# Patient Record
Sex: Female | Born: 1975
Health system: Southern US, Community
[De-identification: ages and names within clinical notes are randomized; demographics above are authoritative.]

## PROBLEM LIST (undated history)

## (undated) DIAGNOSIS — F32A Depression, unspecified: Secondary | ICD-10-CM

## (undated) DIAGNOSIS — G809 Cerebral palsy, unspecified: Secondary | ICD-10-CM

## (undated) DIAGNOSIS — J189 Pneumonia, unspecified organism: Secondary | ICD-10-CM

## (undated) DIAGNOSIS — R159 Full incontinence of feces: Secondary | ICD-10-CM

## (undated) DIAGNOSIS — F99 Mental disorder, not otherwise specified: Secondary | ICD-10-CM

## (undated) DIAGNOSIS — F329 Major depressive disorder, single episode, unspecified: Secondary | ICD-10-CM

## (undated) DIAGNOSIS — D649 Anemia, unspecified: Secondary | ICD-10-CM

## (undated) DIAGNOSIS — Z8742 Personal history of other diseases of the female genital tract: Secondary | ICD-10-CM

## (undated) DIAGNOSIS — R471 Dysarthria and anarthria: Secondary | ICD-10-CM

## (undated) DIAGNOSIS — J918 Pleural effusion in other conditions classified elsewhere: Secondary | ICD-10-CM

## (undated) DIAGNOSIS — N369 Urethral disorder, unspecified: Secondary | ICD-10-CM

## (undated) DIAGNOSIS — J9601 Acute respiratory failure with hypoxia: Secondary | ICD-10-CM

## (undated) DIAGNOSIS — Z975 Presence of (intrauterine) contraceptive device: Secondary | ICD-10-CM

## (undated) DIAGNOSIS — L0390 Cellulitis, unspecified: Secondary | ICD-10-CM

## (undated) DIAGNOSIS — F429 Obsessive-compulsive disorder, unspecified: Secondary | ICD-10-CM

## (undated) DIAGNOSIS — M1990 Unspecified osteoarthritis, unspecified site: Secondary | ICD-10-CM

## (undated) DIAGNOSIS — I10 Essential (primary) hypertension: Secondary | ICD-10-CM

## (undated) DIAGNOSIS — R310 Gross hematuria: Secondary | ICD-10-CM

## (undated) DIAGNOSIS — Z87440 Personal history of urinary (tract) infections: Secondary | ICD-10-CM

## (undated) DIAGNOSIS — R569 Unspecified convulsions: Secondary | ICD-10-CM

## (undated) DIAGNOSIS — R252 Cramp and spasm: Secondary | ICD-10-CM

## (undated) DIAGNOSIS — K219 Gastro-esophageal reflux disease without esophagitis: Secondary | ICD-10-CM

## (undated) DIAGNOSIS — Z978 Presence of other specified devices: Secondary | ICD-10-CM

## (undated) DIAGNOSIS — R404 Transient alteration of awareness: Secondary | ICD-10-CM

## (undated) DIAGNOSIS — M2450 Contracture, unspecified joint: Secondary | ICD-10-CM

## (undated) DIAGNOSIS — I2699 Other pulmonary embolism without acute cor pulmonale: Secondary | ICD-10-CM

## (undated) DIAGNOSIS — A4189 Other specified sepsis: Secondary | ICD-10-CM

## (undated) HISTORY — DX: Personal history of other diseases of the female genital tract: Z87.42

## (undated) HISTORY — DX: Major depressive disorder, single episode, unspecified: F32.9

## (undated) HISTORY — DX: Pneumonia, unspecified organism: J18.9

## (undated) HISTORY — DX: Full incontinence of feces: R15.9

## (undated) HISTORY — PX: NEUROMA SURGERY: SHX722

## (undated) HISTORY — DX: Depression, unspecified: F32.A

## (undated) HISTORY — DX: Gastro-esophageal reflux disease without esophagitis: K21.9

## (undated) HISTORY — DX: Anemia, unspecified: D64.9

## (undated) HISTORY — DX: Essential (primary) hypertension: I10

## (undated) HISTORY — DX: Cellulitis, unspecified: L03.90

## (undated) HISTORY — PX: URETHRA SURGERY: SHX824

## (undated) HISTORY — DX: Urethral disorder, unspecified: N36.9

## (undated) HISTORY — DX: Dysarthria and anarthria: R47.1

## (undated) HISTORY — DX: Contracture, unspecified joint: M24.50

## (undated) HISTORY — DX: Personal history of urinary (tract) infections: Z87.440

## (undated) HISTORY — DX: Pleural effusion in other conditions classified elsewhere: J91.8

## (undated) HISTORY — DX: Other pulmonary embolism without acute cor pulmonale: I26.99

## (undated) HISTORY — DX: Presence of (intrauterine) contraceptive device: Z97.5

## (undated) HISTORY — PX: BACLOFEN PUMP REFILL: SHX1212

## (undated) HISTORY — DX: Unspecified osteoarthritis, unspecified site: M19.90

## (undated) HISTORY — PX: ABDOMINAL HYSTERECTOMY: SHX81

## (undated) HISTORY — PX: APPENDECTOMY: SHX54

## (undated) HISTORY — DX: Unspecified convulsions: R56.9

## (undated) HISTORY — DX: Transient alteration of awareness: R40.4

## (undated) HISTORY — DX: Mental disorder, not otherwise specified: F99

## (undated) HISTORY — DX: Obsessive-compulsive disorder, unspecified: F42.9

## (undated) HISTORY — DX: Gross hematuria: R31.0

## (undated) HISTORY — DX: Acute respiratory failure with hypoxia: J96.01

## (undated) HISTORY — DX: Cramp and spasm: R25.2

## (undated) HISTORY — DX: Presence of other specified devices: Z97.8

---

## 1998-03-02 ENCOUNTER — Encounter: Admission: RE | Admit: 1998-03-02 | Discharge: 1998-05-31 | Payer: Self-pay | Admitting: *Deleted

## 1998-03-20 ENCOUNTER — Encounter: Admission: RE | Admit: 1998-03-20 | Discharge: 1998-03-20 | Payer: Self-pay | Admitting: Family Medicine

## 1998-03-30 ENCOUNTER — Encounter: Admission: RE | Admit: 1998-03-30 | Discharge: 1998-03-30 | Payer: Self-pay | Admitting: Family Medicine

## 1998-04-25 ENCOUNTER — Encounter: Admission: RE | Admit: 1998-04-25 | Discharge: 1998-04-25 | Payer: Self-pay | Admitting: Sports Medicine

## 1998-05-25 ENCOUNTER — Encounter: Admission: RE | Admit: 1998-05-25 | Discharge: 1998-05-25 | Payer: Self-pay | Admitting: Family Medicine

## 1998-06-01 ENCOUNTER — Encounter: Admission: RE | Admit: 1998-06-01 | Discharge: 1998-06-01 | Payer: Self-pay | Admitting: Family Medicine

## 1998-06-27 ENCOUNTER — Encounter: Admission: RE | Admit: 1998-06-27 | Discharge: 1998-06-27 | Payer: Self-pay | Admitting: Sports Medicine

## 1998-07-19 ENCOUNTER — Encounter: Admission: RE | Admit: 1998-07-19 | Discharge: 1998-07-19 | Payer: Self-pay | Admitting: Family Medicine

## 1998-07-26 ENCOUNTER — Encounter: Admission: RE | Admit: 1998-07-26 | Discharge: 1998-07-26 | Payer: Self-pay | Admitting: Family Medicine

## 1998-07-27 ENCOUNTER — Encounter: Admission: RE | Admit: 1998-07-27 | Discharge: 1998-07-27 | Payer: Self-pay | Admitting: Family Medicine

## 1998-09-22 ENCOUNTER — Encounter: Admission: RE | Admit: 1998-09-22 | Discharge: 1998-09-22 | Payer: Self-pay | Admitting: Family Medicine

## 1998-10-02 ENCOUNTER — Encounter: Admission: RE | Admit: 1998-10-02 | Discharge: 1998-10-02 | Payer: Self-pay | Admitting: Family Medicine

## 1998-10-05 ENCOUNTER — Encounter: Admission: RE | Admit: 1998-10-05 | Discharge: 1998-10-05 | Payer: Self-pay | Admitting: Family Medicine

## 1998-10-24 ENCOUNTER — Encounter: Admission: RE | Admit: 1998-10-24 | Discharge: 1998-10-24 | Payer: Self-pay | Admitting: Sports Medicine

## 1998-11-13 ENCOUNTER — Encounter: Admission: RE | Admit: 1998-11-13 | Discharge: 1998-11-13 | Payer: Self-pay | Admitting: Family Medicine

## 1998-11-29 ENCOUNTER — Encounter: Admission: RE | Admit: 1998-11-29 | Discharge: 1998-11-29 | Payer: Self-pay | Admitting: Sports Medicine

## 1999-01-12 ENCOUNTER — Encounter: Admission: RE | Admit: 1999-01-12 | Discharge: 1999-01-12 | Payer: Self-pay | Admitting: Family Medicine

## 1999-02-05 ENCOUNTER — Encounter: Admission: RE | Admit: 1999-02-05 | Discharge: 1999-02-05 | Payer: Self-pay | Admitting: Family Medicine

## 1999-02-06 ENCOUNTER — Encounter: Admission: RE | Admit: 1999-02-06 | Discharge: 1999-02-06 | Payer: Self-pay | Admitting: Sports Medicine

## 1999-02-09 ENCOUNTER — Encounter: Admission: RE | Admit: 1999-02-09 | Discharge: 1999-02-09 | Payer: Self-pay | Admitting: Family Medicine

## 1999-02-27 ENCOUNTER — Encounter: Admission: RE | Admit: 1999-02-27 | Discharge: 1999-02-27 | Payer: Self-pay | Admitting: Family Medicine

## 1999-02-28 ENCOUNTER — Encounter: Admission: RE | Admit: 1999-02-28 | Discharge: 1999-02-28 | Payer: Self-pay | Admitting: Family Medicine

## 1999-03-16 ENCOUNTER — Encounter: Admission: RE | Admit: 1999-03-16 | Discharge: 1999-03-16 | Payer: Self-pay | Admitting: Sports Medicine

## 1999-04-19 ENCOUNTER — Encounter: Admission: RE | Admit: 1999-04-19 | Discharge: 1999-04-19 | Payer: Self-pay | Admitting: Family Medicine

## 1999-05-18 ENCOUNTER — Observation Stay (HOSPITAL_COMMUNITY): Admission: RE | Admit: 1999-05-18 | Discharge: 1999-05-19 | Payer: Self-pay | Admitting: Pediatrics

## 1999-05-31 ENCOUNTER — Encounter: Admission: RE | Admit: 1999-05-31 | Discharge: 1999-05-31 | Payer: Self-pay | Admitting: Sports Medicine

## 1999-06-01 ENCOUNTER — Encounter: Admission: RE | Admit: 1999-06-01 | Discharge: 1999-06-01 | Payer: Self-pay | Admitting: Family Medicine

## 1999-06-08 ENCOUNTER — Encounter: Admission: RE | Admit: 1999-06-08 | Discharge: 1999-06-08 | Payer: Self-pay | Admitting: Family Medicine

## 1999-06-11 ENCOUNTER — Encounter: Admission: RE | Admit: 1999-06-11 | Discharge: 1999-06-11 | Payer: Self-pay | Admitting: Family Medicine

## 1999-06-18 ENCOUNTER — Encounter: Admission: RE | Admit: 1999-06-18 | Discharge: 1999-06-18 | Payer: Self-pay | Admitting: Family Medicine

## 1999-07-06 ENCOUNTER — Encounter: Admission: RE | Admit: 1999-07-06 | Discharge: 1999-07-06 | Payer: Self-pay | Admitting: Family Medicine

## 1999-07-09 ENCOUNTER — Inpatient Hospital Stay (HOSPITAL_COMMUNITY): Admission: RE | Admit: 1999-07-09 | Discharge: 1999-07-14 | Payer: Self-pay | Admitting: Neurosurgery

## 1999-07-18 ENCOUNTER — Encounter: Payer: Self-pay | Admitting: Neurology

## 1999-07-18 ENCOUNTER — Ambulatory Visit (HOSPITAL_COMMUNITY): Admission: RE | Admit: 1999-07-18 | Discharge: 1999-07-18 | Payer: Self-pay | Admitting: Neurology

## 1999-07-24 ENCOUNTER — Encounter: Admission: RE | Admit: 1999-07-24 | Discharge: 1999-07-24 | Payer: Self-pay | Admitting: Family Medicine

## 1999-08-07 ENCOUNTER — Encounter: Admission: RE | Admit: 1999-08-07 | Discharge: 1999-08-07 | Payer: Self-pay | Admitting: Sports Medicine

## 1999-08-25 ENCOUNTER — Encounter: Payer: Self-pay | Admitting: *Deleted

## 1999-08-25 ENCOUNTER — Emergency Department (HOSPITAL_COMMUNITY): Admission: EM | Admit: 1999-08-25 | Discharge: 1999-08-25 | Payer: Self-pay | Admitting: Emergency Medicine

## 1999-09-04 ENCOUNTER — Encounter: Admission: RE | Admit: 1999-09-04 | Discharge: 1999-12-03 | Payer: Self-pay | Admitting: Pediatrics

## 1999-09-06 ENCOUNTER — Encounter: Admission: RE | Admit: 1999-09-06 | Discharge: 1999-09-06 | Payer: Self-pay | Admitting: Family Medicine

## 1999-09-12 ENCOUNTER — Other Ambulatory Visit: Admission: RE | Admit: 1999-09-12 | Discharge: 1999-09-12 | Payer: Self-pay | Admitting: Family Medicine

## 1999-09-12 ENCOUNTER — Encounter: Admission: RE | Admit: 1999-09-12 | Discharge: 1999-09-12 | Payer: Self-pay | Admitting: Family Medicine

## 1999-09-21 ENCOUNTER — Encounter: Admission: RE | Admit: 1999-09-21 | Discharge: 1999-09-21 | Payer: Self-pay | Admitting: Family Medicine

## 1999-09-28 ENCOUNTER — Encounter: Admission: RE | Admit: 1999-09-28 | Discharge: 1999-09-28 | Payer: Self-pay | Admitting: Sports Medicine

## 1999-10-16 ENCOUNTER — Encounter: Admission: RE | Admit: 1999-10-16 | Discharge: 1999-10-16 | Payer: Self-pay | Admitting: Family Medicine

## 1999-11-06 ENCOUNTER — Encounter: Admission: RE | Admit: 1999-11-06 | Discharge: 1999-11-06 | Payer: Self-pay | Admitting: Family Medicine

## 1999-11-29 ENCOUNTER — Ambulatory Visit (HOSPITAL_COMMUNITY): Admission: RE | Admit: 1999-11-29 | Discharge: 1999-11-29 | Payer: Self-pay | Admitting: Family Medicine

## 1999-11-29 ENCOUNTER — Encounter: Payer: Self-pay | Admitting: Family Medicine

## 1999-11-29 ENCOUNTER — Encounter: Admission: RE | Admit: 1999-11-29 | Discharge: 1999-11-29 | Payer: Self-pay | Admitting: Family Medicine

## 1999-11-29 ENCOUNTER — Inpatient Hospital Stay (HOSPITAL_COMMUNITY): Admission: EM | Admit: 1999-11-29 | Discharge: 1999-12-01 | Payer: Self-pay | Admitting: Emergency Medicine

## 1999-12-06 ENCOUNTER — Encounter: Admission: RE | Admit: 1999-12-06 | Discharge: 1999-12-06 | Payer: Self-pay | Admitting: Family Medicine

## 1999-12-14 ENCOUNTER — Encounter: Admission: RE | Admit: 1999-12-14 | Discharge: 1999-12-14 | Payer: Self-pay | Admitting: Family Medicine

## 1999-12-19 ENCOUNTER — Encounter: Admission: RE | Admit: 1999-12-19 | Discharge: 1999-12-19 | Payer: Self-pay | Admitting: Family Medicine

## 1999-12-19 ENCOUNTER — Encounter: Admission: RE | Admit: 1999-12-19 | Discharge: 1999-12-19 | Payer: Self-pay | Admitting: Sports Medicine

## 1999-12-19 ENCOUNTER — Encounter: Payer: Self-pay | Admitting: Sports Medicine

## 1999-12-26 ENCOUNTER — Encounter: Admission: RE | Admit: 1999-12-26 | Discharge: 1999-12-26 | Payer: Self-pay | Admitting: Family Medicine

## 2000-01-09 ENCOUNTER — Encounter: Admission: RE | Admit: 2000-01-09 | Discharge: 2000-01-09 | Payer: Self-pay | Admitting: Family Medicine

## 2000-01-16 ENCOUNTER — Encounter: Admission: RE | Admit: 2000-01-16 | Discharge: 2000-01-16 | Payer: Self-pay | Admitting: Family Medicine

## 2000-03-18 ENCOUNTER — Encounter: Admission: RE | Admit: 2000-03-18 | Discharge: 2000-03-18 | Payer: Self-pay | Admitting: Family Medicine

## 2000-03-25 ENCOUNTER — Ambulatory Visit (HOSPITAL_COMMUNITY): Admission: RE | Admit: 2000-03-25 | Discharge: 2000-03-25 | Payer: Self-pay | Admitting: Pediatrics

## 2000-03-25 ENCOUNTER — Encounter: Payer: Self-pay | Admitting: Pediatrics

## 2000-03-27 ENCOUNTER — Encounter: Admission: RE | Admit: 2000-03-27 | Discharge: 2000-03-27 | Payer: Self-pay | Admitting: Family Medicine

## 2000-03-28 ENCOUNTER — Encounter: Admission: RE | Admit: 2000-03-28 | Discharge: 2000-06-26 | Payer: Self-pay

## 2000-04-17 ENCOUNTER — Encounter: Admission: RE | Admit: 2000-04-17 | Discharge: 2000-04-17 | Payer: Self-pay | Admitting: Family Medicine

## 2000-05-01 ENCOUNTER — Encounter: Admission: RE | Admit: 2000-05-01 | Discharge: 2000-05-01 | Payer: Self-pay | Admitting: Family Medicine

## 2000-05-19 ENCOUNTER — Other Ambulatory Visit: Admission: RE | Admit: 2000-05-19 | Discharge: 2000-05-19 | Payer: Self-pay | Admitting: Sports Medicine

## 2000-05-19 ENCOUNTER — Encounter: Admission: RE | Admit: 2000-05-19 | Discharge: 2000-05-19 | Payer: Self-pay | Admitting: Family Medicine

## 2000-06-01 HISTORY — PX: COLPOSCOPY: SHX161

## 2000-06-26 ENCOUNTER — Other Ambulatory Visit: Admission: RE | Admit: 2000-06-26 | Discharge: 2000-06-26 | Payer: Self-pay | Admitting: Family Medicine

## 2000-06-26 ENCOUNTER — Encounter: Admission: RE | Admit: 2000-06-26 | Discharge: 2000-06-26 | Payer: Self-pay | Admitting: Family Medicine

## 2000-07-10 ENCOUNTER — Encounter: Admission: RE | Admit: 2000-07-10 | Discharge: 2000-07-10 | Payer: Self-pay | Admitting: Family Medicine

## 2000-07-15 ENCOUNTER — Encounter: Payer: Self-pay | Admitting: Family Medicine

## 2000-07-15 ENCOUNTER — Encounter: Admission: RE | Admit: 2000-07-15 | Discharge: 2000-07-15 | Payer: Self-pay | Admitting: Family Medicine

## 2000-08-06 ENCOUNTER — Encounter: Admission: RE | Admit: 2000-08-06 | Discharge: 2000-11-04 | Payer: Self-pay

## 2000-08-28 ENCOUNTER — Encounter: Admission: RE | Admit: 2000-08-28 | Discharge: 2000-08-28 | Payer: Self-pay | Admitting: Family Medicine

## 2000-09-23 ENCOUNTER — Encounter: Admission: RE | Admit: 2000-09-23 | Discharge: 2000-09-23 | Payer: Self-pay | Admitting: Family Medicine

## 2000-10-06 ENCOUNTER — Encounter: Admission: RE | Admit: 2000-10-06 | Discharge: 2000-10-06 | Payer: Self-pay | Admitting: Family Medicine

## 2000-10-08 ENCOUNTER — Encounter: Admission: RE | Admit: 2000-10-08 | Discharge: 2000-10-08 | Payer: Self-pay | Admitting: Family Medicine

## 2000-11-11 ENCOUNTER — Encounter: Admission: RE | Admit: 2000-11-11 | Discharge: 2000-11-11 | Payer: Self-pay | Admitting: Obstetrics & Gynecology

## 2000-12-03 ENCOUNTER — Encounter: Admission: RE | Admit: 2000-12-03 | Discharge: 2000-12-03 | Payer: Self-pay | Admitting: Family Medicine

## 2000-12-11 ENCOUNTER — Encounter: Payer: Self-pay | Admitting: Pediatrics

## 2000-12-11 ENCOUNTER — Ambulatory Visit (HOSPITAL_COMMUNITY): Admission: RE | Admit: 2000-12-11 | Discharge: 2000-12-11 | Payer: Self-pay | Admitting: Pediatrics

## 2000-12-15 ENCOUNTER — Ambulatory Visit (HOSPITAL_COMMUNITY): Admission: RE | Admit: 2000-12-15 | Discharge: 2000-12-15 | Payer: Self-pay | Admitting: Obstetrics

## 2000-12-22 ENCOUNTER — Encounter: Admission: RE | Admit: 2000-12-22 | Discharge: 2000-12-22 | Payer: Self-pay | Admitting: Family Medicine

## 2001-01-20 ENCOUNTER — Encounter: Admission: RE | Admit: 2001-01-20 | Discharge: 2001-01-20 | Payer: Self-pay | Admitting: Sports Medicine

## 2001-02-12 ENCOUNTER — Encounter: Admission: RE | Admit: 2001-02-12 | Discharge: 2001-02-12 | Payer: Self-pay | Admitting: Sports Medicine

## 2001-03-05 ENCOUNTER — Encounter: Admission: RE | Admit: 2001-03-05 | Discharge: 2001-03-05 | Payer: Self-pay | Admitting: Family Medicine

## 2001-03-12 ENCOUNTER — Encounter: Admission: RE | Admit: 2001-03-12 | Discharge: 2001-03-12 | Payer: Self-pay | Admitting: Family Medicine

## 2001-03-24 ENCOUNTER — Encounter: Admission: RE | Admit: 2001-03-24 | Discharge: 2001-03-24 | Payer: Self-pay | Admitting: Obstetrics & Gynecology

## 2001-03-27 ENCOUNTER — Encounter: Admission: RE | Admit: 2001-03-27 | Discharge: 2001-03-27 | Payer: Self-pay | Admitting: Obstetrics & Gynecology

## 2001-05-22 ENCOUNTER — Encounter: Admission: RE | Admit: 2001-05-22 | Discharge: 2001-05-22 | Payer: Self-pay | Admitting: Obstetrics & Gynecology

## 2001-06-19 ENCOUNTER — Encounter: Admission: RE | Admit: 2001-06-19 | Discharge: 2001-06-19 | Payer: Self-pay | Admitting: Obstetrics & Gynecology

## 2001-06-23 ENCOUNTER — Encounter: Admission: RE | Admit: 2001-06-23 | Discharge: 2001-06-23 | Payer: Self-pay | Admitting: Sports Medicine

## 2001-06-29 ENCOUNTER — Encounter: Admission: RE | Admit: 2001-06-29 | Discharge: 2001-06-29 | Payer: Self-pay | Admitting: Family Medicine

## 2001-07-03 ENCOUNTER — Encounter: Admission: RE | Admit: 2001-07-03 | Discharge: 2001-10-01 | Payer: Self-pay

## 2001-09-29 ENCOUNTER — Encounter: Admission: RE | Admit: 2001-09-29 | Discharge: 2001-09-29 | Payer: Self-pay | Admitting: Sports Medicine

## 2001-10-02 ENCOUNTER — Encounter: Admission: RE | Admit: 2001-10-02 | Discharge: 2001-10-02 | Payer: Self-pay | Admitting: Obstetrics & Gynecology

## 2001-10-22 ENCOUNTER — Encounter: Payer: Self-pay | Admitting: Family Medicine

## 2001-10-22 ENCOUNTER — Inpatient Hospital Stay (HOSPITAL_COMMUNITY): Admission: EM | Admit: 2001-10-22 | Discharge: 2001-10-24 | Payer: Self-pay | Admitting: Emergency Medicine

## 2001-10-27 ENCOUNTER — Encounter: Admission: RE | Admit: 2001-10-27 | Discharge: 2001-10-27 | Payer: Self-pay | Admitting: Family Medicine

## 2001-11-03 ENCOUNTER — Encounter: Admission: RE | Admit: 2001-11-03 | Discharge: 2001-11-03 | Payer: Self-pay | Admitting: Family Medicine

## 2001-11-20 ENCOUNTER — Encounter: Admission: RE | Admit: 2001-11-20 | Discharge: 2001-11-20 | Payer: Self-pay | Admitting: Family Medicine

## 2001-11-23 ENCOUNTER — Encounter: Admission: RE | Admit: 2001-11-23 | Discharge: 2001-11-23 | Payer: Self-pay | Admitting: Sports Medicine

## 2001-11-23 ENCOUNTER — Encounter: Admission: RE | Admit: 2001-11-23 | Discharge: 2001-11-23 | Payer: Self-pay | Admitting: Pediatrics

## 2001-11-23 ENCOUNTER — Encounter: Payer: Self-pay | Admitting: Pediatrics

## 2001-11-23 ENCOUNTER — Encounter: Payer: Self-pay | Admitting: Sports Medicine

## 2001-12-02 HISTORY — PX: TUBAL LIGATION: SHX77

## 2001-12-04 ENCOUNTER — Encounter: Admission: RE | Admit: 2001-12-04 | Discharge: 2001-12-04 | Payer: Self-pay | Admitting: Family Medicine

## 2001-12-07 ENCOUNTER — Encounter: Admission: RE | Admit: 2001-12-07 | Discharge: 2001-12-07 | Payer: Self-pay | Admitting: Sports Medicine

## 2001-12-31 ENCOUNTER — Encounter: Admission: RE | Admit: 2001-12-31 | Discharge: 2001-12-31 | Payer: Self-pay | Admitting: Sports Medicine

## 2002-01-26 ENCOUNTER — Encounter (INDEPENDENT_AMBULATORY_CARE_PROVIDER_SITE_OTHER): Payer: Self-pay | Admitting: *Deleted

## 2002-01-26 ENCOUNTER — Encounter: Admission: RE | Admit: 2002-01-26 | Discharge: 2002-01-26 | Payer: Self-pay | Admitting: Sports Medicine

## 2002-01-27 ENCOUNTER — Other Ambulatory Visit: Admission: RE | Admit: 2002-01-27 | Discharge: 2002-01-27 | Payer: Self-pay | Admitting: Obstetrics

## 2002-01-29 ENCOUNTER — Encounter: Admission: RE | Admit: 2002-01-29 | Discharge: 2002-01-29 | Payer: Self-pay | Admitting: Family Medicine

## 2002-02-10 ENCOUNTER — Encounter: Admission: RE | Admit: 2002-02-10 | Discharge: 2002-02-10 | Payer: Self-pay | Admitting: Family Medicine

## 2002-02-12 ENCOUNTER — Encounter: Admission: RE | Admit: 2002-02-12 | Discharge: 2002-02-12 | Payer: Self-pay | Admitting: Family Medicine

## 2002-02-19 ENCOUNTER — Ambulatory Visit (HOSPITAL_COMMUNITY): Admission: RE | Admit: 2002-02-19 | Discharge: 2002-02-19 | Payer: Self-pay | Admitting: Family Medicine

## 2002-03-04 ENCOUNTER — Encounter: Admission: RE | Admit: 2002-03-04 | Discharge: 2002-03-04 | Payer: Self-pay | Admitting: *Deleted

## 2002-03-24 ENCOUNTER — Encounter: Admission: RE | Admit: 2002-03-24 | Discharge: 2002-03-24 | Payer: Self-pay | Admitting: *Deleted

## 2002-04-07 ENCOUNTER — Encounter: Admission: RE | Admit: 2002-04-07 | Discharge: 2002-04-07 | Payer: Self-pay | Admitting: *Deleted

## 2002-04-21 ENCOUNTER — Encounter: Admission: RE | Admit: 2002-04-21 | Discharge: 2002-04-21 | Payer: Self-pay | Admitting: *Deleted

## 2002-04-27 ENCOUNTER — Inpatient Hospital Stay (HOSPITAL_COMMUNITY): Admission: AD | Admit: 2002-04-27 | Discharge: 2002-04-27 | Payer: Self-pay | Admitting: *Deleted

## 2002-05-05 ENCOUNTER — Encounter: Admission: RE | Admit: 2002-05-05 | Discharge: 2002-05-05 | Payer: Self-pay | Admitting: *Deleted

## 2002-05-11 ENCOUNTER — Ambulatory Visit (HOSPITAL_COMMUNITY): Admission: RE | Admit: 2002-05-11 | Discharge: 2002-05-11 | Payer: Self-pay | Admitting: *Deleted

## 2002-05-19 ENCOUNTER — Encounter: Admission: RE | Admit: 2002-05-19 | Discharge: 2002-05-19 | Payer: Self-pay | Admitting: *Deleted

## 2002-06-10 ENCOUNTER — Encounter: Admission: RE | Admit: 2002-06-10 | Discharge: 2002-06-10 | Payer: Self-pay | Admitting: *Deleted

## 2002-07-01 ENCOUNTER — Encounter: Admission: RE | Admit: 2002-07-01 | Discharge: 2002-07-01 | Payer: Self-pay | Admitting: *Deleted

## 2002-07-15 ENCOUNTER — Encounter (HOSPITAL_COMMUNITY): Admission: RE | Admit: 2002-07-15 | Discharge: 2002-08-14 | Payer: Self-pay | Admitting: *Deleted

## 2002-07-15 ENCOUNTER — Encounter: Admission: RE | Admit: 2002-07-15 | Discharge: 2002-07-15 | Payer: Self-pay | Admitting: *Deleted

## 2002-07-20 ENCOUNTER — Ambulatory Visit (HOSPITAL_COMMUNITY): Admission: RE | Admit: 2002-07-20 | Discharge: 2002-07-20 | Payer: Self-pay | Admitting: *Deleted

## 2002-07-22 ENCOUNTER — Encounter: Admission: RE | Admit: 2002-07-22 | Discharge: 2002-07-22 | Payer: Self-pay | Admitting: *Deleted

## 2002-07-28 ENCOUNTER — Encounter: Admission: RE | Admit: 2002-07-28 | Discharge: 2002-08-09 | Payer: Self-pay | Admitting: *Deleted

## 2002-07-29 ENCOUNTER — Encounter: Admission: RE | Admit: 2002-07-29 | Discharge: 2002-07-29 | Payer: Self-pay | Admitting: *Deleted

## 2002-08-04 ENCOUNTER — Ambulatory Visit (HOSPITAL_COMMUNITY): Admission: RE | Admit: 2002-08-04 | Discharge: 2002-08-04 | Payer: Self-pay | Admitting: *Deleted

## 2002-08-05 ENCOUNTER — Encounter: Admission: RE | Admit: 2002-08-05 | Discharge: 2002-08-05 | Payer: Self-pay | Admitting: *Deleted

## 2002-08-12 ENCOUNTER — Encounter: Admission: RE | Admit: 2002-08-12 | Discharge: 2002-08-12 | Payer: Self-pay | Admitting: *Deleted

## 2002-08-18 ENCOUNTER — Ambulatory Visit (HOSPITAL_COMMUNITY): Admission: RE | Admit: 2002-08-18 | Discharge: 2002-08-18 | Payer: Self-pay | Admitting: *Deleted

## 2002-08-19 ENCOUNTER — Encounter: Admission: RE | Admit: 2002-08-19 | Discharge: 2002-08-19 | Payer: Self-pay | Admitting: *Deleted

## 2002-08-20 ENCOUNTER — Encounter (HOSPITAL_COMMUNITY): Admission: AD | Admit: 2002-08-20 | Discharge: 2002-09-19 | Payer: Self-pay | Admitting: *Deleted

## 2002-08-25 ENCOUNTER — Encounter: Admission: RE | Admit: 2002-08-25 | Discharge: 2002-08-25 | Payer: Self-pay | Admitting: *Deleted

## 2002-09-01 ENCOUNTER — Encounter: Admission: RE | Admit: 2002-09-01 | Discharge: 2002-09-01 | Payer: Self-pay | Admitting: *Deleted

## 2002-09-03 ENCOUNTER — Encounter: Payer: Self-pay | Admitting: *Deleted

## 2002-09-15 ENCOUNTER — Encounter: Admission: RE | Admit: 2002-09-15 | Discharge: 2002-09-15 | Payer: Self-pay | Admitting: *Deleted

## 2002-09-21 ENCOUNTER — Inpatient Hospital Stay (HOSPITAL_COMMUNITY): Admission: RE | Admit: 2002-09-21 | Discharge: 2002-09-24 | Payer: Self-pay | Admitting: *Deleted

## 2002-09-21 ENCOUNTER — Encounter (INDEPENDENT_AMBULATORY_CARE_PROVIDER_SITE_OTHER): Payer: Self-pay | Admitting: Specialist

## 2002-09-21 ENCOUNTER — Encounter (INDEPENDENT_AMBULATORY_CARE_PROVIDER_SITE_OTHER): Payer: Self-pay

## 2002-11-03 ENCOUNTER — Encounter: Admission: RE | Admit: 2002-11-03 | Discharge: 2002-11-03 | Payer: Self-pay | Admitting: Family Medicine

## 2003-05-02 ENCOUNTER — Encounter: Admission: RE | Admit: 2003-05-02 | Discharge: 2003-05-02 | Payer: Self-pay | Admitting: Sports Medicine

## 2003-07-29 ENCOUNTER — Encounter: Admission: RE | Admit: 2003-07-29 | Discharge: 2003-07-29 | Payer: Self-pay | Admitting: Family Medicine

## 2003-08-05 ENCOUNTER — Encounter: Admission: RE | Admit: 2003-08-05 | Discharge: 2003-08-05 | Payer: Self-pay | Admitting: Family Medicine

## 2003-12-08 ENCOUNTER — Encounter: Admission: RE | Admit: 2003-12-08 | Discharge: 2003-12-08 | Payer: Self-pay | Admitting: Family Medicine

## 2003-12-29 ENCOUNTER — Emergency Department (HOSPITAL_COMMUNITY): Admission: AD | Admit: 2003-12-29 | Discharge: 2003-12-29 | Payer: Self-pay | Admitting: Family Medicine

## 2004-03-28 ENCOUNTER — Encounter: Admission: RE | Admit: 2004-03-28 | Discharge: 2004-03-28 | Payer: Self-pay | Admitting: Family Medicine

## 2004-04-20 ENCOUNTER — Encounter: Admission: RE | Admit: 2004-04-20 | Discharge: 2004-04-20 | Payer: Self-pay | Admitting: Sports Medicine

## 2004-05-03 ENCOUNTER — Emergency Department (HOSPITAL_COMMUNITY): Admission: EM | Admit: 2004-05-03 | Discharge: 2004-05-03 | Payer: Self-pay | Admitting: Family Medicine

## 2004-05-10 ENCOUNTER — Encounter: Admission: RE | Admit: 2004-05-10 | Discharge: 2004-05-10 | Payer: Self-pay | Admitting: Family Medicine

## 2004-07-23 ENCOUNTER — Encounter: Admission: RE | Admit: 2004-07-23 | Discharge: 2004-07-23 | Payer: Self-pay | Admitting: Family Medicine

## 2004-08-24 ENCOUNTER — Ambulatory Visit: Payer: Self-pay | Admitting: Family Medicine

## 2004-08-24 ENCOUNTER — Other Ambulatory Visit: Admission: RE | Admit: 2004-08-24 | Discharge: 2004-08-24 | Payer: Self-pay | Admitting: Family Medicine

## 2004-09-24 ENCOUNTER — Ambulatory Visit: Payer: Self-pay | Admitting: Family Medicine

## 2004-10-02 ENCOUNTER — Inpatient Hospital Stay (HOSPITAL_COMMUNITY): Admission: AD | Admit: 2004-10-02 | Discharge: 2004-10-10 | Payer: Self-pay | Admitting: Family Medicine

## 2004-10-02 ENCOUNTER — Ambulatory Visit: Payer: Self-pay | Admitting: Physical Medicine & Rehabilitation

## 2004-10-02 ENCOUNTER — Ambulatory Visit: Payer: Self-pay | Admitting: Family Medicine

## 2004-10-10 ENCOUNTER — Inpatient Hospital Stay (HOSPITAL_COMMUNITY)
Admission: RE | Admit: 2004-10-10 | Discharge: 2004-10-26 | Payer: Self-pay | Admitting: Physical Medicine & Rehabilitation

## 2004-11-06 ENCOUNTER — Ambulatory Visit: Payer: Self-pay | Admitting: Sports Medicine

## 2004-11-12 ENCOUNTER — Encounter
Admission: RE | Admit: 2004-11-12 | Discharge: 2005-02-10 | Payer: Self-pay | Admitting: Physical Medicine & Rehabilitation

## 2004-12-06 ENCOUNTER — Ambulatory Visit: Payer: Self-pay | Admitting: Family Medicine

## 2005-02-12 ENCOUNTER — Ambulatory Visit: Payer: Self-pay | Admitting: Sports Medicine

## 2005-02-12 ENCOUNTER — Ambulatory Visit: Payer: Self-pay | Admitting: Family Medicine

## 2005-02-12 ENCOUNTER — Inpatient Hospital Stay (HOSPITAL_COMMUNITY): Admission: AD | Admit: 2005-02-12 | Discharge: 2005-02-21 | Payer: Self-pay | Admitting: Sports Medicine

## 2005-02-27 ENCOUNTER — Ambulatory Visit: Payer: Self-pay | Admitting: Family Medicine

## 2005-03-05 ENCOUNTER — Ambulatory Visit: Payer: Self-pay | Admitting: Family Medicine

## 2005-03-18 ENCOUNTER — Encounter: Admission: RE | Admit: 2005-03-18 | Discharge: 2005-06-16 | Payer: Self-pay | Admitting: Family Medicine

## 2005-03-26 ENCOUNTER — Ambulatory Visit: Payer: Self-pay | Admitting: Sports Medicine

## 2005-05-27 ENCOUNTER — Ambulatory Visit: Payer: Self-pay | Admitting: Sports Medicine

## 2005-06-03 ENCOUNTER — Emergency Department (HOSPITAL_COMMUNITY): Admission: EM | Admit: 2005-06-03 | Discharge: 2005-06-03 | Payer: Self-pay | Admitting: Emergency Medicine

## 2005-06-06 ENCOUNTER — Ambulatory Visit: Payer: Self-pay | Admitting: Family Medicine

## 2005-06-10 ENCOUNTER — Ambulatory Visit: Payer: Self-pay | Admitting: Family Medicine

## 2005-06-10 ENCOUNTER — Inpatient Hospital Stay (HOSPITAL_COMMUNITY): Admission: AD | Admit: 2005-06-10 | Discharge: 2005-06-13 | Payer: Self-pay | Admitting: Family Medicine

## 2005-06-18 ENCOUNTER — Ambulatory Visit: Payer: Self-pay | Admitting: Family Medicine

## 2005-07-10 ENCOUNTER — Ambulatory Visit: Payer: Self-pay | Admitting: Family Medicine

## 2005-08-01 ENCOUNTER — Ambulatory Visit: Payer: Self-pay | Admitting: Family Medicine

## 2005-09-18 ENCOUNTER — Ambulatory Visit: Payer: Self-pay | Admitting: Family Medicine

## 2006-06-10 ENCOUNTER — Ambulatory Visit: Payer: Self-pay | Admitting: Sports Medicine

## 2006-07-23 ENCOUNTER — Ambulatory Visit: Payer: Self-pay | Admitting: Sports Medicine

## 2006-08-23 ENCOUNTER — Emergency Department (HOSPITAL_COMMUNITY): Admission: EM | Admit: 2006-08-23 | Discharge: 2006-08-24 | Payer: Self-pay | Admitting: Emergency Medicine

## 2006-08-27 ENCOUNTER — Ambulatory Visit (HOSPITAL_COMMUNITY): Admission: RE | Admit: 2006-08-27 | Discharge: 2006-08-27 | Payer: Self-pay | Admitting: Pediatrics

## 2006-09-08 ENCOUNTER — Inpatient Hospital Stay (HOSPITAL_COMMUNITY): Admission: AD | Admit: 2006-09-08 | Discharge: 2006-09-12 | Payer: Self-pay | Admitting: *Deleted

## 2006-09-08 ENCOUNTER — Ambulatory Visit: Payer: Self-pay | Admitting: Internal Medicine

## 2006-09-08 DIAGNOSIS — R404 Transient alteration of awareness: Secondary | ICD-10-CM

## 2006-09-08 HISTORY — DX: Transient alteration of awareness: R40.4

## 2006-09-11 ENCOUNTER — Encounter: Payer: Self-pay | Admitting: Cardiology

## 2006-09-29 ENCOUNTER — Ambulatory Visit: Payer: Self-pay | Admitting: Internal Medicine

## 2006-10-02 ENCOUNTER — Encounter (INDEPENDENT_AMBULATORY_CARE_PROVIDER_SITE_OTHER): Payer: Self-pay | Admitting: *Deleted

## 2006-10-31 ENCOUNTER — Ambulatory Visit: Payer: Self-pay | Admitting: Family Medicine

## 2006-10-31 ENCOUNTER — Other Ambulatory Visit: Admission: RE | Admit: 2006-10-31 | Discharge: 2006-10-31 | Payer: Self-pay | Admitting: Family Medicine

## 2006-11-04 ENCOUNTER — Emergency Department (HOSPITAL_COMMUNITY): Admission: EM | Admit: 2006-11-04 | Discharge: 2006-11-04 | Payer: Self-pay | Admitting: Emergency Medicine

## 2006-11-05 ENCOUNTER — Ambulatory Visit: Payer: Self-pay | Admitting: Family Medicine

## 2006-11-07 ENCOUNTER — Emergency Department (HOSPITAL_COMMUNITY): Admission: EM | Admit: 2006-11-07 | Discharge: 2006-11-07 | Payer: Self-pay | Admitting: Emergency Medicine

## 2006-11-11 ENCOUNTER — Ambulatory Visit: Payer: Self-pay | Admitting: Internal Medicine

## 2006-11-12 ENCOUNTER — Ambulatory Visit: Payer: Self-pay | Admitting: Psychiatry

## 2006-11-12 ENCOUNTER — Inpatient Hospital Stay (HOSPITAL_COMMUNITY): Admission: RE | Admit: 2006-11-12 | Discharge: 2006-11-21 | Payer: Self-pay | Admitting: Psychiatry

## 2006-12-29 ENCOUNTER — Ambulatory Visit: Payer: Self-pay | Admitting: Internal Medicine

## 2007-01-15 ENCOUNTER — Ambulatory Visit: Payer: Self-pay | Admitting: Internal Medicine

## 2007-01-21 ENCOUNTER — Ambulatory Visit: Payer: Self-pay | Admitting: Family Medicine

## 2007-01-29 ENCOUNTER — Ambulatory Visit: Payer: Self-pay | Admitting: Family Medicine

## 2007-01-29 ENCOUNTER — Encounter (INDEPENDENT_AMBULATORY_CARE_PROVIDER_SITE_OTHER): Payer: Self-pay | Admitting: Family Medicine

## 2007-01-29 DIAGNOSIS — F339 Major depressive disorder, recurrent, unspecified: Secondary | ICD-10-CM | POA: Insufficient documentation

## 2007-01-29 DIAGNOSIS — G808 Other cerebral palsy: Secondary | ICD-10-CM

## 2007-01-29 DIAGNOSIS — F428 Other obsessive-compulsive disorder: Secondary | ICD-10-CM

## 2007-01-29 DIAGNOSIS — F429 Obsessive-compulsive disorder, unspecified: Secondary | ICD-10-CM

## 2007-01-29 DIAGNOSIS — G809 Cerebral palsy, unspecified: Secondary | ICD-10-CM | POA: Insufficient documentation

## 2007-01-29 LAB — CONVERTED CEMR LAB
Cholesterol: 150 mg/dL (ref 0–200)
HDL: 41 mg/dL (ref >39)
Triglycerides: 83 mg/dL (ref <150)

## 2007-01-30 ENCOUNTER — Encounter (INDEPENDENT_AMBULATORY_CARE_PROVIDER_SITE_OTHER): Payer: Self-pay | Admitting: *Deleted

## 2007-02-04 ENCOUNTER — Encounter (INDEPENDENT_AMBULATORY_CARE_PROVIDER_SITE_OTHER): Payer: Self-pay | Admitting: Family Medicine

## 2007-04-28 ENCOUNTER — Telehealth (INDEPENDENT_AMBULATORY_CARE_PROVIDER_SITE_OTHER): Payer: Self-pay | Admitting: Family Medicine

## 2007-05-06 ENCOUNTER — Encounter: Payer: Self-pay | Admitting: *Deleted

## 2007-05-20 ENCOUNTER — Telehealth: Payer: Self-pay | Admitting: *Deleted

## 2007-05-21 ENCOUNTER — Ambulatory Visit: Payer: Self-pay | Admitting: Sports Medicine

## 2007-06-09 ENCOUNTER — Telehealth: Payer: Self-pay | Admitting: *Deleted

## 2007-06-10 ENCOUNTER — Ambulatory Visit: Payer: Self-pay | Admitting: Family Medicine

## 2007-06-15 ENCOUNTER — Inpatient Hospital Stay (HOSPITAL_COMMUNITY): Admission: RE | Admit: 2007-06-15 | Discharge: 2007-06-16 | Payer: Self-pay | Admitting: Neurosurgery

## 2007-08-27 ENCOUNTER — Telehealth (INDEPENDENT_AMBULATORY_CARE_PROVIDER_SITE_OTHER): Payer: Self-pay | Admitting: *Deleted

## 2007-08-28 ENCOUNTER — Ambulatory Visit: Payer: Self-pay | Admitting: Family Medicine

## 2007-08-28 ENCOUNTER — Encounter (INDEPENDENT_AMBULATORY_CARE_PROVIDER_SITE_OTHER): Payer: Self-pay | Admitting: Family Medicine

## 2007-08-28 LAB — CONVERTED CEMR LAB
Basophils Absolute: 0 10*3/uL (ref 0.0–0.1)
Basophils Relative: 0 % (ref 0–1)
Bilirubin Urine: NEGATIVE
Eosinophils Absolute: 0.2 10*3/uL (ref 0.0–0.7)
Glucose, Urine, Semiquant: NEGATIVE
Lymphs Abs: 2.3 10*3/uL (ref 0.7–3.3)
MCHC: 31.4 g/dL (ref 30.0–36.0)
MCV: 90 fL (ref 78.0–100.0)
Neutrophils Relative %: 49 % (ref 43–77)
Platelets: 253 10*3/uL (ref 150–400)
Protein, U semiquant: NEGATIVE
TSH: 0.695 microintl units/mL (ref 0.350–5.50)
WBC: 6 10*3/uL (ref 4.0–10.5)
pH: 6

## 2007-09-02 ENCOUNTER — Ambulatory Visit: Payer: Self-pay | Admitting: Family Medicine

## 2007-09-02 ENCOUNTER — Encounter (INDEPENDENT_AMBULATORY_CARE_PROVIDER_SITE_OTHER): Payer: Self-pay | Admitting: Family Medicine

## 2007-09-09 ENCOUNTER — Ambulatory Visit: Payer: Self-pay | Admitting: Family Medicine

## 2007-09-09 ENCOUNTER — Telehealth: Payer: Self-pay | Admitting: *Deleted

## 2007-10-13 ENCOUNTER — Emergency Department (HOSPITAL_COMMUNITY): Admission: EM | Admit: 2007-10-13 | Discharge: 2007-10-14 | Payer: Self-pay | Admitting: Emergency Medicine

## 2007-11-11 ENCOUNTER — Telehealth (INDEPENDENT_AMBULATORY_CARE_PROVIDER_SITE_OTHER): Payer: Self-pay | Admitting: Family Medicine

## 2007-12-02 ENCOUNTER — Ambulatory Visit (HOSPITAL_COMMUNITY): Admission: RE | Admit: 2007-12-02 | Discharge: 2007-12-02 | Payer: Self-pay | Admitting: Pediatrics

## 2007-12-07 ENCOUNTER — Inpatient Hospital Stay (HOSPITAL_COMMUNITY): Admission: RE | Admit: 2007-12-07 | Discharge: 2007-12-08 | Payer: Self-pay | Admitting: Neurosurgery

## 2007-12-15 ENCOUNTER — Telehealth: Payer: Self-pay | Admitting: *Deleted

## 2008-01-15 ENCOUNTER — Ambulatory Visit: Payer: Self-pay | Admitting: Family Medicine

## 2008-02-08 ENCOUNTER — Telehealth (INDEPENDENT_AMBULATORY_CARE_PROVIDER_SITE_OTHER): Payer: Self-pay | Admitting: Family Medicine

## 2008-02-08 ENCOUNTER — Ambulatory Visit: Payer: Self-pay | Admitting: Family Medicine

## 2008-02-08 LAB — CONVERTED CEMR LAB
Glucose, Urine, Semiquant: NEGATIVE
Specific Gravity, Urine: 1.01
WBC Urine, dipstick: NEGATIVE
pH: 7

## 2008-02-09 ENCOUNTER — Encounter (INDEPENDENT_AMBULATORY_CARE_PROVIDER_SITE_OTHER): Payer: Self-pay | Admitting: Family Medicine

## 2008-03-18 ENCOUNTER — Ambulatory Visit: Payer: Self-pay | Admitting: Family Medicine

## 2008-05-02 ENCOUNTER — Telehealth: Payer: Self-pay | Admitting: *Deleted

## 2008-05-02 ENCOUNTER — Ambulatory Visit: Payer: Self-pay | Admitting: Family Medicine

## 2008-05-17 ENCOUNTER — Ambulatory Visit: Payer: Self-pay | Admitting: Family Medicine

## 2008-05-18 ENCOUNTER — Telehealth (INDEPENDENT_AMBULATORY_CARE_PROVIDER_SITE_OTHER): Payer: Self-pay | Admitting: *Deleted

## 2008-05-19 ENCOUNTER — Encounter (INDEPENDENT_AMBULATORY_CARE_PROVIDER_SITE_OTHER): Payer: Self-pay | Admitting: Family Medicine

## 2008-06-01 ENCOUNTER — Ambulatory Visit: Payer: Self-pay | Admitting: Internal Medicine

## 2008-06-14 ENCOUNTER — Ambulatory Visit: Payer: Self-pay | Admitting: Family Medicine

## 2008-06-15 ENCOUNTER — Telehealth: Payer: Self-pay | Admitting: *Deleted

## 2008-06-16 ENCOUNTER — Ambulatory Visit: Payer: Self-pay | Admitting: Family Medicine

## 2008-06-16 ENCOUNTER — Telehealth (INDEPENDENT_AMBULATORY_CARE_PROVIDER_SITE_OTHER): Payer: Self-pay | Admitting: *Deleted

## 2008-06-21 ENCOUNTER — Encounter: Payer: Self-pay | Admitting: Family Medicine

## 2008-06-21 ENCOUNTER — Ambulatory Visit: Payer: Self-pay | Admitting: Family Medicine

## 2008-06-24 ENCOUNTER — Encounter: Payer: Self-pay | Admitting: Family Medicine

## 2008-06-24 ENCOUNTER — Ambulatory Visit (HOSPITAL_COMMUNITY): Admission: RE | Admit: 2008-06-24 | Discharge: 2008-06-24 | Payer: Self-pay | Admitting: Family Medicine

## 2008-07-06 ENCOUNTER — Ambulatory Visit: Payer: Self-pay | Admitting: Family Medicine

## 2008-07-20 ENCOUNTER — Encounter (INDEPENDENT_AMBULATORY_CARE_PROVIDER_SITE_OTHER): Payer: Self-pay | Admitting: Family Medicine

## 2008-07-20 ENCOUNTER — Ambulatory Visit: Payer: Self-pay | Admitting: Family Medicine

## 2008-07-20 ENCOUNTER — Telehealth: Payer: Self-pay | Admitting: *Deleted

## 2008-07-20 LAB — CONVERTED CEMR LAB
Nitrite: NEGATIVE
Specific Gravity, Urine: 1.015
WBC Urine, dipstick: NEGATIVE

## 2008-07-22 ENCOUNTER — Ambulatory Visit: Payer: Self-pay | Admitting: Family Medicine

## 2008-07-22 ENCOUNTER — Telehealth: Payer: Self-pay | Admitting: *Deleted

## 2008-07-25 ENCOUNTER — Ambulatory Visit: Payer: Self-pay | Admitting: Sports Medicine

## 2008-07-27 ENCOUNTER — Telehealth: Payer: Self-pay | Admitting: *Deleted

## 2008-08-02 HISTORY — PX: CARPAL TUNNEL RELEASE: SHX101

## 2008-08-05 ENCOUNTER — Ambulatory Visit: Payer: Self-pay | Admitting: Family Medicine

## 2008-08-05 ENCOUNTER — Encounter: Payer: Self-pay | Admitting: Family Medicine

## 2008-08-10 ENCOUNTER — Telehealth: Payer: Self-pay | Admitting: *Deleted

## 2008-08-11 ENCOUNTER — Ambulatory Visit: Payer: Self-pay | Admitting: Obstetrics & Gynecology

## 2008-08-11 ENCOUNTER — Telehealth: Payer: Self-pay | Admitting: *Deleted

## 2008-08-12 ENCOUNTER — Ambulatory Visit: Payer: Self-pay | Admitting: Family Medicine

## 2008-08-17 ENCOUNTER — Encounter: Payer: Self-pay | Admitting: Family Medicine

## 2008-09-01 ENCOUNTER — Ambulatory Visit (HOSPITAL_BASED_OUTPATIENT_CLINIC_OR_DEPARTMENT_OTHER): Admission: RE | Admit: 2008-09-01 | Discharge: 2008-09-01 | Payer: Self-pay | Admitting: Orthopedic Surgery

## 2008-09-14 ENCOUNTER — Telehealth: Payer: Self-pay | Admitting: Family Medicine

## 2008-09-19 ENCOUNTER — Telehealth: Payer: Self-pay | Admitting: Family Medicine

## 2008-09-20 ENCOUNTER — Encounter: Payer: Self-pay | Admitting: Family Medicine

## 2008-09-29 ENCOUNTER — Inpatient Hospital Stay (HOSPITAL_COMMUNITY): Admission: RE | Admit: 2008-09-29 | Discharge: 2008-10-03 | Payer: Self-pay | Admitting: Psychiatry

## 2008-09-29 ENCOUNTER — Ambulatory Visit: Payer: Self-pay | Admitting: Psychiatry

## 2008-09-29 ENCOUNTER — Telehealth: Payer: Self-pay | Admitting: Family Medicine

## 2008-10-05 ENCOUNTER — Encounter: Payer: Self-pay | Admitting: Family Medicine

## 2008-10-07 ENCOUNTER — Telehealth: Payer: Self-pay | Admitting: Family Medicine

## 2008-10-10 ENCOUNTER — Encounter: Payer: Self-pay | Admitting: *Deleted

## 2008-10-18 ENCOUNTER — Telehealth (INDEPENDENT_AMBULATORY_CARE_PROVIDER_SITE_OTHER): Payer: Self-pay | Admitting: *Deleted

## 2008-10-21 ENCOUNTER — Telehealth: Payer: Self-pay | Admitting: *Deleted

## 2008-10-24 ENCOUNTER — Ambulatory Visit: Payer: Self-pay | Admitting: Family Medicine

## 2008-11-04 ENCOUNTER — Ambulatory Visit: Payer: Self-pay | Admitting: Family Medicine

## 2008-12-26 ENCOUNTER — Telehealth: Payer: Self-pay | Admitting: Family Medicine

## 2008-12-27 ENCOUNTER — Ambulatory Visit: Payer: Self-pay | Admitting: Family Medicine

## 2008-12-27 DIAGNOSIS — M2450 Contracture, unspecified joint: Secondary | ICD-10-CM

## 2008-12-27 DIAGNOSIS — G801 Spastic diplegic cerebral palsy: Secondary | ICD-10-CM | POA: Insufficient documentation

## 2008-12-30 ENCOUNTER — Telehealth: Payer: Self-pay | Admitting: Family Medicine

## 2008-12-30 ENCOUNTER — Encounter: Payer: Self-pay | Admitting: Family Medicine

## 2008-12-30 ENCOUNTER — Ambulatory Visit: Payer: Self-pay | Admitting: Family Medicine

## 2008-12-30 LAB — CONVERTED CEMR LAB
Nitrite: NEGATIVE
Protein, U semiquant: NEGATIVE
Urobilinogen, UA: 0.2

## 2009-01-23 ENCOUNTER — Encounter: Admission: RE | Admit: 2009-01-23 | Discharge: 2009-03-15 | Payer: Self-pay | Admitting: Family Medicine

## 2009-01-25 ENCOUNTER — Ambulatory Visit: Payer: Self-pay | Admitting: Family Medicine

## 2009-02-22 ENCOUNTER — Encounter: Payer: Self-pay | Admitting: Family Medicine

## 2009-02-22 ENCOUNTER — Ambulatory Visit: Payer: Self-pay | Admitting: Family Medicine

## 2009-02-22 ENCOUNTER — Other Ambulatory Visit: Admission: RE | Admit: 2009-02-22 | Discharge: 2009-02-22 | Payer: Self-pay | Admitting: Family Medicine

## 2009-02-22 LAB — CONVERTED CEMR LAB
Bilirubin Urine: NEGATIVE
GC Probe Amp, Genital: NEGATIVE
Glucose, Urine, Semiquant: NEGATIVE
Protein, U semiquant: NEGATIVE
Specific Gravity, Urine: 1.01
pH: 5.5

## 2009-03-10 ENCOUNTER — Telehealth: Payer: Self-pay | Admitting: Family Medicine

## 2009-03-10 ENCOUNTER — Emergency Department (HOSPITAL_COMMUNITY): Admission: EM | Admit: 2009-03-10 | Discharge: 2009-03-10 | Payer: Self-pay | Admitting: Family Medicine

## 2009-03-14 ENCOUNTER — Telehealth: Payer: Self-pay | Admitting: Family Medicine

## 2009-03-15 ENCOUNTER — Ambulatory Visit: Payer: Self-pay | Admitting: Family Medicine

## 2009-03-15 ENCOUNTER — Encounter: Payer: Self-pay | Admitting: Family Medicine

## 2009-03-16 ENCOUNTER — Telehealth: Payer: Self-pay | Admitting: *Deleted

## 2009-03-27 ENCOUNTER — Ambulatory Visit: Payer: Self-pay | Admitting: Family Medicine

## 2009-04-13 ENCOUNTER — Telehealth: Payer: Self-pay | Admitting: *Deleted

## 2009-05-11 ENCOUNTER — Telehealth: Payer: Self-pay | Admitting: Family Medicine

## 2009-05-12 ENCOUNTER — Ambulatory Visit: Payer: Self-pay | Admitting: Family Medicine

## 2009-06-09 ENCOUNTER — Telehealth: Payer: Self-pay | Admitting: Family Medicine

## 2009-06-09 ENCOUNTER — Emergency Department (HOSPITAL_COMMUNITY): Admission: EM | Admit: 2009-06-09 | Discharge: 2009-06-09 | Payer: Self-pay | Admitting: Emergency Medicine

## 2009-06-14 ENCOUNTER — Ambulatory Visit: Payer: Self-pay | Admitting: Family Medicine

## 2009-06-15 ENCOUNTER — Encounter: Admission: RE | Admit: 2009-06-15 | Discharge: 2009-06-15 | Payer: Self-pay | Admitting: Sports Medicine

## 2009-06-22 ENCOUNTER — Telehealth: Payer: Self-pay | Admitting: Family Medicine

## 2009-06-26 ENCOUNTER — Telehealth: Payer: Self-pay | Admitting: Internal Medicine

## 2009-07-03 ENCOUNTER — Telehealth: Payer: Self-pay | Admitting: Family Medicine

## 2009-07-03 ENCOUNTER — Ambulatory Visit: Payer: Self-pay | Admitting: Family Medicine

## 2009-07-11 ENCOUNTER — Encounter: Payer: Self-pay | Admitting: Family Medicine

## 2009-07-11 LAB — CONVERTED CEMR LAB
Calcium: 9.3 mg/dL
HCT: 36.6 %
Hemoglobin: 12.4 g/dL
Platelets: 291 10*3/uL
Potassium: 3.9 meq/L
Sodium: 141 meq/L
WBC: 7.9 10*3/uL

## 2009-07-18 ENCOUNTER — Ambulatory Visit (HOSPITAL_COMMUNITY): Admission: RE | Admit: 2009-07-18 | Discharge: 2009-07-19 | Payer: Self-pay | Admitting: Oral Surgery

## 2009-07-25 ENCOUNTER — Ambulatory Visit: Payer: Self-pay | Admitting: Family Medicine

## 2009-07-25 ENCOUNTER — Encounter: Payer: Self-pay | Admitting: Family Medicine

## 2009-07-25 LAB — CONVERTED CEMR LAB
Blood in Urine, dipstick: NEGATIVE
CO2: 19 meq/L (ref 19–32)
Calcium: 9.3 mg/dL (ref 8.4–10.5)
Chloride: 107 meq/L (ref 96–112)
Glucose, Bld: 106 mg/dL — ABNORMAL HIGH (ref 70–99)
Ketones, urine, test strip: NEGATIVE
Nitrite: NEGATIVE
Potassium: 4 meq/L (ref 3.5–5.3)
Protein, U semiquant: NEGATIVE
Sodium: 140 meq/L (ref 135–145)
Specific Gravity, Urine: 1.025
Urobilinogen, UA: 2
WBC Urine, dipstick: NEGATIVE

## 2009-07-28 ENCOUNTER — Encounter: Payer: Self-pay | Admitting: Family Medicine

## 2009-07-31 ENCOUNTER — Telehealth: Payer: Self-pay | Admitting: Internal Medicine

## 2009-07-31 ENCOUNTER — Emergency Department (HOSPITAL_COMMUNITY): Admission: EM | Admit: 2009-07-31 | Discharge: 2009-07-31 | Payer: Self-pay | Admitting: Emergency Medicine

## 2009-08-01 ENCOUNTER — Telehealth: Payer: Self-pay | Admitting: Family Medicine

## 2009-08-02 ENCOUNTER — Encounter: Payer: Self-pay | Admitting: Family Medicine

## 2009-08-02 ENCOUNTER — Ambulatory Visit: Payer: Self-pay | Admitting: Family Medicine

## 2009-08-02 ENCOUNTER — Inpatient Hospital Stay (HOSPITAL_COMMUNITY): Admission: AD | Admit: 2009-08-02 | Discharge: 2009-08-05 | Payer: Self-pay | Admitting: Family Medicine

## 2009-08-03 ENCOUNTER — Ambulatory Visit: Payer: Self-pay | Admitting: Surgery

## 2009-08-07 ENCOUNTER — Telehealth: Payer: Self-pay | Admitting: Family Medicine

## 2009-08-08 ENCOUNTER — Telehealth: Payer: Self-pay | Admitting: Family Medicine

## 2009-08-09 ENCOUNTER — Ambulatory Visit: Payer: Self-pay | Admitting: Family Medicine

## 2009-08-10 ENCOUNTER — Telehealth: Payer: Self-pay | Admitting: *Deleted

## 2009-08-10 ENCOUNTER — Telehealth: Payer: Self-pay | Admitting: Family Medicine

## 2009-08-11 ENCOUNTER — Emergency Department (HOSPITAL_COMMUNITY): Admission: EM | Admit: 2009-08-11 | Discharge: 2009-08-11 | Payer: Self-pay | Admitting: Emergency Medicine

## 2009-08-11 ENCOUNTER — Encounter: Payer: Self-pay | Admitting: Family Medicine

## 2009-08-14 ENCOUNTER — Ambulatory Visit: Payer: Self-pay | Admitting: Family Medicine

## 2009-08-21 ENCOUNTER — Ambulatory Visit: Payer: Self-pay | Admitting: Family Medicine

## 2009-08-21 ENCOUNTER — Encounter: Payer: Self-pay | Admitting: Family Medicine

## 2009-08-24 ENCOUNTER — Ambulatory Visit: Payer: Self-pay | Admitting: Family Medicine

## 2009-08-24 ENCOUNTER — Encounter: Payer: Self-pay | Admitting: Family Medicine

## 2009-08-25 ENCOUNTER — Encounter: Payer: Self-pay | Admitting: Family Medicine

## 2009-08-25 ENCOUNTER — Observation Stay (HOSPITAL_COMMUNITY): Admission: EM | Admit: 2009-08-25 | Discharge: 2009-08-26 | Payer: Self-pay | Admitting: Emergency Medicine

## 2009-08-28 ENCOUNTER — Encounter: Payer: Self-pay | Admitting: *Deleted

## 2009-08-28 ENCOUNTER — Ambulatory Visit: Payer: Self-pay | Admitting: Family Medicine

## 2009-09-01 ENCOUNTER — Encounter: Payer: Self-pay | Admitting: Family Medicine

## 2009-09-08 ENCOUNTER — Telehealth: Payer: Self-pay | Admitting: Family Medicine

## 2009-09-08 ENCOUNTER — Ambulatory Visit: Payer: Self-pay | Admitting: Family Medicine

## 2009-09-11 ENCOUNTER — Ambulatory Visit: Payer: Self-pay | Admitting: Family Medicine

## 2009-09-11 LAB — CONVERTED CEMR LAB: INR: 2.3

## 2009-09-20 ENCOUNTER — Ambulatory Visit: Payer: Self-pay | Admitting: Family Medicine

## 2009-09-20 LAB — CONVERTED CEMR LAB: INR: 1.8

## 2009-09-22 ENCOUNTER — Telehealth: Payer: Self-pay | Admitting: *Deleted

## 2009-09-22 ENCOUNTER — Telehealth: Payer: Self-pay | Admitting: Family Medicine

## 2009-10-05 ENCOUNTER — Ambulatory Visit: Payer: Self-pay | Admitting: Family Medicine

## 2009-10-05 LAB — CONVERTED CEMR LAB: INR: 2.5

## 2009-10-13 ENCOUNTER — Encounter: Admission: RE | Admit: 2009-10-13 | Discharge: 2009-11-30 | Payer: Self-pay | Admitting: Family Medicine

## 2009-10-19 ENCOUNTER — Ambulatory Visit: Payer: Self-pay | Admitting: Family Medicine

## 2009-10-19 ENCOUNTER — Encounter: Payer: Self-pay | Admitting: Family Medicine

## 2009-10-23 ENCOUNTER — Encounter: Admission: RE | Admit: 2009-10-23 | Discharge: 2009-11-30 | Payer: Self-pay | Admitting: Family Medicine

## 2009-11-02 ENCOUNTER — Encounter: Payer: Self-pay | Admitting: Family Medicine

## 2009-11-09 ENCOUNTER — Telehealth: Payer: Self-pay | Admitting: Family Medicine

## 2009-11-09 ENCOUNTER — Ambulatory Visit: Payer: Self-pay | Admitting: Family Medicine

## 2009-11-09 LAB — CONVERTED CEMR LAB: INR: 1.6

## 2009-11-22 ENCOUNTER — Ambulatory Visit: Payer: Self-pay | Admitting: Family Medicine

## 2009-11-22 LAB — CONVERTED CEMR LAB: INR: 2.7

## 2009-12-06 ENCOUNTER — Ambulatory Visit: Payer: Self-pay | Admitting: Family Medicine

## 2009-12-06 ENCOUNTER — Telehealth: Payer: Self-pay | Admitting: Family Medicine

## 2009-12-12 ENCOUNTER — Encounter: Admission: RE | Admit: 2009-12-12 | Discharge: 2009-12-22 | Payer: Self-pay | Admitting: Family Medicine

## 2009-12-19 ENCOUNTER — Encounter: Payer: Self-pay | Admitting: Family Medicine

## 2009-12-20 ENCOUNTER — Ambulatory Visit: Payer: Self-pay | Admitting: Family Medicine

## 2009-12-20 ENCOUNTER — Encounter: Payer: Self-pay | Admitting: Family Medicine

## 2009-12-21 ENCOUNTER — Encounter: Payer: Self-pay | Admitting: Family Medicine

## 2010-01-02 ENCOUNTER — Telehealth: Payer: Self-pay | Admitting: *Deleted

## 2010-01-03 ENCOUNTER — Encounter: Payer: Self-pay | Admitting: Family Medicine

## 2010-01-03 ENCOUNTER — Ambulatory Visit: Payer: Self-pay | Admitting: Family Medicine

## 2010-01-03 LAB — CONVERTED CEMR LAB: INR: 2

## 2010-01-19 ENCOUNTER — Ambulatory Visit: Payer: Self-pay | Admitting: Family Medicine

## 2010-01-19 ENCOUNTER — Ambulatory Visit: Payer: Self-pay | Admitting: Sports Medicine

## 2010-01-22 ENCOUNTER — Emergency Department (HOSPITAL_COMMUNITY): Admission: EM | Admit: 2010-01-22 | Discharge: 2010-01-22 | Payer: Self-pay | Admitting: Emergency Medicine

## 2010-01-30 ENCOUNTER — Telehealth: Payer: Self-pay | Admitting: Internal Medicine

## 2010-01-31 ENCOUNTER — Ambulatory Visit: Payer: Self-pay | Admitting: Family Medicine

## 2010-01-31 ENCOUNTER — Ambulatory Visit: Payer: Self-pay | Admitting: Internal Medicine

## 2010-01-31 DIAGNOSIS — I10 Essential (primary) hypertension: Secondary | ICD-10-CM

## 2010-02-02 ENCOUNTER — Telehealth: Payer: Self-pay | Admitting: Internal Medicine

## 2010-02-06 ENCOUNTER — Telehealth (INDEPENDENT_AMBULATORY_CARE_PROVIDER_SITE_OTHER): Payer: Self-pay | Admitting: *Deleted

## 2010-02-07 ENCOUNTER — Ambulatory Visit: Payer: Self-pay | Admitting: Internal Medicine

## 2010-02-07 ENCOUNTER — Encounter (INDEPENDENT_AMBULATORY_CARE_PROVIDER_SITE_OTHER): Payer: Self-pay

## 2010-02-07 ENCOUNTER — Ambulatory Visit: Payer: Self-pay

## 2010-02-07 ENCOUNTER — Encounter (HOSPITAL_COMMUNITY): Admission: RE | Admit: 2010-02-07 | Discharge: 2010-04-03 | Payer: Self-pay | Admitting: Internal Medicine

## 2010-02-14 ENCOUNTER — Ambulatory Visit: Payer: Self-pay | Admitting: Family Medicine

## 2010-02-14 LAB — CONVERTED CEMR LAB: INR: 2.9

## 2010-02-15 ENCOUNTER — Telehealth: Payer: Self-pay | Admitting: Internal Medicine

## 2010-02-15 ENCOUNTER — Encounter: Payer: Self-pay | Admitting: Family Medicine

## 2010-02-17 ENCOUNTER — Encounter: Payer: Self-pay | Admitting: Family Medicine

## 2010-02-19 ENCOUNTER — Encounter: Payer: Self-pay | Admitting: Internal Medicine

## 2010-03-02 ENCOUNTER — Encounter: Payer: Self-pay | Admitting: Family Medicine

## 2010-03-08 ENCOUNTER — Ambulatory Visit: Payer: Self-pay | Admitting: Family Medicine

## 2010-03-08 LAB — CONVERTED CEMR LAB: INR: 1.4

## 2010-03-20 ENCOUNTER — Ambulatory Visit: Payer: Self-pay | Admitting: Sports Medicine

## 2010-03-20 DIAGNOSIS — M25539 Pain in unspecified wrist: Secondary | ICD-10-CM

## 2010-03-21 ENCOUNTER — Encounter: Admission: RE | Admit: 2010-03-21 | Discharge: 2010-03-21 | Payer: Self-pay | Admitting: Sports Medicine

## 2010-03-22 ENCOUNTER — Encounter: Payer: Self-pay | Admitting: *Deleted

## 2010-03-22 ENCOUNTER — Ambulatory Visit: Payer: Self-pay | Admitting: Family Medicine

## 2010-03-22 LAB — CONVERTED CEMR LAB: INR: 2.4

## 2010-03-28 ENCOUNTER — Telehealth: Payer: Self-pay | Admitting: *Deleted

## 2010-03-30 ENCOUNTER — Ambulatory Visit: Payer: Self-pay | Admitting: Family Medicine

## 2010-04-04 ENCOUNTER — Encounter: Payer: Self-pay | Admitting: Family Medicine

## 2010-04-06 ENCOUNTER — Telehealth: Payer: Self-pay | Admitting: Family Medicine

## 2010-04-11 ENCOUNTER — Encounter: Payer: Self-pay | Admitting: Family Medicine

## 2010-04-11 ENCOUNTER — Ambulatory Visit: Payer: Self-pay | Admitting: Family Medicine

## 2010-04-11 LAB — CONVERTED CEMR LAB
AST: 19 units/L (ref 0–37)
Albumin: 4.5 g/dL (ref 3.5–5.2)
Alkaline Phosphatase: 83 units/L (ref 39–117)
Calcium: 9.7 mg/dL (ref 8.4–10.5)
Chloride: 99 meq/L (ref 96–112)
Glucose, Bld: 89 mg/dL (ref 70–99)
Hemoglobin: 12 g/dL (ref 12.0–15.0)
MCHC: 31.8 g/dL (ref 30.0–36.0)
Potassium: 4.1 meq/L (ref 3.5–5.3)
RBC: 4.46 M/uL (ref 3.87–5.11)
RDW: 15 % (ref 11.5–15.5)
Sodium: 139 meq/L (ref 135–145)
Total Protein: 7 g/dL (ref 6.0–8.3)

## 2010-04-12 ENCOUNTER — Encounter: Payer: Self-pay | Admitting: *Deleted

## 2010-04-13 ENCOUNTER — Telehealth: Payer: Self-pay | Admitting: Family Medicine

## 2010-04-20 ENCOUNTER — Telehealth: Payer: Self-pay | Admitting: Family Medicine

## 2010-04-20 ENCOUNTER — Emergency Department (HOSPITAL_COMMUNITY): Admission: EM | Admit: 2010-04-20 | Discharge: 2010-04-20 | Payer: Self-pay | Admitting: Emergency Medicine

## 2010-04-23 ENCOUNTER — Encounter: Payer: Self-pay | Admitting: Family Medicine

## 2010-04-24 ENCOUNTER — Telehealth: Payer: Self-pay | Admitting: Family Medicine

## 2010-04-25 ENCOUNTER — Ambulatory Visit: Payer: Self-pay | Admitting: Family Medicine

## 2010-04-26 ENCOUNTER — Ambulatory Visit: Payer: Self-pay | Admitting: Family Medicine

## 2010-04-26 ENCOUNTER — Telehealth: Payer: Self-pay | Admitting: Family Medicine

## 2010-04-26 ENCOUNTER — Emergency Department (HOSPITAL_COMMUNITY): Admission: EM | Admit: 2010-04-26 | Discharge: 2010-04-26 | Payer: Self-pay | Admitting: Emergency Medicine

## 2010-04-27 ENCOUNTER — Ambulatory Visit: Payer: Self-pay | Admitting: Family Medicine

## 2010-05-01 ENCOUNTER — Ambulatory Visit: Payer: Self-pay | Admitting: Family Medicine

## 2010-05-01 ENCOUNTER — Ambulatory Visit (HOSPITAL_COMMUNITY): Admission: RE | Admit: 2010-05-01 | Discharge: 2010-05-01 | Payer: Self-pay | Admitting: Family Medicine

## 2010-05-02 ENCOUNTER — Encounter: Payer: Self-pay | Admitting: Family Medicine

## 2010-05-03 ENCOUNTER — Ambulatory Visit (HOSPITAL_COMMUNITY): Admission: RE | Admit: 2010-05-03 | Discharge: 2010-05-03 | Payer: Self-pay | Admitting: Pediatrics

## 2010-05-07 ENCOUNTER — Ambulatory Visit (HOSPITAL_COMMUNITY): Admission: RE | Admit: 2010-05-07 | Discharge: 2010-05-07 | Payer: Self-pay | Admitting: Pediatrics

## 2010-05-11 ENCOUNTER — Telehealth: Payer: Self-pay | Admitting: Family Medicine

## 2010-05-14 ENCOUNTER — Ambulatory Visit: Payer: Self-pay | Admitting: Family Medicine

## 2010-05-14 ENCOUNTER — Telehealth: Payer: Self-pay | Admitting: Family Medicine

## 2010-05-14 ENCOUNTER — Ambulatory Visit: Admission: RE | Admit: 2010-05-14 | Discharge: 2010-05-14 | Payer: Self-pay | Admitting: Family Medicine

## 2010-05-14 ENCOUNTER — Ambulatory Visit: Payer: Self-pay | Admitting: Surgery

## 2010-05-14 ENCOUNTER — Encounter: Payer: Self-pay | Admitting: Family Medicine

## 2010-05-16 ENCOUNTER — Ambulatory Visit (HOSPITAL_COMMUNITY): Admission: RE | Admit: 2010-05-16 | Discharge: 2010-05-16 | Payer: Self-pay | Admitting: Pediatrics

## 2010-05-16 ENCOUNTER — Encounter: Payer: Self-pay | Admitting: Family Medicine

## 2010-05-17 ENCOUNTER — Ambulatory Visit (HOSPITAL_COMMUNITY): Admission: RE | Admit: 2010-05-17 | Discharge: 2010-05-17 | Payer: Self-pay | Admitting: Family Medicine

## 2010-05-21 ENCOUNTER — Inpatient Hospital Stay (HOSPITAL_COMMUNITY): Admission: RE | Admit: 2010-05-21 | Discharge: 2010-05-22 | Payer: Self-pay | Admitting: Neurosurgery

## 2010-05-22 ENCOUNTER — Telehealth: Payer: Self-pay | Admitting: Family Medicine

## 2010-05-25 ENCOUNTER — Telehealth: Payer: Self-pay | Admitting: *Deleted

## 2010-06-01 HISTORY — PX: WRIST SURGERY: SHX841

## 2010-06-07 ENCOUNTER — Telehealth: Payer: Self-pay | Admitting: Family Medicine

## 2010-06-07 ENCOUNTER — Ambulatory Visit: Payer: Self-pay | Admitting: Family Medicine

## 2010-06-07 LAB — CONVERTED CEMR LAB
Nitrite: NEGATIVE
Specific Gravity, Urine: 1.015
Urobilinogen, UA: 0.2
WBC Urine, dipstick: NEGATIVE

## 2010-06-18 ENCOUNTER — Encounter: Payer: Self-pay | Admitting: Family Medicine

## 2010-06-20 ENCOUNTER — Encounter: Payer: Self-pay | Admitting: Family Medicine

## 2010-06-21 ENCOUNTER — Ambulatory Visit: Payer: Self-pay | Admitting: Family Medicine

## 2010-07-11 ENCOUNTER — Telehealth: Payer: Self-pay | Admitting: Family Medicine

## 2010-07-19 ENCOUNTER — Telehealth: Payer: Self-pay | Admitting: Family Medicine

## 2010-07-25 ENCOUNTER — Ambulatory Visit: Payer: Self-pay | Admitting: Family Medicine

## 2010-07-25 ENCOUNTER — Encounter: Payer: Self-pay | Admitting: Family Medicine

## 2010-07-26 ENCOUNTER — Encounter: Payer: Self-pay | Admitting: Family Medicine

## 2010-08-08 ENCOUNTER — Emergency Department (HOSPITAL_COMMUNITY): Admission: EM | Admit: 2010-08-08 | Discharge: 2010-08-08 | Payer: Self-pay | Admitting: Family Medicine

## 2010-08-08 ENCOUNTER — Encounter: Payer: Self-pay | Admitting: Family Medicine

## 2010-08-09 ENCOUNTER — Ambulatory Visit: Payer: Self-pay | Admitting: Family Medicine

## 2010-08-09 ENCOUNTER — Telehealth: Payer: Self-pay | Admitting: Family Medicine

## 2010-08-13 ENCOUNTER — Emergency Department (HOSPITAL_COMMUNITY): Admission: EM | Admit: 2010-08-13 | Discharge: 2010-08-13 | Payer: Self-pay | Admitting: Emergency Medicine

## 2010-08-24 ENCOUNTER — Inpatient Hospital Stay (HOSPITAL_COMMUNITY): Admission: AD | Admit: 2010-08-24 | Discharge: 2010-08-27 | Payer: Self-pay | Admitting: Pediatrics

## 2010-09-06 ENCOUNTER — Telehealth: Payer: Self-pay | Admitting: Family Medicine

## 2010-09-07 ENCOUNTER — Ambulatory Visit: Payer: Self-pay | Admitting: Family Medicine

## 2010-09-10 ENCOUNTER — Telehealth: Payer: Self-pay | Admitting: Family Medicine

## 2010-09-10 ENCOUNTER — Emergency Department (HOSPITAL_COMMUNITY): Admission: EM | Admit: 2010-09-10 | Discharge: 2010-09-10 | Payer: Self-pay | Admitting: Family Medicine

## 2010-09-17 ENCOUNTER — Ambulatory Visit (HOSPITAL_COMMUNITY): Admission: RE | Admit: 2010-09-17 | Discharge: 2010-09-17 | Payer: Self-pay | Admitting: Family Medicine

## 2010-09-17 ENCOUNTER — Encounter: Payer: Self-pay | Admitting: Family Medicine

## 2010-09-17 ENCOUNTER — Ambulatory Visit: Payer: Self-pay | Admitting: Family Medicine

## 2010-09-17 LAB — CONVERTED CEMR LAB
ALT: 9 units/L (ref 0–35)
BUN: 11 mg/dL (ref 6–23)
Blood in Urine, dipstick: NEGATIVE
CO2: 25 meq/L (ref 19–32)
Calcium: 9.1 mg/dL (ref 8.4–10.5)
Chloride: 104 meq/L (ref 96–112)
Creatinine, Ser: 0.76 mg/dL (ref 0.40–1.20)
Eosinophils Relative: 1 % (ref 0–5)
Glucose, Bld: 95 mg/dL (ref 70–99)
HCT: 40.3 % (ref 36.0–46.0)
Lymphocytes Relative: 29 % (ref 12–46)
Lymphs Abs: 2.2 10*3/uL (ref 0.7–4.0)
Monocytes Relative: 9 % (ref 3–12)
Nitrite: NEGATIVE
Platelets: 276 10*3/uL (ref 150–400)
Protein, U semiquant: NEGATIVE
RBC: 4.89 M/uL (ref 3.87–5.11)
Urobilinogen, UA: 0.2
WBC Urine, dipstick: NEGATIVE
WBC: 7.9 10*3/uL (ref 4.0–10.5)
pH: 6

## 2010-09-18 ENCOUNTER — Encounter: Payer: Self-pay | Admitting: Family Medicine

## 2010-09-18 ENCOUNTER — Telehealth: Payer: Self-pay | Admitting: Family Medicine

## 2010-09-21 ENCOUNTER — Ambulatory Visit: Payer: Self-pay | Admitting: Family Medicine

## 2010-10-02 ENCOUNTER — Telehealth: Payer: Self-pay | Admitting: Family Medicine

## 2010-10-03 ENCOUNTER — Ambulatory Visit: Payer: Self-pay | Admitting: Family Medicine

## 2010-10-03 LAB — CONVERTED CEMR LAB
Beta hcg, urine, semiquantitative: NEGATIVE
Glucose, Urine, Semiquant: NEGATIVE
Ketones, urine, test strip: NEGATIVE
Nitrite: NEGATIVE
Specific Gravity, Urine: 1.015
pH: 6

## 2010-10-10 ENCOUNTER — Encounter: Payer: Self-pay | Admitting: Family Medicine

## 2010-10-18 ENCOUNTER — Telehealth: Payer: Self-pay | Admitting: Family Medicine

## 2010-10-19 ENCOUNTER — Emergency Department (HOSPITAL_COMMUNITY): Admission: EM | Admit: 2010-10-19 | Discharge: 2010-10-19 | Payer: Self-pay | Admitting: Family Medicine

## 2010-10-19 ENCOUNTER — Telehealth: Payer: Self-pay | Admitting: Family Medicine

## 2010-10-24 ENCOUNTER — Ambulatory Visit: Payer: Self-pay | Admitting: Family Medicine

## 2010-10-24 DIAGNOSIS — N949 Unspecified condition associated with female genital organs and menstrual cycle: Secondary | ICD-10-CM | POA: Insufficient documentation

## 2010-11-01 ENCOUNTER — Telehealth: Payer: Self-pay | Admitting: Family Medicine

## 2010-11-01 ENCOUNTER — Telehealth: Payer: Self-pay | Admitting: *Deleted

## 2010-11-02 ENCOUNTER — Ambulatory Visit (HOSPITAL_COMMUNITY)
Admission: RE | Admit: 2010-11-02 | Discharge: 2010-11-02 | Payer: Self-pay | Source: Home / Self Care | Admitting: Family Medicine

## 2010-11-02 ENCOUNTER — Telehealth (INDEPENDENT_AMBULATORY_CARE_PROVIDER_SITE_OTHER): Payer: Self-pay | Admitting: *Deleted

## 2010-11-03 ENCOUNTER — Emergency Department (HOSPITAL_COMMUNITY)
Admission: EM | Admit: 2010-11-03 | Discharge: 2010-11-03 | Payer: Self-pay | Source: Home / Self Care | Admitting: Emergency Medicine

## 2010-11-05 ENCOUNTER — Telehealth: Payer: Self-pay | Admitting: Family Medicine

## 2010-11-06 ENCOUNTER — Encounter: Payer: Self-pay | Admitting: *Deleted

## 2010-11-06 ENCOUNTER — Ambulatory Visit: Payer: Self-pay | Admitting: Family Medicine

## 2010-11-08 ENCOUNTER — Ambulatory Visit: Payer: Self-pay | Admitting: Family Medicine

## 2010-11-12 ENCOUNTER — Other Ambulatory Visit
Admission: RE | Admit: 2010-11-12 | Discharge: 2010-11-12 | Payer: Self-pay | Source: Home / Self Care | Admitting: Family Medicine

## 2010-11-12 ENCOUNTER — Ambulatory Visit: Payer: Self-pay | Admitting: Family Medicine

## 2010-11-15 ENCOUNTER — Encounter: Payer: Self-pay | Admitting: Family Medicine

## 2010-11-19 ENCOUNTER — Ambulatory Visit: Payer: Self-pay

## 2010-11-19 ENCOUNTER — Emergency Department (HOSPITAL_COMMUNITY)
Admission: EM | Admit: 2010-11-19 | Discharge: 2010-11-19 | Payer: Self-pay | Source: Home / Self Care | Admitting: Family Medicine

## 2010-11-27 ENCOUNTER — Telehealth: Payer: Self-pay | Admitting: Family Medicine

## 2010-12-07 ENCOUNTER — Emergency Department (HOSPITAL_COMMUNITY)
Admission: EM | Admit: 2010-12-07 | Discharge: 2010-12-07 | Payer: Self-pay | Source: Home / Self Care | Admitting: Family Medicine

## 2010-12-13 ENCOUNTER — Ambulatory Visit
Admission: RE | Admit: 2010-12-13 | Discharge: 2010-12-13 | Payer: Self-pay | Source: Home / Self Care | Attending: Obstetrics and Gynecology | Admitting: Obstetrics and Gynecology

## 2010-12-18 ENCOUNTER — Encounter: Payer: Self-pay | Admitting: Family Medicine

## 2010-12-18 ENCOUNTER — Ambulatory Visit
Admission: RE | Admit: 2010-12-18 | Discharge: 2010-12-18 | Payer: Self-pay | Source: Home / Self Care | Attending: Family Medicine | Admitting: Family Medicine

## 2010-12-18 DIAGNOSIS — J209 Acute bronchitis, unspecified: Secondary | ICD-10-CM | POA: Insufficient documentation

## 2010-12-19 ENCOUNTER — Encounter: Payer: Self-pay | Admitting: Family Medicine

## 2010-12-20 ENCOUNTER — Ambulatory Visit: Admit: 2010-12-20 | Payer: Self-pay

## 2010-12-20 ENCOUNTER — Telehealth: Payer: Self-pay | Admitting: *Deleted

## 2010-12-21 ENCOUNTER — Ambulatory Visit: Admission: RE | Admit: 2010-12-21 | Discharge: 2010-12-21 | Payer: Self-pay | Source: Home / Self Care

## 2010-12-21 ENCOUNTER — Ambulatory Visit (HOSPITAL_COMMUNITY)
Admission: RE | Admit: 2010-12-21 | Discharge: 2010-12-21 | Payer: Self-pay | Source: Home / Self Care | Admitting: Family Medicine

## 2010-12-21 ENCOUNTER — Encounter: Payer: Self-pay | Admitting: Family Medicine

## 2010-12-21 ENCOUNTER — Other Ambulatory Visit: Payer: Self-pay

## 2010-12-21 DIAGNOSIS — R079 Chest pain, unspecified: Secondary | ICD-10-CM | POA: Insufficient documentation

## 2010-12-26 ENCOUNTER — Telehealth: Payer: Self-pay | Admitting: Family Medicine

## 2010-12-30 ENCOUNTER — Emergency Department (HOSPITAL_COMMUNITY)
Admission: EM | Admit: 2010-12-30 | Discharge: 2010-12-30 | Payer: Self-pay | Source: Home / Self Care | Admitting: Family Medicine

## 2010-12-31 ENCOUNTER — Inpatient Hospital Stay (HOSPITAL_COMMUNITY)
Admission: EM | Admit: 2010-12-31 | Discharge: 2011-01-03 | DRG: 176 | Disposition: A | Discharge status: Home / Self Care | Payer: Medicaid Other | Source: Other Acute Inpatient Hospital | Attending: Family Medicine | Admitting: Family Medicine

## 2010-12-31 ENCOUNTER — Encounter: Payer: Self-pay | Admitting: Family Medicine

## 2010-12-31 ENCOUNTER — Emergency Department (HOSPITAL_COMMUNITY)
Admission: RE | Admit: 2010-12-31 | Discharge: 2010-12-31 | Disposition: A | Discharge status: Other Acute Inpatient Hospital | Payer: Self-pay | Source: Home / Self Care | Attending: Emergency Medicine | Admitting: Emergency Medicine

## 2010-12-31 ENCOUNTER — Telehealth: Payer: Self-pay | Admitting: Family Medicine

## 2010-12-31 ENCOUNTER — Encounter: Payer: Self-pay | Admitting: Sports Medicine

## 2010-12-31 ENCOUNTER — Ambulatory Visit
Admission: RE | Admit: 2010-12-31 | Discharge: 2010-12-31 | Payer: Self-pay | Source: Home / Self Care | Attending: Family Medicine | Admitting: Family Medicine

## 2010-12-31 DIAGNOSIS — Z7901 Long term (current) use of anticoagulants: Secondary | ICD-10-CM

## 2010-12-31 DIAGNOSIS — N319 Neuromuscular dysfunction of bladder, unspecified: Secondary | ICD-10-CM | POA: Diagnosis present

## 2010-12-31 DIAGNOSIS — R0602 Shortness of breath: Secondary | ICD-10-CM | POA: Insufficient documentation

## 2010-12-31 DIAGNOSIS — I2699 Other pulmonary embolism without acute cor pulmonale: Principal | ICD-10-CM | POA: Diagnosis present

## 2010-12-31 DIAGNOSIS — G809 Cerebral palsy, unspecified: Secondary | ICD-10-CM | POA: Diagnosis present

## 2010-12-31 DIAGNOSIS — I10 Essential (primary) hypertension: Secondary | ICD-10-CM | POA: Diagnosis present

## 2010-12-31 LAB — CBC
Hemoglobin: 13.1 g/dL (ref 12.0–15.0)
MCHC: 31.5 g/dL (ref 30.0–36.0)
MCHC: 34 g/dL (ref 30.0–36.0)
MCV: 85.9 fL (ref 78.0–100.0)
Platelets: 263 10*3/uL (ref 150–400)
RDW: 14.4 % (ref 11.5–15.5)
WBC: 10.3 10*3/uL (ref 4.0–10.5)
WBC: 10.6 10*3/uL — ABNORMAL HIGH (ref 4.0–10.5)

## 2010-12-31 LAB — DIFFERENTIAL
Basophils Absolute: 0 10*3/uL (ref 0.0–0.1)
Basophils Relative: 0 % (ref 0–1)
Monocytes Absolute: 1.2 10*3/uL — ABNORMAL HIGH (ref 0.1–1.0)
Neutro Abs: 5.5 10*3/uL (ref 1.7–7.7)
Neutrophils Relative %: 52 % (ref 43–77)

## 2010-12-31 LAB — CONVERTED CEMR LAB
Hemoglobin: 12.5 g/dL (ref 12.0–15.0)
MCHC: 31.5 g/dL (ref 30.0–36.0)
MCHC: 31.5 g/dL (ref 30.0–36.0)
MCV: 85.9 fL (ref 78.0–100.0)
MCV: 85.9 fL (ref 78.0–100.0)
Platelets: 263 10*3/uL (ref 150–400)
RBC: 4.62 M/uL (ref 3.87–5.11)
RBC: 4.62 M/uL (ref 3.87–5.11)

## 2010-12-31 LAB — PROTIME-INR
INR: 1.02 (ref 0.00–1.49)
Prothrombin Time: 13.6 seconds (ref 11.6–15.2)

## 2010-12-31 LAB — BASIC METABOLIC PANEL
BUN: 6 mg/dL (ref 6–23)
Chloride: 106 mEq/L (ref 96–112)
Creatinine, Ser: 0.78 mg/dL (ref 0.4–1.2)

## 2011-01-01 ENCOUNTER — Telehealth: Payer: Self-pay | Admitting: *Deleted

## 2011-01-01 DIAGNOSIS — I10 Essential (primary) hypertension: Secondary | ICD-10-CM

## 2011-01-01 DIAGNOSIS — G894 Chronic pain syndrome: Secondary | ICD-10-CM

## 2011-01-01 DIAGNOSIS — R0602 Shortness of breath: Secondary | ICD-10-CM

## 2011-01-01 DIAGNOSIS — G808 Other cerebral palsy: Secondary | ICD-10-CM

## 2011-01-01 DIAGNOSIS — I2699 Other pulmonary embolism without acute cor pulmonale: Secondary | ICD-10-CM

## 2011-01-01 LAB — BASIC METABOLIC PANEL
BUN: 5 mg/dL — ABNORMAL LOW (ref 6–23)
Calcium: 8.3 mg/dL — ABNORMAL LOW (ref 8.4–10.5)
GFR calc non Af Amer: >60 mL/min (ref >60)
Glucose, Bld: 122 mg/dL — ABNORMAL HIGH (ref 70–99)
Sodium: 137 mEq/L (ref 135–145)

## 2011-01-01 LAB — MRSA PCR SCREENING: MRSA by PCR: NEGATIVE

## 2011-01-01 LAB — CBC
HCT: 33.3 % — ABNORMAL LOW (ref 36.0–46.0)
MCHC: 31.8 g/dL (ref 30.0–36.0)
MCV: 86.7 fL (ref 78.0–100.0)
RDW: 14.7 % (ref 11.5–15.5)

## 2011-01-02 ENCOUNTER — Telehealth: Payer: Self-pay | Admitting: Family Medicine

## 2011-01-02 LAB — CBC
HCT: 35.3 % — ABNORMAL LOW (ref 36.0–46.0)
Hemoglobin: 11.2 g/dL — ABNORMAL LOW (ref 12.0–15.0)
MCH: 28.1 pg (ref 26.0–34.0)
MCHC: 31.7 g/dL (ref 30.0–36.0)
MCV: 88.5 fL (ref 78.0–100.0)

## 2011-01-03 LAB — CBC
Hemoglobin: 10.6 g/dL — ABNORMAL LOW (ref 12.0–15.0)
MCH: 27.9 pg (ref 26.0–34.0)
MCV: 86.1 fL (ref 78.0–100.0)
RBC: 3.8 MIL/uL — ABNORMAL LOW (ref 3.87–5.11)

## 2011-01-03 NOTE — Miscellaneous (Signed)
  Medications Added VICODIN HP 10-660 MG TABS (HYDROCODONE-ACETAMINOPHEN) generic please sig 1 tab by mouth q 4-6 hrs as needed pain       Clinical Lists Changes Recent hand surgery--medication mix up at the pharmacy--cannot get the percocette the ortho MD wrote her (evidently his number is invalid??? Kennon Rounds talked to [place] at [facility] [employer]) She canot get her to pick up written percocette rx--we will call in some vicodin and she has f/u tomorrow here. Medications: Added new medication of VICODIN HP 10-660 MG TABS (HYDROCODONE-ACETAMINOPHEN) generic please sig 1 tab by mouth q 4-6 hrs as needed pain - Signed Rx of VICODIN HP 10-660 MG TABS (HYDROCODONE-ACETAMINOPHEN) generic please sig 1 tab by mouth q 4-6 hrs as needed pain;  #30 x 1;  Signed;  Entered by: Denny Levy MD;  Authorized by: Denny Levy MD;  Method used: Telephoned to [facility] [address], [place], [place]  [id number], Ph: [phone], Fax: [phone]    Prescriptions: VICODIN HP 10-660 MG TABS (HYDROCODONE-ACETAMINOPHEN) generic please sig 1 tab by mouth q 4-6 hrs as needed pain  #30 x 1   Entered and Authorized by:   Denny Levy MD   Signed by:   Denny Levy MD on 06/20/2010   Method used:   Telephoned to ...       [facility] (retail)       [address]       [place], [place]  [id number]       Ph: [phone]       Fax: [phone]   RxID:   [id number]  called it in. the pharmacist will see that it is picked up by her dad or they will deliver it. called pt & told her meds will be there soon.Golden Circle RN  June 20, 2010 2:35 PM

## 2011-01-03 NOTE — Assessment & Plan Note (Signed)
Summary: f/u,df   Vital Signs:  Patient profile:   35 year old female Weight:      161.4 pounds BMI:     32.72 Temp:     98.1 degrees F Pulse rate:   83 / minute BP sitting:   127 / 91  (right arm)  Vitals Entered By: Starleen Blue RN 01/03/2010 CC: f/u Is Patient Diabetic? No   Primary Care Provider:  Harland Aguiniga MD  CC:  f/u.  History of Present Illness: cc: left wrist pain.    35 y/o F with spastic CP and chronic left wrist pain.  Was seen by pt/ot and is doing exercises, but pain from elbow to wrist.  She is using heating pads, which helps, but is expensive.  Had CTR procedure by Dr Teressa Senter 9/09 and has been released by him.  Pt has tried splint in past, which caused muscle spasm.    Allergies: No Known Drug Allergies  Physical Exam  General:  Well-developed,well-nourished,in no acute distress; alert,appropriate and cooperative throughout examination. vitals reviewed.     Wrist/Hand Exam  Skin:    Intact with no erythema; no scarring.    Inspection:    deformity: left wrist in permanent contracture position.  Pt able to use index, middle, ring finger to hold/pick up things.    Palpation:    tenderness L-hand: and tenderness L-wrist:.    Vascular:    Radial pulse intact  Wrist Exam:    Left:    Inspection/Palpation:  pain with extension of left wrist.  pt's left wrist in permanent flexion position.     Impression & Recommendations:  Problem # 1:  WRIST PAIN, LEFT (ICD-719.43) Assessment Unchanged Pt's left wrist pain has been present for years.  Left wrist in flexion position.  Pain may be 2/2 contracture.  She has CTR by Dr Teressa Senter in 08/2008.  There has not been any improvement in pain after procedure.  We tried to refer her to see Dr Teressa Senter in Nov 29, but she dnka that appt.  Will refer to [facility].  Our hope is that over time she can straighten her wrist.  I have asked her to bring her splint to [facility]. In the meantime will continue ibuprofen. Orders: Sports Medicine  (Sports Med) [facility]- Est Level  3 [phone])  Complete Medication List: 1)  Baclofen 10 Mg Tabs (Baclofen) .... Take 1 tablet by mouth four times a day 2)  Toprol Xl 100 Mg Tb24 (Metoprolol succinate) .... Take 1 tablet by mouth once a day 3)  Seroquel 200 Mg Tabs (quetiapine Fumarate)  .... One tab by mouth qhs 4)  Effexor Xr 150 Mg Cp24 (Venlafaxine hcl) .... One tab by mouth qday per psych 5)  Depo-provera 150 Mg/ml Im Susp (Medroxyprogesterone acetate) .... Q30m 6)  Hydrocodone-acetaminophen 5-325 Mg Tabs (Hydrocodone-acetaminophen) .... One tab by mouth q 4 hrs as needed pan 7)  Albuterol Sulfate (2.5 Mg/29ml) 0.083% Nebu (Albuterol sulfate) .... Breathing treatment qid as needed , use for a least one month, qs for 1 months 8)  Tylenol With Codeine #3 300-30 Mg Tabs (Acetaminophen-codeine) .... Oen qid as needed cough suprression 9)  Lidocaine Viscous 2 % Soln (Lidocaine hcl) .... Paint small amount on tongue with cotton swab up to qid as needed for pain.  give 1 small bottle 10)  Lamictal 150 Mg Tabs (Lamotrigine) .... Per dr Sharene Skeans 11)  Klonopin 1 Mg Tabs (Clonazepam) .... Per dr Sharene Skeans 12)  Topiramate 25 Mg Tabs (Topiramate) .... Per  dr Sharene Skeans 13)  Zanaflex 2 Mg Caps (Tizanidine hcl) .... Per dr Sharene Skeans 14)  Coumadin 5 Mg Tabs (Warfarin sodium) .... Take as directed 15)  Oxybutynin Chloride 5 Mg Tabs (Oxybutynin chloride) .... One tab by mouth three times a day for overactive bladder 16)  Ventolin Hfa 108 (90 Base) Mcg/act Aers (Albuterol sulfate) .... 2 puff every 4 hours as needed for shortness of breath 17)  Aerochamber Mv Misc (Spacer/aero-holding chambers) .... To use with mdi 18)  Tessalon Perles 100 Mg Caps (Benzonatate) .... Take 1-2 tablets by mouth every 8 hours as needed for cough. 19)  Ibuprofen 600 Mg Tabs (Ibuprofen) .[name] Kitchen.. 1 tab by mouth two times a day as needed pain Prescriptions: IBUPROFEN 600 MG TABS (IBUPROFEN) 1 tab by mouth two times a day as needed pain  #60 x 3    Entered and Authorized by:   Angeline Slim MD   Signed by:   Angeline Slim MD on 01/03/2010   Method used:   Electronically to        [facility] (retail)       [address]       [place], [place]  [id number]       Ph: [phone]       Fax: [phone]   RxID:   [id number]

## 2011-01-03 NOTE — Assessment & Plan Note (Signed)
Summary: 1 YR F/U STILL HAVING DIZZINESS/BLACKOUTS  Medications Added FLUTICASONE PROPIONATE 0.05 % CREA (FLUTICASONE PROPIONATE) as needed RESTORIL 22.5 MG CAPS (TEMAZEPAM) at bedtime MIRALAX  POWD (POLYETHYLENE GLYCOL 3350) as needed IMITREX 50 MG TABS (SUMATRIPTAN SUCCINATE) as needed AMLODIPINE BESYLATE 5 MG TABS (AMLODIPINE BESYLATE) Take one tablet by mouth daily      Allergies Added: NKDA  Visit Type:  Follow-up Referring Provider:  [place] Primary Provider:  CAT TA MD  CC:  blacking out / HTN.  History of Present Illness: Mr. [name] [name] is a 35 year old woman with a history of spastic cerebral palsy as well as sinus tachycardia and blackout spells. She has had video monitoring for seizures and this was negative. She has also had an extensive workup with Holter monitor and echocardiogram and these have all been negative. She is on Toprol for somewhat of an inappropriate sinus tachycardia, but this has been much better.  H/o PE in 10/10 now on coumadin/  Referred back from neurology for poorly controlled HTN and persistent blackout spells. Says 1-2x a day develps blank stares for about 5-10 secs and then gets better. BP checked right after witnessed spell and it was high. No documented low BPs. Says her heart races and also has CP about every day. Dr. Sharene Skeans added another BP med 2-3 months ago but BP still high.   Brings in BP cuff and BPs 140-150/100-110 range. Wants to know if she can be admitted for the work-up.   Current Medications (verified): 1)  Baclofen 10 Mg Tabs (Baclofen) .... Take 1 Tablet By Mouth Four Times A Day 2)  Toprol Xl 100 Mg Tb24 (Metoprolol Succinate) .... Take 1 Tablet By Mouth Once A Day 3)  Seroquel 200 Mg  Tabs (Quetiapine Fumarate) .... One Tab By Mouth Qhs 4)  Effexor Xr 150 Mg Cp24 (Venlafaxine Hcl) .... One Tab By Mouth Qday Per Psych 5)  Depo-Provera 150 Mg/ml Im Susp (Medroxyprogesterone Acetate) .... Q73m 6)  Hydrocodone-Acetaminophen 5-325 Mg  Tabs (Hydrocodone-Acetaminophen) .... One Tab By Mouth Q 4 Hrs As Needed Pan 7)  Lamictal 150 Mg Tabs (Lamotrigine) .... Per Dr Sharene Skeans 8)  Klonopin 1 Mg Tabs (Clonazepam) .... Per Dr Sharene Skeans 9)  Zanaflex 2 Mg Caps (Tizanidine Hcl) .... Per Dr Sharene Skeans 10)  Coumadin 5 Mg Tabs (Warfarin Sodium) .... Take As Directed 11)  Oxybutynin Chloride 5 Mg Tabs (Oxybutynin Chloride) .... One Tab By Mouth Three Times A Day For Overactive Bladder 12)  Aerochamber Mv  Misc (Spacer/aero-Holding Chambers) .... To Use With Mdi 13)  Ibuprofen 600 Mg Tabs (Ibuprofen) .[name] Kitchen.. 1 Tab By Mouth Two Times A Day As Needed Pain 14)  Fluticasone Propionate 0.05 % Crea (Fluticasone Propionate) .... As Needed 15)  Restoril 22.5 Mg Caps (Temazepam) .... At Bedtime 16)  Miralax  Powd (Polyethylene Glycol 3350) .... As Needed 17)  Imitrex 50 Mg Tabs (Sumatriptan Succinate) .... As Needed 18)  Amlodipine Besylate 5 Mg Tabs (Amlodipine Besylate) .... Take One Tablet By Mouth Daily  Allergies (verified): No Known Drug Allergies  Review of Systems       As per HPI and past medical history; otherwise all systems negative.   Vital Signs:  Patient profile:   35 year old female Height:      59 inches Weight:      160 pounds BMI:     32.43 Pulse rate:   75 / minute BP sitting:   142 / 100  (left arm) Cuff size:   regular  Vitals  Entered By: Hardin Negus, RMA (January 31, 2010 12:30 PM)  Physical Exam  General:  Young women with cerebal palsy sitting in W-C. Dysarthric.  HEENT: normal Neck: no JVD. Carotids 2+ bilat; no bruits. Cor: PMI nondisplaced. Regular rate & rhythm. No rubs, gallops, murmur. Lungs: clear Abdomen: soft, nontender, nondistended. Good bowel sounds. Extremities: no edema. limbs weak. Neuro: alert & orientedx3, cranial nerves grossly intact. affect appropriate    New Orders:     1)  EKG w/ Interpretation (93[address])     2)  Nuclear Stress Test (Nuc Stress Test)     3)  Holter  (Holter)   Impression & Recommendations:  Problem # 1:  CHEST PAIN-UNSPECIFIED (ICD-786.50) I do not think this is cardiac in nature but  given its persistent nature will proceed with Lexiscan Myoview to evalaute. Could have recurrent PE but unlikely on coumadin. Can consider VQ in future for chronic PE.   Problem # 2:  SYNCOPE AND COLLAPSE (ICD-780.2) Once again, doubt these are cardiac given quality of symptoms and previous normal work-up. To be complete, wil re-evalaute with 48-hour holter. I suspect there may be some secondary gain involved.   Problem # 3:  HYPERTENSION, BENIGN (ICD-401.1) Add amlodipine 5. F/u with PCP.   Other Orders: EKG w/ Interpretation (93000) Nuclear Stress Test (Nuc Stress Test) Holter (Holter)  Patient Instructions: 1)  Start Amlodipine 5mg  daily 2)  Your physician has recommended that you wear a holter monitor.  Holter monitors are medical devices that record the heart's electrical activity. Doctors most often use these monitors to diagnose arrhythmias. Arrhythmias are problems with the speed or rhythm of the heartbeat. The monitor is a small, portable device. You can wear one while you do your normal daily activities. This is usually used to diagnose what is causing palpitations/syncope (passing out). 3)  Your physician has requested that you have a Lexiscan myoview.  For further information please visit [email or web address]  Please follow instruction sheet, as given. Prescriptions: AMLODIPINE BESYLATE 5 MG TABS (AMLODIPINE BESYLATE) Take one tablet by mouth daily  #30 x 6   Entered by:   Meredith Staggers, RN   Authorized by:   Dolores Patty, MD, [facility]   Signed by:   Meredith Staggers, RN on 01/31/2010   Method used:   Electronically to        [facility] (retail)       [address]       [place], [place]  [address]       Ph: [phone]       Fax: [phone]   RxID:   [id number]

## 2011-01-03 NOTE — Progress Notes (Signed)
Summary: triage   Phone Note Call from Patient Call back at Home Phone [phone]   Caller: Patient Summary of Call: cut callous off her foot and thinks she needs an antibotic. Initial call taken by: Clydell Hakim,  December 06, 2009 9:26 AM  Follow-up for Phone Call        she will be here at 11:15 to see pcp. I discussed the problems cutting calluses off can bring. has done this before. Follow-up by: Golden Circle RN,  December 06, 2009 9:49 AM

## 2011-01-03 NOTE — Consult Note (Signed)
[facility]: [facility]   Imported By: Clydell Hakim 03/22/2010 12:23:32  _____________________________________________________________________  External Attachment:    Type:   Image     Comment:   External Document  Appended Document: [facility]    Past History:  Past Medical History: 1. ?migraines?--tx`d successfully w/ Imitrex (3/06) and prophylaxic by neuro 2. 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 DJD 4. Dysarthria with mild dysphagia 5. recurren UTI's  6. Endometriosis 7. GERD 8. H/o gestational DM w/ G2 9. H/o neurogenic bladder 10. 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 Right shin MRSA abcess requiring I&D (3/06) 12. PE-09/2009-anticoagulated 13. sinus tachycardia  Past Surgical History: -Baclofen pump insertion  8/00 -Colposcopy--7/01--LGSIL - 07/08/2000 -C-section x 2:  1997 & 2003 -BTL 2003 -Gallbladder U/S--normal  1/98 -,  -I&D of right LE MRSA abscess - 02/27/2005,  -Laproscopic excision of endometriosis and laproscopic uterosacral nerve ablation (LUNA) procedure 10/2001 -Left Carpal Tunnel Ligament Release 08/2008 by Dr Teressa Senter -Oral surgery for 10 teeth extraction 07/18/09 -EEG 01/2010 by Dr Sharene Skeans: normal, no seizure activities, 6 events that occurred were preceded by hypoventitation

## 2011-01-03 NOTE — Miscellaneous (Signed)
Summary: BP check   Clinical Lists Changes   Patient in office for lab and ask to have BP checked because it was elevated at [facility] on 03/20/2010. BP checked manually using regular cuff. BP left arm 170/100. pulse 68. she states she had not taken the metoprolol for 3 days . yesterday she did take it at 11:30 AM because she got scared after she was told BP was elevated at [facility] on 03/20/2010. her cardiologist had ordered amlopidine on 01/31/2010 .  RN called [facility] and they state it was delivered to her home on 02/27/2010 and delivered to a [employer]. this is patient's daughter  and patient does not  know anything about  receiving this medication.  Consulted with Dr. Donalee Citrin and she advises for patient to get back  on both meds for BP and recheck BP again in one week. advised patient to go home and check in her med box and make sure she doesn't have Amlopidine ( Norvasc) If not to call [facility] and ask them to send again to her . she states her dad will pay for it since her medicaid may not allow payment within 30 days.  Theresia Lo RN  March 22, 2010 12:19 PM

## 2011-01-03 NOTE — Miscellaneous (Signed)
Summary: c/o severe HA x 3 days   Clinical Lists Changes states ibu does not help. denies vision issues with the HA. vomited saturday. no nausea or vomiting since. has been taking the hydrocodone.  unable to come in now as we are full. in a lot of pain. suggested UC after dad gets off work & can bring her to UC. she agreed.Golden Circle RN  August 08, 2010 3:39 PM

## 2011-01-03 NOTE — Letter (Signed)
Summary: [facility]   [facility]   Imported By: Roderic Ovens 04/04/2010 15:25:43  _____________________________________________________________________  External Attachment:    Type:   Image     Comment:   External Document

## 2011-01-03 NOTE — Consult Note (Signed)
Summary: [facility]: Adm  Staring Spells, D/C Dx: Hyperventilation  [facility]   Imported By: Clydell Hakim 02/20/2010 15:30:44  _____________________________________________________________________  External Attachment:    Type:   Image     Comment:   External Document

## 2011-01-03 NOTE — Assessment & Plan Note (Signed)
Summary: cut callus/[place]   Vital Signs:  Patient profile:   35 year old female Weight:      164.0 pounds Temp:     98.1 degrees F BP sitting:   124 / 88  (right arm)  Vitals Entered By: Starleen Blue RN 12/06/2009 CC: callous on foot Is Patient Diabetic? No Pain Assessment Patient in pain? no        Primary Care Provider:  Olaoluwa Grieder MD  CC:  callous on foot.  History of Present Illness: cc: pt cut callous on foot  35 y/o F with CP presented after cutting callous on left lateral maleolus.  Pt states that the callous was bothering her so she cut it with scissors.  It bled for a little bit but has since stopped bleeding.  She denies any pain.  States that she has done this in the past.   Habits & Providers  Alcohol-Tobacco-Diet     Tobacco Status: never  Allergies: No Known Drug Allergies  Physical Exam  General:  Well-developed,well-nourished,in no acute distress; alert,appropriate and cooperative throughout examination. vitals reviewed.   Foot/Ankle Exam  Gait:    pt is wheelchair bound from cp  Foot Exam:    Left:    Inspection/Palpation:  Callous the size of 2 cm in diameter.  there is a linear (2cm) opening of dermis showing from where callous was removed.  No bleeding.  No redness.  No warmth.  No pustule.  No swelling.  No tenderness.  Sensation intact. Limited mobility is intact.   Impression & Recommendations:  Problem # 1:  CALLUS, LEFT FOOT (ICD-700) Assessment New No bleeding.  No signs of infection.  Will place bacitracin ointment on it and wrap with clean gauze.  Pt states that she has neosporin at home and I told her that she can use this.  Advised to keep it clean with bandaging to prevent infection.  Advised pt to return if infection, swelling, bleeding, pustule.  If infection may need antibiotic. Orders: [facility]- Est Level  3 ([address])  Complete Medication List: 1)  Baclofen 10 Mg Tabs (Baclofen) .... Take 1 tablet by mouth four times a day 2)  Toprol  Xl 100 Mg Tb24 (Metoprolol succinate) .... Take 1 tablet by mouth once a day 3)  Seroquel 200 Mg Tabs (quetiapine Fumarate)  .... One tab by mouth qhs 4)  Effexor Xr 150 Mg Cp24 (Venlafaxine hcl) .... One tab by mouth qday per psych 5)  Depo-provera 150 Mg/ml Im Susp (Medroxyprogesterone acetate) .... Q78m 6)  Darvocet A500 100-500 Mg Tabs (Propoxyphene n-apap) .[name] Kitchen.. 1 tablet by mouth once daily 7)  Albuterol Sulfate (2.5 Mg/70ml) 0.083% Nebu (Albuterol sulfate) .... Breathing treatment qid as needed , use for a least one month, qs for 1 months 8)  Tylenol With Codeine #3 300-30 Mg Tabs (Acetaminophen-codeine) .... Oen qid as needed cough suprression 9)  Lidocaine Viscous 2 % Soln (Lidocaine hcl) .... Paint small amount on tongue with cotton swab up to qid as needed for pain.  give 1 small bottle 10)  Lamictal 150 Mg Tabs (Lamotrigine) .... Per dr Sharene Skeans 11)  Klonopin 1 Mg Tabs (Clonazepam) .... Per dr Sharene Skeans 12)  Topiramate 25 Mg Tabs (Topiramate) .... Per dr Sharene Skeans 13)  Zanaflex 2 Mg Caps (Tizanidine hcl) .... Per dr Sharene Skeans 14)  Coumadin 5 Mg Tabs (Warfarin sodium) .... Take as directed 15)  Oxybutynin Chloride 5 Mg Tabs (Oxybutynin chloride) .... One tab by mouth three times a day for overactive  bladder 16)  Darvocet A500 100-500 Mg Tabs (Propoxyphene n-apap) .... One tab qid as needed 17)  Ventolin Hfa 108 (90 Base) Mcg/act Aers (Albuterol sulfate) .... 2 puff every 4 hours as needed for shortness of breath 18)  Aerochamber Mv Misc (Spacer/aero-holding chambers) .... To use with mdi 19)  Tessalon Perles 100 Mg Caps (Benzonatate) .... Take 1-2 tablets by mouth every 8 hours as needed for cough.  Other Orders: INR/[facility] (04540)   ANTICOAGULATION RECORD PREVIOUS REGIMEN & LAB RESULTS Anticoagulation Diagnosis:  PE  415.19 on  08/14/2009 Previous INR Goal Range:  2 - 3 on  08/14/2009 Previous INR:  2.7 on  11/22/2009 Previous Coumadin Dose(mg):  5 mg tablets on  08/14/2009 Previous  Regimen:  10 mg - Wed;  7.5 mg - other days on  11/22/2009  NEW REGIMEN & LAB RESULTS Current INR: 1.8 Regimen: continue 10mg  Weds; 7.5mg  other days  Provider: Dr.Nini Cavan Repeat testing in: 2 weeks Other Comments: ...........test performed by...........[name] KitchenTerese Door, CMA   Dose has been reviewed with patient or caretaker during this visit. Reviewed by: Dr. Janalyn Harder

## 2011-01-03 NOTE — Letter (Signed)
Summary: Lab result  [facility]  [address]   [place], [place] [address]   Phone: [phone]  Fax: [phone]    07/26/2010  MANAR SMALLING [address] [place], [place]  [address]  Dear Ms. Steinhaus,  I tried calling you at home to discuss your lab results, but the line was continously busy.  Your tests in our clinic were normal.  TSH (thyroid): Normal A1C (diabetes): Normal Glucose: Normal    Sincerely,   Cat Ta MD  Appended Document: Lab result mailed

## 2011-01-03 NOTE — Progress Notes (Signed)
Summary: Triage   Phone Note Call from Patient Call back at Home Phone [phone]   Reason for Call: Talk to Nurse Summary of Call: pt wants to speak with Kennon Rounds about something on her neck Initial call taken by: Knox Royalty,  May 14, 2010 4:12 PM  Follow-up for Phone Call        LM Follow-up by: Golden Circle RN,  May 14, 2010 4:30 PM  Additional Follow-up for Phone Call Additional follow up Details #1::        she has no problems with her neck.  states they founf no blood clots in her legs. pt wants to know what to do next. told her I will send this to her md & then will call her with response Additional Follow-up by: Golden Circle RN,  May 15, 2010 9:15 AM    Additional Follow-up for Phone Call Additional follow up Details #2::    Tried calling pt yesterday.  LM on voicemail.  Tried calling today, line busy.   DVT ruled out, good news.  If still having foot pain, then can continue with her usual pain med.  I will order for her to get xray of R foot.   Follow-up by: Angeline Slim MD,  May 15, 2010 10:25 AM  Additional Follow-up for Phone Call Additional follow up Details #3:: Details for Additional Follow-up Action Taken: told her above. she will go to [facility] tomorrow to get the xray done. faxed it to radiology Additional Follow-up by: Golden Circle RN,  May 15, 2010 10:38 AM

## 2011-01-03 NOTE — Miscellaneous (Signed)
Summary: call from patient   Clinical Lists Changes patient calls to report she is taking the cough med  and she is not coughing but  her chest is hurting and feels tight. she  feels she has something in her chest and is unable to get it up. she stated that the cough medication was  "just  a little bit ". she is taking antiobitic as directed. she did a breathing treatment this morning and it did help some. will forward to MD to ask for further instructions. Theresia Lo RN  December 19, 2010 1:54 PM    She can come in to be rechecked again this afternoon. If we have no appt and she feels SOB with the chest pain she should go to the ER.  Milinda Antis MD  December 19, 2010 2:25 PM   patient notified of message  from MD . advised patient that she  should go to ER if she feels shortness of breath with the chest pain.  Theresia Lo RN  December 19, 2010 4:04 PM

## 2011-01-03 NOTE — Progress Notes (Signed)
Summary: pls call   Phone Note Call from Patient Call back at Home Phone [phone]   Caller: Patient Summary of Call: needs to talk to nurse Initial call taken by: De Nurse,  October 18, 2010 11:07 AM  Follow-up for Phone Call        spoke with patient and she states she had not had a period for 6 months then 2 weeks ago she started  with vaginal bleeding. she stopped last tuesday she states 11/08. she started bleeding again today .  she has just noticed this when she went to bathroom  there was blood in her panties. states bleeding is not heavy at this time. she is starting to have cramping and has taken ibuprofen but has not helped . Dr. Denyse Amass has given her hydrocodone for the cramping . [name] Kitchen appointment scheduled with Dr. Janalyn Harder for 10/24/2010 to discuss vaginal bleeding. advised her to call back if cramping not relieved with hydrocodone or if bleeding becomes very heavy . she is agreeable to this plan. Follow-up by: Theresia Lo RN,  October 18, 2010 11:56 AM

## 2011-01-03 NOTE — Progress Notes (Signed)
   Phone Note Call from Patient   Caller: Brecklynn Jian Call For: [phone] Summary of Call: Ms. Geiselman has an oxygen compressor and 3 tanks that she is not in need of.  Want to return but need an rx to do so.   Call her father and he will pick up and return himself. Initial call taken by: Abundio Miu,  November 27, 2010 3:57 PM  Follow-up for Phone Call        Verbal order to pick up and return unused/un-needed oxygen compressors and tanks.   Follow-up by: Angeline Slim MD,  November 27, 2010 4:36 PM  Additional Follow-up for Phone Call Additional follow up Details #1::        Left message that verbal order was given for return of equipment Additional Follow-up by: Abundio Miu,  November 28, 2010 9:02 AM

## 2011-01-03 NOTE — Letter (Signed)
Summary: Generic Letter  [facility]  [address]   [place], [place] [address]   Phone: [phone]  Fax: [phone]    04/12/2010  [name] [name] [address] [place], [place]  [address]  Dear Ms. Dolman,        I have scheduled you an appointment with Dr. Gerlene Burdock with [facility] [place] for May 02, 2010 at 9:00 AM. The address is [address] [place] . It is in the [employer] building on the first floor. Their phone number  is [phone]. This office is not directly on the campus of [place] but is at the address listed. If you need directions please call their office.  As we discussed on the phone you will need to take the xrays with you.        Sincerely,   Theresia Lo RN
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
## 2011-01-03 NOTE — Progress Notes (Signed)
   Phone Note Call from Patient   Caller: Patient Call For: [phone] Summary of Call: Pt need to talk with you regarding about nausea.  Was given meds but still having symptoms.   Initial call taken by: Abundio Miu,  September 06, 2010 4:08 PM  Follow-up for Phone Call        states she was in the hosptal 2 wks ago for 4 days. states she still has nausea. HA is not as bad as it was. has medicine for the nausea but it is not working. offered UC today. she refused. appt tomorrow at 1:30 work in. aware of wait & that she will not see her pcp.  told her if she feels worse, call 911 & go to ED. she agreed Follow-up by: Golden Circle RN,  September 06, 2010 4:13 PM

## 2011-01-03 NOTE — Progress Notes (Signed)
Summary: vaginal bleeding/ts   pt called back. does understand, NOT to take her BCP. she complains of  increased/worse vaginal bleeding and does not know what to do. will ask Dr.Ta what to do. use warm compress on abdomen. take pain meds. changing pads 4 x daily is ok. doing [place] tomorrow and the biopsy next week to find out reason for her bleeding. Pt wants [place] to give her something to relax her for the appt.  Discussed that she already has klonopin and can take this before appt. ---per Ta She wanted something more for pain.  Reinterated that she has tramadol three times a day for pain --per Ta Arlyss Repress CMA,  November 01, 2010 4:42 PM

## 2011-01-03 NOTE — Assessment & Plan Note (Signed)
Summary: feet swollen/[place]/Jordyn Doane   Vital Signs:  Patient profile:   35 year old female Weight:      157 pounds Temp:     98.6 degrees F oral Pulse rate:   119 / minute BP sitting:   146 / 89  (left arm) Cuff size:   regular  Vitals Entered By: Tessie Fass CMA (May 14, 2010 11:24 AM) CC: feet swollen   Primary Care Provider:  Nainika Newlun MD  CC:  feet swollen.  History of Present Illness: 35 y/o F with CP who presented to clinic with RLE edema x 1 week.  Pt has history of spontaneous PE in 09/2009 and was anticoagulated with coumadin until 04/2010.  She endorses some discomfort in R foot, but otherwise doing fine.  Pt denies shortness of breath, chest pain, palpitations, nausea, diaphoresis, vomiting.    Current Medications (verified): 1)  Baclofen 10 Mg Tabs (Baclofen) .... Take 1 Tablet By Mouth Four Times A Day 2)  Toprol Xl 100 Mg Tb24 (Metoprolol Succinate) .... Take 1 Tablet By Mouth Once A Day 3)  Seroquel 200 Mg  Tabs (Quetiapine Fumarate) .... One Tab By Mouth Qhs 4)  Effexor Xr 150 Mg Cp24 (Venlafaxine Hcl) .... One Tab By Mouth Qday Per Psych 5)  Depo-Provera 150 Mg/ml Im Susp (Medroxyprogesterone Acetate) .... Q2m 6)  Hydrocodone-Acetaminophen 5-325 Mg Tabs (Hydrocodone-Acetaminophen) .... One Tab By Mouth Q 3-4 Hrs As Needed Pain 7)  Lamictal 150 Mg Tabs (Lamotrigine) .... Per Dr Sharene Skeans 8)  Klonopin 1 Mg Tabs (Clonazepam) .... Per Dr Sharene Skeans 9)  Zanaflex 2 Mg Caps (Tizanidine Hcl) .... Per Dr Sharene Skeans 10)  Oxybutynin Chloride 5 Mg Tabs (Oxybutynin Chloride) .... One Tab By Mouth Three Times A Day For Overactive Bladder 11)  Aerochamber Mv  Misc (Spacer/aero-Holding Chambers) .... To Use With Mdi 12)  Ibuprofen 600 Mg Tabs (Ibuprofen) .[name] Kitchen.. 1 Tab By Mouth Two Times A Day As Needed Pain 13)  Fluticasone Propionate 0.05 % Crea (Fluticasone Propionate) .... As Needed 14)  Restoril 22.5 Mg Caps (Temazepam) .... At Bedtime 15)  Miralax  Powd (Polyethylene Glycol 3350) .... As  Needed 16)  Imitrex 50 Mg Tabs (Sumatriptan Succinate) .... As Needed 17)  Amlodipine Besylate 5 Mg Tabs (Amlodipine Besylate) .... Take One Tablet By Mouth Daily 18)  Albuterol Sulfate (2.5 Mg/59ml) 0.083% Nebu (Albuterol Sulfate) .... 2.5mg -5mg  Inhaled By Nebulizer Every 4 Hours As Needed Shortness of Breath.  Dispense 1. 19)  Nebulizer/adult Mask  Kit (Respiratory Therapy Supplies) .... Use As Directed With [employer].  Dispense 1 20)  Oxycodone-Acetaminophen 5-325 Mg Tabs (Oxycodone-Acetaminophen) .... 1/2 Tab By Mouth Every 3-4 Hours As Needed Pain.  Do Not Take With Hydrocodone or Tylenol 21)  Bacitracin 500 Unit/gm Oint (Bacitracin) .... Apply To Wounds Two Times A Day As Needed To Prevent Infection.  Large Op  Allergies (verified): No Known Drug Allergies  Past History:  Past Medical History: Last updated: 04/26/2010 1. ?migraines?--tx`d successfully w/ Imitrex (3/06) and prophylaxic by neuro 2. 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 DJD 4. Dysarthria with mild dysphagia 5. recurren UTI's  6. Endometriosis 7. GERD 8. H/o gestational DM w/ G2 9. H/o neurogenic bladder 10. 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 Right shin MRSA abcess requiring I&D (3/06) 12. PE-09/2009-anticoagulated for 7 months with Coumadin (d/c 04/2010) 13. sinus tachycardia 14. Altered mental status 2/2 hypoventilation from anxiety (01/2010, [facility])  Past Surgical History: Last updated: 03/22/2010 -Baclofen pump insertion  8/00 -Colposcopy--7/01--LGSIL - 07/08/2000 -C-section  x 2:  1997 & 2003 -BTL 2003 -Gallbladder U/S--normal  1/98 -,  -I&D of right LE MRSA abscess - 02/27/2005,  -Laproscopic excision of endometriosis and laproscopic uterosacral nerve ablation (LUNA) procedure 10/2001 -Left Carpal Tunnel Ligament Release 08/2008 by Dr Teressa Senter -Oral surgery for 10 teeth extraction 07/18/09 -EEG 01/2010 by Dr Sharene Skeans: normal, no seizure activities, 6 events that occurred were preceded by  hypoventitation   Family History: Last updated: 01/29/2007 No DM, CAD, HTN.  One GM d/o colon CA, the other of breast CA.  Social History: Last updated: 05/14/2010 Ailene Ards, who has schizophrenia, MR, is father of daughter.  Homero Fellers and pt are no longer together.  Homero Fellers does not see [name]. Daughter is 65 yo, Stage manager, lives with pt.  67, 40 yo son, lives with pt's parents. Needs assistance with ADL's and IADL's--has personal care services 20 hrs/wk; -Because of [name]'s worsening condition in fall 2005, her son, [name], born in 1997, now lives w/ [name]'s father  Medical sales representative wheelchair.; -No tobacco, EtOH, drugs.; -Patient very concerned about her kids being taken from her; -in aug 06 Gusta began taking care of 37 year old [name] again  Risk Factors: Smoking Status: never (05/01/2010)  Social History: Ailene Ards, who has schizophrenia, MR, is father of daughter.  Homero Fellers and pt are no longer together.  Homero Fellers does not see [name]. Daughter is 62 yo, Stage manager, lives with pt.  43, 62 yo son, lives with pt's parents. Needs assistance with ADL's and IADL's--has personal care services 20 hrs/wk; -Because of [name]'s worsening condition in fall 2005, her son, [name], born in 1997, now lives w/ [name]'s father  Medical sales representative wheelchair.; -No tobacco, EtOH, drugs.; -Patient very concerned about her kids being taken from her; -in aug 06 Keari began taking care of 37 year old [name] again  Review of Systems       The patient complains of peripheral edema.  The patient denies fever, weight loss, vision loss, hoarseness, chest pain, syncope, dyspnea on exertion, prolonged cough, headaches, hemoptysis, abdominal pain, and hematochezia.    Physical Exam  General:  Well-developed,well-nourished,in no acute distress; alert,appropriate and cooperative throughout examination. vitals reviewed Head:  normocephalic.   Left supraorbital with scab from fall 2 wks ago Mouth:  Oral mucosa and oropharynx  without lesions or exudates.   Neck:  supple and full ROM.   Lungs:  Normal respiratory effort, chest expands symmetrically. Lungs are clear to auscultation, no crackles or wheezes. Heart:  Normal rate and regular rhythm. S1 and S2 normal without gallop, murmur, click, rub or other extra sounds. Abdomen:  Bowel sounds positive,abdomen soft and non-tender without masses, organomegaly or hernias noted. Pulses:  R and L radial,dorsalis pedis  are full and equal bilaterally Extremities:  R calf 37 1/2 cm L calf 34 1/2 cm +1 edema bilaterally +warmth Bilateral leg in dorsiflexion position, but this is pt's norm  Neurologic:  alert & oriented X3.   Skin:  Intact without suspicious lesions or rashes Cervical Nodes:  No lymphadenopathy noted Psych:  Cognition and judgment appear intact. Alert and cooperative with normal attention span and concentration. No apparent delusions, illusions, hallucinations   Impression & Recommendations:  Problem # 1:  LEG EDEMA, BILATERAL (ICD-782.3) Assessment New Bilateral LE edema, but R > L.  I am concerned for DVT given pt's history of PE in 09/2009 (s/p coumadin therapy that was discontinued in May 2011).  [facility] to [facility] for LE Dopler for DVT.  If positive  will need lovenox with coumadin bridging.  Another concern is that pt has scheduled Left wrist surgery June 28 at [facility] for Left lunate collaspe with fragmentation.  Pt has been dealing with wrist pain x months, but of course DVT is more urgent.  Will need to discuss our options with the patient when we have Dopler result.  Another option is daily Lovenox therapy.   Orders: [facility]- Est Level  3 ([address]) LE Venous Duplex (DVT) (DVT)  Problem # 2:  WRIST PAIN, LEFT (ZOX-096.04) Assessment: Comment Only Left lunate collapse with fragmentation.  Followed by Dr Sarita Bottom, at [facility] for surgery to repair this.  Currently on Percocet for pain.    Problem # 3:  HYPERTENSION, BENIGN  (ICD-401.1)  Her updated medication list for this problem includes:    Toprol Xl 100 Mg Tb24 (Metoprolol succinate) .[name] Kitchen... Take 1 tablet by mouth once a day    Amlodipine Besylate 5 Mg Tabs (Amlodipine besylate) .[name] Kitchen... Take one tablet by mouth daily  Problem # 4:  CEREBRAL PALSY (ICD-343.9) Assessment: Unchanged Motorized wheel chair, mentation intact.  Fall risk.    Complete Medication List: 1)  Baclofen 10 Mg Tabs (Baclofen) .... Take 1 tablet by mouth four times a day 2)  Toprol Xl 100 Mg Tb24 (Metoprolol succinate) .... Take 1 tablet by mouth once a day 3)  Seroquel 200 Mg Tabs (quetiapine Fumarate)  .... One tab by mouth qhs 4)  Effexor Xr 150 Mg Cp24 (Venlafaxine hcl) .... One tab by mouth qday per psych 5)  Depo-provera 150 Mg/ml Im Susp (Medroxyprogesterone acetate) .... Q66m 6)  Hydrocodone-acetaminophen 5-325 Mg Tabs (Hydrocodone-acetaminophen) .... One tab by mouth q 3-4 hrs as needed pain 7)  Lamictal 150 Mg Tabs (Lamotrigine) .... Per dr Sharene Skeans 8)  Klonopin 1 Mg Tabs (Clonazepam) .... Per dr Sharene Skeans 9)  Zanaflex 2 Mg Caps (Tizanidine hcl) .... Per dr Sharene Skeans 10)  Oxybutynin Chloride 5 Mg Tabs (Oxybutynin chloride) .... One tab by mouth three times a day for overactive bladder 11)  Aerochamber Mv Misc (Spacer/aero-holding chambers) .... To use with mdi 12)  Ibuprofen 600 Mg Tabs (Ibuprofen) .[name] Kitchen.. 1 tab by mouth two times a day as needed pain 13)  Fluticasone Propionate 0.05 % Crea (Fluticasone propionate) .... As needed 14)  Restoril 22.5 Mg Caps (Temazepam) .... At bedtime 15)  Miralax Powd (Polyethylene glycol 3350) .... As needed 16)  Imitrex 50 Mg Tabs (Sumatriptan succinate) .... As needed 17)  Amlodipine Besylate 5 Mg Tabs (Amlodipine besylate) .... Take one tablet by mouth daily 18)  Albuterol Sulfate (2.5 Mg/36ml) 0.083% Nebu (Albuterol sulfate) .... 2.5mg -5mg  inhaled by nebulizer every 4 hours as needed shortness of breath.  dispense 1. 19)  Nebulizer/adult Mask Kit  (Respiratory therapy supplies) .... Use as directed with neb machine.  dispense 1 20)  Oxycodone-acetaminophen 5-325 Mg Tabs (Oxycodone-acetaminophen) .... 1/2 tab by mouth every 3-4 hours as needed pain.  do not take with hydrocodone or tylenol 21)  Bacitracin 500 Unit/gm Oint (Bacitracin) .... Apply to wounds two times a day as needed to prevent infection.  large op

## 2011-01-03 NOTE — Progress Notes (Signed)
Summary: phn msg   Phone Note Call from Patient Call back at Home Phone [phone]   Caller: Patient Summary of Call: pt is not happy about her care and wants to talk to nurse - about pain- getting worse - needs to be referred to GYN Initial call taken by: De Nurse,  November 05, 2010 8:48 AM  Follow-up for Phone Call        Spoke with pt- she states she is having her 3rd period in 1.5 months and cramping is severe.  Went to UC sat d/t pain- got an inj and this helped for approx 8 hrs.  Pt states now the vag bleeding has almost stopped, but her cramping is 8 or 9 out of 10 today.  She has taken 1 tramadol today- which she states mostly makes her sleepy.  Advised she can take 1 every 6-8 hours.   Advised pt there are no work in appts left today, if pain is intolerable suggested that she go back to UC to be seen today.  Offered her a [place] appt tomorrow if she feels she can wait.  Pt wanted to be seen as [place] tomorrow.  Per her req sched her @ 1:30 pm.  Pt states she would like a GYN appt to address her pain and vag bleeding.  Advised pt to keep appt thurs for colpo clinic. Follow-up by: Rochele Pages RN,  November 05, 2010 2:12 PM

## 2011-01-03 NOTE — Letter (Signed)
Summary: *Referral Letter  [facility]  [address]   [place], [place] [address]   Phone: [phone]  Fax: [phone]    01/03/2010  Thank you in advance for agreeing to see my patient:  [name] [name] [address] Shaune Pollack [place], [place]  [address]  Phone: [phone]  Reason for Referral:  35 y/o Female with spastic CP with chronic left wrist pain.  Patient is s/p CTR procedure by Dr. Teressa Senter in 08/2008.  She has been released by his office.  She has tried PT/OT without relief.  She has tried to wear a splint, but this causes muscle spasms.  Left wrist pain may be due to contracture due to CP.  I have asked her to bring her splint to the appointment.     Current Medications: 1)  BACLOFEN 10 MG TABS (BACLOFEN) Take 1 tablet by mouth four times a day 2)  TOPROL XL 100 MG TB24 (METOPROLOL SUCCINATE) Take 1 tablet by mouth once a day 3)  * SEROQUEL 200 MG  TABS (QUETIAPINE FUMARATE) one tab by mouth qhs 4)  EFFEXOR XR 150 MG CP24 (VENLAFAXINE HCL) one tab by mouth qday per psych 5)  DEPO-PROVERA 150 MG/ML IM SUSP (MEDROXYPROGESTERONE ACETATE) q44m 6)  HYDROCODONE-ACETAMINOPHEN 5-325 MG TABS (HYDROCODONE-ACETAMINOPHEN) one tab by mouth q 4 hrs as needed pan 7)  ALBUTEROL SULFATE (2.5 MG/3ML) 0.083% NEBU (ALBUTEROL SULFATE) breathing treatment qid as needed , use for a least one month, QS for 1 months 8)  TYLENOL WITH CODEINE #3 300-30 MG TABS (ACETAMINOPHEN-CODEINE) oen qid as needed cough suprression 9)  LIDOCAINE VISCOUS 2 % SOLN (LIDOCAINE HCL) paint small amount on tongue with cotton swab up to qid as needed for pain.  Give 1 small bottle 10)  LAMICTAL 150 MG TABS (LAMOTRIGINE) per Dr Sharene Skeans 11)  KLONOPIN 1 MG TABS (CLONAZEPAM) per Dr Sharene Skeans 12)  TOPIRAMATE 25 MG TABS (TOPIRAMATE) Per Dr Sharene Skeans 13)  ZANAFLEX 2 MG CAPS (TIZANIDINE HCL) Per Dr Sharene Skeans 14)  COUMADIN 5 MG TABS (WARFARIN SODIUM) Take as directed 15)  OXYBUTYNIN CHLORIDE 5 MG TABS  (OXYBUTYNIN CHLORIDE) one tab by mouth three times a day for overactive bladder 16)  VENTOLIN HFA 108 (90 BASE) MCG/ACT AERS (ALBUTEROL SULFATE) 2 puff every 4 hours as needed for shortness of breath 17)  AEROCHAMBER MV  MISC (SPACER/AERO-HOLDING CHAMBERS) to use with MDI 18)  TESSALON PERLES 100 MG CAPS (BENZONATATE) take 1-2 tablets by mouth every 8 hours as needed for cough. 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  Sincerely,  Danyell Awbrey MD

## 2011-01-03 NOTE — Assessment & Plan Note (Signed)
Summary: K[facility]   Vital Signs:  Patient profile:   35 year old female Height:      59 inches Weight:      163 pounds BMI:     33.04 Temp:     98.0 degrees F oral Pulse rate:   80 / minute BP sitting:   112 / 74  (right arm) Cuff size:   regular  Vitals Entered By: Tessie Fass CMA (Apr 11, 2010 2:26 PM) CC: F/U Is Patient Diabetic? No Pain Assessment Patient in pain? yes     Location: left arm Intensity: 10   Primary Care Provider:  November Sypher MD  CC:  F/U.  History of Present Illness: 35 y/o F with spastic cerebral palsy (full cognition) who has contracture of bilateral hands.  She is only able to use her left hand to operate her motorized vehicle, use phone, etc.  She is s/p carpal tunnel release by Dr Teressa Senter in 2009.  She did have a few months of pain relief after the procedure.  She has since complained of left hand/wrist pain.  She has been on chronic darvocet (now on vicodin).  She has also had steroid injections by [facility].  She recently saw Dr Roanna Epley, who ordered L hand films, which showed: 1.  Widened scapholunate distance suggests ligamentous injury. 2.  Lucency within the triquetrum, and bony density on the lateral view dorsally may represent prior triquetral trauma and small fracture fragment versus erosion.    I have asked Dr Teressa Senter to see Ms Mould again.  Dr Teressa Senter has since seen her states that this is "very complex problem with avascular necrosis and stress fracture due to athetoid movements.  Athetoid movements would need to be controled (by Botox paralysis) prior to surg.  Needs tertiary care referral - [facility]  Suggests Dr. Boris Sharper neuro at [facility].  Two hand surg Dr. Gerlene Burdock or Dr. Jefm Petty.  First refer to hand surg."    We tried to call [facility] and the hand surgeons that Dr Teressa Senter recommended.  They do not accept Medicaid.  They recommended that pt be seen in the [facility] at [facility].  Discussed this with the patient.  We discussed  that this process may take a few weeks to months.  In the meantime we will try to control her pain with hydrocodone.  Pt states that Dr Teressa Senter Rx Percocet and she is going to try that.    Current Medications (verified): 1)  Baclofen 10 Mg Tabs (Baclofen) .... Take 1 Tablet By Mouth Four Times A Day 2)  Toprol Xl 100 Mg Tb24 (Metoprolol Succinate) .... Take 1 Tablet By Mouth Once A Day 3)  Seroquel 200 Mg  Tabs (Quetiapine Fumarate) .... One Tab By Mouth Qhs 4)  Effexor Xr 150 Mg Cp24 (Venlafaxine Hcl) .... One Tab By Mouth Qday Per Psych 5)  Depo-Provera 150 Mg/ml Im Susp (Medroxyprogesterone Acetate) .... Q40m 6)  Hydrocodone-Acetaminophen 5-325 Mg Tabs (Hydrocodone-Acetaminophen) .... One Tab By Mouth Q 4 Hrs As Needed Pan 7)  Lamictal 150 Mg Tabs (Lamotrigine) .... Per Dr Sharene Skeans 8)  Klonopin 1 Mg Tabs (Clonazepam) .... Per Dr Sharene Skeans 9)  Zanaflex 2 Mg Caps (Tizanidine Hcl) .... Per Dr Sharene Skeans 10)  Oxybutynin Chloride 5 Mg Tabs (Oxybutynin Chloride) .... One Tab By Mouth Three Times A Day For Overactive Bladder 11)  Aerochamber Mv  Misc (Spacer/aero-Holding Chambers) .... To Use With Mdi 12)  Ibuprofen 600 Mg Tabs (Ibuprofen) .[name] KitchenMarland KitchenMarland Kitchen 1  Tab By Mouth Two Times A Day As Needed Pain 13)  Fluticasone Propionate 0.05 % Crea (Fluticasone Propionate) .... As Needed 14)  Restoril 22.5 Mg Caps (Temazepam) .... At Bedtime 15)  Miralax  Powd (Polyethylene Glycol 3350) .... As Needed 16)  Imitrex 50 Mg Tabs (Sumatriptan Succinate) .... As Needed 17)  Amlodipine Besylate 5 Mg Tabs (Amlodipine Besylate) .... Take One Tablet By Mouth Daily  Allergies (verified): No Known Drug Allergies  Past History:  Past Medical History: Last updated: 03/22/2010 1. ?migraines?--tx`d successfully w/ Imitrex (3/06) and prophylaxic by neuro 2. 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 DJD 4. Dysarthria with mild dysphagia 5. recurren UTI's  6. Endometriosis 7. GERD 8. H/o gestational DM w/  G2 9. H/o neurogenic bladder 10. 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 Right shin MRSA abcess requiring I&D (3/06) 12. PE-09/2009-anticoagulated 13. sinus tachycardia  Past Surgical History: Last updated: 03/22/2010 -Baclofen pump insertion  8/00 -Colposcopy--7/01--LGSIL - 07/08/2000 -C-section x 2:  1997 & 2003 -BTL 2003 -Gallbladder U/S--normal  1/98 -,  -I&D of right LE MRSA abscess - 02/27/2005,  -Laproscopic excision of endometriosis and laproscopic uterosacral nerve ablation (LUNA) procedure 10/2001 -Left Carpal Tunnel Ligament Release 08/2008 by Dr Teressa Senter -Oral surgery for 10 teeth extraction 07/18/09 -EEG 01/2010 by Dr Sharene Skeans: normal, no seizure activities, 6 events that occurred were preceded by hypoventitation   Family History: Last updated: 01/29/2007 No DM, CAD, HTN.  One GM d/o colon CA, the other of breast CA.  Social History: Last updated: 02/22/2009 Ailene Ards, who has schizophrenia, MR, is father of daughter.  Homero Fellers and pt are no longer together.  Homero Fellers does not see [name]. Daughter is 56 yo, Stage manager, lives with pt.  16, 45 yo son, lives with pt's parents. Needs assistance with ADL's and IADL's--has personal care services 20 hrs/wk; -Because of [name]'s worsening condition in fall 2005, her son, [name], born in 1997, now lives w/ [name]'s father  Medical sales representative wheelchair.; -No tobacco, EtOH, drugs.; -Patient very concerned about her kids being taken from her; -in aug 06 [place] began taking care of 37 year old [name] again; -going to [employer] for criminal justice, 4 classes left as of fall'06  Risk Factors: Smoking Status: never (12/06/2009)  Physical Exam  General:  Well-developed,well-nourished,in no acute distress; alert,appropriate and cooperative throughout examination. vitals reviewed.  Lungs:  Normal respiratory effort, chest expands symmetrically. Lungs are clear to auscultation, no crackles or wheezes. Heart:  Normal rate and regular rhythm. S1 and S2 normal without  gallop, murmur, click, rub or other extra sounds.   Wrist/Hand Exam  General:    Left hand/wrist with permanent 30 degree flexion at wrist.   Skin:    Intact with no erythema; no scarring.    Inspection:    +swellling  Palpation:    + tender to palpation   Vascular:    Radial 2+  Sensory:    Gross sensation intact in the upper extremities.     Impression & Recommendations:  Problem # 1:  WRIST PAIN, LEFT (NWG-956.21) Assessment Unchanged see hpi  Orders: Orthopedic Surgeon Referral (Ortho Surgeon) [facility]- Est Level  3 [phone])  Problem # 2:  PULMONARY EMBOLISM (ICD-415.19) Assessment: Unchanged Dx in Oct 2010.  Pt has been on coumadin since then (7 months).  We discussed the risks/benefits of longterm coumadin and stopping it.  This is also important as pt is looking at another surgery for her hand/wrist.  We agreed to stop coumadin today.  Red flags for  symptoms to return to ER (shortness of breath, chest pain, etc).    The following medications were removed from the medication list:    Coumadin 5 Mg Tabs (Warfarin sodium) .[name] Kitchen... Take as directed  Orders: [facility]- Est Level  3 (99213)Future Orders: [facility] (16109) ... 04/12/2011  Her updated medication list for this problem includes:    Coumadin 5 Mg Tabs (Warfarin sodium) .[name] Kitchen... Take as directed  Complete Medication List: 1)  Baclofen 10 Mg Tabs (Baclofen) .... Take 1 tablet by mouth four times a day 2)  Toprol Xl 100 Mg Tb24 (Metoprolol succinate) .... Take 1 tablet by mouth once a day 3)  Seroquel 200 Mg Tabs (quetiapine Fumarate)  .... One tab by mouth qhs 4)  Effexor Xr 150 Mg Cp24 (Venlafaxine hcl) .... One tab by mouth qday per psych 5)  Depo-provera 150 Mg/ml Im Susp (Medroxyprogesterone acetate) .... Q12m 6)  Hydrocodone-acetaminophen 5-325 Mg Tabs (Hydrocodone-acetaminophen) .... One tab by mouth q 4 hrs as needed pan 7)  Lamictal 150 Mg Tabs (Lamotrigine) .... Per dr Sharene Skeans 8)  Klonopin 1 Mg Tabs  (Clonazepam) .... Per dr Sharene Skeans 9)  Zanaflex 2 Mg Caps (Tizanidine hcl) .... Per dr Sharene Skeans 10)  Oxybutynin Chloride 5 Mg Tabs (Oxybutynin chloride) .... One tab by mouth three times a day for overactive bladder 11)  Aerochamber Mv Misc (Spacer/aero-holding chambers) .... To use with mdi 12)  Ibuprofen 600 Mg Tabs (Ibuprofen) .[name] Kitchen.. 1 tab by mouth two times a day as needed pain 13)  Fluticasone Propionate 0.05 % Crea (Fluticasone propionate) .... As needed 14)  Restoril 22.5 Mg Caps (Temazepam) .... At bedtime 15)  Miralax Powd (Polyethylene glycol 3350) .... As needed 16)  Imitrex 50 Mg Tabs (Sumatriptan succinate) .... As needed 17)  Amlodipine Besylate 5 Mg Tabs (Amlodipine besylate) .... Take one tablet by mouth daily  Other Orders: Future Orders: Comp [facility] ([id number]) ... 04/13/2011   Prevention & Chronic Care Immunizations   Influenza vaccine: Fluvax 3+  (10/05/2009)   Influenza vaccine due: 11/09/2009    Tetanus booster: 03/18/2008: Tdap [facility])   Tetanus booster due: 03/18/2018    Pneumococcal vaccine: Not documented  Other Screening   Pap smear: NEGATIVE FOR INTRAEPITHELIAL LESIONS OR MALIGNANCY.  (02/22/2009)   Pap smear action/deferral: Deferred-3 yr interval  (04/11/2010)   Pap smear due: 10/03/2007   Smoking status: never  (12/06/2009)  Lipids   Total Cholesterol: 150  (01/29/2007)   LDL: 92  (01/29/2007)   LDL Direct: Not documented   HDL: 41  (01/29/2007)   Triglycerides: 83  (01/29/2007)  Hypertension   Last Blood Pressure: 112 / 74  (04/11/2010)   Serum creatinine: 0.77  (07/25/2009)   Serum potassium 4.0  (07/25/2009) CMP ordered   Self-Management Support :    Hypertension self-management support: Not documented

## 2011-01-03 NOTE — Miscellaneous (Signed)
Summary: order for Hand Surgery referral.   Clinical Lists Changes  Orders: Added new Referral order of Orthopedic Surgeon Referral (Ortho Surgeon) - Signed

## 2011-01-03 NOTE — Letter (Signed)
Summary: [facility]   [facility]   Imported By: Roderic Ovens 02/28/2010 16:29:53  _____________________________________________________________________  External Attachment:    Type:   Image     Comment:   External Document

## 2011-01-03 NOTE — Progress Notes (Signed)
Summary: phn msg   Phone Note Call from Patient Call back at Home Phone [phone]   Caller: Patient Summary of Call: pt is needing to talk to nurse Initial call taken by: De Nurse,  Apr 13, 2010 9:10 AM  Follow-up for Phone Call        wanted to know if we could get her in to [facility] sooner than 05/02/10. told her unlikely as we pushed to get that one. c/o pain "unlike any I have ever had" taking the narcotic which do help. does not need anything at this time Follow-up by: Golden Circle RN,  Apr 13, 2010 9:17 AM

## 2011-01-03 NOTE — Assessment & Plan Note (Signed)
Summary: nausea & HA/[place]/Ta   Vital Signs:  Patient profile:   35 year old female Weight:      148.2 pounds Temp:     98.3 degrees F Pulse rate:   108 / minute BP sitting:   128 / 82  (left arm)  Vitals Entered By: Theresia Lo RN (September 07, 2010 2:12 PM) CC: headache and nausea, Headache Is Patient Diabetic? No Pain Assessment Patient in pain? yes     Location: head Intensity: 3 Type: aching   Primary Provider:  CAT TA MD  CC:  headache and nausea and Headache.  History of Present Illness: Pt comes in with continues headache and nausea after recent hospitalization (d/c'ed Mon 9/26 from Dr. Darl Householder service for migraine treatment)  1. Nausea: Pt is concerned because she has lost 3# in the past 2 weeks. States she has not been able to eat or drink since last Friday (7 days) but states that she was able to eat a sandiwch and was drinking a soda while in the room.  No vomiting since before hospitalization.  Decreased appetite.  Does not think that her Phenergan is helping.  Last BM was a week ago. Has been passing gas (pt says it has been a lot lately). No stomach pain, just nausea.  Has not been taking her Miralax because of the nausea. Has not taken it since before hospitalization.   2. Migraine headache: Now 2/10. Is not primarily concerned with this, however. Has been taking Imitrex BID, on d/c had no headache but came back last Friday. Still some phonophobia but very little photophobia. (pain was 8/10 at hospitalization)  3. Nasal congestion:Stuffy nose and "stuff" in the back of her throat since d/c.  No nose bleeds. No cough or SOB. No wheezing.    Current Problems (verified): 1)  Nausea  (ICD-787.02) 2)  Skin Rash  (ICD-782.1) 3)  Headache  (ICD-784.0) 4)  Fatigue  (ICD-780.79) 5)  Wrist Pain, Left  (ICD-719.43) 6)  Hypertension, Benign  (ICD-401.1) 7)  Syncope and Collapse  (ICD-780.2) 8)  Chest Pain-unspecified  (ICD-786.50) 9)  Tachycardia  (ICD-785) 10)   Cerebral Palsy  (ICD-343.9) 11)  Contracture of Joint of Multiple Sites  (ICD-718.49) 12)  Pulmonary Embolism, Hx of  (ICD-V12.51) 13)  Endometriosis  (ICD-617.9) 14)  Migraine Headache  (ICD-346.90) 15)  Cystitis, Recurrent  (ICD-595.9) 16)  Obsessive Compul. Disorder  (ICD-300.3) 17)  Depression, Major, Recurrent  (ICD-296.30) 18)  Pressure Ulcer Stage II  (ICD-707.22) 19)  Screening For Malignant Neoplasm of The Cervix  (ICD-V76.2)  Current Medications (verified): 1)  Baclofen 10 Mg Tabs (Baclofen) .... Take 1 Tablet By Mouth Four Times A Day 2)  Toprol Xl 100 Mg Tb24 (Metoprolol Succinate) .... Take 1 Tablet By Mouth Once A Day 3)  Seroquel 200 Mg  Tabs (Quetiapine Fumarate) .... One Tab By Mouth Qhs 4)  Effexor Xr 150 Mg Cp24 (Venlafaxine Hcl) .... One Tab By Mouth Qday Per Psych 5)  Depo-Provera 150 Mg/ml Im Susp (Medroxyprogesterone Acetate) .... Q34m 6)  Lamictal 150 Mg Tabs (Lamotrigine) .... Per Dr Sharene Skeans 7)  Klonopin 1 Mg Tabs (Clonazepam) .... Per Dr Sharene Skeans 8)  Zanaflex 2 Mg Caps (Tizanidine Hcl) .... Per Dr Sharene Skeans 9)  Oxybutynin Chloride 5 Mg Tabs (Oxybutynin Chloride) .... One Tab By Mouth Three Times A Day For Overactive Bladder 10)  Aerochamber Mv  Misc (Spacer/aero-Holding Chambers) .... To Use With Mdi 11)  Fluticasone Propionate 0.05 % Crea (Fluticasone Propionate) .... As  Needed 12)  Restoril 22.5 Mg Caps (Temazepam) .... At Bedtime 13)  Miralax  Powd (Polyethylene Glycol 3350) .... As Needed 14)  Imitrex 50 Mg Tabs (Sumatriptan Succinate) .[name] Kitchen.. 1 Tab By Mouth At Onset of Headache. Can Repeat in 2 Hours If Headache Persists.  Max 200mg  (4 Tabs Per Day) 15)  Amlodipine Besylate 5 Mg Tabs (Amlodipine Besylate) .... Take One Tablet By Mouth Daily 16)  Albuterol Sulfate (2.5 Mg/82ml) 0.083% Nebu (Albuterol Sulfate) .... 2.5mg -5mg  Inhaled By Nebulizer Every 4 Hours As Needed Shortness of Breath.  Dispense 1. 17)  Nebulizer/adult Mask  Kit (Respiratory Therapy Supplies)  .... Use As Directed With [employer].  Dispense 1 18)  Bacitracin 500 Unit/gm Oint (Bacitracin) .... Apply To Wounds Two Times A Day As Needed To Prevent Infection.  Large Op 19)  Vicodin Hp 10-660 Mg Tabs (Hydrocodone-Acetaminophen) .... Take 1 Tab Daily As Needed Severe Pain 20)  Ibuprofen 600 Mg Tabs (Ibuprofen) .[name] Kitchen.. 1 Tab By Mouth Three Times A Day As Needed Pain 21)  Salicylic Acid 6 % Crea (Salicylic Acid) .... Apply To Affected Areas At Bedtime. Rinse Off in Morning.  Dispense 30 Grams. 22)  Zyrtec Allergy 10 Mg Tabs (Cetirizine Hcl) .[name] Kitchen.. 1 Tab By Mouth Daily For Itchiness 23)  Zofran 4 Mg Tabs (Ondansetron Hcl) .[name] Kitchen.. 1-2 Tabs Every 4-6 Hrs As Needed Nausea 24)  Docusate Sodium 100 Mg Caps (Docusate Sodium) .[name] Kitchen.. 1 Cap Two Times A Day As Needed Constipation 25)  Fleet Bisacodyl 10 Mg/13ml Enem (Bisacodyl) .[name] Kitchen.. 1 Suppository Rectally Once Per Day Until Large Bowel Movement 26)  Magnesium Citrate 1.745 Gm/73ml Soln (Magnesium Citrate) .[name] KitchenMarland KitchenMarland Kitchen 150-347ml By Mouth Daily As Needed Constipation  Allergies (verified): No Known Drug Allergies  Physical Exam  General:  alert and well-hydrated.   Eyes:  vision grossly intact, pupils equal, pupils round, and pupils reactive to light.   Nose:  no external erythema and nasal discharge, mucosal pallor.  no external erythema, nasal dischargemucosal pallor, and mucosal edema.   Mouth:  pharyngeal erythema, poor dentition, and teeth missing.   Lungs:  normal respiratory effort, normal breath sounds, no crackles, and no wheezes.   Heart:  normal rate, regular rhythm, no murmur, and no gallop.   Abdomen:  soft, non-tender, normal bowel sounds, no distention, no guarding, no rigidity, and no rebound tenderness.   Extremities:  contractures, baseline.   Impression & Recommendations:  Problem # 1:  NAUSEA (ICD-787.02) Assessment Deteriorated Likely combination of constipation and migraine side effect.  Given Zofran 4mg  by mouth in office, subjectively  improved nausea.  Given Rx for zofran.  Larger focus on constipation as cause for nausea. Pt advised to have fleet's enema inserted. If not able to have someone insert enema (pt is unable to do so herself due to CP), Mag citrate by mouth can be used as well as docusate.  No concern for obstruction 2/2 + flatus and benign abdominal exam.  Advised pt not to take miralax since it can cause increased abdominal gas pains and will likely not be useful until pt has a large BM.   Her updated medication list for this problem includes:    Zofran 4 Mg Tabs (Ondansetron hcl) .[name] Kitchen... 1-2 tabs every 4-6 hrs as needed nausea  Orders: [facility]- Est Level  3 (82956)  Problem # 2:  MIGRAINE HEADACHE (ICD-346.90) Assessment: Unchanged Improved compared to last visit. Taking imitrex. No need for medication changes. Will f/u with Dr. Sharene Skeans.  Her updated medication list for this problem  includes:    Toprol Xl 100 Mg Tb24 (Metoprolol succinate) .[name] Kitchen... Take 1 tablet by mouth once a day    Imitrex 50 Mg Tabs (Sumatriptan succinate) .[name] Kitchen... 1 tab by mouth at onset of headache. can repeat in 2 hours if headache persists.  max 200mg  (4 tabs per day)    Vicodin Hp 10-660 Mg Tabs (Hydrocodone-acetaminophen) .[name] Kitchen... Take 1 tab daily as needed severe pain    Ibuprofen 600 Mg Tabs (Ibuprofen) .[name] Kitchen... 1 tab by mouth three times a day as needed pain  Orders: Zofran 4mg  Tab (EMRORAL) [facility]- Est Level  3 (81191)  Problem # 3:  VIRAL URI (ICD-465.9) Assessment: New Congestion likely due to viral URI. <7 days, unlikely to be bacteria, no fevers or sinus tenderness.  CT scan while in house with no evidence of sinus infection.  Advised nasal saline spray as needed for drainage.  Explained that I did not want to start new by mouth decongestant b/c of nausea.   Her updated medication list for this problem includes:    Ibuprofen 600 Mg Tabs (Ibuprofen) .[name] Kitchen... 1 tab by mouth three times a day as needed pain    Zyrtec Allergy 10 Mg Tabs (Cetirizine  hcl) .[name] Kitchen... 1 tab by mouth daily for itchiness  Problem # 4:  HYPERTENSION, BENIGN (ICD-401.1) Assessment: Unchanged Stable. No med changes.  Her updated medication list for this problem includes:    Toprol Xl 100 Mg Tb24 (Metoprolol succinate) .[name] Kitchen... Take 1 tablet by mouth once a day    Amlodipine Besylate 5 Mg Tabs (Amlodipine besylate) .[name] Kitchen... Take one tablet by mouth daily  Complete Medication List: 1)  Baclofen 10 Mg Tabs (Baclofen) .... Take 1 tablet by mouth four times a day 2)  Toprol Xl 100 Mg Tb24 (Metoprolol succinate) .... Take 1 tablet by mouth once a day 3)  Seroquel 200 Mg Tabs (quetiapine Fumarate)  .... One tab by mouth qhs 4)  Effexor Xr 150 Mg Cp24 (Venlafaxine hcl) .... One tab by mouth qday per psych 5)  Depo-provera 150 Mg/ml Im Susp (Medroxyprogesterone acetate) .... Q98m 6)  Lamictal 150 Mg Tabs (Lamotrigine) .... Per dr Sharene Skeans 7)  Klonopin 1 Mg Tabs (Clonazepam) .... Per dr hickling 8)  Zanaflex 2 Mg Caps (Tizanidine hcl) .... Per dr Sharene Skeans 9)  Oxybutynin Chloride 5 Mg Tabs (Oxybutynin chloride) .... One tab by mouth three times a day for overactive bladder 10)  Aerochamber Mv Misc (Spacer/aero-holding chambers) .... To use with mdi 11)  Fluticasone Propionate 0.05 % Crea (Fluticasone propionate) .... As needed 12)  Restoril 22.5 Mg Caps (Temazepam) .... At bedtime 13)  Miralax Powd (Polyethylene glycol 3350) .... As needed 14)  Imitrex 50 Mg Tabs (Sumatriptan succinate) .[name] Kitchen.. 1 tab by mouth at onset of headache. can repeat in 2 hours if headache persists.  max 200mg  (4 tabs per day) 15)  Amlodipine Besylate 5 Mg Tabs (Amlodipine besylate) .... Take one tablet by mouth daily 16)  Albuterol Sulfate (2.5 Mg/74ml) 0.083% Nebu (Albuterol sulfate) .... 2.5mg -5mg  inhaled by nebulizer every 4 hours as needed shortness of breath.  dispense 1. 17)  Nebulizer/adult Mask Kit (Respiratory therapy supplies) .... Use as directed with neb machine.  dispense 1 18)  Bacitracin 500  Unit/gm Oint (Bacitracin) .... Apply to wounds two times a day as needed to prevent infection.  large op 19)  Vicodin Hp 10-660 Mg Tabs (Hydrocodone-acetaminophen) .... Take 1 tab daily as needed severe pain 20)  Ibuprofen 600 Mg Tabs (Ibuprofen) .[name] Kitchen.. 1 tab by  mouth three times a day as needed pain 21)  Salicylic Acid 6 % Crea (Salicylic acid) .... Apply to affected areas at bedtime. rinse off in morning.  dispense 30 grams. 22)  Zyrtec Allergy 10 Mg Tabs (Cetirizine hcl) .[name] Kitchen.. 1 tab by mouth daily for itchiness 23)  Zofran 4 Mg Tabs (Ondansetron hcl) .[name] Kitchen.. 1-2 tabs every 4-6 hrs as needed nausea 24)  Docusate Sodium 100 Mg Caps (Docusate sodium) .[name] Kitchen.. 1 cap two times a day as needed constipation 25)  Fleet Bisacodyl 10 Mg/60ml Enem (Bisacodyl) .[name] Kitchen.. 1 suppository rectally once per day until large bowel movement 26)  Magnesium Citrate 1.745 Gm/44ml Soln (Magnesium citrate) .[name] KitchenMarland KitchenMarland Kitchen 150-335ml by mouth daily as needed constipation  Patient Instructions: 1)  I'm sorry to hear that you are so nauseated. We are going to do a couple of things for this: 2)        -- I am giving you a prescription for a different medicine to help with your nausea called Zofran. Hopefully this one helps more than the Phenergan.  3)        --Have someone give you 1-3 fleets soap subs enemas to help you have a big bowel movement if you can. 4)        -- If no one can give you an enema, or if the enema isn't working, you can try Magnesium Citrate (this is very cheap and over the counter at [place], I'm also giving you a prescription) 5)        --Also start taking the stool softener every day while you are doing this 6)        -- If you want, you can do BOTH the enema AND the Magnesium citrate 7)  STOP taking Miralax, do not start your Miralax until you come back and see [place]. 8)  You can use some saline (salt water) nasal spray for your stuffy nose. 9)  Stop at the front and make an appt with Dr. Janalyn Harder for NEXT WEEK to follow up on your  nausea and constipation. Prescriptions: MAGNESIUM CITRATE 1.745 GM/30ML SOLN (MAGNESIUM CITRATE) 150-33mL by mouth daily as needed constipation  #1 QS x 7 day x 1   Entered and Authorized by:   Demetria Pore MD   Signed by:   Demetria Pore MD on 09/07/2010   Method used:   Electronically to        [facility] (retail)       [address]       [place], [place]  [address]       Ph: [phone]       Fax: [phone]   RxID:   [id number] DOCUSATE SODIUM 100 MG CAPS (DOCUSATE SODIUM) 1 cap two times a day as needed constipation  #30 x 0   Entered and Authorized by:   Demetria Pore MD   Signed by:   Demetria Pore MD on 09/07/2010   Method used:   Electronically to        [facility] (retail)       [address]       [place], [place]  [address]       Ph: [phone]       Fax: [phone]   RxID:   [id number] FLEET BISACODYL 10 MG/30ML ENEM (BISACODYL) 1 suppository rectally once per day until large bowel movement  #4 x 0   Entered and Authorized by:   Demetria Pore MD   Signed by:  Demetria Pore MD on 09/07/2010   Method used:   Electronically to        [facility]* (retail)       [address]       [place], [place]  [address]       Ph: [phone]       Fax: [phone]   RxID:   [id number] ZOFRAN 4 MG TABS (ONDANSETRON HCL) 1-2 tabs every 4-6 hrs as needed nausea  #30 x 0   Entered and Authorized by:   Demetria Pore MD   Signed by:   Demetria Pore MD on 09/07/2010   Method used:   Electronically to        [facility] (retail)       [address]       [place], [place]  [address]       Ph: [phone]       Fax: [phone]   RxID:   [id number]    Medication Administration  Medication # 1:    Medication: Zofran 4mg  Tab    Diagnosis: MIGRAINE HEADACHE (ICD-346.90)    Dose: 4mg     Route: po    Exp Date:  11/01/2011    Lot #: [id number]    Mfr: udl    Patient tolerated medication without complications    Given by: Loralee Pacas CMA (September 07, 2010 2:55 PM)  Orders Added: 1)  Zofran 4mg  Tab [EMRORAL] 2)  [facility]- Est Level  3 [99213]  Appended Document: nausea & HA/[place]/Ta Medications Added FLEET BISACODYL 10 MG/30ML ENEM (BISACODYL) 1 enema rectally once per day until large bowel movement.          Clinical Lists Changes  Medications: Changed medication from FLEET BISACODYL 10 MG/30ML ENEM (BISACODYL) 1 suppository rectally once per day until large bowel movement to FLEET BISACODYL 10 MG/30ML ENEM (BISACODYL) 1 enema rectally once per day until large bowel movement. - Signed Rx of FLEET BISACODYL 10 MG/30ML ENEM (BISACODYL) 1 enema rectally once per day until large bowel movement.;  #4 x 0;  Signed;  Entered by: Sarah Swaziland MD;  Authorized by: Sarah Swaziland MD;  Method used: Electronically to [facility] [address], [place], [place]  [address], Ph: [phone], Fax: [phone]    Prescriptions: FLEET BISACODYL 10 MG/30ML ENEM (BISACODYL) 1 enema rectally once per day until large bowel movement.  #4 x 0   Entered and Authorized by:   Sarah Swaziland MD   Signed by:   Sarah Swaziland MD on 09/11/2010   Method used:   Electronically to        [facility] (retail)       [address]       [place], [place]  [address]       Ph: [phone]       Fax: [phone]   RxID:   [id number]

## 2011-01-03 NOTE — Letter (Signed)
Summary: [facility]  [facility]   Imported By: Knox Royalty 06/23/2010 12:58:53  _____________________________________________________________________  External Attachment:    Type:   Image     Comment:   External Document

## 2011-01-03 NOTE — Progress Notes (Signed)
Summary: Nuclear Pre-Procedure  Phone Note Outgoing Call Call back at Home Phone [phone]   Call placed by: Stanton Kidney, EMT-P,  February 06, 2010 3:24 PM Call placed to: Patient Action Taken: Phone Call Completed Summary of Call: Reviewed information on [employer] Information Sheet (see scanned document for further details).  Spoke with Patient.    Nuclear Med Background Indications for Stress Test: Evaluation for Ischemia   History: Echo  History Comments: 10/07 Echo: EF= 65%  Symptoms: Chest Pain, Dizziness, Syncope    Nuclear Pre-Procedure Cardiac Risk Factors: Hypertension Height (in): 59

## 2011-01-03 NOTE — Assessment & Plan Note (Signed)
Summary: f/up,tcb   Vital Signs:  Patient profile:   35 year old female Height:      59 inches Weight:      151 pounds BMI:     30.61 Pulse rate:   101 / minute BP sitting:   133 / 87  (left arm) Cuff size:   regular  Vitals Entered By: Tessie Fass CMA (September 21, 2010 11:34 AM) CC: F/U nausea   Primary Care Provider:  CAT TA MD  CC:  F/U nausea.  History of Present Illness: 35 y/o F with CP and other medical issues is here for   F/u nausea.  Dr Earnest Bailey saw her for this and ordered axr that showed stool burden.  She recommended that pt take miralax three times a day and enema daily until stool clears.  She has not done this.  She is taking miralax once daily and used enema for only one day.  Not having daily BM, having BMs every other day or every 3 days States that nausea is a little better.   Current Medications (verified): 1)  Baclofen 10 Mg Tabs (Baclofen) .... Take 1 Tablet By Mouth Four Times A Day 2)  Toprol Xl 100 Mg Tb24 (Metoprolol Succinate) .... Take 1 Tablet By Mouth Once A Day 3)  Seroquel 200 Mg  Tabs (Quetiapine Fumarate) .... One Tab By Mouth Qhs 4)  Depo-Provera 150 Mg/ml Im Susp (Medroxyprogesterone Acetate) .... Q82m 5)  Lamictal 150 Mg Tabs (Lamotrigine) .... Per Dr Sharene Skeans 6)  Klonopin 1 Mg Tabs (Clonazepam) .... Per Dr Sharene Skeans 7)  Zanaflex 2 Mg Caps (Tizanidine Hcl) .... Per Dr Sharene Skeans 8)  Oxybutynin Chloride 5 Mg Tabs (Oxybutynin Chloride) .... One Tab By Mouth Three Times A Day For Overactive Bladder 9)  Aerochamber Mv  Misc (Spacer/aero-Holding Chambers) .... To Use With Mdi 10)  Miralax  Powd (Polyethylene Glycol 3350) .... As Needed 11)  Imitrex 50 Mg Tabs (Sumatriptan Succinate) .[name] Kitchen.. 1 Tab By Mouth At Onset of Headache. Can Repeat in 2 Hours If Headache Persists.  Max 200mg  (4 Tabs Per Day) 12)  Amlodipine Besylate 5 Mg Tabs (Amlodipine Besylate) .... Take One Tablet By Mouth Daily 13)  Albuterol Sulfate (2.5 Mg/3ml) 0.083% Nebu (Albuterol  Sulfate) .... 2.5mg -5mg  Inhaled By Nebulizer Every 4 Hours As Needed Shortness of Breath.  Dispense 1. 14)  Nebulizer/adult Mask  Kit (Respiratory Therapy Supplies) .... Use As Directed With [employer].  Dispense 1 15)  Ibuprofen 600 Mg Tabs (Ibuprofen) .[name] Kitchen.. 1 Tab By Mouth Three Times A Day As Needed Pain 16)  Salicylic Acid 6 % Crea (Salicylic Acid) .... Apply To Affected Areas At Bedtime. Rinse Off in Morning.  Dispense 30 Grams. 17)  Zofran 4 Mg Tabs (Ondansetron Hcl) .[name] Kitchen.. 1-2 Tabs Every 4-6 Hrs As Needed Nausea 18)  Docusate Sodium 100 Mg Caps (Docusate Sodium) .[name] Kitchen.. 1 Cap Two Times A Day As Needed Constipation 19)  Fleet Bisacodyl 10 Mg/55ml Enem (Bisacodyl) .[name] Kitchen.. 1 Enema Rectally Once Per Day Until Large Bowel Movement. 20)  Magnesium Citrate 1.745 Gm/100ml Soln (Magnesium Citrate) .[name] KitchenMarland KitchenMarland Kitchen 150-359ml By Mouth Daily As Needed Constipation  Allergies (verified): No Known Drug Allergies  Review of Systems General:  Denies fatigue and fever. GI:  Complains of constipation and nausea; denies abdominal pain, bloody stools, and vomiting.  Physical Exam  General:  Well-developed,well-nourished,in no acute distress; alert,appropriate and cooperative throughout examination Abdomen:  Bowel sounds positive,abdomen soft and non-tender without masses, organomegaly or hernias noted.   Impression & Recommendations:  Problem # 1:  CONSTIPATION (ICD-564.00) Assessment Improved Nausea likley from constipation.  Pt has not followed instructions recommended by Dr Earnest Bailey: miralax three times a day and enema daily.  Advised she does this unclear she has normal soft daily stools, then go back to miralax once daily.  She agreed.   Her updated medication list for this problem includes:    Miralax Powd (Polyethylene glycol 3350) .[name] Kitchen... As needed    Docusate Sodium 100 Mg Caps (Docusate sodium) .[name] Kitchen... 1 cap two times a day as needed constipation    Fleet Bisacodyl 10 Mg/50ml Enem (Bisacodyl) .[name] Kitchen... 1 enema rectally  once per day until large bowel movement.    Magnesium Citrate 1.745 Gm/31ml Soln (Magnesium citrate) .[name] KitchenMarland KitchenMarland KitchenMarland Kitchen 150-367ml by mouth daily as needed constipation  Orders: [facility]- Est Level  3 (16109)  Complete Medication List: 1)  Baclofen 10 Mg Tabs (Baclofen) .... Take 1 tablet by mouth four times a day 2)  Toprol Xl 100 Mg Tb24 (Metoprolol succinate) .... Take 1 tablet by mouth once a day 3)  Seroquel 200 Mg Tabs (quetiapine Fumarate)  .... One tab by mouth qhs 4)  Depo-provera 150 Mg/ml Im Susp (Medroxyprogesterone acetate) .... Q31m 5)  Lamictal 150 Mg Tabs (Lamotrigine) .... Per dr Sharene Skeans 6)  Klonopin 1 Mg Tabs (Clonazepam) .... Per dr hickling 7)  Zanaflex 2 Mg Caps (Tizanidine hcl) .... Per dr Sharene Skeans 8)  Oxybutynin Chloride 5 Mg Tabs (Oxybutynin chloride) .... One tab by mouth three times a day for overactive bladder 9)  Aerochamber Mv Misc (Spacer/aero-holding chambers) .... To use with mdi 10)  Miralax Powd (Polyethylene glycol 3350) .... As needed 11)  Imitrex 50 Mg Tabs (Sumatriptan succinate) .[name] Kitchen.. 1 tab by mouth at onset of headache. can repeat in 2 hours if headache persists.  max 200mg  (4 tabs per day) 12)  Amlodipine Besylate 5 Mg Tabs (Amlodipine besylate) .... Take one tablet by mouth daily 13)  Albuterol Sulfate (2.5 Mg/62ml) 0.083% Nebu (Albuterol sulfate) .... 2.5mg -5mg  inhaled by nebulizer every 4 hours as needed shortness of breath.  dispense 1. 14)  Nebulizer/adult Mask Kit (Respiratory therapy supplies) .... Use as directed with neb machine.  dispense 1 15)  Ibuprofen 600 Mg Tabs (Ibuprofen) .[name] Kitchen.. 1 tab by mouth three times a day as needed pain 16)  Salicylic Acid 6 % Crea (Salicylic acid) .... Apply to affected areas at bedtime. rinse off in morning.  dispense 30 grams. 17)  Zofran 4 Mg Tabs (Ondansetron hcl) .[name] Kitchen.. 1-2 tabs every 4-6 hrs as needed nausea 18)  Docusate Sodium 100 Mg Caps (Docusate sodium) .[name] Kitchen.. 1 cap two times a day as needed constipation 19)  Fleet Bisacodyl  10 Mg/34ml Enem (Bisacodyl) .[name] Kitchen.. 1 enema rectally once per day until large bowel movement. 20)  Magnesium Citrate 1.745 Gm/38ml Soln (Magnesium citrate) .[name] KitchenMarland KitchenMarland Kitchen 150-367ml by mouth daily as needed constipation  Patient Instructions: 1)  Please schedule a follow-up appointment in 1 week with Dr Janalyn Harder. 2)  For your nausea, it is likely due to constipation. 3)  Use enema once a day and Miralax three times a day until you have soft stools on daily basis. 4)  Drinks lots of fluids.    Orders Added: 1)  [facility]- Est Level  3 [60454]

## 2011-01-03 NOTE — Progress Notes (Signed)
  her brother called to ask if we knew the name of the md who will be seeing her at [facility]. I gave him the name. she has a preop appt in 10 minutes. BUT, she just was discharged from [facility] 20 minutes ago. unable to make it to the appt. he will call them & reschedule. pt had been in [facility] for surgery on her "pump".Golden Circle RN  May 22, 2010 12:13 PM

## 2011-01-03 NOTE — Assessment & Plan Note (Signed)
Summary: still feels bad,df   Vital Signs:  Patient profile:   35 year old female Weight:      148 pounds Temp:     98 degrees F oral Pulse rate:   102 / minute Pulse rhythm:   regular BP sitting:   140 / 88  (left arm) Cuff size:   regular  Vitals Entered By: Loralee Pacas CMA (September 17, 2010 2:50 PM) Comments still not feeling any better, nausea meds not helping   Primary Care Provider:  CAT TA MD   History of Present Illness: 60 yo with CP here for continued nausea x 2 weeks  - Had nausea at the end of septemeber when she was terated as an inpatient for status migrainosus - was seen in office last week for nausea, given bowel regimen and zofran, had several good BM's, continued nausea - zofran only helps for 1-2 hous - no abd pain, fever, emesis -  no UTI symptoms- neurogenic bladder - hx of decubitus per ED note -  takin liquids ok but poor appetite due to nausea - no new medicines  Habits & Providers  Alcohol-Tobacco-Diet     Tobacco Status: never  Current Medications (verified): 1)  Baclofen 10 Mg Tabs (Baclofen) .... Take 1 Tablet By Mouth Four Times A Day 2)  Toprol Xl 100 Mg Tb24 (Metoprolol Succinate) .... Take 1 Tablet By Mouth Once A Day 3)  Seroquel 200 Mg  Tabs (Quetiapine Fumarate) .... One Tab By Mouth Qhs 4)  Effexor Xr 150 Mg Cp24 (Venlafaxine Hcl) .... One Tab By Mouth Qday Per Psych 5)  Depo-Provera 150 Mg/ml Im Susp (Medroxyprogesterone Acetate) .... Q91m 6)  Lamictal 150 Mg Tabs (Lamotrigine) .... Per Dr Sharene Skeans 7)  Klonopin 1 Mg Tabs (Clonazepam) .... Per Dr Sharene Skeans 8)  Zanaflex 2 Mg Caps (Tizanidine Hcl) .... Per Dr Sharene Skeans 9)  Oxybutynin Chloride 5 Mg Tabs (Oxybutynin Chloride) .... One Tab By Mouth Three Times A Day For Overactive Bladder 10)  Aerochamber Mv  Misc (Spacer/aero-Holding Chambers) .... To Use With Mdi 11)  Fluticasone Propionate 0.05 % Crea (Fluticasone Propionate) .... As Needed 12)  Restoril 22.5 Mg Caps (Temazepam) ....  At Bedtime 13)  Miralax  Powd (Polyethylene Glycol 3350) .... As Needed 14)  Imitrex 50 Mg Tabs (Sumatriptan Succinate) .[name] Kitchen.. 1 Tab By Mouth At Onset of Headache. Can Repeat in 2 Hours If Headache Persists.  Max 200mg  (4 Tabs Per Day) 15)  Amlodipine Besylate 5 Mg Tabs (Amlodipine Besylate) .... Take One Tablet By Mouth Daily 16)  Albuterol Sulfate (2.5 Mg/35ml) 0.083% Nebu (Albuterol Sulfate) .... 2.5mg -5mg  Inhaled By Nebulizer Every 4 Hours As Needed Shortness of Breath.  Dispense 1. 17)  Nebulizer/adult Mask  Kit (Respiratory Therapy Supplies) .... Use As Directed With [employer].  Dispense 1 18)  Bacitracin 500 Unit/gm Oint (Bacitracin) .... Apply To Wounds Two Times A Day As Needed To Prevent Infection.  Large Op 19)  Vicodin Hp 10-660 Mg Tabs (Hydrocodone-Acetaminophen) .... Take 1 Tab Daily As Needed Severe Pain 20)  Ibuprofen 600 Mg Tabs (Ibuprofen) .[name] Kitchen.. 1 Tab By Mouth Three Times A Day As Needed Pain 21)  Salicylic Acid 6 % Crea (Salicylic Acid) .... Apply To Affected Areas At Bedtime. Rinse Off in Morning.  Dispense 30 Grams. 22)  Zyrtec Allergy 10 Mg Tabs (Cetirizine Hcl) .[name] Kitchen.. 1 Tab By Mouth Daily For Itchiness 23)  Zofran 4 Mg Tabs (Ondansetron Hcl) .[name] Kitchen.. 1-2 Tabs Every 4-6 Hrs As Needed Nausea 24)  Docusate  Sodium 100 Mg Caps (Docusate Sodium) .[name] Kitchen.. 1 Cap Two Times A Day As Needed Constipation 25)  Fleet Bisacodyl 10 Mg/63ml Enem (Bisacodyl) .[name] Kitchen.. 1 Enema Rectally Once Per Day Until Large Bowel Movement. 26)  Magnesium Citrate 1.745 Gm/13ml Soln (Magnesium Citrate) .[name] KitchenMarland KitchenMarland Kitchen 150-39ml By Mouth Daily As Needed Constipation  Allergies: No Known Drug Allergies PMH-FH-SH reviewed for relevance  Review of Systems      See HPI  Physical Exam  General:  in wheelchair.  alert and well-hydrated.  tired appearing. vitals noted. slighlty tachycardic Lungs:  Normal respiratory effort, chest expands symmetrically. Lungs are clear to auscultation, no crackles or wheezes. Heart:  normal rate, regular  rhythm, no murmur, and no gallop.   Abdomen:  Bowel sounds positive,abdomen soft and non-tender without masses, organomegaly or hernias noted. Skin:  checked area of right back of thigh, no ulcer- small healed scab. no surrounding cellulitis   Impression & Recommendations:  Problem # 1:  NAUSEA (ICD-787.02) unclear etiology.  Will check urinalysis and obtain culture, check XR for stool burden, and CBC and CMET for signs of infections of hepatolbiliary disease.  Will refill zofran.   if workup negative, willa ttempt trial of PPI.   Her updated medication list for this problem includes:    Zyrtec Allergy 10 Mg Tabs (Cetirizine hcl) .[name] Kitchen... 1 tab by mouth daily for itchiness    Zofran 4 Mg Tabs (Ondansetron hcl) .[name] Kitchen... 1-2 tabs every 4-6 hrs as needed nausea  Orders: Comp Met-[facility] (81191-47829) CBC w/Diff-[facility] (56213) Urinalysis-[facility] (00000) Urine Culture-[facility] (08657-84696) Diagnostic X-Ray/Fluoroscopy (Diagnostic X-Ray/Flu) [facility]- Est Level  3 (29528)  Complete Medication List: 1)  Baclofen 10 Mg Tabs (Baclofen) .... Take 1 tablet by mouth four times a day 2)  Toprol Xl 100 Mg Tb24 (Metoprolol succinate) .... Take 1 tablet by mouth once a day 3)  Seroquel 200 Mg Tabs (quetiapine Fumarate)  .... One tab by mouth qhs 4)  Effexor Xr 150 Mg Cp24 (Venlafaxine hcl) .... One tab by mouth qday per psych 5)  Depo-provera 150 Mg/ml Im Susp (Medroxyprogesterone acetate) .... Q3m 6)  Lamictal 150 Mg Tabs (Lamotrigine) .... Per dr Sharene Skeans 7)  Klonopin 1 Mg Tabs (Clonazepam) .... Per dr hickling 8)  Zanaflex 2 Mg Caps (Tizanidine hcl) .... Per dr Sharene Skeans 9)  Oxybutynin Chloride 5 Mg Tabs (Oxybutynin chloride) .... One tab by mouth three times a day for overactive bladder 10)  Aerochamber Mv Misc (Spacer/aero-holding chambers) .... To use with mdi 11)  Fluticasone Propionate 0.05 % Crea (Fluticasone propionate) .... As needed 12)  Restoril 22.5 Mg Caps (Temazepam) .... At bedtime 13)  Miralax Powd  (Polyethylene glycol 3350) .... As needed 14)  Imitrex 50 Mg Tabs (Sumatriptan succinate) .[name] Kitchen.. 1 tab by mouth at onset of headache. can repeat in 2 hours if headache persists.  max 200mg  (4 tabs per day) 15)  Amlodipine Besylate 5 Mg Tabs (Amlodipine besylate) .... Take one tablet by mouth daily 16)  Albuterol Sulfate (2.5 Mg/31ml) 0.083% Nebu (Albuterol sulfate) .... 2.5mg -5mg  inhaled by nebulizer every 4 hours as needed shortness of breath.  dispense 1. 17)  Nebulizer/adult Mask Kit (Respiratory therapy supplies) .... Use as directed with neb machine.  dispense 1 18)  Bacitracin 500 Unit/gm Oint (Bacitracin) .... Apply to wounds two times a day as needed to prevent infection.  large op 19)  Vicodin Hp 10-660 Mg Tabs (Hydrocodone-acetaminophen) .... Take 1 tab daily as needed severe pain 20)  Ibuprofen 600 Mg Tabs (Ibuprofen) .[name] Kitchen.. 1 tab by mouth  three times a day as needed pain 21)  Salicylic Acid 6 % Crea (Salicylic acid) .... Apply to affected areas at bedtime. rinse off in morning.  dispense 30 grams. 22)  Zyrtec Allergy 10 Mg Tabs (Cetirizine hcl) .[name] Kitchen.. 1 tab by mouth daily for itchiness 23)  Zofran 4 Mg Tabs (Ondansetron hcl) .[name] Kitchen.. 1-2 tabs every 4-6 hrs as needed nausea 24)  Docusate Sodium 100 Mg Caps (Docusate sodium) .[name] Kitchen.. 1 cap two times a day as needed constipation 25)  Fleet Bisacodyl 10 Mg/5ml Enem (Bisacodyl) .[name] Kitchen.. 1 enema rectally once per day until large bowel movement. 26)  Magnesium Citrate 1.745 Gm/52ml Soln (Magnesium citrate) .[name] KitchenMarland KitchenMarland Kitchen 150-382ml by mouth daily as needed constipation  Patient Instructions: 1)  Will get labwork and and xray of you abdomen to look for blockages or signs of infection 2)  I will call you with results    Orders Added: 1)  Comp Met-[facility] [[id number]] 2)  CBC w/Diff-[facility] [85025] 3)  Urinalysis-[facility] [00000] 4)  Urine Culture-[facility] [[id number]] 5)  Diagnostic X-Ray/Fluoroscopy [Diagnostic X-Ray/Flu] 6)  [facility]- Est Level  3 [99213]    Laboratory Results     Urine Tests  Date/Time Received: September 17, 2010 3:37 PM  Date/Time Reported: September 17, 2010 3:54 PM   Routine Urinalysis   Color: light yellow Appearance: Clear Glucose: negative   (Normal Range: Negative) Bilirubin: negative   (Normal Range: Negative) Ketone: negative   (Normal Range: Negative) Spec. Gravity: 1.010   (Normal Range: 1.003-1.035) Blood: negative   (Normal Range: Negative) pH: 6.0   (Normal Range: 5.0-8.0) Protein: negative   (Normal Range: Negative) Urobilinogen: 0.2   (Normal Range: 0-1) Nitrite: negative   (Normal Range: Negative) Leukocyte Esterace: negative   (Normal Range: Negative)    Comments: ..urine sent for culture ...............test performed by......[name] KitchenBonnie A. [place], MLS (ASCP)cm

## 2011-01-03 NOTE — Consult Note (Signed)
Summary: [facility]: Discharge from OT  [facility]   Imported By: De Nurse 01/05/2010 17:20:50  _____________________________________________________________________  External Attachment:    Type:   Image     Comment:   External Document

## 2011-01-03 NOTE — Miscellaneous (Signed)
   Clinical Lists Changes  Medications: Removed medication of DARVOCET A500 100-500 MG TABS (PROPOXYPHENE N-APAP) one tab qid as needed

## 2011-01-03 NOTE — Miscellaneous (Signed)
Summary: wrist pain   Clinical Lists Changes c/o wrist pain. states she has been to md for this. offered appt here with pcp. she saw Dr. Jennette Kettle in sports med & wants to see her again.  I called SM. she had an appt at 11:15 today. called her back & had to leave a message. left # to [facility] & asked her to call & reschedule appt. [name] KitchenGolden Circle RN  March 02, 2010 11:48 AM

## 2011-01-03 NOTE — Miscellaneous (Signed)
Summary: RE: CHANGING OF PCP/TS  pt complained about Dr.Ta. Does not want to see her any more and is requesting to be assigned to a new PCP. I told the pt, that I was unable to do that for her, but will ask the office manager to assist her with this request. Pt agreed. fwd. to Dr.Ta and D.Juanetta Gosling, and Dr.Chambliss for review .Arlyss Repress CMA,  November 06, 2010 4:45 PM   Appended Document: RE: CHANGING OF PCP/TS  Clinical Lists Changes  Called patient at home she can't talk now but would like me to call back in a few hours Caled again - needs a few more minutes Difficult to understand over the phone she will be here tomorrow for colpo clinic.  I will talk to her then Pearlean Brownie MD  November 07, 2010 3:53 PM   Spoke with [name] [name] in person.  She was unhappy that she percieved Dr Janalyn Harder as saying that she will have to put up with her very bad pain from her cramps.  She does not want more pain medicine but felt hurt by what she feels is Dr Mack Hook not caring.    [name] [name] does not currently want to change providers.  I discussed with Dr Janalyn Harder who has been making a long term effort to decrease [name] [name] chronic narcotics which I think is appropriate.    I explained [name] [name] feelings.  In conclusion.  [name] [name] will continue to see Dr Janalyn Harder who will discuss this issue they wll hopefully develop a mutually agreeable plan  Pearlean Brownie MD  November 08, 2010 10:46 AM

## 2011-01-03 NOTE — Consult Note (Signed)
Summary: [facility]  [facility]   Imported By: Bradly Bienenstock 02/20/2010 16:38:17  _____________________________________________________________________  External Attachment:    Type:   Image     Comment:   External Document

## 2011-01-03 NOTE — Progress Notes (Signed)
Summary: triage   Phone Note Call from Patient Call back at Home Phone [phone]   Caller: Patient Summary of Call: Pt asking that Kennon Rounds call her. Said to keep trying till she answers the phone. Initial call taken by: Clydell Hakim,  March 28, 2010 11:24 AM  Follow-up for Phone Call        she wanted to change her bp check to friday. I cancelled the Thursday appt Follow-up by: Golden Circle RN,  March 28, 2010 11:41 AM

## 2011-01-03 NOTE — Miscellaneous (Signed)
Summary: CP   Clinical Lists Changes she is here for labs. c/o sudden sharp pain in epigastric & chest that started at 9:30 today. states it is like when she had pulmonary emboli. put in work in schedule.[name] KitchenMarland KitchenGolden Circle RN  December 20, 2009 11:24 AM

## 2011-01-03 NOTE — Assessment & Plan Note (Signed)
Summary: ck for dm,df   Vital Signs:  Patient profile:   35 year old female Weight:      154 pounds BMI:     31.22 Temp:     98.6 degrees F Pulse rate:   72 / minute BP sitting:   118 / 78  (left arm) Cuff size:   regular  Vitals Entered By: Dennison Nancy RN CC: To check for diabetes Is Patient Diabetic? No Pain Assessment Patient in pain? yes     Location: left wrist Intensity: 3   Primary Care Provider:  Delylah Stanczyk MD  CC:  To check for diabetes.  History of Present Illness: 35 y/o F with CP here because she wants to be tested for DM bc  Always feeling thirsty Shakes when she does not eat for 21/2 - 3 hrs. No lightheadedness.  No syncope.  No confusion.  No dyspnea.  No chest pain.    She has been dieting and so is eating less food.    Typical day of food intake Breakfast: 1 bowl of miniwheat cereal + milk. Lunch: sandwich or salad, tea or water. Salad made of lettuce cucumber tomato crutons peperoni.  Ham or balogne sandwich.  Sometimes has two cheese sticks. Dinner: starch, meat, vegetables. No snacks between meals.   Seems like that she is getting "shakes" before the next meals.    Last meal today was 12pm, 3 hrs ago  Habits & Providers  Alcohol-Tobacco-Diet     Tobacco Status: never  Current Medications (verified): 1)  Baclofen 10 Mg Tabs (Baclofen) .... Take 1 Tablet By Mouth Four Times A Day 2)  Toprol Xl 100 Mg Tb24 (Metoprolol Succinate) .... Take 1 Tablet By Mouth Once A Day 3)  Seroquel 200 Mg  Tabs (Quetiapine Fumarate) .... One Tab By Mouth Qhs 4)  Effexor Xr 150 Mg Cp24 (Venlafaxine Hcl) .... One Tab By Mouth Qday Per Psych 5)  Depo-Provera 150 Mg/ml Im Susp (Medroxyprogesterone Acetate) .... Q66m 6)  Lamictal 150 Mg Tabs (Lamotrigine) .... Per Dr Sharene Skeans 7)  Klonopin 1 Mg Tabs (Clonazepam) .... Per Dr Sharene Skeans 8)  Zanaflex 2 Mg Caps (Tizanidine Hcl) .... Per Dr Sharene Skeans 9)  Oxybutynin Chloride 5 Mg Tabs (Oxybutynin Chloride) .... One Tab By Mouth  Three Times A Day For Overactive Bladder 10)  Aerochamber Mv  Misc (Spacer/aero-Holding Chambers) .... To Use With Mdi 11)  Fluticasone Propionate 0.05 % Crea (Fluticasone Propionate) .... As Needed 12)  Restoril 22.5 Mg Caps (Temazepam) .... At Bedtime 13)  Miralax  Powd (Polyethylene Glycol 3350) .... As Needed 14)  Imitrex 50 Mg Tabs (Sumatriptan Succinate) .... As Needed 15)  Amlodipine Besylate 5 Mg Tabs (Amlodipine Besylate) .... Take One Tablet By Mouth Daily 16)  Albuterol Sulfate (2.5 Mg/34ml) 0.083% Nebu (Albuterol Sulfate) .... 2.5mg -5mg  Inhaled By Nebulizer Every 4 Hours As Needed Shortness of Breath.  Dispense 1. 17)  Nebulizer/adult Mask  Kit (Respiratory Therapy Supplies) .... Use As Directed With [employer].  Dispense 1 18)  Bacitracin 500 Unit/gm Oint (Bacitracin) .... Apply To Wounds Two Times A Day As Needed To Prevent Infection.  Large Op 19)  Vicodin Hp 10-660 Mg Tabs (Hydrocodone-Acetaminophen) .... Take 1 Tab Daily As Needed Severe Pain 20)  Ibuprofen 600 Mg Tabs (Ibuprofen) .[name] Kitchen.. 1 Tab By Mouth Three Times A Day As Needed Pain  Allergies (verified): No Known Drug Allergies  Review of Systems       per hpi   Physical Exam  General:  Well-developed,well-nourished,in  no acute distress; alert,appropriate and cooperative throughout examination. vitals reviewed.   Neurologic:  alert & oriented X3.     Impression & Recommendations:  Problem # 1:  FATIGUE (ICD-780.79) Assessment New Pt feeling shakiness and always thirty, fatigue "all the time".  Will check current CBG, A1C, TSH.  Hb was normal in 04/2010.  I do not believe symptoms are 2/2 diabetes.  Her random glucose has been elevated one time, 143 in 2008.  Since then they have been normal.  A1C was 6.0 in 2010.  May be hypoglycemia 2/2 to dieting and decreased calories?  Discussed adding healthy snack in between her main meals.  Pt to rtc in 1 mo.   Orders: A1C-[facility] (54098) TSH-[facility] (11914-78295) Glucose-[facility]  ([id number]) [facility]- Est Level  3 (46962)  Complete Medication List: 1)  Baclofen 10 Mg Tabs (Baclofen) .... Take 1 tablet by mouth four times a day 2)  Toprol Xl 100 Mg Tb24 (Metoprolol succinate) .... Take 1 tablet by mouth once a day 3)  Seroquel 200 Mg Tabs (quetiapine Fumarate)  .... One tab by mouth qhs 4)  Effexor Xr 150 Mg Cp24 (Venlafaxine hcl) .... One tab by mouth qday per psych 5)  Depo-provera 150 Mg/ml Im Susp (Medroxyprogesterone acetate) .... Q47m 6)  Lamictal 150 Mg Tabs (Lamotrigine) .... Per dr Sharene Skeans 7)  Klonopin 1 Mg Tabs (Clonazepam) .... Per dr hickling 8)  Zanaflex 2 Mg Caps (Tizanidine hcl) .... Per dr Sharene Skeans 9)  Oxybutynin Chloride 5 Mg Tabs (Oxybutynin chloride) .... One tab by mouth three times a day for overactive bladder 10)  Aerochamber Mv Misc (Spacer/aero-holding chambers) .... To use with mdi 11)  Fluticasone Propionate 0.05 % Crea (Fluticasone propionate) .... As needed 12)  Restoril 22.5 Mg Caps (Temazepam) .... At bedtime 13)  Miralax Powd (Polyethylene glycol 3350) .... As needed 14)  Imitrex 50 Mg Tabs (Sumatriptan succinate) .... As needed 15)  Amlodipine Besylate 5 Mg Tabs (Amlodipine besylate) .... Take one tablet by mouth daily 16)  Albuterol Sulfate (2.5 Mg/72ml) 0.083% Nebu (Albuterol sulfate) .... 2.5mg -5mg  inhaled by nebulizer every 4 hours as needed shortness of breath.  dispense 1. 17)  Nebulizer/adult Mask Kit (Respiratory therapy supplies) .... Use as directed with neb machine.  dispense 1 18)  Bacitracin 500 Unit/gm Oint (Bacitracin) .... Apply to wounds two times a day as needed to prevent infection.  large op 19)  Vicodin Hp 10-660 Mg Tabs (Hydrocodone-acetaminophen) .... Take 1 tab daily as needed severe pain 20)  Ibuprofen 600 Mg Tabs (Ibuprofen) .[name] Kitchen.. 1 tab by mouth three times a day as needed pain  Patient Instructions: 1)  Please schedule a follow-up appointment in 1 month for shakiness.  2)  Try to eat the way we discussed in the  clinic today.   Prescriptions: VICODIN HP 10-660 MG TABS (HYDROCODONE-ACETAMINOPHEN) take 1 tab daily as needed severe pain  #30 x 3   Entered and Authorized by:   Angeline Slim MD   Signed by:   Angeline Slim MD on 07/25/2010   Method used:   Telephoned to ...       [facility]* (retail)       [address]       [place], [place]  [id number]       Ph: [phone]       Fax: [phone]   RxID:   [id number] IBUPROFEN 600 MG TABS (IBUPROFEN) 1 tab by mouth three times a day as needed pain  #90 x 3  Entered and Authorized by:   Angeline Slim MD   Signed by:   Angeline Slim MD on 07/25/2010   Method used:   Electronically to        [facility]* (retail)       [address]       [place], [place]  [id number]       Ph: [phone]       Fax: [phone]   RxID:   [id number]   Appended Document: A1c  5.7 %     Lab Visit  Laboratory Results   Blood Tests   Date/Time Received: July 25, 2010 2:59 PM  Date/Time Reported: July 25, 2010 4:40 PM   HGBA1C: 5.7%   (Normal Range: Non-Diabetic - 3-6%   Control Diabetic - 6-8%)  Comments: ...............test performed by......[name] KitchenBonnie A. [place], MLS (ASCP)cm    Orders Today:

## 2011-01-03 NOTE — Progress Notes (Signed)
Summary: call from ED physician about pt and possible hypoxia.   Phone Note Other Incoming   Caller: ED physician Summary of Call: Ed physician called due to pt coming in due to fall from wheel chair that caused laceration of the head.  Pt seemed to be fine but was found to be hypoxic at 92%, no increase work of breathing no signs of infection.  Pt does have hx of PE and recently been taken off warfarin.  Pt was just scanned last week and it was normal.  Pt has been seen twice in the clinic for SOB and dyspnea, first visit pt was at 90% and improved to 94% during the course of the visit.  No other visits in centricity stated a pulse ox to compare.  Pt is supposed to have home 02 she uses as needed for SOB but pt is not using it.  Pt labs look good per MD physician.  Told ED physician to use clinical judgmement told him of the 90% in clinic that improved. Ed physician now states she is 94-97% on RA.  Told ED physician if concern will admit otherwise pt may follow up as working appointment tomorrw at our clinic, we open at 830am.  ED physician will talk to pt.  Initial call taken by: Antoine Primas DO,  Apr 26, 2010 8:11 PM     Appended Document: call from ED physician about pt and possible hypoxia. wanted a f/u appt. taking paion meds for the head pain. unable to get a ride until the afternoon. will see Dr. Claudius Sis

## 2011-01-03 NOTE — Progress Notes (Signed)
Summary: Triage   Phone Note Call from Patient Call back at Home Phone [phone]   Reason for Call: Talk to Nurse Summary of Call: pt having pain, wants to talk with RN Initial call taken by: Knox Royalty,  November 02, 2010 4:42 PM  Follow-up for Phone Call        spoke with patient and she states she is having bad cramps. advised her that if the pain med  she has on hand is not hepling she needs to go to urgent care.  Follow-up by: Theresia Lo RN,  November 02, 2010 5:07 PM

## 2011-01-03 NOTE — Assessment & Plan Note (Signed)
Summary: F/U L WRIST,[facility]   Vital Signs:  Patient profile:   35 year old female BP sitting:   160 / 102  Vitals Entered By: Lillia Pauls CMA (March 20, 2010 10:04 AM)  Referring Provider:  Sharene Skeans Primary Provider:  CAT TA MD   History of Present Illness: Pt presents for follow-up of left wrist pain that she had injected by Dr. Jennette Kettle at her last visit back in February. The injection did help her with her pain and she wants another one today. She has a history of cerebral palsy and has significant spasticity. She can only use her left hand for most of her tasks and has had fairly significant constant pain along the dorsal surface of her left wrist. She denies any numbness or tingling. She takes multiple medications including hydrocodone as needed for severe pain symptoms. She has not had previous x-rays done of her left hand but there is a concern that she might have significant degenerative disease of her wrist because of her constant use of it.   Allergies: No Known Drug Allergies  Physical Exam  General:  alert.  In a motorized wheelchair. Spasticity of her bilateral upper extremities. Eyes:  Wears glasses Neck:  supple.   Lungs:  normal respiratory effort.   Msk:  Left Wrist: Mild edema of dorsal aspect of her distal radius near the ulnar-radius joint Decreased ROM due to spasticity. Wrist is held in some extension but she can flex, deviate radially and towards her ulnar side and extend slightly further + TTP over the aread of swelling as previously described 4/5 grip strength Able to wiggle all of her fingers  Right Wrist: Flexion contracture Difficulty with ROM due to contracture Can wiggle all fingers but difficulty extending her wrist 4/5 grip strength No signs of swelling or bruising  Additional Exam:  U/S of her distal left wrist shows some edema at the distal radius and ulnar. No obvious inflammed tendons or tears. No obvious fractures. Images saved for  documentation.   Impression & Recommendations:  Problem # 1:  WRIST PAIN, LEFT (ICD-719.43) 1. Given another steroid injection today but told that she will have to do these every 8-12 weeks unless her x-rays shows a concerning finding  Consent obtained and verified. Sterile betadine prep. Furthur cleansed with alcohol. Topical analgesic spray: Ethyl chloride. Joint: Left radiual-ulnar joing Approached in typical fashion with: cleansed with betadine Completed without difficulty Meds:10mg  kenalog, 1 cc of 1% lidocaine Needle: TB needle Aftercare instructions and Red flags advised.  2. Send for x-rays of her left wrist to assess for degenerative disease 3. Return as needed 8-12 weeks   Orders: Joint Aspirate / Injection, Small (04540) Kenalog 10 mg inj (J8119) Radiology other (Radiology Other)  Complete Medication List: 1)  Baclofen 10 Mg Tabs (Baclofen) .... Take 1 tablet by mouth four times a day 2)  Toprol Xl 100 Mg Tb24 (Metoprolol succinate) .... Take 1 tablet by mouth once a day 3)  Seroquel 200 Mg Tabs (quetiapine Fumarate)  .... One tab by mouth qhs 4)  Effexor Xr 150 Mg Cp24 (Venlafaxine hcl) .... One tab by mouth qday per psych 5)  Depo-provera 150 Mg/ml Im Susp (Medroxyprogesterone acetate) .... Q48m 6)  Hydrocodone-acetaminophen 5-325 Mg Tabs (Hydrocodone-acetaminophen) .... One tab by mouth q 4 hrs as needed pan 7)  Lamictal 150 Mg Tabs (Lamotrigine) .... Per dr Sharene Skeans 8)  Klonopin 1 Mg Tabs (Clonazepam) .... Per dr Sharene Skeans 9)  Zanaflex 2 Mg Caps (Tizanidine hcl) .[name] KitchenMarland KitchenMarland Kitchen  Per dr Sharene Skeans 10)  Coumadin 5 Mg Tabs (Warfarin sodium) .... Take as directed 11)  Oxybutynin Chloride 5 Mg Tabs (Oxybutynin chloride) .... One tab by mouth three times a day for overactive bladder 12)  Aerochamber Mv Misc (Spacer/aero-holding chambers) .... To use with mdi 13)  Ibuprofen 600 Mg Tabs (Ibuprofen) .[name] Kitchen.. 1 tab by mouth two times a day as needed pain 14)  Fluticasone Propionate 0.05 %  Crea (Fluticasone propionate) .... As needed 15)  Restoril 22.5 Mg Caps (Temazepam) .... At bedtime 16)  Miralax Powd (Polyethylene glycol 3350) .... As needed 17)  Imitrex 50 Mg Tabs (Sumatriptan succinate) .... As needed 18)  Amlodipine Besylate 5 Mg Tabs (Amlodipine besylate) .... Take one tablet by mouth daily

## 2011-01-03 NOTE — Progress Notes (Signed)
   Phone Note Outgoing Call   Summary of Call: spoke with patient about xray showing stool burden.  Patient states last ngith she had a large bowel movement but still feels nauseous.  She is only using miralax once daily.  Advised an enema x 1 and miralax three times a day until stools clear.  Patient to follow-up with Dr. Janalyn Harder in 3 days.  I have flagged admind to make appt for 11:15 as Dr. Janalyn Harder has an openign then- Of note- patient will be here with her daughter who has an appointment at 10:15 and would prefer to be seen at 11 if possible. Initial call taken by: Delbert Harness MD,  September 18, 2010 3:05 PM

## 2011-01-03 NOTE — Miscellaneous (Signed)
Summary: went to ED last week   Clinical Lists Changes states the ED said she does not have a clot. sent home with pain meds. cannot come before Thursday. appt made in am w/Dr. Sharen Hones. pcp not available when pt can come in.Told her to call back if the pain & SOB returns. if we are closed go to ED. she agreed.Golden Circle RN  Apr 23, 2010 9:24 AM

## 2011-01-03 NOTE — Assessment & Plan Note (Signed)
Summary: Issues with cycle/ta/kf   Vital Signs:  Patient profile:   35 year old female Weight:      151 pounds Temp:     98.0 degrees F BP sitting:   129 / 91  (left arm)  Vitals Entered By: Starleen Blue RN (October 03, 2010 4:50 PM) CC: abd pain   Primary Care Provider:  CAT TA MD  CC:  abd pain.  History of Present Illness: Ms [name] [name] presents to the clinic with abdominal pain and vaginal bleeding x a few days. Has been amenoheic for 10 months, however she had been on Depo provea during this time period. Her vaginal bleeding is normal for her descrbing a small amount of blood when she goes to the bathroom.  She denies any fever, chills. She is having normal bowel movements for her (formed stool every other day).  The symptom that most bothers her is her crampy abdominal pain that is not relieved with OTC meds. She requests Rx pain medication. Tramadol makes her feel groggy or funny. Darvocet or vicodin seem to work better with less side effects.  Habits & Providers  Alcohol-Tobacco-Diet     Tobacco Status: never  Current Problems (verified): 1)  Menorrhagia  (ICD-626.2) 2)  Constipation  (ICD-564.00) 3)  Viral Uri  (ICD-465.9) 4)  Nausea  (ICD-787.02) 5)  Skin Rash  (ICD-782.1) 6)  Headache  (ICD-784.0) 7)  Fatigue  (ICD-780.79) 8)  Wrist Pain, Left  (ICD-719.43) 9)  Hypertension, Benign  (ICD-401.1) 10)  Syncope and Collapse  (ICD-780.2) 11)  Chest Pain-unspecified  (ICD-786.50) 12)  Tachycardia  (ICD-785) 13)  Cerebral Palsy  (ICD-343.9) 14)  Contracture of Joint of Multiple Sites  (ICD-718.49) 15)  Pulmonary Embolism, Hx of  (ICD-V12.51) 16)  Endometriosis  (ICD-617.9) 17)  Migraine Headache  (ICD-346.90) 18)  Cystitis, Recurrent  (ICD-595.9) 19)  Obsessive Compul. Disorder  (ICD-300.3) 20)  Depression, Major, Recurrent  (ICD-296.30) 21)  Pressure Ulcer Stage II  (ICD-707.22) 22)  Screening For Malignant Neoplasm of The Cervix  (ICD-V76.2)  Medications  Prior to Update: 1)  Baclofen 10 Mg Tabs (Baclofen) .... Take 1 Tablet By Mouth Four Times A Day 2)  Toprol Xl 100 Mg Tb24 (Metoprolol Succinate) .... Take 1 Tablet By Mouth Once A Day 3)  Seroquel 200 Mg  Tabs (Quetiapine Fumarate) .... One Tab By Mouth Qhs 4)  Depo-Provera 150 Mg/ml Im Susp (Medroxyprogesterone Acetate) .... Q77m 5)  Lamictal 150 Mg Tabs (Lamotrigine) .... Per Dr Sharene Skeans 6)  Klonopin 1 Mg Tabs (Clonazepam) .... Per Dr Sharene Skeans 7)  Zanaflex 2 Mg Caps (Tizanidine Hcl) .... Per Dr Sharene Skeans 8)  Oxybutynin Chloride 5 Mg Tabs (Oxybutynin Chloride) .... One Tab By Mouth Three Times A Day For Overactive Bladder 9)  Aerochamber Mv  Misc (Spacer/aero-Holding Chambers) .... To Use With Mdi 10)  Miralax  Powd (Polyethylene Glycol 3350) .... As Needed 11)  Imitrex 50 Mg Tabs (Sumatriptan Succinate) .[name] Kitchen.. 1 Tab By Mouth At Onset of Headache. Can Repeat in 2 Hours If Headache Persists.  Max 200mg  (4 Tabs Per Day) 12)  Amlodipine Besylate 5 Mg Tabs (Amlodipine Besylate) .... Take One Tablet By Mouth Daily 13)  Albuterol Sulfate (2.5 Mg/79ml) 0.083% Nebu (Albuterol Sulfate) .... 2.5mg -5mg  Inhaled By Nebulizer Every 4 Hours As Needed Shortness of Breath.  Dispense 1. 14)  Nebulizer/adult Mask  Kit (Respiratory Therapy Supplies) .... Use As Directed With [employer].  Dispense 1 15)  Ibuprofen 600 Mg Tabs (Ibuprofen) .[name] Kitchen.. 1 Tab By  Mouth Three Times A Day As Needed Pain 16)  Salicylic Acid 6 % Crea (Salicylic Acid) .... Apply To Affected Areas At Bedtime. Rinse Off in Morning.  Dispense 30 Grams. 17)  Zofran 4 Mg Tabs (Ondansetron Hcl) .[name] Kitchen.. 1-2 Tabs Every 4-6 Hrs As Needed Nausea 18)  Docusate Sodium 100 Mg Caps (Docusate Sodium) .[name] Kitchen.. 1 Cap Two Times A Day As Needed Constipation 19)  Fleet Bisacodyl 10 Mg/42ml Enem (Bisacodyl) .[name] Kitchen.. 1 Enema Rectally Once Per Day Until Large Bowel Movement. 20)  Magnesium Citrate 1.745 Gm/27ml Soln (Magnesium Citrate) .[name] KitchenMarland KitchenMarland Kitchen 150-337ml By Mouth Daily As Needed  Constipation  Current Medications (verified): 1)  Baclofen 10 Mg Tabs (Baclofen) .... Take 1 Tablet By Mouth Four Times A Day 2)  Toprol Xl 100 Mg Tb24 (Metoprolol Succinate) .... Take 1 Tablet By Mouth Once A Day 3)  Seroquel 200 Mg  Tabs (Quetiapine Fumarate) .... One Tab By Mouth Qhs 4)  Depo-Provera 150 Mg/ml Im Susp (Medroxyprogesterone Acetate) .... Q63m 5)  Lamictal 150 Mg Tabs (Lamotrigine) .... Per Dr Sharene Skeans 6)  Klonopin 1 Mg Tabs (Clonazepam) .... Per Dr Sharene Skeans 7)  Zanaflex 2 Mg Caps (Tizanidine Hcl) .... Per Dr Sharene Skeans 8)  Oxybutynin Chloride 5 Mg Tabs (Oxybutynin Chloride) .... One Tab By Mouth Three Times A Day For Overactive Bladder 9)  Aerochamber Mv  Misc (Spacer/aero-Holding Chambers) .... To Use With Mdi 10)  Miralax  Powd (Polyethylene Glycol 3350) .... As Needed 11)  Imitrex 50 Mg Tabs (Sumatriptan Succinate) .[name] Kitchen.. 1 Tab By Mouth At Onset of Headache. Can Repeat in 2 Hours If Headache Persists.  Max 200mg  (4 Tabs Per Day) 12)  Amlodipine Besylate 5 Mg Tabs (Amlodipine Besylate) .... Take One Tablet By Mouth Daily 13)  Albuterol Sulfate (2.5 Mg/17ml) 0.083% Nebu (Albuterol Sulfate) .... 2.5mg -5mg  Inhaled By Nebulizer Every 4 Hours As Needed Shortness of Breath.  Dispense 1. 14)  Nebulizer/adult Mask  Kit (Respiratory Therapy Supplies) .... Use As Directed With [employer].  Dispense 1 15)  Ibuprofen 600 Mg Tabs (Ibuprofen) .[name] Kitchen.. 1 Tab By Mouth Three Times A Day As Needed Pain 16)  Salicylic Acid 6 % Crea (Salicylic Acid) .... Apply To Affected Areas At Bedtime. Rinse Off in Morning.  Dispense 30 Grams. 17)  Zofran 4 Mg Tabs (Ondansetron Hcl) .[name] Kitchen.. 1-2 Tabs Every 4-6 Hrs As Needed Nausea 18)  Docusate Sodium 100 Mg Caps (Docusate Sodium) .[name] Kitchen.. 1 Cap Two Times A Day As Needed Constipation 19)  Fleet Bisacodyl 10 Mg/3ml Enem (Bisacodyl) .[name] Kitchen.. 1 Enema Rectally Once Per Day Until Large Bowel Movement. 20)  Magnesium Citrate 1.745 Gm/49ml Soln (Magnesium Citrate) .[name] KitchenMarland KitchenMarland Kitchen 150-34ml By  Mouth Daily As Needed Constipation 21)  Hydrocodone-Acetaminophen 5-500 Mg Tabs (Hydrocodone-Acetaminophen) .[name] Kitchen.. 1 By Mouth Q6 Hrs As Needed Pain  Allergies (verified): No Known Drug Allergies  Past History:  Past Medical History: Last updated: 06/21/2010 1. migraines?--tx`d successfully w/ Imitrex (3/06) and prophylaxic by neuro 2. 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 DJD 4. Dysarthria with mild dysphagia 5. recurren UTI's  6. Endometriosis 7. GERD 8. H/o gestational DM w/ G2 9. H/o neurogenic bladder 10. 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 Right shin MRSA abcess requiring I&D (3/06) 12. PE-09/2009-anticoagulated for 7 months with Coumadin (d/c 04/2010) 13. sinus tachycardia 14. Altered mental status 2/2 hypoventilation from anxiety (01/2010, [facility])  Past Surgical History: Last updated: 06/21/2010 -Baclofen pump insertion  8/00 -Colposcopy--7/01--LGSIL - 07/08/2000 -C-section x 2:  1997 & 2003 -BTL 2003 -Gallbladder U/S--normal  1/98 -,  -  I&D of right LE MRSA abscess - 02/27/2005,  -Laproscopic excision of endometriosis and laproscopic uterosacral nerve ablation (LUNA) procedure 10/2001 -Left Carpal Tunnel Ligament Release 08/2008 by Dr Teressa Senter -Oral surgery for 10 teeth extraction 07/18/09 -EEG 01/2010 by Dr Sharene Skeans: normal, no seizure activities, 6 events that occurred were preceded by hypoventitation  -L wrist surgery by Dr Ileene Rubens, 06/14/10  Family History: Last updated: 01/29/2007 No DM, CAD, HTN.  One GM d/o colon CA, the other of breast CA.  Social History: Last updated: 05/14/2010 [name] [name], who has schizophrenia, MR, is father of daughter.  [name] [name] and pt are no longer together.  [name] [name] does not see [name] [name]. Daughter is 16 yo, Stage manager, lives with pt.  24, 2 yo son, lives with pt's parents. Needs assistance with ADL's and IADL's--has personal care services 20 hrs/wk; -Because of [name] [name]'s worsening condition in fall 2005, her son, [name] [name], born  in 1997, now lives w/ [name] [name]'s father  Medical sales representative wheelchair.; -No tobacco, EtOH, drugs.; -Patient very concerned about her kids being taken from her; -in aug 06 Janet began taking care of 35 year old [name] [name] again  Risk Factors: Smoking Status: never (10/03/2010)  Review of Systems       The patient complains of abdominal pain.  The patient denies anorexia, fever, weight loss, chest pain, syncope, melena, and severe indigestion/heartburn.    Physical Exam  General:  Vs noted.  Wheel chair bound woman in NAD Lungs:  Normal respiratory effort, chest expands symmetrically. Lungs are clear to auscultation, no crackles or wheezes. Heart:  normal rate, regular rhythm, no murmur, and no gallop.   Abdomen:  NABS,  Full with firm stool noted throughout. However not tender, no guarding and no rebound noted.   Impression & Recommendations:  Problem # 1:  MENORRHAGIA (ICD-626.2) Assessment New This this is normal period with withdrawl of depo provera.  Pain is main complaint. Not controlled with ibuprofen.  No signs or severe abdominal process on exam. No red flags.  Plan to provide vicodin #30 and have patient follow up with Dr. Janalyn Harder in 1 week. If still bleeding and having pain then will consider by mouth progesterones to cause bleeding to stop. Additonally may also consider ultrasound or xray.  Discussed red flags pt expresses understanding.   Her updated medication list for this problem includes:    Depo-provera 150 Mg/ml Im Susp (Medroxyprogesterone acetate) ..... Q28m  Orders: Urinalysis-[facility] (00000) U Preg-[facility] (81025) [facility]- Est Level  3 (99213)  Problem # 2:  CONSTIPATION (ICD-564.00) Assessment: Unchanged Not changed. Think constipation is at baseline and not contributory to this pain. Encouraged frequent use of the below medications with the onset of vicodin.   Her updated medication list for this problem includes:    Miralax Powd (Polyethylene glycol 3350) .[name] Kitchen... As needed     Docusate Sodium 100 Mg Caps (Docusate sodium) .[name] Kitchen... 1 cap two times a day as needed constipation    Fleet Bisacodyl 10 Mg/32ml Enem (Bisacodyl) .[name] Kitchen... 1 enema rectally once per day until large bowel movement.    Magnesium Citrate 1.745 Gm/32ml Soln (Magnesium citrate) .[name] KitchenMarland KitchenMarland KitchenMarland Kitchen 150-313ml by mouth daily as needed constipation  Complete Medication List: 1)  Baclofen 10 Mg Tabs (Baclofen) .... Take 1 tablet by mouth four times a day 2)  Toprol Xl 100 Mg Tb24 (Metoprolol succinate) .... Take 1 tablet by mouth once a day 3)  Seroquel 200 Mg Tabs (quetiapine Fumarate)  .... One tab by mouth qhs 4)  Depo-provera 150 Mg/ml Im  Susp (Medroxyprogesterone acetate) .... Q28m 5)  Lamictal 150 Mg Tabs (Lamotrigine) .... Per dr Sharene Skeans 6)  Klonopin 1 Mg Tabs (Clonazepam) .... Per dr hickling 7)  Zanaflex 2 Mg Caps (Tizanidine hcl) .... Per dr Sharene Skeans 8)  Oxybutynin Chloride 5 Mg Tabs (Oxybutynin chloride) .... One tab by mouth three times a day for overactive bladder 9)  Aerochamber Mv Misc (Spacer/aero-holding chambers) .... To use with mdi 10)  Miralax Powd (Polyethylene glycol 3350) .... As needed 11)  Imitrex 50 Mg Tabs (Sumatriptan succinate) .[name] Kitchen.. 1 tab by mouth at onset of headache. can repeat in 2 hours if headache persists.  max 200mg  (4 tabs per day) 12)  Amlodipine Besylate 5 Mg Tabs (Amlodipine besylate) .... Take one tablet by mouth daily 13)  Albuterol Sulfate (2.5 Mg/67ml) 0.083% Nebu (Albuterol sulfate) .... 2.5mg -5mg  inhaled by nebulizer every 4 hours as needed shortness of breath.  dispense 1. 14)  Nebulizer/adult Mask Kit (Respiratory therapy supplies) .... Use as directed with neb machine.  dispense 1 15)  Ibuprofen 600 Mg Tabs (Ibuprofen) .[name] Kitchen.. 1 tab by mouth three times a day as needed pain 16)  Salicylic Acid 6 % Crea (Salicylic acid) .... Apply to affected areas at bedtime. rinse off in morning.  dispense 30 grams. 17)  Zofran 4 Mg Tabs (Ondansetron hcl) .[name] Kitchen.. 1-2 tabs every 4-6 hrs as needed  nausea 18)  Docusate Sodium 100 Mg Caps (Docusate sodium) .[name] Kitchen.. 1 cap two times a day as needed constipation 19)  Fleet Bisacodyl 10 Mg/69ml Enem (Bisacodyl) .[name] Kitchen.. 1 enema rectally once per day until large bowel movement. 20)  Magnesium Citrate 1.745 Gm/20ml Soln (Magnesium citrate) .[name] KitchenMarland KitchenMarland Kitchen 150-359ml by mouth daily as needed constipation 21)  Hydrocodone-acetaminophen 5-500 Mg Tabs (Hydrocodone-acetaminophen) .[name] Kitchen.. 1 by mouth q6 hrs as needed pain  Patient Instructions: 1)  Thank you for seeing me today. 2)  Please schedule an appointment with Dr. Janalyn Harder in 1 week. 3)  If you have chest pain, difficulty breathing, fevers over 102 that does not get better with tylenol please call us or see a doctor.  4)  let us know if the bleeding gets worse.  5)  Take the pain medicine.  6)  Keep up with the constipation medication.  Prescriptions: HYDROCODONE-ACETAMINOPHEN 5-500 MG TABS (HYDROCODONE-ACETAMINOPHEN) 1 by mouth q6 hrs as needed pain  #30 x 0   Entered and Authorized by:   Clementeen Graham MD   Signed by:   Clementeen Graham MD on 10/03/2010   Method used:   Print then Give to Patient   RxID:   [id number]    Orders Added: 1)  Urinalysis-[facility] [00000] 2)  U Preg-[facility] [81025] 3)  [facility]- Est Level  3 [56433]    Laboratory Results   Urine Tests  Date/Time Received: October 03, 2010 4:53 PM  Date/Time Reported: October 03, 2010 5:36 PM   Routine Urinalysis   Color: yellowish/reddish Appearance: slightly Cloudy Glucose: negative   (Normal Range: Negative) Bilirubin: negative   (Normal Range: Negative) Ketone: negative   (Normal Range: Negative) Spec. Gravity: 1.015   (Normal Range: 1.003-1.035) Blood: large   (Normal Range: Negative) pH: 6.0   (Normal Range: 5.0-8.0) Protein: negative   (Normal Range: Negative) Urobilinogen: 0.2   (Normal Range: 0-1) Nitrite: negative   (Normal Range: Negative) Leukocyte Esterace: negative   (Normal Range: Negative)  Urine Microscopic WBC/HPF: 1-3 RBC/HPF:  loaded Bacteria/HPF: 2+ cocci Epithelial/HPF: 1-5    Urine HCG: negative Comments: ...............test performed by......[name] KitchenBonnie A. [place], MLS (ASCP)cm

## 2011-01-03 NOTE — Miscellaneous (Signed)
Summary: Call from Hand Surg   Clinical Lists Changes Dr. 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  Athetoid movements would need to be controled (by Botox paralysis) prior to surg.  Needs tertiary care referral - [facility].  Suggests Dr. Boris Sharper neuro at [facility].  Two hand surg Dr. Gerlene Burdock or Dr. Jefm Petty.  First refer to hand surg.

## 2011-01-03 NOTE — Miscellaneous (Signed)
Summary: Changing Prob List    Clinical Lists Changes  Problems: Removed problem of VIRAL URI (ICD-465.9) Removed problem of NAUSEA (ICD-787.02) Removed problem of SKIN RASH (ICD-782.1) Removed problem of HEADACHE (ICD-784.0) Removed problem of FATIGUE (ICD-780.79) Removed problem of SYNCOPE AND COLLAPSE (ICD-780.2) Removed problem of CHEST PAIN-UNSPECIFIED (ICD-786.50) Removed problem of SCREENING FOR MALIGNANT NEOPLASM OF THE CERVIX (ICD-V76.2) Removed problem of CONSTIPATION (ICD-564.00) Removed problem of TACHYCARDIA (ICD-785) Removed problem of MENORRHAGIA (ICD-626.2) Removed problem of ENDOMETRIOSIS (ICD-617.9) Removed problem of MIGRAINE HEADACHE (ICD-346.90) Removed problem of PRESSURE ULCER STAGE II (ICD-707.22) Removed problem of CYSTITIS, RECURRENT (ICD-595.9) Observations: Added new observation of PRIMARY MD: CAT TA MD (10/10/2010 20:00)

## 2011-01-03 NOTE — Assessment & Plan Note (Signed)
 Summary: f/u fall/[place]   Primary Care Provider:  CAT TA MD   History of Present Illness: 35F here after fall 5 days ago.   Fell off porch and has had neck and upper back pain since.  No LOC/HA.  No numbness/tingling, no radiation of pain.  No bowel/bladder incontinence, able to move all 4's, however still with pain on movement of neck.  Recently went to [facility] but was DC'ed without any imaging per pt.  Darvocet not helping for pain.  Allergies: No Known Drug Allergies  Past History:  Past Surgical History: Last updated: 11/04/2008 Baclofen  pump insertion  8/00 Colposcopy--7/01--LGSIL - 07/08/2000 C-section x 2:  1997 & 2003 BTL 2003 Gallbladder U/S--normal  1/98 -,  I&D of right LE MRSA abscess - 02/27/2005,  Laproscopic excision of endometriosis and laproscopic uterosacral nerve ablation (LUNA) procedure 10/2001 Left Carpal Tunnel Ligament Release 08/2008 by Dr Leonor  Family History: Last updated: 01/29/2007 No DM, CAD, HTN.  One GM d/o colon CA, the other of breast CA.  Social History: Last updated: 02/22/2009 Dempsey Boos, who has schizophrenia, MR, is father of daughter.  Dempsey and pt are no longer together.  Dempsey does not see Gerardine. Daughter is 74 yo, Stage Manager, lives with pt.  80, 47 yo son, lives with pt's parents. 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 No apparent alopecia or balding. Eyes:  No corneal or conjunctival inflammation noted. EOMI. Perrla. Ears:  External ear exam shows no significant lesions or deformities.   Nose:  External nasal examination shows no deformity or inflammation.  Mouth:  Poor dentition Neck:  Pain with rotation (L&R), extension, flexion, and palpation, particularly over C3-5.  No stepoffs palpable. Lungs:  Normal respiratory effort, chest expands symmetrically. Lungs are clear to auscultation, no crackles or wheezes. Heart:  Normal rate and regular rhythm. S1 and S2 normal without gallop, murmur, click, rub or other extra sounds. Msk:  Also with pain in upper thoracic spine, no stepoffs. Neurologic:  Choreoathetoid Quad CP wheelchair bound, unable to assess extremity strength however pt endorses no changes in her ability to move or feel extremities.   Impression & Recommendations:  Problem # 1:  NECK PAIN, ACUTE (ICD-723.1) Assessment New s/p fall off porch 5 days ago.  Will image neck to r/o cervical or upper thoracic fracture.  Toradol  30mg  IM x1 in office, will give #30 percocet as Darvocet not helping pain.  Will f/u imaging.  If no acute Fx, then this is likely cervical strain/sprain and should improve with time/analgesics.  No warning signs, has neurogenic bladder at baseline.  No stool  incontinence per pt.    Her updated medication list for this problem includes:    Baclofen  10 Mg Tabs (Baclofen ) .SABRA... Take 1 tablet by mouth four times a day    Tramadol  Hcl 50 Mg Tabs (Tramadol  hcl) .SABRA... 1;2 two times a day - qid prn     Diclofenac  Sodium 50 Mg Tbec (Diclofenac  sodium) .SABRA... 1 tablet by mouth two times a day for pain    Darvocet A500 100-500 Mg Tabs (Propoxyphene n-apap) .SABRA... 1 tablet by mouth twice daily as needed for pain    Tylenol  With Codeine #3 300-30 Mg Tabs (Acetaminophen -codeine) ..... Oen qid as needed cough suprression    Percocet 7.5-500 Mg Tabs (Oxycodone -acetaminophen ) ..... One tab by mouth q4h as needed for pain  Orders: Diagnostic X-Ray/Fluoroscopy (Diagnostic X-Ray/Flu) [facility]- Est Level  3 (00786) Ketorolac -Toradol  15mg  (G8114)  Complete Medication List: 1)  Baclofen  10 Mg Tabs (Baclofen ) .... Take 1 tablet by mouth four times a day 2)  Toprol  Xl 100 Mg Tb24 (Metoprolol  succinate) .... Take 1 tablet by mouth once a day 3)  Tramadol  Hcl 50 Mg Tabs (Tramadol  hcl) .... 1;2 two times a day - qid prn 4)  Xanax 0.5 Mg Tabs (Alprazolam) .... One by mouth three times a day per psych 5)  Zoloft  100 Mg Tabs (Sertraline  hcl) .... 2 tabs by mouth qday 6)  Prilosec 40 Mg Cpdr (Omeprazole ) .... One tab by mouth qday 7)  Seroquel  300 Mg Tabs (Quetiapine  fumarate) .... One tab by mouth qhs 8)  Effexor  Xr 150 Mg Cp24 (Venlafaxine  hcl) .... One tab by mouth qday per psych 9)  Imitrex  100 Mg Tabs (Sumatriptan  succinate) .... One tab by mouth at start of headache.  okay to take another tab 2 hours later, but no more than 200 mg in 24 hours. 10)  Depo-provera 150 Mg/ml Im Susp (Medroxyprogesterone acetate) .... Q1m 11)  Diclofenac  Sodium 50 Mg Tbec (Diclofenac  sodium) .SABRA.. 1 tablet by mouth two times a day for pain 12)  Darvocet A500 100-500 Mg Tabs (Propoxyphene n-apap) .SABRA.. 1 tablet by mouth twice daily as needed for pain 13)  Detrol 1 Mg Tabs (Tolterodine tartrate) .SABRA.. 1 tab by mouth two times a day 14)  Gfn 1200/dm 60 1200-60 Mg Xr12h-tab (Dextromethorphan-guaifenesin ) .... One tablet by mouth two times a day as needed cough 15)  Albuterol Sulfate (2.5 Mg/3ml) 0.083% Nebu (Albuterol sulfate) ....  Breathing treatment qid as needed , use for a least one month, qs for 1 months 16)  Tylenol  With Codeine #3 300-30 Mg Tabs (Acetaminophen -codeine) .... Oen qid as needed cough suprression 17)  Lidocaine  Viscous 2 % Soln (Lidocaine  hcl) .... Paint small amount on tongue with cotton swab up to qid as needed for pain.  give 1 small bottle 18)  Percocet 7.5-500 Mg Tabs (Oxycodone -acetaminophen ) .... One tab by mouth q4h as needed for pain  Patient Instructions: 1)  Great to meet you today, 2)  Hopefully the shot of toradol  should help your pain. 3)  I will give you a script for percocet, and send you for Xrays of your neck and upper back. 4)  Come back to see me if the pain doesn't get any better or gets worse.  If you start to have bowel incontinence then come back to see me. 5)  -Dr. ONEIDA. Prescriptions: PERCOCET 7.5-500 MG TABS (OXYCODONE -ACETAMINOPHEN ) One tab by mouth q4h as needed for pain  #30 x 0   Entered and Authorized by:   Debby Petties MD  Signed by:   Debby Petties MD on 06/14/2009   Method used:   Print then Give to Patient   RxID:   [id number]    Medication Administration  Injection # 1:    Medication: Ketorolac -Toradol  15mg     Diagnosis: NECK PAIN, ACUTE (ICD-723.1)    Route: IM    Site: LUOQ gluteus    Exp Date: 11/01/2010    Lot #: [id number]    Mfr: [email or web address]    Comments: 30 mg IM per order    Patient tolerated injection without complications    Given by: Donzell Ip RN (June 14, 2009 5:08 PM)  Orders Added: 1)  Diagnostic X-Ray/Fluoroscopy [Diagnostic X-Ray/Flu] 2)  [facility]- Est Level  3 [00786] 3)  Ketorolac -Toradol  15mg  [J1885]

## 2011-01-03 NOTE — Progress Notes (Signed)
Summary: triage   Phone Note Other Incoming Call back at [phone] ext [phone]   Caller: [facility] Summary of Call: Pt called them and asked them to pick up her oxygen.  If that is to be the case they need an order for this.  But they think the pt should be called first and asked why she wants it picked up. Initial call taken by: Clydell Hakim,  July 19, 2010 9:27 AM  Follow-up for Phone Call        LM for Shanda Bumps when she calls back, pt had told me 2 wks ago that she does not use it & wants it out of her home Follow-up by: Golden Circle RN,  July 19, 2010 9:31 AM

## 2011-01-03 NOTE — Assessment & Plan Note (Signed)
Summary: TO SEE NEAL AT 10:30AM/KH   Vital Signs:  Patient profile:   35 year old female Weight:      146 pounds Temp:     97.6 degrees F oral Pulse rate:   73 / minute Pulse rhythm:   regular BP sitting:   182 / 85  (left arm) Cuff size:   regular  Vitals Entered By: Loralee Pacas CMA (November 12, 2010 10:35 AM)  Primary Care Provider:  CAT TA MD   History of Present Illness: [name] [name] is here for endometrial biopsy. She took one of the valiumpre-prcedure as directed.  Current Medications (verified): 1)  Baclofen 10 Mg Tabs (Baclofen) .... Take 1 Tablet By Mouth Four Times A Day 2)  Toprol Xl 100 Mg Tb24 (Metoprolol Succinate) .... Take 1 Tablet By Mouth Once A Day 3)  Seroquel 200 Mg  Tabs (Quetiapine Fumarate) .... One Tab By Mouth Qhs 4)  Lamictal 150 Mg Tabs (Lamotrigine) .... Per Dr Sharene Skeans 5)  Klonopin 1 Mg Tabs (Clonazepam) .... Per Dr Sharene Skeans 6)  Zanaflex 2 Mg Caps (Tizanidine Hcl) .... Per Dr Sharene Skeans 7)  Oxybutynin Chloride 5 Mg Tabs (Oxybutynin Chloride) .... One Tab By Mouth Three Times A Day For Overactive Bladder 8)  Aerochamber Mv  Misc (Spacer/aero-Holding Chambers) .... To Use With Mdi 9)  Miralax  Powd (Polyethylene Glycol 3350) .... As Needed 10)  Imitrex 50 Mg Tabs (Sumatriptan Succinate) .[name] Kitchen.. 1 Tab By Mouth At Onset of Headache. Can Repeat in 2 Hours If Headache Persists.  Max 200mg  (4 Tabs Per Day) 11)  Amlodipine Besylate 5 Mg Tabs (Amlodipine Besylate) .... Take One Tablet By Mouth Daily 12)  Albuterol Sulfate (2.5 Mg/58ml) 0.083% Nebu (Albuterol Sulfate) .... 2.5mg -5mg  Inhaled By Nebulizer Every 4 Hours As Needed Shortness of Breath.  Dispense 1. 13)  Nebulizer/adult Mask  Kit (Respiratory Therapy Supplies) .... Use As Directed With [employer].  Dispense 1 14)  Ibuprofen 600 Mg Tabs (Ibuprofen) .[name] Kitchen.. 1 Tab By Mouth Three Times A Day As Needed Pain 15)  Tramadol Hcl 50 Mg Tabs (Tramadol Hcl) .[name] Kitchen.. 1 Tab By Mouth Three Times A Day For Pain 16)  Valium 5 Mg  Tabs (Diazepam) .... Sig One By Mouth 1 Hour Prior To Procedure and May Repeat One Tab At Time of Procedure  Allergies: No Known Drug Allergies  Physical Exam  Genitalia:  scant amount of thin mucous.normal introitus, no external lesions, mucosa pink and moist, and no vaginal or cervical lesions.   Additional Exam:  Patient given informed consent, signed copy in the chart. Placed in lithotomy position.This was accomplished using two other people to support her legs; we did nto use stirrups.  Bimanual exam performed. Cervix viewed with sterile speculum.  A pap smear was pbtained.The portio of the cervix was cleansed with individual betadine swabs. The cervix was secured with a tenaculum placed in the anterior lip. The uterus was measured using sound. Endometrial sample was taken using GynoSampler pipelle. All equipment was removed. The patient tolerated the procedure well and was given post procedure instructions. we will contact her with any pathology result    Impression & Recommendations:  Problem # 1:  SCREENING FOR MALIGNANT NEOPLASM OF THE CERVIX (ICD-V76.2)  Orders: Pap Smear-[facility] (21308-65784) [facility]- Est Level  3 (69629)  Problem # 2:  DYSFUNCTIONAL UTERINE BLEEDING (ICD-626.8)  Orders: Ketorolac-Toradol 15mg  (B2841) [facility]- Est Level  3 (32440) Endometrial/Endocerv BX- [facility] (10272) Gynecologic Referral (Gyn) [name] [name] was determined to have endometrial biopsy as her mother  evidently had to have hysterectomy for some type of cancer( ?).  I had discussed this with her at previous visit but to no avail. Given her level of anxiety, I was unable to convince her that the PUS results were reassurance enough regaridng her abnromal bleeding, We performed pap, bimanual and endometrial biopsy. I gave her osme toradol forpost procedure cramping. I will notify her of results. She wasnt to pursue hysterectomy which I think is not reasonable. Perhaps she would be a candidate for an ablation. After much  discussion I finally agreed to send her for further consultation to [facility] GYN clinic.  Complete Medication List: 1)  Baclofen 10 Mg Tabs (Baclofen) .... Take 1 tablet by mouth four times a day 2)  Toprol Xl 100 Mg Tb24 (Metoprolol succinate) .... Take 1 tablet by mouth once a day 3)  Seroquel 200 Mg Tabs (quetiapine Fumarate)  .... One tab by mouth qhs 4)  Lamictal 150 Mg Tabs (Lamotrigine) .... Per dr Sharene Skeans 5)  Klonopin 1 Mg Tabs (Clonazepam) .... Per dr hickling 6)  Zanaflex 2 Mg Caps (Tizanidine hcl) .... Per dr Sharene Skeans 7)  Oxybutynin Chloride 5 Mg Tabs (Oxybutynin chloride) .... One tab by mouth three times a day for overactive bladder 8)  Aerochamber Mv Misc (Spacer/aero-holding chambers) .... To use with mdi 9)  Miralax Powd (Polyethylene glycol 3350) .... As needed 10)  Imitrex 50 Mg Tabs (Sumatriptan succinate) .[name] Kitchen.. 1 tab by mouth at onset of headache. can repeat in 2 hours if headache persists.  max 200mg  (4 tabs per day) 11)  Amlodipine Besylate 5 Mg Tabs (Amlodipine besylate) .... Take one tablet by mouth daily 12)  Albuterol Sulfate (2.5 Mg/65ml) 0.083% Nebu (Albuterol sulfate) .... 2.5mg -5mg  inhaled by nebulizer every 4 hours as needed shortness of breath.  dispense 1. 13)  Nebulizer/adult Mask Kit (Respiratory therapy supplies) .... Use as directed with neb machine.  dispense 1 14)  Ibuprofen 600 Mg Tabs (Ibuprofen) .[name] Kitchen.. 1 tab by mouth three times a day as needed pain 15)  Tramadol Hcl 50 Mg Tabs (Tramadol hcl) .[name] Kitchen.. 1 tab by mouth three times a day for pain 16)  Valium 5 Mg Tabs (Diazepam) .... Sig one by mouth 1 hour prior to procedure and may repeat one tab at time of procedure   Medication Administration  Injection # 1:    Medication: Ketorolac-Toradol 15mg     Diagnosis: DYSFUNCTIONAL UTERINE BLEEDING (ICD-626.8)    Route: IM    Site: RUOQ gluteus    Exp Date: 05/02/2012    Lot #: [id number]    Mfr: hospira    Patient tolerated injection without complications    Given  by: Loralee Pacas CMA (November 12, 2010 11:22 AM)  Orders Added: 1)  Ketorolac-Toradol 15mg  [J1885] 2)  Pap Smear-[facility] [[id number]] 3)  [facility]- Est Level  3 [99213] 4)  Endometrial/Endocerv BX- [facility] [58100] 5)  Gynecologic Referral [Gyn]

## 2011-01-03 NOTE — Miscellaneous (Signed)
Summary: BP reading  Clinical Lists Changes     The patient brought her wrist automatic cuff BP=148/86(R), manual BP 108/70.  Discussed with Dr. Algis Downs. Bensimhon's nurse Herbert Seta) that the patient is concerned that her BP has been up when has a headache.New script Amlodipine added last week. Herbert Seta wants the patient to continue monitoring BP, and to call if remains up. Adanna Zuckerman,RN. (The patient had a myoview today, see report).Manual BP after patient assisted to bathroom 140/88, automatic cuff BP=148/93. P.Pasco Marchitto,RN.

## 2011-01-03 NOTE — Progress Notes (Signed)
   Phone Note Outgoing Call   Call placed by: Milinda Antis MD,  December 26, 2010 12:23 PM Details for Reason: f/u Cough Summary of Call: Pt states she is doing a little better, still has cough but mucous is breaking up some. Has not used neb but forgets to do so sometimes.Completed antibiotics. Will schedule a visit in 1 week.

## 2011-01-03 NOTE — Assessment & Plan Note (Signed)
Summary: uti per pt/[place]/Ta   Vital Signs:  Patient profile:   35 year old female Weight:      159 pounds BMI:     32.23 Temp:     97.5 degrees F oral Pulse rate:   89 / minute BP sitting:   140 / 91  (left arm)  Vitals Entered By: Jimmy Footman, CMA (June 07, 2010 4:03 PM) CC: ?uti Pain Assessment Patient in pain? yes        Primary Provider:  CAT TA MD  CC:  ?uti.  History of Present Illness: 1.UTI?- Pt. complaining of burning with urination x3-4 days.  Also having urinary frequency.  She does have a history of recurrent cystitis and states that this feels like the UTI's she has had in the past.  She denies any blood in the urine, fever, chills, vaginal discharge, flank pain, back pain, nausea, vomiting.  Current Medications (verified): 1)  Baclofen 10 Mg Tabs (Baclofen) .... Take 1 Tablet By Mouth Four Times A Day 2)  Toprol Xl 100 Mg Tb24 (Metoprolol Succinate) .... Take 1 Tablet By Mouth Once A Day 3)  Seroquel 200 Mg  Tabs (Quetiapine Fumarate) .... One Tab By Mouth Qhs 4)  Effexor Xr 150 Mg Cp24 (Venlafaxine Hcl) .... One Tab By Mouth Qday Per Psych 5)  Depo-Provera 150 Mg/ml Im Susp (Medroxyprogesterone Acetate) .... Q51m 6)  Hydrocodone-Acetaminophen 5-325 Mg Tabs (Hydrocodone-Acetaminophen) .... One Tab By Mouth Q 3-4 Hrs As Needed Pain 7)  Lamictal 150 Mg Tabs (Lamotrigine) .... Per Dr Sharene Skeans 8)  Klonopin 1 Mg Tabs (Clonazepam) .... Per Dr Sharene Skeans 9)  Zanaflex 2 Mg Caps (Tizanidine Hcl) .... Per Dr Sharene Skeans 10)  Oxybutynin Chloride 5 Mg Tabs (Oxybutynin Chloride) .... One Tab By Mouth Three Times A Day For Overactive Bladder 11)  Aerochamber Mv  Misc (Spacer/aero-Holding Chambers) .... To Use With Mdi 12)  Ibuprofen 600 Mg Tabs (Ibuprofen) .[name] Kitchen.. 1 Tab By Mouth Two Times A Day As Needed Pain 13)  Fluticasone Propionate 0.05 % Crea (Fluticasone Propionate) .... As Needed 14)  Restoril 22.5 Mg Caps (Temazepam) .... At Bedtime 15)  Miralax  Powd (Polyethylene Glycol  3350) .... As Needed 16)  Imitrex 50 Mg Tabs (Sumatriptan Succinate) .... As Needed 17)  Amlodipine Besylate 5 Mg Tabs (Amlodipine Besylate) .... Take One Tablet By Mouth Daily 18)  Albuterol Sulfate (2.5 Mg/83ml) 0.083% Nebu (Albuterol Sulfate) .... 2.5mg -5mg  Inhaled By Nebulizer Every 4 Hours As Needed Shortness of Breath.  Dispense 1. 19)  Nebulizer/adult Mask  Kit (Respiratory Therapy Supplies) .... Use As Directed With [employer].  Dispense 1 20)  Oxycodone-Acetaminophen 5-325 Mg Tabs (Oxycodone-Acetaminophen) .... 1/2 Tab By Mouth Every 3-4 Hours As Needed Pain.  Do Not Take With Hydrocodone or Tylenol 21)  Bacitracin 500 Unit/gm Oint (Bacitracin) .... Apply To Wounds Two Times A Day As Needed To Prevent Infection.  Large Op 22)  Cipro 250 Mg Tabs (Ciprofloxacin Hcl) .[name] Kitchen.. 1 Tab By Mouth Two Times A Day For 3 Days  Allergies (verified): No Known Drug Allergies  Physical Exam  General:  alert, well-developed, and well-nourished white female in nad.  She is in motorized chair Lungs:  normal respiratory effort and normal breath sounds.   Heart:  normal rate, regular rhythm, no murmur, no gallop, and no rub.   Abdomen:  Some suprapubic tenderness.  No rebound or guarding.  Normoactive BS   Impression & Recommendations:  Problem # 1:  CYSTITIS, RECURRENT (ICD-595.9)  UA essentially normal.  Since this problem is recurrent she may be in the early stages, and this feels like bladder infections she has had in the past I am going to go ahead and give her Cipro.  I instructed her to take all of her antibiotic.  She is to call back if having fever, chills, flank or back pain. Her updated medication list for this problem includes:    Oxybutynin Chloride 5 Mg Tabs (Oxybutynin chloride) ..... One tab by mouth three times a day for overactive bladder    Cipro 250 Mg Tabs (Ciprofloxacin hcl) .[name] Kitchen... 1 tab by mouth two times a day for 3 days  Orders: [facility]- Est Level  3 (16109)  Complete Medication  List: 1)  Baclofen 10 Mg Tabs (Baclofen) .... Take 1 tablet by mouth four times a day 2)  Toprol Xl 100 Mg Tb24 (Metoprolol succinate) .... Take 1 tablet by mouth once a day 3)  Seroquel 200 Mg Tabs (quetiapine Fumarate)  .... One tab by mouth qhs 4)  Effexor Xr 150 Mg Cp24 (Venlafaxine hcl) .... One tab by mouth qday per psych 5)  Depo-provera 150 Mg/ml Im Susp (Medroxyprogesterone acetate) .... Q68m 6)  Hydrocodone-acetaminophen 5-325 Mg Tabs (Hydrocodone-acetaminophen) .... One tab by mouth q 3-4 hrs as needed pain 7)  Lamictal 150 Mg Tabs (Lamotrigine) .... Per dr Sharene Skeans 8)  Klonopin 1 Mg Tabs (Clonazepam) .... Per dr Sharene Skeans 9)  Zanaflex 2 Mg Caps (Tizanidine hcl) .... Per dr Sharene Skeans 10)  Oxybutynin Chloride 5 Mg Tabs (Oxybutynin chloride) .... One tab by mouth three times a day for overactive bladder 11)  Aerochamber Mv Misc (Spacer/aero-holding chambers) .... To use with mdi 12)  Ibuprofen 600 Mg Tabs (Ibuprofen) .[name] Kitchen.. 1 tab by mouth two times a day as needed pain 13)  Fluticasone Propionate 0.05 % Crea (Fluticasone propionate) .... As needed 14)  Restoril 22.5 Mg Caps (Temazepam) .... At bedtime 15)  Miralax Powd (Polyethylene glycol 3350) .... As needed 16)  Imitrex 50 Mg Tabs (Sumatriptan succinate) .... As needed 17)  Amlodipine Besylate 5 Mg Tabs (Amlodipine besylate) .... Take one tablet by mouth daily 18)  Albuterol Sulfate (2.5 Mg/18ml) 0.083% Nebu (Albuterol sulfate) .... 2.5mg -5mg  inhaled by nebulizer every 4 hours as needed shortness of breath.  dispense 1. 19)  Nebulizer/adult Mask Kit (Respiratory therapy supplies) .... Use as directed with neb machine.  dispense 1 20)  Oxycodone-acetaminophen 5-325 Mg Tabs (Oxycodone-acetaminophen) .... 1/2 tab by mouth every 3-4 hours as needed pain.  do not take with hydrocodone or tylenol 21)  Bacitracin 500 Unit/gm Oint (Bacitracin) .... Apply to wounds two times a day as needed to prevent infection.  large op 22)  Cipro 250 Mg Tabs  (Ciprofloxacin hcl) .[name] Kitchen.. 1 tab by mouth two times a day for 3 days  Other Orders: Urinalysis-[facility] (00000)  Patient Instructions: 1)  We are giving you an antibiotic (ciprofloxacin) for a bladder infection 2)  Please take all of your antibiotic 3)  If you start having fever, chills, pain in your back or flank please call us sooner. Prescriptions: CIPRO 250 MG TABS (CIPROFLOXACIN HCL) 1 tab by mouth two times a day for 3 days  #6 x 0   Entered and Authorized by:   Everrett Coombe DO   Signed by:   Everrett Coombe DO on 06/07/2010   Method used:   Electronically to        [facility] (retail)       [address]  [place], [place]  [id number]       Ph: [phone]       Fax: [phone]   RxID:   [id number]   Laboratory Results   Urine Tests  Date/Time Received: June 07, 2010 4:04 PM  Date/Time Reported: June 07, 2010 4:15 PM   Routine Urinalysis   Color: yellow Appearance: Clear Glucose: negative   (Normal Range: Negative) Bilirubin: negative   (Normal Range: Negative) Ketone: negative   (Normal Range: Negative) Spec. Gravity: 1.015   (Normal Range: 1.003-1.035) Blood: negative   (Normal Range: Negative) pH: 5.5   (Normal Range: 5.0-8.0) Protein: negative   (Normal Range: Negative) Urobilinogen: 0.2   (Normal Range: 0-1) Nitrite: negative   (Normal Range: Negative) Leukocyte Esterace: negative   (Normal Range: Negative)    Comments: ...............test performed by......[name] KitchenBonnie A. [place], MLS (ASCP)cm

## 2011-01-03 NOTE — Assessment & Plan Note (Signed)
Summary: continued HA/[place]/Ta   Vital Signs:  Patient profile:   35 year old female Height:      59 inches Weight:      161.2 pounds BMI:     32.68 Temp:     97.8 degrees F oral Pulse rate:   78 / minute BP sitting:   127 / 85  (left arm) Cuff size:   regular  Vitals Entered By: Gladstone Pih (May 01, 2010 10:00 AM) CC: C/O cont HA Is Patient Diabetic? No Pain Assessment Patient in pain? yes     Location: head Intensity: 8 Type: aching Onset of pain  since fall   Primary Care Provider:  CAT TA MD  CC:  C/O cont HA.  History of Present Illness: CC: f/u HA  1 wk ago suffered fall out of wheelchair, went to ER 5/26, received forehead stitches and hydrocodone which helps HA.  Now with continued headaches.    No LOC, vomiting.  + occasional nausea.  No head CT obtained.  to have sutures removed today.  Does note that using reading glasses because prescription glasses broke in fall.  wonders if this is aggravating headache.  Taking oxycodone (1/2 pill because o/w too sleepy) and hydrocodone for headaches.  Headaches slowly getting worse.  B periorbital ecchymosis (R side bigger and new).  headache generalized, not like previous migraines.  No facial pain other than expected.  no rhinorrhea.  Habits & Providers  Alcohol-Tobacco-Diet     Tobacco Status: never  Allergies (verified): No Known Drug Allergies PMH-FH-SH reviewed for relevance  Physical Exam  General:  Well-developed,well-nourished,in no acute distress; alert,appropriate and cooperative throughout examination, slurred speech at basline Head:  L forehead lac, wound C/D/I, abrasion and bruising of L cheek/lips/jaw - erythematous reaction around abrasions (using neosporin).  No facial bone tenderness.  + racoon eyes bilaterally R>L Eyes:  PERRLA, EOMI Nose:  External nasal examination shows no deformity or inflammation.  No nasal bridge tenderness Mouth:  missing multiple teeth with caries and fillings in others.   MMM, oropharynx clear Neck:  supple.   Neurologic:  Choreoathetoid Quad CP wheelchair bound, difficult to assess neurological status given CP but seems to be at baseline.   Impression & Recommendations:  Problem # 1:  INJURY OF FACE AND NECK OTHER AND UNSPECIFIED (ICD-959.09)  sutures removed today.  ? neosporin causing irritant dermatitis so changed to bacitracin or just vaseline gauze.  Precepted with Dr. [place] - given racoon eyes and unalbe to get good neuro exam in this CP patient, will obtain head CT to r/o basilar fracture at 4:30pm today.  Call [phone] with results.  Orders: [facility]- Est  Level 4 (62952) CT without Contrast (CT w/o contrast)  Problem # 2:  HEADACHE (ICD-784.0)  toradol shot.  Cr 0.7 04/2010.  Given recent trauma, expect to see HA.  No facial bone point tenderness to suggest fracture.  No red flags today.  not on blood thinners.  advised to start ibuprofen that she has at home (currently already taking narcotic and acetaminophen.  If headaches worsen or other red flags, return sooner.  O/w f/u at end of week or early next week.   Her updated medication list for this problem includes:    Toprol Xl 100 Mg Tb24 (Metoprolol succinate) .[name] Kitchen... Take 1 tablet by mouth once a day    Hydrocodone-acetaminophen 5-325 Mg Tabs (Hydrocodone-acetaminophen) ..... One tab by mouth q 3-4 hrs as needed pain    Ibuprofen 600 Mg Tabs (Ibuprofen) .[name] KitchenMarland KitchenMarland KitchenMarland Kitchen  1 tab by mouth two times a day as needed pain    Imitrex 50 Mg Tabs (Sumatriptan succinate) .[name] Kitchen... As needed    Oxycodone-acetaminophen 5-325 Mg Tabs (Oxycodone-acetaminophen) .[name] Kitchen... 1/2 tab by mouth every 3-4 hours as needed pain.  do not take with hydrocodone or tylenol  Orders: Ketorolac-Toradol 15mg  (Z6109) [facility]- Est  Level 4 (60454)  Complete Medication List: 1)  Baclofen 10 Mg Tabs (Baclofen) .... Take 1 tablet by mouth four times a day 2)  Toprol Xl 100 Mg Tb24 (Metoprolol succinate) .... Take 1 tablet by mouth once a day 3)   Seroquel 200 Mg Tabs (quetiapine Fumarate)  .... One tab by mouth qhs 4)  Effexor Xr 150 Mg Cp24 (Venlafaxine hcl) .... One tab by mouth qday per psych 5)  Depo-provera 150 Mg/ml Im Susp (Medroxyprogesterone acetate) .... Q50m 6)  Hydrocodone-acetaminophen 5-325 Mg Tabs (Hydrocodone-acetaminophen) .... One tab by mouth q 3-4 hrs as needed pain 7)  Lamictal 150 Mg Tabs (Lamotrigine) .... Per dr Sharene Skeans 8)  Klonopin 1 Mg Tabs (Clonazepam) .... Per dr Sharene Skeans 9)  Zanaflex 2 Mg Caps (Tizanidine hcl) .... Per dr Sharene Skeans 10)  Oxybutynin Chloride 5 Mg Tabs (Oxybutynin chloride) .... One tab by mouth three times a day for overactive bladder 11)  Aerochamber Mv Misc (Spacer/aero-holding chambers) .... To use with mdi 12)  Ibuprofen 600 Mg Tabs (Ibuprofen) .[name] Kitchen.. 1 tab by mouth two times a day as needed pain 13)  Fluticasone Propionate 0.05 % Crea (Fluticasone propionate) .... As needed 14)  Restoril 22.5 Mg Caps (Temazepam) .... At bedtime 15)  Miralax Powd (Polyethylene glycol 3350) .... As needed 16)  Imitrex 50 Mg Tabs (Sumatriptan succinate) .... As needed 17)  Amlodipine Besylate 5 Mg Tabs (Amlodipine besylate) .... Take one tablet by mouth daily 18)  Albuterol Sulfate (2.5 Mg/43ml) 0.083% Nebu (Albuterol sulfate) .... 2.5mg -5mg  inhaled by nebulizer every 4 hours as needed shortness of breath.  dispense 1. 19)  Nebulizer/adult Mask Kit (Respiratory therapy supplies) .... Use as directed with neb machine.  dispense 1 20)  Oxycodone-acetaminophen 5-325 Mg Tabs (Oxycodone-acetaminophen) .... 1/2 tab by mouth every 3-4 hours as needed pain.  do not take with hydrocodone or tylenol 21)  Bacitracin 500 Unit/gm Oint (Bacitracin) .... Apply to wounds two times a day as needed to prevent infection.  large op  Patient Instructions: 1)  Return this week to see [place] for follow up. 2)  Head CT today to r/o fracture. 3)  Stop neosporin ointment. 4)  Use Bacitracin ointment or just plain vaseline ointment with  gauze to keep moist. 5)  Toradol shot today.  Sutures removed today.  Starting tomorrow, add ibuprofen to your pain regimen.  If headache worsening or if vomiting, or dizzy or other concerning symptom, please return sooner or seek urgent medical care. 6)  Good to see you today.   Prescriptions: BACITRACIN 500 UNIT/GM OINT (BACITRACIN) apply to wounds two times a day as needed to prevent infection.  large OP  #1 x 1   Entered and Authorized by:   Eustaquio Boyden  MD   Signed by:   Eustaquio Boyden  MD on 05/01/2010   Method used:   Print then Give to Patient   RxID:   [id number] BACITRACIN 500 UNIT/GM OINT (BACITRACIN) apply to wounds two times a day as needed to prevent infection.  large OP  #1 x 1   Entered and Authorized by:   Eustaquio Boyden  MD   Signed by:   Wynona Canes  Sharen Hones  MD on 05/01/2010   Method used:   Electronically to        [facility]* (retail)       [address]       [place], [place]  [address]       Ph: [phone]       Fax: [phone]   RxID:   [id number]      Medication Administration  Injection # 1:    Medication: Ketorolac-Toradol 15mg     Diagnosis: HEADACHE (ICD-784.0)    Route: IM    Site: LUOQ gluteus    Exp Date: 06/02/2011    Lot #: [id number]    Mfr: [email or web address]    Patient tolerated injection without complications    Given by: Loralee Pacas CMA (May 01, 2010 11:18 AM)  Orders Added: 1)  Ketorolac-Toradol 15mg  [J1885] 2)  [facility]- Est  Level 4 [33295] 3)  CT without Contrast [CT w/o contrast]

## 2011-01-03 NOTE — Progress Notes (Signed)
Summary: phn msg   Phone Note Call from Patient Call back at Home Phone [phone]   Caller: Patient Summary of Call: pt states she is having cramps and making her tired or worn out - bleed heavy in toilet but not on her pad - not sure if she should wait til Wed Initial call taken by: De Nurse,  October 19, 2010 11:42 AM  Follow-up for Phone Call        message left for patient to call back. Follow-up by: Theresia Lo RN,  October 19, 2010 12:13 PM  Additional Follow-up for Phone Call Additional follow up Details #1::        spoke with  patient and she states she is having much pain and bleeding is heavy when she sits on commode she states. she knows blood is coming from her period.  [name] Kitchen advised  that she will need to be evaluated  and we do not have an available appointment this afternoon and advised her to go to urgent care.  Additional Follow-up by: Theresia Lo RN,  October 19, 2010 12:29 PM

## 2011-01-03 NOTE — Consult Note (Signed)
Summary: [facility]   Imported By: Bradly Bienenstock 03/05/2010 11:47:25  _____________________________________________________________________  External Attachment:    Type:   Image     Comment:   External Document

## 2011-01-03 NOTE — Progress Notes (Signed)
   states she needs to be seen. tried different days & times until we found one she thinks she can make. has pain. also wants to change home care companies. told her to call the one she wants & ask them to send papers to md. very difficult to understand her voice. she also said something about O2. she had been taken off it. to discuss with md..Sally [name] RN  Apr 24, 2010 12:09 PM  spoke with Kennon Rounds.  i will be happy to see patient anytime, even if I have to be double booked.  Unfortunately I only have afternoon clinics at this time and she usually prefers morning clinics.  Gianlucca Szymborski MD  Apr 24, 2010 2:09 PM

## 2011-01-03 NOTE — Progress Notes (Signed)
Summary: triage   Phone Note Call from Patient Call back at Home Phone [phone]   Caller: Patient Summary of Call: pt is still having chest pain and wants to come  in here today  see previous note Initial call taken by: De Nurse,  December 20, 2010 1:49 PM  Follow-up for Phone Call        Still congested and short of breath.  Told her I could put her in the [place] schedule.  Said she is trying to find someone to bring her and she will do her best to get here. Follow-up by: Dennison Nancy RN,  December 20, 2010 1:57 PM

## 2011-01-03 NOTE — Assessment & Plan Note (Signed)
Summary: bp check/[place]   In for BP check today. BP checked manually using regular adult cuff. patient is taking all meds as directed now.   BP LA 124/80 pulse 80 . weight  160.5 #.  Theresia Lo RN  March 30, 2010 11:00 AM  Nurse Visit   Allergies: No Known Drug Allergies  Orders Added: 1)  No Charge Patient Arrived (NCPA0) [NCPA0]

## 2011-01-03 NOTE — Progress Notes (Signed)
Summary: triage   Phone Note Call from Patient Call back at Home Phone [phone]   Caller: Patient Summary of Call: pt is wanting to come in today Initial call taken by: De Nurse,  May 11, 2010 9:24 AM  Follow-up for Phone Call        feet are swelling. unable to come in now. states she does not eat much salt. advised laying down with feet up. wanted appt next week. she will be here at 11am monday to see her pcp Follow-up by: Golden Circle RN,  May 11, 2010 9:26 AM  Additional Follow-up for Phone Call Additional follow up Details #1::        Called pt back.  Left message that I was just checking up on her.   If not short of breath or distress, ok to wait to see me on Mon. If distressed, then call [facility] back to be seen today.  Additional Follow-up by: Sayge Brienza MD,  May 11, 2010 10:03 AM    Additional Follow-up for Phone Call Additional follow up Details #2::    attempted to call pt , no answer, left message to return call Follow-up by: Gladstone Pih,  May 11, 2010 2:24 PM

## 2011-01-03 NOTE — Assessment & Plan Note (Signed)
Summary: abdominal cramping/ACM   Vital Signs:  Patient profile:   35 year old female Weight:      147.5 pounds Temp:     98.6 degrees F oral Pulse rate:   74 / minute BP sitting:   122 / 83  (right arm)  Vitals Entered By: Arlyss Repress CMA, (November 06, 2010 2:21 PM) CC: still cramping. see previous notes. pt has upcoming appt in [place] on thursday   Primary Care Dudley Mages:  CAT TA MD  CC:  still cramping. see previous notes. pt has upcoming appt in [place] on thursday.  History of Present Illness: 35 yo female here with cramping in the abdomen that haas continued with her  35 y/o F with spastic CP, urinary incontinency, OCD, history of PE who  is here for abnormal uterine bleeding.    She has had 3 abnormal periods in 6 weeks: Pt was on Depo for birth control, last injection on  01/2009.  She has not had a period for about 7 months.  Then in October she   Menses x15 days.  Saw Dr Denyse Amass on 10/03/10.  Exam was normal.  Pelvic U/S no abnormal findings. She was seen in UC where she was Rx Ortho-Novum three times a day x 7 days and percocet for pain.  Pt has stopped the oral contraceptions she was given and was given a shot this weekend atr urgent care whihc has stopped the bleeding.  Pt though is still complaining of the pain whihc is cramping in nature.  Pt states it is not associated with food no dysuria or vaginal discharge at all.   Last pap 01/2009: negative  Pt earlierstated she  is sexually active with 1 new partner but at this office visit states she has not been sexualy active for some time meaning months.     Pt has an appointment with [facility] this thursday for a pap and endometrial biopsy that is unable to be obtained due to cerebral palsy on normal exam table. Please see Cat Ta note.   Current Medications (verified): 1)  Baclofen 10 Mg Tabs (Baclofen) .... Take 1 Tablet By Mouth Four Times A Day 2)  Toprol Xl 100 Mg Tb24 (Metoprolol Succinate) .... Take 1 Tablet By Mouth  Once A Day 3)  Seroquel 200 Mg  Tabs (Quetiapine Fumarate) .... One Tab By Mouth Qhs 4)  Lamictal 150 Mg Tabs (Lamotrigine) .... Per Dr Sharene Skeans 5)  Klonopin 1 Mg Tabs (Clonazepam) .... Per Dr Sharene Skeans 6)  Zanaflex 2 Mg Caps (Tizanidine Hcl) .... Per Dr Sharene Skeans 7)  Oxybutynin Chloride 5 Mg Tabs (Oxybutynin Chloride) .... One Tab By Mouth Three Times A Day For Overactive Bladder 8)  Aerochamber Mv  Misc (Spacer/aero-Holding Chambers) .... To Use With Mdi 9)  Miralax  Powd (Polyethylene Glycol 3350) .... As Needed 10)  Imitrex 50 Mg Tabs (Sumatriptan Succinate) .[name] Kitchen.. 1 Tab By Mouth At Onset of Headache. Can Repeat in 2 Hours If Headache Persists.  Max 200mg  (4 Tabs Per Day) 11)  Amlodipine Besylate 5 Mg Tabs (Amlodipine Besylate) .... Take One Tablet By Mouth Daily 12)  Albuterol Sulfate (2.5 Mg/88ml) 0.083% Nebu (Albuterol Sulfate) .... 2.5mg -5mg  Inhaled By Nebulizer Every 4 Hours As Needed Shortness of Breath.  Dispense 1. 13)  Nebulizer/adult Mask  Kit (Respiratory Therapy Supplies) .... Use As Directed With [employer].  Dispense 1 14)  Ibuprofen 600 Mg Tabs (Ibuprofen) .[name] Kitchen.. 1 Tab By Mouth Three Times A Day As Needed Pain 15)  Tramadol Hcl 50 Mg Tabs (Tramadol Hcl) .[name] Kitchen.. 1 Tab By Mouth Three Times A Day For Pain 16)  Diclofenac Sodium 50 Mg Tbec (Diclofenac Sodium) .[name] Kitchen.. 1 Tab By Mouth Two Times A Day For The Next 5 Days  Allergies (verified): No Known Drug Allergies  Past History:  Past medical, surgical, family and social histories (including risk factors) reviewed, and no changes noted (except as noted below).  Past Medical History: Reviewed history from 06/21/2010 and no changes required. 1. migraines?--tx`d successfully w/ Imitrex (3/06) and prophylaxic by neuro 2. 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 DJD 4. Dysarthria with mild dysphagia 5. recurren UTI's  6. Endometriosis 7. GERD 8. H/o gestational DM w/ G2 9. H/o neurogenic bladder 10. 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 Right shin MRSA abcess requiring I&D (3/06) 12. PE-09/2009-anticoagulated for 7 months with Coumadin (d/c 04/2010) 13. sinus tachycardia 14. Altered mental status 2/2 hypoventilation from anxiety (01/2010, [facility])  Past Surgical History: Reviewed history from 06/21/2010 and no changes required. -Baclofen pump insertion  8/00 -Colposcopy--7/01--LGSIL - 07/08/2000 -C-section x 2:  1997 & 2003 -BTL 2003 -Gallbladder U/S--normal  1/98 -,  -I&D of right LE MRSA abscess - 02/27/2005,  -Laproscopic excision of endometriosis and laproscopic uterosacral nerve ablation (LUNA) procedure 10/2001 -Left Carpal Tunnel Ligament Release 08/2008 by Dr Teressa Senter -Oral surgery for 10 teeth extraction 07/18/09 -EEG 01/2010 by Dr Sharene Skeans: normal, no seizure activities, 6 events that occurred were preceded by hypoventitation  -L wrist surgery by Dr Ileene Rubens, 06/14/10  Family History: Reviewed history from 01/29/2007 and no changes required. No DM, CAD, HTN.  One GM d/o colon CA, the other of breast CA.  Social History: Reviewed history from 05/14/2010 and no changes required. Ailene Ards, who has schizophrenia, MR, is father of daughter.  Homero Fellers and pt are no longer together.  Homero Fellers does not see [name]. Daughter is 3 yo, Stage manager, lives with pt.  65, 35 yo son, lives with pt's parents. Needs assistance with ADL's and IADL's--has personal care services 20 hrs/wk; -Because of [name]'s worsening condition in fall 2005, her son, [name], born in 1997, now lives w/ [name]'s father  Medical sales representative wheelchair.; -No tobacco, EtOH, drugs.; -Patient very concerned about her kids being taken from her; -in aug 06 Ailanie began taking care of 37 year old [name] again  Review of Systems       denies fever, chills, nausea, vomiting, diarrhea or constipation dysuria, CP or shortness of breath   Physical Exam  General:  Well-developed,well-nourished,in no acute distress; alert,appropriate and cooperative throughout  examination. vitals reviewed.  In wheel chair Eyes:  vision grossly intact, pupils equal, pupils round, and pupils reactive to light.   Mouth:  pharyngeal erythema, poor dentition, and teeth missing.   Neck:  supple and full ROM.   Lungs:  Normal respiratory effort, chest expands symmetrically. Lungs are clear to auscultation, no crackles or wheezes. Heart:  normal rate, regular rhythm, no murmur, and no gallop.   Abdomen:  NABS,  Full with firm stool noted throughout. However not tender, no guarding and no rebound noted.  baclofen pump in place. on RLQ Msk:  in contractures some in all extremities still able to function at fairly high level.  Pulses:  R and L radial,dorsalis pedis  are full and equal bilaterally Extremities:  contractures, baseline. Neurologic:  No cranial nerve deficits noted.    Impression & Recommendations:  Problem # 1:  DYSFUNCTIONAL UTERINE BLEEDING (ICD-626.8) Pt has had workup and  the DUB has stopped as of this point and only has some discmfort with cramping at this time.  Pt declined having a pregnancy test even though we felt this is important.  Would stress it again at next visit on thursday.   Last Upreg was 11/2 of this year.  Pt given a shot of tordol at this time for pain and given rx for voltaren to help and take scheduled for the next 5 days.   Pt will have pap and ? of endometrial biopsy but pelvic u/s did not show irregularity.  t scheduled with Dr. Jennette Kettle on thursday.  No red flags at this time and likelihood of pre test probability of cancer extremely low at this time.  Pt given red flags to look out for and side effect of medication given.  Orders: [facility]- Est Level  3 (16109) Ketorolac-Toradol 15mg  (U0454)  Complete Medication List: 1)  Baclofen 10 Mg Tabs (Baclofen) .... Take 1 tablet by mouth four times a day 2)  Toprol Xl 100 Mg Tb24 (Metoprolol succinate) .... Take 1 tablet by mouth once a day 3)  Seroquel 200 Mg Tabs (quetiapine Fumarate)  .... One  tab by mouth qhs 4)  Lamictal 150 Mg Tabs (Lamotrigine) .... Per dr Sharene Skeans 5)  Klonopin 1 Mg Tabs (Clonazepam) .... Per dr hickling 6)  Zanaflex 2 Mg Caps (Tizanidine hcl) .... Per dr Sharene Skeans 7)  Oxybutynin Chloride 5 Mg Tabs (Oxybutynin chloride) .... One tab by mouth three times a day for overactive bladder 8)  Aerochamber Mv Misc (Spacer/aero-holding chambers) .... To use with mdi 9)  Miralax Powd (Polyethylene glycol 3350) .... As needed 10)  Imitrex 50 Mg Tabs (Sumatriptan succinate) .[name] Kitchen.. 1 tab by mouth at onset of headache. can repeat in 2 hours if headache persists.  max 200mg  (4 tabs per day) 11)  Amlodipine Besylate 5 Mg Tabs (Amlodipine besylate) .... Take one tablet by mouth daily 12)  Albuterol Sulfate (2.5 Mg/55ml) 0.083% Nebu (Albuterol sulfate) .... 2.5mg -5mg  inhaled by nebulizer every 4 hours as needed shortness of breath.  dispense 1. 13)  Nebulizer/adult Mask Kit (Respiratory therapy supplies) .... Use as directed with neb machine.  dispense 1 14)  Ibuprofen 600 Mg Tabs (Ibuprofen) .[name] Kitchen.. 1 tab by mouth three times a day as needed pain 15)  Tramadol Hcl 50 Mg Tabs (Tramadol hcl) .[name] Kitchen.. 1 tab by mouth three times a day for pain 16)  Diclofenac Sodium 50 Mg Tbec (Diclofenac sodium) .[name] Kitchen.. 1 tab by mouth two times a day for the next 5 days  Patient Instructions: 1)  Nice to meet you 2)  I am giving you a shot for your cramps.  I think it will help 3)  I will give you a meidince to try take for your cramps.  Starting tomorrow I want you to take 1 pill twice daily for the next 5 days.  If you start having stomach cramps or dark stools I want you to stop this meidince 4)  Come to your appointment on Thursday to be seen.   Prescriptions: DICLOFENAC SODIUM 50 MG TBEC (DICLOFENAC SODIUM) 1 tab by mouth two times a day for the next 5 days  #10 x 0   Entered and Authorized by:   Antoine Primas DO   Signed by:   Antoine Primas DO on 11/06/2010   Method used:   Electronically to         [facility]* (retail)       [address]  [address]       [place], [place]  [address]       Ph: [phone]       Fax: [phone]   RxID:   [id number]    Medication Administration  Injection # 1:    Medication: Ketorolac-Toradol 15mg     Diagnosis: DYSFUNCTIONAL UTERINE BLEEDING (ICD-626.8)    Route: IM    Site: LUOQ gluteus    Exp Date: 05/02/2012    Lot #: [id number]    Mfr: HOSPRIA    Patient tolerated injection without complications    Given by: Jimmy Footman, CMA (November 06, 2010 4:24 PM)  Orders Added: 1)  [facility]- Est Level  3 [99213] 2)  Ketorolac-Toradol 15mg  [V2536]

## 2011-01-03 NOTE — Consult Note (Signed)
Summary: [facility]: Left Wrist  [facility]   Imported By: Clydell Hakim 05/04/2010 10:27:35  _____________________________________________________________________  External Attachment:    Type:   Image     Comment:   External Document

## 2011-01-03 NOTE — Progress Notes (Signed)
Summary: triage   Phone Note Call from Patient Call back at Home Phone [phone]   Caller: Patient Summary of Call: Pt asking to speak to [facility]. Initial call taken by: Clydell Hakim,  July 11, 2010 9:41 AM  Follow-up for Phone Call        LM Follow-up by: Golden Circle RN,  July 11, 2010 9:49 AM  Additional Follow-up for Phone Call Additional follow up Details #1::        she wants a refill on the ibu the surgeon gave her. told her she should call him. states "they take forever" told her she can buy OTC. says she will call the other md who ordered it Additional Follow-up by: Golden Circle RN,  July 11, 2010 11:04 AM

## 2011-01-03 NOTE — Progress Notes (Signed)
Summary: monitor and stress test results   Phone Note Outgoing Call   Call placed by: Meredith Staggers, RN,  February 15, 2010 3:24 PM Call placed to: Patient Summary of Call: left message on pts id vm that stress test was normal and the monitor that she wore showed SR w/occ PVCs and PACs, mean HR 90, episodes of sinus tach w/HR 140, no change in meds

## 2011-01-03 NOTE — Assessment & Plan Note (Signed)
Summary: return of vaginal bleeding/ls   Vital Signs:  Patient profile:   35 year old female Weight:      148 pounds Pulse rate:   79 / minute BP sitting:   113 / 79  (left arm) Cuff size:   regular  Vitals Entered By: Tessie Fass CMA (October 24, 2010 2:02 PM) CC: abnormal bleeding   Primary Care Provider:  CAT TA MD  CC:  abnormal bleeding.  History of Present Illness: 35 y/o F with spastic CP, urinary incontinency, OCD, history of PE who  is here for abnormal uterine bleeding.    She has had 2 abnormal periods in one month: Pt was on Depo for birth control, last injection on  01/2009.  She has not had a period for about 7 months.  Then in October she   Menses x 2 wks.  Saw Dr Denyse Amass on 10/03/10.  Exam was normal.   She was seen in UC where she was Rx Ortho-Novum three times a day x 7 days and percocet for pain.  Pt has been taking it two times a day because she has been forgetting to take it.  She started taking the OCP on Sat and bleeding stopped on Monday.  She continued to take the ocp.  She is concerned about gyn cancer because her mother had cancer requiring hysterectomy when she was pt's age.  Pt also endorses history of endometriosis and having abd cramping.    Last pap 01/2009: negative  Pt is sexually active with 1 new partner recently.     Allergies (verified): No Known Drug Allergies  Past History:  Past Medical History: Last updated: 06/21/2010 1. migraines?--tx`d successfully w/ Imitrex (3/06) and prophylaxic by neuro 2. 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 DJD 4. Dysarthria with mild dysphagia 5. recurren UTI's  6. Endometriosis 7. GERD 8. H/o gestational DM w/ G2 9. H/o neurogenic bladder 10. 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 Right shin MRSA abcess requiring I&D (3/06) 12. PE-09/2009-anticoagulated for 7 months with Coumadin (d/c 04/2010) 13. sinus tachycardia 14. Altered mental status 2/2 hypoventilation from anxiety (01/2010, [facility])  Past Surgical History: Last updated: 06/21/2010 -Baclofen pump insertion  8/00 -Colposcopy--7/01--LGSIL - 07/08/2000 -C-section x 2:  1997 & 2003 -BTL 2003 -Gallbladder U/S--normal  1/98 -,  -I&D of right LE MRSA abscess - 02/27/2005,  -Laproscopic excision of endometriosis and laproscopic uterosacral nerve ablation (LUNA) procedure 10/2001 -Left Carpal Tunnel Ligament Release 08/2008 by Dr Teressa Senter -Oral surgery for 10 teeth extraction 07/18/09 -EEG 01/2010 by Dr Sharene Skeans: normal, no seizure activities, 6 events that occurred were preceded by hypoventitation  -L wrist surgery by Dr Ileene Rubens, 06/14/10  Family History: Last updated: 01/29/2007 No DM, CAD, HTN.  One GM d/o colon CA, the other of breast CA.  Social History: Last updated: 05/14/2010 Ailene Ards, who has schizophrenia, MR, is father of daughter.  Homero Fellers and pt are no longer together.  Homero Fellers does not see [name]. Daughter is 75 yo, Stage manager, lives with pt.  59, 36 yo son, lives with pt's parents. Needs assistance with ADL's and IADL's--has personal care services 20 hrs/wk; -Because of [name]'s worsening condition in fall 2005, her son, [name], born in 1997, now lives w/ [name]'s father  Medical sales representative wheelchair.; -No tobacco, EtOH, drugs.; -Patient very concerned about her kids being taken from her; -in aug 06 Garry began taking care of 37 year old [name] again  Risk Factors: Smoking Status: never (10/03/2010)  Review of Systems  General:  Denies chills, fever, weakness, and weight loss. GU:  Complains of abnormal vaginal bleeding; denies discharge, dysuria, genital sores, and hematuria.  Physical Exam  General:  Well-developed,well-nourished,in no acute distress; alert,appropriate and cooperative throughout examination. vitals reviewed.    Impression & Recommendations:  Problem # 1:  DYSFUNCTIONAL UTERINE BLEEDING (ICD-626.8) Assessment New Pt with history of Depo use (last notation in [facility] was 01/2009),  amenorrhea x 7 months, then bleeding x 2 wks in October.  She was seen in UC on 10/19/10, where she was prescribed OCP (ortho-novum) to stop bleeding.  Bleeding stopped after 2-3 days of ocp.  Pt is concerned for cancerous process.  I intended to do pap today (last one was 01/2009: negative), but we could not use the regular exam table because pt has CP and it was difficult to get her onto regular table.  We wanted to use the Geri exam table, but it was been used and pt could not wait for the room to be available because [facility] transportation was coming to pick her up.  I've scheduled for her to get TVUS, but I do anticipate that the endometrial stripe will be large, given her age.  I have rescheduled pt to be seen on 12/8 in [facility], at which time we can do pap and endometrial bx.  We have blocked this slot for 45 minutes, given that pt has special physical needs and will require more time. For pain, I have given her ultram, which may help with endometriosis.    ***Note: UPT was not done today.  She had negative UPT on 10/19/10 from [place] when she was seen at [facility].    Orders: Ultrasound (Ultrasound) [facility]- Est Level  3 ([address]109)  Complete Medication List: 1)  Baclofen 10 Mg Tabs (Baclofen) .... Take 1 tablet by mouth four times a day 2)  Toprol Xl 100 Mg Tb24 (Metoprolol succinate) .... Take 1 tablet by mouth once a day 3)  Seroquel 200 Mg Tabs (quetiapine Fumarate)  .... One tab by mouth qhs 4)  Lamictal 150 Mg Tabs (Lamotrigine) .... Per dr Sharene Skeans 5)  Klonopin 1 Mg Tabs (Clonazepam) .... Per dr hickling 6)  Zanaflex 2 Mg Caps (Tizanidine hcl) .... Per dr Sharene Skeans 7)  Oxybutynin Chloride 5 Mg Tabs (Oxybutynin chloride) .... One tab by mouth three times a day for overactive bladder 8)  Aerochamber Mv Misc (Spacer/aero-holding chambers) .... To use with mdi 9)  Miralax Powd (Polyethylene glycol 3350) .... As needed 10)  Imitrex 50 Mg Tabs (Sumatriptan succinate) .[name] Kitchen.. 1 tab by mouth at onset of  headache. can repeat in 2 hours if headache persists.  max 200mg  (4 tabs per day) 11)  Amlodipine Besylate 5 Mg Tabs (Amlodipine besylate) .... Take one tablet by mouth daily 12)  Albuterol Sulfate (2.5 Mg/100ml) 0.083% Nebu (Albuterol sulfate) .... 2.5mg -5mg  inhaled by nebulizer every 4 hours as needed shortness of breath.  dispense 1. 13)  Nebulizer/adult Mask Kit (Respiratory therapy supplies) .... Use as directed with neb machine.  dispense 1 14)  Ibuprofen 600 Mg Tabs (Ibuprofen) .[name] Kitchen.. 1 tab by mouth three times a day as needed pain 15)  Tramadol Hcl 50 Mg Tabs (Tramadol hcl) .[name] Kitchen.. 1 tab by mouth three times a day for pain  Patient Instructions: 1)  Please schedule a follow-up appointment on December 8 at 10:00. 2)  We will do pap smear and endometrial biopsy at this appt. 3)  Stop taking birth control pill.   Prescriptions: TRAMADOL HCL 50  MG TABS (TRAMADOL HCL) 1 tab by mouth three times a day for pain  #30 x 1   Entered and Authorized by:   Angeline Slim MD   Signed by:   Angeline Slim MD on 10/24/2010   Method used:   Electronically to        [facility] (retail)       [address]       [place], [place]  [id number]       Ph: [phone]       Fax: [phone]   RxID:   [id number]    Orders Added: 1)  Ultrasound [Ultrasound] 2)  [facility]- Est Level  3 [[address]253]     Prevention & Chronic Care Immunizations   Influenza vaccine: Fluvax 3+  (10/05/2009)   Influenza vaccine due: 11/09/2009    Tetanus booster: 03/18/2008: Tdap [facility])   Tetanus booster due: 03/18/2018    Pneumococcal vaccine: Not documented  Other Screening   Pap smear: NEGATIVE FOR INTRAEPITHELIAL LESIONS OR MALIGNANCY.  (02/22/2009)   Pap smear action/deferral: Deferred-3 yr interval  (04/11/2010)   Pap smear due: 10/03/2007   Smoking status: never  (10/03/2010)  Lipids   Total Cholesterol: 150  (01/29/2007)   LDL: 92  (01/29/2007)   LDL Direct: Not documented   HDL: 41   (01/29/2007)   Triglycerides: 83  (01/29/2007)  Hypertension   Last Blood Pressure: 113 / 79  (10/24/2010)   Serum creatinine: 0.76  (09/17/2010)   Serum potassium 4.4  (09/17/2010)  Self-Management Support :    Hypertension self-management support: Not documented

## 2011-01-03 NOTE — Miscellaneous (Signed)
Summary: L arm pain   Clinical Lists Changes she called to ask for an xray of L arm. stated she thinks she tore something. told her she will need an appt. md will not just order an xray. she said " never mind, I am having surgery on that arm on the 28th". instructed her to call her orthopedic surgeon about this new pain. states she will.[name] KitchenMarland KitchenGolden Circle RN  May 16, 2010 11:01 AM  states she did not get the foot xray. they told her they never got the order. told her I faxed it yesterday am. assured her i will fax it again. says she cannot get back to [facility] until Friday. told her ok. wanted to know when she will get the results. told her most likely monday. she was ok with this.Golden Circle RN  May 16, 2010 3:04 PM      Thank you.  Sanye Ledesma MD  May 16, 2010 5:18 PM

## 2011-01-03 NOTE — Progress Notes (Signed)
Summary: pls call asap   Phone Note Call from Patient Call back at Home Phone [phone]   Caller: Patient Summary of Call: pt is asking to speak Initial call taken by: De Nurse,  September 10, 2010 10:46 AM  Follow-up for Phone Call        spoke with Aram Beecham, her caregiver. has a sore on L leg. not open yet. spoke with pt. she will go to UC. she is afraid that it will open & thinks it needs immediate attention Follow-up by: Golden Circle RN,  September 10, 2010 11:03 AM

## 2011-01-03 NOTE — Assessment & Plan Note (Signed)
Summary: surgery L wrist   Vital Signs:  Patient profile:   35 year old female Height:      59 inches Weight:      158 pounds BMI:     32.03 O2 Sat:      93 % Temp:     98.4 degrees F oral Pulse rate:   90 / minute BP sitting:   117 / 74  (left arm) Cuff size:   regular  Vitals Entered By: Jimmy Footman, CMA (June 21, 2010 2:58 PM) CC: s/p L wrist surgery   Primary Care Provider:  Merlon Alcorta MD  CC:  s/p L wrist surgery.  History of Present Illness: 35 y/o F with spastic cp and multiple medical issues who presents  3 days s/p L wrist surgery performed by Dr Dierdre Searles at [facility].  Pt is taking vicodin for pain.  She has not been seen for f/u by Dr Dierdre Searles.  She would like for me to remove the wrapping around her incision site.  Reading her discharge instructions from [facility]:  Appt with Dr Dierdre Searles on 8/3, 8/26.  Pt was to have wrap removed 5 days after discharge, at which time she can shower.  Pt is to call [facility] to schedule f/u appt 10-14 days after discharge for suture removal.    Pt is very insistant that I remove her dressing.  She denies pain or irritation.  She states that part of the dressing is falling off and she would like all of it off.  I called [facility] but was not able to speak to Dr Dierdre Searles or his PAs.  I was told that pt has cancelled f/u appts with them.  I told pt that I am not comfortable removing the dressing, esp since it is not 5 days from discharge, per their instructions.  I also think that it is important that pt is seen by [facility] for f/u.    Current Medications (verified): 1)  Baclofen 10 Mg Tabs (Baclofen) .... Take 1 Tablet By Mouth Four Times A Day 2)  Toprol Xl 100 Mg Tb24 (Metoprolol Succinate) .... Take 1 Tablet By Mouth Once A Day 3)  Seroquel 200 Mg  Tabs (Quetiapine Fumarate) .... One Tab By Mouth Qhs 4)  Effexor Xr 150 Mg Cp24 (Venlafaxine Hcl) .... One Tab By Mouth Qday Per Psych 5)  Depo-Provera 150 Mg/ml Im Susp (Medroxyprogesterone Acetate) .... Q63m 6)  Lamictal 150 Mg  Tabs (Lamotrigine) .... Per Dr Sharene Skeans 7)  Klonopin 1 Mg Tabs (Clonazepam) .... Per Dr Sharene Skeans 8)  Zanaflex 2 Mg Caps (Tizanidine Hcl) .... Per Dr Sharene Skeans 9)  Oxybutynin Chloride 5 Mg Tabs (Oxybutynin Chloride) .... One Tab By Mouth Three Times A Day For Overactive Bladder 10)  Aerochamber Mv  Misc (Spacer/aero-Holding Chambers) .... To Use With Mdi 11)  Fluticasone Propionate 0.05 % Crea (Fluticasone Propionate) .... As Needed 12)  Restoril 22.5 Mg Caps (Temazepam) .... At Bedtime 13)  Miralax  Powd (Polyethylene Glycol 3350) .... As Needed 14)  Imitrex 50 Mg Tabs (Sumatriptan Succinate) .... As Needed 15)  Amlodipine Besylate 5 Mg Tabs (Amlodipine Besylate) .... Take One Tablet By Mouth Daily 16)  Albuterol Sulfate (2.5 Mg/97ml) 0.083% Nebu (Albuterol Sulfate) .... 2.5mg -5mg  Inhaled By Nebulizer Every 4 Hours As Needed Shortness of Breath.  Dispense 1. 17)  Nebulizer/adult Mask  Kit (Respiratory Therapy Supplies) .... Use As Directed With [employer].  Dispense 1 18)  Bacitracin 500 Unit/gm Oint (Bacitracin) .... Apply To Wounds Two Times A  Day As Needed To Prevent Infection.  Large Op 19)  Vicodin Hp 10-660 Mg Tabs (Hydrocodone-Acetaminophen) .... Generic Please Sig 1 Tab By Mouth Q 4-6 Hrs As Needed Pain  Allergies (verified): No Known Drug Allergies  Past History:  Past Medical History: 1. migraines?--tx`d successfully w/ Imitrex (3/06) and prophylaxic by neuro 2. 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 DJD 4. Dysarthria with mild dysphagia 5. recurren UTI's  6. Endometriosis 7. GERD 8. H/o gestational DM w/ G2 9. H/o neurogenic bladder 10. 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 Right shin MRSA abcess requiring I&D (3/06) 12. PE-09/2009-anticoagulated for 7 months with Coumadin (d/c 04/2010) 13. sinus tachycardia 14. Altered mental status 2/2 hypoventilation from anxiety (01/2010, [facility])  Past Surgical History: -Baclofen pump insertion   8/00 -Colposcopy--7/01--LGSIL - 07/08/2000 -C-section x 2:  1997 & 2003 -BTL 2003 -Gallbladder U/S--normal  1/98 -,  -I&D of right LE MRSA abscess - 02/27/2005,  -Laproscopic excision of endometriosis and laproscopic uterosacral nerve ablation (LUNA) procedure 10/2001 -Left Carpal Tunnel Ligament Release 08/2008 by Dr Teressa Senter -Oral surgery for 10 teeth extraction 07/18/09 -EEG 01/2010 by Dr Sharene Skeans: normal, no seizure activities, 6 events that occurred were preceded by hypoventitation  -L wrist surgery by Dr Ileene Rubens, 06/14/10  Physical Exam  General:  Well-developed,well-nourished,in no acute distress; alert,appropriate and cooperative throughout examination Extremities:  Left wrist wrapped in clean dressing.  Dressing not removed, therefore sutures and incision site not examined.     Impression & Recommendations:  Problem # 1:  WRIST PAIN, LEFT (EAV-409.81) Assessment Unchanged S/p surgery by Dr Dierdre Searles at [facility].  I did not remove dressing.  Advised pt to make appt with [facility] for f/u.  Orders: [facility]- Est Level  3 (19147)  Complete Medication List: 1)  Baclofen 10 Mg Tabs (Baclofen) .... Take 1 tablet by mouth four times a day 2)  Toprol Xl 100 Mg Tb24 (Metoprolol succinate) .... Take 1 tablet by mouth once a day 3)  Seroquel 200 Mg Tabs (quetiapine Fumarate)  .... One tab by mouth qhs 4)  Effexor Xr 150 Mg Cp24 (Venlafaxine hcl) .... One tab by mouth qday per psych 5)  Depo-provera 150 Mg/ml Im Susp (Medroxyprogesterone acetate) .... Q38m 6)  Lamictal 150 Mg Tabs (Lamotrigine) .... Per dr Sharene Skeans 7)  Klonopin 1 Mg Tabs (Clonazepam) .... Per dr hickling 8)  Zanaflex 2 Mg Caps (Tizanidine hcl) .... Per dr Sharene Skeans 9)  Oxybutynin Chloride 5 Mg Tabs (Oxybutynin chloride) .... One tab by mouth three times a day for overactive bladder 10)  Aerochamber Mv Misc (Spacer/aero-holding chambers) .... To use with mdi 11)  Fluticasone Propionate 0.05 % Crea (Fluticasone propionate) .... As  needed 12)  Restoril 22.5 Mg Caps (Temazepam) .... At bedtime 13)  Miralax Powd (Polyethylene glycol 3350) .... As needed 14)  Imitrex 50 Mg Tabs (Sumatriptan succinate) .... As needed 15)  Amlodipine Besylate 5 Mg Tabs (Amlodipine besylate) .... Take one tablet by mouth daily 16)  Albuterol Sulfate (2.5 Mg/40ml) 0.083% Nebu (Albuterol sulfate) .... 2.5mg -5mg  inhaled by nebulizer every 4 hours as needed shortness of breath.  dispense 1. 17)  Nebulizer/adult Mask Kit (Respiratory therapy supplies) .... Use as directed with neb machine.  dispense 1 18)  Bacitracin 500 Unit/gm Oint (Bacitracin) .... Apply to wounds two times a day as needed to prevent infection.  large op 19)  Vicodin Hp 10-660 Mg Tabs (Hydrocodone-acetaminophen) .... Generic please sig 1 tab by mouth q 4-6 hrs as needed pain

## 2011-01-03 NOTE — Progress Notes (Signed)
Summary: triage   Phone Note Call from Patient Call back at Home Phone [phone]   Caller: Patient Summary of Call: Asking Kennon Rounds to call her. Initial call taken by: Clydell Hakim,  August 09, 2010 8:34 AM  Follow-up for Phone Call        states she went to [facility] & they gave her a shot which helped her HA. the HA is back now & she is in pain. work in at 1:30. she is aware of wait  LM at [facility] medical records asking that their record of yesterday's visit be faxed here before 1:30 tday Follow-up by: Golden Circle RN,  August 09, 2010 9:36 AM

## 2011-01-03 NOTE — Progress Notes (Signed)
   Phone Note Call from Patient   Caller: Patient Call For: [phone] Summary of Call: Ms. Gelles calling to let Dr. Janalyn Harder know she has started her period after ten months and is having a lot of pain.  Ibuprophen is not working.  Want to have something else prescribed for pain.  Please call with info Initial call taken by: Abundio Miu,  October 02, 2010 11:26 AM  Follow-up for Phone Call        Pt would like you to call her back Follow-up by: Abundio Miu,  October 02, 2010 3:19 PM  Additional Follow-up for Phone Call Additional follow up Details #1::        Called pt back.  Having menses after 10 months.  She is having abd cramping that is very uncomfortable.  BMs normal.  No chest pain, or dyspnea.  Told her that it is not appropriate to prescribe narcotic for menstrual pain.  She can alternate ibuprofen/tylenol or trying aleve for cramping.  Pt agreed.   Additional Follow-up by: Cat Ta MD,  October 02, 2010 3:51 PM    She needs to been seeen if she wants more meds than ibuprofen or tylenol.  I will not Rx pain meds without seeing pt.  Pain meds are not appropriate for menstrual pain.  Cat Ta MD  October 02, 2010 12:06 PM

## 2011-01-03 NOTE — Assessment & Plan Note (Signed)
Summary: Cardiology Nuclear Study  Nuclear Med Background Indications for Stress Test: Evaluation for Ischemia   History: Echo  History Comments: 10/07 Echo: EF= 65%  Symptoms: Chest Pain, Dizziness, Syncope    Nuclear Pre-Procedure Cardiac Risk Factors: Hypertension Caffeine/Decaff Intake: 5:45 am sip of tea NPO After: 6:00 AM Lungs: clear IV 0.9% NS with Angio Cath: 22g     IV Site: (L) forearm IV Started by: Stanton Kidney EMT-P Chest Size (in) 36     Cup Size B     Height (in): 59 Weight (lb): 161 BMI: 32.64 Tech Comments: The patient brought her wrist automatic cuff BP=148/86(R), manual BP 108/70. Patsy Edwards,RN.   Nuclear Med Study 1 or 2 day study:  1 day     Stress Test Type:  Eugenie Birks Reading MD:  Arvilla Meres, MD     Referring MD:  D.Bensimhon Resting Radionuclide:  Technetium 24m Tetrofosmin     Resting Radionuclide Dose:  11.0 mCi  Stress Radionuclide:  Technetium 18m Tetrofosmin     Stress Radionuclide Dose:  32.0 mCi   Stress Protocol   Lexiscan: 0.4 mg   Stress Test Technologist:  Milana Na EMT-P     Nuclear Technologist:  Burna Mortimer Deal RT-N  Rest Procedure  Myocardial perfusion imaging was performed at rest 45 minutes following the intravenous administration of Myoview Technetium 69m Tetrofosmin.  Stress Procedure  The patient received IV Lexiscan 0.4 mg over 15-seconds.  Myoview injected at 30-seconds.  There were no significant changes with infusion.  Quantitative spect images were obtained after a 45 minute delay.  QPS Raw Data Images:  Normal; no motion artifact; normal heart/lung ratio. Stress Images:  There is normal uptake in all areas. Rest Images:  Normal homogeneous uptake in all areas of the myocardium. Subtraction (SDS):  Normal Transient Ischemic Dilatation:  .93  (Normal <1.22)  Lung/Heart Ratio:  .29  (Normal <0.45)  Quantitative Gated Spect Images QGS EDV:  66 ml QGS ESV:  12 ml QGS EF:  82 % QGS cine images:   Normal  Findings Normal nuclear study      Overall Impression  Exercise Capacity: Lexiscan study with no exercise. ECG Impression: Baseline: NSR; No significant ST segment change with Lexiscan. Overall Impression: Normal stress nuclear study.  Appended Document: Cardiology Nuclear Study normal  Appended Document: Cardiology Nuclear Study left message on ID voice mail

## 2011-01-03 NOTE — Assessment & Plan Note (Signed)
Summary: HA/York/ta   Vital Signs:  Patient profile:   35 year old female Weight:      153.2 pounds Temp:     98.1 degrees F oral Pulse rate:   103 / minute Pulse rhythm:   regular BP sitting:   127 / 84  (left arm) Cuff size:   regular  Vitals Entered By: Loralee Pacas CMA (August 09, 2010 3:04 PM) CC: headache x 4 days Comments headache x 4 days nausea and vomiting, however she did vomit saturday for no reason.  has not had anything to eat since this morning  red bumps on face and back since monday.   Primary Care Lataya Varnell:  CAT TA MD  CC:  headache x 4 days.  History of Present Illness: 35 y/o F here for   HEADACHE  Onset: Sunday (4 days ago).  Woke up on Sunday and was fine.  Pain came throughout the day.  She went to UC yesterday and was gien 0.5mg  IM Dilaudid.  That relieved her HA, but she woke up with HA again.  UC recommended that she f/u with Dr Sharene Skeans today but he is not in the clinic.  Pt states that she cannot see him unitl 1-2 wks from now.  She also states that Dr Sharene Skeans will call her next Monday.  Location: all over head  Description :throbbing Hydrocodone helped HA for only 3-4 hours.   Duration: "it won't really go away"  Symptoms Nausea/vomiting: +nausea, no vomiting. But she had one episode of vomiting on Sat, she denied feeling sick when she vomiting.   Photophobia: yes Phonophobia: yes Tearing of eyes: no Sinus pain/pressure: no Relation to menstrual cycle: she has been on Depo for a while, but has not had any injections lately.  She has been amenorrheic for 7 months.  She feels that a period is coming on though.   She has a history of migraine but stopped taking a while ato  Red Flags Fever: no Neck pain/stiffness: no Vision/speech difficulty: no Focal weakness or numbness: no Altered mental status: no Trauma: no Worse in am: woke up with HA today  Anticoagulant use: no   Family history of Migraine: mother  RASH: Pt c/o rash that her  CNA first noticed on her back on Monday (3 days ago).  Rash has spreadk to shoulders, arms, chest.  Pt denies new soaps/detergent/lotion/makeup or sharing towels.  Rash is mildly pruritic.  No rash on abd or legs.  Denies fever, chills.       Habits & Providers  Alcohol-Tobacco-Diet     Tobacco Status: never  Current Medications (verified): 1)  Baclofen 10 Mg Tabs (Baclofen) .... Take 1 Tablet By Mouth Four Times A Day 2)  Toprol Xl 100 Mg Tb24 (Metoprolol Succinate) .... Take 1 Tablet By Mouth Once A Day 3)  Seroquel 200 Mg  Tabs (Quetiapine Fumarate) .... One Tab By Mouth Qhs 4)  Effexor Xr 150 Mg Cp24 (Venlafaxine Hcl) .... One Tab By Mouth Qday Per Psych 5)  Depo-Provera 150 Mg/ml Im Susp (Medroxyprogesterone Acetate) .... Q32m 6)  Lamictal 150 Mg Tabs (Lamotrigine) .... Per Dr Sharene Skeans 7)  Klonopin 1 Mg Tabs (Clonazepam) .... Per Dr Sharene Skeans 8)  Zanaflex 2 Mg Caps (Tizanidine Hcl) .... Per Dr Sharene Skeans 9)  Oxybutynin Chloride 5 Mg Tabs (Oxybutynin Chloride) .... One Tab By Mouth Three Times A Day For Overactive Bladder 10)  Aerochamber Mv  Misc (Spacer/aero-Holding Chambers) .... To Use With Mdi 11)  Fluticasone Propionate  0.05 % Crea (Fluticasone Propionate) .... As Needed 12)  Restoril 22.5 Mg Caps (Temazepam) .... At Bedtime 13)  Miralax  Powd (Polyethylene Glycol 3350) .... As Needed 14)  Imitrex 50 Mg Tabs (Sumatriptan Succinate) .[name] Kitchen.. 1 Tab By Mouth At Onset of Headache. Can Repeat in 2 Hours If Headache Persists.  Max 200mg  (4 Tabs Per Day) 15)  Amlodipine Besylate 5 Mg Tabs (Amlodipine Besylate) .... Take One Tablet By Mouth Daily 16)  Albuterol Sulfate (2.5 Mg/68ml) 0.083% Nebu (Albuterol Sulfate) .... 2.5mg -5mg  Inhaled By Nebulizer Every 4 Hours As Needed Shortness of Breath.  Dispense 1. 17)  Nebulizer/adult Mask  Kit (Respiratory Therapy Supplies) .... Use As Directed With [employer].  Dispense 1 18)  Bacitracin 500 Unit/gm Oint (Bacitracin) .... Apply To Wounds Two Times A  Day As Needed To Prevent Infection.  Large Op 19)  Vicodin Hp 10-660 Mg Tabs (Hydrocodone-Acetaminophen) .... Take 1 Tab Daily As Needed Severe Pain 20)  Ibuprofen 600 Mg Tabs (Ibuprofen) .[name] Kitchen.. 1 Tab By Mouth Three Times A Day As Needed Pain 21)  Salicylic Acid 6 % Crea (Salicylic Acid) .... Apply To Affected Areas At Bedtime. Rinse Off in Morning.  Dispense 30 Grams. 22)  Zyrtec Allergy 10 Mg Tabs (Cetirizine Hcl) .[name] Kitchen.. 1 Tab By Mouth Daily For Itchiness  Allergies (verified): No Known Drug Allergies  Past History:  Past Medical History: Last updated: 06/21/2010 1. migraines?--tx`d successfully w/ Imitrex (3/06) and prophylaxic by neuro 2. 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 DJD 4. Dysarthria with mild dysphagia 5. recurren UTI's  6. Endometriosis 7. GERD 8. H/o gestational DM w/ G2 9. H/o neurogenic bladder 10. 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 Right shin MRSA abcess requiring I&D (3/06) 12. PE-09/2009-anticoagulated for 7 months with Coumadin (d/c 04/2010) 13. sinus tachycardia 14. Altered mental status 2/2 hypoventilation from anxiety (01/2010, [facility])  Past Surgical History: Last updated: 06/21/2010 -Baclofen pump insertion  8/00 -Colposcopy--7/01--LGSIL - 07/08/2000 -C-section x 2:  1997 & 2003 -BTL 2003 -Gallbladder U/S--normal  1/98 -,  -I&D of right LE MRSA abscess - 02/27/2005,  -Laproscopic excision of endometriosis and laproscopic uterosacral nerve ablation (LUNA) procedure 10/2001 -Left Carpal Tunnel Ligament Release 08/2008 by Dr Teressa Senter -Oral surgery for 10 teeth extraction 07/18/09 -EEG 01/2010 by Dr Sharene Skeans: normal, no seizure activities, 6 events that occurred were preceded by hypoventitation  -L wrist surgery by Dr Ileene Rubens, 06/14/10  Family History: Last updated: 01/29/2007 No DM, CAD, HTN.  One GM d/o colon CA, the other of breast CA.  Social History: Last updated: 05/14/2010 Ailene Ards, who has schizophrenia, MR, is father of  daughter.  Homero Fellers and pt are no longer together.  Homero Fellers does not see [name]. Daughter is 37 yo, Stage manager, lives with pt.  66, 78 yo son, lives with pt's parents. Needs assistance with ADL's and IADL's--has personal care services 20 hrs/wk; -Because of [name]'s worsening condition in fall 2005, her son, [name], born in 1997, now lives w/ [name]'s father  Medical sales representative wheelchair.; -No tobacco, EtOH, drugs.; -Patient very concerned about her kids being taken from her; -in aug 06 Maite began taking care of 37 year old [name] again  Risk Factors: Smoking Status: never (08/09/2010)  Review of Systems General:  Denies chills and fever. Derm:  Complains of itching and rash; denies changes in color of skin, changes in nail beds, dryness, excessive perspiration, flushing, hair loss, insect bite(s), lesion(s), and poor wound healing. Neuro:  Complains of headaches; denies brief paralysis, difficulty with  concentration, disturbances in coordination, falling down, inability to speak, memory loss, numbness, poor balance, seizures, sensation of room spinning, tingling, tremors, visual disturbances, and weakness.  Physical Exam  General:  Well-developed,well-nourished,in no acute distress; alert,appropriate and cooperative throughout examination. vitals reviewed.  Head:  normocephalic and atraumatic.   Eyes:  No corneal or conjunctival inflammation noted. EOMI. Perrla. Funduscopic exam benign, without hemorrhages, exudates or papilledema. Vision grossly normal. Neurologic:  No cranial nerve deficits noted. Station and gait are normal. Plantar reflexes are down-going bilaterally. DTRs are symmetrical throughout. Sensory, motor and coordinative functions appear intact. Skin:  Back, shoudler, chest : mulitple 1-53mm red and fleshy papules spread in irregular pattern on back.  Fleshy papules with center ulceration.  Some look vesicular.  no purpura and no edema.   Cervical Nodes:  No lymphadenopathy noted Axillary  Nodes:  No palpable lymphadenopathy   Impression & Recommendations:  Problem # 1:  HEADACHE (ICD-784.0) Assessment New Features of migraine.  Pt has history of migraine that resolved with Imitrex, but pt has not taken in long time.  Will refill Imitrex.  Pt is to f/u with Dr Sharene Skeans.  Her updated medication list for this problem includes:    Toprol Xl 100 Mg Tb24 (Metoprolol succinate) .[name] Kitchen... Take 1 tablet by mouth once a day    Imitrex 50 Mg Tabs (Sumatriptan succinate) .[name] Kitchen... 1 tab by mouth at onset of headache. can repeat in 2 hours if headache persists.  max 200mg  (4 tabs per day)    Vicodin Hp 10-660 Mg Tabs (Hydrocodone-acetaminophen) .[name] Kitchen... Take 1 tab daily as needed severe pain    Ibuprofen 600 Mg Tabs (Ibuprofen) .[name] Kitchen... 1 tab by mouth three times a day as needed pain  Orders: Ketorolac-Toradol 15mg  (J1885) [facility]- Est  Level 4 (47829)  Problem # 2:  SKIN RASH (ICD-782.1) Assessment: New Rash likely impetigo.  Will treat with salicylic acid and antihistamine for itchiness.   Her updated medication list for this problem includes:    Fluticasone Propionate 0.05 % Crea (Fluticasone propionate) .[name] Kitchen... As needed  Orders: [facility]- Est  Level 4 (56213)  Complete Medication List: 1)  Baclofen 10 Mg Tabs (Baclofen) .... Take 1 tablet by mouth four times a day 2)  Toprol Xl 100 Mg Tb24 (Metoprolol succinate) .... Take 1 tablet by mouth once a day 3)  Seroquel 200 Mg Tabs (quetiapine Fumarate)  .... One tab by mouth qhs 4)  Effexor Xr 150 Mg Cp24 (Venlafaxine hcl) .... One tab by mouth qday per psych 5)  Depo-provera 150 Mg/ml Im Susp (Medroxyprogesterone acetate) .... Q57m 6)  Lamictal 150 Mg Tabs (Lamotrigine) .... Per dr Sharene Skeans 7)  Klonopin 1 Mg Tabs (Clonazepam) .... Per dr hickling 8)  Zanaflex 2 Mg Caps (Tizanidine hcl) .... Per dr Sharene Skeans 9)  Oxybutynin Chloride 5 Mg Tabs (Oxybutynin chloride) .... One tab by mouth three times a day for overactive bladder 10)  Aerochamber Mv Misc  (Spacer/aero-holding chambers) .... To use with mdi 11)  Fluticasone Propionate 0.05 % Crea (Fluticasone propionate) .... As needed 12)  Restoril 22.5 Mg Caps (Temazepam) .... At bedtime 13)  Miralax Powd (Polyethylene glycol 3350) .... As needed 14)  Imitrex 50 Mg Tabs (Sumatriptan succinate) .[name] Kitchen.. 1 tab by mouth at onset of headache. can repeat in 2 hours if headache persists.  max 200mg  (4 tabs per day) 15)  Amlodipine Besylate 5 Mg Tabs (Amlodipine besylate) .... Take one tablet by mouth daily 16)  Albuterol Sulfate (2.5 Mg/52ml) 0.083% Nebu (Albuterol sulfate) .[name] KitchenMarland KitchenMarland Kitchen  2.5mg -5mg  inhaled by nebulizer every 4 hours as needed shortness of breath.  dispense 1. 17)  Nebulizer/adult Mask Kit (Respiratory therapy supplies) .... Use as directed with neb machine.  dispense 1 18)  Bacitracin 500 Unit/gm Oint (Bacitracin) .... Apply to wounds two times a day as needed to prevent infection.  large op 19)  Vicodin Hp 10-660 Mg Tabs (Hydrocodone-acetaminophen) .... Take 1 tab daily as needed severe pain 20)  Ibuprofen 600 Mg Tabs (Ibuprofen) .[name] Kitchen.. 1 tab by mouth three times a day as needed pain 21)  Salicylic Acid 6 % Crea (Salicylic acid) .... Apply to affected areas at bedtime. rinse off in morning.  dispense 30 grams. 22)  Zyrtec Allergy 10 Mg Tabs (Cetirizine hcl) .[name] Kitchen.. 1 tab by mouth daily for itchiness  Patient Instructions: 1)  Please schedule an appointment with your primary doctor as needed  2)  Call for appointment with Dr Sharene Skeans for headache. 3)  For rash: apply salicylic acid at bedtime 4)  Take zyrtec daily for itchiness.   Prescriptions: ZYRTEC ALLERGY 10 MG TABS (CETIRIZINE HCL) 1 tab by mouth daily for itchiness  #30 x 1   Entered and Authorized by:   Angeline Slim MD   Signed by:   Angeline Slim MD on 08/09/2010   Method used:   Electronically to        [facility] (retail)       [address]       [place], [place]  [address]       Ph: [phone]       Fax: [phone]    RxID:   [id number] SALICYLIC ACID 6 % CREA (SALICYLIC ACID) Apply to affected areas at bedtime. Rinse off in morning.  Dispense 30 grams.  #1 x 1   Entered and Authorized by:   Angeline Slim MD   Signed by:   Angeline Slim MD on 08/09/2010   Method used:   Electronically to        [facility]* (retail)       [address]       [place], [place]  [id number]       Ph: [phone]       Fax: [phone]   RxID:   [id number] IMITREX 50 MG TABS (SUMATRIPTAN SUCCINATE) 1 tab by mouth at onset of headache. Can repeat in 2 hours if headache persists.  Max 200mg  (4 tabs per day)  #20 x 3   Entered and Authorized by:   Angeline Slim MD   Signed by:   Angeline Slim MD on 08/09/2010   Method used:   Electronically to        [facility] (retail)       [address]       [place], [place]  [address]       Ph: [phone]       Fax: [phone]   RxID:   [id number]    Medication Administration  Injection # 1:    Medication: Ketorolac-Toradol 15mg     Diagnosis: HEADACHE (ICD-784.0)    Route: IM    Site: RUOQ gluteus    Exp Date: 10/03/2011    Lot #: [id number]    Mfr: Hospira    Comments: toradol 30 mg given IM    Patient tolerated injection without complications    Given by: Theresia Lo RN (August 09, 2010 5:09 PM)  Orders Added: 1)  Ketorolac-Toradol 15mg  [J1885] 2)  [facility]- Est  Level 4 [81191]

## 2011-01-03 NOTE — Letter (Signed)
Summary: Out of School  [facility]  [address]   [place], [place] [address]   Phone: [phone]  Fax: [phone]    December 18, 2010   Student:  [employer]    To Whom It May Concern:   For Medical reasons, please excuse the above named student from school for the following dates:  Start:   December 18, 2010  End:    December 20, 2010  If you need additional information, please feel free to contact our office.   Sincerely,    Milinda Antis MD    ****This is a legal document and cannot be tampered with.  Schools are authorized to verify all information and to do so accordingly.

## 2011-01-03 NOTE — Letter (Signed)
Summary: *Referral Letter: Dr Vida Roller [facility]  [address]   [place], [place] [address]   Phone: [phone]  Fax: [phone]    04/04/2010  Thank you in advance for agreeing to see my patient:  Tiann Saha [address] [place], [place]  [address]  Phone: [phone]  Dear Dr. Teressa Senter:  I am writing to you in regards to a mutual patient, Ms. Lizzett Nobile.  Ms. Hasler is 35 years old and has spastic cerebral palsy.  In the fall of 2009 you performed a Left carpal tunnel release on Ms. Tooker.  This procedure gave her some relief of pain.  As you may remember, Ms. Bagent has only the use of her left hand to operate her motorized chair.  Since then she has had intermitten pain that has failed medical management, including steroid injections, vicodin, and physical therapy.  Most recently Left Wrist xrays were done which showed the reading below.     There is widening of the scapholunate distance which may   indicate scapholunate ligamentous injury.  There is some      loss of radiocarpal joint space suggesting early degenerative      change.  There also a lucency involving the triquetrum which    could represent erosion or possibly be due to trauma.  On the      lateral view there is suggestion of a small bony density        posteriorly which could represent a triquetral fracture fragment.     There is overlying soft tissue swelling present.  These images are available at [facility].  I am requesting your expertise in helping to relieve Ms. Wycoff's pain.    Current Medications: 1)  BACLOFEN 10 MG TABS (BACLOFEN) Take 1 tablet by mouth four times a day 2)  TOPROL XL 100 MG TB24 (METOPROLOL SUCCINATE) Take 1 tablet by mouth once a day 3)  * SEROQUEL 200 MG  TABS (QUETIAPINE FUMARATE) one tab by mouth qhs 4)  EFFEXOR XR 150 MG CP24 (VENLAFAXINE HCL) one tab by mouth qday per psych 5)  DEPO-PROVERA 150 MG/ML IM SUSP (MEDROXYPROGESTERONE  ACETATE) q11m 6)  HYDROCODONE-ACETAMINOPHEN 5-325 MG TABS (HYDROCODONE-ACETAMINOPHEN) one tab by mouth q 4 hrs as needed pan 7)  LAMICTAL 150 MG TABS (LAMOTRIGINE) per Dr Sharene Skeans 8)  KLONOPIN 1 MG TABS (CLONAZEPAM) per Dr Sharene Skeans 9)  ZANAFLEX 2 MG CAPS (TIZANIDINE HCL) Per Dr Sharene Skeans 10)  COUMADIN 5 MG TABS (WARFARIN SODIUM) Take as directed 11)  OXYBUTYNIN CHLORIDE 5 MG TABS (OXYBUTYNIN CHLORIDE) one tab by mouth three times a day for overactive bladder 12)  AEROCHAMBER MV  MISC (SPACER/AERO-HOLDING CHAMBERS) to use with MDI 13)  IBUPROFEN 600 MG TABS (IBUPROFEN) 1 tab by mouth two times a day as needed pain 14)  FLUTICASONE PROPIONATE 0.05 % CREA (FLUTICASONE PROPIONATE) as needed 15)  RESTORIL 22.5 MG CAPS (TEMAZEPAM) at bedtime 16)  MIRALAX  POWD (POLYETHYLENE GLYCOL 3350) as needed 17)  IMITREX 50 MG TABS (SUMATRIPTAN SUCCINATE) as needed 18)  AMLODIPINE BESYLATE 5 MG TABS (AMLODIPINE BESYLATE) Take one tablet by mouth daily   Past Medical History: 1)  1. ?migraines?--tx`d successfully w/ Imitrex (3/06) and prophylaxic by neuro 2)  2. 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 DJD 4)  4. Dysarthria with mild dysphagia 5)  5. recurren UTI's  6)  6. Endometriosis 7)  7. GERD 8)  8. H/o gestational DM w/ G2 9)  9. H/o neurogenic bladder 10)  10. LGSIL 10/00, 6/01, 1/02 11)  11. Right shin MRSA abcess requiring I&D (3/06) 12)  12. PE-09/2009-anticoagulated 13)  13. sinus tachycardia    Pertinent Labs:    Thank you again for agreeing to see our patient; please contact us if you have any further questions or need additional information.  Sincerely,  Derita Michelsen MD

## 2011-01-03 NOTE — Letter (Signed)
Summary: COLPO / ENDO BX Letter  [facility]  [address]   [place], [place] [address]   Phone: [phone]  Fax: [phone]    11/15/2010  [name] [name] [address] [place], [place]  [address]  Dear Ms. Bocanegra,  Your pap smear was normal. Your endometrial biopsy was ALSO NORMAL--totally. No wory for any cancer.  I have set you up to see to see the gynecologist as you requested. have a Happy Holiday!         Sincerely,   Denny Levy MD  Appended Document: COLPO / ENDO BX Letter mailed

## 2011-01-03 NOTE — Progress Notes (Signed)
Summary: phn msg   Phone Note Call from Patient Call back at Home Phone [phone]   Caller: Patient Summary of Call: needs to talk Initial call taken by: De Nurse,  May 25, 2010 9:33 AM  Follow-up for Phone Call        LM Follow-up by: Golden Circle RN,  May 25, 2010 9:41 AM  Additional Follow-up for Phone Call Additional follow up Details #1::        LM Additional Follow-up by: Golden Circle RN,  May 25, 2010 2:48 PM

## 2011-01-03 NOTE — Progress Notes (Signed)
Summary: elevated BP   Phone Note Call from Patient   Summary of Call: pt called b/c her BP is elevated, she states it has been running high and neuro started her on a new med but she doesn't know what it is, she states she is still taking met. 100, she is sch to see Dr [name] 3/2.  Pt states her BP yest was 178/117 today it was 169/118 at 4:30 and later it was 160/110, she reports she takes her meds at night, advised pt to take metoprolol now and check BP later this afternoon, keep appt tom w/DB Meredith Staggers, RN  January 30, 2010 12:22 PM

## 2011-01-03 NOTE — Assessment & Plan Note (Signed)
Summary: cp "like when I had PE"/Industry/Ta   Vital Signs:  Patient profile:   35 year old female Height:      59 inches Temp:     97.3 degrees F oral Pulse rate:   83 / minute BP sitting:   130 / 93  Vitals Entered By: Tessie Fass CMA (December 20, 2009 11:37 AM) CC: sharp epigatric and chest pain   Primary Care Provider:  CAT TA MD  CC:  sharp epigatric and chest pain.  History of Present Illness: 1) Chest pain: Patient was here for INR check this morning. Complaint of sharp epigastric pain that feels similar to her pain when she had bilateral pulmonary emboli in September 2010. Pain started at 9:30 AM and is constant, worse with palpation, not relieved by rest. Denies pleuritic pain, exertional pain, tachycardia, dyspnea different from baseline, trauma, leg pain or swelling ,fever, chills, cough. Of note, patient is here to have INR check - INR was 1.8, last INR on 12/06/09 was 1.8- patient states that she became very anxious after seeing that her INR was 1.8 and is worried that her PEs have gotten larger because of this. Also of note, patient called her pharmacy earlier in the day to get her Darvocet and was told that it was taken off the market;is asking for pain medication prescription today. Is supposed to be on O2 at home but uses it intermittently. Patient was hospitalized in late September for similar atypical chest pain which was felt to be secondary to her cerebral palsy as uses her upper extremities to move around with tremendous weightbearing on the anterior chest and pain was reproducbile and in same location as today's pain. CT at that time showed resolving PEs. She has been seen for this same complaint since that time - specifically when she needed a prescription for pain medication.     Medications Prior to Update: 1)  Baclofen 10 Mg Tabs (Baclofen) .... Take 1 Tablet By Mouth Four Times A Day 2)  Toprol Xl 100 Mg Tb24 (Metoprolol Succinate) .... Take 1 Tablet By Mouth Once A  Day 3)  Seroquel 200 Mg  Tabs (Quetiapine Fumarate) .... One Tab By Mouth Qhs 4)  Effexor Xr 150 Mg Cp24 (Venlafaxine Hcl) .... One Tab By Mouth Qday Per Psych 5)  Depo-Provera 150 Mg/ml Im Susp (Medroxyprogesterone Acetate) .... Q105m 6)  Darvocet A500 100-500 Mg Tabs (Propoxyphene N-Apap) .[name] Kitchen.. 1 Tablet By Mouth Once Daily 7)  Albuterol Sulfate (2.5 Mg/95ml) 0.083% Nebu (Albuterol Sulfate) .... Breathing Treatment Qid As Needed , Use For A Least One Month, Qs For 1 Months 8)  Tylenol With Codeine #3 300-30 Mg Tabs (Acetaminophen-Codeine) .... Oen Qid As Needed Cough Suprression 9)  Lidocaine Viscous 2 % Soln (Lidocaine Hcl) .... Paint Small Amount On Tongue With Cotton Swab Up To Qid As Needed For Pain.  Give 1 Small Bottle 10)  Lamictal 150 Mg Tabs (Lamotrigine) .... Per Dr Sharene Skeans 11)  Klonopin 1 Mg Tabs (Clonazepam) .... Per Dr Sharene Skeans 12)  Topiramate 25 Mg Tabs (Topiramate) .... Per Dr Sharene Skeans 13)  Zanaflex 2 Mg Caps (Tizanidine Hcl) .... Per Dr Sharene Skeans 14)  Coumadin 5 Mg Tabs (Warfarin Sodium) .... Take As Directed 15)  Oxybutynin Chloride 5 Mg Tabs (Oxybutynin Chloride) .... One Tab By Mouth Three Times A Day For Overactive Bladder 16)  Darvocet A500 100-500 Mg Tabs (Propoxyphene N-Apap) .... One Tab Qid As Needed 17)  Ventolin Hfa 108 (90 Base) Mcg/act Aers (Albuterol Sulfate) .[name] KitchenMarland KitchenMarland Kitchen  2 Puff Every 4 Hours As Needed For Shortness of Breath 18)  Aerochamber Mv  Misc (Spacer/aero-Holding Chambers) .... To Use With Mdi 19)  Tessalon Perles 100 Mg Caps (Benzonatate) .... Take 1-2 Tablets By Mouth Every 8 Hours As Needed For Cough.  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  DJD Dysarthria with mild dysphagia recurren UTI's  Endometriosis GERD H/o gestational DM w/ G2 H/o neurogenic  bladder LGSIL 10/00, 6/01, 1/02 Right shin MRSA abcess requiring I&D (3/06) PE-10/10-anticoagulated  Past Surgical History: Last updated: 07/25/2009 Baclofen pump insertion  8/00 Colposcopy--7/01--LGSIL - 07/08/2000 C-section x 2:  1997 & 2003 BTL 2003 Gallbladder U/S--normal  1/98 -,  I&D of right LE MRSA abscess - 02/27/2005,  Laproscopic excision of endometriosis and laproscopic uterosacral nerve ablation (LUNA) procedure 10/2001 Left Carpal Tunnel Ligament Release 08/2008 by Dr Teressa Senter Oral surgery for 10 teeth extraction 07/18/09  Family History: Last updated: 01/29/2007 No DM, CAD, HTN.  One GM d/o colon CA, the other of breast CA.  Physical Exam  General:  VS noted. Pt afebrile Not ill appearing woman in power wheel chair in NAD CP makes it difficult to understand patient at times.  Neck:  no JVD  Chest Wall:  tender to palpation over lower sternum / epigastrium  Lungs:  CTAB w/ normal work of breathing   Heart:  RRR, no murmurs, no JVD  Abdomen:  soft, mild tender to palpation epigastrium (moreso over sternum), nondistended, positive bowel sounds. Pulses:  2+ radials, regular  Neurologic:  Choreoathetoid Quad CP wheelchair bound, unable to assess extremity strength.   Impression & Recommendations:  Problem # 1:  CHEST WALL PAIN, ANTERIOR (ICD-786.52) Assessment Unchanged  Likely MSK given history, exam, stable vitals, O2 sats at baseline, patient near anticoagulation goal. Pain non pleuritic, reproducible with palpation, non exertional, not relieved by rest. MSK pain likely secondary to patient's use of upper extremities for mobilization with cerebral palsy. Changed Darvocet to Vicodin for pain control. Will have patient follow up with Dr. Janalyn Harder in 1-2 weeks, sooner if not improving. If concern for PE recurrence, would likely proceed to CT angio given high pre-test probability (with risk factor of prior PE).Discussed with Dr. [place] (also assessed patient) and Dr. McDiarmid,  who agree with plan of action.   Orders: [facility]- Est Level  3 (01093)  Complete Medication List: 1)  Baclofen 10 Mg Tabs (Baclofen) .... Take 1 tablet by mouth four times a day 2)  Toprol Xl 100 Mg Tb24 (Metoprolol succinate) .... Take 1 tablet by mouth once a day 3)  Seroquel 200 Mg Tabs (quetiapine Fumarate)  .... One tab by mouth qhs 4)  Effexor Xr 150 Mg Cp24 (Venlafaxine hcl) .... One tab by mouth qday per psych 5)  Depo-provera 150 Mg/ml Im Susp (Medroxyprogesterone acetate) .... Q60m 6)  Hydrocodone-acetaminophen 5-325 Mg Tabs (Hydrocodone-acetaminophen) .... One tab by mouth q 4 hrs as needed pan 7)  Albuterol Sulfate (2.5 Mg/11ml) 0.083% Nebu (Albuterol sulfate) .... Breathing treatment qid as needed , use for a least one month, qs for 1 months 8)  Tylenol With Codeine #3 300-30 Mg Tabs (Acetaminophen-codeine) .... Oen qid as needed cough suprression 9)  Lidocaine Viscous 2 % Soln (Lidocaine hcl) .... Paint small amount on tongue with cotton swab up to qid as needed for pain.  give 1 small bottle 10)  Lamictal 150 Mg Tabs (Lamotrigine) .... Per dr Sharene Skeans 11)  Klonopin 1 Mg Tabs (Clonazepam) .... Per dr Sharene Skeans 12)  Topiramate 25 Mg Tabs (Topiramate) .... Per dr Sharene Skeans 13)  Zanaflex 2 Mg Caps (Tizanidine hcl) .... Per dr Sharene Skeans 14)  Coumadin 5 Mg Tabs (Warfarin sodium) .... Take as directed 15)  Oxybutynin Chloride 5 Mg Tabs (Oxybutynin chloride) .... One tab by mouth three times a day for overactive bladder 16)  Darvocet A500 100-500 Mg Tabs (Propoxyphene n-apap) .... One tab qid as needed 17)  Ventolin Hfa 108 (90 Base) Mcg/act Aers (Albuterol sulfate) .... 2 puff every 4 hours as needed for shortness of breath 18)  Aerochamber Mv Misc (Spacer/aero-holding chambers) .... To use with mdi 19)  Tessalon Perles 100 Mg Caps (Benzonatate) .... Take 1-2 tablets by mouth every 8 hours as needed for cough.  Patient Instructions: 1)  Take Vicodin as needed for pain. 2)  If you start  having worse chest pain or shortness of breath, please come back in.  3)  Follow up with Dr. Janalyn Harder in 2 weeks  Prescriptions: HYDROCODONE-ACETAMINOPHEN 5-325 MG TABS (HYDROCODONE-ACETAMINOPHEN) one tab by mouth q 4 hrs as needed pan  #60 x 0   Entered and Authorized by:   Bobby Rumpf  MD   Signed by:   Bobby Rumpf  MD on 12/20/2009   Method used:   Printed then faxed to ...       [facility] (retail)       [address]       [place], [place]  [address]       Ph: [phone]       Fax: [phone]   RxID:   [id number]

## 2011-01-03 NOTE — Progress Notes (Signed)
Summary: phn msg   Phone Note Call from Patient Call back at Home Phone [phone]   Caller: Patient Summary of Call: pt needs to talk to nurse Initial call taken by: De Nurse,  June 07, 2010 11:03 AM  Follow-up for Phone Call        states she thinks she has a UTI. she will be here at 3pm for a work in. if she has difficulty finding a ride, she will call to change it to tomorrow. advised to drink plenty of water Follow-up by: Golden Circle RN,  June 07, 2010 11:55 AM

## 2011-01-03 NOTE — Assessment & Plan Note (Signed)
Summary: LEFT WRIST PAIN,MC   Vital Signs:  Patient profile:   35 year old female Height:      59 inches Weight:      161.4 pounds Pulse rate:   68 / minute BP sitting:   158 / 102  (left arm)  Vitals Entered By: Terese Door (January 19, 2010 11:36 AM) CC: left wrist pain   Primary Care Provider:  CAT TA MD  CC:  left wrist pain.  History of Present Illness: Reports to f/u severe DJD of the left wrist with chronic diffuse left wrist pain. complicated by spastic quadriplegia; her left hand is her only functional hand Pain greatly improved by corticosteroid injection 3-4 m ago. Pain gradully recurred in the interim. Now very severe; worst on movement and partly relieved by rest. Ibuprofen somewhat helps to relieve pain. No numbness or tingling. 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  DJD Dysarthria with mild dysphagia recurren UTI's  Endometriosis GERD H/o gestational DM w/ G2 H/o neurogenic bladder LGSIL 10/00, 6/01, 1/02 Right shin MRSA abcess requiring I&D (3/06) PE-10/10-anticoagulated  Past Surgical History: Last updated: 07/25/2009 Baclofen pump insertion  8/00 Colposcopy--7/01--LGSIL - 07/08/2000 C-section x 2:  1997 & 2003 BTL 2003 Gallbladder U/S--normal  1/98 -,  I&D of right LE MRSA abscess - 02/27/2005,  Laproscopic excision of endometriosis and laproscopic uterosacral nerve ablation (LUNA) procedure 10/2001 Left Carpal Tunnel Ligament Release 08/2008 by Dr Teressa Senter Oral surgery for 10 teeth extraction 07/18/09  Review of Systems  The patient denies anorexia, fever, weight loss, weight gain, and prolonged cough.    Physical Exam  General:  wheelchair bound, spastic quariparesis all extremities, some UE contractures. Msk:   left wrist contracture with some motion still possible , wrist essentailly extended with fingers mobile.  Some movement at wrist is still possible. No erythema or swelling of wrist. No skin lesions or changes. diffuse wrist tenderness with ARON, some w PROM.[name] Kitchen Does not have much ulnar or radial deviation actively.. Pulses:  2+ rad pulses.   Impression & Recommendations:  Problem # 1:  WRIST PAIN, LEFT (ICD-719.43)  Severechronic  DJD 2/2 chronic excessive compensatory wrist motion/loading due to severe cerebral palsy. Discussion re options which are very few. The only thing that would probably alleviate her pain is fusion which would take away her only functioning hand. She had some relief from steroid inj  3-4 m ago so we will repeat. I would like to do as few of these as possible, but understand her need for pain relief. Consider wrist x ray in near future to see exactly where we are with her degenerative change.  Complete Medication List: 1)  Baclofen 10 Mg Tabs (Baclofen) .... Take 1 tablet by mouth four times a day 2)  Toprol Xl 100 Mg Tb24 (Metoprolol succinate) .... Take 1 tablet by mouth once a day 3)  Seroquel 200 Mg Tabs (quetiapine Fumarate)  .... One tab by mouth qhs 4)  Effexor Xr 150 Mg Cp24 (Venlafaxine hcl) .... One tab by mouth qday per psych 5)  Depo-provera 150 Mg/ml Im Susp (Medroxyprogesterone acetate) .... Q49m 6)  Hydrocodone-acetaminophen 5-325 Mg Tabs (Hydrocodone-acetaminophen) .... One tab by mouth q 4 hrs as needed pan 7)  Albuterol Sulfate (2.5 Mg/30ml) 0.083% Nebu (Albuterol sulfate) .... Breathing treatment qid as needed ,  use for a least one month, qs for 1 months 8)  Tylenol With Codeine #3 300-30 Mg Tabs (Acetaminophen-codeine) .... Oen qid as needed cough suprression 9)  Lidocaine Viscous 2 % Soln (Lidocaine hcl) .... Paint small amount on tongue with cotton swab up to qid as needed for pain.  give 1 small bottle 10)  Lamictal 150 Mg Tabs (Lamotrigine) .... Per dr  Sharene Skeans 11)  Klonopin 1 Mg Tabs (Clonazepam) .... Per dr Sharene Skeans 12)  Topiramate 25 Mg Tabs (Topiramate) .... Per dr Sharene Skeans 13)  Zanaflex 2 Mg Caps (Tizanidine hcl) .... Per dr Sharene Skeans 14)  Coumadin 5 Mg Tabs (Warfarin sodium) .... Take as directed 15)  Oxybutynin Chloride 5 Mg Tabs (Oxybutynin chloride) .... One tab by mouth three times a day for overactive bladder 16)  Ventolin Hfa 108 (90 Base) Mcg/act Aers (Albuterol sulfate) .... 2 puff every 4 hours as needed for shortness of breath 17)  Aerochamber Mv Misc (Spacer/aero-holding chambers) .... To use with mdi 18)  Tessalon Perles 100 Mg Caps (Benzonatate) .... Take 1-2 tablets by mouth every 8 hours as needed for cough. 19)  Ibuprofen 600 Mg Tabs (Ibuprofen) .[name] Kitchen.. 1 tab by mouth two times a day as needed pain  Other Orders: Kenalog 10 mg inj (U0454) Joint Aspirate / Injection, Small (09811)

## 2011-01-03 NOTE — Progress Notes (Signed)
Summary: phn msg   Phone Note Call from Patient Call back at Home Phone [phone]   Caller: Patient Summary of Call: pt is requesting to speak with nurse Initial call taken by: De Nurse,  Apr 06, 2010 9:03 AM  Follow-up for Phone Call        she wanted to know why she was being referred back to her hand surgeon. told her it was because of continuing pain in L hand. she wanted to know results of x ray. told her I could not interpret all of it, but Dr. Janalyn Harder felt a specialist needed to see her & decide what could be done to help her pain. she was ok with answer.Golden Circle RN  Apr 06, 2010 9:17 AM  Follow-up by: Golden Circle RN,  Apr 06, 2010 9:13 AM

## 2011-01-03 NOTE — Assessment & Plan Note (Signed)
Summary: cold symptoms,congestd/bmc   Vital Signs:  Patient profile:   35 year old female Height:      59 inches Weight:      142.6 pounds BMI:     28.91 O2 Sat:      96 % on Room air Temp:     98.7 degrees F oral Pulse rate:   74 / minute BP sitting:   105 / 67  (left arm) Cuff size:   regular  Vitals Entered By: Arlyss Repress CMA, (December 18, 2010 11:08 AM)  O2 Flow:  Room air CC: cold symptoms x 3 weeks. Is Patient Diabetic? No Pain Assessment Patient in pain? no        Primary Care Provider:  CAT TA MD  CC:  cold symptoms x 3 weeks.[name] Kitchen  History of Present Illness:  Cold x 3 weeks, taking Advil congestion, mucinex congestion, these have not worked  Concerned about blood clots in lungs but knows she doesnt have it right now, no hemoptysis Cough with mild sputum production, no fever, no rhinorrhea, occ sore throat and hoarsness, feels a little sob when talking, +pleuritc chest pain , no change in stools, no body aches +flu shot   Habits & Providers  Alcohol-Tobacco-Diet     Tobacco Status: never  Current Medications (verified): 1)  Baclofen 10 Mg Tabs (Baclofen) .... Take 1 Tablet By Mouth Four Times A Day 2)  Toprol Xl 100 Mg Tb24 (Metoprolol Succinate) .... Take 1 Tablet By Mouth Once A Day 3)  Seroquel 200 Mg  Tabs (Quetiapine Fumarate) .... One Tab By Mouth Qhs 4)  Lamictal 150 Mg Tabs (Lamotrigine) .... Per Dr Sharene Skeans 5)  Klonopin 1 Mg Tabs (Clonazepam) .... Per Dr Sharene Skeans 6)  Zanaflex 2 Mg Caps (Tizanidine Hcl) .... Per Dr Sharene Skeans 7)  Oxybutynin Chloride 5 Mg Tabs (Oxybutynin Chloride) .... One Tab By Mouth Three Times A Day For Overactive Bladder 8)  Aerochamber Mv  Misc (Spacer/aero-Holding Chambers) .... To Use With Mdi 9)  Miralax  Powd (Polyethylene Glycol 3350) .... As Needed 10)  Imitrex 50 Mg Tabs (Sumatriptan Succinate) .[name] Kitchen.. 1 Tab By Mouth At Onset of Headache. Can Repeat in 2 Hours If Headache Persists.  Max 200mg  (4 Tabs Per Day) 11)   Amlodipine Besylate 5 Mg Tabs (Amlodipine Besylate) .... Take One Tablet By Mouth Daily 12)  Albuterol Sulfate (2.5 Mg/59ml) 0.083% Nebu (Albuterol Sulfate) .... 2.5mg -5mg  Inhaled By Nebulizer Every 4 Hours As Needed Shortness of Breath.  Dispense 1. 13)  Nebulizer/adult Mask  Kit (Respiratory Therapy Supplies) .... Use As Directed With [employer].  Dispense 1 14)  Ibuprofen 600 Mg Tabs (Ibuprofen) .[name] Kitchen.. 1 Tab By Mouth Three Times A Day As Needed Pain 15)  Tramadol Hcl 50 Mg Tabs (Tramadol Hcl) .[name] Kitchen.. 1 Tab By Mouth Three Times A Day For Pain 16)  Valium 5 Mg Tabs (Diazepam) .... Sig One By Mouth 1 Hour Prior To Procedure and May Repeat One Tab At Time of Procedure  Allergies (verified): No Known Drug Allergies  Past History:  Past Medical History: Last updated: 06/21/2010 1. migraines?--tx`d successfully w/ Imitrex (3/06) and prophylaxic by neuro 2. 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 DJD 4. Dysarthria with mild dysphagia 5. recurren UTI's  6. Endometriosis 7. GERD 8. H/o gestational DM w/ G2 9. H/o neurogenic bladder 10. 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 Right shin MRSA abcess requiring I&D (3/06) 12. PE-09/2009-anticoagulated for 7 months with Coumadin (d/c 04/2010) 13. sinus tachycardia 14. Altered mental  status 2/2 hypoventilation from anxiety (01/2010, [facility])  Review of Systems       Per HPI  Physical Exam  General:  alert.  cerbral pasly in motirized wheelchair with single hand control Vital signs noted  Eyes:  no discharge noted PERRL, EOMI Nose:  nares clear Mouth:  pharyngeal erythema, poor dentition, and teeth missing.  MMM Neck:  Supple, no LAD Lungs:  Decreased BS at bases, no rales or crackles heard, pt shows some effort with sentences, no retractions Harsh cough Heart:  normal rate, regular rhythm, no murmur, and no gallop.     Impression & Recommendations:  Problem # 1:  ACUTE BRONCHITIS (ICD-466.0) Assessment New  With prolonged  symptoms sputum production and habitus that does not allow pt to clear secretions well or cough with force, concern for any subclinical bacterial infection. Treat with Azithromycin, recheck in 2 days. Would obtain CXR if symptoms deteriorates or pt develops fever Her updated medication list for this problem includes:    Albuterol Sulfate (2.5 Mg/40ml) 0.083% Nebu (Albuterol sulfate) .[name] Kitchen... 2.5mg -5mg  inhaled by nebulizer every 4 hours as needed shortness of breath.  dispense 1.    Azithromycin 250 Mg Tabs (Azithromycin) .[name] Kitchen... Take 2 tablets by mouth x 1 day, then 1 tablet for 4 days    Guiatuss Ac 100-10 Mg/68ml Syrp (Guaifenesin-codeine) .[name] Kitchen... Take 1 teaspoon by mouth q 6 hours as needed for cough as needed  qs 30 day supply  Orders: [facility]- Est Level  3 ([address]109)  Complete Medication List: 1)  Baclofen 10 Mg Tabs (Baclofen) .... Take 1 tablet by mouth four times a day 2)  Toprol Xl 100 Mg Tb24 (Metoprolol succinate) .... Take 1 tablet by mouth once a day 3)  Seroquel 200 Mg Tabs (quetiapine Fumarate)  .... One tab by mouth qhs 4)  Lamictal 150 Mg Tabs (Lamotrigine) .... Per dr Sharene Skeans 5)  Klonopin 1 Mg Tabs (Clonazepam) .... Per dr hickling 6)  Zanaflex 2 Mg Caps (Tizanidine hcl) .... Per dr Sharene Skeans 7)  Oxybutynin Chloride 5 Mg Tabs (Oxybutynin chloride) .... One tab by mouth three times a day for overactive bladder 8)  Aerochamber Mv Misc (Spacer/aero-holding chambers) .... To use with mdi 9)  Miralax Powd (Polyethylene glycol 3350) .... As needed 10)  Imitrex 50 Mg Tabs (Sumatriptan succinate) .[name] Kitchen.. 1 tab by mouth at onset of headache. can repeat in 2 hours if headache persists.  max 200mg  (4 tabs per day) 11)  Amlodipine Besylate 5 Mg Tabs (Amlodipine besylate) .... Take one tablet by mouth daily 12)  Albuterol Sulfate (2.5 Mg/67ml) 0.083% Nebu (Albuterol sulfate) .... 2.5mg -5mg  inhaled by nebulizer every 4 hours as needed shortness of breath.  dispense 1. 13)  Nebulizer/adult Mask Kit  (Respiratory therapy supplies) .... Use as directed with neb machine.  dispense 1 14)  Ibuprofen 600 Mg Tabs (Ibuprofen) .[name] Kitchen.. 1 tab by mouth three times a day as needed pain 15)  Tramadol Hcl 50 Mg Tabs (Tramadol hcl) .[name] Kitchen.. 1 tab by mouth three times a day for pain 16)  Valium 5 Mg Tabs (Diazepam) .... Sig one by mouth 1 hour prior to procedure and may repeat one tab at time of procedure 17)  Azithromycin 250 Mg Tabs (Azithromycin) .... Take 2 tablets by mouth x 1 day, then 1 tablet for 4 days 18)  Guiatuss Ac 100-10 Mg/43ml Syrp (Guaifenesin-codeine) .... Take 1 teaspoon by mouth q 6 hours as needed for cough as needed  qs 30 day supply  Patient Instructions: 1)  Come back for a recheck on Friday  2)  I am treating for bronchitis 3)  Take the Azithromycin and the cough medication  4)  If you get worse, high fever > 101F, difficulty breathing then go to the ER  Prescriptions: GUIATUSS AC 100-10 MG/5ML SYRP (GUAIFENESIN-CODEINE) Take 1 teaspoon by mouth q 6 hours as needed for cough as needed  QS 30 day supply  #1 x 0   Entered and Authorized by:   Milinda Antis MD   Signed by:   Milinda Antis MD on 12/18/2010   Method used:   Telephoned to ...       [facility] (retail)       [address]       [place], [place]  [address]       Ph: [phone]       Fax: [phone]   RxID:   [id number] AZITHROMYCIN 250 MG TABS (AZITHROMYCIN) Take 2 tablets by mouth x 1 day, then 1 tablet for 4 days  #6 x 0   Entered and Authorized by:   Milinda Antis MD   Signed by:   Milinda Antis MD on 12/18/2010   Method used:   Electronically to        [facility] (retail)       [address]       [place], [place]  [id number]       Ph: [phone]       Fax: [phone]   RxID:   [id number]  Note Giatuss changed to 2 weeks supply, given  Orders Added: 1)  [facility]- Est Level  3 [[address]]

## 2011-01-03 NOTE — Assessment & Plan Note (Signed)
Summary: f/u per everhart/eo   Vital Signs:  Patient profile:   35 year old female Weight:      161.9 pounds O2 Sat:      97 % on Room air Temp:     97.9 degrees F BP sitting:   110 / 70  (right arm)  Vitals Entered By: Arlyss Repress CMA, (Apr 26, 2010 1:37 PM)  O2 Flow:  Room air CC: f/up ov yesterday. Is Patient Diabetic? No Pain Assessment Patient in pain? yes     Location: hands Intensity: 5   Primary Care Provider:  Caree Wolpert MD  CC:  f/up ov yesterday.[name] Kitchen  History of Present Illness: 35 y/o F with   Wrist pain: R wrist pain due to severe scapholunate ligamentous injury.  She has seen hand specialist in [facility] and has another appt on 05/02/10 with another hand specialist.  Pt states, "The doctor states, from one to ten, with ten being the worse, my wrist is a twelve."  States that she is in constant pain.  She has been taking hydrocodone 5/325 every 4 hours with only some relief.  She does not want to take oxycodone because it causes sedation.  She was here yestreday and received Toradol 30mg  IM, which gave her relief all day yesterday.  Today pain is 5 out of 10.     Dypsnea:  Feels short of breath sometimes.  She is concerned about this because she was recently taken off coumadin after 7 months for treatment of PE.  She was seen in ED last week for dypsnea and CTA was negative for PE.  She has home O2 but does not want to use this.    Habits & Providers  Alcohol-Tobacco-Diet     Tobacco Status: never  Current Medications (verified): 1)  Baclofen 10 Mg Tabs (Baclofen) .... Take 1 Tablet By Mouth Four Times A Day 2)  Toprol Xl 100 Mg Tb24 (Metoprolol Succinate) .... Take 1 Tablet By Mouth Once A Day 3)  Seroquel 200 Mg  Tabs (Quetiapine Fumarate) .... One Tab By Mouth Qhs 4)  Effexor Xr 150 Mg Cp24 (Venlafaxine Hcl) .... One Tab By Mouth Qday Per Psych 5)  Depo-Provera 150 Mg/ml Im Susp (Medroxyprogesterone Acetate) .... Q28m 6)  Hydrocodone-Acetaminophen 5-325 Mg  Tabs (Hydrocodone-Acetaminophen) .... One Tab By Mouth Q 3-4 Hrs As Needed Pain 7)  Lamictal 150 Mg Tabs (Lamotrigine) .... Per Dr Sharene Skeans 8)  Klonopin 1 Mg Tabs (Clonazepam) .... Per Dr Sharene Skeans 9)  Zanaflex 2 Mg Caps (Tizanidine Hcl) .... Per Dr Sharene Skeans 10)  Oxybutynin Chloride 5 Mg Tabs (Oxybutynin Chloride) .... One Tab By Mouth Three Times A Day For Overactive Bladder 11)  Aerochamber Mv  Misc (Spacer/aero-Holding Chambers) .... To Use With Mdi 12)  Ibuprofen 600 Mg Tabs (Ibuprofen) .[name] Kitchen.. 1 Tab By Mouth Two Times A Day As Needed Pain 13)  Fluticasone Propionate 0.05 % Crea (Fluticasone Propionate) .... As Needed 14)  Restoril 22.5 Mg Caps (Temazepam) .... At Bedtime 15)  Miralax  Powd (Polyethylene Glycol 3350) .... As Needed 16)  Imitrex 50 Mg Tabs (Sumatriptan Succinate) .... As Needed 17)  Amlodipine Besylate 5 Mg Tabs (Amlodipine Besylate) .... Take One Tablet By Mouth Daily 18)  Albuterol Sulfate (2.5 Mg/92ml) 0.083% Nebu (Albuterol Sulfate) .... 2.5mg -5mg  Inhaled By Nebulizer Every 4 Hours As Needed Shortness of Breath.  Dispense 1. 19)  Nebulizer/adult Mask  Kit (Respiratory Therapy Supplies) .... Use As Directed With [employer].  Dispense 1 20)  Oxycodone-Acetaminophen  5-325 Mg Tabs (Oxycodone-Acetaminophen) .... 1/2 Tab By Mouth Every 3-4 Hours As Needed Pain.  Do Not Take With Hydrocodone or Tylenol  Allergies (verified): No Known Drug Allergies  Past History:  Past Surgical History: Last updated: 03/22/2010 -Baclofen pump insertion  8/00 -Colposcopy--7/01--LGSIL - 07/08/2000 -C-section x 2:  1997 & 2003 -BTL 2003 -Gallbladder U/S--normal  1/98 -,  -I&D of right LE MRSA abscess - 02/27/2005,  -Laproscopic excision of endometriosis and laproscopic uterosacral nerve ablation (LUNA) procedure 10/2001 -Left Carpal Tunnel Ligament Release 08/2008 by Dr Teressa Senter -Oral surgery for 10 teeth extraction 07/18/09 -EEG 01/2010 by Dr Sharene Skeans: normal, no seizure activities, 6 events that  occurred were preceded by hypoventitation   Family History: Last updated: 01/29/2007 No DM, CAD, HTN.  One GM d/o colon CA, the other of breast CA.  Social History: Last updated: 02/22/2009 Ailene Ards, who has schizophrenia, MR, is father of daughter.  Homero Fellers and pt are no longer together.  Homero Fellers does not see [name]. Daughter is 53 yo, Stage manager, lives with pt.  71, 80 yo son, lives with pt's parents. Needs assistance with ADL's and IADL's--has personal care services 20 hrs/wk; -Because of [name]'s worsening condition in fall 2005, her son, [name], born in 1997, now lives w/ [name]'s father  Medical sales representative wheelchair.; -No tobacco, EtOH, drugs.; -Patient very concerned about her kids being taken from her; -in aug 06 Bitania began taking care of 37 year old [name] again; -going to [employer] for criminal justice, 4 classes left as of fall'06  Risk Factors: Smoking Status: never (04/26/2010)  Past Medical History: 1. ?migraines?--tx`d successfully w/ Imitrex (3/06) and prophylaxic by neuro 2. 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 DJD 4. Dysarthria with mild dysphagia 5. recurren UTI's  6. Endometriosis 7. GERD 8. H/o gestational DM w/ G2 9. H/o neurogenic bladder 10. 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 Right shin MRSA abcess requiring I&D (3/06) 12. PE-09/2009-anticoagulated for 7 months with Coumadin (d/c 04/2010) 13. sinus tachycardia 14. Altered mental status 2/2 hypoventilation from anxiety (01/2010, [facility])  Review of Systems       per hpi  Physical Exam  General:  Well-developed,well-nourished,in no acute distress; alert,appropriate and cooperative throughout examination. vitals reviewed.   Lungs:  Normal respiratory effort, chest expands symmetrically. Lungs are clear to auscultation, no crackles or wheezes. Heart:  Normal rate and regular rhythm. S1 and S2 normal without gallop, murmur, click, rub or other extra sounds.   Wrist/Hand Exam  Wrist Exam:     Right:    Inspection:  Abnormal    Palpation:  Normal    Stability:  stable    Tenderness:  right radial head    Swelling:  right radial head    Erythema:  no    contracture, no passive extension, strength 4 out of 5.     Impression & Recommendations:  Problem # 1:  WRIST PAIN, LEFT (EAV-409.81) Awaiting visit with hand surgery specialist.  Will try to treat pain until possible longterm solution.  Will refill hydrocodone.  Will also give a trial of oxycodone 5mg .  Since pt is concerned about sedation, have asked pharmacy to cut tabs in half and pt is to take 1/2 tab every 3 hours.  Will also give another toradol injection since pt is insisting on this today.  Renal function intact.   Orders: Ketorolac-Toradol 15mg  (X9147) [facility]- Est  Level 4 (82956)  Problem # 2:  DYSPNEA (ICD-786.05) May be due to deconditioning.  Has had PT.  Dyspnea most likely not PE.  Pt was in ER last week and PE was ruled out by CTA.  When she got to [facility] today she asked to have her O2 sat checked because she felt out of breath, and it was 97% room air.  May also be from anxiety. Pt has also stated that she has OCD and that when these ideas get into her head, she cannot forget about them.  Pt has home O2, but does not want to use it.  She states, "it is a pain."  Will try albuterol.  Pt has neb machine at home.  She needs abluterol, mask, and tubing.  Will prescribe these and see if it may help.   Pt was seen at [facility] in 01/2010 and Dx with Altered mental status  from hyperventilation due to anxiety.   Orders: Ketorolac-Toradol 15mg  ([id number]) [facility]- Est  Level 4 (81191)  Complete Medication List: 1)  Baclofen 10 Mg Tabs (Baclofen) .... Take 1 tablet by mouth four times a day 2)  Toprol Xl 100 Mg Tb24 (Metoprolol succinate) .... Take 1 tablet by mouth once a day 3)  Seroquel 200 Mg Tabs (quetiapine Fumarate)  .... One tab by mouth qhs 4)  Effexor Xr 150 Mg Cp24 (Venlafaxine hcl) .... One tab by mouth qday per psych 5)   Depo-provera 150 Mg/ml Im Susp (Medroxyprogesterone acetate) .... Q81m 6)  Hydrocodone-acetaminophen 5-325 Mg Tabs (Hydrocodone-acetaminophen) .... One tab by mouth q 3-4 hrs as needed pain 7)  Lamictal 150 Mg Tabs (Lamotrigine) .... Per dr Sharene Skeans 8)  Klonopin 1 Mg Tabs (Clonazepam) .... Per dr Sharene Skeans 9)  Zanaflex 2 Mg Caps (Tizanidine hcl) .... Per dr Sharene Skeans 10)  Oxybutynin Chloride 5 Mg Tabs (Oxybutynin chloride) .... One tab by mouth three times a day for overactive bladder 11)  Aerochamber Mv Misc (Spacer/aero-holding chambers) .... To use with mdi 12)  Ibuprofen 600 Mg Tabs (Ibuprofen) .[name] Kitchen.. 1 tab by mouth two times a day as needed pain 13)  Fluticasone Propionate 0.05 % Crea (Fluticasone propionate) .... As needed 14)  Restoril 22.5 Mg Caps (Temazepam) .... At bedtime 15)  Miralax Powd (Polyethylene glycol 3350) .... As needed 16)  Imitrex 50 Mg Tabs (Sumatriptan succinate) .... As needed 17)  Amlodipine Besylate 5 Mg Tabs (Amlodipine besylate) .... Take one tablet by mouth daily 18)  Albuterol Sulfate (2.5 Mg/28ml) 0.083% Nebu (Albuterol sulfate) .... 2.5mg -5mg  inhaled by nebulizer every 4 hours as needed shortness of breath.  dispense 1. 19)  Nebulizer/adult Mask Kit (Respiratory therapy supplies) .... Use as directed with neb machine.  dispense 1 20)  Oxycodone-acetaminophen 5-325 Mg Tabs (Oxycodone-acetaminophen) .... 1/2 tab by mouth every 3-4 hours as needed pain.  do not take with hydrocodone or tylenol Prescriptions: OXYCODONE-ACETAMINOPHEN 5-325 MG TABS (OXYCODONE-ACETAMINOPHEN) 1/2 tab by mouth every 3-4 hours as needed pain.  Do not take with Hydrocodone or Tylenol  #30 x 0   Entered and Authorized by:   Angeline Slim MD   Signed by:   Angeline Slim MD on 04/26/2010   Method used:   Telephoned to ...       [facility]* (retail)       [address]       [place], [place]  [id number]       Ph: [phone]       Fax: [phone]   RxID:    [id number] HYDROCODONE-ACETAMINOPHEN 5-325 MG TABS (HYDROCODONE-ACETAMINOPHEN) one tab by mouth q 3-4 hrs as needed pain  #60  x 3   Entered and Authorized by:   Angeline Slim MD   Signed by:   Angeline Slim MD on 04/26/2010   Method used:   Telephoned to ...       [facility] (retail)       [address]       [place], [place]  [address]       Ph: [phone]       Fax: [phone]   RxID:   [id number] NEBULIZER/ADULT MASK  KIT (RESPIRATORY THERAPY SUPPLIES) Use as directed with Neb machine.  Dispense 1  #1 x 0   Entered and Authorized by:   Angeline Slim MD   Signed by:   Angeline Slim MD on 04/26/2010   Method used:   Electronically to        [facility]* (retail)       [address]       [place], [place]  [address]       Ph: [phone]       Fax: [phone]   RxID:   [id number] ALBUTEROL SULFATE (2.5 MG/3ML) 0.083% NEBU (ALBUTEROL SULFATE) 2.5mg -5mg  inhaled by nebulizer every 4 hours as needed shortness of breath.  Dispense 1.  #1 x 3   Entered and Authorized by:   Angeline Slim MD   Signed by:   Angeline Slim MD on 04/26/2010   Method used:   Electronically to        [facility]* (retail)       [address]       [place], [place]  [address]       Ph: [phone]       Fax: [phone]   RxID:   [id number]    Medication Administration  Injection # 1:    Medication: Ketorolac-Toradol 15mg     Diagnosis: WRIST PAIN, LEFT (GYI-948.54)    Route: IM    Site: RUOQ gluteus    Exp Date: 06/02/2011    Lot #: [id number]    Mfr: [email or web address]    Comments: 30 mg given    Patient tolerated injection without complications    Given by: Arlyss Repress CMA, (Apr 26, 2010 2:30 PM)  Orders Added: 1)  Ketorolac-Toradol 15mg  [[id number]] 2)  [facility]- Est  Level 4 [09381]

## 2011-01-03 NOTE — Progress Notes (Signed)
Summary: re: vag bleeding/TS   Phone Note Call from Patient Call back at Home Phone [phone]   Caller: Patient Summary of Call: starting her period again -2 x in one month- wants to talk to nurse Initial call taken by: De Nurse,  November 01, 2010 9:05 AM  Follow-up for Phone Call        called pt and advised per last OV to d/c her BCP. Also informed of her appt tomorrow for the Pelvic US and also appt in [facility] 11-08-10. Pt verbalized understanding about the appointments.  I talked with pt on the phone for about 10 minutes explained. Pt is unclear about the instruction of her BCP. I told the pt, that Dr.Ta has to clarify this with her and fwd. the message to Dr.Ta for review and to call the pt. Follow-up by: Arlyss Repress CMA,,  November 01, 2010 11:56 AM  Additional Follow-up for Phone Call Additional follow up Details #1::        Pt had Pulmonary Emboli in past.  Should not be on estrogen birth control pills as it increases risk of blood clot.  I explained to patient at last visit.  Additional Follow-up by: Cat Ta MD,  November 01, 2010 12:09 PM    Additional Follow-up for Phone Call Additional follow up Details #2::    called pt. lmvm to call back. Follow-up by: Arlyss Repress CMA,,  November 01, 2010 3:08 PM

## 2011-01-03 NOTE — Consult Note (Signed)
Summary: [facility] - Discharge Summary  [facility] - Discharge Summary   Imported By: Clydell Hakim 01/15/2010 16:57:05  _____________________________________________________________________  External Attachment:    Type:   Image     Comment:   External Document

## 2011-01-03 NOTE — Assessment & Plan Note (Signed)
Summary: PAP/ENDO BIOPSY/KH   Vital Signs:  Patient profile:   35 year old female Weight:      146 pounds Temp:     98.7 degrees F oral Pulse rate:   81 / minute Pulse rhythm:   regular BP sitting:   119 / 88  (left arm) Cuff size:   regular  Vitals Entered By: Loralee Pacas CMA (November 08, 2010 10:24 AM)   Primary Care Provider:  CAT TA MD   History of Present Illness: here for endometrial bioopsy, discussion of IUD and menorrhagia  has had some intermittent vaginal bleeding--is worried she has endometrial cancer. Evidently her Mom had some kind of vaginal bleeding that turned out to be something "bad" and this frightens her. Intermittently sexually active---has had BTL.   Her periods have become longer over last few mionths and heavier. She has also had "extra" periods. No unusual abdominal cramping or pain.  Current Medications (verified): 1)  Baclofen 10 Mg Tabs (Baclofen) .... Take 1 Tablet By Mouth Four Times A Day 2)  Toprol Xl 100 Mg Tb24 (Metoprolol Succinate) .... Take 1 Tablet By Mouth Once A Day 3)  Seroquel 200 Mg  Tabs (Quetiapine Fumarate) .... One Tab By Mouth Qhs 4)  Lamictal 150 Mg Tabs (Lamotrigine) .... Per Dr Sharene Skeans 5)  Klonopin 1 Mg Tabs (Clonazepam) .... Per Dr Sharene Skeans 6)  Zanaflex 2 Mg Caps (Tizanidine Hcl) .... Per Dr Sharene Skeans 7)  Oxybutynin Chloride 5 Mg Tabs (Oxybutynin Chloride) .... One Tab By Mouth Three Times A Day For Overactive Bladder 8)  Aerochamber Mv  Misc (Spacer/aero-Holding Chambers) .... To Use With Mdi 9)  Miralax  Powd (Polyethylene Glycol 3350) .... As Needed 10)  Imitrex 50 Mg Tabs (Sumatriptan Succinate) .[name] Kitchen.. 1 Tab By Mouth At Onset of Headache. Can Repeat in 2 Hours If Headache Persists.  Max 200mg  (4 Tabs Per Day) 11)  Amlodipine Besylate 5 Mg Tabs (Amlodipine Besylate) .... Take One Tablet By Mouth Daily 12)  Albuterol Sulfate (2.5 Mg/35ml) 0.083% Nebu (Albuterol Sulfate) .... 2.5mg -5mg  Inhaled By Nebulizer Every 4 Hours As  Needed Shortness of Breath.  Dispense 1. 13)  Nebulizer/adult Mask  Kit (Respiratory Therapy Supplies) .... Use As Directed With [employer].  Dispense 1 14)  Ibuprofen 600 Mg Tabs (Ibuprofen) .[name] Kitchen.. 1 Tab By Mouth Three Times A Day As Needed Pain 15)  Tramadol Hcl 50 Mg Tabs (Tramadol Hcl) .[name] Kitchen.. 1 Tab By Mouth Three Times A Day For Pain 16)  Diclofenac Sodium 50 Mg Tbec (Diclofenac Sodium) .[name] Kitchen.. 1 Tab By Mouth Two Times A Day For The Next 5 Days 17)  Valium 5 Mg Tabs (Diazepam) .... Sig One By Mouth 1 Hour Prior To Procedure and May Repeat One Tab At Time of Procedure  Allergies: No Known Drug Allergies  Family History: Reviewed history from 01/29/2007 and no changes required. No DM, CAD, HTN.  One GM d/o colon CA, the other of breast CA.  Social History: Reviewed history from 05/14/2010 and no changes required. Ailene Ards, who has schizophrenia, MR, is father of daughter.  Homero Fellers and pt are no longer together.  Homero Fellers does not see [name]. Daughter is 11 yo, Stage manager, lives with pt.  25, 34 yo son, lives with pt's parents. Needs assistance with ADL's and IADL's--has personal care services 20 hrs/wk; -Because of [name]'s worsening condition in fall 2005, her son, [name], born in 1997, now lives w/ [name]'s father  Medical sales representative wheelchair.; -No tobacco, EtOH, drugs.; -Patient very concerned  about her kids being taken from her; -in aug 06 Lenaya began taking care of 37 year old [name] again  Physical Exam  General:  alert.  cerbral pasly in motirized wheelchair with single hand control dsarthria    Impression & Recommendations:  Problem # 1:  DYSFUNCTIONAL UTERINE BLEEDING (ICD-626.8)  I really do not think she needs endo biopsy and tried at length to explain thios to her, but she is so frightened that she has somethuing "bad" I cannot seem to get this reconciled. I agreed to do an endo bx, but with her contractures and the amount of time we have already used today, I do not have time to give  her valium and do the procedure. She says valium or a "muscle relaxer" makes it much easier for her legs to relax a little out of tehir contracture which is severe. I will agree to call in valium--she is already on klionipin and muscle relaxers for spasm so low dose valium--and we will do an endo bx next week. I  have arranged it off schedule on Monday. She is to think about the mirena IUD as it might decrease her vaginal bleeding--she does not need it for birth control as she has BTL Orders: [facility]- Est Level  3 ([address])  Complete Medication List: 1)  Baclofen 10 Mg Tabs (Baclofen) .... Take 1 tablet by mouth four times a day 2)  Toprol Xl 100 Mg Tb24 (Metoprolol succinate) .... Take 1 tablet by mouth once a day 3)  Seroquel 200 Mg Tabs (quetiapine Fumarate)  .... One tab by mouth qhs 4)  Lamictal 150 Mg Tabs (Lamotrigine) .... Per dr Sharene Skeans 5)  Klonopin 1 Mg Tabs (Clonazepam) .... Per dr hickling 6)  Zanaflex 2 Mg Caps (Tizanidine hcl) .... Per dr Sharene Skeans 7)  Oxybutynin Chloride 5 Mg Tabs (Oxybutynin chloride) .... One tab by mouth three times a day for overactive bladder 8)  Aerochamber Mv Misc (Spacer/aero-holding chambers) .... To use with mdi 9)  Miralax Powd (Polyethylene glycol 3350) .... As needed 10)  Imitrex 50 Mg Tabs (Sumatriptan succinate) .[name] Kitchen.. 1 tab by mouth at onset of headache. can repeat in 2 hours if headache persists.  max 200mg  (4 tabs per day) 11)  Amlodipine Besylate 5 Mg Tabs (Amlodipine besylate) .... Take one tablet by mouth daily 12)  Albuterol Sulfate (2.5 Mg/12ml) 0.083% Nebu (Albuterol sulfate) .... 2.5mg -5mg  inhaled by nebulizer every 4 hours as needed shortness of breath.  dispense 1. 13)  Nebulizer/adult Mask Kit (Respiratory therapy supplies) .... Use as directed with neb machine.  dispense 1 14)  Ibuprofen 600 Mg Tabs (Ibuprofen) .[name] Kitchen.. 1 tab by mouth three times a day as needed pain 15)  Tramadol Hcl 50 Mg Tabs (Tramadol hcl) .[name] Kitchen.. 1 tab by mouth three times a day  for pain 16)  Diclofenac Sodium 50 Mg Tbec (Diclofenac sodium) .[name] Kitchen.. 1 tab by mouth two times a day for the next 5 days 17)  Valium 5 Mg Tabs (Diazepam) .... Sig one by mouth 1 hour prior to procedure and may repeat one tab at time of procedure [facility]- Est Level  3 (60454) Prescriptions: VALIUM 5 MG TABS (DIAZEPAM) sig one by mouth 1 hour prior to procedure and may repeat one tab at time of procedure  #2 x 0   Entered and Authorized by:   Denny Levy MD   Signed by:   Denny Levy MD on 11/09/2010   Method used:   Telephoned to .[name] KitchenMarland Kitchen  [facility] (retail)       [address]       [place], [place]  [id number]       Ph: [phone]       Fax: [phone]   RxID:   [id number]    Orders Added: 1)  [facility]- Est Level  3 [24401]     Impression & Recommendations:   Orders: [facility]- Est Level  3 (02725)    Complete Medication List: 1)  Baclofen 10 Mg Tabs (Baclofen) .... Take 1 tablet by mouth four times a day 2)  Toprol Xl 100 Mg Tb24 (Metoprolol succinate) .... Take 1 tablet by mouth once a day 3)  Seroquel 200 Mg Tabs (quetiapine Fumarate)  .... One tab by mouth qhs 4)  Lamictal 150 Mg Tabs (Lamotrigine) .... Per dr Sharene Skeans 5)  Klonopin 1 Mg Tabs (Clonazepam) .... Per dr hickling 6)  Zanaflex 2 Mg Caps (Tizanidine hcl) .... Per dr Sharene Skeans 7)  Oxybutynin Chloride 5 Mg Tabs (Oxybutynin chloride) .... One tab by mouth three times a day for overactive bladder 8)  Aerochamber Mv Misc (Spacer/aero-holding chambers) .... To use with mdi 9)  Miralax Powd (Polyethylene glycol 3350) .... As needed 10)  Imitrex 50 Mg Tabs (Sumatriptan succinate) .[name] Kitchen.. 1 tab by mouth at onset of headache. can repeat in 2 hours if headache persists.  max 200mg  (4 tabs per day) 11)  Amlodipine Besylate 5 Mg Tabs (Amlodipine besylate) .... Take one tablet by mouth daily 12)  Albuterol Sulfate (2.5 Mg/75ml) 0.083% Nebu (Albuterol sulfate) .... 2.5mg -5mg  inhaled by nebulizer every 4 hours as  needed shortness of breath.  dispense 1. 13)  Nebulizer/adult Mask Kit (Respiratory therapy supplies) .... Use as directed with neb machine.  dispense 1 14)  Ibuprofen 600 Mg Tabs (Ibuprofen) .[name] Kitchen.. 1 tab by mouth three times a day as needed pain 15)  Tramadol Hcl 50 Mg Tabs (Tramadol hcl) .[name] Kitchen.. 1 tab by mouth three times a day for pain 16)  Diclofenac Sodium 50 Mg Tbec (Diclofenac sodium) .[name] Kitchen.. 1 tab by mouth two times a day for the next 5 days 17)  Valium 5 Mg Tabs (Diazepam) .... Sig one by mouth 1 hour prior to procedure and may repeat one tab at time of procedure

## 2011-01-03 NOTE — Assessment & Plan Note (Signed)
Summary: f/u tue visit/eo   Vital Signs:  Patient profile:   35 year old female O2 Sat:      98 % on Room air Temp:     98.5 degrees F oral Pulse rate:   81 / minute BP sitting:   104 / 68  (right arm)  Vitals Entered By: Arlyss Repress CMA, (December 21, 2010 11:43 AM)  O2 Flow:  Room air CC: cough Is Patient Diabetic? No Pain Assessment Patient in pain? no        Primary Care Provider:  CAT TA MD  CC:  cough.  History of Present Illness:  Cold x 3 weeks, Treated for acute bronchitis as pt does not have ability to produce a forceful cough to remove sputum. Cough improved with codiene, but feels she can break up the mucous and now has a dull aching in chest non radiating assocaited with SOB and worse with inspiration.  No fever, no emesis Taking anitbiotics as prescribed Would like cough medication dose increased   Habits & Providers  Alcohol-Tobacco-Diet     Tobacco Status: never  Current Medications (verified): 1)  Baclofen 10 Mg Tabs (Baclofen) .... Take 1 Tablet By Mouth Four Times A Day 2)  Toprol Xl 100 Mg Tb24 (Metoprolol Succinate) .... Take 1 Tablet By Mouth Once A Day 3)  Seroquel 200 Mg  Tabs (Quetiapine Fumarate) .... One Tab By Mouth Qhs 4)  Lamictal 150 Mg Tabs (Lamotrigine) .... Per Dr Sharene Skeans 5)  Klonopin 1 Mg Tabs (Clonazepam) .... Per Dr Sharene Skeans 6)  Zanaflex 2 Mg Caps (Tizanidine Hcl) .... Per Dr Sharene Skeans 7)  Oxybutynin Chloride 5 Mg Tabs (Oxybutynin Chloride) .... One Tab By Mouth Three Times A Day For Overactive Bladder 8)  Aerochamber Mv  Misc (Spacer/aero-Holding Chambers) .... To Use With Mdi 9)  Miralax  Powd (Polyethylene Glycol 3350) .... As Needed 10)  Imitrex 50 Mg Tabs (Sumatriptan Succinate) .[name] Kitchen.. 1 Tab By Mouth At Onset of Headache. Can Repeat in 2 Hours If Headache Persists.  Max 200mg  (4 Tabs Per Day) 11)  Amlodipine Besylate 5 Mg Tabs (Amlodipine Besylate) .... Take One Tablet By Mouth Daily 12)  Albuterol Sulfate (2.5 Mg/57ml) 0.083%  Nebu (Albuterol Sulfate) .... 2.5mg -5mg  Inhaled By Nebulizer Every 4 Hours As Needed Shortness of Breath.  Dispense 1. 13)  Nebulizer/adult Mask  Kit (Respiratory Therapy Supplies) .... Use As Directed With [employer].  Dispense 1 14)  Ibuprofen 600 Mg Tabs (Ibuprofen) .[name] Kitchen.. 1 Tab By Mouth Three Times A Day As Needed Pain 15)  Tramadol Hcl 50 Mg Tabs (Tramadol Hcl) .[name] Kitchen.. 1 Tab By Mouth Three Times A Day For Pain 16)  Valium 5 Mg Tabs (Diazepam) .... Sig One By Mouth 1 Hour Prior To Procedure and May Repeat One Tab At Time of Procedure 17)  Azithromycin 250 Mg Tabs (Azithromycin) .... Take 2 Tablets By Mouth X 1 Day, Then 1 Tablet For 4 Days 18)  Guiatuss Ac 100-10 Mg/37ml Syrp (Guaifenesin-Codeine) .... Take 2  Teaspoon By Mouth Q 4 Hours As Needed For Cough As Needed  Qs \\par  19)  Prednisone 20 Mg Tabs (Prednisone) .... 2 By Mouth Daily 20)  Mucinex 600 Mg Xr12h-Tab (Guaifenesin) .... 2 Tabs By Mouth Two Times A Day X 1 Week, Then As Needed  Allergies (verified): No Known Drug Allergies  Review of Systems       Per HPI  Physical Exam  General:  alert.  cerbral pasly in motirized wheelchair with single hand control  Vital signs noted  Lungs:  Decreased BS at bases, no rales or crackles heard, pt shows some effort with sentences, no retractions Harsh cough, upper airway congestion not able to take deep breaths, exam difficult Heart:  normal rate, regular rhythm, no murmur,   EKG- NSR, no ST changes   Impression & Recommendations:  Problem # 1:  ACUTE BRONCHITIS (ICD-466.0) Assessment Unchanged  Will need more help with pulmonary toilet, unable to use a flutter valve, will add Mucinex at high dose, also give 5 day course of steroids to act as anti-inflammatory , pt has nebulizer at home which she is also using as needed. Continue antibiotics Discussed with preceptor Her updated medication list for this problem includes:    Albuterol Sulfate (2.5 Mg/2ml) 0.083% Nebu (Albuterol sulfate)  .[name] Kitchen... 2.5mg -5mg  inhaled by nebulizer every 4 hours as needed shortness of breath.  dispense 1.    Azithromycin 250 Mg Tabs (Azithromycin) .[name] Kitchen... Take 2 tablets by mouth x 1 day, then 1 tablet for 4 days    Guiatuss Ac 100-10 Mg/64ml Syrp (Guaifenesin-codeine) .[name] Kitchen... Take 2  teaspoon by mouth q 4 hours as needed for cough as needed  qs \    Mucinex 600 Mg Xr12h-tab (Guaifenesin) .[name] Kitchen... 2 tabs by mouth two times a day x 1 week, then as needed  Orders: [facility]- Est  Level 4 ([address])  Problem # 2:  CHEST PAIN (ICD-786.50) Assessment: New Secondary to acute bronchitis, neg EKG  Orders: EKG- [facility] (EKG) [facility]- Est  Level 4 (60454)  Complete Medication List: 1)  Baclofen 10 Mg Tabs (Baclofen) .... Take 1 tablet by mouth four times a day 2)  Toprol Xl 100 Mg Tb24 (Metoprolol succinate) .... Take 1 tablet by mouth once a day 3)  Seroquel 200 Mg Tabs (quetiapine Fumarate)  .... One tab by mouth qhs 4)  Lamictal 150 Mg Tabs (Lamotrigine) .... Per dr Sharene Skeans 5)  Klonopin 1 Mg Tabs (Clonazepam) .... Per dr hickling 6)  Zanaflex 2 Mg Caps (Tizanidine hcl) .... Per dr Sharene Skeans 7)  Oxybutynin Chloride 5 Mg Tabs (Oxybutynin chloride) .... One tab by mouth three times a day for overactive bladder 8)  Aerochamber Mv Misc (Spacer/aero-holding chambers) .... To use with mdi 9)  Miralax Powd (Polyethylene glycol 3350) .... As needed 10)  Imitrex 50 Mg Tabs (Sumatriptan succinate) .[name] Kitchen.. 1 tab by mouth at onset of headache. can repeat in 2 hours if headache persists.  max 200mg  (4 tabs per day) 11)  Amlodipine Besylate 5 Mg Tabs (Amlodipine besylate) .... Take one tablet by mouth daily 12)  Albuterol Sulfate (2.5 Mg/12ml) 0.083% Nebu (Albuterol sulfate) .... 2.5mg -5mg  inhaled by nebulizer every 4 hours as needed shortness of breath.  dispense 1. 13)  Nebulizer/adult Mask Kit (Respiratory therapy supplies) .... Use as directed with neb machine.  dispense 1 14)  Ibuprofen 600 Mg Tabs (Ibuprofen) .[name] Kitchen.. 1 tab by mouth three times  a day as needed pain 15)  Tramadol Hcl 50 Mg Tabs (Tramadol hcl) .[name] Kitchen.. 1 tab by mouth three times a day for pain 16)  Valium 5 Mg Tabs (Diazepam) .... Sig one by mouth 1 hour prior to procedure and may repeat one tab at time of procedure 17)  Azithromycin 250 Mg Tabs (Azithromycin) .... Take 2 tablets by mouth x 1 day, then 1 tablet for 4 days 18)  Guiatuss Ac 100-10 Mg/34ml Syrp (Guaifenesin-codeine) .... Take 2  teaspoon by mouth q 4 hours as needed for cough as needed  qs \\par  19)  Prednisone 20 Mg Tabs (Prednisone) .... 2 by mouth daily 20)  Mucinex 600 Mg Xr12h-tab (Guaifenesin) .... 2 tabs by mouth two times a day x 1 week, then as needed  Patient Instructions: 1)  Increase the cough medication to 2 teaspoons every 4 hours as needed for cough 2)  Start the prednisone 40mg  daily x 5 days 3)  Start the mucinex 4)  Follow up next week if you are not improved  Prescriptions: GUIATUSS AC 100-10 MG/5ML SYRP (GUAIFENESIN-CODEINE) Take 2  teaspoon by mouth q 4 hours as needed for cough as needed  QS  #240 x 0   Entered and Authorized by:   Milinda Antis MD   Signed by:   Milinda Antis MD on 12/21/2010   Method used:   Telephoned to ...       [facility]* (retail)       [address]       [place], [place]  [address]       Ph: [phone]       Fax: [phone]   RxID:   [id number] MUCINEX 600 MG XR12H-TAB (GUAIFENESIN) 2 tabs by mouth two times a day x 1 week, then as needed  #30 x 1   Entered and Authorized by:   Milinda Antis MD   Signed by:   Milinda Antis MD on 12/21/2010   Method used:   Electronically to        [facility] (retail)       [address]       [place], [place]  [id number]       Ph: [phone]       Fax: [phone]   RxID:   [id number] PREDNISONE 20 MG TABS (PREDNISONE) 2 by mouth daily  #10 x 0   Entered and Authorized by:   Milinda Antis MD   Signed by:   Milinda Antis MD on 12/21/2010    Method used:   Electronically to        [facility] (retail)       [address]       [place], [place]  [address]       Ph: [phone]       Fax: [phone]   RxID:   [id number]    Orders Added: 1)  EKG- [facility] [EKG] 2)  [facility]- Est  Level 4 [[address]94]

## 2011-01-03 NOTE — Progress Notes (Signed)
Summary: triage   Phone Note Call from Patient Call back at Home Phone [phone]   Caller: Patient Summary of Call: pt asking to speak to [facility]. Initial call taken by: Clydell Hakim,  Apr 20, 2010 11:31 AM  Follow-up for Phone Call        LM Follow-up by: Golden Circle RN,  Apr 20, 2010 11:56 AM  Additional Follow-up for Phone Call Additional follow up Details #1::        c/o SOB & CP. no longer on coumadin per pt. started yesterday, getting worse.   told her to call 911 & go to ED now. she agreed Additional Follow-up by: Golden Circle RN,  Apr 20, 2010 12:01 PM

## 2011-01-03 NOTE — Progress Notes (Signed)
Summary: re: Dr.Sypher appt/TS  ---- Converted from flag ---- ---- 01/01/2010 9:48 PM, Cat Ta MD wrote: Please look into two orthop referrals: 11/18: appt made with Dr Teressa Senter on 11/29, did she keep it?  thanks. CT ------------------------------ called pt. reports that her hands has gotten worse. she thinks that she did. she has appt with Dr.Ta tomorrow. We will call Dr.Sypher's office and find out.

## 2011-01-03 NOTE — Assessment & Plan Note (Signed)
Summary: hand pain continues   Vital Signs:  Patient Profile:   35 Years Old Female Height:     59 inches Weight:      152.2 pounds BMI:     30.85 Temp:     97.7 degrees F oral Pulse rate:   80 / minute BP sitting:   105 / 73  (left arm)  Pt. in pain?   yes    Location:   hand    Intensity:   8    Type:       aching  Vitals Entered By: Modesta Messing LPN (December 27, 2008 2:28 PM)                  PCP:  CAT TA MD  Chief Complaint:  Hand pain.[name] Kitchen  History of Present Illness: [name] [name] is a 35 year old female with spastic CP presenting for continued left wrist/hand pain.  1. Left wrist pain: points to wrist when describing pain, radiates into palm, all fingers; worse with flexion and extension. She denies radiation into elbow, swelling, numbness, or tingling. Had CTL surgery 9/09. She admits that she lost her brace, so is not wearing it. Uses left hand for most ADLs, pain is interfering with her abiltiy to take care of her children.  She was recently seen by Dr. Yetta Barre who started a regimen of Diclofenac with Darvocet for breakthrough pain. She states that she is only able to take the Diclofenac once daily due to it "making her sleepy." She says that the Darvocet has helped.        Current Allergies: No known allergies      Review of Systems  MS      Complains of joint pain.      Denies joint redness, joint swelling, and loss of strength.   Physical Exam  General:     Wheel-chairbound, well-developed, well-nourished, in no acute distress; alert, appropriate and cooperative throughout examination. Msk:     Contractures of arms and legs at baseline. Tender to palpation along wrist.  Limited flexion and extension secondary to pain. No joint swelling, erythema, or redness.  Sensation intact to soft touch.      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 Likely etiology related to contractures developed secondary to CP and frequent holding of left wrist in flexed position. Discussed felxion contractures with patient. Demonstrated wrist extension exercises. Will send to PT. Patient voiced understanding and is happy about going to PT. Advised patient to buy OTC wrist splint as Medicaid does not cover them and to wear it at night. I will refill Darvocet and Dicolfenac today with no refills. Follow up with PCP, Dr. Janalyn Harder, in 1-3 months to re-evaluate. Orders: [facility]- Est Level  3 (04540)   Complete Medication List: 1)  Baclofen 10 Mg Tabs (Baclofen) .... Take 1 tablet by mouth four times a day 2)  Toprol Xl 100 Mg Tb24 (Metoprolol succinate) .... Take 1 tablet by mouth once a day 3)  Tramadol Hcl 50 Mg Tabs (Tramadol hcl) .... 1;2 two times a day - qid prn 4)  Xanax 0.5 Mg Tabs (Alprazolam) .... One by mouth three times a day per psych 5)  Zoloft 100 Mg Tabs (Sertraline hcl) .... 2 tabs by mouth qday 6)  Prilosec 40 Mg Cpdr (Omeprazole) .... One tab by mouth qday 7)  Seroquel 300 Mg Tabs (Quetiapine  fumarate) .... One tab by mouth qhs 8)  Effexor Xr 150 Mg Cp24 (Venlafaxine hcl) .... One tab by mouth qday per psych 9)  Imitrex 100 Mg Tabs (Sumatriptan succinate) .... One tab by mouth at start of headache.  okay to take another tab 2 hours later, but no more than 200 mg in 24 hours. 10)  Depo-provera 150 Mg/ml Im Susp (Medroxyprogesterone acetate) .... Q23m 11)  Diclofenac Sodium 50 Mg Tbec (Diclofenac sodium) .[name] Kitchen.. 1 tablet by mouth two times a day for pain 12)  Darvocet A500 100-500 Mg Tabs (Propoxyphene n-apap) .[name] Kitchen.. 1 tablet by mouth twice daily as needed for pain  Other Orders: Physical Therapy Referral (PT)   Patient Instructions: 1)  Please follow up with Dr. Janalyn Harder in 1-3 months. Please call if you have any problems before then. 2)  We will refill your prescriptions of Diclofenac and Darvocet today. 3)  We will call you  regarding Physical Therapy. 4)  Buy a wrist splint at your local drug store and wear it at night.   Prescriptions: DICLOFENAC SODIUM 50 MG TBEC (DICLOFENAC SODIUM) 1 tablet by mouth two times a day for pain  #60 x 0   Entered and Authorized by:   Helane Rima MD   Signed by:   Helane Rima MD on 12/27/2008   Method used:   Electronically to        [facility] (retail)       [address]       [place], [place]  [address]       Ph: [phone]       Fax: [phone]   RxID:   [id number] DARVOCET A500 100-500 MG TABS (PROPOXYPHENE N-APAP) 1 tablet by mouth twice daily as needed for pain  #60 x 0   Entered by:   [name] [name] RN   Authorized by:   Helane Rima MD   Signed by:   Jacki Cones RN on 12/27/2008   Method used:   Printed then faxed to ...       [facility] (retail)       [address]       [place], [place]  [id number]       Ph: [phone]       Fax: [phone]   RxID:   [id number]

## 2011-01-03 NOTE — Assessment & Plan Note (Signed)
Summary: pain/[place]/ta   Vital Signs:  Patient profile:   35 year old female Height:      59 inches Weight:      160 pounds BMI:     32.43 O2 Sat:      90 % on Room air Temp:     98.0 degrees F oral Pulse rate:   82 / minute BP sitting:   107 / 74  (left arm) Cuff size:   large  Vitals Entered By: Garen Grams LPN (Apr 25, 2010 11:19 AM)  O2 Flow:  Room air CC: pain in hand Is Patient Diabetic? No Pain Assessment Patient in pain? yes     Location: left hand   Primary Care Provider:  CAT TA MD  CC:  pain in hand.  History of Present Illness: 1. pain Reports persistent L wrist pain. Radiates to the elbow. Is chronic and related to flexion contracture form her cerebral palsy. To see surgeon at [facility] 05/02/10. States that vicodin doesn't last 4 hours but percocet makes her sleepy. Would like better control of her pain. On baclofen, zanaflex and ibuprofen as well.  2. shortness of breath O2 sat initially 90%, improved to 94% during visit. Has history of PE diagnosed 10/10; s/p coumadin -- came off 04/11/10. Seen in ED 5/20 for chest pain and SOB. CTA of the chest done at that time and negative for PE. States the chest pain has resolved but SOB persists.   ROS: no chest pain; no wheezing. Has chronic contracture of the right hand and can't do much of anything with it, making her L wrist pain more problematic.  PMHx: CP  Habits & Providers  Alcohol-Tobacco-Diet     Tobacco Status: never  Current Medications (verified): 1)  Baclofen 10 Mg Tabs (Baclofen) .... Take 1 Tablet By Mouth Four Times A Day 2)  Toprol Xl 100 Mg Tb24 (Metoprolol Succinate) .... Take 1 Tablet By Mouth Once A Day 3)  Seroquel 200 Mg  Tabs (Quetiapine Fumarate) .... One Tab By Mouth Qhs 4)  Effexor Xr 150 Mg Cp24 (Venlafaxine Hcl) .... One Tab By Mouth Qday Per Psych 5)  Depo-Provera 150 Mg/ml Im Susp (Medroxyprogesterone Acetate) .... Q40m 6)  Hydrocodone-Acetaminophen 5-325 Mg Tabs (Hydrocodone-Acetaminophen)  .... One Tab By Mouth Q 4 Hrs As Needed Pan 7)  Lamictal 150 Mg Tabs (Lamotrigine) .... Per Dr Sharene Skeans 8)  Klonopin 1 Mg Tabs (Clonazepam) .... Per Dr Sharene Skeans 9)  Zanaflex 2 Mg Caps (Tizanidine Hcl) .... Per Dr Sharene Skeans 10)  Oxybutynin Chloride 5 Mg Tabs (Oxybutynin Chloride) .... One Tab By Mouth Three Times A Day For Overactive Bladder 11)  Aerochamber Mv  Misc (Spacer/aero-Holding Chambers) .... To Use With Mdi 12)  Ibuprofen 600 Mg Tabs (Ibuprofen) .[name] Kitchen.. 1 Tab By Mouth Two Times A Day As Needed Pain 13)  Fluticasone Propionate 0.05 % Crea (Fluticasone Propionate) .... As Needed 14)  Restoril 22.5 Mg Caps (Temazepam) .... At Bedtime 15)  Miralax  Powd (Polyethylene Glycol 3350) .... As Needed 16)  Imitrex 50 Mg Tabs (Sumatriptan Succinate) .... As Needed 17)  Amlodipine Besylate 5 Mg Tabs (Amlodipine Besylate) .... Take One Tablet By Mouth Daily  Allergies (verified): No Known Drug Allergies  Review of Systems       review of systems is negative except as noted in HPI.    Physical Exam  General:  vital signs reviewed and normal Alert, in wheelchair  Lungs:  work of breathing unlabored, clear to auscultation bilaterally; no wheezes, rales,  or ronchi; good air movement throughout; normal insp/exp ratio Heart:  regular rate and rhythm, no murmurs; normal s1/s2  Msk:  Left Wrist: Mild edema of dorsal aspect of her distal radius near the ulnar-radius joint Decreased ROM due to spasticity. Wrist is held in some flexion but she can extend 4/5 grip strength Able to wiggle all of her fingers 2+ radial pulse sensation intact   Impression & Recommendations:  Problem # 1:  WRIST PAIN, LEFT (ICD-719.43) Assessment Deteriorated increase hydrocodone to every 2 hours as needed. follow-up tomorrow with Dr. Janalyn Harder who knows pt better. Do not want to increase to oxycodone due to increased sedation and c/o SOB. Return parameters and warning signs of worsening illness discussed.  Patient agreeable.  See instructions   Problem # 2:  DYSPNEA (ICD-786.05) Assessment: New  O2 sat at 94% (initially 90%). Recently taken off coumadin after PE treatment but CTA 5/20 negative for PE. Large w/u would not be helpful at this point. Consider albuterol. Has some home O2 and I told the patient she could use that to see if it helped. Pt to call or be seen for worseningr SOB. To see Dr. Janalyn Harder tomrrow at 1:30 pm.   Orders: [facility]- Est  Level 4 ([address])  Complete Medication List: 1)  Baclofen 10 Mg Tabs (Baclofen) .... Take 1 tablet by mouth four times a day 2)  Toprol Xl 100 Mg Tb24 (Metoprolol succinate) .... Take 1 tablet by mouth once a day 3)  Seroquel 200 Mg Tabs (quetiapine Fumarate)  .... One tab by mouth qhs 4)  Effexor Xr 150 Mg Cp24 (Venlafaxine hcl) .... One tab by mouth qday per psych 5)  Depo-provera 150 Mg/ml Im Susp (Medroxyprogesterone acetate) .... Q2m 6)  Hydrocodone-acetaminophen 5-325 Mg Tabs (Hydrocodone-acetaminophen) .... One tab by mouth q 4 hrs as needed pan 7)  Lamictal 150 Mg Tabs (Lamotrigine) .... Per dr Sharene Skeans 8)  Klonopin 1 Mg Tabs (Clonazepam) .... Per dr Sharene Skeans 9)  Zanaflex 2 Mg Caps (Tizanidine hcl) .... Per dr Sharene Skeans 10)  Oxybutynin Chloride 5 Mg Tabs (Oxybutynin chloride) .... One tab by mouth three times a day for overactive bladder 11)  Aerochamber Mv Misc (Spacer/aero-holding chambers) .... To use with mdi 12)  Ibuprofen 600 Mg Tabs (Ibuprofen) .[name] Kitchen.. 1 tab by mouth two times a day as needed pain 13)  Fluticasone Propionate 0.05 % Crea (Fluticasone propionate) .... As needed 14)  Restoril 22.5 Mg Caps (Temazepam) .... At bedtime 15)  Miralax Powd (Polyethylene glycol 3350) .... As needed 16)  Imitrex 50 Mg Tabs (Sumatriptan succinate) .... As needed 17)  Amlodipine Besylate 5 Mg Tabs (Amlodipine besylate) .... Take one tablet by mouth daily  Patient Instructions: 1)  increase the hydrocodone to every 2 hours as needed...don't take it if you feel more short of  breath or sleepy 2)  follow-up for worsening shortness of breath or uncontrolled pain 3)  follow-up tomorrow at 1:30 with Dr. Janalyn Harder -- ok to double book.   Appended Document: pain/[place]/ta   Medication Administration  Injection # 1:    Medication: Ketorolac-Toradol 15mg     Diagnosis: WRIST PAIN, LEFT (JWJ-191.47)    Route: IM    Site: LUOQ gluteus    Exp Date: 11/02/2011    Lot #: [id number]    Mfr: Hospira    Comments: Patient recieved 30mg  of Toradol    Patient tolerated injection without complications    Given by: Garen Grams LPN (Apr 25, 2010 4:57 PM)  Orders Added: 1)  Ketorolac-Toradol 15mg  [M8413]

## 2011-01-03 NOTE — Progress Notes (Signed)
Summary: need to speak with heather   Phone Note Call from Patient Call back at Home Phone [phone]   Caller: Patient Reason for Call: Talk to Nurse Details for Reason: per pt calling. would like for pt to call her back Initial call taken by: Lorne Skeens,  February 02, 2010 8:50 AM  Follow-up for Phone Call        Left message to call back Meredith Staggers, RN  February 02, 2010 1:40 PM   spoke w/pt she took monitor off this am around 9, she will get it back to [place] as soon as she can Meredith Staggers, RN  February 02, 2010 2:00 PM

## 2011-01-04 ENCOUNTER — Encounter: Payer: Self-pay | Admitting: Family Medicine

## 2011-01-05 ENCOUNTER — Telehealth: Payer: Self-pay | Admitting: Family Medicine

## 2011-01-07 NOTE — Discharge Summary (Signed)
[name], [name] NO.:  [id number]  MEDICAL RECORD NO.:  [id number]           PATIENT TYPE:  I  LOCATION:  2603                         FACILITY:  [facility]  PHYSICIAN:  Paula Compton, MD        DATE OF BIRTH:  [birth year]  DATE OF ADMISSION:  12/31/2010 DATE OF DISCHARGE:  01/03/2011                              DISCHARGE SUMMARY   PRIMARY CARE PROVIDER:  Angeline Slim, MD at [facility]  DISCHARGE DIAGNOSES: 1. Bilateral lower lobe pulmonary emboli. 2. Cerebral palsy. 3. History of hypertension without need for medicines in the hospital.  DISCHARGE MEDICATIONS: 1. Lovenox 100 mg subcu daily until INR is above 2. 2. Mucinex 600 mg p.o. t.i.d. p.r.n. cough. 3. Oxycodone 5 mg p.o. q.6 p.r.n. pain. 4. Coumadin 7.5 mg p.o. daily. 5. Albuterol nebulizer 2.5 mg/3 mL 1 nebulization inhaled q.4 p.r.n.     shortness of breath. 6. Baclofen subcutaneous pump. 7. Baclofen 10 mg 1 tab p.o. b.i.d. 8. Effexor XR 150 mg 2 tabs p.o. every morning. 9. Ibuprofen 600 mg p.o. t.i.d. p.r.n. pain. 10.Clonazepam 1 mg 1-2 tabs p.o. b.i.d. 11.Lamictal 150 mg p.o. daily. 12.Oxybutynin 5 mg 1 tab in the morning, 2 tabs at bedtime p.o. 13.MiraLax 17 g p.o. daily p.r.n. constipation. 14.Promethazine 25 mg p.o. q6 p.r.n. for nausea. 15.Seroquel 200 mg p.o. at bedtime. 16.Topiramate 25 mg 2 tabs p.o. daily. 17.Tramadol 50 mg 1-2 tabs p.o. q.6 p.r.n. for pain. 18.Zanaflex 2 mg 2 tabs p.o. every evening.  DISCONTINUED MEDICATIONS:  Amlodipine and metoprolol.  PERTINENT LAB VALUES:  On December 31, 2010, CBC with differential and basic metabolic panel were unremarkable.  ProTime was 13.6, INR was 1.02.  On January 03, 2011, CBC, white blood cell count 5.9, hemoglobin 10.6, hematocrit 32.7, platelets 226.  ProTime was 14.5, INR 1.11.  RADIOLOGY:  On December 31, 2010, a CT angiogram of the chest showed positive for bilateral pulmonary emboli in bilateral posterior lower lobe  pulmonary artery branches.  On January 01, 2011, lower extremity Dopplers were negative for deep vein or superficial thrombosis in right or left lower extremity.  On January 01, 2011, echocardiogram showed a normal systolic function with ejection fraction of 55%, grade 2 diastolic dysfunction, and no evidence of right heart strain.  BRIEF HOSPITAL COURSE:  [name] is a 35 year old female with past medical history of cerebral palsy and pulmonary embolus in October 2010, who presented to clinic, complaining of shortness of breath.  A D- dimer was felt to be elevated in the office and the patient was sent to the emergency room for CT angiogram which showed pulmonary embolus.  The patient was admitted to the [facility] Medicine Team.  1. PE.  The patient was started on a heparin drip and Coumadin on     admission.  She was started on IV fluids.  The next day, the     patient was transitioned to Lovenox injections and continued on     Coumadin.  [facility] was set up, so the patient could go     home with home Lovenox injections and  INR checks.  The patient will     have daily visits from the home health nurse and the INR results     will be called to the patient's primary care provider, Dr. Angeline Slim     for Coumadin adjustments. 2. Pain.  The patient had pain with her shortness of breath that was     well controlled with oxycodone during this hospitalization.  She     was discharged home with some oxycodone. 3. Cerebral palsy.  The patient was placed on her home medications and     was stable neurologically during this hospitalization. 4. History of hypertension.  The patient had previously been on     hypertensive medications, however, on admission, she had some low     blood pressures and her blood pressure medicines were held.  The     patient was normotensive throughout hospitalization and so     hypertensive medications were discontinued at discharge.  FOLLOWUP ISSUES  AND RECOMMENDATIONS:  The patient will be seen by [facility] and will have daily Lovenox injections and INR checks.  The INR levels should be called to Dr. Angeline Slim, her pager [phone] for INR results and Coumadin adjustments.  The patient is also to follow up with Dr. Tawanna Cooler at [facility] on January 22, 2011, at 2:30 p.m.  The patient was discharged home in stable medical condition.    ______________________________ Ardyth Gal, MD   ______________________________ Paula Compton, MD    CR/MEDQ  D:  01/03/2011  T:  01/04/2011  Job:  [id number]  Electronically Signed by Ardyth Gal MD on 01/06/2011 12:24:05 PM Electronically Signed by Paula Compton MD on 01/07/2011 11:53:08 AM

## 2011-01-08 ENCOUNTER — Telehealth: Payer: Self-pay | Admitting: Family Medicine

## 2011-01-08 ENCOUNTER — Encounter: Payer: Self-pay | Admitting: Family Medicine

## 2011-01-08 ENCOUNTER — Telehealth: Payer: Self-pay | Admitting: *Deleted

## 2011-01-09 ENCOUNTER — Telehealth: Payer: Self-pay | Admitting: Family Medicine

## 2011-01-09 NOTE — Progress Notes (Signed)
Summary: Rx for guiatuss [facility]   Phone Note Call from Patient   Caller: Patient Call For: [phone] Summary of Call: [name] [name] called requesting a rx for antibiotics, but she was given an rx at the Urgent Care that she didn't have filled.  Want to speak with the nurse regarding this.   Initial call taken by: Abundio Miu,  December 31, 2010 2:10 PM  Follow-up for Phone Call        very hard to understand. she kept referring to her visit at UC last night. she put her caregiver on the phone who said pt wants a refill on her codiene cough syrup. uses [facility]  to pcp& Dr. Swaziland who saw her today Follow-up by: Golden Circle RN,  December 31, 2010 2:27 PM  Additional Follow-up for Phone Call Additional follow up Details #1::        We could do a refill of the cough syrup.  Could you call it in to [facility]?  Thanks Additional Follow-up by: Sarah Swaziland MD,  December 31, 2010 3:01 PM    Prescriptions: GUIATUSS AC 100-10 MG/5ML SYRP (GUAIFENESIN-CODEINE) Take 2  teaspoon by mouth q 4 hours as needed for cough as needed  QS  #240 x 0   Entered and Authorized by:   Sarah Swaziland MD   Signed by:   Sarah Swaziland MD on 12/31/2010   Method used:   Telephoned to ...       [facility] (retail)       [address]       [place], [place]  [id number]       Ph: [phone]       Fax: [phone]   RxID:   [id number]

## 2011-01-09 NOTE — Telephone Encounter (Signed)
Pt states the oxycodone makes her too sleepy. Wants hydrocodone instead. Uses [facility]. Also needs a letter faxed to [facility] saying that she is unable to continue going to school due to her physical problems. Fax to Passenger transport manager. To pcp

## 2011-01-09 NOTE — Assessment & Plan Note (Signed)
Summary: trouble breathing,df   Vital Signs:  Patient profile:   35 year old female Weight:      140 pounds O2 Sat:      94 % on Room air Temp:     98.3 degrees F oral Pulse rate:   100 / minute Pulse rhythm:   regular BP sitting:   140 / 86  (left arm) Cuff size:   regular  O2 Flow:  Room air  Primary Care Provider:  CAT TA MD   History of Present Illness: 35 yo female seen several times recently for bronchitis.  Treated with a course of axithromycin amd steroids.  Pt reports that she was feeling very short of breath Saturday night, but did not want to come to ED because she feels she knew she would stay in the ER for awhile and because she recently moved in with family.  She reports nasal and chest congestion.  + chest pain when she coughs.  Does not feel like her PE in the past.   Completed her course of abx and steroids.    Seen at urgent care yesterday and they gave another round of antibiotics (Bactrim)and a chest x-ray, which showed low lung volumes and vascular crowding and atelectasis, but no infiltrate or effusion.   Presents today asking for admission for IV antibiotics and O2.    C/o HA, worried that her BP is 140/86.  Feels nausea, HA for a few days, + photophobia.  No phonophobia.    Allergies: No Known Drug Allergies  Social History: Ailene Ards, who has schizophrenia, MR, is father of daughter.  Homero Fellers and pt are no longer together.  Homero Fellers does not see [name]. Daughter is 58 yo, Stage manager, lives with pt.  17, 43 yo son, lives with pt's parents. Needs assistance with ADL's and IADL's--has personal care services 20 hrs/wk; -Because of [name]'s worsening condition in fall 2005, her son, [name], born in 1997, now lives w/ [name]'s father  Medical sales representative wheelchair.; -No tobacco, EtOH, drugs.; -Patient very concerned about her kids being taken from her; -in aug 06 Orvetta began taking care of 37 year old [name] again 12/31/10 - Pt reports she and [name] now live with her  family.  Review of Systems       see HPI  Physical Exam  General:  Wheelchair bound.  Iin no acute distress; alert,appropriate and cooperative throughout examination Head:  Normocephalic and atraumatic without obvious abnormalities. No apparent alopecia or balding. Eyes:  No conjunctival injection or discharge Ears:  External ear exam shows no significant lesions or deformities.  Otoscopic examination reveals clear canals, tympanic membranes are intact bilaterally without bulging, retraction, inflammation or discharge. Hearing is grossly normal bilaterally. Nose:  External nasal examination shows no deformity or inflammation. Nasal mucosa are pink and moist without lesions or exudates. +Crusting at nares Mouth:  Oral mucosa and oropharynx without lesions or exudates.  Mildly dry mucous membranes Neck:  No deformities, masses, or tenderness noted. Lungs:  Normal respiratory effort, chest expands symmetrically. Lungs are clear to auscultation, no crackles or wheezes.  Decreased volumes appreciated on exam with poor movement in bases B Heart:  Normal rate and regular rhythm. S1 and S2 normal without gallop, murmur, click, rub or other extra sounds. Extremities:  No erythema, TTP  or swelling of BLE Neurologic:  Contractures of hands.  Speech dysarthric.  Wheelchair bound   Impression & Recommendations:  Problem # 1:  ACUTE BRONCHITIS (ICD-466.0) Pt has been treated with abx  and steroids and is still symptomatic.  Likely due to low lung volumes and inability to expand well given her CP and posture.  Discussed with [facility] - they will be able to get a physicial therapist to the home today or tomorrow for pulmonary toilet.  Pt agrees with this plan. In addition, pt has h/o PE with O2 sats at 94%.  Will check D-dimer today, and if elevated, will need CT angiogram to r/o.  She stopped her warfarin in March.   Her updated medication list for this problem includes:    Albuterol Sulfate  (2.5 Mg/37ml) 0.083% Nebu (Albuterol sulfate) .[name] Kitchen... 2.5mg -5mg  inhaled by nebulizer every 4 hours as needed shortness of breath.  dispense 1.    Azithromycin 250 Mg Tabs (Azithromycin) .[name] Kitchen... Take 2 tablets by mouth x 1 day, then 1 tablet for 4 days    Guiatuss Ac 100-10 Mg/51ml Syrp (Guaifenesin-codeine) .[name] Kitchen... Take 2  teaspoon by mouth q 4 hours as needed for cough as needed  qs \    Mucinex 600 Mg Xr12h-tab (Guaifenesin) .[name] Kitchen... 2 tabs by mouth two times a day x 1 week, then as needed  Orders: [facility] (04540) Home Health Referral (Home Health)  Complete Medication List: 1)  Baclofen 10 Mg Tabs (Baclofen) .... Take 1 tablet by mouth four times a day 2)  Toprol Xl 100 Mg Tb24 (Metoprolol succinate) .... Take 1 tablet by mouth once a day 3)  Seroquel 200 Mg Tabs (quetiapine Fumarate)  .... One tab by mouth qhs 4)  Lamictal 150 Mg Tabs (Lamotrigine) .... Per dr Sharene Skeans 5)  Klonopin 1 Mg Tabs (Clonazepam) .... Per dr hickling 6)  Zanaflex 2 Mg Caps (Tizanidine hcl) .... Per dr Sharene Skeans 7)  Oxybutynin Chloride 5 Mg Tabs (Oxybutynin chloride) .... One tab by mouth three times a day for overactive bladder 8)  Aerochamber Mv Misc (Spacer/aero-holding chambers) .... To use with mdi 9)  Miralax Powd (Polyethylene glycol 3350) .... As needed 10)  Imitrex 50 Mg Tabs (Sumatriptan succinate) .[name] Kitchen.. 1 tab by mouth at onset of headache. can repeat in 2 hours if headache persists.  max 200mg  (4 tabs per day) 11)  Amlodipine Besylate 5 Mg Tabs (Amlodipine besylate) .... Take one tablet by mouth daily 12)  Albuterol Sulfate (2.5 Mg/39ml) 0.083% Nebu (Albuterol sulfate) .... 2.5mg -5mg  inhaled by nebulizer every 4 hours as needed shortness of breath.  dispense 1. 13)  Nebulizer/adult Mask Kit (Respiratory therapy supplies) .... Use as directed with neb machine.  dispense 1 14)  Ibuprofen 600 Mg Tabs (Ibuprofen) .[name] Kitchen.. 1 tab by mouth three times a day as needed pain 15)  Tramadol Hcl 50 Mg Tabs (Tramadol hcl) .[name] Kitchen.. 1 tab by  mouth three times a day for pain 16)  Valium 5 Mg Tabs (Diazepam) .... Sig one by mouth 1 hour prior to procedure and may repeat one tab at time of procedure 17)  Azithromycin 250 Mg Tabs (Azithromycin) .... Take 2 tablets by mouth x 1 day, then 1 tablet for 4 days 18)  Guiatuss Ac 100-10 Mg/16ml Syrp (Guaifenesin-codeine) .... Take 2  teaspoon by mouth q 4 hours as needed for cough as needed  qs \\par  19)  Prednisone 20 Mg Tabs (Prednisone) .... 2 by mouth daily 20)  Mucinex 600 Mg Xr12h-tab (Guaifenesin) .... 2 tabs by mouth two times a day x 1 week, then as needed  Other Orders: [facility]-Dimer- [facility] ([id number])   Orders Added: 1)  [facility] [85027] 2)  D-Dimer- [facility] [[id number]] 3)  Home Health Referral [Home Health]  Appended Document: trouble breathing,df     Allergies: No Known Drug Allergies   Complete Medication List: 1)  Baclofen 10 Mg Tabs (Baclofen) .... Take 1 tablet by mouth four times a day 2)  Toprol Xl 100 Mg Tb24 (Metoprolol succinate) .... Take 1 tablet by mouth once a day 3)  Seroquel 200 Mg Tabs (quetiapine Fumarate)  .... One tab by mouth qhs 4)  Lamictal 150 Mg Tabs (Lamotrigine) .... Per dr Sharene Skeans 5)  Klonopin 1 Mg Tabs (Clonazepam) .... Per dr hickling 6)  Zanaflex 2 Mg Caps (Tizanidine hcl) .... Per dr Sharene Skeans 7)  Oxybutynin Chloride 5 Mg Tabs (Oxybutynin chloride) .... One tab by mouth three times a day for overactive bladder 8)  Aerochamber Mv Misc (Spacer/aero-holding chambers) .... To use with mdi 9)  Miralax Powd (Polyethylene glycol 3350) .... As needed 10)  Imitrex 50 Mg Tabs (Sumatriptan succinate) .[name] Kitchen.. 1 tab by mouth at onset of headache. can repeat in 2 hours if headache persists.  max 200mg  (4 tabs per day) 11)  Amlodipine Besylate 5 Mg Tabs (Amlodipine besylate) .... Take one tablet by mouth daily 12)  Albuterol Sulfate (2.5 Mg/65ml) 0.083% Nebu (Albuterol sulfate) .... 2.5mg -5mg  inhaled by nebulizer every 4 hours as needed shortness of breath.   dispense 1. 13)  Nebulizer/adult Mask Kit (Respiratory therapy supplies) .... Use as directed with neb machine.  dispense 1 14)  Ibuprofen 600 Mg Tabs (Ibuprofen) .[name] Kitchen.. 1 tab by mouth three times a day as needed pain 15)  Tramadol Hcl 50 Mg Tabs (Tramadol hcl) .[name] Kitchen.. 1 tab by mouth three times a day for pain 16)  Valium 5 Mg Tabs (Diazepam) .... Sig one by mouth 1 hour prior to procedure and may repeat one tab at time of procedure 17)  Azithromycin 250 Mg Tabs (Azithromycin) .... Take 2 tablets by mouth x 1 day, then 1 tablet for 4 days 18)  Guiatuss Ac 100-10 Mg/16ml Syrp (Guaifenesin-codeine) .... Take 2  teaspoon by mouth q 4 hours as needed for cough as needed  qs \\par  19)  Prednisone 20 Mg Tabs (Prednisone) .... 2 by mouth daily 20)  Mucinex 600 Mg Xr12h-tab (Guaifenesin) .... 2 tabs by mouth two times a day x 1 week, then as needed  Other Orders: Ketorolac-Toradol 15mg  ([id number])   Medication Administration  Injection # 1:    Medication: Ketorolac-Toradol 15mg     Diagnosis: CHEST PAIN (ICD-786.50)    Route: IM    Site: RUOQ gluteus    Exp Date: 05/02/2012    Lot #: [id number]    Mfr: nova plus    Patient tolerated injection without complications    Given by: Loralee Pacas CMA (January 01, 2011 7:55 PM)  Orders Added: 1)  Ketorolac-Toradol 15mg  [[id number]]

## 2011-01-09 NOTE — Miscellaneous (Signed)
Summary: Radiology   Radiology calling re: order for CTA.  They don't have an order, I gave a verbal order as they are at [facility] and I cannot go over there now. Rodney Langton MD  December 31, 2010 5:30 PM

## 2011-01-09 NOTE — Progress Notes (Signed)
Summary: [facility] home health order   ---- Converted from flag ---- ---- 12/31/2010 9:20 PM, Sarah Swaziland MD wrote: Could you call [facility] and let them know that this pt got admitted, so probably won't be home on Tuesday for PT?  Thanks! Sarah ------------------------------  Phone Note Outgoing Call Call back at [phone]   Call placed by: Loralee Pacas CMA,  January 01, 2011 9:26 AM Call placed to: Specialist Summary of Call: called [facility] and they did not have any information regarding this pt's therapy for 01.31.2012.  spoke with betsy and she stated that she was unsure of what is needed for the pt. i told her that i would contact dr. Swaziland to get clarification concerning this order for her forwarded to dr. Swaziland

## 2011-01-09 NOTE — Progress Notes (Addendum)
  Medications Added LOVENOX 100 MG/ML SOLN (ENOXAPARIN SODIUM) Inject 100mg  sub q daily x 7 days       Phone Note Other Incoming   Caller: Patient Summary of Call: Pt called  recently discharged from hospital with PE now on coumadin , states she did not get her Lovenox yesterday or today, because she was told she needed a prior authorization. I confirmed this with [facility]. Without insurnace Lovenox is $1000. I called [employer] prior authorization [id number] they state because the generic was sent this needs a prior authorization but the brand name does not. [facility] only has the generic as well as other pharmacies, I did call [facility] they did not have it, [facility] [address] [facility] and [facility], pt informed  Note pt called yesterday- though no note in chart and told to call back today,   Call placed by: Milinda Antis MD,  January 05, 2011 1:03 PM    New/Updated Medications: LOVENOX 100 MG/ML SOLN (ENOXAPARIN SODIUM) Inject 100mg  sub q daily x 7 days Prescriptions: LOVENOX 100 MG/ML SOLN (ENOXAPARIN SODIUM) Inject 100mg  sub q daily x 7 days  #7 x 0   Entered and Authorized by:   Milinda Antis MD   Signed by:   Milinda Antis MD on 01/05/2011   Method used:   Handwritten   RxID:   [id number]  [facility]ed [facility] , with DAW, brand medically necessary writtin on script

## 2011-01-09 NOTE — Progress Notes (Signed)
   Phone Note Call from Patient Call back at Home Phone [phone]   Caller: Patient Summary of Call: Please call Ms. Guise back when you get a chance.  She is still waiting to find out what is her problem. Initial call taken by: Abundio Miu,  January 02, 2011 1:55 PM  Follow-up for Phone Call        pt is calling again  Follow-up by: De Nurse,  January 02, 2011 3:38 PM  Additional Follow-up for Phone Call Additional follow up Details #1::        I spoke to pt already.  She wanted to have her oxygen up to 3L, and I already spoke to her nurse reading this.  She also wanted oxycodone for 4-6 hours vs 6 hours.  She did not want to chang room but I told her that she is doing well and does not need step down room anymore. She agreed.  Additional Follow-up by: Nitin Mckowen MD,  January 02, 2011 4:06 PM

## 2011-01-10 ENCOUNTER — Encounter (HOSPITAL_COMMUNITY): Payer: Self-pay | Admitting: Radiology

## 2011-01-10 ENCOUNTER — Other Ambulatory Visit: Payer: Self-pay | Admitting: Family Medicine

## 2011-01-10 ENCOUNTER — Emergency Department (HOSPITAL_COMMUNITY): Payer: Medicaid Other

## 2011-01-10 ENCOUNTER — Emergency Department (HOSPITAL_COMMUNITY)
Admission: EM | Admit: 2011-01-10 | Discharge: 2011-01-10 | Disposition: A | Discharge status: Home / Self Care | Payer: Medicaid Other | Attending: Emergency Medicine | Admitting: Emergency Medicine

## 2011-01-10 ENCOUNTER — Encounter: Payer: Self-pay | Admitting: Family Medicine

## 2011-01-10 DIAGNOSIS — Z86718 Personal history of other venous thrombosis and embolism: Secondary | ICD-10-CM | POA: Insufficient documentation

## 2011-01-10 DIAGNOSIS — R0609 Other forms of dyspnea: Secondary | ICD-10-CM | POA: Insufficient documentation

## 2011-01-10 DIAGNOSIS — I2699 Other pulmonary embolism without acute cor pulmonale: Secondary | ICD-10-CM | POA: Insufficient documentation

## 2011-01-10 DIAGNOSIS — G809 Cerebral palsy, unspecified: Secondary | ICD-10-CM | POA: Insufficient documentation

## 2011-01-10 DIAGNOSIS — F3289 Other specified depressive episodes: Secondary | ICD-10-CM | POA: Insufficient documentation

## 2011-01-10 DIAGNOSIS — R0602 Shortness of breath: Secondary | ICD-10-CM | POA: Insufficient documentation

## 2011-01-10 DIAGNOSIS — F329 Major depressive disorder, single episode, unspecified: Secondary | ICD-10-CM | POA: Insufficient documentation

## 2011-01-10 DIAGNOSIS — K219 Gastro-esophageal reflux disease without esophagitis: Secondary | ICD-10-CM | POA: Insufficient documentation

## 2011-01-10 DIAGNOSIS — R079 Chest pain, unspecified: Secondary | ICD-10-CM | POA: Insufficient documentation

## 2011-01-10 DIAGNOSIS — Z7901 Long term (current) use of anticoagulants: Secondary | ICD-10-CM | POA: Insufficient documentation

## 2011-01-10 DIAGNOSIS — R0989 Other specified symptoms and signs involving the circulatory and respiratory systems: Secondary | ICD-10-CM | POA: Insufficient documentation

## 2011-01-10 HISTORY — DX: Cerebral palsy, unspecified: G80.9

## 2011-01-10 LAB — CBC
HCT: 37.7 % (ref 36.0–46.0)
Hemoglobin: 12 g/dL (ref 12.0–15.0)
MCH: 27 pg (ref 26.0–34.0)
MCHC: 31.8 g/dL (ref 30.0–36.0)
MCV: 84.9 fL (ref 78.0–100.0)
RDW: 14.2 % (ref 11.5–15.5)

## 2011-01-10 LAB — DIFFERENTIAL
Eosinophils Relative: 3 % (ref 0–5)
Lymphocytes Relative: 41 % (ref 12–46)
Lymphs Abs: 1.8 10*3/uL (ref 0.7–4.0)
Monocytes Absolute: 0.4 10*3/uL (ref 0.1–1.0)
Monocytes Relative: 8 % (ref 3–12)

## 2011-01-10 LAB — POCT I-STAT, CHEM 8
Calcium, Ion: 1.16 mmol/L (ref 1.12–1.32)
Glucose, Bld: 95 mg/dL (ref 70–99)
HCT: 39 % (ref 36.0–46.0)
Hemoglobin: 13.3 g/dL (ref 12.0–15.0)
Potassium: 3.9 mEq/L (ref 3.5–5.1)
TCO2: 24 mmol/L (ref 0–100)

## 2011-01-10 LAB — POCT CARDIAC MARKERS: CKMB, poc: 1.1 ng/mL (ref 1.0–8.0)

## 2011-01-10 MED ORDER — HYDROCODONE-ACETAMINOPHEN 5-325 MG PO TABS
1.0000 | ORAL_TABLET | ORAL | Status: DC | PRN
Start: 2011-01-10 — End: 2011-03-27

## 2011-01-10 MED ORDER — IOHEXOL 300 MG/ML  SOLN
100.0000 mL | Freq: Once | INTRAMUSCULAR | Status: AC | PRN
  Administered 2011-01-10: 100 mL via INTRAVENOUS

## 2011-01-11 ENCOUNTER — Encounter: Payer: Self-pay | Admitting: Family Medicine

## 2011-01-11 NOTE — H&P (Signed)
[name] [name], STANKIEWICZ NO.:  [id number]  MEDICAL RECORD NO.:  [id number]          PATIENT TYPE:  INP  LOCATION:  2603                         FACILITY:  [facility]  PHYSICIAN:  Nestor Ramp, MD        DATE OF BIRTH:  Feb 12, 1976  DATE OF ADMISSION:  12/31/2010 DATE OF DISCHARGE:  12/13/2010                             HISTORY & PHYSICAL   PRIMARY CARE PROVIDER:  Angeline Slim, MD at [facility].  NEUROLOGIST:  Deanna Artis. Hickling, MD  CHIEF COMPLAINT:  Shortness of breath.  HISTORY OF PRESENT ILLNESS:  The patient states that she has been feeling short of breath on and off for a while and she has been treated for bronchitis but did not get all the way better.  She says that last Saturday night, she felt extremely short of breath.  She says she went to the Urgent Care on Sunday and they said it was just bronchitis again. The patient did not feel better and she was seen in clinic today, Monday and Dr. [place] ordered a D-dimer which was elevated and send the patient for a CTA of the chest which was positive for PE.  The patient had a PE back in September 2010.  She stopped taking her Coumadin after about 6 months of treatment after discussing the pros and cons of continuing Coumadin treatment with Dr. Janalyn Harder.  REVIEW OF SYSTEMS:  Negative except as stated in the HPI.  MEDICATIONS: 1. Baclofen 10 mg p.o. q.i.d. 2. Toprol-XL 100 mg p.o. daily. 3. Seroquel 200 mg p.o. at bedtime. 4. Lamictal 150 mg p.o. daily. 5. Klonopin 1 mg p.o. in the a.m., 2 mg p.o. in the p.m. 6. Zanaflex 2 mg p.o. in the p.m. 7. Oxybutynin 5 mg p.o. t.i.d. 8. MiraLax 17 grams p.o. p.r.n. constipation. 9. Amlodipine 5 mg p.o. daily. 10.Ibuprofen 600 mg p.o. t.i.d. for pain. 11.Tramadol 50 mg p.o. t.i.d. p.r.n. for pain.  PAST MEDICAL HISTORY: 1. Migraines. 2. 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 3. Degenerative joint disease. 4. Dysarthria with mild  dysphasia. 5. Recurrent UTIs. 6. Endometriosis. 7. GERD. 8. History of gestational diabetes. 9. History of neurogenic bladder. 10.Pulmonary embolus in October 2010, anticoagulated for 7 months. 11.Sinus tachycardia.  PAST SURGICAL HISTORY:  Baclofen pump in 2000, colposcopy in 2001, C section x2 in 1997 and 2003, bilateral tubal ligation in 2003.  FAMILY HISTORY:  Noncontributory.  SOCIAL HISTORY:  The patient lives with friend and her daughter.  The patient needs assistance with her ADLs.  She has personal care services about 20 hours a week.  She uses an Passenger transport manager.  The patient did not does not use tobacco, alcohol, or drugs.  PHYSICAL EXAMINATION:  VITAL SIGNS:  Temperature 98.1, pulse 112, respiratory rate 16, blood pressure 138/72, pulse ox 99% on room air. GENERAL:  The patient is well-nourished in no acute distress, alert and cooperative. EYES:  Pupils are equal, round, reactive to light.  Extraocular movements intact. MOUTH:  Oral mucosa and oropharynx without lesions or exudates.  Poor dentition. LUNGS:  Normal respiratory effort. CHEST:  Expands symmetrically.  Lungs are clear to auscultation.  No crackles or wheezes. HEART:  Tachycardic, regular rhythm.  No murmurs, rubs, or gallops. ABDOMEN:  Bowel sounds positive.  Abdomen is soft, nontender without masses, organomegaly, or hernias noted. PULSES:  2+ pulses in all 4 extremities. EXTREMITIES:  Positive calf pain with squeezing in the left calf. Negative pain on the right side. NEUROLOGIC:  The patient is alert and oriented x3.  The patient's speech is difficult to understand secondary to her cerebral palsy.  LABS AND STUDIES:  Basic metabolic panel:  Sodium 139, potassium 3.7, chloride 106, CO2 25, glucose 89, BUN 6, creatinine 0.78, calcium 9.5, protime 13.6, INR 1.02.  CBC:  White blood cell count 10.6, hemoglobin 13.2, hematocrit 38.5, platelets 287,000, normal differential.  RADIOLOGY:  CTA of the  chest is positive for pulmonary emboli and bilateral posterior lower lobe pulmonary artery branches.  ASSESSMENT/PLAN:  This is a 35 year old female who presents with shortness of breath secondary to pulmonary embolus. 1. Pulmonary embolus.  We will start heparin drip tonight.  We will     plan to begin Coumadin per pharmacy protocol..  The patient will likely     need Lovenox bridging after heparin drip until her INR is     therapeutic.  We will give oxygen as needed for shortness of     breath. 2. Cerebral palsy.  We will continue the patient's home neurology     medications, baclofen, Zanaflex, Lamictal, Klonopin. 3. Hypertension.  We will continue the patient's home metoprolol and     Norvasc. 4. Neurogenic bladder.  We will continue the patient's home     oxybutynin. 5. Pain.  We will give oxycodone 5 mg p.o. q.6 p.r.n. for pain. 6. Fluids, electrolytes, and nutrition/gastrointestinal.  We will give     a heart healthy diet and MiraLax as needed. 7. Disposition is pending clinical improvement.    ______________________________ Ardyth Gal, MD   ______________________________ Nestor Ramp, MD    CR/MEDQ  D:  01/01/2011  T:  01/01/2011  Job:  [id number]  Electronically Signed by Ardyth Gal MD on 01/06/2011 12:23:55 PM Electronically Signed by Denny Levy MD on 01/11/2011 09:53:02 AM

## 2011-01-12 ENCOUNTER — Encounter: Payer: Self-pay | Admitting: Family Medicine

## 2011-01-12 DIAGNOSIS — Z86718 Personal history of other venous thrombosis and embolism: Secondary | ICD-10-CM

## 2011-01-12 DIAGNOSIS — Z7901 Long term (current) use of anticoagulants: Secondary | ICD-10-CM | POA: Insufficient documentation

## 2011-01-14 ENCOUNTER — Other Ambulatory Visit: Payer: Self-pay | Admitting: Family Medicine

## 2011-01-14 DIAGNOSIS — I2699 Other pulmonary embolism without acute cor pulmonale: Secondary | ICD-10-CM | POA: Insufficient documentation

## 2011-01-15 NOTE — Telephone Encounter (Signed)
Will fwd. To Dr.Ta

## 2011-01-15 NOTE — Telephone Encounter (Signed)
Can someone check to see if this has been taken care of.  Trying to close out encounters

## 2011-01-15 NOTE — Telephone Encounter (Signed)
Can someone check to see if this has been taken care of?

## 2011-01-16 ENCOUNTER — Other Ambulatory Visit: Payer: Self-pay | Admitting: Family Medicine

## 2011-01-16 NOTE — Telephone Encounter (Signed)
Amlodipine was stopped in hospital until f/u appt with pcp.

## 2011-01-16 NOTE — Telephone Encounter (Signed)
Letter written and routed to have it sent to pt (last week). Rx for hydrocodone sent to pharmacy (last week).

## 2011-01-16 NOTE — Telephone Encounter (Signed)
Please review and refill

## 2011-01-17 ENCOUNTER — Ambulatory Visit (INDEPENDENT_AMBULATORY_CARE_PROVIDER_SITE_OTHER): Payer: Medicaid Other | Admitting: Family Medicine

## 2011-01-17 ENCOUNTER — Encounter: Payer: Self-pay | Admitting: Family Medicine

## 2011-01-17 ENCOUNTER — Ambulatory Visit (INDEPENDENT_AMBULATORY_CARE_PROVIDER_SITE_OTHER): Payer: Medicaid Other | Admitting: *Deleted

## 2011-01-17 VITALS — BP 110/70 | HR 104 | Temp 98.0°F

## 2011-01-17 DIAGNOSIS — Z7901 Long term (current) use of anticoagulants: Secondary | ICD-10-CM

## 2011-01-17 DIAGNOSIS — I2699 Other pulmonary embolism without acute cor pulmonale: Secondary | ICD-10-CM

## 2011-01-17 DIAGNOSIS — L821 Other seborrheic keratosis: Secondary | ICD-10-CM

## 2011-01-17 DIAGNOSIS — Z86718 Personal history of other venous thrombosis and embolism: Secondary | ICD-10-CM

## 2011-01-17 NOTE — Progress Notes (Signed)
Summary: Lovenox   Phone Note Outgoing Call   Call placed by: Markelle Najarian MD,  January 08, 2011 9:19 AM Call placed to: Patient Summary of Call: Was paged by [facility] RN regarding pt's INR.  Nurse states that he did not page me yesterday because he did not get the INR until 6:30pm and he thought that the number he was given was a number for the clinic and not my pager.  He did not call my pager until this morning.  Yesterday INR was 2.6.  He has not taken [name] [name]'s INR yet today.  Instructed him to get INR so that I can dose her Coumadin.  He will call me back today with INR. [facility] RN also stated that pt did not have Lovenox at the home yesterday.  Again he did not page me with this information.  Called [facility] regarding Lovenox.  Spoke with pharmacist.  Lovenox was delivered betweemn 6:40 -6:45pm yesterday.  [name] [name] did not know that Lovenox needed to be there earlier so that RN can administer the dose.  Pharmacist states that today's dose can be delivered by 1pm today.  Called pt.  She states that she received the Lovenox yesterday evening.  [name] [name], caregiver of group home, administered the shot last night.  Pt also requesting that I write a letter to her school stating that she cannot go back to school currently because she is using oxygen and the tank is too big to travel with.  She does have a portable tanks but it only lasts 4 hours and her school is 5-6 hours long.  Told pt that I will write letter for her to be out of school indefinitely.  Initial call taken by: Dj Senteno MD,  January 08, 2011 9:20 AM

## 2011-01-17 NOTE — Patient Instructions (Signed)
Please make appointment for lab check of INR in 2 weeks.  Please continue using oxygen as needed. Make appointment with me as needed.  Please follow the coumadin diet.

## 2011-01-17 NOTE — Miscellaneous (Signed)
Summary: PT is 17.0   Clinical Lists Changes texted md to see if coumadin dose is to be changed. w had rec'd a fax from [place] showing pt of 17.0..Sally [name] RN  January 04, 2011 4:46 PM same dose for today,tomorrow & sunday. will need a reckeck here Monday.[facility] RN  January 04, 2011 4:48 PM spoke with Judeth Cornfield at [facility]. her nurse drew the PT/INR today & will draw it again Monday. told her to continue the same dose of coumadin. called pt & LM to continue same dose of coumadin & [facility] RN will be there monday to draw it again.[facility] RN  January 04, 2011 4:56 PM

## 2011-01-17 NOTE — Progress Notes (Signed)
Summary: waiting for call back/ts   Phone Note Call from Patient Call back at Home Phone [phone]   Reason for Call: Talk to Nurse Summary of Call: pt asking to speak with RN, cannot make out what pt is needing.  Initial call taken by: Knox Royalty,  January 08, 2011 2:16 PM  Follow-up for Phone Call        called pt. lmvm to call back. Follow-up by: Arlyss Repress CMA,,  January 08, 2011 3:50 PM  Additional Follow-up for Phone Call Additional follow up Details #1::        called pt back. documented questions in new system & sent to pcp Additional Follow-up by: [facility] RN,  January 09, 2011 9:50 AM

## 2011-01-17 NOTE — Progress Notes (Signed)
  Subjective:    Patient ID: [name] [name], female    DOB: 25-Aug-1976, 35 y.o.   MRN: [id number]  HPI PULMONARY EMBOLI [name] [name] is here for hospital follow-up of Pulm Emboli.  She has been home for almost 2 weeks where she was bridging Lovenox and coumadin.  She is taking coumadin 5mg  daily and last INR was > 2 last week.  She still complains of dyspnea.  She is using 3L of oxygen by [place].  Dyspnea is worse when she has to take the oxygen off to shower.  She feels very short of breath after showering.  She does not complain of chest pain.   MOLE on BACK She has been there for years.  Since she has been back from the hospital she has noticed that it has become more itchy and sometimes it is tender.  Denies fever/chills, changes in size, bleeding   Review of Systems Denies palpitations, syncope, LE edema, orthopnea, nausea/vomiting, headache, vision change, numbness, weakness, abd pain, diarrhea.  +Cough.  No fever/chills, no hemoptysis.       Objective:   Physical Exam GEN: NAD, alert, appropriate, cooperative throughout exam. Neck: no mases, nodes Resp: CTA b/l, some rhonchi, no wheezing CVS: RRR, no murmur/gallops Abd: +BS, NT/ND, no masses Ext: no edema, cyanosis Neuro: CN ii-xii intact.    Physical Exam    Assessment & Plan:

## 2011-01-17 NOTE — Miscellaneous (Signed)
Summary: Order coumadin  Medications Added WARFARIN SODIUM 5 MG TABS (WARFARIN SODIUM) 1 tab by mouth daily or as directed       Clinical Lists Changes  Medications: Added new medication of WARFARIN SODIUM 5 MG TABS (WARFARIN SODIUM) 1 tab by mouth daily or as directed - Signed Rx of WARFARIN SODIUM 5 MG TABS (WARFARIN SODIUM) 1 tab by mouth daily or as directed;  #30 x 6;  Signed;  Entered by: Angeline Slim MD;  Authorized by: Angeline Slim MD;  Method used: Electronically to [facility] [address], [place], [place]  [id number], Ph: [phone], Fax: [phone]    Prescriptions: WARFARIN SODIUM 5 MG TABS (WARFARIN SODIUM) 1 tab by mouth daily or as directed  #30 x 6   Entered and Authorized by:   Angeline Slim MD   Signed by:   Angeline Slim MD on 01/08/2011   Method used:   Electronically to        [facility] (retail)       [address]       [place], [place]  [id number]       Ph: [phone]       Fax: [phone]   RxID:   [id number]   Appended Document: Order coumadin Spoke with Roseanne Reno.  He will make sure it is delivered today along with her Lovenox.

## 2011-01-18 ENCOUNTER — Encounter: Payer: Self-pay | Admitting: Family Medicine

## 2011-01-18 DIAGNOSIS — L821 Other seborrheic keratosis: Secondary | ICD-10-CM | POA: Insufficient documentation

## 2011-01-18 NOTE — Assessment & Plan Note (Signed)
Pt has history of PE 2010 that was treated with coumadin for 7 months.  She again (01/2011) presented with dyspnea and CTA showed b/l PE.  She will need to be on coumadin for lifetime.  She has been bridging at home with daily [facility] visit for INR check, coumadin administration, and Lovenox administration.  She has been therapeutic on Coumadin 5mg  daily x 1 week.   She still complains of dypsnea and is using about 3L of O2 at home.  Her sats are >96%.  This is a similar presentation to previous episode of PE where pt had normal sats without oxygen but still complained of dyspnea, necessitating oxygen supplementation.  Pt will continue to use O2 as needed.  Will check INR today.

## 2011-01-18 NOTE — Assessment & Plan Note (Signed)
Pt complains of pain from moles on back.  These moles have been there for a while but lately pt has been scratching it and it causes pain.  On exam they look like 2 seborrheic keratosis that seemed to be merging.  Given that pt is being treated for PE with coumadin, I did not want to risk bleeding with shave procedure.  We decided on liquid nitrogen.  Discussed s/e: blister, pain, etc.  Pt agreed to treatment.

## 2011-01-21 ENCOUNTER — Encounter: Payer: Self-pay | Admitting: Family Medicine

## 2011-01-21 ENCOUNTER — Telehealth: Payer: Self-pay | Admitting: Family Medicine

## 2011-01-21 ENCOUNTER — Encounter: Payer: Self-pay | Admitting: *Deleted

## 2011-01-21 ENCOUNTER — Telehealth: Payer: Self-pay | Admitting: *Deleted

## 2011-01-21 NOTE — Telephone Encounter (Signed)
[name] [name] is trying to be released from school and needs a letter stating reasons why from doctor.  In order for [name] [name] to get [name] [name] money back, she needs documentation from doctor

## 2011-01-21 NOTE — Telephone Encounter (Signed)
Called pt and informed, that we will fax her letter to [facility]. Jayme Cloud at [phone] and request fax number.

## 2011-01-21 NOTE — Telephone Encounter (Signed)
Fwd. To Dr. Janalyn Harder for letter.

## 2011-01-21 NOTE — Telephone Encounter (Signed)
Pt calling back, would like her medical condition listed in the letter, wants RN to call her to let her know exactly what MD is writing in the letter.

## 2011-01-21 NOTE — Telephone Encounter (Signed)
I have written letter to her school but did not state her medical condition due to HIPPA.  If pt wants me to indicate the medical condition in the letter then I will write another letter.  When you call pt back, please see letter that was written today 01/21/11. Thank you.

## 2011-01-22 ENCOUNTER — Ambulatory Visit: Payer: Medicaid Other | Admitting: Family Medicine

## 2011-01-23 NOTE — Telephone Encounter (Signed)
Kennon Holter calling, fax # is [phone], please put her attn on fax & have letter on our letterhead.

## 2011-01-23 NOTE — Telephone Encounter (Signed)
Letter faxed.[name] [name], [name] [name]

## 2011-01-28 ENCOUNTER — Telehealth: Payer: Self-pay | Admitting: Family Medicine

## 2011-01-28 NOTE — Telephone Encounter (Signed)
LM for pt that amlodipine was to be d/c unless md sees a need for it again & the metoprolol will be filled on the 3rd of march. Called [facility] [phone] to tell them the same.[name] [name]

## 2011-01-28 NOTE — Telephone Encounter (Signed)
Uses [facility] . States she called them for a refill on her metoprolol & amlodipine & they told her they had been d/c. Told her I will call [facility] & call her back

## 2011-01-28 NOTE — Telephone Encounter (Signed)
Wants to know why her BP meds were discontinued - she is running out and needs to know what to do.

## 2011-01-28 NOTE — Telephone Encounter (Signed)
Norvasc was d/c while in hosp because BP was normal without it.  Pt not to cont this med unless pcp states to.

## 2011-01-28 NOTE — Telephone Encounter (Signed)
[facility] states she got a refill on 01/04/11. The amlodipine was then d/c by Dr. Jeanice Lim when she was recently in the hospital. It is too early for her to get the metoprolol. I will send this to md & will call pt back with this info.

## 2011-01-29 ENCOUNTER — Telehealth: Payer: Self-pay | Admitting: Family Medicine

## 2011-01-29 NOTE — Telephone Encounter (Signed)
Pt states that she needs another note stating when she was in the hospital and why she cannot return to school Needs to be faxed to Colin Rhein - f [phone]

## 2011-01-29 NOTE — Telephone Encounter (Signed)
Patient needs another note for school.

## 2011-01-30 ENCOUNTER — Ambulatory Visit (INDEPENDENT_AMBULATORY_CARE_PROVIDER_SITE_OTHER): Payer: Medicaid Other | Admitting: *Deleted

## 2011-01-30 DIAGNOSIS — I2699 Other pulmonary embolism without acute cor pulmonale: Secondary | ICD-10-CM

## 2011-01-30 DIAGNOSIS — Z7901 Long term (current) use of anticoagulants: Secondary | ICD-10-CM

## 2011-01-30 DIAGNOSIS — Z86718 Personal history of other venous thrombosis and embolism: Secondary | ICD-10-CM

## 2011-01-31 ENCOUNTER — Encounter: Payer: Self-pay | Admitting: Family Medicine

## 2011-02-01 ENCOUNTER — Telehealth: Payer: Self-pay | Admitting: Internal Medicine

## 2011-02-02 ENCOUNTER — Telehealth: Payer: Self-pay | Admitting: Family Medicine

## 2011-02-02 NOTE — Telephone Encounter (Signed)
Pt called emergency line asking for refill on pain mediation.  Told her that we can not refill meds over the emergency line.  Pt states that she is having some chest pain.  Told pt that if she is have chest pain and worsening symptoms than she should consider being evaluated at the ER.  Pt states understanding.

## 2011-02-04 ENCOUNTER — Ambulatory Visit (INDEPENDENT_AMBULATORY_CARE_PROVIDER_SITE_OTHER): Payer: Medicaid Other | Admitting: *Deleted

## 2011-02-04 ENCOUNTER — Ambulatory Visit (INDEPENDENT_AMBULATORY_CARE_PROVIDER_SITE_OTHER): Payer: Medicaid Other | Admitting: Family Medicine

## 2011-02-04 ENCOUNTER — Other Ambulatory Visit: Payer: Self-pay | Admitting: Family Medicine

## 2011-02-04 ENCOUNTER — Emergency Department (HOSPITAL_COMMUNITY): Payer: Medicaid Other

## 2011-02-04 ENCOUNTER — Telehealth: Payer: Self-pay | Admitting: Family Medicine

## 2011-02-04 ENCOUNTER — Encounter: Payer: Self-pay | Admitting: Family Medicine

## 2011-02-04 ENCOUNTER — Inpatient Hospital Stay (HOSPITAL_COMMUNITY)
Admission: EM | Admit: 2011-02-04 | Discharge: 2011-02-06 | DRG: 176 | Disposition: A | Discharge status: Home Health | Payer: Medicaid Other | Attending: Family Medicine | Admitting: Family Medicine

## 2011-02-04 VITALS — BP 110/70 | HR 72 | Temp 97.5°F

## 2011-02-04 DIAGNOSIS — R471 Dysarthria and anarthria: Secondary | ICD-10-CM | POA: Diagnosis present

## 2011-02-04 DIAGNOSIS — R0789 Other chest pain: Secondary | ICD-10-CM

## 2011-02-04 DIAGNOSIS — R0600 Dyspnea, unspecified: Secondary | ICD-10-CM | POA: Insufficient documentation

## 2011-02-04 DIAGNOSIS — I10 Essential (primary) hypertension: Secondary | ICD-10-CM | POA: Diagnosis present

## 2011-02-04 DIAGNOSIS — K219 Gastro-esophageal reflux disease without esophagitis: Secondary | ICD-10-CM | POA: Diagnosis present

## 2011-02-04 DIAGNOSIS — I2699 Other pulmonary embolism without acute cor pulmonale: Principal | ICD-10-CM | POA: Diagnosis present

## 2011-02-04 DIAGNOSIS — F341 Dysthymic disorder: Secondary | ICD-10-CM | POA: Diagnosis present

## 2011-02-04 DIAGNOSIS — Z86718 Personal history of other venous thrombosis and embolism: Secondary | ICD-10-CM

## 2011-02-04 DIAGNOSIS — Z7901 Long term (current) use of anticoagulants: Secondary | ICD-10-CM

## 2011-02-04 DIAGNOSIS — G808 Other cerebral palsy: Secondary | ICD-10-CM

## 2011-02-04 DIAGNOSIS — R0602 Shortness of breath: Secondary | ICD-10-CM

## 2011-02-04 DIAGNOSIS — G809 Cerebral palsy, unspecified: Secondary | ICD-10-CM

## 2011-02-04 DIAGNOSIS — F429 Obsessive-compulsive disorder, unspecified: Secondary | ICD-10-CM | POA: Diagnosis present

## 2011-02-04 DIAGNOSIS — R791 Abnormal coagulation profile: Secondary | ICD-10-CM | POA: Diagnosis present

## 2011-02-04 DIAGNOSIS — M1990 Unspecified osteoarthritis, unspecified site: Secondary | ICD-10-CM | POA: Diagnosis present

## 2011-02-04 DIAGNOSIS — G43909 Migraine, unspecified, not intractable, without status migrainosus: Secondary | ICD-10-CM | POA: Diagnosis present

## 2011-02-04 LAB — BASIC METABOLIC PANEL
BUN: 11 mg/dL (ref 6–23)
Chloride: 105 mEq/L (ref 96–112)
Glucose, Bld: 111 mg/dL — ABNORMAL HIGH (ref 70–99)
Potassium: 4 mEq/L (ref 3.5–5.1)

## 2011-02-04 LAB — CBC
MCH: 27.9 pg (ref 26.0–34.0)
MCV: 84.3 fL (ref 78.0–100.0)
Platelets: 209 10*3/uL (ref 150–400)
RDW: 14.6 % (ref 11.5–15.5)

## 2011-02-04 LAB — DIFFERENTIAL
Eosinophils Absolute: 0.2 10*3/uL (ref 0.0–0.7)
Eosinophils Relative: 3 % (ref 0–5)
Lymphs Abs: 2.7 10*3/uL (ref 0.7–4.0)
Monocytes Relative: 10 % (ref 3–12)

## 2011-02-04 MED ORDER — POLYETHYLENE GLYCOL 3350 17 GM/SCOOP PO POWD: 17.0000 g | ORAL | Status: DC | PRN

## 2011-02-04 MED ORDER — IOHEXOL 300 MG/ML  SOLN
100.0000 mL | Freq: Once | INTRAMUSCULAR | Status: AC | PRN
  Administered 2011-02-04: 100 mL via INTRAVENOUS

## 2011-02-04 MED ORDER — METOPROLOL SUCCINATE ER 100 MG PO TB24: 100.0000 mg | ORAL_TABLET | Freq: Every day | ORAL | Status: DC

## 2011-02-04 NOTE — Progress Notes (Signed)
  Subjective:    Patient ID: [name] [name], female    DOB: [birth year], 35 y.o.   MRN: [id number]  HPI Dyspnea: Pt feels out of breath this morning.  She is using oxygen 2L with sats 99%.  She states that she cannot move from the shower to the toilet without oxygen because she starts to feel dyspneic. She was without oxygen for about 45 minutes.  It takes 15-20 minutes.  She has CNA to help her get dressed, which takes an additional 20 minutes.   Coumadin 7.5mg  Mon, Wed, Fri; 5mg  Tues, Thurs, Sat, Sun.  Last INR check was 1.4 on 01/30/11.    Feeling some chest pain, which feels like knife stabbing, which does not occur everyday.  Today she was having some chest pain, which has been present all day.    Review of Systems  no nausea, vomiting, abd pain, syncope, palpitations, weakness, numbness  Objective:   Physical Exam INR 1.6 today in clinic  General:  Well-developed,well-nourished,in no acute distress; alert,appropriate and cooperative throughout examination Eyes:  No corneal or conjunctival inflammation noted. EOMI. Perrla. Mouth:  Oral mucosa and oropharynx without lesions or exudates.  poor dentition.  Lungs:  Normal respiratory effort, chest expands symmetrically. Lungs are clear to auscultation, no crackles or wheezes. Heart:  Tachycardic, regular rhythm, no m/r/g.  Abdomen:  Bowel sounds positive,abdomen soft and non-tender without masses, organomegaly or hernias noted. Pulses:  2+ pulses in all four extremities.  Extremities:  R calf vs L calf: 1.5 cm difference  Neurologic:  alert & oriented X3.  Pt. speech sometimes difficult to understand secondary to CP.     Assessment & Plan:

## 2011-02-04 NOTE — H&P (Signed)
Family Medicine Teaching [facility] Admission History and Physical  Patient name: [name] [name] Medical record number: [id number] Date of birth: 1976/02/12 Age: 35 y.o. Gender: female  Primary Care Provider: Angeline Slim, MD  Chief Complaint: Chest pain History of Present Illness: [name] [name] is a 35 y.o. year old female with CP who was recently discharged after being treated for PE.  Pt. Returns to [facility] after being sent from [facility] with increased chest pain, shortness of breath with a subtherapeutic INR.  Her sob and chest pain have gradually been worsening over the past week.  She describes the pain as sharp and stabbing and worse with deep inspiration.  Her pain is worsened when she is off of her oxygen such as when she is taking a shower.  She denies palpitations, radiation of pain, headache, hemoptysis.  Pain not made worse or better with activity change.    She has had occasional nausea and fatigue which she states is normal for her.   Her INR has been subtherapeutic and her coumadin was increased last week however she has remained sub therapeutic.  She states she takes her medicine as directed.  While in the ed she does have an increased O2 requirement of 4L up from her baseline of 2L.    Patient Active Problem List  Diagnoses  . DEPRESSION, MAJOR, RECURRENT  . OBSESSIVE COMPUL. DISORDER  . CEREBRAL PALSY  . HYPERTENSION, BENIGN  . DYSFUNCTIONAL UTERINE BLEEDING  . CONTRACTURE OF JOINT OF MULTIPLE SITES  . WRIST PAIN, LEFT  . Pulmonary embolism  . Cerebral palsy  . Encounter for long-term (current) use of anticoagulants  . Seborrheic keratosis  . Dyspnea   Past Medical History: Past Medical History  Diagnosis Date  . Cerebral palsy   . Pulmonary embolism     Lifetime Coumadin  . Cerebral palsy     Spastic Cerebral palsy, mentally intact  . Contracture, joint, multiple sites     Electric wheelchair, uses left hand to operate chair.   [name] Kitchen Hypertension   . OCD (obsessive  compulsive disorder)   . Depression   . Migraine   . [name]   . Dysarthria   . Endometriosis   . History of recurrent UTIs   . GERD (gastroesophageal reflux disease)   . Pulmonary embolism 2011, 01/2011    Will be on lifetime coumadin    Past Surgical History: Past Surgical History  Procedure Date  . Colposcopy 06/2000  . Cesarean section   . Tubal ligation 2003  . Carpal tunnel release 08/2008    Dr Teressa Senter  . Wrist surgery 06/2010    Dr Dierdre Searles, hand surgeon, [name]    Social History: History   Social History  . Marital Status: Divorced    Spouse Name: N/A    Number of Children: N/A  . Years of Education: N/A   Social History Main Topics  . Smoking status: Never Smoker   . Smokeless tobacco: Never Used  . Alcohol Use: No  . Drug Use: No  . Sexually Active: No   Other Topics Concern  . Not on file   Social History Narrative   Pt lives in the home of a caregiver.  Lives there with her daughter and 7 other people.  Caregiver accompanies [name] [name] to appt.  [name] [name] uses [employer]. Ailene Ards, who has schizophrenia, MR, is father of daughter.  Homero Fellers and pt are no longer together.  Homero Fellers does not see [name] [name]. Daughter is 29 yo, Stage manager,  lives with pt.  [name] [phone]), lives with pt's parents.Needs assistance with ADL's and IADL's--has personal care services 20 hrs/wk; No tobacco, EtOH, drugs. Patient very concerned about her kids being taken from her.    Family History: No family history on file.  Allergies: No Known Allergies  No current facility-administered medications for this visit.   Current Outpatient Prescriptions  Medication Sig Dispense Refill  . albuterol (PROVENTIL) (2.5 MG/3ML) 0.083% nebulizer solution 2.5mg -5mg  inhaled by nebulizer every 4 hours as needed for shortness of breath. Dispense 1.       . baclofen (LIORESAL) 10 MG tablet Take 10 mg by mouth 4 (four) times daily.        . clonazePAM (KLONOPIN) 1 MG tablet Per Dr  Sharene Skeans       . diazepam (VALIUM) 5 MG tablet Take 5 mg by mouth. One by mouth 1 hour prior to procedure and may repeat one tab at time of procedure.       [name] Kitchen HYDROcodone-acetaminophen (NORCO) 5-325 MG per tablet Take 1 tablet by mouth every 4 (four) hours as needed for Pain.  20 tablet  0  . ibuprofen (ADVIL,MOTRIN) 600 MG tablet Take 600 mg by mouth. 1 tab by mouth three times a day as needed for pain.       [name] Kitchen lamoTRIgine (LAMICTAL) 150 MG tablet 150 mg. Per Dr Sharene Skeans.       . metoprolol (TOPROL-XL) 100 MG 24 hr tablet Take 1 tablet (100 mg total) by mouth daily.  30 tablet  6  . oxybutynin (DITROPAN) 5 MG tablet Take 5 mg by mouth 3 (three) times daily. For overactive bladder.       . polyethylene glycol (MIRALAX) powder Take 17 g by mouth as needed.  255 g  6  . QUEtiapine (SEROQUEL) 200 MG tablet Take 200 mg by mouth at bedtime.        [name] Kitchen Respiratory Therapy Supplies (NEBULIZER/ADULT MASK) KIT Use as directed with Neb machine. Dispense 1       . Spacer/Aero-Holding Rudean Curt To use with MDI       . SUMAtriptan (IMITREX) 50 MG tablet 50 mg. 1 tab by mouth at onset of headache. Can repeat in 2 hours if headache perssits. Max 200mg  (4 tabs per day)       . tizanidine (ZANAFLEX) 2 MG capsule Per Dr Sharene Skeans.       . warfarin (COUMADIN) 5 MG tablet Take 5 mg by mouth as directed.        . warfarin (COUMADIN) 5 MG tablet Take 1 tablet (5 mg total) by mouth daily.  30 tablet  11  . DISCONTD: amLODipine (NORVASC) 5 MG tablet Take 5 mg by mouth daily.        [name] Kitchen DISCONTD: metoprolol (TOPROL-XL) 100 MG 24 hr tablet Take 100 mg by mouth daily.        [name] Kitchen DISCONTD: polyethylene glycol (MIRALAX) powder Take 17 g by mouth as needed.         Facility-Administered Medications Ordered in Other Visits  Medication Dose Route Frequency Provider Last Rate Last Dose  . iohexol (OMNIPAQUE) 300 MG/ML injection 100 mL  100 mL Intravenous Once PRN Medication Radiologist   100 mL at 02/04/11 2011   Review Of  Systems: Per HPI  Otherwise 12 point review of systems was performed and was unremarkable.  Physical Exam: Pulse: 70  Blood Pressure: 104/68 RR: 18   O2: 99% on 4L Temp: 98.0  General: alert and no  distress HEENT: PERRLA, extra ocular movement intact, sclera clear, anicteric and neck supple with midline trachea Heart: S1, S2 normal, no murmur, rub or gallop, regular rate and rhythm Lungs: clear to auscultation, no wheezes or rales and unlabored breathing Abdomen: abdomen is soft without significant tenderness, masses, organomegaly or guarding Extremities: Right calf with mild swelling, denies TTP.  Left calf normal.   Skin:no rashes, no ecchymoses Neurology: Decreased motor and coordination in upper and lower extremities 2/2 to CP, Speech difficult to understand but believed to be at baseline.   Labs and Imaging: Lab Results  Component Value Date/Time   NA 136 02/04/2011  7:08 PM   K 4.0 02/04/2011  7:08 PM   CL 105 02/04/2011  7:08 PM   CO2 28 02/04/2011  7:08 PM   BUN 11 02/04/2011  7:08 PM   CREATININE 0.74 02/04/2011  7:08 PM   GLUCOSE 111* 02/04/2011  7:08 PM   Lab Results  Component Value Date   WBC 6.3 02/04/2011   HGB 12.6 02/04/2011   HCT 38.1 02/04/2011   MCV 84.3 02/04/2011   PLT 209 02/04/2011   Lab Results  Component Value Date   INR 1.46 02/04/2011   CTA of Chest  1.  Stable appearance of bilateral pulmonary emboli since   01/10/2011.   2.  No new abnormalities noted.   Assessment and Plan: Acire Tang is a 35 y.o. year old female with history of cp recently discharged after tx for PE 1. Chest pain/SOB:  Likely 2/2 to PE.  Repeat CTA shows no increased clot burden, however patient is non-therapeutic on her coumadin. Will admit and restart lovenox to bridge until INR is therapeutic again.  Will follow INR.  Does not have anyone to administer lovenox at home so will likely need home health when time comes for d/c planning.  Chest pain does not sound cardiac in origin and EKG with no  change from previous, so will elect to not get cardiac enzymes at this time. 2.  Cerebral palsy:  Will continue home medications for spasticity 3.  Depression:  Will continue home meds 4.  Chronic pain:  Continue home medication regmien. 2. FEN/GI: Regular diet, SLIV 3. Prophylaxis: On lovenox & coumadin 4. Disposition: Pending clinical improvement 5. Code:  Full

## 2011-02-04 NOTE — Assessment & Plan Note (Signed)
Pt has bilateral PT 01/2011. She was subtherapeutic with INR last Thurs at 1.4 and 1.6 today.

## 2011-02-04 NOTE — Assessment & Plan Note (Signed)
DDx for dyspnea include expansion of PE or ACS.  Pt is subtherapeutic on INR today and there is a difference in her calf measurement of 1.5 cm.  We will send pt to ER to be evaluated.  Likely she will need a CTA.  Pt will be traveling by [place].

## 2011-02-04 NOTE — Assessment & Plan Note (Signed)
Spastic cerebral palsy.  Mentally intact.

## 2011-02-04 NOTE — Telephone Encounter (Signed)
Pt could not make appt today, wants to know if MD could refill pain med, at end of conversation pt said she would still try to make it today but wasn't sure if she would get here in time.

## 2011-02-05 LAB — CBC
HCT: 37.3 % (ref 36.0–46.0)
Hemoglobin: 12 g/dL (ref 12.0–15.0)
RDW: 14.6 % (ref 11.5–15.5)
WBC: 6.5 10*3/uL (ref 4.0–10.5)

## 2011-02-05 LAB — BASIC METABOLIC PANEL
Calcium: 8.9 mg/dL (ref 8.4–10.5)
Chloride: 108 mEq/L (ref 96–112)
Creatinine, Ser: 0.75 mg/dL (ref 0.4–1.2)
GFR calc Af Amer: >60 mL/min (ref >60)
GFR calc non Af Amer: >60 mL/min (ref >60)

## 2011-02-05 LAB — POCT CARDIAC MARKERS
CKMB, poc: 1.5 ng/mL (ref 1.0–8.0)
Troponin i, poc: <0.05 ng/mL (ref 0.00–0.09)

## 2011-02-05 LAB — PROTIME-INR
INR: 1.59 — ABNORMAL HIGH (ref 0.00–1.49)
Prothrombin Time: 19.1 seconds — ABNORMAL HIGH (ref 11.6–15.2)

## 2011-02-06 LAB — PROTIME-INR
INR: 1.77 — ABNORMAL HIGH (ref 0.00–1.49)
Prothrombin Time: 20.8 seconds — ABNORMAL HIGH (ref 11.6–15.2)

## 2011-02-07 ENCOUNTER — Ambulatory Visit (INDEPENDENT_AMBULATORY_CARE_PROVIDER_SITE_OTHER): Payer: Medicaid Other | Admitting: *Deleted

## 2011-02-07 DIAGNOSIS — Z7901 Long term (current) use of anticoagulants: Secondary | ICD-10-CM

## 2011-02-07 DIAGNOSIS — I2699 Other pulmonary embolism without acute cor pulmonale: Secondary | ICD-10-CM

## 2011-02-07 DIAGNOSIS — Z86718 Personal history of other venous thrombosis and embolism: Secondary | ICD-10-CM

## 2011-02-07 NOTE — Progress Notes (Signed)
Summary: pharmcist has question on meds   Phone Note From Pharmacy Call back at [phone]   Caller: [facility] Request: Update Allergy List Summary of Call: parmcist has question re pt blood pressure meds. Initial call taken by: Roe Coombs,  February 01, 2011 2:56 PM  Follow-up for Phone Call        pharmacist states they received mess to hold all BP meds but pt called requesting refill on toprol, advised we have not seen pt in a year they need to call pcp she is agreeable Meredith Staggers, RN  February 01, 2011 3:33 PM

## 2011-02-08 ENCOUNTER — Ambulatory Visit: Payer: Medicaid Other

## 2011-02-11 ENCOUNTER — Telehealth: Payer: Self-pay | Admitting: Family Medicine

## 2011-02-11 ENCOUNTER — Telehealth: Payer: Self-pay | Admitting: *Deleted

## 2011-02-11 NOTE — Telephone Encounter (Signed)
Nurse Nena Jordan will do Protime/INR at home visit today. Ok'd verbal order. She will call back with results. Arlyss Repress

## 2011-02-11 NOTE — Telephone Encounter (Signed)
Called HHRN back.  Cont current dose discussed with Kendal Hymen last week.  Will recheck at [facility] Fri.  RN to recheck next Mon 02/18/11.  Stop Lovenox.

## 2011-02-11 NOTE — Telephone Encounter (Signed)
PT - 24.8 INR - 2.1  Takes 7.5mg  coumadin everyday except Thurs, takes 10mg  She took her last does of Lovenox this morning.

## 2011-02-11 NOTE — Telephone Encounter (Signed)
See previous message. [name] [name]

## 2011-02-12 LAB — POCT I-STAT, CHEM 8
Calcium, Ion: 1.16 mmol/L (ref 1.12–1.32)
Creatinine, Ser: 0.8 mg/dL (ref 0.4–1.2)
Glucose, Bld: 102 mg/dL — ABNORMAL HIGH (ref 70–99)
HCT: 41 % (ref 36.0–46.0)
Hemoglobin: 13.9 g/dL (ref 12.0–15.0)
Potassium: 3.7 mEq/L (ref 3.5–5.1)
TCO2: 24 mmol/L (ref 0–100)

## 2011-02-12 NOTE — H&P (Signed)
NAMESCHERRIE, SENECA NO.:  [id number]  MEDICAL RECORD NO.:  [id number]           PATIENT TYPE:  E  LOCATION:  [facility]                         FACILITY:  [facility]  PHYSICIAN:  Nestor Ramp, MD        DATE OF BIRTH:  08/20/1976  DATE OF ADMISSION:  02/04/2011 DATE OF DISCHARGE:                             HISTORY & PHYSICAL   PRIMARY CARE PROVIDER:  Angeline Slim, MD from [facility].  CHIEF COMPLAINTS:  Chest pain.  HISTORY OF PRESENT ILLNESS:  The patient is a 35 year old female with cerebral palsy who was recently discharged after being treated for PE. The patient returns to [facility] ED after being sent from [facility] with increased chest pain, shortness of breath with a subtherapeutic INR.  Her shortness of breath and chest pain have gradually been worsening over the past week.  She describes the pain is sharp and stabbing and worse with deep inspiration.  Her pain is worsened when she is off of her oxygen such as when she is taking a shower.  Denies palpitations, radiation of pain, headache, hemoptysis. Pain is not made worse or better with activity change.  She has had occasional nausea and fatigue, which she states is normal for her.  Her INR has been subtherapeutic and her Coumadin was increased last week; however, she has remained subtherapeutic.  She states she takes her medicine as directed.  While in the emergency department, she does have an increased oxygen requirement of 4 L from her baseline of 2 liters.  PAST MEDICAL HISTORY: 1. Cerebral palsy. 2. Pulmonary embolus x2. 3. Contractures. 4. Hypertension. 5. Obsessive-compulsive disorder. 6. Depression. 7. Migraine. 8. Degenerative joint disease. 9. Dysarthria. 10.Endometriosis. 11.History of recurrent UTIs. 12.Gastroesophageal reflux disease.  PAST SURGICAL HISTORY: 1. Colposcopy in 2001. 2. C-section. 3. Tubal ligation in 2003. 4. Carpal tunnel release in  2009. 5. Wrist surgery in 2011.  SOCIAL HISTORY:  She is divorced.  She has never smoked or use smokeless tobacco.  She lives at home with a caregiver.  She does use an electric wheelchair to get around.  She does require assistance for her ADLs and IADLs and as personal care, serves 20 hours a week.  She does not use tobacco, alcohol, or drugs.  FAMILY HISTORY:  Noncontributory.  ALLERGIES:  No known drug allergies.  MEDICATIONS: 1. Albuterol 2.5-5 mg inhaled by nebulizer q.4 h p.r.n. 2. Baclofen 10 mg p.o. b.i.d. 3. Clonazepam 1 tab p.o. b.i.d. 4. Oxycodone 5 mg q.6 h p.r.n. 5. Ibuprofen 600 mg 1 tablet p.o. t.i.d. for pain. 6. Lamictal 150 mg. 7. Oxybutynin 5 mg 1 q.a.m., 2 q.p.m. 8. MiraLax 17 g p.o. p.r.n. 9. Seroquel 20 mg p.o. nightly. 10.Imitrex 50 mg 1 tablet p.o. at onset of headache. 11.Zanaflex 2 mg. 12.Coumadin. 13.Tramadol 50 mg 1-2 tablets p.o. q.6 h p.r.n.  PHYSICAL EXAMINATION:  VITAL SIGNS:  Pulse 70, blood pressure 104/68, respirations 18, O2 is 99% on 4 L, temperature is 98.0. GENERAL:  She is alert and in no acute distress. HEENT:  Pupils equal, round, and reactive to light.  Extraocular movements intact.  Sclerae are clear and anicteric. NECK:  Supple with midline trachea. HEART:  S1 and S2, which were normal.  No murmur, rub, or gallop. Regular rate and rhythm. LUNGS:  Clear to auscultation bilaterally.  No wheezes or rales. ABDOMEN:  Soft without tenderness, masses, organomegaly. EXTREMITIES:  Right calf with mild swelling.  Denies tenderness to palpation.  Left calf is normal. SKIN:  With no rashes or ecchymosis. NEUROLOGIC:  Decreased motor and coordination in upper and lower extremity secondary to CP.  Speech is difficult to understand, but believe to be at baseline.  LABORATORIES AND IMAGING:  Basic metabolic panel:  Sodium 136, potassium 4.0, chloride 105, bicarb 28, creatinine 0.74, BUN of 11, glucose 111. CBC: WBC 6.3, hemoglobin 12.6,  hematocrit 38.1, platelets of 209.  INR was 1.46.  CT angiogram of the chest showed, impression: 1. Stable appearance of bilateral pulmonary emboli since January 10, 2011. 2. No new abnormalities noted.  ASSESSMENT AND PLAN:  This is a 35 year old female with history of cerebral palsy, recently discharged after treatment for pulmonary embolism. 1. Chest pain/shortness of breath, likely secondary to pulmonary     embolism.  Repeat CTA shows no increased clot burden, however, the     patient is nontherapeutic, on her Coumadin.  We will admit and     restart Lovenox which reach until INR is therapeutic again.  We     will follow INR.  Does not have anyone to administer Lovenox at     home, so we will likely need home health when time comes for     discharge planning.  Chest pain does not sound cardiac in origin     and EKG with no change from previous, so we would like not to get     cardiac enzymes at this time. 2. Cerebral palsy.  We will continue home medications for her     spasticity. 3. Depression.  We will continue home meds. 4. Chronic pain.  Continue home medication regimen. 5. Fluids, electrolytes, nutrition/gastrointestinal.  Regular diet and     saline lock IV. 6. Prophylaxis, on Lovenox and Coumadin. 7. Disposition pending clinical improvement. 8. Code.  Full code.    ______________________________ Everrett Coombe, MD   ______________________________ Nestor Ramp, MD    CM/MEDQ  D:  02/05/2011  T:  02/05/2011  Job:  [id number]  Electronically Signed by Everrett Coombe MD on 02/07/2011 11:59:54 PM Electronically Signed by Denny Levy MD on 02/12/2011 09:00:55 AM

## 2011-02-13 LAB — CBC
HCT: 38.4 % (ref 36.0–46.0)
Hemoglobin: 12.7 g/dL (ref 12.0–15.0)
MCH: 27.9 pg (ref 26.0–34.0)
MCV: 84.2 fL (ref 78.0–100.0)
RBC: 4.56 MIL/uL (ref 3.87–5.11)

## 2011-02-13 LAB — DIFFERENTIAL
Eosinophils Absolute: 0.2 10*3/uL (ref 0.0–0.7)
Eosinophils Relative: 3 % (ref 0–5)
Lymphs Abs: 2.6 10*3/uL (ref 0.7–4.0)
Monocytes Relative: 9 % (ref 3–12)
Neutrophils Relative %: 52 % (ref 43–77)

## 2011-02-13 LAB — POCT URINALYSIS DIPSTICK
Nitrite: NEGATIVE
Protein, ur: 30 mg/dL — AB
Urobilinogen, UA: 0.2 mg/dL (ref 0.0–1.0)
pH: 6 (ref 5.0–8.0)

## 2011-02-14 ENCOUNTER — Ambulatory Visit: Payer: Medicaid Other

## 2011-02-14 LAB — POCT URINALYSIS DIPSTICK
Bilirubin Urine: NEGATIVE
Glucose, UA: NEGATIVE mg/dL
Nitrite: NEGATIVE
Specific Gravity, Urine: <=1.005 (ref 1.005–1.030)

## 2011-02-14 LAB — CBC
HCT: 37.6 % (ref 36.0–46.0)
Hemoglobin: 12.5 g/dL (ref 12.0–15.0)
MCH: 27.1 pg (ref 26.0–34.0)
MCV: 83.7 fL (ref 78.0–100.0)
MCV: 82.9 fL (ref 78.0–100.0)
RBC: 4.62 MIL/uL (ref 3.87–5.11)
RDW: 15.3 % (ref 11.5–15.5)
WBC: 7.2 10*3/uL (ref 4.0–10.5)
WBC: 8.3 10*3/uL (ref 4.0–10.5)

## 2011-02-14 LAB — POCT I-STAT, CHEM 8
BUN: 11 mg/dL (ref 6–23)
Creatinine, Ser: 0.6 mg/dL (ref 0.4–1.2)
Glucose, Bld: 112 mg/dL — ABNORMAL HIGH (ref 70–99)
Hemoglobin: 14.6 g/dL (ref 12.0–15.0)
Potassium: 3.7 mEq/L (ref 3.5–5.1)

## 2011-02-14 LAB — URINALYSIS, ROUTINE W REFLEX MICROSCOPIC
Protein, ur: NEGATIVE mg/dL
Urobilinogen, UA: 1 mg/dL (ref 0.0–1.0)

## 2011-02-14 LAB — DIFFERENTIAL
Basophils Absolute: 0 10*3/uL (ref 0.0–0.1)
Basophils Relative: 0 % (ref 0–1)
Eosinophils Absolute: 0.2 10*3/uL (ref 0.0–0.7)
Lymphocytes Relative: 35 % (ref 12–46)
Lymphs Abs: 2.9 10*3/uL (ref 0.7–4.0)
Monocytes Relative: 8 % (ref 3–12)
Monocytes Relative: 9 % (ref 3–12)
Neutro Abs: 4.3 10*3/uL (ref 1.7–7.7)
Neutrophils Relative %: 61 % (ref 43–77)
Neutrophils Relative %: 54 % (ref 43–77)

## 2011-02-14 LAB — COMPREHENSIVE METABOLIC PANEL
Alkaline Phosphatase: 77 U/L (ref 39–117)
BUN: 8 mg/dL (ref 6–23)
Glucose, Bld: 166 mg/dL — ABNORMAL HIGH (ref 70–99)
Potassium: 3.2 mEq/L — ABNORMAL LOW (ref 3.5–5.1)
Total Bilirubin: 0.4 mg/dL (ref 0.3–1.2)
Total Protein: 6.9 g/dL (ref 6.0–8.3)

## 2011-02-14 LAB — URINE CULTURE

## 2011-02-14 LAB — SEDIMENTATION RATE: Sed Rate: 2 mm/hr (ref 0–22)

## 2011-02-15 ENCOUNTER — Ambulatory Visit (INDEPENDENT_AMBULATORY_CARE_PROVIDER_SITE_OTHER): Payer: Medicaid Other | Admitting: Family Medicine

## 2011-02-15 ENCOUNTER — Ambulatory Visit (INDEPENDENT_AMBULATORY_CARE_PROVIDER_SITE_OTHER): Payer: Medicaid Other | Admitting: *Deleted

## 2011-02-15 ENCOUNTER — Encounter: Payer: Self-pay | Admitting: Family Medicine

## 2011-02-15 DIAGNOSIS — R0600 Dyspnea, unspecified: Secondary | ICD-10-CM

## 2011-02-15 DIAGNOSIS — I10 Essential (primary) hypertension: Secondary | ICD-10-CM

## 2011-02-15 DIAGNOSIS — I2699 Other pulmonary embolism without acute cor pulmonale: Secondary | ICD-10-CM

## 2011-02-15 DIAGNOSIS — N949 Unspecified condition associated with female genital organs and menstrual cycle: Secondary | ICD-10-CM

## 2011-02-15 DIAGNOSIS — Z86718 Personal history of other venous thrombosis and embolism: Secondary | ICD-10-CM

## 2011-02-15 DIAGNOSIS — M2450 Contracture, unspecified joint: Secondary | ICD-10-CM

## 2011-02-15 DIAGNOSIS — R0609 Other forms of dyspnea: Secondary | ICD-10-CM

## 2011-02-15 DIAGNOSIS — G809 Cerebral palsy, unspecified: Secondary | ICD-10-CM

## 2011-02-15 DIAGNOSIS — Z7901 Long term (current) use of anticoagulants: Secondary | ICD-10-CM

## 2011-02-15 LAB — POCT INR: INR: 1.3

## 2011-02-15 LAB — GLUCOSE, CAPILLARY: Glucose-Capillary: 99 mg/dL (ref 70–99)

## 2011-02-15 NOTE — Assessment & Plan Note (Signed)
Multiple pain sites. Pt has had pain for many years.  She was on chronic Darvocet until it was taken off market.  We have agreed to continue Hydrocodone 5-325 mg for #15 tablets per month.  We will not increase from this.  We may try to decrease to #10 tablets after TAH because pt is also having menstrual pain.

## 2011-02-15 NOTE — Assessment & Plan Note (Signed)
Pt still requiring oxygen supplementation.  Although her sat is 100% on 3L, and likely more than 92% on RA, pt feels very uncomfortable without oxygen.  She had similar experience the last time she had a PE, when she was very dyspneic without oxygen.  She has been trying to wean herself off of oxygen.  We will continue to offer oxygen support until pt feels comfortable.  In the meantime, I have written a Rx for bedside spirometry for pt to use at home.  I have also showed pt how to exercise her lungs by taking a deep breath and counting to 5 and work up to 10, to mimic the use of spirometry.

## 2011-02-15 NOTE — Assessment & Plan Note (Addendum)
Will Rx a Grab Bar for the patient to assist with getting up from the toilet.

## 2011-02-15 NOTE — Progress Notes (Signed)
  Subjective:    Patient ID: [name] [name], female    DOB: 1976/11/12, 35 y.o.   MRN: [id number]  HPI Pulmonary Emboli: Pt feels better than when she was hospitalized.  She states that she is trying to get off of oxygen supplementation.  She feels dyspneic with exertion, for example taking bath/shower.  She does use oxygen to sleep sometimes. She keeps the oxygen close by in case she needs it.   On Monday (5 days ago) INR was 2.1, which was checked by [facility].    Chronic Pain: she would like have hydrocodone for pain in left hand and abd cramping with menses.  She plans to have a TAH next month, by [facility]  Until then, she needs Hydrocodone 5mg .    CP: She needs a Rx for grab bar that will be installed into the bathroom wall to help pt get up from the toilet.    Past Medical History:  1. migraines?--tx`d successfully w/ Imitrex (3/06) and prophylaxic by neuro 2. 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 DJD 4. Dysarthria with mild dysphagia 5. recurren UTI's  6. Endometriosis 7. GERD 8. H/o neurogenic bladder 9. PE-09/2009-anticoagulated for 7 months with Coumadin (d/c 04/2010). 10. PE 12/2010 bilateral PE.  Will be on chronic coumadin 11. Altered mental status 2/2 hypoventilation from anxiety (01/2010, [facility])  Past Surgical History:  -Baclofen pump insertion  8/00 -C-section x 2:  1997 & 2003 -BTL 2003 -I&D of right LE MRSA abscess - 02/27/2005,  -Laproscopic excision of endometriosis and laproscopic uterosacral nerve ablation (LUNA) procedure 10/2001 -Left Carpal Tunnel Ligament Release 08/2008 by Dr Teressa Senter -Oral surgery for 10 teeth extraction 07/18/09 -EEG 01/2010 by Dr Sharene Skeans: normal, no seizure activities, 6 events that occurred were preceded by hypoventitation  -L wrist surgery by Dr Ileene Rubens, 06/14/10  Family History:  No DM, CAD, HTN.  One GM d/o colon CA, the other of breast CA.  Social History: Lives in a home owned by [place].  There are  two other people in the home who uses wheelchair.  [name] [name] accompanies Carrah to appts sometimes.  Daughter, [name] [name] (8 y/o) lives with pt.  Pt has another son, [name] [name] (1997) who lives with pt's parents since 2005.   Needs assistance with ADL's and IADL's--has personal care services 20 hrs/wk;  Uses electric wheelchair. No tobacco, EtOH, drugs. Patient very concerned about her kids being taken from her  Review of Systems Per hpi     Objective:   Physical Exam  General:  Well-developed,well-nourished,in no acute distress; alert,appropriate and cooperative throughout examination.  Sitting in wheelchair.  Vitals reviewed. Eyes:  No corneal or conjunctival inflammation noted. EOMI. Perrla. Mouth:  Oral mucosa and oropharynx without lesions or exudates.  poor dentition.  Lungs:  Normal respiratory effort, chest expands symmetrically. Lungs are clear to auscultation, no crackles or wheezes. Heart:  Regular rate, regular rhythm, no m/r/g.  Abdomen:  Bowel sounds positive,abdomen soft and non-tender without masses, organomegaly or hernias noted. EXT: no edema, +2 pulses        Assessment & Plan:

## 2011-02-15 NOTE — Assessment & Plan Note (Signed)
INR was 2.1 on 01/13/11.  Today INR is 1.3.  She is taking Coumadin 10mg  twice a week and 7.5mg  on other days.  Will change to 10mg  daily for today, Sat, and Sun.  HHRN will come to pt's home on Mon to check INR.  I will dose her coumadin at that time.

## 2011-02-15 NOTE — Patient Instructions (Signed)
Please follow up in one month with Dr Janalyn Harder. Please make appt with lab for next Fri for INR.

## 2011-02-15 NOTE — Assessment & Plan Note (Addendum)
Pt was on Norvasc 5mg  along with metoprolol 50mg  XL.  While hospitalized in Jan-Feb her BPs were in the 100s-110s and Norvasc was stopped.  Will will continue to monitor BP off of Norvasc.  BP today 100/78.  We may be able to decrease dose of metoprolol also. Will f/u in 1 month.

## 2011-02-15 NOTE — Assessment & Plan Note (Signed)
Pt has severe menstrual pain.  We are treating this with hydrocodone 5-325 (#15).  Pt plans to have TAH in April, will be done at [facility].  Pt has BTL, so this is not for birth control.  We have tried ocp and depo in the past, but with history of PE, we will avoid estsrogen.

## 2011-02-16 ENCOUNTER — Telehealth: Payer: Self-pay | Admitting: Sports Medicine

## 2011-02-16 NOTE — Telephone Encounter (Signed)
Pt very difficult to understand but sounds like she missed last night's dose of coumadin.  Asking what to do. Noted that INR was 1.3, should be ok 10mg  daily. Advised take her 10mg  dose now, then go back to 10mg  PO in the evenings. INR sched to be rechecked Monday.

## 2011-02-18 ENCOUNTER — Observation Stay (HOSPITAL_COMMUNITY)
Admission: EM | Admit: 2011-02-18 | Discharge: 2011-02-21 | Disposition: A | Discharge status: Nursing Home (Includes ICF) | Payer: Medicaid Other | Attending: Family Medicine | Admitting: Family Medicine

## 2011-02-18 ENCOUNTER — Emergency Department (HOSPITAL_COMMUNITY): Payer: Medicaid Other

## 2011-02-18 ENCOUNTER — Inpatient Hospital Stay (INDEPENDENT_AMBULATORY_CARE_PROVIDER_SITE_OTHER)
Admission: RE | Admit: 2011-02-18 | Discharge: 2011-02-18 | Disposition: A | Discharge status: Home / Self Care | Payer: Medicaid Other | Source: Ambulatory Visit | Attending: Family Medicine | Admitting: Family Medicine

## 2011-02-18 ENCOUNTER — Encounter: Payer: Self-pay | Admitting: Family Medicine

## 2011-02-18 DIAGNOSIS — G43909 Migraine, unspecified, not intractable, without status migrainosus: Secondary | ICD-10-CM | POA: Insufficient documentation

## 2011-02-18 DIAGNOSIS — I2699 Other pulmonary embolism without acute cor pulmonale: Secondary | ICD-10-CM

## 2011-02-18 DIAGNOSIS — Z993 Dependence on wheelchair: Secondary | ICD-10-CM | POA: Insufficient documentation

## 2011-02-18 DIAGNOSIS — R0989 Other specified symptoms and signs involving the circulatory and respiratory systems: Secondary | ICD-10-CM

## 2011-02-18 DIAGNOSIS — M1990 Unspecified osteoarthritis, unspecified site: Secondary | ICD-10-CM | POA: Insufficient documentation

## 2011-02-18 DIAGNOSIS — R0602 Shortness of breath: Principal | ICD-10-CM | POA: Insufficient documentation

## 2011-02-18 DIAGNOSIS — F341 Dysthymic disorder: Secondary | ICD-10-CM | POA: Insufficient documentation

## 2011-02-18 DIAGNOSIS — I10 Essential (primary) hypertension: Secondary | ICD-10-CM | POA: Insufficient documentation

## 2011-02-18 DIAGNOSIS — G809 Cerebral palsy, unspecified: Secondary | ICD-10-CM | POA: Insufficient documentation

## 2011-02-18 DIAGNOSIS — R471 Dysarthria and anarthria: Secondary | ICD-10-CM | POA: Insufficient documentation

## 2011-02-18 DIAGNOSIS — I2782 Chronic pulmonary embolism: Secondary | ICD-10-CM | POA: Insufficient documentation

## 2011-02-18 DIAGNOSIS — Z7901 Long term (current) use of anticoagulants: Secondary | ICD-10-CM | POA: Insufficient documentation

## 2011-02-18 DIAGNOSIS — F429 Obsessive-compulsive disorder, unspecified: Secondary | ICD-10-CM | POA: Insufficient documentation

## 2011-02-18 DIAGNOSIS — R079 Chest pain, unspecified: Secondary | ICD-10-CM | POA: Insufficient documentation

## 2011-02-18 LAB — SURGICAL PCR SCREEN: MRSA, PCR: NEGATIVE

## 2011-02-18 LAB — BASIC METABOLIC PANEL
BUN: 6 mg/dL (ref 6–23)
CO2: 29 mEq/L (ref 19–32)
Calcium: 8.7 mg/dL (ref 8.4–10.5)
Chloride: 107 mEq/L (ref 96–112)
Creatinine, Ser: 0.59 mg/dL (ref 0.4–1.2)
GFR calc non Af Amer: >60 mL/min (ref >60)
Glucose, Bld: 103 mg/dL — ABNORMAL HIGH (ref 70–99)
Glucose, Bld: 151 mg/dL — ABNORMAL HIGH (ref 70–99)
Potassium: 4.5 mEq/L (ref 3.5–5.1)
Sodium: 138 mEq/L (ref 135–145)

## 2011-02-18 LAB — DIFFERENTIAL
Basophils Absolute: 0 10*3/uL (ref 0.0–0.1)
Basophils Relative: 0 % (ref 0–1)
Eosinophils Absolute: 0.2 10*3/uL (ref 0.0–0.7)
Monocytes Relative: 9 % (ref 3–12)
Neutrophils Relative %: 59 % (ref 43–77)

## 2011-02-18 LAB — POCT I-STAT 3, ART BLOOD GAS (G3+)
Acid-Base Excess: 5 mmol/L — ABNORMAL HIGH (ref 0.0–2.0)
Bicarbonate: 29.7 mEq/L — ABNORMAL HIGH (ref 20.0–24.0)
O2 Saturation: 92 %
Patient temperature: 98.7
TCO2: 31 mmol/L (ref 0–100)
pCO2 arterial: 44 mmHg (ref 35.0–45.0)
pH, Arterial: 7.438 — ABNORMAL HIGH (ref 7.350–7.400)
pO2, Arterial: 62 mmHg — ABNORMAL LOW (ref 80.0–100.0)

## 2011-02-18 LAB — CBC
HCT: 37.2 % (ref 36.0–46.0)
Hemoglobin: 12.6 g/dL (ref 12.0–15.0)
MCHC: 34.3 g/dL (ref 30.0–36.0)
MCV: 84.4 fL (ref 78.0–100.0)
Platelets: 258 10*3/uL (ref 150–400)
RDW: 15.5 % (ref 11.5–15.5)
WBC: 6.3 10*3/uL (ref 4.0–10.5)

## 2011-02-18 LAB — POCT I-STAT, CHEM 8
BUN: 7 mg/dL (ref 6–23)
Calcium, Ion: 1.09 mmol/L — ABNORMAL LOW (ref 1.12–1.32)
Chloride: 105 mEq/L (ref 96–112)
Chloride: 102 mEq/L (ref 96–112)
Creatinine, Ser: 0.6 mg/dL (ref 0.4–1.2)
Glucose, Bld: 109 mg/dL — ABNORMAL HIGH (ref 70–99)
HCT: 39 % (ref 36.0–46.0)
HCT: 37 % (ref 36.0–46.0)
Hemoglobin: 13.3 g/dL (ref 12.0–15.0)
Hemoglobin: 12.6 g/dL (ref 12.0–15.0)
Potassium: 4 mEq/L (ref 3.5–5.1)
Potassium: 3.5 mEq/L (ref 3.5–5.1)
Sodium: 141 mEq/L (ref 135–145)
Sodium: 140 meq/L (ref 135–145)
TCO2: 29 mmol/L (ref 0–100)

## 2011-02-18 LAB — PROTIME-INR
INR: 1.59 — ABNORMAL HIGH (ref 0.00–1.49)
INR: 1.03 (ref 0.00–1.49)
Prothrombin Time: 13.4 seconds (ref 11.6–15.2)

## 2011-02-18 LAB — WOUND CULTURE: Culture: NO GROWTH

## 2011-02-18 LAB — ANAEROBIC CULTURE

## 2011-02-18 LAB — POCT CARDIAC MARKERS: CKMB, poc: <1 ng/mL — ABNORMAL LOW (ref 1.0–8.0)

## 2011-02-18 LAB — CK TOTAL AND CKMB (NOT AT ARMC)
Relative Index: INVALID (ref 0.0–2.5)
Total CK: 47 U/L (ref 7–177)

## 2011-02-18 NOTE — H&P (Signed)
Family Medicine Teaching [facility] Admission History and Physical  Patient name: [name] [name] Medical record number: [id number] Date of birth: [birth year] Age: 35 y.o. Gender: female  Primary Care Provider: Angeline Slim, MD  Chief Complaint: chest pain, hx of PE History of Present Illness: [name] [name] is a 35 y.o. year old female presenting with chest pain.  Pt was recently admitted for PE found to have increase clot burden on CT at that time while being non compliant on her coumadin.  Pt was seen by her PCP at urgent care where she had an INR of 1.49 but with shortness of breath pt was told to come to the ED for evaluation INR stated 1.59.  Pt states chest pain is with breathing the same as her last clot feeling and was worried because it had been getting better.  Pt states she is very concern about her care at home at this moment but is not open to placement.  Pt states the chest pain is 6/10 at present moment and is at its worse, pt though states the shortness of breath has improved somewhat.  Denies fever, chills, nausea vomiting abdominal pain, dysuria,  Patient Active Problem List  Diagnoses  . DEPRESSION, MAJOR, RECURRENT  . OBSESSIVE COMPUL. DISORDER  . CEREBRAL PALSY  . HYPERTENSION, BENIGN  . DYSFUNCTIONAL UTERINE BLEEDING  . CONTRACTURE OF JOINT OF MULTIPLE SITES  . WRIST PAIN, LEFT  . Pulmonary embolism  . Encounter for long-term (current) use of anticoagulants  . Seborrheic keratosis  . Dyspnea   Past Medical History: Past Medical History  Diagnosis Date  . Cerebral palsy   . Pulmonary embolism     Lifetime Coumadin  . Cerebral palsy     Spastic Cerebral palsy, mentally intact  . Contracture, joint, multiple sites     Electric wheelchair, uses left hand to operate chair.   [name] Kitchen Hypertension   . OCD (obsessive compulsive disorder)   . Depression   . Migraine   . [name]   . Dysarthria   . Endometriosis   . History of recurrent UTIs   .  GERD (gastroesophageal reflux disease)   . Pulmonary embolism 2011, 01/2011    Will be on lifetime coumadin    Past Surgical History: Past Surgical History  Procedure Date  . Colposcopy 06/2000  . Cesarean section   . Tubal ligation 2003  . Carpal tunnel release 08/2008    Dr Teressa Senter  . Wrist surgery 06/2010    Dr Dierdre Searles, hand surgeon, [name]    Social History: .  Family History: No family history on file.  Allergies: No Known Allergies  No current outpatient prescriptions on file.   Review Of Systems: Per HPI with the following additions:  Otherwise 12 point review of systems was performed and was unremarkable.  Physical Exam: Pulse: 80  Blood Pressure: 118/62 RR: 26   O2: 100 on 3L Temp: AF  General: alert, cooperative and mild distress HEENT: extra ocular movement intact and sclera clear, anicteric Heart: S1, S2 normal, no murmur, rub or gallop, regular rate and rhythm Lungs: unlabored breathing and rhonchi Abdomen: abdomen is soft without significant tenderness, masses, organomegaly or guarding Extremities: some contractures from CP no new swelling no new redness, right leg minorly bigger than left which was same as last admission.  Skin:no rashes Neurology: Pt has cerebral palsy some   Labs and Imaging: Lab Results  Component Value Date/Time   NA 141 02/18/2011  3:04 PM   K 4.0  02/18/2011  3:04 PM   CL 105 02/18/2011  3:04 PM   CO2 29 02/05/2011  6:00 AM   BUN 12 02/18/2011  3:04 PM   CREATININE 0.8 02/18/2011  3:04 PM   GLUCOSE 99 02/18/2011  3:04 PM   Lab Results  Component Value Date   WBC 6.5 02/05/2011   HGB 13.3 02/18/2011   HCT 39.0 02/18/2011   MCV 84.8 02/05/2011   PLT 198 02/05/2011   Lab Results  Component Value Date   INR 1.59* 02/18/2011   INR 1.3 02/15/2011   INR 1.8 02/07/2011       Assessment and Plan: [name] [name] is a 35 y.o. year old female presenting with chest pain and anxiety 1. Chest pain-  Still feel likely related to pulmonary embolism no  change on EKG CXR looks normal, pt INR is near goal and still on o2 at same level.  Will keep pt to cycle enzymes but likely able to follow up out pt.  Pt INR is sub-therapuetic but improving form 1.3 last week.  Pt appears to have proper teaching knowing that she had too much leafy vegetables even though she knows she should not.  Pt told that this will be a process and the chest pain as long as it doesn't worsen then needs to cope with it and take her medication. Will cycle enzymes but anticipate short admission.  Did give lovenox and increased one dose of coumadin to 12.5 mg and then likely will go to 10mg  daily and follow up in 2 days with [facility] checking INR. Will consider one more day of lovenox on way out the door if still not therapuetic. No new imaging due to no change in management.  2.  Anxiety-  Continue her current regimen.  Pt has trouble taking care of self and has a lot of care takers but still seems to be coming to hospital frequentlyu, wondering if pt may need higher acuity of care in near future.  3.  Htn-  Continue current regimen.  4. Cerebral palsy-  Continue current regimen such as baclofen.  5. FEN/GI: HHD and SLIV 3. Prophylaxis: lovenox, coumadin and ppi 4. Disposition: anticipate short stay with hopefully home with continued [facility] tomorrow.

## 2011-02-19 ENCOUNTER — Observation Stay (HOSPITAL_COMMUNITY): Payer: Medicaid Other

## 2011-02-19 DIAGNOSIS — R0789 Other chest pain: Secondary | ICD-10-CM

## 2011-02-19 DIAGNOSIS — I2699 Other pulmonary embolism without acute cor pulmonale: Secondary | ICD-10-CM

## 2011-02-19 DIAGNOSIS — G808 Other cerebral palsy: Secondary | ICD-10-CM

## 2011-02-19 LAB — CARDIAC PANEL(CRET KIN+CKTOT+MB+TROPI)
CK, MB: 1.4 ng/mL (ref 0.3–4.0)
CK, MB: 1.3 ng/mL (ref 0.3–4.0)
Total CK: 47 U/L (ref 7–177)

## 2011-02-19 LAB — CBC
HCT: 35.4 % — ABNORMAL LOW (ref 36.0–46.0)
MCV: 83.3 fL (ref 78.0–100.0)
RBC: 4.25 MIL/uL (ref 3.87–5.11)
WBC: 6.5 10*3/uL (ref 4.0–10.5)

## 2011-02-19 LAB — COMPREHENSIVE METABOLIC PANEL
ALT: 13 U/L (ref 0–35)
AST: 13 U/L (ref 0–37)
CO2: 26 mEq/L (ref 19–32)
Chloride: 107 mEq/L (ref 96–112)
GFR calc Af Amer: >60 mL/min (ref >60)
GFR calc non Af Amer: >60 mL/min (ref >60)
Sodium: 136 mEq/L (ref 135–145)
Total Bilirubin: 0.3 mg/dL (ref 0.3–1.2)

## 2011-02-19 LAB — DIFFERENTIAL
Lymphocytes Relative: 47 % — ABNORMAL HIGH (ref 12–46)
Lymphs Abs: 3 10*3/uL (ref 0.7–4.0)
Neutrophils Relative %: 41 % — ABNORMAL LOW (ref 43–77)

## 2011-02-20 LAB — BASIC METABOLIC PANEL
CO2: 28 mEq/L (ref 19–32)
Calcium: 8.9 mg/dL (ref 8.4–10.5)
GFR calc Af Amer: >60 mL/min (ref >60)
GFR calc non Af Amer: >60 mL/min (ref >60)
Sodium: 139 mEq/L (ref 135–145)

## 2011-02-20 LAB — CBC
Hemoglobin: 12 g/dL (ref 12.0–15.0)
MCHC: 32.2 g/dL (ref 30.0–36.0)
Platelets: 261 10*3/uL (ref 150–400)
RBC: 4.44 MIL/uL (ref 3.87–5.11)

## 2011-02-20 LAB — PROTIME-INR
INR: 1.87 — ABNORMAL HIGH (ref 0.00–1.49)
Prothrombin Time: 21.7 seconds — ABNORMAL HIGH (ref 11.6–15.2)

## 2011-02-21 ENCOUNTER — Telehealth: Payer: Self-pay | Admitting: Family Medicine

## 2011-02-21 LAB — BASIC METABOLIC PANEL
BUN: 12 mg/dL (ref 6–23)
CO2: 30 mEq/L (ref 19–32)
Chloride: 106 mEq/L (ref 96–112)
Creatinine, Ser: 0.69 mg/dL (ref 0.4–1.2)
Glucose, Bld: 106 mg/dL — ABNORMAL HIGH (ref 70–99)

## 2011-02-21 LAB — CBC
HCT: 35.6 % — ABNORMAL LOW (ref 36.0–46.0)
Hemoglobin: 11.5 g/dL — ABNORMAL LOW (ref 12.0–15.0)
MCH: 27.6 pg (ref 26.0–34.0)
MCV: 85.4 fL (ref 78.0–100.0)
RBC: 4.17 MIL/uL (ref 3.87–5.11)

## 2011-02-21 NOTE — Telephone Encounter (Signed)
Pt asking to speak with RN about possibly having uti, pt suppose to be going to rehab facility & cant come in for an appt.

## 2011-02-21 NOTE — Telephone Encounter (Signed)
Spoke with patient and she states she is having urinary symptoms. She is having discomfort with urination.. Scheduled appointment for tomorrow AM then patient tells RN that she is a patient in the hospital currently. Advised her to talk to her nurse in the hospital and have them contact her doctor to see what they need to do for her current symptoms.

## 2011-02-22 ENCOUNTER — Ambulatory Visit: Payer: Medicaid Other | Admitting: Sports Medicine

## 2011-02-22 NOTE — H&P (Signed)
[name] [name]              ACCOUNT NO.:  [id number]  MEDICAL RECORD NO.:  [id number]           PATIENT TYPE:  E  LOCATION:  [facility]                         FACILITY:  [facility]  PHYSICIAN:  Santiago Bumpers. Hensel, M.D.DATE OF BIRTH:  Jul 06, 1976  DATE OF ADMISSION:  02/18/2011 DATE OF DISCHARGE:                             HISTORY & PHYSICAL   PRIMARY CARE PROVIDER:  Angeline Slim, MD, at [facility].  CHIEF COMPLAINT:  Chest pain and history of PE.  HISTORY OF PRESENT ILLNESS:  [name] [name] is a 35 year old female with past medical history of significant cerebral palsy and recent admission for the patient having pulmonary embolism and found to have increased clot burden on repeat CT scan while the patient was being noncompliant on her Coumadin.  The patient [facility], found to have an INR at 1.49 with shortness of breath and chest pain, came to the ED to be evaluated, INR was 1.59, was called down to the ED, so the patient would be seen and evaluated.  The patient states her chest pain is worse with breathing, pleuritic, feels just like when she had her initial pulmonary embolism, worried because it was getting better at first and now seems that the pain is getting worse.  The patient feels the pain is 6/10, still kind of all over, cannot really pinpoint where the pain is.  The patient states that she might have been increasing somewhat short of breath, but still using the same amount of oxygen of 3 liters.  The patient was sent home at that time of discharge.  On last admission which was on February 06, 2011.  The patient denies any type of fevers, chills, nausea, vomiting, abdominal pain, or dysuria.  PERTINENT PAST MEDICAL HISTORY: 1. Cerebral palsy. 2. Pulmonary embolism, now is going to be a lifetime Coumadin     candidate. 3. 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  She is in     an electric wheelchair, uses  left hand to operate chair. 4. Hypertension. 5. Obsessive-compulsive disorder. 6. Depression. 7. Migraine 8. Degenerative joint disease. 9. Dysarthria 10.Endometriosis. 11.History of recurrent UTIs. 12.GERD.  PAST SURGICAL HISTORY: 1. Cesarean section. 2. Tubal ligation. 3. Carpal tunnel release by Dr. Teressa Senter back in 2009. 4. Foot surgery by Dr. Nedra Hai in July 2011.  SOCIAL HISTORY:  She is divorced, never smoked, lives at home with a caregiver, using an electrical wheelchair to get around, requires assistance for ADLs and IADLs.  She does not use tobacco, alcohol, or drugs.  FAMILY HISTORY:  Noncontributory.  ALLERGIES:  No known drug allergies.  MEDICATIONS: 1. Hydrocodone/acetaminophen 5/325 mg 1 tablet by mouth every 4 hours     as needed for pain. 2. Phenergan 25 mg p.o. q.6 h. p.r.n. 3. Topamax 25 mg 2 tablets p.o. daily. 4. Effexor 300 mg p.o. daily. 5. Coumadin 10 mg p.o. daily. 6. Albuterol nebs 1-2 nebs inhaled every 4 hours p.r.n. for shortness     of breath. 7. Norvasc 5 mg tablet daily. 8. Baclofen 10 mg 1 tablet p.o. q.i.d. 9. Motrin 600  mg 1 tablet p.o. t.i.d. p.r.n. for pain. 10.Imitrex 1 tablet p.o. p.r.n. for migraines. 11.Klonopin 1 mg p.o. b.i.d. 12.Lamictal 150 mg 1 p.o. daily. 13.MiraLax 1 tablespoon p.o. daily as needed for constipation. 14.Oxybutynin 5 mg tablet p.o. t.i.d. 15.Seroquel 200 mg 1 tablet p.o. nightly. 16.Tramadol 50 mg 1 tablet p.o. t.i.d. p.r.n. for pain. 17.Zanaflex 2 mg 1 tablet p.o. daily.  PHYSICAL EXAMINATION:  VITAL SIGNS:  Pulse 80, blood pressure 118/62, respirations 26, and O2 is 100% on 3 liters.  The patient is afebrile. GENERAL APPEARANCE:  Alert, cooperative, in mild distress. EYES:  Extraocular movements are intact.  Sclerae are clear, anicteric. HEART:  S1 and S2 normal.  No murmur, rub, or gallop.  Regular rate and rhythm. LUNGS:  Unlabored breathing and rhonchi. ABDOMEN:  Soft without significant tenderness,  masses, organomegaly, or guarding. EXTREMITIES:  Some contractures from cerebral palsy.  No swelling.  No new redness.  Right leg moderately bigger the left leg, which is the same as last admission. SKIN:  No rashes. NEUROLOGICAL:  The patient does have some cerebral palsy, it makes hard for neurologic exam to be full.  PERTINENT LABORATORY DATA:  The patient did have BMET done that shows a sodium of 141, potassium of 4.0, chloride of 105, bicarb of 29, BUN of 12, creatinine of 0.8, and glucose of 99.  The patient's CBC showed a white blood cell count of 6.5, a hemoglobin of 13.3, a hematocrit of 39.0, MCV of 84.8, and platelets of 198.  The patient's INR was 1.59 while here, last during clinic back on February 15, 2011, was 1.3 and back on February 07, 2011, was 1.8.  At the time of discharge, the patient's INR was on February 06, 2011, of 1.77.  ASSESSMENT/PLAN: 1. [name] [name] is a 35 year old female presenting with chest pain,     anxiety, and chest pain, feels mostly related to the pulmonary     embolism.  No changes in EKG.  Chest x-ray looks normal.  No signs     of pneumonia or anything that have caused it.  The patient has had     no trauma.  The patient's INR is near goal and still is on the same     O2 settings at this level.  No other imaging I feel is necessary at     this time.  We will cycle her enzymes, but likely the patient will     be fine and can do most other followup outpatient.  The patient INR     is subtherapeutic, but is improving from 1.3 last week.  Has had     proper teaching because the patient did state that she has had too     many leafy vegetables and notes that they are good for her.  The     patient to me at this point seems to be neglecting herself and not     taking good care of herself pretty much on purpose at this time and     seems to want to be here.  The patient was given the option even     actually possibly going home as she was feeling better and  stated     that she would rather stay in the hospital.  I decided that this     was okay just to cycle enzymes, but the patient will likely be here     relatively short.  We did give the patient Lovenox again due to  her     not being subtherapeutic.  We increased her Coumadin to 12.5 x1,     but likely can go back to the 10 mg daily and because the patient's     INR is improving slowly.  We will follow up I think 2 days     thereafter with a home health RN checking an INR.  We will consider     one more day of Lovenox if the patient is here after lunch tomorrow     and we will see how that goes. 2. Anxiety/depression.  We are going to continue her current regimen.     This seems to be a root of a lot of her problems at this point and     seems that this is getting her some attention and it is why she is     frequently in the hospital more often.  Wondering if the patient     may need higher acute care in the near future. 3. Hypertension.  Continue current regimen. 4. Cerebral palsy.  Continue current regimen to help with     contractures. 5. Fluids, electrolytes, nutrition and gastrointestinal.  We will do a     heart-healthy diet and saline lock IV. 6. Prophylaxis.  She is getting Lovenox and Coumadin as well.  We will     start a PPI. 7. Disposition.  Anticipate short stay with continued home health RN     tomorrow.     Antoine Primas, DO   ______________________________ Santiago Bumpers Leveda Anna, M.D.    ZS/MEDQ  D:  02/18/2011  T:  02/19/2011  Job:  [id number]  Electronically Signed by Antoine Primas  on 02/20/2011 09:43:18 AM Electronically Signed by Doralee Albino M.D. on 02/22/2011 02:32:22 PM

## 2011-02-26 ENCOUNTER — Telehealth: Payer: Self-pay | Admitting: Family Medicine

## 2011-02-26 NOTE — Telephone Encounter (Signed)
She is [facility] in [place].  The doctor there wants to give her Ibuprofen for her cramps and she says that Dr Janalyn Harder didn't want to give it to her.  Couldn't understand all of her comments, but she is needing to talk to nurse.

## 2011-02-26 NOTE — Telephone Encounter (Signed)
Called pt. It was very hard to understand pt. Pt c/o pain and does not want Ibuprofen. I told the pt that Dr.Ta is not in the office and that I would like to speak with someone in the facility. The pt said, that it's ok. I told the pt, that she should please have the facility fax a request or message to [place] and one of our doctors would address it. Pt agreed. Arlyss Repress

## 2011-02-26 NOTE — Discharge Summary (Signed)
[name] [name], HAFNER NO.:  [id number]  MEDICAL RECORD NO.:  [id number]           PATIENT TYPE:  I  LOCATION:  5526                         FACILITY:  [facility]  PHYSICIAN:  Nestor Ramp, MD        DATE OF BIRTH:  [birth year]  DATE OF ADMISSION:  02/04/2011 DATE OF DISCHARGE:  02/06/2011                              DISCHARGE SUMMARY   PRIMARY CARE PROVIDER:  Dr. Angeline Slim, MD, at the [facility].  DISCHARGE DIAGNOSES: 1. Pulmonary embolus. 2. Cerebral palsy. 3. Hypertension. 4. Obsessive-compulsive disorder. 5. Depression/anxiety. 6. Migraines. 7. Degenerative joint disease. 8. Dysarthria. 9. History of recurrent UTIs.  DISCHARGE MEDICATIONS: 1. Lovenox 100 mg subcu daily to complete a 5-day course. 2. Hydrocodone/acetaminophen 5/325 mg tab 1 tablet p.o. q.4 h. p.r.n.     for pain. 3. Phenergan 25 mg p.o. q.6 h. P.r.n. 4. Topamax 25 mg 2 tablets p.o. daily. 5. Effexor 300 mg p.o. daily.6. Coumadin 10 mg p.o. daily. 7. Albuterol neb 1-2 nebs inhale q.4 h. p.r.n. for shortness of     breath. 8. Norvasc 5 mg 1 tablet p.o. daily. 9. Baclofen 10 mg 1 tablet p.o. q.i.d. 10.Ibuprofen 600 mg 1 tablet p.o. t.i.d. p.r.n. for pain. 11.Imitrex 1 tablet p.o. p.r.n. for migraines. 12.Clonazepam 1 mg 1 tablet p.o. b.i.d. 13.Lamictal 150s mg 1 tablet p.o. daily. 14.MiraLax 1 tablespoon p.o. daily p.r.n. for constipation. 15.Oxybutynin 5 mg 1 tablet p.o. t.i.d. 16.Seroquel 200 mg 1 tablet p.o. nightly. 17.Tramadol 50 mg 1 tablet p.o. t.i.d. p.r.n. for pain. 18.Zanaflex 2 mg 1 tablet p.o. daily.  STUDIES AND IMAGING: 1. She had a chest x-ray on February 04, 2011, with impression low lung     volumes.  No acute findings. 2. She had a CT angiogram of the chest on February 04, 2011, with     impression     a.     Stable appearance of bilateral pulmonary emboli since      January 10, 2011.     b.     No new abnormalities noted.  LABORATORY FINDINGS:  At  the time of admission:  CBC; WBC 6.3, hemoglobin 12.6, hematocrit of 38.1 and platelets of 209.  Chemistry with sodium 136, potassium 4.0, chloride 105, bicarb 28, BUN of 11, creatinine 0.74 and glucose of 111.  INR was 1.46.  At the time of discharge CBC showed white blood cells 6.5, hemoglobin 12.0, hematocrit 37.3 and platelets of 198.  Chemistry with sodium 140, potassium 4.2, chloride 108, bicarb 29, BUN of 11, creatinine 0.75 and glucose of 82, INR was 1.77.  BRIEF HOSPITAL COURSE:  This is a 35 year old female with recent history of pulmonary embolus found to have increased chest pain in the setting of nontherapeutic INR. 1. Pulmonary embolism.  At the time of admission the patient had     increased chest pain and increased oxygen requirement.  Her INR was     not therapeutic at time of admission.  CT angiogram showed no     increase in clot burden since January 10, 2011.  She was admitted  and restarted on her bridge of Lovenox with continued Coumadin.  On     second day of her admission, her INR was trending up and she had no     increased O2 requirement as she was back to her baseline O2 of 2 L.     Social work was consulted to help set up home health for her so     that she can get her Lovenox shots.  She was dosed Lovenox 1.5     mg/kg per day and an appointment was made for her to follow up at     the [facility] on March 8 at 1:30 p.m. to have her INR     rechecked. 2. Cerebral palsy.  The patient was continued on her home medication     regimen for her spasticity.  No other medications or therapies were     added during this hospitalization. 3. Obsessive compulsive disorder.  The patient was continued on her     home medicines throughout the hospitalization.  No exacerbation of     symptoms were seen during the hospitalization. 4. Chronic pain.  The patient was continued on her home regimen of     pain medication.  Initially thought to be oxycodone 5 mg  q.6     p.r.n., however, the patient informed us that this is not her     current pain medication regimen and the oxycodone made her too     sleepy.  She was then switched to hydrocodone which she tolerated     much better.  She was discharged with prescription for hydrocodone     as she was out of this medication at home. 5. Migraine.  The patient did have 1 migraine during the     hospitalization.  She was given Imitrex which helped improve her     symptoms at the time.  After the initial migraine, the patient had     no further migraines.  DISCHARGE INSTRUCTIONS:  The patient was discharged with no restrictions and activity.  No restrictions in diet.  She is reminded that if she was once again to have chest pain or shortness of breath that she should return to the emergency department or call the [facility].  FOLLOWUP:  The patient was given followup appointments with Dr. Janalyn Harder on March 16 at 1:45 p.m. as well as to have her INR checked at [facility] on March 8 at 1:30 p.m.  She is given the phone number in case she needed to change or cancel this appointment.  FOLLOWUP ISSUES/RECOMMENDATIONS:  Continue to follow INR to ensure the patient is therapeutic  DISCHARGE CONDITION:  The patient was discharged home in stable medical condition.    ______________________________ Everrett Coombe, MD   ______________________________ Nestor Ramp, MD    CM/MEDQ  D:  02/10/2011  T:  02/11/2011  Job:  [id number]  Electronically Signed by Everrett Coombe MD on 02/19/2011 02:32:50 PM Electronically Signed by Denny Levy MD on 02/26/2011 04:37:15 PM

## 2011-02-28 NOTE — Telephone Encounter (Signed)
Reviewed. To PCP

## 2011-03-08 LAB — BASIC METABOLIC PANEL
BUN: 10 mg/dL (ref 6–23)
BUN: 10 mg/dL (ref 6–23)
BUN: 9 mg/dL (ref 6–23)
CO2: 26 mEq/L (ref 19–32)
Calcium: 8.6 mg/dL (ref 8.4–10.5)
Calcium: 9.1 mg/dL (ref 8.4–10.5)
Calcium: 9.8 mg/dL (ref 8.4–10.5)
Chloride: 108 mEq/L (ref 96–112)
Creatinine, Ser: 0.69 mg/dL (ref 0.4–1.2)
GFR calc Af Amer: >60 mL/min (ref >60)
GFR calc non Af Amer: >60 mL/min (ref >60)
GFR calc non Af Amer: >60 mL/min (ref >60)
Glucose, Bld: 102 mg/dL — ABNORMAL HIGH (ref 70–99)
Glucose, Bld: 94 mg/dL (ref 70–99)
Potassium: 3.7 mEq/L (ref 3.5–5.1)
Sodium: 138 mEq/L (ref 135–145)

## 2011-03-08 LAB — CBC
HCT: 36.7 % (ref 36.0–46.0)
Hemoglobin: 11.8 g/dL — ABNORMAL LOW (ref 12.0–15.0)
MCHC: 33.5 g/dL (ref 30.0–36.0)
MCHC: 33.9 g/dL (ref 30.0–36.0)
MCHC: 33.7 g/dL (ref 30.0–36.0)
MCV: 86.9 fL (ref 78.0–100.0)
MCV: 88.2 fL (ref 78.0–100.0)
Platelets: 259 10*3/uL (ref 150–400)
Platelets: 294 10*3/uL (ref 150–400)
Platelets: 337 10*3/uL (ref 150–400)
RDW: 14.1 % (ref 11.5–15.5)
RDW: 14.1 % (ref 11.5–15.5)
RDW: 14.3 % (ref 11.5–15.5)
WBC: 8.7 10*3/uL (ref 4.0–10.5)

## 2011-03-08 LAB — DIFFERENTIAL
Basophils Absolute: 0 10*3/uL (ref 0.0–0.1)
Basophils Absolute: 0 10*3/uL (ref 0.0–0.1)
Basophils Relative: 1 % (ref 0–1)
Eosinophils Absolute: 0.3 10*3/uL (ref 0.0–0.7)
Eosinophils Relative: 3 % (ref 0–5)
Lymphocytes Relative: 34 % (ref 12–46)
Monocytes Absolute: 0.7 10*3/uL (ref 0.1–1.0)
Monocytes Relative: 8 % (ref 3–12)
Neutro Abs: 3.5 10*3/uL (ref 1.7–7.7)

## 2011-03-08 LAB — PROTIME-INR
INR: 2 — ABNORMAL HIGH (ref 0.00–1.49)
INR: 1.1 (ref 0.00–1.49)
INR: 1.1 (ref 0.00–1.49)
Prothrombin Time: 13 seconds (ref 11.6–15.2)
Prothrombin Time: 14 seconds (ref 11.6–15.2)
Prothrombin Time: 14 seconds (ref 11.6–15.2)
Prothrombin Time: 21.2 seconds — ABNORMAL HIGH (ref 11.6–15.2)

## 2011-03-08 LAB — CARDIAC PANEL(CRET KIN+CKTOT+MB+TROPI)
CK, MB: 1.5 ng/mL (ref 0.3–4.0)
CK, MB: 3.3 ng/mL (ref 0.3–4.0)
Relative Index: INVALID (ref 0.0–2.5)
Relative Index: INVALID (ref 0.0–2.5)
Relative Index: INVALID (ref 0.0–2.5)
Relative Index: INVALID (ref 0.0–2.5)
Total CK: 69 U/L (ref 7–177)
Troponin I: 0.01 ng/mL (ref 0.00–0.06)
Troponin I: 0.01 ng/mL (ref 0.00–0.06)
Troponin I: 0.03 ng/mL (ref 0.00–0.06)

## 2011-03-08 LAB — URINALYSIS, ROUTINE W REFLEX MICROSCOPIC
Glucose, UA: NEGATIVE mg/dL
Hgb urine dipstick: NEGATIVE
Ketones, ur: NEGATIVE mg/dL
Protein, ur: NEGATIVE mg/dL

## 2011-03-08 LAB — TROPONIN I: Troponin I: 0.01 ng/mL (ref 0.00–0.06)

## 2011-03-08 LAB — CK TOTAL AND CKMB (NOT AT ARMC)
CK, MB: 1 ng/mL (ref 0.3–4.0)
Relative Index: INVALID (ref 0.0–2.5)
Total CK: 52 U/L (ref 7–177)

## 2011-03-08 LAB — URINE MICROSCOPIC-ADD ON

## 2011-03-08 LAB — BRAIN NATRIURETIC PEPTIDE: Pro B Natriuretic peptide (BNP): 89 pg/mL (ref 0.0–100.0)

## 2011-03-08 LAB — POCT CARDIAC MARKERS
Myoglobin, poc: 54.4 ng/mL (ref 12–200)
Troponin i, poc: <0.05 ng/mL (ref 0.00–0.09)

## 2011-03-09 LAB — COMPREHENSIVE METABOLIC PANEL
BUN: 11 mg/dL (ref 6–23)
CO2: 20 mEq/L (ref 19–32)
Calcium: 9.2 mg/dL (ref 8.4–10.5)
Creatinine, Ser: 0.75 mg/dL (ref 0.4–1.2)
GFR calc Af Amer: >60 mL/min (ref >60)
GFR calc non Af Amer: >60 mL/min (ref >60)
Glucose, Bld: 89 mg/dL (ref 70–99)

## 2011-03-09 LAB — CBC
HCT: 39.6 % (ref 36.0–46.0)
Hemoglobin: 13.2 g/dL (ref 12.0–15.0)
MCHC: 33.3 g/dL (ref 30.0–36.0)
MCV: 88.3 fL (ref 78.0–100.0)
RBC: 4.48 MIL/uL (ref 3.87–5.11)

## 2011-03-09 LAB — DIFFERENTIAL
Basophils Absolute: 0 10*3/uL (ref 0.0–0.1)
Lymphocytes Relative: 34 % (ref 12–46)
Lymphs Abs: 3.2 10*3/uL (ref 0.7–4.0)
Neutro Abs: 5.3 10*3/uL (ref 1.7–7.7)
Neutrophils Relative %: 57 % (ref 43–77)

## 2011-03-09 LAB — POCT CARDIAC MARKERS
Myoglobin, poc: 39.6 ng/mL (ref 12–200)
Troponin i, poc: <0.05 ng/mL (ref 0.00–0.09)

## 2011-03-10 LAB — BASIC METABOLIC PANEL
BUN: 7 mg/dL (ref 6–23)
Calcium: 9.3 mg/dL (ref 8.4–10.5)
Creatinine, Ser: 0.64 mg/dL (ref 0.4–1.2)
GFR calc Af Amer: >60 mL/min (ref >60)

## 2011-03-10 LAB — CBC
MCHC: 33.8 g/dL (ref 30.0–36.0)
Platelets: 291 10*3/uL (ref 150–400)
RBC: 4.17 MIL/uL (ref 3.87–5.11)
WBC: 7.3 10*3/uL (ref 4.0–10.5)

## 2011-03-15 ENCOUNTER — Ambulatory Visit (INDEPENDENT_AMBULATORY_CARE_PROVIDER_SITE_OTHER): Payer: Medicaid Other | Admitting: Family Medicine

## 2011-03-15 ENCOUNTER — Encounter: Payer: Self-pay | Admitting: Family Medicine

## 2011-03-15 VITALS — BP 124/81 | HR 104 | Temp 97.9°F

## 2011-03-15 DIAGNOSIS — R339 Retention of urine, unspecified: Secondary | ICD-10-CM

## 2011-03-15 DIAGNOSIS — Z936 Other artificial openings of urinary tract status: Secondary | ICD-10-CM | POA: Insufficient documentation

## 2011-03-15 DIAGNOSIS — N949 Unspecified condition associated with female genital organs and menstrual cycle: Secondary | ICD-10-CM

## 2011-03-15 DIAGNOSIS — R300 Dysuria: Secondary | ICD-10-CM

## 2011-03-15 DIAGNOSIS — Z9359 Other cystostomy status: Secondary | ICD-10-CM | POA: Insufficient documentation

## 2011-03-15 LAB — POCT URINALYSIS DIPSTICK
Bilirubin, UA: NEGATIVE
Ketones, UA: NEGATIVE
Leukocytes, UA: NEGATIVE
Nitrite, UA: NEGATIVE
Protein, UA: NEGATIVE
pH, UA: 6

## 2011-03-15 NOTE — Progress Notes (Signed)
  Subjective:    Patient ID: [name] [name], female    DOB: 1976/07/10, 35 y.o.   MRN: [id number]  HPI Currently living in skilled nursing facility for rehab. Seen for work-in appt today  chronic pelvic pain due to endometriosis: currently being managed by gynecology at [facility], planning on IUD placement.  States pain is worse.  Not currently on menses.  No fever, chills, emesis.  She is upset that the nursing home has placed her on ibuprofen as she does not feel this is appropriate with her coumadin tx.  States she has been taking vicodin and tramadol, but this is  Not helping.  Dysuria:  Has chronic dysuria, complicated by incontinence.  States she haas been unable to urinate today.      Review of Systemssee hpi     Objective:   Physical Exam  Constitutional: She appears well-developed and well-nourished.  Cardiovascular: Normal rate and regular rhythm.   No murmur heard. Pulmonary/Chest: Effort normal and breath sounds normal. She has no wheezes.  Abdominal: Soft. Bowel sounds are normal. She exhibits no distension. There is no tenderness. There is no rebound and no guarding.          Assessment & Plan:

## 2011-03-15 NOTE — Assessment & Plan Note (Signed)
States specialists at [facility] will not do TAH due to chronic coumadin therapy and she is not a good candidate for combined OCP due to hx of PE.  They are planning on inserting mirena.  Advised patient to continue tramadol and hydrocodone per her PCP's plan and to schedule follow-up to discuss chronic pain management with her PCP.  Patient was given 1 mg of  Morphine in the office today, I suspect worsening of her pain may be due to urinary retention today.  Also asked NH if they would allow her warm compresses for additional pain relief.

## 2011-03-15 NOTE — Patient Instructions (Signed)
Please schedule visit with Dr. Janalyn Harder to discuss if you feel you need to change your pain medications. Try warm compresses for your pain

## 2011-03-19 ENCOUNTER — Telehealth: Payer: Self-pay | Admitting: *Deleted

## 2011-03-19 NOTE — Telephone Encounter (Signed)
Patient calls and made appointment for 03/27/2011. States she cannot come in this week.  She states her pain is rated at a 9 now and the pain medication is not working. Advised patient that she needs to tell her nurses  at the nursing facility about her pain,  for them to help her and decide if she needs to go to ER today . She voices understanding.

## 2011-03-21 ENCOUNTER — Telehealth: Payer: Self-pay | Admitting: Family Medicine

## 2011-03-21 NOTE — Telephone Encounter (Signed)
Forward to Ta 

## 2011-03-21 NOTE — Telephone Encounter (Signed)
Patient called back, cannot come in tomorrow, scheduled for Monday at 1:30.

## 2011-03-21 NOTE — Telephone Encounter (Signed)
Pt can come in to clinic tomorrow for INR check.

## 2011-03-21 NOTE — Telephone Encounter (Signed)
Pt had PT check Mon 4/16 & it was 1.49. Pt scheduled to see MD next Wed, wants to know if she needs to have PT checked before then here at [facility]?

## 2011-03-22 ENCOUNTER — Ambulatory Visit: Payer: Medicaid Other

## 2011-03-22 NOTE — Discharge Summary (Signed)
NAMESUNNIE, ODDEN              ACCOUNT NO.:  [id number]  MEDICAL RECORD NO.:  [id number]           PATIENT TYPE:  E  LOCATION:  [facility]                         FACILITY:  [facility]  PHYSICIAN:  Santiago Bumpers. Hensel, M.D.DATE OF BIRTH:  21-Nov-1976  DATE OF ADMISSION:  02/18/2011 DATE OF DISCHARGE:  02/21/2011                              DISCHARGE SUMMARY   PRIMARY CARE PROVIDER:  Angeline Slim, MD, [facility]  DISCHARGE DIAGNOSES: 1. Recent pulmonary embolus. 2. Chest pain. 3. Cerebral palsy. 4. Hypertension. 5. Obsessive-compulsive disorder. 6. Depression/anxiety. 7. Migraines. 8. Degenerative joint disease. 9. Dysarthria. 10.History of recurrent urinary tract infection.  DISCHARGE MEDICATIONS: 1. Lovenox 100 mg/mL injection 100 mg subcu q.24 h. take for 1     additional day to complete bridge. 2. Coumadin 10 mg 1 tab p.o. daily to be adjusted based on INR. 3. Abilify 2 mg 1 tab p.o. daily. 4. Albuterol neb 2.5 mg for 3 mL 1-2 nebs p.o. inhaled q.4 h. p.r.n.     shortness of breath. 5. Norvasc 5 mg 1 tab p.o. daily. 6. Baclofen 10 mg 1 tablet p.o. q.i.d. 7. Effexor 150 mg 2 tabs p.o. daily. 8. Hydrocodone/acetaminophen 5/325 one tab p.o. q.4 h. for pain. 9. Ibuprofen 600 mg 1 tab p.o. t.i.d. for pain. 10.Imitrex 20 mg 1 tab p.o. daily p.r.n. 11.Klonopin 1 mg p.o. b.i.d. 12.Lamictal 150 mg 1 tab p.o. daily. 13.MiraLax 17 g p.o. daily p.r.n. for constipation. 14.Oxybutynin 5 mg 1 tab p.o. t.i.d. 15.Promethazine 25 mg 1 tab p.o. q.6 h. p.r.n. for nausea. 16.Seroquel 200 mg 1 tab p.o. at bedtime. 17.Topamax 25 mg 2 tabs p.o. daily. 18.Tramadol 50 mg 1 tab p.o. t.i.d. p.r.n. for pain. 19.Zanaflex 2 mg 1 tab p.o. daily.  CONSULTATIONS:  Social work and PT/OT.  IMAGING/PROCEDURES:  She had a chest x-ray on February 18, 2011, impression:  Low lung volumes with possible atelectasis at the right lung base.  She had a chest x-ray on February 19, 2011, impression:   Low lung volumes, no acute findings.  LABORATORY DATA:  At the time of admission, sodium is 141, potassium 4.0, chloride 105, bicarb 22, creatinine is 0.8, BUN of 12, glucose of 99.  INR was 1.59.  Cardiac enzymes were negative x3.  CBC; WBC 6.5, hemoglobin 11.6, hematocrit 35.4, and platelets 258.  At the time of discharge, INR was 2.53.  WBC is 5.5, hemoglobin 11.5, hematocrit 35.6, and platelets 256.  Sodium 140, potassium 3.7, chloride 106, bicarb 30, BUN of 12, creatinine 0.6, and glucose of 106.  BRIEF HOSPITAL COURSE:  This is a 35 year old female with recent history of pulmonary embolus and cerebral palsy found to have increased chest pain in the setting of nontherapeutic INR.  1. Chest pain.  Chest pain is thought to be likely secondary to her     recent pulmonary embolus.  She was ruled out with cardiac enzymes     negative x3.  However, her INR was subtherapeutic in the setting of     recent pulmonary embolus.  She was admitted and bridged with     Lovenox to Coumadin.  She did have one additional day of Lovenox to     complete her bridge.  At the time of discharge, her INR was 2.53.     During hospitalization, she did not have increased O2 requirement     from her baseline of 1-2 L.  PT/OT was consulted for her increased     chest pain and dyspnea on exertion to determine her capacity for     ADLs as well as possible deconditioning component from her PE.     PT/OT recommended short-term SNF rehab for deconditioning.  The     patient did elect to go to short-term SNF rehab for rehabilitative     services to help with deconditioning. 2. Trouble falling.  The patient was continued on her home medication     regimen for spasticity.  No other medication therapies were added     during hospitalization. 3. Obsessive-compulsive disorder.  The patient was continued on her     home meds since her hospitalization.  No lacerations symptoms were     seen during the  hospitalization. 4. Chronic pain.  The patient is to continue with her home regimen of     pain medication with Hydrocodone as well as tramadol. 5. Hypertension.  The patient was continued on her home medications of     Norvasc.  Her blood pressure remained under good control during the     hospitalization.  DISCHARGE INSTRUCTIONS:  The patient will be discharged to short-term skilled nurse facility for rehab.  She was reminded that if she has increased chest pain or increased shortness of breath, she should return to the Emergency Department.  FOLLOW-UP:  The patient will have INRs followed up at short-term SNF. It may be called to Dr. Angeline Slim as well with her primary care provider at [phone].  She is to follow up with Dr. Janalyn Harder at [facility] after discharged from short-term rehab.  FOLLOWUP ISSUES/RECOMMENDATION:  Continue to follow INR to ensure the patient remain therapeutic.  DISCHARGE CONDITION:  The patient was discharged to skilled nurse facility in stable medical condition.    ______________________________ Everrett Coombe, MD   ______________________________ Santiago Bumpers. Leveda Anna, M.D.    CM/MEDQ  D:  02/21/2011  T:  02/21/2011  Job:  [id number]  Electronically Signed by Everrett Coombe MD on 03/05/2011 09:26:04 PM Electronically Signed by Doralee Albino M.D. on 03/22/2011 04:40:37 PM

## 2011-03-25 ENCOUNTER — Ambulatory Visit (INDEPENDENT_AMBULATORY_CARE_PROVIDER_SITE_OTHER): Payer: Medicaid Other | Admitting: *Deleted

## 2011-03-25 DIAGNOSIS — Z86718 Personal history of other venous thrombosis and embolism: Secondary | ICD-10-CM

## 2011-03-25 DIAGNOSIS — Z7901 Long term (current) use of anticoagulants: Secondary | ICD-10-CM

## 2011-03-25 DIAGNOSIS — I2699 Other pulmonary embolism without acute cor pulmonale: Secondary | ICD-10-CM

## 2011-03-25 LAB — POCT INR: INR: 2.9

## 2011-03-27 ENCOUNTER — Ambulatory Visit (INDEPENDENT_AMBULATORY_CARE_PROVIDER_SITE_OTHER): Payer: Medicaid Other | Admitting: Family Medicine

## 2011-03-27 ENCOUNTER — Encounter: Payer: Self-pay | Admitting: Family Medicine

## 2011-03-27 DIAGNOSIS — R0989 Other specified symptoms and signs involving the circulatory and respiratory systems: Secondary | ICD-10-CM

## 2011-03-27 DIAGNOSIS — N949 Unspecified condition associated with female genital organs and menstrual cycle: Secondary | ICD-10-CM

## 2011-03-27 DIAGNOSIS — R0600 Dyspnea, unspecified: Secondary | ICD-10-CM

## 2011-03-27 MED ORDER — HYDROCODONE-ACETAMINOPHEN 5-325 MG PO TABS: 1.0000 | ORAL_TABLET | ORAL | Status: DC | PRN

## 2011-03-27 NOTE — Assessment & Plan Note (Signed)
Pt has endometriosis and dealing with severe abd cramping.  She has been seen by [facility] for hysterectomy.  They have determined that she is too high risk for surgery, given that she has recurrent PE and I am reluctant to hold her coumadin for surgery.  At this time they are considering IUD to help with pain.  Pt has BTL for contraception.   I have had a long discussion with patient regarding pain control.  We have agreed to hydrocodone-apap 5-325 #30 tablets per month. She will not ask for anymore meds besides this.

## 2011-03-27 NOTE — Patient Instructions (Signed)
Please come back in 1 week to check INR. Please make appointment to see Dr Janalyn Harder in 4-6 weeks.   Vegetables to avoid while taking coumadin. Kale  Spinach  Brussels sprouts  Parsley  Collard greens  Mustard greens  Chard  Green tea   Cranberry juice  Alcohol

## 2011-03-27 NOTE — Assessment & Plan Note (Signed)
History of recurrent PE x 2 and is on chronic coumadin.  Sometimes she uses oxygen, esp after she has to exert from bathing/dressing.  She has had worsening dyspnea and has been difficult to wean off of oxygen.  She has been seen in ER multiple times for dyspnea, with negative serial cardiac enzymes.  Today she is not using oxygen, but she is carrying it with her motorized wheel chair, just in case.  This may be due to deconditioning.  I have referred her to PT in hope that it would help, but she still complains of dyspnea.  I will refer her Pulm for further work up.

## 2011-03-27 NOTE — Progress Notes (Signed)
  Subjective:    Patient ID: [name] [name], female    DOB: 09/13/1976, 35 y.o.   MRN: [id number]  HPI  Abd cramping: Pt has endometriosis and dealing with severe abd cramping.  She has been seen by [facility] for hysterectomy.  They have determined that she is too high risk for surgery, given that she has recurrent PE and I am reluctant to hold her coumadin for surgery.  At this time they are considering IUD to help with pain.  Pt has BTL for contraception.    Dypsnea on exertion: History of recurrent PE x 2 and is on chronic coumadin.  Sometimes she uses oxygen, esp after she has to exert from bathing/dressing.  She has had worsening dyspnea and has been difficult to wean off of oxygen.  She has been seen in ER multiple times for dyspnea, with negative serial cardiac enzymes.  Today she is not using oxygen, but she is carrying it with her motorized wheel chair, just in case.   Review of Systems  Constitutional: Positive for fatigue. Negative for fever and chills.  Respiratory: Positive for shortness of breath. Negative for cough, chest tightness and wheezing.   Cardiovascular: Negative for chest pain and palpitations.  Gastrointestinal: Positive for vomiting. Negative for nausea, abdominal pain, diarrhea and constipation.  Musculoskeletal: Negative for joint swelling.       Objective:   Physical Exam  Constitutional: She appears well-developed and well-nourished. No distress.  Cardiovascular: Normal rate, regular rhythm, normal heart sounds and intact distal pulses.   No murmur heard. Pulmonary/Chest: Effort normal and breath sounds normal. No respiratory distress. She has no wheezes.  Abdominal: Soft. Bowel sounds are normal. She exhibits no distension.          Assessment & Plan:

## 2011-04-02 ENCOUNTER — Ambulatory Visit: Payer: Medicaid Other | Attending: Neurology | Admitting: Physical Therapy

## 2011-04-02 DIAGNOSIS — R269 Unspecified abnormalities of gait and mobility: Secondary | ICD-10-CM | POA: Insufficient documentation

## 2011-04-02 DIAGNOSIS — M2560 Stiffness of unspecified joint, not elsewhere classified: Secondary | ICD-10-CM | POA: Insufficient documentation

## 2011-04-02 DIAGNOSIS — M6281 Muscle weakness (generalized): Secondary | ICD-10-CM | POA: Insufficient documentation

## 2011-04-02 DIAGNOSIS — IMO0001 Reserved for inherently not codable concepts without codable children: Secondary | ICD-10-CM | POA: Insufficient documentation

## 2011-04-02 DIAGNOSIS — R5381 Other malaise: Secondary | ICD-10-CM | POA: Insufficient documentation

## 2011-04-03 ENCOUNTER — Encounter: Payer: Self-pay | Admitting: Family Medicine

## 2011-04-03 ENCOUNTER — Ambulatory Visit (INDEPENDENT_AMBULATORY_CARE_PROVIDER_SITE_OTHER): Payer: Medicaid Other | Admitting: Family Medicine

## 2011-04-03 ENCOUNTER — Ambulatory Visit (INDEPENDENT_AMBULATORY_CARE_PROVIDER_SITE_OTHER): Payer: Medicaid Other | Admitting: *Deleted

## 2011-04-03 DIAGNOSIS — I2699 Other pulmonary embolism without acute cor pulmonale: Secondary | ICD-10-CM

## 2011-04-03 DIAGNOSIS — Z7901 Long term (current) use of anticoagulants: Secondary | ICD-10-CM

## 2011-04-03 DIAGNOSIS — Z86718 Personal history of other venous thrombosis and embolism: Secondary | ICD-10-CM

## 2011-04-03 DIAGNOSIS — R0600 Dyspnea, unspecified: Secondary | ICD-10-CM

## 2011-04-03 DIAGNOSIS — R0989 Other specified symptoms and signs involving the circulatory and respiratory systems: Secondary | ICD-10-CM

## 2011-04-03 NOTE — Progress Notes (Signed)
  Subjective:    Patient ID: [name] [name], female    DOB: May 27, 1976, 35 y.o.   MRN: [id number]  HPI Pt with CP and recurrent PE complains of feeling of dyspnea.  She describes feeling like "she was drowning".  She was coming out of the bath when started feeling SOB.  She lives with French Ana, who is helping with her care.  French Ana helped her out of the shower this morning when [name] started feeling dyspneic.  She was not not wearing her oxygen during the shower.  She is on her usual oxygen 2L of oxygen.  On 03/25/11 her INR was 2.9 on Coumadin 10mg  daily.  INR 2.2 today.    Review of Systems  Constitutional: Negative for fever, chills and activity change.  HENT: Negative for congestion, facial swelling, rhinorrhea and sneezing.   Respiratory: Positive for chest tightness and shortness of breath. Negative for apnea, cough, choking, wheezing and stridor.   Cardiovascular: Negative for chest pain, palpitations and leg swelling.      Objective:   Physical Exam  Constitutional: She is oriented to person, place, and time. She appears well-developed and well-nourished. No distress.  Neck: Normal range of motion. Neck supple.  Cardiovascular: Normal rate, regular rhythm, normal heart sounds and intact distal pulses.   No murmur heard. Pulmonary/Chest: Effort normal and breath sounds normal. No respiratory distress. She has no wheezes. She has no rales. She exhibits no tenderness.  Musculoskeletal: She exhibits no edema.  Neurological: She is alert and oriented to person, place, and time.  Skin: She is not diaphoretic.          Assessment & Plan:

## 2011-04-03 NOTE — Assessment & Plan Note (Signed)
Pt is here for INR check and felt dyspneic.  Her pulse ox is 99% on 2L oxygen.  She has needed intermittent use of oxygen since Dx with bilateral lower lobe PE 01/01/11.  She has had dypsnea with exertion (coming out of shower).  She has had serial cardiac enzyme that have been negative.  Pt may likely be deconditioned from spastic CP.  Her spinal contracture has worsened in the past year.  I have tried to have pt work with PT and recently (March) she was in Rehab, but left there after a couple of weeks.  I have referred her to Pulmonology and she has an appointment with Pulm in 2 days 04/05/11 at 4pm.

## 2011-04-05 ENCOUNTER — Telehealth: Payer: Self-pay | Admitting: Family Medicine

## 2011-04-05 MED ORDER — TRAMADOL HCL 50 MG PO TABS
50.0000 mg | ORAL_TABLET | Freq: Four times a day (QID) | ORAL | Status: AC | PRN
Start: 2011-04-05 — End: 2011-04-15

## 2011-04-05 NOTE — Telephone Encounter (Signed)
Pt needed to know what she can take for menstrual cramps.  Cannot take ibuprofen due to hx of blood clots and don't want to take more than prescribed for her hydrocodone because she is only prescribed 30 tabs monthly.  Want call back from nurse.

## 2011-04-05 NOTE — Telephone Encounter (Signed)
Please let pt know that I've sent a Rx for Tramadol 50mg  q6 hr prn pain.

## 2011-04-05 NOTE — Telephone Encounter (Signed)
Patient notified

## 2011-04-05 NOTE — Telephone Encounter (Signed)
Patient states she stopped period on April 21 and then last night it started back. She is having bad cramps. Hydrocodone helped just a little . See previous message  about ibuprofen. Will forward message to Dr. Janalyn Harder.

## 2011-04-11 ENCOUNTER — Encounter: Payer: Self-pay | Admitting: Internal Medicine

## 2011-04-11 ENCOUNTER — Ambulatory Visit (INDEPENDENT_AMBULATORY_CARE_PROVIDER_SITE_OTHER): Payer: Medicaid Other | Admitting: Internal Medicine

## 2011-04-11 VITALS — BP 106/74 | HR 95 | Temp 97.9°F | Ht <= 58 in | Wt 134.8 lb

## 2011-04-11 DIAGNOSIS — I2699 Other pulmonary embolism without acute cor pulmonale: Secondary | ICD-10-CM

## 2011-04-11 NOTE — Patient Instructions (Signed)
Return in 6 months You need coumadin for life When you return, we can decide on ordering the VQ scan We will test your oxygen at night while at sleep My nurse will check your oxygen level at rest on room air

## 2011-04-11 NOTE — Progress Notes (Signed)
  Subjective:    Patient ID: [name] [name], female    DOB: 1976/02/20, 35 y.o.   MRN: [id number]  HPI 35 year old cerebral palsy female, mostly wheel chair bound but can do transfers slowly and walk short distances with walker and support very slowly. REferred by [facility]PT[facility] Dr Cat Ta for opinion on recurrent PE and class 3-4 dyspnea. She presents with a  Caretaker/friend. She is a poor historian due to her disability. Review of charts shows that CT angio chest in 10/22/2001 showed no PE but 9/1/201/2010 had biilateral R > L PE needing o2. Followup CT 08/11/09 showed decreased in size with followup CT 04/20/2010 showed complete resolution of PE and by now no longer needing o2. [name] Kitchen However, 12/31/2010 developed 2nd PE needing o2 again. Fllowup CTs 01/10/2011 and 02/04/2011 show only mild resolution but still persistence of clot. Now, still needing o2. Reports fluctuating INR. Also, reporting dyspnea during transfers and doing some walking. Dr Janalyn Harder is also worried about deconditioning.   Note: I personally reviewed the key images   Review of Systems  Constitutional: Negative for fever and unexpected weight change.  HENT: Positive for congestion. Negative for ear pain, nosebleeds, sore throat, rhinorrhea, sneezing, trouble swallowing, dental problem, postnasal drip and sinus pressure.   Eyes: Negative for redness and itching.  Respiratory: Positive for shortness of breath. Negative for cough, chest tightness and wheezing.   Cardiovascular: Positive for chest pain. Negative for palpitations and leg swelling.  Gastrointestinal: Positive for abdominal pain. Negative for nausea and vomiting.  Genitourinary: Negative for dysuria.  Musculoskeletal: Negative for joint swelling.  Skin: Negative for rash.  Neurological: Positive for headaches.  Hematological: Does not bruise/bleed easily.  Psychiatric/Behavioral: Positive for dysphoric mood. The patient is nervous/anxious.        Objective:   Physical Exam   General:  Wheelchair bound.  Iin no acute distress; alert,appropriate and cooperative throughout examination   Head:  Normocephalic and atraumatic without obvious abnormalities. No apparent alopecia or balding.   Eyes:  No conjunctival injection or discharge   Ears:  External ear exam shows no significant lesions or deformities.  Otoscopic examination reveals clear canals, tympanic  membranes are intact bilaterally without bulging, retraction, inflammation or discharge. Hearing is grossly normal bilaterally.   Nose:  External nasal examination shows no deformity or inflammation. Nasal mucosa are pink and moist without lesions or  exudates. +Crusting at nares   Mouth:  Oral mucosa and oropharynx without lesions or exudates.  Mildly dry mucous membranes   Neck:  No deformities, masses, or tenderness noted.   Lungs:  Normal respiratory effort, chest expands symmetrically. Lungs are clear to auscultation, no crackles or wheezes.  Decreased volumes appreciated on exam with poor movement in bases B   Heart:  Normal rate and regular rhythm. S1 and S2 normal without gallop, murmur, click, rub or other extra sounds.   Extremities:  No erythema, TTP  or swelling of BLE   Neurologic:  Contractures of hands.  Speech dysarthric.  Wheelchair bound      Assessment & Plan:

## 2011-04-16 NOTE — Group Therapy Note (Signed)
NAME:  [name] [name], [name] [name] NO.:  [id number]   MEDICAL RECORD NO.:  [id number]          PATIENT TYPE:  WOC   LOCATION:  [facility]                   FACILITY:  [facility]   PHYSICIAN:  Allie Bossier, MD        DATE OF BIRTH:  1976-01-08   DATE OF SERVICE:                                  CLINIC NOTE   Ms. [name] is a 35 year old white, gravida 2, para 2 who was seen here  as a consult for her dysmenorrhea.  In treatment for her dysmenorrhea  she was started on Depo-Provera, and, in fact, she has not had a period  for the last 3 months.  She has also not had any pelvic pain for the  last 3 months. She gives a history of saying she was diagnosed with  endometriosis at the time of her deliveries.  I was unable to find any  documentation of this.  However, the Depo has relieved her pain and  bleeding.  She wants a hysterectomy.  However, I have explained that  there are potential surgical complications associated with any procedure  and that my recommendation would be to begin with less invasive  treatment first. I have counseled her that since Depo has relieved her  pain and bleeding, my suggestion would be to insert a [name] IUD. I have  also given her the option of an endometrial ablation.  I have explained  that with the endometrial ablation 90% of women have very light periods  and 40%-50% become amenorrheic.  I have given her written and DVD  information on these treatment options, and she will take home and make  a decision per her insurance. If she needs the [name] placed she is to  go to her family doctor, and if she wishes the endometrial ablation I  have told her to call back here and make an appointment.      Allie Bossier, MD     MCD/MEDQ  D:  08/11/2008  T:  08/11/2008  Job:  [id number]

## 2011-04-16 NOTE — Op Note (Signed)
[name] [name], [name] [name]              ACCOUNT NO.:  [id number]   MEDICAL RECORD NO.:  [id number]          PATIENT TYPE:  OIB   LOCATION:  2550                         FACILITY:  [facility]   PHYSICIAN:  Georgia Lopes, M.D.  DATE OF BIRTH:  October 14, 1976   DATE OF PROCEDURE:  07/18/2009  DATE OF DISCHARGE:                               OPERATIVE REPORT   PREOPERATIVE DIAGNOSIS:  Non restorable and impacted teeth numbers 5, 6,  7, 8, 9, 10, 17, 18, 31, and 32.   POSTOPERATIVE DIAGNOSIS:  Non restorable and impacted teeth numbers 5,  6, 7, 8, 9, 10, 17, 18, 31, and 32.   PROCEDURE:  Removal of teeth numbers 5, 6, 7, 8, 9, 10, 17, 18, 31, and  32.   SURGEON:  Georgia Lopes, DMD   ANESTHESIA:  General oral.   PROCEDURE IN DETAIL:  The patient was taken to the operating room,  placed on the table in supine position.  General anesthesia was  administered via the intravenous medications and oral endotracheal tube  was placed and marked.  The patient was draped for the procedure.  Posterior pharynx was suctioned and a throat pack was placed.  Lidocaine  2%  and 1:100,000 epinephrine was infiltrated in an inferior alveolar  block on the right and left sides and in buccal and palatal infiltration  in the anterior maxilla.  A bite block was positioned on the right side  and attention was turned to the left mandible.  Using a #15 blade, a  full-thickness incision was made overlying tooth #17 and carried  proximally to tooth #18 and the distal aspect of tooth #19.  The  periosteum was reflected with a periosteal elevator and bone was removed  with a [employer]ryker handpiece 701 bur under copious irrigation.  Bone was  also removed around tooth #18.  Tooth #18 was removed first and it was  sectioned with the dental drill and removed in pieces.  Then, tooth #17  was removed in similar fashion.  Then, the socket was curetted,  irrigated, and then closed with 3-0 chromic.  The bite block was  repositioned  and then teeth numbers 31 and 32 were approached using a  #15 blade, full-thickness incision proceeding posteriorly overlying  tooth #32 and tearing anteriorly to tooth #30.  The periosteum was  reflected with a periosteal elevator and then bone was removed around  tooth numbers 31 and 32.  Tooth #31 was sectioned and removed and then  tooth #32 was sectioned and removed as well.  Socket was curetted,  irrigated, and closed with 3-0 chromic.  In the anterior maxilla, a full-  thickness incision was used with a #15 blade from tooth #10  to tooth #5  on the buccal and palatal aspect.  Then, the periosteum was reflected.  Teeth #'s 5, 7, 8, 9, and 10 were elevated with the 301 elevator, and  removed with the dental forceps.  Tooth #6 however was impacted and it  was removed using the drill to remove bone around the tooth, and then it  was grasped with forceps and  removed.  Then, these sockets were  curetted, irrigated, and closed with 3-0 chromic.  The oral cavity was  irrigated and suctioned.  The throat pack was removed and the posterior  pharynx was suctioned.  The patient had an oral airway placed and  sponges were placed in the mouth.  Then, the patient was extubated,  taken to recovery room breathing spontaneously in good condition.   ESTIMATED BLOOD LOSS:  Minimum.   COMPLICATIONS:  None.   FINDINGS:  None.      Georgia Lopes, M.D.  Electronically Signed     SMJ/MEDQ  D:  07/18/2009  T:  07/18/2009  Job:  [id number]

## 2011-04-16 NOTE — H&P (Signed)
NAMEKLEA, NALL NO.:  [id number]   MEDICAL RECORD NO.:  [id number]          PATIENT TYPE:  INP   LOCATION:  4736                         FACILITY:  [facility]   PHYSICIAN:  Paula Compton, MD        DATE OF BIRTH:  12-Jul-1976   DATE OF ADMISSION:  08/02/2009  DATE OF DISCHARGE:                              HISTORY & PHYSICAL   PRIMARY CARE PHYSICIAN:  Dr. Angeline Slim.   CHIEF COMPLAINT:  Chest pain and shortness of breath.   HISTORY OF PRESENT ILLNESS:  This is a 35 year old with severe cerebral  palsy, athetoid, seen for work-in today who presents with a 5-day  history of chest pain and shortness of breath.  Chest pain is  characterized as pressure substernally, like five bricks laying on  chest, worse with exertion and improved with rest.  She states her  shortness of breath is more concerning because it has remained  throughout the entire day.  She called her cardiologist 2 days ago and  was advised to go to the ED for the same symptoms where she received an  aspirin and electrocardiogram, which was read normal, as well as point  of care enzymes times 1 which were normal.  They also checked a CMP and  a CBC, which were within normal limits, and a chest x-ray which was  without acute process.  She was sent home with reassurance and follow-up  with PCP.  It does not sound like she received any sublingual  nitroglycerin.  The patient denies cough, leg swelling, urinary changes,  nausea or abdominal pain or pain radiating down the left arm.  She is  not a smoker but does have passive smoke exposure occasionally.  She has  no history of asthma, but she states that when she goes into the  hospital she does receive albuterol nebs which seem to help.  Of note,  she has had a recent dental surgery about 2 weeks ago with extraction of  10 teeth.  Also, as far as cardiac history she is followed by a  cardiologist for a question of palpitations in the remote past and a  history of tachycardia which resolved with metoprolol.   ALLERGIES:  No known drug allergies.   MEDICATIONS:  1. Baclofen 10 mg q.i.d.  2. Toprol XL 100 mg daily.  3. Seroquel 200 mg 1 p.o. nightly.  4. Effexor XR 150 mg 1 p.o. daily per psych.  5. Depo shot every 3 months.  6. Diclofenac 50 mg p.r.n. pain b.i.d.  7. Detrol 1 mg 1 p.o. b.i.d.  8. Albuterol p.r.n.  9. Lamictal 150 mg per neurology.  10.Klonopin 1 mg per neurology.  11.Topamax 25 mg per neurology.  12.Zanaflex 2 mg per neurology.  The last 4 medications unsure sig.   PAST MEDICAL HISTORY:  1. History of migraines.  2. History of spastic quadriparesis.  3. Cerebral palsy, athetoid, due to hypoxic injury in the remote past.  4. Degenerative joint disease.  5. Dysarthria with mild dysphasia.  6. Recurrent UTIs.  7. Endometriosis.  8. GERD.  9. History  of gestational diabetes.   PAST SURGICAL HISTORY:  1. Baclofen pump insertion in 2000.  2. C-section times 2.  3. BTL 2003.  4. Gallbladder ultrasound normal in 1998.  5. Laparoscopic excision of endometriosis and LUNA procedure in 2002.  6. Left carpal tunnel release in 2009.  7. Most recently, oral surgery for tooth extraction in August of this      year.   FAMILY HISTORY:  Nonsignificant for diabetes, coronary artery disease or  hypertension.  She does have 1 grandmother who has colon cancer and  another grandmother with breast cancer.   SOCIAL HISTORY:  The patient is currently single and has 2 children.  She takes care of her 75-year-old daughter, [name], her 65 year old son  lives with her parents.  She needs assistance with ADLs and IADLs.  She  has a Production designer, theatre/television/film.  Denies alcohol, recreational drugs  or smoking and she is currently going to [facility] for criminal justice.   PHYSICAL EXAMINATION:  VITALS:  Temperature 97.2, pulse 92, blood  pressure 110/74, oxygen saturation 92% on room air, weight thorough 156  pounds.  GENERAL:   Wheelchair-bound, breathing heavier than normal.  Takes break  between complete sentences.  HEENT:  Normocephalic, atraumatic.  Pupils equally round, reactive to  light.  Extraocular movements intact.  Poor dentition in the mouth but  moist mucous membranes.  LUNGS:  Clear to auscultation bilaterally.  No crackles or wheezing.  HEART:  Normal S1-S2.  No murmurs, rubs or gallops noted.  ABDOMEN:  Bowel sounds positive, nontender, nondistended.  Pulses 2+  radial pulses.  EXTREMITIES:  No edema.  There is some mild tenderness upon palpation of  right calf.  NEUROLOGICAL:  athetoid quadriparesis, wheelchair-bound.  The patient  does get around by crawling on her arms.  SKIN:  Calluses bilateral ankles.   Labs and imaging done on Monday included an electrocardiogram which was  read within normal limits, normal sinus rhythm.  I do not have  record  of that one done, but one done in the clinic today showing questionable  lead reversal and absent R-wave progression, nonspecific ST changes and  T-wave abnormality in lateral leads, right axis deviation.  Labs  obtained in the ER revealed creatinine of 0.75, hemoglobin of 13.2 and  point of care enzymes negative times 1.  Chest x-ray done in the ER also  showed no acute process but a nodular left hilar density 8.5-mm in  diameter and they recommended follow-up with a CT scan.   ASSESSMENT AND PLAN:  A 35 year old with cerebral palsy who presents  with a 5-day history of shortness of breath and chest pain.  1. Shortness of breath.  Differential includes pulmonary embolism      versus cardiac versus other.  Difficult to get complete accurate      story from the patient.  She is also somewhat difficult to      understand.  I recommended patient to be admitted for rule out      pulmonary embolism.  She does have right calf pain, so will obtain      Dopplers.  Will check a spiral CT of the lungs to rule out      pulmonary embolism as well as to  follow up on the nodule found 2      days ago in the emergency room.  Will check D-dimer once she is      admitted to the hospital and if elevated start heparin.  Cardiac      enzymes times 3, rule out acute coronary syndrome with a repeat      electrocardiogram in the morning as well.  Repeat electrocardiogram      upon admission as there was a question as to whether the one done      in clinic was an acceptable one.  2. Chest pain.  See number 1.  Continue aspirin, give nitroglycerin      sublingual and see if it helps.  She did receive one in clinic and      it did not seem to relieve her chest pain.  She does have low risk      for cardiac etiology given she does not have many risk factors.      She is a nonsmoker and has normal cholesterol which was last      checked in 2008 and does not have a history of diabetes.  She also      a well controlled hypertensive on metoprolol.  Regardless, we will      admit to telemetry and continue to monitor and rule out with      cardiac enzymes x3.  3. Contractures.  Continue to monitor baclofen.  4. Pulmonary nodule. Follow-up with CT scan.  5. Cerebral palsy, stable.  Continue medications per psychiatry and      neurology.  6. Depression, anxiety.  Continue medications per psychiatry and      neurology.  7. Fluids, electrolytes, nutrition/gastrointestinal.  Regular diet.      Hep-Lock intravenously.  8. Prophylaxis.  Heparin prophylaxis dosing for now.  Check D-dimer      and then consider starting therapeutic.  9. Disposition.  Hopeful for overnight stay pending workup.      Eustaquio Boyden, MD  Electronically Signed      Paula Compton, MD  Electronically Signed    JG/MEDQ  D:  08/03/2009  T:  08/03/2009  Job:  [id number]

## 2011-04-16 NOTE — Discharge Summary (Signed)
[name] [name], [name] [name] NO.:  [id number]   MEDICAL RECORD NO.:  [id number]          PATIENT TYPE:  IPS   LOCATION:  0500                          FACILITY:  [facility]   PHYSICIAN:  Geoffery Lyons, M.D.      DATE OF BIRTH:  [birth year]   DATE OF ADMISSION:  09/29/2008  DATE OF DISCHARGE:  10/03/2008                               DISCHARGE SUMMARY   CHIEF COMPLAINT/PRESENT ILLNESS:  This was the 2nd admission to [facility] for this 35 year old female with cerebral palsy  and spastic quadriplegia.  Feeling suicidal, guilty with a lot of shame  after she got into a relationship with a man who was a registered stress  offender.  She did not know this.  Her ex-husband found out, and now he  is blaming her as this  person has being around her children.  She  almost moved him into her home with her 33-year-old.  Feeling guilty,  depressed.   PAST PSYCHIATRIC HISTORY:  She was admitted in 2007.   MEDICAL HISTORY:  1. Cerebral palsy.  2. Sinus tachycardia.  3. Migraine headaches.   MEDICATIONS:  1. She is on Lamictal 150 mg at bedtime.  2. Effexor XR 300 mg in the morning.  3. Oxybutynin CR 5 mg at night.  4. Neurontin 300 mg at night.  5. Tizanidine 2 mg 2 at night.  6. Macrobid 5 mg 1 at night.  7. Clonazepam 1 mg 4 times a day as needed.  8. Topiramate 25 mg 2 at bedtime.  9. Metoprolol 200 mg at bedtime.  10.Hydrocodone 5/500 one every 4-6 hours as needed for pain.  11.Seroquel XR 200 mg at night.  12.__________ daily.  13.Baclofen 10 mg twice a day as needed.   PHYSICAL EXAMINATION:  Failed to show any acute findings.   LABORATORY WORKUP:  White blood cells 8.8, hemoglobin 12.8.  Sodium 139,  potassium 4.1, glucose 100.  SGOT 17, SGPT 15.  Total bilirubin 0.8.   MENTAL STATUS EXAM:  Revealed an alert and cooperative female in a  wheelchair.  Mood is depressed.  Affect depressed.  Speech hesitant,  dysarthric at times.  Thought processes logical,  coherent, and relevant,  some passive suicidal thoughts.  No plan. Feeling very guilty for having  allowed this man in her life and near her child, although nothing  happened as she always was there.  No homicidal ideas, no delusions, no  hallucinations.  Cognition well-preserved.   ADMITTING DIAGNOSES:  Axis I:  Major depressive disorder.  Axis II:  No diagnosis.  Axis III:  Cerebral palsy, migraine headaches.  Axis IV:  Moderate.  Axis V:  Upon admission 40, highest global assessment of functioning in  the last year 60.   COURSE IN THE HOSPITAL:  She was admitted.  Started in individual and  group psychotherapy.  As already stated a 35 year old female with  cerebral palsy who endorsed being obsessing.  She found out that a female  she was very close to and was even thinking of allowing to move in with  her and her  59-year-old is actually a registered sex offender in another  state.  She has another daughter who stays with her father.  This 6-year-  old stays with her.  This female is a 37 year old, and he was currently  incarcerated for probation violation.  He was going to be getting out  next Sunday.  The guy is living in the  same apartment complex.  She  does not want to pursue a relationship, but is concerned she really  cares about him to a point that she was going to allow him to stay in  the same house.  Had been seen at the [facility].  Had been on  Seroquel, Effexor, Lamictal, and Klonopin.  The next 40 hours she  continued to get better.  She was able to work a plan where she was not  going to allow this gentleman to come back in; that she was going to  notify him that she was not going to pursue the relationship anymore.  She felt supported.  She felt better.  She wanted to be home for her  daughter.  We went ahead and discharged to outpatient follow-up.   DISCHARGE DIAGNOSES:  Axis I:  Major depressive disorder.  Axis II:  No diagnosis.  Axis III:  Cerebral palsy,  migraine headaches.  Axis IV:  Moderate.  Axis V:  Upon discharge 50-55.   Discharged on:   1. Lamictal 100 mg 1-1/2 daily.  2. Effexor XR 150 mg 2 tabs at bedtime.  3. Ditropan XL 5 mg at bedtime.  4. Topiramate 25 mg 2 tabs at night.  5. Seroquel XR 200 mg 1 daily.  6. Klonopin 1 mg 1 four times a day as needed.  7. Neurontin 200 mg 1 at bedtime.  8. Zanaflex 4 mg at bedtime.  9. Macrodantin 50 mg 1 tab at bedtime.  10.Toprol XL 100 mg 1 tab at night.  11.Baclofen 10 mg 1 twice a day.  12.MiraLax 17 g daily.  13.Ibuprofen 800 mg 1 three times a day as needed.  14.Vicodin 5/325 1 tablet every 4-6 hours as needed.   FOLLOWUP:  Follow-up by [facility].      Geoffery Lyons, M.D.  Electronically Signed    IL/MEDQ  D:  11/02/2008  T:  11/03/2008  Job:  [id number]

## 2011-04-16 NOTE — Op Note (Signed)
NAMEMEHREEN, AZIZI NO.:  [id number]   MEDICAL RECORD NO.:  [id number]          PATIENT TYPE:  INP   LOCATION:  3301                         FACILITY:  [facility]   PHYSICIAN:  Deanna Artis. Hickling, M.D.DATE OF BIRTH:  [birth year]   DATE OF PROCEDURE:  12/08/2007  DATE OF DISCHARGE:                               OPERATIVE REPORT   PROCEDURE:  Interrogation and reprogramming of intrathecal baclofen  pump.   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  In  August she had reimplantation of a 40 mL Synchromed II intrathecal  baclofen pump.  The patient did well with it until about a month ago  when she began to have increasing problems with spasticity.  During a  routine pump refill, rather than 2.5 mL within the pump, 20.5 mL was in  the pump, indicating that the patient had some problem either with the  pump of obstruction with the tubing.   The patient was brought to the hospital for a pump study.  The rotor was  moving well.  The tubing however, was severely kinked.  Turned out that  the device was flipping back and forth and with it the tubing became  twisted.   PROCEDURE IN DETAIL:  The patient was taken to surgery yesterday and the  pump was repositioned and the tubing was changed.  She tolerated the  procedure well.  Previously she had received four 80 mcg boluses at 6-  hour intervals and a total dose per day of 1392 mcg.  Yesterday after  the pump was implanted in order prevent overdose, the patient had  boluses of 12.5 mcg every 6 hours and a basal rate of 8 mcg per hour for  total dose of 233.9 mcg per day.   This morning the patient was quite rigid in her extremities.  She was  not excessively sleepy although she was tired because she had not slept  well.  I reprogrammed the pump to basal rate of 12 mcg per hour and  increased the bolus dose to 20 mcg every 6 hours.   This is a total daily  dose of 356.1 mcg.  Reservoir line currently is 35.6 mL.  The patient  tolerated the procedure well.  I suspect that the actual fluid within  the reservoir is greater than the telemetry estimates because of the  kinking of tubing.  The patient's current reservoir alarm date is June 13, 2008 which is about 6 months after installation of fluid.  We will  monitor the patient's progress and have her back for further  reprogramming depending upon her clinical response.      Deanna Artis. Sharene Skeans, M.D.  Electronically Signed     [facility]/MEDQ  D:  12/08/2007  T:  12/08/2007  Job:  [id number]

## 2011-04-16 NOTE — Op Note (Signed)
NAMEBETHANNE, MULE NO.:  [id number]   MEDICAL RECORD NO.:  [id number]          PATIENT TYPE:  INP   LOCATION:  3301                         FACILITY:  [facility]   PHYSICIAN:  Cristi Loron, M.D.DATE OF BIRTH:  Jul 09, 1976   DATE OF PROCEDURE:  12/07/2007  DATE OF DISCHARGE:                               OPERATIVE REPORT   BRIEF HISTORY:  The patient is a 35 year old white female who has  cerebral palsy and is spastic.  She has undergone placement of a  baclofen pump with good results and about 6 months ago underwent a  replacement of her baclofen pump because the battery was running low.  She has had several refills of her baclofen pump since then but most  recently there was a discrepancy in the amount of medication that had  been given.  Dr. Sharene Skeans, her neurologist, then performed a tap of her  pump under fluoroscopy and noted that the pump head had flipped over,  and there appeared to be a kink at the catheter.  He discussed with me,  and we recommended she have this revised.  I discussed the risks,  benefits, and alternatives of surgery to the patient and her father this  morning, described baclofen pump revision as well as the risk.  She  decided to proceed with the surgery.  I answered all their questions.   PREOPERATIVE DIAGNOSIS:  Baclofen pump malfunction.   POSTOPERATIVE DIAGNOSIS:  Baclofen pump malfunction.   PROCEDURE:  Revision of Baclofen pump.   SURGEON:  Cristi Loron, MD   ASSISTANT:  None.   ANESTHESIA:  General endotracheal.   ESTIMATED BLOOD LOSS:  Minimal.   SPECIMENS:  None.   DRAINS:  None.   COMPLICATIONS:  None.   DESCRIPTION OF PROCEDURE:  The patient was brought to the operating room  by the anesthesia team.  General endotracheal anesthesia was induced.  The patient was turned to the lateral position with the right side up.  Her right abdomen, flank, and lumbar region were then prepared with  Betadine scrub and  Betadine solution.  Sterile drapes were applied.  I  then used a scalpel to incise through the patient's right abdominal  incision.  I used electrocautery to expose the underlying baclofen pump.  Of note, the pump was in a cavity with a little bit of fluid around it.  The walls of the cavity were quite smooth, and I could see the pump was  able to move around in this cavity.  I then inspected the catheter.  I  could see that it had been twisted many times, and there were multiple  kinks in it, as the pump head had flipped on multiple occasions.  I then  removed the pump from the pocket and at this point, it was noted that  the catheter had fractured, perhaps 20-30 cm from the pump.  I then  disconnected the old catheter and connector from the pump.  We soaked  the pump in bacitracin solution and then made an incision at the  patient's old lumbar wound and removed the fastener as  well as the  thecal catheter and the remainder of the subcutaneous catheter.  I  removed the remainder of the catheter.  I placed a figure-of-eight  suture in the exit site from the thecal catheter to prevent spinal fluid  leakage.  At this point, we needed to replace the thecal catheter.  Under fluoroscopic guidance, I performed a lumbar puncture with a Tuohy  needle and threaded the new catheter through the Tuohy needle to  approximately 30 cm.  I then removed the Tuohy needle and the stylet.  There was a good free flow of CSF to the distal end of the catheter.  We  then used a shunt passer down to pass the catheter from the lumbar  incision to abdominal incision.  I shortened the catheter by 20.5 cm and  then placed a connector and connected it, and again noted there was free  flow of CSF through the catheter at this point through the abdominal  incision.  I then placed a mesh around the baclofen pump and then we  connected the new thecal catheter to the pump with the sutureless  connectors.  I then secured the  mesh surrounding the pump to the lining  of this cavity using multiple 2-0 silk sutures.  The pump at this point  was quite secure.  I then reapproximated the patient's subcutaneous  tissue and abdominal tissue with interrupted 2-0 Vicryl suture and the  skin with Steri-Strips and Benzoin.  I did the same in the lumbar  incision.  The wounds were then coated with bacitracin ointment.  A  sterile dressing was applied.  Drapes were removed.  The patient was  subsequently extubated by the anesthesia team and transported to the  post-anesthesia care unit in a stable condition.  All sponge,  instrument, and needle counts were correct at the end of this case.      Cristi Loron, M.D.  Electronically Signed     JDJ/MEDQ  D:  12/07/2007  T:  12/08/2007  Job:  [id number]

## 2011-04-16 NOTE — Op Note (Signed)
NAMEDOYLE, TEGETHOFF NO.:  [id number]   MEDICAL RECORD NO.:  [id number]          PATIENT TYPE:  INP   LOCATION:  3041                         FACILITY:  [facility]   PHYSICIAN:  Cristi Loron, M.D.DATE OF BIRTH:  02/01/1976   DATE OF PROCEDURE:  06/15/2007  DATE OF DISCHARGE:                               OPERATIVE REPORT   PREOPERATIVE DIAGNOSIS:  Baclofen pump malfunction (low battery alarm).   POSTOPERATIVE DIAGNOSIS:  Baclofen pump malfunction (low battery alarm).   PROCEDURE:  Replacement of baclofen pump.   SURGEON:  Cristi Loron, M.D.   ASSISTANT:  None.   ANESTHESIA:  General endotracheal.   ESTIMATED BLOOD LOSS:  Minimal.   SPECIMENS:  None.   DRAINS:  None.   COMPLICATIONS:  None.   BRIEF HISTORY:  The patient is a 35 year old white female, without was  born with cerebral palsy and spastic diplegia.  She had a baclofen pump  placed by me about 8 years ago and did quite well with this.  More  recently, during a routine filling of the baclofen pump, it was noted  that the patient's low battery alarm was sounding. The patient was sent  to me to replace her baclofen pump.  I discussed the treatment options,  as well as the option of replacement of the patient's baclofen pump and  possibly intrathecal catheter.  The patient weighed the risks, benefits  and alternatives of surgery and decided to proceed with this operation.   DESCRIPTION OF PROCEDURE:  The patient was brought to the operating room  by the anesthesia team.  General endotracheal anesthesia was induced.  The patient was turned to the lateral position with the right side up.  Her right abdomen, flank and lumbar region was then prepared with  Betadine scrub and Betadine solution.  Sterile drapes were applied.  I  injected the area to be incised with a Marcaine with epinephrine  solution.  I then incised the patient's previous right upper quadrant  abdominal scar and used  electrocautery to expose the underlying baclofen  pump.  I used electrocautery to incise through the patient's mesh  surrounding the baclofen pump.  I then removed the pump, and then  subsequently removed the mesh using the Metzenbaum scissors and  electrocautery.  I then disconnected the intrathecal catheter from the  baclofen pump by ligating the silk suture ties.  There was spontaneous  free flow of cerebrospinal fluid from the proximal intrathecal catheter.  Then, to be doubly sure that the catheter was patent, I used a syringe  and aspirated 5 mL of cerebrospinal fluid, indicating the catheter's  patency and removing the concentrated baclofen from the intrathecal  catheter.  I then noted that there was a mild kink in the catheter just  proximal to the place for the connector, so I decided to put a new  connector.  I cut off about 3 cm of the intrathecal catheter to remove  this kink, and then connected it to a new plastic connector and secured  this in place with a silk tie.  We then prepared the new  [employer]  baclofen pump by removing the saline and placing 20 mL of 2000 mcg per  mL baclofen solution.  I then connected the catheter connector to the  pump and secured it with a silk tie.  I then placed the pump back into  the subcutaneous cavity where the old pump was, being careful not to  kink the catheter.  We then reapproximated the patient's subcutaneous  tissue with interrupted 2-0 Vicryl suture and the skin with Steri-Strips  and benzoin.  The wound was then coated with Bacitracin ointment and  sterile dressings were applied.  The drapes were removed, and we then  used the telemetry to prime the pump and the catheter, and the baclofen  pump was then returned to the same settings as the old pump.  This  completed the operation.  A sterile dressing was applied.  The drapes  were removed, and the patient was subsequently extubated by the  anesthesia team and transported to the  postanesthesia care unit in  stable condition.  All sponge, instrument and needle counts were correct  at the end of this case.      Cristi Loron, M.D.  Electronically Signed     JDJ/MEDQ  D:  06/15/2007  T:  06/15/2007  Job:  [id number]

## 2011-04-16 NOTE — Op Note (Signed)
[name] [name], [name] [name]              ACCOUNT NO.:  [id number]   MEDICAL RECORD NO.:  [id number]          PATIENT TYPE:  AMB   LOCATION:  DSC                          FACILITY:  [facility]   PHYSICIAN:  Katy Fitch. Sypher, M.D. DATE OF BIRTH:  04-29-1976   DATE OF PROCEDURE:  09/01/2008  DATE OF DISCHARGE:                               OPERATIVE REPORT   PREOPERATIVE DIAGNOSES:  1. 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  2. History of seizure disorder.  3. Depression.  4. Migraine headaches.   POSTOPERATIVE DIAGNOSES:  1. 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  2. History of seizure disorder.  3. Depression.  4. Migraine headaches.   OPERATION:  Release of left transverse carpal ligament.   SURGEON:  Katy Fitch. Sypher, MD   ASSISTANT:  Marveen Reeks Dasnoit, PA-C   ANESTHESIA:  General by LMA.   SUPERVISING ANESTHESIOLOGIST:  Janetta Hora. Gelene Mink, MD   INDICATIONS:  [name] [name] is a 35 year old woman with multiple  medical problems.  She is referred for evaluation and management of  chronic pain and numbness of the left hand.  Clinical examination  suggested probable severe carpal tunnel syndrome.  Electrodiagnostic  studies completed by Dr. Johna Roles confirmed very severe left carpal  tunnel syndrome.   Due to a failure to respond to nonoperative measures, she is now brought  to the operating room at this time for release of her left transverse  carpal ligament.   PROCEDURE:  [name] [name] was brought to the operating room and placed  in a supine position upon the operating table.  Following an anesthesia  consult by Dr. Gelene Mink, general anesthesia was recommended and  accepted by [name] [name] and her father who accompanies her to surgery.   She was brought to room 3, placed in supine position upon the operating  table and under Dr.  Thornton Dales direct supervision, general anesthesia  by LMA technique induced.   The left arm was prepped with Betadine soap solution and sterilely  draped.  A pneumatic tourniquet was applied to the proximal left  brachium.  Upon exsanguination of the left arm with Esmarch bandage,  arterial tourniquet was inflated to 220 mmHg.  The procedure commenced  with a short incision in the line of the ring finger and the palm.  Subcutaneous tissues were carefully divided taking care to identify the  palmar fascia.  This was split in the line of its fibers to reveal the  common sensory branch of the median nerve and superficial palmar arch.  Carpal canal was sounded with Penfield 4 elevator followed by separation  of the tenosynovium from the deep surface of the transverse carpal  ligament.  The ligament was released with scissors along its ulnar  border extending into the distal forearm.  The volar forearm fascia was  likewise released 3 cm above the distal wrist flexion crease.  This  widely opened the carpal canal.  The ulnar bursa was noted to be  fibrotic.  A segment of the median nerve was noted to be hyperemic.   Bleeding points along the margin of the released ligament and subdermal  region were electrocauterized with bipolar current followed by repair of  the skin with intradermal 3-0 Prolene and a Steri-Strip.  A compressive  dressing was applied with a Tegaderm followed by sterile gauze, sterile  Webril and an oversized volar splint of plaster to protect the incision.   For aftercare [name] [name] was transferred to the recovery room with  stable signs.  She will be discharged to the care of her family with  prescriptions for Dilaudid 2 mg one p.o. q. 4-6 hours p.r.n. pain 20  tablets without refill.  We will see her back for followup in 1 week for  dressing change, suture removal, and advancement into a compression  bandage.      Katy Fitch Sypher, M.D.  Electronically  Signed     RVS/MEDQ  D:  09/01/2008  T:  09/02/2008  Job:  [id number]   cc:   Deanna Artis. Sharene Skeans, M.D.  William A. Leveda Anna, M.D.

## 2011-04-16 NOTE — Assessment & Plan Note (Signed)
[facility]                            CARDIOLOGY OFFICE NOTE   ELON, LOMELI                     MRN:          [id number]  DATE:06/01/2008                            DOB:          May 04, 1976    NEUROLOGIST:  Deanna Artis. Sharene Skeans, MD   PRIMARY CARE PHYSICIAN:  Pearlean Brownie, MD   HISTORY OF PRESENT ILLNESS:  Ranessa is a 35 year old woman with history  of cerebral ,as well as sinus tachycardia and migraine headaches, who  presents today for a routine followup.  She denies any palpitations or  shortness of breath.  She did have a migraine the other day and when she  went to her doctor, her blood pressure was reportedly 150/87.  She does  not have problems with lower extremity edema.  She is taking her Toprol.   CURRENT MEDICATIONS:  1. Effexor 300 at bedtime.  2. Seroquel 200 at bedtime.  3. Baclofen 10 mg q.i.d.  4. Lamictal 100 a day.  5. Toprol XL 100 a day.  6. Klonopin.  7. Zanaflex.  8. Mepergan.   PHYSICAL EXAMINATION:  GENERAL:  She is in a wheelchair and she is  dysarthric and has some contractions, consistent with her cerebral  palsy.  VITAL SIGNS:  Respirations are unlabored.  Blood pressure is 120/80 and  heart rate is 80.  HEENT:  Normal except for poor dentition.  NECK:  Supple.  No JVD,  2+ bilateral bruits.  There is no  lymphadenopathy or thyromegaly.  CARDIAC:  PMI is nondisplaced.  Regular rate and rhythm.  No murmurs,  rubs, or gallops.  LUNGS:  Clear.  ABDOMEN:  Soft, nontender, and nondistended.  No hepatosplenomegaly.  No  bruits.  No masses.  EXTREMITIES:  Warm with no cyanosis, clubbing, or edema.  Change is  consistent with cerebral palsy.   EKG shows normal sinus rhythm at a rate of 80.  No ST-T wave  abnormalities.   ASSESSMENT:  History of sinus tachycardia.  Her heart rate is much  improved on Toprol.  No active cardiac issues.  I will see her back on a  p.r.n. basis.     Bevelyn Buckles.  Bensimhon, MD  Electronically Signed    DRB/MedQ  DD: 06/01/2008  DT: 06/02/2008  Job #: [id number]   cc:   Deanna Artis. Sharene Skeans, M.D.  Pearlean Brownie, M.D.

## 2011-04-17 ENCOUNTER — Ambulatory Visit (INDEPENDENT_AMBULATORY_CARE_PROVIDER_SITE_OTHER): Payer: Medicaid Other | Admitting: *Deleted

## 2011-04-17 ENCOUNTER — Ambulatory Visit: Payer: Medicaid Other | Admitting: Physical Therapy

## 2011-04-17 ENCOUNTER — Ambulatory Visit (INDEPENDENT_AMBULATORY_CARE_PROVIDER_SITE_OTHER): Payer: Medicaid Other | Admitting: Family Medicine

## 2011-04-17 VITALS — BP 129/85 | HR 109 | Temp 98.4°F

## 2011-04-17 DIAGNOSIS — R0600 Dyspnea, unspecified: Secondary | ICD-10-CM

## 2011-04-17 DIAGNOSIS — Z7901 Long term (current) use of anticoagulants: Secondary | ICD-10-CM

## 2011-04-17 DIAGNOSIS — Z86718 Personal history of other venous thrombosis and embolism: Secondary | ICD-10-CM

## 2011-04-17 DIAGNOSIS — I2699 Other pulmonary embolism without acute cor pulmonale: Secondary | ICD-10-CM

## 2011-04-17 DIAGNOSIS — IMO0002 Reserved for concepts with insufficient information to code with codable children: Secondary | ICD-10-CM | POA: Insufficient documentation

## 2011-04-17 DIAGNOSIS — R0989 Other specified symptoms and signs involving the circulatory and respiratory systems: Secondary | ICD-10-CM

## 2011-04-17 DIAGNOSIS — R0609 Other forms of dyspnea: Secondary | ICD-10-CM

## 2011-04-17 DIAGNOSIS — N949 Unspecified condition associated with female genital organs and menstrual cycle: Secondary | ICD-10-CM

## 2011-04-17 LAB — POCT INR: INR: 2.7

## 2011-04-17 NOTE — Patient Instructions (Signed)
Please make an appt to see me in June for follow up.  I will try to call the GYN doctors at [facility] about your IUD appt.

## 2011-04-17 NOTE — Assessment & Plan Note (Signed)
On bilateral knees.  No signs of infection.  Will continue to apply antibiotic ointment.  No systemic antibiotic needed at this time.

## 2011-04-17 NOTE — Assessment & Plan Note (Addendum)
Pt has menstrual pain and multiple periods (2/month) from endometriosis. She has an appt with [facility] GYN for IUD placement on 04/30/11.  Since has has difficulty spreading her legs because cerebral palsy causes much pan. She is asking for an epidural for anesthesia for the procedure.   I will try to call the GYN providers to discuss [name]'s concerns.  Explained to [name] today that I do not have any say because [facility] will be providing care, but I will try to speak with them on her behalf.  She has vicodin #30 per month and #30 ultram per month for pain.
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
## 2011-04-18 ENCOUNTER — Ambulatory Visit: Payer: Medicaid Other | Admitting: Physical Therapy

## 2011-04-19 NOTE — H&P (Signed)
NAMESHATASIA, CUTSHAW NO.:  [id number]   MEDICAL RECORD NO.:  [id number]          PATIENT TYPE:  INP   LOCATION:                               FACILITY:  [facility]   PHYSICIAN:  Anastasio Auerbach, MD       DATE OF BIRTH:  [birth year]   DATE OF ADMISSION:  02/12/2005  DATE OF DISCHARGE:                                HISTORY & PHYSICAL   CHIEF COMPLAINT:  Cellulitis.   HISTORY OF PRESENT ILLNESS:  This is a 35 year old white female with a  history of cerebral palsy with quadriparesis who was asked to be seen today  by Dr. Sharene Skeans for worrisome cellulitis that was noted in his office. The  patient reports that she has notices erythema for about five days on her  right lower extremity and draining fluid for the past two days. It is  painful to touch. She is unable to crawl at home which is her primary mode  of transportation besides her wheelchair.   REVIEW OF SYSTEMS:  Positive for no fever that she is aware. No shortness of  breath. Very nauseated. Headache x3 days on and off. Otherwise negative.   PAST MEDICAL HISTORY:  1.  Significant for depression and obsessive compulsive disorder.  2.  Cerebral palsy.  3.  Chronic constipation.  4.  Amenorrhea.   MEDICATIONS:  1.  Baclofen 10 mg p.o. q.6h. and baclofen pump.  2.  Effexor XR 150 mg p.o. q.d.  3.  MiraLax 17 g p.o. q.d.  4.  Protonix 40 mg p.o. q.d.  5.  __________________ mg p.o. q.d.  6.  Seroquel 300 mg p.o. q.h.s.  7.  Tylenol with codeine q.4h. p.r.n.  8.  Xanax 0.5 mg p.o. t.i.d.  9.  Zanaflex 2 mg p.o. q.a.m., 4 mg at noon, 2 mg q.p.m., 4 mg q.h.s.   ALLERGIES:  None known.   FAMILY HISTORY:  No history of diabetes, coronary artery disease, or  hypertension. She has one grandmother diagnosed with colon cancer.   SOCIAL HISTORY:  Patient with lives with common law husband who has  schizophrenia and mental retardation. The patient needs assistance with her  ADLs and has personal care services  approximately 20 hours per week but  should be soon to include the weekend. The patient does not smoke, drink, or  do any drugs, and she uses an electric wheelchair for transportation outside  of the home.   PHYSICAL EXAMINATION:  VITAL SIGNS:  Temperature 102.0, blood pressure  108/66, pulse 126, respiratory rate of 20.  GENERAL:  She is alert, in no apparent distress, in electric wheelchair.  HEENT:  Pupils are equal, round, and reactive to light. Oropharynx clear.  Poor dentition. No thyromegaly. No adenopathy.  CARDIOVASCULAR:  Tachycardia. No murmurs, rubs, or gallops. Two plus  peripheral pulses. No peripheral edema aside from cellulitis.  LUNGS:  Clear to auscultation bilaterally with good effort.  ABDOMEN:  Positive bowel sounds, soft, no noted organomegaly or masses.  SKIN:  12 x 7 cm area on the right lower extremity just below the knee on  the  medial aspect of the leg, erythematous, warm, indurated, no fluctuance.  Has a 3 cm central area of necrosis with purulent drainage.  NEUROLOGICAL:  Quadriparesis at baseline.   LABORATORY DATA:  Pending CMET, CBC with differential and blood cultures.   ASSESSMENT/PLAN:  A 35 year old white female with cellulitis and abscess.   1.  Abscess/cellulitis. Will admit the patient for IV antibiotics      considering social situation and systemic symptoms. Will begin with      Ancef and watch closely for worsening symptoms as patient may need      vancomycin.  2.  Psychiatric. The patient has a history of OCD and depression and will      continue home medications. The patient is currently stable.  3.  Quadriparesis. Will continue her baclofen and Tylenol #3 p.r.n.       ___________________________________________  Anastasio Auerbach, MD    AD/MEDQ  D:  02/12/2005  T:  02/12/2005  Job:  [id number]

## 2011-04-19 NOTE — Consult Note (Signed)
[name], [name] NO.:  [id number]   MEDICAL RECORD NO.:  [id number]          PATIENT TYPE:  INP   LOCATION:  3016                         FACILITY:  [facility]   PHYSICIAN:  Bevelyn Buckles. Bensimhon, MDDATE OF BIRTH:  11-02-1976   DATE OF CONSULTATION:  09/11/2006  DATE OF DISCHARGE:  09/12/2006                                   CONSULTATION   REASON FOR CONSULTATION:  Tachycardia.   HISTORY OF PRESENT ILLNESS:  [name] is a 35 year old woman with  cerebral palsy due to prematurity.  Unfortunately, she has been wheelchair  bound and suffering from severe dysarthria.  She was admitted for video EEG  monitoring due to periods of unawareness followed by confusion with a  question of underlying seizures.  While in the hospital, it has been noted  that she has had intermittent tachycardia with heart rates up to 150.  She  is relatively asymptomatic.  She denies any chest pain or palpitations,  though she does have some shortness of breath which she says is chronic.  She denies any history of known heart disease.  She has not been aware of a  previous tachycardia.  Currently, she is hooked up to a video ECG and has  not had any complaints.   PAST MEDICAL HISTORY:  1. Cerebral palsy wheelchair bound was significant the dysarthria.  2. Endometriosis.   REVIEW OF SYSTEMS:  Review of systems is a bit difficult to obtain.  She  obviously has severe problems with movement due to her cerebral palsy.  She  does have some movement of her upper extremities and is able to do some  activities.  She does have chronic shortness of breath.  She denies chest  pain or palpitations.  No lower extremity edema.  Also notable for altered  mental status and questionable blackouts.  She has not had fevers or chills.  No nausea or vomiting.  The remainder of review of systems is essentially  negative except as in HPI and problem list.   CURRENT MEDICATIONS:  Include Effexor 300 mg a day,  Seroquel 200 mg at  night, baclofen, Macrodantin, Zanaflex, Clonazepam, enoxaparin.   ALLERGIES:  No known drug allergies.   SOCIAL HISTORY:  She is estranged from her parents.  She has two children by  different fathers, one lives with her, one lives with her parents.  She  recently graduated from [employer].  She denies  tobacco or alcohol use.   FAMILY HISTORY:  There is no history of diabetes, coronary artery disease,  or hypertension.  She has one grandmother diagnosed with colon cancer.  There is no family history of known sudden cardiac death.   PHYSICAL EXAMINATION:  GENERAL:  She is a young woman lying in bed.  She has severe dysarthria and  severe spasticity and limited movement of her extremities.  Respiratory  effort is not strained.  VITAL SIGNS:  Blood pressure is 95/65, heart rate is 125.  She is afebrile.  HEENT:  Sclerae anicteric.  EOMI.  There is no xanthelasma.  Mucous  membranes are moist.  NECK:  Supple.  There is no JVD.  There does seem to be some thyroid  fullness but no palpable masses.  There is no lymphadenopathy.  CARDIAC:  She is tachycardiac and regular.  No murmurs or rubs or gallops.  LUNGS:  Clear.  ABDOMEN:  Soft, nontender, nondistended.  No hepatosplenomegaly, no bruits,  no masses appreciated.  Good bowel sounds.  EXTREMITIES:  Warm with no cyanosis, clubbing or edema.  Distal pulses are  strong.  There are no cords.  SKIN:  There are no rashes.  NEUROLOGICAL:  She is alert and oriented x3 with significant dysarthria, as  above. She has a very limited movement due to her cerebral palsy.   Review of telemetry shows sinus rhythm with frequent bursts of sinus tach up  to 150 beats per minute.  There is typically a gradual increase in heart  failure with no evidence of ectopy.  There is no 12-lead EKG.  White count  6.4, hemoglobin is 13.  Sodium is 138, potassium 3.8, chloride 104, bicarb  29, BUN 7, creatinine 0.6.    ASSESSMENT:  1. Intermittent sinus tachycardia.  2. Cervical palsy.   PLAN/DISCUSSION:  This is a very interesting case.  It is the second young  woman with cerebral palsy we have seen recently with apparent inappropriate  sinus tachycardia.  Dr. Graciela Husbands has been very involved with our first case and  I discussed this case with him.  Our initial patient has been challenging to  manage given her tachycardia and actually wound up having a diffuse  atriopathy with multiple foci of tachycardia in her right atrium.  She  underwent ablation which was modestly successful.  I suspect Ms. Weier  also has a component of autonomic dysfunction and possibly POTS syndrome  (postural orthostatic tachycardia syndrome) may be a consideration.   For now, we will start with the basics and rule out any reversible  underlying cause of her tachycardia.  Would check a 12-lead EKG, 2-D  echocardiogram, thyroid profile, magnesium, D-dimer.  I would also consider  a 24-hour urine to check for VMA and catecholamines which can be done as an  outpatient.  For heart rate control, we will attempt low dose beta blocker  with Toprol XL 50 mg a day.  We appreciate the consult and will follow.      Bevelyn Buckles. Bensimhon, MD  Electronically Signed     DRB/MEDQ  D:  09/11/2006  T:  09/12/2006  Job:  [id number]

## 2011-04-19 NOTE — Consult Note (Signed)
[name] [name], [name] [name]              ACCOUNT NO.:  [id number]   MEDICAL RECORD NO.:  [id number]          PATIENT TYPE:  INP   LOCATION:  5008                         FACILITY:  [facility]   PHYSICIAN:  Doralee Albino. Carola Frost, M.D. DATE OF BIRTH:  1976/09/03   DATE OF CONSULTATION:  06/10/2005  DATE OF DISCHARGE:                                   CONSULTATION   CONSULTING PHYSICIAN:  [facility].   REASON FOR CONSULTATION:  Possible left septic arthritic knee.   CHIEF COMPLAINT:  A 35 year old female with chief complaint of left knee  pain at this time.   HISTORY OF PRESENT ILLNESS:  This is a 35 year old who presented two weeks  ago with a pressure ulcer on her anterolateral aspect of her left knee  associated with erythema and swelling.  It was initially treated with p.o.  outpatient antibiotics and has subsequently failed that treatment with  Bactrim.  She was then admitted June 10, 2005, for possible left septic knee  arthritis.  We were then consulted to tap the left knee and to rule out any  septic joint effusion and arthritis.   PAST MEDICAL HISTORY:  1.  Degenerative joint disease.  2.  Endometriosis.  3.  Gastroesophageal reflux disease.  4.  Urinary tract infection's.  5.  Neurogenic bladder.  6.  Major depression, recurrent.  7.  Obsessive compulsive cerebral palsy.  8.  Amenorrhea.   MEDICATIONS:  1.  Baclofen 10 mg p.o. q.6h.  2.  Effexor XR 100 mg p.o. q.day.  3.  MiraLax 17 g p.o. daily.  4.  Remeron 22.5 mg p.o. q.day.  5.  Seroquel 300 mg p.o. q.h.s.  6.  Tylenol with codeine q.4-6h. p.r.n. pain.  7.  Xanax 0.5 mg p.o. t.i.d.  8.  Zanaflex 2 mg p.o. q.a.m., 4 mg q.noon.   ALLERGIES:  No known drug allergies at this time.   REVIEW OF SYSTEMS:  The patient denies any fevers, chills, nausea, vomiting,  diarrhea or night sweats at this time.  The patient denies any coughing.  Other review of systems are negative.   FAMILY HISTORY:  Unobtainable at this  time.   PHYSICAL EXAMINATION:  GENERAL APPEARANCE:  This is a wheelchair ridden 15-  year-old white female with history of cerebral palsy.  She is in no acute  distress at this time, in minimal discomfort in regards to her left knee.  Mental status:  She is alert and oriented x3.  HEENT:  Pupils are equal and reactive to light and accommodation.  Extraocular movements intact.  Atraumatic, normocephalic.  CHEST:  Clear to auscultation bilaterally.  HEART:  Regular rate and rhythm.  No murmur noted at this time.  ABDOMEN:  Positive bowel sounds, soft, nontender.  SKIN:  There is some erythema and edema in the left knee, left lower  extremity.  There is an obvious ulcer on the left anterior lateral distal  aspect of he knee.  EXTREMITIES:  Positive dorsal pedal pulses of bilateral lower extremities.  Positive radial pulses in bilateral upper extremities.   ASSESSMENT:  I believe that this is  a superficial cellulitis with an ulcer  that has failed outpatient p.o. antibiotics.   PLAN:  We will tap the knee and send off for stat Gram stain and Gram smear  and cultures.  Will recommend vancomycin 1 g q.12h. for antibiotic coverage.  Will defer appropriate dosing to [facility] and/or pharmacy.  Will continue to follow along with this patient to assure that this  superficial cellulitis and ulcer resolve.  We will check Gram stain and  cultures from the knee tap as soon as they are available to reassure the  patient and the medical personnel that this is not a septic arthritic knee.       RWC/MEDQ  D:  06/10/2005  T:  06/11/2005  Job:  [id number]

## 2011-04-19 NOTE — Discharge Summary (Signed)
NAMEFRANCISCA, [name] [name] NO.:  [id number]   MEDICAL RECORD NO.:  [id number]          PATIENT TYPE:  INP   LOCATION:  5009                         FACILITY:  [facility]   PHYSICIAN:  Pearlean Brownie, M.D.DATE OF BIRTH:  08-Jun-1976   DATE OF ADMISSION:  02/12/2005  DATE OF DISCHARGE:  02/20/2005                                 DISCHARGE SUMMARY   DISCHARGE DIAGNOSES:  1.  Right lower extremity abscess cellulitis.  2.  Spastic cerebral palsy, with quadriparesis.  3.  Anemia.  4.  Urinary retention.  5.  Obsessive-compulsive disorder.  6.  Depression.   This patient is a 35 year old female with history of cerebral palsy with  quadriparesis who was brought into the hospital with worrisome cellulitis  and abscess in her right lower extremity.  She had had erythema for about  five days with draining for about two prior to admission.  This was painful  to touch.  The patient does not walk at home.  She crawls, and that is her  primary mode of transportation.   VITAL SIGNS AT ADMISSION:  TEMPERATURE:  102.  HEART RATE:  126.   IMPORTANT PHYSICAL EXAM FINDINGS:  EXTREMITIES:  A 12 x 7 cm area of  induration, erythema, and warmth, without fluctuance, with a 3 cm central  area of necrosis, with purulent drainage on right lower extremity anterior.   LABORATORIES AT ADMISSION:  White blood cell count 18.7, hemoglobin and  hematocrit 12.2 and 34.8, absolute neutrophil count 14.9, with 80%  neutrophils.  Sodium 130.   So, the patient was admitted for antibiotics and was started on doxycycline  p.o. and ceftriaxone IM.  She would have been started on IV Ancef, except  with her spasticity it was too difficult to place an IV, so p.o. antibiotics  were started.  Otherwise, home medications were continued.   PROCEDURES:  On February 13, 2005, a right tibia-fibula x-ray showed:  1)  findings compatible with abscess anterior to the proximal tibia, without  underlying osteomyelitis;  2)  edema versus cellulitis.  Chest x-ray on February 13, 2005 showed PICC line with tip in the right atrium, and no acute  disease.  Chest x-ray on February 19, 2005 showed no active disease.   CONSULTS:  None.   HOSPITAL COURSE:  1.  Right lower extremity abscess/cellulitis.  The patient was admitted and      placed on doxycycline p.o. and Rocephin IM.  However, the erythema      continued to spread, and the patient had culture positive for MRSA, so      she was switched to vancomycin IV.  Sensitivities came back, and the      bacteria were sensitive to Bactrim, so after two days of vancomycin the      patient was switched to Bactrim.  She currently has had 7 out of 14 days      of Bactrim.  She is also getting b.i.d. dressing changes.  The erythema,      edema, calor have all decreased significantly to the point that now the  patient simply has a wound.  She will go home on seven days more of p.o.      Bactrim, with followup with Dr. Dahlia Byes.  She has been afebrile since      shortly after admission, and white blood cell count remains normal at      7.7 on the day of discharge.  2.  Cerebral palsy.  Continued the patient's home medications, the Baclofen      and Zanaflex.  This is stable.  3.  Urinary retention.  On February 15, 2005, the patient developed decreased      urinary output.  She was taking Tylenol No. 3 p.r.n. pain, which is her      normal pain regimen at home.  However, the patient was having increased      pain due to her right lower extremity abscess and cellulitis, so was      taking more medication than at home.  She does have a remote history of      neurogenic bladder, however not recently.  We placed a Foley and gave      the patient bethanechol on February 15, 2005.  On February 16, 2005, we weaned      the Foley.  The patient was able to urinate on her own, and on February 17, 2005 we discontinued the bethanechol.  The patient continued to urinate      without  problems.  4.  Psychiatric.  The patient has a history of OCD and depression.  She      seems to be stable on her home medications and will go home on her home      medications.  5.  Anemia.  The patient's hemoglobin today is 11.8.  During her hospital      course, her hemoglobin dropped from 12.2 to 9.8.  This was likely due to      infection.  The patient was hemoccult negative and had no areas of      bruising or likely bleeding.  In addition, the anemia has been resolving      as the infection has cleared.  Currently at discharge, the hemoglobin is      11.8.  However, this should be followed up outpatient to ensure that it      continues to normalize.  6.  Thrombocytosis.  The patient's platelet count today is 578.  It has been      elevated during her admission since February 16, 2005, when it was 344.      Again, this is likely a response to infection.  However, it should be      followed up outpatient.   DISCHARGE MEDICATIONS:  1.  Xanax 0.5 mg p.o. t.i.d.  2.  Baclofen 10 mg p.o. q.6 h.  3.  Remeron 22.5 mg p.o. at bedtime.  4.  Protonix 40 mg p.o. q.24 h.  5.  MiraLax 17 g p.o. q.24 h.  6.  Seroquel 200 mg p.o. q.24 h.  7.  Zanaflex 4 mg p.o. q.12:00 p.m. and at bedtime.  8.  Zanaflex 2 mg p.o. q.a.m. and q.p.m.  9.  Bactrim 2 tablets p.o. b.i.d. x 7 days.  10. Effexor 150 mg p.o. q.24 h.  11. Tylenol 650 mg p.o. q.4 h. p.r.n. pain.  12. Imitrex 50 mg 1-2 tablets p.o. q.1 h. p.r.n. headache x 2.   DISPOSITION:  The patient will be going to temporary assisted living.  The  exact placement is dependent on social work.   FOLLOWUP:  The patient will follow up in one to two weeks with Dr.  Dahlia Byes.      MJ/MEDQ  D:  02/20/2005  T:  02/20/2005  Job:  [id number]   cc:   Deanna Artis. Sharene Skeans, M.D.  [address]  [address]  [place]  [place] [address]  Fax: [phone]   Georgina Peer, M.D.  [address] [place], [place] [address]  Fax: [phone]

## 2011-04-19 NOTE — Discharge Summary (Signed)
NAMEOLIVIANNA, [name] [name]              ACCOUNT NO.:  [id number]   MEDICAL RECORD NO.:  [id number]          PATIENT TYPE:  IPS   LOCATION:  4037                         FACILITY:  [facility]   PHYSICIAN:  Broadus John T. Pickard II, MDDATE OF BIRTH:  [birth year]   DATE OF ADMISSION:  10/10/2004  DATE OF DISCHARGE:                                 DISCHARGE SUMMARY   ADDENDUM:  Please see the previous stat discharge summary on the chart.  There are no further changes in the plan of care.  The patient is to be  discharged to [facility] for acute rehab, including physical  therapy as well as nutritional monitoring and weight gain.       WTP/MEDQ  D:  10/10/2004  T:  10/10/2004  Job:  [id number]

## 2011-04-19 NOTE — Discharge Summary (Signed)
NAMEARANDA, BIHM              ACCOUNT NO.:  [id number]   MEDICAL RECORD NO.:  [id number]          PATIENT TYPE:  INP   LOCATION:  5734                         FACILITY:  [facility]   PHYSICIAN:  Broadus John T. Pickard II, MDDATE OF BIRTH:  [birth year]   DATE OF ADMISSION:  10/02/2004  DATE OF DISCHARGE:  10/09/2004                                 DISCHARGE SUMMARY   CONSULTING PHYSICIAN:  Dr. Billie Ruddy and also Dr. Jeanie Sewer with  psychiatry.   DISCHARGE DIAGNOSES:  1.  Failure to thrive secondary to declining mental status and major      depressive disorder.  2.  Major depressive disorder with anorexia.  3.  Mental retardation secondary to cerebral palsy.   PROCEDURE:  None.   LABORATORY DATA:  Admission CBC:  White count 6.6,  hemoglobin 13.5,  hematocrit 40, platelet count 329, sodium 135, potassium 4, chloride 102,  bicarb 27, glucose 115, BUN 10, creatinine 0.7, total bili 0.5, alk phos 66,  AST 17, ALT 15, total protein 7.5, albumin 4.5, calcium 9.2, TSH 0.381.   HISTORY AND PHYSICAL:  The patient is a 35 year old white female with MRCP  and major depressive disorder who presents with a 30-pound weight loss over  the preceding 9 months.  This has been attempted and failed to be managed as  an outpatient.  Weight loss is thought to be secondary to her declining  mental status.  She was admitted for inpatient monitoring, nutrition  consult, OT and PT consult, some basic labs to rule out organic causes of  weight loss, and to arrange for placement.   HOSPITAL COURSE:  1.  Weight loss.  The patient was admitted.  Intake labs showed no evidence      of an infection or organic cause of weight loss.  TSH was on the low      limit of normal, but was still within normal limits and would not      explain 30-pound weight loss over the preceding months.  However, would      appreciate if her accepting physician would monitor this in the future.      The patient was admitted and  nutrition consult was obtained and      recommended daily Ensure supplementation with one can, and also increase      supervision with meals.  While the patient was in the hospital, she was      consuming greater than 60% of her meals.  The majority of the time      consuming 100% of her meals.  She met her calory count goals.  A weight      in our clinic prior to admission had been 98.9 pounds.  A weight here in      the hospital had shown to be a 109.2 pounds.  The patient was  eating      without difficulty, was gaining weight appropriately, and was meeting      calory count goals by the day of discharge.  Also, while in patient her      neurologist Dr. Sharene Skeans and psychiatrist  Dr. Jeanie Sewer were consulted      to titrate her medicines as appropriate to try to increase her appetite.      Please see the following note on problem #2.  2.  Depression.  The patient's Zoloft was tapered off and Effexor was      initiated.  This was titrated up to a goal Effexor dose on the day of      discharge of 150 mg p.o. daily.  She was also initiated on Remeron and      this was titrated up to a goal dose of 22.5 mg p.o. q.h.s. to try to      increase appetite.  The patient's blood pressure was monitored and was      stable during this titration and her Zoloft was discontinued.  3.  MRCP.  This was managed with her chronic outpatient medicines.   DISCHARGE MEDICATIONS:  1.  Xanax 0.5 mg p.o. t.i.d.  2.  Baclofen 10 mg p.o. q.i.d.  She also has a Baclofen pump.  3.  Ensure one can p.o. daily.  4.  Lamictal 100 mg p.o. q.h.s.  5.  Remeron 22.5 mg p.o. daily.  6.  Protonix 40 mg p.o. daily.  7.  MiraLax 17 g p.o. b.i.d.  8.  Seroquel 200 mg p.o. q.h.s.  9.  Effexor 150 mg p.o. daily.  10. Baclofen pump.  It was last recharged September 21, 2004 and followed by      Dr. Sharene Skeans.  11. Zanaflex 2 mg p.o. q.a.m., 4 mg p.o. at noon, 2 mg p.o. q.p.m. and 4 mg      p.o. q.h.s.   FOLLOWUP INSTRUCTIONS:   The patient was instructed to follow up as  previously scheduled with Dr. Sharene Skeans of neurology.  She is also seen by  Dr. Gwyndolyn Kaufman at [facility] and her case worker is Hardie Lora, cell phone [phone] and office number is [phone].  Dr. Joanell Rising  office number is [phone].       WTP/MEDQ  D:  10/08/2004  T:  10/08/2004  Job:  [id number]   cc:   Deanna Artis. Sharene Skeans, M.D.  [address]  [address]  [place]  [place] [address]  Fax: [phone]   Dr. Cicero Duck, M.D.   Extended care facilty that is accepting

## 2011-04-19 NOTE — Consult Note (Signed)
[name] [name], [name] [name]              ACCOUNT NO.:  [id number]   MEDICAL RECORD NO.:  [id number]          PATIENT TYPE:  INP   LOCATION:  5735                         FACILITY:  [facility]   PHYSICIAN:  Deanna Artis. Hickling, M.D.DATE OF BIRTH:  18-Oct-1976   DATE OF CONSULTATION:  10/03/2004  DATE OF DISCHARGE:                                   CONSULTATION   CHIEF COMPLAINT:  Depression, weight loss, and poor self-care.   I was asked by Dr. Deirdre Priest to see the patient, a woman I have known for  over 10 years.  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  The patient has had  bipolar affective disorder and depression.   The patient has had a longstanding history of difficulty with spasticity,  although, when I first knew her, she was able to do more for herself in  terms of self-help skills and ambulation.   In an attempt to try to make her more flexible, which we hoped would improve  her ability to care for herself, an intrathecal Baclofen pump was placed  about three years ago.  Unfortunately, though the patient's spasticity was  decreased with Baclofen, as is true in many patients with mixed spastic and  athetoid quadriparesis, the Baclofen was unable to address all of the needs  that a person has with regards to spasticity.   Over time, we have added back oral Zanaflex and Baclofen and pushed them  toward the maximum amount that I think she would tolerate without becoming  sleepy.  The patient has had significant problems with decline in performing  activities of daily living and independent activities of daily living.  She  has not been eating.  She had gained 30 pounds which she has now lost and  this has created a great deal of concern among her caregivers, both Dr. Mila Homer  and Dr. Dahlia Byes.   Concerns have been raised about whether or not she is able to care for  herself and a group home has been suggested.  I  think that a group home  should be looked at as a means of last resort to be used when and if the  patient shows herself to be totally incompetent to care for herself with her  boyfriend, Frank's help. He said that he will do anything to try to help  her.  There are times that he is around and she is by herself with her 37-year-old baby.  She receives about 20 hours a week of personal care services  which cannot be increased, but would allow Homero Fellers sometime to be away to  either work or to take care of other affairs.   In addition to having her daughter stay with Frank's sister, her older son,  Orvilla Fus, is now living with his father.  Her parents have not been supportive  of the patient becoming independent.  They were unhappy with the patient's  decision to become a mother, not once but twice, and were particularly  unhappy with the mixed race child, [name], who  is her 66-year-old daughter.  They have refused to take her back home and have not been supportive at all  of any attempts that she has had for independence.   In this very difficult social setting, the patient has spiraled downward,  and her recent medical problems include weight loss, amenorrhea, urinary  tract infection, and depression.   PAST MEDICAL HISTORY:  Remarkable also for obsessive compulsive behaviors,  constipation, gastroesophageal reflux disease, gestational diabetes,  abnormal Pap smear (low grade SIL) which was normal in August of 2005,  endometriosis, and neurogenic bladder.   PAST SURGICAL HISTORY:  Cesarean section in 1997, bilateral tubal ligation,  Baclofen pump in August of 2000.  Laparoscopic endometriosis excision in  August of 2002.   CURRENT MEDICATIONS:  1.  Baclofen pump last evaluated on September 21, 2004.  Baclofen 10 mg p.o.      q.i.d. (there is some controversy about this).  2.  Lamictal 100 mg p.o. daily.  3.  MiraLax one daily.  4.  Prevacid 30 mg daily.  5.  Seroquel 200 mg daily and  h.s.  6.  Xanax XR 2 mg p.o. q.a.m.  7.  Zoloft 200 mg daily.  8.  Zanaflex 2 mg one in the morning, two at noon, and two at nighttime.  9.  Tylenol with Codeine or Darvocet as needed for pain.   ALLERGIES:  No known drug allergies.   REVIEW OF SYSTEMS:  Mild nausea, no abdominal pain, diarrhea, vomiting.  No  hematemesis or melena.  No other obvious sign of an organic cause for her  weight loss.   SOCIAL HISTORY:  Described above.   FAMILY HISTORY:  Noncontributory.   PHYSICAL EXAMINATION:  VITAL SIGNS:  Temperature 97.4, blood pressure 93/61,  resting pulse 77, respirations 16, O2 saturation 96%.  HEENT:  No infections.  LUNGS:  Clear.  HEART:  No murmurs.  Pulses normal.  ABDOMEN:  Scaphoid with Baclofen pump in the right lower quadrant.  EXTREMITIES:  Flexion of her arms, extension of her legs, tonic left  extensor of the large toe.  Most of her contractures are dynamic and can be  reduced.  NEUROLOGY:  Awake, alert, subdued, dysarthric.  Cranial nerves; round,  reactive pupils, visual fields full.  Extraocular movements full.  Midline  tongue, but she has decreased movements and mild lower facial weakness.  Dysarthria which is intelligible.  Motor examination; 5/5 biceps, 4+  triceps, 4 deltoid, 4 grip. Very clumsy fine motor movements, 2/5 legs.  Sensory; withdrawal x4, no level. Cerebellar examination; gait could not be  tested.  Reflexes are absent (surprisingly).  This is largely related to  cocontractures.  Right flexor, left extensor plantar response.   IMPRESSION:  1.  Spastic athetoid quadriparesis.  343.2, 343.7 with sparing of the left      upper extremity.  2.  Dysarthria with mild dysphagia.  784.5, 787.5  3.  Organic gait disorder.  781.2  Secondary to #1.  4.  Headache disorder.  784.0  5.  Depression.  6.  Weight loss which I think is a secondary rather than a primary problem.  My impressions are that this woman does not need to have a large weight   gain.  It is difficult enough for her to get around.  Neither does she need  any further weight loss.  This needs to be carefully considered as Megace 80  mg q.i.d. is started and considerations are being made to switch Remeron for  Zoloft.  The patient needs to decide for herself to eat as a way to  demonstrate her competency and that she, with her boyfriend Chalmers Guest help, can  make it at home.  She is very depressed over losing her son, Orvilla Fus, and now  her daughter to relatives on both sides.  She is depressed at the lack of  support that she has had from her family.  She also has an endogenous  depression, but much of this is exogenous.   Her spasticity is stable at this time.  She is taking two oral  antispasticity agents as well as an intrathecal and there is only so much  that can be done to deal with a cocontraction of her athetosis.  Physical  therapy will help and we have tried to prescribe it in the past.  There is a  great deal of difficulty with transportation and I am not certain that she  is looked at as being truly homebound.  I do not recommend changes right now  on oral or intrathecal Baclofen or in Zanaflex.  I will increase the  Baclofen up to 10 mg four times a day because the patient tells me that is  how much she has been taking.  If the psychiatric medicines need to be  changed, I recommend consultation with Dr. Jeanie Sewer, but Dr. Mila Homer has  followed her for a long time.  I believe that much of her difficulties right  now are exogenous.  I do not think that Group Home is the best location for  this woman, but it may prove to be necessary if she proves incapable of  taking care of herself.  It will plunge her further into depression.  I  appreciate the opportunity to participate in her care.  If you have any  questions about this or I can be of assistance, do not hesitate to contact  me.       WHH/MEDQ  D:  10/03/2004  T:  10/03/2004  Job:  [id number]   cc:   Pearlean Brownie, M.D.  Fax: [phone]   Georgina Peer, M.D.  [address] [place], [place] [address]  Fax: [phone]   Ezzard Flax  [address] [place]  [place] [address]  Fax: [phone]

## 2011-04-19 NOTE — Discharge Summary (Signed)
NAMEHAN, LYSNE NO.:  [id number]   MEDICAL RECORD NO.:  [id number]          PATIENT TYPE:  INP   LOCATION:  3016                         FACILITY:  [facility]   PHYSICIAN:  Bevelyn Buckles. Champey, M.D.DATE OF BIRTH:  Oct 11, 1976   DATE OF ADMISSION:  09/08/2006  DATE OF DISCHARGE:  09/12/2006                                 DISCHARGE SUMMARY   REASON FOR VISIT:  Blackout spells.  In for video EEG monitoring.   PRIMARY DIAGNOSIS:  Sinus tachycardia episodes.   SECONDARY DIAGNOSES:  1. Cerebral palsy.  2. Spastic quadriparesis.  3. Dystonia.  4. Severe dysarthria.  5. Cesarean section.   HOSPITAL COURSE:  This is a 35 year old Asian female with spastic  quadriparesis who has been followed by Dr. Sharene Skeans for dystonia,  quadriparesis, and severe dysarthria secondary to those above.  The patient  was having episodes of confusion and blackout spells and was admitted for  video EEG monitoring to further delineate these spells.  The patient was  placed on video EEG monitoring on September 08, 2006 and video EEG monitoring  was reviewed daily.  Please see full dictated note from the video EEG  monitoring.  Patient had a few small spells of unusual sensations.  However,  no full-blown blackout events during this tracing.  Patient showed no  evidence of epileptiform activity at discharge or throughout this  hospitalization.  She was found to have numerous events of bursts of sinus  tachycardia or intermittent tachycardia that could be contributing to her  events.  Cardiology was consulted and recommended placing the patient on a  beta-blocker and also recommended doing 2-D echocardiogram, EKG, and  laboratory work.  Patient's EKG shows sinus tachycardia, 2-D echocardiogram  showed an EF of 60%.  Patient was stable, had no further events, and was  cleared by cardiology for discharge on September 12, 2006.  It was recommended  to consider doing a 24-hour urine for VMA and  catecholamines, which can be  done as an outpatient.  She was started on a beta-blocker, Toprol XL 50 mg  per day.  Her heart rate, upon discharge, was in the 70s and 80s.  Patient  is discharged home in stable condition on her previous medications and new  medication of Toprol XL.   DISCHARGE MEDICATIONS:  1. Toprol XL 50 mg 1 tablet p.o. daily.  2. Effexor XR 300 mg q.h.s.  3. Seroquel 200 mg q.h.s.  4. Baclofen 10 mg q.i.d.  5. Mepergan.  6. Zanaflex.  7. Clonazepam.   Patient was told to follow up with [facility] on September 29, 2006,  at 11:00 a.m. and with Dr. Sharene Skeans within the next month.  The patient was  told to continue her home care services as well.  We will discharge the  patient in stable condition.  She is told to call the office for any  questions or concerns.  New prescriptions include Toprol XL.   CC: Dr Crista Curb R. Nash Shearer, M.D.  Electronically Signed     DRC/MEDQ  D:  09/12/2006  T:  09/13/2006  Job:  [id number]

## 2011-04-19 NOTE — Discharge Summary (Signed)
[name], [name] NO.:  [id number]   MEDICAL RECORD NO.:  [id number]          PATIENT TYPE:  IPS   LOCATION:  4037                         FACILITY:  [facility]   PHYSICIAN:  Ellwood Dense, M.D.   DATE OF BIRTH:  [birth year]   DATE OF ADMISSION:  10/10/2004  DATE OF DISCHARGE:  10/26/2004                                 DISCHARGE SUMMARY   ADDENDUM:  Patient initially scheduled for discharge on October 24, 2004, although  with placement of PPD tuberculosis testing needed to stay for the next 48  hours, which extended her discharge to October 26, 2004.  PPD was negative.  She was discharged to group home in stable condition.  The remainder of her  hospital course remained uneventful.  Please see full details of discharge  summary for discharge medications       DA/MEDQ  D:  10/26/2004  T:  10/26/2004  Job:  [id number]

## 2011-04-19 NOTE — Discharge Summary (Signed)
NAMEDOCIE, ABRAMOVICH NO.:  [id number]   MEDICAL RECORD NO.:  [id number]          PATIENT TYPE:  IPS   LOCATION:  0500                          FACILITY:  [facility]   PHYSICIAN:  Anselm Jungling, MD  DATE OF BIRTH:  01-May-1976   DATE OF ADMISSION:  11/12/2006  DATE OF DISCHARGE:  11/21/2006                               DISCHARGE SUMMARY   IDENTIFYING DATA AND REASON FOR ADMISSION:  This was an inpatient  psychiatric admission for [name] [name], a 35 year old Caucasian female and  single mother, with severe cerebral palsy.  She was admitted with  increasing depression and suicidal ideation.  Her 42-year-old daughter  had recently been taken from her by the Department of Social Services.  She came to [place] as a patient of Dr. Mila [name] at [facility].  Please refer to the admission note for further details  pertaining to the symptoms, circumstances and history that led to  hospitalization.  She was given an initial Axis I diagnosis of  depressive disorder NOS, history of obsessive-compulsive disorder,  adjustment disorder NOS, and cerebral palsy.   MEDICAL AND LABORATORY:  The patient was medically and physically  assessed by the psychiatric nurse practitioner.  She was wheelchair  bound, and had a motorized wheelchair that she functioned quite well in.  She was given her usual Zanaflex 2 mg in the morning and 4 mg at  bedtime, baclofen 10 mg q.i.d., and Toprol XL 100 mg daily for  hypertension.  There were no acute medical issues.   HOSPITAL COURSE:  The patient was admitted to the adult inpatient  psychiatric service.  She presented as a wheelchair bound female with  typical spasticity and choreoathetoid movements with severely dysarthric  speech.  However, she appeared to be of above-average intelligence and  communicated quite well.  She showed surprisingly good level of social  skills, and got along with peers and staff alike.  She is generally a  very  likable individual.  There were no signs or symptoms of psychosis  or thought disorder.  Her mood was depressed with sad affect.  She  denied any suicidal ideation, and verbalized a strong desire for help  here.   She was treated with a psychotropic regimen involving Seroquel 200 mg at  bedtime, Effexor XR 300 mg daily, and Klonopin 0.5 mg q.a.m. and 1 mg  q.h.s..  She was a good participant in treatment program and attended  various therapeutic groups and activities in good fashion.  Nonetheless  her depression continued, and she was sad about having her daughter  taken from her.  She did good job of focusing on the pertinent issues.  There were contributing to her depression.   There was a family session involving the patient's parents, they pledged  their support to her, and indicated that they would be open to her  living with them on a temporary basis.   By the 10th hospital day, the patient appeared to be appropriate for  discharge, given that she appeared to be having reasonable response to  medication and was tolerating them well,  and she agreed to the following  aftercare plan.   AFTERCARE:  The patient was to follow up at counseling center of  [name] with an appointment on 11/27/2006.  She was to follow up with  Dr. Mila [name] at the [name] on 12/04/2006.  She was to contact [name] and given [name] [name] [name]'s phone number.  She was to follow  up with [name] [name], neurologist, on the date of discharge, December  21, in the afternoon.  She was instructed to get a PPD at her doctor's  office, in anticipation of perhaps later being involved in a group care  setting.   DISCHARGE MEDICATIONS:  Seroquel 200 mg q.h.s., Effexor XR 300 mg daily,  Klonopin 0.5 mg q.a.m. and 1 mg q.h.s., Zanaflex 2 mg q.a.m. and 4 mg  q.h.s., Toprol XL 100 mg daily, baclofen 10 mg q.i.d..   DISCHARGE DIAGNOSES:  AXIS I: Depressive disorder not otherwise  specified, adjustment  disorder with depressed mood, and cerebral palsy.  AXIS II: Deferred.  AXIS III: Motor limitation secondary to cerebral palsy.  AXIS IV: Stressors severe.  AXIS V: Global Assessment Function on discharge 70.      Anselm Jungling, MD  Electronically Signed     SPB/MEDQ  D:  12/11/2006  T:  12/13/2006  Job:  [id number]

## 2011-04-19 NOTE — H&P (Signed)
[name] [name], [name] [name]              ACCOUNT NO.:  [id number]   MEDICAL RECORD NO.:  [id number]          PATIENT TYPE:  INP   LOCATION:  5008                         FACILITY:  [facility]   PHYSICIAN:  Pearlean Brownie, M.D.DATE OF BIRTH:  03-30-1976   DATE OF ADMISSION:  06/10/2005  DATE OF DISCHARGE:                                HISTORY & PHYSICAL   HISTORY OF PRESENT ILLNESS:  This is a 35 year old female with cerebral  palsy who presents with a two-week history of pressure ulcer at the lateral  aspect of the left leg at the fibular area associated with edema and  swelling, which progressed and involves the lower thigh and upper half of  the left leg.  Purulent discharge was noted from the pressure ulcer about a  week ago.  Patient consulted at urgent care and was given Bactrim DS 2  tablets t.i.d.  The patient was seen at the family practice center four days  ago and advised to continue medication; however, because of increased  swelling around the left knee with pain on flexion and extension of the left  knee, the patient was seen at the clinic today.   PAST MEDICAL HISTORY:  1.  Depression.  2.  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  3.  Constipation.  4.  History of MRSA abscess of the right shin requiring I&D in March, 2006.   PAST SURGICAL HISTORY:  1.  Cesarean section in 1997 and in 2003.  2.  Baclofen pump insertion in 2000.  3.  Laparoscopic excision of endometriosis.  4.  Incision and drainage of the right lower extremity abscess.   MEDICATIONS:  1.  Baclofen 10 mg p.o. every 6 hours and pump.  2.  Effexor XR 150 mg p.o. daily.  3.  MiraLax 17 gm p.o. daily.  4.  Remeron 22.5 mg p.o. daily.  5.  Seroquel 300 mg p.o. q.h.s.  6.  Tylenol w/Codeine every 4-6 hours p.r.n.  7.  Xanax 0.5 mg p.o. b.i.d.  8.  Zanaflex 2 mg in the morning, 4 mg at noon, and 2 mg q.p.m.   ALLERGIES:  No known drug allergies.   FAMILY HISTORY:  No history of diabetes, CAD, hypertension.  There is a  history of colonic cancer in the grandmother.   PERSONAL/SOCIAL HISTORY:  Patient previously lived in a group home.  In  November, 2005, the patient was also in a group home and lived with her  common law husband, Ailene Ards, who has schizophrenia and MR.  Patient  needs assistance with ADLs and IADLs and has personal care services about 20  hours a week.  She has two children.  Her son, born in 20, now lives with  patient's father, and her daughter now lives with her sister-in-law.  The  patient is wheelchair-bound and uses an Mining engineer wheelchair.  She does not  smoke or drink alcohol.   REVIEW OF SYSTEMS:  No history of fevers or chills, dysuria, frequency,  cough.  The patient has occasional nausea but no vomiting, diarrhea.  PHYSICAL EXAMINATION:  VITAL SIGNS:  Blood pressure 131/94, heart rate 108,  R 20, temperature 97.8.  GENERAL:  This is a disheveled white female sitting in her wheelchair,  accompanied by a female friend.  Not in distress.  She is alert,  communicative, with some dysarthria.  HEENT:  Pink conjunctivae.  Anicteric sclerae.  Tympanic membranes show  normal landmarks.  No tonsillar pharyngeal congestion.  NECK:  No cervical lymphadenopathy.  No carotid bruits.  No thyromegaly.  RESPIRATORY:  Clear to auscultation bilaterally.  No crackles or wheezes.  CARDIOVASCULAR:  89 at precordium.  Regular rate and rhythm.  Normal S1 and  S2.  No murmurs.  ABDOMEN:  Normal bowel sounds.  Soft.  No tenderness.  EXTREMITIES:  There is no paucity in all four extremities.  There is note of  erythema and swelling at the left knee, lateral aspect, extending to the  lower one-third of the left thigh and middle aspect of the left lower leg.  There is an ulcer of the tibial area of the left leg approximately 2 cm with  raw edges draining yellowish, straw-colored fluid.  There is no note of any  pus or  necrosis at the ulcer site.  There is slight tenderness to palpation  around the knee joint on the left.  No definite fluctuant area appreciated.  There is imitation of motion on knee flexion and extension on the left.  Likewise, the left knee area is noted to be warm to touch.   ASSESSMENT/PLAN:  A 35 year old white female with cerebral palsy admitted  for left lower extremity cellulitis, not responding to oral antibiotic  therapy.  Problem 1:  Cellulitis of the left lower extremity with pressure ulcer:  The  patient failed one week of oral antibiotic treatment in the form of Bactrim  with noted progression of the swelling involving the left knee, lower one-  third of the thigh, lateral aspect, and the upper half of the left lower  leg.  Although there is no definite fluctuant area on palpation.  The  possibility of an underlying abscess is a concern.  Furthermore, there is  concern for involvement of the left knee joint.  The patient has a history  of MRSA abscess on the right shin, left margin, which required incision and  drainage.  This presentation is typical of MRSA cellulitis.  We will do  blood cultures, wound cultures.  We will start vancomycin.  We will consult  orthopedics to assess possible joint involvement.  The patient may need knee  aspiration to rule out the possibility of septic arthritis.  Will give  Tylenol for pain.  Problem 2:  Depression:  Continue Effexor XR 150 mg p.o. daily.  Problem 3:  Cerebral palsy with spastic quadriparesis:  Continue Baclofen at  home dose.  Problem 4:  Constipation:  The patient maintained on MiraLax 17 gm p.o.  daily.  Problem 5:  Rehab:  Patient will need PT/OT consultation prior to discharge  to determine any PT needs.  Problem 6:  __________ and IV at [facility].  Regular diet.  Problem 7:  Social:  Discussed with the patient and boyfriend the need for  admission for failed outpatient therapy:  The patient initially hesitant because she  does not want to be sent back to a group home.  Addressed their  questions and concerns.  After the __________, the patient agreed to  admission for further treatment.       MC/MEDQ  D:  06/10/2005  T:  06/10/2005  Job:  [id number]   cc:   Altamese Cabal, M.D.  Fax: [phone]

## 2011-04-19 NOTE — Op Note (Signed)
   NAME:  [name] [name], [name] [name]                        ACCOUNT NO.:  [id number]   MEDICAL RECORD NO.:  [id number]                   PATIENT TYPE:  INP   LOCATION:  9181                                 FACILITY:  [facility]   PHYSICIAN:  Conni Elliot, M.D.             DATE OF BIRTH:  Jun 23, 1976   DATE OF PROCEDURE:  DATE OF DISCHARGE:  09/24/2002                                 OPERATIVE REPORT   PREOPERATIVE DIAGNOSES:  1. Intrauterine pregnancy at term with prior cesarean delivery with request     for repeat.  2. Desire for surgical sterilization.   POSTOPERATIVE DIAGNOSES:  1. Intrauterine pregnancy at term with prior cesarean delivery with request     for repeat.  2. Desire for surgical sterilization.   OPERATION:  1. Low transverse cesarean.  2. Modified Pomeroy bilateral tubal ligation.   OPERATOR:  Conni Elliot, M.D.   ANESTHESIA:  Continuous lumbar epidural.   OPERATIVE FINDINGS:  Female infant with Apgars of 7 and 8 at one and five  minutes, respectively.  Cord pH was sent.  Placenta was sent.   OPERATIVE PROCEDURE:  After placement of continuous lumbar epidural  anesthetic, patient supine in left lateral tilt position receiving oxygen,  ________________ Betadine solution.  Entered through a low transverse  Pfannenstiel incision which was made in the skin and subcutaneous fascia.  Rectus muscles separated in midline.  Peritoneal cavity entered.  Bladder  flap created and a low transverse uterine incision was made.  Baby was  delivered from vertex presentation.  Cord doubly clamped and cut and baby  handed to neonatologist in attendance.  Placenta was delivered  spontaneously.  Uterus and bladder flap closed in routine fashion.  The  right fallopian tube was identified, grasped with Babcock clamp, followed  out to fimbriated end.  Segment of tube was brought to the operative field,  double suture ligated and approximately 1.5-2 cm segment excised.  Hemostasis adequate.   __________ done on the left side and again hemostasis  adequate.  Anterior peritoneum, fascia closed in routine fashion.  Estimated  blood loss less than 800 cc.  Instrument count correct.                                               Conni Elliot, M.D.    ASG/MEDQ  D:  10/21/2002  T:  10/21/2002  Job:  [id number]

## 2011-04-19 NOTE — Discharge Summary (Signed)
   NAME:  [name] [name], [name] [name]                        ACCOUNT NO.:  [id number]   MEDICAL RECORD NO.:  [id number]                   PATIENT TYPE:  INP   LOCATION:  9181                                 FACILITY:  [facility]   PHYSICIAN:  Nani Gasser, M.D.            DATE OF BIRTH:  [birth year]   DATE OF ADMISSION:  09/21/2002  DATE OF DISCHARGE:  09/24/2002                                 DISCHARGE SUMMARY   DISCHARGE DIAGNOSES:  Status post low transverse cesarean section, repeat  secondary to previous cesarean section and cerebral palsy.   DISCHARGE MEDICATIONS:  1. Ibuprofen 600 mg 1 p.o. q.6h. p.r.n. pain.  2. Darvocet-N 100 1-2 tablets p.o. q.4h. as needed for severe pain.  3. Prenatal vitamins 1 p.o. q.d. x6 weeks.  4. Iron 375 mg t.i.d. with meals x6 weeks  5. Stool softener.  6. The patient is also to continue all of her other home medications.   DISCHARGE INSTRUCTIONS:  She is to do no heavy lifting for 6 weeks.  Nothing  per vagina for 6 weeks.   FOLLOW UP:  Return to [facility] in 6 weeks for a followup Gyn  appointment.   HOSPITAL COURSE:  The patient is a 35 year old G-2, now P-2-0-0-2, who  presented at term for a scheduled repeat c-section secondary to a prior c-  section and cerebral palsy.  She also had a bilateral tubal ligation.  The  patient did well during her admission.  There were initially some concerns  with her anesthesia but these were resolved and the patient's pain was well  controlled at discharge on p.o. medications.  The patient was also known to  be a gestational diabetic that was diet controlled.  Her fasting blood  sugars postoperatively were normal and the patient was allowed to eat a  regular diet at discharge.                                               Nani Gasser, M.D.    CM/MEDQ  D:  09/24/2002  T:  09/25/2002  Job:  [id number]

## 2011-04-19 NOTE — Assessment & Plan Note (Signed)
[facility]                            CARDIOLOGY OFFICE NOTE   [name], [name]                     MRN:          [id number]  DATE:12/29/2006                            DOB:          1976-02-04    CARDIOLOGIST:  Dr. Arvilla Meres.   NEUROLOGIST:  Dr. Ellison Carwin.   PRIMARY CARE PHYSICIAN:  Dr. Pearlean Brownie.   HISTORY OF PRESENT ILLNESS:  [name] [name] is a 35 year old female patient  with a history of cerebral palsy as well as sinus tachycardia who  presents to the office today for followup on blackout spells.  She has  had this before and has been evaluated extensively.  Apparently she has  had an overnight stay at the hospital to evaluate her for seizures.  The  testing has been negative for seizures in the past.  Dr. [name] has  seen her because she had noted tachycardia while she was seen in the  hospital to rule out seizures.  She did have Holter monitor that showed  occasional sinus tachycardia and atrial tachycardia.  She is maintained  on Toprol.  Over the last 3 weeks she has noted a couple of episodes of  blackout spells.  They sound more like absence seizures than anything.  She denies any palpitations.  She denies any syncope.  These episodes  are witnessed and people usually tell her that she is awake.  She does  not remember these episodes.  She denies chest pain, shortness of  breath.  She denies fevers, chills, cough.  She denies any pleuritic  chest pain.   MEDICATIONS:  1. Effexor 300 mg nightly.  2. Seroquel 10 mg nightly.  3. Baclofen 10 mg four times a day.  4. Zanaflex.  5. Clonazepam.  6. Mepergan  7. Toprol XL 100 mg daily.   PHYSICAL EXAMINATION:  She is in no acute distress.  She arrives in a  wheelchair and is markedly dysarthric.  HEENT:  Unremarkable.  CARDIAC:  No S1, S2, regular rate and rhythm.  LUNGS:  Clear to auscultation.  EXTREMITIES:  Without edema.  ABDOMEN:  Soft, nontender.  Echocardiogram reveals sinus rhythm with a heart rate of 85, normal  axis, no acute changes.   IMPRESSION:  1. Spells.  2. Sinus tachycardia.  3. Cerebral palsy.   PLAN:  The patient describes spells that do not seem to be consistent  with a cardiac source.  I had the chance to talk about this over the  telephone with Dr. [name] who agrees.  He does not recommend any  further cardiac workup at this time.  She should get back and followup  with her primary care physician, as well as her neurologist.  At this  point in time we will continue on the same dose of Toprol and she can be  seen back by Dr. [name] as needed.      Tereso Newcomer, PA-C  Electronically Signed      Pricilla Riffle, MD, [facility]  Electronically Signed   SW/MedQ  DD: 12/29/2006  DT: 12/29/2006  Job #: [id number]   cc:  [name] Artis. Sharene Skeans, M.D.  [name] Brownie, M.D.

## 2011-04-19 NOTE — Discharge Summary (Signed)
[name] [name], QUINONEZ NO.:  [id number]   MEDICAL RECORD NO.:  [id number]          PATIENT TYPE:  INP   LOCATION:  5008                         FACILITY:  [facility]   PHYSICIAN:  Pearlean Brownie, M.D.DATE OF BIRTH:  [birth year]   DATE OF ADMISSION:  06/10/2005  DATE OF DISCHARGE:  06/13/2005                                 DISCHARGE SUMMARY   DISCHARGE DIAGNOSES:  1.  Methicillin-sensitive Staphylococcus aureus cellulitis involving the      left knee.  2.  Stage III left knee ulcer.  3.  Depression.  4.  Cerebral palsy.  5.  Constipation.   PROCEDURES:  1.  Left knee joint aspiration.  2.  X-ray of the left knee.   DISCHARGE MEDICATIONS:  1.  Dicloxacillin 500 mg p.o. q.6h. x1 week.  2.  Baclofen 10 mg every six hours and pump.  3.  Miralax 17 g daily p.o.  4.  Remeron 22.5 mg p.o. daily.  5.  Seroquel 300 mg p.o. q.h.s.  6.  Tylenol No. 3 every four to six hours p.o. as needed for pain.  7.  Xanax 0.5 mg p.o. b.i.d.  8.  Zanaflex 2 mg in the morning p.o., 4 mg afternoon, 2 mg at night.   HISTORY OF PRESENT ILLNESS:  This is a 35 year old female with cerebral  palsy who presented with a two-week history of a pressure ulcer of the  lateral aspect of the left leg at the fibular head that is associated with  erythema and edema involving the left lower leg and up to the level of the  midthigh.  She had been treated for the past week with Bactrim Double  Strength three times daily; however, the ulcer continued to drain.  The  patient was afebrile and had a normal white count on admission.  The patient  also was noted to have pain with flexion and extension of the left knee.   HOSPITAL COURSE:  Problem 1.  CELLULITIS:  The patient's cellulitis involved the entire left  lower extremity from the foot up to the midthigh area.  She had been treated  for the past week with double strength Bactrim, however had not improved.  This was likely due to the fact that the  patient needs to crawl around her  apartment that is not wheelchair-accessible, so we admitted the patient and  started her on vancomycin IV; however, when the wound culture results came  back, they demonstrated methicillin-sensitive Staphylococcus aureus so we  switched her antibiotics to IV Ancef.  She was on IV antibiotics for a total  of two days and on the day of discharge, we switched her to p.o.  dicloxacillin.  Of note, the wound culture demonstrated resistance to  Bactrim.  The patient also had blood cultures drawn, which were negative.  The joint fluid aspiration was done for concern of a possible septic joint;  however, this did not grow any organisms.  The patient remained afebrile and  did not show any signs of systemic illness throughout the hospitalization.  Her cellulitis improved progressively each day and on the day of  discharge,  she had erythema only immediately surrounding the ulcer.  Swelling had  decreased in her leg almost completely, and she denied having any pain in  the extremity.  We arranged for the patient to have a home health aide come  and do damp to dry dressing changes twice daily.  The patient will be  wrapping her knee with two Ace bandages so that she will have extra padding  and this will not slip.  She was advised to if at all possible stay off her  knee and to use a portable toilet rather than crawling in the bathroom.  The  patient again refused placement this hospitalization.  She is competent and  her boyfriend feels comfortable with aiding her in the dressing changes and  giving her the medication appropriately.  The patient was discharged in good  condition, and the follow-up was arranged with Dr. Iven Finn in the family  practice center for early next week.  The patient's pain was managed with  Tylenol No. 3, and she was to take this for any pain that occurred when she  goes home.  A knee x-ray was done, and this did not show evidence of   osteomyelitis.   Problem 2.  CEREBRAL PALSY:  The patient is wheelchair-bound.  This was  stable throughout the hospitalization, and we just continued her home  regimen of Baclofen.  She will have a home health aide and PT/OT when she  returns to her apartment.   Problem 3.  DEPRESSION:  This remained stable throughout the  hospitalization.  We continued her on her regimen of Remeron and Seroquel.   DISCHARGE LABORATORY DATA:  White blood count 8.1, hemoglobin 11.1,  creatinine 0.7.  Joint fluid without organisms.  Blood cultures negative.  Wound culture has methicillin-sensitive Staphylococcus aureus.   FOLLOW-UP INSTRUCTIONS:  1.  The patient is to stay off her knees as much as possible.  She is to pad      her knees with double Ace bandages.  2.  She is to take all medications as prescribed.  3.  She was asked to return to the hospital if she develops fever, chills or      increased swelling, redness or drainage in her left leg.  4.  The patient will follow up with Dr. Iven Finn in clinic at the family      practice center on Tuesday, July 18, at 3:30 p.m.       KS/MEDQ  D:  06/13/2005  T:  06/13/2005  Job:  [id number]   cc:   Altamese Cabal, M.D.  Fax: [phone]   [facility]

## 2011-04-19 NOTE — H&P (Signed)
[name], [name] [name] NO.:  [id number]   MEDICAL RECORD NO.:  [id number]          PATIENT TYPE:  INP   LOCATION:  3016                         FACILITY:  [facility]   PHYSICIAN:  Deanna Artis. Hickling, M.D.DATE OF BIRTH:  [birth year]   DATE OF ADMISSION:  09/08/2006  DATE OF DISCHARGE:                                HISTORY & PHYSICAL   CHIEF COMPLAINT:  Blackouts.   HISTORY OF PRESENT ILLNESS:  A 35 year old with spastic quadriparesis and  dystonia, severe dysarthria, wheelchair bound with ability to transfer  (usually with assistance), who received intrathecal baclofen for the past  seven years.  The patient has had episodes over the past month that  initially began 1-4 times a day during which time she would have periods of  unawareness followed by confusion.  She was seen in the emergency room  August 24, 2006, and was given Lorazepam 0.5 mg.  She took doses from  September 23-October 1, when I told her not to take any more.  She has taken  two doses since that time.  Interestingly, the episodes significantly  decreased.  She has had a total of two episodes since that time, one  Wednesday of last week and one Friday of last week (October 3 and 5).  These  lasted 2-5 minutes.  The last one when she was in a book store at school.  She was not certain what she was doing.  Her eyes have apparently become  bloodshot and red, and she is unresponsive.  She has no memory for the  event, and has 15-30 minutes of postictal confusion.  She does not have  convulsions, tongue biting, loss of urinary continence.   The patient had a CT scan of the brain done during the September 23  hospitalization which was normal and CBC which was normal.   The patient is admitted for prolonged video EEG to be monitored by Dr.  Deneen Harts.   PAST MEDICAL HISTORY:  1. Very premature infant with cerebral palsy preservation of cognition and      right greater than left.  2.  Hospitalization for failure to thrive November 1-23, 2005  The patient      was not eating.  She lost 30 pounds.  Workup was negative.  3. [facility] Ezzard Flax.  She is now on multiple      medications for depression/anxiety and obsessive compulsive disorder.  4. In her teen years, she had a six month, one year history of seizures.      We do not know if she was placed on any epileptic drugs.  5. She has also had neurogenic bladder, gastroesophageal reflux disease.      She had a lesion on her shin that became secondarily infected from a      fall about a year ago.  6. She has endometriosis and required surgery.  She had an abnormal Pap      smear with low grade in situ lesion.   PAST SURGICAL HISTORY:  1. Cesarean section 1997.  2. Bilateral tubal ligation recently.  3. Implantation of Baclofen pump August 2000.  4. Excisional examination for endometriosis in 2002.   FAMILY HISTORY:  Noncontributory.   SOCIAL HISTORY:  The parents are of limited support, although, father is  here today.  She is estranged from them.  She has two children by different  fathers.  One of who lives with her, one who lives with her parents, I  think.  She recently graduated from [employer]  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  Does not use alcohol, tobacco or drugs.   MEDICATIONS:  1. Effexor 150 XR two at bedtime.  2. Seroquel 200 mg at bedtime.  3. Clonazepam 1 mg one half in the morning and one at nighttime.  4. Baclofen 10 mg q.i.d.  5. Xanaflex 2 mg one at 8 a.m. and 5 p.m., two at 12 noon and bedtime.  6. Nitrofuran 150 mg daily.  7. She has been on Lorazepam as noted above.   ALLERGIES:  None known.   PHYSICAL EXAMINATION:  VITAL SIGNS:  We do not have vital signs at the time  of this dictation.  HEENT:  Wax is in her external auditory canal.  No infection.  No bruits.  No meningismus.  LUNGS:  Clear.  HEART:  No murmurs.  ABDOMEN:  Soft.  Bowel sounds  normal.  EXTREMITIES:  Spastic rigidity, clonus deformity of the right foot,  contractures both heel cords.  NEUROLOGICAL:  The patient was awake and alert and without dysphagia but  severe dysarthria that is nearly unintelligible.  Cranial nerves round and  reactive.  Pupils:  Visual fields full.  Extraocular movements full.  Mild  facial weakness.  She protrudes her tongue and the tongue is furrowed.  She  has decreased movement of her uvula.  Motor examination:  Spastic, Rigid,  bilateral hemiparesis bearing the left more than the right arm.  Dystonia  right more than left.  Sensory examination intact to primary sensation.  Left greater than right on sterile diagnosis.  Cerebellar cannot be tested  adequately.  Gait:  She cannot walk independently due to reflexes diminished  to absent.  She does not have ankle clonus.  She has bilateral extensor  plantar responses.   IMPRESSION:  1. Transient alteration of awareness (780.02).  2. Spastic Athetoid quadriparesis (343.2, 333.7).  3. Dysarthria (784.5).  4. Major depression with obsessive compulsive disorder.   PLAN:  Prolonged video EEG.  We are not going to give her lorazepam this  hospitalization.  She will be under my care for the next 24 hours and then  under the care of Dr. Imagene Gurney in my absence.      Deanna Artis. Sharene Skeans, M.D.  Electronically Signed     [facility]/MEDQ  D:  09/08/2006  T:  09/09/2006  Job:  [id number]   cc:   Pearlean Brownie, M.D.

## 2011-04-19 NOTE — Consult Note (Signed)
NAMEHELLEN, SHANLEY              ACCOUNT NO.:  [id number]   MEDICAL RECORD NO.:  [id number]          PATIENT TYPE:  INP   LOCATION:  5735                         FACILITY:  [facility]   PHYSICIAN:  Deanna Artis. Hickling, M.D.DATE OF BIRTH:  11/27/1976   DATE OF CONSULTATION:  10/03/2004  DATE OF DISCHARGE:                                   CONSULTATION   REFERRING PHYSICIAN:  Dr. Pearlean Brownie.   CHIEF COMPLAINT:  Spasticity and weight loss.   DICTATION ENDED AT THIS POINT.       [facility]/MEDQ  D:  10/03/2004  T:  10/03/2004  Job:  [id number]   cc:   Pearlean Brownie, M.D.  Fax: [phone]

## 2011-04-19 NOTE — Assessment & Plan Note (Signed)
[facility]                            CARDIOLOGY OFFICE NOTE   [name] [name], [name] [name]                     MRN:          [id number]  DATE:01/15/2007                            DOB:          26-Aug-1976    NEUROLOGIST:  Deanna Artis. Sharene Skeans, M.D.   PRIMARY CARE PHYSICIAN:  Pearlean Brownie, M.D.   INTERVAL HISTORY:  [name] [name] is a 35 year old woman with a history of  cerebral palsy as well as sinus tachycardia and blackout spells. She  presents today saying that she has had recurrent spells. She said this  morning she woke up and just did not know where she was. She is not sure  if she passed out or not, but does not think so. She denies any chest  pain, shortness of breath or palpitations with this. She has been having  these spells recently. She was seen by Dr. Sharene Skeans in Neurology, who is  also unsure what the etiology is. She has had video monitoring for  seizures and this was negative. She has also had an extensive workup  with Holter monitor and echocardiogram and these have all been negative.  She is on Toprol for somewhat of an inappropriate sinus tachycardia, but  this has been much better since she has been off of Toprol.   MEDICATIONS:  1. Effexor 300 mg a day.  2. Seroquel 200 mg at night.  3. Baclofen.  4. Mepergan.  5. Zanaflex.  6. Clonazepam.  7. Toprol XL 100.   PHYSICAL EXAMINATION:  She is in no acute distress. She is sitting in a  wheelchair and is dysarthric.  HEENT: Sclerae anicteric. EOMI. There is no xanthelasma. Mucus membranes  are moist. Dentition is terrible.  CARDIAC: Is a regular rate and a rhythm. No murmurs, rubs or gallops.  LUNGS:  Are clear.  ABDOMEN: Soft, nontender, nondistended. No hepatosplenomegaly. No  bruits. No masses.  EXTREMITIES: Are warm with no cyanosis, clubbing or edema.   ASSESSMENT/PLAN:  Syncope/spells. She has had a thorough cardiac workup,  although she does have some periods of  apparent inappropriate sinus  tachycardia. I do not think her spells are related to a cardiac nature.  Her tachycardia has been much improved with the beta-blocker.  At this point, I do not think she needs further cardiac workup and will  turn her work up back over to Dr. Sharene Skeans and Dr. Deirdre Priest.     Bevelyn Buckles. Bensimhon, MD  Electronically Signed    DRB/MedQ  DD: 01/15/2007  DT: 01/15/2007  Job #: [id number]

## 2011-04-19 NOTE — Assessment & Plan Note (Signed)
[facility]                            CARDIOLOGY OFFICE NOTE   [name] [name], [name] [name]                     MRN:          [id number]  DATE:11/11/2006                            DOB:          [birth year]    PATIENT IDENTIFICATION:  [name] is a 35 year old woman with  cerebral palsy, who returns today for routine followup of her sinus  tachycardia.   PAST MEDICAL HISTORY:  1. Cerebral palsy, wheelchair-bound, also significant dysarthria.  2. Sinus tachycardia with heart rates up to 150 recently while in the      hospital.      a.     Echocardiogram October 2007 with EF of 65%, no valvular       abnormalities.      b.     Normal thyroid panel.   CURRENT MEDICATIONS:  1. Toprol 100 a day.  2. Effexor 300 nightly.  3. Seroquel 200 a day.  4. Baclofen 10 q.i.d.  5. Mepergan.  6. Zanaflex.  7. Klonopin.   INTERVAL HISTORY:  [name] returns today for routine followup.  From  a cardiac point of view she is doing quite well.  She denies any  palpitations.  She did have 2 episodes of apparent loss of consciousness  which was due to low blood sugar, which was treated in the emergency  room.  She seems to be tolerating her Toprol well.  There has not been  any documented hypertension.   PHYSICAL EXAMINATION:  She is sitting in a wheelchair, she is markedly  dysarthric and somewhat contracted, but no respiratory distress.  Blood pressure is 121/89, pulse is 84.  HEENT:  Sclerae are anicteric, EOMI.  There is no xanthelasma.  Mucous  membranes are moist.  There is bad dentition.  NECK:  Supple, no JVD.  Carotids are 2+ bilaterally without any bruits.  There is no lymphadenopathy or thyromegaly.  CARDIAC:  Regular rate and rhythm, no murmurs, rubs or gallops.  LUNGS:  Clear anteriorly.  ABDOMEN:  Soft, nontender, nondistended.  There is no  hepatosplenomegaly, no bruits, no masses.  EXTREMITIES:  She has limited mobility in all 4 extremities.   There is  no cyanosis, clubbing or edema.  NEUROLOGIC:  She is alert and oriented.  Significant dysarthria.   EKG shows a sinus rhythm at a rate of 84 with no ST-T wave changes.   ASSESSMENT AND PLAN:  Tachycardia.  This is much improved on her beta  blocker.  I would continue her current dose for now.  I am unsure of the  underlying etiology for her tachycardia.  As we noted before, we have  seen previous patients with cerebral palsy with significant right  atrialopathies.  For now, I do not have any further recommendations.  I  will see her back in 6 months for routine followup.     Bevelyn Buckles. Bensimhon, MD  Electronically Signed    DRB/MedQ  DD: 11/11/2006  DT: 11/12/2006  Job #: [id number]   cc:   Deanna Artis. Sharene Skeans, M.D.  Pearlean Brownie, M.D.

## 2011-04-19 NOTE — H&P (Signed)
NAMEIXEL, BOEHNING              ACCOUNT NO.:  [id number]   MEDICAL RECORD NO.:  [id number]          PATIENT TYPE:  INP   LOCATION:  5735                         FACILITY:  [facility]   PHYSICIAN:  Santiago Bumpers. Hensel, M.D.DATE OF BIRTH:  1976-06-26   DATE OF ADMISSION:  10/02/2004  DATE OF DISCHARGE:                                HISTORY & PHYSICAL   CHIEF COMPLAINT:  Depression and weight loss.   HISTORY OF PRESENT ILLNESS:  Evelette is a 35 year old white female with  cerebral palsy, a history of major depression, and OCD, who has been  followed over the past several months as an outpatient at [facility] for severe depression and weight loss.  She returned to the family  practice clinic today with continued severe depression and minimal p.o.  intake.  She reports no improvement in her mood, despite changes in her  medications by Dr. Mila Homer, her psychiatrist.  She reports feeling full after  only 2-3 bites of food at lunch and dinner, which are her only 2 meals a  day.  She denies snacking any other time during the day.  She does feel that  she has been drinking plenty of fluids and is voiding well.  She denies any  problems with constipation or diarrhea.   I spoke with her psychiatrist and case manager, Hardie Lora, both on  September 25, 2004, and they both expressed concern about Legend's depression  and weight loss, and recommended hospitalization.  They themselves have  tried to obtain hospitalization at a psychiatric facility, but have been  unable to secondary to her multiple medical issues.  I expressed the  opinions of Dr. Mila Homer, Ms. Walker, and myself regarding the need for  hospitalization to Alaynah on that day, and asked that she follow up with me  in the family practice clinic today.  Olanna agrees that things are not  improving, but her main concerns are losing her children to foster care and  ending up in a group home.  She is agreeable to admission to  the hospital to  help her depression and her nutrition status.  At this time, she currently  denies suicidal ideation.   PAST MEDICAL HISTORY:  1.  Major depression.  2.  Obsessive-compulsive disorder.  3.  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  4.  Dysarthria with mild dysphagia.  5.  History of constipation.  6.  Abnormal weight loss.  7.  Amenorrhea with last menses in June 2005, and negative pregnancy test      x2.  8.  Recent urinary tract infections with pansensitive E. coli in August and      September 2005.  Last diagnosed and treated September 24, 2004.  9.  History of neurogenic bladder.  10. Degenerative joint disease.  11. History of endometriosis.  12. Gastroesophageal reflux disease.  13. History of gestational diabetes with last pregnancy.  14. History of low-grade SIL on Pap smears from 2000 to 2002, with normal      Pap smear in August 2005.  PAST SURGICAL HISTORY:  1.  Cesarean section in 1997.  2.  Bilateral tubal ligation.  3.  Baclofen pump insertion in August of 2000.  4.  Laparoscopic excision of endometriosis in November of 2002.   MEDICATIONS:  1.  Baclofen pump, last recharged September 21, 2004.  2.  Baclofen 10 mg p.o. daily.  3.  Lamictal 100 mg p.o. daily.  4.  MiraLax once daily.  5.  Prevacid 30 mg daily.  6.  Seroquel 200 mg p.o. q.h.s.  7.  Xanax XR 2 mg p.o. q.a.m.  8.  Zoloft 200 mg p.o. daily.  9.  Zanaflex 2 mg tabs, 1 p.o. q.a.m., 2 p.o. q noon, 1 p.o. q.p.m., and 2      p.o. q.h.s.  10. Tylenol with codeine or Darvocet as needed.   ALLERGIES:  No known drug allergies.   OTHER DOCTORS:  1.  Dr. Sharene Skeans of neurology.  2.  Dr. Mila Homer at [facility] (phone number [phone]).  3.  Case manager - Hardie Lora (cell phone number [phone]; office      number [phone]).   SOCIAL HISTORY:  The patient has 2 children, a son, [name], born in 70,  and a daughter,  Montel Clock, born in 2003.  She has a common law husband names  Ailene Ards (phone number [phone]) from whom she is currently separated,  although he is still involved in her daily life.  She is confined to an  electric wheelchair, and receives 20 hours a week of personal care services.  She denies any tobacco, alcohol, or other drugs.  Currently, her son is  living with her father, and her daughter is living with her sister-in-law.  [facility] has been called in the past by her case manager, and the patient is very  concerned about losing her children to foster care and ending up in a group  home.   FAMILY HISTORY:  No history of diabetes, CAD, or hypertension.  She has a  grandmother who died of colon cancer.   OTHER CONTACT INFORMATION:  1.  Father - Marti Mclane (cell phone number [phone]; office [phone],      extension [phone]).  2.  Brother - Honestie Kulik (work number [phone]; cell phone [phone]).   REVIEW OF SYSTEMS:  Positive for significant weight loss of about 30 pounds  since January of 2005, as the patient weighed 126 at that time, and fell to  121 pounds in June of 2005, and then down to 101 pounds in September of  2005.  The patient denies nausea, vomiting, or diarrhea.  She has had some  increased spasticity recently, and per her case manager, it appears her CP  is worsening in general.  The patient has no history of suicidal attempts,  but does have recent suicide ideation, having called the emergency line at  [facility] at least twice in the month of October.   OBJECTIVE:  VITAL SIGNS:  As noted, with a weight of 98.6 pounds.  Blood  pressure 105/73, temperature 98.7.  GENERAL:  She is a disheveled, thin, white female with a flat affect and  depressed mood, sitting in a contractured posture in an electric wheelchair.  Speech is dysarthric.  She is alert and oriented to person, place, and time. HEENT:  Pupils equal, round and reactive to light.  Extraocular  movements  intact.  Sclerae white and noninjected.  Moist mucous membranes.  OP clear  without erythema or exudate.  Poor dentition with multiple caries and  missing teeth.  NECK:  Supple with no lymphadenopathy or thyromegaly.  HEART:  Regular rate and rhythm.  No murmurs, rubs, or gallops.  LUNGS:  Clear to auscultation bilaterally with fair effort.  ABDOMEN:  Soft, nontender, nondistended.  Normal bowel sounds.  EXTREMITIES:  All four limbs are in contracted postures, as are her fingers.  No edema.   LABORATORY DATA:  Pending.   ASSESSMENT AND PLAN:  A 35 year old white female with severe cerebral palsy,  depression, and abnormal weight loss which has failed outpatient management.  1.  Severe cerebral palsy.  Will obtain records from Dr. Darl Householder office      to make sure she is on her correct medications, and ask that Dr.      Sharene Skeans come to see the patient to see if he believes her cerebral      palsy is worsening, and, if so, what more we can do to assist her.  2.  Severe depression.  For now, will continue her current medications as      prescribed by Dr. Mila Homer, which include Xanax, Zoloft, and Seroquel.  Will      consult Dr. Jeanie Sewer for assistance with management of her severe      depression.  3.  Abnormal weight loss.  The patient has lost about 30 pounds since      January 2005.  The likely etiology is her severe depression, although      her severe cerebral palsy may also be playing a role.  Will check a CBC      with differential, CMP, TSH, and ask for a calorie count and nutrition      consult.  Will encourage p.o. intake and add ensure supplements.      Consider Remeron as a medicine to treat both her depression, and to help      with appetite stimulation.  The patient may also benefit from Megace.      However, it is my hope that by improving the management of her      depression, we will also reverse her weight loss.   DISPOSITION:  Social work case management  consult for any home health needs,  although the concern for both Therasa and her professional care givers is  that she may need placement for her declining status, which is not what she  wants, but which may be in her best interest.       JM/MEDQ  D:  10/03/2004  T:  10/03/2004  Job:  [id number]   cc:   Dr. Evorn Gong [facility]   Deanna Artis. Sharene Skeans, M.D.  [address]  [address]  [place]  [place] [address]  Fax: [phone]   Georgina Peer, M.D.  [name] [place], [place] [address]  Fax: [phone]

## 2011-04-19 NOTE — Discharge Summary (Signed)
NAMEDASHIA, CALDEIRA NO.:  [id number]   MEDICAL RECORD NO.:  [id number]          PATIENT TYPE:  INP   LOCATION:  5009                         FACILITY:  [facility]   PHYSICIAN:  Pearlean Brownie, M.D.DATE OF BIRTH:  03-28-1976   DATE OF ADMISSION:  02/12/2005  DATE OF DISCHARGE:  02/21/2005                                 DISCHARGE SUMMARY   ADDENDUM:  Seeing that there were no overnight events from last dictation  summary, the patient is actually being discharged on February 21, 2005, to home  with home health.  The patient has refused assisted living facility.  She  will continue to take double-strength Bactrim b.i.d. for at least a full 14  day course.  We have checked with ortho ___________,and try to contact wound  care for any additional recommendations of how to protect the wound at home,  but no suggestions were made, therefore, the patient will have  _________wrapping over her dressing in an attempt to decrease morbidity.      AD/MEDQ  D:  02/21/2005  T:  02/21/2005  Job:  [id number]

## 2011-04-19 NOTE — Discharge Summary (Signed)
[name] [name], [name] [name] NO.:  [id number]   MEDICAL RECORD NO.:  [id number]          PATIENT TYPE:  IPS   LOCATION:  4037                         FACILITY:  [facility]   PHYSICIAN:  Ellwood Dense, M.D.   DATE OF BIRTH:  08/20/1976   DATE OF ADMISSION:  10/10/2004  DATE OF DISCHARGE:  10/24/2004                                 DISCHARGE SUMMARY   DISCHARGE DIAGNOSES:  1.  Spastic quadriparesis secondary to cerebral palsy.  2.  Pain management.  3.  Major depression with obsessive-compulsive disorder.  4.  Gastroesophageal reflux disease.  5.  Constipation.   HISTORY OF PRESENT ILLNESS:  A 35 year old white female advanced cerebral  palsy, major depression admitted October 02, 2004 with failure to thrive,  decreased appetite.  Neurology followed by Dr. Sharene Skeans.  Her Baclofen by  mouth was adjusted.  She did have an implanted Baclofen pump recently  recharged.  Zoloft discontinued October 07, 2005.  Effexor increased to 150  mg daily per psychiatry services.  She was admitted for comprehensive  rehabilitation program.   PAST MEDICAL HISTORY:  Cerebral palsy, neurogenic bladder, gastroesophageal  reflux disease, endometriosis.  She has had a C-section.  History of major  depression with OCD, also implantation of a Baclofen pump August of 2000.   SOCIAL HISTORY:  Lives alone in [place].  Boyfriend lives in the same  apartment complex with limited assistance.  Receives 20 hours a week of  personal care services.  She has an 51-year-old son currently with her  father, a 3-year-old daughter with her sister-in-law.  Her discharge plan  was to group home.  The patient used a powered wheelchair prior to  admission.  She was independent bed-to-chair transfers prior to admission.   MEDICATIONS PRIOR TO ADMISSION:  1.  Xanax 0.5 mg three times daily.  2.  Baclofen 10 mg four times daily.  3.  Lamictal 100 mg bedtime.  4.  Remeron 22.5 mg daily.  5.  Protonix 40 mg  daily.  6.  MiraLax twice daily.  7.  Seroquel 200 mg at bedtime.  8.  Effexor 150 mg daily.  9.  Zanaflex 2 mg in the a.m. and p.m., 4 mg at 12 noon and bedtime.   HOSPITAL COURSE:  The patient with progressive gains while in rehabilitation  services with therapies initiated on a b.i.d. basis.  The following issues  were followed during the patient's rehabilitation course:  Pertaining to Ms.  Magaw's spastic quadriparesis secondary to cerebral palsy, her flexor tone  remained controlled with combination of Zanaflex and Baclofen.  She also had  a Baclofen pump implanted in August of 2000 which had just been recharged on  October 22, 2004 prior to discharge to group home.  She had thigh high  stockings on for deep venous thrombosis prophylaxis.  She was followed by  psychiatry services for major depression with obsessive-compulsive disorder.  She remained on her Sequel, Effexor, Remeron and Xanax.  Her Lamictal had  been decreased to 50 mg at bedtime per advisement of psychiatry services.  She had no gastrointestinal complaints, maintained on Protonix,  chronic  constipation with the use of MiraLax which was decreased to daily at the  patient's request.  Her blood pressures remained controlled without the use  of antihypertensive medications.  The discharge plan was to group home which  she had visited on a day pass while in rehabilitation services.   Latest laboratories showed a sodium of 138, potassium 3.9, BUN 12,  creatinine 0.8, hemoglobin 11.2, hematocrit 32.8.   DISCHARGE MEDICATIONS:  1.  Effexor 150 mg daily.  2.  Protonix 40 mg daily.  3.  Seroquel 200 mg at bedtime.  4.  Zanaflex 2 mg at 8 a.m. and 6 p.m., 4 mg at 12 noon and 10 p.m.  5.  Xanax 0.5 mg three times daily.  6.  MiraLax 17 grams daily with 8 ounces of water.  7.  Baclofen 10 mg four times daily.  8.  Remeron 22.5 mg at bedtime.  9.  Lamictal 50 mg at bedtime.   ACTIVITY:  As tolerated.   DIET:   Regular.   FOLLOWUP PLAN:  To be to Dr. Sharene Skeans of neurology services, Dr. Deirdre Priest,  outpatient clinic.       DA/MEDQ  D:  10/23/2004  T:  10/23/2004  Job:  [id number]   cc:   Deanna Artis. Sharene Skeans, M.D.  [address]  [address]  [place]  [place] [address]  Fax: [phone]   Antonietta Breach, M.D.   Pearlean Brownie, M.D.  Fax: [phone]

## 2011-04-19 NOTE — Procedures (Signed)
CLINICAL HISTORY:  The patient is a 35 year old with back to quadriparesis  and dystonia.  She has had recent onset of black out spells.  Study is being  done to look for presence of seizures (780.2).   PROCEDURE:  The tracing is carried out on a 37 digital Cadwell recorder  reformatted into 16 channel montages with one devoted to EKG.  The patient  was awake during the recording.  The [employer] 10/20 system lead  placement used.  She is on multiple psychotropic medications.  List was not  available at the time of this dictation.   DESCRIPTION OF FINDINGS:  Dominant frequency is an 9 Hz 30-45 microvolt  activity that is well regulated.   Background activity shows mixed frequency upper theta and lower alpha range  activity and frontally predominant beta range components.   The patient becomes drowsy with mixed frequency theta and delta range  components, but does not drift into natural sleep.   Activating procedures with photic stimulation induced a driving response  from 4-03 Hz.  Hyperventilation.  Hyperventilation caused arousal but no  other change.  There was no interictal epileptiform activity in the form of  spikes or sharp waves.   EKG showed regular sinus rhythm with ventricular response of 96 beats per  minute.   IMPRESSION:  Normal record with the patient awake and drowsy.      Deanna Artis. Sharene Skeans, M.D.  Electronically Signed     [id number]:[id number]  D:  08/27/2006 15:02:08  T:  08/29/2006 12:12:37  Job #:  [id number]

## 2011-04-19 NOTE — Assessment & Plan Note (Signed)
[facility]                              CARDIOLOGY OFFICE NOTE   CHYLA, SCHLENDER                     MRN:          [id number]  DATE:09/29/2006                            DOB:          Feb 06, 1976    PATIENT IDENTIFICATION:  [name] is a 35 year old woman with cerebral  palsy due to prematurity who presents today for routine followup of her  sinus tachycardia.   PAST MEDICAL HISTORY:  1. Cerebral palsy, wheelchair bound.  History of also significant      dysarthria.  2. Sinus tachycardia with heart rates up into the 150s recently while in      the hospital.      a.     Echocardiogram October 2007, EF of 65%. No significant valvular       abnormalities.      b.     Normal thyroid panel.   CURRENT MEDICATIONS:  1. Toprol 50 a day.  2. Effexor 300 a day.  3. Seroquel 200 a day.  4. Baclofen 10 q.i.d.  5. Zanaflex.  6. Klonopin.  7. Mepergan.   INTERVAL HISTORY:  [name] returns today for routine followup. She says  that her palpitations have been much improved since starting the Toprol. She  does say that occasionally she gets brief spells of shortness of breath, but  these are very transient. She does not associated these necessarily with  palpitations. She does not have chest pain with this. She denies any  significant lower extremity edema. She has not had orthopnea or PND.   PHYSICAL EXAMINATION:  She is wheelchair bound. She is significantly  dysarthric. Blood pressure was 120/100, although the bottom number was quite  hard to hear. Heart rate on EKG was 103. However, when I auscultated her she  was at 24.  HEENT: Sclera anicteric. EOMI.  There is no xanthelasma. Mucus membranes are  moist. Neck is supple. There is no JVD. Carotids are 2+ bilaterally without  bruits. There is no lymphadenopathy or thyromegaly.  CARDIAC: Regular rate and a rhythm. No murmurs, rubs or gallops.  LUNGS:  Are clear anteriorly.  ABDOMEN:  Soft, nontender, nondistended. There is no hepatosplenomegaly. No  bruits. No masses.  EXTREMITIES:  She has limited mobility in all four extremities. There is no  cyanosis, clubbing or edema.  NEURO: She is alert and oriented.  Cranial nerves are grossly intact.   EKG shows a sinus tachycardia rate of 103 with no ST-T wave changes.  Intervals and axes are normal.   ASSESSMENT/PLAN:  Apparent inappropriate sinus tachycardia. This is much  improved with Toprol. I am unclear of the etiology of this, whether this  represents autonomic dysfunction or whether she may have multiple foci of  atrial activity as we have seen in previous patients with cerebral palsy. At  this point we will proceed with putting a 48-hour Holter monitor on her to  quantify her heart rate range. We also will increase her Toprol to 100 mg  once a day. I will see her back in six weeks for routine follow up.  Bevelyn Buckles. Bensimhon, MD    DRB/MedQ  DD: 09/29/2006  DT: 09/29/2006  Job #: [id number]   cc:   Bevelyn Buckles. Nash Shearer, M.D.  Deanna Artis. Sharene Skeans, M.D.

## 2011-04-19 NOTE — Discharge Summary (Signed)
[place]  Patient:    [name], [name] Visit Number: [id number] MRN: [id number]          Service Type: MED Location: [phone] Attending Physician:  Doneta Public Dictated by:   Emelda Fear, M.D. Admit Date:  10/22/2001 Discharge Date: 10/24/2001                             Discharge Summary  CONSULTS:  None.  PROCEDURES:  Pulmonary function tests.  DISCHARGE DIAGNOSES: 1. Hypotension secondary to dehydration. 2. Dehydration. 3. Dyspnea. 4. Severe restrictive lung disease. 5. Endometriosis status post ablation and cryotherapy. 6. Major depression. 7. Obsessive-compulsive disorder. 8. Cerebral palsy. 9. Gastroesophageal reflux disease.  DISCHARGE MEDICATIONS:  1. Celebrex 100 mg p.o. q.d.  2. Klonopin 0.5 mg 1 tablet b.i.d.  3. Levoxyl 100 mg 1 tablet p.o. q.d.  4. Prevacid 30 mg 1 tablet p.o. q.d.  5. Seroquel 100 mg 2 tablets q.d.  6. Wellbutrin 150 mg 1 tablet twice a day.  7. MiraLax 1 tsp in 8 ounces of water every day.  8. Baclofen pump.  9. Zanaflex 2 mg 2 tablets q.d. 10. Xanax 1 mg 3 times a day p.r.n. anxiety 11. Tylenol with Codeine 1 tablet q.4h. p.r.n. pain. 12. Recommend the patient complete antibiotic course prescribed by     obstetrics/gynecology [facility]. 13. Albuterol nebulizer q.4-6h. p.r.n. shortness of breath.  HISTORY OF PRESENT ILLNESS:  Mr. Rondeau is a 35 year old Caucasian female who is recently status post endometrial ablation and cryotherapy, who presents to [facility] secondary to a two-month history of dyspnea and found to be hypotensive on presentation.  The patient was admitted to the hospital for resuscitation of hypotension and to rule out any particular causes.  The patient was initiate don IV fluids upon presentation.  The patient was also initiated on Zosyn therapy for any potential cause of infection.  On presentation, white count was 8.0 and the  patient was afebrile. Urinalysis was not evident of infection.  Blood cultures remained negative throughout admission.  Urine culture was negative.  Chest x-ray was within normal limits, other than a questionable early right lower lobe infiltrate appreciated.  After aggressive IV hydration and normalization of the patients blood pressure, after rule out for any infection, the patient was discharged home on hospital day #3 in stable condition.  The patient will have close followup at the [facility].  Also of note, Dr. Sharene Skeans was contacted in regards to the patients complex medication list for her cerebral palsy.  At this time, Dr. Sharene Skeans does not feel that any medications are contributing to the hypotension of dyspnea.  HOSPITAL COURSE: #1 - HYPOTENSION:  Unclear exactly the etiology of hypotension.  The patient was admitted to rule out sepsis secondary to infection.  Chest x-ray did reveal possible early pneumonia.  However, the patient remained afebrile with a normal white count without cough of sputum production throughout admission. We wanted to rule out acute blood loss secondary to recent surgery.  The patients hemoglobin was 11.9 on presentation and remained stable throughout admission.  Severe dehydration was definitely in the differential.  The patient is status post surgery in which the patient developed pulmonary edema postoperatively.  The patient received aggressive diuresis while at [facility].  With aggressive IV hydration, the patients blood pressure returned to normal limits.  Also of note, the patients BUN and  creatinine decreased significantly throughout admission, consistent with prerenal etiologies.  At this time, it does not seem that the patient was overdiuresed postoperatively at [facility] and hypotension was secondary to dehydration.  Empiric antibiotics were initiated upon presentation.  The patient received three days of Zosyn  therapy.  #2 - DEHYDRATION:  As noted above, the patients BUN and creatinine upon presentation was 25 and 1.1.  On the day of discharge, BUN was 9 and creatinine was 0.7.  Specific gravity on presentation was 1.030 consistent with dehydration.  The patients blood pressure on presentation was 78/62. Blood pressure on discharge was 132/73.  The patient remained hemodynamically stable throughout her admission.  We encouraged the patient to drink plenty of fluids as an outpatient.  #3 - DYSPNEA:  The patient reports a two-month history of persistent dyspnea. Differential diagnoses includes possible right lower lobe pneumonia versus pulmonary etiology.  The patient received PFTs during her admission which revealed the following:  Severe restrictive lung disease with an SVC of 0.64, FEV1 of 0.59, FES max of 0.76, FEV 1 to FEC ratio of 93, PIFR of 1.54.  The patients O2 saturation on room air ranged from 92 to 97%.  Therefore, the patient did not qualify for home O2.  We will follow the patient closely as an outpatient to see if dyspnea improves in the upcoming few weeks.  Further investigations of restrictive lung disease can be done as an outpatient.  Most likely, it is secondary to cerebral palsy.  #4 - SEVERE RESTRICTIVE LUNG DISEASE:  As mentioned above, most likely secondary to cerebral palsy.  The patient has normal O2 saturations on room air without difficulty.  #5 - CEREBRAL PALSY:  Continue current medical regimen per Dr. Sharene Skeans.  As mentioned above, Dr. Sharene Skeans does not feel progression of dyspnea or hypotension is secondary to current drug regimen.  We will have the patient follow up in the upcoming few weeks with Dr. Sharene Skeans.  The patient functions quite independently despite cerebral palsy.  #6 - ENDOMETRIOSIS:  The patient is status post ablation and cryotherapy at [facility] obstetrician/gynecology.  The patient was discharged on antibiotic therapy and we have  recommended that she continue that course of antibiotic therapy upon discharge to home.  She will follow up this week on  Wednesday for postop and suture removal at [facility].  #7 - DEPRESSION:  We continued the patients psychiatric medications while in the hospital.  DISCHARGE LABORATORIES:  Sodium 139, potassium 5.8, chloride 108, CO2 25, BUN 9, creatinine 0.7, blood glucose 160, calcium 8.4.  White blood cells 6.4, hemoglobin 11.7, hematocrit 34.0.  Urine hCG negative.  Urine culture negative for 24 hours, blood culture for 48 hours x2.  Orthostatic blood pressures, while supine, 84/34 with a heart rate of 93, upon sitting 111/57 with a heart rate of 110.  Spiral CT was negative for pulmonary embolism.  Urinalysis revealed yellow-clear 1.030 and small bilirubin.  Of note, this was admit urinalysis.  Albumin 3.3, total protein 6.3.  Chest x-ray revealed questionable early right lower lobe infiltrate.  AST was 17, ALT was 16.  ABG on presentation was 7.370 pH, PCO2 was 45.9, PO2 was 73, bicarb was 26, and O2 saturation was 94%.  DISCHARGE INSTRUCTIONS:  The patient is recommended to call the [facility] this week for followup appointment.  Due to having difficulty getting appointment with Dr. Levora Dredge, recommend the patient attempt to make an appointment with Dr. Levora Dredge, Dr. Ilsa Iha, or Dr. Katrinka Blazing.  SPECIAL INSTRUCTIONS:  Recommend the patient call physician for development of severe weakness or dyspnea.  DIET:  No restrictions.  WOUND CARE:  Not applicable.  ACTIVITY:  No restrictions. Dictated by:   Emelda Fear, M.D. Attending Physician:  Doneta Public DD:  10/24/01 TD:  10/26/01 Job: [id number]

## 2011-04-19 NOTE — Procedures (Signed)
PROCEDURE:  This is a continuous video EEG monitoring.   HISTORY:  This is a 35 year old with blackout spells who is having  continuous video EEG monitoring done to evaluate for seizure activity.   PROCEDURE:  Continuous video EEG monitoring.   TECHNICAL DESCRIPTION:  This continuous EEG monitoring was performed from  October 8-12, 2007.  Each day of recording was reviewed.   Day one of recording was from October 8-9, 2007.  There are no push button  events witnessed.  The patient did state she to events, however did not push  the button.  These events were both given a time frame.  However no push  button events and the exact movements were not recorded.  First range was  between 5:30 and 6:30 p.m.  The patient described the event as a strange  feeling in her body.  She was lying comfortably in bed.  Throughout this  time frame there was no definitive epileptiform activity noticed.  The  second event, the patient stated, occurred during sleep where she woke up  she felt strange and confused throughout the whole entire night's tracing.  There is no evidence of electrographic seizures or epileptiform activity.  In the background of the first day's tracing is fairly symmetric, mostly  comprised of alpha range activity at 20-40 microvolts.  Throughout the  entire day when tracing there is no definitive epileptiform activity  noticed.  Day two recording occurred from October 9-10, 2007, throughout day  #2 of recording, there is one push button event identified.  This push  button event, the patient was lying in bed comfortably, is talking to the  nurse and the patient tells the nurse that she has a strange sensation and  feeling and the nurse pushes button.  Throughout this time frame there is no  EEG change or epileptic epileptiform discharges noted.  The background of  this tracing is fairly symmetric, mostly comprised of alpha range activity  at 25-40 microvolts.  Occasionally there is  EMG movement artifact/electrode  artifact that occasionally obscured the background.  Throughout the day #2  of tracing, there is no definitive epileptiform activity noted.  Day #3 of  recording was from October 10-11, 2007.  Throughout day #3 of recording,  there are no push button events identified.  The background of this tracing  is fairly symmetric and mostly comprised of alpha occasional theta range  activity at 20-40 microvolts.  There is occasional EMG movement and  electrode artifact that has occasionally obscured the background.  Throughout this day #3 recording, there is no definitive epileptiform  activity identified.  Day #4 of recording was from 10, 110-12, 2007.  There  was one push button identified that occurred while the patient was lying in  bed getting her sheets changed.  The nurse pushed a button and there was  question whether it was inadvertently.  The patient also seems to be  disconnected from the machine as they are changing the patient in bed.  Molesting the background is the electrode artifact from being disconnected  and afterwards there is movement artifact.  There is no definitive  epileptiform activity noticed.  The background of this tracing is symmetric  and mostly comprised of alpha range activity at 10-40 microvolts.  Throughout this day #4 of tracing, there is no definitive epileptiform  activity identified.  There is occasional EMG movement artifact that  occasionally obscures the background.   One should noticed throughout the day's recording, EKG tracing  showed  episodes of sinus tachycardia with ranges up to 150 beats per minute.   IMPRESSION:  This continuous video EEG monitoring performed from October 8-  12, 2007.  Had no typical events identified.  The patient had a few push  button events as described above that did not show any evidence of  epileptiform activity.  Throughout this entire tracing, there is no  definitive epileptiform activity  or interictal discharges identified.  The  patient did have occasional bursts of sinus tachycardia noticed on the EKG  tracing with beats up to 120 to 150 beats per minute.  Clinical correlation  is advised.   CC: Dr Crista Curb R. Nash Shearer, M.D.  Electronically Signed     OZD:GUYQ  D:  09/12/2006 12:13:11  T:  09/14/2006 16:59:40  Job #:  [id number]

## 2011-04-19 NOTE — Op Note (Signed)
NAMEARCOLA, FRESHOUR NO.:  [id number]   MEDICAL RECORD NO.:  [id number]          PATIENT TYPE:  IPS   LOCATION:  4037                         FACILITY:  [facility]   PHYSICIAN:  Deanna Artis. 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  The patient receives intrathecal  Baclofen to decrease her spasticity.   PROCEDURE:  The patient was sterilely prepped and draped.  A 1 1/2 inch non-  coring Heubner needle was placed in the central port.  3.2 mL Baclofen  concentration 2000 mcg per mL was withdrawn placing the pump under partial  vacuum.  A 20 mL syringe of Baclofen concentration 2000 mcg per mL was  instilled through a Millipore filter back into the pump filling it  completely.  The reservoir volume was reprogrammed to 18 mL.  The dose per  day was changed to 1050 mcg from 1000 or 43.7 mcg per hour.  The patient  tolerated the procedure well.  Her reservoir alarm date is November 21, 2004.       WHH/MEDQ  D:  10/22/2004  T:  10/22/2004  Job:  [id number]   cc:   Ranelle Oyster, M.D.  [address] [place]  [place] [address]  Fax: [phone]

## 2011-04-20 ENCOUNTER — Encounter: Payer: Self-pay | Admitting: Internal Medicine

## 2011-04-20 ENCOUNTER — Emergency Department (HOSPITAL_COMMUNITY): Payer: Medicaid Other

## 2011-04-20 ENCOUNTER — Emergency Department (HOSPITAL_COMMUNITY)
Admission: EM | Admit: 2011-04-20 | Discharge: 2011-04-20 | Disposition: A | Discharge status: Home / Self Care | Payer: Medicaid Other | Attending: Emergency Medicine | Admitting: Emergency Medicine

## 2011-04-20 DIAGNOSIS — Z79899 Other long term (current) drug therapy: Secondary | ICD-10-CM | POA: Insufficient documentation

## 2011-04-20 DIAGNOSIS — Z7901 Long term (current) use of anticoagulants: Secondary | ICD-10-CM | POA: Insufficient documentation

## 2011-04-20 DIAGNOSIS — M1990 Unspecified osteoarthritis, unspecified site: Secondary | ICD-10-CM | POA: Insufficient documentation

## 2011-04-20 DIAGNOSIS — K219 Gastro-esophageal reflux disease without esophagitis: Secondary | ICD-10-CM | POA: Insufficient documentation

## 2011-04-20 DIAGNOSIS — R079 Chest pain, unspecified: Secondary | ICD-10-CM | POA: Insufficient documentation

## 2011-04-20 DIAGNOSIS — F29 Unspecified psychosis not due to a substance or known physiological condition: Secondary | ICD-10-CM | POA: Insufficient documentation

## 2011-04-20 DIAGNOSIS — F329 Major depressive disorder, single episode, unspecified: Secondary | ICD-10-CM | POA: Insufficient documentation

## 2011-04-20 DIAGNOSIS — F411 Generalized anxiety disorder: Secondary | ICD-10-CM | POA: Insufficient documentation

## 2011-04-20 DIAGNOSIS — R0609 Other forms of dyspnea: Secondary | ICD-10-CM | POA: Insufficient documentation

## 2011-04-20 DIAGNOSIS — G809 Cerebral palsy, unspecified: Secondary | ICD-10-CM | POA: Insufficient documentation

## 2011-04-20 DIAGNOSIS — Z86711 Personal history of pulmonary embolism: Secondary | ICD-10-CM | POA: Insufficient documentation

## 2011-04-20 DIAGNOSIS — R0989 Other specified symptoms and signs involving the circulatory and respiratory systems: Secondary | ICD-10-CM | POA: Insufficient documentation

## 2011-04-20 DIAGNOSIS — F3289 Other specified depressive episodes: Secondary | ICD-10-CM | POA: Insufficient documentation

## 2011-04-20 DIAGNOSIS — R0602 Shortness of breath: Secondary | ICD-10-CM | POA: Insufficient documentation

## 2011-04-20 DIAGNOSIS — R000 Tachycardia, unspecified: Secondary | ICD-10-CM | POA: Insufficient documentation

## 2011-04-20 LAB — CBC
MCV: 86 fL (ref 78.0–100.0)
Platelets: 230 10*3/uL (ref 150–400)
RBC: 4.28 MIL/uL (ref 3.87–5.11)
WBC: 6 10*3/uL (ref 4.0–10.5)

## 2011-04-20 LAB — BASIC METABOLIC PANEL
BUN: 9 mg/dL (ref 6–23)
CO2: 26 mEq/L (ref 19–32)
Chloride: 106 mEq/L (ref 96–112)
Creatinine, Ser: 0.59 mg/dL (ref 0.4–1.2)
Glucose, Bld: 92 mg/dL (ref 70–99)
Potassium: 3.7 mEq/L (ref 3.5–5.1)

## 2011-04-20 LAB — PROTIME-INR: INR: 2.52 — ABNORMAL HIGH (ref 0.00–1.49)

## 2011-04-20 MED ORDER — IOHEXOL 300 MG/ML  SOLN
100.0000 mL | Freq: Once | INTRAMUSCULAR | Status: AC | PRN
  Administered 2011-04-20: 100 mL via INTRAVENOUS

## 2011-04-20 NOTE — Assessment & Plan Note (Signed)
She has had 2 PEs now. Most recent Jan 2012 and it appears that clearance is not great as of followup in March 2012 which is c/w the natural resolution course for PE. She needs coumadin for life. Unclear if current dyspnea and presumed hypoxemia is due to residual PE or deconditioning. We will check overnight oxygen. Dr Janalyn Harder is arranging rehab which I agree with. Will see her in 6 months and at that time consider VQ scan . IF she has persistent PE at fu CTEPH has to be entertained but she would not be a good surgical candidate.  PLAN  Return in 6 months You need coumadin for life When you return, we can decide on ordering the VQ scan We will test your oxygen at night while at sleep My nurse will check your oxygen level at rest on room air

## 2011-04-21 ENCOUNTER — Telehealth: Payer: Self-pay | Admitting: Sports Medicine

## 2011-04-21 NOTE — Telephone Encounter (Signed)
Pt calling but extremely difficult to understand, it sounds like she wanted the results of her recent CT angio which was negative for PE.  She has a hx DVTs and is c/o SOB/CP.  She was wondering why.  I advised I could not tell her why over the phone but if her symptoms were significantly different from baseline, she should go to the ED.  She said she has noone to take her.  I advised she could call 911 and an ambulance would take her.  She thanked me for my time.  I spent over 10 minutes trying to understand the story.

## 2011-04-22 ENCOUNTER — Other Ambulatory Visit: Payer: Self-pay | Admitting: Family Medicine

## 2011-04-22 ENCOUNTER — Ambulatory Visit (INDEPENDENT_AMBULATORY_CARE_PROVIDER_SITE_OTHER): Payer: Medicaid Other | Admitting: Family Medicine

## 2011-04-22 ENCOUNTER — Encounter: Payer: Self-pay | Admitting: Family Medicine

## 2011-04-22 VITALS — BP 137/90 | HR 114 | Temp 98.1°F

## 2011-04-22 DIAGNOSIS — R0609 Other forms of dyspnea: Secondary | ICD-10-CM

## 2011-04-22 DIAGNOSIS — R0600 Dyspnea, unspecified: Secondary | ICD-10-CM

## 2011-04-22 NOTE — Assessment & Plan Note (Addendum)
Pt presents with concerns for chest tightness and dyspnea.  She was seen in ED yesterday and discharged home.  While there she had a CT chest 04/20/11 showed 1.  Multifocal degradation as detailed above.  No evidence of central or large lobar embolism.  2.  No other explanation for chest pain.  3.  Gallstones and/or sludge.  Her INR was 2.52.  We spent over 30 minutes discussing that her dyspnea is likely not another blood clot esp since her INR has been therapeutic and she has had repeated CT scans that do not show new clots.  She also has had multiple cardiac work up with EKGs and serial CE that have not been alarming.  Explained that she has been seen by Pulmonologist, [name], who felt that this may be from deconditioning.  Pt should feel reassured by the work ups she has had.  Pt seems to be pacified by this.  We will continue her PT as long as possible for deconditioning.

## 2011-04-22 NOTE — Progress Notes (Signed)
Addended byJanalyn Harder, Kirra Verga on: 04/22/2011 02:54 PM   Modules accepted: Orders

## 2011-04-22 NOTE — Telephone Encounter (Signed)
[facility] [phone].  Pt has appt with Dr Uvaldo Bristle next week for IUD [name] placement.  Spoke with one of the nurses there.  Left message that I would like to speak to Dr Uvaldo Bristle or her colleagues regarding pt's concerns that she will not be able to get into the stirrups for the procedure.  Pt desires epidural.  I am not in a position to make this decision, but discussed with her that I will bring up her concerns to Dr Uvaldo Bristle.

## 2011-04-23 ENCOUNTER — Ambulatory Visit: Payer: Medicaid Other | Admitting: Physical Therapy

## 2011-04-23 NOTE — Progress Notes (Signed)
  Subjective:    Patient ID: [name] [name], female    DOB: 20-Jun-1976, 35 y.o.   MRN: [id number]  HPI Pt with history of CP, recurrent PE, migraine HA, Psych issues presents for dyspnea and chest tightness that has been present for a few months.  She states that she scared because she developed chest tightness.  She was seen in the ED 1 day ago.  They did another CT chest (her 5th or 6th in the last 12 months) which was negative for PE.  She was found to be therapeutic with INR of 2.52.  Her EKG did not show change from previous.  She was discharged home from ED and they stated that they did not know the cause of her chest pain.   Pt was seen by me one wk ago.  Prior to this she was seen by [name] of [facility] (5/10) for consultation on dyspnea.  Like me, he thought that it could be 2/2 deconditioning and that PT was a good idea.  He wanted to monitor her pulse ox during sleep and see her again in 6 months.  Pt has been seen by PT, but states that Medicaid will only pay for 4 visits per year.  She has another appt next week, which will be her last appt with PT.    Pt is feeling short of breath right now but states that she is not having chest tightness.  She feels as normal otherwise.  Review of Systems No chest pain, nausea, vomiting, abd pain, fever, chills, diarrhea.    Objective:   Physical Exam Gen: NAD, alert PULM: CTA b/l, no wheezing CVS: tachy, otherwise RR, no murmurs EXT: in wheelchair, no edema       Assessment & Plan:

## 2011-04-23 NOTE — Progress Notes (Signed)
Addended by: Shishir Krantz on: 04/23/2011 11:07 AM   Modules accepted: Orders

## 2011-04-24 ENCOUNTER — Ambulatory Visit: Payer: Medicaid Other | Admitting: Family Medicine

## 2011-04-25 ENCOUNTER — Telehealth: Payer: Self-pay | Admitting: Family Medicine

## 2011-04-25 ENCOUNTER — Ambulatory Visit: Payer: Medicaid Other | Admitting: Family Medicine

## 2011-04-25 NOTE — Telephone Encounter (Signed)
Spoke with Dr [name] [name], OBGYN at [facility] regarding [name] [name]'s appt 5/29 for IUD placement for endometriosis.  Discussed [name] [name]'s concerns about the appt and that she would like an epidural.  Dr [name] [name] states that an epidural is not possible in the outpatient setting, which is similar to what I discussed with [name] [name].  We also discussed giving [name] [name] a dose of Valium to take prior to appt.  Dr [name] [name] will call [name] [name] to speak to her about this plan.  Also at her request, I have printed a copy of Pelvic U/S and Endometrial Bx result.  I will have these faxed to Dr [name] [name] today.   Also gave Dr [name] [name] the name and phone number of [facility] so she can call the rx for Valium.

## 2011-04-30 HISTORY — PX: INTRAUTERINE DEVICE INSERTION: SHX323

## 2011-05-01 ENCOUNTER — Ambulatory Visit (INDEPENDENT_AMBULATORY_CARE_PROVIDER_SITE_OTHER): Payer: Medicaid Other | Admitting: *Deleted

## 2011-05-01 DIAGNOSIS — Z7901 Long term (current) use of anticoagulants: Secondary | ICD-10-CM

## 2011-05-01 DIAGNOSIS — Z86718 Personal history of other venous thrombosis and embolism: Secondary | ICD-10-CM

## 2011-05-01 DIAGNOSIS — I2699 Other pulmonary embolism without acute cor pulmonale: Secondary | ICD-10-CM

## 2011-05-03 ENCOUNTER — Encounter: Payer: Self-pay | Admitting: Family Medicine

## 2011-05-03 ENCOUNTER — Encounter: Payer: Self-pay | Admitting: *Deleted

## 2011-05-03 ENCOUNTER — Ambulatory Visit (INDEPENDENT_AMBULATORY_CARE_PROVIDER_SITE_OTHER): Payer: Medicaid Other | Admitting: Family Medicine

## 2011-05-03 ENCOUNTER — Ambulatory Visit (INDEPENDENT_AMBULATORY_CARE_PROVIDER_SITE_OTHER): Payer: Medicaid Other | Admitting: *Deleted

## 2011-05-03 VITALS — BP 110/70 | HR 92 | Temp 98.7°F

## 2011-05-03 DIAGNOSIS — I2699 Other pulmonary embolism without acute cor pulmonale: Secondary | ICD-10-CM

## 2011-05-03 DIAGNOSIS — Z86718 Personal history of other venous thrombosis and embolism: Secondary | ICD-10-CM

## 2011-05-03 DIAGNOSIS — R791 Abnormal coagulation profile: Secondary | ICD-10-CM

## 2011-05-03 DIAGNOSIS — Z7901 Long term (current) use of anticoagulants: Secondary | ICD-10-CM

## 2011-05-03 HISTORY — DX: Other pulmonary embolism without acute cor pulmonale: I26.99

## 2011-05-03 LAB — PROTIME-INR
INR: 6.66 (ref <1.50)
Prothrombin Time: 57.6 seconds — ABNORMAL HIGH (ref 11.6–15.2)

## 2011-05-03 LAB — POCT INR: INR: >8

## 2011-05-03 LAB — POCT HEMOGLOBIN: Hemoglobin: 12.6

## 2011-05-03 NOTE — Progress Notes (Signed)
  Subjective:    Patient ID: [name] [name], female    DOB: 11/30/1976, 35 y.o.   MRN: [id number]  HPI Here for INR check today.  2 days ago POC was 6.5 with venous INR 5.14.  Was told to stop coumadin.    Did not stop coumadin- pills were inadvertently left in her pill box.  Today POC INR >8.  Venous INR 6.6.  No bleeding from gums, no hematuria, no bruising.    Had IUD placed 3 days ago, some light spotting.    Review of Systems see HPI     Objective:   Physical Exam  Constitutional: She appears well-developed and well-nourished.       Wheelchair bound, CP  HENT:  Mouth/Throat: Oropharynx is clear and moist.  Cardiovascular: Normal rate.   Pulmonary/Chest: Effort normal and breath sounds normal.  Abdominal: Soft.  Skin:       No bruising          Assessment & Plan:

## 2011-05-03 NOTE — Assessment & Plan Note (Signed)
Will hold coumadin for 2 days over the weekend. Will come in on Monday for recheck.  Hgb > 12 today, no signs of bleeding.  Instructed a family friend today to help assist with making sure coumadin is not administered.  Discussed plan with her PCP and Dr. Deirdre Priest

## 2011-05-03 NOTE — Patient Instructions (Signed)
Stop taking coumadin Come back for recheck on Monday

## 2011-05-06 ENCOUNTER — Ambulatory Visit (INDEPENDENT_AMBULATORY_CARE_PROVIDER_SITE_OTHER): Payer: Medicaid Other | Admitting: *Deleted

## 2011-05-06 DIAGNOSIS — Z86718 Personal history of other venous thrombosis and embolism: Secondary | ICD-10-CM

## 2011-05-06 DIAGNOSIS — Z7901 Long term (current) use of anticoagulants: Secondary | ICD-10-CM

## 2011-05-06 DIAGNOSIS — I2699 Other pulmonary embolism without acute cor pulmonale: Secondary | ICD-10-CM

## 2011-05-06 LAB — POCT INR: INR: 1.7

## 2011-05-07 ENCOUNTER — Encounter: Payer: Self-pay | Admitting: Family Medicine

## 2011-05-07 DIAGNOSIS — Z975 Presence of (intrauterine) contraceptive device: Secondary | ICD-10-CM | POA: Insufficient documentation

## 2011-05-07 HISTORY — DX: Presence of (intrauterine) contraceptive device: Z97.5

## 2011-05-10 ENCOUNTER — Ambulatory Visit (INDEPENDENT_AMBULATORY_CARE_PROVIDER_SITE_OTHER): Payer: Medicaid Other | Admitting: *Deleted

## 2011-05-10 ENCOUNTER — Ambulatory Visit (INDEPENDENT_AMBULATORY_CARE_PROVIDER_SITE_OTHER): Payer: Medicaid Other | Admitting: Sports Medicine

## 2011-05-10 ENCOUNTER — Encounter: Payer: Self-pay | Admitting: Sports Medicine

## 2011-05-10 VITALS — BP 126/89 | HR 119 | Temp 97.8°F | Wt 127.6 lb

## 2011-05-10 DIAGNOSIS — Z86718 Personal history of other venous thrombosis and embolism: Secondary | ICD-10-CM

## 2011-05-10 DIAGNOSIS — R110 Nausea: Secondary | ICD-10-CM | POA: Insufficient documentation

## 2011-05-10 DIAGNOSIS — Z7901 Long term (current) use of anticoagulants: Secondary | ICD-10-CM

## 2011-05-10 DIAGNOSIS — I2699 Other pulmonary embolism without acute cor pulmonale: Secondary | ICD-10-CM

## 2011-05-10 LAB — COMPREHENSIVE METABOLIC PANEL
ALT: 8 U/L (ref 0–35)
AST: 12 U/L (ref 0–37)
Alkaline Phosphatase: 65 U/L (ref 39–117)
CO2: 24 mEq/L (ref 19–32)
Creat: 0.75 mg/dL (ref 0.50–1.10)
Sodium: 142 mEq/L (ref 135–145)
Total Bilirubin: 0.3 mg/dL (ref 0.3–1.2)

## 2011-05-10 LAB — POCT INR: INR: 1.3

## 2011-05-10 MED ORDER — PROMETHAZINE HCL 25 MG/ML IJ SOLN
25.0000 mg | Freq: Once | INTRAMUSCULAR | Status: AC
  Administered 2011-05-10: 25 mg via INTRAMUSCULAR

## 2011-05-10 MED ORDER — ONDANSETRON 8 MG PO TBDP: 8.0000 mg | ORAL_TABLET | Freq: Three times a day (TID) | ORAL | Status: AC | PRN

## 2011-05-10 MED ORDER — LORATADINE 10 MG PO CAPS: 1.0000 | ORAL_CAPSULE | Freq: Every day | ORAL | Status: DC

## 2011-05-10 NOTE — Patient Instructions (Signed)
Great to see you, Shot of phenergan in office. Zofran ODT for nausea at home. Called in claritin for allergies. Checking some bloodwork. Come back to see me if no better in a week.  Ihor Austin. Benjamin Stain, M.D. [facility] [address] [place], [place] [address] [phone] Nausea Nausea is the uncomfortable feeling you get in your stomach before you vomit. There are many causes of nausea and vomiting. Common examples include:    Virus infections.   Food poisoning.   Medications.   Pregnancy.   Motion sickness.   Migraine headaches.   Emotional distress.   Severe pain from any source.   Alcohol intoxication.  Treatment is aimed at the relief of symptoms. If able, prevention of vomiting and dehydration works best.  Most virus infections and food poisoning symptoms will improve within 12-24 hours of onset. Some medications that cause nausea may be taken at bedtime. Avoid alcohol since this can irritate the stomach and make your symptoms worse. Get plenty of rest. Eat or drink small amounts of food and liquids more often. Medications for nausea, vomiting, and motion sickness are given orally, by suppository, or by injection. Call your caregiver if your condition is not improved within 2 days or your condition gets worse in a few hours. SEEK IMMEDIATE MEDICAL CARE IF:  You develop repeated vomiting, blood in the vomit, or dark or bloody stools   You develop severe chest or abdominal pain or headache   You develop fainting, extreme weakness, dehydration, or a high fever  Document Released: 12/26/2004 Document Re-Released: 02/14/2009 [facility] Patient Information 2011 [place], [place].

## 2011-05-10 NOTE — Assessment & Plan Note (Addendum)
Suspect viral illness. Pt with multiple medications, will check CMET. Phenergan in office. Zofran ODT PO at home (this is medicaid preferred). Also c/o some runny nose, will call in claritin per pt's request.

## 2011-05-10 NOTE — Assessment & Plan Note (Signed)
INR 1.3 today, 1.6 last week. COumadin was help 2/2 INR >8 in the recent past. Will cont current coumadin dosing 10mg  2d a week, 5 mg the rest. She will RTC next week and we can get a better idea of the trend at that point and adjust dose accordingly.

## 2011-05-10 NOTE — Progress Notes (Signed)
Addended by: Farrell Ours on: 05/10/2011 03:02 PM   Modules accepted: Orders

## 2011-05-10 NOTE — Progress Notes (Signed)
  Subjective:    Patient ID: [name] [name], female    DOB: Apr 19, 1976, 35 y.o.   MRN: [id number]  HPI Nausea for several days:  No abd pain, some runny nose, tickle in throat.  No vomiting.  Able to keep down orals, last ate this morning and kept down soup last night.  No SOB, CP, vag D/C, dysuria, fevers/chills, cough.  No rashes.  No changes in medications.  No new foods.  No diarrhea.  No sick contacts.  Review of Systems    See HPI Objective:   Physical Exam  Constitutional: She appears well-developed and well-nourished. No distress.  HENT:  Head: Normocephalic and atraumatic.  Mouth/Throat: Oropharynx is clear and moist. No oropharyngeal exudate.  Eyes: Pupils are equal, round, and reactive to light. Right eye exhibits no discharge. Left eye exhibits no discharge.  Neck: Neck supple. No JVD present. No tracheal deviation present. No thyromegaly present.  Cardiovascular: Normal rate, regular rhythm and normal heart sounds.  Exam reveals no gallop and no friction rub.   No murmur heard. Pulmonary/Chest: Effort normal. No respiratory distress. She has no wheezes. She has no rales. She exhibits no tenderness.  Abdominal: Soft. Bowel sounds are normal. She exhibits no distension and no mass. There is no tenderness. There is no rebound and no guarding.  Lymphadenopathy:    She has no cervical adenopathy.  Skin: Skin is warm and dry.          Assessment & Plan:

## 2011-05-11 ENCOUNTER — Encounter: Payer: Self-pay | Admitting: Sports Medicine

## 2011-05-15 ENCOUNTER — Encounter: Payer: Self-pay | Admitting: Family Medicine

## 2011-05-15 ENCOUNTER — Ambulatory Visit (INDEPENDENT_AMBULATORY_CARE_PROVIDER_SITE_OTHER): Payer: Medicaid Other | Admitting: *Deleted

## 2011-05-15 ENCOUNTER — Ambulatory Visit (INDEPENDENT_AMBULATORY_CARE_PROVIDER_SITE_OTHER): Payer: Medicaid Other | Admitting: Family Medicine

## 2011-05-15 DIAGNOSIS — I2699 Other pulmonary embolism without acute cor pulmonale: Secondary | ICD-10-CM

## 2011-05-15 DIAGNOSIS — G809 Cerebral palsy, unspecified: Secondary | ICD-10-CM

## 2011-05-15 DIAGNOSIS — R110 Nausea: Secondary | ICD-10-CM

## 2011-05-15 DIAGNOSIS — Z7901 Long term (current) use of anticoagulants: Secondary | ICD-10-CM

## 2011-05-15 DIAGNOSIS — Z975 Presence of (intrauterine) contraceptive device: Secondary | ICD-10-CM

## 2011-05-15 DIAGNOSIS — Z86718 Personal history of other venous thrombosis and embolism: Secondary | ICD-10-CM

## 2011-05-15 NOTE — Patient Instructions (Signed)
Please make an appointment for follow in one month to meet new doctor. Come back next week for INR check.

## 2011-05-15 NOTE — Progress Notes (Signed)
  Subjective:    Patient ID: [name] [name], female    DOB: [birth year], 35 y.o.   MRN: [id number]  HPI Pt is here for follow-up.  She was seen last week for nausea and rhinorrhea that was thought to be Viral symptoms/Allergic symptoms.  Pt was prescribed phenergan/zofran and claritin. She feels better today, but still having rhinorrhea, esp in AM.  She also has sore throat.  She denies fever/chills, cough, vomiting.    Recurrent PE: INR 1.5 today.  Will change coumadin dosing to 10mg  Tues, Thurs, Sat and 5mg  other days.  Pt does have intermittent dyspnea and chest tightness.  She has tried to stop using the supplement oxygen for about 1 month now. She has had 6 CTAs in the last 6 months to check for recurrent PE, which have been negative.  Her INR was supratherapeutic on 6/1 and coumadin was held for a couple of days.  We have since been readjusting her dose.    Cerebral palsy:  She has been complaining of dyspnea for several months.  We thought that this may be from deconditioning.  She was referred to Pulmonology and saw Dr [name] [name], who reviewed her CTs and thinks that deconditioning may be source.  She will follow up with Dr [name] [name] in a few months.  Since then she has had sessions with physical therapy and she has been given exercises to do at home.  She is walking around the house with a walker.  She is trying to use the wheelchair less.  She knows that it would be better for her to be able to get around by herself so that she when she lives alone she can be more independent.   Endometriosis/GYN:  Pt has been experiencing menstrual cramping for years.  At one time she was treated with OCPs.  After she developed PE, they were discontinued.  She was referred to GYN at [facility] who determined that she was a poor surgery candidate.  She was then referred to [facility] GYN.  At [facility] she had a [name] IUD inserted on 04/30/11.  Her abd cramping is improved.  Since the IUD insertion, she has been having  spotting.  She uses 1-2 pads per day.  She has a follow up appt on 05/25/11.   Home situation: She currently lives in a care-home with [name].  She does not like living with [name] because [name] [name] is bossy.  [name] [name] would like to live in her own apt with her daughter, [name] [name].  She hopes that they will be in their own apt by Dec 2012.  First she would like to get counseling for [name].  [name] [name] has been acting out and talking back to [name].  [name]'s case manager has gotten in touch with [name], who will provide counseling for [name].  Counseling will start in a couple of weeks.     Review of Systems Negative except in hpi    Objective:   Physical Exam GEN: nad, aox3, sitting in motorized wheelchair.   Mouth: no pharyngeal exudates or erythema Neck: no cervical nodes RESP: cta b/l        Assessment & Plan:

## 2011-05-16 NOTE — Assessment & Plan Note (Signed)
Improved.  She is doing better on Claritin, although still has rhinorrhea.  Will monitor.

## 2011-05-22 ENCOUNTER — Ambulatory Visit (INDEPENDENT_AMBULATORY_CARE_PROVIDER_SITE_OTHER): Payer: Medicaid Other | Admitting: *Deleted

## 2011-05-22 DIAGNOSIS — I2699 Other pulmonary embolism without acute cor pulmonale: Secondary | ICD-10-CM

## 2011-05-22 DIAGNOSIS — Z86718 Personal history of other venous thrombosis and embolism: Secondary | ICD-10-CM

## 2011-05-22 DIAGNOSIS — Z7901 Long term (current) use of anticoagulants: Secondary | ICD-10-CM

## 2011-05-23 ENCOUNTER — Ambulatory Visit: Payer: Medicaid Other | Admitting: Physical Therapy

## 2011-05-23 ENCOUNTER — Ambulatory Visit: Payer: Medicaid Other | Attending: Neurology | Admitting: Physical Therapy

## 2011-05-23 DIAGNOSIS — M2560 Stiffness of unspecified joint, not elsewhere classified: Secondary | ICD-10-CM | POA: Insufficient documentation

## 2011-05-23 DIAGNOSIS — R269 Unspecified abnormalities of gait and mobility: Secondary | ICD-10-CM | POA: Insufficient documentation

## 2011-05-23 DIAGNOSIS — M6281 Muscle weakness (generalized): Secondary | ICD-10-CM | POA: Insufficient documentation

## 2011-05-23 DIAGNOSIS — R5381 Other malaise: Secondary | ICD-10-CM | POA: Insufficient documentation

## 2011-05-23 DIAGNOSIS — IMO0001 Reserved for inherently not codable concepts without codable children: Secondary | ICD-10-CM | POA: Insufficient documentation

## 2011-05-29 ENCOUNTER — Ambulatory Visit (INDEPENDENT_AMBULATORY_CARE_PROVIDER_SITE_OTHER): Payer: Medicaid Other | Admitting: *Deleted

## 2011-05-29 DIAGNOSIS — Z7901 Long term (current) use of anticoagulants: Secondary | ICD-10-CM

## 2011-05-29 DIAGNOSIS — Z86718 Personal history of other venous thrombosis and embolism: Secondary | ICD-10-CM

## 2011-05-29 DIAGNOSIS — I2699 Other pulmonary embolism without acute cor pulmonale: Secondary | ICD-10-CM

## 2011-06-11 ENCOUNTER — Ambulatory Visit: Payer: Medicaid Other

## 2011-06-12 ENCOUNTER — Ambulatory Visit (INDEPENDENT_AMBULATORY_CARE_PROVIDER_SITE_OTHER): Payer: Medicaid Other | Admitting: *Deleted

## 2011-06-12 DIAGNOSIS — Z7901 Long term (current) use of anticoagulants: Secondary | ICD-10-CM

## 2011-06-12 DIAGNOSIS — Z86718 Personal history of other venous thrombosis and embolism: Secondary | ICD-10-CM

## 2011-06-12 DIAGNOSIS — I2699 Other pulmonary embolism without acute cor pulmonale: Secondary | ICD-10-CM

## 2011-06-12 LAB — POCT INR: INR: 2.2

## 2011-06-17 ENCOUNTER — Ambulatory Visit (INDEPENDENT_AMBULATORY_CARE_PROVIDER_SITE_OTHER): Payer: Medicaid Other | Admitting: Family Medicine

## 2011-06-17 ENCOUNTER — Encounter: Payer: Self-pay | Admitting: Family Medicine

## 2011-06-17 DIAGNOSIS — N949 Unspecified condition associated with female genital organs and menstrual cycle: Secondary | ICD-10-CM

## 2011-06-17 DIAGNOSIS — I10 Essential (primary) hypertension: Secondary | ICD-10-CM

## 2011-06-17 DIAGNOSIS — I2699 Other pulmonary embolism without acute cor pulmonale: Secondary | ICD-10-CM

## 2011-06-17 DIAGNOSIS — F339 Major depressive disorder, recurrent, unspecified: Secondary | ICD-10-CM

## 2011-06-17 DIAGNOSIS — G809 Cerebral palsy, unspecified: Secondary | ICD-10-CM

## 2011-06-17 NOTE — Progress Notes (Signed)
S: Pt comes in today to meet the new MD. No true complaints.  H/O MULTIPLE PE Has been keeping coumadin clinic appts.  Last INR therapeutic. No SOB, no edema or tenderness in legs.   HYPERTENSION BP: 106/73 Meds:norvasc Taking meds: yes     # of doses missed/week: 0 Symptoms: Headache: No Dizziness: No Vision changes: No SOB:  No Chest pain: No LE swelling: No Tobacco use: No   CP Stable, no new problems, contractures unchanged.  Has gotten some PT but would like more; feels like it helps.  Trying to be out of wheelchair more.  DYSFUNCTIONAL UTERINE BLEEDING Resolved s/p IUD insertion.  No vaginal bleeding or discharge.  No cramping.   ROS: Per HPI  History  Smoking status  . Never Smoker   Smokeless tobacco  . Never Used    O:  Filed Vitals:   06/17/11 1421  BP: 106/73  Pulse: 78    Gen: NAD CV: RRR, no murmur Pulm: CTA bilat, no wheezes or crackles Abd: soft, NT Ext: Warm, no chronic skin changes, no edema   A/P: 35 y.o. female presents for follow up -See problem list -f/u in 3 months

## 2011-06-18 ENCOUNTER — Encounter: Payer: Self-pay | Admitting: Family Medicine

## 2011-06-18 NOTE — Assessment & Plan Note (Signed)
Significantly improved s/p IUD placement.  Refilled pain meds; pt informed me of her agreement with Dr. Janalyn Harder on the # of pills she is given per month.

## 2011-06-18 NOTE — Assessment & Plan Note (Signed)
Spastic CP stable.  Pt trying to walk more and use wheelchair less.  Can only get 3 PT sessions in a year, per pt.  Will see if there is a way to increase the frequency and allotted amount of PT for pt as this will likely improve her quality of life.

## 2011-06-18 NOTE — Assessment & Plan Note (Signed)
Last INR therapeutic; next INR check 7/25.  Continue current Coumadin dosing. No recent bleeds, no SOB or s/s of recurrent PE.

## 2011-06-18 NOTE — Assessment & Plan Note (Addendum)
BP stable, 106 systolic, no s/s of hyper or hypotensive episodes.  Continue current txt.  F/u in 3 months for BP check.

## 2011-06-18 NOTE — Assessment & Plan Note (Signed)
Mood is ok, no SI/HI. Continue current txt.

## 2011-06-26 ENCOUNTER — Ambulatory Visit (INDEPENDENT_AMBULATORY_CARE_PROVIDER_SITE_OTHER): Payer: Medicaid Other | Admitting: *Deleted

## 2011-06-26 DIAGNOSIS — Z7901 Long term (current) use of anticoagulants: Secondary | ICD-10-CM

## 2011-06-26 DIAGNOSIS — Z86718 Personal history of other venous thrombosis and embolism: Secondary | ICD-10-CM

## 2011-06-26 DIAGNOSIS — I2699 Other pulmonary embolism without acute cor pulmonale: Secondary | ICD-10-CM

## 2011-06-26 LAB — POCT INR: INR: 3

## 2011-07-08 ENCOUNTER — Other Ambulatory Visit: Payer: Self-pay | Admitting: Family Medicine

## 2011-07-08 ENCOUNTER — Ambulatory Visit (INDEPENDENT_AMBULATORY_CARE_PROVIDER_SITE_OTHER): Payer: Medicaid Other | Admitting: *Deleted

## 2011-07-08 DIAGNOSIS — Z86718 Personal history of other venous thrombosis and embolism: Secondary | ICD-10-CM

## 2011-07-08 DIAGNOSIS — I2699 Other pulmonary embolism without acute cor pulmonale: Secondary | ICD-10-CM

## 2011-07-08 DIAGNOSIS — Z7901 Long term (current) use of anticoagulants: Secondary | ICD-10-CM

## 2011-07-08 LAB — POCT INR: INR: 1.2

## 2011-07-08 MED ORDER — WARFARIN SODIUM 7.5 MG PO TABS: 7.5000 mg | ORAL_TABLET | Freq: Every day | ORAL | Status: DC

## 2011-07-08 NOTE — Telephone Encounter (Signed)
Spoke with patient and it unclear how she is taking coumadin  exactly from her report. Unable to understand over the phone  how she has been  getting coumadin either 5 mg or 10 mg tabs  Was scheduled to come in tomorrow for PT/ INR .   Will come in today  instead.

## 2011-07-08 NOTE — Telephone Encounter (Signed)
Not sure which coumadin patient needs 5 mg or 10 mg. Pharmacy not sure which she needs either. Looks like she may have been getting both. Called and left message  for patient to call back

## 2011-07-08 NOTE — Telephone Encounter (Signed)
Pt is out of her coumadin and needs refill today- called to [facility].

## 2011-07-09 ENCOUNTER — Ambulatory Visit: Payer: Medicaid Other

## 2011-07-09 NOTE — Telephone Encounter (Signed)
Patient in for PT/ INR . It is unclear how she has recently been taking coumadin. Spoke with French Ana the person patient lives with. French Ana prepares her pill box each week. States she has been taking Coumadin 10 mg one day alternating with 5 mg the next. She states [place] has never given her a calender  with current instructions.  However patient states she always gives her the calender. Checked with pharmacy and was told patient received #45  Coumadin 10 mg on 03/26/2011 and she has not  gotten any since.   Consulted with Dr. [place] and it is decided to take 7.5 mg daily.  recheck in one week. Patient will also see Dr. Fara Boros at that visit. Dr. [place] sent in RX.

## 2011-07-10 ENCOUNTER — Ambulatory Visit: Payer: Medicaid Other

## 2011-07-17 ENCOUNTER — Inpatient Hospital Stay (HOSPITAL_COMMUNITY)
Admission: EM | Admit: 2011-07-17 | Discharge: 2011-07-24 | DRG: 176 | Disposition: A | Discharge status: Home / Self Care | Payer: Medicaid Other | Attending: Family Medicine | Admitting: Family Medicine

## 2011-07-17 ENCOUNTER — Encounter: Payer: Self-pay | Admitting: Family Medicine

## 2011-07-17 ENCOUNTER — Emergency Department (HOSPITAL_COMMUNITY): Payer: Medicaid Other

## 2011-07-17 ENCOUNTER — Encounter (HOSPITAL_COMMUNITY): Payer: Self-pay

## 2011-07-17 DIAGNOSIS — F341 Dysthymic disorder: Secondary | ICD-10-CM | POA: Diagnosis present

## 2011-07-17 DIAGNOSIS — Z7901 Long term (current) use of anticoagulants: Secondary | ICD-10-CM

## 2011-07-17 DIAGNOSIS — R55 Syncope and collapse: Secondary | ICD-10-CM | POA: Diagnosis present

## 2011-07-17 DIAGNOSIS — M2450 Contracture, unspecified joint: Secondary | ICD-10-CM | POA: Diagnosis present

## 2011-07-17 DIAGNOSIS — I10 Essential (primary) hypertension: Secondary | ICD-10-CM | POA: Diagnosis present

## 2011-07-17 DIAGNOSIS — N760 Acute vaginitis: Secondary | ICD-10-CM

## 2011-07-17 DIAGNOSIS — N898 Other specified noninflammatory disorders of vagina: Secondary | ICD-10-CM | POA: Diagnosis present

## 2011-07-17 DIAGNOSIS — G43909 Migraine, unspecified, not intractable, without status migrainosus: Secondary | ICD-10-CM | POA: Diagnosis present

## 2011-07-17 DIAGNOSIS — K219 Gastro-esophageal reflux disease without esophagitis: Secondary | ICD-10-CM | POA: Diagnosis present

## 2011-07-17 DIAGNOSIS — I2699 Other pulmonary embolism without acute cor pulmonale: Principal | ICD-10-CM | POA: Diagnosis present

## 2011-07-17 DIAGNOSIS — F429 Obsessive-compulsive disorder, unspecified: Secondary | ICD-10-CM | POA: Diagnosis present

## 2011-07-17 DIAGNOSIS — M1990 Unspecified osteoarthritis, unspecified site: Secondary | ICD-10-CM | POA: Diagnosis present

## 2011-07-17 DIAGNOSIS — G808 Other cerebral palsy: Secondary | ICD-10-CM

## 2011-07-17 DIAGNOSIS — G809 Cerebral palsy, unspecified: Secondary | ICD-10-CM | POA: Diagnosis present

## 2011-07-17 LAB — URINALYSIS, ROUTINE W REFLEX MICROSCOPIC
Glucose, UA: NEGATIVE mg/dL
Hgb urine dipstick: NEGATIVE
Ketones, ur: >80 mg/dL — AB
Leukocytes, UA: NEGATIVE
Nitrite: NEGATIVE
Protein, ur: NEGATIVE mg/dL
Specific Gravity, Urine: 1.026 (ref 1.005–1.030)
Urobilinogen, UA: 1 mg/dL (ref 0.0–1.0)
pH: 6 (ref 5.0–8.0)

## 2011-07-17 LAB — CBC
HCT: 40.5 % (ref 36.0–46.0)
MCHC: 33.3 g/dL (ref 30.0–36.0)
MCV: 84 fL (ref 78.0–100.0)
RDW: 14.8 % (ref 11.5–15.5)

## 2011-07-17 LAB — POCT I-STAT TROPONIN I
Troponin i, poc: 0 ng/mL (ref 0.00–0.08)
Troponin i, poc: 0 ng/mL (ref 0.00–0.08)

## 2011-07-17 LAB — CK TOTAL AND CKMB (NOT AT ARMC)
CK, MB: 3.1 ng/mL (ref 0.3–4.0)
Relative Index: INVALID (ref 0.0–2.5)

## 2011-07-17 LAB — DIFFERENTIAL
Basophils Absolute: 0 10*3/uL (ref 0.0–0.1)
Eosinophils Absolute: 0.1 10*3/uL (ref 0.0–0.7)
Eosinophils Relative: 1 % (ref 0–5)
Lymphocytes Relative: 34 % (ref 12–46)
Lymphs Abs: 2.2 10*3/uL (ref 0.7–4.0)
Monocytes Absolute: 0.6 10*3/uL (ref 0.1–1.0)

## 2011-07-17 LAB — TROPONIN I: Troponin I: <0.3 ng/mL (ref <0.30)

## 2011-07-17 LAB — POCT I-STAT, CHEM 8
BUN: 14 mg/dL (ref 6–23)
Calcium, Ion: 1.17 mmol/L (ref 1.12–1.32)
Chloride: 110 mEq/L (ref 96–112)
Glucose, Bld: 85 mg/dL (ref 70–99)

## 2011-07-17 LAB — GLUCOSE, CAPILLARY

## 2011-07-17 MED ORDER — IOHEXOL 300 MG/ML  SOLN
100.0000 mL | Freq: Once | INTRAMUSCULAR | Status: AC | PRN
  Administered 2011-07-17: 100 mL via INTRAVENOUS

## 2011-07-17 MED ORDER — IOHEXOL 300 MG/ML  SOLN: 100.0000 mL | Freq: Once | INTRAMUSCULAR | Status: DC | PRN

## 2011-07-17 NOTE — H&P (Signed)
Family Medicine Teaching [facility] Admission History and Physical  Patient name: [name] [name] Medical record number: [id number] Date of birth: 04-30-1976 Age: 35 y.o. Gender: female  Primary Care Provider: Demetria Pore, MD  Chief Complaint: Syncope History of Present Illness: [name] [name] is a 35 y.o. year old female presenting with syncope.  [name] [name] was at a the bus stop today in her wheelchair when she fell to the side and lost conciseness. She notes that she has been fatigued and weak for the last few weeks. Prior to passing out [name] [name] noted palpitations but denied chest pains. She also notes reduced po intake. She also denies any fevers or chills.   Patient Active Problem List  Diagnoses  . DEPRESSION, MAJOR, RECURRENT  . OBSESSIVE COMPUL. DISORDER  . CEREBRAL PALSY  . HYPERTENSION, BENIGN  . DYSFUNCTIONAL UTERINE BLEEDING  . Contracture of joint of multiple sites  . WRIST PAIN, LEFT  . Encounter for long-term (current) use of anticoagulants  . Seborrheic keratosis  . Dyspnea  . Urinary retention  . Recurrent pulmonary embolism  . IUD (intrauterine device) in place  . Nausea   Past Medical History: Past Medical History  Diagnosis Date  . Cerebral palsy   . Pulmonary embolism     Lifetime Coumadin  . Cerebral palsy     Spastic Cerebral palsy, mentally intact  . Contracture, joint, multiple sites     Electric wheelchair, uses left hand to operate chair.   [name] Kitchen Hypertension   . OCD (obsessive compulsive disorder)   . Depression   . Migraine   . [name]   . Dysarthria   . Endometriosis   . History of recurrent UTIs   . GERD (gastroesophageal reflux disease)   . Pulmonary embolism 2011, 01/2011    Will be on lifetime coumadin    Past Surgical History: Past Surgical History  Procedure Date  . Colposcopy 06/2000  . Cesarean section   . Tubal ligation 2003  . Carpal tunnel release 08/2008    Dr Teressa Senter  . Wrist surgery 06/2010      Dr Dierdre Searles, hand surgeon, [name]  . Baclofen pump refill     x 3 times  . Appendectomy   . Intrauterine device insertion 04/30/11    Inserted by [facility] for endometriosis    Social History: History   Social History  . Marital Status: Divorced    Spouse Name: N/A    Number of Children: 2  . Years of Education: N/A   Occupational History  .     Social History Main Topics  . Smoking status: Never Smoker   . Smokeless tobacco: Never Used  . Alcohol Use: No  . Drug Use: No  . Sexually Active: No   Other Topics Concern  . Not on file   Social History Narrative   Pt lives in the home of a caregiver.  Lives there with her daughter and 7 other people.  Caregiver accompanies [name] to appt.  [name] uses [employer] transport [employer]. Ailene Ards, who has schizophrenia, MR, is father of daughter.  Homero Fellers and pt are no longer together.  Homero Fellers does not see [name]. Daughter is 49 yo, Stage manager, lives with pt.  [name] [phone]), lives with pt's parents.Needs assistance with ADL's and IADL's--has personal care services 20 hrs/wk; No tobacco, EtOH, drugs.**Patient very concerned about her kids being taken from her.**Case Manager: Jack Quarto [phone]    Family History: Family History  Problem Relation Age of Onset  .  Asthma Father   . Cancer Maternal Grandmother   . Cancer Paternal Grandmother     Allergies: No Known Allergies  No current facility-administered medications for this visit.   Current Outpatient Prescriptions  Medication Sig Dispense Refill  . albuterol (PROVENTIL) (2.5 MG/3ML) 0.083% nebulizer solution 2.5mg -5mg  inhaled by nebulizer every 4 hours as needed for shortness of breath. Dispense 1.       . amLODipine (NORVASC) 5 MG tablet Take 5 mg by mouth daily.        . ARIPiprazole (ABILIFY) 2 MG tablet Take 2 mg by mouth daily. Per Dr Gwyndolyn Kaufman       . baclofen (LIORESAL) 10 MG tablet Take 10 mg by mouth 4 (four) times daily.       . clonazePAM (KLONOPIN) 1 MG tablet 1 mg  at bedtime as needed. On tablet in Am, 2 tablets qhs      . HYDROcodone-acetaminophen (NORCO) 5-325 MG per tablet Take 1 tablet by mouth every 4 (four) hours as needed for pain.  30 tablet  5  . lamoTRIgine (LAMICTAL) 150 MG tablet 150 mg daily. Per Dr Gwyndolyn Kaufman      . levonorgestrel ([name]) 20 MCG/24HR IUD 1 each by Intrauterine route once.        . Loratadine 10 MG CAPS Take 1 capsule (10 mg total) by mouth daily.  30 each  6  . oxybutynin (DITROPAN) 5 MG tablet Take 5 mg by mouth 3 (three) times daily. For overactive bladder.       . polyethylene glycol (MIRALAX) powder Take 17 g by mouth as needed.  255 g  6  . QUEtiapine (SEROQUEL) 200 MG tablet Take 200 mg by mouth at bedtime.        [name] Kitchen Respiratory Therapy Supplies (NEBULIZER/ADULT MASK) KIT Use as directed with Neb machine. Dispense 1       . Spacer/Aero-Holding Rudean Curt To use with MDI       . SUMAtriptan (IMITREX) 20 MG/ACT nasal spray 1 spray by Nasal route every 2 (two) hours as needed.        . tizanidine (ZANAFLEX) 2 MG capsule 2 (two) times daily. 2 mg in am, 4 mg in pm      . topiramate (TOPAMAX) 100 MG tablet Take 100 mg by mouth 2 (two) times daily.        [name] Kitchen venlafaxine (EFFEXOR-XR) 150 MG 24 hr capsule Take 300 mg by mouth.        . warfarin (COUMADIN) 7.5 MG tablet Take 1 tablet (7.5 mg total) by mouth daily.  30 tablet  0   Facility-Administered Medications Ordered in Other Visits  Medication Dose Route Frequency Provider Last Rate Last Dose  . iohexol (OMNIPAQUE) 300 MG/ML injection 100 mL  100 mL Intravenous Once PRN Medication Radiologist   100 mL at 07/17/11 1956  . DISCONTD: iohexol (OMNIPAQUE) 300 MG/ML injection 100 mL  100 mL Intravenous Once PRN Medication Radiologist       Review Of Systems: Per HPI  Otherwise 12 point review of systems was performed and was unremarkable.  Physical Exam: Pulse: 86-124  Blood Pressure: 117/73 RR: 16   O2: 97 on ra Temp: 96.9  General: alert, cooperative and no distress HEENT:  sclera clear, anicteric and neck supple with midline trachea Heart: S1, S2 normal, no murmur, rub or gallop, regular rate and rhythm Lungs: clear to auscultation, no wheezes or rales and unlabored breathing Abdomen: abdomen is soft without significant tenderness, masses, organomegaly or guarding  Extremities: extremities normal, atraumatic, no cyanosis or edema and Flexion contractures Skin:no rashes, no ecchymoses Neurology: Unchanged. Slow and deliberate speech. Flexion contractures.   Labs and Imaging: Lab Results  Component Value Date/Time   NA 143 07/17/2011  6:00 PM   K 3.4* 07/17/2011  6:00 PM   CL 110 07/17/2011  6:00 PM   CO2 24 05/10/2011 12:18 PM   BUN 14 07/17/2011  6:00 PM   CREATININE 0.80 07/17/2011  6:00 PM   CREATININE 0.75 05/10/2011 12:18 PM   GLUCOSE 85 07/17/2011  6:00 PM   Lab Results  Component Value Date   WBC 6.7 07/17/2011   HGB 14.6 07/17/2011   HCT 43.0 07/17/2011   MCV 84.0 07/17/2011   PLT 260 07/17/2011   Urine Analysis showing Ketones, otherwise bland.   CT Angiogram chest: IMPRESSION:  1. Positive for pulmonary emboli. Two emboli are seen, one in a  subsegmental branch of the left lower lobe and one in a  subsegmental branch of the the right lower lobe. Critical test  results were discussed with Dr. Patrica Duel by telephone 07/17/2011 at  8:30 p.m.  2. Scattered areas of atelectasis.  Head CT: Findings: There is no evidence for acute infarction, intracranial  hemorrhage, mass lesion, hydrocephalus, or extra-axial fluid.  There is no significant atrophy or white matter disease. Calvarium  is intact. No sinus or mastoid fluid.  Troponin: Negative  Lab Results  Component Value Date   INR 2.44* 07/17/2011   INR 1.2 07/08/2011   INR 3.0 06/26/2011      Assessment and Plan: [name] [name] is a 35 y.o. year old female presenting with Syncope 1. Syncope: Associated with palpitations and PE on CT scan. I certainly expect that her syncope may be cardiogenic. I am  not sure if her PE is new or is several weeks old. PE discussed below. Seizure is a possibility however [name] [name] has been evaluated for seizure disorder by Dr. Sharene Skeans and has been found to not have true seizures. Additionally I think metabolic or infectious causes are less likely as labs values were relatively normal on admission.  Plan: Watch on telemetry overnight while cycling cardiac enzymes. May get ECHO in the morning to get an assessment of right heart strain to support the presumptive diagnosis of new PE causing reduced cardiac output and syncope. Will follow.  2. PE: Diagnosed on CT scan today. This is either a new PE or evidence of old PE. She is currently therapeutic with her INR but 1 week ago was sub-therapeutic Additionally she has not been feeling well for weeks. The timing of PE will make a difference for the treatment plan. If PE is new and happened while therapeutic on warfarin she may need life long anticoagulation with lovenox. If not then we can safely continue warfarin.  Will continue warfarin tonight and discuss in the morning.  3.Ceberal Palsy: Stable. Plan to continue home medications. 4. HTN: On home medications and is currently controlled. Plan to continue and monitor.  5. Psych: At baseline. Will continue home medications and follow.  6. FEN/GI: IVF at 160ml/hr and normal diet.  7. Prophylaxis: warfarin 8. Disposition: When work up complete.   [id number]

## 2011-07-18 ENCOUNTER — Ambulatory Visit: Payer: Medicaid Other | Admitting: Family Medicine

## 2011-07-18 ENCOUNTER — Ambulatory Visit: Payer: Medicaid Other

## 2011-07-18 DIAGNOSIS — I2699 Other pulmonary embolism without acute cor pulmonale: Secondary | ICD-10-CM

## 2011-07-18 DIAGNOSIS — R55 Syncope and collapse: Secondary | ICD-10-CM

## 2011-07-18 DIAGNOSIS — R0902 Hypoxemia: Secondary | ICD-10-CM

## 2011-07-18 LAB — CARDIAC PANEL(CRET KIN+CKTOT+MB+TROPI)
Relative Index: INVALID (ref 0.0–2.5)
Total CK: 50 U/L (ref 7–177)
Troponin I: <0.3 ng/mL (ref <0.30)

## 2011-07-18 LAB — CBC
Hemoglobin: 11.6 g/dL — ABNORMAL LOW (ref 12.0–15.0)
MCH: 28.4 pg (ref 26.0–34.0)
MCV: 84.1 fL (ref 78.0–100.0)
Platelets: 240 10*3/uL (ref 150–400)
RBC: 4.09 MIL/uL (ref 3.87–5.11)
WBC: 5.9 10*3/uL (ref 4.0–10.5)

## 2011-07-18 LAB — BASIC METABOLIC PANEL
Calcium: 8.5 mg/dL (ref 8.4–10.5)
Creatinine, Ser: 0.53 mg/dL (ref 0.50–1.10)
GFR calc Af Amer: >60 mL/min (ref >60)
Sodium: 143 mEq/L (ref 135–145)

## 2011-07-19 ENCOUNTER — Inpatient Hospital Stay (HOSPITAL_COMMUNITY): Payer: Medicaid Other

## 2011-07-19 LAB — CARDIAC PANEL(CRET KIN+CKTOT+MB+TROPI)
CK, MB: 6.8 ng/mL (ref 0.3–4.0)
Relative Index: 1.9 (ref 0.0–2.5)
Total CK: 314 U/L — ABNORMAL HIGH (ref 7–177)
Total CK: 277 U/L — ABNORMAL HIGH (ref 7–177)

## 2011-07-19 LAB — BASIC METABOLIC PANEL
Calcium: 8.4 mg/dL (ref 8.4–10.5)
Glucose, Bld: 80 mg/dL (ref 70–99)
Potassium: 3.6 mEq/L (ref 3.5–5.1)
Sodium: 142 mEq/L (ref 135–145)

## 2011-07-19 LAB — PROTIME-INR
INR: 2.78 — ABNORMAL HIGH (ref 0.00–1.49)
Prothrombin Time: 29.8 seconds — ABNORMAL HIGH (ref 11.6–15.2)

## 2011-07-19 LAB — CBC
Platelets: 225 10*3/uL (ref 150–400)
RBC: 4.24 MIL/uL (ref 3.87–5.11)
RDW: 15.4 % (ref 11.5–15.5)
WBC: 6.1 10*3/uL (ref 4.0–10.5)

## 2011-07-20 ENCOUNTER — Other Ambulatory Visit (HOSPITAL_COMMUNITY): Payer: Medicaid Other

## 2011-07-20 ENCOUNTER — Inpatient Hospital Stay (HOSPITAL_COMMUNITY): Payer: Medicaid Other

## 2011-07-20 DIAGNOSIS — G809 Cerebral palsy, unspecified: Secondary | ICD-10-CM

## 2011-07-20 LAB — PROTIME-INR
INR: 3.03 — ABNORMAL HIGH (ref 0.00–1.49)
Prothrombin Time: 31.9 seconds — ABNORMAL HIGH (ref 11.6–15.2)

## 2011-07-20 LAB — WET PREP, GENITAL: Trich, Wet Prep: NONE SEEN

## 2011-07-20 LAB — CARDIAC PANEL(CRET KIN+CKTOT+MB+TROPI): CK, MB: 4.1 ng/mL — ABNORMAL HIGH (ref 0.3–4.0)

## 2011-07-20 MED ORDER — IOHEXOL 350 MG/ML SOLN
50.0000 mL | Freq: Once | INTRAVENOUS | Status: AC | PRN
  Administered 2011-07-20: 50 mL via INTRAVENOUS

## 2011-07-21 LAB — PROTIME-INR
INR: 2.5 — ABNORMAL HIGH (ref 0.00–1.49)
Prothrombin Time: 27.4 seconds — ABNORMAL HIGH (ref 11.6–15.2)

## 2011-07-22 LAB — CBC
MCH: 27.6 pg (ref 26.0–34.0)
Platelets: 257 10*3/uL (ref 150–400)
RBC: 4.16 MIL/uL (ref 3.87–5.11)
RDW: 14.8 % (ref 11.5–15.5)

## 2011-07-22 LAB — GC/CHLAMYDIA PROBE AMP, GENITAL: GC Probe Amp, Genital: NEGATIVE

## 2011-07-22 LAB — PROTIME-INR: Prothrombin Time: 24.9 seconds — ABNORMAL HIGH (ref 11.6–15.2)

## 2011-07-23 ENCOUNTER — Inpatient Hospital Stay (HOSPITAL_COMMUNITY): Payer: Medicaid Other

## 2011-07-23 DIAGNOSIS — G809 Cerebral palsy, unspecified: Secondary | ICD-10-CM

## 2011-07-23 DIAGNOSIS — I2699 Other pulmonary embolism without acute cor pulmonale: Secondary | ICD-10-CM

## 2011-07-23 DIAGNOSIS — R0902 Hypoxemia: Secondary | ICD-10-CM

## 2011-07-23 LAB — BASIC METABOLIC PANEL
BUN: 12 mg/dL (ref 6–23)
Calcium: 9 mg/dL (ref 8.4–10.5)
Creatinine, Ser: 0.51 mg/dL (ref 0.50–1.10)
GFR calc Af Amer: >60 mL/min (ref >60)
GFR calc non Af Amer: >60 mL/min (ref >60)

## 2011-07-23 LAB — CBC
MCH: 27.4 pg (ref 26.0–34.0)
MCHC: 31.8 g/dL (ref 30.0–36.0)
Platelets: 264 10*3/uL (ref 150–400)
RBC: 4.13 MIL/uL (ref 3.87–5.11)
RDW: 14.7 % (ref 11.5–15.5)

## 2011-07-23 LAB — PROTIME-INR: Prothrombin Time: 23.3 seconds — ABNORMAL HIGH (ref 11.6–15.2)

## 2011-07-24 LAB — BASIC METABOLIC PANEL
BUN: 13 mg/dL (ref 6–23)
CO2: 28 mEq/L (ref 19–32)
Calcium: 9.1 mg/dL (ref 8.4–10.5)
GFR calc non Af Amer: >60 mL/min (ref >60)
Glucose, Bld: 93 mg/dL (ref 70–99)

## 2011-07-24 NOTE — Procedures (Signed)
EEG NUMBER:  REFERRING PHYSICIAN:  [facility]  HISTORY:  A 35 year old female with syncope, evaluated to rule out seizures.  MEDICATIONS:  Coumadin, Lioresal, Ditropan, Norvasc, Claritin, Zoloft, Lamictal, Topamax, Effexor, and Abilify.  CONDITIONS OF RECORDING:  This is a 16-channel EEG, carried out with the patient in the awake, drowsy, and asleep states.  DESCRIPTION:  The waking background activity consists of a low-voltage symmetrical, fairly well-organized 8 Hz alpha activity seen from the parieto-occipital and posterotemporal regions.  Low-voltage fast activity poorly organized is seen anteriorly at times, superimposed on more posterior rhythms.  A mixture of theta and alpha rhythm was seen from the central and temporal regions.  The patient drowses with slowing to irregular, low-voltage theta and beta activity.  The patient goes into a light sleep with symmetrical sleep spindles, vertex with a sharp activity and irregular slow activity.  Hypoventilation was not performed.  Intermittent photic stimulation elicits a symmetrical driving response, but failed to elicit any abnormalities.  IMPRESSION:  This is a normal EEG.          ______________________________ Thana Farr, MD    YN:WGNF D:  07/23/2011 17:56:47  T:  07/23/2011 22:13:26  Job #:  [id number]

## 2011-07-25 ENCOUNTER — Encounter: Payer: Self-pay | Admitting: Family Medicine

## 2011-07-25 ENCOUNTER — Inpatient Hospital Stay (HOSPITAL_COMMUNITY)
Admission: EM | Admit: 2011-07-25 | Discharge: 2011-07-29 | DRG: 690 | Disposition: A | Discharge status: Skilled Nursing Facility | Payer: Medicaid Other | Source: Ambulatory Visit | Attending: Family Medicine | Admitting: Family Medicine

## 2011-07-25 ENCOUNTER — Ambulatory Visit: Payer: Medicaid Other

## 2011-07-25 ENCOUNTER — Telehealth: Payer: Self-pay | Admitting: Family Medicine

## 2011-07-25 DIAGNOSIS — F3289 Other specified depressive episodes: Secondary | ICD-10-CM | POA: Diagnosis present

## 2011-07-25 DIAGNOSIS — F329 Major depressive disorder, single episode, unspecified: Secondary | ICD-10-CM | POA: Diagnosis present

## 2011-07-25 DIAGNOSIS — G809 Cerebral palsy, unspecified: Secondary | ICD-10-CM | POA: Diagnosis present

## 2011-07-25 DIAGNOSIS — Z7901 Long term (current) use of anticoagulants: Secondary | ICD-10-CM

## 2011-07-25 DIAGNOSIS — R109 Unspecified abdominal pain: Secondary | ICD-10-CM | POA: Diagnosis present

## 2011-07-25 DIAGNOSIS — K219 Gastro-esophageal reflux disease without esophagitis: Secondary | ICD-10-CM | POA: Diagnosis present

## 2011-07-25 DIAGNOSIS — G43909 Migraine, unspecified, not intractable, without status migrainosus: Secondary | ICD-10-CM | POA: Diagnosis present

## 2011-07-25 DIAGNOSIS — N390 Urinary tract infection, site not specified: Principal | ICD-10-CM | POA: Diagnosis present

## 2011-07-25 DIAGNOSIS — M1990 Unspecified osteoarthritis, unspecified site: Secondary | ICD-10-CM | POA: Diagnosis present

## 2011-07-25 DIAGNOSIS — I2782 Chronic pulmonary embolism: Secondary | ICD-10-CM | POA: Diagnosis present

## 2011-07-25 DIAGNOSIS — N319 Neuromuscular dysfunction of bladder, unspecified: Secondary | ICD-10-CM | POA: Diagnosis present

## 2011-07-25 DIAGNOSIS — K5900 Constipation, unspecified: Secondary | ICD-10-CM | POA: Diagnosis present

## 2011-07-25 NOTE — Telephone Encounter (Signed)
Called and left a message for the patient to call back to schedule an appt

## 2011-07-25 NOTE — Telephone Encounter (Signed)
[name] [name] is calling because she thinks she has a UTI.  She is not able to come in today, but she does have an appt with [facility] tomorrow.  She was hoping that something could be called in for her.  Please give her a call.

## 2011-07-25 NOTE — H&P (Signed)
Family Medicine Teaching [facility] Admission History and Physical  Patient name: [name] [name] Medical record number: [id number] Date of birth: [birth year] Age: 35 y.o. Gender: female  Primary Care Provider: Demetria Pore, MD  Chief Complaint: abdominal pain History of Present Illness: [name] [name] is a 35 y.o. year old female with cerebral palsy who was discharged from hospital yesterday for syncope who presented today with abdominal pian and dysuria for one day. She denies any fevers or chills or any lower back pain. She has had lower appetite, but denies any nausea or vomiting. She came to the ED where she was diagnosed with a UTI. She was going to be discharged home but she lives alone at home and no family member could care for her. She reports feeling weak and having trouble taking care of herself.   Patient Active Problem List  Diagnoses  . DEPRESSION, MAJOR, RECURRENT  . OBSESSIVE COMPUL. DISORDER  . CEREBRAL PALSY  . HYPERTENSION, BENIGN  . DYSFUNCTIONAL UTERINE BLEEDING  . Contracture of joint of multiple sites  . WRIST PAIN, LEFT  . Encounter for long-term (current) use of anticoagulants  . Seborrheic keratosis  . Dyspnea  . Urinary retention  . Recurrent pulmonary embolism  . IUD (intrauterine device) in place  . Nausea  - cerebral palsy  Past Medical History: Past Medical History  Diagnosis Date  . Cerebral palsy   . Pulmonary embolism     Lifetime Coumadin  . Cerebral palsy     Spastic Cerebral palsy, mentally intact  . Contracture, joint, multiple sites     Electric wheelchair, uses left hand to operate chair.   [name] Kitchen Hypertension   . OCD (obsessive compulsive disorder)   . Depression   . Migraine   . [name]   . Dysarthria   . Endometriosis   . History of recurrent UTIs   . GERD (gastroesophageal reflux disease)   . Pulmonary embolism 2011, 01/2011    Will be on lifetime coumadin    Past Surgical History: Past Surgical  History  Procedure Date  . Colposcopy 06/2000  . Cesarean section   . Tubal ligation 2003  . Carpal tunnel release 08/2008    Dr Teressa Senter  . Wrist surgery 06/2010    Dr Dierdre Searles, hand surgeon, [name]  . Baclofen pump refill     x 3 times  . Appendectomy   . Intrauterine device insertion 04/30/11    Inserted by [facility] for endometriosis    Social History: History   Social History  . Marital Status: Divorced    Spouse Name: N/A    Number of Children: 2  . Years of Education: N/A   Occupational History  .     Social History Main Topics  . Smoking status: Never Smoker   . Smokeless tobacco: Never Used  . Alcohol Use: No  . Drug Use: No  . Sexually Active: No   Other Topics Concern  . Not on file   Social History Narrative   Pt lives in the home of a caregiver.  Lives there with her daughter and 7 other people.  Caregiver accompanies [name] to appt.  [name] uses [employer] transport [employer]. Ailene Ards, who has schizophrenia, MR, is father of daughter.  Homero Fellers and pt are no longer together.  Homero Fellers does not see [name]. Daughter is 46 yo, Stage manager, lives with pt.  [name] [phone]), lives with pt's parents.Needs assistance with ADL's and IADL's--has personal care services 20 hrs/wk; No tobacco, EtOH, drugs.**Patient  very concerned about her kids being taken from her.**Case Manager: Jack Quarto [phone]    Family History: Family History  Problem Relation Age of Onset  . Asthma Father   . Cancer Maternal Grandmother   . Cancer Paternal Grandmother     Allergies: No Known Allergies  Meds on discharge from hospital:  Albuterol 2.5mg 3ml Amlodipine 5mg  po qd Abilify 5mg  op qd oxybutinin 5mg  q8 topiramate 25mg  po qhs Baclofen 10mg  qid Benzonatate 100mg  q8/prn Hydrocodone/APAP q6/prn imitrex 100mg  daily/prn Klonopin 1mg  four times daily as needed lamictal 200mg  qd Loratadine 10mg  daily miralax Ondansetron 8mg  q8/prn Sertraline 100mg  2tabs qd Tizanidine 2mg  2tabs  qd Tramadol 50mg  q6 Warfarin 7.5mg  qd Review Of Systems: Per HPI with the following additions: none Otherwise 12 point review of systems was performed and was unremarkable.  Physical Exam: Pulse: 91  Blood Pressure: 112/83 RR: 16   O2: 96% on RA Temp: 97.5  General: alert and tearful  HEENT: PERRLA and extra ocular movement intact Heart: S1, S2 normal, no murmur, rub or gallop, regular rate and rhythm Lungs: clear to auscultation, no wheezes or rales and unlabored breathing Abdomen: tenderness to palpation suprapubic area. No guarding or rebound. Negative CVA tenderness Extremities: no edema, contracted Skin:no rashes Neurology: PERRLA, contractures  Labs and Imaging: Lab Results  Component Value Date/Time   NA 138 07/25/2011  4:30 PM   K 3.3* 07/25/2011  4:30 PM   CL 104 07/25/2011  4:30 PM   CO2 24 07/25/2011  4:30 PM   BUN 20 07/25/2011  4:30 PM   CREATININE 0.48* 07/25/2011  4:30 PM   CREATININE 0.75 05/10/2011 12:18 PM   GLUCOSE 90 07/25/2011  4:30 PM   Lab Results  Component Value Date   WBC 8.9 07/25/2011   HGB 12.1 07/25/2011   HCT 36.5 07/25/2011   MCV 83.7 07/25/2011   PLT 261 07/25/2011   UA: spec grav: 1/025, glucose: neg, hemoglobin: small, bilirubin: neg, ketones: 15, total protein: 100, urobilinogen: 1.0, nitrite: positive, leuk esterase: small   Assessment and Plan: [name] is a 35 y.o. year old female presenting with urinary tract infection 1. UTI: start keflex 500mg  tid and give percocet for abdominal pain control.  2. Care level currently exceeds family's ability to meet her medical needs. Pt would be unsafe to discharge home at this time. Will consult social work for placement for skilled nursing facility.  3. Hypertension: continue home amlodipine 4. Recurrent PE: on coumadin chronically. Stable. Continue coumadin and dose per pharmacy 5. CP: continue home meds 6. Psych: continue sertraline, lamictal, klonopin, abilify, topamax 7. FEN/GI: NA at  100cc/hr  8. Disposition: admit for observation

## 2011-07-25 NOTE — Telephone Encounter (Signed)
Please inform pt that we cannot Rx antibiotics w/o seeing her.  See can be seen as a work in tomorrow morning when she comes for her INR check.  She will need to provide a urine sample at that time as well.

## 2011-07-25 NOTE — Telephone Encounter (Signed)
Will fwd. To PCP for advise. .[name] [name]  

## 2011-07-26 ENCOUNTER — Ambulatory Visit: Payer: Medicaid Other

## 2011-07-26 DIAGNOSIS — R55 Syncope and collapse: Secondary | ICD-10-CM

## 2011-07-26 LAB — CBC
HCT: 34.2 % — ABNORMAL LOW (ref 36.0–46.0)
Hemoglobin: 12.1 g/dL (ref 12.0–15.0)
Hemoglobin: 11.3 g/dL — ABNORMAL LOW (ref 12.0–15.0)
MCH: 27.8 pg (ref 26.0–34.0)
MCHC: 33 g/dL (ref 30.0–36.0)
Platelets: 261 10*3/uL (ref 150–400)
RBC: 4.36 MIL/uL (ref 3.87–5.11)
RBC: 4.06 MIL/uL (ref 3.87–5.11)
WBC: 8.9 10*3/uL (ref 4.0–10.5)
WBC: 7 10*3/uL (ref 4.0–10.5)

## 2011-07-26 LAB — BASIC METABOLIC PANEL
CO2: 24 mEq/L (ref 19–32)
CO2: 23 mEq/L (ref 19–32)
Calcium: 9.2 mg/dL (ref 8.4–10.5)
Chloride: 104 mEq/L (ref 96–112)
Chloride: 111 mEq/L (ref 96–112)
Creatinine, Ser: 0.56 mg/dL (ref 0.50–1.10)
GFR calc Af Amer: >60 mL/min (ref >60)
Glucose, Bld: 90 mg/dL (ref 70–99)
Potassium: 3.1 mEq/L — ABNORMAL LOW (ref 3.5–5.1)
Sodium: 138 mEq/L (ref 135–145)
Sodium: 143 mEq/L (ref 135–145)

## 2011-07-26 LAB — PROTIME-INR
INR: 2.29 — ABNORMAL HIGH (ref 0.00–1.49)
INR: 2.81 — ABNORMAL HIGH (ref 0.00–1.49)
Prothrombin Time: 25.6 seconds — ABNORMAL HIGH (ref 11.6–15.2)
Prothrombin Time: 30 seconds — ABNORMAL HIGH (ref 11.6–15.2)

## 2011-07-26 LAB — URINALYSIS, ROUTINE W REFLEX MICROSCOPIC
Bilirubin Urine: NEGATIVE
Glucose, UA: NEGATIVE mg/dL
Protein, ur: 100 mg/dL — AB
Urobilinogen, UA: 1 mg/dL (ref 0.0–1.0)

## 2011-07-26 LAB — DIFFERENTIAL
Basophils Absolute: 0 10*3/uL (ref 0.0–0.1)
Basophils Relative: 0 % (ref 0–1)
Lymphs Abs: 1.9 10*3/uL (ref 0.7–4.0)
Monocytes Relative: 7 % (ref 3–12)
Neutro Abs: 6.2 10*3/uL (ref 1.7–7.7)
Neutrophils Relative %: 70 % (ref 43–77)

## 2011-07-26 LAB — URINE MICROSCOPIC-ADD ON

## 2011-07-27 LAB — PROTIME-INR: Prothrombin Time: 29.9 seconds — ABNORMAL HIGH (ref 11.6–15.2)

## 2011-07-27 LAB — URINE CULTURE: Colony Count: >=100000

## 2011-07-28 LAB — CBC
HCT: 33.8 % — ABNORMAL LOW (ref 36.0–46.0)
Hemoglobin: 11 g/dL — ABNORMAL LOW (ref 12.0–15.0)
MCH: 27.6 pg (ref 26.0–34.0)
MCHC: 32.5 g/dL (ref 30.0–36.0)
RBC: 3.99 MIL/uL (ref 3.87–5.11)

## 2011-07-28 LAB — PROTIME-INR
INR: 2.24 — ABNORMAL HIGH (ref 0.00–1.49)
Prothrombin Time: 25.2 seconds — ABNORMAL HIGH (ref 11.6–15.2)

## 2011-07-29 ENCOUNTER — Inpatient Hospital Stay: Payer: Medicaid Other | Admitting: Family Medicine

## 2011-07-29 LAB — BASIC METABOLIC PANEL
CO2: 25 mEq/L (ref 19–32)
Calcium: 9.2 mg/dL (ref 8.4–10.5)
GFR calc non Af Amer: >60 mL/min (ref >60)
Potassium: 3.9 mEq/L (ref 3.5–5.1)
Sodium: 140 mEq/L (ref 135–145)

## 2011-07-29 LAB — PROTIME-INR: INR: 2.1 — ABNORMAL HIGH (ref 0.00–1.49)

## 2011-07-30 NOTE — Discharge Summary (Signed)
[name] [name], [name] [name] NO.:  [id number]  MEDICAL RECORD NO.:  [id number]  LOCATION:  4742                         FACILITY:  [facility]  PHYSICIAN:  Nestor Ramp, MD        DATE OF BIRTH:  1976/09/23  DATE OF ADMISSION:  07/17/2011 DATE OF DISCHARGE:  07/24/2011                              DISCHARGE SUMMARY   PRIMARY CARE PROVIDER:  Dem[facility] Pore, [facility]  DISCHARGE DIAGNOSES: 1. Syncope. 2. Recurrent pulmonary embolism. 3. Cerebral palsy. 4. Migraines. 5. Depression. 6. Hypertension.  DISCHARGE MEDICATIONS: 1. Albuterol 2.5 mg/3 mL nebulizer inhalation solution every 4 hours     as needed. 2. Amlodipine 5 mg p.o. daily. 3. Aripiprazole 5 mg by mouth daily. 4. Oxybutynin 5 mg by mouth every 8 hours. 5. Topiramate 25 mg by mouth daily. 6. Baclofen 10 mg 4 times daily. 7. Benzonatate 1-2 capsules every 8 hours as needed. 8. Hydrocodone/APAP 5/325 one tablet by mouth every 6 hours as needed. 9. Imitrex 100 mg 1 tablet by mouth daily as needed for migraines. 10.Klonopin 1 mg 1 tablet by mouth 4 times daily as needed. 11.Lamictal 200 mg 1 tablet by mouth every morning. 12.Glargine 10 mg 1 tablet by mouth daily. 13.MiraLax 1 capsule by mouth daily at bedtime as needed. 14.Ondansetron 1 tablet under the tongue every hour as needed. 15.Sertraline 100 mg 2 tablets by mouth every morning. 16.Tizanidine 2 mg 2 tablets by mouth daily as needed. 17.Tramadol 50 mg 1 tablet by mouth every 6 hours as needed. 18.Warfarin 7.5 mg 1 tablet by mouth daily.  CONSULTATIONS: 1. Pulmonology, Dr. Koren Bound on July 18, 2011. 2. Neurology, Dr. Beryle Beams on July 19, 2011.  PROCEDURES: 1. July 17, 2011, chest CT angio positive for two pulmonary emboli     seen, one in the subsegmental branch of the left lower lobe and one     in the subsegmental branch of the right lower lobe.   2. Head CT without contrast on July 17, 2011 was negative and  showed     no change from prior exam. 3. CT angio of the head on July 21, 2011, unremarkable.  CT     angiography intracranial circulation, no acute intracranial     findings. 4. EEG on July 23, 2011, which was normal. 5. Wet prep induced colonial probe.  Wet prep shows clue cells.  GC     and Chlamydia probe was negative GC and Chlamydia. 6. On July 17, 2011, a 2-D cardiac echo was within normal limits.     Dopplers upper extremity and lower extremities were both negative.     EKG was within normal limits.  LABORATORY DATA:  On admission, sodium 143, potassium 3.1, chloride 111, bicarb 24, BUN 11, creatinine 0.53, and glucose 76.  White blood cell count 6.7, hemoglobin 13.5, hematocrit 40.5, and platelet 260.  Cardiac enzymes:  Troponins were less than 0.3 x3.  Creatinine kinase-MB:  6.8, 5.3, and 4.1.  INR 2.4.  At discharge, sodium 144, potassium 3.9, chloride 109, bicarb 27, BUN 12, creatinine 0.51, glucose 71, and calcium 9.0.  Hemoglobin 11.3, hematocrit 35.5, white blood cell count 6.9, and  platelets 264.  INR 1.76 on May 24, 2011 and INR was 2.03 on May 23, 2011.  HOSPITAL COURSE:  This is a 35 year old female with history of cerebral palsy, recurrent pulmonary embolism, depression, migraines, hypertension who was admitted for an episode of loss of consciousness. 1. Syncope.  The patient lost consciousness at a bus stop and was     brought by EMS to the hospital.  The patient describes having had     multiple episodes of blackouts since May.  The patient had a head     CT in the ED which was within normal limits.  An echo was done     which was also within normal limits and EKG was unremarkable.     Cardiac enzymes were negative x3.  On third day of admission, the     patient was witnessed by one of residents having a syncopal event     where she lost consciousness for 10 seconds with associated hypoxia     to 83% O2 sat.  A similar episode was witnessed the  following day     with 5-10 seconds loss of consciousness.  No repetitive movements     were ever observed during these events.  Neurology was consulted     and ruled out an epileptic seizure.  The differential included     cardiac dysrhythmia, vasovagal events due to syncope, breath     holding, and nonconvulsive seizures.  MRI and MRA were ordered but     unable to obtain due to the baclofen pump.  A head CT angio was     within normal limits and EEG was normal as well.  Neurology     recommended a followup for an outpatient sleep study and a followup     with the patient's neurologist, Dr. Sharene Skeans.  In an effort to     decrease the patient's sedation, her Abilify was decreased from 10     mg to 5 mg and her Klonopin dose was decreased as well.  She was     started on Effexor during her hospitalization but the Effexor was     weaned off since she was not taking that at home. 2. Recurrent pulmonary embolism with a history of pulmonary embolism on chronic anticoagulation therapy.  A chest CT angio was done on admission which showed bilateral pulmonary emboli. INR before admission     was subtherapeutic at 1.2 on July 08, 2011.  INR on admission was     2.44.  Pulmonology was consulted and concluded that it was     difficult to ascertain if the PEs were new or old.  Given the     minimal pulmonary emboli, clot burden on CT, and minimal oxygen     demands, Pulmonology did not find an IVC filter to be warranted.     Pulmonology recommended hypercoagulable workup as outpatient.  The     patient's INR on discharge was 1.76 and discharged on Coumadin 7.5     mg with a close followup to check an INR 2 days after discharge on     Friday, July 26, 2011. 3. Brown vaginal discharge.  The patient complained of vaginal     discharge during hospitalization which was worked up with a wet     prep and a GC and Chlamydia probe.  Wet prep showed clue cells and     GC and Chlamydia was negative.  The  patient was started on  clindamycin for the bacterial vaginosis but this was subsequently     stopped due to the complexity of the medicines she was already on. 4. Hypertension.  The patient's high blood pressure was stable     throughout the hospitalization staying around 110 systolic and the     patient was kept on her amlodipine 5 mg during the hospitalization     and discharge on Norvasc as well. 5. Cerebral palsy.  The patient was kept on her medicine for cerebral     palsy and was discharged in stable condition on that same point. 6. The patient did have a couple of episodes of migraine headaches for     which she was given sumatriptan in the hospital with resolution of     the migraine headache. 7. Depression.  The patient was put on her home psychiatric medicines,     started in the hospital on Effexor which was subsequently weaned     and discontinued.  The patient was discharged on sertraline 100 mg     2 tablets per day.  DISCHARGE INSTRUCTIONS:  The patient to follow a regular diet and to follow up with his INR appointment at the [facility] 2 days after discharge from the hospital to get her Coumadin, to get her INR checked, and her Coumadin level adjusted.  The patient was advised to return if any increase in shortness of breath, difficulty breathing, fevers, increased loss of consciousness or altered mental status.  FOLLOWUP APPOINTMENTS:  The patient is to follow up with primary care provider at [facility] with Dr. Everrett Coombe on the following Thursday, July 29, 2011 at 10:00 a.m.  The patient also is to get a sleep study set up as outpatient.  The patient to also get a hypercoagulability workup as outpatient.  DISCHARGE CONDITION:  The patient was discharged home in stable medical condition.    ______________________________ [name], MD   ______________________________ Nestor Ramp, MD    SL/MEDQ  D:  07/25/2011  T:  07/26/2011   Job:  [id number]  Electronically Signed by [name] MD on 07/26/2011 10:33:08 PM Electronically Signed by Denny Levy MD on 07/30/2011 11:25:19 AM

## 2011-08-01 ENCOUNTER — Inpatient Hospital Stay: Payer: Medicaid Other | Admitting: Family Medicine

## 2011-08-05 NOTE — Consult Note (Signed)
NAMECAITLAN, CHAUCA NO.:  [id number]  MEDICAL RECORD NO.:  [id number]  LOCATION:  4742                         FACILITY:  [facility]  PHYSICIAN:  Beryle Beams, MD   DATE OF BIRTH:  1976/04/10  DATE OF CONSULTATION:  07/19/2011 DATE OF DISCHARGE:                                CONSULTATION   CONSULTING PHYSICIAN:  Huntley Dec L. Jennette Kettle, MD  HISTORY:  Ms. Trevathan is a 35 year old white female with a known prior history of cerebral palsy as well as recurrent pulmonary emboli and migraine, undergoing evaluation for syncope.  History is according to review of the hospital chart and conversation with the patient's nurse, and for the patient to provide little history at this time.  Ms. Christenson was readmitted to [facility] on July 17, 2011, after having a syncopal episode while at a bus stop.  She apparently fell to her side, loss of consciousness for unclear period of time, and then was arousable up to the point that EMS was able to take her to the emergency room.  There was a note made of her apparently feeling fatigued and weak for the prior few weeks.  Probably just prior to passing out, she had had some palpitations.  Shortly after admission she underwent further testing including a head CT without contrast on July 17, 2011, that was unremarkable and no change from prior exam September 2011.  She also underwent a CT angio of the chest on July 17, 2011, that was positive for pulmonary emboli.  She subsequently was placed on Coumadin and currently there are eventual plans for possible [place] IVC filter. She otherwise apparently had remained stable and then today had a second episode where she is apparently had a second syncopal episode. Apparently, a provider was talking to her when it was noticed she became unresponsive for 15 seconds with apnea associated with hypoxia down to 82% on pulse ox.  She had spontaneous resolution and apparently was able to voice  that she "blacked out" and "did not intentionally have a breath holding spell."  Vital signs at that time was notable for 97% sats on 2 liters of oxygen and with stable blood pressure.  She was alert and then interactive and Neurology consult obtained for further evaluation.  She has had no true episodes since that second episode to my knowledge.  Apparently, Ms. Hargrove has had some prior syncopal episodes and has been evaluated by Dr. Sharene Skeans and reportedly these episodes were deemed not to be seizures.  There is note made that she has had a thorough workup of seizures in the past with no signs of seizure activity and continues to be video EEG.  It is unclear that some of her spells were caught during that time.  PAST MEDICAL HISTORY:  Significant for history of chronic pulmonary emboli, cerebral palsy, joint contractures, hypertension, OCD, depression, migraine, degenerative joint disease, dysarthria, endometriosis, history of recurring urinary tract infections or reflux disease.  PAST SURGICAL HISTORY:  Includes colposcopy, cesarean section, tubal ligation, carpal tunnel release for surgery, baclofen pump placement, appendectomy, IUD.  Medications at this time include, Norvasc, Abilify, baclofen, Klonopin, Lamictal, Claritin, Ditropan, MiraLax, Seroquel, Zoloft, Zanaflex, Topamax, Effexor, Coumadin,  Ventolin inhaler, hydrocodone, Zofran.  ALLERGIES:  No known drug allergies.  SOCIAL HISTORY:  She resides in the [place] area.  She is divorced, has 2 children, never smoked.  Lives at home with a caregiver.  FAMILY HISTORY:  Significant for asthma, cancer in primary relatives.  REVIEW OF SYSTEMS:  A 12-point review of systems is documented by Dr. Molli Knock on July 18, 2011, was reviewed.  I agreed to this as noted.  PHYSICAL EXAMINATION:  GENERAL:  A pleasant, cooperative young white female who is in no acute distress.  She is currently afebrile. VITAL SIGNS:  Stable.  Her  blood pressures is 115/76.  She is alert and is fluent, but spastic dysarthria.  She has intermittent facial grimacing noted in cast.  She is oriented, is able to name, repeat follow commands.  Extraocular movements were intact.  Pupils equal and reactive to light.  Face is symmetric.  Palate elevates symmetrically. However, she appears to have a narrow palate.  Tongue protrudes midline, otherwise cranial nerves II-XII appear to be intact. MOTOR EXAMINATION:  She has notable spasticity, lower extremities, especially over the right side and to a lesser degree in the left. There is notable plantar inversion of her right foot with intermittent almost clear form type movements arms and legs at times.  She has difficulties performing full motor examination due to coordination issues, but at base, able to move her extremities against gravity. Lower extremities were probably grade 2 to 3 over 5 strength.  Sensory exam, she responded to pinprick evaluation in all four extremities. Cerebellar exam once again she appears to have spasticity which limits her ability to perform finger-to-nose testing in both upper extremities. Deep tendon reflexes were trace to 1+ and throughout with toes upgoing bilaterally. CARDIOVASCULAR:  Regular rate and rhythm without murmur, rub, or gallop. No carotid bruits.  No cyanosis, clubbing, or edema. ABDOMEN:  Soft, nontender, nondistended with normoactive bowel sounds. Baclofen pump is palpated. LUNGS:  Clear to auscultation bilaterally. SKIN:  Without obvious bruises or other abnormalities noted.  LABORATORY VALUES:  Include white count is 6.1, INR is 2.78 today.  Her sodium level was 142, and a CK slightly high at 314, other labs with an elevated MB of 6.8.  Other labs were mostly unremarkable.  ASSESSMENT: 1. Syncope, recurrent syncope of unclear etiology, ( but doubt     seizure). 2. History of cerebral palsy. 3. History of migraine.  DISCUSSION:  At this  time, Ms. Recht presents with a history of cerebral palsy and recurrent pulmonary emboli and at least 2 syncopal episodes with one occurring prior to admission, the other occurring earlier today.  Neither of these episodes have a description that sounds particularly consistent with an epileptic event.  With the episode today, she apparently had very quick recovery and was able to voice how she felt during the spell.  Neurologic examination is essentially nonfocal.  Overall, the difference is probably brought at this point include cardiac dysrhythmia vasovagal event "pseudosyncope" breath holding, nonconvulsive seizure or even something potentially vascular (VBI is doubted, if she truly had lost consciousness).  PLAN:  At this time, we will order an EEG, I would consider epilepsy monitoring and evaluation, if she has recurrence spells suspicious for nonepileptic seizures.  Head MRI and MRA we ordered however, we were unable to perform this with the baclofen pump, then I would suggest head CTs with CT angiogram to evaluate her basilar artery.  We will order continuous pulse ox and she may  eventually need a sleep study as she appears to be at high risk for sleep apnea.  Telemetry monitoring should also be considered. I will continue ongoing medical cardiac evaluations.  Thank you very much for allowing me to participate in the care of this interesting and pleasant patient.          ______________________________ Beryle Beams, MD     RY/MEDQ  D:  07/19/2011  T:  07/20/2011  Job:  [id number]  Electronically Signed by Beryle Beams MD on 08/05/2011 08:38:35 AM

## 2011-08-07 NOTE — Discharge Summary (Signed)
[name] [name], DOETSCH NO.:  [id number]  MEDICAL RECORD NO.:  [id number]  LOCATION:  5003                         FACILITY:  [facility]  PHYSICIAN:  Leighton Roach [name], M.D.DATE OF BIRTH:  1976-06-19  DATE OF ADMISSION:  07/25/2011 DATE OF DISCHARGE:  07/29/2011                              DISCHARGE SUMMARY   PRIMARY CARE PROVIDER:  Demetria Pore, MD at [facility].  DISCHARGE DIAGNOSES: 1. Urinary tract infection. 2. Cerebral palsy. 3. Recurrent pulmonary embolism, on chronic Coumadin. 4. Hypertension. 5. Depression. 6. Migraine. 7. Obsessive-compulsive disorder.  DISCHARGE MEDICATIONS:  New medicine on discharge: 1. Keflex 500 mg p.o. t.i.d. to take for 7 days. 2. Percocet 5/325 one tablet by mouth every 6 hours as needed for     pain. 3. Phenazopyridine 200 mg by mouth every 8 hours to take for 2 days. 4. MiraLAX 17 g by mouth twice daily.  Home medicine to continue on discharge: 1. Warfarin 5 mg tablet 7.5 mg daily. 2. Baclofen 10 mg by mouth 4 times daily. 3. Clonazepam 1 mg one tablet by mouth 4 times daily. 4. Sumatriptan 100 mg by mouth 1 tablet daily as needed for migraine. 5. Lamictal 200 mg one tablet by mouth every morning. 6. Loratadine 10 mg 1 tablet by mouth daily. 7. Metoprolol XL succinate 100 mg one tablet by mouth daily. 8. Quetiapine 200 mg one tablet by mouth daily. 9. Sertraline 100 mg 2 tablets by mouth every morning. 10.Tizanidine 2 mg 2 tablets by mouth daily at bedtime. 11.Topiramate 100 mg one tablet by mouth daily at bedtime.  CONSULTS:  None.  PROCEDURE:  None.  LABORATORY DATA: 1. Urinalysis, specific gravity 1.025, 15 ketones, small blood, 100     protein, 1 urobilinogen, positive nitrites, small leukocytes.     Urine micro, 11-20 wbc's, 0-2 rbc's, many bacteria. 2. Admission CBC, WBC 8.9, hemoglobin 12.1, hematocrit 36.5, platelet     261.  Normal diff. 3. BMET, sodium 138, potassium 3.3, chloride  104, bicarb 24, glucose     90, BUN 20, creatinine 0.48, calcium 9.2. 4. Urine culture, pan sensitive E. coli more than 100,000 colonies. 5. Discharge CBC, WBC 6, hemoglobin of 11, hematocrit 33.8, platelet     count 246.  Discharge BMET, sodium 140, potassium 3.9, chloride     108, bicarb 25, glucose 92, BUN 13, creatinine 0.61, calcium 9.2. 6. INR on admission 2.29, INR on discharge 2.10.  BRIEF HOSPITAL COURSE:  A 35 year old female with history of cerebral palsy, recurrent pulmonary embolism, on chronic Coumadin, depression, migraine, hypertension, and obsessive-compulsive disorder, who presented with urinary tract infection and need for placement after recent hospitalization. 1. Urinary tract infection.  The patient had been discharged from     hospital for workup of syncope and recurrent PEs.  Return to     hospital one day after discharge with the abdominal pain and     dysuria.  Urinalysis showed urinary tract infection.  The patient     was started on Keflex 500 mg t.i.d. and Macrobid for treatment of enterococcus     given recent history of Foley catheter. Urine culture     returned as pansensitive  E. coli and Macrobid was discontinued.     The patient still complained of pain with urination and was     discharged on phenazopyridine and Percocet for pain.  The patient     was to finish 10-day course of Keflex. 2. Recurrent pulmonary embolisms, stable during hospitalization.  The     patient is on Coumadin 7.5 mg per day.  The patient was therapeutic     with an INR of 2.10 on discharge. 3. Hypertension, blood pressure was controlled on metoprolol with     blood pressure 103/69 on discharge. 4. Cerebral palsy, stable, continue baclofen, tizanidine, quetiapine,     Lamictal, and clonazepam. 5. Migraine, continue topiramate prophylaxis and Imitrex as needed,     stable during admission. 6. Depression, continue sertraline 100 mg p.o.  DISCHARGE INSTRUCTIONS:  The patient is to  get INR level checked.  INR to be between 2 and 3 for therapeutic Coumadin dose.  The patient was discharged to SNF in stable medical condition.    ______________________________ [name] Chancy, MD   ______________________________ Leighton Roach [name], M.D.    SL/MEDQ  D:  07/29/2011  T:  07/29/2011  Job:  [id number]  cc:   [name]  Electronically Signed by [name] Chancy MD on 08/03/2011 04:45:34 PM Electronically Signed by Acquanetta Belling M.D. on 08/07/2011 08:25:20 AM

## 2011-08-07 NOTE — H&P (Signed)
[name] [name], [name] [name] NO.:  [id number]  MEDICAL RECORD NO.:  [id number]  LOCATION:  [facility]                         FACILITY:  [facility]  PHYSICIAN:  [name] Ramp, MD        DATE OF BIRTH:  1976/02/02  DATE OF ADMISSION:  07/17/2011 DATE OF DISCHARGE:                             HISTORY & PHYSICAL   PRIMARY CARE PROVIDER:  Demetria Pore, MD at [facility].  CHIEF COMPLAINT:  Syncope.  HISTORY OF PRESENT ILLNESS:  A 35 year old female with a past medical history significant for cerebral palsy who was sitting at a bus stop today in her wheelchair when she fell to the side and lost consciousness.  She was arousable and by-passers called 911 and the EMS took her to the emergency room.  She notes that she had been feeling fatigued and weak for the last few weeks.  Just prior to passing out, [name] [name] noted palpitations, but denied chest pains.  She also notes reduced p.o. intake in the last few while and denies any fevers or chills, otherwise she is doing well at her baseline.  PAST MEDICAL HISTORY:  Significant for: 1. History of recurrent pulmonary embolism needing lifelong     anticoagulation. 2. Cerebral palsy. 3. Multiple joint contractures using a wheelchair to get around. 4. Hypertension. 5. OCD. 6. Depression. 7. Migraine. 8. DJD. 9. Dysarthria. 10.Endometriosis. 11.History of recurrent UTIs. 12.GERD.  SURGICAL HISTORY: 1. Colposcopy 2. Cesarean section. 3. Tubal ligation. 4. Carpal tunnel release. 5. Wrist surgery. 6. Baclofen pump refill. 7. Appendectomy. 8. IUD.  SOCIAL HISTORY:  Divorced, two children, never smoker, no oral tobacco, no drugs.  Lives in the home with her caregiver, lives with the daughter and several other people, uses a wheelchair to get around.  The patient needs assistant with activities, ADLs, and IADLs, has personal care services 20 hours a week.  FAMILY HISTORY:  Asthma, cancer in primary  relatives.  ALLERGIES:  No drug allergies.  MEDICATIONS:  Extensive, this was completed in Epic and with medication reconciliation and E-chart.  REVIEW OF SYSTEMS:  Please see HPI, otherwise normal.  PHYSICAL EXAMINATION:  VITAL SIGNS:  Heart rate 86-124, blood pressure 117/73, respiratory rate 16, satting 97% on room air, temperature 96.9. GENERAL:  Alert and cooperative in no acute distress. HEENT:  Shows supple neck.  Clear sclerae. HEART:  Regular rate and rhythm.  No murmurs, rubs, or gallops. ABDOMEN:  Soft, nontender, and nondistended. EXTREMITIES:  Flexion contractures, but nonedematous. SKIN:  No rashes or ecchymosis. NEUROLOGIC:  Seems unchanged based on prior studies and my prior experienced with this patient.  She is slow, but difficulty understand her speech.  She is alert and oriented x3 and she has flexion contractures, but no changes.  LABORATORY STUDIES:  Basic metabolic panel significant for potassium 3.4 and creatinine of 0.8.  CBC is normal with a hemoglobin of 14.6. Urinalysis shows ketones, but is otherwise bland.  CT angiogram of the chest shows pulmonary embolism in both right lower lobe and the left lower lobe.  CT of head shows no acute disease and her cardiac enzymes are normal.  Her INR today is 2.44, at  was 1.2 on July 08, 2011 and 3.0 on June 26, 2011.  ASSESSMENT AND PLAN:  [name] [name] is a 35 year old female with syncope and cerebral palsy. 1. Syncope associated with palpitations and a pulmonary embolism on     her CT scan.  I certainly expect that her syncope may be     cardiogenic.  I am not sure if her pulmonary embolism is new and     several weeks old and we will discuss that further below.     Pulmonary embolism may certainly be related to this syncopal     episode.  Seizure is also possibility; however. [name] [name] was     evaluated for seizure disorder by [name] [name] that found do not     have true seizures.  Additionally, I think  metabolic or infectious     causes are less likely, lab values were relatively normal on     admission.  Plan to watch her on telemetry overnight and cycle     cardiac enzymes.  We will may get an echocardiogram in the morning     for an assessment of her right heart strain to support a     presumptive diagnosis of a new pulmonary embolism causing reduced     cardiac output and syncope, although her hemodynamics are currently     stable.  We will follow her. 2. Pulmonary embolism, diagnosed on CT scan today, this is either a     new pulmonary embolism or evidence of old pulmonary embolism.  She     is certainly therapeutic with an INR today, but one week ago, it     was subtherapeutic.  She may have had a pulmonary embolism     evaluation.  She has not been feeling well for weeks and she may     have had a pulmonary embolism weeks ago.  The time is that     pulmonary embolism will make a difference for our continue     treatment plan.  If pulmonary embolism is new and happened while     therapeutic on warfarin, she may need lifelong anticoagulation with     Lovenox and alternative agent.  If not, then we can likely safely     continue her warfarin.  The plan to continue the warfarin tonight     and discuss this with the team in the morning. 3. Cerebral palsy, which is stable, continuing home medications     significantly baclofen. 4. Hypertension.  Also continue on home medications.  Blood pressure     is okay today.  We will follow.  If she is low, we may discontinue     the blood pressure medications or reduce the dose. 5. Psychiatric.  She seems to be at baseline.  Continue on her home     medications. 6. Fluids, electrolytes, nutrition/gastrointestinal.  IV fluids at 100     mL an hour and a normal diet. 7. Prophylaxis with therapeutic, on warfarin. 8. Disposition, when workup complete.     [name] Graham, MD   ______________________________ [name] Ramp, MD    EC/MEDQ  D:   07/17/2011  T:  07/18/2011  Job:  [id number]  Electronically Signed by [name] [name]  on 08/02/2011 09:07:14 AM Electronically Signed by [name] Levy MD on 08/07/2011 03:07:47 PM

## 2011-08-07 NOTE — H&P (Signed)
NAMEARRAYAH, CONNORS NO.:  [id number]  MEDICAL RECORD NO.:  [id number]  LOCATION:  5003                         FACILITY:  [facility]  PHYSICIAN:  Leighton Roach McDiarmid, M.D.DATE OF BIRTH:  1976/01/09  DATE OF ADMISSION:  07/25/2011 DATE OF DISCHARGE:                             HISTORY & PHYSICAL   PRIMARY CARE PROVIDER:  Demetria Pore, MD  CHIEF COMPLAINT:  Abdominal pain.  HISTORY OF PRESENT ILLNESS:  [name] is a 35 year old female with cerebral palsy and recurrent PEs who was discharged from the hospital yesterday for syncope.  She presented today with abdominal pain and dysuria for 1 day.  She denies any fevers or chills or any lower back pain.  She has had lower appetite but denies any nausea or vomiting. She came to the ED where she was diagnosed with a UTI.  She is going to be discharged home but she lives alone at home and no family member could care for her.  She reports feeling weak and having trouble taking care of herself.  PAST MEDICAL HISTORY: 1. Cerebral palsy. 2. Pulmonary embolism on chronic Coumadin. 3. Contracture joint in multiple sites. 4. Hypertension. 5. Obsessive-compulsive disorder. 6. Depression. 7. Migraine. 8. Degenerative joint disease. 9. Dysarthria. 10.Endometriosis. 11.History of recurring UTIs. 12.GERD.  PAST SURGICAL HISTORY: 1. Colposcopy in July 2001. 2. Cesarean section. 3. Tubal ligation. 4. Carpal tunnel release in 2009. 5. Wrist surgery in 2011. 6. Baclofen pump refill. 7. Appendectomy. 8. Intrauterine device insertion for endometriosis in 2012.  SOCIAL HISTORY:  Marital status divorced.  Number of children 2.  Has never smoked and does not use alcohol and does not use any illicit drugs.  SYMPTOMS:  Asthma in her father, cancer in her maternal grandmother and paternal grandmother.  ALLERGIES:  No known allergies.  MEDICATIONS ON DISCHARGE FROM HOSPITAL: 1. Albuterol 2.5 mg. 2. Amlodipine 5 mg  p.o. daily. 3. Abilify 5 mg p.o. daily. 4. Oxybutynin 5 mg q. 8. 5. Topiramate 25 mg p.o. at night time. 6. Baclofen 10 mg four times a day. 7. Benzonatate 100 mg q. 8 p.r.n. 8. Hydrocodone/APAP every 6 hours p.r.n. 9. Imitrex 100 mg daily p.r.n. for migraines. 10.Klonopin 1 mg four times daily as needed. 11.Lamictal 200 mg per day. 12.Loratadine 10 mg daily. 13.MiraLax. 14.Ondansetron 8 mg every 8 hours p.r.n. nausea. 15.Sertraline 100 mg 2 tablets per day. 16.Tizanidine 2 mg 2 tablets per day. 17.Tramadol 50 mg q. 6. 18.Warfarin 7.5 daily.  REVIEW OF SYSTEMS:  Negative except as per HPI.  PHYSICAL EXAMINATION:  VITAL SIGNS:  Pulse 91, blood pressure 112/83, respiratory rate 16, O2 96% on room air, temperature 97.5. GENERAL:  Alert and tearful. HEENT:  Pupils are equal, round, reactive to light and accommodation. Extraocular movements were intact. HEART:  S1-S2 normal.  No murmur, rubs or gallops.  Regular rate and rhythm. LUNGS:  Clear to auscultation.  No wheezes or rales.  Nonlabored breathing. ABDOMEN:  Tenderness to palpation in the suprapubic area.  No guarding or rebound with negative CVA tenderness. EXTREMITIES:  No edema lower extremities and upper extremities with contractures. SKIN:  No rashes. NEUROLOGY:  No focal deficits.  LABORATORY DATA AND IMAGING:  Sodium 138, potassium 3.3, chloride 104, bicarb 24, BUN 20, creatinine 0.48, glucose 90.  WBC 8.9, hemoglobin 12.1, hematocrit 36.5, MCV 83.7, platelet 261.  Urinalysis specific gravity 1.025, glucose negative, hemoglobin small, bilirubin negative, ketones, 15, total protein 100, urobilinogen 1, nitrite positive, leukocyte esterase small.  ASSESSMENT AND PLAN:  [name] [name] is a 35 year old female presenting with urinary tract infection. 1. Urinary tract infection.  Start Keflex 500 mg three times a day and     we will give Percocet for abdominal pain control. 2. Care level currently exceeding the  family's ability to meet her     medical needs.  The patient would be unsafe to be discharged home     at this time.  We will consult Social Work for placement in skilled     nursing facility. 3. Hypertension.  Continue home amlodipine. 4. Recurrent pulmonary embolisms on Coumadin chronically, stable.     Continue Coumadin and dose per Pharmacy. 5. Cerebral palsy, continue with her home medications. 6. Psych.  Continue sertraline, Lamictal, Klonopin, Abilify and     Topamax. 7. FENGI normal saline at 100 mL per hour. 8. Disposition:  Admit for observation and placement to skilled     nursing facility per Social Work recommendations.    ______________________________ [name], MD   ______________________________ Leighton Roach McDiarmid, M.D.    SL/MEDQ  D:  07/25/2011  T:  07/26/2011  Job:  [id number]  Electronically Signed by [name] MD on 07/26/2011 10:37:25 PM Electronically Signed by Acquanetta Belling M.D. on 08/07/2011 08:25:13 AM

## 2011-08-22 LAB — BASIC METABOLIC PANEL
Calcium: 8.7
GFR calc Af Amer: >60
GFR calc non Af Amer: >60
Potassium: 3.5
Sodium: 138

## 2011-08-22 LAB — URINALYSIS, ROUTINE W REFLEX MICROSCOPIC
Hgb urine dipstick: NEGATIVE
Protein, ur: NEGATIVE
Urobilinogen, UA: 1

## 2011-08-22 LAB — CBC
HCT: 36.3
Hemoglobin: 12.2
Platelets: 295
RBC: 4.18
WBC: 5.7

## 2011-08-22 LAB — URINE CULTURE
Colony Count: NO GROWTH
Culture: NO GROWTH

## 2011-08-22 NOTE — Consult Note (Signed)
NAMELEVANA, [name] [name] NO.:  [id number]  MEDICAL RECORD NO.:  [id number]  LOCATION:  [facility]                         FACILITY:  [facility]  PHYSICIAN:  Felipa Evener, MD  DATE OF BIRTH:  [birth year]  DATE OF CONSULTATION:  07/18/2011 DATE OF DISCHARGE:                                CONSULTATION   IDENTIFICATION:  The patient is a 35 year old female with past medical history significant for cerebral palsy presenting to the hospital after she lost consciousness and fell on her wheelchair at the bus stop.  She was arousable by bystanders who called 911 and the patient was brought to the emergency department.  A CT angiogram of the chest was performed at that time that showed bibasilar small pulmonary emboli.  The patient has had a history of pulmonary embolus which she is being treated on a recurrent basis and on presentation her INR was 2.4.  Now, the repeat CT showed basilar PE, whether these are old or new is very difficult to ascertain at this time.  The patient does complain of some mild chest pain but does not complain of any shortness of breath at this time. Denied any fever, chills, nausea, vomiting, or abdominal pain but did complain of chest pain as mentioned in prior H and P.  PAST MEDICAL HISTORY:  Significant for recurrent pulmonary embolus requiring lifelong anticoagulation, cerebral palsy, multiple joint contractures using wheelchair to get around, systemic hypertension, OCD, depression, migraine, degenerative joint disease, dysarthria, endometriosis, recurrent UTIs, and GERD.  PAST SURGICAL HISTORY:  Significant for her colonoscopy, previous cesarean section, previous tubal ligation, carpal tunnel release, wrist surgery, baclofen pump, appendectomy, and IUD.  SOCIAL HISTORY:  She is divorced and has 2 children.  Never smoked, no tobacco, and no drugs.  She lives at home with her caregiver and lives with her daughter and several other people.  Uses  the wheelchair to get around.  The patient needs assistance for activities of daily living.  FAMILY HISTORY:  Significant for asthma and cancer in a primary relative.  MEDICATIONS AT HOME:  Reviewed.  ALLERGIES:  No known drug allergies.  REVIEW OF SYSTEMS:  Twelve-point review of systems is negative other than mentioned above.  PHYSICAL EXAMINATION:  GENERAL:  This is a elderly stated age-appearing female resting comfortably in the bed, in no acute distress. VITAL SIGNS:  Temperature 97.1, heart rate 74, respiratory rate 16, blood pressure 106/68, and oxygen saturation 100% on 2 L nasal cannula. HEENT:  Normocephalic and atraumatic.  Pupils are equal, round, and reactive to light.  Extraocular movements are intact.  Oronasal mucosa within normal limits. NECK:  No thyromegaly, lymphadenopathy, or jugular venous reflux appreciated. HEART:  Regular rate and rhythm.  S1-S2.  No murmurs, rubs, or gallops appreciated. LUNGS:  Decreased breath sounds at the bases, otherwise normal. ABDOMEN:  Soft, nontender, and nondistended.  Positive bowel sounds. EXTREMITIES:  In contracture but no edema is appreciated.  LABORATORY STUDIES:  Reviewed and significant for a chest CT.  I reviewed it myself.  It showed bibasilar small central emboli of indeterminate age.  Her BMP is sodium 143, potassium 3.1, chloride of 111, CO2 is 24, glucose is 76, BUN  is 11, creatinine 0.53, and calcium of 8.5.  Set of cardiac enzymes with a CK a total of 50, CK-MB 2.7, and troponin less than 0.3.  White blood cell count of 5.9, hemoglobin 11.6, hematocrit 34.4, and a platelet count of 240.  Urinalysis from yesterday showed increased ketones but otherwise negative.  ASSESSMENT AND PLAN:  The patient is a 35 year old female patient with a past medical history of pulmonary embolism who has returned to the emergency department after being found unconscious.  A CT angiogram at that time revealed bibasilar pulmonary  embolus.  She does have history of recurrent pulmonary embolism and was on lifelong anticoagulation and Pulmonary Critical Care was consulted who felt IVC filter will be helpful if there is any additional recommendation made for the recurrent pulmonary embolus.  The patient is debilitated and immobile which makes her chances of getting recurrent pulmonary embolus.  All are very high at this time.  The pulmonary emboli clot burden is minimal on CT angiogram and oxygen demands are minimal as well.  Therefore, an IVC filter will not be commended at this time, however, there are lower extremity Dopplers that were ordered and unless there is a very high clot burden in the lower extremity Doppler, we would not recommend for an IVC filter placement.  In the meantime, continue anticoagulation for an INR of 2-3 and titrate oxygen saturation 92-95%.  Hypercoagulable workup will be frequently used in the acute setting and will be recommended as outpatient.  Will followup with the hematologist if any further recommendations will be made with regards to her anticoagulation such as adding subcu heparin on top of the Coumadin if pulmonary embolism continues to be an issue in a patient of concern.  However, that is only used in extreme cases.     Felipa Evener, MD     WJY/MEDQ  D:  07/18/2011  T:  07/19/2011  Job:  [id number]  Electronically Signed by Koren Bound MD on 08/22/2011 02:53:34 PM

## 2011-09-02 LAB — CBC
HCT: 38.3
Hemoglobin: 12.8
WBC: 8.8

## 2011-09-02 LAB — URINALYSIS, ROUTINE W REFLEX MICROSCOPIC
Glucose, UA: NEGATIVE
Ketones, ur: NEGATIVE
Protein, ur: NEGATIVE

## 2011-09-02 LAB — COMPREHENSIVE METABOLIC PANEL
Alkaline Phosphatase: 91
BUN: 12
CO2: 27
Chloride: 104
Glucose, Bld: 100 — ABNORMAL HIGH
Potassium: 4.1
Total Bilirubin: 0.8

## 2011-09-02 LAB — URINE MICROSCOPIC-ADD ON

## 2011-09-06 ENCOUNTER — Emergency Department (HOSPITAL_COMMUNITY)
Admission: EM | Admit: 2011-09-06 | Discharge: 2011-09-07 | Disposition: A | Discharge status: Home / Self Care | Payer: Medicaid Other | Attending: Emergency Medicine | Admitting: Emergency Medicine

## 2011-09-06 ENCOUNTER — Emergency Department (HOSPITAL_COMMUNITY): Payer: Medicaid Other

## 2011-09-06 ENCOUNTER — Encounter (HOSPITAL_COMMUNITY): Payer: Self-pay | Admitting: Radiology

## 2011-09-06 DIAGNOSIS — I10 Essential (primary) hypertension: Secondary | ICD-10-CM | POA: Insufficient documentation

## 2011-09-06 DIAGNOSIS — R109 Unspecified abdominal pain: Secondary | ICD-10-CM | POA: Insufficient documentation

## 2011-09-06 DIAGNOSIS — Z86711 Personal history of pulmonary embolism: Secondary | ICD-10-CM | POA: Insufficient documentation

## 2011-09-06 DIAGNOSIS — Z79899 Other long term (current) drug therapy: Secondary | ICD-10-CM | POA: Insufficient documentation

## 2011-09-06 DIAGNOSIS — G809 Cerebral palsy, unspecified: Secondary | ICD-10-CM | POA: Insufficient documentation

## 2011-09-06 DIAGNOSIS — K8020 Calculus of gallbladder without cholecystitis without obstruction: Secondary | ICD-10-CM | POA: Insufficient documentation

## 2011-09-06 DIAGNOSIS — F0390 Unspecified dementia without behavioral disturbance: Secondary | ICD-10-CM | POA: Insufficient documentation

## 2011-09-06 DIAGNOSIS — N390 Urinary tract infection, site not specified: Secondary | ICD-10-CM | POA: Insufficient documentation

## 2011-09-06 LAB — URINALYSIS, ROUTINE W REFLEX MICROSCOPIC
Glucose, UA: NEGATIVE mg/dL
Nitrite: NEGATIVE
Protein, ur: 30 mg/dL — AB
pH: 5.5 (ref 5.0–8.0)

## 2011-09-06 LAB — CBC
HCT: 36.8 % (ref 36.0–46.0)
Hemoglobin: 12.1 g/dL (ref 12.0–15.0)
MCH: 27.6 pg (ref 26.0–34.0)
MCHC: 32.9 g/dL (ref 30.0–36.0)
MCV: 83.8 fL (ref 78.0–100.0)
Platelets: 233 10*3/uL (ref 150–400)
RBC: 4.39 MIL/uL (ref 3.87–5.11)
RDW: 15.4 % (ref 11.5–15.5)
WBC: 7.4 10*3/uL (ref 4.0–10.5)

## 2011-09-06 LAB — COMPREHENSIVE METABOLIC PANEL
AST: 10 U/L (ref 0–37)
BUN: 15 mg/dL (ref 6–23)
CO2: 30 mEq/L (ref 19–32)
Calcium: 10.1 mg/dL (ref 8.4–10.5)
Creatinine, Ser: 0.58 mg/dL (ref 0.50–1.10)
GFR calc Af Amer: >90 mL/min (ref >90)
GFR calc non Af Amer: >90 mL/min (ref >90)
Total Bilirubin: 0.3 mg/dL (ref 0.3–1.2)

## 2011-09-06 LAB — DIFFERENTIAL
Basophils Absolute: 0 10*3/uL (ref 0.0–0.1)
Basophils Relative: 0 % (ref 0–1)
Neutro Abs: 4.1 10*3/uL (ref 1.7–7.7)
Neutrophils Relative %: 55 % (ref 43–77)

## 2011-09-06 LAB — URINE MICROSCOPIC-ADD ON

## 2011-09-06 MED ORDER — IOHEXOL 300 MG/ML  SOLN
80.0000 mL | Freq: Once | INTRAMUSCULAR | Status: AC | PRN
  Administered 2011-09-06: 80 mL via INTRAVENOUS

## 2011-09-09 ENCOUNTER — Telehealth: Payer: Self-pay | Admitting: Family Medicine

## 2011-09-09 NOTE — Telephone Encounter (Signed)
Do I need to do anything about it, or is it an FYI?

## 2011-09-09 NOTE — Telephone Encounter (Signed)
Ms. Pelland is no longer receiving home health services.  Now in a rehab facility.  She's been there for more than 30 days, hence reason for discharge from services.

## 2011-09-16 NOTE — Telephone Encounter (Signed)
Just FYI.

## 2011-09-17 LAB — CBC
HCT: 38.6
Hemoglobin: 12.9
RBC: 4.51
WBC: 6.3

## 2011-09-17 LAB — BASIC METABOLIC PANEL
Calcium: 9.4
GFR calc Af Amer: >60
GFR calc non Af Amer: >60
Potassium: 4.6
Sodium: 137

## 2011-09-23 ENCOUNTER — Encounter: Payer: Self-pay | Admitting: Family Medicine

## 2011-09-23 ENCOUNTER — Ambulatory Visit (INDEPENDENT_AMBULATORY_CARE_PROVIDER_SITE_OTHER): Payer: Medicaid Other | Admitting: Family Medicine

## 2011-09-23 DIAGNOSIS — N938 Other specified abnormal uterine and vaginal bleeding: Secondary | ICD-10-CM

## 2011-09-23 DIAGNOSIS — R109 Unspecified abdominal pain: Secondary | ICD-10-CM

## 2011-09-23 DIAGNOSIS — N949 Unspecified condition associated with female genital organs and menstrual cycle: Secondary | ICD-10-CM

## 2011-09-23 MED ORDER — KETOROLAC TROMETHAMINE 60 MG/2ML IM SOLN
60.0000 mg | Freq: Once | INTRAMUSCULAR | Status: AC
  Administered 2011-09-23: 60 mg via INTRAMUSCULAR

## 2011-09-23 NOTE — Progress Notes (Signed)
Addended byArlyss Repress on: 09/23/2011 04:48 PM   Modules accepted: Orders

## 2011-09-23 NOTE — Progress Notes (Deleted)
  Subjective:    Patient ID: [name] [name], female    DOB: 1976/09/07, 35 y.o.   MRN: [id number]  HPI    Review of Systems     Objective:   Physical Exam        Assessment & Plan:

## 2011-09-23 NOTE — Assessment & Plan Note (Addendum)
Per Previous pcp note- Dr. Ta--Pt has history of endometriosis and had DUB in 2011.  She She had u/s that showed normal endometrial stripe.  She had endometrial bx that was wnl.  She has a lot of abd cramping every month.  This cramping was sometimes not relieved with hydrocodone.  She was referred to [facility] for IUD placement to help endometriosis and abd cramping.  [name] was placed 04/30/11 by [facility].  Pt still complains of abd cramping, which I am treating with hydrocodone and ultram. Pt reports some initial relief of pain from IUD, but states it has now returned back to similar to previous cramping. Gave patient a shot of Toradol 60 mg IM today-since positive exacerbation of abdominal cramping during past 2 weeks. Physical exam reassuring. Patient may need to return [facility] for further workup since she is plugged in with the GYN providers there for further treatment options regarding endometriosis and abdominal cramping. Unsure patient will be a good candidate for GnRH-agonist. The dysfunctional uterine bleeding now well controlled with IUD. In the meanwhile recommend pain control with hydrocodone and Ultram. Patient PCP now at [facility]. We'll send this consult note to provider--Dr. Leanord Hawking.  We'll also forward this note to Dr. Joesph Fillers was patient's primary doctor prior to patient going to [place].

## 2011-09-23 NOTE — Progress Notes (Addendum)
  Subjective:    Patient ID: [name] [name], female    DOB: Jul 16, 1976, 35 y.o.   MRN: [id number]  HPI Abdominal cramping: Patient states that she has had a flare of abdominal cramping x2 weeks. Has been receiving Percocet by primary care Dr. at rehabilitation facility. This has given some mild relief from pain. Cramping all over worse in lower abdominal area. Patient describes as "nonstop." Nothing seems to make it worse. No vaginal bleeding x2 weeks. IUD in place since May 2012. No nausea vomiting diarrhea. No fever. Has had similar abdominal pain exacerbations x2-3 years. IUD helped for a while but now abdominal cramping is back. Patient sent for evaluation/consultation by primary doctor that is caring for her at [facility]. Pt not currently sexually active.   Review of Systems As per above.    Objective:   Physical Exam  Constitutional: She is oriented to person, place, and time. She appears well-developed and well-nourished.  HENT:  Head: Normocephalic and atraumatic.  Eyes: Pupils are equal, round, and reactive to light.  Cardiovascular: Normal rate, regular rhythm and normal heart sounds.   No murmur heard. Pulmonary/Chest: Effort normal. No respiratory distress.  Abdominal: Soft. She exhibits mass (baclofen pump in right lower quadrant-no other mass). She exhibits no distension. There is tenderness (diffuse tenderness throughout abdomen). There is no rebound and no guarding.  Musculoskeletal: She exhibits no edema.       Positive contractures in all 4 extremities.  Neurological: She is alert and oriented to person, place, and time.  Skin: No rash noted.  Psychiatric: She has a normal mood and affect. Her behavior is normal.          Assessment & Plan:

## 2011-10-25 DIAGNOSIS — G809 Cerebral palsy, unspecified: Secondary | ICD-10-CM | POA: Insufficient documentation

## 2011-11-01 ENCOUNTER — Encounter (HOSPITAL_COMMUNITY): Payer: Self-pay | Admitting: *Deleted

## 2011-11-01 ENCOUNTER — Emergency Department (HOSPITAL_COMMUNITY)
Admission: EM | Admit: 2011-11-01 | Discharge: 2011-11-01 | Disposition: A | Discharge status: Home Health | Payer: Medicaid Other | Attending: Emergency Medicine | Admitting: Emergency Medicine

## 2011-11-01 DIAGNOSIS — Z79899 Other long term (current) drug therapy: Secondary | ICD-10-CM | POA: Insufficient documentation

## 2011-11-01 DIAGNOSIS — F3289 Other specified depressive episodes: Secondary | ICD-10-CM | POA: Insufficient documentation

## 2011-11-01 DIAGNOSIS — G809 Cerebral palsy, unspecified: Secondary | ICD-10-CM | POA: Insufficient documentation

## 2011-11-01 DIAGNOSIS — I10 Essential (primary) hypertension: Secondary | ICD-10-CM | POA: Insufficient documentation

## 2011-11-01 DIAGNOSIS — R209 Unspecified disturbances of skin sensation: Secondary | ICD-10-CM | POA: Insufficient documentation

## 2011-11-01 DIAGNOSIS — K219 Gastro-esophageal reflux disease without esophagitis: Secondary | ICD-10-CM | POA: Insufficient documentation

## 2011-11-01 DIAGNOSIS — Z9889 Other specified postprocedural states: Secondary | ICD-10-CM | POA: Insufficient documentation

## 2011-11-01 DIAGNOSIS — R471 Dysarthria and anarthria: Secondary | ICD-10-CM | POA: Insufficient documentation

## 2011-11-01 DIAGNOSIS — R5381 Other malaise: Secondary | ICD-10-CM | POA: Insufficient documentation

## 2011-11-01 DIAGNOSIS — F329 Major depressive disorder, single episode, unspecified: Secondary | ICD-10-CM | POA: Insufficient documentation

## 2011-11-01 DIAGNOSIS — Z86711 Personal history of pulmonary embolism: Secondary | ICD-10-CM | POA: Insufficient documentation

## 2011-11-01 DIAGNOSIS — Z7901 Long term (current) use of anticoagulants: Secondary | ICD-10-CM | POA: Insufficient documentation

## 2011-11-01 DIAGNOSIS — R202 Paresthesia of skin: Secondary | ICD-10-CM

## 2011-11-01 DIAGNOSIS — M1990 Unspecified osteoarthritis, unspecified site: Secondary | ICD-10-CM | POA: Insufficient documentation

## 2011-11-01 LAB — URINE MICROSCOPIC-ADD ON

## 2011-11-01 LAB — URINALYSIS, ROUTINE W REFLEX MICROSCOPIC
Bilirubin Urine: NEGATIVE
Glucose, UA: NEGATIVE mg/dL
Hgb urine dipstick: NEGATIVE
Ketones, ur: NEGATIVE mg/dL
Nitrite: NEGATIVE
Protein, ur: NEGATIVE mg/dL
Specific Gravity, Urine: 1.017 (ref 1.005–1.030)
Urobilinogen, UA: 0.2 mg/dL (ref 0.0–1.0)
pH: 7 (ref 5.0–8.0)

## 2011-11-01 LAB — CBC
HCT: 37.5 % (ref 36.0–46.0)
HCT: 37.5 % (ref 36.0–46.0)
Hemoglobin: 12.3 g/dL (ref 12.0–15.0)
Hemoglobin: 12.4 g/dL (ref 12.0–15.0)
MCH: 28.5 pg (ref 26.0–34.0)
MCHC: 32.8 g/dL (ref 30.0–36.0)
MCV: 87 fL (ref 78.0–100.0)
MCV: 87.6 fL (ref 78.0–100.0)
Platelets: 226 K/uL (ref 150–400)
RBC: 4.31 MIL/uL (ref 3.87–5.11)
RBC: 4.28 MIL/uL (ref 3.87–5.11)
RDW: 15.6 % — ABNORMAL HIGH (ref 11.5–15.5)
RDW: 15.6 % — ABNORMAL HIGH (ref 11.5–15.5)
WBC: 8.1 K/uL (ref 4.0–10.5)
WBC: 8.4 10*3/uL (ref 4.0–10.5)

## 2011-11-01 LAB — BASIC METABOLIC PANEL WITH GFR
BUN: 9 mg/dL (ref 6–23)
Chloride: 104 meq/L (ref 96–112)
Creatinine, Ser: 0.64 mg/dL (ref 0.50–1.10)
GFR calc Af Amer: >90 mL/min (ref >90)
GFR calc non Af Amer: >90 mL/min (ref >90)
Glucose, Bld: 103 mg/dL — ABNORMAL HIGH (ref 70–99)
Potassium: 4.1 meq/L (ref 3.5–5.1)

## 2011-11-01 LAB — DIFFERENTIAL
Basophils Absolute: 0 10*3/uL (ref 0.0–0.1)
Basophils Relative: 0 % (ref 0–1)
Eosinophils Absolute: 0.2 K/uL (ref 0.0–0.7)
Eosinophils Relative: 3 % (ref 0–5)
Lymphocytes Relative: 34 % (ref 12–46)
Lymphs Abs: 2.8 10*3/uL (ref 0.7–4.0)
Monocytes Absolute: 0.7 K/uL (ref 0.1–1.0)
Monocytes Relative: 9 % (ref 3–12)
Neutro Abs: 4.4 K/uL (ref 1.7–7.7)
Neutrophils Relative %: 54 % (ref 43–77)

## 2011-11-01 LAB — BASIC METABOLIC PANEL
CO2: 24 mEq/L (ref 19–32)
Calcium: 8.6 mg/dL (ref 8.4–10.5)
Sodium: 138 mEq/L (ref 135–145)

## 2011-11-01 NOTE — ED Provider Notes (Addendum)
History     CSN: [id number] Arrival date & time: 11/01/2011  4:08 PM   First MD Initiated Contact with Patient 11/01/11 1652      Chief Complaint  Patient presents with  . Weakness    (Consider location/radiation/quality/duration/timing/severity/associated sxs/prior treatment) Patient is a 35 y.o. female presenting with weakness. The history is provided by the patient.  Weakness Primary symptoms do not include headaches, fever, nausea or vomiting.  Additional symptoms include weakness.   The patient is a 35 year old, female with cerebral palsy.  She has dysarthria from her cerebral palsy making understanding of her speech somewhat difficult.  She says she has no numbness and tingling in her lower extremities.  She has had this in the past, but the etiology for was not determined.  She denies cough, shortness of breath, nausea, vomiting, diarrhea, urinary tract symptoms.  She denies focal weakness.  Her symptoms began today, and have been persistent. Past Medical History  Diagnosis Date  . Cerebral palsy   . Pulmonary embolism     Lifetime Coumadin  . Cerebral palsy     Spastic Cerebral palsy, mentally intact  . Contracture, joint, multiple sites     Electric wheelchair, uses left hand to operate chair.   [name] Kitchen Hypertension   . OCD (obsessive compulsive disorder)   . Depression   . Migraine   . [name]   . Dysarthria   . Endometriosis   . History of recurrent UTIs   . GERD (gastroesophageal reflux disease)   . Pulmonary embolism 2011, 01/2011    Will be on lifetime coumadin    Past Surgical History  Procedure Date  . Colposcopy 06/2000  . Cesarean section   . Tubal ligation 2003  . Carpal tunnel release 08/2008    Dr Teressa Senter  . Wrist surgery 06/2010    Dr Dierdre Searles, hand surgeon, [name]  . Baclofen pump refill     x 3 times  . Appendectomy   . Intrauterine device insertion 04/30/11    Inserted by [facility] GYN for endometriosis    Family History    Problem Relation Age of Onset  . Asthma Father   . Cancer Maternal Grandmother   . Cancer Paternal Grandmother     History  Substance Use Topics  . Smoking status: Never Smoker   . Smokeless tobacco: Never Used  . Alcohol Use: No    OB History    Grav Para Term Preterm Abortions TAB SAB Ect Mult Living                  Review of Systems  Constitutional: Negative for fever and chills.  HENT: Negative for congestion.   Eyes: Negative for visual disturbance.  Respiratory: Negative for cough and shortness of breath.   Cardiovascular: Negative for chest pain.  Gastrointestinal: Negative for nausea, vomiting, abdominal pain and diarrhea.  Genitourinary: Negative for dysuria.  Skin: Negative for rash.  Neurological: Positive for weakness. Negative for headaches.  Psychiatric/Behavioral: Negative for confusion.    Allergies  Review of patient's allergies indicates no known allergies.  Home Medications   Current Outpatient Rx  Name Route Sig Dispense Refill  . ARIPIPRAZOLE 10 MG PO TABS Oral Take 10 mg by mouth at bedtime.      [name] Kitchen BACLOFEN 10 MG PO TABS Oral Take 10 mg by mouth 4 (four) times daily. Take at 0600, 1200, 1800 & at bedtime     . CLONAZEPAM 1 MG PO TABS Oral Take 1  mg by mouth 4 (four) times daily.     . IBUPROFEN 400 MG PO TABS Oral Take 400 mg by mouth every 8 (eight) hours as needed. For pain     . LAMOTRIGINE 200 MG PO TABS Oral Take 400 mg by mouth daily.      [name] Kitchen LORATADINE 10 MG PO TABS Oral Take 10 mg by mouth at bedtime.      [name] Kitchen METOPROLOL SUCCINATE ER 100 MG PO TB24 Oral Take 100 mg by mouth every evening.      [name] Kitchen QUETIAPINE FUMARATE 100 MG PO TABS Oral Take 100 mg by mouth at bedtime.      . SERTRALINE HCL 100 MG PO TABS Oral Take 200 mg by mouth at bedtime.      . SUMATRIPTAN SUCCINATE 100 MG PO TABS Oral Take 100 mg by mouth every 2 (two) hours as needed. Take every 2 hours x2 doses. For migraines.     [name] Kitchen TIZANIDINE HCL 4 MG PO TABS Oral Take 4 mg by  mouth at bedtime.      . TOPIRAMATE 100 MG PO TABS Oral Take 100 mg by mouth at bedtime.      . TRAMADOL HCL 50 MG PO TABS Oral Take 50 mg by mouth every 6 (six) hours as needed. Maximum dose= 8 tablets per day. For pain/abdominal cramping.     . WARFARIN SODIUM 1 MG PO TABS Oral Take 1 mg by mouth daily. Take with Coumadin 6mg  to equal 7mg      . WARFARIN SODIUM 6 MG PO TABS Oral Take 6 mg by mouth daily. Take with Coumadin 1mg  to equal 7mg        BP 115/82  Pulse 67  Temp(Src) 99.5 F (37.5 C) (Oral)  Resp 16  SpO2 99%  Physical Exam  Constitutional: She is oriented to person, place, and time. No distress.  HENT:  Head: Normocephalic and atraumatic.  Eyes: Pupils are equal, round, and reactive to light.  Neck: Normal range of motion.  Cardiovascular: Normal rate, regular rhythm and normal heart sounds.   No murmur heard. Pulmonary/Chest: Effort normal and breath sounds normal. No respiratory distress. She has no wheezes. She has no rales.  Abdominal: Soft. She exhibits no distension and no mass. There is no tenderness. There is no rebound and no guarding.  Musculoskeletal:       Chronic flexure, contractures, and aplastic development of her extremities  Neurological: She is alert and oriented to person, place, and time. No cranial nerve deficit.  Skin: Skin is warm and dry. No rash noted. No erythema.  Psychiatric: She has a normal mood and affect. Her behavior is normal.    ED Course  Procedures (including critical care time)  Numbness and tingling in the lower extremities.  Over patient with cerebral palsy.  No pain anywhere.  No systemic symptoms.  We will do laboratory testing, including blood work and urinalysis.  There is no indication for a CAT scan of her head or chest x-ray.  At this time.  Labs Reviewed  CBC - Abnormal; Notable for the following:    RDW 15.6 (*)    All other components within normal limits  BASIC METABOLIC PANEL - Abnormal; Notable for the following:     Glucose, Bld 103 (*)    All other components within normal limits  URINALYSIS, ROUTINE W REFLEX MICROSCOPIC - Abnormal; Notable for the following:    APPearance TURBID (*)    Leukocytes, UA TRACE (*)  All other components within normal limits  CBC - Abnormal; Notable for the following:    RDW 15.6 (*)    All other components within normal limits  URINE MICROSCOPIC-ADD ON - Abnormal; Notable for the following:    Squamous Epithelial / LPF FEW (*)    All other components within normal limits  DIFFERENTIAL  CK   No results found.   8:55 PM Patient states that the paresthesias in her legs have resolved.  She asked whether this could be just positional.  From sitting in her wheelchair.  I told her that that would be a good explanation, because if you have pressure on the nerve or blood vessel, you can get paresthesias, and now that she is lying in the bed and her paresthesias have resolved.  That explanation as plausible   MDM  paresthesias        Nicholes Stairs, MD 11/01/11 2053  Nicholes Stairs, MD 11/01/11 2055

## 2011-11-01 NOTE — ED Notes (Signed)
From [facility] - was picked up at [facility]. C/o generalized weakness all over x 1 week.

## 2011-11-01 NOTE — ED Notes (Signed)
Pt tolerating po fluids well. Denies needs or c/o.  Assisted her with her phone to call her Dad and her nursing home.

## 2011-12-10 ENCOUNTER — Encounter (HOSPITAL_COMMUNITY): Payer: Self-pay | Admitting: Emergency Medicine

## 2011-12-10 ENCOUNTER — Emergency Department (HOSPITAL_COMMUNITY)
Admission: EM | Admit: 2011-12-10 | Discharge: 2011-12-10 | Disposition: A | Discharge status: Skilled Nursing Facility | Payer: Medicaid Other | Attending: Emergency Medicine | Admitting: Emergency Medicine

## 2011-12-10 DIAGNOSIS — K219 Gastro-esophageal reflux disease without esophagitis: Secondary | ICD-10-CM | POA: Insufficient documentation

## 2011-12-10 DIAGNOSIS — Z79899 Other long term (current) drug therapy: Secondary | ICD-10-CM | POA: Insufficient documentation

## 2011-12-10 DIAGNOSIS — R5381 Other malaise: Secondary | ICD-10-CM | POA: Insufficient documentation

## 2011-12-10 DIAGNOSIS — I10 Essential (primary) hypertension: Secondary | ICD-10-CM | POA: Insufficient documentation

## 2011-12-10 DIAGNOSIS — R531 Weakness: Secondary | ICD-10-CM

## 2011-12-10 DIAGNOSIS — R5383 Other fatigue: Secondary | ICD-10-CM | POA: Insufficient documentation

## 2011-12-10 DIAGNOSIS — G809 Cerebral palsy, unspecified: Secondary | ICD-10-CM

## 2011-12-10 DIAGNOSIS — N390 Urinary tract infection, site not specified: Secondary | ICD-10-CM | POA: Insufficient documentation

## 2011-12-10 DIAGNOSIS — Z86718 Personal history of other venous thrombosis and embolism: Secondary | ICD-10-CM | POA: Insufficient documentation

## 2011-12-10 DIAGNOSIS — Z7901 Long term (current) use of anticoagulants: Secondary | ICD-10-CM | POA: Insufficient documentation

## 2011-12-10 LAB — DIFFERENTIAL
Basophils Absolute: 0 10*3/uL (ref 0.0–0.1)
Basophils Relative: 0 % (ref 0–1)
Lymphocytes Relative: 43 % (ref 12–46)
Monocytes Relative: 9 % (ref 3–12)
Neutro Abs: 3.1 10*3/uL (ref 1.7–7.7)
Neutrophils Relative %: 45 % (ref 43–77)

## 2011-12-10 LAB — COMPREHENSIVE METABOLIC PANEL
AST: 10 U/L (ref 0–37)
Albumin: 4 g/dL (ref 3.5–5.2)
Alkaline Phosphatase: 57 U/L (ref 39–117)
CO2: 26 mEq/L (ref 19–32)
Chloride: 107 mEq/L (ref 96–112)
GFR calc non Af Amer: >90 mL/min (ref >90)
Potassium: 4.4 mEq/L (ref 3.5–5.1)
Total Bilirubin: 0.3 mg/dL (ref 0.3–1.2)

## 2011-12-10 LAB — CBC
Hemoglobin: 12.8 g/dL (ref 12.0–15.0)
MCHC: 32.9 g/dL (ref 30.0–36.0)
WBC: 6.8 10*3/uL (ref 4.0–10.5)

## 2011-12-10 LAB — URINALYSIS, ROUTINE W REFLEX MICROSCOPIC
Bilirubin Urine: NEGATIVE
Glucose, UA: NEGATIVE mg/dL
Ketones, ur: NEGATIVE mg/dL
Protein, ur: 100 mg/dL — AB

## 2011-12-10 LAB — URINE MICROSCOPIC-ADD ON

## 2011-12-10 MED ORDER — CEPHALEXIN 500 MG PO CAPS: 500.0000 mg | ORAL_CAPSULE | Freq: Four times a day (QID) | ORAL | Status: AC

## 2011-12-10 MED ORDER — CEPHALEXIN 250 MG PO CAPS
500.0000 mg | ORAL_CAPSULE | Freq: Once | ORAL | Status: AC
  Administered 2011-12-10: 500 mg via ORAL
  Filled 2011-12-10: qty 2

## 2011-12-10 MED ORDER — MORPHINE SULFATE 2 MG/ML IJ SOLN
2.0000 mg | INTRAMUSCULAR | Status: AC
  Administered 2011-12-10: 2 mg via INTRAMUSCULAR
  Filled 2011-12-10: qty 1

## 2011-12-10 MED ORDER — MORPHINE SULFATE 2 MG/ML IJ SOLN: 2.0000 mg | INTRAMUSCULAR | Status: DC
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
## 2011-12-10 NOTE — ED Notes (Signed)
Pt stated that she has been weak for a couple of months. She stated that she is unable to sit up by herself. Pt stated that she has not had any SOB, CP, N/V/D, or loss of appetite. Will continue to monitor.
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
## 2011-12-10 NOTE — ED Notes (Signed)
Lacinda Axon was called and given report. [facility] called to come take pt back home. Discharge instructions read to Ramatou, nurse.

## 2011-12-10 NOTE — ED Provider Notes (Signed)
Medical screening examination/treatment/procedure(s) were performed by non-physician practitioner and as supervising physician I was immediately available for consultation/collaboration.   Dayton Bailiff, MD 12/10/11 [phone]

## 2012-01-02 DIAGNOSIS — R109 Unspecified abdominal pain: Secondary | ICD-10-CM | POA: Insufficient documentation

## 2012-01-12 ENCOUNTER — Encounter: Payer: Self-pay | Admitting: Family Medicine

## 2012-06-17 DIAGNOSIS — G894 Chronic pain syndrome: Secondary | ICD-10-CM | POA: Insufficient documentation

## 2012-07-09 ENCOUNTER — Encounter: Payer: Self-pay | Admitting: Family Medicine

## 2012-09-17 ENCOUNTER — Encounter: Payer: Self-pay | Admitting: Family Medicine

## 2012-10-02 DIAGNOSIS — Z8742 Personal history of other diseases of the female genital tract: Secondary | ICD-10-CM

## 2012-10-02 HISTORY — DX: Personal history of other diseases of the female genital tract: Z87.42

## 2012-10-21 DIAGNOSIS — F411 Generalized anxiety disorder: Secondary | ICD-10-CM | POA: Insufficient documentation

## 2012-10-21 DIAGNOSIS — Z86711 Personal history of pulmonary embolism: Secondary | ICD-10-CM | POA: Insufficient documentation

## 2012-10-21 DIAGNOSIS — Z7901 Long term (current) use of anticoagulants: Secondary | ICD-10-CM | POA: Insufficient documentation

## 2012-10-21 DIAGNOSIS — Z9229 Personal history of other drug therapy: Secondary | ICD-10-CM | POA: Insufficient documentation

## 2012-10-21 DIAGNOSIS — F329 Major depressive disorder, single episode, unspecified: Secondary | ICD-10-CM | POA: Insufficient documentation

## 2012-10-21 DIAGNOSIS — F32A Depression, unspecified: Secondary | ICD-10-CM | POA: Insufficient documentation

## 2012-10-21 DIAGNOSIS — Z8669 Personal history of other diseases of the nervous system and sense organs: Secondary | ICD-10-CM | POA: Insufficient documentation

## 2012-10-27 HISTORY — PX: LAPAROSCOPIC ASSISTED VAGINAL HYSTERECTOMY: SHX5398

## 2013-02-05 DIAGNOSIS — N319 Neuromuscular dysfunction of bladder, unspecified: Secondary | ICD-10-CM | POA: Insufficient documentation

## 2013-02-16 ENCOUNTER — Other Ambulatory Visit: Payer: Self-pay | Admitting: *Deleted

## 2013-02-16 MED ORDER — OXYCODONE-ACETAMINOPHEN 5-325 MG PO TABS: ORAL_TABLET | ORAL | Status: DC

## 2013-03-02 ENCOUNTER — Encounter: Payer: Self-pay | Admitting: Family Medicine

## 2013-03-02 ENCOUNTER — Non-Acute Institutional Stay (INDEPENDENT_AMBULATORY_CARE_PROVIDER_SITE_OTHER): Payer: Medicaid Other | Admitting: Family Medicine

## 2013-03-02 DIAGNOSIS — F3289 Other specified depressive episodes: Secondary | ICD-10-CM

## 2013-03-02 DIAGNOSIS — F329 Major depressive disorder, single episode, unspecified: Secondary | ICD-10-CM

## 2013-03-02 DIAGNOSIS — I10 Essential (primary) hypertension: Secondary | ICD-10-CM

## 2013-03-02 MED ORDER — POLYETHYLENE GLYCOL 3350 17 GM/SCOOP PO POWD: 17.0000 g | Freq: Two times a day (BID) | ORAL | Status: DC

## 2013-03-02 MED ORDER — BUPROPION HCL ER (XL) 150 MG PO TB24: 150.0000 mg | ORAL_TABLET | Freq: Every day | ORAL | Status: DC

## 2013-03-02 MED ORDER — ACETIC ACID 2 % OT SOLN: 4.0000 [drp] | OTIC | Status: DC

## 2013-03-02 MED ORDER — PSEUDOEPHEDRINE HCL 30 MG PO TABS: 30.0000 mg | ORAL_TABLET | Freq: Four times a day (QID) | ORAL | Status: DC | PRN

## 2013-03-02 MED ORDER — PHENAZOPYRIDINE HCL 200 MG PO TABS: 200.0000 mg | ORAL_TABLET | Freq: Three times a day (TID) | ORAL | Status: DC | PRN

## 2013-03-02 MED ORDER — METOPROLOL SUCCINATE ER 25 MG PO TB24: 25.0000 mg | ORAL_TABLET | Freq: Every day | ORAL | Status: DC

## 2013-03-02 NOTE — Progress Notes (Signed)
Patient ID: [name] [name], female   DOB: 05-28-1976, 37 y.o.   MRN: [id number]  Family Medicine Teaching [facility] Admission History and Physical Service Pager: [phone]  Patient name: [name] [name] Medical record number: [id number] Date of birth: 11/07/1976 Age: 37 y.o. Gender: female  Primary Care Provider: Demetria Pore, MD  Assessment and Plan: [name] [name] is a 37 y.o. year old female presents to [facility] as a transfer from outside nursing home facility.  She has a hx of cerebral palsy, major depression, OCD, hx recurrent pulmonary embolism, urinary retention.  1. Cerebral Palsy with contractures of multiple joints: Diagnosed when she was 37 years old.  Patient denies any pain at this time. 1. Continue Baclofen PO daily and Zanaflex daily; we do not manage baclofen pumps here 2. Continue Percocet as needed for pain and Colace for constipation 3. Continue Motrin as needed for pain 2. Urinary retention: Suprapubic catheter in place, but we are unsure if it is functioning.  There is urine in foley, but RN said she was unable to flush it.  She has a urology appointment tomorrow 4/2 at  10:45 AM and she plans on taking the [facility] bus to her appointment.   1. If she complains of urinary retention, plan to place regular foley cath temporarily 2. Holding Ditropan for now, but will likely restart if okay with Urology 3. Will follow up Urology recommendations 3. Long term anticoagulation for recurrent pulmonary embolism:  1. Continue Xarelto daily 4. Migraine: She denies any HA at this time 1. Imitrex ordered as needed 5. Hypertension: BP low 91/63.   1. HOLD Toprol 25 daily. 2. Recheck in AM. 6. Psychiatric disorders: Patient has a history of major depression and anxiety and likely bipolar disorder 1. Continue Wellbutrin, Klonopin, Seroquel, Zoloft, and Lamictal per home regimen 2. She does not appear to be drowsy and is alert and cooperate during exam, but will monitor for any  signs of sedation 3. Unclear if patient has a psychiatrist, but we could consider consulting one if she does not 7. OCD: She is on multiple psych medications and OCD appears to be well controlled 1. Continue Wellbutrin, Zoloft, and Klonopin per home regimen 2. Will monitor mental status closely, monitoring for any potential drug interactions 8. FEN/GI: PO ad lib 9. Prophylaxis: Long term Xarelto for PE 10. Disposition: Long term nursing home care - will contact family this week with updates 11. Code Status:  Full  History of Present Illness: [name] [name] is a 37 y.o. year old female with a history of cerebral palsy with urinary retention and suprapubic catheter and psychiatric disorders.   She is a transfer from [facility] and is former patient at [facility] last seen in 2012.  Patient was not satisfied with her care at last Nursing Home and decided to move here for long-term care.  Patient has no complaints of joint pain, HA, CP, or shortness of breath at this time.  She does endorse "stomach ache" and is concerned the suprapubic catheter that was placed in the hospital a few weeks ago is not working.  She has an appointment with Urology tomorrow morning.  Otherwise, patient is doing well.  She has a brother who will be coming to visit her today.  Her mother, father, and 2 children also live in [place].                 Patient Active Problem List  Diagnosis  . DEPRESSION, MAJOR, RECURRENT  . OBSESSIVE COMPUL. DISORDER  .  CEREBRAL PALSY  . HYPERTENSION, BENIGN  . DYSFUNCTIONAL UTERINE BLEEDING  . Contracture of joint of multiple sites  . WRIST PAIN, LEFT  . Long term (current) use of anticoagulants  . Seborrheic keratosis  . Dyspnea  . Urinary retention  . Recurrent pulmonary embolism  . IUD (intrauterine device) in place  . Nausea  . Abdominal cramping   Past Medical History: Past Medical History  Diagnosis Date  . Cerebral palsy   . Pulmonary embolism     Lifetime  Coumadin  . Cerebral palsy     Spastic Cerebral palsy, mentally intact  . Contracture, joint, multiple sites     Electric wheelchair, uses left hand to operate chair.   [name] Kitchen Hypertension   . OCD (obsessive compulsive disorder)   . Depression   . Migraine   . [name]   . Dysarthria   . Endometriosis   . History of recurrent UTIs   . GERD (gastroesophageal reflux disease)   . Pulmonary embolism 2011, 01/2011    Will be on lifetime coumadin  . Anemia    Past Surgical History: Past Surgical History  Procedure Laterality Date  . Colposcopy  06/2000  . Cesarean section    . Tubal ligation  2003  . Carpal tunnel release  08/2008    Dr Teressa Senter  . Wrist surgery  06/2010    Dr Dierdre Searles, hand surgeon, [name]  . Baclofen pump refill      x 3 times  . Appendectomy    . Intrauterine device insertion  04/30/11    Inserted by [facility] GYN for endometriosis   Social History: History  Substance Use Topics  . Smoking status: Never Smoker   . Smokeless tobacco: Never Used  . Alcohol Use: No   For any additional social history documentation, please refer to relevant sections of EMR.  Family History: Family History  Problem Relation Age of Onset  . Asthma Father   . Cancer Maternal Grandmother   . Cancer Paternal Grandmother    Allergies: No Known Allergies Current Outpatient Prescriptions on File Prior to Visit  Medication Sig Dispense Refill  . ARIPiprazole (ABILIFY) 10 MG tablet Take 10 mg by mouth at bedtime.        . baclofen (LIORESAL) 10 MG tablet Take 10 mg by mouth 4 (four) times daily. Take at 0600, 1200, 1800 & at bedtime       . clonazePAM (KLONOPIN) 1 MG tablet Take 1 mg by mouth 4 (four) times daily.       [name] Kitchen ibuprofen (ADVIL,MOTRIN) 400 MG tablet Take 400 mg by mouth every 8 (eight) hours as needed. For pain       . lamoTRIgine (LAMICTAL) 200 MG tablet Take 400 mg by mouth daily.        [name] Kitchen loratadine (CLARITIN) 10 MG tablet Take 10 mg by mouth at  bedtime.        . metoprolol (TOPROL-XL) 100 MG 24 hr tablet Take 100 mg by mouth every evening.        [name] Kitchen oxyCODONE-acetaminophen (ROXICET) 5-325 MG per tablet Take one tablet every 4 hours as needed for pain  120 tablet  0  . QUEtiapine (SEROQUEL) 100 MG tablet Take 100 mg by mouth at bedtime.        . sertraline (ZOLOFT) 100 MG tablet Take 200 mg by mouth at bedtime.        . SUMAtriptan (IMITREX) 100 MG tablet Take 100 mg by mouth every 2 (  two) hours as needed. Take every 2 hours x2 doses. For migraines.       [name] Kitchen tiZANidine (ZANAFLEX) 4 MG tablet Take 4 mg by mouth at bedtime.        . topiramate (TOPAMAX) 100 MG tablet Take 100 mg by mouth at bedtime.        . traMADol (ULTRAM) 50 MG tablet Take 50 mg by mouth every 6 (six) hours as needed. Maximum dose= 8 tablets per day. For pain/abdominal cramping.       . warfarin (COUMADIN) 1 MG tablet Take 1 mg by mouth daily. Take with Coumadin 6mg  to equal 7mg        . warfarin (COUMADIN) 6 MG tablet Take 6 mg by mouth daily. Take with Coumadin 1mg  to equal 7mg         No current facility-administered medications on file prior to visit.   Review Of Systems: Per HPI   Physical Exam: Exam: General: pleasant, laying in bed comfortably, in no acute distress HEENT: EOMI.  PERRLA.  Oropharynx moist and clear.  Neck supple. Cardiovascular: RRR, no M/R/G Respiratory: CTAB, no wheezes, rales, or rhonchi Abdomen: soft, NT, ND, + BS; baclofen pump palpable RLQ; suprapubic catheter in place Extremities: contractures of upper extremities, muscle atrophy present bilateral lower extremities Skin: old incision site where suprapubic catheter sits appears mildly erythematous, but no open wounds or pus or drainage Neuro: AAO, unable to stand patient up, contractures of upper extremities but 4/5 motor strength, she does want to move her legs bilaterally, normal sensation  DE LA CRUZ,Ollie Esty, DO 03/02/2013, 4:45 PM

## 2013-03-02 NOTE — Progress Notes (Deleted)
  Subjective:    Patient ID: [name] [name], female    DOB: 1976/07/26, 37 y.o.   MRN: [id number]  HPI    Review of Systems Per HPI    Objective:   Physical Exam        Assessment & Plan:

## 2013-03-03 ENCOUNTER — Other Ambulatory Visit: Payer: Self-pay | Admitting: Family Medicine

## 2013-03-03 MED ORDER — CLONAZEPAM 1 MG PO TABS: 1.0000 mg | ORAL_TABLET | Freq: Four times a day (QID) | ORAL | Status: DC

## 2013-03-05 ENCOUNTER — Non-Acute Institutional Stay: Payer: Medicaid Other | Admitting: Family Medicine

## 2013-03-05 DIAGNOSIS — G809 Cerebral palsy, unspecified: Secondary | ICD-10-CM

## 2013-03-05 DIAGNOSIS — R319 Hematuria, unspecified: Secondary | ICD-10-CM

## 2013-03-05 DIAGNOSIS — Z9359 Other cystostomy status: Secondary | ICD-10-CM

## 2013-03-05 DIAGNOSIS — Z9350 Unspecified cystostomy status: Secondary | ICD-10-CM

## 2013-03-08 ENCOUNTER — Ambulatory Visit (INDEPENDENT_AMBULATORY_CARE_PROVIDER_SITE_OTHER): Payer: Medicaid Other | Admitting: Pediatrics

## 2013-03-08 DIAGNOSIS — G808 Other cerebral palsy: Secondary | ICD-10-CM

## 2013-03-08 NOTE — Patient Instructions (Addendum)
Find out the name of your Dr. at [facility] so that I can send today's note.

## 2013-03-08 NOTE — Progress Notes (Signed)
Patient: [name] [name] MRN: [id number] Sex: female DOB: Sep 08, 1976  Provider: Deetta Perla, MD Location of Care: [facility]  Note type: Routine return visit, empty, refill, and reprogram intrathecal baclofen pump  History of Present Illness: Referral Source: Demetria Pore, M.D. 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 female referred for evaluation of Imaging refilling and reprogramming baclofen pump and routine followup..  The patient wants to know if there's anything that can be done to improve the strength in her trunk and her head and neck.  I told her that she would need to have daily physical therapy at her new skilled nursing facility.  Not certain that she would improve, but I believe that she is deconditioned.  Today she is showing more mobility in her legs and better tone but I have seen in quite some time.  Procedure: emptying, refilling, and reprogramming her intrathecal baclofen pump.  The device was interrogated and showed a complex continuous infusion of baclofen that has a basal rate of 13.5  mcg per hour. The patient receives bolus doses of 25 mcg delivered at midnight, 6 AM, 12 noon, and 6 PM, for total of 428.0 mcg per day.  The patient was sterilely prepped and draped.  A 1-1/2 inch 22-gauge noncoring Huebner needle was inserted on the 1st pass.  4.5 mL was withdrawn from the pump placing it under partial vacuum.  40 mL of baclofen (concentration 2000 mcg/mL) was instilled into the pump through a Millipore filter.  The patient tolerated the procedure well.  Review of Systems: 12 system review was remarkable for generalized weakness,  neurogenic bladder, dysarthria, significant weight loss, depression/ anxiety, deep vein thrombosis, nonepileptic seizures  Past Medical History  Diagnosis Date  . Cerebral palsy   . Pulmonary embolism     Lifetime Coumadin   . Cerebral palsy     Spastic Cerebral palsy, mentally intact  . Contracture, joint, multiple sites     Electric wheelchair, uses left hand to operate chair.   [name] Kitchen Hypertension   . OCD (obsessive compulsive disorder)   . Depression   . Migraine   . [name]   . Dysarthria   . Endometriosis   . History of recurrent UTIs   . GERD (gastroesophageal reflux disease)   . Pulmonary embolism 2011, 01/2011    Will be on lifetime coumadin  . Anemia    Hospitalizations: yes, Head Injury: yes, Nervous System Infections: no, Immunizations up to date: yes Past Medical History Comments: See history of present illness.  Birth History Extremely premature infant. Behavior History depression, anxiety,  obsessive-compulsive disorder  Surgical History Past Surgical History  Procedure Laterality Date  . Colposcopy  06/2000  . Cesarean section    . Tubal ligation  2003  . Carpal tunnel release  08/2008    Dr Teressa Senter  . Wrist surgery  06/2010    Dr Dierdre Searles, hand surgeon, [name]  . Baclofen pump refill      x 3 times  . Appendectomy    . Intrauterine device insertion  04/30/11    Inserted by [facility] for endometriosis   Family History family history includes Asthma in her father and Cancer in her maternal grandmother and paternal grandmother. Family History is negative migraines, seizures, cognitive impairment, blindness, deafness, birth defects, chromosomal disorder, autism.  Social History History   Social History  . Marital Status: Divorced    Spouse  Name: N/A    Number of Children: 2  . Years of Education: N/A   Occupational History  .     Social History Main Topics  . Smoking status: Never Smoker   . Smokeless tobacco: Never Used  . Alcohol Use: No  . Drug Use: No  . Sexually Active: No   Other Topics Concern  . Not on file   Social History Narrative   Ailene Ards, who has schizophrenia, MR, is father of daughter.  Homero Fellers and pt are no longer  together.  Homero Fellers does not see [name]. Daughter is 78 yo, Stage manager, lives with pt.        [name] [phone]), lives with pt's parents.      Needs assistance with ADL's and IADL's--has personal care services 20 hrs/wk;       No tobacco, EtOH, drugs.      **Patient very concerned about her kids being taken from her.      **Case Manager: Jack Quarto [phone]   Educational level graduate from high school.Courses in community college  Living in skilled nursing facility  Hobbies/Interest: none    Current Outpatient Prescriptions on File Prior to Visit  Medication Sig Dispense Refill  . acetic acid (VOSOL) 2 % otic solution Place 4 drops into both ears once a week.  15 mL  0  . ARIPiprazole (ABILIFY) 10 MG tablet Take 10 mg by mouth at bedtime.        . baclofen (LIORESAL) 10 MG tablet Take 10 mg by mouth 4 (four) times daily. Take at 0600, 1200, 1800 & at bedtime       . buPROPion (WELLBUTRIN XL) 150 MG 24 hr tablet Take 1 tablet (150 mg total) by mouth daily.      . clonazePAM (KLONOPIN) 1 MG tablet Take 1 tablet (1 mg total) by mouth 4 (four) times daily.  30 tablet  2  . ibuprofen (ADVIL,MOTRIN) 400 MG tablet Take 400 mg by mouth every 8 (eight) hours as needed. For pain       . lamoTRIgine (LAMICTAL) 200 MG tablet Take 400 mg by mouth daily.        [name] Kitchen loratadine (CLARITIN) 10 MG tablet Take 10 mg by mouth at bedtime.        . metoprolol (TOPROL-XL) 100 MG 24 hr tablet Take 100 mg by mouth every evening.        . metoprolol succinate (TOPROL-XL) 25 MG 24 hr tablet Take 1 tablet (25 mg total) by mouth daily.  90 tablet  3  . oxyCODONE-acetaminophen (ROXICET) 5-325 MG per tablet Take one tablet every 4 hours as needed for pain  120 tablet  0  . phenazopyridine (PYRIDIUM) 200 MG tablet Take 1 tablet (200 mg total) by mouth 3 (three) times daily as needed for pain.  10 tablet  0  . polyethylene glycol powder (GLYCOLAX/MIRALAX) powder Take 17 g by mouth 2 (two) times daily.  3350 g  1  .  pseudoephedrine (SUDAFED) 30 MG tablet Take 1 tablet (30 mg total) by mouth every 6 (six) hours as needed for congestion.  30 tablet  0  . QUEtiapine (SEROQUEL) 100 MG tablet Take 100 mg by mouth at bedtime.        . sertraline (ZOLOFT) 100 MG tablet Take 200 mg by mouth at bedtime.        . SUMAtriptan (IMITREX) 100 MG tablet Take 100 mg by mouth every 2 (two) hours as needed. Take every 2 hours  x2 doses. For migraines.       [name] Kitchen tiZANidine (ZANAFLEX) 4 MG tablet Take 4 mg by mouth at bedtime.        . topiramate (TOPAMAX) 100 MG tablet Take 100 mg by mouth at bedtime.        . traMADol (ULTRAM) 50 MG tablet Take 50 mg by mouth every 6 (six) hours as needed. Maximum dose= 8 tablets per day. For pain/abdominal cramping.       . warfarin (COUMADIN) 1 MG tablet Take 1 mg by mouth daily. Take with Coumadin 6mg  to equal 7mg        . warfarin (COUMADIN) 6 MG tablet Take 6 mg by mouth daily. Take with Coumadin 1mg  to equal 7mg         No current facility-administered medications on file prior to visit.   The medication list was reviewed and reconciled. All changes or newly prescribed medications were explained.  A complete medication list was provided to the patient/caregiver.  No Known Allergies  Physical Exam There were no vitals taken for this visit.  General:  awake alert woman in manual wheelchair. She is unable to maintain her head upright. She has a strap across her chest, and a band around her head keeping her up right.,  Brown hair, brown eyes, right-handed Head:  normocephalic, no dysmorphic features Ears, Nose and Throat:  tympanic membranes and pharynx show no signs of infection, poor dentition Neck:  supple no cranial or cervical bruits. Unable to hold her head midline Respiratory: lungs clear to auscultation Cardiovascular: regular rate rhythm, no murmurs Musculoskeletal:  spastic quadriparesis and dystonia Skin:  no rashes Trunk:  abdomen bowel sounds normal, baclofen pump in the  right lower quadrant  Neurologic Exam  Mental Status: alert, seated in wheelchair Cranial Nerves: round minimally reactive pupils,  conjugate eye movement, dystonic but symmetric facial strength, difficulty protruding her tongue, severe dysarthria to the point of being in unintelligible at times Motor: right arm in shoulder anterior rotation, pronation, elbow flexion, wrist and finger flexion, when extended, she has uncontrollable right shoulder posterior extension.  Left arm able to have antigravity movement, stayed in left elbow pronation. The left hand was fisted but she was able to open and close her left hand extending and flexing her fingers slowly and with considerable effort. She could not perform any intentional grasping or pushing movements with the hand or fingers. She could perform minimal pulling movements and was weaker distally than proximally in the left extremity. There was dystonic movments in her hand when she attempted to perform activities.Her legs showed less tone in I have seen in years.  I was able move them fairly easily.  She had some pain in her right hip with figure four.She is able to extend her legs on her own and push on my hands and both feet.  She has significant weakness in foot dorsiflexion.  She is able to flex her knees with mild weakness. Sensory: Withdrawal x4 Coordination:  poor coordination, no tremor Gait and Station: spastic diplegia, in wheelchair Reflexes: diminished but due to cocontraction, no clonus; she has bilateral extensor plantar responses  Assessment and Plan  1.  Spastic quadriparesis from extreme prematurity (343.2)  Plan:  I emptied, refilled, and reprogrammed her intrathecal pump.  She will be scheduled for refill in 177 days or September 01, 2013.  I'll be happy to see her sooner depending upon clinical need.  She needs daily physical therapy if she is ever going to become  more mobile and more independent in her activities of daily living as  well as ambulate.  She is quite deconditioned.  She has been very depressed and this is significantly affected her ability to motivate herself.  I hope in new surroundings, that she can become more active.  Meds ordered this encounter  Medications  . acetic acid 0.25 % irrigation    Sig: Irrigate once weekly with 1/2 water and 1/2 acetic acid  . bisacodyl (DULCOLAX) 10 MG suppository    Sig: 10 mg. Place 10 mg rectally as needed.  . Multiple Vitamin (MULTIVITAMIN) capsule    Sig: 1 capsule. Take 1 capsule by mouth daily.  [name] Kitchen oxybutynin (DITROPAN) 5 MG tablet    Sig: 5 mg. Take 5 mg by mouth 3 times daily.  . Rivaroxaban (XARELTO) 20 MG TABS    Sig: 20 mg. Take 20 mg by mouth daily.  [name] Kitchen acetic acid (VOSOL) 2 % otic solution    Sig: Place in ear(s). Place 4 drops into both ears once a week.  . ARIPiprazole (ABILIFY) 10 MG tablet    Sig: 10 mg. Take 10 mg by mouth daily.  . baclofen (LIORESAL) 10 MG tablet    Sig: 10 mg. Take 10 mg by mouth 4 times daily.  [name] Kitchen buPROPion (WELLBUTRIN XL) 150 MG 24 hr tablet    Sig: Take by mouth. Take 1 tablet (150 mg total) by mouth daily.  . clonazePAM (KLONOPIN) 1 MG tablet    Sig: Take by mouth. Take 1 tablet (1 mg total) by mouth 4 (four) times daily.  [name] Kitchen ibuprofen (ADVIL,MOTRIN) 400 MG tablet    Sig: 400 mg. Take 400 mg by mouth every 6 (six) hours as needed.  . lamoTRIgine (LAMICTAL) 200 MG tablet    Sig: 200 mg. Take 200 mg by mouth daily.  [name] Kitchen loratadine (CLARITIN) 10 MG tablet    Sig: 10 mg. Take 10 mg by mouth daily.  . metoprolol succinate (TOPROL-XL) 25 MG 24 hr tablet    Sig: Take by mouth. Take 1 tablet (25 mg total) by mouth daily.  . phenazopyridine (PYRIDIUM) 200 MG tablet    Sig: Take by mouth. Take 1 tablet (200 mg total) by mouth 3 (three) times daily as needed for pain.  . polyethylene glycol powder (GLYCOLAX/MIRALAX) powder    Sig: Take by mouth. Take 17 g by mouth 2 (two) times daily.  . polyethylene glycol (MIRALAX / GLYCOLAX)  packet    Sig: Take 17 g by mouth 2 times daily.  . pseudoephedrine (SUDAFED) 30 MG tablet    Sig: Take by mouth. Take 1 tablet (30 mg total) by mouth every 6 (six) hours as needed for congestion.  . QUEtiapine (SEROQUEL) 100 MG tablet    Sig: 100 mg. Take 100 mg by mouth nightly.  . sertraline (ZOLOFT) 100 MG tablet    Sig: 200 mg. Take 200 mg by mouth nightly.  . SUMAtriptan (IMITREX) 100 MG tablet    Sig: 100 mg. Take 100 mg by mouth as needed for Migraine.  [name] Kitchen tiZANidine (ZANAFLEX) 4 MG tablet    Sig: 4 mg. Take 4 mg by mouth daily.  [name] Kitchen topiramate (TOPAMAX) 100 MG tablet    Sig: 100 mg. Take 100 mg by mouth nightly.  . traMADol (ULTRAM) 50 MG tablet    Sig: 50 mg. Take 1 tablet (50 mg total) by mouth every 6 (six) hours as needed for 10 days for Pain.    Deetta Perla MD

## 2013-03-09 ENCOUNTER — Encounter: Payer: Self-pay | Admitting: Pediatrics

## 2013-03-10 ENCOUNTER — Observation Stay (HOSPITAL_COMMUNITY)
Admission: AD | Admit: 2013-03-10 | Discharge: 2013-03-12 | Disposition: A | Discharge status: Skilled Nursing Facility | Payer: Medicaid Other | Source: Ambulatory Visit | Attending: Family Medicine | Admitting: Family Medicine

## 2013-03-10 ENCOUNTER — Encounter (HOSPITAL_COMMUNITY): Payer: Self-pay | Admitting: *Deleted

## 2013-03-10 ENCOUNTER — Non-Acute Institutional Stay (INDEPENDENT_AMBULATORY_CARE_PROVIDER_SITE_OTHER): Payer: Self-pay | Admitting: Family Medicine

## 2013-03-10 DIAGNOSIS — Z7901 Long term (current) use of anticoagulants: Secondary | ICD-10-CM | POA: Insufficient documentation

## 2013-03-10 DIAGNOSIS — B3731 Acute candidiasis of vulva and vagina: Secondary | ICD-10-CM | POA: Insufficient documentation

## 2013-03-10 DIAGNOSIS — N898 Other specified noninflammatory disorders of vagina: Secondary | ICD-10-CM

## 2013-03-10 DIAGNOSIS — R109 Unspecified abdominal pain: Secondary | ICD-10-CM

## 2013-03-10 DIAGNOSIS — I10 Essential (primary) hypertension: Secondary | ICD-10-CM

## 2013-03-10 DIAGNOSIS — G8929 Other chronic pain: Secondary | ICD-10-CM | POA: Insufficient documentation

## 2013-03-10 DIAGNOSIS — Z86711 Personal history of pulmonary embolism: Secondary | ICD-10-CM | POA: Insufficient documentation

## 2013-03-10 DIAGNOSIS — R310 Gross hematuria: Secondary | ICD-10-CM

## 2013-03-10 DIAGNOSIS — F339 Major depressive disorder, recurrent, unspecified: Secondary | ICD-10-CM | POA: Insufficient documentation

## 2013-03-10 DIAGNOSIS — G808 Other cerebral palsy: Secondary | ICD-10-CM | POA: Insufficient documentation

## 2013-03-10 DIAGNOSIS — F429 Obsessive-compulsive disorder, unspecified: Secondary | ICD-10-CM | POA: Insufficient documentation

## 2013-03-10 DIAGNOSIS — N949 Unspecified condition associated with female genital organs and menstrual cycle: Secondary | ICD-10-CM | POA: Insufficient documentation

## 2013-03-10 DIAGNOSIS — Z9359 Other cystostomy status: Secondary | ICD-10-CM

## 2013-03-10 DIAGNOSIS — M2450 Contracture, unspecified joint: Secondary | ICD-10-CM

## 2013-03-10 DIAGNOSIS — B373 Candidiasis of vulva and vagina: Secondary | ICD-10-CM | POA: Insufficient documentation

## 2013-03-10 DIAGNOSIS — N319 Neuromuscular dysfunction of bladder, unspecified: Secondary | ICD-10-CM | POA: Insufficient documentation

## 2013-03-10 DIAGNOSIS — Z9350 Unspecified cystostomy status: Secondary | ICD-10-CM | POA: Insufficient documentation

## 2013-03-10 HISTORY — DX: Gross hematuria: R31.0

## 2013-03-10 LAB — CBC
HCT: 28.5 % — ABNORMAL LOW (ref 36.0–46.0)
Hemoglobin: 9.5 g/dL — ABNORMAL LOW (ref 12.0–15.0)
MCHC: 33.3 g/dL (ref 30.0–36.0)
MCV: 89.9 fL (ref 78.0–100.0)
RDW: 13.6 % (ref 11.5–15.5)

## 2013-03-10 LAB — MRSA PCR SCREENING: MRSA by PCR: POSITIVE — AB

## 2013-03-10 LAB — BASIC METABOLIC PANEL
BUN: 12 mg/dL (ref 6–23)
CO2: 22 mEq/L (ref 19–32)
Chloride: 105 mEq/L (ref 96–112)
Creatinine, Ser: 0.51 mg/dL (ref 0.50–1.10)
GFR calc Af Amer: >90 mL/min (ref >90)
Glucose, Bld: 211 mg/dL — ABNORMAL HIGH (ref 70–99)
Potassium: 3.5 mEq/L (ref 3.5–5.1)

## 2013-03-10 MED ORDER — CLONAZEPAM 1 MG PO TABS
1.0000 mg | ORAL_TABLET | Freq: Four times a day (QID) | ORAL | Status: DC
  Administered 2013-03-10 – 2013-03-12 (×8): 1 mg via ORAL
  Filled 2013-03-10 (×8): qty 1

## 2013-03-10 MED ORDER — PHENAZOPYRIDINE HCL 200 MG PO TABS
200.0000 mg | ORAL_TABLET | Freq: Three times a day (TID) | ORAL | Status: DC | PRN
  Filled 2013-03-10 (×2): qty 1

## 2013-03-10 MED ORDER — METOPROLOL SUCCINATE ER 25 MG PO TB24
25.0000 mg | ORAL_TABLET | Freq: Every day | ORAL | Status: DC
  Filled 2013-03-10: qty 1

## 2013-03-10 MED ORDER — BACLOFEN 10 MG PO TABS
10.0000 mg | ORAL_TABLET | Freq: Four times a day (QID) | ORAL | Status: DC
  Administered 2013-03-10 – 2013-03-12 (×8): 10 mg via ORAL
  Filled 2013-03-10 (×10): qty 1

## 2013-03-10 MED ORDER — IBUPROFEN 400 MG PO TABS
400.0000 mg | ORAL_TABLET | Freq: Three times a day (TID) | ORAL | Status: DC | PRN
  Administered 2013-03-12: 400 mg via ORAL
  Filled 2013-03-10 (×2): qty 1

## 2013-03-10 MED ORDER — LAMOTRIGINE 200 MG PO TABS
400.0000 mg | ORAL_TABLET | Freq: Every day | ORAL | Status: DC
  Administered 2013-03-10 – 2013-03-12 (×3): 400 mg via ORAL
  Filled 2013-03-10 (×3): qty 2

## 2013-03-10 MED ORDER — POLYETHYLENE GLYCOL 3350 17 GM/SCOOP PO POWD
17.0000 g | Freq: Two times a day (BID) | ORAL | Status: DC
  Filled 2013-03-10: qty 255

## 2013-03-10 MED ORDER — BUPROPION HCL ER (XL) 150 MG PO TB24
150.0000 mg | ORAL_TABLET | Freq: Every day | ORAL | Status: DC
  Administered 2013-03-10 – 2013-03-12 (×3): 150 mg via ORAL
  Filled 2013-03-10 (×3): qty 1

## 2013-03-10 MED ORDER — MUPIROCIN 2 % EX OINT
1.0000 "application " | TOPICAL_OINTMENT | Freq: Two times a day (BID) | CUTANEOUS | Status: DC
  Administered 2013-03-10 – 2013-03-12 (×4): 1 via NASAL
  Filled 2013-03-10: qty 22

## 2013-03-10 MED ORDER — TIZANIDINE HCL 4 MG PO TABS
4.0000 mg | ORAL_TABLET | Freq: Every day | ORAL | Status: DC
  Administered 2013-03-10 – 2013-03-11 (×2): 4 mg via ORAL
  Filled 2013-03-10 (×3): qty 1

## 2013-03-10 MED ORDER — POLYETHYLENE GLYCOL 3350 17 G PO PACK
17.0000 g | PACK | Freq: Two times a day (BID) | ORAL | Status: DC
  Administered 2013-03-11 – 2013-03-12 (×3): 17 g via ORAL
  Filled 2013-03-10 (×5): qty 1

## 2013-03-10 MED ORDER — OXYCODONE-ACETAMINOPHEN 5-325 MG PO TABS
1.0000 | ORAL_TABLET | ORAL | Status: DC | PRN
  Administered 2013-03-10 – 2013-03-12 (×8): 1 via ORAL
  Filled 2013-03-10 (×8): qty 1

## 2013-03-10 MED ORDER — TOPIRAMATE 100 MG PO TABS
100.0000 mg | ORAL_TABLET | Freq: Every day | ORAL | Status: DC
  Administered 2013-03-10 – 2013-03-11 (×2): 100 mg via ORAL
  Filled 2013-03-10 (×3): qty 1

## 2013-03-10 MED ORDER — TRAMADOL HCL 50 MG PO TABS
50.0000 mg | ORAL_TABLET | Freq: Four times a day (QID) | ORAL | Status: DC | PRN
  Filled 2013-03-10 (×2): qty 1

## 2013-03-10 MED ORDER — SERTRALINE HCL 100 MG PO TABS
200.0000 mg | ORAL_TABLET | Freq: Every day | ORAL | Status: DC
  Administered 2013-03-10 – 2013-03-11 (×2): 200 mg via ORAL
  Filled 2013-03-10 (×3): qty 2

## 2013-03-10 MED ORDER — CHLORHEXIDINE GLUCONATE CLOTH 2 % EX PADS
6.0000 | MEDICATED_PAD | Freq: Every day | CUTANEOUS | Status: DC
  Administered 2013-03-11 – 2013-03-12 (×2): 6 via TOPICAL

## 2013-03-10 MED ORDER — ARIPIPRAZOLE 10 MG PO TABS
10.0000 mg | ORAL_TABLET | Freq: Every day | ORAL | Status: DC
  Administered 2013-03-10 – 2013-03-11 (×2): 10 mg via ORAL
  Filled 2013-03-10 (×3): qty 1

## 2013-03-10 MED ORDER — SODIUM CHLORIDE 0.9 % IV SOLN
INTRAVENOUS | Status: DC
  Administered 2013-03-10: 18:00:00 via INTRAVENOUS

## 2013-03-10 MED ORDER — QUETIAPINE FUMARATE 100 MG PO TABS
100.0000 mg | ORAL_TABLET | Freq: Every day | ORAL | Status: DC
  Administered 2013-03-10 – 2013-03-11 (×2): 100 mg via ORAL
  Filled 2013-03-10 (×3): qty 1

## 2013-03-10 NOTE — H&P (Signed)
I examined this patient and discussed the care plan with Dr Clinton Sawyer and the [facility] team and agree with assessment and plan as documented in the progress note above. Additionally, she has a lower midline cystostomy catheter site without signs of external infection. Despite her severe quadriparesis, she speaks clearly and appropriately answers questions. If she has prolonged bleeding in her urine, we will seek urology consultation for possible bladder irrigation.

## 2013-03-10 NOTE — Progress Notes (Signed)
  Subjective:    Patient ID: [name] [name], female    DOB: 06-06-1976, 37 y.o.   MRN: [id number]  HPI Ms Bayon had her suprapubic cystostomy tube replaced and a bladder wall botox injection in [facility] last week by Dr Logan Bores and returned to [facility] on Xarelto. Yesterday her urine was normal yellow color, but this morning she began draining dark red urine in a normal volume and complaining of lower abdominal pain. Her systolic blood pressure was 116 at that time and we sent urine for UA and culture and ordered Zosyn and a CBC. The Xarelto had been given, but was ordered to hold further doses. This afternoon she began having blood clots in her diaper despite having had a hysterectomy. She continues lower abdominal pain and her systolic blood pressure is 87/64 and pulse 94.    Review of Systems     Objective:   Physical Exam  Cardiovascular: Normal rate and regular rhythm.   Pulmonary/Chest: Effort normal and breath sounds normal. She has no rales.  Lower back tenderness to percussion  Abdominal:  Soft non-tender upper quadrants Tender with guarding lower abdomen with suprapubic tube in place without signs of cellulitis  Neurological: She is alert.  Spastic quadriplegia from birth Intelligible cogent responses to questions.           Assessment & Plan:

## 2013-03-10 NOTE — Progress Notes (Signed)
[name] [name] [id number] Code Status: full Admission Data: 03/10/2013 5:37 PM Attending Provider:  hale FAO:ZHYQ, STEPHANIE, MD Consults/ Treatment Team:    [name] [name] is a 37 y.o. female patient admitted from ED awake, alert - oriented  X 3 - no acute distress noted.  VSS - Blood pressure 94/59, pulse 98, temperature 98.1 F (36.7 C), temperature source Oral, resp. rate 22, SpO2 97.00%.  no c/o shortness of breath, no c/o chest pain.  IV Fluids:  IV in place, occlusive dsg intact without redness, IV cath hand left, condition patent and no redness normal saline.  Allergies:   Allergies  Allergen Reactions  . Morphine Dermatitis    Skin turned red     Past Medical History  Diagnosis Date  . Cerebral palsy   . Pulmonary embolism     Lifetime Coumadin  . Cerebral palsy     Spastic Cerebral palsy, mentally intact  . Contracture, joint, multiple sites     Electric wheelchair, uses left hand to operate chair.   [name] Kitchen Hypertension   . OCD (obsessive compulsive disorder)   . Depression   . Migraine   . [name]   . Dysarthria   . Endometriosis   . History of recurrent UTIs   . GERD (gastroesophageal reflux disease)   . Pulmonary embolism 2011, 01/2011    Will be on lifetime coumadin  . Anemia    Medications Prior to Admission  Medication Sig Dispense Refill  . ARIPiprazole (ABILIFY) 10 MG tablet Take 10 mg by mouth at bedtime.        . baclofen (LIORESAL) 10 MG tablet Take 10 mg by mouth 4 (four) times daily. Take at 0600, 1200, 1800 & at bedtime       . BACLOFEN IT 2,000 mcg/mL by Intrathecal route continuous. basal rate of 13.5  mcg per hour. The patient receives bolus doses of 25 mcg delivered at midnight, 6 AM, 12 noon, and 6 PM, for total of 428.0 mcg per day      . buPROPion (WELLBUTRIN XL) 150 MG 24 hr tablet Take 1 tablet (150 mg total) by mouth daily.      . clonazePAM (KLONOPIN) 1 MG tablet Take 1 tablet (1 mg total) by mouth 4 (four) times  daily.  30 tablet  2  . lamoTRIgine (LAMICTAL) 200 MG tablet Take 200 mg by mouth daily.      [name] Kitchen loratadine (CLARITIN) 10 MG tablet Take 10 mg by mouth at bedtime.        . Multiple Vitamin (MULTIVITAMIN WITH MINERALS) TABS Take 1 tablet by mouth daily.      [name] Kitchen oxyCODONE-acetaminophen (ROXICET) 5-325 MG per tablet Take one tablet every 4 hours as needed for pain  120 tablet  0  . phenazopyridine (PYRIDIUM) 200 MG tablet Take 1 tablet (200 mg total) by mouth 3 (three) times daily as needed for pain.  10 tablet  0  . polyethylene glycol powder (GLYCOLAX/MIRALAX) powder Take 17 g by mouth 2 (two) times daily.  3350 g  1  . QUEtiapine (SEROQUEL) 100 MG tablet Take 100 mg by mouth at bedtime.        . Rivaroxaban (XARELTO) 20 MG TABS Take 20 mg by mouth daily with supper.      . sertraline (ZOLOFT) 100 MG tablet Take 200 mg by mouth at bedtime.        . SUMAtriptan (IMITREX) 100 MG tablet Take 100 mg by mouth every 2 (two) hours as  needed. Take every 2 hours x2 doses. For migraines.       . topiramate (TOPAMAX) 100 MG tablet Take 100 mg by mouth at bedtime.        [name] Kitchen acetic acid (VOSOL) 2 % otic solution Place 4 drops into both ears once a week.  15 mL  0  . acetic acid (VOSOL) 2 % otic solution Place in ear(s). Place 4 drops into both ears once a week.      [name] Kitchen ibuprofen (ADVIL,MOTRIN) 400 MG tablet Take 400 mg by mouth every 8 (eight) hours as needed. For pain       . metoprolol (TOPROL-XL) 100 MG 24 hr tablet Take 100 mg by mouth every evening.        . metoprolol succinate (TOPROL-XL) 25 MG 24 hr tablet Take 1 tablet (25 mg total) by mouth daily.  90 tablet  3  . pseudoephedrine (SUDAFED) 30 MG tablet Take 1 tablet (30 mg total) by mouth every 6 (six) hours as needed for congestion.  30 tablet  0  . tiZANidine (ZANAFLEX) 4 MG tablet Take 4 mg by mouth at bedtime.        . traMADol (ULTRAM) 50 MG tablet Take 50 mg by mouth every 6 (six) hours as needed. Maximum dose= 8 tablets per day. For pain/abdominal  cramping.        History:  obtained from the patient. Tobacco/alcohol: denied none  Orientation to room, and floor completed with information packet given to patient/family.  Patient declined safety video at this time.  Admission INP armband ID verified with patient/family, and in place.   SR up x 2, fall assessment complete, with patient and family able to verbalize understanding of risk associated with falls, and verbalized understanding to call nsg before up out of bed.  Call light within reach, patient able to voice, and demonstrate understanding.  Skin, clean-dry- intact without evidence of bruising, or skin tears.   No evidence of skin break down noted on exam.     Will cont to eval and treat per MD orders.  Orvan Seen, RN 03/10/2013 5:37 PM

## 2013-03-10 NOTE — Care Management Note (Signed)
    Page 1 of 1   03/12/2013     3:04:55 PM   CARE MANAGEMENT NOTE 03/12/2013  Patient:  DANIYA, ARAMBURO   Account Number:  [id number]  Date Initiated:  03/10/2013  Documentation initiated by:  Letha Cape  Subjective/Objective Assessment:   dx Hematuria  admit- from [facility]     Action/Plan:   Anticipated DC Date:  03/12/2013   Anticipated DC Plan:  SKILLED NURSING FACILITY  In-house referral  Clinical Social Worker      DC Planning Services  CM consult      Choice offered to / List presented to:             Status of service:  Completed, signed off Medicare Important Message given?   (If response is "NO", the following Medicare IM given date fields will be blank) Date Medicare IM given:   Date Additional Medicare IM given:    Discharge Disposition:  SKILLED NURSING FACILITY  Per UR Regulation:  Reviewed for med. necessity/level of care/duration of stay  If discussed at Long Length of Stay Meetings, dates discussed:    Comments:  03/12/13 14:52 Letha Cape RN, BSN [phone] patient is for dc back to SNF today per MD, informed CSW , she is aware.  03/10/13 16:37 Letha Cape RN, BSN [phone] patient is from [place], CSW referral.

## 2013-03-10 NOTE — Assessment & Plan Note (Addendum)
May be due to irritation from the catheter, rule out infection May need urology consultation here or in [facility] if bleeding persistent.  Addendum: due to low blood pressure, patient was transferred to hospital for observation.  Hemoglobin stable ~10.  Will start empiric antibiotics and IVF.  Will continue to follow hospital course and prepare for discharge back to [facility].

## 2013-03-10 NOTE — H&P (Signed)
Family Medicine Teaching [facility] Admission History and Physical  Patient name: [name] [name] Medical record number: [id number] Date of birth: 12-May-1976 Age: 37 y.o. Gender: female  Primary Care Provider: [name], MD  Chief Complaint: Heamturia, abdominal pain History of Present Illness: [name] [name] is a 37 y.o. 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 The patient says that the bleeding started last night. It is coming from her suprapubic catheter which she had replaced on Friday 03/05/13 at [facility]. She is also passing clots from her urine into her diaper. The patient is taking rivaroxaban for a history of two pulmonary emboli. This was started within the last month, because she as taking warfarin in March 2014. [name] [name] notes lower pelvic pain. She denies fever, chills, nausea, and vomiting. She is a resident a Information systems manager.     Patient Active Problem List  Diagnosis  . DEPRESSION, MAJOR, RECURRENT  . OBSESSIVE COMPUL. DISORDER  . CEREBRAL PALSY  . HYPERTENSION, BENIGN  . DYSFUNCTIONAL UTERINE BLEEDING  . Contracture of joint of multiple sites  . WRIST PAIN, LEFT  . Long term (current) use of anticoagulants  . Seborrheic keratosis  . Dyspnea  . Urinary retention  . Recurrent pulmonary embolism  . IUD (intrauterine device) in place  . Nausea  . Abdominal cramping  . Macroscopic hematuria   Past Medical History: Past Medical History  Diagnosis Date  . Cerebral palsy   . Pulmonary embolism     Lifetime Coumadin  . Cerebral palsy     Spastic Cerebral palsy, mentally intact  . Contracture, joint, multiple sites     Electric wheelchair, uses left hand to operate chair.   [name] Kitchen Hypertension   . OCD (obsessive compulsive disorder)   . Depression   . Migraine   . [name]   . Dysarthria   . Endometriosis   .  History of recurrent UTIs   . GERD (gastroesophageal reflux disease)   . Pulmonary embolism 2011, 01/2011    Will be on lifetime coumadin  . Anemia     Past Surgical History: Past Surgical History  Procedure Laterality Date  . Colposcopy  06/2000  . Cesarean section    . Tubal ligation  2003  . Carpal tunnel release  08/2008    Dr Teressa Senter  . Wrist surgery  06/2010    Dr Dierdre Searles, hand surgeon, [name]  . Baclofen pump refill      x 3 times  . Appendectomy    . Intrauterine device insertion  04/30/11    Inserted by [facility] GYN for endometriosis    Social History: History   Social History  . Marital Status: Divorced    Spouse Name: N/A    Number of Children: 2  . Years of Education: N/A   Occupational History  .     Social History Main Topics  . Smoking status: Never Smoker   . Smokeless tobacco: Never Used  . Alcohol Use: No  . Drug Use: No  . Sexually Active: No   Other Topics Concern  . Not on file   Social History Narrative   Ailene Ards, who has schizophrenia, MR, is father of daughter.  Homero Fellers and pt are no longer together.  Homero Fellers does not see [name]. Daughter is 90 yo, Stage manager, lives with pt.        [name] [phone]), lives with pt's parents.  Needs assistance with ADL's and IADL's--has personal care services 20 hrs/wk;       No tobacco, EtOH, drugs.      **Patient very concerned about her kids being taken from her.      **Case Manager: Jack Quarto [phone]    Family History: Family History  Problem Relation Age of Onset  . Asthma Father   . Cancer Maternal Grandmother   . Cancer Paternal Grandmother     Allergies: Allergies  Allergen Reactions  . Morphine Dermatitis    Skin turned red    No current facility-administered medications for this encounter.   Review Of Systems: Per HPI with the following additions: none Otherwise 12 point review of systems was performed and was unremarkable.  Physical Exam: Temp:  [98.1 F (36.7 C)]  98.1 F (36.7 C) (04/09 1547) Pulse Rate:  [98] 98 (04/09 1547) Resp:  [22] 22 (04/09 1547) BP: (94)/(59) 94/59 mmHg (04/09 1547) SpO2:  [97 %] 97 % (04/09 1547)   General: awake, alert, interactive, very small stature, contractures in arms HEENT: PERRLA, extra ocular movement intact and neck supple with midline trachea Heart: S1, S2 normal, no murmur, rub or gallop, regular rate and rhythm;  Lungs: clear to auscultation, no wheezes or rales and unlabored breathing Abdomen: pump under cutaneous tissue in RLQ, non distended, non tender, NABS Extremities: flexion contractures of elbows, wrists, and ankles; no swelling of extremities; 2+ peripheral pulses, < 2 sec cap refill  Skin: no rashes or ulcers Neurology: mental status, speech normal, alert and oriented x3, PERLA and quadriparesis noted  Labs and Imaging:  No results found for this or any previous visit (from the past 24 hour(s)).  No results found.    Assessment and Plan: Amye Grego is a 37 y.o. year old female with quadriparesis, neurogenic bladder with recent suprapubic catheter, and taking oral anticoagulants for prior pulmonary embolism who presents with gross hematuria and low-normal blood pressure.    # Hematuria - Likely bleeding from combination of trauma to bladder during recent catheter change and anticoagulation with rivaroxaban; other causes include infection vs vascular malformation vs malignancy, but less likely - Stop anticoagulation for now - Obtain CBC, urine gram stain and culture - Gentle hydration  # Quadriparesis - Cerebral Palsy - Continue previous regimen of baclofen pump, baclofen PO, and zanaflex PO  # Neuro/Psych - OCD, Depression - Cont abilify, bupropion, clonazapem, lamotrigine, quetiapine, sertraline, and topamax  # HTN - Hold metoprolol given low-normal BP  # Chronic Pelvic Pain - s/p partial hysterectomy - Cont Percocet  FENGI - NS @ 75 ml/hr, regular diet,  PPX - SCD's given  active bleeding DISPO - Admit to family medicine inpatient service   Si Raider. Clinton Sawyer, MD, MBA 03/10/2013, 4:09 PM Family Medicine Resident, PGY-2 [phone] pager

## 2013-03-11 ENCOUNTER — Encounter: Payer: Self-pay | Admitting: Family Medicine

## 2013-03-11 ENCOUNTER — Inpatient Hospital Stay (HOSPITAL_COMMUNITY): Payer: Medicaid Other

## 2013-03-11 DIAGNOSIS — N898 Other specified noninflammatory disorders of vagina: Secondary | ICD-10-CM

## 2013-03-11 LAB — WET PREP, GENITAL
Clue Cells Wet Prep HPF POC: NONE SEEN
Trich, Wet Prep: NONE SEEN

## 2013-03-11 LAB — URINE MICROSCOPIC-ADD ON

## 2013-03-11 LAB — GRAM STAIN

## 2013-03-11 LAB — URINALYSIS, ROUTINE W REFLEX MICROSCOPIC
Bilirubin Urine: NEGATIVE
Glucose, UA: NEGATIVE mg/dL
Protein, ur: 30 mg/dL — AB

## 2013-03-11 LAB — CBC
MCH: 29.3 pg (ref 26.0–34.0)
MCHC: 32.2 g/dL (ref 30.0–36.0)
Platelets: 224 10*3/uL (ref 150–400)

## 2013-03-11 MED ORDER — FLUCONAZOLE 150 MG PO TABS
150.0000 mg | ORAL_TABLET | Freq: Once | ORAL | Status: AC
  Administered 2013-03-11: 150 mg via ORAL
  Filled 2013-03-11: qty 1

## 2013-03-11 MED ORDER — NYSTATIN 100000 UNIT/GM EX CREA
TOPICAL_CREAM | Freq: Two times a day (BID) | CUTANEOUS | Status: DC
  Administered 2013-03-11 – 2013-03-12 (×2): via TOPICAL
  Filled 2013-03-11: qty 15

## 2013-03-11 NOTE — Assessment & Plan Note (Signed)
OT/PT for improvement of function.

## 2013-03-11 NOTE — Progress Notes (Signed)
Subjective: Patient is concerned this morning because she feels her bladder is constantly full.   Objective: Vital signs in last 24 hours: Temp:  [97.4 F (36.3 C)-98.5 F (36.9 C)] 98.5 F (36.9 C) (04/10 1031) Pulse Rate:  [78-98] 87 (04/10 1031) Resp:  [20-22] 20 (04/10 1031) BP: (79-123)/(54-72) 123/72 mmHg (04/10 1031) SpO2:  [92 %-98 %] 98 % (04/10 1031) Weight:  [121 lb (54.885 kg)] 121 lb (54.885 kg) (04/09 2231) Weight change:  Last BM Date: 03/10/13  Intake/Output from previous day: 04/09 0701 - 04/10 0700 In: -  Out: 700 [Urine:700] Intake/Output this shift: Total I/O In: 200 [P.O.:200] Out: -  PE: General: awake, alert, interactive, very small stature, contractures in arms and feet HEENT: PERRLA, extra ocular movement intact and neck supple with midline trachea  Heart: S1, S2 normal, no murmur, rub or gallop, regular rate and rhythm;  Lungs: clear to auscultation, no wheezes or rales and unlabored breathing  Abdomen: pump under cutaneous tissue in RLQ, non distended, suprapubic tenderness, NABS, suprapubic catheter with clean dry and intact dressing. Copious foul smelling  vaginal discharge noted when investigating abdomen and catheters.  Extremities: flexion contractures of elbows, wrists, feet and ankles; no swelling of extremities; 2+ peripheral pulses, < 2 sec cap refill  Skin: no rashes or ulcers  Neurology: normal mental status, speech normal, alert and oriented x3, PERLA and quadriparesis noted  Lab Results:  Recent Labs  03/10/13 1700 03/11/13 0320  WBC 7.0 8.4  HGB 9.5* 10.1*  HCT 28.5* 31.4*  PLT 224 224   BMET  Recent Labs  03/10/13 1700  NA 135  K 3.5  CL 105  CO2 22  GLUCOSE 211*  BUN 12  CREATININE 0.51  CALCIUM 8.3*    Studies/Results: No results found.  Medications: I have reviewed the patient's current medications.  Assessment/Plan: Danay Mckellar is a 37 y.o. year old female with quadriparesis, neurogenic bladder with  recent suprapubic catheter, and taking oral anticoagulants for prior pulmonary embolism who presents with gross hematuria and low-normal blood pressure.    Hematuria - Likely bleeding from combination of trauma to bladder during recent catheter change and anticoagulation with rivaroxaban; other causes include infection vs vascular malformation vs malignancy, but less likely. Will consult urology if bleeding continues.  - Stop anticoagulation for now  - Obtain CBC, urine gram stain and culture  - Gentle hydration  Quadriparesis - Cerebral Palsy  - Continue previous regimen of baclofen pump, baclofen PO, and zanaflex PO  Neuro/Psych - OCD, Depression  - Cont abilify, bupropion, clonazapem, lamotrigine, quetiapine, sertraline, and topamax  HTN - Hold metoprolol given low-normal BP  Chronic Pelvic Pain - s/p partial hysterectomy  - Cont Percocet  - Culture and wep prep vagina - Bladder scan FENGI - NS @ 75 ml/hr, regular diet,  PPX - SCD's given active bleeding  DISPO - Admit to family medicine inpatient service, will discharge back to [facility] once stable    LOS: 1 day   Kuneff, Renee 03/11/2013, 1:47 PM

## 2013-03-11 NOTE — Progress Notes (Signed)
interim note:  [name] [name] is 37 y.o. quadriplegic, on Xarelto for PE since December 2013,  with hematuria after suprapubic cath placement last Friday. Patient has complained of bladder pressure since her admission.  She also complains of vaginal irritation. Patient denies sexual activity or abuse.  I have provided the lab with two sterile vaginal swabs of copious vaginal fluid to run a wet prep and they stated they could not perform the test. After calling and discussing the matter, it was decided we could complete a wet prep off the sterile specimen provided. I thank them kindly for their help, as providing another specimen would have not been  ideal for this patient.   GYN:  External genitalia with erythema and thick, white, foul smelling vaginal discharge.  Medial thighs also with erythema.   Bladder scan: 20-25 cc urine residual. Foley with 75cc clear yellow urine.  Microscopic wet-mount exam shows TNTC white blood cells, and few yeast. No clue cells or trich noted.   A/P: - Will treat yeast infection with Diflucan x1 today, pericare daily and nystatin cream for areas of erythema on inner thighs.  - Constipation is a possible cause for bladder pressure, patient has been on bowel regimen since admission, but will obtain a KUB.

## 2013-03-11 NOTE — Progress Notes (Signed)
Seen and examined.  Hematuria has cleared.  She has chronic, but now worse pelvic pain and the feeling of a full bladder.  It is hard to determine whether she has a symptomatic UTI or is just colonized as expected with a chronic indwelling catheter.  Will discuss with team when to restart anticoagulant

## 2013-03-11 NOTE — Progress Notes (Signed)
Patient ID: [name] [name], female   DOB: 1975-12-13, 37 y.o.   MRN: [id number] I  discussed the treatment plan with Dr Tye Savoy interviewed and examined this patient on 03/08/13.[name] Kitchen

## 2013-03-12 ENCOUNTER — Encounter: Payer: Self-pay | Admitting: Pediatrics

## 2013-03-12 DIAGNOSIS — R310 Gross hematuria: Secondary | ICD-10-CM | POA: Insufficient documentation

## 2013-03-12 DIAGNOSIS — R109 Unspecified abdominal pain: Secondary | ICD-10-CM | POA: Diagnosis present

## 2013-03-12 LAB — CBC
Platelets: 241 10*3/uL (ref 150–400)
RBC: 3.22 MIL/uL — ABNORMAL LOW (ref 3.87–5.11)
WBC: 7.8 10*3/uL (ref 4.0–10.5)

## 2013-03-12 MED ORDER — DEXTROSE 5 % IV SOLN
1.0000 g | INTRAVENOUS | Status: DC
  Administered 2013-03-12: 1 g via INTRAVENOUS
  Filled 2013-03-12: qty 10

## 2013-03-12 MED ORDER — DEXTROSE 5 % IV SOLN: 1.0000 g | INTRAVENOUS | Status: AC

## 2013-03-12 MED ORDER — LIP MEDEX EX OINT: TOPICAL_OINTMENT | CUTANEOUS | Status: DC | PRN

## 2013-03-12 MED ORDER — RIVAROXABAN 20 MG PO TABS: 20.0000 mg | ORAL_TABLET | Freq: Every day | ORAL | Status: DC

## 2013-03-12 MED ORDER — FLUCONAZOLE 100 MG PO TABS: 100.0000 mg | ORAL_TABLET | ORAL | Status: DC

## 2013-03-12 MED ORDER — NYSTATIN 100000 UNIT/GM EX CREA: TOPICAL_CREAM | Freq: Two times a day (BID) | CUTANEOUS | Status: DC

## 2013-03-12 MED ORDER — MUPIROCIN 2 % EX OINT: 1.0000 "application " | TOPICAL_OINTMENT | Freq: Two times a day (BID) | CUTANEOUS | Status: DC

## 2013-03-12 MED ORDER — LIP MEDEX EX OINT
TOPICAL_OINTMENT | CUTANEOUS | Status: DC | PRN
  Filled 2013-03-12: qty 7

## 2013-03-12 MED ORDER — FLUCONAZOLE 150 MG PO TABS
150.0000 mg | ORAL_TABLET | Freq: Once | ORAL | Status: AC
  Administered 2013-03-12: 150 mg via ORAL
  Filled 2013-03-12: qty 1

## 2013-03-12 NOTE — Progress Notes (Signed)
Pt tx back to [facility] via [facility]tar.  Pt agreeable to transport.

## 2013-03-12 NOTE — Progress Notes (Signed)
Seen and examined.  I agree with Dr. Claiborne Billings.  DC to [facility] today if arrangements can be made.

## 2013-03-12 NOTE — Progress Notes (Signed)
Patient ID: [name] [name], female   DOB: Apr 10, 1976, 37 y.o.   MRN: [id number] Subjective: Patient is wanting to spend another night in hospital. Otherwise she is doing better today,still feeling bladder pressure. UTI present.  Objective: BP 108/53  Pulse 66  Temp(Src) 98.1 F (36.7 C) (Oral)  Resp 20  Ht 4\' 9"  (1.448 m)  Wt 121 lb (54.885 kg)  BMI 26.18 kg/m2  SpO2 96%  Intake/Output Summary (Last 24 hours) at 03/12/13 0943 Last data filed at 03/12/13 0500  Gross per 24 hour  Intake    320 ml  Output   1325 ml  Net  -1005 ml    General: awake, alert, interactive, very small stature, contractures in arms and feet HEENT: PERRLA, extra ocular movement intact and neck supple with midline trachea  Heart: S1, S2 normal, no murmur, rub or gallop, regular rate and rhythm;  Lungs: clear to auscultation, no wheezes or rales and unlabored breathing  Abdomen: pump under cutaneous tissue in RLQ, non distended, suprapubic tenderness, NABS, suprapubic catheter with clean dry and intact dressing.  Extremities: flexion contractures of elbows, wrists, feet and ankles; no swelling of extremities; 2+ peripheral pulses, < 2 sec cap refill  Skin: erythema bilateral medial thighs, near genitalia d/t yeast Neurology: normal mental status, speech understable, alert and oriented x3, PERLA and quadriparesis noted  Lab Results: CBC    Component Value Date/Time   WBC 7.8 03/12/2013 0708   RBC 3.22* 03/12/2013 0708   HGB 9.5* 03/12/2013 0708   HGB 12.6 05/03/2011 1615   HCT 29.4* 03/12/2013 0708   PLT 241 03/12/2013 0708   MCV 91.3 03/12/2013 0708   MCH 29.5 03/12/2013 0708   MCHC 32.3 03/12/2013 0708   RDW 13.8 03/12/2013 0708   LYMPHSABS 2.9 12/10/2011 1534   MONOABS 0.6 12/10/2011 1534   EOSABS 0.2 12/10/2011 1534   BASOSABS 0.0 12/10/2011 1534   Studies/Results: No results found.  Medications: I have reviewed the patient's current medications.  Assessment/Plan: [name] [name] is a 37 y.o. year old female  with quadriparesis, neurogenic bladder with recent suprapubic catheter, and taking oral anticoagulants for prior pulmonary embolism who presented with gross hematuria and low-normal blood pressure. Hemoglobin is stable and hematuria has resolved. Her urine specimen from [facility] is growing Proteus Mirabilis and patient has been started on ceftriaxone today. She has also receiving her 2nd dose of diflucan today for her yeast infection.    Hematuria other causes include infection vs vascular malformation vs malignancy, but less likely. --> resolved.  - Stopped anticoagulation --> Xarelto will be restarted in 5d - WBC improving and Hgb stable - KUB negative for stones. Quadriparesis - Cerebral Palsy  - Continue previous regimen of baclofen pump, baclofen PO, and zanaflex PO  Neuro/Psych - OCD, Depression  - Cont abilify, bupropion, clonazapem, lamotrigine, quetiapine, sertraline, and topamax  HTN - Hold metoprolol given low-normal BP  Chronic Pelvic Pain - s/p partial hysterectomy  - Cont Percocet  - Culture and wep prep vagina - Bladder scan FENGI - Regular diet PPX - SCD's given active bleeding  DISPO - Admit to family medicine inpatient service, will discharge back to [facility] hopefully today   LOS: 1 day   [name] [name] 03/11/2013, 1:47 PM

## 2013-03-12 NOTE — Progress Notes (Signed)
Pt asked for something for pain. RN went in to give pt something- pt was asleep. RN called pt's name twice just to verify state of sleep- pt did not wake up.

## 2013-03-12 NOTE — Progress Notes (Signed)
Pt d/c back to [facility].  Pt under no distress.  Skin intact.  Suprapubic Foley in place.  Report called to RN at [facility].

## 2013-03-12 NOTE — Discharge Summary (Signed)
Physician Discharge Summary  Patient ID: [name] [name] MRN: [id number] DOB/AGE: October 26, 1976 37 y.o.  Admit date: 03/10/2013 Discharge date: 03/12/2013  Admission Diagnoses: Abdominal Pain, hematuria  Discharge Diagnoses:  Principal Problem:   Hematuria, gross Active Problems:   DEPRESSION, MAJOR, RECURRENT   HYPERTENSION, BENIGN   Long term (current) use of anticoagulants   Chronic suprapubic catheter   Abdominal  pain, other specified site   Discharged Condition: good  Hospital Course: [name] [name] is a 37 y.o. year old female with quadriparesis, neurogenic bladder with recent suprapubic catheter, and taking oral anticoagulants for prior pulmonary embolism who presented with gross hematuria and low-normal blood pressure.   # Hematuria - Likely bleeding from combination of trauma to bladder during recent catheter change and anticoagulation with rivaroxaban. Stopped Xarelto on admission. Urine cultures resulted in Proteus infection of the urine. Will Continue Xarelto after ~4d after discharge, giving wound a chance to heal and antibiotics a chance to decrease her bacteria load in her bladder.   # Quadriparesis - Cerebral Palsy; Continued previous regimen of baclofen pump, baclofen PO, and zanaflex PO  # Neuro/Psych - OCD, Depression; Continued abilify, bupropion, clonazapem, lamotrigine, quetiapine, sertraline, and topamax  # HTN - Held metoprolol given low-normal BP; did not continue on discharge, BP has remained stable off of medication.  # Chronic Pelvic Pain - s/p partial hysterectomy; Percocet for pain   Consults: None  Significant Diagnostic Studies: microbiology: urine culture: positive for proteus mirabilis and KUB negative for renal stones or acute process.   Treatments: Diflucan for yeast, Nystatin cream for yeast, Ceftriaxone for Proteus Mirabilis UTI.   Discharge Exam: Blood pressure 97/69, pulse 104, temperature 98.7 F (37.1 C), temperature source Oral, resp. rate  18, height 4\' 9"  (1.448 m), weight 121 lb (54.885 kg), SpO2 96.00%. General: awake, alert, interactive, very small stature, contractures in arms and feet  HEENT: PERRLA, extra ocular movement intact and neck supple with midline trachea  Heart: S1, S2 normal, no murmur, rub or gallop, regular rate and rhythm;  Lungs: clear to auscultation, no wheezes or rales and unlabored breathing  Abdomen: pump under cutaneous tissue in RLQ, non distended, suprapubic tenderness, NABS, suprapubic catheter with clean dry and intact dressing.  Extremities: flexion contractures of elbows, wrists, feet and ankles; no swelling of extremities; 2+ peripheral pulses, < 2 sec cap refill  Skin: erythema bilateral medial thighs, near genitalia d/t yeast  Neurology: normal mental status, speech understable, alert and oriented x3, PERLA and quadriparesis noted   Disposition: 03-Skilled Nursing Facility       Future Appointments Provider Department Dept Phone   03/25/2013 1:00 PM Levert Feinstein, MD [facility] [phone]       Medication List    ASK your doctor about these medications       acetic acid 2 % otic solution  Commonly known as:  VOSOL  Place 4 drops into both ears once a week.     ARIPiprazole 10 MG tablet  Commonly known as:  ABILIFY  Take 10 mg by mouth at bedtime.     baclofen 10 MG tablet  Commonly known as:  LIORESAL  Take 10 mg by mouth 4 (four) times daily. Take at 0600, 1200, 1800 & at bedtime     BACLOFEN IT  2,000 mcg/mL by Intrathecal route continuous. basal rate of 13.5  mcg per hour. The patient receives bolus doses of 25 mcg delivered at midnight, 6 AM, 12 noon, and 6  PM, for total of 428.0  mcg per day     buPROPion 150 MG 24 hr tablet  Commonly known as:  WELLBUTRIN XL  Take 1 tablet (150 mg total) by mouth daily.     clonazePAM 1 MG tablet  Commonly known as:  KLONOPIN  Take 1 tablet (1 mg total) by mouth 4 (four) times daily.     ibuprofen 400 MG tablet   Commonly known as:  ADVIL,MOTRIN  Take 400 mg by mouth every 8 (eight) hours as needed. For pain     lamoTRIgine 200 MG tablet  Commonly known as:  LAMICTAL  Take 200 mg by mouth daily.     loratadine 10 MG tablet  Commonly known as:  CLARITIN  Take 10 mg by mouth at bedtime.     metoprolol succinate 100 MG 24 hr tablet  Commonly known as:  TOPROL-XL  Take 100 mg by mouth every evening.     metoprolol succinate 25 MG 24 hr tablet  Commonly known as:  TOPROL-XL  Take 1 tablet (25 mg total) by mouth daily.     multivitamin with minerals Tabs  Take 1 tablet by mouth daily.     oxyCODONE-acetaminophen 5-325 MG per tablet  Commonly known as:  ROXICET  Take one tablet every 4 hours as needed for pain     phenazopyridine 200 MG tablet  Commonly known as:  PYRIDIUM  Take 1 tablet (200 mg total) by mouth 3 (three) times daily as needed for pain.     polyethylene glycol powder powder  Commonly known as:  GLYCOLAX/MIRALAX  Take 17 g by mouth 2 (two) times daily.     pseudoephedrine 30 MG tablet  Commonly known as:  SUDAFED  Take 1 tablet (30 mg total) by mouth every 6 (six) hours as needed for congestion.     QUEtiapine 100 MG tablet  Commonly known as:  SEROQUEL  Take 100 mg by mouth at bedtime.     sertraline 100 MG tablet  Commonly known as:  ZOLOFT  Take 200 mg by mouth at bedtime.     SUMAtriptan 100 MG tablet  Commonly known as:  IMITREX  Take 100 mg by mouth every 2 (two) hours as needed. Take every 2 hours x2 doses. For migraines.     tiZANidine 4 MG tablet  Commonly known as:  ZANAFLEX  Take 4 mg by mouth at bedtime.     topiramate 100 MG tablet  Commonly known as:  TOPAMAX  Take 100 mg by mouth at bedtime.     traMADol 50 MG tablet  Commonly known as:  ULTRAM  Take 50 mg by mouth every 6 (six) hours as needed. Maximum dose= 8 tablets per day. For pain/abdominal cramping.     XARELTO 20 MG Tabs  Generic drug:  Rivaroxaban  Take 20 mg by mouth daily with  supper.       Outpatient Recommendations: - There seemed to be a question about the dose of  Lamictal, that pharmacy attempted to verify from [facility], and was unsuccessful during her hospital stay.  - Pericare for yeast infection, which has caused irritation to her inner thighs as well.  - Restart Xarelto April 15th. - Diflucan 100 mg every other day for 10 days.   - Discontinued Metoprolol; BP has been stable off medications.    SignedFelix Pacini 03/12/2013, 11:59 AM

## 2013-03-13 LAB — URINE CULTURE: Colony Count: >=100000

## 2013-03-13 NOTE — Discharge Summary (Signed)
Seen and examined on date of DC.  Agree with dc as outlined by Dr. Claiborne Billings.

## 2013-03-15 ENCOUNTER — Other Ambulatory Visit: Payer: Self-pay | Admitting: Family Medicine

## 2013-03-15 MED ORDER — OXYCODONE-ACETAMINOPHEN 5-325 MG PO TABS: ORAL_TABLET | ORAL | Status: DC

## 2013-03-17 MED ORDER — OXYCODONE HCL 5 MG PO CAPS: 5.0000 mg | ORAL_CAPSULE | ORAL | Status: DC | PRN

## 2013-03-17 NOTE — Addendum Note (Signed)
Addended by: Tye Savoy, IVY on: 03/17/2013 05:07 PM   Modules accepted: Orders

## 2013-03-18 ENCOUNTER — Encounter: Payer: Self-pay | Admitting: Pharmacist

## 2013-03-24 ENCOUNTER — Other Ambulatory Visit: Payer: Self-pay | Admitting: Family Medicine

## 2013-03-24 MED ORDER — CLONAZEPAM 1 MG PO TABS: 1.0000 mg | ORAL_TABLET | Freq: Four times a day (QID) | ORAL | Status: DC

## 2013-03-25 ENCOUNTER — Ambulatory Visit (INDEPENDENT_AMBULATORY_CARE_PROVIDER_SITE_OTHER): Payer: Medicaid Other | Admitting: Neurology

## 2013-03-25 ENCOUNTER — Encounter: Payer: Self-pay | Admitting: Neurology

## 2013-03-25 DIAGNOSIS — G8250 Quadriplegia, unspecified: Secondary | ICD-10-CM

## 2013-03-25 DIAGNOSIS — G808 Other cerebral palsy: Secondary | ICD-10-CM

## 2013-03-25 DIAGNOSIS — F32A Depression, unspecified: Secondary | ICD-10-CM

## 2013-03-25 DIAGNOSIS — I2699 Other pulmonary embolism without acute cor pulmonale: Secondary | ICD-10-CM

## 2013-03-25 DIAGNOSIS — K219 Gastro-esophageal reflux disease without esophagitis: Secondary | ICD-10-CM | POA: Insufficient documentation

## 2013-03-25 DIAGNOSIS — M1990 Unspecified osteoarthritis, unspecified site: Secondary | ICD-10-CM

## 2013-03-25 DIAGNOSIS — F329 Major depressive disorder, single episode, unspecified: Secondary | ICD-10-CM

## 2013-03-25 DIAGNOSIS — D649 Anemia, unspecified: Secondary | ICD-10-CM

## 2013-03-25 DIAGNOSIS — R471 Dysarthria and anarthria: Secondary | ICD-10-CM | POA: Insufficient documentation

## 2013-03-25 MED ORDER — ONABOTULINUMTOXINA 100 UNITS IJ SOLR
400.0000 [IU] | Freq: Once | INTRAMUSCULAR | Status: AC
  Administered 2013-03-25: 400 [IU] via INTRAMUSCULAR

## 2013-03-25 NOTE — Progress Notes (Signed)
HPI: [name] [name] is a 37 year old Female with spastic and dystonic quadriparesis, depression/ anxiety, episodes where she loses contact with her world and that have been studied thoroughly and appear nonepileptic in nature, migraine headaches.  She is currently taking coumadin for her previous DVT, recurrent pulmonary emboli.  She came in for EMG guided BOTOX A injection, previously was done by Dr. Kelli Hope since Feb, 2006, received injection at [facility] prior to that.   BOTOX has always been to her right arm, where she often complains of spastic pain, her left arm even though has significant spasticity, is her functional arm.  She used her left arm to manipulate wheelchair, operating cellphone and feed herself.  She also has intrathecal baclofen pump. She used to be able to transfer herself in/out of wheelchair, living in NH now she has gradually worsening functional capacity over the past few month, especially since her recent hospital admission in August 2012. She complained of syncopy, there was associated hypoxia to 83%, CT angiogram of bran and neck were normal. EEG ws normal. abilify and klonopin dosage were decreased  She was discharged to nursing home now,could no longer transfer herself in and out of wheelchair, less functional left arm.  UPDATE April 24th 2014: Last injection was in Jan 24th 2014, She received 200 untis to her right arm, She now complains of right wrist flexion spasm, right shoulder discomfort , could barely maintain her body and neck straight, she also complains of continued worsening of her left arm function, her left hand brought up, she wants some Botox injection to her left arm, and relieve her left fingers   Mental Status: seated in wheelchair, slumped slightly, with head band and chest vest to hold her body posture.  Cranial Nerves: round minimally reactive pupils,  conjugate eye movement, dystonic but symmetric facial strength, difficulty protruding her tongue,  severe dysarthria to the point of being in unintelligible at times Motor: right arm in shoulder anterior rotation, pronation, elbow flexion, wrist and finger flexion, when extend, she has uncontrollable right shoulder posterior extension.  Left arm able to have antigravity movement, stayed in left elbow pronation. The left hand was fisted but she was able to open and close her left hand extending and flexing her fingers slowly and with considerable effort.  She was able to move her legs against gravity.  Sensory: Withdrawal x4 Coordination:  poor coordination Gait and Station: spastic diplegia, in wheelchair    Assessment and Plan:  Spastic quadriplegia, under EMG guidance, 400 units of BOTOX A were injected,  (Lot No, [id number], exp Oct 2016), 100 units were dissolved into 2 cc of NS.  Right arm 250 units, left arm 150 unit  Right biceps 25 Right Brachialis 50 Right pronator teres 25 Right flex Carpi ulnaris 25 Right palmaris longus 25 Right flexor digitorum superficialis 25 Right flexor digitorum profoundi  25 Right rhomboid muscle 25 units  Left biceps 25x2=50 Left brachialis 25x2=50 Left pronator teres 25 Left flexor digitorum profundus 25  Left flexor carpi ulnaris 25  She tolerate the injection well, will return in 3 months for repeat injections.

## 2013-03-29 ENCOUNTER — Other Ambulatory Visit: Payer: Self-pay | Admitting: Family Medicine

## 2013-03-29 MED ORDER — CLONAZEPAM 1 MG PO TABS: 1.0000 mg | ORAL_TABLET | Freq: Four times a day (QID) | ORAL | Status: DC

## 2013-03-30 NOTE — Telephone Encounter (Signed)
Written and attached to fax to send to [facility]  

## 2013-04-03 ENCOUNTER — Telehealth: Payer: Self-pay | Admitting: Family Medicine

## 2013-04-03 NOTE — Telephone Encounter (Signed)
After hours call from [place].  Patient's suprapubic catheter is not draining urine well, there is some brown fluid and blood drainaing, the catheter will not flush. Patient has no thad a fever or abdominal pain.   I gave verbal orders to change suprapubic catheter, and asked them to be sure to notify me if patient has a fever.

## 2013-04-04 ENCOUNTER — Encounter (HOSPITAL_COMMUNITY): Payer: Self-pay | Admitting: Emergency Medicine

## 2013-04-04 ENCOUNTER — Emergency Department (HOSPITAL_COMMUNITY)
Admission: EM | Admit: 2013-04-04 | Discharge: 2013-04-04 | Disposition: A | Discharge status: Home / Self Care | Payer: Medicaid Other | Attending: Emergency Medicine | Admitting: Emergency Medicine

## 2013-04-04 DIAGNOSIS — T83091A Other mechanical complication of indwelling urethral catheter, initial encounter: Secondary | ICD-10-CM

## 2013-04-04 DIAGNOSIS — Z8742 Personal history of other diseases of the female genital tract: Secondary | ICD-10-CM | POA: Insufficient documentation

## 2013-04-04 DIAGNOSIS — Z86711 Personal history of pulmonary embolism: Secondary | ICD-10-CM | POA: Insufficient documentation

## 2013-04-04 DIAGNOSIS — Y838 Other surgical procedures as the cause of abnormal reaction of the patient, or of later complication, without mention of misadventure at the time of the procedure: Secondary | ICD-10-CM | POA: Insufficient documentation

## 2013-04-04 DIAGNOSIS — Z79899 Other long term (current) drug therapy: Secondary | ICD-10-CM | POA: Insufficient documentation

## 2013-04-04 DIAGNOSIS — Z862 Personal history of diseases of the blood and blood-forming organs and certain disorders involving the immune mechanism: Secondary | ICD-10-CM | POA: Insufficient documentation

## 2013-04-04 DIAGNOSIS — Z8669 Personal history of other diseases of the nervous system and sense organs: Secondary | ICD-10-CM | POA: Insufficient documentation

## 2013-04-04 DIAGNOSIS — G43909 Migraine, unspecified, not intractable, without status migrainosus: Secondary | ICD-10-CM | POA: Insufficient documentation

## 2013-04-04 DIAGNOSIS — F329 Major depressive disorder, single episode, unspecified: Secondary | ICD-10-CM | POA: Insufficient documentation

## 2013-04-04 DIAGNOSIS — K219 Gastro-esophageal reflux disease without esophagitis: Secondary | ICD-10-CM | POA: Insufficient documentation

## 2013-04-04 DIAGNOSIS — I10 Essential (primary) hypertension: Secondary | ICD-10-CM | POA: Insufficient documentation

## 2013-04-04 DIAGNOSIS — Z87442 Personal history of urinary calculi: Secondary | ICD-10-CM | POA: Insufficient documentation

## 2013-04-04 DIAGNOSIS — F429 Obsessive-compulsive disorder, unspecified: Secondary | ICD-10-CM | POA: Insufficient documentation

## 2013-04-04 DIAGNOSIS — F3289 Other specified depressive episodes: Secondary | ICD-10-CM | POA: Insufficient documentation

## 2013-04-04 DIAGNOSIS — N390 Urinary tract infection, site not specified: Secondary | ICD-10-CM | POA: Insufficient documentation

## 2013-04-04 DIAGNOSIS — R319 Hematuria, unspecified: Secondary | ICD-10-CM

## 2013-04-04 DIAGNOSIS — Z8739 Personal history of other diseases of the musculoskeletal system and connective tissue: Secondary | ICD-10-CM | POA: Insufficient documentation

## 2013-04-04 DIAGNOSIS — R079 Chest pain, unspecified: Secondary | ICD-10-CM | POA: Insufficient documentation

## 2013-04-04 DIAGNOSIS — R300 Dysuria: Secondary | ICD-10-CM | POA: Insufficient documentation

## 2013-04-04 LAB — BASIC METABOLIC PANEL
BUN: 11 mg/dL (ref 6–23)
GFR calc Af Amer: >90 mL/min (ref >90)
GFR calc non Af Amer: >90 mL/min (ref >90)
Potassium: 3.7 mEq/L (ref 3.5–5.1)
Sodium: 138 mEq/L (ref 135–145)

## 2013-04-04 LAB — URINALYSIS, ROUTINE W REFLEX MICROSCOPIC
Nitrite: NEGATIVE
Protein, ur: >300 mg/dL — AB
Specific Gravity, Urine: 1.031 — ABNORMAL HIGH (ref 1.005–1.030)
Urobilinogen, UA: 1 mg/dL (ref 0.0–1.0)

## 2013-04-04 LAB — CBC WITH DIFFERENTIAL/PLATELET
Basophils Absolute: 0 10*3/uL (ref 0.0–0.1)
Basophils Relative: 0 % (ref 0–1)
MCHC: 32.3 g/dL (ref 30.0–36.0)
Neutro Abs: 6.5 10*3/uL (ref 1.7–7.7)
Neutrophils Relative %: 59 % (ref 43–77)
RDW: 13 % (ref 11.5–15.5)

## 2013-04-04 LAB — URINE MICROSCOPIC-ADD ON

## 2013-04-04 MED ORDER — FENTANYL CITRATE 0.05 MG/ML IJ SOLN
50.0000 ug | Freq: Once | INTRAMUSCULAR | Status: AC
  Administered 2013-04-04: 50 ug via INTRAVENOUS
  Filled 2013-04-04: qty 2

## 2013-04-04 MED ORDER — SODIUM CHLORIDE 0.9 % IV BOLUS (SEPSIS)
500.0000 mL | Freq: Once | INTRAVENOUS | Status: AC
  Administered 2013-04-04: 04:00:00 via INTRAVENOUS

## 2013-04-04 MED ORDER — SODIUM CHLORIDE 0.9 % IV BOLUS (SEPSIS)
500.0000 mL | Freq: Once | INTRAVENOUS | Status: AC
  Administered 2013-04-04: 500 mL via INTRAVENOUS

## 2013-04-04 MED ORDER — FENTANYL CITRATE 0.05 MG/ML IJ SOLN
100.0000 ug | Freq: Once | INTRAMUSCULAR | Status: AC
  Administered 2013-04-04: 100 ug via INTRAVENOUS
  Filled 2013-04-04: qty 2

## 2013-04-04 NOTE — ED Notes (Addendum)
Per [facility] , [facility] who was reported to have blockage on her supra pubic catheter started yesterday. Also reported of hematuria and UTI . Pt. Has cerebral palsy , alert and oriented x 3.

## 2013-04-04 NOTE — ED Notes (Signed)
Per lab, Patient urine sample spilled into collection bag, must be recollected.

## 2013-04-04 NOTE — ED Notes (Signed)
Urine sent down for analysis.

## 2013-04-04 NOTE — ED Notes (Signed)
Bed:WA06<BR> Expected date:<BR> Expected time:<BR> Means of arrival:<BR> Comments:<BR> EMS

## 2013-04-04 NOTE — ED Notes (Addendum)
Suprapubic cath irrigated with 80ml of  NS, no resistance but no return noted. MD notified.

## 2013-04-04 NOTE — ED Provider Notes (Signed)
[name] [name] is a 37 y.o. female who is here for evaluation of dysuria and blocked suprapubic catheter. The records are somewhat unclear, but it appears that she has a chronic indwelling suprapubic catheter. It appears that the catheter was changed on 03/05/13. The patient states that she has been having dysuria, with spontaneous voiding, since the catheter has become less functional in the last several days. Her PCP, advised last night that the catheter be changed. Therefore, the patient was sent to the ED for that procedure. The patient states that since her catheter was changed, by Dr. Norlene Campbell, earlier today, she is not having dysuria, with voiding. She states that she has a followup appointment with her urologist, in 3 days. She complains of abdominal pain, on evaluation, at 09:45. She has chronic pelvic pain.  Assessment: Dysfunctional suprapubic catheter has been replaced and is now draining. The urine is bloody, post drainage, possibly indicating traumatic injury. I doubt that she has a perforated ladder, and/or Intraperitoneal catheterization (chronic suprapubic catheter). A urine culture has been sent. The urinalysis today is abnormal, but in the face of chronic catheterization, does not necessarily indicate UTI. She does not have a fever, tachycardia, or significant white cell count elevation. She is stable for discharge with outpatient followup and management.  Nursing Notes Reviewed/ Care Coordinated Applicable Imaging Reviewed Interpretation of Laboratory Data incorporated into ED treatment    Note is made of recent D/C Summary, copied Below    Physician Discharge Summary    Patient ID:  [name] [name]  MRN: [id number]  DOB/AGE: Apr 12, 1976 37 y.o.  Admit date: 03/10/2013  Discharge date: 03/12/2013  Admission Diagnoses: Abdominal Pain, hematuria  Discharge Diagnoses:  Principal Problem:  Hematuria, gross  Active Problems:  DEPRESSION, MAJOR, RECURRENT  HYPERTENSION, BENIGN  Long  term (current) use of anticoagulants  Chronic suprapubic catheter  Abdominal pain, other specified site  Discharged Condition: good  Hospital Course:  [name] [name] is a 37 y.o. year old female with quadriparesis, neurogenic bladder with recent suprapubic catheter, and taking oral anticoagulants for prior pulmonary embolism who presented with gross hematuria and low-normal blood pressure.  # Hematuria - Likely bleeding from combination of trauma to bladder during recent catheter change and anticoagulation with rivaroxaban. Stopped Xarelto on admission. Urine cultures resulted in Proteus infection of the urine. Will Continue Xarelto after ~4d after discharge, giving wound a chance to heal and antibiotics a chance to decrease her bacteria load in her bladder.  # Quadriparesis - Cerebral Palsy; Continued previous regimen of baclofen pump, baclofen PO, and zanaflex PO  # Neuro/Psych - OCD, Depression; Continued abilify, bupropion, clonazapem, lamotrigine, quetiapine, sertraline, and topamax  # HTN - Held metoprolol given low-normal BP; did not continue on discharge, BP has remained stable off of medication.  # Chronic Pelvic Pain - s/p partial hysterectomy; Percocet for pain  Consults: None  Significant Diagnostic Studies: microbiology: urine culture: positive for proteus mirabilis and KUB negative for renal stones or acute process.  Treatments: Diflucan for yeast, Nystatin cream for yeast, Ceftriaxone for Proteus Mirabilis UTI.  Discharge Exam:  Blood pressure 97/69, pulse 104, temperature 98.7 F (37.1 C), temperature source Oral, resp. rate 18, height 4\' 9"  (1.448 m), weight 121 lb (54.885 kg), SpO2 96.00%.  General: awake, alert, interactive, very small stature, contractures in arms and feet  HEENT: PERRLA, extra ocular movement intact and neck supple with midline trachea  Heart: S1, S2 normal, no murmur, rub or gallop, regular rate and rhythm;  Lungs: clear to auscultation,  no wheezes or rales  and unlabored breathing  Abdomen: pump under cutaneous tissue in RLQ, non distended, suprapubic tenderness, NABS, suprapubic catheter with clean dry and intact dressing.  Extremities: flexion contractures of elbows, wrists, feet and ankles; no swelling of extremities; 2+ peripheral pulses, < 2 sec cap refill  Skin: erythema bilateral medial thighs, near genitalia d/t yeast  Neurology: normal mental status, speech understable, alert and oriented x3, PERLA and quadriparesis noted  Disposition: 03-Skilled Nursing Facility      Flint Melter, MD 04/04/13 1011

## 2013-04-04 NOTE — ED Provider Notes (Signed)
History     CSN: [id number]  Arrival date & time 04/04/13  0103   First MD Initiated Contact with Patient 04/04/13 0129      Chief Complaint  Patient presents with  . blockage on suprapubic cath   . Urinary Tract Infection    (Consider location/radiation/quality/duration/timing/severity/associated sxs/prior treatment) HPI 37 yo female presents from nursing facility with complaint of pain with urination.  Pt has h/o CP, has suprapubic catheter.  Sxs reported started yesterday, concern for blockage of her catheter.  No reported fevers, no n/v.  Pt denies pain aside from when she feels she needs to urinate.    Past Medical History  Diagnosis Date  . Cerebral palsy   . Pulmonary embolism     Lifetime Coumadin  . Cerebral palsy     Spastic Cerebral palsy, mentally intact  . Contracture, joint, multiple sites     Electric wheelchair, uses left hand to operate chair.   [name] Kitchen Hypertension   . OCD (obsessive compulsive disorder)   . Depression   . Migraine   . [name]   . Dysarthria   . Endometriosis   . History of recurrent UTIs   . GERD (gastroesophageal reflux disease)   . Pulmonary embolism 2011, 01/2011    Will be on lifetime coumadin  . Anemia     Past Surgical History  Procedure Laterality Date  . Colposcopy  06/2000  . Cesarean section      x 2  . Tubal ligation  2003  . Carpal tunnel release  08/2008    Dr Teressa Senter  . Wrist surgery  06/2010    Dr Dierdre Searles, hand surgeon, [name]  . Baclofen pump refill      x 3 times  . Appendectomy    . Intrauterine device insertion  04/30/11    Inserted by [facility] for endometriosis  . Laparoscopically assisted vag hysterectomy  10/27/2012    Family History  Problem Relation Age of Onset  . Asthma Father   . Cancer Maternal Grandmother   . Cancer Paternal Grandmother     History  Substance Use Topics  . Smoking status: Never Smoker   . Smokeless tobacco: Never Used  . Alcohol Use: No    OB  History   Grav Para Term Preterm Abortions TAB SAB Ect Mult Living                  Review of Systems  All other systems reviewed and are negative.    Allergies  Morphine  Home Medications   Current Outpatient Rx  Name  Route  Sig  Dispense  Refill  . acetic acid (VOSOL) 2 % otic solution   Both Ears   Place 4 drops into both ears once a week. On Wednesdays         . ARIPiprazole (ABILIFY) 10 MG tablet   Oral   Take 10 mg by mouth at bedtime.           . baclofen (LIORESAL) 10 MG tablet   Oral   Take 10 mg by mouth 4 (four) times daily. Take at 0600, 1200, 1800 & at bedtime          . buPROPion (WELLBUTRIN XL) 150 MG 24 hr tablet   Oral   Take 150 mg by mouth every evening.         . clonazePAM (KLONOPIN) 1 MG tablet   Oral   Take 1 tablet (1 mg total) by  mouth 4 (four) times daily.   120 tablet   2   . lamoTRIgine (LAMICTAL) 200 MG tablet   Oral   Take 200 mg by mouth every evening.          . loratadine (CLARITIN) 10 MG tablet   Oral   Take 10 mg by mouth every evening.          . Multiple Vitamin (MULTIVITAMIN WITH MINERALS) TABS   Oral   Take 1 tablet by mouth every evening.          [name] Kitchen oxyCODONE-acetaminophen (PERCOCET/ROXICET) 5-325 MG per tablet   Oral   Take 1 tablet by mouth every 4 (four) hours as needed for pain. Take one tablet every 4 hours as needed for pain         . polyethylene glycol powder (GLYCOLAX/MIRALAX) powder   Oral   Take 17 g by mouth 2 (two) times daily.   3350 g   1   . QUEtiapine (SEROQUEL) 100 MG tablet   Oral   Take 100 mg by mouth at bedtime.          . Rivaroxaban (XARELTO) 20 MG TABS   Oral   Take 1 tablet (20 mg total) by mouth daily with supper.   30 tablet      . sertraline (ZOLOFT) 100 MG tablet   Oral   Take 200 mg by mouth at bedtime.           [name] Kitchen tiZANidine (ZANAFLEX) 4 MG tablet   Oral   Take 4 mg by mouth at bedtime.           . topiramate (TOPAMAX) 100 MG tablet   Oral    Take 100 mg by mouth at bedtime.           . traMADol (ULTRAM) 50 MG tablet   Oral   Take 50 mg by mouth every 6 (six) hours as needed for pain. Maximum dose= 8 tablets per day. For pain/abdominal cramping.           BP 92/60  Pulse 91  Temp(Src) 98.3 F (36.8 C) (Oral)  Resp 16  SpO2 98%  Physical Exam  Nursing note and vitals reviewed. Constitutional: She is oriented to person, place, and time. She appears well-developed and well-nourished. She appears distressed.  Pt with contractures of extremities.  She is awake and alert.  She appears uncomfortable  HENT:  Head: Normocephalic and atraumatic.  Nose: Nose normal.  Dry mucous membranes  Eyes: Conjunctivae and EOM are normal. Pupils are equal, round, and reactive to light.  Neck: Normal range of motion. Neck supple. No JVD present. No tracheal deviation present. No thyromegaly present.  Cardiovascular: Normal rate, regular rhythm, normal heart sounds and intact distal pulses.  Exam reveals no gallop and no friction rub.   No murmur heard. Pulmonary/Chest: Effort normal and breath sounds normal. No stridor. No respiratory distress. She has no wheezes. She has no rales. She exhibits no tenderness.  Abdominal: Soft. Bowel sounds are normal. She exhibits no distension and no mass. There is no tenderness. There is no rebound and no guarding.  Musculoskeletal: She exhibits no edema and no tenderness.  Lymphadenopathy:    She has no cervical adenopathy.  Neurological: She is alert and oriented to person, place, and time. She exhibits normal muscle tone. Coordination normal.  Skin: Skin is warm and dry. No rash noted. No erythema. No pallor.  Psychiatric: She has a normal mood and affect. Her  behavior is normal. Judgment and thought content normal.    ED Course  Procedures (including critical care time)  Labs Reviewed  CBC WITH DIFFERENTIAL - Abnormal; Notable for the following:    WBC 10.9 (*)    Hemoglobin 11.4 (*)    HCT  35.3 (*)    All other components within normal limits  URINE CULTURE  LACTIC ACID, PLASMA  BASIC METABOLIC PANEL  URINALYSIS, ROUTINE W REFLEX MICROSCOPIC   No results found.   1. Obstructed Foley catheter, initial encounter   2. Hematuria       MDM  37 yo female with CP presents with dysuria, suprapubic cath.  No signs of serious illness, no fever or tachycardia.  Will do bladder scan, irrigate catheter, send down ua.        Olivia Mackie, MD 04/04/13 [phone]

## 2013-04-04 NOTE — ED Notes (Signed)
Received pt. With 100cc of bloody urine in drainage bag.

## 2013-04-05 ENCOUNTER — Non-Acute Institutional Stay (INDEPENDENT_AMBULATORY_CARE_PROVIDER_SITE_OTHER): Payer: Medicaid Other | Admitting: Family Medicine

## 2013-04-05 ENCOUNTER — Non-Acute Institutional Stay: Payer: Medicaid Other | Admitting: Family Medicine

## 2013-04-05 DIAGNOSIS — Z9359 Other cystostomy status: Secondary | ICD-10-CM

## 2013-04-05 DIAGNOSIS — R310 Gross hematuria: Secondary | ICD-10-CM

## 2013-04-05 DIAGNOSIS — I10 Essential (primary) hypertension: Secondary | ICD-10-CM

## 2013-04-05 DIAGNOSIS — Z9350 Unspecified cystostomy status: Secondary | ICD-10-CM

## 2013-04-05 DIAGNOSIS — G808 Other cerebral palsy: Secondary | ICD-10-CM

## 2013-04-05 DIAGNOSIS — M2450 Contracture, unspecified joint: Secondary | ICD-10-CM

## 2013-04-05 DIAGNOSIS — F329 Major depressive disorder, single episode, unspecified: Secondary | ICD-10-CM

## 2013-04-05 LAB — URINE CULTURE

## 2013-04-05 NOTE — Assessment & Plan Note (Addendum)
Recent urine culture collected in ED on 5/4 did not result in significant growth. Moreover, patient has been asymptomatic with normal WBC, no fever and no true symptoms making symptomatic UTI less likely.  Will continue to monitor for any sign of infection.  Patient's xarelto had been held during her recent hospitalization due to hematuria. This has since been re-started. Monitor bleeding.

## 2013-04-05 NOTE — Assessment & Plan Note (Signed)
Patient on ultram and percocet 5/325. She was also recently seen for botox injections which seem to have helped with her pain, as patient denies any arm or joint pain.  PAtient to return for injections in 3 months.

## 2013-04-05 NOTE — Progress Notes (Signed)
Patient ID: [name] [name]    DOB: Nov 22, 1976, 37 y.o.   MRN: [id number] --- Subjective:  [name] [name] is a 37 y.o.female with spastic quadriparesis from cerebral palsy, h/o recurrent PE, h/o of neurogenic bladder and suprapubic catheter who is a patient at [facility] and was visited at the nursing home on 04/01/13.   When visiting patient, there was a concern for intermittent hematuria that had started 1 day prior to the visit. She denied any hematuria, any worsening abdominal pain (patient has a history of chronic abdominal pain). Denies any fevers, chills or malaise. She denies any light headedness or dizziness.  The nurse at the nursing home states that Urine culture was collected on 4/30 and has grown >100,000 gram negative rods.  She was in the hospital on 03/10/13 for hematuria and low BP. Her suprapubic catheter had been replaced on 03/05/13 and patient had gross hematuria, thought to be from combination of trauma to the area during replacement as well as xarelto use. At the time she was also found to have a UTI with proteus and was treated with a combination of ceftriaxone and keflex which she finished on 03/21/13.   ROS: see HPI Past Medical History: reviewed and updated medications and allergies. Social History: Tobacco: none  Objective: Weight: 104 lbs (on 4/28), BP: 112/0 (4/27), HR: 74, T: 98.5 (4/30) Physical Examination:   General appearance - alert, in no distress, arms with contractures Mouth - mucous membranes moist Chest - clear to auscultation, no wheezes, rales or rhonchi, symmetric air entry Heart - normal rate, regular rhythm, normal S1, S2, no murmurs Abdomen - soft, non tender, suprapubic catheter in place, no erythema surrounding it   Labs: URINE, CATHETERIZED    Special Requests none    Culture Setup Time 03/11/2013 13:01    Colony Count >=100,000 COLONIES/ML    Culture PROTEUS MIRABILIS    Report Status 03/13/2013 FINAL    Organism ID, Bacteria PROTEUS MIRABILIS     Resulting Agency [facility]   Culture & Susceptibility    Antibiotic  Organism Organism Organism     PROTEUS MIRABILIS     AMPICILLIN  <=2 SENSITIVE S Final       CEFAZOLIN  <=4 SENSITIVE S Final       CEFTRIAXONE  <=1 SENSITIVE S Final       CIPROFLOXACIN  <=0.25 SENSITIVE S Final       GENTAMICIN  <=1 SENSITIVE S Final       LEVOFLOXACIN  <=0.12 SENSITIVE S Final       NITROFURANTOIN  256 RESISTANT R Final       PIP/TAZO  <=4 SENSITIVE S Final       TOBRAMYCIN  <=1 SENSITIVE S Final       TRIMETH/SULFA  <=20 SENSITIVE S         Value Specimen Description  URINE, SUPRAPUBIC   Special Requests  NONE   Culture Setup Time  04/04/2013 15:51   Colony Count  50,000 COLONIES/ML   Culture  Multiple bacterial morphotypes present, none predominant. Suggest appropriate recollection if clinically indicated.   Report Status  04/05/2013 FINAL

## 2013-04-05 NOTE — Assessment & Plan Note (Signed)
Reviewed ED note from 5/4 where patient was seen for blocked suprapubic catheter. It was changed in the ED. Patient to be seen by urology this week.

## 2013-04-05 NOTE — Assessment & Plan Note (Signed)
Since back in nursing home, BP in the low 100's systolic and HR in 70's. Toprol XL 25mg  daily had been held in the hospital. Will continue to hold.

## 2013-04-07 DIAGNOSIS — D649 Anemia, unspecified: Secondary | ICD-10-CM | POA: Insufficient documentation

## 2013-04-07 DIAGNOSIS — K219 Gastro-esophageal reflux disease without esophagitis: Secondary | ICD-10-CM | POA: Insufficient documentation

## 2013-04-07 DIAGNOSIS — M1990 Unspecified osteoarthritis, unspecified site: Secondary | ICD-10-CM | POA: Insufficient documentation

## 2013-04-27 NOTE — Assessment & Plan Note (Signed)
She is currently wheelchair bound

## 2013-04-27 NOTE — Assessment & Plan Note (Signed)
Urine dark brown in bag but clear in tube, but cloudy.  Colonization with proteus causing sediment and tube obstruction, so will treat with Cipro in hopes of changing colonization to another organism. I explained this to her and her father and the staff, making it clear that she would always grow bacteria in her urine as long as she has the suprapubic tube.

## 2013-04-27 NOTE — Progress Notes (Signed)
  Subjective:    Patient ID: [name] [name], female    DOB: 09-02-1976, 37 y.o.   MRN: [id number]  HPI Urinary colonization - she had to be sent to the ER for replacement of her suprapubic catheter. The nurses noted that she has had much sediment in the bag. She grew proteus on culture one month ago and again on 03/31/13. No fever or back pain. She questions why she hasn't been treated for UTI. The ER physician noted that she didn't have elevated WBC or specific UTI symptoms and she doesn't have symptoms now.    Review of Systems     Objective:   Physical Exam  Constitutional:  Sitting in the wheelchair not wearing the head support designed by OT.   Pulmonary/Chest: Effort normal.  No CVA tenderness  Abdominal: Soft. There is no tenderness.  Neurological: She is alert.  Severe spastic quadriplegia          Assessment & Plan:

## 2013-04-27 NOTE — Assessment & Plan Note (Signed)
well controlled  

## 2013-05-17 ENCOUNTER — Telehealth: Payer: Self-pay | Admitting: Sports Medicine

## 2013-05-17 NOTE — Telephone Encounter (Signed)
[facility] 24 Hour After-hours Emergency Line  Patient name: [name] [name] Medical record number: [id number] Date of birth: 09-07-1976 Age: 37 y.o. Gender: female  Primary Care Provider: [name], MD     RN Olu called from [facility] to report there had been a medication error and that the patient has been receiving 15mg  of Abilify for the past 6 days since her dose was changed form 10 mg to 5mg .  She has been getting both the 10mg  and the 5mg  daily since that time.  They have corrected the error.  No side effects noted.  I informed I will forward to GERI team to address further.    Disposition  Pt to only receive 5mg  dosing.   Andrena Mews, DO [facility] Resident - PGY-2  05/17/2013 6:03 PM

## 2013-05-18 ENCOUNTER — Emergency Department (HOSPITAL_COMMUNITY)
Admission: EM | Admit: 2013-05-18 | Discharge: 2013-05-18 | Disposition: A | Discharge status: Home / Self Care | Payer: No Typology Code available for payment source | Attending: Emergency Medicine | Admitting: Emergency Medicine

## 2013-05-18 ENCOUNTER — Emergency Department (HOSPITAL_COMMUNITY): Payer: No Typology Code available for payment source

## 2013-05-18 ENCOUNTER — Encounter (HOSPITAL_COMMUNITY): Payer: Self-pay | Admitting: Emergency Medicine

## 2013-05-18 DIAGNOSIS — G43909 Migraine, unspecified, not intractable, without status migrainosus: Secondary | ICD-10-CM | POA: Insufficient documentation

## 2013-05-18 DIAGNOSIS — Y9389 Activity, other specified: Secondary | ICD-10-CM | POA: Insufficient documentation

## 2013-05-18 DIAGNOSIS — IMO0002 Reserved for concepts with insufficient information to code with codable children: Secondary | ICD-10-CM | POA: Insufficient documentation

## 2013-05-18 DIAGNOSIS — I10 Essential (primary) hypertension: Secondary | ICD-10-CM | POA: Insufficient documentation

## 2013-05-18 DIAGNOSIS — Z87440 Personal history of urinary (tract) infections: Secondary | ICD-10-CM | POA: Insufficient documentation

## 2013-05-18 DIAGNOSIS — Z8742 Personal history of other diseases of the female genital tract: Secondary | ICD-10-CM | POA: Insufficient documentation

## 2013-05-18 DIAGNOSIS — Z79899 Other long term (current) drug therapy: Secondary | ICD-10-CM | POA: Insufficient documentation

## 2013-05-18 DIAGNOSIS — F3289 Other specified depressive episodes: Secondary | ICD-10-CM | POA: Insufficient documentation

## 2013-05-18 DIAGNOSIS — Z8739 Personal history of other diseases of the musculoskeletal system and connective tissue: Secondary | ICD-10-CM | POA: Insufficient documentation

## 2013-05-18 DIAGNOSIS — Z8669 Personal history of other diseases of the nervous system and sense organs: Secondary | ICD-10-CM | POA: Insufficient documentation

## 2013-05-18 DIAGNOSIS — Z8679 Personal history of other diseases of the circulatory system: Secondary | ICD-10-CM | POA: Insufficient documentation

## 2013-05-18 DIAGNOSIS — F329 Major depressive disorder, single episode, unspecified: Secondary | ICD-10-CM | POA: Insufficient documentation

## 2013-05-18 DIAGNOSIS — E041 Nontoxic single thyroid nodule: Secondary | ICD-10-CM

## 2013-05-18 DIAGNOSIS — F429 Obsessive-compulsive disorder, unspecified: Secondary | ICD-10-CM | POA: Insufficient documentation

## 2013-05-18 DIAGNOSIS — Y92410 Unspecified street and highway as the place of occurrence of the external cause: Secondary | ICD-10-CM | POA: Insufficient documentation

## 2013-05-18 DIAGNOSIS — Z86711 Personal history of pulmonary embolism: Secondary | ICD-10-CM | POA: Insufficient documentation

## 2013-05-18 MED ORDER — HYDROCODONE-ACETAMINOPHEN 5-325 MG PO TABS: 1.0000 | ORAL_TABLET | Freq: Four times a day (QID) | ORAL | Status: DC | PRN

## 2013-05-18 MED ORDER — HYDROMORPHONE HCL PF 1 MG/ML IJ SOLN
1.0000 mg | Freq: Once | INTRAMUSCULAR | Status: AC
  Administered 2013-05-18: 1 mg via INTRAMUSCULAR
  Filled 2013-05-18: qty 1
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
## 2013-05-18 NOTE — ED Provider Notes (Signed)
  Medical screening examination/treatment/procedure(s) were performed by non-physician practitioner and as supervising physician I was immediately available for consultation/collaboration.    Gerhard Munch, MD 05/18/13 [phone]
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
## 2013-05-19 ENCOUNTER — Other Ambulatory Visit: Payer: Self-pay | Admitting: Family Medicine

## 2013-05-19 MED ORDER — HYDROCODONE-ACETAMINOPHEN 5-325 MG PO TABS: 1.0000 | ORAL_TABLET | Freq: Four times a day (QID) | ORAL | Status: DC | PRN
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
## 2013-05-20 ENCOUNTER — Non-Acute Institutional Stay (INDEPENDENT_AMBULATORY_CARE_PROVIDER_SITE_OTHER): Payer: Medicaid Other | Admitting: Family Medicine

## 2013-05-20 DIAGNOSIS — E042 Nontoxic multinodular goiter: Secondary | ICD-10-CM
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
## 2013-05-20 NOTE — Assessment & Plan Note (Signed)
Thyroid nodules:  Largest nodule 2mm.  Does not met threshold for bx.  Check TFT's

## 2013-05-26 ENCOUNTER — Other Ambulatory Visit: Payer: Self-pay | Admitting: *Deleted

## 2013-06-01 ENCOUNTER — Non-Acute Institutional Stay (INDEPENDENT_AMBULATORY_CARE_PROVIDER_SITE_OTHER): Payer: Medicaid Other | Admitting: Family Medicine

## 2013-06-01 DIAGNOSIS — E042 Nontoxic multinodular goiter: Secondary | ICD-10-CM

## 2013-06-01 DIAGNOSIS — F339 Major depressive disorder, recurrent, unspecified: Secondary | ICD-10-CM

## 2013-06-01 DIAGNOSIS — M545 Low back pain: Secondary | ICD-10-CM
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
## 2013-06-02 DIAGNOSIS — M545 Low back pain: Secondary | ICD-10-CM | POA: Insufficient documentation
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
## 2013-06-02 NOTE — Assessment & Plan Note (Addendum)
Cervical CT showing heterogeneous left thyroid tissue and focal thyroid nodules on the left side as well as 2mm nodule on right apex. Normal TSH.  Will obtain periodic TSH to monitor thyroid function in setting of multinodular goiter

## 2013-06-02 NOTE — Assessment & Plan Note (Addendum)
Patient seen by psychiatry York Spaniel with [facility]) who recommended very gradual taper off of abilify. Will treat acute issue of low back pain first and then taper abilify dose.

## 2013-06-05 ENCOUNTER — Telehealth: Payer: Self-pay | Admitting: Family Medicine
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
## 2013-06-11 ENCOUNTER — Other Ambulatory Visit: Payer: Self-pay | Admitting: Family Medicine

## 2013-06-15 ENCOUNTER — Other Ambulatory Visit: Payer: Self-pay | Admitting: Family Medicine

## 2013-06-16 ENCOUNTER — Non-Acute Institutional Stay: Payer: Medicaid Other | Admitting: Family Medicine

## 2013-06-16 ENCOUNTER — Non-Acute Institutional Stay (INDEPENDENT_AMBULATORY_CARE_PROVIDER_SITE_OTHER): Payer: Medicaid Other | Admitting: Family Medicine

## 2013-06-16 ENCOUNTER — Other Ambulatory Visit: Payer: Self-pay | Admitting: Family Medicine

## 2013-06-16 DIAGNOSIS — I10 Essential (primary) hypertension: Secondary | ICD-10-CM

## 2013-06-16 DIAGNOSIS — Z9350 Unspecified cystostomy status: Secondary | ICD-10-CM

## 2013-06-16 DIAGNOSIS — F339 Major depressive disorder, recurrent, unspecified: Secondary | ICD-10-CM

## 2013-06-16 DIAGNOSIS — G808 Other cerebral palsy: Secondary | ICD-10-CM

## 2013-06-16 DIAGNOSIS — M545 Low back pain: Secondary | ICD-10-CM

## 2013-06-16 DIAGNOSIS — Z9359 Other cystostomy status: Secondary | ICD-10-CM

## 2013-06-16 MED ORDER — HYDROCODONE-ACETAMINOPHEN 5-325 MG PO TABS: 1.0000 | ORAL_TABLET | Freq: Four times a day (QID) | ORAL | Status: DC | PRN

## 2013-06-16 MED ORDER — BUPROPION HCL ER (XL) 150 MG PO TB24: 150.0000 mg | ORAL_TABLET | Freq: Every evening | ORAL | Status: DC

## 2013-06-16 NOTE — Telephone Encounter (Signed)
Written and attached to fax to send to [facility]  
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
## 2013-06-24 NOTE — Assessment & Plan Note (Signed)
Patient's blood pressure is well-controlled her current medication regimen. Will continue current regimen.

## 2013-06-24 NOTE — Progress Notes (Signed)
Patient ID: [name] [name], female   DOB: [birth year], 37 y.o.   MRN: [id number] I interviewed and examined this patient and discussed the treatment plan with Dr Armen Pickup.

## 2013-06-24 NOTE — Assessment & Plan Note (Signed)
A: Increase specificity. P: Patient will have a refill of her baclofen pump and repeat Botox injections next month.

## 2013-06-24 NOTE — Assessment & Plan Note (Signed)
Patient closely followed by psychiatry. Psychiatry recommended to no medication changes. Will continue current management.
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
## 2013-06-24 NOTE — Assessment & Plan Note (Signed)
A: Minimal mucoid drainage and absence of urine leakage. P: Recommend cotton gauze twice daily throughout Insertion site to protect skin. Flush catheter twice daily.

## 2013-06-25 ENCOUNTER — Encounter: Payer: Self-pay | Admitting: Neurology

## 2013-06-25 ENCOUNTER — Ambulatory Visit (INDEPENDENT_AMBULATORY_CARE_PROVIDER_SITE_OTHER): Payer: Medicaid Other | Admitting: Neurology

## 2013-06-25 VITALS — BP 91/59 | HR 91 | Ht 59.0 in | Wt 106.0 lb

## 2013-06-25 DIAGNOSIS — M2450 Contracture, unspecified joint: Secondary | ICD-10-CM

## 2013-06-25 DIAGNOSIS — F339 Major depressive disorder, recurrent, unspecified: Secondary | ICD-10-CM

## 2013-06-25 DIAGNOSIS — F429 Obsessive-compulsive disorder, unspecified: Secondary | ICD-10-CM

## 2013-06-25 DIAGNOSIS — I10 Essential (primary) hypertension: Secondary | ICD-10-CM

## 2013-06-25 DIAGNOSIS — G808 Other cerebral palsy: Secondary | ICD-10-CM

## 2013-06-25 MED ORDER — ONABOTULINUMTOXINA 100 UNITS IJ SOLR
200.0000 [IU] | Freq: Once | INTRAMUSCULAR | Status: AC
  Administered 2013-06-25: 200 [IU] via INTRAMUSCULAR

## 2013-06-25 NOTE — Progress Notes (Signed)
HPI:   [name] [name] is a 37 year old Female with spastic and dystonic quadriparesis, depression/ anxiety, episodes where she loses contact with her world and that have been studied thoroughly and appear nonepileptic in nature, migraine headaches.  She is currently taking coumadin for her previous DVT, recurrent pulmonary emboli.  She came in for EMG guided BOTOX A injection, previously was done by Dr. Kelli Hope since Feb, 2006, received injection at [facility] prior to that.   BOTOX has always been to her right arm, where she often complains of spastic pain, her left arm even though has significant spasticity, is her functional arm.  She used her left arm to manipulate wheelchair, operating cellphone and feed herself.  She also has intrathecal baclofen pump. She used to be able to transfer herself in/out of wheelchair, living in NH now she has gradually worsening functional capacity over the past few month, especially since her recent hospital admission in August 2012. She complained of syncopy, there was associated hypoxia to 83%, CT angiogram of bran and neck were normal. EEG ws normal. abilify and klonopin dosage were decreased  She was discharged to nursing home now,could no longer transfer herself in and out of wheelchair, less functional left arm.  UPDATE July 25th 2014: Last injection was in April 2014, she received injection to her right arm, and also her left arm, which has been very helpful.  She now complains of right wrist flexion spasm, right shoulder discomfort , could barely maintain her body and neck straight, she also complains of continued worsening of her left arm function, her left hand balled up, she wants some Botox injection to her left arm, and relieve her left fingers   Physical Examinations:  Mental Status: seated in wheelchair, slumped slightly, with head band and chest vest to hold her body posture.  Cranial Nerves: round minimally reactive pupils,  conjugate eye  movement, dystonic but symmetric facial strength, difficulty protruding her tongue, severe dysarthria to the point of being in unintelligible at times Motor: right arm in shoulder anterior rotation, pronation, elbow flexion, wrist and finger flexion, when extend, she has uncontrollable right shoulder posterior extension.  Left arm able to have antigravity movement, stayed in left elbow pronation. The left hand was fisted but she was able to open and close her left hand extending and flexing her fingers slowly and with considerable effort.  She was able to move her legs against gravity.  Sensory: Withdrawal x4 Coordination:  poor coordination Gait and Station: spastic diplegia, in wheelchair   Assessment and Plan:  37 yo Caucasian female with spastic quadriplegia, under EMG guidance, 200 units of BOTOX A were injected,  (Lot No, [id number], exp Feb 2017), 100 units were dissolved into 2 cc of NS.  Right arm 175 units, left arm  25 unit  Right biceps 25 Right brachialis 25 Right pronator teres 25 Right flexor carpi ulnaris 25 Right flexor carpi radialis 25   Right teres major  25 Right latissmus dorsi 25 units    Left flexor digitorum profundus 25     She tolerate the injection well, will return in 3 months for repeat injections.

## 2013-06-26 ENCOUNTER — Telehealth: Payer: Self-pay | Admitting: Family Medicine
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
## 2013-07-02 ENCOUNTER — Encounter (HOSPITAL_COMMUNITY): Payer: Self-pay | Admitting: Pharmacy Technician

## 2013-07-05 ENCOUNTER — Other Ambulatory Visit: Payer: Self-pay | Admitting: Family Medicine

## 2013-07-05 MED ORDER — CLONAZEPAM 1 MG PO TABS: 1.0000 mg | ORAL_TABLET | Freq: Four times a day (QID) | ORAL | Status: DC

## 2013-07-05 NOTE — Telephone Encounter (Signed)
Faxed in [facility]ep[facility]ill to [facility].

## 2013-07-16 NOTE — Progress Notes (Signed)
Give arrival time of 09:30

## 2013-07-17 NOTE — Progress Notes (Signed)
Spoke with Sam, RN at [facility] and given instructions as noted. RN at facility did not have time to review history by phone. Requested that up to date [facility] with last dose off all medications given be sent along with H&P, lab work and any recent EKG, CXR. Patient on Xeralto and facility stated that they did not have instructions to stop same. Spoke to Dr. Ivin Booty on call Anesthesiologist who advised to contact Dr. Jeanice Lim regarding same. Contact Beth with Dr. Deirdre Peer office they advised that patient could stay on same and that they would contact facility and verify that with them.

## 2013-07-18 ENCOUNTER — Encounter (HOSPITAL_COMMUNITY): Payer: Self-pay | Admitting: Oral and Maxillofacial Surgery

## 2013-07-18 ENCOUNTER — Non-Acute Institutional Stay (INDEPENDENT_AMBULATORY_CARE_PROVIDER_SITE_OTHER): Payer: Medicaid Other | Admitting: Family Medicine

## 2013-07-18 DIAGNOSIS — M545 Low back pain: Secondary | ICD-10-CM

## 2013-07-18 DIAGNOSIS — M2450 Contracture, unspecified joint: Secondary | ICD-10-CM

## 2013-07-18 DIAGNOSIS — R635 Abnormal weight gain: Secondary | ICD-10-CM | POA: Insufficient documentation

## 2013-07-18 DIAGNOSIS — D649 Anemia, unspecified: Secondary | ICD-10-CM

## 2013-07-18 DIAGNOSIS — K029 Dental caries, unspecified: Secondary | ICD-10-CM | POA: Diagnosis present

## 2013-07-18 NOTE — Assessment & Plan Note (Signed)
Plan for exctraction under general anesthesia. Patient to be started on amoxicillin 500mg  tid on the day of surgery. Order written at nursing home.

## 2013-07-18 NOTE — Assessment & Plan Note (Addendum)
No signs or symptoms of fluid overload explaining this. No new medications added, although meds like abilify or seroquel could contribute to it.  If continues to increase in weight, consider TSH.

## 2013-07-18 NOTE — Progress Notes (Signed)
[name] [name] ID: [name] [name]    DOB: 07-15-1976, 37 y.o.   MRN: [id number] --- Subjective:  [name] [name] is a 37 y.o.female with h/o spastic cerebral palsy, depression, PE on xarelto, chronic suprapubic catheter, who was seen at [facility] for nursing home visit on 07/16/13. Concerns include: - upcoming surgery for tooth extraction: [name] [name] is apprehensive about it and needing to eat pureed diet. She will be requiring general anesthesia for extractions of multiple teeth with carries. This is planned for 07/19/13.   - spasticity from CP: a month ago, had more trouble using right hand to text and to drive her chair. Since then she has had PT/OT and botox injections by her neurologist. This seems to have helped in that it has allowed to regain some function of the right hand. She is able to manouver her chair and can text a little.  - recent weight gain: has gained 9lbs in the last month. [name] [name] denies any increased oral intake.  No recent medication change. She denies any lower extremity edema. No shortness of breath, orthopnea or PND. - rash on back: resolved with triamcinolone cream and removal of icy hot patches.   ROS: see HPI Past Medical History: reviewed and updated medications and allergies. Social History: Tobacco: none  Objective: BP: 119/72, HR:60, T: 97.6, RR: 18, weight: 115 lbs from 106 on 06/07/13  Physical Examination:   General appearance - alert, well appearing, and in no distress, sitting in motorized chair.  MSK - contracture of right hand with some mobility of shoulder but inability to control movement, left hand able to squeeze and press buttons, able to lift left arm slightly higher than shoulder.  Chest - clear to auscultation, no wheezes, or crackles Heart - normal rate, regular rhythm, normal S1, S2, no murmurs Abdomen - soft, nontender, nondistended, no masses or organomegaly, suprapubic catheter in place with clear mucous discharge surrounding opening. No erythema or warmth.    Extremities - no pitting edema.  Skin - back: no rash seen
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
## 2013-07-18 NOTE — Assessment & Plan Note (Signed)
Patient with cerebral palsy symptoms more prominent on right side than left.  Function in left hand and arm is maintained due to botox injections. Was seen by Dr. Terrace Arabia at [facility] in July and receives them every 3 months.

## 2013-07-18 NOTE — Assessment & Plan Note (Signed)
Consider repeat CBC.

## 2013-07-18 NOTE — H&P (Signed)
[name] [name] is an 37 y.o. female.   Chief Complaint: "One or two of these teeth have been hurting" HPI: [name] [name] is a 37 year old female with Cerebral Palsy that lives at [facility].  She was referred to our office by her general dentist for the extraction of carious teeth.  My exam was limited on the day of the consultation; however, it appeared that teeth #'s 5, 11, 16, and 19 are carious and non-restorable. [name] [name] will be taken to the [facility] OR for an exam under anesthesia and extraction of #5, 11, 16, and 19 and any necessary teeth.     PMHx:  Past Medical History  Diagnosis Date  . Cerebral palsy   . Pulmonary embolism     Lifetime Coumadin  . Cerebral palsy     Spastic Cerebral palsy, mentally intact  . Contracture, joint, multiple sites     Electric wheelchair, uses left hand to operate chair.   [name] Kitchen Hypertension   . OCD (obsessive compulsive disorder)   . Depression   . Migraine   . [name]   . Dysarthria   . Endometriosis   . History of recurrent UTIs   . GERD (gastroesophageal reflux disease)   . Pulmonary embolism 2011, 01/2011    Will be on lifetime coumadin  . Anemia     PSx:  Past Surgical History  Procedure Laterality Date  . Colposcopy  06/2000  . Cesarean section      x 2  . Tubal ligation  2003  . Carpal tunnel release  08/2008    Dr Teressa Senter  . Wrist surgery  06/2010    Dr Dierdre Searles, hand surgeon, [name]  . Baclofen pump refill      x 3 times  . Appendectomy    . Intrauterine device insertion  04/30/11    Inserted by [facility] GYN for endometriosis  . Laparoscopically assisted vag hysterectomy  10/27/2012    Family Hx:  Family History  Problem Relation Age of Onset  . Asthma Father   . Cancer Maternal Grandmother   . Cancer Paternal Grandmother     Social History:  reports that she has never smoked. She has never used smokeless tobacco. She reports that she drinks about 0.6 ounces of alcohol per  week. She reports that she does not use illicit drugs.  Allergies:  Allergies  Allergen Reactions  . Morphine Dermatitis    Skin turned red    Meds:   No current facility-administered medications for this encounter. Current outpatient prescriptions:acetic acid (VOSOL) 2 % otic solution, Place 4 drops into both ears once a week. On Wednesdays, Disp: , Rfl: ;  acetic acid-hydrocortisone (VOSOL-HC) otic solution, Place 4 drops into both ears every 7 (seven) days. Wednesdays, Disp: , Rfl: ;  ARIPiprazole (ABILIFY) 5 MG tablet, Take 5 mg by mouth every morning., Disp: , Rfl:  baclofen (LIORESAL) 10 MG tablet, Take 20 mg by mouth 3 (three) times daily. , Disp: , Rfl: ;  buPROPion (WELLBUTRIN SR) 150 MG 12 hr tablet, Take 150 mg by mouth at bedtime., Disp: , Rfl: ;  Hydrocodone-Acetaminophen 5-300 MG TABS, Take 1 tablet by mouth 4 (four) times daily as needed (pain). Not to exceed 3 gm apap/24hrs, Disp: , Rfl:  ibuprofen (ADVIL,MOTRIN) 200 MG tablet, Take 400 mg by mouth every 8 (eight) hours as needed for pain., Disp: , Rfl: ;  lamoTRIgine (LAMICTAL) 200 MG tablet, Take 200 mg by mouth at bedtime. ,  Disp: , Rfl: ;  loratadine (CLARITIN) 10 MG tablet, Take 10 mg by mouth every evening. , Disp: , Rfl: ;  Multiple Vitamin (MULTIVITAMIN WITH MINERALS) TABS, Take 1 tablet by mouth every evening. , Disp: , Rfl:  polyethylene glycol powder (GLYCOLAX/MIRALAX) powder, Take 17 g by mouth 2 (two) times daily., Disp: 3350 g, Rfl: 1;  pseudoephedrine (SUDAFED) 30 MG tablet, Take 30 mg by mouth every 6 (six) hours as needed for congestion., Disp: , Rfl: ;  QUEtiapine (SEROQUEL) 50 MG tablet, Take 50 mg by mouth at bedtime., Disp: , Rfl: ;  Rivaroxaban (XARELTO) 20 MG TABS, Take 1 tablet (20 mg total) by mouth daily with supper., Disp: 30 tablet, Rfl:  sertraline (ZOLOFT) 100 MG tablet, Take 200 mg by mouth at bedtime.  , Disp: , Rfl: ;  SUMAtriptan (IMITREX) 100 MG tablet, Take 100 mg by mouth every 2 (two) hours as  needed for migraine. Up to two doses, Disp: , Rfl: ;  tiZANidine (ZANAFLEX) 4 MG tablet, Take 4 mg by mouth at bedtime. , Disp: , Rfl: ;  topiramate (TOPAMAX) 100 MG tablet, Take 100 mg by mouth at bedtime. , Disp: , Rfl:  traMADol (ULTRAM) 50 MG tablet, Take 50 mg by mouth every 6 (six) hours as needed for pain. , Disp: , Rfl: ;  clonazePAM (KLONOPIN) 1 MG tablet, Take 1 tablet (1 mg total) by mouth 4 (four) times daily., Disp: 120 tablet, Rfl: 2  Labs: No results found for this or any previous visit (from the past 48 hour(s)).   Radiology: No results found.  ROS: Pertinent items are noted in HPI.  Vitals: LMP 04/05/2011 BP-11/76 Pulse - 86 SpO2% - 92 on room air RR: 16 Temp: 97.1 taken oral (not core temp).  Physical Exam: General appearance: alert, cooperative and mild distress from contracture due to Cerebral Palsy Head: Normocephalic, without obvious abnormality, atraumatic Eyes: conjunctivae/corneas clear. PERRL, EOM's intact. Fundi benign. Ears: normal TM's and external ear canals both ears Nose: Nares normal. Septum midline. Mucosa normal. No drainage or sinus tenderness. Throat: lips, mucosa, and tongue normal; teeth and gums normal Resp: clear to auscultation bilaterally Cardio: regular rate and rhythm, S1, S2 normal, no murmur, click, rub or gallop Extremities: Contracture of all for extremitys (arms worse than legs). Skin: Skin color, texture, turgor normal. No rashes or lesions Lymph nodes: Cervical, supraclavicular, and axillary nodes normal. Intraoral exam limited due to difficulty in opening the patient's mouth. Teeth #'s 5, 11, 16, and 19 appear to be carious.  There is a possibility that other teeth are affected with carious.     Assessment/Plan [name] [name] is a delightful 37 year old female with Cerebral Palsy with severe contracture.  She takes Xarelto for a previous Pulmonary Embolism.  She has carious and non-restorable teeth #'s 5, 11, 16, and 19.   1. The patient  will go to the OR for Exam under anesthesia,  Extraction of #5, 11, 16, 19, and Extraction of any necessary teeth.  2. She does not have to stop her Xarelto prior to surgery; I will used surgicel to pack the extraction sites. 3. The patient explains that she takes a long time to recover from anesthesia.  I will inquire with the Anesthesiologist if TIVA is possible as this will be a short procedure.     Campobello,Chinelo Benn L  07/18/2013, 9:35 PM

## 2013-07-19 ENCOUNTER — Encounter (HOSPITAL_COMMUNITY)
Admission: RE | Disposition: A | Discharge status: Home / Self Care | Payer: Self-pay | Source: Ambulatory Visit | Attending: Oral and Maxillofacial Surgery

## 2013-07-19 ENCOUNTER — Ambulatory Visit (HOSPITAL_COMMUNITY): Payer: Medicaid Other

## 2013-07-19 ENCOUNTER — Ambulatory Visit (HOSPITAL_COMMUNITY)
Admission: RE | Admit: 2013-07-19 | Discharge: 2013-07-19 | Disposition: A | Discharge status: Home / Self Care | Payer: Medicaid Other | Source: Ambulatory Visit | Attending: Oral and Maxillofacial Surgery | Admitting: Oral and Maxillofacial Surgery

## 2013-07-19 ENCOUNTER — Encounter (HOSPITAL_COMMUNITY): Payer: Self-pay | Admitting: Anesthesiology

## 2013-07-19 ENCOUNTER — Ambulatory Visit (HOSPITAL_COMMUNITY): Payer: Medicaid Other | Admitting: Anesthesiology

## 2013-07-19 ENCOUNTER — Encounter (HOSPITAL_COMMUNITY): Payer: Self-pay | Admitting: Certified Registered Nurse Anesthetist

## 2013-07-19 DIAGNOSIS — Z79899 Other long term (current) drug therapy: Secondary | ICD-10-CM | POA: Insufficient documentation

## 2013-07-19 DIAGNOSIS — F3289 Other specified depressive episodes: Secondary | ICD-10-CM | POA: Insufficient documentation

## 2013-07-19 DIAGNOSIS — Z7901 Long term (current) use of anticoagulants: Secondary | ICD-10-CM | POA: Insufficient documentation

## 2013-07-19 DIAGNOSIS — I10 Essential (primary) hypertension: Secondary | ICD-10-CM | POA: Insufficient documentation

## 2013-07-19 DIAGNOSIS — D649 Anemia, unspecified: Secondary | ICD-10-CM | POA: Insufficient documentation

## 2013-07-19 DIAGNOSIS — Z885 Allergy status to narcotic agent status: Secondary | ICD-10-CM | POA: Insufficient documentation

## 2013-07-19 DIAGNOSIS — M1990 Unspecified osteoarthritis, unspecified site: Secondary | ICD-10-CM | POA: Insufficient documentation

## 2013-07-19 DIAGNOSIS — G809 Cerebral palsy, unspecified: Secondary | ICD-10-CM | POA: Insufficient documentation

## 2013-07-19 DIAGNOSIS — F329 Major depressive disorder, single episode, unspecified: Secondary | ICD-10-CM | POA: Insufficient documentation

## 2013-07-19 DIAGNOSIS — K219 Gastro-esophageal reflux disease without esophagitis: Secondary | ICD-10-CM | POA: Insufficient documentation

## 2013-07-19 DIAGNOSIS — Z86711 Personal history of pulmonary embolism: Secondary | ICD-10-CM | POA: Insufficient documentation

## 2013-07-19 DIAGNOSIS — F429 Obsessive-compulsive disorder, unspecified: Secondary | ICD-10-CM | POA: Insufficient documentation

## 2013-07-19 DIAGNOSIS — G43909 Migraine, unspecified, not intractable, without status migrainosus: Secondary | ICD-10-CM | POA: Insufficient documentation

## 2013-07-19 DIAGNOSIS — K029 Dental caries, unspecified: Secondary | ICD-10-CM | POA: Insufficient documentation

## 2013-07-19 HISTORY — PX: MULTIPLE EXTRACTIONS WITH ALVEOLOPLASTY: SHX5342

## 2013-07-19 LAB — BASIC METABOLIC PANEL
CO2: 21 mEq/L (ref 19–32)
Calcium: 9.1 mg/dL (ref 8.4–10.5)
Chloride: 106 mEq/L (ref 96–112)
GFR calc Af Amer: >90 mL/min (ref >90)
Sodium: 139 mEq/L (ref 135–145)

## 2013-07-19 LAB — CBC
Platelets: 307 10*3/uL (ref 150–400)
RBC: 4.57 MIL/uL (ref 3.87–5.11)
RDW: 16 % — ABNORMAL HIGH (ref 11.5–15.5)
WBC: 10.9 10*3/uL — ABNORMAL HIGH (ref 4.0–10.5)

## 2013-07-19 LAB — SURGICAL PCR SCREEN
MRSA, PCR: NEGATIVE
Staphylococcus aureus: NEGATIVE

## 2013-07-19 SURGERY — MULTIPLE EXTRACTION WITH ALVEOLOPLASTY
Anesthesia: General | Site: Mouth | Laterality: Bilateral | Wound class: Clean Contaminated

## 2013-07-19 MED ORDER — PROMETHAZINE HCL 25 MG/ML IJ SOLN: 6.2500 mg | INTRAMUSCULAR | Status: DC | PRN

## 2013-07-19 MED ORDER — ROCURONIUM BROMIDE 100 MG/10ML IV SOLN
INTRAVENOUS | Status: DC | PRN
  Administered 2013-07-19: 40 mg via INTRAVENOUS

## 2013-07-19 MED ORDER — FENTANYL CITRATE 0.05 MG/ML IJ SOLN
25.0000 ug | INTRAMUSCULAR | Status: DC | PRN
  Administered 2013-07-19: 25 ug via INTRAVENOUS

## 2013-07-19 MED ORDER — MIDAZOLAM HCL 5 MG/5ML IJ SOLN
INTRAMUSCULAR | Status: DC | PRN
  Administered 2013-07-19: 1 mg via INTRAVENOUS

## 2013-07-19 MED ORDER — FENTANYL CITRATE 0.05 MG/ML IJ SOLN
INTRAMUSCULAR | Status: AC
  Administered 2013-07-19: 25 ug via INTRAVENOUS
  Filled 2013-07-19: qty 2

## 2013-07-19 MED ORDER — MIDAZOLAM HCL 2 MG/2ML IJ SOLN: 1.0000 mg | INTRAMUSCULAR | Status: DC | PRN

## 2013-07-19 MED ORDER — OXYMETAZOLINE HCL 0.05 % NA SOLN
NASAL | Status: AC
  Filled 2013-07-19: qty 15

## 2013-07-19 MED ORDER — BUPIVACAINE-EPINEPHRINE 0.5% -1:200000 IJ SOLN
INTRAMUSCULAR | Status: DC | PRN
  Administered 2013-07-19 (×4): 1.8 mL

## 2013-07-19 MED ORDER — LIDOCAINE-EPINEPHRINE 2 %-1:100000 IJ SOLN
INTRAMUSCULAR | Status: AC
  Filled 2013-07-19: qty 3.4

## 2013-07-19 MED ORDER — FENTANYL CITRATE 0.05 MG/ML IJ SOLN: 50.0000 ug | Freq: Once | INTRAMUSCULAR | Status: DC

## 2013-07-19 MED ORDER — LIDOCAINE-EPINEPHRINE 2 %-1:100000 IJ SOLN
INTRAMUSCULAR | Status: DC | PRN
  Administered 2013-07-19 (×9): 1.7 mL

## 2013-07-19 MED ORDER — ONDANSETRON HCL 4 MG/2ML IJ SOLN
INTRAMUSCULAR | Status: DC | PRN
  Administered 2013-07-19: 4 mg via INTRAVENOUS

## 2013-07-19 MED ORDER — CEFAZOLIN SODIUM-DEXTROSE 2-3 GM-% IV SOLR
2.0000 g | INTRAVENOUS | Status: AC
  Administered 2013-07-19: 2 g via INTRAVENOUS
  Filled 2013-07-19: qty 50

## 2013-07-19 MED ORDER — LACTATED RINGERS IV SOLN
INTRAVENOUS | Status: DC
  Administered 2013-07-19: 11:00:00 via INTRAVENOUS

## 2013-07-19 MED ORDER — PROPOFOL 10 MG/ML IV BOLUS
INTRAVENOUS | Status: DC | PRN
  Administered 2013-07-19: 140 mg via INTRAVENOUS

## 2013-07-19 MED ORDER — LIDOCAINE HCL (CARDIAC) 20 MG/ML IV SOLN
INTRAVENOUS | Status: DC | PRN
  Administered 2013-07-19: 80 mg via INTRAVENOUS

## 2013-07-19 MED ORDER — 0.9 % SODIUM CHLORIDE (POUR BTL) OPTIME
TOPICAL | Status: DC | PRN
  Administered 2013-07-19: 1000 mL

## 2013-07-19 MED ORDER — BUPIVACAINE-EPINEPHRINE (PF) 0.5% -1:200000 IJ SOLN
INTRAMUSCULAR | Status: AC
  Filled 2013-07-19: qty 7.2

## 2013-07-19 MED ORDER — MUPIROCIN 2 % EX OINT
TOPICAL_OINTMENT | CUTANEOUS | Status: AC
  Administered 2013-07-19: 11:00:00
  Filled 2013-07-19: qty 22

## 2013-07-19 MED ORDER — FENTANYL CITRATE 0.05 MG/ML IJ SOLN
INTRAMUSCULAR | Status: DC | PRN
  Administered 2013-07-19: 25 ug via INTRAVENOUS
  Administered 2013-07-19: 50 ug via INTRAVENOUS
  Administered 2013-07-19: 25 ug via INTRAVENOUS

## 2013-07-19 MED ORDER — GLYCOPYRROLATE 0.2 MG/ML IJ SOLN
INTRAMUSCULAR | Status: DC | PRN
  Administered 2013-07-19: 0.4 mg via INTRAVENOUS

## 2013-07-19 MED ORDER — ARTIFICIAL TEARS OP OINT
TOPICAL_OINTMENT | OPHTHALMIC | Status: DC | PRN
  Administered 2013-07-19: 1 via OPHTHALMIC

## 2013-07-19 MED ORDER — LACTATED RINGERS IV SOLN
INTRAVENOUS | Status: DC | PRN
  Administered 2013-07-19: 12:00:00 via INTRAVENOUS

## 2013-07-19 MED ORDER — NEOSTIGMINE METHYLSULFATE 1 MG/ML IJ SOLN
INTRAMUSCULAR | Status: DC | PRN
  Administered 2013-07-19: 3 mg via INTRAVENOUS

## 2013-07-19 SURGICAL SUPPLY — 39 items
ATTRACTOMAT 16X20 MAGNETIC DRP (DRAPES) ×2 IMPLANT
BLADE SURG 15 STRL LF DISP TIS (BLADE) ×2 IMPLANT
BLADE SURG 15 STRL SS (BLADE) ×2
BUR CROSS CUT FISSURE 1.6 (BURR) ×1 IMPLANT
BUR RND FLUTED 2.5 (BURR) ×1 IMPLANT
CANISTER SUCTION 2500CC (MISCELLANEOUS) ×2 IMPLANT
CLEANER TIP ELECTROSURG 2X2 (MISCELLANEOUS) ×1 IMPLANT
CLOTH BEACON ORANGE TIMEOUT ST (SAFETY) ×2 IMPLANT
COVER SURGICAL LIGHT HANDLE (MISCELLANEOUS) ×2 IMPLANT
ELECT COATED BLADE 2.86 ST (ELECTRODE) ×1 IMPLANT
ELECT REM PT RETURN 9FT ADLT (ELECTROSURGICAL) ×2
ELECTRODE REM PT RTRN 9FT ADLT (ELECTROSURGICAL) IMPLANT
GAUZE PACKING FOLDED 2  STR (GAUZE/BANDAGES/DRESSINGS) ×1
GAUZE PACKING FOLDED 2 STR (GAUZE/BANDAGES/DRESSINGS) ×1 IMPLANT
GAUZE SPONGE 4X4 16PLY XRAY LF (GAUZE/BANDAGES/DRESSINGS) ×2 IMPLANT
GLOVE BIO SURGEON STRL SZ 6.5 (GLOVE) ×1 IMPLANT
GLOVE BIO SURGEON STRL SZ7.5 (GLOVE) ×1 IMPLANT
GLOVE BIOGEL PI IND STRL 7.5 (GLOVE) ×1 IMPLANT
GLOVE BIOGEL PI INDICATOR 7.5 (GLOVE) ×3
GLOVE ORTHO TXT STRL SZ7.5 (GLOVE) ×2 IMPLANT
KIT BASIN OR (CUSTOM PROCEDURE TRAY) ×2 IMPLANT
KIT ROOM TURNOVER OR (KITS) ×2 IMPLANT
NDL BLUNT 16X1.5 OR ONLY (NEEDLE) ×2 IMPLANT
NDL DENTAL 27 LONG (NEEDLE) ×2 IMPLANT
NEEDLE BLUNT 16X1.5 OR ONLY (NEEDLE) ×4 IMPLANT
NEEDLE DENTAL 27 LONG (NEEDLE) ×4 IMPLANT
NS IRRIG 1000ML POUR BTL (IV SOLUTION) ×2 IMPLANT
PACK EENT II TURBAN DRAPE (CUSTOM PROCEDURE TRAY) ×2 IMPLANT
PAD ARMBOARD 7.5X6 YLW CONV (MISCELLANEOUS) ×4 IMPLANT
PENCIL BUTTON HOLSTER BLD 10FT (ELECTRODE) ×1 IMPLANT
SOLUTION BETADINE 4OZ (MISCELLANEOUS) ×1 IMPLANT
SPONGE SURGIFOAM ABS GEL SZ50 (HEMOSTASIS) ×1 IMPLANT
SUT CHROMIC 3 0 PS 2 (SUTURE) ×4 IMPLANT
SYR 50ML SLIP (SYRINGE) ×4 IMPLANT
SYR BULB IRRIGATION 50ML (SYRINGE) ×2 IMPLANT
TOOTHBRUSH ADULT (PERSONAL CARE ITEMS) ×1 IMPLANT
TOWEL OR 17X24 6PK STRL BLUE (TOWEL DISPOSABLE) ×2 IMPLANT
TOWEL OR 17X26 10 PK STRL BLUE (TOWEL DISPOSABLE) ×2 IMPLANT
TUBE CONNECTING 12X1/4 (SUCTIONS) ×2 IMPLANT

## 2013-07-19 NOTE — Anesthesia Postprocedure Evaluation (Signed)
  Anesthesia Post-op Note  Patient: [name] [name]  Procedure(s) Performed: Procedure(s): EXTRACTIONS #4, [phone] (Bilateral)  Patient Location: PACU  Anesthesia Type:General  Level of Consciousness: awake  Airway and Oxygen Therapy: Patient Spontanous Breathing  Post-op Pain: mild  Post-op Assessment: Post-op Vital signs reviewed  Post-op Vital Signs: Reviewed  Complications: No apparent anesthesia complications

## 2013-07-19 NOTE — Op Note (Signed)
07/19/2013  1:18 PM  PATIENT:  [name] [name]  37 y.o. female  PRE-OPERATIVE DIAGNOSIS:  Root tip # 5, Decay # [id number]    POST-OPERATIVE DIAGNOSIS:  Root tip #5, Decay # 4, 5,11,16,19  INDICATIONS FOR PROCEDURE: [name] is a delightful 37 year old female with a history significant for cerebral palsy with severe contracture of her limbs.  She has carious and non-restorable teeth present based on a limited intraoral exam and limited radiographic ability due to her severe contracture and inability to to participate in moving into the x-ray machine.  It was determined that we would go to the OR for an examination under anesthesia and extraction of any necessary teeth.  The plan was to remove #5, 11, 16, 19; however, other teeth would be removed if necessary if the exam revealed other non-restorable teeth.  PROCEDURE:  Procedure(s): EXTRACTIONS #4, [id number] (Bilateral)  SURGEON:  Surgeon(s) and Role:    * Francene Finders, DDS - Primary  PHYSICIAN ASSISTANT: None  ASSISTANTS: Harrie Foreman, Surgical Assistant   PROCEDURE IN DETAIL:  The patient was seen in the preoperative area. All questions were answered the history and physical was updated and verified.  The consent was reviewed and signed .  The patient was taken to the operating room by the anesthesia service.   Patient was placed on the table in the supine position. The patient was prepped and draped in the usual sterile fashion for all maxillofacial surgery procedures.  A moisten raytec was placed in the patient oropharynx. My examination under anesthesia revealed that teeth #'s 5, 11, 16, and 19 were non-restorable, and that #4 was also non-restorable.  Tooth #4 had decay extending into the dentin and pulp and the tooth was severely fractured. This was not originally noted on the radiograph because the patient could not tolerate the complete radiograph nor could she tolerate the exam.    Next, 2% Lidocaine with 1:100,000  epinephrine and 0.5% marcaine with 1:200,000 epinephrine was infiltrated around #4, 5, 11, 16, and 19.   Next a 15 blade was used to make a full thickness  mucoperiosteal flap along the sulcus of teeth #'s 4, 5, 11, 16, and 19.  Next, a periosteal elevator was used to raise the flap and hand instruments were used to remove crestal bone.  The teeth #'s 4, 5, 11, 16 were then elevated and removed. Note that #5 was a root tip and tooth #19 was sectioned and then removed.  Copious irrigation with normal saline was performed and then 3.0 chromic was used to close the wound.  All counts were correct times two.  Patient was extubated and taken to the PACU were she recovered well.  ANESTHESIA:   general  EBL:   10 mL   BLOOD ADMINISTERED:none  DRAINS: none   LOCAL MEDICATIONS USED:  0.5% MARCAINE with 1:200,000 epinephrine and 2% LIDOCAINE with 1:100,000 epinephrine   SPECIMEN:  No Specimen  DISPOSITION OF SPECIMEN:  N/A  COUNTS:  YES  TOURNIQUET:  * No tourniquets in log *  DICTATION: .Note written in EPIC  PLAN OF CARE: Discharge to home after PACU  PATIENT DISPOSITION:  PACU - hemodynamically stable.   Delay start of Pharmacological VTE agent (>24hrs) due to surgical blood loss or risk of bleeding: not applicable

## 2013-07-19 NOTE — Preoperative (Signed)
Beta Blockers   Reason not to administer Beta Blockers:Not Applicable 

## 2013-07-19 NOTE — Transfer of Care (Signed)
Immediate Anesthesia Transfer of Care Note  Patient: [name] [name]  Procedure(s) Performed: Procedure(s): EXTRACTIONS #4, [id number] (Bilateral)  Patient Location: PACU  Anesthesia Type:General  Level of Consciousness: awake, alert  and oriented  Airway & Oxygen Therapy: Patient Spontanous Breathing and Patient connected to nasal cannula oxygen  Post-op Assessment: Report given to PACU RN and Post -op Vital signs reviewed and stable  Post vital signs: Reviewed and stable  Complications: No apparent anesthesia complications

## 2013-07-19 NOTE — Anesthesia Preprocedure Evaluation (Signed)
Anesthesia Evaluation  Patient identified by MRN, date of birth, ID band Patient awake    Reviewed: Allergy & Precautions, H&P , NPO status , Patient's Chart, lab work & pertinent test results  Airway Mallampati: II TM Distance: <3 FB Neck ROM: Limited    Dental   Pulmonary shortness of breath,  breath sounds clear to auscultation        Cardiovascular hypertension, Rhythm:Regular Rate:Normal     Neuro/Psych  Headaches, Depression O/c disorder Severe depressionCerebral palsy    GI/Hepatic GERD-  ,  Endo/Other    Renal/GU      Musculoskeletal   Abdominal   Peds  Hematology   Anesthesia Other Findings   Reproductive/Obstetrics                           Anesthesia Physical Anesthesia Plan  ASA: III  Anesthesia Plan: General   Post-op Pain Management:    Induction: Intravenous  Airway Management Planned: Nasal ETT and Video Laryngoscope Planned  Additional Equipment:   Intra-op Plan:   Post-operative Plan: Extubation in OR  Informed Consent: I have reviewed the patients History and Physical, chart, labs and discussed the procedure including the risks, benefits and alternatives for the proposed anesthesia with the patient or authorized representative who has indicated his/her understanding and acceptance.     Plan Discussed with: CRNA and Surgeon  Anesthesia Plan Comments:         Anesthesia Quick Evaluation

## 2013-07-19 NOTE — Interval H&P Note (Signed)
History and Physical Interval Note:  07/19/2013 9:25 AM  [name] [name]  has presented today for surgery, with the diagnosis of root tip # 5, decay # [phone]    The various methods of treatment have been discussed with the patient and family. After consideration of risks, benefits and other options for treatment, the patient has consented to  Procedure(s): EXTRACTIONS # [phone] (Bilateral) and any necessary teeth as well as exam under anesthesia as a surgical intervention .  The patient's history has been reviewed, patient examined, no change in status, stable for surgery.  I have reviewed the patient's chart and labs.  Questions were answered to the patient's satisfaction.     Conrad,Kassidie Hendriks L

## 2013-07-19 NOTE — Anesthesia Procedure Notes (Signed)
Procedure Name: Intubation Date/Time: 07/19/2013 11:56 AM Performed by: Margaree Mackintosh Pre-anesthesia Checklist: Patient identified, Timeout performed, Emergency Drugs available, Suction available and Patient being monitored Patient Re-evaluated:Patient Re-evaluated prior to inductionOxygen Delivery Method: Circle system utilized Preoxygenation: Pre-oxygenation with 100% oxygen Intubation Type: IV induction Ventilation: Mask ventilation without difficulty Laryngoscope size: Glidescope. Nasal Tubes: Left and Nasal Rae Tube size: 6.5 mm Number of attempts: 1 Airway Equipment and Method: Video-laryngoscopy Placement Confirmation: ETT inserted through vocal cords under direct vision,  positive ETCO2 and breath sounds checked- equal and bilateral Tube secured with: Tape Dental Injury: Teeth and Oropharynx as per pre-operative assessment  Comments: Red rubber catheter utilized to place nasal tube into pharynx, then removed from tip of nasal rae. DL with glidescope nasal rae passed with ease through vocal cords

## 2013-07-19 NOTE — Brief Op Note (Signed)
07/19/2013  1:15 PM  PATIENT:  [name] [name]  37 y.o. female  PRE-OPERATIVE DIAGNOSIS:  Root tip # 5, Decay # [id number]    POST-OPERATIVE DIAGNOSIS:  Root tip #5, Decay # 4, 5,11,16,19  PROCEDURE:  Procedure(s): EXTRACTIONS #4, 1,61,09,60 (Bilateral)  SURGEON:  Surgeon(s) and Role:    * Francene Finders, DDS - Primary  PHYSICIAN ASSISTANT: None  ASSISTANTS: Harrie Foreman, Surgical Assistant   ANESTHESIA:   general  EBL:   10 mL   BLOOD ADMINISTERED:none  DRAINS: none   LOCAL MEDICATIONS USED:  0.5% MARCAINE with 1:200,000 epinephrine x 3 carpules and 2% LIDOCAINE with 1:100,000 epinephrine   SPECIMEN:  No Specimen  DISPOSITION OF SPECIMEN:  N/A  COUNTS:  YES  TOURNIQUET:  * No tourniquets in log *  DICTATION: .Note written in EPIC  PLAN OF CARE: Discharge to home after PACU  PATIENT DISPOSITION:  PACU - hemodynamically stable.   Delay start of Pharmacological VTE agent (>24hrs) due to surgical blood loss or risk of bleeding: not applicable

## 2013-07-20 ENCOUNTER — Encounter (HOSPITAL_COMMUNITY): Payer: Self-pay | Admitting: Oral and Maxillofacial Surgery

## 2013-07-20 ENCOUNTER — Encounter: Payer: Self-pay | Admitting: Pharmacist

## 2013-07-20 NOTE — Progress Notes (Signed)
Patient ID: [name] [name], female   DOB: Nov 22, 1976, 36 y.o.   MRN: [id number] Updated Nursing Home Med List

## 2013-07-23 NOTE — Assessment & Plan Note (Signed)
well controlled  

## 2013-07-23 NOTE — Assessment & Plan Note (Signed)
Continue current treatment for spasticity

## 2013-07-26 ENCOUNTER — Encounter: Payer: Self-pay | Admitting: Family Medicine

## 2013-07-27 ENCOUNTER — Non-Acute Institutional Stay (INDEPENDENT_AMBULATORY_CARE_PROVIDER_SITE_OTHER): Payer: Medicaid Other | Admitting: Family Medicine

## 2013-07-27 DIAGNOSIS — Z9350 Unspecified cystostomy status: Secondary | ICD-10-CM

## 2013-07-27 DIAGNOSIS — Z9359 Other cystostomy status: Secondary | ICD-10-CM

## 2013-07-27 NOTE — Progress Notes (Signed)
Nursing Home Progress Note [facility] Pager [phone]  Pt and staff complaining of persistent leakeage around the suprapubic catheter site since last urology appt. Of note catheter changed at that time and had a 22 guage urostomy catheter inserted. Pt of late typically w/ 24 gauge.   Pt w/o pain complaints. Skin around suprapubic cath site w/o irritation or breakdown from persistent moisture. Dry in diaper at time of exam.   Unable to obtain a 24 gauge catheter. Will have pt go to urology for switching out of catheters.   - order placed for urology appt  Shelly Flatten, MD Family Medicine PGY-3 07/27/2013, 3:52 PM

## 2013-07-27 NOTE — Assessment & Plan Note (Signed)
Persistent leakage since last urology appt when 22 gauge catheter placed Orders placed for urology appt to have 24 put back in.

## 2013-08-03 ENCOUNTER — Emergency Department (HOSPITAL_COMMUNITY)
Admission: EM | Admit: 2013-08-03 | Discharge: 2013-08-03 | Disposition: A | Discharge status: Home / Self Care | Payer: Medicaid Other | Attending: Emergency Medicine | Admitting: Emergency Medicine

## 2013-08-03 ENCOUNTER — Encounter (HOSPITAL_COMMUNITY): Payer: Self-pay | Admitting: Emergency Medicine

## 2013-08-03 DIAGNOSIS — Z8659 Personal history of other mental and behavioral disorders: Secondary | ICD-10-CM | POA: Insufficient documentation

## 2013-08-03 DIAGNOSIS — Z8669 Personal history of other diseases of the nervous system and sense organs: Secondary | ICD-10-CM | POA: Insufficient documentation

## 2013-08-03 DIAGNOSIS — Z7901 Long term (current) use of anticoagulants: Secondary | ICD-10-CM | POA: Insufficient documentation

## 2013-08-03 DIAGNOSIS — F329 Major depressive disorder, single episode, unspecified: Secondary | ICD-10-CM | POA: Insufficient documentation

## 2013-08-03 DIAGNOSIS — Z862 Personal history of diseases of the blood and blood-forming organs and certain disorders involving the immune mechanism: Secondary | ICD-10-CM | POA: Insufficient documentation

## 2013-08-03 DIAGNOSIS — Z9851 Tubal ligation status: Secondary | ICD-10-CM | POA: Insufficient documentation

## 2013-08-03 DIAGNOSIS — Z466 Encounter for fitting and adjustment of urinary device: Secondary | ICD-10-CM

## 2013-08-03 DIAGNOSIS — Z8742 Personal history of other diseases of the female genital tract: Secondary | ICD-10-CM | POA: Insufficient documentation

## 2013-08-03 DIAGNOSIS — Z86711 Personal history of pulmonary embolism: Secondary | ICD-10-CM | POA: Insufficient documentation

## 2013-08-03 DIAGNOSIS — T83091A Other mechanical complication of indwelling urethral catheter, initial encounter: Secondary | ICD-10-CM | POA: Insufficient documentation

## 2013-08-03 DIAGNOSIS — Y846 Urinary catheterization as the cause of abnormal reaction of the patient, or of later complication, without mention of misadventure at the time of the procedure: Secondary | ICD-10-CM | POA: Insufficient documentation

## 2013-08-03 DIAGNOSIS — Z8719 Personal history of other diseases of the digestive system: Secondary | ICD-10-CM | POA: Insufficient documentation

## 2013-08-03 DIAGNOSIS — R3915 Urgency of urination: Secondary | ICD-10-CM | POA: Insufficient documentation

## 2013-08-03 DIAGNOSIS — I10 Essential (primary) hypertension: Secondary | ICD-10-CM | POA: Insufficient documentation

## 2013-08-03 DIAGNOSIS — G43909 Migraine, unspecified, not intractable, without status migrainosus: Secondary | ICD-10-CM | POA: Insufficient documentation

## 2013-08-03 DIAGNOSIS — F3289 Other specified depressive episodes: Secondary | ICD-10-CM | POA: Insufficient documentation

## 2013-08-03 DIAGNOSIS — Z79899 Other long term (current) drug therapy: Secondary | ICD-10-CM | POA: Insufficient documentation

## 2013-08-03 DIAGNOSIS — Z87440 Personal history of urinary (tract) infections: Secondary | ICD-10-CM | POA: Insufficient documentation

## 2013-08-03 DIAGNOSIS — M1990 Unspecified osteoarthritis, unspecified site: Secondary | ICD-10-CM | POA: Insufficient documentation

## 2013-08-03 LAB — URINALYSIS, ROUTINE W REFLEX MICROSCOPIC
Bilirubin Urine: NEGATIVE
Ketones, ur: NEGATIVE mg/dL
Protein, ur: 100 mg/dL — AB
Urobilinogen, UA: 0.2 mg/dL (ref 0.0–1.0)

## 2013-08-03 NOTE — ED Notes (Signed)
Bed: ZO10 Expected date:  Expected time:  Means of arrival:  Comments: 37 y/o F gtube problem

## 2013-08-03 NOTE — ED Notes (Signed)
[facility] called  

## 2013-08-03 NOTE — ED Provider Notes (Signed)
CSN: [id number]     Arrival date & time 08/03/13  1319 History   First MD Initiated Contact with Patient 08/03/13 1347     Chief Complaint  Patient presents with  . Vascular Access Problem   (Consider location/radiation/quality/duration/timing/severity/associated sxs/prior Treatment) HPI Comments: 37 year old female with an extensive past medical history including cerebral palsy, PE, hypertension, OCD, endometriosis and dysarthria presents to the emergency department via EMS with her father complaining of leaking from her suprapubic catheter since 8/20. Patient and father state on 8/20 she was due for a catheter change, however it was changed from a size 24 to a 22. Since then the catheter has been leaking and she has been urinating in the diaper since she has an increased urge to urinate. Normally has difficulty urinating which is why she has a catheter. Urologist Dr. Logan Bores at [facility] facility wanted the catheter evaluated. Patient states the area around catheter insertion hurts. Has an appointment with Dr. Logan Bores on 9/18, cannot be seen any earlier. Denies fever, chills, nausea, vomiting, abdominal pain.  The history is provided by the patient and a parent.    Past Medical History  Diagnosis Date  . Cerebral palsy   . Pulmonary embolism     Lifetime Coumadin  . Cerebral palsy     Spastic Cerebral palsy, mentally intact  . Contracture, joint, multiple sites     Electric wheelchair, uses left hand to operate chair.   [name] Kitchen Hypertension   . OCD (obsessive compulsive disorder)   . Depression   . Migraine   . [name]   . Dysarthria   . Endometriosis   . History of recurrent UTIs   . GERD (gastroesophageal reflux disease)   . Pulmonary embolism 2011, 01/2011    Will be on lifetime coumadin  . Anemia    Past Surgical History  Procedure Laterality Date  . Colposcopy  06/2000  . Cesarean section      x 2  . Tubal ligation  2003  . Carpal  tunnel release  08/2008    Dr Teressa Senter  . Wrist surgery  06/2010    Dr Dierdre Searles, hand surgeon, [name]  . Baclofen pump refill      x 3 times  . Appendectomy    . Intrauterine device insertion  04/30/11    Inserted by [facility] for endometriosis  . Laparoscopically assisted vag hysterectomy  10/27/2012  . Multiple extractions with alveoloplasty Bilateral 07/19/2013    Procedure: EXTRACTIONS #4, 9,14,78,29;  Surgeon: Francene Finders, DDS;  Location: [facility] OR;  Service: Oral Surgery;  Laterality: Bilateral;   Family History  Problem Relation Age of Onset  . Asthma Father   . Cancer Maternal Grandmother   . Cancer Paternal Grandmother    History  Substance Use Topics  . Smoking status: Never Smoker   . Smokeless tobacco: Never Used  . Alcohol Use: 0.6 oz/week    1 Glasses of wine per week   OB History   Grav Para Term Preterm Abortions TAB SAB Ect Mult Living                 Review of Systems  Constitutional: Negative for fever and chills.  Gastrointestinal: Negative for nausea and abdominal pain.  Genitourinary: Positive for urgency.  All other systems reviewed and are negative.    Allergies  Morphine  Home Medications   Current Outpatient Rx  Name  Route  Sig  Dispense  Refill  . acetic  acid-hydrocortisone (VOSOL-HC) otic solution   Both Ears   Place 4 drops into both ears every 7 (seven) days. Wednesdays         . ARIPiprazole (ABILIFY) 5 MG tablet   Oral   Take 5 mg by mouth every morning.         . baclofen (LIORESAL) 10 MG tablet   Oral   Take 20 mg by mouth 3 (three) times daily.          [name] Kitchen buPROPion (WELLBUTRIN SR) 150 MG 12 hr tablet   Oral   Take 150 mg by mouth at bedtime.         . clonazePAM (KLONOPIN) 1 MG tablet   Oral   Take 1 tablet (1 mg total) by mouth 4 (four) times daily.   120 tablet   2   . Hydrocodone-Acetaminophen 5-300 MG TABS   Oral   Take 1 tablet by mouth 4 (four) times daily as needed (pain).          [name] Kitchen ibuprofen  (ADVIL,MOTRIN) 200 MG tablet   Oral   Take 400 mg by mouth every 8 (eight) hours as needed for pain.         [name] Kitchen lamoTRIgine (LAMICTAL) 200 MG tablet   Oral   Take 200 mg by mouth at bedtime.          [name] Kitchen loratadine (CLARITIN) 10 MG tablet   Oral   Take 10 mg by mouth every evening.          . Multiple Vitamin (MULTIVITAMIN WITH MINERALS) TABS   Oral   Take 1 tablet by mouth every evening.          . polyethylene glycol (MIRALAX / GLYCOLAX) packet   Oral   Take 17 g by mouth 2 (two) times daily.         . pseudoephedrine (SUDAFED) 30 MG tablet   Oral   Take 30 mg by mouth every 6 (six) hours as needed for congestion.         . QUEtiapine (SEROQUEL) 50 MG tablet   Oral   Take 50 mg by mouth at bedtime.         . Rivaroxaban (XARELTO) 20 MG TABS   Oral   Take 1 tablet (20 mg total) by mouth daily with supper.   30 tablet      . sertraline (ZOLOFT) 100 MG tablet   Oral   Take 200 mg by mouth at bedtime.           . SUMAtriptan (IMITREX) 100 MG tablet   Oral   Take 100 mg by mouth every 2 (two) hours as needed for migraine.          [name] Kitchen tiZANidine (ZANAFLEX) 4 MG tablet   Oral   Take 4 mg by mouth at bedtime.          . topiramate (TOPAMAX) 100 MG tablet   Oral   Take 100 mg by mouth at bedtime.          . traMADol (ULTRAM) 50 MG tablet   Oral   Take 50 mg by mouth every 6 (six) hours as needed for pain.          [name] Kitchen triamcinolone (KENALOG) 0.025 % cream   Topical   Apply 1 application topically 2 (two) times daily. For contact dermatitis.          BP 95/74  Pulse 79  Temp(Src) 98.2 F (36.8 C) (Oral)  Resp 16  SpO2 100%  LMP 04/05/2011 Physical Exam  Nursing note and vitals reviewed. Constitutional: She is oriented to person, place, and time. She appears well-developed and well-nourished. No distress.  HENT:  Head: Normocephalic and atraumatic.  Mouth/Throat: Oropharynx is clear and moist.  Eyes: Conjunctivae are normal.  Neck:  Normal range of motion.  Cardiovascular: Normal rate, regular rhythm and intact distal pulses.   Pulmonary/Chest: Effort normal and breath sounds normal.  Abdominal: Soft. Normal appearance and bowel sounds are normal. She exhibits no distension and no mass. There is no tenderness. There is no rigidity, no rebound, no guarding and no CVA tenderness.    Musculoskeletal: Normal range of motion. She exhibits no edema.  Neurological: She is alert and oriented to person, place, and time.  Skin: Skin is warm and dry. She is not diaphoretic.  Psychiatric: She has a normal mood and affect. Her behavior is normal.    ED Course  Procedures (including critical care time) Suprapubic catheter replaced with 24 F. Labs Review Labs Reviewed  URINALYSIS, ROUTINE W REFLEX MICROSCOPIC - Abnormal; Notable for the following:    APPearance TURBID (*)    Hgb urine dipstick MODERATE (*)    Protein, ur 100 (*)    Leukocytes, UA LARGE (*)    All other components within normal limits  URINE MICROSCOPIC-ADD ON - Abnormal; Notable for the following:    Bacteria, UA MANY (*)    All other components within normal limits   Imaging Review No results found.  MDM   1. Encounter for replacement of urinary catheter    Patient presenting with leaking suprapubic catheter since it was replaced with a 22 rather than 24 on 8/20 by urologist Dr. Logan Bores at [facility]. I spoke with Dr. Logan Bores who states to put a regular size 24 foley catheter in at bedside. Also advised not to treat the bacteria in urine as she has chronic bacteruria. He is not concerned of leakage around the entrance of catheter. No signs of infection around catheter site. Afebrile and in NAD. Catheter was replaced in a sterile fashion, accompanied by attending Dr. Micheline Maze. Normal urine flow through catheter. Patient will f/u with Dr. Logan Bores. Return precautions discussed with patient and dad who state their understanding of plan and are  agreeable.    Trevor Mace, PA-C 08/04/13 2337

## 2013-08-03 NOTE — ED Notes (Signed)
MD and PA at bedside for catheter re-insertion.

## 2013-08-03 NOTE — ED Notes (Signed)
[facility] called

## 2013-08-03 NOTE — ED Notes (Signed)
Per EMS-pt c/o of suprapubic catheter leakage since 8/20. Catheter on 8/20 not resolved. Sent here for evaluation. Urologist appt on 9/18.

## 2013-08-04 ENCOUNTER — Encounter: Payer: Self-pay | Admitting: Family Medicine

## 2013-08-04 ENCOUNTER — Non-Acute Institutional Stay: Payer: Medicaid Other | Admitting: Family Medicine

## 2013-08-04 DIAGNOSIS — R310 Gross hematuria: Secondary | ICD-10-CM

## 2013-08-04 DIAGNOSIS — Z9359 Other cystostomy status: Secondary | ICD-10-CM

## 2013-08-04 DIAGNOSIS — Z9350 Unspecified cystostomy status: Secondary | ICD-10-CM
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
## 2013-08-05 NOTE — ED Provider Notes (Signed)
Medical screening examination/treatment/procedure(s) were performed by non-physician practitioner and as supervising physician I was immediately available for consultation/collaboration.   Shanna Cisco, MD 08/05/13 2232

## 2013-08-18 ENCOUNTER — Non-Acute Institutional Stay: Payer: Medicaid Other | Admitting: Family Medicine

## 2013-08-18 DIAGNOSIS — R300 Dysuria: Secondary | ICD-10-CM | POA: Insufficient documentation

## 2013-08-18 NOTE — Progress Notes (Addendum)
Nursing Home Progress Note Family Medicine Geriatric Team Pager [phone]  S: Ms. Bisch has had persistent urinary incontinence which started after her suprapubic cath was replaced at her last urology appointment. She has been complaining of persistent dysuria. Recent cath urine and urine cx showed UTI w/ SM and sensitivities were used to guide therapy of Cipro for 7 days. No improvement in pain. Lower abdominal pain improved w/ treatemt  PE:  Gen: no distress, contracted Res: nml effort GU: suprapubic cath in place w/o evidence of leak. No skin breakdown surounding cath. Mild white discharge/buildup between labial folds and w/in vaginal meatus.  Skin: warm well perfused and intact.   A/P:  37 yo F wheelchair bound w/ CP and w/ suprapubic cath w/ persistent dysuria despite treatment w/ Cipro. Urine cultures reviewed adn will treat w/ Bactrim DS x 5 days followed by Diflucan 150mg  once then repeat in 3 days. Of note suprapubic catheter has flushed/functioned w/o difficulty since replacement in ED on 9/2.  Pt to benefit from planned Urology appt tomorrow where cath can be properly evaluated.   Shelly Flatten, MD Family Medicine PGY-3 08/18/2013, 12:45 PM  Attending Addendum  I examined the patient and discussed the assessment and plan with Dr. Konrad Dolores. I have reviewed the note and agree.    Dessa Phi, MD FAMILY MEDICINE TEACHING SERVICE

## 2013-08-18 NOTE — Assessment & Plan Note (Signed)
Recent treatment for confirmed symptomatic UTI w/ Cipro. Continues to be symptomatic w/ dysuria. UCX reviewed and sensitive to Bactrim. Also w/ persistent incontinence despite functioning suprapubic cath. - Bactrim DS x5 days - urology appt on 9/18 - Diflucan for possible secondary yeast vaginitis (start after completion of Bactrim)

## 2013-08-19 MED ORDER — SULFAMETHOXAZOLE-TMP DS 800-160 MG PO TABS: 1.0000 | ORAL_TABLET | Freq: Two times a day (BID) | ORAL | Status: AC

## 2013-08-19 NOTE — Progress Notes (Signed)
Patient ID: [name] [name], female   DOB: 20-Oct-1976, 37 y.o.   MRN: [id number]  Attending Addendum  I examined the patient and discussed the assessment and plan with Dr. Konrad Dolores. I have reviewed the note and agree.  Dessa Phi, MD  FAMILY MEDICINE TEACHING SERVICE

## 2013-08-19 NOTE — Addendum Note (Signed)
Addended by: Dessa Phi on: 08/19/2013 02:49 PM   Modules accepted: Orders, Level of Service

## 2013-08-20 ENCOUNTER — Encounter: Payer: Self-pay | Admitting: Pharmacist

## 2013-08-22 DIAGNOSIS — N3941 Urge incontinence: Secondary | ICD-10-CM | POA: Insufficient documentation

## 2013-08-27 ENCOUNTER — Encounter: Payer: Medicaid Other | Admitting: Pediatrics

## 2013-08-31 ENCOUNTER — Telehealth: Payer: Self-pay | Admitting: *Deleted

## 2013-08-31 NOTE — Telephone Encounter (Signed)
Pt drove to clinic and request to be seen by our resident please. She is at [facility]. I told the pt that I was send a message to Dr.Hairford and her PCP.  Thank you. Lorenda Hatchet, Renato Battles

## 2013-09-01 ENCOUNTER — Ambulatory Visit (INDEPENDENT_AMBULATORY_CARE_PROVIDER_SITE_OTHER): Payer: Medicaid Other | Admitting: Pediatrics

## 2013-09-01 ENCOUNTER — Encounter: Payer: Self-pay | Admitting: Pediatrics

## 2013-09-01 ENCOUNTER — Encounter: Payer: Medicaid Other | Admitting: Pediatrics

## 2013-09-01 DIAGNOSIS — G808 Other cerebral palsy: Secondary | ICD-10-CM

## 2013-09-01 DIAGNOSIS — M545 Low back pain, unspecified: Secondary | ICD-10-CM
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
## 2013-09-01 NOTE — Telephone Encounter (Signed)
I will round on patient at nursing home at my earliest convenience. Dr. Armen Pickup touched base with patient and nurses concerning her pain. Will address when I see her.  Thanks, [employer]. Sherrick Araki, M.D.

## 2013-09-03 ENCOUNTER — Other Ambulatory Visit: Payer: Self-pay | Admitting: Family Medicine

## 2013-09-03 MED ORDER — HYDROCODONE-ACETAMINOPHEN 5-325 MG PO TABS: 1.0000 | ORAL_TABLET | Freq: Two times a day (BID) | ORAL | Status: DC | PRN

## 2013-09-03 NOTE — Telephone Encounter (Signed)
Refilled med in response to faxed request from [facility]   vicodin 5/325 BID prn 14 tabs

## 2013-09-09 ENCOUNTER — Other Ambulatory Visit: Payer: Self-pay | Admitting: Family Medicine

## 2013-09-09 MED ORDER — ACETAMINOPHEN 325 MG PO TABS: 650.0000 mg | ORAL_TABLET | Freq: Three times a day (TID) | ORAL | Status: DC

## 2013-09-09 MED ORDER — HYDROCODONE-ACETAMINOPHEN 5-325 MG PO TABS: 1.0000 | ORAL_TABLET | Freq: Every day | ORAL | Status: DC | PRN

## 2013-09-09 NOTE — Telephone Encounter (Signed)
Refilled med in response to faxed request from [facility]   Refilled vicodin 1 tab daily prn severe pain, 15 tabs, 0 refills.

## 2013-09-15 ENCOUNTER — Telehealth: Payer: Self-pay | Admitting: Sports Medicine

## 2013-09-15 NOTE — Telephone Encounter (Signed)
[facility] 24 Hour After-hours Emergency Line  Patient name: [name] [name]  MRN: [id number]  AGE: 37 y.o.  Gender: female DOB: Jun 10, 1976     Primary Care Provider:   [name], MD     Called from [name] [name] nursing home regarding persistent hematuria.  Question wether Xarelto should be restarted tonight; has been held for 4 days for persistent Hematuria.  Told to continue holding tonight and will route to Geri-resident and PCP to address in the AM.   --- DISPOSITION: Hold Xarelto until acute worsening of persistent Hematuria addressed by Primary GERI team tomorrow.  Likely okay to restart but given I am unable to examine her, I feel the risk of significant bleed outweighs likelihood of a VTE episode given she already been off Xarelto.     Andrena Mews, DO [facility] Resident - PGY-3 09/15/2013 7:42 PM

## 2013-09-16 ENCOUNTER — Non-Acute Institutional Stay: Payer: No Typology Code available for payment source | Admitting: Family Medicine

## 2013-09-16 DIAGNOSIS — Z593 Problems related to living in residential institution: Secondary | ICD-10-CM

## 2013-09-16 NOTE — Progress Notes (Signed)
  Subjective:    Patient ID: [name] [name], female    DOB: 1976-08-16, 37 y.o.   MRN: [id number]  HPI Note opened in error.    Review of Systems       Objective:   Physical Exam       Assessment & Plan:

## 2013-09-18 ENCOUNTER — Telehealth: Payer: Self-pay | Admitting: Family Medicine

## 2013-09-18 NOTE — Telephone Encounter (Signed)
After hours line:  Received another call about Ms. Bradhams hematuria. She is concerned that it is not resolved. Vital signs normal. Most likely due to her cystoscopy and being on Xarelto, as previously discussed. Awaiting CBC. No indication for ED transfer at this time. Will eval on Monday and talk to urology at that time.  Laura Caldas M. Toure Edmonds, M.D.

## 2013-09-18 NOTE — Telephone Encounter (Signed)
After Hours Call:  Yolanda from [facility] called to state [name] [name] con't to have bright red blood in her catheter bag. She had cystoscopy on 09/13/13. Restarted on Xarelto due to risk/benefit of known multiple PE. She is afebrile and feels well. Will get CBC to evaluate for acute blood loss. Will call back if she has temp, etc.  I will evaluate Ms. Hegner first thing on Monday morning and call her urologist for recommendations at that time. For now, con't to monitor.  Thanks, [employer]. Ticia Virgo, M.D.

## 2013-09-19 ENCOUNTER — Telehealth: Payer: Self-pay | Admitting: Family Medicine

## 2013-09-19 NOTE — Telephone Encounter (Signed)
After hours line:   Nurse is called to inform labs results. Hb is 8.8 with Hto 27.4. Pt has had cytoscopy and is on Xarelto, she has had episodes of macroscopic hematuria since the procedure that could be the cause of her acute anemia. Per nurse report pt is not currently bleeding, she is stable, no dizziness, no chest pain, but she needed to inform this abnormal results. No indication for transfusion at this time. We agree with evaluation tomorrow as has been discussed in previous phone calls during this weekend.     D. Piloto Rolene Arbour, MD Family Medicine  PGY-3

## 2013-09-20 ENCOUNTER — Other Ambulatory Visit: Payer: Self-pay | Admitting: Family Medicine

## 2013-09-21 MED ORDER — HYDROCODONE-ACETAMINOPHEN 5-325 MG PO TABS: 1.0000 | ORAL_TABLET | Freq: Every day | ORAL | Status: DC | PRN

## 2013-09-21 NOTE — Telephone Encounter (Signed)
Refilled med in response to faxed request from [facility]   vicodin 5/32 sent in. Sent in 15 tabs.

## 2013-09-29 ENCOUNTER — Encounter: Payer: Self-pay | Admitting: Pharmacist

## 2013-09-30 ENCOUNTER — Encounter: Payer: Self-pay | Admitting: Neurology

## 2013-09-30 ENCOUNTER — Ambulatory Visit (INDEPENDENT_AMBULATORY_CARE_PROVIDER_SITE_OTHER): Payer: Medicaid Other | Admitting: Neurology

## 2013-09-30 DIAGNOSIS — R252 Cramp and spasm: Secondary | ICD-10-CM

## 2013-09-30 DIAGNOSIS — G808 Other cerebral palsy: Secondary | ICD-10-CM

## 2013-09-30 MED ORDER — ONABOTULINUMTOXINA 100 UNITS IJ SOLR
200.0000 [IU] | Freq: Once | INTRAMUSCULAR | Status: AC
  Administered 2013-09-30: 200 [IU] via INTRAMUSCULAR

## 2013-09-30 NOTE — Progress Notes (Signed)
HPI:   [name] [name] is a 37 year old Female with spastic and dystonic quadriparesis, depression/ anxiety, episodes where she loses contact with her world and that have been studied thoroughly and appear nonepileptic in nature, migraine headaches.  She is currently taking coumadin for her previous DVT, recurrent pulmonary emboli.  She came in for EMG guided BOTOX A injection, previously was done by Dr. Kelli Hope since Feb, 2006, received injection at [facility] prior to that.   BOTOX has always been to her right arm, where she often complains of spastic pain, her left arm even though has significant spasticity, is her functional arm.  She used her left arm to manipulate wheelchair, operating cellphone and feed herself.  She also has intrathecal baclofen pump. She used to be able to transfer herself in/out of wheelchair, living in NH now she has gradually worsening functional capacity over the past few month, especially since her recent hospital admission in August 2012. She complained of syncopy, there was associated hypoxia to 83%, CT angiogram of bran and neck were normal. EEG ws normal. abilify and klonopin dosage were decreased  She was discharged to nursing home now, she could no longer transfer herself in and out of wheelchair, she has less functional left arm.  UPDATE Oct 30th 2014: Last injection was in July 25th 2014, she received injection to her right arm, and also 25 units to her left flexor digitorum profundus to help relieve her left fingers, which has been very helpful.    She now complains of right wrist flexion spasm, right shoulder discomfort , could barely maintain her body and neck straight, came in with a headbrace to keep her head straight.   She also complains of continued worsening of her left arm function, her left hand balled up, she wants some Botox injection to her left arm to relieve her left fingers, she uses her left hand to manipulate her wheelchair.  Physical  Examinations:  Mental Status: seated in wheelchair, slumped slightly, with head band and chest vest to hold her body posture.  Cranial Nerves: round minimally reactive pupils,  conjugate eye movement, dystonic but symmetric facial strength, difficulty protruding her tongue, severe dysarthria to the point of being in unintelligible at times Motor: right arm in shoulder anterior rotation, pronation, elbow flexion, wrist and finger flexion, when extend, she has uncontrollable right shoulder posterior extension.  Left arm able to have antigravity movement, stayed in left elbow pronation. The left hand was fisted but she was able to open and close her left hand extending and flexing her fingers slowly and with considerable effort.  She was able to move her legs against gravity.  Sensory: Withdrawal x4 Coordination:  poor coordination Gait and Station: spastic diplegia, in wheelchair   Assessment and Plan:  37 yo Caucasian female with spastic quadriplegia, under EMG guidance, 200 units of BOTOX A were injected,  (Lot No, [id number], Exp March 2017), 100 units were dissolved into 2 cc of NS.  Right arm 175 units, left arm  25 unit  Right biceps 25 Right brachialis 25 Right flexor carpi ulnaris 25 Right flexor carpi radialis 25   Right teres major  25x2=50 Right Rhomboid 25 units  Left flexor digitorum profundus 25 units   She tolerates the injection well, will return in 3 months for repeat injections.

## 2013-10-15 ENCOUNTER — Other Ambulatory Visit: Payer: Self-pay | Admitting: Family Medicine

## 2013-10-15 MED ORDER — HYDROCODONE-ACETAMINOPHEN 5-325 MG PO TABS: 1.0000 | ORAL_TABLET | Freq: Every day | ORAL | Status: DC | PRN

## 2013-10-16 ENCOUNTER — Non-Acute Institutional Stay (INDEPENDENT_AMBULATORY_CARE_PROVIDER_SITE_OTHER): Payer: Medicaid Other | Admitting: Family Medicine

## 2013-10-16 DIAGNOSIS — M2450 Contracture, unspecified joint: Secondary | ICD-10-CM

## 2013-10-16 DIAGNOSIS — R404 Transient alteration of awareness: Secondary | ICD-10-CM

## 2013-10-16 DIAGNOSIS — R400 Somnolence: Secondary | ICD-10-CM | POA: Insufficient documentation

## 2013-10-16 DIAGNOSIS — G894 Chronic pain syndrome: Secondary | ICD-10-CM

## 2013-10-16 NOTE — Assessment & Plan Note (Signed)
S/p botox injections in October which benefit patient. Follow up in 3 months for repeat injections. Patient managed by Dr. Sharene Skeans for CP and contractures.

## 2013-10-16 NOTE — Assessment & Plan Note (Addendum)
lidoderm patch added by geriatric team for low back pain which appears to have helped. Continue baclofen, tramadol prn and vicodin prn. With family's concern for drowsiness, will monitor sedating effects of medications.

## 2013-10-16 NOTE — Assessment & Plan Note (Signed)
On multiple potentially sedating medications, but none that have been newly added except for lidoderm patch. Will continue to monitor. differential includes polypharmacy vs uncontrolled depression vs sleep apnea. If continues to be an issue, will repeat phq9 and will consider continuous pulse ox at bedtime to assess for any O2 drops to rule out sleep apnea.

## 2013-10-16 NOTE — Progress Notes (Signed)
Patient ID: [name] [name]    DOB: 1976/05/24, 37 y.o.   MRN: [id number] --- Subjective:  [name] [name] is a 37 y.o.female with h/o spastic cerebral palsy, depression, PE on xarelto, chronic suprapubic catheter,  who was seen at [facility] on 10/01/13.  - contractures in left hand: received botox a couple of days prior which seemed to help. Able to use her phone with right hand.  - Concern by friend about drowsiness: present in the afternoons. Ongoing for the last 3 weeks. No new medications other than the lidoderm patch started 1 day before visit.   ROS: see HPI Past Medical History: reviewed and updated medications and allergies. Social History: Tobacco: none  Objective: BP: 102/71, HR: 82, T: 98.2, weight: 114.6lbs Physical Examination:   General appearance - alert, well appearing, and in no distress, sitting in motorized chair Chest - clear to auscultation, no wheezes, rales or rhonchi, symmetric air entry Heart - normal rate, regular rhythm, normal S1, S2, no murmurs, rubs, clicks or gallops Abdomen - soft, nontender, nondistended, suprapubic catheter in place with mucous discharge. No erythema or warmth surrounding it.  Neuro - left hand: able to grasp fingers and move left arm at shoulder level. Right arm contracted backwards without purposeful movement.

## 2013-10-21 ENCOUNTER — Non-Acute Institutional Stay (INDEPENDENT_AMBULATORY_CARE_PROVIDER_SITE_OTHER): Payer: Medicaid Other | Admitting: Emergency Medicine

## 2013-10-21 DIAGNOSIS — T83091A Other mechanical complication of indwelling urethral catheter, initial encounter: Secondary | ICD-10-CM

## 2013-10-21 DIAGNOSIS — T83010A Breakdown (mechanical) of cystostomy catheter, initial encounter: Secondary | ICD-10-CM

## 2013-10-21 NOTE — Progress Notes (Signed)
Patient ID: [name] [name], female   DOB: Nov 29, 1976, 37 y.o.   MRN: [id number] Nursing Home Resident Note ID: [name] is a 37 yo woman with CP, wheelchair bound, depression, recurrent PE on xarelto.  S: Notified by RN that patient was complaining of some difficulty with her suprapubic catheter.  When I spoke with the patient she reports that sometime today, the catheter stopped working.  She can tell because she is leaking urine in her diaper.  There is some burning associated with this leakage.  She states it was working well 2 days ago.  The urine bag was last emptied last night.  Denies any fevers or abdominal discomfort.  O:  Lying comfortably in bed, NAD Abd: soft, non tender; catheter appears to be in place, no leakage around the catheter appreciated, urine bag with about 800cc of urine, moderate amount of sediment present  A/P: 37 yo woman with CP with possible suprapubic catheter obstruction.  No signs of acute urinary retention. - will have staff empty urine bag to better assess urine output via catheter - RN to attempt to flush the catheter - if flushes, then likely fine; if does not flush, will need referral back to urology for further management.  Tonjia Parillo 10/21/2013, 3:58 PM

## 2013-10-22 ENCOUNTER — Other Ambulatory Visit: Payer: Self-pay | Admitting: Family Medicine

## 2013-10-22 ENCOUNTER — Telehealth: Payer: Self-pay | Admitting: Family Medicine

## 2013-10-22 ENCOUNTER — Telehealth: Payer: Self-pay | Admitting: *Deleted

## 2013-10-22 MED ORDER — CLONAZEPAM 1 MG PO TABS: 1.0000 mg | ORAL_TABLET | Freq: Four times a day (QID) | ORAL | Status: DC

## 2013-10-22 NOTE — Telephone Encounter (Signed)
Refilled med in response to faxed request from [facility]   Refilled clonazepam, sent in 120.

## 2013-10-22 NOTE — Telephone Encounter (Signed)
Received a call from [name] [name] wanting to change the order to change [name] [name]'s suprapubic cath from to today to tomorrow. I spoke with Dr Gwenlyn Saran and she said it is ok to change the cath tomorrow. I gave this order verbally to [name] [name] at [place], Rodena Medin

## 2013-10-25 ENCOUNTER — Encounter: Payer: Self-pay | Admitting: Pharmacist

## 2013-11-10 ENCOUNTER — Telehealth: Payer: Self-pay | Admitting: Family Medicine

## 2013-11-10 NOTE — Telephone Encounter (Signed)
[facility] called for Clorine and she would like a increase on her Vicodin medication. jw

## 2013-11-10 NOTE — Telephone Encounter (Signed)
Will fwd. To PCP to address. Thanks. Lorenda Hatchet, Renato Battles

## 2013-11-12 NOTE — Telephone Encounter (Signed)
Will discuss with geriatric team.   [name], PGY-3 Family Medicine Resident

## 2013-11-17 ENCOUNTER — Non-Acute Institutional Stay: Payer: Medicaid Other | Admitting: Family Medicine

## 2013-11-17 DIAGNOSIS — I10 Essential (primary) hypertension: Secondary | ICD-10-CM

## 2013-11-17 DIAGNOSIS — H60399 Other infective otitis externa, unspecified ear: Secondary | ICD-10-CM

## 2013-11-17 DIAGNOSIS — I2699 Other pulmonary embolism without acute cor pulmonale: Secondary | ICD-10-CM

## 2013-11-17 DIAGNOSIS — H6001 Abscess of right external ear: Secondary | ICD-10-CM

## 2013-11-18 ENCOUNTER — Encounter: Payer: Self-pay | Admitting: Family Medicine

## 2013-11-18 DIAGNOSIS — H6001 Abscess of right external ear: Secondary | ICD-10-CM | POA: Insufficient documentation

## 2013-11-18 MED ORDER — FLUCONAZOLE 150 MG PO TABS: 150.0000 mg | ORAL_TABLET | Freq: Once | ORAL | Status: DC

## 2013-11-18 NOTE — Progress Notes (Signed)
   [facility] Attending [facility] note   Subjective:    Patient ID: [name] [name], female    DOB: 09/12/1976, 37 y.o.   MRN: [id number]  HPI 37 yo F NH patient with cerebal palsy resulting in chronic contracture and neurogenic bladder is seen for routine f/u.  Patient complaints/concerns: none. She does announce that she became engaged to her girlfriend of 2 years. 2 weeks ago. She is happy. Her children are happy. Her father is not happy.   Regarding her chronic back pain, she is taking prn vicodin. She is pleased that she can receive it BID if needed. She reports rarely needing it this often.   Acute events:  R ear abscess on pinna: on doxycycline 100 mg BID started on 11/12/13. Reports improvement in pain. No fevers.    Review of Systems As per HPI      Objective:   Physical Exam BP 103/71  Pulse 84  Temp(Src) 97.2 F (36.2 C)  Resp 20  LMP 04/05/2011 General appearance: alert, cooperative and no distress Ears: R pinna with erythema and edema, central swollen area with mild scaling. no discharge. mild TTP.  nontender or erythematous along mastoid or tragus. Lungs: clear to auscultation bilaterally Heart: regular rate and rhythm, S1, S2 normal, no murmur, click, rub or gallop Extremities: no edema      Assessment & Plan:

## 2013-11-18 NOTE — Assessment & Plan Note (Signed)
A: R ear abscess. Improving with doxycycline day 6/10.  P: Continue doxy.  Gave one dose of diflucan 150 mg PO x one today.  Give another dose of diflucan 150 mg PO x one on 11/22/13.

## 2013-11-18 NOTE — Assessment & Plan Note (Signed)
No longer hypertensive.  Not on meds Will resolve problem

## 2013-11-18 NOTE — Assessment & Plan Note (Signed)
A: on chronic xarelto. No bleeding on SOB. P: continue xarelto

## 2013-12-17 ENCOUNTER — Encounter: Payer: Self-pay | Admitting: Pharmacist

## 2013-12-24 ENCOUNTER — Non-Acute Institutional Stay (INDEPENDENT_AMBULATORY_CARE_PROVIDER_SITE_OTHER): Payer: Medicaid Other | Admitting: Family Medicine

## 2013-12-24 ENCOUNTER — Telehealth: Payer: Self-pay | Admitting: Family Medicine

## 2013-12-24 DIAGNOSIS — G801 Spastic diplegic cerebral palsy: Secondary | ICD-10-CM

## 2013-12-24 DIAGNOSIS — F339 Major depressive disorder, recurrent, unspecified: Secondary | ICD-10-CM

## 2013-12-24 DIAGNOSIS — G809 Cerebral palsy, unspecified: Secondary | ICD-10-CM

## 2013-12-24 MED ORDER — TIZANIDINE HCL 4 MG PO CAPS: 8.0000 mg | ORAL_CAPSULE | Freq: Every day | ORAL | Status: DC

## 2013-12-24 NOTE — Assessment & Plan Note (Signed)
Increased spasticity of her left dominant hand which she uses for operation of her wheelchair. She has lost ability to use her phone.  Spoke with Dr. Sharene SkeansHickling, patient's neurologist, who told me that this is unfortunately likely part of the neurological aging process of a patient with spastic CP. He recommended to continue baclofen at 20 qid which is the max dose and to continue tizanidine. Recent changes in med will take a few days to start taking effect. He doesn't recommend any further imaging at this point, as this is the natural progression of her disease state.   - continue botox injections as scheduled. Cannot do it more frequently than every 3-4 months for concern of antibody formation and loss of effect of botox.  - continue baclofen. Consider increasing tizanidine to max dose - continue OT 5 times a week with ultrasound therapy and weight bearing exercises

## 2013-12-24 NOTE — Assessment & Plan Note (Signed)
Current state of loss of function of her left hand is likely going to impact her depression. Already on max of zoloft. On abilify 5mg . Could consider increasing dose. Continue to monitor.

## 2013-12-24 NOTE — Telephone Encounter (Signed)
At request of the patient's friend, I called the patient's father to explain the worsening of the contracture of her right hand and the input from Dr. Sharene SkeansHickling. He voiced understanding.

## 2013-12-24 NOTE — Progress Notes (Signed)
Patient ID: [name] SlickerJudith Scibilia    DOB: 1976-04-04, 38 y.o.   MRN: [id number] --- Subjective:  [name] ClosJudith is a 38 y.o.female, [facility] with h/o spastic CP who was evaluated in the nursing home. Patient reports increased spasticity in her left arm and arm, with difficulty moving the joystick of her motorized chair. She has not been able to use her hand to use her phone.  She is concerned about this as she thought that her spasticity would not worsen. She also reports increased drooling, but no difficulty swallowing, no difficulty breathing. She has some pain in her left hand and arm as well. She also reports some soreness in her shoulders.   Her baclofen was increased on Wednesday from 20 mg tid to 20mg  qid. She also had tizanidine increased from 4mg  to 8mg  daily.    ROS: see HPI Past Medical History: reviewed and updated medications and allergies. Social History: Tobacco: none  Objective: BP: 132/79, HR: 80  Physical Examination:   General appearance - alert, oriented x4, in wheelchair Chest - clear to auscultation, no wheezes, rales or rhonchi, symmetric air entry Heart - normal rate, regular rhythm, normal S1, S2, no murmurs Abdomen - soft, mildly uncomfortable in suprapubic region but no rebound or guarding, suprapubic catheter insertion is clean with scant clear mucous discharge Extremities - no edema in lower extremities Left hand with very limited and slow purposeful movement. Is able to operate joystick after several seconds of adjustment.  Limited flexibility of fingers and increased tone in hand and arm.

## 2013-12-30 ENCOUNTER — Other Ambulatory Visit: Payer: Self-pay | Admitting: Family Medicine

## 2013-12-30 MED ORDER — TIZANIDINE HCL 4 MG PO CAPS: 4.0000 mg | ORAL_CAPSULE | Freq: Three times a day (TID) | ORAL | Status: DC

## 2013-12-30 MED ORDER — BACLOFEN 10 MG PO TABS: 20.0000 mg | ORAL_TABLET | Freq: Four times a day (QID) | ORAL | Status: DC

## 2014-01-05 ENCOUNTER — Ambulatory Visit (INDEPENDENT_AMBULATORY_CARE_PROVIDER_SITE_OTHER): Payer: Medicaid Other | Admitting: Neurology

## 2014-01-05 ENCOUNTER — Encounter: Payer: Self-pay | Admitting: Neurology

## 2014-01-05 DIAGNOSIS — R252 Cramp and spasm: Secondary | ICD-10-CM

## 2014-01-05 DIAGNOSIS — G808 Other cerebral palsy: Secondary | ICD-10-CM

## 2014-01-05 HISTORY — DX: Cramp and spasm: R25.2

## 2014-01-05 MED ORDER — ONABOTULINUMTOXINA 100 UNITS IJ SOLR
300.0000 [IU] | Freq: Once | INTRAMUSCULAR | Status: AC
  Administered 2014-01-05: 300 [IU] via INTRAMUSCULAR

## 2014-01-05 NOTE — Progress Notes (Signed)
HPI:   [name] is a 38 year old Female with spastic and dystonic quadriparesis, depression/ anxiety, episodes where she loses contact with her world and that have been studied thoroughly and appear nonepileptic in nature, migraine headaches.  She is currently taking coumadin for her previous DVT, recurrent pulmonary emboli.  She came in for EMG guided BOTOX A injection, previously was done by Dr. Kelli HopeMichael Reynolds since Feb, 2006, [facility].   BOTOX has always been to her right arm, where she often complains of spastic pain, her left arm even though has significant spasticity, is her functional arm.  She used her left arm to manipulate wheelchair, operating cellphone and feed herself.  She also has intrathecal baclofen pump. She used to be able to transfer herself in/out of wheelchair, living in NH now she has gradually worsening functional capacity over the past few month, especially since her recent hospital admission in August 2012. She complained of syncopy, there was associated hypoxia to 83%, CT angiogram of bran and neck were normal. EEG ws normal. abilify and klonopin dosage were decreased  She was discharged to nursing home now, she could no longer transfer herself in and out of wheelchair, she has less functional left arm.  UPDATE February 4th 2015: Last injection was in October 2014, she received injection to her right arm, she received 200 units injection total, 17 5 units   to her right arm, 25 units to her left flexor digitorum profundus   Today she complains of worsening left arm function, difficulty straight out fingers, once more injection to her left hand, which is her functional hand   Physical Examinations:  Mental Status: seated in wheelchair, slumped slightly, with head band and chest vest to hold her body posture.  Cranial Nerves: round minimally reactive pupils,  conjugate eye movement, dystonic but symmetric facial strength, difficulty  protruding her tongue, severe dysarthria to the point of being in unintelligible at times Motor: right arm in shoulder external rotation, pronation, elbow flexion, wrist and finger flexion, when extend, she has uncontrollable right shoulder posterior extension.  Left arm able to have antigravity movement, but slow movement under voluntary control, stayed in left elbow pronation, flexion. The left hand was fisted. She was able to move her legs against gravity.  Sensory: Withdrawal to pain Coordination:  poor coordination Gait and Station: spastic diplegia, in wheelchair   Assessment and Plan:  38 yo Caucasian female with spastic quadriplegia, under EMG guidance,  300 units of BOTOX A were injected,  (Lot No, [id number], Exp August 2017, 100 units/2cc)   Right biceps 25  Right flexor carpi ulnaris 25 Right flexor carpi radialis 25 Right flexor digitorum profundus 25    Right Rhomboid 25 units x2= 50  Right supraspinatus 25  right infraspinatus 25   Left flexor digitorum profundus 25 units  Left flexor carpi ulnaris 25  Let flexor digitorum superficialis 25 Left hand lumbrical divided into 4 injection 25 units  She tolerates the injection well, will return in 3 months for repeat injections.

## 2014-01-07 ENCOUNTER — Non-Acute Institutional Stay (INDEPENDENT_AMBULATORY_CARE_PROVIDER_SITE_OTHER): Payer: Medicaid Other | Admitting: Family Medicine

## 2014-01-07 DIAGNOSIS — R259 Unspecified abnormal involuntary movements: Secondary | ICD-10-CM

## 2014-01-07 DIAGNOSIS — R252 Cramp and spasm: Secondary | ICD-10-CM

## 2014-01-07 NOTE — Assessment & Plan Note (Signed)
Seen by Dr. Terrace ArabiaYan for botox injections on 01/05/14.  Seemed to have had some relief with increase of xanaflex to 4mg  tid but had some drowsiness with it. Trial of 4mg  qam and 8mg  qhs has not been as successful due to wearing off in the middle of the day.  Patient would like to try again xanaflex 4mg  tid. Wrote order in chart Continue baclofen

## 2014-01-07 NOTE — Progress Notes (Signed)
Patient ID: Mindi SlickerJudith Spellman    DOB: September 30, 1976, 38 y.o.   MRN: [id number] --- Subjective:  [name] is a 38 y.o.female, Nursing Home patient at [facility] with h/o spastic CP who was evaluated in the nursing home. She has recently been struggling with increased spasticity in her left hand, which is the hand she uses to operate her motorized chair.  She had recently been started on tizanidine 4mg  tid which helped with the spasticity but made her drowsy in the middle of the day. It was then switched to 4mg  in am and 8mg  at night, but patient noticed that it would wear off in the middle of the day and have increased spasticity making it difficult to function. She has been fitted for a night time brace which has helped. She also continues to work with occupational therapy.  She was seen on 01/05/14 for botox injections.  ROS: see HPI Past Medical History: reviewed and updated medications and allergies. Social History: Tobacco: none  Objective: Weight: 120.8lbs  Physical Examination:   General appearance - alert, oriented x4, in wheelchair Chest - clear to auscultation, no wheezes, rales or rhonchi, symmetric air entry Heart - normal rate, regular rhythm, normal S1, S2, no murmurs Abdomen - soft, non tender, non distended Extremities - no edema in lower extremities Left hand with spasticity but able to open hand slowly, mobile elbow and shoulder

## 2014-01-19 ENCOUNTER — Encounter (HOSPITAL_COMMUNITY): Payer: Self-pay | Admitting: Pharmacist

## 2014-01-21 ENCOUNTER — Non-Acute Institutional Stay: Payer: Medicaid Other | Admitting: Family Medicine

## 2014-01-21 ENCOUNTER — Encounter: Payer: Self-pay | Admitting: Family Medicine

## 2014-01-21 DIAGNOSIS — R471 Dysarthria and anarthria: Secondary | ICD-10-CM

## 2014-01-21 DIAGNOSIS — G809 Cerebral palsy, unspecified: Secondary | ICD-10-CM

## 2014-01-21 DIAGNOSIS — G801 Spastic diplegic cerebral palsy: Secondary | ICD-10-CM

## 2014-01-21 DIAGNOSIS — R109 Unspecified abdominal pain: Secondary | ICD-10-CM

## 2014-01-21 DIAGNOSIS — N319 Neuromuscular dysfunction of bladder, unspecified: Secondary | ICD-10-CM

## 2014-01-21 NOTE — Progress Notes (Signed)
   Subjective:    Patient ID: [name] SlickerJudith Okeefe, female    DOB: 1976/11/08, 38 y.o.   MRN: [id number]  HPI 38 year old female with past medical history of cerebral palsy, severe spasticity, neurogenic bladder with suprapubic catheter, history of recurrent pulmonary emboli, and chronic pain syndrome who is seen and evaluated at [facility] on 01/21/2014.  Abdominal pain-patient reports 3 to 4 day history of generalized abdominal pain, patient was evaluated by the resident physician on 01/20/2014, patient underwent a abdominal x-ray which identified evidence of ileus, patient was treated with saline enema which resulted in minimal relief of stool, patient reports that her abdominal pain persists today, no associated nausea or vomiting, pain is primarily in bilateral lower quadrants, patient denies vaginal discharge, no significant drainage from suprapubic catheter  Cerebral palsy with spasticity - patient underwent Botox injection on 01/15/2014 of right arm and back as well as the left wrist and hand, patient reports improvement of her symptoms, she is currently on baclofen and Zanaflex daily as well  Neurogenic bladder with indwelling suprapubic catheter-catheter was changed earlier in the month, minimal drainage from the catheter site  History of pulmonary emboli-patient currently on anticoagulation with Xarelto, patient denies recent shortness of breath  Social-nonsmoker  Surgical history-patient underwent laparoscopic hysterectomy on 10/27/2012   Review of Systems  Constitutional: Negative for fever, chills and fatigue.  Respiratory: Negative for cough, choking and shortness of breath.   Gastrointestinal: Positive for abdominal pain and constipation. Negative for nausea, vomiting, diarrhea, blood in stool and abdominal distention.  Genitourinary: Negative for dysuria.  Musculoskeletal: Positive for arthralgias.       Objective:   Physical Exam Vitals: Reviewed, full set of  vitals pending General: Pleasant Caucasian female, sitting in a motorized wheelchair HEENT: Normocephalic, pupils equal round and reactive to light, extraocular motion intact, moist mucous membranes Cardiac: Regular rate and rhythm, S1 and S2 present, no murmurs, no heaves or thrills Respiratory: Clear to auscultation bilaterally, normal effort Abdomen: Soft, mild tenderness in bilateral lower quadrants, suprapubic catheter in place with small amount of drainage, no surrounding erythema or evidence of cellulitis, bowel sounds present MSK: Patient in wheelchair, significant spasticity and contracturing of bilateral upper Extremities: No edema  Abdominal x-ray performed on 01/21/2014 showed evidence of colonic ileus    Assessment & Plan:  Please see problem specific assessment and plan.

## 2014-01-21 NOTE — Assessment & Plan Note (Signed)
Spasticity is improved status post Botox injections on 01/15/2014. -Continue treatment with baclofen and tizanidine

## 2014-01-21 NOTE — Assessment & Plan Note (Signed)
She was stable with indwelling suprapubic catheter

## 2014-01-21 NOTE — Assessment & Plan Note (Signed)
Patient reports 3-4 day history of crampy abdominal pain. Reviewed significant past medical history as outlined in outline section for this problem. Patient is status post hysterectomy in 2013 which suggests against uterine etiology of abdominal pain. Abdominal films identified colonic ileus which is the likely etiology of her current abdominal pain. No acute sides symptoms to suggest urinary infection. -Repeat saline enema -Treat with senna and Colace -Reevaluate abdominal pain in 1-2 days -May consider urinalysis if symptoms persist

## 2014-01-21 NOTE — Assessment & Plan Note (Signed)
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

## 2014-01-25 ENCOUNTER — Telehealth: Payer: Self-pay | Admitting: Sports Medicine

## 2014-01-25 MED ORDER — OXYBUTYNIN CHLORIDE 5 MG PO TABS: 5.0000 mg | ORAL_TABLET | Freq: Two times a day (BID) | ORAL | Status: DC

## 2014-01-25 MED ORDER — SENNA 8.6 MG PO TABS: 1.0000 | ORAL_TABLET | ORAL | Status: DC

## 2014-01-25 NOTE — Telephone Encounter (Signed)
[facility] Patient [name] SlickerJudith [name] 38 y.o. female  MRN: [id number]  DOB: 04/12/1976 PCP: [name] SkinnerStephanie E Losq, MD      Situation:  Called to see pt due to continued lower abdominal cramping pain     Subjective:  Patient reports normal bowel movements now after changing her bowel regimen. Cramping worse at night.     History:  Suprapubic catheter changed at the beginning of the month. Due for recurrent intravesicular Botox injections on March 9. Not on any anti-spasmodics for bladder. Prior UTI with Escherichia coli and Proteus that are highly sensitive     Vitals:  Afebrile     Physical Exam: Continued suprapubic tenderness. Urine significantly more cloudy than last week with increased sediment    Assessment & Plan: Change suprapubic catheter.  Given pain and change in urine character Will check urinalysis and culture.   If urine is positive for infection will start by mouth antibiotics - Start oxybutynin for symptomatic relief. > Consider reevaluation by urology for consideration of more frequent intravesicular Botox.

## 2014-01-26 ENCOUNTER — Telehealth: Payer: Self-pay | Admitting: Family Medicine

## 2014-01-26 MED ORDER — HYDROCODONE-ACETAMINOPHEN 5-325 MG PO TABS: 1.0000 | ORAL_TABLET | Freq: Four times a day (QID) | ORAL | Status: DC | PRN

## 2014-01-26 NOTE — Telephone Encounter (Signed)
Dr Berline Choughigby called [name] [name] again. Ramatoulaye Pack, Virgel BouquetGiovanna S

## 2014-01-26 NOTE — Telephone Encounter (Signed)
Update: Patient continuing to have pain.  Urinalysis is indicative of a urinary tract infection and she will be started on 1 g of Rocephin tonight.  Pain continues to be persistent and she is requesting a higher pain medication.  Increase Vicodin to 1-2 tablets by mouth every 6 hours when necessary pain.  Patient may request transfer to the emergency department later if that is what she wishes but at this time believe that treating her likely infection and controlling her pain is appropriate.    I will followup with her tomorrow during the day.  Andrena MewsMichael D Milagros Middendorf, DO [facility] Resident - PGY-3 01/26/2014 5:15 PM

## 2014-01-26 NOTE — Telephone Encounter (Signed)
Would like to speak with Dr Berline Choughrigby again [phone]

## 2014-01-26 NOTE — Telephone Encounter (Signed)
Refilled med [facility]   Refilled vicodin changed sig to 1-2 q 6 prn while patient has UTI.

## 2014-02-01 ENCOUNTER — Encounter: Payer: Self-pay | Admitting: Sports Medicine

## 2014-02-01 LAB — URINALYSIS, ROUTINE W REFLEX MICROSCOPIC
Colony Count: NO GROWTH
Nitrite: NEGATIVE

## 2014-02-01 LAB — URINE CULTURE: PROTEIN: 100

## 2014-02-03 ENCOUNTER — Other Ambulatory Visit: Payer: Self-pay | Admitting: Family Medicine

## 2014-02-03 ENCOUNTER — Other Ambulatory Visit: Payer: Self-pay | Admitting: Sports Medicine

## 2014-02-03 MED ORDER — CLONAZEPAM 1 MG PO TABS: 1.0000 mg | ORAL_TABLET | Freq: Four times a day (QID) | ORAL | Status: DC

## 2014-02-03 NOTE — Telephone Encounter (Signed)
Refilled med in response to [facility]   Refilled clonazepam #120 with 2 refills.

## 2014-02-15 ENCOUNTER — Encounter: Payer: Self-pay | Admitting: Sports Medicine

## 2014-02-24 ENCOUNTER — Ambulatory Visit (INDEPENDENT_AMBULATORY_CARE_PROVIDER_SITE_OTHER): Payer: Medicaid Other | Admitting: Pediatrics

## 2014-02-24 ENCOUNTER — Encounter: Payer: Self-pay | Admitting: Pediatrics

## 2014-02-24 DIAGNOSIS — G808 Other cerebral palsy: Secondary | ICD-10-CM

## 2014-02-24 NOTE — Progress Notes (Signed)
Patient: [name] [name] MRN: [id number] Sex: female DOB: 05-13-1976  Provider: Deetta Perla, MD Location of Care: [facility]  Note type: Routine return visit  History of Present Illness: Referral Source: Dr. Harvel Quale McGill History from: patient and [facility] chart Chief Complaint: Baclofen Pump Refill  [name] [name] is a 38 y.o. female who returns to empty, reprogram, and refill her intrathecal baclofen pump.  Procedure: emptying, refilling, and reprogramming her intrathecal baclofen pump.   The device was interrogated and showed a complex continuous infusion of baclofen that has a basal rate of 13.5 mcg per hour. The patient receives bolus doses of 25 mcg delivered at midnight, 6 AM, 12 noon, and 6 PM, for total of 428.0 mcg per day.   The patient was sterilely prepped and draped. A 1-1/2 inch 22-gauge noncoring Huebner needle was inserted on the first pass. 5.0 mL was withdrawn from the pump placing it under partial vacuum. 40 mL of baclofen (concentration 2000 mcg/mL) was instilled into the pump through a Millipore filter. The patient tolerated the procedure well.   She has reached the end of the battery life of her pump.  It will need to be replaced soon.  Review of Systems: 12 system review was remarkable for back pain  Past Medical History  Diagnosis Date  . Cerebral palsy   . Pulmonary embolism     Lifetime Coumadin  . Cerebral palsy     Spastic Cerebral palsy, mentally intact  . Contracture, joint, multiple sites     Electric wheelchair, uses left hand to operate chair.   [name] Kitchen Hypertension   . OCD (obsessive compulsive disorder)   . Depression   . Migraine   . [name]   . Dysarthria   . Endometriosis   . History of recurrent UTIs   . GERD (gastroesophageal reflux disease)   . Pulmonary embolism 2011, 01/2011    Will be on lifetime coumadin  . Anemia   . Abdominal pain 01/02/2012    Overview:  Overview:  Per Previous pcp  note- Dr. Ta--Pt has history of endometriosis and had DUB in 2011.  She She had u/s that showed normal endometrial stripe.  She had endometrial bx that was wnl.  She has a lot of abd cramping every month.  This cramping was sometimes not relieved with hydrocodone.  She was referred to [facility] for IUD placement to help endometriosis and abd cramping.  [name]  . Macroscopic hematuria 03/10/2013    status post replacement of suprapubic tube 03/04/2013   . HYPERTENSION, BENIGN 01/31/2010    Qualifier: Diagnosis of  By: [name], MD, [name]    Hospitalizations: no, Head Injury: no, Nervous System Infections: no, Immunizations up to date: yes Past Medical History Comments: Patient has a history of significant depression anxiety, and nonepileptic seizures, chronic low back pain, asthma, insomnia, migraine headaches, and deep vein thrombosis with pulmonary embolism..  Birth History Extremely premature infant with periventricular leukomalacia.  Behavior History none  Surgical History Past Surgical History  Procedure Laterality Date  . Colposcopy  06/2000  . Cesarean section      x 2  . Tubal ligation  2003  . Carpal tunnel release  08/2008    Dr Teressa Senter  . Wrist surgery  06/2010    Dr Dierdre Searles, hand surgeon, [name]  . Baclofen pump refill      x 3 times  . Appendectomy    . Intrauterine device insertion  04/30/11    [facility]  by [facility] for [facility]osis  . Laparoscopically assisted vag hysterectomy  10/27/2012  . Multiple extractions with alveoloplasty Bilateral 07/19/2013    Procedure: EXTRACTIONS #4, 1,61,09,605,11,16,19;  Surgeon: Francene Findershristopher L Avilla, DDS;  [facility];  Service: Oral Surgery;  Laterality: Bilateral;   Family History family history includes Asthma in her father; Cancer in her maternal grandmother and paternal grandmother. Family History is negative migraines, seizures, cognitive impairment, blindness, deafness, birth defects, chromosomal disorder, autism.  Social  History History   Social History  . Marital Status: Divorced    Spouse Name: N/A    Number of Children: 2  . Years of Education: college   Occupational History  .    [name] Kitchen. UNEMPLOYED    Social History Main Topics  . Smoking status: Never Smoker   . Smokeless tobacco: Never Used  . Alcohol Use: 0.6 oz/week    1 Glasses of wine per week     Comment: Drinks alcohol once a month.  . Drug Use: No  . Sexual Activity: No   Other Topics Concern  . None   Social History Narrative   Ailene ArdsFrank Haith, who has schizophrenia, MR, is father of daughter.  Homero FellersFrank and pt are no longer together.  Homero FellersFrank does not see Norberta KeensKiarra. Daughter is 378 yo, Stage managerKiarra Haith, lives with pt.        Maisie Fushomas [phone]), lives with pt's parents.      Needs assistance with ADL's and IADL's--has personal care services 20 hrs/wk;       No tobacco, EtOH, drugs.      **Patient very concerned about her kids being taken from her.      **Case Manager: Jack Quartosabelle Culler [phone]      [facility]   Educational level junior college Living with [facility]  Hobbies/Interest: Enjoys reading and being on face book when she's able. School comments [name] graduated from [employer] in 2004 with a degree in Surveyor, mineralsCriminal Justice.   Current Outpatient Prescriptions on File Prior to Visit  Medication Sig Dispense Refill  . acetaminophen (TYLENOL) 325 MG tablet Take 2 tablets (650 mg total) by mouth every 8 (eight) hours.  90 tablet  0  . ARIPiprazole (ABILIFY) 5 MG tablet Take 5 mg by mouth every morning.       . bacitracin-polymyxin b (POLYSPORIN) ointment Apply topically 2 (two) times daily as needed (at cath site for redness, discharge).      . baclofen (LIORESAL) 10 MG tablet Take 2 tablets (20 mg total) by mouth 4 (four) times daily.  30 each  0  . clonazePAM (KLONOPIN) 1 MG tablet Take 1 tablet (1 mg total) by mouth 4 (four) times daily.  120 tablet  2  . HYDROcodone-acetaminophen (NORCO/VICODIN)  5-325 MG per tablet Take 1-2 tablets by mouth every 6 (six) hours as needed for severe pain.  30 tablet  0  . ibuprofen (ADVIL,MOTRIN) 200 MG tablet Take 400 mg by mouth every 8 (eight) hours as needed for pain.      [name] Kitchen. lamoTRIgine (LAMICTAL) 200 MG tablet Take 200 mg by mouth daily.       [name] Kitchen. lidocaine (LIDODERM) 5 % Place 1 patch onto the skin daily. Remove & Discard patch within 12 hours or as directed by MD      . loratadine (CLARITIN) 10 MG tablet Take 10 mg by mouth daily as needed for allergies.      . Multiple Vitamin (MULTIVITAMIN WITH MINERALS) TABS Take 1  tablet by mouth every evening.       [name] Kitchen QUEtiapine (SEROQUEL) 100 MG tablet Take 100 mg by mouth at bedtime.      . Rivaroxaban (XARELTO) 20 MG TABS Take 1 tablet (20 mg total) by mouth daily with supper.  30 tablet    . senna (SENOKOT) 8.6 MG TABS tablet Take 1 tablet (8.6 mg total) by mouth 3 (three) times a week. Monday Wednesday Friday  120 each  0  . sertraline (ZOLOFT) 100 MG tablet Take 200 mg by mouth at bedtime.        . SUMAtriptan (IMITREX) 100 MG tablet Take 100 mg by mouth every 2 (two) hours as needed for migraine.       [name] Kitchen tiZANidine (ZANAFLEX) 4 MG capsule Take 1 capsule (4 mg total) by mouth 3 (three) times daily.  30 capsule  0  . topiramate (TOPAMAX) 100 MG tablet Take 100 mg by mouth at bedtime.        No current facility-administered medications on file prior to visit.   The medication list was reviewed and reconciled. All changes or newly prescribed medications were explained.  A complete medication list was provided to the patient/caregiver.  Allergies  Allergen Reactions  . Morphine Dermatitis    Skin turned red    Physical Exam LMP 04/05/2011  The patient has quadriparesis with spasticity and rigidity.  She has preservation of her left arm in a triplegia.  The right arm is in a brace with the hand dorsiflexed.  Cannot be brought to neutral position there is limited movement of the fingers.  Left hand can  open and close and fine motor movements are clumsy.  Repetitive flexion-extension of her arms and legs lessens her tone.  She has significant dysarthria but is intelligible.  She makes good eye contact.  She has symmetric facial strength full visual fields.  She is awake and alert today.  Assessment  Spastic and dystonic quadriparesis, from extreme prematurity, 343.2, 333.7.  Plan The patient had a successful procedure to empty refill and reprogram her pump.  We need to contact her to determine where the pump is going to be replaced.  I prefer to do it in [place].  She is going to need to be off Xarelto for a few days before the pump is changed.  I will contact the patient on Monday and also Dr. Odette Fraction to find out about his availability.  Deetta Perla MD

## 2014-02-26 ENCOUNTER — Encounter: Payer: Self-pay | Admitting: Pediatrics

## 2014-02-28 ENCOUNTER — Encounter: Payer: Self-pay | Admitting: Pharmacist

## 2014-03-01 ENCOUNTER — Telehealth: Payer: Self-pay

## 2014-03-01 NOTE — Telephone Encounter (Signed)
I called Dad and explained that she is not having surgery tomorrow. TG

## 2014-03-01 NOTE — Telephone Encounter (Signed)
[name], dad, lvm stating that [name] told him that she was going to get her pump replaced tomorrow. He is concerned bc she is scheduled to have [facility]. [name] is requesting a call back today at [phone]

## 2014-03-02 ENCOUNTER — Other Ambulatory Visit: Payer: Self-pay | Admitting: Neurosurgery

## 2014-03-03 ENCOUNTER — Encounter (HOSPITAL_COMMUNITY): Payer: Self-pay | Admitting: Pharmacy Technician

## 2014-03-04 ENCOUNTER — Other Ambulatory Visit (HOSPITAL_COMMUNITY): Payer: Self-pay | Admitting: *Deleted

## 2014-03-04 ENCOUNTER — Inpatient Hospital Stay (HOSPITAL_COMMUNITY)
Admission: RE | Admit: 2014-03-04 | Discharge: 2014-03-04 | Disposition: A | Discharge status: Home / Self Care | Payer: Medicaid Other | Source: Ambulatory Visit

## 2014-03-04 NOTE — Progress Notes (Signed)
Pt was a no-show for PAT. Called [facility] where she lives and they stated that she was at another appt. I then spoke with Deloris Pingye, her nurse at [facility] and verified allergies, medications and medical hx. Could not verify surgical hx. Tye states pt's father will be coming with pt on Monday. I also spoke with him Rudell Cobb([name] Leanos) and gave him time of arrival for pt on Monday. He asked about lab work to be done and I explained to him that we would draw the blood day of surgery. He states that she might have labs done thru [facility] and asked if I could check. I did thru [facility] and she has not had any recent labs done there that I could see.  Faxing [facility] instructions to [facility] per request of Tye, nurse for pt. Also spoke with Gavin Poundeborah, Diplomatic Services operational officersecretary at [facility] and gave her time of arrival so she could arrange transportation.

## 2014-03-04 NOTE — Progress Notes (Signed)
Pt is on Xarelto, nursing facility does not have any instructions on when to stop this medication prior to surgery. Called Dr. Lovell SheehanJenkins' office (they are closed for the holiday), answering service will page physician on call.

## 2014-03-04 NOTE — Pre-Procedure Instructions (Signed)
[name] SlickerJudith [name]  03/04/2014   Your procedure is scheduled on:  Monday, March 07, 2014 at 9:57 AM.   Report to [facility] "A" Admitting Office at 7:00 AM.   Call this number if you have problems the morning of surgery: [phone]   Remember:   Do not eat food or drink liquids after midnight Sunday, 03/06/14.   Take these medicines the morning of surgery with A SIP OF WATER: acetaminophen (TYLENOL), ARIPiprazole (ABILIFY), baclofen (LIORESAL),  clonazePAM (KLONOPIN), lamoTRIgine (LAMICTAL), tiZANidine (ZANAFLEX), Claritin - if needed.  Stop Ibuprofen and Vitamins as of today. Follow physician's instructions about Xarelto.    Do not wear jewelry, make-up or nail polish.  Do not wear lotions, powders, or perfumes. You may wear deodorant.  Do not shave 48 hours prior to surgery.   Do not bring valuables to the hospital.  [facility] is not responsible                  for any belongings or valuables.               Contacts, dentures or bridgework may not be worn into surgery.  Leave suitcase in the car. After surgery it may be brought to your room.  For patients admitted to the hospital, discharge time is determined by your                treatment team.          Special Instructions: [facility] - Preparing for Surgery  Before surgery, you can play an important role.  Because skin is not sterile, your skin needs to be as free of germs as possible.  You can reduce the number of germs on you skin by washing with CHG (chlorahexidine gluconate) soap before surgery.  CHG is an antiseptic cleaner which kills germs and bonds with the skin to continue killing germs even after washing.  Please DO NOT use if you have an allergy to CHG or antibacterial soaps.  If your skin becomes reddened/irritated stop using the CHG and inform your nurse when you arrive at Short Stay.  Do not shave (including legs and underarms) for at least 48 hours prior to the first CHG shower.  You may shave  your face.  Please follow these instructions carefully:   1.  Shower with CHG Soap the night before surgery and the                                morning of Surgery.  2.  If you choose to wash your hair, wash your hair first as usual with your       normal shampoo.  3.  After you shampoo, rinse your hair and body thoroughly to remove the                      Shampoo.  4.  Use CHG as you would any other liquid soap.  You can apply chg directly       to the skin and wash gently with scrungie or a clean washcloth.  5.  Apply the CHG Soap to your body ONLY FROM THE NECK DOWN.        Do not use on open wounds or open sores.  Avoid contact with your eyes, ears, mouth and genitals (private parts).  Wash genitals (private parts) with your normal soap.  6.  Wash thoroughly,  paying special attention to the area where your surgery        will be performed.  7.  Thoroughly rinse your body with warm water from the neck down.  8.  DO NOT shower/wash with your normal soap after using and rinsing off       the CHG Soap.  9.  Pat yourself dry with a clean towel.            10.  Wear clean pajamas.            11.  Place clean sheets on your bed the night of your first shower and do not        sleep with pets.  Day of Surgery  Do not apply any lotions the morning of surgery.  Please wear clean clothes to the hospital/surgery center.     Please read over the following fact sheets that you were given: Pain Booklet, Coughing and Deep Breathing, MRSA Information and Surgical Site Infection Prevention

## 2014-03-04 NOTE — Progress Notes (Signed)
Dr. Mikal Planeabell returned page and states pt needs to stop Xarelto today. States it might not be long enough, but Dr. Lovell SheehanJenkins can make that determination day of surgery. I have notified [facility] (Tye, nurse) about this.

## 2014-03-05 ENCOUNTER — Telehealth: Payer: Self-pay | Admitting: Family Medicine

## 2014-03-05 NOTE — Telephone Encounter (Signed)
Received call from nurse stating her "CNS" had three bacteria growing in it and recently started on [facility] at [facility] three days ago.  Upon further questioning, nurse really meant UA-CNS? Or urinalysis growing 20,000 of different bacteria and pt is not having Sx.  Most likely colonized and going to have procedure on her bladder at [facility] in two days, so will not add any more ABx coverage at this time.  Twana FirstBryan R. Rori Goar, DO of [facility] 03/05/2014, 11:06 AM

## 2014-03-06 MED ORDER — CEFAZOLIN SODIUM-DEXTROSE 2-3 GM-% IV SOLR
2.0000 g | INTRAVENOUS | Status: AC
  Administered 2014-03-07: 2 g via INTRAVENOUS
  Filled 2014-03-06: qty 50

## 2014-03-07 ENCOUNTER — Other Ambulatory Visit: Payer: Self-pay | Admitting: Family Medicine

## 2014-03-07 ENCOUNTER — Encounter (HOSPITAL_COMMUNITY): Payer: Medicaid Other | Admitting: Anesthesiology

## 2014-03-07 ENCOUNTER — Encounter (HOSPITAL_COMMUNITY): Payer: Self-pay | Admitting: *Deleted

## 2014-03-07 ENCOUNTER — Encounter (HOSPITAL_COMMUNITY)
Admission: RE | Disposition: A | Discharge status: Skilled Nursing Facility | Payer: Self-pay | Source: Ambulatory Visit | Attending: Neurosurgery

## 2014-03-07 ENCOUNTER — Observation Stay (HOSPITAL_COMMUNITY)
Admission: RE | Admit: 2014-03-07 | Discharge: 2014-03-08 | Disposition: A | Discharge status: Skilled Nursing Facility | Payer: Medicaid Other | Source: Ambulatory Visit | Attending: Neurosurgery | Admitting: Neurosurgery

## 2014-03-07 ENCOUNTER — Ambulatory Visit (HOSPITAL_COMMUNITY): Payer: Medicaid Other | Admitting: Anesthesiology

## 2014-03-07 DIAGNOSIS — I10 Essential (primary) hypertension: Secondary | ICD-10-CM | POA: Insufficient documentation

## 2014-03-07 DIAGNOSIS — Y831 Surgical operation with implant of artificial internal device as the cause of abnormal reaction of the patient, or of later complication, without mention of misadventure at the time of the procedure: Secondary | ICD-10-CM | POA: Insufficient documentation

## 2014-03-07 DIAGNOSIS — K219 Gastro-esophageal reflux disease without esophagitis: Secondary | ICD-10-CM | POA: Insufficient documentation

## 2014-03-07 DIAGNOSIS — M1990 Unspecified osteoarthritis, unspecified site: Secondary | ICD-10-CM | POA: Insufficient documentation

## 2014-03-07 DIAGNOSIS — Z7901 Long term (current) use of anticoagulants: Secondary | ICD-10-CM | POA: Insufficient documentation

## 2014-03-07 DIAGNOSIS — Z978 Presence of other specified devices: Secondary | ICD-10-CM

## 2014-03-07 DIAGNOSIS — F3289 Other specified depressive episodes: Secondary | ICD-10-CM | POA: Insufficient documentation

## 2014-03-07 DIAGNOSIS — N809 Endometriosis, unspecified: Secondary | ICD-10-CM | POA: Insufficient documentation

## 2014-03-07 DIAGNOSIS — G809 Cerebral palsy, unspecified: Secondary | ICD-10-CM | POA: Insufficient documentation

## 2014-03-07 DIAGNOSIS — G43909 Migraine, unspecified, not intractable, without status migrainosus: Secondary | ICD-10-CM | POA: Insufficient documentation

## 2014-03-07 DIAGNOSIS — D649 Anemia, unspecified: Secondary | ICD-10-CM | POA: Insufficient documentation

## 2014-03-07 DIAGNOSIS — Z86711 Personal history of pulmonary embolism: Secondary | ICD-10-CM | POA: Insufficient documentation

## 2014-03-07 DIAGNOSIS — T85695A Other mechanical complication of other nervous system device, implant or graft, initial encounter: Principal | ICD-10-CM | POA: Insufficient documentation

## 2014-03-07 DIAGNOSIS — F429 Obsessive-compulsive disorder, unspecified: Secondary | ICD-10-CM | POA: Insufficient documentation

## 2014-03-07 DIAGNOSIS — F329 Major depressive disorder, single episode, unspecified: Secondary | ICD-10-CM | POA: Insufficient documentation

## 2014-03-07 HISTORY — DX: Presence of other specified devices: Z97.8

## 2014-03-07 HISTORY — PX: PAIN PUMP IMPLANTATION: SHX330

## 2014-03-07 LAB — CBC
HEMATOCRIT: 32.8 % — AB (ref 36.0–46.0)
Hemoglobin: 10.4 g/dL — ABNORMAL LOW (ref 12.0–15.0)
MCH: 25.5 pg — ABNORMAL LOW (ref 26.0–34.0)
MCHC: 31.7 g/dL (ref 30.0–36.0)
MCV: 80.4 fL (ref 78.0–100.0)
Platelets: 335 10*3/uL (ref 150–400)
RBC: 4.08 MIL/uL (ref 3.87–5.11)
RDW: 17.3 % — AB (ref 11.5–15.5)
WBC: 7.5 10*3/uL (ref 4.0–10.5)

## 2014-03-07 LAB — APTT: APTT: 27 s (ref 24–37)

## 2014-03-07 LAB — BASIC METABOLIC PANEL
BUN: 12 mg/dL (ref 6–23)
CO2: 22 mEq/L (ref 19–32)
CREATININE: 0.49 mg/dL — AB (ref 0.50–1.10)
Calcium: 8.6 mg/dL (ref 8.4–10.5)
Chloride: 105 mEq/L (ref 96–112)
GFR calc Af Amer: >90 mL/min (ref >90)
Glucose, Bld: 96 mg/dL (ref 70–99)
Potassium: 4 mEq/L (ref 3.7–5.3)
Sodium: 142 mEq/L (ref 137–147)

## 2014-03-07 LAB — MRSA PCR SCREENING: MRSA by PCR: NEGATIVE

## 2014-03-07 LAB — SURGICAL PCR SCREEN
MRSA, PCR: NEGATIVE
Staphylococcus aureus: NEGATIVE

## 2014-03-07 SURGERY — PAIN PUMP INSERTION
Anesthesia: General

## 2014-03-07 MED ORDER — MIDAZOLAM HCL 5 MG/5ML IJ SOLN
INTRAMUSCULAR | Status: DC | PRN
  Administered 2014-03-07 (×2): 1 mg via INTRAVENOUS

## 2014-03-07 MED ORDER — LACTATED RINGERS IV SOLN
INTRAVENOUS | Status: DC
  Administered 2014-03-07: 17:00:00 via INTRAVENOUS

## 2014-03-07 MED ORDER — HYDROCODONE-ACETAMINOPHEN 5-325 MG PO TABS
1.0000 | ORAL_TABLET | ORAL | Status: DC | PRN
Start: 2014-03-07 — End: 2014-03-08
  Administered 2014-03-07: 2 via ORAL
  Filled 2014-03-07: qty 2

## 2014-03-07 MED ORDER — ACETAMINOPHEN 325 MG PO TABS
650.0000 mg | ORAL_TABLET | Freq: Three times a day (TID) | ORAL | Status: DC
  Administered 2014-03-07 – 2014-03-08 (×2): 650 mg via ORAL
  Filled 2014-03-07 (×2): qty 2

## 2014-03-07 MED ORDER — PROPOFOL 10 MG/ML IV BOLUS
INTRAVENOUS | Status: DC | PRN
  Administered 2014-03-07: 160 mg via INTRAVENOUS

## 2014-03-07 MED ORDER — MENTHOL 3 MG MT LOZG: 1.0000 | LOZENGE | OROMUCOSAL | Status: DC | PRN

## 2014-03-07 MED ORDER — ADULT MULTIVITAMIN W/MINERALS CH
1.0000 | ORAL_TABLET | Freq: Every evening | ORAL | Status: DC
  Administered 2014-03-07: 1 via ORAL
  Filled 2014-03-07 (×2): qty 1

## 2014-03-07 MED ORDER — ROCURONIUM BROMIDE 100 MG/10ML IV SOLN
INTRAVENOUS | Status: DC | PRN
  Administered 2014-03-07: 20 mg via INTRAVENOUS

## 2014-03-07 MED ORDER — 0.9 % SODIUM CHLORIDE (POUR BTL) OPTIME
TOPICAL | Status: DC | PRN
  Administered 2014-03-07: 1000 mL

## 2014-03-07 MED ORDER — SODIUM CHLORIDE 0.9 % IR SOLN
Status: DC | PRN
  Administered 2014-03-07: 13:00:00

## 2014-03-07 MED ORDER — FENTANYL CITRATE 0.05 MG/ML IJ SOLN
INTRAMUSCULAR | Status: DC | PRN
  Administered 2014-03-07 (×4): 50 ug via INTRAVENOUS

## 2014-03-07 MED ORDER — ACETAMINOPHEN 325 MG PO TABS: 650.0000 mg | ORAL_TABLET | ORAL | Status: DC | PRN

## 2014-03-07 MED ORDER — CEFAZOLIN SODIUM-DEXTROSE 2-3 GM-% IV SOLR
2.0000 g | Freq: Three times a day (TID) | INTRAVENOUS | Status: AC
  Administered 2014-03-07 – 2014-03-08 (×2): 2 g via INTRAVENOUS
  Filled 2014-03-07 (×2): qty 50

## 2014-03-07 MED ORDER — EPHEDRINE SULFATE 50 MG/ML IJ SOLN
INTRAMUSCULAR | Status: DC | PRN
  Administered 2014-03-07 (×2): 5 mg via INTRAVENOUS

## 2014-03-07 MED ORDER — GLYCOPYRROLATE 0.2 MG/ML IJ SOLN
INTRAMUSCULAR | Status: AC
  Filled 2014-03-07: qty 2

## 2014-03-07 MED ORDER — DOCUSATE SODIUM 100 MG PO CAPS
100.0000 mg | ORAL_CAPSULE | Freq: Two times a day (BID) | ORAL | Status: DC
  Administered 2014-03-07 – 2014-03-08 (×2): 100 mg via ORAL
  Filled 2014-03-07 (×2): qty 1

## 2014-03-07 MED ORDER — IBUPROFEN 400 MG PO TABS
400.0000 mg | ORAL_TABLET | Freq: Three times a day (TID) | ORAL | Status: DC | PRN
  Filled 2014-03-07: qty 1

## 2014-03-07 MED ORDER — BACLOFEN 40 MG/20ML IT SOLN
INTRATHECAL | Status: DC | PRN
Start: 2014-03-07 — End: 2014-03-07
  Administered 2014-03-07 (×2): 40 mg via INTRATHECAL

## 2014-03-07 MED ORDER — LIDOCAINE HCL (CARDIAC) 20 MG/ML IV SOLN
INTRAVENOUS | Status: AC
  Filled 2014-03-07: qty 5

## 2014-03-07 MED ORDER — FENTANYL CITRATE 0.05 MG/ML IJ SOLN
25.0000 ug | INTRAMUSCULAR | Status: DC | PRN
  Administered 2014-03-07 (×4): 25 ug via INTRAVENOUS

## 2014-03-07 MED ORDER — LORATADINE 10 MG PO TABS
10.0000 mg | ORAL_TABLET | Freq: Every day | ORAL | Status: DC | PRN
  Filled 2014-03-07: qty 1

## 2014-03-07 MED ORDER — CLONAZEPAM 1 MG PO TABS
1.0000 mg | ORAL_TABLET | Freq: Four times a day (QID) | ORAL | Status: DC
  Administered 2014-03-07 – 2014-03-08 (×3): 1 mg via ORAL
  Filled 2014-03-07 (×3): qty 1

## 2014-03-07 MED ORDER — ROCURONIUM BROMIDE 50 MG/5ML IV SOLN
INTRAVENOUS | Status: AC
  Filled 2014-03-07: qty 1

## 2014-03-07 MED ORDER — RIVAROXABAN 20 MG PO TABS: 20.0000 mg | ORAL_TABLET | Freq: Every day | ORAL | Status: DC

## 2014-03-07 MED ORDER — ALUM & MAG HYDROXIDE-SIMETH 200-200-20 MG/5ML PO SUSP: 30.0000 mL | Freq: Four times a day (QID) | ORAL | Status: DC | PRN

## 2014-03-07 MED ORDER — HYDROCODONE-ACETAMINOPHEN 5-325 MG PO TABS: 1.0000 | ORAL_TABLET | Freq: Four times a day (QID) | ORAL | Status: DC | PRN

## 2014-03-07 MED ORDER — GLYCOPYRROLATE 0.2 MG/ML IJ SOLN
INTRAMUSCULAR | Status: DC | PRN
  Administered 2014-03-07: 0.4 mg via INTRAVENOUS

## 2014-03-07 MED ORDER — LACTATED RINGERS IV SOLN
INTRAVENOUS | Status: DC | PRN
  Administered 2014-03-07: 12:00:00 via INTRAVENOUS

## 2014-03-07 MED ORDER — TIZANIDINE HCL 4 MG PO TABS
4.0000 mg | ORAL_TABLET | Freq: Three times a day (TID) | ORAL | Status: DC
  Administered 2014-03-07 – 2014-03-08 (×4): 4 mg via ORAL
  Filled 2014-03-07 (×5): qty 1

## 2014-03-07 MED ORDER — HYDROMORPHONE HCL PF 1 MG/ML IJ SOLN: 0.5000 mg | INTRAMUSCULAR | Status: DC | PRN

## 2014-03-07 MED ORDER — LAMOTRIGINE 200 MG PO TABS
200.0000 mg | ORAL_TABLET | Freq: Every day | ORAL | Status: DC
  Administered 2014-03-07 – 2014-03-08 (×2): 200 mg via ORAL
  Filled 2014-03-07 (×2): qty 1

## 2014-03-07 MED ORDER — ARIPIPRAZOLE 5 MG PO TABS
5.0000 mg | ORAL_TABLET | Freq: Every morning | ORAL | Status: DC
  Administered 2014-03-08: 5 mg via ORAL
  Filled 2014-03-07: qty 1

## 2014-03-07 MED ORDER — LIDOCAINE 5 % EX PTCH
1.0000 | MEDICATED_PATCH | CUTANEOUS | Status: DC
  Administered 2014-03-07: 1 via TRANSDERMAL
  Filled 2014-03-07 (×2): qty 1

## 2014-03-07 MED ORDER — BUPIVACAINE-EPINEPHRINE PF 0.5-1:200000 % IJ SOLN
INTRAMUSCULAR | Status: DC | PRN
  Administered 2014-03-07: 10 mL

## 2014-03-07 MED ORDER — TOPIRAMATE 100 MG PO TABS
100.0000 mg | ORAL_TABLET | Freq: Every day | ORAL | Status: DC
  Administered 2014-03-07: 100 mg via ORAL
  Filled 2014-03-07 (×2): qty 1

## 2014-03-07 MED ORDER — SUCCINYLCHOLINE CHLORIDE 20 MG/ML IJ SOLN
INTRAMUSCULAR | Status: AC
  Filled 2014-03-07: qty 1

## 2014-03-07 MED ORDER — SUMATRIPTAN SUCCINATE 100 MG PO TABS: 100.0000 mg | ORAL_TABLET | ORAL | Status: DC | PRN

## 2014-03-07 MED ORDER — QUETIAPINE FUMARATE 100 MG PO TABS
100.0000 mg | ORAL_TABLET | Freq: Every day | ORAL | Status: DC
  Administered 2014-03-07: 100 mg via ORAL
  Filled 2014-03-07 (×2): qty 1

## 2014-03-07 MED ORDER — ONDANSETRON HCL 4 MG/2ML IJ SOLN: 4.0000 mg | INTRAMUSCULAR | Status: DC | PRN

## 2014-03-07 MED ORDER — ACETIC ACID 2 % OT SOLN: 4.0000 [drp] | OTIC | Status: DC

## 2014-03-07 MED ORDER — NEOSTIGMINE METHYLSULFATE 1 MG/ML IJ SOLN
INTRAMUSCULAR | Status: AC
  Filled 2014-03-07: qty 10

## 2014-03-07 MED ORDER — SERTRALINE HCL 100 MG PO TABS
200.0000 mg | ORAL_TABLET | Freq: Every day | ORAL | Status: DC
  Administered 2014-03-07: 200 mg via ORAL
  Filled 2014-03-07 (×2): qty 2

## 2014-03-07 MED ORDER — ACETAMINOPHEN 650 MG RE SUPP: 650.0000 mg | RECTAL | Status: DC | PRN

## 2014-03-07 MED ORDER — DOCUSATE SODIUM 100 MG PO CAPS: 100.0000 mg | ORAL_CAPSULE | Freq: Two times a day (BID) | ORAL | Status: DC

## 2014-03-07 MED ORDER — ONDANSETRON HCL 4 MG/2ML IJ SOLN
INTRAMUSCULAR | Status: AC
  Filled 2014-03-07: qty 2

## 2014-03-07 MED ORDER — MUPIROCIN 2 % EX OINT
TOPICAL_OINTMENT | CUTANEOUS | Status: AC
  Filled 2014-03-07: qty 22

## 2014-03-07 MED ORDER — NEOSTIGMINE METHYLSULFATE 1 MG/ML IJ SOLN
INTRAMUSCULAR | Status: DC | PRN
Start: 2014-03-07 — End: 2014-03-07
  Administered 2014-03-07: 3 mg via INTRAVENOUS

## 2014-03-07 MED ORDER — LIDOCAINE HCL (CARDIAC) 20 MG/ML IV SOLN
INTRAVENOUS | Status: DC | PRN
  Administered 2014-03-07: 40 mg via INTRAVENOUS

## 2014-03-07 MED ORDER — POLYETHYLENE GLYCOL 3350 17 G PO PACK
17.0000 g | PACK | Freq: Every day | ORAL | Status: DC
  Administered 2014-03-08: 17 g via ORAL
  Filled 2014-03-07 (×2): qty 1

## 2014-03-07 MED ORDER — FENTANYL CITRATE 0.05 MG/ML IJ SOLN
INTRAMUSCULAR | Status: AC
  Administered 2014-03-07: 25 ug via INTRAVENOUS
  Filled 2014-03-07: qty 2

## 2014-03-07 MED ORDER — OXYCODONE-ACETAMINOPHEN 5-325 MG PO TABS
1.0000 | ORAL_TABLET | ORAL | Status: DC | PRN
  Administered 2014-03-07 – 2014-03-08 (×2): 2 via ORAL
  Administered 2014-03-08: 1 via ORAL
  Administered 2014-03-08: 2 via ORAL
  Filled 2014-03-07: qty 1
  Filled 2014-03-07 (×3): qty 2

## 2014-03-07 MED ORDER — DIAZEPAM 5 MG PO TABS
5.0000 mg | ORAL_TABLET | Freq: Four times a day (QID) | ORAL | Status: DC | PRN
  Administered 2014-03-07 – 2014-03-08 (×2): 5 mg via ORAL
  Filled 2014-03-07 (×2): qty 1

## 2014-03-07 MED ORDER — PHENOL 1.4 % MT LIQD: 1.0000 | OROMUCOSAL | Status: DC | PRN

## 2014-03-07 MED ORDER — PROPOFOL 10 MG/ML IV BOLUS
INTRAVENOUS | Status: AC
  Filled 2014-03-07: qty 20

## 2014-03-07 MED ORDER — METOCLOPRAMIDE HCL 5 MG/ML IJ SOLN: 10.0000 mg | Freq: Once | INTRAMUSCULAR | Status: DC | PRN

## 2014-03-07 MED ORDER — MUPIROCIN 2 % EX OINT
TOPICAL_OINTMENT | Freq: Two times a day (BID) | CUTANEOUS | Status: DC
  Administered 2014-03-07 – 2014-03-08 (×3): via NASAL
  Filled 2014-03-07: qty 22

## 2014-03-07 MED ORDER — BACLOFEN 20 MG PO TABS
20.0000 mg | ORAL_TABLET | Freq: Four times a day (QID) | ORAL | Status: DC
  Administered 2014-03-07 (×2): 20 mg via ORAL
  Filled 2014-03-07 (×6): qty 1

## 2014-03-07 MED ORDER — ONDANSETRON HCL 4 MG/2ML IJ SOLN
INTRAMUSCULAR | Status: DC | PRN
  Administered 2014-03-07: 4 mg via INTRAVENOUS

## 2014-03-07 MED ORDER — FENTANYL CITRATE 0.05 MG/ML IJ SOLN
INTRAMUSCULAR | Status: AC
  Filled 2014-03-07: qty 5

## 2014-03-07 SURGICAL SUPPLY — 83 items
APL SKNCLS STERI-STRIP NONHPOA (GAUZE/BANDAGES/DRESSINGS) ×1
BAG DECANTER FOR FLEXI CONT (MISCELLANEOUS) ×3 IMPLANT
BENZOIN TINCTURE PRP APPL 2/3 (GAUZE/BANDAGES/DRESSINGS) ×3 IMPLANT
BLADE CLIPPER SURG NEURO (BLADE) ×6 IMPLANT
BLADE SURG 10 STRL SS (BLADE) ×6 IMPLANT
BLADE SURG 15 STRL LF DISP TIS (BLADE) ×1 IMPLANT
BLADE SURG 15 STRL SS (BLADE) ×3
BOOT SUTURE AID YELLOW STND (SUTURE) IMPLANT
BRUSH SCRUB EZ 1% IODOPHOR (MISCELLANEOUS) ×1 IMPLANT
CANISTER SUCT 3000ML (MISCELLANEOUS) ×3 IMPLANT
CLOSURE WOUND 1/2 X4 (GAUZE/BANDAGES/DRESSINGS) ×1
CONT SPEC 4OZ CLIKSEAL STRL BL (MISCELLANEOUS) ×3 IMPLANT
CORDS BIPOLAR (ELECTRODE) ×3 IMPLANT
COVER MAYO STAND STRL (DRAPES) ×3 IMPLANT
DRAPE C-ARM 42X72 X-RAY (DRAPES) ×6 IMPLANT
DRAPE INCISE IOBAN 85X60 (DRAPES) ×3 IMPLANT
DRAPE ORTHO SPLIT 77X108 STRL (DRAPES) ×6
DRAPE POUCH INSTRU U-SHP 10X18 (DRAPES) ×3 IMPLANT
DRAPE PROXIMA HALF (DRAPES) ×3 IMPLANT
DRAPE SURG 17X23 STRL (DRAPES) ×18 IMPLANT
DRAPE SURG ORHT 6 SPLT 77X108 (DRAPES) ×2 IMPLANT
ELECT REM PT RETURN 9FT ADLT (ELECTROSURGICAL) ×3
ELECTRODE REM PT RTRN 9FT ADLT (ELECTROSURGICAL) ×1 IMPLANT
GAUZE SPONGE 4X4 16PLY XRAY LF (GAUZE/BANDAGES/DRESSINGS) ×6 IMPLANT
GLOVE BIO SURGEON STRL SZ8.5 (GLOVE) ×3 IMPLANT
GLOVE BIOGEL PI IND STRL 7.5 (GLOVE) IMPLANT
GLOVE BIOGEL PI INDICATOR 7.5 (GLOVE) ×4
GLOVE ECLIPSE 7.5 STRL STRAW (GLOVE) ×4 IMPLANT
GLOVE EXAM NITRILE LRG STRL (GLOVE) IMPLANT
GLOVE EXAM NITRILE MD LF STRL (GLOVE) ×4 IMPLANT
GLOVE EXAM NITRILE XL STR (GLOVE) IMPLANT
GLOVE EXAM NITRILE XS STR PU (GLOVE) IMPLANT
GLOVE SS BIOGEL STRL SZ 8 (GLOVE) ×1 IMPLANT
GLOVE SUPERSENSE BIOGEL SZ 8 (GLOVE) ×2
GLOVE SURG SS PI 7.0 STRL IVOR (GLOVE) ×4 IMPLANT
GOWN BRE IMP SLV AUR LG STRL (GOWN DISPOSABLE) ×1 IMPLANT
GOWN BRE IMP SLV AUR XL STRL (GOWN DISPOSABLE) IMPLANT
GOWN STRL REUS W/ TWL LRG LVL3 (GOWN DISPOSABLE) IMPLANT
GOWN STRL REUS W/ TWL XL LVL3 (GOWN DISPOSABLE) IMPLANT
GOWN STRL REUS W/TWL LRG LVL3 (GOWN DISPOSABLE) ×3
GOWN STRL REUS W/TWL XL LVL3 (GOWN DISPOSABLE) ×3
KIT BASIN OR (CUSTOM PROCEDURE TRAY) ×3 IMPLANT
KIT REVIS PUMP CNNECTR SUTLESS IMPLANT
KIT ROOM TURNOVER OR (KITS) ×3 IMPLANT
MARKER SKIN DUAL TIP RULER LAB (MISCELLANEOUS) ×3 IMPLANT
NDL HYPO 18GX1.5 BLUNT FILL (NEEDLE) ×1 IMPLANT
NDL HYPO 25X1 1.5 SAFETY (NEEDLE) IMPLANT
NEEDLE HYPO 18GX1.5 BLUNT FILL (NEEDLE) ×3 IMPLANT
NEEDLE HYPO 22GX1.5 SAFETY (NEEDLE) ×3 IMPLANT
NEEDLE HYPO 25X1 1.5 SAFETY (NEEDLE) ×3 IMPLANT
NS IRRIG 1000ML POUR BTL (IV SOLUTION) ×3 IMPLANT
PACK EENT II TURBAN DRAPE (CUSTOM PROCEDURE TRAY) ×3 IMPLANT
PAD ARMBOARD 7.5X6 YLW CONV (MISCELLANEOUS) ×9 IMPLANT
PATTIES SURGICAL .5 X.5 (GAUZE/BANDAGES/DRESSINGS) IMPLANT
PATTIES SURGICAL 1X1 (DISPOSABLE) IMPLANT
PENCIL BUTTON HOLSTER BLD 10FT (ELECTRODE) ×3 IMPLANT
PUMP CONNECTOR REV KIT SUTLESS ×3 IMPLANT
SHEATH PERITONEAL INTRO 46 (MISCELLANEOUS) IMPLANT
SHEATH PERITONEAL INTRO 61 (MISCELLANEOUS) IMPLANT
SPONGE GAUZE 4X4 12PLY (GAUZE/BANDAGES/DRESSINGS) ×2 IMPLANT
SPONGE LAP 4X18 X RAY DECT (DISPOSABLE) ×3 IMPLANT
STAPLER SKIN PROX WIDE 3.9 (STAPLE) ×3 IMPLANT
STRIP CLOSURE SKIN 1/2X4 (GAUZE/BANDAGES/DRESSINGS) ×1 IMPLANT
SUT BONE WAX W31G (SUTURE) IMPLANT
SUT ETHILON 3 0 PS 1 (SUTURE) IMPLANT
SUT SILK 0 TIES 10X30 (SUTURE) IMPLANT
SUT SILK 2 0 FS (SUTURE) ×3 IMPLANT
SUT SILK 3 0 SH 30 (SUTURE) IMPLANT
SUT VIC AB 0 CT1 18XCR BRD8 (SUTURE) ×1 IMPLANT
SUT VIC AB 0 CT1 8-18 (SUTURE) ×3
SUT VIC AB 2-0 CP2 18 (SUTURE) ×3 IMPLANT
SYR 20CC LL (SYRINGE) ×8 IMPLANT
SYR 3ML LL SCALE MARK (SYRINGE) ×3 IMPLANT
SYR 5ML LL (SYRINGE) ×3 IMPLANT
SYR CONTROL 10ML LL (SYRINGE) ×3 IMPLANT
Synchromed II pump (Neuro Prosthesis/Implant) ×2 IMPLANT
TAPE CLOTH SURG 4X10 WHT LF (GAUZE/BANDAGES/DRESSINGS) ×2 IMPLANT
TOWEL OR 17X24 6PK STRL BLUE (TOWEL DISPOSABLE) ×3 IMPLANT
TOWEL OR 17X26 10 PK STRL BLUE (TOWEL DISPOSABLE) ×3 IMPLANT
TUBE CONNECTING 12'X1/4 (SUCTIONS) ×1
TUBE CONNECTING 12X1/4 (SUCTIONS) ×2 IMPLANT
UNDERPAD 30X30 INCONTINENT (UNDERPADS AND DIAPERS) ×3 IMPLANT
WATER STERILE IRR 1000ML POUR (IV SOLUTION) ×3 IMPLANT

## 2014-03-07 NOTE — Anesthesia Postprocedure Evaluation (Signed)
Anesthesia Post Note  Patient: Mindi SlickerJudith Donate  Procedure(s) Performed: Procedure(s) (LRB): baclofen pump revision/replacement and Catheter connection replacement (N/A)  Anesthesia type: General  Patient location: PACU  Post pain: Pain level controlled  Post assessment: Patient's Cardiovascular Status Stable  Last Vitals:  Filed Vitals:   03/07/14 1446  BP: 138/62  Pulse: 87  Temp:   Resp: 10    Post vital signs: Reviewed and stable  Level of consciousness: alert  Complications: No apparent anesthesia complications

## 2014-03-07 NOTE — Transfer of Care (Signed)
Immediate Anesthesia Transfer of Care Note  Patient: [name] SlickerJudith Mcgroarty  Procedure(s) Performed: Procedure(s) with comments: baclofen pump revision/replacement and Catheter connection replacement (N/A) - baclofen pump revision/replacement and Catheter connection replacement  Patient Location: PACU  Anesthesia Type:General  Level of Consciousness: awake, alert , oriented and patient cooperative  Airway & Oxygen Therapy: Patient Spontanous Breathing and Patient connected to nasal cannula oxygen  Post-op Assessment: Report given to PACU RN, Post -op Vital signs reviewed and stable and Patient moving all extremities  Post vital signs: Reviewed and stable  Complications: No apparent anesthesia complications

## 2014-03-07 NOTE — Op Note (Signed)
Brief history: The patient is a 38 year old white female with a history of cerebral palsy and spasticity. She has had a baclofen pump placed years ago and had several revisions. Her present baclofen pumps battery is low. I discussed situation with the patient. We discussed the various treatment options. She has decided proceed with a placement of her baclofen pump.  Preop diagnosis: Baclofen pump malfunction/low battery  Postop diagnosis: The same  Procedure: Baclofen pump revision (replacement of baclofen pump and connector)  Surgeon: Dr. Delma OfficerJeff Earmon Sherrow  Assistant: None  Anesthesia: Gen. endotracheal  Estimated blood loss: Minimal  Specimens: None  Drains: None  Complications: None  Description of procedure: The patient was brought to the operating room by the anesthesia team. General endotracheal anesthesia was induced. The patient was placed in the lateral position with a left side down. Her abdomen, right flank, and right lower back were prepared with Betadine scrub and Betadine solution. Sterile drapes were applied. I then injected the area to be incised with Marcaine with epinephrine solution. I made an incision in the patient's right upper quadrant of her abdomen just above her old surgical scar.( I did not want to go through the old surgical scar as she has had multiple revisions) I used the Metzenbaum scissors and electrocautery to expose the old baclofen pump. I removed the pump. I then disconnected the thecal catheter from the pump. I used a 3 cc syringe to aspirate approximately 3/4 of a cc of as a/spinal fluid from the thecal catheter. After doing this there was good spontaneous flow of cerebrospinal fluid through the catheter. I inspected the connector. There appeared to be a bit of a kink in the connector so I replaced the connector by removing 1 cm of the thecal catheter and I then laced a new connector. The new baclofen pump was then filled with 40 cc of baclofen. I connected the  new pump to the thecal catheter appear we step is in place. I then placed a new baclofen pump in pocket from the old baclofen pump. I inspected the thecal catheter to make sure that there were no kinks. I secured the pump in place with interrupted 0 Vicryl suture. I then reapproximated the patient's subcutaneous tissue with interrupted 2-0 Vicryl suture. I then reapproximated the skin with Steri-Strips and benzoin. I then applied a sterile dressing. The drapes were removed. This ended the procedure. By report all sponge instrument and needle counts were correct at the end of this case.

## 2014-03-07 NOTE — Anesthesia Preprocedure Evaluation (Signed)
Anesthesia Evaluation  Patient identified by MRN, date of birth, ID band Patient awake    Reviewed: Allergy & Precautions, H&P , NPO status , Patient's Chart, lab work & pertinent test results, reviewed documented beta blocker date and time   Airway Mallampati: II TM Distance: >3 FB Neck ROM: full    Dental   Pulmonary neg pulmonary ROS,  breath sounds clear to auscultation        Cardiovascular hypertension, On Medications Rhythm:regular     Neuro/Psych  Headaches, PSYCHIATRIC DISORDERS Anxiety Depression  Neuromuscular disease CVA, Residual Symptoms    GI/Hepatic Neg liver ROS, GERD-  Medicated and Controlled,  Endo/Other  negative endocrine ROS  Renal/GU negative Renal ROS  negative genitourinary   Musculoskeletal   Abdominal   Peds  (+) Neurological problem Hematology  (+) anemia ,   Anesthesia Other Findings See surgeon's H&P   Reproductive/Obstetrics negative OB ROS                           Anesthesia Physical Anesthesia Plan  ASA: III  Anesthesia Plan: General   Post-op Pain Management:    Induction: Intravenous  Airway Management Planned: Oral ETT  Additional Equipment:   Intra-op Plan:   Post-operative Plan: Extubation in OR  Informed Consent: I have reviewed the patients History and Physical, chart, labs and discussed the procedure including the risks, benefits and alternatives for the proposed anesthesia with the patient or authorized representative who has indicated his/her understanding and acceptance.   Dental Advisory Given  Plan Discussed with: CRNA and Surgeon  Anesthesia Plan Comments:         Anesthesia Quick Evaluation

## 2014-03-07 NOTE — Progress Notes (Signed)
Patient ID: [name] SlickerJudith [name], female   DOB: [birth year], 38 y.o.   MRN: [id number] Subjective:   The patient is somnolent but easily arousable. She is in no apparent distress.  Objective: Vital signs in last 24 hours: Temp:  [98.8 F (37.1 C)] 98.8 F (37.1 C) (04/06 0657) Pulse Rate:  [70] 70 (04/06 0657) Resp:  [18] 18 (04/06 0657) BP: (98)/(52) 98/52 mmHg (04/06 0657) SpO2:  [100 %] 100 % (04/06 0657)  Intake/Output from previous day:   Intake/Output this shift: Total I/O In: 950 [I.V.:950] Out: -   Physical exam patient is, but easily arousable.  Lab Results:  Recent Labs  03/07/14 0743  WBC 7.5  HGB 10.4*  HCT 32.8*  PLT 335   BMET  Recent Labs  03/07/14 0743  NA 142  K 4.0  CL 105  CO2 22  GLUCOSE 96  BUN 12  CREATININE 0.49*  CALCIUM 8.6    Studies/Results: No results found.  Assessment/Plan: Patient appears to be doing well. We will plan to observe her overnight and likely send her home tomorrow. Dr. Newell CoralNudelman is going to see her in my absence.  LOS: 0 days     [name] [name] D 03/07/2014, 1:38 PM

## 2014-03-07 NOTE — Progress Notes (Signed)
Med consult  Going home tomorrow. Pt had baclofen pump exchange today. D/w Dr. Lovell SheehanJenkins, hold xarelto until she is dc tomorrow. Will hold ear drops also  Ulyses SouthwardMinh Jamyia Fortune, PharmD Pager: [phone] 03/07/2014 3:41 PM

## 2014-03-07 NOTE — H&P (Signed)
Subjective: The patient is a 38 year old white female with cerebral palsy and spasticity. Her spasticity has been managed with a baclofen pump via Dr. Sharene SkeansHickling. The pumps at her he has alarmed. Dr. Sharene SkeansHickling has asked me to replace the pump. I discussed situation with the patient. We have discussed the various treatment options including a revision of the pump. Patient has decided proceed with surgery.   Past Medical History  Diagnosis Date  . Cerebral palsy   . Pulmonary embolism     Lifetime Coumadin  . Cerebral palsy     Spastic Cerebral palsy, mentally intact  . Contracture, joint, multiple sites     Electric wheelchair, uses left hand to operate chair.   [name] Kitchen. Hypertension   . OCD (obsessive compulsive disorder)   . Depression   . Migraine   . [name]   . Dysarthria   . Endometriosis   . History of recurrent UTIs   . GERD (gastroesophageal reflux disease)   . Pulmonary embolism 2011, 01/2011    Will be on lifetime coumadin  . Anemia   . Abdominal pain 01/02/2012    Overview:  Overview:  Per Previous pcp note- Dr. Ta--Pt has history of endometriosis and had DUB in 2011.  She She had u/s that showed normal endometrial stripe.  She had endometrial bx that was wnl.  She has a lot of abd cramping every month.  This cramping was sometimes not relieved with hydrocodone.  She was [facility] to [facility] for IUD placement to help endometriosis and abd cramping.  [name]  . Macroscopic hematuria 03/10/2013    status post replacement of suprapubic tube 03/04/2013   . HYPERTENSION, BENIGN 01/31/2010    Qualifier: Diagnosis of  By: [name] RomneyBensimhon, MD, [name] DredgeFACC, [name] [name]     Past Surgical History  Procedure Laterality Date  . Colposcopy  06/2000  . Cesarean section      x 2  . Tubal ligation  2003  . Carpal tunnel release  08/2008    Dr [name] [name]  . Wrist surgery  06/2010    Dr [name] [name], hand surgeon, [name] [name]  . Baclofen pump refill      x 3 times  . Appendectomy    . Intrauterine device  insertion  04/30/11    Inserted by [facility] for endometriosis  . Laparoscopically assisted vag hysterectomy  10/27/2012  . Multiple extractions with alveoloplasty Bilateral 07/19/2013    Procedure: EXTRACTIONS #4, 1,61,09,605,11,16,19;  Surgeon: [name] [name], DDS;  [facility];  Service: Oral Surgery;  Laterality: Bilateral;    Allergies  Allergen Reactions  . Morphine Dermatitis    Skin turned red    History  Substance Use Topics  . Smoking status: Never Smoker   . Smokeless tobacco: Never Used  . Alcohol Use: 0.6 oz/week    1 Glasses of wine per week     Comment: Drinks alcohol once a month.    Family History  Problem Relation Age of Onset  . Asthma Father   . Colon cancer Maternal Grandmother     Died in her 760's  . Cancer Paternal Grandmother   . Heart attack Maternal Grandfather     Died in his 2470's  . Breast cancer Paternal Grandmother     Died in her 7470's  . Alzheimer's disease Paternal Grandfather     Died in his 4480's   Prior to Admission medications   Medication Sig Start Date End Date Taking? Authorizing Provider  acetaminophen (TYLENOL) 325  MG tablet Take 2 tablets (650 mg total) by mouth every 8 (eight) hours. 09/09/13  Yes [name] Nydia Bouton, MD  acetic acid (VOSOL) 2 % otic solution Place 4 drops into both ears once a week.   Yes Historical Provider, MD  ARIPiprazole (ABILIFY) 5 MG tablet Take 5 mg by mouth every morning.    Yes Historical Provider, MD  bacitracin-polymyxin b (POLYSPORIN) ointment Apply topically 2 (two) times daily as needed (at cath site for redness, discharge).   Yes Historical Provider, MD  baclofen (LIORESAL) 10 MG tablet Take 2 tablets (20 mg total) by mouth 4 (four) times daily. 12/30/13  Yes Garnetta Buddy, MD  clonazePAM (KLONOPIN) 1 MG tablet Take 1 tablet (1 mg total) by mouth 4 (four) times daily. 02/03/14  Yes [name] C Funches, MD  docusate sodium (COLACE) 100 MG capsule Take 100 mg by mouth 2 (two) times daily.   Yes  Historical Provider, MD  ibuprofen (ADVIL,MOTRIN) 200 MG tablet Take 400 mg by mouth every 8 (eight) hours as needed for pain.   Yes Historical Provider, MD  lamoTRIgine (LAMICTAL) 200 MG tablet Take 200 mg by mouth daily.    Yes Historical Provider, MD  lidocaine (LIDODERM) 5 % Place 1 patch onto the skin daily. Remove & Discard patch within 12 hours or as directed by MD   Yes Historical Provider, MD  loratadine (CLARITIN) 10 MG tablet Take 10 mg by mouth daily as needed for allergies.   Yes Historical Provider, MD  Multiple Vitamin (MULTIVITAMIN WITH MINERALS) TABS Take 1 tablet by mouth every evening.    Yes Historical Provider, MD  polyethylene glycol (MIRALAX / GLYCOLAX) packet Take 17 g by mouth daily.   Yes Historical Provider, MD  QUEtiapine (SEROQUEL) 100 MG tablet Take 100 mg by mouth at bedtime.   Yes Historical Provider, MD  Rivaroxaban (XARELTO) 20 MG TABS Take 1 tablet (20 mg total) by mouth daily with supper. 03/16/13  Yes [name] A Kuneff, DO  sertraline (ZOLOFT) 100 MG tablet Take 200 mg by mouth at bedtime.    Yes Historical Provider, MD  SUMAtriptan (IMITREX) 100 MG tablet Take 100 mg by mouth every 2 (two) hours as needed for migraine.    Yes Historical Provider, MD  tiZANidine (ZANAFLEX) 4 MG capsule Take 1 capsule (4 mg total) by mouth 3 (three) times daily. 12/30/13  Yes Garnetta Buddy, MD  topiramate (TOPAMAX) 100 MG tablet Take 100 mg by mouth at bedtime.    Yes Historical Provider, MD  HYDROcodone-acetaminophen (NORCO/VICODIN) 5-325 MG per tablet Take 1-2 tablets by mouth every 6 (six) hours as needed for severe pain. 03/07/14   [name] Rising, MD     Review of Systems  Positive ROS: As above  All other systems have been reviewed and were otherwise negative with the exception of those mentioned in the HPI and as above.  Objective: Vital signs in last 24 hours: Temp:  [98.8 F (37.1 C)] 98.8 F (37.1 C) (04/06 0657) Pulse Rate:  [70] 70 (04/06 0657) Resp:  [18] 18  (04/06 0657) BP: (98)/(52) 98/52 mmHg (04/06 0657) SpO2:  [100 %] 100 % (04/06 0657)  General: A small, spastic, 38 year old white female in a wheelchair in no apparent distress.  HEENT: Normocephalic  Thorax: Symmetric  Lungs: Clear  Heart: Regular rhythm  Abdomen: Soft, her baclofen pump insertion incision is well-healed.  Back: Her lumbar incision is well-healed  Neurologic exam: The patient is quadriparetic, spastic, she speaks very slowly,  she is oriented x3.   Data Review Lab Results  Component Value Date   WBC 7.5 03/07/2014   HGB 10.4* 03/07/2014   HCT 32.8* 03/07/2014   MCV 80.4 03/07/2014   PLT 335 03/07/2014   Lab Results  Component Value Date   NA 142 03/07/2014   K 4.0 03/07/2014   CL 105 03/07/2014   CO2 22 03/07/2014   BUN 12 03/07/2014   CREATININE 0.49* 03/07/2014   GLUCOSE 96 03/07/2014   Lab Results  Component Value Date   INR 2.10* 07/29/2011    Assessment/Plan: Baclofen pump function/low battery alarm: I discussed situation with the patient. We have reviewed the various treatment options including a placement of her baclofen pump, and possibly replacement of her thecal catheter if it is not patent. I described the surgery to her. We have discussed the risks, benefits, alternatives, and likelihood of achieving our goals with surgery. I've answered all the patient's, and her father's, questions. She has decided to proceed with surgery.   Maryalice Pasley D 03/07/2014 11:06 AM

## 2014-03-07 NOTE — Progress Notes (Signed)
Patient with poor vascular access. Attempted IV x 1. Notified Heather in Neuro OR for anesthesia to attempt IV access

## 2014-03-07 NOTE — Progress Notes (Signed)
Verified meds and NPO status with Robet LeuSama, RN

## 2014-03-08 MED ORDER — OXYCODONE-ACETAMINOPHEN 5-325 MG PO TABS: 1.0000 | ORAL_TABLET | ORAL | Status: DC | PRN

## 2014-03-08 MED ORDER — SODIUM CHLORIDE 0.9 % IV BOLUS (SEPSIS)
500.0000 mL | Freq: Once | INTRAVENOUS | Status: AC
  Administered 2014-03-08: 500 mL via INTRAVENOUS

## 2014-03-08 MED ORDER — DIAZEPAM 5 MG PO TABS: 5.0000 mg | ORAL_TABLET | Freq: Four times a day (QID) | ORAL | Status: DC | PRN

## 2014-03-08 NOTE — Discharge Summary (Signed)
Physician Discharge Summary  Patient ID: [name] [name] MRN: [id number] DOB/AGE: 40-Jan-1977 40 y.o.  Admit date: 03/07/2014 Discharge date: 03/08/2014  Admission Diagnoses:  Baclofen pump malfunction/low battery   Discharge Diagnoses:  Baclofen pump malfunction/low battery   Active Problems:   Baclofen pump failure  Discharged Condition: stable  Hospital Course: Patient was admitted by Dr. Tressie Stalker, who performed a for placement of a baclofen pump and connector, because of a baclofen pump malfunction/low battery. Postoperatively she had moderate incisional pain, which is entry with Percocet and Valium. Wound is clean and dry, the bandage was left intact. The bandage be changed on Wednesday, April 8, and removed on Thursday, April 9, and the wound left open to air. The Steri-Strips should be removed from the wound on Monday, April 13. The patient is being discharged back to her residence at [facility] skilled nursing facility, and she is to return for followup with Dr. Lovell Sheehan in about 3 weeks at his office.  Discharge Exam: Blood pressure 101/58, pulse 79, temperature 98.4 F (36.9 C), temperature source Oral, resp. rate 18, height 4' 11.06" (1.5 m), weight 52.2 kg (115 lb 1.3 oz), last menstrual period 04/05/2011, SpO2 99.00%.  Disposition: [facility] skilled nursing facility     Medication List         acetaminophen 325 MG tablet  Commonly known as:  TYLENOL  Take 2 tablets (650 mg total) by mouth every 8 (eight) hours.     acetic acid 2 % otic solution  Commonly known as:  VOSOL  Place 4 drops into both ears once a week.     ARIPiprazole 5 MG tablet  Commonly known as:  ABILIFY  Take 5 mg by mouth every morning.     bacitracin-polymyxin b ointment  Commonly known as:  POLYSPORIN  Apply topically 2 (two) times daily as needed (at cath site for redness, discharge).     baclofen 10 MG tablet  Commonly known as:  LIORESAL  Take 2 tablets (20 mg total) by mouth 4 (four)  times daily.     clonazePAM 1 MG tablet  Commonly known as:  KLONOPIN  Take 1 tablet (1 mg total) by mouth 4 (four) times daily.     diazepam 5 MG tablet  Commonly known as:  VALIUM  Take 1 tablet (5 mg total) by mouth every 6 (six) hours as needed for muscle spasms.     docusate sodium 100 MG capsule  Commonly known as:  COLACE  Take 100 mg by mouth 2 (two) times daily.     HYDROcodone-acetaminophen 5-325 MG per tablet  Commonly known as:  NORCO/VICODIN  Take 1-2 tablets by mouth every 6 (six) hours as needed for severe pain.     ibuprofen 200 MG tablet  Commonly known as:  ADVIL,MOTRIN  Take 400 mg by mouth every 8 (eight) hours as needed for pain.     lamoTRIgine 200 MG tablet  Commonly known as:  LAMICTAL  Take 200 mg by mouth daily.     lidocaine 5 %  Commonly known as:  LIDODERM  Place 1 patch onto the skin daily. Remove & Discard patch within 12 hours or as directed by MD     loratadine 10 MG tablet  Commonly known as:  CLARITIN  Take 10 mg by mouth daily as needed for allergies.     multivitamin with minerals Tabs tablet  Take 1 tablet by mouth every evening.     oxyCODONE-acetaminophen 5-325 MG per tablet  Commonly known  as:  PERCOCET/ROXICET  Take 1-2 tablets by mouth every 4 (four) hours as needed for moderate pain or severe pain.     polyethylene glycol packet  Commonly known as:  MIRALAX / GLYCOLAX  Take 17 g by mouth daily.     QUEtiapine 100 MG tablet  Commonly known as:  SEROQUEL  Take 100 mg by mouth at bedtime.     Rivaroxaban 20 MG Tabs tablet  Commonly known as:  XARELTO  Take 1 tablet (20 mg total) by mouth daily with supper.     sertraline 100 MG tablet  Commonly known as:  ZOLOFT  Take 200 mg by mouth at bedtime.     SUMAtriptan 100 MG tablet  Commonly known as:  IMITREX  Take 100 mg by mouth every 2 (two) hours as needed for migraine.     tiZANidine 4 MG capsule  Commonly known as:  ZANAFLEX  Take 1 capsule (4 mg total) by mouth 3  (three) times daily.     topiramate 100 MG tablet  Commonly known as:  TOPAMAX  Take 100 mg by mouth at bedtime.         SignedHewitt Shorts: NUDELMAN,ROBERT W, MD 03/08/2014, 8:09 AM

## 2014-03-08 NOTE — Progress Notes (Signed)
Called MD about low BP, asymptomatic patient, ordered 500cc bolus, still can be discharged

## 2014-03-08 NOTE — Clinical Social Work Note (Signed)
CSW has faxed discharge summary to [facility]. CSW confirmed with admissions liaison at [facility]'s admission to their facility on 03/08/2014. Discharge packet is complete and has been placed on pt's shadow chart. CSW has notified pt's father, [name], regarding [facility]'s return [facility] on [facility]/2014. Ambulance transportation has been arranged via [facility]. Per RN, RN has called report to facility.  [facility] [phone]  Darlyn ChamberEmily [place], LCSWA Clinical Social Worker [phone]

## 2014-03-08 NOTE — Progress Notes (Signed)
Neurosurgery office fax number: [phone]  Spoke to [facility] office, letting them know we will be faxing over FL-2 for Dr Jule SerNudleman to sign and fax back to our Child psychotherapistsocial worker.

## 2014-03-08 NOTE — Progress Notes (Signed)
Called report to [place], Charity fundraiserN at [facility]. Read them the wound care instructions verbatim from Dr Antony ContrasNudleman's note:    " The bandage (should) be changed on Wednesday, April 8, and removed on Thursday, April 9, and the wound left open to air. The Steri-Strips should be removed from the wound on Monday, April 13."  Facility had no further questions as they said they know patient well, will cont to monitor until discharge.  [place], [name] [name]

## 2014-03-08 NOTE — Clinical Social Work Psychosocial (Signed)
Clinical Social Work Department BRIEF PSYCHOSOCIAL ASSESSMENT 03/08/2014  Patient:  ANGENI, CHAUDHURI     Account Number:  [id number]     Admit date:  03/07/2014  Clinical Social Worker:  Donna Christen  Date/Time:  03/08/2014 12:56 PM  Referred by:  Physician  Date Referred:  03/08/2014 Referred for  SNF Placement   Other Referral:   none   Interview type:  Patient Other interview type:   CSW also spoke with pt's long-term facility and chart review.    PSYCHOSOCIAL DATA Living Status:  FACILITY Admitted from facility:  [facility] Level of care:  [place] Primary support name:  Sharion Grieves Primary support relationship to patient:  PARENT Degree of support available:   Strong support system.    CURRENT CONCERNS Current Concerns  Post-Acute Placement   Other Concerns:   none.    SOCIAL WORK ASSESSMENT / PLAN CSW received consult for pt to return to SNF. Per chart review, pt is from [facility]. CSW met with pt at bedside to inform pt she would be returning to [place] on 03/08/2014. Pt has limited communication. CSW contacted [facility] regarding pt's admission to their facility on 03/08/2014. Per [place], pt able to return to their facility.   Assessment/plan status:  Psychosocial Support/Ongoing Assessment of Needs Other assessment/ plan:   none.   Information/referral to community resources:   Pt returning to [facility].    PATIENTS/FAMILYS RESPONSE TO PLAN OF CARE: Pt was understanding and agreeable to CSW plan of care. CSW has left pt's father a message regarding pt's discharge.       Pati Gallo, [place] Social Worker [phone]

## 2014-03-09 ENCOUNTER — Other Ambulatory Visit: Payer: Self-pay | Admitting: Family Medicine

## 2014-03-09 MED ORDER — CLONAZEPAM 1 MG PO TABS: 1.0000 mg | ORAL_TABLET | Freq: Four times a day (QID) | ORAL | Status: DC

## 2014-03-09 NOTE — Telephone Encounter (Signed)
[facility]. [facility]  Refilled clonazepam 120 with 2 RF. D/c oxy as patient has prn vicodin.

## 2014-03-10 ENCOUNTER — Non-Acute Institutional Stay (INDEPENDENT_AMBULATORY_CARE_PROVIDER_SITE_OTHER): Payer: Medicaid Other | Admitting: Family Medicine

## 2014-03-10 ENCOUNTER — Inpatient Hospital Stay (HOSPITAL_COMMUNITY)
Admission: EM | Admit: 2014-03-10 | Discharge: 2014-03-15 | DRG: 689 | Disposition: A | Discharge status: Home / Self Care | Payer: Medicaid Other | Attending: Family Medicine | Admitting: Family Medicine

## 2014-03-10 ENCOUNTER — Other Ambulatory Visit: Payer: Self-pay

## 2014-03-10 ENCOUNTER — Emergency Department (HOSPITAL_COMMUNITY): Payer: Medicaid Other

## 2014-03-10 ENCOUNTER — Encounter (HOSPITAL_COMMUNITY): Payer: Self-pay | Admitting: Emergency Medicine

## 2014-03-10 DIAGNOSIS — M2450 Contracture, unspecified joint: Secondary | ICD-10-CM | POA: Diagnosis present

## 2014-03-10 DIAGNOSIS — R109 Unspecified abdominal pain: Secondary | ICD-10-CM

## 2014-03-10 DIAGNOSIS — Z86711 Personal history of pulmonary embolism: Secondary | ICD-10-CM

## 2014-03-10 DIAGNOSIS — B9689 Other specified bacterial agents as the cause of diseases classified elsewhere: Secondary | ICD-10-CM | POA: Diagnosis present

## 2014-03-10 DIAGNOSIS — R532 Functional quadriplegia: Secondary | ICD-10-CM | POA: Diagnosis present

## 2014-03-10 DIAGNOSIS — Z9359 Other cystostomy status: Secondary | ICD-10-CM

## 2014-03-10 DIAGNOSIS — A419 Sepsis, unspecified organism: Secondary | ICD-10-CM | POA: Diagnosis present

## 2014-03-10 DIAGNOSIS — F339 Major depressive disorder, recurrent, unspecified: Secondary | ICD-10-CM

## 2014-03-10 DIAGNOSIS — Z79899 Other long term (current) drug therapy: Secondary | ICD-10-CM

## 2014-03-10 DIAGNOSIS — F411 Generalized anxiety disorder: Secondary | ICD-10-CM

## 2014-03-10 DIAGNOSIS — G894 Chronic pain syndrome: Secondary | ICD-10-CM

## 2014-03-10 DIAGNOSIS — Z7901 Long term (current) use of anticoagulants: Secondary | ICD-10-CM

## 2014-03-10 DIAGNOSIS — G801 Spastic diplegic cerebral palsy: Secondary | ICD-10-CM

## 2014-03-10 DIAGNOSIS — Z9350 Unspecified cystostomy status: Secondary | ICD-10-CM

## 2014-03-10 DIAGNOSIS — R471 Dysarthria and anarthria: Secondary | ICD-10-CM

## 2014-03-10 DIAGNOSIS — Z9889 Other specified postprocedural states: Secondary | ICD-10-CM

## 2014-03-10 DIAGNOSIS — I10 Essential (primary) hypertension: Secondary | ICD-10-CM

## 2014-03-10 DIAGNOSIS — D649 Anemia, unspecified: Secondary | ICD-10-CM | POA: Diagnosis present

## 2014-03-10 DIAGNOSIS — F3289 Other specified depressive episodes: Secondary | ICD-10-CM | POA: Diagnosis present

## 2014-03-10 DIAGNOSIS — G809 Cerebral palsy, unspecified: Secondary | ICD-10-CM | POA: Diagnosis present

## 2014-03-10 DIAGNOSIS — Z593 Problems related to living in residential institution: Secondary | ICD-10-CM

## 2014-03-10 DIAGNOSIS — F429 Obsessive-compulsive disorder, unspecified: Secondary | ICD-10-CM | POA: Diagnosis present

## 2014-03-10 DIAGNOSIS — R259 Unspecified abnormal involuntary movements: Secondary | ICD-10-CM | POA: Diagnosis present

## 2014-03-10 DIAGNOSIS — K5900 Constipation, unspecified: Secondary | ICD-10-CM | POA: Diagnosis present

## 2014-03-10 DIAGNOSIS — F329 Major depressive disorder, single episode, unspecified: Secondary | ICD-10-CM | POA: Diagnosis present

## 2014-03-10 DIAGNOSIS — N319 Neuromuscular dysfunction of bladder, unspecified: Secondary | ICD-10-CM | POA: Diagnosis present

## 2014-03-10 DIAGNOSIS — T85610A Breakdown (mechanical) of epidural and subdural infusion catheter, initial encounter: Secondary | ICD-10-CM

## 2014-03-10 DIAGNOSIS — N390 Urinary tract infection, site not specified: Principal | ICD-10-CM | POA: Diagnosis present

## 2014-03-10 LAB — COMPREHENSIVE METABOLIC PANEL
ALT: 11 U/L (ref 0–35)
AST: 15 U/L (ref 0–37)
Albumin: 3.5 g/dL (ref 3.5–5.2)
Alkaline Phosphatase: 99 U/L (ref 39–117)
BILIRUBIN TOTAL: 0.2 mg/dL — AB (ref 0.3–1.2)
BUN: 9 mg/dL (ref 6–23)
CO2: 22 mEq/L (ref 19–32)
CREATININE: 0.45 mg/dL — AB (ref 0.50–1.10)
Calcium: 8.8 mg/dL (ref 8.4–10.5)
Chloride: 103 mEq/L (ref 96–112)
GFR calc Af Amer: >90 mL/min (ref >90)
GFR calc non Af Amer: >90 mL/min (ref >90)
Glucose, Bld: 121 mg/dL — ABNORMAL HIGH (ref 70–99)
Potassium: 3.8 mEq/L (ref 3.7–5.3)
Sodium: 140 mEq/L (ref 137–147)
TOTAL PROTEIN: 6.9 g/dL (ref 6.0–8.3)

## 2014-03-10 LAB — URINALYSIS, ROUTINE W REFLEX MICROSCOPIC
Bilirubin Urine: NEGATIVE
Glucose, UA: NEGATIVE mg/dL
KETONES UR: NEGATIVE mg/dL
NITRITE: NEGATIVE
PH: 7 (ref 5.0–8.0)
Protein, ur: 100 mg/dL — AB
Specific Gravity, Urine: 1.009 (ref 1.005–1.030)
Urobilinogen, UA: 0.2 mg/dL (ref 0.0–1.0)

## 2014-03-10 LAB — I-STAT CG4 LACTIC ACID, ED: Lactic Acid, Venous: 1.57 mmol/L (ref 0.5–2.2)

## 2014-03-10 LAB — CBC WITH DIFFERENTIAL/PLATELET
BASOS PCT: 1 % (ref 0–1)
Basophils Absolute: 0 10*3/uL (ref 0.0–0.1)
EOS ABS: 0.5 10*3/uL (ref 0.0–0.7)
EOS PCT: 7 % — AB (ref 0–5)
HEMATOCRIT: 33.9 % — AB (ref 36.0–46.0)
Hemoglobin: 10.5 g/dL — ABNORMAL LOW (ref 12.0–15.0)
Lymphocytes Relative: 41 % (ref 12–46)
Lymphs Abs: 3.1 10*3/uL (ref 0.7–4.0)
MCH: 25.1 pg — AB (ref 26.0–34.0)
MCHC: 31 g/dL (ref 30.0–36.0)
MCV: 80.9 fL (ref 78.0–100.0)
MONO ABS: 0.6 10*3/uL (ref 0.1–1.0)
MONOS PCT: 8 % (ref 3–12)
Neutro Abs: 3.2 10*3/uL (ref 1.7–7.7)
Neutrophils Relative %: 43 % (ref 43–77)
Platelets: 310 10*3/uL (ref 150–400)
RBC: 4.19 MIL/uL (ref 3.87–5.11)
RDW: 16.6 % — ABNORMAL HIGH (ref 11.5–15.5)
WBC: 7.4 10*3/uL (ref 4.0–10.5)

## 2014-03-10 LAB — PROTIME-INR
INR: 1.02 (ref 0.00–1.49)
PROTHROMBIN TIME: 13.2 s (ref 11.6–15.2)

## 2014-03-10 LAB — URINE MICROSCOPIC-ADD ON

## 2014-03-10 LAB — PROCALCITONIN

## 2014-03-10 MED ORDER — SODIUM CHLORIDE 0.9 % IV SOLN: INTRAVENOUS | Status: DC

## 2014-03-10 MED ORDER — RIVAROXABAN 20 MG PO TABS
20.0000 mg | ORAL_TABLET | Freq: Every day | ORAL | Status: DC
  Administered 2014-03-11 – 2014-03-14 (×4): 20 mg via ORAL
  Filled 2014-03-10 (×5): qty 1

## 2014-03-10 MED ORDER — ACETAMINOPHEN 325 MG PO TABS: 650.0000 mg | ORAL_TABLET | Freq: Three times a day (TID) | ORAL | Status: DC | PRN

## 2014-03-10 MED ORDER — FENTANYL CITRATE 0.05 MG/ML IJ SOLN
12.5000 ug | INTRAMUSCULAR | Status: DC | PRN
  Administered 2014-03-10 – 2014-03-11 (×2): 12.5 ug via INTRAVENOUS
  Filled 2014-03-10 (×2): qty 2

## 2014-03-10 MED ORDER — BACLOFEN 20 MG PO TABS
20.0000 mg | ORAL_TABLET | Freq: Four times a day (QID) | ORAL | Status: DC
  Administered 2014-03-11 – 2014-03-15 (×18): 20 mg via ORAL
  Filled 2014-03-10 (×21): qty 1

## 2014-03-10 MED ORDER — LIDOCAINE 5 % EX PTCH
1.0000 | MEDICATED_PATCH | CUTANEOUS | Status: DC
  Administered 2014-03-11 – 2014-03-15 (×5): 1 via TRANSDERMAL
  Filled 2014-03-10 (×7): qty 1

## 2014-03-10 MED ORDER — SODIUM CHLORIDE 0.9 % IV SOLN
INTRAVENOUS | Status: DC
  Administered 2014-03-10: 22:00:00 via INTRAVENOUS

## 2014-03-10 MED ORDER — TIZANIDINE HCL 4 MG PO TABS
4.0000 mg | ORAL_TABLET | Freq: Three times a day (TID) | ORAL | Status: DC
  Administered 2014-03-11 – 2014-03-15 (×14): 4 mg via ORAL
  Filled 2014-03-10 (×16): qty 1

## 2014-03-10 MED ORDER — SODIUM CHLORIDE 0.9 % IV SOLN
1000.0000 mL | Freq: Once | INTRAVENOUS | Status: AC
Start: 2014-03-10 — End: 2014-03-11
  Administered 2014-03-10: 1000 mL via INTRAVENOUS

## 2014-03-10 MED ORDER — CLONAZEPAM 0.5 MG PO TABS
1.0000 mg | ORAL_TABLET | Freq: Four times a day (QID) | ORAL | Status: DC
  Administered 2014-03-11 – 2014-03-15 (×17): 1 mg via ORAL
  Filled 2014-03-10 (×17): qty 2

## 2014-03-10 MED ORDER — PIPERACILLIN-TAZOBACTAM 3.375 G IVPB
3.3750 g | Freq: Three times a day (TID) | INTRAVENOUS | Status: DC
  Administered 2014-03-11 – 2014-03-13 (×8): 3.375 g via INTRAVENOUS
  Filled 2014-03-10 (×10): qty 50

## 2014-03-10 MED ORDER — ARIPIPRAZOLE 5 MG PO TABS
5.0000 mg | ORAL_TABLET | Freq: Every morning | ORAL | Status: DC
  Administered 2014-03-11 – 2014-03-15 (×5): 5 mg via ORAL
  Filled 2014-03-10 (×5): qty 1

## 2014-03-10 MED ORDER — SODIUM CHLORIDE 0.9 % IV SOLN
1000.0000 mL | Freq: Once | INTRAVENOUS | Status: AC
  Administered 2014-03-10: 1000 mL via INTRAVENOUS

## 2014-03-10 MED ORDER — SODIUM CHLORIDE 0.9 % IV SOLN
1000.0000 mL | INTRAVENOUS | Status: DC
  Administered 2014-03-11: 1000 mL via INTRAVENOUS

## 2014-03-10 MED ORDER — DIAZEPAM 5 MG PO TABS: 5.0000 mg | ORAL_TABLET | Freq: Four times a day (QID) | ORAL | Status: DC | PRN

## 2014-03-10 MED ORDER — LORATADINE 10 MG PO TABS
10.0000 mg | ORAL_TABLET | Freq: Every day | ORAL | Status: DC | PRN
  Administered 2014-03-12: 10 mg via ORAL
  Filled 2014-03-10: qty 1

## 2014-03-10 MED ORDER — SODIUM CHLORIDE 0.9 % IJ SOLN
3.0000 mL | Freq: Two times a day (BID) | INTRAMUSCULAR | Status: DC
  Administered 2014-03-11 – 2014-03-15 (×6): 3 mL via INTRAVENOUS

## 2014-03-10 MED ORDER — TIZANIDINE HCL 4 MG PO CAPS: 4.0000 mg | ORAL_CAPSULE | Freq: Three times a day (TID) | ORAL | Status: DC

## 2014-03-10 MED ORDER — PIPERACILLIN-TAZOBACTAM 3.375 G IVPB 30 MIN
3.3750 g | Freq: Once | INTRAVENOUS | Status: AC
  Administered 2014-03-10: 3.375 g via INTRAVENOUS
  Filled 2014-03-10: qty 50

## 2014-03-10 MED ORDER — VANCOMYCIN HCL IN DEXTROSE 750-5 MG/150ML-% IV SOLN
750.0000 mg | Freq: Two times a day (BID) | INTRAVENOUS | Status: DC
  Administered 2014-03-11 – 2014-03-13 (×5): 750 mg via INTRAVENOUS
  Filled 2014-03-10 (×8): qty 150

## 2014-03-10 MED ORDER — DOCUSATE SODIUM 100 MG PO CAPS
100.0000 mg | ORAL_CAPSULE | Freq: Two times a day (BID) | ORAL | Status: DC
  Administered 2014-03-11 – 2014-03-15 (×5): 100 mg via ORAL
  Filled 2014-03-10 (×14): qty 1

## 2014-03-10 MED ORDER — TOPIRAMATE 25 MG PO TABS
100.0000 mg | ORAL_TABLET | Freq: Every day | ORAL | Status: DC
  Administered 2014-03-11 – 2014-03-14 (×5): 100 mg via ORAL
  Filled 2014-03-10 (×6): qty 4

## 2014-03-10 MED ORDER — SERTRALINE HCL 50 MG PO TABS
200.0000 mg | ORAL_TABLET | Freq: Every day | ORAL | Status: DC
  Administered 2014-03-11 – 2014-03-14 (×5): 200 mg via ORAL
  Filled 2014-03-10 (×6): qty 4

## 2014-03-10 MED ORDER — VANCOMYCIN HCL IN DEXTROSE 1-5 GM/200ML-% IV SOLN
1000.0000 mg | Freq: Once | INTRAVENOUS | Status: AC
  Administered 2014-03-10: 1000 mg via INTRAVENOUS
  Filled 2014-03-10: qty 200

## 2014-03-10 MED ORDER — LAMOTRIGINE 200 MG PO TABS
200.0000 mg | ORAL_TABLET | Freq: Every day | ORAL | Status: DC
  Administered 2014-03-11 – 2014-03-15 (×5): 200 mg via ORAL
  Filled 2014-03-10 (×5): qty 1

## 2014-03-10 MED ORDER — DIPHENHYDRAMINE HCL 25 MG PO CAPS: 25.0000 mg | ORAL_CAPSULE | Freq: Four times a day (QID) | ORAL | Status: DC | PRN

## 2014-03-10 MED ORDER — POLYETHYLENE GLYCOL 3350 17 G PO PACK
17.0000 g | PACK | Freq: Every day | ORAL | Status: DC
  Administered 2014-03-11: 17 g via ORAL
  Filled 2014-03-10 (×6): qty 1

## 2014-03-10 MED ORDER — HYDROCODONE-ACETAMINOPHEN 5-325 MG PO TABS
1.0000 | ORAL_TABLET | Freq: Four times a day (QID) | ORAL | Status: DC | PRN
  Administered 2014-03-11 – 2014-03-15 (×8): 2 via ORAL
  Administered 2014-03-15: 1 via ORAL
  Filled 2014-03-10 (×5): qty 2
  Filled 2014-03-10: qty 1
  Filled 2014-03-10 (×3): qty 2

## 2014-03-10 MED ORDER — HYDROMORPHONE HCL PF 1 MG/ML IJ SOLN
0.5000 mg | Freq: Once | INTRAMUSCULAR | Status: AC
  Administered 2014-03-10: 0.5 mg via INTRAVENOUS
  Filled 2014-03-10: qty 1

## 2014-03-10 MED ORDER — ONDANSETRON HCL 4 MG/2ML IJ SOLN
4.0000 mg | Freq: Once | INTRAMUSCULAR | Status: AC
  Administered 2014-03-10: 4 mg via INTRAVENOUS
  Filled 2014-03-10: qty 2

## 2014-03-10 MED ORDER — QUETIAPINE FUMARATE 25 MG PO TABS
100.0000 mg | ORAL_TABLET | Freq: Every day | ORAL | Status: DC
  Administered 2014-03-11 – 2014-03-14 (×5): 100 mg via ORAL
  Filled 2014-03-10 (×6): qty 4

## 2014-03-10 NOTE — Progress Notes (Signed)
Patient ID: [name] SlickerJudith [name]    DOB: December 03, 1975, 38 y.o.   MRN: [id number] --- Subjective:  [name] ClosJudith is a 38 y.o.female with h/o spastic CP, neurogenic bladder with indwelling suprapubic catheter, recurrent PE on xarelto who is seen in the nursing home at patient's request due to uncontrolled abdominal pain.  - abdominal pain: patient had baclofen pump changed out on 03/07/14. Since yesterday, she has had increased abdominal pain both at the surgical site as well as around the suprapubic area. She rates the pain 9/10, sharp. She has been getting percocet every 4 hrs which is not relieving pain. She denies any nausea, vomiting. She has been eating and drinking.    She had a urine culture drawn on 02/28/14 for unknown reason, which showed 20,000 colonies pf serratia, pseudomonas and enterococus. Serratia resistant to nitrofurantoin, pseudomonas sensitive only to ceftadizine, cefepime and enteroccus sensitive to amp, nitrofurantoin and vanc. She was asymptomatic at the time and therefore was not treated.   ROS: see HPI Past Medical History: reviewed and updated medications and allergies. Social History: Tobacco: none  Objective: BP: 96/71, HR: 83, TT: 101.3, O2: 98% Physical Examination:   General appearance - alert, laying in bed Chest - anterior exam: coarse bilaterally, but likely positional Heart - normal rate, regular rhythm, normal S1, S2, no murmurs Abdomen - tender around surgical site and tender in suprapubic area. No rebound. Baclofen pump palpable in right upper quadrant Skin - vesicular erythematous rash around incision, steri strips in place, mild warmth to touch Extremities - no edema

## 2014-03-10 NOTE — ED Notes (Signed)
38 yo [facility] with complaints of subrapubic pain and Fever of 101. [facility] and Bacofen pump placed on Monday. The site for the bacofen pump is red and warm to the touch. The Suprpubic cath is draining serous sang fluid. Foley is intact draining clear yellow. Received Tylenol at 3pm.   Vitals 100/80 HR 60

## 2014-03-10 NOTE — ED Notes (Signed)
Dr. Mammie LorenzoMacIntrye from family practice at bedside

## 2014-03-10 NOTE — Progress Notes (Signed)
ANTIBIOTIC CONSULT NOTE - INITIAL  Pharmacy Consult for Vancomycin and Zosyn Indication: sepsis  Allergies  Allergen Reactions  . Morphine Dermatitis    Skin turned red    Patient Measurements: Height: 4\' 11"  (149.9 cm) Weight: 115 lb (52.164 kg) IBW/kg (Calculated) : 43.2  Vital Signs: Temp: 98.8 F (37.1 C) (04/09 1822) Temp src: Oral (04/09 1822) BP: 117/80 mmHg (04/09 1822) Pulse Rate: 86 (04/09 1822) Intake/Output from previous day:   Intake/Output from this shift:    Labs: No results found for this basename: WBC, HGB, PLT, LABCREA, CREATININE,  in the last 72 hours Estimated Creatinine Clearance: 69.1 ml/min (by C-G formula based on Cr of 0.49). No results found for this basename: VANCOTROUGH, Leodis Binet, VANCORANDOM, GENTTROUGH, GENTPEAK, GENTRANDOM, TOBRATROUGH, TOBRAPEAK, TOBRARND, AMIKACINPEAK, AMIKACINTROU, AMIKACIN,  in the last 72 hours   Microbiology: Recent Results (from the past 720 hour(s))  SURGICAL PCR SCREEN     Status: None   Collection Time    03/07/14  8:20 AM      Result Value Ref Range Status   MRSA, PCR NEGATIVE  NEGATIVE Final   Staphylococcus aureus NEGATIVE  NEGATIVE Final   Comment:            The Xpert SA Assay (FDA     approved for NASAL specimens     in patients over 90 years of age),     is one component of     a comprehensive surveillance     program.  Test performance has     been validated by [employer] for patients greater     than or equal to 62 year old.     It is not intended     to diagnose infection nor to     guide or monitor treatment.  MRSA PCR SCREENING     Status: None   Collection Time    03/07/14  3:29 PM      Result Value Ref Range Status   MRSA by PCR NEGATIVE  NEGATIVE Final   Comment:            The GeneXpert MRSA Assay (FDA     approved for NASAL specimens     only), is one component of a     comprehensive MRSA colonization     surveillance program. It is not     intended to diagnose MRSA   infection nor to guide or     monitor treatment for     MRSA infections.    Medical History: Past Medical History  Diagnosis Date  . Cerebral palsy   . Pulmonary embolism     Lifetime Coumadin  . Cerebral palsy     Spastic Cerebral palsy, mentally intact  . Contracture, joint, multiple sites     Electric wheelchair, uses left hand to operate chair.   [name] Hypertension   . OCD (obsessive compulsive disorder)   . Depression   . Migraine   . [name]   . Dysarthria   . Endometriosis   . History of recurrent UTIs   . GERD (gastroesophageal reflux disease)   . Pulmonary embolism 2011, 01/2011    Will be on lifetime coumadin  . Anemia   . Abdominal pain 01/02/2012    Overview:  Overview:  Per Previous pcp note- Dr. Ta--Pt has history of endometriosis and had DUB in 2011.  She She had u/s that showed normal endometrial stripe.  She had endometrial bx that  was wnl.  She has a lot of abd cramping every month.  This cramping was sometimes not relieved with hydrocodone.  [facility] to help endometriosis and abd cramping.  [name]  . Macroscopic hematuria 03/10/2013    status post replacement of suprapubic tube 03/04/2013   . HYPERTENSION, BENIGN 01/31/2010    Qualifier: Diagnosis of  By: Gala RomneyBensimhon, MD, Trixie DredgeFACC, Daniel R     Medications:  See electronic med rec  Assessment: 38 y.o. female presents from NH with c/o abd pain, fever up to 102. Noted h/o spastic CP, neurogenic bladder with indwelling suprapubic catheter, recurrent PE (on Xarelto). Baclofen pump changed out 03/07/14. Recent urine cx 02/28/14 shows 20kcol serratia, pseudomonas, enterococcus (Serratia resistant to nitrofurantoin, pseudomonas sensitive only to ceftadizine, cefepime and enteroccus sensitive to amp, nitrofurantoin and vanc). No treatment begun as pt asymptomatic. To begin broad spectrum antibiotics for sepsis.  SCr may not be reflective of renal function in pt with  cerebral palsy due to muscle wasting.  Goal of Therapy:  Vancomycin trough level 15-20 mcg/ml  Plan:  1. Zosyn 3.375gm IV q8h. First dose over 30 minutes and subsequent doses over 4 hours. 2. Vancomycin 1gm now then 750mg  IV q12h 3. Will f/u micro data, pt's clinical condition, UOP, renal function 4. Will check vanc trough at [facility]  Christoper Fabianaron Kenlea Woodell, PharmD, BCPS Clinical pharmacist, pager [phone] 03/10/2014,6:30 PM

## 2014-03-10 NOTE — H&P (Signed)
Family Medicine Teaching [facility] Admission History and Physical Service Pager: [phone]  Patient name: [name] [name] Medical record number: [id number] Date of birth: October 29, 1976 Age: 38 y.o. Gender: female  Primary Care Provider: [name], MD Consultants: neurosurgery Code Status: full code (discussed with patient upon admission)  Chief Complaint: abdominal pain and fever  Assessment and Plan: [name] is a 38 y.o. female presenting with abdominal pain and fever, several days post-op for baclofen pump replacement. Ddx includes infected hardware, surgical site infection, UTI. PMH is significant for spastic CP (mentally intact), neurogenic bladder with indwelling suprapubic catheter, recurrent PE on Xarelto.  # Abdominal pain: etiology unclear but most likely related to UTI vs. Routine post-operative pain. Surgical site does not appear infected, instead appears to be reaction to steri strips. Pt seen by neurosurgery in the ER who reportedly did not feel this was a surgical site infection. May need to formally consult general surgery vs. Neurosurgery since no note written in chart. -if abdominal pain worsening, consider imaging of abdomen -IV fentanyl prn pain (has morphine allergy), benadryl prn itching -consider removal of steri strips and/or wound care consult -f/u urine culture & blood culture, cover broadly with vanc/zosyn for now. Broaden if clinically worsens.  # Psych: hx of depression & OCD:  -continue home abilify, klonopin, lamictal, seroquel, zoloft, topamax  # CP/spasticity/chronic pain:  -continue home baclofen, hydrocodone prn (with prn fentanyl as well), tizanidine  # Recurrent PE: continue xarelto  FEN/GI: soft diet, NS @ 125 cc/hr,  Prophylaxis: on xarelto  Disposition: admit to telemetry  History of Present Illness: [name] [name] is a 38 y.o. female presenting with abdominal pain and fever after having a baclofen pump replacement three days ago.   History is limited by patient's speech being very difficult to understand.   Patient states that since Monday she has been having abdominal pain. According to nursing home notes, patient has not had any vomiting or nausea and has been eating and drinking well. She has had pain in her mid-abdomen, medial to the pump insertion site. Has also had pain near her suprapubic catheter. She had a fever to 101.3 earlier today at [facility] nursing home, prompting transport to the ER for further management and admission to the hospital. Of note patient endorses significant pruritis overlying her pump insertion site.  Review Of Systems: Per HPI with the following additions: n/a. Otherwise unremarkable.   Patient Active Problem List   Diagnosis Date Noted  . Sepsis 03/10/2014  . Baclofen pump failure 03/07/2014  . Spasticity 01/05/2014  . Nursing home resident 09/16/2013  . Dental caries 07/18/2013  . Multiple thyroid nodules 05/20/2013  . Osteoarthrosis 04/07/2013  . Anemia 04/07/2013  . [name]   . Dysarthria   . GERD (gastroesophageal reflux disease)   . Gross hematuria 03/12/2013  . Neurogenic bladder 02/05/2013  . Anxiety state 10/21/2012  . History of brain disorder 10/21/2012  . Healed or old pulmonary embolism 10/21/2012  . Chronic pain syndrome 06/17/2012  . Abdominal pain 01/02/2012  . Cerebral palsied 10/25/2011  . Recurrent pulmonary embolism 05/03/2011  . PE (pulmonary embolism) 05/03/2011  . Chronic suprapubic catheter 03/15/2011  . Status of artificial opening of urinary tract 03/15/2011  . Retention of urine 03/15/2011  . Seborrheic keratosis 01/18/2011  . Long term (current) use of anticoagulants 01/12/2011  . Disease of female genital organs 10/24/2010  . WRIST PAIN, LEFT 03/20/2010  . HYPERTENSION, BENIGN 01/31/2010  . CP (cerebral palsy), spastic 12/27/2008  .  Contracted, joint, multiple sites 12/27/2008  . DEPRESSION, MAJOR, RECURRENT  01/29/2007  . OBSESSIVE COMPUL. DISORDER 01/29/2007  . Infantile cerebral palsy 01/29/2007  . Anancastic neurosis 01/29/2007   Past Medical History: Past Medical History  Diagnosis Date  . Cerebral palsy   . Pulmonary embolism     Lifetime Coumadin  . Cerebral palsy     Spastic Cerebral palsy, mentally intact  . Contracture, joint, multiple sites     Electric wheelchair, uses left hand to operate chair.   [name] Kitchen Hypertension   . OCD (obsessive compulsive disorder)   . Depression   . Migraine   . [name]   . Dysarthria   . Endometriosis   . History of recurrent UTIs   . GERD (gastroesophageal reflux disease)   . Pulmonary embolism 2011, 01/2011    Will be on lifetime coumadin  . Anemia   . Abdominal pain 01/02/2012    Overview:  Overview:  Per Previous pcp note- Dr. Ta--Pt has history of endometriosis and had DUB in 2011.  She She had u/s that showed normal endometrial stripe.  She had endometrial bx that was wnl.  She has a lot of abd cramping every month.  This cramping was sometimes not relieved with hydrocodone.  She was referred to [facility] for IUD placement to help endometriosis and abd cramping.  [name]  . Macroscopic hematuria 03/10/2013    status post replacement of suprapubic tube 03/04/2013   . HYPERTENSION, BENIGN 01/31/2010    Qualifier: Diagnosis of  By: [name], MD, [name]    Past Surgical History: Past Surgical History  Procedure Laterality Date  . Colposcopy  06/2000  . Cesarean section      x 2  . Tubal ligation  2003  . Carpal tunnel release  08/2008    Dr Teressa Senter  . Wrist surgery  06/2010    Dr Dierdre Searles, hand surgeon, [name]  . Baclofen pump refill      x 3 times  . Appendectomy    . Intrauterine device insertion  04/30/11    Inserted by [facility] for endometriosis  . Laparoscopically assisted vag hysterectomy  10/27/2012  . Multiple extractions with alveoloplasty Bilateral 07/19/2013    Procedure: EXTRACTIONS #4, 1,61,09,60;   Surgeon: Francene Finders, DDS;  Location: [facility] OR;  Service: Oral Surgery;  Laterality: Bilateral;   Social History: History  Substance Use Topics  . Smoking status: Never Smoker   . Smokeless tobacco: Never Used  . Alcohol Use: 0.6 oz/week    1 Glasses of wine per week     Comment: Drinks alcohol once a month.   Additional social history: has two children, oldest child is going into the [employer]	[place] this year  Please also refer to relevant sections of EMR.  Family History: Family History  Problem Relation Age of Onset  . Asthma Father   . Colon cancer Maternal Grandmother     Died in her 29's  . Cancer Paternal Grandmother   . Heart attack Maternal Grandfather     Died in his 34's  . Breast cancer Paternal Grandmother     Died in her 66's  . Alzheimer's disease Paternal Grandfather     Died in his 25's   Allergies and Medications: Allergies  Allergen Reactions  . Morphine Dermatitis    Skin turned red   No current facility-administered medications on file prior to encounter.   Current Outpatient Prescriptions on File Prior to Encounter  Medication  Sig Dispense Refill  . acetaminophen (TYLENOL) 325 MG tablet Take 2 tablets (650 mg total) by mouth every 8 (eight) hours.  90 tablet  0  . acetic acid (VOSOL) 2 % otic solution Place 4 drops into both ears once a week.      . ARIPiprazole (ABILIFY) 5 MG tablet Take 5 mg by mouth every morning.       . bacitracin-polymyxin b (POLYSPORIN) ointment Apply topically 2 (two) times daily as needed (at cath site for redness, discharge).      . baclofen (LIORESAL) 10 MG tablet Take 2 tablets (20 mg total) by mouth 4 (four) times daily.  30 each  0  . clonazePAM (KLONOPIN) 1 MG tablet Take 1 tablet (1 mg total) by mouth 4 (four) times daily.  120 tablet  2  . diazepam (VALIUM) 5 MG tablet Take 1 tablet (5 mg total) by mouth every 6 (six) hours as needed for muscle spasms.  1 tablet  0  . docusate sodium (COLACE) 100 MG capsule Take  100 mg by mouth 2 (two) times daily.      [name] Kitchen. HYDROcodone-acetaminophen (NORCO/VICODIN) 5-325 MG per tablet Take 1-2 tablets by mouth every 6 (six) hours as needed for severe pain.  30 tablet  0  . ibuprofen (ADVIL,MOTRIN) 200 MG tablet Take 400 mg by mouth every 8 (eight) hours as needed for pain.      [name] Kitchen. lamoTRIgine (LAMICTAL) 200 MG tablet Take 200 mg by mouth daily.       [name] Kitchen. lidocaine (LIDODERM) 5 % Place 1 patch onto the skin daily. Remove & Discard patch within 12 hours or as directed by MD      . loratadine (CLARITIN) 10 MG tablet Take 10 mg by mouth daily as needed for allergies.      . Multiple Vitamin (MULTIVITAMIN WITH MINERALS) TABS Take 1 tablet by mouth every evening.       . polyethylene glycol (MIRALAX / GLYCOLAX) packet Take 17 g by mouth daily.      . SUMAtriptan (IMITREX) 100 MG tablet Take 100 mg by mouth every 2 (two) hours as needed for migraine.       [name] Kitchen. tiZANidine (ZANAFLEX) 4 MG capsule Take 1 capsule (4 mg total) by mouth 3 (three) times daily.  30 capsule  0  . topiramate (TOPAMAX) 100 MG tablet Take 100 mg by mouth at bedtime.       [name] Kitchen. QUEtiapine (SEROQUEL) 100 MG tablet Take 100 mg by mouth at bedtime.      . Rivaroxaban (XARELTO) 20 MG TABS Take 1 tablet (20 mg total) by mouth daily with supper.  30 tablet    . sertraline (ZOLOFT) 100 MG tablet Take 200 mg by mouth at bedtime.         Objective: BP 135/70  Pulse 94  Temp(Src) 98.8 F (37.1 C) (Oral)  Resp 17  Ht 4\' 11"  (1.499 m)  Wt 115 lb (52.164 kg)  BMI 23.21 kg/m2  SpO2 96%  LMP 04/05/2011 Exam: General: NAD, contracted lying in bed appears in pain HEENT: NCAT, MMM, drooling Cardiovascular: RRR, no murmurs Respiratory: bilateral breath sounds present, transmitted upper airway sounds auscultated Abdomen: approximately 10cm surgical incision present over right abdomen. Erythema and weeping/vesicles present surrounding steristrips which cover the incision. No would dehiscence or drainage. Suprapubic catheter  site with some mild yellowish discharge but no surrounding erythema. Abdomen TTP, especially in mid abdomen. Extremities: warm and well perfused Skin: see above Neuro: contracted. Speech difficult  to understand but fluent. Mentation in tact.  Labs and Imaging: CBC BMET   Recent Labs Lab 03/10/14 1807  WBC 7.4  HGB 10.5*  HCT 33.9*  PLT 310    Recent Labs Lab 03/10/14 1807  NA 140  K 3.8  CL 103  CO2 22  BUN 9  CREATININE 0.45*  GLUCOSE 121*  CALCIUM 8.8     Lactate 1.57 Urinalysis    Component Value Date/Time   COLORURINE YELLOW 03/10/2014 1854   APPEARANCEUR CLOUDY* 03/10/2014 1854   LABSPEC 1.009 03/10/2014 1854   PHURINE 7.0 03/10/2014 1854   GLUCOSEU NEGATIVE 03/10/2014 1854   HGBUR LARGE* 03/10/2014 1854   HGBUR large 10/03/2010 1530   BILIRUBINUR NEGATIVE 03/10/2014 1854   BILIRUBINUR neg 03/15/2011 1719   KETONESUR NEGATIVE 03/10/2014 1854   PROTEINUR 100* 03/10/2014 1854   PROTEINUR neg 03/15/2011 1719   UROBILINOGEN 0.2 03/10/2014 1854   UROBILINOGEN 0.2 03/15/2011 1719   NITRITE NEGATIVE 03/10/2014 1854   NITRITE neg 03/15/2011 1719   LEUKOCYTESUR MODERATE* 03/10/2014 1854  Urine micro: many squams, 3-6 WBC, 0-2 WBC, rare bacteria, amorphous urates/phosphates CXR clear    Latrelle Dodrill, MD 03/10/2014, 9:29 PM PGY-2, [place] [facility] FPTS Intern pager: [phone], text pages welcome

## 2014-03-10 NOTE — ED Notes (Signed)
Suprapubic Cath draining serous sang fluid.

## 2014-03-10 NOTE — Assessment & Plan Note (Addendum)
Possible UTI.  Patient had recent Urine culture with the following results:  20,000 colonies pf serratia, pseudomonas and enterococus. Serratia resistant to nitrofurantoin, pseudomonas sensitive only to ceftadizine, cefepime and resistant to zosyn, imipenem, gent, cipro, and levaquin and enteroccus sensitive to amp, nitrofurantoin and vanc. She was asymptomatic at the time and therefore was not treated.  Will likely need empiric treatment Suprapubic catheter changed in nursing home prior to transfer to ED.

## 2014-03-10 NOTE — ED Notes (Signed)
Report attempted x2

## 2014-03-10 NOTE — ED Notes (Signed)
Report attempted x 1

## 2014-03-10 NOTE — Assessment & Plan Note (Signed)
Worsening abdominal pain located at surgical site as well as suprapubic area, associated with fever.  Differential includes UTI vs infected hardware with newly inserted pump.  - will transfer patient to the ED for evaluation and likely admission to teaching service. Likely will need urine culture, blood culture, IV antibiotics and pain med and surgical consult.

## 2014-03-10 NOTE — ED Provider Notes (Signed)
CSN: [id number]     Arrival date & time 03/10/14  1757 History   First MD Initiated Contact with Patient 03/10/14 1801     Chief Complaint  Patient presents with  . Code Sepsis     (Consider location/radiation/quality/duration/timing/severity/associated sxs/prior Treatment) HPI Comments: Patient from nursing home with pain in her baclofen pump site. This was replaced 3 days ago by Dr. Lovell Sheehan of neurosurgery. Today she developed pain in rash over the incision. She's had fever up to 102 and pain along her suprapubic catheter site as well. She is currently not on antibiotics. Her pain is not controlled with Percocet. She has not had any vomiting. She denies any chest pain or shortness of breath.  The history is provided by the patient.    Past Medical History  Diagnosis Date  . Cerebral palsy   . Pulmonary embolism     Lifetime Coumadin  . Cerebral palsy     Spastic Cerebral palsy, mentally intact  . Contracture, joint, multiple sites     Electric wheelchair, uses left hand to operate chair.   [name] Kitchen Hypertension   . OCD (obsessive compulsive disorder)   . Depression   . Migraine   . [name]   . Dysarthria   . Endometriosis   . History of recurrent UTIs   . GERD (gastroesophageal reflux disease)   . Pulmonary embolism 2011, 01/2011    Will be on lifetime coumadin  . Anemia   . Abdominal pain 01/02/2012    Overview:  Overview:  Per Previous pcp note- Dr. Ta--Pt has history of endometriosis and had DUB in 2011.  She She had u/s that showed normal endometrial stripe.  She had endometrial bx that was wnl.  She has a lot of abd cramping every month.  This cramping was sometimes not relieved with hydrocodone.  She was referred to [facility] for IUD placement to help endometriosis and abd cramping.  [name]  . Macroscopic hematuria 03/10/2013    status post replacement of suprapubic tube 03/04/2013   . HYPERTENSION, BENIGN 01/31/2010    Qualifier: Diagnosis of  By: [name],  MD, [name]    Past Surgical History  Procedure Laterality Date  . Colposcopy  06/2000  . Cesarean section      x 2  . Tubal ligation  2003  . Carpal tunnel release  08/2008    Dr Teressa Senter  . Wrist surgery  06/2010    Dr Dierdre Searles, hand surgeon, [name]  . Baclofen pump refill      x 3 times  . Appendectomy    . Intrauterine device insertion  04/30/11    Inserted by [facility] GYN for endometriosis  . Laparoscopically assisted vag hysterectomy  10/27/2012  . Multiple extractions with alveoloplasty Bilateral 07/19/2013    Procedure: EXTRACTIONS #4, 6,96,29,52;  Surgeon: Francene Finders, DDS;  Location: [facility] OR;  Service: Oral Surgery;  Laterality: Bilateral;   Family History  Problem Relation Age of Onset  . Asthma Father   . Colon cancer Maternal Grandmother     Died in her 53's  . Cancer Paternal Grandmother   . Heart attack Maternal Grandfather     Died in his 42's  . Breast cancer Paternal Grandmother     Died in her 66's  . Alzheimer's disease Paternal Grandfather     Died in his 40's   History  Substance Use Topics  . Smoking status: Never Smoker   . Smokeless tobacco: Never Used  .  Alcohol Use: 0.6 oz/week    1 Glasses of wine per week     Comment: Drinks alcohol once a month.   OB History   Grav Para Term Preterm Abortions TAB SAB Ect Mult Living                 Review of Systems  Constitutional: Positive for fever, activity change and appetite change. Negative for fatigue.  HENT: Negative for congestion and rhinorrhea.   Respiratory: Negative for cough, chest tightness and shortness of breath.   Cardiovascular: Positive for chest pain.  Gastrointestinal: Positive for nausea and abdominal pain. Negative for vomiting.  Genitourinary: Positive for dysuria. Negative for vaginal bleeding and vaginal discharge.  Skin: Negative for rash.  Neurological: Negative for dizziness, weakness and headaches.  A complete 10 system review of systems was obtained and all  systems are negative except as noted in the HPI and PMH.      Allergies  Morphine  Home Medications   No current outpatient prescriptions on file. BP 119/46  Pulse 88  Temp(Src) 98.6 F (37 C) (Oral)  Resp 18  Ht 4\' 11"  (1.499 m)  Wt 123 lb 7.3 oz (56 kg)  BMI 24.92 kg/m2  SpO2 95%  LMP 04/05/2011 Physical Exam  Constitutional: She is oriented to person, place, and time. She appears well-developed and well-nourished. No distress.  HENT:  Head: Normocephalic and atraumatic.  Mouth/Throat: Oropharynx is clear and moist. No oropharyngeal exudate.  Eyes: Conjunctivae and EOM are normal. Pupils are equal, round, and reactive to light.  Neck: Normal range of motion. Neck supple.  Cardiovascular: Normal rate, regular rhythm and normal heart sounds.   No murmur heard. Pulmonary/Chest: Effort normal and breath sounds normal. No respiratory distress.  Abdominal: Soft. There is tenderness.  Tenderness to palpation right upper quadrant over baclofen pump. Surrounding erythematous vesicular rash on her Steri-Strips. No fluctuance.  Suprapubic catheter in place with clear drainage  Musculoskeletal: Normal range of motion. She exhibits no edema and no tenderness.  Neurological: She is alert and oriented to person, place, and time. No cranial nerve deficit. She exhibits normal muscle tone. Coordination normal.  Skin: Skin is warm.    ED Course  Procedures (including critical care time) Labs Review Labs Reviewed  CBC WITH DIFFERENTIAL - Abnormal; Notable for the following:    Hemoglobin 10.5 (*)    HCT 33.9 (*)    MCH 25.1 (*)    RDW 16.6 (*)    Eosinophils Relative 7 (*)    All other components within normal limits  COMPREHENSIVE METABOLIC PANEL - Abnormal; Notable for the following:    Glucose, Bld 121 (*)    Creatinine, Ser 0.45 (*)    Total Bilirubin 0.2 (*)    All other components within normal limits  URINALYSIS, ROUTINE W REFLEX MICROSCOPIC - Abnormal; Notable for the  following:    APPearance CLOUDY (*)    Hgb urine dipstick LARGE (*)    Protein, ur 100 (*)    Leukocytes, UA MODERATE (*)    All other components within normal limits  URINE MICROSCOPIC-ADD ON - Abnormal; Notable for the following:    Squamous Epithelial / LPF MANY (*)    All other components within normal limits  CULTURE, BLOOD (ROUTINE X 2)  CULTURE, BLOOD (ROUTINE X 2)  URINE CULTURE  PROTIME-INR  PROCALCITONIN  BASIC METABOLIC PANEL  CBC  I-STAT CG4 LACTIC ACID, ED   Imaging Review Dg Chest Port 1 View  03/10/2014  CLINICAL DATA:  Chest pain.  EXAM: PORTABLE CHEST - 1 VIEW  COMPARISON:  07/19/2013  FINDINGS: The cardiac silhouette, mediastinal and hilar contours are within normal limits and stable. The lungs are clear. The bony thorax is intact. Thoracolumbar scoliosis appears stable.  IMPRESSION: No acute cardiopulmonary findings.   Electronically Signed   By: Loralie ChampagneMark  Gallerani M.D.   On: 03/10/2014 18:29     EKG Interpretation None      MDM   Final diagnoses:  Sepsis  Urinary tract infection   Patient with fever, worsening abdominal pain after baclofen place 2 days ago. She is febrile.  CODE sepsis was called on arrival. Blood cultures obtained, broad spectrum antibiotic started.  Discussed with Dr. Gerlene FeeKritzer of neurosurgery who feels patient's source is not her wound it is too soon since her surgery.  Patient's urine is not overly impressive for infection. She has leukocytes and white blood cells. She had a culture ago that grew Pseudomonas and Serratia. But she did not have any urinary symptoms.  However her urine is most likely the source of her fever and pain. Her baclofen pump site appears to be healing well. She'll be admitted for IV antibiotics and IV fluids. Will hold imaging of the abdomen at this point.   Glynn OctaveStephen Deval Mroczka, MD 03/11/14 [phone]

## 2014-03-11 DIAGNOSIS — N390 Urinary tract infection, site not specified: Principal | ICD-10-CM

## 2014-03-11 DIAGNOSIS — I10 Essential (primary) hypertension: Secondary | ICD-10-CM

## 2014-03-11 LAB — BASIC METABOLIC PANEL
BUN: 7 mg/dL (ref 6–23)
CO2: 22 meq/L (ref 19–32)
Calcium: 8.3 mg/dL — ABNORMAL LOW (ref 8.4–10.5)
Chloride: 107 mEq/L (ref 96–112)
Creatinine, Ser: 0.48 mg/dL — ABNORMAL LOW (ref 0.50–1.10)
GFR calc Af Amer: >90 mL/min (ref >90)
Glucose, Bld: 84 mg/dL (ref 70–99)
Potassium: 4.1 mEq/L (ref 3.7–5.3)
SODIUM: 143 meq/L (ref 137–147)

## 2014-03-11 LAB — CBC
HCT: 32 % — ABNORMAL LOW (ref 36.0–46.0)
Hemoglobin: 10.1 g/dL — ABNORMAL LOW (ref 12.0–15.0)
MCH: 25.6 pg — ABNORMAL LOW (ref 26.0–34.0)
MCHC: 31.6 g/dL (ref 30.0–36.0)
MCV: 81.2 fL (ref 78.0–100.0)
PLATELETS: 312 10*3/uL (ref 150–400)
RBC: 3.94 MIL/uL (ref 3.87–5.11)
RDW: 16.5 % — ABNORMAL HIGH (ref 11.5–15.5)
WBC: 7.4 10*3/uL (ref 4.0–10.5)

## 2014-03-11 MED ORDER — SODIUM CHLORIDE 0.9 % IV SOLN
1000.0000 mL | INTRAVENOUS | Status: DC
  Administered 2014-03-11 – 2014-03-12 (×2): 1000 mL via INTRAVENOUS

## 2014-03-11 MED ORDER — FENTANYL CITRATE 0.05 MG/ML IJ SOLN
50.0000 ug | INTRAMUSCULAR | Status: DC | PRN
  Administered 2014-03-11 – 2014-03-12 (×7): 50 ug via INTRAVENOUS
  Administered 2014-03-12: 17:00:00 via INTRAVENOUS
  Administered 2014-03-12 – 2014-03-14 (×11): 50 ug via INTRAVENOUS
  Filled 2014-03-11 (×18): qty 2

## 2014-03-11 MED ORDER — TRIAMCINOLONE ACETONIDE 0.5 % EX CREA
TOPICAL_CREAM | Freq: Three times a day (TID) | CUTANEOUS | Status: DC
  Administered 2014-03-11 – 2014-03-15 (×12): via TOPICAL
  Filled 2014-03-11: qty 15

## 2014-03-11 NOTE — Progress Notes (Signed)
Interim Progress Note:   S: She continues to endorse suprapubic pain, but is unable to characterize the pain. Reports this began after receiving bladder botox injection this past Friday   O:  BP 119/46  Pulse 88  Temp(Src) 98.6 F (37 C) (Oral)  Resp 18  Ht 4\' 11"  (1.499 m)  Wt 123 lb 7.3 oz (56 kg)  BMI 24.92 kg/m2  SpO2 95%  LMP 04/05/2011 General: alert, cooperative and mild distress Abdomen: moderate tenderness in the suprapubic area; voluntary guarding; no rebounding.  A/P  Suprapubic pain - Bladder scan: no signs of retention - Etiology uncertain: possible due to botox: muscle spasm ?  - Lactic acid & Procalcitonin wnl, UA doesn't appear to be UTI, WBC wnl - Inc Fentanyl dose to 50mcg q 1 - Will discuss imaging with team in AM  Wenda LowJames Abigayl Hor,  [facility] PGY-1 Resident

## 2014-03-11 NOTE — Progress Notes (Signed)
Family Medicine Teaching Service Daily Progress Note Intern Pager: [phone]  Patient name: [name] [name] Medical record number: [id number] Date of birth: 20-Sep-1976 Age: 38 y.o. Gender: female  Primary Care Provider: Marena ChancyLOSQ, STEPHANIE, MD Consultants: neurosurgery Code Status: full  Pt Overview and Major Events to Date:  4/8: Abdominal pain; Rec  Assessment and Plan: [name] SlickerJudith [name] is a 38 y.o. female presenting with abdominal pain and fever, several days post-op for baclofen pump replacement. Ddx includes infected hardware, surgical site infection, UTI. PMH is significant for spastic CP (mentally intact), neurogenic bladder with indwelling suprapubic catheter, recurrent PE on Xarelto.   # Abdominal pain: etiology unclear Possible UTI vs. Routine post-operative pain vs due to recent Botox. She had a urine culture drawn on 02/28/14 for unknown reason, which showed 20,000 colonies pf serratia, pseudomonas and enterococus. Serratia resistant to nitrofurantoin, pseudomonas sensitive only to ceftadizine, cefepime and enteroccus sensitive to amp, nitrofurantoin and vanc. She was asymptomatic at the time and therefore was not treated. Surgical site does not appear infected, instead appears to be reaction to steri strips. Pt seen by neurosurgery in the ER who reportedly did not feel this was a surgical site infection. May need to formally consult general surgery vs. Neurosurgery.  - considering imaging of abdomen  -IV fentanyl prn pain (has morphine allergy), benadryl prn itching  -Wound consulted: consider removal of steri strips - Urine & blood culture: pending  covering broadly with vanc/zosyn (4/9>>)   # Psych: hx of depression & OCD:  -continue home abilify, klonopin, lamictal, seroquel, zoloft, topamax   # CP/spasticity/chronic pain:  -continue home baclofen, Vicodin prn (with prn fentanyl as well), tizanidine   # Recurrent PE: continue xarelto   FEN/GI: soft diet, SLIV  Prophylaxis: on  xarelto   Disposition: admit to telemetry  Subjective:  Continues to endorse abdominal pain this morning, but now saying entire abdomen is painful- not just suprapubic.   Objective: Temp:  [98 F (36.7 C)-98.8 F (37.1 C)] 98 F (36.7 C) (04/10 0508) Pulse Rate:  [86-108] 91 (04/10 0508) Resp:  [11-19] 18 (04/10 0508) BP: (115-135)/(46-82) 125/50 mmHg (04/10 0508) SpO2:  [92 %-100 %] 96 % (04/10 0508) Weight:  [115 lb (52.164 kg)-123 lb 7.3 oz (56 kg)] 123 lb 7.3 oz (56 kg) (04/09 2149) Physical Exam: General: NAD, contracted lying in bed appears in pain  HEENT: MMM Cardiovascular: RRR, no murmurs  Respiratory: bilateral breath sounds present, transmitted upper airway sounds auscultated  Abdomen: approximately 10cm surgical incision present over right abdomen. Erythema and weeping/vesicles present surrounding steristrips which cover the incision. No would dehiscence or drainage. Suprapubic catheter site with some mild yellowish discharge but no surrounding erythema. Abdomen TTP; voluntary guarding throughout  Extremities: warm and well perfused  Neuro: contracted. Speech difficult to understand but fluent. Mentation in tact.  Laboratory:  Recent Labs Lab 03/07/14 0743 03/10/14 1807 03/11/14 0450  WBC 7.5 7.4 7.4  HGB 10.4* 10.5* 10.1*  HCT 32.8* 33.9* 32.0*  PLT 335 310 312    Recent Labs Lab 03/07/14 0743 03/10/14 1807 03/11/14 0450  NA 142 140 143  K 4.0 3.8 4.1  CL 105 103 107  CO2 22 22 22   BUN 12 9 7   CREATININE 0.49* 0.45* 0.48*  CALCIUM 8.6 8.8 8.3*  PROT  --  6.9  --   BILITOT  --  0.2*  --   ALKPHOS  --  99  --   ALT  --  11  --   AST  --  15  --   GLUCOSE 96 121* 84   Procalcitonin: wnl Lactic acid: 1.57  Urinalysis    Component Value Date/Time   COLORURINE YELLOW 03/10/2014 1854   APPEARANCEUR CLOUDY* 03/10/2014 1854   LABSPEC 1.009 03/10/2014 1854   PHURINE 7.0 03/10/2014 1854   GLUCOSEU NEGATIVE 03/10/2014 1854   HGBUR LARGE* 03/10/2014 1854    HGBUR large 10/03/2010 1530   BILIRUBINUR NEGATIVE 03/10/2014 1854   BILIRUBINUR neg 03/15/2011 1719   KETONESUR NEGATIVE 03/10/2014 1854   PROTEINUR 100* 03/10/2014 1854   PROTEINUR neg 03/15/2011 1719   UROBILINOGEN 0.2 03/10/2014 1854   UROBILINOGEN 0.2 03/15/2011 1719   NITRITE NEGATIVE 03/10/2014 1854   NITRITE neg 03/15/2011 1719   LEUKOCYTESUR MODERATE* 03/10/2014 1854    Imaging/Diagnostic Tests: CXR 4/9 FINDINGS:  The cardiac silhouette, mediastinal and hilar contours are within  normal limits and stable. The lungs are clear. The bony thorax is  intact. Thoracolumbar scoliosis appears stable.  IMPRESSION:  No acute cardiopulmonary findings.  Wenda Low, MD 03/11/2014, 7:44 AM PGY-1, [facility] Family Medicine FPTS Intern pager: [phone], text pages welcome

## 2014-03-11 NOTE — Discharge Summary (Signed)
Family Medicine Teaching [facility] Discharge Summary  Patient name: [name] [name] Medical record number: [id number] Date of birth: 05-12-1976 Age: 38 y.o. Gender: female Date of Admission: 03/10/2014  Date of Discharge: 03/15/14 Admitting Physician: Tobey Grim, MD  Primary Care Provider: [name], MD Consultants: None  Indication for Hospitalization: Abdominal Pain  Discharge Diagnoses/Problem List:   Constipation  UTI  CP w/ spasticity and chronic pain  Recurrent PE on Xarelto  Depression  OCD  Disposition: [facility]  Discharge Condition: stable  Brief Hospital Course:  [name] [name] is a 38 y.o. female presenting with abdominal pain and reported fever, several days after botox bladder injection and post-op for baclofen pump replacement. PMH is significant for spastic CP (mentally intact), neurogenic bladder with indwelling suprapubic catheter, recurrent PE on Xarelto. Blood and Urine cultures were obtained and vanc/zosyn started. She was seen by neurosurgery in the ER who reportedly did not feel this was a surgical site infection. UCx showed 45,000 colonies of SERRATIA MARCESCENS and antibiotics were narrowed to Cipro than discontinued on discharge as her pain felt to be due to constipation evident on CT abdomen. She was given enema with a good response and discharged back [facility] to continue ongoing management of chronic pain and chronic constipation.   # Abdominal pain: Eetiology likely related to constipation +/- UTI . Neurosurgery consulted in ED and did not think surgical site appeared infected. Treated for UTI based on UCx results below, however Abx stopped on discharge after 5 days of treatment. CT showed large stool burden, and she was treated with colace, miralax and enemas. Pain medications deescalated as likely contributing to constipation.    # Psych: hx of depression & OCD: Continued home abilify, klonopin, lamictal, seroquel, zoloft, topamax    # CP/spasticity/chronic pain: Continued home baclofen, hydrocodone, tizanidine. Fentanyl IV for pain while inpatient- stopped on discharge.    # Recurrent PE: continued xarelto  Issues for Follow Up:   Monitor for constipation, overflow diarrhea and adjust GI medications as needed  Continue to assess chronic pain and medication needs  Significant Procedures: none  Significant Labs and Imaging:   Recent Labs Lab 03/12/14 0444 03/13/14 0530 03/14/14 0545  WBC 5.8 6.5 6.7  HGB 9.5* 9.9* 11.2*  HCT 30.4* 31.6* 36.1  PLT 271 291 339    Recent Labs Lab 03/10/14 1807 03/11/14 0450 03/12/14 0444 03/13/14 0530 03/14/14 0545  NA 140 143 143 143 144  K 3.8 4.1 4.5 3.5* 3.9  CL 103 107 110 108 105  CO2 22 22 20 24 23   GLUCOSE 121* 84 95 86 89  BUN 9 7 6 6 7   CREATININE 0.45* 0.48* 0.46* 0.54 0.47*  CALCIUM 8.8 8.3* 8.7 8.8 9.5  ALKPHOS 99  --   --   --   --   AST 15  --   --   --   --   ALT 11  --   --   --   --   ALBUMIN 3.5  --   --   --   --    Results for orders placed during the hospital encounter of 03/10/14  CULTURE, BLOOD (ROUTINE X 2)     Status: None   Collection Time    03/10/14  6:49 PM      Result Value Ref Range Status   Specimen Description BLOOD LEFT ARM   Final   Special Requests BOTTLES DRAWN AEROBIC AND ANAEROBIC 5 CC   Final   Culture  Setup Time     Final   Value: 03/10/2014 23:10     Performed at [facility]   Culture     Final   Value:        BLOOD CULTURE RECEIVED NO GROWTH TO DATE CULTURE WILL BE HELD FOR 5 DAYS BEFORE ISSUING A FINAL NEGATIVE REPORT     Performed at [facility]   Report Status PENDING   Incomplete  URINE CULTURE     Status: None   Collection Time    03/10/14  6:54 PM      Result Value Ref Range Status   Specimen Description URINE, CATHETERIZED   Final   Special Requests NONE   Final   Culture  Setup Time     Final   Value: 03/10/2014 19:44     Performed at [facility] Count      Final   Value: 45,000 COLONIES/ML     Performed at [facility]   Culture     Final   Value: SERRATIA MARCESCENS     PSEUDOMONAS AERUGINOSA     Performed at [facility]   Report Status 03/15/2014 FINAL   Final   Organism ID, Bacteria SERRATIA MARCESCENS   Final   Organism ID, Bacteria PSEUDOMONAS AERUGINOSA   Final  CULTURE, BLOOD (ROUTINE X 2)     Status: None   Collection Time    03/10/14  6:55 PM      Result Value Ref Range Status   Specimen Description BLOOD LEFT ARM   Final   Special Requests BOTTLES DRAWN AEROBIC AND ANAEROBIC 10 CC   Final   Culture  Setup Time     Final   Value: 03/10/2014 23:09     Performed at [facility]   Culture     Final   Value:        BLOOD CULTURE RECEIVED NO GROWTH TO DATE CULTURE WILL BE HELD FOR 5 DAYS BEFORE ISSUING A FINAL NEGATIVE REPORT     Performed at [facility]   Report Status PENDING   Incomplete   Procalcitonin: wnl  Lactic acid: 1.57  Results/Tests Pending at Time of Discharge: none  Discharge Medications:    Medication List         acetaminophen 325 MG tablet  Commonly known as:  TYLENOL  Take 2 tablets (650 mg total) by mouth every 8 (eight) hours.     acetic acid 2 % otic solution  Commonly known as:  VOSOL  Place 4 drops into both ears once a week.     ARIPiprazole 5 MG tablet  Commonly known as:  ABILIFY  Take 5 mg by mouth every morning.     bacitracin-polymyxin b ointment  Commonly known as:  POLYSPORIN  Apply topically 2 (two) times daily as needed (at cath site for redness, discharge).     baclofen 10 MG tablet  Commonly known as:  LIORESAL  Take 2 tablets (20 mg total) by mouth 4 (four) times daily.     clonazePAM 1 MG tablet  Commonly known as:  KLONOPIN  Take 1 tablet (1 mg total) by mouth 4 (four) times daily.     diazepam 5 MG tablet  Commonly known as:  VALIUM  Take 1 tablet (5 mg total) by mouth every 6 (six) hours as needed for muscle spasms.     docusate  sodium 100 MG capsule  Commonly known as:  COLACE  Take 100 mg by mouth 2 (two) times daily.     HYDROcodone-acetaminophen 5-325 MG per tablet  Commonly known as:  NORCO/VICODIN  Take 1-2 tablets by mouth every 6 (six) hours as needed for severe pain.     ibuprofen 200 MG tablet  Commonly known as:  ADVIL,MOTRIN  Take 400 mg by mouth every 8 (eight) hours as needed for pain.     lamoTRIgine 200 MG tablet  Commonly known as:  LAMICTAL  Take 200 mg by mouth daily.     lidocaine 5 %  Commonly known as:  LIDODERM  Place 1 patch onto the skin daily. Remove & Discard patch within 12 hours or as directed by MD     loratadine 10 MG tablet  Commonly known as:  CLARITIN  Take 10 mg by mouth daily as needed for allergies.     multivitamin with minerals Tabs tablet  Take 1 tablet by mouth every evening.     polyethylene glycol packet  Commonly known as:  MIRALAX / GLYCOLAX  Take 17 g by mouth 2 (two) times daily.     QUEtiapine 100 MG tablet  Commonly known as:  SEROQUEL  Take 100 mg by mouth at bedtime.     rivaroxaban 20 MG Tabs tablet  Commonly known as:  XARELTO  Take 1 tablet (20 mg total) by mouth daily with supper.     sertraline 100 MG tablet  Commonly known as:  ZOLOFT  Take 200 mg by mouth at bedtime.     sodium phosphate 7-19 GM/118ML Enem  Place 133 mLs (1 enema total) rectally daily as needed for severe constipation.     SUMAtriptan 100 MG tablet  Commonly known as:  IMITREX  Take 100 mg by mouth every 2 (two) hours as needed for migraine.     tiZANidine 4 MG capsule  Commonly known as:  ZANAFLEX  Take 1 capsule (4 mg total) by mouth 3 (three) times daily.     topiramate 100 MG tablet  Commonly known as:  TOPAMAX  Take 100 mg by mouth at bedtime.       CXR 4/9 FINDINGS:  The cardiac silhouette, mediastinal and hilar contours are within  normal limits and stable. The lungs are clear. The bony thorax is  intact. Thoracolumbar scoliosis appears stable.   IMPRESSION:  No acute cardiopulmonary findings.  CT Ab FINDINGS:  There is a trace right pleural effusion. There is right basilar  dependent atelectasis.  The liver demonstrates no focal abnormality. There is no  intrahepatic or extrahepatic biliary ductal dilatation. The  gallbladder is normal. The spleen demonstrates no focal  abnormality.There is a 1.7 x 1.6 cm hyperdense, fluid attenuating  left renal interpolar mass most consistent with a cyst. The right  kidney, adrenal glands and pancreas are normal. There is a  suprapubic catheter within the bladder. The bladder wall is  thickened consistent with a neurogenic bladder. There is an  air-fluid level within the bladder likely iatrogenic.  The stomach, duodenum, small intestine, and large intestine  demonstrate no contrast extravasation or dilatation. There is a  large amount of stool in the rectosigmoid colon. There is no  pneumoperitoneum, pneumatosis, or portal venous gas. There is no  abdominal or pelvic free fluid. There is no lymphadenopathy.  The abdominal aorta is normal in caliber with atherosclerosis.  There are no lytic or sclerotic osseous lesions. There is a baclofen  pump in the right anterior lower abdominal wall with the tubing  extending into the spinal canal terminating at the level of T10.  IMPRESSION:  1. No acute abdominal or pelvic pathology.  2. Large amount of stool in the rectosigmoid colon.  3. Trace right pleural effusion with atelectasis.  4. Suprapubic catheter in satisfactory position.  Discharge Instructions: Please refer to Patient Instructions section of EMR for full details.  Patient was counseled important signs and symptoms that should prompt return to medical care, changes in medications, dietary instructions, activity restrictions, and follow up appointments.   Follow-Up Appointments: Follow-up Information   Call [name], MD. (As needed)    Specialty:  Family Medicine   Contact  information:   [address] [place] [address] [phone]       Wenda Low, MD 03/15/2014, 1:00 PM PGY-1, [facility] Family Medicine

## 2014-03-11 NOTE — Progress Notes (Signed)
I have seen and examined this patient. I have discussed with Dr Joyner.  I agree with their findings and plans as documented in their progress note.    

## 2014-03-11 NOTE — Progress Notes (Signed)
Nutrition Brief Note  Patient identified on the Malnutrition Screening Tool (MST) Report  Wt Readings from Last 15 Encounters:  03/10/14 123 lb 7.3 oz (56 kg)  03/07/14 115 lb 1.3 oz (52.2 kg)  03/07/14 115 lb 1.3 oz (52.2 kg)  07/15/13 115 lb (52.164 kg)  06/25/13 106 lb (48.081 kg)  06/07/13 106 lb (48.081 kg)  03/10/13 121 lb (54.885 kg)  09/23/11 117 lb (53.071 kg)  06/17/11 125 lb 6.4 oz (56.881 kg)  05/15/11 128 lb (58.06 kg)  05/10/11 127 lb 9.6 oz (57.879 kg)  04/11/11 134 lb 12.8 oz (61.145 kg)  04/03/11 132 lb 9.6 oz (60.147 kg)  03/27/11 134 lb 8 oz (61.009 kg)  02/15/11 140 lb (63.504 kg)    Body mass index is 24.92 kg/(m^2). Patient meets criteria for Normal Weight based on current BMI. Per weight history, pt has not had any recent weight loss, her weight has been trending up the past several months. Pt reports having a good appetite and denies use of nutritional supplements. Pt requesting french fries and sweet tea.  Per chart, nursing home reported pt has been eating and drinking well.   Current diet order is Soft Diet, patient is consuming approximately 75% of meals at this time. Per nurse tech pt ate well at breakfast today. Labs and medications reviewed.   No nutrition interventions warranted at this time. If nutrition issues arise, please consult RD.   Ian Malkineanne Barnett RD, LDN Inpatient Clinical Dietitian Pager: [phone] After Hours Pager: [phone]

## 2014-03-11 NOTE — H&P (Signed)
FMTS Attending Admission Note: Renold DonJeff Aritza Brunet MD Personal pager:  [phone] FPTS Service Pager:  [phone]  I  have seen and examined this patient, reviewed their chart. I have discussed this patient with the resident. I agree with the resident's findings, assessment and care plan.  Additionally:  38 yo with known CP, neurogenic bladder and indwelling catheter, recurrent PE who [facility] with several days increasing abdominal pain and fever.  Pain started on Saturday and progressing.  Had Baclofen pump placed on Monday.  Redness at incision site in abdomen triggered concern for infection, [facility]  Exam:  Gen:  Spastic appearance BL UE's.  Pleasant and alert Abdomen:  Inflammatory reaction (erythematous and slightly edematous area directly under steri-striops) around healing and non-infected surgical site RUQ of abdomen.  Distended bladder noted.  Suprapubic site clean and no signs of infection  Imp/Plan: 1.  Abdomen pain/fever: - likely UTI.  - no infection of surgical site.  - agree with broad spectrum antibiotics until 24-48 hours afebrile or return of cultures.    2.  Inflammatory response of surgical site: - likely secondary to steri-strips - I removed these.  Should begin to improve.   - Desonide to treat.  Keep clear of wound.    3.  Dispo:   - [facility] - Stop IVF due to shortage and fact she is eating and drinkign well.   Tobey GrimJeffrey H Manning Luna, MD 03/11/2014 7:22 AM

## 2014-03-11 NOTE — Care Management Note (Signed)
    Page 1 of 1   03/15/2014     5:06:02 PM   CARE MANAGEMENT NOTE 03/15/2014  Patient:  Mindi SlickerBRADHAM,Tracie   Account Number:  [id number]  Date Initiated:  03/11/2014  Documentation initiated by:  Sarahy Creedon  Subjective/Objective Assessment:   PT ADM ON 03/10/14 WITH ABD PAIN, UTI.  [facility]     Action/Plan:   WILL CONSULT CSW TO FACILITATE DC TO SNF WHEN MEDICALLY STABLE FOR DC.   Anticipated DC Date:  03/14/2014   Anticipated DC Plan:  SKILLED NURSING FACILITY  In-house referral  Clinical Social Worker      DC Planning Services  CM consult      Choice offered to / List presented to:             Status of service:  Completed, signed off Medicare Important Message given?   (If response is "NO", the following Medicare IM given date fields will be blank) Date Medicare IM given:   Date Additional Medicare IM given:    Discharge Disposition:  SKILLED NURSING FACILITY  Per UR Regulation:  Reviewed for med. necessity/level of care/duration of stay  If discussed at Long Length of Stay Meetings, dates discussed:   03/15/2014    Comments:  03/15/14 Imogean Ciampa,RN,BSN [phone] PT DC BACK TO [facility] TODAY, PER CSW ARRANGEMENTS.

## 2014-03-11 NOTE — Consult Note (Addendum)
WOC wound consult note Reason for Consult: Consult requested for peri-wound skin.  Pt has a surgical site to right abd and further wound care should be deferred to neurosurgical team; [facility]. Neuro surgical consult pending, according to EMR. Wound type: Full thickness post-op incision. Measurement: Area surrounding wound is red and macerated; appearance consistent with irritant dermatitis from previous dressing or tape: .5.5X13cm in a square shape. Wound bed: Wound incision closed to 90%, 10% open with depth of .3cm Drainage (amount, consistency, odor) Small amt yellow drainage Dressing procedure/placement/frequency: Applied soft silicone dressing which has a low incidence of skin reactions according to the literature.  This will wick moisture away from wound and protect skin. Please re-consult if further assistance is needed.  Thank-you,  Cammie Mcgeeawn Aliyah Abeyta MSN, RN, CWOCN, [place] [facility]-AP, CNS [phone]

## 2014-03-12 DIAGNOSIS — A419 Sepsis, unspecified organism: Secondary | ICD-10-CM

## 2014-03-12 LAB — CBC
HEMATOCRIT: 30.4 % — AB (ref 36.0–46.0)
Hemoglobin: 9.5 g/dL — ABNORMAL LOW (ref 12.0–15.0)
MCH: 25.5 pg — ABNORMAL LOW (ref 26.0–34.0)
MCHC: 31.3 g/dL (ref 30.0–36.0)
MCV: 81.5 fL (ref 78.0–100.0)
PLATELETS: 271 10*3/uL (ref 150–400)
RBC: 3.73 MIL/uL — ABNORMAL LOW (ref 3.87–5.11)
RDW: 17 % — ABNORMAL HIGH (ref 11.5–15.5)
WBC: 5.8 10*3/uL (ref 4.0–10.5)

## 2014-03-12 LAB — BASIC METABOLIC PANEL
BUN: 6 mg/dL (ref 6–23)
CHLORIDE: 110 meq/L (ref 96–112)
CO2: 20 mEq/L (ref 19–32)
Calcium: 8.7 mg/dL (ref 8.4–10.5)
Creatinine, Ser: 0.46 mg/dL — ABNORMAL LOW (ref 0.50–1.10)
Glucose, Bld: 95 mg/dL (ref 70–99)
POTASSIUM: 4.5 meq/L (ref 3.7–5.3)
SODIUM: 143 meq/L (ref 137–147)

## 2014-03-12 LAB — INFLUENZA PANEL BY PCR (TYPE A & B)
H1N1 flu by pcr: NOT DETECTED
INFLBPCR: NEGATIVE
Influenza A By PCR: NEGATIVE

## 2014-03-12 MED ORDER — SUMATRIPTAN SUCCINATE 100 MG PO TABS
100.0000 mg | ORAL_TABLET | ORAL | Status: DC | PRN
  Administered 2014-03-12 – 2014-03-13 (×3): 100 mg via ORAL
  Filled 2014-03-12 (×4): qty 1

## 2014-03-12 NOTE — Progress Notes (Signed)
I discussed with  Dr Joyner.  I agree with their plans documented in their progress note for today.  

## 2014-03-12 NOTE — Progress Notes (Addendum)
Family Medicine Teaching Service Daily Progress Note Intern Pager: [phone]  Patient name: [name] SlickerJudith Forero Medical record number: [id number] Date of birth: 1976-06-23 Age: 38 y.o. Gender: female  Primary Care Provider: Marena ChancyLOSQ, STEPHANIE, MD Consultants: neurosurgery Code Status: full  Pt Overview and Major Events to Date:  4/9: Abdominal pain; UCx & BCx; Vanc/Zosyn 4/11: UCx = re incubated for better growth; abdominal pain improved    Assessment and Plan: [name] [name] is a 38 y.o. female presenting with abdominal pain and fever, several days post-op for baclofen pump replacement. Ddx includes infected hardware, surgical site infection, UTI. PMH is significant for spastic CP (mentally intact), neurogenic bladder with indwelling suprapubic catheter, recurrent PE on Xarelto.   # Abdominal pain: etiology unclear Possible UTI vs. Routine post-operative pain vs due to recent Botox. She had a urine culture drawn on 02/28/14 for unknown reason, which showed 20,000 colonies pf serratia, pseudomonas and enterococus. Serratia resistant to nitrofurantoin, pseudomonas sensitive only to ceftadizine, cefepime and enteroccus sensitive to amp, nitrofurantoin and vanc. She was asymptomatic at the time and therefore was not treated. Surgical site does not appear infected, instead appears to be reaction to steri strips. Pt seen by neurosurgery in the ER who reportedly did not feel this was a surgical site infection. May need to formally consult general surgery vs. Neurosurgery.  - considering imaging of abdomen  -IV fentanyl prn pain (has morphine allergy), benadryl prn itching  - Afeb x 36 hrs; WBC remains wnl - Cultures  Urine: re-incubated for better growth  blood culture: NGTD: Drawn 4/9 11pm - Abx  covering broadly with vanc/zosyn (4/9>>); Continue pending UCx  # Psych: hx of depression & OCD:  -continue home abilify, klonopin, lamictal, seroquel, zoloft, topamax   # CP/spasticity/chronic pain:   -continue home baclofen, Vicodin prn (with prn fentanyl as well), tizanidine   # Recurrent PE: continue xarelto   FEN/GI: soft diet, SLIV  Prophylaxis: on xarelto   Disposition: admit to telemetry  Subjective:  Continues to endorse abdominal pain this morning that is improved.  Objective: Temp:  [97.7 F (36.5 C)-98.8 F (37.1 C)] 98.2 F (36.8 C) (04/11 0300) Pulse Rate:  [75-82] 75 (04/11 0300) Resp:  [16-18] 16 (04/11 0300) BP: (90-110)/(54-57) 93/57 mmHg (04/11 0300) SpO2:  [95 %-97 %] 95 % (04/11 0300) Physical Exam: General: NAD, contracted lying in bed appears in pain  HEENT: MMM Cardiovascular: RRR, no murmurs  Respiratory: bilateral breath sounds present, transmitted upper airway sounds auscultated  Abdomen: approximately 10cm surgical incision present over right abdomen. Erythema and weeping/vesicles present. No would dehiscence or drainage. Suprapubic catheter site with some mild yellowish discharge but no surrounding erythema. Abdomen TTP; voluntary guarding throughout  Extremities: warm and well perfused  Neuro: contracted. Speech difficult to understand but fluent. Mentation in tact.  Laboratory:  Recent Labs Lab 03/10/14 1807 03/11/14 0450 03/12/14 0444  WBC 7.4 7.4 5.8  HGB 10.5* 10.1* 9.5*  HCT 33.9* 32.0* 30.4*  PLT 310 312 271    Recent Labs Lab 03/10/14 1807 03/11/14 0450 03/12/14 0444  NA 140 143 143  K 3.8 4.1 4.5  CL 103 107 110  CO2 22 22 20   BUN 9 7 6   CREATININE 0.45* 0.48* 0.46*  CALCIUM 8.8 8.3* 8.7  PROT 6.9  --   --   BILITOT 0.2*  --   --   ALKPHOS 99  --   --   ALT 11  --   --   AST 15  --   --  GLUCOSE 121* 84 95   Procalcitonin: wnl Lactic acid: 1.57  Urinalysis    Component Value Date/Time   COLORURINE YELLOW 03/10/2014 1854   APPEARANCEUR CLOUDY* 03/10/2014 1854   LABSPEC 1.009 03/10/2014 1854   PHURINE 7.0 03/10/2014 1854   GLUCOSEU NEGATIVE 03/10/2014 1854   HGBUR LARGE* 03/10/2014 1854   HGBUR large 10/03/2010 1530    BILIRUBINUR NEGATIVE 03/10/2014 1854   BILIRUBINUR neg 03/15/2011 1719   KETONESUR NEGATIVE 03/10/2014 1854   PROTEINUR 100* 03/10/2014 1854   PROTEINUR neg 03/15/2011 1719   UROBILINOGEN 0.2 03/10/2014 1854   UROBILINOGEN 0.2 03/15/2011 1719   NITRITE NEGATIVE 03/10/2014 1854   NITRITE neg 03/15/2011 1719   LEUKOCYTESUR MODERATE* 03/10/2014 1854    Imaging/Diagnostic Tests: CXR 4/9 FINDINGS:  The cardiac silhouette, mediastinal and hilar contours are within  normal limits and stable. The lungs are clear. The bony thorax is  intact. Thoracolumbar scoliosis appears stable.  IMPRESSION:  No acute cardiopulmonary findings.  Wenda Low, MD 03/12/2014, 6:36 AM PGY-1, [facility] FPTS Intern pager: [phone], text pages welcome

## 2014-03-12 NOTE — Progress Notes (Signed)
PGY-2 Interim Note  Received call from RN; family wishes pt to be tested for flu (several family members report being ill and they would like to rule it out). Will place order, though symptoms have been present more then 48-72 hours, so even if positive doubt Tamiflu would be of much benefit. Will f/u as needed.  Bobbye Mortonhristopher M Street, MD PGY-2, [facility] 03/12/2014, 2:13 PM FPTS Service pager: [phone] (text pages welcome through [employer])

## 2014-03-13 ENCOUNTER — Inpatient Hospital Stay (HOSPITAL_COMMUNITY): Payer: Medicaid Other

## 2014-03-13 DIAGNOSIS — M2450 Contracture, unspecified joint: Secondary | ICD-10-CM

## 2014-03-13 DIAGNOSIS — T85698A Other mechanical complication of other specified internal prosthetic devices, implants and grafts, initial encounter: Secondary | ICD-10-CM

## 2014-03-13 DIAGNOSIS — D649 Anemia, unspecified: Secondary | ICD-10-CM

## 2014-03-13 LAB — SEDIMENTATION RATE: Sed Rate: 20 mm/hr (ref 0–22)

## 2014-03-13 LAB — BASIC METABOLIC PANEL
BUN: 6 mg/dL (ref 6–23)
CO2: 24 mEq/L (ref 19–32)
Calcium: 8.8 mg/dL (ref 8.4–10.5)
Chloride: 108 mEq/L (ref 96–112)
Creatinine, Ser: 0.54 mg/dL (ref 0.50–1.10)
GFR calc Af Amer: >90 mL/min (ref >90)
Glucose, Bld: 86 mg/dL (ref 70–99)
Potassium: 3.5 mEq/L — ABNORMAL LOW (ref 3.7–5.3)
Sodium: 143 mEq/L (ref 137–147)

## 2014-03-13 LAB — CBC
HEMATOCRIT: 31.6 % — AB (ref 36.0–46.0)
Hemoglobin: 9.9 g/dL — ABNORMAL LOW (ref 12.0–15.0)
MCH: 25.4 pg — ABNORMAL LOW (ref 26.0–34.0)
MCHC: 31.3 g/dL (ref 30.0–36.0)
MCV: 81.2 fL (ref 78.0–100.0)
PLATELETS: 291 10*3/uL (ref 150–400)
RBC: 3.89 MIL/uL (ref 3.87–5.11)
RDW: 16.7 % — AB (ref 11.5–15.5)
WBC: 6.5 10*3/uL (ref 4.0–10.5)

## 2014-03-13 MED ORDER — IOHEXOL 300 MG/ML  SOLN
100.0000 mL | Freq: Once | INTRAMUSCULAR | Status: AC | PRN
  Administered 2014-03-13: 100 mL via INTRAVENOUS

## 2014-03-13 MED ORDER — POTASSIUM CHLORIDE CRYS ER 20 MEQ PO TBCR
40.0000 meq | EXTENDED_RELEASE_TABLET | Freq: Once | ORAL | Status: AC
  Administered 2014-03-13: 40 meq via ORAL
  Filled 2014-03-13: qty 2

## 2014-03-13 MED ORDER — DEXTROSE 5 % IV SOLN
1.0000 g | INTRAVENOUS | Status: DC
  Administered 2014-03-13: 1 g via INTRAVENOUS
  Filled 2014-03-13: qty 10

## 2014-03-13 MED ORDER — IOHEXOL 300 MG/ML  SOLN
25.0000 mL | INTRAMUSCULAR | Status: AC
  Administered 2014-03-13 (×2): 25 mL via ORAL

## 2014-03-13 NOTE — Progress Notes (Signed)
ANTIBIOTIC CONSULT NOTE - FOLLOW UP  Pharmacy Consult for Vancomycin, Zosyn Indication: UTI  Allergies  Allergen Reactions  . Morphine Dermatitis    Skin turned red    Patient Measurements: Height: 4\' 11"  (149.9 cm) Weight: 123 lb 7.3 oz (56 kg) IBW/kg (Calculated) : 43.2  Vital Signs: Temp: 97.6 F (36.4 C) (04/12 0521) Temp src: Axillary (04/12 0521) BP: 101/62 mmHg (04/12 0521) Pulse Rate: 73 (04/12 0521) Intake/Output from previous day: 04/11 0701 - 04/12 0700 In: 3085.7 [P.O.:630; I.V.:605.7; IV Piggyback:50] Out: -  Intake/Output from this shift: Total I/O In: 240 [P.O.:240] Out: -   Labs:  Recent Labs  03/11/14 0450 03/12/14 0444 03/13/14 0530  WBC 7.4 5.8 6.5  HGB 10.1* 9.5* 9.9*  PLT 312 271 291  CREATININE 0.48* 0.46* 0.54   Estimated Creatinine Clearance: 71.3 ml/min (by C-G formula based on Cr of 0.54). No results found for this basename: VANCOTROUGH, Leodis Binet, VANCORANDOM, GENTTROUGH, GENTPEAK, GENTRANDOM, TOBRATROUGH, TOBRAPEAK, TOBRARND, AMIKACINPEAK, AMIKACINTROU, AMIKACIN,  in the last 72 hours   Microbiology: Recent Results (from the past 720 hour(s))  SURGICAL PCR SCREEN     Status: None   Collection Time    03/07/14  8:20 AM      Result Value Ref Range Status   MRSA, PCR NEGATIVE  NEGATIVE Final   Staphylococcus aureus NEGATIVE  NEGATIVE Final   Comment:            The Xpert SA Assay (FDA     approved for NASAL specimens     in patients over 28 years of age),     is one component of     a comprehensive surveillance     program.  Test performance has     been validated by [employer] for patients greater     than or equal to 107 year old.     It is not intended     to diagnose infection nor to     guide or monitor treatment.  MRSA PCR SCREENING     Status: None   Collection Time    03/07/14  3:29 PM      Result Value Ref Range Status   MRSA by PCR NEGATIVE  NEGATIVE Final   Comment:            The GeneXpert MRSA Assay  (FDA     approved for NASAL specimens     only), is one component of a     comprehensive MRSA colonization     surveillance program. It is not     intended to diagnose MRSA     infection nor to guide or     monitor treatment for     MRSA infections.  CULTURE, BLOOD (ROUTINE X 2)     Status: None   Collection Time    03/10/14  6:49 PM      Result Value Ref Range Status   Specimen Description BLOOD LEFT ARM   Final   Special Requests BOTTLES DRAWN AEROBIC AND ANAEROBIC 5 CC   Final   Culture  Setup Time     Final   Value: 03/10/2014 23:10     Performed at [facility]   Culture     Final   Value:        BLOOD CULTURE RECEIVED NO GROWTH TO DATE CULTURE WILL BE HELD FOR 5 DAYS BEFORE ISSUING A FINAL NEGATIVE REPORT     Performed at [facility]  Report Status PENDING   Incomplete  URINE CULTURE     Status: None   Collection Time    03/10/14  6:54 PM      Result Value Ref Range Status   Specimen Description URINE, CATHETERIZED   Final   Special Requests NONE   Final   Culture  Setup Time     Final   Value: 03/10/2014 19:44     Performed at [facility]   Colony Count     Final   Value: 45,000 COLONIES/ML     Performed at [facility]   Culture     Final   Value: GRAM NEGATIVE RODS     Performed at [facility]   Report Status PENDING   Incomplete  CULTURE, BLOOD (ROUTINE X 2)     Status: None   Collection Time    03/10/14  6:55 PM      Result Value Ref Range Status   Specimen Description BLOOD LEFT ARM   Final   Special Requests BOTTLES DRAWN AEROBIC AND ANAEROBIC 10 CC   Final   Culture  Setup Time     Final   Value: 03/10/2014 23:09     Performed at [facility]   Culture     Final   Value:        BLOOD CULTURE RECEIVED NO GROWTH TO DATE CULTURE WILL BE HELD FOR 5 DAYS BEFORE ISSUING A FINAL NEGATIVE REPORT     Performed at [facility]   Report Status PENDING   Incomplete    Anti-infectives   Start      Dose/Rate Route Frequency Ordered Stop   03/11/14 0700  vancomycin (VANCOCIN) IVPB 750 mg/150 ml premix     750 mg 150 mL/hr over 60 Minutes Intravenous Every 12 hours 03/10/14 1841     03/11/14 0400  piperacillin-tazobactam (ZOSYN) IVPB 3.375 g     3.375 g 12.5 mL/hr over 240 Minutes Intravenous 3 times per day 03/10/14 1841     03/10/14 1815  piperacillin-tazobactam (ZOSYN) IVPB 3.375 g     3.375 g 100 mL/hr over 30 Minutes Intravenous  Once 03/10/14 1807 03/10/14 1924   03/10/14 1815  vancomycin (VANCOCIN) IVPB 1000 mg/200 mL premix     1,000 mg 200 mL/hr over 60 Minutes Intravenous  Once 03/10/14 1807 03/10/14 2035      Admit Complaint: 38 y.o. female presents from NH with c/o abd pain, fever up to 102. Baclofen pump changed out 03/07/14. Recent urine cx 02/28/14 shows 20kcol serratia, pseudomonas, enterococcus (Serratia resistant to nitrofurantoin, pseudomonas sensitive only to ceftadizine, cefepime and enteroccus sensitive to amp, nitrofurantoin and vanc). Pharmacy consulted to dose vanc and zosyn 4/9  4/12 Events: no events, to narrow abx 4/13 PMH: h/o spastic CP, neurogenic bladder with indwelling suprapubic catheter, recurrent PE (on Xarelto), HTN, OCD, depression, migraine, DJD, h/o recurrent UTIs, GERD, anemia  Anticoagulation: Xarelto PTA for recurrent PE.   Infectious Disease: UTI Afeb, WBC wnl  4/9 Vanc>> 4/9 Zosyn>>  4/9 urine 4/9 blood  Neurology: hx depression and OCD on abilify, klon, lamictal, seroquel, zoloft, and topamax  Nephrology: SCr may not be reflective of renal function in pt with cerebral palsy due to muscle wasting. Pulmonary: RA  Plan:  1. Zosyn 3.375gm IV q8h.  2. Vancomycin 750mg  IV q12h> given CP will check trough this afternoon due to risk for accumulation 3. Will f/u micro data, pt's clinical condition, UOP, renal function  Thank you for  allowing pharmacy to be a part of this patients care team.  Lovenia Kim Pharm.D., BCPS,  AQ-Cardiology Clinical Pharmacist 03/13/2014 12:48 PM Pager: [phone] Phone: [phone]

## 2014-03-13 NOTE — Progress Notes (Signed)
Family Medicine Teaching Service Daily Progress Note Intern Pager: [phone]  Patient name: [name] [name] [place] Medical record number: [id number] Date of birth: 30-Jan-1976 Age: 38 y.o. Gender: female  Primary Care Provider: Marena ChancyLOSQ, Makayela Secrest, MD Consultants: neurosurgery Code Status: Full  Pt Overview and Major Events to Date:  4/9: Abdominal pain; UCx & BCx; Vanc/Zosyn  4/11: UCx = re incubated for better growth; abdominal pain improved   Assessment and Plan: [name] [name] Veenstra is a [age] y.o. female presenting with abdominal pain and fever, several days post-op for baclofen pump replacement.  PMH is significant for spastic CP (mentally intact), neurogenic bladder with indwelling suprapubic catheter, recurrent PE on Xarelto.   # Abdominal pain and fever:  UTI related vs post op pain from baclofen pump vs spastic bladder. Pt seen by neurosurgery in the ER who reportedly did not feel this was a surgical site infection. Afebrile since admission.  - given continued abdominal pain, will obtain CT abdomen and pelvis.  - if normal CT and continued abdominal pain tomorrow, consider urology consult tomorrow - IV fentanyl prn pain (has morphine allergy): still requiring IV fentanyl. Consider switching to oral pain meds tomorrow.  - afebrile since admission. Likely UTI related - flu negative - blood culture from 03/10/14 at 11pm no growth - urine culture showing 45,000 gram negative rods - continue vanc/zosyn (03/10/14 start) until sensitivities return and narrow antibiotic coverage tomorrow  # Psych: hx of depression & OCD:  -continue home abilify, klonopin, lamictal, seroquel, zoloft, topamax  # CP/spasticity/chronic pain:  -continue home baclofen, Vicodin prn (with prn fentanyl as well), tizanidine  # Recurrent PE: continue xarelto - anemia present with Hg: 9.9 stable from previous. No evidence of active bleeding - monitor and consider iron studies.   FEN/GI: soft diet, SLIV  Prophylaxis: on xarelto    [facility] days   Subjective:  Suprapubic abdominal pain woke her up from sleep. Better than when initially admitted. Continues to use fentanyl throughout the day.  Loose BM. No fevers. Eating and drinking  Objective: Temp:  [97.6 F (36.4 C)-97.9 F (36.6 C)] 97.6 F (36.4 C) (04/12 0521) Pulse Rate:  [73-78] 73 (04/12 0521) Resp:  [18-20] 20 (04/12 0521) BP: (101-113)/(62-72) 101/62 mmHg (04/12 0521) SpO2:  [96 %-99 %] 96 % (04/12 0521) Physical Exam: General: no acute distress, laying in bed, spastic contractures bilaterally Cardiovascular: S1S2, RRR, no murmur appreciated Respiratory: normal work of breathing, clear on anterior exam Abdomen: soft, tender in suprapubic region, no tenderness above incision and baclofen pump. Normal BS.  Extremities: no significant edema Skin: right abdominal incision intact and healing, surrounding superficial erythema  Laboratory:  Recent Labs Lab 03/11/14 0450 03/12/14 0444 03/13/14 0530  WBC 7.4 5.8 6.5  HGB 10.1* 9.5* 9.9*  HCT 32.0* 30.4* 31.6*  PLT 312 271 291    Recent Labs Lab 03/10/14 1807 03/11/14 0450 03/12/14 0444 03/13/14 0530  NA 140 143 143 143  K 3.8 4.1 4.5 3.5*  CL 103 107 110 108  CO2 22 22 20 24   BUN 9 7 6 6   CREATININE 0.45* 0.48* 0.46* 0.54  CALCIUM 8.8 8.3* 8.7 8.8  PROT 6.9  --   --   --   BILITOT 0.2*  --   --   --   ALKPHOS 99  --   --   --   ALT 11  --   --   --   AST 15  --   --   --  GLUCOSE 121* 84 95 86    Imaging/Diagnostic Tests: EXAM:  PORTABLE CHEST - 1 VIEW 03/10/14 COMPARISON: 07/19/2013  FINDINGS:  The cardiac silhouette, mediastinal and hilar contours are within  normal limits and stable. The lungs are clear. The bony thorax is  intact. Thoracolumbar scoliosis appears stable.  IMPRESSION:  No acute cardiopulmonary findings.   Lonia Skinner, MD 03/13/2014, 8:22 AM PGY-3, [facility] FPTS Intern pager: [phone], text pages  welcome

## 2014-03-13 NOTE — Progress Notes (Signed)
I discussed with  Dr Gwenlyn SaranLosq.  I agree with their plans documented in their progress note for today. GNR UTI working diagnosis on Zosyn. Await ur cx ID.  If suprapubic pain persists, then obtain CT abdomin/pelvis with contrast to look for complications of  Lower GU infection.  If CT unremarkable and suprapubic pain persists into tomorrow, consult urology.

## 2014-03-14 ENCOUNTER — Encounter (HOSPITAL_COMMUNITY): Payer: Self-pay | Admitting: Neurosurgery

## 2014-03-14 LAB — FERRITIN: FERRITIN: 7 ng/mL — AB (ref 10–291)

## 2014-03-14 LAB — IRON AND TIBC
Iron: 18 ug/dL — ABNORMAL LOW (ref 42–135)
Saturation Ratios: 5 % — ABNORMAL LOW (ref 20–55)
TIBC: 347 ug/dL (ref 250–470)
UIBC: 329 ug/dL (ref 125–400)

## 2014-03-14 LAB — CBC
HCT: 36.1 % (ref 36.0–46.0)
HEMOGLOBIN: 11.2 g/dL — AB (ref 12.0–15.0)
MCH: 25.2 pg — ABNORMAL LOW (ref 26.0–34.0)
MCHC: 31 g/dL (ref 30.0–36.0)
MCV: 81.1 fL (ref 78.0–100.0)
PLATELETS: 339 10*3/uL (ref 150–400)
RBC: 4.45 MIL/uL (ref 3.87–5.11)
RDW: 16.6 % — ABNORMAL HIGH (ref 11.5–15.5)
WBC: 6.7 10*3/uL (ref 4.0–10.5)

## 2014-03-14 LAB — BASIC METABOLIC PANEL
BUN: 7 mg/dL (ref 6–23)
CALCIUM: 9.5 mg/dL (ref 8.4–10.5)
CO2: 23 mEq/L (ref 19–32)
Chloride: 105 mEq/L (ref 96–112)
Creatinine, Ser: 0.47 mg/dL — ABNORMAL LOW (ref 0.50–1.10)
Glucose, Bld: 89 mg/dL (ref 70–99)
POTASSIUM: 3.9 meq/L (ref 3.7–5.3)
Sodium: 144 mEq/L (ref 137–147)

## 2014-03-14 MED ORDER — FLEET ENEMA 7-19 GM/118ML RE ENEM
1.0000 | ENEMA | Freq: Once | RECTAL | Status: AC
  Administered 2014-03-14: 13:00:00 via RECTAL
  Filled 2014-03-14: qty 1

## 2014-03-14 MED ORDER — CIPROFLOXACIN HCL 250 MG PO TABS
250.0000 mg | ORAL_TABLET | Freq: Two times a day (BID) | ORAL | Status: DC
  Administered 2014-03-14 – 2014-03-15 (×3): 250 mg via ORAL
  Filled 2014-03-14 (×5): qty 1

## 2014-03-14 MED ORDER — FENTANYL CITRATE 0.05 MG/ML IJ SOLN: 50.0000 ug | INTRAMUSCULAR | Status: DC | PRN

## 2014-03-14 NOTE — Clinical Documentation Improvement (Signed)
  Per Notes patient with multiple joint contractures and spasticity from CP. Operates electric wheelchair with left hand. Please indicate in Notes/DC summary if any of the following diagnoses describe this patient's condition. Thank you.  Possible Clinical Conditions? - Functional quadriplegia - Functional quadriparesis  - Other Condition  Thank You, Beverley FiedlerLaurie E Kaydn Kumpf ,RN Clinical Documentation Specialist:  [phone]  [facility]- Health Information Management

## 2014-03-14 NOTE — Progress Notes (Signed)
Clinical Social Work Department BRIEF PSYCHOSOCIAL ASSESSMENT 03/14/2014  Patient:  [name] [name], [name] [name]     Account Number:  [id number]     Admit date:  03/10/2014  Clinical Social Worker:  Megan Salon  Date/Time:  03/14/2014 11:25 AM  Referred by:  Care Management  Date Referred:  03/14/2014 Referred for  SNF Placement   Other Referral:   Interview type:  Patient Other interview type:    PSYCHOSOCIAL DATA Living Status:  FACILITY Admitted from facility:  [place] Level of care:  [place] Primary support name:  Maddisyn Hegwood Primary support relationship to patient:  PARENT Degree of support available:   Good    CURRENT CONCERNS Current Concerns  Post-Acute Placement   Other Concerns:    SOCIAL WORK ASSESSMENT / PLAN CSW received consult for RN that patient is from a SNF. Per chart review, pt is from [facility]. CSW met with pt at bedside to make sure pt would be returning to [place] at dc. Patient states she likes [place] and plans on returning back when ready.. CSW contacted [facility] regarding pt being in the hospital. [facility] confirmed patient can come back when ready.   Assessment/plan status:  Psychosocial Support/Ongoing Assessment of Needs Other assessment/ plan:   Information/referral to community resources:   Pt returning to [facility].    PATIENT'S/FAMILY'S RESPONSE TO PLAN OF CARE: Pt was understanding and agreeable to CSW plan of care.       Jeanette Caprice, MSW, [place]

## 2014-03-14 NOTE — Progress Notes (Signed)
Family Medicine Teaching Service Daily Progress Note Intern Pager: [phone]  Patient name: [name] SlickerJudith Bohr Medical record number: [id number] Date of birth: 1976/11/14 Age: 38 y.o. Gender: female  Primary Care Provider: Marena ChancyLOSQ, STEPHANIE, MD Consultants: neurosurgery Code Status: Full  Pt Overview and Major Events to Date:  4/9: Abdominal pain; UCx & BCx; Vanc/Zosyn  4/11: UCx = re incubated for better growth; abdominal pain improved   Assessment and Plan: [name] [name] is a 38 y.o. female presenting with abdominal pain and fever, several days post-op for baclofen pump replacement.  PMH is significant for spastic CP (mentally intact), neurogenic bladder with indwelling suprapubic catheter, recurrent PE on Xarelto.   # Abdominal pain and fever:  UTI related vs post op pain from baclofen pump vs spastic bladder. Pt seen by neurosurgery in the ER who reportedly did not feel this was a surgical site infection. Afebrile since admission. flu negative - CT abdomen and pelvis: No acute abdominal or pelvic pathology. Large amount of stool in the rectosigmoid colon.  -  consider urology consult - IV fentanyl prn pain (has morphine allergy): still requiring IV fentanyl. Consider switching to oral pain meds tomorrow.  - afebrile since admission.  - blood culture from 03/10/14 at 11pm no growth - urine culture showing 45,000 colonies: SERRATIA MARCESCENS sensitive to cipro - ABx  Cipro (4/13>>)  CTX (4/12>>4/12)  Stopped vanc/zosyn (03/10/14 >> 4/12) - Enema today due to stool burden on CT  # Psych: hx of depression & OCD:  -continue home abilify, klonopin, lamictal, seroquel, zoloft, topamax  # CP/spasticity/chronic pain:  - Functional quadriparesis  -continue home baclofen, Vicodin prn (with prn fentanyl as well), tizanidine  # Recurrent PE: continue xarelto - anemia present with Hg: 9.9 stable from previous. No evidence of active bleeding - monitor and consider iron studies.   FEN/GI: soft  diet, SLIV  Prophylaxis: on xarelto   [facility] if tolerates transition to PO abx  Subjective:  Continues to endorse suprapubic pain, otherwise asymptomatic.   Objective: Temp:  [97.8 F (36.6 C)-98.7 F (37.1 C)] 97.9 F (36.6 C) (04/13 0536) Pulse Rate:  [72-85] 72 (04/13 0536) Resp:  [18] 18 (04/13 0536) BP: (99-123)/(50-69) 115/69 mmHg (04/13 0536) SpO2:  [96 %-97 %] 97 % (04/13 0536) Physical Exam: General: no acute distress, laying in bed, spastic contractures bilaterally Cardiovascular: S1S2, RRR, no murmur appreciated Respiratory: normal work of breathing, clear on anterior exam Abdomen: soft, tender in suprapubic region, no tenderness above incision and baclofen pump. Normal BS.  Extremities: no significant edema Skin: right abdominal incision intact and healing, surrounding superficial erythema  Laboratory:  Recent Labs Lab 03/12/14 0444 03/13/14 0530 03/14/14 0545  WBC 5.8 6.5 6.7  HGB 9.5* 9.9* 11.2*  HCT 30.4* 31.6* 36.1  PLT 271 291 339    Recent Labs Lab 03/10/14 1807  03/12/14 0444 03/13/14 0530 03/14/14 0545  NA 140  < > 143 143 144  K 3.8  < > 4.5 3.5* 3.9  CL 103  < > 110 108 105  CO2 22  < > 20 24 23   BUN 9  < > 6 6 7   CREATININE 0.45*  < > 0.46* 0.54 0.47*  CALCIUM 8.8  < > 8.7 8.8 9.5  PROT 6.9  --   --   --   --   BILITOT 0.2*  --   --   --   --   ALKPHOS 99  --   --   --   --  ALT 11  --   --   --   --   AST 15  --   --   --   --   GLUCOSE 121*  < > 95 86 89  < > = values in this interval not displayed.  Imaging/Diagnostic Tests: EXAM:  PORTABLE CHEST - 1 VIEW 03/10/14 COMPARISON: 07/19/2013  FINDINGS:  The cardiac silhouette, mediastinal and hilar contours are within  normal limits and stable. The lungs are clear. The bony thorax is  intact. Thoracolumbar scoliosis appears stable.  IMPRESSION:  No acute cardiopulmonary findings.   Wenda LowJames Tymothy Cass, MD 03/14/2014, 7:39 AM PGY-1 [facility]  [facility] FPTS Intern pager: [phone], text pages welcome

## 2014-03-14 NOTE — Progress Notes (Signed)
FMTS Attending Daily Note:  Renold DonJeff Elener Custodio MD  [phone] pager  Family Practice pager:  [phone] I have discussed this patient with the resident Dr. Gayla DossJoyner.  I agree with their findings, assessment, and care plan.  Still with significant amount of pain s/p bladder botox injections x 1 week and negative CT.  Plan to consult urology for any further rec's.

## 2014-03-15 ENCOUNTER — Non-Acute Institutional Stay (INDEPENDENT_AMBULATORY_CARE_PROVIDER_SITE_OTHER): Payer: Medicaid Other | Admitting: Family Medicine

## 2014-03-15 ENCOUNTER — Encounter: Payer: Self-pay | Admitting: Family Medicine

## 2014-03-15 DIAGNOSIS — R259 Unspecified abnormal involuntary movements: Secondary | ICD-10-CM

## 2014-03-15 DIAGNOSIS — R252 Cramp and spasm: Secondary | ICD-10-CM

## 2014-03-15 DIAGNOSIS — G809 Cerebral palsy, unspecified: Secondary | ICD-10-CM

## 2014-03-15 DIAGNOSIS — Z593 Problems related to living in residential institution: Secondary | ICD-10-CM

## 2014-03-15 DIAGNOSIS — Z9359 Other cystostomy status: Secondary | ICD-10-CM

## 2014-03-15 DIAGNOSIS — Z789 Other specified health status: Secondary | ICD-10-CM

## 2014-03-15 DIAGNOSIS — F429 Obsessive-compulsive disorder, unspecified: Secondary | ICD-10-CM

## 2014-03-15 DIAGNOSIS — Z9350 Unspecified cystostomy status: Secondary | ICD-10-CM

## 2014-03-15 DIAGNOSIS — N319 Neuromuscular dysfunction of bladder, unspecified: Secondary | ICD-10-CM

## 2014-03-15 DIAGNOSIS — G801 Spastic diplegic cerebral palsy: Secondary | ICD-10-CM

## 2014-03-15 DIAGNOSIS — I2699 Other pulmonary embolism without acute cor pulmonale: Secondary | ICD-10-CM

## 2014-03-15 LAB — URINE CULTURE: Colony Count: 45000

## 2014-03-15 MED ORDER — POLYETHYLENE GLYCOL 3350 17 G PO PACK: 17.0000 g | PACK | Freq: Two times a day (BID) | ORAL | Status: DC

## 2014-03-15 MED ORDER — FLEET ENEMA 7-19 GM/118ML RE ENEM
1.0000 | ENEMA | Freq: Once | RECTAL | Status: DC
  Filled 2014-03-15: qty 1

## 2014-03-15 MED ORDER — FLEET ENEMA 7-19 GM/118ML RE ENEM: 1.0000 | ENEMA | Freq: Every day | RECTAL | Status: DC | PRN

## 2014-03-15 MED ORDER — POLYETHYLENE GLYCOL 3350 17 G PO PACK
17.0000 g | PACK | Freq: Two times a day (BID) | ORAL | Status: DC
  Administered 2014-03-15: 17 g via ORAL
  Filled 2014-03-15 (×2): qty 1

## 2014-03-15 NOTE — Progress Notes (Signed)
Report called to [place] fund[facility]. Social work to [employer] [facility] for transportation. Pt's father updated per PT request.

## 2014-03-15 NOTE — Progress Notes (Signed)
FMTS Attending Daily Note:  Jeff Burdette Forehand MD  [phone] pager  Family Practice pager:  [phone] I have discussed this patient with the resident Dr. Joyner.  I agree with their findings, assessment, and care plan  

## 2014-03-15 NOTE — Progress Notes (Signed)
[facility] Patient Readmission History and Physical Service Pager: [phone]  Patient name: [name] [name] Medical record number: [id number] Date of birth: 1976-03-10 Age: 38 y.o. Gender: female  Primary Care Provider: Marena ChancyLOSQ, STEPHANIE, MD  Chief Complaint: Abdominal Pain leading to hospitalization  Assessment and Plan: [name] [name] is a 38 y.o. female presenting for readmission to [facility] after [facility] day admission [facility] for uncontrolled abdominal pain and fever (in patient with recent urine culture with pseudomonas, enterococcus so concern for pyelonephritis. PMH is significant for spastic CP (mentally intact), neurogenic bladder with indwelling suprapubic catheter, recurrent PE on Xarelto, several days post-op for baclofen pump replacement and approximately a week after intervesicular botox injections.   Abdominal pain: thought to be due to constipation given large stool burden on CT and no improvement with antibiotics for possible UTI/pyelonephritis. Surgical site for baclofen pump was evaluated by neurosurgery and surgical site did not appear infected. Patient was treated with 5 days of antibiotics for Serratia UTI with vanc/zosyn then converted to cipro x 1 day before being stopped at discharge. Patient was afebrile and without leukocytosis during admission so thought not likely pyelonephritis. Later, uncovered that also had pseudomonas in urine which was resistant to cipro and intermediate to zosyn (only sensitive to ceftazidime and cefepime as noted on prior urine culture) . Could also be related to endometriosis for which pain has been difficult to control in past with norco and tramadol.  -given patient afebrile will monitor q shift vitals. If febrile, would place on minimum 10 day course of cefepime or ceftazadime. Asked nursing staff to call if this occurs.  -possibly related to neurogenic bladder and painful spasms (difficult to  tease at urinary symptoms due to UTI vs. Spasms). Monitor for now.  -regarding pain due to constipation, patient on miralax and colace BID. Desire daily BM and if none, will use enema as per orders. Discused with nursing staff to not hold for diarrhea as may be overflow diarrhea.  -will deescalate narcotics to what she was on after procedure with Norco q 4 hours (do not want to increase as may worsen constipation.    Psych: hx of depression & OCD: Continued home abilify, klonopin, lamictal, seroquel, zoloft, topamax   CP/spasticity/chronic pain: Continued home baclofen (per pump), hydrocodone, tizanidine. Will not continue IV fentanyl given as inpatient.    Recurrent PE: continued xarelto  ________________________________________________________________________________________________________________________________________       History of Present Illness: [name] [name] is a 38 y.o. female [facility] after a 5 day hospitalization for abdominal pain. Patient was initially sent to ED given febrile to 101.3, uncontrolled abdominal pain, recent botox injections intervesicular, and recent baclofen pump replacement in patient with Urine culture a week before with enterococcus, pseudomonas which was resistant to everything but ceftazidime and cefepime and serratia with concern for pyelonephritis vs. Infection at site of baclofen pump replacement.   Upon arrival to ED, intial labwork was unrevealing for infection site. Urine cultures (off suprapubic cath which was replaced before sending to ED) and Blood cultures were sent. Vanc and zosyn was started given old urine culture was over a week old and from unknown source so unclear if would have same resistance patterns. She was seen by neurosurgery in the ER who reportedly did not feel this was a surgical site infection. Patient had continued abdominal pain during her stay which was thought likely related to constipation +/-  UTI after CT abd/pelvis showed large stool burden.  She was given enema with a good response and [facility] to continue ongoing management of chronic pain and chronic constipation. Urine culture initially showed 45,000 colonies of SERRATIA MARCESCENS and antibiotics were narrowed to Ciprofloxacin than discontinued on discharge as her pain felt to be due to constipation evident on CT abdomen. After discharge, I spoke with primary inpatient team about Pseudomonas in urine which was not mentioned on discharge paperwork. This result apparently just became available the morning before discharge. Pseudomonas was resistant to everything but ceftazadime and cefepime and intermediate to zosyn.   All other chronic illnesses were manapged in hospital as per in nursing home 9see assessment and plan above.   Upon return to the nursing home, patient was tired/sedated from recent dose of narcotic pain medication. She complain of minimal abdominal pain and denied fevers/chills/CVA tenderness. Abdominal pain was diffuse.   Review Of Systems: Per HPI, otherwise negative with the following additions: no nausea/vomiting. Some bladder spasms similar to before botox injection. Otherwise 12 point review of systems was performed and was unremarkable.  Patient Active Problem List   Diagnosis Date Noted  . Sepsis 03/10/2014  . Baclofen pump failure 03/07/2014  . Spasticity 01/05/2014  . Nursing home resident 09/16/2013  . Dental caries 07/18/2013  . Multiple thyroid nodules 05/20/2013  . Osteoarthrosis 04/07/2013  . Anemia 04/07/2013  . [name]   . Dysarthria   . GERD (gastroesophageal reflux disease)   . Gross hematuria 03/12/2013  . Neurogenic bladder 02/05/2013  . Anxiety state 10/21/2012  . History of brain disorder 10/21/2012  . Healed or old pulmonary embolism 10/21/2012  . Chronic pain syndrome 06/17/2012  . Abdominal pain 01/02/2012  . Cerebral palsied 10/25/2011   . Recurrent pulmonary embolism 05/03/2011  . PE (pulmonary embolism) 05/03/2011  . Chronic suprapubic catheter 03/15/2011  . Status of artificial opening of urinary tract 03/15/2011  . Retention of urine 03/15/2011  . Seborrheic keratosis 01/18/2011  . Long term (current) use of anticoagulants 01/12/2011  . Disease of female genital organs 10/24/2010  . WRIST PAIN, LEFT 03/20/2010  . HYPERTENSION, BENIGN 01/31/2010  . CP (cerebral palsy), spastic 12/27/2008  . Contracted, joint, multiple sites 12/27/2008  . DEPRESSION, MAJOR, RECURRENT 01/29/2007  . OBSESSIVE COMPUL. DISORDER 01/29/2007  . Infantile cerebral palsy 01/29/2007  . Anancastic neurosis 01/29/2007   Past Medical History: Past Medical History  Diagnosis Date  . Cerebral palsy   . Pulmonary embolism     Lifetime Coumadin  . Cerebral palsy     Spastic Cerebral palsy, mentally intact  . Contracture, joint, multiple sites     Electric wheelchair, uses left hand to operate chair.   [name] Kitchen. Hypertension   . OCD (obsessive compulsive disorder)   . Depression   . Migraine   . [name]   . Dysarthria   . Endometriosis   . History of recurrent UTIs   . GERD (gastroesophageal reflux disease)   . Pulmonary embolism 2011, 01/2011    Will be on lifetime coumadin  . Anemia   . Abdominal pain 01/02/2012    Overview:  Overview:  Per Previous pcp note- Dr. Ta--Pt has history of endometriosis and had DUB in 2011.  She She had u/s that showed normal endometrial stripe.  She had endometrial bx that was wnl.  She has a lot of abd cramping every month.  This cramping was sometimes not relieved with hydrocodone.  She [facility] for IUD placement to  help endometriosis and abd cramping.  [name]  . Macroscopic hematuria 03/10/2013    status post replacement of suprapubic tube 03/04/2013   . HYPERTENSION, BENIGN 01/31/2010    Qualifier: Diagnosis of  By: [name], MD, [name]    Past Surgical  History: Past Surgical History  Procedure Laterality Date  . Colposcopy  06/2000  . Cesarean section      x 2  . Tubal ligation  2003  . Carpal tunnel release  08/2008    Dr Teressa Senter  . Wrist surgery  06/2010    Dr Dierdre Searles, hand surgeon, [name]  . Baclofen pump refill      x 3 times  . Appendectomy    . Intrauterine device insertion  04/30/11    Inserted by [facility] for endometriosis  . Laparoscopically assisted vag hysterectomy  10/27/2012  . Multiple extractions with alveoloplasty Bilateral 07/19/2013    Procedure: EXTRACTIONS #4, 1,61,09,60;  Surgeon: Francene Finders, DDS;  Location[facility] OR;  Service: Oral Surgery;  Laterality: Bilateral;  . Pain pump implantation N/A 03/07/2014    Procedure: baclofen pump revision/replacement and Catheter connection replacement;  Surgeon: Cristi Loron, MD;  Location: [facility] OR[facility];  Service: Neurosurgery;  Laterality: N/A;  baclofen pump revision/replacement and Catheter connection replacement   Social History: History  Substance Use Topics  . Smoking status: Never Smoker   . Smokeless tobacco: Never Used  . Alcohol Use: 0.6 oz/week    1 Glasses of wine per week     Comment: Drinks alcohol once a month.   Additional social history: lives in [place] nursing home  Please also refer to relevant sections of EMR.  Family History: Family History  Problem Relation Age of Onset  . Asthma Father   . Colon cancer Maternal Grandmother     Died in her 57's  . Cancer Paternal Grandmother   . Heart attack Maternal Grandfather     Died in his 60's  . Breast cancer Paternal Grandmother     Died in her 36's  . Alzheimer's disease Paternal Grandfather     Died in his 3's   Allergies and Medications: Allergies  Allergen Reactions  . Morphine Dermatitis    Skin turned red   Current Outpatient Prescriptions on File Prior to Visit  Medication Sig Dispense Refill  . acetaminophen (TYLENOL) 325 MG tablet Take 2 tablets (650 mg total) by  mouth every 8 (eight) hours.  90 tablet  0  . acetic acid (VOSOL) 2 % otic solution Place 4 drops into both ears once a week.      . ARIPiprazole (ABILIFY) 5 MG tablet Take 5 mg by mouth every morning.       . bacitracin-polymyxin b (POLYSPORIN) ointment Apply topically 2 (two) times daily as needed (at cath site for redness, discharge).      . baclofen (LIORESAL) 10 MG tablet Take 2 tablets (20 mg total) by mouth 4 (four) times daily.  30 each  0  . clonazePAM (KLONOPIN) 1 MG tablet Take 1 tablet (1 mg total) by mouth 4 (four) times daily.  120 tablet  2  . diazepam (VALIUM) 5 MG tablet Take 1 tablet (5 mg total) by mouth every 6 (six) hours as needed for muscle spasms.  1 tablet  0  . docusate sodium (COLACE) 100 MG capsule Take 100 mg by mouth 2 (two) times daily.      [name] Kitchen HYDROcodone-acetaminophen (NORCO/VICODIN) 5-325 MG per tablet Take 1-2 tablets by mouth  every 6 (six) hours as needed for severe pain.  30 tablet  0  . ibuprofen (ADVIL,MOTRIN) 200 MG tablet Take 400 mg by mouth every 8 (eight) hours as needed for pain.      [name] Kitchen lamoTRIgine (LAMICTAL) 200 MG tablet Take 200 mg by mouth daily.       [name] Kitchen lidocaine (LIDODERM) 5 % Place 1 patch onto the skin daily. Remove & Discard patch within 12 hours or as directed by MD      . loratadine (CLARITIN) 10 MG tablet Take 10 mg by mouth daily as needed for allergies.      . Multiple Vitamin (MULTIVITAMIN WITH MINERALS) TABS Take 1 tablet by mouth every evening.       . polyethylene glycol (MIRALAX / GLYCOLAX) packet Take 17 g by mouth 2 (two) times daily.  14 each  0  . QUEtiapine (SEROQUEL) 100 MG tablet Take 100 mg by mouth at bedtime.      . Rivaroxaban (XARELTO) 20 MG TABS Take 1 tablet (20 mg total) by mouth daily with supper.  30 tablet    . sertraline (ZOLOFT) 100 MG tablet Take 200 mg by mouth at bedtime.       . sodium phosphate (FLEET) 7-19 GM/118ML ENEM Place 133 mLs (1 enema total) rectally daily as needed for severe constipation.  30 enema  0   . SUMAtriptan (IMITREX) 100 MG tablet Take 100 mg by mouth every 2 (two) hours as needed for migraine.       [name] Kitchen tiZANidine (ZANAFLEX) 4 MG capsule Take 1 capsule (4 mg total) by mouth 3 (three) times daily.  30 capsule  0  . topiramate (TOPAMAX) 100 MG tablet Take 100 mg by mouth at bedtime.        Current Facility-Administered Medications on File Prior to Visit  Medication Dose Route Frequency Provider Last Rate Last Dose  . 0.9 %  sodium chloride infusion  1,000 mL Intravenous Continuous Tobey Grim, MD 20 mL/hr at 03/12/14 1734 1,000 mL at 03/12/14 1734  . acetaminophen (TYLENOL) tablet 650 mg  650 mg Oral Q8H PRN Latrelle Dodrill, MD      . ARIPiprazole (ABILIFY) tablet 5 mg  5 mg Oral q morning - 10a Latrelle Dodrill, MD   5 mg at 03/15/14 1115  . baclofen (LIORESAL) tablet 20 mg  20 mg Oral QID Latrelle Dodrill, MD   20 mg at 03/15/14 1332  . ciprofloxacin (CIPRO) tablet 250 mg  250 mg Oral BID Wenda Low, MD   250 mg at 03/15/14 0454  . clonazePAM (KLONOPIN) tablet 1 mg  1 mg Oral QID Latrelle Dodrill, MD   1 mg at 03/15/14 1332  . diazepam (VALIUM) tablet 5 mg  5 mg Oral Q6H PRN Latrelle Dodrill, MD      . diphenhydrAMINE (BENADRYL) capsule 25 mg  25 mg Oral Q6H PRN Latrelle Dodrill, MD      . docusate sodium (COLACE) capsule 100 mg  100 mg Oral BID Wenda Low, MD   100 mg at 03/15/14 1115  . HYDROcodone-acetaminophen (NORCO/VICODIN) 5-325 MG per tablet 1-2 tablet  1-2 tablet Oral Q6H PRN Latrelle Dodrill, MD   1 tablet at 03/15/14 1115  . lamoTRIgine (LAMICTAL) tablet 200 mg  200 mg Oral Daily Latrelle Dodrill, MD   200 mg at 03/15/14 1114  . lidocaine (LIDODERM) 5 % 1 patch  1 patch Transdermal Q24H Latrelle Dodrill, MD   1  patch at 03/15/14 1116  . loratadine (CLARITIN) tablet 10 mg  10 mg Oral Daily PRN Latrelle Dodrill, MD   10 mg at 03/12/14 1006  . polyethylene glycol (MIRALAX / GLYCOLAX) packet 17 g  17 g Oral BID Wenda Low, MD   17 g at  03/15/14 1000  . QUEtiapine (SEROQUEL) tablet 100 mg  100 mg Oral QHS Latrelle Dodrill, MD   100 mg at 03/14/14 2202  . Rivaroxaban (XARELTO) tablet 20 mg  20 mg Oral Q supper Latrelle Dodrill, MD   20 mg at 03/14/14 1628  . sertraline (ZOLOFT) tablet 200 mg  200 mg Oral QHS Latrelle Dodrill, MD   200 mg at 03/14/14 2202  . sodium chloride 0.9 % injection 3 mL  3 mL Intravenous Q12H Latrelle Dodrill, MD   3 mL at 03/15/14 1117  . sodium phosphate (FLEET) 7-19 GM/118ML enema 1 enema  1 enema Rectal Once Anselm Lis, MD      . SUMAtriptan (IMITREX) tablet 100 mg  100 mg Oral Q2H PRN Stephanie [address], MD   100 mg at 03/13/14 2248  . tiZANidine (ZANAFLEX) tablet 4 mg  4 mg Oral TID Tobey Grim, MD   4 mg at 03/15/14 1115  . topiramate (TOPAMAX) tablet 100 mg  100 mg Oral QHS Latrelle Dodrill, MD   100 mg at 03/14/14 2202  . triamcinolone cream (KENALOG) 0.5 %   Topical TID Tobey Grim, MD        Objective: LMP 04/05/2011 VS had not been obtained from nursing staff at time of my H+P. RR was 16 on my exam and HR was 80.  Exam: General: will awaken to voice, but easily falls back to sleep HEENT: Mucous membranes are moist. Cardiovascular: regular rate and rhythm no murmurs rubs or gallops Respiratory: clear to auscultation bilaterally, no murmurs rubs or gallops Abdomen: mild diffuse tenderness without reboudn or guarding. Baclofen pump palpable in RUQ and covered with adhesive dressing Extremities: no edema, contractures noted Skin: warm, dry Neuro: oriented to location and who I am   Labs and Imaging: CBC BMET   Recent Labs Lab 03/14/14 0545  WBC 6.7  HGB 11.2*  HCT 36.1  PLT 339    Recent Labs Lab 03/14/14 0545  NA 144  K 3.9  CL 105  CO2 23  BUN 7  CREATININE 0.47*  GLUCOSE 89  CALCIUM 9.5     Imaging/Diagnostic Tests (full reports available in epic):   PORTABLE CHEST - 1 VIEW 03/10/14  COMPARISON: 07/19/2013  FINDINGS:  The cardiac  silhouette, mediastinal and hilar contours are within  normal limits and stable. The lungs are clear. The bony thorax is  intact. Thoracolumbar scoliosis appears stable.  IMPRESSION:  No acute cardiopulmonary findings.    CT ab 4/12  IMPRESSION:  1. No acute abdominal or pelvic pathology.  2. Large amount of stool in the rectosigmoid colon.  3. Trace right pleural effusion with atelectasis.  4. Suprapubic catheter in satisfactory position.   Shelva Majestic, MD 03/15/2014, 4:38 PM PGY-3, [facility] FPTS Intern pager: [phone], text pages welcome

## 2014-03-15 NOTE — Discharge Instructions (Signed)
Ms [name] [name] you were hospitalized for abdominal pain. It is felt that your pain is being caused by constipation which is a common side-effect of several of your mediations. You need to continue Miralax and Colace, and may need occasional enema to help with this problem. You were also given antibiotics in case a UTI was causing your problems, but since your pain did not improve with antibiotic and you had no fevers and no other signs of infection this is less likely.

## 2014-03-15 NOTE — Progress Notes (Signed)
Family Medicine Teaching Service Daily Progress Note Intern Pager: [phone]  Patient name: [name] SlickerJudith Walls Medical record number: [id number] Date of birth: 1976/09/17 Age: 38 y.o. Gender: female  Primary Care Provider: Marena ChancyLOSQ, STEPHANIE, MD Consultants: neurosurgery Code Status: Full  Pt Overview and Major Events to Date:  4/9: Abdominal pain; UCx & BCx; Vanc/Zosyn  4/11: UCx = re incubated for better growth; abdominal pain improved 4/13: Switched to Cipro, remains afebrile   Assessment and Plan: [name] SlickerJudith [name] is a 38 y.o. female presenting with abdominal pain and fever, several days post-op for baclofen pump replacement.  PMH is significant for spastic CP (mentally intact), neurogenic bladder with indwelling suprapubic catheter, recurrent PE on Xarelto.   # Abdominal pain; Likely constipation vs possible UTI. Pt seen by neurosurgery in the ER who reportedly did not feel this was a surgical site infection. Afebrile since admission. flu negative - CT abdomen and pelvis: No acute abdominal or pelvic pathology. Large amount of stool in the rectosigmoid colon. - Switching back to all oral pain meds  - blood culture from 03/10/14 at 11pm no growth - urine culture showing 45,000 colonies: SERRATIA MARCESCENS sensitive to cipro - ABx  Cipro (4/13>>)  CTX (4/12>>4/12)  Stopped vanc/zosyn (03/10/14 >> 4/12) - Had good BM after enema; will increase Miralax to BID  # Psych: hx of depression & OCD:  -continue home abilify, klonopin, lamictal, seroquel, zoloft, topamax  # CP/spasticity/chronic pain:  - Functional quadriparesis  -continue home baclofen, Vicodin prn (with prn fentanyl as well), tizanidine  # Recurrent PE: continue xarelto - anemia present with Hg: 9.9 stable from previous. No evidence of active bleeding - monitor and consider iron studies.   FEN/GI: soft diet, SLIV  Prophylaxis: on xarelto   Disposition: [facility] to [facility] to continue with bowel regimen  Subjective:   Continues to endorse abdominal pain, and complains that she is not receiving enough pain medications. Per nursing, she request to be given her pain medications on a scheduled basis - even over night when sleeping.   Objective: Temp:  [97.4 F (36.3 C)-98.3 F (36.8 C)] 97.4 F (36.3 C) (04/14 0415) Pulse Rate:  [66-73] 73 (04/14 0407) Resp:  [18-20] 20 (04/14 0407) BP: (86-102)/(43-51) 102/46 mmHg (04/14 0407) SpO2:  [90 %-97 %] 95 % (04/14 0407) Physical Exam: General: no acute distress, laying in bed, spastic contractures bilaterally Cardiovascular: S1S2, RRR, no murmur appreciated Respiratory: normal work of breathing, clear on anterior exam Abdomen: soft, tender in suprapubic region, no tenderness above incision and baclofen pump. Normal BS.  Extremities: no significant edema Skin: right abdominal incision intact and healin  Laboratory:  Recent Labs Lab 03/12/14 0444 03/13/14 0530 03/14/14 0545  WBC 5.8 6.5 6.7  HGB 9.5* 9.9* 11.2*  HCT 30.4* 31.6* 36.1  PLT 271 291 339    Recent Labs Lab 03/10/14 1807  03/12/14 0444 03/13/14 0530 03/14/14 0545  NA 140  < > 143 143 144  K 3.8  < > 4.5 3.5* 3.9  CL 103  < > 110 108 105  CO2 22  < > 20 24 23   BUN 9  < > 6 6 7   CREATININE 0.45*  < > 0.46* 0.54 0.47*  CALCIUM 8.8  < > 8.7 8.8 9.5  PROT 6.9  --   --   --   --   BILITOT 0.2*  --   --   --   --   ALKPHOS 99  --   --   --   --  ALT 11  --   --   --   --   AST 15  --   --   --   --   GLUCOSE 121*  < > 95 86 89  < > = values in this interval not displayed.  Imaging/Diagnostic Tests: EXAM:  PORTABLE CHEST - 1 VIEW 03/10/14 COMPARISON: 07/19/2013  FINDINGS:  The cardiac silhouette, mediastinal and hilar contours are within  normal limits and stable. The lungs are clear. The bony thorax is  intact. Thoracolumbar scoliosis appears stable.  IMPRESSION:  No acute cardiopulmonary findings.  CT ab 4/12 IMPRESSION:  1. No acute abdominal or pelvic pathology.  2.  Large amount of stool in the rectosigmoid colon.  3. Trace right pleural effusion with atelectasis.  4. Suprapubic catheter in satisfactory position.    Wenda LowJames Hobie Kohles, MD 03/15/2014, 8:24 AM PGY-1 [facility] FPTS Intern pager: [phone], text pages welcome

## 2014-03-15 NOTE — Progress Notes (Signed)
MD paged and aware of pt's temp. Orders to still discharge patient. Will continue to monitor.

## 2014-03-15 NOTE — Progress Notes (Signed)
Clinical Social Worker facilitated patient discharge by contacting the patient and facility, [facility]. Patient agreeable to this plan and arranging transport via EMS . CSW will sign off, as social work intervention is no longer needed.  Jeilyn Reznik, MSW, LCSWA [phone]  

## 2014-03-16 ENCOUNTER — Encounter: Payer: Self-pay | Admitting: Family Medicine

## 2014-03-16 ENCOUNTER — Non-Acute Institutional Stay: Payer: Medicaid Other | Admitting: Family Medicine

## 2014-03-16 DIAGNOSIS — A419 Sepsis, unspecified organism: Secondary | ICD-10-CM

## 2014-03-16 DIAGNOSIS — N390 Urinary tract infection, site not specified: Secondary | ICD-10-CM | POA: Insufficient documentation

## 2014-03-16 DIAGNOSIS — Z978 Presence of other specified devices: Secondary | ICD-10-CM

## 2014-03-16 DIAGNOSIS — K5900 Constipation, unspecified: Secondary | ICD-10-CM | POA: Insufficient documentation

## 2014-03-16 DIAGNOSIS — Z9889 Other specified postprocedural states: Secondary | ICD-10-CM

## 2014-03-16 LAB — CULTURE, BLOOD (ROUTINE X 2)
Culture: NO GROWTH
Culture: NO GROWTH

## 2014-03-16 MED ORDER — OXYCODONE-ACETAMINOPHEN 7.5-325 MG PO TABS: 1.0000 | ORAL_TABLET | Freq: Four times a day (QID) | ORAL | Status: DC | PRN

## 2014-03-16 MED ORDER — RA SALINE ENEMA 19-7 GM/118ML RE ENEM: 1.0000 "application " | ENEMA | Freq: Every day | RECTAL | Status: AC

## 2014-03-16 NOTE — Discharge Summary (Signed)
Family Medicine Teaching Service  Discharge Note : Attending Jeff Jelene Albano MD Pager [phone] Inpatient Team Pager:  [phone]  I have reviewed this patient and the patient's chart and have discussed discharge planning with the resident at the time of discharge. I agree with the discharge plan as above.    

## 2014-03-16 NOTE — Assessment & Plan Note (Signed)
A: UTI due to serratia and pseudomonas. Serratia treated. Pseudomonas was not treated. P: Monitor for fever, N/V, s/s of acute infection If present will evaluate with repeat UA and UCX while covering for pseudomans was cefepime.

## 2014-03-16 NOTE — Assessment & Plan Note (Signed)
A: constipation with abdominal pain.  P: Saline laxative enema x 3 days Percocet 7.5/325 q 6 hr prn pain  F/u abdominal exam and stool output on 03/18/14.

## 2014-03-16 NOTE — Progress Notes (Signed)
  Attending H&P Subjective:    Patient ID: [name] SlickerJudith [name], female    DOB: 1975-12-21, 38 y.o.   MRN: [id number]  HPI 38 yo F patient with CP, neurogenic bladder, chronic suprapubic foley seen in f/u of return from hospitalization. She was hospitalized from 03/10/14 to 03/15/14 for abdominal pain.   She was treated for serratia UTI. [name] [name] culture grew out serratia and pseudomonas (sensitive to Cefepime and Ceftazidime only).   She was also treated for large stool burden with enema x one on 03/14/14.   Today she reports persistent abdominal pain. Small amount of bowel movement. She denies nausea, emesis, fever. She is requesting percocet for pain control. She is amenable to enemas. She does not want suppositories.   She also endorses passing some urine from her urethra into her diaper.   Review of Systems As per HPI     Objective:   Physical Exam BP 109/59  Pulse 70  Temp(Src) 98.4 F (36.9 C)  Resp 18  SpO2 93%  LMP 04/05/2011 General appearance: alert, cooperative and no distress, lying in bed. B/l hand contractures.  Lungs: clear to auscultation bilaterally Heart: regular rate and rhythm, S1, S2 normal, no murmur, click, rub or gallop Abdomen: round, soft, TTP b/l LQ w/o rebound or guarding. Hard mass R periumbical area conssiten with  baclofen pump. Bruising overlying this area.  Extremities: no rash or edema     Assessment & Plan:

## 2014-03-16 NOTE — Assessment & Plan Note (Signed)
Resolved. Treated for serratia UTI.

## 2014-03-17 ENCOUNTER — Non-Acute Institutional Stay (INDEPENDENT_AMBULATORY_CARE_PROVIDER_SITE_OTHER): Payer: Medicaid Other | Admitting: Family Medicine

## 2014-03-17 ENCOUNTER — Encounter: Payer: Self-pay | Admitting: Family Medicine

## 2014-03-17 DIAGNOSIS — R509 Fever, unspecified: Secondary | ICD-10-CM

## 2014-03-17 HISTORY — PX: PROGRAMABLE BACLOFEN PUMP REVISION: SHX2268

## 2014-03-17 NOTE — Progress Notes (Signed)
[name] Patient [name] SlickerJudith [name] 38 y.o. female  MRN: [id number]  DOB: 1975/12/19 PCP: [name] SkinnerStephanie E Losq, MD      Situation:  Wanted to see doctor given temperature of 99.4 earlier today     Subjective:  Patient states still having spasms of bladder which are not atypical. No pain over site of baclofen replacement.  ROS-no chest pain, shortness of breath or cough, no nausea/vomiting/diarrhea.      History:  Recently hospitalized for uncontrolled abdominal pain after baclofen pump replacement. Thought to have constipation as cause based off CT. Did have 1 fever in NH but was afebrile during 5 day hospital stay for which she was treated for serratia UTI but not for pseudomonas which showed on urine culture on day of discharge.      Vitals:  BP 109/59  Pulse 76  Temp(Src) 99.4 F (37.4 C)  Resp 16  LMP 04/05/2011     Physical Exam: General: NAD, resting in bed, spastic quadriplegic  HEENT: Mucous membranes are moist.  Cardiovascular: regular rate and rhythm no murmurs rubs or gallops  Respiratory: clear to auscultation bilaterally, no murmurs rubs or gallops  Abdomen: very mild tenderness in suprapubic area without rebound or guarding. Baclofen pump palpable in RUQ and covered, incision healing well  Extremities: no edema, contractures noted  Neuro: oriented to location and who I am, speech difficult to understand as per baseline    Assessment & Plan:  Elevated temperature Asked nursing staff to continue to monitor, would need to consider IV line and treating with cefepime or ceftazadime and treating if febrile given recent Urine culture for pseudomonas (off newly replaced suprapubic catheter at the time). Would use 100.0 as cut off as patient may not mount a normal immune response.

## 2014-03-18 ENCOUNTER — Telehealth: Payer: Self-pay | Admitting: Family Medicine

## 2014-03-18 ENCOUNTER — Other Ambulatory Visit: Payer: Self-pay | Admitting: Family Medicine

## 2014-03-18 ENCOUNTER — Non-Acute Institutional Stay (INDEPENDENT_AMBULATORY_CARE_PROVIDER_SITE_OTHER): Payer: Medicaid Other | Admitting: Family Medicine

## 2014-03-18 DIAGNOSIS — N390 Urinary tract infection, site not specified: Secondary | ICD-10-CM

## 2014-03-18 MED ORDER — OXYCODONE-ACETAMINOPHEN 7.5-325 MG PO TABS: 1.0000 | ORAL_TABLET | Freq: Four times a day (QID) | ORAL | Status: DC | PRN

## 2014-03-18 NOTE — Progress Notes (Signed)
[facility] Patient [name] SlickerJudith [name] 38 y.o. female  MRN: [id number]  DOB: 1976/09/24 PCP: Lonia SkinnerStephanie E Losq, MD   Situation:  Elevated temperature yesterday in patient with recent untreated pseudomonas on urine culture during hospitalization  Subjective:  Patient now complaining of mild nausea. States temperature up to 100.3 again.  ROS-no chest pain, shortness of breath or cough, no nausea/vomiting/diarrhea .  History:  Recently hospitalized for uncontrolled abdominal pain after baclofen pump replacement. Thought to have constipation as cause based off CT. Did have 1 fever in NH but was afebrile during 5 day hospital stay for which she was treated for serratia UTI but not for pseudomonas which showed on urine culture on day of discharge.  Vitals:  Tm 100.2 (on chart review) VSS   Physical Exam:  General: NAD, resting in bed, spastic quadriplegic  HEENT: Mucous membranes are moist.  Cardiovascular: regular rate and rhythm no murmurs rubs or gallops  Respiratory: clear to auscultation bilaterally, no murmurs rubs or gallops  Abdomen: very mild tenderness in suprapubic area without rebound or guarding. Baclofen pump palpable in RUQ and covered, incision healing well  Extremities: no edema, contractures noted  Neuro: oriented to location and who I am, where she is, current situation, president speech difficult to understand as per baseline   Assessment & Plan:  UTI Recent Urine culture positive for pseudomonas (off newly replaced suprapubic catheter at the time). Given febrile to 100.2 overnight, will start treatment for 10 days with Cefepime 1g IM q 12 hours. Monitor vital signs q shift over weekend. Per Dr. Armen PickupFunches request, will take out suprapubic catheter, replace and obtain UA and Urine Culture. Cefepime order called in as wanted to discuss dosing with pharmacy team first who approved. Also zofran prn for nausea.

## 2014-03-18 NOTE — Telephone Encounter (Signed)
Received call from nursing [facility]. When call was returned the nurse stated she found what she needed and did not have anything to discuss with me.   [name] AlarEric Sonnenberg, MD

## 2014-03-18 NOTE — Telephone Encounter (Signed)
Sent in percocet # 20. Refilled med [facility]

## 2014-03-21 ENCOUNTER — Non-Acute Institutional Stay (INDEPENDENT_AMBULATORY_CARE_PROVIDER_SITE_OTHER): Payer: Medicaid Other | Admitting: Family Medicine

## 2014-03-21 ENCOUNTER — Encounter: Payer: Self-pay | Admitting: Family Medicine

## 2014-03-21 DIAGNOSIS — N390 Urinary tract infection, site not specified: Secondary | ICD-10-CM

## 2014-03-21 NOTE — Progress Notes (Signed)
[facility] Patient [name] SlickerJudith Gotcher 38 y.o. female  MRN: [id number]  DOB: 13-Nov-1976 PCP: Lonia SkinnerStephanie E Losq, MD   Situation:  Started for treatment of pseudomonal UTI on Friday.   Subjective:  Nausea has resolved. Highest temperature was 99.3 over weekend. Patient has felt much better and was out of facility yesterday and going to [employer] today. Patient does report a decrease in urine output though she nor nursing staff can quantify. Patient does not remember q shift flushes but staff states these have been completed.  States she is eating and drinking well. Has had bowel movement each day over the weekend. Very minimal suprapubic pain.  ROS-no chest pain, shortness of breath or cough, no nausea/vomiting/diarrhea.    Vitals:  BP 101/54  Pulse 84  Temp(Src) 97.2 F (36.2 C)  Resp 18  LMP 04/05/2011   Physical Exam:  General: NAD, driving around in her chair, spastic quadriplegic  HEENT: Mucous membranes are moist.  Cardiovascular: regular rate and rhythm no murmurs rubs or gallops  Respiratory: clear to auscultation bilaterally, no murmurs rubs or gallops  Abdomen: very mild tenderness in suprapubic area without rebound or guarding. Baclofen pump palpable in RUQ and covered, incision healing well  Extremities: no edema, contractures noted  Neuro: alert and oriented x 4  UA from 03/19/14 with amber color, spec grav >1.03, small bilirubin, large blood, >300 protein, small LE. Micro showed no bacteria but >50 WBC, >50 RBC.   Urine culture pending.   Assessment & Plan:  UTI Day 3/10 of treatment with cefepime IM. Patient appears to feel much better and is more active as a results.  No culture results today. Order does not show pending culture either but instead says "Urinalysis w micro rflx cult". Will monitor for now to see if culture comes in. Given Hematuria, repeat UA after treatment would also be reasonable but could be simply due to mild  trauma during suprapubic cath placement.  May need some IV fluids but patient hesitant to allow IV stick at [facility]. Will forward to night team in case Cr comes back very elevated and patient resists IV attempt.

## 2014-03-23 ENCOUNTER — Encounter: Payer: Self-pay | Admitting: Family Medicine

## 2014-03-23 NOTE — Progress Notes (Signed)
[facility] Patient [name] SlickerJudith [name] 38 y.o. female  MRN: [id number]  DOB: 15-Nov-1976 PCP: Lonia SkinnerStephanie E Losq, MD   Situation:  Started for treatment of pseudomonal UTI on Friday 4/17. Planned f/u.   Subjective:  Has been much more active and even went to [employer] on Monday. Urination has improved with regular flushes. Still has mild abdominal pain. Has had daily bowel movements. No further fevers.  ROS-no chest pain, shortness of breath or cough, no nausea/vomiting/diarrhea.    Vitals:  BP 106/72  Pulse 90  Temp(Src) 97.9 F (36.6 C)  Resp 16  LMP 04/05/2011   Physical Exam:  General: NAD, sleeping in bed and easily awakens. c  HEENT: Mucous membranes are moist.  Abdomen: very mild tenderness in suprapubic area without rebound or guarding. Baclofen pump palpable in RUQ and covered, incision healing well  Extremities: no edema, contractures noted  Neuro: alert and oriented x 4  Urine culture with pseudomonas similar to prior (only sensitive to cefepime and ceftazadime)  Assessment & Plan:  Pseudomonal UTI Day 5/10 of treatment with cefepime IM. No fevers and stable vitals.  Still with some abdominal pain-still cleaning out from constipation.  BMET showed creatinine of 0.5 (same as 1 week ago so not entered). Not sure what cause of decreased urination was but with flushes, has normalized.

## 2014-03-24 ENCOUNTER — Encounter: Payer: Medicaid Other | Admitting: Pediatrics

## 2014-03-24 ENCOUNTER — Encounter: Payer: Self-pay | Admitting: Family Medicine

## 2014-03-24 ENCOUNTER — Non-Acute Institutional Stay (INDEPENDENT_AMBULATORY_CARE_PROVIDER_SITE_OTHER): Payer: Medicaid Other | Admitting: Family Medicine

## 2014-03-24 DIAGNOSIS — N390 Urinary tract infection, site not specified: Secondary | ICD-10-CM

## 2014-03-24 NOTE — Progress Notes (Signed)
[facility] Patient [name] SlickerJudith [name] 38 y.o. female  MRN: [id number]  DOB: November 23, 1976 PCP: Lonia SkinnerStephanie E Losq, MD   Situation:  Started for treatment of pseudomonal UTI on Friday. Called by nursing for temperature of 100.5 and patient wanting to talk to me  Subjective:  Temperature checked about 10 minutes later at 100.8 per patient request. She is covered in blankets and states she had been chilled earlier and covered herself. Also has extra sweater on. Urine output normal. Abdominal pain stable. No diarrhea, daily bowel movements. No cough or shortness of breath   Vitals:  BP 139/79  Pulse 96  Temp(Src) 100.8 F (38.2 C)  Resp 16  LMP 04/05/2011   Physical Exam:  General: NAD, resting in bed with many covers on  HEENT: Mucous membranes are moist.  Cardiovascular: regular rate and rhythm no murmurs rubs or gallops  Respiratory: clear to auscultation bilaterally, no murmurs rubs or gallops  Abdomen: very mild tenderness in suprapubic area without rebound or guarding. No CVA tenderness. Baclofen pump palpable in RUQ and covered, incision healing well  Extremities: no edema, contractures noted  Neuro: alert and oriented x 4  Urine culture with pseudomonas similar to prior (only sensitive to cefepime and ceftazadime)  Assessment & Plan:  Pseudomonal UTI  Day 6/10 of treatment with cefepime IM. Stable vitals but febrile after febrile x 4 days. Doubt drug fever. Abdominail pain stable. Had CT abdomen in hospital with only stool burden.   Will continue to monitor and if febrile tomorrow or if vitals unstable overnight would order blood culture, cbc with diff, cmet, and CXR.

## 2014-03-25 NOTE — Progress Notes (Signed)
Follow up visit. Afebrile x 24 hours.   [name] ContesStephen O. Marti SleighHunter III, MD, PGY3 03/25/2014 4:26 PM

## 2014-03-30 ENCOUNTER — Encounter: Payer: Self-pay | Admitting: Pediatrics

## 2014-03-30 ENCOUNTER — Ambulatory Visit (INDEPENDENT_AMBULATORY_CARE_PROVIDER_SITE_OTHER): Payer: Medicaid Other | Admitting: Pediatrics

## 2014-03-30 DIAGNOSIS — G809 Cerebral palsy, unspecified: Secondary | ICD-10-CM

## 2014-03-30 NOTE — Progress Notes (Signed)
Patient: [name] SlickerJudith [name] MRN: [id number] Sex: female DOB: 12/26/1975  Provider: Deetta PerlaHICKLING,Raven Harmes H, MD [facility]  Note type: Routine return visit  History of Present Illness: Referral Source: Dr. Harvel QualeJacquelyn McGill History from: patient and hospital chart Chief Complaint: Interrogating Baclofen Pump After 03/17/14 Battery Replacement Surgery  [name] [name] is a 38 y.o. female who returns for evaluation and management of spasticity following replacement of her baclofen pump.  The procedure went without significant complication.  She has healed very nicely.  She was receiving Botox in her upper extremities and also her bladder.  She complained of urinary tract infection, but has a chronic indwelling Foley catheter.  Procedure: emptying, refilling, and reprogramming her intrathecal baclofen pump.   The device was interrogated and showed a complex continuous infusion of baclofen that has a basal rate of 13.5 mcg per hour. The patient receives bolus doses of 25 mcg delivered at midnight, 6 AM, 12 noon, and 6 PM, for total of 427.2 mcg per day.  The patient tolerated the procedure well.  Refill date is August 30, 2014.  Review of Systems: 12 system review was unremarkable  Past Medical History  Diagnosis Date  . Cerebral palsy   . Pulmonary embolism     Lifetime Coumadin  . Cerebral palsy     Spastic Cerebral palsy, mentally intact  . Contracture, joint, multiple sites     Electric wheelchair, uses left hand to operate chair.   [name] Kitchen. Hypertension   . OCD (obsessive compulsive disorder)   . Depression   . Migraine   . [name]   . Dysarthria   . Endometriosis   . History of recurrent UTIs   . GERD (gastroesophageal reflux disease)   . Pulmonary embolism 2011, 01/2011    Will be on lifetime coumadin  . Anemia   . Abdominal pain 01/02/2012    Overview:  Overview:  Per Previous pcp note- Dr. Ta--Pt has history of endometriosis  and had DUB in 2011.  She She had u/s that showed normal endometrial stripe.  She had endometrial bx that was wnl.  She has a lot of abd cramping every month.  This cramping was sometimes not relieved with hydrocodone.  [facility] for IUD placement to help endometriosis and abd cramping.  [name]  . Macroscopic hematuria 03/10/2013    status post replacement of suprapubic tube 03/04/2013   . HYPERTENSION, BENIGN 01/31/2010    Qualifier: Diagnosis of  By: Gala RomneyBensimhon, MD, Trixie DredgeFACC, Daniel R    Hospitalizations: yes, Head Injury: no, Nervous System Infections: no, Immunizations up to date: yes Past Medical History Comments: See surgical Hx for hospitalizations. Patient has a history of significant depression, anxiety, and nonepileptic seizures, chronic low back pain, asthma, insomnia, migraine headaches, and deep vein thrombosis with pulmonary embolism.  Birth History Extremely premature infant with periventricular leukomalacia.  Behavior History depression, anxiety  Surgical History Past Surgical History  Procedure Laterality Date  . Colposcopy  06/2000  . Cesarean section      x 2  . Tubal ligation  2003  . Carpal tunnel release  08/2008    Dr Teressa SenterSypher  . Wrist surgery  06/2010    Dr Dierdre SearlesLi[facility]  . Baclofen pump refill      x 3 times  . Appendectomy    . Intrauterine device insertion  04/30/11    [facility]  . Laparoscopically assisted vag hysterectomy  10/27/2012  . Multiple extractions with alveoloplasty Bilateral 07/19/2013    Procedure: EXTRACTIONS #4, 6,44,03,47;  Surgeon: Francene Finders, DDS;  Location: [facility] OR;  Service: Oral Surgery;  Laterality: Bilateral;  . Pain pump implantation N/A 03/07/2014    Procedure: baclofen pump revision/replacement and Catheter connection replacement;  Surgeon: Cristi Loron, MD;  Location: [facility];  Service: Neurosurgery;  Laterality: N/A;  baclofen pump revision/replacement and  Catheter connection replacement    Family History family history includes Alzheimer's disease in her paternal grandfather; Asthma in her father; Breast cancer in her paternal grandmother; Cancer in her paternal grandmother; Colon cancer in her maternal grandmother; Heart attack in her maternal grandfather. Family History is negative migraines, seizures, cognitive impairment, blindness, deafness, birth defects, chromosomal disorder, autism.  Social History History   Social History  . Marital Status: Divorced    Spouse Name: N/A    Number of Children: 2  . Years of Education: college   Occupational History  .    [name] Kitchen UNEMPLOYED    Social History Main Topics  . Smoking status: Never Smoker   . Smokeless tobacco: Never Used  . Alcohol Use: 0.6 oz/week    1 Glasses of wine per week     Comment: Drinks alcohol once a month.  . Drug Use: No  . Sexual Activity: No   Other Topics Concern  . None   Social History Narrative   Ailene Ards, who has schizophrenia, MR, is father of daughter.  Homero Fellers and pt are no longer together.  Homero Fellers does not see [name]. Daughter is 30 yo, Stage manager, lives with pt.        [name] [phone]), lives with pt's parents.      Needs assistance with ADL's and IADL's--has personal care services 20 hrs/wk;       No tobacco, EtOH, drugs.      **Patient very concerned about her kids being taken from her.      **Case Manager: Jack Quarto [phone]      Patient lives [employer] [facility] Living with other residents of [facility]  Hobbies/Interest: Enjoys reading School comments [name] graduated from [employer] in 2004 with a degree in Surveyor, minerals.   Current Outpatient Prescriptions on File Prior to Visit  Medication Sig Dispense Refill  . acetaminophen (TYLENOL) 325 MG tablet Take 2 tablets (650 mg total) by mouth every 8 (eight) hours.  90 tablet  0  . acetic acid (VOSOL) 2 % otic solution Place 4 drops into both  ears once a week.      . ARIPiprazole (ABILIFY) 5 MG tablet Take 5 mg by mouth every morning.       . bacitracin-polymyxin b (POLYSPORIN) ointment Apply topically 2 (two) times daily as needed (at cath site for redness, discharge).      . baclofen (LIORESAL) 10 MG tablet Take 2 tablets (20 mg total) by mouth 4 (four) times daily.  30 each  0  . clonazePAM (KLONOPIN) 1 MG tablet Take 1 tablet (1 mg total) by mouth 4 (four) times daily.  120 tablet  2  . diazepam (VALIUM) 5 MG tablet Take 1 tablet (5 mg total) by mouth every 6 (six) hours as needed for muscle spasms.  1 tablet  0  . docusate sodium (COLACE) 100 MG capsule Take 100 mg by mouth 2 (two) times daily.      [name] Kitchen ibuprofen (ADVIL,MOTRIN) 200 MG tablet Take 400 mg by mouth every  8 (eight) hours as needed for pain.      [name] Kitchen. lamoTRIgine (LAMICTAL) 200 MG tablet Take 200 mg by mouth daily.       [name] Kitchen. lidocaine (LIDODERM) 5 % Place 1 patch onto the skin daily. Remove & Discard patch within 12 hours or as directed by MD      . loratadine (CLARITIN) 10 MG tablet Take 10 mg by mouth daily as needed for allergies.      . Multiple Vitamin (MULTIVITAMIN WITH MINERALS) TABS Take 1 tablet by mouth every evening.       [name] Kitchen. oxyCODONE-acetaminophen (PERCOCET) 7.5-325 MG per tablet Take 1 tablet by mouth every 6 (six) hours as needed for pain.  20 tablet  0  . polyethylene glycol (MIRALAX / GLYCOLAX) packet Take 17 g by mouth 2 (two) times daily.  14 each  0  . QUEtiapine (SEROQUEL) 100 MG tablet Take 100 mg by mouth at bedtime.      . Rivaroxaban (XARELTO) 20 MG TABS Take 1 tablet (20 mg total) by mouth daily with supper.  30 tablet    . sertraline (ZOLOFT) 100 MG tablet Take 200 mg by mouth at bedtime.       . SUMAtriptan (IMITREX) 100 MG tablet Take 100 mg by mouth every 2 (two) hours as needed for migraine.       [name] Kitchen. tiZANidine (ZANAFLEX) 4 MG capsule Take 1 capsule (4 mg total) by mouth 3 (three) times daily.  30 capsule  0  . topiramate (TOPAMAX) 100 MG tablet  Take 100 mg by mouth at bedtime.        No current facility-administered medications on file prior to visit.   The medication list was reviewed and reconciled. All changes or newly prescribed medications were explained.  A complete medication list was provided to the patient/caregiver.  Allergies  Allergen Reactions  . 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  She is severely dysarthric and at times is difficult to understand her.  She has an indwelling Foley catheter.  She has significant kyphosis of the neck and is unable to keep her head upright.  Her baclofen pump operation site is well-healed.  Assessment 1.  Spastic and dystonic quadriparesis, from extreme prematurity, 343.2, 333.7.  Plan [name] had a successful emptying refilling and reprogramming her baclofen pump.  At present it appears that she has 80 months before it needs to be replaced.  We discussed a urinary tract infection that she had the she felt was related to Botox treatment of her bladder.  I suspect it has more to do with her suprapubic catheter with bacterial colonization  She has sought primary care treatment for this.  She will return in late September to empty,refill, and reprogram her intrathecal baclofen pump.  Deetta PerlaWilliam H Makaio Mach MD

## 2014-04-08 ENCOUNTER — Other Ambulatory Visit: Payer: Self-pay | Admitting: Family Medicine

## 2014-04-08 ENCOUNTER — Telehealth: Payer: Self-pay | Admitting: Family

## 2014-04-08 MED ORDER — HYDROCODONE-ACETAMINOPHEN 5-325 MG PO TABS: 1.0000 | ORAL_TABLET | Freq: Four times a day (QID) | ORAL | Status: DC | PRN

## 2014-04-08 NOTE — Telephone Encounter (Signed)
I spoke with Gavin [facility]eborah at [facility] to arrange transportation for [name] [name]'s appointment on Tues. May 12 at 9:45 am with her arriving at 9:20 am, I also called [name] to inform her of the appointment and also to remind her that she was seen on 03/30/14 to have her pump re programmed, she had forgotten that she was seen, however she will be seen to have her pump re checked and her hand evaluated. She agreed and confirmed understanding of our conversation. MB

## 2014-04-08 NOTE — Telephone Encounter (Signed)
[name] [name] called, crying, saying that she was having more spasms, and more difficulty using her left hand. She wanted appointment to be seen to have pump checked. I told her that Dr Sharene SkeansHickling was out of the office today but that we could likely see her next week. She asked for [name] [name] to let her know when appointment is scheduled as well as scheduling it with transportation service. [name] [name], would you see if you can schedule [name] [name] for appointment next week for Baclofen pump check and let her know when the appointment is scheduled? Thanks, Inetta Fermoina

## 2014-04-08 NOTE — Telephone Encounter (Signed)
Refilled med in response [facility]  vicodin 5/325 # 30

## 2014-04-12 ENCOUNTER — Ambulatory Visit (INDEPENDENT_AMBULATORY_CARE_PROVIDER_SITE_OTHER): Payer: Medicaid Other | Admitting: Pediatrics

## 2014-04-12 ENCOUNTER — Encounter: Payer: Medicaid Other | Admitting: Pediatrics

## 2014-04-12 ENCOUNTER — Encounter: Payer: Self-pay | Admitting: Pediatrics

## 2014-04-12 VITALS — BP 110/76 | HR 84

## 2014-04-12 DIAGNOSIS — G249 Dystonia, unspecified: Secondary | ICD-10-CM | POA: Insufficient documentation

## 2014-04-12 DIAGNOSIS — G809 Cerebral palsy, unspecified: Secondary | ICD-10-CM

## 2014-04-12 DIAGNOSIS — G808 Other cerebral palsy: Secondary | ICD-10-CM

## 2014-04-12 DIAGNOSIS — G803 Athetoid cerebral palsy: Secondary | ICD-10-CM

## 2014-04-12 NOTE — Progress Notes (Signed)
Patient: [name] [name] Willetts MRN: [id number] Sex: female DOB: Jun 07, 1976  Provider: Deetta PerlaHICKLING,WILLIAM H, MD [facility]  Note type: Urgent return visit  History of Present Illness: Referral Source: Dr. Harvel QualeJacquelyn McGill [facility] Chief Complaint: Baclofen Pump re-check, spasms and trouble using her left hand  [name] [name] Parlow is a 38 y.o. female who returns for evaluation and management of her intrathecal baclofen pump and progressive spasticity of her left hand.  [name] was seen Apr 12, 2014, for the first time since March 30, 2014, when I interrogated and reprogrammed her intrathecal baclofen pump.  She returns today with complaints of having spasms and difficulty using her left hand.  She came in today to reassess her pump.  Her last Botox injection was on January 05, 2014.  She is scheduled for repeat injections in early June approximately four months after her last injection.  It is not surprising that spasticity has returned.  In all likelihood this is because Botox is beginning to wear off.  [name] physically had increased flexion of her left hand since she was last seen.  I examined her carefully today, and believe that there is general increase in her tone.  For that reason made a modest increase in her baclofen of about 5%.    Procedure: emptying, refilling, and reprogramming her intrathecal baclofen pump.   The device was interrogated and showed a complex continuous infusion of baclofen that has a basal rate of 13.5 mcg per hour. The patient receives bolus doses of 25 mcg delivered at midnight, 6 AM, 12 noon, and 6 PM, for total of 427.2 mcg per day.   Her daily dose increased from 427.2 mcg to 449.7 mcg.  I increased both her basal rate to 14.8 mcg/hour and boluses of 27 mcg given four times a day beginning at midnight at six-hour intervals over 15 minutes.  Her reservoir alarm date has now moved up to August 23, 2014, or 133 days  from now.  Review of Systems: 12 system review was remarkable for spasms  Past Medical History  Diagnosis Date  . Cerebral palsy   . Pulmonary embolism     Lifetime Coumadin  . Cerebral palsy     Spastic Cerebral palsy, mentally intact  . Contracture, joint, multiple sites     Electric wheelchair, uses left hand to operate chair.   [name] Kitchen. Hypertension   . OCD (obsessive compulsive disorder)   . Depression   . Migraine   . [name]   . Dysarthria   . Endometriosis   . History of recurrent UTIs   . GERD (gastroesophageal reflux disease)   . Pulmonary embolism 2011, 01/2011    Will be on lifetime coumadin  . Anemia   . Abdominal pain 01/02/2012    Overview:  Overview:  Per Previous pcp note- Dr. Ta--Pt has history of endometriosis and had DUB in 2011.  She She had u/s that showed normal endometrial stripe.  She had endometrial bx that was wnl.  She has a lot of abd cramping every month.  This cramping was sometimes not relieved with hydrocodone.  [facility] IUD placement to help endometriosis and abd cramping.  [name]  . Macroscopic hematuria 03/10/2013    status post replacement of suprapubic tube 03/04/2013   . HYPERTENSION, BENIGN 01/31/2010    Qualifier: Diagnosis of  By: Gala RomneyBensimhon, MD, Trixie DredgeFACC, Daniel R    Hospitalizations: yes, Head Injury: no, Nervous  System Infections: no, Immunizations up to date: yes Past Medical History Comments: See surgical Hx for hospitalizations. Patient has a history of significant depression, anxiety, and nonepileptic seizures, chronic low back pain, asthma, insomnia, migraine headaches, and deep vein thrombosis with pulmonary embolism.  Birth History Extremely premature infant with periventricular leukomalacia.   Behavior History depression and anxiety  Surgical History Past Surgical History  Procedure Laterality Date  . Colposcopy  06/2000  . Cesarean section      x 2  . Tubal ligation  2003  . Carpal  tunnel release  08/2008    Dr Teressa Senter  . Wrist surgery  06/2010    Dr Dierdre Searles, hand surgeon, [name]  . Baclofen pump refill      x 3 times  . Appendectomy    . Intrauterine device insertion  04/30/11    Inserted by [facility] for endometriosis  . Laparoscopically assisted vag hysterectomy  10/27/2012  . Multiple extractions with alveoloplasty Bilateral 07/19/2013    Procedure: EXTRACTIONS #4, 1,61,09,60;  Surgeon: Francene Finders, DDS;  Location: [facility] OR;  Service: Oral Surgery;  Laterality: Bilateral;  . Pain pump implantation N/A 03/07/2014    Procedure: baclofen pump revision/replacement and Catheter connection replacement;  Surgeon: Cristi Loron, MD;  Location: [facility];  Service: Neurosurgery;  Laterality: N/A;  baclofen pump revision/replacement and Catheter connection replacement  . Programable baclofen pump revision  03/17/14    Battery Replacement     Family History family history includes Alzheimer's disease in her paternal grandfather; Asthma in her father; Breast cancer in her paternal grandmother; Cancer in her paternal grandmother; Colon cancer in her maternal grandmother; Heart attack in her maternal grandfather. Family History is negative for migraines, seizures, cognitive impairment, blindness, deafness, birth defects, chromosomal disorder, or autism.  Social History History   Social History  . Marital Status: Divorced    Spouse Name: N/A    Number of Children: 2  . Years of Education: college   Occupational History  .    [name] UNEMPLOYED    Social History Main Topics  . Smoking status: Never Smoker   . Smokeless tobacco: Never Used  . Alcohol Use: 0.6 oz/week    1 Glasses of wine per week     Comment: Drinks alcohol once a month.  . Drug Use: No  . Sexual Activity: No   Other Topics Concern  . None   Social History Narrative   Ailene Ards, who has schizophrenia, MR, is father of daughter.  Homero Fellers and pt are no longer together.  Homero Fellers does not see  [name]. Daughter is 51 yo, Stage manager, lives with pt.        [name] [phone]), lives with pt's parents.      Needs assistance with ADL's and IADL's--has personal care services 20 hrs/wk;       No tobacco, EtOH, drugs.      **Patient very concerned about her kids being taken from her.      **Case Manager: Jack Quarto [phone]      Patient lives [place]   Educational level junior college Living with other residents at [employer]: Enjoys reading  School comments [name] graduated from [employer] in 2004 with a degree in Surveyor, minerals.   Current Outpatient Prescriptions on File Prior to Visit  Medication Sig Dispense Refill  . acetaminophen (TYLENOL) 325 MG tablet Take 2 tablets (650 mg total) by mouth every 8 (eight) hours.  90 tablet  0  . acetic acid (VOSOL) 2 % otic solution Place 4 drops into both ears once a week.      . ARIPiprazole (ABILIFY) 5 MG tablet Take 5 mg by mouth every morning.       . bacitracin-polymyxin b (POLYSPORIN) ointment Apply topically 2 (two) times daily as needed (at cath site for redness, discharge).      . baclofen (LIORESAL) 10 MG tablet Take 2 tablets (20 mg total) by mouth 4 (four) times daily.  30 each  0  . clonazePAM (KLONOPIN) 1 MG tablet Take 1 tablet (1 mg total) by mouth 4 (four) times daily.  120 tablet  2  . diazepam (VALIUM) 5 MG tablet Take 1 tablet (5 mg total) by mouth every 6 (six) hours as needed for muscle spasms.  1 tablet  0  . docusate sodium (COLACE) 100 MG capsule Take 100 mg by mouth 2 (two) times daily.      [name] Kitchen. HYDROcodone-acetaminophen (NORCO/VICODIN) 5-325 MG per tablet Take 1 tablet by mouth every 6 (six) hours as needed for moderate pain.  30 tablet  0  . ibuprofen (ADVIL,MOTRIN) 200 MG tablet Take 400 mg by mouth every 8 (eight) hours as needed for pain.      [name] Kitchen. lamoTRIgine (LAMICTAL) 200 MG tablet Take 200 mg by mouth daily.       [name] Kitchen. lidocaine (LIDODERM) 5 % Place 1 patch onto the skin daily.  Remove & Discard patch within 12 hours or as directed by MD      . loratadine (CLARITIN) 10 MG tablet Take 10 mg by mouth daily as needed for allergies.      . Multiple Vitamin (MULTIVITAMIN WITH MINERALS) TABS Take 1 tablet by mouth every evening.       . polyethylene glycol (MIRALAX / GLYCOLAX) packet Take 17 g by mouth 2 (two) times daily.  14 each  0  . QUEtiapine (SEROQUEL) 100 MG tablet Take 100 mg by mouth at bedtime.      . Rivaroxaban (XARELTO) 20 MG TABS Take 1 tablet (20 mg total) by mouth daily with supper.  30 tablet    . sertraline (ZOLOFT) 100 MG tablet Take 200 mg by mouth at bedtime.       . SUMAtriptan (IMITREX) 100 MG tablet Take 100 mg by mouth every 2 (two) hours as needed for migraine.       [name] Kitchen. tiZANidine (ZANAFLEX) 4 MG capsule Take 1 capsule (4 mg total) by mouth 3 (three) times daily.  30 capsule  0  . topiramate (TOPAMAX) 100 MG tablet Take 100 mg by mouth at bedtime.        No current facility-administered medications on file prior to visit.   The medication list was reviewed and reconciled. All changes or newly prescribed medications were explained.  A complete medication list was provided to the patient/caregiver.  Allergies  Allergen Reactions  . Morphine Dermatitis    Skin turned red    Physical Exam BP 110/76  Pulse 84  LMP 04/05/2011  The patient has spasticity in her 4 limbs, Ashworth 2 in her legs, Ashworth 3 in her arms with clawhand deformities in both hands right greater than left. She is severely dysarthric and at times is difficult to understand her. She has an indwelling Foley catheter. She has significant kyphosis of the neck and is unable to keep her head upright. Her baclofen pump operation site is well-healed.  With repeated extension and flexion of her arms, her tone  diminished.  When I asked her to try to reach and extend her arms, with the left hand, and she began to extend her fingers as well.  She could not do this independently.  She was  able to extend both legs and flex them back she can't reach more than 1 foot away from her body.  Assessment 1. 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 2. Dystonia, 333.7.  Plan I told [name] that she needed to have range of motion and exercises carried out for 15 minutes three times daily.  When I performed this activity with her, she definitely loosened up.  She was able to nearly straighten her fingers on the left side when she attempted to reach with her left hand to touch my hand.  I think that she will benefit from Botox.  It should not be given any more often than every four months and therefore I will not attempt to move it up.  I do not know if increasing the baclofen will provide benefit or not.  Usually because of the placement of the catheter, arms do not respond nearly as much as legs to increased doses.  In addition to programming the patient I spent 15 minutes of face-to-face time with her more than half of it in consultation.  Deetta Perla MD

## 2014-04-13 DIAGNOSIS — G803 Athetoid cerebral palsy: Secondary | ICD-10-CM | POA: Insufficient documentation

## 2014-04-19 ENCOUNTER — Other Ambulatory Visit: Payer: Self-pay | Admitting: Family Medicine

## 2014-04-19 MED ORDER — CLONAZEPAM 1 MG PO TABS: 1.0000 mg | ORAL_TABLET | Freq: Four times a day (QID) | ORAL | Status: DC

## 2014-04-22 ENCOUNTER — Encounter: Payer: Self-pay | Admitting: Pharmacist

## 2014-05-04 ENCOUNTER — Ambulatory Visit (INDEPENDENT_AMBULATORY_CARE_PROVIDER_SITE_OTHER): Payer: Medicaid Other | Admitting: Neurology

## 2014-05-04 ENCOUNTER — Other Ambulatory Visit: Payer: Self-pay | Admitting: *Deleted

## 2014-05-04 ENCOUNTER — Encounter: Payer: Self-pay | Admitting: Neurology

## 2014-05-04 ENCOUNTER — Encounter (INDEPENDENT_AMBULATORY_CARE_PROVIDER_SITE_OTHER): Payer: Self-pay

## 2014-05-04 DIAGNOSIS — G808 Other cerebral palsy: Secondary | ICD-10-CM

## 2014-05-04 DIAGNOSIS — G894 Chronic pain syndrome: Secondary | ICD-10-CM

## 2014-05-04 DIAGNOSIS — G809 Cerebral palsy, unspecified: Secondary | ICD-10-CM

## 2014-05-04 MED ORDER — ONABOTULINUMTOXINA 100 UNITS IJ SOLR
300.0000 [IU] | Freq: Once | INTRAMUSCULAR | Status: AC
  Administered 2014-05-04: 300 [IU] via INTRAMUSCULAR

## 2014-05-04 NOTE — Progress Notes (Addendum)
HPI:   [name] [name] is a 38 year old Female with spastic and dystonic quadriparesis, depression/ anxiety, episodes where she loses contact with her world and that have been studied thoroughly and appear nonepileptic in nature, migraine headaches.  She is currently taking coumadin for her previous DVT, recurrent pulmonary emboli.  She came in for EMG guided BOTOX A injection, previously was done by Dr. Kelli Hope since Feb, 2006, received injection at [facility] prior to that.   BOTOX has always been to her right arm, where she often complains of spastic pain, her left arm even though has significant spasticity, is her functional arm.  She used her left arm to manipulate wheelchair, operating cellphone and feed herself.  She also has intrathecal baclofen pump. She used to be able to transfer herself in/out of wheelchair, living in NH now she has gradually worsening functional capacity over the past few month, especially since her recent hospital admission in August 2012. She complained of syncopy, there was associated hypoxia to 83%, CT angiogram of bran and neck were normal. EEG ws normal. abilify and klonopin dosage were decreased  She was discharged to nursing home now, she could no longer transfer herself in and out of wheelchair, she has less functional left arm.  UPDATE June 3rd 2015: Last injection was in Feb 2015, she received injection to her right arm, and left hand which has helped her a lot, there was no significant side effect noticed.  Today she complains of worsening left arm function, difficulty straight out fingers, wants more injection to her left hand, which is her functional hand   She was admitted to hospital in April the fifteenth for abdominal pain, likely related to constipations, CT showed large stool burden, she was treated with Colace, MiraLax, and enema, symptoms has resolved, she is to continue taking Xalreto for her pulmonary emboli  Physical Examinations:  Mental  Status: seated in wheelchair, slumped slightly, with head band and chest vest to hold her body posture.  Cranial Nerves: round minimally reactive pupils,  conjugate eye movement, dystonic but symmetric facial strength, difficulty protruding her tongue, severe dysarthria to the point of being in unintelligible at times Motor: right arm in shoulder external rotation, pronation, elbow flexion, wrist and finger flexion, when extend, she has uncontrollable right shoulder posterior extension.  Left arm able to have antigravity movement, but slow movement under voluntary control, stayed in left elbow pronation, flexion. The left hand was fisted. She was able to move her legs against gravity.  Sensory: Withdrawal to pain Coordination:  poor coordination Gait and Station: spastic diplegia, in wheelchair   Assessment and Plan:  38 yo Caucasian female with spastic quadriplegia, under EMG guidance,  300 units of BOTOX A were injected,     Right Rhomboid 25 units   Right teres major 25   Right infraspinatus 25  Right posterior deltoid 25  Left flexor digitorum profundus 25 units  Left flexor carpi ulnaris 25  Let flexor digitorum superficialis 25 Left hand lumbrical divided into 4 injection 25 units  Left biceps 25x2=50 Left brachialis 25x2=50 units  She tolerates the injection well, will return in 3 months for repeat injections.

## 2014-05-06 ENCOUNTER — Telehealth: Payer: Self-pay | Admitting: Neurology

## 2014-05-06 NOTE — Telephone Encounter (Signed)
Pt states her Botox inj did not work this time. Wants Annabelle Harman to call her.

## 2014-05-09 ENCOUNTER — Telehealth: Payer: Self-pay

## 2014-05-09 NOTE — Telephone Encounter (Signed)
Patient states her left hand is in a fist since she got the Botox injection and her right arm is really stiff. [phone] please call.  Darreld Mclean was out of the office Friday 05-06-2014.

## 2014-05-09 NOTE — Telephone Encounter (Signed)
Called and left patient a message to give injection two more weeks.  And see what the response is  And that Dr.Yan may change injection pattern around next time. If patient has any questions please call the office back.

## 2014-05-09 NOTE — Telephone Encounter (Signed)
Patient has significant dysarthria, [name] [name], please let her wait 2 more weeks to see her response to BOTOX injection, let her know, we may change injection pattern next time.

## 2014-05-09 NOTE — Telephone Encounter (Signed)
Patient called back and stated that she can't wait two more weeks she cant hold her cell phone or use her hand to use operate her power wheel chair. Patient wants Dr.Yan to call her tomorrow. 05-10-2014.

## 2014-05-10 ENCOUNTER — Telehealth: Payer: Self-pay | Admitting: Neurology

## 2014-05-10 NOTE — Telephone Encounter (Signed)
Dr. Terrace Arabia please call the patient back after 4pm today.  Don't leave a message but if she doesn't pick up please call right back so she can get to phone.  She said she can't wait for 2 weeks, having trouble with her hand.

## 2014-05-10 NOTE — Telephone Encounter (Signed)
Patient wanting a call back from Lupita Leash the nurse regarding her Botox. Please call to advise.

## 2014-05-10 NOTE — Telephone Encounter (Signed)
Chart reviewed, BOTOX injection in June 3rd, will wait till the peak of the benefit (side effect too) around June 27th,   Annabelle Harman, let her know, if she has significant trouble then, I will see her then.

## 2014-05-11 NOTE — Telephone Encounter (Signed)
Patient called back to the office patient is on Doctor Terrace Arabia schedule.

## 2014-05-12 ENCOUNTER — Encounter: Payer: Self-pay | Admitting: Pharmacist

## 2014-05-12 ENCOUNTER — Encounter: Payer: Self-pay | Admitting: Neurology

## 2014-05-12 ENCOUNTER — Ambulatory Visit (INDEPENDENT_AMBULATORY_CARE_PROVIDER_SITE_OTHER): Payer: Medicaid Other | Admitting: Neurology

## 2014-05-12 DIAGNOSIS — G809 Cerebral palsy, unspecified: Secondary | ICD-10-CM

## 2014-05-12 MED ORDER — TIZANIDINE HCL 4 MG PO TABS: 4.0000 mg | ORAL_TABLET | Freq: Four times a day (QID) | ORAL | Status: DC

## 2014-05-12 NOTE — Progress Notes (Signed)
HPI:   [name] [name] is a 38 year old Female with spastic and dystonic quadriparesis, depression/ anxiety, episodes where she loses contact with her world and that have been studied thoroughly and appear nonepileptic in nature, migraine headaches.  She is currently taking coumadin for her previous DVT, recurrent pulmonary emboli.  She came in for EMG guided BOTOX A injection, previously was done by Dr. Kelli Hope since Feb, 2006, received injection at [facility] prior to that.   BOTOX has always been to her right arm, where she often complains of spastic pain, her left arm even though has significant spasticity, is her functional arm.  She used her left arm to manipulate wheelchair, operating cellphone and feed herself.  She also has intrathecal baclofen pump. She used to be able to transfer herself in/out of wheelchair, living in NH now she has gradually worsening functional capacity over the past few month, especially since her recent hospital admission in August 2012. She complained of syncopy, there was associated hypoxia to 83%, CT angiogram of bran and neck were normal. EEG ws normal. abilify and klonopin dosage were decreased  She was discharged to nursing home now, she could no longer transfer herself in and out of wheelchair, she has less functional left arm.  UPDATE June 3rd 2015: Last injection was in Feb 2015, she received injection to her right arm, and left hand which has helped her a lot, there was no significant side effect noticed.  Today she complains of worsening left arm function, difficulty straight out fingers, wants more injection to her left hand, which is her functional hand   She was admitted to hospital in April the fifteenth for abdominal pain, likely related to constipations, CT showed large stool burden, she was treated with Colace, MiraLax, and enema, symptoms has resolved, she is to continue taking Kerin Salen for her pulmonary emboli  UPDATE June 11th 2015: This is an  urgent visit for patient, since last Botox injection in June third 2015, she complains of difficulty straight out her left hand, increased difficulty using her left hand, which is her functional hand, operating wheelchair,  I injected total 200 units of Botox under electronic stimulation, to her left arm, left forearm, left hand, with emphasized on correcting her left elbow flexion, finger flexion, wrist flexion   Physical Examinations:  Mental Status: seated in wheelchair, slumped slightly, with head band and chest vest to hold her body posture.  Cranial Nerves: round minimally reactive pupils,  conjugate eye movement, dystonic but symmetric facial strength, difficulty protruding her tongue, severe dysarthria to the point of being in unintelligible at times Motor: right arm in shoulder external rotation, pronation, elbow extended, wrist and finger flexion, maximum 90 with right wrist extension. she has uncontrollable right shoulder posterior extension.  Left arm able to have antigravity movement, but slow movement under voluntary control, stayed in left elbow pronation, flexion. The left hand was fisted. Very slow release of her left finger flexion, with passive movement, she has full range of motion of left shoulder, elbow, and left fingers. She was able to move her legs against gravity 2/5.  Sensory: Withdrawal to pain Coordination:  poor coordination Gait and Station: spastic diplegia, in wheelchair   Assessment and Plan:  38 yo Caucasian female with spastic quadriplegia, had EMG guided Botox injection for bilateral upper extremity spasticity, continue to complain left hands forceful finger flexion, thumb in position, this is one week after the injection, may take longer for the BOTOX to take effect.  Increase the tizanidine  4 mg 4 times a day, Continue Botox injection every 3 months,

## 2014-05-17 ENCOUNTER — Other Ambulatory Visit: Payer: Self-pay | Admitting: Family Medicine

## 2014-05-17 MED ORDER — HYDROCODONE-ACETAMINOPHEN 5-325 MG PO TABS: 1.0000 | ORAL_TABLET | Freq: Four times a day (QID) | ORAL | Status: DC | PRN

## 2014-05-24 ENCOUNTER — Non-Acute Institutional Stay (INDEPENDENT_AMBULATORY_CARE_PROVIDER_SITE_OTHER): Payer: Medicaid Other | Admitting: Family Medicine

## 2014-05-24 DIAGNOSIS — Z9350 Unspecified cystostomy status: Secondary | ICD-10-CM

## 2014-05-24 DIAGNOSIS — G801 Spastic diplegic cerebral palsy: Secondary | ICD-10-CM

## 2014-05-24 DIAGNOSIS — Z9359 Other cystostomy status: Secondary | ICD-10-CM

## 2014-05-24 DIAGNOSIS — G809 Cerebral palsy, unspecified: Secondary | ICD-10-CM

## 2014-05-24 DIAGNOSIS — F411 Generalized anxiety disorder: Secondary | ICD-10-CM

## 2014-05-24 NOTE — Progress Notes (Signed)
Patient ID: [name] SlickerJudith [name]    DOB: Sep 19, 1976, 38 y.o.   MRN: [id number] --- Subjective:  [name] ClosJudith is a 38 y.o.female with h/o spastic CP, nursing home patient who is seen in the nursing home.   Patient's main concern is regarding her indwelling catheter. She reports having felt the foley bulb in her vagina on 3-4 occasions in the last month. This occurs when the foley is being adjusted.  She denies any worsening abdominal pain during these episodes. Denies any dysuria, vaginal discharge.   - spasticity: continues to have increased spasticity in the left hand. Has been having more difficulty operating her cell phone. She received botox injections in early June which she doesn't think helped as much. She was seen in follow up by Dr. Terrace ArabiaYan who increased her tizanidine to 4mg  qid.   - recent stressors: she expresses some distress regarding her teenage daughter who lives in a foster home and who has been having some behavioral and psychological problems. This has caused a lot of worry to [name] [name]. The family is working on trying to find a more appropriate home for the patient's daughter.    ROS: see HPI Past Medical History: reviewed and updated medications and allergies. Social History: Tobacco: none  Objective: There were no vitals filed for this visit.  Physical Examination:   General appearance - alert, no acute distress, spastic contractures bilaterally, sitting in wheelchair Chest - clear to auscultation, no wheezes, rales or rhonchi, symmetric air entry Heart - normal rate, regular rhythm, normal S1, S2, no murmurs Abdomen - soft, nontender, nondistended, no masses or organomegaly, suprapubic catheter in place without surrounding erythema, warmth or purulent discharge.  Extremities - left hand curled into fist, examiner able to open patient's hand with some gentle force.

## 2014-05-25 ENCOUNTER — Non-Acute Institutional Stay: Payer: Medicaid Other | Admitting: Family Medicine

## 2014-05-25 DIAGNOSIS — N390 Urinary tract infection, site not specified: Secondary | ICD-10-CM

## 2014-05-25 DIAGNOSIS — Z9359 Other cystostomy status: Secondary | ICD-10-CM

## 2014-05-25 DIAGNOSIS — Z9350 Unspecified cystostomy status: Secondary | ICD-10-CM

## 2014-05-25 MED ORDER — B COMPLEX-C PO TABS: 1.0000 | ORAL_TABLET | Freq: Every day | ORAL | Status: DC

## 2014-05-25 NOTE — Progress Notes (Signed)
  Geriatric Attending Progress Note  Subjective:    Patient ID: [name] SlickerJudith [name], female    DOB: [birth year], 38 y.o.   MRN: [id number]  HPI Patient seen for routine f/u visit:  1. Acute events: none  2. Suprapubic catheter: draining well. She denies suprapubic pain. No hematuria.   3. MVI: patient request a multivitamin. Reports feeling sluggish   Soc hx: non smoker  Review of Systems As per HPI     Objective:   Physical Exam BP 104/62  Pulse 86  Temp(Src) 97.7 F (36.5 C)  Resp 18  Wt 123 lb 11.2 oz (56.11 kg)  LMP 04/05/2011 General appearance: alert, cooperative and no distress Lungs: clear to auscultation bilaterally Heart: regular rate and rhythm, S1, S2 normal, no murmur, click, rub or gallop Abdomen: soft, non-tender; bowel sounds normal; no masses,  no organomegaly, suprapubic catheter draining yellow urine.     Assessment & Plan:

## 2014-05-25 NOTE — Assessment & Plan Note (Signed)
No symptoms. Will resolve problem

## 2014-05-25 NOTE — Assessment & Plan Note (Signed)
A: working well. No complaints. P: routine urology f/u

## 2014-05-27 NOTE — Assessment & Plan Note (Signed)
Continue current anti-spasticity agents including tizanidine and baclofen.  botox injections for upper extremities to be done in 3 months.

## 2014-05-27 NOTE — Assessment & Plan Note (Signed)
Home stressors related to her daughter. Patient does have a good support system with her companion who sees her every day. She does state that this is helpful to her. Continue current medications including sertraline, diazepam and bupropion.

## 2014-05-27 NOTE — Assessment & Plan Note (Signed)
Appointment to be made with patient's urologist for evaluation of reported dysfunction with foley catheter.  No evidence of UTI. No abdominal pain or nausea or vomiting or fevers which is all reassuring.

## 2014-06-01 ENCOUNTER — Non-Acute Institutional Stay: Payer: Medicaid Other | Admitting: Family Medicine

## 2014-06-01 DIAGNOSIS — R109 Unspecified abdominal pain: Secondary | ICD-10-CM

## 2014-06-01 DIAGNOSIS — N319 Neuromuscular dysfunction of bladder, unspecified: Secondary | ICD-10-CM

## 2014-06-02 NOTE — Addendum Note (Signed)
Addended by: Acquanetta BellingMCDIARMID, Krisann Mckenna D on: 06/02/2014 04:37 PM   Modules accepted: Level of Service

## 2014-06-02 NOTE — Assessment & Plan Note (Addendum)
Given timeline, suprapubic tenderness, and no signs of infection on exam in afebrile patient, most likely bladder spasm after changing of suprapubic catheter yesterday 6/30. - Add vesicare 5mg  daily x 3 days. - Add extra dose of vicodin 5-325mg  x 1 now. - Discussed with patient and nurse. - If no improvement or worsening, nursing to let team know.

## 2014-06-02 NOTE — Progress Notes (Signed)
Patient ID: Mindi SlickerJudith [name], female   DOB: [birth year], 38 y.o.   MRN: [id number] I have seen and examined this patient with Dr Benjamin Stainhekkekandam.   I agree with their findings and plans as documented in their nursing home visit note.

## 2014-06-02 NOTE — Progress Notes (Signed)
Patient ID: Mindi SlickerJudith Andre, female   DOB: 02-Jun-1976, 38 y.o.   MRN: [id number] Subjective:   CC: Abdominal pain  HPI:   Mindi SlickerJudith Antrim is a 38 y.o. female with h/o CP and neurogenic bladder s/p suprapubic catheterization. She reports severe lower abdominal pain that started after catheter change by urology yesterday  (6/30). Pain is constant. She got narcotic pain medicine this morning at 9AM (2 hours ago) and it helped "some" but pain is back. She has eaten today and has not had BM in 2 days but constipation pain has not felt this constant to her before. She has been breathing normally.  Review of Systems - Per HPI.   PMH: Urology came to see her yesterday ([facility]) and changed out suprapubic catheter.     Objective:  Physical Exam LMP 04/05/2011 VS: 98.60F, 82 BPM, 18 RR, 115/69, 97% GEN: NAD, pleasant, uncomfortable-appearing; seated in wheelchair ABD: Soft, tender to palpation suprapubically, catheter site with mucoid yellowish drainage and mild erythema but no induration or purulence, urine in catheter is clear EXTR: Contractured NEURO: Awake, alert, speech is difficult to understand but fluent and goal-oriented and pt interacts appropriately    Assessment:     Mindi SlickerJudith Kita is a 38 y.o. female with h/o CP and neurogenic bladder with suprapubic catheter here for abdominal pain.    Plan:     Abdominal pain Given timeline, suprapubic tenderness, and no signs of infection on exam in afebrile patient, most likely bladder spasm after changing of suprapubic catheter yesterday 6/30. - Add vesicare 5mg  daily x 3 days. - Add extra dose of vicodin 5-325mg  x 1 now. - Discussed with patient and nurse. - If no improvement or worsening, nursing to let team know.  Neurogenic bladder with suprapubic catheter Seen by urology yesterday, suprapubic catheter changed out.  - Per urology, should be changed monthly [facility]). - Continue to irrigate  catheter with 50-100cc NS daily and PRN.   Leona SingletonMaria T Shahmeer Bunn, MD [facility]

## 2014-06-02 NOTE — Assessment & Plan Note (Signed)
Seen by urology yesterday, suprapubic catheter changed out.  - Per urology, should be changed monthly at their [place] office [facility]). - Continue to irrigate catheter with 50-100cc NS daily and PRN.

## 2014-06-17 ENCOUNTER — Telehealth: Payer: Self-pay | Admitting: Neurology

## 2014-06-17 NOTE — Telephone Encounter (Signed)
Sophire from [facility] calling to check on the status of letter that patient sent regarding switching providers. Please return call and advise.

## 2014-06-20 ENCOUNTER — Non-Acute Institutional Stay: Payer: Medicaid Other | Admitting: Family Medicine

## 2014-06-20 DIAGNOSIS — Z9350 Unspecified cystostomy status: Secondary | ICD-10-CM

## 2014-06-20 DIAGNOSIS — G801 Spastic diplegic cerebral palsy: Secondary | ICD-10-CM

## 2014-06-20 DIAGNOSIS — G809 Cerebral palsy, unspecified: Secondary | ICD-10-CM

## 2014-06-20 DIAGNOSIS — Z9359 Other cystostomy status: Secondary | ICD-10-CM

## 2014-06-20 DIAGNOSIS — I10 Essential (primary) hypertension: Secondary | ICD-10-CM

## 2014-06-20 DIAGNOSIS — F339 Major depressive disorder, recurrent, unspecified: Secondary | ICD-10-CM

## 2014-06-20 NOTE — Telephone Encounter (Signed)
Called and left VM for return call back with more detailed information

## 2014-06-20 NOTE — Progress Notes (Signed)
Patient ID: [name] SlickerJudith [name], female   DOB: 27-Feb-1976, 38 y.o.   MRN: [id number]   [name] [name] FerrettiMelanie C Shamir Sedlar, MD Phone: [phone]  Subjective:  [name] [name] is a 38 y.o F with hx of CP and neurogenic bladder who currently resides in the nursing home   Patient's main concern today is rhinorrhea. Started about 2 weeks ago, does have hx of allergies in the past but is unclear of triggers. She is worried that her runny nose will drip into her food bc of the way her head hangs  -denies sore throat, fevers or chills  -denies SOB, chest pain  Neurogenic bladder- she was seen by urology ([name] [name]) on 6/30 for change of suprapubic catheter at which time she experienced significant discomfort -pt was started on vesicare 5mg  daily X3 days and given extra dose of vicodin at that time  -since then has been feeling ok; not discomfort, plans to have cath changed in about 1 week   Spasticity- she has seen by Dr. Terrace ArabiaYan who recently increased her tizanidine to 4mg  QID at that time -did have botox injections in June which were questionably helpful at that time  Depression- saw [name], told them that she wanted to stop the wellbutrin because she wanted to be able to cry -otherwise feels in good spirits -went to the mall on Monday and did some shopping  All relevant systems were reviewed and were negative unless otherwise noted in the HPI  Past Medical History Patient Active Problem List   Diagnosis Date Noted  . Athetoid cerebral palsy 04/13/2014  . Dystonia 04/12/2014  . Constipation 03/16/2014  . Presence of intrathecal baclofen pump 03/07/2014  . Spasticity 01/05/2014  . Nursing home resident 09/16/2013  . Dental caries 07/18/2013  . Multiple thyroid nodules 05/20/2013  . Osteoarthrosis 04/07/2013  . Anemia 04/07/2013  . [name]   . Dysarthria   . GERD (gastroesophageal reflux disease)   . Gross hematuria 03/12/2013  . Neurogenic bladder  02/05/2013  . Anxiety state 10/21/2012  . Healed or old pulmonary embolism 10/21/2012  . History of anticoagulant therapy 10/21/2012  . Chronic pain syndrome 06/17/2012  . Abdominal pain 01/02/2012  . Intracervical pessary 05/07/2011  . Recurrent pulmonary embolism 05/03/2011  . Chronic suprapubic catheter 03/15/2011  . Dyspnea and respiratory abnormality 02/04/2011  . Seborrheic keratosis 01/18/2011  . Long term (current) use of anticoagulants 01/12/2011  . Disease of female genital organs 10/24/2010  . WRIST PAIN, LEFT 03/20/2010  . HYPERTENSION, BENIGN 01/31/2010  . CP (cerebral palsy), spastic 12/27/2008  . Contracted, joint, multiple sites 12/27/2008  . DEPRESSION, MAJOR, RECURRENT 01/29/2007  . OBSESSIVE COMPUL. DISORDER 01/29/2007  . Infantile cerebral palsy 01/29/2007  . Anancastic neurosis 01/29/2007   Reviewed problem list.  Medications- reviewed and updated Chief complaint-noted No additions to family history Social history- patient is a never smoker  Objective: LMP 04/05/2011 Vitals: 147/63, 74, 20, 98.3  Gen: NAD, alert, cooperative with exam, sitting in wheelchair HEENT: poor dentition; throat non erythematous, no exudates; maxillary sinuses TTP CV: RRR, good S1/S2, no murmur, cap refill <3 Resp: CTABL, no wheezes, non-labored Abd: SNTND, BS present,Suprapubic cath in place no induration or purulence, urine in catheter is clear Ext: contractures noted  Neuro: Alert and oriented, No gross deficits, speech somewhat difficult to understand but is goal oriented and appropriate   Assessment/Plan: See problem based a/p

## 2014-06-21 ENCOUNTER — Telehealth: Payer: Self-pay | Admitting: Family Medicine

## 2014-06-21 ENCOUNTER — Other Ambulatory Visit: Payer: Self-pay | Admitting: Family Medicine

## 2014-06-21 MED ORDER — CLONAZEPAM 1 MG PO TABS: 1.0000 mg | ORAL_TABLET | Freq: Four times a day (QID) | ORAL | Status: DC

## 2014-06-21 NOTE — Telephone Encounter (Signed)
Received faxed letter and gave to Ms Cathey EndowBowen

## 2014-06-21 NOTE — Telephone Encounter (Signed)
[name] [name] with [facility] @ [phone] returning [name]'s call.

## 2014-06-21 NOTE — Telephone Encounter (Signed)
Called by RN for paper rx for clonazepam. Dr McDiarmid faxed rx today, but nursing states they need a written rx. I have written the rx for 120 tabs with 0 refills (same as Dr McDiarmid's rx today) and given to RN Rinatta.  Leona SingletonMaria T Paris Hohn, MD

## 2014-06-21 NOTE — Telephone Encounter (Signed)
Spoke with [name] [name] and she is inquiring about a letter that was mailed stating that patient is requesting to change providers. Asked that she fax the letter if possible ,since we have not received anything yet, she said that she would

## 2014-06-22 NOTE — Assessment & Plan Note (Addendum)
[facility] visited [facility] today Requested d/c of her wellbutrin Still will cont with abilify and zoloft Appears mood stable at this point in time

## 2014-06-22 NOTE — Assessment & Plan Note (Signed)
Replaced 05/2014; Currently asymptomatic  pt will f/[facility] in [facility] weeks time with [facility]  No abd pain currently or n/v

## 2014-06-22 NOTE — Assessment & Plan Note (Signed)
Currently 140s/60s Will monitor closely  Potentially need to increase BP monitoring to more frequently than qweekly

## 2014-06-22 NOTE — Assessment & Plan Note (Signed)
Pt to cont on anti-spasticity agents inc baclofen and tizanidine Feels currently at her baseline  Has upcoming botox injections targeted to upper extremities

## 2014-06-24 ENCOUNTER — Encounter: Payer: Self-pay | Admitting: Family Medicine

## 2014-06-24 NOTE — Progress Notes (Signed)
Patient ID: [name] SlickerJudith [name], female   DOB: Mar 01, 1976, 38 y.o.   MRN: [id number] I have seen and examined this patient. I have discussed with Dr Michail JewelsMarsh.  I agree with their findings and plans as documented in their regulatory visit note.  Bupropion recently stopped by Dr Michail JewelsMarsh.  Monitor relapse in MDD symptoms / signs.

## 2014-06-29 ENCOUNTER — Other Ambulatory Visit: Payer: Self-pay | Admitting: Family Medicine

## 2014-06-29 MED ORDER — CETIRIZINE HCL 5 MG PO TABS: 5.0000 mg | ORAL_TABLET | Freq: Every day | ORAL | Status: DC

## 2014-07-01 ENCOUNTER — Telehealth: Payer: Self-pay | Admitting: *Deleted

## 2014-07-01 NOTE — Telephone Encounter (Signed)
I have Checked Dr.Yan's office and I  Don't have any faxes from [place] .

## 2014-07-15 ENCOUNTER — Other Ambulatory Visit: Payer: Self-pay | Admitting: Family Medicine

## 2014-07-15 MED ORDER — SOLIFENACIN SUCCINATE 5 MG PO TABS: 5.0000 mg | ORAL_TABLET | Freq: Every day | ORAL | Status: AC

## 2014-07-19 ENCOUNTER — Encounter: Payer: Self-pay | Admitting: Pharmacist

## 2014-07-26 ENCOUNTER — Telehealth: Payer: Self-pay | Admitting: Family Medicine

## 2014-07-26 ENCOUNTER — Other Ambulatory Visit: Payer: Self-pay | Admitting: Family Medicine

## 2014-07-26 MED ORDER — CLONAZEPAM 1 MG PO TABS: 1.0000 mg | ORAL_TABLET | Freq: Four times a day (QID) | ORAL | Status: DC

## 2014-07-26 NOTE — Telephone Encounter (Signed)
Needs refill on clonapin---DESPERATELY

## 2014-07-26 NOTE — Telephone Encounter (Signed)
Refilled per Dr. Levonne Lapping [facility], [place]

## 2014-07-26 NOTE — Telephone Encounter (Signed)
Called by [facility] requesting Klonopin again. Will re=print and address there. If already on the file from this am will shred.   Murtis Sink, MD [facility] Resident, PGY-3 07/26/2014, 3:59 PM

## 2014-07-28 ENCOUNTER — Other Ambulatory Visit: Payer: Self-pay | Admitting: Family Medicine

## 2014-07-28 NOTE — Telephone Encounter (Signed)
DC'd valium. Per [facility] she hasnt had this since April 2015.   Murtis Sink, MD [facility] Resident, PGY-3 07/28/2014, 9:36 AM

## 2014-08-01 ENCOUNTER — Other Ambulatory Visit: Payer: Self-pay | Admitting: Family Medicine

## 2014-08-01 MED ORDER — CLONAZEPAM 1 MG PO TABS: 1.0000 mg | ORAL_TABLET | Freq: Four times a day (QID) | ORAL | Status: DC

## 2014-08-02 ENCOUNTER — Telehealth: Payer: Self-pay | Admitting: *Deleted

## 2014-08-02 NOTE — Telephone Encounter (Signed)
[name] [name] called in to report that her cerebral palsy is getting worse that she can't hardly use her hands and she can't keep her head up at all, I asked her what happened to the band that connected to her chair and forehead to keep her head upright and she stated that it no longer works for her. [name] [name] would like to speak with Dr. Sharene Skeans, she can be reached at [phone]. MB

## 2014-08-02 NOTE — Telephone Encounter (Signed)
I spoke with the patient, and she needs an appointment.  Please talk to me about this tomorrow.

## 2014-08-09 ENCOUNTER — Other Ambulatory Visit: Payer: Self-pay | Admitting: Family Medicine

## 2014-08-09 DIAGNOSIS — M1990 Unspecified osteoarthritis, unspecified site: Secondary | ICD-10-CM

## 2014-08-09 MED ORDER — HYDROCODONE-ACETAMINOPHEN 5-325 MG PO TABS: 1.0000 | ORAL_TABLET | Freq: Four times a day (QID) | ORAL | Status: DC | PRN

## 2014-08-09 NOTE — Telephone Encounter (Signed)
[name] called today and I gave her the appointment that was scheduled for Sept 21st. I don't know if her transportation knows about it. I will talk to Marcelino Duster about who to call about that. TG

## 2014-08-10 ENCOUNTER — Telehealth: Payer: Self-pay | Admitting: *Deleted

## 2014-08-10 NOTE — Telephone Encounter (Signed)
Noted, thank you

## 2014-08-10 NOTE — Telephone Encounter (Signed)
Tina Dr. Sharene Skeans wants to see [name] [name] before her baclofen appointment and I spoke with him about where to schedule her and he has not gotten back with me yet so I'm going to take a look at the schedule and get her in, the facility is already aware of the 9/21 appointment and I'll take care of getting her scheduled for a physical appointment with Dr. Rexene Edison. Thanks so much for your help with this. MB

## 2014-08-10 NOTE — Telephone Encounter (Signed)
[name] [name] please call [name] back

## 2014-08-10 NOTE — Telephone Encounter (Signed)
Called and spoke to patient she is wanting a sooner appt. For her Botox . Patient can't get Botox any sooner because she is not at her three month mark . Patient wants Dr.Yan she just wants to concentrate on her hand keeping it open.Patient has appt 08-24-2014. At 3:30.

## 2014-08-10 NOTE — Telephone Encounter (Signed)
[name] [name] has been scheduled to come in to see Dr. Sharene Skeans on 9/15 at 9:45 am with her arriving at 9:20 am. I have spoken with transportation scheduler at [facility] and they are aware that she is still coming on the 21st of this month as well. MB

## 2014-08-15 ENCOUNTER — Telehealth: Payer: Self-pay | Admitting: Family Medicine

## 2014-08-15 NOTE — Telephone Encounter (Signed)
Nursing staff calling reporting the patient states that she is "lightheaded". Stable vital signs per nursing staff. No current distress. Patient is at her current baseline.   Advised RN to follow closely. Will have nursing home resident/attending physician address in a.m.

## 2014-08-16 ENCOUNTER — Encounter: Payer: Self-pay | Admitting: Pediatrics

## 2014-08-16 ENCOUNTER — Ambulatory Visit (INDEPENDENT_AMBULATORY_CARE_PROVIDER_SITE_OTHER): Payer: Medicaid Other | Admitting: Pediatrics

## 2014-08-16 VITALS — BP 98/68 | HR 96 | Wt 132.0 lb

## 2014-08-16 DIAGNOSIS — G803 Athetoid cerebral palsy: Secondary | ICD-10-CM

## 2014-08-16 DIAGNOSIS — Z978 Presence of other specified devices: Secondary | ICD-10-CM

## 2014-08-16 DIAGNOSIS — Z9889 Other specified postprocedural states: Secondary | ICD-10-CM

## 2014-08-16 DIAGNOSIS — F339 Major depressive disorder, recurrent, unspecified: Secondary | ICD-10-CM

## 2014-08-16 DIAGNOSIS — M2450 Contracture, unspecified joint: Secondary | ICD-10-CM

## 2014-08-16 NOTE — Progress Notes (Addendum)
Patient: [name] [name] MRN: [id number] Sex: female DOB: [birth year]  Provider: Deetta Perla, MD Location of Care: [facility] Child [facility]  Note type: Routine return visit  History of Present Illness: Referral Source: Dr. Anselm Lis History from: patient and [facility] chart Chief Complaint: Problems Using Her Hands and Not Being Able to Lift Head  [name] [name] is a 38 y.o. who returns for evaluation and management of spastic, dystonic quadriparesis with progression of symptoms.  [name] [name] returns August 16, 2014, for the first time since Apr 12, 2014.  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  Her most recent CT scans August 24, 2010, and July 17, 2011, were normal.  She has a longstanding history of depression and anxiety.  She has experienced episodes of unresponsiveness that are most consistent with non-epileptic seizures.  She has migraine headaches, pulmonary embolism requiring lifetime anticoagulants.  She has experienced a long steady decline in her mobility.    She was to be able to walk about four and half years ago.  Once she stopped walking and crawling, she became essentially wheelchair bound in part because of weakness, deconditioning, and spasticity, in part because of contractures.  She used to use her left hand to manipulate her motorized wheelchair.  Now, she has to use a fist on the joystick in order to move herself.  This is both difficult and at times dangerous.  She is able to extend her fingers in the right hand, but has severe dystonia proximally in the right arm because of the arm is flexed and pulled backwards twisting her whole body to the right.  She returns today, because she was losing the ability to have any independent existence and wanted to know what else could be done to regain some of her lost function.  She has been a long time patient of [facility].  She went into a group home in the spring of  2012.  She had frequent blackouts and also a pulmonary embolus that required anticoagulation.  She transferred from [facility] to [facility] March 02, 2013.  Her spasticity is treated with intrathecal baclofen, oral baclofen, tizanidine, and Botox treatments.  Unfortunately, she has not had physical or occupational therapy on a regular basis for a long time.  She has also had no attention to her aging wheelchair.  Without ongoing therapy to stretch her contractures and compel movement, spasticity has taken an inexorable toll.  In addition, she has significant depression, anxiety, obsessive-compulsive disorder, and has been treated long-term with a variety of both antidepressant and anxiolytic medications.  Her general medical problems have been stable.  Neurologically, her deterioration is part of the natural course of spasticity that must be treated not only with pharmacologically, but with therapy.  Review of Systems: 12 system review was remarkable for weakness   Past Medical History  Diagnosis Date  . Cerebral palsy   . Pulmonary embolism     Lifetime Coumadin  . Cerebral palsy     Spastic Cerebral palsy, mentally intact  . Contracture, joint, multiple sites     Electric wheelchair, uses left hand to operate chair.   [name] Kitchen Hypertension   . OCD (obsessive compulsive disorder)   . Depression   . Migraine   . [name]   . Dysarthria   . Endometriosis   . History of recurrent UTIs   . GERD (gastroesophageal reflux disease)   . Pulmonary embolism 2011, 01/2011  Will be on lifetime coumadin  . Anemia   . Abdominal pain 01/02/2012    Overview:  Overview:  Per Previous pcp note- Dr. Ta--Pt has history of endometriosis and had DUB in 2011.  She She had u/s that showed normal endometrial stripe.  She had endometrial bx that was wnl.  She has a lot of abd cramping every month.  This cramping was sometimes not relieved with hydrocodone.  She was referred to  [facility] for IUD placement to help endometriosis and abd cramping.  [name]  . Macroscopic hematuria 03/10/2013    status post replacement of suprapubic tube 03/04/2013   . HYPERTENSION, BENIGN 01/31/2010    Qualifier: Diagnosis of  By: [name], MD, [name]    Hospitalizations: No., Head Injury: No., Nervous System Infections: No., Immunizations up to date: Yes.   Past Medical History See HPI  Birth History Extremely premature infant  Behavior History depression, anxiety  Surgical History Past Surgical History  Procedure Laterality Date  . Colposcopy  06/2000  . Cesarean section      x 2  . Tubal ligation  2003  . Carpal tunnel release  08/2008    Dr Teressa Senter  . Wrist surgery  06/2010    Dr Dierdre Searles, hand surgeon, [name]  . Baclofen pump refill      x 3 times  . Appendectomy    . Intrauterine device insertion  04/30/11    Inserted by [facility] for endometriosis  . Laparoscopically assisted vag hysterectomy  10/27/2012  . Multiple extractions with alveoloplasty Bilateral 07/19/2013    Procedure: EXTRACTIONS #4, 1,61,09,60;  Surgeon: Francene Finders, DDS;  Location: [facility] OR;  Service: Oral Surgery;  Laterality: Bilateral;  . Pain pump implantation N/A 03/07/2014    Procedure: baclofen pump revision/replacement and Catheter connection replacement;  Surgeon: Cristi Loron, MD;  Location: [facility] NEURO ORS;  Service: Neurosurgery;  Laterality: N/A;  baclofen pump revision/replacement and Catheter connection replacement  . Programable baclofen pump revision  03/17/14    Battery Replacement     Family History family history includes Alzheimer's disease in her paternal grandfather; Asthma in her father; Breast cancer in her paternal grandmother; Cancer in her paternal grandmother; Colon cancer in her maternal grandmother; Heart attack in her maternal grandfather. Family history is negative for migraines, seizures, intellectual disabilities, blindness, deafness, birth defects,  chromosomal disorder, or autism.  Social History History   Social History  . Marital Status: Divorced    Spouse Name: N/A    Number of Children: 2  . Years of Education: college   Occupational History  .    [name] UNEMPLOYED    Social History Main Topics  . Smoking status: Never Smoker   . Smokeless tobacco: Never Used  . Alcohol Use: 0.6 oz/week    1 Glasses of wine per week     Comment: Drinks alcohol once a month.  . Drug Use: No  . Sexual Activity: No   Other Topics Concern  . None   Social History Narrative   Ailene Ards, who has schizophrenia, MR, is father of daughter.  Homero Fellers and pt are no longer together.  Homero Fellers does not see [name]. Daughter is 48 yo, Stage manager, lives with pt.        [name] [phone]), lives with pt's parents.      Needs assistance with ADL's and IADL's--has personal care services 20 hrs/wk;       No tobacco, EtOH, drugs.      **  Patient very concerned about her kids being taken from her.      **Case Manager: Jack Quarto [phone]      Patient lives [place]    Educational level junior college  Living with  at [facility]  Hobbies/Interest: Enjoys talking on her phone School comments Satin graduated from [employer] in 2004 with a degree in Surveyor, minerals.   Allergies  Allergen Reactions  . Morphine Dermatitis    Skin turned red    Physical Exam BP 98/68  Pulse 96  Wt 132 lb (59.875 kg)  LMP 04/05/2011  General: alert, chronically ill, listing to the right side in her wheelchair limited movement, well nourished, in no acute distress, brown hair, brown eyes, right handed Head: normocephalic, no dysmorphic features Ears, Nose and Throat: Otoscopic: tympanic membranes normal; pharynx: oropharynx is pink without exudates or tonsillar hypertrophy Neck: supple, full range of motion, no cranial or cervical bruits Respiratory: auscultation clear Cardiovascular: no murmurs, pulses are normal Musculoskeletal: no  skeletal deformities or apparent scoliosis; Contractures at both wrists, unable to extend her right wrist in neutral, able to extend her left wrist to neutral;  Flexion contractures at her hips, markedly increased hip adductor tone Skin: no rashes or neurocutaneous lesions She has a suprapubic catheter to drain her bladder  Neurologic Exam  Mental Status: alert; oriented to person, place and year; knowledge is normal for age; language is normal; It is difficult to understand her because of severe oral motor apraxia. Cranial Nerves: visual fields are full to double simultaneous stimuli; extraocular movements are full and conjugate; pupils are around reactive to light; funduscopic examination shows sharp disc margins with normal vessels; symmetric facial strength; midline tongue and uvula, However she is only able to slightly protrude her tongue to midline and to the right; air conduction is greater than bone conduction bilaterally Motor: Spastic dystonic quadriparesis;  Right arm is flexed at the elbow and extended behind her body with her right hand in a clawhand this.  She is able to extend her fingers and grip with moderate weakness she has difficulty moving individual fingers.  The left arm sits in her last.  She is able to extend it from her body it is not able to extend her fingers.  Her fingers are tightly flexed but can be extended.  She has minimal movement of them.  She is not able to elevate either of her arms to her shoulders; she can extend her legs at the knees but does not get to a fully extended position.  She is not able to move her feet.  She can minimally abduct her legs at the hips.  The patient has spasticity in her 4 limbs, Ashworth 2 in her legs, Ashworth 3 in her arms with clawhand deformities in both hands right greater than left. She is severely dysarthric and at times is difficult to understand her. She has significant kyphosis of the neck and is unable to keep her head  upright.  Sensory: intact responses to cold, vibration; She has problems with stereognosis because she can't her fingers Coordination: Unable to test Gait and Station: Wheelchair-bound Reflexes: symmetric and diminished bilaterally because of cocontraction; no clonus  Assessment 1. 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 2. Contracted joints, multiple sites, 718.49. 3. Depression, major, recurrent, 296.30. 4. Presence of intrathecal baclofen pump, V45.89.  Discussion I am most concerned about the patient's deterioration in her ability to use her left hand.  I am not certain that this can be  reversed.  She does not have fixed contractures except at her wrist, which can only be dorsiflexed to neutral position on the left.  It can't be dorsiflexed to neutral position on the right, although she has more control over the flexion and extension of her fingers.  Baclofen is not a good medication to treat dystonia.  Intrathecal baclofen is only going to significantly affect the lower extremities and not the upper extremities, which is her biggest problem at this time.  Botox can only be given every three months or so, and it is unlikely to be able to reverse the spasticity in her left hand that makes it difficult for her to extend her fingers and open the hand.  My second major concern is that the patient is listing to the right side in her wheelchair and is uncomfortable much of the time.  In part, this is because the headrest is to the right at midline, in part it is because of the dystonia she has in her right arm that pulls the right arm back behind her and tends to twist her entire body.  I thought that I would see rather significant neuromuscular scoliosis, but to my surprise she does not have that.  Plan [name] [name] needs occupational and physical therapy once a week and an aide or some other caregiver to work with passive and active range of motion several times every day.  This is the only way that she will  regain any lost function.  I do not think that we can adjust her tizanidine or oral baclofen because they are already in very high doses.  I do not think that increasing her intrathecal baclofen will make a measurable difference.  Indeed it might decrease her tone further, which could make her even weaker.  She says that she cannot get her wheelchair worked on because Medicaid will not pay for it.  We will need to have assistance from the social workers to figure on out what can be done to have a durable Educational psychologist along with physical therapist reassess her wheelchair and come up with means to get her more upright and to keep her head stable and erect.  I will contact her physicians at [facility] to determine the next steps in an attempt to help the patient.  If we fail to intervene at this time, she is going to become completely dependent on others for all phases of her care.  I will see her in less than a week's time to empty, refill, and reprogram her intrathecal baclofen pump.    Based on today's experience, I need to see her every six months at that time when I am not refilling her pump to assess her fully and to make certain that she is receiving therapies and change in durable goods that will overall benefit her health.  I spent 40 minutes of face-to-face time with [name] [name], more than half of it in consultation.   Medication List       This list is accurate as of: 08/16/14 10:41 AM.           ABILIFY 5 MG tablet  Generic drug:  ARIPiprazole  Take 5 mg by mouth daily.     acetaminophen 325 MG tablet  Commonly known as:  TYLENOL  Take 650 mg by mouth every 8 (eight) hours.     baclofen 20 MG tablet  Commonly known as:  LIORESAL  Take 20 mg by mouth 4 (four) times daily.  clonazePAM 1 MG tablet  Commonly known as:  KLONOPIN  Take 1 tablet (1 mg total) by mouth 4 (four) times daily.     docusate sodium 100 MG capsule  Commonly known as:  COLACE  Take 100 mg by  mouth 2 (two) times daily.     HYDROcodone-acetaminophen 5-325 MG per tablet  Commonly known as:  NORCO/VICODIN  Take 1 tablet by mouth every 6 (six) hours as needed for moderate pain.     IMITREX 100 MG tablet  Generic drug:  SUMAtriptan  Take 100 mg by mouth every 2 (two) hours as needed for migraine or headache. May repeat in 2 hours if headache persists or recurs.     lamoTRIgine 200 MG tablet  Commonly known as:  LAMICTAL  Take 200 mg by mouth daily.     lidocaine 5 %  Commonly known as:  LIDODERM  Place 1 patch onto the skin daily. Remove & Discard patch within 12 hours or as directed by MD     polyethylene glycol packet  Commonly known as:  MIRALAX / GLYCOLAX  Take 17 g by mouth daily.     rivaroxaban 20 MG Tabs tablet  Commonly known as:  XARELTO  Take 20 mg by mouth daily with supper.     SEROQUEL 100 MG tablet  Generic drug:  QUEtiapine  Take 100 mg by mouth at bedtime.     sertraline 100 MG tablet  Commonly known as:  ZOLOFT  Take 200 mg by mouth daily.     tiZANidine 4 MG tablet  Commonly known as:  ZANAFLEX  Take 4 mg by mouth 3 (three) times daily.      The medication list was reviewed and reconciled. All changes or newly prescribed medications were explained.  A complete medication list was provided to the patient/caregiver.  Deetta Perla MD

## 2014-08-16 NOTE — Patient Instructions (Signed)
You have dystonic quadriparesis that has been present since your premature birth.  Over the years, I have seen progression of your symptoms despite an intrathecal baclofen pump, oral baclofen and tizanidine which are any spasticity agents, and Botox injections administered by Dr. Levert Feinstein.  Part of the problem is that you have not had regular physical or occupational therapy, something that lapsed a long time ago.  You are incapable of performing the exercises that are needed to improve your function because of your physical disability.  At very least you need a therapist oversee a caregiver who can provide assistance to you to move your limbs both passively and to work with you to actively move both limbs and digits that are not moving and therefore are becoming more stiff every day.  Passive and active range of motion need to be attempted at least 3 times a day.  You are listing to the right in your wheelchair.  It has been years since this has been altered.  Part of the problem is that the headrest is angled to the right, and the dystonic movements in your right arm pull your right arm backwards.  These 2 situations tend to pull you to the right.  You told me that there is no funding to have your wheelchair assessed and changed for your needs.  I don't understand the finding system, but I need to have a Child psychotherapist help me understand this and direct [place] to a means to have the your wheelchair reassessed and altered to meet your needs.  Increasing your intrathecal baclofen or your oral baclofen and tizanidine will not produce an acceptable result.  Botox may help improve your ability to extend your fingers on the left which is critical for you to be able to operate your motorized wheelchair.  I will see you for routine emptying refilling and reprogramming your intrathecal baclofen pump in late September.  In the interim, I hope that we can make progress in these areas.

## 2014-08-19 ENCOUNTER — Non-Acute Institutional Stay (INDEPENDENT_AMBULATORY_CARE_PROVIDER_SITE_OTHER): Payer: Medicaid Other | Admitting: Family Medicine

## 2014-08-19 DIAGNOSIS — I10 Essential (primary) hypertension: Secondary | ICD-10-CM

## 2014-08-19 DIAGNOSIS — G801 Spastic diplegic cerebral palsy: Secondary | ICD-10-CM

## 2014-08-19 DIAGNOSIS — Z9350 Unspecified cystostomy status: Secondary | ICD-10-CM

## 2014-08-19 DIAGNOSIS — Z9359 Other cystostomy status: Secondary | ICD-10-CM

## 2014-08-19 DIAGNOSIS — F339 Major depressive disorder, recurrent, unspecified: Secondary | ICD-10-CM

## 2014-08-19 DIAGNOSIS — G809 Cerebral palsy, unspecified: Secondary | ICD-10-CM

## 2014-08-19 NOTE — Progress Notes (Signed)
Patient ID: [name] [name], female   DOB: 1976-04-30, 38 y.o.   MRN: [id number]   [facility] Charlane Ferretti, MD Phone: [phone]  Subjective:  [name] [name] is a 38 y.o F with hx of CP and neurogenic bladder who currently resides in the nursing home   Patient's main concern today is that she will not get relief from injections   CP- on intrathecal baclofen pump; followed by Dr. Sharene Skeans. Last seen recently 08/16/14. Per Dr. Darl Householder note, significant concerns regarding functional status. Recommending PT/OT on a more regular basis. Not much room to increase her tizanidine or oral baclofen (given already high doses). Also concerns regarding comfort in her wheelchair. Is very saddened by this   Neurogenic bladder- followed closely by urology ([facility])  No concerns regarding her cath currently   Spasticity- followed by Dr. Terrace Arabia who recently increased her tizanidine to  QID. Has an appointment coming up for further Botox injections today. Would be interested in care at [facility] if they do not listen to her today   Depression- stopped wellbutrin in end of July. Has been feeling more emotional after this but overall better   Episode of lightheadedness- called after hours line 08/15/14. Message intercepted by Dr. Everlene Other. Vitals apparently stable at that time. No further episodes since then.  All relevant systems were reviewed and were negative unless otherwise noted in the HPI  Past Medical History Reviewed problem list.  Medications- reviewed and updated Current Outpatient Prescriptions  Medication Sig Dispense Refill  . acetaminophen (TYLENOL) 325 MG tablet Take 650 mg by mouth every 8 (eight) hours.       . ARIPiprazole (ABILIFY) 5 MG tablet Take 5 mg by mouth daily.      . baclofen (LIORESAL) 20 MG tablet Take 20 mg by mouth 4 (four) times daily.      . clonazePAM (KLONOPIN) 1 MG tablet Take 1 tablet (1 mg total) by mouth 4 (four) times daily.  120 tablet  3   . docusate sodium (COLACE) 100 MG capsule Take 100 mg by mouth 2 (two) times daily.      [name] Kitchen HYDROcodone-acetaminophen (NORCO/VICODIN) 5-325 MG per tablet Take 1 tablet by mouth every 6 (six) hours as needed for moderate pain.  60 tablet  0  . lamoTRIgine (LAMICTAL) 200 MG tablet Take 200 mg by mouth daily.      [name] Kitchen lidocaine (LIDODERM) 5 % Place 1 patch onto the skin daily. Remove & Discard patch within 12 hours or as directed by MD      . polyethylene glycol (MIRALAX / GLYCOLAX) packet Take 17 g by mouth daily.      . QUEtiapine (SEROQUEL) 100 MG tablet Take 100 mg by mouth at bedtime.      . rivaroxaban (XARELTO) 20 MG TABS tablet Take 20 mg by mouth daily with supper.      . sertraline (ZOLOFT) 100 MG tablet Take 200 mg by mouth daily.       . SUMAtriptan (IMITREX) 100 MG tablet Take 100 mg by mouth every 2 (two) hours as needed for migraine or headache. May repeat in 2 hours if headache persists or recurs.      [name] Kitchen tiZANidine (ZANAFLEX) 4 MG tablet Take 4 mg by mouth 3 (three) times daily.       No current facility-administered medications for this visit.    Chief complaint-noted No additions to family history Social history- patient is a never smoker  Objective: LMP 04/05/2011 BP  89/56 lying 133/80 sitting HR 83 RR 14 99% RA Gen: NAD, alert, lying in bed, drooling  HEENT: poor dentition; throat non erythematous, no exudates CV: RRR, good S1/S2, no murmur, cap refill <3 Resp: CTABL, no wheezes, non-labored Abd: SNTND, BS present,Suprapubic cath in place no induration or purulence, urine in catheter is clear Ext: contractures noted, worsened since last visit, having difficulty raising her head Neuro: Alert and oriented, No gross deficits, speech somewhat difficult to understand but is goal oriented and appropriate   Assessment/Plan: See problem based a/p

## 2014-08-22 ENCOUNTER — Encounter: Payer: Self-pay | Admitting: Pediatrics

## 2014-08-22 ENCOUNTER — Ambulatory Visit (INDEPENDENT_AMBULATORY_CARE_PROVIDER_SITE_OTHER): Payer: Medicaid Other | Admitting: Pediatrics

## 2014-08-22 VITALS — Wt 132.0 lb

## 2014-08-22 DIAGNOSIS — G803 Athetoid cerebral palsy: Secondary | ICD-10-CM

## 2014-08-22 DIAGNOSIS — G809 Cerebral palsy, unspecified: Secondary | ICD-10-CM

## 2014-08-22 NOTE — Progress Notes (Signed)
Patient: [name] [name] MRN: [id number] Sex: female DOB: [birth year]  Provider: Deetta Perla, MD Location of Care: [facility]  Note type: Routine return visit  History of Present Illness: Referral Source: Dr. Harvel Quale McGill History from: patient and [facility] chart Chief Complaint: Baclofen Pump Refill   [name] [name] is a 38 y.o. who returns for evaluation and management of spastic quadriparesis, and emptying, refilling, and reprogramming her intrathecal baclofen pump.  Procedure: emptying, refilling, and reprogramming her intrathecal baclofen pump.   The device was interrogated and showed a complex continuous infusion of baclofen that has a basal rate of 14.8. mcg per hour. The patient receives bolus doses of 27 mcg delivered at midnight, 6 AM, 12 noon, and 6 PM, for total of 449.7 mcg per day.  Her reservoir alarm date is February 06, 2015, or 168 days from now.  ERI 75 months.  The patient was sterilely prepped and draped. A 1-1/2 inch 22-gauge noncoring Huebner needle was inserted after several passes. With the patient's permission a telephone picture was made of her insertion location to help with the next procedure.  2.5 mL was withdrawn from the pump placing it under partial vacuum. 40 mL of baclofen (concentration 2000 mcg/mL) was instilled into the pump through a Millipore filter.  She tolerated the procedure well.  Review of Systems: 12 system review was unremarkable  Past Medical History  Diagnosis Date  . Cerebral palsy   . Pulmonary embolism     Lifetime Coumadin  . Cerebral palsy     Spastic Cerebral palsy, mentally intact  . Contracture, joint, multiple sites     Electric wheelchair, uses left hand to operate chair.   [name] Hypertension   . OCD (obsessive compulsive disorder)   . Depression   . Migraine   . [name]   . Dysarthria   . Endometriosis   . History of recurrent UTIs   . GERD (gastroesophageal reflux disease)   .  Pulmonary embolism 2011, 01/2011    Will be on lifetime coumadin  . Anemia   . Abdominal pain 01/02/2012    Overview:  Overview:  Per Previous pcp note- Dr. Ta--Pt has history of endometriosis and had DUB in 2011.  She She had u/s that showed normal endometrial stripe.  She had endometrial bx that was wnl.  She has a lot of abd cramping every month.  This cramping was sometimes not relieved with hydrocodone.  She was referred to [facility] for IUD placement to help endometriosis and abd cramping.  [name]  . Macroscopic hematuria 03/10/2013    status post replacement of suprapubic tube 03/04/2013   . HYPERTENSION, BENIGN 01/31/2010    Qualifier: Diagnosis of  By: [name], MD, [name]    Hospitalizations: No., Head Injury: No., Nervous System Infections: No., Immunizations up to date: Yes.    Birth History Extremely premature infant  Behavior History Anxiety/ depression  Surgical History Past Surgical History  Procedure Laterality Date  . Colposcopy  06/2000  . Cesarean section      x 2  . Tubal ligation  2003  . Carpal tunnel release  08/2008    Dr Teressa Senter  . Wrist surgery  06/2010    Dr Dierdre Searles, hand surgeon, [name]  . Baclofen pump refill      x 3 times  . Appendectomy    . Intrauterine device insertion  04/30/11    Inserted by [facility] for endometriosis  . Laparoscopically assisted vag hysterectomy  10/27/2012  . Multiple extractions with alveoloplasty Bilateral 07/19/2013    Procedure: EXTRACTIONS #4, 1,61,09,60;  Surgeon: Francene Finders, DDS;  Location: [facility] OR;  Service: Oral Surgery;  Laterality: Bilateral;  . Pain pump implantation N/A 03/07/2014    Procedure: baclofen pump revision/replacement and Catheter connection replacement;  Surgeon: Cristi Loron, MD;  Location: [facility];  Service: Neurosurgery;  Laterality: N/A;  baclofen pump revision/replacement and Catheter connection replacement  . Programable baclofen pump revision  03/17/14    Battery Replacement      Family History family history includes Alzheimer's disease in her paternal grandfather; Asthma in her father; Breast cancer in her paternal grandmother; Cancer in her paternal grandmother; Colon cancer in her maternal grandmother; Heart attack in her maternal grandfather. Family history is negative for migraines, seizures, intellectual disabilities, blindness, deafness, birth defects, chromosomal disorder, or autism.  Social History History   Social History  . Marital Status: Divorced    Spouse Name: N/A    Number of Children: 2  . Years of Education: college   Occupational History  .    [name] UNEMPLOYED    Social History Main Topics  . Smoking status: Never Smoker   . Smokeless tobacco: Never Used  . Alcohol Use: 0.6 oz/week    1 Glasses of wine per week     Comment: Drinks alcohol once a month.  . Drug Use: No  . Sexual Activity: No   Other Topics Concern  . None   Social History Narrative   [name] [name], who has schizophrenia, MR, is father of daughter.  Homero Fellers and pt are no longer together.  Homero Fellers does not see [name] [name]. Daughter is 79 yo, Stage manager, lives with pt.        [name] [name] [phone]), lives with pt's parents.      Needs assistance with ADL's and IADL's--has personal care services 20 hrs/wk;       No tobacco, EtOH, drugs.      **Patient very concerned about her kids being taken from her.      **Case Manager: Jack Quarto [phone]      Patient lives [place]   Educational level junior college Occupation: N/A Living with other residents at [employer]  Hobbies/Interest: Enjoys talking on her phone  School comments [name] [name] graduated from [employer] in 2004 with a degree in Surveyor, minerals.   Allergies  Allergen Reactions  . Morphine Dermatitis    Skin turned red    Physical Exam Wt 132 lb (59.875 kg)  LMP 04/05/2011  Examined within the past week.  See 08/16/14 note.   Assessment 1. 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  That may be true but it becomes problematic when resources are being spent on Botox and baclofen pump the results of which will not be optimized without regular physical and occupational therapy.  Plan Return visit for refilling in approximately 168 days.  I will see her shortly before that for an evaluation and physical examination.   Medication List       This list is accurate as of: 08/22/14 11:59 PM.           ABILIFY 5 MG tablet  Generic drug:  ARIPiprazole  Take 5 mg by mouth daily.     acetaminophen 325 MG tablet  Commonly known as:  TYLENOL  Take  650 mg by mouth every 8 (eight) hours.     baclofen 20 MG tablet  Commonly known as:  LIORESAL  Take 20 mg by mouth 4 (four) times daily.     clonazePAM 1 MG tablet  Commonly known as:  KLONOPIN  Take 1 tablet (1 mg total) by mouth 4 (four) times daily.     docusate sodium 100 MG capsule  Commonly known as:  COLACE  Take 100 mg by mouth 2 (two) times daily.     HYDROcodone-acetaminophen 5-325 MG per tablet  Commonly known as:  NORCO/VICODIN  Take 1 tablet by mouth every 6 (six) hours as needed for moderate pain.     IMITREX 100 MG tablet  Generic drug:  SUMAtriptan  Take 100 mg by mouth every 2 (two) hours as needed for migraine or headache. May repeat in 2 hours if headache persists or recurs.     lamoTRIgine 200 MG tablet  Commonly known as:  LAMICTAL  Take 200 mg by mouth daily.     lidocaine 5 %  Commonly known as:  LIDODERM  Place 1 patch onto the skin daily. Remove & Discard patch within 12 hours or as directed by MD     polyethylene glycol packet  Commonly known as:  MIRALAX / GLYCOLAX  Take 17 g by mouth daily.     rivaroxaban 20 MG Tabs tablet  Commonly known as:  XARELTO  Take 20 mg by mouth daily with supper.     SEROQUEL 100 MG tablet  Generic  drug:  QUEtiapine  Take 100 mg by mouth at bedtime.     sertraline 100 MG tablet  Commonly known as:  ZOLOFT  Take 200 mg by mouth daily.     tiZANidine 4 MG tablet  Commonly known as:  ZANAFLEX  Take 4 mg by mouth 4 (four) times daily.      The medication list was reviewed and reconciled. All changes or newly prescribed medications were explained.  A complete medication list was provided to the patient/caregiver.  Deetta Perla MD

## 2014-08-24 ENCOUNTER — Encounter: Payer: Self-pay | Admitting: Pharmacist

## 2014-08-24 ENCOUNTER — Encounter: Payer: Self-pay | Admitting: Neurology

## 2014-08-24 ENCOUNTER — Ambulatory Visit (INDEPENDENT_AMBULATORY_CARE_PROVIDER_SITE_OTHER): Payer: Medicaid Other | Admitting: Neurology

## 2014-08-24 DIAGNOSIS — G808 Other cerebral palsy: Secondary | ICD-10-CM

## 2014-08-24 DIAGNOSIS — R252 Cramp and spasm: Secondary | ICD-10-CM

## 2014-08-24 MED ORDER — ONABOTULINUMTOXINA 100 UNITS IJ SOLR
300.0000 [IU] | Freq: Once | INTRAMUSCULAR | Status: AC
  Administered 2014-08-24: 300 [IU] via INTRAMUSCULAR

## 2014-08-24 NOTE — Progress Notes (Signed)
HPI:   [name] [name] is a 38 year old Female with spastic and dystonic quadriparesis, depression/ anxiety, episodes where she loses contact with her world and that have been studied thoroughly and appear nonepileptic in nature, migraine headaches.  She is currently taking coumadin for her previous DVT, recurrent pulmonary emboli.  She came in for EMG guided BOTOX A injection, previously was done by Dr. Kelli Hope since Feb, 2006, received injection at [facility] prior to that.   BOTOX has always been to her right arm, where she often complains of spastic pain, her left arm even though has significant spasticity, is her functional arm.  She used her left arm to manipulate wheelchair, operating cellphone and feed herself.  She also has intrathecal baclofen pump. She used to be able to transfer herself in/out of wheelchair, living in NH now she has gradually worsening functional capacity over the past few month, especially since her recent hospital admission in August 2012. She complained of syncopy, there was associated hypoxia to 83%, CT angiogram of bran and neck were normal. EEG ws normal. abilify and klonopin dosage were decreased  She was discharged to nursing home now, she could no longer transfer herself in and out of wheelchair, she has less functional left arm.  UPDATE June 3rd 2015: Last injection was in Feb 2015, she received injection to her right arm, and left hand which has helped her a lot, there was no significant side effect noticed.  Today she complains of worsening left arm function, difficulty straight out fingers, wants more injection to her left hand, which is her functional hand   She was admitted to hospital in April the fifteenth for abdominal pain, likely related to constipations, CT showed large stool burden, she was treated with Colace, MiraLax, and enema, symptoms has resolved, she is to continue taking Kerin Salen for her pulmonary emboli  UPDATE Sept 23 2015: She had Botox  injection in June third 2015, she complains of difficulty straight out her left hand, increased difficulty using her left hand, which is her functional hand, operating wheelchair, want today's injection emphasize on her left hand.   Physical Examinations:  Mental Status: seated in wheelchair, slumped slightly, with head band and chest vest to hold her body posture.  Cranial Nerves: round minimally reactive pupils,  conjugate eye movement, dystonic but symmetric facial strength, difficulty protruding her tongue, severe dysarthria to the point of being in unintelligible at times Motor: right arm in shoulder external rotation, pronation, elbow extended, wrist and finger flexion, maximum 90 with right wrist extension. she has uncontrollable right shoulder posterior extension.  Left arm able to have antigravity movement, but slow movement under voluntary control, stayed in left elbow pronation, flexion. The left hand was fisted. Very slow release of her left finger flexion, with passive movement, she has full range of motion of left shoulder, elbow, and left fingers. She was able to move her legs against gravity 2/5.  Sensory: Withdrawal to pain Coordination:  poor coordination Gait and Station: spastic diplegia, in wheelchair   Assessment and Plan:  38 yo Caucasian female with spastic quadriplegia, had EMG guided Botox injection for bilateral upper extremity spasticity, continue to complain left hands forceful finger flexion, thumb in position,    Used 300 units of BOTOX A, 100 units/2cc normal saline.  Left flexor digitorum profundus 25 units Left flexor digitorum superficialis 25 Left opponents 12.5 Left hand lumricals for total of 37.5  Right biceps 25 Right brachialis 25 x2= 50 Right supraspinatus 25  Right rhomboid  25 Right flexor carpi ulnaris 25 Right palmaris longus 25 Right flexor digitorum profundus 25 units  She tolerated the injection well, will return to clinic in 3 months  for repeat injection

## 2014-08-24 NOTE — Assessment & Plan Note (Signed)
Chronic Am worried that progressive physical disability will push her over the edge Have stopped wellbutrin at last visit  Cont with abilify zoloft  May need to med adjust per [facility] if mood does not improve here

## 2014-08-24 NOTE — Assessment & Plan Note (Signed)
Does not appear infected at this time Continue to follow serially

## 2014-08-24 NOTE — Assessment & Plan Note (Signed)
Worsened from previous and is patient's main concern today PT/OT consults per Dr. Sharene Skeans (in definite agreement) Baclofen/tizandine (already at max doses) Botox injections today per Dr. Terrace Arabia  Could consider PMR referral/Neuro PT for chair assistance? Appreciate assistance of geri team in following this

## 2014-08-24 NOTE — Assessment & Plan Note (Signed)
One episode of lightheadedness since I last saw her Vitals stable at that time BP on lower side here while lying 80s/50s Not currently on anti-hypertensives Will cont to monitor closely

## 2014-08-26 ENCOUNTER — Encounter: Payer: Self-pay | Admitting: Pediatrics

## 2014-08-26 NOTE — Progress Notes (Signed)
Patient ID: [name] [name], female   DOB: 09/04/1976, 38 y.o.   MRN: [id number] I discussed with  Dr Marsh.  I agree with their plans documented in their regulatory visit note.  

## 2014-08-27 ENCOUNTER — Telehealth: Payer: Self-pay | Admitting: Family Medicine

## 2014-08-27 NOTE — Telephone Encounter (Signed)
Family Medicine After hours phone call  Received phone call from [facility] regarding med rec. Patient was seen at outside facility for ureterocystoscopy and nurse asking for clarifications on medications that were prescribed/discontinued. DC paperwork says to discontinue pyridium, loratadine, multivitamin; continue/start tramadol, lorazepam. Nurse states that everything but the multivitamin was not previously ordered for patient. I advised to continue the nursing home medications as previously ordered, specifically NOT give lorazepam as patient has scheduled klonopin already, and NOT give tramadol but can continue norco; stated I would forward this to [facility] team and they could review on Monday. Nurse verbalized understanding.  Tawni Carnes, MD 08/27/2014, 3:57 PM PGY-2, [facility]

## 2014-08-29 NOTE — Telephone Encounter (Signed)
I agree with Dr. Laban Emperor plan. These medications are duplicates on what she already has ordered so we will continue her previous meds.  Will sign orders at the [facility].   Murtis Sink, MD [facility] Resident, PGY-3 08/29/2014, 9:21 AM Geriatrics Team

## 2014-09-15 ENCOUNTER — Encounter: Payer: Self-pay | Admitting: Pharmacist

## 2014-09-28 ENCOUNTER — Telehealth: Payer: Self-pay | Admitting: *Deleted

## 2014-09-28 NOTE — Telephone Encounter (Signed)
I left a message for [name] [name] and asked her to call back. I tried calling her back to give her time to answer as she requested but it went to voicemail again. TG

## 2014-09-28 NOTE — Telephone Encounter (Signed)
[name] called and stated that her hands are not working and she wants an increase in Baclofen, I informed [name] that works form her waist down and it would not help her hands, she then asked another question that I was unable to make out after several attempts at trying. [name] would like a return call on her mobile at [phone], she says if she doesn't pick up to call her right back giving her a chance to answer her phone. She asked if Inetta Fermoina could call her.    Thanks,  Belenda CruiseMichelle B.

## 2014-09-28 NOTE — Telephone Encounter (Signed)
Okay, thanks [facility]. MB

## 2014-09-29 NOTE — Telephone Encounter (Signed)
[name] HongJudy has not called back. I will await her call. TG

## 2014-09-30 NOTE — Telephone Encounter (Signed)
[name] called back at 2:40pm from [phone]. I called back left a message at 4:33 pm.

## 2014-10-03 ENCOUNTER — Encounter (HOSPITAL_COMMUNITY): Payer: Self-pay | Admitting: Emergency Medicine

## 2014-10-03 ENCOUNTER — Non-Acute Institutional Stay (INDEPENDENT_AMBULATORY_CARE_PROVIDER_SITE_OTHER): Payer: Medicaid Other | Admitting: Family Medicine

## 2014-10-03 ENCOUNTER — Emergency Department (HOSPITAL_COMMUNITY)
Admission: EM | Admit: 2014-10-03 | Discharge: 2014-10-04 | Disposition: A | Discharge status: Home / Self Care | Payer: Medicaid Other | Attending: Emergency Medicine | Admitting: Emergency Medicine

## 2014-10-03 DIAGNOSIS — Z8739 Personal history of other diseases of the musculoskeletal system and connective tissue: Secondary | ICD-10-CM | POA: Insufficient documentation

## 2014-10-03 DIAGNOSIS — R1030 Lower abdominal pain, unspecified: Secondary | ICD-10-CM

## 2014-10-03 DIAGNOSIS — Z8719 Personal history of other diseases of the digestive system: Secondary | ICD-10-CM | POA: Diagnosis not present

## 2014-10-03 DIAGNOSIS — T8389XA Other specified complication of genitourinary prosthetic devices, implants and grafts, initial encounter: Secondary | ICD-10-CM | POA: Insufficient documentation

## 2014-10-03 DIAGNOSIS — Z8742 Personal history of other diseases of the female genital tract: Secondary | ICD-10-CM | POA: Diagnosis not present

## 2014-10-03 DIAGNOSIS — I10 Essential (primary) hypertension: Secondary | ICD-10-CM | POA: Insufficient documentation

## 2014-10-03 DIAGNOSIS — Y846 Urinary catheterization as the cause of abnormal reaction of the patient, or of later complication, without mention of misadventure at the time of the procedure: Secondary | ICD-10-CM | POA: Insufficient documentation

## 2014-10-03 DIAGNOSIS — F329 Major depressive disorder, single episode, unspecified: Secondary | ICD-10-CM | POA: Insufficient documentation

## 2014-10-03 DIAGNOSIS — F42 Obsessive-compulsive disorder: Secondary | ICD-10-CM | POA: Diagnosis not present

## 2014-10-03 DIAGNOSIS — Z86711 Personal history of pulmonary embolism: Secondary | ICD-10-CM | POA: Diagnosis not present

## 2014-10-03 DIAGNOSIS — Z9889 Other specified postprocedural states: Secondary | ICD-10-CM | POA: Diagnosis not present

## 2014-10-03 DIAGNOSIS — R109 Unspecified abdominal pain: Secondary | ICD-10-CM

## 2014-10-03 DIAGNOSIS — Z79899 Other long term (current) drug therapy: Secondary | ICD-10-CM | POA: Diagnosis not present

## 2014-10-03 DIAGNOSIS — N390 Urinary tract infection, site not specified: Secondary | ICD-10-CM | POA: Diagnosis not present

## 2014-10-03 DIAGNOSIS — G43909 Migraine, unspecified, not intractable, without status migrainosus: Secondary | ICD-10-CM | POA: Insufficient documentation

## 2014-10-03 DIAGNOSIS — Z7901 Long term (current) use of anticoagulants: Secondary | ICD-10-CM | POA: Diagnosis not present

## 2014-10-03 DIAGNOSIS — Z792 Long term (current) use of antibiotics: Secondary | ICD-10-CM | POA: Diagnosis not present

## 2014-10-03 DIAGNOSIS — Z862 Personal history of diseases of the blood and blood-forming organs and certain disorders involving the immune mechanism: Secondary | ICD-10-CM | POA: Insufficient documentation

## 2014-10-03 DIAGNOSIS — Z9359 Other cystostomy status: Secondary | ICD-10-CM

## 2014-10-03 NOTE — Assessment & Plan Note (Addendum)
Acute on chronic abd pain Likely due to bladder spasms after her catheter change earlier today Offered pain medication, considered vesicare, she would still like to be seen and evaluated in teh ED due to her severe pain currently.  Sent to ED for evaluation of her abd pain per her preference despite my offering treatments and work up at the nursing home.

## 2014-10-03 NOTE — ED Notes (Signed)
Patient arrives via EMS from [facility] nursing [facility] with complaint of abdominal pain which was reported to have started 3-4 hours ago. Patient stated that "it has been hurting for a while". Additionally urine appears blood tinged with clots in collection bag.

## 2014-10-03 NOTE — Telephone Encounter (Signed)
I called and spoke with [name] [name]. It was very difficult to understand but I believe that she was saying that her left hand was less functional and that she was not receiving any PT/OT services because her insurance would not pay for it. I told her that I would look into the situation and call her back in a day or so. TG

## 2014-10-03 NOTE — Telephone Encounter (Signed)
[name] HongJudy called at 3:33 pm to speak with you.

## 2014-10-03 NOTE — ED Notes (Signed)
MD at bedside. 

## 2014-10-03 NOTE — Progress Notes (Addendum)
Patient ID: [name] SlickerJudith [name], female   DOB: 12/18/1975, 38 y.o.   MRN: [id number]   Subjective:  Seen patient tonight in the nursing home due to a complaint of abdominal pain. She states that teh pain began this afternoon after her suprapubic catheter change. She describes it as a sharp persistent suprapubic pain without any alleviating factors. She denies any stool changes or feelings of constipation.   I offered her treatment including narcotic pain meds and she declines stating taht her pain is so severe she'd like to go to the ED. She is apologetic about needing to go.    Objective: Filed Vitals:   10/03/14 2301  BP: 138/91  Pulse: 103  Temp: 99.1 F (37.3 C)  Resp: 22   Gen: Cooperative with exam, clearly in pain CV: RRR, good S1/S2, no murmur, slightly tachy Resp: CTABL, no wheezes, non-labored Abd: soft only with tenderness to palpation of the suprapubic area, no guarding Neuro: Alert and conversational, contractures consistent with advanced CP  Assessment and plan:  38 y/o female with CP and Hx of neurogenic bladder requiring chronic suprapubic catheter with abd pain s/p catheter change this afternoon.   Abdominal pain Acute on chronic abd pain Likely due to bladder spasms after her catheter change earlier today Offered pain medication, considered vesicare, she would still like to be seen and evaluated in teh ED due to her severe pain currently.  Sent to ED for evaluation of her abd pain per her preference despite my offering treatments and work up at the nursing home.     I appreciate very much the ED provider's help in evaluating her abd pain.   Murtis SinkSam Jaleiah Asay, MD [facility] Resident, PGY-3 10/03/2014, 11:09 PM

## 2014-10-03 NOTE — ED Notes (Signed)
PT monitored by pulse ox, bp cuff, and 12-lead. 

## 2014-10-04 ENCOUNTER — Encounter (HOSPITAL_COMMUNITY): Payer: Self-pay | Admitting: Radiology

## 2014-10-04 ENCOUNTER — Emergency Department (HOSPITAL_COMMUNITY): Payer: Medicaid Other

## 2014-10-04 LAB — COMPREHENSIVE METABOLIC PANEL
ALT: 8 U/L (ref 0–35)
AST: 11 U/L (ref 0–37)
Albumin: 3.5 g/dL (ref 3.5–5.2)
Alkaline Phosphatase: 80 U/L (ref 39–117)
Anion gap: 12 (ref 5–15)
BUN: 10 mg/dL (ref 6–23)
CALCIUM: 8.9 mg/dL (ref 8.4–10.5)
CO2: 26 mEq/L (ref 19–32)
Chloride: 101 mEq/L (ref 96–112)
Creatinine, Ser: 0.47 mg/dL — ABNORMAL LOW (ref 0.50–1.10)
GFR calc Af Amer: >90 mL/min (ref >90)
GFR calc non Af Amer: >90 mL/min (ref >90)
Glucose, Bld: 114 mg/dL — ABNORMAL HIGH (ref 70–99)
Potassium: 4.1 mEq/L (ref 3.7–5.3)
SODIUM: 139 meq/L (ref 137–147)
TOTAL PROTEIN: 7.1 g/dL (ref 6.0–8.3)
Total Bilirubin: 0.2 mg/dL — ABNORMAL LOW (ref 0.3–1.2)

## 2014-10-04 LAB — URINALYSIS, ROUTINE W REFLEX MICROSCOPIC
Bilirubin Urine: NEGATIVE
GLUCOSE, UA: NEGATIVE mg/dL
Ketones, ur: NEGATIVE mg/dL
Nitrite: POSITIVE — AB
PH: 6.5 (ref 5.0–8.0)
Protein, ur: 100 mg/dL — AB
SPECIFIC GRAVITY, URINE: 1.009 (ref 1.005–1.030)
Urobilinogen, UA: 0.2 mg/dL (ref 0.0–1.0)

## 2014-10-04 LAB — CBC WITH DIFFERENTIAL/PLATELET
BASOS ABS: 0 10*3/uL (ref 0.0–0.1)
Basophils Relative: 0 % (ref 0–1)
EOS PCT: 2 % (ref 0–5)
Eosinophils Absolute: 0.2 10*3/uL (ref 0.0–0.7)
HCT: 34.7 % — ABNORMAL LOW (ref 36.0–46.0)
Hemoglobin: 10.7 g/dL — ABNORMAL LOW (ref 12.0–15.0)
Lymphocytes Relative: 21 % (ref 12–46)
Lymphs Abs: 1.9 10*3/uL (ref 0.7–4.0)
MCH: 24.9 pg — ABNORMAL LOW (ref 26.0–34.0)
MCHC: 30.8 g/dL (ref 30.0–36.0)
MCV: 80.7 fL (ref 78.0–100.0)
Monocytes Absolute: 0.6 10*3/uL (ref 0.1–1.0)
Monocytes Relative: 7 % (ref 3–12)
Neutro Abs: 6.3 10*3/uL (ref 1.7–7.7)
Neutrophils Relative %: 70 % (ref 43–77)
PLATELETS: 307 10*3/uL (ref 150–400)
RBC: 4.3 MIL/uL (ref 3.87–5.11)
RDW: 16.4 % — AB (ref 11.5–15.5)
WBC: 8.9 10*3/uL (ref 4.0–10.5)

## 2014-10-04 LAB — URINE MICROSCOPIC-ADD ON

## 2014-10-04 LAB — LIPASE, BLOOD: Lipase: 20 U/L (ref 11–59)

## 2014-10-04 MED ORDER — CEPHALEXIN 500 MG PO CAPS: 500.0000 mg | ORAL_CAPSULE | Freq: Two times a day (BID) | ORAL | Status: DC

## 2014-10-04 MED ORDER — IOHEXOL 300 MG/ML  SOLN
100.0000 mL | Freq: Once | INTRAMUSCULAR | Status: AC | PRN
  Administered 2014-10-04: 100 mL via INTRAVENOUS

## 2014-10-04 MED ORDER — HYDROMORPHONE HCL 1 MG/ML IJ SOLN
0.5000 mg | Freq: Once | INTRAMUSCULAR | Status: AC
  Administered 2014-10-04: 0.5 mg via INTRAVENOUS
  Filled 2014-10-04: qty 1

## 2014-10-04 NOTE — ED Provider Notes (Signed)
CSN: [id number]     Arrival date & time 10/03/14  2346 History   First MD Initiated Contact with Patient 10/03/14 2352     Chief Complaint  Patient presents with  . Abdominal Pain  . Hematuria     (Consider location/radiation/quality/duration/timing/severity/associated sxs/prior Treatment) HPI  [name] [name] is a 38 y.o. female with past medical history of cerebral palsy, hypertension, depression, pulmonary embolism on Coumadin coming in with abdominal pain. Patient states this began approximately 4 hours prior to arrival when they changed her suprapubic catheter. She admits to hematurias well. She denies any fevers, vomiting or diarrhea.patient was transferred here for worsening abdominal pain. Patient told EMS that her pain has been going on for longer than just today, but she denies this to me. Patient has no further complaints.  10 Systems reviewed and are negative for acute change except as noted in the HPI.    Past Medical History  Diagnosis Date  . Cerebral palsy   . Pulmonary embolism     Lifetime Coumadin  . Cerebral palsy     Spastic Cerebral palsy, mentally intact  . Contracture, joint, multiple sites     Electric wheelchair, uses left hand to operate chair.   [name] Kitchen Hypertension   . OCD (obsessive compulsive disorder)   . Depression   . Migraine   . [name]   . Dysarthria   . Endometriosis   . History of recurrent UTIs   . GERD (gastroesophageal reflux disease)   . Pulmonary embolism 2011, 01/2011    Will be on lifetime coumadin  . Anemia   . Abdominal pain 01/02/2012    Overview:  Overview:  Per Previous pcp note- Dr. Ta--Pt has history of endometriosis and had DUB in 2011.  She She had u/s that showed normal endometrial stripe.  She had endometrial bx that was wnl.  She has a lot of abd cramping every month.  This cramping was sometimes not relieved with hydrocodone.  She was referred to [facility] for IUD placement to help endometriosis and  abd cramping.  [name]  . Macroscopic hematuria 03/10/2013    status post replacement of suprapubic tube 03/04/2013   . HYPERTENSION, BENIGN 01/31/2010    Qualifier: Diagnosis of  By: [name], MD, [name]    Past Surgical History  Procedure Laterality Date  . Colposcopy  06/2000  . Cesarean section      x 2  . Tubal ligation  2003  . Carpal tunnel release  08/2008    Dr Teressa Senter  . Wrist surgery  06/2010    Dr Dierdre Searles, hand surgeon, [name]  . Baclofen pump refill      x 3 times  . Appendectomy    . Intrauterine device insertion  04/30/11    Inserted by [facility] for endometriosis  . Laparoscopically assisted vag hysterectomy  10/27/2012  . Multiple extractions with alveoloplasty Bilateral 07/19/2013    Procedure: EXTRACTIONS #4, 1,47,82,95;  Surgeon: Francene Finders, DDS;  Location[facility] OR;  Service: Oral Surgery;  Laterality: Bilateral;  . Pain pump implantation N/A 03/07/2014    Procedure: baclofen pump revision/replacement and Catheter connection replacement;  Surgeon: Cristi Loron, MD;  Location: [facility];  Service: Neurosurgery;  Laterality: N/A;  baclofen pump revision/replacement and Catheter connection replacement  . Programable baclofen pump revision  03/17/14    Battery Replacement    Family History  Problem Relation Age of Onset  . Asthma Father   . Colon  cancer Maternal Grandmother     Died in her 5260's  . Cancer Paternal Grandmother   . Breast cancer Paternal Grandmother     Died in her 7370's  . Heart attack Maternal Grandfather     Died in his 6170's  . Alzheimer's disease Paternal Grandfather     Died in his 980's   History  Substance Use Topics  . Smoking status: Never Smoker   . Smokeless tobacco: Never Used  . Alcohol Use: 0.6 oz/week    1 Glasses of wine per week     Comment: Drinks alcohol once a month.   OB History    No data available     Review of Systems    Allergies  Morphine  Home Medications   Prior to Admission medications    Medication Sig Start Date End Date Taking? Authorizing Provider  acetaminophen (TYLENOL) 325 MG tablet Take 650 mg by mouth every 8 (eight) hours.     Historical Provider, MD  acetic acid (VOSOL) 2 % otic solution Place 4 drops into both ears once a week.    Historical Provider, MD  ARIPiprazole (ABILIFY) 5 MG tablet Take 5 mg by mouth daily.    Historical Provider, MD  bacitracin-polymyxin b (POLYSPORIN) ointment Apply 1 application topically 2 (two) times daily as needed (Redness around cath site).    Historical Provider, MD  baclofen (LIORESAL) 20 MG tablet Take 20 mg by mouth 4 (four) times daily.    Historical Provider, MD  cetirizine (ZYRTEC) 5 MG tablet Take 5 mg by mouth at bedtime.    Historical Provider, MD  clonazePAM (KLONOPIN) 1 MG tablet Take 1 tablet (1 mg total) by mouth 4 (four) times daily. 08/01/14   Leighton Roachodd D McDiarmid, MD  diazepam (VALIUM) 5 MG tablet Take 5 mg by mouth every 6 (six) hours as needed for muscle spasms.    Historical Provider, MD  docusate sodium (COLACE) 100 MG capsule Take 100 mg by mouth 2 (two) times daily.    Historical Provider, MD  HYDROcodone-acetaminophen (NORCO/VICODIN) 5-325 MG per tablet Take 1 tablet by mouth every 6 (six) hours as needed (pain in both knees).    Historical Provider, MD  ibuprofen (ADVIL,MOTRIN) 400 MG tablet Take 400 mg by mouth every 8 (eight) hours as needed for mild pain.    Historical Provider, MD  lamoTRIgine (LAMICTAL) 200 MG tablet Take 200 mg by mouth daily.    Historical Provider, MD  lidocaine (LIDODERM) 5 % Place 1 patch onto the skin daily. Remove & Discard patch within 12 hours or as directed by MD    Historical Provider, MD  Multiple Vitamin (MULTIVITAMIN) tablet Take 1 tablet by mouth daily.    Historical Provider, MD  polyethylene glycol (MIRALAX / GLYCOLAX) packet Take 17 g by mouth daily.    Historical Provider, MD  QUEtiapine (SEROQUEL) 100 MG tablet Take 100 mg by mouth at bedtime.    Historical Provider, MD   rivaroxaban (XARELTO) 20 MG TABS tablet Take 20 mg by mouth daily with supper.    Historical Provider, MD  sertraline (ZOLOFT) 100 MG tablet Take 200 mg by mouth daily.     Historical Provider, MD  SUMAtriptan (IMITREX) 100 MG tablet Take 100 mg by mouth every 2 (two) hours as needed for migraine or headache. May repeat in 2 hours if headache persists or recurs.    Historical Provider, MD  tiZANidine (ZANAFLEX) 4 MG tablet Take 4 mg by mouth 4 (four) times daily.  05/12/14   Levert FeinsteinYijun Yan, MD   BP 118/70 mmHg  Pulse 79  Temp(Src) 98.2 F (36.8 C) (Oral)  Resp 9  SpO2 82%  LMP 04/05/2011 Physical Exam  Constitutional: She is oriented to person, place, and time. She appears well-developed and well-nourished. No distress.  HENT:  Head: Normocephalic and atraumatic.  Nose: Nose normal.  Mouth/Throat: Oropharynx is clear and moist. No oropharyngeal exudate.  Eyes: Conjunctivae and EOM are normal. Pupils are equal, round, and reactive to light. No scleral icterus.  Neck: Neck supple. No JVD present. No tracheal deviation present. No thyromegaly present.  Cardiovascular: Normal rate, regular rhythm and normal heart sounds.  Exam reveals no gallop and no friction rub.   No murmur heard. Pulmonary/Chest: Effort normal and breath sounds normal. No respiratory distress. She has no wheezes. She exhibits no tenderness.  Abdominal: Soft. Bowel sounds are normal. She exhibits no distension and no mass. There is tenderness. There is no rebound and no guarding.  Suprapubic cath in place. There is tenderness to palpation. There is drainage noted as well, does not appear purulent. Catheter is draining into the Foley bag.  Lymphadenopathy:    She has no cervical adenopathy.  Neurological: She is alert and oriented to person, place, and time.  Skin: Skin is warm and dry. No rash noted. She is not diaphoretic. No erythema. No pallor.  Nursing note and vitals reviewed.   ED Course  Procedures (including  critical care time) Labs Review Labs Reviewed  CBC WITH DIFFERENTIAL - Abnormal; Notable for the following:    Hemoglobin 10.7 (*)    HCT 34.7 (*)    MCH 24.9 (*)    RDW 16.4 (*)    All other components within normal limits  COMPREHENSIVE METABOLIC PANEL - Abnormal; Notable for the following:    Glucose, Bld 114 (*)    Creatinine, Ser 0.47 (*)    Total Bilirubin 0.2 (*)    All other components within normal limits  URINALYSIS, ROUTINE W REFLEX MICROSCOPIC - Abnormal; Notable for the following:    APPearance CLOUDY (*)    Hgb urine dipstick LARGE (*)    Protein, ur 100 (*)    Nitrite POSITIVE (*)    Leukocytes, UA LARGE (*)    All other components within normal limits  URINE MICROSCOPIC-ADD ON - Abnormal; Notable for the following:    Bacteria, UA MANY (*)    All other components within normal limits  URINE CULTURE  LIPASE, BLOOD    Imaging Review Ct Abdomen Pelvis W Contrast  10/04/2014   CLINICAL DATA:  Initial evaluation for acute abdominal pain.  EXAM: CT ABDOMEN AND PELVIS WITH CONTRAST  TECHNIQUE: Multidetector CT imaging of the abdomen and pelvis was performed using the standard protocol following bolus administration of intravenous contrast.  CONTRAST:  100mL OMNIPAQUE IOHEXOL 300 MG/ML  SOLN  COMPARISON:  Prior study from earlier same day.  FINDINGS: Mild bibasilar atelectasis seen dependently within the lung bases, right greater than left. There is a trace right pleural effusion.  Liver demonstrates a normal contrast enhanced appearance. Subtle heterogeneous hyperdensity within the gallbladder lumen may reflect stones and/ or sludge. Common bile duct mildly dilated up to 7.8 mm. Delete that  Spleen, adrenal glands, and pancreas demonstrate a normal contrast enhanced appearance.  Simple 1.7 cm hypodense lesion within the left kidney measures intermediate density at 35 Hounsfield units, indeterminate. No nephrolithiasis or hydronephrosis.  Stomach within normal limits. No  evidence of bowel obstruction. Moderate to large  amount of retained stool seen within the distal colon, suggesting constipation. No abnormal wall thickening, inflammatory stranding, or abnormal enhancement seen about the bowels to suggest active inflammation.  Suprapubic catheter in place within the bladder. Gas lucency seen within the nondependent portion of the bladder lumen. Circumferential bladder wall thickening may be related to incomplete distension, chronic outlet obstruction, or possibly acute cystitis.  Uterus is absent.  Ovaries within normal limits.  No free air or fluid. No adenopathy. Normal intravascular enhancement seen throughout the abdomen and pelvis.  Generator for baclofen pump present within the subcutaneous fat of the right anterior abdomen. The electrodes terminate at the T10 level. No acute osseous abnormality. No worrisome lytic or blastic osseous lesions. Scoliosis noted.  IMPRESSION: 1. Suprapubic catheter in place. Circumferential bladder wall thickening may be related to incomplete distension, chronic outlet obstruction, or possibly acute cystitis. Correlation with urinalysis recommended. 2. Moderate to large amount of retained stool within the rectosigmoid colon, suggesting constipation. 3. Subtle heterogeneous signal intensity within the gallbladder lumen, suggesting cholelithiasis. Common bile duct mildly prominent measuring up to 7-8 mm. No CT evidence for acute cholecystitis. 4. 1.7 cm intermediate density lesion within the left kidney. While this finding likely reflects a proteinaceous or hemorrhagic cyst, possible underlying renal malignancy is not entirely excluded. Further evaluation with renal mass protocol MRI recommended. This could be performed on a nonemergent basis. 5. Trace right pleural effusion.   Electronically Signed   By: Rise Mu M.D.   On: 10/04/2014 02:14   Dg Abd 2 Views  10/04/2014   CLINICAL DATA:  Abdominal pain for 4 hr.  Blood tinged urine.   EXAM: ABDOMEN - 2 VIEW  COMPARISON:  03/11/2013  FINDINGS: Electronic device with spinal catheter or stimulator. Scattered gas throughout the small and large bowel without distention. Changes most likely represent mild ileus. No radiopaque stones. Visualized bones appear intact. No free intra-abdominal air. No abnormal air-fluid levels.  IMPRESSION: Gas-filled nondistended small and large bowel most consistent with mild ileus.   Electronically Signed   By: Burman Nieves M.D.   On: 10/04/2014 00:45     EKG Interpretation None      MDM   Final diagnoses:  Suprapubic catheter    Patient presents emergency department for new abdominal pain after catheter placement. She was given pain medications, we will send a urinalysis, CT scan ordered for complication of suprapubic placement. I have low suspicion for any significant injury as the bag is filling with urine.  Urinalysis does reveal an infection, however she does have a chronic Foley. Culture was sent. CT scan does not reveal any recent from Foley placement. Will treat with Keflex according to her most recent urine cultures and discharged back to her nursing facility. No ileus or obstruction seen on CT scandespite air-fluid levels seen on x-ray.   Tomasita Crumble, MD 10/04/14 [phone]

## 2014-10-04 NOTE — Discharge Instructions (Signed)
Abdominal Pain [name] [name], You were seen today for abdominal pain. Your CT scan did not show any abnormalities with your catheter. Your CT scan did show a mass on your kidney that needs to be evaluated for possible cancer.  Her CT findings are below.Continue to take antibiotics as prescribed for your urine infection and follow up with your primary care physician within 3 days for continued treatment.thank you.  IMPRESSION: 1. Suprapubic catheter in place. Circumferential bladder wall thickening may be related to incomplete distension, chronic outlet obstruction, or possibly acute cystitis. Correlation with urinalysis recommended. 2. Moderate to large amount of retained stool within the rectosigmoid colon, suggesting constipation. 3. Subtle heterogeneous signal intensity within the gallbladder lumen, suggesting cholelithiasis. Common bile duct mildly prominent measuring up to 7-8 mm. No CT evidence for acute cholecystitis. 4. 1.7 cm intermediate density lesion within the left kidney. While this finding likely reflects a proteinaceous or hemorrhagic cyst, possible underlying renal malignancy is not entirely excluded. Further evaluation with renal mass protocol MRI recommended. This could be performed on a nonemergent basis. 5. Trace right pleural effusion. Many things can cause belly (abdominal) pain. Most times, the belly pain is not dangerous. Many cases of belly pain can be watched and treated at home. HOME CARE   Do not take medicines that help you go poop (laxatives) unless told to by your doctor.  Only take medicine as told by your doctor.  Eat or drink as told by your doctor. Your doctor will tell you if you should be on a special diet. GET HELP IF:  You do not know what is causing your belly pain.  You have belly pain while you are sick to your stomach (nauseous) or have runny poop (diarrhea).  You have pain while you pee or poop.  Your belly pain wakes you up at  night.  You have belly pain that gets worse or better when you eat.  You have belly pain that gets worse when you eat fatty foods.  You have a fever. GET HELP RIGHT AWAY IF:   The pain does not go away within 2 hours.  You keep throwing up (vomiting).  The pain changes and is only in the right or left part of the belly.  You have bloody or tarry looking poop. MAKE SURE YOU:   Understand these instructions.  Will watch your condition.  Will get help right away if you are not doing well or get worse. Document Released: 05/06/2008 Document Revised: 11/23/2013 Document Reviewed: 07/28/2013 [facility] LLCExitCare Patient Information 2015 [place], [place]. This information is not intended to replace advice given to you by your health care provider. Make sure you discuss any questions you have with your health care provider. Urinary Tract Infection A urinary tract infection (UTI) can occur any place along the urinary tract. The tract includes the kidneys, ureters, bladder, and urethra. A type of germ called bacteria often causes a UTI. UTIs are often helped with antibiotic medicine.  HOME CARE   If given, take antibiotics as told by your doctor. Finish them even if you start to feel better.  Drink enough fluids to keep your pee (urine) clear or pale yellow.  Avoid tea, drinks with caffeine, and bubbly (carbonated) drinks.  Pee often. Avoid holding your pee in for a long time.  Pee before and after having sex (intercourse).  Wipe from front to back after you poop (bowel movement) if you are a woman. Use each tissue only once. GET HELP RIGHT AWAY IF:  You have back pain.  You have lower belly (abdominal) pain.  You have chills.  You feel sick to your stomach (nauseous).  You throw up (vomit).  Your burning or discomfort with peeing does not go away.  You have a fever.  Your symptoms are not better in 3 days. MAKE SURE YOU:   Understand these instructions.  Will watch your  condition.  Will get help right away if you are not doing well or get worse. Document Released: 05/06/2008 Document Revised: 08/12/2012 Document Reviewed: 06/18/2012 [facility]Exit[facility] Patient Information 2015 [place], [place]. This information is not intended to replace advice given to you by your health care provider. Make sure you discuss any questions you have with your health care provider.

## 2014-10-04 NOTE — ED Notes (Signed)
[facility] contacted to tx patient back to [facility] 

## 2014-10-05 LAB — URINE CULTURE: Colony Count: >=100000

## 2014-10-10 ENCOUNTER — Encounter: Payer: Self-pay | Admitting: Family Medicine

## 2014-10-10 DIAGNOSIS — K5909 Other constipation: Secondary | ICD-10-CM | POA: Insufficient documentation

## 2014-10-11 ENCOUNTER — Encounter: Payer: Self-pay | Admitting: Family Medicine

## 2014-10-11 ENCOUNTER — Non-Acute Institutional Stay: Payer: Medicaid Other | Admitting: Family Medicine

## 2014-10-11 DIAGNOSIS — N319 Neuromuscular dysfunction of bladder, unspecified: Secondary | ICD-10-CM

## 2014-10-11 DIAGNOSIS — G249 Dystonia, unspecified: Secondary | ICD-10-CM

## 2014-10-11 NOTE — Progress Notes (Signed)
Patient ID: [name] SlickerJudith [name], female   DOB: 1976/04/17, 38 y.o.   MRN: [id number] [facility]  Visit  Provider: Anselm LisMelanie Marsh, MD [facility] Care[facility] Visit Information: a scheduled routine follow-up visit Patient accompanied by patient Information gathered from patient, EMR, patient's [name], Victorino DikeJennifer, patient's floor nurse..  Chief Complaint:  Chief Complaint  Patient presents with  . Regulatory Visit   HISTORY OF PRESENT ILLNESS:  Suprapubic pain - Onset after Botox injection of bladder last week - Brief, sharp pain in lower abdomen - Frequency: severe times a day - No radiation - No improvement with Keflex prescribed by ED for UTI on 10/04/14.  - ED Urine culture 11/3 grew >10^5 multimorphs. - Pain similar to pain after Bladder Botox injections previously  Left hand Spasticity - Longstanding problem - progressively worsening - OT reports decreased strength in left hand along with increased tone.  - Patient wearing left hand brace only at night.  She does not like wearing the hand brace in day because of it interfering with her driving her motorized WC using its joystick control.  - Patient received Botox injection into left hand by Dr Terrace ArabiaYan less than 3 months ago.  Patient did not think the Botox injection helped the left hand's function.     Outpatient Encounter Prescriptions as of 10/11/2014  Medication Sig  . acetaminophen (TYLENOL) 325 MG tablet Take 650 mg by mouth every 8 (eight) hours.   [name] Kitchen. acetic acid (VOSOL) 2 % otic solution Place 4 drops into both ears once a week.  . ARIPiprazole (ABILIFY) 5 MG tablet Take 5 mg by mouth daily.  . bacitracin-polymyxin b (POLYSPORIN) ointment Apply 1 application topically 2 (two) times daily as needed (Redness around cath site).  . baclofen (LIORESAL) 20 MG tablet Take 20 mg by mouth 4 (four) times daily.  . cetirizine (ZYRTEC) 5 MG tablet Take 5 mg by mouth at bedtime.  . clonazePAM (KLONOPIN) 1 MG  tablet Take 1 tablet (1 mg total) by mouth 4 (four) times daily.  . diazepam (VALIUM) 5 MG tablet Take 5 mg by mouth every 6 (six) hours as needed for muscle spasms.  [name] Kitchen. docusate sodium (COLACE) 100 MG capsule Take 100 mg by mouth 2 (two) times daily.  [name] Kitchen. HYDROcodone-acetaminophen (NORCO/VICODIN) 5-325 MG per tablet Take 1 tablet by mouth every 6 (six) hours as needed (pain in both knees).  [name] Kitchen. ibuprofen (ADVIL,MOTRIN) 400 MG tablet Take 400 mg by mouth every 8 (eight) hours as needed for mild pain.  [name] Kitchen. lamoTRIgine (LAMICTAL) 200 MG tablet Take 200 mg by mouth daily.  [name] Kitchen. lidocaine (LIDODERM) 5 % Place 1 patch onto the skin daily. Remove & Discard patch within 12 hours or as directed by MD  . Multiple Vitamin (MULTIVITAMIN) tablet Take 1 tablet by mouth daily.  . polyethylene glycol (MIRALAX / GLYCOLAX) packet Take 17 g by mouth daily.  . QUEtiapine (SEROQUEL) 100 MG tablet Take 100 mg by mouth at bedtime.  . rivaroxaban (XARELTO) 20 MG TABS tablet Take 20 mg by mouth daily with supper.  . sertraline (ZOLOFT) 100 MG tablet Take 200 mg by mouth daily.   . SUMAtriptan (IMITREX) 100 MG tablet Take 100 mg by mouth every 2 (two) hours as needed for migraine or headache. May repeat in 2 hours if headache persists or recurs.  [name] Kitchen. tiZANidine (ZANAFLEX) 4 MG tablet Take 4 mg by mouth 4 (four) times daily.   . [DISCONTINUED] cephALEXin (KEFLEX) 500 MG capsule Take 1 capsule (  500 mg total) by mouth 2 (two) times daily.   History Patient Active Problem List   Diagnosis Date Noted  . Constipation, chronic 10/10/2014  . Athetoid cerebral palsy 04/13/2014  . Dystonia 04/12/2014  . Constipation 03/16/2014  . Presence of intrathecal baclofen pump 03/07/2014  . Spasticity 01/05/2014  . Nursing home resident 09/16/2013  . Dental caries 07/18/2013  . Multiple thyroid nodules 05/20/2013  . Osteoarthrosis 04/07/2013  . Anemia 04/07/2013  . [name]   . Dysarthria   . GERD (gastroesophageal  reflux disease)   . Gross hematuria 03/12/2013  . Neurogenic bladder 02/05/2013  . Anxiety state 10/21/2012  . Healed or old pulmonary embolism 10/21/2012  . History of anticoagulant therapy 10/21/2012  . Chronic pain syndrome 06/17/2012  . Abdominal pain 01/02/2012  . Intracervical pessary 05/07/2011  . Recurrent pulmonary embolism 05/03/2011  . Chronic suprapubic catheter 03/15/2011  . Dyspnea and respiratory abnormality 02/04/2011  . Seborrheic keratosis 01/18/2011  . Long term (current) use of anticoagulants 01/12/2011  . Disease of female genital organs 10/24/2010  . WRIST PAIN, LEFT 03/20/2010  . HYPERTENSION, BENIGN 01/31/2010  . CP (cerebral palsy), spastic 12/27/2008  . Contracted, joint, multiple sites 12/27/2008  . DEPRESSION, MAJOR, RECURRENT 01/29/2007  . OBSESSIVE COMPUL. DISORDER 01/29/2007  . Infantile cerebral palsy 01/29/2007  . Anancastic neurosis 01/29/2007   Past Medical History  Diagnosis Date  . Cerebral palsy   . Pulmonary embolism     Lifetime Coumadin  . Cerebral palsy     Spastic Cerebral palsy, mentally intact  . Contracture, joint, multiple sites     Electric wheelchair, uses left hand to operate chair.   [name] Kitchen Hypertension   . OCD (obsessive compulsive disorder)   . Depression   . Migraine   . [name]   . Dysarthria   . Endometriosis   . History of recurrent UTIs   . GERD (gastroesophageal reflux disease)   . Pulmonary embolism 2011, 01/2011    Will be on lifetime coumadin  . Anemia   . Abdominal pain 01/02/2012    Overview:  Overview:  Per Previous pcp note- Dr. Ta--Pt has history of endometriosis and had DUB in 2011.  She She had u/s that showed normal endometrial stripe.  She had endometrial bx that was wnl.  She has a lot of abd cramping every month.  This cramping was sometimes not relieved with hydrocodone.  She was referred to [facility] for IUD placement to help endometriosis and abd cramping.  [name]  .  Macroscopic hematuria 03/10/2013    status post replacement of suprapubic tube 03/04/2013   . HYPERTENSION, BENIGN 01/31/2010    Qualifier: Diagnosis of  By: [name], MD, [name] History of endometriosis 10/2012    OBGYN [facility]: Laparoscopic Endometrial Ablation, [facility],   Past Surgical History  Procedure Laterality Date  . Colposcopy  06/2000  . Cesarean section      x 2  . Tubal ligation  2003  . Carpal tunnel release  08/2008    Dr Teressa Senter  . Wrist surgery  06/2010    Dr Dierdre Searles, hand surgeon, [name]  . Baclofen pump refill      x 3 times  . Appendectomy    . Intrauterine device insertion  04/30/11    Inserted by [facility] for endometriosis  . Laparoscopically assisted vag hysterectomy  10/27/2012  . Multiple extractions with alveoloplasty Bilateral 07/19/2013    Procedure: EXTRACTIONS #4, 1,61,09,60;  Surgeon: Francene Findershristopher L Albion, DDS;  [facility];  Service: Oral Surgery;  Laterality: Bilateral;  . Pain pump implantation N/A 03/07/2014    Procedure: baclofen pump revision/replacement and Catheter connection replacement;  Surgeon: Cristi LoronJeffrey D Jenkins, MD;  Location: [facility] ORS;  Service: Neurosurgery;  Laterality: N/A;  baclofen pump revision/replacement and Catheter connection replacement  . Programable baclofen pump revision  03/17/14    Battery Replacement    Family History  Problem Relation Age of Onset  . Asthma Father   . Colon cancer Maternal Grandmother     Died in her 3060's  . Cancer Paternal Grandmother   . Breast cancer Paternal Grandmother     Died in her 7970's  . Heart attack Maternal Grandfather     Died in his 8170's  . Alzheimer's disease Paternal Grandfather     Died in his 5880's    reports that she has never smoked. She has never used smokeless tobacco. She reports that she drinks about 0.6 oz of alcohol per week. She reports that she does not use illicit drugs.  Basic Activities of Daily Living and Instrumental Activities of Daily Living  eating,  bathing, toileting, personal cares, grooming, hygiene, dressing upper body, dressing lower body, meal preparation and taking own medications   Review of Systems  See HPI General: Denies fevers, chills, weight loss, fatigue, weight gain.  Eyes: Denies pain, blurred vision, discharge.  Ears/Nose/Throat: Denies ear pain, throat pain, rhinorrhea, nasal congestion.  Cardiovascular: Denies complaints, chest pains, palpitations, dyspnea on exertion, orthopnea, peripheral edema.  Respiratory: Denies cough, sputum, dyspnea.  Gastrointestinal: Denies abd pain, bloating, constipation, diarrhea.  Genitourinary: Denies dysuria, urinary frequency, discharge, pelvic pain.  Musculoskeletal: Denies joint pain, swelling, weakness.  Skin: Denies skin rash or ulcers. Neurologic: Denies transient paralysis, weakness, paresthesias, headache.  Psychiatric: Denies depression, anxiety, psychosis. Endocrine: Denies weight change.   PHYSICAL EXAM:. Wt Readings from Last 3 Encounters:  08/22/14 132 lb (59.875 kg)  08/16/14 132 lb (59.875 kg)  05/03/14 123 lb 11.2 oz (56.11 kg)   Temp Readings from Last 3 Encounters:  10/04/14 98.9 F (37.2 C) Oral  10/03/14 99.1 F (37.3 C)   04/30/14 97.7 F (36.5 C)    BP Readings from Last 3 Encounters:  10/04/14 104/60  10/03/14 138/91  08/16/14 98/68   Pulse Readings from Last 3 Encounters:  10/04/14 75  10/03/14 103  08/16/14 96    General: In motorized WC, able to navigate WC safely into her room from the hall using ulnar edge of her fisted left hand. HEENT:  Head supported in strap  CV:  RRR, no murmur RESP: No resp distress or accessory muscle use.  Clear to ausc bilat. No wheezing, no rales, no rhonchi.  ABD:  Soft, Non-tender, non-distended, +bowel sounds, no masses EXT:     Left hand: Able to slowly release all 5 digits with gentle slow sustained pressure.  Neurologic:  Dysarthric speech, but with complex and abstract content expressed. Intact  long-term and short-term memories.    Assessment and Plan:   See Problem List for individual problem's assessment and plans.   Code Status:    Full Code     Follow Up:  Next 60 days unless acute issues arise.

## 2014-10-11 NOTE — Assessment & Plan Note (Signed)
Dystonia of left Hand progressively worsening Combination of weakness in left hand and increased tone in left hand will make slowing or halting the loss of function in left hand difficult OT may try e-stim to hand to improve strength. [facility] If strength can improve, then OT might recommend a dynamic hand splint that patient could try to use during the day to allow her to drive her motorized WC safely while wearing a brace.

## 2014-10-11 NOTE — Assessment & Plan Note (Signed)
Recurrent pain in lower abdomen after bladder botox injection  No improvement after ED treated empirically for UTI with Keflex.  Urine culture from ED grew multimorphs c/w colonization of chronic indwelling catheter.   Will increase Vescicare daily dose transiently and liberalize availability of Hydrocodone/APAP for few days after Bladder injections.

## 2014-10-18 ENCOUNTER — Telehealth: Payer: Self-pay | Admitting: Family Medicine

## 2014-10-18 NOTE — Telephone Encounter (Signed)
[name] called from [facility]. She states that [name] is very concerned b/c [name] [name] has changed several meds and they have not been approved by our team.   I asked [name] not to change any meds tonight and I would ask the Geri team to take a look at the proposed changes in the am.   [name], MD [facility] Resident, PGY-3 10/18/2014, 11:21 PM

## 2014-10-19 NOTE — Telephone Encounter (Signed)
I discussed with Ms Earle GellBradham her [facility] providers recommendations for psychopharmacology changes.  She did not want to decrease Klonopin from 4 mg a day to 0.5 mg a day. She sees the Klonopin as helping with her muscle spasticity.  She was in agreement with weaning off the Sertraline 200 mg with eventual start of Fluoxetine in order to "allow her to cry."  She was in agreement with decreasing the dose of Abilify from 5 mg to one mg at bedtime and decreasing the dose of Seroquel from 100 mg to 50 mg at bedtime in order to decrease her recent weight gains.  She was in agreement with changing her Lamictal 200 mg from morning to bedtime dosing to decrease her daytime somnolence.   All the recommended psychopharmacologic changes were ordered except the reduction in her Klonopin dose and schedule.  Pt will continue on Klonopin 1 mg QID for muscle spasticity.

## 2014-10-19 NOTE — Addendum Note (Signed)
Addended byPerley Jain: [name] on: 10/19/2014 12:53 PM   Modules accepted: Orders, Medications

## 2014-10-21 ENCOUNTER — Encounter: Payer: Self-pay | Admitting: Pharmacist

## 2014-10-21 ENCOUNTER — Non-Acute Institutional Stay (INDEPENDENT_AMBULATORY_CARE_PROVIDER_SITE_OTHER): Payer: Medicaid Other | Admitting: Family Medicine

## 2014-10-21 DIAGNOSIS — G809 Cerebral palsy, unspecified: Secondary | ICD-10-CM

## 2014-10-21 DIAGNOSIS — I10 Essential (primary) hypertension: Secondary | ICD-10-CM

## 2014-10-21 DIAGNOSIS — Z9350 Unspecified cystostomy status: Secondary | ICD-10-CM

## 2014-10-21 DIAGNOSIS — G801 Spastic diplegic cerebral palsy: Secondary | ICD-10-CM

## 2014-10-21 DIAGNOSIS — F331 Major depressive disorder, recurrent, moderate: Secondary | ICD-10-CM

## 2014-10-21 DIAGNOSIS — Z9359 Other cystostomy status: Secondary | ICD-10-CM

## 2014-10-21 NOTE — Progress Notes (Signed)
Patient ID: [name] SlickerJudith [name], female   DOB: 1976-03-23, 38 y.o.   MRN: [id number]   [name] [name] FerrettiMelanie [name] [name] Whitworth, MD Phone: [phone]  Subjective:  [name] [name] is a 38 y.o F with hx of CP and neurogenic bladder who currently resides in the nursing home   Patient's main concern today is her medication changes   CP- on intrathecal baclofen pump; followed by Dr. Sharene SkeansHickling. Unfortunately needs significant amount of PT/OT that she may not be able to receive in her current housing situation   Neurogenic bladder- followed closely by urology ([facility]). Is due for an upcoming change of her cath. Denies current discomfort  Spasticity- followed by Dr. Terrace ArabiaYan. Beginning to worsen and be more of a persistent problem for her   Depression- stopped wellbutrin in end of July. Has been feeling more emotional after this but overall better. [facility] and was concerned regarding some of the medication changes; including stopping her klonopin altogether; discussed this with Dr. McDiarmid who helped to formulate regimen. Additionally pt upset after falling out with her friend   All relevant systems were reviewed and were negative unless otherwise noted in the HPI  Past Medical History Reviewed problem list.  Medications- reviewed and updated No current facility-administered medications for this visit.   No current outpatient prescriptions on file.   Facility-Administered Medications Ordered in Other Visits  Medication Dose Route Frequency Provider Last Rate Last Dose  . 0.45 % sodium chloride infusion   Intravenous Continuous Abram SanderElena M Adamo, MD 100 mL/hr at 10/23/14 0354 100 mL/hr at 10/23/14 0354  . acetaminophen (TYLENOL) tablet 650 mg  650 mg Oral Q8H Janit PaganKehinde Eniola, MD   650 mg at 10/23/14 0550  . ARIPiprazole (ABILIFY) tablet 1 mg  1 mg Oral Daily Abram SanderElena M Adamo, MD      . baclofen (LIORESAL) tablet 20 mg  20 mg Oral QID Abram SanderElena M Adamo, MD   20 mg at 10/23/14 1015  .  ceFEPIme (MAXIPIME) 1 g in dextrose 5 % 50 mL IVPB  1 g Intravenous Q12H Abram SanderElena M Adamo, MD   1 g at 10/23/14 1013  . clonazePAM (KLONOPIN) tablet 1 mg  1 mg Oral QID Abram SanderElena M Adamo, MD   1 mg at 10/23/14 1014  . diazepam (VALIUM) tablet 5 mg  5 mg Oral Q6H PRN Abram SanderElena M Adamo, MD   5 mg at 10/23/14 0549  . docusate sodium (COLACE) capsule 100 mg  100 mg Oral BID Abram SanderElena M Adamo, MD   100 mg at 10/23/14 1015  . HYDROcodone-acetaminophen (NORCO/VICODIN) 5-325 MG per tablet 1 tablet  1 tablet Oral Q6H PRN Abram SanderElena M Adamo, MD      . ibuprofen (ADVIL,MOTRIN) tablet 400 mg  400 mg Oral Q8H PRN Abram SanderElena M Adamo, MD      . Influenza vac split quadrivalent PF (FLUARIX) injection 0.5 mL  0.5 mL Intramuscular Tomorrow-1000 Janit PaganKehinde Eniola, MD      . lamoTRIgine (LAMICTAL) tablet 200 mg  200 mg Oral Daily Abram SanderElena M Adamo, MD   200 mg at 10/23/14 1014  . lidocaine (LIDODERM) 5 % 1 patch  1 patch Transdermal Q24H Janit PaganKehinde Eniola, MD   1 patch at 10/23/14 0556  . loratadine (CLARITIN) tablet 10 mg  10 mg Oral Daily Abram SanderElena M Adamo, MD   10 mg at 10/23/14 1015  . multivitamin with minerals tablet 1 tablet  1 tablet Oral Daily Janit PaganKehinde Eniola, MD   1 tablet at 10/23/14 1015  .  ondansetron (ZOFRAN) injection 4 mg  4 mg Intravenous Q6H PRN Abram SanderElena M Adamo, MD   4 mg at 10/22/14 2321  . pneumococcal 23 valent vaccine (PNU-IMMUNE) injection 0.5 mL  0.5 mL Intramuscular Tomorrow-1000 Janit PaganKehinde Eniola, MD      . polyethylene glycol (MIRALAX / GLYCOLAX) packet 17 g  17 g Oral BID Abram SanderElena M Adamo, MD   17 g at 10/23/14 1014  . polymixin-bacitracin (POLYSPORIN) ointment   Topical BID PRN Janit PaganKehinde Eniola, MD   1 application at 10/23/14 [phone]  . QUEtiapine (SEROQUEL) tablet 50 mg  50 mg Oral QHS Abram SanderElena M Adamo, MD   50 mg at 10/22/14 2129  . rivaroxaban (XARELTO) tablet 20 mg  20 mg Oral Q supper Abram SanderElena M Adamo, MD   20 mg at 10/22/14 2132  . sertraline (ZOLOFT) tablet 100 mg  100 mg Oral Daily Abram SanderElena M Adamo, MD   100 mg at 10/23/14 1014  . sodium  chloride 0.9 % injection 3 mL  3 mL Intravenous Q12H Abram SanderElena M Adamo, MD   3 mL at 10/22/14 2151  . SUMAtriptan (IMITREX) tablet 100 mg  100 mg Oral Q2H PRN Abram SanderElena M Adamo, MD      . tiZANidine (ZANAFLEX) tablet 4 mg  4 mg Oral QID Abram SanderElena M Adamo, MD   4 mg at 10/23/14 1014  . vancomycin (VANCOCIN) IVPB 750 mg/150 ml premix  750 mg Intravenous Q12H Judie BonusKimberly Ballard Hammons, RPH   750 mg at 10/23/14 [id number]    Chief complaint-noted No additions to family history Social history- patient is a never smoker  Objective: LMP 04/05/2011 BP 112/68 HR 70 RR 20 Temp 97.1 Gen: NAD, alert, sitting in wheelchair  HEENT: poor dentition CV: RRR, good S1/S2, no murmur, cap refill <3 Resp: CTABL, no wheezes, non-labored Abd: SNTND, BS present,Suprapubic cath in place no induration or purulence, urine in catheter is clear Ext: contractures noted, worsened since last visit, having difficulty raising her head; drives chair with hand  Neuro: Alert and oriented, No gross deficits, speech somewhat difficult to understand but is goal oriented and appropriate  Assessment/Plan: See problem based a/p

## 2014-10-22 ENCOUNTER — Inpatient Hospital Stay (HOSPITAL_COMMUNITY)
Admission: EM | Admit: 2014-10-22 | Discharge: 2014-10-28 | DRG: 698 | Disposition: A | Discharge status: Skilled Nursing Facility | Payer: Medicaid Other | Attending: Family Medicine | Admitting: Family Medicine

## 2014-10-22 ENCOUNTER — Emergency Department (HOSPITAL_COMMUNITY): Payer: Medicaid Other

## 2014-10-22 ENCOUNTER — Encounter (HOSPITAL_COMMUNITY): Payer: Self-pay

## 2014-10-22 DIAGNOSIS — F339 Major depressive disorder, recurrent, unspecified: Secondary | ICD-10-CM | POA: Diagnosis present

## 2014-10-22 DIAGNOSIS — R000 Tachycardia, unspecified: Secondary | ICD-10-CM | POA: Diagnosis present

## 2014-10-22 DIAGNOSIS — G809 Cerebral palsy, unspecified: Secondary | ICD-10-CM | POA: Diagnosis present

## 2014-10-22 DIAGNOSIS — Z87440 Personal history of urinary (tract) infections: Secondary | ICD-10-CM | POA: Diagnosis not present

## 2014-10-22 DIAGNOSIS — T8351XA Infection and inflammatory reaction due to indwelling urinary catheter, initial encounter: Secondary | ICD-10-CM | POA: Diagnosis present

## 2014-10-22 DIAGNOSIS — G801 Spastic diplegic cerebral palsy: Secondary | ICD-10-CM | POA: Diagnosis present

## 2014-10-22 DIAGNOSIS — K5909 Other constipation: Secondary | ICD-10-CM | POA: Diagnosis present

## 2014-10-22 DIAGNOSIS — J918 Pleural effusion in other conditions classified elsewhere: Secondary | ICD-10-CM

## 2014-10-22 DIAGNOSIS — N390 Urinary tract infection, site not specified: Secondary | ICD-10-CM | POA: Diagnosis present

## 2014-10-22 DIAGNOSIS — Z7901 Long term (current) use of anticoagulants: Secondary | ICD-10-CM | POA: Diagnosis not present

## 2014-10-22 DIAGNOSIS — Z9359 Other cystostomy status: Secondary | ICD-10-CM

## 2014-10-22 DIAGNOSIS — F42 Obsessive-compulsive disorder: Secondary | ICD-10-CM | POA: Diagnosis present

## 2014-10-22 DIAGNOSIS — R109 Unspecified abdominal pain: Secondary | ICD-10-CM | POA: Diagnosis present

## 2014-10-22 DIAGNOSIS — N319 Neuromuscular dysfunction of bladder, unspecified: Secondary | ICD-10-CM | POA: Diagnosis present

## 2014-10-22 DIAGNOSIS — J90 Pleural effusion, not elsewhere classified: Secondary | ICD-10-CM | POA: Diagnosis present

## 2014-10-22 DIAGNOSIS — L8992 Pressure ulcer of unspecified site, stage 2: Secondary | ICD-10-CM | POA: Diagnosis present

## 2014-10-22 DIAGNOSIS — F429 Obsessive-compulsive disorder, unspecified: Secondary | ICD-10-CM | POA: Diagnosis present

## 2014-10-22 DIAGNOSIS — G894 Chronic pain syndrome: Secondary | ICD-10-CM | POA: Diagnosis present

## 2014-10-22 DIAGNOSIS — R252 Cramp and spasm: Secondary | ICD-10-CM | POA: Diagnosis present

## 2014-10-22 DIAGNOSIS — R6520 Severe sepsis without septic shock: Secondary | ICD-10-CM

## 2014-10-22 DIAGNOSIS — R471 Dysarthria and anarthria: Secondary | ICD-10-CM | POA: Diagnosis present

## 2014-10-22 DIAGNOSIS — J9601 Acute respiratory failure with hypoxia: Secondary | ICD-10-CM | POA: Diagnosis present

## 2014-10-22 DIAGNOSIS — Z86711 Personal history of pulmonary embolism: Secondary | ICD-10-CM | POA: Diagnosis not present

## 2014-10-22 DIAGNOSIS — R0602 Shortness of breath: Secondary | ICD-10-CM | POA: Diagnosis present

## 2014-10-22 DIAGNOSIS — I10 Essential (primary) hypertension: Secondary | ICD-10-CM | POA: Diagnosis present

## 2014-10-22 DIAGNOSIS — A419 Sepsis, unspecified organism: Secondary | ICD-10-CM | POA: Diagnosis present

## 2014-10-22 DIAGNOSIS — IMO0001 Reserved for inherently not codable concepts without codable children: Secondary | ICD-10-CM | POA: Insufficient documentation

## 2014-10-22 DIAGNOSIS — A4189 Other specified sepsis: Secondary | ICD-10-CM | POA: Diagnosis present

## 2014-10-22 DIAGNOSIS — J189 Pneumonia, unspecified organism: Secondary | ICD-10-CM

## 2014-10-22 DIAGNOSIS — M2450 Contracture, unspecified joint: Secondary | ICD-10-CM | POA: Diagnosis present

## 2014-10-22 HISTORY — DX: Pneumonia, unspecified organism: J18.9

## 2014-10-22 HISTORY — DX: Other specified sepsis: A41.89

## 2014-10-22 LAB — GRAM STAIN

## 2014-10-22 LAB — CBC WITH DIFFERENTIAL/PLATELET
Basophils Absolute: 0 10*3/uL (ref 0.0–0.1)
Basophils Relative: 0 % (ref 0–1)
Eosinophils Absolute: 0 10*3/uL (ref 0.0–0.7)
Eosinophils Relative: 0 % (ref 0–5)
HCT: 34.7 % — ABNORMAL LOW (ref 36.0–46.0)
Hemoglobin: 10.7 g/dL — ABNORMAL LOW (ref 12.0–15.0)
LYMPHS PCT: 3 % — AB (ref 12–46)
Lymphs Abs: 0.4 10*3/uL — ABNORMAL LOW (ref 0.7–4.0)
MCH: 24.3 pg — ABNORMAL LOW (ref 26.0–34.0)
MCHC: 30.8 g/dL (ref 30.0–36.0)
MCV: 78.9 fL (ref 78.0–100.0)
MONO ABS: 1 10*3/uL (ref 0.1–1.0)
Monocytes Relative: 7 % (ref 3–12)
Neutro Abs: 12.3 10*3/uL — ABNORMAL HIGH (ref 1.7–7.7)
Neutrophils Relative %: 90 % — ABNORMAL HIGH (ref 43–77)
Platelets: 330 10*3/uL (ref 150–400)
RBC: 4.4 MIL/uL (ref 3.87–5.11)
RDW: 16.1 % — AB (ref 11.5–15.5)
WBC: 13.8 10*3/uL — AB (ref 4.0–10.5)

## 2014-10-22 LAB — I-STAT TROPONIN, ED: Troponin i, poc: 0.02 ng/mL (ref 0.00–0.08)

## 2014-10-22 LAB — BASIC METABOLIC PANEL
ANION GAP: 17 — AB (ref 5–15)
BUN: 10 mg/dL (ref 6–23)
CO2: 25 meq/L (ref 19–32)
CREATININE: 0.58 mg/dL (ref 0.50–1.10)
Calcium: 9.3 mg/dL (ref 8.4–10.5)
Chloride: 98 mEq/L (ref 96–112)
GFR calc Af Amer: >90 mL/min (ref >90)
GFR calc non Af Amer: >90 mL/min (ref >90)
Glucose, Bld: 152 mg/dL — ABNORMAL HIGH (ref 70–99)
Potassium: 4.4 mEq/L (ref 3.7–5.3)
Sodium: 140 mEq/L (ref 137–147)

## 2014-10-22 LAB — URINALYSIS, ROUTINE W REFLEX MICROSCOPIC
Bilirubin Urine: NEGATIVE
GLUCOSE, UA: NEGATIVE mg/dL
KETONES UR: NEGATIVE mg/dL
LEUKOCYTES UA: NEGATIVE
Nitrite: NEGATIVE
PH: 6.5 (ref 5.0–8.0)
PROTEIN: NEGATIVE mg/dL
Specific Gravity, Urine: 1.001 — ABNORMAL LOW (ref 1.005–1.030)
Urobilinogen, UA: 0.2 mg/dL (ref 0.0–1.0)

## 2014-10-22 LAB — URINE MICROSCOPIC-ADD ON

## 2014-10-22 LAB — SURGICAL PCR SCREEN
MRSA, PCR: NEGATIVE
Staphylococcus aureus: NEGATIVE

## 2014-10-22 LAB — PRO B NATRIURETIC PEPTIDE: Pro B Natriuretic peptide (BNP): 321.7 pg/mL — ABNORMAL HIGH (ref 0–125)

## 2014-10-22 LAB — I-STAT CG4 LACTIC ACID, ED: Lactic Acid, Venous: 3.33 mmol/L — ABNORMAL HIGH (ref 0.5–2.2)

## 2014-10-22 MED ORDER — DOUBLE ANTIBIOTIC 500-10000 UNIT/GM EX OINT
TOPICAL_OINTMENT | Freq: Two times a day (BID) | CUTANEOUS | Status: DC | PRN
  Administered 2014-10-22: 22:00:00 via TOPICAL
  Administered 2014-10-23: 1 via TOPICAL
  Filled 2014-10-22 (×3): qty 1

## 2014-10-22 MED ORDER — HYDROCODONE-ACETAMINOPHEN 5-325 MG PO TABS
1.0000 | ORAL_TABLET | Freq: Four times a day (QID) | ORAL | Status: DC | PRN
Start: 2014-10-22 — End: 2014-10-28
  Administered 2014-10-25 – 2014-10-26 (×2): 1 via ORAL
  Filled 2014-10-22 (×2): qty 1

## 2014-10-22 MED ORDER — DEXTROSE 5 % IV SOLN
1.0000 g | Freq: Two times a day (BID) | INTRAVENOUS | Status: DC
  Administered 2014-10-22 – 2014-10-26 (×8): 1 g via INTRAVENOUS
  Filled 2014-10-22 (×10): qty 1

## 2014-10-22 MED ORDER — ACETIC ACID 2 % OT SOLN: 4.0000 [drp] | OTIC | Status: DC

## 2014-10-22 MED ORDER — PNEUMOCOCCAL VAC POLYVALENT 25 MCG/0.5ML IJ INJ
0.5000 mL | INJECTION | INTRAMUSCULAR | Status: AC
  Administered 2014-10-24: 0.5 mL via INTRAMUSCULAR
  Filled 2014-10-22: qty 0.5

## 2014-10-22 MED ORDER — SERTRALINE HCL 100 MG PO TABS
100.0000 mg | ORAL_TABLET | Freq: Every day | ORAL | Status: AC
  Administered 2014-10-23 – 2014-10-27 (×5): 100 mg via ORAL
  Filled 2014-10-22 (×5): qty 1

## 2014-10-22 MED ORDER — DIAZEPAM 5 MG PO TABS
5.0000 mg | ORAL_TABLET | Freq: Four times a day (QID) | ORAL | Status: DC | PRN
  Administered 2014-10-22 – 2014-10-25 (×4): 5 mg via ORAL
  Filled 2014-10-22 (×5): qty 1

## 2014-10-22 MED ORDER — SODIUM CHLORIDE 0.9 % IV BOLUS (SEPSIS)
1000.0000 mL | INTRAVENOUS | Status: AC
  Administered 2014-10-22 (×2): 1000 mL via INTRAVENOUS

## 2014-10-22 MED ORDER — LIDOCAINE 5 % EX PTCH: 1.0000 | MEDICATED_PATCH | CUTANEOUS | Status: DC

## 2014-10-22 MED ORDER — QUETIAPINE FUMARATE 50 MG PO TABS
50.0000 mg | ORAL_TABLET | Freq: Every day | ORAL | Status: DC
  Administered 2014-10-22 – 2014-10-27 (×6): 50 mg via ORAL
  Filled 2014-10-22 (×7): qty 1

## 2014-10-22 MED ORDER — POLYETHYLENE GLYCOL 3350 17 G PO PACK
17.0000 g | PACK | Freq: Two times a day (BID) | ORAL | Status: DC
  Administered 2014-10-23 – 2014-10-28 (×11): 17 g via ORAL
  Filled 2014-10-22 (×13): qty 1

## 2014-10-22 MED ORDER — IBUPROFEN 400 MG PO TABS
400.0000 mg | ORAL_TABLET | Freq: Three times a day (TID) | ORAL | Status: DC | PRN
  Administered 2014-10-24: 400 mg via ORAL
  Filled 2014-10-22 (×2): qty 1

## 2014-10-22 MED ORDER — INFLUENZA VAC SPLIT QUAD 0.5 ML IM SUSY
0.5000 mL | PREFILLED_SYRINGE | INTRAMUSCULAR | Status: AC
  Administered 2014-10-24: 0.5 mL via INTRAMUSCULAR
  Filled 2014-10-22: qty 0.5

## 2014-10-22 MED ORDER — SODIUM CHLORIDE 0.9 % IJ SOLN
3.0000 mL | Freq: Two times a day (BID) | INTRAMUSCULAR | Status: DC
  Administered 2014-10-22 – 2014-10-28 (×5): 3 mL via INTRAVENOUS

## 2014-10-22 MED ORDER — ONDANSETRON HCL 4 MG/2ML IJ SOLN
4.0000 mg | Freq: Four times a day (QID) | INTRAMUSCULAR | Status: DC | PRN
  Administered 2014-10-22 – 2014-10-26 (×2): 4 mg via INTRAVENOUS
  Filled 2014-10-22 (×2): qty 2

## 2014-10-22 MED ORDER — LAMOTRIGINE 200 MG PO TABS
200.0000 mg | ORAL_TABLET | Freq: Every day | ORAL | Status: DC
  Administered 2014-10-23 – 2014-10-28 (×6): 200 mg via ORAL
  Filled 2014-10-22 (×6): qty 1

## 2014-10-22 MED ORDER — LIDOCAINE 5 % EX PTCH
1.0000 | MEDICATED_PATCH | CUTANEOUS | Status: DC
  Administered 2014-10-23 – 2014-10-28 (×6): 1 via TRANSDERMAL
  Filled 2014-10-22 (×8): qty 1

## 2014-10-22 MED ORDER — VANCOMYCIN HCL IN DEXTROSE 750-5 MG/150ML-% IV SOLN
750.0000 mg | Freq: Two times a day (BID) | INTRAVENOUS | Status: DC
  Administered 2014-10-22 – 2014-10-26 (×8): 750 mg via INTRAVENOUS
  Filled 2014-10-22 (×9): qty 150

## 2014-10-22 MED ORDER — ACETAMINOPHEN 325 MG PO TABS
650.0000 mg | ORAL_TABLET | Freq: Three times a day (TID) | ORAL | Status: DC
  Administered 2014-10-22 – 2014-10-28 (×17): 650 mg via ORAL
  Filled 2014-10-22 (×18): qty 2

## 2014-10-22 MED ORDER — ADULT MULTIVITAMIN W/MINERALS CH
1.0000 | ORAL_TABLET | Freq: Every day | ORAL | Status: DC
  Administered 2014-10-23 – 2014-10-28 (×6): 1 via ORAL
  Filled 2014-10-22 (×6): qty 1

## 2014-10-22 MED ORDER — ACETIC ACID-ALUMINUM ACETATE 2 % OT SOLN
4.0000 [drp] | OTIC | Status: DC
  Filled 2014-10-22: qty 60

## 2014-10-22 MED ORDER — LORATADINE 10 MG PO TABS
10.0000 mg | ORAL_TABLET | Freq: Every day | ORAL | Status: DC
  Administered 2014-10-23 – 2014-10-27 (×5): 10 mg via ORAL
  Filled 2014-10-22 (×6): qty 1

## 2014-10-22 MED ORDER — DEXTROSE 5 % IV SOLN: 1.0000 g | Freq: Two times a day (BID) | INTRAVENOUS | Status: DC

## 2014-10-22 MED ORDER — ACETAMINOPHEN 325 MG PO TABS: 650.0000 mg | ORAL_TABLET | Freq: Three times a day (TID) | ORAL | Status: DC

## 2014-10-22 MED ORDER — ACETAMINOPHEN 325 MG PO TABS
650.0000 mg | ORAL_TABLET | Freq: Once | ORAL | Status: AC
  Administered 2014-10-22: 650 mg via ORAL
  Filled 2014-10-22: qty 2

## 2014-10-22 MED ORDER — VANCOMYCIN HCL IN DEXTROSE 1-5 GM/200ML-% IV SOLN: 1000.0000 mg | Freq: Once | INTRAVENOUS | Status: DC

## 2014-10-22 MED ORDER — SUMATRIPTAN SUCCINATE 100 MG PO TABS
100.0000 mg | ORAL_TABLET | ORAL | Status: DC | PRN
  Administered 2014-10-26 – 2014-10-27 (×2): 100 mg via ORAL
  Filled 2014-10-22 (×5): qty 1

## 2014-10-22 MED ORDER — SODIUM CHLORIDE 0.45 % IV SOLN
INTRAVENOUS | Status: DC
  Administered 2014-10-22: 100 mL/h via INTRAVENOUS
  Administered 2014-10-23: 15:00:00 via INTRAVENOUS
  Administered 2014-10-23: 100 mL/h via INTRAVENOUS
  Administered 2014-10-24 – 2014-10-27 (×5): via INTRAVENOUS

## 2014-10-22 MED ORDER — IBUPROFEN 400 MG PO TABS
400.0000 mg | ORAL_TABLET | Freq: Three times a day (TID) | ORAL | Status: DC | PRN
  Administered 2014-10-22: 400 mg via ORAL
  Filled 2014-10-22 (×2): qty 1

## 2014-10-22 MED ORDER — TIZANIDINE HCL 4 MG PO TABS
4.0000 mg | ORAL_TABLET | Freq: Four times a day (QID) | ORAL | Status: DC
  Administered 2014-10-22 – 2014-10-28 (×21): 4 mg via ORAL
  Filled 2014-10-22 (×26): qty 1

## 2014-10-22 MED ORDER — ONE-DAILY MULTI VITAMINS PO TABS: 1.0000 | ORAL_TABLET | Freq: Every day | ORAL | Status: DC

## 2014-10-22 MED ORDER — METRONIDAZOLE IN NACL 5-0.79 MG/ML-% IV SOLN
500.0000 mg | Freq: Once | INTRAVENOUS | Status: DC
  Filled 2014-10-22: qty 100

## 2014-10-22 MED ORDER — ARIPIPRAZOLE 2 MG PO TABS
1.0000 mg | ORAL_TABLET | Freq: Every day | ORAL | Status: DC
  Administered 2014-10-23 – 2014-10-28 (×6): 1 mg via ORAL
  Filled 2014-10-22 (×8): qty 1

## 2014-10-22 MED ORDER — DOCUSATE SODIUM 100 MG PO CAPS
100.0000 mg | ORAL_CAPSULE | Freq: Two times a day (BID) | ORAL | Status: DC
  Administered 2014-10-22 – 2014-10-28 (×12): 100 mg via ORAL
  Filled 2014-10-22 (×15): qty 1

## 2014-10-22 MED ORDER — CLONAZEPAM 1 MG PO TABS
1.0000 mg | ORAL_TABLET | Freq: Four times a day (QID) | ORAL | Status: DC
  Administered 2014-10-22 – 2014-10-28 (×21): 1 mg via ORAL
  Filled 2014-10-22 (×22): qty 1

## 2014-10-22 MED ORDER — RIVAROXABAN 20 MG PO TABS
20.0000 mg | ORAL_TABLET | Freq: Every day | ORAL | Status: DC
  Administered 2014-10-22 – 2014-10-27 (×6): 20 mg via ORAL
  Filled 2014-10-22 (×7): qty 1

## 2014-10-22 MED ORDER — METRONIDAZOLE IN NACL 5-0.79 MG/ML-% IV SOLN
500.0000 mg | Freq: Once | INTRAVENOUS | Status: AC
  Administered 2014-10-22: 500 mg via INTRAVENOUS

## 2014-10-22 MED ORDER — BACLOFEN 20 MG PO TABS
20.0000 mg | ORAL_TABLET | Freq: Four times a day (QID) | ORAL | Status: DC
  Administered 2014-10-22 – 2014-10-28 (×21): 20 mg via ORAL
  Filled 2014-10-22 (×26): qty 1

## 2014-10-22 MED ORDER — BACITRACIN-POLYMYXIN B 500-10000 UNIT/GM EX OINT: 1.0000 "application " | TOPICAL_OINTMENT | Freq: Two times a day (BID) | CUTANEOUS | Status: DC | PRN

## 2014-10-22 NOTE — Progress Notes (Signed)
Admitted to room 5w14 from ED, father at bedside, stg 2 on lower sacrum pink foam applied, instructed on room surroundings, denies nausea, c/o and pain

## 2014-10-22 NOTE — ED Provider Notes (Signed)
CSN: [id number]     Arrival date & time 10/22/14  1345 History   First MD Initiated Contact with Patient 10/22/14 1356     Chief Complaint  Patient presents with  . Fever   [name] [name] is a 38 y.o. female NH resident with PMH CP with spasticity, PE on xarelto presenting with dyspnea, new oxygen requirement.   [facility] for shortness of breath requiring oxygen by nasal cannula. She is febrile upon arrival to 103. She reports shortness of breath and subjective fever/chills. Denies coughing, leg swelling, trauma, chest pain, palpitations. Has suprapubic catheter and history of UTI.   She has been receiving all medications per NH including xarelto.   (Consider location/radiation/quality/duration/timing/severity/associated sxs/prior Treatment) HPI  Past Medical History  Diagnosis Date  . Cerebral palsy   . Pulmonary embolism     Lifetime Coumadin  . Cerebral palsy     Spastic Cerebral palsy, mentally intact  . Contracture, joint, multiple sites     Electric wheelchair, uses left hand to operate chair.   [name] Kitchen. Hypertension   . OCD (obsessive compulsive disorder)   . Depression   . Migraine   . [name]   . Dysarthria   . Endometriosis   . History of recurrent UTIs   . GERD (gastroesophageal reflux disease)   . Pulmonary embolism 2011, 01/2011    Will be on lifetime coumadin  . Anemia   . Abdominal pain 01/02/2012    Overview:  Overview:  Per Previous pcp note- Dr. Ta--Pt has history of endometriosis and had DUB in 2011.  She She had u/s that showed normal endometrial stripe.  She had endometrial bx that was wnl.  She has a lot of abd cramping every month.  This cramping was sometimes not relieved with hydrocodone.  [facility] for IUD placement to help endometriosis and abd cramping.  [name]  . Macroscopic hematuria 03/10/2013    status post replacement of suprapubic tube 03/04/2013   . HYPERTENSION, BENIGN  01/31/2010    Qualifier: Diagnosis of  By: Gala RomneyBensimhon, MD, Trixie DredgeFACC, Daniel R   . History of endometriosis 10/2012    [facility]GYN [facility]: Laparoscopic Endometrial Ablation, [facility],   Past Surgical History  Procedure Laterality Date  . Colposcopy  06/2000  . Cesarean section      x 2  . Tubal ligation  2003  . Carpal tunnel release  08/2008    Dr Teressa SenterSypher  . Wrist surgery  06/2010    Dr Dierdre SearlesLi[facility]  . Baclofen pump refill      x 3 times  . Appendectomy    . Intrauterine device insertion  04/30/11    [facility]  . Laparoscopically assisted vag hysterectomy  10/27/2012  . Multiple extractions with alveoloplasty Bilateral 07/19/2013    Procedure: EXTRACTIONS #4, 4,09,81,195,11,16,19;  Surgeon: Francene Findershristopher L , DDS;  [facility];  Service: Oral Surgery;  Laterality: Bilateral;  . Pain pump implantation N/A 03/07/2014    Procedure: baclofen pump revision/replacement and Catheter connection replacement;  Surgeon: Cristi LoronJeffrey D Jenkins, MD;  Location: [facility];  Service: Neurosurgery;  Laterality: N/A;  baclofen pump revision/replacement and Catheter connection replacement  . Programable baclofen pump revision  03/17/14    Battery Replacement    Family History  Problem Relation Age of Onset  . Asthma Father   . Colon cancer Maternal Grandmother     Died in her 5960's  .  Cancer Paternal Grandmother   . Breast cancer Paternal Grandmother     Died in her 42's  . Heart attack Maternal Grandfather     Died in his 6's  . Alzheimer's disease Paternal Grandfather     Died in his 62's   History  Substance Use Topics  . Smoking status: Never Smoker   . Smokeless tobacco: Never Used  . Alcohol Use: 0.6 oz/week    1 Glasses of wine per week     Comment: Drinks alcohol once a month.   OB History    No data available     Review of Systems  Constitutional: Positive for fever, chills and diaphoresis.  HENT: Negative for congestion, ear discharge, ear pain, sinus  pressure and sore throat.   Eyes: Negative for visual disturbance.  Respiratory: Positive for shortness of breath. Negative for cough, chest tightness and wheezing.   Cardiovascular: Negative for chest pain and leg swelling.  Gastrointestinal: Negative for nausea, vomiting, abdominal pain, diarrhea, constipation and abdominal distention.  Genitourinary: Positive for hematuria. Negative for dysuria and flank pain.  Musculoskeletal: Negative for neck pain and neck stiffness.  Skin: Negative for rash.      Allergies  Morphine  Home Medications   Prior to Admission medications   Medication Sig Start Date End Date Taking? Authorizing Provider  acetaminophen (TYLENOL) 325 MG tablet Take 650 mg by mouth every 8 (eight) hours.     Historical Provider, MD  acetic acid (VOSOL) 2 % otic solution Place 4 drops into both ears once a week.    Historical Provider, MD  ARIPiprazole (ABILIFY) 2 MG tablet Take 1 mg by mouth at bedtime.    Historical Provider, MD  bacitracin-polymyxin b (POLYSPORIN) ointment Apply 1 application topically 2 (two) times daily as needed (Redness around cath site).    Historical Provider, MD  baclofen (LIORESAL) 10 MG tablet Take 20 mg by mouth 3 (three) times daily.    Historical Provider, MD  baclofen (LIORESAL) 20 MG tablet Take 20 mg by mouth 4 (four) times daily.    Historical Provider, MD  cetirizine (ZYRTEC) 5 MG tablet Take 5 mg by mouth at bedtime.    Historical Provider, MD  clonazePAM (KLONOPIN) 1 MG tablet Take 1 tablet (1 mg total) by mouth 4 (four) times daily. 08/01/14   Leighton Roach McDiarmid, MD  diazepam (VALIUM) 5 MG tablet Take 5 mg by mouth every 6 (six) hours as needed for muscle spasms.    Historical Provider, MD  docusate sodium (COLACE) 100 MG capsule Take 100 mg by mouth 2 (two) times daily.    Historical Provider, MD  FLUoxetine (PROZAC) 20 MG tablet Take 20 mg by mouth daily. Start about 11/17/14.    Historical Provider, MD  HYDROcodone-acetaminophen  (NORCO/VICODIN) 5-325 MG per tablet Take 1 tablet by mouth every 6 (six) hours as needed (pain in both knees).    Historical Provider, MD  ibuprofen (ADVIL,MOTRIN) 400 MG tablet Take 400 mg by mouth every 8 (eight) hours as needed for mild pain.    Historical Provider, MD  lamoTRIgine (LAMICTAL) 100 MG tablet Take 200 mg by mouth at bedtime.    Historical Provider, MD  lidocaine (LIDODERM) 5 % Place 1 patch onto the skin daily. Remove & Discard patch within 12 hours or as directed by MD    Historical Provider, MD  Multiple Vitamin (MULTIVITAMIN) tablet Take 1 tablet by mouth daily.    Historical Provider, MD  polyethylene glycol (MIRALAX / GLYCOLAX) packet  Take 17 g by mouth 2 (two) times daily.     Historical Provider, MD  QUEtiapine (SEROQUEL) 50 MG tablet Take 50 mg by mouth at bedtime.    Historical Provider, MD  rivaroxaban (XARELTO) 20 MG TABS tablet Take 20 mg by mouth daily with supper.    Historical Provider, MD  sertraline (ZOLOFT) 50 MG tablet Take 50 mg by mouth daily. Start 10/20/14: 2 tab daily x 7 day, then 1 tab daily x 7 days, then 1/2 tab daily x 14 days, then stop Zoloft, start Fluoxetine 20 mg daily.    Historical Provider, MD  SUMAtriptan (IMITREX) 100 MG tablet Take 100 mg by mouth every 2 (two) hours as needed for migraine or headache. May repeat in 2 hours if headache persists or recurs.    Historical Provider, MD  tiZANidine (ZANAFLEX) 4 MG tablet Take 4 mg by mouth 4 (four) times daily.  05/12/14   Levert FeinsteinYijun Yan, MD   BP 127/109 mmHg  Pulse 155  Temp(Src) 103.6 F (39.8 C) (Oral)  Resp 18  SpO2 94%  LMP 04/05/2011 Physical Exam  Constitutional: She is oriented to person, place, and time. She appears well-developed and well-nourished. No distress.  HENT:  Mouth/Throat: Oropharynx is clear and moist. No oropharyngeal exudate.  Eyes: EOM are normal. Pupils are equal, round, and reactive to light. No scleral icterus.  Neck: Neck supple. No JVD present.  Cardiovascular:  Regular rhythm, normal heart sounds and intact distal pulses.   No murmur heard. tachycardic  Pulmonary/Chest: She has no wheezes. She has no rales. She exhibits no tenderness.  Tachypneic without accessory muscle use. No focal consolidation.   Abdominal: Soft. Bowel sounds are normal. She exhibits no distension. There is no tenderness.  Genitourinary:  suprapubic catheter without obvious drainage.  Musculoskeletal: Normal range of motion. She exhibits tenderness (upper and lower leg bilaterally with negative Homan's). She exhibits no edema.  Lymphadenopathy:    She has no cervical adenopathy.  Neurological: She is alert and oriented to person, place, and time. She exhibits abnormal muscle tone (spastic paresis x 4 quadrants).  Skin: Skin is warm.  diaphoretic  Nursing note and vitals reviewed.   ED Course  Procedures (including critical care time) Labs Review Labs Reviewed  PRO B NATRIURETIC PEPTIDE  CBC WITH DIFFERENTIAL  BASIC METABOLIC PANEL  I-STAT TROPOININ, ED    Imaging Review No results found.   EKG Interpretation None      MDM   Final diagnoses:  Shortness of breath   38 y.o. female NH patients with 4/4 SIRS criteria and shortness of breath. Lactate elevated, HR responsive to fluids consistent with sepsis due to unclear source (most likely urinary given chronic suprapubic catheterization and absence of skin findings; possibly pulmonary given primary sxs but unremarkable CXR). Weaning oxygen as tolerated, no signs of impending decompensation. Will call for admission to [facility].   Tyrone Nineyan B Kym Scannell, MD 10/22/14 1528  Derwood KaplanAnkit Nanavati, MD 10/23/14 [id number]  Derwood KaplanAnkit Nanavati, MD 10/23/14 [phone]

## 2014-10-22 NOTE — Progress Notes (Signed)
Received report from GroverLindsey in ED

## 2014-10-22 NOTE — Progress Notes (Signed)
Family medicine notified of arrival of unit

## 2014-10-22 NOTE — ED Notes (Signed)
Istat Lactic Acid reported to Dr.Nanavati. ED-Lab

## 2014-10-22 NOTE — Progress Notes (Signed)
ANTIBIOTIC CONSULT NOTE - INITIAL  Pharmacy Consult for Vancomycin and Cefepime Indication: rule out sepsis, possible urinary source  Allergies  Allergen Reactions  . Morphine Dermatitis    Skin turned red    Patient Measurements: Height: 4\' 10"  (147.3 cm) Weight: 135 lb 14.4 oz (61.644 kg) IBW/kg (Calculated) : 40.9  Vital Signs: Temp: 99.4 F (37.4 C) (11/21 1708) Temp Source: Oral (11/21 1708) BP: 109/70 mmHg (11/21 1708) Pulse Rate: 105 (11/21 1708) Intake/Output from previous day:   Intake/Output from this shift: Total I/O In: -  Out: 550 [Urine:550]  Labs:  Recent Labs  10/22/14 1405  WBC 13.8*  HGB 10.7*  PLT 330  CREATININE 0.58   Estimated Creatinine Clearance: 72.6 mL/min (by C-G formula based on Cr of 0.58).    Microbiology: Recent Results (from the past 720 hour(s))  Urine culture     Status: None   Collection Time: 10/04/14 12:13 AM  Result Value Ref Range Status   Specimen Description URINE, CATHETERIZED  Final   Special Requests NONE  Final   Culture  Setup Time   Final    10/04/2014 10:07 Performed at [facility]    Colony Count   Final    >=100,000 COLONIES/ML Performed at [facility]    Culture   Final    Multiple bacterial morphotypes present, none predominant. Suggest appropriate recollection if clinically indicated. Performed at [facility]    Report Status 10/05/2014 FINAL  Final  Gram stain     Status: None   Collection Time: 10/22/14  5:45 PM  Result Value Ref Range Status   Specimen Description URINE, SUPRAPUBIC  Final   Special Requests NONE  Final   Gram Stain   Final    CYTOSPIN SLIDE NO WBC SEEN Multiple bacterial morphotypes present, none predominant. Suggest appropriate recollection if clinically indicated.    Report Status 10/22/2014 FINAL  Final    Medical History: Past Medical History  Diagnosis Date  . Cerebral palsy   . Pulmonary embolism     Lifetime Coumadin  . Cerebral  palsy     Spastic Cerebral palsy, mentally intact  . Contracture, joint, multiple sites     Electric wheelchair, uses left hand to operate chair.   [name] Kitchen. Hypertension   . OCD (obsessive compulsive disorder)   . Depression   . Migraine   . [name]   . Dysarthria   . Endometriosis   . History of recurrent UTIs   . GERD (gastroesophageal reflux disease)   . Pulmonary embolism 2011, 01/2011    Will be on lifetime coumadin  . Anemia   . Abdominal pain 01/02/2012    Overview:  Overview:  Per Previous pcp note- Dr. Ta--Pt has history of endometriosis and had DUB in 2011.  She She had u/s that showed normal endometrial stripe.  She had endometrial bx that was wnl.  She has a lot of abd cramping every month.  This cramping was sometimes not relieved with hydrocodone.  [facility] for IUD placement to help endometriosis and abd cramping.  [name]  . Macroscopic hematuria 03/10/2013    status post replacement of suprapubic tube 03/04/2013   . HYPERTENSION, BENIGN 01/31/2010    Qualifier: Diagnosis of  By: Gala RomneyBensimhon, MD, Trixie DredgeFACC, Daniel R   . History of endometriosis 10/2012    OBGYN [facility]: Laparoscopic Endometrial Ablation, TVH,    Medications:  Prescriptions prior to admission  Medication Sig Dispense Refill Last  Dose  . acetaminophen (TYLENOL) 325 MG tablet Take 650 mg by mouth every 8 (eight) hours.    10/22/2014 at Unknown time  . acetic acid (VOSOL) 2 % otic solution Place 4 drops into both ears once a week.   Past Week at Unknown time  . ARIPIPRAZOLE PO Take 1 mg by mouth daily.   10/21/2014 at Unknown time  . baclofen (LIORESAL) 10 MG tablet Take 20 mg by mouth 4 (four) times daily.    10/22/2014 at Unknown time  . cetirizine (ZYRTEC) 10 MG tablet Take 5 mg by mouth daily.   10/22/2014 at Unknown time  . clonazePAM (KLONOPIN) 1 MG tablet Take 1 tablet (1 mg total) by mouth 4 (four) times daily. 120 tablet 3 10/21/2014 at Unknown time  . docusate sodium (COLACE)  100 MG capsule Take 100 mg by mouth 2 (two) times daily.   10/21/2014 at Unknown time  . HYDROcodone-acetaminophen (NORCO/VICODIN) 5-325 MG per tablet Take 1 tablet by mouth every 6 (six) hours as needed (pain in both knees).   10/22/2014 at Unknown time  . ibuprofen (ADVIL,MOTRIN) 400 MG tablet Take 400 mg by mouth every 8 (eight) hours as needed for mild pain.   Past Month at Unknown time  . lamoTRIgine (LAMICTAL) 200 MG tablet Take 200 mg by mouth daily.   10/21/2014 at Unknown time  . lidocaine (LIDODERM) 5 % Place 1 patch onto the skin daily. Remove & Discard patch within 12 hours or as directed by MD   10/22/2014 at Unknown time  . Multiple Vitamin (MULTIVITAMIN) tablet Take 1 tablet by mouth daily.   10/22/2014 at Unknown time  . polyethylene glycol (MIRALAX / GLYCOLAX) packet Take 17 g by mouth 2 (two) times daily.    10/22/2014 at Unknown time  . QUEtiapine (SEROQUEL) 50 MG tablet Take 50 mg by mouth at bedtime.   10/21/2014 at Unknown time  . rivaroxaban (XARELTO) 20 MG TABS tablet Take 20 mg by mouth daily with supper.   10/21/2014 at Unknown time  . sertraline (ZOLOFT) 50 MG tablet Take 50 mg by mouth daily. Start 10/20/14: 2 tab daily x 7 day, then 1 tab daily x 7 days, then 1/2 tab daily x 14 days, then stop Zoloft, start Fluoxetine 20 mg daily.   10/22/2014 at Unknown time  . tiZANidine (ZANAFLEX) 4 MG tablet Take 4 mg by mouth 4 (four) times daily.    10/22/2014 at Unknown time  . bacitracin-polymyxin b (POLYSPORIN) ointment Apply 1 application topically 2 (two) times daily as needed (Redness around cath site).   Unk  . diazepam (VALIUM) 5 MG tablet Take 5 mg by mouth every 6 (six) hours as needed for muscle spasms.   Unk  . SUMAtriptan (IMITREX) 100 MG tablet Take 100 mg by mouth every 2 (two) hours as needed for migraine or headache. May repeat in 2 hours if headache persists or recurs.   Unk   Assessment: 38 yo F presented to ED with bladder spasms, SB, and chills.  Tm 104,  tachycardic, WBC 13.8, LA 3.3.  CXR neg for PNA.  Pt has PMH significant for spastic cerebral palsy, chronic UTI with neurogenic bladder and suprapubic cath, and hx PE on Xarelto.  [facility]  Pt received the following antibiotics today in the ED:  Flagyl 500mg  IV x 1 at 1615 Cefepime and Vanc ordered but not given pending collection of urine cx  Goal of Therapy:  Vancomycin trough level  10-15 mcg/ml Renal dose adjustment of antibiotics  Plan:  Vancomycin 750mg  IV q12h Cefepime 1gm q12h (ordered by MD) - dose ok Follow up renal function, culture data, and clinical progress  [employer], Pharm.D., BCPS Clinical Pharmacist Pager [phone] 10/22/2014 8:29 PM

## 2014-10-22 NOTE — ED Notes (Signed)
Informed 5W RN that patients foley is clamped to attempt to collect urine sample.

## 2014-10-22 NOTE — H&P (Signed)
Family Medicine Teaching [facility] Admission History and Physical Service Pager: [phone]  Patient name: [name] [name] Medical record number: [id number] Date of birth: March 10, 1976 Age: 38 y.o. Gender: female  Primary Care Provider: Anselm Lis, MD Consultants: none Code Status: full  Chief Complaint: bladder spasm  Assessment and Plan: [name] [name] is a 38 y.o. female presenting with bladder spasm, chills and SOB. PMH is significant for spastic cerebral palsy, chronic UTI with neurogenic bladder and suprapubic catheter, and h/o PE on xarelto.  # Sepsis: Likely urinary source given history. UA and UCx pending. Lactate 3.33. Febrile to 104. Tachycardic to 155. WBC 13.8. Also possible pneumonia given SOB and O2 desat at nursing home but CXR clear. - s/p 1L NS in ED with tachycardia resolved - s/p metronidazole in ED, will not continue at this time - continue MIVF, rebolus as needed - cefepime for likely cath-associated UTI - f/u UA, urine gram stain and culture - f/u blood cultures - low threshold to add vanc for any decompensation - wean O2 as tolerated - monitor on tele, continuous pulse ox  # SOB: likely related to urosepsis but pneumonia vs. PE also possible. On xarelto for past PE. - wean O2 as tolerated - monitor on tele, continuous pulse ox - low threshold to CTA if respiratory status not improving  # CP - continue home antispasmodics and pain meds - continue home lamictal  # Depression/Anxiety/OCD - continue home klonopin, valium, abilify, seroquel - continue home zoloft taper, 100mg  for 5 more days, discuss with pcp/geri team   FEN/GI: soft diet, MIVF Prophylaxis: on xarelto  Disposition: admit to tele floor, fmts, Dr. Lum Babe attending  History of Present Illness: [name] [name] is a 38 y.o. female presenting with SOB and oxygen desaturation at nursing home. Per patient, was having shaking, bladder spasm and shortness of breath. Per nursing home her O2  saturation was down to 81% on room air so the put her on [place] and called EMS. In the ED she was found to be febrile to 104 and tachycardic. She was given 1L NS and had a negative CXR. Denies coughing, leg swelling, trauma, chest pain, palpitations  Review Of Systems: Per HPI  Otherwise 12 point review of systems was performed and was unremarkable.  Patient Active Problem List   Diagnosis Date Noted  . Sepsis 10/22/2014  . Sepsis due to urinary tract infection 10/22/2014  . Constipation, chronic 10/10/2014  . Athetoid cerebral palsy 04/13/2014  . Dystonia 04/12/2014  . Constipation 03/16/2014  . Presence of intrathecal baclofen pump 03/07/2014  . Spasticity 01/05/2014  . Nursing home resident 09/16/2013  . Dental caries 07/18/2013  . Multiple thyroid nodules 05/20/2013  . Osteoarthrosis 04/07/2013  . Anemia 04/07/2013  . [name]   . Dysarthria   . GERD (gastroesophageal reflux disease)   . Gross hematuria 03/12/2013  . Neurogenic bladder 02/05/2013  . Anxiety state 10/21/2012  . Healed or old pulmonary embolism 10/21/2012  . History of anticoagulant therapy 10/21/2012  . Chronic pain syndrome 06/17/2012  . Abdominal pain 01/02/2012  . Intracervical pessary 05/07/2011  . Recurrent pulmonary embolism 05/03/2011  . Chronic suprapubic catheter 03/15/2011  . Dyspnea and respiratory abnormality 02/04/2011  . Seborrheic keratosis 01/18/2011  . Long term (current) use of anticoagulants 01/12/2011  . Disease of female genital organs 10/24/2010  . HYPERTENSION, BENIGN 01/31/2010  . CP (cerebral palsy), spastic 12/27/2008  . Contracted, joint, multiple sites 12/27/2008  . DEPRESSION, MAJOR, RECURRENT 01/29/2007  . OBSESSIVE COMPUL.  DISORDER 01/29/2007  . Infantile cerebral palsy 01/29/2007  . Anancastic neurosis 01/29/2007   Past Medical History: Past Medical History  Diagnosis Date  . Cerebral palsy   . Pulmonary embolism     Lifetime Coumadin  .  Cerebral palsy     Spastic Cerebral palsy, mentally intact  . Contracture, joint, multiple sites     Electric wheelchair, uses left hand to operate chair.   [name] Kitchen. Hypertension   . OCD (obsessive compulsive disorder)   . Depression   . Migraine   . [name]   . Dysarthria   . Endometriosis   . History of recurrent UTIs   . GERD (gastroesophageal reflux disease)   . Pulmonary embolism 2011, 01/2011    Will be on lifetime coumadin  . Anemia   . Abdominal pain 01/02/2012    Overview:  Overview:  Per Previous pcp note- Dr. Ta--Pt has history of endometriosis and had DUB in 2011.  She She had u/s that showed normal endometrial stripe.  She had endometrial bx that was wnl.  She has a lot of abd cramping every month.  This cramping was sometimes not relieved with hydrocodone.  [facility]UD [facility] to help endometriosis and abd cramping.  [name]  . Macroscopic hematuria 03/10/2013    status post replacement of suprapubic tube 03/04/2013   . HYPERTENSION, BENIGN 01/31/2010    Qualifier: Diagnosis of  By: Gala RomneyBensimhon, MD, Trixie DredgeFACC, Daniel R   . History of endometriosis 10/2012    OBGYN [facility]: Laparoscopic Endometrial Ablation, [facility],   Past Surgical History: Past Surgical History  Procedure Laterality Date  . Colposcopy  06/2000  . Cesarean section      x 2  . Tubal ligation  2003  . Carpal tunnel release  08/2008    Dr Teressa SenterSypher  . Wrist surgery  06/2010    Dr Dierdre SearlesLi[facility]  . Baclofen pump refill      x 3 times  . Appendectomy    . Intrauterine device insertion  04/30/11    [facility]  . Laparoscopically assisted vag hysterectomy  10/27/2012  . Multiple extractions with alveoloplasty Bilateral 07/19/2013    Procedure: EXTRACTIONS #4, 1,61,09,605,11,16,19;  Surgeon: Francene Findershristopher L Leakey, DDS;  [facility];  Service: Oral Surgery;  Laterality: Bilateral;  . Pain pump implantation N/A 03/07/2014    Procedure: baclofen pump  revision/replacement and Catheter connection replacement;  Surgeon: Cristi LoronJeffrey D Jenkins, MD;  Location: [facility] ORS;  Service: Neurosurgery;  Laterality: N/A;  baclofen pump revision/replacement and Catheter connection replacement  . Programable baclofen pump revision  03/17/14    Battery Replacement    Social History: History  Substance Use Topics  . Smoking status: Never Smoker   . Smokeless tobacco: Never Used  . Alcohol Use: 0.6 oz/week    1 Glasses of wine per week     Comment: Drinks alcohol once a month.   Please also refer to relevant sections of EMR.  Family History: Family History  Problem Relation Age of Onset  . Asthma Father   . Colon cancer Maternal Grandmother     Died in her 3960's  . Cancer Paternal Grandmother   . Breast cancer Paternal Grandmother     Died in her 3170's  . Heart attack Maternal Grandfather     Died in his 6070's  . Alzheimer's disease Paternal Grandfather     Died in his 3880's  Allergies and Medications: Allergies  Allergen Reactions  . Morphine Dermatitis    Skin turned red   No current facility-administered medications on file prior to encounter.   Current Outpatient Prescriptions on File Prior to Encounter  Medication Sig Dispense Refill  . acetaminophen (TYLENOL) 325 MG tablet Take 650 mg by mouth every 8 (eight) hours.     [name] Kitchen acetic acid (VOSOL) 2 % otic solution Place 4 drops into both ears once a week.    . baclofen (LIORESAL) 10 MG tablet Take 20 mg by mouth 4 (four) times daily.     . clonazePAM (KLONOPIN) 1 MG tablet Take 1 tablet (1 mg total) by mouth 4 (four) times daily. 120 tablet 3  . docusate sodium (COLACE) 100 MG capsule Take 100 mg by mouth 2 (two) times daily.    [name] Kitchen HYDROcodone-acetaminophen (NORCO/VICODIN) 5-325 MG per tablet Take 1 tablet by mouth every 6 (six) hours as needed (pain in both knees).    [name] Kitchen ibuprofen (ADVIL,MOTRIN) 400 MG tablet Take 400 mg by mouth every 8 (eight) hours as needed for mild pain.    [name] Kitchen lidocaine  (LIDODERM) 5 % Place 1 patch onto the skin daily. Remove & Discard patch within 12 hours or as directed by MD    . Multiple Vitamin (MULTIVITAMIN) tablet Take 1 tablet by mouth daily.    . polyethylene glycol (MIRALAX / GLYCOLAX) packet Take 17 g by mouth 2 (two) times daily.     . QUEtiapine (SEROQUEL) 50 MG tablet Take 50 mg by mouth at bedtime.    . rivaroxaban (XARELTO) 20 MG TABS tablet Take 20 mg by mouth daily with supper.    . sertraline (ZOLOFT) 50 MG tablet Take 50 mg by mouth daily. Start 10/20/14: 2 tab daily x 7 day, then 1 tab daily x 7 days, then 1/2 tab daily x 14 days, then stop Zoloft, start Fluoxetine 20 mg daily.    [name] Kitchen tiZANidine (ZANAFLEX) 4 MG tablet Take 4 mg by mouth 4 (four) times daily.     . bacitracin-polymyxin b (POLYSPORIN) ointment Apply 1 application topically 2 (two) times daily as needed (Redness around cath site).    . diazepam (VALIUM) 5 MG tablet Take 5 mg by mouth every 6 (six) hours as needed for muscle spasms.    . SUMAtriptan (IMITREX) 100 MG tablet Take 100 mg by mouth every 2 (two) hours as needed for migraine or headache. May repeat in 2 hours if headache persists or recurs.      Objective: BP 91/50 mmHg  Pulse 88  Temp(Src) 99 F (37.2 C) (Oral)  Resp 16  Wt 132 lb (59.875 kg)  SpO2 98%  LMP 04/05/2011 Exam: General: lying in bed, NAD HEENT: MMM, NCAT Cardiovascular: RRR, no MRG Respiratory: CTAB, normal WOB on 6L [place] Abdomen: soft, NTND Extremities: WWP, spastic with multiple contractures Skin: diaphoretic, no rash Neuro: alert and oriented, at her baseline dysarthria and abnormal tone throughout  Labs and Imaging: CBC BMET   Recent Labs Lab 10/22/14 1405  WBC 13.8*  HGB 10.7*  HCT 34.7*  PLT 330    Recent Labs Lab 10/22/14 1405  NA 140  K 4.4  CL 98  CO2 25  BUN 10  CREATININE 0.58  GLUCOSE 152*  CALCIUM 9.3     ProBNP 321.7 Trop 0.02 Lactic acid 3.33  Dg Chest Port 1 View  10/22/2014   CLINICAL DATA:   38 year old female with hypoxia and shortness of breath.  EXAM: PORTABLE CHEST -  1 VIEW  COMPARISON:  03/10/2014 and prior chest radiographs  FINDINGS: This is a very low volume film with mild bibasilar atelectasis.  The cardiomediastinal silhouette is unchanged.  There is no evidence of focal airspace disease, pulmonary edema, suspicious pulmonary nodule/mass, pleural effusion, or pneumothorax. No acute bony abnormalities are identified.  IMPRESSION: Low volume film with mild bibasilar atelectasis. No other significant abnormalities identified.   Electronically Signed   By: Laveda AbbeJeff  Hu M.D.   On: 10/22/2014 14:47    Abram SanderElena M Chancy Claros, MD 10/22/2014, 4:09 PM PGY-2, [place] [facility] FPTS Intern pager: [phone], text pages welcome

## 2014-10-22 NOTE — Progress Notes (Signed)
Md on call notified that pt is vomiting and fever and need to recollect UA sample. New orders received and will continue to monitor. Ilean SkillVeronica Ben Sanz LPN

## 2014-10-22 NOTE — ED Notes (Signed)
Pt to department via EMS [facility]- staff reports that the patients O2 sats were low at the facility. Pt had a HR- 150's with EMS and placed on oxygen with an increase in O2 sats. Pt with indwelling foley, and hx of PE. Bp-110/78 HR-160

## 2014-10-23 DIAGNOSIS — N319 Neuromuscular dysfunction of bladder, unspecified: Secondary | ICD-10-CM

## 2014-10-23 DIAGNOSIS — J9601 Acute respiratory failure with hypoxia: Secondary | ICD-10-CM | POA: Diagnosis present

## 2014-10-23 DIAGNOSIS — N390 Urinary tract infection, site not specified: Secondary | ICD-10-CM

## 2014-10-23 DIAGNOSIS — A419 Sepsis, unspecified organism: Secondary | ICD-10-CM

## 2014-10-23 LAB — BASIC METABOLIC PANEL
Anion gap: 13 (ref 5–15)
BUN: 10 mg/dL (ref 6–23)
CO2: 21 meq/L (ref 19–32)
Calcium: 7.9 mg/dL — ABNORMAL LOW (ref 8.4–10.5)
Chloride: 107 mEq/L (ref 96–112)
Creatinine, Ser: 0.48 mg/dL — ABNORMAL LOW (ref 0.50–1.10)
GFR calc Af Amer: >90 mL/min (ref >90)
GFR calc non Af Amer: >90 mL/min (ref >90)
GLUCOSE: 131 mg/dL — AB (ref 70–99)
Potassium: 3.3 mEq/L — ABNORMAL LOW (ref 3.7–5.3)
SODIUM: 141 meq/L (ref 137–147)

## 2014-10-23 LAB — CBC
HCT: 28.8 % — ABNORMAL LOW (ref 36.0–46.0)
Hemoglobin: 9 g/dL — ABNORMAL LOW (ref 12.0–15.0)
MCH: 25.4 pg — ABNORMAL LOW (ref 26.0–34.0)
MCHC: 31.3 g/dL (ref 30.0–36.0)
MCV: 81.1 fL (ref 78.0–100.0)
PLATELETS: 252 10*3/uL (ref 150–400)
RBC: 3.55 MIL/uL — ABNORMAL LOW (ref 3.87–5.11)
RDW: 16.7 % — ABNORMAL HIGH (ref 11.5–15.5)
WBC: 12.5 10*3/uL — ABNORMAL HIGH (ref 4.0–10.5)

## 2014-10-23 LAB — URINE MICROSCOPIC-ADD ON

## 2014-10-23 LAB — URINALYSIS, ROUTINE W REFLEX MICROSCOPIC
Bilirubin Urine: NEGATIVE
GLUCOSE, UA: NEGATIVE mg/dL
KETONES UR: NEGATIVE mg/dL
Nitrite: POSITIVE — AB
PH: 5.5 (ref 5.0–8.0)
PROTEIN: 100 mg/dL — AB
Specific Gravity, Urine: 1.012 (ref 1.005–1.030)
Urobilinogen, UA: 0.2 mg/dL (ref 0.0–1.0)

## 2014-10-23 MED ORDER — POTASSIUM CHLORIDE 10 MEQ/100ML IV SOLN
10.0000 meq | INTRAVENOUS | Status: DC
  Filled 2014-10-23 (×4): qty 100

## 2014-10-23 MED ORDER — POTASSIUM CHLORIDE CRYS ER 20 MEQ PO TBCR
40.0000 meq | EXTENDED_RELEASE_TABLET | Freq: Once | ORAL | Status: AC
  Administered 2014-10-23: 40 meq via ORAL
  Filled 2014-10-23: qty 2

## 2014-10-23 NOTE — Assessment & Plan Note (Signed)
BP again stable without medication  Will cont to monitor for htn or hypotensive episodes

## 2014-10-23 NOTE — Progress Notes (Signed)
Md on call notified that UA has resulted and that temp 102.4 no new orders will continue to monitor. Ilean SkillVeronica Audreanna Torrisi LPN

## 2014-10-23 NOTE — Progress Notes (Signed)
Md on call notified the supra pubic cath was out new order to place foley given pt cath site was cleansed and neosporin placed on site and covered with clean gauge.Ilean SkillVeronica Freddie Nghiem LPN

## 2014-10-23 NOTE — Plan of Care (Signed)
Problem: Phase I Progression Outcomes Goal: Hemodynamically stable Outcome: Not Progressing     

## 2014-10-23 NOTE — Progress Notes (Signed)
Utilization Review Completed.[name] [name] T11/22/2015  

## 2014-10-23 NOTE — Assessment & Plan Note (Signed)
New medication regimen decided on including abilify 1mg  seroquel 50mg  zoloft taper to start 25mg  prozac lamictal switched to night Cont current klonopin Attempt to give pt as much autonomy as possible

## 2014-10-23 NOTE — Progress Notes (Signed)
Family Medicine Teaching Service Daily Progress Note Intern Pager: [phone]  Patient name: [name] SlickerJudith Repka Medical record number: [id number] Date of birth: Dec 21, 1975 Age: 38 y.o. Gender: female  Primary Care Provider: Anselm LisMarsh, Melanie, MD Consultants: None Code Status: Full   Assessment and Plan: [name] [name] is a 38 y.o. female presenting with bladder spasm, chills and SOB. PMH is significant for spastic cerebral palsy, chronic UTI with neurogenic bladder and suprapubic catheter, and h/o PE on xarelto.  # Sepsis: Likely urinary source given history.  - Initial UA concerning for spurious result, repeat with concern for UTI and culture repeated off of that one - Febrile to 102.4 overnight - continue MIVF, rebolus as needed - vanc and cefepime (day 1 and 2 resp), consider double coverage for pseudomonas if continues to be febrile - f/u blood cultures - wean O2 as tolerated - monitor on tele, continuous pulse ox  Neurogenic bladder - with chronic suprapubic cath and bladder spasms - cath out, foley via urethra now in.  - replace today  # SOB: likely related to urosepsis but pneumonia vs. PE also possible. On xarelto for past PE. Wells score 3.0 - wean O2 as tolerated - monitor on tele, continuous pulse ox - low threshold to CTA if respiratory status not improving  # CP - continue home antispasmodics and pain meds - continue home lamictal  # Depression/Anxiety/OCD - continue home klonopin, valium, abilify, seroquel - continue home zoloft taper, 100mg  for 5 more days, discuss with pcp/geri team  FEN/GI: soft diet, MIVF Prophylaxis: on xarelto  Disposition: Continue tele for IV antibiotics   Subjective:  Feels slightly better, still feeling dyspneic and having chest pain with deep inspiration.   Objective: Temp:  [98.9 F (37.2 C)-104 F (40 C)] 98.9 F (37.2 C) (11/22 0529) Pulse Rate:  [81-155] 96 (11/22 0529) Resp:  [12-28] 18 (11/22 0529) BP: (91-127)/(49-109)  97/64 mmHg (11/22 0529) SpO2:  [94 %-100 %] 100 % (11/22 0529) Weight:  [132 lb (59.875 kg)-135 lb 14.4 oz (61.644 kg)] 135 lb 14.4 oz (61.644 kg) (11/21 1708) Physical Exam: Gen: lying in bed, spastic, NAD HEENT: MMM, Drooling CV: RRR, good S1/S2, no murmur Resp: CTABL, non labored, on 3 L via [place] Abd: SNTND, BS present, no guarding or organomegaly Ext: No edema, spastic with multiple contractures unchanged from her previous exams Neuro: Alert and oriented, with baseline dysarthria and increased tone.    Laboratory:  Recent Labs Lab 10/22/14 1405 10/23/14 0547  WBC 13.8* 12.5*  HGB 10.7* 9.0*  HCT 34.7* 28.8*  PLT 330 252    Recent Labs Lab 10/22/14 1405 10/23/14 0547  NA 140 141  K 4.4 3.3*  CL 98 107  CO2 25 21  BUN 10 10  CREATININE 0.58 0.48*  CALCIUM 9.3 7.9*  GLUCOSE 152* 131*    ProBNP 321.7 Trop 0.02 Lactic acid 3.33  Imaging/Diagnostic Tests: CXR 10/22/2014 IMPRESSION: Low volume film with mild bibasilar atelectasis. No other significant abnormalities identified.   Elenora GammaSamuel L Bradshaw, MD 10/23/2014, 10:20 AM PGY-3, [place] [facility] FPTS Intern pager: [phone], text pages welcome

## 2014-10-23 NOTE — Progress Notes (Signed)
Placed 18 Fr suprapubic catheter in the usual sterile fashion. Balloon inflated without incident and the patient tolerated the procedure well.   Had to replace as it fell out last night. Urethral foley catheter was dc'd after suprapubic catheter was placed.   Murtis SinkSam Bradshaw, MD [facility] Family Medicine Resident, PGY-3 10/23/2014, 2:11 PM

## 2014-10-23 NOTE — Assessment & Plan Note (Signed)
Since initiation of this note, have received word pt hospitalized with Sepsis (likely 2/2 urinary source) Cath will need to be changed during hospitalization (suspect it has already) Will f.up outpt for pain Cont vesicare

## 2014-10-23 NOTE — Plan of Care (Signed)
Problem: Phase I Progression Outcomes Goal: Pain controlled with appropriate interventions Outcome: Progressing     

## 2014-10-23 NOTE — Progress Notes (Signed)
Dr. Ermalinda MemosBradshaw notified of alert lab results  concerning blood cultures

## 2014-10-23 NOTE — Plan of Care (Signed)
Problem: Phase I Progression Outcomes Goal: Pain controlled with appropriate interventions Outcome: Completed/Met Date Met:  10/23/14     

## 2014-10-23 NOTE — Assessment & Plan Note (Signed)
Pt not as focused on today Will attempt to maximize services at [facility] Cont baclofen and tizandine   Will discuss with geri team need for neuro PT as outpt?

## 2014-10-24 ENCOUNTER — Encounter (HOSPITAL_COMMUNITY): Payer: Self-pay | Admitting: Family Medicine

## 2014-10-24 DIAGNOSIS — L8992 Pressure ulcer of unspecified site, stage 2: Secondary | ICD-10-CM | POA: Diagnosis present

## 2014-10-24 DIAGNOSIS — I369 Nonrheumatic tricuspid valve disorder, unspecified: Secondary | ICD-10-CM

## 2014-10-24 DIAGNOSIS — R258 Other abnormal involuntary movements: Secondary | ICD-10-CM

## 2014-10-24 DIAGNOSIS — R471 Dysarthria and anarthria: Secondary | ICD-10-CM

## 2014-10-24 DIAGNOSIS — G809 Cerebral palsy, unspecified: Secondary | ICD-10-CM

## 2014-10-24 DIAGNOSIS — K5900 Constipation, unspecified: Secondary | ICD-10-CM

## 2014-10-24 DIAGNOSIS — Z9350 Unspecified cystostomy status: Secondary | ICD-10-CM

## 2014-10-24 DIAGNOSIS — A4189 Other specified sepsis: Secondary | ICD-10-CM | POA: Diagnosis present

## 2014-10-24 DIAGNOSIS — R1084 Generalized abdominal pain: Secondary | ICD-10-CM

## 2014-10-24 HISTORY — DX: Other specified sepsis: A41.89

## 2014-10-24 MED ORDER — SENNA 8.6 MG PO TABS
2.0000 | ORAL_TABLET | Freq: Every day | ORAL | Status: DC
  Administered 2014-10-24 – 2014-10-25 (×2): 17.2 mg via ORAL
  Filled 2014-10-24 (×2): qty 2

## 2014-10-24 MED ORDER — DARIFENACIN HYDROBROMIDE ER 15 MG PO TB24
15.0000 mg | ORAL_TABLET | Freq: Every day | ORAL | Status: DC
  Administered 2014-10-24 – 2014-10-28 (×5): 15 mg via ORAL
  Filled 2014-10-24 (×6): qty 1

## 2014-10-24 NOTE — Progress Notes (Signed)
Pts temp 102.8 rectally. Motrin given, on call provider notified.

## 2014-10-24 NOTE — Progress Notes (Signed)
Family Medicine Teaching Service Daily Progress Note Intern Pager: [phone]  Patient name: [name] SlickerJudith Odonnel Medical record number: [id number] Date of birth: Dec 02, 1976 Age: 38 y.o. Gender: female  Primary Care Provider: Anselm LisMarsh, Melanie, MD Consultants: None Code Status: Full  Assessment and Plan: [name] [name] is a 38 y.o. female presenting with bladder spasm, chills and SOB. PMH is significant for spastic cerebral palsy, chronic UTI with neurogenic bladder and suprapubic catheter, and h/o PE on xarelto.  # Sepsis: Likely urinary source given history.  - WBC 12.5, Tachypneic, Hypotensive (100/59 this am), febrile (last fever at 3:30am at 101.6; fever of 103 at ~2am) - Lactic Acid 3.33 - UA- turbid, + nitrites, large leukocytes, large hemoglobin, many bacteria, calcium oxalate crystals - Urine Gram Stain- multiple bacteria morphotypes, none predominant - Blood Cx- gram + cocci in clusters - Continue MIVF, rebolus as needed - vanc and cefepime (day 1 and 2 resp), consider double coverage for pseudomonas if continues to be febrile - Vancomycin (11/22>>) and Cefepime (11/21>>) - Wean O2 as tolerated - Follow echocardiogram  # Neurogenic Bladder: with chronic suprapubic cath and bladder spasms - Foley replaced on 11/22  # SOB: likely related to urosepsis but pneumonia vs. PE also possible. On xarelto for past PE. Wells score 3.0 - Wean O2 as tolerated - Monitor on tele, continuous pulse ox - Low threshold to CTA if respiratory status not improving  # Cerebral Palsy - Continue home antispasmodics and pain meds - Continue home lamictal  FEN/GI: soft diet, MIVF Prophylaxis: on xarelto  Disposition: Continue tele for IV antibiotics   Subjective:  No acute complaints overnight. States she still feels unwell, however she is much improved since yesterday. Complains of chills. No further complaints.  Objective: Temp:  [98.2 F (36.8 C)-103 F (39.4 C)] 98.2 F (36.8 C) (11/23  0604) Pulse Rate:  [78-88] 78 (11/23 0530) Resp:  [18-26] 22 (11/23 0530) BP: (89-108)/(57-68) 100/59 mmHg (11/23 0530) SpO2:  [98 %-100 %] 100 % (11/23 0530) Physical Exam: Gen: 38yo female resting comfortably and being fed breakfast, in no apparent distress. Note history of Cerebral Palsy. HEENT: MMM, Drooling CV: S1 and S2 noted. No murmur noted. Regular rate and rhythm. Resp: Clear to auscultation bilaterally. No wheezes noted. No increased work of breathing. On 3L [place] Abd: BS present, soft and non-distended, no tenderness to palpation Ext: No edema, spastic with multiple contractures unchanged from her previous exams Neuro: Alert and oriented, with baseline dysarthria and increased tone.   Laboratory:  Recent Labs Lab 10/22/14 1405 10/23/14 0547  WBC 13.8* 12.5*  HGB 10.7* 9.0*  HCT 34.7* 28.8*  PLT 330 252    Recent Labs Lab 10/22/14 1405 10/23/14 0547  NA 140 141  K 4.4 3.3*  CL 98 107  CO2 25 21  BUN 10 10  CREATININE 0.58 0.48*  CALCIUM 9.3 7.9*  GLUCOSE 152* 131*   ProBNP 321.7 Trop 0.02 Lactic acid 3.33  Imaging/Diagnostic Tests: CXR 10/22/2014 IMPRESSION: Low volume film with mild bibasilar atelectasis. No other significant abnormalities identified.   [address] Santa Teresa[place]umley, [place]DO 10/24/2014, 8:36 AM PGY-1, [place] [facility] FPTS Intern pager: [phone], text pages welcome

## 2014-10-24 NOTE — Progress Notes (Signed)
Nutrition Brief Note  Patient identified on the Malnutrition Screening Tool (MST) Report  Wt Readings from Last 15 Encounters:  10/22/14 135 lb 14.4 oz (61.644 kg)  08/22/14 132 lb (59.875 kg)  08/16/14 132 lb (59.875 kg)  05/03/14 123 lb 11.2 oz (56.11 kg)  03/10/14 123 lb 7.3 oz (56 kg)  03/07/14 115 lb 1.3 oz (52.2 kg)  07/15/13 115 lb (52.164 kg)  06/25/13 106 lb (48.081 kg)  06/07/13 106 lb (48.081 kg)  03/10/13 121 lb (54.885 kg)  09/23/11 117 lb (53.071 kg)  06/17/11 125 lb 6.4 oz (56.881 kg)  05/15/11 128 lb (58.06 kg)  05/10/11 127 lb 9.6 oz (57.879 kg)  04/11/11 134 lb 12.8 oz (61.145 kg)   [name] [name] is a 38 y.o. female presenting with bladder spasm, chills and SOB. PMH is significant for spastic cerebral palsy, chronic UTI with neurogenic bladder and suprapubic catheter, and h/o PE on xarelto.  Pt was very somnolent at time of visit; would not respond to name being called. Noted hx of wt gain over the past year. Meal intake has been fair. Abbreviated nutrition focused physical exam reveals no signs of fat and muscle depletion.   Body mass index is 28.41 kg/(m^2). Patient meets criteria for overweight based on current BMI.   Current diet order is regular, patient is consuming approximately 40% of meals at this time. Labs and medications reviewed.   No nutrition interventions warranted at this time. If nutrition issues arise, please consult RD.   Liba Hulsey A. Mayford KnifeWilliams, RD, LDN Pager: [phone] After hours Pager: [phone]

## 2014-10-24 NOTE — Clinical Social Work Note (Signed)
CSW attempted to assess patient at bedside. CSW unable to get patient to wake up to engage in assessment. CSW will try to assess again at a later time. CSW attempted to call numbers listed in chart, message left with father, and other number would not allow me to leave a message. CS[facility] has [facility] [place] [facility]. CSW will continue to follow.   Roddie McBryant Faiz Weber MSW, [place], [place], [phone]

## 2014-10-24 NOTE — Plan of Care (Signed)
Problem: Phase I Progression Outcomes Goal: Initial discharge plan identified Outcome: Progressing     

## 2014-10-24 NOTE — Clinical Documentation Improvement (Signed)
Query #1   "Hypoxic Respiratory Failure" documented by ED physician.  02 sats reportedly in the 80's at skilled nursing facility prior to admission.  Requiring 02 3-6 liters by nasal cannula.  Respiratory rate in the 40's on admission.  If you agree with the ED physician's documentation, please document the applicable diagnosis in the progress notes and discharge summary, including the ACUITY of the hypoxic respiratory failure:   - Acute Hypoxicc Respiratory Failure   - Other Condition   - Unable to Clinically Determine  Query #2  "Stage 2 Sacral Pressure Ulcer" assessed and documented by nursing staff on admission.  Please document if the you agree with the nursing documentation, including whether or not POA, or provide an alternate assessment and stage if you disagree.   - Stage 2 Sacral Decubitus Ulcer, Present on Admission   - Other Condition   - Unable to Clinically Determine  Thank You, Jerral Ralphathy R Elad Macphail ,RN Clinical Documentation Specialist:  [phone] [facility]- Health Information Management

## 2014-10-24 NOTE — Care Management Note (Unsigned)
    Page 1 of 1   10/24/2014     11:59:34 AM CARE MANAGEMENT NOTE 10/24/2014  Patient:  [name] [name],[name] [name]   Account Number:  [id number]  Date Initiated:  10/24/2014  Documentation initiated by:  Letha CapeAYLOR,Chizara Mena  Subjective/Objective Assessment:   dx sepsis  admit- from [facility]     Action/Plan:   Anticipated DC Date:  10/25/2014   Anticipated DC Plan:  SKILLED NURSING FACILITY  In-house referral  Clinical Social Worker      DC Planning Services  CM consult      Choice offered to / List presented to:             Status of service:  In process, will continue to follow Medicare Important Message given?  NO (If response is "NO", the following Medicare IM given date fields will be blank) Date Medicare IM given:   Medicare IM given by:   Date Additional Medicare IM given:   Additional Medicare IM given by:    Discharge Disposition:    Per UR Regulation:    If discussed at Long Length of Stay Meetings, dates discussed:    Comments:  10/24/14 1158 Letha Capeeborah Roxanne Orner RN BSN [phone] [id number] [facility], CSW referral.

## 2014-10-24 NOTE — Progress Notes (Signed)
Pts temp after the motrin is 101.6. Dr. Ermalinda MemosBradshaw notified. No new orders given

## 2014-10-24 NOTE — Progress Notes (Signed)
  Echocardiogram 2D Echocardiogram has been performed.  Cathie BeamsGREGORY, Gracee Ratterree 10/24/2014, 12:10 PM

## 2014-10-25 ENCOUNTER — Encounter (HOSPITAL_COMMUNITY): Payer: Self-pay | Admitting: Family Medicine

## 2014-10-25 ENCOUNTER — Inpatient Hospital Stay (HOSPITAL_COMMUNITY): Payer: Medicaid Other

## 2014-10-25 DIAGNOSIS — J918 Pleural effusion in other conditions classified elsewhere: Secondary | ICD-10-CM

## 2014-10-25 DIAGNOSIS — J189 Pneumonia, unspecified organism: Secondary | ICD-10-CM

## 2014-10-25 DIAGNOSIS — M2450 Contracture, unspecified joint: Secondary | ICD-10-CM

## 2014-10-25 DIAGNOSIS — J948 Other specified pleural conditions: Secondary | ICD-10-CM

## 2014-10-25 HISTORY — DX: Pleural effusion in other conditions classified elsewhere: J91.8

## 2014-10-25 HISTORY — DX: Pneumonia, unspecified organism: J18.9

## 2014-10-25 LAB — URINE CULTURE: Colony Count: >=100000

## 2014-10-25 LAB — CBC
HEMATOCRIT: 30.7 % — AB (ref 36.0–46.0)
HEMOGLOBIN: 9.6 g/dL — AB (ref 12.0–15.0)
MCH: 24.9 pg — ABNORMAL LOW (ref 26.0–34.0)
MCHC: 31.3 g/dL (ref 30.0–36.0)
MCV: 79.5 fL (ref 78.0–100.0)
Platelets: 197 10*3/uL (ref 150–400)
RBC: 3.86 MIL/uL — ABNORMAL LOW (ref 3.87–5.11)
RDW: 17 % — ABNORMAL HIGH (ref 11.5–15.5)
WBC: 5.6 10*3/uL (ref 4.0–10.5)

## 2014-10-25 LAB — BASIC METABOLIC PANEL
Anion gap: 13 (ref 5–15)
BUN: 6 mg/dL (ref 6–23)
CHLORIDE: 101 meq/L (ref 96–112)
CO2: 24 mEq/L (ref 19–32)
Calcium: 8.4 mg/dL (ref 8.4–10.5)
Creatinine, Ser: 0.41 mg/dL — ABNORMAL LOW (ref 0.50–1.10)
GFR calc Af Amer: >90 mL/min (ref >90)
GFR calc non Af Amer: >90 mL/min (ref >90)
GLUCOSE: 90 mg/dL (ref 70–99)
Potassium: 3.9 mEq/L (ref 3.7–5.3)
Sodium: 138 mEq/L (ref 137–147)

## 2014-10-25 LAB — CULTURE, BLOOD (ROUTINE X 2)

## 2014-10-25 LAB — STREP PNEUMONIAE URINARY ANTIGEN: STREP PNEUMO URINARY ANTIGEN: NEGATIVE

## 2014-10-25 MED ORDER — LEVOFLOXACIN 750 MG PO TABS
750.0000 mg | ORAL_TABLET | Freq: Every day | ORAL | Status: DC
  Administered 2014-10-25 – 2014-10-28 (×4): 750 mg via ORAL
  Filled 2014-10-25 (×4): qty 1

## 2014-10-25 MED ORDER — IOHEXOL 350 MG/ML SOLN
80.0000 mL | Freq: Once | INTRAVENOUS | Status: AC | PRN
  Administered 2014-10-25: 80 mL via INTRAVENOUS

## 2014-10-25 MED ORDER — DICLOFENAC SODIUM 50 MG PO TBEC
50.0000 mg | DELAYED_RELEASE_TABLET | Freq: Once | ORAL | Status: AC
  Administered 2014-10-25: 50 mg via ORAL
  Filled 2014-10-25: qty 1

## 2014-10-25 MED ORDER — SENNA 8.6 MG PO TABS
2.0000 | ORAL_TABLET | Freq: Two times a day (BID) | ORAL | Status: DC
  Administered 2014-10-25 – 2014-10-28 (×6): 17.2 mg via ORAL
  Filled 2014-10-25 (×7): qty 2

## 2014-10-25 NOTE — Progress Notes (Signed)
Family Medicine Teaching Service Daily Progress Note Intern Pager: [phone]  Patient name: [name] [name] Medical record number: [id number] Date of birth: 1976/03/01 Age: 38 y.o. Gender: female  Primary Care Provider: Anselm Lis, MD Consultants: None Code Status: Full  Assessment and Plan: [name] [name] is a 38 y.o. female presenting with bladder spasm, chills and SOB. PMH is significant for spastic cerebral palsy, chronic UTI with neurogenic bladder and suprapubic catheter, and h/o PE on xarelto.  # Sepsis: Secondary to HCAP +/- Urinary Source. WBC 12.5, Tachypneic, Hypotensive (100/59 this am), febrile at admission - WBC 5.6 on 11/24 - Febrile at 100.8 this morning - Lactic Acid 3.33 - UA- turbid, + nitrites, large leukocytes, large hemoglobin, many bacteria, calcium oxalate crystals - Urine Gram Stain- multiple bacteria morphotypes, none predominant - Blood Cx- gram + cocci in clusters - Urine Cx- gram - rods - Continue MIVF, rebolus as needed - vanc and cefepime (day 1 and 2 resp), consider double coverage for pseudomonas if continues to be febrile - Vancomycin (11/22>>) and Cefepime (11/21>>) - Will add Levaquin today due to diagnosis of HCAP (11/24>>) - Wean O2 as tolerated. Currently on 3L [place] and satting 97% - Follow echocardiogram--EF 65%, no vegetations - CTA: no PE noted. Bilateral lower lobe infiltrate left >right. Small pleural effusions bilaterally.  # Parapneumonic Effusion- small - Thoracentesis not indicated at this time - Continue to monitor   # Neurogenic Bladder: with chronic suprapubic cath and bladder spasms - Foley replaced on 11/22. Consider placement of larger size  # SOB: likely related to urosepsis but pneumonia vs. PE also possible. On xarelto for past PE. Wells score 6.0 - Wean O2 as tolerated. Currently on 3L [place]. - Monitor on tele, continuous pulse ox - Follow up CTA  # Contipation- no bowel movement since Friday - Colace 100mg  BID, Miralax  17g, Sennakot 17.2mg  BID - Soap Suds Enema today  # Headache- Diclofenac 50mg   # Cerebral Palsy - Continue home antispasmodics and pain meds - Continue home lamictal  FEN/GI: soft diet, MIVF Prophylaxis: on xarelto  Disposition: Continue tele for IV antibiotics   Subjective:  No acute complaints overnight. Feeling more short of breath this morning. Remains constipated, stating last bowel movement was on Friday. Would like a soap suds enema today. Also complains of headache. States Tylenol and Aleve normally does not work for her, however Diclofenac works well. Is anxious to get home for Thanksgiving. Denies pain except for headache. No further complaints today.  Objective: Temp:  [98.1 F (36.7 C)-100.8 F (38.2 C)] 100.8 F (38.2 C) (11/24 1478) Pulse Rate:  [58-87] 87 (11/24 0632) Resp:  [18-22] 22 (11/24 0632) BP: (90-131)/(62-80) 110/70 mmHg (11/24 0632) SpO2:  [97 %-100 %] 97 % (11/24 2956) Physical Exam: Gen: 38yo female resting comfortably and being fed breakfast, in no apparent distress. Note history of Cerebral Palsy. HEENT: MMM, Drooling CV: S1 and S2 noted. No murmur noted. Regular rate and rhythm. Resp: Clear to auscultation bilaterally. No wheezes noted. No increased work of breathing. On 3L [place] Abd: BS present, soft and non-distended, no tenderness to palpation Ext: No edema, spastic with multiple contractures unchanged from her previous exams Neuro: Alert and oriented, with baseline dysarthria and increased tone.   Laboratory:  Recent Labs Lab 10/22/14 1405 10/23/14 0547 10/25/14 0545  WBC 13.8* 12.5* 5.6  HGB 10.7* 9.0* 9.6*  HCT 34.7* 28.8* 30.7*  PLT 330 252 197    Recent Labs Lab 10/22/14 1405 10/23/14 0547 10/25/14 0545  NA 140 141 138  K 4.4 3.3* 3.9  CL 98 107 101  CO2 25 21 24   BUN 10 10 6   CREATININE 0.58 0.48* 0.41*  CALCIUM 9.3 7.9* 8.4  GLUCOSE 152* 131* 90   ProBNP 321.7 Trop 0.02 Lactic acid 3.33  Imaging/Diagnostic  Tests Ct Angio Chest Pe W/cm &/or Wo Cm  10/25/2014   CLINICAL DATA:  Shortness of Breath ; history of previous pulmonary emboli  EXAM: CT ANGIOGRAPHY CHEST WITH CONTRAST  TECHNIQUE: Multidetector CT imaging of the chest was performed using the standard protocol during bolus administration of intravenous contrast. Multiplanar CT image reconstructions and MIPs were obtained to evaluate the vascular anatomy.  CONTRAST:  80mL OMNIPAQUE IOHEXOL 350 MG/ML SOLN  COMPARISON:  Chest radiograph October 22, 2014 and chest CT July 17, 2011  FINDINGS: There is no demonstrable pulmonary embolus. There is no thoracic aortic aneurysm or dissection.  There are bilateral pleural effusions with bibasilar lung consolidation, more on the left than on the right.  There is no appreciable thoracic adenopathy. The pericardium is not thickened. Heart is borderline enlarged.  In the visualized upper abdomen, there are multiple gallstones within the gallbladder.  There is incomplete visualization of a catheter within the thoracic region. The distal aspect of this catheter is at the level of T9. Its more proximal aspect is not visualized on this study.  There is enlargement of the thyroid with inhomogeneous attenuation, particularly on the left.  Review of the MIP images confirms the above findings.  IMPRESSION: No demonstrable pulmonary embolus.  Bilateral lower lobe infiltrate, more on the left than on the right. Small pleural effusions bilaterally.  Heart borderline enlarged.  Cholelithiasis.  Enlarged inhomogeneous thyroid. This finding may warrant thyroid ultrasound to further assess.   Electronically Signed   By: Bretta BangWilliam  Woodruff M.D.   On: 10/25/2014 10:02   Ct Abdomen Pelvis W Contrast  10/04/2014   CLINICAL DATA:  Initial evaluation for acute abdominal pain.  EXAM: CT ABDOMEN AND PELVIS WITH CONTRAST  TECHNIQUE: Multidetector CT imaging of the abdomen and pelvis was performed using the standard protocol following bolus  administration of intravenous contrast.  CONTRAST:  100mL OMNIPAQUE IOHEXOL 300 MG/ML  SOLN  COMPARISON:  Prior study from earlier same day.  FINDINGS: Mild bibasilar atelectasis seen dependently within the lung bases, right greater than left. There is a trace right pleural effusion.  Liver demonstrates a normal contrast enhanced appearance. Subtle heterogeneous hyperdensity within the gallbladder lumen may reflect stones and/ or sludge. Common bile duct mildly dilated up to 7.8 mm. Delete that  Spleen, adrenal glands, and pancreas demonstrate a normal contrast enhanced appearance.  Simple 1.7 cm hypodense lesion within the left kidney measures intermediate density at 35 Hounsfield units, indeterminate. No nephrolithiasis or hydronephrosis.  Stomach within normal limits. No evidence of bowel obstruction. Moderate to large amount of retained stool seen within the distal colon, suggesting constipation. No abnormal wall thickening, inflammatory stranding, or abnormal enhancement seen about the bowels to suggest active inflammation.  Suprapubic catheter in place within the bladder. Gas lucency seen within the nondependent portion of the bladder lumen. Circumferential bladder wall thickening may be related to incomplete distension, chronic outlet obstruction, or possibly acute cystitis.  Uterus is absent.  Ovaries within normal limits.  No free air or fluid. No adenopathy. Normal intravascular enhancement seen throughout the abdomen and pelvis.  Generator for baclofen pump present within the subcutaneous fat of the right anterior abdomen. The electrodes terminate at the T10  level. No acute osseous abnormality. No worrisome lytic or blastic osseous lesions. Scoliosis noted.  IMPRESSION: 1. Suprapubic catheter in place. Circumferential bladder wall thickening may be related to incomplete distension, chronic outlet obstruction, or possibly acute cystitis. Correlation with urinalysis recommended. 2. Moderate to large amount  of retained stool within the rectosigmoid colon, suggesting constipation. 3. Subtle heterogeneous signal intensity within the gallbladder lumen, suggesting cholelithiasis. Common bile duct mildly prominent measuring up to 7-8 mm. No CT evidence for acute cholecystitis. 4. 1.7 cm intermediate density lesion within the left kidney. While this finding likely reflects a proteinaceous or hemorrhagic cyst, possible underlying renal malignancy is not entirely excluded. Further evaluation with renal mass protocol MRI recommended. This could be performed on a nonemergent basis. 5. Trace right pleural effusion.   Electronically Signed   By: Benjamin  McClintock M.D.   On: 10/04/2014 02:14   Rise Mug Chest Port 1 View  10/22/2014   CLINICAL DATA:  38 year old female with hypoxia and shortness of breath.  EXAM: PORTABLE CHEST - 1 VIEW  COMPARISON:  03/10/2014 and prior chest radiographs  FINDINGS: This is a very low volume film with mild bibasilar atelectasis.  The cardiomediastinal silhouette is unchanged.  There is no evidence of focal airspace disease, pulmonary edema, suspicious pulmonary nodule/mass, pleural effusion, or pneumothorax. No acute bony abnormalities are identified.  IMPRESSION: Low volume film with mild bibasilar atelectasis. No other significant abnormalities identified.   Electronically Signed   By: Laveda AbbeJeff  Hu M.D.   On: 10/22/2014 14:47   Dg Abd 2 Views  10/04/2014   CLINICAL DATA:  Abdominal pain for 4 hr.  Blood tinged urine.  EXAM: ABDOMEN - 2 VIEW  COMPARISON:  03/11/2013  FINDINGS: Electronic device with spinal catheter or stimulator. Scattered gas throughout the small and large bowel without distention. Changes most likely represent mild ileus. No radiopaque stones. Visualized bones appear intact. No free intra-abdominal air. No abnormal air-fluid levels.  IMPRESSION: Gas-filled nondistended small and large bowel most consistent with mild ileus.   Electronically Signed   By: Burman NievesWilliam  Stevens M.D.   On:  10/04/2014 00:45    Araceli Bouchealeigh N Rumley, DO 10/25/2014, 8:25 AM PGY-1, [facility] FPTS Intern pager: [phone], text pages welcome

## 2014-10-25 NOTE — Clinical Social Work Psychosocial (Signed)
Clinical Social Work Department BRIEF PSYCHOSOCIAL ASSESSMENT 10/25/2014  Patient:  [name] [name]     Account Number:  [id number]     Admit date:  10/22/2014  Clinical Social Worker:  , BRYANT, LCSWA  Date/Time:  10/24/2014 03:03 PM  Referred by:  Physician  Date Referred:  10/24/2014 Referred for  SNF Placement   Other Referral:   NA   Interview type:  Patient Other interview type:   Patient alert and oriented at time of assessment.    PSYCHOSOCIAL DATA Living Status:  FACILITY Admitted from facility:  [facility] Level of care:  Skilled Nursing Facility Primary support name:  [name] [name] Primary support relationship to patient:  PARENT Degree of support available:   Support is good.    CURRENT CONCERNS Current Concerns  Post-Acute Placement   Other Concerns:   NA    SOCIAL WORK ASSESSMENT / PLAN CSW met with patient at bedside. Patient has difficulty with speaking but she is able to verbally communicate with CSW. Patient confirms that she was admitted from [facility] and plans to return to this facility at discharge. Patient states that her main support person is her father [name] [name]. Patient appeared calm and engaged in assessment. CSW will assist.   Assessment/plan status:  Psychosocial Support/Ongoing Assessment of Needs Other assessment/ plan:   Complete Fl2, Fax, PASRR   Information/referral to community resources:   CSW contact information given.    PATIENT'S/FAMILY'S RESPONSE TO PLAN OF CARE: Patient plans to return to [facility] at discharge. Facility confirms that they have a bed for the patient. CSW will assist.  FL2 on chart for MD signature.       Bryant  MSW, LCSWA, LCASA, [phone] 

## 2014-10-25 NOTE — Progress Notes (Signed)
ANTIBIOTIC CONSULT NOTE - INITIAL  Pharmacy Consult for Vancomycin and Cefepime Indication: rule out sepsis, possible urinary source  Allergies  Allergen Reactions  . Morphine Dermatitis    Skin turned red    Patient Measurements: Height: 4\' 10"  (147.3 cm) Weight: 135 lb 14.4 oz (61.644 kg) IBW/kg (Calculated) : 40.9  Vital Signs: Temp: 100.7 F (38.2 C) (11/24 1355) Temp Source: Axillary (11/24 1355) BP: 104/67 mmHg (11/24 1355) Pulse Rate: 113 (11/24 1355) Intake/Output from previous day: 11/23 0701 - 11/24 0700 In: 840 [P.O.:840] Out: 3650 [Urine:3650] Intake/Output from this shift: Total I/O In: 120 [P.O.:120] Out: 2450 [Urine:2450]  Labs:  Recent Labs  10/23/14 0547 10/25/14 0545  WBC 12.5* 5.6  HGB 9.0* 9.6*  PLT 252 197  CREATININE 0.48* 0.41*   Estimated Creatinine Clearance: 72.6 mL/min (by C-G formula based on Cr of 0.41).   Medical History: Past Medical History  Diagnosis Date  . Cerebral palsy   . Pulmonary embolism     Lifetime Coumadin  . Cerebral palsy     Spastic Cerebral palsy, mentally intact  . Contracture, joint, multiple sites     Electric wheelchair, uses left hand to operate chair.   [name] Kitchen. Hypertension   . OCD (obsessive compulsive disorder)   . Depression   . Migraine   . [name]   . Dysarthria   . Endometriosis   . History of recurrent UTIs   . GERD (gastroesophageal reflux disease)   . Pulmonary embolism 2011, 01/2011    Will be on lifetime coumadin  . Anemia   . Abdominal pain 01/02/2012    Overview:  Overview:  Per Previous pcp note- Dr. Ta--Pt has history of endometriosis and had DUB in 2011.  She She had u/s that showed normal endometrial stripe.  She had endometrial bx that was wnl.  She has a lot of abd cramping every month.  This cramping was sometimes not relieved with hydrocodone.  [facility] for IUD placement to help endometriosis and abd cramping.  [name]  . Macroscopic  hematuria 03/10/2013    status post replacement of suprapubic tube 03/04/2013   . HYPERTENSION, BENIGN 01/31/2010    Qualifier: Diagnosis of  By: Gala RomneyBensimhon, MD, Trixie DredgeFACC, Daniel R   . History of endometriosis 10/2012    OBGYN [facility]: Laparoscopic Endometrial Ablation, TVH,  . Gram positive sepsis 10/24/2014    Medications:  See EMR  Assessment: 38 yo F presented to ED with bladder spasms, SB, and chills. Tmax/24h 103 > 100.7, tachycardic, WBC 13.8 > 5.6, LA 3.3.  CXR neg for PNA.  Pt has PMH significant for spastic cerebral palsy, chronic UTI with neurogenic bladder and suprapubic cath, and hx PE on Xarelto.  [facility] is a resident [facility].  Today is day #4 cefepime/vancomycin, since pt has still been febrile, levaquin was added today. Urine cx growing e.coli, serratia, pseudomonas, 1/2 blood cultures with coag neg staph.  Goal of Therapy:  Vancomycin trough level 10-15 mcg/ml Renal dose adjustment of antibiotics  Plan:  Vancomycin 750mg  IV q12h Cefepime 1gm q12h Would reduce dose of Levaquin to 500 mg daily, given 750 mg is only used for pneumonia Follow up renal function, culture data, and clinical progress    Agapito GamesAlison Davit Vassar, PharmD, BCPS Clinical Pharmacist Pager: [phone] 10/25/2014 3:21 PM

## 2014-10-26 LAB — LEGIONELLA ANTIGEN, URINE

## 2014-10-26 LAB — URINE CULTURE: Colony Count: >=100000

## 2014-10-26 MED ORDER — DICLOFENAC SODIUM 50 MG PO TBEC
50.0000 mg | DELAYED_RELEASE_TABLET | Freq: Once | ORAL | Status: AC
  Administered 2014-10-26: 50 mg via ORAL
  Filled 2014-10-26: qty 1

## 2014-10-26 MED ORDER — DOXYCYCLINE HYCLATE 100 MG PO TABS
100.0000 mg | ORAL_TABLET | Freq: Two times a day (BID) | ORAL | Status: DC
  Administered 2014-10-26: 100 mg via ORAL
  Filled 2014-10-26 (×3): qty 1

## 2014-10-26 NOTE — Progress Notes (Signed)
Family Medicine Teaching Service Daily Progress Note Intern Pager: [phone]  Patient name: [name] SlickerJudith [name] Medical record number: [id number] Date of birth: Sep 14, 1976 Age: 38 y.o. Gender: female  Primary Care Provider: Anselm LisMarsh, Melanie, MD Consultants: None Code Status: Full  Assessment and Plan: [name] [name] is a 38 y.o. female presenting with bladder spasm, chills and SOB. PMH is significant for spastic cerebral palsy, chronic UTI with neurogenic bladder and suprapubic catheter, and h/o PE on xarelto.  # Sepsis: Secondary to HCAP +/- Urinary Source. WBC 12.5, Tachypneic, Hypotensive (100/59 this am), febrile at admission - WBC 5.6 on 11/24 - Febrile at 100.7 yesterday afternoon (1355). Afebrile this morning. - Lactic Acid 3.33 - UA- turbid, + nitrites, large leukocytes, large hemoglobin, many bacteria, calcium oxalate crystals - Urine Gram Stain- multiple bacteria morphotypes, none predominant - Blood Cx- gram + cocci in clusters; coagulase negative Staphylococcus   - Other plates showed no growth to date - Urine Cx- gram - rods; Pseudomonas aeruginosa (pan sensitive), Serratia marcescens (resistant to cefazolin, nitrofurantoin, and intermediate effectiveness tobramycin), E. Coli (pan sensitive) - Strep Pneumo Urinary Antigen- Negative - Continue MIVF, rebolus as needed - Vancomycin (11/22>>), Cefepime (11/21>>), Levaquin (11/24>>)  - Will transition to oral Levaquin and Doxycycline today. - Wean O2 as tolerated. Currently on 2L [place] and satting 97% - Follow echocardiogram--EF 65%, no vegetations - CTA: no PE noted. Bilateral lower lobe infiltrate left >right. Small pleural effusions bilaterally.  # Parapneumonic Effusion- small - Thoracentesis not indicated at this time - Continue to monitor   # Neurogenic Bladder: with chronic suprapubic cath and bladder spasms - Foley replaced on 11/22. Consider placement of larger size  # Cerebral Palsy - Continue home antispasmodics and pain  meds - Continue home lamictal  FEN/GI: soft diet, MIVF Prophylaxis: on xarelto  Disposition: Discharge once transitioned to oral antibiotics without return of fever.  Subjective:  No acute complaints overnight. Feels much better today. Denies headache. Anxious to go back to SNF.  Objective: Temp:  [97.8 F (36.6 C)-100.7 F (38.2 C)] 97.8 F (36.6 C) (11/25 0617) Pulse Rate:  [78-113] 78 (11/25 0617) Resp:  [18-28] 28 (11/25 0617) BP: (104-137)/(67-85) 137/85 mmHg (11/25 0617) SpO2:  [96 %-100 %] 100 % (11/25 0617) Physical Exam: Gen: 38yo female resting comfortably and being fed breakfast, in no apparent distress. Note history of Cerebral Palsy. HEENT: MMM, Drooling CV: S1 and S2 noted. No murmur noted. Regular rate and rhythm. Resp: Clear to auscultation bilaterally. No wheezes noted. No increased work of breathing. On 2L  Abd: BS present, soft and non-distended, no tenderness to palpation Ext: No edema, spastic with multiple contractures unchanged from her previous exams Neuro: Alert and oriented, with baseline dysarthria and increased tone.   Laboratory:  Recent Labs Lab 10/22/14 1405 10/23/14 0547 10/25/14 0545  WBC 13.8* 12.5* 5.6  HGB 10.7* 9.0* 9.6*  HCT 34.7* 28.8* 30.7*  PLT 330 252 197    Recent Labs Lab 10/22/14 1405 10/23/14 0547 10/25/14 0545  NA 140 141 138  K 4.4 3.3* 3.9  CL 98 107 101  CO2 25 21 24   BUN 10 10 6   CREATININE 0.58 0.48* 0.41*  CALCIUM 9.3 7.9* 8.4  GLUCOSE 152* 131* 90   ProBNP 321.7 Trop 0.02 Lactic acid 3.33  Imaging/Diagnostic Tests Ct Angio Chest Pe W/cm &/or Wo Cm  10/25/2014   CLINICAL DATA:  Shortness of Breath ; history of previous pulmonary emboli  EXAM: CT ANGIOGRAPHY CHEST WITH CONTRAST  TECHNIQUE: Multidetector CT imaging  of the chest was performed using the standard protocol during bolus administration of intravenous contrast. Multiplanar CT image reconstructions and MIPs were obtained to evaluate the  vascular anatomy.  CONTRAST:  80mL OMNIPAQUE IOHEXOL 350 MG/ML SOLN  COMPARISON:  Chest radiograph October 22, 2014 and chest CT July 17, 2011  FINDINGS: There is no demonstrable pulmonary embolus. There is no thoracic aortic aneurysm or dissection.  There are bilateral pleural effusions with bibasilar lung consolidation, more on the left than on the right.  There is no appreciable thoracic adenopathy. The pericardium is not thickened. Heart is borderline enlarged.  In the visualized upper abdomen, there are multiple gallstones within the gallbladder.  There is incomplete visualization of a catheter within the thoracic region. The distal aspect of this catheter is at the level of T9. Its more proximal aspect is not visualized on this study.  There is enlargement of the thyroid with inhomogeneous attenuation, particularly on the left.  Review of the MIP images confirms the above findings.  IMPRESSION: No demonstrable pulmonary embolus.  Bilateral lower lobe infiltrate, more on the left than on the right. Small pleural effusions bilaterally.  Heart borderline enlarged.  Cholelithiasis.  Enlarged inhomogeneous thyroid. This finding may warrant thyroid ultrasound to further assess.   Electronically Signed   By: Bretta BangWilliam  Woodruff M.D.   On: 10/25/2014 10:02   Ct Abdomen Pelvis W Contrast  10/04/2014   CLINICAL DATA:  Initial evaluation for acute abdominal pain.  EXAM: CT ABDOMEN AND PELVIS WITH CONTRAST  TECHNIQUE: Multidetector CT imaging of the abdomen and pelvis was performed using the standard protocol following bolus administration of intravenous contrast.  CONTRAST:  100mL OMNIPAQUE IOHEXOL 300 MG/ML  SOLN  COMPARISON:  Prior study from earlier same day.  FINDINGS: Mild bibasilar atelectasis seen dependently within the lung bases, right greater than left. There is a trace right pleural effusion.  Liver demonstrates a normal contrast enhanced appearance. Subtle heterogeneous hyperdensity within the gallbladder  lumen may reflect stones and/ or sludge. Common bile duct mildly dilated up to 7.8 mm. Delete that  Spleen, adrenal glands, and pancreas demonstrate a normal contrast enhanced appearance.  Simple 1.7 cm hypodense lesion within the left kidney measures intermediate density at 35 Hounsfield units, indeterminate. No nephrolithiasis or hydronephrosis.  Stomach within normal limits. No evidence of bowel obstruction. Moderate to large amount of retained stool seen within the distal colon, suggesting constipation. No abnormal wall thickening, inflammatory stranding, or abnormal enhancement seen about the bowels to suggest active inflammation.  Suprapubic catheter in place within the bladder. Gas lucency seen within the nondependent portion of the bladder lumen. Circumferential bladder wall thickening may be related to incomplete distension, chronic outlet obstruction, or possibly acute cystitis.  Uterus is absent.  Ovaries within normal limits.  No free air or fluid. No adenopathy. Normal intravascular enhancement seen throughout the abdomen and pelvis.  Generator for baclofen pump present within the subcutaneous fat of the right anterior abdomen. The electrodes terminate at the T10 level. No acute osseous abnormality. No worrisome lytic or blastic osseous lesions. Scoliosis noted.  IMPRESSION: 1. Suprapubic catheter in place. Circumferential bladder wall thickening may be related to incomplete distension, chronic outlet obstruction, or possibly acute cystitis. Correlation with urinalysis recommended. 2. Moderate to large amount of retained stool within the rectosigmoid colon, suggesting constipation. 3. Subtle heterogeneous signal intensity within the gallbladder lumen, suggesting cholelithiasis. Common bile duct mildly prominent measuring up to 7-8 mm. No CT evidence for acute cholecystitis. 4. 1.7 cm intermediate  density lesion within the left kidney. While this finding likely reflects a proteinaceous or hemorrhagic  cyst, possible underlying renal malignancy is not entirely excluded. Further evaluation with renal mass protocol MRI recommended. This could be performed on a nonemergent basis. 5. Trace right pleural effusion.   Electronically Signed   By: Rise Mu M.D.   On: 10/04/2014 02:14   Dg Chest Port 1 View  10/22/2014   CLINICAL DATA:  38 year old female with hypoxia and shortness of breath.  EXAM: PORTABLE CHEST - 1 VIEW  COMPARISON:  03/10/2014 and prior chest radiographs  FINDINGS: This is a very low volume film with mild bibasilar atelectasis.  The cardiomediastinal silhouette is unchanged.  There is no evidence of focal airspace disease, pulmonary edema, suspicious pulmonary nodule/mass, pleural effusion, or pneumothorax. No acute bony abnormalities are identified.  IMPRESSION: Low volume film with mild bibasilar atelectasis. No other significant abnormalities identified.   Electronically Signed   By: Laveda Abbe M.D.   On: 10/22/2014 14:47   Dg Abd 2 Views  10/04/2014   CLINICAL DATA:  Abdominal pain for 4 hr.  Blood tinged urine.  EXAM: ABDOMEN - 2 VIEW  COMPARISON:  03/11/2013  FINDINGS: Electronic device with spinal catheter or stimulator. Scattered gas throughout the small and large bowel without distention. Changes most likely represent mild ileus. No radiopaque stones. Visualized bones appear intact. No free intra-abdominal air. No abnormal air-fluid levels.  IMPRESSION: Gas-filled nondistended small and large bowel most consistent with mild ileus.   Electronically Signed   By: Burman Nieves M.D.   On: 10/04/2014 00:45    Araceli Bouche, DO 10/26/2014, 7:24 AM PGY-1, [facility] FPTS Intern pager: [phone], text pages welcome

## 2014-10-27 MED ORDER — SERTRALINE HCL 50 MG PO TABS
50.0000 mg | ORAL_TABLET | Freq: Every day | ORAL | Status: DC
  Administered 2014-10-28: 50 mg via ORAL
  Filled 2014-10-27: qty 1

## 2014-10-27 MED ORDER — DOXYCYCLINE HYCLATE 100 MG PO TABS
100.0000 mg | ORAL_TABLET | Freq: Two times a day (BID) | ORAL | Status: DC
  Administered 2014-10-27 – 2014-10-28 (×3): 100 mg via ORAL
  Filled 2014-10-27 (×4): qty 1

## 2014-10-27 NOTE — Progress Notes (Signed)
Family Medicine Teaching Service Daily Progress Note Intern Pager: [phone]  Patient name: [name] [name] Medical record number: [id number] Date of birth: [birth year] Age: 38 y.o. Gender: female  Primary Care Provider: Anselm Lis, MD Consultants: None Code Status: Full  Assessment and Plan: [name] [name] is a 38 y.o. female presenting with bladder spasm, chills and SOB. PMH is significant for spastic cerebral palsy, chronic UTI with neurogenic bladder and suprapubic catheter, and h/o PE on xarelto.  # Sepsis: Secondary to HCAP +/- Urinary Source. Successfully transitioned to PO abx last night  - oral Levaquin and Doxycycline today - Wean O2 as tolerated. Took her down to 1L while in room - Follow echocardiogram--EF 65%, no vegetations - CTA: no PE noted. Bilateral lower lobe infiltrate left >right. Small pleural effusions bilaterally.  # Parapneumonic Effusion- small - Thoracentesis not indicated at this time - Continue to monitor   # Neurogenic Bladder: with chronic suprapubic cath and bladder spasms - Foley replaced on 11/22. Consider placement of larger size  # Cerebral Palsy - Continue home antispasmodics and pain meds - Continue home lamictal  FEN/GI: soft diet, MIVF Prophylaxis: on xarelto  Disposition: monitor PO abx X24hrs and off O2  Subjective: Wondering if she can go home today; it huts to breathe   Objective: Temp:  [97.9 F (36.6 C)-98.8 F (37.1 C)] 98.1 F (36.7 C) (11/26 0533) Pulse Rate:  [101] 101 (11/25 1309) Resp:  [18-20] 18 (11/26 0533) BP: (123-146)/(46-88) 123/88 mmHg (11/26 0533) SpO2:  [97 %-100 %] 98 % (11/26 0533) Physical Exam: Gen: 38yo female resting comfortably HEENT: MMM, Drooling CV: S1 and S2 noted. No murmur noted. Regular rate and rhythm. Resp: Clear to auscultation bilaterally. No wheezes noted. No increased work of breathing. On 1L [place] Abd: BS present, soft and non-distended, no tenderness to palpation Ext: No edema, spastic  with multiple contractures unchanged from her previous exams Neuro: Alert and oriented, with baseline dysarthria and increased tone.   Laboratory:  Recent Labs Lab 10/22/14 1405 10/23/14 0547 10/25/14 0545  WBC 13.8* 12.5* 5.6  HGB 10.7* 9.0* 9.6*  HCT 34.7* 28.8* 30.7*  PLT 330 252 197    Recent Labs Lab 10/22/14 1405 10/23/14 0547 10/25/14 0545  NA 140 141 138  K 4.4 3.3* 3.9  CL 98 107 101  CO2 25 21 24   BUN 10 10 6   CREATININE 0.58 0.48* 0.41*  CALCIUM 9.3 7.9* 8.4  GLUCOSE 152* 131* 90   ProBNP 321.7 Trop 0.02 Lactic acid 3.33  Imaging/Diagnostic Tests Ct Angio Chest Pe W/cm &/or Wo Cm  10/25/2014   CLINICAL DATA:  Shortness of Breath ; history of previous pulmonary emboli  EXAM: CT ANGIOGRAPHY CHEST WITH CONTRAST  TECHNIQUE: Multidetector CT imaging of the chest was performed using the standard protocol during bolus administration of intravenous contrast. Multiplanar CT image reconstructions and MIPs were obtained to evaluate the vascular anatomy.  CONTRAST:  80mL OMNIPAQUE IOHEXOL 350 MG/ML SOLN  COMPARISON:  Chest radiograph October 22, 2014 and chest CT July 17, 2011  FINDINGS: There is no demonstrable pulmonary embolus. There is no thoracic aortic aneurysm or dissection.  There are bilateral pleural effusions with bibasilar lung consolidation, more on the left than on the right.  There is no appreciable thoracic adenopathy. The pericardium is not thickened. Heart is borderline enlarged.  In the visualized upper abdomen, there are multiple gallstones within the gallbladder.  There is incomplete visualization of a catheter within the thoracic region. The distal aspect of this catheter  is at the level of T9. Its more proximal aspect is not visualized on this study.  There is enlargement of the thyroid with inhomogeneous attenuation, particularly on the left.  Review of the MIP images confirms the above findings.  IMPRESSION: No demonstrable pulmonary embolus.  Bilateral  lower lobe infiltrate, more on the left than on the right. Small pleural effusions bilaterally.  Heart borderline enlarged.  Cholelithiasis.  Enlarged inhomogeneous thyroid. This finding may warrant thyroid ultrasound to further assess.   Electronically Signed   By: Bretta BangWilliam  Woodruff M.D.   On: 10/25/2014 10:02   Ct Abdomen Pelvis W Contrast  10/04/2014   CLINICAL DATA:  Initial evaluation for acute abdominal pain.  EXAM: CT ABDOMEN AND PELVIS WITH CONTRAST  TECHNIQUE: Multidetector CT imaging of the abdomen and pelvis was performed using the standard protocol following bolus administration of intravenous contrast.  CONTRAST:  100mL OMNIPAQUE IOHEXOL 300 MG/ML  SOLN  COMPARISON:  Prior study from earlier same day.  FINDINGS: Mild bibasilar atelectasis seen dependently within the lung bases, right greater than left. There is a trace right pleural effusion.  Liver demonstrates a normal contrast enhanced appearance. Subtle heterogeneous hyperdensity within the gallbladder lumen may reflect stones and/ or sludge. Common bile duct mildly dilated up to 7.8 mm. Delete that  Spleen, adrenal glands, and pancreas demonstrate a normal contrast enhanced appearance.  Simple 1.7 cm hypodense lesion within the left kidney measures intermediate density at 35 Hounsfield units, indeterminate. No nephrolithiasis or hydronephrosis.  Stomach within normal limits. No evidence of bowel obstruction. Moderate to large amount of retained stool seen within the distal colon, suggesting constipation. No abnormal wall thickening, inflammatory stranding, or abnormal enhancement seen about the bowels to suggest active inflammation.  Suprapubic catheter in place within the bladder. Gas lucency seen within the nondependent portion of the bladder lumen. Circumferential bladder wall thickening may be related to incomplete distension, chronic outlet obstruction, or possibly acute cystitis.  Uterus is absent.  Ovaries within normal limits.  No free  air or fluid. No adenopathy. Normal intravascular enhancement seen throughout the abdomen and pelvis.  Generator for baclofen pump present within the subcutaneous fat of the right anterior abdomen. The electrodes terminate at the T10 level. No acute osseous abnormality. No worrisome lytic or blastic osseous lesions. Scoliosis noted.  IMPRESSION: 1. Suprapubic catheter in place. Circumferential bladder wall thickening may be related to incomplete distension, chronic outlet obstruction, or possibly acute cystitis. Correlation with urinalysis recommended. 2. Moderate to large amount of retained stool within the rectosigmoid colon, suggesting constipation. 3. Subtle heterogeneous signal intensity within the gallbladder lumen, suggesting cholelithiasis. Common bile duct mildly prominent measuring up to 7-8 mm. No CT evidence for acute cholecystitis. 4. 1.7 cm intermediate density lesion within the left kidney. While this finding likely reflects a proteinaceous or hemorrhagic cyst, possible underlying renal malignancy is not entirely excluded. Further evaluation with renal mass protocol MRI recommended. This could be performed on a nonemergent basis. 5. Trace right pleural effusion.   Electronically Signed   By: Rise MuBenjamin  McClintock M.D.   On: 10/04/2014 02:14   Dg Chest Port 1 View  10/22/2014   CLINICAL DATA:  38 year old female with hypoxia and shortness of breath.  EXAM: PORTABLE CHEST - 1 VIEW  COMPARISON:  03/10/2014 and prior chest radiographs  FINDINGS: This is a very low volume film with mild bibasilar atelectasis.  The cardiomediastinal silhouette is unchanged.  There is no evidence of focal airspace disease, pulmonary edema, suspicious pulmonary nodule/mass, pleural  effusion, or pneumothorax. No acute bony abnormalities are identified.  IMPRESSION: Low volume film with mild bibasilar atelectasis. No other significant abnormalities identified.   Electronically Signed   By: Laveda AbbeJeff  Hu M.D.   On: 10/22/2014  14:47   Dg Abd 2 Views  10/04/2014   CLINICAL DATA:  Abdominal pain for 4 hr.  Blood tinged urine.  EXAM: ABDOMEN - 2 VIEW  COMPARISON:  03/11/2013  FINDINGS: Electronic device with spinal catheter or stimulator. Scattered gas throughout the small and large bowel without distention. Changes most likely represent mild ileus. No radiopaque stones. Visualized bones appear intact. No free intra-abdominal air. No abnormal air-fluid levels.  IMPRESSION: Gas-filled nondistended small and large bowel most consistent with mild ileus.   Electronically Signed   By: Burman NievesWilliam  Stevens M.D.   On: 10/04/2014 00:45    Charlane FerrettiMelanie C Julien Berryman, MD 10/27/2014, 9:48 AM PGY-2, [facility] FPTS Intern pager: [phone], text pages welcome

## 2014-10-27 NOTE — Discharge Summary (Signed)
Family Medicine Teaching [facility] Discharge Summary  Patient name: [name] [name] Medical record number: [id number] Date of birth: 06-29-1976 Age: 38 y.o. Gender: female Date of Admission: 10/22/2014  Date of Discharge: 11/27 Admitting Physician: Janit Pagan, MD  Primary Care Provider: Anselm Lis, MD Consultants: None  Indication for Hospitalization: Sepsis  Discharge Diagnoses/Problem List:  Sepsis, secondary to HCAP and UTI Cerebral Palsy OCD Depression Neurogenic Bladder with Indwelling Suprapubic Catheter Parapneumonic Effusion  Disposition: Discharge to SNF  Discharge Condition: Stable  Brief Hospital Course:  [name] [name] presented to the ED on 11/21 with fever, shortness of breath, and suprapubic pain. WBC was 13.8 at admission, febrile to 104, and tachycardic to 155, qualifying diagnosis of sepsis. Lactic Acid 3.33. Shortness of breath was improved with 3L [place]. She was initiated on Cefepime on 11/21 for probably urinary source.  Vancomycin was also initiated on 11/22 for double coverage of Pseudomonas. Foley catheter was removed and replaced on 11/22. Urinalysis showed positive nitrites, large leukocytes, large hemoglobin, and many bacteria. Urine Culture showed gram negative rods. Blood culture showed gram positive cocci in clusters, however since other plates showed no growth this is believed to be contaminate.   Acute worsening of shortness of breath was noted on 11/24. With PE in the differential as contributing to her shortness of breath and fever, it was decided to obtain a CTA for possible embolus. CTA showed no PE, however did reveal a bilateral lower lobe infiltrate and small pleural effusions bilaterally. Levaquin was added on 11/24 for treatment of HCAP. Sensitivities returned on 11/25 showing the urine culture grew Pseudomonas, Serratia, and E. Coli and that current antibiotic regimen was appropriate. On the triple antibiotic regimen, [name] [name] became  afebrile for 24hours. IV antibiotics were discontinued and oral antibiotics initiated on 10/26/14. Oxygen was weaned and [name] [name] was discharged on 1L [place].  Issues for Follow Up:  - Discharged on Doxycycline and Levaquin Day #6/8 - Follow up O2 sats on room air. Discharged on 1L . - Continued Vesicare. Re-evalute next week to determine if discontinuation is needed.  Significant Procedures: None  Significant Labs and Imaging:   Recent Labs Lab 10/22/14 1405 10/23/14 0547 10/25/14 0545  WBC 13.8* 12.5* 5.6  HGB 10.7* 9.0* 9.6*  HCT 34.7* 28.8* 30.7*  PLT 330 252 197    Recent Labs Lab 10/22/14 1405 10/23/14 0547 10/25/14 0545  NA 140 141 138  K 4.4 3.3* 3.9  CL 98 107 101  CO2 25 21 24   GLUCOSE 152* 131* 90  BUN 10 10 6   CREATININE 0.58 0.48* 0.41*  CALCIUM 9.3 7.9* 8.4   Urinalysis    Component Value Date/Time   COLORURINE YELLOW 10/22/2014 2330   APPEARANCEUR TURBID* 10/22/2014 2330   LABSPEC 1.012 10/22/2014 2330   PHURINE 5.5 10/22/2014 2330   GLUCOSEU NEGATIVE 10/22/2014 2330   HGBUR LARGE* 10/22/2014 2330   HGBUR large 10/03/2010 1530   BILIRUBINUR NEGATIVE 10/22/2014 2330   BILIRUBINUR neg 03/15/2011 1719   KETONESUR NEGATIVE 10/22/2014 2330   PROTEINUR 100* 10/22/2014 2330   PROTEINUR neg 03/15/2011 1719   UROBILINOGEN 0.2 10/22/2014 2330   UROBILINOGEN 0.2 03/15/2011 1719   NITRITE POSITIVE* 10/22/2014 2330   NITRITE neg 03/15/2011 1719   LEUKOCYTESUR LARGE* 10/22/2014 2330  - Troponin negative - BNP- 321.7 - Lactic Acid 3.33 - Legionella Urine Antigen- negative - Strep Pneumo Urinary Antigen- negative  Ct Angio Chest Pe W/cm &/or Wo Cm  10/25/2014   CLINICAL DATA:  Shortness of Breath ;  history of previous pulmonary emboli  EXAM: CT ANGIOGRAPHY CHEST WITH CONTRAST  TECHNIQUE: Multidetector CT imaging of the chest was performed using the standard protocol during bolus administration of intravenous contrast. Multiplanar CT image  reconstructions and MIPs were obtained to evaluate the vascular anatomy.  CONTRAST:  80mL OMNIPAQUE IOHEXOL 350 MG/ML SOLN  COMPARISON:  Chest radiograph October 22, 2014 and chest CT July 17, 2011  FINDINGS: There is no demonstrable pulmonary embolus. There is no thoracic aortic aneurysm or dissection.  There are bilateral pleural effusions with bibasilar lung consolidation, more on the left than on the right.  There is no appreciable thoracic adenopathy. The pericardium is not thickened. Heart is borderline enlarged.  In the visualized upper abdomen, there are multiple gallstones within the gallbladder.  There is incomplete visualization of a catheter within the thoracic region. The distal aspect of this catheter is at the level of T9. Its more proximal aspect is not visualized on this study.  There is enlargement of the thyroid with inhomogeneous attenuation, particularly on the left.  Review of the MIP images confirms the above findings.  IMPRESSION: No demonstrable pulmonary embolus.  Bilateral lower lobe infiltrate, more on the left than on the right. Small pleural effusions bilaterally.  Heart borderline enlarged.  Cholelithiasis.  Enlarged inhomogeneous thyroid. This finding may warrant thyroid ultrasound to further assess.   Electronically Signed   By: Bretta Bang M.D.   On: 10/25/2014 10:02   Ct Abdomen Pelvis W Contrast  10/04/2014   CLINICAL DATA:  Initial evaluation for acute abdominal pain.  EXAM: CT ABDOMEN AND PELVIS WITH CONTRAST  TECHNIQUE: Multidetector CT imaging of the abdomen and pelvis was performed using the standard protocol following bolus administration of intravenous contrast.  CONTRAST:  OMNIPAQUE IOHEXOL 300 MG/ML  SOLN  COMPARISON:  Prior study from earlier same day.  FINDINGS: Mild bibasilar atelectasis seen dependently within the lung bases, right greater than left. There is a trace right pleural effusion.  Liver demonstrates a normal contrast enhanced appearance.  Subtle heterogeneous hyperdensity within the gallbladder lumen may reflect stones and/ or sludge. Common bile duct mildly dilated up to 7.8 mm. Delete that  Spleen, adrenal glands, and pancreas demonstrate a normal contrast enhanced appearance.  Simple 1.7 cm hypodense lesion within the left kidney measures intermediate density at 35 Hounsfield units, indeterminate. No nephrolithiasis or hydronephrosis.  Stomach within normal limits. No evidence of bowel obstruction. Moderate to large amount of retained stool seen within the distal colon, suggesting constipation. No abnormal wall thickening, inflammatory stranding, or abnormal enhancement seen about the bowels to suggest active inflammation.  Suprapubic catheter in place within the bladder. Gas lucency seen within the nondependent portion of the bladder lumen. Circumferential bladder wall thickening may be related to incomplete distension, chronic outlet obstruction, or possibly acute cystitis.  Uterus is absent.  Ovaries within normal limits.  No free air or fluid. No adenopathy. Normal intravascular enhancement seen throughout the abdomen and pelvis.  Generator for baclofen pump present within the subcutaneous fat of the right anterior abdomen. The electrodes terminate at the T10 level. No acute osseous abnormality. No worrisome lytic or blastic osseous lesions. Scoliosis noted.  IMPRESSION: 1. Suprapubic catheter in place. Circumferential bladder wall thickening may be related to incomplete distension, chronic outlet obstruction, or possibly acute cystitis. Correlation with urinalysis recommended. 2. Moderate to large amount of retained stool within the rectosigmoid colon, suggesting constipation. 3. Subtle heterogeneous signal intensity within the gallbladder lumen, suggesting cholelithiasis. Common bile duct  mildly prominent measuring up to 7-8 mm. No CT evidence for acute cholecystitis. 4. 1.7 cm intermediate density lesion within the left kidney. While this  finding likely reflects a proteinaceous or hemorrhagic cyst, possible underlying renal malignancy is not entirely excluded. Further evaluation with renal mass protocol MRI recommended. This could be performed on a nonemergent basis. 5. Trace right pleural effusion.   Electronically Signed   By: Rise MuBenjamin  McClintock M.D.   On: 10/04/2014 02:14   Dg Chest Port 1 View  10/22/2014   CLINICAL DATA:  38 year old female with hypoxia and shortness of breath.  EXAM: PORTABLE CHEST - 1 VIEW  COMPARISON:  03/10/2014 and prior chest radiographs  FINDINGS: This is a very low volume film with mild bibasilar atelectasis.  The cardiomediastinal silhouette is unchanged.  There is no evidence of focal airspace disease, pulmonary edema, suspicious pulmonary nodule/mass, pleural effusion, or pneumothorax. No acute bony abnormalities are identified.  IMPRESSION: Low volume film with mild bibasilar atelectasis. No other significant abnormalities identified.   Electronically Signed   By: Laveda AbbeJeff  Hu M.D.   On: 10/22/2014 14:47   Dg Abd 2 Views  10/04/2014   CLINICAL DATA:  Abdominal pain for 4 hr.  Blood tinged urine.  EXAM: ABDOMEN - 2 VIEW  COMPARISON:  03/11/2013  FINDINGS: Electronic device with spinal catheter or stimulator. Scattered gas throughout the small and large bowel without distention. Changes most likely represent mild ileus. No radiopaque stones. Visualized bones appear intact. No free intra-abdominal air. No abnormal air-fluid levels.  IMPRESSION: Gas-filled nondistended small and large bowel most consistent with mild ileus.   Electronically Signed   By: Burman NievesWilliam  Stevens M.D.   On: 10/04/2014 00:45   Results/Tests Pending at Time of Discharge: None  Discharge Medications:    Medication List    ASK your doctor about these medications        acetaminophen 325 MG tablet  Commonly known as:  TYLENOL  Take 650 mg by mouth every 8 (eight) hours.     acetic acid 2 % otic solution  Commonly known as:  VOSOL   Place 4 drops into both ears once a week.     ARIPIPRAZOLE PO  Take 1 mg by mouth daily.     bacitracin-polymyxin b ointment  Commonly known as:  POLYSPORIN  Apply 1 application topically 2 (two) times daily as needed (Redness around cath site).     baclofen 10 MG tablet  Commonly known as:  LIORESAL  Take 20 mg by mouth 4 (four) times daily.     cetirizine 10 MG tablet  Commonly known as:  ZYRTEC  Take 5 mg by mouth daily.     clonazePAM 1 MG tablet  Commonly known as:  KLONOPIN  Take 1 tablet (1 mg total) by mouth 4 (four) times daily.     diazepam 5 MG tablet  Commonly known as:  VALIUM  Take 5 mg by mouth every 6 (six) hours as needed for muscle spasms.     docusate sodium 100 MG capsule  Commonly known as:  COLACE  Take 100 mg by mouth 2 (two) times daily.     HYDROcodone-acetaminophen 5-325 MG per tablet  Commonly known as:  NORCO/VICODIN  Take 1 tablet by mouth every 6 (six) hours as needed (pain in both knees).     ibuprofen 400 MG tablet  Commonly known as:  ADVIL,MOTRIN  Take 400 mg by mouth every 8 (eight) hours as needed for mild pain.  IMITREX 100 MG tablet  Generic drug:  SUMAtriptan  Take 100 mg by mouth every 2 (two) hours as needed for migraine or headache. May repeat in 2 hours if headache persists or recurs.     lamoTRIgine 200 MG tablet  Commonly known as:  LAMICTAL  Take 200 mg by mouth daily.     lidocaine 5 %  Commonly known as:  LIDODERM  Place 1 patch onto the skin daily. Remove & Discard patch within 12 hours or as directed by MD     multivitamin tablet  Take 1 tablet by mouth daily.     polyethylene glycol packet  Commonly known as:  MIRALAX / GLYCOLAX  Take 17 g by mouth 2 (two) times daily.     QUEtiapine 50 MG tablet  Commonly known as:  SEROQUEL  Take 50 mg by mouth at bedtime.     rivaroxaban 20 MG Tabs tablet  Commonly known as:  XARELTO  Take 20 mg by mouth daily with supper.     sertraline 50 MG tablet  Commonly  known as:  ZOLOFT  Take 50 mg by mouth daily. Start 10/20/14: 2 tab daily x 7 day, then 1 tab daily x 7 days, then 1/2 tab daily x 14 days, then stop Zoloft, start Fluoxetine 20 mg daily.     tiZANidine 4 MG tablet  Commonly known as:  ZANAFLEX  Take 4 mg by mouth 4 (four) times daily.       Discharge Instructions: Please refer to Patient Instructions section of EMR for full details.  Patient was counseled important signs and symptoms that should prompt return to medical care, changes in medications, dietary instructions, activity restrictions, and follow up appointments.   Follow-Up Appointments:  Araceli BoucheRaleigh N Elzora Cullins, DO 10/28/2014, 10:55 AM PGY-1, [facility]

## 2014-10-27 NOTE — Plan of Care (Signed)
Problem: Phase III Progression Outcomes Goal: Pain controlled on oral analgesia Outcome: Progressing     

## 2014-10-27 NOTE — Discharge Instructions (Addendum)
Urinary Tract Infection Urinary tract infections (UTIs) can develop anywhere along your urinary tract. Your urinary tract is your body's drainage system for removing wastes and extra water. Your urinary tract includes two kidneys, two ureters, a bladder, and a urethra. Your kidneys are a pair of bean-shaped organs. Each kidney is about the size of your fist. They are located below your ribs, one on each side of your spine. CAUSES Infections are caused by microbes, which are microscopic organisms, including fungi, viruses, and bacteria. These organisms are so small that they can only be seen through a microscope. Bacteria are the microbes that most commonly cause UTIs. SYMPTOMS  Symptoms of UTIs may vary by age and gender of the patient and by the location of the infection. Symptoms in young women typically include a frequent and intense urge to urinate and a painful, burning feeling in the bladder or urethra during urination. Older women and men are more likely to be tired, shaky, and weak and have muscle aches and abdominal pain. A fever may mean the infection is in your kidneys. Other symptoms of a kidney infection include pain in your back or sides below the ribs, nausea, and vomiting. DIAGNOSIS To diagnose a UTI, your caregiver will ask you about your symptoms. Your caregiver also will ask to provide a urine sample. The urine sample will be tested for bacteria and white blood cells. White blood cells are made by your body to help fight infection. TREATMENT  Typically, UTIs can be treated with medication. Because most UTIs are caused by a bacterial infection, they usually can be treated with the use of antibiotics. The choice of antibiotic and length of treatment depend on your symptoms and the type of bacteria causing your infection. HOME CARE INSTRUCTIONS  If you were prescribed antibiotics, take them exactly as your caregiver instructs you. Finish the medication even if you feel better after you  have only taken some of the medication.  Drink enough water and fluids to keep your urine clear or pale yellow.  Avoid caffeine, tea, and carbonated beverages. They tend to irritate your bladder.  Empty your bladder often. Avoid holding urine for long periods of time.  Empty your bladder before and after sexual intercourse.  After a bowel movement, women should cleanse from front to back. Use each tissue only once. SEEK MEDICAL CARE IF:   You have back pain.  You develop a fever.  Your symptoms do not begin to resolve within 3 days. SEEK IMMEDIATE MEDICAL CARE IF:   You have severe back pain or lower abdominal pain.  You develop chills.  You have nausea or vomiting.  You have continued burning or discomfort with urination. MAKE SURE YOU:   Understand these instructions.  Will watch your condition.  Will get help right away if you are not doing well or get worse. Document Released: 08/28/2005 Document Revised: 05/19/2012 Document Reviewed: 12/27/2011 [facility] Patient Information 2015 [place]. This information is not intended to replace advice given to you by your health care provider. Make sure you discuss any questions you have with your health care provider.   Pneumonia Pneumonia is an infection of the lungs.  CAUSES Pneumonia may be caused by bacteria or a virus. Usually, these infections are caused by breathing infectious particles into the lungs (respiratory tract). SIGNS AND SYMPTOMS   Cough.  Fever.  Chest pain.  Increased rate of breathing.  Wheezing.  Mucus production. DIAGNOSIS  If you have the common symptoms of pneumonia, your  health care provider will typically confirm the diagnosis with a chest X-ray. The X-ray will show an abnormality in the lung (pulmonary infiltrate) if you have pneumonia. Other tests of your blood, urine, or sputum may be done to find the specific cause of your pneumonia. Your health care provider may also do tests  (blood gases or pulse oximetry) to see how well your lungs are working. TREATMENT  Some forms of pneumonia may be spread to other people when you cough or sneeze. You may be asked to wear a mask before and during your exam. Pneumonia that is caused by bacteria is treated with antibiotic medicine. Pneumonia that is caused by the influenza virus may be treated with an antiviral medicine. Most other viral infections must run their course. These infections will not respond to antibiotics.  HOME CARE INSTRUCTIONS   Cough suppressants may be used if you are losing too much rest. However, coughing protects you by clearing your lungs. You should avoid using cough suppressants if you can.  Your health care provider may have prescribed medicine if he or she thinks your pneumonia is caused by bacteria or influenza. Finish your medicine even if you start to feel better.  Your health care provider may also prescribe an expectorant. This loosens the mucus to be coughed up.  Take medicines only as directed by your health care provider.  Do not smoke. Smoking is a common cause of bronchitis and can contribute to pneumonia. If you are a smoker and continue to smoke, your cough may last several weeks after your pneumonia has cleared.  A cold steam vaporizer or humidifier in your room or home may help loosen mucus.  Coughing is often worse at night. Sleeping in a semi-upright position in a recliner or using a couple pillows under your head will help with this.  Get rest as you feel it is needed. Your body will usually let you know when you need to rest. PREVENTION A pneumococcal shot (vaccine) is available to prevent a common bacterial cause of pneumonia. This is usually suggested for:  People over 38 years old.  Patients on chemotherapy.  People with chronic lung problems, such as bronchitis or emphysema.  People with immune system problems. If you are over 65 or have a high risk condition, you may  receive the pneumococcal vaccine if you have not received it before. In some countries, a routine influenza vaccine is also recommended. This vaccine can help prevent some cases of pneumonia.You may be offered the influenza vaccine as part of your care. If you smoke, it is time to quit. You may receive instructions on how to stop smoking. Your health care provider can provide medicines and counseling to help you quit. SEEK MEDICAL CARE IF: You have a fever. SEEK IMMEDIATE MEDICAL CARE IF:   Your illness becomes worse. This is especially true if you are elderly or weakened from any other disease.  You cannot control your cough with suppressants and are losing sleep.  You begin coughing up blood.  You develop pain which is getting worse or is uncontrolled with medicines.  Any of the symptoms which initially brought you in for treatment are getting worse rather than better.  You develop shortness of breath or chest pain. MAKE SURE YOU:   Understand these instructions.  Will watch your condition.  Will get help right away if you are not doing well or get worse. Document Released: 11/18/2005 Document Revised: 04/04/2014 Document Reviewed: 02/07/2011 ExitCare Patient Information 2015 [place],  LL[employer]. This information is not intended to replace advice given to you by your health care provider. Make sure you discuss any questions you have with your health care provider.   Information on my medicine - XARELTO (rivaroxaban)  This medication education was reviewed with me or my healthcare representative as part of my discharge preparation.  The pharmacist that spoke with me during my hospital stay was:  Wilhemina BonitoLee, Samson C, [facility]PH  WHY WAS Carlena HurlXARELTO PRESCRIBED FOR YOU? Xarelto was prescribed to treat blood clots that may have been found in the veins of your legs (deep vein thrombosis) or in your lungs (pulmonary embolism) and to reduce the risk of them occurring again.  What do you need to know  about Xarelto? DO NOT stop taking Xarelto without talking to the health care provider who prescribed the medication.  Refill your prescription for 20 mg tablets before you run out.  After discharge, you should have regular check-up appointments with your healthcare provider that is prescribing your Xarelto.  In the future your dose may need to be changed if your kidney function changes by a significant amount.  What do you do if you miss a dose? If you are taking Xarelto TWICE DAILY and you miss a dose, take it as soon as you remember. You may take two 15 mg tablets (total 30 mg) at the same time then resume your regularly scheduled 15 mg twice daily the next day.  If you are taking Xarelto ONCE DAILY and you miss a dose, take it as soon as you remember on the same day then continue your regularly scheduled once daily regimen the next day. Do not take two doses of Xarelto at the same time.   Important Safety Information Xarelto is a blood thinner medicine that can cause bleeding. You should call your healthcare provider right away if you experience any of the following: ? Bleeding from an injury or your nose that does not stop. ? Unusual colored urine (red or dark brown) or unusual colored stools (red or black). ? Unusual bruising for unknown reasons. ? A serious fall or if you hit your head (even if there is no bleeding).  Some medicines may interact with Xarelto and might increase your risk of bleeding while on Xarelto. To help avoid this, consult your healthcare provider or pharmacist prior to using any new prescription or non-prescription medications, including herbals, vitamins, non-steroidal anti-inflammatory drugs (NSAIDs) and supplements.  This website has more information on Xarelto: [email or web address].xarelto.[email or web address].

## 2014-10-28 LAB — CULTURE, BLOOD (ROUTINE X 2): CULTURE: NO GROWTH

## 2014-10-28 LAB — GLUCOSE, CAPILLARY: Glucose-Capillary: 114 mg/dL — ABNORMAL HIGH (ref 70–99)

## 2014-10-28 MED ORDER — DOXYCYCLINE HYCLATE 100 MG PO TABS: 100.0000 mg | ORAL_TABLET | Freq: Two times a day (BID) | ORAL | Status: AC

## 2014-10-28 MED ORDER — SOLIFENACIN SUCCINATE 5 MG PO TABS: 5.0000 mg | ORAL_TABLET | Freq: Every day | ORAL | Status: DC

## 2014-10-28 MED ORDER — LEVOFLOXACIN 750 MG PO TABS: 750.0000 mg | ORAL_TABLET | Freq: Every day | ORAL | Status: AC

## 2014-10-28 NOTE — Progress Notes (Signed)
Pt prepared for d/c to SNF. IV d/c'd. Skin intact except as most recently charted. Vitals are stable. Report called to receiving facility. Pt to be transported by ambulance service. 

## 2014-10-28 NOTE — Clinical Social Work Note (Signed)
Clinical Social Worker facilitated patient discharge including contacting patient family and facility to confirm patient discharge plans.  Clinical information faxed to facility and family agreeable with plan.  [facility]  RN to call report prior to discharge.  Clinical Social Worker will sign off for now as social work intervention is no longer needed. Please consult us again if new need arises.  Derenda FennelBashira Ric Rosenberg, MSW, LCSWA [phone] 10/28/2014 12:02 PM

## 2014-10-28 NOTE — Consult Note (Signed)
WOC consult requested for ostomy.  Pt does not have an ostomy according to the EMR.  Called bedside nurse and she confirmed pt does not have an ostomy.   Please re-consult if further assistance is needed.  Thank-you,  Cammie Mcgeeawn Harvy Riera MSN, RN, CWOCN, [place]CN-AP, CNS [phone]

## 2014-10-28 NOTE — Progress Notes (Signed)
Family Medicine Teaching Service Daily Progress Note Intern Pager: [phone]  Patient name: [name] [name] Medical record number: [id number] Date of birth: 09-07-1976 Age: 38 y.o. Gender: female  Primary Care Provider: Anselm Lis, MD Consultants: None Code Status: Full  Assessment and Plan: [name] [name] is a 38 y.o. female presenting with bladder spasm, chills and SOB. PMH is significant for spastic cerebral palsy, chronic UTI with neurogenic bladder and suprapubic catheter, and h/o PE on xarelto.  # Sepsis: Secondary to HCAP +/- Urinary Source. Successfully transitioned to PO abx last night  - Oral Levaquin (Day # 4) and Doxycycline (Day #2) today  - Total Abx Day # 6 - Wean O2 as tolerated. Currently on 1L.  - Placed on room air. Will monitor. - Follow echocardiogram--EF 65%, no vegetations - CTA: no PE noted. Bilateral lower lobe infiltrate left >right. Small pleural effusions bilaterally.  # Parapneumonic Effusion- small - Thoracentesis not indicated at this time - Continue to monitor   # Neurogenic Bladder: with chronic suprapubic cath and bladder spasms - Foley replaced on 11/22. Consider placement of larger size  # Cerebral Palsy - Continue home antispasmodics and pain meds - Continue home lamictal  FEN/GI: soft diet, MIVF Prophylaxis: on xarelto  Disposition: Anticipated discharge today pending transition to room air  Subjective:  No acute complaints overnight. Continues to hurt with deep breathing. Would like to try to transition to room air today.  Objective: Temp:  [98 F (36.7 C)-99.1 F (37.3 C)] 98.4 F (36.9 C) (11/27 0513) Pulse Rate:  [85-87] 85 (11/27 0513) Resp:  [15] 15 (11/27 0513) BP: (103-140)/(74-123) 119/75 mmHg (11/27 0513) SpO2:  [93 %-99 %] 95 % (11/27 0513) Physical Exam: Gen: 38yo female resting comfortably HEENT: MMM, Drooling CV: S1 and S2 noted. No murmur noted. Regular rate and rhythm. Resp: Clear to auscultation bilaterally.  No wheezes noted. No increased work of breathing. On 1L [place] Abd: BS present, soft and non-distended, no tenderness to palpation Ext: No edema, spastic with multiple contractures unchanged from her previous exams Neuro: Alert and oriented, with baseline dysarthria and increased tone.   Laboratory:  Recent Labs Lab 10/22/14 1405 10/23/14 0547 10/25/14 0545  WBC 13.8* 12.5* 5.6  HGB 10.7* 9.0* 9.6*  HCT 34.7* 28.8* 30.7*  PLT 330 252 197    Recent Labs Lab 10/22/14 1405 10/23/14 0547 10/25/14 0545  NA 140 141 138  K 4.4 3.3* 3.9  CL 98 107 101  CO2 25 21 24   BUN 10 10 6   CREATININE 0.58 0.48* 0.41*  CALCIUM 9.3 7.9* 8.4  GLUCOSE 152* 131* 90   ProBNP 321.7 Trop 0.02 Lactic acid 3.33  Imaging/Diagnostic Tests Ct Angio Chest Pe W/cm &/or Wo Cm  10/25/2014   CLINICAL DATA:  Shortness of Breath ; history of previous pulmonary emboli  EXAM: CT ANGIOGRAPHY CHEST WITH CONTRAST  TECHNIQUE: Multidetector CT imaging of the chest was performed using the standard protocol during bolus administration of intravenous contrast. Multiplanar CT image reconstructions and MIPs were obtained to evaluate the vascular anatomy.  CONTRAST:  80mL OMNIPAQUE IOHEXOL 350 MG/ML SOLN  COMPARISON:  Chest radiograph October 22, 2014 and chest CT July 17, 2011  FINDINGS: There is no demonstrable pulmonary embolus. There is no thoracic aortic aneurysm or dissection.  There are bilateral pleural effusions with bibasilar lung consolidation, more on the left than on the right.  There is no appreciable thoracic adenopathy. The pericardium is not thickened. Heart is borderline enlarged.  In the visualized upper abdomen,  there are multiple gallstones within the gallbladder.  There is incomplete visualization of a catheter within the thoracic region. The distal aspect of this catheter is at the level of T9. Its more proximal aspect is not visualized on this study.  There is enlargement of the thyroid with inhomogeneous  attenuation, particularly on the left.  Review of the MIP images confirms the above findings.  IMPRESSION: No demonstrable pulmonary embolus.  Bilateral lower lobe infiltrate, more on the left than on the right. Small pleural effusions bilaterally.  Heart borderline enlarged.  Cholelithiasis.  Enlarged inhomogeneous thyroid. This finding may warrant thyroid ultrasound to further assess.   Electronically Signed   By: Bretta BangWilliam  Woodruff M.D.   On: 10/25/2014 10:02   Ct Abdomen Pelvis W Contrast  10/04/2014   CLINICAL DATA:  Initial evaluation for acute abdominal pain.  EXAM: CT ABDOMEN AND PELVIS WITH CONTRAST  TECHNIQUE: Multidetector CT imaging of the abdomen and pelvis was performed using the standard protocol following bolus administration of intravenous contrast.  CONTRAST:  100mL OMNIPAQUE IOHEXOL 300 MG/ML  SOLN  COMPARISON:  Prior study from earlier same day.  FINDINGS: Mild bibasilar atelectasis seen dependently within the lung bases, right greater than left. There is a trace right pleural effusion.  Liver demonstrates a normal contrast enhanced appearance. Subtle heterogeneous hyperdensity within the gallbladder lumen may reflect stones and/ or sludge. Common bile duct mildly dilated up to 7.8 mm. Delete that  Spleen, adrenal glands, and pancreas demonstrate a normal contrast enhanced appearance.  Simple 1.7 cm hypodense lesion within the left kidney measures intermediate density at 35 Hounsfield units, indeterminate. No nephrolithiasis or hydronephrosis.  Stomach within normal limits. No evidence of bowel obstruction. Moderate to large amount of retained stool seen within the distal colon, suggesting constipation. No abnormal wall thickening, inflammatory stranding, or abnormal enhancement seen about the bowels to suggest active inflammation.  Suprapubic catheter in place within the bladder. Gas lucency seen within the nondependent portion of the bladder lumen. Circumferential bladder wall thickening may  be related to incomplete distension, chronic outlet obstruction, or possibly acute cystitis.  Uterus is absent.  Ovaries within normal limits.  No free air or fluid. No adenopathy. Normal intravascular enhancement seen throughout the abdomen and pelvis.  Generator for baclofen pump present within the subcutaneous fat of the right anterior abdomen. The electrodes terminate at the T10 level. No acute osseous abnormality. No worrisome lytic or blastic osseous lesions. Scoliosis noted.  IMPRESSION: 1. Suprapubic catheter in place. Circumferential bladder wall thickening may be related to incomplete distension, chronic outlet obstruction, or possibly acute cystitis. Correlation with urinalysis recommended. 2. Moderate to large amount of retained stool within the rectosigmoid colon, suggesting constipation. 3. Subtle heterogeneous signal intensity within the gallbladder lumen, suggesting cholelithiasis. Common bile duct mildly prominent measuring up to 7-8 mm. No CT evidence for acute cholecystitis. 4. 1.7 cm intermediate density lesion within the left kidney. While this finding likely reflects a proteinaceous or hemorrhagic cyst, possible underlying renal malignancy is not entirely excluded. Further evaluation with renal mass protocol MRI recommended. This could be performed on a nonemergent basis. 5. Trace right pleural effusion.   Electronically Signed   By: Rise MuBenjamin  McClintock M.D.   On: 10/04/2014 02:14   Dg Chest Port 1 View  10/22/2014   CLINICAL DATA:  38 year old female with hypoxia and shortness of breath.  EXAM: PORTABLE CHEST - 1 VIEW  COMPARISON:  03/10/2014 and prior chest radiographs  FINDINGS: This is a very low volume film  with mild bibasilar atelectasis.  The cardiomediastinal silhouette is unchanged.  There is no evidence of focal airspace disease, pulmonary edema, suspicious pulmonary nodule/mass, pleural effusion, or pneumothorax. No acute bony abnormalities are identified.  IMPRESSION: Low  volume film with mild bibasilar atelectasis. No other significant abnormalities identified.   Electronically Signed   By: Laveda AbbeJeff  Hu M.D.   On: 10/22/2014 14:47   Dg Abd 2 Views  10/04/2014   CLINICAL DATA:  Abdominal pain for 4 hr.  Blood tinged urine.  EXAM: ABDOMEN - 2 VIEW  COMPARISON:  03/11/2013  FINDINGS: Electronic device with spinal catheter or stimulator. Scattered gas throughout the small and large bowel without distention. Changes most likely represent mild ileus. No radiopaque stones. Visualized bones appear intact. No free intra-abdominal air. No abnormal air-fluid levels.  IMPRESSION: Gas-filled nondistended small and large bowel most consistent with mild ileus.   Electronically Signed   By: Burman NievesWilliam  Stevens M.D.   On: 10/04/2014 00:45   Araceli Bouchealeigh N Rumley, DO 10/28/2014, 8:06 AM PGY-1, [facility] FPTS Intern pager: [phone], text pages welcome

## 2014-10-31 ENCOUNTER — Non-Acute Institutional Stay: Payer: Medicaid Other | Admitting: Family Medicine

## 2014-10-31 DIAGNOSIS — J189 Pneumonia, unspecified organism: Secondary | ICD-10-CM | POA: Diagnosis not present

## 2014-10-31 DIAGNOSIS — F99 Mental disorder, not otherwise specified: Secondary | ICD-10-CM

## 2014-10-31 DIAGNOSIS — N319 Neuromuscular dysfunction of bladder, unspecified: Secondary | ICD-10-CM

## 2014-10-31 HISTORY — DX: Mental disorder, not otherwise specified: F99

## 2014-10-31 NOTE — Assessment & Plan Note (Addendum)
Has completed treatment and is currently doing well.  Will attempt to wean O2 today.

## 2014-10-31 NOTE — Progress Notes (Signed)
Patient ID: [name] SlickerJudith Asbury, female   DOB: 11/14/1976, 38 y.o.   MRN: [id number] I have seen and examined this patient. I have discussed with Dr Tommy RainwaterJ. Cook.  I agree with their findings and plans as documented in their nursing home admission note.

## 2014-10-31 NOTE — Progress Notes (Signed)
   Subjective:    Patient ID: [name] [name], female    DOB: 12/30/1975, 38 y.o.   MRN: [id number]  HPI 38 year old female with PMH of CP, Depression, OCD, HTN, recurrent PE (on Anticoagulation), & Neurogenic bladder s/p suprapubic catheter and recurrent UTI see today for followup from recent hospital admission.  Hospital Course: Patient was admitted on 11/21 with fever, SOB/O2 requirement, and suprapubic pain.  Patient met sepsis criteria on admission.  Urinalysis was notable for positive nitrite, large leukocytes, and large Hb.  Chest xray was obtained and was negative for infiltrate.  Patient was started on Cefepime for presumed urosepsis.  SOB continued and Vanc was added for possible HCAP.    Patient continued to spike fever and had continued SOB; thus on 11/24 CTA was done and was negative for PE but revealed bilateral lower lobe infiltrate (confirming diagnosis of HCAP).  Levaquin was added at that time for double coverage of pseudomonas for HCAP.   Urine culture returned on 11/25 and revealed Pseudomonas, Serratia, and E coli (pan sensitive).   Patient subsequently did well and was transitioned to PO Levaquin and Doxycycline.  Patient did have an O2 requirement during admission and was discharged on [place] 2L.  Follow up: [name] [name] is doing well this am. She has now finished her course of Doxy and Levaquin.  No recent fever, chills.  She does not some vaginal pain this am and is concerned that she may have another UTI.  Review of Systems No fever, chills, nausea, vomiting, abdominal pain, chest pain, SOB.  No reported vaginal discharge.      Objective:   Physical Exam Vitals:   Exam: General: chronically ill appearing female,  O2 in place. NAD.  Cardiovascular: RRR. No murmurs, rubs, or gallops. Respiratory: Decreased breath sounds throughout but no adventitious sounds noted. Abdomen: soft, nontender, nondistended. Suprapubic cath in place.  Extremities: Spasticity and contractures  noted. No LE edema.  Skin: Warm, dry, intact.    Assessment & Plan:  See Problem List 

## 2014-10-31 NOTE — Assessment & Plan Note (Signed)
Catheter replaced on 11/22.  Continue to change monthly.

## 2014-10-31 NOTE — Assessment & Plan Note (Signed)
Patient has a complicated psych history including OCD, Depression, Anxiety. She is followed by [facility]. She is currently doing well on her current regimen which was recently adjusted by [facility].  Continue Abilify, Lamictal, Seroquel, Klonopin, Valium.  Continuing Zoloft taper.

## 2014-10-31 NOTE — Addendum Note (Signed)
Addended byPerley Jain: Casandra Dallaire D on: 10/31/2014 03:10 PM   Modules accepted: Level of Service

## 2014-11-02 ENCOUNTER — Encounter: Payer: Self-pay | Admitting: Family Medicine

## 2014-11-02 NOTE — Progress Notes (Signed)
Patient ID: Mindi SlickerJudith [name], female   DOB: Nov 17, 1976, 38 y.o.   MRN: [id number] I discussed with  Dr Michail JewelsMarsh.  I agree with their plans documented in their regulatory visit note.

## 2014-11-02 NOTE — Addendum Note (Signed)
Addended byPerley Jain: Donald Jacque D on: 11/02/2014 12:27 PM   Modules accepted: Level of Service

## 2014-11-08 ENCOUNTER — Other Ambulatory Visit: Payer: Self-pay | Admitting: Family Medicine

## 2014-11-08 DIAGNOSIS — G894 Chronic pain syndrome: Secondary | ICD-10-CM

## 2014-11-08 DIAGNOSIS — M1990 Unspecified osteoarthritis, unspecified site: Secondary | ICD-10-CM

## 2014-11-08 DIAGNOSIS — R252 Cramp and spasm: Secondary | ICD-10-CM

## 2014-11-08 DIAGNOSIS — G801 Spastic diplegic cerebral palsy: Secondary | ICD-10-CM

## 2014-11-08 MED ORDER — DIAZEPAM 5 MG PO TABS: 5.0000 mg | ORAL_TABLET | Freq: Four times a day (QID) | ORAL | Status: DC | PRN

## 2014-11-08 MED ORDER — HYDROCODONE-ACETAMINOPHEN 5-325 MG PO TABS: 1.0000 | ORAL_TABLET | Freq: Four times a day (QID) | ORAL | Status: DC | PRN

## 2014-11-08 MED ORDER — CLONAZEPAM 1 MG PO TABS: 1.0000 mg | ORAL_TABLET | Freq: Four times a day (QID) | ORAL | Status: DC

## 2014-11-17 ENCOUNTER — Telehealth: Payer: Self-pay

## 2014-11-17 NOTE — Telephone Encounter (Signed)
Called and CX Patient's apt for Botox we need to reschedule her apt. Patient was scheduled for 11-22-2014. When patient calls back please put her threw to [place] . Denese KillingsJanisha will know what to do and get her appt. Resheduled for Botox. Called and left her two messages I will also send her a letter.

## 2014-11-22 ENCOUNTER — Ambulatory Visit: Payer: Medicaid Other | Admitting: Neurology

## 2014-11-29 ENCOUNTER — Telehealth: Payer: Self-pay | Admitting: Family Medicine

## 2014-12-01 ENCOUNTER — Ambulatory Visit: Payer: Medicaid Other | Admitting: Neurology

## 2014-12-07 ENCOUNTER — Other Ambulatory Visit: Payer: Self-pay | Admitting: Family Medicine

## 2014-12-07 ENCOUNTER — Encounter: Payer: Self-pay | Admitting: Neurology

## 2014-12-07 ENCOUNTER — Ambulatory Visit (INDEPENDENT_AMBULATORY_CARE_PROVIDER_SITE_OTHER): Payer: Medicaid Other | Admitting: Neurology

## 2014-12-07 DIAGNOSIS — G801 Spastic diplegic cerebral palsy: Secondary | ICD-10-CM

## 2014-12-07 DIAGNOSIS — G249 Dystonia, unspecified: Secondary | ICD-10-CM

## 2014-12-07 DIAGNOSIS — M1990 Unspecified osteoarthritis, unspecified site: Secondary | ICD-10-CM

## 2014-12-07 DIAGNOSIS — G803 Athetoid cerebral palsy: Secondary | ICD-10-CM

## 2014-12-07 DIAGNOSIS — G809 Cerebral palsy, unspecified: Secondary | ICD-10-CM

## 2014-12-07 DIAGNOSIS — G894 Chronic pain syndrome: Secondary | ICD-10-CM

## 2014-12-07 MED ORDER — HYDROCODONE-ACETAMINOPHEN 5-325 MG PO TABS: 1.0000 | ORAL_TABLET | Freq: Four times a day (QID) | ORAL | Status: DC | PRN

## 2014-12-07 MED ORDER — ONABOTULINUMTOXINA 100 UNITS IJ SOLR
300.0000 [IU] | Freq: Once | INTRAMUSCULAR | Status: AC
  Administered 2014-12-07: 300 [IU] via INTRAMUSCULAR

## 2014-12-07 NOTE — Progress Notes (Signed)
HPI:   [name] [name] is a 39 year old Female with spastic and dystonic quadriparesis, depression/ anxiety, episodes where she loses contact with her world and that have been studied thoroughly and appear nonepileptic in nature, migraine headaches.  She is currently taking coumadin for her previous DVT, recurrent pulmonary emboli.  She came in for EMG guided BOTOX A injection, previously was done by Dr. Kelli Hope since Feb, 2006, received injection at [facility] prior to that.   BOTOX has always been to her right arm, where she often complains of spastic pain, her left arm even though has significant spasticity, is her functional arm.  She used her left arm to manipulate wheelchair, operating cellphone and feed herself.  She also has intrathecal baclofen pump. She used to be able to transfer herself in/out of wheelchair, living in NH now she has gradually worsening functional capacity over the past few month, especially since her recent hospital admission in August 2012. She complained of syncopy, there was associated hypoxia to 83%, CT angiogram of bran and neck were normal. EEG ws normal. abilify and klonopin dosage were decreased  She was discharged to nursing home now, she could no longer transfer herself in and out of wheelchair, she has less functional left arm.  UPDATE June 3rd 2015: Last injection was in Feb 2015, she received injection to her right arm, and left hand which has helped her a lot, there was no significant side effect noticed.  Today she complains of worsening left arm function, difficulty straight out fingers, wants more injection to her left hand, which is her functional hand   She was admitted to hospital in April the fifteenth for abdominal pain, likely related to constipations, CT showed large stool burden, she was treated with Colace, MiraLax, and enema, symptoms has resolved, she is to continue taking Kerin Salen for her pulmonary emboli  UPDATE Sept 23 2015: She had Botox  injection in June third 2015, she complains of difficulty straight out her left hand, increased difficulty using her left hand, which is her functional hand, operating wheelchair, want today's injection emphasize on her left hand.   Update January sixth 2016:: She responded very well to previous Botox injection September 20 third, she was able to use her left hand better, no significant side effect noticed, she wants to continue injection with her left arm  Physical Examinations:  Mental Status: seated in wheelchair, slumped slightly, with head band and chest vest to hold her body posture.  Cranial Nerves: round minimally reactive pupils,  conjugate eye movement, dystonic but symmetric facial strength, difficulty protruding her tongue, severe dysarthria to the point of being in unintelligible at times Motor: right arm in shoulder external rotation, pronation, elbow extended, wrist and finger flexion, maximum 90 with right wrist extension. she has uncontrollable right shoulder posterior extension.  Left arm able to have antigravity movement, but slow movement under voluntary control, stayed in left elbow pronation, flexion. The left hand was fisted. Very slow release of her left finger flexion, with passive movement, she has full range of motion of left shoulder, elbow, and left fingers. She was able to move her legs against gravity 2/5.  Sensory: Withdrawal to pain Coordination:  poor coordination Gait and Station: spastic diplegia, in wheelchair   Assessment and Plan:  39 yo Caucasian female with spastic quadriplegia, had EMG guided Botox injection for bilateral upper extremity spasticity, continue to complain left hands forceful finger flexion, thumb in position,    Used 300 units of BOTOX A, 100 units/2cc  normal saline.  Left flexor digitorum profundus 25 units Left flexor digitorum superficialis 25 Left opponents 12.5 Left hand lumricals for total of 37.5 Left pronator teres 25  units Left brachialis 25 units times 2= 50 units   Right biceps 25 Right brachialis 25 x2= 50 Right pronator teres 25 units Right palmaris longus 25 units   She tolerated the injection well, will return to clinic in 3 months for repeat injection  [name] [name], M.D. Ph.D.  [facility] [address] [place], [place] [address] Phone: [phone] Fax:      [phone]

## 2014-12-09 ENCOUNTER — Encounter: Payer: Self-pay | Admitting: Pharmacist

## 2014-12-09 DIAGNOSIS — H6011 Cellulitis of right external ear: Secondary | ICD-10-CM

## 2014-12-22 ENCOUNTER — Non-Acute Institutional Stay (INDEPENDENT_AMBULATORY_CARE_PROVIDER_SITE_OTHER): Payer: Medicaid Other | Admitting: Family Medicine

## 2014-12-22 DIAGNOSIS — G801 Spastic diplegic cerebral palsy: Secondary | ICD-10-CM

## 2014-12-22 DIAGNOSIS — L0390 Cellulitis, unspecified: Secondary | ICD-10-CM | POA: Insufficient documentation

## 2014-12-22 DIAGNOSIS — L03818 Cellulitis of other sites: Secondary | ICD-10-CM

## 2014-12-22 DIAGNOSIS — Z9359 Other cystostomy status: Secondary | ICD-10-CM

## 2014-12-22 DIAGNOSIS — G809 Cerebral palsy, unspecified: Secondary | ICD-10-CM

## 2014-12-22 DIAGNOSIS — Z9350 Unspecified cystostomy status: Secondary | ICD-10-CM

## 2014-12-22 HISTORY — DX: Cellulitis, unspecified: L03.90

## 2014-12-22 NOTE — Progress Notes (Signed)
Patient ID: Mindi SlickerJudith Kliethermes, female   DOB: May 15, 1976, 39 y.o.   MRN: [id number]  Marikay AlarEric Rosaleah Person, MD Phone: [phone]  Mindi SlickerJudith Harbaugh is a 39 y.o. female who is seen today for a nursing home visit.   Suprapubic discomfort: patient reports that her lower abdomen is uncomfortable. She notes this started yesterday. She has a suprapubic catheter. She notes she has mild dysuria. She reports she had her catheter exchanged today. She has no fevers or chills and other wise feels well. On discussion of this with nursing the patient apparently saw urology today. She was advised to discuss this with them, though evidently did not while at the visit.   CP: continues on baclofen. Recently had tizanidine decreased due to sluggishness during the day and has been doing well with this. Has follow-up with Dr Sharene SkeansHickling in March. No complaints of discomfort from her spasticity.    ROS: Per HPI   Physical Exam Filed Vitals:   12/22/14 2022  BP: 102/59  Pulse: 70  Temp: 98.3 F (36.8 C)  Resp: 18    Gen: Well NAD Lungs: CTABL Nl WOB Heart: RRR  Abd: soft, mild tenderness in suprapubic region, ND, no guarding or rebound Suprapubic catheter noted with yellowish discharge around the catheter, mild erythema noted at the insertion site, though no surrounding erythema, no noted induration Exts: Non edematous BL  LE, warm and well perfused.    Assessment/Plan: Please see individual problem list.  Marikay AlarEric Wafaa Deemer, MD [facility] PGY-3

## 2014-12-22 NOTE — Assessment & Plan Note (Addendum)
Patient with slight erythema and drainage from around the suprapubic catheter site. No systemic symptoms and VSS. Discussed exam findings with Dr Leveda AnnaHensel and decision was made to treat as a cellulitis with keflex 500 mg q 6 hr PO for 5 days with decision to extend this course based on response to medication to be made after 5 day course. Discussed this with the patient and that she would need to let nursing know if pain worsened or if she developed chills. Advised nursing to monitor for fevers or vital sign instability. I made the geri resident, Dr Casper HarrisonStreet, aware of this treatment decision so clinical response could followed up on.

## 2014-12-22 NOTE — Assessment & Plan Note (Signed)
UA and UCx ordered given dysuria and suprapubic discomfort. See cellulitis problem for antibiotic plan.

## 2014-12-23 NOTE — Assessment & Plan Note (Signed)
No complaints regarding this today. Seems to be tolerating the decrease in tizanidine well. Continue current baclofen and tizanidine doses. F/u with Dr Sharene SkeansHickling already scheduled.

## 2014-12-26 ENCOUNTER — Encounter: Payer: Self-pay | Admitting: Family Medicine

## 2014-12-26 ENCOUNTER — Non-Acute Institutional Stay (INDEPENDENT_AMBULATORY_CARE_PROVIDER_SITE_OTHER): Payer: Medicaid Other | Admitting: Family Medicine

## 2014-12-26 DIAGNOSIS — L03818 Cellulitis of other sites: Secondary | ICD-10-CM

## 2014-12-26 NOTE — Progress Notes (Signed)
Patient ID: [name] [name], female   DOB: 05-31-1976, 39 y.o.   MRN: [id number]  [facility] cellulitis diagnosed by Dr. Birdie SonsSonnenberg last week. Pt has been treated with Keflex for drainage / erythema around chronic suprapubic cath site. Pt denies abdominal pain this morning and states she doesn't think she has had any more drainage.  VS reviewed. No tachycardia, no fever. Gen: adult female, multiple contractures secondary to CP, in NAD, responsive / alert Abd: soft, nontender, suprapubic cath in place without erythema and no frank drainage Ext: warm, well-perfused   Cellulitis resolving. Complete Keflex per Dr. Purvis SheffieldSonnenberg's previous note. F/u as needed. Continue chronic cath care per urology's recommendations. Discussed with Dr. McDiarmid.  Bobbye Mortonhristopher M Vernel Donlan, MD PGY-3, [facility] 12/26/2014, 2:30 PM

## 2014-12-27 NOTE — Progress Notes (Signed)
Patient ID: [name] SlickerJudith [name], female   DOB: 11/23/1976, 39 y.o.   MRN: [id number] I have reviewed Dr Lavetta NielsenSonenberg's note.  I agree with his plan.  [name] frequently has bladder spasm pain after bladder Botox injections and Bladder catheter changes that responds usually to 2 to 3 days of increased Vesicare 10 mg daily.

## 2015-01-02 ENCOUNTER — Other Ambulatory Visit: Payer: Self-pay | Admitting: Family Medicine

## 2015-01-02 MED ORDER — SENNOSIDES-DOCUSATE SODIUM 8.6-50 MG PO TABS: 1.0000 | ORAL_TABLET | Freq: Two times a day (BID) | ORAL | Status: DC

## 2015-01-11 ENCOUNTER — Non-Acute Institutional Stay: Payer: Medicaid Other | Admitting: Family Medicine

## 2015-01-11 ENCOUNTER — Encounter: Payer: Self-pay | Admitting: Pharmacist

## 2015-01-11 DIAGNOSIS — S0991XA Unspecified injury of ear, initial encounter: Secondary | ICD-10-CM | POA: Insufficient documentation

## 2015-01-11 DIAGNOSIS — H6011 Cellulitis of right external ear: Secondary | ICD-10-CM

## 2015-01-11 NOTE — Assessment & Plan Note (Signed)
New problem Working diagnosis is mild stage cellulitis involving right ear helix and ear lobe, though this soft tissue inflammation may be from mechanical irritation for cervical spasticity and moisture from skin trapping and saliva. .  Plan Keflex 500 mg THREE TIMES DAILY for 5 days. Right ear guard  

## 2015-01-11 NOTE — Progress Notes (Signed)
Patient ID: [name] [name], female   DOB: July 06, 1976, 39 y.o.   MRN: [id number] [facility]  Visit  Primary Care Provider: Wynonia Musty  MD Location of Care: [facility] Visit Information: a scheduled routine follow-up visit Patient accompanied by patient Source(s) of information for visit: patient, nursing home and past medical records  Chief Complaint:  Chief Complaint  Patient presents with  . Regulatory visit  . Ear Pain    Nursing Concerns: Right ear pain  Nutrition Concerns: none Wound Care Nurse Concerns: none PT / OT Concerns: none  HISTORY OF PRESENT ILLNESS: Right Ear Pain Onset: Yesterday Location: right pinna and earlobe  Quality: burning Severity: mild-moderate Function: no impairment in hearing Duration: one day Pattern: continuous Course: stable Radiation: no Relief: Fo[place] Dressing Precipitant: none recalled.  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  Outpatient Encounter Prescriptions as of 12/09/2014  Medication Sig  . acetaminophen (TYLENOL) 325 MG tablet Take 650 mg by mouth every 8 (eight) hours.   [name] Kitchen acetic acid (VOSOL) 2 % otic solution Place 4 drops into both ears once a week.  . ARIPIPRAZOLE PO Take 1 mg by mouth daily.  . bacitracin-polymyxin b (POLYSPORIN) ointment Apply 1 application topically 2 (two) times daily as needed (Redness around cath site).  . baclofen (LIORESAL) 10 MG tablet Take 20 mg by mouth 4 (four) times daily.   . cetirizine (ZYRTEC) 10 MG tablet Take 5 mg by mouth daily.  . clonazePAM (KLONOPIN) 1 MG tablet Take 1 tablet (1 mg total) by mouth 4 (four) times daily.  . diazepam (VALIUM) 5 MG tablet Take 1 tablet (5 mg  total) by mouth every 6 (six) hours as needed for muscle spasms.  [name] Kitchen FLUoxetine (PROZAC) 20 MG tablet Take 20 mg by mouth daily.  [name] Kitchen HYDROcodone-acetaminophen (NORCO/VICODIN) 5-325 MG per tablet Take 1 tablet by mouth every 6 (six) hours as needed (pain in both knees).  [name] Kitchen ibuprofen (ADVIL,MOTRIN) 400 MG tablet Take 400 mg by mouth every 8 (eight) hours as needed for mild pain.  [name] Kitchen lamoTRIgine (LAMICTAL) 200 MG tablet Take 200 mg by mouth daily.  [name] Kitchen lidocaine (LIDODERM) 5 % Place 1 patch onto the skin daily. Remove & Discard patch within 12 hours or as directed by MD  . Multiple Vitamin (MULTIVITAMIN) tablet Take 1 tablet by mouth daily.  . polyethylene glycol (MIRALAX / GLYCOLAX) packet Take 17 g by mouth 2 (two) times daily.   . QUEtiapine (SEROQUEL) 50 MG tablet Take 50 mg by mouth at bedtime.  . rivaroxaban (XARELTO) 20 MG TABS tablet Take 20 mg by mouth daily with supper.  . solifenacin (VESICARE) 5 MG tablet Take 1 tablet (5 mg total) by mouth daily. (Patient not taking: Reported on 12/23/2014)  . SUMAtriptan (IMITREX) 100 MG tablet Take 100 mg by mouth every 2 (two) hours as needed for migraine or headache. May repeat in 2 hours if headache persists or recurs.  [name] Kitchen tiZANidine (ZANAFLEX) 4 MG tablet Take 4 mg by mouth 4 (four) times daily.   . [DISCONTINUED] docusate sodium (COLACE) 100 MG capsule Take 100 mg by mouth 2 (two) times daily.  . [DISCONTINUED] sertraline (ZOLOFT) 50 MG tablet Take 50 mg by mouth daily. Start 10/20/14: 2 tab daily  x 7 day, then 1 tab daily x 7 days, then 1/2 tab daily x 14 days, then stop Zoloft, start Fluoxetine 20 mg daily.   Allergies  Allergen Reactions  . Morphine Dermatitis    Skin turned red   History Patient Active Problem List   Diagnosis Date Noted  . Cellulitis 12/22/2014  . Psychiatric illness 10/31/2014  . HCAP (healthcare-associated pneumonia) 10/25/2014  . Parapneumonic effusion 10/25/2014  . Pressure ulcer stage II 10/24/2014  . Acute  respiratory failure with hypoxia   . Constipation, chronic 10/10/2014  . Athetoid cerebral palsy 04/13/2014  . Dystonia 04/12/2014  . Constipation 03/16/2014  . Presence of intrathecal baclofen pump 03/07/2014  . Spasticity 01/05/2014  . Nursing home resident 09/16/2013  . Dental caries 07/18/2013  . Multiple thyroid nodules 05/20/2013  . Osteoarthrosis 04/07/2013  . Anemia 04/07/2013  . [name]   . Dysarthria   . GERD (gastroesophageal reflux disease)   . Gross hematuria 03/12/2013  . Neurogenic bladder 02/05/2013  . Anxiety state 10/21/2012  . Healed or old pulmonary embolism 10/21/2012  . History of anticoagulant therapy 10/21/2012  . Chronic pain syndrome 06/17/2012  . Abdominal pain 01/02/2012  . Intracervical pessary 05/07/2011  . Recurrent pulmonary embolism 05/03/2011  . Chronic suprapubic catheter 03/15/2011  . Dyspnea and respiratory abnormality 02/04/2011  . Seborrheic keratosis 01/18/2011  . Long term (current) use of anticoagulants 01/12/2011  . Disease of female genital organs 10/24/2010  . HYPERTENSION, BENIGN 01/31/2010  . CP (cerebral palsy), spastic 12/27/2008  . Contracted, joint, multiple sites 12/27/2008  . Major depressive disorder, recurrent episode 01/29/2007  . Obsessive-compulsive disorder 01/29/2007  . Infantile cerebral palsy 01/29/2007  . Anancastic neurosis 01/29/2007   Past Medical History  Diagnosis Date  . Cerebral palsy   . Pulmonary embolism     Lifetime Coumadin  . Cerebral palsy     Spastic Cerebral palsy, mentally intact  . Contracture, joint, multiple sites     Electric wheelchair, uses left hand to operate chair.   [name] Kitchen. Hypertension   . OCD (obsessive compulsive disorder)   . Depression   . Migraine   . [name]   . Dysarthria   . Endometriosis   . History of recurrent UTIs   . GERD (gastroesophageal reflux disease)   . Pulmonary embolism 2011, 01/2011    Will be on lifetime  coumadin  . Anemia   . Abdominal pain 01/02/2012    Overview:  Overview:  Per Previous pcp note- Dr. Ta--Pt has history of endometriosis and had DUB in 2011.  She She had u/s that showed normal endometrial stripe.  She had endometrial bx that was wnl.  She has a lot of abd cramping every month.  This cramping was sometimes not relieved with hydrocodone.  [facility] I[facility] to help endometriosis and abd cramping.  [name]  . Macroscopic hematuria 03/10/2013    status post replacement of suprapubic tube 03/04/2013   . HYPERTENSION, BENIGN 01/31/2010    Qualifier: Diagnosis of  By: Gala RomneyBensimhon, MD, Trixie DredgeFACC, Daniel R   . History of endometriosis 10/2012    OBGYN [facility]: Laparoscopic Endometrial Ablation, TVH,  . Gram positive sepsis 10/24/2014  . HCAP (healthcare-associated pneumonia) 10/25/2014   Past Surgical History  Procedure Laterality Date  . Colposcopy  06/2000  . Cesarean section      x 2  . Tubal ligation  2003  . Carpal tunnel release  08/2008  Dr Teressa Senter  . Wrist surgery  06/2010    Dr Dierdre Searles, hand surgeon, [name]  . Baclofen pump refill      x 3 times  . Appendectomy    . Intrauterine device insertion  04/30/11    Inserted by [facility] for endometriosis  . Laparoscopically assisted vag hysterectomy  10/27/2012  . Multiple extractions with alveoloplasty Bilateral 07/19/2013    Procedure: EXTRACTIONS #4, 0,98,11,91;  Surgeon: Francene Finders, DDS;  Location: [facility] OR;  Service: Oral Surgery;  Laterality: Bilateral;  . Pain pump implantation N/A 03/07/2014    Procedure: baclofen pump revision/replacement and Catheter connection replacement;  Surgeon: Cristi Loron, MD;  Location: [facility] NEURO ORS;  Service: Neurosurgery;  Laterality: N/A;  baclofen pump revision/replacement and Catheter connection replacement  . Programable baclofen pump revision  03/17/14    Battery Replacement    Family History  Problem Relation Age of Onset  . Asthma Father   . Colon cancer  Maternal Grandmother     Died in her 29's  . Cancer Paternal Grandmother   . Breast cancer Paternal Grandmother     Died in her 80's  . Heart attack Maternal Grandfather     Died in his 28's  . Alzheimer's disease Paternal Grandfather     Died in his 79's    reports that she has never smoked. She has never used smokeless tobacco. She reports that she drinks about 0.6 oz of alcohol per week. She reports that she does not use illicit drugs.  Basic Activities of Daily Living   ADLs Independent Needs Assistance Dependent  Bathing   x  Dressing   x  Ambulation  x   Toileting   x  Eating x         Falls in the past six months:   no  Diet:  general Supplemental shakes:  no     General: Denies fevers, chills,   Ears/Nose/Throat: (+) ear pain, Denies rhinorrhea      PHYSICAL EXAM:. Wt Readings from Last 3 Encounters:  10/22/14 135 lb 14.4 oz (61.644 kg)  08/22/14 132 lb (59.875 kg)  08/16/14 132 lb (59.875 kg)   Temp Readings from Last 3 Encounters:  12/22/14 98.3 F (36.8 C)   10/28/14 98.4 F (36.9 C) Oral  10/04/14 98.9 F (37.2 C) Oral   BP Readings from Last 3 Encounters:  12/22/14 102/59  10/28/14 119/75  10/04/14 104/60   Pulse Readings from Last 3 Encounters:  12/22/14 70  10/28/14 85  10/04/14 75    General: alert, no distress, lying in bed with right ear towards wall.  HEENT:  Moist neck from saliva. Right Pinna with erythema and mild edema of inferior helix and earlobe. Right EAC without erythema or edema, TMs clear bilateral after cleaning with cerumen currette, Oromucosa moist and no erythema or lesion Neck:  Supple, No JVD, no lymphadenopathy CV:  RRR, no murmur RESP: No resp distress or accessory muscle use.  Clear to ausc bilat. No wheezing, no rales, no rhonchi.   Neurologic:  Muscle Tone spastic; Motor Strength: weakness of  both arm(s): entire limb  Psych:  Orientation oriented to person, place, time, and general circumstances; Judgment  Good, Insight Intact Memory recent and remote memory intact; Attention Normal;  Speech dysarthric;  Thought Coherent and Relevant    No flowsheet data found.   Assessment and Plan:   See Problem List for individual problem's assessment and plans.      Advanced Directives (  MOST form, Living Will, HCPOA):  Code Status:   Full Code  Emergency contact:  Degante,[name] Father [phone]     Follow Up:  Next 60 days unless acute issues arise.

## 2015-01-11 NOTE — Progress Notes (Signed)
Patient ID: [name] [name], female   DOB: 11/22/1976, 38 y.o.   MRN: [id number] [facility]  Visit  Primary Care Provider: Sonnenberg, E  MD Location of Care: [facility] Visit Information: a scheduled routine follow-up visit Patient accompanied by patient Source(s) of information for visit: patient, nursing home and past medical records  Chief Complaint:  Chief Complaint  Patient presents with  . Regulatory visit  . Ear Pain    Nursing Concerns: Right ear pain  Nutrition Concerns: none Wound Care Nurse Concerns: none PT / OT Concerns: none  HISTORY OF PRESENT ILLNESS: Right Ear Pain Onset: Yesterday Location: right pinna and earlobe  Quality: burning Severity: mild-moderate Function: no impairment in hearing Duration: one day Pattern: continuous Course: stable Radiation: no Relief: [place] Dressing Precipitant: none recalled.  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  Outpatient Encounter Prescriptions as of 12/09/2014  Medication Sig  . acetaminophen (TYLENOL) 325 MG tablet Take 650 mg by mouth every 8 (eight) hours.   . acetic acid (VOSOL) 2 % otic solution Place 4 drops into both ears once a week.  . ARIPIPRAZOLE PO Take 1 mg by mouth daily.  . bacitracin-polymyxin b (POLYSPORIN) ointment Apply 1 application topically 2 (two) times daily as needed (Redness around cath site).  . baclofen (LIORESAL) 10 MG tablet Take 20 mg by mouth 4 (four) times daily.   . cetirizine (ZYRTEC) 10 MG tablet Take 5 mg by mouth daily.  . clonazePAM (KLONOPIN) 1 MG tablet Take 1 tablet (1 mg total) by mouth 4 (four) times daily.  . diazepam (VALIUM) 5 MG tablet Take 1 tablet (5 mg  total) by mouth every 6 (six) hours as needed for muscle spasms.  . FLUoxetine (PROZAC) 20 MG tablet Take 20 mg by mouth daily.  . HYDROcodone-acetaminophen (NORCO/VICODIN) 5-325 MG per tablet Take 1 tablet by mouth every 6 (six) hours as needed (pain in both knees).  . ibuprofen (ADVIL,MOTRIN) 400 MG tablet Take 400 mg by mouth every 8 (eight) hours as needed for mild pain.  . lamoTRIgine (LAMICTAL) 200 MG tablet Take 200 mg by mouth daily.  . lidocaine (LIDODERM) 5 % Place 1 patch onto the skin daily. Remove & Discard patch within 12 hours or as directed by MD  . Multiple Vitamin (MULTIVITAMIN) tablet Take 1 tablet by mouth daily.  . polyethylene glycol (MIRALAX / GLYCOLAX) packet Take 17 g by mouth 2 (two) times daily.   . QUEtiapine (SEROQUEL) 50 MG tablet Take 50 mg by mouth at bedtime.  . rivaroxaban (XARELTO) 20 MG TABS tablet Take 20 mg by mouth daily with supper.  . solifenacin (VESICARE) 5 MG tablet Take 1 tablet (5 mg total) by mouth daily. (Patient not taking: Reported on 12/23/2014)  . SUMAtriptan (IMITREX) 100 MG tablet Take 100 mg by mouth every 2 (two) hours as needed for migraine or headache. May repeat in 2 hours if headache persists or recurs.  . tiZANidine (ZANAFLEX) 4 MG tablet Take 4 mg by mouth 4 (four) times daily.   . [DISCONTINUED] docusate sodium (COLACE) 100 MG capsule Take 100 mg by mouth 2 (two) times daily.  . [DISCONTINUED] sertraline (ZOLOFT) 50 MG tablet Take 50 mg by mouth daily. Start 10/20/14: 2 tab daily   x 7 day, then 1 tab daily x 7 days, then 1/2 tab daily x 14 days, then stop Zoloft, start Fluoxetine 20 mg daily.   Allergies  Allergen Reactions  . Morphine Dermatitis    Skin turned red   History Patient Active Problem List   Diagnosis Date Noted  . Cellulitis 12/22/2014  . Psychiatric illness 10/31/2014  . HCAP (healthcare-associated pneumonia) 10/25/2014  . Parapneumonic effusion 10/25/2014  . Pressure ulcer stage II 10/24/2014  . Acute  respiratory failure with hypoxia   . Constipation, chronic 10/10/2014  . Athetoid cerebral palsy 04/13/2014  . Dystonia 04/12/2014  . Constipation 03/16/2014  . Presence of intrathecal baclofen pump 03/07/2014  . Spasticity 01/05/2014  . Nursing home resident 09/16/2013  . Dental caries 07/18/2013  . Multiple thyroid nodules 05/20/2013  . Osteoarthrosis 04/07/2013  . Anemia 04/07/2013  . [name]   . Dysarthria   . GERD (gastroesophageal reflux disease)   . Gross hematuria 03/12/2013  . Neurogenic bladder 02/05/2013  . Anxiety state 10/21/2012  . Healed or old pulmonary embolism 10/21/2012  . History of anticoagulant therapy 10/21/2012  . Chronic pain syndrome 06/17/2012  . Abdominal pain 01/02/2012  . Intracervical pessary 05/07/2011  . Recurrent pulmonary embolism 05/03/2011  . Chronic suprapubic catheter 03/15/2011  . Dyspnea and respiratory abnormality 02/04/2011  . Seborrheic keratosis 01/18/2011  . Long term (current) use of anticoagulants 01/12/2011  . Disease of female genital organs 10/24/2010  . HYPERTENSION, BENIGN 01/31/2010  . CP (cerebral palsy), spastic 12/27/2008  . Contracted, joint, multiple sites 12/27/2008  . Major depressive disorder, recurrent episode 01/29/2007  . Obsessive-compulsive disorder 01/29/2007  . Infantile cerebral palsy 01/29/2007  . Anancastic neurosis 01/29/2007   Past Medical History  Diagnosis Date  . Cerebral palsy   . Pulmonary embolism     Lifetime Coumadin  . Cerebral palsy     Spastic Cerebral palsy, mentally intact  . Contracture, joint, multiple sites     Electric wheelchair, uses left hand to operate chair.   . Hypertension   . OCD (obsessive compulsive disorder)   . Depression   . Migraine   . [name]   . Dysarthria   . Endometriosis   . History of recurrent UTIs   . GERD (gastroesophageal reflux disease)   . Pulmonary embolism 2011, 01/2011    Will be on lifetime  coumadin  . Anemia   . Abdominal pain 01/02/2012    Overview:  Overview:  Per Previous pcp note- Dr. Ta--Pt has history of endometriosis and had DUB in 2011.  She She had u/s that showed normal endometrial stripe.  She had endometrial bx that was wnl.  She has a lot of abd cramping every month.  This cramping was sometimes not relieved with hydrocodone.  She was referred to [facility] for IUD placement to help endometriosis and abd cramping.  [name]  . Macroscopic hematuria 03/10/2013    status post replacement of suprapubic tube 03/04/2013   . HYPERTENSION, BENIGN 01/31/2010    Qualifier: Diagnosis of  By: Bensimhon, MD, FACC, Daniel R   . History of endometriosis 10/2012    OBGYN [facility]: Laparoscopic Endometrial Ablation, TVH,  . Gram positive sepsis 10/24/2014  . HCAP (healthcare-associated pneumonia) 10/25/2014   Past Surgical History  Procedure Laterality Date  . Colposcopy  06/2000  . Cesarean section      x 2  . Tubal ligation  2003  . Carpal tunnel release  08/2008      Dr Sypher  . Wrist surgery  06/2010    Dr Li, hand surgeon, [facility]  . Baclofen pump refill      x 3 times  . Appendectomy    . Intrauterine device insertion  04/30/11    Inserted by [facility] for endometriosis  . Laparoscopically assisted vag hysterectomy  10/27/2012  . Multiple extractions with alveoloplasty Bilateral 07/19/2013    Procedure: EXTRACTIONS #4, 5,11,16,19;  Surgeon: Christopher L Waurika, DDS;  Location: [facility] OR;  Service: Oral Surgery;  Laterality: Bilateral;  . Pain pump implantation N/A 03/07/2014    Procedure: baclofen pump revision/replacement and Catheter connection replacement;  Surgeon: Jeffrey D Jenkins, MD;  Location: [facility] OR[facility];  Service: Neurosurgery;  Laterality: N/A;  baclofen pump revision/replacement and Catheter connection replacement  . Programable baclofen pump revision  03/17/14    Battery Replacement    Family History  Problem Relation Age of Onset  . Asthma Father   . Colon cancer  Maternal Grandmother     Died in her 60's  . Cancer Paternal Grandmother   . Breast cancer Paternal Grandmother     Died in her 70's  . Heart attack Maternal Grandfather     Died in his 70's  . Alzheimer's disease Paternal Grandfather     Died in his 80's    reports that she has never smoked. She has never used smokeless tobacco. She reports that she drinks about 0.6 oz of alcohol per week. She reports that she does not use illicit drugs.  Basic Activities of Daily Living   ADLs Independent Needs Assistance Dependent  Bathing   x  Dressing   x  Ambulation  x   Toileting   x  Eating x         Falls in the past six months:   no  Diet:  general Supplemental shakes:  no     General: Denies fevers, chills,   Ears/Nose/Throat: (+) ear pain, Denies rhinorrhea      PHYSICAL EXAM:. Wt Readings from Last 3 Encounters:  10/22/14 135 lb 14.4 oz (61.644 kg)  08/22/14 132 lb (59.875 kg)  08/16/14 132 lb (59.875 kg)   Temp Readings from Last 3 Encounters:  12/22/14 98.3 F (36.8 C)   10/28/14 98.4 F (36.9 C) Oral  10/04/14 98.9 F (37.2 C) Oral   BP Readings from Last 3 Encounters:  12/22/14 102/59  10/28/14 119/75  10/04/14 104/60   Pulse Readings from Last 3 Encounters:  12/22/14 70  10/28/14 85  10/04/14 75    General: alert, no distress, lying in bed with right ear towards wall.  HEENT:  Moist neck from saliva. Right Pinna with erythema and mild edema of inferior helix and earlobe. Right EAC without erythema or edema, TMs clear bilateral after cleaning with cerumen currette, Oromucosa moist and no erythema or lesion Neck:  Supple, No JVD, no lymphadenopathy CV:  RRR, no murmur RESP: No resp distress or accessory muscle use.  Clear to ausc bilat. No wheezing, no rales, no rhonchi.   Neurologic:  Muscle Tone spastic; Motor Strength: weakness of  both arm(s): entire limb  Psych:  Orientation oriented to person, place, time, and general circumstances; Judgment  Good, Insight Intact Memory recent and remote memory intact; Attention Normal;  Speech dysarthric;  Thought Coherent and Relevant    No flowsheet data found.   Assessment and Plan:   See Problem List for individual problem's assessment and plans.      Advanced Directives (  MOST form, Living Will, HCPOA):  Code Status:   Full Code  Emergency contact:  Burditt,[name] Father [phone]     Follow Up:  Next 60 days unless acute issues arise.      

## 2015-01-11 NOTE — Assessment & Plan Note (Signed)
New problem Working diagnosis is mild stage cellulitis involving right ear helix and ear lobe, though this soft tissue inflammation may be from mechanical irritation for cervical spasticity and moisture from skin trapping and saliva. .  Plan Keflex 500 mg THREE TIMES DAILY for 5 days. Right ear guard

## 2015-01-12 ENCOUNTER — Encounter: Payer: Self-pay | Admitting: Pharmacist

## 2015-01-18 ENCOUNTER — Non-Acute Institutional Stay: Payer: Medicaid Other | Admitting: Family Medicine

## 2015-01-18 DIAGNOSIS — S0991XD Unspecified injury of ear, subsequent encounter: Secondary | ICD-10-CM

## 2015-01-18 NOTE — Progress Notes (Signed)
Patient ID: [name] SlickerJudith [name], female   DOB: 09/20/1976, 39 y.o.   MRN: [id number]  [name] AlarEric Nataly Pacifico, MD Phone: [phone]  [name] SlickerJudith Reitman is a 39 y.o. female who is seen today for f/u of right ear pain.  Right ear swelling: patient with previous complaint of right ear swelling noted on 2/10. She was treated for cellulitis with keflex for 5 days. She notes no improvement in this currently. The area is still tender. HL has no ear guards so this was not placed. Pain is in pinna and ear lobe with swelling.   ROS: Per HPI   Physical Exam Filed Vitals:   01/18/15 1410  BP: 128/87  Pulse: 101  Temp: 98 F (36.7 C)  Resp: 19    Gen: Well NAD, lying in bed, head turned to the right HEENT: saliva noted around neck, right pinna with erythema and mild edema noted in this area, no streaking or apparent spreading of erythema, MMM Lungs: Nl WOB  Assessment/Plan: Please see individual problem list.  [name] AlarEric Beyonka Pitney, MD [facility] PGY-3

## 2015-01-18 NOTE — Assessment & Plan Note (Signed)
No improvement after keflex, working diagnosis at this time is contact irritation from saliva and moisture trapping with also possible irritation from head band from wheel chair. Order written for barrier cream. OT to eval for padding of wheel chair head band. Will continue to monitor.

## 2015-01-20 NOTE — Progress Notes (Signed)
Patient ID: [name] [name], female   DOB: Jul 24, 1976, 39 y.o.   MRN: [id number] I have seen and examined this patient with Dr Birdie SonsSonnenberg. I have discussed with him [name] [name] case and plans.  I agree with their findings and plans as documented in his acute visit note.  Agree that right Pinna mild inflammation likely due to mechanical / chemical irriation from WC head band and saliva tracking to pinna. No ear guard was available in NH. Agree with topical barrier cream to right pinna to protect from saliva chemical irritation.  Agree with consultation with OT for evaluation and adjustment of patient's wheelchair head positioning band.

## 2015-01-30 ENCOUNTER — Telehealth: Payer: Self-pay

## 2015-01-30 NOTE — Telephone Encounter (Signed)
Patient called wanted to know when her Botox apt. Would be. Stated to Patient every three months last injection January 2016. We would be calling to schedule her apt. After insurance approval. Patient understood.

## 2015-02-03 ENCOUNTER — Encounter: Payer: Self-pay | Admitting: Pediatrics

## 2015-02-03 ENCOUNTER — Ambulatory Visit (INDEPENDENT_AMBULATORY_CARE_PROVIDER_SITE_OTHER): Payer: Medicaid Other | Admitting: Pediatrics

## 2015-02-03 DIAGNOSIS — G803 Athetoid cerebral palsy: Secondary | ICD-10-CM

## 2015-02-03 DIAGNOSIS — Z978 Presence of other specified devices: Secondary | ICD-10-CM

## 2015-02-03 DIAGNOSIS — Z9889 Other specified postprocedural states: Secondary | ICD-10-CM

## 2015-02-03 NOTE — Progress Notes (Signed)
Patient: [name] [name] MRN: [id number] Sex: female DOB: Apr 20, 1976  Provider: Deetta Perla, MD Location of Care: [facility] - Inpatient Child Neurology  Note type: Routine return visit  History of Present Illness: Referral Source: Dr. Shawna Orleans March History from: patient and [facility] chart Chief Complaint: Baclofen Pump Refill  [name] [name] is a 39 y.o. female who was evaluated and treated on February 03, 2015, for the first time since August 22, 2014.  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  She is here for emptying, refilling, and reprogramming her intrathecal baclofen pump.  Since her last visit, there have been no significant changes in her medical condition.  She is unable to walk, she is wheelchair bound.  She is dependent on others for most of her care, although she is able to feed herself.  She needs help with dressing and hygiene.  She can manipulate her motorized wheelchair.  She has a multiplicity of other problems including pulmonary embolus requiring anticoagulation, history of depression and anxiety, obsessive-compulsive disorder.  She had her pump replaced on March 07, 2014, by Dr. Tressie Stalker.  That is working well.  She is followed by Dr. Levert Feinstein for Botox, which is administered to her forearms in an attempt to keep them more mobile.  This was last performed on December 07, 2014.  This was the last time that she was hospitalized as best I know.  Her pump was interrogated, emptied, and refilled as described below.  Procedure: emptying, refilling, and reprogramming her intrathecal baclofen pump.  The device was interrogated and showed a complex continuous infusion of baclofen that has a basal rate of 14.8. mcg per hour. The patient receives bolus doses of 27 mcg delivered at midnight, 6 AM, 12 noon, and 6 PM, for total of 449.7 mcg per day. Her reservoir alarm date is July 21, 2015, or 168 days from now. ERI 70 months.  The patient was sterilely  prepped and draped. A 1-1/2 inch 22-gauge noncoring Huebner needle was inserted after several passes. With the patient's permission a telephone picture was made of her insertion location to help with the next procedure. 3.5 mL was withdrawn from the pump placing it under partial vacuum. 40 mL of baclofen (concentration 2000 mcg/mL) was instilled into the pump through a Millipore filter. She tolerated the procedure well.  Review of Systems: 12 system review was unremarkable  Past Medical History Diagnosis Date  . Cerebral palsy   . Pulmonary embolism     Lifetime Coumadin  . Cerebral palsy     Spastic Cerebral palsy, mentally intact  . Contracture, joint, multiple sites     Electric wheelchair, uses left hand to operate chair.   [name] Kitchen Hypertension   . OCD (obsessive compulsive disorder)   . Depression   . Migraine   . [name]   . Dysarthria   . Endometriosis   . History of recurrent UTIs   . GERD (gastroesophageal reflux disease)   . Pulmonary embolism 2011, 01/2011    Will be on lifetime coumadin  . Anemia   . Abdominal pain 01/02/2012    Overview:  Overview:  Per Previous pcp note- Dr. Ta--Pt has history of endometriosis and had DUB in 2011.  She She had u/s that showed normal endometrial stripe.  She had endometrial bx that was wnl.  She has a lot of abd cramping every month.  This cramping was sometimes not relieved with hydrocodone.  She was referred to [facility] for IUD  placement to help endometriosis and abd cramping.  [name]  . Macroscopic hematuria 03/10/2013    status post replacement of suprapubic tube 03/04/2013   . HYPERTENSION, BENIGN 01/31/2010    Qualifier: Diagnosis of  By: [name], MD, [name] History of endometriosis 10/2012    OBGYN [facility]: Laparoscopic Endometrial Ablation, TVH,  . Gram positive sepsis 10/24/2014  . HCAP (healthcare-associated pneumonia) 10/25/2014  . Abdominal pain 01/02/2012    Overview:  Overview:  Per Previous pcp  note- Dr. Ta--Pt has history of endometriosis and had DUB in 2011.  She She had u/s that showed normal endometrial stripe.  She had endometrial bx that was wnl.  She has a lot of abd cramping every month.  This cramping was sometimes not relieved with hydrocodone.  She was referred to [facility] for IUD placement to help endometriosis and abd cramping.  [name] was placed 04/30/11 by [facility].  Pt still complains of abd cramping, which I am treating with hydrocodone and ultram. Pt reports some initial relief of pain from IUD, but states it has now returned back to similar to previous cramping. Gave patient a shot of Toradol 60 mg IM today-since positive exacerbation of abdominal cramping during past 2 weeks. Physical exam reassuring. Patient may need to return to [facility] for further workup since she is plugged in with the GYN providers there for further treatment options regarding endometriosis and abdominal cramping. The dysfunctional uterine bleeding now well contr  . Acute respiratory failure with hypoxia   . Cellulitis 12/22/2014  . Disease of female genital organs 10/24/2010    Overview:  Overview:  Pt has history of endometriosis and had DUB in 2011.  She had u/s that showed normal endometrial stripe.  She had endometrial bx that was wnl.  She has a lot of abd cramping every month.  This cramping was sometimes not relieved with hydrocodone.  She was referred to [facility] for IUD placement to help endometriosis and abd cramping.  [name] was placed 04/30/11 by [facility].  Pt still complains of abd cramping, which was treated with hydrocodone and ultram.   Last seen at [facility] by OB/GYN 12/24/11, given Depo Lupron injection.  Ordering CT scan to r/o additional cause of abdominal pain (pt with known endometriosis and will not tolerate vaginal U/S, pain not improved s/p [name] or with Lupron injection-- only caused hot flashes and amenorrhea, no pain relief).  Also made referral to pain clinic for better pain  control.  Do not think hysterectomy would help with pain, and concern for contamination of baclofen pump if did proceed with surgery.    8/5/13[name]: Pt was seen by   . Dyspnea and respiratory abnormality 02/04/2011    Overview:  Overview:  Pt has history of recurrent PE and is on chronic coumadin.  Pt has been complaining of dyspnea that she describes as chest tightness.  She has been to the ER multiple times for this.  Usually she gets CTA and serial CE.  She has had at least 6 CTA in a period of since 2011.  She also has an O2 requirement. It is unclear why pt feels dyspneic, complains of chest tightness, or has O2 requirement.  I thought that her complaint may be from deconditioning and referred her to PT.  Unfortunately Medicaid only pays for 4 PT visits.  Pt should be doing these exercises at home, but she has not.  I have referred her to [facility], and she has  been seen by [name].  He did not understand the etiology of her dyspnea and O2 requirement.  He will continue to follow her.   Last Assessment & Plan:  Pt presents with concerns for chest tightness and dyspnea.  She was seen in ED yesterday and discharged home.  While there she had a CT chest 04/20/11 showed 1.  Multifocal degradation as detailed above.  N  . Gross hematuria 03/12/2013    Overview:  Overview:  status post replacement of suprapubic tube 03/04/2013  Last Assessment & Plan:  Recent urine culture collected in ED on 5/4 did not result in significant growth. Moreover, patient has been asymptomatic with normal WBC, no fever and no true symptoms making symptomatic UTI less likely.  Will continue to monitor for any sign of infection.  Patient's xarelto had been held during her recent hospitalization due to hematuria. This has since been re-started. Monitor bleeding.    [name] Kitchen History of anticoagulant therapy 10/21/2012  . Infantile cerebral palsy 01/29/2007    Overview:  She has spastic CP.  She is mentally intact.  She is wheelchair  bound.  She is wheelchair bound for ambulation      . Intracervical pessary 05/07/2011    Overview:  Overview:  Placed by [facility] 04/30/11 to treat DUB and Endometriosis.  She is seen at GYN clinic at [facility] but was considered a poor surgical candidate and referred her to [facility].  For DUB she had pelvic ultrasound that showed thin stripe and she had endometrial Bx by Dr Jennette Kettle in GYN clinic, which was negative.     . Parapneumonic effusion 10/25/2014  . Presence of intrathecal baclofen pump 03/07/2014    R LQ. Insertion T10.    [name] Kitchen Psychiatric illness 10/31/2014  . Recurrent pulmonary embolism 05/03/2011    Pt has history of recurrent PE and is on chronic coumadin.  Pt has been complaining of dyspnea that she describes as chest tightness.  She has been to the ER multiple times for this.  Usually she gets CTA and serial CE.  She has had at least 6 CTA in a period of since 2011.  She also has an O2 requirement. It is unclear why pt feels dyspneic, complains of chest tightness, or has O2 requirement.  I thought that her complaint may be from deconditioning and referred her to PT.  Unfortunately Medicaid only pays for 4 PT visits.  Pt should be doing these exercises at home, but she has not.  I have referred her to [facility], and she has been seen by [name].  He did not understand the etiology of her dyspnea and O2 requirement.  He will continue to follow her.     . Seborrheic keratosis 01/18/2011  . Spasticity 01/05/2014   Hospitalizations: No., Head Injury: No., Nervous System Infections: No., Immunizations up to date: Yes.    Behavior History anxiety, depression  Surgical History Procedure Laterality Date  . Colposcopy  06/2000  . Cesarean section      x 2  . Tubal ligation  2003  . Carpal tunnel release  08/2008    Dr Teressa Senter  . Wrist surgery  06/2010    Dr Dierdre Searles, hand surgeon, [name]  . Baclofen pump refill      x 3 times  . Appendectomy    . Intrauterine device insertion  04/30/11    Inserted  by [facility] for endometriosis  . Laparoscopically assisted vag hysterectomy  10/27/2012  . Multiple  extractions with alveoloplasty Bilateral 07/19/2013    Procedure: EXTRACTIONS #4, 1,61,09,60;  Surgeon: Francene Finders, DDS;  Location: [facility] OR;  Service: Oral Surgery;  Laterality: Bilateral;  . Pain pump implantation N/A 03/07/2014    Procedure: baclofen pump revision/replacement and Catheter connection replacement;  Surgeon: Cristi Loron, MD;  Location: [facility] OR[facility];  Service: Neurosurgery;  Laterality: N/A;  baclofen pump revision/replacement and Catheter connection replacement  . Programable baclofen pump revision  03/17/14    Battery Replacement    Family History family history includes Alzheimer's disease in her paternal grandfather; Asthma in her father; Breast cancer in her paternal grandmother; Cancer in her paternal grandmother; Colon cancer in her maternal grandmother; Heart attack in her maternal grandfather. Family history is negative for migraines, seizures, intellectual disabilities, blindness, deafness, birth defects, chromosomal disorder, or autism.  Social History . Marital Status: Divorced    Spouse Name: N/A  . Number of Children: 2  . Years of Education: college   Occupational History  . UNEMPLOYED    Social History Main Topics  . Smoking status: Never Smoker   . Smokeless tobacco: Never Used  . Alcohol Use: 0.6 oz/week    1 Glasses of wine per week     Comment: Drinks alcohol once a month.  . Drug Use: No  . Sexual Activity: No   Social History Narrative   Ailene Ards, who has schizophrenia, MR, is father of daughter.  Homero Fellers and pt are no longer together.  Homero Fellers does not see [name]. Daughter is 39 yo, Stage manager, lives with pt.        [name] [phone]), lives with pt's parents.      Needs assistance with ADL's and IADL's--has personal care services 20 hrs/wk;       No tobacco, EtOH, drugs.      **Patient very concerned about her kids being taken  from her.      **Case Manager: Jack Quarto [phone]      Patient lives [employer] junior [employer] Occupation: N/A Living with [employer]  Hobbies/Interest: Enjoys reading and being on [employer]. School comments [name] graduated from [employer] in 2004 with a degree in Surveyor, minerals.   Allergies Allergen Reactions  . Morphine Dermatitis    Skin turned red   Physical Exam Ht   Wt   LMP 04/05/2011  Spastic quadriparesis with dystonia with some sparing of the left distal arm and hand.  Minimal movement of her legs.  Assessment 1. 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 2. Presence of intrathecal baclofen pump, Z98.89.  Discussion The procedure went well without complications.  The pump is in her right lower quadrant angled at about 30-45 degrees from horizontal with the center of the port about 1.5 cm below the surgical incision along the midline of it.  Plan She has 70 months of battery life.  She is scheduled to return for emptying and refilling in 168 days on or about July 21, 2015.   Medication List   This list is accurate as of: 02/03/15 11:59 PM.        acetaminophen 325 MG tablet  Commonly known as:  TYLENOL  Take 650 mg by mouth every 8 (eight) hours.     acetic acid 2 % otic solution  Commonly known as:  VOSOL  Place 4 drops into both ears once a week.     ARIPIPRAZOLE PO  Take 1 mg by mouth daily.     bacitracin-polymyxin b ointment  Commonly known as:  POLYSPORIN  Apply 1 application topically 2 (two) times daily as needed (Redness around cath site).     baclofen 10 MG tablet  Commonly known as:  LIORESAL  Take 20 mg by mouth 4 (four) times daily.     cetirizine 10 MG tablet  Commonly known as:  ZYRTEC  Take 5 mg by mouth daily.     diazepam 5 MG tablet  Commonly known as:  VALIUM  Take 1 tablet (5 mg total) by mouth every 6 (six) hours as needed for muscle spasms.     FLUoxetine 20 MG tablet  Commonly known as:   PROZAC  Take 20 mg by mouth daily.     HYDROcodone-acetaminophen 5-325 MG per tablet  Commonly known as:  NORCO/VICODIN  Take 1 tablet by mouth every 6 (six) hours as needed (pain in both knees).     ibuprofen 400 MG tablet  Commonly known as:  ADVIL,MOTRIN  Take 400 mg by mouth every 8 (eight) hours as needed for mild pain.     IMITREX 100 MG tablet  Generic drug:  SUMAtriptan  Take 100 mg by mouth every 2 (two) hours as needed for migraine or headache. May repeat in 2 hours if headache persists or recurs.     lamoTRIgine 200 MG tablet  Commonly known as:  LAMICTAL  Take 200 mg by mouth daily.     lidocaine 5 %  Commonly known as:  LIDODERM  Place 1 patch onto the skin daily. Remove & Discard patch within 12 hours or as directed by MD     multivitamin tablet  Take 1 tablet by mouth daily.     polyethylene glycol packet  Commonly known as:  MIRALAX / GLYCOLAX  Take 17 g by mouth 2 (two) times daily.     QUEtiapine 50 MG tablet  Commonly known as:  SEROQUEL  Take 50 mg by mouth at bedtime.     rivaroxaban 20 MG Tabs tablet  Commonly known as:  XARELTO  Take 20 mg by mouth daily with supper.     senna-docusate 8.6-50 MG per tablet  Commonly known as:  Senokot-S  Take 1 tablet by mouth 2 (two) times daily.     tiZANidine 4 MG tablet  Commonly known as:  ZANAFLEX  Take 4 mg by mouth 4 (four) times daily.      The medication list was reviewed and reconciled. All changes or newly prescribed medications were explained.  A complete medication list was provided to the patient/caregiver.  Deetta PerlaWilliam H Faizon Capozzi MD

## 2015-02-17 ENCOUNTER — Non-Acute Institutional Stay (INDEPENDENT_AMBULATORY_CARE_PROVIDER_SITE_OTHER): Payer: Medicaid Other | Admitting: Family Medicine

## 2015-02-17 DIAGNOSIS — S60429A Blister (nonthermal) of unspecified finger, initial encounter: Secondary | ICD-10-CM

## 2015-02-18 DIAGNOSIS — S60429A Blister (nonthermal) of unspecified finger, initial encounter: Secondary | ICD-10-CM | POA: Insufficient documentation

## 2015-02-18 NOTE — Progress Notes (Signed)
Patient ID: [name] [name], female   DOB: 1976-11-28, 39 y.o.   MRN: [id number]  Marikay AlarEric Nilay Mangrum, MD Phone: [phone]  [name] [name] is a 39 y.o. female who is seen today for a nursing home visit.   Nursing reports that the patient was [facility] for botox injections in her bladder and when she was getting in to the transport vehicle her right index finger came in contact with an LED light. She had pain immediately following this, though no pain shortly after this incident. Now the area has a blister.    ROS: Per HPI   Physical Exam Filed Vitals:   02/18/15 1422  BP: 153/65  Pulse: 55  Temp: 96.3 F (35.7 C)  Resp: 20    Gen: Well NAD Skin: right index finger dorsum with 2 cm x 1 cm blister with clear appearing fluid noted, no surrounding erythema, sensation intact in the right hand and all fingers    Assessment/Plan: Please see individual problem list.  Marikay AlarEric Afsana Liera, MD [facility] PGY-3

## 2015-02-18 NOTE — Assessment & Plan Note (Signed)
Likely related to coming in to contact with light on transport vehicle. No signs of infection. No pain per patient report. Will continue to monitor. Advised nursing of precautions to look for and to call on-call physician over the weekend for these.

## 2015-02-20 ENCOUNTER — Encounter: Payer: Self-pay | Admitting: Pharmacist

## 2015-02-22 ENCOUNTER — Other Ambulatory Visit: Payer: Self-pay | Admitting: Family Medicine

## 2015-02-22 ENCOUNTER — Non-Acute Institutional Stay: Payer: Medicaid Other | Admitting: Family Medicine

## 2015-02-22 DIAGNOSIS — S60429D Blister (nonthermal) of unspecified finger, subsequent encounter: Secondary | ICD-10-CM | POA: Diagnosis not present

## 2015-02-22 DIAGNOSIS — N319 Neuromuscular dysfunction of bladder, unspecified: Secondary | ICD-10-CM | POA: Diagnosis not present

## 2015-02-22 DIAGNOSIS — R404 Transient alteration of awareness: Secondary | ICD-10-CM

## 2015-02-22 DIAGNOSIS — Z9350 Unspecified cystostomy status: Secondary | ICD-10-CM

## 2015-02-22 DIAGNOSIS — S0991XD Unspecified injury of ear, subsequent encounter: Secondary | ICD-10-CM | POA: Diagnosis not present

## 2015-02-22 DIAGNOSIS — Z9359 Other cystostomy status: Secondary | ICD-10-CM

## 2015-02-22 DIAGNOSIS — R4020 Unspecified coma: Secondary | ICD-10-CM

## 2015-02-23 ENCOUNTER — Other Ambulatory Visit: Payer: Self-pay | Admitting: Family Medicine

## 2015-02-23 MED ORDER — CLONAZEPAM 1 MG PO TABS: 1.0000 mg | ORAL_TABLET | Freq: Four times a day (QID) | ORAL | Status: DC

## 2015-02-27 NOTE — Assessment & Plan Note (Signed)
Appears to progressing well. She does note discomfort from this. Will have wound nurse continue to follow this. Will write for santyl ointment to be applied to the lesion. She has been afebrile and there is no spreading erythema, so doubt infection at this time. Will need to continue to monitor for healing and for signs of infection. Treat pain with patients prn vicodin.

## 2015-02-27 NOTE — Assessment & Plan Note (Signed)
Patient now with small ulceration on right ear. No apparent erythema to indicate infection at this time. Likely a contact irritant related to pressure from head strap and saliva as patient chronically has her head tilted that direction and has saliva pool in that area leading to irritation. Will have them apply duoderm to the right ear to help protect the area as we have not been able to get them to use the creams.

## 2015-02-27 NOTE — Assessment & Plan Note (Signed)
Stable at this time. To have [facility]. Continue to exchange suprapubic catheter every 2-3 weeks and to flush catheter.

## 2015-02-27 NOTE — Assessment & Plan Note (Signed)
Patient reports possible LOC for 10 second period. No preceding symptoms so vasovagal unlikely. She is not ambulatory so orthostasis is unlikely. No history of seizures and lack of post-ictal period make seizure activity unlikely. EKG obtained revealing NSR, rate 89, baseline wander, QTc of 415, and no apparent ischemic changes. Prior echo without structural issues. Unlikely to be related to structural cardiac issue given echo and EKG. Possibly related to patients psychotropic medications making patient drowsy and her falling asleep with out realizing it. Will continue to monitor. Discussed with patient that she needs to let the nurses know if she has any recurrence or symptoms (CP, palpitations, shaking, light headedness).

## 2015-02-27 NOTE — Progress Notes (Signed)
Patient ID: [name] SlickerJudith [name], female   DOB: [birth year], 39 y.o.   MRN: [id number]  Marikay AlarEric Sonnenberg, MD Phone: [phone]  [name] [name] is a 39 y.o. female who is seen for bimonthly PCP nursing home follow-up.  Right index finger lesion: this occurred after patient came in to contact with a light on her transport vehicle. Initially there was a blister, though now has developed slough and mild surrounding redness. The is some discomfort in the area. No dressing has been placed on the area as of yet.   Loss of consciousness: patient reports 10 second episode of "blacking out" yesterday. She notes she was sitting out in the hall when this occurred. No one witnessed this. She notes not remembering what happened during this time period. It occurred out of no where. No chest pain, dyspnea, palpitations, or post ictal phase. No recent change in meds. States this happened 3-4 years ago. Had echo 10/24/14 that had normal EF and wall motion and no apparent valvular issues.   Right ear wound: no apparent pain at this time. They still have not gotten the hydrocortisone cream for this area. Patient reports that they have not been applying the barrier cream BID either.   Neurogenic bladder: patient had pre-op visit last week for botox injections. Is supposed to go for these injections on 02/24/15. Notes no issues currently with her suprapubic catheter. This has continued to be flushed and changed out every 2-3 weeks. [facility].   ROS: Per HPI   Physical Exam Filed Vitals:   02/22/15 1750  BP: 124/70  Pulse: 67  Temp: 98.1 F (36.7 C)  Resp: 20    Gen: Well NAD HEENT: PERRL,  MMM, right ear with small ulceration at the mid superior anti helix, no surrounding erythema, mild pain on palpation Lungs: CTABL Nl WOB Heart: RRR  Abd: soft, NT, ND Skin: right inder finger with 2x1 cm area in the place of the former blister with proximal slough, and mild surrounding erythema, distally there  is overlying skin from the blister present Exts: Non edematous BL  LE, warm and well perfused.    Assessment/Plan: Please see individual problem list.  Marikay AlarEric Sonnenberg, MD [facility] PGY-3

## 2015-02-28 ENCOUNTER — Telehealth: Payer: Self-pay | Admitting: Family Medicine

## 2015-02-28 NOTE — Telephone Encounter (Signed)
Called and spoke to Dr Logan BoresEvans regarding nursing finding her catheter in her vagina. He states that it was likely that the catheter came out of her urethra and that all we needed to do was deflate the balloon and pull the catheter back in to her bladder. I reported that this was already done and just wanted to make sure that nothing else needed to be done. He stated no. We will continue to monitor this issue for recurrence.

## 2015-03-03 NOTE — Progress Notes (Signed)
Patient ID: [name] SlickerJudith [name], female   DOB: 1976/03/02, 39 y.o.   MRN: [id number] I have seen and examined this patient with Dr Birdie SonsSonnenberg. I have discussed with Dr Birdie SonsSonnenberg his assessment and plans.  I agree with their findings and plans as documented in their regulatory visit note.

## 2015-03-03 NOTE — Addendum Note (Signed)
Addended byPerley Jain: Graylon Amory D on: 03/03/2015 04:43 PM   Modules accepted: Level of Service

## 2015-03-15 ENCOUNTER — Encounter: Payer: Self-pay | Admitting: *Deleted

## 2015-03-15 ENCOUNTER — Ambulatory Visit (INDEPENDENT_AMBULATORY_CARE_PROVIDER_SITE_OTHER): Payer: Medicaid Other | Admitting: Neurology

## 2015-03-15 ENCOUNTER — Encounter: Payer: Self-pay | Admitting: Neurology

## 2015-03-15 VITALS — BP 126/72 | HR 68

## 2015-03-15 DIAGNOSIS — G809 Cerebral palsy, unspecified: Secondary | ICD-10-CM | POA: Diagnosis not present

## 2015-03-15 DIAGNOSIS — G801 Spastic diplegic cerebral palsy: Secondary | ICD-10-CM

## 2015-03-15 DIAGNOSIS — R252 Cramp and spasm: Secondary | ICD-10-CM

## 2015-03-15 NOTE — Progress Notes (Signed)
HPI:   [name] [name] is a 39 year old Female with spastic and dystonic quadriparesis, depression/ anxiety, episodes where she loses contact with her world and that have been studied thoroughly and appear nonepileptic in nature, migraine headaches.  She is currently taking coumadin for her previous DVT, recurrent pulmonary emboli.  She came in for EMG guided BOTOX A injection, previously was done by Dr. Kelli Hope since Feb, 2006, received injection at [facility] prior to that.   BOTOX has always been to her right arm, where she often complains of spastic pain, her left arm even though has significant spasticity, is her functional arm.  She used her left arm to manipulate wheelchair, operating cellphone and feed herself.  She also has intrathecal baclofen pump. She used to be able to transfer herself in/out of wheelchair, living in NH now she has gradually worsening functional capacity over the past few month, especially since her recent hospital admission in August 2012. She complained of syncopy, there was associated hypoxia to 83%, CT angiogram of bran and neck were normal. EEG ws normal. abilify and klonopin dosage were decreased  She was discharged to nursing home now, she could no longer transfer herself in and out of wheelchair, she has less functional left arm.  UPDATE June 3rd 2015: Last injection was in Feb 2015, she received injection to her right arm, and left hand which has helped her a lot, there was no significant side effect noticed.  Today she complains of worsening left arm function, difficulty straight out fingers, wants more injection to her left hand, which is her functional hand   She was admitted to hospital in April the fifteenth for abdominal pain, likely related to constipations, CT showed large stool burden, she was treated with Colace, MiraLax, and enema, symptoms has resolved, she is to continue taking Kerin Salen for her pulmonary emboli  UPDATE Sept 23 2015: She had Botox  injection in June third 2015, she complains of difficulty straight out her left hand, increased difficulty using her left hand, which is her functional hand, operating wheelchair, want today's injection emphasize on her left hand.   Update January sixth 2016:: She responded very well to previous Botox injection September 20 third, she was able to use her left hand better, no significant side effect noticed, she wants to continue injection with her left arm  UPDATE March 15 2015: She responded very well to previous injection in January 2016,she can relax her left hand better, no significant side effect noticed.  Physical Examinations:  Mental Status: seated in wheelchair, slumped slightly, with head band and chest vest to hold her body posture.  Cranial Nerves: round minimally reactive pupils,  conjugate eye movement, dystonic but symmetric facial strength, difficulty protruding her tongue, severe dysarthria to the point of being in unintelligible at times Motor: right arm in shoulder external rotation, pronation, elbow extended, wrist and finger flexion, maximum 90 with right wrist extension. she has uncontrollable right shoulder posterior extension.  Left arm able to have antigravity movement, but slow movement under voluntary control, stayed in left elbow pronation, flexion. The left hand was fisted. Very slow release of her left finger flexion, with passive movement, she has full range of motion of left shoulder, elbow, and left fingers. She was able to move her legs against gravity 2/5.  Sensory: Withdrawal to pain Coordination:  poor coordination Gait and Station: spastic diplegia, in wheelchair   Assessment and Plan:  39 yo Caucasian female with spastic quadriplegia, had EMG guided Botox injection for  bilateral upper extremity spasticity, continue to complain left hands forceful finger flexion, thumb in position,    Used 300 units of BOTOX A, 100 units/2cc normal saline.  Left flexor  digitorum profundus 25 units Left flexor digitorum superficialis 25 Left opponents  25 Left hand lumricals for total of 37.5 Left biceps 12.5 units Left pronator teres 25 units Left brachialis 25 x2 units=50  Right biceps 25x2=50 units Right brachialis 25 x2= 50 units   She tolerated the injection well, will return to clinic in 3 months for repeat injection  [name] FeinsteinYijun Normand Damron, M.D. Ph.D.  [facility] [address] [place], [place] [address] Phone: [phone] Fax:      [phone]

## 2015-03-20 ENCOUNTER — Telehealth: Payer: Self-pay | Admitting: Neurology

## 2015-03-20 NOTE — Telephone Encounter (Signed)
Pt is calling stating her Botox didn't work and would like for the nurse to give her a call back.

## 2015-03-20 NOTE — Telephone Encounter (Signed)
FYI - spoke to [name] - she wanted you to know that the Botox has helped very little so far - she is going to give it another week and call back if no better.

## 2015-03-24 ENCOUNTER — Encounter: Payer: Self-pay | Admitting: Pharmacist

## 2015-03-27 ENCOUNTER — Non-Acute Institutional Stay (INDEPENDENT_AMBULATORY_CARE_PROVIDER_SITE_OTHER): Payer: Medicaid Other | Admitting: Family Medicine

## 2015-03-27 ENCOUNTER — Encounter: Payer: Self-pay | Admitting: Family Medicine

## 2015-03-27 DIAGNOSIS — L980 Pyogenic granuloma: Secondary | ICD-10-CM | POA: Insufficient documentation

## 2015-03-27 NOTE — Progress Notes (Signed)
[name] [name] is a 39 y.o. female seen in nursing home per RN request for R hand lesion.  R hand lesion - Unknown duration on dorsal aspect of R PIP dorsum hand.  No surrounding erythema, edema, or systemic Sx.  Denies any pruritis at this time.  No topical Tx and is not enlarging.    Past Medical History  Diagnosis Date  . Cerebral palsy   . Pulmonary embolism     Lifetime Coumadin  . Cerebral palsy     Spastic Cerebral palsy, mentally intact  . Contracture, joint, multiple sites     Electric wheelchair, uses left hand to operate chair.   [name] Kitchen Hypertension   . OCD (obsessive compulsive disorder)   . Depression   . Migraine   . [name]   . Dysarthria   . Endometriosis   . History of recurrent UTIs   . GERD (gastroesophageal reflux disease)   . Pulmonary embolism 2011, 01/2011    Will be on lifetime coumadin  . Anemia   . Abdominal pain 01/02/2012    Overview:  Overview:  Per Previous pcp note- Dr. Ta--Pt has history of endometriosis and had DUB in 2011.  She She had u/s that showed normal endometrial stripe.  She had endometrial bx that was wnl.  She has a lot of abd cramping every month.  This cramping was sometimes not relieved with hydrocodone.  She was referred to [facility] for IUD placement to help endometriosis and abd cramping.  [name]  . Macroscopic hematuria 03/10/2013    status post replacement of suprapubic tube 03/04/2013   . HYPERTENSION, BENIGN 01/31/2010    Qualifier: Diagnosis of  By: [name], MD, [name] History of endometriosis 10/2012    [facility]GYN [facility]: Laparoscopic Endometrial Ablation, TVH,  . Gram positive sepsis 10/24/2014  . HCAP (healthcare-associated pneumonia) 10/25/2014  . Abdominal pain 01/02/2012    Overview:  Overview:  Per Previous pcp note- Dr. Ta--Pt has history of endometriosis and had DUB in 2011.  She She had u/s that showed normal endometrial stripe.  She had endometrial bx that was wnl.  She has a lot of abd cramping  every month.  This cramping was sometimes not relieved with hydrocodone.  She was referred to [facility] for IUD placement to help endometriosis and abd cramping.  [name] was placed 04/30/11 by [facility].  Pt still complains of abd cramping, which I am treating with hydrocodone and ultram. Pt reports some initial relief of pain from IUD, but states it has now returned back to similar to previous cramping. Gave patient a shot of Toradol 60 mg IM today-since positive exacerbation of abdominal cramping during past 2 weeks. Physical exam reassuring. Patient may need to return to [facility] for further workup since she is plugged in with the GYN providers there for further treatment options regarding endometriosis and abdominal cramping. The dysfunctional uterine bleeding now well contr  . Acute respiratory failure with hypoxia   . Cellulitis 12/22/2014  . Disease of female genital organs 10/24/2010    Overview:  Overview:  Pt has history of endometriosis and had DUB in 2011.  She had u/s that showed normal endometrial stripe.  She had endometrial bx that was wnl.  She has a lot of abd cramping every month.  This cramping was sometimes not relieved with hydrocodone.  She was referred to [facility] for IUD placement to help endometriosis and abd cramping.  [name] was placed 04/30/11 by  [facility].  Pt still complains of abd cramping, which was treated with hydrocodone and ultram.   [facility], given Depo Lupron injection.  Ordering CT scan to r/o additional cause of abdominal pain (pt with known endometriosis and will not tolerate vaginal U/S, pain not improved s/p [name] or with Lupron injection-- only caused hot flashes and amenorrhea, no pain relief).  Also made referral to pain clinic for better pain control.  Do not think hysterectomy would help with pain, and concern for contamination of baclofen pump if did proceed with surgery.    8/5/13[name]: [facility]: Pt was seen by   . Dyspnea and  respiratory abnormality 02/04/2011    Overview:  Overview:  Pt has history of recurrent PE and is on chronic coumadin.  Pt has been complaining of dyspnea that she describes as chest tightness.  She has been to the ER multiple times for this.  Usually she gets CTA and serial CE.  She has had at least 6 CTA in a period of since 2011.  She also has an O2 requirement. It is unclear why pt feels dyspneic, complains of chest tightness, or has O2 requirement.  I thought that her complaint may be from deconditioning and referred her to PT.  Unfortunately Medicaid only pays for 4 PT visits.  Pt should be doing these exercises at home, but she has not.  I have referred her to [facility], and she has been seen by Dr Marchelle Gearingamaswamy.  He did not understand the etiology of her dyspnea and O2 requirement.  He will continue to follow her.   Last Assessment & Plan:  Pt presents with concerns for chest tightness and dyspnea.  She was seen in ED yesterday and discharged home.  While there she had a CT chest 04/20/11 showed 1.  Multifocal degradation as detailed above.  N  . Gross hematuria 03/12/2013    Overview:  Overview:  status post replacement of suprapubic tube 03/04/2013  Last Assessment & Plan:  Recent urine culture collected in ED on 5/4 did not result in significant growth. Moreover, patient has been asymptomatic with normal WBC, no fever and no true symptoms making symptomatic UTI less likely.  Will continue to monitor for any sign of infection.  Patient's xarelto had been held during her recent hospitalization due to hematuria. This has since been re-started. Monitor bleeding.    [name] Kitchen. History of anticoagulant therapy 10/21/2012  . Infantile cerebral palsy 01/29/2007    Overview:  She has spastic CP.  She is mentally intact.  She is wheelchair bound.  She is wheelchair bound for ambulation      . Intracervical pessary 05/07/2011    Overview:  Overview:  [facility]11 to treat DUB and Endometriosis.  [facility]  [facility] but was considered a poor surgical candidate and [facility]  For DUB she had pelvic ultrasound that showed thin stripe and she had endometrial Bx by Dr Jennette KettleNeal in GYN clinic, which was negative.     . Parapneumonic effusion 10/25/2014  . Presence of intrathecal baclofen pump 03/07/2014    R LQ. Insertion T10.    [name] Kitchen. Psychiatric illness 10/31/2014  . Recurrent pulmonary embolism 05/03/2011    Pt has history of recurrent PE and is on chronic coumadin.  Pt has been complaining of dyspnea that she describes as chest tightness.  She has been to the ER multiple times for this.  Usually she gets CTA and serial CE.  She has had at least 6 CTA in a period of since 2011.  She also has an O2 requirement. It is unclear why pt feels dyspneic, complains of chest tightness, or has O2 requirement.  I thought that her complaint may be from deconditioning and referred her to PT.  Unfortunately Medicaid only pays for 4 PT visits.  Pt should be doing these exercises at home, but she has not.  I have referred her to [facility], and she has been seen by [name].  He did not understand the etiology of her dyspnea and O2 requirement.  He will continue to follow her.     . Seborrheic keratosis 01/18/2011  . Spasticity 01/05/2014    History  Smoking status  . Never Smoker   Smokeless tobacco  . Never Used    Family History  Problem Relation Age of Onset  . Asthma Father   . Colon cancer Maternal Grandmother     Died in her 50's  . Cancer Paternal Grandmother   . Breast cancer Paternal Grandmother     Died in her 48's  . Heart attack Maternal Grandfather     Died in his 47's  . Alzheimer's disease Paternal Grandfather     Died in his 70's    Current Outpatient Prescriptions on File Prior to Visit  Medication Sig Dispense Refill  . acetaminophen (TYLENOL) 325 MG tablet Take 650 mg by mouth every 8 (eight) hours.     [name] Kitchen acetic acid (VOSOL) 2 % otic solution Place 4 drops into both ears  once a week.    . ARIPIPRAZOLE PO Take 1 mg by mouth daily.    . baclofen (LIORESAL) 10 MG tablet Take 20 mg by mouth 4 (four) times daily.     . cetirizine (ZYRTEC) 10 MG tablet Take 5 mg by mouth 2 (two) times daily.     . clonazePAM (KLONOPIN) 1 MG tablet Take 1 tablet (1 mg total) by mouth 4 (four) times daily. 120 tablet 5  . FLUoxetine (PROZAC) 20 MG tablet Take 20 mg by mouth daily.    [name] Kitchen HYDROcodone-acetaminophen (NORCO/VICODIN) 5-325 MG per tablet Take 1 tablet by mouth every 6 (six) hours as needed (pain in both knees). 60 tablet 0  . ibuprofen (ADVIL,MOTRIN) 200 MG tablet Take 200 mg by mouth every 8 (eight) hours as needed for mild pain.    [name] Kitchen lamoTRIgine (LAMICTAL) 200 MG tablet Take 200 mg by mouth daily.    [name] Kitchen lidocaine (LIDODERM) 5 % Place 1 patch onto the skin daily. Remove & Discard patch within 12 hours or as directed by MD    . Multiple Vitamin (MULTIVITAMIN) tablet Take 1 tablet by mouth daily.    . OnabotulinumtoxinA (BOTOX IJ) Inject 400 Units as directed.    . polyethylene glycol (MIRALAX / GLYCOLAX) packet Take 17 g by mouth 2 (two) times daily.     . QUEtiapine (SEROQUEL) 100 MG tablet Take 100 mg by mouth at bedtime.    . rivaroxaban (XARELTO) 20 MG TABS tablet Take 20 mg by mouth daily with supper.    . senna-docusate (SENOKOT-S) 8.6-50 MG per tablet Take 1 tablet by mouth 2 (two) times daily.    . SUMAtriptan (IMITREX) 100 MG tablet Take 100 mg by mouth every 2 (two) hours as needed for migraine or headache. May repeat in 2 hours if headache persists or recurs.    [name] Kitchen tiZANidine (ZANAFLEX) 4 MG tablet Take 4  mg by mouth 3 (three) times daily.     No current facility-administered medications on file prior to visit.    ROS: Per HPI.  All other systems reviewed and are negative.   Physical Exam Vitals reviewed per nursing notes  Physical Examination: General appearance - alert, well appearing, and in no distress Extremities - No R arm edema, erythema, or warmth   Skin - R hand 2nd dorsal PIP with 0.5 x 0.75 cm erythematous papule

## 2015-03-27 NOTE — Assessment & Plan Note (Signed)
R dorsal 2nd PIP  - No surrounding erythema, edema, or warmth - Continue to monitor as not enlarging.  Could consider ED&C vs topical tx if enlarges.

## 2015-03-30 LAB — BASIC METABOLIC PANEL
BUN: 16 mg/dL (ref 4–21)
Creatinine: 0.5 mg/dL (ref 0.5–1.1)
Glucose: 101 mg/dL
Potassium: 4.2 mmol/L (ref 3.4–5.3)
Sodium: 139 mmol/L (ref 137–147)

## 2015-03-30 LAB — HEPATIC FUNCTION PANEL
ALK PHOS: 59 U/L (ref 25–125)
ALT: 12 U/L (ref 7–35)
AST: 8 U/L — AB (ref 13–35)
Bilirubin, Total: 0.3 mg/dL

## 2015-04-17 ENCOUNTER — Telehealth: Payer: Self-pay | Admitting: Family

## 2015-04-17 NOTE — Telephone Encounter (Signed)
Noted, and agree. Thank you.

## 2015-04-17 NOTE — Telephone Encounter (Signed)
[name] [name] left a message asking me why her CP had gotten worse as she had gotten older. I called her back and explained that because she has limited ability to exercise, that her joints had stiffened and muscle spasticity had gradually worsened over time. I also told her that physical ability was also related to how the brain changed over time with age. [name] [name] said that she had friends with CP who were able to be more independent and more mobile. I explained to her that each person was an individual but in general, persons who were able to exercise their limbs and be more active overall, faired better than persons who could not. She had no further questions. TG

## 2015-04-19 ENCOUNTER — Telehealth: Payer: Self-pay | Admitting: Family Medicine

## 2015-04-19 NOTE — Telephone Encounter (Signed)
Received call from [facility] reporting the patient was reporting chest pain and shortness of breath. Vital signs stable and no hypoxemia. Given stable vital signs age and lack of risk factors, I informed the RN that can continue to monitor and provide supportive care. She is to call me if this continues or worsens.

## 2015-04-24 ENCOUNTER — Encounter: Payer: Self-pay | Admitting: Student-PharmD

## 2015-05-02 ENCOUNTER — Non-Acute Institutional Stay: Payer: Medicaid Other | Admitting: Family Medicine

## 2015-05-02 ENCOUNTER — Telehealth: Payer: Self-pay | Admitting: Family Medicine

## 2015-05-02 DIAGNOSIS — S0991XD Unspecified injury of ear, subsequent encounter: Secondary | ICD-10-CM

## 2015-05-02 DIAGNOSIS — G809 Cerebral palsy, unspecified: Secondary | ICD-10-CM | POA: Diagnosis not present

## 2015-05-02 DIAGNOSIS — R404 Transient alteration of awareness: Secondary | ICD-10-CM | POA: Diagnosis not present

## 2015-05-02 DIAGNOSIS — N319 Neuromuscular dysfunction of bladder, unspecified: Secondary | ICD-10-CM | POA: Diagnosis not present

## 2015-05-02 DIAGNOSIS — R4020 Unspecified coma: Secondary | ICD-10-CM

## 2015-05-02 DIAGNOSIS — G801 Spastic diplegic cerebral palsy: Secondary | ICD-10-CM

## 2015-05-02 NOTE — Telephone Encounter (Signed)
Received call from [facility] nurse reporting that [name] was complaining of some chest pain.  Nurse reports CP started after an upsetting conversation with her boyfriend.  Vital signs currently stable and she is afebrile.  She does have a history of PEs, currently on Xarelto.  No tachycardia or hypoxia.    Advised getting EKG and troponin.  However, before these could be obtained she had resolution of her symptoms.  Defer workup at this time as patient sleeping comfortably without complaint.

## 2015-05-08 ENCOUNTER — Encounter: Payer: Self-pay | Admitting: Family Medicine

## 2015-05-08 ENCOUNTER — Other Ambulatory Visit: Payer: Self-pay | Admitting: Family Medicine

## 2015-05-08 DIAGNOSIS — G894 Chronic pain syndrome: Secondary | ICD-10-CM

## 2015-05-08 DIAGNOSIS — M1990 Unspecified osteoarthritis, unspecified site: Secondary | ICD-10-CM

## 2015-05-08 MED ORDER — HYDROCODONE-ACETAMINOPHEN 5-325 MG PO TABS: 1.0000 | ORAL_TABLET | Freq: Four times a day (QID) | ORAL | Status: DC | PRN

## 2015-05-08 NOTE — Assessment & Plan Note (Signed)
No recurrence. Suspect she fell asleep leading her to believe she passed out. Prior evaluation non-revealing. Will continue to monitor for recurrence.

## 2015-05-08 NOTE — Assessment & Plan Note (Signed)
No complaints regarding this today. Had baclofen pump refilled in March. Will need to return to Dr Darl HouseholderHickling's office in August to have this refilled. Will continue baclofen, zanaflex, and norco for pain control.

## 2015-05-08 NOTE — Progress Notes (Signed)
Patient ID: [name] [name], female   DOB: 25-Aug-1976, 39 y.o.   MRN: [id number]  [name] AlarEric Sonnenberg, MD Phone: [phone]  [name] [name] is a 39 y.o. female who is seen today for nursing home visit.  Patient reports no complaints today. Nursing reports no concerns.  Patient states she is doing well.  She reports her right ear is improved since the last time I saw her. They have been applying barrier cream to the area. She notes no discomfort in the ear. She states she has had no further issues with loss oc consciousness as she had in February. Denies chest pain and shortness of breath. Denies abdominal pain. Notes had suprapubic catheter changes about 2 weeks ago.  Notes no increased discomfort from CP. Is unsure when she see's Dr Sharene SkeansHickling next.   PMH: nonsmoker.   ROS: Per HPI  Medications reviewed.  Current Outpatient Prescriptions on File Prior to Visit  Medication Sig Dispense Refill  . acetaminophen (TYLENOL) 325 MG tablet Take 650 mg by mouth every 8 (eight) hours.     [name] Kitchen. acetic acid (VOSOL) 2 % otic solution Place 4 drops into both ears once a week. On Fridays.    . ARIPIPRAZOLE PO Take 1 mg by mouth daily.    . baclofen (LIORESAL) 10 MG tablet Take 20 mg by mouth 4 (four) times daily.     . barrier cream (NON-SPECIFIED) CREA Apply 1 application topically 3 (three) times daily. Apply to right ear.    . cetirizine (ZYRTEC) 10 MG tablet Take 5 mg by mouth at bedtime.     . clonazePAM (KLONOPIN) 1 MG tablet Take 1 tablet (1 mg total) by mouth 4 (four) times daily. 120 tablet 5  . FLUoxetine (PROZAC) 10 MG capsule Take 10 mg by mouth daily.    [name] Kitchen. HYDROcodone-acetaminophen (NORCO/VICODIN) 5-325 MG per tablet Take 1 tablet by mouth every 6 (six) hours as needed (pain in both knees). 60 tablet 0  . ibuprofen (ADVIL,MOTRIN) 200 MG tablet Take 400 mg by mouth every 8 (eight) hours as needed for mild pain.     [name] Kitchen. lamoTRIgine (LAMICTAL) 200 MG tablet Take 200 mg by mouth at bedtime.     .  mineral oil external liquid Place 1 drop into both ears every Monday, Wednesday, and Friday.    . Multiple Vitamin (MULTIVITAMIN) tablet Take 1 tablet by mouth daily.    . OnabotulinumtoxinA (BOTOX IJ) Inject 400 Units as directed.    . polyethylene glycol (MIRALAX / GLYCOLAX) packet Take 17 g by mouth 2 (two) times daily.     . QUEtiapine (SEROQUEL) 100 MG tablet Take 100 mg by mouth at bedtime.    . rivaroxaban (XARELTO) 20 MG TABS tablet Take 20 mg by mouth daily with supper.    . senna-docusate (SENOKOT-S) 8.6-50 MG per tablet Take 1 tablet by mouth 2 (two) times daily.    . SUMAtriptan (IMITREX) 100 MG tablet Take 100 mg by mouth every 2 (two) hours as needed for migraine or headache. May repeat in 2 hours if headache persists or recurs.    [name] Kitchen. tiZANidine (ZANAFLEX) 4 MG tablet Take 4 mg by mouth 3 (three) times daily.     No current facility-administered medications on file prior to visit.     Physical Exam Filed Vitals:   05/02/15 1057  BP: 98/63  Pulse: 84  Temp: 97.8 F (36.6 C)  Resp: 18    Gen: Well NAD, sitting in wheel chair with head tilted to the right  HEENT: PERRL,  MMM, right ear with no erythema or skin breakdown Lungs: CTABL Nl WOB Heart: RRR, no murmur appreciated Abd: soft, NT, ND Neuro: spastic muscle tone Exts: Non edematous BL  LE, warm and well perfused.    Assessment/Plan: Please see individual problem list.  [name] Alar, MD [facility] PGY-3

## 2015-05-08 NOTE — Assessment & Plan Note (Signed)
Stable. Continue to have catheter changed every 2-3 weeks and flush catheter at nursing facility. [facility] to bladder.

## 2015-05-08 NOTE — Assessment & Plan Note (Signed)
Improved at this time. No signs of irritant injury at this time, though remains at risk for this in the future given head position and saliva. Will continue barrier cream application to protect the area.

## 2015-05-10 ENCOUNTER — Telehealth: Payer: Self-pay | Admitting: Family

## 2015-05-10 NOTE — Telephone Encounter (Signed)
[name] [name] left a message saying that she was having increases spasms in her legs and wanted pump adjustment. I contacted [name] at [facility] to [facility], scheduled [name] [name] for June 10th and called [name] [name] to let her know. TG

## 2015-05-10 NOTE — Telephone Encounter (Signed)
Noted thank you

## 2015-05-12 ENCOUNTER — Encounter: Payer: Self-pay | Admitting: Pediatrics

## 2015-05-12 ENCOUNTER — Ambulatory Visit (INDEPENDENT_AMBULATORY_CARE_PROVIDER_SITE_OTHER): Payer: Medicaid Other | Admitting: Pediatrics

## 2015-05-12 VITALS — BP 94/64 | HR 84

## 2015-05-12 DIAGNOSIS — G803 Athetoid cerebral palsy: Secondary | ICD-10-CM | POA: Diagnosis not present

## 2015-05-12 NOTE — Patient Instructions (Addendum)
[name] [name] needs to have passive range of motion every morning when she awakens, and 3 more times during the day throughout the day.  As soon as this is done, the stiffness and tightness in her legs lessens greatly.  Please let me know how this can be facilitated

## 2015-05-13 NOTE — Progress Notes (Signed)
Patient: [name] [name] MRN: [id number] Sex: female DOB: [birth year]  Provider: Deetta Perla, MD Location of Care: [facility]  Note type: Urgent return visit to reprogram baclofen pump  History of Present Illness: Referral Source: Anselm Lis, M.D. 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 female who returns today at her request because of increased tightness in her lower extremities.  She insisted on being seen sooner than was planned and requested adjustment of her intrathecal baclofen pump.  She has significant stiffness in her legs.  She is not receiving passive range of motion more than three to four times a week.  She needs passive range of motion three to four times a day.  When I moved her legs in passive range of motion, stiffness dissipated considerably and she said that her legs felt better.  We have not been able to get her assisted-living facility to perform this activity even though it could be done by an aide when they got her up in the morning, they took care of hygiene.  If this was done, in my opinion, she would not have significant symptomatology as she has.  I agreed to increase her baclofen by 4%.  I have misgivings about it because it is not going to replace the need for regular physical therapy.  We will attempt to work through Catering manager of nursing to see if this can be accomplished.  She is too weak to be able to carry out range of motion exercises on her own.  I reprogrammed her intrathecal baclofen pump which will change the time of refill.  This is described below.  Procedure:  Reprogramming her intrathecal baclofen pump.  The device was interrogated and showed a complex continuous infusion of baclofen that has a basal rate of 14.8. mcg per hour. The patient receives bolus doses of 27 mcg delivered at midnight,  6 AM, 12 noon, and 6 PM. I reprogrammed her basal rate to 16.2 mcg/hour;  for total of 465.8 mcg per day.  Her reservoir alarm date is July 19, 2015, or 68 days from now. ERI 66 months.  She tolerated the procedure well.  Review of Systems: 12 system review was unremarkable except as noted in past medical history  Past Medical History Diagnosis Date  . Cerebral palsy   . Pulmonary embolism     Lifetime Coumadin  . Cerebral palsy     Spastic Cerebral palsy, mentally intact  . Contracture, joint, multiple sites     Electric wheelchair, uses left hand to operate chair.   [name] Kitchen Hypertension   . OCD (obsessive compulsive disorder)   . Depression   . Migraine   . [name]   . Dysarthria   . Endometriosis   . History of recurrent UTIs   . GERD (gastroesophageal reflux disease)   . Pulmonary embolism 2011, 01/2011    Will be on lifetime coumadin  . Anemia   . Abdominal pain 01/02/2012    Overview:  Overview:  Per Previous pcp note- Dr. Ta--Pt has history of endometriosis and had DUB in 2011.  She She had u/s that showed normal endometrial stripe.  She had endometrial bx that was wnl.  She has a lot of abd cramping every month.  This cramping was sometimes not relieved with hydrocodone.  She was referred to [facility] for IUD placement to help endometriosis  and abd cramping.  [name]  . Macroscopic hematuria 03/10/2013    status post replacement of suprapubic tube 03/04/2013   . HYPERTENSION, BENIGN 01/31/2010    Qualifier: Diagnosis of  By: [name], MD, [name] History of endometriosis 10/2012    OBGYN [facility]: Laparoscopic Endometrial Ablation, TVH,  . Gram positive sepsis 10/24/2014  . HCAP (healthcare-associated pneumonia) 10/25/2014  . Abdominal pain 01/02/2012    Overview:  Overview:  Per Previous pcp note- Dr. Ta--Pt has history of endometriosis and had DUB in 2011.  She She had u/s that showed normal endometrial stripe.  She had endometrial bx that was wnl.   She has a lot of abd cramping every month.  This cramping was sometimes not relieved with hydrocodone.  She was referred to [facility] for IUD placement to help endometriosis and abd cramping.  [name] was placed 04/30/11 by [facility].  Pt still complains of abd cramping, which I am treating with hydrocodone and ultram. Pt reports some initial relief of pain from IUD, but states it has now returned back to similar to previous cramping. Gave patient a shot of Toradol 60 mg IM today-since positive exacerbation of abdominal cramping during past 2 weeks. Physical exam reassuring. Patient may need to return to [facility] for further workup since she is plugged in with the GYN providers there for further treatment options regarding endometriosis and abdominal cramping. The dysfunctional uterine bleeding now well contr  . Acute respiratory failure with hypoxia   . Cellulitis 12/22/2014  . Disease of female genital organs 10/24/2010    Overview:  Overview:  Pt has history of endometriosis and had DUB in 2011.  She had u/s that showed normal endometrial stripe.  She had endometrial bx that was wnl.  She has a lot of abd cramping every month.  This cramping was sometimes not relieved with hydrocodone.  She was referred to [facility] for IUD placement to help endometriosis and abd cramping.  [name] was placed 04/30/11 by [facility].  Pt still complains of abd cramping, which was treated with hydrocodone and ultram.   Last seen at [facility] by OB/GYN 12/24/11, given Depo Lupron injection.  Ordering CT scan to r/o additional cause of abdominal pain (pt with known endometriosis and will not tolerate vaginal U/S, pain not improved s/p [name] or with Lupron injection-- only caused hot flashes and amenorrhea, no pain relief).  Also made referral to pain clinic for better pain control.  Do not think hysterectomy would help with pain, and concern for contamination of baclofen pump if did proceed with surgery.    8/5/13[name]: Pt  was seen by   . Dyspnea and respiratory abnormality 02/04/2011    Overview:  Overview:  Pt has history of recurrent PE and is on chronic coumadin.  Pt has been complaining of dyspnea that she describes as chest tightness.  She has been to the ER multiple times for this.  Usually she gets CTA and serial CE.  She has had at least 6 CTA in a period of since 2011.  She also has an O2 requirement. It is unclear why pt feels dyspneic, complains of chest tightness, or has O2 requirement.  I thought that her complaint may be from deconditioning and referred her to PT.  Unfortunately Medicaid only pays for 4 PT visits.  Pt should be doing these exercises at home, but she has not.  I have referred her to [facility], and she has been seen by Dr  Ramaswamy.  He did not understand the etiology of her dyspnea and O2 requirement.  He will continue to follow her.   Last Assessment & Plan:  Pt presents with concerns for chest tightness and dyspnea.  She was seen in ED yesterday and discharged home.  While there she had a CT chest 04/20/11 showed 1.  Multifocal degradation as detailed above.  N  . Gross hematuria 03/12/2013    Overview:  Overview:  status post replacement of suprapubic tube 03/04/2013  Last Assessment & Plan:  Recent urine culture collected in ED on 5/4 did not result in significant growth. Moreover, patient has been asymptomatic with normal WBC, no fever and no true symptoms making symptomatic UTI less likely.  Will continue to monitor for any sign of infection.  Patient's xarelto had been held during her recent hospitalization due to hematuria. This has since been re-started. Monitor bleeding.    [name] Kitchen History of anticoagulant therapy 10/21/2012  . Infantile cerebral palsy 01/29/2007    Overview:  She has spastic CP.  She is mentally intact.  She is wheelchair bound.  She is wheelchair bound for ambulation      . Intracervical pessary 05/07/2011    Overview:  Overview:  Placed by [facility] 04/30/11 to treat DUB  and Endometriosis.  She is seen at GYN clinic at [facility] but was considered a poor surgical candidate and referred her to [facility].  For DUB she had pelvic ultrasound that showed thin stripe and she had endometrial Bx by Dr Jennette Kettle in GYN clinic, which was negative.     . Parapneumonic effusion 10/25/2014  . Presence of intrathecal baclofen pump 03/07/2014    R LQ. Insertion T10.    [name] Kitchen Psychiatric illness 10/31/2014  . Recurrent pulmonary embolism 05/03/2011    Pt has history of recurrent PE and is on chronic coumadin.  Pt has been complaining of dyspnea that she describes as chest tightness.  She has been to the ER multiple times for this.  Usually she gets CTA and serial CE.  She has had at least 6 CTA in a period of since 2011.  She also has an O2 requirement. It is unclear why pt feels dyspneic, complains of chest tightness, or has O2 requirement.  I thought that her complaint may be from deconditioning and referred her to PT.  Unfortunately Medicaid only pays for 4 PT visits.  Pt should be doing these exercises at home, but she has not.  I have referred her to [facility], and she has been seen by [name].  He did not understand the etiology of her dyspnea and O2 requirement.  He will continue to follow her.     . Seborrheic keratosis 01/18/2011  . Spasticity 01/05/2014   Hospitalizations: Yes.  , Head Injury: No., Nervous System Infections: No., Immunizations up to date: Yes.    Behavior History anxiety, depression  Surgical History Past Surgical History  Procedure Laterality Date  . Colposcopy  06/2000  . Cesarean section      x 2  . Tubal ligation  2003  . Carpal tunnel release  08/2008    Dr Teressa Senter  . Wrist surgery  06/2010    Dr Dierdre Searles, hand surgeon, [name]  . Baclofen pump refill      x 3 times  . Appendectomy    . Intrauterine device insertion  04/30/11    Inserted by [facility] GYN for endometriosis  . Laparoscopically assisted vag hysterectomy  10/27/2012  .  Multiple extractions with alveoloplasty  Bilateral 07/19/2013    Procedure: EXTRACTIONS #4, 1,61,09,60;  Surgeon: Francene Finders, DDS;  Location: [facility] OR;  Service: Oral Surgery;  Laterality: Bilateral;  . Pain pump implantation N/A 03/07/2014    Procedure: baclofen pump revision/replacement and Catheter connection replacement;  Surgeon: Cristi Loron, MD;  Location: [facility];  Service: Neurosurgery;  Laterality: N/A;  baclofen pump revision/replacement and Catheter connection replacement  . Programable baclofen pump revision  03/17/14    Battery Replacement     Family History family history includes Alzheimer's disease in her paternal grandfather; Asthma in her father; Breast cancer in her paternal grandmother; Cancer in her paternal grandmother; Colon cancer in her maternal grandmother; Heart attack in her maternal grandfather. Family history is negative for migraines, seizures, intellectual disabilities, blindness, deafness, birth defects, chromosomal disorder, or autism.  Social History . Marital Status: Divorced    Spouse Name: N/A  . Number of Children: 2  . Years of Education: college   Occupational History  . UNEMPLOYED    Social History Main Topics  . Smoking status: Never Smoker   . Smokeless tobacco: Never Used  . Alcohol Use: 0.6 oz/week    1 Glasses of wine per week     Comment: Drinks alcohol once a month.  . Drug Use: No  . Sexual Activity: No   Social History Narrative   Ailene Ards, who has schizophrenia, MR, is father of daughter.  Homero Fellers and pt are no longer together.  Homero Fellers does not see [name]. Daughter is 63 yo, Stage manager, lives with pt.        [name] [phone]), lives with pt's parents.      Needs assistance with ADL's and IADL's--has personal care services 20 hrs/wk;       No tobacco, EtOH, drugs.      **Patient very concerned about her kids being taken from her.      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 Morphine Dermatitis    Skin turned red   Physical Exam BP 94/64 mmHg  Pulse 84  Ht   Wt   LMP 04/05/2011  General: alert, chronically ill, listing to the right side in her wheelchair limited movement, well nourished, in no acute distress, brown hair, brown eyes, right handed Head: normocephalic, no dysmorphic features Ears, Nose and Throat: Otoscopic: tympanic membranes normal; pharynx: oropharynx is pink without exudates or tonsillar hypertrophy Neck: supple, full range of motion, no cranial or cervical bruits Respiratory: auscultation clear Cardiovascular: no murmurs, pulses are normal Musculoskeletal: no skeletal deformities or apparent scoliosis; Contractures at both wrists, unable to extend her right wrist in neutral, able to extend her left wrist to neutral; Flexion contractures at her hips, markedly increased hip adductor tone Skin: no rashes or neurocutaneous lesions She has a suprapubic catheter to drain her bladder  Neurologic Exam  Mental Status: alert; oriented to person, place and year; knowledge is normal for age; language is normal; It is difficult to understand her because of severe oral motor apraxia. Cranial Nerves: visual fields are full to double simultaneous stimuli; extraocular movements are full and conjugate; pupils are around reactive to light; funduscopic examination shows sharp disc margins with normal vessels; symmetric facial strength; midline tongue and  uvula, However she is only able to slightly protrude her tongue to midline and to the right; air conduction is greater than bone conduction bilaterally Motor: Spastic dystonic quadriparesis; Right arm is flexed at the elbow and extended behind her body with her right hand in a clawhand this. She is able to extend her fingers and  grip with moderate weakness she has difficulty moving individual fingers. The left arm sits in her lap. She is able to extend it from her body but is not able to fully extend her fingers. Her fingers are tightly flexed but can be extended. She has purposeful movement of them. She is not able to elevate either of her arms to her shoulders; she can extend her legs at the knees but does not get to a fully extended position. She is not able to move her feet. She can minimally abduct her legs at the hips. With repeated range of motion at her knees hips and ankles, her spasticity declines considerably, and with that, her discomfort She has spasticity in her 4 limbs, Ashworth 2 in her legs, Ashworth 3 in her arms with clawhand deformities in both hands right greater than left. She is severely dysarthric and at times is difficult to understand her. She has significant kyphosis of the neck and is unable to keep her head upright. Sensory: intact responses to cold, vibration; She has problems with stereognosis because she can't her fingers Coordination: Unable to test Gait and Station: Wheelchair-bound Reflexes: symmetric and diminished bilaterally because of cocontraction; no clonus  Assessment 1.  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  Discussion As mentioned above, I think that passive range of motion would take care of this problem.  I interrogated her pump and changed it.  She is now projected to come three days earlier than had been previously planned.  Plan She will return on July 18, 2015.  I spent 15 minutes of face-to-face time with [name] in addition to interrogating her pump.   Medication List   This list is accurate as of: 05/12/15 11:59 PM.       acetaminophen 325 MG tablet  Commonly known as:  TYLENOL  Take 650 mg by mouth every 8 (eight) hours.     acetic acid 2 % otic solution  Commonly known as:  VOSOL  Place 4 drops into both ears once a week. On Fridays.     ARIPIPRAZOLE PO    Take 1 mg by mouth daily.     baclofen 10 MG tablet  Commonly known as:  LIORESAL  Take 20 mg by mouth 4 (four) times daily.     barrier cream Crea  Commonly known as:  non-specified  Apply 1 application topically 3 (three) times daily. Apply to right ear.     BOTOX IJ  Inject 400 Units as directed.     cetirizine 10 MG tablet  Commonly known as:  ZYRTEC  Take 5 mg by mouth at bedtime.     clonazePAM 1 MG tablet  Commonly known as:  KLONOPIN  Take 1 tablet (1 mg total) by mouth 4 (four) times daily.     FLUoxetine 10 MG capsule  Commonly known as:  PROZAC  Take 10 mg by mouth daily.     HYDROcodone-acetaminophen 5-325 MG per tablet  Commonly known as:  NORCO/VICODIN  Take 1 tablet by mouth every 6 (six) hours as needed (pain in both knees).     ibuprofen 200 MG tablet  Commonly known as:  ADVIL,MOTRIN  Take  400 mg by mouth every 8 (eight) hours as needed for mild pain.     IMITREX 100 MG tablet  Generic drug:  SUMAtriptan  Take 100 mg by mouth every 2 (two) hours as needed for migraine or headache. May repeat in 2 hours if headache persists or recurs.     lamoTRIgine 200 MG tablet  Commonly known as:  LAMICTAL  Take 200 mg by mouth at bedtime.     mineral oil external liquid  Place 1 drop into both ears every Monday, Wednesday, and Friday.     multivitamin tablet  Take 1 tablet by mouth daily.     polyethylene glycol packet  Commonly known as:  MIRALAX / GLYCOLAX  Take 17 g by mouth 2 (two) times daily.     QUEtiapine 100 MG tablet  Commonly known as:  SEROQUEL  Take 100 mg by mouth at bedtime.     rivaroxaban 20 MG Tabs tablet  Commonly known as:  XARELTO  Take 20 mg by mouth daily with supper.     senna-docusate 8.6-50 MG per tablet  Commonly known as:  Senokot-S  Take 1 tablet by mouth 2 (two) times daily.     tiZANidine 4 MG tablet  Commonly known as:  ZANAFLEX  Take 4 mg by mouth 3 (three) times daily.      The medication list was reviewed  and reconciled. All changes or newly prescribed medications were explained.  A complete medication list was provided to the patient/caregiver.  Deetta Perla MD

## 2015-05-13 NOTE — Progress Notes (Deleted)
Patient: [name] [name] MRN: [id number] Sex: female DOB: Feb 05, 1976  Provider: Deetta Perla, MD Location of Care: [facility]  Note type: {CN NOTE TYPES:210120001}  History of Present Illness: Referral Source: *** History from: {CN REFERRED OZ:[id number]} Chief Complaint: ***  [name] [name] is a 39 y.o. female referred for evaluation of ***.  Review of Systems: {cn system review:210120003}  Past Medical History Past Medical History  Diagnosis Date  . Cerebral palsy   . Pulmonary embolism     Lifetime Coumadin  . Cerebral palsy     Spastic Cerebral palsy, mentally intact  . Contracture, joint, multiple sites     Electric wheelchair, uses left hand to operate chair.   [name] Kitchen Hypertension   . OCD (obsessive compulsive disorder)   . Depression   . Migraine   . [name]   . Dysarthria   . Endometriosis   . History of recurrent UTIs   . GERD (gastroesophageal reflux disease)   . Pulmonary embolism 2011, 01/2011    Will be on lifetime coumadin  . Anemia   . Abdominal pain 01/02/2012    Overview:  Overview:  Per Previous pcp note- Dr. Ta--Pt has history of endometriosis and had DUB in 2011.  She She had u/s that showed normal endometrial stripe.  She had endometrial bx that was wnl.  She has a lot of abd cramping every month.  This cramping was sometimes not relieved with hydrocodone.  She was referred to [facility] for IUD placement to help endometriosis and abd cramping.  [name]  . Macroscopic hematuria 03/10/2013    status post replacement of suprapubic tube 03/04/2013   . HYPERTENSION, BENIGN 01/31/2010    Qualifier: Diagnosis of  By: [name], MD, [name] History of endometriosis 10/2012    OBGYN [facility]: Laparoscopic Endometrial Ablation, TVH,  . Gram positive sepsis 10/24/2014  . HCAP (healthcare-associated pneumonia) 10/25/2014  . Abdominal pain 01/02/2012    Overview:  Overview:  Per Previous pcp note- Dr. Ta--Pt has history  of endometriosis and had DUB in 2011.  She She had u/s that showed normal endometrial stripe.  She had endometrial bx that was wnl.  She has a lot of abd cramping every month.  This cramping was sometimes not relieved with hydrocodone.  She was referred to [facility] for IUD placement to help endometriosis and abd cramping.  [name] was placed 04/30/11 by [facility].  Pt still complains of abd cramping, which I am treating with hydrocodone and ultram. Pt reports some initial relief of pain from IUD, but states it has now returned back to similar to previous cramping. Gave patient a shot of Toradol 60 mg IM today-since positive exacerbation of abdominal cramping during past 2 weeks. Physical exam reassuring. Patient may need to return to [facility] for further workup since she is plugged in with the GYN providers there for further treatment options regarding endometriosis and abdominal cramping. The dysfunctional uterine bleeding now well contr  . Acute respiratory failure with hypoxia   . Cellulitis 12/22/2014  . Disease of female genital organs 10/24/2010    Overview:  Overview:  Pt has history of endometriosis and had DUB in 2011.  She had u/s that showed normal endometrial stripe.  She had endometrial bx that was wnl.  She has a lot of abd cramping every month.  This cramping was sometimes not relieved with hydrocodone.  She was referred to [facility] for IUD placement to help endometriosis  and abd cramping.  [name] was placed 04/30/11 by [facility].  Pt still complains of abd cramping, which was treated with hydrocodone and ultram.   Last seen at [facility] by OB/GYN 12/24/11, given Depo Lupron injection.  Ordering CT scan to r/o additional cause of abdominal pain (pt with known endometriosis and will not tolerate vaginal U/S, pain not improved s/p [name] or with Lupron injection-- only caused hot flashes and amenorrhea, no pain relief).  Also made referral to pain clinic for better pain control.  Do not think  hysterectomy would help with pain, and concern for contamination of baclofen pump if did proceed with surgery.    8/5/13[name] OB/GYN: Pt was seen by   . Dyspnea and respiratory abnormality 02/04/2011    Overview:  Overview:  Pt has history of recurrent PE and is on chronic coumadin.  Pt has been complaining of dyspnea that she describes as chest tightness.  She has been to the ER multiple times for this.  Usually she gets CTA and serial CE.  She has had at least 6 CTA in a period of since 2011.  She also has an O2 requirement. It is unclear why pt feels dyspneic, complains of chest tightness, or has O2 requirement.  I thought that her complaint may be from deconditioning and referred her to PT.  Unfortunately Medicaid only pays for 4 PT visits.  Pt should be doing these exercises at home, but she has not.  I have referred her to [facility], and she has been seen by [name].  He did not understand the etiology of her dyspnea and O2 requirement.  He will continue to follow her.   Last Assessment & Plan:  Pt presents with concerns for chest tightness and dyspnea.  She was seen in ED yesterday and discharged home.  While there she had a CT chest 04/20/11 showed 1.  Multifocal degradation as detailed above.  N  . Gross hematuria 03/12/2013    Overview:  Overview:  status post replacement of suprapubic tube 03/04/2013  Last Assessment & Plan:  Recent urine culture collected in ED on 5/4 did not result in significant growth. Moreover, patient has been asymptomatic with normal WBC, no fever and no true symptoms making symptomatic UTI less likely.  Will continue to monitor for any sign of infection.  Patient's xarelto had been held during her recent hospitalization due to hematuria. This has since been re-started. Monitor bleeding.    [name] Kitchen History of anticoagulant therapy 10/21/2012  . Infantile cerebral palsy 01/29/2007    Overview:  She has spastic CP.  She is mentally intact.  She is wheelchair bound.  She is wheelchair  bound for ambulation      . Intracervical pessary 05/07/2011    Overview:  Overview:  Placed by [facility]YN 04/30/11 to treat DUB and Endometriosis.  She is seen at GYN clinic at [facility] but was considered a poor surgical candidate and referred her to [facility].  For DUB she had pelvic ultrasound that showed thin stripe and she had endometrial Bx by Dr Jennette Kettle in GYN clinic, which was negative.     . Parapneumonic effusion 10/25/2014  . Presence of intrathecal baclofen pump 03/07/2014    R LQ. Insertion T10.    [name] Kitchen Psychiatric illness 10/31/2014  . Recurrent pulmonary embolism 05/03/2011    Pt has history of recurrent PE and is on chronic coumadin.  Pt has been complaining of dyspnea that she describes as chest tightness.  She has  been to the ER multiple times for this.  Usually she gets CTA and serial CE.  She has had at least 6 CTA in a period of since 2011.  She also has an O2 requirement. It is unclear why pt feels dyspneic, complains of chest tightness, or has O2 requirement.  I thought that her complaint may be from deconditioning and referred her to PT.  Unfortunately Medicaid only pays for 4 PT visits.  Pt should be doing these exercises at home, but she has not.  I have referred her to [facility], and she has been seen by [name].  He did not understand the etiology of her dyspnea and O2 requirement.  He will continue to follow her.     . Seborrheic keratosis 01/18/2011  . Spasticity 01/05/2014   Hospitalizations: {yes no:314532}, Head Injury: {yes no:314532}, Nervous System Infections: {yes no:314532}, Immunizations up to date: {yes no:314532}  ***  Birth History *** lbs. *** oz. infant born at *** weeks gestational age to a *** year old g *** p *** *** *** *** female. Gestation was {Complicated/Uncomplicated Pregnancy:20185} Mother received {CN Delivery analgesics:210120005}  {method of delivery:313099} Nursery Course was {Complicated/Uncomplicated:20316} Growth and Development was {cn  recall:210120004}  Behavior History {Symptoms; behavioral problems:18883}  Surgical History Past Surgical History  Procedure Laterality Date  . Colposcopy  06/2000  . Cesarean section      x 2  . Tubal ligation  2003  . Carpal tunnel release  08/2008    Dr Teressa Senter  . Wrist surgery  06/2010    Dr Dierdre Searles, hand surgeon, [name]  . Baclofen pump refill      x 3 times  . Appendectomy    . Intrauterine device insertion  04/30/11    Inserted by [facility] GYN for endometriosis  . Laparoscopically assisted vag hysterectomy  10/27/2012  . Multiple extractions with alveoloplasty Bilateral 07/19/2013    Procedure: EXTRACTIONS #4, 1,61,09,60;  Surgeon: Francene Finders, DDS;  Location: [facility] OR;  Service: Oral Surgery;  Laterality: Bilateral;  . Pain pump implantation N/A 03/07/2014    Procedure: baclofen pump revision/replacement and Catheter connection replacement;  Surgeon: Cristi Loron, MD;  Location: [facility] NEURO ORS;  Service: Neurosurgery;  Laterality: N/A;  baclofen pump revision/replacement and Catheter connection replacement  . Programable baclofen pump revision  03/17/14    Battery Replacement     Family History family history includes Alzheimer's disease in her paternal grandfather; Asthma in her father; Breast cancer in her paternal grandmother; Cancer in her paternal grandmother; Colon cancer in her maternal grandmother; Heart attack in her maternal grandfather. Family history is negative for migraines, seizures, intellectual disabilities, blindness, deafness, birth defects, chromosomal disorder, or autism.  Social History History   Social History  . Marital Status: Divorced    Spouse Name: N/A  . Number of Children: 2  . Years of Education: college   Occupational History  .    [name] Kitchen UNEMPLOYED    Social History Main Topics  . Smoking status: Never Smoker   . Smokeless tobacco: Never Used  . Alcohol Use: 0.6 oz/week    1 Glasses of wine per week     Comment: Drinks alcohol  once a month.  . Drug Use: No  . Sexual Activity: No   Other Topics Concern  . None   Social History Narrative   Ailene Ards, who has schizophrenia, MR, is father of daughter.  Homero Fellers and pt are no longer together.  Homero Fellers does not see [name].  Daughter is 82 yo, Stage manager, lives with pt.        [name] [phone]), lives with pt's parents.      Needs assistance with ADL's and IADL's--has personal care services 20 hrs/wk;       No tobacco, EtOH, drugs.      **Patient very concerned about her kids being taken from her.      **Case Manager: Jack Quarto [phone]      Patient lives [place]   Educational level {Misc; education levels:33222} School Attending: *** {school level:210120006} school. Occupation: Consulting civil engineer *** Living with {companion:315061}  Hobbies/Interest: {NONE XBJYNWGNF:62130} School comments ***  Allergies Allergies  Allergen Reactions  . Morphine Dermatitis    Skin turned red    Physical Exam BP 94/64 mmHg  Pulse 84  Ht   Wt   LMP 04/05/2011  ***   Assessment   Discussion   Plan    Medication List       This list is accurate as of: 05/12/15 11:59 PM.  Always use your most recent med list.               acetaminophen 325 MG tablet  Commonly known as:  TYLENOL  Take 650 mg by mouth every 8 (eight) hours.     acetic acid 2 % otic solution  Commonly known as:  VOSOL  Place 4 drops into both ears once a week. On Fridays.     ARIPIPRAZOLE PO  Take 1 mg by mouth daily.     baclofen 10 MG tablet  Commonly known as:  LIORESAL  Take 20 mg by mouth 4 (four) times daily.     barrier cream Crea  Commonly known as:  non-specified  Apply 1 application topically 3 (three) times daily. Apply to right ear.     BOTOX IJ  Inject 400 Units as directed.     cetirizine 10 MG tablet  Commonly known as:  ZYRTEC  Take 5 mg by mouth at bedtime.     clonazePAM 1 MG tablet  Commonly known as:  KLONOPIN  Take 1 tablet (1 mg total) by mouth 4  (four) times daily.     FLUoxetine 10 MG capsule  Commonly known as:  PROZAC  Take 10 mg by mouth daily.     HYDROcodone-acetaminophen 5-325 MG per tablet  Commonly known as:  NORCO/VICODIN  Take 1 tablet by mouth every 6 (six) hours as needed (pain in both knees).     ibuprofen 200 MG tablet  Commonly known as:  ADVIL,MOTRIN  Take 400 mg by mouth every 8 (eight) hours as needed for mild pain.     IMITREX 100 MG tablet  Generic drug:  SUMAtriptan  Take 100 mg by mouth every 2 (two) hours as needed for migraine or headache. May repeat in 2 hours if headache persists or recurs.     lamoTRIgine 200 MG tablet  Commonly known as:  LAMICTAL  Take 200 mg by mouth at bedtime.     mineral oil external liquid  Place 1 drop into both ears every Monday, Wednesday, and Friday.     multivitamin tablet  Take 1 tablet by mouth daily.     polyethylene glycol packet  Commonly known as:  MIRALAX / GLYCOLAX  Take 17 g by mouth 2 (two) times daily.     QUEtiapine 100 MG tablet  Commonly known as:  SEROQUEL  Take 100 mg by mouth at bedtime.     rivaroxaban 20 MG Tabs  tablet  Commonly known as:  XARELTO  Take 20 mg by mouth daily with supper.     senna-docusate 8.6-50 MG per tablet  Commonly known as:  Senokot-S  Take 1 tablet by mouth 2 (two) times daily.     tiZANidine 4 MG tablet  Commonly known as:  ZANAFLEX  Take 4 mg by mouth 3 (three) times daily.        The medication list was reviewed and reconciled. All changes or newly prescribed medications were explained.  A complete medication list was provided to the patient/caregiver.  Deetta Perla MD     Patient: [name] [name] MRN: [id number] Sex: female DOB: 1976-08-03  Provider: Deetta Perla, MD Location of Care: [facility]  Note type: {CN NOTE TYPES:210120001}  History of Present Illness: Referral Source: *** History from: {CN REFERRED TL:[id number]} Chief Complaint: ***  [name] [name] is  a 39 y.o. female referred for evaluation of ***.  Review of Systems: {cn system review:210120003}  Past Medical History Past Medical History  Diagnosis Date  . Cerebral palsy   . Pulmonary embolism     Lifetime Coumadin  . Cerebral palsy     Spastic Cerebral palsy, mentally intact  . Contracture, joint, multiple sites     Electric wheelchair, uses left hand to operate chair.   [name] Kitchen Hypertension   . OCD (obsessive compulsive disorder)   . Depression   . Migraine   . [name]   . Dysarthria   . Endometriosis   . History of recurrent UTIs   . GERD (gastroesophageal reflux disease)   . Pulmonary embolism 2011, 01/2011    Will be on lifetime coumadin  . Anemia   . Abdominal pain 01/02/2012    Overview:  Overview:  Per Previous pcp note- Dr. Ta--Pt has history of endometriosis and had DUB in 2011.  She She had u/s that showed normal endometrial stripe.  She had endometrial bx that was wnl.  She has a lot of abd cramping every month.  This cramping was sometimes not relieved with hydrocodone.  She was referred to [facility] for IUD placement to help endometriosis and abd cramping.  [name]  . Macroscopic hematuria 03/10/2013    status post replacement of suprapubic tube 03/04/2013   . HYPERTENSION, BENIGN 01/31/2010    Qualifier: Diagnosis of  By: [name], MD, [name] History of endometriosis 10/2012    OBGYN [facility]: Laparoscopic Endometrial Ablation, TVH,  . Gram positive sepsis 10/24/2014  . HCAP (healthcare-associated pneumonia) 10/25/2014  . Abdominal pain 01/02/2012    Overview:  Overview:  Per Previous pcp note- Dr. Ta--Pt has history of endometriosis and had DUB in 2011.  She She had u/s that showed normal endometrial stripe.  She had endometrial bx that was wnl.  She has a lot of abd cramping every month.  This cramping was sometimes not relieved with hydrocodone.  She was referred to [facility] for IUD placement to help endometriosis and abd cramping.   [name] was placed 04/30/11 by [facility].  Pt still complains of abd cramping, which I am treating with hydrocodone and ultram. Pt reports some initial relief of pain from IUD, but states it has now returned back to similar to previous cramping. Gave patient a shot of Toradol 60 mg IM today-since positive exacerbation of abdominal cramping during past 2 weeks. Physical exam reassuring. Patient may need to return to [facility] for further workup since she is plugged in with  the GYN providers there for further treatment options regarding endometriosis and abdominal cramping. The dysfunctional uterine bleeding now well contr  . Acute respiratory failure with hypoxia   . Cellulitis 12/22/2014  . Disease of female genital organs 10/24/2010    Overview:  Overview:  Pt has history of endometriosis and had DUB in 2011.  She had u/s that showed normal endometrial stripe.  She had endometrial bx that was wnl.  She has a lot of abd cramping every month.  This cramping was sometimes not relieved with hydrocodone.  She was referred to [facility] for IUD placement to help endometriosis and abd cramping.  [name] was placed 04/30/11 by [facility].  Pt still complains of abd cramping, which was treated with hydrocodone and ultram.   Last seen at [facility] by OB/GYN 12/24/11, given Depo Lupron injection.  Ordering CT scan to r/o additional cause of abdominal pain (pt with known endometriosis and will not tolerate vaginal U/S, pain not improved s/p [name] or with Lupron injection-- only caused hot flashes and amenorrhea, no pain relief).  Also made referral to pain clinic for better pain control.  Do not think hysterectomy would help with pain, and concern for contamination of baclofen pump if did proceed with surgery.    8/5/13[name] OB/GYN: Pt was seen by   . Dyspnea and respiratory abnormality 02/04/2011    Overview:  Overview:  Pt has history of recurrent PE and is on chronic coumadin.  Pt has been complaining of dyspnea that she  describes as chest tightness.  She has been to the ER multiple times for this.  Usually she gets CTA and serial CE.  She has had at least 6 CTA in a period of since 2011.  She also has an O2 requirement. It is unclear why pt feels dyspneic, complains of chest tightness, or has O2 requirement.  I thought that her complaint may be from deconditioning and referred her to PT.  Unfortunately Medicaid only pays for 4 PT visits.  Pt should be doing these exercises at home, but she has not.  I have referred her to [facility], and she has been seen by [name].  He did not understand the etiology of her dyspnea and O2 requirement.  He will continue to follow her.   Last Assessment & Plan:  Pt presents with concerns for chest tightness and dyspnea.  She was seen in ED yesterday and discharged home.  While there she had a CT chest 04/20/11 showed 1.  Multifocal degradation as detailed above.  N  . Gross hematuria 03/12/2013    Overview:  Overview:  status post replacement of suprapubic tube 03/04/2013  Last Assessment & Plan:  Recent urine culture collected in ED on 5/4 did not result in significant growth. Moreover, patient has been asymptomatic with normal WBC, no fever and no true symptoms making symptomatic UTI less likely.  Will continue to monitor for any sign of infection.  Patient's xarelto had been held during her recent hospitalization due to hematuria. This has since been re-started. Monitor bleeding.    [name] Kitchen History of anticoagulant therapy 10/21/2012  . Infantile cerebral palsy 01/29/2007    Overview:  She has spastic CP.  She is mentally intact.  She is wheelchair bound.  She is wheelchair bound for ambulation      . Intracervical pessary 05/07/2011    Overview:  Overview:  Placed by [facility] GYN 04/30/11 to treat DUB and Endometriosis.  She is seen at [facility] clinic  at [facility] but was considered a poor surgical candidate and referred her to [facility].  For DUB she had pelvic ultrasound that showed thin stripe and she had  endometrial Bx by Dr Jennette Kettle in GYN clinic, which was negative.     . Parapneumonic effusion 10/25/2014  . Presence of intrathecal baclofen pump 03/07/2014    R LQ. Insertion T10.    [name] Kitchen Psychiatric illness 10/31/2014  . Recurrent pulmonary embolism 05/03/2011    Pt has history of recurrent PE and is on chronic coumadin.  Pt has been complaining of dyspnea that she describes as chest tightness.  She has been to the ER multiple times for this.  Usually she gets CTA and serial CE.  She has had at least 6 CTA in a period of since 2011.  She also has an O2 requirement. It is unclear why pt feels dyspneic, complains of chest tightness, or has O2 requirement.  I thought that her complaint may be from deconditioning and referred her to PT.  Unfortunately Medicaid only pays for 4 PT visits.  Pt should be doing these exercises at home, but she has not.  I have referred her to [facility], and she has been seen by [name].  He did not understand the etiology of her dyspnea and O2 requirement.  He will continue to follow her.     . Seborrheic keratosis 01/18/2011  . Spasticity 01/05/2014   Hospitalizations: {yes no:314532}, Head Injury: {yes no:314532}, Nervous System Infections: {yes no:314532}, Immunizations up to date: {yes no:314532}  ***  Birth History *** lbs. *** oz. infant born at *** weeks gestational age to a *** year old g *** p *** *** *** *** female. Gestation was {Complicated/Uncomplicated Pregnancy:20185} Mother received {CN Delivery analgesics:210120005}  {method of delivery:313099} Nursery Course was {Complicated/Uncomplicated:20316} Growth and Development was {cn recall:210120004}  Behavior History {Symptoms; behavioral problems:18883}  Surgical History Past Surgical History  Procedure Laterality Date  . Colposcopy  06/2000  . Cesarean section      x 2  . Tubal ligation  2003  . Carpal tunnel release  08/2008    Dr Teressa Senter  . Wrist surgery  06/2010    Dr Dierdre Searles, hand surgeon, [name]  .  Baclofen pump refill      x 3 times  . Appendectomy    . Intrauterine device insertion  04/30/11    Inserted by [facility] GYN for endometriosis  . Laparoscopically assisted vag hysterectomy  10/27/2012  . Multiple extractions with alveoloplasty Bilateral 07/19/2013    Procedure: EXTRACTIONS #4, 8,11,91,47;  Surgeon: Francene Finders, DDS;  Location[facility] OR;  Service: Oral Surgery;  Laterality: Bilateral;  . Pain pump implantation N/A 03/07/2014    Procedure: baclofen pump revision/replacement and Catheter connection replacement;  Surgeon: Cristi Loron, MD;  Location: [facility] NEURO ORS;  Service: Neurosurgery;  Laterality: N/A;  baclofen pump revision/replacement and Catheter connection replacement  . Programable baclofen pump revision  03/17/14    Battery Replacement     Family History family history includes Alzheimer's disease in her paternal grandfather; Asthma in her father; Breast cancer in her paternal grandmother; Cancer in her paternal grandmother; Colon cancer in her maternal grandmother; Heart attack in her maternal grandfather. Family history is negative for migraines, seizures, intellectual disabilities, blindness, deafness, birth defects, chromosomal disorder, or autism.  Social History History   Social History  . Marital Status: Divorced    Spouse Name: N/A  . Number of Children: 2  . Years of Education: college  Occupational History  .    [name] Kitchen UNEMPLOYED    Social History Main Topics  . Smoking status: Never Smoker   . Smokeless tobacco: Never Used  . Alcohol Use: 0.6 oz/week    1 Glasses of wine per week     Comment: Drinks alcohol once a month.  . Drug Use: No  . Sexual Activity: No   Other Topics Concern  . None   Social History Narrative   Ailene Ards, who has schizophrenia, MR, is father of daughter.  Homero Fellers and pt are no longer together.  Homero Fellers does not see [name]. Daughter is 66 yo, Stage manager, lives with pt.        [name] [phone]), lives with pt's  parents.      Needs assistance with ADL's and IADL's--has personal care services 20 hrs/wk;       No tobacco, EtOH, drugs.      **Patient very concerned about her kids being taken from her.      **Case Manager: Jack Quarto [phone]      Patient lives [place]   Educational level {Misc; education levels:33222} School Attending: *** {school level:210120006} school. Occupation: Consulting civil engineer *** Living with {companion:315061}  Hobbies/Interest: {NONE OZHYQMVHQ:46962} School comments ***  Allergies Allergies  Allergen Reactions  . Morphine Dermatitis    Skin turned red    Physical Exam BP 94/64 mmHg  Pulse 84  Ht   Wt   LMP 04/05/2011  ***   Assessment   Discussion   Plan    Medication List       This list is accurate as of: 05/12/15 11:59 PM.  Always use your most recent med list.               acetaminophen 325 MG tablet  Commonly known as:  TYLENOL  Take 650 mg by mouth every 8 (eight) hours.     acetic acid 2 % otic solution  Commonly known as:  VOSOL  Place 4 drops into both ears once a week. On Fridays.     ARIPIPRAZOLE PO  Take 1 mg by mouth daily.     baclofen 10 MG tablet  Commonly known as:  LIORESAL  Take 20 mg by mouth 4 (four) times daily.     barrier cream Crea  Commonly known as:  non-specified  Apply 1 application topically 3 (three) times daily. Apply to right ear.     BOTOX IJ  Inject 400 Units as directed.     cetirizine 10 MG tablet  Commonly known as:  ZYRTEC  Take 5 mg by mouth at bedtime.     clonazePAM 1 MG tablet  Commonly known as:  KLONOPIN  Take 1 tablet (1 mg total) by mouth 4 (four) times daily.     FLUoxetine 10 MG capsule  Commonly known as:  PROZAC  Take 10 mg by mouth daily.     HYDROcodone-acetaminophen 5-325 MG per tablet  Commonly known as:  NORCO/VICODIN  Take 1 tablet by mouth every 6 (six) hours as needed (pain in both knees).     ibuprofen 200 MG tablet  Commonly known as:  ADVIL,MOTRIN    Take 400 mg by mouth every 8 (eight) hours as needed for mild pain.     IMITREX 100 MG tablet  Generic drug:  SUMAtriptan  Take 100 mg by mouth every 2 (two) hours as needed for migraine or headache. May repeat in 2 hours if headache persists or recurs.     lamoTRIgine  200 MG tablet  Commonly known as:  LAMICTAL  Take 200 mg by mouth at bedtime.     mineral oil external liquid  Place 1 drop into both ears every Monday, Wednesday, and Friday.     multivitamin tablet  Take 1 tablet by mouth daily.     polyethylene glycol packet  Commonly known as:  MIRALAX / GLYCOLAX  Take 17 g by mouth 2 (two) times daily.     QUEtiapine 100 MG tablet  Commonly known as:  SEROQUEL  Take 100 mg by mouth at bedtime.     rivaroxaban 20 MG Tabs tablet  Commonly known as:  XARELTO  Take 20 mg by mouth daily with supper.     senna-docusate 8.6-50 MG per tablet  Commonly known as:  Senokot-S  Take 1 tablet by mouth 2 (two) times daily.     tiZANidine 4 MG tablet  Commonly known as:  ZANAFLEX  Take 4 mg by mouth 3 (three) times daily.        The medication list was reviewed and reconciled. All changes or newly prescribed medications were explained.  A complete medication list was provided to the patient/caregiver.  Deetta Perla MD     Patient: [name] [name] MRN: [id number] Sex: female DOB: [birth year]  Provider: Deetta Perla, MD Location of Care: [facility]rology  Note type: {CN NOTE TYPES:210120001}  History of Present Illness: Referral Source: *** History from: {CN REFERRED VF:[id number]} Chief Complaint: ***  [name] [name] is a 39 y.o. female referred for evaluation of ***.  Review of Systems: {cn system review:210120003}  Past Medical History Past Medical History  Diagnosis Date  . Cerebral palsy   . Pulmonary embolism     Lifetime Coumadin  . Cerebral palsy     Spastic Cerebral palsy, mentally intact  . Contracture, joint, multiple sites      Electric wheelchair, uses left hand to operate chair.   [name] Kitchen Hypertension   . OCD (obsessive compulsive disorder)   . Depression   . Migraine   . [name]   . Dysarthria   . Endometriosis   . History of recurrent UTIs   . GERD (gastroesophageal reflux disease)   . Pulmonary embolism 2011, 01/2011    Will be on lifetime coumadin  . Anemia   . Abdominal pain 01/02/2012    Overview:  Overview:  Per Previous pcp note- Dr. Ta--Pt has history of endometriosis and had DUB in 2011.  She She had u/s that showed normal endometrial stripe.  She had endometrial bx that was wnl.  She has a lot of abd cramping every month.  This cramping was sometimes not relieved with hydrocodone.  She was referred to [facility] for IUD placement to help endometriosis and abd cramping.  [name]  . Macroscopic hematuria 03/10/2013    status post replacement of suprapubic tube 03/04/2013   . HYPERTENSION, BENIGN 01/31/2010    Qualifier: Diagnosis of  By: [name], MD, [name] History of endometriosis 10/2012    OBGYN [facility]: Laparoscopic Endometrial Ablation, TVH,  . Gram positive sepsis 10/24/2014  . HCAP (healthcare-associated pneumonia) 10/25/2014  . Abdominal pain 01/02/2012    Overview:  Overview:  Per Previous pcp note- Dr. Ta--Pt has history of endometriosis and had DUB in 2011.  She She had u/s that showed normal endometrial stripe.  She had endometrial bx that was wnl.  She has a lot of abd cramping every month.  This cramping  was sometimes not relieved with hydrocodone.  She was referred to [facility] for IUD placement to help endometriosis and abd cramping.  [name] was placed 04/30/11 by [facility].  Pt still complains of abd cramping, which I am treating with hydrocodone and ultram. Pt reports some initial relief of pain from IUD, but states it has now returned back to similar to previous cramping. Gave patient a shot of Toradol 60 mg IM today-since positive exacerbation of abdominal cramping  during past 2 weeks. Physical exam reassuring. Patient may need to return to [facility] for further workup since she is plugged in with the GYN providers there for further treatment options regarding endometriosis and abdominal cramping. The dysfunctional uterine bleeding now well contr  . Acute respiratory failure with hypoxia   . Cellulitis 12/22/2014  . Disease of female genital organs 10/24/2010    Overview:  Overview:  Pt has history of endometriosis and had DUB in 2011.  She had u/s that showed normal endometrial stripe.  She had endometrial bx that was wnl.  She has a lot of abd cramping every month.  This cramping was sometimes not relieved with hydrocodone.  She was referred to [facility] for IUD placement to help endometriosis and abd cramping.  [name] was placed 04/30/11 by [facility].  Pt still complains of abd cramping, which was treated with hydrocodone and ultram.   Last seen at [facility] by OB/GYN 12/24/11, given Depo Lupron injection.  Ordering CT scan to r/o additional cause of abdominal pain (pt with known endometriosis and will not tolerate vaginal U/S, pain not improved s/p [name] or with Lupron injection-- only caused hot flashes and amenorrhea, no pain relief).  Also made referral to pain clinic for better pain control.  Do not think hysterectomy would help with pain, and concern for contamination of baclofen pump if did proceed with surgery.    8/5/13[name]: Pt was seen by   . Dyspnea and respiratory abnormality 02/04/2011    Overview:  Overview:  Pt has history of recurrent PE and is on chronic coumadin.  Pt has been complaining of dyspnea that she describes as chest tightness.  She has been to the ER multiple times for this.  Usually she gets CTA and serial CE.  She has had at least 6 CTA in a period of since 2011.  She also has an O2 requirement. It is unclear why pt feels dyspneic, complains of chest tightness, or has O2 requirement.  I thought that her complaint may be from  deconditioning and referred her to PT.  Unfortunately Medicaid only pays for 4 PT visits.  Pt should be doing these exercises at home, but she has not.  I have referred her to [facility], and she has been seen by [name].  He did not understand the etiology of her dyspnea and O2 requirement.  He will continue to follow her.   Last Assessment & Plan:  Pt presents with concerns for chest tightness and dyspnea.  She was seen in ED yesterday and discharged home.  While there she had a CT chest 04/20/11 showed 1.  Multifocal degradation as detailed above.  N  . Gross hematuria 03/12/2013    Overview:  Overview:  status post replacement of suprapubic tube 03/04/2013  Last Assessment & Plan:  Recent urine culture collected in ED on 5/4 did not result in significant growth. Moreover, patient has been asymptomatic with normal WBC, no fever and no true symptoms making symptomatic UTI less likely.  Will continue to monitor for any sign of infection.  Patient's xarelto had been held during her recent hospitalization due to hematuria. This has since been re-started. Monitor bleeding.    [name] Kitchen History of anticoagulant therapy 10/21/2012  . Infantile cerebral palsy 01/29/2007    Overview:  She has spastic CP.  She is mentally intact.  She is wheelchair bound.  She is wheelchair bound for ambulation      . Intracervical pessary 05/07/2011    Overview:  Overview:  Placed by [facility]YN 04/30/11 to treat DUB and Endometriosis.  She is seen at GYN clinic at [facility] but was considered a poor surgical candidate and referred her to [facility].  For DUB she had pelvic ultrasound that showed thin stripe and she had endometrial Bx by Dr Jennette Kettle in GYN clinic, which was negative.     . Parapneumonic effusion 10/25/2014  . Presence of intrathecal baclofen pump 03/07/2014    R LQ. Insertion T10.    [name] Kitchen Psychiatric illness 10/31/2014  . Recurrent pulmonary embolism 05/03/2011    Pt has history of recurrent PE and is on chronic coumadin.  Pt has been  complaining of dyspnea that she describes as chest tightness.  She has been to the ER multiple times for this.  Usually she gets CTA and serial CE.  She has had at least 6 CTA in a period of since 2011.  She also has an O2 requirement. It is unclear why pt feels dyspneic, complains of chest tightness, or has O2 requirement.  I thought that her complaint may be from deconditioning and referred her to PT.  Unfortunately Medicaid only pays for 4 PT visits.  Pt should be doing these exercises at home, but she has not.  I have referred her to [facility], and she has been seen by [name].  He did not understand the etiology of her dyspnea and O2 requirement.  He will continue to follow her.     . Seborrheic keratosis 01/18/2011  . Spasticity 01/05/2014   Hospitalizations: {yes no:314532}, Head Injury: {yes no:314532}, Nervous System Infections: {yes no:314532}, Immunizations up to date: {yes no:314532}  ***  Birth History *** lbs. *** oz. infant born at *** weeks gestational age to a *** year old g *** p *** *** *** *** female. Gestation was {Complicated/Uncomplicated Pregnancy:20185} Mother received {CN Delivery analgesics:210120005}  {method of delivery:313099} Nursery Course was {Complicated/Uncomplicated:20316} Growth and Development was {cn recall:210120004}  Behavior History {Symptoms; behavioral problems:18883}  Surgical History Past Surgical History  Procedure Laterality Date  . Colposcopy  06/2000  . Cesarean section      x 2  . Tubal ligation  2003  . Carpal tunnel release  08/2008    Dr Teressa Senter  . Wrist surgery  06/2010    Dr Dierdre Searles, hand surgeon, [name]  . Baclofen pump refill      x 3 times  . Appendectomy    . Intrauterine device insertion  04/30/11    Inserted by [facility] GYN for endometriosis  . Laparoscopically assisted vag hysterectomy  10/27/2012  . Multiple extractions with alveoloplasty Bilateral 07/19/2013    Procedure: EXTRACTIONS #4, 0,98,11,91;  Surgeon: Francene Finders, DDS;  Location: [facility] OR;  Service: Oral Surgery;  Laterality: Bilateral;  . Pain pump implantation N/A 03/07/2014    Procedure: baclofen pump revision/replacement and Catheter connection replacement;  Surgeon: Cristi Loron, MD;  Location: [facility] ORS;  Service: Neurosurgery;  Laterality: N/A;  baclofen pump revision/replacement and Catheter  connection replacement  . Programable baclofen pump revision  03/17/14    Battery Replacement     Family History family history includes Alzheimer's disease in her paternal grandfather; Asthma in her father; Breast cancer in her paternal grandmother; Cancer in her paternal grandmother; Colon cancer in her maternal grandmother; Heart attack in her maternal grandfather. Family history is negative for migraines, seizures, intellectual disabilities, blindness, deafness, birth defects, chromosomal disorder, or autism.  Social History History   Social History  . Marital Status: Divorced    Spouse Name: N/A  . Number of Children: 2  . Years of Education: college   Occupational History  .    [name] Kitchen UNEMPLOYED    Social History Main Topics  . Smoking status: Never Smoker   . Smokeless tobacco: Never Used  . Alcohol Use: 0.6 oz/week    1 Glasses of wine per week     Comment: Drinks alcohol once a month.  . Drug Use: No  . Sexual Activity: No   Other Topics Concern  . None   Social History Narrative   Ailene Ards, who has schizophrenia, MR, is father of daughter.  Homero Fellers and pt are no longer together.  Homero Fellers does not see [name]. Daughter is 31 yo, Stage manager, lives with pt.        [name] [phone]), lives with pt's parents.      Needs assistance with ADL's and IADL's--has personal care services 20 hrs/wk;       No tobacco, EtOH, drugs.      **Patient very concerned about her kids being taken from her.      **Case Manager: Jack Quarto [phone]      Patient lives [place]   Educational level {Misc; education levels:33222} School  Attending: *** {school level:210120006} school. Occupation: Consulting civil engineer *** Living with {companion:315061}  Hobbies/Interest: {NONE XBJYNWGNF:62130} School comments ***  Allergies Allergies  Allergen Reactions  . Morphine Dermatitis    Skin turned red    Physical Exam BP 94/64 mmHg  Pulse 84  Ht   Wt   LMP 04/05/2011  ***   Assessment   Discussion   Plan    Medication List       This list is accurate as of: 05/12/15 11:59 PM.  Always use your most recent med list.               acetaminophen 325 MG tablet  Commonly known as:  TYLENOL  Take 650 mg by mouth every 8 (eight) hours.     acetic acid 2 % otic solution  Commonly known as:  VOSOL  Place 4 drops into both ears once a week. On Fridays.     ARIPIPRAZOLE PO  Take 1 mg by mouth daily.     baclofen 10 MG tablet  Commonly known as:  LIORESAL  Take 20 mg by mouth 4 (four) times daily.     barrier cream Crea  Commonly known as:  non-specified  Apply 1 application topically 3 (three) times daily. Apply to right ear.     BOTOX IJ  Inject 400 Units as directed.     cetirizine 10 MG tablet  Commonly known as:  ZYRTEC  Take 5 mg by mouth at bedtime.     clonazePAM 1 MG tablet  Commonly known as:  KLONOPIN  Take 1 tablet (1 mg total) by mouth 4 (four) times daily.     FLUoxetine 10 MG capsule  Commonly known as:  PROZAC  Take 10 mg by mouth  daily.     HYDROcodone-acetaminophen 5-325 MG per tablet  Commonly known as:  NORCO/VICODIN  Take 1 tablet by mouth every 6 (six) hours as needed (pain in both knees).     ibuprofen 200 MG tablet  Commonly known as:  ADVIL,MOTRIN  Take 400 mg by mouth every 8 (eight) hours as needed for mild pain.     IMITREX 100 MG tablet  Generic drug:  SUMAtriptan  Take 100 mg by mouth every 2 (two) hours as needed for migraine or headache. May repeat in 2 hours if headache persists or recurs.     lamoTRIgine 200 MG tablet  Commonly known as:  LAMICTAL  Take 200 mg by  mouth at bedtime.     mineral oil external liquid  Place 1 drop into both ears every Monday, Wednesday, and Friday.     multivitamin tablet  Take 1 tablet by mouth daily.     polyethylene glycol packet  Commonly known as:  MIRALAX / GLYCOLAX  Take 17 g by mouth 2 (two) times daily.     QUEtiapine 100 MG tablet  Commonly known as:  SEROQUEL  Take 100 mg by mouth at bedtime.     rivaroxaban 20 MG Tabs tablet  Commonly known as:  XARELTO  Take 20 mg by mouth daily with supper.     senna-docusate 8.6-50 MG per tablet  Commonly known as:  Senokot-S  Take 1 tablet by mouth 2 (two) times daily.     tiZANidine 4 MG tablet  Commonly known as:  ZANAFLEX  Take 4 mg by mouth 3 (three) times daily.        The medication list was reviewed and reconciled. All changes or newly prescribed medications were explained.  A complete medication list was provided to the patient/caregiver.  Deetta Perla MD

## 2015-05-15 ENCOUNTER — Other Ambulatory Visit: Payer: Self-pay | Admitting: Family Medicine

## 2015-05-15 DIAGNOSIS — M1990 Unspecified osteoarthritis, unspecified site: Secondary | ICD-10-CM

## 2015-05-15 DIAGNOSIS — G894 Chronic pain syndrome: Secondary | ICD-10-CM

## 2015-05-15 MED ORDER — HYDROCODONE-ACETAMINOPHEN 5-325 MG PO TABS: 1.0000 | ORAL_TABLET | Freq: Four times a day (QID) | ORAL | Status: DC | PRN

## 2015-05-16 ENCOUNTER — Non-Acute Institutional Stay (INDEPENDENT_AMBULATORY_CARE_PROVIDER_SITE_OTHER): Payer: Medicaid Other | Admitting: Family Medicine

## 2015-05-16 ENCOUNTER — Encounter: Payer: Self-pay | Admitting: Family Medicine

## 2015-05-16 DIAGNOSIS — E042 Nontoxic multinodular goiter: Secondary | ICD-10-CM

## 2015-05-16 DIAGNOSIS — R252 Cramp and spasm: Secondary | ICD-10-CM

## 2015-05-16 DIAGNOSIS — Z9359 Other cystostomy status: Secondary | ICD-10-CM

## 2015-05-16 DIAGNOSIS — G801 Spastic diplegic cerebral palsy: Secondary | ICD-10-CM

## 2015-05-16 DIAGNOSIS — N319 Neuromuscular dysfunction of bladder, unspecified: Secondary | ICD-10-CM

## 2015-05-16 DIAGNOSIS — G809 Cerebral palsy, unspecified: Secondary | ICD-10-CM

## 2015-05-16 DIAGNOSIS — R258 Other abnormal involuntary movements: Secondary | ICD-10-CM

## 2015-05-16 DIAGNOSIS — R404 Transient alteration of awareness: Secondary | ICD-10-CM

## 2015-05-16 NOTE — Assessment & Plan Note (Addendum)
Suprapubic catheter, cystoscopic bladder botox injections by Dr Logan Bores ([facility])

## 2015-05-16 NOTE — Progress Notes (Signed)
See addendum Dr Purvis Sheffield note 05/02/15.

## 2015-05-16 NOTE — Addendum Note (Signed)
Addended byPerley Jain, Dezra Mandella D on: 05/16/2015 04:35 PM   Modules accepted: Level of Service

## 2015-05-16 NOTE — Progress Notes (Signed)
Patient ID: [name] [name], female   DOB: 1975/12/31, 39 y.o.   MRN: [id number] I have seen and examined this patient. I have reviewed labs and imaging results.  I have discussed with Dr Birdie Sons.  I agree with their findings and plans as documented in their Regulatory Visit note. Please note addition below.  I reviewed old notes to retrieve information about prior workup of transient loss of awareness events.   Transient loss of Awareness - Longstanding issue for [name] [name], since at least 2007 when she had an admission for a video EEG over 3 days that captured events without there being corresponding EEG changes.  [name] [name] relates that yesterday she "blacked out" while receiving medication from nurse and then spoke with her boyfriend by phone but has no memory of either.  - Possible history of absence seizures when a child or adolescent that lasted for few months.  Uncertain if she ever received AED for this.  Only recent medication changes were addition of Urispas by Dr Logan Bores ([facility]) for bladder spasms.  - There is no witnessed convulsions or abnormal behaviors by nursing.  - Prior physicians have labeled events as 'fugues" or patient falling a sleep and not recalling this.  - Head CT with Angio with and without contrast in 07/14/2011 showed unremarkable CT angiography intracranial circulation and no acute findings.  - Recommendation: No further diagnostics at this time.  Monitor for increase in frequency of events or impact on function.  Will discuss very gradual reduction in Klonopin daily dose with [name] to see if it impacts recurrence of events.   Spasticity of legs - Congenital Cerebral Palsy - Recent worsening per patient. - Seen early by Dr Sharene Skeans for adjustment of Baclofen pump.  Dr Sharene Skeans increased pump 4%.  He recommended that [name] [name] have frequent passive ROM of bilateral  lower extremities which seems to objectively and subjectively decrease the spasticity in her legs.   [name] [name]  last saw Dr Levert Feinstein [facility]) for Botox injections of upper extremity and left hand in Mid-April 2016.  [name] [name] is to return to see Dr Terrace Arabia for repeat Botox injections in Mid-July 2016. - Continues on oral Baclofen and Tizanidine  - Recommendation: ordered passive ROM bilateral hips, knees and ankle joints for 5 minutes three times a day at [place].  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 - Will check TSH.   -

## 2015-05-23 ENCOUNTER — Telehealth: Payer: Self-pay | Admitting: Family

## 2015-05-23 NOTE — Telephone Encounter (Signed)
I reviewed your note we discussed this situation.  I agree with your management and recommendations.

## 2015-05-23 NOTE — Telephone Encounter (Signed)
[name] [name] left a message saying that she was having blackouts and needed a call back. When I called her she was on the [employer] bus and I had difficulty understanding her. I will try again later. TG

## 2015-05-23 NOTE — Telephone Encounter (Signed)
I called [name] [name] back. There was a good deal background noise and conversation, and she was difficult to understand. She said that she had been experiencing blackout spells again over the last 2 weeks. She said that she had brief episodes in which she had lapses in time. She also spoke of concerns about her daughter and problems with behavior. I talked to [name] and explained that when she has had blackout spells as she described in the past, it has been in the setting of emotional upset and anxiety. I attempted to reassure her about the blackout spells and told her that it was her body's way of coping with stress and emotions. She asked for a medication for them and I told her that there was not a medication to make them stop. I encouraged her to talk with her daughter's foster parent and her social worker, both of whom she has access to, regarding her concerns about her daughter's behavior.  [name] had no further questions. TG

## 2015-05-25 ENCOUNTER — Other Ambulatory Visit: Payer: Self-pay | Admitting: Family Medicine

## 2015-05-25 MED ORDER — CLONAZEPAM 1 MG PO TABS: 1.0000 mg | ORAL_TABLET | Freq: Four times a day (QID) | ORAL | Status: DC

## 2015-05-27 ENCOUNTER — Encounter: Payer: Self-pay | Admitting: Family Medicine

## 2015-05-27 LAB — CBC WITH DIFFERENTIAL
HEMATOCRIT: 32 %
Hgb, Plasma: 10.5
MCH: 26.1
MCHC: 32.4
MCV: 80.4
Platelet: 304
RBC: 4.03
RDW: 16.9
WBC: 7

## 2015-06-02 ENCOUNTER — Other Ambulatory Visit: Payer: Self-pay | Admitting: Family Medicine

## 2015-06-07 ENCOUNTER — Telehealth: Payer: Self-pay | Admitting: Family Medicine

## 2015-06-07 NOTE — Telephone Encounter (Addendum)
Received after hours call from [facility]. Nurse stated that patient's suprapubic catheter had not drained urine since starting shift around 3pm however did note that her briefs were wet a few hours ago. Stated that she tried adjusting the catheter but was still unable to drain urine.  Staff [facility] was not concerned about urinary obstruction - patient had no suprapubic fullness and had wet briefs as noted above. Instructed staff that it would safe to wait until tomorrow to have catheter changed but she should go to the ED if there were any concerns about urinary obstruction.  Katina Degreealeb M. Jimmey RalphParker, MD [facility] Resident PGY-2 06/07/2015 11:08 PM

## 2015-06-08 ENCOUNTER — Encounter (HOSPITAL_COMMUNITY): Payer: Self-pay

## 2015-06-08 ENCOUNTER — Inpatient Hospital Stay (HOSPITAL_COMMUNITY)
Admission: EM | Admit: 2015-06-08 | Discharge: 2015-06-14 | DRG: 871 | Disposition: A | Discharge status: Skilled Nursing Facility | Payer: Medicaid Other | Attending: Family Medicine | Admitting: Family Medicine

## 2015-06-08 ENCOUNTER — Encounter: Payer: Self-pay | Admitting: *Deleted

## 2015-06-08 DIAGNOSIS — N99532 Malfunction of other stoma of urinary tract: Secondary | ICD-10-CM | POA: Diagnosis present

## 2015-06-08 DIAGNOSIS — F42 Obsessive-compulsive disorder: Secondary | ICD-10-CM | POA: Diagnosis present

## 2015-06-08 DIAGNOSIS — N319 Neuromuscular dysfunction of bladder, unspecified: Secondary | ICD-10-CM | POA: Diagnosis present

## 2015-06-08 DIAGNOSIS — D638 Anemia in other chronic diseases classified elsewhere: Secondary | ICD-10-CM | POA: Diagnosis present

## 2015-06-08 DIAGNOSIS — F329 Major depressive disorder, single episode, unspecified: Secondary | ICD-10-CM | POA: Diagnosis present

## 2015-06-08 DIAGNOSIS — I10 Essential (primary) hypertension: Secondary | ICD-10-CM | POA: Diagnosis present

## 2015-06-08 DIAGNOSIS — Y838 Other surgical procedures as the cause of abnormal reaction of the patient, or of later complication, without mention of misadventure at the time of the procedure: Secondary | ICD-10-CM | POA: Diagnosis present

## 2015-06-08 DIAGNOSIS — K219 Gastro-esophageal reflux disease without esophagitis: Secondary | ICD-10-CM | POA: Diagnosis present

## 2015-06-08 DIAGNOSIS — Z79899 Other long term (current) drug therapy: Secondary | ICD-10-CM

## 2015-06-08 DIAGNOSIS — A419 Sepsis, unspecified organism: Principal | ICD-10-CM | POA: Diagnosis present

## 2015-06-08 DIAGNOSIS — T83011A Breakdown (mechanical) of indwelling urethral catheter, initial encounter: Secondary | ICD-10-CM | POA: Insufficient documentation

## 2015-06-08 DIAGNOSIS — F419 Anxiety disorder, unspecified: Secondary | ICD-10-CM | POA: Diagnosis present

## 2015-06-08 DIAGNOSIS — G801 Spastic diplegic cerebral palsy: Secondary | ICD-10-CM | POA: Diagnosis present

## 2015-06-08 DIAGNOSIS — Z7901 Long term (current) use of anticoagulants: Secondary | ICD-10-CM

## 2015-06-08 DIAGNOSIS — N390 Urinary tract infection, site not specified: Secondary | ICD-10-CM | POA: Diagnosis present

## 2015-06-08 DIAGNOSIS — M2450 Contracture, unspecified joint: Secondary | ICD-10-CM | POA: Diagnosis present

## 2015-06-08 DIAGNOSIS — N3000 Acute cystitis without hematuria: Secondary | ICD-10-CM | POA: Diagnosis present

## 2015-06-08 DIAGNOSIS — R6520 Severe sepsis without septic shock: Secondary | ICD-10-CM

## 2015-06-08 DIAGNOSIS — I272 Other secondary pulmonary hypertension: Secondary | ICD-10-CM | POA: Diagnosis present

## 2015-06-08 DIAGNOSIS — R109 Unspecified abdominal pain: Secondary | ICD-10-CM | POA: Insufficient documentation

## 2015-06-08 DIAGNOSIS — R471 Dysarthria and anarthria: Secondary | ICD-10-CM | POA: Diagnosis present

## 2015-06-08 DIAGNOSIS — L89151 Pressure ulcer of sacral region, stage 1: Secondary | ICD-10-CM | POA: Diagnosis present

## 2015-06-08 DIAGNOSIS — Z86711 Personal history of pulmonary embolism: Secondary | ICD-10-CM

## 2015-06-08 DIAGNOSIS — M1990 Unspecified osteoarthritis, unspecified site: Secondary | ICD-10-CM | POA: Diagnosis present

## 2015-06-08 DIAGNOSIS — N10 Acute tubulo-interstitial nephritis: Secondary | ICD-10-CM | POA: Insufficient documentation

## 2015-06-08 DIAGNOSIS — N3001 Acute cystitis with hematuria: Secondary | ICD-10-CM

## 2015-06-08 DIAGNOSIS — Z79891 Long term (current) use of opiate analgesic: Secondary | ICD-10-CM

## 2015-06-08 DIAGNOSIS — R6521 Severe sepsis with septic shock: Secondary | ICD-10-CM | POA: Diagnosis present

## 2015-06-08 LAB — BASIC METABOLIC PANEL
ANION GAP: 7 (ref 5–15)
BUN: 13 mg/dL (ref 6–20)
CALCIUM: 8.5 mg/dL — AB (ref 8.9–10.3)
CO2: 28 mmol/L (ref 22–32)
Chloride: 101 mmol/L (ref 101–111)
Creatinine, Ser: 0.54 mg/dL (ref 0.44–1.00)
Glucose, Bld: 107 mg/dL — ABNORMAL HIGH (ref 65–99)
POTASSIUM: 4.1 mmol/L (ref 3.5–5.1)
SODIUM: 136 mmol/L (ref 135–145)

## 2015-06-08 LAB — CBC
HEMATOCRIT: 32.2 % — AB (ref 36.0–46.0)
HEMOGLOBIN: 10.1 g/dL — AB (ref 12.0–15.0)
MCH: 26.2 pg (ref 26.0–34.0)
MCHC: 31.4 g/dL (ref 30.0–36.0)
MCV: 83.4 fL (ref 78.0–100.0)
Platelets: 310 10*3/uL (ref 150–400)
RBC: 3.86 MIL/uL — ABNORMAL LOW (ref 3.87–5.11)
RDW: 14.5 % (ref 11.5–15.5)
WBC: 7.6 10*3/uL (ref 4.0–10.5)

## 2015-06-08 LAB — I-STAT CG4 LACTIC ACID, ED: LACTIC ACID, VENOUS: 0.92 mmol/L (ref 0.5–2.0)

## 2015-06-08 MED ORDER — PIPERACILLIN-TAZOBACTAM 3.375 G IVPB 30 MIN
3.3750 g | Freq: Once | INTRAVENOUS | Status: AC
  Administered 2015-06-08: 3.375 g via INTRAVENOUS
  Filled 2015-06-08: qty 50

## 2015-06-08 MED ORDER — VANCOMYCIN HCL IN DEXTROSE 1-5 GM/200ML-% IV SOLN
1000.0000 mg | Freq: Once | INTRAVENOUS | Status: AC
  Administered 2015-06-08: 1000 mg via INTRAVENOUS
  Filled 2015-06-08: qty 200

## 2015-06-08 MED ORDER — SODIUM CHLORIDE 0.9 % IV SOLN
Freq: Once | INTRAVENOUS | Status: AC
  Administered 2015-06-08: 23:00:00 via INTRAVENOUS

## 2015-06-08 MED ORDER — NOREPINEPHRINE BITARTRATE 1 MG/ML IV SOLN
2.0000 ug/min | INTRAVENOUS | Status: DC
  Administered 2015-06-08: 2 ug/min via INTRAVENOUS
  Administered 2015-06-09: 5 ug/min via INTRAVENOUS
  Filled 2015-06-08 (×2): qty 4

## 2015-06-08 MED ORDER — SODIUM CHLORIDE 0.9 % IV BOLUS (SEPSIS)
1000.0000 mL | Freq: Once | INTRAVENOUS | Status: AC
  Administered 2015-06-08: 1000 mL via INTRAVENOUS

## 2015-06-08 NOTE — ED Notes (Signed)
Dr. Gwendolyn GrantWalden aware that patients bp is very low, 2 more liters of fluid started at this time. Pts head of bed lowered. Pt alert, responsive.

## 2015-06-08 NOTE — ED Notes (Signed)
Per EMS, pt presents from [facility] with a complaint of catheter pain. PEr the facility the pt normally has some catheter pain, but the pt's suprapubic catheter pain became much worse sometime yesterday. Catheter was changed last night at 2300 and the urine was extremely dark and contained some blood. EMS VS 110/70, HR 84, CBG 99. Pt is on RA. Pt stable upon arrival. Pt alert and oriented x 4.

## 2015-06-08 NOTE — ED Notes (Signed)
Dr. Gwendolyn GrantWalden at bedside attempted to place ultrasound guided iv

## 2015-06-09 DIAGNOSIS — N3000 Acute cystitis without hematuria: Secondary | ICD-10-CM | POA: Diagnosis not present

## 2015-06-09 DIAGNOSIS — R6521 Severe sepsis with septic shock: Secondary | ICD-10-CM | POA: Diagnosis not present

## 2015-06-09 DIAGNOSIS — I272 Other secondary pulmonary hypertension: Secondary | ICD-10-CM | POA: Diagnosis present

## 2015-06-09 DIAGNOSIS — N99532 Malfunction of other stoma of urinary tract: Secondary | ICD-10-CM | POA: Diagnosis present

## 2015-06-09 DIAGNOSIS — I10 Essential (primary) hypertension: Secondary | ICD-10-CM | POA: Diagnosis present

## 2015-06-09 DIAGNOSIS — R1084 Generalized abdominal pain: Secondary | ICD-10-CM | POA: Diagnosis not present

## 2015-06-09 DIAGNOSIS — R6520 Severe sepsis without septic shock: Secondary | ICD-10-CM

## 2015-06-09 DIAGNOSIS — Z593 Problems related to living in residential institution: Secondary | ICD-10-CM | POA: Diagnosis not present

## 2015-06-09 DIAGNOSIS — G809 Cerebral palsy, unspecified: Secondary | ICD-10-CM | POA: Diagnosis not present

## 2015-06-09 DIAGNOSIS — F329 Major depressive disorder, single episode, unspecified: Secondary | ICD-10-CM | POA: Diagnosis present

## 2015-06-09 DIAGNOSIS — T83018D Breakdown (mechanical) of other indwelling urethral catheter, subsequent encounter: Secondary | ICD-10-CM | POA: Diagnosis not present

## 2015-06-09 DIAGNOSIS — T8351XD Infection and inflammatory reaction due to indwelling urinary catheter, subsequent encounter: Secondary | ICD-10-CM | POA: Diagnosis not present

## 2015-06-09 DIAGNOSIS — L89151 Pressure ulcer of sacral region, stage 1: Secondary | ICD-10-CM | POA: Diagnosis present

## 2015-06-09 DIAGNOSIS — Z9359 Other cystostomy status: Secondary | ICD-10-CM | POA: Diagnosis not present

## 2015-06-09 DIAGNOSIS — A419 Sepsis, unspecified organism: Secondary | ICD-10-CM | POA: Diagnosis present

## 2015-06-09 DIAGNOSIS — Z86711 Personal history of pulmonary embolism: Secondary | ICD-10-CM | POA: Diagnosis not present

## 2015-06-09 DIAGNOSIS — M1990 Unspecified osteoarthritis, unspecified site: Secondary | ICD-10-CM | POA: Diagnosis present

## 2015-06-09 DIAGNOSIS — T83018A Breakdown (mechanical) of other indwelling urethral catheter, initial encounter: Secondary | ICD-10-CM | POA: Diagnosis not present

## 2015-06-09 DIAGNOSIS — I270 Primary pulmonary hypertension: Secondary | ICD-10-CM | POA: Diagnosis not present

## 2015-06-09 DIAGNOSIS — Y838 Other surgical procedures as the cause of abnormal reaction of the patient, or of later complication, without mention of misadventure at the time of the procedure: Secondary | ICD-10-CM | POA: Diagnosis present

## 2015-06-09 DIAGNOSIS — N3001 Acute cystitis with hematuria: Secondary | ICD-10-CM | POA: Diagnosis present

## 2015-06-09 DIAGNOSIS — N10 Acute tubulo-interstitial nephritis: Secondary | ICD-10-CM | POA: Diagnosis not present

## 2015-06-09 DIAGNOSIS — F419 Anxiety disorder, unspecified: Secondary | ICD-10-CM | POA: Diagnosis present

## 2015-06-09 DIAGNOSIS — R471 Dysarthria and anarthria: Secondary | ICD-10-CM | POA: Diagnosis present

## 2015-06-09 DIAGNOSIS — Z79899 Other long term (current) drug therapy: Secondary | ICD-10-CM | POA: Diagnosis not present

## 2015-06-09 DIAGNOSIS — M2450 Contracture, unspecified joint: Secondary | ICD-10-CM | POA: Diagnosis present

## 2015-06-09 DIAGNOSIS — D638 Anemia in other chronic diseases classified elsewhere: Secondary | ICD-10-CM | POA: Diagnosis present

## 2015-06-09 DIAGNOSIS — N319 Neuromuscular dysfunction of bladder, unspecified: Secondary | ICD-10-CM | POA: Diagnosis present

## 2015-06-09 DIAGNOSIS — F42 Obsessive-compulsive disorder: Secondary | ICD-10-CM | POA: Diagnosis present

## 2015-06-09 DIAGNOSIS — Z79891 Long term (current) use of opiate analgesic: Secondary | ICD-10-CM | POA: Diagnosis not present

## 2015-06-09 DIAGNOSIS — Z7901 Long term (current) use of anticoagulants: Secondary | ICD-10-CM | POA: Diagnosis not present

## 2015-06-09 DIAGNOSIS — I2699 Other pulmonary embolism without acute cor pulmonale: Secondary | ICD-10-CM | POA: Diagnosis not present

## 2015-06-09 DIAGNOSIS — T8351XA Infection and inflammatory reaction due to indwelling urinary catheter, initial encounter: Secondary | ICD-10-CM | POA: Diagnosis not present

## 2015-06-09 DIAGNOSIS — K219 Gastro-esophageal reflux disease without esophagitis: Secondary | ICD-10-CM | POA: Diagnosis present

## 2015-06-09 DIAGNOSIS — N390 Urinary tract infection, site not specified: Secondary | ICD-10-CM | POA: Diagnosis present

## 2015-06-09 DIAGNOSIS — G801 Spastic diplegic cerebral palsy: Secondary | ICD-10-CM | POA: Diagnosis present

## 2015-06-09 LAB — URINALYSIS, ROUTINE W REFLEX MICROSCOPIC
GLUCOSE, UA: NEGATIVE mg/dL
Ketones, ur: 15 mg/dL — AB
Nitrite: POSITIVE — AB
PH: 7.5 (ref 5.0–8.0)
Protein, ur: >300 mg/dL — AB
Specific Gravity, Urine: 1.027 (ref 1.005–1.030)
Urobilinogen, UA: 1 mg/dL (ref 0.0–1.0)

## 2015-06-09 LAB — MRSA PCR SCREENING: MRSA by PCR: POSITIVE — AB

## 2015-06-09 LAB — URINE MICROSCOPIC-ADD ON

## 2015-06-09 LAB — GLUCOSE, CAPILLARY: Glucose-Capillary: 141 mg/dL — ABNORMAL HIGH (ref 65–99)

## 2015-06-09 MED ORDER — QUETIAPINE FUMARATE 100 MG PO TABS
100.0000 mg | ORAL_TABLET | Freq: Every day | ORAL | Status: DC
  Administered 2015-06-09 – 2015-06-13 (×5): 100 mg via ORAL
  Filled 2015-06-09 (×8): qty 1

## 2015-06-09 MED ORDER — ACETAMINOPHEN 325 MG PO TABS
650.0000 mg | ORAL_TABLET | Freq: Three times a day (TID) | ORAL | Status: DC | PRN
  Administered 2015-06-09: 650 mg via ORAL
  Filled 2015-06-09: qty 2

## 2015-06-09 MED ORDER — SENNOSIDES-DOCUSATE SODIUM 8.6-50 MG PO TABS
1.0000 | ORAL_TABLET | Freq: Two times a day (BID) | ORAL | Status: DC
  Administered 2015-06-09 – 2015-06-14 (×11): 1 via ORAL
  Filled 2015-06-09 (×14): qty 1

## 2015-06-09 MED ORDER — FLUOXETINE HCL 10 MG PO CAPS
10.0000 mg | ORAL_CAPSULE | Freq: Every day | ORAL | Status: DC
  Administered 2015-06-09 – 2015-06-14 (×6): 10 mg via ORAL
  Filled 2015-06-09 (×6): qty 1

## 2015-06-09 MED ORDER — DOUBLE ANTIBIOTIC 500-10000 UNIT/GM EX OINT
1.0000 "application " | TOPICAL_OINTMENT | Freq: Two times a day (BID) | CUTANEOUS | Status: DC
  Administered 2015-06-09 – 2015-06-14 (×11): 1 via TOPICAL
  Filled 2015-06-09 (×18): qty 1

## 2015-06-09 MED ORDER — SODIUM CHLORIDE 0.9 % IV SOLN: 250.0000 mL | INTRAVENOUS | Status: DC | PRN

## 2015-06-09 MED ORDER — RIVAROXABAN 20 MG PO TABS
20.0000 mg | ORAL_TABLET | Freq: Every day | ORAL | Status: DC
  Administered 2015-06-09 – 2015-06-13 (×5): 20 mg via ORAL
  Filled 2015-06-09 (×6): qty 1

## 2015-06-09 MED ORDER — FAMOTIDINE 20 MG PO TABS
20.0000 mg | ORAL_TABLET | Freq: Two times a day (BID) | ORAL | Status: DC
  Administered 2015-06-09 – 2015-06-14 (×11): 20 mg via ORAL
  Filled 2015-06-09 (×14): qty 1

## 2015-06-09 MED ORDER — OXYCODONE HCL 5 MG PO TABS
5.0000 mg | ORAL_TABLET | ORAL | Status: DC | PRN
  Administered 2015-06-09 – 2015-06-13 (×5): 5 mg via ORAL
  Filled 2015-06-09 (×5): qty 1

## 2015-06-09 MED ORDER — ARIPIPRAZOLE 2 MG PO TABS
1.0000 mg | ORAL_TABLET | Freq: Every day | ORAL | Status: DC
  Administered 2015-06-09 – 2015-06-13 (×5): 1 mg via ORAL
  Filled 2015-06-09 (×8): qty 1

## 2015-06-09 MED ORDER — VANCOMYCIN HCL IN DEXTROSE 1-5 GM/200ML-% IV SOLN
1000.0000 mg | Freq: Two times a day (BID) | INTRAVENOUS | Status: DC
  Administered 2015-06-09 – 2015-06-10 (×3): 1000 mg via INTRAVENOUS
  Filled 2015-06-09 (×5): qty 200

## 2015-06-09 MED ORDER — SODIUM CHLORIDE 0.9 % IV SOLN
INTRAVENOUS | Status: DC
  Administered 2015-06-09: 03:00:00 via INTRAVENOUS

## 2015-06-09 MED ORDER — CEFEPIME HCL 1 G IJ SOLR
1.0000 g | Freq: Three times a day (TID) | INTRAMUSCULAR | Status: DC
  Administered 2015-06-09 – 2015-06-14 (×16): 1 g via INTRAVENOUS
  Filled 2015-06-09 (×18): qty 1

## 2015-06-09 MED ORDER — TIZANIDINE HCL 4 MG PO TABS
4.0000 mg | ORAL_TABLET | Freq: Three times a day (TID) | ORAL | Status: DC
  Administered 2015-06-09 – 2015-06-14 (×16): 4 mg via ORAL
  Filled 2015-06-09 (×19): qty 1

## 2015-06-09 MED ORDER — BACLOFEN 20 MG PO TABS
20.0000 mg | ORAL_TABLET | Freq: Four times a day (QID) | ORAL | Status: DC
  Administered 2015-06-09 – 2015-06-14 (×22): 20 mg via ORAL
  Filled 2015-06-09 (×25): qty 1

## 2015-06-09 MED ORDER — CHLORHEXIDINE GLUCONATE CLOTH 2 % EX PADS
6.0000 | MEDICATED_PAD | Freq: Every day | CUTANEOUS | Status: AC
  Administered 2015-06-09 – 2015-06-13 (×5): 6 via TOPICAL

## 2015-06-09 MED ORDER — MUPIROCIN 2 % EX OINT
1.0000 "application " | TOPICAL_OINTMENT | Freq: Two times a day (BID) | CUTANEOUS | Status: AC
  Administered 2015-06-09 – 2015-06-13 (×10): 1 via NASAL
  Filled 2015-06-09: qty 22

## 2015-06-09 MED ORDER — FLAVOXATE HCL 100 MG PO TABS
100.0000 mg | ORAL_TABLET | Freq: Three times a day (TID) | ORAL | Status: DC | PRN
  Administered 2015-06-09 – 2015-06-10 (×3): 100 mg via ORAL
  Filled 2015-06-09 (×5): qty 1

## 2015-06-09 NOTE — Progress Notes (Signed)
CTSP for concern of suprapubic catheter migration. Pt reporte pressure between her legs and on nurse exam, the catheter balloon was between her labia  Pt reports her catheter has migrated this way in the past but no one believed her. Feel her bladder spasms push it through her labia  Exam Abd: suprapubic cath site c/d/i Pelvic: catheter tip and portion of the baloon extending through the urethral meatus  The catheter balloon was deflated of approximately 30 cc saline the catheter was pulled back approximately 5-6 cm and the balloon was refilled again with 30 ccs of saline. The balloon was not inflated more for concern of balloon rupture. Urine output was noted from the catheter after repositioning  A/P 39 y/o with CP admitted for UTI, with suprapubic cath migration, now resolved with repositioning  Will continue to monitor closely  Consider touching base with urology in the AM prior to [facility].  Make sure [facility] aware of this issue  [name] Darsey A. Kennon RoundsHaney MD, MS Family Medicine Resident PGY-1 Pager [phone]

## 2015-06-09 NOTE — Clinical Social Work Note (Signed)
Clinical Social Worker has completed FL-2 and [facility] once medically stable.   CSW remains available as needed.  [name] [name], MSW, LCSWA [phone] 06/09/2015 2:22 PM

## 2015-06-09 NOTE — Progress Notes (Signed)
ANTIBIOTIC CONSULT NOTE - INITIAL  Pharmacy Consult for Vancomycin and Cefepime  Indication: rule out sepsis  Allergies  Allergen Reactions  . Morphine Dermatitis    Skin turned red    Patient Measurements: Height:  (147.3 cm) Weight: 143 lb 11.8 oz (65.2 kg) IBW/kg (Calculated) : 40.9  Vital Signs: Temp: 97.7 F (36.5 C) (07/07 2140) Temp Source: Oral (07/07 2140) BP: 123/64 mmHg (07/08 0200) Pulse Rate: 77 (07/08 0200) Intake/Output from previous day:   Intake/Output from this shift:    Labs:  Recent Labs  06/08/15 2225  WBC 7.6  HGB 10.1*  PLT 310  CREATININE 0.54   Estimated Creatinine Clearance: 73.9 mL/min (by C-G formula based on Cr of 0.54). No results for input(s): VANCOTROUGH, VANCOPEAK, VANCORANDOM, GENTTROUGH, GENTPEAK, GENTRANDOM, TOBRATROUGH, TOBRAPEAK, TOBRARND, AMIKACINPEAK, AMIKACINTROU, AMIKACIN in the last 72 hours.   Microbiology: No results found for this or any previous visit (from the past 720 hour(s)).  Medical History: Past Medical History  Diagnosis Date  . Cerebral palsy   . Pulmonary embolism     Lifetime Coumadin  . Cerebral palsy     Spastic Cerebral palsy, mentally intact  . Contracture, joint, multiple sites     Electric wheelchair, uses left hand to operate chair.   [name] Kitchen Hypertension   . OCD (obsessive compulsive disorder)   . Depression   . Migraine   . [name]   . Dysarthria   . Endometriosis   . History of recurrent UTIs   . GERD (gastroesophageal reflux disease)   . Pulmonary embolism 2011, 01/2011    Will be on lifetime coumadin  . Anemia   . Abdominal pain 01/02/2012    Overview:  Overview:  Per Previous pcp note- Dr. Ta--Pt has history of endometriosis and had DUB in 2011.  She She had u/s that showed normal endometrial stripe.  She had endometrial bx that was wnl.  She has a lot of abd cramping every month.  This cramping was sometimes not relieved with hydrocodone.  She was referred  to [facility] for IUD placement to help endometriosis and abd cramping.  [name]  . Macroscopic hematuria 03/10/2013    status post replacement of suprapubic tube 03/04/2013   . HYPERTENSION, BENIGN 01/31/2010    Qualifier: Diagnosis of  By: [name], MD, [name] History of endometriosis 10/2012    OBGYN [facility]: Laparoscopic Endometrial Ablation, TVH,  . Gram positive sepsis 10/24/2014  . HCAP (healthcare-associated pneumonia) 10/25/2014  . Abdominal pain 01/02/2012    Overview:  Overview:  Per Previous pcp note- Dr. Ta--Pt has history of endometriosis and had DUB in 2011.  She She had u/s that showed normal endometrial stripe.  She had endometrial bx that was wnl.  She has a lot of abd cramping every month.  This cramping was sometimes not relieved with hydrocodone.  She was referred to [facility] for IUD placement to help endometriosis and abd cramping.  [name] was placed 04/30/11 by [facility].  Pt still complains of abd cramping, which I am treating with hydrocodone and ultram. Pt reports some initial relief of pain from IUD, but states it has now returned back to similar to previous cramping. Gave patient a shot of Toradol 60 mg IM today-since positive exacerbation of abdominal cramping during past 2 weeks. Physical exam reassuring. Patient may need to return [facility] for further workup since she is plugged in with the GYN providers there for further treatment options  regarding endometriosis and abdominal cramping. The dysfunctional uterine bleeding now well contr  . Acute respiratory failure with hypoxia   . Cellulitis 12/22/2014  . Disease of female genital organs 10/24/2010    Overview:  Overview:  Pt has history of endometriosis and had DUB in 2011.  She had u/s that showed normal endometrial stripe.  She had endometrial bx that was wnl.  She has a lot of abd cramping every month.  This cramping was sometimes not relieved with hydrocodone.  She was referred to [facility] for IUD placement  to help endometriosis and abd cramping.  [name] was placed 04/30/11 by [facility].  Pt still complains of abd cramping, which was treated with hydrocodone and ultram.   Last seen at [facility] by OB/GYN 12/24/11, given Depo Lupron injection.  Ordering CT scan to r/o additional cause of abdominal pain (pt with known endometriosis and will not tolerate vaginal U/S, pain not improved s/p [name] or with Lupron injection-- only caused hot flashes and amenorrhea, no pain relief).  Also made referral to pain clinic for better pain control.  Do not think hysterectomy would help with pain, and concern for contamination of baclofen pump if did proceed with surgery.    8/5/13[name] OB/GYN: Pt was seen by   . Dyspnea and respiratory abnormality 02/04/2011    Overview:  Overview:  Pt has history of recurrent PE and is on chronic coumadin.  Pt has been complaining of dyspnea that she describes as chest tightness.  She has been to the ER multiple times for this.  Usually she gets CTA and serial CE.  She has had at least 6 CTA in a period of since 2011.  She also has an O2 requirement. It is unclear why pt feels dyspneic, complains of chest tightness, or has O2 requirement.  I thought that her complaint may be from deconditioning and referred her to PT.  Unfortunately Medicaid only pays for 4 PT visits.  Pt should be doing these exercises at home, but she has not.  I have referred her to [facility], and she has been seen by [name].  He did not understand the etiology of her dyspnea and O2 requirement.  He will continue to follow her.   Last Assessment & Plan:  Pt presents with concerns for chest tightness and dyspnea.  She was seen in ED yesterday and discharged home.  While there she had a CT chest 04/20/11 showed 1.  Multifocal degradation as detailed above.  N  . Gross hematuria 03/12/2013    Overview:  Overview:  status post replacement of suprapubic tube 03/04/2013  Last Assessment & Plan:  Recent urine culture collected in ED on 5/4  did not result in significant growth. Moreover, patient has been asymptomatic with normal WBC, no fever and no true symptoms making symptomatic UTI less likely.  Will continue to monitor for any sign of infection.  Patient's xarelto had been held during her recent hospitalization due to hematuria. This has since been re-started. Monitor bleeding.    [name] Kitchen History of anticoagulant therapy 10/21/2012  . Infantile cerebral palsy 01/29/2007    Overview:  She has spastic CP.  She is mentally intact.  She is wheelchair bound.  She is wheelchair bound for ambulation      . Intracervical pessary 05/07/2011    Overview:  Overview:  Placed by [facility] 04/30/11 to treat DUB and Endometriosis.  She is seen at GYN clinic at [facility] but was considered a poor  surgical candidate and referred her to [facility].  For DUB she had pelvic ultrasound that showed thin stripe and she had endometrial Bx by Dr Jennette Kettle in GYN clinic, which was negative.     . Parapneumonic effusion 10/25/2014  . Presence of intrathecal baclofen pump 03/07/2014    R LQ. Insertion T10.    [name] Kitchen Psychiatric illness 10/31/2014  . Recurrent pulmonary embolism 05/03/2011    Pt has history of recurrent PE and is on chronic coumadin.  Pt has been complaining of dyspnea that she describes as chest tightness.  She has been to the ER multiple times for this.  Usually she gets CTA and serial CE.  She has had at least 6 CTA in a period of since 2011.  She also has an O2 requirement. It is unclear why pt feels dyspneic, complains of chest tightness, or has O2 requirement.  I thought that her complaint may be from deconditioning and referred her to PT.  Unfortunately Medicaid only pays for 4 PT visits.  Pt should be doing these exercises at home, but she has not.  I have referred her to [facility], and she has been seen by [name].  He did not understand the etiology of her dyspnea and O2 requirement.  He will continue to follow her.     . Seborrheic keratosis 01/18/2011  .  Spasticity 01/05/2014  . Transient alteration of awareness, recurrent 09/08/2006    Recurrent episodes where Melenda  loses contact with her world and that have been studied thoroughly and appear nonepileptic in nature per Dr Terrace Arabia (neurology) The patient has had episodes starting in 2007 that initially began 1-4 times a day during which time she would have periods of unawareness followed by confusion. This continuous video EEG monitoring performed from October 8-12, 2007. Had     Medications:  Prescriptions prior to admission  Medication Sig Dispense Refill Last Dose  . acetaminophen (TYLENOL) 325 MG tablet Take 650 mg by mouth every 8 (eight) hours as needed for mild pain.    unknown at unknown  . ARIPiprazole (ABILIFY PO) Take 1 tablet by mouth at bedtime. 1mg  tablet per [facility]   unknown at Unknown time  . bacitracin-polymyxin b (POLYSPORIN) ointment Apply 1 g topically 2 (two) times daily. Apply around Suprapubic ostomy site   unknown at unknown  . baclofen (LIORESAL) 10 MG tablet Take 20 mg by mouth 4 (four) times daily.    unknown at unknown  . flavoxATE (URISPAS) 100 MG tablet Take 100 mg by mouth 3 (three) times daily as needed for bladder spasms.   unknown at unknown  . FLUoxetine (PROZAC) 10 MG capsule Take 10 mg by mouth daily.   unknown at unknown  . HYDROcodone-acetaminophen (NORCO/VICODIN) 5-325 MG per tablet Take 1 tablet by mouth every 6 (six) hours as needed (pain in both knees). 60 tablet 0 unknown at unknown  . ibuprofen (ADVIL,MOTRIN) 200 MG tablet Take 400 mg by mouth every 8 (eight) hours as needed for mild pain.    unknown at unknown  . Multiple Vitamin (MULTIVITAMIN) tablet Take 1 tablet by mouth daily.   unknown at unknown  . rivaroxaban (XARELTO) 20 MG TABS tablet Take 20 mg by mouth daily with supper.   unknown at unknown  . senna-docusate (SENOKOT-S) 8.6-50 MG per tablet Take 1 tablet by mouth 2 (two) times daily.   unknown at unknown  . SUMAtriptan (IMITREX) 100 MG tablet  Take 100 mg by mouth every 2 (two) hours as needed  for migraine or headache. May repeat in 2 hours if headache persists or recurs.   unknown at unknown  . tiZANidine (ZANAFLEX) 4 MG tablet Take 4 mg by mouth 3 (three) times daily. Admin at 0430, 1730 and 1930 hrs   unknown at unknown  . clonazePAM (KLONOPIN) 1 MG tablet Take 1 tablet (1 mg total) by mouth 4 (four) times daily. (Patient not taking: Reported on 06/08/2015) 120 tablet 5 Not Taking at Unknown time   Assessment: 39 y.o. female with urosepsis for empiric antibiotics  Vancomycin 1 g IV given in ED at midnight  Goal of Therapy:  Vancomycin trough level 15-20 mcg/ml  Plan:  Vancomycin 1 g IV q12h Cefepime 1 g IV q8h  Spencer Peterkin, Gary FleetGregory Vernon 06/09/2015,2:50 AM

## 2015-06-09 NOTE — Evaluation (Signed)
Physical Therapy Evaluation Patient Details Name: [name] [name] MRN: [id number] DOB: 1976/09/13 Today's Date: 06/09/2015   History of Present Illness  Pt is a 39 y/o female with a PMH significant for spastic CP, PE, and contractures of multiple joints. Pt has a suprapubic catheter and Baclofen pump. She was admitted for catheter pain.  Clinical Impression  Pt admitted with above diagnosis. Pt currently with functional limitations due to the deficits listed below (see PT Problem List). At the time of PT eval pt states that she typically uses a lift (with staff assist) to get to w/c. Pt reports that she no longer gets physical therapy services at SNF and for the past month has not been able to stretch or perform ROM and is very uncomfortable. Feel this is an important aspect of her care that will need addressing. Will keep on acute PT caseload, and recommend follow-up with physical therapy when pt returns to SNF. Pt will benefit from skilled PT to increase their independence and safety with mobility to allow discharge to the venue listed below.       Follow Up Recommendations SNF;Supervision/Assistance - 24 hour    Equipment Recommendations  None recommended by PT    Recommendations for Other Services       Precautions / Restrictions Precautions Precautions: Fall Precaution Comments: spastic CP R>L Restrictions Weight Bearing Restrictions: No      Mobility  Bed Mobility                  Transfers                    Ambulation/Gait                Stairs            Wheelchair Mobility    Modified Rankin (Stroke Patients Only)       Balance                                             Pertinent Vitals/Pain Pain Assessment: No/denies pain    Home Living Family/patient expects to be discharged to:: Skilled nursing facility                      Prior Function Level of Independence: Needs assistance   Gait /  Transfers Assistance Needed: Pt reports she is not ambulatory at baseline and staff uses a lift to get to wheelchair  ADL's / Homemaking Assistance Needed: Total assist        Hand Dominance   Dominant Hand: Left    Extremity/Trunk Assessment   Upper Extremity Assessment: RUE deficits/detail RUE Deficits / Details: Increased tone bilaterally however R>L. Increased flexion in hand, wrist, elbow; also noted shoulder elevation at rest.         Lower Extremity Assessment: RLE deficits/detail RLE Deficits / Details: Bilateral inversion noted in ankles. Increased tone in RLE noted.     Cervical / Trunk Assessment: Other exceptions  Communication   Communication: Expressive difficulties (Difficult to understand at times)  Cognition Arousal/Alertness: Awake/alert Behavior During Therapy: WFL for tasks assessed/performed Overall Cognitive Status: Within Functional Limits for tasks assessed                      General Comments General comments (skin integrity, edema, etc.): Focus of session was stretching  and PROM of LE's and UE's.     Exercises Other Exercises Other Exercises: HS stretch 3x30" bilaterally, SKTC stretch 2x30" bilaterally, hip abduction stretch 2x30" bilaterally Other Exercises: Ankle stretch into eversion 2x20" bilaterally, ankle stretch into DF 2x20"      Assessment/Plan    PT Assessment Patient needs continued PT services  PT Diagnosis Acute pain   PT Problem List Decreased strength;Decreased range of motion;Decreased activity tolerance;Pain  PT Treatment Interventions DME instruction;Functional mobility training;Therapeutic activities;Therapeutic exercise;Neuromuscular re-education;Patient/family education   PT Goals (Current goals can be found in the Care Plan section) Acute Rehab PT Goals Patient Stated Goal: Get more therapy PT Goal Formulation: With patient Time For Goal Achievement: 06/16/15 Potential to Achieve Goals: Good Additional  Goals Additional Goal #1: Family education for HEP, proper passive range activty and stretching for spastic CP    Frequency Min 1X/week   Barriers to discharge        Co-evaluation               End of Session   Activity Tolerance: Patient tolerated treatment well Patient left: in bed;with call bell/phone within reach Nurse Communication: Mobility status         Time: 1100-1120 PT Time Calculation (min) (ACUTE ONLY): 20 min   Charges:   PT Evaluation $Initial PT Evaluation Tier I: 1 Procedure     PT G CodesConni Slipper:        Trust Crago 06/09/2015, 1:17 PM  Conni SlipperLaura Rishika Mccollom, PT, DPT Acute Rehabilitation Services Pager: [phone]

## 2015-06-09 NOTE — Progress Notes (Signed)
Nutrition Brief Note  Patient identified on the Malnutrition Screening Tool (MST) Report  Wt Readings from Last 15 Encounters:  06/09/15 143 lb 11.8 oz (65.2 kg)  05/05/15 134 lb (60.782 kg)  10/22/14 135 lb 14.4 oz (61.644 kg)  08/22/14 132 lb (59.875 kg)  08/16/14 132 lb (59.875 kg)  05/03/14 123 lb 11.2 oz (56.11 kg)  03/10/14 123 lb 7.3 oz (56 kg)  03/07/14 115 lb 1.3 oz (52.2 kg)  07/15/13 115 lb (52.164 kg)  06/25/13 106 lb (48.081 kg)  06/07/13 106 lb (48.081 kg)  03/10/13 121 lb (54.885 kg)  09/23/11 117 lb (53.071 kg)  06/17/11 125 lb 6.4 oz (56.881 kg)  05/15/11 128 lb (58.06 kg)    Body mass index is 30.05 kg/(m^2). Patient meets criteria for Obesity based on current BMI. Pt reports having a good appetite and eating well PTA. Denies any weight loss.   Current diet order is Regular, patient is consuming approximately 80% of meals at this time. Labs and medications reviewed.   No nutrition interventions warranted at this time. If nutrition issues arise, please consult RD.   Ian Malkineanne Barnett RD, LDN Inpatient Clinical Dietitian Pager: [phone] After Hours Pager: [phone]

## 2015-06-09 NOTE — Progress Notes (Signed)
RN called into the room and found that the tips of pts suprapubic catheter is protruding from pt urethral opening.  MD called. RN will continue monitor.

## 2015-06-09 NOTE — ED Provider Notes (Signed)
CSN: [id number]     Arrival date & time 06/08/15  2130 History   First MD Initiated Contact with Patient 06/08/15 2136     Chief Complaint  Patient presents with  . Abdominal Pain     (Consider location/radiation/quality/duration/timing/severity/associated sxs/prior Treatment) HPI Comments: Had suprapubic catheter change yesterday.  Patient is a 39 y.o. female presenting with abdominal pain. The history is provided by the patient.  Abdominal Pain Pain location:  Suprapubic Pain quality: aching   Pain radiates to:  Does not radiate Pain severity:  Moderate Onset quality:  Gradual Duration:  2 days Timing:  Constant Progression:  Unchanged Chronicity:  New Relieved by:  Nothing Worsened by:  Nothing tried Associated symptoms: no cough, no fever, no shortness of breath and no vomiting     Past Medical History  Diagnosis Date  . Cerebral palsy   . Pulmonary embolism     Lifetime Coumadin  . Cerebral palsy     Spastic Cerebral palsy, mentally intact  . Contracture, joint, multiple sites     Electric wheelchair, uses left hand to operate chair.   [name] Kitchen Hypertension   . OCD (obsessive compulsive disorder)   . Depression   . Migraine   . [name]   . Dysarthria   . Endometriosis   . History of recurrent UTIs   . GERD (gastroesophageal reflux disease)   . Pulmonary embolism 2011, 01/2011    Will be on lifetime coumadin  . Anemia   . Abdominal pain 01/02/2012    Overview:  Overview:  Per Previous pcp note- Dr. Ta--Pt has history of endometriosis and had DUB in 2011.  She She had u/s that showed normal endometrial stripe.  She had endometrial bx that was wnl.  She has a lot of abd cramping every month.  This cramping was sometimes not relieved with hydrocodone.  She was referred to [facility] for IUD placement to help endometriosis and abd cramping.  [name]  . Macroscopic hematuria 03/10/2013    status post replacement of suprapubic tube 03/04/2013   .  HYPERTENSION, BENIGN 01/31/2010    Qualifier: Diagnosis of  By: [name], MD, [name] History of endometriosis 10/2012    OBGYN [facility]: Laparoscopic Endometrial Ablation, [facility],  . Gram positive sepsis 10/24/2014  . HCAP (healthcare-associated pneumonia) 10/25/2014  . Abdominal pain 01/02/2012    Overview:  Overview:  Per Previous pcp note- Dr. Ta--Pt has history of endometriosis and had DUB in 2011.  She She had u/s that showed normal endometrial stripe.  She had endometrial bx that was wnl.  She has a lot of abd cramping every month.  This cramping was sometimes not relieved with hydrocodone.  She was referred to [facility] for IUD placement to help endometriosis and abd cramping.  [name] was placed 04/30/11 by [facility].  Pt still complains of abd cramping, which I am treating with hydrocodone and ultram. Pt reports some initial relief of pain from IUD, but states it has now returned back to similar to previous cramping. Gave patient a shot of Toradol 60 mg IM today-since positive exacerbation of abdominal cramping during past 2 weeks. Physical exam reassuring. Patient may need to return [facility] workup since she is plugged in with the GYN providers there for further treatment options regarding endometriosis and abdominal cramping. The dysfunctional uterine bleeding now well contr  . Acute respiratory failure with hypoxia   . Cellulitis 12/22/2014  . Disease of female  genital organs 10/24/2010    Overview:  Overview:  Pt has history of endometriosis and had DUB in 2011.  She had u/s that showed normal endometrial stripe.  She had endometrial bx that was wnl.  She has a lot of abd cramping every month.  This cramping was sometimes not relieved with hydrocodone.  She was referred to [facility] for IUD placement to help endometriosis and abd cramping.  [name] was placed 04/30/11 by [facility].  Pt still complains of abd cramping, which was treated with hydrocodone and ultram.   Last seen  at [facility] by OB/GYN 12/24/11, given Depo Lupron injection.  Ordering CT scan to r/o additional cause of abdominal pain (pt with known endometriosis and will not tolerate vaginal U/S, pain not improved s/p [name] or with Lupron injection-- only caused hot flashes and amenorrhea, no pain relief).  Also made referral to pain clinic for better pain control.  Do not think hysterectomy would help with pain, and concern for contamination of baclofen pump if did proceed with surgery.    8/5/13[name] [facility]: Pt was seen by   . Dyspnea and respiratory abnormality 02/04/2011    Overview:  Overview:  Pt has history of recurrent PE and is on chronic coumadin.  Pt has been complaining of dyspnea that she describes as chest tightness.  She has been to the ER multiple times for this.  Usually she gets CTA and serial CE.  She has had at least 6 CTA in a period of since 2011.  She also has an O2 requirement. It is unclear why pt feels dyspneic, complains of chest tightness, or has O2 requirement.  I thought that her complaint may be from deconditioning and referred her to PT.  Unfortunately Medicaid only pays for 4 PT visits.  Pt should be doing these exercises at home, but she has not.  I have referred her to [facility], and she has been seen by [name].  He did not understand the etiology of her dyspnea and O2 requirement.  He will continue to follow her.   Last Assessment & Plan:  Pt presents with concerns for chest tightness and dyspnea.  She was seen in ED yesterday and discharged home.  While there she had a CT chest 04/20/11 showed 1.  Multifocal degradation as detailed above.  N  . Gross hematuria 03/12/2013    Overview:  Overview:  status post replacement of suprapubic tube 03/04/2013  Last Assessment & Plan:  Recent urine culture collected in ED on 5/4 did not result in significant growth. Moreover, patient has been asymptomatic with normal WBC, no fever and no true symptoms making symptomatic UTI less likely.  Will  continue to monitor for any sign of infection.  Patient's xarelto had been held during her recent hospitalization due to hematuria. This has since been re-started. Monitor bleeding.    [name] Kitchen History of anticoagulant therapy 10/21/2012  . Infantile cerebral palsy 01/29/2007    Overview:  She has spastic CP.  She is mentally intact.  She is wheelchair bound.  She is wheelchair bound for ambulation      . Intracervical pessary 05/07/2011    Overview:  Overview:  Placed by [facility] 04/30/11 to treat DUB and Endometriosis.  She is seen at GYN clinic at [facility] but was considered a poor surgical candidate and referred her to [facility].  For DUB she had pelvic ultrasound that showed thin stripe and she had endometrial Bx by Dr Jennette Kettle in GYN clinic,  which was negative.     . Parapneumonic effusion 10/25/2014  . Presence of intrathecal baclofen pump 03/07/2014    R LQ. Insertion T10.    [name] Kitchen. Psychiatric illness 10/31/2014  . Recurrent pulmonary embolism 05/03/2011    Pt has history of recurrent PE and is on chronic coumadin.  Pt has been complaining of dyspnea that she describes as chest tightness.  She has been to the ER multiple times for this.  Usually she gets CTA and serial CE.  She has had at least 6 CTA in a period of since 2011.  She also has an O2 requirement. It is unclear why pt feels dyspneic, complains of chest tightness, or has O2 requirement.  I thought that her complaint may be from deconditioning and referred her to PT.  Unfortunately Medicaid only pays for 4 PT visits.  Pt should be doing these exercises at home, but she has not.  [facility] and she has been seen by Dr Marchelle Gearingamaswamy.  He did not understand the etiology of her dyspnea and O2 requirement.  He will continue to follow her.     . Seborrheic keratosis 01/18/2011  . Spasticity 01/05/2014  . Transient alteration of awareness, recurrent 09/08/2006    Recurrent episodes where [name]  loses contact with her world and that have been  studied thoroughly and appear nonepileptic in nature per Dr Terrace ArabiaYan (neurology) The patient has had episodes starting in 2007 that initially began 1-4 times a day during which time she would have periods of unawareness followed by confusion. This continuous video EEG monitoring performed from October 8-12, 2007. Had    Past Surgical History  Procedure Laterality Date  . Colposcopy  06/2000  . Cesarean section      x 2  . Tubal ligation  2003  . Carpal tunnel release  08/2008    Dr Teressa SenterSypher  . Wrist surgery  06/2010    Dr Dierdre [facility]lesLi[facility]  . Baclofen pump refill      x 3 times  . Appendectomy    . Intrauterine device insertion  04/30/11    [facility]  . Laparoscopically assisted vag hysterectomy  10/27/2012  . Multiple extractions with alveoloplasty Bilateral 07/19/2013    Procedure: EXTRACTIONS #4, 1,61,09,605,11,16,19;  Surgeon: Francene Findershristopher L McCarr, DDS;  [facility];  Service: Oral Surgery;  Laterality: Bilateral;  . Pain pump implantation N/A 03/07/2014    Procedure: baclofen pump revision/replacement and Catheter connection replacement;  Surgeon: Cristi LoronJeffrey D Jenkins, MD;  Location: [facility] ORS;  Service: Neurosurgery;  Laterality: N/A;  baclofen pump revision/replacement and Catheter connection replacement  . Programable baclofen pump revision  03/17/14    Battery Replacement    Family History  Problem Relation Age of Onset  . Asthma Father   . Colon cancer Maternal Grandmother     Died in her 5260's  . Cancer Paternal Grandmother   . Breast cancer Paternal Grandmother     Died in her 6270's  . Heart attack Maternal Grandfather     Died in his 8370's  . Alzheimer's disease Paternal Grandfather     Died in his 2180's   History  Substance Use Topics  . Smoking status: Never Smoker   . Smokeless tobacco: Never Used  . Alcohol Use: 0.6 oz/week    1 Glasses of wine per week     Comment: Drinks alcohol once a month.   OB History    No  data available      Review of Systems  Constitutional: Negative for fever.  Respiratory: Negative for cough and shortness of breath.   Gastrointestinal: Positive for abdominal pain. Negative for vomiting.  All other systems reviewed and are negative.     Allergies  Morphine  Home Medications   Prior to Admission medications   Medication Sig Start Date End Date Taking? Authorizing Provider  acetaminophen (TYLENOL) 325 MG tablet Take 650 mg by mouth every 8 (eight) hours as needed for mild pain.    Yes Historical Provider, MD  ARIPiprazole (ABILIFY PO) Take 1 tablet by mouth at bedtime. 1mg  tablet per [place]   Yes Historical Provider, MD  bacitracin-polymyxin b (POLYSPORIN) ointment Apply 1 g topically 2 (two) times daily. Apply around Suprapubic ostomy site   Yes Historical Provider, MD  baclofen (LIORESAL) 10 MG tablet Take 20 mg by mouth 4 (four) times daily.    Yes Historical Provider, MD  flavoxATE (URISPAS) 100 MG tablet Take 100 mg by mouth 3 (three) times daily as needed for bladder spasms.   Yes Historical Provider, MD  FLUoxetine (PROZAC) 10 MG capsule Take 10 mg by mouth daily.   Yes Historical Provider, MD  HYDROcodone-acetaminophen (NORCO/VICODIN) 5-325 MG per tablet Take 1 tablet by mouth every 6 (six) hours as needed (pain in both knees). 05/15/15  Yes Leighton Roach McDiarmid, MD  ibuprofen (ADVIL,MOTRIN) 200 MG tablet Take 400 mg by mouth every 8 (eight) hours as needed for mild pain.    Yes Historical Provider, MD  Multiple Vitamin (MULTIVITAMIN) tablet Take 1 tablet by mouth daily.   Yes Historical Provider, MD  rivaroxaban (XARELTO) 20 MG TABS tablet Take 20 mg by mouth daily with supper.   Yes Historical Provider, MD  senna-docusate (SENOKOT-S) 8.6-50 MG per tablet Take 1 tablet by mouth 2 (two) times daily. 01/02/15  Yes Stephanie Coup [address], MD  SUMAtriptan (IMITREX) 100 MG tablet Take 100 mg by mouth every 2 (two) hours as needed for migraine or headache. May repeat in 2 hours if headache  persists or recurs.   Yes Historical Provider, MD  tiZANidine (ZANAFLEX) 4 MG tablet Take 4 mg by mouth 3 (three) times daily. Admin at 0430, 1730 and 1930 hrs   Yes Historical Provider, MD  clonazePAM (KLONOPIN) 1 MG tablet Take 1 tablet (1 mg total) by mouth 4 (four) times daily. Patient not taking: Reported on 06/08/2015 05/25/15   Leighton Roach McDiarmid, MD   BP 131/70 mmHg  Pulse 87  Temp(Src) 97.7 F (36.5 C) (Oral)  Resp 18  SpO2 100%  LMP 04/05/2011 Physical Exam  Constitutional: She is oriented to person, place, and time. She appears well-developed and well-nourished. No distress.  HENT:  Head: Normocephalic and atraumatic.  Mouth/Throat: Oropharynx is clear and moist.  Eyes: EOM are normal. Pupils are equal, round, and reactive to light.  Neck: Normal range of motion. Neck supple.  Cardiovascular: Normal rate and regular rhythm.  Exam reveals no friction rub.   No murmur heard. Pulmonary/Chest: Effort normal and breath sounds normal. No respiratory distress. She has no wheezes. She has no rales.  Abdominal: Soft. She exhibits no distension. There is tenderness (diffuse, worse around suprapubic site). There is no rebound.  Musculoskeletal: Normal range of motion. She exhibits no edema.  Neurological: She is alert and oriented to person, place, and time.  Skin: No rash noted. She is not diaphoretic.  Nursing note and vitals reviewed.   ED Course  Procedures (including critical care  time) Labs Review Labs Reviewed  URINALYSIS, ROUTINE W REFLEX MICROSCOPIC (NOT AT [facility]) - Abnormal; Notable for the following:    Color, Urine RED (*)    APPearance TURBID (*)    Hgb urine dipstick LARGE (*)    Bilirubin Urine SMALL (*)    Ketones, ur 15 (*)    Protein, ur >300 (*)    Nitrite POSITIVE (*)    Leukocytes, UA LARGE (*)    All other components within normal limits  CBC - Abnormal; Notable for the following:    RBC 3.86 (*)    Hemoglobin 10.1 (*)    HCT 32.2 (*)    All other  components within normal limits  BASIC METABOLIC PANEL - Abnormal; Notable for the following:    Glucose, Bld 107 (*)    Calcium 8.5 (*)    All other components within normal limits  URINE MICROSCOPIC-ADD ON - Abnormal; Notable for the following:    Bacteria, UA MANY (*)    Crystals CA OXALATE CRYSTALS (*)    All other components within normal limits  URINE CULTURE  CULTURE, BLOOD (ROUTINE X 2)  CULTURE, BLOOD (ROUTINE X 2)  I-STAT CG4 LACTIC ACID, ED    Imaging Review No results found.   EKG Interpretation None       CRITICAL CARE Performed by: Dagmar Hait   Total critical care time: 45 minutes  Critical care time was exclusive of separately billable procedures and treating other patients.  Critical care was necessary to treat or prevent imminent or life-threatening deterioration.  Critical care was time spent personally by me on the following activities: development of treatment plan with patient and/or surrogate as well as nursing, discussions with consultants, evaluation of patient's response to treatment, examination of patient, obtaining history from patient or surrogate, ordering and performing treatments and interventions, ordering and review of laboratory studies, ordering and review of radiographic studies, pulse oximetry and re-evaluation of patient's condition.  Angiocath insertion Performed by: Dagmar Hait  Consent: Verbal consent obtained. Risks and benefits: risks, benefits and alternatives were discussed Time out: Immediately prior to procedure a "time out" was called to verify the correct patient, procedure, equipment, support staff and site/side marked as required.  Preparation: Patient was prepped and draped in the usual sterile fashion.  Vein Location: R AC  YES Ultrasound Guided  Gauge: 18  Normal blood return and flush without difficulty Patient tolerance: Patient tolerated the procedure well with no immediate  complications.    MDM   Final diagnoses:  Acute cystitis with hematuria  Severe sepsis    39 year old female here with abdominal pain. Recent suprapubic catheter change for pain. She has blood and pus coming out around her catheter site on her abdomen with diffuse tenderness. Initial blood pressure 83/51. We'll give fluids and check labs. Persistently low blood pressures despite fluids. Ultrasound IV placement by me and after 3 L, patient still hypotensive. Levophed started. Blood cultures obtained, vancomycin Zosyn started. Urine returned shows infection. Critical care consulted and will admit.    Elwin Mocha, MD 06/09/15 0110

## 2015-06-09 NOTE — H&P (Signed)
PULMONARY / CRITICAL CARE MEDICINE   Name: [name] [name] MRN: [id number] DOB: 11-Mar-1976    ADMISSION DATE:  06/08/2015 CONSULTATION DATE:  06/09/15  REFERRING MD :  ED  CHIEF COMPLAINT:  Urinary Catheter Pain  INITIAL PRESENTATION: 39yo female presenting from [facility] with complaint of suprapubic urinary catheter pain. Has some pain at baseline, but pain became acutely worse 7/6. Catheter last changed on 7/6 at 2300 and urine at that time was noted to be dark and bloody. In ED, BP was noted to be low without improvement after 3L NS boluses. Started on Levophed for pressure support.  STUDIES:  No imaging studies  SIGNIFICANT EVENTS: 7/6: Suprapubic Catheter Change 7/7: Presented to ED with suprapubic catheter pain. Noted to have Low BP. Initiated on Levophed.  HISTORY OF PRESENT ILLNESS:   39yo female with history of cerebral palsy, chronic UTI with suprapubic catheter in place, and recurrent PE presenting from [facility] with complaint of suprapubic urinary catheter pain. Has some pain at baseline. States suprapubic pain started last week, but became acutely worse 7/6. Catheter last changed on 7/6 at 2300 and urine at that time was noted to be dark and bloody. States pain feels consistent with previous episodes of UTI. Denies chest pain, shortness of breath, fever, nausea/vomiting, diarrhea/constipation. In ED, BP was noted to be low without improvement after 3L NS boluses. Started on Levophed for pressure support.  Follows with [facility] for CP and episodes of transient alteration in alertness. Recommends not further workup or treatment of alterations in alertness; this is most likely secondary to anxiety. Currently prescribed Xarelto for recurrent PE. Follows with [facility] for bladder injections and changing of catheter q2-3 weeks.  PAST MEDICAL HISTORY :   has a past medical history of Cerebral palsy; Pulmonary embolism; Cerebral palsy; Contracture,  joint, multiple sites; Hypertension; OCD (obsessive compulsive disorder); Depression; Migraine; [name]; Dysarthria; Endometriosis; History of recurrent UTIs; GERD (gastroesophageal reflux disease); Pulmonary embolism (2011, 01/2011); Anemia; Abdominal pain (01/02/2012); Macroscopic hematuria (03/10/2013); HYPERTENSION, BENIGN (01/31/2010); History of endometriosis (10/2012); Gram positive sepsis (10/24/2014); HCAP (healthcare-associated pneumonia) (10/25/2014); Abdominal pain (01/02/2012); Acute respiratory failure with hypoxia; Cellulitis (12/22/2014); Disease of female genital organs (10/24/2010); Dyspnea and respiratory abnormality (02/04/2011); Gross hematuria (03/12/2013); History of anticoagulant therapy (10/21/2012); Infantile cerebral palsy (01/29/2007); Intracervical pessary (05/07/2011); Parapneumonic effusion (10/25/2014); Presence of intrathecal baclofen pump (03/07/2014); Psychiatric illness (10/31/2014); Recurrent pulmonary embolism (05/03/2011); Seborrheic keratosis (01/18/2011); Spasticity (01/05/2014); and Transient alteration of awareness, 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  has past surgical history that includes Colposcopy (06/2000); Cesarean section; Tubal ligation (2003); Carpal tunnel release (08/2008); Wrist surgery (06/2010); Baclofen pump refill; Appendectomy; Intrauterine device insertion (04/30/11); laparoscopically assisted Vag Hysterectomy (10/27/2012); Multiple extractions with alveoloplasty (Bilateral, 07/19/2013); Pain pump implantation (N/A, 03/07/2014); and Programable baclofen pump revision (03/17/14). Prior to Admission medications   Medication Sig Start Date End Date Taking? Authorizing Provider  acetaminophen (TYLENOL) 325 MG tablet Take 650 mg by mouth every 8 (eight) hours as needed for mild pain.    Yes Historical Provider, MD  ARIPiprazole (ABILIFY PO) Take 1 tablet by mouth at bedtime. 1mg  tablet per [facility]   Yes Historical Provider, MD  bacitracin-polymyxin b (POLYSPORIN) ointment  Apply 1 g topically 2 (two) times daily. Apply around Suprapubic ostomy site   Yes Historical Provider, MD  baclofen (LIORESAL) 10 MG tablet Take 20 mg by mouth 4 (four) times daily.    Yes Historical Provider, MD  flavoxATE (URISPAS) 100 MG tablet Take 100 mg by mouth  3 (three) times daily as needed for bladder spasms.   Yes Historical Provider, MD  FLUoxetine (PROZAC) 10 MG capsule Take 10 mg by mouth daily.   Yes Historical Provider, MD  HYDROcodone-acetaminophen (NORCO/VICODIN) 5-325 MG per tablet Take 1 tablet by mouth every 6 (six) hours as needed (pain in both knees). 05/15/15  Yes Leighton Roachodd D McDiarmid, MD  ibuprofen (ADVIL,MOTRIN) 200 MG tablet Take 400 mg by mouth every 8 (eight) hours as needed for mild pain.    Yes Historical Provider, MD  Multiple Vitamin (MULTIVITAMIN) tablet Take 1 tablet by mouth daily.   Yes Historical Provider, MD  rivaroxaban (XARELTO) 20 MG TABS tablet Take 20 mg by mouth daily with supper.   Yes Historical Provider, MD  senna-docusate (SENOKOT-S) 8.6-50 MG per tablet Take 1 tablet by mouth 2 (two) times daily. 01/02/15  Yes Stephanie Couphristopher M Street, MD  SUMAtriptan (IMITREX) 100 MG tablet Take 100 mg by mouth every 2 (two) hours as needed for migraine or headache. May repeat in 2 hours if headache persists or recurs.   Yes Historical Provider, MD  tiZANidine (ZANAFLEX) 4 MG tablet Take 4 mg by mouth 3 (three) times daily. Admin at 0430, 1730 and 1930 hrs   Yes Historical Provider, MD  clonazePAM (KLONOPIN) 1 MG tablet Take 1 tablet (1 mg total) by mouth 4 (four) times daily. Patient not taking: Reported on 06/08/2015 05/25/15   Leighton Roachodd D McDiarmid, MD   Allergies  Allergen Reactions  . Morphine Dermatitis    Skin turned red    FAMILY HISTORY:  indicated that her mother is alive. She indicated that her father is alive. She indicated that her brother is alive. She indicated that her maternal grandmother is deceased. She indicated that her maternal grandfather is deceased. She  indicated that her paternal grandmother is deceased. She indicated that her paternal grandfather is deceased. She indicated that her daughter is alive. She indicated that her son is alive.  SOCIAL HISTORY:  reports that she has never smoked. She has never used smokeless tobacco. She reports that she drinks about 0.6 oz of alcohol per week. She reports that she does not use illicit drugs.  REVIEW OF SYSTEMS:  Denies chest pain, shortness of breath, fever, nausea/vomiting, diarrhea/constipation  SUBJECTIVE:   VITAL SIGNS: Temp:  [97.7 F (36.5 C)] 97.7 F (36.5 C) (07/07 2140) Pulse Rate:  [57-93] 57 (07/07 2345) Resp:  [18] 18 (07/07 2140) BP: (68-114)/(39-62) 101/52 mmHg (07/08 0005) SpO2:  [90 %-99 %] 99 % (07/07 2345)   INTAKE / OUTPUT: No intake or output data in the 24 hours ending 06/09/15 0017  PHYSICAL EXAMINATION: General:  39yo female resting comfortably in no apparent distress Neuro:  Alert awake and oriented x3, no focal deficits HEENT:  MMM, PERRLA Cardiovascular: S1 and S2 noted. No murmurs/rubs/gallops. Regular rate and rhythm. Lungs:  CTAB. No wheezes. No increased work of breathing. On . Abdomen:  Suprapubic tenderness. Soft and nondistended. Bowel sounds noted. Negative Murphy's. Musculoskeletal:  Contractures noted. No edema. Skin:  1st degree pressure ulcer over sacrum. No rashes. Very mild erythema around suprapubic catheter insertion site but does not appear actively infected.  LABS:  CBC  Recent Labs Lab 06/08/15 2225  WBC 7.6  HGB 10.1*  HCT 32.2*  PLT 310   Coag's No results for input(s): APTT, INR in the last 168 hours. BMET  Recent Labs Lab 06/08/15 2225  NA 136  K 4.1  CL 101  CO2 28  BUN 13  CREATININE 0.54  GLUCOSE 107*   Electrolytes  Recent Labs Lab 06/08/15 2225  CALCIUM 8.5*   Sepsis Markers  Recent Labs Lab 06/08/15 2252  LATICACIDVEN 0.92   ABG No results for input(s): PHART, PCO2ART, PO2ART in the last 168  hours. Liver Enzymes No results for input(s): AST, ALT, ALKPHOS, BILITOT, ALBUMIN in the last 168 hours. Cardiac Enzymes No results for input(s): TROPONINI, PROBNP in the last 168 hours. Glucose No results for input(s): GLUCAP in the last 168 hours.  Imaging No results found.   ASSESSMENT / PLAN:  PULMONARY A: No Acute  Issues History of recurrent PE   P:   Continue to monitor respiratory status Continue home Xarelto  CARDIOVASCULAR CVL: None. Peripheral line in place. A:  Possible Septic Shock Hypotension. Baseline BP 90-120/60-70  P:  Levophed for Pressure Support Continue to monitor BP Consider Arterial Line placement if worsens  RENAL A:   No acute issues.   P:   BMP within normal limits. Continue to monitor kidney function with possible septic shock.  GASTROINTESTINAL A:   No acute issues  P:   Regular Diet  HEMATOLOGIC A:   Anemia (Hemoglobin 10.1, baseline 9-10)  P:  Continue to monitor CBC  INFECTIOUS A:   Acute UTI with H/O Chronic UTI with Neurogenic Bladder Sepsis. qSOFA 2 at presentation  P:   Lactic Acid normal (0.92) Urinalysis with many bacteria, large leukocytes, positive nitrites, >300 protein Change catheter q2-3 weeks with last change 7/6. Follows at [facility] for botox injections to bladder.  Blood Culture x2 pending (7/7) Urine Culture pending (7/7)  Zosyn 7/7>>7/7 Vancomycin 7/7 >> Cefepime 7/8>>  ENDOCRINE A:   No acute issues.   P:   Continue to monitor glucose on BMP  NEUROLOGIC A:   History of Spastic Cerebral Palsy H/O Transient Alteration in Awareness  P:   -Has Baclofen pump. Last refilled in March. Follows with Dr. Sharene Skeans. Next refill due in August -Neurology recommends no further workup or management of transient alteration of awareness. Suspect secondary to anxiety -Home medications include Baclofen, Zanaflex  FAMILY  - Updates: Contact family in am - Inter-disciplinary family meet or Palliative  Care meeting due by: 06/16/15  day 7 of hospitalization  Garry Heater, DO Family Medicine, PGY-2 Pager: [phone]  06/09/2015, 12:17 AM   PCCM ATTENDING: I have reviewed pt's initial presentation, consultants notes and hospital database in detail.  The above assessment and plan was formulated under my direction.  In summary: Cerebral palsy with chronic suprapubic catheter Severe sepsis/septic shock due to UTI MRSA nasal swab positive Hypotension is now resolved and she is off vasopressors Her urine cx results are pending There is no evidence of organ system dysfunction She may be transferred to [facility] to assume her care in AM and PCCM off. Discussed with Dr Joseph Art Narrow abx as able depending on urine cx results   Billy Fischer, MD;  PCCM service; Mobile [phone]

## 2015-06-09 NOTE — Clinical Social Work Note (Signed)
Clinical Social Work Assessment  Patient Details  Name: [name] [name] MRN: [id number] Date of Birth: 04/26/1976  Date of referral:  06/09/15               Reason for consult:  Discharge Planning                Permission sought to share information with:  Case Manager, Facility Contact Representative, PCP, Family Supports Permission granted to share information::  Yes, Verbal Permission Granted  Name::      ([name] Baldwin )  Agency::   ([facility] )  Relationship::   (Father )  Contact Information:   ([phone])  Housing/Transportation Living arrangements for the past 2 months:  Skilled Nursing Facility Source of Information:  Patient Patient Interpreter Needed:  None Criminal Activity/Legal Involvement Pertinent to Current Situation/Hospitalization:  No - Comment as needed Significant Relationships:  Friend, Parents Lives with:  Facility Resident Do you feel safe going back to the place where you live?  Yes Need for family participation in patient care:  No (Coment)  Care giving concerns:  No care giving concerns reported by patient at this time.    Social Worker assessment / plan:  Clinical Social Worker met with patient in reference to post-acute placement/ pt's return to SNF. Patient has a PMH of cerebral palsy and was admitted for a chronic UTI with urinary catheter pain. Patient confirmed she is from [facility] and wishes to return once medically stable. Patient further reported she has been a resident of [facility] for almost 3 years and is pleased with the care provided. Patient further indicated she has strong family support and gave CSW permission to contact family as needed. No further concerns reported by family at this time. CSW will continue to follow pt and pt's family for continued support and to facilitate pt's discharge needs once medically stable.  Employment status:  Disabled (Comment on whether or not  currently receiving Disability) Insurance information:  Medicaid In State PT Recommendations:  Not assessed at this time Information / Referral to community resources:  Skilled Nursing Facility  Patient/Family's Response to care:  Pt lying in bed alert and oriented x4. Pt agreeable to returning to [facility] once medically stable. Pt pleasant and appreciated social work intervention.   Patient/Family's Understanding of and Emotional Response to Diagnosis, Current Treatment, and Prognosis: Patient understanding of current diagnosis and current on going treatment. CSW remains available as needed.    Emotional Assessment Appearance:  Well-Groomed Attitude/Demeanor/Rapport:   (Pleasant ) Affect (typically observed):  Accepting, Calm, Pleasant Orientation:  Oriented to Self, Oriented to Place, Oriented to  Time, Oriented to Situation Alcohol / Substance use:  Never Used Psych involvement (Current and /or in the community):  No (Comment)  Discharge Needs  Concerns to be addressed:  No discharge needs identified Readmission within the last 30 days:  No Current discharge risk:  None Barriers to Discharge:  No Barriers Identified    , MSW, LCSWA [phone] 06/09/2015 12:26 PM  

## 2015-06-10 ENCOUNTER — Encounter (HOSPITAL_COMMUNITY): Payer: Self-pay | Admitting: *Deleted

## 2015-06-10 DIAGNOSIS — I272 Pulmonary hypertension, unspecified: Secondary | ICD-10-CM | POA: Diagnosis present

## 2015-06-10 DIAGNOSIS — N3000 Acute cystitis without hematuria: Secondary | ICD-10-CM | POA: Diagnosis present

## 2015-06-10 DIAGNOSIS — A419 Sepsis, unspecified organism: Secondary | ICD-10-CM | POA: Diagnosis present

## 2015-06-10 DIAGNOSIS — R6521 Severe sepsis with septic shock: Secondary | ICD-10-CM

## 2015-06-10 DIAGNOSIS — D638 Anemia in other chronic diseases classified elsewhere: Secondary | ICD-10-CM | POA: Diagnosis present

## 2015-06-10 DIAGNOSIS — N3001 Acute cystitis with hematuria: Secondary | ICD-10-CM | POA: Diagnosis present

## 2015-06-10 LAB — URINE CULTURE

## 2015-06-10 LAB — CBC
HCT: 29.3 % — ABNORMAL LOW (ref 36.0–46.0)
HEMOGLOBIN: 9.2 g/dL — AB (ref 12.0–15.0)
MCH: 26.3 pg (ref 26.0–34.0)
MCHC: 31.4 g/dL (ref 30.0–36.0)
MCV: 83.7 fL (ref 78.0–100.0)
PLATELETS: 304 10*3/uL (ref 150–400)
RBC: 3.5 MIL/uL — ABNORMAL LOW (ref 3.87–5.11)
RDW: 14.5 % (ref 11.5–15.5)
WBC: 7.1 10*3/uL (ref 4.0–10.5)

## 2015-06-10 LAB — COMPREHENSIVE METABOLIC PANEL
ALT: 11 U/L — ABNORMAL LOW (ref 14–54)
AST: 16 U/L (ref 15–41)
Albumin: 2.7 g/dL — ABNORMAL LOW (ref 3.5–5.0)
Alkaline Phosphatase: 58 U/L (ref 38–126)
Anion gap: 8 (ref 5–15)
BUN: <5 mg/dL — ABNORMAL LOW (ref 6–20)
CALCIUM: 8.2 mg/dL — AB (ref 8.9–10.3)
CO2: 26 mmol/L (ref 22–32)
CREATININE: 0.53 mg/dL (ref 0.44–1.00)
Chloride: 105 mmol/L (ref 101–111)
GFR calc Af Amer: >60 mL/min (ref >60)
GFR calc non Af Amer: >60 mL/min (ref >60)
Glucose, Bld: 105 mg/dL — ABNORMAL HIGH (ref 65–99)
Potassium: 3.7 mmol/L (ref 3.5–5.1)
SODIUM: 139 mmol/L (ref 135–145)
Total Bilirubin: 0.6 mg/dL (ref 0.3–1.2)
Total Protein: 5.2 g/dL — ABNORMAL LOW (ref 6.5–8.1)

## 2015-06-10 LAB — VANCOMYCIN, TROUGH: VANCOMYCIN TR: 12 ug/mL (ref 10.0–20.0)

## 2015-06-10 MED ORDER — SODIUM CHLORIDE 0.9 % IV SOLN
INTRAVENOUS | Status: DC
  Administered 2015-06-10 – 2015-06-11 (×2): via INTRAVENOUS

## 2015-06-10 MED ORDER — VANCOMYCIN HCL 10 G IV SOLR
1250.0000 mg | Freq: Two times a day (BID) | INTRAVENOUS | Status: DC
  Administered 2015-06-10: 1250 mg via INTRAVENOUS
  Filled 2015-06-10 (×3): qty 1250

## 2015-06-10 NOTE — Progress Notes (Signed)
ANTIBIOTIC CONSULT NOTE - FOLLOW UP  Pharmacy Consult:  Vancomycin / Cefepime Indication:  Urosepsis  Allergies  Allergen Reactions  . Morphine Dermatitis    Skin turned red    Patient Measurements: Height: 4\' 10"  (147.3 cm) Weight: 143 lb 11.8 oz (65.2 kg) IBW/kg (Calculated) : 40.9  Vital Signs: Temp: 98.1 F (36.7 C) (07/09 1144) Temp Source: Oral (07/09 1144) BP: 93/60 mmHg (07/09 1200) Pulse Rate: 68 (07/09 1200) Intake/Output from previous day: 07/08 0701 - 07/09 0700 In: 921.6 [P.O.:200; I.V.:171.6; IV Piggyback:550] Out: 2025 [Urine:2025]  Labs:  Recent Labs  06/08/15 2225 06/10/15 0216  WBC 7.6 7.1  HGB 10.1* 9.2*  PLT 310 304  CREATININE 0.54 0.53   Estimated Creatinine Clearance: 73.9 mL/min (by C-G formula based on Cr of 0.53).  Recent Labs  06/10/15 1905  VANCOTROUGH 12     Microbiology: Recent Results (from the past 720 hour(s))  Urine culture     Status: None   Collection Time: 06/08/15 10:48 PM  Result Value Ref Range Status   Specimen Description URINE, CATHETERIZED  Final   Special Requests NONE  Final   Culture   Final    MULTIPLE SPECIES PRESENT, SUGGEST RECOLLECTION IF CLINICALLY INDICATED   Report Status 06/10/2015 FINAL  Final  Blood culture (routine x 2)     Status: None (Preliminary result)   Collection Time: 06/09/15 12:00 AM  Result Value Ref Range Status   Specimen Description RIGHT ANTECUBITAL  Final   Special Requests BOTTLES DRAWN AEROBIC AND ANAEROBIC 5CC  Final   Culture NO GROWTH 1 DAY  Final   Report Status PENDING  Incomplete  Blood culture (routine x 2)     Status: None (Preliminary result)   Collection Time: 06/09/15 12:08 AM  Result Value Ref Range Status   Specimen Description BLOOD RIGHT HAND  Final   Special Requests BOTTLES DRAWN AEROBIC AND ANAEROBIC 5CC  Final   Culture NO GROWTH 1 DAY  Final   Report Status PENDING  Incomplete  MRSA PCR Screening     Status: Abnormal   Collection Time: 06/09/15  2:37  AM  Result Value Ref Range Status   MRSA by PCR POSITIVE (A) NEGATIVE Final    Comment:        The GeneXpert MRSA Assay (FDA approved for NASAL specimens only), is one component of a comprehensive MRSA colonization surveillance program. It is not intended to diagnose MRSA infection nor to guide or monitor treatment for MRSA infections. RESULT CALLED TO, READ BACK BY AND VERIFIED WITH: RN Hardie PulleyGREG HARDUCK [phone] @0548  THANEY       Assessment: [age] YOF with history of cerebral palsy and neurogenic bladder to continue on vancomycin and cefepime for urosepsis.  Patient's renal function has been stable and her vancomycin trough is sub-therapeutic.  Being cautious with interpretation of creatine clearance.  Vanc 7/7 >> Cefepime 7/8 >>  7/9 VT = 12 mcg/mL on 1g q12 >> 1250mg  q12  7/8 MRSA PCR - positive 7/7 UCx - negative 7/7 BCx x2 - NGTD   Goal of Therapy:  Vanc trough ~15 mcg/mL   Plan:  - Increase vanc to 1250mg  IV Q12H - Continue cefepime 1gm IV Q8H - Monitor renal fxn, abx LOT to determine whether to repeat VT to r/o accumulation    Mickelle Goupil D. Laney Potashang, PharmD, BCPS Pager:  [phone] 06/10/2015, 8:26 PM

## 2015-06-10 NOTE — Progress Notes (Signed)
PULMONARY / CRITICAL CARE MEDICINE   Name: [name] SlickerJudith Alberto MRN: [id number] DOB: February 07, 1976    ADMISSION DATE: 06/08/2015 CONSULTATION DATE: 06/09/15  REFERRING MD : ED  CHIEF COMPLAINT: Urinary Catheter Pain  INITIAL PRESENTATION: 39yo female presenting from [facility] with complaint of suprapubic urinary catheter pain. Has some pain at baseline, but pain became acutely worse 7/6. Catheter last changed on 7/6 at 2300 and urine at that time was noted to be dark and bloody. In ED, BP was noted to be low without improvement after 3L NS boluses. Started on Levophed for pressure support.  STUDIES:  No imaging studies  SIGNIFICANT EVENTS: 7/6: Suprapubic Catheter Change 7/7: Presented to ED with suprapubic catheter pain. Noted to have Low BP. Initiated on Levophed.   SUBJECTIVE:  Overnight suprapubic catheter migration through the urethra x2 requiring adjustment  VITAL SIGNS: Temp:  [97.4 F (36.3 C)-98.6 F (37 C)] 98.1 F (36.7 C) (07/09 0007) Pulse Rate:  [54-113] 102 (07/08 2100) Resp:  [11-37] 20 (07/08 2100) BP: (87-140)/(47-103) 134/72 mmHg (07/08 2100) SpO2:  [85 %-100 %] 99 % (07/08 2100) FiO2 (%):  [21 %] 21 % (07/08 0250) Weight:  [143 lb 11.8 oz (65.2 kg)] 143 lb 11.8 oz (65.2 kg) (07/08 0245) HEMODYNAMICS:   VENTILATOR SETTINGS: Vent Mode:  [-]  FiO2 (%):  [21 %] 21 % INTAKE / OUTPUT:  Intake/Output Summary (Last 24 hours) at 06/10/15 0049 Last data filed at 06/09/15 2200  Gross per 24 hour  Intake 1378.38 ml  Output   2550 ml  Net -1171.62 ml    PHYSICAL EXAMINATION: General:  NAD Neuro:  AO x3 HEENT:  MM, PERRLA Cardiovascular:  RRR Lungs:  CTAB Abdomen:  Soft, non tender, suprapubic catheter in place and dressing soaked with urine Musculoskeletal:  Bilateral UE and LE contractures Skin:  No rashes External genitalia: wnl, catheter not visualized  LABS:  CBC  Recent Labs Lab 06/08/15 2225  WBC 7.6  HGB 10.1*  HCT 32.2*  PLT  310   Coag's No results for input(s): APTT, INR in the last 168 hours. BMET  Recent Labs Lab 06/08/15 2225  NA 136  K 4.1  CL 101  CO2 28  BUN 13  CREATININE 0.54  GLUCOSE 107*   Electrolytes  Recent Labs Lab 06/08/15 2225  CALCIUM 8.5*   Sepsis Markers  Recent Labs Lab 06/08/15 2252  LATICACIDVEN 0.92   ABG No results for input(s): PHART, PCO2ART, PO2ART in the last 168 hours. Liver Enzymes No results for input(s): AST, ALT, ALKPHOS, BILITOT, ALBUMIN in the last 168 hours. Cardiac Enzymes No results for input(s): TROPONINI, PROBNP in the last 168 hours. Glucose  Recent Labs Lab 06/09/15 0357  GLUCAP 141*    Imaging No results found.   ASSESSMENT / PLAN:  PULMONARY A: No Acute Issues History of recurrent PE   P:  Continue to monitor respiratory status Continue home Xarelto  CARDIOVASCULAR CVL: None. Peripheral line in place. A:  Possible Septic Shock- resolved Hypotension. Baseline BP 90-120/60-70-n Resolved  P:  S/p Levophed for Pressure Support Continue to monitor BP   RENAL A:  Suprapubic catheter migration  P: Consider  touching base with Urology for cath recs prior to d/c to [facility] Trend BMP  GASTROINTESTINAL A:  No acute issues  P:  Regular Diet  HEMATOLOGIC A:  Anemia (Hemoglobin 10.1, baseline 9-10)  P:  Trend CBC  INFECTIOUS A:  Acute UTI with H/O Chronic UTI with Neurogenic Bladder Sepsis. qSOFA 2 at  presentation  P:  Lactic Acid normal (0.92) Urinalysis with many bacteria, large leukocytes, positive nitrites, >300 protein Change catheter q2-3 weeks with last change 7/6. Follows at [facility] for botox injections to bladder.  Blood Culture x2 pending (7/7) Urine Culture pending (7/7)  Zosyn 7/7>>7/7 Vancomycin 7/7 >> Cefepime 7/8>>  ENDOCRINE A:  No acute issues.  P:  Continue to monitor glucose on BMP  NEUROLOGIC A:  History of Spastic Cerebral Palsy H/O  Transient Alteration in Awareness  P:  -Has Baclofen pump. Last refilled in March. Follows with Dr. Sharene Skeans. Next refill due in August -Neurology recommends no further workup or management of transient alteration of awareness. Suspect secondary to anxiety -Home medications include Baclofen, Zanaflex, seroquel  FAMILY  - Updates: Contact family in am - Inter-disciplinary family meet or Palliative Care meeting due by: 06/16/15 day 7 of hospitalization  Dispo: Anticipate d/c to [facility] this AM  Alyssa A. Kennon Rounds MD, MS Family Medicine Resident PGY-1 Pager [phone]   06/10/2015, 12:49 AM  Billy Fischer, MD ; [facility] [phone].  After 5:30 PM or weekends, call [phone]

## 2015-06-10 NOTE — Progress Notes (Signed)
Dr Joseph ArtWoods called and informed of suprapubic cath  leaking around stoma and no urine in tubing or foley bag. Catheter tip is seen ,on physical exam, protruding from the urethra . Will continue to monitor patient.

## 2015-06-10 NOTE — Progress Notes (Signed)
[place] TEAM 1 - Stepdown/ICU TEAM Progress Note  Cashmere Dingley WUJ:[id number] DOB: [birth year] DOA: 06/08/2015 PCP: Devota Pace, MD  Admit HPI / Brief Narrative: 89AO ZH PMHx OCD, depression, migraine, Cerebral Palsy, dysarthria, Chronic UTI with suprapubic catheter in place, recurrent PE, Intrathecal Baclofen Pump, Pain pump implantation.   Presenting from [facility] with complaint of suprapubic urinary catheter pain. Has some pain at baseline. States suprapubic pain started last week, but became acutely worse 7/6. Catheter last changed on 7/6 at 2300 and urine at that time was noted to be dark and bloody. States pain feels consistent with previous episodes of UTI. Denies chest pain, shortness of breath, fever, nausea/vomiting, diarrhea/constipation. In ED, BP was noted to be low without improvement after 3L NS boluses. Started on Levophed for pressure support.  Follows with [facility] for CP and episodes of transient alteration in alertness. Recommends not further workup or treatment of alterations in alertness; this is most likely secondary to anxiety. Currently prescribed Xarelto for recurrent PE. Follows with [facility] for bladder injections and changing of catheter q2-3 weeks.  HPI/Subjective: 7/9 A/O 4, patient states last night the suprapubic cath was repositioned and was extremely painful. States she tried to inform medical staff that the pain was out of proportion to normal replacement of her catheter but no one believed her.  Assessment/Plan: Recurrent PE  -Continue home Xarelto  Septic Shock? -Hypotension. Baseline BP 90-120/60-70 -Off Vasopressers -Normal saline @ 62ml/hr  Acute UTI with H/O Chronic UTI/Neurogenic Bladder -Sepsis. qSOFA 2 at presentation -Would complete full 5 day course of antibiotics given her history. Consider prophylactic antibiotics when discharged -Change catheter q2-3 weeks with last change 7/6. Follows at [facility] for botox  injections to bladder. -Patient's suprapubic catheter malfunctioning; was repositioned overnight and currently urine is pouring around the suprapubic catheter and tip of the catheter is visible through urethra. -Phone consult Dr. Mauri Brooklyn (urology), who requested that we again try to reposition catheter. After reposition catheter good urine flow observed into bag.   Pulmonary hypertension  Anemia of chronic disease (Hemoglobin 10.1, baseline 9-10) -Patient currently at the low end of her normal range. Will watch closely  Spastic Cerebral Palsy -Has Baclofen pump. Last refilled in March. Follows with Dr. Sharene Skeans. Next refill due in August -Home medications include Baclofen, Zanaflex  Transient Alteration in Awareness -Neurology recommends no further workup or management of transient alteration of awareness. Suspect secondary to anxiety -Currently patient alert and oriented      Code Status: FULL Family Communication: no family present at time of exam Disposition Plan: Return to [facility]    Consultants: Phone consult Dr. Mauri Brooklyn (urology)   Procedure/Significant Events: 10/24/14 echocardiogram;- LVEF=  65%. - Pulmonary arteries: PA peak pressure: 39 mm Hg (S).   Culture Blood Culture x2 pending (7/7) Urine Culture pending (7/7) 7/8 MRSA by PCR positive   Antibiotics:  Zosyn 7/7>>7/7 Vancomycin 7/7 >> Cefepime 7/8>>   DVT prophylaxis: Xarelto   Devices Chronic suprapubic cath   LINES / TUBES:      Continuous Infusions: . norepinephrine (LEVOPHED) Adult infusion Stopped (06/09/15 1010)    Objective: VITAL SIGNS: Temp: 98.1 F (36.7 C) (07/09 1144) Temp Source: Oral (07/09 1144) BP: 93/60 mmHg (07/09 1200) Pulse Rate: 68 (07/09 1200) SPO2; FIO2:   Intake/Output Summary (Last 24 hours) at 06/10/15 2051 Last data filed at 06/10/15 1100  Gross per 24 hour  Intake    980 ml  Output   1075 ml  Net    -95 ml     Exam: General: A/O  4, positive dysarthria but understandable, No acute respiratory distress Eyes: Negative headache, eye pain, double vision,negative scleral hemorrhage ENT: Negative Runny nose, negative ear pain, negative tinnitus, negative gingival bleeding Neck:  Negative scars, masses, positive torticollis, negative lymphadenopathy, JVD Lungs: Clear to auscultation bilaterally without wheezes or crackles Cardiovascular: Regular rate and rhythm without murmur gallop or rub normal S1 and S2 Abdomen:negative abdominal pain, negative dysphagia, Nontender, nondistended, soft, bowel sounds positive, no rebound, no ascites, no appreciable mass, suprapubic catheter in place with significant amount of urine leaking around catheter prior to repositioning. Extremities: No significant cyanosis, clubbing, or edema bilateral lower extremities Psychiatric:  Negative depression, negative anxiety, negative fatigue, negative mania  Neurologic:  Cranial nerves II through XII intact, tongue/uvula midline, all extremities contracted and stiff. Bilateral lower extremities patient can't flex and extend with help., Positive dysarthria, negative expressive aphasia, negative receptive aphasia.     Data Reviewed: Basic Metabolic Panel:  Recent Labs Lab 06/08/15 2225 06/10/15 0216  NA 136 139  K 4.1 3.7  CL 101 105  CO2 28 26  GLUCOSE 107* 105*  BUN 13 <5*  CREATININE 0.54 0.53  CALCIUM 8.5* 8.2*   Liver Function Tests:  Recent Labs Lab 06/10/15 0216  AST 16  ALT 11*  ALKPHOS 58  BILITOT 0.6  PROT 5.2*  ALBUMIN 2.7*   No results for input(s): LIPASE, AMYLASE in the last 168 hours. No results for input(s): AMMONIA in the last 168 hours. CBC:  Recent Labs Lab 06/08/15 2225 06/10/15 0216  WBC 7.6 7.1  HGB 10.1* 9.2*  HCT 32.2* 29.3*  MCV 83.4 83.7  PLT 310 304   Cardiac Enzymes: No results for input(s): CKTOTAL, CKMB, CKMBINDEX, TROPONINI in the last 168 hours. BNP (last 3 results) No results for  input(s): BNP in the last 8760 hours.  ProBNP (last 3 results)  Recent Labs  10/22/14 1402  PROBNP 321.7*    CBG:  Recent Labs Lab 06/09/15 0357  GLUCAP 141*    Recent Results (from the past 240 hour(s))  Urine culture     Status: None   Collection Time: 06/08/15 10:48 PM  Result Value Ref Range Status   Specimen Description URINE, CATHETERIZED  Final   Special Requests NONE  Final   Culture   Final    MULTIPLE SPECIES PRESENT, SUGGEST RECOLLECTION IF CLINICALLY INDICATED   Report Status 06/10/2015 FINAL  Final  Blood culture (routine x 2)     Status: None (Preliminary result)   Collection Time: 06/09/15 12:00 AM  Result Value Ref Range Status   Specimen Description RIGHT ANTECUBITAL  Final   Special Requests BOTTLES DRAWN AEROBIC AND ANAEROBIC 5CC  Final   Culture NO GROWTH 1 DAY  Final   Report Status PENDING  Incomplete  Blood culture (routine x 2)     Status: None (Preliminary result)   Collection Time: 06/09/15 12:08 AM  Result Value Ref Range Status   Specimen Description BLOOD RIGHT HAND  Final   Special Requests BOTTLES DRAWN AEROBIC AND ANAEROBIC 5CC  Final   Culture NO GROWTH 1 DAY  Final   Report Status PENDING  Incomplete  MRSA PCR Screening     Status: Abnormal   Collection Time: 06/09/15  2:37 AM  Result Value Ref Range Status   MRSA by PCR POSITIVE (A) NEGATIVE Final    Comment:        The GeneXpert MRSA Assay (  FDA approved for NASAL specimens only), is one component of a comprehensive MRSA colonization surveillance program. It is not intended to diagnose MRSA infection nor to guide or monitor treatment for MRSA infections. RESULT CALLED TO, READ BACK BY AND VERIFIED WITH: RN Hardie PulleyGREG HARDUCK [phone] @0548  THANEY      Studies:  Recent x-ray studies have been reviewed in detail by the Attending Physician  Scheduled Meds:  Scheduled Meds: . ARIPiprazole  1 mg Oral QHS  . baclofen  20 mg Oral 4 times per day  . ceFEPime (MAXIPIME) IV  1 g  Intravenous 3 times per day  . Chlorhexidine Gluconate Cloth  6 each Topical Q0600  . famotidine  20 mg Oral BID  . FLUoxetine  10 mg Oral Daily  . mupirocin ointment  1 application Nasal BID  . polymixin-bacitracin  1 application Topical BID  . QUEtiapine  100 mg Oral QHS  . rivaroxaban  20 mg Oral Q supper  . senna-docusate  1 tablet Oral BID  . tiZANidine  4 mg Oral 3 times per day  . vancomycin  1,250 mg Intravenous Q12H    Time spent on care of this patient: 40 mins   [place], Roselind MessierURTIS J , MD  [facility] Office  [phone] Pager [phone]- [phone]  On-Call/Text Page:      [email or web address]      password [email or web address]  If 7PM-7AM, please contact night-coverage [email or web address] Password [email or web address] 06/10/2015, 8:51 PM   LOS: 1 day   Care during the described time interval was provided by me .  I have reviewed this patient's available data, including medical history, events of note, physical examination, and all test results as part of my evaluation. I have personally reviewed and interpreted all radiology studies.   Carolyne Littlesurtis Dantavious Snowball, MD [phone] Pager

## 2015-06-10 NOTE — Progress Notes (Signed)
Called again to see pt as her catheter again migrated through her urethra  It was again pulled up and taped in place  Will continue to monitor  [name] [name] A. Kennon RoundsHaney MD, MS Family Medicine Resident PGY-1 Pager [phone]

## 2015-06-11 ENCOUNTER — Inpatient Hospital Stay (HOSPITAL_COMMUNITY): Payer: Medicaid Other

## 2015-06-11 DIAGNOSIS — T8351XD Infection and inflammatory reaction due to indwelling urinary catheter, subsequent encounter: Secondary | ICD-10-CM

## 2015-06-11 DIAGNOSIS — T83018A Breakdown (mechanical) of other indwelling urethral catheter, initial encounter: Secondary | ICD-10-CM

## 2015-06-11 LAB — CBC WITH DIFFERENTIAL/PLATELET
Basophils Absolute: 0 10*3/uL (ref 0.0–0.1)
Basophils Relative: 1 % (ref 0–1)
EOS PCT: 5 % (ref 0–5)
Eosinophils Absolute: 0.3 10*3/uL (ref 0.0–0.7)
HEMATOCRIT: 30.9 % — AB (ref 36.0–46.0)
HEMOGLOBIN: 9.8 g/dL — AB (ref 12.0–15.0)
LYMPHS ABS: 2.3 10*3/uL (ref 0.7–4.0)
Lymphocytes Relative: 35 % (ref 12–46)
MCH: 26.6 pg (ref 26.0–34.0)
MCHC: 31.7 g/dL (ref 30.0–36.0)
MCV: 84 fL (ref 78.0–100.0)
MONO ABS: 0.6 10*3/uL (ref 0.1–1.0)
Monocytes Relative: 9 % (ref 3–12)
Neutro Abs: 3.4 10*3/uL (ref 1.7–7.7)
Neutrophils Relative %: 50 % (ref 43–77)
Platelets: 296 10*3/uL (ref 150–400)
RBC: 3.68 MIL/uL — AB (ref 3.87–5.11)
RDW: 14.4 % (ref 11.5–15.5)
WBC: 6.7 10*3/uL (ref 4.0–10.5)

## 2015-06-11 LAB — COMPREHENSIVE METABOLIC PANEL
ALK PHOS: 58 U/L (ref 38–126)
ALT: 10 U/L — ABNORMAL LOW (ref 14–54)
AST: 13 U/L — ABNORMAL LOW (ref 15–41)
Albumin: 2.8 g/dL — ABNORMAL LOW (ref 3.5–5.0)
Anion gap: 8 (ref 5–15)
BILIRUBIN TOTAL: 0.4 mg/dL (ref 0.3–1.2)
BUN: <5 mg/dL — ABNORMAL LOW (ref 6–20)
CO2: 26 mmol/L (ref 22–32)
Calcium: 8.3 mg/dL — ABNORMAL LOW (ref 8.9–10.3)
Chloride: 104 mmol/L (ref 101–111)
Creatinine, Ser: 0.39 mg/dL — ABNORMAL LOW (ref 0.44–1.00)
GFR calc non Af Amer: >60 mL/min (ref >60)
Glucose, Bld: 118 mg/dL — ABNORMAL HIGH (ref 65–99)
Potassium: 3.7 mmol/L (ref 3.5–5.1)
Sodium: 138 mmol/L (ref 135–145)
Total Protein: 5.5 g/dL — ABNORMAL LOW (ref 6.5–8.1)

## 2015-06-11 LAB — MAGNESIUM: MAGNESIUM: 1.7 mg/dL (ref 1.7–2.4)

## 2015-06-11 NOTE — Progress Notes (Signed)
Family Medicine Teaching Service Daily Progress Note Intern Pager: [phone]  Patient name: [name] SlickerJudith [name] Medical record number: [id number] Date of birth: 08-Sep-1976 Age: 39 y.o. Gender: female  Primary Care Provider: Devota Pacealeb Melancon, MD Consultants: urology Code Status: will need to ask patient  Pt Overview and Major Events to Date:  7/10: Transferred to family medicine service  Assessment and Plan: [name] [name] is a 39yo female presenting from [facility] with complaint of suprapubic urinary catheter pain.  UTI: Pt has history of chronic UTI and Neurogenic bladder. Was noted to have sepsis earlier in hospital course that has not resolved. Last catheter change was 7/6. She follows up with [facility] for botox injections to bladder. Her catheter has been malfunctioning. It has been repositioned by urology but urine is still leaking out. Currently taking Cefepime (Day 3) -Attempt to put a size 24 catheter in place of the one in patient  -If 24 does not work, consult urology again -Will need to follow up with outpatient urology -Consider transitioning abx  Septic Shock -hypotension has improved. Current BP is 106/68. Continue to monitor BP -Continue to monitor other vital signs which have been normal -Continue to monitor mental status which is currently normal -Follow WBC although right now 8.1  Spastic Cerebral Palsy -Continue Baclofen pump.  -Home medications include Baclofen and zanaflex   FEN/GI: regular diet PPx: xarelto  Disposition: stable  Subjective:  [name] SlickerJudith [name] is a 39yo female presenting [facility] with complaint of suprapubic urinary catheter pain. She no longer has pain in the suprapubic region although her catheter is not functioning properly. She has urine that is leaking out.    Objective: Temp:  [98.2 F (36.8 C)-98.8 F (37.1 C)] 98.7 F (37.1 C) (07/10 1440) Pulse Rate:  [48-77] 73 (07/10 1440) Resp:  [14-18] 16 (07/10  1440) BP: (96-123)/(50-80) 96/50 mmHg (07/10 1440) SpO2:  [94 %-99 %] 95 % (07/10 1440) Weight:  [141 lb (63.957 kg)] 141 lb (63.957 kg) (07/10 [id number]) Physical Exam: General: well developed, well nourished, no acute distress. Dysarthria secondary to CP Cardiovascular: RRR, normal s1 and s2, no rubs, gallops, or murmurs Respiratory: Clear to auscultation bilaterally without wheezes or crackles Abdomen: soft, nontender, nondistended, positive  bowel sounds. suprapubic catheter in place with significant amount of urine leaking around catheter.  Extremities: no edema  Laboratory:  Recent Labs Lab 06/10/15 0216 06/11/15 0255 06/12/15 0419  WBC 7.1 6.7 8.1  HGB 9.2* 9.8* 9.8*  HCT 29.3* 30.9* 30.5*  PLT 304 296 321    Recent Labs Lab 06/10/15 0216 06/11/15 0255 06/12/15 0419  NA 139 138 137  K 3.7 3.7 3.9  CL 105 104 103  CO2 26 26 26   BUN <5* <5* 7  CREATININE 0.53 0.39* 0.40*  CALCIUM 8.2* 8.3* 8.0*  PROT 5.2* 5.5*  --   BILITOT 0.6 0.4  --   ALKPHOS 58 58  --   ALT 11* 10*  --   AST 16 13*  --   GLUCOSE 105* 118* 101*     Imaging/Diagnostic Tests: No results found.   Beaulah Dinninghristina M Trudy Kory, MD 06/11/2015, 5:52 PM PGY-1, [facility] Family Medicine FPTS Intern pager: [phone], text pages welcome

## 2015-06-11 NOTE — Progress Notes (Signed)
Patient belong to family practice service. Will transfer care today. Hartley BarefootBelkys Giavana Rooke, Md

## 2015-06-11 NOTE — Progress Notes (Signed)
Dr Nunzio CoryLyons called informed that super pubic cath continues to migrate with tip out of urethra and urine continues to leak out of stoma.

## 2015-06-11 NOTE — Progress Notes (Signed)
Progress Note  Mindi SlickerJudith Stipes JYN:[id number] DOB: 1976/04/13 DOA: 06/08/2015 PCP: Devota Pacealeb Melancon, MD  Admit HPI / Brief Narrative: 86VH QI: 39yo WF PMHx OCD, depression, migraine, Cerebral Palsy, dysarthria, Chronic UTI with suprapubic catheter in place, recurrent PE, Intrathecal Baclofen Pump, Pain pump implantation.   Presenting from [facility] with complaint of suprapubic urinary catheter pain. Has some pain at baseline. States suprapubic pain started last week, but became acutely worse 7/6. Catheter last changed on 7/6 at 2300 and urine at that time was noted to be dark and bloody. States pain feels consistent with previous episodes of UTI. Denies chest pain, shortness of breath, fever, nausea/vomiting, diarrhea/constipation. In ED, BP was noted to be low without improvement after 3L NS boluses. Started on Levophed for pressure support.  Follows with [facility] for CP and episodes of transient alteration in alertness. Recommends not further workup or treatment of alterations in alertness; this is most likely secondary to anxiety. Currently prescribed Xarelto for recurrent PE. [facility] for bladder injections and changing of catheter q2-3 weeks.  HPI/Subjective: 7/9 A/O 4, patient states last night the suprapubic cath was repositioned and was extremely painful. States she tried to inform medical staff that the pain was out of proportion to normal replacement of her catheter but no one believed her.  Assessment/Plan: Recurrent PE  -Continue home Xarelto  Septic Shock? -Hypotension. Baseline BP 90-120/60-70 -Off Vasopressers -stop fluids. Will stop vancomycin. Continue with cefepime.   Acute UTI with H/O Chronic UTI/Neurogenic Bladder -Sepsis.  -Change catheter q2-3 weeks with last change 7/6. [facility]. -Patient's suprapubic catheter malfunctioning; was repositioned overnight and currently urine is pouring around the  suprapubic catheter and tip of the catheter was  visible through urethra, multiples times overnight. -Urology consulted.  -urine culture; multiple bacterial morphotype's. Culture send again.  -Continue with cefepime day 2.   Pulmonary hypertension  Anemia of chronic disease (Hemoglobin 10.1, baseline 9-10) -Patient currently at the low end of her normal range. Will watch closely  Spastic Cerebral Palsy -Has Baclofen pump. Last refilled in March. Follows with Dr. Sharene SkeansHickling. Next refill due in August -Home medications include Baclofen, Zanaflex  Transient Alteration in Awareness -Neurology recommends no further workup or management of transient alteration of awareness. Suspect secondary to anxiety -Currently patient alert and oriented   Abdominal Pain; check KUB.     Code Status: FULL Family Communication: no family present at time of exam Disposition Plan: [facility] to [facility] hopefully on monday    Consultants:  Dr. Mauri BrooklynMatt Lyons (urology)   Procedure/Significant Events: 10/24/14 echocardiogram;- LVEF=  65%. - Pulmonary arteries: PA peak pressure: 39 mm Hg (S).   Culture Blood Culture x2 pending (7/7) Urine Culture pending (7/7) 7/8 MRSA by PCR positive   Antibiotics:  Zosyn 7/7>>7/7 Vancomycin 7/7 >> Cefepime 7/8>>   DVT prophylaxis: Xarelto   Devices Chronic suprapubic cath   LINES / TUBES:      Continuous Infusions: . sodium chloride 50 mL/hr at 06/11/15 0200  . norepinephrine (LEVOPHED) Adult infusion Stopped (06/09/15 1010)    Objective: VITAL SIGNS: Temp: 98.2 F (36.8 C) (07/10 0605) Temp Source: Oral (07/10 0605) BP: 123/69 mmHg (07/10 0605) Pulse Rate: 48 (07/10 0605) SPO2; FIO2:   Intake/Output Summary (Last 24 hours) at 06/11/15 0814 Last data filed at 06/11/15 0656  Gross per 24 hour  Intake 1101.67 ml  Output    625 ml  Net 476.67 ml     Exam: General:  A/O 4, positive dysarthria but understandable, No acute  respiratory distress Lungs: Clear to auscultation bilaterally without wheezes or crackles Cardiovascular: Regular rate and rhythm without murmur gallop or rub normal S1 and S2 Abdomen:negative abdominal pain, nondistended, soft, bowel sounds positive, no rebound, no ascites, no appreciable mass, suprapubic catheter in place with mild amount of urine leaking around catheter  Extremities: No significant cyanosis, clubbing, or edema bilateral lower extremities Neurologic: all extremities contracted and stiff. Bilateral lower extremities patient can't flex and extend with help., Positive dysarthria, negative expressive aphasia, negative receptive aphasia.     Data Reviewed: Basic Metabolic Panel:  Recent Labs Lab 06/08/15 2225 06/10/15 0216 06/11/15 0255  NA 136 139 138  K 4.1 3.7 3.7  CL 101 105 104  CO2 GLUCOSE 107* 105* 118*  BUN 13 <5* <5*  CREATININE 0.54 0.53 0.39*  CALCIUM 8.5* 8.2* 8.3*  MG  --   --  1.7   Liver Function Tests:  Recent Labs Lab 06/10/15 0216 06/11/15 0255  AST 16 13*  ALT 11* 10*  ALKPHOS 58 58  BILITOT 0.6 0.4  PROT 5.2* 5.5*  ALBUMIN 2.7* 2.8*   No results for input(s): LIPASE, AMYLASE in the last 168 hours. No results for input(s): AMMONIA in the last 168 hours. CBC:  Recent Labs Lab 06/08/15 2225 06/10/15 0216 06/11/15 0255  WBC 7.6 7.1 6.7  NEUTROABS  --   --  3.4  HGB 10.1* 9.2* 9.8*  HCT 32.2* 29.3* 30.9*  MCV 83.4 83.7 84.0  PLT 310 304 296   Cardiac Enzymes: No results for input(s): CKTOTAL, CKMB, CKMBINDEX, TROPONINI in the last 168 hours. BNP (last 3 results) No results for input(s): BNP in the last 8760 hours.  ProBNP (last 3 results)  Recent Labs  10/22/14 1402  PROBNP 321.7*    CBG:  Recent Labs Lab 06/09/15 0357  GLUCAP 141*    Recent Results (from the past 240 hour(s))  Urine culture     Status: None   Collection Time: 06/08/15 10:48 PM  Result Value Ref Range Status   Specimen Description  URINE, CATHETERIZED  Final   Special Requests NONE  Final   Culture   Final    MULTIPLE SPECIES PRESENT, SUGGEST RECOLLECTION IF CLINICALLY INDICATED   Report Status 06/10/2015 FINAL  Final  Blood culture (routine x 2)     Status: None (Preliminary result)   Collection Time: 06/09/15 12:00 AM  Result Value Ref Range Status   Specimen Description RIGHT ANTECUBITAL  Final   Special Requests BOTTLES DRAWN AEROBIC AND ANAEROBIC 5CC  Final   Culture NO GROWTH 1 DAY  Final   Report Status PENDING  Incomplete  Blood culture (routine x 2)     Status: None (Preliminary result)   Collection Time: 06/09/15 12:08 AM  Result Value Ref Range Status   Specimen Description BLOOD RIGHT HAND  Final   Special Requests BOTTLES DRAWN AEROBIC AND ANAEROBIC 5CC  Final   Culture NO GROWTH 1 DAY  Final   Report Status PENDING  Incomplete  MRSA PCR Screening     Status: Abnormal   Collection Time: 06/09/15  2:37 AM  Result Value Ref Range Status   MRSA by PCR POSITIVE (A) NEGATIVE Final    Comment:        The GeneXpert MRSA Assay (FDA approved for NASAL specimens only), is one component of a comprehensive MRSA colonization surveillance program. It is not intended to diagnose MRSA infection nor  to guide or monitor treatment for MRSA infections. RESULT CALLED TO, READ BACK BY AND VERIFIED WITH: RN Hardie Pulley [id number] @0548  THANEY      Studies:  Recent x-ray studies have been reviewed in detail by the Attending Physician  Scheduled Meds:  Scheduled Meds: . ARIPiprazole  1 mg Oral QHS  . baclofen  20 mg Oral 4 times per day  . ceFEPime (MAXIPIME) IV  1 g Intravenous 3 times per day  . Chlorhexidine Gluconate Cloth  6 each Topical Q0600  . famotidine  20 mg Oral BID  . FLUoxetine  10 mg Oral Daily  . mupirocin ointment  1 application Nasal BID  . polymixin-bacitracin  1 application Topical BID  . QUEtiapine  100 mg Oral QHS  . rivaroxaban  20 mg Oral Q supper  . senna-docusate  1 tablet Oral  BID  . tiZANidine  4 mg Oral 3 times per day  . vancomycin  1,250 mg Intravenous Q12H    Time spent on care of this patient: 30 mins   Alba Cory , MD  [facility] Office  [phone] Pager [phone]  On-Call/Text Page:      [email or web address]      password [email or web address]  If 7PM-7AM, please contact night-coverage [email or web address] Password [email or web address] 06/11/2015, 8:14 AM   LOS: 2 days

## 2015-06-11 NOTE — Consult Note (Signed)
Urology Consult Note   Requesting Attending Physician:  Alba Cory, MD Service Requesting Consult: [facility]  Assessment:  39yF with multiple medical problems including NGB 2/2 CP managed with suprapubic catheter admitted for complicated UTI and concerns for Sepsis. Now with distal migration of 18 Fr SPT to the urethral meatus and decreased drainage.  Recommendations:  -Catheter secured to abdominal wall just under left breast with stat lock to help prevent migration distally. Irrigating and draining well.  -Patulous urethral meatus likely cause of migration and unfortunately there is not much that can be done to prevent this other than securing catheter  -Alternatives would be to place sutures at the skin level or to place a URETHRAL 18 Fr foley catheter for maximal urinary drainage and decreased leakage level to hold in place and after discussion with patient, she would like to avoid both of these options at this time  -Plan to schedule Oxybutynin 5 mg PO TID for bladder spasm control  -Follow-up with [facility] for ongoing NGB and catheter care (Dr. Marcelyn Bruins)  -Continue supportive care per primary team  -Urology will sign off at this time  -Please contact us with any additional questions or concerns  Dr. Vernie Ammons was available  HPI  Reason for Consult:  Issues with suprapubic catheter  39 y.o. female is seen in consultation for suprapubic catheter issues at the request of Belkys Regalado,MD  Patient presented to [facility] ED from her nursing home with complaints of suprapubic urinary catheter pain. This started last week and became acutely worse a few days ago. Catheter was changed 7/6 and urine was dark and bloody at that time. She was noted to be hypotensive even following 3L NS boluses and was started on levophed but is now off vasopressors. Urology called because SPT was noted to be visible through her urethral meatus with associated leakage around the SPT site. She is  followed at [facility] for botox injections to the bladder and has frequent SPT exchanges.  Blood cultures from 7/7 as well as urine culture from same date are both pending. She is being covered broadly on vanc, zosyn and cefepime.  18 Fr SPT in place and nursing staff has been having difficulty with distal migration and leakage from below and decreased drainage from above.  Allergies Morphine  Medications   Current Facility-Administered Medications  Medication Dose Route Frequency Provider Last Rate Last Dose  . 0.9 %  sodium chloride infusion  250 mL Intravenous PRN Araceli Bouche, DO   Stopped at 06/09/15 1500  . acetaminophen (TYLENOL) tablet 650 mg  650 mg Oral Q8H PRN Nora N Rumley, DO   650 mg at 06/09/15 0600  . ARIPiprazole (ABILIFY) tablet 1 mg  1 mg Oral QHS [place] N Rumley, DO   1 mg at 06/10/15 2346  . baclofen (LIORESAL) tablet 20 mg  20 mg Oral 4 times per day [facility], DO   20 mg at 06/11/15 0523  . ceFEPIme (MAXIPIME) 1 g in dextrose 5 % 50 mL IVPB  1 g Intravenous 3 times per day Coralyn Helling, MD   1 g at 06/11/15 0430  . Chlorhexidine Gluconate Cloth 2 % PADS 6 each  6 each Topical Q0600 Coralyn Helling, MD   6 each at 06/11/15 0700  . famotidine (PEPCID) tablet 20 mg  20 mg Oral BID [facility], DO   20 mg at 06/10/15 2345  . flavoxATE (URISPAS) tablet 100 mg  100 mg Oral TID PRN [facility]  N Rumley, DO   100 mg at 06/10/15 0642  . FLUoxetine (PROZAC) capsule 10 mg  10 mg Oral Daily [place] N Rumley, DO   10 mg at 06/10/15 1012  . mupirocin ointment (BACTROBAN) 2 % 1 application  1 application Nasal BID Coralyn Helling, MD   1 application at 06/10/15 2359  . oxyCODONE (Oxy IR/ROXICODONE) immediate release tablet 5 mg  5 mg Oral Q4H PRN Bonney Aid, MD   5 mg at 06/10/15 0642  . polymixin-bacitracin (POLYSPORIN) ointment 1 application  1 application Topical BID Araceli Bouche, DO   1 application at 06/10/15 2359  . QUEtiapine (SEROQUEL) tablet 100 mg  100 mg Oral QHS  Bonney Aid, MD   100 mg at 06/10/15 2346  . rivaroxaban (XARELTO) tablet 20 mg  20 mg Oral Q supper [facility], DO   20 mg at 06/10/15 1700  . senna-docusate (Senokot-S) tablet 1 tablet  1 tablet Oral BID Araceli Bouche, DO   1 tablet at 06/10/15 2345  . tiZANidine (ZANAFLEX) tablet 4 mg  4 mg Oral 3 times per day [facility], DO   4 mg at 06/11/15 6213   Prescriptions prior to admission  Medication Sig Dispense Refill Last Dose  . acetaminophen (TYLENOL) 325 MG tablet Take 650 mg by mouth every 8 (eight) hours as needed for mild pain.    unknown at unknown  . ARIPiprazole (ABILIFY PO) Take 1 tablet by mouth at bedtime. 1mg  tablet per [facility]   unknown at Unknown time  . bacitracin-polymyxin b (POLYSPORIN) ointment Apply 1 g topically 2 (two) times daily. Apply around Suprapubic ostomy site   unknown at unknown  . baclofen (LIORESAL) 10 MG tablet Take 20 mg by mouth 4 (four) times daily.    unknown at unknown  . flavoxATE (URISPAS) 100 MG tablet Take 100 mg by mouth 3 (three) times daily as needed for bladder spasms.   unknown at unknown  . FLUoxetine (PROZAC) 10 MG capsule Take 10 mg by mouth daily.   unknown at unknown  . HYDROcodone-acetaminophen (NORCO/VICODIN) 5-325 MG per tablet Take 1 tablet by mouth every 6 (six) hours as needed (pain in both knees). 60 tablet 0 unknown at unknown  . ibuprofen (ADVIL,MOTRIN) 200 MG tablet Take 400 mg by mouth every 8 (eight) hours as needed for mild pain.    unknown at unknown  . Multiple Vitamin (MULTIVITAMIN) tablet Take 1 tablet by mouth daily.   unknown at unknown  . rivaroxaban (XARELTO) 20 MG TABS tablet Take 20 mg by mouth daily with supper.   unknown at unknown  . senna-docusate (SENOKOT-S) 8.6-50 MG per tablet Take 1 tablet by mouth 2 (two) times daily.   unknown at unknown  . SUMAtriptan (IMITREX) 100 MG tablet Take 100 mg by mouth every 2 (two) hours as needed for migraine or headache. May repeat in 2 hours if headache persists or  recurs.   unknown at unknown  . tiZANidine (ZANAFLEX) 4 MG tablet Take 4 mg by mouth 3 (three) times daily. Admin at 0430, 1730 and 1930 hrs   unknown at unknown  . clonazePAM (KLONOPIN) 1 MG tablet Take 1 tablet (1 mg total) by mouth 4 (four) times daily. (Patient not taking: Reported on 06/08/2015) 120 tablet 5 Not Taking at Unknown time    Past Medical History Past Medical History  Diagnosis Date  . Cerebral palsy   . Pulmonary embolism     Lifetime Coumadin  .  Cerebral palsy     Spastic Cerebral palsy, mentally intact  . Contracture, joint, multiple sites     Electric wheelchair, uses left hand to operate chair.   [name] Kitchen Hypertension   . OCD (obsessive compulsive disorder)   . Depression   . Migraine   . [name]   . Dysarthria   . Endometriosis   . History of recurrent UTIs   . GERD (gastroesophageal reflux disease)   . Pulmonary embolism 2011, 01/2011    Will be on lifetime coumadin  . Anemia   . Abdominal pain 01/02/2012    Overview:  Overview:  Per Previous pcp note- Dr. Ta--Pt has history of endometriosis and had DUB in 2011.  She She had u/s that showed normal endometrial stripe.  She had endometrial bx that was wnl.  She has a lot of abd cramping every month.  This cramping was sometimes not relieved with hydrocodone.  She was referred to [facility] for IUD placement to help endometriosis and abd cramping.  [name]  . Macroscopic hematuria 03/10/2013    status post replacement of suprapubic tube 03/04/2013   . HYPERTENSION, BENIGN 01/31/2010    Qualifier: Diagnosis of  By: [name], MD, [name] History of endometriosis 10/2012    OBGYN [facility]: Laparoscopic Endometrial Ablation, TVH,  . Gram positive sepsis 10/24/2014  . HCAP (healthcare-associated pneumonia) 10/25/2014  . Abdominal pain 01/02/2012    Overview:  Overview:  Per Previous pcp note- Dr. Ta--Pt has history of endometriosis and had DUB in 2011.  She She had u/s that showed normal endometrial  stripe.  She had endometrial bx that was wnl.  She has a lot of abd cramping every month.  This cramping was sometimes not relieved with hydrocodone.  She was referred to [facility] for IUD placement to help endometriosis and abd cramping.  [name] was placed 04/30/11 by [facility].  Pt still complains of abd cramping, which I am treating with hydrocodone and ultram. Pt reports some initial relief of pain from IUD, but states it has now returned back to similar to previous cramping. Gave patient a shot of Toradol 60 mg IM today-since positive exacerbation of abdominal cramping during past 2 weeks. Physical exam reassuring. Patient may need to return [facility] further workup since she is plugged in with the GYN providers there for further treatment options regarding endometriosis and abdominal cramping. The dysfunctional uterine bleeding now well contr  . Acute respiratory failure with hypoxia   . Cellulitis 12/22/2014  . Disease of female genital organs 10/24/2010    Overview:  Overview:  Pt has history of endometriosis and had DUB in 2011.  She had u/s that showed normal endometrial stripe.  She had endometrial bx that was wnl.  She has a lot of abd cramping every month.  This cramping was sometimes not relieved with hydrocodone.  She was referred to [facility] for IUD placement to help endometriosis and abd cramping.  [name] was placed 04/30/11 by [facility].  Pt still complains of abd cramping, which was treated with hydrocodone and ultram.   Last seen at [facility] by OB/GYN 12/24/11, given Depo Lupron injection.  Ordering CT scan to r/o additional cause of abdominal pain (pt with known endometriosis and will not tolerate vaginal U/S, pain not improved s/p [name] or with Lupron injection-- only caused hot flashes and amenorrhea, no pain relief).  Also made referral to pain clinic for better pain control.  Do not think  hysterectomy would help with pain, and concern for contamination of baclofen pump if did  proceed with surgery.    8/5/13[name]: Pt was seen by   . Dyspnea and respiratory abnormality 02/04/2011    Overview:  Overview:  Pt has history of recurrent PE and is on chronic coumadin.  Pt has been complaining of dyspnea that she describes as chest tightness.  She has been to the ER multiple times for this.  Usually she gets CTA and serial CE.  She has had at least 6 CTA in a period of since 2011.  She also has an O2 requirement. It is unclear why pt feels dyspneic, complains of chest tightness, or has O2 requirement.  I thought that her complaint may be from deconditioning and referred her to PT.  Unfortunately Medicaid only pays for 4 PT visits.  Pt should be doing these exercises at home, but she has not.  I have referred her to [facility], and she has been seen by [name].  He did not understand the etiology of her dyspnea and O2 requirement.  He will continue to follow her.   Last Assessment & Plan:  Pt presents with concerns for chest tightness and dyspnea.  She was seen in ED yesterday and discharged home.  While there she had a CT chest 04/20/11 showed 1.  Multifocal degradation as detailed above.  N  . Gross hematuria 03/12/2013    Overview:  Overview:  status post replacement of suprapubic tube 03/04/2013  Last Assessment & Plan:  Recent urine culture collected in ED on 5/4 did not result in significant growth. Moreover, patient has been asymptomatic with normal WBC, no fever and no true symptoms making symptomatic UTI less likely.  Will continue to monitor for any sign of infection.  Patient's xarelto had been held during her recent hospitalization due to hematuria. This has since been re-started. Monitor bleeding.    [name] Kitchen History of anticoagulant therapy 10/21/2012  . Infantile cerebral palsy 01/29/2007    Overview:  She has spastic CP.  She is mentally intact.  She is wheelchair bound.  She is wheelchair bound for ambulation      . Intracervical pessary 05/07/2011    Overview:  Overview:   Placed by [facility] 04/30/11 to treat DUB and Endometriosis.  She is seen at GYN clinic at [facility] but was considered a poor surgical candidate and referred her to [facility].  For DUB she had pelvic ultrasound that showed thin stripe and she had endometrial Bx by Dr Jennette Kettle in GYN clinic, which was negative.     . Parapneumonic effusion 10/25/2014  . Presence of intrathecal baclofen pump 03/07/2014    R LQ. Insertion T10.    [name] Kitchen Psychiatric illness 10/31/2014  . Recurrent pulmonary embolism 05/03/2011    Pt has history of recurrent PE and is on chronic coumadin.  Pt has been complaining of dyspnea that she describes as chest tightness.  She has been to the ER multiple times for this.  Usually she gets CTA and serial CE.  She has had at least 6 CTA in a period of since 2011.  She also has an O2 requirement. It is unclear why pt feels dyspneic, complains of chest tightness, or has O2 requirement.  I thought that her complaint may be from deconditioning and referred her to PT.  Unfortunately Medicaid only pays for 4 PT visits.  Pt should be doing these exercises at home, but she has not.  I  have referred her to [facility], and she has been seen by [name].  He did not understand the etiology of her dyspnea and O2 requirement.  He will continue to follow her.     . Seborrheic keratosis 01/18/2011  . Spasticity 01/05/2014  . Transient alteration of awareness, recurrent 09/08/2006    Recurrent episodes where Marlana  loses contact with her world and that have been studied thoroughly and appear nonepileptic in nature per Dr Terrace Arabia (neurology) The patient has had episodes starting in 2007 that initially began 1-4 times a day during which time she would have periods of unawareness followed by confusion. This continuous video EEG monitoring performed from October 8-12, 2007. Had     Past Surgical History Past Surgical History  Procedure Laterality Date  . Colposcopy  06/2000  . Cesarean section      x 2  . Tubal  ligation  2003  . Carpal tunnel release  08/2008    Dr Teressa Senter  . Wrist surgery  06/2010    Dr Dierdre Searles, hand surgeon, [name]  . Baclofen pump refill      x 3 times  . Appendectomy    . Intrauterine device insertion  04/30/11    Inserted by [facility] for endometriosis  . Laparoscopically assisted vag hysterectomy  10/27/2012  . Multiple extractions with alveoloplasty Bilateral 07/19/2013    Procedure: EXTRACTIONS #4, 9,81,19,14;  Surgeon: Francene Finders, DDS;  Location: [facility] OR;  Service: Oral Surgery;  Laterality: Bilateral;  . Pain pump implantation N/A 03/07/2014    Procedure: baclofen pump revision/replacement and Catheter connection replacement;  Surgeon: Cristi Loron, MD;  Location: [facility] ORS;  Service: Neurosurgery;  Laterality: N/A;  baclofen pump revision/replacement and Catheter connection replacement  . Programable baclofen pump revision  03/17/14    Battery Replacement     Family History family history includes Alzheimer's disease in her paternal grandfather; Asthma in her father; Breast cancer in her paternal grandmother; Cancer in her paternal grandmother; Colon cancer in her maternal grandmother; Heart attack in her maternal grandfather.  Social History: Tobacco use:   reports that she has never smoked. She has never used smokeless tobacco. Alcohol use:   reports that she drinks about 0.6 oz of alcohol per week. Drug use:  reports that she does not use illicit drugs. Patient  reports that she has never smoked. She has never used smokeless tobacco. She reports that she drinks about 0.6 oz of alcohol per week. She reports that she does not use illicit drugs.   Review of Systems 10 system review of systems negative except as noted in the HPI  Objective  Vital Signs Temp:  [98.1 F (36.7 C)-98.8 F (37.1 C)] 98.2 F (36.8 C) (07/10 0605) Pulse Rate:  [48-106] 48 (07/10 0605) Resp:  [12-21] 18 (07/10 0605) BP: (92-124)/(55-83) 123/69 mmHg (07/10 0605) SpO2:   [94 %-99 %] 94 % (07/10 0605) Weight:  [63.957 kg (141 lb)] 63.957 kg (141 lb) (07/10 0605)    Physical Exam BP 123/69 mmHg  Pulse 48  Temp(Src) 98.2 F (36.8 C) (Oral)  Resp 18  Ht 4\' 10"  (1.473 m)  Wt 63.957 kg (141 lb)  BMI 29.48 kg/m2  SpO2 94%  LMP 04/05/2011  General: well developed, well nourished, no acute distress HEENT: PERLA, EOM intact, normocephalic, atraumatic Chest: symmetrical Lungs: normal work of breathing on room air Heart: regular rate Abdomen: no tenderness, no masses or hernias, no palpable organomegaly GU: 18 Fr SPT in place  with scant leakage around the catheter, secured to the abdominal wall with a Stat Lock Rectal: deferred Extremities: no deformities, no edema, no cyanosis   Test Results  Laboratory Studies Lab Results  Component Value Date   WBC 6.7 06/11/2015   HGB 9.8* 06/11/2015   HCT 30.9* 06/11/2015   PLT 296 06/11/2015    Lab Results  Component Value Date   NA 138 06/11/2015   K 3.7 06/11/2015   CL 104 06/11/2015   CO2 26 06/11/2015   BUN <5* 06/11/2015   CREATININE 0.39* 06/11/2015   GLU 101 03/30/2015   CALCIUM 8.3* 06/11/2015   MG 1.7 06/11/2015    Lab Results  Component Value Date   BILITOT 0.4 06/11/2015   PROT 5.5* 06/11/2015   ALBUMIN 2.8* 06/11/2015   ALT 10* 06/11/2015   AST 13* 06/11/2015   ALKPHOS 58 06/11/2015    Lab Results  Component Value Date   LABPROT 13.2 03/10/2014   INR 1.02 03/10/2014   APTT 27 03/07/2014

## 2015-06-12 DIAGNOSIS — Z9359 Other cystostomy status: Secondary | ICD-10-CM

## 2015-06-12 DIAGNOSIS — N319 Neuromuscular dysfunction of bladder, unspecified: Secondary | ICD-10-CM

## 2015-06-12 DIAGNOSIS — D638 Anemia in other chronic diseases classified elsewhere: Secondary | ICD-10-CM

## 2015-06-12 DIAGNOSIS — I2699 Other pulmonary embolism without acute cor pulmonale: Secondary | ICD-10-CM

## 2015-06-12 LAB — CBC
HCT: 30.5 % — ABNORMAL LOW (ref 36.0–46.0)
Hemoglobin: 9.8 g/dL — ABNORMAL LOW (ref 12.0–15.0)
MCH: 26.6 pg (ref 26.0–34.0)
MCHC: 32.1 g/dL (ref 30.0–36.0)
MCV: 82.7 fL (ref 78.0–100.0)
Platelets: 321 10*3/uL (ref 150–400)
RBC: 3.69 MIL/uL — ABNORMAL LOW (ref 3.87–5.11)
RDW: 14.3 % (ref 11.5–15.5)
WBC: 8.1 10*3/uL (ref 4.0–10.5)

## 2015-06-12 LAB — BASIC METABOLIC PANEL
Anion gap: 8 (ref 5–15)
BUN: 7 mg/dL (ref 6–20)
CALCIUM: 8 mg/dL — AB (ref 8.9–10.3)
CHLORIDE: 103 mmol/L (ref 101–111)
CO2: 26 mmol/L (ref 22–32)
Creatinine, Ser: 0.4 mg/dL — ABNORMAL LOW (ref 0.44–1.00)
GFR calc Af Amer: >60 mL/min (ref >60)
Glucose, Bld: 101 mg/dL — ABNORMAL HIGH (ref 65–99)
Potassium: 3.9 mmol/L (ref 3.5–5.1)
Sodium: 137 mmol/L (ref 135–145)

## 2015-06-12 LAB — TSH: TSH: 0.71 u[IU]/mL (ref 0.350–4.500)

## 2015-06-12 NOTE — Progress Notes (Signed)
Patient has been transferred to [facility]; has been followed by  Derenda FennelBashira Nixon, LCSWA.  No change in d/c plan- will return [facility] when medically stable per MD.  CSW services will continue to follow.  Lorri Frederickonna T. Jaci LazierCrowder, KentuckyLCSW [phone]

## 2015-06-13 ENCOUNTER — Telehealth: Payer: Self-pay | Admitting: Family

## 2015-06-13 DIAGNOSIS — R1084 Generalized abdominal pain: Secondary | ICD-10-CM

## 2015-06-13 DIAGNOSIS — R109 Unspecified abdominal pain: Secondary | ICD-10-CM | POA: Insufficient documentation

## 2015-06-13 DIAGNOSIS — N3001 Acute cystitis with hematuria: Secondary | ICD-10-CM

## 2015-06-13 DIAGNOSIS — I270 Primary pulmonary hypertension: Secondary | ICD-10-CM

## 2015-06-13 MED ORDER — DARIFENACIN HYDROBROMIDE ER 15 MG PO TB24
15.0000 mg | ORAL_TABLET | Freq: Every day | ORAL | Status: DC
  Administered 2015-06-13 – 2015-06-14 (×2): 15 mg via ORAL
  Filled 2015-06-13 (×2): qty 1

## 2015-06-13 MED ORDER — HYDROCODONE-ACETAMINOPHEN 5-325 MG PO TABS: 1.0000 | ORAL_TABLET | Freq: Four times a day (QID) | ORAL | Status: DC | PRN

## 2015-06-13 NOTE — Telephone Encounter (Signed)
I reviewed your note and I agree with your assessment and recommendations to the patient. 

## 2015-06-13 NOTE — Progress Notes (Signed)
Patient discussed with Dr. Jonathon JordanGambino- she is not stable for d/c today [facility].  [facility] when medically [facility].  Fl2 on chart for MD's signature. Will continue to monitor and assist with d/c when medically stable per MD.  Lupita Leashonna T. Jaci LazierCrowder, Kentucky[place]CSW [phone]

## 2015-06-13 NOTE — Progress Notes (Signed)
Family Medicine Teaching Service Daily Progress Note Intern Pager: [phone]  Patient name: [name] SlickerJudith [name] Medical record number: [id number] Date of birth: January 24, 1976 Age: 39 y.o. Gender: female  Primary Care Provider: Devota Pacealeb Melancon, MD Consultants: urology Code Status: will need to ask patient  Pt Overview and Major Events to Date:  7/10: Transferred to family medicine service  Assessment and Plan: [name] [name] is a 39yo female [facility] with complaint of suprapubic urinary catheter pain.  UTI: Pt has history of chronic UTI and Neurogenic bladder. Was noted to have sepsis earlier in hospital course that has not resolved. Last catheter change was 7/6. She follows up with [facility] for botox injections to bladder. Her catheter has been malfunctioning. It has been repositioned by urology but urine is still leaking out. Currently taking Cefepime (Day 4) -Attempt to put a size 24 catheter in place of the one in patient  -If 24 does not work, consult urology again -Will need to follow up with outpatient urology -Consider transitioning abx -Monitor abdominal pain  Septic Shock -hypotension has improved. Current BP is 143/53. Continue to monitor BP -Continue to monitor other vital signs which have been normal -Continue to monitor mental status which is currently normal -Follow WBC although right now 8.1  Spastic Cerebral Palsy -Continue Baclofen pump.  -Home medications include Baclofen and zanaflex   FEN/GI: regular diet PPx: xarelto  Disposition: stable  Subjective:  [name] SlickerJudith [name] is a 39yo female [facility] with complaint of suprapubic urinary catheter pain. This morning she is complaining of abdominal pain, which had previously resolved. She states that for the past 2 days she has felt lower abdominal pain and described it as "aching". I discussed with patient possible [facility] today and she feels as though  she is not ready because of pain. Her suprapubic catheter is still leaking. Will need to discuss with Dr. McDiarmid a plan for the catheter.   Objective: Temp:  [98 F (36.7 C)-98.7 F (37.1 C)] 98.1 F (36.7 C) (07/12 0453) Pulse Rate:  [58-72] 58 (07/12 0453) Resp:  [16-18] 18 (07/12 0453) BP: (108-143)/(53-71) 143/53 mmHg (07/12 0453) SpO2:  [95 %-97 %] 95 % (07/12 0453) Weight:  [136 lb (61.689 kg)] 136 lb (61.689 kg) (07/12 0453) Physical Exam: General: well developed, well nourished, no acute distress. Dysarthria secondary to CP Cardiovascular: RRR, normal s1 and s2, no rubs, gallops, or murmurs Respiratory: Clear to auscultation bilaterally without wheezes or crackles Abdomen: soft, nondistended, positive bowel sounds. suprapubic catheter in place with significant amount of urine leaking around catheter. Positive suprapubic pain upon palpation. Inrtathecal Baclofen pump palpable on R abdomen Extremities: no edema   Laboratory:  Recent Labs Lab 06/10/15 0216 06/11/15 0255 06/12/15 0419  WBC 7.1 6.7 8.1  HGB 9.2* 9.8* 9.8*  HCT 29.3* 30.9* 30.5*  PLT 304 296 321    Recent Labs Lab 06/10/15 0216 06/11/15 0255 06/12/15 0419  NA 139 138 137  K 3.7 3.7 3.9  CL 105 104 103  CO2 26 26 26   BUN <5* <5* 7  CREATININE 0.53 0.39* 0.40*  CALCIUM 8.2* 8.3* 8.0*  PROT 5.2* 5.5*  --   BILITOT 0.6 0.4  --   ALKPHOS 58 58  --   ALT 11* 10*  --   AST 16 13*  --   GLUCOSE 105* 118* 101*     Imaging/Diagnostic Tests: No results found.   Beaulah Dinninghristina M Yaminah Clayborn, MD 06/13/2015, 7:14 AM PGY-1, [place]  [place] Intern pager: [phone], text pages welcome

## 2015-06-13 NOTE — Progress Notes (Signed)
ANTIBIOTIC CONSULT NOTE - FOLLOW UP  Pharmacy Consult:  Cefepime Indication:  Urosepsis  Allergies  Allergen Reactions  . Morphine Dermatitis    Skin turned red    Patient Measurements: Height: 4\' 10"  (147.3 cm) Weight: 136 lb (61.689 kg) (bedscale) IBW/kg (Calculated) : 40.9  Vital Signs: Temp: 98.1 F (36.7 C) (07/12 0453) Temp Source: Oral (07/12 0453) BP: 143/53 mmHg (07/12 0453) Pulse Rate: 58 (07/12 0453) Intake/Output from previous day: 07/11 0701 - 07/12 0700 In: 660 [P.O.:660] Out: 600 [Urine:600]  Labs:  Recent Labs  06/11/15 0255 06/12/15 0419  WBC 6.7 8.1  HGB 9.8* 9.8*  PLT 296 321  CREATININE 0.39* 0.40*   Estimated Creatinine Clearance: 71.9 mL/min (by C-G formula based on Cr of 0.4).  Recent Labs  06/10/15 1905  VANCOTROUGH 12     Microbiology: Recent Results (from the past 720 hour(s))  Urine culture     Status: None   Collection Time: 06/08/15 10:48 PM  Result Value Ref Range Status   Specimen Description URINE, CATHETERIZED  Final   Special Requests NONE  Final   Culture   Final    MULTIPLE SPECIES PRESENT, SUGGEST RECOLLECTION IF CLINICALLY INDICATED   Report Status 06/10/2015 FINAL  Final  Blood culture (routine x 2)     Status: None (Preliminary result)   Collection Time: 06/09/15 12:00 AM  Result Value Ref Range Status   Specimen Description RIGHT ANTECUBITAL  Final   Special Requests BOTTLES DRAWN AEROBIC AND ANAEROBIC 5CC  Final   Culture NO GROWTH 3 DAYS  Final   Report Status PENDING  Incomplete  Blood culture (routine x 2)     Status: None (Preliminary result)   Collection Time: 06/09/15 12:08 AM  Result Value Ref Range Status   Specimen Description BLOOD RIGHT HAND  Final   Special Requests BOTTLES DRAWN AEROBIC AND ANAEROBIC 5CC  Final   Culture NO GROWTH 3 DAYS  Final   Report Status PENDING  Incomplete  MRSA PCR Screening     Status: Abnormal   Collection Time: 06/09/15  2:37 AM  Result Value Ref Range Status   MRSA by PCR POSITIVE (A) NEGATIVE Final    Comment:        The GeneXpert MRSA Assay (FDA approved for NASAL specimens only), is one component of a comprehensive MRSA colonization surveillance program. It is not intended to diagnose MRSA infection nor to guide or monitor treatment for MRSA infections. RESULT CALLED TO, READ BACK BY AND VERIFIED WITH: RN Hardie PulleyGREG HARDUCK [phone] @0548  THANEY   Urine culture     Status: None (Preliminary result)   Collection Time: 06/11/15  6:04 PM  Result Value Ref Range Status   Specimen Description URINE, RANDOM  Final   Special Requests NONE  Final   Culture CULTURE REINCUBATED FOR BETTER GROWTH  Final   Report Status PENDING  Incomplete      Assessment: 39 yo female admitted 06/08/2015 with urosepsis and pain from chronic suprapubic catheter  PMH Cerebral palsy; Pulmonary embolism; Hypertension; OCD; Depression; Migraine; [name]; Endometriosis; History of recurrent UTIs; GERD; Anemia; Spasticity (01/05/2014); and Transient alteration of awareness, 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  Pharmacy consulted for Cefepime   ID urosepsis Afebrile, WBC WNL Renal function stable Day 5 of cefepime  Vanc 7/7 >> 7/10 Cefepime 7/8 >>  7/9 VT = 12 mcg/mL on 1g q12 >> 1250mg  q12  7/10 urine -  7/8 MRSA PCR - positive 7/7 UCx - negative 7/7 BCx x2 -  NGTD   Plan:  - Continue cefepime 1gm IV Q8H - Follow up 7/10 urine cultures     Hillery Aldo, Pharm.D.  06/13/2015, 10:50 AM

## 2015-06-13 NOTE — Telephone Encounter (Signed)
[name] [name] left a message saying that she was having blacking out spells. I called her back and she said that she had been admitted to hospital last week with UTI, and since being in the hospital was having blacking out spells. She said that she had short lapses of time with no memory of what happened, and that the spells had increased since she had been admitted to the hospital. I told [name] [name] that she was under stress from being sick, and being in hospital. I explained to her that the spells she described were like the ones she has had before, and were not determined to be seizures. I reassured her that these episodes were like ones the had before and not a new problem. She had no further questions. TG

## 2015-06-14 ENCOUNTER — Encounter (HOSPITAL_COMMUNITY): Payer: Self-pay | Admitting: General Practice

## 2015-06-14 ENCOUNTER — Encounter: Payer: Self-pay | Admitting: Family Medicine

## 2015-06-14 ENCOUNTER — Non-Acute Institutional Stay: Payer: Medicaid Other | Admitting: Family Medicine

## 2015-06-14 DIAGNOSIS — T83018D Breakdown (mechanical) of other indwelling urethral catheter, subsequent encounter: Secondary | ICD-10-CM

## 2015-06-14 DIAGNOSIS — N319 Neuromuscular dysfunction of bladder, unspecified: Secondary | ICD-10-CM | POA: Diagnosis not present

## 2015-06-14 DIAGNOSIS — Z593 Problems related to living in residential institution: Secondary | ICD-10-CM | POA: Diagnosis not present

## 2015-06-14 DIAGNOSIS — T83511D Infection and inflammatory reaction due to indwelling urethral catheter, subsequent encounter: Secondary | ICD-10-CM

## 2015-06-14 DIAGNOSIS — G809 Cerebral palsy, unspecified: Secondary | ICD-10-CM

## 2015-06-14 DIAGNOSIS — T8351XA Infection and inflammatory reaction due to indwelling urinary catheter, initial encounter: Secondary | ICD-10-CM

## 2015-06-14 DIAGNOSIS — Z9350 Unspecified cystostomy status: Secondary | ICD-10-CM

## 2015-06-14 DIAGNOSIS — I2699 Other pulmonary embolism without acute cor pulmonale: Secondary | ICD-10-CM

## 2015-06-14 DIAGNOSIS — N390 Urinary tract infection, site not specified: Secondary | ICD-10-CM

## 2015-06-14 DIAGNOSIS — Z9359 Other cystostomy status: Secondary | ICD-10-CM

## 2015-06-14 DIAGNOSIS — N10 Acute tubulo-interstitial nephritis: Secondary | ICD-10-CM

## 2015-06-14 DIAGNOSIS — G801 Spastic diplegic cerebral palsy: Secondary | ICD-10-CM

## 2015-06-14 DIAGNOSIS — T8351XD Infection and inflammatory reaction due to indwelling urinary catheter, subsequent encounter: Secondary | ICD-10-CM

## 2015-06-14 LAB — URINE CULTURE

## 2015-06-14 LAB — CULTURE, BLOOD (ROUTINE X 2)
Culture: NO GROWTH
Culture: NO GROWTH

## 2015-06-14 MED ORDER — AMOXICILLIN 500 MG PO CAPS
500.0000 mg | ORAL_CAPSULE | Freq: Two times a day (BID) | ORAL | Status: DC
  Administered 2015-06-14: 500 mg via ORAL
  Filled 2015-06-14 (×2): qty 1

## 2015-06-14 MED ORDER — AMOXICILLIN 500 MG PO CAPS: 500.0000 mg | ORAL_CAPSULE | Freq: Two times a day (BID) | ORAL | Status: AC

## 2015-06-14 NOTE — Assessment & Plan Note (Signed)
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 Continue management of spasm/contractures with baclofen pump, continue zanaflex. Pain control with percocet, constipation control with colace.

## 2015-06-14 NOTE — Assessment & Plan Note (Signed)
With complications including leakage around urostomy site despite larger caliber catheter insertion and migration into urethra. Urology consultant's assessment was that the distal catheter migration was related to the patulousness of the urethral orifice and is likely to be an ongoing problem. This will be monitored and urology f/u scheduled.

## 2015-06-14 NOTE — Plan of Care (Signed)
Problem: Phase I Progression Outcomes Goal: OOB as tolerated unless otherwise ordered Outcome: Not Applicable Date Met:  06/14/15 Pt is total care Goal: Voiding-avoid urinary catheter unless indicated Outcome: Not Applicable Date Met:  06/14/15 Chronic suprapubic catheter     

## 2015-06-14 NOTE — Discharge Instructions (Signed)
You were admitted to the hospital with an infection from your catheter site. We started you on antibiotics for this. We also changed your catheter while here. [facility] Please take the full course of your antibiotic even if you are feeling better.  Urosepsis Urosepsis is an infection that has spread to the bloodstream from the urinary tract. It is a severe illness. If it is not treated immediately, urosepsis can be life threatening. CAUSES  The cause of urosepsis is not known.  RISK FACTORS  Advanced age.  Having diabetes mellitus.  Having a weakened immune system.  Having kidney stones. SIGNS AND SYMPTOMS   Fever or low body temperature (hypothermia).  Rapid breathing (hyperventilation).  Chills.  Rapid heartbeat (tachycardia). When sepsis is severe, low blood pressure and decreased oxygen flow to your vital organs may also occur. This is called shock. DIAGNOSIS  Your health care provider may suspect urosepsis based on your medical history and a physical exam. Urine and blood tests may be done to confirm a diagnosis of urosepsis. Other tests, such as X-ray exams, ultrasound exams, or a CT scan, may be done to determine the severity of your condition.  TREATMENT  Urosepsis requires prompt treatment with medicines. You will receive fluids through an IV tube to support your blood pressure. You may also receive medicines to increase your blood pressure and medicines to control pain or nausea.  HOME CARE INSTRUCTIONS   Take medicines only as directed by your health care provider.  Drink enough fluids to keep your urine clear or pale yellow. In addition to water, cranberry juice is recommended.  Avoid caffeine, tea, and carbonated beverages. They can irritate the bladder.  Avoid alcohol. It may irritate the prostate, if this applies.  Get plenty of rest. Increase your activity as tolerated.  Keep all follow-up visits as directed by your health care  provider.  Empty your bladder often. Avoid holding urine for long periods of time.  Empty your bladder before and after sexual intercourse.  After a bowel movement, women should wipe from front to back using each tissue only once. SEEK MEDICAL CARE IF:   You develop back pain.  You develop nausea or vomiting.  Your symptoms are not better after 3 days.  You develop a fever or chills. SEEK IMMEDIATE MEDICAL CARE IF:   You feel light-headed or develop shortness of breath.  You are getting worse, not better. MAKE SURE YOU:  Understand these instructions.  Will watch your condition.  Will get help right away if you are not doing well or get worse. Document Released: 11/18/2005 Document Revised: 04/04/2014 Document Reviewed: 07/26/2013 [facility] Patient Information 2015 [place]. This information is not intended to replace advice given to you by your health care provider. Make sure you discuss any questions you have with your health care provider.

## 2015-06-14 NOTE — Progress Notes (Signed)
Family Medicine Teaching Service Nursing Home Admission History and Physical Service Pager: [phone]  Patient name: [name] [name] Medical record number: [id number] Date of birth: 12/18/1975 Age: 39 y.o. Gender: female  Primary Care Provider: Devota Pace, MD Consultants: [facility] Neurology, Urology Code Status: Full code  Chief Complaint: Return to NH following admission for septic shock due to UTI.  Assessment and Plan: Damariz Paganelli is a 39 y.o. female returning to [facility] NH after admission for septic shock due to complicated UTI.She has a hx of cerebral palsy, neurogenic bladder with chronic suprapubic catheter, major depression, OCD, recurrent pulmonary embolism.  Ms. Connors complained of suprapubic urinary catheter pain worse than baseline and urinary leakage around point of insertion of suprapubic catheter on 7/6 and so was taken to [facility] for evaluation. There she was found to be hypotensive without improvement with 3L NS boluses, was started on pressors and admitted to the ICU. Blood were drawn at that time and remained negative but urine culture eventually grew enterococcus (30k colonies/mL). She was treated with vancomycin/zosyn which was transitioned on 7/8 to vancomycin/cefepime when she showed improvement. She was transferred to the medical floor 7/9 with resolution of sepsis and vancomycin was stopped. She was transferred to the family medicine teaching service 7/10. The tip of her suprapubic catheter migrated distally into the urethra despite being fastened with a stat lock. 22Fr suprapubic catheter was exchanged for a 24Fr on 7/12 though migration and leaking around catheter-urostomy site continued. She was given norco 5/325mg  q4h prn suprapubic pain. On 7/13 cefepime was transitioned to amoxicillin and she was discharged back to [facility].   Today she states she feels well and doesn't really remember much about being in the hospital. Has some pain around the site but overall  feels well.   Review Of Systems: Per HPI. Otherwise 12 point review of systems was performed and was unremarkable.  Patient Active Problem List   Diagnosis Date Noted  . Person living in residential institution 06/14/2015  . Abdominal pain   . Acute cystitis with hematuria   . Septic shock   . Acute cystitis without hematuria   . Pulmonary hypertension   . Anemia of chronic disease   . Sepsis 06/09/2015  . Suprapubic catheter 05/16/2015  . Pyogenic granuloma 03/27/2015  . Spasticity 03/15/2015  . Blister of finger 02/18/2015  . Irritant injury of right ear 01/11/2015  . Constipation, chronic 10/10/2014  . Athetoid cerebral palsy 04/13/2014  . Dystonia 04/12/2014  . Presence of intrathecal baclofen pump 03/07/2014  . Nursing home resident 09/16/2013  . Dental caries 07/18/2013  . Multiple thyroid nodules 05/20/2013  . Osteoarthrosis 04/07/2013  . [name]   . Dysarthria   . GERD (gastroesophageal reflux disease)   . Neurogenic bladder 02/05/2013  . Anxiety state 10/21/2012  . Healed or old pulmonary embolism 10/21/2012  . Chronic pain syndrome 06/17/2012  . Recurrent pulmonary embolism 05/03/2011  . Chronic suprapubic catheter 03/15/2011  . Long term (current) use of anticoagulants 01/12/2011  . Disease of female genital organs 10/24/2010  . HYPERTENSION, BENIGN 01/31/2010  . CP (cerebral palsy), spastic 12/27/2008  . Contracted, joint, multiple sites 12/27/2008  . Major depressive disorder, recurrent episode 01/29/2007  . Obsessive-compulsive disorder 01/29/2007  . Obsessive Compulsive Disorder 01/29/2007  . Transient alteration of awareness, recurrent 09/08/2006   Past Medical History: Past Medical History  Diagnosis Date  . Cerebral palsy   . Pulmonary embolism     Lifetime Coumadin  . Cerebral palsy  Spastic Cerebral palsy, mentally intact  . Contracture, joint, multiple sites     Electric wheelchair, uses left hand to operate  chair.   [name] Kitchen Hypertension   . OCD (obsessive compulsive disorder)   . Depression   . Migraine   . [name]   . Dysarthria   . Endometriosis   . History of recurrent UTIs   . GERD (gastroesophageal reflux disease)   . Pulmonary embolism 2011, 01/2011    Will be on lifetime coumadin  . Anemia   . Abdominal pain 01/02/2012    Overview:  Overview:  Per Previous pcp note- Dr. Ta--Pt has history of endometriosis and had DUB in 2011.  She She had u/s that showed normal endometrial stripe.  She had endometrial bx that was wnl.  She has a lot of abd cramping every month.  This cramping was sometimes not relieved with hydrocodone.  She was referred to [facility] for IUD placement to help endometriosis and abd cramping.  [name]  . Macroscopic hematuria 03/10/2013    status post replacement of suprapubic tube 03/04/2013   . HYPERTENSION, BENIGN 01/31/2010    Qualifier: Diagnosis of  By: [name], MD, [name] History of endometriosis 10/2012    OBGYN [facility]: Laparoscopic Endometrial Ablation, TVH,  . Gram positive sepsis 10/24/2014  . HCAP (healthcare-associated pneumonia) 10/25/2014  . Abdominal pain 01/02/2012    Overview:  Overview:  Per Previous pcp note- Dr. Ta--Pt has history of endometriosis and had DUB in 2011.  She She had u/s that showed normal endometrial stripe.  She had endometrial bx that was wnl.  She has a lot of abd cramping every month.  This cramping was sometimes not relieved with hydrocodone.  She was referred to [facility] for IUD placement to help endometriosis and abd cramping.  [name] was placed 04/30/11 by [facility].  Pt still complains of abd cramping, which I am treating with hydrocodone and ultram. Pt reports some initial relief of pain from IUD, but states it has now returned back to similar to previous cramping. Gave patient a shot of Toradol 60 mg IM today-since positive exacerbation of abdominal cramping during past 2 weeks. Physical exam reassuring.  Patient may need to return to [facility] for further workup since she is plugged in with the GYN providers there for further treatment options regarding endometriosis and abdominal cramping. The dysfunctional uterine bleeding now well contr  . Acute respiratory failure with hypoxia   . Cellulitis 12/22/2014  . Disease of female genital organs 10/24/2010    Overview:  Overview:  Pt has history of endometriosis and had DUB in 2011.  She had u/s that showed normal endometrial stripe.  She had endometrial bx that was wnl.  She has a lot of abd cramping every month.  This cramping was sometimes not relieved with hydrocodone.  She was referred to [facility] for IUD placement to help endometriosis and abd cramping.  [name] was placed 04/30/11 by [facility].  Pt still complains of abd cramping, which was treated with hydrocodone and ultram.   Last seen at [facility] by OB/GYN 12/24/11, given Depo Lupron injection.  Ordering CT scan to r/o additional cause of abdominal pain (pt with known endometriosis and will not tolerate vaginal U/S, pain not improved s/p [name] or with Lupron injection-- only caused hot flashes and amenorrhea, no pain relief).  Also made referral to pain clinic for better pain control.  Do not think hysterectomy would help with pain,  and concern for contamination of baclofen pump if did proceed with surgery.    8/5/13[name] OB/[facility]: Pt was seen by   . Dyspnea and respiratory abnormality 02/04/2011    Overview:  Overview:  Pt has history of recurrent PE and is on chronic coumadin.  Pt has been complaining of dyspnea that she describes as chest tightness.  She has been to the ER multiple times for this.  Usually she gets CTA and serial CE.  She has had at least 6 CTA in a period of since 2011.  She also has an O2 requirement. It is unclear why pt feels dyspneic, complains of chest tightness, or has O2 requirement.  I thought that her complaint may be from deconditioning and referred her to PT.   Unfortunately Medicaid only pays for 4 PT visits.  Pt should be doing these exercises at home, but she has not.  I have referred her to [facility], and she has been seen by [name].  He did not understand the etiology of her dyspnea and O2 requirement.  He will continue to follow her.   Last Assessment & Plan:  Pt presents with concerns for chest tightness and dyspnea.  She was seen in ED yesterday and discharged home.  While there she had a CT chest 04/20/11 showed 1.  Multifocal degradation as detailed above.  N  . Gross hematuria 03/12/2013    Overview:  Overview:  status post replacement of suprapubic tube 03/04/2013  Last Assessment & Plan:  Recent urine culture collected in ED on 5/4 did not result in significant growth. Moreover, patient has been asymptomatic with normal WBC, no fever and no true symptoms making symptomatic UTI less likely.  Will continue to monitor for any sign of infection.  Patient's xarelto had been held during her recent hospitalization due to hematuria. This has since been re-started. Monitor bleeding.    [name] Kitchen History of anticoagulant therapy 10/21/2012  . Infantile cerebral palsy 01/29/2007    Overview:  She has spastic CP.  She is mentally intact.  She is wheelchair bound.  She is wheelchair bound for ambulation      . Intracervical pessary 05/07/2011    Overview:  Overview:  Placed by [facility] GYN 04/30/11 to treat DUB and Endometriosis.  She is seen at GYN clinic at [facility] but was considered a poor surgical candidate and referred her to [facility].  For DUB she had pelvic ultrasound that showed thin stripe and she had endometrial Bx by Dr Jennette Kettle in GYN clinic, which was negative.     . Parapneumonic effusion 10/25/2014  . Presence of intrathecal baclofen pump 03/07/2014    R LQ. Insertion T10.    [name] Kitchen Psychiatric illness 10/31/2014  . Recurrent pulmonary embolism 05/03/2011    Pt has history of recurrent PE and is on chronic coumadin.  Pt has been complaining of dyspnea that she describes  as chest tightness.  She has been to the ER multiple times for this.  Usually she gets CTA and serial CE.  She has had at least 6 CTA in a period of since 2011.  She also has an O2 requirement. It is unclear why pt feels dyspneic, complains of chest tightness, or has O2 requirement.  I thought that her complaint may be from deconditioning and referred her to PT.  Unfortunately Medicaid only pays for 4 PT visits.  Pt should be doing these exercises at home, but she has not.  I have referred her to [place],  and she has been seen by [name].  He did not understand the etiology of her dyspnea and O2 requirement.  He will continue to follow her.     . Seborrheic keratosis 01/18/2011  . Spasticity 01/05/2014  . Transient alteration of awareness, recurrent 09/08/2006    Recurrent episodes where Edward  loses contact with her world and that have been studied thoroughly and appear nonepileptic in nature per Dr Terrace Arabia (neurology) The patient has had episodes starting in 2007 that initially began 1-4 times a day during which time she would have periods of unawareness followed by confusion. This continuous video EEG monitoring performed from October 8-12, 2007. Had    Past Surgical History: Past Surgical History  Procedure Laterality Date  . Colposcopy  06/2000  . Cesarean section      x 2  . Tubal ligation  2003  . Carpal tunnel release  08/2008    Dr Teressa Senter  . Wrist surgery  06/2010    Dr Dierdre Searles, hand surgeon, [name]  . Baclofen pump refill      x 3 times  . Appendectomy    . Intrauterine device insertion  04/30/11    Inserted by [facility] GYN for endometriosis  . Laparoscopically assisted vag hysterectomy  10/27/2012  . Multiple extractions with alveoloplasty Bilateral 07/19/2013    Procedure: EXTRACTIONS #4, 1,61,09,60;  Surgeon: Francene Finders, DDS;  Location: [facility] OR;  Service: Oral Surgery;  Laterality: Bilateral;  . Pain pump implantation N/A 03/07/2014    Procedure: baclofen pump  revision/replacement and Catheter connection replacement;  Surgeon: Cristi Loron, MD;  Location: [facility] NEUR[facility] ORS;  Service: Neurosurgery;  Laterality: N/A;  baclofen pump revision/replacement and Catheter connection replacement  . Programable baclofen pump revision  03/17/14    Battery Replacement    Social History: History  Substance Use Topics  . Smoking status: Never Smoker   . Smokeless tobacco: Never Used  . Alcohol Use: 0.6 oz/week    1 Glasses of wine per week     Comment: Drinks alcohol once a month.   Please also refer to relevant sections of EMR.  Family History: Family History  Problem Relation Age of Onset  . Asthma Father   . Colon cancer Maternal Grandmother     Died in her 103's  . Cancer Paternal Grandmother   . Breast cancer Paternal Grandmother     Died in her 33's  . Heart attack Maternal Grandfather     Died in his 59's  . Alzheimer's disease Paternal Grandfather     Died in his 71's   Allergies and Medications: Allergies  Allergen Reactions  . Morphine Dermatitis    Skin turned red   Current Facility-Administered Medications on File Prior to Visit  Medication Dose Route Frequency Provider Last Rate Last Dose  . 0.9 %  sodium chloride infusion  250 mL Intravenous PRN Araceli Bouche, DO   Stopped at 06/09/15 1500  . acetaminophen (TYLENOL) tablet 650 mg  650 mg Oral Q8H PRN Wahoo N Rumley, DO   650 mg at 06/09/15 0600  . ARIPiprazole (ABILIFY) tablet 1 mg  1 mg Oral QHS Seymour N Rumley, DO   1 mg at 06/13/15 2353  . baclofen (LIORESAL) tablet 20 mg  20 mg Oral 4 times per day [facility], DO   20 mg at 06/14/15 0630  . ceFEPIme (MAXIPIME) 1 g in dextrose 5 % 50 mL IVPB  1 g Intravenous 3 times per day Vineet  Sood, MD   1 g at 06/14/15 0006  . darifenacin (ENABLEX) 24 hr tablet 15 mg  15 mg Oral Daily Abram Sander, MD   15 mg at 06/13/15 1357  . famotidine (PEPCID) tablet 20 mg  20 mg Oral BID [facility], DO   20 mg at 06/13/15 2354  .  flavoxATE (URISPAS) tablet 100 mg  100 mg Oral TID PRN [facility], DO   100 mg at 06/10/15 1610  . FLUoxetine (PROZAC) capsule 10 mg  10 mg Oral Daily [facility], DO   10 mg at 06/13/15 0957  . HYDROcodone-acetaminophen (NORCO/VICODIN) 5-325 MG per tablet 1 tablet  1 tablet Oral Q6H PRN Abram Sander, MD      . polymixin-bacitracin (POLYSPORIN) ointment 1 application  1 application Topical BID Araceli Bouche, DO   1 application at 06/13/15 2354  . QUEtiapine (SEROQUEL) tablet 100 mg  100 mg Oral QHS Bonney Aid, MD   100 mg at 06/13/15 2354  . rivaroxaban (XARELTO) tablet 20 mg  20 mg Oral Q supper [facility], DO   20 mg at 06/13/15 1706  . senna-docusate (Senokot-S) tablet 1 tablet  1 tablet Oral BID Araceli Bouche, DO   1 tablet at 06/13/15 2354  . tiZANidine (ZANAFLEX) tablet 4 mg  4 mg Oral 3 times per day Araceli Bouche, DO   4 mg at 06/14/15 9604   Current Outpatient Prescriptions on File Prior to Visit  Medication Sig Dispense Refill  . acetaminophen (TYLENOL) 325 MG tablet Take 650 mg by mouth every 8 (eight) hours as needed for mild pain.     . ARIPiprazole (ABILIFY PO) Take 1 tablet by mouth at bedtime. 1mg  tablet per MAR    . bacitracin-polymyxin b (POLYSPORIN) ointment Apply 1 g topically 2 (two) times daily. Apply around Suprapubic ostomy site    . baclofen (LIORESAL) 10 MG tablet Take 20 mg by mouth 4 (four) times daily.     . clonazePAM (KLONOPIN) 1 MG tablet Take 1 tablet (1 mg total) by mouth 4 (four) times daily. (Patient not taking: Reported on 06/08/2015) 120 tablet 5  . flavoxATE (URISPAS) 100 MG tablet Take 100 mg by mouth 3 (three) times daily as needed for bladder spasms.    [name] Kitchen FLUoxetine (PROZAC) 10 MG capsule Take 10 mg by mouth daily.    [name] Kitchen HYDROcodone-acetaminophen (NORCO/VICODIN) 5-325 MG per tablet Take 1 tablet by mouth every 6 (six) hours as needed (pain in both knees). 60 tablet 0  . ibuprofen (ADVIL,MOTRIN) 200 MG tablet Take 400 mg by mouth  every 8 (eight) hours as needed for mild pain.     . Multiple Vitamin (MULTIVITAMIN) tablet Take 1 tablet by mouth daily.    . rivaroxaban (XARELTO) 20 MG TABS tablet Take 20 mg by mouth daily with supper.    . senna-docusate (SENOKOT-S) 8.6-50 MG per tablet Take 1 tablet by mouth 2 (two) times daily.    . SUMAtriptan (IMITREX) 100 MG tablet Take 100 mg by mouth every 2 (two) hours as needed for migraine or headache. May repeat in 2 hours if headache persists or recurs.    [name] Kitchen tiZANidine (ZANAFLEX) 4 MG tablet Take 4 mg by mouth 3 (three) times daily. Admin at 0430, 1730 and 1930 hrs      Objective: LMP 04/05/2011 Exam: General: Spastic, well-appearing 39 yo female in no distress  ENTM: MMM, oropharynx clear Neck: Supple Cardiovascular: Normal rate, no murmurs.  No JVD. no LE edema.  Respiratory: Nonlabored, clear breath sounds Abdomen: +BS, soft, nondistended. Tender to palpation worst near to urostomy site which is noninfected-appearing. MSK: Aside from spasm and contractures, no deformities noted. Skin: As above, otherwise no rashes or wounds noted.  Neuro: Spastic upper extremities. Alert and oriented. Difficult to interpret speech which is at her baseline.   Labs and Imaging: CBC BMET   Recent Labs Lab 06/12/15 0419  WBC 8.1  HGB 9.8*  HCT 30.5*  PLT 321    Recent Labs Lab 06/12/15 0419  NA 137  K 3.9  CL 103  CO2 26  BUN 7  CREATININE 0.40*  GLUCOSE 101*  CALCIUM 8.0*     Urinalysis    Component Value Date/Time   COLORURINE RED* 06/08/2015 2248   APPEARANCEUR TURBID* 06/08/2015 2248   LABSPEC 1.027 06/08/2015 2248   PHURINE 7.5 06/08/2015 2248   GLUCOSEU NEGATIVE 06/08/2015 2248   HGBUR LARGE* 06/08/2015 2248   HGBUR large 01/26/2014   HGBUR large 10/03/2010 1530   BILIRUBINUR SMALL* 06/08/2015 2248   BILIRUBINUR neg 03/15/2011 1719   KETONESUR 15* 06/08/2015 2248   PROTEINUR >300* 06/08/2015 2248   PROTEINUR 100 01/26/2014   PROTEINUR neg 03/15/2011  1719   UROBILINOGEN 1.0 06/08/2015 2248   UROBILINOGEN 0.2 03/15/2011 1719   NITRITE POSITIVE* 06/08/2015 2248   NITRITE neg 03/15/2011 1719   LEUKOCYTESUR LARGE* 06/08/2015 2248   LEUKOCYTESUR mod 01/26/2014   Urine Cx (7/7): multiple organisms, none predominant Urine Cx (7/10): 30,000 colonies / ml Enterococcus species (sensitive to all tested abx)  Tyrone Nineyan B Daiki Dicostanzo, MD 06/14/2015, 8:46 AM PGY-3, [facility]

## 2015-06-14 NOTE — Progress Notes (Signed)
Physical Therapy Treatment Patient Details Name: [name] SlickerJudith Howson MRN: [id number] DOB: 03/24/1976 Today's Date: 06/14/2015    History of Present Illness Pt is a 39 y/o female with a PMH significant for spastic CP, PE, and contractures of multiple joints. Pt has a suprapubic catheter and Baclofen pump.     PT Comments    Pt not progressing and feel that she is close to baseline.  She could benefit from continued daily stretching to BLEs/UEs to decrease tone.  PT to sign off at this time.  Probable D/C to SNF today.   Follow Up Recommendations  SNF;Supervision/Assistance - 24 hour     Equipment Recommendations  None recommended by PT    Recommendations for Other Services       Precautions / Restrictions Precautions Precautions: Fall Precaution Comments: spastic CP R>L Restrictions Weight Bearing Restrictions: No    Mobility  Bed Mobility Overal bed mobility: Needs Assistance Bed Mobility: Rolling;Sidelying to Sit;Sit to Supine Rolling: Mod assist Sidelying to sit: +2 for physical assistance;Mod assist   Sit to supine: +2 for physical assistance;Max assist   General bed mobility comments: Pt able to initiate rolling with use of rail, however requires assist for lowering BLEs out of bed and also for elevating trunk into sitting.  Requires more assist to lie down for controlled descent.   Transfers Overall transfer level:  (Pt declined getting to recliner due to not being comfortable)                  Ambulation/Gait                 Stairs            Wheelchair Mobility    Modified Rankin (Stroke Patients Only)       Balance Overall balance assessment: Needs assistance Sitting-balance support: Single extremity supported Sitting balance-Leahy Scale: Poor Sitting balance - Comments: Requires up to mod A to remain sitting at EOB x 10 mins.                              Cognition Arousal/Alertness: Awake/alert Behavior During Therapy:  WFL for tasks assessed/performed Overall Cognitive Status: Within Functional Limits for tasks assessed                      Exercises Other Exercises Other Exercises: Performed B HC stretch x 30 secs, knee flex stretch x 30 sec, and R hip abd stretch x 30 sec    General Comments        Pertinent Vitals/Pain Pain Assessment: No/denies pain    Home Living Family/patient expects to be discharged to:: Skilled nursing facility Living Arrangements: Other (Comment)                  Prior Function            PT Goals (current goals can now be found in the care plan section) Acute Rehab PT Goals Patient Stated Goal: Get more therapy PT Goal Formulation: With patient Time For Goal Achievement: 06/16/15 Potential to Achieve Goals: Good Progress towards PT goals: Not progressing toward goals - comment (will likely need PROM program, however feel at baseline)    Frequency  Min 1X/week    PT Plan Current plan remains appropriate    Co-evaluation             End of Session   Activity Tolerance: Patient tolerated treatment well  Patient left: in bed;with call bell/phone within reach     Time: 1424-1445 PT Time Calculation (min) (ACUTE ONLY): 21 min  Charges:  $Therapeutic Activity: 8-22 mins                    G Codes:      Vista Deck 06/14/2015, 3:10 PM

## 2015-06-14 NOTE — Assessment & Plan Note (Addendum)
Chronic suprapubic catheter-associated enterococcal UTI: Septic shock resolved. To complete a 10-day total course of abx with amoxicillin. Last dose will be on 7/17.

## 2015-06-14 NOTE — Progress Notes (Signed)
Family Medicine Teaching Service Daily Progress Note Intern Pager: [phone]  Patient name: [name] [name] Medical record number: [id number] Date of birth: August 09, 1976 Age: 39 y.o. Gender: female  Primary Care Provider: Devota Pace, MD Consultants: urology Code Status: will need to ask patient  Pt Overview and Major Events to Date:  7/10: Transferred to family medicine service  Assessment and Plan: [name] is a 39yo female presenting from [facility] with complaint of suprapubic urinary catheter pain.  UTI: Pt has history of chronic UTI and Neurogenic bladder. Was noted to have sepsis earlier in hospital course that has not resolved. Last catheter change was 7/6. She follows up with [facility] for botox injections to bladder. Her catheter has been malfunctioning. It has been repositioned by urology but urine is still leaking out. Currently taking Cefepime (Day 4) -Attempt to put a size 24 catheter in place of the one in patient  -If 24 does not work, consult urology again -Will need to follow up with outpatient urology -Consider transitioning abx; urine culture revealed 30,000 COLONIES/mL ENTEROCOCCUS SPECIES -Monitor abdominal pain, Vicodin prescribed q4 PRN  Septic Shock -hypotension has improved.  Continue to monitor BP -Continue to monitor other vital signs which have been normal -Continue to monitor mental status which is currently normal -Follow WBC   Spastic Cerebral Palsy -Continue Baclofen pump.  -Home medications include Baclofen and zanaflex   FEN/GI: regular diet PPx: xarelto  Disposition: stable  Subjective:  [name] [name] is a 39yo female presenting from [facility] with complaint of suprapubic urinary catheter pain. This morning she is not complaining of abdominal pain. She states that she feels like the catheter is "coming out of my vagina". I checked, and the catheter tip was hanging out of the ureter so I pulled it back with the  assistance of the nurse and taped it to the side of her leg. I discussed with patient transition to [facility] today and she is agreeable. Her suprapubic catheter is still leaking. She will need to follow up with outpatient urology. Objective: Temp:  [98.3 F (36.8 C)-98.6 F (37 C)] 98.3 F (36.8 C) (07/13 0512) Pulse Rate:  [75-90] 75 (07/13 0512) Resp:  [17-20] 18 (07/13 0512) BP: (142-146)/(61-69) 142/64 mmHg (07/13 0512) SpO2:  [94 %-98 %] 98 % (07/13 0512) Weight:  [135 lb 14.4 oz (61.643 kg)] 135 lb 14.4 oz (61.643 kg) (07/13 0512) Physical Exam: General: well developed, well nourished, no acute distress. Dysarthria secondary to CP Cardiovascular: RRR, normal s1 and s2, no rubs, gallops, or murmurs Respiratory: Clear to auscultation bilaterally without wheezes or crackles Abdomen: soft, nondistended, positive bowel sounds. suprapubic catheter in place with significant amount of urine leaking around catheter. Positive suprapubic pain upon palpation. Inrtathecal Baclofen pump palpable on R abdomen Extremities: no edema   Laboratory:  Recent Labs Lab 06/10/15 0216 06/11/15 0255 06/12/15 0419  WBC 7.1 6.7 8.1  HGB 9.2* 9.8* 9.8*  HCT 29.3* 30.9* 30.5*  PLT 304 296 321    Recent Labs Lab 06/10/15 0216 06/11/15 0255 06/12/15 0419  NA 139 138 137  K 3.7 3.7 3.9  CL 105 104 103  CO2 BUN <5* <5* 7  CREATININE 0.53 0.39* 0.40*  CALCIUM 8.2* 8.3* 8.0*  PROT 5.2* 5.5*  --   BILITOT 0.6 0.4  --   ALKPHOS 58 58  --   ALT 11* 10*  --   AST 16 13*  --   GLUCOSE 105* 118* 101*  Imaging/Diagnostic Tests: No results found.   Beaulah [name] [name] Tavarus Poteete, MD 06/14/2015, 7:29 AM PGY-1, [place] FPTS Intern pager: [phone], text pages welcome

## 2015-06-14 NOTE — Assessment & Plan Note (Signed)
No signs of bleeding or recurrent clot. Continue xarelto.

## 2015-06-14 NOTE — Progress Notes (Signed)
Dressing changed around site of suprapubic catheter earlier in shift. When changing dressing there was moderate amount of urine drainage but not able to tell if from suprapubic catheter site or from vaginal area. This morning, no urine drainage from catheter site but noticed moderate amount of urine drainage from vaginal area for half of a chuck/paper pad was moistened with urine. Patient also states she feels a sensation as if the foley was in the vagina. Assessed area and no tubing seen. Tried to reassure patient that foley catheter is not in vaginal area. Nurse made note in sticky note for doctor to address when making rounds. Will continue to monitor patient to end of shift.

## 2015-06-14 NOTE — Assessment & Plan Note (Signed)
[facility]

## 2015-06-14 NOTE — Progress Notes (Signed)
OK per MD for d/c today [facility]. Notified Nikki- [facility]. Ok per patient and her father [name] for d/c today.  EMS arranged for pick up and nursing notified to call report. No further CSW needs identified and will sign off.  Lorri Frederickonna T. Jaci LazierCrowder, [place] [phone]

## 2015-06-14 NOTE — Discharge Summary (Signed)
Family Medicine Teaching [facility] Discharge Summary  Patient name: [name] SlickerJudith [name] Medical record number: [id number] Date of birth: Jul 11, 1976 Age: 39 y.o. Gender: female Date of Admission: 06/08/2015  Date of Discharge: 06/14/15 Admitting Physician: Coralyn HellingVineet Sood, MD  Primary Care Provider: Devota Pacealeb Melancon, MD Consultants: urology, CCM  Indication for Hospitalization: suspected septic shock  Discharge Diagnoses/Problem List:  Patient Active Problem List   Diagnosis Date Noted  . Person living in residential institution 06/14/2015  . Urinary tract infectious disease   . Cerebral palsy   . Abdominal pain   . Acute cystitis with hematuria   . Septic shock   . Acute cystitis without hematuria   . Pulmonary hypertension   . Anemia of chronic disease   . Sepsis 06/09/2015  . Suprapubic catheter 05/16/2015  . Pyogenic granuloma 03/27/2015  . Spasticity 03/15/2015  . Blister of finger 02/18/2015  . Irritant injury of right ear 01/11/2015  . Constipation, chronic 10/10/2014  . Athetoid cerebral palsy 04/13/2014  . Dystonia 04/12/2014  . Presence of intrathecal baclofen pump 03/07/2014  . Nursing home resident 09/16/2013  . Dental caries 07/18/2013  . Multiple thyroid nodules 05/20/2013  . Osteoarthrosis 04/07/2013  . [name]   . Dysarthria   . GERD (gastroesophageal reflux disease)   . Neurogenic bladder 02/05/2013  . Anxiety state 10/21/2012  . Healed or old pulmonary embolism 10/21/2012  . Chronic pain syndrome 06/17/2012  . Recurrent pulmonary embolism 05/03/2011  . Chronic suprapubic catheter 03/15/2011  . Long term (current) use of anticoagulants 01/12/2011  . Disease of female genital organs 10/24/2010  . HYPERTENSION, BENIGN 01/31/2010  . CP (cerebral palsy), spastic 12/27/2008  . Contracted, joint, multiple sites 12/27/2008  . Major depressive disorder, recurrent episode 01/29/2007  . Obsessive-compulsive disorder 01/29/2007  . Obsessive  Compulsive Disorder 01/29/2007  . Transient alteration of awareness, recurrent 09/08/2006    Disposition: SNF  Discharge Condition: stable  Discharge Exam:  General: well developed, well nourished, no acute distress. Dysarthria secondary to CP Cardiovascular: RRR, normal s1 and s2, no rubs, gallops, or murmurs Respiratory: Clear to auscultation bilaterally without wheezes or crackles Abdomen: soft, nondistended, positive bowel sounds, non tender to palpation. suprapubic catheter in place with urine leaking around catheter. Inrtathecal Baclofen pump palpable on R abdomen Extremities: no edema, brisk pulses bilaterally  Brief Hospital Course:   Urosepsis Patient [facility] with complaints of suprapubic catheter pain that became worse on 7/6 and was admitted to Critical Care for suspected sepsis.  Patient arrived stable and alert and oriented x4. In the ED her BP decreased and got as low as 68/39.Ultrasound IV placement was performed. She was given 3L NS boluses with no improvement. Levophed was then given for 2 days until pressure stabilized. She also had transient alteration in alertness most likely secondary to anxiety. She remained afebrile. UA revealed many bacteria, large leukocytes, positive nitrites, >300 protein and she was treated for UTI. Urine cultures and blood cultures were ordered. Blood cx later came back with no growth. Urine cx was positive for enterococci. Catheter was changed twice. Antibiotics given: Vancomycin 7/7-7/9 (3 days), Zosyn 7/7 (1 day), Cefepime 7/8-7/13 (6 days), Amoxicillin 7/13 (will need to take for 4 days). At the time of discharge, patient was stable on amoxicillin.   Chronic indwelling suprapubic catheter secondary to neurogenic bladder Patient's suprapubic catheter tip and balloon migrated out of her urethra multiple times and was pulled back. Additionally there was leakage around the suprapubic stoma.  Urology was consulted,  they secured catheter to abdominal wall just under left breast with stat lock to help prevent migration distally. This did not seem to help and catheter continued to slip through ureter throughout stay. A 24 French catheter was inserted but this did not seem to fix the stoma leakage and catheter migration even after taping the catheter to skin. Patient's suprapubic pain waxed and waned throughout hospitalization. She was placed on PO norco/vicodin 5-325 q 6 PRN for suprapubic pain.   No changes were made to pre-existing chronic conditions.   Issues for Follow Up:  1. Neurogenic bladder with suprapubic catheter; catheter is not secure, will need to follow up with urology  2. Patient discharged to complete PO amoxicillin until 7/18 to complete total 10 day course for presumed urosepsis secondary to enterococcus UTI.   Significant Procedures:  1. Angiocath insertion  Significant Labs and Imaging:   Recent Labs Lab 06/10/15 0216 06/11/15 0255 06/12/15 0419  WBC 7.1 6.7 8.1  HGB 9.2* 9.8* 9.8*  HCT 29.3* 30.9* 30.5*  PLT 304 296 321    Recent Labs Lab 06/08/15 2225 06/10/15 0216 06/11/15 0255 06/12/15 0419  NA 136 139 138 137  K 4.1 3.7 3.7 3.9  CL 101 105 104 103  CO2 GLUCOSE 107* 105* 118* 101*  BUN 13 <5* <5* 7  CREATININE 0.54 0.53 0.39* 0.40*  CALCIUM 8.5* 8.2* 8.3* 8.0*  MG  --   --  1.7  --   ALKPHOS  --  58 58  --   AST  --  16 13*  --   ALT  --  11* 10*  --   ALBUMIN  --  2.7* 2.8*  --    Urinalysis    Component Value Date/Time   COLORURINE RED* 06/08/2015 2248   APPEARANCEUR TURBID* 06/08/2015 2248   LABSPEC 1.027 06/08/2015 2248   PHURINE 7.5 06/08/2015 2248   GLUCOSEU NEGATIVE 06/08/2015 2248   HGBUR LARGE* 06/08/2015 2248   HGBUR large 01/26/2014   HGBUR large 10/03/2010 1530   BILIRUBINUR SMALL* 06/08/2015 2248   BILIRUBINUR neg 03/15/2011 1719   KETONESUR 15* 06/08/2015 2248   PROTEINUR >300* 06/08/2015 2248   PROTEINUR 100 01/26/2014    PROTEINUR neg 03/15/2011 1719   UROBILINOGEN 1.0 06/08/2015 2248   UROBILINOGEN 0.2 03/15/2011 1719   NITRITE POSITIVE* 06/08/2015 2248   NITRITE neg 03/15/2011 1719   LEUKOCYTESUR LARGE* 06/08/2015 2248   LEUKOCYTESUR mod 01/26/2014   Urine Culture: Enterococcus  Results/Tests Pending at Time of Discharge: none  Discharge Medications:    Medication List    TAKE these medications        ABILIFY PO  Take 1 tablet by mouth at bedtime.  tablet per [facility]     acetaminophen 325 MG tablet  Commonly known as:  TYLENOL  Take 650 mg by mouth every 8 (eight) hours as needed for mild pain.     amoxicillin 500 MG capsule  Commonly known as:  AMOXIL  Take 1 capsule (500 mg total) by mouth every 12 (twelve) hours.     bacitracin-polymyxin b ointment  Commonly known as:  POLYSPORIN  Apply 1 g topically 2 (two) times daily. Apply around Suprapubic ostomy site     baclofen 10 MG tablet  Commonly known as:  LIORESAL  Take 20 mg by mouth 4 (four) times daily.     clonazePAM 1 MG tablet  Commonly known as:  KLONOPIN  Take 1 tablet (1  mg total) by mouth 4 (four) times daily.     flavoxATE 100 MG tablet  Commonly known as:  URISPAS  Take 100 mg by mouth 3 (three) times daily as needed for bladder spasms.     FLUoxetine 10 MG capsule  Commonly known as:  PROZAC  Take 10 mg by mouth daily.     HYDROcodone-acetaminophen 5-325 MG per tablet  Commonly known as:  NORCO/VICODIN  Take 1 tablet by mouth every 6 (six) hours as needed (pain in both knees).     ibuprofen 200 MG tablet  Commonly known as:  ADVIL,MOTRIN  Take 400 mg by mouth every 8 (eight) hours as needed for mild pain.     IMITREX 100 MG tablet  Generic drug:  SUMAtriptan  Take 100 mg by mouth every 2 (two) hours as needed for migraine or headache. May repeat in 2 hours if headache persists or recurs.     multivitamin tablet  Take 1 tablet by mouth daily.     rivaroxaban 20 MG Tabs tablet  Commonly known as:   XARELTO  Take 20 mg by mouth daily with supper.     senna-docusate 8.6-50 MG per tablet  Commonly known as:  Senokot-S  Take 1 tablet by mouth 2 (two) times daily.     tiZANidine 4 MG tablet  Commonly known as:  ZANAFLEX  Take 4 mg by mouth 3 (three) times daily. Admin at 0430, 1730 and 1930 hrs        Discharge Instructions: Please refer to Patient Instructions section of EMR for full details.  Patient was counseled important signs and symptoms that should prompt return to medical care, changes in medications, dietary instructions, activity restrictions, and follow up appointments.   Follow-Up Appointments:     Follow-up Information    Schedule an appointment as soon as possible for a visit with Devota Pace, MD.   Specialty:  Family Medicine   Contact information:   [address] [place] [address] [phone]       Beaulah Dinning, MD 06/14/2015, 2:40 PM PGY-1, [facility]

## 2015-06-14 NOTE — Assessment & Plan Note (Addendum)
with recurrent complicated UTIs: ?consider ppx abx. [facility] (Dr. Logan BoresEvans) where she gets botox injections and catheter care. Restarted oxybutynin 5mg  TID per inpatient urology consultant. She was placed on vicodin 5-325mg  q6h prn suprapubic pain and this will be continued.

## 2015-06-14 NOTE — Progress Notes (Addendum)
Called [phone], and gave report to Lupita Leashonna, RN, who will be taking [facility] of [facility].  IV has been removed without complication.  Suprapubic catheter is to remain in place at discharge, per order.  Pt is in no acute distress.

## 2015-06-15 ENCOUNTER — Telehealth: Payer: Self-pay

## 2015-06-15 ENCOUNTER — Ambulatory Visit: Payer: Medicaid Other | Admitting: Neurology

## 2015-06-15 NOTE — Telephone Encounter (Signed)
Called patient r/s botox appt.

## 2015-06-19 ENCOUNTER — Ambulatory Visit: Payer: Medicaid Other | Admitting: Family Medicine

## 2015-06-21 ENCOUNTER — Non-Acute Institutional Stay: Payer: Medicaid Other | Admitting: Family Medicine

## 2015-06-21 DIAGNOSIS — I10 Essential (primary) hypertension: Secondary | ICD-10-CM

## 2015-06-21 DIAGNOSIS — F42 Obsessive-compulsive disorder: Secondary | ICD-10-CM | POA: Diagnosis not present

## 2015-06-21 DIAGNOSIS — N319 Neuromuscular dysfunction of bladder, unspecified: Secondary | ICD-10-CM

## 2015-06-21 DIAGNOSIS — F428 Other obsessive-compulsive disorder: Secondary | ICD-10-CM

## 2015-06-21 DIAGNOSIS — K5909 Other constipation: Secondary | ICD-10-CM

## 2015-06-21 DIAGNOSIS — F329 Major depressive disorder, single episode, unspecified: Secondary | ICD-10-CM

## 2015-06-21 DIAGNOSIS — K5900 Constipation, unspecified: Secondary | ICD-10-CM | POA: Diagnosis not present

## 2015-06-21 DIAGNOSIS — F32A Depression, unspecified: Secondary | ICD-10-CM

## 2015-06-21 MED ORDER — QUETIAPINE FUMARATE 100 MG PO TABS: 100.0000 mg | ORAL_TABLET | Freq: Every day | ORAL | Status: DC

## 2015-06-21 MED ORDER — POLYETHYLENE GLYCOL 3350 17 GM/SCOOP PO POWD: 17.0000 g | Freq: Two times a day (BID) | ORAL | Status: DC

## 2015-06-21 NOTE — Assessment & Plan Note (Signed)
With recurrent suprapubic catheter extrusion through patulous urethra - seen by urology yesterday who changed out large balloon on 24-Fr catheter for smaller balloon on 22-Fr catheter in the hopes that this would be less subject to being pushed down the urethra by bladder spasm.

## 2015-06-21 NOTE — Progress Notes (Signed)
Subjective: [name] [name] is a 39 y.o. female resident of [facility] with a h/o neurogenic bladder due to CP being seen today for ongoing catheter issues.   The suprapubic catheter has continued to extrude through a patulous urethra. She was seen by her [facility]rologist, [facility], 7/[facility] (note from office visit copied below). He changed the 30mL 24-Fr catheter for a smaller 5mL balloon on a 22-Fr catheter hoping this would prevent the bladder spasm from pushing the balloon down the urethra. Recommendations in the event that this was not effective include more drastic measures such as trying to close the urethra or placing a very tight sling. Follow up is scheduled for 8/15 and return for botox injection scheduled 8/26.   She endorses continued but stable leakage around the urostomy site. She also endorses that her OCD is "acting up" since her seroquel was held. She has been constipated, which has been treated with miralax in the past.  ___________________________________________________ Office Visit 06/20/2015 with Dr. Logan BoresEvans: "We noted on exam that she has got a somewhat gaping urethra from previous problems with indwelling Foley and that the catheter was partly coming out through that. We removed the catheter and noted that she was using a large balloon. I think with the big 30 mL balloon the tip of the catheter is being pushed out from the urethra. I replaced it with a 22-French catheter with a 5 mL balloon and only put about 10 mL in. I put this up against the abdominal wall to the careful vaginal wall and noted it really was not pushing up into the bladder. We can actually insert a finger into the urethra and I made sure that there was no evidence of the balloon entering that area.  She felt better. I told her that if this is a persistent problem we may have to try to close the urethra which is actually a very difficult thing to do as it does tend to want to break down.  For now I think we will  continue with the Botox injection, one alternative to closing off the urethra would be due to a very tight sling, which would eliminate some of this extrusion outside patulous bladder neck but would allow us still to have access to give the Botox injections."  Objective: LMP 04/05/2011 Gen: Spastic 39 y.o. female sitting in bed in no distress.   Assessment/Plan: [name] SlickerJudith [name] is a 39 y.o. female being seen to address medications and suprapubic catheter.  See problem list for plan.

## 2015-06-21 NOTE — Assessment & Plan Note (Signed)
Ordered miralax BID.

## 2015-06-21 NOTE — Assessment & Plan Note (Signed)
Rx for seroquel written today.

## 2015-06-22 ENCOUNTER — Encounter: Payer: Self-pay | Admitting: *Deleted

## 2015-06-22 NOTE — Telephone Encounter (Signed)
[name] called again today about "black out spells". She said that she didn't remember much about being in the hospital last week, and continues to have lapses of memory about other things. I attempted to reassure [name] about the episodes and explained that when a person is sick and under stress, that not remembering everything that occurred can happen. I encouraged to her to discuss with Dr Sharene Skeans at her appointment next month if she continues to have problems. TG

## 2015-06-22 NOTE — Telephone Encounter (Signed)
I reviewed your note and agree with your recommendations. 

## 2015-06-28 ENCOUNTER — Ambulatory Visit (INDEPENDENT_AMBULATORY_CARE_PROVIDER_SITE_OTHER): Payer: Medicaid Other | Admitting: Neurology

## 2015-06-28 ENCOUNTER — Encounter: Payer: Self-pay | Admitting: Neurology

## 2015-06-28 ENCOUNTER — Other Ambulatory Visit: Payer: Self-pay | Admitting: Family Medicine

## 2015-06-28 VITALS — BP 112/68 | HR 64

## 2015-06-28 DIAGNOSIS — G809 Cerebral palsy, unspecified: Secondary | ICD-10-CM

## 2015-06-28 DIAGNOSIS — G801 Spastic diplegic cerebral palsy: Secondary | ICD-10-CM

## 2015-06-28 MED ORDER — TIZANIDINE HCL 4 MG PO TABS: 4.0000 mg | ORAL_TABLET | Freq: Two times a day (BID) | ORAL | Status: DC

## 2015-06-28 NOTE — Progress Notes (Signed)
HPI:   [name] [name] is a 39 year old Female with spastic and dystonic quadriparesis, depression/ anxiety, episodes where she loses contact with her world and that have been studied thoroughly and appear nonepileptic in nature, migraine headaches.  She is currently taking coumadin for her previous DVT, recurrent pulmonary emboli.  She came in for EMG guided BOTOX A injection, previously was done by Dr. Kelli Hope since Feb, 2006, received injection at [facility] prior to that.   BOTOX has always been to her right arm, where she often complains of spastic pain, her left arm even though has significant spasticity, is her functional arm.  She used her left arm to manipulate wheelchair, operating cellphone and feed herself.  She also has intrathecal baclofen pump. She used to be able to transfer herself in/out of wheelchair, living in NH now she has gradually worsening functional capacity over the past few month, especially since her recent hospital admission in August 2012. She complained of syncopy, there was associated hypoxia to 83%, CT angiogram of bran and neck were normal. EEG ws normal. abilify and klonopin dosage were decreased  She was discharged to nursing home now, she could no longer transfer herself in and out of wheelchair, she has less functional left arm.  UPDATE June 3rd 2015: Last injection was in Feb 2015, she received injection to her right arm, and left hand which has helped her a lot, there was no significant side effect noticed.  Today she complains of worsening left arm function, difficulty straight out fingers, wants more injection to her left hand, which is her functional hand   She was admitted to hospital in April the fifteenth for abdominal pain, likely related to constipations, CT showed large stool burden, she was treated with Colace, MiraLax, and enema, symptoms has resolved, she is to continue taking Kerin Salen for her pulmonary emboli  UPDATE July 27th 2016: Today she  wants injection to emphasize on her left arm, last injection was in April 2016, she responded well, no significant side effect noticed.   Physical Examinations:  Mental Status: seated in wheelchair, slumped slightly, with head band and chest vest to hold her body posture.  Cranial Nerves: round minimally reactive pupils,  conjugate eye movement, dystonic but symmetric facial strength, difficulty protruding her tongue, severe dysarthria to the point of being in unintelligible at times Motor: right arm in shoulder external rotation, pronation, elbow extended, wrist and finger flexion, maximum 90 with right wrist extension. she has uncontrollable right shoulder posterior extension.  Left arm able to have antigravity movement, but slow movement under voluntary control, stayed in left elbow pronation, flexion. The left hand was fisted. Very slow release of her left finger flexion, with passive movement, she has full range of motion of left shoulder, elbow, and left fingers. She was able to move her legs against gravity 2/5.  Sensory: Withdrawal to pain Coordination:  poor coordination Gait and Station: spastic diplegia, in wheelchair   Assessment and Plan:  39 yo Caucasian female with spastic quadriplegia,came in for botulism toxin injection for bilateral upper extremity spasticity, her left hand is her functional hand, maneuver her wheelchair   Under electronic stimulation, injected 300 units of BOTOX A  Left flexor digitorum profundus 25 units Left flexor digitorum superficialis 25 Left palmaris longus 25 units  Left biceps 25 units Left pronator teres 25 units Left brachialis 25  Right biceps 25x2=50 units Right pronator teres 25 units Right brachialis 25 units Right flexor carpi radialis 25 units Right flexor carpi ulnaris  ulnar is 25 units   She tolerated the injection well, will return to clinic in 3 months for repeat injection  Levert Feinstein, M.D. Ph.D.  [facility] [address] [place], [place] [address] Phone: [phone] Fax:      [phone]

## 2015-06-28 NOTE — Progress Notes (Signed)
**  Botox - [id number], Exp 01/2018**mck,rn 

## 2015-07-03 ENCOUNTER — Encounter: Payer: Self-pay | Admitting: Family Medicine

## 2015-07-03 DIAGNOSIS — N369 Urethral disorder, unspecified: Secondary | ICD-10-CM

## 2015-07-03 HISTORY — DX: Urethral disorder, unspecified: N36.9

## 2015-07-03 NOTE — Progress Notes (Signed)
Patient ID: [name] [name], female   DOB: 1976-11-11, 39 y.o.   MRN: [id number] I have seen and examined this patient. I have reviewed labs and imaging results.  I have discussed with Dr Jarvis Newcomer.  I agree with their findings and plans as documented in their acute visit note.

## 2015-07-03 NOTE — Addendum Note (Signed)
Addended byPerley Jain, TODD D on: 07/03/2015 05:04 PM   Modules accepted: Level of Service

## 2015-07-03 NOTE — Addendum Note (Signed)
Addended byPerley Jain, TODD D on: 07/03/2015 01:35 PM   Modules accepted: Level of Service

## 2015-07-10 DIAGNOSIS — T83011A Breakdown (mechanical) of indwelling urethral catheter, initial encounter: Secondary | ICD-10-CM | POA: Insufficient documentation

## 2015-07-18 ENCOUNTER — Encounter: Payer: Self-pay | Admitting: Pediatrics

## 2015-07-18 ENCOUNTER — Ambulatory Visit (INDEPENDENT_AMBULATORY_CARE_PROVIDER_SITE_OTHER): Payer: Medicaid Other | Admitting: Pediatrics

## 2015-07-18 DIAGNOSIS — R404 Transient alteration of awareness: Secondary | ICD-10-CM

## 2015-07-18 DIAGNOSIS — G249 Dystonia, unspecified: Secondary | ICD-10-CM | POA: Diagnosis not present

## 2015-07-18 DIAGNOSIS — G803 Athetoid cerebral palsy: Secondary | ICD-10-CM | POA: Diagnosis not present

## 2015-07-18 NOTE — Progress Notes (Signed)
Patient: [name] [name] MRN: [id number] Sex: female DOB: [birth year]  Provider: Deetta Perla, MD Location of Care: [facility] Child Neurology  Note type: Routine return visit  History of Present Illness: Referral Source: Dr. Shawna Orleans March History from: patient and [facility] chart Chief Complaint: Baclofen Pump Refill  [name] is a 39 y.o. female who returns for evaluation and management of spastic quadriparesis, and emptying, refilling, and reprogramming her intrathecal baclofen pump.  Procedure: emptying, refilling, and reprogramming her intrathecal baclofen pump.  The device was interrogated and showed a complex continuous infusion of baclofen that has a basal rate of 16.2 mcg per hour. The patient receives bolus doses of 27 mcg delivered at midnight, 6 AM, 12 noon, and 6 PM, for total of 465.8 mcg per day. Her reservoir alarm date is December 28, 2015, or 163 days from now. ERI 64 months.  The patient was sterilely prepped and draped. A 1-1/2 inch 22-gauge noncoring Huebner needle was inserted On the first pass.  The apex of the reservoir is going horizontally to the left. 4.0 mL was withdrawn from the pump placing it under partial vacuum. 40 mL of baclofen (concentration 2000 mcg/mL) was instilled into the pump through a Millipore filter. She tolerated the procedure well.  Review of Systems: 12 system review was unremarkable  Past Medical History Diagnosis Date  . Cerebral palsy   . Pulmonary embolism     Lifetime Coumadin  . Cerebral palsy     Spastic Cerebral palsy, mentally intact  . Contracture, joint, multiple sites     Electric wheelchair, uses left hand to operate chair.   [name] Kitchen Hypertension   . OCD (obsessive compulsive disorder)   . Depression   . Migraine   . [name]   . Dysarthria   . Endometriosis   . History of recurrent UTIs   . GERD (gastroesophageal reflux disease)   . Pulmonary embolism 2011, 01/2011    Will be on  lifetime coumadin  . Anemia   . Abdominal pain 01/02/2012    Overview:  Overview:  Per Previous pcp note- Dr. Ta--Pt has history of endometriosis and had DUB in 2011.  She She had u/s that showed normal endometrial stripe.  She had endometrial bx that was wnl.  She has a lot of abd cramping every month.  This cramping was sometimes not relieved with hydrocodone.  She was referred to [facility] for IUD placement to help endometriosis and abd cramping.  [name]  . Macroscopic hematuria 03/10/2013    status post replacement of suprapubic tube 03/04/2013   . HYPERTENSION, BENIGN 01/31/2010    Qualifier: Diagnosis of  By: [name] Romney, MD, [name] [name] History of endometriosis 10/2012    OBGYN [facility]: Laparoscopic Endometrial Ablation, TVH,  . Gram positive sepsis 10/24/2014  . HCAP (healthcare-associated pneumonia) 10/25/2014  . Abdominal pain 01/02/2012    Overview:  Overview:  Per Previous pcp note- Dr. Ta--Pt has history of endometriosis and had DUB in 2011.  She She had u/s that showed normal endometrial stripe.  She had endometrial bx that was wnl.  She has a lot of abd cramping every month.  This cramping was sometimes not relieved with hydrocodone.  She was referred to [facility] for IUD placement to help endometriosis and abd cramping.  [name] was placed 04/30/11 by [name].  Pt still complains of abd cramping, which I am treating with hydrocodone and ultram. Pt reports some initial relief of pain from IUD, but  states it has now returned back to similar to previous cramping. Gave patient a shot of Toradol 60 mg IM today-since positive exacerbation of abdominal cramping during past 2 weeks. Physical exam reassuring. Patient may need to return to [facility] for further workup since she is plugged in with the GYN providers there for further treatment options regarding endometriosis and abdominal cramping. The dysfunctional uterine bleeding now well contr  . Acute respiratory failure with hypoxia   .  Cellulitis 12/22/2014  . Disease of female genital organs 10/24/2010    Overview:  Overview:  Pt has history of endometriosis and had DUB in 2011.  She had u/s that showed normal endometrial stripe.  She had endometrial bx that was wnl.  She has a lot of abd cramping every month.  This cramping was sometimes not relieved with hydrocodone.  She was referred to [name] for IUD placement to help endometriosis and abd cramping.  [name] was placed 04/30/11 by [name].  Pt still complains of abd cramping, which was treated with hydrocodone and ultram.   Last seen at [facility] by OB/GYN 12/24/11, given Depo Lupron injection.  Ordering CT scan to r/o additional cause of abdominal pain (pt with known endometriosis and will not tolerate vaginal U/S, pain not improved s/p [name] or with Lupron injection-- only caused hot flashes and amenorrhea, no pain relief).  Also made referral to pain clinic for better pain control.  Do not think hysterectomy would help with pain, and concern for contamination of baclofen pump if did proceed with surgery.    8/5/13[name]: Pt was seen by   . Dyspnea and respiratory abnormality 02/04/2011    Overview:  Overview:  Pt has history of recurrent PE and is on chronic coumadin.  Pt has been complaining of dyspnea that she describes as chest tightness.  She has been to the ER multiple times for this.  Usually she gets CTA and serial CE.  She has had at least 6 CTA in a period of since 2011.  She also has an O2 requirement. It is unclear why pt feels dyspneic, complains of chest tightness, or has O2 requirement.  I thought that her complaint may be from deconditioning and referred her to PT.  Unfortunately Medicaid only pays for 4 PT visits.  Pt should be doing these exercises at home, but she has not.  I have referred her to [facility], and she has been seen by [name].  He did not understand the etiology of her dyspnea and O2 requirement.  He will continue to follow her.   Last Assessment  & Plan:  Pt presents with concerns for chest tightness and dyspnea.  She was seen in ED yesterday and discharged home.  While there she had a CT chest 04/20/11 showed 1.  Multifocal degradation as detailed above.  N  . Gross hematuria 03/12/2013    Overview:  Overview:  status post replacement of suprapubic tube 03/04/2013  Last Assessment & Plan:  Recent urine culture collected in ED on 5/4 did not result in significant growth. Moreover, patient has been asymptomatic with normal WBC, no fever and no true symptoms making symptomatic UTI less likely.  Will continue to monitor for any sign of infection.  Patient's xarelto had been held during her recent hospitalization due to hematuria. This has since been re-started. Monitor bleeding.    [name] Kitchen History of anticoagulant therapy 10/21/2012  . Infantile cerebral palsy 01/29/2007    Overview:  She has spastic CP.  She is mentally intact.  She is wheelchair bound.  She is wheelchair bound for ambulation      . Intracervical pessary 05/07/2011    Overview:  Overview:  Placed by [facility] 04/30/11 to treat DUB and Endometriosis.  She is seen at GYN clinic at [facility] but was considered a poor surgical candidate and referred her to [facility].  For DUB she had pelvic ultrasound that showed thin stripe and she had endometrial Bx by Dr Jennette Kettle in GYN clinic, which was negative.     . Parapneumonic effusion 10/25/2014  . Presence of intrathecal baclofen pump 03/07/2014    R LQ. Insertion T10.    [name] Kitchen Psychiatric illness 10/31/2014  . Recurrent pulmonary embolism 05/03/2011    Pt has history of recurrent PE and is on chronic coumadin.  Pt has been complaining of dyspnea that she describes as chest tightness.  She has been to the ER multiple times for this.  Usually she gets CTA and serial CE.  She has had at least 6 CTA in a period of since 2011.  She also has an O2 requirement. It is unclear why pt feels dyspneic, complains of chest tightness, or has O2 requirement.  I thought that her  complaint may be from deconditioning and referred her to PT.  Unfortunately Medicaid only pays for 4 PT visits.  Pt should be doing these exercises at home, but she has not.  I have referred her to [facility], and she has been seen by [name].  He did not understand the etiology of her dyspnea and O2 requirement.  He will continue to follow her.     . Seborrheic keratosis 01/18/2011  . Spasticity 01/05/2014  . Transient alteration of awareness, recurrent 09/08/2006    Recurrent episodes where Phynix  loses contact with her world and that have been studied thoroughly and appear nonepileptic in nature per Dr Terrace Arabia (neurology) The patient has had episodes starting in 2007 that initially began 1-4 times a day during which time she would have periods of unawareness followed by confusion. This continuous video EEG monitoring performed from October 8-12, 2007 failed to show seizures.  . Acute cystitis without hematuria   . Acute cystitis with hematuria   . Urethra dilated and patulous 07/03/2015    Patulous, Dilated Urethra. 5mL balloon on a 22-Fr catheter hoping this would prevent the bladder spasm from pushing the balloon down the urethra (Dr Logan Bores, Urology [facility], July 2016)    Hospitalizations: No., Head Injury: No., Nervous System Infections: No., Immunizations up to date: Yes.    Birth History Extremely premature infant  Behavior History anxiety, depression, nonepileptic loss of consciousness  Surgical History Procedure Laterality Date  . Colposcopy  06/2000  . Cesarean section      x 2  . Tubal ligation  2003  . Carpal tunnel release  08/2008    Dr Teressa Senter  . Wrist surgery  06/2010    Dr Dierdre Searles, hand surgeon, [name]  . Baclofen pump refill      x 3 times  . Appendectomy    . Intrauterine device insertion  04/30/11    Inserted by [facility] for endometriosis  . Laparoscopically assisted vag hysterectomy  10/27/2012  . Multiple extractions with alveoloplasty Bilateral 07/19/2013    Procedure:  EXTRACTIONS #4, 8,41,66,06;  Surgeon: Francene Finders, DDS;  Location: [facility] OR;  Service: Oral Surgery;  Laterality: Bilateral;  . Pain pump implantation N/A 03/07/2014    Procedure: baclofen pump  revision/replacement and Catheter connection replacement;  Surgeon: Cristi Loron, MD;  Location: [facility];  Service: Neurosurgery;  Laterality: N/A;  baclofen pump revision/replacement and Catheter connection replacement  . Programable baclofen pump revision  03/17/14    Battery Replacement    Family History family history includes Alzheimer's disease in her paternal grandfather; Asthma in her father; Breast cancer in her paternal grandmother; Cancer in her paternal grandmother; Colon cancer in her maternal grandmother; Heart attack in her maternal grandfather. Family history is negative for migraines, seizures, intellectual disabilities, blindness, deafness, birth defects, chromosomal disorder, or autism.  Social History . Marital Status: Divorced    Spouse Name: N/A  . Number of Children: 2  . Years of Education: college   Occupational History  .    [name] Kitchen UNEMPLOYED    Social History Main Topics  . Smoking status: Never Smoker   . Smokeless tobacco: Never Used  . Alcohol Use: 0.6 oz/week    1 Glasses of wine per week     Comment: Drinks alcohol once a month.  . Drug Use: No  . Sexual Activity: No   Social History Narrative   Ailene Ards, who has schizophrenia, MR, is father of daughter.  Homero Fellers and pt are no longer together.  Homero Fellers does not see [name]. Daughter is 54 yo, Stage manager, lives with pt.        [name] [phone]), lives with pt's parents.      Needs assistance with ADL's and IADL's--has personal care services 20 hrs/wk;       No tobacco, EtOH, drugs.      **Patient very concerned about her kids being taken from her.      **Case Manager: Jack Quarto [phone]   Educational level junior college   Living at [employer]: Enjoys  reading and being on [employer].   School comments [name] graduated with a degree in Criminal Justice in 2004 at [employer].   Allergies Allergen Reactions  . Morphine Dermatitis    Skin turned red   Physical Exam Not examined today  Assessment 1.  Athetoid cervical palsy, G80.3. 2.  Dystonia, G24.9. 3.  Transient alteration of awareness, recurrent, R40.4  Plan Successfully emptied refilled and reprogrammed intrathecal baclofen pump.  She will return in December 28, 2015 empty refill and reprogram her pump.   Medication List   This list is accurate as of: 07/18/15 11:08 AM.       ABILIFY PO  Take 1 tablet by mouth at bedtime. 1mg  tablet per [facility]     acetaminophen 325 MG tablet  Commonly known as:  TYLENOL  Take 650 mg by mouth every 8 (eight) hours as needed for mild pain.     bacitracin-polymyxin b ointment  Commonly known as:  POLYSPORIN  Apply 1 g topically 2 (two) times daily. Apply around Suprapubic ostomy site     baclofen 10 MG tablet  Commonly known as:  LIORESAL  Take 20 mg by mouth 4 (four) times daily.     BOTOX IJ  Inject 300 Units as directed every 3 (three) months.     clonazePAM 1 MG tablet  Commonly known as:  KLONOPIN  Take 1 tablet (1 mg total) by mouth 4 (four) times daily.     flavoxATE 100 MG tablet  Commonly known as:  URISPAS  Take 100 mg by mouth 3 (three) times daily as needed for bladder spasms.     FLUoxetine 10 MG capsule  Commonly known  as:  PROZAC  Take 10 mg by mouth daily.     HYDROcodone-acetaminophen 5-325 MG per tablet  Commonly known as:  NORCO/VICODIN  Take 1 tablet by mouth every 6 (six) hours as needed (pain in both knees).     ibuprofen 200 MG tablet  Commonly known as:  ADVIL,MOTRIN  Take 400 mg by mouth every 8 (eight) hours as needed for mild pain.     IMITREX 100 MG tablet  Generic drug:  SUMAtriptan  Take 100 mg by mouth every 2 (two) hours as needed for migraine or headache. May repeat in 2 hours if headache persists or  recurs.     multivitamin tablet  Take 1 tablet by mouth daily.     polyethylene glycol powder powder  Commonly known as:  GLYCOLAX/MIRALAX  Take 17 g by mouth 2 (two) times daily.     QUEtiapine 100 MG tablet  Commonly known as:  SEROQUEL  Take 1 tablet (100 mg total) by mouth at bedtime.     rivaroxaban 20 MG Tabs tablet  Commonly known as:  XARELTO  Take 20 mg by mouth daily with supper.     senna-docusate 8.6-50 MG per tablet  Commonly known as:  Senokot-S  Take 1 tablet by mouth 2 (two) times daily.     tiZANidine 4 MG tablet  Commonly known as:  ZANAFLEX  Take 1 tablet (4 mg total) by mouth 2 (two) times daily. Admin at 04:30 and qHS      The medication list was reviewed and reconciled. All changes or newly prescribed medications were explained.  A complete medication list was provided to the patient/caregiver.  Deetta Perla MD

## 2015-07-25 DIAGNOSIS — A419 Sepsis, unspecified organism: Secondary | ICD-10-CM | POA: Diagnosis present

## 2015-07-25 DIAGNOSIS — R6520 Severe sepsis without septic shock: Secondary | ICD-10-CM

## 2015-07-25 DIAGNOSIS — N10 Acute tubulo-interstitial nephritis: Secondary | ICD-10-CM | POA: Insufficient documentation

## 2015-08-08 ENCOUNTER — Encounter: Payer: Self-pay | Admitting: Pharmacist

## 2015-08-18 ENCOUNTER — Non-Acute Institutional Stay: Payer: Medicaid Other | Admitting: Obstetrics and Gynecology

## 2015-08-18 DIAGNOSIS — G809 Cerebral palsy, unspecified: Secondary | ICD-10-CM | POA: Diagnosis not present

## 2015-08-18 DIAGNOSIS — I10 Essential (primary) hypertension: Secondary | ICD-10-CM

## 2015-08-18 DIAGNOSIS — T83018D Breakdown (mechanical) of other indwelling urethral catheter, subsequent encounter: Secondary | ICD-10-CM

## 2015-08-18 DIAGNOSIS — G801 Spastic diplegic cerebral palsy: Secondary | ICD-10-CM

## 2015-08-18 DIAGNOSIS — Z593 Problems related to living in residential institution: Secondary | ICD-10-CM

## 2015-08-18 DIAGNOSIS — N319 Neuromuscular dysfunction of bladder, unspecified: Secondary | ICD-10-CM

## 2015-08-18 DIAGNOSIS — T83011D Breakdown (mechanical) of indwelling urethral catheter, subsequent encounter: Secondary | ICD-10-CM

## 2015-08-18 NOTE — Progress Notes (Signed)
[facility]  Visit  Primary Care [name] [name]: [name] Ada, DO Location of Care: [facility] Visit Information: a scheduled routine follow-up visit Source(s) of information for visit: patient, nursing home and medical records  Chief Complaint: Catheter leaking  Nursing Concerns: None   Nutrition Concerns: None   HISTORY OF PRESENT ILLNESS:  #Suprapubic catheter: Patient states that she does not believe her suprapubic catheter is in the right location anymore. She states that she has constant leaking on urine around catheter site. States that it is going to get it fixed by urology next Friday at [facility]. She has no complaints of pain or infection. No feeling of fevers. Patient nervous about possible complications from procedure.  #Wheelchair: Patient trying to raise money for a new wheelchair. She states that current wheelchair is old and is starting to cause her problems. She believes it is worsening her CP. She states that insurance will not pay for new chair.    Outpatient Encounter Prescriptions as of 08/18/2015  Medication Sig  . acetaminophen (TYLENOL) 325 MG tablet Take 650 mg by mouth every 8 (eight) hours as needed for mild pain.   . ARIPiprazole (ABILIFY PO) Take 1 tablet by mouth at bedtime. 1mg  tablet per MAR  . bacitracin-polymyxin b (POLYSPORIN) ointment Apply 1 g topically 2 (two) times daily. Apply around Suprapubic ostomy site  . baclofen (LIORESAL) 10 MG tablet Take 20 mg by mouth 4 (four) times daily.   . Botulinum Toxin Type A 200 UNITS SOLR   . clonazePAM (KLONOPIN) 1 MG tablet Take 1 tablet (1 mg total) by mouth 4 (four) times daily.  . flavoxATE (URISPAS) 100 MG tablet Take 100 mg by mouth 3 (three) times daily as needed for bladder spasms.  [name] Kitchen FLUoxetine (PROZAC) 10 MG capsule Take 10 mg by mouth daily.  [name] Kitchen HYDROcodone-acetaminophen (NORCO/VICODIN) 5-325 MG per tablet Take 1 tablet by mouth every 6 (six) hours as needed (pain in both knees).   [name] Kitchen ibuprofen (ADVIL,MOTRIN) 200 MG tablet Take 400 mg by mouth every 8 (eight) hours as needed for mild pain.   . Multiple Vitamin (MULTIVITAMIN) tablet Take 1 tablet by mouth daily.  . OnabotulinumtoxinA (BOTOX IJ) Inject 300 Units as directed every 3 (three) months.  . polyethylene glycol powder (GLYCOLAX/MIRALAX) powder Take 17 g by mouth 2 (two) times daily.  . QUEtiapine (SEROQUEL) 100 MG tablet Take 1 tablet (100 mg total) by mouth at bedtime.  . rivaroxaban (XARELTO) 20 MG TABS tablet Take 20 mg by mouth daily with supper.  . senna-docusate (SENOKOT-S) 8.6-50 MG per tablet Take 1 tablet by mouth 2 (two) times daily.  . SUMAtriptan (IMITREX) 100 MG tablet Take 100 mg by mouth every 2 (two) hours as needed for migraine or headache. May repeat in 2 hours if headache persists or recurs.  [name] Kitchen tiZANidine (ZANAFLEX) 4 MG tablet Take 1 tablet (4 mg total) by mouth 2 (two) times daily. Admin at 04:30 and qHS   No facility-administered encounter medications on file as of 08/18/2015.   Allergies  Allergen Reactions  . Morphine Dermatitis    Skin turned red   History Patient Active Problem List   Diagnosis Date Noted  . Acute pyelonephritis   . Severe sepsis   . Malfunction of Foley catheter   . Urethra dilated and patulous 07/03/2015  . Person living in residential institution 06/14/2015  . Urinary tract infectious disease   . Cerebral palsy   . Abdominal pain   . Septic shock   .  Pulmonary hypertension   . Anemia of chronic disease   . Sepsis 06/09/2015  . Suprapubic catheter 05/16/2015  . Spasticity 03/15/2015  . Irritant injury of right ear 01/11/2015  . Constipation, chronic 10/10/2014  . Athetoid cerebral palsy 04/13/2014  . Dystonia 04/12/2014  . Presence of intrathecal baclofen pump 03/07/2014  . Nursing home resident 09/16/2013  . Dental caries 07/18/2013  . Multiple thyroid nodules 05/20/2013  . Osteoarthrosis 04/07/2013  . [name]   .  Dysarthria   . GERD (gastroesophageal reflux disease)   . Neurogenic bladder 02/05/2013  . Anxiety state 10/21/2012  . Healed or old pulmonary embolism 10/21/2012  . Chronic pain syndrome 06/17/2012  . Recurrent pulmonary embolism 05/03/2011  . Chronic suprapubic catheter 03/15/2011  . Long term (current) use of anticoagulants 01/12/2011  . Disease of female genital organs 10/24/2010  . HYPERTENSION, BENIGN 01/31/2010  . CP (cerebral palsy), spastic 12/27/2008  . Contracted, joint, multiple sites 12/27/2008  . Major depressive disorder, recurrent episode 01/29/2007  . Obsessive-compulsive disorder 01/29/2007  . Obsessive Compulsive Disorder 01/29/2007  . Transient alteration of awareness, recurrent 09/08/2006   Past Medical History  Diagnosis Date  . Cerebral palsy   . Pulmonary embolism     Lifetime Coumadin  . Cerebral palsy     Spastic Cerebral palsy, mentally intact  . Contracture, joint, multiple sites     Electric wheelchair, uses left hand to operate chair.   [name] Kitchen Hypertension   . OCD (obsessive compulsive disorder)   . Depression   . Migraine   . [name]   . Dysarthria   . Endometriosis   . History of recurrent UTIs   . GERD (gastroesophageal reflux disease)   . Pulmonary embolism 2011, 01/2011    Will be on lifetime coumadin  . Anemia   . Abdominal pain 01/02/2012    Overview:  Overview:  Per Previous pcp note- Dr. Ta--Pt has history of endometriosis and had DUB in 2011.  She She had u/s that showed normal endometrial stripe.  She had endometrial bx that was wnl.  She has a lot of abd cramping every month.  This cramping was sometimes not relieved with hydrocodone.  She was referred to [facility] for IUD placement to help endometriosis and abd cramping.  [name]  . Macroscopic hematuria 03/10/2013    status post replacement of suprapubic tube 03/04/2013   . HYPERTENSION, BENIGN 01/31/2010    Qualifier: Diagnosis of  By: [name], MD, [name] History of endometriosis 10/2012    OBGYN [facility]: Laparoscopic Endometrial Ablation, TVH,  . Gram positive sepsis 10/24/2014  . HCAP (healthcare-associated pneumonia) 10/25/2014  . Abdominal pain 01/02/2012    Overview:  Overview:  Per Previous pcp note- Dr. Ta--Pt has history of endometriosis and had DUB in 2011.  She She had u/s that showed normal endometrial stripe.  She had endometrial bx that was wnl.  She has a lot of abd cramping every month.  This cramping was sometimes not relieved with hydrocodone.  She was referred to [facility] for IUD placement to help endometriosis and abd cramping.  [name] was placed 04/30/11 by [facility].  Pt still complains of abd cramping, which I am treating with hydrocodone and ultram. Pt reports some initial relief of pain from IUD, but states it has now returned back to similar to previous cramping. Gave patient a shot of Toradol 60 mg IM today-since positive exacerbation of abdominal cramping during past 2  weeks. Physical exam reassuring. Patient may need to return [facility] workup since she is plugged in with the GYN providers there for further treatment options regarding endometriosis and abdominal cramping. The dysfunctional uterine bleeding now well contr  . Acute respiratory failure with hypoxia   . Cellulitis 12/22/2014  . Disease of female genital organs 10/24/2010    Overview:  Overview:  Pt has history of endometriosis and had DUB in 2011.  She had u/s that showed normal endometrial stripe.  She had endometrial bx that was wnl.  She has a lot of abd cramping every month.  This cramping was sometimes not relieved with hydrocodone.  She was referred to [facility] for IUD placement to help endometriosis and abd cramping.  [name] was placed 04/30/11 by [facility].  Pt still complains of abd cramping, which was treated with hydrocodone and ultram.   Last seen at [facility] by OB/GYN 12/24/11, given Depo Lupron injection.  Ordering CT scan to r/o additional  cause of abdominal pain (pt with known endometriosis and will not tolerate vaginal U/S, pain not improved s/p [name] or with Lupron injection-- only caused hot flashes and amenorrhea, no pain relief).  Also made referral to pain clinic for better pain control.  Do not think hysterectomy would help with pain, and concern for contamination of baclofen pump if did proceed with surgery.    8/5/13[name]: Pt was seen by   . Dyspnea and respiratory abnormality 02/04/2011    Overview:  Overview:  Pt has history of recurrent PE and is on chronic coumadin.  Pt has been complaining of dyspnea that she describes as chest tightness.  She has been to the ER multiple times for this.  Usually she gets CTA and serial CE.  She has had at least 6 CTA in a period of since 2011.  She also has an O2 requirement. It is unclear why pt feels dyspneic, complains of chest tightness, or has O2 requirement.  I thought that her complaint may be from deconditioning and referred her to PT.  Unfortunately Medicaid only pays for 4 PT visits.  Pt should be doing these exercises at home, but she has not.  I have referred her to [facility], and she has been seen by [name].  He did not understand the etiology of her dyspnea and O2 requirement.  He will continue to follow her.   Last Assessment & Plan:  Pt presents with concerns for chest tightness and dyspnea.  She was seen in ED yesterday and discharged home.  While there she had a CT chest 04/20/11 showed 1.  Multifocal degradation as detailed above.  N  . Gross hematuria 03/12/2013    Overview:  Overview:  status post replacement of suprapubic tube 03/04/2013  Last Assessment & Plan:  Recent urine culture collected in ED on 5/4 did not result in significant growth. Moreover, patient has been asymptomatic with normal WBC, no fever and no true symptoms making symptomatic UTI less likely.  Will continue to monitor for any sign of infection.  Patient's xarelto had been held during her recent  hospitalization due to hematuria. This has since been re-started. Monitor bleeding.    [name] Kitchen History of anticoagulant therapy 10/21/2012  . Infantile cerebral palsy 01/29/2007    Overview:  She has spastic CP.  She is mentally intact.  She is wheelchair bound.  She is wheelchair bound for ambulation      . Intracervical pessary 05/07/2011    Overview:  Overview:  Placed by [facility] 04/30/11 to treat DUB and Endometriosis.  She is seen at GYN clinic at [facility] but was considered a poor surgical candidate and referred her to [facility].  For DUB she had pelvic ultrasound that showed thin stripe and she had endometrial Bx by Dr Jennette Kettle in GYN clinic, which was negative.     . Parapneumonic effusion 10/25/2014  . Presence of intrathecal baclofen pump 03/07/2014    R LQ. Insertion T10.    [name] Kitchen Psychiatric illness 10/31/2014  . Recurrent pulmonary embolism 05/03/2011    Pt has history of recurrent PE and is on chronic coumadin.  Pt has been complaining of dyspnea that she describes as chest tightness.  She has been to the ER multiple times for this.  Usually she gets CTA and serial CE.  She has had at least 6 CTA in a period of since 2011.  She also has an O2 requirement. It is unclear why pt feels dyspneic, complains of chest tightness, or has O2 requirement.  I thought that her complaint may be from deconditioning and referred her to PT.  Unfortunately Medicaid only pays for 4 PT visits.  Pt should be doing these exercises at home, but she has not.  I have referred her to [facility], and she has been seen by [name].  He did not understand the etiology of her dyspnea and O2 requirement.  He will continue to follow her.     . Seborrheic keratosis 01/18/2011  . Spasticity 01/05/2014  . Transient alteration of awareness, recurrent 09/08/2006    Recurrent episodes where Alaynna  loses contact with her world and that have been studied thoroughly and appear nonepileptic in nature per Dr Terrace Arabia (neurology) The patient has had episodes  starting in 2007 that initially began 1-4 times a day during which time she would have periods of unawareness followed by confusion. This continuous video EEG monitoring performed from October 8-12, 2007. Had   . Acute cystitis without hematuria   . Acute cystitis with hematuria   . Urethra dilated and patulous 07/03/2015    Patulous, Dilated Urethra. 5mL balloon on a 22-Fr catheter hoping this would prevent the bladder spasm from pushing the balloon down the urethra (Dr Logan Bores, Urology [facility], July 2016)    Past Surgical History  Procedure Laterality Date  . Colposcopy  06/2000  . Cesarean section      x 2  . Tubal ligation  2003  . Carpal tunnel release  08/2008    Dr Teressa Senter  . Wrist surgery  06/2010    Dr Dierdre Searles, hand surgeon, [name]  . Baclofen pump refill      x 3 times  . Appendectomy    . Intrauterine device insertion  04/30/11    Inserted by [facility] GYN for endometriosis  . Laparoscopically assisted vag hysterectomy  10/27/2012  . Multiple extractions with alveoloplasty Bilateral 07/19/2013    Procedure: EXTRACTIONS #4, 1,61,09,60;  Surgeon: Francene Finders, DDS;  Location: [facility] OR;  Service: Oral Surgery;  Laterality: Bilateral;  . Pain pump implantation N/A 03/07/2014    Procedure: baclofen pump revision/replacement and Catheter connection replacement;  Surgeon: Cristi Loron, MD;  Location: [facility] OR[facility];  Service: Neurosurgery;  Laterality: N/A;  baclofen pump revision/replacement and Catheter connection replacement  . Programable baclofen pump revision  03/17/14    Battery Replacement    Family History  Problem Relation Age of Onset  . Asthma Father   . Colon cancer  Maternal Grandmother     Died in her 62's  . Cancer Paternal Grandmother   . Breast cancer Paternal Grandmother     Died in her 19's  . Heart attack Maternal Grandfather     Died in his 49's  . Alzheimer's disease Paternal Grandfather     Died in his 30's    reports that she has never smoked. She has  never used smokeless tobacco. She reports that she drinks about 0.6 oz of alcohol per week. She reports that she does not use illicit drugs.   Review of Systems  Patient has ability to communicate answers to ROS: yes See HPI  PHYSICAL EXAM:. Wt Readings from Last 3 Encounters:  06/14/15 135 lb 14.4 oz (61.643 kg)  05/05/15 134 lb (60.782 kg)  10/22/14 135 lb 14.4 oz (61.644 kg)   Temp Readings from Last 3 Encounters:  06/14/15 98.9 F (37.2 C) Oral  04/28/15 98.3 F (36.8 C)   05/02/15 97.8 F (36.6 C)    BP Readings from Last 3 Encounters:  06/28/15 112/68  06/14/15 118/70  04/28/15 115/64   Pulse Readings from Last 3 Encounters:  06/28/15 64  06/14/15 81  04/28/15 72   General: alert, cooperative, well nourished, pleasant, clean, groomed HEENT:  No scleral icterus, no nasal secretions, [place] moist and no erythema or lesion CV:  RRR, no murmur RESP: No resp distress or accessory muscle use.  Clear to ausc bilat.  ABD:  Soft, Non-tender, non-distended, +bowel sounds, no masses. Urostomy site draining. No skin breakdown or signs of infection such as warmth or erythema. Inrtathecal Baclofen pump palpable on R abdomen.  MSK:  Spasm and contractures EXT: Warm and well perfused,  no edema, no erythema, pulses WNL Gait:  Non ambulatory Skin: No rashes or wounds appreciated.  Neurologic: A&Ox3, Spastic extremities, dysarthric Psych: Judgment Good, Insight Intact; Attention Normal;  Mood appropriate; Speech slurred and at baseline   Assessment and Plan:   See Problem List for individual problem's assessment and plans.    [name] Ada, DO 08/18/2015, 4:31 PM PGY-2, [facility]

## 2015-08-19 NOTE — Assessment & Plan Note (Signed)
Stable, chronic condition. Patient without complaints of pain. Continue pain regimen. Recently seen by Dr. Sharene Skeans neurologist who refilled Baclofen pump. Will look into possibility of newer wheelchair for patient as she feels lik her chair now is outdated and worsening her CP.

## 2015-08-19 NOTE — Assessment & Plan Note (Signed)
BPs appear stable after reviewing vitals. Patient not currently on medications. Continue to monitor.

## 2015-08-19 NOTE — Assessment & Plan Note (Signed)
Followed by urology and has suprapubic catheter. Recently discharged from hospital with sepsis 2/2 UTI. Has been having issues with catheter dislodging and leakage around site. Patient to have it fixed next Friday(08/25/2015) at [facility]. No current signs or symptoms of infection. Monitor closely as site at increased risk of infection and skin breakdown with constant draining.

## 2015-08-25 DIAGNOSIS — R32 Unspecified urinary incontinence: Secondary | ICD-10-CM | POA: Insufficient documentation

## 2015-08-28 ENCOUNTER — Encounter: Payer: Self-pay | Admitting: Obstetrics and Gynecology

## 2015-08-28 NOTE — Progress Notes (Signed)
Patient ID: [name] [name], female   DOB: 1976/06/17, 39 y.o.   MRN: [id number] I discussed the patient's case with  Dr. Doroteo Glassman. I have reviewed labs, imaging and findings with them.  I agree with their plans documented in their regulatory visit note.

## 2015-08-30 ENCOUNTER — Non-Acute Institutional Stay (INDEPENDENT_AMBULATORY_CARE_PROVIDER_SITE_OTHER): Payer: Medicaid Other | Admitting: Family Medicine

## 2015-08-30 DIAGNOSIS — I10 Essential (primary) hypertension: Secondary | ICD-10-CM

## 2015-08-30 DIAGNOSIS — G809 Cerebral palsy, unspecified: Secondary | ICD-10-CM

## 2015-08-30 DIAGNOSIS — Z593 Problems related to living in residential institution: Secondary | ICD-10-CM

## 2015-08-30 DIAGNOSIS — G801 Spastic diplegic cerebral palsy: Secondary | ICD-10-CM

## 2015-08-30 NOTE — Progress Notes (Signed)
Nursing Home Admission History and Physical  Patient name: [name] [name] Medical record number: [id number] Date of birth: 1976/10/08 Age: 39 y.o. Gender: female  Primary Care Provider: Caryl Ada, DO Code Status: Full Code  Chief Complaint: Readmission to Nursing Home  Assessment and Plan: [name] [name] is a 39 y.o. female presenting back to [facility] nursing home s/p admission  Neurogenic bladder with suprapubic catheter s/p bladder neck closure - Continue Bactrim BID x2 weeks - Will need to confirm follow-up with Urology  Possible vesicovaginal fistula: patient with "funny feeling" when foley bag is manipulated. CT scan shows air in vagina consistent with possible fistula.  - trying to coordinate with [facility]rology regarding workup/management of this possibility  Hypertension - continue to monitor vitals routinely  Cerebral palsy, spastic - Discontinue Oxycodone  - Restart hydrocodone-acetaminophen q6hrs PRN pain   History of Present Illness:  [name] [name] is a 39 y.o. female presenting back to [facility] after admission to ICU s/p bladder neck surgery for hypotension. She required pressors. No source of infection was found. Blood and urine cultures negative. Patient was weaned off of pressors and maintained blood pressure. She was deemed stable for transfer back to nursing home. She reports having a funny feeling in her vagina when her foley bag is manipulated. No pain. No leaking urine.  Review Of Systems: Per HPI with the following additions: None Otherwise 12 point review of systems was performed and was unremarkable.  Patient Active Problem List   Diagnosis Date Noted  . Malfunction of Foley catheter   . Urethra dilated and patulous 07/03/2015  . Person living in residential institution 06/14/2015  . Urinary tract infectious disease   . Cerebral palsy   . Abdominal pain   . Pulmonary hypertension   . Anemia of chronic disease   . Suprapubic catheter  05/16/2015  . Spasticity 03/15/2015  . Irritant injury of right ear 01/11/2015  . Constipation, chronic 10/10/2014  . Athetoid cerebral palsy 04/13/2014  . Dystonia 04/12/2014  . Presence of intrathecal baclofen pump 03/07/2014  . Nursing home resident 09/16/2013  . Dental caries 07/18/2013  . Multiple thyroid nodules 05/20/2013  . Osteoarthrosis 04/07/2013  . [name]   . Dysarthria   . GERD (gastroesophageal reflux disease)   . Neurogenic bladder 02/05/2013  . Anxiety state 10/21/2012  . Healed or old pulmonary embolism 10/21/2012  . Chronic pain syndrome 06/17/2012  . Recurrent pulmonary embolism 05/03/2011  . Chronic suprapubic catheter 03/15/2011  . Long term (current) use of anticoagulants 01/12/2011  . Disease of female genital organs 10/24/2010  . HYPERTENSION, BENIGN 01/31/2010  . CP (cerebral palsy), spastic 12/27/2008  . Contracted, joint, multiple sites 12/27/2008  . Major depressive disorder, recurrent episode 01/29/2007  . Obsessive Compulsive Disorder 01/29/2007  . Transient alteration of awareness, recurrent 09/08/2006   Past Medical History: Past Medical History  Diagnosis Date  . Cerebral palsy   . Pulmonary embolism     Lifetime Coumadin  . Cerebral palsy     Spastic Cerebral palsy, mentally intact  . Contracture, joint, multiple sites     Electric wheelchair, uses left hand to operate chair.   [name] Kitchen Hypertension   . OCD (obsessive compulsive disorder)   . Depression   . Migraine   . [name]   . Dysarthria   . Endometriosis   . History of recurrent UTIs   . GERD (gastroesophageal reflux disease)   . Pulmonary embolism 2011, 01/2011    Will  be on lifetime coumadin  . Anemia   . Abdominal pain 01/02/2012    Overview:  Overview:  Per Previous pcp note- Dr. Ta--Pt has history of endometriosis and had DUB in 2011.  She She had u/s that showed normal endometrial stripe.  She had endometrial bx that was wnl.   She has a lot of abd cramping every month.  This cramping was sometimes not relieved with hydrocodone.  She was referred to [facility] for IUD placement to help endometriosis and abd cramping.  [name]  . Macroscopic hematuria 03/10/2013    status post replacement of suprapubic tube 03/04/2013   . HYPERTENSION, BENIGN 01/31/2010    Qualifier: Diagnosis of  By: [name], MD, [name] History of endometriosis 10/2012    OBGYN [facility]: Laparoscopic Endometrial Ablation, TVH,  . Gram positive sepsis 10/24/2014  . HCAP (healthcare-associated pneumonia) 10/25/2014  . Abdominal pain 01/02/2012    Overview:  Overview:  Per Previous pcp note- Dr. Ta--Pt has history of endometriosis and had DUB in 2011.  She She had u/s that showed normal endometrial stripe.  She had endometrial bx that was wnl.  She has a lot of abd cramping every month.  This cramping was sometimes not relieved with hydrocodone.  She was referred to [facility] for IUD placement to help endometriosis and abd cramping.  [name] was placed 04/30/11 by [facility].  Pt still complains of abd cramping, which I am treating with hydrocodone and ultram. Pt reports some initial relief of pain from IUD, but states it has now returned back to similar to previous cramping. Gave patient a shot of Toradol 60 mg IM today-since positive exacerbation of abdominal cramping during past 2 weeks. Physical exam reassuring. Patient may need to return to [facility] for further workup since she is plugged in with the GYN providers there for further treatment options regarding endometriosis and abdominal cramping. The dysfunctional uterine bleeding now well contr  . Acute respiratory failure with hypoxia   . Cellulitis 12/22/2014  . Disease of female genital organs 10/24/2010    Overview:  Overview:  Pt has history of endometriosis and had DUB in 2011.  She had u/s that showed normal endometrial stripe.  She had endometrial bx that was wnl.  She has a lot of abd cramping  every month.  This cramping was sometimes not relieved with hydrocodone.  She was referred to [facility] for IUD placement to help endometriosis and abd cramping.  [name] was placed 04/30/11 by [facility].  Pt still complains of abd cramping, which was treated with hydrocodone and ultram.   Last seen at [facility] by OB/GYN 12/24/11, given Depo Lupron injection.  Ordering CT scan to r/o additional cause of abdominal pain (pt with known endometriosis and will not tolerate vaginal U/S, pain not improved s/p [name] or with Lupron injection-- only caused hot flashes and amenorrhea, no pain relief).  Also made referral to pain clinic for better pain control.  Do not think hysterectomy would help with pain, and concern for contamination of baclofen pump if did proceed with surgery.    8/5/13[name]: Pt was seen by   . Dyspnea and respiratory abnormality 02/04/2011    Overview:  Overview:  Pt has history of recurrent PE and is on chronic coumadin.  Pt has been complaining of dyspnea that she describes as chest tightness.  She has been to the ER multiple times for this.  Usually she gets CTA and serial CE.  She  has had at least 6 CTA in a period of since 2011.  She also has an O2 requirement. It is unclear why pt feels dyspneic, complains of chest tightness, or has O2 requirement.  I thought that her complaint may be from deconditioning and referred her to PT.  Unfortunately Medicaid only pays for 4 PT visits.  Pt should be doing these exercises at home, but she has not.  I have referred her to [facility], and she has been seen by [name].  He did not understand the etiology of her dyspnea and O2 requirement.  He will continue to follow her.   Last Assessment & Plan:  Pt presents with concerns for chest tightness and dyspnea.  She was seen in ED yesterday and discharged home.  While there she had a CT chest 04/20/11 showed 1.  Multifocal degradation as detailed above.  N  . Gross hematuria 03/12/2013    Overview:   Overview:  status post replacement of suprapubic tube 03/04/2013  Last Assessment & Plan:  Recent urine culture collected in ED on 5/4 did not result in significant growth. Moreover, patient has been asymptomatic with normal WBC, no fever and no true symptoms making symptomatic UTI less likely.  Will continue to monitor for any sign of infection.  Patient's xarelto had been held during her recent hospitalization due to hematuria. This has since been re-started. Monitor bleeding.    [name] Kitchen History of anticoagulant therapy 10/21/2012  . Infantile cerebral palsy 01/29/2007    Overview:  She has spastic CP.  She is mentally intact.  She is wheelchair bound.  She is wheelchair bound for ambulation      . Intracervical pessary 05/07/2011    Overview:  Overview:  Placed by [facility]YN 04/30/11 to treat DUB and Endometriosis.  She is seen at GYN clinic at [facility] but was considered a poor surgical candidate and referred her to [facility].  For DUB she had pelvic ultrasound that showed thin stripe and she had endometrial Bx by Dr Jennette Kettle in GYN clinic, which was negative.     . Parapneumonic effusion 10/25/2014  . Presence of intrathecal baclofen pump 03/07/2014    R LQ. Insertion T10.    [name] Kitchen Psychiatric illness 10/31/2014  . Recurrent pulmonary embolism 05/03/2011    Pt has history of recurrent PE and is on chronic coumadin.  Pt has been complaining of dyspnea that she describes as chest tightness.  She has been to the ER multiple times for this.  Usually she gets CTA and serial CE.  She has had at least 6 CTA in a period of since 2011.  She also has an O2 requirement. It is unclear why pt feels dyspneic, complains of chest tightness, or has O2 requirement.  I thought that her complaint may be from deconditioning and referred her to PT.  Unfortunately Medicaid only pays for 4 PT visits.  Pt should be doing these exercises at home, but she has not.  I have referred her to [facility], and she has been seen by [name].  He did not  understand the etiology of her dyspnea and O2 requirement.  He will continue to follow her.     . Seborrheic keratosis 01/18/2011  . Spasticity 01/05/2014  . Transient alteration of awareness, recurrent 09/08/2006    Recurrent episodes where Taia  loses contact with her world and that have been studied thoroughly and appear nonepileptic in nature per Dr Terrace Arabia (neurology) The patient has had episodes starting  in 2007 that initially began 1-4 times a day during which time she would have periods of unawareness followed by confusion. This continuous video EEG monitoring performed from October 8-12, 2007. Had   . Acute cystitis without hematuria   . Acute cystitis with hematuria   . Urethra dilated and patulous 07/03/2015    Patulous, Dilated Urethra. 5mL balloon on a 22-Fr catheter hoping this would prevent the bladder spasm from pushing the balloon down the urethra (Dr Logan Bores, Urology [facility], July 2016)    Past Surgical History: Past Surgical History  Procedure Laterality Date  . Colposcopy  06/2000  . Cesarean section      x 2  . Tubal ligation  2003  . Carpal tunnel release  08/2008    Dr Teressa Senter  . Wrist surgery  06/2010    Dr Dierdre Searles, hand surgeon, [name]  . Baclofen pump refill      x 3 times  . Appendectomy    . Intrauterine device insertion  04/30/11    Inserted by [facility]YN for endometriosis  . Laparoscopically assisted vag hysterectomy  10/27/2012  . Multiple extractions with alveoloplasty Bilateral 07/19/2013    Procedure: EXTRACTIONS #4, 8,11,91,47;  Surgeon: Francene Finders, DDS;  Location: [facility] OR;  Service: Oral Surgery;  Laterality: Bilateral;  . Pain pump implantation N/A 03/07/2014    Procedure: baclofen pump revision/replacement and Catheter connection replacement;  Surgeon: Cristi Loron, MD;  Location: [facility] OR[facility];  Service: Neurosurgery;  Laterality: N/A;  baclofen pump revision/replacement and Catheter connection replacement  . Programable baclofen pump revision   03/17/14    Battery Replacement    Social History: Social History  Substance Use Topics  . Smoking status: Never Smoker   . Smokeless tobacco: Never Used  . Alcohol Use: 0.6 oz/week    1 Glasses of wine per week     Comment: Drinks alcohol once a month.   Additional social history: None  Please also refer to relevant sections of EMR.  Family History: Family History  Problem Relation Age of Onset  . Asthma Father   . Colon cancer Maternal Grandmother     Died in her 16's  . Cancer Paternal Grandmother   . Breast cancer Paternal Grandmother     Died in her 76's  . Heart attack Maternal Grandfather     Died in his 10's  . Alzheimer's disease Paternal Grandfather     Died in his 35's   Allergies and Medications: Allergies  Allergen Reactions  . Morphine Dermatitis    Skin turned red   Current Outpatient Prescriptions on File Prior to Visit  Medication Sig Dispense Refill  . acetaminophen (TYLENOL) 325 MG tablet Take 650 mg by mouth every 8 (eight) hours as needed for mild pain.     . ARIPiprazole (ABILIFY PO) Take 1 tablet by mouth at bedtime. 1mg  tablet per MAR    . bacitracin-polymyxin b (POLYSPORIN) ointment Apply 1 g topically 2 (two) times daily. Apply around Suprapubic ostomy site    . baclofen (LIORESAL) 10 MG tablet Take 20 mg by mouth 4 (four) times daily.     . Botulinum Toxin Type A 200 UNITS SOLR     . clonazePAM (KLONOPIN) 1 MG tablet Take 1 tablet (1 mg total) by mouth 4 (four) times daily. 120 tablet 5  . flavoxATE (URISPAS) 100 MG tablet Take 100 mg by mouth 3 (three) times daily as needed for bladder spasms.    [name] Kitchen FLUoxetine (PROZAC) 10 MG  capsule Take 10 mg by mouth daily.    [name] Kitchen HYDROcodone-acetaminophen (NORCO/VICODIN) 5-325 MG per tablet Take 1 tablet by mouth every 6 (six) hours as needed (pain in both knees). 60 tablet 0  . ibuprofen (ADVIL,MOTRIN) 200 MG tablet Take 400 mg by mouth every 8 (eight) hours as needed for mild pain.     . Multiple Vitamin  (MULTIVITAMIN) tablet Take 1 tablet by mouth daily.    . OnabotulinumtoxinA (BOTOX IJ) Inject 300 Units as directed every 3 (three) months.    . polyethylene glycol powder (GLYCOLAX/MIRALAX) powder Take 17 g by mouth 2 (two) times daily.    . QUEtiapine (SEROQUEL) 100 MG tablet Take 1 tablet (100 mg total) by mouth at bedtime.    . rivaroxaban (XARELTO) 20 MG TABS tablet Take 20 mg by mouth daily with supper.    . senna-docusate (SENOKOT-S) 8.6-50 MG per tablet Take 1 tablet by mouth 2 (two) times daily.    . SUMAtriptan (IMITREX) 100 MG tablet Take 100 mg by mouth every 2 (two) hours as needed for migraine or headache. May repeat in 2 hours if headache persists or recurs.    [name] Kitchen tiZANidine (ZANAFLEX) 4 MG tablet Take 1 tablet (4 mg total) by mouth 2 (two) times daily. Admin at 04:30 and qHS     No current facility-administered medications on file prior to visit.    Objective: LMP 04/05/2011 Exam: General: Lying in bed. Multiple jerky movements in hands. Patient alert and conversing. Eyes: PERRL ENTM: Nares patent, oropharynx clear Neck: Supple Cardiovascular: Regular rate and rhythm Respiratory: Clear to auscultation Abdomen: Soft, non-tender, firm mass on left side, normal bowel sounds. Suprapubic catheter in place. Ostomy looks normal MSK: Moves arms spontaneously and without control Skin: No rashes Neuro: Alert, oriented, spastic Psych: Flat affect   Narda Bonds, MD 08/30/2015, 3:45 PM PGY-3, [facility]

## 2015-09-14 ENCOUNTER — Telehealth: Payer: Self-pay | Admitting: Family Medicine

## 2015-09-14 NOTE — Telephone Encounter (Signed)
[facility] After [facility]  Person calling: Nurse Relationship to patient: Nurse  Chief complaint: Vaginal Bleeding  Patient with vaginal bleeding that started tonight. According to patient, she does not have menstrual cycles so this is worrisome. Patient has no pain, fevers, nausea, vomiting. No blood in catheter bag. Patient has recent history of bladder neck surgery. She is otherwise stable. Will order hemoglobin tonight. Will send message to geriatrics resident for follow-up in AM.  Jacquelin Hawkingalph Perla Echavarria, MD PGY-3, [facility] 09/14/2015, 8:52 PM

## 2015-09-20 ENCOUNTER — Other Ambulatory Visit: Payer: Self-pay | Admitting: *Deleted

## 2015-09-20 MED ORDER — CLONAZEPAM 1 MG PO TABS: 1.0000 mg | ORAL_TABLET | Freq: Four times a day (QID) | ORAL | Status: DC

## 2015-09-22 ENCOUNTER — Encounter: Payer: Self-pay | Admitting: Pharmacist

## 2015-09-22 DIAGNOSIS — Z9350 Unspecified cystostomy status: Secondary | ICD-10-CM | POA: Diagnosis not present

## 2015-09-22 DIAGNOSIS — N39498 Other specified urinary incontinence: Secondary | ICD-10-CM | POA: Diagnosis not present

## 2015-09-24 ENCOUNTER — Emergency Department (HOSPITAL_COMMUNITY): Payer: Medicare Other

## 2015-09-24 ENCOUNTER — Encounter (HOSPITAL_COMMUNITY): Payer: Self-pay | Admitting: Nurse Practitioner

## 2015-09-24 ENCOUNTER — Emergency Department (HOSPITAL_COMMUNITY)
Admission: EM | Admit: 2015-09-24 | Discharge: 2015-09-24 | Disposition: A | Discharge status: Other Acute Inpatient Hospital | Payer: Medicare Other | Attending: Emergency Medicine | Admitting: Emergency Medicine

## 2015-09-24 DIAGNOSIS — I10 Essential (primary) hypertension: Secondary | ICD-10-CM | POA: Insufficient documentation

## 2015-09-24 DIAGNOSIS — Y658 Other specified misadventures during surgical and medical care: Secondary | ICD-10-CM | POA: Diagnosis not present

## 2015-09-24 DIAGNOSIS — Z9229 Personal history of other drug therapy: Secondary | ICD-10-CM | POA: Diagnosis not present

## 2015-09-24 DIAGNOSIS — Z86711 Personal history of pulmonary embolism: Secondary | ICD-10-CM | POA: Diagnosis not present

## 2015-09-24 DIAGNOSIS — Z8701 Personal history of pneumonia (recurrent): Secondary | ICD-10-CM | POA: Insufficient documentation

## 2015-09-24 DIAGNOSIS — D72829 Elevated white blood cell count, unspecified: Secondary | ICD-10-CM

## 2015-09-24 DIAGNOSIS — Z7901 Long term (current) use of anticoagulants: Secondary | ICD-10-CM | POA: Diagnosis not present

## 2015-09-24 DIAGNOSIS — Z792 Long term (current) use of antibiotics: Secondary | ICD-10-CM | POA: Diagnosis not present

## 2015-09-24 DIAGNOSIS — Z8742 Personal history of other diseases of the female genital tract: Secondary | ICD-10-CM | POA: Insufficient documentation

## 2015-09-24 DIAGNOSIS — D62 Acute posthemorrhagic anemia: Secondary | ICD-10-CM | POA: Diagnosis not present

## 2015-09-24 DIAGNOSIS — F329 Major depressive disorder, single episode, unspecified: Secondary | ICD-10-CM | POA: Diagnosis not present

## 2015-09-24 DIAGNOSIS — G800 Spastic quadriplegic cerebral palsy: Secondary | ICD-10-CM | POA: Diagnosis not present

## 2015-09-24 DIAGNOSIS — R000 Tachycardia, unspecified: Secondary | ICD-10-CM | POA: Diagnosis not present

## 2015-09-24 DIAGNOSIS — N99820 Postprocedural hemorrhage and hematoma of a genitourinary system organ or structure following a genitourinary system procedure: Secondary | ICD-10-CM | POA: Diagnosis present

## 2015-09-24 DIAGNOSIS — Z872 Personal history of diseases of the skin and subcutaneous tissue: Secondary | ICD-10-CM | POA: Insufficient documentation

## 2015-09-24 DIAGNOSIS — F429 Obsessive-compulsive disorder, unspecified: Secondary | ICD-10-CM | POA: Insufficient documentation

## 2015-09-24 DIAGNOSIS — G43909 Migraine, unspecified, not intractable, without status migrainosus: Secondary | ICD-10-CM | POA: Diagnosis not present

## 2015-09-24 DIAGNOSIS — N939 Abnormal uterine and vaginal bleeding, unspecified: Secondary | ICD-10-CM | POA: Diagnosis not present

## 2015-09-24 DIAGNOSIS — R0602 Shortness of breath: Secondary | ICD-10-CM | POA: Diagnosis not present

## 2015-09-24 DIAGNOSIS — N390 Urinary tract infection, site not specified: Secondary | ICD-10-CM | POA: Insufficient documentation

## 2015-09-24 DIAGNOSIS — A419 Sepsis, unspecified organism: Secondary | ICD-10-CM | POA: Diagnosis not present

## 2015-09-24 DIAGNOSIS — I959 Hypotension, unspecified: Secondary | ICD-10-CM | POA: Diagnosis not present

## 2015-09-24 DIAGNOSIS — N319 Neuromuscular dysfunction of bladder, unspecified: Secondary | ICD-10-CM | POA: Diagnosis not present

## 2015-09-24 LAB — CBC WITH DIFFERENTIAL/PLATELET
BASOS ABS: 0 10*3/uL (ref 0.0–0.1)
Basophils Relative: 0 %
EOS PCT: 1 %
Eosinophils Absolute: 0.1 10*3/uL (ref 0.0–0.7)
HCT: 35.6 % — ABNORMAL LOW (ref 36.0–46.0)
Hemoglobin: 11 g/dL — ABNORMAL LOW (ref 12.0–15.0)
Lymphocytes Relative: 14 %
Lymphs Abs: 1.5 10*3/uL (ref 0.7–4.0)
MCH: 26.4 pg (ref 26.0–34.0)
MCHC: 30.9 g/dL (ref 30.0–36.0)
MCV: 85.6 fL (ref 78.0–100.0)
Monocytes Absolute: 1 10*3/uL (ref 0.1–1.0)
Monocytes Relative: 9 %
Neutro Abs: 8.5 10*3/uL — ABNORMAL HIGH (ref 1.7–7.7)
Neutrophils Relative %: 76 %
PLATELETS: 277 10*3/uL (ref 150–400)
RBC: 4.16 MIL/uL (ref 3.87–5.11)
RDW: 16.5 % — ABNORMAL HIGH (ref 11.5–15.5)
WBC: 11.1 10*3/uL — AB (ref 4.0–10.5)

## 2015-09-24 LAB — I-STAT CHEM 8, ED
BUN: 7 mg/dL (ref 6–20)
CALCIUM ION: 1.02 mmol/L — AB (ref 1.12–1.23)
Chloride: 104 mmol/L (ref 101–111)
Creatinine, Ser: 0.4 mg/dL — ABNORMAL LOW (ref 0.44–1.00)
Glucose, Bld: 100 mg/dL — ABNORMAL HIGH (ref 65–99)
HEMATOCRIT: 33 % — AB (ref 36.0–46.0)
HEMOGLOBIN: 11.2 g/dL — AB (ref 12.0–15.0)
Potassium: 4.1 mmol/L (ref 3.5–5.1)
SODIUM: 136 mmol/L (ref 135–145)
TCO2: 18 mmol/L (ref 0–100)

## 2015-09-24 LAB — CBC
HCT: 26 % — ABNORMAL LOW (ref 36.0–46.0)
Hemoglobin: 8.1 g/dL — ABNORMAL LOW (ref 12.0–15.0)
MCH: 26.6 pg (ref 26.0–34.0)
MCHC: 31.2 g/dL (ref 30.0–36.0)
MCV: 85.2 fL (ref 78.0–100.0)
Platelets: 333 10*3/uL (ref 150–400)
RBC: 3.05 MIL/uL — ABNORMAL LOW (ref 3.87–5.11)
RDW: 16.7 % — AB (ref 11.5–15.5)
WBC: 15.5 10*3/uL — ABNORMAL HIGH (ref 4.0–10.5)

## 2015-09-24 LAB — URINE MICROSCOPIC-ADD ON

## 2015-09-24 LAB — URINALYSIS, ROUTINE W REFLEX MICROSCOPIC
Bilirubin Urine: NEGATIVE
GLUCOSE, UA: NEGATIVE mg/dL
Ketones, ur: NEGATIVE mg/dL
Nitrite: POSITIVE — AB
PH: 6.5 (ref 5.0–8.0)
PROTEIN: 30 mg/dL — AB
SPECIFIC GRAVITY, URINE: 1.018 (ref 1.005–1.030)
Urobilinogen, UA: 1 mg/dL (ref 0.0–1.0)

## 2015-09-24 LAB — ABO/RH: ABO/RH(D): A POS

## 2015-09-24 LAB — I-STAT CG4 LACTIC ACID, ED
LACTIC ACID, VENOUS: 2.22 mmol/L — AB (ref 0.5–2.0)
Lactic Acid, Venous: 0.62 mmol/L (ref 0.5–2.0)

## 2015-09-24 LAB — BASIC METABOLIC PANEL
ANION GAP: 10 (ref 5–15)
BUN: 9 mg/dL (ref 6–20)
CO2: 23 mmol/L (ref 22–32)
Calcium: 8.7 mg/dL — ABNORMAL LOW (ref 8.9–10.3)
Chloride: 101 mmol/L (ref 101–111)
Creatinine, Ser: 0.48 mg/dL (ref 0.44–1.00)
GFR calc Af Amer: >60 mL/min (ref >60)
GLUCOSE: 94 mg/dL (ref 65–99)
Potassium: 3.9 mmol/L (ref 3.5–5.1)
SODIUM: 134 mmol/L — AB (ref 135–145)

## 2015-09-24 LAB — PREPARE RBC (CROSSMATCH)

## 2015-09-24 MED ORDER — ACETAMINOPHEN 500 MG PO TABS
1000.0000 mg | ORAL_TABLET | Freq: Once | ORAL | Status: AC
  Administered 2015-09-24: 1000 mg via ORAL
  Filled 2015-09-24: qty 2

## 2015-09-24 MED ORDER — SODIUM CHLORIDE 0.9 % IV BOLUS (SEPSIS)
2000.0000 mL | Freq: Once | INTRAVENOUS | Status: AC
Start: 2015-09-24 — End: 2015-09-24
  Administered 2015-09-24: 2000 mL via INTRAVENOUS

## 2015-09-24 MED ORDER — IOHEXOL 300 MG/ML  SOLN
100.0000 mL | Freq: Once | INTRAMUSCULAR | Status: AC | PRN
  Administered 2015-09-24: 100 mL via INTRAVENOUS

## 2015-09-24 MED ORDER — HYDROMORPHONE HCL 1 MG/ML IJ SOLN
1.0000 mg | Freq: Once | INTRAMUSCULAR | Status: AC
Start: 2015-09-24 — End: 2015-09-24
  Administered 2015-09-24: 1 mg via INTRAVENOUS
  Filled 2015-09-24: qty 1

## 2015-09-24 MED ORDER — SILVER NITRATE-POT NITRATE 75-25 % EX MISC
1.0000 "application " | Freq: Once | CUTANEOUS | Status: AC
  Administered 2015-09-24: 1 via TOPICAL
  Filled 2015-09-24: qty 1

## 2015-09-24 MED ORDER — IOHEXOL 350 MG/ML SOLN
100.0000 mL | Freq: Once | INTRAVENOUS | Status: AC | PRN
  Administered 2015-09-24: 50 mL via INTRAVENOUS

## 2015-09-24 MED ORDER — HYDROCODONE-ACETAMINOPHEN 5-325 MG PO TABS
1.0000 | ORAL_TABLET | Freq: Once | ORAL | Status: AC
  Administered 2015-09-24: 1 via ORAL
  Filled 2015-09-24: qty 1

## 2015-09-24 MED ORDER — DEXTROSE 5 % IV SOLN
2.0000 g | INTRAVENOUS | Status: DC
  Administered 2015-09-24: 2 g via INTRAVENOUS
  Filled 2015-09-24: qty 2

## 2015-09-24 MED ORDER — ONDANSETRON HCL 4 MG/2ML IJ SOLN
4.0000 mg | Freq: Once | INTRAMUSCULAR | Status: AC
  Administered 2015-09-24: 4 mg via INTRAVENOUS
  Filled 2015-09-24: qty 2

## 2015-09-24 NOTE — ED Notes (Addendum)
Dr. Celene KrasBrooten at bedside to attempt US guided IV, decrease vaginal bleeding with surgicel and curlex.

## 2015-09-24 NOTE — ED Notes (Signed)
Dr. Celene KrasBrooten and [name] attempting pelvic

## 2015-09-24 NOTE — ED Notes (Signed)
Pt changed, two incontinence pads soaked with blood and several large baseball size clots. Pt became nauseated and started dry heaving during cleaning.

## 2015-09-24 NOTE — ED Provider Notes (Signed)
Prior to [facility], I received a sign out report from Mady GemmaElizabeth C Westfall, PA-C. Patient had been having intermittent vaginal bleeding which appeared stable;  However, just prior to  ambulance crew transporting patient from the emergency department she was noted to have copious amounts of blood and clots from her vaginal area. Patient vital signs initially remained unchanged. Additional IV access was obtained initial repeat CBC was stable however patient was subsequently more hypotensive. Discussed  Xarelto reversal with pharmacy and available agents not considered effective in this setting. Although initial bleeding appeared stable patient appeared to have ongoing bleeding and impending hemodynamic compromise. Patient was discussed with gynecology for recommendations  regarding temporizing measures in the setting of vaginal bleeding on anticoagulation. They recommended packing with topical hemostatic agents including Ferric subsulfate and Surgicel.   Vaginal packing preformed with kerlix and surgicel.  Additional IV access was difficult to obtain , and subsequently patient required ultrasound-guided EJ.   Repeat CBC showed a hemoglobin drop approximately 3g/dl  while  in the emergency department.   Patient was crossed  and administered one unit of PRBCs. Blood pressure improved. Patient stable for transfer. On repeat examination vaginal bleeding stabilized. Patient also was examined by gynecology consultant approximately 30 minutes after initial attempt at vaginal packing for hemostasis. They identified the site of bleeding as likely postoperative along the vaginal wall from patient's recent bladder neck closure surgery.  Site appeared hemostatic and was repacked by gynecologist. [facility].  Patient care was discussed with my attending, Dr. Radford PaxBeaton.   Gavin PoundJustin Demeshia Sherburne, MD 09/25/15 [id number]

## 2015-09-24 NOTE — ED Notes (Signed)
Patient incontinence pads soaked in blood with large clots. Dr. Radford PaxBeaton called to bedside. Will hold off on transport to [facility]. Attempted IV stick on patient for second line x2 and for repeat hgb. Phlebotomy called.

## 2015-09-24 NOTE — ED Notes (Signed)
Pt still at CT.

## 2015-09-24 NOTE — ED Provider Notes (Signed)
CSN: [id number]     Arrival date & time 09/24/15  1150 History   First MD Initiated Contact with Patient 09/24/15 1152     Chief Complaint  Patient presents with  . Vaginal Bleeding    HPI   [name] [name] is a 39 y.o. female with a PMH of cerebral palsy, HTN, depression, suprapubic catheter who presents to the ED with vaginal bleeding. She states she had bladder surgery 4 weeks ago, and has has bleeding since that time. She reports her bleeding has become heavier over the past several days. She denies exacerbating or alleviating factors. She denies fever, chills, chest pain, shortness of breath, abdominal pain, N/V/D/C.   Past Medical History  Diagnosis Date  . Cerebral palsy ([name])   . Pulmonary embolism ([name])     Lifetime Coumadin  . Cerebral palsy ([name])     Spastic Cerebral palsy, mentally intact  . Contracture, joint, multiple sites     Electric wheelchair, uses left hand to operate chair.   [name] Kitchen. Hypertension   . OCD (obsessive compulsive disorder)   . Depression   . Migraine   . [name]   . Dysarthria   . Endometriosis   . History of recurrent UTIs   . GERD (gastroesophageal reflux disease)   . Pulmonary embolism ([name]) 2011, 01/2011    Will be on lifetime coumadin  . Anemia   . Abdominal pain 01/02/2012    Overview:  Overview:  Per Previous pcp note- Dr. Ta--Pt has history of endometriosis and had DUB in 2011.  She She had u/s that showed normal endometrial stripe.  She had endometrial bx that was wnl.  She has a lot of abd cramping every month.  This cramping was sometimes not relieved with hydrocodone.  [facility] for IUD placement to help endometriosis and abd cramping.  [name]  . Macroscopic hematuria 03/10/2013    status post replacement of suprapubic tube 03/04/2013   . HYPERTENSION, BENIGN 01/31/2010    Qualifier: Diagnosis of  By: Gala RomneyBensimhon, MD, Trixie DredgeFACC, Daniel R   . History of endometriosis 10/2012    OBGYN [facility]: Laparoscopic  Endometrial Ablation, TVH,  . Gram positive sepsis ([name]) 10/24/2014  . HCAP (healthcare-associated pneumonia) 10/25/2014  . Abdominal pain 01/02/2012    Overview:  Overview:  Per Previous pcp note- Dr. Ta--Pt has history of endometriosis and had DUB in 2011.  She She had u/s that showed normal endometrial stripe.  She had endometrial bx that was wnl.  She has a lot of abd cramping every month.  This cramping was sometimes not relieved with hydrocodone.  [facility] was [facility] to [facility] for IUD placement to help endometriosis and abd cramping.  [name] [facility] by [facility].  Pt still complains of abd cramping, which I am treating with hydrocodone and ultram. Pt reports some initial relief of pain from IUD, but states it has now returned back to similar to previous cramping. Gave patient a shot of Toradol 60 mg IM today-since positive exacerbation of abdominal cramping during past 2 weeks. Physical exam reassuring. [facility] since she is plugged in with the GYN providers there for further treatment options regarding endometriosis and abdominal cramping. The dysfunctional uterine bleeding now well contr  . Acute respiratory failure with hypoxia ([name])   . Cellulitis 12/22/2014  . Disease of female genital organs 10/24/2010    Overview:  Overview:  Pt has history of  endometriosis and had DUB in 2011.  She had u/s that showed normal endometrial stripe.  She had endometrial bx that was wnl.  She has a lot of abd cramping every month.  This cramping was sometimes not relieved with hydrocodone.  She was referred to [facility] for IUD placement to help endometriosis and abd cramping.  [name] was placed 04/30/11 by [facility].  Pt still complains of abd cramping, which was treated with hydrocodone and ultram.   Last seen at [facility] by OB/GYN 12/24/11, given Depo Lupron injection.  Ordering CT scan to r/o additional cause of abdominal pain (pt with known endometriosis  and will not tolerate vaginal U/S, pain not improved s/p [name] or with Lupron injection-- only caused hot flashes and amenorrhea, no pain relief).  Also made referral to pain clinic for better pain control.  Do not think hysterectomy would help with pain, and concern for contamination of baclofen pump if did proceed with surgery.    8/[facility]/13[name]: Pt was seen by   . Dyspnea and respiratory abnormality 02/04/2011    Overview:  Overview:  Pt has history of recurrent PE and is on chronic coumadin.  Pt has been complaining of dyspnea that she describes as chest tightness.  She has been to the ER multiple times for this.  Usually she gets CTA and serial CE.  She has had at least 6 CTA in a period of since 2011.  She also has an O2 requirement. It is unclear why pt feels dyspneic, complains of chest tightness, or has O2 requirement.  I thought that her complaint may be from deconditioning and referred her to PT.  Unfortunately Medicaid only pays for 4 PT visits.  Pt should be doing these exercises at home, but she has not.  I have referred her to [facility], and she has been seen by [name].  He did not understand the etiology of her dyspnea and O2 requirement.  He will continue to follow her.   Last Assessment & Plan:  Pt presents with concerns for chest tightness and dyspnea.  She was seen in ED yesterday and discharged home.  While there she had a CT chest 04/20/11 showed 1.  Multifocal degradation as detailed above.  N  . Gross hematuria 03/12/2013    Overview:  Overview:  status post replacement of suprapubic tube 03/04/2013  Last Assessment & Plan:  Recent urine culture collected in ED on 5/4 did not result in significant growth. Moreover, patient has been asymptomatic with normal WBC, no fever and no true symptoms making symptomatic UTI less likely.  Will continue to monitor for any sign of infection.  Patient's xarelto had been held during her recent hospitalization due to hematuria. This has since been  re-started. Monitor bleeding.    [name] Kitchen History of anticoagulant therapy 10/21/2012  . Infantile cerebral palsy ([name]) 01/29/2007    Overview:  She has spastic CP.  She is mentally intact.  She is wheelchair bound.  She is wheelchair bound for ambulation      . Intracervical pessary 05/07/2011    Overview:  Overview:  Placed by [facility] 04/30/11 to treat DUB and Endometriosis.  She is seen at GYN clinic at [facility] but was considered a poor surgical candidate and referred her to [facility].  For DUB she had pelvic ultrasound that showed thin stripe and she had endometrial Bx by Dr Jennette Kettle in GYN clinic, which was negative.     . Parapneumonic effusion 10/25/2014  .  Presence of intrathecal baclofen pump 03/07/2014    R LQ. Insertion T10.    [name] Kitchen Psychiatric illness 10/31/2014  . Recurrent pulmonary embolism ([name]) 05/03/2011    Pt has history of recurrent PE and is on chronic coumadin.  Pt has been complaining of dyspnea that she describes as chest tightness.  She has been to the ER multiple times for this.  Usually she gets CTA and serial CE.  She has had at least 6 CTA in a period of since 2011.  She also has an O2 requirement. It is unclear why pt feels dyspneic, complains of chest tightness, or has O2 requirement.  I thought that her complaint may be from deconditioning and referred her to PT.  Unfortunately Medicaid only pays for 4 PT visits.  Pt should be doing these exercises at home, but she has not.  I have referred her to [facility], and she has been seen by [name].  He did not understand the etiology of her dyspnea and O2 requirement.  He will continue to follow her.     . Seborrheic keratosis 01/18/2011  . Spasticity 01/05/2014  . Transient alteration of awareness, recurrent 09/08/2006    Recurrent episodes where Sharday  loses contact with her world and that have been studied thoroughly and appear nonepileptic in nature per Dr Terrace Arabia (neurology) The patient has had episodes starting in 2007 that initially began  1-4 times a day during which time she would have periods of unawareness followed by confusion. This continuous video EEG monitoring performed from October 8-12, 2007. Had   . Acute cystitis without hematuria   . Acute cystitis with hematuria   . Urethra dilated and patulous 07/03/2015    Patulous, Dilated Urethra. 5mL balloon on a 22-Fr catheter hoping this would prevent the bladder spasm from pushing the balloon down the urethra (Dr Logan Bores, Urology [facility], July 2016)    Past Surgical History  Procedure Laterality Date  . Colposcopy  06/2000  . Cesarean section      x 2  . Tubal ligation  2003  . Carpal tunnel release  08/2008    Dr Teressa Senter  . Wrist surgery  06/2010    Dr Dierdre Searles, hand surgeon, [name]  . Baclofen pump refill      x 3 times  . Appendectomy    . Intrauterine device insertion  04/30/11    Inserted by [facility] for endometriosis  . Laparoscopically assisted vag hysterectomy  10/27/2012  . Multiple extractions with alveoloplasty Bilateral 07/19/2013    Procedure: EXTRACTIONS #4, 1,61,09,60;  Surgeon: Francene Finders, DDS;  Location: [facility] OR;  Service: Oral Surgery;  Laterality: Bilateral;  . Pain pump implantation N/A 03/07/2014    Procedure: baclofen pump revision/replacement and Catheter connection replacement;  Surgeon: Cristi Loron, MD;  Location: [facility] OR[facility];  Service: Neurosurgery;  Laterality: N/A;  baclofen pump revision/replacement and Catheter connection replacement  . Programable baclofen pump revision  03/17/14    Battery Replacement    Family History  Problem Relation Age of Onset  . Asthma Father   . Colon cancer Maternal Grandmother     Died in her 65's  . Cancer Paternal Grandmother   . Breast cancer Paternal Grandmother     Died in her 65's  . Heart attack Maternal Grandfather     Died in his 91's  . Alzheimer's disease Paternal Grandfather     Died in his 68's   Social History  Substance Use Topics  . Smoking  status: Never Smoker   .  Smokeless tobacco: Never Used  . Alcohol Use: 0.6 oz/week    1 Glasses of wine per week     Comment: Drinks alcohol once a month.   OB History    No data available      Review of Systems  Constitutional: Negative for fever and chills.  Respiratory: Negative for shortness of breath.   Cardiovascular: Negative for chest pain.  Gastrointestinal: Negative for nausea, vomiting, abdominal pain, diarrhea and constipation.  Genitourinary: Positive for vaginal bleeding and vaginal pain.       Reports vaginal bleeding and tenderness.  All other systems reviewed and are negative.     Allergies  Morphine  Home Medications   Prior to Admission medications   Medication Sig Start Date End Date Taking? Authorizing Provider  acetaminophen (TYLENOL) 325 MG tablet Take 325 mg by mouth every 8 (eight) hours as needed for mild pain.    Historical Provider, MD  ARIPiprazole (ABILIFY) 2 MG tablet Take 2 mg by mouth at bedtime.    Historical Provider, MD  bacitracin-polymyxin b (POLYSPORIN) ointment Apply 1 g topically 2 (two) times daily. Apply around Suprapubic ostomy site    Historical Provider, MD  baclofen (LIORESAL) 10 MG tablet Take 20 mg by mouth 4 (four) times daily.     Historical Provider, MD  Botulinum Toxin Type A 200 UNITS SOLR     Historical Provider, MD  clonazePAM (KLONOPIN) 1 MG tablet Take 1 tablet (1 mg total) by mouth 4 (four) times daily. 09/20/15   Kirt Boys, DO  flavoxATE (URISPAS) 100 MG tablet Take 100 mg by mouth 3 (three) times daily as needed for bladder spasms.    Historical Provider, MD  FLUoxetine (PROZAC) 10 MG capsule Take 10 mg by mouth daily.    Historical Provider, MD  HYDROcodone-acetaminophen (NORCO/VICODIN) 5-325 MG per tablet Take 1 tablet by mouth every 6 (six) hours as needed (pain in both knees). 05/15/15   Leighton Roach McDiarmid, MD  ibuprofen (ADVIL,MOTRIN) 200 MG tablet Take 400 mg by mouth every 8 (eight) hours as needed for mild pain.     Historical  Provider, MD  Multiple Vitamin (MULTIVITAMIN) tablet Take 1 tablet by mouth daily.    Historical Provider, MD  OnabotulinumtoxinA (BOTOX IJ) Inject 300 Units as directed every 3 (three) months.    Historical Provider, MD  polyethylene glycol powder (GLYCOLAX/MIRALAX) powder Take 17 g by mouth 2 (two) times daily. 06/21/15   Tyrone Nine, MD  QUEtiapine (SEROQUEL) 100 MG tablet Take 1 tablet (100 mg total) by mouth at bedtime. 06/21/15   Tyrone Nine, MD  rivaroxaban (XARELTO) 20 MG TABS tablet Take 20 mg by mouth daily with supper.    Historical Provider, MD  senna (SENOKOT) 8.6 MG TABS tablet Take 1 tablet by mouth daily.    Historical Provider, MD  senna-docusate (SENOKOT-S) 8.6-50 MG per tablet Take 1 tablet by mouth 2 (two) times daily. 01/02/15   Stephanie Coup Street, MD  sulfamethoxazole-trimethoprim (BACTRIM DS,SEPTRA DS) 800-160 MG tablet Take 1 tablet by mouth 2 (two) times daily.    Historical Provider, MD  SUMAtriptan (IMITREX) 100 MG tablet Take 100 mg by mouth every 2 (two) hours as needed for migraine or headache. May repeat in 2 hours if headache persists or recurs.    Historical Provider, MD  tiZANidine (ZANAFLEX) 4 MG tablet Take 1 tablet (4 mg total) by mouth 2 (two) times daily. Admin at 04:30 and qHS 06/28/15  Tyrone Nine, MD    BP 140/75 mmHg  Pulse 112  Temp(Src) 98.4 F (36.9 C) (Oral)  Resp 18  SpO2 96%  LMP 04/05/2011 Physical Exam  Constitutional: She is oriented to person, place, and time. She appears well-developed and well-nourished. No distress.  HENT:  Head: Normocephalic and atraumatic.  Right Ear: External ear normal.  Left Ear: External ear normal.  Nose: Nose normal.  Mouth/Throat: Uvula is midline, oropharynx is clear and moist and mucous membranes are normal.  Eyes: Lids are normal.  Neck: Normal range of motion. Neck supple.  Cardiovascular: Regular rhythm, normal heart sounds, intact distal pulses and normal pulses.  Tachycardia present.    Pulmonary/Chest: Effort normal and breath sounds normal. No respiratory distress. She has no wheezes. She has no rales.  Abdominal: Soft. Normal appearance and bowel sounds are normal. She exhibits no distension and no mass. There is no tenderness. There is no rigidity, no rebound and no guarding.  Genitourinary: There is tenderness and bleeding in the vagina.  Musculoskeletal: Normal range of motion. She exhibits no edema or tenderness.  Neurological: She is alert and oriented to person, place, and time.  Skin: Skin is warm, dry and intact. No rash noted. She is not diaphoretic. No erythema. No pallor.  Psychiatric: She has a normal mood and affect. Her speech is normal and behavior is normal.  Nursing note and vitals reviewed.   ED Course  Procedures (including critical care time)  Labs Review Labs Reviewed  CBC WITH DIFFERENTIAL/PLATELET - Abnormal; Notable for the following:    WBC 11.1 (*)    Hemoglobin 11.0 (*)    HCT 35.6 (*)    RDW 16.5 (*)    Neutro Abs 8.5 (*)    All other components within normal limits  BASIC METABOLIC PANEL - Abnormal; Notable for the following:    Sodium 134 (*)    Calcium 8.7 (*)    All other components within normal limits  URINALYSIS, ROUTINE W REFLEX MICROSCOPIC (NOT AT [facility]) - Abnormal; Notable for the following:    APPearance TURBID (*)    Hgb urine dipstick LARGE (*)    Protein, ur 30 (*)    Nitrite POSITIVE (*)    Leukocytes, UA MODERATE (*)    All other components within normal limits  URINE MICROSCOPIC-ADD ON - Abnormal; Notable for the following:    Bacteria, UA MANY (*)    Crystals CA OXALATE CRYSTALS (*)    All other components within normal limits  URINE CULTURE  I-STAT CG4 LACTIC ACID, ED    Imaging Review Ct Angio Chest Pe W/cm &/or Wo Cm  09/24/2015  CLINICAL DATA:  Dyspnea, tachycardia. EXAM: CT ANGIOGRAPHY CHEST WITH CONTRAST TECHNIQUE: Multidetector CT imaging of the chest was performed using the standard protocol during  bolus administration of intravenous contrast. Multiplanar CT image reconstructions and MIPs were obtained to evaluate the vascular anatomy. CONTRAST:  50mL OMNIPAQUE IOHEXOL 350 MG/ML SOLN COMPARISON:  CT scan of October 25, 2014. FINDINGS: No pneumothorax or pleural effusion is noted. No acute pulmonary disease is noted. There is no evidence of pulmonary embolus. There is no evidence of thoracic aortic aneurysm or dissection. Enlarged left thyroid lobe is noted with low density. Gallstones are noted in the visualized portion of the upper abdomen. No mediastinal mass or adenopathy is noted. No significant osseous abnormality is noted. Review of the MIP images confirms the above findings. IMPRESSION: No definite evidence of pulmonary embolus. Cholelithiasis is noted. Probable left  thyroid mass or nodule is noted. Thyroid ultrasound is recommended for further evaluation. Electronically Signed   By: Lupita Raider, M.D.   On: 09/24/2015 16:52   Ct Abdomen Pelvis W Contrast  09/24/2015  CLINICAL DATA:  Vaginal bleeding. The patient had surgery for bladder cancer 4 weeks ago. Foley catheter in place. EXAM: CT ABDOMEN AND PELVIS WITH CONTRAST TECHNIQUE: Multidetector CT imaging of the abdomen and pelvis was performed using the standard protocol following bolus administration of intravenous contrast. CONTRAST:  OMNIPAQUE IOHEXOL 300 MG/ML  SOLN COMPARISON:  CT scan dated 10/04/2014 FINDINGS: Lower chest:  Normal. Hepatobiliary: There are multiple stones in the gallbladder. Liver parenchyma is normal. No dilated intrahepatic bile ducts. Common bile duct is slightly prominent at 6.3 mm. Pancreas: Normal. Spleen: Normal. Adrenals/Urinary Tract: Adrenal glands are normal. Right kidney is normal. Stable 2 cm cyst in the mid left kidney. There is a suprapubic a Foley catheter in place. There is only a small amount of urine in the bladder. The wall of the bladder is markedly thickened. The patient has developed an  inhomogeneous fluid collection distending the vagina. The uterus has been removed. Ovaries are normal. There is also a fluid collection which appears to be associated with the patient's urethra just anterior to the compressed and lower portion of the vagina. Stomach/Bowel: There is a large amount of stool in the rectosigmoid portion of the colon. The bowel is otherwise normal. Vascular/Lymphatic: Normal. Reproductive: Ovaries are normal. Uterus has been removed. The superior aspect of the vagina appears to be distended with inhomogeneous fluid, possibly blood. Other: No free air or free fluid. Musculoskeletal: No acute osseous abnormality. Spinal cord stimulator in place. IMPRESSION: The patient has developed 2 fluid collections posterior to the bladder, 1 appears to be in the distended upper portion of the vagina in the lower fluid collection is either septated in the low lower portion of the vagina or is in a markedly distended urethra. Electronically Signed   By: Francene Boyers M.D.   On: 09/24/2015 16:52   I have personally reviewed and evaluated these images and lab results as part of my medical decision-making.   EKG Interpretation None      MDM   Final diagnoses:  Leukocytosis  Tachycardia  Acute UTI    39 year old female presents with vaginal bleeding. Patient underwent bladder neck closure 09/23, and has had bleeding since that time. Reports her bleeding has become heavier over the past several days. Denies fever, chills, chest pain, shortness of breath, abdominal pain, N/V/D/C.  Patient is afebrile. Tachycardic to 110s. Heart regular rhythm. Lungs clear to auscultation bilaterally. Abdomen soft, non-tender, non-distended. Large amount of blood in vaginal vault, unable to visualize source of bleeding.  CBC remarkable for leukocytosis of 11, hemoglobin 11. BMP unremarkable. Lactic acid within normal limits.  Now tachycardic to 150s.  EKG reveals sinus tachycardia, HR 130. Patient  given 2L NS. Now febrile to 102.  UA remarkable for positive nitrites, moderate leukocytes, large hemoglobin; microscopic with 21-50 white blood cells, too numerous to count red blood cells. Patient given rocephin. Will obtain CT abdomen pelvis given recent surgery. Given tachycardia and history of PE, will also obtain CT PE. CT abdomen pelvis reveals 2 fluid collections posterior to the bladder, 1 appears to be in the distended upper portion of the vagina, the lower fluid collection is either septated in the lower portion of the vagina or is in a markedly distended urethra. CT PE negative for  PE or acute pulmonary disease, reveals probable left thyroid mass or nodule.  Urology at [name] consulted (her urologist is Dr. Logan Bores).  Spoke with Dr. Rebeca Allegra, patient to be transferred to [facility] ED, urology will see her at there. [facility] ED to let them know patient was coming.  BP 112/83 mmHg  Pulse 135  Temp(Src) 102 F (38.9 C) (Oral)  Resp 21  SpO2 100%  LMP 04/05/2011        Mady Gemma, PA-C 09/24/15 1750  Lyndal Pulley, MD 09/25/15 1650

## 2015-09-24 NOTE — ED Notes (Signed)
Dr. Shawnie PonsPratt at bedside performing pelvic and attempting to stop bleeding. Pt now spotting, after surgicel

## 2015-09-24 NOTE — Consult Note (Addendum)
Center for [facility] Faculty Practice - OB/GYN  Impression: 1. Heavy vaginal bleeding due to anticoagulation and non-healed surgical incision 2. Acute blood loss anemia 3. Urosepsis 4. H/o PE on chronic anticoagulation 5. Cerebral palsy  Recommendations: Continue packing for control of bleeding Blood replacement S/p Rocephin 2 gms Agree with transfer to [place], where her physicians are located--stable as vaginal bleeding is much lighter and well controlled.  Reason for consult: Patient is a 39 y.o. female who is s/p LAVH in 2013 who presented to ED with vaginal bleeding.  She is s/p ureteral surgery 4 wks ago and has been bleeding since that time.  Since arrival to ED, bleeding is much worse and she has dropped her hgb 3 gms to 8. She has had surgicel placed with packing with Kerlix. She is found to be febrile, hypotensive and tachycardi. She was set for transfer but felt to be too unstable with her bleeding.  We are asked to see the patient regarding identifying and treating source of bleeding.  Past Medical History  Diagnosis Date  . Cerebral palsy ([name])   . Pulmonary embolism ([name])     Lifetime Coumadin  . Cerebral palsy ([name])     Spastic Cerebral palsy, mentally intact  . Contracture, joint, multiple sites     Electric wheelchair, uses left hand to operate chair.   [name] Kitchen Hypertension   . OCD (obsessive compulsive disorder)   . Depression   . Migraine   . [name]   . Dysarthria   . Endometriosis   . History of recurrent UTIs   . GERD (gastroesophageal reflux disease)   . Pulmonary embolism ([name]) 2011, 01/2011    Will be on lifetime coumadin  . Anemia   . Abdominal pain 01/02/2012    Overview:  Overview:  Per Previous pcp note- Dr. Ta--Pt has history of endometriosis and had DUB in 2011.  She She had u/s that showed normal endometrial stripe.  She had endometrial bx that was wnl.  She has a lot of abd cramping every month.  This cramping was  sometimes not relieved with hydrocodone.  She was referred to [facility] for IUD placement to help endometriosis and abd cramping.  [name]  . Macroscopic hematuria 03/10/2013    status post replacement of suprapubic tube 03/04/2013   . HYPERTENSION, BENIGN 01/31/2010    Qualifier: Diagnosis of  By: [name], MD, [name] History of endometriosis 10/2012    OBGYN [facility]: Laparoscopic Endometrial Ablation, TVH,  . Gram positive sepsis ([name]) 10/24/2014  . HCAP (healthcare-associated pneumonia) 10/25/2014  . Abdominal pain 01/02/2012    Overview:  Overview:  Per Previous pcp note- Dr. Ta--Pt has history of endometriosis and had DUB in 2011.  She She had u/s that showed normal endometrial stripe.  She had endometrial bx that was wnl.  She has a lot of abd cramping every month.  This cramping was sometimes not relieved with hydrocodone.  She was referred to [facility] for IUD placement to help endometriosis and abd cramping.  [name] was placed 04/30/11 by [facility].  Pt still complains of abd cramping, which I am treating with hydrocodone and ultram. Pt reports some initial relief of pain from IUD, but states it has now returned back to similar to previous cramping. Gave patient a shot of Toradol 60 mg IM today-since positive exacerbation of abdominal cramping during past 2 weeks. Physical exam reassuring. Patient may need to return to [facility] for  further workup since she is plugged in with the GYN providers there for further treatment options regarding endometriosis and abdominal cramping. The dysfunctional uterine bleeding now well contr  . Acute respiratory failure with hypoxia ([name])   . Cellulitis 12/22/2014  . Disease of female genital organs 10/24/2010    Overview:  Overview:  Pt has history of endometriosis and had DUB in 2011.  She had u/s that showed normal endometrial stripe.  She had endometrial bx that was wnl.  She has a lot of abd cramping every month.  This cramping was sometimes not relieved  with hydrocodone.  She was referred to [facility] for IUD placement to help endometriosis and abd cramping.  [name] was placed 04/30/11 by [facility].  Pt still complains of abd cramping, which was treated with hydrocodone and ultram.   Last seen at [facility] by OB/GYN 12/24/11, given Depo Lupron injection.  Ordering CT scan to r/o additional cause of abdominal pain (pt with known endometriosis and will not tolerate vaginal U/S, pain not improved s/p [name] or with Lupron injection-- only caused hot flashes and amenorrhea, no pain relief).  Also made referral to pain clinic for better pain control.  Do not think hysterectomy would help with pain, and concern for contamination of baclofen pump if did proceed with surgery.    8/5/13[name]: Pt was seen by   . Dyspnea and respiratory abnormality 02/04/2011    Overview:  Overview:  Pt has history of recurrent PE and is on chronic coumadin.  Pt has been complaining of dyspnea that she describes as chest tightness.  She has been to the ER multiple times for this.  Usually she gets CTA and serial CE.  She has had at least 6 CTA in a period of since 2011.  She also has an O2 requirement. It is unclear why pt feels dyspneic, complains of chest tightness, or has O2 requirement.  I thought that her complaint may be from deconditioning and referred her to PT.  Unfortunately Medicaid only pays for 4 PT visits.  Pt should be doing these exercises at home, but she has not.  I have referred her to [facility], and she has been seen by [name].  He did not understand the etiology of her dyspnea and O2 requirement.  He will continue to follow her.   Last Assessment & Plan:  Pt presents with concerns for chest tightness and dyspnea.  She was seen in ED yesterday and discharged home.  While there she had a CT chest 04/20/11 showed 1.  Multifocal degradation as detailed above.  N  . Gross hematuria 03/12/2013    Overview:  Overview:  status post replacement of suprapubic tube 03/04/2013   Last Assessment & Plan:  Recent urine culture collected in ED on 5/4 did not result in significant growth. Moreover, patient has been asymptomatic with normal WBC, no fever and no true symptoms making symptomatic UTI less likely.  Will continue to monitor for any sign of infection.  Patient's xarelto had been held during her recent hospitalization due to hematuria. This has since been re-started. Monitor bleeding.    [name] Kitchen History of anticoagulant therapy 10/21/2012  . Infantile cerebral palsy ([name]) 01/29/2007    Overview:  She has spastic CP.  She is mentally intact.  She is wheelchair bound.  She is wheelchair bound for ambulation      . Intracervical pessary 05/07/2011    Overview:  Overview:  Placed by [facility] 04/30/11 to  treat DUB and Endometriosis.  She [facility] but was considered a poor surgical candidate and [facility]  For DUB she had pelvic ultrasound that showed thin stripe and she had endometrial Bx by Dr Jennette KettleNeal in GYN clinic, which was negative.     . Parapneumonic effusion 10/25/2014  . Presence of intrathecal baclofen pump 03/07/2014    R LQ. Insertion T10.    [name] Kitchen. Psychiatric illness 10/31/2014  . Recurrent pulmonary embolism ([name]) 05/03/2011    Pt has history of recurrent PE and is on chronic coumadin.  Pt has been complaining of dyspnea that she describes as chest tightness.  She has been to the ER multiple times for this.  Usually she gets CTA and serial CE.  She has had at least 6 CTA in a period of since 2011.  She also has an O2 requirement. It is unclear why pt feels dyspneic, complains of chest tightness, or has O2 requirement.  I thought that her complaint may be from deconditioning and referred her to PT.  Unfortunately Medicaid only pays for 4 PT visits.  Pt should be doing these exercises at home, but she has not.  [facility] and she has been seen by Dr Marchelle Gearingamaswamy.  He did not understand the etiology of her dyspnea and O2  requirement.  He will continue to follow her.     . Seborrheic keratosis 01/18/2011  . Spasticity 01/05/2014  . Transient alteration of awareness, recurrent 09/08/2006    Recurrent episodes where [name]  loses contact with her world and that have been studied thoroughly and appear nonepileptic in nature per Dr Terrace ArabiaYan (neurology) The patient has had episodes starting in 2007 that initially began 1-4 times a day during which time she would have periods of unawareness followed by confusion. This continuous video EEG monitoring performed from October 8-12, 2007. Had   . Acute cystitis without hematuria   . Acute cystitis with hematuria   . Urethra dilated and patulous 07/03/2015    Patulous, Dilated Urethra. 5mL balloon on a 22-Fr catheter hoping this would prevent the bladder spasm from pushing the balloon down the urethra (Dr Logan BoresEvans, Urology [facility], July 2016)     Past Surgical History  Procedure Laterality Date  . Colposcopy  06/2000  . Cesarean section      x 2  . Tubal ligation  2003  . Carpal tunnel release  08/2008    Dr Teressa SenterSypher  . Wrist surgery  06/2010    Dr Dierdre SearlesLi[facility]  . Baclofen pump refill      x 3 times  . Appendectomy    . Intrauterine device insertion  04/30/11    Inserted by [facility] for endometriosis  . Laparoscopically assisted vag hysterectomy  10/27/2012  . Multiple extractions with alveoloplasty Bilateral 07/19/2013    Procedure: EXTRACTIONS #4, 0,98,11,915,11,16,19;  Surgeon: Francene Findershristopher L , DDS;  [facility];  Service: Oral Surgery;  Laterality: Bilateral;  . Pain pump implantation N/A 03/07/2014    Procedure: baclofen pump revision/replacement and Catheter connection replacement;  Surgeon: Cristi LoronJeffrey D Jenkins, MD;  Location: [facility];  Service: Neurosurgery;  Laterality: N/A;  baclofen pump revision/replacement and Catheter connection replacement  . Programable baclofen pump revision  03/17/14    Battery Replacement     Family History  Problem Relation  Age of Onset  . Asthma Father   . Colon cancer Maternal Grandmother  Died in her 88's  . Cancer Paternal Grandmother   . Breast cancer Paternal Grandmother     Died in her 54's  . Heart attack Maternal Grandfather     Died in his 55's  . Alzheimer's disease Paternal Grandfather     Died in his 105's    Social History   Social History  . Marital Status: Divorced    Spouse Name: N/A  . Number of Children: 2  . Years of Education: college   Occupational History  .    [name] Kitchen UNEMPLOYED    Social History Main Topics  . Smoking status: Never Smoker   . Smokeless tobacco: Never Used  . Alcohol Use: 0.6 oz/week    1 Glasses of wine per week     Comment: Drinks alcohol once a month.  . Drug Use: No  . Sexual Activity: No   Other Topics Concern  . Not on file   Social History Narrative   Ailene Ards, who has schizophrenia, MR, is father of daughter.  Homero Fellers and pt are no longer together.  Homero Fellers does not see [name]. Daughter is 23 yo, Stage manager, lives with pt.        [name] [phone]), lives with pt's parents.      Needs assistance with ADL's and IADL's--has personal care services 20 hrs/wk;       No tobacco, EtOH, drugs.      **Patient very concerned about her kids being taken from her.      **Case Manager: Jack Quarto [phone]      Patient lives [place]     Allergies  Allergen Reactions  . Morphine Dermatitis and Other (See Comments)    Skin turned red    Review of Systems - General ROS: positive for  - chills and fever Hematological and Lymphatic ROS: negative Respiratory ROS: no cough, shortness of breath, or wheezing Cardiovascular ROS: no chest pain or dyspnea on exertion Gastrointestinal ROS: no abdominal pain, change in bowel habits, or black or bloody stools Genito-Urinary ROS: positive for - vaginal bleeding Neurological ROS: no TIA or stroke symptoms  Exam Filed Vitals:   09/24/15 2202  BP: 97/54  Pulse:   Temp: 100.5 F (38.1 C)  Resp:  20   O2 sats 100% on room air  Physical Examination: General appearance - in mild to moderate distress and chronically ill appearing Mental status - alert, oriented to person, place, and time, affect appropriate to mood Neck - supple, no significant adenopathy Chest - normal effort Heart - tachy S1 and S2 Abdomen - soft, nontender, nondistended, no masses or organomegaly Pelvic - there is a non-healed surgical wound with minimal active bleeding.  Cervix and uterus are surgically absent. Vaginal cuff is intact and is not bleeding at all. All bleeding is from surgical wound touched with AgNO3, and bleeding stable. Vagina packed with Kerlix at introitus Neurological - spastic extremeties Extremities - pedal edema 2 + Skin - pale  Labs:  CBC    Component Value Date/Time   WBC 15.5* 09/24/2015 1948   RBC 3.05* 09/24/2015 1948   HGB 8.1* 09/24/2015 1948   HGB 12.6 05/03/2011 1615   HCT 26.0* 09/24/2015 1948   HCT 32 03/30/2015   PLT 333 09/24/2015 1948   PLT 304 03/30/2015   MCV 85.2 09/24/2015 1948   MCV 80.4 03/30/2015   MCH 26.6 09/24/2015 1948   MCHC 31.2 09/24/2015 1948   RDW 16.7* 09/24/2015 1948   RDW 16.9 03/30/2015  LYMPHSABS 1.5 09/24/2015 1418   MONOABS 1.0 09/24/2015 1418   EOSABS 0.1 09/24/2015 1418   BASOSABS 0.0 09/24/2015 1418    CMP     Component Value Date/Time   NA 136 09/24/2015 1845   NA 139 03/30/2015 2149   K 4.1 09/24/2015 1845   CL 104 09/24/2015 1845   CO2 23 09/24/2015 1418   GLUCOSE 100* 09/24/2015 1845   BUN 7 09/24/2015 1845   BUN 16 03/30/2015 2149   CREATININE 0.40* 09/24/2015 1845   CREATININE 0.5 03/30/2015 2149   CREATININE 0.75 05/10/2011 1218   CALCIUM 8.7* 09/24/2015 1418   PROT 5.5* 06/11/2015 0255   ALBUMIN 2.8* 06/11/2015 0255   AST 13* 06/11/2015 0255   ALT 10* 06/11/2015 0255   ALKPHOS 58 06/11/2015 0255   BILITOT 0.4 06/11/2015 0255   GFRNONAA >60 09/24/2015 1418   GFRAA >60 09/24/2015 1418    Lab Results   Component Value Date   TSH 0.710 06/12/2015    Urinalysis    Component Value Date/Time   COLORURINE YELLOW 09/24/2015 1518   APPEARANCEUR TURBID* 09/24/2015 1518   LABSPEC 1.018 09/24/2015 1518   PHURINE 6.5 09/24/2015 1518   GLUCOSEU NEGATIVE 09/24/2015 1518   HGBUR LARGE* 09/24/2015 1518   HGBUR large 01/26/2014   HGBUR large 10/03/2010 1530   BILIRUBINUR NEGATIVE 09/24/2015 1518   BILIRUBINUR neg 03/15/2011 1719   KETONESUR NEGATIVE 09/24/2015 1518   PROTEINUR 30* 09/24/2015 1518   PROTEINUR 100 01/26/2014   PROTEINUR neg 03/15/2011 1719   UROBILINOGEN 1.0 09/24/2015 1518   UROBILINOGEN 0.2 03/15/2011 1719   NITRITE POSITIVE* 09/24/2015 1518   NITRITE neg 03/15/2011 1719   LEUKOCYTESUR MODERATE* 09/24/2015 1518   LEUKOCYTESUR mod 01/26/2014      Lab Results  Component Value Date   PREGTESTUR  10/19/2010    NEGATIVE        THE SENSITIVITY OF THIS METHODOLOGY IS >24 mIU/mL    Radiological Studies Ct Angio Chest Pe W/cm &/or Wo Cm  09/24/2015  CLINICAL DATA:  Dyspnea, tachycardia. EXAM: CT ANGIOGRAPHY CHEST WITH CONTRAST TECHNIQUE: Multidetector CT imaging of the chest was performed using the standard protocol during bolus administration of intravenous contrast. Multiplanar CT image reconstructions and MIPs were obtained to evaluate the vascular anatomy. CONTRAST:  50mL OMNIPAQUE IOHEXOL 350 MG/ML SOLN COMPARISON:  CT scan of October 25, 2014. FINDINGS: No pneumothorax or pleural effusion is noted. No acute pulmonary disease is noted. There is no evidence of pulmonary embolus. There is no evidence of thoracic aortic aneurysm or dissection. Enlarged left thyroid lobe is noted with low density. Gallstones are noted in the visualized portion of the upper abdomen. No mediastinal mass or adenopathy is noted. No significant osseous abnormality is noted. Review of the MIP images confirms the above findings. IMPRESSION: No definite evidence of pulmonary embolus. Cholelithiasis is  noted. Probable left thyroid mass or nodule is noted. Thyroid ultrasound is recommended for further evaluation. Electronically Signed   By: Lupita Raider, M.D.   On: 09/24/2015 16:52   Ct Abdomen Pelvis W Contrast  09/24/2015  CLINICAL DATA:  Vaginal bleeding. The patient had surgery for bladder cancer 4 weeks ago. Foley catheter in place. EXAM: CT ABDOMEN AND PELVIS WITH CONTRAST TECHNIQUE: Multidetector CT imaging of the abdomen and pelvis was performed using the standard protocol following bolus administration of intravenous contrast. CONTRAST:  OMNIPAQUE IOHEXOL 300 MG/ML  SOLN COMPARISON:  CT scan dated 10/04/2014 FINDINGS: Lower chest:  Normal. Hepatobiliary: There are multiple  stones in the gallbladder. Liver parenchyma is normal. No dilated intrahepatic bile ducts. Common bile duct is slightly prominent at 6.3 mm. Pancreas: Normal. Spleen: Normal. Adrenals/Urinary Tract: Adrenal glands are normal. Right kidney is normal. Stable 2 cm cyst in the mid left kidney. There is a suprapubic a Foley catheter in place. There is only a small amount of urine in the bladder. The wall of the bladder is markedly thickened. The patient has developed an inhomogeneous fluid collection distending the vagina. The uterus has been removed. Ovaries are normal. There is also a fluid collection which appears to be associated with the patient's urethra just anterior to the compressed and lower portion of the vagina. Stomach/Bowel: There is a large amount of stool in the rectosigmoid portion of the colon. The bowel is otherwise normal. Vascular/Lymphatic: Normal. Reproductive: Ovaries are normal. Uterus has been removed. The superior aspect of the vagina appears to be distended with inhomogeneous fluid, possibly blood. Other: No free air or free fluid. Musculoskeletal: No acute osseous abnormality. Spinal cord stimulator in place. IMPRESSION: The patient has developed 2 fluid collections posterior to the bladder, 1 appears  to be in the distended upper portion of the vagina in the lower fluid collection is either septated in the low lower portion of the vagina or is in a markedly distended urethra. Electronically Signed   By: Francene Boyers M.D.   On: 09/24/2015 16:52    Thank you so much for allowing [place] to participate in the care of this patient.  We will continue to follow with you. Please call the attending OB/GYN physician with questions or concerns at [phone].

## 2015-09-24 NOTE — ED Notes (Signed)
Notified [facility] for transportation to [facility] 

## 2015-09-24 NOTE — ED Notes (Addendum)
[facility]. [facility]. After confirming with patient RN re-called and [facility] then told patient was indeed patient. Staff states on Friday patient was seen by provider for vaginal bleeding and told to wipe with warm wash cloth and try to keep the area clean. Unknown which provider was seen and if patient had follow-up for suprapubic catheter. Last set of vitals on pt from facility HR 76, BP 149/86

## 2015-09-24 NOTE — ED Notes (Signed)
Per [facility] pt has foley catheter and had surgery for bladder cancer 4 weeks ago and has had spotting since then. Patient has noted increased bleeding in diapers since then. Patient denies pain.

## 2015-09-25 DIAGNOSIS — D638 Anemia in other chronic diseases classified elsewhere: Secondary | ICD-10-CM | POA: Diagnosis present

## 2015-09-25 DIAGNOSIS — I959 Hypotension, unspecified: Secondary | ICD-10-CM | POA: Diagnosis not present

## 2015-09-25 DIAGNOSIS — N318 Other neuromuscular dysfunction of bladder: Secondary | ICD-10-CM | POA: Diagnosis not present

## 2015-09-25 DIAGNOSIS — Z7901 Long term (current) use of anticoagulants: Secondary | ICD-10-CM | POA: Diagnosis not present

## 2015-09-25 DIAGNOSIS — G894 Chronic pain syndrome: Secondary | ICD-10-CM | POA: Diagnosis present

## 2015-09-25 DIAGNOSIS — F419 Anxiety disorder, unspecified: Secondary | ICD-10-CM | POA: Diagnosis present

## 2015-09-25 DIAGNOSIS — N939 Abnormal uterine and vaginal bleeding, unspecified: Secondary | ICD-10-CM | POA: Diagnosis present

## 2015-09-25 DIAGNOSIS — Z9889 Other specified postprocedural states: Secondary | ICD-10-CM | POA: Diagnosis not present

## 2015-09-25 DIAGNOSIS — Z86711 Personal history of pulmonary embolism: Secondary | ICD-10-CM | POA: Diagnosis not present

## 2015-09-25 DIAGNOSIS — N319 Neuromuscular dysfunction of bladder, unspecified: Secondary | ICD-10-CM | POA: Diagnosis not present

## 2015-09-25 DIAGNOSIS — N938 Other specified abnormal uterine and vaginal bleeding: Secondary | ICD-10-CM | POA: Diagnosis not present

## 2015-09-25 DIAGNOSIS — F429 Obsessive-compulsive disorder, unspecified: Secondary | ICD-10-CM | POA: Diagnosis present

## 2015-09-25 DIAGNOSIS — Z833 Family history of diabetes mellitus: Secondary | ICD-10-CM | POA: Diagnosis not present

## 2015-09-25 DIAGNOSIS — Z803 Family history of malignant neoplasm of breast: Secondary | ICD-10-CM | POA: Diagnosis not present

## 2015-09-25 DIAGNOSIS — G809 Cerebral palsy, unspecified: Secondary | ICD-10-CM | POA: Diagnosis not present

## 2015-09-25 DIAGNOSIS — Z86718 Personal history of other venous thrombosis and embolism: Secondary | ICD-10-CM | POA: Diagnosis not present

## 2015-09-25 DIAGNOSIS — K219 Gastro-esophageal reflux disease without esophagitis: Secondary | ICD-10-CM | POA: Diagnosis present

## 2015-09-25 DIAGNOSIS — L821 Other seborrheic keratosis: Secondary | ICD-10-CM | POA: Diagnosis present

## 2015-09-25 DIAGNOSIS — Z7289 Other problems related to lifestyle: Secondary | ICD-10-CM | POA: Diagnosis not present

## 2015-09-25 DIAGNOSIS — I10 Essential (primary) hypertension: Secondary | ICD-10-CM | POA: Diagnosis present

## 2015-09-25 DIAGNOSIS — G800 Spastic quadriplegic cerebral palsy: Secondary | ICD-10-CM | POA: Diagnosis not present

## 2015-09-25 DIAGNOSIS — Z01818 Encounter for other preprocedural examination: Secondary | ICD-10-CM | POA: Diagnosis not present

## 2015-09-25 DIAGNOSIS — M1990 Unspecified osteoarthritis, unspecified site: Secondary | ICD-10-CM | POA: Diagnosis present

## 2015-09-25 DIAGNOSIS — Z993 Dependence on wheelchair: Secondary | ICD-10-CM | POA: Diagnosis not present

## 2015-09-25 DIAGNOSIS — Z7409 Other reduced mobility: Secondary | ICD-10-CM | POA: Diagnosis not present

## 2015-09-25 DIAGNOSIS — Z975 Presence of (intrauterine) contraceptive device: Secondary | ICD-10-CM | POA: Diagnosis not present

## 2015-09-25 DIAGNOSIS — D6489 Other specified anemias: Secondary | ICD-10-CM | POA: Diagnosis not present

## 2015-09-25 DIAGNOSIS — R310 Gross hematuria: Secondary | ICD-10-CM | POA: Diagnosis present

## 2015-09-25 DIAGNOSIS — D62 Acute posthemorrhagic anemia: Secondary | ICD-10-CM | POA: Diagnosis not present

## 2015-09-25 DIAGNOSIS — Z800 Family history of malignant neoplasm of digestive organs: Secondary | ICD-10-CM | POA: Diagnosis not present

## 2015-09-25 DIAGNOSIS — A419 Sepsis, unspecified organism: Secondary | ICD-10-CM | POA: Diagnosis not present

## 2015-09-25 DIAGNOSIS — G8389 Other specified paralytic syndromes: Secondary | ICD-10-CM | POA: Diagnosis not present

## 2015-09-25 DIAGNOSIS — M2450 Contracture, unspecified joint: Secondary | ICD-10-CM | POA: Diagnosis present

## 2015-09-25 LAB — TYPE AND SCREEN
ABO/RH(D): A POS
ANTIBODY SCREEN: NEGATIVE
UNIT DIVISION: 0
Unit division: 0

## 2015-09-25 NOTE — ED Provider Notes (Signed)
Vaginal packing Date/Time: 09/24/2015 9:05 PM Performed by: Gavin PoundBROOTEN, Duward Allbritton Authorized by: Gavin PoundBROOTEN, Haidy Kackley Consent: The procedure was performed in an emergent situation. Verbal consent obtained. Consent given by: patient Required items: required blood products, implants, devices, and special equipment available Patient identity confirmed: arm band Patient tolerance: Patient tolerated the procedure well with no immediate complications Comments: Kerlix and surgicel applied via speculum and ring forceps for hemostasis.  Ultrasound Guided IV Date/Time: 09/24/2015 8:28 PM Performed by: Gavin PoundBROOTEN, Kaidyn Javid Authorized by: Gavin PoundBROOTEN, Liz Pinho Consent: The procedure was performed in an emergent situation. Verbal consent obtained. Consent given by: patient Required items: required blood products, implants, devices, and special equipment available Patient identity confirmed: arm band Patient tolerance: Patient tolerated the procedure well with no immediate complications Comments: 14ga US guided IV to Left EJ due to hemodynamic instability with inadequate IV access able to be obtained.   Return of non-pulsatile dark red blood with free flow of IV fluid.       Gavin PoundJustin Trayveon Beckford, MD 09/25/15 [id number]  Nelva Nayobert Beaton, MD 09/28/15 [phone]

## 2015-09-27 LAB — URINE CULTURE: Culture: >=100000

## 2015-09-28 ENCOUNTER — Telehealth (HOSPITAL_COMMUNITY): Payer: Self-pay

## 2015-09-28 DIAGNOSIS — Z95828 Presence of other vascular implants and grafts: Secondary | ICD-10-CM | POA: Insufficient documentation

## 2015-09-28 NOTE — Progress Notes (Signed)
ED Antimicrobial Stewardship Positive Culture Follow Up   [name] [name] is an 39 y.o. female who [facility] with a chief complaint of  Chief Complaint  Patient presents with  . Vaginal Bleeding    Recent Results (from the past 720 hour(s))  Urine culture     Status: None   Collection Time: 09/24/15  3:18 PM  Result Value Ref Range Status   Specimen Description URINE, RANDOM  Final   Special Requests NONE  Final   Culture   Final    >=100,000 COLONIES/mL ESCHERICHIA COLI Confirmed Extended Spectrum Beta-Lactamase Producer (ESBL)    Report Status 09/27/2015 FINAL  Final   Organism ID, Bacteria ESCHERICHIA COLI  Final      Susceptibility   Escherichia coli - MIC*    AMPICILLIN >=32 RESISTANT Resistant     CEFAZOLIN >=64 RESISTANT Resistant     CEFTRIAXONE >=64 RESISTANT Resistant     CIPROFLOXACIN >=4 RESISTANT Resistant     GENTAMICIN 2 SENSITIVE Sensitive     IMIPENEM <=0.25 SENSITIVE Sensitive     NITROFURANTOIN 128 RESISTANT Resistant     TRIMETH/SULFA >=320 RESISTANT Resistant     AMPICILLIN/SULBACTAM 16 INTERMEDIATE Intermediate     PIP/TAZO <=4 SENSITIVE Sensitive     * >=100,000 COLONIES/mL ESCHERICHIA COLI   Assessment: 39 yoF CC vaginal discharge. [facility] UCx now positive. [facility] and the [facility]ts urologist.   [name] [name], PharmD Clinical Pharmacist- Resident Pager: [phone]  [name] [name] 09/28/2015, 9:25 AM Infectious Diseases Pharmacist Phone# [phone]

## 2015-10-02 ENCOUNTER — Telehealth: Payer: Self-pay | Admitting: Neurology

## 2015-10-02 NOTE — Telephone Encounter (Signed)
[facility] to [facility] not back yet because of patient's insurance.

## 2015-10-02 NOTE — Telephone Encounter (Signed)
Called and spoke to [name] and she is going to call speciality pharmacy and call me back . I will then have Botox Shipped here.

## 2015-10-04 NOTE — Telephone Encounter (Signed)
Jackie/[facility] [phone] called regarding Botox Rx. Prescription that was recently changed from 300 units to 400 units did not indicate where in the body the Botox needs to go, previously it was intra muscularly, bilateral upper extremities every 3 months. Please call to advise.

## 2015-10-04 NOTE — Telephone Encounter (Signed)
She will still be injecting bilateral, upper extremities.

## 2015-10-04 NOTE — Telephone Encounter (Signed)
Patient called and requested the number to [facility] I relayed the information to her.

## 2015-10-08 ENCOUNTER — Emergency Department (HOSPITAL_COMMUNITY): Payer: Medicare Other

## 2015-10-08 ENCOUNTER — Encounter (HOSPITAL_COMMUNITY): Payer: Self-pay | Admitting: Emergency Medicine

## 2015-10-08 ENCOUNTER — Inpatient Hospital Stay (HOSPITAL_COMMUNITY)
Admission: EM | Admit: 2015-10-08 | Discharge: 2015-10-30 | DRG: 987 | Disposition: A | Discharge status: Skilled Nursing Facility | Payer: Medicare Other | Attending: Family Medicine | Admitting: Family Medicine

## 2015-10-08 ENCOUNTER — Telehealth: Payer: Self-pay | Admitting: Family Medicine

## 2015-10-08 DIAGNOSIS — G809 Cerebral palsy, unspecified: Secondary | ICD-10-CM | POA: Diagnosis present

## 2015-10-08 DIAGNOSIS — R10811 Right upper quadrant abdominal tenderness: Secondary | ICD-10-CM | POA: Diagnosis not present

## 2015-10-08 DIAGNOSIS — N12 Tubulo-interstitial nephritis, not specified as acute or chronic: Secondary | ICD-10-CM | POA: Diagnosis present

## 2015-10-08 DIAGNOSIS — R10819 Abdominal tenderness, unspecified site: Secondary | ICD-10-CM | POA: Insufficient documentation

## 2015-10-08 DIAGNOSIS — N390 Urinary tract infection, site not specified: Secondary | ICD-10-CM | POA: Diagnosis not present

## 2015-10-08 DIAGNOSIS — T83518A Infection and inflammatory reaction due to other urinary catheter, initial encounter: Principal | ICD-10-CM | POA: Diagnosis present

## 2015-10-08 DIAGNOSIS — O85 Puerperal sepsis: Secondary | ICD-10-CM

## 2015-10-08 DIAGNOSIS — B379 Candidiasis, unspecified: Secondary | ICD-10-CM | POA: Diagnosis not present

## 2015-10-08 DIAGNOSIS — Z1612 Extended spectrum beta lactamase (ESBL) resistance: Secondary | ICD-10-CM | POA: Diagnosis present

## 2015-10-08 DIAGNOSIS — B9620 Unspecified Escherichia coli [E. coli] as the cause of diseases classified elsewhere: Secondary | ICD-10-CM | POA: Diagnosis present

## 2015-10-08 DIAGNOSIS — I10 Essential (primary) hypertension: Secondary | ICD-10-CM | POA: Diagnosis present

## 2015-10-08 DIAGNOSIS — T3695XA Adverse effect of unspecified systemic antibiotic, initial encounter: Secondary | ICD-10-CM | POA: Diagnosis not present

## 2015-10-08 DIAGNOSIS — Z993 Dependence on wheelchair: Secondary | ICD-10-CM

## 2015-10-08 DIAGNOSIS — R1011 Right upper quadrant pain: Secondary | ICD-10-CM | POA: Insufficient documentation

## 2015-10-08 DIAGNOSIS — K8000 Calculus of gallbladder with acute cholecystitis without obstruction: Secondary | ICD-10-CM | POA: Diagnosis present

## 2015-10-08 DIAGNOSIS — A0472 Enterocolitis due to Clostridium difficile, not specified as recurrent: Secondary | ICD-10-CM | POA: Insufficient documentation

## 2015-10-08 DIAGNOSIS — K5641 Fecal impaction: Secondary | ICD-10-CM | POA: Diagnosis present

## 2015-10-08 DIAGNOSIS — B9689 Other specified bacterial agents as the cause of diseases classified elsewhere: Secondary | ICD-10-CM | POA: Diagnosis present

## 2015-10-08 DIAGNOSIS — K812 Acute cholecystitis with chronic cholecystitis: Secondary | ICD-10-CM

## 2015-10-08 DIAGNOSIS — I2699 Other pulmonary embolism without acute cor pulmonale: Secondary | ICD-10-CM | POA: Diagnosis present

## 2015-10-08 DIAGNOSIS — R319 Hematuria, unspecified: Secondary | ICD-10-CM | POA: Diagnosis not present

## 2015-10-08 DIAGNOSIS — K219 Gastro-esophageal reflux disease without esophagitis: Secondary | ICD-10-CM | POA: Diagnosis present

## 2015-10-08 DIAGNOSIS — Z79899 Other long term (current) drug therapy: Secondary | ICD-10-CM

## 2015-10-08 DIAGNOSIS — Z90710 Acquired absence of both cervix and uterus: Secondary | ICD-10-CM

## 2015-10-08 DIAGNOSIS — D6489 Other specified anemias: Secondary | ICD-10-CM | POA: Insufficient documentation

## 2015-10-08 DIAGNOSIS — Z86711 Personal history of pulmonary embolism: Secondary | ICD-10-CM

## 2015-10-08 DIAGNOSIS — Z419 Encounter for procedure for purposes other than remedying health state, unspecified: Secondary | ICD-10-CM

## 2015-10-08 DIAGNOSIS — L8990 Pressure ulcer of unspecified site, unspecified stage: Secondary | ICD-10-CM | POA: Insufficient documentation

## 2015-10-08 DIAGNOSIS — D649 Anemia, unspecified: Secondary | ICD-10-CM | POA: Diagnosis present

## 2015-10-08 DIAGNOSIS — E876 Hypokalemia: Secondary | ICD-10-CM | POA: Diagnosis present

## 2015-10-08 DIAGNOSIS — F419 Anxiety disorder, unspecified: Secondary | ICD-10-CM | POA: Diagnosis present

## 2015-10-08 DIAGNOSIS — R52 Pain, unspecified: Secondary | ICD-10-CM

## 2015-10-08 DIAGNOSIS — A419 Sepsis, unspecified organism: Secondary | ICD-10-CM | POA: Diagnosis not present

## 2015-10-08 DIAGNOSIS — Y846 Urinary catheterization as the cause of abnormal reaction of the patient, or of later complication, without mention of misadventure at the time of the procedure: Secondary | ICD-10-CM | POA: Diagnosis present

## 2015-10-08 DIAGNOSIS — Z885 Allergy status to narcotic agent status: Secondary | ICD-10-CM

## 2015-10-08 DIAGNOSIS — B49 Unspecified mycosis: Secondary | ICD-10-CM | POA: Diagnosis not present

## 2015-10-08 DIAGNOSIS — Z9359 Other cystostomy status: Secondary | ICD-10-CM

## 2015-10-08 DIAGNOSIS — G801 Spastic diplegic cerebral palsy: Secondary | ICD-10-CM | POA: Diagnosis present

## 2015-10-08 DIAGNOSIS — R6521 Severe sepsis with septic shock: Secondary | ICD-10-CM | POA: Diagnosis not present

## 2015-10-08 DIAGNOSIS — R509 Fever, unspecified: Secondary | ICD-10-CM

## 2015-10-08 DIAGNOSIS — D509 Iron deficiency anemia, unspecified: Secondary | ICD-10-CM | POA: Diagnosis present

## 2015-10-08 DIAGNOSIS — K521 Toxic gastroenteritis and colitis: Secondary | ICD-10-CM | POA: Diagnosis not present

## 2015-10-08 DIAGNOSIS — A047 Enterocolitis due to Clostridium difficile: Secondary | ICD-10-CM | POA: Diagnosis not present

## 2015-10-08 DIAGNOSIS — R579 Shock, unspecified: Secondary | ICD-10-CM | POA: Diagnosis present

## 2015-10-08 DIAGNOSIS — N3091 Cystitis, unspecified with hematuria: Secondary | ICD-10-CM | POA: Diagnosis present

## 2015-10-08 DIAGNOSIS — K7200 Acute and subacute hepatic failure without coma: Secondary | ICD-10-CM | POA: Diagnosis present

## 2015-10-08 DIAGNOSIS — Z9689 Presence of other specified functional implants: Secondary | ICD-10-CM | POA: Diagnosis present

## 2015-10-08 DIAGNOSIS — F339 Major depressive disorder, recurrent, unspecified: Secondary | ICD-10-CM | POA: Diagnosis present

## 2015-10-08 DIAGNOSIS — L89101 Pressure ulcer of unspecified part of back, stage 1: Secondary | ICD-10-CM | POA: Diagnosis not present

## 2015-10-08 LAB — CBC WITH DIFFERENTIAL/PLATELET
BASOS PCT: 0 %
Basophils Absolute: 0 10*3/uL (ref 0.0–0.1)
EOS ABS: 0 10*3/uL (ref 0.0–0.7)
Eosinophils Relative: 0 %
HEMATOCRIT: 30.1 % — AB (ref 36.0–46.0)
HEMOGLOBIN: 9.5 g/dL — AB (ref 12.0–15.0)
LYMPHS ABS: 1.4 10*3/uL (ref 0.7–4.0)
Lymphocytes Relative: 15 %
MCH: 27.5 pg (ref 26.0–34.0)
MCHC: 31.6 g/dL (ref 30.0–36.0)
MCV: 87.2 fL (ref 78.0–100.0)
Monocytes Absolute: 1 10*3/uL (ref 0.1–1.0)
Monocytes Relative: 10 %
NEUTROS ABS: 7.2 10*3/uL (ref 1.7–7.7)
NEUTROS PCT: 75 %
Platelets: 379 10*3/uL (ref 150–400)
RBC: 3.45 MIL/uL — AB (ref 3.87–5.11)
RDW: 16 % — ABNORMAL HIGH (ref 11.5–15.5)
WBC: 9.8 10*3/uL (ref 4.0–10.5)

## 2015-10-08 LAB — I-STAT CG4 LACTIC ACID, ED: Lactic Acid, Venous: 0.74 mmol/L (ref 0.5–2.0)

## 2015-10-08 LAB — I-STAT TROPONIN, ED: TROPONIN I, POC: 0 ng/mL (ref 0.00–0.08)

## 2015-10-08 MED ORDER — ACETAMINOPHEN 325 MG PO TABS
650.0000 mg | ORAL_TABLET | Freq: Once | ORAL | Status: AC
Start: 2015-10-08 — End: 2015-10-08
  Administered 2015-10-08: 650 mg via ORAL
  Filled 2015-10-08: qty 2

## 2015-10-08 MED ORDER — SODIUM CHLORIDE 0.9 % IV BOLUS (SEPSIS)
1000.0000 mL | INTRAVENOUS | Status: AC
  Administered 2015-10-08 (×2): 1000 mL via INTRAVENOUS

## 2015-10-08 NOTE — ED Notes (Signed)
Pt's BP continuing to drop with SBP in the 80s; fluid bolus currently running; MD and PA made aware.

## 2015-10-08 NOTE — Telephone Encounter (Signed)
Family Medicine After hours phone call  Medical from nursing stating that patient has had significant nausea and some vomiting over the past 24 hours. Patient has a suprapubic catheter with foul-smelling urine. Recent temperature 103. States that other than temperature patient's vital signs are currently stable.  I advised nursing to initiate the process to move patient to the ED now. Patient will likely need blood cultures, urine cultures, antibiotics, and IV fluids. And I'll be happy to admit the patient if/when the staff in the ED deems it necessary.  Kathee DeltonIan D McKeag, MD,MS,  PGY2 10/08/2015 9:13 PM

## 2015-10-08 NOTE — ED Notes (Signed)
Pt from [facility], brought via PTAR. C/o fever and nausea. Per PTAR, pt's temp was 103 tympanic.

## 2015-10-08 NOTE — ED Provider Notes (Signed)
CSN: [id number]     Arrival date & time 10/08/15  2201 History   First MD Initiated Contact with Patient 10/08/15 2219     Chief Complaint  Patient presents with  . Fever     (Consider location/radiation/quality/duration/timing/severity/associated sxs/prior Treatment) HPI   [name] [name] is a 39 y.o. female with extensive PMH significant for cerebral palsy, HTN, PE, GERD,  PE, hx of recurrent UTIs with suprapubic catheter, and Sepsis, who presents with 2 days of nonbloody N/V and fever (103 tympanic per PTAR) from [facility].  On arrival she is diaphoretic, tachycardic, tacypneic, and febrile at 102.  She has tried phenergan.  Nothing makes it better or worse.  Assoc sxs include CP and dark urine.  Denies abdominal pain, SOB, diarrhea, vaginal discharge, HA, neck stiffness, or vaginal discharge.  No recent abx use.   Past Medical History  Diagnosis Date  . Cerebral palsy ([name])   . Pulmonary embolism ([name])     Lifetime Coumadin  . Cerebral palsy ([name])     Spastic Cerebral palsy, mentally intact  . Contracture, joint, multiple sites     Electric wheelchair, uses left hand to operate chair.   [name] Kitchen Hypertension   . OCD (obsessive compulsive disorder)   . Depression   . Migraine   . [name]   . Dysarthria   . Endometriosis   . History of recurrent UTIs   . GERD (gastroesophageal reflux disease)   . Pulmonary embolism ([name]) 2011, 01/2011    Will be on lifetime coumadin  . Anemia   . Abdominal pain 01/02/2012    Overview:  Overview:  Per Previous pcp note- Dr. Ta--Pt has history of endometriosis and had DUB in 2011.  She She had u/s that showed normal endometrial stripe.  She had endometrial bx that was wnl.  She has a lot of abd cramping every month.  This cramping was sometimes not relieved with hydrocodone.  She was referred to [facility] for IUD placement to help endometriosis and abd cramping.  [name]  . Macroscopic hematuria 03/10/2013    status post  replacement of suprapubic tube 03/04/2013   . HYPERTENSION, BENIGN 01/31/2010    Qualifier: Diagnosis of  By: [name], MD, [name] History of endometriosis 10/2012    OBGYN [facility]: Laparoscopic Endometrial Ablation, TVH,  . Gram positive sepsis ([name]) 10/24/2014  . HCAP (healthcare-associated pneumonia) 10/25/2014  . Abdominal pain 01/02/2012    Overview:  Overview:  Per Previous pcp note- Dr. Ta--Pt has history of endometriosis and had DUB in 2011.  She She had u/s that showed normal endometrial stripe.  She had endometrial bx that was wnl.  She has a lot of abd cramping every month.  This cramping was sometimes not relieved with hydrocodone.  She was referred to [facility] for IUD placement to help endometriosis and abd cramping.  [name] was placed 04/30/11 by [facility].  Pt still complains of abd cramping, which I am treating with hydrocodone and ultram. Pt reports some initial relief of pain from IUD, but states it has now returned back to similar to previous cramping. Gave patient a shot of Toradol 60 mg IM today-since positive exacerbation of abdominal cramping during past 2 weeks. Physical exam reassuring. Patient may need to return to [facility] for further workup since she is plugged in with the GYN providers there for further treatment options regarding endometriosis and abdominal cramping. The dysfunctional uterine bleeding now well contr  . Acute  respiratory failure with hypoxia ([name])   . Cellulitis 12/22/2014  . Disease of female genital organs 10/24/2010    Overview:  Overview:  Pt has history of endometriosis and had DUB in 2011.  She had u/s that showed normal endometrial stripe.  She had endometrial bx that was wnl.  She has a lot of abd cramping every month.  This cramping was sometimes not relieved with hydrocodone.  She was referred to [facility] for IUD placement to help endometriosis and abd cramping.  [name] was placed 04/30/11 by [facility].  Pt still complains of abd cramping,  which was treated with hydrocodone and ultram.   Last seen at [facility] by OB/GYN 12/24/11, given Depo Lupron injection.  Ordering CT scan to r/o additional cause of abdominal pain (pt with known endometriosis and will not tolerate vaginal U/S, pain not improved s/p [name] or with Lupron injection-- only caused hot flashes and amenorrhea, no pain relief).  Also made referral to pain clinic for better pain control.  Do not think hysterectomy would help with pain, and concern for contamination of baclofen pump if did proceed with surgery.    8/5/13[name]: Pt was seen by   . Dyspnea and respiratory abnormality 02/04/2011    Overview:  Overview:  Pt has history of recurrent PE and is on chronic coumadin.  Pt has been complaining of dyspnea that she describes as chest tightness.  She has been to the ER multiple times for this.  Usually she gets CTA and serial CE.  She has had at least 6 CTA in a period of since 2011.  She also has an O2 requirement. It is unclear why pt feels dyspneic, complains of chest tightness, or has O2 requirement.  I thought that her complaint may be from deconditioning and referred her to PT.  Unfortunately Medicaid only pays for 4 PT visits.  Pt should be doing these exercises at home, but she has not.  I have referred her to [facility], and she has been seen by [name].  He did not understand the etiology of her dyspnea and O2 requirement.  He will continue to follow her.   Last Assessment & Plan:  Pt presents with concerns for chest tightness and dyspnea.  She was seen in ED yesterday and discharged home.  While there she had a CT chest 04/20/11 showed 1.  Multifocal degradation as detailed above.  N  . Gross hematuria 03/12/2013    Overview:  Overview:  status post replacement of suprapubic tube 03/04/2013  Last Assessment & Plan:  Recent urine culture collected in ED on 5/4 did not result in significant growth. Moreover, patient has been asymptomatic with normal WBC, no fever and no true  symptoms making symptomatic UTI less likely.  Will continue to monitor for any sign of infection.  Patient's xarelto had been held during her recent hospitalization due to hematuria. This has since been re-started. Monitor bleeding.    [name] Kitchen History of anticoagulant therapy 10/21/2012  . Infantile cerebral palsy ([name]) 01/29/2007    Overview:  She has spastic CP.  She is mentally intact.  She is wheelchair bound.  She is wheelchair bound for ambulation      . Intracervical pessary 05/07/2011    Overview:  Overview:  Placed by [facility] 04/30/11 to treat DUB and Endometriosis.  She is seen at GYN clinic at [facility] but was considered a poor surgical candidate and referred her to [facility].  For DUB she had pelvic  ultrasound that showed thin stripe and she had endometrial Bx by Dr Jennette KettleNeal in GYN clinic, which was negative.     . Parapneumonic effusion 10/25/2014  . Presence of intrathecal baclofen pump 03/07/2014    R LQ. Insertion T10.    [name] Kitchen. Psychiatric illness 10/31/2014  . Recurrent pulmonary embolism ([name]) 05/03/2011    Pt has history of recurrent PE and is on chronic coumadin.  Pt has been complaining of dyspnea that she describes as chest tightness.  She has been to the ER multiple times for this.  Usually she gets CTA and serial CE.  She has had at least 6 CTA in a period of since 2011.  She also has an O2 requirement. It is unclear why pt feels dyspneic, complains of chest tightness, or has O2 requirement.  I thought that her complaint may be from deconditioning and referred her to PT.  Unfortunately Medicaid only pays for 4 PT visits.  Pt should be doing these exercises at home, but she has not.  [facility], and she has been seen by Dr Marchelle Gearingamaswamy.  He did not understand the etiology of her dyspnea and O2 requirement.  He will continue to follow her.     . Seborrheic keratosis 01/18/2011  . Spasticity 01/05/2014  . Transient alteration of awareness, recurrent 09/08/2006    Recurrent episodes where  [name]  loses contact with her world and that have been studied thoroughly and appear nonepileptic in nature per Dr Terrace ArabiaYan (neurology) The patient has had episodes starting in 2007 that initially began 1-4 times a day during which time she would have periods of unawareness followed by confusion. This continuous video EEG monitoring performed from October 8-12, 2007. Had   . Acute cystitis without hematuria   . Acute cystitis with hematuria   . Urethra dilated and patulous 07/03/2015    Patulous, Dilated Urethra. 5mL balloon on a 22-Fr catheter hoping this would prevent the bladder spasm from pushing the balloon down the urethra (Dr Logan BoresEvans, Urology [facility], July 2016)    Past Surgical History  Procedure Laterality Date  . Colposcopy  06/2000  . Cesarean section      x 2  . Tubal ligation  2003  . Carpal tunnel release  08/2008    Dr Teressa SenterSypher  . Wrist surgery  06/2010    Dr Dierdre [facility]lesLi[facility]  . Baclofen pump refill      x 3 times  . Appendectomy    . Intrauterine device insertion  04/30/11    [facility]  . Laparoscopically assisted vag hysterectomy  10/27/2012  . Multiple extractions with alveoloplasty Bilateral 07/19/2013    Procedure: EXTRACTIONS #4, 8,29,56,215,11,16,19;  Surgeon: Francene Findershristopher L Ralls, DDS;  [facility];  Service: Oral Surgery;  Laterality: Bilateral;  . Pain pump implantation N/A 03/07/2014    Procedure: baclofen pump revision/replacement and Catheter connection replacement;  Surgeon: Cristi LoronJeffrey D Jenkins, MD;  Location: [facility];  Service: Neurosurgery;  Laterality: N/A;  baclofen pump revision/replacement and Catheter connection replacement  . Programable baclofen pump revision  03/17/14    Battery Replacement    Family History  Problem Relation Age of Onset  . Asthma Father   . Colon cancer Maternal Grandmother     Died in her 3760's  . Cancer Paternal Grandmother   . Breast cancer Paternal Grandmother     Died in her 6770's  . Heart  attack Maternal Grandfather  Died in his 93's  . Alzheimer's disease Paternal Grandfather     Died in his 66's   Social History  Substance Use Topics  . Smoking status: Never Smoker   . Smokeless tobacco: Never Used  . Alcohol Use: 0.6 oz/week    1 Glasses of wine per week     Comment: Drinks alcohol once a month.   OB History    No data available     Review of Systems All other systems negative unless otherwise stated in HPI    Allergies  Morphine  Home Medications   Prior to Admission medications   Medication Sig Start Date End Date Taking? Authorizing Provider  acetaminophen (TYLENOL) 325 MG tablet Take 650 mg by mouth every 6 (six) hours as needed for mild pain.   Yes Historical Provider, MD  bacitracin-polymyxin b (POLYSPORIN) ointment Apply 1 g topically 2 (two) times daily. Apply around Suprapubic ostomy site   Yes Historical Provider, MD  baclofen (LIORESAL) 20 MG tablet Take 20 mg by mouth 4 (four) times daily.   Yes Historical Provider, MD  clonazePAM (KLONOPIN) 1 MG tablet Take 1 tablet (1 mg total) by mouth 4 (four) times daily. 09/20/15  Yes Kirt Boys, DO  Dimethicone (MOISTURE BARRIER EX) Apply 1 application topically as needed.   Yes Historical Provider, MD  docusate sodium (COLACE) 100 MG capsule Take 100 mg by mouth 2 (two) times daily.   Yes Historical Provider, MD  flavoxATE (URISPAS) 100 MG tablet Take 100 mg by mouth 3 (three) times daily as needed for bladder spasms.   Yes Historical Provider, MD  FLUoxetine (PROZAC) 10 MG capsule Take 10 mg by mouth daily.   Yes Historical Provider, MD  magnesium hydroxide (MILK OF MAGNESIA) 800 MG/5ML suspension Take 30 mLs by mouth daily as needed for constipation.   Yes Historical Provider, MD  Multiple Vitamin (MULTIVITAMIN) tablet Take 1 tablet by mouth daily.   Yes Historical Provider, MD  oxyCODONE (OXY IR/ROXICODONE) 5 MG immediate release tablet Take 5 mg by mouth every 4 (four) hours as needed for  moderate pain or severe pain (may give 2 tablets every 4 hours for severe pain).   Yes Historical Provider, MD  polyethylene glycol powder (GLYCOLAX/MIRALAX) powder Take 17 g by mouth 2 (two) times daily. 06/21/15  Yes Tyrone Nine, MD  QUEtiapine (SEROQUEL) 100 MG tablet Take 1 tablet (100 mg total) by mouth at bedtime. 06/21/15  Yes Tyrone Nine, MD  senna-docusate (SENOKOT-S) 8.6-50 MG per tablet Take 1 tablet by mouth 2 (two) times daily. Patient taking differently: Take 1 tablet by mouth daily.  01/02/15  Yes Stephanie Coup Street, MD  tiZANidine (ZANAFLEX) 4 MG tablet Take 1 tablet (4 mg total) by mouth 2 (two) times daily. Admin at 04:30 and qHS Patient taking differently: Take 4 mg by mouth 2 (two) times daily. Admin at 05:00 and QHS 06/28/15  Yes Tyrone Nine, MD  HYDROcodone-acetaminophen (NORCO/VICODIN) 5-325 MG per tablet Take 1 tablet by mouth every 6 (six) hours as needed (pain in both knees). Patient not taking: Reported on 10/08/2015 05/15/15   Leighton Roach McDiarmid, MD  ibuprofen (ADVIL,MOTRIN) 200 MG tablet Take 400 mg by mouth every 8 (eight) hours as needed for moderate pain.     Historical Provider, MD  SUMAtriptan (IMITREX) 100 MG tablet Take 100 mg by mouth every 2 (two) hours as needed for migraine or headache. May repeat in 2 hours if headache persists or recurs.  Historical Provider, MD   BP 91/47 mmHg  Pulse 84  Temp(Src) 98.6 F (37 C) (Oral)  Resp 28  Ht 5" (0.127 m)  Wt 128 lb (58.06 kg)  BMI 3599.73 kg/m2  SpO2 95%  LMP 04/05/2011 Physical Exam  Constitutional: She is oriented to person, place, and time. She appears well-developed and well-nourished. She has a sickly appearance. She appears ill.  HENT:  Head: Normocephalic and atraumatic.  Mouth/Throat: Oropharynx is clear and moist.  Eyes: Conjunctivae are normal. Pupils are equal, round, and reactive to light.  Neck: Normal range of motion. Neck supple.  Cardiovascular: Regular rhythm and normal heart sounds.   Tachycardia present.   No murmur heard. Pulmonary/Chest: Effort normal and breath sounds normal. No accessory muscle usage or stridor. Tachypnea noted. No respiratory distress. She has no wheezes. She has no rhonchi. She has no rales.  Abdominal: Soft. Bowel sounds are normal. She exhibits no distension. There is tenderness in the right upper quadrant, right lower quadrant, suprapubic area and left lower quadrant. There is no rigidity, no rebound and no guarding.    Musculoskeletal: Normal range of motion.  Lymphadenopathy:    She has no cervical adenopathy.  Neurological: She is alert and oriented to person, place, and time.  Skin: Skin is warm. She is diaphoretic.  Psychiatric: She has a normal mood and affect. Her behavior is normal.    ED Course  Procedures (including critical care time) Labs Review Labs Reviewed  COMPREHENSIVE METABOLIC PANEL - Abnormal; Notable for the following:    CO2 21 (*)    Glucose, Bld 105 (*)    Calcium 8.3 (*)    Albumin 3.1 (*)    AST 197 (*)    ALT 229 (*)    Alkaline Phosphatase 366 (*)    Total Bilirubin 1.9 (*)    All other components within normal limits  CBC WITH DIFFERENTIAL/PLATELET - Abnormal; Notable for the following:    RBC 3.45 (*)    Hemoglobin 9.5 (*)    HCT 30.1 (*)    RDW 16.0 (*)    All other components within normal limits  URINALYSIS, ROUTINE W REFLEX MICROSCOPIC (NOT AT [facility]) - Abnormal; Notable for the following:    Color, Urine AMBER (*)    APPearance CLOUDY (*)    Hgb urine dipstick SMALL (*)    Bilirubin Urine MODERATE (*)    Ketones, ur 15 (*)    Protein, ur 30 (*)    Nitrite POSITIVE (*)    Leukocytes, UA LARGE (*)    All other components within normal limits  URINE MICROSCOPIC-ADD ON - Abnormal; Notable for the following:    Bacteria, UA MANY (*)    Crystals CA OXALATE CRYSTALS (*)    All other components within normal limits  I-STAT CG4 LACTIC ACID, ED - Abnormal; Notable for the following:    Lactic Acid,  Venous 0.34 (*)    All other components within normal limits  CULTURE, BLOOD (ROUTINE X 2)  CULTURE, BLOOD (ROUTINE X 2)  URINE CULTURE  LIPASE, BLOOD  I-STAT CG4 LACTIC ACID, ED  I-STAT TROPOININ, ED   CRITICAL CARE Performed by: Cheri Fowler   Total critical care time: 60 minutes  Critical care time was exclusive of separately billable procedures and treating other patients.  Critical care was necessary to treat or prevent imminent or life-threatening deterioration.  Critical care was time spent personally by me on the following activities: development of treatment plan with patient and/or surrogate  as well as nursing, discussions with consultants, evaluation of patient's response to treatment, examination of patient, obtaining history from patient or surrogate, ordering and performing treatments and interventions, ordering and review of laboratory studies, ordering and review of radiographic studies, pulse oximetry and re-evaluation of patient's condition.   Imaging Review Dg Chest Port 1 View  10/08/2015  CLINICAL DATA:  Concern for sepsis. EXAM: PORTABLE CHEST 1 VIEW COMPARISON:  Chest CT 09/24/2015 FINDINGS: Chronically low lung volumes. There is no edema, consolidation, effusion, or pneumothorax. Normal heart size and mediastinal contours for technique. No acute osseous finding.  Lower thoracic spinal catheter noted. IMPRESSION: Negative for pneumonia. Electronically Signed   By: Marnee Spring M.D.   On: 10/08/2015 23:28   I have personally reviewed and evaluated these images and lab results as part of my medical decision-making.   EKG Interpretation   Date/Time:  Sunday October 08 2015 22:17:23 EST Ventricular Rate:  119 PR Interval:  115 QRS Duration: 82 QT Interval:  311 QTC Calculation: 437 R Axis:   77 Text Interpretation:  Sinus tachycardia Baseline wander in lead(s) I II  III aVR aVL aVF Confirmed by KNOTT MD, DANIEL ([id number]) on 10/08/2015  11:42:14 PM       MDM   Final diagnoses:  Septic shock ([name])  Urinary tract infection with hematuria, site unspecified   Patient presents with nonbloody N/V, CP, and fever x 2 days.  VS show tachycardia, tachypnea, no hypoxia, and BP 105/86.  She is diaphoretic.  Lungs CTAB, abdomen soft, tenderness in RLQ/suprapubic/LLQ.  Will start sepsis workup.  Will give tylenol.  Will give fluids per sepsis protocol.  EKG shows sinus tach.  Troponin 0.00 2315: Lactic acid 0.74.  Blood cultures pending.  CBC shows WBC 9.8.  Hgb 9.5, improved from 2 weeks ago (8.1)  CXR shows no evidence of PNA.   0018: UA: shows large leukocytes; microscopic 21-50 WBC and many bacteria,  positive nitrite.  Will give 1g IV rocephin.  Upon chart review, recent urine cultures show resistance to rocephin.  Susceptibilities to vanc and zosyn.  Will start vanc and zosyn. Will start an additional 2L of fluids.  Will order Levophed.  Will also obtain RUQ U/S given elevated AST 197, ALT 229 and RUQ tenderness.  0045: Will consult critical care.  Per critical care, will reassess after 2L.    0109: Patient's BP persistently hypotensive, with MAPs in the 40s despite 4L fluid resuscitation.  O2 sat 92%, RR 22.  Critical care to see her.    Case has been discussed with and seen by Dr. Elesa Massed who agrees with the above plan for admission.      Cheri Fowler, PA-C 10/09/15 [phone]

## 2015-10-08 NOTE — ED Notes (Signed)
ERRN attempted IV x 2 without success

## 2015-10-08 NOTE — ED Notes (Signed)
Phlebotomy and IV team at the bedside.

## 2015-10-09 ENCOUNTER — Inpatient Hospital Stay (HOSPITAL_COMMUNITY): Payer: Medicare Other

## 2015-10-09 ENCOUNTER — Emergency Department (HOSPITAL_COMMUNITY): Payer: Medicare Other

## 2015-10-09 DIAGNOSIS — D509 Iron deficiency anemia, unspecified: Secondary | ICD-10-CM | POA: Diagnosis present

## 2015-10-09 DIAGNOSIS — F419 Anxiety disorder, unspecified: Secondary | ICD-10-CM | POA: Diagnosis present

## 2015-10-09 DIAGNOSIS — B379 Candidiasis, unspecified: Secondary | ICD-10-CM | POA: Diagnosis not present

## 2015-10-09 DIAGNOSIS — R6521 Severe sepsis with septic shock: Secondary | ICD-10-CM | POA: Diagnosis not present

## 2015-10-09 DIAGNOSIS — K521 Toxic gastroenteritis and colitis: Secondary | ICD-10-CM | POA: Diagnosis not present

## 2015-10-09 DIAGNOSIS — G809 Cerebral palsy, unspecified: Secondary | ICD-10-CM | POA: Diagnosis present

## 2015-10-09 DIAGNOSIS — Z885 Allergy status to narcotic agent status: Secondary | ICD-10-CM | POA: Diagnosis not present

## 2015-10-09 DIAGNOSIS — D649 Anemia, unspecified: Secondary | ICD-10-CM | POA: Diagnosis present

## 2015-10-09 DIAGNOSIS — B9689 Other specified bacterial agents as the cause of diseases classified elsewhere: Secondary | ICD-10-CM | POA: Diagnosis present

## 2015-10-09 DIAGNOSIS — Z90710 Acquired absence of both cervix and uterus: Secondary | ICD-10-CM | POA: Diagnosis not present

## 2015-10-09 DIAGNOSIS — K810 Acute cholecystitis: Secondary | ICD-10-CM | POA: Diagnosis not present

## 2015-10-09 DIAGNOSIS — A047 Enterocolitis due to Clostridium difficile: Secondary | ICD-10-CM | POA: Diagnosis not present

## 2015-10-09 DIAGNOSIS — E0789 Other specified disorders of thyroid: Secondary | ICD-10-CM | POA: Insufficient documentation

## 2015-10-09 DIAGNOSIS — F339 Major depressive disorder, recurrent, unspecified: Secondary | ICD-10-CM | POA: Diagnosis present

## 2015-10-09 DIAGNOSIS — N12 Tubulo-interstitial nephritis, not specified as acute or chronic: Secondary | ICD-10-CM | POA: Diagnosis present

## 2015-10-09 DIAGNOSIS — L8990 Pressure ulcer of unspecified site, unspecified stage: Secondary | ICD-10-CM | POA: Diagnosis not present

## 2015-10-09 DIAGNOSIS — A419 Sepsis, unspecified organism: Secondary | ICD-10-CM | POA: Diagnosis not present

## 2015-10-09 DIAGNOSIS — N3091 Cystitis, unspecified with hematuria: Secondary | ICD-10-CM | POA: Diagnosis present

## 2015-10-09 DIAGNOSIS — K219 Gastro-esophageal reflux disease without esophagitis: Secondary | ICD-10-CM | POA: Diagnosis not present

## 2015-10-09 DIAGNOSIS — K8010 Calculus of gallbladder with chronic cholecystitis without obstruction: Secondary | ICD-10-CM | POA: Diagnosis not present

## 2015-10-09 DIAGNOSIS — I10 Essential (primary) hypertension: Secondary | ICD-10-CM | POA: Diagnosis present

## 2015-10-09 DIAGNOSIS — K8000 Calculus of gallbladder with acute cholecystitis without obstruction: Secondary | ICD-10-CM | POA: Diagnosis present

## 2015-10-09 DIAGNOSIS — G801 Spastic diplegic cerebral palsy: Secondary | ICD-10-CM | POA: Diagnosis not present

## 2015-10-09 DIAGNOSIS — K7200 Acute and subacute hepatic failure without coma: Secondary | ICD-10-CM | POA: Diagnosis not present

## 2015-10-09 DIAGNOSIS — R579 Shock, unspecified: Secondary | ICD-10-CM | POA: Diagnosis present

## 2015-10-09 DIAGNOSIS — Z79899 Other long term (current) drug therapy: Secondary | ICD-10-CM | POA: Diagnosis not present

## 2015-10-09 DIAGNOSIS — B9620 Unspecified Escherichia coli [E. coli] as the cause of diseases classified elsewhere: Secondary | ICD-10-CM | POA: Diagnosis not present

## 2015-10-09 DIAGNOSIS — E876 Hypokalemia: Secondary | ICD-10-CM | POA: Diagnosis present

## 2015-10-09 DIAGNOSIS — T83518D Infection and inflammatory reaction due to other urinary catheter, subsequent encounter: Secondary | ICD-10-CM | POA: Diagnosis not present

## 2015-10-09 DIAGNOSIS — R319 Hematuria, unspecified: Secondary | ICD-10-CM | POA: Diagnosis present

## 2015-10-09 DIAGNOSIS — R1011 Right upper quadrant pain: Secondary | ICD-10-CM | POA: Diagnosis not present

## 2015-10-09 DIAGNOSIS — Z86711 Personal history of pulmonary embolism: Secondary | ICD-10-CM | POA: Diagnosis not present

## 2015-10-09 DIAGNOSIS — R10811 Right upper quadrant abdominal tenderness: Secondary | ICD-10-CM | POA: Diagnosis not present

## 2015-10-09 DIAGNOSIS — L89101 Pressure ulcer of unspecified part of back, stage 1: Secondary | ICD-10-CM | POA: Diagnosis not present

## 2015-10-09 DIAGNOSIS — M7989 Other specified soft tissue disorders: Secondary | ICD-10-CM | POA: Diagnosis not present

## 2015-10-09 DIAGNOSIS — Y846 Urinary catheterization as the cause of abnormal reaction of the patient, or of later complication, without mention of misadventure at the time of the procedure: Secondary | ICD-10-CM | POA: Diagnosis present

## 2015-10-09 DIAGNOSIS — K812 Acute cholecystitis with chronic cholecystitis: Secondary | ICD-10-CM | POA: Diagnosis not present

## 2015-10-09 DIAGNOSIS — K5641 Fecal impaction: Secondary | ICD-10-CM | POA: Diagnosis present

## 2015-10-09 DIAGNOSIS — K8020 Calculus of gallbladder without cholecystitis without obstruction: Secondary | ICD-10-CM | POA: Diagnosis not present

## 2015-10-09 DIAGNOSIS — R1030 Lower abdominal pain, unspecified: Secondary | ICD-10-CM | POA: Diagnosis not present

## 2015-10-09 DIAGNOSIS — N390 Urinary tract infection, site not specified: Secondary | ICD-10-CM | POA: Diagnosis not present

## 2015-10-09 DIAGNOSIS — N10 Acute tubulo-interstitial nephritis: Secondary | ICD-10-CM | POA: Diagnosis not present

## 2015-10-09 DIAGNOSIS — Z452 Encounter for adjustment and management of vascular access device: Secondary | ICD-10-CM | POA: Diagnosis not present

## 2015-10-09 DIAGNOSIS — A4151 Sepsis due to Escherichia coli [E. coli]: Secondary | ICD-10-CM | POA: Diagnosis not present

## 2015-10-09 DIAGNOSIS — R7881 Bacteremia: Secondary | ICD-10-CM | POA: Diagnosis not present

## 2015-10-09 DIAGNOSIS — Z4682 Encounter for fitting and adjustment of non-vascular catheter: Secondary | ICD-10-CM | POA: Diagnosis not present

## 2015-10-09 DIAGNOSIS — T83518A Infection and inflammatory reaction due to other urinary catheter, initial encounter: Secondary | ICD-10-CM | POA: Diagnosis not present

## 2015-10-09 DIAGNOSIS — D6489 Other specified anemias: Secondary | ICD-10-CM | POA: Diagnosis not present

## 2015-10-09 DIAGNOSIS — Z9359 Other cystostomy status: Secondary | ICD-10-CM | POA: Diagnosis not present

## 2015-10-09 DIAGNOSIS — J9811 Atelectasis: Secondary | ICD-10-CM | POA: Diagnosis not present

## 2015-10-09 DIAGNOSIS — R509 Fever, unspecified: Secondary | ICD-10-CM | POA: Diagnosis not present

## 2015-10-09 DIAGNOSIS — Z1612 Extended spectrum beta lactamase (ESBL) resistance: Secondary | ICD-10-CM | POA: Diagnosis not present

## 2015-10-09 DIAGNOSIS — T3695XA Adverse effect of unspecified systemic antibiotic, initial encounter: Secondary | ICD-10-CM | POA: Diagnosis not present

## 2015-10-09 DIAGNOSIS — Z993 Dependence on wheelchair: Secondary | ICD-10-CM | POA: Diagnosis not present

## 2015-10-09 DIAGNOSIS — B49 Unspecified mycosis: Secondary | ICD-10-CM | POA: Diagnosis not present

## 2015-10-09 DIAGNOSIS — Z9689 Presence of other specified functional implants: Secondary | ICD-10-CM | POA: Diagnosis present

## 2015-10-09 LAB — GLUCOSE, CAPILLARY
Glucose-Capillary: 165 mg/dL — ABNORMAL HIGH (ref 65–99)
Glucose-Capillary: 91 mg/dL (ref 65–99)

## 2015-10-09 LAB — COMPREHENSIVE METABOLIC PANEL
ALT: 229 U/L — ABNORMAL HIGH (ref 14–54)
ANION GAP: 11 (ref 5–15)
AST: 197 U/L — ABNORMAL HIGH (ref 15–41)
Albumin: 3.1 g/dL — ABNORMAL LOW (ref 3.5–5.0)
Alkaline Phosphatase: 366 U/L — ABNORMAL HIGH (ref 38–126)
BILIRUBIN TOTAL: 1.9 mg/dL — AB (ref 0.3–1.2)
BUN: 8 mg/dL (ref 6–20)
CHLORIDE: 103 mmol/L (ref 101–111)
CO2: 21 mmol/L — ABNORMAL LOW (ref 22–32)
Calcium: 8.3 mg/dL — ABNORMAL LOW (ref 8.9–10.3)
Creatinine, Ser: 0.56 mg/dL (ref 0.44–1.00)
Glucose, Bld: 105 mg/dL — ABNORMAL HIGH (ref 65–99)
POTASSIUM: 3.5 mmol/L (ref 3.5–5.1)
Sodium: 135 mmol/L (ref 135–145)
TOTAL PROTEIN: 6.5 g/dL (ref 6.5–8.1)

## 2015-10-09 LAB — PROTIME-INR
INR: 1.41 (ref 0.00–1.49)
INR: 1.41 (ref 0.00–1.49)
PROTHROMBIN TIME: 17.4 s — AB (ref 11.6–15.2)
PROTHROMBIN TIME: 17.4 s — AB (ref 11.6–15.2)
Prothrombin Time: 18.6 seconds — ABNORMAL HIGH (ref 11.6–15.2)

## 2015-10-09 LAB — CBC
HCT: 27.1 % — ABNORMAL LOW (ref 36.0–46.0)
Hemoglobin: 8.3 g/dL — ABNORMAL LOW (ref 12.0–15.0)
MCH: 27.3 pg (ref 26.0–34.0)
MCHC: 30.6 g/dL (ref 30.0–36.0)
MCV: 89.1 fL (ref 78.0–100.0)
PLATELETS: 388 10*3/uL (ref 150–400)
RBC: 3.04 MIL/uL — AB (ref 3.87–5.11)
RDW: 16.2 % — AB (ref 11.5–15.5)
WBC: 7.8 10*3/uL (ref 4.0–10.5)

## 2015-10-09 LAB — URINE MICROSCOPIC-ADD ON

## 2015-10-09 LAB — URINALYSIS, ROUTINE W REFLEX MICROSCOPIC
Glucose, UA: NEGATIVE mg/dL
Ketones, ur: 15 mg/dL — AB
NITRITE: POSITIVE — AB
Protein, ur: 30 mg/dL — AB
SPECIFIC GRAVITY, URINE: 1.015 (ref 1.005–1.030)
UROBILINOGEN UA: 1 mg/dL (ref 0.0–1.0)
pH: 6 (ref 5.0–8.0)

## 2015-10-09 LAB — APTT
APTT: 20 s — AB (ref 24–37)
aPTT: 33 seconds (ref 24–37)

## 2015-10-09 LAB — CORTISOL: Cortisol, Plasma: 10.7 ug/dL

## 2015-10-09 LAB — MAGNESIUM
MAGNESIUM: 1.6 mg/dL — AB (ref 1.7–2.4)
Magnesium: 2.7 mg/dL — ABNORMAL HIGH (ref 1.7–2.4)

## 2015-10-09 LAB — TYPE AND SCREEN
ABO/RH(D): A POS
ANTIBODY SCREEN: NEGATIVE

## 2015-10-09 LAB — I-STAT CG4 LACTIC ACID, ED: LACTIC ACID, VENOUS: 0.34 mmol/L — AB (ref 0.5–2.0)

## 2015-10-09 LAB — MRSA PCR SCREENING: MRSA by PCR: NEGATIVE

## 2015-10-09 LAB — LACTIC ACID, PLASMA
Lactic Acid, Venous: 0.6 mmol/L (ref 0.5–2.0)
Lactic Acid, Venous: 0.6 mmol/L (ref 0.5–2.0)

## 2015-10-09 LAB — STREP PNEUMONIAE URINARY ANTIGEN: Strep Pneumo Urinary Antigen: NEGATIVE

## 2015-10-09 LAB — PROCALCITONIN: Procalcitonin: 0.25 ng/mL

## 2015-10-09 LAB — LIPASE, BLOOD: LIPASE: 18 U/L (ref 11–51)

## 2015-10-09 LAB — PHOSPHORUS: PHOSPHORUS: 2.7 mg/dL (ref 2.5–4.6)

## 2015-10-09 MED ORDER — VANCOMYCIN HCL IN DEXTROSE 750-5 MG/150ML-% IV SOLN
750.0000 mg | Freq: Two times a day (BID) | INTRAVENOUS | Status: DC
  Administered 2015-10-09 – 2015-10-11 (×4): 750 mg via INTRAVENOUS
  Filled 2015-10-09 (×6): qty 150

## 2015-10-09 MED ORDER — PIPERACILLIN-TAZOBACTAM 3.375 G IVPB 30 MIN
3.3750 g | Freq: Once | INTRAVENOUS | Status: AC
  Administered 2015-10-09: 3.375 g via INTRAVENOUS
  Filled 2015-10-09: qty 50

## 2015-10-09 MED ORDER — CETYLPYRIDINIUM CHLORIDE 0.05 % MT LIQD: 7.0000 mL | Freq: Two times a day (BID) | OROMUCOSAL | Status: DC

## 2015-10-09 MED ORDER — VANCOMYCIN HCL 500 MG IV SOLR
500.0000 mg | Freq: Three times a day (TID) | INTRAVENOUS | Status: DC
  Administered 2015-10-09: 500 mg via INTRAVENOUS
  Filled 2015-10-09 (×2): qty 500

## 2015-10-09 MED ORDER — VANCOMYCIN HCL IN DEXTROSE 1-5 GM/200ML-% IV SOLN
1000.0000 mg | Freq: Once | INTRAVENOUS | Status: AC
  Administered 2015-10-09: 1000 mg via INTRAVENOUS
  Filled 2015-10-09: qty 200

## 2015-10-09 MED ORDER — MAGNESIUM SULFATE 2 GM/50ML IV SOLN
2.0000 g | Freq: Once | INTRAVENOUS | Status: AC
  Administered 2015-10-09: 2 g via INTRAVENOUS
  Filled 2015-10-09: qty 50

## 2015-10-09 MED ORDER — HEPARIN SODIUM (PORCINE) 5000 UNIT/ML IJ SOLN
5000.0000 [IU] | Freq: Three times a day (TID) | INTRAMUSCULAR | Status: DC
  Administered 2015-10-09 – 2015-10-12 (×10): 5000 [IU] via SUBCUTANEOUS
  Filled 2015-10-09 (×10): qty 1

## 2015-10-09 MED ORDER — PANTOPRAZOLE SODIUM 40 MG PO TBEC
40.0000 mg | DELAYED_RELEASE_TABLET | Freq: Every day | ORAL | Status: DC
  Administered 2015-10-09 – 2015-10-24 (×16): 40 mg via ORAL
  Filled 2015-10-09 (×17): qty 1

## 2015-10-09 MED ORDER — SODIUM CHLORIDE 0.9 % IV SOLN
250.0000 mg | Freq: Four times a day (QID) | INTRAVENOUS | Status: AC
  Administered 2015-10-09 – 2015-10-13 (×15): 250 mg via INTRAVENOUS
  Filled 2015-10-09 (×18): qty 250

## 2015-10-09 MED ORDER — PANTOPRAZOLE SODIUM 40 MG IV SOLR
40.0000 mg | Freq: Every day | INTRAVENOUS | Status: DC
  Filled 2015-10-09: qty 40

## 2015-10-09 MED ORDER — QUETIAPINE FUMARATE 50 MG PO TABS
100.0000 mg | ORAL_TABLET | Freq: Every day | ORAL | Status: DC
  Administered 2015-10-09 – 2015-10-29 (×21): 100 mg via ORAL
  Filled 2015-10-09 (×6): qty 2
  Filled 2015-10-09: qty 1
  Filled 2015-10-09 (×16): qty 2

## 2015-10-09 MED ORDER — POTASSIUM CHLORIDE CRYS ER 20 MEQ PO TBCR
20.0000 meq | EXTENDED_RELEASE_TABLET | Freq: Two times a day (BID) | ORAL | Status: AC
Start: 2015-10-09 — End: 2015-10-09
  Administered 2015-10-09 (×2): 20 meq via ORAL
  Filled 2015-10-09 (×2): qty 1

## 2015-10-09 MED ORDER — VANCOMYCIN HCL 500 MG IV SOLR
500.0000 mg | Freq: Three times a day (TID) | INTRAVENOUS | Status: DC
  Filled 2015-10-09: qty 500

## 2015-10-09 MED ORDER — SODIUM CHLORIDE 0.9 % IV SOLN
1000.0000 mL | Freq: Once | INTRAVENOUS | Status: AC
  Administered 2015-10-09: 1000 mL via INTRAVENOUS

## 2015-10-09 MED ORDER — NOREPINEPHRINE BITARTRATE 1 MG/ML IV SOLN
5.0000 ug/min | INTRAVENOUS | Status: DC
  Filled 2015-10-09: qty 4

## 2015-10-09 MED ORDER — PIPERACILLIN-TAZOBACTAM 3.375 G IVPB
3.3750 g | Freq: Three times a day (TID) | INTRAVENOUS | Status: DC
Start: 2015-10-09 — End: 2015-10-09
  Filled 2015-10-09: qty 50

## 2015-10-09 MED ORDER — ONDANSETRON HCL 4 MG/2ML IJ SOLN
4.0000 mg | Freq: Four times a day (QID) | INTRAMUSCULAR | Status: DC | PRN
  Administered 2015-10-09 – 2015-10-19 (×15): 4 mg via INTRAVENOUS
  Filled 2015-10-09 (×15): qty 2

## 2015-10-09 MED ORDER — TIZANIDINE HCL 2 MG PO TABS
4.0000 mg | ORAL_TABLET | ORAL | Status: DC
  Administered 2015-10-09 – 2015-10-30 (×40): 4 mg via ORAL
  Filled 2015-10-09 (×3): qty 2
  Filled 2015-10-09: qty 1
  Filled 2015-10-09 (×30): qty 2
  Filled 2015-10-09: qty 1
  Filled 2015-10-09 (×3): qty 2

## 2015-10-09 MED ORDER — VANCOMYCIN HCL IN DEXTROSE 1-5 GM/200ML-% IV SOLN: 1000.0000 mg | Freq: Once | INTRAVENOUS | Status: DC

## 2015-10-09 MED ORDER — BACLOFEN 10 MG PO TABS
20.0000 mg | ORAL_TABLET | Freq: Four times a day (QID) | ORAL | Status: DC
  Administered 2015-10-09 – 2015-10-30 (×79): 20 mg via ORAL
  Filled 2015-10-09 (×8): qty 2
  Filled 2015-10-09: qty 1
  Filled 2015-10-09 (×41): qty 2
  Filled 2015-10-09: qty 1
  Filled 2015-10-09 (×17): qty 2

## 2015-10-09 MED ORDER — NOREPINEPHRINE BITARTRATE 1 MG/ML IV SOLN
0.0000 ug/min | Freq: Once | INTRAVENOUS | Status: AC
  Administered 2015-10-09: 2 ug/min via INTRAVENOUS
  Filled 2015-10-09 (×2): qty 4

## 2015-10-09 MED ORDER — SODIUM CHLORIDE 0.9 % IV BOLUS (SEPSIS): 1000.0000 mL | INTRAVENOUS | Status: DC

## 2015-10-09 MED ORDER — PIPERACILLIN-TAZOBACTAM 3.375 G IVPB
3.3750 g | Freq: Three times a day (TID) | INTRAVENOUS | Status: DC
Start: 2015-10-09 — End: 2015-10-09
  Administered 2015-10-09: 3.375 g via INTRAVENOUS

## 2015-10-09 MED ORDER — SODIUM CHLORIDE 0.9 % IV SOLN
INTRAVENOUS | Status: DC
  Administered 2015-10-09: 05:00:00 via INTRAVENOUS

## 2015-10-09 MED ORDER — PIPERACILLIN-TAZOBACTAM 3.375 G IVPB
3.3750 g | Freq: Three times a day (TID) | INTRAVENOUS | Status: DC
  Filled 2015-10-09: qty 50

## 2015-10-09 MED ORDER — SODIUM CHLORIDE 0.9 % IV BOLUS (SEPSIS)
1000.0000 mL | Freq: Once | INTRAVENOUS | Status: AC
  Administered 2015-10-09: 1000 mL via INTRAVENOUS

## 2015-10-09 MED ORDER — CLONAZEPAM 0.5 MG PO TABS
0.5000 mg | ORAL_TABLET | Freq: Three times a day (TID) | ORAL | Status: DC
  Administered 2015-10-09 – 2015-10-30 (×61): 0.5 mg via ORAL
  Filled 2015-10-09 (×63): qty 1

## 2015-10-09 MED ORDER — CEFTRIAXONE SODIUM 1 G IJ SOLR
1.0000 g | Freq: Once | INTRAMUSCULAR | Status: AC
  Administered 2015-10-09: 1 g via INTRAVENOUS
  Filled 2015-10-09: qty 10

## 2015-10-09 NOTE — Progress Notes (Addendum)
18 55 Received call from Lauren RN regarding critical lab value Positive blood cultur and notified Dr. Kendrick FriesMcQuaid  eMD.

## 2015-10-09 NOTE — Progress Notes (Signed)
ANTIBIOTIC CONSULT NOTE - FOLLOW UP  Pharmacy Consult for Vancomycin and imipenem dosing Indication: Empiric for rule out sepsis  Allergies  Allergen Reactions  . Morphine Dermatitis and Other (See Comments)    Skin turned red    Patient Measurements: Height: 5' (152.4 cm) Weight: 132 lb 4.4 oz (60 kg) IBW/kg (Calculated) : 45.5   Vital Signs: Temp: 97.9 F (36.6 C) (11/07 0922) Temp Source: Oral (11/07 0922) BP: 148/79 mmHg (11/07 1100) Pulse Rate: 135 (11/07 1115) Intake/Output from previous day: 11/06 0701 - 11/07 0700 In: 5689.1 [P.O.:200; I.V.:1289.1; IV Piggyback:4200] Out: 1500 [Urine:1500] Intake/Output from this shift:    Labs:  Recent Labs  10/08/15 2339 10/09/15 0500  WBC 9.8 7.8  HGB 9.5* 8.3*  PLT 379 388  CREATININE 0.56  --    Estimated Creatinine Clearance: 74.9 mL/min (by C-G formula based on Cr of 0.56). No results for input(s): VANCOTROUGH, VANCOPEAK, VANCORANDOM, GENTTROUGH, GENTPEAK, GENTRANDOM, TOBRATROUGH, TOBRAPEAK, TOBRARND, AMIKACINPEAK, AMIKACINTROU, AMIKACIN in the last 72 hours.   Microbiology: Recent Results (from the past 720 hour(s))  Urine culture     Status: None   Collection Time: 09/24/15  3:18 PM  Result Value Ref Range Status   Specimen Description URINE, RANDOM  Final   Special Requests NONE  Final   Culture   Final    >=100,000 COLONIES/mL ESCHERICHIA COLI Confirmed Extended Spectrum Beta-Lactamase Producer (ESBL)    Report Status 09/27/2015 FINAL  Final   Organism ID, Bacteria ESCHERICHIA COLI  Final      Susceptibility   Escherichia coli - MIC*    AMPICILLIN >=32 RESISTANT Resistant     CEFAZOLIN >=64 RESISTANT Resistant     CEFTRIAXONE >=64 RESISTANT Resistant     CIPROFLOXACIN >=4 RESISTANT Resistant     GENTAMICIN 2 SENSITIVE Sensitive     IMIPENEM <=0.25 SENSITIVE Sensitive     NITROFURANTOIN 128 RESISTANT Resistant     TRIMETH/SULFA >=320 RESISTANT Resistant     AMPICILLIN/SULBACTAM 16 INTERMEDIATE  Intermediate     PIP/TAZO <=4 SENSITIVE Sensitive     * >=100,000 COLONIES/mL ESCHERICHIA COLI  MRSA PCR Screening     Status: None   Collection Time: 10/09/15  4:10 AM  Result Value Ref Range Status   MRSA by PCR NEGATIVE NEGATIVE Final    Comment:        The GeneXpert MRSA Assay (FDA approved for NASAL specimens only), is one component of a comprehensive MRSA colonization surveillance program. It is not intended to diagnose MRSA infection nor to guide or monitor treatment for MRSA infections.     Anti-infectives    Start     Dose/Rate Route Frequency Ordered Stop   10/09/15 1100  vancomycin (VANCOCIN) 500 mg in sodium chloride 0.9 % 100 mL IVPB  Status:  Discontinued     500 mg 100 mL/hr over 60 Minutes Intravenous Every 8 hours 10/09/15 0229 10/09/15 0429   10/09/15 1000  piperacillin-tazobactam (ZOSYN) IVPB 3.375 g  Status:  Discontinued     3.375 g 12.5 mL/hr over 240 Minutes Intravenous Every 8 hours 10/09/15 0229 10/09/15 0428   10/09/15 0800  piperacillin-tazobactam (ZOSYN) IVPB 3.375 g  Status:  Discontinued     3.375 g 12.5 mL/hr over 240 Minutes Intravenous Every 8 hours 10/09/15 0428 10/09/15 0920   10/09/15 0800  vancomycin (VANCOCIN) 500 mg in sodium chloride 0.9 % 100 mL IVPB     500 mg 100 mL/hr over 60 Minutes Intravenous Every 8 hours 10/09/15 0429  10/09/15 0230  piperacillin-tazobactam (ZOSYN) IVPB 3.375 g  Status:  Discontinued     3.375 g 12.5 mL/hr over 240 Minutes Intravenous Every 8 hours 10/09/15 0221 10/09/15 0428   10/09/15 0230  vancomycin (VANCOCIN) IVPB 1000 mg/200 mL premix  Status:  Discontinued     1,000 mg 200 mL/hr over 60 Minutes Intravenous  Once 10/09/15 0221 10/09/15 0427   10/09/15 0045  piperacillin-tazobactam (ZOSYN) IVPB 3.375 g     3.375 g 100 mL/hr over 30 Minutes Intravenous  Once 10/09/15 0032 10/09/15 0217   10/09/15 0045  vancomycin (VANCOCIN) IVPB 1000 mg/200 mL premix     1,000 mg 200 mL/hr over 60 Minutes Intravenous   Once 10/09/15 0032 10/09/15 0243   10/09/15 0030  cefTRIAXone (ROCEPHIN) 1 g in dextrose 5 % 50 mL IVPB     1 g 100 mL/hr over 30 Minutes Intravenous  Once 10/09/15 0018 10/09/15 0107      Assessment: [name] [name]) with extensive cerebral palsy and hx of recurrent UTIs who presents with 2 days of nonbloody N/V and fever. PMH includes ESBL E. Coli UTI.   WBC - WNL, Tmax 102 at admission, currently afebrile at 97.9, PCT mildly elevated at 0.25, LA normal.  Scr may be falsely elevated in the setting of low muscle mass.     Goal of Therapy:  Resolution of infection Vanc trough 15-20 mcg/mL  Plan:  D/c Zosyn Initiate imipenem  IV q6h  Adjust vanc to  IV q12h Monitor clinical status, vanc trough, renal function, culture results    Armanda Heritage 10/09/2015,11:29 AM  I have reviewed and agree with the above assessment and plan. Will continue to follow this patient.   Link Snuffer, PharmD, BCPS Clinical Pharmacist [phone] 10/09/2015, 12:26 PM

## 2015-10-09 NOTE — ED Provider Notes (Addendum)
Medical screening examination/treatment/procedure(s) were conducted as a shared visit with non-physician practitioner(s) and myself.  I personally evaluated the patient during the encounter.   EKG Interpretation   Date/Time:  Sunday October 08 2015 22:17:23 EST Ventricular Rate:  119 PR Interval:  115 QRS Duration: 82 QT Interval:  311 QTC Calculation: 437 R Axis:   77 Text Interpretation:  Sinus tachycardia Baseline wander in lead(s) I II  III aVR aVL aVF Confirmed by KNOTT MD, DANIEL ([id number]) on 10/08/2015  11:42:14 PM        12 :45 AM  Pt is a 39 y.o. F with history of cerebral palsy who lives in a nursing home who presents to the emergency department with fever, tachycardia, tachypnea. Patient found to have a urinary tract infection. Initially given CTX.  On review of her chart, pt had resistant bacteria the past including Escherichia coli, enterococcus and Pseudomonas. Given blood spectrum antibiotics - vancomycin and Zosyn. Cultures in the past are sensitive to these antibiotics. Patient initially received 2 L of IV fluids and Tylenol. Heart rate improved as her fever came down but her blood pressure is slowly worsening and now MAPs are in the 40s. Pt to receive more IVF and reassess.   1:30 AM  Patient receiving a third and fourth liter of fluid but is not fluid responsive, will start levophed. We'll discuss with critical care for admission. Patient also has elevation of her LFTs. Denies regular Tylenol alcohol use. Will obtain right upper quadrant ultrasound for further evaluation. She is tender in the right upper quadrant has a negative Murphy sign.   1:45 AM  CC at bedside to place central line.  BP improving with levophed.  Admitted to ICU.    CRITICAL CARE Performed by: Raelyn NumberWARD, KRISTEN N   Total critical care time: 35 minutes  Critical care time was exclusive of separately billable procedures and treating other patients.  Critical care was necessary to treat or prevent  imminent or life-threatening deterioration.  Critical care was time spent personally by me on the following activities: development of treatment plan with patient and/or surrogate as well as nursing, discussions with consultants, evaluation of patient's response to treatment, examination of patient, obtaining history from patient or surrogate, ordering and performing treatments and interventions, ordering and review of laboratory studies, ordering and review of radiographic studies, pulse oximetry and re-evaluation of patient's condition.   Layla MawKristen N Ward, DO 10/09/15 0123  Layla MawKristen N Ward, DO 10/09/15 0144  Layla MawKristen N Ward, DO 10/09/15 [id number]

## 2015-10-09 NOTE — Progress Notes (Signed)
Pt c/o nausea after eating lunch. She stated that she did not want any more of her lunch. Will continue to monitor for need of anti-emetic

## 2015-10-09 NOTE — Progress Notes (Signed)
Patient transferred from [facility] with RN to floor. Patient is alert &oreintedx4, denies any pain. Placed on tele box # 1, CCMD notified. Skin: Redness (but blanchable) on upper mid sacral area, foam dressing present, suprapubic catheter present. Patient has been oriented to staff and unit, Call bell placed within reach and bed in the lowest position. Will continue to monitor patient.

## 2015-10-09 NOTE — Progress Notes (Signed)
CRITICAL VALUE ALERT  Critical value received:  Blood cultures- gram + cocci and clusters in 1 aerobic bottle  Date of notification:  10/09/15  Time of notification:  1848  Critical value read back: yes  Nurse who received alert: Irven CoeLauren Kylea Berrong RN  MD notified (1st page):  Pola CornElink RN to MD  Time of first page:  1850  MD notified (2nd page):  Time of second page:  Responding MD:  Pola CornElink RN  Time MD responded:  [phone]

## 2015-10-09 NOTE — Progress Notes (Signed)
  Echocardiogram 2D Echocardiogram has been performed.  Leta JunglingCooper, Delno Blaisdell M 10/09/2015, 9:29 AM

## 2015-10-09 NOTE — Progress Notes (Signed)
Notified MD Mcquaid in regards to Temperature of 102.4 F orally. Since LFTs elevated will not give anything such as Tylenol PO. Passed along to RN at [address] during report.

## 2015-10-09 NOTE — Progress Notes (Addendum)
**  Critical Care Interim Note**  Patient doing well after suprapubic catheter change.  Has remained off of pressors.  Slightly tachycardic on exam.  Reportedly on antianxiety medications at home.  These were restarted by eLink provider, so anticipate normalization of HR.  She does have a transaminitis noted on 11/6 labs.  CMET ordered.  This will require further evaluation by Primary team.  Patient is otherwise doing well.  Will plan for transfer to telemetry floor and for Family Medicine Teaching service to resume care in am.  Sign out on this patient discussed with on call Family Medicine Resident.  Shaquira Moroz M. Nadine CountsGottschalk, DO PGY-2, [facility]

## 2015-10-09 NOTE — Progress Notes (Signed)
ANTIBIOTIC CONSULT NOTE - INITIAL  Pharmacy Consult for Zosyn, Vancomycin Indication: Sepsis   Allergies  Allergen Reactions  . Morphine Dermatitis and Other (See Comments)    Skin turned red    Patient Measurements: Height: (!) 5" (12.7 cm) Weight: 128 lb (58.06 kg) IBW/kg (Calculated) : -81  Vital Signs: Temp: 98.6 F (37 C) (11/07 0008) Temp Source: Oral (11/07 0008) BP: 94/42 mmHg (11/07 0200) Pulse Rate: 93 (11/07 0200) Intake/Output from previous day:   Intake/Output from this shift:    Labs:  Recent Labs  10/08/15 2339  WBC 9.8  HGB 9.5*  PLT 379  CREATININE 0.56   CrCl cannot be calculated (Unknown ideal weight.). No results for input(s): VANCOTROUGH, VANCOPEAK, VANCORANDOM, GENTTROUGH, GENTPEAK, GENTRANDOM, TOBRATROUGH, TOBRAPEAK, TOBRARND, AMIKACINPEAK, AMIKACINTROU, AMIKACIN in the last 72 hours.   Microbiology: Recent Results (from the past 720 hour(s))  Urine culture     Status: None   Collection Time: 09/24/15  3:18 PM  Result Value Ref Range Status   Specimen Description URINE, RANDOM  Final   Special Requests NONE  Final   Culture   Final    >=100,000 COLONIES/mL ESCHERICHIA COLI Confirmed Extended Spectrum Beta-Lactamase Producer (ESBL)    Report Status 09/27/2015 FINAL  Final   Organism ID, Bacteria ESCHERICHIA COLI  Final      Susceptibility   Escherichia coli - MIC*    AMPICILLIN >=32 RESISTANT Resistant     CEFAZOLIN >=64 RESISTANT Resistant     CEFTRIAXONE >=64 RESISTANT Resistant     CIPROFLOXACIN >=4 RESISTANT Resistant     GENTAMICIN 2 SENSITIVE Sensitive     IMIPENEM <=0.25 SENSITIVE Sensitive     NITROFURANTOIN 128 RESISTANT Resistant     TRIMETH/SULFA >=320 RESISTANT Resistant     AMPICILLIN/SULBACTAM 16 INTERMEDIATE Intermediate     PIP/TAZO <=4 SENSITIVE Sensitive     * >=100,000 COLONIES/mL ESCHERICHIA COLI    Medical History: Past Medical History  Diagnosis Date  . Cerebral palsy ([name])   . Pulmonary embolism ([name])      Lifetime Coumadin  . Cerebral palsy ([name])     Spastic Cerebral palsy, mentally intact  . Contracture, joint, multiple sites     Electric wheelchair, uses left hand to operate chair.   [name] Kitchen. Hypertension   . OCD (obsessive compulsive disorder)   . Depression   . Migraine   . [name]   . Dysarthria   . Endometriosis   . History of recurrent UTIs   . GERD (gastroesophageal reflux disease)   . Pulmonary embolism ([name]) 2011, 01/2011    Will be on lifetime coumadin  . Anemia   . Abdominal pain 01/02/2012    Overview:  Overview:  Per Previous pcp note- Dr. Ta--Pt has history of endometriosis and had DUB in 2011.  She She had u/s that showed normal endometrial stripe.  She had endometrial bx that was wnl.  She has a lot of abd cramping every month.  This cramping was sometimes not relieved with hydrocodone.  [facility] IUD placement to help endometriosis and abd cramping.  [name]  . Macroscopic hematuria 03/10/2013    status post replacement of suprapubic tube 03/04/2013   . HYPERTENSION, BENIGN 01/31/2010    Qualifier: Diagnosis of  By: Gala RomneyBensimhon, MD, Trixie DredgeFACC, Daniel R   . History of endometriosis 10/2012    OBGYN [facility]: Laparoscopic Endometrial Ablation, TVH,  . Gram positive sepsis ([name]) 10/24/2014  . HCAP (healthcare-associated pneumonia) 10/25/2014  .  Abdominal pain 01/02/2012    Overview:  Overview:  Per Previous pcp note- Dr. Ta--Pt has history of endometriosis and had DUB in 2011.  She She had u/s that showed normal endometrial stripe.  She had endometrial bx that was wnl.  She has a lot of abd cramping every month.  This cramping was sometimes not relieved with hydrocodone.  She was referred to [facility] for IUD placement to help endometriosis and abd cramping.  [name] was placed 04/30/11 by [facility].  Pt still complains of abd cramping, which I am treating with hydrocodone and ultram. Pt reports some initial relief of pain from IUD, but states it has  now returned back to similar to previous cramping. Gave patient a shot of Toradol 60 mg IM today-since positive exacerbation of abdominal cramping during past 2 weeks. Physical exam reassuring. Patient may need to return to [facility] for further workup since she is plugged in with the GYN providers there for further treatment options regarding endometriosis and abdominal cramping. The dysfunctional uterine bleeding now well contr  . Acute respiratory failure with hypoxia ([name])   . Cellulitis 12/22/2014  . Disease of female genital organs 10/24/2010    Overview:  Overview:  Pt has history of endometriosis and had DUB in 2011.  She had u/s that showed normal endometrial stripe.  She had endometrial bx that was wnl.  She has a lot of abd cramping every month.  This cramping was sometimes not relieved with hydrocodone.  She was referred to [facility] for IUD placement to help endometriosis and abd cramping.  [name] was placed 04/30/11 by [facility].  Pt still complains of abd cramping, which was treated with hydrocodone and ultram.   Last seen at [facility] by OB/GYN 12/24/11, given Depo Lupron injection.  Ordering CT scan to r/o additional cause of abdominal pain (pt with known endometriosis and will not tolerate vaginal U/S, pain not improved s/p [name] or with Lupron injection-- only caused hot flashes and amenorrhea, no pain relief).  Also made referral to pain clinic for better pain control.  Do not think hysterectomy would help with pain, and concern for contamination of baclofen pump if did proceed with surgery.    8/5/13[name] OB/GYN: Pt was seen by   . Dyspnea and respiratory abnormality 02/04/2011    Overview:  Overview:  Pt has history of recurrent PE and is on chronic coumadin.  Pt has been complaining of dyspnea that she describes as chest tightness.  She has been to the ER multiple times for this.  Usually she gets CTA and serial CE.  She has had at least 6 CTA in a period of since 2011.  She also has an  O2 requirement. It is unclear why pt feels dyspneic, complains of chest tightness, or has O2 requirement.  I thought that her complaint may be from deconditioning and referred her to PT.  Unfortunately Medicaid only pays for 4 PT visits.  Pt should be doing these exercises at home, but she has not.  I have referred her to [facility], and she has been seen by [name].  He did not understand the etiology of her dyspnea and O2 requirement.  He will continue to follow her.   Last Assessment & Plan:  Pt presents with concerns for chest tightness and dyspnea.  She was seen in ED yesterday and discharged home.  While there she had a CT chest 04/20/11 showed 1.  Multifocal degradation as detailed above.  N  .  Gross hematuria 03/12/2013    Overview:  Overview:  status post replacement of suprapubic tube 03/04/2013  Last Assessment & Plan:  Recent urine culture collected in ED on 5/4 did not result in significant growth. Moreover, patient has been asymptomatic with normal WBC, no fever and no true symptoms making symptomatic UTI less likely.  Will continue to monitor for any sign of infection.  Patient's xarelto had been held during her recent hospitalization due to hematuria. This has since been re-started. Monitor bleeding.    [name] Kitchen History of anticoagulant therapy 10/21/2012  . Infantile cerebral palsy ([name]) 01/29/2007    Overview:  She has spastic CP.  She is mentally intact.  She is wheelchair bound.  She is wheelchair bound for ambulation      . Intracervical pessary 05/07/2011    Overview:  Overview:  Placed by [facility] 04/30/11 to treat DUB and Endometriosis.  She is seen at GYN clinic at [facility] but was considered a poor surgical candidate and referred her to [facility].  For DUB she had pelvic ultrasound that showed thin stripe and she had endometrial Bx by Dr Jennette Kettle in GYN clinic, which was negative.     . Parapneumonic effusion 10/25/2014  . Presence of intrathecal baclofen pump 03/07/2014    R LQ. Insertion T10.     [name] Kitchen Psychiatric illness 10/31/2014  . Recurrent pulmonary embolism ([name]) 05/03/2011    Pt has history of recurrent PE and is on chronic coumadin.  Pt has been complaining of dyspnea that she describes as chest tightness.  She has been to the ER multiple times for this.  Usually she gets CTA and serial CE.  She has had at least 6 CTA in a period of since 2011.  She also has an O2 requirement. It is unclear why pt feels dyspneic, complains of chest tightness, or has O2 requirement.  I thought that her complaint may be from deconditioning and referred her to PT.  Unfortunately Medicaid only pays for 4 PT visits.  Pt should be doing these exercises at home, but she has not.  I have referred her to [facility], and she has been seen by [name].  He did not understand the etiology of her dyspnea and O2 requirement.  He will continue to follow her.     . Seborrheic keratosis 01/18/2011  . Spasticity 01/05/2014  . Transient alteration of awareness, recurrent 09/08/2006    Recurrent episodes where Brinda  loses contact with her world and that have been studied thoroughly and appear nonepileptic in nature per Dr Terrace Arabia (neurology) The patient has had episodes starting in 2007 that initially began 1-4 times a day during which time she would have periods of unawareness followed by confusion. This continuous video EEG monitoring performed from October 8-12, 2007. Had   . Acute cystitis without hematuria   . Acute cystitis with hematuria   . Urethra dilated and patulous 07/03/2015    Patulous, Dilated Urethra. 5mL balloon on a 22-Fr catheter hoping this would prevent the bladder spasm from pushing the balloon down the urethra (Dr Logan Bores, Urology [facility], July 2016)     Medications:   (Not in a hospital admission) Assessment: 75 YOF from SNF with extensive cerebral palsy and hx of recurrent UTIs who presents with 2 days of nonbooldy N/V and fever. Pharmacy consulted to start empiric Zosyn and Vancomycin for Sepsis. WBC wnl. Tm  102F. LA 0.34. CrCl ~ 95-100 mL/min  11/6 BCx2>> 11/6 UCx>>  Goal of Therapy:  Vancomycin trough level 15-20 mcg/ml  Plan:  -Zosyn 3.375 gm IV Q 8 hours (EI interval) -Vancomycin 1 gm IV once followed by Vancomycin 500 mg IV Q 8 hours  -Monitor CBC, renal fx, cultures and clinical progress -VT at [facility]  Vinnie Level, PharmD., BCPS Clinical Pharmacist Pager [phone]

## 2015-10-09 NOTE — H&P (Signed)
PULMONARY / CRITICAL CARE MEDICINE   Name: [name] SlickerJudith Genrich MRN: [id number] DOB: 1976/10/20    ADMISSION DATE:  10/08/2015   REFERRING MD :  EDP  CHIEF COMPLAINT:  hypotensive  INITIAL PRESENTATION: Hypotensive  STUDIES:    SIGNIFICANT EVENTS: 11-7 admit to ICU   HISTORY OF PRESENT ILLNESS:   39 YO SNF patient with Cerebral palsy, suprapubic cath, prior admits for sepsis from UTI and is now refractory to 4 litres of fluid with sbp 84 despite low dose levophed.  PCCM called to bedside to place CVL and admit to ICU until stabilized. She has been place on sepsis protocol despite nl lactic acids. Reportedly she is on anticoagulants for hx of PE but not supported with MAR demonstrating use anticoagulants.  PAST MEDICAL HISTORY :   has a past medical history of Cerebral palsy ([name]); Pulmonary embolism ([name]); Cerebral palsy ([name]); Contracture, joint, multiple sites; Hypertension; OCD (obsessive compulsive disorder); Depression; Migraine; [name]; Dysarthria; Endometriosis; History of recurrent UTIs; GERD (gastroesophageal reflux disease); Pulmonary embolism ([name]) (2011, 01/2011); Anemia; Abdominal pain (01/02/2012); Macroscopic hematuria (03/10/2013); HYPERTENSION, BENIGN (01/31/2010); History of endometriosis (10/2012); Gram positive sepsis ([name]) (10/24/2014); HCAP (healthcare-associated pneumonia) (10/25/2014); Abdominal pain (01/02/2012); Acute respiratory failure with hypoxia ([name]); Cellulitis (12/22/2014); Disease of female genital organs (10/24/2010); Dyspnea and respiratory abnormality (02/04/2011); Gross hematuria (03/12/2013); History of anticoagulant therapy (10/21/2012); Infantile cerebral palsy ([name]) (01/29/2007); Intracervical pessary (05/07/2011); Parapneumonic effusion (10/25/2014); Presence of intrathecal baclofen pump (03/07/2014); Psychiatric illness (10/31/2014); Recurrent pulmonary embolism ([name]) (05/03/2011); Seborrheic keratosis (01/18/2011); Spasticity (01/05/2014);  Transient alteration of awareness, recurrent (09/08/2006); Acute cystitis without hematuria; Acute cystitis with hematuria; and Urethra dilated and patulous (07/03/2015).  has past surgical history that includes Colposcopy (06/2000); Cesarean section; Tubal ligation (2003); Carpal tunnel release (08/2008); Wrist surgery (06/2010); Baclofen pump refill; Appendectomy; Intrauterine device insertion (04/30/11); laparoscopically assisted Vag Hysterectomy (10/27/2012); Multiple extractions with alveoloplasty (Bilateral, 07/19/2013); Pain pump implantation (N/A, 03/07/2014); and Programable baclofen pump revision (03/17/14). Prior to Admission medications   Medication Sig Start Date End Date Taking? Authorizing Provider  acetaminophen (TYLENOL) 325 MG tablet Take 650 mg by mouth every 6 (six) hours as needed for mild pain.   Yes Historical Provider, MD  bacitracin-polymyxin b (POLYSPORIN) ointment Apply 1 g topically 2 (two) times daily. Apply around Suprapubic ostomy site   Yes Historical Provider, MD  baclofen (LIORESAL) 20 MG tablet Take 20 mg by mouth 4 (four) times daily.   Yes Historical Provider, MD  clonazePAM (KLONOPIN) 1 MG tablet Take 1 tablet (1 mg total) by mouth 4 (four) times daily. 09/20/15  Yes Kirt BoysMonica Carter, DO  Dimethicone (MOISTURE BARRIER EX) Apply 1 application topically as needed.   Yes Historical Provider, MD  docusate sodium (COLACE) 100 MG capsule Take 100 mg by mouth 2 (two) times daily.   Yes Historical Provider, MD  flavoxATE (URISPAS) 100 MG tablet Take 100 mg by mouth 3 (three) times daily as needed for bladder spasms.   Yes Historical Provider, MD  FLUoxetine (PROZAC) 10 MG capsule Take 10 mg by mouth daily.   Yes Historical Provider, MD  magnesium hydroxide (MILK OF MAGNESIA) 800 MG/5ML suspension Take 30 mLs by mouth daily as needed for constipation.   Yes Historical Provider, MD  Multiple Vitamin (MULTIVITAMIN) tablet Take 1 tablet by mouth daily.   Yes Historical Provider, MD   oxyCODONE (OXY IR/ROXICODONE) 5 MG immediate release tablet Take 5 mg by mouth every 4 (four) hours as needed for moderate pain or severe pain (may give  2 tablets every 4 hours for severe pain).   Yes Historical Provider, MD  polyethylene glycol powder (GLYCOLAX/MIRALAX) powder Take 17 g by mouth 2 (two) times daily. 06/21/15  Yes Tyrone Nine, MD  QUEtiapine (SEROQUEL) 100 MG tablet Take 1 tablet (100 mg total) by mouth at bedtime. 06/21/15  Yes Tyrone Nine, MD  senna-docusate (SENOKOT-S) 8.6-50 MG per tablet Take 1 tablet by mouth 2 (two) times daily. Patient taking differently: Take 1 tablet by mouth daily.  01/02/15  Yes Stephanie Coup Street, MD  tiZANidine (ZANAFLEX) 4 MG tablet Take 1 tablet (4 mg total) by mouth 2 (two) times daily. Admin at 04:30 and qHS Patient taking differently: Take 4 mg by mouth 2 (two) times daily. Admin at 05:00 and QHS 06/28/15  Yes Tyrone Nine, MD  HYDROcodone-acetaminophen (NORCO/VICODIN) 5-325 MG per tablet Take 1 tablet by mouth every 6 (six) hours as needed (pain in both knees). Patient not taking: Reported on 10/08/2015 05/15/15   Leighton Roach McDiarmid, MD  ibuprofen (ADVIL,MOTRIN) 200 MG tablet Take 400 mg by mouth every 8 (eight) hours as needed for moderate pain.     Historical Provider, MD  SUMAtriptan (IMITREX) 100 MG tablet Take 100 mg by mouth every 2 (two) hours as needed for migraine or headache. May repeat in 2 hours if headache persists or recurs.    Historical Provider, MD   Allergies  Allergen Reactions  . Morphine Dermatitis and Other (See Comments)    Skin turned red    FAMILY HISTORY:  indicated that her mother is alive. She indicated that her father is alive. She indicated that her brother is alive. She indicated that her maternal grandmother is deceased. She indicated that her maternal grandfather is deceased. She indicated that her paternal grandmother is deceased. She indicated that her paternal grandfather is deceased. She indicated that her  daughter is alive. She indicated that her son is alive.  SOCIAL HISTORY:  reports that she has never smoked. She has never used smokeless tobacco. She reports that she drinks about 0.6 oz of alcohol per week. She reports that she does not use illicit drugs.  REVIEW OF SYSTEMS:  na  SUBJECTIVE:   VITAL SIGNS: Temp:  [98.6 F (37 C)-102 F (38.9 C)] 98.6 F (37 C) (11/07 0008) Pulse Rate:  [71-140] 87 (11/07 0215) Resp:  [18-41] 29 (11/07 0215) BP: (70-105)/(32-86) 102/46 mmHg (11/07 0215) SpO2:  [91 %-96 %] 95 % (11/07 0215) Weight:  [128 lb (58.06 kg)] 128 lb (58.06 kg) (11/06 2246) HEMODYNAMICS:   VENTILATOR SETTINGS:   INTAKE / OUTPUT: No intake or output data in the 24 hours ending 10/09/15 0230  PHYSICAL EXAMINATION: General:  WF with contractures from CP Neuro:  Follows commands, lucid HEENT:  Neck contracted to right, oral mucosa dry Cardiovascular:  HSR RRR Lungs:  CTA Abdomen:  Soft +bs. Suprapubic catheter in place Musculoskeletal:  Multple joint contractures Skin:  Cool and dry  LABS:  CBC  Recent Labs Lab 10/08/15 2339  WBC 9.8  HGB 9.5*  HCT 30.1*  PLT 379   Coag's No results for input(s): APTT, INR in the last 168 hours. BMET  Recent Labs Lab 10/08/15 2339  NA 135  K 3.5  CL 103  CO2 21*  BUN 8  CREATININE 0.56  GLUCOSE 105*   Electrolytes  Recent Labs Lab 10/08/15 2339  CALCIUM 8.3*   Sepsis Markers  Recent Labs Lab 10/08/15 2316 10/09/15 0140  LATICACIDVEN 0.74 0.34*  ABG No results for input(s): PHART, PCO2ART, PO2ART in the last 168 hours. Liver Enzymes  Recent Labs Lab 10/08/15 2339  AST 197*  ALT 229*  ALKPHOS 366*  BILITOT 1.9*  ALBUMIN 3.1*   Cardiac Enzymes No results for input(s): TROPONINI, PROBNP in the last 168 hours. Glucose No results for input(s): GLUCAP in the last 168 hours.  Imaging Dg Chest Port 1 View  10/08/2015  CLINICAL DATA:  Concern for sepsis. EXAM: PORTABLE CHEST 1 VIEW  COMPARISON:  Chest CT 09/24/2015 FINDINGS: Chronically low lung volumes. There is no edema, consolidation, effusion, or pneumothorax. Normal heart size and mediastinal contours for technique. No acute osseous finding.  Lower thoracic spinal catheter noted. IMPRESSION: Negative for pneumonia. Electronically Signed   By: Marnee Spring M.D.   On: 10/08/2015 23:28     ASSESSMENT / PLAN:  PULMONARY OETT A: No acute issue P:   O2 as needed  CARDIOVASCULAR CVL 11/7 l i j >> A:  Shock from presumed sepsis P:  Place cvl Check cvp Fluid challenge Pressor support May need 2 D for cardiac eval Neg tropin's   RENAL A:  No acute issue P:   Monitor creatine   GASTROINTESTINAL A:  GI protection P:   PPI NPO for now  HEMATOLOGIC A:  No acute issue, hx of anticoagulants for PE P:  Check INR  INFECTIOUS A:   PRESUMED UROSEPSIS P:   BCx2 11/7>> UC 11/7>> Sputum Abx:  11/7 vanc>> 11/7 zosyn>>  ENDOCRINE A:   No acute issue P:     NEUROLOGIC A:  Cerebral palsy  P:   RASS goal: 1 Continue home meds   FAMILY  - Updates:   - Inter-disciplinary family meet or Palliative Care meeting due by:  day 7    TODAY'S SUMMARY:  39 YO SNF patient with Cerebral palsy, suprapubic cath, prior admits for sepsis from UTI and is now refractory to 4 litres of fluid with sbp 84 despite low dose levophed.  PCCM called to bedside to place CVL and admit to ICU until stabilized. She has been place on sepsis protocol despite nl lactic acids. Reportedly she is on anticoagulants for hx of PE but not supported with MAR demonstrating use anticoagulants.  45 min cc time  Brett Canales Minor ACNP [facility] PCCM Pager [phone] till 3 pm If no answer page [phone] 10/09/2015, 2:38 AM

## 2015-10-09 NOTE — Progress Notes (Signed)
Notified MD Delynn FlavinAshly Gottschalk in regards to liver function enzymes being elevated on 10/08/15. Also notified her in regards to patient's HR being elevated in 120s (Sinus tacycardia), and being tachypneic. Pt does feels anxious but also has temperature of 101.3 F orally which [facility]. Placed ice bags on patient to help with temperature. Pt is alert and oriented x 4. Will continue to monitor and assess.

## 2015-10-09 NOTE — Progress Notes (Signed)
Pt no longer complaining of nausea.

## 2015-10-09 NOTE — Progress Notes (Signed)
eLink Physician-Brief Progress Note Patient Name: Mindi SlickerJudith Dupre DOB: 1976/04/18 MRN: [id number]   Date of Service  10/09/2015  HPI/Events of Note  1) GPC in blood culture, on vanc 2) requests restart clonazepam, baclofen; takes high doses of clonazepam at home  eICU Interventions  Restart clonazepam at a lower dose Start baclofen      Intervention Category Major Interventions: Infection - evaluation and management Minor Interventions: Routine modifications to care plan (e.g. PRN medications for pain, fever)  Max FickleDouglas Radiance Deady 10/09/2015, 7:05 PM

## 2015-10-09 NOTE — Procedures (Signed)
Central Venous Catheter Insertion Procedure Note [name] SlickerJudith [name] [id number] [birth year]  Procedure: Insertion of Central Venous Catheter Indications: Assessment of intravascular volume, Drug and/or fluid administration and Frequent blood sampling  Procedure Details Consent: Risks of procedure as well as the alternatives and risks of each were explained to the (patient/caregiver).  Consent for procedure obtained. Time Out: Verified patient identification, verified procedure, site/side was marked, verified correct patient position, special equipment/implants available, medications/allergies/relevent history reviewed, required imaging and test results available.  Performed  Maximum sterile technique was used including antiseptics, cap, gloves, gown, hand hygiene, mask and sheet. Skin prep: Chlorhexidine; local anesthetic administered A antimicrobial bonded/coated triple lumen catheter was placed in the left internal jugular vein using the Seldinger technique. Ultrasound guidance used.Yes.   Catheter placed to 16 cm. Blood aspirated via all 3 ports and then flushed x 3. Line sutured x 2 and dressing applied.  Evaluation Blood flow good Complications: No apparent complications Patient did tolerate procedure well. Chest X-ray ordered to verify placement.  CXR: normal.  Brett CanalesSteve Minor ACNP [facility]ackLe [facility] PCCM Pager [phone] till 3 pm If no answer page [phone] 10/09/2015, 2:28 AM

## 2015-10-09 NOTE — Care Management Note (Signed)
Case Management Note  Patient Details  Name: [name] [name] MRN: [id number] Date of Birth: [birth year]  Subjective/Objective:         Admitted from SNF - Hypotension - SW consult placed.            Action/Plan:   Expected Discharge Date:                  Expected Discharge Plan:  Skilled Nursing Facility  In-House Referral:  Clinical Social Work  Discharge planning Services  CM Consult  Post Acute Care Choice:    Choice offered to:     DME Arranged:    DME Agency:     HH Arranged:    HH Agency:     Status of Service:  In process, will continue to follow  Medicare Important Message Given:    Date Medicare IM Given:    Medicare IM give by:    Date Additional Medicare IM Given:    Additional Medicare Important Message give by:     If discussed at Long Length of Stay Meetings, dates discussed:    Additional Comments:  Vangie BickerBrown, Kenidy Crossland Jane, RN 10/09/2015, 8:16 AM

## 2015-10-10 ENCOUNTER — Ambulatory Visit: Payer: Medicaid Other | Admitting: Neurology

## 2015-10-10 ENCOUNTER — Telehealth: Payer: Self-pay | Admitting: Neurology

## 2015-10-10 DIAGNOSIS — A419 Sepsis, unspecified organism: Secondary | ICD-10-CM

## 2015-10-10 DIAGNOSIS — R6521 Severe sepsis with septic shock: Secondary | ICD-10-CM

## 2015-10-10 LAB — COMPREHENSIVE METABOLIC PANEL
ALBUMIN: 2.1 g/dL — AB (ref 3.5–5.0)
ALK PHOS: 190 U/L — AB (ref 38–126)
ALT: 87 U/L — ABNORMAL HIGH (ref 14–54)
ANION GAP: 6 (ref 5–15)
AST: 27 U/L (ref 15–41)
BUN: <5 mg/dL — ABNORMAL LOW (ref 6–20)
CHLORIDE: 106 mmol/L (ref 101–111)
CO2: 23 mmol/L (ref 22–32)
Calcium: 7.3 mg/dL — ABNORMAL LOW (ref 8.9–10.3)
Creatinine, Ser: 0.33 mg/dL — ABNORMAL LOW (ref 0.44–1.00)
GFR calc non Af Amer: >60 mL/min (ref >60)
GLUCOSE: 103 mg/dL — AB (ref 65–99)
Potassium: 3.5 mmol/L (ref 3.5–5.1)
SODIUM: 135 mmol/L (ref 135–145)
Total Bilirubin: 0.6 mg/dL (ref 0.3–1.2)
Total Protein: 5 g/dL — ABNORMAL LOW (ref 6.5–8.1)

## 2015-10-10 LAB — CBC
HEMATOCRIT: 22.7 % — AB (ref 36.0–46.0)
HEMOGLOBIN: 7 g/dL — AB (ref 12.0–15.0)
MCH: 27.2 pg (ref 26.0–34.0)
MCHC: 30.8 g/dL (ref 30.0–36.0)
MCV: 88.3 fL (ref 78.0–100.0)
Platelets: 339 10*3/uL (ref 150–400)
RBC: 2.57 MIL/uL — AB (ref 3.87–5.11)
RDW: 16.1 % — ABNORMAL HIGH (ref 11.5–15.5)
WBC: 7.7 10*3/uL (ref 4.0–10.5)

## 2015-10-10 MED ORDER — SODIUM CHLORIDE 0.9 % IJ SOLN
10.0000 mL | INTRAMUSCULAR | Status: DC | PRN
  Administered 2015-10-10 – 2015-10-11 (×2): 10 mL
  Administered 2015-10-14: 20 mL
  Administered 2015-10-17: 10 mL
  Administered 2015-10-18: 30 mL
  Administered 2015-10-18 – 2015-10-25 (×6): 20 mL
  Filled 2015-10-10 (×11): qty 40

## 2015-10-10 MED ORDER — ACETAMINOPHEN 325 MG PO TABS
650.0000 mg | ORAL_TABLET | Freq: Four times a day (QID) | ORAL | Status: DC | PRN
  Administered 2015-10-10 – 2015-10-26 (×16): 650 mg via ORAL
  Filled 2015-10-10 (×16): qty 2

## 2015-10-10 NOTE — Telephone Encounter (Signed)
Called patient to see if pharmacy had called her  . Patient is back in the Hospital.

## 2015-10-10 NOTE — Telephone Encounter (Signed)
Patient has been approved [employer] approval # [id number] 10-11-2015 -02/06/2016. [facility] will call me back for shipment date.

## 2015-10-10 NOTE — NC FL2 (Signed)
[place] MEDICAID FL2 LEVEL OF CARE SCREENING TOOL     IDENTIFICATION  Patient Name: [name] [name] Birthdate: 1976-06-26 Sex: female Admission Date (Current Location): 10/08/2015  [place] and [place] Number: Haynes Bast  [id number] P Facility and Address:  [facility], [address], [place], [place] [address]      Provider Number: [id number]  Attending Physician Name and Address:  Doreene Eland, MD  Relative Name and Phone Number:  Heyli Min [phone]    Current Level of Care: Hospital Recommended Level of Care: Skilled Nursing Facility Prior Approval Number:    Date Approved/Denied:   PASRR Number: [id number]  Discharge Plan: SNF    Current Diagnoses: Patient Active Problem List   Diagnosis Date Noted  . Shock ([name]) 10/09/2015  . Pressure ulcer 10/09/2015  . Thyroid mass of unclear etiology 10/09/2015  . Sepsis ([name])   . Malfunction of Foley catheter ([name])   . Urethra dilated and patulous 07/03/2015  . Person living in residential institution 06/14/2015  . Urinary tract infectious disease   . Cerebral palsy ([name])   . Abdominal pain   . Pulmonary hypertension ([name])   . Anemia of chronic disease   . Suprapubic catheter ([name]) 05/16/2015  . Spasticity 03/15/2015  . Irritant injury of right ear 01/11/2015  . Constipation, chronic 10/10/2014  . Athetoid cerebral palsy ([name]) 04/13/2014  . Dystonia 04/12/2014  . Presence of intrathecal baclofen pump 03/07/2014  . Nursing home resident 09/16/2013  . Dental caries 07/18/2013  . Multiple thyroid nodules 05/20/2013  . Osteoarthrosis 04/07/2013  . [name]   . Dysarthria   . GERD (gastroesophageal reflux disease)   . Neurogenic bladder 02/05/2013  . Anxiety state 10/21/2012  . Healed or old pulmonary embolism 10/21/2012  . Chronic pain syndrome 06/17/2012  . Recurrent pulmonary embolism ([name]) 05/03/2011  . Chronic suprapubic catheter ([name]) 03/15/2011  .  Long term (current) use of anticoagulants 01/12/2011  . Disease of female genital organs 10/24/2010  . HYPERTENSION, BENIGN 01/31/2010  . CP (cerebral palsy), spastic ([name]) 12/27/2008  . Contracted, joint, multiple sites 12/27/2008  . Major depressive disorder, recurrent episode ([name]) 01/29/2007  . Obsessive Compulsive Disorder 01/29/2007  . Transient alteration of awareness, recurrent 09/08/2006    Orientation ACTIVITIES/SOCIAL BLADDER RESPIRATION    Self, Time, Situation, Place    Continent Normal  BEHAVIORAL SYMPTOMS/MOOD NEUROLOGICAL BOWEL NUTRITION STATUS      Continent  (Regular diet with thin liquids. )  PHYSICIAN VISITS COMMUNICATION OF NEEDS Height & Weight Skin    Verbally 5' (152.4 cm) 138 lbs. PU Stage and Appropriate Care   PU Stage 2 Dressing: TID      AMBULATORY STATUS RESPIRATION      Normal      Personal Care Assistance Level of Assistance  Bathing, Feeding, Dressing            Functional Limitations Info  Speech, Hearing, Sight, Contractures Sight Info: Adequate Hearing Info: Adequate Speech Info: Adequate Contractures Info: Adequate     SPECIAL CARE FACTORS FREQUENCY                      Additional Factors Info  Code Status, Allergies, Isolation Precautions Code Status Info: Code Status: FULL CODE Allergies Info: Morphine (Dermatitis, skin turns red)     Isolation Precautions Info: Contact precautions.     Current Medications (10/10/2015): Current Facility-Administered Medications  Medication Dose Route Frequency Provider Last Rate Last Dose  . baclofen (LIORESAL)  tablet 20 mg  20 mg Oral QID Lupita Leash, MD   20 mg at 10/10/15 1025  . clonazePAM (KLONOPIN) tablet 0.5 mg  0.5 mg Oral 3 times per day Lupita Leash, MD   0.5 mg at 10/10/15 0641  . heparin injection 5,000 Units  5,000 Units Subcutaneous 3 times per day Vilinda Blanks Minor, NP   5,000 Units at 10/10/15 0640  . imipenem-cilastatin (PRIMAXIN) 250 mg in sodium  chloride 0.9 % 100 mL IVPB  250 mg Intravenous 4 times per day Quenton Fetter, RPH   250 mg at 10/10/15 1213  . ondansetron (ZOFRAN) injection 4 mg  4 mg Intravenous Q6H PRN Praveen Mannam, MD   4 mg at 10/09/15 1952  . pantoprazole (PROTONIX) EC tablet 40 mg  40 mg Oral QHS Kalman Shan, MD   40 mg at 10/09/15 2136  . QUEtiapine (SEROQUEL) tablet 100 mg  100 mg Oral QHS Lupita Leash, MD   100 mg at 10/09/15 2137  . sodium chloride 0.9 % injection 10-40 mL  10-40 mL Intracatheter PRN Kalman Shan, MD   10 mL at 10/10/15 0501  . tiZANidine (ZANAFLEX) tablet 4 mg  4 mg Oral 2 times per day Lupita Leash, MD   4 mg at 10/10/15 1610  . vancomycin (VANCOCIN) IVPB 750 mg/150 ml premix  750 mg Intravenous Q12H Quenton Fetter, RPH   750 mg at 10/10/15 1024   Do not use this list as official medication orders. Please verify with discharge summary.  Discharge Medications:   Medication List    ASK your doctor about these medications        acetaminophen 325 MG tablet  Commonly known as:  TYLENOL  Take 650 mg by mouth every 6 (six) hours as needed for mild pain.     bacitracin-polymyxin b ointment  Commonly known as:  POLYSPORIN  Apply 1 g topically 2 (two) times daily. Apply around Suprapubic ostomy site     baclofen 20 MG tablet  Commonly known as:  LIORESAL  Take 20 mg by mouth 4 (four) times daily.     clonazePAM 1 MG tablet  Commonly known as:  KLONOPIN  Take 1 tablet (1 mg total) by mouth 4 (four) times daily.     docusate sodium 100 MG capsule  Commonly known as:  COLACE  Take 100 mg by mouth 2 (two) times daily.     flavoxATE 100 MG tablet  Commonly known as:  URISPAS  Take 100 mg by mouth 3 (three) times daily as needed for bladder spasms.     FLUoxetine 10 MG capsule  Commonly known as:  PROZAC  Take 10 mg by mouth daily.     HYDROcodone-acetaminophen 5-325 MG tablet  Commonly known as:  NORCO/VICODIN  Take 1 tablet by mouth every 6 (six) hours as  needed (pain in both knees).     ibuprofen 200 MG tablet  Commonly known as:  ADVIL,MOTRIN  Take 400 mg by mouth every 8 (eight) hours as needed for moderate pain.     IMITREX 100 MG tablet  Generic drug:  SUMAtriptan  Take 100 mg by mouth every 2 (two) hours as needed for migraine or headache. May repeat in 2 hours if headache persists or recurs.     magnesium hydroxide 800 MG/5ML suspension  Commonly known as:  MILK OF MAGNESIA  Take 30 mLs by mouth daily as needed for constipation.     MOISTURE BARRIER EX  Apply 1 application topically as needed.     multivitamin tablet  Take 1 tablet by mouth daily.     oxyCODONE 5 MG immediate release tablet  Commonly known as:  Oxy IR/ROXICODONE  Take 5 mg by mouth every 4 (four) hours as needed for moderate pain or severe pain (may give 2 tablets every 4 hours for severe pain).     polyethylene glycol powder powder  Commonly known as:  GLYCOLAX/MIRALAX  Take 17 g by mouth 2 (two) times daily.     QUEtiapine 100 MG tablet  Commonly known as:  SEROQUEL  Take 1 tablet (100 mg total) by mouth at bedtime.     senna-docusate 8.6-50 MG tablet  Commonly known as:  Senokot-S  Take 1 tablet by mouth 2 (two) times daily.     tiZANidine 4 MG tablet  Commonly known as:  ZANAFLEX  Take 1 tablet (4 mg total) by mouth 2 (two) times daily. Admin at 04:30 and qHS        Relevant Imaging Results:  Relevant Lab Results:  Recent Labs    Additional Information Patient is developmentally delayed; follows commands   Okey DupreCrawford, Lazaro ArmsVanessa Bradley, LCSW

## 2015-10-10 NOTE — Clinical Social Work Note (Signed)
Clinical Social Work Assessment  Patient Details  Name: [name] SlickerJudith [name] MRN: [id number] Date of Birth: Aug 09, 1976  Date of referral:  10/10/15               Reason for consult:  Facility Placement                Permission sought to share information with:  Facility Medical sales representativeContact Representative Permission granted to share information::  Yes, Verbal Permission Granted  Name::     Rudell Cobbom Curenton  Agency::     Relationship::  Father   Contact Information:  [phone]  Housing/Transportation Living arrangements for the past 2 months:  Skilled Nursing Facility Teacher, adult education([facility] ) Source of Information:  Patient, Parent (Rudell Cobbom Zane (parent)) Patient Interpreter Needed:  None Criminal Activity/Legal Involvement Pertinent to Current Situation/Hospitalization:  No - Comment as needed Significant Relationships:  Parents, Friend Rudell Cobb([name] Mantey (father), Rennis PettyLee McCraven (friend)) Lives with:  Facility Resident ([facility] came [facility] ) Do you feel safe going back to the place where you live?  Yes Need for family participation in patient care:  Yes (Comment)  Care giving concerns:  Not discussed.    Social Worker assessment / plan:  CSW Intern spoke with both [name] [name] and her father Rudell Cobbom In[place] regarding discharge planning.  CSW Intern spoke with patient to confirm her return to [facility] when she was ready for discharge. The patient then expressed that her father was contacted by [facility], and informed that they would have to pay in order to hold a bed as the facility is nearing capacity. [name] [name] also stated that she was nervous and anxious about the possibility of having to go to another facility. The patient stated that she had [facility] years and never had to deal with this situtation before. Patient's speech was very slurred and CSW Intern had  difficulty understanding the patient. [name] [name] permitted permission for the CSW Intern to contact her father and [name] for further  explanation.   CSW Intern called and spoke with Dois DavenportSandra from [facility] for clarity on patient's situation regarding her returning to facility. Dois DavenportSandra confirmed that [name] was requiring payment to hold the patient's bed due to the facility nearing it's capacity. Dois DavenportSandra stated that if a bed was available on the day of discharge, the facility would willingly take the patient back.   After speaking with Dois DavenportSandra from [facility], CSW Intern called and spoke with Arna Snipeom Berenson. [name] stated that he could make a payment once, but after that could no longer afford to hold a bed since the hold was only valid for three days. [name] was in agreement with his daughter's information being sent out to facilities [place], in case there is no bed availability at [facility] on day of discharge.    Employment status:  Disabled (Comment on whether or not currently receiving Disability) Insurance information:  [place] Recommend[place]:  Not assessed at this time Information / Referral to community resources:  Skilled Nursing Facility  Patient/Family's Response to care:  Not discussed.   Patient/Family's Understanding of and Emotional Response to Diagnosis, Current Treatment, and Prognosis: Not discussed.   Emotional Assessment Appearance:  Appears stated age Attitude/Demeanor/Rapport:    Affect (typically observed):  Anxious (Patient is anxious about going to another facility. ) Orientation:  Oriented to Self, Oriented to Place, Oriented to  Time, Oriented to Situation Alcohol / Substance use:  Alcohol Use (Patient reports that she drinks 0.6oz of alcohol  per week. )  Psych involvement (Current and /or in the community):  No (Comment)  Discharge Needs  Concerns to be addressed:  No discharge needs identified Readmission within the last 30 days:  Yes Current discharge risk:  Other (Deconditioned. ) Barriers to Discharge:  No Barriers Identified   Baldo Daub, Student-SW 10/10/2015, 1:51  PM

## 2015-10-10 NOTE — Progress Notes (Signed)
Family Medicine Teaching Service Daily Progress Note Intern Pager: [phone]  Patient name: [name] SlickerJudith [name] Medical record number: [id number] Date of birth: 02/04/1976 Age: 39 y.o. Gender: female  Primary Care Provider: Caryl AdaJazma Phelps, DO Consultants: none Code Status: FULL  Pt Overview and Major Events to Date:  11/7: Admitted to [facility] for sepsis from UTI; refractory to 4 litres of fluid with sbp 84 despite low dose levophed. Has been off pressors for most of 11/7. Subprapubic cath changed as well.   Assessment and Plan: Sepsis 2/2 UTI with suprapubic catheter: Has suprapubic catheter.  Initially, refractory to 4 litres of fluid with sbp 84 despite low dose levophed. Has been off pressors for most of 11/7. Febrile 101.70F ~8pm; has been afebrile since. Tachycardia resolved this morning. WBC wnl. Lactic Acid 11/7: 0.6, Procalcitonin 0.25 - Urine culture: pending - Blood culture: pending - Continue Vanc and Imipenem (11/7>)  Transaminitis: Initially elevated 366/197/229> 190/27/87. Improving.  11/7:PT: 18.6; aPTT 33; INR1.41.Possibly from sepsis. Additionally, patient notes of Tylenol use for the past few months - will monitor  Normocytic Anemia: Hgb 7.0 today; on admission 9.5. No active bleeding noted clinically.  - will monitor with AM CBC  Major Depression/Anxiety: - cont home Klonopin  - cont home Seroquel  GERD: - protonix  Hx PE with hx of anticoagulation: Per patient, had recent vaginal bleeding which [facility]; was told to stop taking anticoagulation until outpatient follow up.   Cerebral Palsy - Tizanidine  FEN/GI: normal diet;  PPx: SQ Heparin   Disposition: pending management of UTI  Subjective:  - doing okay today  - had a fever earlier today - no pain  Objective: Temp:  [97.9 F (36.6 C)-101.3 F (38.5 C)] 99.7 F (37.6 C) (11/08 0500) Pulse Rate:  [59-137] 86 (11/08 0500) Resp:  [6-55] 20 (11/08 0500) BP: (93-185)/(49-168)  112/49 mmHg (11/08 0500) SpO2:  [78 %-100 %] 93 % (11/08 0500) Weight:  [138 lb 3.7 oz (62.7 kg)] 138 lb 3.7 oz (62.7 kg) (11/07 2254) Physical Exam: General: NAD, resting HEENT: Neck contracted to right, MMM Cardiovascular: RRR, no m/r/g Respiratory: CTAB bilaterally Abdomen: soft, NT, ND, suprapubic catheter in place Extremities: warm and dry   Laboratory:  Recent Labs Lab 10/08/15 2339 10/09/15 0500 10/10/15 0455  WBC 9.8 7.8 7.7  HGB 9.5* 8.3* 7.0*  HCT 30.1* 27.1* 22.7*  PLT 379 388 339    Recent Labs Lab 10/08/15 2339 10/10/15 0455  NA 135 135  K 3.5 3.5  CL 103 106  CO2 21* 23  BUN 8 <5*  CREATININE 0.56 0.33*  CALCIUM 8.3* 7.3*  PROT 6.5 5.0*  BILITOT 1.9* 0.6  ALKPHOS 366* 190*  ALT 229* 87*  AST 197* 27  GLUCOSE 105* 103*  Magnesium: 2.7  Imaging/Diagnostic Tests: CXR 11/7: no acute infiltrates  Palma HolterKanishka G Georgina Krist, MD 10/10/2015, 7:35 AM PGY-1, [place] [facility] FPTS Intern pager: [phone], text pages welcome

## 2015-10-10 NOTE — Clinical Social Work Placement (Signed)
   CLINICAL SOCIAL WORK PLACEMENT  NOTE  Date:  10/10/2015  Patient Details  Name: Mindi SlickerJudith Paff MRN: [id number] Date of Birth: 1976-01-29  Clinical Social Work is seeking post-discharge placement for this patient at the Skilled  Nursing Facility level of care (*CSW will initial, date and re-position this form in  chart as items are completed):      Patient/family provided with [facility] Clinical Social Work Department's list of facilities offering this level of care within the geographic area requested by the patient (or if unable, by the patient's family).  Yes   Patient/family informed of their freedom to choose among providers that offer the needed level of care, that participate in Medicare, Medicaid or managed care program needed by the patient, have an available bed and are willing to accept the patient.      Patient/family informed of [place] [facility]'s ownership interest in [facility] and [facility], as well as of the fact that they are under no obligation to receive care at these facilities.  PASRR submitted to EDS on 10/10/15     PASRR number received on 10/10/15     Existing PASRR number confirmed on 10/10/15     FL2 transmitted to all facilities in geographic area requested by pt/family on       FL2 transmitted to all facilities within larger geographic area on       Patient informed that his/her managed care company has contracts with or will negotiate with certain facilities, including the following:            Patient/family informed of bed offers received.  Patient chooses bed at       Physician recommends and patient chooses bed at      Patient to be transferred to   on  .  Patient to be transferred to facility by       Patient family notified on   of transfer.  Name of family member notified:        PHYSICIAN       Additional Comment:    _______________________________________________ Baldo DaubJolan Felicity Penix, Student-SW 10/10/2015, 3:01 PM

## 2015-10-11 DIAGNOSIS — N10 Acute tubulo-interstitial nephritis: Secondary | ICD-10-CM

## 2015-10-11 DIAGNOSIS — G801 Spastic diplegic cerebral palsy: Secondary | ICD-10-CM

## 2015-10-11 LAB — COMPREHENSIVE METABOLIC PANEL
ALT: 61 U/L — ABNORMAL HIGH (ref 14–54)
ANION GAP: 7 (ref 5–15)
AST: 16 U/L (ref 15–41)
Albumin: 2.1 g/dL — ABNORMAL LOW (ref 3.5–5.0)
Alkaline Phosphatase: 173 U/L — ABNORMAL HIGH (ref 38–126)
BUN: <5 mg/dL — ABNORMAL LOW (ref 6–20)
CHLORIDE: 108 mmol/L (ref 101–111)
CO2: 25 mmol/L (ref 22–32)
Calcium: 7.8 mg/dL — ABNORMAL LOW (ref 8.9–10.3)
Creatinine, Ser: 0.39 mg/dL — ABNORMAL LOW (ref 0.44–1.00)
GFR calc Af Amer: >60 mL/min (ref >60)
Glucose, Bld: 98 mg/dL (ref 65–99)
POTASSIUM: 3.2 mmol/L — AB (ref 3.5–5.1)
SODIUM: 140 mmol/L (ref 135–145)
Total Bilirubin: 0.5 mg/dL (ref 0.3–1.2)
Total Protein: 5.4 g/dL — ABNORMAL LOW (ref 6.5–8.1)

## 2015-10-11 LAB — CBC
HEMATOCRIT: 23.7 % — AB (ref 36.0–46.0)
HEMOGLOBIN: 7.4 g/dL — AB (ref 12.0–15.0)
MCH: 27.9 pg (ref 26.0–34.0)
MCHC: 31.2 g/dL (ref 30.0–36.0)
MCV: 89.4 fL (ref 78.0–100.0)
PLATELETS: 384 10*3/uL (ref 150–400)
RBC: 2.65 MIL/uL — AB (ref 3.87–5.11)
RDW: 15.8 % — ABNORMAL HIGH (ref 11.5–15.5)
WBC: 6 10*3/uL (ref 4.0–10.5)

## 2015-10-11 LAB — URINE CULTURE: Culture: >=100000

## 2015-10-11 MED ORDER — PROMETHAZINE HCL 25 MG/ML IJ SOLN
6.2500 mg | Freq: Four times a day (QID) | INTRAMUSCULAR | Status: DC | PRN
  Administered 2015-10-11: 6.25 mg via INTRAVENOUS
  Filled 2015-10-11: qty 1

## 2015-10-11 MED ORDER — PROMETHAZINE HCL 25 MG PO TABS: 12.5000 mg | ORAL_TABLET | Freq: Four times a day (QID) | ORAL | Status: DC | PRN

## 2015-10-11 MED ORDER — PROMETHAZINE HCL 12.5 MG RE SUPP
12.5000 mg | Freq: Four times a day (QID) | RECTAL | Status: DC | PRN
  Filled 2015-10-11: qty 1

## 2015-10-11 MED ORDER — PROMETHAZINE HCL 25 MG/ML IJ SOLN
6.2500 mg | Freq: Once | INTRAMUSCULAR | Status: AC
  Administered 2015-10-11: 6.25 mg via INTRAVENOUS
  Filled 2015-10-11: qty 1

## 2015-10-11 MED ORDER — PROMETHAZINE HCL 25 MG PO TABS
12.5000 mg | ORAL_TABLET | Freq: Four times a day (QID) | ORAL | Status: DC | PRN
  Administered 2015-10-14 – 2015-10-24 (×6): 12.5 mg via ORAL
  Filled 2015-10-11 (×6): qty 1

## 2015-10-11 MED ORDER — PROMETHAZINE HCL 25 MG/ML IJ SOLN
12.5000 mg | Freq: Four times a day (QID) | INTRAMUSCULAR | Status: DC | PRN
  Administered 2015-10-12 – 2015-10-25 (×9): 12.5 mg via INTRAVENOUS
  Filled 2015-10-11 (×9): qty 1

## 2015-10-11 NOTE — Clinical Social Work Note (Signed)
CSW informed by MD that patient will be ready for discharge on Thursday or Friday.  [facility] to update her and CSW informed that they don't have any beds today, and patient returning will be dependent on bed availability day of discharge.  Visited room and talked with patient and her friend Rennis PettyLee McCraven regarding d/c back to [facility] and possibility of no bed availability day of discharge. At patient's request, her father, Rudell Cobbom Willmann was contacted and updated.  Genelle BalVanessa Carrera Kiesel, MSW, LCSW Licensed Clinical Social Worker Clinical Social Work Department [facility] [phone]

## 2015-10-11 NOTE — Progress Notes (Signed)
Family Medicine Teaching Service Daily Progress Note Intern Pager: [phone]  Patient name: [name] [name] Medical record number: [id number] Date of birth: 07/21/1976 Age: 39 y.o. Gender: female  Primary Care Provider: Caryl Ada, DO Consultants: none Code Status: FULL  Pt Overview and Major Events to Date:  11/6-11/7: Admitted for urosepsis; refractory to 4 litres of fluid with sbp 84 despite low dose levophed. Has been off pressors for most of 11/7. Subprapubic cath changed as well. Started on Vanc and Imipenem  11/8: Transferred to floor from [facility];   Assessment and Plan: Sepsis 2/2 UTI with suprapubic catheter:  On admission, refractory to 4 litres of fluid with sbp 84 despite low dose levophed. Has been off pressors for most of 11/7. Sepsis Resolved.  Febrile at 103.30F ~8pm 11/8 has been afebrile since. Tachycardia overnight up to 131; this seems to occur when patient is febrile. Now HR wnl. WBC wnl. Lactic Acid 11/7: 0.6, Procalcitonin 0.25 - Urine culture: >100,000 colonies Gram Negative Rods (sensitivities pending) - Blood culture 11/6: coagulase negative staph in one bottle (likely contaminant); second bottle NG x 2 days  - continue Imipenem (11/7>); discontinued Vanc (11/7-11/9) due to urine culture results; will wait for sensitivities prior to transitioning to PO.  - Per Up to Date, guidelines from the American Society of Transplantation recommend antibiotic therapy for 14 to 21 days for sepsis associated with a urinary tract infection   Transaminitis: Initially elevated 366/197/229> 190/27/87> 173>16>61 Improving. 11/7:PT: 18.6; aPTT 33; INR1.41.Possibly from sepsis. Additionally, patient notes of Tylenol use for the past few months - will monitor  Normocytic Anemia: 11/8 Hgb 7.0>7.4; on admission 9.5. No active bleeding noted clinically.  - will continue to monitor CBC  Hypokalemia: mild with K of 3.2 11/9. - Kdur BID x 1  Major Depression/Anxiety: - cont home  Klonopin  - cont home Seroquel  GERD: - protonix  Hx PE with hx of anticoagulation: Per patient, had recent vaginal bleeding which was evaluated at [facility] this month; was told to stop taking anticoagulation until outpatient follow up.  - per chart review in care everywhere, patient was [facility] for vaginal bleeding and acute symptomatic anemia in Oct 2016 in the setting of anticoagulation use (xeralto). She was taken to the OR on 09/27/15 for IVC filter placement. At the time of discharge, her Xarelto was held until her follow up appointment in 2 weeks with Dr. Logan Bores and a plan will need to be made regarding anticoagulation and her IVC filter at this appointment - Additionally per chart review, patient's hemoglobin had been unstable after initiation of systemic anticoagulation per urology - will continue to hold Xeralto  Cerebral Palsy - Tizanidine and Baclofen   FEN/GI: normal diet;  PPx: SQ Heparin   Disposition: assisted living facility pending management of complicated UTI ( social work consulted to assist, greatly appreciate)   Subjective:  - patient is doing okay today; feeling better than when she came in  - denies abdominal pain, chills, shortness of breath  - is concerned she will not be able to return to [facility] as her father received a call about this; she may not have a bed available. Social work on board to help with this.  Objective: Temp:  [97.3 F (36.3 C)-103.1 F (39.5 C)] 97.3 F (36.3 C) (11/09 0434) Pulse Rate:  [73-131] 73 (11/09 0434) Resp:  [18-20] 20 (11/09 0434) BP: (108-143)/(46-59) 120/59 mmHg (11/09 0434) SpO2:  [93 %-96 %] 94 % (11/09 0434) Weight:  [  137 lb 9.1 oz (62.4 kg)] 137 lb 9.1 oz (62.4 kg) (11/08 2055) Physical Exam: General: NAD, resting HEENT: Neck contracted to right, left IJ in place;  MMM Cardiovascular: RRR, no m/r/g Respiratory: CTAB bilaterally Abdomen: soft, NT, ND, suprapubic catheter in place without  surrounding erythema  Extremities: warm and dry   Laboratory:  Recent Labs Lab 10/09/15 0500 10/10/15 0455 10/11/15 0415  WBC 7.8 7.7 6.0  HGB 8.3* 7.0* 7.4*  HCT 27.1* 22.7* 23.7*  PLT 388 339 384    Recent Labs Lab 10/08/15 2339 10/10/15 0455 10/11/15 0415  NA 135 135 140  K 3.5 3.5 3.2*  CL 103 106 108  CO2 21* 23 25  BUN 8 <5* <5*  CREATININE 0.56 0.33* 0.39*  CALCIUM 8.3* 7.3* 7.8*  PROT 6.5 5.0* 5.4*  BILITOT 1.9* 0.6 0.5  ALKPHOS 366* 190* 173*  ALT 229* 87* 61*  AST 197* 27 16  GLUCOSE 105* 103* 98    Imaging/Diagnostic Tests: ECHO: EF 65-70%; normal  CXR 11/6: FINDINGS: Chronically low lung volumes. There is no edema, consolidation, effusion, or pneumothorax. Normal heart size and mediastinal contours for technique.  No acute osseous finding. Lower thoracic spinal catheter noted.  IMPRESSION: Negative for pneumonia.  CXR 11/7: FINDINGS: Left IJ central line, tip at the upper right atrium. No evidence of pneumothorax. No new mediastinal widening.  Low lung volumes with perihilar atelectasis. No edema or pneumonia suspected. Normal heart size.  IMPRESSION: 1. Left IJ central line with tip at the upper right atrium. No pneumothorax. 2. Progressive hypoventilation and perihilar atelectasis.  Palma HolterKanishka G Gunadasa, MD 10/11/2015, 6:32 AM PGY-1, [place] [facility] FPTS Intern pager: [phone], text pages welcome

## 2015-10-11 NOTE — Consult Note (Addendum)
[facility]       Reason for Consult:ESBL pyelonephritis    Referring Physician: Dr. Lum Babe  Active Problems:   CP (cerebral palsy), spastic ([name])   Chronic suprapubic catheter ([name])   Shock ([name])   Pressure ulcer   Sepsis ([name])   Septic shock ([name])   . baclofen  20 mg Oral QID  . clonazePAM  0.5 mg Oral 3 times per day  . heparin  5,000 Units Subcutaneous 3 times per day  . imipenem-cilastatin  250 mg Intravenous 4 times per day  . pantoprazole  40 mg Oral QHS  . QUEtiapine  100 mg Oral QHS  . tiZANidine  4 mg Oral 2 times per day  . vancomycin  750 mg Intravenous Q12H    Recommendations: Imipenem or daily ertapenem 7 more days  Stop vancomycin  Assessment: She came in with fever, WBCs and bacteria in the urine, sepsis requiring pressor support c/w pyelonephritis with SIRS.  Culture with ESBL. ESBL organisms require carbapenem coverage, other antibiotics or oral options do not cover this,even when sensitivities suggest otherwise.  Cerebral palsy with contractures and need for suprapubic catheter.  Antibiotics: imipenem  HPI: [name] [name] is a 39 y.o. female with cerebral palsy, chronic suprapubic catheter, HTN, OCD, depression who presented 11/7 with N/V, fever to 103 which had been present about 2 days.  She was in [facility].  She was found to be hypotensive, febrile and required levophed on admission.  She was placed on broad spectrum antibiotics with vancomycin and zosyn and then imipenem.  She improved, had catheter changed and culture now with ESBL.  Also with ESBL culture in October.  Was hospitalized then for vaginal bleeding and transferred to [facility].  Was on Xarelto due to PE.  CXR independently reviewed and no opacity.    Review of Systems:  Constitutional: negative for chills Gastrointestinal: negative for nausea, vomiting and diarrhea All other systems reviewed and are negative   Past Medical History  Diagnosis Date  .  Cerebral palsy ([name])   . Pulmonary embolism ([name])     Lifetime Coumadin  . Cerebral palsy ([name])     Spastic Cerebral palsy, mentally intact  . Contracture, joint, multiple sites     Electric wheelchair, uses left hand to operate chair.   [name] Kitchen Hypertension   . OCD (obsessive compulsive disorder)   . Depression   . Migraine   . [name]   . Dysarthria   . Endometriosis   . History of recurrent UTIs   . GERD (gastroesophageal reflux disease)   . Pulmonary embolism ([name]) 2011, 01/2011    Will be on lifetime coumadin  . Anemia   . Abdominal pain 01/02/2012    Overview:  Overview:  Per Previous pcp note- Dr. Ta--Pt has history of endometriosis and had DUB in 2011.  She She had u/s that showed normal endometrial stripe.  She had endometrial bx that was wnl.  She has a lot of abd cramping every month.  This cramping was sometimes not relieved with hydrocodone.  She was referred to [facility] for IUD placement to help endometriosis and abd cramping.  [name]  . Macroscopic hematuria 03/10/2013    status post replacement of suprapubic tube 03/04/2013   . HYPERTENSION, BENIGN 01/31/2010    Qualifier: Diagnosis of  By: [name], MD, [name] History of endometriosis 10/2012    OBGYN [facility]: Laparoscopic Endometrial Ablation, TVH,  . Gram  positive sepsis ([name]) 10/24/2014  . HCAP (healthcare-associated pneumonia) 10/25/2014  . Abdominal pain 01/02/2012    Overview:  Overview:  Per Previous pcp note- Dr. Ta--Pt has history of endometriosis and had DUB in 2011.  She She had u/s that showed normal endometrial stripe.  She had endometrial bx that was wnl.  She has a lot of abd cramping every month.  This cramping was sometimes not relieved with hydrocodone.  She was referred to [facility] for IUD placement to help endometriosis and abd cramping.  [name] was placed 04/30/11 by [facility].  Pt still complains of abd cramping, which I am treating with hydrocodone and ultram. Pt reports some  initial relief of pain from IUD, but states it has now returned back to similar to previous cramping. Gave patient a shot of Toradol 60 mg IM today-since positive exacerbation of abdominal cramping during past 2 weeks. Physical exam reassuring. Patient may need to return to [facility] for further workup since she is plugged in with the GYN providers there for further treatment options regarding endometriosis and abdominal cramping. The dysfunctional uterine bleeding now well contr  . Acute respiratory failure with hypoxia ([name])   . Cellulitis 12/22/2014  . Disease of female genital organs 10/24/2010    Overview:  Overview:  Pt has history of endometriosis and had DUB in 2011.  She had u/s that showed normal endometrial stripe.  She had endometrial bx that was wnl.  She has a lot of abd cramping every month.  This cramping was sometimes not relieved with hydrocodone.  She was referred to [facility] for IUD placement to help endometriosis and abd cramping.  [name] was placed 04/30/11 by [facility].  Pt still complains of abd cramping, which was treated with hydrocodone and ultram.   Last seen at [facility] by OB/GYN 12/24/11, given Depo Lupron injection.  Ordering CT scan to r/o additional cause of abdominal pain (pt with known endometriosis and will not tolerate vaginal U/S, pain not improved s/p [name] or with Lupron injection-- only caused hot flashes and amenorrhea, no pain relief).  Also made referral to pain clinic for better pain control.  Do not think hysterectomy would help with pain, and concern for contamination of baclofen pump if did proceed with surgery.    8/5/13[name] OB/GYN: Pt was seen by   . Dyspnea and respiratory abnormality 02/04/2011    Overview:  Overview:  Pt has history of recurrent PE and is on chronic coumadin.  Pt has been complaining of dyspnea that she describes as chest tightness.  She has been to the ER multiple times for this.  Usually she gets CTA and serial CE.  She has had at least  6 CTA in a period of since 2011.  She also has an O2 requirement. It is unclear why pt feels dyspneic, complains of chest tightness, or has O2 requirement.  I thought that her complaint may be from deconditioning and referred her to PT.  Unfortunately Medicaid only pays for 4 PT visits.  Pt should be doing these exercises at home, but she has not.  I have referred [facility] to [facility], and she has been seen by [name].  He did not understand the etiology of her dyspnea and O2 requirement.  He will continue to follow her.   Last Assessment & Plan:  Pt presents with concerns for chest tightness and dyspnea.  She was seen in ED yesterday and discharged home.  While there she had a CT chest 04/20/11  showed 1.  Multifocal degradation as detailed above.  N  . Gross hematuria 03/12/2013    Overview:  Overview:  status post replacement of suprapubic tube 03/04/2013  Last Assessment & Plan:  Recent urine culture collected in ED on 5/4 did not result in significant growth. Moreover, patient has been asymptomatic with normal WBC, no fever and no true symptoms making symptomatic UTI less likely.  Will continue to monitor for any sign of infection.  Patient's xarelto had been held during her recent hospitalization due to hematuria. This has since been re-started. Monitor bleeding.    [name] Kitchen. History of anticoagulant therapy 10/21/2012  . Infantile cerebral palsy ([name]) 01/29/2007    Overview:  She has spastic CP.  She is mentally intact.  She is wheelchair bound.  She is wheelchair bound for ambulation      . Intracervical pessary 05/07/2011    Overview:  Overview:  [facility] to treat DUB and Endometriosis.  [facility] but was considered a poor surgical candidate and [facility]  For DUB she had pelvic ultrasound that showed thin stripe and she had endometrial Bx by Dr Jennette KettleNeal in GYN clinic, which was negative.     . Parapneumonic effusion 10/25/2014  . Presence of intrathecal  baclofen pump 03/07/2014    R LQ. Insertion T10.    [name] Kitchen. Psychiatric illness 10/31/2014  . Recurrent pulmonary embolism ([name]) 05/03/2011    Pt has history of recurrent PE and is on chronic coumadin.  Pt has been complaining of dyspnea that she describes as chest tightness.  She has been to the ER multiple times for this.  Usually she gets CTA and serial CE.  She has had at least 6 CTA in a period of since 2011.  She also has an O2 requirement. It is unclear why pt feels dyspneic, complains of chest tightness, or has O2 requirement.  I thought that her complaint may be from deconditioning and referred her to PT.  Unfortunately Medicaid only pays for 4 PT visits.  Pt should be doing these exercises at home, but she has not.  [facility] and she has been seen by Dr Marchelle Gearingamaswamy.  He did not understand the etiology of her dyspnea and O2 requirement.  He will continue to follow her.     . Seborrheic keratosis 01/18/2011  . Spasticity 01/05/2014  . Transient alteration of awareness, recurrent 09/08/2006    Recurrent episodes where [name]  loses contact with her world and that have been studied thoroughly and appear nonepileptic in nature per Dr Terrace ArabiaYan (neurology) The patient has had episodes starting in 2007 that initially began 1-4 times a day during which time she would have periods of unawareness followed by confusion. This continuous video EEG monitoring performed from October 8-12, 2007. Had   . Acute cystitis without hematuria   . Acute cystitis with hematuria   . Urethra dilated and patulous 07/03/2015    Patulous, Dilated Urethra. 5mL balloon on a 22-Fr catheter hoping this would prevent the bladder spasm from pushing the balloon down the urethra (Dr Logan BoresEvans, Urology [facility], July 2016)     Social History  Substance Use Topics  . Smoking status: Never Smoker   . Smokeless tobacco: Never Used  . Alcohol Use: 0.6 oz/week    1 Glasses of wine per week     Comment: Drinks alcohol once a month.     Family History  Problem Relation Age of Onset  . Asthma Father   . Colon cancer Maternal Grandmother     Died in her 56's  . Cancer Paternal Grandmother   . Breast cancer Paternal Grandmother     Died in her 48's  . Heart attack Maternal Grandfather     Died in his 26's  . Alzheimer's disease Paternal Grandfather     Died in his 54's    Allergies  Allergen Reactions  . Morphine Dermatitis and Other (See Comments)    Skin turned red    Physical Exam: Constitutional: in no apparent distress and alert  Filed Vitals:   10/11/15 1222  BP:   Pulse:   Temp: 99.5 F (37.5 C)  Resp:    EYES: anicteric ENMT: left IJ in place, c/d/i Cardiovascular: Cor RRR and No murmurs Respiratory: clear; GI: Bowel sounds are normal, nt Musculoskeletal: contracted UEs Skin: negatives: no rash Hematologic: no cervical lad  Lab Results  Component Value Date   WBC 6.0 10/11/2015   HGB 7.4* 10/11/2015   HCT 23.7* 10/11/2015   MCV 89.4 10/11/2015   PLT 384 10/11/2015    Lab Results  Component Value Date   CREATININE 0.39* 10/11/2015   BUN <5* 10/11/2015   NA 140 10/11/2015   K 3.2* 10/11/2015   CL 108 10/11/2015   CO2 25 10/11/2015    Lab Results  Component Value Date   ALT 61* 10/11/2015   AST 16 10/11/2015   ALKPHOS 173* 10/11/2015     Microbiology: Recent Results (from the past 240 hour(s))  Blood Culture (routine x 2)     Status: None (Preliminary result)   Collection Time: 10/08/15 10:55 PM  Result Value Ref Range Status   Specimen Description BLOOD LEFT HAND  Final   Special Requests BOTTLES DRAWN AEROBIC AND ANAEROBIC 5CC  Final   Culture  Setup Time   Final    GRAM POSITIVE COCCI IN CLUSTERS IN BOTH AEROBIC AND ANAEROBIC BOTTLES CRITICAL RESULT CALLED TO, READ BACK BY AND VERIFIED WITH: L ROBBINS RN 1847 10/09/15 A BROWNING    Culture   Final    STAPHYLOCOCCUS SPECIES (COAGULASE NEGATIVE) THE SIGNIFICANCE OF ISOLATING THIS ORGANISM FROM A SINGLE SET OF BLOOD  CULTURES WHEN MULTIPLE SETS ARE DRAWN IS UNCERTAIN. PLEASE NOTIFY THE MICROBIOLOGY DEPARTMENT WITHIN ONE WEEK IF SPECIATION AND SENSITIVITIES ARE REQUIRED.    Report Status PENDING  Incomplete  Blood Culture (routine x 2)     Status: None (Preliminary result)   Collection Time: 10/08/15 11:05 PM  Result Value Ref Range Status   Specimen Description BLOOD RIGHT HAND  Final   Special Requests BOTTLES DRAWN AEROBIC AND ANAEROBIC 5CC  Final   Culture NO GROWTH 3 DAYS  Final   Report Status PENDING  Incomplete  Urine culture     Status: None   Collection Time: 10/08/15 11:49 PM  Result Value Ref Range Status   Specimen Description URINE, CATHETERIZED  Final   Special Requests NONE  Final   Culture   Final    >=100,000 COLONIES/mL ESCHERICHIA COLI Confirmed Extended Spectrum Beta-Lactamase Producer (ESBL)    Report Status 10/11/2015 FINAL  Final   Organism ID, Bacteria ESCHERICHIA COLI  Final      Susceptibility   Escherichia coli - MIC*    AMPICILLIN >=32 RESISTANT Resistant     CEFAZOLIN >=64 RESISTANT Resistant     CEFTRIAXONE >=64 RESISTANT Resistant     CIPROFLOXACIN >=4 RESISTANT Resistant     GENTAMICIN <=  1 SENSITIVE Sensitive     IMIPENEM <=0.25 SENSITIVE Sensitive     NITROFURANTOIN 64 INTERMEDIATE Intermediate     TRIMETH/SULFA >=320 RESISTANT Resistant     AMPICILLIN/SULBACTAM 16 INTERMEDIATE Intermediate     PIP/TAZO <=4 SENSITIVE Sensitive     * >=100,000 COLONIES/mL ESCHERICHIA COLI  MRSA PCR Screening     Status: None   Collection Time: 10/09/15  4:10 AM  Result Value Ref Range Status   MRSA by PCR NEGATIVE NEGATIVE Final    Comment:        The GeneXpert MRSA Assay (FDA approved for NASAL specimens only), is one component of a comprehensive MRSA colonization surveillance program. It is not intended to diagnose MRSA infection nor to guide or monitor treatment for MRSA infections.     Staci Righter, MD [facility] [email or web address] [phone] pager  [phone] cell 10/11/2015, 2:00 PM

## 2015-10-12 ENCOUNTER — Telehealth: Payer: Self-pay | Admitting: Neurology

## 2015-10-12 DIAGNOSIS — D6489 Other specified anemias: Secondary | ICD-10-CM | POA: Insufficient documentation

## 2015-10-12 LAB — CBC
HEMATOCRIT: 21.7 % — AB (ref 36.0–46.0)
Hemoglobin: 6.7 g/dL — CL (ref 12.0–15.0)
MCH: 27.2 pg (ref 26.0–34.0)
MCHC: 30.9 g/dL (ref 30.0–36.0)
MCV: 88.2 fL (ref 78.0–100.0)
PLATELETS: 354 10*3/uL (ref 150–400)
RBC: 2.46 MIL/uL — ABNORMAL LOW (ref 3.87–5.11)
RDW: 15.7 % — ABNORMAL HIGH (ref 11.5–15.5)
WBC: 6 10*3/uL (ref 4.0–10.5)

## 2015-10-12 LAB — COMPREHENSIVE METABOLIC PANEL
ALBUMIN: 2 g/dL — AB (ref 3.5–5.0)
ALK PHOS: 146 U/L — AB (ref 38–126)
ALT: 42 U/L (ref 14–54)
AST: 11 U/L — AB (ref 15–41)
Anion gap: 5 (ref 5–15)
BILIRUBIN TOTAL: 0.4 mg/dL (ref 0.3–1.2)
CALCIUM: 7.7 mg/dL — AB (ref 8.9–10.3)
CO2: 27 mmol/L (ref 22–32)
Chloride: 108 mmol/L (ref 101–111)
Creatinine, Ser: 0.35 mg/dL — ABNORMAL LOW (ref 0.44–1.00)
GFR calc Af Amer: >60 mL/min (ref >60)
GFR calc non Af Amer: >60 mL/min (ref >60)
GLUCOSE: 119 mg/dL — AB (ref 65–99)
POTASSIUM: 2.9 mmol/L — AB (ref 3.5–5.1)
Sodium: 140 mmol/L (ref 135–145)
TOTAL PROTEIN: 5 g/dL — AB (ref 6.5–8.1)

## 2015-10-12 LAB — BASIC METABOLIC PANEL
Anion gap: 7 (ref 5–15)
CALCIUM: 8 mg/dL — AB (ref 8.9–10.3)
CO2: 25 mmol/L (ref 22–32)
Chloride: 106 mmol/L (ref 101–111)
Creatinine, Ser: 0.37 mg/dL — ABNORMAL LOW (ref 0.44–1.00)
GFR calc Af Amer: >60 mL/min (ref >60)
GLUCOSE: 94 mg/dL (ref 65–99)
Potassium: 4.1 mmol/L (ref 3.5–5.1)
Sodium: 138 mmol/L (ref 135–145)

## 2015-10-12 LAB — HEMOGLOBIN AND HEMATOCRIT, BLOOD
HEMATOCRIT: 25.9 % — AB (ref 36.0–46.0)
HEMOGLOBIN: 8.3 g/dL — AB (ref 12.0–15.0)

## 2015-10-12 LAB — CULTURE, BLOOD (ROUTINE X 2)

## 2015-10-12 LAB — GAMMA GT: GGT: 106 U/L — AB (ref 7–50)

## 2015-10-12 LAB — IRON AND TIBC
Iron: 9 ug/dL — ABNORMAL LOW (ref 28–170)
Saturation Ratios: 4 % — ABNORMAL LOW (ref 10.4–31.8)
TIBC: 241 ug/dL — AB (ref 250–450)
UIBC: 232 ug/dL

## 2015-10-12 LAB — LIPASE, BLOOD: LIPASE: 18 U/L (ref 11–51)

## 2015-10-12 LAB — RETICULOCYTES
RBC.: 2.79 MIL/uL — AB (ref 3.87–5.11)
RETIC COUNT ABSOLUTE: 55.8 10*3/uL (ref 19.0–186.0)
RETIC CT PCT: 2 % (ref 0.4–3.1)

## 2015-10-12 LAB — VITAMIN B12: VITAMIN B 12: 1076 pg/mL — AB (ref 180–914)

## 2015-10-12 LAB — FERRITIN: Ferritin: 58 ng/mL (ref 11–307)

## 2015-10-12 LAB — PREPARE RBC (CROSSMATCH)

## 2015-10-12 LAB — OCCULT BLOOD X 1 CARD TO LAB, STOOL: FECAL OCCULT BLD: NEGATIVE

## 2015-10-12 LAB — FOLATE: Folate: 16.1 ng/mL (ref >5.9)

## 2015-10-12 LAB — GLUCOSE, CAPILLARY: GLUCOSE-CAPILLARY: 170 mg/dL — AB (ref 65–99)

## 2015-10-12 MED ORDER — POTASSIUM CHLORIDE CRYS ER 20 MEQ PO TBCR: 40.0000 meq | EXTENDED_RELEASE_TABLET | Freq: Two times a day (BID) | ORAL | Status: DC

## 2015-10-12 MED ORDER — SODIUM CHLORIDE 0.9 % IV SOLN
1.0000 g | INTRAVENOUS | Status: DC
  Administered 2015-10-13 – 2015-10-19 (×7): 1 g via INTRAVENOUS
  Filled 2015-10-12 (×9): qty 1

## 2015-10-12 MED ORDER — SODIUM CHLORIDE 0.9 % IV SOLN
Freq: Once | INTRAVENOUS | Status: AC
  Administered 2015-10-12: 16:00:00 via INTRAVENOUS

## 2015-10-12 MED ORDER — POTASSIUM CHLORIDE CRYS ER 20 MEQ PO TBCR
40.0000 meq | EXTENDED_RELEASE_TABLET | Freq: Two times a day (BID) | ORAL | Status: DC
  Administered 2015-10-12: 40 meq via ORAL
  Filled 2015-10-12 (×2): qty 2

## 2015-10-12 MED ORDER — PROMETHAZINE HCL 25 MG/ML IJ SOLN
12.5000 mg | Freq: Once | INTRAMUSCULAR | Status: AC
  Administered 2015-10-12: 12.5 mg via INTRAVENOUS
  Filled 2015-10-12: qty 1

## 2015-10-12 NOTE — Progress Notes (Signed)
Patient given 1 unit of RBCs, tolerated well, except she was running low-grade fever all the time, Family medicine doctors aware of the situation.

## 2015-10-12 NOTE — Progress Notes (Signed)
ANTIBIOTIC CONSULT NOTE - FOLLOW UP  Pharmacy Consult for Imipenem Indication: Empiric for rule out sepsis  Allergies  Allergen Reactions  . Morphine Dermatitis and Other (See Comments)    Skin turned red    Patient Measurements: Height: 5' (152.4 cm) Weight: 137 lb 9.1 oz (62.4 kg) IBW/kg (Calculated) : 45.5   Vital Signs: Temp: 98 F (36.7 C) (11/10 0806) Temp Source: Oral (11/10 0806) BP: 134/69 mmHg (11/10 0806) Pulse Rate: 73 (11/10 0806) Intake/Output from previous day: 11/09 0701 - 11/10 0700 In: 1640 [P.O.:840; I.V.:100; IV Piggyback:700] Out: 1442 [Urine:1440; Stool:2] Intake/Output from this shift: Total I/O In: 760 [P.O.:760] Out: -   Labs:  Recent Labs  10/10/15 0455 10/11/15 0415 10/12/15 0440  WBC 7.7 6.0 6.0  HGB 7.0* 7.4* 6.7*  PLT 339 384 354  CREATININE 0.33* 0.39* 0.35*   Estimated Creatinine Clearance: 76.4 mL/min (by C-G formula based on Cr of 0.35). No results for input(s): VANCOTROUGH, VANCOPEAK, VANCORANDOM, GENTTROUGH, GENTPEAK, GENTRANDOM, TOBRATROUGH, TOBRAPEAK, TOBRARND, AMIKACINPEAK, AMIKACINTROU, AMIKACIN in the last 72 hours.   Microbiology: Recent Results (from the past 720 hour(s))  Urine culture     Status: None   Collection Time: 09/24/15  3:18 PM  Result Value Ref Range Status   Specimen Description URINE, RANDOM  Final   Special Requests NONE  Final   Culture   Final    >=100,000 COLONIES/mL ESCHERICHIA COLI Confirmed Extended Spectrum Beta-Lactamase Producer (ESBL)    Report Status 09/27/2015 FINAL  Final   Organism ID, Bacteria ESCHERICHIA COLI  Final      Susceptibility   Escherichia coli - MIC*    AMPICILLIN >=32 RESISTANT Resistant     CEFAZOLIN >=64 RESISTANT Resistant     CEFTRIAXONE >=64 RESISTANT Resistant     CIPROFLOXACIN >=4 RESISTANT Resistant     GENTAMICIN 2 SENSITIVE Sensitive     IMIPENEM <=0.25 SENSITIVE Sensitive     NITROFURANTOIN 128 RESISTANT Resistant     TRIMETH/SULFA >=320 RESISTANT  Resistant     AMPICILLIN/SULBACTAM 16 INTERMEDIATE Intermediate     PIP/TAZO <=4 SENSITIVE Sensitive     * >=100,000 COLONIES/mL ESCHERICHIA COLI  Blood Culture (routine x 2)     Status: None   Collection Time: 10/08/15 10:55 PM  Result Value Ref Range Status   Specimen Description BLOOD LEFT HAND  Final   Special Requests BOTTLES DRAWN AEROBIC AND ANAEROBIC 5CC  Final   Culture  Setup Time   Final    GRAM POSITIVE COCCI IN CLUSTERS IN BOTH AEROBIC AND ANAEROBIC BOTTLES CRITICAL RESULT CALLED TO, READ BACK BY AND VERIFIED WITH: L ROBBINS RN 1847 10/09/15 A BROWNING    Culture   Final    STAPHYLOCOCCUS SPECIES (COAGULASE NEGATIVE) THE SIGNIFICANCE OF ISOLATING THIS ORGANISM FROM A SINGLE SET OF BLOOD CULTURES WHEN MULTIPLE SETS ARE DRAWN IS UNCERTAIN. PLEASE NOTIFY THE MICROBIOLOGY DEPARTMENT WITHIN ONE WEEK IF SPECIATION AND SENSITIVITIES ARE REQUIRED.    Report Status 10/12/2015 FINAL  Final  Blood Culture (routine x 2)     Status: None (Preliminary result)   Collection Time: 10/08/15 11:05 PM  Result Value Ref Range Status   Specimen Description BLOOD RIGHT HAND  Final   Special Requests BOTTLES DRAWN AEROBIC AND ANAEROBIC 5CC  Final   Culture NO GROWTH 4 DAYS  Final   Report Status PENDING  Incomplete  Urine culture     Status: None   Collection Time: 10/08/15 11:49 PM  Result Value Ref Range Status   Specimen Description  URINE, CATHETERIZED  Final   Special Requests NONE  Final   Culture   Final    >=100,000 COLONIES/mL ESCHERICHIA COLI Confirmed Extended Spectrum Beta-Lactamase Producer (ESBL)    Report Status 10/11/2015 FINAL  Final   Organism ID, Bacteria ESCHERICHIA COLI  Final      Susceptibility   Escherichia coli - MIC*    AMPICILLIN >=32 RESISTANT Resistant     CEFAZOLIN >=64 RESISTANT Resistant     CEFTRIAXONE >=64 RESISTANT Resistant     CIPROFLOXACIN >=4 RESISTANT Resistant     GENTAMICIN <=1 SENSITIVE Sensitive     IMIPENEM <=0.25 SENSITIVE Sensitive      NITROFURANTOIN 64 INTERMEDIATE Intermediate     TRIMETH/SULFA >=320 RESISTANT Resistant     AMPICILLIN/SULBACTAM 16 INTERMEDIATE Intermediate     PIP/TAZO <=4 SENSITIVE Sensitive     * >=100,000 COLONIES/mL ESCHERICHIA COLI  MRSA PCR Screening     Status: None   Collection Time: 10/09/15  4:10 AM  Result Value Ref Range Status   MRSA by PCR NEGATIVE NEGATIVE Final    Comment:        The GeneXpert MRSA Assay (FDA approved for NASAL specimens only), is one component of a comprehensive MRSA colonization surveillance program. It is not intended to diagnose MRSA infection nor to guide or monitor treatment for MRSA infections.     Anti-infectives    Start     Dose/Rate Route Frequency Ordered Stop   10/13/15 0600  ertapenem (INVANZ) 1 g in sodium chloride 0.9 % 50 mL IVPB     1 g 100 mL/hr over 30 Minutes Intravenous Every 24 hours 10/12/15 1519 10/23/15 0559   10/09/15 2000  vancomycin (VANCOCIN) IVPB 750 mg/150 ml premix  Status:  Discontinued     750 mg 150 mL/hr over 60 Minutes Intravenous Every 12 hours 10/09/15 1134 10/11/15 1427   10/09/15 1200  imipenem-cilastatin (PRIMAXIN) 250 mg in sodium chloride 0.9 % 100 mL IVPB     250 mg 200 mL/hr over 30 Minutes Intravenous 4 times per day 10/09/15 1134 10/13/15 0559   10/09/15 1100  vancomycin (VANCOCIN) 500 mg in sodium chloride 0.9 % 100 mL IVPB  Status:  Discontinued     500 mg 100 mL/hr over 60 Minutes Intravenous Every 8 hours 10/09/15 0229 10/09/15 0429   10/09/15 1000  piperacillin-tazobactam (ZOSYN) IVPB 3.375 g  Status:  Discontinued     3.375 g 12.5 mL/hr over 240 Minutes Intravenous Every 8 hours 10/09/15 0229 10/09/15 0428   10/09/15 0800  piperacillin-tazobactam (ZOSYN) IVPB 3.375 g  Status:  Discontinued     3.375 g 12.5 mL/hr over 240 Minutes Intravenous Every 8 hours 10/09/15 0428 10/09/15 0920   10/09/15 0800  vancomycin (VANCOCIN) 500 mg in sodium chloride 0.9 % 100 mL IVPB  Status:  Discontinued     500  mg 100 mL/hr over 60 Minutes Intravenous Every 8 hours 10/09/15 0429 10/09/15 1134   10/09/15 0230  piperacillin-tazobactam (ZOSYN) IVPB 3.375 g  Status:  Discontinued     3.375 g 12.5 mL/hr over 240 Minutes Intravenous Every 8 hours 10/09/15 0221 10/09/15 0428   10/09/15 0230  vancomycin (VANCOCIN) IVPB 1000 mg/200 mL premix  Status:  Discontinued     1,000 mg 200 mL/hr over 60 Minutes Intravenous  Once 10/09/15 0221 10/09/15 0427   10/09/15 0045  piperacillin-tazobactam (ZOSYN) IVPB 3.375 g     3.375 g 100 mL/hr over 30 Minutes Intravenous  Once 10/09/15 0032 10/09/15 0217   10/09/15  0045  vancomycin (VANCOCIN) IVPB 1000 mg/200 mL premix     1,000 mg 200 mL/hr over 60 Minutes Intravenous  Once 10/09/15 0032 10/09/15 0243   10/09/15 0030  cefTRIAXone (ROCEPHIN) 1 g in dextrose 5 % 50 mL IVPB     1 g 100 mL/hr over 30 Minutes Intravenous  Once 10/09/15 0018 10/09/15 0107      Assessment: 39 YOF from [facility] with extensive cerebral palsy and hx of recurrent UTIs who presented on 11/6 with 2 days of nonbloody N/V and fever, admitted for urosepsis. PMH includes ESBL E. Coli UTI.   Urine culture from 11/6 grew ESBL E. Coli. Resistant to PO abx options; sensitive to imipenem  ID consulted, recommended if treatment is for pyelonephritis, patient should complete total 14 day course of appropriate antibiotics, Imipenem (250mg  IV q6h) or daily ertapenem (1gm IV q24h)  WBC - WNL, Tmax & Tc = 102.7. SCr 0.35. Scr may be falsely elevated in the setting of low muscle mass.  Estimate CrCl 50-70 ml/min)   11/6 BCx2>> 1/2 GPC clusters > Staph coag neg (likely contaminant); second bottle NG x 2 days 11/6 UCx>> E. Coli ESBL. sensitive to imipenem , resistant to other abx 11/7 MRSA pcr -negative 11/7 Strep UA - negative     Goal of Therapy:  Resolution of infection   Plan:  Continue Imipenem 250mg  IV q6h until AM.  Tomorrow AM , MD is changing antibiotic to Ertapenem 1gm IV q24h.      Thank you for allowing pharmacy to be part of this patients care team. Noah Delaineuth Elfida Shimada, RPh Clinical Pharmacist Pager: [phone] 10/12/2015,4:23 PM

## 2015-10-12 NOTE — Progress Notes (Signed)
Family Medicine Teaching Service Daily Progress Note Intern Pager: [phone]  Patient name: [name] SlickerJudith [name] Medical record number: [id number] Date of birth: 1976/01/16 Age: 39 y.o. Gender: female  Primary Care Provider: Caryl AdaJazma Phelps, DO Consultants: none Code Status: FULL  Pt Overview and Major Events to Date:  11/6-11/7: Admitted for urosepsis; refractory to 4 litres of fluid with sbp 84 despite low dose levophed. Has been off pressors for most of 11/7. Subprapubic cath changed as well. Started on Vanc and Imipenem  11/8: Transferred to floor from CCM 11/9: discontinued Vanc due to urine cultures showing ESBL and no growth with blood cultures   Assessment and Plan:  Nausea/Emesis: 9/10 had emesis no blood; bilious. Intermittent nausea since 11/4 but no emesis. Not associated with eating. But notes of not being able to eat as usual due to nausea. "Little" tenderness to palpation of RUQ later this morning (new today). Negative Murphy sign. RUQ US 11/7 done while in ICU notes cholelithiasis with indeterminate presence of acute cholecystitis given conflicting findings (there is gallbladder distention and mild wall thickening but no focal tenderness). HIDA scan could evaluate cystic duct patency.   - Zofran PRN, Phenergan 12.5mg  PRN  - given extra dose of Phenergan 12.5mg  once - will discuss with GI regarding possible further imaging to determine if symptoms are due to acute cholecystitis    Pyelonephritis/Sepsis 2/2 UTI with suprapubic catheter: Sepsis Resolved. On admission, refractory to 4 L of fluid with sbp 84 despite low dose levophed. Has been off pressors for most of 11/7. Lactic Acid 11/7: 0.6 and Procalcitonin 0.25. Febrile overnight with tmax 102.2 around midnight, along with tachycardia up to 142; vitals stable this morning. No other sites for possible infection on exam; Does have stage 1 decubitus ulcer (erythema of skin without skin breakdown).  - Urine culture: >100,000 colonies Gram  Negative Rods; resistant to PO abx options; sensitive to imipenem  - ID consulted: Spoke to ID today, who recommended that if treatment is for pyelonephritis, patient should complete total 14 day course of appropriate antibiotics; fever spikes is normal with pyelonephritis (can have this for about 4-5 days);  - Blood culture 11/6: coagulase negative staph in one bottle (likely contaminant); second bottle NG x 2 days  - continue Imipenem (11/7>); discontinued Vanc (11/7-11/9)  - consider reculture blood and urine if patient spikes fever again   Transaminitis: Initially elevated 366/197/229> 190/27/87> 173/16/61 > 146/11/42. Improving. 11/7:PT: 18.6; aPTT 33; INR1.41. Initial elevation likely from sepsis/shock liver.  - will monitor  Normocytic Anemia: 11/8 Hgb 7.0>7.4 > 6.7 (11/10); on admission 9.5. No active bleeding noted clinically. FOBT negative. Per chart review, iron studies from 03/2014 note of iron deficiency anemia: Fe 18, TIBC 347, Saturation ratio5%, Ferritin 7)  - 1 unit pRBC ordered for transfusion  - check post transfusion H/H - will hold off repeating iron studies for know since patient received transfusion today   Hypokalemia: mild with K of 3.2 > 2.8 11/10. - Kdur 40mEq BID x 1 11/10  Major Depression/Anxiety: - cont home Klonopin  - cont home Seroquel  GERD: - protonix  Stage 1 Decubitus Ulcer: erythema of skin but no skin breakdown. No tenderness.  - prophylaxis with dressing per nursing   Hx PE with hx of anticoagulation: Per patient, had recent vaginal bleeding which was [facility] this month; was told to stop taking anticoagulation until outpatient follow up.  - per chart review in care everywhere, [facility] and  acute symptomatic anemia in Oct 2016 in the setting of anticoagulation use (xeralto). She was taken to the OR on 09/27/15 for IVC filter placement. At the time of discharge, her Xarelto was held  until her follow up appointment in 2 weeks with [name] [name] and a plan will need to be made regarding anticoagulation and her IVC filter at this appointment - Additionally per chart review, patient's hemoglobin had been unstable after initiation of systemic anticoagulation per urology - will continue to hold Xeralto  Cerebral Palsy - Tizanidine and Baclofen   FEN/GI: normal diet;  PPx: SQ Heparin   Disposition: assisted living facility pending management of complicated UTI ( social work consulted to assist, greatly appreciate)   Subjective:  - noted of nausea overnight not relieved by zofran; was given phenergan  - had emesis this morning, bilious  - notes of nausea since 11/4; not associated with food; sometimes associated with fevers - notes has not been able to eat as usual because eating causes or worsens nausea - no abdominal pain  Objective: Temp:  [98.5 F (36.9 C)-102.2 F (39 C)] 98.5 F (36.9 C) (11/10 0602) Pulse Rate:  [63-142] 63 (11/10 0602) Resp:  [8-28] 20 (11/10 0602) BP: (111-146)/(57-75) 128/69 mmHg (11/10 0602) SpO2:  [92 %-95 %] 92 % (11/10 0602) Physical Exam: General: NAD, resting HEENT: Neck contracted to right, left IJ in place;  MMM Cardiovascular: RRR, no m/r/g Respiratory: CTAB bilaterally Abdomen: soft, NT, ND, suprapubic catheter in place without surrounding erythema (Later this morning patient notes of slight discomfort with palpation of RUQ; negative murphy sign)  Extremities: warm and dry  Back: stage 1 decubitus ulcer  Laboratory:  Recent Labs Lab 10/10/15 0455 10/11/15 0415 10/12/15 0440  WBC 7.7 6.0 6.0  HGB 7.0* 7.4* 6.7*  HCT 22.7* 23.7* 21.7*  PLT 339 384 354    Recent Labs Lab 10/10/15 0455 10/11/15 0415 10/12/15 0440  NA 135 140 140  K 3.5 3.2* 2.9*  CL 106 108 108  CO2 BUN <5* <5* <5*  CREATININE 0.33* 0.39* 0.35*  CALCIUM 7.3* 7.8* 7.7*  PROT 5.0* 5.4* 5.0*  BILITOT 0.6 0.5 0.4  ALKPHOS 190* 173* 146*   ALT 87* 61* 42  AST 27 16 11*  GLUCOSE 103* 98 119*    Imaging/Diagnostic Tests: ECHO: EF 65-70%; normal  CXR 11/6: FINDINGS: Chronically low lung volumes. There is no edema, consolidation, effusion, or pneumothorax. Normal heart size and mediastinal contours for technique.  No acute osseous finding. Lower thoracic spinal catheter noted.  IMPRESSION: Negative for pneumonia.  CXR 11/7: FINDINGS: Left IJ central line, tip at the upper right atrium. No evidence of pneumothorax. No new mediastinal widening.  Low lung volumes with perihilar atelectasis. No edema or pneumonia suspected. Normal heart size.  IMPRESSION: 1. Left IJ central line with tip at the upper right atrium. No pneumothorax. 2. Progressive hypoventilation and perihilar atelectasis.  Palma Holter, MD 10/12/2015, 6:28 AM PGY-1, [place] [facility] FPTS Intern pager: [phone], text pages welcome

## 2015-10-12 NOTE — Progress Notes (Signed)
FPTS Interim Progress Note  Treatment plan update:  # RUQ Abdominal Pain / Nausea / Emesis Assessment: - Patient with some symptoms of mild RUQ abdominal pain and nausea with vomiting x 1 (greenish), described that she had some nausea since last Friday almost a week now. - Recent course with 11/7 on admit to ICU had RUQ [place] for some pain, found cholelithiasis and gb wall thickening but uncertain acute cholecystitis, no dilated bile duct. Initial labs with elevated alk phos to 300s, LFTs up, bili up to 2, since both trended down, LFTs and bili normalized, and alk phos in 140s. - Due to recurrent fevers, despite appropriate antibiotics, were investigating alternative sources. However ID advised that may have recurrent fevers in pyelo.  Plan: 1. Called [facility] to review case as curbside, not formal consult. Recommended to pursue imaging with MRCP for biliary evaluation, rule out biliary sludge, choledocholithiasis. Added Lipase and GGT.  # ESBL E Coli Pyelonephritis, in setting of suprapubic catheter, s/p severe urosepsis (resolved) - Changed antibiotics from Imipenem to Ertapenem 1g IV daily (start 0600 tomorrow, last dose 11/20) x total 14 appropriate antibiotic therapy  # Chronic anemia, normocytic, with history of IDA: - Prior work-up with anemia panel showing significant IDA in 2015. No prior record of colonoscopy. FOBT currently negative. No active bleeding. - DC SQ heparin vte prophylaxis >> start SCDs - Re-check anemia panel today prior to transfusion - Hgb 6.7 >> transfusing 1u PRBC this afternoon, follow-up [place], DO 10/12/2015, 5:57 PM PGY-3, [place] Medicine Service pager [phone]

## 2015-10-12 NOTE — Telephone Encounter (Signed)
Patient is still in still hospital . Patient has Medicare and medicaid  . Dr.Yan please advise Is it ok [facility] ? We don't have her medicare card on  File. Called patient and she Relayed to me.  For her Botox Injections.

## 2015-10-12 NOTE — Progress Notes (Signed)
CRITICAL VALUE ALERT  Critical value received:  Hgb 6.7  Date of notification:  10/12/15  Time of notification:  0609  Critical value read back:Yes.    Nurse who received alert:  Feliciana RossettiLaura Kacper Cartlidge, RN  MD notified (1st page):  Providence CrosbyK. Gunadasa, MD  Time of first page:  [phone]  MD notified (2nd page):  Time of second page:  Responding MD:  Providence CrosbyK. Gunadasa, MD  Time MD responded:  [phone]

## 2015-10-13 ENCOUNTER — Inpatient Hospital Stay (HOSPITAL_COMMUNITY): Payer: Medicare Other

## 2015-10-13 ENCOUNTER — Encounter (HOSPITAL_COMMUNITY): Payer: Self-pay | Admitting: Radiology

## 2015-10-13 DIAGNOSIS — N390 Urinary tract infection, site not specified: Secondary | ICD-10-CM

## 2015-10-13 DIAGNOSIS — R319 Hematuria, unspecified: Secondary | ICD-10-CM

## 2015-10-13 DIAGNOSIS — D6489 Other specified anemias: Secondary | ICD-10-CM

## 2015-10-13 DIAGNOSIS — R10811 Right upper quadrant abdominal tenderness: Secondary | ICD-10-CM

## 2015-10-13 DIAGNOSIS — L8990 Pressure ulcer of unspecified site, unspecified stage: Secondary | ICD-10-CM

## 2015-10-13 DIAGNOSIS — Z9359 Other cystostomy status: Secondary | ICD-10-CM

## 2015-10-13 DIAGNOSIS — A4151 Sepsis due to Escherichia coli [E. coli]: Secondary | ICD-10-CM

## 2015-10-13 LAB — CBC
HCT: 26.4 % — ABNORMAL LOW (ref 36.0–46.0)
Hemoglobin: 8.2 g/dL — ABNORMAL LOW (ref 12.0–15.0)
MCH: 27.2 pg (ref 26.0–34.0)
MCHC: 31.1 g/dL (ref 30.0–36.0)
MCV: 87.7 fL (ref 78.0–100.0)
Platelets: 378 K/uL (ref 150–400)
RBC: 3.01 MIL/uL — ABNORMAL LOW (ref 3.87–5.11)
RDW: 15.4 % (ref 11.5–15.5)
WBC: 8.4 K/uL (ref 4.0–10.5)

## 2015-10-13 LAB — COMPREHENSIVE METABOLIC PANEL WITH GFR
ALT: 35 U/L (ref 14–54)
AST: 15 U/L (ref 15–41)
Albumin: 2.3 g/dL — ABNORMAL LOW (ref 3.5–5.0)
Alkaline Phosphatase: 144 U/L — ABNORMAL HIGH (ref 38–126)
Anion gap: 7 (ref 5–15)
BUN: <5 mg/dL — ABNORMAL LOW (ref 6–20)
CO2: 26 mmol/L (ref 22–32)
Calcium: 8 mg/dL — ABNORMAL LOW (ref 8.9–10.3)
Chloride: 104 mmol/L (ref 101–111)
Creatinine, Ser: 0.44 mg/dL (ref 0.44–1.00)
GFR calc Af Amer: >60 mL/min (ref >60)
GFR calc non Af Amer: >60 mL/min (ref >60)
Glucose, Bld: 109 mg/dL — ABNORMAL HIGH (ref 65–99)
Potassium: 3.6 mmol/L (ref 3.5–5.1)
Sodium: 137 mmol/L (ref 135–145)
Total Bilirubin: 1.1 mg/dL (ref 0.3–1.2)
Total Protein: 5.4 g/dL — ABNORMAL LOW (ref 6.5–8.1)

## 2015-10-13 LAB — CULTURE, BLOOD (ROUTINE X 2): CULTURE: NO GROWTH

## 2015-10-13 LAB — TYPE AND SCREEN
ABO/RH(D): A POS
Antibody Screen: NEGATIVE
Unit division: 0

## 2015-10-13 MED ORDER — SODIUM CHLORIDE 0.9 % IV SOLN
510.0000 mg | Freq: Once | INTRAVENOUS | Status: AC
  Administered 2015-10-13: 510 mg via INTRAVENOUS
  Filled 2015-10-13: qty 17

## 2015-10-13 MED ORDER — SODIUM CHLORIDE 0.9 % IV SOLN
INTRAVENOUS | Status: DC
  Administered 2015-10-13 – 2015-10-14 (×2): via INTRAVENOUS

## 2015-10-13 MED ORDER — IOHEXOL 300 MG/ML  SOLN
80.0000 mL | Freq: Once | INTRAMUSCULAR | Status: AC | PRN
  Administered 2015-10-13: 80 mL via INTRAVENOUS

## 2015-10-13 NOTE — Care Management Note (Signed)
Case Management Note  Patient Details  Name: [name] [name] MRN: [id number] Date of Birth: 08/18/1976  Subjective/Objective:           CM continuing to follow for progression and d/c planning.         Action/Plan: 10/12/2014 Met with pt , IM given and explained. Noted that pt has continues to have fever last PM.   Expected Discharge Date:     10/16/2015             Expected Discharge Plan:  [place]  In-House Referral:  Clinical Social Work  Discharge planning Services  CM Consult  Post Acute Care Choice:    Choice offered to:     DME Arranged:    DME Agency:     HH Arranged:    [place] Agency:     Status of Service:  In process, will continue to follow  Medicare Important Message Given:  Yes Date Medicare IM Given:    Medicare IM give by:    Date Additional Medicare IM Given:    Additional Medicare Important Message give by:     If discussed at [place] of Stay Meetings, dates discussed:    Additional Comments:  Adron Bene, RN 10/13/2015, 10:47 AM

## 2015-10-13 NOTE — Progress Notes (Signed)
. Family Medicine Teaching Service Daily Progress Note Intern Pager: [phone]  Patient name: [name] SlickerJudith [name] Medical record number: [id number] Date of birth: [birth year] Age: 39 y.o. Gender: female  Primary Care Provider: Caryl AdaJazma Phelps, DO Consultants: none Code Status: FULL  Pt Overview and Major Events to Date:  11/6-11/7: Admitted for urosepsis; refractory to 4 litres of fluid with sbp 84 despite low dose levophed. Has been off pressors for most of 11/7. Subprapubic cath changed as well. Started on Vanc and Imipenem  11/8: Transferred to floor from CCM 11/9: discontinued Vanc due to urine cultures showing ESBL and no growth with blood cultures  11/10: hgb 6.7; no active bleeding; transfuse 1 unit pRBC  11/11: Abx switched to Ertapenem; MRCP likely today   Assessment and Plan:  Nausea/Emesis/RUQ discomfort: 9/10 had emesis no blood; bilious. Intermittent nausea since 11/4 but no emesis. Not associated with eating. But notes of not being able to eat as usual due to nausea. "Little" tenderness to palpation of RUQ later this morning (new today). Negative Murphy sign. RUQ US 11/7 done while in ICU notes cholelithiasis with indeterminate presence of acute cholecystitis given conflicting findings (there is gallbladder distention and mild wall thickening but no focal tenderness). - Zofran PRN, Phenergan PRN  - Team called [facility] to review case: order MRCP for biliary evaluation, rule out biliary sludge, choledocholithiasis.  - GGT 106 (H); Lipase 18 (nml) - started gentle IVF (75ml); decreased PO intake   Pyelonephritis/Sepsis 2/2 UTI with suprapubic catheter: Sepsis Resolved. On admission, refractory to 4 L of fluid with sbp 84 despite low dose levophed. Has been off pressors for most of 11/7. Lactic Acid 11/7: 0.6 and Procalcitonin 0.25. Febrile overnight with tmax 102.2 around midnight, along with tachycardia up to 142; vitals stable this morning. No other sites for possible infection on  exam; Does have stage 1 decubitus ulcer (erythema of skin without skin breakdown).  - Urine culture: >100,000 colonies Gram Negative Rods; resistant to PO abx options; sensitive to imipenem  - ID consulted: Spoke to ID 11/10, who recommended that if treatment is for pyelonephritis, patient should complete total 14 day course of appropriate antibiotics; fever spikes is normal with pyelonephritis (can have this for about 4-5 days);  - Blood culture 11/6: coagulase negative staph in one bottle (likely contaminant); second bottle NG x 2 days  - Ertapenem (11/11) started due to less frequent administration;  Imipenem (11/7>11/10); discontinued Vanc (11/7-11/9)  - consider reculture blood and urine if patient spikes fever again  - CT Pelvis today: patient notes of new suprapubic tenderness and pain with movement of suprapubic catheter   Transaminitis: Initially elevated 366/197/229> 190/27/87> 173/16/61 > 146/11/42 > 144/15/35. Overall improving. 11/7:PT: 18.6; aPTT 33; INR1.41. Initial elevation likely from sepsis/shock liver.   -  Note plan above for abdominal pain/nausea/emesis  Normocytic Anemia: 11/8 Hgb 7.0>7.4 > 6.7 (11/10) > 8.2 ; on admission 9.5. No active bleeding noted clinically. FOBT negative. Per chart review, iron studies from 03/2014 note of iron deficiency anemia: Fe 18, TIBC 347, Saturation ratio5%, Ferritin 7)  - 1 unit pRBC ordered for transfusion 11/10 with  Post- TF HH 8.3 - anemia studies: Ferritin 58; Fe 9; TIBC 241; saturation ratio 4%; folate 16.1; Vit B12 1076 (H) - IV iron today   Hypokalemia: mild with K of 3.2 > 2.8 11/10 > 3.6. - Kdur 40mEq  x 1 11/10 - resolved  Major Depression/Anxiety: - cont home Klonopin  - cont home Seroquel  GERD: - protonix  Stage 1 Decubitus Ulcer: erythema of skin but no skin breakdown. No tenderness.  - prophylaxis with dressing per nursing   Hx PE with hx of anticoagulation: Per patient, had recent vaginal bleeding which was  evaluated at [name] this month; was told to stop taking anticoagulation until outpatient follow up.  - per chart review in care everywhere, patient was admitted to [facility] for vaginal bleeding and acute symptomatic anemia in Oct 2016 in the setting of anticoagulation use (xeralto). She was taken to the OR on 09/27/15 for IVC filter placement. At the time of discharge, her Xarelto was held until her follow up appointment in 2 weeks with Dr. Logan Bores and a plan will need to be made regarding anticoagulation and her IVC filter at this appointment - Additionally per chart review, patient's hemoglobin had been unstable after initiation of systemic anticoagulation per urology - will continue to hold Xeralto  Cerebral Palsy - Tizanidine and Baclofen   FEN/GI: normal diet;  PPx: SQ Heparin   Disposition: assisted living facility pending management of complicated UTI ( social work consulted to assist, greatly appreciate)   Subjective:  - Continued to spike fevers but to 102.7 overnight, associated with tachycardia in 133. Blood pressures were stable  - no abdominal pain; nausea is present but improved  - not eating much, but drinking some fluids  Objective: Temp:  [98 F (36.7 C)-102.7 F (39.3 C)] 101.3 F (38.5 C) (11/10 2100) Pulse Rate:  [73-133] 110 (11/10 2100) Resp:  [18-20] 18 (11/10 2100) BP: (111-156)/(54-115) 128/66 mmHg (11/10 2100) SpO2:  [93 %-100 %] 93 % (11/10 2100) Physical Exam: General: NAD, resting HEENT: Neck contracted to right, left IJ in place;  MMM Cardiovascular: RRR, no m/r/g Respiratory: CTAB bilaterally Abdomen: soft, ND;  suprapubic catheter in place without surrounding erythema; discomfort with palpation of RUQ and now suprapubic area; no rebound tenderness; pain with moving suprapubic catheter.  Extremities: warm and dry  Back: stage 1 decubitus ulcer  Laboratory:  Recent Labs Lab 10/11/15 0415 10/12/15 0440 10/12/15 2215 10/13/15 0325  WBC  6.0 6.0  --  8.4  HGB 7.4* 6.7* 8.3* 8.2*  HCT 23.7* 21.7* 25.9* 26.4*  PLT 384 354  --  378    Recent Labs Lab 10/11/15 0415 10/12/15 0440 10/12/15 1525 10/13/15 0325  NA 140 140 138 137  K 3.2* 2.9* 4.1 3.6  CL 108 108 106 104  CO2 BUN <5* <5* <5* <5*  CREATININE 0.39* 0.35* 0.37* 0.44  CALCIUM 7.8* 7.7* 8.0* 8.0*  PROT 5.4* 5.0*  --  5.4*  BILITOT 0.5 0.4  --  1.1  ALKPHOS 173* 146*  --  144*  ALT 61* 42  --  35  AST 16 11*  --  15  GLUCOSE 98 119* 94 109*    Imaging/Diagnostic Tests: ECHO: EF 65-70%; normal  CXR 11/6: FINDINGS: Chronically low lung volumes. There is no edema, consolidation, effusion, or pneumothorax. Normal heart size and mediastinal contours for technique.  No acute osseous finding. Lower thoracic spinal catheter noted.  IMPRESSION: Negative for pneumonia.  CXR 11/7: FINDINGS: Left IJ central line, tip at the upper right atrium. No evidence of pneumothorax. No new mediastinal widening.  Low lung volumes with perihilar atelectasis. No edema or pneumonia suspected. Normal heart size.  IMPRESSION: 1. Left IJ central line with tip at the upper right atrium. No pneumothorax. 2. Progressive hypoventilation and perihilar atelectasis.  Palma Holter, MD 10/13/2015, 6:41 AM PGY-1, [facility] Intern pager: [phone], text pages welcome

## 2015-10-13 NOTE — Care Management Important Message (Signed)
Important Message  Patient Details  Name: [name] SlickerJudith [name] MRN: [id number] Date of Birth: [birth year]   Medicare Important Message Given:  Yes    Johannes Everage, Annamarie Majorheryl U, RN 10/13/2015, 10:47 AM

## 2015-10-13 NOTE — Progress Notes (Signed)
Request was made for pt to have an MRI/MRCP.  Pt has implanted baclofen pump.  Reading the product information, pt implant is conditional.  Several things play in this scenario.  Number one, the implant is large and lies right below her liver.  Number two, product safety info recommends keeping the SAR below 1W/kg which would be very difficult on most sequences.  Also the implant is metallic and quite large and lays right underneath the patients liver laying just anterior to the ducts that are requested to be imaged.  Product manual states that the metallic bloom from this implant could span 20-30cm, obliterating anatomy for that large of an area directly around it.  While this does not cause concern for anatomy such as brain and Cspine and lower extremities, it is a situation which makes imaging of the biliary system impossible with the pump in place.  This case was reviewed and discussed with Dr Jeronimo GreavesKyle Talbot, staff body radiologist and recommended alternative imaging in this case.

## 2015-10-13 NOTE — Clinical Social Work Note (Addendum)
[name] not medically stable for discharge today as she continues to spike fevers. CSW contacted [facility] director at [facility], [name] [name] regarding bed availability over the weekend and she will have no availability this weekend.  Contact made with [facility] regarding [facility] and they will review clinicals and get back with CSW.  Clinical information transmitted today to [facility], [facility] and [facility].  CSW followed up with patient with bed 2 bed offers: [facility]. After talking with patient and her father, [name].  Admissions staff person, [name] [name] contacted and will be able to take patient if she is medically stable over the weekend. [name] [name] asked to be called on her mobile phone [phone]).  [name] BalVanessa Amaryllis [name], MSW, LCSW Licensed Clinical Social Worker Clinical Social Work Department [facility] [phone]

## 2015-10-14 ENCOUNTER — Encounter (HOSPITAL_COMMUNITY): Payer: Self-pay | Admitting: Anesthesiology

## 2015-10-14 ENCOUNTER — Inpatient Hospital Stay (HOSPITAL_COMMUNITY): Payer: Medicare Other | Admitting: Anesthesiology

## 2015-10-14 ENCOUNTER — Encounter (HOSPITAL_COMMUNITY)
Admission: EM | Disposition: A | Discharge status: Skilled Nursing Facility | Payer: Self-pay | Source: Home / Self Care | Attending: Family Medicine

## 2015-10-14 DIAGNOSIS — K812 Acute cholecystitis with chronic cholecystitis: Secondary | ICD-10-CM

## 2015-10-14 DIAGNOSIS — K810 Acute cholecystitis: Secondary | ICD-10-CM

## 2015-10-14 DIAGNOSIS — R1011 Right upper quadrant pain: Secondary | ICD-10-CM

## 2015-10-14 HISTORY — PX: CHOLECYSTECTOMY: SHX55

## 2015-10-14 LAB — COMPREHENSIVE METABOLIC PANEL
ALT: 29 U/L (ref 14–54)
AST: 21 U/L (ref 15–41)
Albumin: 2.1 g/dL — ABNORMAL LOW (ref 3.5–5.0)
Alkaline Phosphatase: 127 U/L — ABNORMAL HIGH (ref 38–126)
Anion gap: 6 (ref 5–15)
BILIRUBIN TOTAL: 0.6 mg/dL (ref 0.3–1.2)
CO2: 28 mmol/L (ref 22–32)
Calcium: 8.1 mg/dL — ABNORMAL LOW (ref 8.9–10.3)
Chloride: 105 mmol/L (ref 101–111)
Creatinine, Ser: 0.39 mg/dL — ABNORMAL LOW (ref 0.44–1.00)
GFR calc Af Amer: >60 mL/min (ref >60)
Glucose, Bld: 86 mg/dL (ref 65–99)
POTASSIUM: 3 mmol/L — AB (ref 3.5–5.1)
Sodium: 139 mmol/L (ref 135–145)
TOTAL PROTEIN: 5.2 g/dL — AB (ref 6.5–8.1)

## 2015-10-14 LAB — CBC
HEMATOCRIT: 25.3 % — AB (ref 36.0–46.0)
Hemoglobin: 7.8 g/dL — ABNORMAL LOW (ref 12.0–15.0)
MCH: 27.1 pg (ref 26.0–34.0)
MCHC: 30.8 g/dL (ref 30.0–36.0)
MCV: 87.8 fL (ref 78.0–100.0)
PLATELETS: 344 10*3/uL (ref 150–400)
RBC: 2.88 MIL/uL — ABNORMAL LOW (ref 3.87–5.11)
RDW: 15.7 % — AB (ref 11.5–15.5)
WBC: 6.9 10*3/uL (ref 4.0–10.5)

## 2015-10-14 SURGERY — LAPAROSCOPIC CHOLECYSTECTOMY
Anesthesia: General | Site: Abdomen

## 2015-10-14 MED ORDER — NEOSTIGMINE METHYLSULFATE 10 MG/10ML IV SOLN
INTRAVENOUS | Status: AC
  Filled 2015-10-14: qty 1

## 2015-10-14 MED ORDER — LIDOCAINE HCL (CARDIAC) 20 MG/ML IV SOLN
INTRAVENOUS | Status: AC
  Filled 2015-10-14: qty 10

## 2015-10-14 MED ORDER — DIPHENHYDRAMINE HCL 50 MG/ML IJ SOLN
25.0000 mg | Freq: Four times a day (QID) | INTRAMUSCULAR | Status: DC | PRN
  Administered 2015-10-16 (×2): 25 mg via INTRAVENOUS
  Filled 2015-10-14 (×2): qty 1

## 2015-10-14 MED ORDER — 0.9 % SODIUM CHLORIDE (POUR BTL) OPTIME
TOPICAL | Status: DC | PRN
  Administered 2015-10-14: 1000 mL

## 2015-10-14 MED ORDER — HYDROMORPHONE HCL 1 MG/ML IJ SOLN
INTRAMUSCULAR | Status: AC
  Filled 2015-10-14: qty 1

## 2015-10-14 MED ORDER — GLYCOPYRROLATE 0.2 MG/ML IJ SOLN
INTRAMUSCULAR | Status: DC | PRN
  Administered 2015-10-14: 0.6 mg via INTRAVENOUS

## 2015-10-14 MED ORDER — ONDANSETRON HCL 4 MG/2ML IJ SOLN
INTRAMUSCULAR | Status: AC
  Filled 2015-10-14: qty 2

## 2015-10-14 MED ORDER — MIDAZOLAM HCL 5 MG/5ML IJ SOLN
INTRAMUSCULAR | Status: DC | PRN
  Administered 2015-10-14: 1 mg via INTRAVENOUS

## 2015-10-14 MED ORDER — LABETALOL HCL 5 MG/ML IV SOLN
INTRAVENOUS | Status: AC
  Filled 2015-10-14: qty 4

## 2015-10-14 MED ORDER — PROPOFOL 10 MG/ML IV BOLUS
INTRAVENOUS | Status: AC
  Filled 2015-10-14: qty 20

## 2015-10-14 MED ORDER — LACTATED RINGERS IV SOLN
INTRAVENOUS | Status: DC | PRN
  Administered 2015-10-14: 13:00:00 via INTRAVENOUS

## 2015-10-14 MED ORDER — ONDANSETRON HCL 4 MG/2ML IJ SOLN
INTRAMUSCULAR | Status: DC | PRN
  Administered 2015-10-14: 4 mg via INTRAVENOUS

## 2015-10-14 MED ORDER — PROMETHAZINE HCL 25 MG/ML IJ SOLN: 6.2500 mg | INTRAMUSCULAR | Status: DC | PRN

## 2015-10-14 MED ORDER — FENTANYL CITRATE (PF) 250 MCG/5ML IJ SOLN
INTRAMUSCULAR | Status: AC
  Filled 2015-10-14: qty 5

## 2015-10-14 MED ORDER — LABETALOL HCL 5 MG/ML IV SOLN
INTRAVENOUS | Status: DC | PRN
  Administered 2015-10-14: 2.5 mg via INTRAVENOUS

## 2015-10-14 MED ORDER — EPHEDRINE SULFATE 50 MG/ML IJ SOLN
INTRAMUSCULAR | Status: AC
  Filled 2015-10-14: qty 1

## 2015-10-14 MED ORDER — ROCURONIUM BROMIDE 50 MG/5ML IV SOLN
INTRAVENOUS | Status: AC
  Filled 2015-10-14: qty 1

## 2015-10-14 MED ORDER — ARTIFICIAL TEARS OP OINT
TOPICAL_OINTMENT | OPHTHALMIC | Status: AC
  Filled 2015-10-14: qty 3.5

## 2015-10-14 MED ORDER — OXYCODONE HCL 5 MG PO TABS
5.0000 mg | ORAL_TABLET | ORAL | Status: DC | PRN
  Administered 2015-10-14 – 2015-10-16 (×6): 10 mg via ORAL
  Administered 2015-10-17 (×2): 5 mg via ORAL
  Administered 2015-10-23: 10 mg via ORAL
  Administered 2015-10-28: 5 mg via ORAL
  Filled 2015-10-14 (×2): qty 2
  Filled 2015-10-14: qty 1
  Filled 2015-10-14: qty 2
  Filled 2015-10-14: qty 1
  Filled 2015-10-14 (×2): qty 2
  Filled 2015-10-14: qty 1
  Filled 2015-10-14 (×4): qty 2

## 2015-10-14 MED ORDER — HEMOSTATIC AGENTS (NO CHARGE) OPTIME
TOPICAL | Status: DC | PRN
  Administered 2015-10-14: 1 via TOPICAL

## 2015-10-14 MED ORDER — PROPOFOL 10 MG/ML IV BOLUS
INTRAVENOUS | Status: DC | PRN
  Administered 2015-10-14: 40 mg via INTRAVENOUS
  Administered 2015-10-14: 30 mg via INTRAVENOUS
  Administered 2015-10-14: 100 mg via INTRAVENOUS
  Administered 2015-10-14: 30 mg via INTRAVENOUS

## 2015-10-14 MED ORDER — LACTATED RINGERS IV SOLN
INTRAVENOUS | Status: DC
  Administered 2015-10-14: 13:00:00 via INTRAVENOUS

## 2015-10-14 MED ORDER — BUPIVACAINE-EPINEPHRINE (PF) 0.25% -1:200000 IJ SOLN
INTRAMUSCULAR | Status: AC
  Filled 2015-10-14: qty 30

## 2015-10-14 MED ORDER — SUCCINYLCHOLINE CHLORIDE 20 MG/ML IJ SOLN
INTRAMUSCULAR | Status: DC | PRN
  Administered 2015-10-14: 80 mg via INTRAVENOUS

## 2015-10-14 MED ORDER — MIDAZOLAM HCL 2 MG/2ML IJ SOLN
INTRAMUSCULAR | Status: AC
  Filled 2015-10-14: qty 4

## 2015-10-14 MED ORDER — HYDROMORPHONE HCL 1 MG/ML IJ SOLN
INTRAMUSCULAR | Status: AC
  Administered 2015-10-14: 0.5 mg via INTRAVENOUS
  Filled 2015-10-14: qty 1

## 2015-10-14 MED ORDER — HYDROMORPHONE HCL 1 MG/ML IJ SOLN
0.2500 mg | INTRAMUSCULAR | Status: DC | PRN
  Administered 2015-10-14: 0.5 mg via INTRAVENOUS

## 2015-10-14 MED ORDER — ROCURONIUM BROMIDE 100 MG/10ML IV SOLN
INTRAVENOUS | Status: DC | PRN
  Administered 2015-10-14: 20 mg via INTRAVENOUS
  Administered 2015-10-14: 10 mg via INTRAVENOUS
  Administered 2015-10-14: 5 mg via INTRAVENOUS

## 2015-10-14 MED ORDER — NEOSTIGMINE METHYLSULFATE 10 MG/10ML IV SOLN
INTRAVENOUS | Status: DC | PRN
  Administered 2015-10-14: 4 mg via INTRAVENOUS

## 2015-10-14 MED ORDER — FENTANYL CITRATE (PF) 100 MCG/2ML IJ SOLN
INTRAMUSCULAR | Status: DC | PRN
  Administered 2015-10-14: 25 ug via INTRAVENOUS
  Administered 2015-10-14 (×6): 50 ug via INTRAVENOUS

## 2015-10-14 MED ORDER — BUPIVACAINE-EPINEPHRINE 0.25% -1:200000 IJ SOLN
INTRAMUSCULAR | Status: DC | PRN
  Administered 2015-10-14: 15 mL

## 2015-10-14 MED ORDER — SODIUM CHLORIDE 0.9 % IJ SOLN
INTRAMUSCULAR | Status: AC
  Filled 2015-10-14: qty 10

## 2015-10-14 MED ORDER — GLYCOPYRROLATE 0.2 MG/ML IJ SOLN
INTRAMUSCULAR | Status: AC
  Filled 2015-10-14: qty 3

## 2015-10-14 SURGICAL SUPPLY — 52 items
ADH SKN CLS APL DERMABOND .7 (GAUZE/BANDAGES/DRESSINGS) ×2
APPLIER CLIP 5 13 M/L LIGAMAX5 (MISCELLANEOUS)
APR CLP MED LRG 5 ANG JAW (MISCELLANEOUS)
BAG SPEC RTRVL 10 TROC 200 (ENDOMECHANICALS)
BLADE SURG ROTATE 9660 (MISCELLANEOUS) IMPLANT
CANISTER SUCTION 2500CC (MISCELLANEOUS) ×4 IMPLANT
CHLORAPREP W/TINT 26ML (MISCELLANEOUS) ×4 IMPLANT
CLIP APPLIE 5 13 M/L LIGAMAX5 (MISCELLANEOUS) ×1 IMPLANT
COVER MAYO STAND STRL (DRAPES) ×4 IMPLANT
COVER SURGICAL LIGHT HANDLE (MISCELLANEOUS) ×4 IMPLANT
DERMABOND ADVANCED (GAUZE/BANDAGES/DRESSINGS) ×2
DERMABOND ADVANCED .7 DNX12 (GAUZE/BANDAGES/DRESSINGS) ×1 IMPLANT
DRAPE C-ARM 42X72 X-RAY (DRAPES) ×4 IMPLANT
ELECT REM PT RETURN 9FT ADLT (ELECTROSURGICAL) ×4
ELECTRODE REM PT RTRN 9FT ADLT (ELECTROSURGICAL) ×2 IMPLANT
FILTER SMOKE EVAC LAPAROSHD (FILTER) IMPLANT
GLOVE BIO SURGEON STRL SZ8 (GLOVE) ×7 IMPLANT
GLOVE BIOGEL PI IND STRL 6.5 (GLOVE) ×3 IMPLANT
GLOVE BIOGEL PI IND STRL 8 (GLOVE) ×2 IMPLANT
GLOVE BIOGEL PI INDICATOR 6.5 (GLOVE) ×6
GLOVE BIOGEL PI INDICATOR 8 (GLOVE) ×2
GLOVE SURG SS PI 6.5 STRL IVOR (GLOVE) ×3 IMPLANT
GOWN STRL REUS W/ TWL LRG LVL3 (GOWN DISPOSABLE) ×4 IMPLANT
GOWN STRL REUS W/ TWL XL LVL3 (GOWN DISPOSABLE) ×2 IMPLANT
GOWN STRL REUS W/TWL LRG LVL3 (GOWN DISPOSABLE) ×8
GOWN STRL REUS W/TWL XL LVL3 (GOWN DISPOSABLE) ×4
KIT BASIN OR (CUSTOM PROCEDURE TRAY) ×4 IMPLANT
KIT ROOM TURNOVER OR (KITS) ×4 IMPLANT
L-HOOK LAP DISP 36CM (ELECTROSURGICAL) ×4
LHOOK LAP DISP 36CM (ELECTROSURGICAL) ×1 IMPLANT
LIQUID BAND (GAUZE/BANDAGES/DRESSINGS) ×4 IMPLANT
NEEDLE 22X1 1/2 (OR ONLY) (NEEDLE) ×4 IMPLANT
NS IRRIG 1000ML POUR BTL (IV SOLUTION) ×4 IMPLANT
PAD ARMBOARD 7.5X6 YLW CONV (MISCELLANEOUS) ×4 IMPLANT
POUCH ENDO CATCH II 15MM (MISCELLANEOUS) ×3 IMPLANT
POUCH RETRIEVAL ECOSAC 10 (ENDOMECHANICALS) ×1 IMPLANT
POUCH RETRIEVAL ECOSAC 10MM (ENDOMECHANICALS)
SCISSORS LAP 5X35 DISP (ENDOMECHANICALS) ×4 IMPLANT
SET CHOLANGIOGRAPH 5 50 .035 (SET/KITS/TRAYS/PACK) ×1 IMPLANT
SET IRRIG TUBING LAPAROSCOPIC (IRRIGATION / IRRIGATOR) ×4 IMPLANT
SLEEVE ENDOPATH XCEL 5M (ENDOMECHANICALS) ×8 IMPLANT
SPECIMEN JAR SMALL (MISCELLANEOUS) ×4 IMPLANT
SUT MNCRL AB 4-0 PS2 18 (SUTURE) ×3 IMPLANT
SUT VIC AB 4-0 PS2 27 (SUTURE) ×4 IMPLANT
SUT VICRYL 0 UR6 27IN ABS (SUTURE) ×3 IMPLANT
TOWEL OR 17X24 6PK STRL BLUE (TOWEL DISPOSABLE) ×4 IMPLANT
TOWEL OR 17X26 10 PK STRL BLUE (TOWEL DISPOSABLE) ×4 IMPLANT
TRAY LAPAROSCOPIC MC (CUSTOM PROCEDURE TRAY) ×4 IMPLANT
TROCAR BLADELESS 5M (ENDOMECHANICALS) ×3 IMPLANT
TROCAR XCEL BLUNT TIP 100MML (ENDOMECHANICALS) ×4 IMPLANT
TROCAR XCEL NON-BLD 5MMX100MML (ENDOMECHANICALS) ×4 IMPLANT
TUBING INSUFFLATION (TUBING) ×4 IMPLANT

## 2015-10-14 NOTE — Transfer of Care (Signed)
Immediate Anesthesia Transfer of Care Note  Patient: [name] SlickerJudith Thorup  Procedure(s) Performed: Procedure(s): LAPAROSCOPIC CHOLECYSTECTOMY  Patient Location: PACU  Anesthesia Type:General  Level of Consciousness: awake and alert   Airway & Oxygen Therapy: Patient Spontanous Breathing and Patient connected to nasal cannula oxygen  Post-op Assessment: Report given to RN, Post -op Vital signs reviewed and stable and Patient moving all extremities  Post vital signs: Reviewed and stable  Last Vitals:  Filed Vitals:   10/14/15 1500  BP: 142/62  Pulse: 108  Temp: 36.4 C  Resp: 15    Complications: No apparent anesthesia complications

## 2015-10-14 NOTE — Progress Notes (Signed)
. Family Medicine Teaching Service Daily Progress Note Intern Pager: [phone]  Patient name: [name] [name] Medical record number: [id number] Date of birth: May 30, 1976 Age: 39 y.o. Gender: female  Primary Care Provider: Luiz Blare, DO Consultants: ID, Surgery Code Status: FULL  Pt Overview and Major Events to Date:  11/6-11/7: Admitted for urosepsis; refractory to 4 litres of fluid with sbp 84 despite low dose levophed. Has been off pressors for most of 11/7. Iron Post cath changed as well. Started on Vanc and Imipenem  11/8: Transferred to floor from [place] 11/9: discontinued Vanc due to urine cultures showing ESBL and no growth with blood cultures  11/10: hgb 6.7; no active bleeding; transfuse 1 unit pRBC  11/11: Abx switched to Ertapenem; MRCP (unable to be obtained due to baclofen pump) 11/12: Surgery consulted, on CT abd/pelvis dx concern acute choley > to OR today for lap choley  Assessment and Plan:  # Presumed Acute Choleycstitis Clinically consistent with persistent RUQ abdominal pain, nausea, vomiting, some reduced PO, labs suggestive with initial elevated alk phos 300s to 100s, and LFTs, T bili trended down from 2 to normalized. Gradual worsening now, initially on hospitalization with RUQ [place] 11/7 (in ICU) with positive cholelithiasis and gb wall thickening but indeterminate at that time. Proceeded to treat her ESBL E Coli UTI with urosepsis but still having breakthrough fevers and worsening symptoms consistent with acute choley. Discussed with GI unable to obtain MRCP due to baclofen pump implant. - Confirmed suspicion for likely acute choley on CT Abd / pelvis w contrast (10/13/15) - Consulted General Surgery, greatly appreciate assistance. NPO, to proceed to OR today for lap choley. - Continue pain control, anti-emetics - Follow surgery recs on diet and IVF rehydration post-op  # ESBL E Coli Pyelonephritis w/ suprapubic catheter - Improving / Severe Sepsis - Resolved On  admission, refractory to 4 L of fluid with sbp 84 despite low dose levophed. Has been off pressors for most of 11/7. Lactic Acid 11/7: 0.6 and Procalcitonin 0.25. Confirmed ESBL E Coli on urine culture. Suprapubic catheter changed 10/09/15. Clinically improved on current appropriate antibiotic therapy. However with persistent breakthrough fevers, gradual improving, last overnight 100.36F with mild tachycardia, since resolved s/p tylenol. Now diagnosed presumed acute choley as likely cause of fevers, to proceed to lap choley today. - ID consulted with advice for antibiotic regimen, no longer following - Continue Ertapenem 1g IV daily (11/11>11/20) to complete total 14 day appropriate antibiotic course, (off imipenem from 11/7 to 11/10) - Blood culture 11/6: coagulase negative staph in one bottle (likely contaminant); second bottle NGTD x 5 day  # Normocytic Anemia, presumed chronic IDA Hgb 7.0>7.4 > 6.7 (11/10) > 8.2 ; on admission 9.5. No active bleeding noted clinically. FOBT negative. Per chart review, iron studies from 03/2014 note of iron deficiency anemia: Fe 18, TIBC 347, Saturation ratio5%, Ferritin 7)  - 1 unit pRBC ordered for transfusion 11/10 with  Post- TF HH 8.3 - anemia studies: Ferritin 58; Fe 9; TIBC 241; saturation ratio 4%; folate 16.1; Vit B12 1076 (H) - IV iron with Feraheme on 11/11, will need repeat dose in approx 3 days per pharm - Hgb today 7.8 (from 8.2)  # Transaminitis, likely in setting of severe sepsis and possible shock liver - Resolved  : Initially elevated 366/197/229>>> AST 21 / ALT 29 / T bili 0.6 5  # Hypokalemia: mild with K of 3.2 > 2.8 11/10 > 3.6 > 3.0 today (11/12) - Kdur 27mq  x 1 11/10 -  Replete K 40 mEq x 1 dose later today post-op  # Major Depression/Anxiety: - cont home Klonopin  - cont home Seroquel  # GERD: - protonix  # Stage 1 Decubitus Ulcer: erythema of skin but no skin breakdown. No tenderness.  - prophylaxis with dressing per nursing    # Hx PE with hx of anticoagulation: Per patient, had recent vaginal bleeding which was evaluated at [facility] this month; was told to stop taking anticoagulation until outpatient follow up.  - per chart review in care everywhere, patient was admitted to [facility] for vaginal bleeding and acute symptomatic anemia in Oct 2016 in the setting of anticoagulation use (xeralto). She was taken to the OR on 09/27/15 for IVC filter placement. At the time of discharge, her Xarelto was held until her follow up appointment in 2 weeks with Dr. Amalia Hailey and a plan will need to be made regarding anticoagulation and her IVC filter at this appointment - Additionally per chart review, patient's hemoglobin had been unstable after initiation of systemic anticoagulation per urology - will continue to hold Xeralto  # Cerebral Palsy - Tizanidine and Baclofen   FEN/GI: normal diet;  PPx: SQ Heparin   Disposition: assisted living facility pending management of complicated UTI/pyelo, since recovered from ICU with urosepsis. To complete IV antibiotics Ertapenem total 14 days, last dose 10/22/15. Now with new dx acute choley, pending surgery eval and OR lap choley on 11/12. - Following CSW to determine SNF on discharge.  Subjective:  Resting in bed. Reports that she feels similar to yesterday without any significant worsening. Admits still persistent RUQ abdominal pain without worsening. Admits nausea, no new vomiting. Did have low-grade temp overnight to 100.20F. Reduced PO and currently NPO per surgery for later lap choley, states to be around 1300 today.  She called her family member "[name] [name]" I spoke to her on phone and gave her updates, likely lap choley today, anticipate may be ready for discharge in few days. Patient requested that I call her father, [name] [name], I attempted and leave voicemail for him to contact her nurse to reach [place] for updates.  Objective: Temp:  [97.8 F (36.6 C)-100.6 F (38.1 C)] 97.9 F  (36.6 C) (11/12 0807) Pulse Rate:  [65-117] 83 (11/12 0807) Resp:  [16-18] 18 (11/12 0416) BP: (113-141)/(62-115) 140/115 mmHg (11/12 0807) SpO2:  [93 %-98 %] 97 % (11/12 0807) Physical Exam: General: NAD, resting in bed HEENT: Neck contracted to right, left IJ in place (non-tender no erythema);  MMM Cardiovascular: RRR, no m/r/g Respiratory: CTAB bilaterally Abdomen: soft, ND;  suprapubic catheter in place without surrounding erythema; Stable mild tenderness with palpation of RUQ; no rebound tenderness. Extremities: warm and dry Back: stage 1 decubitus ulcer - unchanged, dressing on  Laboratory:  Recent Labs Lab 10/12/15 0440 10/12/15 2215 10/13/15 0325 10/14/15 0516  WBC 6.0  --  8.4 6.9  HGB 6.7* 8.3* 8.2* 7.8*  HCT 21.7* 25.9* 26.4* 25.3*  PLT 354  --  378 344    Recent Labs Lab 10/12/15 0440 10/12/15 1525 10/13/15 0325 10/14/15 0516  NA 140 138 137 139  K 2.9* 4.1 3.6 3.0*  CL 108 106 104 105  CO2 _0 BUN <5* <5* <5* <5*  CREATININE 0.35* 0.37* 0.44 0.39*  CALCIUM 7.7* 8.0* 8.0* 8.1*  PROT 5.0*  --  5.4* 5.2*  BILITOT 0.4  --  1.1 0.6  ALKPHOS 146*  --  144* 127*  ALT 42  --  35  29  AST 11*  --  15 21  GLUCOSE 119* 94 109* 86   Anemia Panel - Iron 9, TIBC 241, Sat ratio 4, ferritin 58 Folate 16 Vitamin B-12, 1076  Imaging/Diagnostic Tests: ECHO: EF 65-70%; normal  CXR 11/6: IMPRESSION: Negative for pneumonia.  RUQ Abd [place] (11/7) IMPRESSION: Cholelithiasis with indeterminate presence of acute cholecystitis given conflicting findings (there is gallbladder distention and mild wall thickening but no focal tenderness). HIDA scan could evaluate cystic duct patency.  CXR 11/7: IMPRESSION: 1. Left IJ central line with tip at the upper right atrium. No pneumothorax. 2. Progressive hypoventilation and perihilar atelectasis.  CT Abd / Pelvis w/ contrast 11/11 IMPRESSION: 1. Significant distension of the gallbladder, gallstones, and  new gallbladder wall thickening consistent with acute cholecystitis. 2. Small bilateral pleural effusions, body wall edema. 3. Significant stool burden within the rectosigmoid colon suggests fecal impaction. 4. Suprapubic catheter unremarkable in appearance. Urinary bladder wall is thickened. 5. Smaller collections posterior to the urinary bladder compared prior study.  Olin Hauser, DO 10/14/2015, 8:43 AM PGY-3, [place] Intern pager: [phone], text pages welcome

## 2015-10-14 NOTE — H&P (Signed)
[name] [name] is an 39 y.o. female.   Chief Complaint: abd pain HPI: Pt is a 39 y/o F who comes in from a NH with a h/o fever, abd pain and nausea.  Pt was diagnosed and treated for UTI.  CT was obtained for possible kidney abscess.  CT revealed thickened GB wall and dilation and gallstones. AlkPhos was elevated but Liver functions normal.  Pt on abx and normal WBCs.  Past Medical History  Diagnosis Date  . Cerebral palsy ([name])   . Pulmonary embolism ([name])     Lifetime Coumadin  . Cerebral palsy ([name])     Spastic Cerebral palsy, mentally intact  . Contracture, joint, multiple sites     Electric wheelchair, uses left hand to operate chair.   [name] Kitchen Hypertension   . OCD (obsessive compulsive disorder)   . Depression   . Migraine   . [name]   . Dysarthria   . Endometriosis   . History of recurrent UTIs   . GERD (gastroesophageal reflux disease)   . Pulmonary embolism ([name] [name]) 2011, 01/2011    Will be on lifetime coumadin  . Anemia   . Abdominal pain 01/02/2012    Overview:  Overview:  Per Previous pcp note- Dr. Ta--Pt has history of endometriosis and had DUB in 2011.  She She had u/s that showed normal endometrial stripe.  She had endometrial bx that was wnl.  She has a lot of abd cramping every month.  This cramping was sometimes not relieved with hydrocodone.  She was referred to [name] for IUD placement to help endometriosis and abd cramping.  [name]  . Macroscopic hematuria 03/10/2013    status post replacement of suprapubic tube 03/04/2013   . HYPERTENSION, BENIGN 01/31/2010    Qualifier: Diagnosis of  By: [name] Laws, MD, [name] [name] History of endometriosis 10/2012    [facility]N [facility]: Laparoscopic Endometrial Ablation, TVH,  . Gram positive sepsis ([name] [name]) 10/24/2014  . HCAP (healthcare-associated pneumonia) 10/25/2014  . Abdominal pain 01/02/2012    Overview:  Overview:  Per Previous pcp note- Dr. Ta--Pt has history of endometriosis and had DUB in 2011.  She She had  u/s that showed normal endometrial stripe.  She had endometrial bx that was wnl.  She has a lot of abd cramping every month.  This cramping was sometimes not relieved with hydrocodone.  She was referred to [name] for IUD placement to help endometriosis and abd cramping.  [name] was placed 04/30/11 by [name].  Pt still complains of abd cramping, which I am treating with hydrocodone and ultram. Pt reports some initial relief of pain from IUD, but states it has now returned back to similar to previous cramping. Gave patient a shot of Toradol 60 mg IM today-since positive exacerbation of abdominal cramping during past 2 weeks. Physical exam reassuring. Patient may need to return to [place] for further workup since she is plugged in with the GYN providers there for further treatment options regarding endometriosis and abdominal cramping. The dysfunctional uterine bleeding now well contr  . Acute respiratory failure with hypoxia ([place])   . Cellulitis 12/22/2014  . Disease of female genital organs 10/24/2010    Overview:  Overview:  Pt has history of endometriosis and had DUB in 2011.  She had u/s that showed normal endometrial stripe.  She had endometrial bx that was wnl.  She has a lot of abd cramping every month.  This cramping was sometimes not relieved with hydrocodone.  She was referred to [name] for IUD placement to help endometriosis and abd cramping.  [name] was placed 04/30/11 by [name].  Pt still complains of abd cramping, which was treated with hydrocodone and ultram.   Last seen at [name] by OB/GYN 12/24/11, given Depo Lupron injection.  Ordering CT scan to r/o additional cause of abdominal pain (pt with known endometriosis and will not tolerate vaginal U/S, pain not improved s/p [name] or with Lupron injection-- only caused hot flashes and amenorrhea, no pain relief).  Also made referral to pain clinic for better pain control.  Do not think hysterectomy would help with pain, and concern for  contamination of baclofen pump if did proceed with surgery.    8/5/13[name]: Pt was seen by   . Dyspnea and respiratory abnormality 02/04/2011    Overview:  Overview:  Pt has history of recurrent PE and is on chronic coumadin.  Pt has been complaining of dyspnea that she describes as chest tightness.  She has been to the ER multiple times for this.  Usually she gets CTA and serial CE.  She has had at least 6 CTA in a period of since 2011.  She also has an O2 requirement. It is unclear why pt feels dyspneic, complains of chest tightness, or has O2 requirement.  I thought that her complaint may be from deconditioning and referred her to PT.  Unfortunately Medicaid only pays for 4 PT visits.  Pt should be doing these exercises at home, but she has not.  I have referred her to [name], and she has been seen by Dr Chase Caller.  He did not understand the etiology of her dyspnea and O2 requirement.  He will continue to follow her.   Last Assessment & Plan:  Pt presents with concerns for chest tightness and dyspnea.  She was seen in ED yesterday and discharged home.  While there she had a CT chest 04/20/11 showed 1.  Multifocal degradation as detailed above.  N  . Gross hematuria 03/12/2013    Overview:  Overview:  status post replacement of suprapubic tube 03/04/2013  Last Assessment & Plan:  Recent urine culture collected in ED on 5/4 did not result in significant growth. Moreover, patient has been asymptomatic with normal WBC, no fever and no true symptoms making symptomatic UTI less likely.  Will continue to monitor for any sign of infection.  Patient's xarelto had been held during her recent hospitalization due to hematuria. This has since been re-started. Monitor bleeding.    [name] Kitchen History of anticoagulant therapy 10/21/2012  . Infantile cerebral palsy ([name] [name]) 01/29/2007    Overview:  She has spastic CP.  She is mentally intact.  She is wheelchair bound.  She is wheelchair bound for ambulation      . Intracervical  pessary 05/07/2011    Overview:  Overview:  Placed by [name] 04/30/11 to treat DUB and Endometriosis.  She is seen at GYN clinic at [name] but was considered a poor surgical candidate and referred her to [name].  For DUB she had pelvic ultrasound that showed thin stripe and she had endometrial Bx by Dr Nori Riis in [place] [facility], which was negative.     . Parapneumonic effusion 10/25/2014  . Presence of intrathecal baclofen pump 03/07/2014    R LQ. Insertion T10.    [name] Kitchen Psychiatric illness 10/31/2014  . Recurrent pulmonary embolism ([place]) 05/03/2011    Pt has history of recurrent PE and is on chronic coumadin.  Pt  has been complaining of dyspnea that she describes as chest tightness.  She has been to the ER multiple times for this.  Usually she gets CTA and serial CE.  She has had at least 6 CTA in a period of since 2011.  She also has an O2 requirement. It is unclear why pt feels dyspneic, complains of chest tightness, or has O2 requirement.  I thought that her complaint may be from deconditioning and referred her to PT.  Unfortunately Medicaid only pays for 4 PT visits.  Pt should be doing these exercises at home, but she has not.  I have referred her to [facility], and she has been seen by Dr Chase Caller.  He did not understand the etiology of her dyspnea and O2 requirement.  He will continue to follow her.     . Seborrheic keratosis 01/18/2011  . Spasticity 01/05/2014  . Transient alteration of awareness, recurrent 09/08/2006    Recurrent episodes where Myanna  loses contact with her world and that have been studied thoroughly and appear nonepileptic in nature per Dr Krista Blue (neurology) The patient has had episodes starting in 2007 that initially began 1-4 times a day during which time she would have periods of unawareness followed by confusion. This continuous video EEG monitoring performed from October 8-12, 2007. Had   . Acute cystitis without hematuria   . Acute cystitis with hematuria   . Urethra dilated and  patulous 07/03/2015    Patulous, Dilated Urethra. 25m balloon on a 22-Fr catheter hoping this would prevent the bladder spasm from pushing the balloon down the urethra (Dr [facility]alia [facility], July 2016)     Past Surgical History  Procedure Laterality Date  . Colposcopy  06/2000  . Cesarean section      x 2  . Tubal ligation  2003  . Carpal tunnel release  08/2008    Dr SDaylene Katayama . Wrist surgery  06/2010    Dr LNicoletta Dress hand surgeon, [facility] . Baclofen pump refill      x 3 times  . Appendectomy    . Intrauterine device insertion  04/30/11    Inserted by WTownerfor endometriosis  . Laparoscopically assisted vag hysterectomy  10/27/2012  . Multiple extractions with alveoloplasty Bilateral 07/19/2013    Procedure: EXTRACTIONS #4, 51,93,79,02  Surgeon: CIsac Caddy DDS;  [facility]  Service: Oral Surgery;  Laterality: Bilateral;  . Pain pump implantation N/A 03/07/2014    Procedure: baclofen pump revision/replacement and Catheter connection replacement;  Surgeon: JOphelia Charter MD;  [facility];  Service: Neurosurgery;  Laterality: N/A;  baclofen pump revision/replacement and Catheter connection replacement  . Programable baclofen pump revision  03/17/14    Battery Replacement     Family History  Problem Relation Age of Onset  . Asthma Father   . Colon cancer Maternal Grandmother     Died in her 622's . Cancer Paternal Grandmother   . Breast cancer Paternal Grandmother     Died in her 778's . Heart attack Maternal Grandfather     Died in his 724's . Alzheimer's disease Paternal Grandfather     Died in his 872's  Social History:  reports that she has never smoked. She has never used smokeless tobacco. She reports that she drinks about 0.6 oz of alcohol per week. She reports that she does not use illicit drugs.  Allergies:  Allergies  Allergen Reactions  . Morphine Dermatitis and Other (See  Comments)    Skin turned red    Medications Prior to Admission   Medication Sig Dispense Refill  . acetaminophen (TYLENOL) 325 MG tablet Take 650 mg by mouth every 6 (six) hours as needed for moderate pain.     . ARIPiprazole (ABILIFY) 2 MG tablet Take 2 mg by mouth at bedtime.    . bacitracin-polymyxin b (POLYSPORIN) ointment Apply 1 g topically 2 (two) times daily. Apply around Suprapubic ostomy site    . baclofen (LIORESAL) 20 MG tablet Take 20 mg by mouth 4 (four) times daily. For bladder spasms    . bisacodyl (DULCOLAX) 10 MG suppository Place 10 mg rectally daily as needed for moderate constipation.    . clonazePAM (KLONOPIN) 1 MG tablet Take 1 tablet (1 mg total) by mouth 4 (four) times daily. 120 tablet 5  . Dimethicone (MOISTURE BARRIER EX) Apply 1 application topically as needed (for genitourinary).     [name] Kitchen docusate sodium (COLACE) 100 MG capsule Take 100 mg by mouth 2 (two) times daily.    . flavoxATE (URISPAS) 100 MG tablet Take 100 mg by mouth 3 (three) times daily as needed for bladder spasms.    [name] Kitchen FLUoxetine (PROZAC) 10 MG capsule Take 10 mg by mouth daily.    [name] Kitchen ibuprofen (ADVIL,MOTRIN) 200 MG tablet Take 400 mg by mouth every 8 (eight) hours as needed for moderate pain.     . magnesium hydroxide (MILK OF MAGNESIA) 400 MG/5ML suspension Take 30 mLs by mouth daily as needed for mild constipation.    . Multiple Vitamin (MULTIVITAMIN) tablet Take 1 tablet by mouth daily.    [name] Kitchen oxyCODONE (OXY IR/ROXICODONE) 5 MG immediate release tablet Take 5 mg by mouth every 4 (four) hours as needed for moderate pain or severe pain (may give 2 tablets every 4 hours for severe pain).    . polyethylene glycol powder (GLYCOLAX/MIRALAX) powder Take 17 g by mouth 2 (two) times daily.    . QUEtiapine (SEROQUEL) 100 MG tablet Take 1 tablet (100 mg total) by mouth at bedtime.    . senna-docusate (SENOKOT-S) 8.6-50 MG per tablet Take 1 tablet by mouth 2 (two) times daily. (Patient taking differently: Take 1 tablet by mouth daily. )    . Sodium Phosphates (RA SALINE ENEMA)  19-7 GM/118ML ENEM Place 1 application rectally once.    [name] Kitchen tiZANidine (ZANAFLEX) 4 MG capsule Take 4 mg by mouth 2 (two) times daily at 8 am and 10 pm.    . HYDROcodone-acetaminophen (NORCO/VICODIN) 5-325 MG per tablet Take 1 tablet by mouth every 6 (six) hours as needed (pain in both knees). (Patient not taking: Reported on 10/08/2015) 60 tablet 0    Results for orders placed or performed during the [name] encounter of 10/08/15 (from the past 48 hour(s))  Glucose, capillary     Status: Abnormal   Collection Time: 10/12/15  8:04 AM  Result Value Ref Range   Glucose-Capillary 170 (H) 65 - 99 mg/dL  Occult blood card to lab, stool RN will collect     Status: None   Collection Time: 10/12/15  9:10 AM  Result Value Ref Range   Fecal Occult Bld NEGATIVE NEGATIVE  Type and screen [place]     Status: None   Collection Time: 10/12/15 10:10 AM  Result Value Ref Range   ABO/RH(D) A POS    Antibody Screen NEG    Sample Expiration 10/15/2015    Unit Number [id number]    Blood Component Type RED  CELLS,LR    Unit division 00    Status of Unit ISSUED,FINAL    Transfusion Status OK TO TRANSFUSE    Crossmatch Result Compatible   Prepare RBC     Status: None   Collection Time: 10/12/15 10:10 AM  Result Value Ref Range   Order Confirmation ORDER PROCESSED BY BLOOD BANK   Lipase, blood     Status: None   Collection Time: 10/12/15  3:25 PM  Result Value Ref Range   Lipase 18 11 - 51 U/L  Gamma GT     Status: Abnormal   Collection Time: 10/12/15  3:25 PM  Result Value Ref Range   GGT 106 (H) 7 - 50 U/L  Vitamin B12     Status: Abnormal   Collection Time: 10/12/15  3:25 PM  Result Value Ref Range   Vitamin B-12 1076 (H) 180 - 914 pg/mL    Comment: (NOTE) This assay is not validated for testing neonatal or myeloproliferative syndrome specimens for Vitamin B12 levels.   Folate     Status: None   Collection Time: 10/12/15  3:25 PM  Result Value Ref Range   Folate 16.1  >5.9 ng/mL  Iron and TIBC     Status: Abnormal   Collection Time: 10/12/15  3:25 PM  Result Value Ref Range   Iron 9 (L) 28 - 170 ug/dL   TIBC 241 (L) 250 - 450 ug/dL   Saturation Ratios 4 (L) 10.4 - 31.8 %   UIBC 232 ug/dL  Ferritin     Status: None   Collection Time: 10/12/15  3:25 PM  Result Value Ref Range   Ferritin 58 11 - 307 ng/mL  Reticulocytes     Status: Abnormal   Collection Time: 10/12/15  3:25 PM  Result Value Ref Range   Retic Ct Pct 2.0 0.4 - 3.1 %   RBC. 2.79 (L) 3.87 - 5.11 MIL/uL   Retic Count, Manual 55.8 19.0 - 186.0 K/uL  Basic metabolic panel     Status: Abnormal   Collection Time: 10/12/15  3:25 PM  Result Value Ref Range   Sodium 138 135 - 145 mmol/L   Potassium 4.1 3.5 - 5.1 mmol/L    Comment: DELTA CHECK NOTED   Chloride 106 101 - 111 mmol/L   CO2 25 22 - 32 mmol/L   Glucose, Bld 94 65 - 99 mg/dL   BUN <5 (L) 6 - 20 mg/dL   Creatinine, Ser 0.37 (L) 0.44 - 1.00 mg/dL   Calcium 8.0 (L) 8.9 - 10.3 mg/dL   GFR calc non Af Amer >60 >60 mL/min   GFR calc Af Amer >60 >60 mL/min    Comment: (NOTE) The eGFR has been calculated using the CKD EPI equation. This calculation has not been validated in all clinical situations. eGFR's persistently <60 mL/min signify possible Chronic Kidney Disease.    Anion gap 7 5 - 15  Hemoglobin and hematocrit, blood     Status: Abnormal   Collection Time: 10/12/15 10:15 PM  Result Value Ref Range   Hemoglobin 8.3 (L) 12.0 - 15.0 g/dL   HCT 25.9 (L) 36.0 - 46.0 %  Comprehensive metabolic panel     Status: Abnormal   Collection Time: 10/13/15  3:25 AM  Result Value Ref Range   Sodium 137 135 - 145 mmol/L   Potassium 3.6 3.5 - 5.1 mmol/L   Chloride 104 101 - 111 mmol/L   CO2 26 22 - 32 mmol/L   Glucose, Bld 109 (  H) 65 - 99 mg/dL   BUN <5 (L) 6 - 20 mg/dL   Creatinine, Ser 0.44 0.44 - 1.00 mg/dL   Calcium 8.0 (L) 8.9 - 10.3 mg/dL   Total Protein 5.4 (L) 6.5 - 8.1 g/dL   Albumin 2.3 (L) 3.5 - 5.0 g/dL   AST 15 15 -  41 U/L   ALT 35 14 - 54 U/L   Alkaline Phosphatase 144 (H) 38 - 126 U/L   Total Bilirubin 1.1 0.3 - 1.2 mg/dL   GFR calc non Af Amer >60 >60 mL/min   GFR calc Af Amer >60 >60 mL/min    Comment: (NOTE) The eGFR has been calculated using the CKD EPI equation. This calculation has not been validated in all clinical situations. eGFR's persistently <60 mL/min signify possible Chronic Kidney Disease.    Anion gap 7 5 - 15  CBC     Status: Abnormal   Collection Time: 10/13/15  3:25 AM  Result Value Ref Range   WBC 8.4 4.0 - 10.5 K/uL   RBC 3.01 (L) 3.87 - 5.11 MIL/uL   Hemoglobin 8.2 (L) 12.0 - 15.0 g/dL   HCT 26.4 (L) 36.0 - 46.0 %   MCV 87.7 78.0 - 100.0 fL   MCH 27.2 26.0 - 34.0 pg   MCHC 31.1 30.0 - 36.0 g/dL   RDW 15.4 11.5 - 15.5 %   Platelets 378 150 - 400 K/uL  Comprehensive metabolic panel     Status: Abnormal   Collection Time: 10/14/15  5:16 AM  Result Value Ref Range   Sodium 139 135 - 145 mmol/L   Potassium 3.0 (L) 3.5 - 5.1 mmol/L   Chloride 105 101 - 111 mmol/L   CO2 28 22 - 32 mmol/L   Glucose, Bld 86 65 - 99 mg/dL   BUN <5 (L) 6 - 20 mg/dL   Creatinine, Ser 0.39 (L) 0.44 - 1.00 mg/dL   Calcium 8.1 (L) 8.9 - 10.3 mg/dL   Total Protein 5.2 (L) 6.5 - 8.1 g/dL   Albumin 2.1 (L) 3.5 - 5.0 g/dL   AST 21 15 - 41 U/L   ALT 29 14 - 54 U/L   Alkaline Phosphatase 127 (H) 38 - 126 U/L   Total Bilirubin 0.6 0.3 - 1.2 mg/dL   GFR calc non Af Amer >60 >60 mL/min   GFR calc Af Amer >60 >60 mL/min    Comment: (NOTE) The eGFR has been calculated using the CKD EPI equation. This calculation has not been validated in all clinical situations. eGFR's persistently <60 mL/min signify possible Chronic Kidney Disease.    Anion gap 6 5 - 15  CBC     Status: Abnormal   Collection Time: 10/14/15  5:16 AM  Result Value Ref Range   WBC 6.9 4.0 - 10.5 K/uL   RBC 2.88 (L) 3.87 - 5.11 MIL/uL   Hemoglobin 7.8 (L) 12.0 - 15.0 g/dL   HCT 25.3 (L) 36.0 - 46.0 %   MCV 87.8 78.0 - 100.0  fL   MCH 27.1 26.0 - 34.0 pg   MCHC 30.8 30.0 - 36.0 g/dL   RDW 15.7 (H) 11.5 - 15.5 %   Platelets 344 150 - 400 K/uL   *Note: Due to a large number of results and/or encounters for the requested time period, some results have not been displayed. A complete set of results can be found in Results Review.   Ct Abdomen Pelvis W Contrast  10/13/2015  ADDENDUM REPORT: 10/13/2015 21:36  ADDENDUM: The salient findings were discussed with Georges Lynch, M.D.on 10/13/2015 at 9:36 pm. Electronically Signed   By: Nolon Nations M.D.   On: 10/13/2015 21:36  10/13/2015  CLINICAL DATA:  Little tenderness RUQ area. Suprapubic tenderness. Pt was admitted for urosepsis and treated with antibiotics. Pt unable to have MRCP due to baclofen pump. Physician Notes: RUQ [place] 11/7 done while in ICU notes cholelithiasis. EXAM: CT ABDOMEN AND PELVIS WITH CONTRAST TECHNIQUE: Multidetector CT imaging of the abdomen and pelvis was performed using the standard protocol following bolus administration of intravenous contrast. CONTRAST:  65m OMNIPAQUE IOHEXOL 300 MG/ML  SOLN COMPARISON:  10/09/2015, 09/24/2015 FINDINGS: Lower chest: Bilateral small pleural effusions. Minimal bibasilar atelectasis. Heart size is normal. No imaged pericardial effusion or significant coronary artery calcifications. Upper abdomen: Gallbladder is distended and has a thickened wall. Numerous densities are seen within the gallbladder consistent with stones as seen on recent ultrasound exam. The gallbladder wall thickening is new since prior CT on 10/23. Findings are suspicious for acute cholecystitis. No focal abnormality identified within the liver, spleen, pancreas, or adrenal glands. The liver is mildly enlarged. Left renal cyst is present. No hydronephrosis. Gastrointestinal tract: Stomach and small bowel loops are normal in appearance. The appendix is not well seen but there is no inflammatory change in the right lower quadrant to indicate acute appendicitis.  There is large stool burden within the rectosigmoid colon. Pelvis: Urinary bladder has a thickened wall. Suprapubic catheter is in place. Small amount of air is identified within the urinary bladder. The collections seen posterior to the urinary bladder are not as well seen on today's exam. Small collection is identified at the region of the vaginal cuff, measuring 5.2 x 1.4 cm, significantly smaller than on the prior study. The second more inferior collection is not identified on today's exam. Patient has a right lower quadrant spinal stimulation device. Retroperitoneum: IVC filter apex at L2. No evidence for aortic aneurysm. Abdominal wall: Diffuse body wall edema. Small amount of gas within the subcutaneous fat indicate probable sites in the left mid abdominal wall. Osseous structures: Unremarkable. IMPRESSION: 1. Significant distension of the gallbladder, gallstones, and new gallbladder wall thickening consistent with acute cholecystitis. 2. Small bilateral pleural effusions, body wall edema. 3. Significant stool burden within the rectosigmoid colon suggests fecal impaction. 4. Suprapubic catheter unremarkable in appearance. Urinary bladder wall is thickened. 5. Smaller collections posterior to the urinary bladder compared prior study. Electronically Signed: By: ENolon NationsM.D. On: 10/13/2015 21:25    Review of Systems  Constitutional: Positive for fever.  HENT: Negative.   Respiratory: Negative.   Cardiovascular: Negative.   Gastrointestinal: Positive for nausea and abdominal pain.  Genitourinary: Negative.   Musculoskeletal: Negative.   Neurological: Negative.     Blood pressure 141/77, pulse 84, temperature 97.8 F (36.6 C), temperature source Oral, resp. rate 18, height 5' (1.524 m), weight 61.7 kg (136 lb 0.4 oz), last menstrual period 04/05/2011, SpO2 93 %. Physical Exam  Constitutional: She is oriented to person, place, and time. She appears well-developed and well-nourished.    HENT:  Head: Normocephalic and atraumatic.  Eyes: Conjunctivae and EOM are normal. Pupils are equal, round, and reactive to light.  Neck: Normal range of motion. Neck supple.  Cardiovascular: Normal rate, regular rhythm and normal heart sounds.   Respiratory: Effort normal and breath sounds normal.  GI: Soft. She exhibits no distension. There is tenderness (ruq/luq). There is no rebound and no guarding.    Musculoskeletal:  Contracted  RUE   Neurological: She is alert and oriented to person, place, and time.  Skin: Skin is warm and dry.     Assessment/Plan 39 y/o F with RUQ pain and signs of acute cholecystitis. 1. To OR  Lap chole 2. NPO 3. Consent for Lap chole possible IOC 4. Con't abx  Rosario Jacks., Jamilex Bohnsack 10/14/2015, 7:13 AM

## 2015-10-14 NOTE — Progress Notes (Signed)
Patient ID: Mindi SlickerJudith [name], female   DOB: 08/10/1976, 39 y.o.   MRN: [id number] Agree with Dr. Jacinto Halimamirez's evaluation and I examined her as well. Cholecystitis with cholelithiasis. I plan laparoscopic cholecystectomy with cholangiogram. Procedure, risks, and benefits D/W her and she agrees. We also discussed the possible need to convert to open procedure. We will take care to stay away from her baclofen pump. She agrees.  Violeta GelinasBurke Marquis Diles, MD, MPH, FACS Trauma: [phone] General Surgery: [phone]

## 2015-10-14 NOTE — Op Note (Signed)
10/08/2015 - 10/14/2015  2:41 PM  PATIENT:  [name] [name]  39 y.o. female  PRE-OPERATIVE DIAGNOSIS: Acute Cholecystitis, Cholelithiasis  POST-OPERATIVE DIAGNOSIS:  Acute Cholecystitis, Cholelithiasis  PROCEDURE:  Procedure(s): LAPAROSCOPIC CHOLECYSTECTOMY  SURGEON:  Surgeon(s): Violeta GelinasBurke Trini Christiansen, MD  ASSISTANTS: none   ANESTHESIA:   local and general  EBL:  Total I/O In: -  Out: 1650 [Urine:1600; Blood:50]  BLOOD ADMINISTERED:none  DRAINS: none   SPECIMEN:  Excision  DISPOSITION OF SPECIMEN:  PATHOLOGY  COUNTS:  YES  DICTATION: .Dragon Dictation Findings: Acute calculus cholecystitis with large stones  Procedure: [name] was identified in the preop holding area. She received intravenous and amount. Informed consent was obtained. She was brought to the operating room and general endotracheal anesthesia was administered by the anesthesia staff. Abdomen was prepped and draped in sterile fashion. We did time out procedure.The infraumbilical region was infiltrated with local. Infraumbilical incision was made. Subcutaneous tissues were dissected down revealing the anterior fascia. This was divided sharply along the midline. Peritoneal cavity was entered under direct vision without complication. A 0 Vicryl pursestring was placed around the fascial opening. Hassan trocar was inserted into the abdomen. The abdomen was insufflated with carbon dioxide in standard fashion. Under direct vision a 11 mm epigastric and 25 mm right upper quadrant ports were placed. Local was used at each port site. Care was taken to stay away from her baclofen pump in her right abdominal wall. Laparoscopic exploration revealed an extremely inflamed gallbladder with adherent omentum. Omentum was gradually stripped away revealing a tensely distended gallbladder. This was drained with the Nahzat drainage system revealing dark bile. The dome of the gallbladder was retracted superior medially. The infundibulum was  retracted inferior laterally. Dissection began laterally and progressed medially easily identifying the cystic duct. There was a lot of acute inflammation. It was very short precluding cholangiogram. Dissection continued until critical view was obtained between the cystic duct, infundibulum, and the liver. Once excellent visualization. 3 clips were placed proximally on the cystic duct. One was placed distally and it was divided. Further dissection revealed anterior and posterior branch of the cystic artery. These were clipped proximally and divided distally with cautery. Gallbladder was taken off the liver bed with cautery. It was placed in a bag and removed from the infraumbilical port site. Liver bed was then cauterized to get excellent hemostasis. A piece of Surgicel snow was also placed. It appeared dry at the completion of this procedure. The abdomen was Irrigated and Irrigation Fluid Returned Clear. Liver Bed Remained Dry and Clips Were in Good Position. Ports Were Removed under Direct Vision. Pneumoperitoneum Was Released. Infraumbilical Fascia Was Closed by Placing an Additional Figure-Of-Eight 0 Vicryl Suture Tying It Securely Followed by Tying the Pursestring. All 4 Wounds Were Copiously Irrigated and the Skin of Each Was closed with 4-0 Monocryl subcuticular. Liquid band was placed. All counts were correct. Patient tolerated procedure well without apparent complications was taken recovery in stable condition.  PATIENT DISPOSITION:  PACU - hemodynamically stable.   Delay start of Pharmacological VTE agent (>24hrs) due to surgical blood loss or risk of bleeding:  no  Violeta GelinasBurke Dijuan Sleeth, MD, MPH, FACS Pager: [phone]  11/12/20162:41 PM

## 2015-10-14 NOTE — Anesthesia Preprocedure Evaluation (Addendum)
Anesthesia Evaluation  Patient identified by MRN, date of birth, ID band  Reviewed: Allergy & Precautions, NPO status , Patient's Chart, lab work & pertinent test results  Airway Mallampati: II  TM Distance: <3 FB Neck ROM: Limited    Dental   Pulmonary shortness of breath,    breath sounds clear to auscultation       Cardiovascular hypertension,  Rhythm:Regular Rate:Normal     Neuro/Psych cerebral palsy c contrac tures    GI/Hepatic GERD  ,  Endo/Other    Renal/GU      Musculoskeletal  (+) Arthritis ,   Abdominal   Peds  Hematology  (+) anemia ,   Anesthesia Other Findings   Reproductive/Obstetrics                            Anesthesia Physical Anesthesia Plan  ASA: IV  Anesthesia Plan: General   Post-op Pain Management:    Induction: Intravenous  Airway Management Planned: Oral ETT  Additional Equipment:   Intra-op Plan:   Post-operative Plan: Possible Post-op intubation/ventilation  Informed Consent:   Dental advisory given  Plan Discussed with: CRNA and Surgeon  Anesthesia Plan Comments:         Anesthesia Quick Evaluation

## 2015-10-14 NOTE — Anesthesia Procedure Notes (Signed)
Procedure Name: Intubation Date/Time: 10/14/2015 1:14 PM Performed by: Edmonia CaprioAUSTON, Lynnley Doddridge M Pre-anesthesia Checklist: Patient identified, Emergency Drugs available, Suction available, Patient being monitored and Timeout performed Patient Re-evaluated:Patient Re-evaluated prior to inductionOxygen Delivery Method: Circle system utilized Preoxygenation: Pre-oxygenation with 100% oxygen Intubation Type: IV induction Ventilation: Mask ventilation without difficulty Laryngoscope Size: Miller and 2 Grade View: Grade I Tube type: Oral Tube size: 7.0 mm Number of attempts: 1 Airway Equipment and Method: Stylet Placement Confirmation: ETT inserted through vocal cords under direct vision,  breath sounds checked- equal and bilateral and positive ETCO2 Secured at: 21 cm Tube secured with: Tape Dental Injury: Teeth and Oropharynx as per pre-operative assessment  Comments: Patient's head easy to move into sniffing position once asleep.  Easy intubation.

## 2015-10-14 NOTE — Anesthesia Postprocedure Evaluation (Signed)
  Anesthesia Post-op Note  Patient: [name] SlickerJudith [name]  Procedure(s) Performed: Procedure(s): LAPAROSCOPIC CHOLECYSTECTOMY  Patient Location: PACU  Anesthesia Type:General  Level of Consciousness: awake and alert   Airway and Oxygen Therapy: Patient Spontanous Breathing  Post-op Pain: mild  Post-op Assessment: Post-op Vital signs reviewed LLE Motor Response:  (weak, moves toes)   RLE Motor Response:  (weak, moves toes)        Post-op Vital Signs: stable  Last Vitals:  Filed Vitals:   10/14/15 1500  BP: 142/62  Pulse: 108  Temp: 36.4 C  Resp: 15    Complications: No apparent anesthesia complications

## 2015-10-15 LAB — BASIC METABOLIC PANEL
Anion gap: 7 (ref 5–15)
BUN: <5 mg/dL — ABNORMAL LOW (ref 6–20)
CHLORIDE: 99 mmol/L — AB (ref 101–111)
CO2: 29 mmol/L (ref 22–32)
CREATININE: 0.37 mg/dL — AB (ref 0.44–1.00)
Calcium: 8 mg/dL — ABNORMAL LOW (ref 8.9–10.3)
Glucose, Bld: 94 mg/dL (ref 65–99)
POTASSIUM: 3.1 mmol/L — AB (ref 3.5–5.1)
SODIUM: 135 mmol/L (ref 135–145)

## 2015-10-15 LAB — CBC
HCT: 28.9 % — ABNORMAL LOW (ref 36.0–46.0)
HEMOGLOBIN: 8.8 g/dL — AB (ref 12.0–15.0)
MCH: 27.1 pg (ref 26.0–34.0)
MCHC: 30.4 g/dL (ref 30.0–36.0)
MCV: 88.9 fL (ref 78.0–100.0)
Platelets: 408 10*3/uL — ABNORMAL HIGH (ref 150–400)
RBC: 3.25 MIL/uL — ABNORMAL LOW (ref 3.87–5.11)
RDW: 16.1 % — AB (ref 11.5–15.5)
WBC: 8.2 10*3/uL (ref 4.0–10.5)

## 2015-10-15 MED ORDER — HEPARIN SODIUM (PORCINE) 5000 UNIT/ML IJ SOLN
5000.0000 [IU] | Freq: Three times a day (TID) | INTRAMUSCULAR | Status: DC
  Administered 2015-10-15 – 2015-10-30 (×46): 5000 [IU] via SUBCUTANEOUS
  Filled 2015-10-15 (×36): qty 1

## 2015-10-15 MED ORDER — POTASSIUM CHLORIDE CRYS ER 20 MEQ PO TBCR
40.0000 meq | EXTENDED_RELEASE_TABLET | Freq: Once | ORAL | Status: AC
  Administered 2015-10-15: 40 meq via ORAL
  Filled 2015-10-15: qty 2

## 2015-10-15 MED ORDER — POLYETHYLENE GLYCOL 3350 17 G PO PACK
17.0000 g | PACK | Freq: Two times a day (BID) | ORAL | Status: DC
  Administered 2015-10-15 – 2015-10-17 (×5): 17 g via ORAL
  Filled 2015-10-15 (×7): qty 1

## 2015-10-15 NOTE — Progress Notes (Signed)
. Family Medicine Teaching Service Daily Progress Note Intern Pager: [phone]  Patient name: [name] [name] Medical record number: [id number] Date of birth: Oct 09, 1976 Age: 39 y.o. Gender: female  Primary Care Provider: Luiz Blare, DO Consultants: ID, Surgery Code Status: FULL  Pt Overview and Major Events to Date:  11/6-11/7: Admitted for urosepsis; refractory to 4 litres of fluid with sbp 84 despite low dose levophed. Has been off pressors for most of 11/7. Ehrhardt cath changed as well. Started on Vanc and Imipenem  11/8: Transferred to floor from [place] 11/9: discontinued Vanc due to urine cultures showing ESBL and no growth with blood cultures  11/10: hgb 6.7; no active bleeding; transfuse 1 unit pRBC  11/11: Abx switched to Ertapenem; MRCP (unable to be obtained due to baclofen pump) 11/12: Surgery consulted, on CT abd/pelvis dx concern acute choley > to OR today for lap choley  Assessment and Plan:  # Presumed Acute Choleycstitis Clinically consistent with persistent RUQ abdominal pain, nausea, vomiting, some reduced PO, labs suggestive with initial elevated alk phos 300s to 100s, and LFTs, T bili trended down from 2 to normalized. Gradual worsening now, initially on hospitalization with RUQ [place] 11/7 (in ICU) with positive cholelithiasis and gb wall thickening but indeterminate at that time. Proceeded to treat her ESBL E Coli UTI with urosepsis but still having breakthrough fevers and worsening symptoms consistent with acute choley. Discussed with GI unable to obtain MRCP due to baclofen pump implant. - Confirmed suspicion for likely acute choley on CT Abd / pelvis w contrast (10/13/15) - Consulted General Surgery, greatly appreciate assistance. 11/12 lap choley. - Continue pain control, anti-emetics - Surgery recs: can resume normal diet as tolerated; okay to dc when medically ready. Resume Heparin for DVT prophylaxis  # ESBL E Coli Pyelonephritis w/ suprapubic catheter -  Improving / Severe Sepsis - Resolved On admission, refractory to 4 L of fluid with sbp 84 despite low dose levophed. Has been off pressors for most of 11/7. Lactic Acid 11/7: 0.6 and Procalcitonin 0.25. Confirmed ESBL E Coli on urine culture. Suprapubic catheter changed 10/09/15. Clinically improved on current appropriate antibiotic therapy. However with persistent breakthrough fevers, gradual improving, last overnight 102.20F with mild tachycardia, this AM 99.21F and HR wnl - ID consulted with advice for antibiotic regimen, no longer following - Continue Ertapenem 1g IV daily (11/11>11/20) to complete total 14 day appropriate antibiotic course, (off imipenem from 11/7 to 11/10) - Blood culture 11/6: coagulase negative staph in one bottle (likely contaminant); second bottle NGTD x 5 day - continued to spike fevers (over 6 days on antibiotics; possible post op fever), consider reconsulting ID  Fecal Impaction: on CT in rectosigmoid colon: Patient denies feeling constipated, however states that she is on Miralax BID at home.  - restarted home Miralax  - consider suppository x1  # Normocytic Anemia, presumed chronic IDA Hgb 7.0>7.4 > 6.7 (11/10) > 8.2 ; on admission 9.5. No active bleeding noted clinically. FOBT negative. Per chart review, iron studies from 03/2014 note of iron deficiency anemia: Fe 18, TIBC 347, Saturation ratio5%, Ferritin 7)  - 1 unit pRBC ordered for transfusion 11/10 with  Post- TF HH 8.3 - anemia studies: Ferritin 58; Fe 9; TIBC 241; saturation ratio 4%; folate 16.1; Vit B12 1076 (H) - IV iron with Feraheme on 11/11, will need repeat dose in approx 3 days per pharm - Hgb today 8.8 (from 7.8)  # Transaminitis, likely in setting of severe sepsis and possible shock liver - Resolved  Initially elevated 366/197/229>>> AST 21 / ALT 29 / T bili 0.6 5  # Hypokalemia: mild with K of 3.2 > 2.8 11/10 > 3.6 > 3.0  > 3.1 - Kdur 46mq  x 1 11/10 - Replete K 40 mEq x 1 11/13  # Major  Depression/Anxiety: - cont home Klonopin  - cont home Seroquel  # GERD: - protonix  # Stage 1 Decubitus Ulcer: erythema of skin but no skin breakdown. No tenderness.  - prophylaxis with dressing per nursing   # Hx PE with hx of anticoagulation: Per patient[facility] was told to stop taking anticoagulation until outpatient follow up.  - per chart review in care everywhere, [facility] in the setting of anticoagulation use (xeralto). She was taken to the OR on 09/27/15 for IVC filter placement. At the time of discharge, her Xarelto was held until her follow up appointment in 2 weeks with Dr. EAmalia Haileyand a plan will need to be made regarding anticoagulation and her IVC filter at this appointment - Additionally per chart review, patient's hemoglobin had been unstable after initiation of systemic anticoagulation per urology - will continue to hold Xeralto  # Cerebral Palsy - Tizanidine and Baclofen   FEN/GI: normal diet;  PPx: SQ Heparin   Disposition: assisted living facility pending management of complicated UTI/pyelo, since recovered from ICU with urosepsis. To complete IV antibiotics Ertapenem total 14 days, last dose 10/22/15. Now with new dx acute choley, pending surgery eval and OR lap choley on 11/12. - Following CSW to determine SNF on discharge.  Subjective:  - patient feels little better from yesterday  - some abdominal soreness after surgery - drinking fluids  Objective: Temp:  [97.5 F (36.4 C)-102.7 F (39.3 C)] 99.5 F (37.5 C) (11/13 0844) Pulse Rate:  [84-144] 108 (11/13 0844) Resp:  [15-23] 20 (11/13 0844) BP: (117-152)/(57-100) 122/70 mmHg (11/13 0844) SpO2:  [91 %-98 %] 97 % (11/13 0844) Weight:  [121 lb (54.885 kg)] 121 lb (54.885 kg) (11/12 2000) Physical Exam: General: NAD, resting in bed HEENT: Neck  contracted to right, left IJ in place (non-tender no erythema);  MMM Cardiovascular: RRR, no m/r/g Respiratory: CTAB bilaterally Abdomen: incisions dry and without erythema; some soreness to palapation; no suprapubic tenderness; suprapubic catheter: note mild discharge (tan), no surrounding erythema Extremities: warm and dry Back: stage 1 decubitus ulcer   Laboratory:  Recent Labs Lab 10/13/15 0325 10/14/15 0516 10/15/15 0543  WBC 8.4 6.9 8.2  HGB 8.2* 7.8* 8.8*  HCT 26.4* 25.3* 28.9*  PLT 378 344 408*    Recent Labs Lab 10/12/15 0440  10/13/15 0325 10/14/15 0516 10/15/15 0815  NA 140  < > 137 139 135  K 2.9*  < > 3.6 3.0* 3.1*  CL 108  < > 104 105 99*  CO2 27  < > _0 BUN <5*  < > <5* <5* <5*  CREATININE 0.35*  < > 0.44 0.39* 0.37*  CALCIUM 7.7*  < > 8.0* 8.1* 8.0*  PROT 5.0*  --  5.4* 5.2*  --   BILITOT 0.4  --  1.1 0.6  --   ALKPHOS 146*  --  144* 127*  --   ALT 42  --  35 29  --   AST 11*  --  15 21  --   GLUCOSE 119*  < > 109*  86 94  < > = values in this interval not displayed. Anemia Panel - Iron 9, TIBC 241, Sat ratio 4, ferritin 58 Folate 16 Vitamin B-12, 1076  Imaging/Diagnostic Tests: ECHO: EF 65-70%; normal  CXR 11/6: IMPRESSION: Negative for pneumonia.  RUQ Abd [place] (11/7) IMPRESSION: Cholelithiasis with indeterminate presence of acute cholecystitis given conflicting findings (there is gallbladder distention and mild wall thickening but no focal tenderness). HIDA scan could evaluate cystic duct patency.  CXR 11/7: IMPRESSION: 1. Left IJ central line with tip at the upper right atrium. No pneumothorax. 2. Progressive hypoventilation and perihilar atelectasis.  CT Abd / Pelvis w/ contrast 11/11 IMPRESSION: 1. Significant distension of the gallbladder, gallstones, and new gallbladder wall thickening consistent with acute cholecystitis. 2. Small bilateral pleural effusions, body wall edema. 3. Significant stool burden within the  rectosigmoid colon suggests fecal impaction. 4. Suprapubic catheter unremarkable in appearance. Urinary bladder wall is thickened. 5. Smaller collections posterior to the urinary bladder compared prior study.  Smiley Houseman, MD 10/15/2015, 9:19 AM PGY-1, [place] Intern pager: [phone], text pages welcome

## 2015-10-15 NOTE — Progress Notes (Signed)
1 Day Post-Op  Subjective: She is sore, but site looks fine.  She is doing OK with clears.  Medicine was present and I told them she could have her diet advanced and could be mobilized.    Objective: Vital signs in last 24 hours: Temp:  [97.5 F (36.4 C)-102.7 F (39.3 C)] 102.7 F (39.3 C) (11/13 0427) Pulse Rate:  [84-144] 139 (11/13 0427) Resp:  [15-23] 20 (11/13 0427) BP: (117-152)/(57-100) 117/64 mmHg (11/13 0427) SpO2:  [91 %-98 %] 91 % (11/13 0427) Weight:  [54.885 kg (121 lb)] 54.885 kg (121 lb) (11/12 2000) Last BM Date: 10/12/15 Nothing PO recorded. Temp up to 102.7 this AM 0300 WBC 8.2 this AM H/H is better. Intake/Output from previous day: 11/12 0701 - 11/13 0700 In: 500 [I.V.:500] Out: 2700 [Urine:2650; Blood:50] Intake/Output this shift:    General appearance: alert, cooperative and no distress GI: soft, sore, sites look fine, normal tenderness RUQ.  Lab Results:   Recent Labs  10/14/15 0516 10/15/15 0543  WBC 6.9 8.2  HGB 7.8* 8.8*  HCT 25.3* 28.9*  PLT 344 408*    BMET  Recent Labs  10/13/15 0325 10/14/15 0516  NA 137 139  K 3.6 3.0*  CL 104 105  CO2 26 28  GLUCOSE 109* 86  BUN <5* <5*  CREATININE 0.44 0.39*  CALCIUM 8.0* 8.1*   PT/INR No results for input(s): LABPROT, INR in the last 72 hours.   Recent Labs Lab 10/10/15 0455 10/11/15 0415 10/12/15 0440 10/13/15 0325 10/14/15 0516  AST 27 16 11* 15 21  ALT 87* 61* 42 35 29  ALKPHOS 190* 173* 146* 144* 127*  BILITOT 0.6 0.5 0.4 1.1 0.6  PROT 5.0* 5.4* 5.0* 5.4* 5.2*  ALBUMIN 2.1* 2.1* 2.0* 2.3* 2.1*     Lipase     Component Value Date/Time   LIPASE 18 10/12/2015 1525     Studies/Results: Ct Abdomen Pelvis W Contrast  10/13/2015  ADDENDUM REPORT: 10/13/2015 21:36 ADDENDUM: The salient findings were discussed with Mickie Hillier, M.D.on 10/13/2015 at 9:36 pm. Electronically Signed   By: Norva Pavlov M.D.   On: 10/13/2015 21:36  10/13/2015  CLINICAL DATA:  Little  tenderness RUQ area. Suprapubic tenderness. Pt was admitted for urosepsis and treated with antibiotics. Pt unable to have MRCP due to baclofen pump. Physician Notes: RUQ [place] 11/7 done while in ICU notes cholelithiasis. EXAM: CT ABDOMEN AND PELVIS WITH CONTRAST TECHNIQUE: Multidetector CT imaging of the abdomen and pelvis was performed using the standard protocol following bolus administration of intravenous contrast. CONTRAST:  80mL OMNIPAQUE IOHEXOL 300 MG/ML  SOLN COMPARISON:  10/09/2015, 09/24/2015 FINDINGS: Lower chest: Bilateral small pleural effusions. Minimal bibasilar atelectasis. Heart size is normal. No imaged pericardial effusion or significant coronary artery calcifications. Upper abdomen: Gallbladder is distended and has a thickened wall. Numerous densities are seen within the gallbladder consistent with stones as seen on recent ultrasound exam. The gallbladder wall thickening is new since prior CT on 10/23. Findings are suspicious for acute cholecystitis. No focal abnormality identified within the liver, spleen, pancreas, or adrenal glands. The liver is mildly enlarged. Left renal cyst is present. No hydronephrosis. Gastrointestinal tract: Stomach and small bowel loops are normal in appearance. The appendix is not well seen but there is no inflammatory change in the right lower quadrant to indicate acute appendicitis. There is large stool burden within the rectosigmoid colon. Pelvis: Urinary bladder has a thickened wall. Suprapubic catheter is in place. Small amount of air is identified within  the urinary bladder. The collections seen posterior to the urinary bladder are not as well seen on today's exam. Small collection is identified at the region of the vaginal cuff, measuring 5.2 x 1.4 cm, significantly smaller than on the prior study. The second more inferior collection is not identified on today's exam. Patient has a right lower quadrant spinal stimulation device. Retroperitoneum: IVC filter apex  at L2. No evidence for aortic aneurysm. Abdominal wall: Diffuse body wall edema. Small amount of gas within the subcutaneous fat indicate probable sites in the left mid abdominal wall. Osseous structures: Unremarkable. IMPRESSION: 1. Significant distension of the gallbladder, gallstones, and new gallbladder wall thickening consistent with acute cholecystitis. 2. Small bilateral pleural effusions, body wall edema. 3. Significant stool burden within the rectosigmoid colon suggests fecal impaction. 4. Suprapubic catheter unremarkable in appearance. Urinary bladder wall is thickened. 5. Smaller collections posterior to the urinary bladder compared prior study. Electronically Signed: By: Norva PavlovElizabeth  Brown M.D. On: 10/13/2015 21:25    Medications: . baclofen  20 mg Oral QID  . clonazePAM  0.5 mg Oral 3 times per day  . ertapenem  1 g Intravenous Q24H  . pantoprazole  40 mg Oral QHS  . QUEtiapine  100 mg Oral QHS  . tiZANidine  4 mg Oral 2 times per day   . sodium chloride 75 mL/hr at 10/14/15 0608  . lactated ringers 10 mL/hr at 10/14/15 1247    Assessment/Plan Acute Cholecystitis, Cholelithiasis S/p laparoscopic cholecystectomy,10/14/15 Severe Urosepsis with shock Cerebral palsy, confined to wheelchair and bed/multiple contracture sites Chronic anemia Hx of PE Hx of hypertension Hx of OCD/Depression Hx of migaraines Antibiotics:  INvanz day 4, she has completed 7 days of antibiotics since admit 10/08/15 DVT:  SCD/no heparin since 10/12/15  This can be restarted today  Plan:  Watch her fevers, advance diet as tolerated. Not sure where fever is from, will follow with you Medicine is primary and will continue work up and treatment.  She can go back on Heparin for DVT prophylaxis.      LOS: 6 days    Addalie Calles 10/15/2015

## 2015-10-16 ENCOUNTER — Encounter (HOSPITAL_COMMUNITY): Payer: Self-pay | Admitting: General Surgery

## 2015-10-16 LAB — COMPREHENSIVE METABOLIC PANEL
ALBUMIN: 2.1 g/dL — AB (ref 3.5–5.0)
ALK PHOS: 248 U/L — AB (ref 38–126)
ALT: 31 U/L (ref 14–54)
ANION GAP: 5 (ref 5–15)
AST: 26 U/L (ref 15–41)
BILIRUBIN TOTAL: 0.6 mg/dL (ref 0.3–1.2)
BUN: <5 mg/dL — ABNORMAL LOW (ref 6–20)
CALCIUM: 8.1 mg/dL — AB (ref 8.9–10.3)
CO2: 30 mmol/L (ref 22–32)
CREATININE: 0.36 mg/dL — AB (ref 0.44–1.00)
Chloride: 104 mmol/L (ref 101–111)
GFR calc non Af Amer: >60 mL/min (ref >60)
GLUCOSE: 101 mg/dL — AB (ref 65–99)
Potassium: 3.7 mmol/L (ref 3.5–5.1)
SODIUM: 139 mmol/L (ref 135–145)
TOTAL PROTEIN: 5.1 g/dL — AB (ref 6.5–8.1)

## 2015-10-16 LAB — CBC
HEMATOCRIT: 26.5 % — AB (ref 36.0–46.0)
HEMOGLOBIN: 8.2 g/dL — AB (ref 12.0–15.0)
MCH: 28.5 pg (ref 26.0–34.0)
MCHC: 30.9 g/dL (ref 30.0–36.0)
MCV: 92 fL (ref 78.0–100.0)
Platelets: 358 10*3/uL (ref 150–400)
RBC: 2.88 MIL/uL — AB (ref 3.87–5.11)
RDW: 16.9 % — ABNORMAL HIGH (ref 11.5–15.5)
WBC: 6.8 10*3/uL (ref 4.0–10.5)

## 2015-10-16 MED ORDER — SODIUM CHLORIDE 0.9 % IV SOLN
INTRAVENOUS | Status: DC
  Administered 2015-10-16 – 2015-10-18 (×3): via INTRAVENOUS

## 2015-10-16 NOTE — Progress Notes (Signed)
. Family Medicine Teaching Service Daily Progress Note Intern Pager: [phone]  Patient name: [name] [name] Medical record number: [id number] Date of birth: Jun 09, 1976 Age: 39 y.o. Gender: female  Primary Care Provider: Luiz Blare, DO Consultants: ID, Surgery Code Status: FULL  Pt Overview and Major Events to Date:  11/6-11/7: Admitted for urosepsis; refractory to 4 litres of fluid with sbp 84 despite low dose levophed. Has been off pressors for most of 11/7. [place] cath changed as well. Started on Vanc and Imipenem  11/8: Transferred to floor from [place] 11/9: discontinued Vanc due to urine cultures showing ESBL and no growth with blood cultures  11/10: hgb 6.7; no active bleeding; transfuse 1 unit pRBC  11/11: Abx switched to Ertapenem; MRCP (unable to be obtained due to baclofen pump) 11/12: Surgery consulted, on CT abd/pelvis dx concern acute choley > to OR today for lap choley  Assessment and Plan:  # Acute Choleycstitis s/p lap choley (11/12) Clinically consistent with persistent RUQ abdominal pain, nausea, vomiting, some reduced PO, labs suggestive with initial elevated alk phos 300s to 100s, and LFTs, T bili trended down from 2 to normalized. Gradual worsening now, initially on hospitalization with RUQ [place] 11/7 (in ICU) with positive cholelithiasis and gb wall thickening but indeterminate at that time. Proceeded to treat her ESBL E Coli UTI with urosepsis but still having breakthrough fevers and worsening symptoms consistent with acute choley. Discussed with GI unable to obtain MRCP due to baclofen pump implant. - Confirmed suspicion for likely acute choley on CT Abd / pelvis w contrast (10/13/15) - Consulted General Surgery, greatly appreciate assistance. 11/12 lap choley. - Continue pain control, anti-emetics - Surgery recs: can resume normal diet as tolerated; okay to dc when medically ready; surgery s/u arranged - Alk phos: 248 (11/14) from 127; will monitor  # ESBL E Coli  Pyelonephritis w/ suprapubic catheter - Improving / Severe Sepsis - Resolved On admission, refractory to 4 L of fluid with sbp 84 despite low dose levophed. Has been off pressors for most of 11/7. Lactic Acid 11/7: 0.6 and Procalcitonin 0.25. Confirmed ESBL E Coli on urine culture. Suprapubic catheter changed 10/09/15. Clinically improved on current appropriate antibiotic therapy. However had persistent breakthrough fevers, gradual improving, tmax 100.29F overnight, with mild tachycardia. Seems to have less fever spikes  - ID consulted with advice for antibiotic regimen, no longer following - Continue Ertapenem 1g IV daily (11/11>11/20) to complete total 14 day appropriate antibiotic course, (off imipenem from 11/7 to 11/10) - Blood culture 11/6: coagulase negative staph in one bottle (likely contaminant); second bottle NGTD x 5 day - will re-culture if spikes high grade fever (>101F)  # Fecal Impaction: on CT in rectosigmoid colon: Patient denies feeling constipated, however states that she is on Miralax BID at home.  - restarted home Miralax (11/14)  # Normocytic Anemia, presumed chronic IDA Hgb 7.0>7.4 > 6.7 (11/10) > 8.2 >7.8>8.8 >8.2 ; on admission 9.5. No active bleeding noted clinically. FOBT negative. Per chart review, iron studies from 03/2014 note of iron deficiency anemia: Fe 18, TIBC 347, Saturation ratio5%, Ferritin 7)  - 1 unit pRBC transfused 11/10; hgb stable - anemia studies: Ferritin 58; Fe 9; TIBC 241; saturation ratio 4%; folate 16.1; Vit B12 1076 (H) - IV iron with Feraheme on 11/11, will need repeat dose in approx 3 days per pharm  # Transaminitis, likely in setting of severe sepsis and possible shock liver - Resolved  Initially elevated 366/197/229>>> AST 21 / ALT 29 / T  bili 0.6 5  # Hypokalemia: mild with K of 3.2 > 2.8 11/10 > 3.6 > 3.0  > 3.1 - Kdur 27mq  x 1 11/10 - Replete K 40 mEq x 1 11/13  # Major Depression/Anxiety: - cont home Klonopin  - cont home  Seroquel  # GERD: - protonix  # Stage 1 Decubitus Ulcer: erythema of skin but no skin breakdown. No tenderness.  - prophylaxis with dressing per nursing   # Hx PE with hx of anticoagulation: Per patient, [facility] was told to stop taking anticoagulation until outpatient follow up.  - per chart review in care everywhere, [facility] in the setting of anticoagulation use (xeralto). She was taken to the OR on 09/27/15 for IVC filter placement. At the time of discharge, her Xarelto was held until her follow up appointment in 2 weeks with Dr. EAmalia Haileyand a plan will need to be made regarding anticoagulation and her IVC filter at this appointment - Additionally per chart review, patient's hemoglobin had been unstable after initiation of systemic anticoagulation per urology - will continue to hold Xeralto  # Cerebral Palsy - Tizanidine and Baclofen   FEN/GI: normal diet; NS 75cc/hr  PPx: SQ Heparin   Disposition: assisted living facility pending management of complicated UTI/pyelo, since recovered from ICU with urosepsis. To complete IV antibiotics Ertapenem total 14 days, last dose 10/22/15. Now with new dx acute choley, pending surgery eval and OR lap choley on 11/12. - Following CSW to determine SNF on discharge.  Subjective:  - feels about the same  - abdomen is mildly sore  Objective: Temp:  [98.2 F (36.8 C)-100.4 F (38 C)] 99.5 F (37.5 C) (11/14 0521) Pulse Rate:  [100-108] 105 (11/14 0521) Resp:  [19-20] 19 (11/14 0521) BP: (122-135)/(64-87) 124/87 mmHg (11/14 0521) SpO2:  [97 %-99 %] 99 % (11/14 0521) Weight:  [130 lb 11.7 oz (59.3 kg)] 130 lb 11.7 oz (59.3 kg) (11/14 0521) Physical Exam: General: NAD, resting in bed HEENT: Neck contracted to right, left IJ in place (non-tender no erythema);  MMM Cardiovascular: RRR, no  m/r/g Respiratory: CTAB bilaterally Abdomen: incisions dry and without erythema; some soreness to palapation; no suprapubic tenderness; suprapubic catheter: no surrounding erythema Extremities: warm and dry Back: stage 1 decubitus ulcer   Laboratory:  Recent Labs Lab 10/13/15 0325 10/14/15 0516 10/15/15 0543  WBC 8.4 6.9 8.2  HGB 8.2* 7.8* 8.8*  HCT 26.4* 25.3* 28.9*  PLT 378 344 408*    Recent Labs Lab 10/12/15 0440  10/13/15 0325 10/14/15 0516 10/15/15 0815  NA 140  < > 137 139 135  K 2.9*  < > 3.6 3.0* 3.1*  CL 108  < > 104 105 99*  CO2 27  < > _0 BUN <5*  < > <5* <5* <5*  CREATININE 0.35*  < > 0.44 0.39* 0.37*  CALCIUM 7.7*  < > 8.0* 8.1* 8.0*  PROT 5.0*  --  5.4* 5.2*  --   BILITOT 0.4  --  1.1 0.6  --   ALKPHOS 146*  --  144* 127*  --   ALT 42  --  35 29  --   AST 11*  --  15 21  --   GLUCOSE 119*  < > 109* 86 94  < > = values in this interval not displayed. Anemia Panel -  Iron 9, TIBC 241, Sat ratio 4, ferritin 58 Folate 16 Vitamin B-12, 1076  Imaging/Diagnostic Tests: ECHO: EF 65-70%; normal  CXR 11/6: IMPRESSION: Negative for pneumonia.  RUQ Abd [place] (11/7) IMPRESSION: Cholelithiasis with indeterminate presence of acute cholecystitis given conflicting findings (there is gallbladder distention and mild wall thickening but no focal tenderness). HIDA scan could evaluate cystic duct patency.  CXR 11/7: IMPRESSION: 1. Left IJ central line with tip at the upper right atrium. No pneumothorax. 2. Progressive hypoventilation and perihilar atelectasis.  CT Abd / Pelvis w/ contrast 11/11 IMPRESSION: 1. Significant distension of the gallbladder, gallstones, and new gallbladder wall thickening consistent with acute cholecystitis. 2. Small bilateral pleural effusions, body wall edema. 3. Significant stool burden within the rectosigmoid colon suggests fecal impaction. 4. Suprapubic catheter unremarkable in appearance. Urinary bladder wall is  thickened. 5. Smaller collections posterior to the urinary bladder compared prior study.  Smiley Houseman, MD 10/16/2015, 7:01 AM PGY-1, [facility] Intern pager: [phone], text pages welcome

## 2015-10-16 NOTE — Progress Notes (Signed)
Patient ID: [name] [name], female   DOB: [birth year], 39 y.o.   MRN: [id number] 2 Days Post-Op  Subjective: Pt being fed.  Says she has a little nausea, but it is better than preoperatively.  Pain is controlled  Objective: Vital signs in last 24 hours: Temp:  [98.2 F (36.8 C)-100.4 F (38 C)] 98.6 F (37 C) (11/14 0919) Pulse Rate:  [99-105] 99 (11/14 0919) Resp:  [18-20] 18 (11/14 0919) BP: (124-135)/(64-87) 129/77 mmHg (11/14 0919) SpO2:  [98 %-99 %] 99 % (11/14 0919) Weight:  [59.3 kg (130 lb 11.7 oz)] 59.3 kg (130 lb 11.7 oz) (11/14 0521) Last BM Date: 10/12/15  Intake/Output from previous day: 11/13 0701 - 11/14 0700 In: 560 [P.O.:560] Out: 1500 [Urine:1500] Intake/Output this shift: Total I/O In: 240 [P.O.:240] Out: -   PE: Abd: soft, baclofen pump palpable in right mid abdomen, all incisions are c/d/i, some BS  Lab Results:   Recent Labs  10/15/15 0543 10/16/15 0650  WBC 8.2 6.8  HGB 8.8* 8.2*  HCT 28.9* 26.5*  PLT 408* 358   BMET  Recent Labs  10/15/15 0815 10/16/15 0650  NA 135 139  K 3.1* 3.7  CL 99* 104  CO2 29 30  GLUCOSE 94 101*  BUN <5* <5*  CREATININE 0.37* 0.36*  CALCIUM 8.0* 8.1*   PT/INR No results for input(s): LABPROT, INR in the last 72 hours. CMP     Component Value Date/Time   NA 139 10/16/2015 0650   NA 139 03/30/2015 2149   K 3.7 10/16/2015 0650   CL 104 10/16/2015 0650   CO2 30 10/16/2015 0650   GLUCOSE 101* 10/16/2015 0650   BUN <5* 10/16/2015 0650   BUN 16 03/30/2015 2149   CREATININE 0.36* 10/16/2015 0650   CREATININE 0.5 03/30/2015 2149   CREATININE 0.75 05/10/2011 1218   CALCIUM 8.1* 10/16/2015 0650   PROT 5.1* 10/16/2015 0650   ALBUMIN 2.1* 10/16/2015 0650   AST 26 10/16/2015 0650   ALT 31 10/16/2015 0650   ALKPHOS 248* 10/16/2015 0650   BILITOT 0.6 10/16/2015 0650   GFRNONAA >60 10/16/2015 0650   GFRAA >60 10/16/2015 0650   Lipase     Component Value Date/Time   LIPASE 18 10/12/2015 1525        Studies/Results: No results found.  Anti-infectives: Anti-infectives    Start     Dose/Rate Route Frequency Ordered Stop   10/13/15 0600  ertapenem (INVANZ) 1 g in sodium chloride 0.9 % 50 mL IVPB     1 g 100 mL/hr over 30 Minutes Intravenous Every 24 hours 10/12/15 1519 10/23/15 0559   10/09/15 2000  vancomycin (VANCOCIN) IVPB 750 mg/150 ml premix  Status:  Discontinued     750 mg 150 mL/hr over 60 Minutes Intravenous Every 12 hours 10/09/15 1134 10/11/15 1427   10/09/15 1200  imipenem-cilastatin (PRIMAXIN) 250 mg in sodium chloride 0.9 % 100 mL IVPB     250 mg 200 mL/hr over 30 Minutes Intravenous 4 times per day 10/09/15 1134 10/13/15 0553   10/09/15 1100  vancomycin (VANCOCIN) 500 mg in sodium chloride 0.9 % 100 mL IVPB  Status:  Discontinued     500 mg 100 mL/hr over 60 Minutes Intravenous Every 8 hours 10/09/15 0229 10/09/15 0429   10/09/15 1000  piperacillin-tazobactam (ZOSYN) IVPB 3.375 g  Status:  Discontinued     3.375 g 12.5 mL/hr over 240 Minutes Intravenous Every 8 hours 10/09/15 0229 10/09/15 0428   10/09/15 0800  piperacillin-tazobactam (ZOSYN) IVPB  3.375 g  Status:  Discontinued     3.375 g 12.5 mL/hr over 240 Minutes Intravenous Every 8 hours 10/09/15 0428 10/09/15 0920   10/09/15 0800  vancomycin (VANCOCIN) 500 mg in sodium chloride 0.9 % 100 mL IVPB  Status:  Discontinued     500 mg 100 mL/hr over 60 Minutes Intravenous Every 8 hours 10/09/15 0429 10/09/15 1134   10/09/15 0230  piperacillin-tazobactam (ZOSYN) IVPB 3.375 g  Status:  Discontinued     3.375 g 12.5 mL/hr over 240 Minutes Intravenous Every 8 hours 10/09/15 0221 10/09/15 0428   10/09/15 0230  vancomycin (VANCOCIN) IVPB 1000 mg/200 mL premix  Status:  Discontinued     1,000 mg 200 mL/hr over 60 Minutes Intravenous  Once 10/09/15 0221 10/09/15 0427   10/09/15 0045  piperacillin-tazobactam (ZOSYN) IVPB 3.375 g     3.375 g 100 mL/hr over 30 Minutes Intravenous  Once 10/09/15 0032 10/09/15 0217    10/09/15 0045  vancomycin (VANCOCIN) IVPB 1000 mg/200 mL premix     1,000 mg 200 mL/hr over 60 Minutes Intravenous  Once 10/09/15 0032 10/09/15 0243   10/09/15 0030  cefTRIAXone (ROCEPHIN) 1 g in dextrose 5 % 50 mL IVPB     1 g 100 mL/hr over 30 Minutes Intravenous  Once 10/09/15 0018 10/09/15 0107       Assessment/Plan  1. POD 2, s/p lap chole -patient tolerating her solid diet -minimal fever of 100.4. If not from urosepsis still, then this is likely secondary to atelectasis and not something infectious. -patient is surgically stable for DC from our standpoint. -follow up arranged for patient.  LOS: 7 days    Omolara Carol E 10/16/2015, 9:27 AM Pager: [phone]

## 2015-10-17 ENCOUNTER — Encounter: Payer: Medicare Other | Admitting: Family Medicine

## 2015-10-17 LAB — COMPREHENSIVE METABOLIC PANEL
ALBUMIN: 1.9 g/dL — AB (ref 3.5–5.0)
ALK PHOS: 199 U/L — AB (ref 38–126)
ALT: 23 U/L (ref 14–54)
AST: 21 U/L (ref 15–41)
Anion gap: 4 — ABNORMAL LOW (ref 5–15)
BILIRUBIN TOTAL: 0.6 mg/dL (ref 0.3–1.2)
CALCIUM: 7.8 mg/dL — AB (ref 8.9–10.3)
CO2: 30 mmol/L (ref 22–32)
CREATININE: 0.38 mg/dL — AB (ref 0.44–1.00)
Chloride: 104 mmol/L (ref 101–111)
GFR calc Af Amer: >60 mL/min (ref >60)
GLUCOSE: 88 mg/dL (ref 65–99)
POTASSIUM: 3.5 mmol/L (ref 3.5–5.1)
Sodium: 138 mmol/L (ref 135–145)
TOTAL PROTEIN: 4.9 g/dL — AB (ref 6.5–8.1)

## 2015-10-17 LAB — CBC
HEMATOCRIT: 24.6 % — AB (ref 36.0–46.0)
HEMOGLOBIN: 7.5 g/dL — AB (ref 12.0–15.0)
MCH: 28.1 pg (ref 26.0–34.0)
MCHC: 30.5 g/dL (ref 30.0–36.0)
MCV: 92.1 fL (ref 78.0–100.0)
Platelets: 263 10*3/uL (ref 150–400)
RBC: 2.67 MIL/uL — ABNORMAL LOW (ref 3.87–5.11)
RDW: 17.3 % — AB (ref 11.5–15.5)
WBC: 5.6 10*3/uL (ref 4.0–10.5)

## 2015-10-17 MED ORDER — SODIUM CHLORIDE 0.9 % IV SOLN
510.0000 mg | Freq: Once | INTRAVENOUS | Status: AC
  Administered 2015-10-17: 510 mg via INTRAVENOUS
  Filled 2015-10-17: qty 17

## 2015-10-17 MED ORDER — MILK AND MOLASSES ENEMA
1.0000 | Freq: Once | RECTAL | Status: AC
  Administered 2015-10-17: 250 mL via RECTAL
  Filled 2015-10-17: qty 250

## 2015-10-17 NOTE — Progress Notes (Signed)
Patient Care Update:  Regarding [name] [name]'s disposition with pending discharge back to SNF ([facility] Dr[facility] McDiarmid). As noted in prior notes, patient wished to return [facility]. Discussed this with CSW today, to review her course and they have been in contact [facility], currently they are at capacity for their medicaid beds, and no availability for patient to be discharged back [name] at this time. As per CSW, patient will be [facility], anticipate she will reside there for minimum of 30 days, and then remain in [facility] to arrange potential transfer back once availability (of long-term care medicaid bed).  I discussed this update with Geriatrics attending, Dr. McDiarmid and he is aware and our [facility] team / CSW will try to stay in communication with [name] [name] and her family to help facilitate a future transfer back to [facility].  [name] PilarAlexander Karamalegos, DO [facility], PGY-3

## 2015-10-17 NOTE — Progress Notes (Signed)
FPTS Interim Progress Note  Patient Care Update:  Regarding [name]'s disposition with pending discharge back to SNF ([facility], [facility] with Dr. McDiarmid). As noted in prior notes, patient wished to [facility]. Discussed this with CSW today, to review her course and they have been in contact [facility], currently they are at capacity for their medicaid beds, and no availability for patient to be discharged back [facility] at this time. As per CSW, patient will be [facility], anticipate she will reside there for minimum of 30 days, and then [facility] to arrange potential transfer back once availability (of long-term care medicaid bed).  I discussed this update with Geriatrics attending, Dr. McDiarmid and he is aware and our [facility] team / CSW will try to stay in communication with Mrs. [name] [name] and her family to help facilitate a future transfer back to [facility].  Smitty CordsAlexander J Savoy [place], DO 10/17/2015, 1:26 PM PGY-3, [facility] Service pager [phone]

## 2015-10-17 NOTE — Progress Notes (Signed)
Will see if it will be possible to transfer [name] back [facility] once she has been at new nursing facility for 30 days.

## 2015-10-17 NOTE — Clinical Social Work Note (Signed)
CSW talked with [name] at her request regarding her d/c. Patient requested a skilled facility list. [facility] explained to [facility]. CSW also talked with patient about what she needs to do after discharge to get back to [facility] if this is her desire.  Skilled facility list will be provided to patient.   Genelle BalVanessa Henretter Piekarski, MSW, LCSW Licensed Clinical Social Worker Clinical Social Work Department [facility] [phone]

## 2015-10-17 NOTE — Progress Notes (Signed)
Patient ID: [name] [name], female   DOB: March 11, 1976, 39 y.o.   MRN: [id number] 3 Days Post-Op  Subjective: Patient says she's tired today.  Still a little nauseated.  Drank some juice for breakfast.  States she is slightly hungry.  Objective: Vital signs in last 24 hours: Temp:  [98.2 F (36.8 C)-100.9 F (38.3 C)] 98.2 F (36.8 C) (11/15 0500) Pulse Rate:  [88-114] 88 (11/15 0500) Resp:  [18] 18 (11/15 0500) BP: (121-142)/(50-81) 122/51 mmHg (11/15 0500) SpO2:  [95 %-99 %] 99 % (11/15 0500) Weight:  [60.691 kg (133 lb 12.8 oz)] 60.691 kg (133 lb 12.8 oz) (11/14 2037) Last BM Date: 10/12/15  Intake/Output from previous day: 11/14 0701 - 11/15 0700 In: 2542.5 [P.O.:920; I.V.:1472.5; IV Piggyback:150] Out: 775 [Urine:775] Intake/Output this shift:    PE: Abd: soft, appropriately tender, +BS, ND, incisions c/d/i  Lab Results:   Recent Labs  10/16/15 0650 10/17/15 0600  WBC 6.8 5.6  HGB 8.2* 7.5*  HCT 26.5* 24.6*  PLT 358 263   BMET  Recent Labs  10/16/15 0650 10/17/15 0600  NA 139 138  K 3.7 3.5  CL 104 104  CO2 30 30  GLUCOSE 101* 88  BUN <5* <5*  CREATININE 0.36* 0.38*  CALCIUM 8.1* 7.8*   PT/INR No results for input(s): LABPROT, INR in the last 72 hours. CMP     Component Value Date/Time   NA 138 10/17/2015 0600   NA 139 03/30/2015 2149   K 3.5 10/17/2015 0600   CL 104 10/17/2015 0600   CO2 30 10/17/2015 0600   GLUCOSE 88 10/17/2015 0600   BUN <5* 10/17/2015 0600   BUN 16 03/30/2015 2149   CREATININE 0.38* 10/17/2015 0600   CREATININE 0.5 03/30/2015 2149   CREATININE 0.75 05/10/2011 1218   CALCIUM 7.8* 10/17/2015 0600   PROT 4.9* 10/17/2015 0600   ALBUMIN 1.9* 10/17/2015 0600   AST 21 10/17/2015 0600   ALT 23 10/17/2015 0600   ALKPHOS 199* 10/17/2015 0600   BILITOT 0.6 10/17/2015 0600   GFRNONAA >60 10/17/2015 0600   GFRAA >60 10/17/2015 0600   Lipase     Component Value Date/Time   LIPASE 18 10/12/2015 1525        Studies/Results: No results found.  Anti-infectives: Anti-infectives    Start     Dose/Rate Route Frequency Ordered Stop   10/13/15 0600  ertapenem (INVANZ) 1 g in sodium chloride 0.9 % 50 mL IVPB     1 g 100 mL/hr over 30 Minutes Intravenous Every 24 hours 10/12/15 1519 10/23/15 0559   10/09/15 2000  vancomycin (VANCOCIN) IVPB 750 mg/150 ml premix  Status:  Discontinued     750 mg 150 mL/hr over 60 Minutes Intravenous Every 12 hours 10/09/15 1134 10/11/15 1427   10/09/15 1200  imipenem-cilastatin (PRIMAXIN) 250 mg in sodium chloride 0.9 % 100 mL IVPB     250 mg 200 mL/hr over 30 Minutes Intravenous 4 times per day 10/09/15 1134 10/13/15 0553   10/09/15 1100  vancomycin (VANCOCIN) 500 mg in sodium chloride 0.9 % 100 mL IVPB  Status:  Discontinued     500 mg 100 mL/hr over 60 Minutes Intravenous Every 8 hours 10/09/15 0229 10/09/15 0429   10/09/15 1000  piperacillin-tazobactam (ZOSYN) IVPB 3.375 g  Status:  Discontinued     3.375 g 12.5 mL/hr over 240 Minutes Intravenous Every 8 hours 10/09/15 0229 10/09/15 0428   10/09/15 0800  piperacillin-tazobactam (ZOSYN) IVPB 3.375 g  Status:  Discontinued  3.375 g 12.5 mL/hr over 240 Minutes Intravenous Every 8 hours 10/09/15 0428 10/09/15 0920   10/09/15 0800  vancomycin (VANCOCIN) 500 mg in sodium chloride 0.9 % 100 mL IVPB  Status:  Discontinued     500 mg 100 mL/hr over 60 Minutes Intravenous Every 8 hours 10/09/15 0429 10/09/15 1134   10/09/15 0230  piperacillin-tazobactam (ZOSYN) IVPB 3.375 g  Status:  Discontinued     3.375 g 12.5 mL/hr over 240 Minutes Intravenous Every 8 hours 10/09/15 0221 10/09/15 0428   10/09/15 0230  vancomycin (VANCOCIN) IVPB 1000 mg/200 mL premix  Status:  Discontinued     1,000 mg 200 mL/hr over 60 Minutes Intravenous  Once 10/09/15 0221 10/09/15 0427   10/09/15 0045  piperacillin-tazobactam (ZOSYN) IVPB 3.375 g     3.375 g 100 mL/hr over 30 Minutes Intravenous  Once 10/09/15 0032 10/09/15 0217    10/09/15 0045  vancomycin (VANCOCIN) IVPB 1000 mg/200 mL premix     1,000 mg 200 mL/hr over 60 Minutes Intravenous  Once 10/09/15 0032 10/09/15 0243   10/09/15 0030  cefTRIAXone (ROCEPHIN) 1 g in dextrose 5 % 50 mL IVPB     1 g 100 mL/hr over 30 Minutes Intravenous  Once 10/09/15 0018 10/09/15 0107       Assessment/Plan  1. POD 3, s/p lap chole  -still with some nausea, but can not really determine whether this is from her surgery or still from her urosepsis.  She stated yesterday her nausea was better than before surgery, so it may still be a symptom of her urosepsis.  Would treat with prn zofran for now.  Diet as tolerated -still with low grade fevers, given she is only POD 3, unlikely to be infectious from gb.  Likely atelectatic vs residual from ESBL bacteremia/urosepsis -defer further care to primary service   LOS: 8 days    Yazan Gatling E 10/17/2015, 9:20 AM Pager: [phone]

## 2015-10-17 NOTE — Progress Notes (Signed)
Went into the room to administer Milk of molasses, patient wanted to to call her dad to see if he is going to come or not to visit her today.  She wants the enema to be administered after her dad leaves from here.  She wanted to wait till she calls her dad.  Will follow up.

## 2015-10-17 NOTE — Care Management Important Message (Signed)
Important Message  Patient Details  Name: [name] SlickerJudith [name] MRN: [id number] Date of Birth: 1976/11/07   Medicare Important Message Given:  Yes    Madline Oesterling, Annamarie MajorCheryl U, RN 10/17/2015, 12:48 PM

## 2015-10-17 NOTE — Progress Notes (Signed)
. Family Medicine Teaching Service Daily Progress Note Intern Pager: [phone]  Patient name: [name] [name] Medical record number: [id number] Date of birth: 1976-04-15 Age: 39 y.o. Gender: female  Primary Care Provider: Luiz Blare, DO Consultants: ID, Surgery Code Status: FULL  Pt Overview and Major Events to Date:  11/6-11/7: Admitted for urosepsis; refractory to 4 litres of fluid with sbp 84 despite low dose levophed. Has been off pressors for most of 11/7. [place] cath changed as well. Started on Vanc and Imipenem  11/8: Transferred to floor from [place] 11/9: discontinued Vanc due to urine cultures showing ESBL and no growth with blood cultures  11/10: hgb 6.7; no active bleeding; transfuse 1 unit pRBC  11/11: Abx switched to Ertapenem; MRCP (unable to be obtain due to baclofen pump) 11/12: Surgery consulted, on CT abd/pelvis dx concern acute choley > to OR today for lap choley  Assessment and Plan:  # Acute Choleycstitis s/p lap choley (11/12) - Confirmed suspicion for likely acute choley on CT Abd / pelvis w contrast (10/13/15) - Consulted General Surgery, greatly appreciate assistance. 11/12 lap choley. - Continue pain control, anti-emetics - Surgery recs: okay to dc when medically ready; surgery f/u arranged - Alk phos: 248 (11/14) > 199  # ESBL E Coli Pyelonephritis w/ suprapubic catheter - Improving / Severe Sepsis - Resolved On admission, refractory to 4 L of fluid with sbp 84 despite low dose levophed. Has been off pressors for most of 11/7. Lactic Acid 11/7: 0.6 and Procalcitonin 0.25. Confirmed ESBL E Coli on urine culture. Suprapubic catheter changed 10/09/15. Clinically improved on current appropriate antibiotic therapy. However had persistent breakthrough fevers, gradual improving, tmax 100.43F overnight, with mild tachycardia. Seems to have less fever spikes  - ID consulted with advice for antibiotic regimen, no longer following - Continue Ertapenem 1g IV daily  (11/11>11/20) to complete total 14 day appropriate antibiotic course, (off imipenem from 11/7 to 11/10) - Blood culture 11/6: coagulase negative staph in one bottle (likely contaminant); second bottle NGTD x 5 day - will re-culture if spikes high grade fever (>101F) - discussed with ID regarding duration of therapy: Dr. Linus Salmons recommends not extending antibiotic therapy but to continue with 14 day course or possibly stopping antibiotic therapy and monitor because patient's sepsis may have been due to her acute cholecystitis rather than UTI.   # Fecal Impaction: on CT in rectosigmoid colon: Patient denies feeling constipated, however states that she is on Miralax BID at home.  - restarted home Miralax (11/14) - consider suppository   # Normocytic Anemia, presumed chronic IDA Hgb 7.0>7.4 > 6.7 (11/10) > 8.2 >7.8>8.8 >8.2 > 7.5; on admission 9.5. No active bleeding noted clinically. FOBT negative.  - 1 unit pRBC transfused 11/10; hgb stable - anemia studies: Ferritin 58; Fe 9; TIBC 241; saturation ratio 4%; folate 16.1; Vit B12 1076 (H) - IV iron with Feraheme on 11/11, will need repeat dose in approx 3 days per pharm; will order second dose 11/15  # Transaminitis, likely in setting of severe sepsis and possible shock liver - Resolved  Initially elevated 366/197/229>>> AST 21 / ALT 29 / T bili 0.6 5  # Hypokalemia: mild with K of 3.2 > 2.8 11/10 > 3.6 > 3.0  >3.5 - Kdur 57mq  x 1 11/10 - K 40 mEq x 1 11/13 - resolved   # Major Depression/Anxiety: - cont home Klonopin  - cont home Seroquel  # GERD: - protonix  # Stage 1 Decubitus Ulcer: erythema of skin  but no skin breakdown. No tenderness.  - prophylaxis with dressing per nursing   # Hx PE with hx of anticoagulation: Per patient, had recent vaginal bleeding which was evaluated at [facility] this month; was told to stop taking anticoagulation until outpatient follow up.  - per chart review in care everywhere, patient was admitted  to [facility] for vaginal bleeding and acute symptomatic anemia in Oct 2016 in the setting of anticoagulation use (xeralto). She was taken to the OR on 09/27/15 for IVC filter placement. At the time of discharge, her Xarelto was held until her follow up appointment in 2 weeks with Dr. Amalia Hailey and a plan will need to be made regarding anticoagulation and her IVC filter at this appointment - Additionally per chart review, patient's hemoglobin had been unstable after initiation of systemic anticoagulation per urology - will continue to hold Xeralto  # Cerebral Palsy - Tizanidine and Baclofen   FEN/GI: normal diet; NS 75cc/hr  PPx: SQ Heparin   Disposition: assisted living facility pending management of complicated UTI/pyelo, since recovered from ICU with urosepsis. To complete IV antibiotics Ertapenem total 14 days, last dose 10/22/15. Now with new dx acute choley, pending surgery eval and OR lap choley on 11/12. - Following CSW to determine SNF on discharge.  Subjective:  - feels a little better from yesterday - still has some nausea - abdomen is still sore  Objective: Temp:  [98.2 F (36.8 C)-100.9 F (38.3 C)] 98.2 F (36.8 C) (11/15 0500) Pulse Rate:  [88-114] 88 (11/15 0500) Resp:  [18] 18 (11/15 0500) BP: (121-142)/(50-81) 122/51 mmHg (11/15 0500) SpO2:  [95 %-99 %] 99 % (11/15 0500) Weight:  [133 lb 12.8 oz (60.691 kg)] 133 lb 12.8 oz (60.691 kg) (11/14 2037) Physical Exam: General: NAD, resting in bed HEENT: Neck contracted to right, left IJ in place (non-tender no erythema);  MMM Cardiovascular: RRR, no m/r/g Respiratory: CTAB bilaterally Abdomen: incisions dry and without erythema; some soreness to palapation; no suprapubic tenderness; suprapubic catheter: no surrounding erythema Extremities: warm and dry Back: stage 1 decubitus ulcer   Laboratory:  Recent Labs Lab 10/15/15 0543 10/16/15 0650 10/17/15 0600  WBC 8.2 6.8 5.6  HGB 8.8* 8.2* 7.5*  HCT 28.9* 26.5* 24.6*   PLT 408* 358 263    Recent Labs Lab 10/14/15 0516 10/15/15 0815 10/16/15 0650 10/17/15 0600  NA 139 135 139 138  K 3.0* 3.1* 3.7 3.5  CL 105 99* 104 104  CO2 _0 BUN <5* <5* <5* <5*  CREATININE 0.39* 0.37* 0.36* 0.38*  CALCIUM 8.1* 8.0* 8.1* 7.8*  PROT 5.2*  --  5.1* 4.9*  BILITOT 0.6  --  0.6 0.6  ALKPHOS 127*  --  248* 199*  ALT 29  --  31 23  AST 21  --  26 21  GLUCOSE 86 94 101* 88   Anemia Panel - Iron 9, TIBC 241, Sat ratio 4, ferritin 58 Folate 16 Vitamin B-12, 1076  Imaging/Diagnostic Tests: ECHO: EF 65-70%; normal  CXR 11/6: IMPRESSION: Negative for pneumonia.  RUQ Abd [place] (11/7) IMPRESSION: Cholelithiasis with indeterminate presence of acute cholecystitis given conflicting findings (there is gallbladder distention and mild wall thickening but no focal tenderness). HIDA scan could evaluate cystic duct patency.  CXR 11/7: IMPRESSION: 1. Left IJ central line with tip at the upper right atrium. No pneumothorax. 2. Progressive hypoventilation and perihilar atelectasis.  CT Abd / Pelvis w/ contrast 11/11 IMPRESSION: 1. Significant distension of the gallbladder, gallstones, and  new gallbladder wall thickening consistent with acute cholecystitis. 2. Small bilateral pleural effusions, body wall edema. 3. Significant stool burden within the rectosigmoid colon suggests fecal impaction. 4. Suprapubic catheter unremarkable in appearance. Urinary bladder wall is thickened. 5. Smaller collections posterior to the urinary bladder compared prior study.  Smiley Houseman, MD 10/17/2015, 6:44 AM PGY-1, [place] Intern pager: [phone], text pages welcome

## 2015-10-18 ENCOUNTER — Inpatient Hospital Stay (HOSPITAL_COMMUNITY): Payer: Medicare Other

## 2015-10-18 LAB — URINE MICROSCOPIC-ADD ON

## 2015-10-18 LAB — CBC
HCT: 21.8 % — ABNORMAL LOW (ref 36.0–46.0)
HCT: 27.9 % — ABNORMAL LOW (ref 36.0–46.0)
Hemoglobin: 6.6 g/dL — CL (ref 12.0–15.0)
Hemoglobin: 8.6 g/dL — ABNORMAL LOW (ref 12.0–15.0)
MCH: 28.1 pg (ref 26.0–34.0)
MCH: 28.1 pg (ref 26.0–34.0)
MCHC: 30.3 g/dL (ref 30.0–36.0)
MCHC: 30.8 g/dL (ref 30.0–36.0)
MCV: 92.8 fL (ref 78.0–100.0)
MCV: 91.2 fL (ref 78.0–100.0)
PLATELETS: 346 10*3/uL (ref 150–400)
Platelets: 232 10*3/uL (ref 150–400)
RBC: 2.35 MIL/uL — ABNORMAL LOW (ref 3.87–5.11)
RBC: 3.06 MIL/uL — ABNORMAL LOW (ref 3.87–5.11)
RDW: 17.2 % — AB (ref 11.5–15.5)
RDW: 17.1 % — AB (ref 11.5–15.5)
WBC: 4.6 10*3/uL (ref 4.0–10.5)
WBC: 6.2 10*3/uL (ref 4.0–10.5)

## 2015-10-18 LAB — URINALYSIS, ROUTINE W REFLEX MICROSCOPIC
Bilirubin Urine: NEGATIVE
GLUCOSE, UA: NEGATIVE mg/dL
Hgb urine dipstick: NEGATIVE
KETONES UR: NEGATIVE mg/dL
NITRITE: NEGATIVE
PH: 7 (ref 5.0–8.0)
PROTEIN: NEGATIVE mg/dL
Specific Gravity, Urine: 1.015 (ref 1.005–1.030)

## 2015-10-18 LAB — COMPREHENSIVE METABOLIC PANEL
ALT: 13 U/L — ABNORMAL LOW (ref 14–54)
ALT: 19 U/L (ref 14–54)
ANION GAP: 4 — AB (ref 5–15)
AST: 12 U/L — ABNORMAL LOW (ref 15–41)
AST: 20 U/L (ref 15–41)
Albumin: 1.3 g/dL — ABNORMAL LOW (ref 3.5–5.0)
Albumin: 2.2 g/dL — ABNORMAL LOW (ref 3.5–5.0)
Alkaline Phosphatase: 123 U/L (ref 38–126)
Alkaline Phosphatase: 200 U/L — ABNORMAL HIGH (ref 38–126)
Anion gap: 6 (ref 5–15)
BILIRUBIN TOTAL: 0.5 mg/dL (ref 0.3–1.2)
BUN: <5 mg/dL — ABNORMAL LOW (ref 6–20)
BUN: <5 mg/dL — ABNORMAL LOW (ref 6–20)
CALCIUM: 5.4 mg/dL — AB (ref 8.9–10.3)
CHLORIDE: 105 mmol/L (ref 101–111)
CO2: 20 mmol/L — AB (ref 22–32)
CO2: 27 mmol/L (ref 22–32)
CREATININE: 0.41 mg/dL — AB (ref 0.44–1.00)
Calcium: 8.2 mg/dL — ABNORMAL LOW (ref 8.9–10.3)
Chloride: 120 mmol/L — ABNORMAL HIGH (ref 101–111)
Creatinine, Ser: <0.3 mg/dL — ABNORMAL LOW (ref 0.44–1.00)
GLUCOSE: 75 mg/dL (ref 65–99)
Glucose, Bld: 138 mg/dL — ABNORMAL HIGH (ref 65–99)
POTASSIUM: 2.5 mmol/L — AB (ref 3.5–5.1)
POTASSIUM: 4.2 mmol/L (ref 3.5–5.1)
SODIUM: 144 mmol/L (ref 135–145)
Sodium: 138 mmol/L (ref 135–145)
TOTAL PROTEIN: 3.4 g/dL — AB (ref 6.5–8.1)
TOTAL PROTEIN: 5.5 g/dL — AB (ref 6.5–8.1)
Total Bilirubin: 0.5 mg/dL (ref 0.3–1.2)

## 2015-10-18 MED ORDER — ALTEPLASE 2 MG IJ SOLR
2.0000 mg | Freq: Once | INTRAMUSCULAR | Status: AC
  Administered 2015-10-18: 2 mg
  Filled 2015-10-18: qty 2

## 2015-10-18 MED ORDER — DIPHENHYDRAMINE HCL 25 MG PO CAPS
25.0000 mg | ORAL_CAPSULE | Freq: Four times a day (QID) | ORAL | Status: DC | PRN
  Administered 2015-10-23 – 2015-10-26 (×2): 25 mg via ORAL
  Filled 2015-10-18 (×3): qty 1

## 2015-10-18 MED ORDER — ALTEPLASE 2 MG IJ SOLR
2.0000 mg | Freq: Once | INTRAMUSCULAR | Status: DC | PRN
  Administered 2015-10-18: 2 mg
  Filled 2015-10-18 (×2): qty 2

## 2015-10-18 NOTE — Progress Notes (Signed)
CRITICAL VALUE ALERT  Critical value received:  Potassium 2.5; Calcium 5.4; Hgb 6.6  Date of notification:  10/18/15  Time of notification:  0745  Critical value read back:Yes.    Nurse who received alert:  Jaicion Laurie rn  MD notified (1st page):  gunadasa  Time of first page:  0750  MD notified (2nd page):  Time of second page:  Responding MD:  Ottie Glaziergunadasa  Time MD responded:  [phone]

## 2015-10-18 NOTE — Progress Notes (Signed)
. Family Medicine Teaching Service Daily Progress Note Intern Pager: [phone]  Patient name: [name] [name] Medical record number: [id number] Date of birth: 03-May-1976 Age: 39 y.o. Gender: female  Primary Care Provider: Luiz Blare, DO Consultants: ID, Surgery Code Status: FULL  Pt Overview and Major Events to Date:  11/6-11/7: Admitted for urosepsis; refractory to 4 litres of fluid with sbp 84 despite low dose levophed. Has been off pressors for most of 11/7. [place] cath changed as well. Started on Vanc and Imipenem  11/8: Transferred to floor from [place] 11/9: discontinued Vanc due to urine cultures showing ESBL and no growth with blood cultures  11/10: hgb 6.7; no active bleeding; transfuse 1 unit pRBC  11/11: Abx switched to Ertapenem; MRCP (unable to be obtain due to baclofen pump) 11/12: Surgery consulted, on CT abd/pelvis dx concern acute choley > to OR today for lap choley  Assessment and Plan:  # Acute Choleycstitis s/p lap choley (11/12) - Confirmed suspicion for likely acute choley on CT Abd / pelvis w contrast (10/13/15) - Consulted General Surgery, greatly appreciate assistance. 11/12 lap choley. - Continue pain control, anti-emetics - Surgery recs: okay to dc when medically ready; surgery f/u arranged - Alk phos: 248 (11/14) > 199  # Breakthrough Fever: tmax 101.22F overnight, with mild tachycardia,despite on abx. No other signs or symptoms of infection. - Skin exam unremarkable: erythematous patch of skin in sacral area without any skin breakdown  - repeat CXR: limited study by poor inspiration, mild basilar atelectasis, no active disease - will follow up on repeat blood and urine cultures   - If patient spikes fever, will consult ID - low threshold to re-consult surgery  - will re-culture if spikes high grade fever (>101F)  # ESBL E Coli Pyelonephritis w/ suprapubic catheter - Improving / Severe Sepsis - Resolved On admission, refractory to 4 L of fluid with sbp  84 despite low dose levophed. Has been off pressors for most of 11/7. Lactic Acid 11/7: 0.6 and Procalcitonin 0.25. Confirmed ESBL E Coli on urine culture. Suprapubic catheter changed 10/09/15. Clinically improved on current appropriate antibiotic therapy. However had persistent breakthrough fevers, gradual improving, tmax 101.22F overnight, with mild tachycardia.  - Continue Ertapenem 1g IV daily (11/11>11/20) to complete total 14 day appropriate antibiotic course, (off imipenem from 11/7 to 11/10) - Blood culture 11/6: coagulase negative staph in one bottle (likely contaminant); second bottle NGTD x 5 day - discussed with ID regarding duration of therapy 11/16: Dr. Linus Salmons recommends not extending antibiotic therapy but to continue with 14 day course or possibly stopping antibiotic therapy and monitor because patient's sepsis may have been due to her acute cholecystitis rather than UTI.   # Fecal Impaction: on CT in rectosigmoid colon: Patient denies feeling constipated, however states that she is on Miralax BID at home.  - restarted home Miralax (11/14) - consider suppository   # Normocytic Anemia, presumed chronic IDA Hgb 7.0>7.4 > 6.7 (11/10) > 8.2 >7.8>8.8 >8.2 > 7.5 > 8.6; on admission 9.5. No active bleeding noted clinically. FOBT negative.  - 1 unit pRBC transfused 11/10; hgb stable - anemia studies: Ferritin 58; Fe 9; TIBC 241; saturation ratio 4%; folate 16.1; Vit B12 1076 (H) - IV iron with Feraheme on 11/11, will need repeat dose in approx 3 days per pharm; will order second dose 11/15  # Transaminitis, likely in setting of severe sepsis and possible shock liver - Resolved  Initially elevated 366/197/229>>> AST 21 / ALT 29 / T bili 0.6  5  # Hypokalemia: mild with K of 3.2 > 2.8 11/10 > 3.6 > 3.0  >3.5 - Kdur 42mq  x 1 11/10 - K 40 mEq x 1 11/13 - resolved   # Major Depression/Anxiety: - cont home Klonopin  - cont home Seroquel  # GERD: - protonix  # Stage 1 Decubitus Ulcer:  erythema of skin but no skin breakdown. No tenderness.  - prophylaxis with dressing per nursing   # Hx PE with hx of anticoagulation: Per patient[facility]; was told to stop taking anticoagulation until outpatient follow up.  - per chart review in care everywhere, [facility] in the setting of anticoagulation use (xeralto). She was taken to the OR on 09/27/15 for IVC filter placement. At the time of discharge, her Xarelto was held until her follow up appointment in 2 weeks with Dr. EAmalia Haileyand a plan will need to be made regarding anticoagulation and her IVC filter at this appointment - Additionally per chart review, patient's hemoglobin had been unstable after initiation of systemic anticoagulation per urology - will continue to hold Xeralto  # Cerebral Palsy - Tizanidine and Baclofen   FEN/GI: normal diet; NS 75cc/hr  PPx: SQ Heparin   Disposition: assisted living facility pending management of complicated UTI/pyelo, since recovered from ICU with urosepsis. To complete IV antibiotics Ertapenem total 14 days, last dose 10/22/15. Now with new dx acute choley, pending surgery eval and OR lap choley on 11/12. - Following CSW to determine SNF on discharge.  Subjective:  - feels a little better from yesterday - still has some nausea - abdomen is still sore  Objective: Temp:  [97.5 F (36.4 C)-101.6 F (38.7 C)] 97.5 F (36.4 C) (11/16 0500) Pulse Rate:  [59-118] 59 (11/16 0500) Resp:  [16-18] 18 (11/16 0500) BP: (97-149)/(50-73) 97/50 mmHg (11/16 0500) SpO2:  [95 %-100 %] 95 % (11/16 0500) Weight:  [134 lb 12.8 oz (61.145 kg)-137 lb 11.2 oz (62.46 kg)] 137 lb 11.2 oz (62.46 kg) (11/16 0500) Physical Exam: General: NAD, resting in bed HEENT: Neck contracted to right, left IJ in place (non-tender no erythema);  MMM Cardiovascular: RRR, no  m/r/g Respiratory: CTAB bilaterally Abdomen: incisions dry and without erythema; some soreness to palapation; no suprapubic tenderness; suprapubic catheter: no surrounding erythema Extremities: warm and dry Back: erythematous patch noted on sacral area without skin breakdown   Laboratory:  Recent Labs Lab 10/15/15 0543 10/16/15 0650 10/17/15 0600  WBC 8.2 6.8 5.6  HGB 8.8* 8.2* 7.5*  HCT 28.9* 26.5* 24.6*  PLT 408* 358 263    Recent Labs Lab 10/16/15 0650 10/17/15 0600 10/18/15 0602  NA 139 138 144  K 3.7 3.5 2.5*  CL 104 104 120*  CO2 30 30 20*  BUN <5* <5* <5*  CREATININE 0.36* 0.38* <0.30*  CALCIUM 8.1* 7.8* 5.4*  PROT 5.1* 4.9* 3.4*  BILITOT 0.6 0.6 0.5  ALKPHOS 248* 199* 123  ALT 31 23 13*  AST 26 21 12*  GLUCOSE 101* 88 75   Anemia Panel - Iron 9, TIBC 241, Sat ratio 4, ferritin 58 Folate 16 Vitamin B-12, 1076  Imaging/Diagnostic Tests: ECHO: EF 65-70%; normal  CXR 11/6: IMPRESSION: Negative for pneumonia.  RUQ Abd [place](11/7) IMPRESSION: Cholelithiasis with indeterminate presence of acute cholecystitis given conflicting findings (there is gallbladder distention and mild wall thickening but no focal tenderness). HIDA  scan could evaluate cystic duct patency.  CXR 11/7: IMPRESSION: 1. Left IJ central line with tip at the upper right atrium. No pneumothorax. 2. Progressive hypoventilation and perihilar atelectasis.  CT Abd / Pelvis w/ contrast 11/11 IMPRESSION: 1. Significant distension of the gallbladder, gallstones, and new gallbladder wall thickening consistent with acute cholecystitis. 2. Small bilateral pleural effusions, body wall edema. 3. Significant stool burden within the rectosigmoid colon suggests fecal impaction. 4. Suprapubic catheter unremarkable in appearance. Urinary bladder wall is thickened. 5. Smaller collections posterior to the urinary bladder compared prior study.  Smiley Houseman, MD 10/18/2015, 7:36 AM PGY-1,  [place] Intern pager: [phone], text pages welcome

## 2015-10-18 NOTE — Clinical Social Work Note (Signed)
Patient and significant other requested to speak with CSW. CSW intern spoke with patient and her significant other in the room regarding patient's discharging plans. Patient stated that she had contacted and spoke with [name]. According to the patient, [name] [name] informed her that there were Medicaid beds available at the facility. CSW intern [facility] and left a message for [name]. Patient and her significant other had no further questions or comments at this time.

## 2015-10-18 NOTE — Progress Notes (Signed)
IV nurse attempted to draw blood from pt's cvc and was unsucessful.  States, "flushes well but no blood return".  Called phlebotomy at this time and notified them to stick pt for STAT labs.  Will notify md

## 2015-10-19 DIAGNOSIS — B9620 Unspecified Escherichia coli [E. coli] as the cause of diseases classified elsewhere: Secondary | ICD-10-CM

## 2015-10-19 DIAGNOSIS — K219 Gastro-esophageal reflux disease without esophagitis: Secondary | ICD-10-CM

## 2015-10-19 DIAGNOSIS — Z960 Presence of urogenital implants: Secondary | ICD-10-CM

## 2015-10-19 DIAGNOSIS — Z9049 Acquired absence of other specified parts of digestive tract: Secondary | ICD-10-CM

## 2015-10-19 DIAGNOSIS — R509 Fever, unspecified: Secondary | ICD-10-CM

## 2015-10-19 DIAGNOSIS — Z1612 Extended spectrum beta lactamase (ESBL) resistance: Secondary | ICD-10-CM

## 2015-10-19 DIAGNOSIS — G809 Cerebral palsy, unspecified: Secondary | ICD-10-CM

## 2015-10-19 LAB — COMPREHENSIVE METABOLIC PANEL
ALBUMIN: 2.2 g/dL — AB (ref 3.5–5.0)
ALK PHOS: 184 U/L — AB (ref 38–126)
ALT: 16 U/L (ref 14–54)
ANION GAP: 6 (ref 5–15)
AST: 11 U/L — AB (ref 15–41)
BILIRUBIN TOTAL: 0.7 mg/dL (ref 0.3–1.2)
CALCIUM: 8.3 mg/dL — AB (ref 8.9–10.3)
CO2: 29 mmol/L (ref 22–32)
CREATININE: 0.37 mg/dL — AB (ref 0.44–1.00)
Chloride: 104 mmol/L (ref 101–111)
GFR calc Af Amer: >60 mL/min (ref >60)
GFR calc non Af Amer: >60 mL/min (ref >60)
GLUCOSE: 106 mg/dL — AB (ref 65–99)
Potassium: 3.4 mmol/L — ABNORMAL LOW (ref 3.5–5.1)
Sodium: 139 mmol/L (ref 135–145)
TOTAL PROTEIN: 5.4 g/dL — AB (ref 6.5–8.1)

## 2015-10-19 LAB — DIFFERENTIAL
BASOS PCT: 0 %
Basophils Absolute: 0 10*3/uL (ref 0.0–0.1)
EOS ABS: 0.2 10*3/uL (ref 0.0–0.7)
EOS PCT: 3 %
Lymphocytes Relative: 27 %
Lymphs Abs: 1.6 10*3/uL (ref 0.7–4.0)
MONO ABS: 0.6 10*3/uL (ref 0.1–1.0)
MONOS PCT: 9 %
Neutro Abs: 3.7 10*3/uL (ref 1.7–7.7)
Neutrophils Relative %: 61 %

## 2015-10-19 LAB — CBC
HEMATOCRIT: 27.6 % — AB (ref 36.0–46.0)
HEMOGLOBIN: 8.5 g/dL — AB (ref 12.0–15.0)
MCH: 28.3 pg (ref 26.0–34.0)
MCHC: 30.8 g/dL (ref 30.0–36.0)
MCV: 92 fL (ref 78.0–100.0)
Platelets: 356 10*3/uL (ref 150–400)
RBC: 3 MIL/uL — AB (ref 3.87–5.11)
RDW: 17.1 % — ABNORMAL HIGH (ref 11.5–15.5)
WBC: 5.9 10*3/uL (ref 4.0–10.5)

## 2015-10-19 MED ORDER — POTASSIUM CHLORIDE CRYS ER 20 MEQ PO TBCR: 20.0000 meq | EXTENDED_RELEASE_TABLET | Freq: Two times a day (BID) | ORAL | Status: DC

## 2015-10-19 MED ORDER — POTASSIUM CHLORIDE CRYS ER 20 MEQ PO TBCR
20.0000 meq | EXTENDED_RELEASE_TABLET | Freq: Two times a day (BID) | ORAL | Status: AC
  Administered 2015-10-19 (×2): 20 meq via ORAL
  Filled 2015-10-19 (×2): qty 1

## 2015-10-19 NOTE — Progress Notes (Signed)
Patient told family member at bedside that she was experiencing chest pain. Upon assessment, patient pointed to the location of her pain and stated it was a sharp pain that only happened when she took a deep breath. It appeared to be more epigastric than chest pain. She stated this has happened before and the doctor's did an xray that was negative. Dr. Sampson GoonFitzgerald notified of this. No new orders. Patient's nurse Herbert SetaHeather also notified.   Leanna BattlesEckelmann, Evertte Sones Eileen, RN.

## 2015-10-19 NOTE — Progress Notes (Signed)
Checked on patient at bedside due to complaint of chest pain with deep breaths. Patient stated this has been happening since she was admitted and that pain has gotten a little better since admission, but she wanted to know if this was normal. Patient had mild TTP of RUQ, and pointed to RUQ when describing where chest pain was. Explained that no new tests were needed at this time but that she should let her nurse or her doctors know if the pain gets worse. Likely postoperative pain. Patient may benefit from incentive spirometry.

## 2015-10-19 NOTE — Clinical Social Work Note (Signed)
Patient requested to see CSW regarding f/u [facility]. Patient informed that CSW will contact facility on Friday regarding her request to discharge to this facility when medically stable.  Genelle BalVanessa Sibel Khurana, MSW, LCSW Licensed Clinical Social Worker Clinical Social Work Department [facility] [phone]

## 2015-10-19 NOTE — Progress Notes (Signed)
[name]  Total days of antibiotics 11        Day  8 ertapenem               Reason for Consult: FUO    Referring Physician: eniola  Active Problems:   CP (cerebral palsy), spastic ([name])   Chronic suprapubic catheter ([name])   Shock ([place])   Pressure ulcer   Sepsis ([place])   Septic shock ([place])   Anemia due to other cause   RUQ abdominal tenderness   RUQ pain   Cholecystitis, acute   Fever    HPI: [name] [name] is a 39 y.o. female with Cerebral palsy, suprapubic cath, hx of esbl complicated utik spinal cord stimulator who was admitted roughly 11 days ago for fever, malaise, hypotension. She has not had any leukocytosis but did have hypotension initially requiring IVF plus low dose levophed. She was placed on imipenem empirically thought to be due to ecoli esbl uti due to + urine cx. She continued to have daily fevers without improvement. This led to CT abdomen looking for possibly nephrolithiasis but instead was found to have acute cholecystitis. She is POD #5 s/p lab chole but still persists to have daily fevers.  Primary team has recultured blood and urine yesterday due to her fevers. Her wbc remains WNL though no differential has been done.  Past Medical History  Diagnosis Date  . Cerebral palsy ([place])   . Pulmonary embolism ([name])     Lifetime Coumadin  . Cerebral palsy ([name])     Spastic Cerebral palsy, mentally intact  . Contracture, joint, multiple sites     Electric wheelchair, uses left hand to operate chair.   [name] Kitchen Hypertension   . OCD (obsessive compulsive disorder)   . Depression   . Migraine   . [name]   . Dysarthria   . Endometriosis   . History of recurrent UTIs   . GERD (gastroesophageal reflux disease)   . Pulmonary embolism ([place]) 2011, 01/2011    Will be on lifetime coumadin  . Anemia   . Abdominal pain 01/02/2012    Overview:  Overview:  Per Previous pcp note- Dr. Ta--Pt has history of endometriosis and  had DUB in 2011.  She She had u/s that showed normal endometrial stripe.  She had endometrial bx that was wnl.  She has a lot of abd cramping every month.  This cramping was sometimes not relieved with hydrocodone.  She was referred to [facility] for IUD placement to help endometriosis and abd cramping.  [name]  . Macroscopic hematuria 03/10/2013    status post replacement of suprapubic tube 03/04/2013   . HYPERTENSION, BENIGN 01/31/2010    Qualifier: Diagnosis of  By: [name] Laws, MD, [name] [name] History of endometriosis 10/2012    OBGYN [facility]: Laparoscopic Endometrial Ablation, TVH,  . Gram positive sepsis ([place]) 10/24/2014  . HCAP (healthcare-associated pneumonia) 10/25/2014  . Abdominal pain 01/02/2012    Overview:  Overview:  Per Previous pcp note- Dr. Ta--Pt has history of endometriosis and had DUB in 2011.  She She had u/s that showed normal endometrial stripe.  She had endometrial bx that was wnl.  She has a lot of abd cramping every month.  This cramping was sometimes not relieved with hydrocodone.  She was referred to [facility] for IUD placement to help endometriosis and abd cramping.  [name] was placed 04/30/11 by [facility].  Pt still complains of abd  cramping, which I am treating with hydrocodone and ultram. Pt reports some initial relief of pain from IUD, but states it has now returned back to similar to previous cramping. Gave patient a shot of Toradol 60 mg IM today-since positive exacerbation of abdominal cramping during past 2 weeks. Physical exam reassuring. Patient may need to return to [place] for further workup since she is plugged in with the GYN providers there for further treatment options regarding endometriosis and abdominal cramping. The dysfunctional uterine bleeding now well contr  . Acute respiratory failure with hypoxia ([place])   . Cellulitis 12/22/2014  . Disease of female genital organs 10/24/2010    Overview:  Overview:  Pt has history of endometriosis and had DUB in 2011.   She had u/s that showed normal endometrial stripe.  She had endometrial bx that was wnl.  She has a lot of abd cramping every month.  This cramping was sometimes not relieved with hydrocodone.  She was referred to [facility] for IUD placement to help endometriosis and abd cramping.  [name] was placed 04/30/11 by [facility].  Pt still complains of abd cramping, which was treated with hydrocodone and ultram.   Last seen at [facility] by OB/GYN 12/24/11, given Depo Lupron injection.  Ordering CT scan to r/o additional cause of abdominal pain (pt with known endometriosis and will not tolerate vaginal U/S, pain not improved s/p [name] or with Lupron injection-- only caused hot flashes and amenorrhea, no pain relief).  Also made referral to pain clinic for better pain control.  Do not think hysterectomy would help with pain, and concern for contamination of baclofen pump if did proceed with surgery.    8/5/13[name]: Pt was seen by   . Dyspnea and respiratory abnormality 02/04/2011    Overview:  Overview:  Pt has history of recurrent PE and is on chronic coumadin.  Pt has been complaining of dyspnea that she describes as chest tightness.  She has been to the ER multiple times for this.  Usually she gets CTA and serial CE.  She has had at least 6 CTA in a period of since 2011.  She also has an O2 requirement. It is unclear why pt feels dyspneic, complains of chest tightness, or has O2 requirement.  I thought that her complaint may be from deconditioning and referred her to PT.  Unfortunately Medicaid only pays for 4 PT visits.  Pt should be doing these exercises at home, but she has not.  I have referred her to [facility], and she has been seen by Dr Chase Caller.  He did not understand the etiology of her dyspnea and O2 requirement.  He will continue to follow her.   Last Assessment & Plan:  Pt presents with concerns for chest tightness and dyspnea.  She was seen in ED yesterday and discharged home.  While there she had a CT  chest 04/20/11 showed 1.  Multifocal degradation as detailed above.  N  . Gross hematuria 03/12/2013    Overview:  Overview:  status post replacement of suprapubic tube 03/04/2013  Last Assessment & Plan:  Recent urine culture collected in ED on 5/4 did not result in significant growth. Moreover, patient has been asymptomatic with normal WBC, no fever and no true symptoms making symptomatic UTI less likely.  Will continue to monitor for any sign of infection.  Patient's xarelto had been held during her recent hospitalization due to hematuria. This has since been re-started. Monitor bleeding.    [name] Kitchen History  of anticoagulant therapy 10/21/2012  . Infantile cerebral palsy ([place]) 01/29/2007    Overview:  She has spastic CP.  She is mentally intact.  She is wheelchair bound.  She is wheelchair bound for ambulation      . Intracervical pessary 05/07/2011    Overview:  Overview:  Placed by [facility] 04/30/11 to treat DUB and Endometriosis.  She is seen at GYN clinic at [facility] but was considered a poor surgical candidate and referred her to [facility].  For DUB she had pelvic ultrasound that showed thin stripe and she had endometrial Bx by Dr Nori Riis in [place] clinic, which was negative.     . Parapneumonic effusion 10/25/2014  . Presence of intrathecal baclofen pump 03/07/2014    R LQ. Insertion T10.    [name] Kitchen Psychiatric illness 10/31/2014  . Recurrent pulmonary embolism ([place]) 05/03/2011    Pt has history of recurrent PE and is on chronic coumadin.  Pt has been complaining of dyspnea that she describes as chest tightness.  She has been to the ER multiple times for this.  Usually she gets CTA and serial CE.  She has had at least 6 CTA in a period of since 2011.  She also has an O2 requirement. It is unclear why pt feels dyspneic, complains of chest tightness, or has O2 requirement.  I thought that her complaint may be from deconditioning and referred her to PT.  Unfortunately Medicaid only pays for 4 PT visits.  Pt should be doing  these exercises at home, but she has not.  I have referred her to [name], and she has been seen by Dr Chase Caller.  He did not understand the etiology of her dyspnea and O2 requirement.  He will continue to follow her.     . Seborrheic keratosis 01/18/2011  . Spasticity 01/05/2014  . Transient alteration of awareness, recurrent 09/08/2006    Recurrent episodes where [place]  loses contact with her world and that have been studied thoroughly and appear nonepileptic in nature per Dr Krista Blue (neurology) The patient has had episodes starting in 2007 that initially began 1-4 times a day during which time she would have periods of unawareness followed by confusion. This continuous video EEG monitoring performed from October 8-12, 2007. Had   . Acute cystitis without hematuria   . Acute cystitis with hematuria   . Urethra dilated and patulous 07/03/2015    Patulous, Dilated Urethra. 67m balloon on a 22-Fr catheter hoping this would prevent the bladder spasm from pushing the balloon down the urethra (Dr EAmalia [facility], July 2016)     Allergies:  Allergies  Allergen Reactions  . Morphine Dermatitis and Other (See Comments)    Skin turned red    Current antibiotics:   MEDICATIONS: . baclofen  20 mg Oral QID  . clonazePAM  0.5 mg Oral 3 times per day  . ertapenem  1 g Intravenous Q24H  . heparin subcutaneous  5,000 Units Subcutaneous 3 times per day  . pantoprazole  40 mg Oral QHS  . potassium chloride  20 mEq Oral BID WC  . QUEtiapine  100 mg Oral QHS  . tiZANidine  4 mg Oral 2 times per day    Social History  Substance Use Topics  . Smoking status: Never Smoker   . Smokeless tobacco: Never Used  . Alcohol Use: 0.6 oz/week    1 Glasses of wine per week     Comment: Drinks alcohol once a month.  Family History  Problem Relation Age of Onset  . Asthma Father   . Colon cancer Maternal Grandmother     Died in her 36's  . Cancer Paternal Grandmother   . Breast cancer Paternal Grandmother      Died in her 28's  . Heart attack Maternal Grandfather     Died in his 1's  . Alzheimer's disease Paternal Grandfather     Died in his 67's    Review of Systems - + fevers, occasional chest pain when eating or deep inspiration   OBJECTIVE: Temp:  [97.5 F (36.4 C)-102.9 F (39.4 C)] 97.7 F (36.5 C) (11/17 0948) Pulse Rate:  [67-135] 70 (11/17 0948) Resp:  [16-22] 18 (11/17 0948) BP: (114-143)/(58-92) 114/58 mmHg (11/17 0948) SpO2:  [93 %-98 %] 98 % (11/17 0948) Physical Exam  Constitutional:  oriented to person, place, and time. appears well-developed and well-nourished. No distress.  HENT: [place]/AT, PERRLA, no scleral icterus. Neck contracted to the left Mouth/Throat: Oropharynx is clear and moist. No oropharyngeal exudate. Dry oral mucosa. Poor dentition Cardiovascular: Normal rate, regular rhythm and normal heart sounds. Exam reveals no gallop and no friction rub.  No murmur heard.  Pulmonary/Chest: Effort normal and breath sounds normal. No respiratory distress.  has no wheezes.  Neck = supple, no nuchal rigidity Abdominal: Soft. Bowel sounds are decreased.  exhibits no distension. There is no tenderness. Firmness to RLQ. Suprapubic cath in place. Surgical incision are healing. No signs of erythema Lymphadenopathy: no cervical adenopathy. No axillary adenopathy Ext: multiple joint contracture to upper extremities and lower extremities Neuro : flaccid lower extremities with spasticity Skin: Skin is warm and dry. No rash noted. No erythema.    LABS: Results for orders placed or performed during the hospital encounter of 10/08/15 (from the past 48 hour(s))  Culture, blood (routine x 2)     Status: None (Preliminary result)   Collection Time: 10/18/15  4:02 AM  Result Value Ref Range   Specimen Description BLOOD LEFT WRIST    Special Requests BOTTLES DRAWN AEROBIC AND ANAEROBIC 10CC     Culture NO GROWTH 1 DAY    Report Status PENDING   Culture, blood (routine x 2)      Status: None (Preliminary result)   Collection Time: 10/18/15  4:10 AM  Result Value Ref Range   Specimen Description BLOOD RIGHT ANTECUBITAL    Special Requests BOTTLES DRAWN AEROBIC AND ANAEROBIC 10CC     Culture NO GROWTH 1 DAY    Report Status PENDING   CBC     Status: Abnormal   Collection Time: 10/18/15  6:02 AM  Result Value Ref Range   WBC 4.6 4.0 - 10.5 K/uL    Comment: REPEATED TO VERIFY   RBC 2.35 (L) 3.87 - 5.11 MIL/uL   Hemoglobin 6.6 (LL) 12.0 - 15.0 g/dL    Comment: REPEATED TO VERIFY CRITICAL RESULT CALLED TO, READ BACK BY AND VERIFIED WITH: Harlan Stains, RN [phone] 10/18/15 BY MACEDA, J    HCT 21.8 (L) 36.0 - 46.0 %   MCV 92.8 78.0 - 100.0 fL   MCH 28.1 26.0 - 34.0 pg   MCHC 30.3 30.0 - 36.0 g/dL   RDW 17.2 (H) 11.5 - 15.5 %   Platelets 232 150 - 400 K/uL    Comment: REPEATED TO VERIFY  Comprehensive metabolic panel     Status: Abnormal   Collection Time: 10/18/15  6:02 AM  Result Value Ref Range   Sodium 144 135 - 145  mmol/L   Potassium 2.5 (LL) 3.5 - 5.1 mmol/L    Comment: CRITICAL RESULT CALLED TO, READ BACK BY AND VERIFIED WITH: WRIGHT,M RN @ [phone] 10/18/15 LEONARD,A    Chloride 120 (H) 101 - 111 mmol/L   CO2 20 (L) 22 - 32 mmol/L   Glucose, Bld 75 65 - 99 mg/dL   BUN <5 (L) 6 - 20 mg/dL   Creatinine, Ser <0.30 (L) 0.44 - 1.00 mg/dL   Calcium 5.4 (LL) 8.9 - 10.3 mg/dL    Comment: CRITICAL RESULT CALLED TO, READ BACK BY AND VERIFIED WITH: WRIGHT,M RN @ 1517 10/18/15 LEONARD,A    Total Protein 3.4 (L) 6.5 - 8.1 g/dL   Albumin 1.3 (L) 3.5 - 5.0 g/dL   AST 12 (L) 15 - 41 U/L   ALT 13 (L) 14 - 54 U/L   Alkaline Phosphatase 123 38 - 126 U/L   Total Bilirubin 0.5 0.3 - 1.2 mg/dL   GFR calc non Af Amer NOT CALCULATED >60 mL/min   GFR calc Af Amer NOT CALCULATED >60 mL/min    Comment: (NOTE) The eGFR has been calculated using the CKD EPI equation. This calculation has not been validated in all clinical situations. eGFR's persistently <60 mL/min signify  possible Chronic Kidney Disease.    Anion gap 4 (L) 5 - 15  Urinalysis, Routine w reflex microscopic (not at [facility])     Status: Abnormal   Collection Time: 10/18/15  6:36 AM  Result Value Ref Range   Color, Urine YELLOW YELLOW   APPearance CLOUDY (A) CLEAR   Specific Gravity, Urine 1.015 1.005 - 1.030   pH 7.0 5.0 - 8.0   Glucose, UA NEGATIVE NEGATIVE mg/dL   Hgb urine dipstick NEGATIVE NEGATIVE   Bilirubin Urine NEGATIVE NEGATIVE   Ketones, ur NEGATIVE NEGATIVE mg/dL   Protein, ur NEGATIVE NEGATIVE mg/dL   Nitrite NEGATIVE NEGATIVE   Leukocytes, UA SMALL (A) NEGATIVE  Culture, Urine     Status: None (Preliminary result)   Collection Time: 10/18/15  6:36 AM  Result Value Ref Range   Specimen Description URINE, SUPRAPUBIC    Special Requests NONE    Culture TOO YOUNG TO READ    Report Status PENDING   Urine microscopic-add on     Status: Abnormal   Collection Time: 10/18/15  6:36 AM  Result Value Ref Range   Squamous Epithelial / LPF 0-5 (A) NONE SEEN    Comment: Please note change in reference range.   WBC, UA 6-30 0 - 5 WBC/hpf    Comment: Please note change in reference range.   RBC / HPF 0-5 0 - 5 RBC/hpf    Comment: Please note change in reference range.   Bacteria, UA FEW (A) NONE SEEN    Comment: Please note change in reference range.   Crystals CA OXALATE CRYSTALS (A) NEGATIVE   Urine-Other MUCOUS PRESENT     Comment: YEAST PRESENT  CBC     Status: Abnormal   Collection Time: 10/18/15 10:41 AM  Result Value Ref Range   WBC 6.2 4.0 - 10.5 K/uL    Comment: SPECIMEN CHECKED FOR CLOTS REPEATED TO VERIFY RESULTS VERIFIED VIA RECOLLECT    RBC 3.06 (L) 3.87 - 5.11 MIL/uL   Hemoglobin 8.6 (L) 12.0 - 15.0 g/dL    Comment: SPECIMEN CHECKED FOR CLOTS REPEATED TO VERIFY RESULTS VERIFIED VIA RECOLLECT    HCT 27.9 (L) 36.0 - 46.0 %   MCV 91.2 78.0 - 100.0 fL   MCH 28.1  26.0 - 34.0 pg   MCHC 30.8 30.0 - 36.0 g/dL   RDW 17.1 (H) 11.5 - 15.5 %   Platelets 346 150 - 400  K/uL    Comment: SPECIMEN CHECKED FOR CLOTS REPEATED TO VERIFY RESULTS VERIFIED VIA RECOLLECT   Comprehensive metabolic panel     Status: Abnormal   Collection Time: 10/18/15 10:41 AM  Result Value Ref Range   Sodium 138 135 - 145 mmol/L   Potassium 4.2 3.5 - 5.1 mmol/L   Chloride 105 101 - 111 mmol/L   CO2 27 22 - 32 mmol/L   Glucose, Bld 138 (H) 65 - 99 mg/dL   BUN <5 (L) 6 - 20 mg/dL   Creatinine, Ser 0.41 (L) 0.44 - 1.00 mg/dL   Calcium 8.2 (L) 8.9 - 10.3 mg/dL   Total Protein 5.5 (L) 6.5 - 8.1 g/dL   Albumin 2.2 (L) 3.5 - 5.0 g/dL   AST 20 15 - 41 U/L   ALT 19 14 - 54 U/L   Alkaline Phosphatase 200 (H) 38 - 126 U/L   Total Bilirubin 0.5 0.3 - 1.2 mg/dL   GFR calc non Af Amer >60 >60 mL/min   GFR calc Af Amer >60 >60 mL/min    Comment: (NOTE) The eGFR has been calculated using the CKD EPI equation. This calculation has not been validated in all clinical situations. eGFR's persistently <60 mL/min signify possible Chronic Kidney Disease.    Anion gap 6 5 - 15  CBC     Status: Abnormal   Collection Time: 10/19/15  5:55 AM  Result Value Ref Range   WBC 5.9 4.0 - 10.5 K/uL   RBC 3.00 (L) 3.87 - 5.11 MIL/uL   Hemoglobin 8.5 (L) 12.0 - 15.0 g/dL   HCT 27.6 (L) 36.0 - 46.0 %   MCV 92.0 78.0 - 100.0 fL   MCH 28.3 26.0 - 34.0 pg   MCHC 30.8 30.0 - 36.0 g/dL   RDW 17.1 (H) 11.5 - 15.5 %   Platelets 356 150 - 400 K/uL  Comprehensive metabolic panel     Status: Abnormal   Collection Time: 10/19/15  5:55 AM  Result Value Ref Range   Sodium 139 135 - 145 mmol/L   Potassium 3.4 (L) 3.5 - 5.1 mmol/L   Chloride 104 101 - 111 mmol/L   CO2 29 22 - 32 mmol/L   Glucose, Bld 106 (H) 65 - 99 mg/dL   BUN <5 (L) 6 - 20 mg/dL   Creatinine, Ser 0.37 (L) 0.44 - 1.00 mg/dL   Calcium 8.3 (L) 8.9 - 10.3 mg/dL   Total Protein 5.4 (L) 6.5 - 8.1 g/dL   Albumin 2.2 (L) 3.5 - 5.0 g/dL   AST 11 (L) 15 - 41 U/L   ALT 16 14 - 54 U/L   Alkaline Phosphatase 184 (H) 38 - 126 U/L   Total Bilirubin  0.7 0.3 - 1.2 mg/dL   GFR calc non Af Amer >60 >60 mL/min   GFR calc Af Amer >60 >60 mL/min    Comment: (NOTE) The eGFR has been calculated using the CKD EPI equation. This calculation has not been validated in all clinical situations. eGFR's persistently <60 mL/min signify possible Chronic Kidney Disease.    Anion gap 6 5 - 15   *Note: Due to a large number of results and/or encounters for the requested time period, some results have not been displayed. A complete set of results can be found in Results Review.  MICRO:  IMAGING: Dg Chest 2 View  10/18/2015  CLINICAL DATA:  Possible pneumonia EXAM: CHEST  2 VIEW COMPARISON:  10/09/2015 FINDINGS: The study is limited by poor inspiration. Left IJ central line is unchanged in position. No segmental infiltrate or pulmonary edema. Mild basilar atelectasis. IMPRESSION: Limited study by poor inspiration. Cardiomediastinal silhouette is stable. Mild basilar atelectasis. No active disease. Electronically Signed   By: Lahoma Crocker M.D.   On: 10/18/2015 14:31    HISTORICAL MICRO/IMAGING  Assessment/Plan:  39yo F with CP who was initially admitted for urosepsis treated for ESBL ecoli complicated uti but also found to have acute cholecystitis POD#5 s/p lap chole, has had persistent fever x 10 days  FUO = the patient has had adequate treatment for uti and cholecystitis. Recommend to stop and observe off of treatment and wonder if this is drug fever from ertapenem.  Complicated UTI = she has finished 10 days of carbapenem and would not necessarily need further treatment. Can consider in the future treating with oral fosfomycin  Acute cholecystitis = she has had 5 days post surgical treatment. Recommend to stop and monitor  GERD = continue with pantoprazole  If fever after 48 hrs of stopping abtx, would check blood cx off of abtx  Akire Rennert B. [place] [facility] [phone]

## 2015-10-19 NOTE — Progress Notes (Signed)
Patient ID: [name] [name], female   DOB: 1976/02/20, 39 y.o.   MRN: [id number] 5 Days Post-Op  Subjective: Woke patient up.  Eating some.  Doesn't complain of any pain.  Still with some mild intermittent nausea as best as I can tell, but drinking well.  Objective: Vital signs in last 24 hours: Temp:  [97.5 F (36.4 C)-102.9 F (39.4 C)] 97.5 F (36.4 C) (11/17 0535) Pulse Rate:  [67-135] 67 (11/17 0535) Resp:  [16-22] 16 (11/17 0535) BP: (105-143)/(49-92) 122/66 mmHg (11/17 0535) SpO2:  [93 %-97 %] 97 % (11/17 0535) Last BM Date: 10/18/15  Intake/Output from previous day: 11/16 0701 - 11/17 0700 In: 1580 [P.O.:680; I.V.:900] Out: 2275 [Urine:2275] Intake/Output this shift:    PE: Abd: soft, appropriately tender over her incisions, no erythema around her incisions or over her baclofen pump, +BS, ND  Lab Results:   Recent Labs  10/18/15 1041 10/19/15 0555  WBC 6.2 5.9  HGB 8.6* 8.5*  HCT 27.9* 27.6*  PLT 346 356   BMET  Recent Labs  10/18/15 1041 10/19/15 0555  NA 138 139  K 4.2 3.4*  CL 105 104  CO2 27 29  GLUCOSE 138* 106*  BUN <5* <5*  CREATININE 0.41* 0.37*  CALCIUM 8.2* 8.3*   PT/INR No results for input(s): LABPROT, INR in the last 72 hours. CMP     Component Value Date/Time   NA 139 10/19/2015 0555   NA 139 03/30/2015 2149   K 3.4* 10/19/2015 0555   CL 104 10/19/2015 0555   CO2 29 10/19/2015 0555   GLUCOSE 106* 10/19/2015 0555   BUN <5* 10/19/2015 0555   BUN 16 03/30/2015 2149   CREATININE 0.37* 10/19/2015 0555   CREATININE 0.5 03/30/2015 2149   CREATININE 0.75 05/10/2011 1218   CALCIUM 8.3* 10/19/2015 0555   PROT 5.4* 10/19/2015 0555   ALBUMIN 2.2* 10/19/2015 0555   AST 11* 10/19/2015 0555   ALT 16 10/19/2015 0555   ALKPHOS 184* 10/19/2015 0555   BILITOT 0.7 10/19/2015 0555   GFRNONAA >60 10/19/2015 0555   GFRAA >60 10/19/2015 0555   Lipase     Component Value Date/Time   LIPASE 18 10/12/2015 1525       Studies/Results: Dg  Chest 2 View  10/18/2015  CLINICAL DATA:  Possible pneumonia EXAM: CHEST  2 VIEW COMPARISON:  10/09/2015 FINDINGS: The study is limited by poor inspiration. Left IJ central line is unchanged in position. No segmental infiltrate or pulmonary edema. Mild basilar atelectasis. IMPRESSION: Limited study by poor inspiration. Cardiomediastinal silhouette is stable. Mild basilar atelectasis. No active disease. Electronically Signed   By: Lahoma Crocker M.D.   On: 10/18/2015 14:31    Anti-infectives: Anti-infectives    Start     Dose/Rate Route Frequency Ordered Stop   10/13/15 0600  ertapenem (INVANZ) 1 g in sodium chloride 0.9 % 50 mL IVPB     1 g 100 mL/hr over 30 Minutes Intravenous Every 24 hours 10/12/15 1519 10/23/15 0559   10/09/15 2000  vancomycin (VANCOCIN) IVPB 750 mg/150 ml premix  Status:  Discontinued     750 mg 150 mL/hr over 60 Minutes Intravenous Every 12 hours 10/09/15 1134 10/11/15 1427   10/09/15 1200  imipenem-cilastatin (PRIMAXIN) 250 mg in sodium chloride 0.9 % 100 mL IVPB     250 mg 200 mL/hr over 30 Minutes Intravenous 4 times per day 10/09/15 1134 10/13/15 0553   10/09/15 1100  vancomycin (VANCOCIN) 500 mg in sodium chloride 0.9 % 100 mL  IVPB  Status:  Discontinued     500 mg 100 mL/hr over 60 Minutes Intravenous Every 8 hours 10/09/15 0229 10/09/15 0429   10/09/15 1000  piperacillin-tazobactam (ZOSYN) IVPB 3.375 g  Status:  Discontinued     3.375 g 12.5 mL/hr over 240 Minutes Intravenous Every 8 hours 10/09/15 0229 10/09/15 0428   10/09/15 0800  piperacillin-tazobactam (ZOSYN) IVPB 3.375 g  Status:  Discontinued     3.375 g 12.5 mL/hr over 240 Minutes Intravenous Every 8 hours 10/09/15 0428 10/09/15 0920   10/09/15 0800  vancomycin (VANCOCIN) 500 mg in sodium chloride 0.9 % 100 mL IVPB  Status:  Discontinued     500 mg 100 mL/hr over 60 Minutes Intravenous Every 8 hours 10/09/15 0429 10/09/15 1134   10/09/15 0230  piperacillin-tazobactam (ZOSYN) IVPB 3.375 g  Status:   Discontinued     3.375 g 12.5 mL/hr over 240 Minutes Intravenous Every 8 hours 10/09/15 0221 10/09/15 0428   10/09/15 0230  vancomycin (VANCOCIN) IVPB 1000 mg/200 mL premix  Status:  Discontinued     1,000 mg 200 mL/hr over 60 Minutes Intravenous  Once 10/09/15 0221 10/09/15 0427   10/09/15 0045  piperacillin-tazobactam (ZOSYN) IVPB 3.375 g     3.375 g 100 mL/hr over 30 Minutes Intravenous  Once 10/09/15 0032 10/09/15 0217   10/09/15 0045  vancomycin (VANCOCIN) IVPB 1000 mg/200 mL premix     1,000 mg 200 mL/hr over 60 Minutes Intravenous  Once 10/09/15 0032 10/09/15 0243   10/09/15 0030  cefTRIAXone (ROCEPHIN) 1 g in dextrose 5 % 50 mL IVPB     1 g 100 mL/hr over 30 Minutes Intravenous  Once 10/09/15 0018 10/09/15 0107       Assessment/Plan  1. POD 5, s/p lap chole -patient seems to be doing well from her surgery.  I do not think her fevers are secondary to anything surgical.  Her LFTs are essentially normal with the exception of her alk phos, which alone doesn't give indication to a complication such as a bile leak.  Her abdominal exam is completely benign which would argue against a bile leak or post op abscess as well. -cont on her regular diet as tolerates 2. UTI/Bacteremia -patient still growing E. Coli in her urine and has ESBL bacteremia.  Suspect her fevers are still secondary to this. -will defer to primary team for treatment for this.  She is still surgically stable for DC when medically stable for DC.   LOS: 10 days    Chrisandra Wiemers E 10/19/2015, 8:14 AM Pager: [phone]

## 2015-10-19 NOTE — Progress Notes (Signed)
. Family Medicine Teaching Service Daily Progress Note Intern Pager: [phone]  Patient name: [name] [name] Medical record number: [id number] Date of birth: [birth year] Age: 39 y.o. Gender: female  Primary Care Provider: Luiz Blare, DO Consultants: ID, Surgery Code Status: FULL  Pt Overview and Major Events to Date:  11/6-11/7: Admitted for urosepsis; refractory to 4 litres of fluid with sbp 84 despite low dose levophed. Has been off pressors for most of 11/7. [place] cath changed as well. Started on Vanc and Imipenem  11/8: Transferred to floor from [place] 11/9: discontinued Vanc due to urine cultures showing ESBL and no growth with blood cultures  11/10: hgb 6.7; no active bleeding; transfuse 1 unit pRBC  11/11: Abx switched to Ertapenem; MRCP (unable to be obtain due to baclofen pump) 11/12: Surgery consulted, on CT abd/pelvis dx concern acute choley > to OR today for lap choley  Assessment and Plan:  # Acute Choleycstitis s/p lap choley (11/12) - Confirmed suspicion for likely acute choley on CT Abd / pelvis w contrast (10/13/15) - Consulted General Surgery, greatly appreciate assistance. 11/12 lap choley. - Continue pain control, anti-emetics - Surgery recs: okay to dc when medically ready; surgery f/u arranged - Alk phos: 248 (11/14) > 199 > 200 > 184 - normal T bili 0.7  # Breakthrough Fever: tmax 102.68F overnight, with mild tachycardia,despite on abx. No other signs or symptoms of infection. - Skin exam unremarkable: erythematous patch of skin in sacral area without any skin breakdown  - repeat CXR 2 view 11/16: limited study by poor inspiration, mild basilar atelectasis, no active disease -  repeat blood and urine cultures: pending  - re-consult ID today will see in PM  - Spoke with Surgery this AM: saw pt this AM; fevers are unlikely secondary to anything surgical; LFTs are essentially normal with the exception of her alk phos, which alone doesn't give indication to a  complication such as a bile leak.   # ESBL E Coli Pyelonephritis w/ suprapubic catheter - Improving / Severe Sepsis - Resolved On admission, refractory to 4 L of fluid with sbp 84 despite low dose levophed. Has been off pressors for most of 11/7. Lactic Acid 11/7: 0.6 and Procalcitonin 0.25. Confirmed ESBL E Coli on urine culture. Suprapubic catheter changed 10/09/15. Clinically improved on current appropriate antibiotic therapy. However had persistent breakthrough fevers, gradual improving, tmax 101.33F overnight, with mild tachycardia.  - Continue Ertapenem 1g IV daily (11/11>11/20) to complete total 14 day appropriate antibiotic course, (off imipenem from 11/7 to 11/10) - Blood culture 11/6: coagulase negative staph in one bottle (likely contaminant); second bottle NGTD x 5 day   # Fecal Impaction: on CT in rectosigmoid colon: Patient denies feeling constipated, however states that she is on Miralax BID at home. S/p enema. Having BMs, per chart x6 on  11/16 - held home miralax  # Normocytic Anemia, presumed chronic IDA Hgb 7.0>7.4 > 6.7 (11/10) > 8.2 >7.8>8.8 >8.2 > 7.5 > 8.6 > 8.5; on admission 9.5. No active bleeding noted clinically. FOBT negative.  - 1 unit pRBC transfused 11/10; hgb stable - anemia studies: Ferritin 58; Fe 9; TIBC 241; saturation ratio 4%; folate 16.1; Vit B12 1076 (H) - IV iron with Feraheme on 11/11, will need repeat dose in approx 3 days per pharm; second dose 11/15  # Transaminitis, likely in setting of severe sepsis and possible shock liver - Resolved  Initially elevated 366/197/229>>> AST 11 / ALT 16 / T bili 0.7  # Hypokalemia: mild  with K of 3.2 >> 3.4 - Kdur 56mq  x 1 11/10; 40 mEq x 1 11/13 - Kdur 29mBID x1 11/17  # Major Depression/Anxiety: - cont home Klonopin  - cont home Seroquel  # GERD: - protonix  # Stage 1 Decubitus Ulcer: erythema of skin but no skin breakdown. No tenderness.  - prophylaxis with dressing per nursing   # Hx PE with hx  of anticoagulation: Per patient, [facility] was told to stop taking anticoagulation until outpatient follow up.  - per chart review in care everywhere, [facility] in the setting of anticoagulation use (xeralto). She was taken to the OR on 09/27/15 for IVC filter placement. At the time of discharge, her Xarelto was held until her follow up appointment in 2 weeks with Dr. EvAmalia Haileynd a plan will need to be made regarding anticoagulation and her IVC filter at this appointment - Additionally per chart review, patient's hemoglobin had been unstable after initiation of systemic anticoagulation per urology - will continue to hold Xeralto  # Cerebral Palsy - Tizanidine and Baclofen   FEN/GI: normal diet; NS 75cc/hr  PPx: SQ Heparin   Disposition:  - continues to have breakthrough fevers despite being on IV antibiotics; pending further evaluation/management of medical issues   - Following CSW to determine SNF on discharge. - denies nausea. Abdominal pain is better - denies cough, sob   Subjective:  - Febrile overnight at 102.9 with associated tachycardia, now afebrile with stable vitals   Objective: Temp:  [97.5 F (36.4 C)-102.9 F (39.4 C)] 97.5 F (36.4 C) (11/17 0535) Pulse Rate:  [61-135] 67 (11/17 0535) Resp:  [16-22] 16 (11/17 0535) BP: (105-143)/(49-92) 122/66 mmHg (11/17 0535) SpO2:  [92 %-97 %] 97 % (11/17 0535) Physical Exam: General: NAD, resting in bed HEENT: Neck contracted to right, left IJ in place (non-tender no erythema);  MMM Cardiovascular: RRR, no m/r/g Respiratory: CTAB bilaterally Abdomen: incisions dry and without erythema; some soreness to palapation in the left lower quadrant and suprapubic region; suprapubic catheter: no surrounding erythema Extremities: warm and dry Back: erythematous patch noted on sacral area  without skin breakdown   Laboratory:  Recent Labs Lab 10/18/15 0602 10/18/15 1041 10/19/15 0555  WBC 4.6 6.2 5.9  HGB 6.6* 8.6* 8.5*  HCT 21.8* 27.9* 27.6*  PLT 232 346 356    Recent Labs Lab 10/18/15 0602 10/18/15 1041 10/19/15 0555  NA 144 138 139  K 2.5* 4.2 3.4*  CL 120* 105 104  CO2 20* 27 29  BUN <5* <5* <5*  CREATININE <0.30* 0.41* 0.37*  CALCIUM 5.4* 8.2* 8.3*  PROT 3.4* 5.5* 5.4*  BILITOT 0.5 0.5 0.7  ALKPHOS 123 200* 184*  ALT 13* 19 16  AST 12* 20 11*  GLUCOSE 75 138* 106*   Anemia Panel - Iron 9, TIBC 241, Sat ratio 4, ferritin 58 Folate 16 Vitamin B-12, 1076  Imaging/Diagnostic Tests: ECHO: EF 65-70%; normal  CXR 11/6: IMPRESSION: Negative for pneumonia.  RUQ Abd [place]/7) IMPRESSION: Cholelithiasis with indeterminate presence of acute cholecystitis given conflicting findings (there is gallbladder distention and mild wall thickening but no focal tenderness). HIDA scan could evaluate cystic duct patency.  CXR 11/7: IMPRESSION: 1. Left IJ central line with tip at the upper right atrium. No pneumothorax. 2. Progressive hypoventilation and perihilar atelectasis.  CT Abd / Pelvis w/ contrast 11/11  IMPRESSION: 1. Significant distension of the gallbladder, gallstones, and new gallbladder wall thickening consistent with acute cholecystitis. 2. Small bilateral pleural effusions, body wall edema. 3. Significant stool burden within the rectosigmoid colon suggests fecal impaction. 4. Suprapubic catheter unremarkable in appearance. Urinary bladder wall is thickened. 5. Smaller collections posterior to the urinary bladder compared prior study.  Smiley Houseman, MD 10/19/2015, 7:17 AM PGY-1, [place] Intern pager: [phone], text pages welcome

## 2015-10-20 LAB — URINE CULTURE

## 2015-10-20 LAB — CBC
HEMATOCRIT: 28.7 % — AB (ref 36.0–46.0)
Hemoglobin: 8.6 g/dL — ABNORMAL LOW (ref 12.0–15.0)
MCH: 27.7 pg (ref 26.0–34.0)
MCHC: 30 g/dL (ref 30.0–36.0)
MCV: 92.3 fL (ref 78.0–100.0)
Platelets: 395 10*3/uL (ref 150–400)
RBC: 3.11 MIL/uL — AB (ref 3.87–5.11)
RDW: 17.5 % — ABNORMAL HIGH (ref 11.5–15.5)
WBC: 6 10*3/uL (ref 4.0–10.5)

## 2015-10-20 LAB — COMPREHENSIVE METABOLIC PANEL
ALT: 14 U/L (ref 14–54)
AST: 14 U/L — AB (ref 15–41)
Albumin: 2.1 g/dL — ABNORMAL LOW (ref 3.5–5.0)
Alkaline Phosphatase: 162 U/L — ABNORMAL HIGH (ref 38–126)
Anion gap: 6 (ref 5–15)
BILIRUBIN TOTAL: 0.6 mg/dL (ref 0.3–1.2)
CALCIUM: 8.3 mg/dL — AB (ref 8.9–10.3)
CO2: 29 mmol/L (ref 22–32)
CREATININE: 0.43 mg/dL — AB (ref 0.44–1.00)
Chloride: 105 mmol/L (ref 101–111)
Glucose, Bld: 105 mg/dL — ABNORMAL HIGH (ref 65–99)
Potassium: 3.7 mmol/L (ref 3.5–5.1)
Sodium: 140 mmol/L (ref 135–145)
TOTAL PROTEIN: 5.7 g/dL — AB (ref 6.5–8.1)

## 2015-10-20 MED ORDER — PROCHLORPERAZINE EDISYLATE 5 MG/ML IJ SOLN
10.0000 mg | Freq: Once | INTRAMUSCULAR | Status: AC
  Administered 2015-10-20: 10 mg via INTRAVENOUS
  Filled 2015-10-20: qty 2

## 2015-10-20 NOTE — Progress Notes (Signed)
. Family Medicine Teaching Service Daily Progress Note Intern Pager: [phone]  Patient name: [name] [name] Medical record number: [id number] Date of birth: September 21, 1976 Age: 39 y.o. Gender: female  Primary Care Provider: Luiz Blare, DO Consultants: ID, Surgery Code Status: FULL  Pt Overview and Major Events to Date:  11/6-11/7: Admitted for urosepsis; refractory to 4 litres of fluid with sbp 84 despite low dose levophed. Has been off pressors for most of 11/7. [place] cath changed as well. Started on Vanc and Imipenem  11/8: Transferred to floor from [place] 11/9: discontinued Vanc due to urine cultures showing ESBL and no growth with blood cultures  11/10: hgb 6.7; no active bleeding; transfuse 1 unit pRBC  11/11: Abx switched to Ertapenem; MRCP (unable to be obtain due to baclofen pump) 11/12: Surgery consulted, on CT abd/pelvis dx concern acute choley > to OR today for lap choley 11/17: Ertapenem stopped  Assessment and Plan:  # Acute Choleycstitis s/p lap choley (11/12) - Confirmed suspicion for likely acute choley on CT Abd / pelvis w contrast (10/13/15) - Consulted General Surgery, greatly appreciate assistance. 11/12 lap choley. - Continue pain control, anti-emetics - Surgery recs: okay to dc when medically ready; surgery f/u arranged - Alk phos: 248 (11/14) > 199 > 200 > 184 > 162 - normal T bili 0.7 > 0.6  # Breakthrough Fever: tmax 102.61F overnight. Tachycardia resolved. - Skin exam unremarkable: erythematous patch of skin in sacral area without any skin breakdown  - repeat CXR 2 view 11/16: limited study by poor inspiration, mild basilar atelectasis, no active disease -  repeat blood cultures: pending. Culture from IJ drawn 11/18.   - ID recommended stopping ertapenem and observing for fever. If spikes fever after 48 hours since medication stopped, re-collect blood culture. - Per Surgery 11/17: fevers are unlikely secondary to anything surgical; LFTs are essentially  normal with the exception of her alk phos, which alone doesn't give indication to a complication such as a bile leak.   # ESBL E Coli Pyelonephritis w/ suprapubic catheter - Improving / Severe Sepsis - Resolved On admission, refractory to 4 L of fluid with sbp 84 despite low dose levophed. Has been off pressors for most of 11/7. Lactic Acid 11/7: 0.6 and Procalcitonin 0.25. Confirmed ESBL E Coli on urine culture. Suprapubic catheter changed 10/09/15. Clinically improved on current appropriate antibiotic therapy. However had persistent breakthrough fevers. - Received 7 days of IV ertapenem.  - Blood culture 11/6: coagulase negative staph in one bottle (likely contaminant); second bottle NGTD x 5 day   # Fecal Impaction: on CT in rectosigmoid colon: Patient denies feeling constipated, however states that she is on Miralax BID at home. S/p enema. (Resolved)  # Normocytic Anemia, presumed chronic IDA Hgb 7.0>7.4 > 6.7 (11/10) > 8.2 >7.8>8.8 >8.2 > 7.5 > 8.6 > 8.5 > 8.6; on admission 9.5. No active bleeding noted clinically. FOBT negative.  - Daily CBCs - 1 unit pRBC transfused 11/10; hgb stable - anemia studies: Ferritin 58; Fe 9; TIBC 241; saturation ratio 4%; folate 16.1; Vit B12 1076 (H) - IV iron with Feraheme on 11/11, will need repeat dose in approx 3 days per pharm; second dose 11/15  # Transaminitis, likely in setting of severe sepsis and possible shock liver - Resolved  Initially elevated 366/197/229>>> AST 11 / ALT 16 / T bili 0.7  # Hypokalemia: Resolved - Daily BMETs  # Major Depression/Anxiety: - cont home Klonopin  - cont home Seroquel  # GERD: - protonix  #  Stage 1 Decubitus Ulcer: erythema of skin but no skin breakdown. No tenderness.  - prophylaxis with dressing per nursing   # Hx PE with hx of anticoagulation: Per patient, had recent vaginal bleeding which was evaluated at [facility] this month; was told to stop taking anticoagulation until outpatient follow up.   - per chart review in care everywhere, patient was admitted to [facility] for vaginal bleeding and acute symptomatic anemia in Oct 2016 in the setting of anticoagulation use (xeralto). She was taken to the OR on 09/27/15 for IVC filter placement. At the time of discharge, her Xarelto was held until her follow up appointment in 2 weeks with Dr. Amalia Hailey and a plan will need to be made regarding anticoagulation and her IVC filter at this appointment - Additionally per chart review, patient's hemoglobin had been unstable after initiation of systemic anticoagulation per urology - will continue to hold Xeralto  # Cerebral Palsy - Tizanidine and Baclofen   FEN/GI: normal diet; NS 75cc/hr  PPx: SQ Heparin   Disposition:  - continues to have breakthrough fevers despite being on IV antibiotics; continue to monitor for at least 48 hours after stopping ertapenem ( - Following CSW to determine SNF on discharge. Patient states this will be a new facility and that she would like to be discharged on a weekday when administrator will be there.  - denies nausea. Abdominal pain is better - denies cough, sob   Subjective:  Patient says abdominal pain has been improving. No complaints of chest pain. No nausea this morning.   Objective: Temp:  [97.7 F (36.5 C)-102.3 F (39.1 C)] 98.6 F (37 C) (11/18 0836) Pulse Rate:  [70-120] 77 (11/18 0836) Resp:  [18-20] 20 (11/18 0836) BP: (112-168)/(56-79) 112/59 mmHg (11/18 0836) SpO2:  [96 %-99 %] 96 % (11/18 0836) Weight:  [130 lb 14.4 oz (59.376 kg)] 130 lb 14.4 oz (59.376 kg) (11/17 2008) Physical Exam: General: NAD, resting in bed HEENT: Neck contracted to right, left IJ in place (non-tender no erythema);  MMM Cardiovascular: RRR, no m/r/g Respiratory: CTAB bilaterally Abdomen: incisions dry and without erythema; some soreness to palapation in RUQ Extremities: warm and dry  Laboratory:  Recent Labs Lab 10/18/15 1041 10/19/15 0555 10/20/15 0520  WBC  6.2 5.9 6.0  HGB 8.6* 8.5* 8.6*  HCT 27.9* 27.6* 28.7*  PLT 346 356 395    Recent Labs Lab 10/18/15 1041 10/19/15 0555 10/20/15 0520  NA 138 139 140  K 4.2 3.4* 3.7  CL 105 104 105  CO2 '27 29 29  ' BUN <5* <5* <5*  CREATININE 0.41* 0.37* 0.43*  CALCIUM 8.2* 8.3* 8.3*  PROT 5.5* 5.4* 5.7*  BILITOT 0.5 0.7 0.6  ALKPHOS 200* 184* 162*  ALT '19 16 14  ' AST 20 11* 14*  GLUCOSE 138* 106* 105*   Anemia Panel - Iron 9, TIBC 241, Sat ratio 4, ferritin 58 Folate 16 Vitamin B-12, 1076  Imaging/Diagnostic Tests: ECHO: EF 65-70%; normal  CXR 11/6: IMPRESSION: Negative for pneumonia.  RUQ Abd [place] (11/7) IMPRESSION: Cholelithiasis with indeterminate presence of acute cholecystitis given conflicting findings (there is gallbladder distention and mild wall thickening but no focal tenderness). HIDA scan could evaluate cystic duct patency.  CXR 11/7: IMPRESSION: 1. Left IJ central line with tip at the upper right atrium. No pneumothorax. 2. Progressive hypoventilation and perihilar atelectasis.  CT Abd / Pelvis w/ contrast 11/11 IMPRESSION: 1. Significant distension of the gallbladder, gallstones, and new gallbladder wall thickening consistent with acute cholecystitis. 2.  Small bilateral pleural effusions, body wall edema. 3. Significant stool burden within the rectosigmoid colon suggests fecal impaction. 4. Suprapubic catheter unremarkable in appearance. Urinary bladder wall is thickened. 5. Smaller collections posterior to the urinary bladder compared prior study.  Rogue Bussing, MD 10/20/2015, 9:02 AM PGY-1, [place] Intern pager: [phone], text pages welcome

## 2015-10-20 NOTE — Care Management Important Message (Signed)
Important Message  Patient Details  Name: [name] SlickerJudith [name] MRN: [id number] Date of Birth: [birth year]   Medicare Important Message Given:  Yes    Jheri Mitter, Annamarie MajorCheryl U, RN 10/20/2015, 12:53 PM

## 2015-10-20 NOTE — Clinical Social Work Note (Signed)
Patient came to hospital [facility], however they are unable to take patient back as they are at capacity and have no available beds.  Patient agreeable to another facility and [facility] agreed to [facility] patient when stable.    CSW later informed by patient and her friend that Ms. Kassab would like [facility] as she has been there before, as well as her friend.  Contact [facility] with [facility] director Larita FifeLynn today. They declined patient when her information was initially sent out,  however she requested that patient's information be resent to them for review and information transmitted through the HUB this afternoon.    CSW will continue to follow and facilitate discharge to a skilled facility when medically stable.   Genelle BalVanessa Librado Guandique, MSW, LCSW Licensed Clinical Social Worker Clinical Social Work Department [facility] [phone]

## 2015-10-20 NOTE — Discharge Summary (Signed)
[facility] Discharge Summary  Patient name: [name] [name] Medical record number: [id number] Date of birth: 1976/06/10 Age: 39 y.o. Gender: female Date of Admission: 10/08/2015  Date of Discharge: 10/30/15 Admitting Physician: Brand Males, MD  Primary Care Provider: Luiz Blare, DO Consultants: Surgery, ID   Indication for Hospitalization: Sepsis with ESBL UTI, Acute Cholecystitis  Discharge Diagnoses/Problem List:  ESBL UTI with sepsis Acute Cholecystitis  Normocytic Anemia, presumed chronic IDA Transaminitis Hypokalemia Hx PE with hx of anticoagulation Cerebral Palsy  Disposition: SNF  Discharge Condition: improved  Discharge Exam:  General: resting in bed, comfortable, cooperative, NAD HEENT: Neck contracted to right, gauze over site of IJ removal, clear dry intact; MMM Cardiovascular: RRR, no m/r/g  Respiratory: CTAB bilaterally  Abdomen: soft, non-tender, improved without significant tenderness in RUQ, non-distended. Lap Incisions dry and without erythema, Pump palpated in RLQ. Extremities: warm and dry, calves non-tender  Brief Hospital Course:  ESBL E Coli Pyelonephritis with Suprapubic Catheter: Patient with a history of prior ESBL UTI was initially admitted to the ICU due to hypotension refractory to IVF; she initially required levophed for pressure support. Patient spent about 24 hours in the ICU, was weaned off pressors then eventually transferred to the floor.  Lactic acid was within normal limits, Procalcitonin was 0.25. Urine culture confirmed ESBL. Patient was started on IV Vancomycing and Imipenem then transitioned to IV Ertapenem (due to less frequent dosing per day and urine culture results. Patinet's suprapubic catheter was changed on 11/07. Patient clinically improved on appropriate antibiotic therapy however continued to spike high fevers. ID was consulted who recommended for total 14 day course of antibiotics.  Blood  cultures: one bottle grew coagulase negative staph (thought to be skin contaminant) and second bottle showed no growth x 5 days. Three other sets of blood cultures and one from her IJ were collected which showed no growth. Patient completed antibiotic course during hospitalization. Later in the hospital course, patient was found to have funguria (>100,000 yeast in urine culture), patient was treated with Fluconazole until her suprapubic catheter was replaced per ID recommendations.   Acute Cholecystitis s/p Laparoscopic Cholecystectomy:  Patient was found to have acute cholecystitis, thought to be a factor in breakthrough fevers despite IV antibiotics noted above. Ultrasound of Upper Quadrant of Abdomen done while in the ICU showed Cholelithiasis with indeterminate presence of acute cholecystitis given conflicting findings (there is gallbladder distention and mild wall thickening but no focal tenderness). When patient became symptomatic CT abdomen and pelvis was done (unable to do HIDA scan as patient has baclofen pump).  CT showed signs of acute cholecystitis. T bili was normal, however alk phos was elevated. Surgery was consulted and patient had surgery on 11/12. Surgery followed patient until day of discharge and felt that patient was surgically stable.   Acute C. Diff Infection:  Thought to be due to long course of antibiotic treatment for ESBL UTI. Patient was started on PO Vancomycin 556m QID for 14 days. Patient's stool output improved with treatment. Patient's fever spikes resolved, and she was afebrile for 72 hours before discharge. Patient was discharged with PO vancomycin to complete therapy (11/22-12/5). Patient's Miralax, Colace, Senokot were held due to diarrhea with C.Diff.   Nausea:  Patient noted of nausea mainly with eating. Symptoms were controlled with Phenergan. Patient was discharged with PRN Phenergan.  Hypotension:  Patient was noted to have soft/low blood pressures after her  resolution of ESBL UTI. Patient was asymptomatic and was responsive to IVF boluses.  Normocytic Anemia, presumed chronic IDA: Patient was trasfused with 1 unit pRBC on 11/10 for hemoglobin of 6.7. Hemoglobin was stable after transfusion. Anemia studies showed iron deficiency anemia. IV feraheme was given x 2 (per pharmacy recommendations)  Transaminitis: Transaminitis was thought to be due to severe sepsis and possible shock liver. Resolved over hospital course.   Hypokalemia: Mild hypokalemia of 3.2 which was repleted.   Hx PE with hx of anticoagulation:  Per patient, had recent vaginal bleeding which was evaluated at [facility] this month; was told to stop taking anticoagulation until outpatient follow up. Per chart review in care everywhere, patient was admitted to [facility] for vaginal bleeding and acute symptomatic anemia in Oct 2016 in the setting of anticoagulation use (xeralto). She was taken to the OR on 09/27/15 for IVC filter placement. At the time of discharge, her Xarelto was held until her follow up appointment in 2 weeks with Dr. Amalia Hailey and a plan will need to be made regarding anticoagulation and her IVC filter at this appointment. Continued to hold Xeralto during hospitalization.   Issues for Follow Up:  - ensure patient has follow up with Dr. Amalia Hailey (urology) to discuss if she will need to restart Xarelto - patient has a follow up appointment with Surgery  - Patient's home Miralax, Colace, Senokot were held due to diarrhea with C.Diff. Consider restarting after patient's infection has resolved - ensure patient has completed therapy for C.Diff (14 days: 11/22-12/5)  Significant Procedures:  - Lap Choley 11/12   Significant Labs and Imaging:   Recent Labs Lab 10/27/15 0345 10/28/15 0615 10/29/15 0326  WBC 6.3 6.2 6.3  HGB 9.5* 9.5* 10.5*  HCT 31.9* 31.6* 34.1*  PLT 387 389 342    Recent Labs Lab 10/24/15 0545  10/24/15 0554 10/25/15 0520 10/27/15 0345  10/28/15 0615 10/29/15 0326  NA  --   --  140 140 138 137 140  K  --   < > 2.9* 3.9 3.4* 4.5 3.7  CL  --   --  105 107 105 102 104  CO2  --   --  _0 GLUCOSE  --   --  121* 99 108* 117* 100*  BUN  --   --  8 <5* <5* <5* <5*  CREATININE  --   --  0.58 0.38* 0.44 0.42* 0.46  CALCIUM  --   --  8.2* 8.6* 8.4* 8.7* 8.8*  MG 2.0  --   --   --   --   --   --   < > = values in this interval not displayed.  Anemia Studies: Ferritin 58; Fe 9; TIBC 241; saturation ratio 4%; folate 16.1; Vit B12 1076 (H)  ECHO: EF 65-70%; normal  US Abdomen RUQ: Cholelithiasis with indeterminate presence of acute cholecystitis given conflicting findings (there is gallbladder distention and mild wall thickening but no focal tenderness). HIDA scan could evaluate cystic duct patency.  CT Abdomen and Pelvis: IMPRESSION: 1. Significant distension of the gallbladder, gallstones, and new gallbladder wall thickening consistent with acute cholecystitis. 2. Small bilateral pleural effusions, body wall edema. 3. Significant stool burden within the rectosigmoid colon suggests fecal impaction. 4. Suprapubic catheter unremarkable in appearance. Urinary bladder wall is thickened. 5. Smaller collections posterior to the urinary bladder compared prior study.  CXR: IMPRESSION: Limited study by poor inspiration. Cardiomediastinal silhouette is stable. Mild basilar atelectasis. No active disease.  CT Abdomen and Pelvis: 11/25 IMPRESSION: Limited study due to suboptimal contrast  opacification.  Mild subsegmental atelectasis in both lungs.  Postoperative fluid collection gallbladder fossa of unknown sterility.  Suprapubic catheter in the bladder with diffuse bladder wall thickening. 4 x 1.5 cm fluid collection posterior to the bladder similar to prior study.   Results/Tests Pending at Time of Discharge: none  Discharge Medications:    Medication List    STOP taking these medications         docusate sodium 100 MG capsule  Commonly known as:  COLACE     HYDROcodone-acetaminophen 5-325 MG tablet  Commonly known as:  NORCO/VICODIN     polyethylene glycol powder powder  Commonly known as:  GLYCOLAX/MIRALAX     senna-docusate 8.6-50 MG tablet  Commonly known as:  Senokot-S      TAKE these medications        acetaminophen 325 MG tablet  Commonly known as:  TYLENOL  Take 650 mg by mouth every 6 (six) hours as needed for moderate pain.     ARIPiprazole 2 MG tablet  Commonly known as:  ABILIFY  Take 2 mg by mouth at bedtime.     bacitracin-polymyxin b ointment  Commonly known as:  POLYSPORIN  Apply 1 g topically 2 (two) times daily. Apply around Suprapubic ostomy site     baclofen 20 MG tablet  Commonly known as:  LIORESAL  Take 20 mg by mouth 4 (four) times daily. For bladder spasms     bisacodyl 10 MG suppository  Commonly known as:  DULCOLAX  Place 10 mg rectally daily as needed for moderate constipation.     clonazePAM 1 MG tablet  Commonly known as:  KLONOPIN  Take 1 tablet (1 mg total) by mouth 4 (four) times daily.     feeding supplement (ENSURE ENLIVE) Liqd  Take 237 mLs by mouth 2 (two) times daily between meals.     flavoxATE 100 MG tablet  Commonly known as:  URISPAS  Take 100 mg by mouth 3 (three) times daily as needed for bladder spasms.     FLUoxetine 10 MG capsule  Commonly known as:  PROZAC  Take 10 mg by mouth daily.     ibuprofen 200 MG tablet  Commonly known as:  ADVIL,MOTRIN  Take 400 mg by mouth every 8 (eight) hours as needed for moderate pain.     magnesium hydroxide 400 MG/5ML suspension  Commonly known as:  MILK OF MAGNESIA  Take 30 mLs by mouth daily as needed for mild constipation.     MOISTURE BARRIER EX  Apply 1 application topically as needed (for genitourinary).     multivitamin tablet  Take 1 tablet by mouth daily.     oxyCODONE 5 MG immediate release tablet  Commonly known as:  Oxy IR/ROXICODONE  Take 5 mg by  mouth every 4 (four) hours as needed for moderate pain or severe pain (may give 2 tablets every 4 hours for severe pain).     promethazine 25 MG tablet  Commonly known as:  PHENERGAN  Take 1 tablet (25 mg total) by mouth 3 (three) times daily with meals as needed for nausea or vomiting.     QUEtiapine 100 MG tablet  Commonly known as:  SEROQUEL  Take 1 tablet (100 mg total) by mouth at bedtime.     RA SALINE ENEMA 19-7 GM/118ML Enem  Place 1 application rectally once.     tiZANidine 4 MG capsule  Commonly known as:  ZANAFLEX  Take 4 mg by mouth 2 (two) times daily at  8 am and 10 pm.     vancomycin 50 mg/mL oral solution  Commonly known as:  VANCOCIN  Take 2.5 mLs (125 mg total) by mouth every 6 (six) hours.        Discharge Instructions: Please refer to Patient Instructions section of EMR for full details.  Patient was counseled important signs and symptoms that should prompt return to medical care, changes in medications, dietary instructions, activity restrictions, and follow up appointments.   Follow-Up Appointments: Follow-up Information    Follow up with LIEPINS, ANDY, PA-C On 11/01/2015.   Specialty:  Surgery   Why:  [facility] Surgery, 9:15am, arrive no later than 8:45am for paperwork   Contact information:   [address] [name] [address] [phone]       Follow up with Tawanna Sat, MD On 11/07/2015.   Specialty:  Family Medicine   Why:  at 1:45PM for hospital follow up    Contact information:   [place] [address] [phone]       Please follow up.   Why:  Please follow up with Dr. Amalia Hailey your urologist to discuss if you need to restart Elam Dutch, MD 10/30/2015, 3:14 PM PGY-1, [place]

## 2015-10-21 DIAGNOSIS — R10819 Abdominal tenderness, unspecified site: Secondary | ICD-10-CM | POA: Insufficient documentation

## 2015-10-21 DIAGNOSIS — B49 Unspecified mycosis: Secondary | ICD-10-CM | POA: Insufficient documentation

## 2015-10-21 DIAGNOSIS — R509 Fever, unspecified: Secondary | ICD-10-CM

## 2015-10-21 DIAGNOSIS — T83518A Infection and inflammatory reaction due to other urinary catheter, initial encounter: Principal | ICD-10-CM

## 2015-10-21 LAB — BASIC METABOLIC PANEL
ANION GAP: 8 (ref 5–15)
BUN: <5 mg/dL — ABNORMAL LOW (ref 6–20)
CHLORIDE: 105 mmol/L (ref 101–111)
CO2: 28 mmol/L (ref 22–32)
Calcium: 8.3 mg/dL — ABNORMAL LOW (ref 8.9–10.3)
Creatinine, Ser: 0.43 mg/dL — ABNORMAL LOW (ref 0.44–1.00)
GFR calc non Af Amer: >60 mL/min (ref >60)
GLUCOSE: 114 mg/dL — AB (ref 65–99)
Potassium: 3.4 mmol/L — ABNORMAL LOW (ref 3.5–5.1)
Sodium: 141 mmol/L (ref 135–145)

## 2015-10-21 LAB — CBC
HEMATOCRIT: 29.4 % — AB (ref 36.0–46.0)
HEMOGLOBIN: 8.9 g/dL — AB (ref 12.0–15.0)
MCH: 28 pg (ref 26.0–34.0)
MCHC: 30.3 g/dL (ref 30.0–36.0)
MCV: 92.5 fL (ref 78.0–100.0)
Platelets: 429 10*3/uL — ABNORMAL HIGH (ref 150–400)
RBC: 3.18 MIL/uL — AB (ref 3.87–5.11)
RDW: 17.8 % — ABNORMAL HIGH (ref 11.5–15.5)
WBC: 7.7 10*3/uL (ref 4.0–10.5)

## 2015-10-21 MED ORDER — POTASSIUM CHLORIDE CRYS ER 20 MEQ PO TBCR
40.0000 meq | EXTENDED_RELEASE_TABLET | Freq: Once | ORAL | Status: AC
  Administered 2015-10-21: 40 meq via ORAL
  Filled 2015-10-21: qty 2

## 2015-10-21 MED ORDER — FLUCONAZOLE IN SODIUM CHLORIDE 200-0.9 MG/100ML-% IV SOLN
200.0000 mg | INTRAVENOUS | Status: AC
  Administered 2015-10-21 – 2015-10-22 (×2): 200 mg via INTRAVENOUS
  Filled 2015-10-21 (×2): qty 100

## 2015-10-21 NOTE — Progress Notes (Signed)
Family Medicine Teaching Service Daily Progress Note Intern Pager: [phone]  Patient name: [name] [name] Medical record number: [id number] Date of birth: [birth year] Age: 39 y.o. Gender: female  Primary Care Provider: Luiz Blare, DO Consultants: ID, Surgery Code Status: FULL  Pt Overview and Major Events to Date:  11/6-11/7: Admitted for urosepsis; refractory to 4 litres of fluid with sbp 84 despite low dose levophed. Has been off pressors for most of 11/7. [place] cath changed as well. Started on Vanc and Imipenem  11/8: Transferred to floor from [facility] 11/9: discontinued Vanc due to urine cultures showing ESBL and no growth with blood cultures  11/10: hgb 6.7; no active bleeding; transfuse 1 unit pRBC  11/11: Abx switched to Ertapenem; MRCP (unable to be obtain due to baclofen pump) 11/12: Surgery consulted, on CT abd/pelvis dx concern acute choley > to OR today for lap choley 11/17: Ertapenem stopped  Assessment and Plan:  # Acute Choleycstitis s/p lap choley (11/12) - Confirmed suspicion for likely acute choley on CT Abd / pelvis w contrast (10/13/15) - Continue pain control, anti-emetics - Surgery recs: okay to dc when medically ready; surgery f/u arranged - Alk phos: 248 (11/14) > 199 > 200 > 184 > 162 - normal T bili 0.7 > 0.6  # Breakthrough Fever: Possibly related to a drug fever per ID recommendations. No obvious source of infection noted from CXR, exam. She has been stable s/p perc drain - IJ culture drawn 11/18 -  repeat blood cultures: pending. Culture from IJ drawn 11/18.  - repeat blood culture if spiking fever 48hrs s/p antibiotics withdrawal.  # ESBL E Coli Pyelonephritis w/ suprapubic catheter - Improving / Severe Sepsis - Resolved. Suprapubic catheter changed 10/09/15 - Received 7 days of IV ertapenem.  - Blood culture 11/6: coagulase negative staph in one bottle (likely contaminant); second bottle NGTD x 5 day  # Fecal Impaction: on CT in rectosigmoid colon:  Patient denies feeling constipated, however states that she is on Miralax BID at home. S/p enema. (Resolved)  # Normocytic Anemia, presumed chronic IDA Hgb 7.0>7.4 > 6.7 (11/10) > 8.2 >7.8>8.8 >8.2 > 7.5 > 8.6 > 8.5 > 8.6; on admission 9.5. No active bleeding noted clinically. FOBT negative.  - Daily CBCs - 1 unit pRBC transfused 11/10; hgb stable - anemia studies: Ferritin 58; Fe 9; TIBC 241; saturation ratio 4%; folate 16.1; Vit B12 1076 (H) - IV iron with Feraheme on 11/11, will need repeat dose in approx 3 days per pharm; second dose 11/15  # Transaminitis, likely in setting of severe sepsis and possible shock liver - Resolved   # Hypokalemia - Daily BMETs - replete as needed  # Major Depression/Anxiety: - cont home Klonopin  - cont home Seroquel  # GERD: - protonix  # Stage 1 Decubitus Ulcer: erythema of skin but no skin breakdown. No tenderness.  - prophylaxis with dressing per nursing   # Hx PE with hx of anticoagulation: Per patient, had recent vaginal bleeding which was evaluated at [facility] this month; was told to stop taking anticoagulation until outpatient follow up.  - per chart review in care everywhere, patient was admitted to [facility] for vaginal bleeding and acute symptomatic anemia in Oct 2016 in the setting of anticoagulation use (xeralto). She was taken to the OR on 09/27/15 for IVC filter placement. At the time of discharge, her Xarelto was held until her follow up appointment in 2 weeks with Dr. Amalia Hailey and a plan will need to be made  regarding anticoagulation and her IVC filter at this appointment - Additionally per chart review, patient's hemoglobin had been unstable after initiation of systemic anticoagulation per urology - will continue to hold Xeralto  # Cerebral Palsy - Tizanidine and Baclofen   FEN/GI: normal diet; NS 75cc/hr  PPx: SQ Heparin   Disposition: pending resolution of fevers and follow-up of blood cultures. Will be discharged to  SNF  Subjective:  No complaints today. No chest pain or abdominal pain. She has not had any nausea today. Upon chart review, patient had tmax of 100.8 degrees farenheit with tachycardia to 122.  Objective: Temp:  [97.6 F (36.4 C)-100.8 F (38.2 C)] 97.6 F (36.4 C) (11/19 0556) Pulse Rate:  [63-122] 63 (11/19 0556) Resp:  [17-20] 17 (11/19 0556) BP: (112-127)/(53-69) 124/69 mmHg (11/19 0556) SpO2:  [93 %-98 %] 98 % (11/19 0556) Weight:  [123 lb 10.9 oz (56.1 kg)-124 lb (56.246 kg)] 124 lb (56.246 kg) (11/18 2015)   Physical Exam: General: NAD, resting in bed HEENT: Neck contracted to right, left IJ in place (non-tender no erythema);  MMM Cardiovascular: RRR, no m/r/g Respiratory: CTAB bilaterally Abdomen: incisions dry and without erythema; soft, non-tender, non-distended. Pump palpated in RLQ Extremities: warm and dry  Laboratory:  Recent Labs Lab 10/19/15 0555 10/20/15 0520 10/21/15 0500  WBC 5.9 6.0 7.7  HGB 8.5* 8.6* 8.9*  HCT 27.6* 28.7* 29.4*  PLT 356 395 429*    Recent Labs Lab 10/18/15 1041 10/19/15 0555 10/20/15 0520 10/21/15 0500  NA 138 139 140 141  K 4.2 3.4* 3.7 3.4*  CL 105 104 105 105  CO2 _0 BUN <5* <5* <5* <5*  CREATININE 0.41* 0.37* 0.43* 0.43*  CALCIUM 8.2* 8.3* 8.3* 8.3*  PROT 5.5* 5.4* 5.7*  --   BILITOT 0.5 0.7 0.6  --   ALKPHOS 200* 184* 162*  --   ALT _1 --   AST 20 11* 14*  --   GLUCOSE 138* 106* 105* 114*   Anemia Panel - Iron 9, TIBC 241, Sat ratio 4, ferritin 58 Folate 16 Vitamin B-12, 1076  Imaging/Diagnostic Tests: ECHO: EF 65-70%; normal  CXR 11/6: IMPRESSION: Negative for pneumonia.  RUQ Abd [place] (11/7) IMPRESSION: Cholelithiasis with indeterminate presence of acute cholecystitis given conflicting findings (there is gallbladder distention and mild wall thickening but no focal tenderness). HIDA scan could evaluate cystic duct patency.  CXR 11/7: IMPRESSION: 1. Left IJ central line with tip at  the upper right atrium. No pneumothorax. 2. Progressive hypoventilation and perihilar atelectasis.  CT Abd / Pelvis w/ contrast 11/11 IMPRESSION: 1. Significant distension of the gallbladder, gallstones, and new gallbladder wall thickening consistent with acute cholecystitis. 2. Small bilateral pleural effusions, body wall edema. 3. Significant stool burden within the rectosigmoid colon suggests fecal impaction. 4. Suprapubic catheter unremarkable in appearance. Urinary bladder wall is thickened. 5. Smaller collections posterior to the urinary bladder compared prior study.  CXR 11/16 IMPRESSION: Limited study by poor inspiration. Cardiomediastinal silhouette is stable. Mild basilar atelectasis. No active disease.  Mariel Aloe, MD 10/21/2015, 8:13 AM PGY-3, [place] Intern pager: [phone], text pages welcome

## 2015-10-21 NOTE — Progress Notes (Signed)
[facility]    Subjective: C/o temp to above 100 last night   Antibiotics:  Anti-infectives    Start     Dose/Rate Route Frequency Ordered Stop   10/13/15 0600  ertapenem (INVANZ) 1 g in sodium chloride 0.9 % 50 mL IVPB  Status:  Discontinued     1 g 100 mL/hr over 30 Minutes Intravenous Every 24 hours 10/12/15 1519 10/19/15 1748   10/09/15 2000  vancomycin (VANCOCIN) IVPB 750 mg/150 ml premix  Status:  Discontinued     750 mg 150 mL/hr over 60 Minutes Intravenous Every 12 hours 10/09/15 1134 10/11/15 1427   10/09/15 1200  imipenem-cilastatin (PRIMAXIN) 250 mg in sodium chloride 0.9 % 100 mL IVPB     250 mg 200 mL/hr over 30 Minutes Intravenous 4 times per day 10/09/15 1134 10/13/15 0553   10/09/15 1100  vancomycin (VANCOCIN) 500 mg in sodium chloride 0.9 % 100 mL IVPB  Status:  Discontinued     500 mg 100 mL/hr over 60 Minutes Intravenous Every 8 hours 10/09/15 0229 10/09/15 0429   10/09/15 1000  piperacillin-tazobactam (ZOSYN) IVPB 3.375 g  Status:  Discontinued     3.375 g 12.5 mL/hr over 240 Minutes Intravenous Every 8 hours 10/09/15 0229 10/09/15 0428   10/09/15 0800  piperacillin-tazobactam (ZOSYN) IVPB 3.375 g  Status:  Discontinued     3.375 g 12.5 mL/hr over 240 Minutes Intravenous Every 8 hours 10/09/15 0428 10/09/15 0920   10/09/15 0800  vancomycin (VANCOCIN) 500 mg in sodium chloride 0.9 % 100 mL IVPB  Status:  Discontinued     500 mg 100 mL/hr over 60 Minutes Intravenous Every 8 hours 10/09/15 0429 10/09/15 1134   10/09/15 0230  piperacillin-tazobactam (ZOSYN) IVPB 3.375 g  Status:  Discontinued     3.375 g 12.5 mL/hr over 240 Minutes Intravenous Every 8 hours 10/09/15 0221 10/09/15 0428   10/09/15 0230  vancomycin (VANCOCIN) IVPB 1000 mg/200 mL premix  Status:  Discontinued     1,000 mg 200 mL/hr over 60 Minutes Intravenous  Once 10/09/15 0221 10/09/15 0427   10/09/15 0045  piperacillin-tazobactam (ZOSYN) IVPB 3.375  g     3.375 g 100 mL/hr over 30 Minutes Intravenous  Once 10/09/15 0032 10/09/15 0217   10/09/15 0045  vancomycin (VANCOCIN) IVPB 1000 mg/200 mL premix     1,000 mg 200 mL/hr over 60 Minutes Intravenous  Once 10/09/15 0032 10/09/15 0243   10/09/15 0030  cefTRIAXone (ROCEPHIN) 1 g in dextrose 5 % 50 mL IVPB     1 g 100 mL/hr over 30 Minutes Intravenous  Once 10/09/15 0018 10/09/15 0107      Medications: Scheduled Meds: . baclofen  20 mg Oral QID  . clonazePAM  0.5 mg Oral 3 times per day  . heparin subcutaneous  5,000 Units Subcutaneous 3 times per day  . pantoprazole  40 mg Oral QHS  . QUEtiapine  100 mg Oral QHS  . tiZANidine  4 mg Oral 2 times per day   Continuous Infusions: . lactated ringers 10 mL/hr at 10/14/15 1247   PRN Meds:.acetaminophen, alteplase, diphenhydrAMINE, oxyCODONE, promethazine **OR** promethazine **OR** promethazine, sodium chloride    Objective: Weight change: -7 lb 3.6 oz (-3.276 kg)  Intake/Output Summary (Last 24 hours) at 10/21/15 1221 Last data filed at 10/21/15 0855  Gross per 24 hour  Intake   1080 ml  Output   1800 ml  Net   -  720 ml   Blood pressure 120/55, pulse 72, temperature 97.6 F (36.4 C), temperature source Oral, resp. rate 18, height 5' (1.524 m), weight 124 lb (56.246 kg), last menstrual period 04/05/2011, SpO2 98 %. Temp:  [97.6 F (36.4 C)-100.8 F (38.2 C)] 97.6 F (36.4 C) (11/19 0856) Pulse Rate:  [63-122] 72 (11/19 0856) Resp:  [17-20] 18 (11/19 0856) BP: (120-127)/(53-69) 120/55 mmHg (11/19 0856) SpO2:  [93 %-98 %] 98 % (11/19 0856) Weight:  [124 lb (56.246 kg)] 124 lb (56.246 kg) (11/18 2015)  Physical Exam: General: Alert and awake, oriented x3, not in any acute distress. HEENT: anicteric sclera, pupils reactive to light and accommodation, EOMI CVS regular rate, normal r,  no murmur rubs or gallops Chest: clear to auscultation bilaterally, no wheezing, rales or rhonchi Abdomen: soft nontender,normal bowel  sounds, Extremities: contractures Skin: no rashes  Neuro: flaccid LE with spasticity  CBC: CBC Latest Ref Rng 10/21/2015 10/20/2015 10/19/2015  WBC 4.0 - 10.5 K/uL 7.7 6.0 5.9  Hemoglobin 12.0 - 15.0 g/dL 0.9(W8.9(L) 1.1(B8.6(L) 1.4(N8.5(L)  Hematocrit 36.0 - 46.0 % 29.4(L) 28.7(L) 27.6(L)  Platelets 150 - 400 K/uL 429(H) 395 356       BMET  Recent Labs  10/20/15 0520 10/21/15 0500  NA 140 141  K 3.7 3.4*  CL 105 105  CO2 29 28  GLUCOSE 105* 114*  BUN <5* <5*  CREATININE 0.43* 0.43*  CALCIUM 8.3* 8.3*     Liver Panel   Recent Labs  10/19/15 0555 10/20/15 0520  PROT 5.4* 5.7*  ALBUMIN 2.2* 2.1*  AST 11* 14*  ALT 16 14  ALKPHOS 184* 162*  BILITOT 0.7 0.6       Sedimentation Rate No results for input(s): ESRSEDRATE in the last 72 hours. C-Reactive Protein No results for input(s): CRP in the last 72 hours.  Micro Results: Recent Results (from the past 720 hour(s))  Urine culture     Status: None   Collection Time: 09/24/15  3:18 PM  Result Value Ref Range Status   Specimen Description URINE, RANDOM  Final   Special Requests NONE  Final   Culture   Final    >=100,000 COLONIES/mL ESCHERICHIA COLI Confirmed Extended Spectrum Beta-Lactamase Producer (ESBL)    Report Status 09/27/2015 FINAL  Final   Organism ID, Bacteria ESCHERICHIA COLI  Final      Susceptibility   Escherichia coli - MIC*    AMPICILLIN >=32 RESISTANT Resistant     CEFAZOLIN >=64 RESISTANT Resistant     CEFTRIAXONE >=64 RESISTANT Resistant     CIPROFLOXACIN >=4 RESISTANT Resistant     GENTAMICIN 2 SENSITIVE Sensitive     IMIPENEM <=0.25 SENSITIVE Sensitive     NITROFURANTOIN 128 RESISTANT Resistant     TRIMETH/SULFA >=320 RESISTANT Resistant     AMPICILLIN/SULBACTAM 16 INTERMEDIATE Intermediate     PIP/TAZO <=4 SENSITIVE Sensitive     * >=100,000 COLONIES/mL ESCHERICHIA COLI  Blood Culture (routine x 2)     Status: None   Collection Time: 10/08/15 10:55 PM  Result Value Ref Range Status    Specimen Description BLOOD LEFT HAND  Final   Special Requests BOTTLES DRAWN AEROBIC AND ANAEROBIC 5CC  Final   Culture  Setup Time   Final    GRAM POSITIVE COCCI IN CLUSTERS IN BOTH AEROBIC AND ANAEROBIC BOTTLES CRITICAL RESULT CALLED TO, READ BACK BY AND VERIFIED WITH: L ROBBINS RN 1847 10/09/15 A BROWNING    Culture   Final    STAPHYLOCOCCUS SPECIES (COAGULASE NEGATIVE)  THE SIGNIFICANCE OF ISOLATING THIS ORGANISM FROM A SINGLE SET OF BLOOD CULTURES WHEN MULTIPLE SETS ARE DRAWN IS UNCERTAIN. PLEASE NOTIFY THE MICROBIOLOGY DEPARTMENT WITHIN ONE WEEK IF SPECIATION AND SENSITIVITIES ARE REQUIRED.    Report Status 10/12/2015 FINAL  Final  Blood Culture (routine x 2)     Status: None   Collection Time: 10/08/15 11:05 PM  Result Value Ref Range Status   Specimen Description BLOOD RIGHT HAND  Final   Special Requests BOTTLES DRAWN AEROBIC AND ANAEROBIC 5CC  Final   Culture NO GROWTH 5 DAYS  Final   Report Status 10/13/2015 FINAL  Final  Urine culture     Status: None   Collection Time: 10/08/15 11:49 PM  Result Value Ref Range Status   Specimen Description URINE, CATHETERIZED  Final   Special Requests NONE  Final   Culture   Final    >=100,000 COLONIES/mL ESCHERICHIA COLI Confirmed Extended Spectrum Beta-Lactamase Producer (ESBL)    Report Status 10/11/2015 FINAL  Final   Organism ID, Bacteria ESCHERICHIA COLI  Final      Susceptibility   Escherichia coli - MIC*    AMPICILLIN >=32 RESISTANT Resistant     CEFAZOLIN >=64 RESISTANT Resistant     CEFTRIAXONE >=64 RESISTANT Resistant     CIPROFLOXACIN >=4 RESISTANT Resistant     GENTAMICIN <=1 SENSITIVE Sensitive     IMIPENEM <=0.25 SENSITIVE Sensitive     NITROFURANTOIN 64 INTERMEDIATE Intermediate     TRIMETH/SULFA >=320 RESISTANT Resistant     AMPICILLIN/SULBACTAM 16 INTERMEDIATE Intermediate     PIP/TAZO <=4 SENSITIVE Sensitive     * >=100,000 COLONIES/mL ESCHERICHIA COLI  MRSA PCR Screening     Status: None   Collection Time:  10/09/15  4:10 AM  Result Value Ref Range Status   MRSA by PCR NEGATIVE NEGATIVE Final    Comment:        The GeneXpert MRSA Assay (FDA approved for NASAL specimens only), is one component of a comprehensive MRSA colonization surveillance program. It is not intended to diagnose MRSA infection nor to guide or monitor treatment for MRSA infections.   Culture, blood (routine x 2)     Status: None (Preliminary result)   Collection Time: 10/18/15  4:02 AM  Result Value Ref Range Status   Specimen Description BLOOD LEFT WRIST  Final   Special Requests BOTTLES DRAWN AEROBIC AND ANAEROBIC 10CC   Final   Culture NO GROWTH 2 DAYS  Final   Report Status PENDING  Incomplete  Culture, blood (routine x 2)     Status: None (Preliminary result)   Collection Time: 10/18/15  4:10 AM  Result Value Ref Range Status   Specimen Description BLOOD RIGHT ANTECUBITAL  Final   Special Requests BOTTLES DRAWN AEROBIC AND ANAEROBIC 10CC   Final   Culture NO GROWTH 2 DAYS  Final   Report Status PENDING  Incomplete  Culture, Urine     Status: None   Collection Time: 10/18/15  6:36 AM  Result Value Ref Range Status   Specimen Description URINE, SUPRAPUBIC  Final   Special Requests NONE  Final   Culture >=100,000 COLONIES/mL YEAST  Final   Report Status 10/20/2015 FINAL  Final    Studies/Results: No results found.    Assessment/Plan:  INTERVAL HISTORY:  Low grade temp on carbapenem Yeast growing from culture from suprapubic   Active Problems:   CP (cerebral palsy), spastic ([name])   Chronic suprapubic catheter ([name])   Shock ([name])   Pressure ulcer  Sepsis ([name])   Septic shock ([name])   Anemia due to other cause   RUQ abdominal tenderness   RUQ pain   Cholecystitis, acute   Fever    [name] [name] is a 39 y.o. female with  with CP who was initially admitted for urosepsis treated for ESBL ecoli complicated uti but also found to have acute cholecystitis POD#5 s/p lap chole, has had persistent  fever x 10  Days taken off carbapenem by Dr. Drue Second in case this was drug fever. She also has yeast in culture from suprapubic  #1 FUO: could be drug fever, fever curve a bit better since stopping the carbapenem. She needs her SUPRAPUBIC EXCHANGED  #2 FUNGURIA: SHE NEEDS TO HAVE HER SUPRAPUBIC EXCHANGED TO ERADICATE THIS  IF RN AT BEDSIDE UNABLE TO DO WOULD ASK UROLOGY FOR HELP  I WILL START FLUCONAZOLE UNTIL THIS CAN BE TAKEN CARE OF    LOS: 12 days   Acey Lav 10/21/2015, 12:21 PM

## 2015-10-21 NOTE — Progress Notes (Signed)
Patient has order to replace supra pubic catheter, currently the size she has is 22 but this campus doesn't have any, called [facility].  [facility] at [facility], called Security to pick it up.  Oncoming nurse will be made aware.

## 2015-10-21 NOTE — Progress Notes (Addendum)
CSW spoke with Family Medicine MD- patient is not stable for d/c today- she spiked a fever last night. Need IJ removed and change of suprapubic cath per MD note.  Possible d/c to SNF tomorrow if stable per MD.  [facility]; [facility]  Will asked weekend CSW coverage tomorrow to follow up re: possible d/c.  Lorri Frederickonna T. Andria RheinCrowder, LCSW [phone]   CSW [facility] to [facility] and was told that they do not have anyone working the weekend.  CSW spoke with Reyne Dumasammy Blakely- RN Liaison for [facility]- will accept patient over the weekend if medically stable. Lorri Frederickonna T. Jaci LazierCrowder, [place]CS[place] [phone]

## 2015-10-22 LAB — URINE MICROSCOPIC-ADD ON

## 2015-10-22 LAB — URINALYSIS, ROUTINE W REFLEX MICROSCOPIC
BILIRUBIN URINE: NEGATIVE
Glucose, UA: NEGATIVE mg/dL
KETONES UR: NEGATIVE mg/dL
NITRITE: NEGATIVE
PH: 7.5 (ref 5.0–8.0)
Protein, ur: NEGATIVE mg/dL
Specific Gravity, Urine: 1.01 (ref 1.005–1.030)

## 2015-10-22 LAB — BASIC METABOLIC PANEL
ANION GAP: 7 (ref 5–15)
CALCIUM: 8.6 mg/dL — AB (ref 8.9–10.3)
CO2: 28 mmol/L (ref 22–32)
Chloride: 105 mmol/L (ref 101–111)
Creatinine, Ser: 0.45 mg/dL (ref 0.44–1.00)
GFR calc Af Amer: >60 mL/min (ref >60)
Glucose, Bld: 103 mg/dL — ABNORMAL HIGH (ref 65–99)
POTASSIUM: 4.1 mmol/L (ref 3.5–5.1)
SODIUM: 140 mmol/L (ref 135–145)

## 2015-10-22 LAB — CBC
HEMATOCRIT: 30.8 % — AB (ref 36.0–46.0)
Hemoglobin: 9.1 g/dL — ABNORMAL LOW (ref 12.0–15.0)
MCH: 27.5 pg (ref 26.0–34.0)
MCHC: 29.5 g/dL — AB (ref 30.0–36.0)
MCV: 93.1 fL (ref 78.0–100.0)
PLATELETS: 459 10*3/uL — AB (ref 150–400)
RBC: 3.31 MIL/uL — ABNORMAL LOW (ref 3.87–5.11)
RDW: 17.8 % — AB (ref 11.5–15.5)
WBC: 7.6 10*3/uL (ref 4.0–10.5)

## 2015-10-22 MED ORDER — FLUCONAZOLE 100 MG PO TABS: 200.0000 mg | ORAL_TABLET | Freq: Every day | ORAL | Status: DC

## 2015-10-22 NOTE — Progress Notes (Addendum)
Rehab Nurse called and said they will be changing the suprapubic catheter in the night.  Dr. Sampson GoonFitzgerald notified.  No new orders given.

## 2015-10-22 NOTE — Progress Notes (Addendum)
Family Medicine Teaching Service Daily Progress Note Intern Pager: [phone]  Patient name: [name] [name] Medical record number: [id number] Date of birth: Oct 13, 1976 Age: 39 y.o. Gender: female  Primary Care Provider: Luiz Blare, DO Consultants: ID, Surgery Code Status: FULL  Pt Overview and Major Events to Date:  11/6-11/7: Admitted for urosepsis; refractory to 4 litres of fluid with sbp 84 despite low dose levophed. Has been off pressors for most of 11/7. [place] cath changed as well. Started on Vanc and Imipenem  11/8: Transferred to floor from [place] 11/9: discontinued Vanc due to urine cultures showing ESBL and no growth with blood cultures  11/10: hgb 6.7; no active bleeding; transfuse 1 unit pRBC  11/11: Abx switched to Ertapenem; MRCP (unable to be obtain due to baclofen pump) 11/12: Surgery consulted, on CT abd/pelvis dx concern acute choley > to OR today for lap choley 11/17: Ertapenem stopped 11/19: Fluconazole for fungemia  Assessment and Plan:  # Acute Choleycstitis s/p lap choley (11/12) - Continue pain control, anti-emetics - Surgery recs: okay to dc when medically ready; surgery f/u arranged - Alk phos: 248 (11/14) > 199 > 200 > 184 > 162 - normal T bili 0.7 > 0.6  # Breakthrough Fever: Possibly related to a drug fever per ID recommendations. No obvious source of infection noted from CXR, exam. She has been stable s/p perc drain. Tmax of 99.9 in last 24 hours - repeat blood cultures: pending. Culture from IJ drawn 11/18.  - repeat blood culture if spiking fever 48hrs s/p antibiotics withdrawal.  #Fungemia: urine culture grew >100k colonies of yeast - Fluconazole 22m daily IV per ID - Will need to replace suprapubic catheter  # ESBL E Coli Pyelonephritis w/ suprapubic catheter - Improving / Severe Sepsis - Resolved. Suprapubic catheter changed 10/09/15 - Received 7 days of IV ertapenem.  - Blood culture 11/6: coagulase negative staph in one bottle (likely  contaminant); second bottle NGTD x 5 day  # Fecal Impaction: on CT in rectosigmoid colon: Patient denies feeling constipated, however states that she is on Miralax BID at home. S/p enema. (Resolved)  # Normocytic Anemia, presumed chronic IDA: s/p feraheme Hgb 7.0>7.4 > 6.7 (11/10) > 8.2 >7.8>8.8 >8.2 > 7.5 > 8.6 > 8.5 > 8.6; on admission 9.5. No active bleeding noted clinically. FOBT negative.  - Daily CBCs - 1 unit pRBC transfused 11/10; hgb stable - anemia studies: Ferritin 58; Fe 9; TIBC 241; saturation ratio 4%; folate 16.1; Vit B12 1076 (H)  # Transaminitis, likely in setting of severe sepsis and possible shock liver - Resolved   # Hypokalemia - Daily BMETs - replete as needed  # Major Depression/Anxiety: - cont home Klonopin  - cont home Seroquel  # GERD: - protonix  # Stage 1 Decubitus Ulcer: erythema of skin but no skin breakdown. No tenderness.  - prophylaxis with dressing per nursing   # Hx PE with hx of anticoagulation: Per patient, [facility] was told to stop taking anticoagulation until outpatient follow up.  - per chart review in care everywhere, [facility] in the setting of anticoagulation use (xeralto). She was taken to the OR on 09/27/15 for IVC filter placement. At the time of discharge, her Xarelto was held until her follow up appointment in 2 weeks with Dr. EAmalia Haileyand a plan will need to be made regarding anticoagulation and her IVC  filter at this appointment - Additionally per chart review, patient's hemoglobin had been unstable after initiation of systemic anticoagulation per urology - will continue to hold Xeralto  # Cerebral Palsy - Tizanidine and Baclofen   FEN/GI: normal diet; NS 75cc/hr  PPx: SQ Heparin   Disposition: pending transition to oral fluconazole. Plan to discharge to SNF  Subjective:   Discussed episode of elevated temperature last night. She states she felt sick, otherwise, nothing notable. She is not having nausea or vomiting. Her RUQ pain is present mainly when taking deep breaths.  Objective: Temp:  [97.6 F (36.4 C)-99.9 F (37.7 C)] 98.3 F (36.8 C) (11/20 0533) Pulse Rate:  [72-122] 75 (11/20 0533) Resp:  [16-20] 16 (11/20 0533) BP: (97-137)/(55-108) 97/59 mmHg (11/20 0533) SpO2:  [93 %-98 %] 95 % (11/20 0533) Weight:  [124 lb 6.1 oz (56.418 kg)] 124 lb 6.1 oz (56.418 kg) (11/19 2043)   Physical Exam: General: NAD, resting in bed. Initially sleeping but easily arousable  HEENT: Neck contracted to right, left IJ in place;  MMM Cardiovascular: RRR, no m/r/g Respiratory: CTAB bilaterally Abdomen: incisions dry and without erythema; soft, non-tender, non-distended, minimally tender in RUQ. Pump palpated in RLQ Extremities: warm and dry  Laboratory:  Recent Labs Lab 10/19/15 0555 10/20/15 0520 10/21/15 0500  WBC 5.9 6.0 7.7  HGB 8.5* 8.6* 8.9*  HCT 27.6* 28.7* 29.4*  PLT 356 395 429*    Recent Labs Lab 10/18/15 1041 10/19/15 0555 10/20/15 0520 10/21/15 0500  NA 138 139 140 141  K 4.2 3.4* 3.7 3.4*  CL 105 104 105 105  CO2 _0 BUN <5* <5* <5* <5*  CREATININE 0.41* 0.37* 0.43* 0.43*  CALCIUM 8.2* 8.3* 8.3* 8.3*  PROT 5.5* 5.4* 5.7*  --   BILITOT 0.5 0.7 0.6  --   ALKPHOS 200* 184* 162*  --   ALT _1 --   AST 20 11* 14*  --   GLUCOSE 138* 106* 105* 114*   Anemia Panel - Iron 9, TIBC 241, Sat ratio 4, ferritin 58 Folate 16 Vitamin B-12, 1076  Imaging/Diagnostic Tests: ECHO: EF 65-70%; normal  CXR 11/6: IMPRESSION: Negative for pneumonia.  RUQ Abd [place] (11/7) IMPRESSION: Cholelithiasis with indeterminate presence of acute cholecystitis given conflicting findings (there is gallbladder distention and mild wall thickening but no focal tenderness). HIDA scan could evaluate cystic duct patency.  CXR  11/7: IMPRESSION: 1. Left IJ central line with tip at the upper right atrium. No pneumothorax. 2. Progressive hypoventilation and perihilar atelectasis.  CT Abd / Pelvis w/ contrast 11/11 IMPRESSION: 1. Significant distension of the gallbladder, gallstones, and new gallbladder wall thickening consistent with acute cholecystitis. 2. Small bilateral pleural effusions, body wall edema. 3. Significant stool burden within the rectosigmoid colon suggests fecal impaction. 4. Suprapubic catheter unremarkable in appearance. Urinary bladder wall is thickened. 5. Smaller collections posterior to the urinary bladder compared prior study.  CXR 11/16 IMPRESSION: Limited study by poor inspiration. Cardiomediastinal silhouette is stable. Mild basilar atelectasis. No active disease.  Mariel Aloe, MD 10/22/2015, 7:09 AM PGY-3, [place] Intern pager: [phone], text pages welcome

## 2015-10-22 NOTE — Progress Notes (Signed)
[facility]    Subjective: No new complaints  Antibiotics:  Anti-infectives    Start     Dose/Rate Route Frequency Ordered Stop   10/23/15 1000  fluconazole (DIFLUCAN) tablet 200 mg     200 mg Oral Daily 10/22/15 1142     10/21/15 1300  fluconazole (DIFLUCAN) IVPB 200 mg     200 mg 100 mL/hr over 60 Minutes Intravenous Every 24 hours 10/21/15 1227 10/22/15 1416   10/13/15 0600  ertapenem (INVANZ) 1 g in sodium chloride 0.9 % 50 mL IVPB  Status:  Discontinued     1 g 100 mL/hr over 30 Minutes Intravenous Every 24 hours 10/12/15 1519 10/19/15 1748   10/09/15 2000  vancomycin (VANCOCIN) IVPB 750 mg/150 ml premix  Status:  Discontinued     750 mg 150 mL/hr over 60 Minutes Intravenous Every 12 hours 10/09/15 1134 10/11/15 1427   10/09/15 1200  imipenem-cilastatin (PRIMAXIN) 250 mg in sodium chloride 0.9 % 100 mL IVPB     250 mg 200 mL/hr over 30 Minutes Intravenous 4 times per day 10/09/15 1134 10/13/15 0553   10/09/15 1100  vancomycin (VANCOCIN) 500 mg in sodium chloride 0.9 % 100 mL IVPB  Status:  Discontinued     500 mg 100 mL/hr over 60 Minutes Intravenous Every 8 hours 10/09/15 0229 10/09/15 0429   10/09/15 1000  piperacillin-tazobactam (ZOSYN) IVPB 3.375 g  Status:  Discontinued     3.375 g 12.5 mL/hr over 240 Minutes Intravenous Every 8 hours 10/09/15 0229 10/09/15 0428   10/09/15 0800  piperacillin-tazobactam (ZOSYN) IVPB 3.375 g  Status:  Discontinued     3.375 g 12.5 mL/hr over 240 Minutes Intravenous Every 8 hours 10/09/15 0428 10/09/15 0920   10/09/15 0800  vancomycin (VANCOCIN) 500 mg in sodium chloride 0.9 % 100 mL IVPB  Status:  Discontinued     500 mg 100 mL/hr over 60 Minutes Intravenous Every 8 hours 10/09/15 0429 10/09/15 1134   10/09/15 0230  piperacillin-tazobactam (ZOSYN) IVPB 3.375 g  Status:  Discontinued     3.375 g 12.5 mL/hr over 240 Minutes Intravenous Every 8 hours 10/09/15 0221 10/09/15 0428   10/09/15  0230  vancomycin (VANCOCIN) IVPB 1000 mg/200 mL premix  Status:  Discontinued     1,000 mg 200 mL/hr over 60 Minutes Intravenous  Once 10/09/15 0221 10/09/15 0427   10/09/15 0045  piperacillin-tazobactam (ZOSYN) IVPB 3.375 g     3.375 g 100 mL/hr over 30 Minutes Intravenous  Once 10/09/15 0032 10/09/15 0217   10/09/15 0045  vancomycin (VANCOCIN) IVPB 1000 mg/200 mL premix     1,000 mg 200 mL/hr over 60 Minutes Intravenous  Once 10/09/15 0032 10/09/15 0243   10/09/15 0030  cefTRIAXone (ROCEPHIN) 1 g in dextrose 5 % 50 mL IVPB     1 g 100 mL/hr over 30 Minutes Intravenous  Once 10/09/15 0018 10/09/15 0107      Medications: Scheduled Meds: . baclofen  20 mg Oral QID  . clonazePAM  0.5 mg Oral 3 times per day  . [START ON 10/23/2015] fluconazole  200 mg Oral Daily  . heparin subcutaneous  5,000 Units Subcutaneous 3 times per day  . pantoprazole  40 mg Oral QHS  . QUEtiapine  100 mg Oral QHS  . tiZANidine  4 mg Oral 2 times per day   Continuous Infusions: . lactated ringers 10 mL/hr at 10/14/15 1247   PRN  Meds:.acetaminophen, alteplase, diphenhydrAMINE, oxyCODONE, promethazine **OR** promethazine **OR** promethazine, sodium chloride    Objective: Weight change: 11.2 oz (0.318 kg)  Intake/Output Summary (Last 24 hours) at 10/22/15 1722 Last data filed at 10/22/15 0942  Gross per 24 hour  Intake    600 ml  Output    700 ml  Net   -100 ml   Blood pressure 125/63, pulse 75, temperature 101.9 F (38.8 C), temperature source Oral, resp. rate 17, height 5' (1.524 m), weight 124 lb 6.1 oz (56.418 kg), last menstrual period 04/05/2011, SpO2 98 %. Temp:  [97.8 F (36.6 C)-101.9 F (38.8 C)] 101.9 F (38.8 C) (11/20 1720) Pulse Rate:  [64-122] 75 (11/20 1720) Resp:  [16-20] 17 (11/20 1720) BP: (97-137)/(57-108) 125/63 mmHg (11/20 1720) SpO2:  [93 %-98 %] 98 % (11/20 1720) Weight:  [124 lb 6.1 oz (56.418 kg)] 124 lb 6.1 oz (56.418 kg) (11/19 2043)  Physical Exam: General: Alert  and awake, oriented x3, not in any acute distress. HEENT: anicteric sclera, pupils reactive to light and accommodation, EOMI CVS regular rate, normal r,  no murmur rubs or gallops Chest: clear to auscultation bilaterally, no wheezing, rales or rhonchi Abdomen: soft nontender,normal bowel sounds, Extremities: contractures Skin: no rashes  Neuro: flaccid LE with spasticity  CBC: CBC Latest Ref Rng 10/22/2015 10/21/2015 10/20/2015  WBC 4.0 - 10.5 K/uL 7.6 7.7 6.0  Hemoglobin 12.0 - 15.0 g/dL 6.9(G) 8.9(L) 8.6(L)  Hematocrit 36.0 - 46.0 % 30.8(L) 29.4(L) 28.7(L)  Platelets 150 - 400 K/uL 459(H) 429(H) 395       BMET  Recent Labs  10/21/15 0500 10/22/15 0538  NA 141 140  K 3.4* 4.1  CL 105 105  CO2 28 28  GLUCOSE 114* 103*  BUN <5* <5*  CREATININE 0.43* 0.45  CALCIUM 8.3* 8.6*     Liver Panel   Recent Labs  10/20/15 0520  PROT 5.7*  ALBUMIN 2.1*  AST 14*  ALT 14  ALKPHOS 162*  BILITOT 0.6       Sedimentation Rate No results for input(s): ESRSEDRATE in the last 72 hours. C-Reactive Protein No results for input(s): CRP in the last 72 hours.  Micro Results: Recent Results (from the past 720 hour(s))  Urine culture     Status: None   Collection Time: 09/24/15  3:18 PM  Result Value Ref Range Status   Specimen Description URINE, RANDOM  Final   Special Requests NONE  Final   Culture   Final    >=100,000 COLONIES/mL ESCHERICHIA COLI Confirmed Extended Spectrum Beta-Lactamase Producer (ESBL)    Report Status 09/27/2015 FINAL  Final   Organism ID, Bacteria ESCHERICHIA COLI  Final      Susceptibility   Escherichia coli - MIC*    AMPICILLIN >=32 RESISTANT Resistant     CEFAZOLIN >=64 RESISTANT Resistant     CEFTRIAXONE >=64 RESISTANT Resistant     CIPROFLOXACIN >=4 RESISTANT Resistant     GENTAMICIN 2 SENSITIVE Sensitive     IMIPENEM <=0.25 SENSITIVE Sensitive     NITROFURANTOIN 128 RESISTANT Resistant     TRIMETH/SULFA >=320 RESISTANT Resistant      AMPICILLIN/SULBACTAM 16 INTERMEDIATE Intermediate     PIP/TAZO <=4 SENSITIVE Sensitive     * >=100,000 COLONIES/mL ESCHERICHIA COLI  Blood Culture (routine x 2)     Status: None   Collection Time: 10/08/15 10:55 PM  Result Value Ref Range Status   Specimen Description BLOOD LEFT HAND  Final   Special Requests BOTTLES DRAWN AEROBIC AND ANAEROBIC  5CC  Final   Culture  Setup Time   Final    GRAM POSITIVE COCCI IN CLUSTERS IN BOTH AEROBIC AND ANAEROBIC BOTTLES CRITICAL RESULT CALLED TO, READ BACK BY AND VERIFIED WITH: L ROBBINS RN 1847 10/09/15 A BROWNING    Culture   Final    STAPHYLOCOCCUS SPECIES (COAGULASE NEGATIVE) THE SIGNIFICANCE OF ISOLATING THIS ORGANISM FROM A SINGLE SET OF BLOOD CULTURES WHEN MULTIPLE SETS ARE DRAWN IS UNCERTAIN. PLEASE NOTIFY THE MICROBIOLOGY DEPARTMENT WITHIN ONE WEEK IF SPECIATION AND SENSITIVITIES ARE REQUIRED.    Report Status 10/12/2015 FINAL  Final  Blood Culture (routine x 2)     Status: None   Collection Time: 10/08/15 11:05 PM  Result Value Ref Range Status   Specimen Description BLOOD RIGHT HAND  Final   Special Requests BOTTLES DRAWN AEROBIC AND ANAEROBIC 5CC  Final   Culture NO GROWTH 5 DAYS  Final   Report Status 10/13/2015 FINAL  Final  Urine culture     Status: None   Collection Time: 10/08/15 11:49 PM  Result Value Ref Range Status   Specimen Description URINE, CATHETERIZED  Final   Special Requests NONE  Final   Culture   Final    >=100,000 COLONIES/mL ESCHERICHIA COLI Confirmed Extended Spectrum Beta-Lactamase Producer (ESBL)    Report Status 10/11/2015 FINAL  Final   Organism ID, Bacteria ESCHERICHIA COLI  Final      Susceptibility   Escherichia coli - MIC*    AMPICILLIN >=32 RESISTANT Resistant     CEFAZOLIN >=64 RESISTANT Resistant     CEFTRIAXONE >=64 RESISTANT Resistant     CIPROFLOXACIN >=4 RESISTANT Resistant     GENTAMICIN <=1 SENSITIVE Sensitive     IMIPENEM <=0.25 SENSITIVE Sensitive     NITROFURANTOIN 64 INTERMEDIATE  Intermediate     TRIMETH/SULFA >=320 RESISTANT Resistant     AMPICILLIN/SULBACTAM 16 INTERMEDIATE Intermediate     PIP/TAZO <=4 SENSITIVE Sensitive     * >=100,000 COLONIES/mL ESCHERICHIA COLI  MRSA PCR Screening     Status: None   Collection Time: 10/09/15  4:10 AM  Result Value Ref Range Status   MRSA by PCR NEGATIVE NEGATIVE Final    Comment:        The GeneXpert MRSA Assay (FDA approved for NASAL specimens only), is one component of a comprehensive MRSA colonization surveillance program. It is not intended to diagnose MRSA infection nor to guide or monitor treatment for MRSA infections.   Culture, blood (routine x 2)     Status: None (Preliminary result)   Collection Time: 10/18/15  4:02 AM  Result Value Ref Range Status   Specimen Description BLOOD LEFT WRIST  Final   Special Requests BOTTLES DRAWN AEROBIC AND ANAEROBIC 10CC   Final   Culture NO GROWTH 4 DAYS  Final   Report Status PENDING  Incomplete  Culture, blood (routine x 2)     Status: None (Preliminary result)   Collection Time: 10/18/15  4:10 AM  Result Value Ref Range Status   Specimen Description BLOOD RIGHT ANTECUBITAL  Final   Special Requests BOTTLES DRAWN AEROBIC AND ANAEROBIC 10CC   Final   Culture NO GROWTH 4 DAYS  Final   Report Status PENDING  Incomplete  Culture, Urine     Status: None   Collection Time: 10/18/15  6:36 AM  Result Value Ref Range Status   Specimen Description URINE, SUPRAPUBIC  Final   Special Requests NONE  Final   Culture >=100,000 COLONIES/mL YEAST  Final   Report  Status 10/20/2015 FINAL  Final  Culture, blood (single)     Status: None (Preliminary result)   Collection Time: 10/20/15  9:30 AM  Result Value Ref Range Status   Specimen Description BLOOD RIGHT CENTRAL LINE  Final   Special Requests BOTTLES DRAWN AEROBIC AND ANAEROBIC 10MLS  Final   Culture NO GROWTH 2 DAYS  Final   Report Status PENDING  Incomplete    Studies/Results: No results  found.    Assessment/Plan:  INTERVAL HISTORY:  Low grade temp on carbapenem Yeast growing from culture from suprapubic 10/21/15: fluconazole started, suprapubic to be changed on 10/22/15  Active Problems:   CP (cerebral palsy), spastic ([name])   Chronic suprapubic catheter ([name])   Shock ([name])   Pressure ulcer   Sepsis ([name])   Septic shock ([name])   Anemia due to other cause   RUQ abdominal tenderness   RUQ pain   Cholecystitis, acute   Fever   Suprapubic tenderness   Fungus present in urine   FUO (fever of unknown origin)    [name] SlickerJudith [name] is a 39 y.o. female with  with CP who was initially admitted for urosepsis treated for ESBL ecoli complicated uti but also found to have acute cholecystitis POD#5 s/p lap chole, has had persistent fever x 10  Days taken off carbapenem by Dr. Drue SecondSnider in case this was drug fever. She also has yeast in culture from suprapubic. One temperature now to 101.9  #1 FUO: could be drug fever, fever curve a bit better since stopping the carbapenem. She needs her SUPRAPUBIC EXCHANGED  #2 FUNGURIA: SHE NEEDS TO HAVE HER SUPRAPUBIC EXCHANGED TO ERADICATE THIS  Continue  FLUCONAZOLE UNTIL THIS CAN BE TAKEN CARE OF  Dr. Drue SecondSnider is back tomorrow.    LOS: 13 days   [name] LavCornelius Van [name] 10/22/2015, 5:22 PM

## 2015-10-22 NOTE — Progress Notes (Signed)
Patient's temp was 101.9 paged the doctor, didn't want me to give Tylenol as she wanted to get the blood cultures and also ordered urinalysis.  Patient c/o nausea, administered phenergan.  Will continue to monitor.

## 2015-10-22 NOTE — Progress Notes (Signed)
Rehab nurse called for placing supra-pubic catheter, asked if we have the whole set of catheter with tray but we just have the right size 22 cath without tray.  She told she will call her director to see if she can do with out the seal.  Dr. Sampson GoonFitzgerald made aware of the situation.  Waiting for nurse reply.

## 2015-10-23 ENCOUNTER — Telehealth: Payer: Self-pay | Admitting: Neurology

## 2015-10-23 LAB — BASIC METABOLIC PANEL
ANION GAP: 8 (ref 5–15)
BUN: <5 mg/dL — ABNORMAL LOW (ref 6–20)
CALCIUM: 8.2 mg/dL — AB (ref 8.9–10.3)
CHLORIDE: 103 mmol/L (ref 101–111)
CO2: 27 mmol/L (ref 22–32)
CREATININE: 0.48 mg/dL (ref 0.44–1.00)
GFR calc non Af Amer: >60 mL/min (ref >60)
Glucose, Bld: 118 mg/dL — ABNORMAL HIGH (ref 65–99)
Potassium: 3.3 mmol/L — ABNORMAL LOW (ref 3.5–5.1)
SODIUM: 138 mmol/L (ref 135–145)

## 2015-10-23 LAB — CULTURE, BLOOD (ROUTINE X 2)
CULTURE: NO GROWTH
Culture: NO GROWTH

## 2015-10-23 LAB — DIFFERENTIAL
BASOS ABS: 0 10*3/uL (ref 0.0–0.1)
BASOS PCT: 0 %
Eosinophils Absolute: 0.1 10*3/uL (ref 0.0–0.7)
Eosinophils Relative: 1 %
Lymphocytes Relative: 16 %
Lymphs Abs: 1.9 10*3/uL (ref 0.7–4.0)
Monocytes Absolute: 0.8 10*3/uL (ref 0.1–1.0)
Monocytes Relative: 6 %
NEUTROS ABS: 9.6 10*3/uL — AB (ref 1.7–7.7)
NEUTROS PCT: 77 %

## 2015-10-23 LAB — CBC
HCT: 31.2 % — ABNORMAL LOW (ref 36.0–46.0)
HEMOGLOBIN: 9.5 g/dL — AB (ref 12.0–15.0)
MCH: 27.9 pg (ref 26.0–34.0)
MCHC: 30.4 g/dL (ref 30.0–36.0)
MCV: 91.8 fL (ref 78.0–100.0)
PLATELETS: 435 10*3/uL — AB (ref 150–400)
RBC: 3.4 MIL/uL — AB (ref 3.87–5.11)
RDW: 17.9 % — ABNORMAL HIGH (ref 11.5–15.5)
WBC: 12.4 10*3/uL — AB (ref 4.0–10.5)

## 2015-10-23 LAB — C-REACTIVE PROTEIN: CRP: 9.9 mg/dL — AB (ref <1.0)

## 2015-10-23 LAB — SEDIMENTATION RATE: SED RATE: 50 mm/h — AB (ref 0–22)

## 2015-10-23 LAB — GLUCOSE, CAPILLARY: GLUCOSE-CAPILLARY: 111 mg/dL — AB (ref 65–99)

## 2015-10-23 MED ORDER — FOSFOMYCIN TROMETHAMINE 3 G PO PACK
3.0000 g | PACK | Freq: Once | ORAL | Status: AC
  Administered 2015-10-25: 3 g via ORAL
  Filled 2015-10-23: qty 3

## 2015-10-23 MED ORDER — GI COCKTAIL ~~LOC~~: 30.0000 mL | Freq: Two times a day (BID) | ORAL | Status: DC | PRN

## 2015-10-23 MED ORDER — POLYETHYLENE GLYCOL 3350 17 G PO PACK
17.0000 g | PACK | Freq: Two times a day (BID) | ORAL | Status: DC
  Administered 2015-10-23: 17 g via ORAL
  Filled 2015-10-23 (×2): qty 1

## 2015-10-23 MED ORDER — FOSFOMYCIN TROMETHAMINE 3 G PO PACK
3.0000 g | PACK | Freq: Once | ORAL | Status: AC
  Administered 2015-10-23: 3 g via ORAL
  Filled 2015-10-23: qty 3

## 2015-10-23 MED ORDER — POTASSIUM CHLORIDE CRYS ER 20 MEQ PO TBCR
20.0000 meq | EXTENDED_RELEASE_TABLET | Freq: Two times a day (BID) | ORAL | Status: AC
  Administered 2015-10-23: 20 meq via ORAL
  Filled 2015-10-23: qty 1

## 2015-10-23 MED ORDER — BACID PO TABS
2.0000 | ORAL_TABLET | Freq: Three times a day (TID) | ORAL | Status: DC
  Administered 2015-10-23: 2 via ORAL
  Filled 2015-10-23 (×5): qty 2

## 2015-10-23 NOTE — Progress Notes (Signed)
[facility]    Date of Admission:  10/08/2015   Total days of antibiotics 10        Day 3 fluconazole           ID: Mindi SlickerJudith Samuelson is a 39 y.o. female with  FUO Active Problems:   CP (cerebral palsy), spastic ([name])   Chronic suprapubic catheter ([name])   Shock ([name])   Pressure ulcer   Sepsis ([name])   Septic shock ([name])   Anemia due to other cause   RUQ abdominal tenderness   RUQ pain   Cholecystitis, acute   Fever   Suprapubic tenderness   Fungus present in urine   FUO (fever of unknown origin)    Subjective: Fever to 100.5, but patient not complaining of fever. Still has abdominal pain, throbbing, recently given oxycodone. She states that she gagged with the potassium powder  Interval hx: repeat culture and changed suprapubic catheter yesterday  Medications:  . baclofen  20 mg Oral QID  . clonazePAM  0.5 mg Oral 3 times per day  . fosfomycin  3 g Oral Once  . [START ON 10/25/2015] fosfomycin  3 g Oral Once  . heparin subcutaneous  5,000 Units Subcutaneous 3 times per day  . pantoprazole  40 mg Oral QHS  . potassium chloride  20 mEq Oral BID  . QUEtiapine  100 mg Oral QHS  . tiZANidine  4 mg Oral 2 times per day    Objective: Vital signs in last 24 hours: Temp:  [98.4 F (36.9 C)-101.9 F (38.8 C)] 100.5 F (38.1 C) (11/21 1200) Pulse Rate:  [75-140] 114 (11/21 1200) Resp:  [16-18] 16 (11/21 0922) BP: (69-125)/(34-76) 111/53 mmHg (11/21 1200) SpO2:  [95 %-100 %] 100 % (11/21 0922) Weight:  [123 lb 7.3 oz (56 kg)] 123 lb 7.3 oz (56 kg) (11/20 2035) Physical Exam  Constitutional:  oriented to person, place. appears in No distress.  HENT: [place]/AT, PERRLA, no scleral icterus,  Neck: rotated to the right. Right IJ in place Mouth/Throat: Oropharynx is clear and moist. No oropharyngeal exudate.  Cardiovascular: Normal rate, regular rhythm and normal heart sounds. Exam reveals no gallop and no friction rub.  No murmur heard.    Pulmonary/Chest: Effort normal and breath sounds normal. No respiratory distress.  has no wheezes.  Abdominal: Soft. Bowel sounds are normal.  exhibits no distension. There is no tenderness.  Neurological: contracture of extremities. Flaccid lower extremities Skin: Skin is warm and dry. No rash noted. No erythema.    Lab Results  Recent Labs  10/22/15 0538 10/23/15 0454  WBC 7.6 12.4*  HGB 9.1* 9.5*  HCT 30.8* 31.2*  NA 140 138  K 4.1 3.3*  CL 105 103  CO2 28 27  BUN <5* <5*  CREATININE 0.45 0.48   Microbiology: 10/20 blood cx pending 10/20 ua showing pyuria, cx pending, yeast present Studies/Results: cxr unrevealing  Assessment/Plan: 39yo F with cerebral palsy with suprapubic cath with recurrent ESBL ecoli uti, s/p lap chole for acute cholecystitis. FUO despite had been off of abtx for concern of drug fever, but the patient still having high daily fevers And now leukocytosis with lower abdominal discomfort, concerning for cystitis  - presumed catheter associated uti = we will treat with fosfomycin 3gm QOD x 3 doses to see if it helps her symptoms - leukocytosis = will check differential, cultures pending - FUO, still on going fevers = will check sed rate and crp, and auto immune  markers - hypokalemia = she has central line which can be used for potassium supplementation instead of oral repletion - abdominal discomfort = unclear if it can also be GERD related. Will do a trial of magic mouthwash to see if it helps her symptoms  Drue Second [facility] Cell: [phone] Pager: [phone]  10/23/2015, 12:51 PM

## 2015-10-23 NOTE — Telephone Encounter (Signed)
[phone].

## 2015-10-23 NOTE — Progress Notes (Signed)
Family Medicine Teaching Service Daily Progress Note Intern Pager: [phone]  Patient name: [name] [name] Medical record number: [id number] Date of birth: 02-04-1976 Age: 39 y.o. Gender: female  Primary Care Provider: Luiz Blare, DO Consultants: ID, Surgery Code Status: FULL  Pt Overview and Major Events to Date:  11/6-11/7: Admitted for urosepsis; refractory to 4 litres of fluid with sbp 84 despite low dose levophed. Has been off pressors for most of 11/7. [place] cath changed as well. Started on Vanc and Imipenem  11/8: Transferred to floor from [place] 11/9: discontinued Vanc due to urine cultures showing ESBL and no growth with blood cultures  11/10: hgb 6.7; no active bleeding; transfuse 1 unit pRBC  11/11: Abx switched to Ertapenem; MRCP (unable to be obtain due to baclofen pump) 11/12: Surgery consulted, on CT abd/pelvis dx concern acute choley > to OR today for lap choley 11/17: Ertapenem stopped 11/19: Fluconazole for fungemia 11/20: Fever spike 101.9F (repeated cultures); changed suprapubic catheter due to funguria, d/c fluconazole per ID  Assessment and Plan:  # Breakthrough Fever: Possibly related to a drug fever per ID. No obvious source of infection noted from CXR, exam. Tmax 101.9 around 1700 11/20. Afebrile since suprapubic catheter change due to funguria. Currently off antibiotics. However, now with leukocytosis 12.4 (11/21) - 11/16: repeat blood cultures: NG x 4days   - 11/18 Blood Cx IJ: NG x 2 days - 11/20 blood and urine cultures: pending - 11/20 UA: Mod LE, neg nitrite, TNTC WBC, yeast, many bacteria - repeat blood culture if spiking fever 48hrs s/p antibiotics withdrawal. - ID consulted, appreciate recs   #Funguria: 11/16 urine culture grew >100k colonies of yeast - d/c fluconazole 11/21 after  replacing suprapubic catheter 11/20 (per ID)  # ESBL E Coli Pyelonephritis w/ suprapubic catheter - Improving / Severe Sepsis - Resolved. Suprapubic catheter changed  10/09/15 - completed abx therapy: IV Ertapenem ( 11/11-11/17); Imipenem 11/7-11/10) - Blood culture 11/6: coagulase negative staph in one bottle (likely contaminant); second bottle NGTD x 5 day (final)  # Acute Choleycstitis s/p lap choley (11/12) - Continue pain control, anti-emetics - Surgery recs: okay to dc when medically ready; surgery f/u arranged - Alk phos: 248 (11/14) > 199 > 200 > 184 > 162 - normal T bili 0.7 > 0.6  # Hypokalemia: 3.3 - Daily BMETs - Kdur  39mq BID x 1   # Normocytic Anemia, presumed chronic IDA: s/p feraheme Hgb 7.0>7.4 > 6.7 (11/10) >>> 8.6> 9.5; on admission 9.5. No active bleeding noted clinically. FOBT negative.  - Daily CBCs - 1 unit pRBC transfused 11/10; hgb stable - anemia studies: Ferritin 58; Fe 9; TIBC 241; saturation ratio 4%; folate 16.1; Vit B12 1076 (H); s/p IV Iron 11/11 and 11/15  # Transaminitis, likely in setting of severe sepsis and possible shock liver - Resolved   # Fecal Impaction: on CT in rectosigmoid colon: Patient denies feeling constipated, however states that she is on Miralax BID at home. S/p enema. (Resolved) - will start home Miralax for bowel regimen   # Major Depression/Anxiety: - cont home Klonopin  - cont home Seroquel  # GERD: - protonix  # Stage 1 Decubitus Ulcer: erythema of skin but no skin breakdown. No tenderness.  - prophylaxis with dressing per nursing   # Hx PE with hx of anticoagulation: Per patient, [facility] was told to stop taking anticoagulation until outpatient follow up.  - per chart review in  care everywhere, patient was admitted to [facility] for vaginal bleeding and acute symptomatic anemia in Oct 2016 in the setting of anticoagulation use (xeralto). She was taken to the OR on 09/27/15 for IVC filter placement. At the time of discharge, her Xarelto was held until her follow up appointment in 2 weeks with Dr. Amalia Hailey and a plan will  need to be made regarding anticoagulation and her IVC filter at this appointment - Additionally per chart review, patient's hemoglobin had been unstable after initiation of systemic anticoagulation per urology - will continue to hold Xeralto  # Cerebral Palsy - Tizanidine and Baclofen   FEN/GI: normal diet PPx: SQ Heparin   Disposition: pending transition to oral fluconazole. Plan to discharge to SNF  Subjective:  - febrile yesterday evening at 101.64F (before subprapubic catheter was changed). Per nursing, catheter was changed around 10pm 11/20, therefore Fluconazole was discontinued per ID recs. - denies nausea currently, states only occurs when she has a fever - continues to have some abdominal pain but overall improved - requests that we restart home Miralax. Denies constipation.   Objective: Temp:  [97.8 F (36.6 C)-101.9 F (38.8 C)] 99.8 F (37.7 C) (11/21 0514) Pulse Rate:  [64-140] 90 (11/21 0514) Resp:  [16-18] 16 (11/21 0514) BP: (93-125)/(48-76) 93/48 mmHg (11/21 0514) SpO2:  [95 %-98 %] 96 % (11/21 0514) Weight:  [123 lb 7.3 oz (56 kg)] 123 lb 7.3 oz (56 kg) (11/20 2035)   Physical Exam: General: NAD, resting in bed. Initially sleeping but easily arousable  HEENT: Neck contracted to right, left IJ in place;  MMM Cardiovascular: RRR, no m/r/g Respiratory: CTAB bilaterally Abdomen: incisions dry and without erythema; soft, non-tender, non-distended, minimally tender in RUQ. Pump palpated in RLQ Extremities: warm and dry  Laboratory:  Recent Labs Lab 10/21/15 0500 10/22/15 0538 10/23/15 0454  WBC 7.7 7.6 12.4*  HGB 8.9* 9.1* 9.5*  HCT 29.4* 30.8* 31.2*  PLT 429* 459* 435*    Recent Labs Lab 10/18/15 1041 10/19/15 0555 10/20/15 0520 10/21/15 0500 10/22/15 0538 10/23/15 0454  NA 138 139 140 141 140 138  K 4.2 3.4* 3.7 3.4* 4.1 3.3*  CL 105 104 105 105 105 103  CO2 '27 29 29 28 28 27  ' BUN <5* <5* <5* <5* <5* <5*  CREATININE 0.41* 0.37* 0.43* 0.43*  0.45 0.48  CALCIUM 8.2* 8.3* 8.3* 8.3* 8.6* 8.2*  PROT 5.5* 5.4* 5.7*  --   --   --   BILITOT 0.5 0.7 0.6  --   --   --   ALKPHOS 200* 184* 162*  --   --   --   ALT '19 16 14  ' --   --   --   AST 20 11* 14*  --   --   --   GLUCOSE 138* 106* 105* 114* 103* 118*   Anemia Panel - Iron 9, TIBC 241, Sat ratio 4, ferritin 58 Folate 16 Vitamin B-12, 1076  Imaging/Diagnostic Tests: ECHO: EF 65-70%; normal  CXR 11/6: IMPRESSION: Negative for pneumonia.  RUQ Abd [place] (11/7) IMPRESSION: Cholelithiasis with indeterminate presence of acute cholecystitis given conflicting findings (there is gallbladder distention and mild wall thickening but no focal tenderness). HIDA scan could evaluate cystic duct patency.  CXR 11/7: IMPRESSION: 1. Left IJ central line with tip at the upper right atrium. No pneumothorax. 2. Progressive hypoventilation and perihilar atelectasis.  CT Abd / Pelvis w/ contrast 11/11 IMPRESSION: 1. Significant distension of the gallbladder, gallstones, and new gallbladder wall thickening  consistent with acute cholecystitis. 2. Small bilateral pleural effusions, body wall edema. 3. Significant stool burden within the rectosigmoid colon suggests fecal impaction. 4. Suprapubic catheter unremarkable in appearance. Urinary bladder wall is thickened. 5. Smaller collections posterior to the urinary bladder compared prior study.  CXR 11/16 IMPRESSION: Limited study by poor inspiration. Cardiomediastinal silhouette is stable. Mild basilar atelectasis. No active disease.  Smiley Houseman, MD 10/23/2015, 7:34 AM PGY-1, [place] Intern pager: [phone], text pages welcome

## 2015-10-23 NOTE — Progress Notes (Signed)
Patient running fever of 101- PRN tylenol to be given. Temp will be rechecked

## 2015-10-23 NOTE — Clinical Social Work Note (Signed)
CSW talked with [facility] director[facility]. Per Larita FifeLynn, nursing staff is currently reviewing patient's information and they she will respond as to whether they can accept patient.  Genelle BalVanessa Dalphine Cowie, MSW, LCSW Licensed Clinical Social Worker Clinical Social Work Department [facility] [phone]

## 2015-10-23 NOTE — Telephone Encounter (Signed)
Spoke to RutherfordJudy and she is still in the Hospital. Relayed to her Jason[facility]. [phone].

## 2015-10-23 NOTE — Care Management Important Message (Signed)
Important Message  Patient Details  Name: Mindi SlickerJudith Dupee MRN: [id number] Date of Birth: 1976-10-26   Medicare Important Message Given:  Yes    Jozey Janco, Annamarie MajorCheryl U, RN 10/23/2015, 11:31 AM

## 2015-10-23 NOTE — Telephone Encounter (Signed)
Called ,patient back and left her a message.

## 2015-10-24 DIAGNOSIS — T83518D Infection and inflammatory reaction due to other urinary catheter, subsequent encounter: Secondary | ICD-10-CM

## 2015-10-24 DIAGNOSIS — D72829 Elevated white blood cell count, unspecified: Secondary | ICD-10-CM

## 2015-10-24 DIAGNOSIS — E876 Hypokalemia: Secondary | ICD-10-CM

## 2015-10-24 LAB — CBC
HCT: 31.1 % — ABNORMAL LOW (ref 36.0–46.0)
HEMOGLOBIN: 9.5 g/dL — AB (ref 12.0–15.0)
MCH: 28.7 pg (ref 26.0–34.0)
MCHC: 30.5 g/dL (ref 30.0–36.0)
MCV: 94 fL (ref 78.0–100.0)
PLATELETS: 430 10*3/uL — AB (ref 150–400)
RBC: 3.31 MIL/uL — AB (ref 3.87–5.11)
RDW: 17.7 % — ABNORMAL HIGH (ref 11.5–15.5)
WBC: 11.8 10*3/uL — AB (ref 4.0–10.5)

## 2015-10-24 LAB — BASIC METABOLIC PANEL
ANION GAP: 9 (ref 5–15)
BUN: 8 mg/dL (ref 6–20)
CALCIUM: 8.2 mg/dL — AB (ref 8.9–10.3)
CO2: 26 mmol/L (ref 22–32)
CREATININE: 0.58 mg/dL (ref 0.44–1.00)
Chloride: 105 mmol/L (ref 101–111)
GFR calc non Af Amer: >60 mL/min (ref >60)
Glucose, Bld: 121 mg/dL — ABNORMAL HIGH (ref 65–99)
Potassium: 2.9 mmol/L — ABNORMAL LOW (ref 3.5–5.1)
SODIUM: 140 mmol/L (ref 135–145)

## 2015-10-24 LAB — C DIFFICILE QUICK SCREEN W PCR REFLEX
C DIFFICILE (CDIFF) INTERP: POSITIVE
C DIFFICILE (CDIFF) TOXIN: POSITIVE — AB
C DIFFICLE (CDIFF) ANTIGEN: POSITIVE — AB

## 2015-10-24 LAB — ANTINUCLEAR ANTIBODIES, IFA: ANA Ab, IFA: NEGATIVE

## 2015-10-24 LAB — MAGNESIUM: MAGNESIUM: 2 mg/dL (ref 1.7–2.4)

## 2015-10-24 MED ORDER — VANCOMYCIN 50 MG/ML ORAL SOLUTION
500.0000 mg | Freq: Four times a day (QID) | ORAL | Status: DC
  Administered 2015-10-25 – 2015-10-28 (×15): 500 mg via ORAL
  Filled 2015-10-24 (×18): qty 10

## 2015-10-24 MED ORDER — POTASSIUM CHLORIDE 10 MEQ/50ML IV SOLN
10.0000 meq | INTRAVENOUS | Status: AC
  Administered 2015-10-24 (×4): 10 meq via INTRAVENOUS
  Filled 2015-10-24 (×4): qty 50

## 2015-10-24 MED ORDER — ONDANSETRON HCL 4 MG/2ML IJ SOLN
4.0000 mg | Freq: Three times a day (TID) | INTRAMUSCULAR | Status: DC | PRN
  Administered 2015-10-24: 4 mg via INTRAVENOUS

## 2015-10-24 MED ORDER — ENSURE ENLIVE PO LIQD
237.0000 mL | Freq: Two times a day (BID) | ORAL | Status: DC
  Administered 2015-10-24 – 2015-10-27 (×5): 237 mL via ORAL

## 2015-10-24 MED ORDER — SODIUM CHLORIDE 0.9 % IV BOLUS (SEPSIS)
1000.0000 mL | Freq: Once | INTRAVENOUS | Status: AC
  Administered 2015-10-24: 1000 mL via INTRAVENOUS

## 2015-10-24 NOTE — Progress Notes (Signed)
Patient's b/p low this morning- MD on call paged. States that patient will need bolus this morning. RN preparing to give at this time. Will hold morning (zanaflex and clonidine) meds after speaking with charge nurse.

## 2015-10-24 NOTE — Progress Notes (Signed)
Patient ID: [name] [name], female   DOB: 04-Aug-1976, 39 y.o.   MRN: [id number]     [facility]      [address] [place], [place] [address]    Phone: [phone] FAX: [phone]     Subjective: Difficult to understand. Having Fevers, WBC up.  Had episodes of hypotension.  Objective:  Vital signs:  Filed Vitals:   10/24/15 0500 10/24/15 0511 10/24/15 0558 10/24/15 0831  BP: '77/43 70/30 76/30 ' 108/45  Pulse: 89 79 80 77  Temp: 98.5 F (36.9 C)   98 F (36.7 C)  TempSrc: Oral   Oral  Resp: 16   16  Height:      Weight:      SpO2: 97%   98%    Last BM Date: 10/23/15  Intake/Output   Yesterday:  11/21 0701 - 11/22 0700 In: 840 [P.O.:840] Out: 1 [Stool:1] This shift:  Total I/O In: 220 [P.O.:220] Out: 601 [Urine:600; Stool:1]   Physical Exam: Abdomen: Soft.  Nondistended.  Non tender.  Incisions are c/d/i.   No evidence of peritonitis.  No incarcerated hernias.    Problem List:   Active Problems:   Major depressive disorder, recurrent episode ([name])   CP (cerebral palsy), spastic ([name])   Chronic suprapubic catheter ([name])   Recurrent pulmonary embolism ([name])   GERD (gastroesophageal reflux disease)   Urinary tract infectious disease   Shock ([place])   Pressure ulcer   Anemia due to other cause   RUQ pain   Acute cholecystitis s/p lap cholecystectomy 10/14/2015   Suprapubic tenderness   Fungus present in urine   FUO (fever of unknown origin)    Results:   Labs: Results for orders placed or performed during the hospital encounter of 10/08/15 (from the past 48 hour(s))  Culture, blood (routine x 2)     Status: None (Preliminary result)   Collection Time: 10/22/15  6:49 PM  Result Value Ref Range   Specimen Description BLOOD RIGHT HAND    Special Requests IN PEDIATRIC BOTTLE 1CC    Culture NO GROWTH < 24 HOURS    Report Status PENDING   Urinalysis, Routine w reflex microscopic (not at [facility])     Status:  Abnormal   Collection Time: 10/22/15  7:56 PM  Result Value Ref Range   Color, Urine YELLOW YELLOW   APPearance CLOUDY (A) CLEAR   Specific Gravity, Urine 1.010 1.005 - 1.030   pH 7.5 5.0 - 8.0   Glucose, UA NEGATIVE NEGATIVE mg/dL   Hgb urine dipstick SMALL (A) NEGATIVE   Bilirubin Urine NEGATIVE NEGATIVE   Ketones, ur NEGATIVE NEGATIVE mg/dL   Protein, ur NEGATIVE NEGATIVE mg/dL   Nitrite NEGATIVE NEGATIVE   Leukocytes, UA MODERATE (A) NEGATIVE  Urine microscopic-add on     Status: Abnormal   Collection Time: 10/22/15  7:56 PM  Result Value Ref Range   Squamous Epithelial / LPF 0-5 (A) NONE SEEN    Comment: Please note change in reference range.   WBC, UA TOO NUMEROUS TO COUNT 0 - 5 WBC/hpf    Comment: Please note change in reference range.   RBC / HPF 0-5 0 - 5 RBC/hpf    Comment: Please note change in reference range.   Bacteria, UA MANY (A) NONE SEEN    Comment: Please note change in reference range.   Urine-Other YEAST PRESENT   Culture, blood (routine x 2)     Status: None (Preliminary result)  Collection Time: 10/22/15  8:18 PM  Result Value Ref Range   Specimen Description BLOOD LEFT ANTECUBITAL    Special Requests BOTTLES DRAWN AEROBIC ONLY 8CC    Culture NO GROWTH < 24 HOURS    Report Status PENDING   Differential     Status: Abnormal   Collection Time: 10/23/15  4:50 AM  Result Value Ref Range   Neutrophils Relative % 77 %   Neutro Abs 9.6 (H) 1.7 - 7.7 K/uL   Lymphocytes Relative 16 %   Lymphs Abs 1.9 0.7 - 4.0 K/uL   Monocytes Relative 6 %   Monocytes Absolute 0.8 0.1 - 1.0 K/uL   Eosinophils Relative 1 %   Eosinophils Absolute 0.1 0.0 - 0.7 K/uL   Basophils Relative 0 %   Basophils Absolute 0.0 0.0 - 0.1 K/uL  Basic metabolic panel     Status: Abnormal   Collection Time: 10/23/15  4:54 AM  Result Value Ref Range   Sodium 138 135 - 145 mmol/L   Potassium 3.3 (L) 3.5 - 5.1 mmol/L    Comment: DELTA CHECK NOTED   Chloride 103 101 - 111 mmol/L   CO2 27  22 - 32 mmol/L   Glucose, Bld 118 (H) 65 - 99 mg/dL   BUN <5 (L) 6 - 20 mg/dL   Creatinine, Ser 0.48 0.44 - 1.00 mg/dL   Calcium 8.2 (L) 8.9 - 10.3 mg/dL   GFR calc non Af Amer >60 >60 mL/min   GFR calc Af Amer >60 >60 mL/min    Comment: (NOTE) The eGFR has been calculated using the CKD EPI equation. This calculation has not been validated in all clinical situations. eGFR's persistently <60 mL/min signify possible Chronic Kidney Disease.    Anion gap 8 5 - 15  CBC     Status: Abnormal   Collection Time: 10/23/15  4:54 AM  Result Value Ref Range   WBC 12.4 (H) 4.0 - 10.5 K/uL   RBC 3.40 (L) 3.87 - 5.11 MIL/uL   Hemoglobin 9.5 (L) 12.0 - 15.0 g/dL   HCT 31.2 (L) 36.0 - 46.0 %   MCV 91.8 78.0 - 100.0 fL   MCH 27.9 26.0 - 34.0 pg   MCHC 30.4 30.0 - 36.0 g/dL   RDW 17.9 (H) 11.5 - 15.5 %   Platelets 435 (H) 150 - 400 K/uL  Glucose, capillary     Status: Abnormal   Collection Time: 10/23/15  8:08 AM  Result Value Ref Range   Glucose-Capillary 111 (H) 65 - 99 mg/dL  Sedimentation rate     Status: Abnormal   Collection Time: 10/23/15  5:50 PM  Result Value Ref Range   Sed Rate 50 (H) 0 - 22 mm/hr  C-reactive protein     Status: Abnormal   Collection Time: 10/23/15  5:50 PM  Result Value Ref Range   CRP 9.9 (H) <1.0 mg/dL  Magnesium     Status: None   Collection Time: 10/24/15  5:45 AM  Result Value Ref Range   Magnesium 2.0 1.7 - 2.4 mg/dL  Basic metabolic panel     Status: Abnormal   Collection Time: 10/24/15  5:54 AM  Result Value Ref Range   Sodium 140 135 - 145 mmol/L   Potassium 2.9 (L) 3.5 - 5.1 mmol/L   Chloride 105 101 - 111 mmol/L   CO2 26 22 - 32 mmol/L   Glucose, Bld 121 (H) 65 - 99 mg/dL   BUN 8 6 - 20 mg/dL  Creatinine, Ser 0.58 0.44 - 1.00 mg/dL   Calcium 8.2 (L) 8.9 - 10.3 mg/dL   GFR calc non Af Amer >60 >60 mL/min   GFR calc Af Amer >60 >60 mL/min    Comment: (NOTE) The eGFR has been calculated using the CKD EPI equation. This calculation has not  been validated in all clinical situations. eGFR's persistently <60 mL/min signify possible Chronic Kidney Disease.    Anion gap 9 5 - 15  CBC     Status: Abnormal   Collection Time: 10/24/15  5:54 AM  Result Value Ref Range   WBC 11.8 (H) 4.0 - 10.5 K/uL   RBC 3.31 (L) 3.87 - 5.11 MIL/uL   Hemoglobin 9.5 (L) 12.0 - 15.0 g/dL   HCT 31.1 (L) 36.0 - 46.0 %   MCV 94.0 78.0 - 100.0 fL   MCH 28.7 26.0 - 34.0 pg   MCHC 30.5 30.0 - 36.0 g/dL   RDW 17.7 (H) 11.5 - 15.5 %   Platelets 430 (H) 150 - 400 K/uL   *Note: Due to a large number of results and/or encounters for the requested time period, some results have not been displayed. A complete set of results can be found in Results Review.    Imaging / Studies: No results found.  Medications / Allergies:  Scheduled Meds: . baclofen  20 mg Oral QID  . clonazePAM  0.5 mg Oral 3 times per day  . [START ON 10/25/2015] fosfomycin  3 g Oral Once  . heparin subcutaneous  5,000 Units Subcutaneous 3 times per day  . lactobacillus acidophilus  2 tablet Oral TID  . pantoprazole  40 mg Oral QHS  . polyethylene glycol  17 g Oral BID  . potassium chloride  10 mEq Intravenous Q1 Hr x 4  . QUEtiapine  100 mg Oral QHS  . tiZANidine  4 mg Oral 2 times per day   Continuous Infusions: . lactated ringers 10 mL/hr at 10/14/15 1247   PRN Meds:.acetaminophen, alteplase, diphenhydrAMINE, gi cocktail, oxyCODONE, promethazine **OR** promethazine **OR** promethazine, sodium chloride  Antibiotics: Anti-infectives    Start     Dose/Rate Route Frequency Ordered Stop   10/23/15 1000  fluconazole (DIFLUCAN) tablet 200 mg  Status:  Discontinued     200 mg Oral Daily 10/22/15 1142 10/23/15 0643   10/21/15 1300  fluconazole (DIFLUCAN) IVPB 200 mg     200 mg 100 mL/hr over 60 Minutes Intravenous Every 24 hours 10/21/15 1227 10/22/15 1416   10/13/15 0600  ertapenem (INVANZ) 1 g in sodium chloride 0.9 % 50 mL IVPB  Status:  Discontinued     1 g 100 mL/hr over 30  Minutes Intravenous Every 24 hours 10/12/15 1519 10/19/15 1748   10/09/15 2000  vancomycin (VANCOCIN) IVPB 750 mg/150 ml premix  Status:  Discontinued     750 mg 150 mL/hr over 60 Minutes Intravenous Every 12 hours 10/09/15 1134 10/11/15 1427   10/09/15 1200  imipenem-cilastatin (PRIMAXIN) 250 mg in sodium chloride 0.9 % 100 mL IVPB     250 mg 200 mL/hr over 30 Minutes Intravenous 4 times per day 10/09/15 1134 10/13/15 0553   10/09/15 1100  vancomycin (VANCOCIN) 500 mg in sodium chloride 0.9 % 100 mL IVPB  Status:  Discontinued     500 mg 100 mL/hr over 60 Minutes Intravenous Every 8 hours 10/09/15 0229 10/09/15 0429   10/09/15 1000  piperacillin-tazobactam (ZOSYN) IVPB 3.375 g  Status:  Discontinued     3.375 g 12.5 mL/hr  over 240 Minutes Intravenous Every 8 hours 10/09/15 0229 10/09/15 0428   10/09/15 0800  piperacillin-tazobactam (ZOSYN) IVPB 3.375 g  Status:  Discontinued     3.375 g 12.5 mL/hr over 240 Minutes Intravenous Every 8 hours 10/09/15 0428 10/09/15 0920   10/09/15 0800  vancomycin (VANCOCIN) 500 mg in sodium chloride 0.9 % 100 mL IVPB  Status:  Discontinued     500 mg 100 mL/hr over 60 Minutes Intravenous Every 8 hours 10/09/15 0429 10/09/15 1134   10/09/15 0230  piperacillin-tazobactam (ZOSYN) IVPB 3.375 g  Status:  Discontinued     3.375 g 12.5 mL/hr over 240 Minutes Intravenous Every 8 hours 10/09/15 0221 10/09/15 0428   10/09/15 0230  vancomycin (VANCOCIN) IVPB 1000 mg/200 mL premix  Status:  Discontinued     1,000 mg 200 mL/hr over 60 Minutes Intravenous  Once 10/09/15 0221 10/09/15 0427   10/09/15 0045  piperacillin-tazobactam (ZOSYN) IVPB 3.375 g     3.375 g 100 mL/hr over 30 Minutes Intravenous  Once 10/09/15 0032 10/09/15 0217   10/09/15 0045  vancomycin (VANCOCIN) IVPB 1000 mg/200 mL premix     1,000 mg 200 mL/hr over 60 Minutes Intravenous  Once 10/09/15 0032 10/09/15 0243   10/09/15 0030  cefTRIAXone (ROCEPHIN) 1 g in dextrose 5 % 50 mL IVPB     1 g 100  mL/hr over 30 Minutes Intravenous  Once 10/09/15 0018 10/09/15 0107        Assessment/Plan S/p laparoscopic cholecystectomy 10/14/15 by Dr. Grandville Silos ID-continues to have fevers. Tmax 101.  -consider removing central line(placed 15 days ago) -could also consider dopplers to r/o DVT, may be the less likely the source -doubt source is from intra-abdominal source, at this point any imaging will show post op changes. No need for imaging at this time.   Erby Pian, [facility] Surgery Pager [phone]) For consults and floor pages call [phone])  10/24/2015 11:11 AM

## 2015-10-24 NOTE — Progress Notes (Signed)
Family Medicine Teaching Service Daily Progress Note Intern Pager: [phone]  Patient name: [name] [name] Medical record number: [id number] Date of birth: [birth year] Age: 39 y.o. Gender: female  Primary Care Provider: Luiz Blare, DO Consultants: ID, Surgery Code Status: FULL  Pt Overview and Major Events to Date:  11/6-11/7: Admitted for urosepsis; refractory to 4 litres of fluid with sbp 84 despite low dose levophed. Has been off pressors for most of 11/7. [place] cath changed as well. Started on Vanc and Imipenem  11/8: Transferred to floor from [place] 11/9: discontinued Vanc due to urine cultures showing ESBL and no growth with blood cultures  11/10: hgb 6.7; no active bleeding; transfuse 1 unit pRBC  11/11: Abx switched to Ertapenem; MRCP (unable to be obtain due to baclofen pump) 11/12: Surgery consulted, on CT abd/pelvis dx concern acute choley > to OR today for lap choley 11/17: Ertapenem stopped 11/19: Fluconazole for fungemia 11/20: Fever spike 101.87F (repeated cultures); changed suprapubic catheter due to funguria, d/c fluconazole per ID 11/21: Fosfomycin started for presumed cystitis  Assessment and Plan:  # Fever of Unknown Origin: Possibly related to a drug fever per ID. No obvious source of infection noted from CXR, exam. Tmax 101 around 11/21 associated with tachycardia in 110s. Leukoctyosis 12.4 > 11.8 On fosfomycin for presumed Cystitis.  - 11/16: repeat blood cultures: NG x 5 days (final 11/21) - 11/18 Blood Cx IJ: NG x 3 days - 11/20 blood cultures: NG < 24 hrs - 11/20 UA: Mod LE, neg nitrite, TNTC WBC, yeast, many bacteria - ID consulted, appreciate recs: ESR, CRP, Autoimmune markers; treating for presumed cystitis - CRP 9.9, ESR 50, ANA pending  # Presumed catheter associated UTI: Repeat UA 11/20 mod LE, neg nitrite, TMTC WBC, many bact . Lower abdominal discomfort concerning for cystitis.  - Per ID, Fosfomycin x 3 doses   #Hypotension: 11/21 overnight to  70s/40s. Patient asymptomatic. Given 1L bolus - improved BP this AM 108/45 - will continue to monitor closely   # Hypokalemia: 3.3 > 2.9   - Daily BMETs - recheck Mag  - IV K 41mq x 4 today  # Normocytic Anemia, presumed chronic IDA: s/p feraheme Hgb 7.0>7.4 > 6.7 (11/10) >>>9.5 > 9.5; on admission 9.5. No active bleeding noted clinically. FOBT negative.  - Daily CBCs - 1 unit pRBC transfused 11/10; hgb stable - anemia studies: Ferritin 58; Fe 9; TIBC 241; saturation ratio 4%; folate 16.1; Vit B12 1076 (H); s/p IV Iron 11/11 and 11/15  #Funguria- resolved: 11/16 urine culture grew >100k colonies of yeast - d/c fluconazole 11/21 after replacing suprapubic catheter 11/20 (per ID)  # ESBL E Coli Pyelonephritis w/ suprapubic catheter - Resolved. Suprapubic catheter changed 10/09/15 and again 11/20 - completed abx therapy: IV Ertapenem ( 11/11-11/17); Imipenem 11/7-11/10) - Blood culture 11/6: coagulase negative staph in one bottle (likely contaminant); second bottle NGTD x 5 day (final)  # Acute Choleycstitis s/p lap choley (11/12) - Continue pain control, anti-emetics PRN - Surgery recs: okay to dc when medically ready; surgery f/u arranged  # Transaminitis, likely in setting of severe sepsis and possible shock liver - Resolved   # Fecal Impaction: on CT in rectosigmoid colon: (Resolved) -home Miralax for bowel regimen  - added probiotic with recent abx   # Major Depression/Anxiety: - cont home Klonopin  - cont home Seroquel  # GERD: - protonix - magic mouth wash  # Stage 1 Decubitus Ulcer: erythema of skin but no skin breakdown. No tenderness.  -  prophylaxis with dressing per nursing   # Hx PE with hx of anticoagulation: Per patient, had recent vaginal bleeding which was evaluated at [facility] this month; was told to stop taking anticoagulation until outpatient follow up.  - per chart review in care everywhere, patient was admitted to [facility] for vaginal bleeding and  acute symptomatic anemia in Oct 2016 in the setting of anticoagulation use (xeralto). She was taken to the OR on 09/27/15 for IVC filter placement. At the time of discharge, her Xarelto was held until her follow up appointment in 2 weeks with Dr. Amalia Hailey and a plan will need to be made regarding anticoagulation and her IVC filter at this appointment - Additionally per chart review, patient's hemoglobin had been unstable after initiation of systemic anticoagulation per urology - will continue to hold Xeralto  # Cerebral Palsy - Tizanidine and Baclofen   FEN/GI: normal diet PPx: SQ Heparin   Disposition: pending work up of FUO. Plan to discharge to SNF  Subjective:  - febrile with tmax of 101 over past 24 hours. Patient also was hypotensive to 70s/40s early this AM but responded well to 1L bolus  - notes of lower abdominal pain today   Objective: Temp:  [98 F (36.7 C)-101 F (38.3 C)] 98 F (36.7 C) (11/22 0831) Pulse Rate:  [77-128] 77 (11/22 0831) Resp:  [16] 16 (11/22 0831) BP: (69-118)/(30-76) 108/45 mmHg (11/22 0831) SpO2:  [94 %-100 %] 98 % (11/22 0831) Weight:  [120 lb 6.4 oz (54.613 kg)-122 lb 6.4 oz (55.52 kg)] 122 lb 6.4 oz (55.52 kg) (11/22 0225)   Physical Exam: General: NAD, resting in bed. Initially sleeping but easily arousable  HEENT: Neck contracted to right, left IJ in place;  MMM Cardiovascular: RRR, no m/r/g Respiratory: CTAB bilaterally Abdomen: incisions dry and without erythema; soft,, non-distended, minimally tender suprapubic region. Pump palpated in RLQ Extremities: warm and dry  Laboratory:  Recent Labs Lab 10/22/15 0538 10/23/15 0454 10/24/15 0554  WBC 7.6 12.4* 11.8*  HGB 9.1* 9.5* 9.5*  HCT 30.8* 31.2* 31.1*  PLT 459* 435* 430*    Recent Labs Lab 10/18/15 1041 10/19/15 0555 10/20/15 0520  10/22/15 0538 10/23/15 0454 10/24/15 0554  NA 138 139 140  < > 140 138 140  K 4.2 3.4* 3.7  < > 4.1 3.3* 2.9*  CL 105 104 105  < > 105 103 105   CO2 _0 < > _1 BUN <5* <5* <5*  < > <5* <5* 8  CREATININE 0.41* 0.37* 0.43*  < > 0.45 0.48 0.58  CALCIUM 8.2* 8.3* 8.3*  < > 8.6* 8.2* 8.2*  PROT 5.5* 5.4* 5.7*  --   --   --   --   BILITOT 0.5 0.7 0.6  --   --   --   --   ALKPHOS 200* 184* 162*  --   --   --   --   ALT _2 --   --   --   --   AST 20 11* 14*  --   --   --   --   GLUCOSE 138* 106* 105*  < > 103* 118* 121*  < > = values in this interval not displayed. Anemia Panel - Iron 9, TIBC 241, Sat ratio 4, ferritin 58 Folate 16 Vitamin B-12, 1076  Imaging/Diagnostic Tests: ECHO: EF 65-70%; normal  CXR 11/6: IMPRESSION: Negative for pneumonia.  RUQ Abd [place] (11/7) IMPRESSION: Cholelithiasis with  indeterminate presence of acute cholecystitis given conflicting findings (there is gallbladder distention and mild wall thickening but no focal tenderness). HIDA scan could evaluate cystic duct patency.  CXR 11/7: IMPRESSION: 1. Left IJ central line with tip at the upper right atrium. No pneumothorax. 2. Progressive hypoventilation and perihilar atelectasis.  CT Abd / Pelvis w/ contrast 11/11 IMPRESSION: 1. Significant distension of the gallbladder, gallstones, and new gallbladder wall thickening consistent with acute cholecystitis. 2. Small bilateral pleural effusions, body wall edema. 3. Significant stool burden within the rectosigmoid colon suggests fecal impaction. 4. Suprapubic catheter unremarkable in appearance. Urinary bladder wall is thickened. 5. Smaller collections posterior to the urinary bladder compared prior study.  CXR 11/16 IMPRESSION: Limited study by poor inspiration. Cardiomediastinal silhouette is stable. Mild basilar atelectasis. No active disease.  Smiley Houseman, MD 10/24/2015, 8:37 AM PGY-1, [place] Intern pager: [phone], text pages welcome

## 2015-10-24 NOTE — Progress Notes (Signed)
Initial Nutrition Assessment  DOCUMENTATION CODES:   Not applicable  INTERVENTION:  Provide Ensure Enlive po BID, each supplement provides 350 kcal and 20 grams of protein.  Encourage adequate PO intake.   NUTRITION DIAGNOSIS:   Inadequate oral intake related to  (varied appetite) as evidenced by  (meal completion mostly 50%).  GOAL:   Patient will meet greater than or equal to 90% of their needs  MONITOR:   PO intake, Supplement acceptance, Weight trends, Labs, I & O's, Skin  REASON FOR ASSESSMENT:   Low [place]    ASSESSMENT:   39 y.o. female with with CP who was initially admitted for urosepsis treated for ESBL ecoli complicated uti but also found to have acute cholecystitis s/p lap chole 11/12.Pt continues to have a fever.  Pt was unresponsive to questions during time of visit. No family at bedside. RD unable to obtain nutrition history. Meal completion has been varied from 0-100% with most po intake at 50%. Pt with weight loss per Epic weight records (9.6% in 4 months). RD to order Ensure to aid in caloric and protein needs.   Nutrition-Focused physical exam deferred at this time as pt with cerebral palsy.  Labs and medications reviewed.   Diet Order:  Diet regular Room service appropriate?: Yes; Fluid consistency:: Thin  Skin:   (Incision on abdomen)  Last BM:  11/22  Height:   Ht Readings from Last 1 Encounters:  10/09/15 5' (1.524 m)    Weight:   Wt Readings from Last 1 Encounters:  10/24/15 122 lb 6.4 oz (55.52 kg)    Ideal Body Weight:  45.45 kg  BMI:  Body mass index is 23.9 kg/(m^2).  Estimated Nutritional Needs:   Kcal:  1500-1800  Protein:  70-85 grams  Fluid:  >/= 1.5 L/day  EDUCATION NEEDS:   No education needs identified at this time  Roslyn SmilingStephanie Arvie Bartholomew, MS, RD, LDN Pager # [phone] After hours/ weekend pager # [phone]

## 2015-10-24 NOTE — Progress Notes (Signed)
Pt with positive C. Diff results. Patient on enteric precautions and MD notified. Will continue to monitor.  [name] RossettiLaura Wilberth Damon, RN, BSN

## 2015-10-24 NOTE — Progress Notes (Signed)
[facility]    Date of Admission:  10/08/2015   Total days of antibiotics 11        Day 4 fluconazole           ID: [name] [name] is a 39 y.o. female with  FUO Active Problems:   Major depressive disorder, recurrent episode ([name])   CP (cerebral palsy), spastic ([name])   Chronic suprapubic catheter ([name])   Recurrent pulmonary embolism ([name])   GERD (gastroesophageal reflux disease)   Urinary tract infectious disease   Shock ([name])   Pressure ulcer   Anemia due to other cause   RUQ pain   Acute cholecystitis s/p lap cholecystectomy 10/14/2015   Suprapubic tenderness   Fungus present in urine   FUO (fever of unknown origin)    Subjective: tmax to 101F yesterday but afebrile this morning.    Interval hx: repeat culture are negative, received a dose of fosfomycin for UTI  Medications:  . baclofen  20 mg Oral QID  . clonazePAM  0.5 mg Oral 3 times per day  . feeding supplement (ENSURE ENLIVE)  237 mL Oral BID BM  . [START ON 10/25/2015] fosfomycin  3 g Oral Once  . heparin subcutaneous  5,000 Units Subcutaneous 3 times per day  . lactobacillus acidophilus  2 tablet Oral TID  . pantoprazole  40 mg Oral QHS  . QUEtiapine  100 mg Oral QHS  . tiZANidine  4 mg Oral 2 times per day    Objective: Vital signs in last 24 hours: Temp:  [98 F (36.7 C)-101 F (38.3 C)] 98 F (36.7 C) (11/22 0831) Pulse Rate:  [77-128] 77 (11/22 0831) Resp:  [16] 16 (11/22 0831) BP: (70-118)/(30-76) 114/58 mmHg (11/22 1336) SpO2:  [94 %-98 %] 98 % (11/22 0831) Weight:  [120 lb 6.4 oz (54.613 kg)-122 lb 6.4 oz (55.52 kg)] 122 lb 6.4 oz (55.52 kg) (11/22 0225) Physical Exam  Constitutional:  oriented to person, place. appears in No distress.  HENT: [place]/AT, PERRLA, no scleral icterus,  Neck: rotated to the right. Right IJ in place Mouth/Throat: Oropharynx is clear and moist. No oropharyngeal exudate.  Cardiovascular: Normal rate, regular rhythm and normal heart sounds.  Exam reveals no gallop and no friction rub.  No murmur heard.  Pulmonary/Chest: Effort normal and breath sounds normal. No respiratory distress.  has no wheezes.  Abdominal: Soft. Bowel sounds are normal.  exhibits no distension. There is no tenderness.  Neurological: contracture of extremities. Flaccid lower extremities Skin: Skin is warm and dry. No rash noted. No erythema.    Lab Results  Recent Labs  10/23/15 0454 10/24/15 0554  WBC 12.4* 11.8*  HGB 9.5* 9.5*  HCT 31.2* 31.1*  NA 138 140  K 3.3* 2.9*  CL 103 105  CO2 27 26  BUN <5* 8  CREATININE 0.48 0.58   Microbiology: 10/20 blood cx pending 10/20 ua showing pyuria, cx pending, yeast present Studies/Results: cxr unrevealing  Assessment/Plan: 39yo F with cerebral palsy with suprapubic cath with recurrent ESBL ecoli uti, s/p lap chole for acute cholecystitis. FUO despite had been off of abtx for concern of drug fever, but the patient still having high daily fevers And now leukocytosis with lower abdominal discomfort, concerning for cystitis  - presumed catheter associated uti = we will treat with fosfomycin 3gm QOD x 3 doses to see if it helps her symptoms. Will give a dose tomorrow - FUO, still on going fevers =  Recommend to  remove IJ as possible source of fever and agree with ruling out for DVT as possible cause of fever. - hypokalemia = she has central line which can be used for potassium supplementation instead of oral repletion   Drue SecondSNIDER, [facility] Cell: [phone] Pager: [phone]  10/24/2015, 4:49 PM

## 2015-10-24 NOTE — Evaluation (Signed)
Physical Therapy Evaluation and Discharge Patient Details Name: [name] [name] MRN: [id number] DOB: November 16, 1976 Today's Date: 10/24/2015   History of Present Illness  Adm 10/08/15 with sepsis; required ICU, pressors; ultimately required cholecystectomy 11/12 PMHx-cerebral palsy, suprapubic catheter, frequent urosepsis, PE, DVTs (has IVC filter)    Clinical Impression  Discussed with patient that PTA at SNF, nursing used a lift for bed to wheelchair (electric) transfer. Assessed her ROM and appears has remained intact to allow positioning in her wheelchair. She was raised up to chair-like position in the bed with no symptoms of orthostasis. Patient reports her dad and brother can transport her wheelchair in the back of their pickup truck (however is not easy) to bring it to hospital, if she will be here long enough to warrant their effort. Mentioned to patient that in the past we have had families pay a private transport service to bring in their chair (she normally uses [facility] and they will not transport the w/c only).   Recommend nursing continue to perform PROM of extremities as they perform daily activities with pt. Also would be beneficial to raise her up into a chair-like position (HOB ~50 degrees per pt as I raised her HOB) at least once a day for as many hours as she can tolerate to simulate her positioning in her wheelchair and maintain upright tolerance.  No further PT needs identified. Pt in agreement. Spoke with resident re: PT recommendations. PT is signing off.    Follow Up Recommendations No PT follow up    Equipment Recommendations  None recommended by PT    Recommendations for Other Services       Precautions / Restrictions Precautions Precautions: Fall      Mobility  Bed Mobility Overal bed mobility: Needs Assistance Bed Mobility: Rolling Rolling: Total assist         General bed mobility comments: assisted pt off bedpan   Transfers                     Ambulation/Gait                Stairs            Wheelchair Mobility    Modified Rankin (Stroke Patients Only)       Balance                                             Pertinent Vitals/Pain Pain Assessment: No/denies pain    Home Living Family/patient expects to be discharged to:: Skilled nursing facility                      Prior Function Level of Independence: Needs assistance   Gait / Transfers Assistance Needed: Pt reports she is not ambulatory at baseline and staff uses a lift to get to electric wheelchair  ADL's / Homemaking Assistance Needed: Total assist        Hand Dominance        Extremity/Trunk Assessment   Upper Extremity Assessment: RUE deficits/detail;LUE deficits/detail RUE Deficits / Details: grossly assessed, spastic with contractures; typically in shoulder abd, elbow flexion, fingers flexed     LUE Deficits / Details: grossly assessed, spastic with contractures; typically in shoulder in neutral, elbow flexion, fingers flexed   Lower Extremity Assessment: RLE deficits/detail;LLE deficits/detail RLE Deficits / Details: ~ 15 degree knee flexion  contractures bil; 2+ hip flexion; PROM hip and knee flexion, DF to 90 LLE Deficits / Details: ~ 15 degree knee flexion contractures bil; 2+ hip flexion; PROM hip and knee flexion, DF to 90  Cervical / Trunk Assessment: Other exceptions  Communication   Communication: Expressive difficulties (difficult to understand; better when she slows down)  Cognition Arousal/Alertness: Awake/alert Behavior During Therapy: WFL for tasks assessed/performed Overall Cognitive Status: Within Functional Limits for tasks assessed                      General Comments General comments (skin integrity, edema, etc.): Pt can answer questions intelligibly when she slows down and enunciates one word at a time. Reports [facility] (states she was  there PTA). Her dad and brother can transport w/c in the back of their pickup truck, but not easily    Exercises        Assessment/Plan    PT Assessment Patent does not need any further PT services  PT Diagnosis Generalized weakness;Other (comment) (contractures)   PT Problem List    PT Treatment Interventions     PT Goals (Current goals can be found in the Care Plan section) Acute Rehab PT Goals Patient Stated Goal: pt unsure why PT was asked to see her PT Goal Formulation: All assessment and education complete, DC therapy    Frequency     Barriers to discharge        Co-evaluation               End of Session   Activity Tolerance: Patient tolerated treatment well Patient left: in bed;with call bell/phone within reach;Other (comment) (partial chair position (HOB 48))           Time: [id number] PT Time Calculation (min) (ACUTE ONLY): 22 min   Charges:   PT Evaluation $Initial PT Evaluation Tier I: 1 Procedure     PT G Codes:        Najla Aughenbaugh 10/24/2015, 4:47 PM Pager [phone]

## 2015-10-25 ENCOUNTER — Inpatient Hospital Stay (HOSPITAL_COMMUNITY): Payer: Medicare Other

## 2015-10-25 DIAGNOSIS — A0472 Enterocolitis due to Clostridium difficile, not specified as recurrent: Secondary | ICD-10-CM | POA: Insufficient documentation

## 2015-10-25 DIAGNOSIS — R509 Fever, unspecified: Secondary | ICD-10-CM | POA: Insufficient documentation

## 2015-10-25 LAB — CULTURE, BLOOD (SINGLE): CULTURE: NO GROWTH

## 2015-10-25 LAB — BASIC METABOLIC PANEL
Anion gap: 6 (ref 5–15)
CALCIUM: 8.6 mg/dL — AB (ref 8.9–10.3)
CO2: 27 mmol/L (ref 22–32)
CREATININE: 0.38 mg/dL — AB (ref 0.44–1.00)
Chloride: 107 mmol/L (ref 101–111)
GFR calc Af Amer: >60 mL/min (ref >60)
GFR calc non Af Amer: >60 mL/min (ref >60)
GLUCOSE: 99 mg/dL (ref 65–99)
Potassium: 3.9 mmol/L (ref 3.5–5.1)
Sodium: 140 mmol/L (ref 135–145)

## 2015-10-25 LAB — CBC
HEMATOCRIT: 28.9 % — AB (ref 36.0–46.0)
Hemoglobin: 8.9 g/dL — ABNORMAL LOW (ref 12.0–15.0)
MCH: 28.6 pg (ref 26.0–34.0)
MCHC: 30.8 g/dL (ref 30.0–36.0)
MCV: 92.9 fL (ref 78.0–100.0)
Platelets: 369 10*3/uL (ref 150–400)
RBC: 3.11 MIL/uL — ABNORMAL LOW (ref 3.87–5.11)
RDW: 17.4 % — AB (ref 11.5–15.5)
WBC: 7.5 10*3/uL (ref 4.0–10.5)

## 2015-10-25 MED ORDER — PROMETHAZINE HCL 25 MG/ML IJ SOLN
12.5000 mg | Freq: Once | INTRAMUSCULAR | Status: AC
  Administered 2015-10-25: 12.5 mg via INTRAVENOUS
  Filled 2015-10-25: qty 1

## 2015-10-25 MED ORDER — PROMETHAZINE HCL 25 MG RE SUPP
25.0000 mg | Freq: Four times a day (QID) | RECTAL | Status: DC | PRN
  Filled 2015-10-25: qty 1

## 2015-10-25 MED ORDER — SODIUM CHLORIDE 0.9 % IV BOLUS (SEPSIS)
1000.0000 mL | Freq: Once | INTRAVENOUS | Status: AC
Start: 2015-10-25 — End: 2015-10-25
  Administered 2015-10-25: 1000 mL via INTRAVENOUS

## 2015-10-25 MED ORDER — PROMETHAZINE HCL 25 MG PO TABS
25.0000 mg | ORAL_TABLET | Freq: Four times a day (QID) | ORAL | Status: DC | PRN
  Administered 2015-10-25: 25 mg via ORAL
  Filled 2015-10-25: qty 1

## 2015-10-25 MED ORDER — PROMETHAZINE HCL 25 MG PO TABS: 12.5000 mg | ORAL_TABLET | Freq: Once | ORAL | Status: DC

## 2015-10-25 MED ORDER — SODIUM CHLORIDE 0.9 % IV SOLN
8.0000 mg | Freq: Three times a day (TID) | INTRAVENOUS | Status: DC | PRN
Start: 2015-10-25 — End: 2015-10-30
  Administered 2015-10-28: 8 mg via INTRAVENOUS
  Filled 2015-10-25 (×4): qty 4

## 2015-10-25 MED ORDER — PROMETHAZINE HCL 25 MG/ML IJ SOLN
25.0000 mg | Freq: Four times a day (QID) | INTRAMUSCULAR | Status: DC | PRN
  Filled 2015-10-25: qty 1

## 2015-10-25 NOTE — Clinical Social Work Note (Addendum)
Talked with patient, at her request regarding discharge plans. CSW informed patient that [facility], pending bed availability when she is medically stable. Patient was very pleased to know that [facility].  [facility] and she requested that someone do patient's admissions paperwork, so that it is done once patient ready for discharge.  Call made to patient's father - Rudell Cobbom Colucci [phone]) regarding admissions paperwork and he will contact [facility]Lynn at [facility].  Call also made to [facility] admissions director and they still do not have any open Medicaid beds. She encouraged CSW to call once patient ready for discharge.  CSW will continue to follow and facilitate discharge to a skilled facility when patient medically stable.      Genelle BalVanessa Prinston Kynard, MSW, LCSW Licensed Clinical Social Worker Clinical Social Work Department [facility] [phone]

## 2015-10-25 NOTE — Progress Notes (Signed)
Family Medicine Teaching Service Daily Progress Note Intern Pager: [phone]  Patient name: [name] [name] Medical record number: [id number] Date of birth: 1976/08/21 Age: 39 y.o. Gender: female  Primary Care Provider: Luiz Blare, DO Consultants: ID, Surgery Code Status: FULL  Pt Overview and Major Events to Date:  11/6-11/7: Admitted for urosepsis; refractory to 4 litres of fluid with sbp 84 despite low dose levophed. Has been off pressors for most of 11/7. [address] cath changed as well. Started on Vanc and Imipenem  11/8: Transferred to floor from [facility] [place] 11/9: discontinued Vanc due to urine cultures showing ESBL and no growth with blood cultures  11/10: hgb 6.7; no active bleeding; transfuse 1 unit pRBC  11/11: Abx switched to Ertapenem; MRCP (unable to be obtain due to baclofen pump) 11/12: Surgery consulted, on CT abd/pelvis dx concern acute choley > to OR today for lap choley 11/17: Ertapenem stopped 11/19: Fluconazole for fungemia 11/20: Fever spike 101.38F (repeated cultures); changed suprapubic catheter due to funguria, d/c fluconazole per ID 11/21: Fosfomycin x 1 dose for presumed cystitis 11/22: Treating for C Diff infection PO Vanc 11/23: d/c IJ  Assessment and Plan:  # C. Diff Infection, likely secondary to long course of antibiotics: Overnight, notified of >3x loose mucus stools. CDiff antigen and reflex toxin by PCR positive. 4 stools over 24 hours. RF include prolonged antibiotic therapy for ESBL UTI - enteric contact precaustions - Vancomycin 572m 4x/day PO (11/22>>) for 14 days - holding probiotic and PPI - Nausea: PRN Phenergan; increase Zofran to 853mq 8hr PRN  # Fever of Unknown Origin: Possibly related to a drug fever per ID. No obvious source of infection noted from CXR, exam. Tmax 100.7 11/22 associated with intermittent tachycardia in 120. Leukoctyosis 12.4 > 11.8 > 7.5 On fosfomycin for presumed Cystitis and now with c. Diff infection - 11/16: repeat blood  cultures: NG x 5 days (final 11/21) - 11/18 Blood Cx IJ: NG x 4 days - 11/20 blood cultures: NG 2 days - 11/20 UA: Mod LE, neg nitrite, TNTC WBC, yeast, many bacteria - ID consulted, appreciate recs: recc removal of IJ, rule out DVT (due to immobility from CP, although no clinical signs suggesting) - CRP 9.9, ESR 50, ANA negative  - bilateral LE dopplers ordered  - IV team was able to obtain peripheral access, therefore will discontinued IJ.   #Hypotension: 11/21: 70s/40s. Patient asymptomatic.s/p 1L bolus. Improved overall, however, noted to have 80/43 early AM 11/23. Given 1L bolus with good response to 97/55 - will continue to monitor closely  # Presumed catheter associated UTI, Treated: Repeat UA 11/20 mod LE, neg nitrite, TMTC WBC, many bact . Lower abdominal discomfort concerning for cystitis.  - Per ID, Fosfomycin x 1 11/21 dose sufficient   # Normocytic Anemia, presumed chronic IDA: s/p feraheme Hgb 7.0>7.4 > 6.7 (11/10) >>>9.5 > 9.5 > 8.9; on admission 9.5. No active bleeding noted clinically. FOBT negative.  - Daily CBCs - 1 unit pRBC transfused 11/10; hgb stable - anemia studies: Ferritin 58; Fe 9; TIBC 241; saturation ratio 4%; folate 16.1; Vit B12 1076 (H); s/p IV Iron 11/11 and 11/15  # Hypokalemia, Resolved: 3.3 > 2.9 > (s/p IV mag 1061mx 4) 3.9. Mg 2.0 - Daily BMETs   #Funguria- resolved: 11/16 urine culture grew >100k colonies of yeast - d/c fluconazole 11/21 after replacing suprapubic catheter 11/20 (per ID)  # ESBL E Coli Pyelonephritis w/ suprapubic catheter - Resolved. Suprapubic catheter changed 10/09/15 and again 11/20 - completed  abx therapy: IV Ertapenem ( 11/11-11/17); Imipenem 11/7-11/10) - Blood culture 11/6: coagulase negative staph in one bottle (likely contaminant); second bottle NGTD x 5 day (final)  # Acute Choleycstitis s/p lap choley (11/12) - Continue pain control, anti-emetics PRN - Surgery recs: okay to dc when medically ready; surgery f/u  arranged  # Transaminitis, likely in setting of severe sepsis and possible shock liver - Resolved   # Fecal Impaction: on CT in rectosigmoid colon: (Resolved) -home Miralax for bowel regimen  - added probiotic with recent abx   # Major Depression/Anxiety: - cont home Klonopin  - cont home Seroquel  # GERD: - protonix - magic mouth wash  # Stage 1 Decubitus Ulcer: erythema of skin but no skin breakdown. No tenderness.  - prophylaxis with dressing per nursing   # Hx PE with hx of anticoagulation: Per patient, had recent vaginal bleeding which was evaluated at [facility] this month; was told to stop taking anticoagulation until outpatient follow up.  - per chart review in care everywhere, patient was admitted to [facility] for vaginal bleeding and acute symptomatic anemia in Oct 2016 in the setting of anticoagulation use (xeralto). She was taken to the OR on 09/27/15 for IVC filter placement. At the time of discharge, her Xarelto was held until her follow up appointment in 2 weeks with Dr. Amalia Hailey and a plan will need to be made regarding anticoagulation and her IVC filter at this appointment - Additionally per chart review, patient's hemoglobin had been unstable after initiation of systemic anticoagulation per urology - will continue to hold Xeralto  # Cerebral Palsy - Tizanidine and Baclofen   FEN/GI: normal diet PPx: SQ Heparin   Disposition: pending work up of FUO. Plan to discharge to SNF  Subjective:  - febrile to 100.7 overnight  - continues to have lower abdominal pain today  - states nausea is worse today; notes she vomited yeserday   Objective: Temp:  [98 F (36.7 C)-100.7 F (38.2 C)] 98.3 F (36.8 C) (11/23 0441) Pulse Rate:  [77-120] 77 (11/23 0441) Resp:  [16-18] 18 (11/22 2017) BP: (80-120)/(43-64) 80/43 mmHg (11/23 0441) SpO2:  [97 %-98 %] 97 % (11/23 0441)   Physical Exam: General: NAD, resting in bed  HEENT: Neck contracted to right, left IJ in place;   MMM Cardiovascular: RRR, no m/r/g Respiratory: CTAB bilaterally Abdomen: incisions dry and without erythema; soft,, non-distended, Pump palpated in RLQ; tender in the lower abdominal region Extremities: warm and dry  Laboratory:  Recent Labs Lab 10/22/15 0538 10/23/15 0454 10/24/15 0554  WBC 7.6 12.4* 11.8*  HGB 9.1* 9.5* 9.5*  HCT 30.8* 31.2* 31.1*  PLT 459* 435* 430*    Recent Labs Lab 10/18/15 1041 10/19/15 0555 10/20/15 0520  10/22/15 0538 10/23/15 0454 10/24/15 0554  NA 138 139 140  < > 140 138 140  K 4.2 3.4* 3.7  < > 4.1 3.3* 2.9*  CL 105 104 105  < > 105 103 105  CO2 _0 < > _1 BUN <5* <5* <5*  < > <5* <5* 8  CREATININE 0.41* 0.37* 0.43*  < > 0.45 0.48 0.58  CALCIUM 8.2* 8.3* 8.3*  < > 8.6* 8.2* 8.2*  PROT 5.5* 5.4* 5.7*  --   --   --   --   BILITOT 0.5 0.7 0.6  --   --   --   --   ALKPHOS 200* 184* 162*  --   --   --   --  ALT _0 --   --   --   --   AST 20 11* 14*  --   --   --   --   GLUCOSE 138* 106* 105*  < > 103* 118* 121*  < > = values in this interval not displayed. Anemia Panel - Iron 9, TIBC 241, Sat ratio 4, ferritin 58 Folate 16 Vitamin B-12, 1076  Imaging/Diagnostic Tests: ECHO: EF 65-70%; normal  CXR 11/6: IMPRESSION: Negative for pneumonia.  RUQ Abd [place] (11/7) IMPRESSION: Cholelithiasis with indeterminate presence of acute cholecystitis given conflicting findings (there is gallbladder distention and mild wall thickening but no focal tenderness). HIDA scan could evaluate cystic duct patency.  CXR 11/7: IMPRESSION: 1. Left IJ central line with tip at the upper right atrium. No pneumothorax. 2. Progressive hypoventilation and perihilar atelectasis.  CT Abd / Pelvis w/ contrast 11/11 IMPRESSION: 1. Significant distension of the gallbladder, gallstones, and new gallbladder wall thickening consistent with acute cholecystitis. 2. Small bilateral pleural effusions, body wall edema. 3. Significant stool burden  within the rectosigmoid colon suggests fecal impaction. 4. Suprapubic catheter unremarkable in appearance. Urinary bladder wall is thickened. 5. Smaller collections posterior to the urinary bladder compared prior study.  CXR 11/16 IMPRESSION: Limited study by poor inspiration. Cardiomediastinal silhouette is stable. Mild basilar atelectasis. No active disease.  Smiley Houseman, MD 10/25/2015, 7:01 AM PGY-1, [place] Intern pager: [phone], text pages welcome

## 2015-10-25 NOTE — Care Management Important Message (Signed)
Important Message  Patient Details  Name: [name] SlickerJudith [name] MRN: [id number] Date of Birth: 1976/12/01   Medicare Important Message Given:  Yes    Awesome Jared, Annamarie Majorheryl U, RN 10/25/2015, 11:14 AM

## 2015-10-25 NOTE — Clinical Social Work Note (Signed)
CSW continuing to monitor patient's progress medically. Anticipated d/c facility nurse Liaison, Reyne Dumasammy Blakely kept updated regarding patient's progress.  CSW will continue to follow and facilitate d/c to a skilled facility when medically stable.   Genelle BalVanessa Jazyah Butsch, MSW, LCSW Licensed Clinical Social Worker Clinical Social Work Department [facility] [phone]

## 2015-10-25 NOTE — Progress Notes (Addendum)
FPTS Interim Progress Note  S: Notified early evening by RN about 1800 that patient has had >3x loose mucus stools today, agreed with checking C Diff screen and reflex, given recurrent fevers and complicated hospital course on extensive antibiotics. Update with page at about 2300 by RN that both C Diff antigen and reflex toxin by PCR positive on stool sample. Reviewed chart and discussed with pharmacy. Started Vancomycin PO.  Went to check on patient following these updates, about 0050. Patient's RN at bedside, administering initial vancomycin PO. Patient without further episodes of diarrhea. Last temp low-grade 100.63F at 1729. Complains of some nausea with vomiting earlier. Received phenergan with relief. No significant abdominal pain by report. No other concerns. Answered questions on new diagnosis C Diff and likely cause with antibiotics.  O: BP 117/64 mmHg  Pulse 120  Temp(Src) 100.2 F (37.9 C) (Oral)  Resp 18  Ht 5' (1.524 m)  Wt 122 lb 6.4 oz (55.52 kg)  BMI 23.90 kg/m2  SpO2 98%  LMP 04/05/2011   Gen - resting in bed, receiving PO medications, NAD HEENT - left IJ in place without erythema, mucus mem moist Heart - Tachycardic, regular rhythm, no murmurs Lungs - CTAB anterior fields Abdomen - soft, non-distended, without significant tenderness, no masses, no rebound, incisions unchanged  A/P: Briefly, Mindi SlickerJudith Hanel is a 39 yr old female with complex PMH (CP, chronic suprapubic cath, SNF patient) and long current hospital course x 2 weeks. Essentially treated for ESBL urosepsis on multiple antibiotics including Imipenem > Ertapenem (last dose 11/17), s/p lap chole (acute choley), and had recurrent fevers nightly over past 1-2 weeks in hospital.  # C Diff infection, acute, mild-moderate, likely secondary to long course antibiotics Considered 1st episode new C Diff infection, without known history of prior C Diff (none obvious in chart review). Multiple risk factors: long course  antibiotics (vanc, zosyn, carbapenems >2 weeks), hospital stay >3 days, GI manipulation (lap choley), PPI. Clinically consistent with now diarrhea, intermittent fevers, and mild abdomen pain. Hemodynamically stable currently, recently with some hypotension within past 24 hours 11/22 around 0500 briefly. - Screen PCR positive Ag > reflex positive toxin - Enteric contact precautions - Start Vancomycin PO 500mg  q 6 hr for 14 days (increased dose therapy per pharm given long course of antibiotics, and brief hypotensive episode, although not clinically sepsis at this time). - Hold pro-biotic - Hold Protonix PPI - Complete course Fosfomycin antibiotic for presumed catheter assoc UTI x 3 doses, next dose 11/23 (discussed with pharmacy and fosfomycin is least assoc with C Diff, and already actively treating C Diff, will consider finish this after next dose tomorrow)  # Fever of unknown origin, now may consider this due to above C Diff - Treating presumed catheter UTI with Fosfomycin, no urine culture to guide therapy, next dose 11/23, last dose 11/25. - Pending LE dopplers to rule out DVT given relative immobility with CP but no clinical evidence DVT - Anticipate removing central line L-IJ on 11/23, no obvious need for IV access if improving at this time   Smitty CordsAlexander J Karamalegos, DO 10/25/2015, 1:34 AM PGY-3, [facility] Family Medicine Service pager [phone]

## 2015-10-25 NOTE — Progress Notes (Signed)
[facility]    Date of Admission:  10/08/2015   Total days of antibiotics 11        Day 4 fluconazole           ID: [name] [name] is a 39 y.o. female with  FUO Active Problems:   Major depressive disorder, recurrent episode ([name])   CP (cerebral palsy), spastic ([name])   Chronic suprapubic catheter ([name])   Recurrent pulmonary embolism ([name])   GERD (gastroesophageal reflux disease)   Urinary tract infectious disease   Shock ([name])   Pressure ulcer   Anemia due to other cause   RUQ pain   Acute cholecystitis s/p lap cholecystectomy 10/14/2015   Suprapubic tenderness   Fungus present in urine   FUO (fever of unknown origin)    Subjective: tmax to 100.7 but also had 3 bowel movements overnight  ROS: loose stools, but also she reports having nausea when she eats  Interval hx:  Testing + cdifficile.  Medications:  . baclofen  20 mg Oral QID  . clonazePAM  0.5 mg Oral 3 times per day  . feeding supplement (ENSURE ENLIVE)  237 mL Oral BID BM  . fosfomycin  3 g Oral Once  . heparin subcutaneous  5,000 Units Subcutaneous 3 times per day  . QUEtiapine  100 mg Oral QHS  . tiZANidine  4 mg Oral 2 times per day  . vancomycin  500 mg Oral 4 times per day    Objective: Vital signs in last 24 hours: Temp:  [98.3 F (36.8 C)-100.7 F (38.2 C)] 98.3 F (36.8 C) (11/23 0441) Pulse Rate:  [77-120] 77 (11/23 0441) Resp:  [17-18] 18 (11/22 2017) BP: (80-120)/(43-64) 80/43 mmHg (11/23 0441) SpO2:  [97 %-98 %] 97 % (11/23 0441) Physical Exam  Constitutional:  oriented to person, place. appears in No distress.  HENT: [place]/AT, PERRLA, no scleral icterus,  Neck: rotated to the right. Right bandage from IJ removal Mouth/Throat: Oropharynx is clear and moist. No oropharyngeal exudate.  Cardiovascular: Normal rate, regular rhythm and normal heart sounds. Exam reveals no gallop and no friction rub.  No murmur heard.  Pulmonary/Chest: Effort normal and breath  sounds normal. No respiratory distress.  has no wheezes.  Abdominal: Soft. Bowel sounds are normal.  exhibits no distension. There is no tenderness.  Neurological: contracture of extremities. Flaccid lower extremities Skin: Skin is warm and dry. No rash noted. No erythema.    Lab Results  Recent Labs  10/24/15 0554 10/25/15 0520  WBC 11.8* 7.5  HGB 9.5* 8.9*  HCT 31.1* 28.9*  NA 140 140  K 2.9* 3.9  CL 105 107  CO2 26 27  BUN 8 <5*  CREATININE 0.58 0.38*   Microbiology: 11/20 blood cx pending 11/20 ua showing pyuria, cx pending, yeast present 11/22 cdiff positive Studies/Results: cxr unrevealing  Assessment/Plan: 39yo F with cerebral palsy with suprapubic cath with recurrent ESBL ecoli uti, s/p lap chole for acute cholecystitis. FUO despite had been off of abtx for concern of drug fever, but the patient still having high daily fevers And now leukocytosis with lower abdominal discomfort, found to have c.difficile  C.difficile colitis = started on oral vancomycin high dose at  [facility] last night. This could explain recent leukocytosis, abdominal pain, and fever.(although has had daily fevers). Agree with 14 day treatment. Will see if she has response to treatment.  presumed catheter associated uti = treated with one dose of fosfomycin. No need to give unnecessary  antibiotics.  - FUO, still on going fevers = continue to monitor to see if responds to oral vancomycin  - nausea = consider to do scheduled anti-emetics.  - hypokalemia = resolved   Drue SecondSNIDER, [facility] Cell: [phone] Pager: [phone]  10/25/2015, 9:08 AM

## 2015-10-26 ENCOUNTER — Inpatient Hospital Stay (HOSPITAL_COMMUNITY): Payer: Medicare Other

## 2015-10-26 DIAGNOSIS — N898 Other specified noninflammatory disorders of vagina: Secondary | ICD-10-CM

## 2015-10-26 DIAGNOSIS — A047 Enterocolitis due to Clostridium difficile: Secondary | ICD-10-CM

## 2015-10-26 DIAGNOSIS — R6521 Severe sepsis with septic shock: Secondary | ICD-10-CM

## 2015-10-26 DIAGNOSIS — F331 Major depressive disorder, recurrent, moderate: Secondary | ICD-10-CM

## 2015-10-26 DIAGNOSIS — M7989 Other specified soft tissue disorders: Secondary | ICD-10-CM

## 2015-10-26 MED ORDER — PROMETHAZINE HCL 25 MG PO TABS
25.0000 mg | ORAL_TABLET | Freq: Three times a day (TID) | ORAL | Status: DC
  Administered 2015-10-27 – 2015-10-30 (×2): 25 mg via ORAL
  Filled 2015-10-26: qty 1

## 2015-10-26 MED ORDER — PROMETHAZINE HCL 25 MG/ML IJ SOLN
25.0000 mg | Freq: Three times a day (TID) | INTRAMUSCULAR | Status: DC
  Administered 2015-10-26 – 2015-10-30 (×12): 25 mg via INTRAVENOUS
  Filled 2015-10-26 (×13): qty 1

## 2015-10-26 MED ORDER — PROMETHAZINE HCL 25 MG RE SUPP
25.0000 mg | Freq: Three times a day (TID) | RECTAL | Status: DC
Start: 2015-10-26 — End: 2015-10-30
  Filled 2015-10-26 (×15): qty 1

## 2015-10-26 NOTE — Progress Notes (Addendum)
[facility]    Date of Admission:  10/08/2015   Total days of antibiotics 12        Day 3 oral vanco           ID: [name] [name] is a 39 y.o. female with  FUO Active Problems:   Major depressive disorder, recurrent episode ([name])   CP (cerebral palsy), spastic ([name])   Chronic suprapubic catheter ([name])   Recurrent pulmonary embolism ([name])   GERD (gastroesophageal reflux disease)   Urinary tract infectious disease   Shock ([name])   Pressure ulcer   Anemia due to other cause   RUQ pain   Acute cholecystitis s/p lap cholecystectomy 10/14/2015   Suprapubic tenderness   Fungus present in urine   FUO (fever of unknown origin)   C. difficile colitis   Fever of unknown origin    Subjective: tmax to 100.2 fever curve is trending down. Patient complains of her catheter in her vagina  ROS: more formed stool  Interval hx:  Had lower extremity dopplers that ruled out dvt  Medications:  . baclofen  20 mg Oral QID  . clonazePAM  0.5 mg Oral 3 times per day  . feeding supplement (ENSURE ENLIVE)  237 mL Oral BID BM  . heparin subcutaneous  5,000 Units Subcutaneous 3 times per day  . promethazine  25 mg Oral TID AC   Or  . promethazine  25 mg Intravenous TID AC   Or  . promethazine  25 mg Rectal TID AC  . QUEtiapine  100 mg Oral QHS  . tiZANidine  4 mg Oral 2 times per day  . vancomycin  500 mg Oral 4 times per day    Objective: Vital signs in last 24 hours: Temp:  [97.8 F (36.6 C)-99.3 F (37.4 C)] 97.8 F (36.6 C) (11/24 0534) Pulse Rate:  [71-120] 71 (11/24 0534) Resp:  [17-18] 18 (11/24 0534) BP: (104-110)/(46-72) 104/46 mmHg (11/24 0534) SpO2:  [94 %-99 %] 99 % (11/24 0534) Weight:  [121 lb 14.6 oz (55.3 kg)] 121 lb 14.6 oz (55.3 kg) (11/23 2110) Physical Exam  Constitutional:  oriented to person, place. appears in No distress. Talking to her dad on the phone HENT: [place]/AT, PERRLA, no scleral icterus,  Neck: rotated to the right. Right  bandage from IJ removal Mouth/Throat: Oropharynx is clear and moist. No oropharyngeal exudate.  Cardiovascular: Normal rate, regular rhythm and normal heart sounds. Exam reveals no gallop and no friction rub.  No murmur heard.  Pulmonary/Chest: Effort normal and breath sounds normal. No respiratory distress.  has no wheezes. gu = pannus is moist, no erythema to suprapubic catheter, debris in the mons pubis , no discharge  Abdominal: Soft. Bowel sounds are normal.  exhibits no distension. There is no tenderness.  Neurological: contracture of extremities. Flaccid lower extremities Skin: Skin is warm and dry. No rash noted. No erythema.    Lab Results  Recent Labs  10/24/15 0554 10/25/15 0520  WBC 11.8* 7.5  HGB 9.5* 8.9*  HCT 31.1* 28.9*  NA 140 140  K 2.9* 3.9  CL 105 107  CO2 26 27  BUN 8 <5*  CREATININE 0.58 0.38*   Microbiology: 11/20 blood cx pending 11/20 ua showing pyuria, cx pending, yeast present 11/22 cdiff positive Studies/Results: cxr unrevealing  Assessment/Plan: 39yo F with cerebral palsy with suprapubic cath with recurrent ESBL ecoli uti, s/p lap chole for acute cholecystitis. FUO despite had been off of abtx for concern  of drug fever, but the patient still having high daily fevers And now leukocytosis with lower abdominal discomfort, found to have c.difficile  C.difficile colitis = continue on vancomycin high dose at  QID x 14 day treatment. Appears responding to treatment.  presumed catheter associated uti = treated. No need to give unnecessary antibiotics.  - FUO, still on going fevers = appears improving. Will continue to monitor  -vaginal discomfort =asked nursing to bath patient again and clean GU area to keep dry, less irritation. Can be prone for candidiasis     Ames Hoban, [facility] Cell: [phone] Pager: [phone]  10/26/2015, 10:12 AM

## 2015-10-26 NOTE — Progress Notes (Signed)
Family Medicine Teaching Service Daily Progress Note Intern Pager: [phone]  Patient name: [name] [name] Medical record number: [id number] Date of birth: [birth year] Age: 39 y.o. Gender: female  Primary Care Provider: Luiz Blare, DO Consultants: ID, Surgery Code Status: FULL  Pt Overview and Major Events to Date:  11/6-11/7: Admitted for urosepsis; refractory to 4 litres of fluid with sbp 84 despite low dose levophed. Has been off pressors for most of 11/7. [place] cath changed as well. Started on Vanc and Imipenem  11/8: Transferred to floor from [place] 11/9: discontinued Vanc due to urine cultures showing ESBL and no growth with blood cultures  11/10: hgb 6.7; no active bleeding; transfuse 1 unit pRBC  11/11: Abx switched to Ertapenem; MRCP (unable to be obtain due to baclofen pump) 11/12: Surgery consulted, on CT abd/pelvis dx concern acute choley > to OR today for lap choley 11/17: Ertapenem stopped 11/19: Fluconazole for fungemia 11/20: Fever spike 101.47F (repeated cultures); changed suprapubic catheter due to funguria, d/c fluconazole per ID 11/21: Fosfomycin x 1 dose for presumed cystitis 11/22: Treating for C Diff infection PO Vanc 11/23: d/c IJ  Assessment and Plan:  # C. Diff Infection, likely secondary to long course of antibiotics: Diarrhea/loose mucus stools slowing down in frequency. CDiff antigen and reflex toxin by PCR positive. 4 stools over 24 hours 11/22-11/23. RF include prolonged antibiotic therapy for ESBL UTI - enteric contact precautions - Vancomycin 531m 4x/day PO (11/22>>) for 14 days - holding probiotic and PPI  #Nausea: Patient continues to be nauseated after most meals. - Schedule Phenergan 1 hour prior to meals; Zofran 882mq 8hr PRN - Obtain EKG morning of 11/25  # Fever of Unknown Origin: No fevers overnight 11/23-11/24. However, patient has been diaphoretic the past couple of days. Could be due to autonomic dysreflexia or from pain from possibly  malpositioned suprapubic catheter. Fever thought to be due to C. Diff at this time. Drug reaction fever had also been considered. No obvious source of infection noted from CXR, exam. Tachycardia to 120 once overnight. Leukoctyosis 12.4 > 11.8 > 7.5. Received two doses of fosfomycin 11/21 and 11/23 for presumed Cystitis 2/2 catheter infection; stopped because catheter replaced. Now with c. Diff infection after multiple antibiotic exposures. - 11/23: IJ removed, as it had been in since admission, and peripheral access obtained.  - 11/16: repeat blood cultures: NG x 5 days (final 11/21) - 11/18 Blood Cx IJ: NG x 5 days - 11/20 blood cultures: NG 3 days - 11/20 UA: Mod LE, neg nitrite, TNTC WBC, yeast, many bacteria - ID consulted, appreciate recs: recc stopping any excessive antibiotics, supplementing potassium, removal of IJ, rule out DVT (due to immobility from CP, although no clinical signs suggesting) - CRP 9.9, ESR 50, ANA negative  - bilateral LE dopplers ordered   #Hypotension: Improved. [id number] asymptomatic. Has been responsive to IVF boluses. - will continue to monitor closely  # Presumed catheter associated UTI, Treated: Repeat UA 11/20 mod LE, neg nitrite, TMTC WBC, many bact . Lower abdominal discomfort concerning for cystitis. Patient concerned suprapubic cath may be in the wrong position, as this has happened before with catheter coming out her urethra. Catheter appears grossly in place. - Per ID, Fosfomycin x 1 11/21 dose sufficient  - Ordered abdominal ultrasound with instructions to assess for catheter placement in bladder.   # Normocytic Anemia, presumed chronic IDA: s/p feraheme Hgb 7.0>7.4 > 6.7 (11/10) >>>9.5 > 9.5 > 8.9; on admission 9.5. No active bleeding noted  clinically. FOBT negative.  - Giving patient a break from labs today, resume daily CBCs 11/25 - 1 unit pRBC transfused 11/10; hgb stable - anemia studies: Ferritin 58; Fe 9; TIBC 241; saturation ratio  4%; folate 16.1; Vit B12 1076 (H); s/p IV Iron 11/11 and 11/15  # Hypokalemia, Resolved: 3.3 > 2.9 > (s/p IV mag 28mq x 4) 3.9. Mg 2.0 - Giving patient a break from labs today, resume daily BMETs 11/25   #Funguria- resolved: 11/16 urine culture grew >100k colonies of yeast - d/c fluconazole 11/21 after replacing suprapubic catheter 11/20 (per ID)  # ESBL E Coli Pyelonephritis w/ suprapubic catheter - Resolved. Suprapubic catheter changed 10/09/15 and again 11/20 - completed abx therapy: IV Ertapenem ( 11/11-11/17); Imipenem 11/7-11/10) - Blood culture 11/6: coagulase negative staph in one bottle (likely contaminant); second bottle NGTD x 5 day (final)  # Acute Choleycstitis s/p lap choley (11/12) - Continue pain control, anti-emetics PRN - Surgery recs: okay to dc when medically ready; surgery f/u arranged  # Transaminitis, likely in setting of severe sepsis and possible shock liver - Resolved   # Fecal Impaction: on CT in rectosigmoid colon: (Resolved). Now with C. diff colitis.  -holding home Miralax and probiotics   # Major Depression/Anxiety: - cont home Klonopin  - cont home Seroquel  # GERD: - protonix - magic mouth wash  # Stage 1 Decubitus Ulcer: erythema of skin but no skin breakdown. No tenderness.  - prophylaxis with dressing per nursing   # Hx PE with hx of anticoagulation: Per patient, [facility] was told to stop taking anticoagulation until outpatient follow up.  - [facility] chart review in care everywhere, [facility] setting of anticoagulation use (xeralto). She was taken to the OR on 09/27/15 for IVC filter placement. At the time of discharge, her Xarelto was held until her follow up appointment in 2 weeks with Dr. EAmalia Haileyand a plan will need to be made regarding anticoagulation and her IVC filter at this  appointment - Additionally per chart review, patient's hemoglobin had been unstable after initiation of systemic anticoagulation per urology - will continue to hold Xeralto  # Cerebral Palsy - Tizanidine and Baclofen   FEN/GI: normal diet PPx: SQ Heparin   Disposition: pending work up of FUO. Plan to discharge to SNF  Subjective:  - Afebrile overnight  - continues to nausea with most meals - concerned her suprapubic catheter is coming out of her urethra   Objective: Temp:  [97.8 F (36.6 C)-99.3 F (37.4 C)] 97.8 F (36.6 C) (11/24 0534) Pulse Rate:  [71-120] 71 (11/24 0534) Resp:  [17-18] 18 (11/24 0534) BP: (95-110)/(46-72) 104/46 mmHg (11/24 0534) SpO2:  [94 %-99 %] 99 % (11/24 0534) Weight:  [121 lb 14.6 oz (55.3 kg)] 121 lb 14.6 oz (55.3 kg) (11/23 2110)   Physical Exam: General: Diaphoretic, resting in bed in NAD HEENT: Neck contracted to right, gauze over site of IJ removal;  MMM Cardiovascular: RRR, no m/r/g Respiratory: CTAB bilaterally Abdomen: incisions dry and without erythema; soft, non-distended, Pump palpated in RLQ; mildly tender at RUQ GU: Catheter not visible (examined per patient request, as she has had her catheter migrate before) Extremities: warm and dry  Laboratory:  Recent Labs Lab 10/23/15 0454 10/24/15 0554 10/25/15 0520  WBC 12.4* 11.8* 7.5  HGB 9.5* 9.5* 8.9*  HCT 31.2*  31.1* 28.9*  PLT 435* 430* 369    Recent Labs Lab 10/20/15 0520  10/23/15 0454 10/24/15 0554 10/25/15 0520  NA 140  < > 138 140 140  K 3.7  < > 3.3* 2.9* 3.9  CL 105  < > 103 105 107  CO2 29  < > _0 BUN <5*  < > <5* 8 <5*  CREATININE 0.43*  < > 0.48 0.58 0.38*  CALCIUM 8.3*  < > 8.2* 8.2* 8.6*  PROT 5.7*  --   --   --   --   BILITOT 0.6  --   --   --   --   ALKPHOS 162*  --   --   --   --   ALT 14  --   --   --   --   AST 14*  --   --   --   --   GLUCOSE 105*  < > 118* 121* 99  < > = values in this interval not displayed. Anemia Panel - Iron 9,  TIBC 241, Sat ratio 4, ferritin 58 Folate 16 Vitamin B-12, 1076  Imaging/Diagnostic Tests: ECHO: EF 65-70%; normal  CXR 11/6: IMPRESSION: Negative for pneumonia.  RUQ Abd [place] (11/7) IMPRESSION: Cholelithiasis with indeterminate presence of acute cholecystitis given conflicting findings (there is gallbladder distention and mild wall thickening but no focal tenderness). HIDA scan could evaluate cystic duct patency.  CXR 11/7: IMPRESSION: 1. Left IJ central line with tip at the upper right atrium. No pneumothorax. 2. Progressive hypoventilation and perihilar atelectasis.  CT Abd / Pelvis w/ contrast 11/11 IMPRESSION: 1. Significant distension of the gallbladder, gallstones, and new gallbladder wall thickening consistent with acute cholecystitis. 2. Small bilateral pleural effusions, body wall edema. 3. Significant stool burden within the rectosigmoid colon suggests fecal impaction. 4. Suprapubic catheter unremarkable in appearance. Urinary bladder wall is thickened. 5. Smaller collections posterior to the urinary bladder compared prior study.  CXR 11/16 IMPRESSION: Limited study by poor inspiration. Cardiomediastinal silhouette is stable. Mild basilar atelectasis. No active disease.  Rogue Bussing, MD 10/26/2015, 7:28 AM PGY-1, [place] Intern pager: [phone], text pages welcome

## 2015-10-26 NOTE — Progress Notes (Signed)
Preliminary results by tech - Venous Duplex Lower Ext. Completed. Negative for deep and superficial vein thrombosis in both lower extremities. Luby Seamans, BS, RDMS, RVT  

## 2015-10-27 ENCOUNTER — Inpatient Hospital Stay (HOSPITAL_COMMUNITY): Payer: Medicare Other

## 2015-10-27 ENCOUNTER — Encounter (HOSPITAL_COMMUNITY): Payer: Self-pay | Admitting: Radiology

## 2015-10-27 LAB — CBC
HEMATOCRIT: 31.9 % — AB (ref 36.0–46.0)
HEMOGLOBIN: 9.5 g/dL — AB (ref 12.0–15.0)
MCH: 27.5 pg (ref 26.0–34.0)
MCHC: 29.8 g/dL — AB (ref 30.0–36.0)
MCV: 92.5 fL (ref 78.0–100.0)
Platelets: 387 10*3/uL (ref 150–400)
RBC: 3.45 MIL/uL — ABNORMAL LOW (ref 3.87–5.11)
RDW: 16.8 % — ABNORMAL HIGH (ref 11.5–15.5)
WBC: 6.3 10*3/uL (ref 4.0–10.5)

## 2015-10-27 LAB — BASIC METABOLIC PANEL
ANION GAP: 5 (ref 5–15)
BUN: <5 mg/dL — ABNORMAL LOW (ref 6–20)
CO2: 28 mmol/L (ref 22–32)
Calcium: 8.4 mg/dL — ABNORMAL LOW (ref 8.9–10.3)
Chloride: 105 mmol/L (ref 101–111)
Creatinine, Ser: 0.44 mg/dL (ref 0.44–1.00)
GFR calc Af Amer: >60 mL/min (ref >60)
GFR calc non Af Amer: >60 mL/min (ref >60)
GLUCOSE: 108 mg/dL — AB (ref 65–99)
POTASSIUM: 3.4 mmol/L — AB (ref 3.5–5.1)
Sodium: 138 mmol/L (ref 135–145)

## 2015-10-27 LAB — CULTURE, BLOOD (ROUTINE X 2)
Culture: NO GROWTH
Culture: NO GROWTH

## 2015-10-27 MED ORDER — IOHEXOL 300 MG/ML  SOLN
50.0000 mL | Freq: Once | INTRAMUSCULAR | Status: AC | PRN
  Administered 2015-10-27: 50 mL via INTRAVENOUS

## 2015-10-27 MED ORDER — BARIUM SULFATE 2.1 % PO SUSP
ORAL | Status: AC
  Filled 2015-10-27: qty 1

## 2015-10-27 MED ORDER — POTASSIUM CHLORIDE CRYS ER 20 MEQ PO TBCR
10.0000 meq | EXTENDED_RELEASE_TABLET | Freq: Two times a day (BID) | ORAL | Status: DC
  Administered 2015-10-27 – 2015-10-30 (×7): 10 meq via ORAL
  Filled 2015-10-27 (×6): qty 1

## 2015-10-27 MED ORDER — BARIUM SULFATE 2.1 % PO SUSP
900.0000 mL | ORAL | Status: AC
  Administered 2015-10-27 (×2): 900 mL via ORAL

## 2015-10-27 MED ORDER — HYDROCORTISONE 1 % EX CREA
TOPICAL_CREAM | Freq: Two times a day (BID) | CUTANEOUS | Status: DC
  Administered 2015-10-27 – 2015-10-30 (×6): via TOPICAL
  Filled 2015-10-27 (×2): qty 28

## 2015-10-27 MED ORDER — LORATADINE 10 MG PO TABS
10.0000 mg | ORAL_TABLET | Freq: Every day | ORAL | Status: AC
  Administered 2015-10-27 – 2015-10-28 (×2): 10 mg via ORAL
  Filled 2015-10-27 (×2): qty 1

## 2015-10-27 NOTE — Clinical Social Work Note (Signed)
CSW continuing to follow patient's progress and will facilitate [facility] when [facility].   Genelle BalVanessa Jeannemarie Sawaya, MSW, LCSW Licensed Clinical Social Worker Clinical Social Work Department [facility] [phone]

## 2015-10-27 NOTE — Progress Notes (Signed)
Family Medicine Teaching Service Daily Progress Note Intern Pager: [phone]  Patient name: [name] [name] Medical record number: [id number] Date of birth: Apr 02, 1976 Age: 39 y.o. Gender: female  Primary Care Provider: Luiz Blare, DO Consultants: ID, Surgery Code Status: FULL  Pt Overview and Major Events to Date:  11/6-11/7: Admitted for urosepsis; refractory to 4 litres of fluid with sbp 84 despite low dose levophed. Has been off pressors for most of 11/7. [place] cath changed as well. Started on Vanc and Imipenem  11/8: Transferred to floor from [place] 11/9: discontinued Vanc due to urine cultures showing ESBL and no growth with blood cultures  11/10: hgb 6.7; no active bleeding; transfuse 1 unit pRBC  11/11: Abx switched to Ertapenem; MRCP (unable to be obtain due to baclofen pump) 11/12: Surgery consulted, on CT abd/pelvis dx concern acute choley > to OR today for lap choley 11/17: Ertapenem stopped 11/19: Fluconazole for fungemia 11/20: Fever spike 101.33F (repeated cultures); changed suprapubic catheter due to funguria, d/c fluconazole per ID 11/21: Fosfomycin x 1 dose for presumed cystitis 11/22: Treating for C Diff infection PO Vanc 11/23: d/c IJ  Assessment and Plan:  # C. Diff Infection, likely secondary to long course of antibiotics: Diarrhea/loose mucus stools slowing down in frequency. CDiff antigen and reflex toxin by PCR positive. 3 stools over 24 hours 11/24-11/25. RF include prolonged antibiotic therapy for ESBL UTI - enteric contact precautions - Vancomycin 546m 4x/day PO (11/22>>) for 14 days - holding probiotic and PPI  #Nausea: Patient continues to be nauseated after most meals. - AM EKG (11/25) shows mildly prolonged QTc 496  - Schedule Phenergan 1 hour prior to meals; Zofran 833mq 8hr PRN  # Fever of Unknown Origin: Fever 101 11/24. However, patient has been diaphoretic the past couple of days. Could be due to autonomic dysreflexia or from pain from  possibly malpositioned suprapubic catheter. Fever thought to be due to C. Diff at this time. Drug reaction fever had also been considered. No obvious source of infection noted from CXR, exam. Tachycardia to 120 once overnight. Leukoctyosis 12.4 > 11.8 > 7.5. Received two doses of fosfomycin 11/21 and 11/23 for presumed Cystitis 2/2 catheter infection; stopped because catheter replaced. Now with c. Diff infection after multiple antibiotic exposures. - 11/23: IJ removed, as it had been in since admission, and peripheral access obtained.  - 11/16: repeat blood cultures: NG x 5 days (final 11/21) - 11/18 Blood Cx IJ: NG x 5 days (final 11/23) - 11/20 blood cultures: NG 4 days - 11/20 UA: Mod LE, neg nitrite, TNTC WBC, yeast, many bacteria - ID consulted, appreciate recs: continue to treat C.Diff; will monitor - CRP 9.9, ESR 50, ANA negative  - bilateral LE dopplers: negative for DVT/Superficial vein thrombosis (preliminary) - will order CT chest, abdomen, and pelvis to evaluate for occult infection   #Hypotension: Improved. 101-131/46-70. Patient asymptomatic. Has been responsive to IVF boluses. - will continue to monitor closely  # Itching: No significant rashes, only small spot of blanchable erythema on anterior right shoulder. Benadryl did not give relief  - will try Claritin  - Hydrocortisone 1% cream to affected areas BID - possibly switch to cotton gown  # Presumed catheter associated UTI, Treated: Repeat UA 11/20 mod LE, neg nitrite, TMTC WBC, many bact . Lower abdominal discomfort concerning for cystitis. Patient concerned suprapubic cath may be in the wrong position, as this has happened before with catheter coming out her urethra. Catheter appears grossly in place. - Per ID,  Fosfomycin x 1 11/21 dose sufficient  - US Pelvis 11/24: Foley catheter in place  # Normocytic Anemia, presumed chronic IDA: s/p feraheme Hgb 7.0>7.4 > 6.7 (11/10) >>>9.5; on admission 9.5. No active bleeding noted  clinically. FOBT negative.  - Giving patient a break from labs today, resume daily CBCs 11/25 - 1 unit pRBC transfused 11/10; hgb stable - anemia studies: Ferritin 58; Fe 9; TIBC 241; saturation ratio 4%; folate 16.1; Vit B12 1076 (H); s/p IV Iron 11/11 and 11/15  # Hypokalemia: 3.3 > 2.9 > (s/p IV mag 24mq x 4) 3.9 > 3.4. Likely due to diarrhea.  Mg 2.0 - Kdur 131m BID x 1 today   #Funguria- resolved: 11/16 urine culture grew >100k colonies of yeast - d/c fluconazole 11/21 after replacing suprapubic catheter 11/20 (per ID)  # ESBL E Coli Pyelonephritis w/ suprapubic catheter - Resolved. Suprapubic catheter changed 10/09/15 and again 11/20 - completed abx therapy: IV Ertapenem ( 11/11-11/17); Imipenem 11/7-11/10) - Blood culture 11/6: coagulase negative staph in one bottle (likely contaminant); second bottle NGTD x 5 day (final)  # Acute Choleycstitis s/p lap choley (11/12) - Continue pain control, anti-emetics PRN - Surgery recs: okay to dc when medically ready; surgery f/u arranged  # Transaminitis, likely in setting of severe sepsis and possible shock liver - Resolved   # Fecal Impaction: on CT in rectosigmoid colon: (Resolved). Now with C. diff colitis.  -holding home Miralax and probiotics   # Major Depression/Anxiety: - cont home Klonopin  - cont home Seroquel  # GERD: - protonix - magic mouth wash  # Stage 1 Decubitus Ulcer: erythema of skin but no skin breakdown. No tenderness.  - prophylaxis with dressing per nursing   # Hx PE with hx of anticoagulation: Per patient, [facility] was told to stop taking anticoagulation until outpatient follow up.  - per chart review in care everywhere, [facility] in the setting of anticoagulation use (xeralto). She was taken to the OR on 09/27/15 for IVC filter placement. At the time of  discharge, her Xarelto was held until her follow up appointment in 2 weeks with Dr. EvAmalia Haileynd a plan will need to be made regarding anticoagulation and her IVC filter at this appointment - Additionally per chart review, patient's hemoglobin had been unstable after initiation of systemic anticoagulation per urology - will continue to hold Xeralto  # Cerebral Palsy - Tizanidine and Baclofen   FEN/GI: normal diet, Ensure PPx: SQ Heparin   Disposition: pending work up of FUO. Plan to discharge to SNF  Subjective:  - noted to have fever of 101 overnight  - notes of nausea after she ate this morning. She did not receive any medications prior to eating as planned - notes of itching on her chest and neck for the past few days. States benadryl helped a little but would like something else  - denies any abdominal pain, - states that she does not want to go to the nursing facility over the weekend.   Objective: Temp:  [97.2 F (36.2 C)-101 F (38.3 C)] 97.7 F (36.5 C) (11/25 0438) Pulse Rate:  [65-114] 65 (11/25 0438) Resp:  [17-20] 17 (11/25 0438) BP: (101-131)/(50-70) 131/57 mmHg (11/25 0438) SpO2:  [93 %-99 %] 93 % (11/25 0438) Weight:  [123 lb (55.792 kg)] 123 lb (55.792 kg) (11/24 2006)   Physical Exam:  General: Diaphoretic, resting in bed in NAD HEENT: Neck contracted to right, gauze over site of IJ removal;  MMM Cardiovascular: RRR, no m/r/g  Respiratory: CTAB bilaterally  Abdomen: incisions dry and without erythema; soft, non-distended, Pump palpated in RLQ; mildly tender at RUQ  Extremities: warm and dry Skin: mild blanchable erythematous area right anterior shoulder but otherwise no rashes.   Laboratory:  Recent Labs Lab 10/24/15 0554 10/25/15 0520 10/27/15 0345  WBC 11.8* 7.5 6.3  HGB 9.5* 8.9* 9.5*  HCT 31.1* 28.9* 31.9*  PLT 430* 369 387    Recent Labs Lab 10/24/15 0554 10/25/15 0520 10/27/15 0345  NA 140 140 138  K 2.9* 3.9 3.4*  CL 105 107 105  CO2 '26  27 28  ' BUN 8 <5* <5*  CREATININE 0.58 0.38* 0.44  CALCIUM 8.2* 8.6* 8.4*  GLUCOSE 121* 99 108*   Anemia Panel - Iron 9, TIBC 241, Sat ratio 4, ferritin 58 Folate 16 Vitamin B-12, 1076  Imaging/Diagnostic Tests: ECHO: EF 65-70%; normal  CXR 11/6: IMPRESSION: Negative for pneumonia.  RUQ Abd [place] (11/7) IMPRESSION: Cholelithiasis with indeterminate presence of acute cholecystitis given conflicting findings (there is gallbladder distention and mild wall thickening but no focal tenderness). HIDA scan could evaluate cystic duct patency.  CXR 11/7: IMPRESSION: 1. Left IJ central line with tip at the upper right atrium. No pneumothorax. 2. Progressive hypoventilation and perihilar atelectasis.  CT Abd / Pelvis w/ contrast 11/11 IMPRESSION: 1. Significant distension of the gallbladder, gallstones, and new gallbladder wall thickening consistent with acute cholecystitis. 2. Small bilateral pleural effusions, body wall edema. 3. Significant stool burden within the rectosigmoid colon suggests fecal impaction. 4. Suprapubic catheter unremarkable in appearance. Urinary bladder wall is thickened. 5. Smaller collections posterior to the urinary bladder compared prior study.  CXR 11/16 IMPRESSION: Limited study by poor inspiration. Cardiomediastinal silhouette is stable. Mild basilar atelectasis. No active disease.  Smiley Houseman, MD 10/27/2015, 6:55 AM PGY-1, [place] Intern pager: [phone], text pages welcome

## 2015-10-28 LAB — CBC
HEMATOCRIT: 31.6 % — AB (ref 36.0–46.0)
Hemoglobin: 9.5 g/dL — ABNORMAL LOW (ref 12.0–15.0)
MCH: 27.8 pg (ref 26.0–34.0)
MCHC: 30.1 g/dL (ref 30.0–36.0)
MCV: 92.4 fL (ref 78.0–100.0)
PLATELETS: 389 10*3/uL (ref 150–400)
RBC: 3.42 MIL/uL — ABNORMAL LOW (ref 3.87–5.11)
RDW: 16.7 % — AB (ref 11.5–15.5)
WBC: 6.2 10*3/uL (ref 4.0–10.5)

## 2015-10-28 LAB — BASIC METABOLIC PANEL
ANION GAP: 5 (ref 5–15)
BUN: <5 mg/dL — ABNORMAL LOW (ref 6–20)
CALCIUM: 8.7 mg/dL — AB (ref 8.9–10.3)
CO2: 30 mmol/L (ref 22–32)
Chloride: 102 mmol/L (ref 101–111)
Creatinine, Ser: 0.42 mg/dL — ABNORMAL LOW (ref 0.44–1.00)
Glucose, Bld: 117 mg/dL — ABNORMAL HIGH (ref 65–99)
Potassium: 4.5 mmol/L (ref 3.5–5.1)
SODIUM: 137 mmol/L (ref 135–145)

## 2015-10-28 MED ORDER — VANCOMYCIN 50 MG/ML ORAL SOLUTION
125.0000 mg | Freq: Four times a day (QID) | ORAL | Status: DC
  Administered 2015-10-28 – 2015-10-30 (×7): 125 mg via ORAL
  Filled 2015-10-28 (×12): qty 2.5

## 2015-10-28 NOTE — Progress Notes (Signed)
Patient complained of suprapubic catheter leaking and urinating through her vagina. Patient also complained of sharp pain in her vagina. Oncall MD notified and advised to call rehab RN to come check for placement of suprapubic catheter. Rehab RN called and will come assess patient. Pain medication administered. RN will continue to monitor patient.   Veatrice KellsMahmoud,Ethridge Sollenberger I, RN

## 2015-10-28 NOTE — Progress Notes (Signed)
[facility]    Date of Admission:  10/08/2015   Total days of antibiotics 14        Day 5 oral vanco           ID: [name] SlickerJudith [name] is a 39 y.o. female with  FUO Active Problems:   Major depressive disorder, recurrent episode ([name])   CP (cerebral palsy), spastic ([name])   Chronic suprapubic catheter ([name])   Recurrent pulmonary embolism ([name])   GERD (gastroesophageal reflux disease)   Urinary tract infectious disease   Shock ([name])   Pressure ulcer   Anemia due to other cause   RUQ pain   Acute cholecystitis s/p lap cholecystectomy 10/14/2015   Suprapubic tenderness   Fungus present in urine   FUO (fever of unknown origin)   C. difficile colitis   Fever of unknown origin    Subjective:  Patient complained last night of sensation of urine leak. tmax of 100.3 yesterday, down from 101 from the day prior  ROS: more formed stool  Interval hx:  Underwent chest Ct and abd/pelvis CT as part of FUO work up  Medications:  . baclofen  20 mg Oral QID  . clonazePAM  0.5 mg Oral 3 times per day  . feeding supplement (ENSURE ENLIVE)  237 mL Oral BID BM  . heparin subcutaneous  5,000 Units Subcutaneous 3 times per day  . hydrocortisone cream   Topical BID  . loratadine  10 mg Oral Daily  . potassium chloride  10 mEq Oral BID  . promethazine  25 mg Oral TID AC   Or  . promethazine  25 mg Intravenous TID AC   Or  . promethazine  25 mg Rectal TID AC  . QUEtiapine  100 mg Oral QHS  . tiZANidine  4 mg Oral 2 times per day  . vancomycin  500 mg Oral 4 times per day    Objective: Vital signs in last 24 hours: Temp:  [98.1 F (36.7 C)-100.3 F (37.9 C)] 98.1 F (36.7 C) (11/26 0913) Pulse Rate:  [85-111] 87 (11/26 0913) Resp:  [16-20] 20 (11/26 0913) BP: (126-159)/(59-88) 126/67 mmHg (11/26 0913) SpO2:  [95 %-98 %] 96 % (11/26 0913) Weight:  [120 lb (54.432 kg)] 120 lb (54.432 kg) (11/25 2309) Physical Exam  Constitutional:  oriented to person, place.  appears in No distress. Talking to her dad on the phone HENT: [place]/AT, PERRLA, no scleral icterus,  Neck: rotated to the right. Right bandage from IJ removal Mouth/Throat: Oropharynx is clear and moist. No oropharyngeal exudate.  Cardiovascular: Normal rate, regular rhythm and normal heart sounds. Exam reveals no gallop and no friction rub.  No murmur heard.  Pulmonary/Chest: Effort normal and breath sounds normal. No respiratory distress.  has no wheezes. gu = pannus is moist, no erythema to suprapubic catheter, debris in the mons pubis , no discharge  Abdominal: Soft. Bowel sounds are normal.  exhibits no distension. There is no tenderness.  Neurological: contracture of extremities. Flaccid lower extremities Skin: Skin is warm and dry. No rash noted. No erythema.    Lab Results  Recent Labs  10/27/15 0345 10/28/15 0615  WBC 6.3 6.2  HGB 9.5* 9.5*  HCT 31.9* 31.6*  NA 138 137  K 3.4* 4.5  CL 105 102  CO2 28 30  BUN <5* <5*  CREATININE 0.44 0.42*   Microbiology: 11/20 blood cx pending 11/20 ua showing pyuria, cx pending, yeast present 11/22 cdiff positive Studies/Results: CT abd/pelvis  showing fluid collection at GB fossa   Assessment/Plan: 39yo F with cerebral palsy with suprapubic cath with recurrent ESBL ecoli uti, s/p lap chole for acute cholecystitis, c.diff  And still now cause identified for FUO   C.difficile colitis = currently on day 5 of 10, will back down to standard dosing of  QID since she has been stable.  Appears responding to treatment.  presumed catheter associated uti = treated. No need to give unnecessary antibiotics.  - FUO, still on going fevers = underwent chest/abd/pelvis CT which showed fluid collection at gallbladder wall fossa. Imaging is difficult to see if it appears like abscess vs. Seroma. May be worth having general surgery review to see if need fluid collection drained/aspirated. Review of her medications do not suggest that she has  offending agents to cause fever. i don't want to assume it is an abscess since we are treating her for c.difficile presently and want to minimize unnecessary abtx exposure.  -vaginal discomfort = improved with suprapubic catheter manipulation     [name] [name], [facility] Cell: [phone] Pager: [phone]  10/28/2015, 10:01 AM

## 2015-10-28 NOTE — Progress Notes (Signed)
Primary RN called this RN to assess suprapubic catheter, reported to be leaking at site and through vagina but still draining into bag.  This RN and another Oncologistehab RN assessed patient and noted little leakage at site, damp bed pad, and 400mL in drainage bag. Pt stated "It's coming through my vagina, it feels exactly like last time." RN's noted stitches at vagina, no visible tube or abnormal opening. 5mL found in balloon, reinserted 10mL.  Pt stated "It feels a little better."  Site cleaned, and split gauze applied. Primary RN notified MD of findings and adjustments.  Primary RN to continue to monitor. This RN advised to follow up with MD if no relief or if leakage continues.

## 2015-10-28 NOTE — Progress Notes (Signed)
OT Cancellation Note  Patient Details Name: [name] SlickerJudith Naab MRN: [id number] DOB: 1976-09-01   Cancelled Treatment:    Reason Eval/Treat Not Completed: OT screened, no needs identified, will sign off. According to PT note, patient total [facility] Pt used lift for OOB transfers. At this time, no OT needs identified.Please send text page to OT services if any questions, concerns, or with new orders: [phone] OR call office at [phone]. Thank you for the order.    Mardene Lessig , MS, OTR/L, CLT Pager: [phone]  10/28/2015, 7:43 AM

## 2015-10-28 NOTE — Progress Notes (Signed)
Family Medicine Teaching Service Daily Progress Note Intern Pager: [phone]  Patient name: [name] [name] Medical record number: [id number] Date of birth: August 13, 1976 Age: 39 y.o. Gender: female  Primary Care Provider: Luiz Blare, DO Consultants: ID, Surgery Code Status: FULL  Pt Overview and Major Events to Date:  11/6-11/7: Admitted for urosepsis; refractory to 4 litres of fluid with sbp 84 despite low dose levophed. Has been off pressors for most of 11/7. [place] cath changed as well. Started on Vanc and Imipenem  11/8: Transferred to floor from [place] 11/9: discontinued Vanc due to urine cultures showing ESBL and no growth with blood cultures  11/10: hgb 6.7; no active bleeding; transfuse 1 unit pRBC  11/11: Abx switched to Ertapenem; MRCP (unable to be obtain due to baclofen pump) 11/12: Surgery consulted, on CT abd/pelvis dx concern acute choley > to OR today for lap choley 11/17: Ertapenem stopped 11/19: Fluconazole for fungemia 11/20: Fever spike 101.69F (repeated cultures); changed suprapubic catheter due to funguria, d/c fluconazole per ID 11/21: Fosfomycin x 1 dose for presumed cystitis 11/22: Treating for C Diff infection PO Vanc 11/23: d/c IJ  Assessment and Plan:  # C. Diff Infection, likely secondary to long course of antibiotics: Diarrhea/loose mucus stools slowing down in frequency. CDiff antigen and reflex toxin by PCR positive. 3 stools over 24 hours 11/24-11/25. RF include prolonged antibiotic therapy for ESBL UTI - enteric contact precautions - Vancomycin $RemoveBefo'500mg'URTfAdaXwcQ$  4x/day PO (11/22>>) for 14 days - holding probiotic and PPI  #Nausea: Patient continues to be nauseated after most meals. - AM EKG (11/25) shows mildly prolonged QTc 496  - Schedule Phenergan 1 hour prior to meals; Zofran $RemoveBefor'8mg'[id number]$  q 8hr PRN - Patient asking for prescription for phenergan when [facility]  # Fever of Unknown Origin: Fever 101 11/24.  Could be due to autonomic dysreflexia or from pain from possibly  malpositioned suprapubic catheter. Fever thought to be due to C. Diff at this time. Drug reaction fever had also been considered. No obvious source of infection noted from CXR, exam. Leukoctyosis resolved. Received two doses of fosfomycin 11/21 and 11/23 for presumed Cystitis 2/2 catheter infection; stopped because catheter replaced. Now with c. Diff infection after multiple antibiotic exposures. - 11/23: IJ removed, as it had been in since admission, and peripheral access obtained.  - 11/16: repeat blood cultures: NG x 5 days (final 11/21) - 11/18 Blood Cx IJ: NG x 5 days (final 11/23) - 11/20 blood cultures: NG - 11/20 UA: Mod LE, neg nitrite, TNTC WBC, yeast, many bacteria - 11/25 Blood Cx: pending - ID consulted, appreciate recs: continue to treat C.Diff; will monitor - CRP 9.9, ESR 50, ANA negative  - bilateral LE dopplers: negative for DVT/Superficial vein thrombosis  - CT chest, abdomen, and pelvis to evaluate for occult infection >> negative  #Hypotension: Resolved; Patient asymptomatic. Has been responsive to IVF boluses. - will continue to monitor  # Itching: No significant rashes, only small spot of blanchable erythema on anterior right shoulder. Benadryl did not give relief  - will try Claritin  - Hydrocortisone 1% cream to affected areas BID - possibly switch to cotton gown  # Presumed catheter associated UTI, Treated: Repeat UA 11/20 mod LE, neg nitrite, TMTC WBC, many bact . Lower abdominal discomfort concerning for cystitis. Catheter appears grossly in place. - Per ID, Fosfomycin x 1 11/21 dose sufficient  - US Pelvis 11/24: Foley catheter in place  # Normocytic Anemia, presumed chronic IDA: s/p feraheme Hgb 7.0>7.4 > 6.7 (11/10) >>>9.5;  on admission 9.5. No active bleeding noted clinically. FOBT negative.  - 1 unit pRBC transfused 11/10; hgb stable - anemia studies: Ferritin 58; Fe 9; TIBC 241; saturation ratio 4%; folate 16.1; Vit B12 1076 (H); s/p IV Iron 11/11 and  11/15 - Will monitor  # Hypokalemia:  Likely due to diarrhea.  Mg 2.0 - s/p Kdur 55mEq BID x 1 - K+ 4.5; will monitor  #Funguria- resolved: 11/16 urine culture grew >100k colonies of yeast - d/c fluconazole 11/21 after replacing suprapubic catheter 11/20 (per ID)  # ESBL E Coli Pyelonephritis w/ suprapubic catheter - Resolved. Suprapubic catheter changed 10/09/15 and again 11/20 - completed abx therapy: IV Ertapenem ( 11/11-11/17); Imipenem 11/7-11/10) - Blood culture 11/6: coagulase negative staph in one bottle (likely contaminant); second bottle NGTD x 5 day (final)  # Acute Choleycstitis s/p lap choley (11/12) - Continue pain control, anti-emetics PRN - Surgery recs: okay to dc when medically ready; surgery f/u arranged  # Transaminitis, likely in setting of severe sepsis and possible shock liver - Resolved   # Fecal Impaction: on CT in rectosigmoid colon: (Resolved). Now with C. diff colitis.  -holding home Miralax and probiotics   # Major Depression/Anxiety: - cont home Klonopin  - cont home Seroquel  # GERD: - protonix - magic mouth wash  # Stage 1 Decubitus Ulcer: erythema of skin but no skin breakdown. No tenderness.  - prophylaxis with dressing per nursing   # Hx PE with hx of anticoagulation: Per patient, had recent vaginal bleeding which was [facility] at [facility] this month; was told to stop taking anticoagulation until outpatient follow up.  - per chart review in care everywhere, patient was admitted to [facility] for vaginal bleeding and acute symptomatic anemia in Oct 2016 in the setting of anticoagulation use (xeralto). She was taken to the OR on 09/27/15 for IVC filter placement. At the time of discharge, her Xarelto was held until her follow up appointment in 2 weeks with Dr. Amalia Hailey and a plan will need to be made regarding anticoagulation and her IVC filter at this appointment - Additionally per chart review, patient's hemoglobin had been unstable after  initiation of systemic anticoagulation per urology - will continue to hold Xeralto  # Cerebral Palsy - Tizanidine and Baclofen   FEN/GI: normal diet, Ensure PPx: SQ Heparin   Disposition: pending work up of FUO. Plan to discharge to SNF when available  Subjective:  Doing well this AM. Patient states that she does not want to go to the nursing facility over the weekend. No other concerns.  Objective: Temp:  [98.1 F (36.7 C)-100.3 F (37.9 C)] 98.1 F (36.7 C) (11/26 0913) Pulse Rate:  [85-111] 87 (11/26 0913) Resp:  [16-20] 20 (11/26 0913) BP: (126-159)/(59-88) 126/67 mmHg (11/26 0913) SpO2:  [95 %-98 %] 96 % (11/26 0913) Weight:  [120 lb (54.432 kg)] 120 lb (54.432 kg) (11/25 2309)   Physical Exam: General: resting in bed in NAD HEENT: Neck contracted to right, gauze over site of IJ removal;  MMM Cardiovascular: RRR, no m/r/g  Respiratory: CTAB bilaterally  Abdomen: incisions dry and without erythema; soft, non-distended, Pump palpated in RLQ; mildly tender at RUQ  Extremities: warm and dry  Laboratory:  Recent Labs Lab 10/25/15 0520 10/27/15 0345 10/28/15 0615  WBC 7.5 6.3 6.2  HGB 8.9* 9.5* 9.5*  HCT 28.9* 31.9* 31.6*  PLT 369 387 389    Recent Labs Lab 10/25/15 0520 10/27/15 0345 10/28/15 0615  NA 140 138 137  K 3.9 3.4* 4.5  CL 107 105 102  CO2 _0 BUN <5* <5* <5*  CREATININE 0.38* 0.44 0.42*  CALCIUM 8.6* 8.4* 8.7*  GLUCOSE 99 108* 117*   Anemia Panel - Iron 9, TIBC 241, Sat ratio 4, ferritin 58 Folate 16 Vitamin B-12, 1076  Imaging/Diagnostic Tests: ECHO: EF 65-70%; normal  CXR 11/6: IMPRESSION: Negative for pneumonia.  RUQ Abd [place] (11/7) IMPRESSION: Cholelithiasis with indeterminate presence of acute cholecystitis given conflicting findings (there is gallbladder distention and mild wall thickening but no focal tenderness). HIDA scan could evaluate cystic duct patency.  CXR 11/7: IMPRESSION: 1. Left IJ central line with tip  at the upper right atrium. No pneumothorax. 2. Progressive hypoventilation and perihilar atelectasis.  CT Abd / Pelvis w/ contrast 11/11 IMPRESSION: 1. Significant distension of the gallbladder, gallstones, and new gallbladder wall thickening consistent with acute cholecystitis. 2. Small bilateral pleural effusions, body wall edema. 3. Significant stool burden within the rectosigmoid colon suggests fecal impaction. 4. Suprapubic catheter unremarkable in appearance. Urinary bladder wall is thickened. 5. Smaller collections posterior to the urinary bladder compared prior study.  CXR 11/16 IMPRESSION: Limited study by poor inspiration. Cardiomediastinal silhouette is stable. Mild basilar atelectasis. No active disease.  CT Chest/Abdomen/Pelvis 11/25 IMPRESSION: Limited study due to suboptimal contrast opacification. Mild subsegmental atelectasis in both lungs. Postoperative fluid collection gallbladder fossa of unknown sterility. Suprapubic catheter in the bladder with diffuse bladder wall thickening. 4 x 1.5 cm fluid collection posterior to the bladder similar to prior study.  Elberta Leatherwood, MD 10/28/2015, 10:53 AM PGY-2, [place] Intern pager: [phone], text pages welcome

## 2015-10-29 DIAGNOSIS — K812 Acute cholecystitis with chronic cholecystitis: Secondary | ICD-10-CM

## 2015-10-29 LAB — CBC
HCT: 34.1 % — ABNORMAL LOW (ref 36.0–46.0)
Hemoglobin: 10.5 g/dL — ABNORMAL LOW (ref 12.0–15.0)
MCH: 28.5 pg (ref 26.0–34.0)
MCHC: 30.8 g/dL (ref 30.0–36.0)
MCV: 92.4 fL (ref 78.0–100.0)
Platelets: 342 10*3/uL (ref 150–400)
RBC: 3.69 MIL/uL — ABNORMAL LOW (ref 3.87–5.11)
RDW: 17.1 % — AB (ref 11.5–15.5)
WBC: 6.3 10*3/uL (ref 4.0–10.5)

## 2015-10-29 LAB — BASIC METABOLIC PANEL
Anion gap: 7 (ref 5–15)
CALCIUM: 8.8 mg/dL — AB (ref 8.9–10.3)
CO2: 29 mmol/L (ref 22–32)
CREATININE: 0.46 mg/dL (ref 0.44–1.00)
Chloride: 104 mmol/L (ref 101–111)
GFR calc non Af Amer: >60 mL/min (ref >60)
Glucose, Bld: 100 mg/dL — ABNORMAL HIGH (ref 65–99)
Potassium: 3.7 mmol/L (ref 3.5–5.1)
SODIUM: 140 mmol/L (ref 135–145)

## 2015-10-29 MED ORDER — SODIUM CHLORIDE 0.9 % IV BOLUS (SEPSIS)
500.0000 mL | Freq: Once | INTRAVENOUS | Status: AC
  Administered 2015-10-29: 500 mL via INTRAVENOUS

## 2015-10-29 NOTE — Progress Notes (Signed)
Per MD note- tentative d/c to SNF tomorrow if medically stable.  [facility]  Lorri Frederickonna T. Jaci LazierCrowder, [place]CS[place] [phone] (weekend coverage)

## 2015-10-29 NOTE — Progress Notes (Signed)
Family Medicine Teaching Service Daily Progress Note Intern Pager: [phone]  Patient name: [name] [name] Medical record number: [id number] Date of birth: 1976/08/08 Age: 39 y.o. Gender: female  Primary Care Provider: Luiz Blare, DO Consultants: ID, Surgery Code Status: FULL  Pt Overview and Major Events to Date:  11/6-11/7: Admitted for urosepsis; refractory to 4 litres of fluid with sbp 84 despite low dose levophed. Has been off pressors for most of 11/7. [place] cath changed as well. Started on Vanc and Imipenem  11/8: Transferred to floor from [facility] 11/9: discontinued Vanc due to urine cultures showing ESBL and no growth with blood cultures  11/10: hgb 6.7; no active bleeding; transfuse 1 unit pRBC  11/11: Abx switched to Ertapenem; MRCP (unable to be obtain due to baclofen pump) 11/12: Surgery consulted, on CT abd/pelvis dx concern acute choley > to OR today for lap choley 11/17: Ertapenem stopped 11/19: Fluconazole for fungemia 11/20: Fever spike 101.575F (repeated cultures); changed suprapubic catheter due to funguria, d/c fluconazole per ID 11/21: Fosfomycin x 1 dose for presumed cystitis 11/22: Treating for C Diff infection PO Vanc 11/23: d/c IJ 11/25: Low-grade temp 100.75F 6pm. CT Chest, Abd/Pelvis > post-op fluid changes in GB wall. 11/27: Afebrile x 48 hours, continue Vanc PO for C Diff  Assessment and Plan:  # C. Diff Infection, likely secondary to long course of antibiotics - Improving Significant risk factors with prolonged antibiotic therapy for ESBL UTI. Confirmed dx with C Diff antigen and toxin Positive. Diarrhea improving. Abdominal pain resolved. - Enteric contact precautions - Vancomycin 542m 4x/day PO (11/22>>last day 11/06/15) for 14 days  - holding probiotic and PPI  # Nausea, without vomiting - persistent with mild improvement Last QTc on AM EKG 496 (11/25) - Continue scheduled Phenergan 1 hour prior to meals TID; Zofran 853mq 8hr PRN - Anticipate  continue anti-emetics, phenergan on DC for PRN  # Fever of Unknown Origin, suspected to be related to C Diff Last low-grade temp 100.75F at 1800 (11/25). Could be due to autonomic dysreflexia or from pain from possibly malpositioned suprapubic catheter. Fever thought to be due to C. Diff at this time. Drug reaction fever had also been considered. No obvious source of infection noted from CXR, exam. Leukoctyosis resolved. Received two doses of fosfomycin 11/21 and 11/23 for presumed Cystitis 2/2 catheter infection; stopped because catheter replaced. Now with c. Diff infection after multiple antibiotic exposures. - 11/23: IJ removed, as it had been in since admission, and peripheral access obtained.  - 11/16: repeat blood cultures: NG x 5 days (final 11/21) - 11/18 Blood Cx IJ: NG x 5 days (final 11/23) - 11/20 blood cultures: NG - 11/20 UA: Mod LE, neg nitrite, TNTC WBC, yeast, many bacteria - 11/25 Blood Cx: NGTD x 2 days - ID consulted, appreciate recs: continue to treat C.Diff; will monitor - CRP 9.9, ESR 50, ANA negative  - bilateral LE dopplers: negative for DVT/Superficial vein thrombosis  - CT chest, abdomen, and pelvis to evaluate for occult infection >> negative for obvious sign of infection. Noted small post-operative fluid in GB wall, discussed with General Surgery today 11/27, reviewed images and advised that this would not warrant further surgical intervention. Of note, patient has soft abdomen and non-tender.  # Hypotension: Resolved Patient asymptomatic, normal mentation. Has been responsive to IVF boluses. - will continue to monitor  # Itching: No significant rashes, only small spot of blanchable erythema on anterior right shoulder. Benadryl did not give relief - will try Claritin -  Hydrocortisone 1% cream to affected areas BID - Switch to cotton gown today  # Presumed catheter associated UTI, Treated: Repeat UA 11/20 mod LE, neg nitrite, TMTC WBC, many bact . Lower abdominal  discomfort concerning for cystitis. Catheter appears grossly in place. - Per ID, Fosfomycin x 1 11/21 dose sufficient - US Pelvis 11/24: Foley catheter in place  # Normocytic Anemia, presumed chronic IDA: s/p feraheme S/p 1u PRBC 10/12/15 with Hgb to 6.7. No active bleeding noted clinically. FOBT negative.  - Current Hgb trend stable - anemia studies: Ferritin 58; Fe 9; TIBC 241; saturation ratio 4%; folate 16.1; Vit B12 1076 (H); s/p IV Iron 11/11 and 11/15 - Will monitor  # Hypokalemia - Resolved: Likely due to diarrhea.  Mg 2.0 (11/22) - Continue Kdur 29mq BID - Follow BMET  #Funguria- resolved: 11/16 urine culture grew >100k colonies of yeast - d/c fluconazole 11/21 after replacing suprapubic catheter 11/20 (per ID)  # ESBL E Coli Pyelonephritis w/ suprapubic catheter - Resolved. Suprapubic catheter changed 10/09/15 and again 11/20 - completed abx therapy: IV Ertapenem ( 11/11-11/17); Imipenem 11/7-11/10) - Blood culture 11/6: coagulase negative staph in one bottle (likely contaminant); second bottle NGTD x 5 day (final) - Repeat BCx NGTD  # Acute Choleycstitis s/p lap choley (11/12) - Resolved - Continue pain control, anti-emetics PRN - Post-operative fluid around GB on repeat CT 11/25 - Discussed course with Gen Surgery again today 11/27, recommend no further surgical intervention. No sign of abscess or re-infection. Recommend outpatient follow-up. Clear to discharge from surgery stand point.  # Transaminitis, likely in setting of severe sepsis and possible shock liver - Resolved   # Fecal Impaction: on CT in rectosigmoid colon: (Resolved). Now with C. diff colitis.  -holding home Miralax and probiotics   # Major Depression/Anxiety: - cont home Klonopin  - cont home Seroquel  # GERD: - protonix - magic mouth wash  # Stage 1 Decubitus Ulcer: erythema of skin but no skin breakdown. No tenderness.  - prophylaxis with dressing per nursing   # Hx PE with hx of  anticoagulation: Per patient, [facility] was told to stop taking anticoagulation until outpatient follow up.  - per chart review in care everywhere, [facility] in the setting of anticoagulation use (xeralto). She was taken to the OR on 09/27/15 for IVC filter placement. At the time of discharge, her Xarelto was held until her follow up appointment in 2 weeks with Dr. EAmalia Haileyand a plan will need to be made regarding anticoagulation and her IVC filter at this appointment - Additionally per chart review, patient's hemoglobin had been unstable after initiation of systemic anticoagulation per urology - will continue to hold Xeralto  # Cerebral Palsy - Tizanidine and Baclofen   FEN/GI: normal diet, Ensure PPx: SQ Heparin   Disposition: Treatingn for C Diff with vanco PO as likely source of prior fever of unknown origin, afebrile x 48 hours. Considered medically stable for discharge to SNF once arrangements made, anticipate DC tomorrow 11/28 to SNF.  Subjective:  No new concerns today. Just had bath per nursing. Complains of some nausea without vomiting. No more abdominal pain. Still some loose stools. No fevers  Objective: Temp:  [97.7 F (36.5 C)-99 F (37.2 C)] 97.8 F (36.6 C) (11/27 0758) Pulse Rate:  [71-107] 71 (11/27 0758) Resp:  [18-20] 18 (11/27 0758) BP: (  89-134)/(43-72) 102/50 mmHg (11/27 0758) SpO2:  [95 %-97 %] 97 % (11/27 0758) Weight:  [121 lb (54.885 kg)] 121 lb (54.885 kg) (11/26 1936)   Physical Exam: General: resting in bed, comfortable, cooperative, NAD HEENT: Neck contracted to right, gauze over site of IJ removal, clear dry intact;  MMM Cardiovascular: RRR, no m/r/g  Respiratory: CTAB bilaterally  Abdomen: soft, non-tender, improved without significant tenderness in RUQ, non-distended. Lap Incisions dry and without  erythema, Pump palpated in RLQ. Extremities: warm and dry, calves non-tender  Laboratory:  Recent Labs Lab 10/27/15 0345 10/28/15 0615 10/29/15 0326  WBC 6.3 6.2 6.3  HGB 9.5* 9.5* 10.5*  HCT 31.9* 31.6* 34.1*  PLT 387 389 342    Recent Labs Lab 10/27/15 0345 10/28/15 0615 10/29/15 0326  NA 138 137 140  K 3.4* 4.5 3.7  CL 105 102 104  CO2 '28 30 29  ' BUN <5* <5* <5*  CREATININE 0.44 0.42* 0.46  CALCIUM 8.4* 8.7* 8.8*  GLUCOSE 108* 117* 100*   Anemia Panel - Iron 9, TIBC 241, Sat ratio 4, ferritin 58 Folate 16 Vitamin B-12, 1076  Imaging/Diagnostic Tests: ECHO: EF 65-70%; normal  CXR 11/6: IMPRESSION: Negative for pneumonia.  RUQ Abd [place] (11/7) IMPRESSION: Cholelithiasis with indeterminate presence of acute cholecystitis given conflicting findings (there is gallbladder distention and mild wall thickening but no focal tenderness). HIDA scan could evaluate cystic duct patency.  CXR 11/7: IMPRESSION: 1. Left IJ central line with tip at the upper right atrium. No pneumothorax. 2. Progressive hypoventilation and perihilar atelectasis.  CT Abd / Pelvis w/ contrast 11/11 IMPRESSION: 1. Significant distension of the gallbladder, gallstones, and new gallbladder wall thickening consistent with acute cholecystitis. 2. Small bilateral pleural effusions, body wall edema. 3. Significant stool burden within the rectosigmoid colon suggests fecal impaction. 4. Suprapubic catheter unremarkable in appearance. Urinary bladder wall is thickened. 5. Smaller collections posterior to the urinary bladder compared prior study.  CXR 11/16 IMPRESSION: Limited study by poor inspiration. Cardiomediastinal silhouette is stable. Mild basilar atelectasis. No active disease.  CT Chest/Abdomen/Pelvis 11/25 IMPRESSION: Limited study due to suboptimal contrast opacification. Mild subsegmental atelectasis in both lungs. Postoperative fluid collection gallbladder fossa of  unknown sterility. Suprapubic catheter in the bladder with diffuse bladder wall thickening. 4 x 1.5 cm fluid collection posterior to the bladder similar to prior study.  Olin Hauser, DO 10/29/2015, 8:37 AM PGY-3, [place] Intern pager: [phone], text pages welcome

## 2015-10-30 MED ORDER — PROMETHAZINE HCL 25 MG PO TABS: 25.0000 mg | ORAL_TABLET | Freq: Three times a day (TID) | ORAL | Status: DC | PRN

## 2015-10-30 MED ORDER — VANCOMYCIN 50 MG/ML ORAL SOLUTION: 125.0000 mg | Freq: Four times a day (QID) | ORAL | Status: AC

## 2015-10-30 MED ORDER — ENSURE ENLIVE PO LIQD: 237.0000 mL | Freq: Two times a day (BID) | ORAL | Status: DC

## 2015-10-30 NOTE — Care Management Important Message (Signed)
Important Message  Patient Details  Name: Mindi SlickerJudith Oviedo MRN: [id number] Date of Birth: 1976/04/18   Medicare Important Message Given:  Yes    Neena Beecham, Annamarie Majorheryl U, RN 10/30/2015, 11:12 AM

## 2015-10-30 NOTE — Discharge Instructions (Signed)
You will complete antibiotics at home for the C. Diff infection that caused you to have diarrhea.  - you have a follow up appointment with Dr. Waynetta SandyWight [facility]   [facility] ______[facility] LAPAROSCOPIC SURGERY: POST OP INSTRUCTIONS Always review your discharge instruction sheet given to you by the facility where your surgery was performed. IF YOU HAVE DISABILITY OR FAMILY LEAVE FORMS, YOU MUST BRING THEM TO THE OFFICE FOR PROCESSING.   DO NOT GIVE THEM TO YOUR DOCTOR.  1. A prescription for pain medication may be given to you upon discharge.  Take your pain medication as prescribed, if needed.  If narcotic pain medicine is not needed, then you may take acetaminophen (Tylenol) or ibuprofen (Advil) as needed. 2. Take your usually prescribed medications unless otherwise directed. 3. If you need a refill on your pain medication, please contact your pharmacy.  They will contact our office to request authorization. Prescriptions will not be filled after 5pm or on week-ends. 4. You should follow a light diet the first few days after arrival home, such as soup and crackers, etc.  Be sure to include lots of fluids daily. 5. Most patients will experience some swelling and bruising in the area of the incisions.  Ice packs will help.  Swelling and bruising can take several days to resolve.  6. It is common to experience some constipation if taking pain medication after surgery.  Increasing fluid intake and taking a stool softener (such as Colace) will usually help or prevent this problem from occurring.  A mild laxative (Milk of Magnesia or Miralax) should be taken according to package instructions if there are no bowel movements after 48 hours. 7. Unless discharge instructions indicate otherwise, you may remove your bandages 24-48 hours after surgery, and you may shower at that time.  You may have steri-strips (small skin tapes) in place  directly over the incision.  These strips should be left on the skin for 7-10 days.  If your surgeon used skin glue on the incision, you may shower in 24 hours.  The glue will flake off over the next 2-3 weeks.  Any sutures or staples will be removed at the office during your follow-up visit. 8. ACTIVITIES:  You may resume regular (light) daily activities beginning the next day--such as daily self-care, walking, climbing stairs--gradually increasing activities as tolerated.  You may have sexual intercourse when it is comfortable.  Refrain from any heavy lifting or straining until approved by your doctor. a. You may drive when you are no longer taking prescription pain medication, you can comfortably wear a seatbelt, and you can safely maneuver your car and apply brakes. b. RETURN TO WORK:  __________________________________________________________ 9. You should see your doctor in the office for a follow-up appointment approximately 2-3 weeks after your surgery.  Make sure that you call for this appointment within a day or two after you arrive home to insure a convenient appointment time. 10. OTHER INSTRUCTIONS: __________________________________________________________________________________________________________________________ __________________________________________________________________________________________________________________________ WHEN TO CALL YOUR DOCTOR: 1. Fever over 101.0 2. Inability to urinate 3. Continued bleeding from incision. 4. Increased pain, redness, or drainage from the incision. 5. Increasing abdominal pain  The clinic staff is available to answer your questions during regular business hours.  Please dont hesitate to call and ask to speak to one of the nurses for clinical concerns.  If you have a medical emergency, go to the nearest emergency room or call 911.  A  surgeon from [facility] is always on call at the hospital. [address]  [address], [place], [place]  [address] ? [address] [address], [place], [place]   [address] [phone] ? [phone] ? FAX [phone] Web site: [email or web address]

## 2015-10-30 NOTE — Progress Notes (Signed)
Family Medicine Teaching Service Daily Progress Note Intern Pager: [phone]  Patient name: [name] [name] Medical record number: [id number] Date of birth: March 16, 1976 Age: 39 y.o. Gender: female  Primary Care Provider: Luiz Blare, DO Consultants: ID, Surgery Code Status: FULL  Pt Overview and Major Events to Date:  11/6-11/7: Admitted for urosepsis; refractory to 4 litres of fluid with sbp 84 despite low dose levophed. Has been off pressors for most of 11/7. [place] cath changed as well. Started on Vanc and Imipenem  11/8: Transferred to floor from [place] 11/9: discontinued Vanc due to urine cultures showing ESBL and no growth with blood cultures  11/10: hgb 6.7; no active bleeding; transfuse 1 unit pRBC  11/11: Abx switched to Ertapenem; MRCP (unable to be obtain due to baclofen pump) 11/12: Surgery consulted, on CT abd/pelvis dx concern acute choley > to OR today for lap choley 11/17: Ertapenem stopped 11/19: Fluconazole for fungemia 11/20: Fever spike 101.30F (repeated cultures); changed suprapubic catheter due to funguria, d/c fluconazole per ID 11/21: Fosfomycin x 1 dose for presumed cystitis 11/22: Treating for C Diff infection PO Vanc 11/23: d/c IJ 11/25: Low-grade temp 100.66F 6pm. CT Chest, Abd/Pelvis > post-op fluid changes in GB wall. 11/27: Afebrile x 48 hours, continue Vanc PO for C Diff  Assessment and Plan:  # C. Diff Infection, likely secondary to long course of antibiotics - Improving Significant risk factors with prolonged antibiotic therapy for ESBL UTI. Confirmed dx with C Diff antigen and toxin Positive. Diarrhea improving. Abdominal pain resolved. - Enteric contact precautions - Vancomycin 550m 4x/day PO (11/22>>last day 11/06/15) for 14 days  - holding probiotic and PPI  # Nausea, without vomiting - persistent with mild improvement Last QTc on AM EKG 496 (11/25) - Continue scheduled Phenergan 1 hour prior to meals TID; Zofran 838mq 8hr PRN - Anticipate  continue anti-emetics, phenergan on DC for PRN  # Fever of Unknown Origin, suspected to be related to C Diff Last low-grade temp 100.66F at 1800 (11/25). Could be due to autonomic dysreflexia or from pain from possibly malpositioned suprapubic catheter. Fever thought to be due to C. Diff at this time. Drug reaction fever had also been considered. No obvious source of infection noted from CXR, exam. Leukoctyosis resolved. Received two doses of fosfomycin 11/21 and 11/23 for presumed Cystitis 2/2 catheter infection; stopped because catheter replaced. Now with c. Diff infection after multiple antibiotic exposures. - 11/23: IJ removed, as it had been in since admission, and peripheral access obtained.  - 11/16: repeat blood cultures: NG x 5 days (final 11/21) - 11/18 Blood Cx IJ: NG x 5 days (final 11/23) - 11/20 blood cultures: NG - 11/20 UA: Mod LE, neg nitrite, TNTC WBC, yeast, many bacteria - 11/25 Blood Cx: NGTD x 2 days - ID consulted, appreciate recs: continue to treat C.Diff; will monitor - CRP 9.9, ESR 50, ANA negative  - bilateral LE dopplers: negative for DVT/Superficial vein thrombosis  - CT chest, abdomen, and pelvis to evaluate for occult infection >> negative for obvious sign of infection. Noted small post-operative fluid in GB wall, discussed with General Surgery today 11/27, reviewed images and advised that this would not warrant further surgical intervention. Of note, patient has soft abdomen and non-tender.  # Hypotension: Resolved Patient asymptomatic, normal mentation. Has been responsive to IVF boluses. - will continue to monitor  # Itching: No significant rashes, only small spot of blanchable erythema on anterior right shoulder. Benadryl did not give relief - Claritin - Hydrocortisone  1% cream to affected areas BID - Switch to cotton gown   # Presumed catheter associated UTI, Treated: Repeat UA 11/20 mod LE, neg nitrite, TMTC WBC, many bact . Lower abdominal discomfort  concerning for cystitis. Catheter appears grossly in place. - Per ID, Fosfomycin x 1 11/21 dose sufficient - US Pelvis 11/24: Foley catheter in place  # Normocytic Anemia, presumed chronic IDA: s/p feraheme S/p 1u PRBC 10/12/15 with Hgb to 6.7. No active bleeding noted clinically. FOBT negative.  - Current Hgb trend stable - anemia studies: Ferritin 58; Fe 9; TIBC 241; saturation ratio 4%; folate 16.1; Vit B12 1076 (H); s/p IV Iron 11/11 and 11/15 - Will monitor  # Hypokalemia - Resolved: Likely due to diarrhea.  Mg 2.0 (11/22)  #Funguria- resolved: 11/16 urine culture grew >100k colonies of yeast - d/c fluconazole 11/21 after replacing suprapubic catheter 11/20 (per ID)  # ESBL E Coli Pyelonephritis w/ suprapubic catheter - Resolved. Suprapubic catheter changed 10/09/15 and again 11/20 - completed abx therapy: IV Ertapenem ( 11/11-11/17); Imipenem 11/7-11/10) - Blood culture 11/6: coagulase negative staph in one bottle (likely contaminant); second bottle NGTD x 5 day (final) - Repeat BCx NGTD  # Acute Choleycstitis s/p lap choley (11/12) - Resolved - Continue pain control, anti-emetics PRN - Post-operative fluid around GB on repeat CT 11/25 - Discussed course with Gen Surgery again today 11/27, recommend no further surgical intervention. No sign of abscess or re-infection. Recommend outpatient follow-up. Clear to discharge from surgery stand point.  # Transaminitis, likely in setting of severe sepsis and possible shock liver - Resolved   # Fecal Impaction: on CT in rectosigmoid colon: (Resolved). Now with C. diff colitis.  -holding home Miralax and probiotics   # Major Depression/Anxiety: - cont home Klonopin  - cont home Seroquel  # GERD: - protonix - magic mouth wash  # Stage 1 Decubitus Ulcer: erythema of skin but no skin breakdown. No tenderness.  - prophylaxis with dressing per nursing   # Hx PE with hx of anticoagulation: Per patient, had recent vaginal bleeding which  was evaluated at [facility] this month; was told to stop taking anticoagulation until outpatient follow up.  - per chart review in care everywhere, patient was admitted to [facility] for vaginal bleeding and acute symptomatic anemia in Oct 2016 in the setting of anticoagulation use (xeralto). She was taken to the OR on 09/27/15 for IVC filter placement. At the time of discharge, her Xarelto was held until her follow up appointment in 2 weeks with Dr. Amalia Hailey and a plan will need to be made regarding anticoagulation and her IVC filter at this appointment - Additionally per chart review, patient's hemoglobin had been unstable after initiation of systemic anticoagulation per urology - will continue to hold Xeralto  # Cerebral Palsy - Tizanidine and Baclofen   FEN/GI: normal diet, Ensure PPx: SQ Heparin   Disposition: Treating for C Diff with vanco PO as likely source of prior fever of unknown origin, afebrile x 48 hours. Considered medically stable for discharge to SNF once arrangements made, anticipate DC 11/28 to SNF.  Subjective:  - no fevers overnight - nausea still present although improved   Objective: Temp:  [97.3 F (36.3 C)-100 F (37.8 C)] 97.3 F (36.3 C) (11/28 0556) Pulse Rate:  [71-115] 72 (11/28 0556) Resp:  [16-18] 18 (11/28 0556) BP: (95-121)/(47-63) 95/47 mmHg (11/28 0556) SpO2:  [93 %-100 %] 100 % (11/28 0556) Weight:  [126 lb 12.8 oz (57.516 kg)] 126 lb  12.8 oz (57.516 kg) (11/27 2037)   Physical Exam: General: resting in bed, comfortable, cooperative, NAD HEENT: Neck contracted to right, gauze over site of IJ removal, clear dry intact;  MMM Cardiovascular: RRR, no m/r/g  Respiratory: CTAB bilaterally  Abdomen: soft, non-tender, improved without significant tenderness in RUQ, non-distended. Lap Incisions dry and without erythema, Pump palpated in RLQ. Extremities: warm and dry, calves non-tender  Laboratory:  Recent Labs Lab 10/27/15 0345 10/28/15 0615  10/29/15 0326  WBC 6.3 6.2 6.3  HGB 9.5* 9.5* 10.5*  HCT 31.9* 31.6* 34.1*  PLT 387 389 342    Recent Labs Lab 10/27/15 0345 10/28/15 0615 10/29/15 0326  NA 138 137 140  K 3.4* 4.5 3.7  CL 105 102 104  CO2 _0 BUN <5* <5* <5*  CREATININE 0.44 0.42* 0.46  CALCIUM 8.4* 8.7* 8.8*  GLUCOSE 108* 117* 100*   Anemia Panel - Iron 9, TIBC 241, Sat ratio 4, ferritin 58 Folate 16 Vitamin B-12, 1076  Imaging/Diagnostic Tests: ECHO: EF 65-70%; normal  CXR 11/6: IMPRESSION: Negative for pneumonia.  RUQ Abd [place] (11/7) IMPRESSION: Cholelithiasis with indeterminate presence of acute cholecystitis given conflicting findings (there is gallbladder distention and mild wall thickening but no focal tenderness). HIDA scan could evaluate cystic duct patency.  CXR 11/7: IMPRESSION: 1. Left IJ central line with tip at the upper right atrium. No pneumothorax. 2. Progressive hypoventilation and perihilar atelectasis.  CT Abd / Pelvis w/ contrast 11/11 IMPRESSION: 1. Significant distension of the gallbladder, gallstones, and new gallbladder wall thickening consistent with acute cholecystitis. 2. Small bilateral pleural effusions, body wall edema. 3. Significant stool burden within the rectosigmoid colon suggests fecal impaction. 4. Suprapubic catheter unremarkable in appearance. Urinary bladder wall is thickened. 5. Smaller collections posterior to the urinary bladder compared prior study.  CXR 11/16 IMPRESSION: Limited study by poor inspiration. Cardiomediastinal silhouette is stable. Mild basilar atelectasis. No active disease.  CT Chest/Abdomen/Pelvis 11/25 IMPRESSION: Limited study due to suboptimal contrast opacification. Mild subsegmental atelectasis in both lungs. Postoperative fluid collection gallbladder fossa of unknown sterility. Suprapubic catheter in the bladder with diffuse bladder wall thickening. 4 x 1.5 cm fluid collection posterior to the  bladder similar to prior study.  Smiley Houseman, MD 10/30/2015, 6:49 AM PGY-1, [place] Intern pager: [phone], text pages welcome

## 2015-10-30 NOTE — Clinical Social Work Note (Signed)
[facility] by [facility]. CSW talked by phone with patient's father, Rudell Cobbom Kryder and patient informed CSW that her friend Nedra HaiLee is aware of today's discharge.   Genelle BalVanessa Ashmi Blas, MSW, LCSW Licensed Clinical Social Worker Clinical Social Work Department [facility] [phone]

## 2015-10-30 NOTE — Progress Notes (Signed)
POD # 16 s/p lap chole for acute cholecystitis/cholelithiasis Dr.  Janee Mornhompson  Review of the chart shows that patient is discharging home today per medicine service.  ID following for C.Diff.  We have follow-up in discharge section of epic.  Low fat diet.  Post-op instructions given.    Will sign off, call with questions/concerns   [name] GuildMegan Juli Odom, PA-C General Surgery [facility] [phone]

## 2015-10-31 ENCOUNTER — Non-Acute Institutional Stay (SKILLED_NURSING_FACILITY): Payer: Medicare Other | Admitting: Internal Medicine

## 2015-10-31 ENCOUNTER — Encounter: Payer: Medicare Other | Attending: Physical Medicine & Rehabilitation

## 2015-10-31 ENCOUNTER — Ambulatory Visit: Payer: Medicare Other | Admitting: Physical Medicine & Rehabilitation

## 2015-10-31 DIAGNOSIS — N390 Urinary tract infection, site not specified: Secondary | ICD-10-CM

## 2015-10-31 DIAGNOSIS — M24562 Contracture, left knee: Secondary | ICD-10-CM | POA: Diagnosis not present

## 2015-10-31 DIAGNOSIS — G801 Spastic diplegic cerebral palsy: Secondary | ICD-10-CM

## 2015-10-31 DIAGNOSIS — M24561 Contracture, right knee: Secondary | ICD-10-CM | POA: Diagnosis not present

## 2015-10-31 DIAGNOSIS — N10 Acute tubulo-interstitial nephritis: Secondary | ICD-10-CM

## 2015-10-31 DIAGNOSIS — B9620 Unspecified Escherichia coli [E. coli] as the cause of diseases classified elsewhere: Secondary | ICD-10-CM

## 2015-10-31 DIAGNOSIS — A09 Infectious gastroenteritis and colitis, unspecified: Secondary | ICD-10-CM | POA: Diagnosis not present

## 2015-10-31 DIAGNOSIS — M6281 Muscle weakness (generalized): Secondary | ICD-10-CM | POA: Diagnosis not present

## 2015-10-31 DIAGNOSIS — R41841 Cognitive communication deficit: Secondary | ICD-10-CM | POA: Diagnosis not present

## 2015-10-31 DIAGNOSIS — R6521 Severe sepsis with septic shock: Secondary | ICD-10-CM | POA: Diagnosis not present

## 2015-10-31 DIAGNOSIS — K810 Acute cholecystitis: Secondary | ICD-10-CM

## 2015-10-31 DIAGNOSIS — G809 Cerebral palsy, unspecified: Secondary | ICD-10-CM | POA: Diagnosis not present

## 2015-10-31 DIAGNOSIS — G808 Other cerebral palsy: Secondary | ICD-10-CM | POA: Diagnosis not present

## 2015-10-31 DIAGNOSIS — Z1612 Extended spectrum beta lactamase (ESBL) resistance: Secondary | ICD-10-CM | POA: Diagnosis not present

## 2015-10-31 DIAGNOSIS — R293 Abnormal posture: Secondary | ICD-10-CM | POA: Diagnosis not present

## 2015-10-31 DIAGNOSIS — A419 Sepsis, unspecified organism: Secondary | ICD-10-CM | POA: Diagnosis not present

## 2015-10-31 DIAGNOSIS — R2681 Unsteadiness on feet: Secondary | ICD-10-CM | POA: Diagnosis not present

## 2015-10-31 DIAGNOSIS — R1311 Dysphagia, oral phase: Secondary | ICD-10-CM | POA: Diagnosis not present

## 2015-10-31 NOTE — Progress Notes (Signed)
Patient ID: [name] [name], female   DOB: 09/09/1976, 39 y.o.   MRN: [id number]   Facility; [facility] Chief complaint; admission to SNF post stay at [facility] 11/6 through 10/30/2015 History; we know this patient from a protracted stay in the building which ended about a year ago. As I understand things she is recently been staying at [facility] nursing home although she was not under our service there. She was admitted to hospital acutely ill with sepsis. She required pressors in the ICU. Urine culture grew a multidrug resistant Escherichia coli. She has a chronic suprapubic catheter due to urinary retention. She was treated for this with carbapenem's as this was the only real sensitivity choice. I gather she developed fevers even with antibiotics and was ultimately discovered to have acute cholecystitis and underwent a cholecystectomy laparoscopically. Finally she developed a pseudomembranous colitis and is under treatment for this.  The patient's major source of disability is cerebral palsy. She is nonambulatory. But able to get herself around in her electric wheelchair. I remember her workup when she was here documented urinary retention and she was followed by urology and ended up with a suprapubic catheter.  The patient's major complaint currently is nausea. She states that she was getting nausea medications before meals meals in the hospital also if we can provide that. I also note that she has a history of recurrent venous thromboembolism and a history of pulmonary emboli. She was on Coumadin however this was apparently put on hold due to vaginal bleeding during a admission to [facility] in October. She currently has an IVC filter that they placed. She also has a history of endometriosis and follows with gynecology at [facility].  Past Medical History  Diagnosis Date  . Cerebral palsy ([name])   . Pulmonary embolism ([name])     Lifetime Coumadin  . Cerebral palsy ([name])     Spastic Cerebral palsy,  mentally intact  . Contracture, joint, multiple sites     Electric wheelchair, uses left hand to operate chair.   [name] Kitchen Hypertension   . OCD (obsessive compulsive disorder)   . Depression   . Migraine   . [name]   . Dysarthria   . Endometriosis   . History of recurrent UTIs   . GERD (gastroesophageal reflux disease)   . Pulmonary embolism ([name]) 2011, 01/2011    Will be on lifetime coumadin  . Anemia   . Abdominal pain 01/02/2012    Overview:  Overview:  Per Previous pcp note- Dr. Ta--Pt has history of endometriosis and had DUB in 2011.  She She had u/s that showed normal endometrial stripe.  She had endometrial bx that was wnl.  She has a lot of abd cramping every month.  This cramping was sometimes not relieved with hydrocodone.  She was referred to [facility] for IUD placement to help endometriosis and abd cramping.  [name]  . Macroscopic hematuria 03/10/2013    status post replacement of suprapubic tube 03/04/2013   . HYPERTENSION, BENIGN 01/31/2010    Qualifier: Diagnosis of  By: [name], MD, [name] History of endometriosis 10/2012    OBGYN [facility]: Laparoscopic Endometrial Ablation, TVH,  . Gram positive sepsis ([name]) 10/24/2014  . HCAP (healthcare-associated pneumonia) 10/25/2014  . Abdominal pain 01/02/2012    Overview:  Overview:  Per Previous pcp note- Dr. Ta--Pt has history of endometriosis and had DUB in 2011.  She She had u/s that showed normal endometrial stripe.  She had endometrial  bx that was wnl.  She has a lot of abd cramping every month.  This cramping was sometimes not relieved with hydrocodone.  She was referred to [facility] for IUD placement to help endometriosis and abd cramping.  [name] was placed 04/30/11 by [facility].  Pt still complains of abd cramping, which I am treating with hydrocodone and ultram. Pt reports some initial relief of pain from IUD, but states it has now returned back to similar to previous cramping. Gave patient a shot of  Toradol 60 mg IM today-since positive exacerbation of abdominal cramping during past 2 weeks. Physical exam reassuring. Patient may need to return to [facility] for further workup since she is plugged in with the GYN providers there for further treatment options regarding endometriosis and abdominal cramping. The dysfunctional uterine bleeding now well contr  . Acute respiratory failure with hypoxia ([name])   . Cellulitis 12/22/2014  . Disease of female genital organs 10/24/2010    Overview:  Overview:  Pt has history of endometriosis and had DUB in 2011.  She had u/s that showed normal endometrial stripe.  She had endometrial bx that was wnl.  She has a lot of abd cramping every month.  This cramping was sometimes not relieved with hydrocodone.  She was referred to [facility] for IUD placement to help endometriosis and abd cramping.  [name] was placed 04/30/11 by [facility].  Pt still complains of abd cramping, which was treated with hydrocodone and ultram.   Last seen at [facility] by OB/GYN 12/24/11, given Depo Lupron injection.  Ordering CT scan to r/o additional cause of abdominal pain (pt with known endometriosis and will not tolerate vaginal U/S, pain not improved s/p [name] or with Lupron injection-- only caused hot flashes and amenorrhea, no pain relief).  Also made referral to pain clinic for better pain control.  Do not think hysterectomy would help with pain, and concern for contamination of baclofen pump if did proceed with surgery.    8/5/13[name]: Pt was seen by   . Dyspnea and respiratory abnormality 02/04/2011    Overview:  Overview:  Pt has history of recurrent PE and is on chronic coumadin.  Pt has been complaining of dyspnea that she describes as chest tightness.  She has been to the ER multiple times for this.  Usually she gets CTA and serial CE.  She has had at least 6 CTA in a period of since 2011.  She also has an O2 requirement. It is unclear why pt feels dyspneic, complains of chest  tightness, or has O2 requirement.  I thought that her complaint may be from deconditioning and referred her to PT.  Unfortunately Medicaid only pays for 4 PT visits.  Pt should be doing these exercises at home, but she has not.  I have referred her to [facility], and she has been seen by [name].  He did not understand the etiology of her dyspnea and O2 requirement.  He will continue to follow her.   Last Assessment & Plan:  Pt presents with concerns for chest tightness and dyspnea.  She was seen in ED yesterday and discharged home.  While there she had a CT chest 04/20/11 showed 1.  Multifocal degradation as detailed above.  N  . Gross hematuria 03/12/2013    Overview:  Overview:  status post replacement of suprapubic tube 03/04/2013  Last Assessment & Plan:  Recent urine culture collected in ED on 5/4 did not result in significant growth. Moreover, patient  has been asymptomatic with normal WBC, no fever and no true symptoms making symptomatic UTI less likely.  Will continue to monitor for any sign of infection.  Patient's xarelto had been held during her recent hospitalization due to hematuria. This has since been re-started. Monitor bleeding.    [name] Kitchen. History of anticoagulant therapy 10/21/2012  . Infantile cerebral palsy ([name]) 01/29/2007    Overview:  She has spastic CP.  She is mentally intact.  She is wheelchair bound.  She is wheelchair bound for ambulation      . Intracervical pessary 05/07/2011    Overview:  Overview:  [facility] 04/30/11 to treat DUB and Endometriosis.  She [facility] but was considered a poor surgical candidate and [facility].  For DUB she had pelvic ultrasound that showed thin stripe and she had endometrial Bx by Dr Jennette KettleNeal in GYN clinic, which was negative.     . Parapneumonic effusion 10/25/2014  . Presence of intrathecal baclofen pump 03/07/2014    R LQ. Insertion T10.    [name] Kitchen. Psychiatric illness 10/31/2014  . Recurrent pulmonary embolism ([name])  05/03/2011    Pt has history of recurrent PE and is on chronic coumadin.  Pt has been complaining of dyspnea that she describes as chest tightness.  She has been to the ER multiple times for this.  Usually she gets CTA and serial CE.  She has had at least 6 CTA in a period of since 2011.  She also has an O2 requirement. It is unclear why pt feels dyspneic, complains of chest tightness, or has O2 requirement.  I thought that her complaint may be from deconditioning and referred her to PT.  Unfortunately Medicaid only pays for 4 PT visits.  Pt should be doing these exercises at home, but she has not.  [facility], and she has been seen by Dr Marchelle Gearingamaswamy.  He did not understand the etiology of her dyspnea and O2 requirement.  He will continue to follow her.     . Seborrheic keratosis 01/18/2011  . Spasticity 01/05/2014  . Transient alteration of awareness, recurrent 09/08/2006    Recurrent episodes where [name]  loses contact with her world and that have been studied thoroughly and appear nonepileptic in nature per Dr Terrace ArabiaYan (neurology) The patient has had episodes starting in 2007 that initially began 1-4 times a day during which time she would have periods of unawareness followed by confusion. This continuous video EEG monitoring performed from October 8-12, 2007. Had   . Acute cystitis without hematuria   . Acute cystitis with hematuria   . Urethra dilated and patulous 07/03/2015    Patulous, Dilated Urethra. 5mL balloon on a 22-Fr catheter hoping this would prevent the bladder spasm from pushing the balloon down the urethra (Dr Logan BoresEvans, Urology [facility], July 2016)     Past Surgical History  Procedure Laterality Date  . Colposcopy  06/2000  . Cesarean section      x 2  . Tubal ligation  2003  . Carpal tunnel release  08/2008    Dr Teressa SenterSypher  . Wrist surgery  06/2010    Dr Dierdre SearlesLi[facility]  . Baclofen pump refill      x 3 times  . Appendectomy    . Intrauterine device insertion  04/30/11      Inserted by [facility] for endometriosis  . Laparoscopically assisted vag hysterectomy  10/27/2012  .  Multiple extractions with alveoloplasty Bilateral 07/19/2013    Procedure: EXTRACTIONS #4, 5,62,13,08;  Surgeon: Francene Finders, DDS;  Location: [facility] OR;  Service: Oral Surgery;  Laterality: Bilateral;  . Pain pump implantation N/A 03/07/2014    Procedure: baclofen pump revision/replacement and Catheter connection replacement;  Surgeon: Cristi Loron, MD;  Location: [facility];  Service: Neurosurgery;  Laterality: N/A;  baclofen pump revision/replacement and Catheter connection replacement  . Programable baclofen pump revision  03/17/14    Battery Replacement   . Cholecystectomy  10/14/2015    Procedure: LAPAROSCOPIC CHOLECYSTECTOMY;  Surgeon: Violeta Gelinas, MD;  Location: [facility] OR;  Service: General;;    Current Outpatient Prescriptions on File Prior to Visit  Medication Sig Dispense Refill  . acetaminophen (TYLENOL) 325 MG tablet Take 650 mg by mouth every 6 (six) hours as needed for moderate pain.     . ARIPiprazole (ABILIFY) 2 MG tablet Take 2 mg by mouth at bedtime.    . bacitracin-polymyxin b (POLYSPORIN) ointment Apply 1 g topically 2 (two) times daily. Apply around Suprapubic ostomy site    . baclofen (LIORESAL) 20 MG tablet Take 20 mg by mouth 4 (four) times daily. For bladder spasms    . bisacodyl (DULCOLAX) 10 MG suppository Place 10 mg rectally daily as needed for moderate constipation.    . clonazePAM (KLONOPIN) 1 MG tablet Take 1 tablet (1 mg total) by mouth 4 (four) times daily. 120 tablet 5  . Dimethicone (MOISTURE BARRIER EX) Apply 1 application topically as needed (for genitourinary).     . feeding supplement, ENSURE ENLIVE, (ENSURE ENLIVE) LIQD Take 237 mLs by mouth 2 (two) times daily between meals. 237 mL 12  . flavoxATE (URISPAS) 100 MG tablet Take 100 mg by mouth 3 (three) times daily as needed for bladder spasms.    [name] Kitchen FLUoxetine (PROZAC) 10 MG capsule Take  10 mg by mouth daily.    [name] Kitchen ibuprofen (ADVIL,MOTRIN) 200 MG tablet Take 400 mg by mouth every 8 (eight) hours as needed for moderate pain.     . magnesium hydroxide (MILK OF MAGNESIA) 400 MG/5ML suspension Take 30 mLs by mouth daily as needed for mild constipation.    . Multiple Vitamin (MULTIVITAMIN) tablet Take 1 tablet by mouth daily.    [name] Kitchen oxyCODONE (OXY IR/ROXICODONE) 5 MG immediate release tablet Take 5 mg by mouth every 4 (four) hours as needed for moderate pain or severe pain (may give 2 tablets every 4 hours for severe pain).    . promethazine (PHENERGAN) 25 MG tablet Take 1 tablet (25 mg total) by mouth 3 (three) times daily with meals as needed for nausea or vomiting. 30 tablet 0  . QUEtiapine (SEROQUEL) 100 MG tablet Take 1 tablet (100 mg total) by mouth at bedtime.    . Sodium Phosphates (RA SALINE ENEMA) 19-7 GM/118ML ENEM Place 1 application rectally once.    [name] Kitchen tiZANidine (ZANAFLEX) 4 MG capsule Take 4 mg by mouth 2 (two) times daily at 8 am and 10 pm.    . vancomycin (VANCOCIN) 50 mg/mL oral solution Take 2.5 mLs (125 mg total) by mouth every 6 (six) hours. 74 mL 0    Social; as noted the patient has been at [facility] skilled facility. She was a patient here up until sometime last year. She does not have any advanced directives  reports that she has never smoked. She has never used smokeless tobacco. She reports that she drinks about 0.6 oz of alcohol per week. She reports that  she does not use illicit drugs.  family history includes Alzheimer's disease in her paternal grandfather; Asthma in her father; Breast cancer in her paternal grandmother; Cancer in her paternal grandmother; Colon cancer in her maternal grandmother; Heart attack in her maternal grandfather.  Review of systems; very limited in this patient secondary to her affected speech which is difficult to understand HOWEVER Respiratory; no cough no sputum Cardiac no chest pain GI; states that she has nausea which makes it  difficult for her to eat. Requests before meals meal Phenergan. I am unclear about the current diarrhea frequency GU suprapubic catheter Extremities no overt pain Mental status; the patient appears to be stable.  Physical examination Gen. patient looks much the same as I remember her. Vitals; blood pressure 116/64 respiratory rate 18 temperature 97.9 pulse 68 Respiratory; clear entry bilaterally no crackles or wheezes Cardiac heart sounds are normal no murmurs she appears to be euvolemic. Lymph; none palpable in the clavicular, cervical, axillary or inguinal areas. Abdomen; surgical scars from her cholecystectomy appear well-healed. She has a baclofen pump in the lower central quadrant. No liver no spleen no tenderness bowel sounds are positive GU suprapubic catheter site looks fine tenderness no CVA tenderness Skin; superficial abrasions on her bilateral buttock's these will need to be followed. Neurologic; marked disability secondary to cerebral palsy however this is much the same as I remember from last time. She is able to use her left hand propel her wheelchair. Mental status;, relaxed no overt issues are obvious.  Impression/plan #1 sepsis secondary to a multi-drug-resistant [ESBL] Escherichia coli. Treated with carbapenem. Follow-up urine culture showed yeast and she received a treatment course with Diflucan. #2 acute cholecystitis treated with a laparoscopic cholecystectomy; this was felt to be causing fever at the time of her initial presentation #3 pseudomembranous colitis on vancomycin which should continue I believe for about another 10 days to complete a two-week course #4 suprapubic catheter with a history of recurrent UTIs. Will need to be vigilant about this without ordering urines on her that she is asymptomatic. #5 continued complaints of nausea requesting before meals meal Phenergan. For now I'll go ahead and order this. #6 history of recurrent veno thromboembolic disease.  Now off chronic anticoagulation apparently secondary to vaginal bleeding. This is followed at [facility]. She has an IVC filter which she appears to be tolerating well. She was supposed to follow with Dr. Eudelia Bunch of urology at [facility] with a decision made about reintroducing anticoagulation [xarelto] and her IVC filter. I am not sure how Dr. Logan Bores is in the middle of this decision #7 history of endometriosis again followed by gynecology at [facility] need to make sure she has follow-up appointments #8 she has a history of psychiatric problems listed as depression although I wonder if my notes will reflect something more than this. She is on a extensive combination of psychiatric medications including 2 antipsychotics although I'm going to continue these for now until I have a chance to look over the history of this. Her psychiatric status appears stable

## 2015-11-01 ENCOUNTER — Other Ambulatory Visit: Payer: Self-pay | Admitting: *Deleted

## 2015-11-01 DIAGNOSIS — M24561 Contracture, right knee: Secondary | ICD-10-CM | POA: Diagnosis not present

## 2015-11-01 DIAGNOSIS — R293 Abnormal posture: Secondary | ICD-10-CM | POA: Diagnosis not present

## 2015-11-01 DIAGNOSIS — R2681 Unsteadiness on feet: Secondary | ICD-10-CM | POA: Diagnosis not present

## 2015-11-01 DIAGNOSIS — A419 Sepsis, unspecified organism: Secondary | ICD-10-CM | POA: Diagnosis not present

## 2015-11-01 DIAGNOSIS — M6281 Muscle weakness (generalized): Secondary | ICD-10-CM | POA: Diagnosis not present

## 2015-11-01 DIAGNOSIS — M24562 Contracture, left knee: Secondary | ICD-10-CM | POA: Diagnosis not present

## 2015-11-01 LAB — CULTURE, BLOOD (ROUTINE X 2)
CULTURE: NO GROWTH
Culture: NO GROWTH

## 2015-11-02 DIAGNOSIS — R293 Abnormal posture: Secondary | ICD-10-CM | POA: Diagnosis not present

## 2015-11-02 DIAGNOSIS — Z1612 Extended spectrum beta lactamase (ESBL) resistance: Secondary | ICD-10-CM | POA: Diagnosis not present

## 2015-11-02 DIAGNOSIS — R1311 Dysphagia, oral phase: Secondary | ICD-10-CM | POA: Diagnosis not present

## 2015-11-02 DIAGNOSIS — M24561 Contracture, right knee: Secondary | ICD-10-CM | POA: Diagnosis not present

## 2015-11-02 DIAGNOSIS — R6521 Severe sepsis with septic shock: Secondary | ICD-10-CM | POA: Diagnosis not present

## 2015-11-02 DIAGNOSIS — G808 Other cerebral palsy: Secondary | ICD-10-CM | POA: Diagnosis not present

## 2015-11-02 DIAGNOSIS — R41841 Cognitive communication deficit: Secondary | ICD-10-CM | POA: Diagnosis not present

## 2015-11-02 DIAGNOSIS — G809 Cerebral palsy, unspecified: Secondary | ICD-10-CM | POA: Diagnosis not present

## 2015-11-02 DIAGNOSIS — R2681 Unsteadiness on feet: Secondary | ICD-10-CM | POA: Diagnosis not present

## 2015-11-02 DIAGNOSIS — Z9359 Other cystostomy status: Secondary | ICD-10-CM | POA: Diagnosis not present

## 2015-11-02 DIAGNOSIS — A419 Sepsis, unspecified organism: Secondary | ICD-10-CM | POA: Diagnosis not present

## 2015-11-02 DIAGNOSIS — M6281 Muscle weakness (generalized): Secondary | ICD-10-CM | POA: Diagnosis not present

## 2015-11-02 DIAGNOSIS — M24562 Contracture, left knee: Secondary | ICD-10-CM | POA: Diagnosis not present

## 2015-11-03 DIAGNOSIS — A419 Sepsis, unspecified organism: Secondary | ICD-10-CM | POA: Diagnosis not present

## 2015-11-03 DIAGNOSIS — M24562 Contracture, left knee: Secondary | ICD-10-CM | POA: Diagnosis not present

## 2015-11-03 DIAGNOSIS — R293 Abnormal posture: Secondary | ICD-10-CM | POA: Diagnosis not present

## 2015-11-03 DIAGNOSIS — R2681 Unsteadiness on feet: Secondary | ICD-10-CM | POA: Diagnosis not present

## 2015-11-03 DIAGNOSIS — M6281 Muscle weakness (generalized): Secondary | ICD-10-CM | POA: Diagnosis not present

## 2015-11-03 DIAGNOSIS — M24561 Contracture, right knee: Secondary | ICD-10-CM | POA: Diagnosis not present

## 2015-11-06 ENCOUNTER — Other Ambulatory Visit: Payer: Self-pay | Admitting: *Deleted

## 2015-11-06 DIAGNOSIS — M24561 Contracture, right knee: Secondary | ICD-10-CM | POA: Diagnosis not present

## 2015-11-06 DIAGNOSIS — A419 Sepsis, unspecified organism: Secondary | ICD-10-CM | POA: Diagnosis not present

## 2015-11-06 DIAGNOSIS — M24562 Contracture, left knee: Secondary | ICD-10-CM | POA: Diagnosis not present

## 2015-11-06 DIAGNOSIS — R293 Abnormal posture: Secondary | ICD-10-CM | POA: Diagnosis not present

## 2015-11-06 DIAGNOSIS — M6281 Muscle weakness (generalized): Secondary | ICD-10-CM | POA: Diagnosis not present

## 2015-11-06 DIAGNOSIS — R2681 Unsteadiness on feet: Secondary | ICD-10-CM | POA: Diagnosis not present

## 2015-11-06 MED ORDER — OXYCODONE HCL 5 MG PO TABS: 5.0000 mg | ORAL_TABLET | ORAL | Status: DC | PRN

## 2015-11-06 NOTE — Telephone Encounter (Signed)
[facility] 

## 2015-11-07 ENCOUNTER — Ambulatory Visit (INDEPENDENT_AMBULATORY_CARE_PROVIDER_SITE_OTHER): Payer: Medicare Other | Admitting: Family Medicine

## 2015-11-07 ENCOUNTER — Encounter: Payer: Self-pay | Admitting: Family Medicine

## 2015-11-07 VITALS — BP 132/36 | HR 106 | Temp 98.8°F

## 2015-11-07 DIAGNOSIS — M24561 Contracture, right knee: Secondary | ICD-10-CM | POA: Diagnosis not present

## 2015-11-07 DIAGNOSIS — M6281 Muscle weakness (generalized): Secondary | ICD-10-CM | POA: Diagnosis not present

## 2015-11-07 DIAGNOSIS — K812 Acute cholecystitis with chronic cholecystitis: Secondary | ICD-10-CM | POA: Diagnosis not present

## 2015-11-07 DIAGNOSIS — R110 Nausea: Secondary | ICD-10-CM

## 2015-11-07 DIAGNOSIS — M24562 Contracture, left knee: Secondary | ICD-10-CM | POA: Diagnosis not present

## 2015-11-07 DIAGNOSIS — R2681 Unsteadiness on feet: Secondary | ICD-10-CM | POA: Diagnosis not present

## 2015-11-07 DIAGNOSIS — N390 Urinary tract infection, site not specified: Secondary | ICD-10-CM

## 2015-11-07 DIAGNOSIS — Z7901 Long term (current) use of anticoagulants: Secondary | ICD-10-CM | POA: Diagnosis not present

## 2015-11-07 DIAGNOSIS — A0472 Enterocolitis due to Clostridium difficile, not specified as recurrent: Secondary | ICD-10-CM

## 2015-11-07 DIAGNOSIS — A419 Sepsis, unspecified organism: Secondary | ICD-10-CM | POA: Diagnosis not present

## 2015-11-07 DIAGNOSIS — A047 Enterocolitis due to Clostridium difficile: Secondary | ICD-10-CM

## 2015-11-07 DIAGNOSIS — R293 Abnormal posture: Secondary | ICD-10-CM | POA: Diagnosis not present

## 2015-11-07 MED ORDER — ONDANSETRON 8 MG PO TBDP
8.0000 mg | ORAL_TABLET | Freq: Three times a day (TID) | ORAL | Status: DC | PRN
Start: 2015-11-07 — End: 2016-11-12

## 2015-11-07 NOTE — Patient Instructions (Addendum)
Nausea: We will ask [facility] to make sure you are given phenergan 1 hour before each meal. We will also add Zofran to be given in between meals if needed for nausea up to 3 times a day as needed  If you did not see the surgeons for follow up yet (your appointment was scheduled for 11/30), you should call and schedule this Follow up with LIEPINS, ANDY, PA-C On 11/01/2015.   Specialty: Surgery   Why: [facility], 9:15am, arrive no later than 8:45am for paperwork   Contact information:   [address] [place] [id number] [phone]        Make sure to keep your appointment with the Urologist on 12/20. You had the IVC filter put in, but you should ask them if you need to be on the Xarelto as well.

## 2015-11-07 NOTE — Progress Notes (Signed)
Subjective:    Patient ID: [name] [name], female    DOB: Nov 27, 1976, 39 y.o.   MRN: [id number]  HPI  CC: hospital follow up   # Hospital follow up for ESBL UTI sepsis/acute cholecystitis:  Prolonged admission from 11/6 to 11/28, admitted to [facility] SNF  Has had persistent nausea while in the hospital which has continued since leaving -- on phenergan currently. Feels like she is not getting the phenergan at the right time (wants it about an hour before each meal) ROS: no fevers or chills, no vomiting, no abdominal pain  # Anticoagulation  Had IVC filter put in during hospitalization  Xarelto is currently being held  Not clear but xarelto was being held by urologist? Inpatient team recommending awaiting urologist outpatient visit before considering restarting ROS: no blood in urine  # s/p cholecystectomy  Feels she is doing well other than nausea  No pain  Wants her incision to be looked at  Says her surgery outpatient appointment was canceled and she needs the number to reschedule  # C. Diff colitis  Still taking the vancomycin  Does not complain of diarrhea, in fact she is complaining of some difficulty with BMs compared to her usual  Social Hx: living at [facility] currently (says she likes it there, doesn't think she wants to go back to [place] but does want to keep [facility] as PCP)  Review of Systems   See HPI for ROS.   Past medical history, surgical, family, and social history reviewed and updated in the EMR as appropriate. Objective:  BP 132/36 mmHg  Pulse 106  Temp(Src) 98.8 F (37.1 C) (Oral)  Wt   LMP 04/05/2011 Vitals and nursing note reviewed  General: NAD, in powered wheelchair with contractures present CV: tachycardic, regular rhythm, no appreciable murmur Resp: clear to auscultation bilaterally, normal effort Abdomen: soft, nontender, surgical incisions (lap) are healing well with some bandage on the umbilical incision that pulls off  without issue Neuro: alert, oriented. UE contractures. Speech is understandable.  Assessment & Plan:  Urinary tract infectious disease Doing well after leaving the hospital other than nausea (which is likely not related to her UTI). She is off antibiotics for this but taking oral vanc for CDI. Her suprapubic cath was changed in the hospital and has follow up with urologist scheduled for later in December.  Long term (current) use of anticoagulants IVC filter now in place. Awaiting urologist visit to see if it is okay to restart xarelto.  Acute cholecystitis s/p lap cholecystectomy 10/14/2015 Doing well post op, apparently missed/needs to reschedule general surgery outpatient visit. Given phone number and information for her to call and reschedule. Surgical incisions appear well healing.  C. difficile colitis Should be nearing end of oral vanc course. No complaints of diarrhea, she would actually prefer to restart the miralax since she says she hasn't been going (which may be partially attributed to her not eating much due to nausea). Wrote order for SNF to restart miralax and then add her dulcolax and senna later after finishing oral vanc.  Nausea without vomiting Wrote order for SNF to give phenergan 1 hour prior to meals. Also add on zofran  ODT to be given between meals. Follow up 1 month to see if this regimen is improved. I did tell patient that she would be able to have the providers at SNF help manage this problem since they would be on site; if they needed to can call our clinic for discussion about  plan of care.

## 2015-11-08 DIAGNOSIS — M6281 Muscle weakness (generalized): Secondary | ICD-10-CM | POA: Diagnosis not present

## 2015-11-08 DIAGNOSIS — R2681 Unsteadiness on feet: Secondary | ICD-10-CM | POA: Diagnosis not present

## 2015-11-08 DIAGNOSIS — F422 Mixed obsessional thoughts and acts: Secondary | ICD-10-CM | POA: Diagnosis not present

## 2015-11-08 DIAGNOSIS — R293 Abnormal posture: Secondary | ICD-10-CM | POA: Diagnosis not present

## 2015-11-08 DIAGNOSIS — M24562 Contracture, left knee: Secondary | ICD-10-CM | POA: Diagnosis not present

## 2015-11-08 DIAGNOSIS — A419 Sepsis, unspecified organism: Secondary | ICD-10-CM | POA: Diagnosis not present

## 2015-11-08 DIAGNOSIS — M24561 Contracture, right knee: Secondary | ICD-10-CM | POA: Diagnosis not present

## 2015-11-09 DIAGNOSIS — R293 Abnormal posture: Secondary | ICD-10-CM | POA: Diagnosis not present

## 2015-11-09 DIAGNOSIS — R2681 Unsteadiness on feet: Secondary | ICD-10-CM | POA: Diagnosis not present

## 2015-11-09 DIAGNOSIS — M24562 Contracture, left knee: Secondary | ICD-10-CM | POA: Diagnosis not present

## 2015-11-09 DIAGNOSIS — M24561 Contracture, right knee: Secondary | ICD-10-CM | POA: Diagnosis not present

## 2015-11-09 DIAGNOSIS — A419 Sepsis, unspecified organism: Secondary | ICD-10-CM | POA: Diagnosis not present

## 2015-11-09 DIAGNOSIS — M6281 Muscle weakness (generalized): Secondary | ICD-10-CM | POA: Diagnosis not present

## 2015-11-09 DIAGNOSIS — R110 Nausea: Secondary | ICD-10-CM | POA: Insufficient documentation

## 2015-11-09 NOTE — Assessment & Plan Note (Signed)
Doing well after leaving the hospital other than nausea (which is likely not related to her UTI). She is off antibiotics for this but taking oral vanc for CDI. Her suprapubic cath was changed in the hospital and has follow up with urologist scheduled for later in December.

## 2015-11-09 NOTE — Assessment & Plan Note (Signed)
Doing well post op, apparently missed/needs to reschedule general surgery outpatient visit. Given phone number and information for her to call and reschedule. Surgical incisions appear well healing.

## 2015-11-09 NOTE — Assessment & Plan Note (Signed)
IVC filter now in place. Awaiting urologist visit to see if it is okay to restart xarelto.

## 2015-11-09 NOTE — Assessment & Plan Note (Signed)
Wrote order for SNF to give phenergan 1 hour prior to meals. Also add on zofran 8mg  ODT to be given between meals. Follow up 1 month to see if this regimen is improved. I did tell patient that she would be able to have the providers at SNF help manage this problem since they would be on site; if they needed to can call our clinic for discussion about plan of care.

## 2015-11-09 NOTE — Assessment & Plan Note (Signed)
Should be nearing end of oral vanc course. No complaints of diarrhea, she would actually prefer to restart the miralax since she says she hasn't been going (which may be partially attributed to her not eating much due to nausea). Wrote order for SNF to restart miralax and then add her dulcolax and senna later after finishing oral vanc.

## 2015-11-10 DIAGNOSIS — M6281 Muscle weakness (generalized): Secondary | ICD-10-CM | POA: Diagnosis not present

## 2015-11-10 DIAGNOSIS — M24561 Contracture, right knee: Secondary | ICD-10-CM | POA: Diagnosis not present

## 2015-11-10 DIAGNOSIS — A419 Sepsis, unspecified organism: Secondary | ICD-10-CM | POA: Diagnosis not present

## 2015-11-10 DIAGNOSIS — R293 Abnormal posture: Secondary | ICD-10-CM | POA: Diagnosis not present

## 2015-11-10 DIAGNOSIS — M24562 Contracture, left knee: Secondary | ICD-10-CM | POA: Diagnosis not present

## 2015-11-10 DIAGNOSIS — R2681 Unsteadiness on feet: Secondary | ICD-10-CM | POA: Diagnosis not present

## 2015-11-13 DIAGNOSIS — M24561 Contracture, right knee: Secondary | ICD-10-CM | POA: Diagnosis not present

## 2015-11-13 DIAGNOSIS — R293 Abnormal posture: Secondary | ICD-10-CM | POA: Diagnosis not present

## 2015-11-13 DIAGNOSIS — M24562 Contracture, left knee: Secondary | ICD-10-CM | POA: Diagnosis not present

## 2015-11-13 DIAGNOSIS — M6281 Muscle weakness (generalized): Secondary | ICD-10-CM | POA: Diagnosis not present

## 2015-11-13 DIAGNOSIS — F329 Major depressive disorder, single episode, unspecified: Secondary | ICD-10-CM | POA: Diagnosis not present

## 2015-11-13 DIAGNOSIS — A419 Sepsis, unspecified organism: Secondary | ICD-10-CM | POA: Diagnosis not present

## 2015-11-13 DIAGNOSIS — R2681 Unsteadiness on feet: Secondary | ICD-10-CM | POA: Diagnosis not present

## 2015-11-13 LAB — HEPATIC FUNCTION PANEL
ALT: 17 U/L (ref 7–35)
AST: 15 U/L (ref 13–35)
Alkaline Phosphatase: 133 U/L — AB (ref 25–125)
Bilirubin, Total: 0.4 mg/dL

## 2015-11-13 LAB — CBC AND DIFFERENTIAL
HCT: 32 % — AB (ref 36–46)
HEMOGLOBIN: 9.7 g/dL — AB (ref 12.0–16.0)
Platelets: 389 10*3/uL (ref 150–399)
WBC: 8.2 10*3/mL

## 2015-11-13 LAB — BASIC METABOLIC PANEL
BUN: 10 mg/dL (ref 4–21)
CREATININE: 0.5 mg/dL (ref 0.5–1.1)
Glucose: 110 mg/dL
POTASSIUM: 4.1 mmol/L (ref 3.4–5.3)
SODIUM: 140 mmol/L (ref 137–147)

## 2015-11-15 ENCOUNTER — Non-Acute Institutional Stay (SKILLED_NURSING_FACILITY): Payer: Medicare Other | Admitting: Internal Medicine

## 2015-11-15 DIAGNOSIS — N10 Acute tubulo-interstitial nephritis: Secondary | ICD-10-CM

## 2015-11-15 DIAGNOSIS — B9620 Unspecified Escherichia coli [E. coli] as the cause of diseases classified elsewhere: Secondary | ICD-10-CM | POA: Diagnosis not present

## 2015-11-15 DIAGNOSIS — N390 Urinary tract infection, site not specified: Secondary | ICD-10-CM

## 2015-11-15 DIAGNOSIS — A0472 Enterocolitis due to Clostridium difficile, not specified as recurrent: Secondary | ICD-10-CM

## 2015-11-15 DIAGNOSIS — A047 Enterocolitis due to Clostridium difficile: Secondary | ICD-10-CM

## 2015-11-20 ENCOUNTER — Ambulatory Visit: Payer: Medicare Other | Admitting: Family Medicine

## 2015-11-22 ENCOUNTER — Telehealth: Payer: Self-pay | Admitting: Neurology

## 2015-11-22 NOTE — Telephone Encounter (Signed)
Pt called to talk about her botox schedule.

## 2015-11-22 NOTE — Telephone Encounter (Signed)
Patient called back to speak with Duwayne Heckanielle

## 2015-11-23 NOTE — Telephone Encounter (Signed)
Spoke with patient and informed her that [facility] She spoke with Annabelle Harmanana C. On 11/21 and Annabelle HarmanDana had informed her of the referral but she missed her apt with them. I gave her their number and told her she could call back if she had any problems.

## 2015-11-23 NOTE — Progress Notes (Addendum)
Patient ID: [name] [name], female   DOB: [birth year], 39 y.o.   MRN: [id number]                PROGRESS NOTE  DATE:  11/15/2015          FAC[facility]: [facility]                    LEVEL OF CARE:   SNF   Acute Visit           CHIEF COMPLAINT:  Follow up dosing of vancomycin.    HISTORY OF PRESENT ILLNESS:  This is a patient who came here late in November after a protracted stay at [facility] from 10/08/2015 through 10/30/2015.    She has advanced cerebral palsy and is largely wheelchair-bound.    She was admitted with sepsis with hypotension.  She was found to have a multi-drug resistant E.coli UTI which was treated ultimately with Invanz. Her blood cult were negative.  It appears that she completed a 14-day course of antibiotics in the hospital.  Subsequently, a follow-up urine culture showed greater than 100,000 yeast and she completed a course of Diflucan, again in the hospital.  She also had acute cholecystitis, status post laparoscopic cholecystectomy.     At some point during the hospitalization, I think she was noted to have diarrhea.  A CT scan of the abdomen and pelvis done when she came in suggested constipation and fecal impaction.  Later on in the hospitalization, she developed diarrhea and she was C.diff positive.  She was supposed to come here with vancomycin orders for a further 10 days.  However, it appears that they are still continuing this.  The patient actually stopped me in the hallway to talk to me about this yesterday.  Not only is she not having diarrhea now, she is denying any bowel movements for four days.  She requests that her MiraLAX be restarted, previously at 17 g b.i.d.     PAST MEDICAL HISTORY/PROBLEM LIST:    Past Medical History  Diagnosis Date  . Cerebral palsy ([name])   . Pulmonary embolism ([name])     Lifetime Coumadin  . Cerebral palsy ([name])     Spastic Cerebral palsy, mentally intact  . Contracture, joint, multiple sites     Electric wheelchair,  uses left hand to operate chair.   [name] Kitchen Hypertension   . OCD (obsessive compulsive disorder)   . Depression   . Migraine   . [name]   . Dysarthria   . Endometriosis   . History of recurrent UTIs   . GERD (gastroesophageal reflux disease)   . Pulmonary embolism ([name]) 2011, 01/2011    Will be on lifetime coumadin  . Anemia   . Abdominal pain 01/02/2012    Overview:  Overview:  Per Previous pcp note- Dr. Ta--Pt has history of endometriosis and had DUB in 2011.  She She had u/s that showed normal endometrial stripe.  She had endometrial bx that was wnl.  She has a lot of abd cramping every month.  This cramping was sometimes not relieved with hydrocodone.  She was referred to [facility] for IUD placement to help endometriosis and abd cramping.  [name]  . Macroscopic hematuria 03/10/2013    status post replacement of suprapubic tube 03/04/2013   . HYPERTENSION, BENIGN 01/31/2010    Qualifier: Diagnosis of  By: [name], MD, [name] History of endometriosis 10/2012    OBGYN [facility]: Laparoscopic  Endometrial Ablation, TVH,  . Gram positive sepsis ([name]) 10/24/2014  . HCAP (healthcare-associated pneumonia) 10/25/2014  . Abdominal pain 01/02/2012    Overview:  Overview:  Per Previous pcp note- Dr. Ta--Pt has history of endometriosis and had DUB in 2011.  She She had u/s that showed normal endometrial stripe.  She had endometrial bx that was wnl.  She has a lot of abd cramping every month.  This cramping was sometimes not relieved with hydrocodone.  She was referred to [facility] for IUD placement to help endometriosis and abd cramping.  [name] was placed 04/30/11 by [facility].  Pt still complains of abd cramping, which I am treating with hydrocodone and ultram. Pt reports some initial relief of pain from IUD, but states it has now returned back to similar to previous cramping. Gave patient a shot of Toradol 60 mg IM today-since positive exacerbation of abdominal cramping during past  2 weeks. Physical exam reassuring. Patient may need to return to [facility] for further workup since she is plugged in with the GYN providers there for further treatment options regarding endometriosis and abdominal cramping. The dysfunctional uterine bleeding now well contr  . Acute respiratory failure with hypoxia ([name])   . Cellulitis 12/22/2014  . Disease of female genital organs 10/24/2010    Overview:  Overview:  Pt has history of endometriosis and had DUB in 2011.  She had u/s that showed normal endometrial stripe.  She had endometrial bx that was wnl.  She has a lot of abd cramping every month.  This cramping was sometimes not relieved with hydrocodone.  She was referred to [facility] for IUD placement to help endometriosis and abd cramping.  [name] was placed 04/30/11 by [facility].  Pt still complains of abd cramping, which was treated with hydrocodone and ultram.   Last seen at [facility] by OB/GYN 12/24/11, given Depo Lupron injection.  Ordering CT scan to r/o additional cause of abdominal pain (pt with known endometriosis and will not tolerate vaginal U/S, pain not improved s/p [name] or with Lupron injection-- only caused hot flashes and amenorrhea, no pain relief).  Also made referral to pain clinic for better pain control.  Do not think hysterectomy would help with pain, and concern for contamination of baclofen pump if did proceed with surgery.    8/5/13[name] OB/[facility]: Pt was seen by   . Dyspnea and respiratory abnormality 02/04/2011    Overview:  Overview:  Pt has history of recurrent PE and is on chronic coumadin.  Pt has been complaining of dyspnea that she describes as chest tightness.  She has been to the ER multiple times for this.  Usually she gets CTA and serial CE.  She has had at least 6 CTA in a period of since 2011.  She also has an O2 requirement. It is unclear why pt feels dyspneic, complains of chest tightness, or has O2 requirement.  I thought that her complaint may be from  deconditioning and referred her to PT.  Unfortunately Medicaid only pays for 4 PT visits.  Pt should be doing these exercises at home, but she has not.  I have referred her to [facility], and she has been seen by [name].  He did not understand the etiology of her dyspnea and O2 requirement.  He will continue to follow her.   Last Assessment & Plan:  Pt presents with concerns for chest tightness and dyspnea.  She was seen in ED yesterday and discharged home.  While there  she had a CT chest 04/20/11 showed 1.  Multifocal degradation as detailed above.  N  . Gross hematuria 03/12/2013    Overview:  Overview:  status post replacement of suprapubic tube 03/04/2013  Last Assessment & Plan:  Recent urine culture collected in ED on 5/4 did not result in significant growth. Moreover, patient has been asymptomatic with normal WBC, no fever and no true symptoms making symptomatic UTI less likely.  Will continue to monitor for any sign of infection.  Patient's xarelto had been held during her recent hospitalization due to hematuria. This has since been re-started. Monitor bleeding.    [name] Kitchen History of anticoagulant therapy 10/21/2012  . Infantile cerebral palsy ([name]) 01/29/2007    Overview:  She has spastic CP.  She is mentally intact.  She is wheelchair bound.  She is wheelchair bound for ambulation      . Intracervical pessary 05/07/2011    Overview:  Overview:  Placed by [facility] 04/30/11 to treat DUB and Endometriosis.  She is seen at GYN clinic at [facility] but was considered a poor surgical candidate and referred her to [facility].  For DUB she had pelvic ultrasound that showed thin stripe and she had endometrial Bx by Dr Jennette Kettle in GYN clinic, which was negative.     . Parapneumonic effusion 10/25/2014  . Presence of intrathecal baclofen pump 03/07/2014    R LQ. Insertion T10.    [name] Kitchen Psychiatric illness 10/31/2014  . Recurrent pulmonary embolism ([name]) 05/03/2011    Pt has history of recurrent PE and is on chronic coumadin.  Pt  has been complaining of dyspnea that she describes as chest tightness.  She has been to the ER multiple times for this.  Usually she gets CTA and serial CE.  She has had at least 6 CTA in a period of since 2011.  She also has an O2 requirement. It is unclear why pt feels dyspneic, complains of chest tightness, or has O2 requirement.  I thought that her complaint may be from deconditioning and referred her to PT.  Unfortunately Medicaid only pays for 4 PT visits.  Pt should be doing these exercises at home, but she has not.  I have referred her to [facility], and she has been seen by [name].  He did not understand the etiology of her dyspnea and O2 requirement.  He will continue to follow her.     . Seborrheic keratosis 01/18/2011  . Spasticity 01/05/2014  . Transient alteration of awareness, recurrent 09/08/2006    Recurrent episodes where Elia  loses contact with her world and that have been studied thoroughly and appear nonepileptic in nature per Dr Terrace Arabia (neurology) The patient has had episodes starting in 2007 that initially began 1-4 times a day during which time she would have periods of unawareness followed by confusion. This continuous video EEG monitoring performed from October 8-12, 2007. Had   . Acute cystitis without hematuria   . Acute cystitis with hematuria   . Urethra dilated and patulous 07/03/2015    Patulous, Dilated Urethra. 5mL balloon on a 22-Fr catheter hoping this would prevent the bladder spasm from pushing the balloon down the urethra (Dr Logan Bores, Urology [facility], July 2016)        CURRENT MEDICATIONS:  Medication list is reviewed.  Of the medications she is on, Prozac, ibuprofen p.r.n., oxycodone p.r.n., and the oral vancomycin itself can cause nausea and this is another current complaint she has.  Indeed, looking back through the  hospitalization, this is listed as a problem for which she was getting Phenergan a.c. meals.    Current Outpatient Prescriptions on File Prior to Visit    Medication Sig Dispense Refill  . acetaminophen (TYLENOL) 325 MG tablet Take 650 mg by mouth every 6 (six) hours as needed for moderate pain.     . ARIPiprazole (ABILIFY) 2 MG tablet Take 2 mg by mouth at bedtime.    . bacitracin-polymyxin b (POLYSPORIN) ointment Apply 1 g topically 2 (two) times daily. Apply around Suprapubic ostomy site    . baclofen (LIORESAL) 20 MG tablet Take 20 mg by mouth 4 (four) times daily. For bladder spasms    . bisacodyl (DULCOLAX) 10 MG suppository Place 10 mg rectally daily as needed for moderate constipation.    . clonazePAM (KLONOPIN) 1 MG tablet Take 1 tablet (1 mg total) by mouth 4 (four) times daily. 120 tablet 5  . Dimethicone (MOISTURE BARRIER EX) Apply 1 application topically as needed (for genitourinary).     . feeding supplement, ENSURE ENLIVE, (ENSURE ENLIVE) LIQD Take 237 mLs by mouth 2 (two) times daily between meals. 237 mL 12  . flavoxATE (URISPAS) 100 MG tablet Take 100 mg by mouth 3 (three) times daily as needed for bladder spasms.    [name] Kitchen FLUoxetine (PROZAC) 10 MG capsule Take 10 mg by mouth daily.    [name] Kitchen ibuprofen (ADVIL,MOTRIN) 200 MG tablet Take 400 mg by mouth every 8 (eight) hours as needed for moderate pain.     . magnesium hydroxide (MILK OF MAGNESIA) 400 MG/5ML suspension Take 30 mLs by mouth daily as needed for mild constipation.    . Multiple Vitamin (MULTIVITAMIN) tablet Take 1 tablet by mouth daily.    . ondansetron (ZOFRAN-ODT) 8 MG disintegrating tablet Take 1 tablet (8 mg total) by mouth every 8 (eight) hours as needed for nausea or vomiting. 30 tablet 0  . oxyCODONE (OXY IR/ROXICODONE) 5 MG immediate release tablet Take 1 tablet (5 mg total) by mouth every 4 (four) hours as needed for moderate pain or severe pain (may give 2 tablets every 4 hours for severe pain). 180 tablet 0  . promethazine (PHENERGAN) 25 MG tablet Take 1 tablet (25 mg total) by mouth 3 (three) times daily with meals as needed for nausea or vomiting. 30 tablet 0  .  QUEtiapine (SEROQUEL) 100 MG tablet Take 1 tablet (100 mg total) by mouth at bedtime.    . Sodium Phosphates (RA SALINE ENEMA) 19-7 GM/118ML ENEM Place 1 application rectally once.    [name] Kitchen tiZANidine (ZANAFLEX) 4 MG capsule Take 4 mg by mouth 2 (two) times daily at 8 am and 10 pm.        REVIEW OF SYSTEMS:   Difficult to comprehend due to dysarthria CHEST/RESPIRATORY:   She is not complaining of cough or shortness of breath.      CARDIAC:  No clear chest pain.   GI:  Nausea without true vomiting.   GU:  She is not having any suprapubic or flank pain (chronic Foley catheter).    Physical Exam General: she is in no apparent distress Abd: Soft. Somewhat distended. Non tender4 GU: suprapubic catherter no suprapubic or CVA tnderness.   ASSESSMENT/PLAN:                Pseudomembranous colitis.  The vancomycin should have been stopped 11 days ago and we can certainly stop that now.    Clearly an issue with regards to constipation/impaction historically in this patient.  It is not unreasonable to start her back on her MiraLAX 17 b.i.d. as she requests.    Normochromic, normocytic anemia.   Her hemoglobin that I did on 11/13/2015 was 9.7.  White count is 8.2.  Looking back through her records, it would appear that her hemoglobin is constantly in this range.  She had some fecal occult bloods in the hospital that were negative.  I did not see any iron studies.  She did have hypoalbuminemia, possible anemia of chronic disease.     Continuous complaints of nausea without true vomiting.  This may be medication-induced or have other intrinsic gastric causes.  I will need to check how many of the p.r.n. Motrins, oxycodones, etc., she is getting.     CPT CODE: [id number]

## 2015-11-24 ENCOUNTER — Ambulatory Visit (INDEPENDENT_AMBULATORY_CARE_PROVIDER_SITE_OTHER): Payer: Medicare Other | Admitting: Family Medicine

## 2015-11-24 VITALS — BP 116/71 | HR 110 | Temp 97.5°F

## 2015-11-24 DIAGNOSIS — K5901 Slow transit constipation: Secondary | ICD-10-CM | POA: Diagnosis present

## 2015-11-24 MED ORDER — POLYETHYLENE GLYCOL 3350 17 GM/SCOOP PO POWD: 17.0000 g | Freq: Two times a day (BID) | ORAL | Status: DC | PRN

## 2015-11-24 NOTE — Patient Instructions (Signed)
Thanks for coming in today.   You can take the miralax twice daily as needed. If this does not improve your constipation, then we will go up to four capfulls twice daily. Return to see us if your constipation is not improving.   You can stop taking the phenergan if you feel as though your nausea is better.   We are [facility] to [facility] if you would like to come back.   Thanks for letting us take care of you.   Sincerely,  Devota Pacealeb Marria Mathison, MD Family Medicine - PGY 2

## 2015-11-24 NOTE — Progress Notes (Signed)
Patient ID: [name] SlickerJudith Knoch, female   DOB: 1976/07/08, 39 y.o.   MRN: [id number]   [facility] Yolande Jollyaleb G Jamille Yoshino, MD Phone: [phone]  Subjective:   # Constipation.  - pt. Here because she says that she is becoming more constipated.  - She has a history of spastic Cerebral palsy and is chronically immobile. She is in a nursing facility for ongoing care. She had pseudomembranous colitis in November and required ongoing treatment with Vancomycin. She has completed this course, but now she is experiencing constipation with bowel movements occurring every 4-5 days.  - she had previously been on miralax twice daily in order to keep her bowel movements regular.  - She has not had nausea, vomiting, abdominal pain, or discomfort lately.  - No bright red blood in stool or melena. - she would like miralax twice daily to start and is ok with titration from there.   All relevant systems were reviewed and were negative unless otherwise noted in the HPI  Past Medical History Reviewed problem list.  Medications- reviewed and updated Current Outpatient Prescriptions  Medication Sig Dispense Refill  . acetaminophen (TYLENOL) 325 MG tablet Take 650 mg by mouth every 6 (six) hours as needed for moderate pain.     . ARIPiprazole (ABILIFY) 2 MG tablet Take 2 mg by mouth at bedtime.    . bacitracin-polymyxin b (POLYSPORIN) ointment Apply 1 g topically 2 (two) times daily. Apply around Suprapubic ostomy site    . baclofen (LIORESAL) 20 MG tablet Take 20 mg by mouth 4 (four) times daily. For bladder spasms    . bisacodyl (DULCOLAX) 10 MG suppository Place 10 mg rectally daily as needed for moderate constipation.    . clonazePAM (KLONOPIN) 1 MG tablet Take 1 tablet (1 mg total) by mouth 4 (four) times daily. 120 tablet 5  . Dimethicone (MOISTURE BARRIER EX) Apply 1 application topically as needed (for genitourinary).     . feeding supplement, ENSURE ENLIVE, (ENSURE ENLIVE) LIQD Take 237 mLs  by mouth 2 (two) times daily between meals. 237 mL 12  . flavoxATE (URISPAS) 100 MG tablet Take 100 mg by mouth 3 (three) times daily as needed for bladder spasms.    [name] Kitchen. FLUoxetine (PROZAC) 10 MG capsule Take 10 mg by mouth daily.    [name] Kitchen. ibuprofen (ADVIL,MOTRIN) 200 MG tablet Take 400 mg by mouth every 8 (eight) hours as needed for moderate pain.     . magnesium hydroxide (MILK OF MAGNESIA) 400 MG/5ML suspension Take 30 mLs by mouth daily as needed for mild constipation.    . Multiple Vitamin (MULTIVITAMIN) tablet Take 1 tablet by mouth daily.    . ondansetron (ZOFRAN-ODT) 8 MG disintegrating tablet Take 1 tablet (8 mg total) by mouth every 8 (eight) hours as needed for nausea or vomiting. 30 tablet 0  . oxyCODONE (OXY IR/ROXICODONE) 5 MG immediate release tablet Take 1 tablet (5 mg total) by mouth every 4 (four) hours as needed for moderate pain or severe pain (may give 2 tablets every 4 hours for severe pain). 180 tablet 0  . polyethylene glycol powder (GLYCOLAX/MIRALAX) powder Take 17 g by mouth 2 (two) times daily as needed. 3350 g 1  . promethazine (PHENERGAN) 25 MG tablet Take 1 tablet (25 mg total) by mouth 3 (three) times daily with meals as needed for nausea or vomiting. 30 tablet 0  . QUEtiapine (SEROQUEL) 100 MG tablet Take 1 tablet (100 mg total) by mouth at bedtime.    .[name] Kitchen  Sodium Phosphates (RA SALINE ENEMA) 19-7 GM/118ML ENEM Place 1 application rectally once.    [name] Kitchen tiZANidine (ZANAFLEX) 4 MG capsule Take 4 mg by mouth 2 (two) times daily at 8 am and 10 pm.     No current facility-administered medications for this visit.   Chief complaint-noted No additions to family history Social history- patient is a non smoker  Objective: BP 116/71 mmHg  Pulse 110  Temp(Src) 97.5 F (36.4 C) (Oral)  LMP 04/05/2011 Gen: NAD, alert, cooperative with exam HEENT: NCAT, EOMI, PERRL Neck: FROM, supple CV: RRR, good S1/S2, no murmur Resp: CTABL, no wheezes, non-labored Abd: SNTND, BS present, no  guarding or organomegaly, no masses.  Ext: No edema, warm, normal tone, moves UE/LE spontaneously Neuro: Alert and oriented, No gross deficits Skin: no rashes no lesions  Assessment/Plan:  # Constipation - pt. Has struggled with this in the past, and was on miralax prior to her C.Diff colitis. It appears that her bowels are returning to their normal function. Additionally, she is on chronic narcotic medications at her nursing facility and this could certainly contribute. I have no way to tell how much of this she is getting. I cautioned her that the narcotic medications could contribute. She needs to be on a bowel regimen as long as she is on these medications.  - Miralax Bid. Could titrate up to 4 cups BID if she remains constipated after initial trial.  - cautioned that if she develops nausea, vomiting, or abdominal pain then she should seek medical care.  - Return prn.  - She says that she would like to go back to [facility] eventually.

## 2015-12-05 DIAGNOSIS — Z95828 Presence of other vascular implants and grafts: Secondary | ICD-10-CM | POA: Diagnosis not present

## 2015-12-05 DIAGNOSIS — Z48812 Encounter for surgical aftercare following surgery on the circulatory system: Secondary | ICD-10-CM | POA: Diagnosis not present

## 2015-12-05 NOTE — Progress Notes (Signed)
CSW LVM for [facility] inquiring whether they have any Medicaid beds available. CSW also contacted pt to inform her. Pt states she is unsure as to whether she would want to [facility] but would like for CSW to call her back letting her know whether [name]. Theresia BoughNorma Betul Brisky, MSW, LCSW [phone]

## 2015-12-06 ENCOUNTER — Ambulatory Visit: Payer: Medicare Other | Admitting: Obstetrics and Gynecology

## 2015-12-06 NOTE — Progress Notes (Signed)
CSW LVM for pt informing her that [facility] currently does not have any long term Medicaid beds available.  Theresia BoughNorma Emori Kamau, MSW, LCSW [phone]

## 2015-12-07 DIAGNOSIS — F422 Mixed obsessional thoughts and acts: Secondary | ICD-10-CM | POA: Diagnosis not present

## 2015-12-07 DIAGNOSIS — F428 Other obsessive-compulsive disorder: Secondary | ICD-10-CM | POA: Diagnosis not present

## 2015-12-12 DIAGNOSIS — Z9359 Other cystostomy status: Secondary | ICD-10-CM | POA: Diagnosis not present

## 2015-12-21 NOTE — Telephone Encounter (Signed)
Patient had requested that I call and remind her of apt the day before she was scheduled so she did not miss her apt. I called her and reminded her that she was supposed to receive injection tomorrow at [facility].

## 2015-12-22 ENCOUNTER — Encounter: Payer: Self-pay | Admitting: Physical Medicine & Rehabilitation

## 2015-12-22 ENCOUNTER — Ambulatory Visit (HOSPITAL_BASED_OUTPATIENT_CLINIC_OR_DEPARTMENT_OTHER): Payer: Medicare Other | Admitting: Physical Medicine & Rehabilitation

## 2015-12-22 ENCOUNTER — Encounter: Payer: Medicare Other | Attending: Physical Medicine & Rehabilitation

## 2015-12-22 VITALS — BP 124/63 | HR 90 | Resp 14

## 2015-12-22 DIAGNOSIS — R252 Cramp and spasm: Secondary | ICD-10-CM | POA: Insufficient documentation

## 2015-12-22 DIAGNOSIS — G800 Spastic quadriplegic cerebral palsy: Secondary | ICD-10-CM | POA: Diagnosis not present

## 2015-12-22 NOTE — Patient Instructions (Signed)

## 2015-12-22 NOTE — Progress Notes (Signed)
Botox Injection for spasticity using needle EMG guidance  Dilution: 50 Units/ml Indication: Severe spasticity which interferes with ADL,mobility and/or  hygiene and is unresponsive to medication management and other conservative care Informed consent was obtained after describing risks and benefits of the procedure with the patient. This includes bleeding, bruising, infection, excessive weakness, or medication side effects. A REMS form is on file and signed. Needle: 27g 1" needle electrode Number of units per muscle  Biceps 25 L and 25 R FCR25 L and 50R  FDS25L and 50R FDP25L FPL25L Pronator 25L and 25R  All injections were done after obtaining appropriate EMG activity and after negative drawback for blood. The patient tolerated the procedure well. Post procedure instructions were given. A followup appointment was made.

## 2015-12-26 ENCOUNTER — Encounter: Payer: Medicare Other | Admitting: Pediatrics

## 2015-12-26 ENCOUNTER — Encounter: Payer: Self-pay | Admitting: Pediatrics

## 2015-12-26 ENCOUNTER — Ambulatory Visit (INDEPENDENT_AMBULATORY_CARE_PROVIDER_SITE_OTHER): Payer: Medicare Other | Admitting: Pediatrics

## 2015-12-26 DIAGNOSIS — R471 Dysarthria and anarthria: Secondary | ICD-10-CM

## 2015-12-26 DIAGNOSIS — G803 Athetoid cerebral palsy: Secondary | ICD-10-CM | POA: Diagnosis not present

## 2015-12-28 NOTE — Progress Notes (Signed)
Patient: [name] [name] MRN: [id number] Sex: female DOB: 05-03-1976  Provider: Deetta Perla, MD Location of Care: [facility]  Note type: Procedure visit  History of Present Illness: Referral Source: Melanie March, M.D. History from: patient and [facility] chart Chief Complaint: spastic/athetoid quadriparesis for Baclofen pump refill  [name] [name] is a 40 y.o. female who who returns for evaluation and management of spastic quadriparesis, and emptying, refilling, and reprogramming her intrathecal baclofen pump.  Procedure: emptying, refilling, and reprogramming her intrathecal baclofen pump.  The device was interrogated and showed a complex continuous infusion of baclofen that has a basal rate of 16.2 mcg per hour. The patient receives bolus doses of 27 mcg delivered at midnight, 6 AM, 12 noon, and 6 PM, for total of 465.8 mcg per day. Her reservoir alarm date is December 28, 2015, or 163 days from now. ERI 59 months.  The patient was sterilely prepped and draped. A 1-1/2 inch 22-gauge noncoring Huebner needle was inserted On the first pass. The apex of the reservoir is going horizontally to the left. 3.0 mL was withdrawn from the pump placing it under partial vacuum. 40 mL of baclofen (concentration 2000 mcg/mL) was instilled into the pump through a Millipore filter. She tolerated the procedure well.  Review of Systems: 12 system review was unremarkable except as noted below  Past Medical History Diagnosis Date  . Cerebral palsy ([name])   . Pulmonary embolism ([name])     Lifetime Coumadin  . Cerebral palsy ([name])     Spastic Cerebral palsy, mentally intact  . Contracture, joint, multiple sites     Electric wheelchair, uses left hand to operate chair.   [name] Kitchen Hypertension   . OCD (obsessive compulsive disorder)   . Depression   . Migraine   . [name]   . Dysarthria   . Endometriosis   . History of recurrent UTIs   . GERD  (gastroesophageal reflux disease)   . Pulmonary embolism ([name]) 2011, 01/2011    Will be on lifetime coumadin  . Anemia   . Abdominal pain 01/02/2012    Overview:  Overview:  Per Previous pcp note- Dr. Ta--Pt has history of endometriosis and had DUB in 2011.  She She had u/s that showed normal endometrial stripe.  She had endometrial bx that was wnl.  She has a lot of abd cramping every month.  This cramping was sometimes not relieved with hydrocodone.  She was referred to [facility] for IUD placement to help endometriosis and abd cramping.  [name]  . Macroscopic hematuria 03/10/2013    status post replacement of suprapubic tube 03/04/2013   . HYPERTENSION, BENIGN 01/31/2010    Qualifier: Diagnosis of  By: [name], MD, [name] History of endometriosis 10/2012    OBGYN [facility]: Laparoscopic Endometrial Ablation, TVH,  . Gram positive sepsis ([name]) 10/24/2014  . HCAP (healthcare-associated pneumonia) 10/25/2014  . Abdominal pain 01/02/2012    Overview:  Overview:  Per Previous pcp note- Dr. Ta--Pt has history of endometriosis and had DUB in 2011.  She She had u/s that showed normal endometrial stripe.  She had endometrial bx that was wnl.  She has a lot of abd cramping every month.  This cramping was sometimes not relieved with hydrocodone.  She was referred to [facility] for IUD placement to help endometriosis and abd cramping.  [name] was placed 04/30/11 by [facility].  Pt still complains of abd cramping, which I am treating with hydrocodone and  ultram. Pt reports some initial relief of pain from IUD, but states it has now returned back to similar to previous cramping. Gave patient a shot of Toradol 60 mg IM today-since positive exacerbation of abdominal cramping during past 2 weeks. Physical exam reassuring. Patient may need to return to [facility] for further workup since she is plugged in with the GYN providers there for further treatment options regarding endometriosis and abdominal cramping. The  dysfunctional uterine bleeding now well contr  . Acute respiratory failure with hypoxia ([name])   . Cellulitis 12/22/2014  . Disease of female genital organs 10/24/2010    Overview:  Overview:  Pt has history of endometriosis and had DUB in 2011.  She had u/s that showed normal endometrial stripe.  She had endometrial bx that was wnl.  She has a lot of abd cramping every month.  This cramping was sometimes not relieved with hydrocodone.  She was referred to [facility] for IUD placement to help endometriosis and abd cramping.  [name] was placed 04/30/11 by [facility].  Pt still complains of abd cramping, which was treated with hydrocodone and ultram.   Last seen at [facility] by OB/GYN 12/24/11, given Depo Lupron injection.  Ordering CT scan to r/o additional cause of abdominal pain (pt with known endometriosis and will not tolerate vaginal U/S, pain not improved s/p [name] or with Lupron injection-- only caused hot flashes and amenorrhea, no pain relief).  Also made referral to pain clinic for better pain control.  Do not think hysterectomy would help with pain, and concern for contamination of baclofen pump if did proceed with surgery.    8/5/13[name]: Pt was seen by   . Dyspnea and respiratory abnormality 02/04/2011    Overview:  Overview:  Pt has history of recurrent PE and is on chronic coumadin.  Pt has been complaining of dyspnea that she describes as chest tightness.  She has been to the ER multiple times for this.  Usually she gets CTA and serial CE.  She has had at least 6 CTA in a period of since 2011.  She also has an O2 requirement. It is unclear why pt feels dyspneic, complains of chest tightness, or has O2 requirement.  I thought that her complaint may be from deconditioning and referred her to PT.  Unfortunately Medicaid only pays for 4 PT visits.  Pt should be doing these exercises at home, but she has not.  I have referred her to [facility], and she has been seen by [name].  He did not  understand the etiology of her dyspnea and O2 requirement.  He will continue to follow her.   Last Assessment & Plan:  Pt presents with concerns for chest tightness and dyspnea.  She was seen in ED yesterday and discharged home.  While there she had a CT chest 04/20/11 showed 1.  Multifocal degradation as detailed above.  N  . Gross hematuria 03/12/2013    Overview:  Overview:  status post replacement of suprapubic tube 03/04/2013  Last Assessment & Plan:  Recent urine culture collected in ED on 5/4 did not result in significant growth. Moreover, patient has been asymptomatic with normal WBC, no fever and no true symptoms making symptomatic UTI less likely.  Will continue to monitor for any sign of infection.  Patient's xarelto had been held during her recent hospitalization due to hematuria. This has since been re-started. Monitor bleeding.    [name] Kitchen History of anticoagulant therapy 10/21/2012  . Infantile cerebral  palsy ([name]) 01/29/2007    Overview:  She has spastic CP.  She is mentally intact.  She is wheelchair bound.  She is wheelchair bound for ambulation      . Intracervical pessary 05/07/2011    Overview:  Overview:  Placed by [facility] GYN 04/30/11 to treat DUB and Endometriosis.  She is seen at GYN clinic at [facility] but was considered a poor surgical candidate and referred her to [facility].  For DUB she had pelvic ultrasound that showed thin stripe and she had endometrial Bx by Dr Jennette Kettle in GYN clinic, which was negative.     . Parapneumonic effusion 10/25/2014  . Presence of intrathecal baclofen pump 03/07/2014    R LQ. Insertion T10.    [name] Kitchen Psychiatric illness 10/31/2014  . Recurrent pulmonary embolism ([name]) 05/03/2011    Pt has history of recurrent PE and is on chronic coumadin.  Pt has been complaining of dyspnea that she describes as chest tightness.  She has been to the ER multiple times for this.  Usually she gets CTA and serial CE.  She has had at least 6 CTA in a period of since 2011.  She also has an O2  requirement. It is unclear why pt feels dyspneic, complains of chest tightness, or has O2 requirement.  I thought that her complaint may be from deconditioning and referred her to PT.  Unfortunately Medicaid only pays for 4 PT visits.  Pt should be doing these exercises at home, but she has not.  I have referred her to [facility], and she has been seen by [name].  He did not understand the etiology of her dyspnea and O2 requirement.  He will continue to follow her.     . Seborrheic keratosis 01/18/2011  . Spasticity 01/05/2014  . Transient alteration of awareness, recurrent 09/08/2006    Recurrent episodes where Kaelyn  loses contact with her world and that have been studied thoroughly and appear nonepileptic in nature per Dr Terrace Arabia (neurology) The patient has had episodes starting in 2007 that initially began 1-4 times a day during which time she would have periods of unawareness followed by confusion. This continuous video EEG monitoring performed from October 8-12, 2007. Had   . Acute cystitis without hematuria   . Acute cystitis with hematuria   . Urethra dilated and patulous 07/03/2015    Patulous, Dilated Urethra. 5mL balloon on a 22-Fr catheter hoping this would prevent the bladder spasm from pushing the balloon down the urethra (Dr Logan Bores, Urology [facility], July 2016)    Hospitalizations: Yes.  , Head Injury: No., Nervous System Infections: No., Immunizations up to date: Yes.    Birth History Extremely premature infant  Behavior History anxiety, depression, nonepileptic loss of consciousness  Surgical History Procedure Laterality Date  . Colposcopy  06/2000  . Cesarean section      x 2  . Tubal ligation  2003  . Carpal tunnel release  08/2008    Dr Teressa Senter  . Wrist surgery  06/2010    Dr Dierdre Searles, hand surgeon, [name]  . Baclofen pump refill      x 3 times  . Appendectomy    . Intrauterine device insertion  04/30/11    Inserted by [facility] GYN for endometriosis  . Laparoscopically assisted  vag hysterectomy  10/27/2012  . Multiple extractions with alveoloplasty Bilateral 07/19/2013    Procedure: EXTRACTIONS #4, 8,65,78,46;  Surgeon: Francene Finders, DDS;  Location: [facility] OR;  Service: Oral Surgery;  Laterality:  Bilateral;  . Pain pump implantation N/A 03/07/2014    Procedure: baclofen pump revision/replacement and Catheter connection replacement;  Surgeon: Cristi Loron, MD;  Location: [facility];  Service: Neurosurgery;  Laterality: N/A;  baclofen pump revision/replacement and Catheter connection replacement  . Programable baclofen pump revision  03/17/14    Battery Replacement   . Cholecystectomy  10/14/2015    Procedure: LAPAROSCOPIC CHOLECYSTECTOMY;  Surgeon: Violeta Gelinas, MD;  Location: [facility] OR;  Service: General;;   Family History family history includes Alzheimer's disease in her paternal grandfather; Asthma in her father; Breast cancer in her paternal grandmother; Cancer in her paternal grandmother; Colon cancer in her maternal grandmother; Heart attack in her maternal grandfather. Family history is negative for migraines, seizures, intellectual disabilities, blindness, deafness, birth defects, chromosomal disorder, or autism.  Social History . Marital Status: Divorced    Spouse Name: N/A  . Number of Children: 2  . Years of Education: college   Occupational History  . UNEMPLOYED    Social History Main Topics  . Smoking status: Never Smoker   . Smokeless tobacco: Never Used  . Alcohol Use: 0.6 oz/week    1 Glasses of wine per week     Comment: Drinks alcohol once a month.  . Drug Use: No  . Sexual Activity: No    Social History Narrative    Ailene Ards, who has schizophrenia, MR, is father of daughter.  Homero Fellers and pt are no longer together.  Homero Fellers does not see [name]. Daughter is 59 yo, Stage manager, lives with pt.    [name] [phone]), lives with pt's parents.    Needs assistance with ADL's and IADL's--has personal care services 20 hrs/wk;     No tobacco,  EtOH, drugs.    **Patient very concerned about her kids being taken from her.    **Case Manager: Jack Quarto [phone]    Patient has moved into a new skilled nursing facility   Allergies Allergen Reactions  . Morphine Dermatitis and Other (See Comments)    Skin turned red   Physical Exam LMP 04/05/2011  Quadriparesis with spasticity and dystonia, unchanged  Assessment 1. Athetoid cervical palsy, G80.3. 2. Dystonia, G24.9. 3. Transient alteration of awareness, recurrent, R40.4  Plan Successfully emptied refilled and reprogrammed intrathecal baclofen pump. She will return before June 06, 2016 empty refill and reprogram her pump.   Medication List   This list is accurate as of: 12/26/15 11:59 PM.       acetaminophen 325 MG tablet  Commonly known as:  TYLENOL  Take 650 mg by mouth every 6 (six) hours as needed for moderate pain.     ARIPiprazole 2 MG tablet  Commonly known as:  ABILIFY  Take 2 mg by mouth at bedtime.     bacitracin-polymyxin b ointment  Commonly known as:  POLYSPORIN  Apply 1 g topically 2 (two) times daily. Apply around Suprapubic ostomy site     baclofen 20 MG tablet  Commonly known as:  LIORESAL  Take 20 mg by mouth 4 (four) times daily. For bladder spasms     bisacodyl 10 MG suppository  Commonly known as:  DULCOLAX  Place 10 mg rectally daily as needed for moderate constipation.     clonazePAM 1 MG tablet  Commonly known as:  KLONOPIN  Take 1 tablet (1 mg total) by mouth 4 (four) times daily.     feeding supplement (ENSURE ENLIVE) Liqd  Take 237 mLs by mouth 2 (two) times daily between meals.  flavoxATE 100 MG tablet  Commonly known as:  URISPAS  Take 100 mg by mouth 3 (three) times daily as needed for bladder spasms.     FLUoxetine 10 MG capsule  Commonly known as:  PROZAC  Take 10 mg by mouth daily.     ibuprofen 200 MG tablet  Commonly known as:  ADVIL,MOTRIN  Take 400 mg by mouth every 8 (eight) hours as needed for  moderate pain.     magnesium hydroxide 400 MG/5ML suspension  Commonly known as:  MILK OF MAGNESIA  Take 30 mLs by mouth daily as needed for mild constipation.     MOISTURE BARRIER EX  Apply 1 application topically as needed (for genitourinary).     multivitamin tablet  Take 1 tablet by mouth daily.     ondansetron 8 MG disintegrating tablet  Commonly known as:  ZOFRAN-ODT  Take 1 tablet (8 mg total) by mouth every 8 (eight) hours as needed for nausea or vomiting.     oxyCODONE 5 MG immediate release tablet  Commonly known as:  Oxy IR/ROXICODONE  Take 1 tablet (5 mg total) by mouth every 4 (four) hours as needed for moderate pain or severe pain (may give 2 tablets every 4 hours for severe pain).     polyethylene glycol powder powder  Commonly known as:  GLYCOLAX/MIRALAX  Take 17 g by mouth 2 (two) times daily as needed.     promethazine 25 MG tablet  Commonly known as:  PHENERGAN  Take 1 tablet (25 mg total) by mouth 3 (three) times daily with meals as needed for nausea or vomiting.     QUEtiapine 100 MG tablet  Commonly known as:  SEROQUEL  Take 1 tablet (100 mg total) by mouth at bedtime.     RA SALINE ENEMA 19-7 GM/118ML Enem  Place 1 application rectally once.     tiZANidine 4 MG capsule  Commonly known as:  ZANAFLEX  Take 4 mg by mouth 2 (two) times daily at 8 am and 10 pm.      The medication list was reviewed and reconciled. All changes or newly prescribed medications were explained.  A complete medication list was provided to the patient/caregiver.  Deetta Perla MD

## 2016-01-08 ENCOUNTER — Encounter: Payer: Self-pay | Admitting: Obstetrics and Gynecology

## 2016-01-08 ENCOUNTER — Ambulatory Visit (INDEPENDENT_AMBULATORY_CARE_PROVIDER_SITE_OTHER): Payer: Medicare Other | Admitting: Obstetrics and Gynecology

## 2016-01-08 VITALS — BP 126/83 | HR 127 | Temp 98.2°F | Wt 132.0 lb

## 2016-01-08 DIAGNOSIS — K5900 Constipation, unspecified: Secondary | ICD-10-CM

## 2016-01-08 DIAGNOSIS — R109 Unspecified abdominal pain: Secondary | ICD-10-CM | POA: Diagnosis not present

## 2016-01-08 DIAGNOSIS — K5909 Other constipation: Secondary | ICD-10-CM

## 2016-01-08 DIAGNOSIS — M7989 Other specified soft tissue disorders: Secondary | ICD-10-CM | POA: Diagnosis not present

## 2016-01-08 DIAGNOSIS — Z593 Problems related to living in residential institution: Secondary | ICD-10-CM | POA: Diagnosis not present

## 2016-01-08 MED ORDER — DOCUSATE SODIUM 100 MG PO CAPS: 100.0000 mg | ORAL_CAPSULE | Freq: Every day | ORAL | Status: DC | PRN

## 2016-01-08 NOTE — Progress Notes (Addendum)
     Subjective: Chief Complaint  Patient presents with  . medicine review     HPI: [name] [name] is a 40 y.o. presenting to clinic today to discuss the following:  #Stomach Pain -states she has had off and on stomach pain for about a week -denies any pain currently -appetite is stable -she is unsure of what may be causing this  -denies any concerns about her suprapubic catheter -denies fevers, chills, dysuria, odor, hematuria.   #Bowel regimen -recently seen in office for constipation -was put on miralax bid -patient wants something other than miralax because she states it is making hr go to the bathroom too much to were she has accidents on herself -not taking any stool softeners -on chronic narcotics  #Leg swelling -legs have been swelling and thinks she is retaining fluid -denies any leg pain or erythema -denies swelling elsewhere   PMH - cerebral palsy, dystonia, chronic pain syndrome  ROS noted in HPI.  Past Medical, Surgical, Social, and Family History Reviewed & Updated per EMR.   Objective: BP 126/83 mmHg  Pulse 127  Temp(Src) 98.2 F (36.8 C) (Oral)  Wt 132 lb (59.875 kg)  LMP 04/05/2011 Vitals and nursing notes reviewed  Physical Exam  General: alert, chronically ill, leaning to the right side in her wheelchair limited movement, well nourished, in no acute distress Respiratory: CTAB Cardiovascular: tachycardia, pulses are normal Musculoskeletal: Contractures at both wrists, trace edema in bilateral legs Skin: no rashes or neurocutaneous lesions Abdomen: She has a suprapubic catheter to drain her bladder Neurologic: A&O, Spastic dystonic quadriparesis, Wheelchair-bound  Assessment/Plan: Please see problem based Assessment and Plan   Orders Placed This Encounter  Procedures  . Compression stockings    Meds ordered this encounter  Medications  . DISCONTD: docusate sodium (COLACE) 100 MG capsule    Sig: Take 1 capsule (100 mg total) by mouth  daily as needed for mild constipation.    Dispense:  30 capsule    Refill:  0  . docusate sodium (COLACE) 100 MG capsule    Sig: Take 1 capsule (100 mg total) by mouth daily as needed for mild constipation.    Dispense:  30 capsule    Refill:  1    Caryl Ada, DO 01/08/2016, 2:31 PM PGY-2, [facility]

## 2016-01-08 NOTE — Patient Instructions (Signed)
Constipation, Adult Constipation is when a person:  Poops (has a bowel movement) less than 3 times a week.  Has a hard time pooping.  Has poop that is dry, hard, or bigger than normal. HOME CARE   Eat foods with a lot of fiber in them. This includes fruits, vegetables, beans, and whole grains such as brown rice.  Avoid fatty foods and foods with a lot of sugar. This includes french fries, hamburgers, cookies, candy, and soda.  If you are not getting enough fiber from food, take products with added fiber in them (supplements).  Drink enough fluid to keep your pee (urine) clear or pale yellow.  Exercise on a regular basis, or as told by your doctor.  Go to the restroom when you feel like you need to poop. Do not hold it.  Only take medicine as told by your doctor. Do not take medicines that help you poop (laxatives) without talking to your doctor first. GET HELP RIGHT AWAY IF:   You have bright red blood in your poop (stool).  Your constipation lasts more than 4 days or gets worse.  You have belly (abdominal) or butt (rectal) pain.  You have thin poop (as thin as a pencil).  You lose weight, and it cannot be explained. MAKE SURE YOU:   Understand these instructions.  Will watch your condition.  Will get help right away if you are not doing well or get worse.   This information is not intended to replace advice given to you by your health care provider. Make sure you discuss any questions you have with your health care provider.   Document Released: 05/06/2008 Document Revised: 12/09/2014 Document Reviewed: 08/30/2013 Elsevier Interactive Patient Education 2016 Elsevier Inc.  

## 2016-01-09 DIAGNOSIS — F422 Mixed obsessional thoughts and acts: Secondary | ICD-10-CM | POA: Diagnosis not present

## 2016-01-09 DIAGNOSIS — F428 Other obsessive-compulsive disorder: Secondary | ICD-10-CM | POA: Diagnosis not present

## 2016-01-11 DIAGNOSIS — F339 Major depressive disorder, recurrent, unspecified: Secondary | ICD-10-CM | POA: Diagnosis not present

## 2016-01-11 DIAGNOSIS — F39 Unspecified mood [affective] disorder: Secondary | ICD-10-CM | POA: Diagnosis not present

## 2016-01-11 DIAGNOSIS — F411 Generalized anxiety disorder: Secondary | ICD-10-CM | POA: Diagnosis not present

## 2016-01-13 DIAGNOSIS — M7989 Other specified soft tissue disorders: Secondary | ICD-10-CM | POA: Insufficient documentation

## 2016-01-13 NOTE — Assessment & Plan Note (Signed)
Bilateral legs with trace edema. No signs of DVT. No erythema or warmth. Patient unable to elevate her feet without help. Believe this is dependent edema or mild venous insufficiency. Discussed with patient need to elevate feet when not in wheelchair. Ordered compression stockings for her to use while in wheelchair to help with swelling.

## 2016-01-13 NOTE — Assessment & Plan Note (Signed)
No longer having issues with constipation. Now having too many stools.Believe some of her abdominal pain may be related to this. Discussed with patient how to taper Miralax to her needs. Prescribed daily colace for patient to keep stools soft with her chronic pain medicines. Miralax to be used prn when need a bowel movement.

## 2016-01-13 NOTE — Assessment & Plan Note (Signed)
Used to be a [facility] patient but after last hospitalization lost her bed. She now resides in [facility].

## 2016-01-15 DIAGNOSIS — Z9359 Other cystostomy status: Secondary | ICD-10-CM | POA: Diagnosis not present

## 2016-01-17 DIAGNOSIS — F331 Major depressive disorder, recurrent, moderate: Secondary | ICD-10-CM | POA: Diagnosis not present

## 2016-01-17 DIAGNOSIS — F428 Other obsessive-compulsive disorder: Secondary | ICD-10-CM | POA: Diagnosis not present

## 2016-01-29 ENCOUNTER — Non-Acute Institutional Stay (SKILLED_NURSING_FACILITY): Payer: Medicare Other | Admitting: Nurse Practitioner

## 2016-01-29 ENCOUNTER — Encounter: Payer: Self-pay | Admitting: Nurse Practitioner

## 2016-01-29 DIAGNOSIS — G894 Chronic pain syndrome: Secondary | ICD-10-CM

## 2016-01-29 DIAGNOSIS — R600 Localized edema: Secondary | ICD-10-CM

## 2016-01-29 DIAGNOSIS — K5901 Slow transit constipation: Secondary | ICD-10-CM | POA: Diagnosis not present

## 2016-01-29 DIAGNOSIS — G801 Spastic diplegic cerebral palsy: Secondary | ICD-10-CM | POA: Diagnosis not present

## 2016-01-29 DIAGNOSIS — F331 Major depressive disorder, recurrent, moderate: Secondary | ICD-10-CM

## 2016-01-29 DIAGNOSIS — I10 Essential (primary) hypertension: Secondary | ICD-10-CM | POA: Diagnosis not present

## 2016-01-29 NOTE — Progress Notes (Signed)
Patient ID: [name] [name], female   DOB: 1976-04-01, 40 y.o.   MRN: [id number]  Location:  [facility] Room Number: 413 B Place of Service:  SNF [phone]) Provider:   Caryl Ada, DO  Patient Care Team: Pincus Large, DO as PCP - General (Family Medicine) Jamison Neighbor, MD as Consulting Physician (Urology) Deetta Perla, MD as Consulting Physician (Neurology) Glori Luis, MD (Family Medicine)  Extended Emergency Contact Information Primary Emergency Contact: Gorter,[name] Address: [address], [place] Home Phone: [phone] Relation: Father Secondary Emergency Contact: Melanie Crazier [place] Home Phone: [phone] Relation: Friend  Code Status:  Full Code Goals of care: Advanced Directive information Advanced Directives 01/29/2016  Does patient have an advance directive? No  Would patient like information on creating an advanced directive? No - patient declined information     Chief Complaint  Patient presents with  . Medical Management of Chronic Issues    Routine Visit    HPI:  Pt is a 40 y.o. female seen today for medical management of chronic diseases.  Pt with hx of CP, neurogenic bladder with chronic suprapubic foley  HTN, chronic pain, chronic PE, depression, OCD. Pt has been doing well in the last month. Recently seen family practice due to swelling in LE and constipation.  Mild swelling to LE, would like compression hose.  Constipation has improved, bowels moving well.    Past Medical History  Diagnosis Date  . Cerebral palsy ([name])   . Pulmonary embolism ([name])     Lifetime Coumadin  . Cerebral palsy ([name])     Spastic Cerebral palsy, mentally intact  . Contracture, joint, multiple sites     Electric wheelchair, uses left hand to operate chair.   [name] Kitchen Hypertension   . OCD (obsessive compulsive disorder)   . Depression   . Migraine   . DJD (degenerative joint  disease)   . Dysarthria   . Endometriosis   . History of recurrent UTIs   . GERD (gastroesophageal reflux disease)   . Pulmonary embolism ([name]) 2011, 01/2011    Will be on lifetime coumadin  . Anemia   . Abdominal pain 01/02/2012    Overview:  Overview:  Per Previous pcp note- Dr. Ta--Pt has history of endometriosis and had DUB in 2011.  She She had u/s that showed normal endometrial stripe.  She had endometrial bx that was wnl.  She has a lot of abd cramping every month.  This cramping was sometimes not relieved with hydrocodone.  She was referred to [facility] for IUD placement to help endometriosis and abd cramping.  [name]  . Macroscopic hematuria 03/10/2013    status post replacement of suprapubic tube 03/04/2013   . HYPERTENSION, BENIGN 01/31/2010    Qualifier: Diagnosis of  By: [name], MD, [name] History of endometriosis 10/2012    OBGYN [facility]: Laparoscopic Endometrial Ablation, TVH,  . Gram positive sepsis ([name]) 10/24/2014  . HCAP (healthcare-associated pneumonia) 10/25/2014  . Abdominal pain 01/02/2012    Overview:  Overview:  Per Previous pcp note- Dr. Ta--Pt has history of endometriosis and had DUB in 2011.  She She had u/s that showed normal endometrial stripe.  She had endometrial bx that was wnl.  She has a lot of abd cramping every month.  This cramping was sometimes not relieved with hydrocodone.  She was referred to [facility]  GYN for IUD placement to help endometriosis and abd cramping.  [name] was placed 04/30/11 by [facility].  Pt still complains of abd cramping, which I am treating with hydrocodone and ultram. Pt reports some initial relief of pain from IUD, but states it has now returned back to similar to previous cramping. Gave patient a shot of Toradol 60 mg IM today-since positive exacerbation of abdominal cramping during past 2 weeks. Physical exam reassuring. Patient may need to [facility] further workup since she is plugged in with the GYN providers there  for further treatment options regarding endometriosis and abdominal cramping. The dysfunctional uterine bleeding now well contr  . Acute respiratory failure with hypoxia ([name])   . Cellulitis 12/22/2014  . Disease of female genital organs 10/24/2010    Overview:  Overview:  Pt has history of endometriosis and had DUB in 2011.  She had u/s that showed normal endometrial stripe.  She had endometrial bx that was wnl.  She has a lot of abd cramping every month.  This cramping was sometimes not relieved with hydrocodone.  She was referred to [facility] for IUD placement to help endometriosis and abd cramping.  [name] was placed 04/30/11 by [facility].  Pt still complains of abd cramping, which was treated with hydrocodone and ultram.   Last seen at [facility] by OB/GYN 12/24/11, given Depo Lupron injection.  Ordering CT scan to r/o additional cause of abdominal pain (pt with known endometriosis and will not tolerate vaginal U/S, pain not improved s/p [name] or with Lupron injection-- only caused hot flashes and amenorrhea, no pain relief).  Also made referral to pain clinic for better pain control.  Do not think hysterectomy would help with pain, and concern for contamination of baclofen pump if did proceed with surgery.    8/[facility]/13[name]: Pt was seen by   . Dyspnea and respiratory abnormality 02/04/2011    Overview:  Overview:  Pt has history of recurrent PE and is on chronic coumadin.  Pt has been complaining of dyspnea that she describes as chest tightness.  She has been to the ER multiple times for this.  Usually she gets CTA and serial CE.  She has had at least 6 CTA in a period of since 2011.  She also has an O2 requirement. It is unclear why pt feels dyspneic, complains of chest tightness, or has O2 requirement.  I thought that her complaint may be from deconditioning and referred her to PT.  Unfortunately Medicaid only pays for 4 PT visits.  Pt should be doing these exercises at home, but she has not.  I have  referred her to [facility], and she has been seen by [name].  He did not understand the etiology of her dyspnea and O2 requirement.  He will continue to follow her.   Last Assessment & Plan:  Pt presents with concerns for chest tightness and dyspnea.  She was seen in ED yesterday and discharged home.  While there she had a CT chest 04/20/11 showed 1.  Multifocal degradation as detailed above.  N  . Gross hematuria 03/12/2013    Overview:  Overview:  status post replacement of suprapubic tube 03/04/2013  Last Assessment & Plan:  Recent urine culture collected in ED on 5/4 did not result in significant growth. Moreover, patient has been asymptomatic with normal WBC, no fever and no true symptoms making symptomatic UTI less likely.  Will continue to monitor for any sign of infection.  Patient's  xarelto had been held during her recent hospitalization due to hematuria. This has since been re-started. Monitor bleeding.    [name] Kitchen History of anticoagulant therapy 10/21/2012  . Infantile cerebral palsy ([name]) 01/29/2007    Overview:  She has spastic CP.  She is mentally intact.  She is wheelchair bound.  She is wheelchair bound for ambulation      . Intracervical pessary 05/07/2011    Overview:  Overview:  Placed by [facility] 04/30/11 to treat DUB and Endometriosis.  She is seen at GYN clinic at [facility] but was considered a poor surgical candidate and referred her to [facility].  For DUB she had pelvic ultrasound that showed thin stripe and she had endometrial Bx by Dr Jennette Kettle in GYN clinic, which was negative.     . Parapneumonic effusion 10/25/2014  . Presence of intrathecal baclofen pump 03/07/2014    R LQ. Insertion T10.    [name] Kitchen Psychiatric illness 10/31/2014  . Recurrent pulmonary embolism ([name]) 05/03/2011    Pt has history of recurrent PE and is on chronic coumadin.  Pt has been complaining of dyspnea that she describes as chest tightness.  She has been to the ER multiple times for this.  Usually she gets CTA and serial CE.   She has had at least 6 CTA in a period of since 2011.  She also has an O2 requirement. It is unclear why pt feels dyspneic, complains of chest tightness, or has O2 requirement.  I thought that her complaint may be from deconditioning and referred her to PT.  Unfortunately Medicaid only pays for 4 PT visits.  Pt should be doing these exercises at home, but she has not.  I have referred her to [facility], and she has been seen by [name].  He did not understand the etiology of her dyspnea and O2 requirement.  He will continue to follow her.     . Seborrheic keratosis 01/18/2011  . Spasticity 01/05/2014  . Transient alteration of awareness, recurrent 09/08/2006    Recurrent episodes where Malky  loses contact with her world and that have been studied thoroughly and appear nonepileptic in nature per Dr Terrace Arabia (neurology) The patient has had episodes starting in 2007 that initially began 1-4 times a day during which time she would have periods of unawareness followed by confusion. This continuous video EEG monitoring performed from October 8-12, 2007. Had   . Acute cystitis without hematuria   . Acute cystitis with hematuria   . Urethra dilated and patulous 07/03/2015    Patulous, Dilated Urethra. 5mL balloon on a 22-Fr catheter hoping this would prevent the bladder spasm from pushing the balloon down the urethra (Dr Logan Bores, Urology [facility], July 2016)    Past Surgical History  Procedure Laterality Date  . Colposcopy  06/2000  . Cesarean section      x 2  . Tubal ligation  2003  . Carpal tunnel release  08/2008    Dr Teressa Senter  . Wrist surgery  06/2010    Dr Dierdre Searles, hand surgeon, [name]  . Baclofen pump refill      x 3 times  . Appendectomy    . Intrauterine device insertion  04/30/11    Inserted by [facility] for endometriosis  . Laparoscopically assisted vag hysterectomy  10/27/2012  . Multiple extractions with alveoloplasty Bilateral 07/19/2013    Procedure: EXTRACTIONS #4, 1,61,09,60;  Surgeon:  Francene Finders, DDS;  Location: [facility] OR;  Service: Oral Surgery;  Laterality: Bilateral;  .  Pain pump implantation N/A 03/07/2014    Procedure: baclofen pump revision/replacement and Catheter connection replacement;  Surgeon: Cristi Loron, MD;  Location: [facility];  Service: Neurosurgery;  Laterality: N/A;  baclofen pump revision/replacement and Catheter connection replacement  . Programable baclofen pump revision  03/17/14    Battery Replacement   . Cholecystectomy  10/14/2015    Procedure: LAPAROSCOPIC CHOLECYSTECTOMY;  Surgeon: Violeta Gelinas, MD;  Location: [facility] OR;  Service: General;;    Allergies  Allergen Reactions  . Morphine Dermatitis and Other (See Comments)    Skin turned red      Medication List       This list is accurate as of: 01/29/16  3:37 PM.  Always use your most recent med list.               acetaminophen 325 MG tablet  Commonly known as:  TYLENOL  Take 650 mg by mouth every 6 (six) hours as needed for moderate pain.     amantadine 100 MG capsule  Commonly known as:  SYMMETREL  Take 100 mg by mouth daily.     baclofen 20 MG tablet  Commonly known as:  LIORESAL  Take 20 mg by mouth 4 (four) times daily. For bladder spasms     bisacodyl 10 MG suppository  Commonly known as:  DULCOLAX  Place 10 mg rectally daily as needed for moderate constipation.     clonazePAM 0.5 MG tablet  Commonly known as:  KLONOPIN  Take 0.5 mg by mouth 2 (two) times daily.     docusate sodium 100 MG capsule  Commonly known as:  COLACE  Take 1 capsule (100 mg total) by mouth daily as needed for mild constipation.     feeding supplement (ENSURE ENLIVE) Liqd  Take 237 mLs by mouth 2 (two) times daily between meals.     flavoxATE 100 MG tablet  Commonly known as:  URISPAS  Take 100 mg by mouth 3 (three) times daily as needed for bladder spasms.     FLUoxetine 20 MG tablet  Commonly known as:  PROZAC  Take 20 mg by mouth daily.     ibuprofen 200 MG tablet    Commonly known as:  ADVIL,MOTRIN  Take 400 mg by mouth every 8 (eight) hours as needed for moderate pain.     lamoTRIgine 100 MG tablet  Commonly known as:  LAMICTAL  Take 100 mg by mouth at bedtime.     magnesium hydroxide 400 MG/5ML suspension  Commonly known as:  MILK OF MAGNESIA  Take 30 mLs by mouth daily as needed for mild constipation.     multivitamin tablet  Take 1 tablet by mouth daily.     ondansetron 8 MG disintegrating tablet  Commonly known as:  ZOFRAN-ODT  Take 1 tablet (8 mg total) by mouth every 8 (eight) hours as needed for nausea or vomiting.     oxyCODONE 5 MG immediate release tablet  Commonly known as:  Oxy IR/ROXICODONE  Take 1 tablet (5 mg total) by mouth every 4 (four) hours as needed for moderate pain or severe pain (may give 2 tablets every 4 hours for severe pain).     promethazine 12.5 MG tablet  Commonly known as:  PHENERGAN  Take 12.5 mg by mouth every 6 (six) hours as needed for nausea or vomiting.     QUEtiapine 100 MG tablet  Commonly known as:  SEROQUEL  Take 1 tablet (100 mg total) by mouth at bedtime.  tiZANidine 4 MG capsule  Commonly known as:  ZANAFLEX  Take 4 mg by mouth 2 (two) times daily at 8 am and 10 pm.        Review of Systems  Constitutional: Negative for activity change, appetite change, fatigue and unexpected weight change.  HENT: Negative for congestion and hearing loss.   Eyes: Negative.   Respiratory: Negative for shortness of breath.   Cardiovascular: Positive for leg swelling. Negative for chest pain.  Gastrointestinal: Negative for abdominal pain, diarrhea and constipation.  Genitourinary:       Suprapubic  Musculoskeletal: Negative for myalgias and arthralgias.       Controlled on current regimen  Neurological: Negative for dizziness and weakness.  Psychiatric/Behavioral: Negative for behavioral problems, confusion and agitation. The patient is nervous/anxious.     Immunization History  Administered  Date(s) Administered  . H1N1 11/04/2008  . Influenza Whole 11/09/2008, 10/05/2009  . Influenza,inj,Quad PF,36+ Mos 10/24/2014  . Influenza-Unspecified 09/13/2015  . Pneumococcal Polysaccharide-23 10/24/2014  . Td 12/02/1994, 03/18/2008   Pertinent  Health Maintenance Due  Topic Date Due  . PAP SMEAR  11/12/2013  . INFLUENZA VACCINE  07/02/2016   Fall Risk  12/22/2015  Falls in the past year? Exclusion - non ambulatory   Functional Status Survey:    Filed Vitals:   01/29/16 1459  BP: 132/58  Pulse: 60  Temp: 98.5 F (36.9 C)  TempSrc: Oral  Resp: 18  Height: 5' (1.524 m)  Weight: 136 lb (61.689 kg)   Body mass index is 26.56 kg/(m^2). Physical Exam  Constitutional: She is oriented to person, place, and time. She appears well-developed and well-nourished. No distress.  HENT:  Head: Normocephalic and atraumatic.  Mouth/Throat: Oropharynx is clear and moist. No oropharyngeal exudate.  Eyes: Conjunctivae are normal. Pupils are equal, round, and reactive to light.  Neck: Normal range of motion. Neck supple.  Cardiovascular: Normal rate, regular rhythm and normal heart sounds.   Pulmonary/Chest: Effort normal and breath sounds normal.  Abdominal: Soft. Bowel sounds are normal.  Suprapubic foley  Musculoskeletal: She exhibits edema (trace). She exhibits no tenderness.  Neurological: She is alert and oriented to person, place, and time.  Skin: Skin is warm and dry. She is not diaphoretic.  Psychiatric: She has a normal mood and affect.    Labs reviewed:  Recent Labs  10/09/15 0500 10/09/15 1211  10/24/15 0545  10/27/15 0345 10/28/15 0615 10/29/15 0326 11/13/15  NA  --   --   < >  --   < > 138 137 140 140  K  --   --   < >  --   < > 3.4* 4.5 3.7 4.1  CL  --   --   < >  --   < > 105 102 104  --   CO2  --   --   < >  --   < > 28 30 29   --   GLUCOSE  --   --   < >  --   < > 108* 117* 100*  --   BUN  --   --   < >  --   < > <5* <5* <5* 10  CREATININE  --   --   < >   --   < > 0.44 0.42* 0.46 0.5  CALCIUM  --   --   < >  --   < > 8.4* 8.7* 8.8*  --   MG 1.6* 2.7*  --  2.0  --   --   --   --   --   PHOS 2.7  --   --   --   --   --   --   --   --   < > = values in this interval not displayed.  Recent Labs  10/18/15 1041 10/19/15 0555 10/20/15 0520 11/13/15  AST 20 11* 14* 15  ALT 19 16 14 17   ALKPHOS 200* 184* 162* 133*  BILITOT 0.5 0.7 0.6  --   PROT 5.5* 5.4* 5.7*  --   ALBUMIN 2.2* 2.2* 2.1*  --     Recent Labs  10/08/15 2339  10/19/15 0555  10/23/15 0450  10/27/15 0345 10/28/15 0615 10/29/15 0326 11/13/15  WBC 9.8  < > 5.9  < >  --   < > 6.3 6.2 6.3 8.2  NEUTROABS 7.2  --  3.7  --  9.6*  --   --   --   --   --   HGB 9.5*  < > 8.5*  < >  --   < > 9.5* 9.5* 10.5* 9.7*  HCT 30.1*  < > 27.6*  < >  --   < > 31.9* 31.6* 34.1* 32*  MCV 87.2  < > 92.0  < >  --   < > 92.5 92.4 92.4  --   PLT 379  < > 356  < >  --   < > 387 389 342 389  < > = values in this interval not displayed. Lab Results  Component Value Date   TSH 0.710 06/12/2015   Lab Results  Component Value Date   HGBA1C 6.0 07/25/2009   Lab Results  Component Value Date   CHOL 150 01/29/2007   HDL 41 01/29/2007   LDLCALC 92 01/29/2007   TRIG 83 01/29/2007   CHOLHDL 3.7 Ratio 01/29/2007    Significant Diagnostic Results in last 30 days:  No results found.  Assessment/Plan 1. Cerebral palsy with spastic/ataxic diplegia Stable, conts to follow with neurology for botox injection and remains on baclofen pump  2. HYPERTENSION, BENIGN - remains stable, not currently on medication  3. Moderate episode of recurrent major depressive disorder ([name]) Followed by [facility] psych. Will cont current regimen at this time  4. Chronic pain syndrome Pain controlled on current medication    5. Constipation Well controlled on current regimen  6. Edema Compression hose as needed  Jayvion Stefanski K. Biagio Borg  [facility] [phone] 8 am - 5  pm) [phone] (after hours)

## 2016-02-02 ENCOUNTER — Ambulatory Visit: Payer: Medicare Other | Admitting: Physical Medicine & Rehabilitation

## 2016-02-02 ENCOUNTER — Ambulatory Visit: Payer: Medicare Other

## 2016-02-02 DIAGNOSIS — Z95828 Presence of other vascular implants and grafts: Secondary | ICD-10-CM | POA: Diagnosis not present

## 2016-02-02 DIAGNOSIS — G809 Cerebral palsy, unspecified: Secondary | ICD-10-CM | POA: Diagnosis not present

## 2016-02-02 DIAGNOSIS — Z86718 Personal history of other venous thrombosis and embolism: Secondary | ICD-10-CM | POA: Diagnosis not present

## 2016-02-02 DIAGNOSIS — Z7189 Other specified counseling: Secondary | ICD-10-CM | POA: Diagnosis not present

## 2016-02-02 DIAGNOSIS — I2699 Other pulmonary embolism without acute cor pulmonale: Secondary | ICD-10-CM | POA: Diagnosis not present

## 2016-02-05 DIAGNOSIS — F331 Major depressive disorder, recurrent, moderate: Secondary | ICD-10-CM | POA: Diagnosis not present

## 2016-02-06 ENCOUNTER — Ambulatory Visit (HOSPITAL_BASED_OUTPATIENT_CLINIC_OR_DEPARTMENT_OTHER): Payer: Medicare Other | Admitting: Physical Medicine & Rehabilitation

## 2016-02-06 ENCOUNTER — Encounter: Payer: Medicare Other | Attending: Physical Medicine & Rehabilitation

## 2016-02-06 ENCOUNTER — Encounter: Payer: Self-pay | Admitting: Physical Medicine & Rehabilitation

## 2016-02-06 VITALS — BP 129/79 | HR 80 | Resp 14

## 2016-02-06 DIAGNOSIS — R252 Cramp and spasm: Secondary | ICD-10-CM | POA: Diagnosis not present

## 2016-02-06 DIAGNOSIS — R258 Other abnormal involuntary movements: Secondary | ICD-10-CM

## 2016-02-06 DIAGNOSIS — G808 Other cerebral palsy: Secondary | ICD-10-CM

## 2016-02-06 NOTE — Progress Notes (Signed)
Subjective:    Patient ID: [name] [name], female    DOB: 1976-06-23, 40 y.o.   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 She underwent Botox injection with the following dosages. Biceps 25 L and 25 R FCR25 L and 50R  FDS25L and 50R FDP25L FPL25L Pronator 25L and 25R  Patient had a good response she feels she can open up her left hand a little bit easier but still has difficulty doing so. She keeps her right hand mainly extended behind her with her elbow in the extended position wrist in a flexed position fingers in a flexed position with the exception of thumb Right upper extremity positioning is flexion at the elbow flexion at the fingers and less so at the wrist   Pain Inventory Average Pain 0 Pain Right Now 0 My pain is no pain  In the last 24 hours, has pain interfered with the following? General activity 0 Relation with others 0 Enjoyment of life 0 What TIME of day is your pain at its worst? no pain Sleep (in general) Fair  Pain is worse with: no pain Pain improves with: no pain Relief from Meds: no pain  Mobility use a wheelchair needs help with transfers  Function disabled: date disabled .  Neuro/Psych bladder control problems bowel control problems  Prior Studies Any changes since last visit?  no  Physicians involved in your care Any changes since last visit?  no   Family History  Problem Relation Age of Onset  . Asthma Father   . Colon cancer Maternal Grandmother     Died in her 36's  . Cancer Paternal Grandmother   . Breast cancer Paternal Grandmother     Died in her 36's  . Heart attack Maternal Grandfather     Died in his 76's  . Alzheimer's disease Paternal Grandfather     Died in his 42's   Social History   Social History  . Marital Status: Divorced    Spouse Name: N/A  . Number of Children: 2  . Years of Education: college   Occupational History  .    [name] UNEMPLOYED    Social  History Main Topics  . Smoking status: Never Smoker   . Smokeless tobacco: Never Used  . Alcohol Use: 0.6 oz/week    1 Glasses of wine per week     Comment: Drinks alcohol once a month.  . Drug Use: No  . Sexual Activity: No   Other Topics Concern  . None   Social History Narrative   Ailene Ards, who has schizophrenia, MR, is father of daughter.  Homero Fellers and pt are no longer together.  Homero Fellers does not see [name]. Daughter is 76 yo, Stage manager, lives with pt.        [name] [phone]), lives with pt's parents.      Needs assistance with ADL's and IADL's--has personal care services 20 hrs/wk;       No tobacco, EtOH, drugs.      **Patient very concerned about her kids being taken from her.      **Case Manager: Jack Quarto [phone]      Patient lives [place]   Past Surgical History  Procedure Laterality Date  . Colposcopy  06/2000  . Cesarean section      x 2  . Tubal ligation  2003  . Carpal tunnel release  08/2008    Dr Teressa Senter  . Wrist surgery  06/2010  Dr Dierdre Searles, hand surgeon, [facility]  . Baclofen pump refill      x 3 times  . Appendectomy    . Intrauterine device insertion  04/30/11    Inserted by [facility] GYN for endometriosis  . Laparoscopically assisted vag hysterectomy  10/27/2012  . Multiple extractions with alveoloplasty Bilateral 07/19/2013    Procedure: EXTRACTIONS #4, 1,61,09,60;  Surgeon: Francene Finders, DDS;  Location: [facility] OR;  Service: Oral Surgery;  Laterality: Bilateral;  . Pain pump implantation N/A 03/07/2014    Procedure: baclofen pump revision/replacement and Catheter connection replacement;  Surgeon: Cristi Loron, MD;  Location: [facility]UR[facility] ORS;  Service: Neurosurgery;  Laterality: N/A;  baclofen pump revision/replacement and Catheter connection replacement  . Programable baclofen pump revision  03/17/14    Battery Replacement   . Cholecystectomy  10/14/2015    Procedure: LAPAROSCOPIC CHOLECYSTECTOMY;  Surgeon: Violeta Gelinas, MD;  Location: [facility]  OR;  Service: General;;   Past Medical History  Diagnosis Date  . Cerebral palsy ([name])   . Pulmonary embolism ([name])     Lifetime Coumadin  . Cerebral palsy ([name])     Spastic Cerebral palsy, mentally intact  . Contracture, joint, multiple sites     Electric wheelchair, uses left hand to operate chair.   [name] Hypertension   . OCD (obsessive compulsive disorder)   . Depression   . Migraine   . [name]   . Dysarthria   . Endometriosis   . History of recurrent UTIs   . GERD (gastroesophageal reflux disease)   . Pulmonary embolism ([name]) 2011, 01/2011    Will be on lifetime coumadin  . Anemia   . Abdominal pain 01/02/2012    Overview:  Overview:  Per Previous pcp note- Dr. Ta--Pt has history of endometriosis and had DUB in 2011.  She She had u/s that showed normal endometrial stripe.  She had endometrial bx that was wnl.  She has a lot of abd cramping every month.  This cramping was sometimes not relieved with hydrocodone.  She was referred to [facility] for IUD placement to help endometriosis and abd cramping.  [name]  . Macroscopic hematuria 03/10/2013    status post replacement of suprapubic tube 03/04/2013   . HYPERTENSION, BENIGN 01/31/2010    Qualifier: Diagnosis of  By: [name], MD, [name] History of endometriosis 10/2012    OBGYN [facility]: Laparoscopic Endometrial Ablation, TVH,  . Gram positive sepsis ([name]) 10/24/2014  . HCAP (healthcare-associated pneumonia) 10/25/2014  . Abdominal pain 01/02/2012    Overview:  Overview:  Per Previous pcp note- Dr. Ta--Pt has history of endometriosis and had DUB in 2011.  She She had u/s that showed normal endometrial stripe.  She had endometrial bx that was wnl.  She has a lot of abd cramping every month.  This cramping was sometimes not relieved with hydrocodone.  She was referred to [facility] for IUD placement to help endometriosis and abd cramping.  [name] was placed 04/30/11 by [facility].  Pt still complains of abd  cramping, which I am treating with hydrocodone and ultram. Pt reports some initial relief of pain from IUD, but states it has now returned back to similar to previous cramping. Gave patient a shot of Toradol 60 mg IM today-since positive exacerbation of abdominal cramping during past 2 weeks. Physical exam reassuring. Patient may need to return to [facility] for further workup since she is plugged in with the GYN providers there for further  treatment options regarding endometriosis and abdominal cramping. The dysfunctional uterine bleeding now well contr  . Acute respiratory failure with hypoxia ([name])   . Cellulitis 12/22/2014  . Disease of female genital organs 10/24/2010    Overview:  Overview:  Pt has history of endometriosis and had DUB in 2011.  She had u/s that showed normal endometrial stripe.  She had endometrial bx that was wnl.  She has a lot of abd cramping every month.  This cramping was sometimes not relieved with hydrocodone.  She was referred to [facility] for IUD placement to help endometriosis and abd cramping.  [name] was placed 04/30/11 by [facility].  Pt still complains of abd cramping, which was treated with hydrocodone and ultram.   Last seen at [facility] by OB/GYN 12/24/11, given Depo Lupron injection.  Ordering CT scan to r/o additional cause of abdominal pain (pt with known endometriosis and will not tolerate vaginal U/S, pain not improved s/p [name] or with Lupron injection-- only caused hot flashes and amenorrhea, no pain relief).  Also made referral to pain clinic for better pain control.  Do not think hysterectomy would help with pain, and concern for contamination of baclofen pump if did proceed with surgery.    8/5/13[name] OB/GYN: Pt was seen by   . Dyspnea and respiratory abnormality 02/04/2011    Overview:  Overview:  Pt has history of recurrent PE and is on chronic coumadin.  Pt has been complaining of dyspnea that she describes as chest tightness.  She has been to the ER multiple  times for this.  Usually she gets CTA and serial CE.  She has had at least 6 CTA in a period of since 2011.  She also has an O2 requirement. It is unclear why pt feels dyspneic, complains of chest tightness, or has O2 requirement.  I thought that her complaint may be from deconditioning and referred her to PT.  Unfortunately Medicaid only pays for 4 PT visits.  Pt should be doing these exercises at home, but she has not.  I have referred her to [facility], and she has been seen by [name].  He did not understand the etiology of her dyspnea and O2 requirement.  He will continue to follow her.   Last Assessment & Plan:  Pt presents with concerns for chest tightness and dyspnea.  She was seen in ED yesterday and discharged home.  While there she had a CT chest 04/20/11 showed 1.  Multifocal degradation as detailed above.  N  . Gross hematuria 03/12/2013    Overview:  Overview:  status post replacement of suprapubic tube 03/04/2013  Last Assessment & Plan:  Recent urine culture collected in ED on 5/4 did not result in significant growth. Moreover, patient has been asymptomatic with normal WBC, no fever and no true symptoms making symptomatic UTI less likely.  Will continue to monitor for any sign of infection.  Patient's xarelto had been held during her recent hospitalization due to hematuria. This has since been re-started. Monitor bleeding.    [name] Kitchen History of anticoagulant therapy 10/21/2012  . Infantile cerebral palsy ([name]) 01/29/2007    Overview:  She has spastic CP.  She is mentally intact.  She is wheelchair bound.  She is wheelchair bound for ambulation      . Intracervical pessary 05/07/2011    Overview:  Overview:  Placed by [facility] GYN 04/30/11 to treat DUB and Endometriosis.  She is seen at GYN clinic at [facility] but was  considered a poor surgical candidate [facility]  For DUB she had pelvic ultrasound that showed thin stripe and she had endometrial Bx by Dr Jennette KettleNeal in GYN clinic, which was  negative.     . Parapneumonic effusion 10/25/2014  . Presence of intrathecal baclofen pump 03/07/2014    R LQ. Insertion T10.    [name] Kitchen. Psychiatric illness 10/31/2014  . Recurrent pulmonary embolism ([name]) 05/03/2011    Pt has history of recurrent PE and is on chronic coumadin.  Pt has been complaining of dyspnea that she describes as chest tightness.  She has been to the ER multiple times for this.  Usually she gets CTA and serial CE.  She has had at least 6 CTA in a period of since 2011.  She also has an O2 requirement. It is unclear why pt feels dyspneic, complains of chest tightness, or has O2 requirement.  I thought that her complaint may be from deconditioning and referred her to PT.  Unfortunately Medicaid only pays for 4 PT visits.  Pt should be doing these exercises at home, but she has not.  [facility] and she has been seen by Dr Marchelle Gearingamaswamy.  He did not understand the etiology of her dyspnea and O2 requirement.  He will continue to follow her.     . Seborrheic keratosis 01/18/2011  . Spasticity 01/05/2014  . Transient alteration of awareness, recurrent 09/08/2006    Recurrent episodes where [name]  loses contact with her world and that have been studied thoroughly and appear nonepileptic in nature per Dr Terrace ArabiaYan (neurology) The patient has had episodes starting in 2007 that initially began 1-4 times a day during which time she would have periods of unawareness followed by confusion. This continuous video EEG monitoring performed from October 8-12, 2007. Had   . Acute cystitis without hematuria   . Acute cystitis with hematuria   . Urethra dilated and patulous 07/03/2015    Patulous, Dilated Urethra. 5mL balloon on a 22-Fr catheter hoping this would prevent the bladder spasm from pushing the balloon down the urethra (Dr Logan BoresEvans, Urology [facility], July 2016)    BP 129/79 mmHg  Pulse 80  Resp 14  SpO2 95%  LMP 04/05/2011  Opioid Risk Score:   Fall Risk Score:  `1  Depression screen PHQ  2/9  Depression screen PHQ 2/9 01/08/2016  Decreased Interest 0  Down, Depressed, Hopeless 0  PHQ - 2 Score 0     Review of Systems  All other systems reviewed and are negative.      Objective:   Physical Exam  Constitutional: She is oriented to person, place, and time. She appears well-developed and well-nourished.  Neck:  Right lateral Collis  Neurological: She is alert and oriented to person, place, and time. She displays no atrophy. She exhibits abnormal muscle tone. She displays no seizure activity. Coordination abnormal.  Positive dysarthria  Psychiatric: She has a normal mood and affect.  Nursing note and vitals reviewed.  Motor strength is 3 minus at the left deltoid 3 minus triceps 3 at the biceps 3+ finger flexion 3 minus finger extension including thumb extension  2 minus right deltoid 3 minus right biceps 2 minus  wrist extension and finger extension  Modified [place] is to at the left elbow flexor and wrist flexor and finger flexors 3 at the right wrist flexor and finger flexors and 1 at the right elbow flexors Sensation is equal to light touch in both upper  limbs     Assessment & Plan:  1 spastic tetraplegia with improvements in upper extremity spasticity following botulinum toxin injection. She would benefit from increase in the dose from 300 units to 400 units with the following recommended injection sites  Biceps 25 L and 25 R FCR25 L and 50R  FDS50L and 50R FDP25L and 50 R FPL50L Pronator 25L and 25R  We will check the response to the new dosages. If it is improving hand function in the right or left side we would continue that. If it isn't then we would consider going back to 300 units and using the additional 100 units to address the cervical dystonia

## 2016-02-09 DIAGNOSIS — Z885 Allergy status to narcotic agent status: Secondary | ICD-10-CM | POA: Diagnosis not present

## 2016-02-09 DIAGNOSIS — Z9989 Dependence on other enabling machines and devices: Secondary | ICD-10-CM | POA: Diagnosis not present

## 2016-02-09 DIAGNOSIS — D638 Anemia in other chronic diseases classified elsewhere: Secondary | ICD-10-CM | POA: Diagnosis not present

## 2016-02-09 DIAGNOSIS — F339 Major depressive disorder, recurrent, unspecified: Secondary | ICD-10-CM | POA: Diagnosis not present

## 2016-02-09 DIAGNOSIS — I10 Essential (primary) hypertension: Secondary | ICD-10-CM | POA: Diagnosis not present

## 2016-02-09 DIAGNOSIS — M1990 Unspecified osteoarthritis, unspecified site: Secondary | ICD-10-CM | POA: Diagnosis not present

## 2016-02-09 DIAGNOSIS — Z86718 Personal history of other venous thrombosis and embolism: Secondary | ICD-10-CM | POA: Diagnosis not present

## 2016-02-09 DIAGNOSIS — I959 Hypotension, unspecified: Secondary | ICD-10-CM | POA: Diagnosis not present

## 2016-02-09 DIAGNOSIS — F419 Anxiety disorder, unspecified: Secondary | ICD-10-CM | POA: Diagnosis not present

## 2016-02-09 DIAGNOSIS — Z90710 Acquired absence of both cervix and uterus: Secondary | ICD-10-CM | POA: Diagnosis not present

## 2016-02-09 DIAGNOSIS — F429 Obsessive-compulsive disorder, unspecified: Secondary | ICD-10-CM | POA: Diagnosis not present

## 2016-02-09 DIAGNOSIS — G4089 Other seizures: Secondary | ICD-10-CM | POA: Diagnosis not present

## 2016-02-09 DIAGNOSIS — N39498 Other specified urinary incontinence: Secondary | ICD-10-CM | POA: Diagnosis not present

## 2016-02-09 DIAGNOSIS — Z791 Long term (current) use of non-steroidal anti-inflammatories (NSAID): Secondary | ICD-10-CM | POA: Diagnosis not present

## 2016-02-09 DIAGNOSIS — Z86711 Personal history of pulmonary embolism: Secondary | ICD-10-CM | POA: Diagnosis not present

## 2016-02-09 DIAGNOSIS — E876 Hypokalemia: Secondary | ICD-10-CM | POA: Diagnosis not present

## 2016-02-09 DIAGNOSIS — K219 Gastro-esophageal reflux disease without esophagitis: Secondary | ICD-10-CM | POA: Diagnosis not present

## 2016-02-09 DIAGNOSIS — Z975 Presence of (intrauterine) contraceptive device: Secondary | ICD-10-CM | POA: Diagnosis not present

## 2016-02-09 DIAGNOSIS — S3739XA Other injury of urethra, initial encounter: Secondary | ICD-10-CM | POA: Diagnosis not present

## 2016-02-09 DIAGNOSIS — G800 Spastic quadriplegic cerebral palsy: Secondary | ICD-10-CM | POA: Diagnosis not present

## 2016-02-09 DIAGNOSIS — R32 Unspecified urinary incontinence: Secondary | ICD-10-CM | POA: Diagnosis not present

## 2016-02-09 DIAGNOSIS — N319 Neuromuscular dysfunction of bladder, unspecified: Secondary | ICD-10-CM | POA: Diagnosis not present

## 2016-02-09 DIAGNOSIS — Z79899 Other long term (current) drug therapy: Secondary | ICD-10-CM | POA: Diagnosis not present

## 2016-02-09 DIAGNOSIS — G894 Chronic pain syndrome: Secondary | ICD-10-CM | POA: Diagnosis not present

## 2016-02-09 DIAGNOSIS — N393 Stress incontinence (female) (male): Secondary | ICD-10-CM | POA: Diagnosis not present

## 2016-02-09 DIAGNOSIS — N368 Other specified disorders of urethra: Secondary | ICD-10-CM | POA: Diagnosis not present

## 2016-02-09 DIAGNOSIS — Z79891 Long term (current) use of opiate analgesic: Secondary | ICD-10-CM | POA: Diagnosis not present

## 2016-02-10 DIAGNOSIS — Z7409 Other reduced mobility: Secondary | ICD-10-CM | POA: Diagnosis not present

## 2016-02-10 DIAGNOSIS — N39498 Other specified urinary incontinence: Secondary | ICD-10-CM | POA: Diagnosis not present

## 2016-02-10 DIAGNOSIS — N319 Neuromuscular dysfunction of bladder, unspecified: Secondary | ICD-10-CM | POA: Diagnosis not present

## 2016-02-10 DIAGNOSIS — D638 Anemia in other chronic diseases classified elsewhere: Secondary | ICD-10-CM | POA: Diagnosis not present

## 2016-02-10 DIAGNOSIS — N368 Other specified disorders of urethra: Secondary | ICD-10-CM | POA: Diagnosis not present

## 2016-02-10 DIAGNOSIS — F339 Major depressive disorder, recurrent, unspecified: Secondary | ICD-10-CM | POA: Diagnosis not present

## 2016-02-10 DIAGNOSIS — G809 Cerebral palsy, unspecified: Secondary | ICD-10-CM | POA: Diagnosis not present

## 2016-02-10 DIAGNOSIS — G800 Spastic quadriplegic cerebral palsy: Secondary | ICD-10-CM | POA: Diagnosis not present

## 2016-02-12 ENCOUNTER — Other Ambulatory Visit: Payer: Self-pay | Admitting: *Deleted

## 2016-02-12 ENCOUNTER — Encounter: Payer: Self-pay | Admitting: Internal Medicine

## 2016-02-12 ENCOUNTER — Non-Acute Institutional Stay (SKILLED_NURSING_FACILITY): Payer: Medicare Other | Admitting: Internal Medicine

## 2016-02-12 DIAGNOSIS — R32 Unspecified urinary incontinence: Secondary | ICD-10-CM | POA: Diagnosis not present

## 2016-02-12 DIAGNOSIS — F429 Obsessive-compulsive disorder, unspecified: Secondary | ICD-10-CM | POA: Diagnosis not present

## 2016-02-12 DIAGNOSIS — I10 Essential (primary) hypertension: Secondary | ICD-10-CM

## 2016-02-12 DIAGNOSIS — L738 Other specified follicular disorders: Secondary | ICD-10-CM | POA: Diagnosis not present

## 2016-02-12 DIAGNOSIS — R569 Unspecified convulsions: Secondary | ICD-10-CM | POA: Insufficient documentation

## 2016-02-12 DIAGNOSIS — G801 Spastic diplegic cerebral palsy: Secondary | ICD-10-CM

## 2016-02-12 DIAGNOSIS — F331 Major depressive disorder, recurrent, moderate: Secondary | ICD-10-CM

## 2016-02-12 DIAGNOSIS — R0600 Dyspnea, unspecified: Secondary | ICD-10-CM | POA: Insufficient documentation

## 2016-02-12 DIAGNOSIS — F428 Other obsessive-compulsive disorder: Secondary | ICD-10-CM

## 2016-02-12 HISTORY — DX: Unspecified convulsions: R56.9

## 2016-02-12 MED ORDER — OXYCODONE HCL 5 MG PO TABS: ORAL_TABLET | ORAL | Status: DC

## 2016-02-12 NOTE — Telephone Encounter (Signed)
[facility] 

## 2016-02-12 NOTE — Progress Notes (Signed)
Provider:  Murray Hodgkins MD Location:  [facility] Room Number: 413-B Place of Service:  SNF (31)  PCP: Kimber Relic, MD Patient Care Team: Kimber Relic, MD as PCP - General (Internal Medicine) Jamison Neighbor, MD as Consulting Physician (Urology) Deetta Perla, MD as Consulting Physician (Neurology) Glori Luis, MD (Family Medicine) Levert Feinstein, MD as Consulting Physician (Neurology)  Extended Emergency Contact Information Primary Emergency Contact: Mello,[name] Address: [address], [place] Home Phone: [phone] Relation: Father Secondary Emergency Contact: Melanie Crazier [place] Home Phone: [phone] Relation: Friend  Code Status: full code Goals of Care: Advanced Directive information Advanced Directives 02/12/2016  Does patient have an advance directive? No  Would patient like information on creating an advanced directive? No - patient declined information      Chief Complaint  Patient presents with  . New Admit To SNF    HPI: Patient is a 40 y.o. female seen today for readmission to [place] following hospitalization from 02/08/2012 through 02/10/2012. Patient had a urologic procedure at [facility] in [place]. She had urethral closure and another of a series of botulinum injections intravesical. She has known neurogenic bladder. She has had previous urethral closure. She had an area that opened between the bladder and the vagina and subsequent urine leakage through that area. This was repaired. She has a suprapubic catheter and this was exchanged during her hospitalization.  Chronic conditions include cerebral palsyspastic nature. 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  Reason chronic pain syndrome related to contracture joints and spasticity. She is on multiple medications for this.  There is a history of prior seizure disorder. Last  seizure was approximately 2010.  She has chronic depression and is on medications for that.  Chronic neck and back pains.  She has GERD.  Chronic anxiety is present.  History of dyspnea which was evaluated by Dr. Marchelle Gearing. Etiology of this problem is uncertain.  Patient has a history of DVT and pulmonary embolism. She also has a Freight forwarder. She has been on chronic anticoagulation in the past, but is off this at present time.  Past Medical History  Diagnosis Date  . Cerebral palsy ([name])   . Pulmonary embolism ([name])     Lifetime Coumadin  . Cerebral palsy ([name])     Spastic Cerebral palsy, mentally intact  . Contracture, joint, multiple sites     Electric wheelchair, uses left hand to operate chair.   [name] Kitchen Hypertension   . OCD (obsessive compulsive disorder)   . Depression   . Migraine   . [name]   . Dysarthria   . Endometriosis   . History of recurrent UTIs   . GERD (gastroesophageal reflux disease)   . Pulmonary embolism ([name]) 2011, 01/2011    Will be on lifetime coumadin  . Anemia   . Abdominal pain 01/02/2012    Overview:  Overview:  Per Previous pcp note- Dr. Ta--Pt has history of endometriosis and had DUB in 2011.  She She had u/s that showed normal endometrial stripe.  She had endometrial bx that was wnl.  She has a lot of abd cramping every month.  This cramping was sometimes not relieved with hydrocodone.  She was referred to [facility] for IUD placement to help endometriosis and abd cramping.  [name]  . Macroscopic hematuria 03/10/2013    status post replacement of  suprapubic tube 03/04/2013   . HYPERTENSION, BENIGN 01/31/2010    Qualifier: Diagnosis of  By: [name], MD, [name] History of endometriosis 10/2012    OBGYN [facility]: Laparoscopic Endometrial Ablation, TVH,  . Gram positive sepsis ([name]) 10/24/2014  . HCAP (healthcare-associated pneumonia) 10/25/2014  . Abdominal pain 01/02/2012    Overview:  Overview:  Per Previous pcp note-  Dr. Ta--Pt has history of endometriosis and had DUB in 2011.  She She had u/s that showed normal endometrial stripe.  She had endometrial bx that was wnl.  She has a lot of abd cramping every month.  This cramping was sometimes not relieved with hydrocodone.  She was referred to [facility] for IUD placement to help endometriosis and abd cramping.  [name] was placed 04/30/11 by [facility].  Pt still complains of abd cramping, which I am treating with hydrocodone and ultram. Pt reports some initial relief of pain from IUD, but states it has now returned back to similar to previous cramping. Gave patient a shot of Toradol 60 mg IM today-since positive exacerbation of abdominal cramping during past 2 weeks. Physical exam reassuring. Patient may need to return to [facility] for further workup since she is plugged in with the GYN providers there for further treatment options regarding endometriosis and abdominal cramping. The dysfunctional uterine bleeding now well contr  . Acute respiratory failure with hypoxia ([name])   . Cellulitis 12/22/2014  . Disease of female genital organs 10/24/2010    Overview:  Overview:  Pt has history of endometriosis and had DUB in 2011.  She had u/s that showed normal endometrial stripe.  She had endometrial bx that was wnl.  She has a lot of abd cramping every month.  This cramping was sometimes not relieved with hydrocodone.  She was referred to [facility] for IUD placement to help endometriosis and abd cramping.  [name] was placed 04/30/11 by [facility].  Pt still complains of abd cramping, which was treated with hydrocodone and ultram.   Last seen at [facility] by OB/GYN 12/24/11, given Depo Lupron injection.  Ordering CT scan to r/o additional cause of abdominal pain (pt with known endometriosis and will not tolerate vaginal U/S, pain not improved s/p [name] or with Lupron injection-- only caused hot flashes and amenorrhea, no pain relief).  Also made referral to pain clinic for better pain  control.  Do not think hysterectomy would help with pain, and concern for contamination of baclofen pump if did proceed with surgery.    8/5/13[name] [facility]: Pt was seen by   . Dyspnea and respiratory abnormality 02/04/2011    Overview:  Overview:  Pt has history of recurrent PE and is on chronic coumadin.  Pt has been complaining of dyspnea that she describes as chest tightness.  She has been to the ER multiple times for this.  Usually she gets CTA and serial CE.  She has had at least 6 CTA in a period of since 2011.  She also has an O2 requirement. It is unclear why pt feels dyspneic, complains of chest tightness, or has O2 requirement.  I thought that her complaint may be from deconditioning and referred her to PT.  Unfortunately Medicaid only pays for 4 PT visits.  Pt should be doing these exercises at home, but she has not.  I have referred her to [facility], and she has been seen by [name].  He did not understand the etiology of her dyspnea and O2 requirement.  He will continue to follow her.   Last Assessment & Plan:  Pt presents with concerns for chest tightness and dyspnea.  She was seen in ED yesterday and discharged home.  While there she had a CT chest 04/20/11 showed 1.  Multifocal degradation as detailed above.  N  . Gross hematuria 03/12/2013    Overview:  Overview:  status post replacement of suprapubic tube 03/04/2013  Last Assessment & Plan:  Recent urine culture collected in ED on 5/4 did not result in significant growth. Moreover, patient has been asymptomatic with normal WBC, no fever and no true symptoms making symptomatic UTI less likely.  Will continue to monitor for any sign of infection.  Patient's xarelto had been held during her recent hospitalization due to hematuria. This has since been re-started. Monitor bleeding.    [name] Kitchen History of anticoagulant therapy 10/21/2012  . Infantile cerebral palsy ([name]) 01/29/2007    Overview:  She has spastic CP.  She is mentally intact.  She is  wheelchair bound.  She is wheelchair bound for ambulation      . Intracervical pessary 05/07/2011    Overview:  Overview:  Placed by [facility] 04/30/11 to treat DUB and Endometriosis.  She is seen at GYN clinic at [facility] but was considered a poor surgical candidate and referred her to [facility].  For DUB she had pelvic ultrasound that showed thin stripe and she had endometrial Bx by Dr Jennette Kettle in GYN clinic, which was negative.     . Parapneumonic effusion 10/25/2014  . Presence of intrathecal baclofen pump 03/07/2014    R LQ. Insertion T10.    [name] Kitchen Psychiatric illness 10/31/2014  . Recurrent pulmonary embolism ([name]) 05/03/2011    Pt has history of recurrent PE and is on chronic coumadin.  Pt has been complaining of dyspnea that she describes as chest tightness.  She has been to the ER multiple times for this.  Usually she gets CTA and serial CE.  She has had at least 6 CTA in a period of since 2011.  She also has an O2 requirement. It is unclear why pt feels dyspneic, complains of chest tightness, or has O2 requirement.  I thought that her complaint may be from deconditioning and referred her to PT.  Unfortunately Medicaid only pays for 4 PT visits.  Pt should be doing these exercises at home, but she has not.  I have referred her to [facility], and she has been seen by [name].  He did not understand the etiology of her dyspnea and O2 requirement.  He will continue to follow her.     . Seborrheic keratosis 01/18/2011  . Spasticity 01/05/2014  . Transient alteration of awareness, recurrent 09/08/2006    Recurrent episodes where Shilynn  loses contact with her world and that have been studied thoroughly and appear nonepileptic in nature per Dr Terrace Arabia (neurology) The patient has had episodes starting in 2007 that initially began 1-4 times a day during which time she would have periods of unawareness followed by confusion. This continuous video EEG monitoring performed from October 8-12, 2007. Had   . Acute cystitis  without hematuria   . Acute cystitis with hematuria   . Urethra dilated and patulous 07/03/2015    Patulous, Dilated Urethra. 5mL balloon on a 22-Fr catheter hoping this would prevent the bladder spasm from pushing the balloon down the urethra (Dr Logan Bores, Urology [facility], July 2016)    Past Surgical History  Procedure Laterality Date  .  Colposcopy  06/2000  . Cesarean section      x 2  . Tubal ligation  2003  . Carpal tunnel release  08/2008    Dr Teressa SenterSypher  . Wrist surgery  06/2010    Dr Dierdre [facility]lesLi[facility]  . Baclofen pump refill      x 3 times  . Appendectomy    . Intrauterine device insertion  04/30/11    [facility]  . Laparoscopically assisted vag hysterectomy  10/27/2012  . Multiple extractions with alveoloplasty Bilateral 07/19/2013    Procedure: EXTRACTIONS #4, 1,61,09,605,11,16,19;  Surgeon: Francene Findershristopher L Wilson, DDS;  [facility];  Service: Oral Surgery;  Laterality: Bilateral;  . Pain pump implantation N/A 03/07/2014    Procedure: baclofen pump revision/replacement and Catheter connection replacement;  Surgeon: Cristi LoronJeffrey D Jenkins, MD;  Location: [facility] ORS;  Service: Neurosurgery;  Laterality: N/A;  baclofen pump revision/replacement and Catheter connection replacement  . Programable baclofen pump revision  03/17/14    Battery Replacement   . Cholecystectomy  10/14/2015    Procedure: LAPAROSCOPIC CHOLECYSTECTOMY;  Surgeon: Violeta GelinasBurke Thompson, MD;  [facility];  Service: General;;  . Abdominal hysterectomy    . Neuroma surgery Left     anterior and posterior intersosseous    reports that she has never smoked. She has never used smokeless tobacco. She reports that she drinks about 0.6 oz of alcohol per week. She reports that she does not use illicit drugs. Social History   Social History  . Marital Status: Divorced    Spouse Name: N/A  . Number of Children: 2  . Years of Education: college   Occupational History  .    [name] Kitchen. UNEMPLOYED    Social  History Main Topics  . Smoking status: Never Smoker   . Smokeless tobacco: Never Used  . Alcohol Use: 0.6 oz/week    1 Glasses of wine per week     Comment: Drinks alcohol once a month.  . Drug Use: No  . Sexual Activity: No   Other Topics Concern  . Not on file   Social History Narrative   Ailene ArdsFrank Haith, who has schizophrenia, MR, is father of daughter.  Homero FellersFrank and pt are no longer together.  Homero FellersFrank does not see Norberta KeensKiarra. Daughter is 198 yo, Stage managerKiarra Haith, lives with pt.        Maisie Fushomas [phone](1977), lives with pt's parents.      Needs assistance with ADL's and IADL's--has personal care services 20 hrs/wk;       No tobacco, EtOH, drugs.      **Patient very concerned about her kids being taken from her.      **Case Manager: Jack Quartosabelle Culler [phone]      Patient lives [place]      Family History  Problem Relation Age of Onset  . Asthma Father   . Colon cancer Maternal Grandmother     Died in her 4460's  . Cancer Paternal Grandmother   . Breast cancer Paternal Grandmother     Died in her 870's  . Heart attack Maternal Grandfather     Died in his 2570's  . Alzheimer's disease Paternal Grandfather     Died in his 2580's    Health Maintenance  Topic Date Due  . PAP SMEAR  11/12/2013  . INFLUENZA VACCINE  07/02/2016  . TETANUS/TDAP  03/18/2018  . HIV Screening  Completed    Allergies  Allergen Reactions  . Morphine Dermatitis and Other (  See Comments)    Skin turned red      Medication List       This list is accurate as of: 02/12/16  8:54 AM.  Always use your most recent med list.               acetaminophen 325 MG tablet  Commonly known as:  TYLENOL  Take 650 mg by mouth every 6 (six) hours as needed for moderate pain.     amantadine 100 MG capsule  Commonly known as:  SYMMETREL  Take 100 mg by mouth daily.     baclofen 20 MG tablet  Commonly known as:  LIORESAL  Take 20 mg by mouth 4 (four) times daily. For bladder spasms     bisacodyl 10 MG suppository    Commonly known as:  DULCOLAX  Place 10 mg rectally daily as needed for moderate constipation.     clonazePAM 0.5 MG tablet  Commonly known as:  KLONOPIN  Take 0.5 mg by mouth 2 (two) times daily.     docusate sodium 100 MG capsule  Commonly known as:  COLACE  Take 1 capsule (100 mg total) by mouth daily as needed for mild constipation.     feeding supplement (ENSURE ENLIVE) Liqd  Take 237 mLs by mouth 2 (two) times daily between meals.     flavoxATE 100 MG tablet  Commonly known as:  URISPAS  Take 100 mg by mouth 3 (three) times daily as needed for bladder spasms.     FLUoxetine 20 MG tablet  Commonly known as:  PROZAC  Take 20 mg by mouth daily.     ibuprofen 200 MG tablet  Commonly known as:  ADVIL,MOTRIN  Take 400 mg by mouth every 8 (eight) hours as needed for moderate pain.     lamoTRIgine 100 MG tablet  Commonly known as:  LAMICTAL  Take 100 mg by mouth at bedtime.     magnesium hydroxide 400 MG/5ML suspension  Commonly known as:  MILK OF MAGNESIA  Take 30 mLs by mouth daily as needed for mild constipation.     multivitamin tablet  Take 1 tablet by mouth daily.     ondansetron 8 MG disintegrating tablet  Commonly known as:  ZOFRAN-ODT  Take 1 tablet (8 mg total) by mouth every 8 (eight) hours as needed for nausea or vomiting.     oxyCODONE 5 MG immediate release tablet  Commonly known as:  Oxy IR/ROXICODONE  Take 1 tablet (5 mg total) by mouth every 4 (four) hours as needed for moderate pain or severe pain (may give 2 tablets every 4 hours for severe pain).     promethazine 12.5 MG tablet  Commonly known as:  PHENERGAN  Take 12.5 mg by mouth every 6 (six) hours as needed for nausea or vomiting.     QUEtiapine 100 MG tablet  Commonly known as:  SEROQUEL  Take 1 tablet (100 mg total) by mouth at bedtime.     tiZANidine 4 MG capsule  Commonly known as:  ZANAFLEX  Take 4 mg by mouth 2 (two) times daily at 8 am and 10 pm.        Review of Systems   Constitutional: Negative for fever, chills, diaphoresis, activity change, appetite change, fatigue and unexpected weight change.  HENT: Negative for congestion, ear discharge, ear pain, hearing loss, postnasal drip, rhinorrhea, sore throat, tinnitus, trouble swallowing and voice change.   Eyes: Positive for visual disturbance (corrective lenses). Negative for pain, redness  and itching.  Respiratory: Positive for shortness of breath. Negative for cough, choking and wheezing.   Cardiovascular: Positive for leg swelling. Negative for chest pain and palpitations.  Gastrointestinal: Positive for constipation. Negative for nausea, abdominal pain, diarrhea and abdominal distention.       GERD  Endocrine: Negative for cold intolerance, heat intolerance, polydipsia, polyphagia and polyuria.  Genitourinary: Negative for dysuria, urgency, frequency, hematuria, flank pain, vaginal discharge, difficulty urinating and pelvic pain.       Suprapubic catheter. History of urethral closure.  Musculoskeletal: Negative for myalgias, back pain, arthralgias, gait problem, neck pain and neck stiffness.       Spastic cerebral palsy with multiple joint contractures  Skin: Negative for color change, pallor and rash.  Allergic/Immunologic: Negative.   Neurological: Negative for dizziness, tremors, seizures, syncope, weakness, numbness and headaches.       Cerebral palsy from birth. Spastic and athetoid movements present.  Hematological: Negative for adenopathy. Does not bruise/bleed easily.       History of anemia  Psychiatric/Behavioral: Positive for sleep disturbance and dysphoric mood. Negative for suicidal ideas, hallucinations, behavioral problems, confusion and agitation. The patient is nervous/anxious. The patient is not hyperactive.        Chronic anxiety    Filed Vitals:   02/12/16 0853  BP: 136/84  Pulse: 87  Temp: 98.7 F (37.1 C)  TempSrc: Oral  Resp: 20   There is no weight on file to calculate  BMI. Physical Exam  Constitutional: She is oriented to person, place, and time. She appears well-developed and well-nourished. No distress.  HENT:  Head: Normocephalic and atraumatic.  Right Ear: External ear normal.  Left Ear: External ear normal.  Nose: Nose normal.  Mouth/Throat: Oropharynx is clear and moist. No oropharyngeal exudate.  Eyes: Conjunctivae and EOM are normal. Pupils are equal, round, and reactive to light. No scleral icterus.  Neck: Normal range of motion. Neck supple. No JVD present. No tracheal deviation present. No thyromegaly present.  Cardiovascular: Normal rate, regular rhythm, normal heart sounds and intact distal pulses.  Exam reveals no gallop and no friction rub.   No murmur heard. Pulmonary/Chest: Effort normal and breath sounds normal. No respiratory distress. She has no wheezes. She has no rales. She exhibits no tenderness.  Abdominal: Soft. Bowel sounds are normal. She exhibits no distension and no mass. There is no tenderness.  Suprapubic foley  Genitourinary:  Suprapubic Foley catheter  Musculoskeletal: Normal range of motion. She exhibits edema (trace). She exhibits no tenderness.  Multiple joint contractures  Lymphadenopathy:    She has no cervical adenopathy.  Neurological: She is alert and oriented to person, place, and time. No cranial nerve deficit. Coordination normal.  Spastic cerebral palsy with athetoid movements  Skin: Skin is warm and dry. No rash noted. She is not diaphoretic. No erythema. No pallor.  Psychiatric: She has a normal mood and affect. Her behavior is normal. Judgment and thought content normal.    Labs reviewed: Basic Metabolic Panel:  Recent Labs  16/10/96 0500 10/09/15 1211  10/24/15 0545  10/27/15 0345 10/28/15 0615 10/29/15 0326 11/13/15  NA  --   --   < >  --   < > 138 137 140 140  K  --   --   < >  --   < > 3.4* 4.5 3.7 4.1  CL  --   --   < >  --   < > 105 102 104  --   CO2  --   --   < >  --   < >  --   GLUCOSE  --   --   < >  --   < > 108* 117* 100*  --   BUN  --   --   < >  --   < > <5* <5* <5* 10  CREATININE  --   --   < >  --   < > 0.44 0.42* 0.46 0.5  CALCIUM  --   --   < >  --   < > 8.4* 8.7* 8.8*  --   MG 1.6* 2.7*  --  2.0  --   --   --   --   --   PHOS 2.7  --   --   --   --   --   --   --   --   < > = values in this interval not displayed. Liver Function Tests:  Recent Labs  10/18/15 1041 10/19/15 0555 10/20/15 0520 11/13/15  AST 20 11* 14* 15  ALT ALKPHOS 200* 184* 162* 133*  BILITOT 0.5 0.7 0.6  --   PROT 5.5* 5.4* 5.7*  --   ALBUMIN 2.2* 2.2* 2.1*  --     Recent Labs  10/08/15 2339 10/12/15 1525  LIPASE 18 18   No results for input(s): AMMONIA in the last 8760 hours. CBC:  Recent Labs  10/08/15 2339  10/19/15 0555  10/23/15 0450  10/27/15 0345 10/28/15 0615 10/29/15 0326 11/13/15  WBC 9.8  < > 5.9  < >  --   < > 6.3 6.2 6.3 8.2  NEUTROABS 7.2  --  3.7  --  9.6*  --   --   --   --   --   HGB 9.5*  < > 8.5*  < >  --   < > 9.5* 9.5* 10.5* 9.7*  HCT 30.1*  < > 27.6*  < >  --   < > 31.9* 31.6* 34.1* 32*  MCV 87.2  < > 92.0  < >  --   < > 92.5 92.4 92.4  --   PLT 379  < > 356  < >  --   < > 387 389 342 389  < > = values in this interval not displayed. Cardiac Enzymes: No results for input(s): CKTOTAL, CKMB, CKMBINDEX, TROPONINI in the last 8760 hours. BNP: Invalid input(s): POCBNP Lab Results  Component Value Date   HGBA1C 6.0 07/25/2009   Lab Results  Component Value Date   TSH 0.710 06/12/2015   Lab Results  Component Value Date   VITAMINB12 1076* 10/12/2015   Lab Results  Component Value Date   FOLATE 16.1 10/12/2015   Lab Results  Component Value Date   IRON 9* 10/12/2015   TIBC 241* 10/12/2015   FERRITIN 58 10/12/2015    Imaging and Procedures obtained prior to SNF admission: Ir Catheter Tube Change  10/09/2015  CLINICAL DATA:  40 year old female with a history of suprapubic catheter placement. The patient has  been diagnosed with urinary tract infection and has been referred for exchange. EXAM: IR CATHETER TUBE CHANGE MEDICATIONS: None CONTRAST:  Small amount of contrast through the existing and exchanged tube FLUOROSCOPY TIME:  Fluoroscopy Time (in minutes and seconds): 6 seconds PROCEDURE: The procedure, risks, benefits, and alternatives were explained to the patient. Questions regarding the procedure were encouraged and answered. The patient understands and consents to the procedure. Patient positioned supine position an the indwelling catheter  were prepped and draped in the usual sterile fashion with Betadine. A sterile drape was applied covering the operative field. A sterile gown and sterile gloves were used for the procedure. Small amount of contrast was infused through the indwelling catheter to confirm position within the urinary bladder. Balloon was deflated and the catheter was removed. A new 22 French Foley catheter was inserted through the ostomy into the urinary bladder. The balloon was inflated with 10 cc of saline. Injection of contrast confirmed position of the new catheter. Patient tolerated the procedure well and remained hemodynamically stable throughout. No complications were encountered and no significant blood loss was encounter COMPLICATIONS: None FINDINGS: Indwelling catheter position within the urinary bladder. After exchange of the new 22 French catheter, we confirmed position with injection. IMPRESSION: Status post fluoroscopic guided exchange of suprapubic catheter with a new 22 French balloon retention Foley catheter. Signed, Yvone Neu. Loreta Ave, DO [facility] Radiology Electronically Signed   By: Gilmer Mor D.O.   On: 10/09/2015 16:37   Dg Chest Portable 1 View  10/09/2015  CLINICAL DATA:  Central line placement EXAM: PORTABLE CHEST 1 VIEW COMPARISON:  And yesterday FINDINGS: Left IJ central line, tip at the upper right atrium. No evidence  of pneumothorax. No new mediastinal widening. Low lung volumes with perihilar atelectasis. No edema or pneumonia suspected. Normal heart size. IMPRESSION: 1. Left IJ central line with tip at the upper right atrium. No pneumothorax. 2. Progressive hypoventilation and perihilar atelectasis. Electronically Signed   By: Marnee Spring M.D.   On: 10/09/2015 02:39   Dg Chest Port 1 View  10/08/2015  CLINICAL DATA:  Concern for sepsis. EXAM: PORTABLE CHEST 1 VIEW COMPARISON:  Chest CT 09/24/2015 FINDINGS: Chronically low lung volumes. There is no edema, consolidation, effusion, or pneumothorax. Normal heart size and mediastinal contours for technique. No acute osseous finding.  Lower thoracic spinal catheter noted. IMPRESSION: Negative for pneumonia. Electronically Signed   By: Marnee Spring M.D.   On: 10/08/2015 23:28   US Abdomen Limited Ruq  10/09/2015  CLINICAL DATA:  Right upper quadrant tenderness. EXAM: US ABDOMEN LIMITED - RIGHT UPPER QUADRANT COMPARISON:  Abdominal CT 09/24/2015 FINDINGS: Gallbladder: Numerous gallstones within the full gallbladder. Mild wall thickening of 4 mm without pericholecystic edema or striation. There is no focal tenderness per sonographer exam (with analgesic role indeterminate). Common bile duct: Diameter: 4 mm Liver: No focal lesion identified. Within normal limits in parenchymal echogenicity. Antegrade flow in the imaged portal venous system. IMPRESSION: Cholelithiasis with indeterminate presence of acute cholecystitis given conflicting findings (there is gallbladder distention and mild wall thickening but no focal tenderness). HIDA scan could evaluate cystic duct patency. Electronically Signed   By: Marnee Spring M.D.   On: 10/09/2015 04:49    Assessment/Plan 1. Urinary incontinence, unspecified incontinence type Continue with urologist, Dr. Marcelyn Bruins. Suprapubic catheter working well. Vaginal packing was left in place at the end of surgery, cyst be  removed.  2. CP (cerebral palsy), spastic ([name]) Continue current medications for spasticity  3. HYPERTENSION, BENIGN Controlled on current medication  4. Moderate episode of recurrent major depressive disorder [facility]) Patient asking whether she can resume Abilify. She is on Seroquel. I would transfuse both drugs.  5. Obsessive Compulsive Disorder Continue current medications

## 2016-02-14 ENCOUNTER — Non-Acute Institutional Stay (SKILLED_NURSING_FACILITY): Payer: Medicare Other | Admitting: Nurse Practitioner

## 2016-02-14 ENCOUNTER — Encounter: Payer: Self-pay | Admitting: Nurse Practitioner

## 2016-02-14 DIAGNOSIS — I2782 Chronic pulmonary embolism: Secondary | ICD-10-CM | POA: Diagnosis not present

## 2016-02-14 DIAGNOSIS — F411 Generalized anxiety disorder: Secondary | ICD-10-CM

## 2016-02-14 DIAGNOSIS — N3945 Continuous leakage: Secondary | ICD-10-CM | POA: Diagnosis not present

## 2016-02-14 DIAGNOSIS — G894 Chronic pain syndrome: Secondary | ICD-10-CM | POA: Diagnosis not present

## 2016-02-14 DIAGNOSIS — R569 Unspecified convulsions: Secondary | ICD-10-CM | POA: Diagnosis not present

## 2016-02-14 DIAGNOSIS — F330 Major depressive disorder, recurrent, mild: Secondary | ICD-10-CM

## 2016-02-14 DIAGNOSIS — D638 Anemia in other chronic diseases classified elsewhere: Secondary | ICD-10-CM | POA: Diagnosis not present

## 2016-02-14 DIAGNOSIS — I10 Essential (primary) hypertension: Secondary | ICD-10-CM

## 2016-02-14 DIAGNOSIS — R52 Pain, unspecified: Secondary | ICD-10-CM | POA: Diagnosis not present

## 2016-02-14 DIAGNOSIS — G808 Other cerebral palsy: Secondary | ICD-10-CM

## 2016-02-14 DIAGNOSIS — Z9359 Other cystostomy status: Secondary | ICD-10-CM

## 2016-02-14 DIAGNOSIS — K219 Gastro-esophageal reflux disease without esophagitis: Secondary | ICD-10-CM | POA: Diagnosis not present

## 2016-02-14 DIAGNOSIS — R609 Edema, unspecified: Secondary | ICD-10-CM | POA: Diagnosis not present

## 2016-02-14 DIAGNOSIS — R509 Fever, unspecified: Secondary | ICD-10-CM | POA: Diagnosis not present

## 2016-02-14 DIAGNOSIS — A419 Sepsis, unspecified organism: Secondary | ICD-10-CM | POA: Diagnosis not present

## 2016-02-14 DIAGNOSIS — O85 Puerperal sepsis: Secondary | ICD-10-CM | POA: Diagnosis not present

## 2016-02-14 NOTE — Assessment & Plan Note (Signed)
Hgb 9.7 11/12/16, update CBC

## 2016-02-14 NOTE — Assessment & Plan Note (Signed)
Chronic depression/anxiety is present, taking Prozac 20mg  daily, Clonopin 0.5mg  bid, currently taking Seroquel, but desires to go back Abilify.

## 2016-02-14 NOTE — Assessment & Plan Note (Signed)
Reason chronic pain syndrome related to contracture joints and spasticity. She is on multiple medications for this.  Chronic neck and back pains. Prn Oxycodone, taking Xanaflex 4mg  bid

## 2016-02-14 NOTE — Assessment & Plan Note (Signed)
5mL balloon on a 22-Fr catheter hoping this would prevent the bladder spasm from pushing the balloon down the urethra (Dr Logan BoresEvans, Urology [facility], July 2016)

## 2016-02-14 NOTE — Assessment & Plan Note (Signed)
Chronic conditions include cerebral palsyspastic nature. 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  History of dyspnea which was evaluated by Dr. Marchelle Gearingamaswamy. Etiology of this problem is uncertain.  Patient has a history of DVT and pulmonary embolism. She also has a [employer] forwarderumbrella filter. She has been on chronic anticoagulation in the past, but is off this at present time.

## 2016-02-14 NOTE — Assessment & Plan Note (Signed)
Seizure free since last visited, continue Lamictal 100mg  qhs.

## 2016-02-14 NOTE — Assessment & Plan Note (Signed)
Continue Clonopin 0.5mg  bid, Abilify 5mg , off Seroquel, continue Prozac 20mg  daily.

## 2016-02-14 NOTE — Assessment & Plan Note (Addendum)
She has GERD, may be the etiology of c/o abd pain, add Omeprazole 20mg  daily. Prn Phenergan, zofran available to her. KUB to evaluate c/o abd pain, observe the patient.

## 2016-02-14 NOTE — Assessment & Plan Note (Signed)
02/08/2012 through 02/10/2012. Patient had a urologic procedure at [facility]. She had an area that opened between the bladder and the vagina and subsequent urine leakage through that area. This was repaired. She has a suprapubic catheter and this was exchanged during her hospitalization.

## 2016-02-14 NOTE — Assessment & Plan Note (Signed)
Controlled w/o meds 

## 2016-02-14 NOTE — Progress Notes (Signed)
Patient ID: [name] SlickerJudith [name], female   DOB: [birth year], 40 y.o.   MRN: [id number]  [facility] Room Number: 413 B Place of Service:  SNF (31) Provider: Arna SnipeManXie Jinx Gilden NP  GREEN, Lenon CurtARTHUR G, MD  Patient Care Team: Kimber RelicArthur G Green, MD as PCP - General (Internal Medicine) Jamison Neighborobert J Evans, MD as Consulting Physician (Urology) Deetta PerlaWilliam H Hickling, MD as Consulting Physician (Neurology) Glori LuisEric G Sonnenberg, MD (Family Medicine) Levert FeinsteinYijun Yan, MD as Consulting Physician (Neurology)  Extended Emergency Contact Information Primary Emergency Contact: Roderick,[name] Address: [address]          [place], [place] Home Phone: [phone] Relation: Father Secondary Emergency Contact: Melanie CrazierMcCraven,Lee  [place] Home Phone: [phone] Relation: Friend  Code Status:  DNR Goals of care: Advanced Directive information Advanced Directives 02/12/2016  Does patient have an advance directive? No  Would patient like information on creating an advanced directive? No - patient declined information     Chief Complaint  Patient presents with  . Abdominal Pain    patient is having right upper quadrant pain     HPI:  Pt is a 40 y.o. female seen today for medical management of chronic diseases.  C/o abd pain, no nausea or vomiting, abd soft and non tender upon my examination.    Reason chronic pain syndrome related to contracture joints and spasticity. She is on multiple medications for this.  There is a history of prior seizure disorder. Last seizure was approximately 2010. Takes Lamictal 100mg  qhs  Chronic neck and back pains. Prn Oxycodone, taking Xanaflex 4mg  bid  She has GERD, may be the etiology of c/o abd pain, add Omeprazole 20mg  daily. Prn Phenergan, zofran available to her.   Chronic depression/anxiety is present, taking Prozac 20mg  daily, Clonopin 0.5mg  bid, currently taking Seroquel, but desires to go back Abilify.    History of dyspnea which was evaluated by Dr. Marchelle Gearingamaswamy. Etiology of this problem is uncertain.  Patient has a history of DVT and pulmonary embolism. She also has a Freight forwarderumbrella filter. She has been on chronic anticoagulation in the past, but is off this at present time.   Past Medical History  Diagnosis Date  . Cerebral palsy ([name])   . Pulmonary embolism ([name])     Lifetime Coumadin  . Cerebral palsy ([name])     Spastic Cerebral palsy, mentally intact  . Contracture, joint, multiple sites     Electric wheelchair, uses left hand to operate chair.   [name] Kitchen. Hypertension   . OCD (obsessive compulsive disorder)   . Depression   . Migraine   . [name]   . Dysarthria   . Endometriosis   . History of recurrent UTIs   . GERD (gastroesophageal reflux disease)   . Pulmonary embolism ([name]) 2011, 01/2011    Will be on lifetime coumadin  . Anemia   . Abdominal pain 01/02/2012    Overview:  Overview:  Per Previous pcp note- Dr. Ta--Pt has history of endometriosis and had DUB in 2011.  She She had u/s that showed normal endometrial stripe.  She had endometrial bx that was wnl.  She has a lot of abd cramping every month.  This cramping was sometimes not relieved with hydrocodone.  She was referred to [facility] for IUD placement to help endometriosis and abd cramping.  [name]  . Macroscopic hematuria 03/10/2013    status post replacement of suprapubic tube 03/04/2013   . HYPERTENSION,  BENIGN 01/31/2010    Qualifier: Diagnosis of  By: [name], MD, [name] History of endometriosis 10/2012    OBGYN [facility]: Laparoscopic Endometrial Ablation, TVH,  . Gram positive sepsis ([name]) 10/24/2014  . HCAP (healthcare-associated pneumonia) 10/25/2014  . Abdominal pain 01/02/2012    Overview:  Overview:  Per Previous pcp note- Dr. Ta--Pt has history of endometriosis and had DUB in 2011.  She She had u/s that showed normal endometrial stripe.  She had endometrial bx that was wnl.  She has a lot  of abd cramping every month.  This cramping was sometimes not relieved with hydrocodone.  She was referred to [facility] for IUD placement to help endometriosis and abd cramping.  [name] was placed 04/30/11 by [facility].  Pt still complains of abd cramping, which I am treating with hydrocodone and ultram. Pt reports some initial relief of pain from IUD, but states it has now returned back to similar to previous cramping. Gave patient a shot of Toradol 60 mg IM today-since positive exacerbation of abdominal cramping during past 2 weeks. Physical exam reassuring. Patient may need to return [facility] for further workup since she is plugged in with the GYN providers there for further treatment options regarding endometriosis and abdominal cramping. The dysfunctional uterine bleeding now well contr  . Acute respiratory failure with hypoxia ([name])   . Cellulitis 12/22/2014  . Disease of female genital organs 10/24/2010    Overview:  Overview:  Pt has history of endometriosis and had DUB in 2011.  She had u/s that showed normal endometrial stripe.  She had endometrial bx that was wnl.  She has a lot of abd cramping every month.  This cramping was sometimes not relieved with hydrocodone.  She was referred to [facility] for IUD placement to help endometriosis and abd cramping.  [name] was placed 04/30/11 by [facility].  Pt still complains of abd cramping, which was treated with hydrocodone and ultram.   Last seen at [facility] by OB/GYN 12/24/11, given Depo Lupron injection.  Ordering CT scan to r/o additional cause of abdominal pain (pt with known endometriosis and will not tolerate vaginal U/S, pain not improved s/p [name] or with Lupron injection-- only caused hot flashes and amenorrhea, no pain relief).  Also made referral to pain clinic for better pain control.  Do not think hysterectomy would help with pain, and concern for contamination of baclofen pump if did proceed with surgery.    [facility]13[name]: Pt was seen  by   . Dyspnea and respiratory abnormality 02/04/2011    Overview:  Overview:  Pt has history of recurrent PE and is on chronic coumadin.  Pt has been complaining of dyspnea that she describes as chest tightness.  She has been to the ER multiple times for this.  Usually she gets CTA and serial CE.  She has had at least 6 CTA in a period of since 2011.  She also has an O2 requirement. It is unclear why pt feels dyspneic, complains of chest tightness, or has O2 requirement.  I thought that her complaint may be from deconditioning and referred her to PT.  Unfortunately Medicaid only pays for 4 PT visits.  Pt should be doing these exercises at home, but she has not.  I have referred her to [facility], and she has been seen by [name].  He did not understand the etiology of her dyspnea and O2 requirement.  He will continue to follow her.  Last Assessment & Plan:  Pt presents with concerns for chest tightness and dyspnea.  She was seen in ED yesterday and discharged home.  While there she had a CT chest 04/20/11 showed 1.  Multifocal degradation as detailed above.  N  . Gross hematuria 03/12/2013    Overview:  Overview:  status post replacement of suprapubic tube 03/04/2013  Last Assessment & Plan:  Recent urine culture collected in ED on 5/4 did not result in significant growth. Moreover, patient has been asymptomatic with normal WBC, no fever and no true symptoms making symptomatic UTI less likely.  Will continue to monitor for any sign of infection.  Patient's xarelto had been held during her recent hospitalization due to hematuria. This has since been re-started. Monitor bleeding.    [name] Kitchen History of anticoagulant therapy 10/21/2012  . Infantile cerebral palsy ([name]) 01/29/2007    Overview:  She has spastic CP.  She is mentally intact.  She is wheelchair bound.  She is wheelchair bound for ambulation      . Intracervical pessary 05/07/2011    Overview:  Overview:  Placed by [facility] 04/30/11 to treat DUB and  Endometriosis.  She is seen at GYN clinic at [facility] but was considered a poor surgical candidate and referred her to [facility].  For DUB she had pelvic ultrasound that showed thin stripe and she had endometrial Bx by Dr Jennette Kettle in GYN clinic, which was negative.     . Parapneumonic effusion 10/25/2014  . Presence of intrathecal baclofen pump 03/07/2014    R LQ. Insertion T10.    [name] Kitchen Psychiatric illness 10/31/2014  . Recurrent pulmonary embolism ([name]) 05/03/2011    Pt has history of recurrent PE and is on chronic coumadin.  Pt has been complaining of dyspnea that she describes as chest tightness.  She has been to the ER multiple times for this.  Usually she gets CTA and serial CE.  She has had at least 6 CTA in a period of since 2011.  She also has an O2 requirement. It is unclear why pt feels dyspneic, complains of chest tightness, or has O2 requirement.  I thought that her complaint may be from deconditioning and referred her to PT.  Unfortunately Medicaid only pays for 4 PT visits.  Pt should be doing these exercises at home, but she has not.  I have referred her to [facility], and she has been seen by [name].  He did not understand the etiology of her dyspnea and O2 requirement.  He will continue to follow her.     . Seborrheic keratosis 01/18/2011  . Spasticity 01/05/2014  . Transient alteration of awareness, recurrent 09/08/2006    Recurrent episodes where Layney  loses contact with her world and that have been studied thoroughly and appear nonepileptic in nature per Dr Terrace Arabia (neurology) The patient has had episodes starting in 2007 that initially began 1-4 times a day during which time she would have periods of unawareness followed by confusion. This continuous video EEG monitoring performed from October 8-12, 2007. Had   . Acute cystitis without hematuria   . Acute cystitis with hematuria   . Urethra dilated and patulous 07/03/2015    Patulous, Dilated Urethra. 5mL balloon on a 22-Fr catheter hoping this would  prevent the bladder spasm from pushing the balloon down the urethra (Dr Logan Bores, Urology [facility], July 2016)   . Seizures ([name]) 02/12/2016   Past Surgical History  Procedure Laterality Date  . Colposcopy  06/2000  .  Cesarean section      x 2  . Tubal ligation  2003  . Carpal tunnel release  08/2008    Dr Teressa Senter  . Wrist surgery  06/2010    Dr Dierdre Searles, hand surgeon, [name]  . Baclofen pump refill      x 3 times  . Appendectomy    . Intrauterine device insertion  04/30/11    Inserted by [facility] for endometriosis  . Laparoscopically assisted vag hysterectomy  10/27/2012  . Multiple extractions with alveoloplasty Bilateral 07/19/2013    Procedure: EXTRACTIONS #4, 1,61,09,60;  Surgeon: Francene Finders, DDS;  Location: [facility] OR;  Service: Oral Surgery;  Laterality: Bilateral;  . Pain pump implantation N/A 03/07/2014    Procedure: baclofen pump revision/replacement and Catheter connection replacement;  Surgeon: Cristi Loron, MD;  Location: [facility];  Service: Neurosurgery;  Laterality: N/A;  baclofen pump revision/replacement and Catheter connection replacement  . Programable baclofen pump revision  03/17/14    Battery Replacement   . Cholecystectomy  10/14/2015    Procedure: LAPAROSCOPIC CHOLECYSTECTOMY;  Surgeon: Violeta Gelinas, MD;  Location: [facility] OR;  Service: General;;  . Abdominal hysterectomy    . Neuroma surgery Left     anterior and posterior intersosseous    Allergies  Allergen Reactions  . Morphine Dermatitis and Other (See Comments)    Skin turned red      Medication List       This list is accurate as of: 02/14/16 11:59 PM.  Always use your most recent med list.               acetaminophen 325 MG tablet  Commonly known as:  TYLENOL  Take 650 mg by mouth every 6 (six) hours as needed for moderate pain.     baclofen 20 MG tablet  Commonly known as:  LIORESAL  Take 20 mg by mouth 4 (four) times daily. For bladder spasms     bisacodyl 10 MG suppository    Commonly known as:  DULCOLAX  Place 10 mg rectally daily as needed for moderate constipation.     clonazePAM 0.5 MG tablet  Commonly known as:  KLONOPIN  Take 0.5 mg by mouth 2 (two) times daily.     docusate sodium 100 MG capsule  Commonly known as:  COLACE  Take 1 capsule (100 mg total) by mouth daily as needed for mild constipation.     feeding supplement (ENSURE ENLIVE) Liqd  Take 237 mLs by mouth 2 (two) times daily between meals.     flavoxATE 100 MG tablet  Commonly known as:  URISPAS  Take 100 mg by mouth 3 (three) times daily as needed for bladder spasms.     FLUoxetine 20 MG tablet  Commonly known as:  PROZAC  Take 20 mg by mouth daily.     ibuprofen 200 MG tablet  Commonly known as:  ADVIL,MOTRIN  Take 400 mg by mouth every 8 (eight) hours as needed for moderate pain.     lamoTRIgine 100 MG tablet  Commonly known as:  LAMICTAL  Take 100 mg by mouth at bedtime.     magnesium hydroxide 400 MG/5ML suspension  Commonly known as:  MILK OF MAGNESIA  Take 30 mLs by mouth daily as needed for mild constipation.     multivitamin tablet  Take 1 tablet by mouth daily.     ondansetron 8 MG disintegrating tablet  Commonly known as:  ZOFRAN-ODT  Take 1 tablet (8 mg total) by  mouth every 8 (eight) hours as needed for nausea or vomiting.     oxyCODONE 5 MG immediate release tablet  Commonly known as:  Oxy IR/ROXICODONE  Take one tablet by mouth every 4 hours as needed for pain     promethazine 12.5 MG tablet  Commonly known as:  PHENERGAN  Take 12.5 mg by mouth every 6 (six) hours as needed for nausea or vomiting.     QUEtiapine 100 MG tablet  Commonly known as:  SEROQUEL  Take 1 tablet (100 mg total) by mouth at bedtime.     tiZANidine 4 MG capsule  Commonly known as:  ZANAFLEX  Take 4 mg by mouth 2 (two) times daily at 8 am and 10 pm.        Review of Systems  Constitutional: Negative for fever, chills, diaphoresis, activity change, appetite change, fatigue  and unexpected weight change.  HENT: Negative for congestion, ear discharge, ear pain, hearing loss, postnasal drip, rhinorrhea, sore throat, tinnitus, trouble swallowing and voice change.   Eyes: Positive for visual disturbance (corrective lenses). Negative for pain, redness and itching.  Respiratory: Positive for shortness of breath. Negative for cough, choking and wheezing.   Cardiovascular: Positive for leg swelling. Negative for chest pain and palpitations.  Gastrointestinal: Positive for constipation. Negative for nausea, abdominal pain, diarrhea and abdominal distention.       GERD  Endocrine: Negative for cold intolerance, heat intolerance, polydipsia, polyphagia and polyuria.  Genitourinary: Negative for dysuria, urgency, frequency, hematuria, flank pain, vaginal discharge, difficulty urinating and pelvic pain.       Suprapubic catheter. History of urethral closure.  Musculoskeletal: Negative for myalgias, back pain, arthralgias, gait problem, neck pain and neck stiffness.       Spastic cerebral palsy with multiple joint contractures  Skin: Negative for color change, pallor and rash.  Allergic/Immunologic: Negative.   Neurological: Negative for dizziness, tremors, seizures, syncope, weakness, numbness and headaches.       Cerebral palsy from birth. Spastic and athetoid movements present.  Hematological: Negative for adenopathy. Does not bruise/bleed easily.       History of anemia  Psychiatric/Behavioral: Positive for sleep disturbance and dysphoric mood. Negative for suicidal ideas, hallucinations, behavioral problems, confusion and agitation. The patient is nervous/anxious. The patient is not hyperactive.        Chronic anxiety    Immunization History  Administered Date(s) Administered  . H1N1 11/04/2008  . Influenza Whole 11/09/2008, 10/05/2009  . Influenza,inj,Quad PF,36+ Mos 10/24/2014  . Influenza-Unspecified 09/13/2015  . Pneumococcal Polysaccharide-23 10/24/2014  . Td  12/02/1994, 03/18/2008   Pertinent  Health Maintenance Due  Topic Date Due  . PAP SMEAR  11/12/2013  . INFLUENZA VACCINE  07/02/2016   Fall Risk  02/06/2016 12/22/2015  Falls in the past year? Exclusion - non ambulatory Exclusion - non ambulatory   Functional Status Survey:    Filed Vitals:   02/14/16 1202  BP: 136/84  Pulse: 87  Temp: 98.7 F (37.1 C)  TempSrc: Oral  Resp: 20  Height: 5' (1.524 m)  Weight: 136 lb (61.689 kg)  SpO2: 95%   Body mass index is 26.56 kg/(m^2). Physical Exam  Constitutional: She is oriented to person, place, and time. She appears well-developed and well-nourished. No distress.  HENT:  Head: Normocephalic and atraumatic.  Right Ear: External ear normal.  Left Ear: External ear normal.  Nose: Nose normal.  Mouth/Throat: Oropharynx is clear and moist. No oropharyngeal exudate.  Eyes: Conjunctivae and EOM are normal. Pupils are  equal, round, and reactive to light. No scleral icterus.  Neck: Normal range of motion. Neck supple. No JVD present. No tracheal deviation present. No thyromegaly present.  Cardiovascular: Normal rate, regular rhythm, normal heart sounds and intact distal pulses.  Exam reveals no gallop and no friction rub.   No murmur heard. Pulmonary/Chest: Effort normal and breath sounds normal. No respiratory distress. She has no wheezes. She has no rales. She exhibits no tenderness.  Abdominal: Soft. Bowel sounds are normal. She exhibits no distension and no mass. There is no tenderness.  Suprapubic foley  Genitourinary:  Suprapubic Foley catheter  Musculoskeletal: Normal range of motion. She exhibits edema (trace). She exhibits no tenderness.  Multiple joint contractures  Lymphadenopathy:    She has no cervical adenopathy.  Neurological: She is alert and oriented to person, place, and time. No cranial nerve deficit. Coordination normal.  Spastic cerebral palsy with athetoid movements  Skin: Skin is warm and dry. No rash noted. She is  not diaphoretic. No erythema. No pallor.  Psychiatric: She has a normal mood and affect. Her behavior is normal. Judgment and thought content normal.    Labs reviewed:  Recent Labs  10/09/15 0500 10/09/15 1211  10/24/15 0545  10/27/15 0345 10/28/15 0615 10/29/15 0326 11/13/15  NA  --   --   < >  --   < > 138 137 140 140  K  --   --   < >  --   < > 3.4* 4.5 3.7 4.1  CL  --   --   < >  --   < > 105 102 104  --   CO2  --   --   < >  --   < > --   GLUCOSE  --   --   < >  --   < > 108* 117* 100*  --   BUN  --   --   < >  --   < > <5* <5* <5* 10  CREATININE  --   --   < >  --   < > 0.44 0.42* 0.46 0.5  CALCIUM  --   --   < >  --   < > 8.4* 8.7* 8.8*  --   MG 1.6* 2.7*  --  2.0  --   --   --   --   --   PHOS 2.7  --   --   --   --   --   --   --   --   < > = values in this interval not displayed.  Recent Labs  10/18/15 1041 10/19/15 0555 10/20/15 0520 11/13/15  AST 20 11* 14* 15  ALT ALKPHOS 200* 184* 162* 133*  BILITOT 0.5 0.7 0.6  --   PROT 5.5* 5.4* 5.7*  --   ALBUMIN 2.2* 2.2* 2.1*  --     Recent Labs  10/08/15 2339  10/19/15 0555  10/23/15 0450  10/27/15 0345 10/28/15 0615 10/29/15 0326 11/13/15  WBC 9.8  < > 5.9  < >  --   < > 6.3 6.2 6.3 8.2  NEUTROABS 7.2  --  3.7  --  9.6*  --   --   --   --   --   HGB 9.5*  < > 8.5*  < >  --   < > 9.5* 9.5* 10.5* 9.7*  HCT 30.1*  < >  27.6*  < >  --   < > 31.9* 31.6* 34.1* 32*  MCV 87.2  < > 92.0  < >  --   < > 92.5 92.4 92.4  --   PLT 379  < > 356  < >  --   < > 387 389 342 389  < > = values in this interval not displayed. Lab Results  Component Value Date   TSH 0.710 06/12/2015   Lab Results  Component Value Date   HGBA1C 6.0 07/25/2009   Lab Results  Component Value Date   CHOL 150 01/29/2007   HDL 41 01/29/2007   LDLCALC 92 01/29/2007   TRIG 83 01/29/2007   CHOLHDL 3.7 Ratio 01/29/2007    Significant Diagnostic Results in last 30 days:  No results found.  Assessment/Plan  Absence  of bladder continence 02/08/2012 through 02/10/2012. Patient had a urologic procedure at [facility]. She had an area that opened between the bladder and the vagina and subsequent urine leakage through that area. This was repaired. She has a suprapubic catheter and this was exchanged during her hospitalization.    Anemia of chronic disease Hgb 9.7 11/12/16, update CBC  Anxiety state Chronic depression/anxiety is present, taking Prozac 20mg  daily, Clonopin 0.5mg  bid, currently taking Seroquel, but desires to go back Abilify.      Cerebral palsy, quadriplegic ([name]) Chronic conditions include cerebral palsyspastic nature. 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  History of dyspnea which was evaluated by Dr. Marchelle Gearing. Etiology of this problem is uncertain.  Patient has a history of DVT and pulmonary embolism. She also has a Freight forwarder. She has been on chronic anticoagulation in the past, but is off this at present time.   Chronic pain syndrome Reason chronic pain syndrome related to contracture joints and spasticity. She is on multiple medications for this.  Chronic neck and back pains. Prn Oxycodone, taking Xanaflex 4mg  bid     Chronic suprapubic catheter ([name]) 5mL balloon on a 22-Fr catheter hoping this would prevent the bladder spasm from pushing the balloon down the urethra (Dr Logan Bores, Urology [facility], July 2016)    GERD (gastroesophageal reflux disease) She has GERD, may be the etiology of c/o abd pain, add Omeprazole 20mg  daily. Prn Phenergan, zofran available to her. KUB to evaluate c/o abd pain, observe the patient.    HYPERTENSION, BENIGN Controlled w/o meds.   Major depressive disorder, recurrent episode ([name]) Continue Clonopin 0.5mg  bid, Abilify 5mg , off Seroquel, continue Prozac 20mg  daily.   Seizures ([name]) Seizure free since last visited, continue Lamictal 100mg  qhs.     Family/ staff Communication: Continue SNF for care needs.   Labs/tests  ordered: KUB, UA C/S, CBC

## 2016-02-16 DIAGNOSIS — G809 Cerebral palsy, unspecified: Secondary | ICD-10-CM | POA: Diagnosis not present

## 2016-02-16 LAB — HEPATIC FUNCTION PANEL
ALK PHOS: 90 U/L (ref 25–125)
ALT: 17 U/L (ref 7–35)
AST: 13 U/L (ref 13–35)
BILIRUBIN, TOTAL: 0.5 mg/dL

## 2016-02-16 LAB — CBC AND DIFFERENTIAL
HEMATOCRIT: 37 % (ref 36–46)
Hemoglobin: 11.9 g/dL — AB (ref 12.0–16.0)
Platelets: 221 10*3/uL (ref 150–399)
WBC: 5.3 10^3/mL

## 2016-02-16 LAB — BASIC METABOLIC PANEL
BUN: 6 mg/dL (ref 4–21)
CREATININE: 0.5 mg/dL (ref 0.5–1.1)
Glucose: 87 mg/dL
Potassium: 4 mmol/L (ref 3.4–5.3)
SODIUM: 141 mmol/L (ref 137–147)

## 2016-02-17 DIAGNOSIS — R1084 Generalized abdominal pain: Secondary | ICD-10-CM | POA: Diagnosis not present

## 2016-02-20 DIAGNOSIS — Z9350 Unspecified cystostomy status: Secondary | ICD-10-CM | POA: Diagnosis not present

## 2016-02-22 ENCOUNTER — Ambulatory Visit (INDEPENDENT_AMBULATORY_CARE_PROVIDER_SITE_OTHER): Payer: Medicare Other | Admitting: Family

## 2016-02-22 ENCOUNTER — Encounter: Payer: Self-pay | Admitting: Family

## 2016-02-22 VITALS — BP 130/84 | HR 86 | Ht 59.0 in | Wt 133.0 lb

## 2016-02-22 DIAGNOSIS — R258 Other abnormal involuntary movements: Secondary | ICD-10-CM | POA: Diagnosis not present

## 2016-02-22 DIAGNOSIS — R252 Cramp and spasm: Secondary | ICD-10-CM

## 2016-02-22 DIAGNOSIS — R471 Dysarthria and anarthria: Secondary | ICD-10-CM

## 2016-02-22 DIAGNOSIS — F411 Generalized anxiety disorder: Secondary | ICD-10-CM | POA: Diagnosis not present

## 2016-02-22 DIAGNOSIS — Z593 Problems related to living in residential institution: Secondary | ICD-10-CM

## 2016-02-22 DIAGNOSIS — G803 Athetoid cerebral palsy: Secondary | ICD-10-CM | POA: Diagnosis not present

## 2016-02-22 DIAGNOSIS — R259 Unspecified abnormal involuntary movements: Secondary | ICD-10-CM | POA: Insufficient documentation

## 2016-02-22 NOTE — Progress Notes (Signed)
Patient: [name] [name] MRN: [id number] Sex: female DOB: [birth year]  Provider: Elveria Risingina Quatisha Zylka, NP [facility]  Note type: Routine return visit  History of Present Illness: Referral Source: Dr. Murray HodgkinsArthur Green History from: patient, referring office and [facility] Chief Complaint: Spastic quadripareses  [name] [name] is a 40 y.o. 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 She has episodes of transient alteration of awareness the appear to be nonepileptic events. She receives Botox injections for spasticity in her right arm that cannot be treated with intrathecal baclofen. She has chronic kyphoscoliosis, and weakness in extending her head to a neutral position. She was last seen by Dr Sharene SkeansHickling on December 26, 2015 for intrathecal baclofen pump refilling. [name] [name] returns today complaining of "spasms" and tells me that she has been having increase in spasms in her extremities and trunk over the last month or so. When she was last seen at this office in January, she was taking Clonazepam 1mg  four times per day. She tells me today that sometime last month, the Clonazepam dose was decreased to 0.5mg  twice per day. [name] [name] says that since then, she has had increase in spasms and wants the dose to be increased. She does not know why the dose was decreased. [name] [name] describes the spasms as involuntary jerking movements of her extremities and intermittent twitching or jerking of her trunk. She believes that the spasms are worse when she is lying down in bed than when she is sitting in her chair.   [name] [name] says that she has been otherwise generally healthy since her last visit. I reviewed her chart and since January, she has been seen by other providers for urinary incontinence,constipation, swelling in her legs, and  for Botox administration. She has no other health concerns today other than previously mentioned.  Review of Systems: Please see the HPI for neurologic and other pertinent review of systems. Otherwise, the following systems are noncontributory including constitutional, eyes, ears, nose and throat, cardiovascular, respiratory, gastrointestinal, genitourinary, musculoskeletal, skin, endocrine, hematologic/lymph, allergic/immunologic and psychiatric.   Past Medical History  Diagnosis Date  . Cerebral palsy ([name])   . Pulmonary embolism ([name])     Lifetime Coumadin  . Cerebral palsy ([name])     Spastic Cerebral palsy, mentally intact  . Contracture, joint, multiple sites     Electric wheelchair, uses left hand to operate chair.   [name] Kitchen. Hypertension   . OCD (obsessive compulsive disorder)   . Depression   . Migraine   . [name]   . Dysarthria   . Endometriosis   . History of recurrent UTIs   . GERD (gastroesophageal reflux disease)   . Pulmonary embolism ([name]) 2011, 01/2011    Will be on lifetime coumadin  . Anemia   . Abdominal pain 01/02/2012    Overview:  Overview:  Per Previous pcp note- Dr. Ta--Pt has history of endometriosis and had DUB in 2011.  She She had u/s that showed normal endometrial stripe.  She had endometrial bx that was wnl.  She has a lot of abd cramping every month.  This cramping was sometimes not relieved with hydrocodone.  [facility] for IUD placement to help endometriosis and abd cramping.  [name]  . Macroscopic hematuria 03/10/2013    status post replacement of suprapubic tube 03/04/2013   . HYPERTENSION, BENIGN 01/31/2010  Qualifier: Diagnosis of  By: [name], MD, [name] History of endometriosis 10/2012    OBGYN [facility]: Laparoscopic Endometrial Ablation, TVH,  . Gram positive sepsis ([name]) 10/24/2014  . HCAP (healthcare-associated pneumonia) 10/25/2014  . Abdominal pain 01/02/2012    Overview:  Overview:  Per Previous  pcp note- Dr. Ta--Pt has history of endometriosis and had DUB in 2011.  She She had u/s that showed normal endometrial stripe.  She had endometrial bx that was wnl.  She has a lot of abd cramping every month.  This cramping was sometimes not relieved with hydrocodone.  She was referred to [facility] for IUD placement to help endometriosis and abd cramping.  [name] was placed 04/30/11 by [facility].  Pt still complains of abd cramping, which I am treating with hydrocodone and ultram. Pt reports some initial relief of pain from IUD, but states it has now returned back to similar to previous cramping. Gave patient a shot of Toradol 60 mg IM today-since positive exacerbation of abdominal cramping during past 2 weeks. Physical exam reassuring. Patient may need to return to [facility] for further workup since she is plugged in with the GYN providers there for further treatment options regarding endometriosis and abdominal cramping. The dysfunctional uterine bleeding now well contr  . Acute respiratory failure with hypoxia ([name])   . Cellulitis 12/22/2014  . Disease of female genital organs 10/24/2010    Overview:  Overview:  Pt has history of endometriosis and had DUB in 2011.  She had u/s that showed normal endometrial stripe.  She had endometrial bx that was wnl.  She has a lot of abd cramping every month.  This cramping was sometimes not relieved with hydrocodone.  She was referred to [facility] for IUD placement to help endometriosis and abd cramping.  [name] was placed 04/30/11 by [facility].  Pt still complains of abd cramping, which was treated with hydrocodone and ultram.   Last seen at [facility] by OB/GYN 12/24/11, given Depo Lupron injection.  Ordering CT scan to r/o additional cause of abdominal pain (pt with known endometriosis and will not tolerate vaginal U/S, pain not improved s/p [name] or with Lupron injection-- only caused hot flashes and amenorrhea, no pain relief).  Also made referral to pain clinic for  better pain control.  Do not think hysterectomy would help with pain, and concern for contamination of baclofen pump if did proceed with surgery.    8/[facility]/13[name]: Pt was seen by   . Dyspnea and respiratory abnormality 02/04/2011    Overview:  Overview:  Pt has history of recurrent PE and is on chronic coumadin.  Pt has been complaining of dyspnea that she describes as chest tightness.  She has been to the ER multiple times for this.  Usually she gets CTA and serial CE.  She has had at least 6 CTA in a period of since 2011.  She also has an O2 requirement. It is unclear why pt feels dyspneic, complains of chest tightness, or has O2 requirement.  I thought that her complaint may be from deconditioning and referred her to PT.  Unfortunately Medicaid only pays for 4 PT visits.  Pt should be doing these exercises at home, but she has not.  I have referred her to [facility], and she has been seen by [name].  He did not understand the etiology of her dyspnea and O2 requirement.  He will continue to follow her.   Last Assessment & Plan:  Pt presents with concerns for chest tightness and dyspnea.  She was seen in ED yesterday and discharged home.  While there she had a CT chest 04/20/11 showed 1.  Multifocal degradation as detailed above.  N  . Gross hematuria 03/12/2013    Overview:  Overview:  status post replacement of suprapubic tube 03/04/2013  Last Assessment & Plan:  Recent urine culture collected in ED on 5/4 did not result in significant growth. Moreover, patient has been asymptomatic with normal WBC, no fever and no true symptoms making symptomatic UTI less likely.  Will continue to monitor for any sign of infection.  Patient's xarelto had been held during her recent hospitalization due to hematuria. This has since been re-started. Monitor bleeding.    [name] Kitchen History of anticoagulant therapy 10/21/2012  . Infantile cerebral palsy ([name]) 01/29/2007    Overview:  She has spastic CP.  She is mentally intact.  She  is wheelchair bound.  She is wheelchair bound for ambulation      . Intracervical pessary 05/07/2011    Overview:  Overview:  Placed by [facility] 04/30/11 to treat DUB and Endometriosis.  She is seen at GYN clinic at [facility] but was considered a poor surgical candidate and referred her to [facility].  For DUB she had pelvic ultrasound that showed thin stripe and she had endometrial Bx by Dr Jennette Kettle in GYN clinic, which was negative.     . Parapneumonic effusion 10/25/2014  . Presence of intrathecal baclofen pump 03/07/2014    R LQ. Insertion T10.    [name] Kitchen Psychiatric illness 10/31/2014  . Recurrent pulmonary embolism ([name]) 05/03/2011    Pt has history of recurrent PE and is on chronic coumadin.  Pt has been complaining of dyspnea that she describes as chest tightness.  She has been to the ER multiple times for this.  Usually she gets CTA and serial CE.  She has had at least 6 CTA in a period of since 2011.  She also has an O2 requirement. It is unclear why pt feels dyspneic, complains of chest tightness, or has O2 requirement.  I thought that her complaint may be from deconditioning and referred her to PT.  Unfortunately Medicaid only pays for 4 PT visits.  Pt should be doing these exercises at home, but she has not.  I have referred her to [facility], and she has been seen by [name].  He did not understand the etiology of her dyspnea and O2 requirement.  He will continue to follow her.     . Seborrheic keratosis 01/18/2011  . Spasticity 01/05/2014  . Transient alteration of awareness, recurrent 09/08/2006    Recurrent episodes where Abeeha  loses contact with her world and that have been studied thoroughly and appear nonepileptic in nature per Dr Terrace Arabia (neurology) The patient has had episodes starting in 2007 that initially began 1-4 times a day during which time she would have periods of unawareness followed by confusion. This continuous video EEG monitoring performed from October 8-12, 2007. Had   . Acute  cystitis without hematuria   . Acute cystitis with hematuria   . Urethra dilated and patulous 07/03/2015    Patulous, Dilated Urethra. 5mL balloon on a 22-Fr catheter hoping this would prevent the bladder spasm from pushing the balloon down the urethra (Dr Logan Bores, Urology [facility], July 2016)   . Seizures ([name]) 02/12/2016   Hospitalizations: No., Head Injury: No., Nervous System Infections: No., Immunizations up to date: Yes.   Past  Medical History Comments: See history  Surgical History Past Surgical History  Procedure Laterality Date  . Colposcopy  06/2000  . Cesarean section      x 2  . Tubal ligation  2003  . Carpal tunnel release  08/2008    Dr Teressa Senter  . Wrist surgery  06/2010    Dr Dierdre Searles, hand surgeon, [name]  . Baclofen pump refill      x 3 times  . Appendectomy    . Intrauterine device insertion  04/30/11    Inserted by [facility] for endometriosis  . Laparoscopically assisted vag hysterectomy  10/27/2012  . Multiple extractions with alveoloplasty Bilateral 07/19/2013    Procedure: EXTRACTIONS #4, 1,61,09,60;  Surgeon: Francene Finders, DDS;  [facility] OR;  Service: Oral Surgery;  Laterality: Bilateral;  . Pain pump implantation N/A 03/07/2014    Procedure: baclofen pump revision/replacement and Catheter connection replacement;  Surgeon: Cristi Loron, MD;  Location: [facility];  Service: Neurosurgery;  Laterality: N/A;  baclofen pump revision/replacement and Catheter connection replacement  . Programable baclofen pump revision  03/17/14    Battery Replacement   . Cholecystectomy  10/14/2015    Procedure: LAPAROSCOPIC CHOLECYSTECTOMY;  Surgeon: Violeta Gelinas, MD;  Location: [facility] OR;  Service: General;;  . Abdominal hysterectomy    . Neuroma surgery Left     anterior and posterior intersosseous    Family History family history includes Alzheimer's disease in her paternal grandfather; Asthma in her father; Breast cancer in her paternal grandmother; Cancer in her paternal  grandmother; Colon cancer in her maternal grandmother; Heart attack in her maternal grandfather. Family History is otherwise negative for migraines, seizures, cognitive impairment, blindness, deafness, birth defects, chromosomal disorder, autism.  Social History Social History   Social History  . Marital Status: Divorced    Spouse Name: N/A  . Number of Children: 2  . Years of Education: college   Occupational History  .    [name] Kitchen UNEMPLOYED    Social History Main Topics  . Smoking status: Never Smoker   . Smokeless tobacco: Never Used  . Alcohol Use: 0.6 oz/week    1 Glasses of wine per week     Comment: Drinks alcohol once a month.  . Drug Use: No  . Sexual Activity: No   Other Topics Concern  . 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    She enjoys shopping.   Lives in a nursing facility,[facility].      Ailene Ards, who has schizophrenia, MR, is father of daughter.  Homero Fellers and pt are no longer together.  Homero Fellers does not see [name]. Daughter is 27 yo, Stage manager, lives with pt.        [name] [phone]), lives with pt's parents.      Needs assistance with ADL's and IADL's--has personal care services 20 hrs/wk;       No tobacco, EtOH, drugs.      **Patient very concerned about her kids being taken from her.      **Case Manager: Jack Quarto [phone]      Patient [facility]    Allergies Allergies  Allergen Reactions  . Morphine Dermatitis and Other (See Comments)    Skin turned red    Physical Exam BP 130/84 mmHg  Pulse 86  Ht 4\' 11"  (1.499 m)  Wt 133 lb (60.328 kg)  BMI 26.85 kg/m2  LMP 04/05/2011  General: alert, chronically ill, listing to the right side in her wheelchair limited movement, well nourished, in no acute distress, brown hair, brown eyes, right handed Head: normocephalic, no dysmorphic features Ears, Nose and Throat: Otoscopic:  tympanic membranes normal; pharynx: oropharynx is pink without exudates or tonsillar hypertrophy Neck: supple, full range of motion, no cranial or cervical bruits Respiratory: auscultation clear Cardiovascular: no murmurs, pulses are normal Musculoskeletal: no skeletal deformities or apparent scoliosis; Contractures at both wrists, unable to extend her right wrist in neutral, able to extend her left wrist to neutral; Flexion contractures at her hips, markedly increased hip adductor tone Skin: no rashes or neurocutaneous lesions She has a suprapubic catheter to drain her bladder  Neurologic Exam  Mental Status: alert; oriented to person, place and year; knowledge is normal for age; language is normal; It is difficult to understand her because of severe oral motor apraxia. Cranial Nerves: visual fields are full to double simultaneous stimuli; extraocular movements are full and conjugate; pupils are around reactive to light; funduscopic examination shows sharp disc margins with normal vessels; symmetric facial strength; midline tongue and uvula, However she is only able to slightly protrude her tongue to midline and to the right; air conduction is greater than bone conduction bilaterally Motor: Spastic dystonic quadriparesis; Right arm is flexed at the elbow and extended behind her body with her right hand in a clawhand this. She is able to extend her fingers and grip with moderate weakness she has difficulty moving individual fingers. The left arm sits in her lap. She is able to extend it from her body but is not able to fully extend her fingers. Her fingers are tightly flexed but can be extended. She has purposeful movement of them. She is not able to elevate either of her arms to her shoulders; she can extend her legs at the knees but does not get to a fully extended position. She is not able to move her feet. She can minimally abduct her legs at the hips. With repeated range of motion at her  knees hips and ankles, her spasticity declines considerably, and with that, her discomfort She has spasticity in her 4 limbs, Ashworth 2 in her legs, Ashworth 3 in her arms with clawhand deformities in both hands right greater than left. She is severely dysarthric and at times is difficult to understand her. She has significant kyphosis of the neck and is unable to keep her head upright. I did not see any involuntary movements during the visit today. Sensory: intact responses to cold, vibration; She has problems with stereognosis because she can't manipulate her fingers Coordination: Unable to test Gait and Station: Wheelchair-bound Reflexes: symmetric and diminished bilaterally because of cocontraction; no clonus  Impression 1. Athetoid cerebral palsy 2. Depression and anxiety 3. Insomnia 4. History of asthma 5. 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 Migraine headaches 7. History of deep vein thrombosus with pulmonary embolus 8. Chronic kyphoscoliosis 9. Recurrent urinary tract infections   Recommendations for plan of care The patient's previous [facility] records were reviewed. Kresta has neither had nor required imaging or lab studies since the last visit other than lab studies by a different provider to evaluate for urinary tract infection. 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 She has episodes of transient alteration of awareness the appear to be nonepileptic events.[name] Hong complains today  of involuntary jerking movements of her extremities and trunk since the Clonazepam dose was decreased about a month ago from  four times per day to 0.5mg  twice per day. It is not clear to me if she is experiencing myoclonus, increase in generalized spasticity, or anxiety. [name] Hong is unaware of why the dose was  decreased. She is unaccompanied and there are no records or communications provided by the long term care facility at which she resides. I did not see any involuntary movements during the visit today and [name] Hong denied that they were occurring this morning. A review of the chart does not indicate why the Clonazepam dose was decreased. I attempted to call the long term care facility and Dr Thomasene Lot office to get more information about her concern but was unable to speak to a person who had answers to the question. I explained to [name] Hong that since I do not know why the dose was decreased, that I am reluctant to increase it without more information. I gave her a letter to take to the long term care facility to request that I receive a call from her nurse or treating provider. She is on polypharmacy for her many health problems, and I would be inclined to keep the Clonazepam at the current dose rather than increase it, but need more information before a decision can be made. [name] Hong will otherwise return in late June for pump refill by Dr Sharene Skeans. She agreed with the plans made today.  The medication list was reviewed and reconciled.  No changes were made in the prescribed medications today.  A complete medication list was provided to the patient.     Medication List       This list is accurate as of: 02/22/16 10:07 AM.  Always use your most recent med list.               acetaminophen 325 MG tablet  Commonly known as:  TYLENOL  Take 650 mg by mouth every 6 (six) hours as needed for moderate pain.     baclofen 20 MG tablet  Commonly known as:  LIORESAL  Take 20 mg by mouth 4 (four) times daily. For bladder spasms     bisacodyl 10 MG suppository  Commonly known as:  DULCOLAX  Place 10 mg rectally daily as needed for moderate constipation.     clonazePAM 0.5 MG tablet  Commonly known as:  KLONOPIN  Take 0.5 mg by mouth 2 (two) times daily.     docusate sodium 100 MG capsule  Commonly known as:   COLACE  Take 1 capsule (100 mg total) by mouth daily as needed for mild constipation.     feeding supplement (ENSURE ENLIVE) Liqd  Take 237 mLs by mouth 2 (two) times daily between meals.     flavoxATE 100 MG tablet  Commonly known as:  URISPAS  Take 100 mg by mouth 3 (three) times daily as needed for bladder spasms.     FLUoxetine 20 MG tablet  Commonly known as:  PROZAC  Take 20 mg by mouth daily.     ibuprofen 200 MG tablet  Commonly known as:  ADVIL,MOTRIN  Take 400 mg by mouth every 8 (eight) hours as needed for moderate pain.     lamoTRIgine 100 MG tablet  Commonly known as:  LAMICTAL  Take 100 mg by mouth at bedtime.     magnesium hydroxide 400 MG/5ML suspension  Commonly known as:  MILK OF MAGNESIA  Take 30 mLs by  mouth daily as needed for mild constipation.     multivitamin tablet  Take 1 tablet by mouth daily.     ondansetron 8 MG disintegrating tablet  Commonly known as:  ZOFRAN-ODT  Take 1 tablet (8 mg total) by mouth every 8 (eight) hours as needed for nausea or vomiting.     oxybutynin 5 MG tablet  Commonly known as:  DITROPAN  Take 5 mg by mouth.     oxyCODONE 5 MG immediate release tablet  Commonly known as:  Oxy IR/ROXICODONE  Take one tablet by mouth every 4 hours as needed for pain     promethazine 12.5 MG tablet  Commonly known as:  PHENERGAN  Take 12.5 mg by mouth every 6 (six) hours as needed for nausea or vomiting.     QUEtiapine 100 MG tablet  Commonly known as:  SEROQUEL  Take 1 tablet (100 mg total) by mouth at bedtime.     tiZANidine 4 MG capsule  Commonly known as:  ZANAFLEX  Take 4 mg by mouth 2 (two) times daily at 8 am and 10 pm.        Dr. Sharene Skeans was consulted regarding the patient.   Total time spent with the patient was 30 minutes, of which 50% or more was spent in counseling and coordination of care.   Elveria Rising

## 2016-02-22 NOTE — Patient Instructions (Signed)
I am unable to make changes in the Clonazepam dose without more information about why it was decreased. I have written a letter to your provider at the long term facility and have asked them to call me so that we can discuss this problem.   Please plan to return as previously planned in late June for your Baclofen pump refill with Dr Sharene SkeansHickling.

## 2016-02-23 ENCOUNTER — Emergency Department (HOSPITAL_COMMUNITY)
Admission: EM | Admit: 2016-02-23 | Discharge: 2016-02-23 | Disposition: A | Discharge status: Home / Self Care | Payer: Medicare Other | Attending: Emergency Medicine | Admitting: Emergency Medicine

## 2016-02-23 ENCOUNTER — Encounter (HOSPITAL_COMMUNITY): Payer: Self-pay | Admitting: Emergency Medicine

## 2016-02-23 DIAGNOSIS — Z8709 Personal history of other diseases of the respiratory system: Secondary | ICD-10-CM | POA: Diagnosis not present

## 2016-02-23 DIAGNOSIS — G43909 Migraine, unspecified, not intractable, without status migrainosus: Secondary | ICD-10-CM | POA: Insufficient documentation

## 2016-02-23 DIAGNOSIS — Z8742 Personal history of other diseases of the female genital tract: Secondary | ICD-10-CM | POA: Insufficient documentation

## 2016-02-23 DIAGNOSIS — Z862 Personal history of diseases of the blood and blood-forming organs and certain disorders involving the immune mechanism: Secondary | ICD-10-CM | POA: Diagnosis not present

## 2016-02-23 DIAGNOSIS — Z872 Personal history of diseases of the skin and subcutaneous tissue: Secondary | ICD-10-CM | POA: Insufficient documentation

## 2016-02-23 DIAGNOSIS — N3289 Other specified disorders of bladder: Secondary | ICD-10-CM | POA: Diagnosis not present

## 2016-02-23 DIAGNOSIS — T83091A Other mechanical complication of indwelling urethral catheter, initial encounter: Secondary | ICD-10-CM | POA: Diagnosis not present

## 2016-02-23 DIAGNOSIS — Z8669 Personal history of other diseases of the nervous system and sense organs: Secondary | ICD-10-CM | POA: Insufficient documentation

## 2016-02-23 DIAGNOSIS — I10 Essential (primary) hypertension: Secondary | ICD-10-CM | POA: Diagnosis not present

## 2016-02-23 DIAGNOSIS — Z8619 Personal history of other infectious and parasitic diseases: Secondary | ICD-10-CM | POA: Insufficient documentation

## 2016-02-23 DIAGNOSIS — Z931 Gastrostomy status: Secondary | ICD-10-CM | POA: Diagnosis not present

## 2016-02-23 DIAGNOSIS — F329 Major depressive disorder, single episode, unspecified: Secondary | ICD-10-CM | POA: Insufficient documentation

## 2016-02-23 DIAGNOSIS — Z87440 Personal history of urinary (tract) infections: Secondary | ICD-10-CM | POA: Diagnosis not present

## 2016-02-23 DIAGNOSIS — Z8701 Personal history of pneumonia (recurrent): Secondary | ICD-10-CM | POA: Insufficient documentation

## 2016-02-23 DIAGNOSIS — T83498A Other mechanical complication of other prosthetic devices, implants and grafts of genital tract, initial encounter: Secondary | ICD-10-CM | POA: Diagnosis not present

## 2016-02-23 DIAGNOSIS — T83198A Other mechanical complication of other urinary devices and implants, initial encounter: Secondary | ICD-10-CM | POA: Diagnosis not present

## 2016-02-23 DIAGNOSIS — Z86711 Personal history of pulmonary embolism: Secondary | ICD-10-CM | POA: Insufficient documentation

## 2016-02-23 DIAGNOSIS — Y658 Other specified misadventures during surgical and medical care: Secondary | ICD-10-CM | POA: Insufficient documentation

## 2016-02-23 DIAGNOSIS — F429 Obsessive-compulsive disorder, unspecified: Secondary | ICD-10-CM | POA: Diagnosis not present

## 2016-02-23 DIAGNOSIS — M1990 Unspecified osteoarthritis, unspecified site: Secondary | ICD-10-CM | POA: Insufficient documentation

## 2016-02-23 DIAGNOSIS — T83031A Leakage of indwelling urethral catheter, initial encounter: Secondary | ICD-10-CM | POA: Diagnosis present

## 2016-02-23 DIAGNOSIS — Z79899 Other long term (current) drug therapy: Secondary | ICD-10-CM | POA: Diagnosis not present

## 2016-02-23 DIAGNOSIS — Z8719 Personal history of other diseases of the digestive system: Secondary | ICD-10-CM | POA: Insufficient documentation

## 2016-02-23 LAB — CBC WITH DIFFERENTIAL/PLATELET
BASOS PCT: 0 %
Basophils Absolute: 0 10*3/uL (ref 0.0–0.1)
Eosinophils Absolute: 0.1 10*3/uL (ref 0.0–0.7)
Eosinophils Relative: 2 %
HEMATOCRIT: 41.5 % (ref 36.0–46.0)
Hemoglobin: 13.8 g/dL (ref 12.0–15.0)
LYMPHS PCT: 29 %
Lymphs Abs: 1.8 10*3/uL (ref 0.7–4.0)
MCH: 28.9 pg (ref 26.0–34.0)
MCHC: 33.3 g/dL (ref 30.0–36.0)
MCV: 87 fL (ref 78.0–100.0)
MONO ABS: 0.8 10*3/uL (ref 0.1–1.0)
MONOS PCT: 12 %
NEUTROS ABS: 3.4 10*3/uL (ref 1.7–7.7)
Neutrophils Relative %: 57 %
Platelets: 291 10*3/uL (ref 150–400)
RBC: 4.77 MIL/uL (ref 3.87–5.11)
RDW: 14.6 % (ref 11.5–15.5)
WBC: 6 10*3/uL (ref 4.0–10.5)

## 2016-02-23 LAB — BASIC METABOLIC PANEL
ANION GAP: 12 (ref 5–15)
BUN: 6 mg/dL (ref 6–20)
CALCIUM: 9.1 mg/dL (ref 8.9–10.3)
CO2: 27 mmol/L (ref 22–32)
Chloride: 102 mmol/L (ref 101–111)
Creatinine, Ser: 0.43 mg/dL — ABNORMAL LOW (ref 0.44–1.00)
GFR calc Af Amer: >60 mL/min (ref >60)
GFR calc non Af Amer: >60 mL/min (ref >60)
GLUCOSE: 88 mg/dL (ref 65–99)
Potassium: 4.1 mmol/L (ref 3.5–5.1)
Sodium: 141 mmol/L (ref 135–145)

## 2016-02-23 LAB — I-STAT CG4 LACTIC ACID, ED: Lactic Acid, Venous: 0.54 mmol/L (ref 0.5–2.0)

## 2016-02-23 MED ORDER — SODIUM CHLORIDE 0.9 % IV SOLN
INTRAVENOUS | Status: DC
  Administered 2016-02-23: 18:00:00 via INTRAVENOUS

## 2016-02-23 NOTE — ED Notes (Addendum)
Upon assessment of pt suprapubic catheter visualized; split guaze around site saturated in urine; actual site noted to have pool of urine, discharge and foul odor.

## 2016-02-23 NOTE — ED Notes (Signed)
[facility] notified for transport 

## 2016-02-23 NOTE — ED Notes (Signed)
Per EMS pt complaint of foley catheter leakage for a week.

## 2016-02-23 NOTE — ED Notes (Signed)
Bed: [id number] Expected date:  Expected time:  Means of arrival:  Comments: EMS- 40yo F, leaking catheter/ Hx of CP

## 2016-02-23 NOTE — ED Provider Notes (Signed)
CSN: [id number]     Arrival date & time 02/23/16  1603 History   First MD Initiated Contact with Patient 02/23/16 1735     Chief Complaint  Patient presents with  . Foley Problem      (Consider location/radiation/quality/duration/timing/severity/associated sxs/prior Treatment) HPI  [name] [name] is a 40 y.o. female who presents for evaluation of leakage around urinary catheter, suprapubic, and decreased urinary output in her catheter bag. She states that this started since the catheter was replaced in her suprapubic ostomy, 3 days ago. At that time the urologist noted that she was having urinary output through her urethra, despite recently having a surgical procedure to oversew the bladder neck. Because of that, he started her on Ditropan " around the clock". She denies fever, nausea or vomiting. She lives in a nursing care facility. Her [facility]rologic care is at [facility], where she was seen 3 days ago. The patient is requesting that I put a larger catheter in her suprapubic ostomy. There are no other known modifying factors.    Past Medical History  Diagnosis Date  . Cerebral palsy ([name])   . Pulmonary embolism ([name])     Lifetime Coumadin  . Cerebral palsy ([name])     Spastic Cerebral palsy, mentally intact  . Contracture, joint, multiple sites     Electric wheelchair, uses left hand to operate chair.   [name] Kitchen Hypertension   . OCD (obsessive compulsive disorder)   . Depression   . Migraine   . [name]   . Dysarthria   . Endometriosis   . History of recurrent UTIs   . GERD (gastroesophageal reflux disease)   . Pulmonary embolism ([name]) 2011, 01/2011    Will be on lifetime coumadin  . Anemia   . Abdominal pain 01/02/2012    Overview:  Overview:  Per Previous pcp note- Dr. Ta--Pt has history of endometriosis and had DUB in 2011.  She She had u/s that showed normal endometrial stripe.  She had endometrial bx that was wnl.  She has a lot of abd cramping  every month.  This cramping was sometimes not relieved with hydrocodone.  She was referred to [facility] for IUD placement to help endometriosis and abd cramping.  [name]  . Macroscopic hematuria 03/10/2013    status post replacement of suprapubic tube 03/04/2013   . HYPERTENSION, BENIGN 01/31/2010    Qualifier: Diagnosis of  By: [name], MD, [name] History of endometriosis 10/2012    OBGYN [facility]: Laparoscopic Endometrial Ablation, TVH,  . Gram positive sepsis ([name]) 10/24/2014  . HCAP (healthcare-associated pneumonia) 10/25/2014  . Abdominal pain 01/02/2012    Overview:  Overview:  Per Previous pcp note- Dr. Ta--Pt has history of endometriosis and had DUB in 2011.  She She had u/s that showed normal endometrial stripe.  She had endometrial bx that was wnl.  She has a lot of abd cramping every month.  This cramping was sometimes not relieved with hydrocodone.  She was referred to [facility] for IUD placement to help endometriosis and abd cramping.  [name] was placed 04/30/11 by [facility].  Pt still complains of abd cramping, which I am treating with hydrocodone and ultram. Pt reports some initial relief of pain from IUD, but states it has now returned back to similar to previous cramping. Gave patient a shot of Toradol 60 mg IM today-since positive exacerbation of abdominal cramping during past 2 weeks. Physical exam reassuring. Patient may need to  return to [facility] for further workup since she is plugged in with the GYN providers there for further treatment options regarding endometriosis and abdominal cramping. The dysfunctional uterine bleeding now well contr  . Acute respiratory failure with hypoxia ([name])   . Cellulitis 12/22/2014  . Disease of female genital organs 10/24/2010    Overview:  Overview:  Pt has history of endometriosis and had DUB in 2011.  She had u/s that showed normal endometrial stripe.  She had endometrial bx that was wnl.  She has a lot of abd cramping every month.  This  cramping was sometimes not relieved with hydrocodone.  She was referred to [facility] for IUD placement to help endometriosis and abd cramping.  [name] was placed 04/30/11 by [facility].  Pt still complains of abd cramping, which was treated with hydrocodone and ultram.   Last seen at [facility] by OB/GYN 12/24/11, given Depo Lupron injection.  Ordering CT scan to r/o additional cause of abdominal pain (pt with known endometriosis and will not tolerate vaginal U/S, pain not improved s/p [name] or with Lupron injection-- only caused hot flashes and amenorrhea, no pain relief).  Also made referral to pain clinic for better pain control.  Do not think hysterectomy would help with pain, and concern for contamination of baclofen pump if did proceed with surgery.    8/5/13[name] OB/[facility]N: Pt was seen by   . Dyspnea and respiratory abnormality 02/04/2011    Overview:  Overview:  Pt has history of recurrent PE and is on chronic coumadin.  Pt has been complaining of dyspnea that she describes as chest tightness.  She has been to the ER multiple times for this.  Usually she gets CTA and serial CE.  She has had at least 6 CTA in a period of since 2011.  She also has an O2 requirement. It is unclear why pt feels dyspneic, complains of chest tightness, or has O2 requirement.  I thought that her complaint may be from deconditioning and referred her to PT.  Unfortunately Medicaid only pays for 4 PT visits.  Pt should be doing these exercises at home, but she has not.  I have referred her to [facility], and she has been seen by [name].  He did not understand the etiology of her dyspnea and O2 requirement.  He will continue to follow her.   Last Assessment & Plan:  Pt presents with concerns for chest tightness and dyspnea.  She was seen in ED yesterday and discharged home.  While there she had a CT chest 04/20/11 showed 1.  Multifocal degradation as detailed above.  N  . Gross hematuria 03/12/2013    Overview:  Overview:  status post  replacement of suprapubic tube 03/04/2013  Last Assessment & Plan:  Recent urine culture collected in ED on 5/4 did not result in significant growth. Moreover, patient has been asymptomatic with normal WBC, no fever and no true symptoms making symptomatic UTI less likely.  Will continue to monitor for any sign of infection.  Patient's xarelto had been held during her recent hospitalization due to hematuria. This has since been re-started. Monitor bleeding.    [name] Kitchen History of anticoagulant therapy 10/21/2012  . Infantile cerebral palsy ([name]) 01/29/2007    Overview:  She has spastic CP.  She is mentally intact.  She is wheelchair bound.  She is wheelchair bound for ambulation      . Intracervical pessary 05/07/2011    Overview:  Overview:  Placed by [facility] GYN 04/30/11 to treat DUB and Endometriosis.  She is seen at GYN clinic at [facility] but was considered a poor surgical candidate and referred her to [facility].  For DUB she had pelvic ultrasound that showed thin stripe and she had endometrial Bx by Dr Jennette Kettle in GYN clinic, which was negative.     . Parapneumonic effusion 10/25/2014  . Presence of intrathecal baclofen pump 03/07/2014    R LQ. Insertion T10.    [name] Kitchen Psychiatric illness 10/31/2014  . Recurrent pulmonary embolism ([name]) 05/03/2011    Pt has history of recurrent PE and is on chronic coumadin.  Pt has been complaining of dyspnea that she describes as chest tightness.  She has been to the ER multiple times for this.  Usually she gets CTA and serial CE.  She has had at least 6 CTA in a period of since 2011.  She also has an O2 requirement. It is unclear why pt feels dyspneic, complains of chest tightness, or has O2 requirement.  I thought that her complaint may be from deconditioning and referred her to PT.  Unfortunately Medicaid only pays for 4 PT visits.  Pt should be doing these exercises at home, but she has not.  I have referred her to [facility], and she has been seen by [name].  He did not understand the  etiology of her dyspnea and O2 requirement.  He will continue to follow her.     . Seborrheic keratosis 01/18/2011  . Spasticity 01/05/2014  . Transient alteration of awareness, recurrent 09/08/2006    Recurrent episodes where Shirel  loses contact with her world and that have been studied thoroughly and appear nonepileptic in nature per Dr Terrace Arabia (neurology) The patient has had episodes starting in 2007 that initially began 1-4 times a day during which time she would have periods of unawareness followed by confusion. This continuous video EEG monitoring performed from October 8-12, 2007. Had   . Acute cystitis without hematuria   . Acute cystitis with hematuria   . Urethra dilated and patulous 07/03/2015    Patulous, Dilated Urethra. 5mL balloon on a 22-Fr catheter hoping this would prevent the bladder spasm from pushing the balloon down the urethra (Dr Logan Bores, Urology [facility], July 2016)   . Seizures ([name]) 02/12/2016   Past Surgical History  Procedure Laterality Date  . Colposcopy  06/2000  . Cesarean section      x 2  . Tubal ligation  2003  . Carpal tunnel release  08/2008    Dr Teressa Senter  . Wrist surgery  06/2010    Dr Dierdre Searles, hand surgeon, [name]  . Baclofen pump refill      x 3 times  . Appendectomy    . Intrauterine device insertion  04/30/11    Inserted by [facility] GYN for endometriosis  . Laparoscopically assisted vag hysterectomy  10/27/2012  . Multiple extractions with alveoloplasty Bilateral 07/19/2013    Procedure: EXTRACTIONS #4, 1,61,09,60;  Surgeon: Francene Finders, DDS;  Location: [facility] OR;  Service: Oral Surgery;  Laterality: Bilateral;  . Pain pump implantation N/A 03/07/2014    Procedure: baclofen pump revision/replacement and Catheter connection replacement;  Surgeon: Cristi Loron, MD;  Location: [facility] NEURO ORS;  Service: Neurosurgery;  Laterality: N/A;  baclofen pump revision/replacement and Catheter connection replacement  . Programable baclofen pump revision  03/17/14     Battery Replacement   . Cholecystectomy  10/14/2015    Procedure: LAPAROSCOPIC CHOLECYSTECTOMY;  Surgeon: Violeta Gelinas, MD;  Location: [facility] OR;  Service: General;;  . Abdominal hysterectomy    . Neuroma surgery Left     anterior and posterior intersosseous  . Urethra surgery     Family History  Problem Relation Age of Onset  . Asthma Father   . Colon cancer Maternal Grandmother     Died in her 96's  . Cancer Paternal Grandmother   . Breast cancer Paternal Grandmother     Died in her 71's  . Heart attack Maternal Grandfather     Died in his 59's  . Alzheimer's disease Paternal Grandfather     Died in his 32's   Social History  Substance Use Topics  . Smoking status: Never Smoker   . Smokeless tobacco: Never Used  . Alcohol Use: 0.6 oz/week    1 Glasses of wine per week     Comment: Drinks alcohol once a month.   OB History    No data available     Review of Systems  All other systems reviewed and are negative.     Allergies  Morphine  Home Medications   Prior to Admission medications   Medication Sig Start Date End Date Taking? Authorizing Provider  acetaminophen (TYLENOL) 325 MG tablet Take 650 mg by mouth every 6 (six) hours as needed for moderate pain.     Historical Provider, MD  baclofen (LIORESAL) 20 MG tablet Take 20 mg by mouth 4 (four) times daily. For bladder spasms    Historical Provider, MD  bisacodyl (DULCOLAX) 10 MG suppository Place 10 mg rectally daily as needed for moderate constipation.    Historical Provider, MD  clonazePAM (KLONOPIN) 0.5 MG tablet Take 0.5 mg by mouth 2 (two) times daily.    Historical Provider, MD  docusate sodium (COLACE) 100 MG capsule Take 1 capsule (100 mg total) by mouth daily as needed for mild constipation. 01/08/16   Pincus Large, DO  feeding supplement, ENSURE ENLIVE, (ENSURE ENLIVE) LIQD Take 237 mLs by mouth 2 (two) times daily between meals. 10/30/15   Palma Holter, MD  flavoxATE (URISPAS) 100 MG tablet Take  100 mg by mouth 3 (three) times daily as needed for bladder spasms.    Historical Provider, MD  FLUoxetine (PROZAC) 20 MG tablet Take 20 mg by mouth daily.    Historical Provider, MD  ibuprofen (ADVIL,MOTRIN) 200 MG tablet Take 400 mg by mouth every 8 (eight) hours as needed for moderate pain.     Historical Provider, MD  lamoTRIgine (LAMICTAL) 100 MG tablet Take 100 mg by mouth at bedtime.    Historical Provider, MD  magnesium hydroxide (MILK OF MAGNESIA) 400 MG/5ML suspension Take 30 mLs by mouth daily as needed for mild constipation.    Historical Provider, MD  Multiple Vitamin (MULTIVITAMIN) tablet Take 1 tablet by mouth daily.    Historical Provider, MD  ondansetron (ZOFRAN-ODT) 8 MG disintegrating tablet Take 1 tablet (8 mg total) by mouth every 8 (eight) hours as needed for nausea or vomiting. 11/07/15   Nani Ravens, MD  oxybutynin (DITROPAN) 5 MG tablet Take 5 mg by mouth. 02/20/16   Historical Provider, MD  oxyCODONE (OXY IR/ROXICODONE) 5 MG immediate release tablet Take one tablet by mouth every 4 hours as needed for pain 02/12/16   Sharee Holster, NP  promethazine (PHENERGAN) 12.5 MG tablet Take 12.5 mg by mouth every 6 (six) hours as needed for nausea or vomiting.    Historical Provider, MD  QUEtiapine (SEROQUEL) 100 MG tablet Take 1  tablet (100 mg total) by mouth at bedtime. 06/21/15   Tyrone Nineyan B Grunz, MD  tiZANidine (ZANAFLEX) 4 MG capsule Take 4 mg by mouth 2 (two) times daily at 8 am and 10 pm.    Historical Provider, MD   BP 139/89 mmHg  Pulse 95  Temp(Src) 98.1 F (36.7 C) (Oral)  Resp 18  SpO2 93%  LMP 04/05/2011 Physical Exam  Constitutional: She is oriented to person, place, and time. She appears well-developed.  Appears older than stated age, and chronically ill.  HENT:  Head: Normocephalic and atraumatic.  Right Ear: External ear normal.  Left Ear: External ear normal.  Eyes: Conjunctivae and EOM are normal. Pupils are equal, round, and reactive to light.  Neck: Normal  range of motion and phonation normal. Neck supple.  Cardiovascular: Normal rate, regular rhythm and normal heart sounds.   Pulmonary/Chest: Effort normal and breath sounds normal. No respiratory distress. She exhibits no bony tenderness.  Abdominal: Soft. She exhibits no distension. There is no tenderness.  Suprapubic ostomy site mid lower abdomen, has a small amount of whitish drainage, which cleared with 1 swipe of the cause. The ostomy is somewhat enlarged, but there is no urine draining out of it. The Foley catheter within the ostomy appears in good repair. There is a small amount of bloody urine, in the catheter tubing.  Musculoskeletal: Normal range of motion.  Neurological: She is alert and oriented to person, place, and time. No cranial nerve deficit or sensory deficit.  Spastic quadriparesis.  Skin: Skin is warm, dry and intact.  Psychiatric: She has a normal mood and affect. Her behavior is normal. Judgment and thought content normal.  Nursing note and vitals reviewed.   ED Course  Procedures (including critical care time)  Medications  0.9 %  sodium chloride infusion ( Intravenous New Bag/Given 02/23/16 1829)    Patient Vitals for the past 24 hrs:  BP Temp Temp src Pulse Resp SpO2  02/23/16 1905 139/89 mmHg - - 95 18 93 %  02/23/16 1620 129/96 mmHg 98.1 F (36.7 C) Oral 96 18 95 %   20:10- bladder scan requested- 66 mL indicating no bladder outlet obstruction.  10:18 PM Reevaluation with update and discussion. After initial assessment and treatment, an updated evaluation reveals no change in clinical status. Findings discussed with patient and all questions were answered. Monae Topping L    Labs Review Labs Reviewed  BASIC METABOLIC PANEL - Abnormal; Notable for the following:    Creatinine, Ser 0.43 (*)    All other components within normal limits  URINE CULTURE  CBC WITH DIFFERENTIAL/PLATELET  URINALYSIS, ROUTINE W REFLEX MICROSCOPIC (NOT AT [facility])  I-STAT CG4 LACTIC  ACID, ED    Imaging Review No results found. I have personally reviewed and evaluated these images and lab results as part of my medical decision-making.   EKG Interpretation None      MDM   Final diagnoses:  Bladder spasm    Chronic bladder disorder, including urine leak from surgically closed bladder neck, as well as chronic suprapubic catheterization. No evidence for seizures bacterial infection. Metabolic instability or impending vascular collapse.  Nursing Notes Reviewed/ Care Coordinated Applicable Imaging Reviewed Interpretation of Laboratory Data incorporated into ED treatment  The patient appears reasonably screened and/or stabilized for discharge and I doubt any other medical condition or other [facility] requiring further screening, evaluation, or treatment in the ED at this time prior to discharge.  Plan: Home Medications- usual; Home Treatments- rest; return here if  the recommended treatment, does not improve the symptoms; Recommended follow up- PCP prn. Urology follow-up for further care of suprapubic catheter, and urinary leakage   Mancel Bale, MD 02/23/16 2220

## 2016-02-25 DIAGNOSIS — R000 Tachycardia, unspecified: Secondary | ICD-10-CM | POA: Diagnosis not present

## 2016-02-25 DIAGNOSIS — G809 Cerebral palsy, unspecified: Secondary | ICD-10-CM | POA: Diagnosis not present

## 2016-02-25 DIAGNOSIS — T8329XA Other mechanical complication of graft of urinary organ, initial encounter: Secondary | ICD-10-CM | POA: Diagnosis not present

## 2016-02-25 DIAGNOSIS — Z79899 Other long term (current) drug therapy: Secondary | ICD-10-CM | POA: Diagnosis not present

## 2016-02-25 DIAGNOSIS — Z90710 Acquired absence of both cervix and uterus: Secondary | ICD-10-CM | POA: Diagnosis not present

## 2016-02-25 DIAGNOSIS — Z9889 Other specified postprocedural states: Secondary | ICD-10-CM | POA: Diagnosis not present

## 2016-02-25 DIAGNOSIS — Z885 Allergy status to narcotic agent status: Secondary | ICD-10-CM | POA: Diagnosis not present

## 2016-02-25 DIAGNOSIS — T83038A Leakage of other indwelling urethral catheter, initial encounter: Secondary | ICD-10-CM | POA: Diagnosis not present

## 2016-02-25 DIAGNOSIS — Z9049 Acquired absence of other specified parts of digestive tract: Secondary | ICD-10-CM | POA: Diagnosis not present

## 2016-02-25 DIAGNOSIS — I10 Essential (primary) hypertension: Secondary | ICD-10-CM | POA: Diagnosis not present

## 2016-02-25 DIAGNOSIS — Z7401 Bed confinement status: Secondary | ICD-10-CM | POA: Diagnosis not present

## 2016-02-25 DIAGNOSIS — T83091A Other mechanical complication of indwelling urethral catheter, initial encounter: Secondary | ICD-10-CM | POA: Diagnosis not present

## 2016-02-28 LAB — URINE CULTURE
Culture: >=100000
SPECIAL REQUESTS: NORMAL

## 2016-02-29 ENCOUNTER — Telehealth: Payer: Self-pay | Admitting: *Deleted

## 2016-02-29 NOTE — ED Notes (Signed)
(+)  urine culture reviewed by Roxy Horsemanobert Browning, MD and results faxed to Dr. Marcelyn Bruinsobert Evans, urology at [phone] per Dr. Dahlia ClientBrowning instruction.  Confirmation of receipt confirmed

## 2016-02-29 NOTE — Progress Notes (Signed)
ED Antimicrobial Stewardship Positive Culture Follow Up   Mindi SlickerJudith Caponi is an 40 y.o. female who [facility] with a chief complaint of  Chief Complaint  Patient presents with  . Foley Problem     Recent Results (from the past 720 hour(s))  Urine culture     Status: None   Collection Time: 02/23/16 10:51 PM  Result Value Ref Range Status   Specimen Description URINE, SUPRAPUBIC  Final   Special Requests Normal  Final   Culture   Final    >=100,000 COLONIES/mL MORGANELLA MORGANII >=100,000 COLONIES/mL KLEBSIELLA OXYTOCA >=100,000 COLONIES/mL ESCHERICHIA COLI Confirmed Extended Spectrum Beta-Lactamase Producer (ESBL) >=100,000 COLONIES/mL ENTEROCOCCUS SPECIES >=100,000 COLONIES/mL PROTEUS MIRABILIS Performed at [facility]     Report Status 02/28/2016 FINAL  Final   Organism ID, Bacteria MORGANELLA MORGANII  Final   Organism ID, Bacteria KLEBSIELLA OXYTOCA  Final   Organism ID, Bacteria ESCHERICHIA COLI  Final   Organism ID, Bacteria ENTEROCOCCUS SPECIES  Final   Organism ID, Bacteria PROTEUS MIRABILIS  Final      Susceptibility   Escherichia coli - MIC*    AMPICILLIN >=32 RESISTANT Resistant     CEFAZOLIN >=64 RESISTANT Resistant     CEFTRIAXONE >=64 RESISTANT Resistant     CIPROFLOXACIN >=4 RESISTANT Resistant     GENTAMICIN <=1 SENSITIVE Sensitive     IMIPENEM <=0.25 SENSITIVE Sensitive     NITROFURANTOIN 64 INTERMEDIATE Intermediate     TRIMETH/SULFA >=320 RESISTANT Resistant     AMPICILLIN/SULBACTAM >=32 RESISTANT Resistant     PIP/TAZO <=4 SENSITIVE Sensitive     * >=100,000 COLONIES/mL ESCHERICHIA COLI   Klebsiella oxytoca - MIC*    AMPICILLIN >=32 RESISTANT Resistant     CEFAZOLIN >=64 RESISTANT Resistant     CEFTRIAXONE <=1 SENSITIVE Sensitive     CIPROFLOXACIN <=0.25 SENSITIVE Sensitive     GENTAMICIN <=1 SENSITIVE Sensitive     IMIPENEM <=0.25 SENSITIVE Sensitive     NITROFURANTOIN <=16 SENSITIVE Sensitive     TRIMETH/SULFA  <=20 SENSITIVE Sensitive     AMPICILLIN/SULBACTAM 16 INTERMEDIATE Intermediate     PIP/TAZO <=4 SENSITIVE Sensitive     * >=100,000 COLONIES/mL KLEBSIELLA OXYTOCA   Morganella morganii - MIC*    AMPICILLIN >=32 RESISTANT Resistant     CEFAZOLIN >=64 RESISTANT Resistant     CEFTRIAXONE 16 INTERMEDIATE Intermediate     CIPROFLOXACIN >=4 RESISTANT Resistant     GENTAMICIN <=1 SENSITIVE Sensitive     IMIPENEM 1 SENSITIVE Sensitive     NITROFURANTOIN 64 RESISTANT Resistant     TRIMETH/SULFA >=320 RESISTANT Resistant     AMPICILLIN/SULBACTAM >=32 RESISTANT Resistant     PIP/TAZO <=4 SENSITIVE Sensitive     * >=100,000 COLONIES/mL MORGANELLA MORGANII   Proteus mirabilis - MIC*    AMPICILLIN <=2 SENSITIVE Sensitive     CEFAZOLIN <=4 SENSITIVE Sensitive     CEFTRIAXONE <=1 SENSITIVE Sensitive     CIPROFLOXACIN <=0.25 SENSITIVE Sensitive     GENTAMICIN <=1 SENSITIVE Sensitive     IMIPENEM 2 SENSITIVE Sensitive     NITROFURANTOIN 128 RESISTANT Resistant     TRIMETH/SULFA <=20 SENSITIVE Sensitive     AMPICILLIN/SULBACTAM <=2 SENSITIVE Sensitive     PIP/TAZO <=4 SENSITIVE Sensitive     * >=100,000 COLONIES/mL PROTEUS MIRABILIS   Enterococcus species - MIC*    AMPICILLIN <=2 SENSITIVE Sensitive     LEVOFLOXACIN >=8 RESISTANT Resistant     NITROFURANTOIN <=16 SENSITIVE Sensitive     VANCOMYCIN 1 SENSITIVE  Sensitive     * >=100,000 COLONIES/mL ENTEROCOCCUS SPECIES     Patient discharged originally without antimicrobial agent and treatment is now indicated  New antibiotic prescription: Will send report to patient's urologist Marcelyn Bruins, MD) so he can decide on treatment plan and have adequate follow-up with the patient. Will also send a copy to her nursing facility [facility]).  ED Provider: Roxy Horseman, PA-C   Adoria Kawamoto A Beatriz Quintela 02/29/2016, 9:55 AM Infectious Diseases Pharmacist Phone# [phone]

## 2016-03-01 ENCOUNTER — Telehealth: Payer: Self-pay | Admitting: *Deleted

## 2016-03-01 DIAGNOSIS — I82591 Chronic embolism and thrombosis of other specified deep vein of right lower extremity: Secondary | ICD-10-CM | POA: Diagnosis not present

## 2016-03-01 DIAGNOSIS — Z86718 Personal history of other venous thrombosis and embolism: Secondary | ICD-10-CM | POA: Diagnosis not present

## 2016-03-01 DIAGNOSIS — I2699 Other pulmonary embolism without acute cor pulmonale: Secondary | ICD-10-CM | POA: Diagnosis not present

## 2016-03-01 DIAGNOSIS — Z95828 Presence of other vascular implants and grafts: Secondary | ICD-10-CM | POA: Diagnosis not present

## 2016-03-01 DIAGNOSIS — G800 Spastic quadriplegic cerebral palsy: Secondary | ICD-10-CM | POA: Diagnosis not present

## 2016-03-01 DIAGNOSIS — Z7901 Long term (current) use of anticoagulants: Secondary | ICD-10-CM | POA: Diagnosis not present

## 2016-03-01 DIAGNOSIS — R938 Abnormal findings on diagnostic imaging of other specified body structures: Secondary | ICD-10-CM | POA: Diagnosis not present

## 2016-03-01 DIAGNOSIS — I10 Essential (primary) hypertension: Secondary | ICD-10-CM | POA: Diagnosis not present

## 2016-03-01 NOTE — ED Notes (Signed)
Post ED Visit - Positive Culture Follow-up: Chart Hand-off to ED Flow Manager  Culture assessed and recommendations reviewed by: []  Isaac BlissMichael Maccia, Pharm.D., BCPS []  Celedonio MiyamotoJeremy Frens, Pharm.D., BCPS-AQ ID []  Georgina PillionElizabeth [name], Pharm.D., BCPS []  Bailey's CrossroadsMinh Pham, VermontPharm.D., BCPS, AAHIVP []  Estella HuskMichelle Turner, Pharm .D., BCPS, AAHIVP []  Tennis Mustassie Stewart, Pharm.D. []  Casilda Carlsaylor Stone, Pharm.D Meagan Decker  Positive urine culture  []  Patient discharged without antimicrobial prescription and treatment is now indicated []  Organism is resistant to prescribed ED discharge antimicrobial [x]  Patient with positive urine cultures  Changes discussed with ED provider: Results faxed to Patient's Urologist as well as [facility].  Spoke with Lazaro ArmsMaria Bethea via phone and advised that patient needs follow up with Urologist for treatement plan.  Verbalized understanding   Lysle PearlRobertson, Harles Evetts Talley 03/01/2016, 11:40 AM

## 2016-03-04 DIAGNOSIS — F331 Major depressive disorder, recurrent, moderate: Secondary | ICD-10-CM | POA: Diagnosis not present

## 2016-03-11 ENCOUNTER — Encounter: Payer: Self-pay | Admitting: Internal Medicine

## 2016-03-11 ENCOUNTER — Non-Acute Institutional Stay (SKILLED_NURSING_FACILITY): Payer: Medicare Other | Admitting: Internal Medicine

## 2016-03-11 DIAGNOSIS — Z7901 Long term (current) use of anticoagulants: Secondary | ICD-10-CM | POA: Diagnosis not present

## 2016-03-11 DIAGNOSIS — R159 Full incontinence of feces: Secondary | ICD-10-CM | POA: Diagnosis not present

## 2016-03-11 HISTORY — DX: Full incontinence of feces: R15.9

## 2016-03-11 MED ORDER — SENNA 8.6 MG PO TABS: ORAL_TABLET | ORAL | Status: DC

## 2016-03-11 MED ORDER — WARFARIN SODIUM 7.5 MG PO TABS: ORAL_TABLET | ORAL | Status: DC

## 2016-03-11 NOTE — Progress Notes (Signed)
Patient ID: [name] [name], female   DOB: 1976-07-22, 40 y.o.   MRN: [id number]  Location:  [facility] Room Number: 413B Place of Service:  SNF (31) Provider:  Kimber Relic, MD  Patient Care Team: Kimber Relic, MD as PCP - General (Internal Medicine) Jamison Neighbor, MD as Consulting Physician (Urology) Deetta Perla, MD as Consulting Physician (Neurology) Levert Feinstein, MD as Consulting Physician (Neurology)  Extended Emergency Contact Information Primary Emergency Contact: Cohrs,[name] Address: [address], [place] Home Phone: [phone] Relation: Father Secondary Emergency Contact: Melanie Crazier [place] Home Phone: [phone] Relation: Friend  Code Status:  full Goals of care: Advanced Directive information Advanced Directives 03/11/2016  Does patient have an advance directive? No  Would patient like information on creating an advanced directive? -     Chief Complaint  Patient presents with  . Acute Visit    HPI:  Pt is a 40 y.o. female seen today for an acute visit for Subtherapeutic INR 1.1 despite raising her warfarin from 5 mg to 6 mg 3 days ago. INR fell from 1.3 down to 1.1 in that period of time. Patient is taking her medications.  Secondary problem was complaint of continued postnasal drip and "congestion". She has been taking Zyrtec daily. She would like to try adding Mucinex. She is not running a fever.  Third problem was episodes of fecal incontinence. This is occurring irregularly. She denies any rectal pain or abdominal discomfort. Stools are not exceptionally loose. She takes MiraLAX on a nearly daily basis and feels that she needs to take something for laxative otherwise she gets "stopped up".   Past Medical History  Diagnosis Date  . Cerebral palsy ([name])   . Pulmonary embolism ([name])     Lifetime Coumadin  . Cerebral palsy ([name])     Spastic Cerebral palsy,  mentally intact  . Contracture, joint, multiple sites     Electric wheelchair, uses left hand to operate chair.   [name] Kitchen Hypertension   . OCD (obsessive compulsive disorder)   . Depression   . Migraine   . [name]   . Dysarthria   . Endometriosis   . History of recurrent UTIs   . GERD (gastroesophageal reflux disease)   . Pulmonary embolism ([name]) 2011, 01/2011    Will be on lifetime coumadin  . Anemia   . Abdominal pain 01/02/2012    Overview:  Overview:  Per Previous pcp note- Dr. Ta--Pt has history of endometriosis and had DUB in 2011.  She She had u/s that showed normal endometrial stripe.  She had endometrial bx that was wnl.  She has a lot of abd cramping every month.  This cramping was sometimes not relieved with hydrocodone.  She was referred to [facility] for IUD placement to help endometriosis and abd cramping.  [name]  . Macroscopic hematuria 03/10/2013    status post replacement of suprapubic tube 03/04/2013   . HYPERTENSION, BENIGN 01/31/2010    Qualifier: Diagnosis of  By: [name], MD, [name] History of endometriosis 10/2012    OBGYN [facility]: Laparoscopic Endometrial Ablation, TVH,  . Gram positive sepsis ([name]) 10/24/2014  . HCAP (healthcare-associated pneumonia) 10/25/2014  . Abdominal pain 01/02/2012    Overview:  Overview:  Per Previous pcp note- Dr. Ta--Pt has history of endometriosis and had DUB in 2011.  She She  had u/s that showed normal endometrial stripe.  She had endometrial bx that was wnl.  She has a lot of abd cramping every month.  This cramping was sometimes not relieved with hydrocodone.  She was referred to [facility] for IUD placement to help endometriosis and abd cramping.  [name] was placed 04/30/11 by [facility].  Pt still complains of abd cramping, which I am treating with hydrocodone and ultram. Pt reports some initial relief of pain from IUD, but states it has now returned back to similar to previous cramping. Gave patient a shot of  Toradol 60 mg IM today-since positive exacerbation of abdominal cramping during past 2 weeks. Physical exam reassuring. Patient may need to return to [facility] further workup since she is plugged in with the GYN providers there for further treatment options regarding endometriosis and abdominal cramping. The dysfunctional uterine bleeding now well contr  . Acute respiratory failure with hypoxia ([name])   . Cellulitis 12/22/2014  . Disease of female genital organs 10/24/2010    Overview:  Overview:  Pt has history of endometriosis and had DUB in 2011.  She had u/s that showed normal endometrial stripe.  She had endometrial bx that was wnl.  She has a lot of abd cramping every month.  This cramping was sometimes not relieved with hydrocodone.  She was referred to [facility] for IUD placement to help endometriosis and abd cramping.  [name] was placed 04/30/11 by [facility].  Pt still complains of abd cramping, which was treated with hydrocodone and ultram.   Last seen at [facility] by OB/GYN 12/24/11, given Depo Lupron injection.  Ordering CT scan to r/o additional cause of abdominal pain (pt with known endometriosis and will not tolerate vaginal U/S, pain not improved s/p [name] or with Lupron injection-- only caused hot flashes and amenorrhea, no pain relief).  Also made referral to pain clinic for better pain control.  Do not think hysterectomy would help with pain, and concern for contamination of baclofen pump if did proceed with surgery.    8/[facility]/13[name]: Pt was seen by   . Dyspnea and respiratory abnormality 02/04/2011    Overview:  Overview:  Pt has history of recurrent PE and is on chronic coumadin.  Pt has been complaining of dyspnea that she describes as chest tightness.  She has been to the ER multiple times for this.  Usually she gets CTA and serial CE.  She has had at least 6 CTA in a period of since 2011.  She also has an O2 requirement. It is unclear why pt feels dyspneic, complains of chest  tightness, or has O2 requirement.  I thought that her complaint may be from deconditioning and referred her to PT.  Unfortunately Medicaid only pays for 4 PT visits.  Pt should be doing these exercises at home, but she has not.  I have referred her to [facility], and she has been seen by [name].  He did not understand the etiology of her dyspnea and O2 requirement.  He will continue to follow her.   Last Assessment & Plan:  Pt presents with concerns for chest tightness and dyspnea.  She was seen in ED yesterday and discharged home.  While there she had a CT chest 04/20/11 showed 1.  Multifocal degradation as detailed above.  N  . Gross hematuria 03/12/2013    Overview:  Overview:  status post replacement of suprapubic tube 03/04/2013  Last Assessment & Plan:  Recent urine culture collected in  ED on 5/4 did not result in significant growth. Moreover, patient has been asymptomatic with normal WBC, no fever and no true symptoms making symptomatic UTI less likely.  Will continue to monitor for any sign of infection.  Patient's xarelto had been held during her recent hospitalization due to hematuria. This has since been re-started. Monitor bleeding.    [name] Kitchen History of anticoagulant therapy 10/21/2012  . Infantile cerebral palsy ([name]) 01/29/2007    Overview:  She has spastic CP.  She is mentally intact.  She is wheelchair bound.  She is wheelchair bound for ambulation      . Intracervical pessary 05/07/2011    Overview:  Overview:  Placed by [facility] 04/30/11 to treat DUB and Endometriosis.  She is seen at GYN clinic at [facility] but was considered a poor surgical candidate and referred her to [facility].  For DUB she had pelvic ultrasound that showed thin stripe and she had endometrial Bx by Dr Jennette Kettle in GYN clinic, which was negative.     . Parapneumonic effusion 10/25/2014  . Presence of intrathecal baclofen pump 03/07/2014    R LQ. Insertion T10.    [name] Kitchen Psychiatric illness 10/31/2014  . Recurrent pulmonary embolism ([name])  05/03/2011    Pt has history of recurrent PE and is on chronic coumadin.  Pt has been complaining of dyspnea that she describes as chest tightness.  She has been to the ER multiple times for this.  Usually she gets CTA and serial CE.  She has had at least 6 CTA in a period of since 2011.  She also has an O2 requirement. It is unclear why pt feels dyspneic, complains of chest tightness, or has O2 requirement.  I thought that her complaint may be from deconditioning and referred her to PT.  Unfortunately Medicaid only pays for 4 PT visits.  Pt should be doing these exercises at home, but she has not.  I have referred her to [facility], and she has been seen by [name].  He did not understand the etiology of her dyspnea and O2 requirement.  He will continue to follow her.     . Seborrheic keratosis 01/18/2011  . Spasticity 01/05/2014  . Transient alteration of awareness, recurrent 09/08/2006    Recurrent episodes where Arrielle  loses contact with her world and that have been studied thoroughly and appear nonepileptic in nature per Dr Terrace Arabia (neurology) The patient has had episodes starting in 2007 that initially began 1-4 times a day during which time she would have periods of unawareness followed by confusion. This continuous video EEG monitoring performed from October 8-12, 2007. Had   . Acute cystitis without hematuria   . Acute cystitis with hematuria   . Urethra dilated and patulous 07/03/2015    Patulous, Dilated Urethra. 5mL balloon on a 22-Fr catheter hoping this would prevent the bladder spasm from pushing the balloon down the urethra (Dr Logan Bores, Urology [facility], July 2016)   . Seizures ([name]) 02/12/2016   Past Surgical History  Procedure Laterality Date  . Colposcopy  06/2000  . Cesarean section      x 2  . Tubal ligation  2003  . Carpal tunnel release  08/2008    Dr Teressa Senter  . Wrist surgery  06/2010    Dr Dierdre Searles, hand surgeon, [name]  . Baclofen pump refill      x 3 times  . Appendectomy    . Intrauterine  device insertion  04/30/11    Inserted by  [facility] GYN for endometriosis  . Laparoscopically assisted vag hysterectomy  10/27/2012  . Multiple extractions with alveoloplasty Bilateral 07/19/2013    Procedure: EXTRACTIONS #4, 8,11,91,47;  Surgeon: Francene Finders, DDS;  Location: [facility] OR;  Service: Oral Surgery;  Laterality: Bilateral;  . Pain pump implantation N/A 03/07/2014    Procedure: baclofen pump revision/replacement and Catheter connection replacement;  Surgeon: Cristi Loron, MD;  Location: [facility] ORS;  Service: Neurosurgery;  Laterality: N/A;  baclofen pump revision/replacement and Catheter connection replacement  . Programable baclofen pump revision  03/17/14    Battery Replacement   . Cholecystectomy  10/14/2015    Procedure: LAPAROSCOPIC CHOLECYSTECTOMY;  Surgeon: Violeta Gelinas, MD;  Location: [facility] OR;  Service: General;;  . Abdominal hysterectomy    . Neuroma surgery Left     anterior and posterior intersosseous  . Urethra surgery      Allergies  Allergen Reactions  . Morphine Dermatitis and Other (See Comments)    Skin turned red      Medication List       This list is accurate as of: 03/11/16  4:53 PM.  Always use your most recent med list.               baclofen 20 MG tablet  Commonly known as:  LIORESAL  Take 20 mg by mouth 4 (four) times daily. For bladder spasms     clonazePAM 0.5 MG tablet  Commonly known as:  KLONOPIN  Take 0.5 mg by mouth. Take one three times daily     docusate sodium 100 MG capsule  Commonly known as:  COLACE  Take 1 capsule (100 mg total) by mouth daily as needed for mild constipation.     feeding supplement (ENSURE ENLIVE) Liqd  Take 237 mLs by mouth 2 (two) times daily between meals.     FLUoxetine 20 MG tablet  Commonly known as:  PROZAC  Take 20 mg by mouth daily.     lamoTRIgine 100 MG tablet  Commonly known as:  LAMICTAL  Take 100 mg by mouth at bedtime.     Melatonin 5 MG Tabs  Take by mouth. Take one  tablet at bedtime     multivitamin tablet  Take 1 tablet by mouth daily.     ondansetron 8 MG disintegrating tablet  Commonly known as:  ZOFRAN-ODT  Take 1 tablet (8 mg total) by mouth every 8 (eight) hours as needed for nausea or vomiting.     oxybutynin 5 MG tablet  Commonly known as:  DITROPAN  Take 5 mg by mouth.     QUEtiapine 100 MG tablet  Commonly known as:  SEROQUEL  Take 1 tablet (100 mg total) by mouth at bedtime.     tiZANidine 4 MG capsule  Commonly known as:  ZANAFLEX  Take 4 mg by mouth 2 (two) times daily at 8 am and 10 pm.     warfarin 6 MG tablet  Commonly known as:  COUMADIN  Take 6 mg by mouth daily.        Review of Systems  Constitutional: Negative for fever, chills, diaphoresis, activity change, appetite change, fatigue and unexpected weight change.  HENT: Negative for congestion, ear discharge, ear pain, hearing loss, postnasal drip, rhinorrhea, sore throat, tinnitus, trouble swallowing and voice change.   Eyes: Positive for visual disturbance (corrective lenses). Negative for pain, redness and itching.  Respiratory: Positive for shortness of breath. Negative for cough, choking and wheezing.   Cardiovascular: Positive for  leg swelling. Negative for chest pain and palpitations.  Gastrointestinal: Positive for constipation. Negative for nausea, abdominal pain, diarrhea and abdominal distention.       GERD. Episodes of fecal incontinence.  Endocrine: Negative for cold intolerance, heat intolerance, polydipsia, polyphagia and polyuria.  Genitourinary: Negative for dysuria, urgency, frequency, hematuria, flank pain, vaginal discharge, difficulty urinating and pelvic pain.       Suprapubic catheter. History of urethral closure.  Musculoskeletal: Negative for myalgias, back pain, arthralgias, gait problem, neck pain and neck stiffness.       Spastic cerebral palsy with multiple joint contractures  Skin: Negative for color change, pallor and rash.    Allergic/Immunologic: Negative.   Neurological: Negative for dizziness, tremors, seizures, syncope, weakness, numbness and headaches.       Cerebral palsy from birth. Spastic and athetoid movements present.  Hematological: Negative for adenopathy. Does not bruise/bleed easily.       History of anemia  Psychiatric/Behavioral: Positive for sleep disturbance and dysphoric mood. Negative for suicidal ideas, hallucinations, behavioral problems, confusion and agitation. The patient is nervous/anxious. The patient is not hyperactive.        Chronic anxiety    Immunization History  Administered Date(s) Administered  . H1N1 11/04/2008  . Influenza Whole 11/09/2008, 10/05/2009  . Influenza,inj,Quad PF,36+ Mos 10/24/2014  . Influenza-Unspecified 09/13/2015  . Pneumococcal Polysaccharide-23 10/24/2014  . Td 12/02/1994, 03/18/2008   Pertinent  Health Maintenance Due  Topic Date Due  . PAP SMEAR  11/12/2013  . INFLUENZA VACCINE  07/02/2016   Fall Risk  02/06/2016 12/22/2015  Falls in the past year? Exclusion - non ambulatory Exclusion - non ambulatory   Functional Status Survey:    Filed Vitals:   03/11/16 1642  BP: 126/68  Pulse: 70  Temp: 98 F (36.7 C)  Resp: 18  Height: 5' 0.5" (1.537 m)  Weight: 124 lb (56.246 kg)  SpO2: 95%   Body mass index is 23.81 kg/(m^2). Physical Exam  Constitutional: She is oriented to person, place, and time. She appears well-developed and well-nourished. No distress.  HENT:  Head: Normocephalic and atraumatic.  Right Ear: External ear normal.  Left Ear: External ear normal.  Nose: Nose normal.  Mouth/Throat: Oropharynx is clear and moist. No oropharyngeal exudate.  Eyes: Conjunctivae and EOM are normal. Pupils are equal, round, and reactive to light. No scleral icterus.  Neck: Normal range of motion. Neck supple. No JVD present. No tracheal deviation present. No thyromegaly present.  Cardiovascular: Normal rate, regular rhythm, normal heart sounds  and intact distal pulses.  Exam reveals no gallop and no friction rub.   No murmur heard. Pulmonary/Chest: Effort normal and breath sounds normal. No respiratory distress. She has no wheezes. She has no rales. She exhibits no tenderness.  Abdominal: Soft. Bowel sounds are normal. She exhibits no distension and no mass. There is no tenderness.  Suprapubic foley  Genitourinary:  Suprapubic Foley catheter  Musculoskeletal: Normal range of motion. She exhibits edema (trace). She exhibits no tenderness.  Multiple joint contractures  Lymphadenopathy:    She has no cervical adenopathy.  Neurological: She is alert and oriented to person, place, and time. No cranial nerve deficit. Coordination normal.  Spastic cerebral palsy with athetoid movements  Skin: Skin is warm and dry. No rash noted. She is not diaphoretic. No erythema. No pallor.  Psychiatric: She has a normal mood and affect. Her behavior is normal. Judgment and thought content normal.    Labs reviewed:  Recent Labs  10/09/15 0500 10/09/15  1211  10/24/15 0545  10/28/15 0615 10/29/15 0326 11/13/15 02/16/16 02/23/16 1801  NA  --   --   < >  --   < > 137 140 140 141 141  K  --   --   < >  --   < > 4.5 3.7 4.1 4.0 4.1  CL  --   --   < >  --   < > 102 104  --   --  102  CO2  --   --   < >  --   < > 30 29  --   --  27  GLUCOSE  --   --   < >  --   < > 117* 100*  --   --  88  BUN  --   --   < >  --   < > <5* <5* CREATININE  --   --   < >  --   < > 0.42* 0.46 0.5 0.5 0.43*  CALCIUM  --   --   < >  --   < > 8.7* 8.8*  --   --  9.1  MG 1.6* 2.7*  --  2.0  --   --   --   --   --   --   PHOS 2.7  --   --   --   --   --   --   --   --   --   < > = values in this interval not displayed.  Recent Labs  10/18/15 1041 10/19/15 0555 10/20/15 0520 11/13/15 02/16/16  AST 20 11* 14* 15 13  ALT ALKPHOS 200* 184* 162* 133* 90  BILITOT 0.5 0.7 0.6  --   --   PROT 5.5* 5.4* 5.7*  --   --   ALBUMIN 2.2* 2.2* 2.1*  --    --     Recent Labs  10/19/15 0555  10/23/15 0450  10/28/15 0615 10/29/15 0326 11/13/15 02/16/16 02/23/16 1801  WBC 5.9  < >  --   < > 6.2 6.3 8.2 5.3 6.0  NEUTROABS 3.7  --  9.6*  --   --   --   --   --  3.4  HGB 8.5*  < >  --   < > 9.5* 10.5* 9.7* 11.9* 13.8  HCT 27.6*  < >  --   < > 31.6* 34.1* 32* 37 41.5  MCV 92.0  < >  --   < > 92.4 92.4  --   --  87.0  PLT 356  < >  --   < > 389 342 389 221 291  < > = values in this interval not displayed. Lab Results  Component Value Date   TSH 0.710 06/12/2015   Lab Results  Component Value Date   HGBA1C 6.0 07/25/2009   Lab Results  Component Value Date   CHOL 150 01/29/2007   HDL 41 01/29/2007   LDLCALC 92 01/29/2007   TRIG 83 01/29/2007   CHOLHDL 3.7 Ratio 01/29/2007    Assessment/Plan 1. Incontinent of feces Discontinue MiraLAX and Colace Start Senokot-S 1 tablet nightly  2. Chronic anticoagulation Increase warfarin to 7.5 mg daily. PT/INR in 3 days.

## 2016-03-13 NOTE — Addendum Note (Signed)
Addended by: Kimber RelicGREEN, ARTHUR G on: 03/13/2016 05:14 PM   Modules accepted: Level of Service

## 2016-03-15 DIAGNOSIS — N390 Urinary tract infection, site not specified: Secondary | ICD-10-CM | POA: Diagnosis not present

## 2016-03-15 DIAGNOSIS — B9620 Unspecified Escherichia coli [E. coli] as the cause of diseases classified elsewhere: Secondary | ICD-10-CM | POA: Diagnosis not present

## 2016-03-15 LAB — BASIC METABOLIC PANEL
BUN: 9 mg/dL (ref 4–21)
Creatinine: 0.6 mg/dL (ref 0.5–1.1)
GLUCOSE: 90 mg/dL
Potassium: 4 mmol/L (ref 3.4–5.3)
Sodium: 140 mmol/L (ref 137–147)

## 2016-03-21 DIAGNOSIS — N319 Neuromuscular dysfunction of bladder, unspecified: Secondary | ICD-10-CM | POA: Diagnosis not present

## 2016-03-26 ENCOUNTER — Ambulatory Visit: Payer: Medicare Other

## 2016-03-26 ENCOUNTER — Ambulatory Visit: Payer: Medicare Other | Admitting: Physical Medicine & Rehabilitation

## 2016-03-27 DIAGNOSIS — I10 Essential (primary) hypertension: Secondary | ICD-10-CM | POA: Diagnosis not present

## 2016-03-27 DIAGNOSIS — Z7901 Long term (current) use of anticoagulants: Secondary | ICD-10-CM | POA: Diagnosis not present

## 2016-03-27 DIAGNOSIS — G800 Spastic quadriplegic cerebral palsy: Secondary | ICD-10-CM | POA: Diagnosis not present

## 2016-03-27 DIAGNOSIS — Z9981 Dependence on supplemental oxygen: Secondary | ICD-10-CM | POA: Diagnosis not present

## 2016-03-27 DIAGNOSIS — Z86718 Personal history of other venous thrombosis and embolism: Secondary | ICD-10-CM | POA: Diagnosis not present

## 2016-03-27 DIAGNOSIS — Z885 Allergy status to narcotic agent status: Secondary | ICD-10-CM | POA: Diagnosis not present

## 2016-03-27 DIAGNOSIS — Z86711 Personal history of pulmonary embolism: Secondary | ICD-10-CM | POA: Diagnosis not present

## 2016-03-27 DIAGNOSIS — F339 Major depressive disorder, recurrent, unspecified: Secondary | ICD-10-CM | POA: Diagnosis not present

## 2016-03-27 DIAGNOSIS — M1990 Unspecified osteoarthritis, unspecified site: Secondary | ICD-10-CM | POA: Diagnosis not present

## 2016-03-27 DIAGNOSIS — Z79899 Other long term (current) drug therapy: Secondary | ICD-10-CM | POA: Diagnosis not present

## 2016-03-27 DIAGNOSIS — Z95828 Presence of other vascular implants and grafts: Secondary | ICD-10-CM | POA: Diagnosis not present

## 2016-03-27 DIAGNOSIS — M931 Kienbock's disease of adults: Secondary | ICD-10-CM | POA: Diagnosis not present

## 2016-03-27 DIAGNOSIS — F429 Obsessive-compulsive disorder, unspecified: Secondary | ICD-10-CM | POA: Diagnosis not present

## 2016-03-27 DIAGNOSIS — Z452 Encounter for adjustment and management of vascular access device: Secondary | ICD-10-CM | POA: Diagnosis not present

## 2016-03-27 DIAGNOSIS — G808 Other cerebral palsy: Secondary | ICD-10-CM | POA: Diagnosis not present

## 2016-04-01 ENCOUNTER — Encounter: Payer: Self-pay | Admitting: Internal Medicine

## 2016-04-01 ENCOUNTER — Non-Acute Institutional Stay (SKILLED_NURSING_FACILITY): Payer: Medicare Other | Admitting: Internal Medicine

## 2016-04-01 DIAGNOSIS — Z7901 Long term (current) use of anticoagulants: Secondary | ICD-10-CM

## 2016-04-01 DIAGNOSIS — K5900 Constipation, unspecified: Secondary | ICD-10-CM

## 2016-04-01 DIAGNOSIS — R159 Full incontinence of feces: Secondary | ICD-10-CM

## 2016-04-01 DIAGNOSIS — K5909 Other constipation: Secondary | ICD-10-CM

## 2016-04-01 MED ORDER — WARFARIN SODIUM 7.5 MG PO TABS: ORAL_TABLET | ORAL | Status: DC

## 2016-04-01 MED ORDER — SENNA 8.6 MG PO TABS: ORAL_TABLET | ORAL | Status: DC

## 2016-04-01 NOTE — Progress Notes (Signed)
Patient ID: [name] [name], female   DOB: 03/13/1976, 40 y.o.   MRN: [id number]  Location:  [facility] Room Number: 413 Place of Service:  SNF (31) Provider:  Kimber Relic, MD  Patient Care Team: Kimber Relic, MD as PCP - General (Internal Medicine) Jamison Neighbor, MD as Consulting Physician (Urology) Deetta Perla, MD as Consulting Physician (Neurology) Levert Feinstein, MD as Consulting Physician (Neurology) Erick Colace, MD as Consulting Physician (Physical Medicine and Rehabilitation)  Extended Emergency Contact Information Primary Emergency Contact: Tomey,[name] Address: [address], [place] Home Phone: [phone] Relation: Father Secondary Emergency Contact: Melanie Crazier [place] Home Phone: [phone] Relation: Friend  Code Status:  full Goals of care: Advanced Directive information Advanced Directives 04/01/2016  Does patient have an advance directive? No  Would patient like information on creating an advanced directive? -     Chief Complaint  Patient presents with  . Acute Visit    HPI:  Pt is a 40 y.o. female seen today for an acute visit for complaints of constipation. Patient is currently getting Senokot-S 1 tablet nightly. She has understandable orders. She does not want to increase MiraLAX because she becomes incontinent of loose stools when she is on larger doses. She denies any abdominal pain or discomfort.  Chronic anticoagulation. INR results continue to fluctuate. Current dose is 8 mg Coumadin daily and the INR was 1.8. Last week it appeared to be rising and was up to 2.1.   Past Medical History  Diagnosis Date  . Cerebral palsy ([name])   . Pulmonary embolism ([name])     Lifetime Coumadin  . Cerebral palsy ([name])     Spastic Cerebral palsy, mentally intact  . Contracture, joint, multiple sites     Electric wheelchair, uses left hand to operate chair.   [name] Kitchen  Hypertension   . OCD (obsessive compulsive disorder)   . Depression   . Migraine   . [name]   . Dysarthria   . Endometriosis   . History of recurrent UTIs   . GERD (gastroesophageal reflux disease)   . Pulmonary embolism ([name]) 2011, 01/2011    Will be on lifetime coumadin  . Anemia   . Abdominal pain 01/02/2012    Overview:  Overview:  Per Previous pcp note- Dr. Ta--Pt has history of endometriosis and had DUB in 2011.  She She had u/s that showed normal endometrial stripe.  She had endometrial bx that was wnl.  She has a lot of abd cramping every month.  This cramping was sometimes not relieved with hydrocodone.  She was referred to [facility] for IUD placement to help endometriosis and abd cramping.  [name]  . Macroscopic hematuria 03/10/2013    status post replacement of suprapubic tube 03/04/2013   . HYPERTENSION, BENIGN 01/31/2010    Qualifier: Diagnosis of  By: [name], MD, [name] History of endometriosis 10/2012    OBGYN [facility]: Laparoscopic Endometrial Ablation, TVH,  . Gram positive sepsis ([name]) 10/24/2014  . HCAP (healthcare-associated pneumonia) 10/25/2014  . Abdominal pain 01/02/2012    Overview:  Overview:  Per Previous pcp note- Dr. Ta--Pt has history of endometriosis and had DUB in 2011.  She She had u/s that showed normal endometrial stripe.  She had endometrial bx that was wnl.  She has a lot of abd cramping every month.  This cramping was sometimes not relieved with hydrocodone.  She was [facility] to [facility] for IUD placement to help endometriosis and abd cramping.  [name]  Pt still complains of abd cramping, which I am treating with hydrocodone and ultram. Pt reports some initial relief of pain from IUD, but states it has now returned back to similar to previous cramping. Gave patient a shot of Toradol 60 mg IM today-since positive exacerbation of abdominal cramping during past 2 weeks. Physical exam reassuring.  [facility] need [facility] work[facility] since she is plugged in with the GYN providers there for further treatment options regarding endometriosis and abdominal cramping. The dysfunctional uterine bleeding now well contr  . Acute respiratory failure with hypoxia ([name])   . Cellulitis 12/22/2014  . Disease of female genital organs 10/24/2010    Overview:  Overview:  Pt has history of endometriosis and had DUB in 2011.  She had u/s that showed normal endometrial stripe.  She had endometrial bx that was wnl.  She has a lot of abd cramping every month.  This cramping was sometimes not relieved with hydrocodone.  [facility] to help endometriosis and abd cramping.  [name]  Pt still complains of abd cramping, which was treated with hydrocodone and ultram.   [facility]/24/11, given Depo Lupron injection.  Ordering CT scan to r/o additional cause of abdominal pain (pt with known endometriosis and will not tolerate vaginal U/S, pain not improved s/p [name] or with Lupron injection-- only caused hot flashes and amenorrhea, no pain relief).  Also made referral to pain clinic for better pain control.  Do not think hysterectomy would help with pain, and concern for contamination of baclofen pump if did proceed with surgery.    8/5/13[name]: [facility]: Pt was seen by   . Dyspnea and respiratory abnormality 02/04/2011    Overview:  Overview:  Pt has history of recurrent PE and is on chronic coumadin.  Pt has been complaining of dyspnea that she describes as chest tightness.  She has been to the ER multiple times for this.  Usually she gets CTA and serial CE.  She has had at least 6 CTA in a period of since 2011.  She also has an O2 requirement. It is unclear why pt feels dyspneic, complains of chest tightness, or has O2 requirement.  I thought that her complaint may be from deconditioning and referred her to PT.   Unfortunately Medicaid only pays for 4 PT visits.  Pt should be doing these exercises at home, but she has not.  [facility], and she has been seen by Dr Marchelle Gearingamaswamy.  He did not understand the etiology of her dyspnea and O2 requirement.  He will continue to follow her.   Last Assessment & Plan:  Pt presents with concerns for chest tightness and dyspnea.  She was seen in ED yesterday and discharged home.  While there she had a CT chest 04/20/11 showed 1.  Multifocal degradation as detailed above.  N  . Gross hematuria 03/12/2013    Overview:  Overview:  status post replacement of suprapubic tube 03/04/2013  Last Assessment & Plan:  Recent urine culture collected in ED on 5/4 did not result in significant growth. Moreover, patient has been asymptomatic with normal WBC, no fever and no true symptoms making symptomatic UTI  less likely.  Will continue to monitor for any sign of infection.  Patient's xarelto had been held during her recent hospitalization due to hematuria. This has since been re-started. Monitor bleeding.    [name] Kitchen History of anticoagulant therapy 10/21/2012  . Infantile cerebral palsy ([name]) 01/29/2007    Overview:  She has spastic CP.  She is mentally intact.  She is wheelchair bound.  She is wheelchair bound for ambulation      . Intracervical pessary 05/07/2011    Overview:  Overview:  Placed by [facility] 04/30/11 to treat DUB and Endometriosis.  She is seen at GYN clinic at [facility] but was considered a poor surgical candidate and referred her to [facility].  For DUB she had pelvic ultrasound that showed thin stripe and she had endometrial Bx by Dr Jennette Kettle in GYN clinic, which was negative.     . Parapneumonic effusion 10/25/2014  . Presence of intrathecal baclofen pump 03/07/2014    R LQ. Insertion T10.    [name] Kitchen Psychiatric illness 10/31/2014  . Recurrent pulmonary embolism ([name]) 05/03/2011    Pt has history of recurrent PE and is on chronic coumadin.  Pt has been complaining of dyspnea that  she describes as chest tightness.  She has been to the ER multiple times for this.  Usually she gets CTA and serial CE.  She has had at least 6 CTA in a period of since 2011.  She also has an O2 requirement. It is unclear why pt feels dyspneic, complains of chest tightness, or has O2 requirement.  I thought that her complaint may be from deconditioning and referred her to PT.  Unfortunately Medicaid only pays for 4 PT visits.  Pt should be doing these exercises at home, but she has not.  I have referred her to [facility], and she has been seen by [name].  He did not understand the etiology of her dyspnea and O2 requirement.  He will continue to follow her.     . Seborrheic keratosis 01/18/2011  . Spasticity 01/05/2014  . Transient alteration of awareness, recurrent 09/08/2006    Recurrent episodes where Lamika  loses contact with her world and that have been studied thoroughly and appear nonepileptic in nature per Dr Terrace Arabia (neurology) The patient has had episodes starting in 2007 that initially began 1-4 times a day during which time she would have periods of unawareness followed by confusion. This continuous video EEG monitoring performed from October 8-12, 2007. Had   . Acute cystitis without hematuria   . Acute cystitis with hematuria   . Urethra dilated and patulous 07/03/2015    Patulous, Dilated Urethra. 5mL balloon on a 22-Fr catheter hoping this would prevent the bladder spasm from pushing the balloon down the urethra (Dr Logan Bores, Urology [facility], July 2016)   . Seizures ([name]) 02/12/2016  . Incontinent of feces 03/11/2016   Past Surgical History  Procedure Laterality Date  . Colposcopy  06/2000  . Cesarean section      x 2  . Tubal ligation  2003  . Carpal tunnel release  08/2008    Dr Teressa Senter  . Wrist surgery  06/2010    Dr Dierdre Searles, hand surgeon, [name]  . Baclofen pump refill      x 3 times  . Appendectomy    . Intrauterine device insertion  04/30/11    Inserted by [facility] for endometriosis    . Laparoscopically assisted vag hysterectomy  10/27/2012  . Multiple extractions with alveoloplasty  Bilateral 07/19/2013    Procedure: EXTRACTIONS #4, 1,61,09,60;  Surgeon: Francene Finders, DDS;  Location: [facility] OR;  Service: Oral Surgery;  Laterality: Bilateral;  . Pain pump implantation N/A 03/07/2014    Procedure: baclofen pump revision/replacement and Catheter connection replacement;  Surgeon: Cristi Loron, MD;  Location: [facility] OR[facility];  Service: Neurosurgery;  Laterality: N/A;  baclofen pump revision/replacement and Catheter connection replacement  . Programable baclofen pump revision  03/17/14    Battery Replacement   . Cholecystectomy  10/14/2015    Procedure: LAPAROSCOPIC CHOLECYSTECTOMY;  Surgeon: Violeta Gelinas, MD;  Location: [facility] OR;  Service: General;;  . Abdominal hysterectomy    . Neuroma surgery Left     anterior and posterior intersosseous  . Urethra surgery      Allergies  Allergen Reactions  . Morphine Dermatitis and Other (See Comments)    Skin turned red      Medication List       This list is accurate as of: 04/01/16  2:42 PM.  Always use your most recent med list.               acetaminophen 325 MG tablet  Commonly known as:  TYLENOL  Take 650 mg by mouth. Every 6 hours as needed     amantadine 100 MG capsule  Commonly known as:  SYMMETREL  Take 100 mg by mouth. Take one tablet daily     baclofen 20 MG tablet  Commonly known as:  LIORESAL  Take 20 mg by mouth 4 (four) times daily. For bladder spasms     bisacodyl 10 MG suppository  Commonly known as:  DULCOLAX  Place 10 mg rectally as needed for moderate constipation.     clonazePAM 0.5 MG tablet  Commonly known as:  KLONOPIN  Take 0.5 mg by mouth. Take one three times daily     [employer] MILK OF MAGNESIA 400 MG/5ML suspension  Generic drug:  magnesium hydroxide  Take by mouth. Take 30ml as needed     dextromethorphan-guaiFENesin 30-600 MG 12hr tablet  Commonly known as:  MUCINEX DM  Take 1  tablet by mouth 2 (two) times daily.     feeding supplement (ENSURE ENLIVE) Liqd  Take 237 mLs by mouth 2 (two) times daily between meals.     flavoxATE 100 MG tablet  Commonly known as:  URISPAS  Take 100 mg by mouth 3 (three) times daily as needed for bladder spasms.     FLUoxetine 20 MG tablet  Commonly known as:  PROZAC  Take 20 mg by mouth daily.     ibuprofen 200 MG tablet  Commonly known as:  ADVIL,MOTRIN  Take 200 mg by mouth. Take 2 tablets every 8 hours as needed     lamoTRIgine 100 MG tablet  Commonly known as:  LAMICTAL  Take 100 mg by mouth at bedtime.     Melatonin 5 MG Tabs  Take by mouth. Take one tablet at bedtime     multivitamin tablet  Take 1 tablet by mouth daily.     omeprazole 20 MG capsule  Commonly known as:  PRILOSEC  Take 20 mg by mouth. Take one tablet daily     ondansetron 8 MG disintegrating tablet  Commonly known as:  ZOFRAN-ODT  Take 1 tablet (8 mg total) by mouth every 8 (eight) hours as needed for nausea or vomiting.     oxybutynin 5 MG tablet  Commonly known as:  DITROPAN  Take 5 mg by mouth. Take  one tablet four times a day     oxyCODONE 5 MG immediate release tablet  Commonly known as:  Oxy IR/ROXICODONE  Take 5 mg by mouth. Take one tablet every 4 hours as needed for pain     promethazine 12.5 MG tablet  Commonly known as:  PHENERGAN  Take 12.5 mg by mouth every 6 (six) hours as needed for nausea or vomiting.     QUEtiapine 100 MG tablet  Commonly known as:  SEROQUEL  Take 1 tablet (100 mg total) by mouth at bedtime.     senna 8.6 MG Tabs tablet  Commonly known as:  SENOKOT  1 nightly to help constipation     tiZANidine 4 MG capsule  Commonly known as:  ZANAFLEX  Take 4 mg by mouth 2 (two) times daily at 8 am and 10 pm.     warfarin 7.5 MG tablet  Commonly known as:  COUMADIN  1 tablet daily to anticoagulate blood to prevent pulmonary emboli        Review of Systems  Constitutional: Negative for fever, chills,  diaphoresis, activity change, appetite change, fatigue and unexpected weight change.  HENT: Negative for congestion, ear discharge, ear pain, hearing loss, postnasal drip, rhinorrhea, sore throat, tinnitus, trouble swallowing and voice change.   Eyes: Positive for visual disturbance (corrective lenses). Negative for pain, redness and itching.  Respiratory: Positive for shortness of breath. Negative for cough, choking and wheezing.   Cardiovascular: Positive for leg swelling. Negative for chest pain and palpitations.  Gastrointestinal: Positive for constipation. Negative for nausea, abdominal pain, diarrhea and abdominal distention.       GERD. Episodes of fecal incontinence.  Endocrine: Negative for cold intolerance, heat intolerance, polydipsia, polyphagia and polyuria.  Genitourinary: Negative for dysuria, urgency, frequency, hematuria, flank pain, vaginal discharge, difficulty urinating and pelvic pain.       Suprapubic catheter. History of urethral closure.  Musculoskeletal: Negative for myalgias, back pain, arthralgias, gait problem, neck pain and neck stiffness.       Spastic cerebral palsy with multiple joint contractures  Skin: Negative for color change, pallor and rash.  Allergic/Immunologic: Negative.   Neurological: Negative for dizziness, tremors, seizures, syncope, weakness, numbness and headaches.       Cerebral palsy from birth. Spastic and athetoid movements present.  Hematological: Negative for adenopathy. Does not bruise/bleed easily.       History of anemia  Psychiatric/Behavioral: Positive for sleep disturbance and dysphoric mood. Negative for suicidal ideas, hallucinations, behavioral problems, confusion and agitation. The patient is nervous/anxious. The patient is not hyperactive.        Chronic anxiety    Immunization History  Administered Date(s) Administered  . H1N1 11/04/2008  . Influenza Whole 11/09/2008, 10/05/2009  . Influenza,inj,Quad PF,36+ Mos 10/24/2014  .  Influenza-Unspecified 09/13/2015  . Pneumococcal Polysaccharide-23 10/24/2014  . Td 12/02/1994, 03/18/2008   Pertinent  Health Maintenance Due  Topic Date Due  . PAP SMEAR  11/12/2013  . INFLUENZA VACCINE  07/02/2016   Fall Risk  02/06/2016 12/22/2015  Falls in the past year? Exclusion - non ambulatory Exclusion - non ambulatory   Functional Status Survey:    Filed Vitals:   04/01/16 1413  BP: 96/66  Pulse: 64  Temp: 97.1 F (36.2 C)  Resp: 18  Height: 5' 0.5" (1.537 m)  Weight: 117 lb (53.071 kg)  SpO2: 95%   Body mass index is 22.47 kg/(m^2). Physical Exam  Constitutional: She is oriented to person, place, and time. She appears well-developed  and well-nourished. No distress.  HENT:  Head: Normocephalic and atraumatic.  Right Ear: External ear normal.  Left Ear: External ear normal.  Nose: Nose normal.  Mouth/Throat: Oropharynx is clear and moist. No oropharyngeal exudate.  Eyes: Conjunctivae and EOM are normal. Pupils are equal, round, and reactive to light. No scleral icterus.  Neck: Normal range of motion. Neck supple. No JVD present. No tracheal deviation present. No thyromegaly present.  Cardiovascular: Normal rate, regular rhythm, normal heart sounds and intact distal pulses.  Exam reveals no gallop and no friction rub.   No murmur heard. Pulmonary/Chest: Effort normal and breath sounds normal. No respiratory distress. She has no wheezes. She has no rales. She exhibits no tenderness.  Abdominal: Soft. Bowel sounds are normal. She exhibits no distension and no mass. There is no tenderness.  Suprapubic foley  Genitourinary:  Suprapubic Foley catheter  Musculoskeletal: Normal range of motion. She exhibits edema (trace). She exhibits no tenderness.  Multiple joint contractures  Lymphadenopathy:    She has no cervical adenopathy.  Neurological: She is alert and oriented to person, place, and time. No cranial nerve deficit. Coordination normal.  Spastic cerebral palsy  with athetoid movements  Skin: Skin is warm and dry. No rash noted. She is not diaphoretic. No erythema. No pallor.  Psychiatric: She has a normal mood and affect. Her behavior is normal. Judgment and thought content normal.    Labs reviewed:  Recent Labs  10/09/15 0500 10/09/15 1211  10/24/15 0545  10/28/15 0615 10/29/15 0326 11/13/15 02/16/16 02/23/16 1801  NA  --   --   < >  --   < > 137 140 140 141 141  K  --   --   < >  --   < > 4.5 3.7 4.1 4.0 4.1  CL  --   --   < >  --   < > 102 104  --   --  102  CO2  --   --   < >  --   < > 30 29  --   --  27  GLUCOSE  --   --   < >  --   < > 117* 100*  --   --  88  BUN  --   --   < >  --   < > <5* <5* 10 6 6   CREATININE  --   --   < >  --   < > 0.42* 0.46 0.5 0.5 0.43*  CALCIUM  --   --   < >  --   < > 8.7* 8.8*  --   --  9.1  MG 1.6* 2.7*  --  2.0  --   --   --   --   --   --   PHOS 2.7  --   --   --   --   --   --   --   --   --   < > = values in this interval not displayed.  Recent Labs  10/18/15 1041 10/19/15 0555 10/20/15 0520 11/13/15 02/16/16  AST 20 11* 14* 15 13  ALT 19 16 14 17 17   ALKPHOS 200* 184* 162* 133* 90  BILITOT 0.5 0.7 0.6  --   --   PROT 5.5* 5.4* 5.7*  --   --   ALBUMIN 2.2* 2.2* 2.1*  --   --     Recent Labs  10/19/15 0555  10/23/15 0450  10/28/15 0615 10/29/15 0326 11/13/15 02/16/16 02/23/16 1801  WBC 5.9  < >  --   < > 6.2 6.3 8.2 5.3 6.0  NEUTROABS 3.7  --  9.6*  --   --   --   --   --  3.4  HGB 8.5*  < >  --   < > 9.5* 10.5* 9.7* 11.9* 13.8  HCT 27.6*  < >  --   < > 31.6* 34.1* 32* 37 41.5  MCV 92.0  < >  --   < > 92.4 92.4  --   --  87.0  PLT 356  < >  --   < > 389 342 389 221 291  < > = values in this interval not displayed. Lab Results  Component Value Date   TSH 0.710 06/12/2015   Lab Results  Component Value Date   HGBA1C 6.0 07/25/2009   Lab Results  Component Value Date   CHOL 150 01/29/2007   HDL 41 01/29/2007   LDLCALC 92 01/29/2007   TRIG 83 01/29/2007   CHOLHDL 3.7  Ratio 01/29/2007    Assessment/Plan 1. Constipation, chronic Increase Senokot-S to 2 tablets nightly  2. Chronic anticoagulation Increase warfarin to 8.5 mg daily

## 2016-04-02 ENCOUNTER — Ambulatory Visit (HOSPITAL_BASED_OUTPATIENT_CLINIC_OR_DEPARTMENT_OTHER): Payer: Medicare Other | Admitting: Physical Medicine & Rehabilitation

## 2016-04-02 ENCOUNTER — Encounter: Payer: Medicare Other | Attending: Physical Medicine & Rehabilitation

## 2016-04-02 ENCOUNTER — Encounter: Payer: Self-pay | Admitting: Physical Medicine & Rehabilitation

## 2016-04-02 VITALS — BP 111/75 | HR 67 | Resp 14

## 2016-04-02 DIAGNOSIS — G801 Spastic diplegic cerebral palsy: Secondary | ICD-10-CM | POA: Diagnosis not present

## 2016-04-02 DIAGNOSIS — R252 Cramp and spasm: Secondary | ICD-10-CM | POA: Diagnosis not present

## 2016-04-02 NOTE — Patient Instructions (Signed)

## 2016-04-02 NOTE — Progress Notes (Signed)
Botox Injection for spasticity using needle EMG guidance  Dilution: 50 Units/ml Indication: Severe spasticity which interferes with ADL,mobility and/or  hygiene and is unresponsive to medication management and other conservative care Informed consent was obtained after describing risks and benefits of the procedure with the patient. This includes bleeding, bruising, infection, excessive weakness, or medication side effects. A REMS form is on file and signed. Needle: 27 g 1" needle electrode Number of units per muscle  Biceps 25 L and 25 R FCR25 L and 50R  FDS50L and 50R FDP25L and 50 R FPL50L Pronator 25L and 25R  All injections were done after obtaining appropriate EMG activity and after negative drawback for blood. The patient tolerated the procedure well. Post procedure instructions were given. A followup appointment was made.

## 2016-04-15 DIAGNOSIS — M24562 Contracture, left knee: Secondary | ICD-10-CM | POA: Diagnosis not present

## 2016-04-15 DIAGNOSIS — R2681 Unsteadiness on feet: Secondary | ICD-10-CM | POA: Diagnosis not present

## 2016-04-15 DIAGNOSIS — R293 Abnormal posture: Secondary | ICD-10-CM | POA: Diagnosis not present

## 2016-04-15 DIAGNOSIS — R6521 Severe sepsis with septic shock: Secondary | ICD-10-CM | POA: Diagnosis not present

## 2016-04-15 DIAGNOSIS — G809 Cerebral palsy, unspecified: Secondary | ICD-10-CM | POA: Diagnosis not present

## 2016-04-15 DIAGNOSIS — Z1612 Extended spectrum beta lactamase (ESBL) resistance: Secondary | ICD-10-CM | POA: Diagnosis not present

## 2016-04-15 DIAGNOSIS — M24561 Contracture, right knee: Secondary | ICD-10-CM | POA: Diagnosis not present

## 2016-04-15 DIAGNOSIS — M6281 Muscle weakness (generalized): Secondary | ICD-10-CM | POA: Diagnosis not present

## 2016-04-15 DIAGNOSIS — A419 Sepsis, unspecified organism: Secondary | ICD-10-CM | POA: Diagnosis not present

## 2016-04-15 DIAGNOSIS — R41841 Cognitive communication deficit: Secondary | ICD-10-CM | POA: Diagnosis not present

## 2016-04-15 DIAGNOSIS — G808 Other cerebral palsy: Secondary | ICD-10-CM | POA: Diagnosis not present

## 2016-04-15 DIAGNOSIS — R1311 Dysphagia, oral phase: Secondary | ICD-10-CM | POA: Diagnosis not present

## 2016-04-16 DIAGNOSIS — A419 Sepsis, unspecified organism: Secondary | ICD-10-CM | POA: Diagnosis not present

## 2016-04-16 DIAGNOSIS — M24561 Contracture, right knee: Secondary | ICD-10-CM | POA: Diagnosis not present

## 2016-04-16 DIAGNOSIS — R293 Abnormal posture: Secondary | ICD-10-CM | POA: Diagnosis not present

## 2016-04-16 DIAGNOSIS — R2681 Unsteadiness on feet: Secondary | ICD-10-CM | POA: Diagnosis not present

## 2016-04-16 DIAGNOSIS — M24562 Contracture, left knee: Secondary | ICD-10-CM | POA: Diagnosis not present

## 2016-04-16 DIAGNOSIS — M6281 Muscle weakness (generalized): Secondary | ICD-10-CM | POA: Diagnosis not present

## 2016-04-17 DIAGNOSIS — M24561 Contracture, right knee: Secondary | ICD-10-CM | POA: Diagnosis not present

## 2016-04-17 DIAGNOSIS — A419 Sepsis, unspecified organism: Secondary | ICD-10-CM | POA: Diagnosis not present

## 2016-04-17 DIAGNOSIS — R293 Abnormal posture: Secondary | ICD-10-CM | POA: Diagnosis not present

## 2016-04-17 DIAGNOSIS — M24562 Contracture, left knee: Secondary | ICD-10-CM | POA: Diagnosis not present

## 2016-04-17 DIAGNOSIS — M6281 Muscle weakness (generalized): Secondary | ICD-10-CM | POA: Diagnosis not present

## 2016-04-17 DIAGNOSIS — R2681 Unsteadiness on feet: Secondary | ICD-10-CM | POA: Diagnosis not present

## 2016-04-18 DIAGNOSIS — A419 Sepsis, unspecified organism: Secondary | ICD-10-CM | POA: Diagnosis not present

## 2016-04-18 DIAGNOSIS — M24562 Contracture, left knee: Secondary | ICD-10-CM | POA: Diagnosis not present

## 2016-04-18 DIAGNOSIS — M24561 Contracture, right knee: Secondary | ICD-10-CM | POA: Diagnosis not present

## 2016-04-18 DIAGNOSIS — R339 Retention of urine, unspecified: Secondary | ICD-10-CM | POA: Diagnosis not present

## 2016-04-18 DIAGNOSIS — R293 Abnormal posture: Secondary | ICD-10-CM | POA: Diagnosis not present

## 2016-04-18 DIAGNOSIS — R2681 Unsteadiness on feet: Secondary | ICD-10-CM | POA: Diagnosis not present

## 2016-04-18 DIAGNOSIS — M6281 Muscle weakness (generalized): Secondary | ICD-10-CM | POA: Diagnosis not present

## 2016-04-19 DIAGNOSIS — R293 Abnormal posture: Secondary | ICD-10-CM | POA: Diagnosis not present

## 2016-04-19 DIAGNOSIS — R2681 Unsteadiness on feet: Secondary | ICD-10-CM | POA: Diagnosis not present

## 2016-04-19 DIAGNOSIS — A419 Sepsis, unspecified organism: Secondary | ICD-10-CM | POA: Diagnosis not present

## 2016-04-19 DIAGNOSIS — M6281 Muscle weakness (generalized): Secondary | ICD-10-CM | POA: Diagnosis not present

## 2016-04-19 DIAGNOSIS — M24561 Contracture, right knee: Secondary | ICD-10-CM | POA: Diagnosis not present

## 2016-04-19 DIAGNOSIS — M24562 Contracture, left knee: Secondary | ICD-10-CM | POA: Diagnosis not present

## 2016-04-22 DIAGNOSIS — M24562 Contracture, left knee: Secondary | ICD-10-CM | POA: Diagnosis not present

## 2016-04-22 DIAGNOSIS — R293 Abnormal posture: Secondary | ICD-10-CM | POA: Diagnosis not present

## 2016-04-22 DIAGNOSIS — R2681 Unsteadiness on feet: Secondary | ICD-10-CM | POA: Diagnosis not present

## 2016-04-22 DIAGNOSIS — M24561 Contracture, right knee: Secondary | ICD-10-CM | POA: Diagnosis not present

## 2016-04-22 DIAGNOSIS — A419 Sepsis, unspecified organism: Secondary | ICD-10-CM | POA: Diagnosis not present

## 2016-04-22 DIAGNOSIS — M6281 Muscle weakness (generalized): Secondary | ICD-10-CM | POA: Diagnosis not present

## 2016-04-23 DIAGNOSIS — R2681 Unsteadiness on feet: Secondary | ICD-10-CM | POA: Diagnosis not present

## 2016-04-23 DIAGNOSIS — M24562 Contracture, left knee: Secondary | ICD-10-CM | POA: Diagnosis not present

## 2016-04-23 DIAGNOSIS — M24561 Contracture, right knee: Secondary | ICD-10-CM | POA: Diagnosis not present

## 2016-04-23 DIAGNOSIS — R293 Abnormal posture: Secondary | ICD-10-CM | POA: Diagnosis not present

## 2016-04-23 DIAGNOSIS — A419 Sepsis, unspecified organism: Secondary | ICD-10-CM | POA: Diagnosis not present

## 2016-04-23 DIAGNOSIS — M6281 Muscle weakness (generalized): Secondary | ICD-10-CM | POA: Diagnosis not present

## 2016-04-24 DIAGNOSIS — M6281 Muscle weakness (generalized): Secondary | ICD-10-CM | POA: Diagnosis not present

## 2016-04-24 DIAGNOSIS — R293 Abnormal posture: Secondary | ICD-10-CM | POA: Diagnosis not present

## 2016-04-24 DIAGNOSIS — A419 Sepsis, unspecified organism: Secondary | ICD-10-CM | POA: Diagnosis not present

## 2016-04-24 DIAGNOSIS — M24561 Contracture, right knee: Secondary | ICD-10-CM | POA: Diagnosis not present

## 2016-04-24 DIAGNOSIS — M24562 Contracture, left knee: Secondary | ICD-10-CM | POA: Diagnosis not present

## 2016-04-24 DIAGNOSIS — R2681 Unsteadiness on feet: Secondary | ICD-10-CM | POA: Diagnosis not present

## 2016-04-25 DIAGNOSIS — M6281 Muscle weakness (generalized): Secondary | ICD-10-CM | POA: Diagnosis not present

## 2016-04-25 DIAGNOSIS — R2681 Unsteadiness on feet: Secondary | ICD-10-CM | POA: Diagnosis not present

## 2016-04-25 DIAGNOSIS — M24562 Contracture, left knee: Secondary | ICD-10-CM | POA: Diagnosis not present

## 2016-04-25 DIAGNOSIS — M24561 Contracture, right knee: Secondary | ICD-10-CM | POA: Diagnosis not present

## 2016-04-25 DIAGNOSIS — A419 Sepsis, unspecified organism: Secondary | ICD-10-CM | POA: Diagnosis not present

## 2016-04-25 DIAGNOSIS — R293 Abnormal posture: Secondary | ICD-10-CM | POA: Diagnosis not present

## 2016-04-26 DIAGNOSIS — M24561 Contracture, right knee: Secondary | ICD-10-CM | POA: Diagnosis not present

## 2016-04-26 DIAGNOSIS — R2681 Unsteadiness on feet: Secondary | ICD-10-CM | POA: Diagnosis not present

## 2016-04-26 DIAGNOSIS — A419 Sepsis, unspecified organism: Secondary | ICD-10-CM | POA: Diagnosis not present

## 2016-04-26 DIAGNOSIS — R293 Abnormal posture: Secondary | ICD-10-CM | POA: Diagnosis not present

## 2016-04-26 DIAGNOSIS — M6281 Muscle weakness (generalized): Secondary | ICD-10-CM | POA: Diagnosis not present

## 2016-04-26 DIAGNOSIS — M24562 Contracture, left knee: Secondary | ICD-10-CM | POA: Diagnosis not present

## 2016-04-30 ENCOUNTER — Telehealth: Payer: Self-pay | Admitting: Physical Medicine & Rehabilitation

## 2016-04-30 NOTE — Telephone Encounter (Signed)
Please call pt about botox in arm

## 2016-05-01 NOTE — Telephone Encounter (Signed)
Left message on machine to return call. 

## 2016-05-02 DIAGNOSIS — F331 Major depressive disorder, recurrent, moderate: Secondary | ICD-10-CM | POA: Diagnosis not present

## 2016-05-06 ENCOUNTER — Encounter: Payer: Self-pay | Admitting: Internal Medicine

## 2016-05-06 ENCOUNTER — Non-Acute Institutional Stay (SKILLED_NURSING_FACILITY): Payer: Medicare Other | Admitting: Internal Medicine

## 2016-05-06 DIAGNOSIS — R293 Abnormal posture: Secondary | ICD-10-CM | POA: Diagnosis not present

## 2016-05-06 DIAGNOSIS — K5900 Constipation, unspecified: Secondary | ICD-10-CM

## 2016-05-06 DIAGNOSIS — A419 Sepsis, unspecified organism: Secondary | ICD-10-CM | POA: Diagnosis not present

## 2016-05-06 DIAGNOSIS — R2681 Unsteadiness on feet: Secondary | ICD-10-CM | POA: Diagnosis not present

## 2016-05-06 DIAGNOSIS — Z1612 Extended spectrum beta lactamase (ESBL) resistance: Secondary | ICD-10-CM | POA: Diagnosis not present

## 2016-05-06 DIAGNOSIS — R41841 Cognitive communication deficit: Secondary | ICD-10-CM | POA: Diagnosis not present

## 2016-05-06 DIAGNOSIS — F428 Other obsessive-compulsive disorder: Secondary | ICD-10-CM

## 2016-05-06 DIAGNOSIS — M24561 Contracture, right knee: Secondary | ICD-10-CM | POA: Diagnosis not present

## 2016-05-06 DIAGNOSIS — R6521 Severe sepsis with septic shock: Secondary | ICD-10-CM | POA: Diagnosis not present

## 2016-05-06 DIAGNOSIS — G809 Cerebral palsy, unspecified: Secondary | ICD-10-CM | POA: Diagnosis not present

## 2016-05-06 DIAGNOSIS — F429 Obsessive-compulsive disorder, unspecified: Secondary | ICD-10-CM | POA: Diagnosis not present

## 2016-05-06 DIAGNOSIS — K5909 Other constipation: Secondary | ICD-10-CM

## 2016-05-06 DIAGNOSIS — M6281 Muscle weakness (generalized): Secondary | ICD-10-CM | POA: Diagnosis not present

## 2016-05-06 DIAGNOSIS — G808 Other cerebral palsy: Secondary | ICD-10-CM | POA: Diagnosis not present

## 2016-05-06 DIAGNOSIS — M24562 Contracture, left knee: Secondary | ICD-10-CM | POA: Diagnosis not present

## 2016-05-06 DIAGNOSIS — R1311 Dysphagia, oral phase: Secondary | ICD-10-CM | POA: Diagnosis not present

## 2016-05-06 NOTE — Progress Notes (Signed)
Patient ID: [name] [name], female   DOB: 02-28-1976, 40 y.o.   MRN: [id number]  Location:  [facility] Room Number: 413B Place of Service:  SNF (31) Provider:  Kimber Relic, MD  Patient Care Team: Kimber Relic, MD as PCP - General (Internal Medicine) Jamison Neighbor, MD as Consulting Physician (Urology) Deetta Perla, MD as Consulting Physician (Neurology) Levert Feinstein, MD as Consulting Physician (Neurology) Erick Colace, MD as Consulting Physician (Physical Medicine and Rehabilitation)  Extended Emergency Contact Information Primary Emergency Contact: Smolenski,[name] Address: [address], [place] Home Phone: [phone] Relation: Father Secondary Emergency Contact: Melanie Crazier [place] Home Phone: [phone] Relation: Friend  Code Status:  full Goals of care: Advanced Directive information Advanced Directives 05/06/2016  Does patient have an advance directive? No  Would patient like information on creating an advanced directive? -     Chief Complaint  Patient presents with  . Acute Visit    constipation, patient said she hasn't had bowel movement for a week.     HPI:  Pt is a 40 y.o. female seen today for an acute visit for Constipation. Patient has had difficulty with bowel movements. Some laxity of seem to cause her to go to the bathroom too frequently. She currently is on Dulcolax and psyllium. MiraLAX seemed to cause mushy and difficult to control bowel movements. Senokot was discontinued 04/22/2016. Dulcolax 10 mg daily was added at that time.  She denies nausea or abdominal cramping. There has not been any blood in the stool.   Past Medical History  Diagnosis Date  . Cerebral palsy ([name])   . Pulmonary embolism ([name])     Lifetime Coumadin  . Cerebral palsy ([name])     Spastic Cerebral palsy, mentally intact  . Contracture, joint, multiple sites     Electric  wheelchair, uses left hand to operate chair.   [name] Kitchen Hypertension   . OCD (obsessive compulsive disorder)   . Depression   . Migraine   . [name]   . Dysarthria   . Endometriosis   . History of recurrent UTIs   . GERD (gastroesophageal reflux disease)   . Pulmonary embolism ([name]) 2011, 01/2011    Will be on lifetime coumadin  . Anemia   . Abdominal pain 01/02/2012    Overview:  Overview:  Per Previous pcp note- Dr. Ta--Pt has history of endometriosis and had DUB in 2011.  She She had u/s that showed normal endometrial stripe.  She had endometrial bx that was wnl.  She has a lot of abd cramping every month.  This cramping was sometimes not relieved with hydrocodone.  She was referred to [facility] for IUD placement to help endometriosis and abd cramping.  [name]  . Macroscopic hematuria 03/10/2013    status post replacement of suprapubic tube 03/04/2013   . HYPERTENSION, BENIGN 01/31/2010    Qualifier: Diagnosis of  By: [name], MD, [name] History of endometriosis 10/2012    OBGYN [facility]: Laparoscopic Endometrial Ablation, TVH,  . Gram positive sepsis ([name]) 10/24/2014  . HCAP (healthcare-associated pneumonia) 10/25/2014  . Abdominal pain 01/02/2012    Overview:  Overview:  Per Previous pcp note- Dr. Ta--Pt has history of endometriosis and had DUB in 2011.  She She had u/s that showed normal endometrial stripe.  She had endometrial bx that was wnl.  She has a lot of abd cramping every month.  This cramping was sometimes not relieved with hydrocodone.  [facility] for IUD placement to help endometriosis and abd cramping.  [name] [facility].  Pt still complains of abd cramping, which I am treating with hydrocodone and ultram. Pt reports some initial relief of pain from IUD, but states it has now returned back to similar to previous cramping. Gave patient a shot of Toradol 60 mg IM today-since positive exacerbation of abdominal cramping  during past 2 weeks. Physical exam reassuring. Patient [facility] need [facility] since she is plugged in with the GYN providers there for further treatment options regarding endometriosis and abdominal cramping. The dysfunctional uterine bleeding now well contr  . Acute respiratory failure with hypoxia ([name])   . Cellulitis 12/22/2014  . Disease of female genital organs 10/24/2010    Overview:  Overview:  Pt has history of endometriosis and had DUB in 2011.  She had u/s that showed normal endometrial stripe.  She had endometrial bx that was wnl.  She has a lot of abd cramping every month.  This cramping was sometimes not relieved with hydrocodone.  She was referred to [facility] for IUD placement to help endometriosis and abd cramping.  [name].  Pt still complains of abd cramping, which was treated with hydrocodone and ultram.   [facility] 12/24/11, given Depo Lupron injection.  Ordering CT scan to r/o additional cause of abdominal pain (pt with known endometriosis and will not tolerate vaginal U/S, pain not improved s/p [name] or with Lupron injection-- only caused hot flashes and amenorrhea, no pain relief).  Also made referral to pain clinic for better pain control.  Do not think hysterectomy would help with pain, and concern for contamination of baclofen pump if did proceed with surgery.    8/5/13[name]: [facility]: Pt was seen by   . Dyspnea and respiratory abnormality 02/04/2011    Overview:  Overview:  Pt has history of recurrent PE and is on chronic coumadin.  Pt has been complaining of dyspnea that she describes as chest tightness.  She has been to the ER multiple times for this.  Usually she gets CTA and serial CE.  She has had at least 6 CTA in a period of since 2011.  She also has an O2 requirement. It is unclear why pt feels dyspneic, complains of chest tightness, or has O2 requirement.  I thought that her complaint may be from  deconditioning and referred her to PT.  Unfortunately Medicaid only pays for 4 PT visits.  Pt should be doing these exercises at home, but she has not.  [facility], and she has been seen by Dr Marchelle Gearingamaswamy.  He did not understand the etiology of her dyspnea and O2 requirement.  He will continue to follow her.   Last Assessment & Plan:  Pt presents with concerns for chest tightness and dyspnea.  She was seen in ED yesterday and discharged home.  While there she had a CT chest 04/20/11 showed 1.  Multifocal degradation as detailed above.  N  . Gross hematuria 03/12/2013    Overview:  Overview:  status post replacement of suprapubic tube 03/04/2013  Last Assessment & Plan:  Recent urine culture collected in ED on 5/4 did not result in significant growth. Moreover, patient has been asymptomatic with normal  WBC, no fever and no true symptoms making symptomatic UTI less likely.  Will continue to monitor for any sign of infection.  Patient's xarelto had been held during her recent hospitalization due to hematuria. This has since been re-started. Monitor bleeding.    [name] Kitchen History of anticoagulant therapy 10/21/2012  . Infantile cerebral palsy ([name]) 01/29/2007    Overview:  She has spastic CP.  She is mentally intact.  She is wheelchair bound.  She is wheelchair bound for ambulation      . Intracervical pessary 05/07/2011    Overview:  Overview:  Placed by [facility] 04/30/11 to treat DUB and Endometriosis.  She is seen at G[facility] clinic at [facility] but was considered a poor surgical candidate and referred her to [facility].  For DUB she had pelvic ultrasound that showed thin stripe and she had endometrial Bx by Dr Jennette Kettle in GYN clinic, which was negative.     . Parapneumonic effusion 10/25/2014  . Presence of intrathecal baclofen pump 03/07/2014    R LQ. Insertion T10.    [name] Kitchen Psychiatric illness 10/31/2014  . Recurrent pulmonary embolism ([name]) 05/03/2011    Pt has history of recurrent PE and is on chronic coumadin.  Pt  has been complaining of dyspnea that she describes as chest tightness.  She has been to the ER multiple times for this.  Usually she gets CTA and serial CE.  She has had at least 6 CTA in a period of since 2011.  She also has an O2 requirement. It is unclear why pt feels dyspneic, complains of chest tightness, or has O2 requirement.  I thought that her complaint may be from deconditioning and referred her to PT.  Unfortunately Medicaid only pays for 4 PT visits.  Pt should be doing these exercises at home, but she has not.  I have referred her to [facility], and she has been seen by [name].  He did not understand the etiology of her dyspnea and O2 requirement.  He will continue to follow her.     . Seborrheic keratosis 01/18/2011  . Spasticity 01/05/2014  . Transient alteration of awareness, recurrent 09/08/2006    Recurrent episodes where Alleigh  loses contact with her world and that have been studied thoroughly and appear nonepileptic in nature per Dr Terrace Arabia (neurology) The patient has had episodes starting in 2007 that initially began 1-4 times a day during which time she would have periods of unawareness followed by confusion. This continuous video EEG monitoring performed from October 8-12, 2007. Had   . Acute cystitis without hematuria   . Acute cystitis with hematuria   . Urethra dilated and patulous 07/03/2015    Patulous, Dilated Urethra. 5mL balloon on a 22-Fr catheter hoping this would prevent the bladder spasm from pushing the balloon down the urethra (Dr Logan Bores, Urology [facility], July 2016)   . Seizures ([name]) 02/12/2016  . Incontinent of feces 03/11/2016   Past Surgical History  Procedure Laterality Date  . Colposcopy  06/2000  . Cesarean section      x 2  . Tubal ligation  2003  . Carpal tunnel release  08/2008    Dr Teressa Senter  . Wrist surgery  06/2010    Dr Dierdre Searles, hand surgeon, [name]  . Baclofen pump refill      x 3 times  . Appendectomy    . Intrauterine device insertion  04/30/11    Inserted  by [facility] for endometriosis  . Laparoscopically assisted vag  hysterectomy  10/27/2012  . Multiple extractions with alveoloplasty Bilateral 07/19/2013    Procedure: EXTRACTIONS #4, 7,82,95,62;  Surgeon: Francene Finders, DDS;  Location: [facility] OR;  Service: Oral Surgery;  Laterality: Bilateral;  . Pain pump implantation N/A 03/07/2014    Procedure: baclofen pump revision/replacement and Catheter connection replacement;  Surgeon: Cristi Loron, MD;  Location: [facility] ORS;  Service: Neurosurgery;  Laterality: N/A;  baclofen pump revision/replacement and Catheter connection replacement  . Programable baclofen pump revision  03/17/14    Battery Replacement   . Cholecystectomy  10/14/2015    Procedure: LAPAROSCOPIC CHOLECYSTECTOMY;  Surgeon: Violeta Gelinas, MD;  Location: [facility] OR;  Service: General;;  . Abdominal hysterectomy    . Neuroma surgery Left     anterior and posterior intersosseous  . Urethra surgery      Allergies  Allergen Reactions  . Morphine Dermatitis and Other (See Comments)    Skin turned red      Medication List       This list is accurate as of: 05/06/16 10:39 AM.  Always use your most recent med list.               acetaminophen 325 MG tablet  Commonly known as:  TYLENOL  Take 650 mg by mouth. Every 6 hours as needed     amantadine 100 MG capsule  Commonly known as:  SYMMETREL  Take 100 mg by mouth. Take one tablet daily     baclofen 20 MG tablet  Commonly known as:  LIORESAL  Take 20 mg by mouth 4 (four) times daily. For bladder spasms     bisacodyl 10 MG suppository  Commonly known as:  DULCOLAX  Place 10 mg rectally as needed for moderate constipation.     clonazePAM 0.5 MG tablet  Commonly known as:  KLONOPIN  Take 0.5 mg by mouth. Take one three times daily     [employer] MILK OF MAGNESIA 400 MG/5ML suspension  Generic drug:  magnesium hydroxide  Take by mouth. Take 30ml as needed     dextromethorphan-guaiFENesin 30-600 MG 12hr tablet    Commonly known as:  MUCINEX DM  Take 1 tablet by mouth 2 (two) times daily.     feeding supplement (ENSURE ENLIVE) Liqd  Take 237 mLs by mouth 2 (two) times daily between meals.     flavoxATE 100 MG tablet  Commonly known as:  URISPAS  Take 100 mg by mouth 3 (three) times daily as needed for bladder spasms.     FLUoxetine 20 MG tablet  Commonly known as:  PROZAC  Take 20 mg by mouth daily.     ibuprofen 200 MG tablet  Commonly known as:  ADVIL,MOTRIN  Take 200 mg by mouth. Take 2 tablets every 8 hours as needed     lamoTRIgine 100 MG tablet  Commonly known as:  LAMICTAL  Take 100 mg by mouth at bedtime.     Melatonin 5 MG Tabs  Take by mouth. Take one tablet at bedtime     METAMUCIL FIBER PO  Take by mouth. One tablespoon in 6 oz water or juice daily     multivitamin tablet  Take 1 tablet by mouth daily.     omeprazole 20 MG capsule  Commonly known as:  PRILOSEC  Take 20 mg by mouth. Take one tablet daily     ondansetron 8 MG disintegrating tablet  Commonly known as:  ZOFRAN-ODT  Take 1 tablet (8 mg total) by mouth every 8 (  eight) hours as needed for nausea or vomiting.     oxybutynin 5 MG tablet  Commonly known as:  DITROPAN  Take 5 mg by mouth. Take one tablet four times a day     oxyCODONE 5 MG immediate release tablet  Commonly known as:  Oxy IR/ROXICODONE  Take 5 mg by mouth. Take one tablet every 4 hours as needed for pain     promethazine 12.5 MG tablet  Commonly known as:  PHENERGAN  Take 12.5 mg by mouth every 6 (six) hours as needed for nausea or vomiting.     QUEtiapine 100 MG tablet  Commonly known as:  SEROQUEL  Take 1 tablet (100 mg total) by mouth at bedtime.     senna 8.6 MG Tabs tablet  Commonly known as:  SENOKOT  2 nightly to help constipation     tiZANidine 4 MG capsule  Commonly known as:  ZANAFLEX  Take 4 mg by mouth 2 (two) times daily at 8 am and 10 pm.     warfarin 7.5 MG tablet  Commonly known as:  COUMADIN  8.5mg  daily to  anticoagulate blood to prevent pulmonary emboli. May give as 7.5 mg plus 1 mg tablet daily.        Review of Systems  Constitutional: Negative for fever, chills, diaphoresis, activity change, appetite change, fatigue and unexpected weight change.  HENT: Negative for congestion, ear discharge, ear pain, hearing loss, postnasal drip, rhinorrhea, sore throat, tinnitus, trouble swallowing and voice change.   Eyes: Positive for visual disturbance (corrective lenses). Negative for pain, redness and itching.  Respiratory: Positive for shortness of breath. Negative for cough, choking and wheezing.   Cardiovascular: Positive for leg swelling. Negative for chest pain and palpitations.  Gastrointestinal: Positive for constipation. Negative for nausea, abdominal pain, diarrhea and abdominal distention.       GERD. Episodes of fecal incontinence.  Endocrine: Negative for cold intolerance, heat intolerance, polydipsia, polyphagia and polyuria.  Genitourinary: Negative for dysuria, urgency, frequency, hematuria, flank pain, vaginal discharge, difficulty urinating and pelvic pain.       Suprapubic catheter. History of urethral closure.  Musculoskeletal: Negative for myalgias, back pain, arthralgias, gait problem, neck pain and neck stiffness.       Spastic cerebral palsy with multiple joint contractures  Skin: Negative for color change, pallor and rash.  Allergic/Immunologic: Negative.   Neurological: Negative for dizziness, tremors, seizures, syncope, weakness, numbness and headaches.       Cerebral palsy from birth. Spastic and athetoid movements present.  Hematological: Negative for adenopathy. Does not bruise/bleed easily.       History of anemia  Psychiatric/Behavioral: Positive for sleep disturbance and dysphoric mood. Negative for suicidal ideas, hallucinations, behavioral problems, confusion and agitation. The patient is nervous/anxious. The patient is not hyperactive.        Chronic anxiety     Immunization History  Administered Date(s) Administered  . H1N1 11/04/2008  . Influenza Whole 11/09/2008, 10/05/2009  . Influenza,inj,Quad PF,36+ Mos 10/24/2014  . Influenza-Unspecified 09/13/2015  . Pneumococcal Polysaccharide-23 10/24/2014  . Td 12/02/1994, 03/18/2008   Pertinent  Health Maintenance Due  Topic Date Due  . PAP SMEAR  11/12/2013  . INFLUENZA VACCINE  07/02/2016   Fall Risk  04/02/2016 02/06/2016 12/22/2015  Falls in the past year? Exclusion - non ambulatory Exclusion - non ambulatory Exclusion - non ambulatory   Functional Status Survey:    Filed Vitals:   05/06/16 1033  BP: 135/67  Pulse: 71  Temp: 97.7 F (  36.5 C)  Resp: 14  Height: 4' 2.5" (1.283 m)  Weight: 123 lb (55.792 kg)  SpO2: 95%   Body mass index is 33.89 kg/(m^2). Physical Exam  Constitutional: She is oriented to person, place, and time. She appears well-developed and well-nourished. No distress.  HENT:  Head: Normocephalic and atraumatic.  Right Ear: External ear normal.  Left Ear: External ear normal.  Nose: Nose normal.  Mouth/Throat: Oropharynx is clear and moist. No oropharyngeal exudate.  Eyes: Conjunctivae and EOM are normal. Pupils are equal, round, and reactive to light. No scleral icterus.  Neck: Normal range of motion. Neck supple. No JVD present. No tracheal deviation present. No thyromegaly present.  Cardiovascular: Normal rate, regular rhythm, normal heart sounds and intact distal pulses.  Exam reveals no gallop and no friction rub.   No murmur heard. Pulmonary/Chest: Effort normal and breath sounds normal. No respiratory distress. She has no wheezes. She has no rales. She exhibits no tenderness.  Abdominal: Soft. Bowel sounds are normal. She exhibits no distension and no mass. There is no tenderness.  Suprapubic foley  Genitourinary:  Suprapubic Foley catheter  Musculoskeletal: Normal range of motion. She exhibits edema (trace). She exhibits no tenderness.  Multiple joint  contractures  Lymphadenopathy:    She has no cervical adenopathy.  Neurological: She is alert and oriented to person, place, and time. No cranial nerve deficit. Coordination normal.  Spastic cerebral palsy with athetoid movements  Skin: Skin is warm and dry. No rash noted. She is not diaphoretic. No erythema. No pallor.  Psychiatric: She has a normal mood and affect. Her behavior is normal. Judgment and thought content normal.    Labs reviewed:  Recent Labs  10/09/15 0500 10/09/15 1211  10/24/15 0545  10/28/15 0615 10/29/15 0326  02/16/16 02/23/16 1801 03/15/16  NA  --   --   < >  --   < > 137 140  < > 141 141 140  K  --   --   < >  --   < > 4.5 3.7  < > 4.0 4.1 4.0  CL  --   --   < >  --   < > 102 104  --   --  102  --   CO2  --   --   < >  --   < > 30 29  --   --  27  --   GLUCOSE  --   --   < >  --   < > 117* 100*  --   --  88  --   BUN  --   --   < >  --   < > <5* <5*  < > 6 6 9   CREATININE  --   --   < >  --   < > 0.42* 0.46  < > 0.5 0.43* 0.6  CALCIUM  --   --   < >  --   < > 8.7* 8.8*  --   --  9.1  --   MG 1.6* 2.7*  --  2.0  --   --   --   --   --   --   --   PHOS 2.7  --   --   --   --   --   --   --   --   --   --   < > = values in this interval not displayed.  Recent Labs  10/18/15 1041 10/19/15 0555 10/20/15 0520 11/13/15 02/16/16  AST 20 11* 14* 15 13  ALT 19 16 14 17 17   ALKPHOS 200* 184* 162* 133* 90  BILITOT 0.5 0.7 0.6  --   --   PROT 5.5* 5.4* 5.7*  --   --   ALBUMIN 2.2* 2.2* 2.1*  --   --     Recent Labs  10/19/15 0555  10/23/15 0450  10/28/15 0615 10/29/15 0326 11/13/15 02/16/16 02/23/16 1801  WBC 5.9  < >  --   < > 6.2 6.3 8.2 5.3 6.0  NEUTROABS 3.7  --  9.6*  --   --   --   --   --  3.4  HGB 8.5*  < >  --   < > 9.5* 10.5* 9.7* 11.9* 13.8  HCT 27.6*  < >  --   < > 31.6* 34.1* 32* 37 41.5  MCV 92.0  < >  --   < > 92.4 92.4  --   --  87.0  PLT 356  < >  --   < > 389 342 389 221 291  < > = values in this interval not displayed. Lab Results   Component Value Date   TSH 0.710 06/12/2015   Lab Results  Component Value Date   HGBA1C 6.0 07/25/2009   Lab Results  Component Value Date   CHOL 150 01/29/2007   HDL 41 01/29/2007   LDLCALC 92 01/29/2007   TRIG 83 01/29/2007   CHOLHDL 3.7 Ratio 01/29/2007    Assessment/Plan 1. Constipation, chronic Added Senokot-S back into her laxative regimen 1 tablet daily at bedtime. We will continue with her Metamucil and Dulcolax.  2. Obsessive Compulsive Disorder Is difficult for me to tell whether or not she is seeking "perfection" with her bowel movements.

## 2016-05-07 NOTE — Telephone Encounter (Signed)
May increase tizanidine 4 mg 3 times per day We'll need to see her back in about 2 weeks to see which muscle groups did not respond. Possibly candidate for musculocutaneous nerve block

## 2016-05-07 NOTE — Telephone Encounter (Signed)
Pt has an appointment on 05/14/16 for a follow up to see AK. She states that the Tizanidine makes her sleepy and she is not comfortable taking TID. Please advise?

## 2016-05-07 NOTE — Telephone Encounter (Signed)
Pt states she has not had any relief from the botox injection on 04/02/16. I advised pt that we had to wait to do another injection. Is there anything we can do for her in the meantime before the next injection?

## 2016-05-07 NOTE — Telephone Encounter (Signed)
Not too many options already at maximum dose of baclofen

## 2016-05-08 DIAGNOSIS — R293 Abnormal posture: Secondary | ICD-10-CM | POA: Diagnosis not present

## 2016-05-08 DIAGNOSIS — M24562 Contracture, left knee: Secondary | ICD-10-CM | POA: Diagnosis not present

## 2016-05-08 DIAGNOSIS — R2681 Unsteadiness on feet: Secondary | ICD-10-CM | POA: Diagnosis not present

## 2016-05-08 DIAGNOSIS — A419 Sepsis, unspecified organism: Secondary | ICD-10-CM | POA: Diagnosis not present

## 2016-05-08 DIAGNOSIS — M24561 Contracture, right knee: Secondary | ICD-10-CM | POA: Diagnosis not present

## 2016-05-08 DIAGNOSIS — M6281 Muscle weakness (generalized): Secondary | ICD-10-CM | POA: Diagnosis not present

## 2016-05-09 DIAGNOSIS — F331 Major depressive disorder, recurrent, moderate: Secondary | ICD-10-CM | POA: Diagnosis not present

## 2016-05-13 DIAGNOSIS — R2681 Unsteadiness on feet: Secondary | ICD-10-CM | POA: Diagnosis not present

## 2016-05-13 DIAGNOSIS — R293 Abnormal posture: Secondary | ICD-10-CM | POA: Diagnosis not present

## 2016-05-13 DIAGNOSIS — A419 Sepsis, unspecified organism: Secondary | ICD-10-CM | POA: Diagnosis not present

## 2016-05-13 DIAGNOSIS — M24562 Contracture, left knee: Secondary | ICD-10-CM | POA: Diagnosis not present

## 2016-05-13 DIAGNOSIS — M24561 Contracture, right knee: Secondary | ICD-10-CM | POA: Diagnosis not present

## 2016-05-13 DIAGNOSIS — M6281 Muscle weakness (generalized): Secondary | ICD-10-CM | POA: Diagnosis not present

## 2016-05-14 ENCOUNTER — Ambulatory Visit (HOSPITAL_BASED_OUTPATIENT_CLINIC_OR_DEPARTMENT_OTHER): Payer: Medicare Other | Admitting: Physical Medicine & Rehabilitation

## 2016-05-14 ENCOUNTER — Encounter: Payer: Medicare Other | Attending: Physical Medicine & Rehabilitation

## 2016-05-14 ENCOUNTER — Encounter: Payer: Self-pay | Admitting: Physical Medicine & Rehabilitation

## 2016-05-14 VITALS — BP 106/76 | HR 73 | Resp 14

## 2016-05-14 DIAGNOSIS — N319 Neuromuscular dysfunction of bladder, unspecified: Secondary | ICD-10-CM | POA: Diagnosis not present

## 2016-05-14 DIAGNOSIS — R2681 Unsteadiness on feet: Secondary | ICD-10-CM | POA: Diagnosis not present

## 2016-05-14 DIAGNOSIS — R252 Cramp and spasm: Secondary | ICD-10-CM | POA: Diagnosis not present

## 2016-05-14 DIAGNOSIS — M24562 Contracture, left knee: Secondary | ICD-10-CM | POA: Diagnosis not present

## 2016-05-14 DIAGNOSIS — G249 Dystonia, unspecified: Secondary | ICD-10-CM

## 2016-05-14 DIAGNOSIS — M24561 Contracture, right knee: Secondary | ICD-10-CM | POA: Diagnosis not present

## 2016-05-14 DIAGNOSIS — G808 Other cerebral palsy: Secondary | ICD-10-CM | POA: Diagnosis not present

## 2016-05-14 DIAGNOSIS — A419 Sepsis, unspecified organism: Secondary | ICD-10-CM | POA: Diagnosis not present

## 2016-05-14 DIAGNOSIS — M6281 Muscle weakness (generalized): Secondary | ICD-10-CM | POA: Diagnosis not present

## 2016-05-14 DIAGNOSIS — R293 Abnormal posture: Secondary | ICD-10-CM | POA: Diagnosis not present

## 2016-05-14 NOTE — Progress Notes (Signed)
Subjective:    Patient ID: [name] [name], female    DOB: July 22, 1976, 40 y.o.   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 She is in a motorized wheelchair living at a skilled nursing facility. She uses her left arm to feed herself. She uses the left arm to drive her wheelchair with a joystick. Right upper extremity is used to pull up on a grab bar. No other functional use of the right upper extremity. She has no functional motors in her lower extremities. She has a head rest but her head does not fit in the rest due to severe torticollis toward the right side.  Patient is on a maximum dose of baclofen 20 mg 4 times per day Tizanidine 4 mg makes her drowsy Tizanidine 2 mg is not very effective for this patient. Pain Inventory Average Pain 0 Pain Right Now 0 My pain is no pain  In the last 24 hours, has pain interfered with the following? General activity 0 Relation with others 0 Enjoyment of life 0 What TIME of day is your pain at its worst? no pain Sleep (in general) Poor  Pain is worse with: no pain Pain improves with: no pain Relief from Meds: no pain  Mobility use a wheelchair needs help with transfers  Function disabled: date disabled .  Neuro/Psych trouble walking  Prior Studies Any changes since last visit?  no  Physicians involved in your care Any changes since last visit?  no   Family History  Problem Relation Age of Onset  . Asthma Father   . Colon cancer Maternal Grandmother     Died in her 82's  . Cancer Paternal Grandmother   . Breast cancer Paternal Grandmother     Died in her 84's  . Heart attack Maternal Grandfather     Died in his 41's  . Alzheimer's disease Paternal Grandfather     Died in his 80's   Social History   Social History  . Marital Status: Divorced    Spouse Name: N/A  . Number of Children: 2  . Years of Education: college   Occupational History  .    [name]  UNEMPLOYED    Social History Main Topics  . Smoking status: Never Smoker   . Smokeless tobacco: Never Used  . Alcohol Use: 0.6 oz/week    1 Glasses of wine per week     Comment: Drinks alcohol once a month.  . Drug Use: No  . Sexual Activity: No   Other Topics Concern  . 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    She enjoys shopping.   Lives in a nursing facility,[facility] since 02/11/16      [name] [name], who has schizophrenia, MR, is father of daughter.  [name] [name] and pt are no longer together.  [name] [name] does not see [name] [name]. Daughter is 31 yo, Stage manager, lives with pt.        [name] [name] [phone]), lives with pt's parents.      Needs assistance with ADL's and IADL's--has personal care services 20 hrs/wk;       No tobacco, EtOH, drugs.      **Patient very concerned about her kids being taken from her.      **Case Manager: [name] [name] [phone]      Patient lives [facility]   Past Surgical History  Procedure Laterality  Date  . Colposcopy  06/2000  . Cesarean section      x 2  . Tubal ligation  2003  . Carpal tunnel release  08/2008    Dr Teressa Senter  . Wrist surgery  06/2010    Dr Dierdre Searles, hand surgeon, [name]  . Baclofen pump refill      x 3 times  . Appendectomy    . Intrauterine device insertion  04/30/11    Inserted by [facility] for endometriosis  . Laparoscopically assisted vag hysterectomy  10/27/2012  . Multiple extractions with alveoloplasty Bilateral 07/19/2013    Procedure: EXTRACTIONS #4, 8,11,91,47;  Surgeon: Francene Finders, DDS;  Location: [facility] OR;  Service: Oral Surgery;  Laterality: Bilateral;  . Pain pump implantation N/A 03/07/2014    Procedure: baclofen pump revision/replacement and Catheter connection replacement;  Surgeon: Cristi Loron, MD;  Location: [facility];  Service: Neurosurgery;  Laterality: N/A;  baclofen pump revision/replacement and  Catheter connection replacement  . Programable baclofen pump revision  03/17/14    Battery Replacement   . Cholecystectomy  10/14/2015    Procedure: LAPAROSCOPIC CHOLECYSTECTOMY;  Surgeon: Violeta Gelinas, MD;  Location: [facility] OR;  Service: General;;  . Abdominal hysterectomy    . Neuroma surgery Left     anterior and posterior intersosseous  . Urethra surgery     Past Medical History  Diagnosis Date  . Cerebral palsy ([name])   . Pulmonary embolism ([name])     Lifetime Coumadin  . Cerebral palsy ([name])     Spastic Cerebral palsy, mentally intact  . Contracture, joint, multiple sites     Electric wheelchair, uses left hand to operate chair.   [name] Kitchen Hypertension   . OCD (obsessive compulsive disorder)   . Depression   . Migraine   . [name]   . Dysarthria   . Endometriosis   . History of recurrent UTIs   . GERD (gastroesophageal reflux disease)   . Pulmonary embolism ([name]) 2011, 01/2011    Will be on lifetime coumadin  . Anemia   . Abdominal pain 01/02/2012    Overview:  Overview:  Per Previous pcp note- Dr. Ta--Pt has history of endometriosis and had DUB in 2011.  She She had u/s that showed normal endometrial stripe.  She had endometrial bx that was wnl.  She has a lot of abd cramping every month.  This cramping was sometimes not relieved with hydrocodone.  She was referred to [facility] for IUD placement to help endometriosis and abd cramping.  [name]  . Macroscopic hematuria 03/10/2013    status post replacement of suprapubic tube 03/04/2013   . HYPERTENSION, BENIGN 01/31/2010    Qualifier: Diagnosis of  By: [name], MD, [name] History of endometriosis 10/2012    OBGYN [facility]: Laparoscopic Endometrial Ablation, TVH,  . Gram positive sepsis ([name]) 10/24/2014  . HCAP (healthcare-associated pneumonia) 10/25/2014  . Abdominal pain 01/02/2012    Overview:  Overview:  Per Previous pcp note- Dr. Ta--Pt has history of endometriosis and had DUB in 2011.  She She had u/s  that showed normal endometrial stripe.  She had endometrial bx that was wnl.  She has a lot of abd cramping every month.  This cramping was sometimes not relieved with hydrocodone.  She was referred to [facility] for IUD placement to help endometriosis and abd cramping.  [name] was placed 04/30/11 by [facility].  Pt still complains of abd cramping, which I am treating with  hydrocodone and ultram. Pt reports some initial relief of pain from IUD, but states it has now returned back to similar to previous cramping. Gave patient a shot of Toradol 60 mg IM today-since positive exacerbation of abdominal cramping during past 2 weeks. Physical exam reassuring. Patient may need to [facility] workup since she is plugged in with the GYN providers there for further treatment options regarding endometriosis and abdominal cramping. The dysfunctional uterine bleeding now well contr  . Acute respiratory failure with hypoxia ([name])   . Cellulitis 12/22/2014  . Disease of female genital organs 10/24/2010    Overview:  Overview:  Pt has history of endometriosis and had DUB in 2011.  She had u/s that showed normal endometrial stripe.  She had endometrial bx that was wnl.  She has a lot of abd cramping every month.  This cramping was sometimes not relieved with hydrocodone.  She was referred to [facility] for IUD placement to help endometriosis and abd cramping.  [name].  Pt still complains of abd cramping, which was treated with hydrocodone and ultram.   [facility] by OB/GYN 12/24/11, given Depo Lupron injection.  Ordering CT scan to r/o additional cause of abdominal pain (pt with known endometriosis and will not tolerate vaginal U/S, pain not improved s/p [name] or with Lupron injection-- only caused hot flashes and amenorrhea, no pain relief).  Also made referral to pain clinic for better pain control.  Do not think hysterectomy would help with pain, and concern for  contamination of baclofen pump if did proceed with surgery.    8/5/13[name]: [facility]: Pt was seen by   . Dyspnea and respiratory abnormality 02/04/2011    Overview:  Overview:  Pt has history of recurrent PE and is on chronic coumadin.  Pt has been complaining of dyspnea that she describes as chest tightness.  She has been to the ER multiple times for this.  Usually she gets CTA and serial CE.  She has had at least 6 CTA in a period of since 2011.  She also has an O2 requirement. It is unclear why pt feels dyspneic, complains of chest tightness, or has O2 requirement.  I thought that her complaint may be from deconditioning and referred her to PT.  Unfortunately Medicaid only pays for 4 PT visits.  Pt should be doing these exercises at home, but she has not.  [facility], and she has been seen by Dr Marchelle Gearingamaswamy.  He did not understand the etiology of her dyspnea and O2 requirement.  He will continue to follow her.   Last Assessment & Plan:  Pt presents with concerns for chest tightness and dyspnea.  She was seen in ED yesterday and discharged home.  While there she had a CT chest 04/20/11 showed 1.  Multifocal degradation as detailed above.  N  . Gross hematuria 03/12/2013    Overview:  Overview:  status post replacement of suprapubic tube 03/04/2013  Last Assessment & Plan:  Recent urine culture collected in ED on 5/4 did not result in significant growth. Moreover, patient has been asymptomatic with normal WBC, no fever and no true symptoms making symptomatic UTI less likely.  Will continue to monitor for any sign of infection.  Patient's xarelto had been held during her recent hospitalization due to hematuria. This has since been re-started. Monitor bleeding.    [name] Kitchen. History of anticoagulant therapy 10/21/2012  . Infantile  cerebral palsy ([name]) 01/29/2007    Overview:  She has spastic CP.  She is mentally intact.  She is wheelchair bound.  She is wheelchair bound for ambulation      . Intracervical  pessary 05/07/2011    Overview:  Overview:  Placed by [facility] 04/30/11 to treat DUB and Endometriosis.  She is seen at GYN clinic at [facility] but was considered a poor surgical candidate and referred her to [facility].  For DUB she had pelvic ultrasound that showed thin stripe and she had endometrial Bx by Dr Jennette Kettle in GYN clinic, which was negative.     . Parapneumonic effusion 10/25/2014  . Presence of intrathecal baclofen pump 03/07/2014    R LQ. Insertion T10.    [name] Kitchen Psychiatric illness 10/31/2014  . Recurrent pulmonary embolism ([name]) 05/03/2011    Pt has history of recurrent PE and is on chronic coumadin.  Pt has been complaining of dyspnea that she describes as chest tightness.  She has been to the ER multiple times for this.  Usually she gets CTA and serial CE.  She has had at least 6 CTA in a period of since 2011.  She also has an O2 requirement. It is unclear why pt feels dyspneic, complains of chest tightness, or has O2 requirement.  I thought that her complaint may be from deconditioning and referred her to PT.  Unfortunately Medicaid only pays for 4 PT visits.  Pt should be doing these exercises at home, but she has not.  I have referred her to [facility], and she has been seen by [name].  He did not understand the etiology of her dyspnea and O2 requirement.  He will continue to follow her.     . Seborrheic keratosis 01/18/2011  . Spasticity 01/05/2014  . Transient alteration of awareness, recurrent 09/08/2006    Recurrent episodes where Hamda  loses contact with her world and that have been studied thoroughly and appear nonepileptic in nature per Dr Terrace Arabia (neurology) The patient has had episodes starting in 2007 that initially began 1-4 times a day during which time she would have periods of unawareness followed by confusion. This continuous video EEG monitoring performed from October 8-12, 2007. Had   . Acute cystitis without hematuria   . Acute cystitis with hematuria   . Urethra dilated and  patulous 07/03/2015    Patulous, Dilated Urethra. 5mL balloon on a 22-Fr catheter hoping this would prevent the bladder spasm from pushing the balloon down the urethra (Dr Logan Bores, Urology [facility], July 2016)   . Seizures ([name]) 02/12/2016  . Incontinent of feces 03/11/2016   BP 106/76 mmHg  Pulse 73  Resp 14  SpO2 94%  LMP 04/05/2011  Opioid Risk Score:   Fall Risk Score:  `1  Depression screen PHQ 2/9  Depression screen PHQ 2/9 01/08/2016  Decreased Interest 0  Down, Depressed, Hopeless 0  PHQ - 2 Score 0     Review of Systems  All other systems reviewed and are negative.      Objective:   Physical Exam  Ashworth grade 3 in the right biceps Ashworth grade 4 at the right wrist flexor and finger flexors Ashworth grade 3 at the left thumb flexor and left finger flexors Ashworth grade 2 at the left elbow flexors  Patient is in external rotation of the right arm. Bringing the elbow forward causes increased elbow flexion Head is tilted toward the right.  Right lateral collis  severe  Assessment & Plan:  1. Cervical palsy with spastic quadriparesis also has cervical dystonia.  Patient had some minor reductions of spasticity with the last Botox injection 400 units. She still uses her left upper extremity functionally for the joystick. She does pull up with her right hand on a grab bar as well.  She is unable to place her head and had rest very well due to the severe lateral Collis  Plan for musculocutaneous nerve block next visit to address biceps spasticity on the right side  Following this will be a Botox injection a total of 400 units  75 units in the right splenius capitis 75 units in the right trapezius 50 units in the right sternocleidomastoid  50 units right FCR 50 units right FCU 50 units right FDS 50 units right FDP

## 2016-05-15 DIAGNOSIS — I2699 Other pulmonary embolism without acute cor pulmonale: Secondary | ICD-10-CM | POA: Diagnosis not present

## 2016-05-15 DIAGNOSIS — F428 Other obsessive-compulsive disorder: Secondary | ICD-10-CM | POA: Diagnosis not present

## 2016-05-15 DIAGNOSIS — K219 Gastro-esophageal reflux disease without esophagitis: Secondary | ICD-10-CM | POA: Diagnosis not present

## 2016-05-16 ENCOUNTER — Other Ambulatory Visit: Payer: Self-pay | Admitting: *Deleted

## 2016-05-16 MED ORDER — OXYCODONE HCL 5 MG PO TABS: ORAL_TABLET | ORAL | Status: DC

## 2016-05-16 NOTE — Telephone Encounter (Signed)
[facility] 

## 2016-05-16 NOTE — Telephone Encounter (Signed)
DISREGARD--Printed by mistake--we no longer service [facility].

## 2016-05-27 ENCOUNTER — Ambulatory Visit: Payer: Medicare Other | Admitting: Physical Medicine & Rehabilitation

## 2016-05-30 ENCOUNTER — Ambulatory Visit (INDEPENDENT_AMBULATORY_CARE_PROVIDER_SITE_OTHER): Payer: Medicare Other | Admitting: Pediatrics

## 2016-05-30 ENCOUNTER — Encounter: Payer: Self-pay | Admitting: Pediatrics

## 2016-05-30 DIAGNOSIS — G808 Other cerebral palsy: Secondary | ICD-10-CM

## 2016-05-30 NOTE — Progress Notes (Signed)
Patient: [name] [name] MRN: [id number] Sex: female DOB: [birth year]  Provider: Deetta Perla, MD Location of Care: [facility] Child Neurology  Note type: Routine return visit  History of Present Illness: Referral Source: Melanie March, MD History from: patient and [facility] chart Chief Complaint: Spastic/Athetoid Quadriparesis/Baclofen Pump Refill  [name] [name] is a 40 y.o. female who returns for evaluation and management of spastic quadriparesis, and emptying, refilling, and reprogramming her intrathecal baclofen pump.  Procedure: emptying, refilling, and reprogramming her intrathecal baclofen pump.  The device was interrogated and showed a complex continuous infusion of baclofen that has a basal rate of 16.2 mcg per hour. The patient receives bolus doses of 27 mcg delivered at midnight, 6 AM, 12 noon, and 6 PM, for total of 465.8 mcg per day.   The patient was sterilely prepped and draped. A 1-1/2 inch 22-gauge noncoring Huebner needle was inserted On the first pass. The apex of the reservoir is going horizontally to the left. 4.8 mL was withdrawn from the pump placing it under partial vacuum. 40 mL of baclofen (concentration 2000 mcg/mL) was instilled into the pump through a Millipore filter. She tolerated the procedure well.  Her reservoir alarm date is November 09, 2016, or 163 days from now. ERI 54 months.  Review of Systems: 12 system review was assessed and was negative  Past Medical History Diagnosis Date  . Cerebral palsy ([name])   . Pulmonary embolism ([name])     Lifetime Coumadin  . Cerebral palsy ([name])     Spastic Cerebral palsy, mentally intact  . Contracture, joint, multiple sites     Electric wheelchair, uses left hand to operate chair.   [name] Kitchen Hypertension   . OCD (obsessive compulsive disorder)   . Depression   . Migraine   . [name]   . Dysarthria   . Endometriosis   . History of recurrent UTIs   . GERD (gastroesophageal reflux  disease)   . Pulmonary embolism ([name]) 2011, 01/2011    Will be on lifetime coumadin  . Anemia   . Abdominal pain 01/02/2012    Overview:  Overview:  Per Previous pcp note- Dr. Ta--Pt has history of endometriosis and had DUB in 2011.  She She had u/s that showed normal endometrial stripe.  She had endometrial bx that was wnl.  She has a lot of abd cramping every month.  This cramping was sometimes not relieved with hydrocodone.  She was referred to [facility] for IUD placement to help endometriosis and abd cramping.  [name]  . Macroscopic hematuria 03/10/2013    status post replacement of suprapubic tube 03/04/2013   . HYPERTENSION, BENIGN 01/31/2010    Qualifier: Diagnosis of  By: [name], MD, [name] History of endometriosis 10/2012    OBGYN [facility]: Laparoscopic Endometrial Ablation, TVH,  . Gram positive sepsis ([name]) 10/24/2014  . HCAP (healthcare-associated pneumonia) 10/25/2014  . Abdominal pain 01/02/2012    Overview:  Overview:  Per Previous pcp note- Dr. Ta--Pt has history of endometriosis and had DUB in 2011.  She She had u/s that showed normal endometrial stripe.  She had endometrial bx that was wnl.  She has a lot of abd cramping every month.  This cramping was sometimes not relieved with hydrocodone.  She was referred to [facility] for IUD placement to help endometriosis and abd cramping.  [name] was placed 04/30/11 by [facility].  Pt still complains of abd cramping, which I am treating with hydrocodone and ultram.  Pt reports some initial relief of pain from IUD, but states it has now returned back to similar to previous cramping. Gave patient a shot of Toradol 60 mg IM today-since positive exacerbation of abdominal cramping during past 2 weeks. Physical exam reassuring. Patient may need to return to [facility] for further workup since she is plugged in with the GYN providers there for further treatment options regarding endometriosis and abdominal cramping. The dysfunctional uterine bleeding  now well contr  . Acute respiratory failure with hypoxia ([name])   . Cellulitis 12/22/2014  . Disease of female genital organs 10/24/2010    Overview:  Overview:  Pt has history of endometriosis and had DUB in 2011.  She had u/s that showed normal endometrial stripe.  She had endometrial bx that was wnl.  She has a lot of abd cramping every month.  This cramping was sometimes not relieved with hydrocodone.  She was referred to [facility] for IUD placement to help endometriosis and abd cramping.  [name] was placed 04/30/11 by [facility].  Pt still complains of abd cramping, which was treated with hydrocodone and ultram.   Last seen at [facility] by OB/GYN 12/24/11, given Depo Lupron injection.  Ordering CT scan to r/o additional cause of abdominal pain (pt with known endometriosis and will not tolerate vaginal U/S, pain not improved s/p [name] or with Lupron injection-- only caused hot flashes and amenorrhea, no pain relief).  Also made referral to pain clinic for better pain control.  Do not think hysterectomy would help with pain, and concern for contamination of baclofen pump if did proceed with surgery.    8/5/13[name] OB/[facility]: Pt was seen by   . Dyspnea and respiratory abnormality 02/04/2011    Overview:  Overview:  Pt has history of recurrent PE and is on chronic coumadin.  Pt has been complaining of dyspnea that she describes as chest tightness.  She has been to the ER multiple times for this.  Usually she gets CTA and serial CE.  She has had at least 6 CTA in a period of since 2011.  She also has an O2 requirement. It is unclear why pt feels dyspneic, complains of chest tightness, or has O2 requirement.  I thought that her complaint may be from deconditioning and referred her to PT.  Unfortunately Medicaid only pays for 4 PT visits.  Pt should be doing these exercises at home, but she has not.  I have referred her to [facility], and she has been seen by [name].  He did not understand the etiology of her dyspnea  and O2 requirement.  He will continue to follow her.   Last Assessment & Plan:  Pt presents with concerns for chest tightness and dyspnea.  She was seen in ED yesterday and discharged home.  While there she had a CT chest 04/20/11 showed 1.  Multifocal degradation as detailed above.  N  . Gross hematuria 03/12/2013    Overview:  Overview:  status post replacement of suprapubic tube 03/04/2013  Last Assessment & Plan:  Recent urine culture collected in ED on 5/4 did not result in significant growth. Moreover, patient has been asymptomatic with normal WBC, no fever and no true symptoms making symptomatic UTI less likely.  Will continue to monitor for any sign of infection.  Patient's xarelto had been held during her recent hospitalization due to hematuria. This has since been re-started. Monitor bleeding.    [name] Kitchen History of anticoagulant therapy 10/21/2012  . Infantile cerebral palsy (  [name]) 01/29/2007    Overview:  She has spastic CP.  She is mentally intact.  She is wheelchair bound.  She is wheelchair bound for ambulation      . Intracervical pessary 05/07/2011    Overview:  Overview:  Placed by [facility] GYN 04/30/11 to treat DUB and Endometriosis.  She is seen at GYN clinic at [facility] but was considered a poor surgical candidate and referred her to [facility].  For DUB she had pelvic ultrasound that showed thin stripe and she had endometrial Bx by Dr Jennette Kettle in GYN clinic, which was negative.     . Parapneumonic effusion 10/25/2014  . Presence of intrathecal baclofen pump 03/07/2014    R LQ. Insertion T10.    [name] Kitchen Psychiatric illness 10/31/2014  . Recurrent pulmonary embolism ([name]) 05/03/2011    Pt has history of recurrent PE and is on chronic coumadin.  Pt has been complaining of dyspnea that she describes as chest tightness.  She has been to the ER multiple times for this.  Usually she gets CTA and serial CE.  She has had at least 6 CTA in a period of since 2011.  She also has an O2 requirement. It is unclear why pt feels  dyspneic, complains of chest tightness, or has O2 requirement.  I thought that her complaint may be from deconditioning and referred her to PT.  Unfortunately Medicaid only pays for 4 PT visits.  Pt should be doing these exercises at home, but she has not.  I have referred her to [facility], and she has been seen by [name].  He did not understand the etiology of her dyspnea and O2 requirement.  He will continue to follow her.     . Seborrheic keratosis 01/18/2011  . Spasticity 01/05/2014  . Transient alteration of awareness, recurrent 09/08/2006    Recurrent episodes where Livian  loses contact with her world and that have been studied thoroughly and appear nonepileptic in nature per Dr Terrace Arabia (neurology) The patient has had episodes starting in 2007 that initially began 1-4 times a day during which time she would have periods of unawareness followed by confusion. This continuous video EEG monitoring performed from October 8-12, 2007. Had   . Acute cystitis without hematuria   . Acute cystitis with hematuria   . Urethra dilated and patulous 07/03/2015    Patulous, Dilated Urethra. 5mL balloon on a 22-Fr catheter hoping this would prevent the bladder spasm from pushing the balloon down the urethra (Dr Logan Bores, Urology [facility], July 2016)   . Seizures ([name]) 02/12/2016  . Incontinent of feces 03/11/2016   Hospitalizations: Yes.  , Head Injury: No., Nervous System Infections: No., Immunizations up to date: Yes.    Birth History Extremely premature infant  Behavior History anxiety, depression, nonepileptic loss of consciousness  Surgical History Procedure Laterality Date  . Colposcopy  06/2000  . Cesarean section      x 2  . Tubal ligation  2003  . Carpal tunnel release  08/2008    Dr Teressa Senter  . Wrist surgery  06/2010    Dr Dierdre Searles, hand surgeon, [name]  . Baclofen pump refill      x 3 times  . Appendectomy    . Intrauterine device insertion  04/30/11    Inserted by [facility] GYN for endometriosis  .  Laparoscopically assisted vag hysterectomy  10/27/2012  . Multiple extractions with alveoloplasty Bilateral 07/19/2013    Procedure: EXTRACTIONS #4, 1,61,09,60;  Surgeon: Francene Finders, DDS;  Location: [facility] OR;  Service: Oral Surgery;  Laterality: Bilateral;  . Pain pump implantation N/A 03/07/2014    Procedure: baclofen pump revision/replacement and Catheter connection replacement;  Surgeon: Cristi LoronJeffrey D Jenkins, MD;  Location: [facility] NEURO ORS;  Service: Neurosurgery;  Laterality: N/A;  baclofen pump revision/replacement and Catheter connection replacement  . Programable baclofen pump revision  03/17/14    Battery Replacement   . Cholecystectomy  10/14/2015    Procedure: LAPAROSCOPIC CHOLECYSTECTOMY;  Surgeon: Violeta GelinasBurke Thompson, MD;  [facility];  Service: General;;  . Abdominal hysterectomy    . Neuroma surgery Left     anterior and posterior intersosseous  . Urethra surgery     Family History family history includes Alzheimer's disease in her paternal grandfather; Asthma in her father; Breast cancer in her paternal grandmother; Cancer in her paternal grandmother; Colon cancer in her maternal grandmother; Heart attack in her maternal grandfather. Family history is negative for migraines, seizures, intellectual disabilities, blindness, deafness, birth defects, chromosomal disorder, or autism.  Social History . Marital Status: Divorced    Spouse Name: N/A  . Number of Children: 2  . Years of Education: college   Occupational History  . UNEMPLOYED    Social History Main Topics  . Smoking status: Never Smoker   . Smokeless tobacco: Never Used  . Alcohol Use: 0.6 oz/week    1 Glasses of wine per week     Comment: Drinks alcohol once a month.  . Drug Use: No  . Sexual Activity: No   Social History Narrative    [name] graduated from college with an Scientist, research (physical sciences)Associates Degree in Surveyor, mineralsCriminal Justice.     She enjoys shopping.    Lives in a nursing facility[facility] since 02/11/[facility]    Ailene ArdsFrank Haith, who has schizophrenia, MR, is father of daughter.  Homero FellersFrank and pt are no longer together.  Homero FellersFrank does not see Norberta KeensKiarra. Daughter is 708 yo, Stage managerKiarra Haith, lives with pt.    Maisie Fushomas [phone](1977), lives with pt's parents.    Needs assistance with ADL's and IADL's--has personal care services 20 hrs/wk;     No tobacco, EtOH, drugs.    **Patient very concerned about her kids being taken from her.    **Case Manager: Jack Quartosabelle Culler [phone]    [facility]   Allergies Allergen Reactions  . Morphine Dermatitis and Other (See Comments)    Skin turned red   Physical Exam LMP 04/05/2011  Not examined today  Assessment 1. Athetoid cervical palsy, G80.3. 2. Dystonia, G24.9.  Plan Successfully emptied refilled and reprogrammed intrathecal baclofen pump. She will return before June 06, 2016 empty, refill, and reprogram her pump.   Medication List   This list is accurate as of: 05/30/16 10:23 AM.       acetaminophen 325 MG tablet  Commonly known as:  TYLENOL  Take 650 mg by mouth. Every 6 hours as needed     amantadine 100 MG capsule  Commonly known as:  SYMMETREL  Take 100 mg by mouth. Take one tablet daily     baclofen 20 MG tablet  Commonly known as:  LIORESAL  Take 20 mg by mouth 4 (four) times daily. For bladder spasms     bisacodyl 10 MG suppository  Commonly known as:  DULCOLAX  Place 10 mg rectally as needed for moderate constipation.     clonazePAM 0.5 MG tablet  Commonly known as:  KLONOPIN  Take 0.5 mg by mouth. Take one three times daily  [employer] MILK OF MAGNESIA 400 MG/5ML suspension  Generic drug:  magnesium hydroxide  Take by mouth. Take 30ml as needed     dextromethorphan-guaiFENesin 30-600 MG 12hr tablet  Commonly known as:  MUCINEX DM  Take 1 tablet by mouth 2 (two) times daily.     feeding supplement (ENSURE ENLIVE) Liqd  Take 237 mLs by mouth 2 (two) times daily between meals.     flavoxATE 100 MG tablet    Commonly known as:  URISPAS  Take 100 mg by mouth 3 (three) times daily as needed for bladder spasms.     FLUoxetine 20 MG tablet  Commonly known as:  PROZAC  Take 20 mg by mouth daily.     ibuprofen 200 MG tablet  Commonly known as:  ADVIL,MOTRIN  Take 200 mg by mouth. Take 2 tablets every 8 hours as needed     lamoTRIgine 100 MG tablet  Commonly known as:  LAMICTAL  Take 100 mg by mouth at bedtime.     Melatonin 5 MG Tabs  Take by mouth. Take one tablet at bedtime     METAMUCIL FIBER PO  Take by mouth. One tablespoon in 6 oz water or juice daily     multivitamin tablet  Take 1 tablet by mouth daily.     omeprazole 20 MG capsule  Commonly known as:  PRILOSEC  Take 20 mg by mouth. Take one tablet daily     ondansetron 8 MG disintegrating tablet  Commonly known as:  ZOFRAN-ODT  Take 1 tablet (8 mg total) by mouth every 8 (eight) hours as needed for nausea or vomiting.     oxybutynin 5 MG tablet  Commonly known as:  DITROPAN  Take 5 mg by mouth. Take one tablet four times a day     oxyCODONE 5 MG immediate release tablet  Commonly known as:  ROXICODONE  Take one tablet by mouth every 4 hours as needed for pain     promethazine 12.5 MG tablet  Commonly known as:  PHENERGAN  Take 12.5 mg by mouth every 6 (six) hours as needed for nausea or vomiting.     QUEtiapine 100 MG tablet  Commonly known as:  SEROQUEL  Take 1 tablet (100 mg total) by mouth at bedtime.     senna 8.6 MG Tabs tablet  Commonly known as:  SENOKOT  2 nightly to help constipation     tiZANidine 4 MG capsule  Commonly known as:  ZANAFLEX  Take 4 mg by mouth 2 (two) times daily at 8 am and 10 pm.     warfarin 7.5 MG tablet  Commonly known as:  COUMADIN  8.5mg  daily to anticoagulate blood to prevent pulmonary emboli. May give as 7.5 mg plus 1 mg tablet daily.      The medication list was reviewed and reconciled. All changes or newly prescribed medications were explained.  A complete medication  list was provided to the patient/caregiver.  Deetta PerlaWilliam H Hickling MD

## 2016-05-31 DIAGNOSIS — K5909 Other constipation: Secondary | ICD-10-CM | POA: Diagnosis not present

## 2016-06-18 DIAGNOSIS — Z9359 Other cystostomy status: Secondary | ICD-10-CM | POA: Diagnosis not present

## 2016-06-28 DIAGNOSIS — R42 Dizziness and giddiness: Secondary | ICD-10-CM | POA: Diagnosis not present

## 2016-07-01 DIAGNOSIS — R42 Dizziness and giddiness: Secondary | ICD-10-CM | POA: Diagnosis not present

## 2016-07-03 DIAGNOSIS — R278 Other lack of coordination: Secondary | ICD-10-CM | POA: Diagnosis not present

## 2016-07-03 DIAGNOSIS — R293 Abnormal posture: Secondary | ICD-10-CM | POA: Diagnosis not present

## 2016-07-03 DIAGNOSIS — A419 Sepsis, unspecified organism: Secondary | ICD-10-CM | POA: Diagnosis not present

## 2016-07-03 DIAGNOSIS — M6281 Muscle weakness (generalized): Secondary | ICD-10-CM | POA: Diagnosis not present

## 2016-07-03 DIAGNOSIS — R41841 Cognitive communication deficit: Secondary | ICD-10-CM | POA: Diagnosis not present

## 2016-07-03 DIAGNOSIS — R2681 Unsteadiness on feet: Secondary | ICD-10-CM | POA: Diagnosis not present

## 2016-07-03 DIAGNOSIS — G809 Cerebral palsy, unspecified: Secondary | ICD-10-CM | POA: Diagnosis not present

## 2016-07-04 ENCOUNTER — Telehealth: Payer: Self-pay | Admitting: Family

## 2016-07-04 DIAGNOSIS — G809 Cerebral palsy, unspecified: Secondary | ICD-10-CM | POA: Diagnosis not present

## 2016-07-04 DIAGNOSIS — R293 Abnormal posture: Secondary | ICD-10-CM | POA: Diagnosis not present

## 2016-07-04 DIAGNOSIS — A419 Sepsis, unspecified organism: Secondary | ICD-10-CM | POA: Diagnosis not present

## 2016-07-04 DIAGNOSIS — R278 Other lack of coordination: Secondary | ICD-10-CM | POA: Diagnosis not present

## 2016-07-04 DIAGNOSIS — M6281 Muscle weakness (generalized): Secondary | ICD-10-CM | POA: Diagnosis not present

## 2016-07-04 DIAGNOSIS — R2681 Unsteadiness on feet: Secondary | ICD-10-CM | POA: Diagnosis not present

## 2016-07-04 NOTE — Telephone Encounter (Signed)
[name] left a message saying that she was having problems with her legs. I called her back and left a message on her voicemail asking her to call me back. TG

## 2016-07-05 DIAGNOSIS — G809 Cerebral palsy, unspecified: Secondary | ICD-10-CM | POA: Diagnosis not present

## 2016-07-05 DIAGNOSIS — A419 Sepsis, unspecified organism: Secondary | ICD-10-CM | POA: Diagnosis not present

## 2016-07-05 DIAGNOSIS — R2681 Unsteadiness on feet: Secondary | ICD-10-CM | POA: Diagnosis not present

## 2016-07-05 DIAGNOSIS — R278 Other lack of coordination: Secondary | ICD-10-CM | POA: Diagnosis not present

## 2016-07-05 DIAGNOSIS — R293 Abnormal posture: Secondary | ICD-10-CM | POA: Diagnosis not present

## 2016-07-05 DIAGNOSIS — M6281 Muscle weakness (generalized): Secondary | ICD-10-CM | POA: Diagnosis not present

## 2016-07-05 NOTE — Telephone Encounter (Signed)
I tried to call [name] [name] again today but received a message saying that her phone was not accepting incoming calls. I will wait to see if she calls again. TG

## 2016-07-08 ENCOUNTER — Encounter: Payer: Self-pay | Admitting: Physical Medicine & Rehabilitation

## 2016-07-08 ENCOUNTER — Ambulatory Visit (HOSPITAL_BASED_OUTPATIENT_CLINIC_OR_DEPARTMENT_OTHER): Payer: Medicare Other | Admitting: Physical Medicine & Rehabilitation

## 2016-07-08 ENCOUNTER — Encounter: Payer: Medicare Other | Attending: Physical Medicine & Rehabilitation

## 2016-07-08 VITALS — BP 112/80 | HR 78

## 2016-07-08 DIAGNOSIS — A419 Sepsis, unspecified organism: Secondary | ICD-10-CM | POA: Diagnosis not present

## 2016-07-08 DIAGNOSIS — G248 Other dystonia: Secondary | ICD-10-CM

## 2016-07-08 DIAGNOSIS — M6281 Muscle weakness (generalized): Secondary | ICD-10-CM | POA: Diagnosis not present

## 2016-07-08 DIAGNOSIS — G801 Spastic diplegic cerebral palsy: Secondary | ICD-10-CM | POA: Diagnosis not present

## 2016-07-08 DIAGNOSIS — R252 Cramp and spasm: Secondary | ICD-10-CM | POA: Diagnosis not present

## 2016-07-08 DIAGNOSIS — G809 Cerebral palsy, unspecified: Secondary | ICD-10-CM | POA: Diagnosis not present

## 2016-07-08 DIAGNOSIS — R2681 Unsteadiness on feet: Secondary | ICD-10-CM | POA: Diagnosis not present

## 2016-07-08 DIAGNOSIS — R293 Abnormal posture: Secondary | ICD-10-CM | POA: Diagnosis not present

## 2016-07-08 DIAGNOSIS — R278 Other lack of coordination: Secondary | ICD-10-CM | POA: Diagnosis not present

## 2016-07-08 NOTE — Telephone Encounter (Addendum)
I called [name] [name] back. She said that she had been working on standing and trying to take steps, and trying to do some transfers from bed to chair on her own. She said that her legs were very spastic and that she wanted the Baclofen pump dose increased so that she would be more limber and able to walk. I explained to [name] [name] that the spasticity on her legs was needed to help her to be able to stand and bear weight and that if the legs were more relaxed from the Baclofen that she may lose the ability to do that. [name] [name] argued and said that her legs and feet would not bend, and that her feet "would not move" when she was trying to stand and take steps, and felt that a Baclofen dose increase would help. She said that she didn't think that her legs weren't worse than when she was seen in June for pump refill but that since she was trying to stand and do transfers that she felt that the spasticity was hindering her efforts. I explained to her that it was important for her to continue to try to bear weight but that a higher dose of Baclofen may reduce the strength in her legs that the spasticity is proving. [name] [name] continued to argue that she needs an increased amount of Baclofen. I told her that [name] [name] was out of the office today but that I would talk with him tomorrow and call her back at that time with his recommendations. She agreed with this plan. TG

## 2016-07-08 NOTE — Progress Notes (Signed)
Botox Injection for spasticity using needle EMG guidance  Dilution: 50 Units/ml Indication: Severe spasticity which interferes with ADL,mobility and/or  hygiene and is unresponsive to medication management and other conservative care Informed consent was obtained after describing risks and benefits of the procedure with the patient. This includes bleeding, bruising, infection, excessive weakness, or medication side effects. A REMS form is on file and signed. Needle: 27g 1" needle electrode Number of units per muscle Pectoralis0 Biceps50 FCR50 FCU0 FDS50 FDP50  All injections were done after obtaining appropriate EMG activity and after negative drawback for blood. The patient tolerated the procedure well. Post procedure instructions were given. A followup appointment was made.

## 2016-07-08 NOTE — Progress Notes (Signed)
Botulinum toxin injection for cervical dystonia G 24.3. Patient with lateral Collis. This affects wheelchair positioning, her head tilts to the right  Informed consent was obtained after describing risks and benefits of the procedure. The patient is include bleeding, bruising, infection, as well as swallowing issues. She elects to proceed.  75 units in the right splenius capitis 75 units in the right trapezius 50 units in the right sternocleidomastoid, upper aspect  Patient tolerated procedure well. Post procedure instructions given

## 2016-07-08 NOTE — Telephone Encounter (Signed)
[name] lvm asking for a return call at : [phone].

## 2016-07-08 NOTE — Telephone Encounter (Signed)
[name] HongJudy has not called back. TG

## 2016-07-08 NOTE — Patient Instructions (Signed)
Please inform us if developing any swallowing problems. You received a Botox injection today. You may experience soreness at the needle injection sites. Please call us if any of the injection sites turns red after a couple days or if there is any drainage. You may experience muscle weakness as a result of Botox. This would improve with time but can take several weeks to improve. The Botox should start working in about one week. The Botox usually last 3 months. The injection can be repeated every 3 months as needed.

## 2016-07-09 DIAGNOSIS — R293 Abnormal posture: Secondary | ICD-10-CM | POA: Diagnosis not present

## 2016-07-09 DIAGNOSIS — R2681 Unsteadiness on feet: Secondary | ICD-10-CM | POA: Diagnosis not present

## 2016-07-09 DIAGNOSIS — R278 Other lack of coordination: Secondary | ICD-10-CM | POA: Diagnosis not present

## 2016-07-09 DIAGNOSIS — A419 Sepsis, unspecified organism: Secondary | ICD-10-CM | POA: Diagnosis not present

## 2016-07-09 DIAGNOSIS — M6281 Muscle weakness (generalized): Secondary | ICD-10-CM | POA: Diagnosis not present

## 2016-07-09 DIAGNOSIS — G809 Cerebral palsy, unspecified: Secondary | ICD-10-CM | POA: Diagnosis not present

## 2016-07-09 NOTE — Telephone Encounter (Signed)
I called [name] and told her that Dr Sharene SkeansHickling wanted her to come in to the office to be evaluated. She accepted an appointment Thursday August 10th at 2:00PM. I will consult with Dr Sharene SkeansHickling at that time. TG

## 2016-07-10 ENCOUNTER — Other Ambulatory Visit: Payer: Self-pay | Admitting: *Deleted

## 2016-07-10 DIAGNOSIS — R2681 Unsteadiness on feet: Secondary | ICD-10-CM | POA: Diagnosis not present

## 2016-07-10 DIAGNOSIS — R278 Other lack of coordination: Secondary | ICD-10-CM | POA: Diagnosis not present

## 2016-07-10 DIAGNOSIS — M6281 Muscle weakness (generalized): Secondary | ICD-10-CM | POA: Diagnosis not present

## 2016-07-10 DIAGNOSIS — R293 Abnormal posture: Secondary | ICD-10-CM | POA: Diagnosis not present

## 2016-07-10 DIAGNOSIS — A419 Sepsis, unspecified organism: Secondary | ICD-10-CM | POA: Diagnosis not present

## 2016-07-10 DIAGNOSIS — G809 Cerebral palsy, unspecified: Secondary | ICD-10-CM | POA: Diagnosis not present

## 2016-07-10 MED ORDER — OXYCODONE HCL 5 MG PO TABS: ORAL_TABLET | ORAL | 0 refills | Status: DC

## 2016-07-10 NOTE — Telephone Encounter (Signed)
Disregard Rx. Rx shredded due to [facility].

## 2016-07-11 ENCOUNTER — Ambulatory Visit (INDEPENDENT_AMBULATORY_CARE_PROVIDER_SITE_OTHER): Payer: Medicare Other | Admitting: Family

## 2016-07-11 ENCOUNTER — Encounter: Payer: Self-pay | Admitting: Family

## 2016-07-11 VITALS — BP 130/70 | HR 84 | Ht <= 58 in | Wt 116.0 lb

## 2016-07-11 DIAGNOSIS — G809 Cerebral palsy, unspecified: Secondary | ICD-10-CM | POA: Diagnosis not present

## 2016-07-11 DIAGNOSIS — R278 Other lack of coordination: Secondary | ICD-10-CM | POA: Diagnosis not present

## 2016-07-11 DIAGNOSIS — A419 Sepsis, unspecified organism: Secondary | ICD-10-CM | POA: Diagnosis not present

## 2016-07-11 DIAGNOSIS — G803 Athetoid cerebral palsy: Secondary | ICD-10-CM

## 2016-07-11 DIAGNOSIS — R2681 Unsteadiness on feet: Secondary | ICD-10-CM | POA: Diagnosis not present

## 2016-07-11 DIAGNOSIS — G249 Dystonia, unspecified: Secondary | ICD-10-CM

## 2016-07-11 DIAGNOSIS — R293 Abnormal posture: Secondary | ICD-10-CM | POA: Diagnosis not present

## 2016-07-11 DIAGNOSIS — M6281 Muscle weakness (generalized): Secondary | ICD-10-CM | POA: Diagnosis not present

## 2016-07-11 NOTE — Progress Notes (Signed)
Patient: [name] [name] MRN: [id number] Sex: female DOB: Jul 11, 1976  Provider: Elveria Risingina Goodpasture, NP [facility]  Note type: Urgent return visit  History of Present Illness: Referral Source: Dr. Murray HodgkinsArthur Green History from: patient, referring office and [facility] Chief Complaint: Spasticity  [name] [name] is a 40 y.o. woman with history of spastic quadriparesis with dystonia. She is being seen urgently today because she called last week to report that she was having problems with increased spasticity in her legs that was preventing her from standing and taking steps. [name] [name] has an implanted [employer] pump that delivers intrathecal Baclofen and she asked for a dose increase to help reduce spasticity in her legs. Today she tells me that her right leg is worse than the left. She says that she has been able to get out of bed and pivot to her chair. She has been unable to do this for some time. [name] [name] wants to work on taking steps but says that her legs and feet are stiff and do not move. [name] [name] says that she received Botox injections in her arms and neck this week.   [name] [name] has been otherwise healthy and has no other complaints or concerns other than previously mentioned.  Procedure: reprogramming intrathecal baclofen pump.  The device was interrogated and showed a complex continuous infusion of baclofen that has a basal rate of 16.2 mcg per hour. The patient receives bolus doses of 27 mcg delivered at midnight, 6 AM, 12 noon, and 6 PM, for total of 465.8 mcg per day. Basal rate was increased to 17 mcg/h for total daily dose of 474.6 g.  This is a 2% increase.  The reservoir al changed from December 9 to November 06, 2016.  She tolerated the procedure well.  We will make small incremental changes to try to avoid increasing spasticity the point where she is unable to bear weight on her legs.  Review of Systems: Please see the HPI for neurologic and other pertinent  review of systems. Otherwise, the following systems are noncontributory including constitutional, eyes, ears, nose and throat, cardiovascular, respiratory, gastrointestinal, genitourinary, musculoskeletal, skin, endocrine, hematologic/lymph, allergic/immunologic and psychiatric.  Past Medical History:  Diagnosis Date  . Abdominal pain 01/02/2012   Overview:  Overview:  Per Previous pcp note- Dr. Ta--Pt has history of endometriosis and had DUB in 2011.  She She had u/s that showed normal endometrial stripe.  She had endometrial bx that was wnl.  She has a lot of abd cramping every month.  This cramping was sometimes not relieved with hydrocodone.  [facility] for IUD placement to help endometriosis and abd cramping.  [name]  . Abdominal pain 01/02/2012   Overview:  Overview:  Per Previous pcp note- Dr. Ta--Pt has history of endometriosis and had DUB in 2011.  She She had u/s that showed normal endometrial stripe.  She had endometrial bx that was wnl.  She has a lot of abd cramping every month.  This cramping was sometimes not relieved with hydrocodone.  She [facility] for IUD placement to help endometriosis and abd cramping.  [name] [facility]30/11 [facility].  Pt still complains of abd cramping, which I am treating with hydrocodone and ultram. Pt reports some initial relief of pain from IUD, but states it has now returned back to similar to previous cramping. Gave patient a shot of Toradol 60 mg IM today-since positive exacerbation of abdominal cramping during past 2 weeks.  Physical exam reassuring. Patient may need to return to [facility] workup since she is plugged in with the GYN providers there for further treatment options regarding endometriosis and abdominal cramping. The dysfunctional uterine bleeding now well contr  . Acute cystitis with hematuria   . Acute cystitis without hematuria   . Acute respiratory failure with hypoxia ([name])   . Anemia   .  Cellulitis 12/22/2014  . Cerebral palsy ([name])   . Cerebral palsy ([name])    Spastic Cerebral palsy, mentally intact  . Contracture, joint, multiple sites    Electric wheelchair, uses left hand to operate chair.   . Depression   . Disease of female genital organs 10/24/2010   Overview:  Overview:  Pt has history of endometriosis and had DUB in 2011.  She had u/s that showed normal endometrial stripe.  She had endometrial bx that was wnl.  She has a lot of abd cramping every month.  This cramping was sometimes not relieved with hydrocodone.  She was referred to [facility] for IUD placement to help endometriosis and abd cramping.  [name] was placed 04/30/11 by [facility].  Pt still complains of abd cramping, which was treated with hydrocodone and ultram.   Last seen at [facility] by OB/GYN 12/24/11, given Depo Lupron injection.  Ordering CT scan to r/o additional cause of abdominal pain (pt with known endometriosis and will not tolerate vaginal U/S, pain not improved s/p [name] or with Lupron injection-- only caused hot flashes and amenorrhea, no pain relief).  Also made referral to pain clinic for better pain control.  Do not think hysterectomy would help with pain, and concern for contamination of baclofen pump if did proceed with surgery.    8/5/13[name]: Pt was seen by   . [name]   . Dysarthria   . Dyspnea and respiratory abnormality 02/04/2011   Overview:  Overview:  Pt has history of recurrent PE and is on chronic coumadin.  Pt has been complaining of dyspnea that she describes as chest tightness.  She has been to the ER multiple times for this.  Usually she gets CTA and serial CE.  She has had at least 6 CTA in a period of since 2011.  She also has an O2 requirement. It is unclear why pt feels dyspneic, complains of chest tightness, or has O2 requirement.  I thought that her complaint may be from deconditioning and referred her to PT.  Unfortunately Medicaid only pays for 4 PT  visits.  Pt should be doing these exercises at home, but she has not.  I have referred her to [facility], and she has been seen by [name].  He did not understand the etiology of her dyspnea and O2 requirement.  He will continue to follow her.   Last Assessment & Plan:  Pt presents with concerns for chest tightness and dyspnea.  She was seen in ED yesterday and discharged home.  While there she had a CT chest 04/20/11 showed 1.  Multifocal degradation as detailed above.  N  . Endometriosis   . GERD (gastroesophageal reflux disease)   . Gram positive sepsis ([name]) 10/24/2014  . Gross hematuria 03/12/2013   Overview:  Overview:  status post replacement of suprapubic tube 03/04/2013  Last Assessment & Plan:  Recent urine culture collected in ED on 5/4 did not result in significant growth. Moreover, patient has been asymptomatic with normal WBC, no fever and no true symptoms making symptomatic UTI less  likely.  Will continue to monitor for any sign of infection.  Patient's xarelto had been held during her recent hospitalization due to hematuria. This has since been re-started. Monitor bleeding.    [name] Kitchen HCAP (healthcare-associated pneumonia) 10/25/2014  . History of anticoagulant therapy 10/21/2012  . History of endometriosis 10/2012   OBGYN [facility]: Laparoscopic Endometrial Ablation, TVH,  . History of recurrent UTIs   . Hypertension   . HYPERTENSION, BENIGN 01/31/2010   Qualifier: Diagnosis of  By: [name], MD, [name] Incontinent of feces 03/11/2016  . Infantile cerebral palsy ([name]) 01/29/2007   Overview:  She has spastic CP.  She is mentally intact.  She is wheelchair bound.  She is wheelchair bound for ambulation      . Intracervical pessary 05/07/2011   Overview:  Overview:  Placed by [facility] 04/30/11 to treat DUB and Endometriosis.  She is seen at GYN clinic at [facility] but was considered a poor surgical candidate and referred her to [facility].  For DUB she had pelvic ultrasound that showed thin  stripe and she had endometrial Bx by Dr Jennette Kettle in GYN clinic, which was negative.     . Macroscopic hematuria 03/10/2013   status post replacement of suprapubic tube 03/04/2013   . Migraine   . OCD (obsessive compulsive disorder)   . Parapneumonic effusion 10/25/2014  . Presence of intrathecal baclofen pump 03/07/2014   R LQ. Insertion T10.    [name] Kitchen Psychiatric illness 10/31/2014  . Pulmonary embolism ([name])    Lifetime Coumadin  . Pulmonary embolism ([name]) 2011, 01/2011   Will be on lifetime coumadin  . Recurrent pulmonary embolism ([name]) 05/03/2011   Pt has history of recurrent PE and is on chronic coumadin.  Pt has been complaining of dyspnea that she describes as chest tightness.  She has been to the ER multiple times for this.  Usually she gets CTA and serial CE.  She has had at least 6 CTA in a period of since 2011.  She also has an O2 requirement. It is unclear why pt feels dyspneic, complains of chest tightness, or has O2 requirement.  I thought that her complaint may be from deconditioning and referred her to PT.  Unfortunately Medicaid only pays for 4 PT visits.  Pt should be doing these exercises at home, but she has not.  I have referred her to [facility], and she has been seen by [name].  He did not understand the etiology of her dyspnea and O2 requirement.  He will continue to follow her.     . Seborrheic keratosis 01/18/2011  . Seizures ([name]) 02/12/2016  . Spasticity 01/05/2014  . Transient alteration of awareness, recurrent 09/08/2006   Recurrent episodes where [place]leigh  loses contact with her world and that have been studied thoroughly and appear nonepileptic in nature per Dr Terrace Arabia (neurology) The patient has had episodes starting in 2007 that initially began 1-4 times a day during which time she would have periods of unawareness followed by confusion. This continuous video EEG monitoring performed from October 8-12, 2007. Had   . Urethra dilated and patulous 07/03/2015   Patulous, Dilated Urethra. 5mL  balloon on a 22-Fr catheter hoping this would prevent the bladder spasm from pushing the balloon down the urethra (Dr Logan Bores, Urology [facility], July 2016)    Hospitalizations: Yes.  , Head Injury: No., Nervous System Infections: No., Immunizations up to date: Yes.   Past Medical History Comments: See history  Surgical History  Procedure Laterality Date  . ABDOMINAL HYSTERECTOMY    . APPENDECTOMY    . BACLOFEN PUMP REFILL     x 3 times  . CARPAL TUNNEL RELEASE  08/2008   Dr Teressa Senter  . CESAREAN SECTION     x 2  . CHOLECYSTECTOMY  10/14/2015   Procedure: LAPAROSCOPIC CHOLECYSTECTOMY;  Surgeon: Violeta Gelinas, MD;  Location: [facility] OR;  Service: General;;  . COLPOSCOPY  06/2000  . INTRAUTERINE DEVICE INSERTION  04/30/11   Inserted by [facility] for endometriosis  . laparoscopically assisted Vag Hysterectomy  10/27/2012  . MULTIPLE EXTRACTIONS WITH ALVEOLOPLASTY Bilateral 07/19/2013   Procedure: EXTRACTIONS #4, 4,09,81,19;  Surgeon: Francene Finders, DDS;  Location: [facility];  Service: Oral Surgery;  Laterality: Bilateral;  . NEUROMA SURGERY Left    anterior and posterior intersosseous  . PAIN PUMP IMPLANTATION N/A 03/07/2014   Procedure: baclofen pump revision/replacement and Catheter connection replacement;  Surgeon: Cristi Loron, MD;  Location: [facility];  Service: Neurosurgery;  Laterality: N/A;  baclofen pump revision/replacement and Catheter connection replacement  . PROGRAMABLE BACLOFEN PUMP REVISION  03/17/14   Battery Replacement   . TUBAL LIGATION  2003  . URETHRA SURGERY    . WRIST SURGERY  06/2010   Dr Dierdre Searles, hand surgeon, [name]   Family History family history includes Alzheimer's disease in her paternal grandfather; Asthma in her father; Breast cancer in her paternal grandmother; Cancer in her paternal grandmother; Colon cancer in her maternal grandmother; Heart attack in her maternal grandfather. Family History is otherwise negative for migraines, seizures, cognitive impairment,  blindness, deafness, birth defects, chromosomal disorder, autism.  Social History Social History   Social History  . Marital status: Divorced    Spouse name: N/A  . Number of children: 2  . Years of education: college   Occupational History  .  Unemployed  . UNEMPLOYED Unemployed   Social History Main Topics  . Smoking status: Never Smoker  . Smokeless tobacco: Never Used  . Alcohol use 0.6 oz/week    1 Glasses of wine per week     Comment: Drinks alcohol once a month.  . Drug use: No  . 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     She enjoys shopping.    Lives in a nursing facility[facility] since 02/11/16    Ailene Ards, who has schizophrenia, MR, is father of daughter.  Homero Fellers and pt are no longer together.  Homero Fellers does not see [name]. Daughter is 92 yo, Stage manager, lives with pt.    [name] [phone]), lives with pt's parents.    Needs assistance with ADL's and IADL's--has personal care services 20 hrs/wk;     No tobacco, EtOH, drugs.    **Patient very concerned about her kids being taken from her.    **Case Manager: Jack Quarto [phone]    Patient lives [facility]   Allergies Allergen Reactions  . Morphine Dermatitis and Other (See Comments)    Skin turned red   Physical Exam BP 130/70   Pulse 84   Ht 4\' 10"  (1.473 m)   Wt 116 lb (52.6 kg)   LMP 04/05/2011   BMI 24.24 kg/m  General: alert, chronically ill, listing to the right side in her wheelchair limited movement, well nourished, in no acute distress, brown hair, brown eyes, right handed Head: normocephalic, no dysmorphic features. She is unable to keep  her head midline.  Ears, Nose and Throat: Otoscopic: tympanic membranes normal; pharynx: oropharynx is pink without exudates or tonsillar hypertrophy Neck: supple, full range of motion, no cranial or cervical  bruits Respiratory: auscultation clear Cardiovascular: no murmurs, pulses are normal Musculoskeletal: no skeletal deformities or apparent scoliosis; Contractures at both wrists, unable to extend her right wrist in neutral, able to extend her left wrist to neutral; Flexion contractures at her hips, markedly increased hip adductor tone, contractures at the ankles with tight heel cords.  Skin: no rashes or neurocutaneous lesions She has a suprapubic catheter to drain her bladder  Neurologic Exam  Mental Status: alert; oriented to person, place and year; knowledge is normal for age; language is normal; It is difficult to understand her because of severe oral motor apraxia. Cranial Nerves: visual fields are full to double simultaneous stimuli; extraocular movements are full and conjugate; pupils are around reactive to light; funduscopic examination shows sharp disc margins with normal vessels; symmetric facial strength; midline tongue and uvula, However she is only able to slightly protrude her tongue to midline and to the right.  Motor: Spastic dystonic quadriparesis; Right arm is flexed at the elbow and extended behind her body with her right hand in a clawhand this. She is able to extend her fingers and grip with moderate weakness she has difficulty moving individual fingers.She is able to extend it from her body but is not able to fully extend her fingers. Her fingers are tightly flexed but can be extended. She has purposeful movement of them. She is not able to elevate either of her arms to her shoulders. The right leg has greater spasticity than the left leg. She is not able to move her feet. She can minimally abduct her legs at the hips. Range of motion reduces tone and extension at the knees and hips but not at the ankles.  Sensory: intact responses to touch and temperature Coordination: Unable to test Gait and Station: In a power wheelchair. I did not get her up to standing position  today. Reflexes: symmetric and diminished bilaterally because of cocontraction; no clonus  Impression 1. Athetoid cervical palsy, G80.3. 2. Dystonia, G24.9.  Recommendations for plan of care The patient's previous [facility] records were reviewed. [name] [name] has neither had nor required imaging or lab studies since the last visit. She is a 40 year old woman with spastic quadriparesis and dystonia. She has been working on being able to stand and pivot from the bed to the chair, as well as attempting to take steps but has been unable to do so because of spasticity and dystonia in her legs, right greater than left. I consulted with Dr Sharene Skeans, who came in to see the patient. He interrogated the Baclofen pump and made an adjustment in the dose. I asked [name] [name] to call me in a week or so to let me know how she is doing. She will return for refilling of the Baclofen pump in December as instructed by Dr Sharene Skeans or sooner if needed.   The medication list was reviewed and reconciled.  No changes were made in the prescribed medications today.  A complete medication list was provided to the patient.    Medication List   Accurate as of 07/11/16  3:44 PM.      acetaminophen 325 MG tablet Commonly known as:  TYLENOL Take 650 mg by mouth. Every 6 hours as needed   amantadine 100 MG capsule Commonly known as:  SYMMETREL Take 100 mg by mouth.  Take one tablet daily   baclofen 20 MG tablet Commonly known as:  LIORESAL Take 20 mg by mouth 4 (four) times daily. For bladder spasms   bisacodyl 10 MG suppository Commonly known as:  DULCOLAX Place 10 mg rectally as needed for moderate constipation.   clonazePAM 0.5 MG tablet Commonly known as:  KLONOPIN Take 0.5 mg by mouth. Take one three times daily   [employer] MILK OF MAGNESIA 400 MG/5ML suspension Generic drug:  magnesium hydroxide Take by mouth. Take 30ml as needed   dextromethorphan-guaiFENesin 30-600 MG 12hr tablet Commonly known as:  MUCINEX DM Take 1 tablet  by mouth 2 (two) times daily.   feeding supplement (ENSURE ENLIVE) Liqd Take 237 mLs by mouth 2 (two) times daily between meals.   flavoxATE 100 MG tablet Commonly known as:  URISPAS Take 100 mg by mouth 3 (three) times daily as needed for bladder spasms.   FLUoxetine 20 MG tablet Commonly known as:  PROZAC Take 20 mg by mouth daily.   ibuprofen 200 MG tablet Commonly known as:  ADVIL,MOTRIN Take 200 mg by mouth. Take 2 tablets every 8 hours as needed   lamoTRIgine 100 MG tablet Commonly known as:  LAMICTAL Take 100 mg by mouth at bedtime.   Melatonin 5 MG Tabs Take by mouth. Take one tablet at bedtime   METAMUCIL FIBER PO Take by mouth. One tablespoon in 6 oz water or juice daily   multivitamin tablet Take 1 tablet by mouth daily.   omeprazole 20 MG capsule Commonly known as:  PRILOSEC Take 20 mg by mouth. Take one tablet daily   ondansetron 8 MG disintegrating tablet Commonly known as:  ZOFRAN-ODT Take 1 tablet (8 mg total) by mouth every 8 (eight) hours as needed for nausea or vomiting.   oxybutynin 5 MG tablet Commonly known as:  DITROPAN Take 5 mg by mouth. Take one tablet four times a day   oxyCODONE 5 MG immediate release tablet Commonly known as:  ROXICODONE Take one tablet by mouth every 4 hours as needed for pain   promethazine 12.5 MG tablet Commonly known as:  PHENERGAN Take 12.5 mg by mouth every 6 (six) hours as needed for nausea or vomiting.   QUEtiapine 100 MG tablet Commonly known as:  SEROQUEL Take 1 tablet (100 mg total) by mouth at bedtime.   senna 8.6 MG Tabs tablet Commonly known as:  SENOKOT 2 nightly to help constipation   tiZANidine 4 MG capsule Commonly known as:  ZANAFLEX Take 4 mg by mouth 2 (two) times daily at 8 am and 10 pm.   warfarin 7.5 MG tablet Commonly known as:  COUMADIN 8.5mg  daily to anticoagulate blood to prevent pulmonary emboli. May give as 7.5 mg plus 1 mg tablet daily.     Total time spent with the patient  was 25 minutes, of which 50% or more was spent in counseling and coordination of care.   Elveria Rising

## 2016-07-12 DIAGNOSIS — R278 Other lack of coordination: Secondary | ICD-10-CM | POA: Diagnosis not present

## 2016-07-12 DIAGNOSIS — G809 Cerebral palsy, unspecified: Secondary | ICD-10-CM | POA: Diagnosis not present

## 2016-07-12 DIAGNOSIS — R2681 Unsteadiness on feet: Secondary | ICD-10-CM | POA: Diagnosis not present

## 2016-07-12 DIAGNOSIS — M6281 Muscle weakness (generalized): Secondary | ICD-10-CM | POA: Diagnosis not present

## 2016-07-12 DIAGNOSIS — R293 Abnormal posture: Secondary | ICD-10-CM | POA: Diagnosis not present

## 2016-07-12 DIAGNOSIS — A419 Sepsis, unspecified organism: Secondary | ICD-10-CM | POA: Diagnosis not present

## 2016-07-15 DIAGNOSIS — G809 Cerebral palsy, unspecified: Secondary | ICD-10-CM | POA: Diagnosis not present

## 2016-07-15 DIAGNOSIS — R2681 Unsteadiness on feet: Secondary | ICD-10-CM | POA: Diagnosis not present

## 2016-07-15 DIAGNOSIS — R278 Other lack of coordination: Secondary | ICD-10-CM | POA: Diagnosis not present

## 2016-07-15 DIAGNOSIS — A419 Sepsis, unspecified organism: Secondary | ICD-10-CM | POA: Diagnosis not present

## 2016-07-15 DIAGNOSIS — M6281 Muscle weakness (generalized): Secondary | ICD-10-CM | POA: Diagnosis not present

## 2016-07-15 DIAGNOSIS — R293 Abnormal posture: Secondary | ICD-10-CM | POA: Diagnosis not present

## 2016-07-16 DIAGNOSIS — G809 Cerebral palsy, unspecified: Secondary | ICD-10-CM | POA: Diagnosis not present

## 2016-07-16 DIAGNOSIS — R293 Abnormal posture: Secondary | ICD-10-CM | POA: Diagnosis not present

## 2016-07-16 DIAGNOSIS — R2681 Unsteadiness on feet: Secondary | ICD-10-CM | POA: Diagnosis not present

## 2016-07-16 DIAGNOSIS — A419 Sepsis, unspecified organism: Secondary | ICD-10-CM | POA: Diagnosis not present

## 2016-07-16 DIAGNOSIS — M6281 Muscle weakness (generalized): Secondary | ICD-10-CM | POA: Diagnosis not present

## 2016-07-16 DIAGNOSIS — R278 Other lack of coordination: Secondary | ICD-10-CM | POA: Diagnosis not present

## 2016-07-17 DIAGNOSIS — G808 Other cerebral palsy: Secondary | ICD-10-CM | POA: Diagnosis not present

## 2016-07-17 DIAGNOSIS — A419 Sepsis, unspecified organism: Secondary | ICD-10-CM | POA: Diagnosis not present

## 2016-07-17 DIAGNOSIS — R2681 Unsteadiness on feet: Secondary | ICD-10-CM | POA: Diagnosis not present

## 2016-07-17 DIAGNOSIS — G809 Cerebral palsy, unspecified: Secondary | ICD-10-CM | POA: Diagnosis not present

## 2016-07-17 DIAGNOSIS — I2699 Other pulmonary embolism without acute cor pulmonale: Secondary | ICD-10-CM | POA: Diagnosis not present

## 2016-07-17 DIAGNOSIS — R278 Other lack of coordination: Secondary | ICD-10-CM | POA: Diagnosis not present

## 2016-07-17 DIAGNOSIS — R293 Abnormal posture: Secondary | ICD-10-CM | POA: Diagnosis not present

## 2016-07-17 DIAGNOSIS — M6281 Muscle weakness (generalized): Secondary | ICD-10-CM | POA: Diagnosis not present

## 2016-07-17 DIAGNOSIS — F428 Other obsessive-compulsive disorder: Secondary | ICD-10-CM | POA: Diagnosis not present

## 2016-07-17 DIAGNOSIS — K219 Gastro-esophageal reflux disease without esophagitis: Secondary | ICD-10-CM | POA: Diagnosis not present

## 2016-07-18 DIAGNOSIS — M6281 Muscle weakness (generalized): Secondary | ICD-10-CM | POA: Diagnosis not present

## 2016-07-18 DIAGNOSIS — A419 Sepsis, unspecified organism: Secondary | ICD-10-CM | POA: Diagnosis not present

## 2016-07-18 DIAGNOSIS — R278 Other lack of coordination: Secondary | ICD-10-CM | POA: Diagnosis not present

## 2016-07-18 DIAGNOSIS — G809 Cerebral palsy, unspecified: Secondary | ICD-10-CM | POA: Diagnosis not present

## 2016-07-18 DIAGNOSIS — Z9359 Other cystostomy status: Secondary | ICD-10-CM | POA: Diagnosis not present

## 2016-07-18 DIAGNOSIS — R293 Abnormal posture: Secondary | ICD-10-CM | POA: Diagnosis not present

## 2016-07-18 DIAGNOSIS — R2681 Unsteadiness on feet: Secondary | ICD-10-CM | POA: Diagnosis not present

## 2016-07-19 ENCOUNTER — Telehealth: Payer: Self-pay | Admitting: Physical Medicine & Rehabilitation

## 2016-07-19 DIAGNOSIS — K5909 Other constipation: Secondary | ICD-10-CM | POA: Diagnosis not present

## 2016-07-19 DIAGNOSIS — R278 Other lack of coordination: Secondary | ICD-10-CM | POA: Diagnosis not present

## 2016-07-19 DIAGNOSIS — G809 Cerebral palsy, unspecified: Secondary | ICD-10-CM | POA: Diagnosis not present

## 2016-07-19 DIAGNOSIS — A419 Sepsis, unspecified organism: Secondary | ICD-10-CM | POA: Diagnosis not present

## 2016-07-19 DIAGNOSIS — R2681 Unsteadiness on feet: Secondary | ICD-10-CM | POA: Diagnosis not present

## 2016-07-19 DIAGNOSIS — M6281 Muscle weakness (generalized): Secondary | ICD-10-CM | POA: Diagnosis not present

## 2016-07-19 DIAGNOSIS — R293 Abnormal posture: Secondary | ICD-10-CM | POA: Diagnosis not present

## 2016-07-19 NOTE — Telephone Encounter (Signed)
Patient has called in to let the nurse/doctor know that the botox did not work for her neck - - she wants some more information on the nerve block or to possibily schedule one - please call pt to answer her questions

## 2016-07-20 DIAGNOSIS — R278 Other lack of coordination: Secondary | ICD-10-CM | POA: Diagnosis not present

## 2016-07-20 DIAGNOSIS — M6281 Muscle weakness (generalized): Secondary | ICD-10-CM | POA: Diagnosis not present

## 2016-07-20 DIAGNOSIS — G809 Cerebral palsy, unspecified: Secondary | ICD-10-CM | POA: Diagnosis not present

## 2016-07-20 DIAGNOSIS — R2681 Unsteadiness on feet: Secondary | ICD-10-CM | POA: Diagnosis not present

## 2016-07-20 DIAGNOSIS — A419 Sepsis, unspecified organism: Secondary | ICD-10-CM | POA: Diagnosis not present

## 2016-07-20 DIAGNOSIS — R293 Abnormal posture: Secondary | ICD-10-CM | POA: Diagnosis not present

## 2016-07-20 NOTE — Telephone Encounter (Signed)
Not sure patient remains by nerve block. She cannot have a cervical spinal injection because of anticoagulation Botox may not have taken full effect yet, we will need to discuss at next office visit

## 2016-07-22 DIAGNOSIS — R278 Other lack of coordination: Secondary | ICD-10-CM | POA: Diagnosis not present

## 2016-07-22 DIAGNOSIS — A419 Sepsis, unspecified organism: Secondary | ICD-10-CM | POA: Diagnosis not present

## 2016-07-22 DIAGNOSIS — G809 Cerebral palsy, unspecified: Secondary | ICD-10-CM | POA: Diagnosis not present

## 2016-07-22 DIAGNOSIS — R2681 Unsteadiness on feet: Secondary | ICD-10-CM | POA: Diagnosis not present

## 2016-07-22 DIAGNOSIS — R293 Abnormal posture: Secondary | ICD-10-CM | POA: Diagnosis not present

## 2016-07-22 DIAGNOSIS — M6281 Muscle weakness (generalized): Secondary | ICD-10-CM | POA: Diagnosis not present

## 2016-07-23 DIAGNOSIS — G809 Cerebral palsy, unspecified: Secondary | ICD-10-CM | POA: Diagnosis not present

## 2016-07-23 DIAGNOSIS — R278 Other lack of coordination: Secondary | ICD-10-CM | POA: Diagnosis not present

## 2016-07-23 DIAGNOSIS — A419 Sepsis, unspecified organism: Secondary | ICD-10-CM | POA: Diagnosis not present

## 2016-07-23 DIAGNOSIS — M6281 Muscle weakness (generalized): Secondary | ICD-10-CM | POA: Diagnosis not present

## 2016-07-23 DIAGNOSIS — R293 Abnormal posture: Secondary | ICD-10-CM | POA: Diagnosis not present

## 2016-07-23 DIAGNOSIS — R2681 Unsteadiness on feet: Secondary | ICD-10-CM | POA: Diagnosis not present

## 2016-07-23 NOTE — Telephone Encounter (Signed)
I told her and she kept talking about her arm.  I have asked that they schedule appt for her to talk with Dr Wynn Bankerkirsteins as I cannot understand what she is referring to when she talks about her arm.

## 2016-07-24 ENCOUNTER — Telehealth: Payer: Self-pay | Admitting: Family

## 2016-07-24 DIAGNOSIS — R2681 Unsteadiness on feet: Secondary | ICD-10-CM | POA: Diagnosis not present

## 2016-07-24 DIAGNOSIS — M6281 Muscle weakness (generalized): Secondary | ICD-10-CM | POA: Diagnosis not present

## 2016-07-24 DIAGNOSIS — A419 Sepsis, unspecified organism: Secondary | ICD-10-CM | POA: Diagnosis not present

## 2016-07-24 DIAGNOSIS — G809 Cerebral palsy, unspecified: Secondary | ICD-10-CM | POA: Diagnosis not present

## 2016-07-24 DIAGNOSIS — R278 Other lack of coordination: Secondary | ICD-10-CM | POA: Diagnosis not present

## 2016-07-24 DIAGNOSIS — R293 Abnormal posture: Secondary | ICD-10-CM | POA: Diagnosis not present

## 2016-07-24 NOTE — Telephone Encounter (Signed)
That is very good news.

## 2016-07-24 NOTE — Telephone Encounter (Signed)
[name] called and said that she wanted to let Dr Sharene SkeansHickling know that since the Baclofen increase 2 weeks ago that her ability to walk has improved and that she is able to use a walker. She is pleased with her improvement. TG

## 2016-07-25 DIAGNOSIS — R293 Abnormal posture: Secondary | ICD-10-CM | POA: Diagnosis not present

## 2016-07-25 DIAGNOSIS — A419 Sepsis, unspecified organism: Secondary | ICD-10-CM | POA: Diagnosis not present

## 2016-07-25 DIAGNOSIS — R278 Other lack of coordination: Secondary | ICD-10-CM | POA: Diagnosis not present

## 2016-07-25 DIAGNOSIS — G809 Cerebral palsy, unspecified: Secondary | ICD-10-CM | POA: Diagnosis not present

## 2016-07-25 DIAGNOSIS — M6281 Muscle weakness (generalized): Secondary | ICD-10-CM | POA: Diagnosis not present

## 2016-07-25 DIAGNOSIS — R2681 Unsteadiness on feet: Secondary | ICD-10-CM | POA: Diagnosis not present

## 2016-07-26 ENCOUNTER — Ambulatory Visit: Payer: Medicare Other | Admitting: Physical Medicine & Rehabilitation

## 2016-07-26 DIAGNOSIS — M6281 Muscle weakness (generalized): Secondary | ICD-10-CM | POA: Diagnosis not present

## 2016-07-26 DIAGNOSIS — A419 Sepsis, unspecified organism: Secondary | ICD-10-CM | POA: Diagnosis not present

## 2016-07-26 DIAGNOSIS — G809 Cerebral palsy, unspecified: Secondary | ICD-10-CM | POA: Diagnosis not present

## 2016-07-26 DIAGNOSIS — R278 Other lack of coordination: Secondary | ICD-10-CM | POA: Diagnosis not present

## 2016-07-26 DIAGNOSIS — R2681 Unsteadiness on feet: Secondary | ICD-10-CM | POA: Diagnosis not present

## 2016-07-26 DIAGNOSIS — R293 Abnormal posture: Secondary | ICD-10-CM | POA: Diagnosis not present

## 2016-07-29 DIAGNOSIS — G809 Cerebral palsy, unspecified: Secondary | ICD-10-CM | POA: Diagnosis not present

## 2016-07-29 DIAGNOSIS — R2681 Unsteadiness on feet: Secondary | ICD-10-CM | POA: Diagnosis not present

## 2016-07-29 DIAGNOSIS — M6281 Muscle weakness (generalized): Secondary | ICD-10-CM | POA: Diagnosis not present

## 2016-07-29 DIAGNOSIS — R293 Abnormal posture: Secondary | ICD-10-CM | POA: Diagnosis not present

## 2016-07-29 DIAGNOSIS — R278 Other lack of coordination: Secondary | ICD-10-CM | POA: Diagnosis not present

## 2016-07-29 DIAGNOSIS — A419 Sepsis, unspecified organism: Secondary | ICD-10-CM | POA: Diagnosis not present

## 2016-07-30 DIAGNOSIS — R2681 Unsteadiness on feet: Secondary | ICD-10-CM | POA: Diagnosis not present

## 2016-07-30 DIAGNOSIS — R293 Abnormal posture: Secondary | ICD-10-CM | POA: Diagnosis not present

## 2016-07-30 DIAGNOSIS — M6281 Muscle weakness (generalized): Secondary | ICD-10-CM | POA: Diagnosis not present

## 2016-07-30 DIAGNOSIS — G809 Cerebral palsy, unspecified: Secondary | ICD-10-CM | POA: Diagnosis not present

## 2016-07-30 DIAGNOSIS — A419 Sepsis, unspecified organism: Secondary | ICD-10-CM | POA: Diagnosis not present

## 2016-07-30 DIAGNOSIS — R278 Other lack of coordination: Secondary | ICD-10-CM | POA: Diagnosis not present

## 2016-07-31 DIAGNOSIS — R2681 Unsteadiness on feet: Secondary | ICD-10-CM | POA: Diagnosis not present

## 2016-07-31 DIAGNOSIS — G809 Cerebral palsy, unspecified: Secondary | ICD-10-CM | POA: Diagnosis not present

## 2016-07-31 DIAGNOSIS — A419 Sepsis, unspecified organism: Secondary | ICD-10-CM | POA: Diagnosis not present

## 2016-07-31 DIAGNOSIS — R278 Other lack of coordination: Secondary | ICD-10-CM | POA: Diagnosis not present

## 2016-07-31 DIAGNOSIS — M6281 Muscle weakness (generalized): Secondary | ICD-10-CM | POA: Diagnosis not present

## 2016-07-31 DIAGNOSIS — R293 Abnormal posture: Secondary | ICD-10-CM | POA: Diagnosis not present

## 2016-08-01 DIAGNOSIS — M6281 Muscle weakness (generalized): Secondary | ICD-10-CM | POA: Diagnosis not present

## 2016-08-01 DIAGNOSIS — G809 Cerebral palsy, unspecified: Secondary | ICD-10-CM | POA: Diagnosis not present

## 2016-08-01 DIAGNOSIS — R293 Abnormal posture: Secondary | ICD-10-CM | POA: Diagnosis not present

## 2016-08-01 DIAGNOSIS — R2681 Unsteadiness on feet: Secondary | ICD-10-CM | POA: Diagnosis not present

## 2016-08-01 DIAGNOSIS — R278 Other lack of coordination: Secondary | ICD-10-CM | POA: Diagnosis not present

## 2016-08-01 DIAGNOSIS — A419 Sepsis, unspecified organism: Secondary | ICD-10-CM | POA: Diagnosis not present

## 2016-08-01 DIAGNOSIS — F331 Major depressive disorder, recurrent, moderate: Secondary | ICD-10-CM | POA: Diagnosis not present

## 2016-08-02 ENCOUNTER — Encounter: Payer: Self-pay | Admitting: Physical Medicine & Rehabilitation

## 2016-08-02 ENCOUNTER — Encounter: Payer: Medicare Other | Attending: Physical Medicine & Rehabilitation

## 2016-08-02 ENCOUNTER — Ambulatory Visit (HOSPITAL_BASED_OUTPATIENT_CLINIC_OR_DEPARTMENT_OTHER): Payer: Medicare Other | Admitting: Physical Medicine & Rehabilitation

## 2016-08-02 VITALS — BP 105/74 | HR 85 | Resp 14

## 2016-08-02 DIAGNOSIS — R489 Unspecified symbolic dysfunctions: Secondary | ICD-10-CM | POA: Diagnosis not present

## 2016-08-02 DIAGNOSIS — M6281 Muscle weakness (generalized): Secondary | ICD-10-CM | POA: Diagnosis not present

## 2016-08-02 DIAGNOSIS — N319 Neuromuscular dysfunction of bladder, unspecified: Secondary | ICD-10-CM

## 2016-08-02 DIAGNOSIS — R252 Cramp and spasm: Secondary | ICD-10-CM | POA: Diagnosis not present

## 2016-08-02 DIAGNOSIS — K5909 Other constipation: Secondary | ICD-10-CM | POA: Diagnosis not present

## 2016-08-02 DIAGNOSIS — G808 Other cerebral palsy: Secondary | ICD-10-CM

## 2016-08-02 DIAGNOSIS — R1311 Dysphagia, oral phase: Secondary | ICD-10-CM | POA: Diagnosis not present

## 2016-08-02 DIAGNOSIS — R2681 Unsteadiness on feet: Secondary | ICD-10-CM | POA: Diagnosis not present

## 2016-08-02 DIAGNOSIS — G809 Cerebral palsy, unspecified: Secondary | ICD-10-CM | POA: Diagnosis not present

## 2016-08-02 DIAGNOSIS — A419 Sepsis, unspecified organism: Secondary | ICD-10-CM | POA: Diagnosis not present

## 2016-08-02 DIAGNOSIS — M25562 Pain in left knee: Secondary | ICD-10-CM | POA: Diagnosis not present

## 2016-08-02 DIAGNOSIS — R278 Other lack of coordination: Secondary | ICD-10-CM | POA: Diagnosis not present

## 2016-08-02 NOTE — Progress Notes (Signed)
Subjective:    Patient ID: [name] [name], female    DOB: 03-04-1976, 40 y.o.   MRN: [id number]  HPI Botox injection spasticity 8/7 Right side Biceps50 FCR50 FCU0 FDS50 FDP50  Botox injection for cervical dystonia 75 units in the right splenius capitis 75 units in the right trapezius 50 units in the right sternocleidomastoid, upper aspect   Patient feels that the Botox for the upper extremity on the right side was helpful. However, the Botox injection for the cervical dystonia was not helpful.  Patient feels like she still has some problems with biceps spasticity. However, on further questioning, she states that she can extended all the way but sometimes when she reaches for objects. She cannot extend.  She has a urologist, [place]ans in [place], has suprapubic tube. Also receives Botox through the suprapubic tube Pain Inventory Average Pain 0 Pain Right Now 0 My pain is no pain  In the last 24 hours, has pain interfered with the following? General activity 0 Relation with others 0 Enjoyment of life 0 What TIME of day is your pain at its worst? no pain Sleep (in general) Fair  Pain is worse with: no pain Pain improves with: no pain Relief from Meds: no pain  Mobility use a wheelchair needs help with transfers  Function disabled: date disabled .  Neuro/Psych bladder control problems bowel control problems  Prior Studies Any changes since last visit?  no  Physicians involved in your care Any changes since last visit?  no   Family History  Problem Relation Age of Onset  . Asthma Father   . Colon cancer Maternal Grandmother     Died in her 260's  . Cancer Paternal Grandmother   . Breast cancer Paternal Grandmother     Died in her 6470's  . Heart attack Maternal Grandfather     Died in his 6670's  . Alzheimer's disease Paternal Grandfather     Died in his 8080's   Social History   Social History  . Marital status: Divorced    Spouse name: N/A  . Number  of children: 2  . Years of education: college   Occupational History  .  Unemployed  . UNEMPLOYED Unemployed   Social History Main Topics  . Smoking status: Never Smoker  . Smokeless tobacco: Never Used  . Alcohol use 0.6 oz/week    1 Glasses of wine per week     Comment: Drinks alcohol once a month.  . Drug use: No  . Sexual activity: No   Other Topics Concern  . None   Social History Narrative   [name] [name] graduated from college with an Scientist, research (physical sciences)Associates Degree in Surveyor, mineralsCriminal Justice.    She enjoys shopping.   Lives in a nursing facility,[facility] since 02/11/16      Ailene ArdsFrank [name], who has schizophrenia, MR, is father of daughter.  [name] FellersFrank and pt are no longer together.  [name] FellersFrank does not see [name] [name]. Daughter is 58 yo, Stage managerKiarra [name], lives with pt.        [name] [name] [phone](1979), lives with pt's parents.      Needs assistance with ADL's and IADL's--has personal care services 20 hrs/wk;       No tobacco, EtOH, drugs.      **Patient very concerned about her kids being taken from her.      **Case Manager: [name] [name] [phone]      [facility]   Past Surgical History:  Procedure Laterality Date  .  ABDOMINAL HYSTERECTOMY    . APPENDECTOMY    . BACLOFEN PUMP REFILL     x 3 times  . CARPAL TUNNEL RELEASE  08/2008   Dr Teressa Senter  . CESAREAN SECTION     x 2  . CHOLECYSTECTOMY  10/14/2015   Procedure: LAPAROSCOPIC CHOLECYSTECTOMY;  Surgeon: Violeta Gelinas, MD;  Location: [facility] OR;  Service: General;;  . COLPOSCOPY  06/2000  . INTRAUTERINE DEVICE INSERTION  04/30/11   Inserted by [facility] GYN for endometriosis  . laparoscopically assisted Vag Hysterectomy  10/27/2012  . MULTIPLE EXTRACTIONS WITH ALVEOLOPLASTY Bilateral 07/19/2013   Procedure: EXTRACTIONS #4, 1,61,09,60;  Surgeon: Francene Finders, DDS;  Location: [facility] OR;  Service: Oral Surgery;  Laterality: Bilateral;  . NEUROMA SURGERY Left    anterior and posterior intersosseous  . PAIN PUMP  IMPLANTATION N/A 03/07/2014   Procedure: baclofen pump revision/replacement and Catheter connection replacement;  Surgeon: Cristi Loron, MD;  Location: [facility] ORS;  Service: Neurosurgery;  Laterality: N/A;  baclofen pump revision/replacement and Catheter connection replacement  . PROGRAMABLE BACLOFEN PUMP REVISION  03/17/14   Battery Replacement   . TUBAL LIGATION  2003  . URETHRA SURGERY    . WRIST SURGERY  06/2010   Dr Dierdre Searles, hand surgeon, [name]   Past Medical History:  Diagnosis Date  . Abdominal pain 01/02/2012   Overview:  Overview:  Per Previous pcp note- Dr. Ta--Pt has history of endometriosis and had DUB in 2011.  She She had u/s that showed normal endometrial stripe.  She had endometrial bx that was wnl.  She has a lot of abd cramping every month.  This cramping was sometimes not relieved with hydrocodone.  She was referred to [facility] for IUD placement to help endometriosis and abd cramping.  [name]  . Abdominal pain 01/02/2012   Overview:  Overview:  Per Previous pcp note- Dr. Ta--Pt has history of endometriosis and had DUB in 2011.  She She had u/s that showed normal endometrial stripe.  She had endometrial bx that was wnl.  She has a lot of abd cramping every month.  This cramping was sometimes not relieved with hydrocodone.  She was referred to [facility] for IUD placement to help endometriosis and abd cramping.  [name] was placed 04/30/11 by [facility].  Pt still complains of abd cramping, which I am treating with hydrocodone and ultram. Pt reports some initial relief of pain from IUD, but states it has now returned back to similar to previous cramping. Gave patient a shot of Toradol 60 mg IM today-since positive exacerbation of abdominal cramping during past 2 weeks. Physical exam reassuring. Patient may need to return to [facility] for further workup since she is plugged in with the GYN providers there for further treatment options regarding endometriosis and abdominal cramping. The  dysfunctional uterine bleeding now well contr  . Acute cystitis with hematuria   . Acute cystitis without hematuria   . Acute respiratory failure with hypoxia ([name])   . Anemia   . Cellulitis 12/22/2014  . Cerebral palsy ([name])   . Cerebral palsy ([name])    Spastic Cerebral palsy, mentally intact  . Contracture, joint, multiple sites    Electric wheelchair, uses left hand to operate chair.   . Depression   . Disease of female genital organs 10/24/2010   Overview:  Overview:  Pt has history of endometriosis and had DUB in 2011.  She had u/s that showed normal endometrial stripe.  She had endometrial bx that was  wnl.  She has a lot of abd cramping every month.  This cramping was sometimes not relieved with hydrocodone.  She was referred to [facility] for IUD placement to help endometriosis and abd cramping.  [name] was placed 04/30/11 by [facility].  Pt still complains of abd cramping, which was treated with hydrocodone and ultram.   Last seen at [facility] by OB/GYN 12/24/11, given Depo Lupron injection.  Ordering CT scan to r/o additional cause of abdominal pain (pt with known endometriosis and will not tolerate vaginal U/S, pain not improved s/p [name] or with Lupron injection-- only caused hot flashes and amenorrhea, no pain relief).  Also made referral to pain clinic for better pain control.  Do not think hysterectomy would help with pain, and concern for contamination of baclofen pump if did proceed with surgery.    8/5/13[name]: Pt was seen by   . [name]   . Dysarthria   . Dyspnea and respiratory abnormality 02/04/2011   Overview:  Overview:  Pt has history of recurrent PE and is on chronic coumadin.  Pt has been complaining of dyspnea that she describes as chest tightness.  She has been to the ER multiple times for this.  Usually she gets CTA and serial CE.  She has had at least 6 CTA in a period of since 2011.  She also has an O2 requirement. It is unclear why pt feels  dyspneic, complains of chest tightness, or has O2 requirement.  I thought that her complaint may be from deconditioning and referred her to PT.  Unfortunately Medicaid only pays for 4 PT visits.  Pt should be doing these exercises at home, but she has not.  I have referred her to [facility], and she has been seen by [name].  He did not understand the etiology of her dyspnea and O2 requirement.  He will continue to follow her.   Last Assessment & Plan:  Pt presents with concerns for chest tightness and dyspnea.  She was seen in ED yesterday and discharged home.  While there she had a CT chest 04/20/11 showed 1.  Multifocal degradation as detailed above.  N  . Endometriosis   . GERD (gastroesophageal reflux disease)   . Gram positive sepsis ([name]) 10/24/2014  . Gross hematuria 03/12/2013   Overview:  Overview:  status post replacement of suprapubic tube 03/04/2013  Last Assessment & Plan:  Recent urine culture collected in ED on 5/4 did not result in significant growth. Moreover, patient has been asymptomatic with normal WBC, no fever and no true symptoms making symptomatic UTI less likely.  Will continue to monitor for any sign of infection.  Patient's xarelto had been held during her recent hospitalization due to hematuria. This has since been re-started. Monitor bleeding.    [name] Kitchen HCAP (healthcare-associated pneumonia) 10/25/2014  . History of anticoagulant therapy 10/21/2012  . History of endometriosis 10/2012   OBGYN [facility]: Laparoscopic Endometrial Ablation, TVH,  . History of recurrent UTIs   . Hypertension   . HYPERTENSION, BENIGN 01/31/2010   Qualifier: Diagnosis of  By: [name], MD, [name] Incontinent of feces 03/11/2016  . Infantile cerebral palsy ([name]) 01/29/2007   Overview:  She has spastic CP.  She is mentally intact.  She is wheelchair bound.  She is wheelchair bound for ambulation      . Intracervical pessary 05/07/2011   Overview:  Overview:  Placed by [facility] GYN 04/30/11 to  treat DUB  and Endometriosis.  She is seen at GYN clinic at [facility] but was considered a poor surgical candidate and referred her to [facility].  For DUB she had pelvic ultrasound that showed thin stripe and she had endometrial Bx by Dr Jennette Kettle in GYN clinic, which was negative.     . Macroscopic hematuria 03/10/2013   status post replacement of suprapubic tube 03/04/2013   . Migraine   . OCD (obsessive compulsive disorder)   . Parapneumonic effusion 10/25/2014  . Presence of intrathecal baclofen pump 03/07/2014   R LQ. Insertion T10.    [name] Kitchen Psychiatric illness 10/31/2014  . Pulmonary embolism ([name])    Lifetime Coumadin  . Pulmonary embolism ([name]) 2011, 01/2011   Will be on lifetime coumadin  . Recurrent pulmonary embolism ([name]) 05/03/2011   Pt has history of recurrent PE and is on chronic coumadin.  Pt has been complaining of dyspnea that she describes as chest tightness.  She has been to the ER multiple times for this.  Usually she gets CTA and serial CE.  She has had at least 6 CTA in a period of since 2011.  She also has an O2 requirement. It is unclear why pt feels dyspneic, complains of chest tightness, or has O2 requirement.  I thought that her complaint may be from deconditioning and referred her to PT.  Unfortunately Medicaid only pays for 4 PT visits.  Pt should be doing these exercises at home, but she has not.  I have referred her to [facility], and she has been seen by [name].  He did not understand the etiology of her dyspnea and O2 requirement.  He will continue to follow her.     . Seborrheic keratosis 01/18/2011  . Seizures ([name]) 02/12/2016  . Spasticity 01/05/2014  . Transient alteration of awareness, recurrent 09/08/2006   Recurrent episodes where [name] [name]  loses contact with her world and that have been studied thoroughly and appear nonepileptic in nature per Dr Terrace Arabia (neurology) The patient has had episodes starting in 2007 that initially began 1-4 times a day during which time she would have periods of  unawareness followed by confusion. This continuous video EEG monitoring performed from October 8-12, 2007. Had   . Urethra dilated and patulous 07/03/2015   Patulous, Dilated Urethra. 5mL balloon on a 22-Fr catheter hoping this would prevent the bladder spasm from pushing the balloon down the urethra (Dr Logan Bores, Urology [facility], July 2016)    BP 105/74 (BP Location: Left Arm, Patient Position: Sitting, Cuff Size: Normal)   Pulse 85   Resp 14   LMP 04/05/2011   SpO2 96%   Opioid Risk Score:   Fall Risk Score:  `1  Depression screen PHQ 2/9  Depression screen PHQ 2/9 01/08/2016  Decreased Interest 0  Down, Depressed, Hopeless 0  PHQ - 2 Score 0  Some recent data might be hidden    Review of Systems  Constitutional: Negative.   HENT: Negative.   Eyes: Negative.   Cardiovascular: Negative.   Gastrointestinal: Negative.   Endocrine: Negative.   Genitourinary: Negative.        Neurogenic bladder  Allergic/Immunologic: Negative.   Neurological: Negative.   Hematological: Negative.   Psychiatric/Behavioral: Negative.   All other systems reviewed and are negative.      Objective:   Physical Exam Right elbow is completely extended while patient resting in chair. However, when she is reaching for objects. She does have some problems with extending the hand extending the arm as well  as extending the fingers.  Her neck shows right lateral Collis. She can easily be straightened to a neutral position, however. She has some increased tone noted at the splenius capitis as well as the trapezius.       Assessment & Plan:  Plan repeat Botox injection not earlier than 10/08/2016 with the following doses Biceps50 FCR50 FCU50 FDS50 FDP50  2. Cervical dystonia, somewhat disappointing results with the Botox. She was given doses in the upper range. Therefore, I do not think a higher dose will be more helpful. We discussed other treatment options. Physical therapy stretching medications.  Already on tizanidine and baclofen. Currently receiving some physical therapy and some stretching. May do well with a soft cervical collar

## 2016-08-02 NOTE — Patient Instructions (Addendum)
Referral made to [facility], urology, which is across from my old office, [facility] Dr. McDiarmid is a neurourologist

## 2016-08-05 DIAGNOSIS — G809 Cerebral palsy, unspecified: Secondary | ICD-10-CM | POA: Diagnosis not present

## 2016-08-05 DIAGNOSIS — R278 Other lack of coordination: Secondary | ICD-10-CM | POA: Diagnosis not present

## 2016-08-05 DIAGNOSIS — A419 Sepsis, unspecified organism: Secondary | ICD-10-CM | POA: Diagnosis not present

## 2016-08-05 DIAGNOSIS — M6281 Muscle weakness (generalized): Secondary | ICD-10-CM | POA: Diagnosis not present

## 2016-08-05 DIAGNOSIS — R2681 Unsteadiness on feet: Secondary | ICD-10-CM | POA: Diagnosis not present

## 2016-08-05 DIAGNOSIS — R1311 Dysphagia, oral phase: Secondary | ICD-10-CM | POA: Diagnosis not present

## 2016-08-06 DIAGNOSIS — R278 Other lack of coordination: Secondary | ICD-10-CM | POA: Diagnosis not present

## 2016-08-06 DIAGNOSIS — R2681 Unsteadiness on feet: Secondary | ICD-10-CM | POA: Diagnosis not present

## 2016-08-06 DIAGNOSIS — L700 Acne vulgaris: Secondary | ICD-10-CM | POA: Diagnosis not present

## 2016-08-06 DIAGNOSIS — R1311 Dysphagia, oral phase: Secondary | ICD-10-CM | POA: Diagnosis not present

## 2016-08-06 DIAGNOSIS — G809 Cerebral palsy, unspecified: Secondary | ICD-10-CM | POA: Diagnosis not present

## 2016-08-06 DIAGNOSIS — M6281 Muscle weakness (generalized): Secondary | ICD-10-CM | POA: Diagnosis not present

## 2016-08-06 DIAGNOSIS — A419 Sepsis, unspecified organism: Secondary | ICD-10-CM | POA: Diagnosis not present

## 2016-08-07 DIAGNOSIS — M6281 Muscle weakness (generalized): Secondary | ICD-10-CM | POA: Diagnosis not present

## 2016-08-07 DIAGNOSIS — R2681 Unsteadiness on feet: Secondary | ICD-10-CM | POA: Diagnosis not present

## 2016-08-07 DIAGNOSIS — G809 Cerebral palsy, unspecified: Secondary | ICD-10-CM | POA: Diagnosis not present

## 2016-08-07 DIAGNOSIS — A419 Sepsis, unspecified organism: Secondary | ICD-10-CM | POA: Diagnosis not present

## 2016-08-07 DIAGNOSIS — R1311 Dysphagia, oral phase: Secondary | ICD-10-CM | POA: Diagnosis not present

## 2016-08-07 DIAGNOSIS — R278 Other lack of coordination: Secondary | ICD-10-CM | POA: Diagnosis not present

## 2016-08-08 DIAGNOSIS — F331 Major depressive disorder, recurrent, moderate: Secondary | ICD-10-CM | POA: Diagnosis not present

## 2016-08-08 DIAGNOSIS — A419 Sepsis, unspecified organism: Secondary | ICD-10-CM | POA: Diagnosis not present

## 2016-08-08 DIAGNOSIS — R2681 Unsteadiness on feet: Secondary | ICD-10-CM | POA: Diagnosis not present

## 2016-08-08 DIAGNOSIS — G809 Cerebral palsy, unspecified: Secondary | ICD-10-CM | POA: Diagnosis not present

## 2016-08-08 DIAGNOSIS — R1311 Dysphagia, oral phase: Secondary | ICD-10-CM | POA: Diagnosis not present

## 2016-08-08 DIAGNOSIS — R278 Other lack of coordination: Secondary | ICD-10-CM | POA: Diagnosis not present

## 2016-08-08 DIAGNOSIS — M6281 Muscle weakness (generalized): Secondary | ICD-10-CM | POA: Diagnosis not present

## 2016-08-09 DIAGNOSIS — R278 Other lack of coordination: Secondary | ICD-10-CM | POA: Diagnosis not present

## 2016-08-09 DIAGNOSIS — A419 Sepsis, unspecified organism: Secondary | ICD-10-CM | POA: Diagnosis not present

## 2016-08-09 DIAGNOSIS — R2681 Unsteadiness on feet: Secondary | ICD-10-CM | POA: Diagnosis not present

## 2016-08-09 DIAGNOSIS — G809 Cerebral palsy, unspecified: Secondary | ICD-10-CM | POA: Diagnosis not present

## 2016-08-09 DIAGNOSIS — R1311 Dysphagia, oral phase: Secondary | ICD-10-CM | POA: Diagnosis not present

## 2016-08-09 DIAGNOSIS — M6281 Muscle weakness (generalized): Secondary | ICD-10-CM | POA: Diagnosis not present

## 2016-08-10 DIAGNOSIS — R2681 Unsteadiness on feet: Secondary | ICD-10-CM | POA: Diagnosis not present

## 2016-08-10 DIAGNOSIS — A419 Sepsis, unspecified organism: Secondary | ICD-10-CM | POA: Diagnosis not present

## 2016-08-10 DIAGNOSIS — R278 Other lack of coordination: Secondary | ICD-10-CM | POA: Diagnosis not present

## 2016-08-10 DIAGNOSIS — M6281 Muscle weakness (generalized): Secondary | ICD-10-CM | POA: Diagnosis not present

## 2016-08-10 DIAGNOSIS — G809 Cerebral palsy, unspecified: Secondary | ICD-10-CM | POA: Diagnosis not present

## 2016-08-10 DIAGNOSIS — R1311 Dysphagia, oral phase: Secondary | ICD-10-CM | POA: Diagnosis not present

## 2016-08-11 IMAGING — CR DG CHEST 1V PORT
1 series · 1 of 1 positions shown · non-contrast
Comparison: Chest CT 09/24/2015

CLINICAL DATA: Concern for sepsis.

EXAM:
PORTABLE CHEST 1 VIEW

[AP]
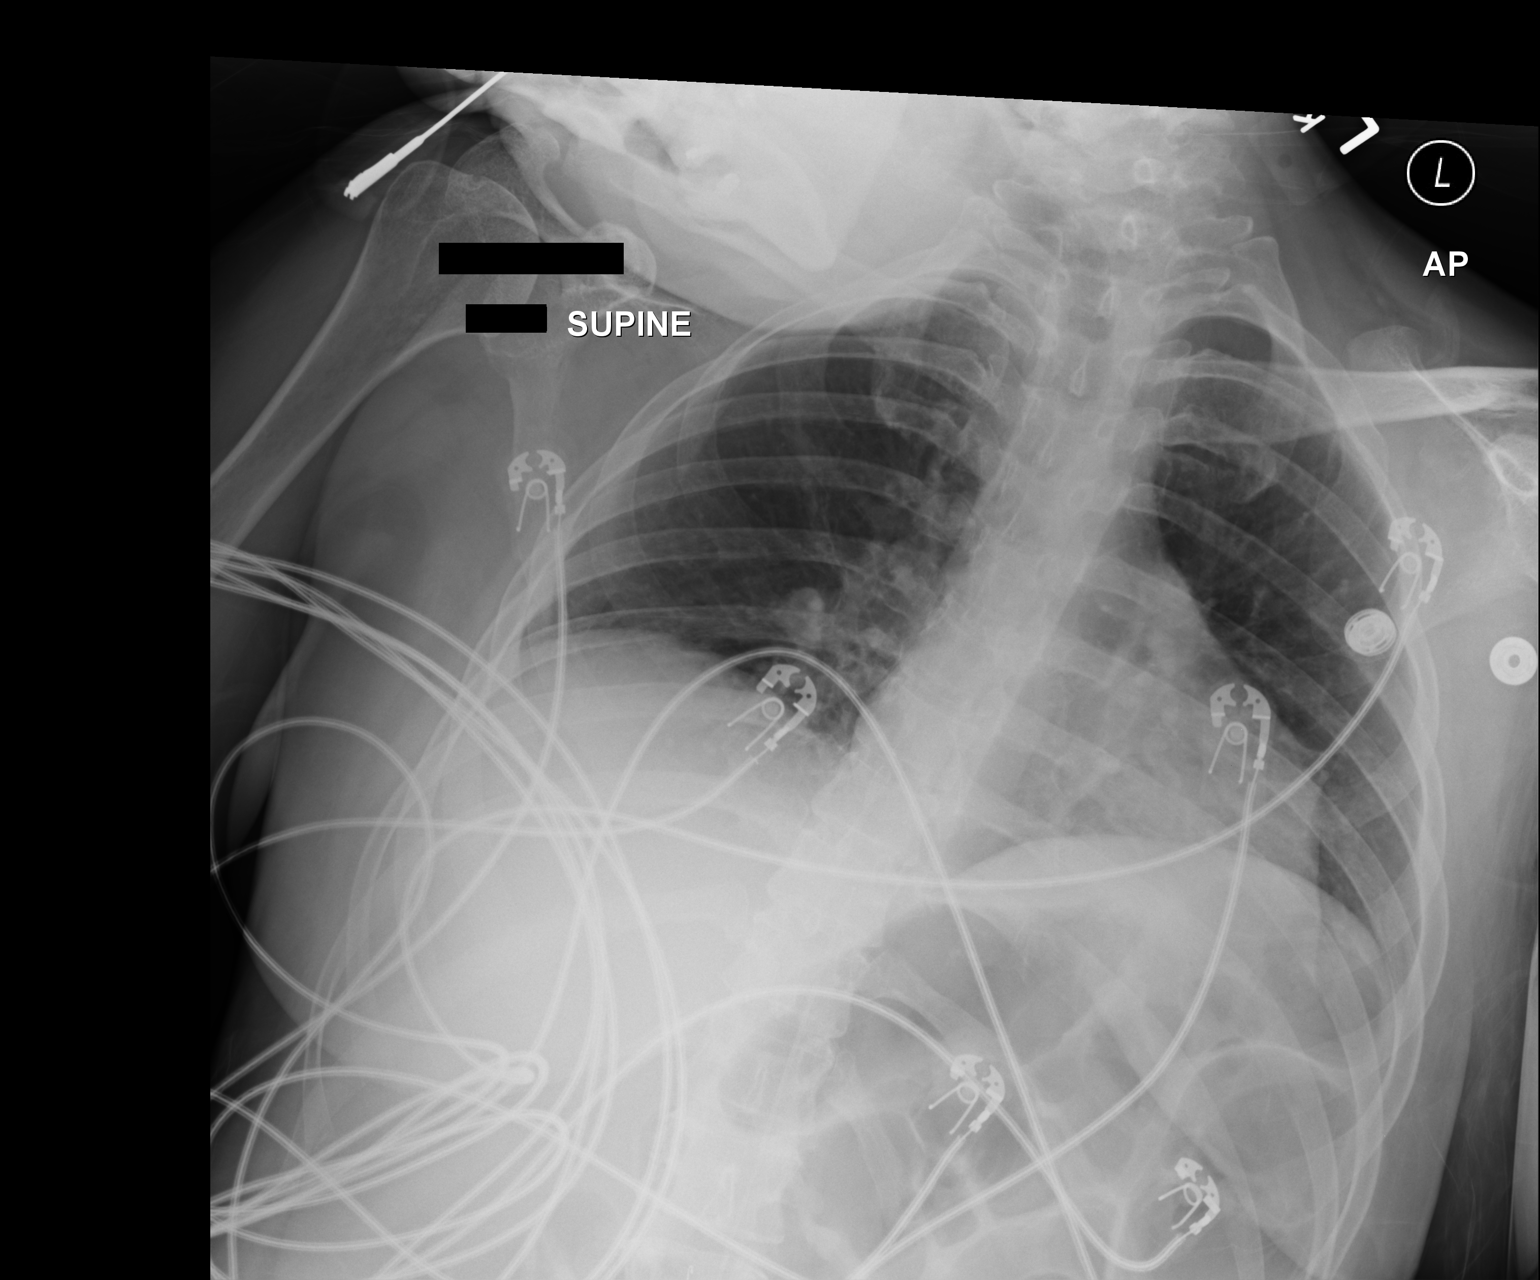

[1 of 1 positions shown; findings below may reference images not displayed]

FINDINGS: Chronically low lung volumes. There is no edema, consolidation,
effusion, or pneumothorax. Normal heart size and mediastinal
contours for technique.

No acute osseous finding.  Lower thoracic spinal catheter noted.
IMPRESSION: Negative for pneumonia.

## 2016-08-12 DIAGNOSIS — A419 Sepsis, unspecified organism: Secondary | ICD-10-CM | POA: Diagnosis not present

## 2016-08-12 DIAGNOSIS — G809 Cerebral palsy, unspecified: Secondary | ICD-10-CM | POA: Diagnosis not present

## 2016-08-12 DIAGNOSIS — M6281 Muscle weakness (generalized): Secondary | ICD-10-CM | POA: Diagnosis not present

## 2016-08-12 DIAGNOSIS — R2681 Unsteadiness on feet: Secondary | ICD-10-CM | POA: Diagnosis not present

## 2016-08-12 DIAGNOSIS — R278 Other lack of coordination: Secondary | ICD-10-CM | POA: Diagnosis not present

## 2016-08-12 DIAGNOSIS — R1311 Dysphagia, oral phase: Secondary | ICD-10-CM | POA: Diagnosis not present

## 2016-08-12 IMAGING — XA IR CATHETER TUBE CHANGE
1 series · 4 of 4 positions shown · non-contrast
Comparison: none

CLINICAL DATA: 39-year-old female with a history of suprapubic
catheter placement.

[Series 1: run · 4 of 4 slices shown]
[im 1/4]
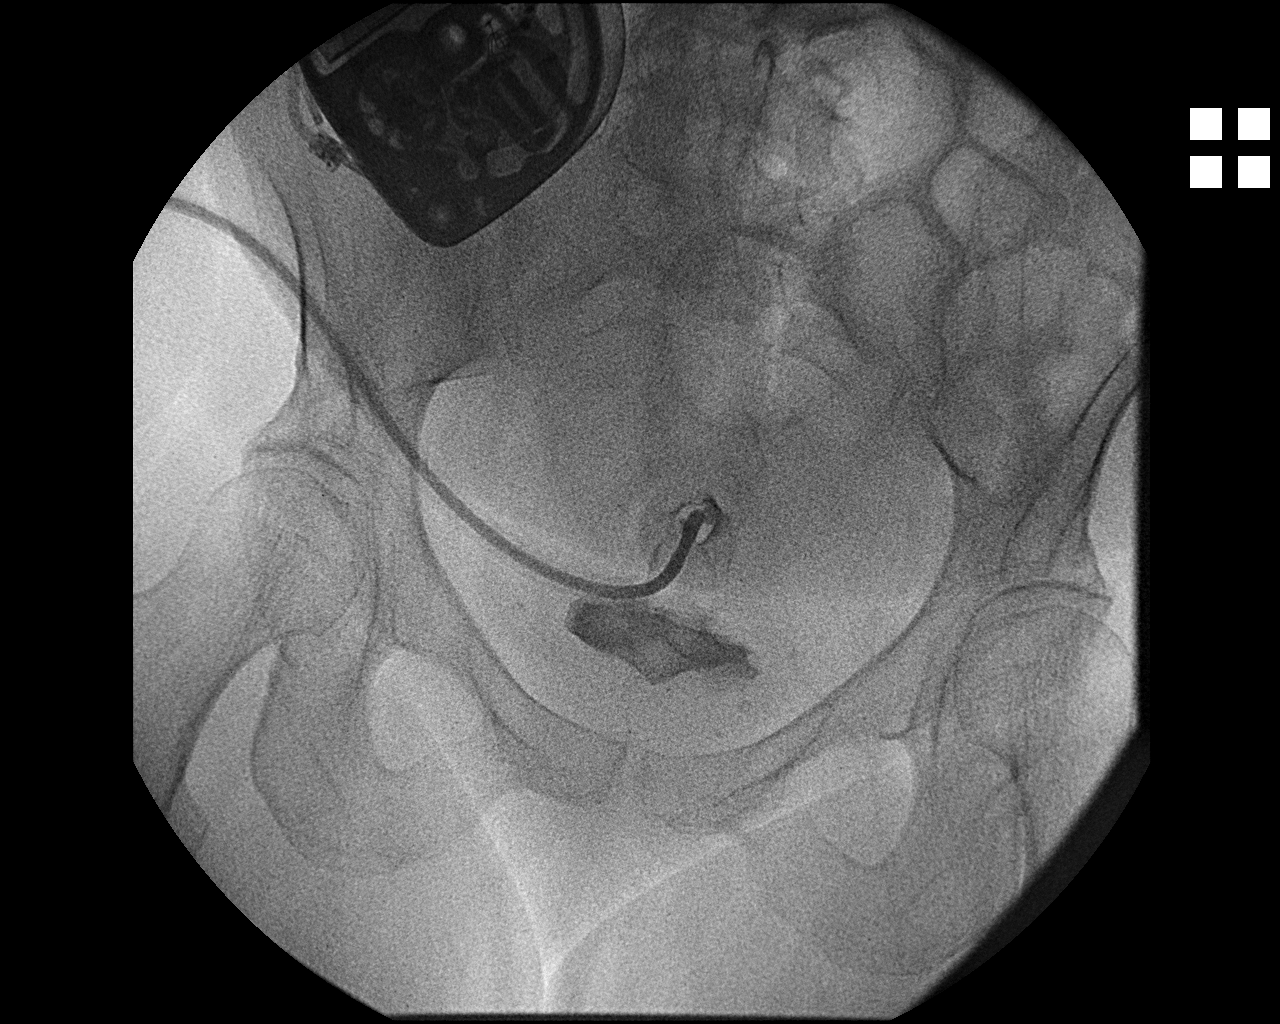
[im 2/4]
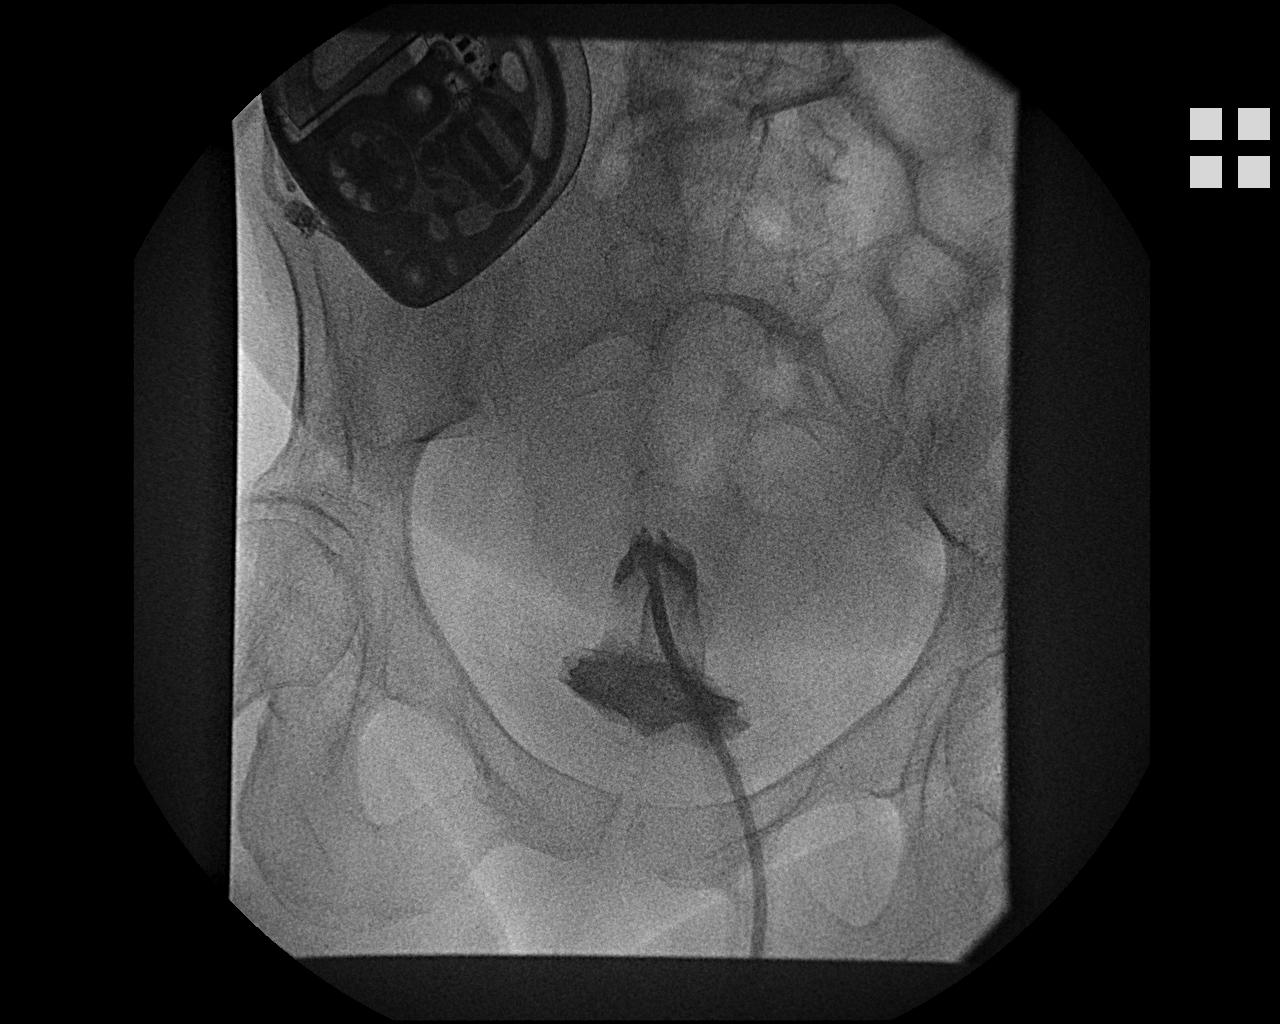
[im 3/4]
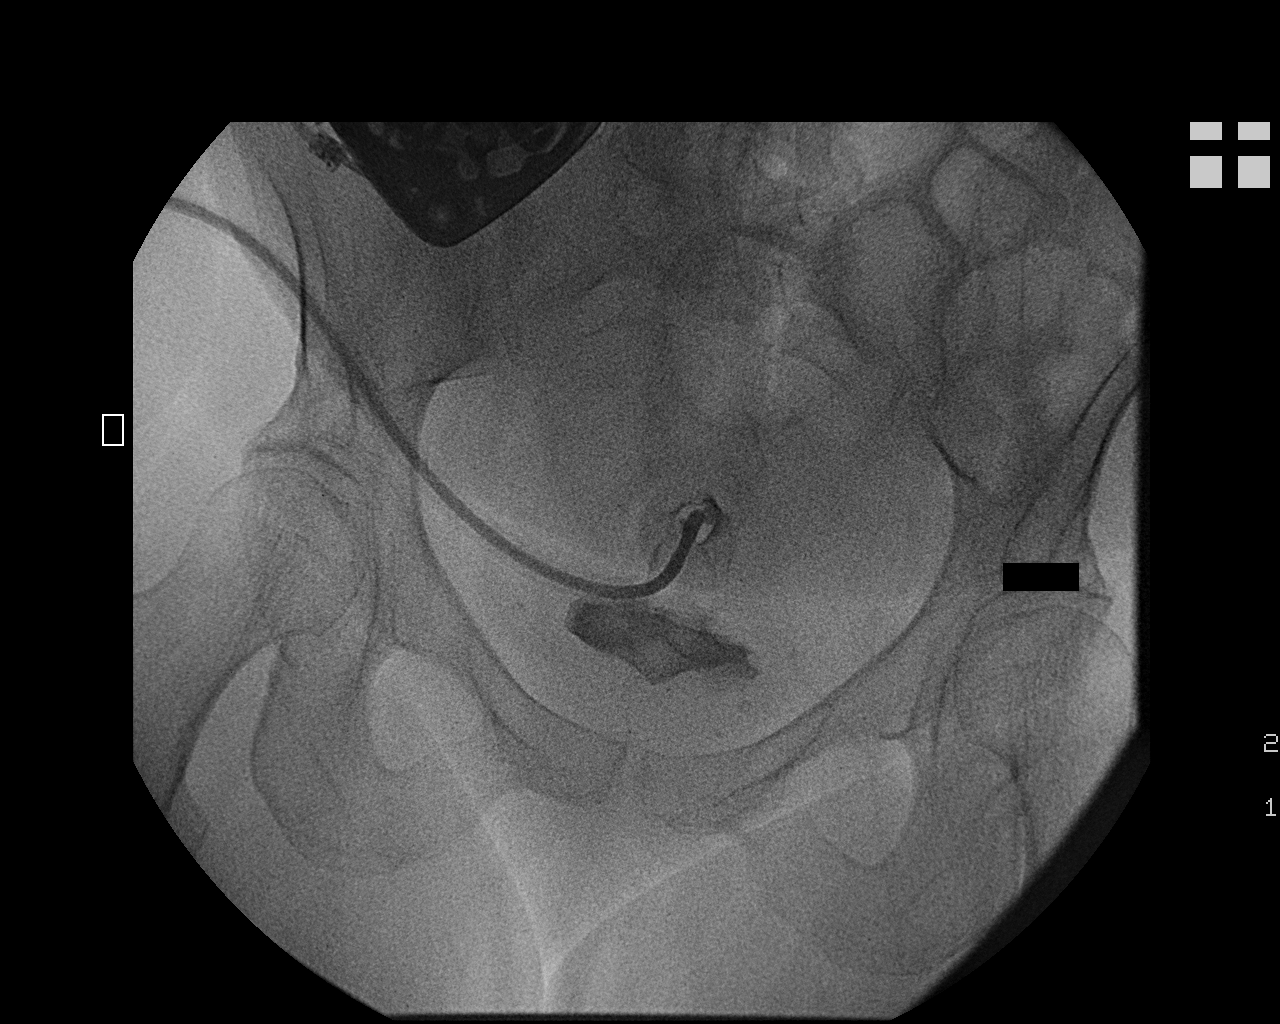
[im 4/4]
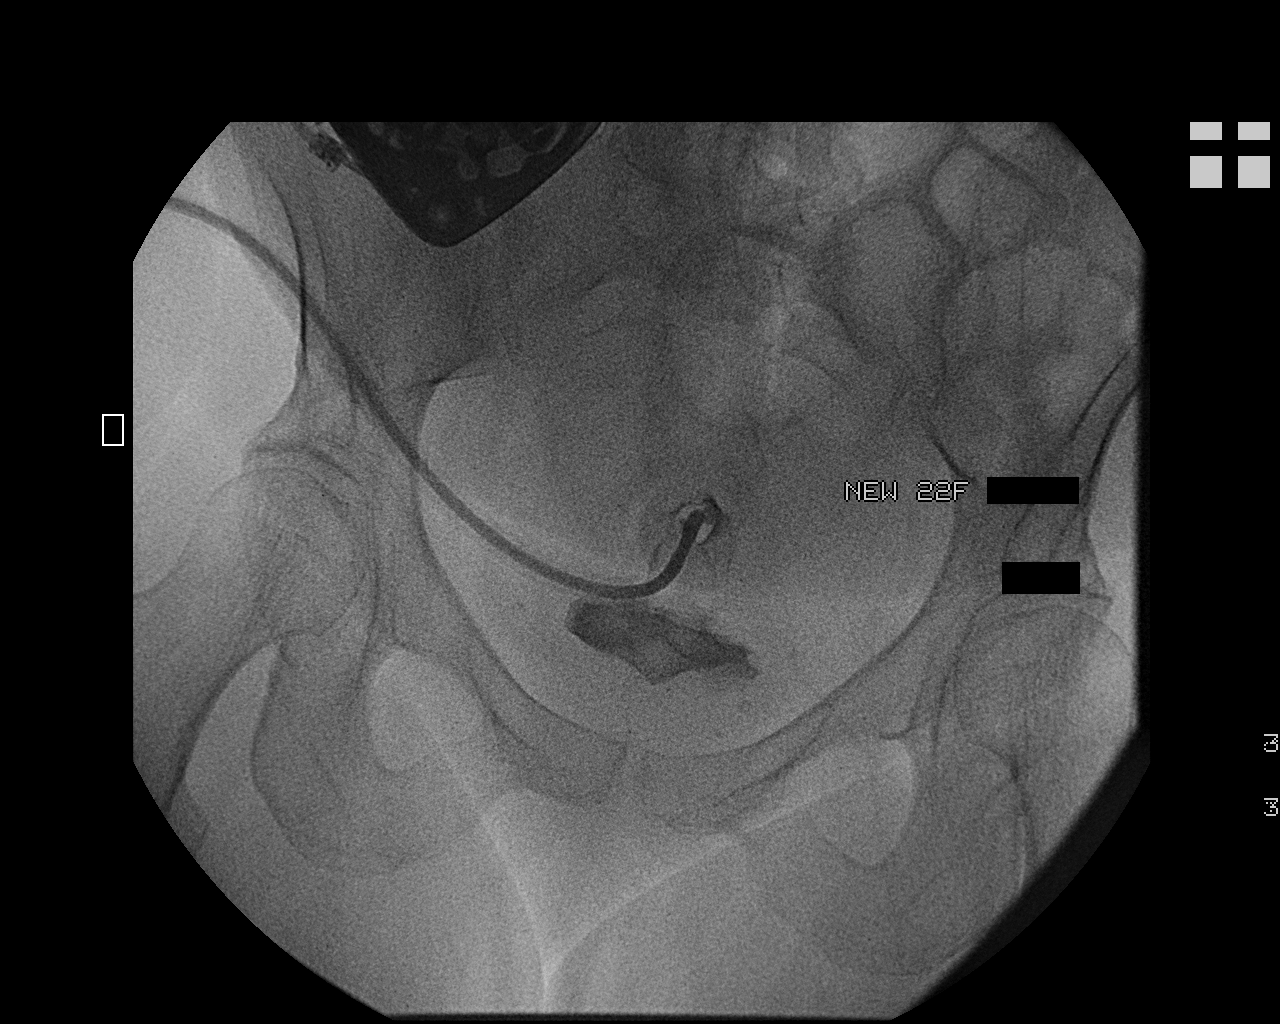

[4 of 4 positions shown; findings below may reference images not displayed]

The patient has been diagnosed with urinary tract infection and has
been referred for exchange.

EXAM:
IR CATHETER TUBE CHANGE

MEDICATIONS:
None

CONTRAST:  Small amount of contrast through the existing and
exchanged tube

FLUOROSCOPY TIME:  Fluoroscopy Time (in minutes and seconds): 6
seconds

PROCEDURE:
The procedure, risks, benefits, and alternatives were explained to
the patient. Questions regarding the procedure were encouraged and
answered. The patient understands and consents to the procedure.

Patient positioned supine position an the indwelling catheter were
prepped and draped in the usual sterile fashion with Betadine. A
sterile drape was applied covering the operative field. A sterile
gown and sterile gloves were used for the procedure.

Small amount of contrast was infused through the indwelling catheter
to confirm position within the urinary bladder. Balloon was deflated
and the catheter was removed.

A new 22 French Foley catheter was inserted through the ostomy into
the urinary bladder. The balloon was inflated with 10 cc of saline.
Injection of contrast confirmed position of the new catheter.

Patient tolerated the procedure well and remained hemodynamically
stable throughout.

No complications were encountered and no significant blood loss was
encounter

COMPLICATIONS:
None
FINDINGS: Indwelling catheter position within the urinary bladder.

After exchange of the new 22 French catheter, we confirmed position
with injection.
IMPRESSION: Status post fluoroscopic guided exchange of suprapubic catheter with
a new 22 French balloon retention Foley catheter.

## 2016-08-13 DIAGNOSIS — R1311 Dysphagia, oral phase: Secondary | ICD-10-CM | POA: Diagnosis not present

## 2016-08-13 DIAGNOSIS — R278 Other lack of coordination: Secondary | ICD-10-CM | POA: Diagnosis not present

## 2016-08-13 DIAGNOSIS — R2681 Unsteadiness on feet: Secondary | ICD-10-CM | POA: Diagnosis not present

## 2016-08-13 DIAGNOSIS — G809 Cerebral palsy, unspecified: Secondary | ICD-10-CM | POA: Diagnosis not present

## 2016-08-13 DIAGNOSIS — M6281 Muscle weakness (generalized): Secondary | ICD-10-CM | POA: Diagnosis not present

## 2016-08-13 DIAGNOSIS — A419 Sepsis, unspecified organism: Secondary | ICD-10-CM | POA: Diagnosis not present

## 2016-08-14 DIAGNOSIS — M25561 Pain in right knee: Secondary | ICD-10-CM | POA: Diagnosis not present

## 2016-08-14 DIAGNOSIS — A419 Sepsis, unspecified organism: Secondary | ICD-10-CM | POA: Diagnosis not present

## 2016-08-14 DIAGNOSIS — G809 Cerebral palsy, unspecified: Secondary | ICD-10-CM | POA: Diagnosis not present

## 2016-08-14 DIAGNOSIS — R1311 Dysphagia, oral phase: Secondary | ICD-10-CM | POA: Diagnosis not present

## 2016-08-14 DIAGNOSIS — R2681 Unsteadiness on feet: Secondary | ICD-10-CM | POA: Diagnosis not present

## 2016-08-14 DIAGNOSIS — R278 Other lack of coordination: Secondary | ICD-10-CM | POA: Diagnosis not present

## 2016-08-14 DIAGNOSIS — M25562 Pain in left knee: Secondary | ICD-10-CM | POA: Diagnosis not present

## 2016-08-14 DIAGNOSIS — M6281 Muscle weakness (generalized): Secondary | ICD-10-CM | POA: Diagnosis not present

## 2016-08-15 DIAGNOSIS — R2681 Unsteadiness on feet: Secondary | ICD-10-CM | POA: Diagnosis not present

## 2016-08-15 DIAGNOSIS — M6281 Muscle weakness (generalized): Secondary | ICD-10-CM | POA: Diagnosis not present

## 2016-08-15 DIAGNOSIS — A419 Sepsis, unspecified organism: Secondary | ICD-10-CM | POA: Diagnosis not present

## 2016-08-15 DIAGNOSIS — R1311 Dysphagia, oral phase: Secondary | ICD-10-CM | POA: Diagnosis not present

## 2016-08-15 DIAGNOSIS — R278 Other lack of coordination: Secondary | ICD-10-CM | POA: Diagnosis not present

## 2016-08-15 DIAGNOSIS — G809 Cerebral palsy, unspecified: Secondary | ICD-10-CM | POA: Diagnosis not present

## 2016-08-16 DIAGNOSIS — K219 Gastro-esophageal reflux disease without esophagitis: Secondary | ICD-10-CM | POA: Diagnosis not present

## 2016-08-16 DIAGNOSIS — G809 Cerebral palsy, unspecified: Secondary | ICD-10-CM | POA: Diagnosis not present

## 2016-08-16 DIAGNOSIS — R350 Frequency of micturition: Secondary | ICD-10-CM | POA: Diagnosis not present

## 2016-08-16 DIAGNOSIS — M542 Cervicalgia: Secondary | ICD-10-CM | POA: Diagnosis not present

## 2016-08-16 DIAGNOSIS — Z86711 Personal history of pulmonary embolism: Secondary | ICD-10-CM | POA: Diagnosis not present

## 2016-08-16 DIAGNOSIS — M545 Low back pain: Secondary | ICD-10-CM | POA: Diagnosis not present

## 2016-08-16 DIAGNOSIS — N319 Neuromuscular dysfunction of bladder, unspecified: Secondary | ICD-10-CM | POA: Diagnosis not present

## 2016-08-16 DIAGNOSIS — R32 Unspecified urinary incontinence: Secondary | ICD-10-CM | POA: Diagnosis not present

## 2016-08-16 DIAGNOSIS — G249 Dystonia, unspecified: Secondary | ICD-10-CM | POA: Diagnosis not present

## 2016-08-16 DIAGNOSIS — Z9049 Acquired absence of other specified parts of digestive tract: Secondary | ICD-10-CM | POA: Diagnosis not present

## 2016-08-16 DIAGNOSIS — Z7901 Long term (current) use of anticoagulants: Secondary | ICD-10-CM | POA: Diagnosis not present

## 2016-08-16 DIAGNOSIS — N139 Obstructive and reflux uropathy, unspecified: Secondary | ICD-10-CM | POA: Diagnosis not present

## 2016-08-16 DIAGNOSIS — G894 Chronic pain syndrome: Secondary | ICD-10-CM | POA: Diagnosis not present

## 2016-08-16 DIAGNOSIS — Z79899 Other long term (current) drug therapy: Secondary | ICD-10-CM | POA: Diagnosis not present

## 2016-08-16 DIAGNOSIS — R569 Unspecified convulsions: Secondary | ICD-10-CM | POA: Diagnosis not present

## 2016-08-16 DIAGNOSIS — Z975 Presence of (intrauterine) contraceptive device: Secondary | ICD-10-CM | POA: Diagnosis not present

## 2016-08-16 DIAGNOSIS — G800 Spastic quadriplegic cerebral palsy: Secondary | ICD-10-CM | POA: Diagnosis not present

## 2016-08-16 DIAGNOSIS — F419 Anxiety disorder, unspecified: Secondary | ICD-10-CM | POA: Diagnosis not present

## 2016-08-16 DIAGNOSIS — I10 Essential (primary) hypertension: Secondary | ICD-10-CM | POA: Diagnosis not present

## 2016-08-16 DIAGNOSIS — Z90710 Acquired absence of both cervix and uterus: Secondary | ICD-10-CM | POA: Diagnosis not present

## 2016-08-16 DIAGNOSIS — Z86718 Personal history of other venous thrombosis and embolism: Secondary | ICD-10-CM | POA: Diagnosis not present

## 2016-08-17 DIAGNOSIS — R1311 Dysphagia, oral phase: Secondary | ICD-10-CM | POA: Diagnosis not present

## 2016-08-17 DIAGNOSIS — M6281 Muscle weakness (generalized): Secondary | ICD-10-CM | POA: Diagnosis not present

## 2016-08-17 DIAGNOSIS — R2681 Unsteadiness on feet: Secondary | ICD-10-CM | POA: Diagnosis not present

## 2016-08-17 DIAGNOSIS — K219 Gastro-esophageal reflux disease without esophagitis: Secondary | ICD-10-CM | POA: Diagnosis not present

## 2016-08-17 DIAGNOSIS — G249 Dystonia, unspecified: Secondary | ICD-10-CM | POA: Diagnosis not present

## 2016-08-17 DIAGNOSIS — G809 Cerebral palsy, unspecified: Secondary | ICD-10-CM | POA: Diagnosis not present

## 2016-08-17 DIAGNOSIS — G800 Spastic quadriplegic cerebral palsy: Secondary | ICD-10-CM | POA: Diagnosis not present

## 2016-08-17 DIAGNOSIS — A419 Sepsis, unspecified organism: Secondary | ICD-10-CM | POA: Diagnosis not present

## 2016-08-17 DIAGNOSIS — I10 Essential (primary) hypertension: Secondary | ICD-10-CM | POA: Diagnosis not present

## 2016-08-17 DIAGNOSIS — F419 Anxiety disorder, unspecified: Secondary | ICD-10-CM | POA: Diagnosis not present

## 2016-08-17 DIAGNOSIS — N319 Neuromuscular dysfunction of bladder, unspecified: Secondary | ICD-10-CM | POA: Diagnosis not present

## 2016-08-17 DIAGNOSIS — R278 Other lack of coordination: Secondary | ICD-10-CM | POA: Diagnosis not present

## 2016-08-19 ENCOUNTER — Ambulatory Visit: Payer: Medicare Other | Admitting: Physical Medicine & Rehabilitation

## 2016-08-19 DIAGNOSIS — R1311 Dysphagia, oral phase: Secondary | ICD-10-CM | POA: Diagnosis not present

## 2016-08-19 DIAGNOSIS — R2681 Unsteadiness on feet: Secondary | ICD-10-CM | POA: Diagnosis not present

## 2016-08-19 DIAGNOSIS — A419 Sepsis, unspecified organism: Secondary | ICD-10-CM | POA: Diagnosis not present

## 2016-08-19 DIAGNOSIS — G809 Cerebral palsy, unspecified: Secondary | ICD-10-CM | POA: Diagnosis not present

## 2016-08-19 DIAGNOSIS — M6281 Muscle weakness (generalized): Secondary | ICD-10-CM | POA: Diagnosis not present

## 2016-08-19 DIAGNOSIS — R278 Other lack of coordination: Secondary | ICD-10-CM | POA: Diagnosis not present

## 2016-08-20 DIAGNOSIS — G809 Cerebral palsy, unspecified: Secondary | ICD-10-CM | POA: Diagnosis not present

## 2016-08-20 DIAGNOSIS — R278 Other lack of coordination: Secondary | ICD-10-CM | POA: Diagnosis not present

## 2016-08-20 DIAGNOSIS — R1311 Dysphagia, oral phase: Secondary | ICD-10-CM | POA: Diagnosis not present

## 2016-08-20 DIAGNOSIS — M6281 Muscle weakness (generalized): Secondary | ICD-10-CM | POA: Diagnosis not present

## 2016-08-20 DIAGNOSIS — R2681 Unsteadiness on feet: Secondary | ICD-10-CM | POA: Diagnosis not present

## 2016-08-20 DIAGNOSIS — A419 Sepsis, unspecified organism: Secondary | ICD-10-CM | POA: Diagnosis not present

## 2016-08-21 DIAGNOSIS — G809 Cerebral palsy, unspecified: Secondary | ICD-10-CM | POA: Diagnosis not present

## 2016-08-21 DIAGNOSIS — R1311 Dysphagia, oral phase: Secondary | ICD-10-CM | POA: Diagnosis not present

## 2016-08-21 DIAGNOSIS — R2681 Unsteadiness on feet: Secondary | ICD-10-CM | POA: Diagnosis not present

## 2016-08-21 DIAGNOSIS — N318 Other neuromuscular dysfunction of bladder: Secondary | ICD-10-CM | POA: Diagnosis not present

## 2016-08-21 DIAGNOSIS — A419 Sepsis, unspecified organism: Secondary | ICD-10-CM | POA: Diagnosis not present

## 2016-08-21 DIAGNOSIS — R278 Other lack of coordination: Secondary | ICD-10-CM | POA: Diagnosis not present

## 2016-08-21 DIAGNOSIS — M6281 Muscle weakness (generalized): Secondary | ICD-10-CM | POA: Diagnosis not present

## 2016-08-22 DIAGNOSIS — A419 Sepsis, unspecified organism: Secondary | ICD-10-CM | POA: Diagnosis not present

## 2016-08-22 DIAGNOSIS — R1311 Dysphagia, oral phase: Secondary | ICD-10-CM | POA: Diagnosis not present

## 2016-08-22 DIAGNOSIS — R2681 Unsteadiness on feet: Secondary | ICD-10-CM | POA: Diagnosis not present

## 2016-08-22 DIAGNOSIS — M6281 Muscle weakness (generalized): Secondary | ICD-10-CM | POA: Diagnosis not present

## 2016-08-22 DIAGNOSIS — G809 Cerebral palsy, unspecified: Secondary | ICD-10-CM | POA: Diagnosis not present

## 2016-08-22 DIAGNOSIS — R278 Other lack of coordination: Secondary | ICD-10-CM | POA: Diagnosis not present

## 2016-08-23 DIAGNOSIS — G809 Cerebral palsy, unspecified: Secondary | ICD-10-CM | POA: Diagnosis not present

## 2016-08-23 DIAGNOSIS — R2681 Unsteadiness on feet: Secondary | ICD-10-CM | POA: Diagnosis not present

## 2016-08-23 DIAGNOSIS — R1311 Dysphagia, oral phase: Secondary | ICD-10-CM | POA: Diagnosis not present

## 2016-08-23 DIAGNOSIS — M6281 Muscle weakness (generalized): Secondary | ICD-10-CM | POA: Diagnosis not present

## 2016-08-23 DIAGNOSIS — A419 Sepsis, unspecified organism: Secondary | ICD-10-CM | POA: Diagnosis not present

## 2016-08-23 DIAGNOSIS — I10 Essential (primary) hypertension: Secondary | ICD-10-CM | POA: Diagnosis not present

## 2016-08-23 DIAGNOSIS — R278 Other lack of coordination: Secondary | ICD-10-CM | POA: Diagnosis not present

## 2016-08-24 DIAGNOSIS — G809 Cerebral palsy, unspecified: Secondary | ICD-10-CM | POA: Diagnosis not present

## 2016-08-24 DIAGNOSIS — R2681 Unsteadiness on feet: Secondary | ICD-10-CM | POA: Diagnosis not present

## 2016-08-24 DIAGNOSIS — R1311 Dysphagia, oral phase: Secondary | ICD-10-CM | POA: Diagnosis not present

## 2016-08-24 DIAGNOSIS — R278 Other lack of coordination: Secondary | ICD-10-CM | POA: Diagnosis not present

## 2016-08-24 DIAGNOSIS — M6281 Muscle weakness (generalized): Secondary | ICD-10-CM | POA: Diagnosis not present

## 2016-08-24 DIAGNOSIS — A419 Sepsis, unspecified organism: Secondary | ICD-10-CM | POA: Diagnosis not present

## 2016-08-26 DIAGNOSIS — R1311 Dysphagia, oral phase: Secondary | ICD-10-CM | POA: Diagnosis not present

## 2016-08-26 DIAGNOSIS — G809 Cerebral palsy, unspecified: Secondary | ICD-10-CM | POA: Diagnosis not present

## 2016-08-26 DIAGNOSIS — M6281 Muscle weakness (generalized): Secondary | ICD-10-CM | POA: Diagnosis not present

## 2016-08-26 DIAGNOSIS — R278 Other lack of coordination: Secondary | ICD-10-CM | POA: Diagnosis not present

## 2016-08-26 DIAGNOSIS — R2681 Unsteadiness on feet: Secondary | ICD-10-CM | POA: Diagnosis not present

## 2016-08-26 DIAGNOSIS — A419 Sepsis, unspecified organism: Secondary | ICD-10-CM | POA: Diagnosis not present

## 2016-08-27 DIAGNOSIS — G809 Cerebral palsy, unspecified: Secondary | ICD-10-CM | POA: Diagnosis not present

## 2016-08-27 DIAGNOSIS — R2681 Unsteadiness on feet: Secondary | ICD-10-CM | POA: Diagnosis not present

## 2016-08-27 DIAGNOSIS — M6281 Muscle weakness (generalized): Secondary | ICD-10-CM | POA: Diagnosis not present

## 2016-08-27 DIAGNOSIS — A419 Sepsis, unspecified organism: Secondary | ICD-10-CM | POA: Diagnosis not present

## 2016-08-27 DIAGNOSIS — R278 Other lack of coordination: Secondary | ICD-10-CM | POA: Diagnosis not present

## 2016-08-27 DIAGNOSIS — R1311 Dysphagia, oral phase: Secondary | ICD-10-CM | POA: Diagnosis not present

## 2016-08-28 DIAGNOSIS — M6281 Muscle weakness (generalized): Secondary | ICD-10-CM | POA: Diagnosis not present

## 2016-08-28 DIAGNOSIS — R278 Other lack of coordination: Secondary | ICD-10-CM | POA: Diagnosis not present

## 2016-08-28 DIAGNOSIS — A419 Sepsis, unspecified organism: Secondary | ICD-10-CM | POA: Diagnosis not present

## 2016-08-28 DIAGNOSIS — R1311 Dysphagia, oral phase: Secondary | ICD-10-CM | POA: Diagnosis not present

## 2016-08-28 DIAGNOSIS — G809 Cerebral palsy, unspecified: Secondary | ICD-10-CM | POA: Diagnosis not present

## 2016-08-28 DIAGNOSIS — K5909 Other constipation: Secondary | ICD-10-CM | POA: Diagnosis not present

## 2016-08-28 DIAGNOSIS — R2681 Unsteadiness on feet: Secondary | ICD-10-CM | POA: Diagnosis not present

## 2016-08-29 DIAGNOSIS — G809 Cerebral palsy, unspecified: Secondary | ICD-10-CM | POA: Diagnosis not present

## 2016-08-29 DIAGNOSIS — M6281 Muscle weakness (generalized): Secondary | ICD-10-CM | POA: Diagnosis not present

## 2016-08-29 DIAGNOSIS — A419 Sepsis, unspecified organism: Secondary | ICD-10-CM | POA: Diagnosis not present

## 2016-08-29 DIAGNOSIS — R278 Other lack of coordination: Secondary | ICD-10-CM | POA: Diagnosis not present

## 2016-08-29 DIAGNOSIS — R2681 Unsteadiness on feet: Secondary | ICD-10-CM | POA: Diagnosis not present

## 2016-08-29 DIAGNOSIS — R1311 Dysphagia, oral phase: Secondary | ICD-10-CM | POA: Diagnosis not present

## 2016-08-30 DIAGNOSIS — R1311 Dysphagia, oral phase: Secondary | ICD-10-CM | POA: Diagnosis not present

## 2016-08-30 DIAGNOSIS — R278 Other lack of coordination: Secondary | ICD-10-CM | POA: Diagnosis not present

## 2016-08-30 DIAGNOSIS — R2681 Unsteadiness on feet: Secondary | ICD-10-CM | POA: Diagnosis not present

## 2016-08-30 DIAGNOSIS — M6281 Muscle weakness (generalized): Secondary | ICD-10-CM | POA: Diagnosis not present

## 2016-08-30 DIAGNOSIS — G809 Cerebral palsy, unspecified: Secondary | ICD-10-CM | POA: Diagnosis not present

## 2016-08-30 DIAGNOSIS — A419 Sepsis, unspecified organism: Secondary | ICD-10-CM | POA: Diagnosis not present

## 2016-08-30 IMAGING — CT CT ABD-PELV W/ CM
2 of 6 series · 3 of 46 positions shown, 4 images · IV contrast (Iodine)
Comparison: Multiple recent prior studies

CLINICAL DATA: Fever of unknown origin, history of recurrent
pulmonary embolism, right upper quadrant pain with cholecystectomy
10/14/2015, fungus in urine, suprapubic tenderness

EXAM:
CT CHEST, ABDOMEN, AND PELVIS WITH CONTRAST
TECHNIQUE: Multidetector CT imaging of the chest, abdomen and pelvis was
performed following the standard protocol during bolus
administration of intravenous contrast.
CONTRAST:  50mL OMNIPAQUE IOHEXOL 300 MG/ML  SOLN

[Series 204: coronal · coronal · 0.45mm/px · 2 of 105 slices shown, 3 images]
[im 35/105  soft-tissue]
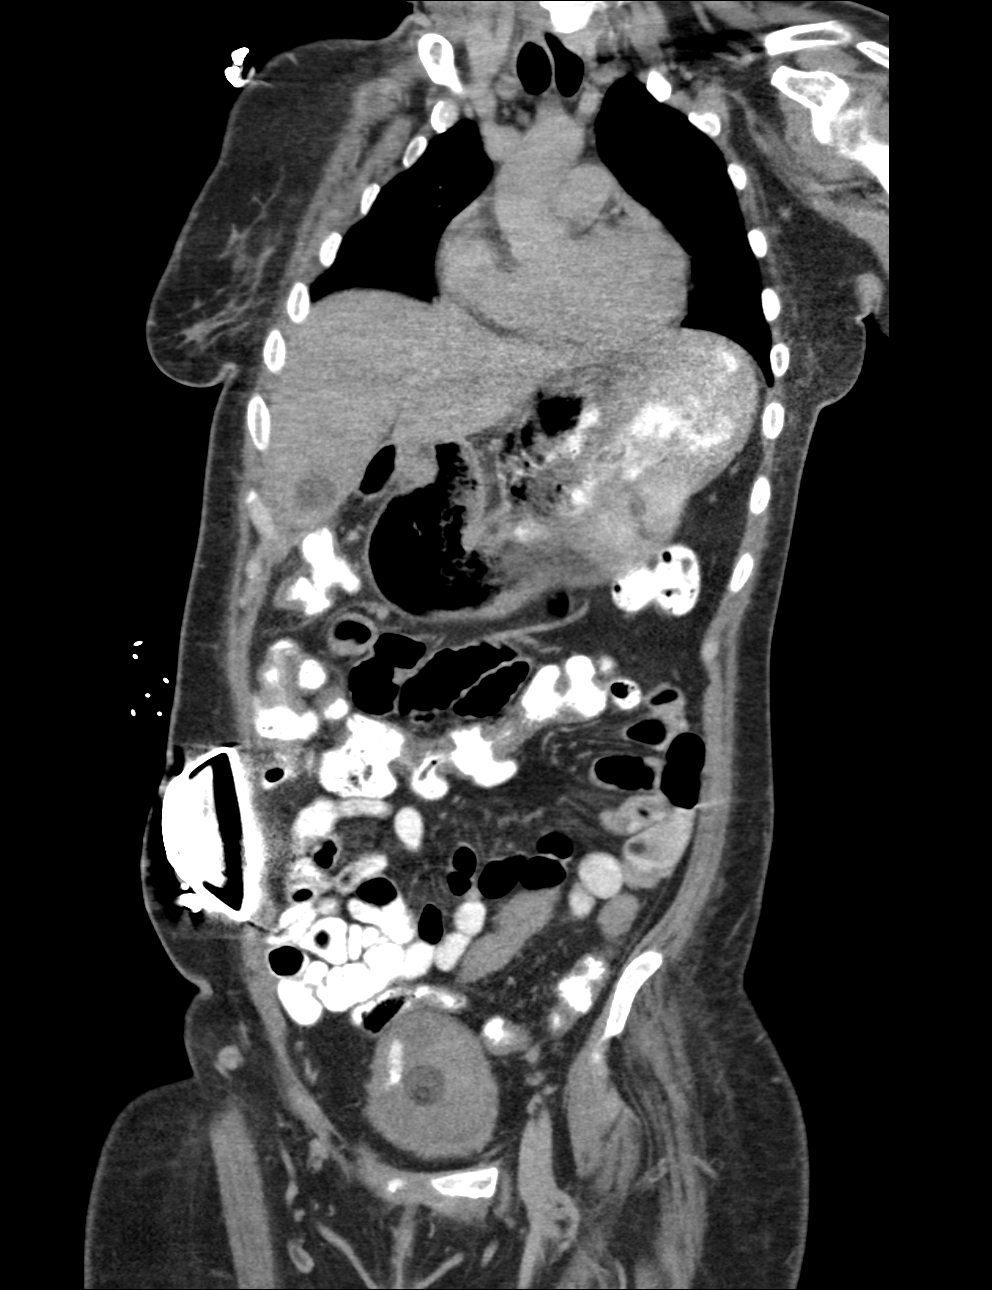
[im 35/105  bone]
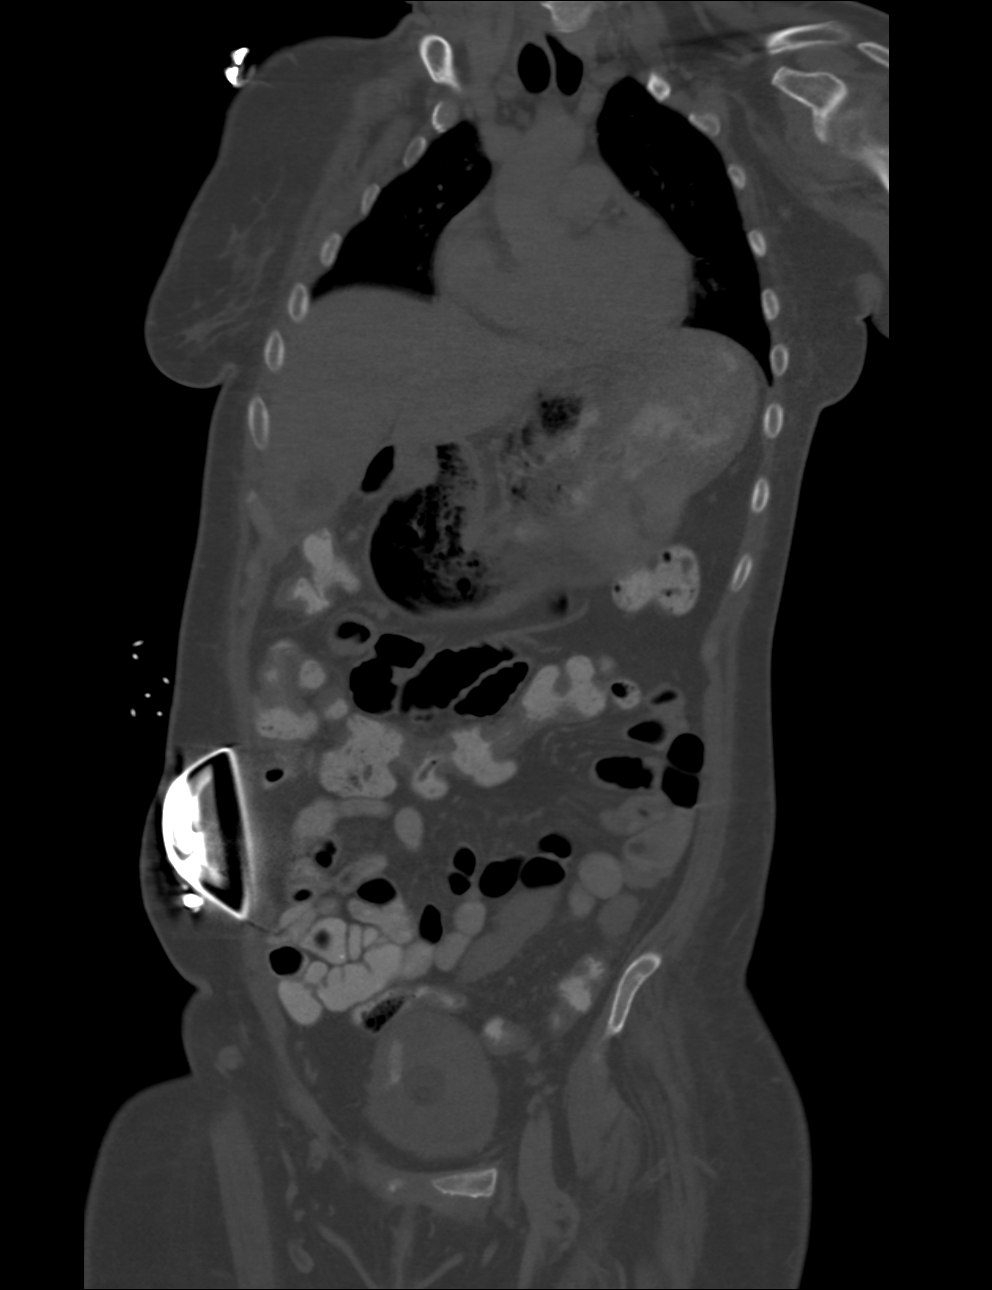
[im 81/105  soft-tissue]
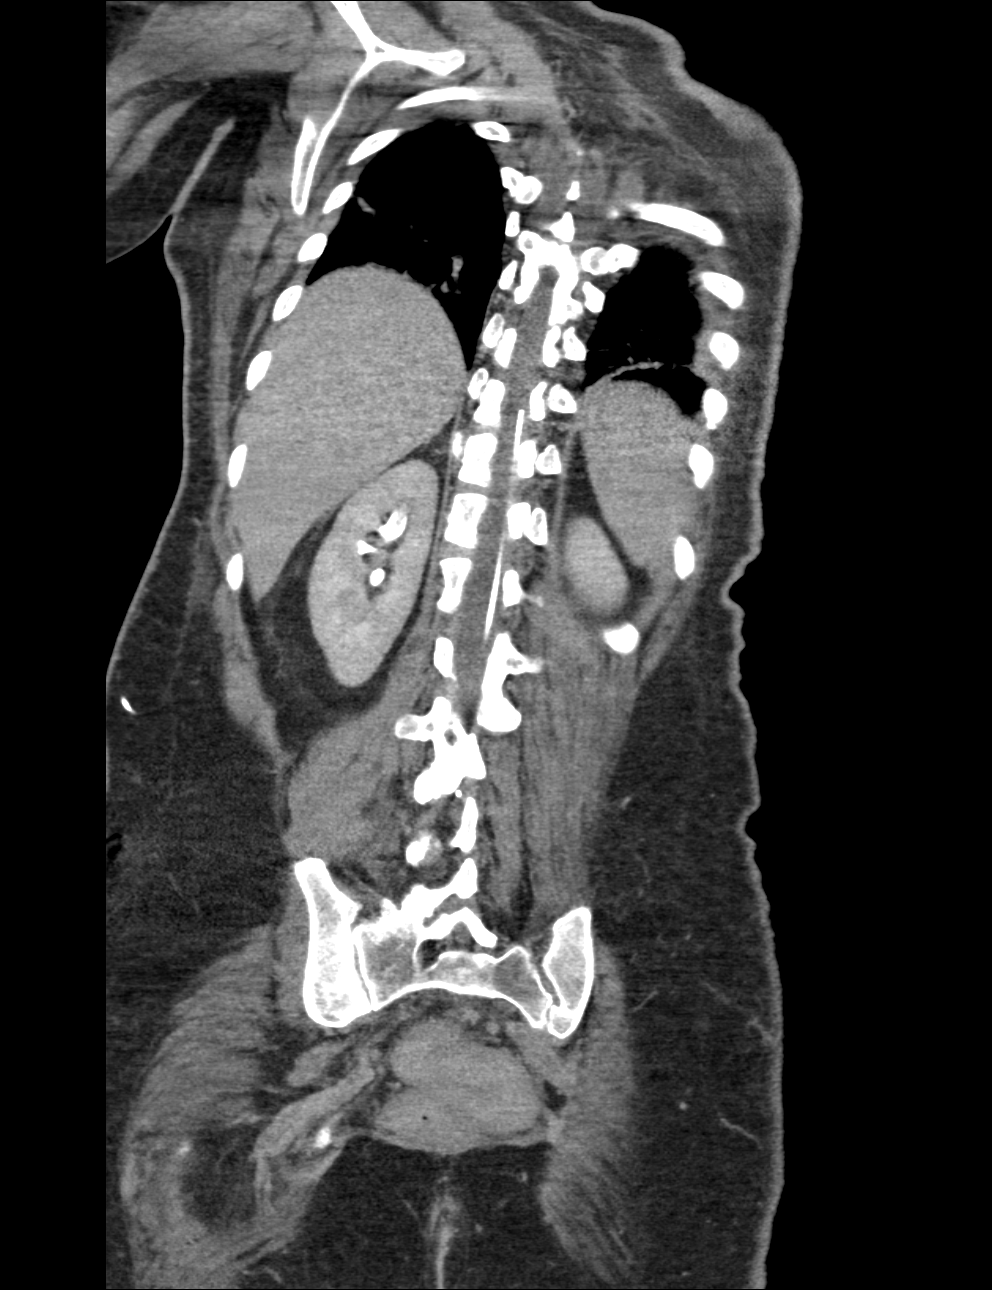

[Series 205: sagittal · sagittal · 0.45mm/px · 1 of 148 slices shown]
[im 50/148  soft-tissue]
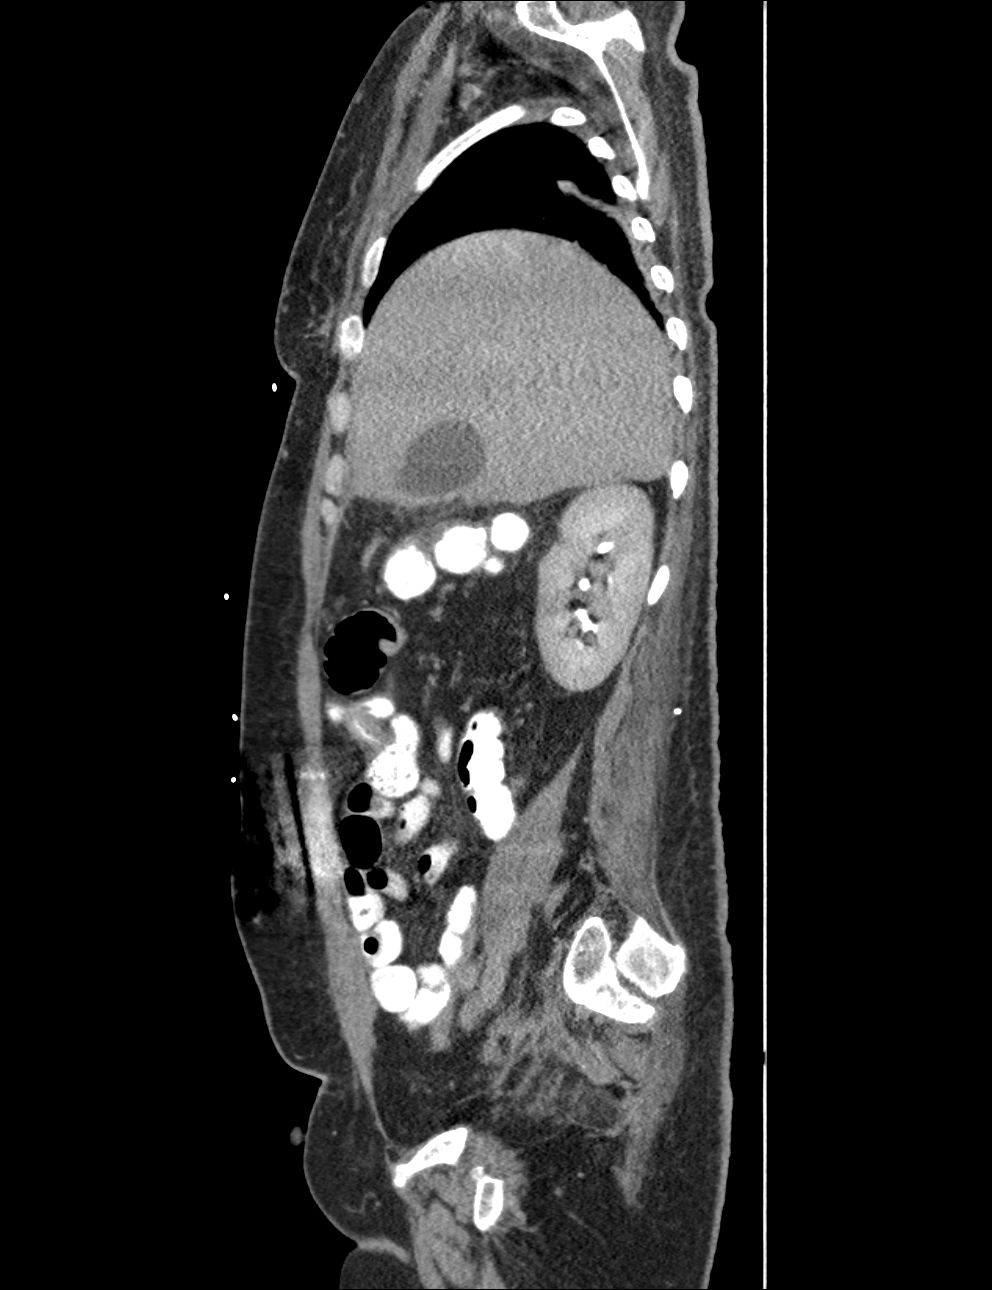

[3 of 46 positions shown; findings below may reference images not displayed]

FINDINGS: CT CHEST FINDINGS

Mediastinum/Lymph Nodes: There is only limited evaluation, as
reduced dose of contrast given by hand injection leads to no
affected contrast within the thorax at the time of imaging. No
pericardial effusion. No obvious adenopathy. Subjectively there
appears to be fullness of the left thyroid gland as previously
described suggesting possibility of thyroid mass.

Lungs/Pleura: Bilateral subsegmental atelectasis. Study limited by
respiratory motion artifact. No pleural effusion.

Musculoskeletal: No acute findings

CT ABDOMEN PELVIS FINDINGS

Hepatobiliary: postsurgical clips in the gallbladder fossa. 7 x 4 cm
fluid collection in the anticipated position of the gallbladder
postoperatively, of unknown sterility.

Pancreas: Negative

Spleen: Negative

Adrenals/Urinary Tract: Adrenal glands are negative. No
hydronephrosis. Right kidney is normal. 2 cm left renal cyst
midpole. Suprapubic catheter in the bladder. Diffuse bladder wall
thickening.

Stomach/Bowel: Nonobstructive bowel gas pattern. No evidence of
focal bowel abnormality. Sigmoid colon not evaluated well as it is
decompressed and without contrast. Cannot exclude possibility of
wall thickening involving distal sigmoid and rectum.

Vascular/Lymphatic: Inferior vena cava filter noted below the level
of the renal veins.

Reproductive: 15 mm cystic lesion right adnexa likely an ovarian
cyst. 4 x 1.5 cm fluid collection posterior to the bladder similar
to prior study.

Other: Epidural lead noted. Right lower quadrant spinal stimulator
noted.

Musculoskeletal:  No acute findings
IMPRESSION: Limited study due to suboptimal contrast opacification.

Mild subsegmental atelectasis in both lungs.

Postoperative fluid collection gallbladder fossa of unknown
sterility.

Suprapubic catheter in the bladder with diffuse bladder wall
thickening. 4 x 1.5 cm fluid collection posterior to the bladder
similar to prior study.

## 2016-09-02 DIAGNOSIS — M6281 Muscle weakness (generalized): Secondary | ICD-10-CM | POA: Diagnosis not present

## 2016-09-02 DIAGNOSIS — A419 Sepsis, unspecified organism: Secondary | ICD-10-CM | POA: Diagnosis not present

## 2016-09-02 DIAGNOSIS — R2681 Unsteadiness on feet: Secondary | ICD-10-CM | POA: Diagnosis not present

## 2016-09-02 DIAGNOSIS — R489 Unspecified symbolic dysfunctions: Secondary | ICD-10-CM | POA: Diagnosis not present

## 2016-09-02 DIAGNOSIS — R278 Other lack of coordination: Secondary | ICD-10-CM | POA: Diagnosis not present

## 2016-09-02 DIAGNOSIS — R41841 Cognitive communication deficit: Secondary | ICD-10-CM | POA: Diagnosis not present

## 2016-09-02 DIAGNOSIS — R293 Abnormal posture: Secondary | ICD-10-CM | POA: Diagnosis not present

## 2016-09-02 DIAGNOSIS — R1311 Dysphagia, oral phase: Secondary | ICD-10-CM | POA: Diagnosis not present

## 2016-09-02 DIAGNOSIS — G809 Cerebral palsy, unspecified: Secondary | ICD-10-CM | POA: Diagnosis not present

## 2016-09-03 DIAGNOSIS — R278 Other lack of coordination: Secondary | ICD-10-CM | POA: Diagnosis not present

## 2016-09-03 DIAGNOSIS — R293 Abnormal posture: Secondary | ICD-10-CM | POA: Diagnosis not present

## 2016-09-03 DIAGNOSIS — R2681 Unsteadiness on feet: Secondary | ICD-10-CM | POA: Diagnosis not present

## 2016-09-03 DIAGNOSIS — R41841 Cognitive communication deficit: Secondary | ICD-10-CM | POA: Diagnosis not present

## 2016-09-03 DIAGNOSIS — M6281 Muscle weakness (generalized): Secondary | ICD-10-CM | POA: Diagnosis not present

## 2016-09-03 DIAGNOSIS — A419 Sepsis, unspecified organism: Secondary | ICD-10-CM | POA: Diagnosis not present

## 2016-09-04 DIAGNOSIS — R41841 Cognitive communication deficit: Secondary | ICD-10-CM | POA: Diagnosis not present

## 2016-09-04 DIAGNOSIS — R293 Abnormal posture: Secondary | ICD-10-CM | POA: Diagnosis not present

## 2016-09-04 DIAGNOSIS — M6281 Muscle weakness (generalized): Secondary | ICD-10-CM | POA: Diagnosis not present

## 2016-09-04 DIAGNOSIS — R2681 Unsteadiness on feet: Secondary | ICD-10-CM | POA: Diagnosis not present

## 2016-09-04 DIAGNOSIS — R278 Other lack of coordination: Secondary | ICD-10-CM | POA: Diagnosis not present

## 2016-09-04 DIAGNOSIS — A419 Sepsis, unspecified organism: Secondary | ICD-10-CM | POA: Diagnosis not present

## 2016-09-05 DIAGNOSIS — R278 Other lack of coordination: Secondary | ICD-10-CM | POA: Diagnosis not present

## 2016-09-05 DIAGNOSIS — R293 Abnormal posture: Secondary | ICD-10-CM | POA: Diagnosis not present

## 2016-09-05 DIAGNOSIS — A419 Sepsis, unspecified organism: Secondary | ICD-10-CM | POA: Diagnosis not present

## 2016-09-05 DIAGNOSIS — R41841 Cognitive communication deficit: Secondary | ICD-10-CM | POA: Diagnosis not present

## 2016-09-05 DIAGNOSIS — R2681 Unsteadiness on feet: Secondary | ICD-10-CM | POA: Diagnosis not present

## 2016-09-05 DIAGNOSIS — M6281 Muscle weakness (generalized): Secondary | ICD-10-CM | POA: Diagnosis not present

## 2016-09-06 DIAGNOSIS — R41841 Cognitive communication deficit: Secondary | ICD-10-CM | POA: Diagnosis not present

## 2016-09-06 DIAGNOSIS — A419 Sepsis, unspecified organism: Secondary | ICD-10-CM | POA: Diagnosis not present

## 2016-09-06 DIAGNOSIS — R293 Abnormal posture: Secondary | ICD-10-CM | POA: Diagnosis not present

## 2016-09-06 DIAGNOSIS — R278 Other lack of coordination: Secondary | ICD-10-CM | POA: Diagnosis not present

## 2016-09-06 DIAGNOSIS — K5909 Other constipation: Secondary | ICD-10-CM | POA: Diagnosis not present

## 2016-09-06 DIAGNOSIS — M6281 Muscle weakness (generalized): Secondary | ICD-10-CM | POA: Diagnosis not present

## 2016-09-06 DIAGNOSIS — R2681 Unsteadiness on feet: Secondary | ICD-10-CM | POA: Diagnosis not present

## 2016-09-09 DIAGNOSIS — R293 Abnormal posture: Secondary | ICD-10-CM | POA: Diagnosis not present

## 2016-09-09 DIAGNOSIS — A419 Sepsis, unspecified organism: Secondary | ICD-10-CM | POA: Diagnosis not present

## 2016-09-09 DIAGNOSIS — R41841 Cognitive communication deficit: Secondary | ICD-10-CM | POA: Diagnosis not present

## 2016-09-09 DIAGNOSIS — M6281 Muscle weakness (generalized): Secondary | ICD-10-CM | POA: Diagnosis not present

## 2016-09-09 DIAGNOSIS — R278 Other lack of coordination: Secondary | ICD-10-CM | POA: Diagnosis not present

## 2016-09-09 DIAGNOSIS — R2681 Unsteadiness on feet: Secondary | ICD-10-CM | POA: Diagnosis not present

## 2016-09-10 ENCOUNTER — Telehealth (INDEPENDENT_AMBULATORY_CARE_PROVIDER_SITE_OTHER): Payer: Self-pay | Admitting: Family

## 2016-09-10 DIAGNOSIS — R293 Abnormal posture: Secondary | ICD-10-CM | POA: Diagnosis not present

## 2016-09-10 DIAGNOSIS — R278 Other lack of coordination: Secondary | ICD-10-CM | POA: Diagnosis not present

## 2016-09-10 DIAGNOSIS — R41841 Cognitive communication deficit: Secondary | ICD-10-CM | POA: Diagnosis not present

## 2016-09-10 DIAGNOSIS — R2681 Unsteadiness on feet: Secondary | ICD-10-CM | POA: Diagnosis not present

## 2016-09-10 DIAGNOSIS — A419 Sepsis, unspecified organism: Secondary | ICD-10-CM | POA: Diagnosis not present

## 2016-09-10 DIAGNOSIS — M6281 Muscle weakness (generalized): Secondary | ICD-10-CM | POA: Diagnosis not present

## 2016-09-10 NOTE — Telephone Encounter (Signed)
Noted, thank you

## 2016-09-10 NOTE — Telephone Encounter (Signed)
[name] called and said that she had been doing well since last Baclofen pump adjustment. She said that she was able to transfer from bed to wheelchair, was able to take some steps with assistance and had been working hard at other tasks. She said that she had noticed some increasing spasticity recently and and felt that she needed another adjustment in her pump in order to continue her progress. I scheduled her for a visit with Dr Sharene SkeansHickling on Mon Oct 16th for pump adjustment. TG

## 2016-09-11 DIAGNOSIS — R293 Abnormal posture: Secondary | ICD-10-CM | POA: Diagnosis not present

## 2016-09-11 DIAGNOSIS — R278 Other lack of coordination: Secondary | ICD-10-CM | POA: Diagnosis not present

## 2016-09-11 DIAGNOSIS — M6281 Muscle weakness (generalized): Secondary | ICD-10-CM | POA: Diagnosis not present

## 2016-09-11 DIAGNOSIS — R2681 Unsteadiness on feet: Secondary | ICD-10-CM | POA: Diagnosis not present

## 2016-09-11 DIAGNOSIS — R41841 Cognitive communication deficit: Secondary | ICD-10-CM | POA: Diagnosis not present

## 2016-09-11 DIAGNOSIS — A419 Sepsis, unspecified organism: Secondary | ICD-10-CM | POA: Diagnosis not present

## 2016-09-12 DIAGNOSIS — R293 Abnormal posture: Secondary | ICD-10-CM | POA: Diagnosis not present

## 2016-09-12 DIAGNOSIS — A419 Sepsis, unspecified organism: Secondary | ICD-10-CM | POA: Diagnosis not present

## 2016-09-12 DIAGNOSIS — R278 Other lack of coordination: Secondary | ICD-10-CM | POA: Diagnosis not present

## 2016-09-12 DIAGNOSIS — N319 Neuromuscular dysfunction of bladder, unspecified: Secondary | ICD-10-CM | POA: Diagnosis not present

## 2016-09-12 DIAGNOSIS — R41841 Cognitive communication deficit: Secondary | ICD-10-CM | POA: Diagnosis not present

## 2016-09-12 DIAGNOSIS — M6281 Muscle weakness (generalized): Secondary | ICD-10-CM | POA: Diagnosis not present

## 2016-09-12 DIAGNOSIS — R2681 Unsteadiness on feet: Secondary | ICD-10-CM | POA: Diagnosis not present

## 2016-09-12 DIAGNOSIS — G894 Chronic pain syndrome: Secondary | ICD-10-CM | POA: Diagnosis not present

## 2016-09-13 DIAGNOSIS — R2681 Unsteadiness on feet: Secondary | ICD-10-CM | POA: Diagnosis not present

## 2016-09-13 DIAGNOSIS — R278 Other lack of coordination: Secondary | ICD-10-CM | POA: Diagnosis not present

## 2016-09-13 DIAGNOSIS — N318 Other neuromuscular dysfunction of bladder: Secondary | ICD-10-CM | POA: Diagnosis not present

## 2016-09-13 DIAGNOSIS — R41841 Cognitive communication deficit: Secondary | ICD-10-CM | POA: Diagnosis not present

## 2016-09-13 DIAGNOSIS — M6281 Muscle weakness (generalized): Secondary | ICD-10-CM | POA: Diagnosis not present

## 2016-09-13 DIAGNOSIS — R293 Abnormal posture: Secondary | ICD-10-CM | POA: Diagnosis not present

## 2016-09-13 DIAGNOSIS — A419 Sepsis, unspecified organism: Secondary | ICD-10-CM | POA: Diagnosis not present

## 2016-09-13 DIAGNOSIS — K5909 Other constipation: Secondary | ICD-10-CM | POA: Diagnosis not present

## 2016-09-13 NOTE — Telephone Encounter (Signed)
[name] [name] called in today to say that her spasticity improved and that she doesn't feel that she needs to come in on Monday for an adjustment. She asked me to cancel the appointment, which I will do. TG

## 2016-09-13 NOTE — Telephone Encounter (Signed)
Noted, thank you

## 2016-09-14 DIAGNOSIS — R41841 Cognitive communication deficit: Secondary | ICD-10-CM | POA: Diagnosis not present

## 2016-09-14 DIAGNOSIS — R278 Other lack of coordination: Secondary | ICD-10-CM | POA: Diagnosis not present

## 2016-09-14 DIAGNOSIS — R293 Abnormal posture: Secondary | ICD-10-CM | POA: Diagnosis not present

## 2016-09-14 DIAGNOSIS — M6281 Muscle weakness (generalized): Secondary | ICD-10-CM | POA: Diagnosis not present

## 2016-09-14 DIAGNOSIS — A419 Sepsis, unspecified organism: Secondary | ICD-10-CM | POA: Diagnosis not present

## 2016-09-14 DIAGNOSIS — R2681 Unsteadiness on feet: Secondary | ICD-10-CM | POA: Diagnosis not present

## 2016-09-16 ENCOUNTER — Ambulatory Visit (INDEPENDENT_AMBULATORY_CARE_PROVIDER_SITE_OTHER): Payer: Medicare Other | Admitting: Pediatrics

## 2016-09-16 DIAGNOSIS — R293 Abnormal posture: Secondary | ICD-10-CM | POA: Diagnosis not present

## 2016-09-16 DIAGNOSIS — R41841 Cognitive communication deficit: Secondary | ICD-10-CM | POA: Diagnosis not present

## 2016-09-16 DIAGNOSIS — R278 Other lack of coordination: Secondary | ICD-10-CM | POA: Diagnosis not present

## 2016-09-16 DIAGNOSIS — R2681 Unsteadiness on feet: Secondary | ICD-10-CM | POA: Diagnosis not present

## 2016-09-16 DIAGNOSIS — A419 Sepsis, unspecified organism: Secondary | ICD-10-CM | POA: Diagnosis not present

## 2016-09-16 DIAGNOSIS — M6281 Muscle weakness (generalized): Secondary | ICD-10-CM | POA: Diagnosis not present

## 2016-09-17 DIAGNOSIS — M6281 Muscle weakness (generalized): Secondary | ICD-10-CM | POA: Diagnosis not present

## 2016-09-17 DIAGNOSIS — A419 Sepsis, unspecified organism: Secondary | ICD-10-CM | POA: Diagnosis not present

## 2016-09-17 DIAGNOSIS — R278 Other lack of coordination: Secondary | ICD-10-CM | POA: Diagnosis not present

## 2016-09-17 DIAGNOSIS — R41841 Cognitive communication deficit: Secondary | ICD-10-CM | POA: Diagnosis not present

## 2016-09-17 DIAGNOSIS — R293 Abnormal posture: Secondary | ICD-10-CM | POA: Diagnosis not present

## 2016-09-17 DIAGNOSIS — R2681 Unsteadiness on feet: Secondary | ICD-10-CM | POA: Diagnosis not present

## 2016-09-18 DIAGNOSIS — A419 Sepsis, unspecified organism: Secondary | ICD-10-CM | POA: Diagnosis not present

## 2016-09-18 DIAGNOSIS — R41841 Cognitive communication deficit: Secondary | ICD-10-CM | POA: Diagnosis not present

## 2016-09-18 DIAGNOSIS — R278 Other lack of coordination: Secondary | ICD-10-CM | POA: Diagnosis not present

## 2016-09-18 DIAGNOSIS — R2681 Unsteadiness on feet: Secondary | ICD-10-CM | POA: Diagnosis not present

## 2016-09-18 DIAGNOSIS — R293 Abnormal posture: Secondary | ICD-10-CM | POA: Diagnosis not present

## 2016-09-18 DIAGNOSIS — M6281 Muscle weakness (generalized): Secondary | ICD-10-CM | POA: Diagnosis not present

## 2016-09-19 DIAGNOSIS — R41841 Cognitive communication deficit: Secondary | ICD-10-CM | POA: Diagnosis not present

## 2016-09-19 DIAGNOSIS — R293 Abnormal posture: Secondary | ICD-10-CM | POA: Diagnosis not present

## 2016-09-19 DIAGNOSIS — M6281 Muscle weakness (generalized): Secondary | ICD-10-CM | POA: Diagnosis not present

## 2016-09-19 DIAGNOSIS — A419 Sepsis, unspecified organism: Secondary | ICD-10-CM | POA: Diagnosis not present

## 2016-09-19 DIAGNOSIS — R278 Other lack of coordination: Secondary | ICD-10-CM | POA: Diagnosis not present

## 2016-09-19 DIAGNOSIS — R2681 Unsteadiness on feet: Secondary | ICD-10-CM | POA: Diagnosis not present

## 2016-09-20 DIAGNOSIS — R41841 Cognitive communication deficit: Secondary | ICD-10-CM | POA: Diagnosis not present

## 2016-09-20 DIAGNOSIS — M6281 Muscle weakness (generalized): Secondary | ICD-10-CM | POA: Diagnosis not present

## 2016-09-20 DIAGNOSIS — F428 Other obsessive-compulsive disorder: Secondary | ICD-10-CM | POA: Diagnosis not present

## 2016-09-20 DIAGNOSIS — R278 Other lack of coordination: Secondary | ICD-10-CM | POA: Diagnosis not present

## 2016-09-20 DIAGNOSIS — R293 Abnormal posture: Secondary | ICD-10-CM | POA: Diagnosis not present

## 2016-09-20 DIAGNOSIS — N318 Other neuromuscular dysfunction of bladder: Secondary | ICD-10-CM | POA: Diagnosis not present

## 2016-09-20 DIAGNOSIS — I2699 Other pulmonary embolism without acute cor pulmonale: Secondary | ICD-10-CM | POA: Diagnosis not present

## 2016-09-20 DIAGNOSIS — K5909 Other constipation: Secondary | ICD-10-CM | POA: Diagnosis not present

## 2016-09-20 DIAGNOSIS — A419 Sepsis, unspecified organism: Secondary | ICD-10-CM | POA: Diagnosis not present

## 2016-09-20 DIAGNOSIS — R2681 Unsteadiness on feet: Secondary | ICD-10-CM | POA: Diagnosis not present

## 2016-09-23 DIAGNOSIS — M6281 Muscle weakness (generalized): Secondary | ICD-10-CM | POA: Diagnosis not present

## 2016-09-23 DIAGNOSIS — R41841 Cognitive communication deficit: Secondary | ICD-10-CM | POA: Diagnosis not present

## 2016-09-23 DIAGNOSIS — A419 Sepsis, unspecified organism: Secondary | ICD-10-CM | POA: Diagnosis not present

## 2016-09-23 DIAGNOSIS — R278 Other lack of coordination: Secondary | ICD-10-CM | POA: Diagnosis not present

## 2016-09-23 DIAGNOSIS — R2681 Unsteadiness on feet: Secondary | ICD-10-CM | POA: Diagnosis not present

## 2016-09-23 DIAGNOSIS — R293 Abnormal posture: Secondary | ICD-10-CM | POA: Diagnosis not present

## 2016-09-24 DIAGNOSIS — R41841 Cognitive communication deficit: Secondary | ICD-10-CM | POA: Diagnosis not present

## 2016-09-24 DIAGNOSIS — M6281 Muscle weakness (generalized): Secondary | ICD-10-CM | POA: Diagnosis not present

## 2016-09-24 DIAGNOSIS — R278 Other lack of coordination: Secondary | ICD-10-CM | POA: Diagnosis not present

## 2016-09-24 DIAGNOSIS — A419 Sepsis, unspecified organism: Secondary | ICD-10-CM | POA: Diagnosis not present

## 2016-09-24 DIAGNOSIS — R2681 Unsteadiness on feet: Secondary | ICD-10-CM | POA: Diagnosis not present

## 2016-09-24 DIAGNOSIS — R293 Abnormal posture: Secondary | ICD-10-CM | POA: Diagnosis not present

## 2016-09-25 DIAGNOSIS — R2681 Unsteadiness on feet: Secondary | ICD-10-CM | POA: Diagnosis not present

## 2016-09-25 DIAGNOSIS — R293 Abnormal posture: Secondary | ICD-10-CM | POA: Diagnosis not present

## 2016-09-25 DIAGNOSIS — R278 Other lack of coordination: Secondary | ICD-10-CM | POA: Diagnosis not present

## 2016-09-25 DIAGNOSIS — A419 Sepsis, unspecified organism: Secondary | ICD-10-CM | POA: Diagnosis not present

## 2016-09-25 DIAGNOSIS — R41841 Cognitive communication deficit: Secondary | ICD-10-CM | POA: Diagnosis not present

## 2016-09-25 DIAGNOSIS — M6281 Muscle weakness (generalized): Secondary | ICD-10-CM | POA: Diagnosis not present

## 2016-09-26 DIAGNOSIS — R41841 Cognitive communication deficit: Secondary | ICD-10-CM | POA: Diagnosis not present

## 2016-09-26 DIAGNOSIS — R2681 Unsteadiness on feet: Secondary | ICD-10-CM | POA: Diagnosis not present

## 2016-09-26 DIAGNOSIS — R278 Other lack of coordination: Secondary | ICD-10-CM | POA: Diagnosis not present

## 2016-09-26 DIAGNOSIS — A419 Sepsis, unspecified organism: Secondary | ICD-10-CM | POA: Diagnosis not present

## 2016-09-26 DIAGNOSIS — M6281 Muscle weakness (generalized): Secondary | ICD-10-CM | POA: Diagnosis not present

## 2016-09-26 DIAGNOSIS — R293 Abnormal posture: Secondary | ICD-10-CM | POA: Diagnosis not present

## 2016-09-27 DIAGNOSIS — A419 Sepsis, unspecified organism: Secondary | ICD-10-CM | POA: Diagnosis not present

## 2016-09-27 DIAGNOSIS — R2681 Unsteadiness on feet: Secondary | ICD-10-CM | POA: Diagnosis not present

## 2016-09-27 DIAGNOSIS — M6281 Muscle weakness (generalized): Secondary | ICD-10-CM | POA: Diagnosis not present

## 2016-09-27 DIAGNOSIS — R278 Other lack of coordination: Secondary | ICD-10-CM | POA: Diagnosis not present

## 2016-09-27 DIAGNOSIS — R293 Abnormal posture: Secondary | ICD-10-CM | POA: Diagnosis not present

## 2016-09-27 DIAGNOSIS — R41841 Cognitive communication deficit: Secondary | ICD-10-CM | POA: Diagnosis not present

## 2016-09-28 DIAGNOSIS — M6281 Muscle weakness (generalized): Secondary | ICD-10-CM | POA: Diagnosis not present

## 2016-09-28 DIAGNOSIS — R293 Abnormal posture: Secondary | ICD-10-CM | POA: Diagnosis not present

## 2016-09-28 DIAGNOSIS — A419 Sepsis, unspecified organism: Secondary | ICD-10-CM | POA: Diagnosis not present

## 2016-09-28 DIAGNOSIS — R41841 Cognitive communication deficit: Secondary | ICD-10-CM | POA: Diagnosis not present

## 2016-09-28 DIAGNOSIS — R2681 Unsteadiness on feet: Secondary | ICD-10-CM | POA: Diagnosis not present

## 2016-09-28 DIAGNOSIS — R278 Other lack of coordination: Secondary | ICD-10-CM | POA: Diagnosis not present

## 2016-09-30 DIAGNOSIS — R293 Abnormal posture: Secondary | ICD-10-CM | POA: Diagnosis not present

## 2016-09-30 DIAGNOSIS — A419 Sepsis, unspecified organism: Secondary | ICD-10-CM | POA: Diagnosis not present

## 2016-09-30 DIAGNOSIS — M6281 Muscle weakness (generalized): Secondary | ICD-10-CM | POA: Diagnosis not present

## 2016-09-30 DIAGNOSIS — R2681 Unsteadiness on feet: Secondary | ICD-10-CM | POA: Diagnosis not present

## 2016-09-30 DIAGNOSIS — R278 Other lack of coordination: Secondary | ICD-10-CM | POA: Diagnosis not present

## 2016-09-30 DIAGNOSIS — R41841 Cognitive communication deficit: Secondary | ICD-10-CM | POA: Diagnosis not present

## 2016-10-01 DIAGNOSIS — M6281 Muscle weakness (generalized): Secondary | ICD-10-CM | POA: Diagnosis not present

## 2016-10-01 DIAGNOSIS — R293 Abnormal posture: Secondary | ICD-10-CM | POA: Diagnosis not present

## 2016-10-01 DIAGNOSIS — A419 Sepsis, unspecified organism: Secondary | ICD-10-CM | POA: Diagnosis not present

## 2016-10-01 DIAGNOSIS — R278 Other lack of coordination: Secondary | ICD-10-CM | POA: Diagnosis not present

## 2016-10-01 DIAGNOSIS — R41841 Cognitive communication deficit: Secondary | ICD-10-CM | POA: Diagnosis not present

## 2016-10-01 DIAGNOSIS — R2681 Unsteadiness on feet: Secondary | ICD-10-CM | POA: Diagnosis not present

## 2016-10-02 DIAGNOSIS — R278 Other lack of coordination: Secondary | ICD-10-CM | POA: Diagnosis not present

## 2016-10-02 DIAGNOSIS — R2681 Unsteadiness on feet: Secondary | ICD-10-CM | POA: Diagnosis not present

## 2016-10-02 DIAGNOSIS — A419 Sepsis, unspecified organism: Secondary | ICD-10-CM | POA: Diagnosis not present

## 2016-10-02 DIAGNOSIS — R293 Abnormal posture: Secondary | ICD-10-CM | POA: Diagnosis not present

## 2016-10-02 DIAGNOSIS — R41841 Cognitive communication deficit: Secondary | ICD-10-CM | POA: Diagnosis not present

## 2016-10-02 DIAGNOSIS — M6281 Muscle weakness (generalized): Secondary | ICD-10-CM | POA: Diagnosis not present

## 2016-10-03 DIAGNOSIS — M6281 Muscle weakness (generalized): Secondary | ICD-10-CM | POA: Diagnosis not present

## 2016-10-03 DIAGNOSIS — R41841 Cognitive communication deficit: Secondary | ICD-10-CM | POA: Diagnosis not present

## 2016-10-03 DIAGNOSIS — R278 Other lack of coordination: Secondary | ICD-10-CM | POA: Diagnosis not present

## 2016-10-03 DIAGNOSIS — R2681 Unsteadiness on feet: Secondary | ICD-10-CM | POA: Diagnosis not present

## 2016-10-03 DIAGNOSIS — R293 Abnormal posture: Secondary | ICD-10-CM | POA: Diagnosis not present

## 2016-10-03 DIAGNOSIS — A419 Sepsis, unspecified organism: Secondary | ICD-10-CM | POA: Diagnosis not present

## 2016-10-04 DIAGNOSIS — R41841 Cognitive communication deficit: Secondary | ICD-10-CM | POA: Diagnosis not present

## 2016-10-04 DIAGNOSIS — R2681 Unsteadiness on feet: Secondary | ICD-10-CM | POA: Diagnosis not present

## 2016-10-04 DIAGNOSIS — K5909 Other constipation: Secondary | ICD-10-CM | POA: Diagnosis not present

## 2016-10-04 DIAGNOSIS — R293 Abnormal posture: Secondary | ICD-10-CM | POA: Diagnosis not present

## 2016-10-04 DIAGNOSIS — R278 Other lack of coordination: Secondary | ICD-10-CM | POA: Diagnosis not present

## 2016-10-04 DIAGNOSIS — N318 Other neuromuscular dysfunction of bladder: Secondary | ICD-10-CM | POA: Diagnosis not present

## 2016-10-04 DIAGNOSIS — M6281 Muscle weakness (generalized): Secondary | ICD-10-CM | POA: Diagnosis not present

## 2016-10-04 DIAGNOSIS — A419 Sepsis, unspecified organism: Secondary | ICD-10-CM | POA: Diagnosis not present

## 2016-10-05 DIAGNOSIS — R278 Other lack of coordination: Secondary | ICD-10-CM | POA: Diagnosis not present

## 2016-10-05 DIAGNOSIS — R2681 Unsteadiness on feet: Secondary | ICD-10-CM | POA: Diagnosis not present

## 2016-10-05 DIAGNOSIS — A419 Sepsis, unspecified organism: Secondary | ICD-10-CM | POA: Diagnosis not present

## 2016-10-05 DIAGNOSIS — R293 Abnormal posture: Secondary | ICD-10-CM | POA: Diagnosis not present

## 2016-10-05 DIAGNOSIS — R41841 Cognitive communication deficit: Secondary | ICD-10-CM | POA: Diagnosis not present

## 2016-10-05 DIAGNOSIS — M6281 Muscle weakness (generalized): Secondary | ICD-10-CM | POA: Diagnosis not present

## 2016-10-07 DIAGNOSIS — R2681 Unsteadiness on feet: Secondary | ICD-10-CM | POA: Diagnosis not present

## 2016-10-07 DIAGNOSIS — R41841 Cognitive communication deficit: Secondary | ICD-10-CM | POA: Diagnosis not present

## 2016-10-07 DIAGNOSIS — R293 Abnormal posture: Secondary | ICD-10-CM | POA: Diagnosis not present

## 2016-10-07 DIAGNOSIS — R278 Other lack of coordination: Secondary | ICD-10-CM | POA: Diagnosis not present

## 2016-10-07 DIAGNOSIS — M6281 Muscle weakness (generalized): Secondary | ICD-10-CM | POA: Diagnosis not present

## 2016-10-07 DIAGNOSIS — A419 Sepsis, unspecified organism: Secondary | ICD-10-CM | POA: Diagnosis not present

## 2016-10-08 DIAGNOSIS — R2681 Unsteadiness on feet: Secondary | ICD-10-CM | POA: Diagnosis not present

## 2016-10-08 DIAGNOSIS — A419 Sepsis, unspecified organism: Secondary | ICD-10-CM | POA: Diagnosis not present

## 2016-10-08 DIAGNOSIS — R41841 Cognitive communication deficit: Secondary | ICD-10-CM | POA: Diagnosis not present

## 2016-10-08 DIAGNOSIS — R293 Abnormal posture: Secondary | ICD-10-CM | POA: Diagnosis not present

## 2016-10-08 DIAGNOSIS — M6281 Muscle weakness (generalized): Secondary | ICD-10-CM | POA: Diagnosis not present

## 2016-10-08 DIAGNOSIS — R278 Other lack of coordination: Secondary | ICD-10-CM | POA: Diagnosis not present

## 2016-10-09 DIAGNOSIS — R41841 Cognitive communication deficit: Secondary | ICD-10-CM | POA: Diagnosis not present

## 2016-10-09 DIAGNOSIS — R293 Abnormal posture: Secondary | ICD-10-CM | POA: Diagnosis not present

## 2016-10-09 DIAGNOSIS — R278 Other lack of coordination: Secondary | ICD-10-CM | POA: Diagnosis not present

## 2016-10-09 DIAGNOSIS — R2681 Unsteadiness on feet: Secondary | ICD-10-CM | POA: Diagnosis not present

## 2016-10-09 DIAGNOSIS — M6281 Muscle weakness (generalized): Secondary | ICD-10-CM | POA: Diagnosis not present

## 2016-10-09 DIAGNOSIS — A419 Sepsis, unspecified organism: Secondary | ICD-10-CM | POA: Diagnosis not present

## 2016-10-10 DIAGNOSIS — A419 Sepsis, unspecified organism: Secondary | ICD-10-CM | POA: Diagnosis not present

## 2016-10-10 DIAGNOSIS — R278 Other lack of coordination: Secondary | ICD-10-CM | POA: Diagnosis not present

## 2016-10-10 DIAGNOSIS — R41841 Cognitive communication deficit: Secondary | ICD-10-CM | POA: Diagnosis not present

## 2016-10-10 DIAGNOSIS — R293 Abnormal posture: Secondary | ICD-10-CM | POA: Diagnosis not present

## 2016-10-10 DIAGNOSIS — R2681 Unsteadiness on feet: Secondary | ICD-10-CM | POA: Diagnosis not present

## 2016-10-10 DIAGNOSIS — M6281 Muscle weakness (generalized): Secondary | ICD-10-CM | POA: Diagnosis not present

## 2016-10-11 ENCOUNTER — Encounter: Payer: Self-pay | Admitting: Physical Medicine & Rehabilitation

## 2016-10-11 ENCOUNTER — Ambulatory Visit (HOSPITAL_BASED_OUTPATIENT_CLINIC_OR_DEPARTMENT_OTHER): Payer: Medicare Other | Admitting: Physical Medicine & Rehabilitation

## 2016-10-11 ENCOUNTER — Encounter: Payer: Medicare Other | Attending: Physical Medicine & Rehabilitation

## 2016-10-11 VITALS — BP 104/65 | HR 77 | Resp 14

## 2016-10-11 DIAGNOSIS — R278 Other lack of coordination: Secondary | ICD-10-CM | POA: Diagnosis not present

## 2016-10-11 DIAGNOSIS — G801 Spastic diplegic cerebral palsy: Secondary | ICD-10-CM | POA: Diagnosis not present

## 2016-10-11 DIAGNOSIS — R2681 Unsteadiness on feet: Secondary | ICD-10-CM | POA: Diagnosis not present

## 2016-10-11 DIAGNOSIS — M6281 Muscle weakness (generalized): Secondary | ICD-10-CM | POA: Diagnosis not present

## 2016-10-11 DIAGNOSIS — R293 Abnormal posture: Secondary | ICD-10-CM | POA: Diagnosis not present

## 2016-10-11 DIAGNOSIS — R252 Cramp and spasm: Secondary | ICD-10-CM | POA: Insufficient documentation

## 2016-10-11 DIAGNOSIS — A419 Sepsis, unspecified organism: Secondary | ICD-10-CM | POA: Diagnosis not present

## 2016-10-11 DIAGNOSIS — R41841 Cognitive communication deficit: Secondary | ICD-10-CM | POA: Diagnosis not present

## 2016-10-11 NOTE — Progress Notes (Signed)
Botox Injection for spasticity using needle EMG guidance  Dilution: 50 Units/ml Indication: Severe spasticity which interferes with ADL,mobility and/or  hygiene and is unresponsive to medication management and other conservative care Informed consent was obtained after describing risks and benefits of the procedure with the patient. This includes bleeding, bruising, infection, excessive weakness, or medication side effects. A REMS form is on file and signed. Needle: 27g1" needle electrode Number of units per muscle Biceps50 FCR50 FCU50 FDS50 FDP50 Pronator teres 50 All injections were done after obtaining appropriate EMG activity and after negative drawback for blood. The patient tolerated the procedure well. Post procedure instructions were given. A followup appointment was made.

## 2016-10-11 NOTE — Patient Instructions (Signed)

## 2016-10-14 DIAGNOSIS — M6281 Muscle weakness (generalized): Secondary | ICD-10-CM | POA: Diagnosis not present

## 2016-10-14 DIAGNOSIS — A419 Sepsis, unspecified organism: Secondary | ICD-10-CM | POA: Diagnosis not present

## 2016-10-14 DIAGNOSIS — R41841 Cognitive communication deficit: Secondary | ICD-10-CM | POA: Diagnosis not present

## 2016-10-14 DIAGNOSIS — R2681 Unsteadiness on feet: Secondary | ICD-10-CM | POA: Diagnosis not present

## 2016-10-14 DIAGNOSIS — R293 Abnormal posture: Secondary | ICD-10-CM | POA: Diagnosis not present

## 2016-10-14 DIAGNOSIS — R278 Other lack of coordination: Secondary | ICD-10-CM | POA: Diagnosis not present

## 2016-10-15 DIAGNOSIS — R278 Other lack of coordination: Secondary | ICD-10-CM | POA: Diagnosis not present

## 2016-10-15 DIAGNOSIS — A419 Sepsis, unspecified organism: Secondary | ICD-10-CM | POA: Diagnosis not present

## 2016-10-15 DIAGNOSIS — R293 Abnormal posture: Secondary | ICD-10-CM | POA: Diagnosis not present

## 2016-10-15 DIAGNOSIS — R41841 Cognitive communication deficit: Secondary | ICD-10-CM | POA: Diagnosis not present

## 2016-10-15 DIAGNOSIS — R2681 Unsteadiness on feet: Secondary | ICD-10-CM | POA: Diagnosis not present

## 2016-10-15 DIAGNOSIS — M6281 Muscle weakness (generalized): Secondary | ICD-10-CM | POA: Diagnosis not present

## 2016-10-16 DIAGNOSIS — R2681 Unsteadiness on feet: Secondary | ICD-10-CM | POA: Diagnosis not present

## 2016-10-16 DIAGNOSIS — A419 Sepsis, unspecified organism: Secondary | ICD-10-CM | POA: Diagnosis not present

## 2016-10-16 DIAGNOSIS — M6281 Muscle weakness (generalized): Secondary | ICD-10-CM | POA: Diagnosis not present

## 2016-10-16 DIAGNOSIS — R41841 Cognitive communication deficit: Secondary | ICD-10-CM | POA: Diagnosis not present

## 2016-10-16 DIAGNOSIS — R278 Other lack of coordination: Secondary | ICD-10-CM | POA: Diagnosis not present

## 2016-10-16 DIAGNOSIS — R293 Abnormal posture: Secondary | ICD-10-CM | POA: Diagnosis not present

## 2016-10-17 DIAGNOSIS — R293 Abnormal posture: Secondary | ICD-10-CM | POA: Diagnosis not present

## 2016-10-17 DIAGNOSIS — R2681 Unsteadiness on feet: Secondary | ICD-10-CM | POA: Diagnosis not present

## 2016-10-17 DIAGNOSIS — Z9359 Other cystostomy status: Secondary | ICD-10-CM | POA: Diagnosis not present

## 2016-10-17 DIAGNOSIS — R41841 Cognitive communication deficit: Secondary | ICD-10-CM | POA: Diagnosis not present

## 2016-10-17 DIAGNOSIS — N319 Neuromuscular dysfunction of bladder, unspecified: Secondary | ICD-10-CM | POA: Diagnosis not present

## 2016-10-17 DIAGNOSIS — M6281 Muscle weakness (generalized): Secondary | ICD-10-CM | POA: Diagnosis not present

## 2016-10-17 DIAGNOSIS — R278 Other lack of coordination: Secondary | ICD-10-CM | POA: Diagnosis not present

## 2016-10-17 DIAGNOSIS — A419 Sepsis, unspecified organism: Secondary | ICD-10-CM | POA: Diagnosis not present

## 2016-10-18 DIAGNOSIS — M6281 Muscle weakness (generalized): Secondary | ICD-10-CM | POA: Diagnosis not present

## 2016-10-18 DIAGNOSIS — R41841 Cognitive communication deficit: Secondary | ICD-10-CM | POA: Diagnosis not present

## 2016-10-18 DIAGNOSIS — R278 Other lack of coordination: Secondary | ICD-10-CM | POA: Diagnosis not present

## 2016-10-18 DIAGNOSIS — A419 Sepsis, unspecified organism: Secondary | ICD-10-CM | POA: Diagnosis not present

## 2016-10-18 DIAGNOSIS — R293 Abnormal posture: Secondary | ICD-10-CM | POA: Diagnosis not present

## 2016-10-18 DIAGNOSIS — R2681 Unsteadiness on feet: Secondary | ICD-10-CM | POA: Diagnosis not present

## 2016-10-21 DIAGNOSIS — R41841 Cognitive communication deficit: Secondary | ICD-10-CM | POA: Diagnosis not present

## 2016-10-21 DIAGNOSIS — A419 Sepsis, unspecified organism: Secondary | ICD-10-CM | POA: Diagnosis not present

## 2016-10-21 DIAGNOSIS — M6281 Muscle weakness (generalized): Secondary | ICD-10-CM | POA: Diagnosis not present

## 2016-10-21 DIAGNOSIS — R278 Other lack of coordination: Secondary | ICD-10-CM | POA: Diagnosis not present

## 2016-10-21 DIAGNOSIS — R293 Abnormal posture: Secondary | ICD-10-CM | POA: Diagnosis not present

## 2016-10-21 DIAGNOSIS — R2681 Unsteadiness on feet: Secondary | ICD-10-CM | POA: Diagnosis not present

## 2016-10-22 DIAGNOSIS — A419 Sepsis, unspecified organism: Secondary | ICD-10-CM | POA: Diagnosis not present

## 2016-10-22 DIAGNOSIS — R278 Other lack of coordination: Secondary | ICD-10-CM | POA: Diagnosis not present

## 2016-10-22 DIAGNOSIS — R2681 Unsteadiness on feet: Secondary | ICD-10-CM | POA: Diagnosis not present

## 2016-10-22 DIAGNOSIS — R41841 Cognitive communication deficit: Secondary | ICD-10-CM | POA: Diagnosis not present

## 2016-10-22 DIAGNOSIS — R293 Abnormal posture: Secondary | ICD-10-CM | POA: Diagnosis not present

## 2016-10-22 DIAGNOSIS — M6281 Muscle weakness (generalized): Secondary | ICD-10-CM | POA: Diagnosis not present

## 2016-10-23 DIAGNOSIS — R278 Other lack of coordination: Secondary | ICD-10-CM | POA: Diagnosis not present

## 2016-10-23 DIAGNOSIS — R293 Abnormal posture: Secondary | ICD-10-CM | POA: Diagnosis not present

## 2016-10-23 DIAGNOSIS — R2681 Unsteadiness on feet: Secondary | ICD-10-CM | POA: Diagnosis not present

## 2016-10-23 DIAGNOSIS — R41841 Cognitive communication deficit: Secondary | ICD-10-CM | POA: Diagnosis not present

## 2016-10-23 DIAGNOSIS — A419 Sepsis, unspecified organism: Secondary | ICD-10-CM | POA: Diagnosis not present

## 2016-10-23 DIAGNOSIS — M6281 Muscle weakness (generalized): Secondary | ICD-10-CM | POA: Diagnosis not present

## 2016-10-24 DIAGNOSIS — R41841 Cognitive communication deficit: Secondary | ICD-10-CM | POA: Diagnosis not present

## 2016-10-24 DIAGNOSIS — R2681 Unsteadiness on feet: Secondary | ICD-10-CM | POA: Diagnosis not present

## 2016-10-24 DIAGNOSIS — M6281 Muscle weakness (generalized): Secondary | ICD-10-CM | POA: Diagnosis not present

## 2016-10-24 DIAGNOSIS — R278 Other lack of coordination: Secondary | ICD-10-CM | POA: Diagnosis not present

## 2016-10-24 DIAGNOSIS — A419 Sepsis, unspecified organism: Secondary | ICD-10-CM | POA: Diagnosis not present

## 2016-10-24 DIAGNOSIS — R293 Abnormal posture: Secondary | ICD-10-CM | POA: Diagnosis not present

## 2016-10-25 DIAGNOSIS — M6281 Muscle weakness (generalized): Secondary | ICD-10-CM | POA: Diagnosis not present

## 2016-10-25 DIAGNOSIS — R293 Abnormal posture: Secondary | ICD-10-CM | POA: Diagnosis not present

## 2016-10-25 DIAGNOSIS — A419 Sepsis, unspecified organism: Secondary | ICD-10-CM | POA: Diagnosis not present

## 2016-10-25 DIAGNOSIS — R278 Other lack of coordination: Secondary | ICD-10-CM | POA: Diagnosis not present

## 2016-10-25 DIAGNOSIS — R41841 Cognitive communication deficit: Secondary | ICD-10-CM | POA: Diagnosis not present

## 2016-10-25 DIAGNOSIS — R2681 Unsteadiness on feet: Secondary | ICD-10-CM | POA: Diagnosis not present

## 2016-10-28 DIAGNOSIS — M6281 Muscle weakness (generalized): Secondary | ICD-10-CM | POA: Diagnosis not present

## 2016-10-28 DIAGNOSIS — R293 Abnormal posture: Secondary | ICD-10-CM | POA: Diagnosis not present

## 2016-10-28 DIAGNOSIS — A419 Sepsis, unspecified organism: Secondary | ICD-10-CM | POA: Diagnosis not present

## 2016-10-28 DIAGNOSIS — R2681 Unsteadiness on feet: Secondary | ICD-10-CM | POA: Diagnosis not present

## 2016-10-28 DIAGNOSIS — R41841 Cognitive communication deficit: Secondary | ICD-10-CM | POA: Diagnosis not present

## 2016-10-28 DIAGNOSIS — R278 Other lack of coordination: Secondary | ICD-10-CM | POA: Diagnosis not present

## 2016-10-29 DIAGNOSIS — R41841 Cognitive communication deficit: Secondary | ICD-10-CM | POA: Diagnosis not present

## 2016-10-29 DIAGNOSIS — R278 Other lack of coordination: Secondary | ICD-10-CM | POA: Diagnosis not present

## 2016-10-29 DIAGNOSIS — A419 Sepsis, unspecified organism: Secondary | ICD-10-CM | POA: Diagnosis not present

## 2016-10-29 DIAGNOSIS — R2681 Unsteadiness on feet: Secondary | ICD-10-CM | POA: Diagnosis not present

## 2016-10-29 DIAGNOSIS — R293 Abnormal posture: Secondary | ICD-10-CM | POA: Diagnosis not present

## 2016-10-29 DIAGNOSIS — M6281 Muscle weakness (generalized): Secondary | ICD-10-CM | POA: Diagnosis not present

## 2016-10-30 DIAGNOSIS — A419 Sepsis, unspecified organism: Secondary | ICD-10-CM | POA: Diagnosis not present

## 2016-10-30 DIAGNOSIS — M6281 Muscle weakness (generalized): Secondary | ICD-10-CM | POA: Diagnosis not present

## 2016-10-30 DIAGNOSIS — R2681 Unsteadiness on feet: Secondary | ICD-10-CM | POA: Diagnosis not present

## 2016-10-30 DIAGNOSIS — R293 Abnormal posture: Secondary | ICD-10-CM | POA: Diagnosis not present

## 2016-10-30 DIAGNOSIS — R41841 Cognitive communication deficit: Secondary | ICD-10-CM | POA: Diagnosis not present

## 2016-10-30 DIAGNOSIS — R278 Other lack of coordination: Secondary | ICD-10-CM | POA: Diagnosis not present

## 2016-10-31 DIAGNOSIS — A419 Sepsis, unspecified organism: Secondary | ICD-10-CM | POA: Diagnosis not present

## 2016-10-31 DIAGNOSIS — R293 Abnormal posture: Secondary | ICD-10-CM | POA: Diagnosis not present

## 2016-10-31 DIAGNOSIS — R278 Other lack of coordination: Secondary | ICD-10-CM | POA: Diagnosis not present

## 2016-10-31 DIAGNOSIS — M6281 Muscle weakness (generalized): Secondary | ICD-10-CM | POA: Diagnosis not present

## 2016-10-31 DIAGNOSIS — R2681 Unsteadiness on feet: Secondary | ICD-10-CM | POA: Diagnosis not present

## 2016-10-31 DIAGNOSIS — R41841 Cognitive communication deficit: Secondary | ICD-10-CM | POA: Diagnosis not present

## 2016-11-01 ENCOUNTER — Ambulatory Visit: Payer: Medicare Other | Admitting: Gastroenterology

## 2016-11-01 DIAGNOSIS — R2681 Unsteadiness on feet: Secondary | ICD-10-CM | POA: Diagnosis not present

## 2016-11-01 DIAGNOSIS — A419 Sepsis, unspecified organism: Secondary | ICD-10-CM | POA: Diagnosis not present

## 2016-11-01 DIAGNOSIS — K5909 Other constipation: Secondary | ICD-10-CM | POA: Diagnosis not present

## 2016-11-01 DIAGNOSIS — R278 Other lack of coordination: Secondary | ICD-10-CM | POA: Diagnosis not present

## 2016-11-01 DIAGNOSIS — R41841 Cognitive communication deficit: Secondary | ICD-10-CM | POA: Diagnosis not present

## 2016-11-01 DIAGNOSIS — M6281 Muscle weakness (generalized): Secondary | ICD-10-CM | POA: Diagnosis not present

## 2016-11-01 DIAGNOSIS — R293 Abnormal posture: Secondary | ICD-10-CM | POA: Diagnosis not present

## 2016-11-04 DIAGNOSIS — A419 Sepsis, unspecified organism: Secondary | ICD-10-CM | POA: Diagnosis not present

## 2016-11-04 DIAGNOSIS — R293 Abnormal posture: Secondary | ICD-10-CM | POA: Diagnosis not present

## 2016-11-04 DIAGNOSIS — M6281 Muscle weakness (generalized): Secondary | ICD-10-CM | POA: Diagnosis not present

## 2016-11-04 DIAGNOSIS — R41841 Cognitive communication deficit: Secondary | ICD-10-CM | POA: Diagnosis not present

## 2016-11-04 DIAGNOSIS — R2681 Unsteadiness on feet: Secondary | ICD-10-CM | POA: Diagnosis not present

## 2016-11-04 DIAGNOSIS — R278 Other lack of coordination: Secondary | ICD-10-CM | POA: Diagnosis not present

## 2016-11-05 DIAGNOSIS — R278 Other lack of coordination: Secondary | ICD-10-CM | POA: Diagnosis not present

## 2016-11-05 DIAGNOSIS — M6281 Muscle weakness (generalized): Secondary | ICD-10-CM | POA: Diagnosis not present

## 2016-11-05 DIAGNOSIS — R293 Abnormal posture: Secondary | ICD-10-CM | POA: Diagnosis not present

## 2016-11-05 DIAGNOSIS — A419 Sepsis, unspecified organism: Secondary | ICD-10-CM | POA: Diagnosis not present

## 2016-11-05 DIAGNOSIS — R41841 Cognitive communication deficit: Secondary | ICD-10-CM | POA: Diagnosis not present

## 2016-11-05 DIAGNOSIS — R2681 Unsteadiness on feet: Secondary | ICD-10-CM | POA: Diagnosis not present

## 2016-11-06 DIAGNOSIS — M6281 Muscle weakness (generalized): Secondary | ICD-10-CM | POA: Diagnosis not present

## 2016-11-06 DIAGNOSIS — R41841 Cognitive communication deficit: Secondary | ICD-10-CM | POA: Diagnosis not present

## 2016-11-06 DIAGNOSIS — R293 Abnormal posture: Secondary | ICD-10-CM | POA: Diagnosis not present

## 2016-11-06 DIAGNOSIS — A419 Sepsis, unspecified organism: Secondary | ICD-10-CM | POA: Diagnosis not present

## 2016-11-06 DIAGNOSIS — R2681 Unsteadiness on feet: Secondary | ICD-10-CM | POA: Diagnosis not present

## 2016-11-06 DIAGNOSIS — R278 Other lack of coordination: Secondary | ICD-10-CM | POA: Diagnosis not present

## 2016-11-07 ENCOUNTER — Ambulatory Visit (INDEPENDENT_AMBULATORY_CARE_PROVIDER_SITE_OTHER): Payer: Medicare Other | Admitting: Pediatrics

## 2016-11-07 ENCOUNTER — Encounter (INDEPENDENT_AMBULATORY_CARE_PROVIDER_SITE_OTHER): Payer: Self-pay | Admitting: Pediatrics

## 2016-11-07 DIAGNOSIS — R278 Other lack of coordination: Secondary | ICD-10-CM | POA: Diagnosis not present

## 2016-11-07 DIAGNOSIS — G808 Other cerebral palsy: Secondary | ICD-10-CM | POA: Diagnosis not present

## 2016-11-07 DIAGNOSIS — M6281 Muscle weakness (generalized): Secondary | ICD-10-CM | POA: Diagnosis not present

## 2016-11-07 DIAGNOSIS — G803 Athetoid cerebral palsy: Secondary | ICD-10-CM

## 2016-11-07 DIAGNOSIS — A419 Sepsis, unspecified organism: Secondary | ICD-10-CM | POA: Diagnosis not present

## 2016-11-07 DIAGNOSIS — R2681 Unsteadiness on feet: Secondary | ICD-10-CM | POA: Diagnosis not present

## 2016-11-07 DIAGNOSIS — R293 Abnormal posture: Secondary | ICD-10-CM | POA: Diagnosis not present

## 2016-11-07 DIAGNOSIS — R41841 Cognitive communication deficit: Secondary | ICD-10-CM | POA: Diagnosis not present

## 2016-11-07 NOTE — Progress Notes (Signed)
Patient: [name] [name] MRN: [id number] Sex: female DOB: 1976/07/30  Provider: Deetta Perla, MD Location of Care: [facility]  Note type: Routine return visit  History of Present Illness: Referral Source: Melanie March, MD History from: patient and [facility] chart Chief Complaint: Spastic/Athetoid Quadriparesis/Baclofen Pump Refill  [name] [name] is a 40 y.o. female who returns on November 07, 2016 for the first time since September 10, 2016.  I note that in addition to her baclofen pump she is receiving Botox injections in her upper extremities and is benefitting from them.  [name] [name] is determined that she is going to be independent.  She is getting up with assistance, walking with assistance, and beginning to help dress herself and to feed herself.  This is the most effort she has put forth on her behalf in years.  I praised her for her efforts and told her that she needed to continue with them and we would do all that we could to help her.  She did not voice any other complaints today.    Procedure emptying refilling and reprogramming intrathecal baclofen pump. The device was interrogated and showed a complex continuous infusion of baclofen that has a basal rate of 17.0 mcg per hour. The patient receives bolus doses of 27 mcg delivered at midnight, 6 AM, 12 noon, and 6 PM, for total of 474.6 mcg per day.   The patient was sterilely prepped and draped. A 1-1/2 inch 22-gauge noncoring Huebner needle was inserted on the first pass. The apex of the reservoir is going horizontally to the left. 1.7 mL was withdrawn from the pump placing it under partial vacuum. 40 mL of baclofen (concentration 2000 mcg/mL) was instilled into the pump through a Millipore filter. She tolerated the procedure well.  Her reservoir alarm date is Apr 16, 2017, or 160 days from now. ERI 48 months.  Review of Systems: 12 system review was assessed and was negative  Past Medical  History Diagnosis Date  . Abdominal pain 01/02/2012   Overview:  Overview:  Per Previous pcp note- Dr. Ta--Pt has history of endometriosis and had DUB in 2011.  She She had u/s that showed normal endometrial stripe.  She had endometrial bx that was wnl.  She has a lot of abd cramping every month.  This cramping was sometimes not relieved with hydrocodone.  She was referred to [facility] for IUD placement to help endometriosis and abd cramping.  [name]  . Abdominal pain 01/02/2012   Overview:  Overview:  Per Previous pcp note- Dr. Ta--Pt has history of endometriosis and had DUB in 2011.  She She had u/s that showed normal endometrial stripe.  She had endometrial bx that was wnl.  She has a lot of abd cramping every month.  This cramping was sometimes not relieved with hydrocodone.  She was referred to [facility] for IUD placement to help endometriosis and abd cramping.  [name] was placed 04/30/11 by [facility].  Pt still complains of abd cramping, which I am treating with hydrocodone and ultram. Pt reports some initial relief of pain from IUD, but states it has now returned back to similar to previous cramping. Gave patient a shot of Toradol 60 mg IM today-since positive exacerbation of abdominal cramping during past 2 weeks. Physical exam reassuring. Patient may need to return [facility] further workup since she is plugged in with the GYN providers there for further treatment options regarding endometriosis and abdominal cramping. The dysfunctional uterine bleeding now well  contr  . Acute cystitis with hematuria   . Acute cystitis without hematuria   . Acute respiratory failure with hypoxia ([name])   . Anemia   . Cellulitis 12/22/2014  . Cerebral palsy ([name])   . Cerebral palsy ([name])    Spastic Cerebral palsy, mentally intact  . Contracture, joint, multiple sites    Electric wheelchair, uses left hand to operate chair.   . Depression   . Disease of female genital organs 10/24/2010   Overview:   Overview:  Pt has history of endometriosis and had DUB in 2011.  She had u/s that showed normal endometrial stripe.  She had endometrial bx that was wnl.  She has a lot of abd cramping every month.  This cramping was sometimes not relieved with hydrocodone.  She was referred to [facility] for IUD placement to help endometriosis and abd cramping.  [name] was placed 04/30/11 by [facility].  Pt still complains of abd cramping, which was treated with hydrocodone and ultram.   Last seen at [facility] by OB/GYN 12/24/11, given Depo Lupron injection.  Ordering CT scan to r/o additional cause of abdominal pain (pt with known endometriosis and will not tolerate vaginal U/S, pain not improved s/p [name] or with Lupron injection-- only caused hot flashes and amenorrhea, no pain relief).  Also made referral to pain clinic for better pain control.  Do not think hysterectomy would help with pain, and concern for contamination of baclofen pump if did proceed with surgery.    8/5/13Marilynne [name] OB/GYN: Pt was seen by   . [name]   . Dysarthria   . Dyspnea and respiratory abnormality 02/04/2011   Overview:  Overview:  Pt has history of recurrent PE and is on chronic coumadin.  Pt has been complaining of dyspnea that she describes as chest tightness.  She has been to the ER multiple times for this.  Usually she gets CTA and serial CE.  She has had at least 6 CTA in a period of since 2011.  She also has an O2 requirement. It is unclear why pt feels dyspneic, complains of chest tightness, or has O2 requirement.  I thought that her complaint may be from deconditioning and referred her to PT.  Unfortunately Medicaid only pays for 4 PT visits.  Pt should be doing these exercises at home, but she has not.  I have referred her to [facility], and she has been seen by [name].  He did not understand the etiology of her dyspnea and O2 requirement.  He will continue to follow her.   Last Assessment & Plan:  Pt presents with  concerns for chest tightness and dyspnea.  She was seen in ED yesterday and discharged home.  While there she had a CT chest 04/20/11 showed 1.  Multifocal degradation as detailed above.  N  . Endometriosis   . GERD (gastroesophageal reflux disease)   . Gram positive sepsis ([name]) 10/24/2014  . Gross hematuria 03/12/2013   Overview:  Overview:  status post replacement of suprapubic tube 03/04/2013  Last Assessment & Plan:  Recent urine culture collected in ED on 5/4 did not result in significant growth. Moreover, patient has been asymptomatic with normal WBC, no fever and no true symptoms making symptomatic UTI less likely.  Will continue to monitor for any sign of infection.  Patient's xarelto had been held during her recent hospitalization due to hematuria. This has since been re-started. Monitor bleeding.    [name] Kitchen HCAP (healthcare-associated pneumonia) 10/25/2014  .  History of anticoagulant therapy 10/21/2012  . History of endometriosis 10/2012   OBGYN [facility]: Laparoscopic Endometrial Ablation, TVH,  . History of recurrent UTIs   . Hypertension   . HYPERTENSION, BENIGN 01/31/2010   Qualifier: Diagnosis of  By: [name], MD, [name] Incontinent of feces 03/11/2016  . Infantile cerebral palsy ([name]) 01/29/2007   Overview:  She has spastic CP.  She is mentally intact.  She is wheelchair bound.  She is wheelchair bound for ambulation      . Intracervical pessary 05/07/2011   Overview:  Overview:  Placed by [facility]YN 04/30/11 to treat DUB and Endometriosis.  She is seen at GYN clinic at [facility] but was considered a poor surgical candidate and referred her to [facility].  For DUB she had pelvic ultrasound that showed thin stripe and she had endometrial Bx by Dr Jennette Kettle in GYN clinic, which was negative.     . Macroscopic hematuria 03/10/2013   status post replacement of suprapubic tube 03/04/2013   . Migraine   . OCD (obsessive compulsive disorder)   . Parapneumonic effusion 10/25/2014  . Presence of  intrathecal baclofen pump 03/07/2014   R LQ. Insertion T10.    [name] Kitchen Psychiatric illness 10/31/2014  . Pulmonary embolism ([name])    Lifetime Coumadin  . Pulmonary embolism ([name]) 2011, 01/2011   Will be on lifetime coumadin  . Recurrent pulmonary embolism ([name]) 05/03/2011   Pt has history of recurrent PE and is on chronic coumadin.  Pt has been complaining of dyspnea that she describes as chest tightness.  She has been to the ER multiple times for this.  Usually she gets CTA and serial CE.  She has had at least 6 CTA in a period of since 2011.  She also has an O2 requirement. It is unclear why pt feels dyspneic, complains of chest tightness, or has O2 requirement.  I thought that her complaint may be from deconditioning and referred her to PT.  Unfortunately Medicaid only pays for 4 PT visits.  Pt should be doing these exercises at home, but she has not.  I have referred her to [facility], and she has been seen by [name].  He did not understand the etiology of her dyspnea and O2 requirement.  He will continue to follow her.     . Seborrheic keratosis 01/18/2011  . Seizures ([name]) 02/12/2016  . Spasticity 01/05/2014  . Transient alteration of awareness, recurrent 09/08/2006   Recurrent episodes where Talibah  loses contact with her world and that have been studied thoroughly and appear nonepileptic in nature per Dr Terrace Arabia (neurology) The patient has had episodes starting in 2007 that initially began 1-4 times a day during which time she would have periods of unawareness followed by confusion. This continuous video EEG monitoring performed from October 8-12, 2007. Had   . Urethra dilated and patulous 07/03/2015   Patulous, Dilated Urethra. 5mL balloon on a 22-Fr catheter hoping this would prevent the bladder spasm from pushing the balloon down the urethra (Dr Logan Bores, Urology [facility], July 2016)    Hospitalizations: No., Head Injury: No., Nervous System Infections: No., Immunizations up to date: Yes.    Birth  History Extremely premature infant  Behavior History anxiety, depression, nonepileptic loss of consciousness  Surgical History Procedure Laterality Date  . ABDOMINAL HYSTERECTOMY    . APPENDECTOMY    . BACLOFEN PUMP REFILL     x 3 times  . CARPAL TUNNEL RELEASE  08/2008  Dr Teressa SenterSypher  . CESAREAN SECTION     x 2  . CHOLECYSTECTOMY  10/14/2015   Procedure: LAPAROSCOPIC CHOLECYSTECTOMY;  Surgeon: Violeta GelinasBurke Thompson, MD;  [facility];  Service: General;;  . COLPOSCOPY  06/2000  . INTRAUTERINE DEVICE INSERTION  04/30/11   [facility]  . laparoscopically assisted Vag Hysterectomy  10/27/2012  . MULTIPLE EXTRACTIONS WITH ALVEOLOPLASTY Bilateral 07/19/2013   Procedure: EXTRACTIONS #4, 1,61,09,605,11,16,19;  Surgeon: Francene Findershristopher L Antrim, DDS;  [facility];  Service: Oral Surgery;  Laterality: Bilateral;  . NEUROMA SURGERY Left    anterior and posterior intersosseous  . PAIN PUMP IMPLANTATION N/A 03/07/2014   Procedure: baclofen pump revision/replacement and Catheter connection replacement;  Surgeon: Cristi LoronJeffrey D Jenkins, MD;  Location: [facility] OR[facility];  Service: Neurosurgery;  Laterality: N/A;  baclofen pump revision/replacement and Catheter connection replacement  . PROGRAMABLE BACLOFEN PUMP REVISION  03/17/14   Battery Replacement   . TUBAL LIGATION  2003  . URETHRA SURGERY    . WRIST SURGERY  06/2010   Dr Dierdre SearlesLi[name]   Family History family history includes Alzheimer's disease in her paternal grandfather; Asthma in her father; Breast cancer in her paternal grandmother; Cancer in her paternal grandmother; Colon cancer in her maternal grandmother; Heart attack in her maternal grandfather. Family history is negative for migraines, seizures, intellectual disabilities, blindness, deafness, birth defects, chromosomal disorder, or autism.  Social History . Marital status: Divorced    Spouse name: N/A  . Number of children: 2  . Years of education: college    Occupational History  . UNEMPLOYED Unemployed   Social History Main Topics  . Smoking status: Never Smoker  . Smokeless tobacco: Never Used  . Alcohol use 0.6 oz/week    1 Glasses of wine per week     Comment: Drinks alcohol once a month.  . Drug use: No  . Sexual activity: No   Social History Narrative    [name] ClosJudith graduated from college with an Scientist, research (physical sciences)Associates Degree in Surveyor, mineralsCriminal Justice.     She enjoys shopping.    Lives in a nursing facility[facility] since 02/11/16       [name] ArdsFrank [name], who has schizophrenia, MR, is father of daughter.  [name] FellersFrank and pt are no longer together.  [name] FellersFrank does not see [name] [name]. Daughter is 768 yo, Stage managerKiarra [name], lives with pt.         [name] [name] [phone]), lives with pt's parents.       Needs assistance with ADL's and IADL's--has personal care services 20 hrs/wk;        No tobacco, EtOH, drugs.       **Patient very concerned about her kids being taken from her.       **Case Manager: [name] [name] [phone]       [facility]   Allergies Allergen Reactions  . Morphine Dermatitis and Other (See Comments)    Skin turned red   Physical Exam LMP 04/05/2011   Quadriparesis with spasticity and dystonia tone is more rigid in her lower extremities and upper extremities, Ashworth 2 in her legs, Ashworth 1 in her arms.  She has clumsy fine motor movements but is able to manipulate her wheelchair and herself on, and was able to write her name on the procedure permit.  He continues to have problems with head control she can bring her head upright but fatigues quickly.  She is able to move her extremities against gravity.  She states  that she is able to get into a standing position and make a pivot-turn movement: she is able to take a few steps but requires assistance.  I did not test that today.  Assessment 1. Cerebral palsy, quadriplegic, G80.8. 2. 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  Discussion I am pleased  that [name] HongJudy is trying to become more independent.  Whether she will be successful or not remains to be seen, but it is important that she tries.  Every time that she has made progress, she has had a reason to do so.  I think that she has a girlfriend and wants to be able to live independently with her.  Plan [name] HongJudy will return to see me on or before Apr 16, 2017.  At that time, I will empty refill and reprogram her pump.   Medication List   Accurate as of 11/07/16  1:52 PM.      acetaminophen 325 MG tablet Commonly known as:  TYLENOL Take 650 mg by mouth. Every 6 hours as needed   amantadine 100 MG capsule Commonly known as:  SYMMETREL Take 100 mg by mouth. Take one tablet daily   baclofen 20 MG tablet Commonly known as:  LIORESAL Take 20 mg by mouth 4 (four) times daily. For bladder spasms   bisacodyl 10 MG suppository Commonly known as:  DULCOLAX Place 10 mg rectally as needed for moderate constipation.   clonazePAM 0.5 MG tablet Commonly known as:  KLONOPIN Take 0.5 mg by mouth. Take one three times daily   [employer] MILK OF MAGNESIA 400 MG/5ML suspension Generic drug:  magnesium hydroxide Take by mouth. Take 30ml as needed   dextromethorphan-guaiFENesin 30-600 MG 12hr tablet Commonly known as:  MUCINEX DM Take 1 tablet by mouth 2 (two) times daily.   feeding supplement (ENSURE ENLIVE) Liqd Take 237 mLs by mouth 2 (two) times daily between meals.   flavoxATE 100 MG tablet Commonly known as:  URISPAS Take 100 mg by mouth 3 (three) times daily as needed for bladder spasms.   FLUoxetine 20 MG tablet Commonly known as:  PROZAC Take 20 mg by mouth daily.   ibuprofen 200 MG tablet Commonly known as:  ADVIL,MOTRIN Take 200 mg by mouth. Take 2 tablets every 8 hours as needed   lamoTRIgine 100 MG tablet Commonly known as:  LAMICTAL Take 100 mg by mouth at bedtime.   Melatonin 5 MG Tabs Take by mouth. Take one tablet at bedtime   METAMUCIL FIBER PO Take by mouth. One  tablespoon in 6 oz water or juice daily   multivitamin tablet Take 1 tablet by mouth daily.   omeprazole 20 MG capsule Commonly known as:  PRILOSEC Take 20 mg by mouth. Take one tablet daily   ondansetron 8 MG disintegrating tablet Commonly known as:  ZOFRAN-ODT Take 1 tablet (8 mg total) by mouth every 8 (eight) hours as needed for nausea or vomiting.   oxybutynin 5 MG tablet Commonly known as:  DITROPAN Take 5 mg by mouth. Take one tablet four times a day   oxyCODONE 5 MG immediate release tablet Commonly known as:  ROXICODONE Take one tablet by mouth every 4 hours as needed for pain   promethazine 12.5 MG tablet Commonly known as:  PHENERGAN Take 12.5 mg by mouth every 6 (six) hours as needed for nausea or vomiting.   QUEtiapine 100 MG tablet Commonly known as:  SEROQUEL Take 1 tablet (100 mg total) by mouth at bedtime.   senna 8.6 MG Tabs tablet Commonly  known as:  SENOKOT 2 nightly to help constipation   tiZANidine 4 MG capsule Commonly known as:  ZANAFLEX Take 4 mg by mouth 2 (two) times daily at 8 am and 10 pm.   warfarin 7.5 MG tablet Commonly known as:  COUMADIN 8.5mg  daily to anticoagulate blood to prevent pulmonary emboli. May give as 7.5 mg plus 1 mg tablet daily.     The medication list was reviewed and reconciled. All changes or newly prescribed medications were explained.  A complete medication list was provided to the patient/caregiver.  Deetta Perla MD

## 2016-11-08 DIAGNOSIS — R2681 Unsteadiness on feet: Secondary | ICD-10-CM | POA: Diagnosis not present

## 2016-11-08 DIAGNOSIS — M6281 Muscle weakness (generalized): Secondary | ICD-10-CM | POA: Diagnosis not present

## 2016-11-08 DIAGNOSIS — R41841 Cognitive communication deficit: Secondary | ICD-10-CM | POA: Diagnosis not present

## 2016-11-08 DIAGNOSIS — R293 Abnormal posture: Secondary | ICD-10-CM | POA: Diagnosis not present

## 2016-11-08 DIAGNOSIS — S00511A Abrasion of lip, initial encounter: Secondary | ICD-10-CM | POA: Diagnosis not present

## 2016-11-08 DIAGNOSIS — A419 Sepsis, unspecified organism: Secondary | ICD-10-CM | POA: Diagnosis not present

## 2016-11-08 DIAGNOSIS — R278 Other lack of coordination: Secondary | ICD-10-CM | POA: Diagnosis not present

## 2016-11-11 DIAGNOSIS — R293 Abnormal posture: Secondary | ICD-10-CM | POA: Diagnosis not present

## 2016-11-11 DIAGNOSIS — M6281 Muscle weakness (generalized): Secondary | ICD-10-CM | POA: Diagnosis not present

## 2016-11-11 DIAGNOSIS — R2681 Unsteadiness on feet: Secondary | ICD-10-CM | POA: Diagnosis not present

## 2016-11-11 DIAGNOSIS — R278 Other lack of coordination: Secondary | ICD-10-CM | POA: Diagnosis not present

## 2016-11-11 DIAGNOSIS — R41841 Cognitive communication deficit: Secondary | ICD-10-CM | POA: Diagnosis not present

## 2016-11-11 DIAGNOSIS — A419 Sepsis, unspecified organism: Secondary | ICD-10-CM | POA: Diagnosis not present

## 2016-11-12 ENCOUNTER — Ambulatory Visit (INDEPENDENT_AMBULATORY_CARE_PROVIDER_SITE_OTHER): Payer: Medicare Other | Admitting: Physician Assistant

## 2016-11-12 ENCOUNTER — Encounter (INDEPENDENT_AMBULATORY_CARE_PROVIDER_SITE_OTHER): Payer: Self-pay

## 2016-11-12 ENCOUNTER — Encounter: Payer: Self-pay | Admitting: Physician Assistant

## 2016-11-12 VITALS — BP 80/60 | HR 68

## 2016-11-12 DIAGNOSIS — R41841 Cognitive communication deficit: Secondary | ICD-10-CM | POA: Diagnosis not present

## 2016-11-12 DIAGNOSIS — A419 Sepsis, unspecified organism: Secondary | ICD-10-CM | POA: Diagnosis not present

## 2016-11-12 DIAGNOSIS — M6281 Muscle weakness (generalized): Secondary | ICD-10-CM | POA: Diagnosis not present

## 2016-11-12 DIAGNOSIS — R278 Other lack of coordination: Secondary | ICD-10-CM | POA: Diagnosis not present

## 2016-11-12 DIAGNOSIS — R2681 Unsteadiness on feet: Secondary | ICD-10-CM | POA: Diagnosis not present

## 2016-11-12 DIAGNOSIS — K5909 Other constipation: Secondary | ICD-10-CM | POA: Diagnosis not present

## 2016-11-12 DIAGNOSIS — R293 Abnormal posture: Secondary | ICD-10-CM | POA: Diagnosis not present

## 2016-11-12 MED ORDER — LUBIPROSTONE 24 MCG PO CAPS: 24.0000 ug | ORAL_CAPSULE | Freq: Two times a day (BID) | ORAL | 6 refills | Status: DC

## 2016-11-12 NOTE — Patient Instructions (Signed)
We have given you a printed prescription for Amitiza 24 mcg.  We will fax a copy of the note once the provider dictates it.    Stop the Linzess.  Start the Amitiza 24 mcg. Push fluids.  Use Dulcolax suppositores as needed.  Follow up as needed.

## 2016-11-12 NOTE — Progress Notes (Signed)
Subjective:    Patient ID: [name] [name], female    DOB: 09/22/1976, 40 y.o.   MRN: [id number]  HPI [name] ClosJudith is a pleasant 40 year old white female, new to GI today referred by [facility] Clide DalesSherry Clark NP for evaluation of chronic constipation. Patient states she does not have any prior GI history. Patient has cerebral palsy and is wheelchair-bound. She also has chronic pain syndrome, is narcotic dependent, has urinary incontinence and an indwelling suprapubic catheter, history of recurrent pulmonary emboli on chronic Coumadin and also has an oxygen requirement. She has history of GERD, OCD, endometriosis and is status post cholecystectomy.   Patient says that she has had chronic problems with constipation for the last 10 years, has gradually worsened. She denies any problems with abdominal pain or bloating. She has not noted any melena or hematochezia. She is currently on Linzess 290 mcg daily and says this is not very effective. She still goes as long as a week between bowel movements. She says the rehabilitation Center gives her a suppository every 3-4 days but the suppository generally takes a long time to work. She would like to find a regimen that she can take care of herself as she hopes to move into her own apartment and says she would not be able to give herself a suppository.  He has tried MiraLAX and says that that gave her diarrhea. She went to half a dose daily and that also gave her diarrhea. Family history is negative for colon cancer and polyps as far she is aware  Review of Systems Pertinent positive and negative review of systems were noted in the above HPI section.  All other review of systems was otherwise negative.  Outpatient Encounter Prescriptions as of 11/12/2016  Medication Sig  . acetaminophen (TYLENOL) 325 MG tablet Take 650 mg by mouth. Every 6 hours as needed  . amantadine (SYMMETREL) 100 MG capsule Take 100 mg by mouth. Take one tablet daily  . antiseptic  oral rinse (BIOTENE) LIQD 15 mLs by Mouth Rinse route as needed for dry mouth.  . baclofen (LIORESAL) 20 MG tablet Take 20 mg by mouth 4 (four) times daily. For bladder spasms  . bisacodyl (DULCOLAX) 10 MG suppository Place 10 mg rectally as needed for moderate constipation.  . clonazePAM (KLONOPIN) 0.5 MG tablet Take 0.5 mg by mouth. Take one three times daily  . feeding supplement, ENSURE ENLIVE, (ENSURE ENLIVE) LIQD Take 237 mLs by mouth 2 (two) times daily between meals.  [name] Kitchen. FLUoxetine (PROZAC) 20 MG tablet Take 20 mg by mouth daily.  [name] Kitchen. ibuprofen (ADVIL,MOTRIN) 200 MG tablet Take 200 mg by mouth. Take 2 tablets every 8 hours as needed  . lamoTRIgine (LAMICTAL) 100 MG tablet Take 100 mg by mouth at bedtime.  [name] Kitchen. linaclotide (LINZESS) 290 MCG CAPS capsule Take 290 mcg by mouth daily before breakfast.  . Melatonin 5 MG TABS Take by mouth. Take one tablet at bedtime  . Multiple Vitamins-Minerals (CERTA-VITE PO) Take 1 tablet by mouth daily.  [name] Kitchen. oxybutynin (DITROPAN) 5 MG tablet Take 5 mg by mouth. Take one tablet four times a day  . oxyCODONE (ROXICODONE) 5 MG immediate release tablet Take one tablet by mouth every 4 hours as needed for pain  . QUEtiapine (SEROQUEL) 100 MG tablet Take 1 tablet (100 mg total) by mouth at bedtime.  . senna (SENOKOT) 8.6 MG TABS tablet 2 nightly to help constipation  . tiZANidine (ZANAFLEX) 4 MG capsule Take 4 mg by mouth 2 (  two) times daily at 8 am and 10 pm.  . warfarin (COUMADIN) 7.5 MG tablet 8.5mg  daily to anticoagulate blood to prevent pulmonary emboli. May give as 7.5 mg plus 1 mg tablet daily.  . [DISCONTINUED] flavoxATE (URISPAS) 100 MG tablet Take 100 mg by mouth 3 (three) times daily as needed for bladder spasms.  [name] Kitchen. lubiprostone (AMITIZA) 24 MCG capsule Take 1 capsule (24 mcg total) by mouth 2 (two) times daily with a meal.  . [DISCONTINUED] dextromethorphan-guaiFENesin (MUCINEX DM) 30-600 MG 12hr tablet Take 1 tablet by mouth 2 (two) times daily.  .  [DISCONTINUED] magnesium hydroxide ([employer] MILK OF MAGNESIA) 400 MG/5ML suspension Take by mouth. Take 30ml as needed  . [DISCONTINUED] Multiple Vitamin (MULTIVITAMIN) tablet Take 1 tablet by mouth daily.  . [DISCONTINUED] omeprazole (PRILOSEC) 20 MG capsule Take 20 mg by mouth. Take one tablet daily  . [DISCONTINUED] ondansetron (ZOFRAN-ODT) 8 MG disintegrating tablet Take 1 tablet (8 mg total) by mouth every 8 (eight) hours as needed for nausea or vomiting. (Patient not taking: Reported on 11/12/2016)  . [DISCONTINUED] promethazine (PHENERGAN) 12.5 MG tablet Take 12.5 mg by mouth every 6 (six) hours as needed for nausea or vomiting.  . [DISCONTINUED] Psyllium (METAMUCIL FIBER PO) Take by mouth. One tablespoon in 6 oz water or juice daily   No facility-administered encounter medications on file as of 11/12/2016.    Allergies  Allergen Reactions  . Morphine Dermatitis and Other (See Comments)    Skin turned red   Patient Active Problem List   Diagnosis Date Noted  . Incontinent of feces 03/11/2016  . Chronic anticoagulation 03/11/2016  . Involuntary movements 02/22/2016  . Seizures ([name]) 02/12/2016  . Dyspnea 02/12/2016  . Cerebral palsy, quadriplegic ([name]) 02/06/2016  . Swelling of extremity 01/13/2016  . Pressure ulcer 10/09/2015  . Status post insertion of inferior vena caval filter 09/28/2015  . Absence of bladder continence 08/25/2015  . Person living in residential institution 06/14/2015  . Pulmonary hypertension   . Anemia of chronic disease   . Spasticity 03/15/2015  . Constipation, chronic 10/10/2014  . Athetoid cerebral palsy ([name]) 04/13/2014  . Dystonia 04/12/2014  . Presence of intrathecal baclofen pump 03/07/2014  . Dental caries 07/18/2013  . Multiple thyroid nodules 05/20/2013  . Osteoarthrosis 04/07/2013  . [name]   . Dysarthria   . GERD (gastroesophageal reflux disease)   . Neurogenic bladder 02/05/2013  . Anxiety state 10/21/2012  .  Chronic pain syndrome 06/17/2012  . Recurrent pulmonary embolism ([name]) 05/03/2011  . Pulmonary embolism with infarction ([name]) 05/03/2011  . Chronic suprapubic catheter ([name]) 03/15/2011  . Disease of female genital organs 10/24/2010  . HYPERTENSION, BENIGN 01/31/2010  . CP (cerebral palsy), spastic ([name]) 12/27/2008  . Contracted, joint, multiple sites 12/27/2008  . Major depressive disorder, recurrent episode ([name]) 01/29/2007  . Obsessive Compulsive Disorder 01/29/2007  . Transient alteration of awareness, recurrent 09/08/2006   Social History   Social History  . Marital status: Divorced    Spouse name: N/A  . Number of children: 2  . Years of education: college   Occupational History  .  Unemployed  . UNEMPLOYED Unemployed   Social History Main Topics  . Smoking status: Never Smoker  . Smokeless tobacco: Never Used  . Alcohol use 0.6 oz/week    1 Glasses of wine per week     Comment: Drinks alcohol once a month.  . Drug use: No  . Sexual activity: No   Other Topics Concern  .  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    She enjoys shopping.   Lives in a nursing facility,[facility] since 02/11/16      Ailene Ards, who has schizophrenia, MR, is father of daughter.  Homero Fellers and pt are no longer together.  Homero Fellers does not see [name]. Daughter is 70 yo, Stage manager, lives with pt.        [name] [phone]), lives with pt's parents.      Needs assistance with ADL's and IADL's--has personal care services 20 hrs/wk;       No tobacco, EtOH, drugs.      **Patient very concerned about her kids being taken from her.      **Case Manager: Jack Quarto [phone]      Patient lives [facility]    Ms. Horlacher's family history includes Alzheimer's disease in her paternal grandfather; Asthma in her father; Breast cancer in her paternal grandmother; Cancer in her paternal  grandmother; Colon cancer in her maternal grandmother; Heart attack in her maternal grandfather.      Objective:    Vitals:   11/12/16 1102  BP: (!) 80/60  Pulse: 68    Physical Exam; 40 year old white female with cerebral palsy, she is in a wheelchair, and is contractured. Blood pressure 80/60 pulse 68, not weighed today. HEENT; nontraumatic normocephalic EOMI PERRLA sclera anicteric, Cardiovascular; regular rate and rhythm with S1-S2 no murmur or gallop, Pulmonary; clear bilaterally, Abdomen;soft nondistended nontender bowel sounds are active there is no palpable mass or hepatosplenomegaly, Rectal; exam not done, Extremities; no clubbing cyanosis or edema he has contractures bilaterally upper extremities Neuropsych ;mood and affect appropriate mentation appropriate, speech is very slow       Assessment & Plan:   #67 40 year old white female with cerebral palsy, wheelchair bound with chronic constipation, multifactorial in a patient with multiple medical issues, multiple medications including chronic narcotics #2 chronic pain syndrome-narcotic dependent #3 OCD #4 history of recurrent pulmonary emboli on chronic Coumadin #5oxygen-dependent #6neurogenic bladder indwelling suprapubic catheter #7  history of endometriosis  Plan; stop Linzess Start Amitiza 24 g by mouth twice a day Continue Dulcolax suppositories every 3 days when necessary Patient is advised to follow up as needed with Dr. Christella Hartigan or myself. She is asked to call if the Amitiza is not effective.   [name] S Esterwood PA-C 11/12/2016   Cc: No ref. provider found

## 2016-11-12 NOTE — Progress Notes (Signed)
I agree with the above note, plan 

## 2016-11-13 DIAGNOSIS — R293 Abnormal posture: Secondary | ICD-10-CM | POA: Diagnosis not present

## 2016-11-13 DIAGNOSIS — G808 Other cerebral palsy: Secondary | ICD-10-CM | POA: Diagnosis not present

## 2016-11-13 DIAGNOSIS — R278 Other lack of coordination: Secondary | ICD-10-CM | POA: Diagnosis not present

## 2016-11-13 DIAGNOSIS — M6281 Muscle weakness (generalized): Secondary | ICD-10-CM | POA: Diagnosis not present

## 2016-11-13 DIAGNOSIS — I2699 Other pulmonary embolism without acute cor pulmonale: Secondary | ICD-10-CM | POA: Diagnosis not present

## 2016-11-13 DIAGNOSIS — F418 Other specified anxiety disorders: Secondary | ICD-10-CM | POA: Diagnosis not present

## 2016-11-13 DIAGNOSIS — N318 Other neuromuscular dysfunction of bladder: Secondary | ICD-10-CM | POA: Diagnosis not present

## 2016-11-13 DIAGNOSIS — A419 Sepsis, unspecified organism: Secondary | ICD-10-CM | POA: Diagnosis not present

## 2016-11-13 DIAGNOSIS — R41841 Cognitive communication deficit: Secondary | ICD-10-CM | POA: Diagnosis not present

## 2016-11-13 DIAGNOSIS — R2681 Unsteadiness on feet: Secondary | ICD-10-CM | POA: Diagnosis not present

## 2016-11-14 DIAGNOSIS — A419 Sepsis, unspecified organism: Secondary | ICD-10-CM | POA: Diagnosis not present

## 2016-11-14 DIAGNOSIS — M6281 Muscle weakness (generalized): Secondary | ICD-10-CM | POA: Diagnosis not present

## 2016-11-14 DIAGNOSIS — R41841 Cognitive communication deficit: Secondary | ICD-10-CM | POA: Diagnosis not present

## 2016-11-14 DIAGNOSIS — R293 Abnormal posture: Secondary | ICD-10-CM | POA: Diagnosis not present

## 2016-11-14 DIAGNOSIS — R2681 Unsteadiness on feet: Secondary | ICD-10-CM | POA: Diagnosis not present

## 2016-11-14 DIAGNOSIS — R278 Other lack of coordination: Secondary | ICD-10-CM | POA: Diagnosis not present

## 2016-11-15 DIAGNOSIS — M6281 Muscle weakness (generalized): Secondary | ICD-10-CM | POA: Diagnosis not present

## 2016-11-15 DIAGNOSIS — R2681 Unsteadiness on feet: Secondary | ICD-10-CM | POA: Diagnosis not present

## 2016-11-15 DIAGNOSIS — R293 Abnormal posture: Secondary | ICD-10-CM | POA: Diagnosis not present

## 2016-11-15 DIAGNOSIS — A419 Sepsis, unspecified organism: Secondary | ICD-10-CM | POA: Diagnosis not present

## 2016-11-15 DIAGNOSIS — R278 Other lack of coordination: Secondary | ICD-10-CM | POA: Diagnosis not present

## 2016-11-15 DIAGNOSIS — R41841 Cognitive communication deficit: Secondary | ICD-10-CM | POA: Diagnosis not present

## 2016-11-15 DIAGNOSIS — Z9359 Other cystostomy status: Secondary | ICD-10-CM | POA: Diagnosis not present

## 2016-11-18 DIAGNOSIS — M6281 Muscle weakness (generalized): Secondary | ICD-10-CM | POA: Diagnosis not present

## 2016-11-18 DIAGNOSIS — R278 Other lack of coordination: Secondary | ICD-10-CM | POA: Diagnosis not present

## 2016-11-18 DIAGNOSIS — R293 Abnormal posture: Secondary | ICD-10-CM | POA: Diagnosis not present

## 2016-11-18 DIAGNOSIS — R2681 Unsteadiness on feet: Secondary | ICD-10-CM | POA: Diagnosis not present

## 2016-11-18 DIAGNOSIS — A419 Sepsis, unspecified organism: Secondary | ICD-10-CM | POA: Diagnosis not present

## 2016-11-18 DIAGNOSIS — R41841 Cognitive communication deficit: Secondary | ICD-10-CM | POA: Diagnosis not present

## 2016-11-19 ENCOUNTER — Other Ambulatory Visit: Payer: Self-pay

## 2016-11-19 ENCOUNTER — Telehealth: Payer: Self-pay | Admitting: Physician Assistant

## 2016-11-19 DIAGNOSIS — A419 Sepsis, unspecified organism: Secondary | ICD-10-CM | POA: Diagnosis not present

## 2016-11-19 DIAGNOSIS — M6281 Muscle weakness (generalized): Secondary | ICD-10-CM | POA: Diagnosis not present

## 2016-11-19 DIAGNOSIS — R41841 Cognitive communication deficit: Secondary | ICD-10-CM | POA: Diagnosis not present

## 2016-11-19 DIAGNOSIS — R278 Other lack of coordination: Secondary | ICD-10-CM | POA: Diagnosis not present

## 2016-11-19 DIAGNOSIS — R293 Abnormal posture: Secondary | ICD-10-CM | POA: Diagnosis not present

## 2016-11-19 DIAGNOSIS — R2681 Unsteadiness on feet: Secondary | ICD-10-CM | POA: Diagnosis not present

## 2016-11-19 MED ORDER — POLYETHYLENE GLYCOL 3350 17 GM/SCOOP PO POWD: ORAL | 3 refills | Status: DC

## 2016-11-19 NOTE — Telephone Encounter (Signed)
I am sorry for the incomplete note. She tried and failed Linzess 290 mcg. Now she has faile Amitiza. What are her options now?

## 2016-11-19 NOTE — Telephone Encounter (Signed)
Ok ..please call in rx for Linzess  once daily instead - be sure she and her facility know this is to be given every day, and can still use supp of dulcolax every 3rd day ..she is wheelchair bound, has Cerebral Palsy

## 2016-11-19 NOTE — Telephone Encounter (Signed)
Difficult to communicate with this patient due to her speech. She is reporting she had to use suppositories again this week despite Amitiza. What is to be the next step?

## 2016-11-19 NOTE — Telephone Encounter (Signed)
Order faxed. Spoke with the patient and her nurse.

## 2016-11-19 NOTE — Telephone Encounter (Signed)
Continue Amitiza 24 mcg twice daily and add half dose of miralax every day

## 2016-11-20 DIAGNOSIS — M6281 Muscle weakness (generalized): Secondary | ICD-10-CM | POA: Diagnosis not present

## 2016-11-20 DIAGNOSIS — R293 Abnormal posture: Secondary | ICD-10-CM | POA: Diagnosis not present

## 2016-11-20 DIAGNOSIS — A419 Sepsis, unspecified organism: Secondary | ICD-10-CM | POA: Diagnosis not present

## 2016-11-20 DIAGNOSIS — R2681 Unsteadiness on feet: Secondary | ICD-10-CM | POA: Diagnosis not present

## 2016-11-20 DIAGNOSIS — R41841 Cognitive communication deficit: Secondary | ICD-10-CM | POA: Diagnosis not present

## 2016-11-20 DIAGNOSIS — R278 Other lack of coordination: Secondary | ICD-10-CM | POA: Diagnosis not present

## 2016-11-21 DIAGNOSIS — R278 Other lack of coordination: Secondary | ICD-10-CM | POA: Diagnosis not present

## 2016-11-21 DIAGNOSIS — A419 Sepsis, unspecified organism: Secondary | ICD-10-CM | POA: Diagnosis not present

## 2016-11-21 DIAGNOSIS — R41841 Cognitive communication deficit: Secondary | ICD-10-CM | POA: Diagnosis not present

## 2016-11-21 DIAGNOSIS — M6281 Muscle weakness (generalized): Secondary | ICD-10-CM | POA: Diagnosis not present

## 2016-11-21 DIAGNOSIS — R2681 Unsteadiness on feet: Secondary | ICD-10-CM | POA: Diagnosis not present

## 2016-11-21 DIAGNOSIS — R293 Abnormal posture: Secondary | ICD-10-CM | POA: Diagnosis not present

## 2016-11-22 ENCOUNTER — Ambulatory Visit: Payer: Medicare Other | Admitting: Physical Medicine & Rehabilitation

## 2016-11-22 DIAGNOSIS — R41841 Cognitive communication deficit: Secondary | ICD-10-CM | POA: Diagnosis not present

## 2016-11-22 DIAGNOSIS — R293 Abnormal posture: Secondary | ICD-10-CM | POA: Diagnosis not present

## 2016-11-22 DIAGNOSIS — R2681 Unsteadiness on feet: Secondary | ICD-10-CM | POA: Diagnosis not present

## 2016-11-22 DIAGNOSIS — A419 Sepsis, unspecified organism: Secondary | ICD-10-CM | POA: Diagnosis not present

## 2016-11-22 DIAGNOSIS — M6281 Muscle weakness (generalized): Secondary | ICD-10-CM | POA: Diagnosis not present

## 2016-11-22 DIAGNOSIS — R278 Other lack of coordination: Secondary | ICD-10-CM | POA: Diagnosis not present

## 2016-11-26 DIAGNOSIS — M6281 Muscle weakness (generalized): Secondary | ICD-10-CM | POA: Diagnosis not present

## 2016-11-26 DIAGNOSIS — A419 Sepsis, unspecified organism: Secondary | ICD-10-CM | POA: Diagnosis not present

## 2016-11-26 DIAGNOSIS — R41841 Cognitive communication deficit: Secondary | ICD-10-CM | POA: Diagnosis not present

## 2016-11-26 DIAGNOSIS — R293 Abnormal posture: Secondary | ICD-10-CM | POA: Diagnosis not present

## 2016-11-26 DIAGNOSIS — R2681 Unsteadiness on feet: Secondary | ICD-10-CM | POA: Diagnosis not present

## 2016-11-26 DIAGNOSIS — R278 Other lack of coordination: Secondary | ICD-10-CM | POA: Diagnosis not present

## 2016-11-27 ENCOUNTER — Telehealth: Payer: Self-pay | Admitting: Physician Assistant

## 2016-11-27 DIAGNOSIS — A419 Sepsis, unspecified organism: Secondary | ICD-10-CM | POA: Diagnosis not present

## 2016-11-27 DIAGNOSIS — R278 Other lack of coordination: Secondary | ICD-10-CM | POA: Diagnosis not present

## 2016-11-27 DIAGNOSIS — R293 Abnormal posture: Secondary | ICD-10-CM | POA: Diagnosis not present

## 2016-11-27 DIAGNOSIS — M6281 Muscle weakness (generalized): Secondary | ICD-10-CM | POA: Diagnosis not present

## 2016-11-27 DIAGNOSIS — R2681 Unsteadiness on feet: Secondary | ICD-10-CM | POA: Diagnosis not present

## 2016-11-27 DIAGNOSIS — R41841 Cognitive communication deficit: Secondary | ICD-10-CM | POA: Diagnosis not present

## 2016-11-27 NOTE — Telephone Encounter (Signed)
Read last telephone note when talking to this patient as well as the office note.  I have left her a message to call to discuss.

## 2016-11-28 DIAGNOSIS — R41841 Cognitive communication deficit: Secondary | ICD-10-CM | POA: Diagnosis not present

## 2016-11-28 DIAGNOSIS — R293 Abnormal posture: Secondary | ICD-10-CM | POA: Diagnosis not present

## 2016-11-28 DIAGNOSIS — M6281 Muscle weakness (generalized): Secondary | ICD-10-CM | POA: Diagnosis not present

## 2016-11-28 DIAGNOSIS — R278 Other lack of coordination: Secondary | ICD-10-CM | POA: Diagnosis not present

## 2016-11-28 DIAGNOSIS — A419 Sepsis, unspecified organism: Secondary | ICD-10-CM | POA: Diagnosis not present

## 2016-11-28 DIAGNOSIS — R2681 Unsteadiness on feet: Secondary | ICD-10-CM | POA: Diagnosis not present

## 2016-11-29 ENCOUNTER — Telehealth: Payer: Self-pay | Admitting: Physician Assistant

## 2016-11-29 DIAGNOSIS — R2681 Unsteadiness on feet: Secondary | ICD-10-CM | POA: Diagnosis not present

## 2016-11-29 DIAGNOSIS — A419 Sepsis, unspecified organism: Secondary | ICD-10-CM | POA: Diagnosis not present

## 2016-11-29 DIAGNOSIS — M6281 Muscle weakness (generalized): Secondary | ICD-10-CM | POA: Diagnosis not present

## 2016-11-29 DIAGNOSIS — R278 Other lack of coordination: Secondary | ICD-10-CM | POA: Diagnosis not present

## 2016-11-29 DIAGNOSIS — R41841 Cognitive communication deficit: Secondary | ICD-10-CM | POA: Diagnosis not present

## 2016-11-29 DIAGNOSIS — R293 Abnormal posture: Secondary | ICD-10-CM | POA: Diagnosis not present

## 2016-11-29 NOTE — Telephone Encounter (Signed)
Spoke to patient she is complaining that she has not had a bm since 11/26/16. After reading the last phone notes and office visit, I advised her to contact the nurse at the facility where she lives to see about getting a dulcolax suppository. Orders are in place to use every 3 days if needed. Patient states she is taking the Amitiza and 1/2 dose of Miralax daily. I told patient that I would let the provider know to see if there is some other recommendation to help her.

## 2016-12-03 ENCOUNTER — Telehealth: Payer: Self-pay | Admitting: Physician Assistant

## 2016-12-03 DIAGNOSIS — M6281 Muscle weakness (generalized): Secondary | ICD-10-CM | POA: Diagnosis not present

## 2016-12-03 DIAGNOSIS — R41841 Cognitive communication deficit: Secondary | ICD-10-CM | POA: Diagnosis not present

## 2016-12-03 DIAGNOSIS — R2681 Unsteadiness on feet: Secondary | ICD-10-CM | POA: Diagnosis not present

## 2016-12-03 DIAGNOSIS — R293 Abnormal posture: Secondary | ICD-10-CM | POA: Diagnosis not present

## 2016-12-03 DIAGNOSIS — G809 Cerebral palsy, unspecified: Secondary | ICD-10-CM | POA: Diagnosis not present

## 2016-12-03 DIAGNOSIS — R278 Other lack of coordination: Secondary | ICD-10-CM | POA: Diagnosis not present

## 2016-12-04 DIAGNOSIS — G809 Cerebral palsy, unspecified: Secondary | ICD-10-CM | POA: Diagnosis not present

## 2016-12-04 DIAGNOSIS — M6281 Muscle weakness (generalized): Secondary | ICD-10-CM | POA: Diagnosis not present

## 2016-12-04 DIAGNOSIS — R293 Abnormal posture: Secondary | ICD-10-CM | POA: Diagnosis not present

## 2016-12-04 DIAGNOSIS — R278 Other lack of coordination: Secondary | ICD-10-CM | POA: Diagnosis not present

## 2016-12-04 DIAGNOSIS — R41841 Cognitive communication deficit: Secondary | ICD-10-CM | POA: Diagnosis not present

## 2016-12-04 DIAGNOSIS — R2681 Unsteadiness on feet: Secondary | ICD-10-CM | POA: Diagnosis not present

## 2016-12-04 NOTE — Telephone Encounter (Signed)
Patient calling back to speak to nurse

## 2016-12-05 DIAGNOSIS — M6281 Muscle weakness (generalized): Secondary | ICD-10-CM | POA: Diagnosis not present

## 2016-12-05 DIAGNOSIS — R278 Other lack of coordination: Secondary | ICD-10-CM | POA: Diagnosis not present

## 2016-12-05 DIAGNOSIS — G809 Cerebral palsy, unspecified: Secondary | ICD-10-CM | POA: Diagnosis not present

## 2016-12-05 DIAGNOSIS — R2681 Unsteadiness on feet: Secondary | ICD-10-CM | POA: Diagnosis not present

## 2016-12-05 DIAGNOSIS — R41841 Cognitive communication deficit: Secondary | ICD-10-CM | POA: Diagnosis not present

## 2016-12-05 DIAGNOSIS — F331 Major depressive disorder, recurrent, moderate: Secondary | ICD-10-CM | POA: Diagnosis not present

## 2016-12-05 DIAGNOSIS — R293 Abnormal posture: Secondary | ICD-10-CM | POA: Diagnosis not present

## 2016-12-05 NOTE — Telephone Encounter (Signed)
[facility] Spoke to the patient. Last bm was 12/01/16. Agrees she should ask for a suppository today. Agrees to increasing Miralax. She will stay in touch as to her progress.

## 2016-12-05 NOTE — Telephone Encounter (Signed)
See earlier telephone note--

## 2016-12-05 NOTE — Telephone Encounter (Signed)
Routed to WeltyBeth, who has other information to give to patient.

## 2016-12-05 NOTE — Telephone Encounter (Signed)
She can also increase dose of Miralax to one whole dose daily

## 2016-12-06 DIAGNOSIS — G809 Cerebral palsy, unspecified: Secondary | ICD-10-CM | POA: Diagnosis not present

## 2016-12-06 DIAGNOSIS — R278 Other lack of coordination: Secondary | ICD-10-CM | POA: Diagnosis not present

## 2016-12-06 DIAGNOSIS — R293 Abnormal posture: Secondary | ICD-10-CM | POA: Diagnosis not present

## 2016-12-06 DIAGNOSIS — M6281 Muscle weakness (generalized): Secondary | ICD-10-CM | POA: Diagnosis not present

## 2016-12-06 DIAGNOSIS — R41841 Cognitive communication deficit: Secondary | ICD-10-CM | POA: Diagnosis not present

## 2016-12-06 DIAGNOSIS — R2681 Unsteadiness on feet: Secondary | ICD-10-CM | POA: Diagnosis not present

## 2016-12-07 DIAGNOSIS — M6281 Muscle weakness (generalized): Secondary | ICD-10-CM | POA: Diagnosis not present

## 2016-12-07 DIAGNOSIS — G809 Cerebral palsy, unspecified: Secondary | ICD-10-CM | POA: Diagnosis not present

## 2016-12-07 DIAGNOSIS — R41841 Cognitive communication deficit: Secondary | ICD-10-CM | POA: Diagnosis not present

## 2016-12-07 DIAGNOSIS — R2681 Unsteadiness on feet: Secondary | ICD-10-CM | POA: Diagnosis not present

## 2016-12-07 DIAGNOSIS — R293 Abnormal posture: Secondary | ICD-10-CM | POA: Diagnosis not present

## 2016-12-07 DIAGNOSIS — R278 Other lack of coordination: Secondary | ICD-10-CM | POA: Diagnosis not present

## 2016-12-09 DIAGNOSIS — M6281 Muscle weakness (generalized): Secondary | ICD-10-CM | POA: Diagnosis not present

## 2016-12-09 DIAGNOSIS — R278 Other lack of coordination: Secondary | ICD-10-CM | POA: Diagnosis not present

## 2016-12-09 DIAGNOSIS — R2681 Unsteadiness on feet: Secondary | ICD-10-CM | POA: Diagnosis not present

## 2016-12-09 DIAGNOSIS — R293 Abnormal posture: Secondary | ICD-10-CM | POA: Diagnosis not present

## 2016-12-09 DIAGNOSIS — G809 Cerebral palsy, unspecified: Secondary | ICD-10-CM | POA: Diagnosis not present

## 2016-12-09 DIAGNOSIS — R41841 Cognitive communication deficit: Secondary | ICD-10-CM | POA: Diagnosis not present

## 2016-12-10 ENCOUNTER — Ambulatory Visit: Payer: Medicare Other | Admitting: Physical Medicine & Rehabilitation

## 2016-12-10 ENCOUNTER — Encounter: Payer: Medicare Other | Attending: Physical Medicine & Rehabilitation

## 2016-12-10 DIAGNOSIS — R2681 Unsteadiness on feet: Secondary | ICD-10-CM | POA: Diagnosis not present

## 2016-12-10 DIAGNOSIS — R278 Other lack of coordination: Secondary | ICD-10-CM | POA: Diagnosis not present

## 2016-12-10 DIAGNOSIS — M6281 Muscle weakness (generalized): Secondary | ICD-10-CM | POA: Diagnosis not present

## 2016-12-10 DIAGNOSIS — R41841 Cognitive communication deficit: Secondary | ICD-10-CM | POA: Diagnosis not present

## 2016-12-10 DIAGNOSIS — G809 Cerebral palsy, unspecified: Secondary | ICD-10-CM | POA: Diagnosis not present

## 2016-12-10 DIAGNOSIS — R293 Abnormal posture: Secondary | ICD-10-CM | POA: Diagnosis not present

## 2016-12-10 DIAGNOSIS — R252 Cramp and spasm: Secondary | ICD-10-CM | POA: Insufficient documentation

## 2016-12-11 DIAGNOSIS — R293 Abnormal posture: Secondary | ICD-10-CM | POA: Diagnosis not present

## 2016-12-11 DIAGNOSIS — G809 Cerebral palsy, unspecified: Secondary | ICD-10-CM | POA: Diagnosis not present

## 2016-12-11 DIAGNOSIS — R2681 Unsteadiness on feet: Secondary | ICD-10-CM | POA: Diagnosis not present

## 2016-12-11 DIAGNOSIS — R41841 Cognitive communication deficit: Secondary | ICD-10-CM | POA: Diagnosis not present

## 2016-12-11 DIAGNOSIS — M6281 Muscle weakness (generalized): Secondary | ICD-10-CM | POA: Diagnosis not present

## 2016-12-11 DIAGNOSIS — R278 Other lack of coordination: Secondary | ICD-10-CM | POA: Diagnosis not present

## 2016-12-11 DIAGNOSIS — Z7901 Long term (current) use of anticoagulants: Secondary | ICD-10-CM | POA: Diagnosis not present

## 2016-12-11 DIAGNOSIS — Z79899 Other long term (current) drug therapy: Secondary | ICD-10-CM | POA: Diagnosis not present

## 2016-12-11 DIAGNOSIS — H2513 Age-related nuclear cataract, bilateral: Secondary | ICD-10-CM | POA: Diagnosis not present

## 2016-12-12 ENCOUNTER — Telehealth: Payer: Self-pay | Admitting: Physician Assistant

## 2016-12-12 DIAGNOSIS — R2681 Unsteadiness on feet: Secondary | ICD-10-CM | POA: Diagnosis not present

## 2016-12-12 DIAGNOSIS — M6281 Muscle weakness (generalized): Secondary | ICD-10-CM | POA: Diagnosis not present

## 2016-12-12 DIAGNOSIS — R293 Abnormal posture: Secondary | ICD-10-CM | POA: Diagnosis not present

## 2016-12-12 DIAGNOSIS — R41841 Cognitive communication deficit: Secondary | ICD-10-CM | POA: Diagnosis not present

## 2016-12-12 DIAGNOSIS — G809 Cerebral palsy, unspecified: Secondary | ICD-10-CM | POA: Diagnosis not present

## 2016-12-12 DIAGNOSIS — R278 Other lack of coordination: Secondary | ICD-10-CM | POA: Diagnosis not present

## 2016-12-12 NOTE — Telephone Encounter (Signed)
Patient reports that she is having terrible diarrhea on Linzess and Miralax. Per the office notes from 11/12/16 Linzess was to be discontinued and she was to start Amitiza 24 mcg BID and Miralax 1/2- 1 capful a day.  May use Dulcolax Suppository every 3rd days if the above is ineffective. I spoke with patient her nurse Afo.  Orders faxed to [phone] per Afo's request.       Amitiza 1 po BID Mirlax 1/2- 1 capful daily po Dulcolax suppository per rectum every 3rd day if the above is not effective.

## 2016-12-13 DIAGNOSIS — R278 Other lack of coordination: Secondary | ICD-10-CM | POA: Diagnosis not present

## 2016-12-13 DIAGNOSIS — G809 Cerebral palsy, unspecified: Secondary | ICD-10-CM | POA: Diagnosis not present

## 2016-12-13 DIAGNOSIS — M6281 Muscle weakness (generalized): Secondary | ICD-10-CM | POA: Diagnosis not present

## 2016-12-13 DIAGNOSIS — R293 Abnormal posture: Secondary | ICD-10-CM | POA: Diagnosis not present

## 2016-12-13 DIAGNOSIS — R2681 Unsteadiness on feet: Secondary | ICD-10-CM | POA: Diagnosis not present

## 2016-12-13 DIAGNOSIS — R41841 Cognitive communication deficit: Secondary | ICD-10-CM | POA: Diagnosis not present

## 2016-12-16 DIAGNOSIS — R293 Abnormal posture: Secondary | ICD-10-CM | POA: Diagnosis not present

## 2016-12-16 DIAGNOSIS — R278 Other lack of coordination: Secondary | ICD-10-CM | POA: Diagnosis not present

## 2016-12-16 DIAGNOSIS — G809 Cerebral palsy, unspecified: Secondary | ICD-10-CM | POA: Diagnosis not present

## 2016-12-16 DIAGNOSIS — M6281 Muscle weakness (generalized): Secondary | ICD-10-CM | POA: Diagnosis not present

## 2016-12-16 DIAGNOSIS — R41841 Cognitive communication deficit: Secondary | ICD-10-CM | POA: Diagnosis not present

## 2016-12-16 DIAGNOSIS — R2681 Unsteadiness on feet: Secondary | ICD-10-CM | POA: Diagnosis not present

## 2016-12-19 DIAGNOSIS — R2681 Unsteadiness on feet: Secondary | ICD-10-CM | POA: Diagnosis not present

## 2016-12-19 DIAGNOSIS — R41841 Cognitive communication deficit: Secondary | ICD-10-CM | POA: Diagnosis not present

## 2016-12-19 DIAGNOSIS — M6281 Muscle weakness (generalized): Secondary | ICD-10-CM | POA: Diagnosis not present

## 2016-12-19 DIAGNOSIS — G809 Cerebral palsy, unspecified: Secondary | ICD-10-CM | POA: Diagnosis not present

## 2016-12-19 DIAGNOSIS — R278 Other lack of coordination: Secondary | ICD-10-CM | POA: Diagnosis not present

## 2016-12-19 DIAGNOSIS — R293 Abnormal posture: Secondary | ICD-10-CM | POA: Diagnosis not present

## 2016-12-20 DIAGNOSIS — R293 Abnormal posture: Secondary | ICD-10-CM | POA: Diagnosis not present

## 2016-12-20 DIAGNOSIS — R2681 Unsteadiness on feet: Secondary | ICD-10-CM | POA: Diagnosis not present

## 2016-12-20 DIAGNOSIS — R41841 Cognitive communication deficit: Secondary | ICD-10-CM | POA: Diagnosis not present

## 2016-12-20 DIAGNOSIS — R278 Other lack of coordination: Secondary | ICD-10-CM | POA: Diagnosis not present

## 2016-12-20 DIAGNOSIS — G809 Cerebral palsy, unspecified: Secondary | ICD-10-CM | POA: Diagnosis not present

## 2016-12-20 DIAGNOSIS — M6281 Muscle weakness (generalized): Secondary | ICD-10-CM | POA: Diagnosis not present

## 2016-12-21 DIAGNOSIS — R293 Abnormal posture: Secondary | ICD-10-CM | POA: Diagnosis not present

## 2016-12-21 DIAGNOSIS — R41841 Cognitive communication deficit: Secondary | ICD-10-CM | POA: Diagnosis not present

## 2016-12-21 DIAGNOSIS — R278 Other lack of coordination: Secondary | ICD-10-CM | POA: Diagnosis not present

## 2016-12-21 DIAGNOSIS — R2681 Unsteadiness on feet: Secondary | ICD-10-CM | POA: Diagnosis not present

## 2016-12-21 DIAGNOSIS — M6281 Muscle weakness (generalized): Secondary | ICD-10-CM | POA: Diagnosis not present

## 2016-12-21 DIAGNOSIS — G809 Cerebral palsy, unspecified: Secondary | ICD-10-CM | POA: Diagnosis not present

## 2016-12-23 DIAGNOSIS — R278 Other lack of coordination: Secondary | ICD-10-CM | POA: Diagnosis not present

## 2016-12-23 DIAGNOSIS — R2681 Unsteadiness on feet: Secondary | ICD-10-CM | POA: Diagnosis not present

## 2016-12-23 DIAGNOSIS — R293 Abnormal posture: Secondary | ICD-10-CM | POA: Diagnosis not present

## 2016-12-23 DIAGNOSIS — G809 Cerebral palsy, unspecified: Secondary | ICD-10-CM | POA: Diagnosis not present

## 2016-12-23 DIAGNOSIS — M6281 Muscle weakness (generalized): Secondary | ICD-10-CM | POA: Diagnosis not present

## 2016-12-23 DIAGNOSIS — R41841 Cognitive communication deficit: Secondary | ICD-10-CM | POA: Diagnosis not present

## 2016-12-24 DIAGNOSIS — R293 Abnormal posture: Secondary | ICD-10-CM | POA: Diagnosis not present

## 2016-12-24 DIAGNOSIS — R278 Other lack of coordination: Secondary | ICD-10-CM | POA: Diagnosis not present

## 2016-12-24 DIAGNOSIS — Z9359 Other cystostomy status: Secondary | ICD-10-CM | POA: Diagnosis not present

## 2016-12-24 DIAGNOSIS — M6281 Muscle weakness (generalized): Secondary | ICD-10-CM | POA: Diagnosis not present

## 2016-12-24 DIAGNOSIS — R2681 Unsteadiness on feet: Secondary | ICD-10-CM | POA: Diagnosis not present

## 2016-12-24 DIAGNOSIS — R41841 Cognitive communication deficit: Secondary | ICD-10-CM | POA: Diagnosis not present

## 2016-12-24 DIAGNOSIS — G809 Cerebral palsy, unspecified: Secondary | ICD-10-CM | POA: Diagnosis not present

## 2016-12-25 ENCOUNTER — Telehealth: Payer: Self-pay | Admitting: Physician Assistant

## 2016-12-25 DIAGNOSIS — R293 Abnormal posture: Secondary | ICD-10-CM | POA: Diagnosis not present

## 2016-12-25 DIAGNOSIS — G809 Cerebral palsy, unspecified: Secondary | ICD-10-CM | POA: Diagnosis not present

## 2016-12-25 DIAGNOSIS — M6281 Muscle weakness (generalized): Secondary | ICD-10-CM | POA: Diagnosis not present

## 2016-12-25 DIAGNOSIS — R278 Other lack of coordination: Secondary | ICD-10-CM | POA: Diagnosis not present

## 2016-12-25 DIAGNOSIS — R41841 Cognitive communication deficit: Secondary | ICD-10-CM | POA: Diagnosis not present

## 2016-12-25 DIAGNOSIS — R2681 Unsteadiness on feet: Secondary | ICD-10-CM | POA: Diagnosis not present

## 2016-12-26 DIAGNOSIS — R2681 Unsteadiness on feet: Secondary | ICD-10-CM | POA: Diagnosis not present

## 2016-12-26 DIAGNOSIS — R278 Other lack of coordination: Secondary | ICD-10-CM | POA: Diagnosis not present

## 2016-12-26 DIAGNOSIS — M6281 Muscle weakness (generalized): Secondary | ICD-10-CM | POA: Diagnosis not present

## 2016-12-26 DIAGNOSIS — R293 Abnormal posture: Secondary | ICD-10-CM | POA: Diagnosis not present

## 2016-12-26 DIAGNOSIS — F331 Major depressive disorder, recurrent, moderate: Secondary | ICD-10-CM | POA: Diagnosis not present

## 2016-12-26 DIAGNOSIS — R41841 Cognitive communication deficit: Secondary | ICD-10-CM | POA: Diagnosis not present

## 2016-12-26 DIAGNOSIS — G809 Cerebral palsy, unspecified: Secondary | ICD-10-CM | POA: Diagnosis not present

## 2016-12-27 DIAGNOSIS — R293 Abnormal posture: Secondary | ICD-10-CM | POA: Diagnosis not present

## 2016-12-27 DIAGNOSIS — R278 Other lack of coordination: Secondary | ICD-10-CM | POA: Diagnosis not present

## 2016-12-27 DIAGNOSIS — R41841 Cognitive communication deficit: Secondary | ICD-10-CM | POA: Diagnosis not present

## 2016-12-27 DIAGNOSIS — R2681 Unsteadiness on feet: Secondary | ICD-10-CM | POA: Diagnosis not present

## 2016-12-27 DIAGNOSIS — M6281 Muscle weakness (generalized): Secondary | ICD-10-CM | POA: Diagnosis not present

## 2016-12-27 DIAGNOSIS — I2690 Septic pulmonary embolism without acute cor pulmonale: Secondary | ICD-10-CM | POA: Diagnosis not present

## 2016-12-27 DIAGNOSIS — G809 Cerebral palsy, unspecified: Secondary | ICD-10-CM | POA: Diagnosis not present

## 2016-12-27 NOTE — Telephone Encounter (Signed)
Patient has completely stopped Miralax. She is taking the Amitiza as ordered. She would like to discuss changes to her regimen again. Phone communication is difficult. Appointment scheduled for her on Wednesday at 3:00 pm.

## 2016-12-29 DIAGNOSIS — I2699 Other pulmonary embolism without acute cor pulmonale: Secondary | ICD-10-CM | POA: Diagnosis not present

## 2016-12-29 DIAGNOSIS — N318 Other neuromuscular dysfunction of bladder: Secondary | ICD-10-CM | POA: Diagnosis not present

## 2016-12-29 DIAGNOSIS — G808 Other cerebral palsy: Secondary | ICD-10-CM | POA: Diagnosis not present

## 2016-12-29 DIAGNOSIS — K5909 Other constipation: Secondary | ICD-10-CM | POA: Diagnosis not present

## 2016-12-30 DIAGNOSIS — R278 Other lack of coordination: Secondary | ICD-10-CM | POA: Diagnosis not present

## 2016-12-30 DIAGNOSIS — R41841 Cognitive communication deficit: Secondary | ICD-10-CM | POA: Diagnosis not present

## 2016-12-30 DIAGNOSIS — R293 Abnormal posture: Secondary | ICD-10-CM | POA: Diagnosis not present

## 2016-12-30 DIAGNOSIS — M6281 Muscle weakness (generalized): Secondary | ICD-10-CM | POA: Diagnosis not present

## 2016-12-30 DIAGNOSIS — G809 Cerebral palsy, unspecified: Secondary | ICD-10-CM | POA: Diagnosis not present

## 2016-12-30 DIAGNOSIS — R2681 Unsteadiness on feet: Secondary | ICD-10-CM | POA: Diagnosis not present

## 2016-12-31 DIAGNOSIS — R41841 Cognitive communication deficit: Secondary | ICD-10-CM | POA: Diagnosis not present

## 2016-12-31 DIAGNOSIS — G809 Cerebral palsy, unspecified: Secondary | ICD-10-CM | POA: Diagnosis not present

## 2016-12-31 DIAGNOSIS — M6281 Muscle weakness (generalized): Secondary | ICD-10-CM | POA: Diagnosis not present

## 2016-12-31 DIAGNOSIS — R278 Other lack of coordination: Secondary | ICD-10-CM | POA: Diagnosis not present

## 2016-12-31 DIAGNOSIS — R293 Abnormal posture: Secondary | ICD-10-CM | POA: Diagnosis not present

## 2016-12-31 DIAGNOSIS — R2681 Unsteadiness on feet: Secondary | ICD-10-CM | POA: Diagnosis not present

## 2017-01-01 ENCOUNTER — Ambulatory Visit (INDEPENDENT_AMBULATORY_CARE_PROVIDER_SITE_OTHER): Payer: Medicare Other | Admitting: Physician Assistant

## 2017-01-01 ENCOUNTER — Encounter: Payer: Self-pay | Admitting: Physician Assistant

## 2017-01-01 VITALS — BP 106/76 | HR 92 | Ht <= 58 in | Wt 107.0 lb

## 2017-01-01 DIAGNOSIS — K5909 Other constipation: Secondary | ICD-10-CM | POA: Diagnosis not present

## 2017-01-01 DIAGNOSIS — R2681 Unsteadiness on feet: Secondary | ICD-10-CM | POA: Diagnosis not present

## 2017-01-01 DIAGNOSIS — M6281 Muscle weakness (generalized): Secondary | ICD-10-CM | POA: Diagnosis not present

## 2017-01-01 DIAGNOSIS — R293 Abnormal posture: Secondary | ICD-10-CM | POA: Diagnosis not present

## 2017-01-01 DIAGNOSIS — G809 Cerebral palsy, unspecified: Secondary | ICD-10-CM | POA: Diagnosis not present

## 2017-01-01 DIAGNOSIS — R41841 Cognitive communication deficit: Secondary | ICD-10-CM | POA: Diagnosis not present

## 2017-01-01 DIAGNOSIS — R278 Other lack of coordination: Secondary | ICD-10-CM | POA: Diagnosis not present

## 2017-01-01 MED ORDER — POLYETHYLENE GLYCOL 3350 17 GM/SCOOP PO POWD: ORAL | 3 refills | Status: AC

## 2017-01-01 MED ORDER — LUBIPROSTONE 24 MCG PO CAPS: 24.0000 ug | ORAL_CAPSULE | Freq: Two times a day (BID) | ORAL | 11 refills | Status: DC

## 2017-01-01 MED ORDER — FLEET ENEMA 7-19 GM/118ML RE ENEM: 1.0000 | ENEMA | RECTAL | 1 refills | Status: DC

## 2017-01-01 NOTE — Patient Instructions (Signed)
Continue the Senekot, 2 tab at bedtime. Continue Amitiza 24 mcg twice daily. Increase Miralax to 1 1/2 doses daily in 12 oz of water.   Use Fleet emema,e very 3-4 days as needed for constipation.

## 2017-01-01 NOTE — Progress Notes (Signed)
I agree with the above note, plan 

## 2017-01-01 NOTE — Progress Notes (Signed)
Subjective:    Patient ID: [name] [name], female    DOB: 02/10/1976, 41 y.o.   MRN: [id number]  HPI [name] [name] is a 41 year old white female with cerebral palsy, wheelchair bound and a resident of [place]. She was established with our practice in December 2017 when seen for chronic constipation. Patient has history of recurrent pulmonary emboli, on chronic Coumadin, hypertension, GERD, chronic constipation, seizure disorder, chronic pain syndrome for which she is narcotic dependent, history of OCD and is status post cholecystectomy. She had been on lens S2 190 g daily at the time of initial office visit but stated that wasn't working very well and only producing a bowel movement once a week or so. We switched her to Amitiza 24 g by mouth twice daily, advised continuing Dulcolax suppositories every 3-4 days which she does not like to do because she is trying to become independent and get in her own apartment. She says she will not be able to do suppositories on her own. She was also advised to drink MiraLAX on a daily basis. She comes back in today to discuss her regimen. She frequently will develop diarrhea if she takes MiraLAX for too many days in a row or more than 1 dose daily, however over the past 3 days she has been taking MiraLAX daily and has not had a bowel movement in 5 days. She has not requested the suppository. She also says that she feels that Fleet's enemas work better than suppositories. Her primary goal seems to be to find a bowel regimen that she can manage on her own when she eventually gets her own apartment. She is trying not to depend on nursing for suppositories or enemas.  Review of Systems Pertinent positive and negative review of systems were noted in the above HPI section.  All other review of systems was otherwise negative.  Outpatient Encounter Prescriptions as of 01/01/2017  Medication Sig  . acetaminophen (TYLENOL) 325 MG tablet Take 650 mg by mouth. Every 6 hours as  needed  . amantadine (SYMMETREL) 100 MG capsule Take 100 mg by mouth. Take one tablet daily  . antiseptic oral rinse (BIOTENE) LIQD 15 mLs by Mouth Rinse route as needed for dry mouth.  . baclofen (LIORESAL) 20 MG tablet Take 20 mg by mouth 4 (four) times daily. For bladder spasms  . bisacodyl (DULCOLAX) 10 MG suppository Place 10 mg rectally as needed for moderate constipation.  . clonazePAM (KLONOPIN) 0.5 MG tablet Take 0.5 mg by mouth. Take one three times daily  . feeding supplement, ENSURE ENLIVE, (ENSURE ENLIVE) LIQD Take 237 mLs by mouth 2 (two) times daily between meals.  [name] Kitchen FLUoxetine (PROZAC) 20 MG tablet Take 20 mg by mouth daily.  [name] Kitchen ibuprofen (ADVIL,MOTRIN) 200 MG tablet Take 200 mg by mouth. Take 2 tablets every 8 hours as needed  . lamoTRIgine (LAMICTAL) 100 MG tablet Take 100 mg by mouth at bedtime.  [name] Kitchen lubiprostone (AMITIZA) 24 MCG capsule Take 1 capsule (24 mcg total) by mouth 2 (two) times daily with a meal.  . Melatonin 5 MG TABS Take by mouth. Take one tablet at bedtime  . Multiple Vitamins-Minerals (CERTA-VITE PO) Take 1 tablet by mouth daily.  [name] Kitchen oxybutynin (DITROPAN) 5 MG tablet Take 5 mg by mouth. Take one tablet four times a day  . oxyCODONE (ROXICODONE) 5 MG immediate release tablet Take one tablet by mouth every 4 hours as needed for pain  . polyethylene glycol powder (GLYCOLAX/MIRALAX) powder Take 1  1/2 dose  daily in 12 ounces of fluid every day.  [name] Kitchen. QUEtiapine (SEROQUEL) 100 MG tablet Take 1 tablet (100 mg total) by mouth at bedtime.  . senna (SENOKOT) 8.6 MG TABS tablet 2 nightly to help constipation  . [START ON 01/02/2017] sodium phosphate (FLEET) 7-19 GM/118ML ENEM Place 133 mLs (1 enema total) rectally 2 (two) times a week.  [name] Kitchen. tiZANidine (ZANAFLEX) 4 MG capsule Take 4 mg by mouth 2 (two) times daily at 8 am and 10 pm.  . warfarin (COUMADIN) 7.5 MG tablet 8.5mg  daily to anticoagulate blood to prevent pulmonary emboli. May give as 7.5 mg plus 1 mg tablet daily.  .  [DISCONTINUED] lubiprostone (AMITIZA) 24 MCG capsule Take 1 capsule (24 mcg total) by mouth 2 (two) times daily with a meal.  . [DISCONTINUED] polyethylene glycol powder (GLYCOLAX/MIRALAX) powder Take 1/2 dose (1/2 capful) daily in 8 ounces of fluid every day.   No facility-administered encounter medications on file as of 01/01/2017.    Allergies  Allergen Reactions  . Morphine Dermatitis and Other (See Comments)    Skin turned red   Patient Active Problem List   Diagnosis Date Noted  . Incontinent of feces 03/11/2016  . Chronic anticoagulation 03/11/2016  . Involuntary movements 02/22/2016  . Seizures ([name]) 02/12/2016  . Dyspnea 02/12/2016  . Cerebral palsy, quadriplegic ([name]) 02/06/2016  . Swelling of extremity 01/13/2016  . Pressure ulcer 10/09/2015  . Status post insertion of inferior vena caval filter 09/28/2015  . Absence of bladder continence 08/25/2015  . Person living in residential institution 06/14/2015  . Pulmonary hypertension   . Anemia of chronic disease   . Spasticity 03/15/2015  . Constipation, chronic 10/10/2014  . Athetoid cerebral palsy ([name]) 04/13/2014  . Dystonia 04/12/2014  . Presence of intrathecal baclofen pump 03/07/2014  . Dental caries 07/18/2013  . Multiple thyroid nodules 05/20/2013  . Osteoarthrosis 04/07/2013  . [name]   . Dysarthria   . GERD (gastroesophageal reflux disease)   . Neurogenic bladder 02/05/2013  . Anxiety state 10/21/2012  . Chronic pain syndrome 06/17/2012  . Recurrent pulmonary embolism ([name]) 05/03/2011  . Pulmonary embolism with infarction ([name]) 05/03/2011  . Chronic suprapubic catheter ([name]) 03/15/2011  . Disease of female genital organs 10/24/2010  . HYPERTENSION, BENIGN 01/31/2010  . CP (cerebral palsy), spastic ([name]) 12/27/2008  . Contracted, joint, multiple sites 12/27/2008  . Major depressive disorder, recurrent episode ([name]) 01/29/2007  . Obsessive Compulsive Disorder 01/29/2007  .  Transient alteration of awareness, recurrent 09/08/2006   Social History   Social History  . Marital status: Divorced    Spouse name: N/A  . Number of children: 2  . Years of education: college   Occupational History  .  Unemployed   Social History Main Topics  . Smoking status: Never Smoker  . Smokeless tobacco: Never Used  . Alcohol use 0.6 oz/week    1 Glasses of wine per week     Comment: Drinks alcohol once a month.  . Drug use: No  . Sexual activity: No   Other Topics Concern  . Not on file   Social History Narrative   [name] graduated from college with an Scientist, research (physical sciences)Associates Degree in [employer].    She enjoys shopping.   Lives in a nursing facility,[facility] since 02/11/16      Ailene ArdsFrank Haith, who has schizophrenia, MR, is father of daughter.  Homero FellersFrank and pt are no longer together.  Homero FellersFrank does not see Norberta KeensKiarra. Daughter is 8  yo, Levy Sjogren, lives with pt.        [name] [phone]), lives with pt's parents.      Needs assistance with ADL's and IADL's--has personal care services 20 hrs/wk;       No tobacco, EtOH, drugs.      **Patient very concerned about her kids being taken from her.      **Case Manager: Jack Quarto [phone]      Patient lives [facility]    Ms. Palau's family history includes Alzheimer's disease in her paternal grandfather; Asthma in her father; Breast cancer in her paternal grandmother; Colon cancer in her maternal grandmother; Heart attack in her maternal grandfather.      Objective:    Vitals:   01/01/17 1509  BP: 106/76  Pulse: 92    Physical Exam  well-developed white female, wheelchair bound with cerebral palsy. Blood pressure 106/76, pulse 92, height 4 foot 9, weight 107, BMI 23.1. HEENT; patient with significant spasticity of the neck, unable to lift her head. Cardiovascular; regular rate and rhythm with S1-S2 no murmur or gallop, Pulmonary;clear bilaterally, Abdomen; soft, nondistended bowel  sounds are present she has a baclofen pump in the right mid abdomen, no mass or hepatosplenomegaly, Rectal; exam not done       Assessment & Plan:   #36 41 year old female with cerebral palsy, quadriplegic with chronic constipation which is multifactorial with narcotic dependence, and multiple medications #2 history of recurrent pulmonary emboli on chronic Coumadin #3 seizure disorder #4 OCD #5 neurogenic bladder  Plan; continue Amitiza 24 g by mouth twice a day, multiple refills sent Continue Senokot 2 by mouth daily at bedtime Increase MiraLAX to one and a half doses daily in at least 12 ounces of water Fleets enema every 3-4 days if no bowel movement in the interim Patient will follow-up with Dr. Christella Hartigan or myself on an as-needed basis.    Vanya Carberry S Mohsen Odenthal PA-C 01/01/2017   Cc: No ref. provider found

## 2017-01-02 DIAGNOSIS — R278 Other lack of coordination: Secondary | ICD-10-CM | POA: Diagnosis not present

## 2017-01-02 DIAGNOSIS — R41841 Cognitive communication deficit: Secondary | ICD-10-CM | POA: Diagnosis not present

## 2017-01-02 DIAGNOSIS — R2681 Unsteadiness on feet: Secondary | ICD-10-CM | POA: Diagnosis not present

## 2017-01-02 DIAGNOSIS — R293 Abnormal posture: Secondary | ICD-10-CM | POA: Diagnosis not present

## 2017-01-02 DIAGNOSIS — F422 Mixed obsessional thoughts and acts: Secondary | ICD-10-CM | POA: Diagnosis not present

## 2017-01-02 DIAGNOSIS — M6281 Muscle weakness (generalized): Secondary | ICD-10-CM | POA: Diagnosis not present

## 2017-01-02 DIAGNOSIS — F331 Major depressive disorder, recurrent, moderate: Secondary | ICD-10-CM | POA: Diagnosis not present

## 2017-01-02 DIAGNOSIS — Z79899 Other long term (current) drug therapy: Secondary | ICD-10-CM | POA: Diagnosis not present

## 2017-01-02 DIAGNOSIS — G809 Cerebral palsy, unspecified: Secondary | ICD-10-CM | POA: Diagnosis not present

## 2017-01-03 DIAGNOSIS — R41841 Cognitive communication deficit: Secondary | ICD-10-CM | POA: Diagnosis not present

## 2017-01-03 DIAGNOSIS — R293 Abnormal posture: Secondary | ICD-10-CM | POA: Diagnosis not present

## 2017-01-03 DIAGNOSIS — M6281 Muscle weakness (generalized): Secondary | ICD-10-CM | POA: Diagnosis not present

## 2017-01-03 DIAGNOSIS — R2681 Unsteadiness on feet: Secondary | ICD-10-CM | POA: Diagnosis not present

## 2017-01-03 DIAGNOSIS — R278 Other lack of coordination: Secondary | ICD-10-CM | POA: Diagnosis not present

## 2017-01-03 DIAGNOSIS — G809 Cerebral palsy, unspecified: Secondary | ICD-10-CM | POA: Diagnosis not present

## 2017-01-06 DIAGNOSIS — R41841 Cognitive communication deficit: Secondary | ICD-10-CM | POA: Diagnosis not present

## 2017-01-06 DIAGNOSIS — R278 Other lack of coordination: Secondary | ICD-10-CM | POA: Diagnosis not present

## 2017-01-06 DIAGNOSIS — R293 Abnormal posture: Secondary | ICD-10-CM | POA: Diagnosis not present

## 2017-01-06 DIAGNOSIS — R2681 Unsteadiness on feet: Secondary | ICD-10-CM | POA: Diagnosis not present

## 2017-01-06 DIAGNOSIS — G809 Cerebral palsy, unspecified: Secondary | ICD-10-CM | POA: Diagnosis not present

## 2017-01-06 DIAGNOSIS — M6281 Muscle weakness (generalized): Secondary | ICD-10-CM | POA: Diagnosis not present

## 2017-01-07 DIAGNOSIS — R293 Abnormal posture: Secondary | ICD-10-CM | POA: Diagnosis not present

## 2017-01-07 DIAGNOSIS — M6281 Muscle weakness (generalized): Secondary | ICD-10-CM | POA: Diagnosis not present

## 2017-01-07 DIAGNOSIS — R278 Other lack of coordination: Secondary | ICD-10-CM | POA: Diagnosis not present

## 2017-01-07 DIAGNOSIS — R41841 Cognitive communication deficit: Secondary | ICD-10-CM | POA: Diagnosis not present

## 2017-01-07 DIAGNOSIS — R2681 Unsteadiness on feet: Secondary | ICD-10-CM | POA: Diagnosis not present

## 2017-01-07 DIAGNOSIS — G809 Cerebral palsy, unspecified: Secondary | ICD-10-CM | POA: Diagnosis not present

## 2017-01-08 DIAGNOSIS — R293 Abnormal posture: Secondary | ICD-10-CM | POA: Diagnosis not present

## 2017-01-08 DIAGNOSIS — M6281 Muscle weakness (generalized): Secondary | ICD-10-CM | POA: Diagnosis not present

## 2017-01-08 DIAGNOSIS — G809 Cerebral palsy, unspecified: Secondary | ICD-10-CM | POA: Diagnosis not present

## 2017-01-08 DIAGNOSIS — R41841 Cognitive communication deficit: Secondary | ICD-10-CM | POA: Diagnosis not present

## 2017-01-08 DIAGNOSIS — R278 Other lack of coordination: Secondary | ICD-10-CM | POA: Diagnosis not present

## 2017-01-08 DIAGNOSIS — R2681 Unsteadiness on feet: Secondary | ICD-10-CM | POA: Diagnosis not present

## 2017-01-09 DIAGNOSIS — M6281 Muscle weakness (generalized): Secondary | ICD-10-CM | POA: Diagnosis not present

## 2017-01-09 DIAGNOSIS — R293 Abnormal posture: Secondary | ICD-10-CM | POA: Diagnosis not present

## 2017-01-09 DIAGNOSIS — R2681 Unsteadiness on feet: Secondary | ICD-10-CM | POA: Diagnosis not present

## 2017-01-09 DIAGNOSIS — R41841 Cognitive communication deficit: Secondary | ICD-10-CM | POA: Diagnosis not present

## 2017-01-09 DIAGNOSIS — G809 Cerebral palsy, unspecified: Secondary | ICD-10-CM | POA: Diagnosis not present

## 2017-01-09 DIAGNOSIS — R278 Other lack of coordination: Secondary | ICD-10-CM | POA: Diagnosis not present

## 2017-01-10 DIAGNOSIS — R41841 Cognitive communication deficit: Secondary | ICD-10-CM | POA: Diagnosis not present

## 2017-01-10 DIAGNOSIS — M6281 Muscle weakness (generalized): Secondary | ICD-10-CM | POA: Diagnosis not present

## 2017-01-10 DIAGNOSIS — R278 Other lack of coordination: Secondary | ICD-10-CM | POA: Diagnosis not present

## 2017-01-10 DIAGNOSIS — R293 Abnormal posture: Secondary | ICD-10-CM | POA: Diagnosis not present

## 2017-01-10 DIAGNOSIS — R2681 Unsteadiness on feet: Secondary | ICD-10-CM | POA: Diagnosis not present

## 2017-01-10 DIAGNOSIS — G809 Cerebral palsy, unspecified: Secondary | ICD-10-CM | POA: Diagnosis not present

## 2017-01-13 ENCOUNTER — Telehealth: Payer: Self-pay | Admitting: Physician Assistant

## 2017-01-13 DIAGNOSIS — R278 Other lack of coordination: Secondary | ICD-10-CM | POA: Diagnosis not present

## 2017-01-13 DIAGNOSIS — R41841 Cognitive communication deficit: Secondary | ICD-10-CM | POA: Diagnosis not present

## 2017-01-13 DIAGNOSIS — M6281 Muscle weakness (generalized): Secondary | ICD-10-CM | POA: Diagnosis not present

## 2017-01-13 DIAGNOSIS — R293 Abnormal posture: Secondary | ICD-10-CM | POA: Diagnosis not present

## 2017-01-13 DIAGNOSIS — G809 Cerebral palsy, unspecified: Secondary | ICD-10-CM | POA: Diagnosis not present

## 2017-01-13 DIAGNOSIS — R2681 Unsteadiness on feet: Secondary | ICD-10-CM | POA: Diagnosis not present

## 2017-01-13 NOTE — Telephone Encounter (Signed)
Patient is notified. [facility]

## 2017-01-13 NOTE — Telephone Encounter (Signed)
Please advise. Should she try taking 1 Senokot?

## 2017-01-13 NOTE — Telephone Encounter (Signed)
Stop senekot, and decrease Miralax to one dose daily - wait a few days if still  Having diarrhea hold the Miralax for 3-4 days.[name] Kitchen..[name] Kitchen

## 2017-01-14 DIAGNOSIS — R2681 Unsteadiness on feet: Secondary | ICD-10-CM | POA: Diagnosis not present

## 2017-01-14 DIAGNOSIS — R293 Abnormal posture: Secondary | ICD-10-CM | POA: Diagnosis not present

## 2017-01-14 DIAGNOSIS — M6281 Muscle weakness (generalized): Secondary | ICD-10-CM | POA: Diagnosis not present

## 2017-01-14 DIAGNOSIS — R278 Other lack of coordination: Secondary | ICD-10-CM | POA: Diagnosis not present

## 2017-01-14 DIAGNOSIS — G809 Cerebral palsy, unspecified: Secondary | ICD-10-CM | POA: Diagnosis not present

## 2017-01-14 DIAGNOSIS — R41841 Cognitive communication deficit: Secondary | ICD-10-CM | POA: Diagnosis not present

## 2017-01-15 DIAGNOSIS — M6281 Muscle weakness (generalized): Secondary | ICD-10-CM | POA: Diagnosis not present

## 2017-01-15 DIAGNOSIS — R278 Other lack of coordination: Secondary | ICD-10-CM | POA: Diagnosis not present

## 2017-01-15 DIAGNOSIS — R2681 Unsteadiness on feet: Secondary | ICD-10-CM | POA: Diagnosis not present

## 2017-01-15 DIAGNOSIS — R41841 Cognitive communication deficit: Secondary | ICD-10-CM | POA: Diagnosis not present

## 2017-01-15 DIAGNOSIS — G809 Cerebral palsy, unspecified: Secondary | ICD-10-CM | POA: Diagnosis not present

## 2017-01-15 DIAGNOSIS — R293 Abnormal posture: Secondary | ICD-10-CM | POA: Diagnosis not present

## 2017-01-16 DIAGNOSIS — R2681 Unsteadiness on feet: Secondary | ICD-10-CM | POA: Diagnosis not present

## 2017-01-16 DIAGNOSIS — G809 Cerebral palsy, unspecified: Secondary | ICD-10-CM | POA: Diagnosis not present

## 2017-01-16 DIAGNOSIS — R41841 Cognitive communication deficit: Secondary | ICD-10-CM | POA: Diagnosis not present

## 2017-01-16 DIAGNOSIS — R278 Other lack of coordination: Secondary | ICD-10-CM | POA: Diagnosis not present

## 2017-01-16 DIAGNOSIS — M6281 Muscle weakness (generalized): Secondary | ICD-10-CM | POA: Diagnosis not present

## 2017-01-16 DIAGNOSIS — R293 Abnormal posture: Secondary | ICD-10-CM | POA: Diagnosis not present

## 2017-01-17 DIAGNOSIS — R278 Other lack of coordination: Secondary | ICD-10-CM | POA: Diagnosis not present

## 2017-01-17 DIAGNOSIS — G809 Cerebral palsy, unspecified: Secondary | ICD-10-CM | POA: Diagnosis not present

## 2017-01-17 DIAGNOSIS — R293 Abnormal posture: Secondary | ICD-10-CM | POA: Diagnosis not present

## 2017-01-17 DIAGNOSIS — M6281 Muscle weakness (generalized): Secondary | ICD-10-CM | POA: Diagnosis not present

## 2017-01-17 DIAGNOSIS — R2681 Unsteadiness on feet: Secondary | ICD-10-CM | POA: Diagnosis not present

## 2017-01-17 DIAGNOSIS — R41841 Cognitive communication deficit: Secondary | ICD-10-CM | POA: Diagnosis not present

## 2017-01-20 DIAGNOSIS — R278 Other lack of coordination: Secondary | ICD-10-CM | POA: Diagnosis not present

## 2017-01-20 DIAGNOSIS — R293 Abnormal posture: Secondary | ICD-10-CM | POA: Diagnosis not present

## 2017-01-20 DIAGNOSIS — M6281 Muscle weakness (generalized): Secondary | ICD-10-CM | POA: Diagnosis not present

## 2017-01-20 DIAGNOSIS — G809 Cerebral palsy, unspecified: Secondary | ICD-10-CM | POA: Diagnosis not present

## 2017-01-20 DIAGNOSIS — R41841 Cognitive communication deficit: Secondary | ICD-10-CM | POA: Diagnosis not present

## 2017-01-20 DIAGNOSIS — R2681 Unsteadiness on feet: Secondary | ICD-10-CM | POA: Diagnosis not present

## 2017-01-21 DIAGNOSIS — M6281 Muscle weakness (generalized): Secondary | ICD-10-CM | POA: Diagnosis not present

## 2017-01-21 DIAGNOSIS — R2681 Unsteadiness on feet: Secondary | ICD-10-CM | POA: Diagnosis not present

## 2017-01-21 DIAGNOSIS — R293 Abnormal posture: Secondary | ICD-10-CM | POA: Diagnosis not present

## 2017-01-21 DIAGNOSIS — G809 Cerebral palsy, unspecified: Secondary | ICD-10-CM | POA: Diagnosis not present

## 2017-01-21 DIAGNOSIS — R278 Other lack of coordination: Secondary | ICD-10-CM | POA: Diagnosis not present

## 2017-01-21 DIAGNOSIS — R41841 Cognitive communication deficit: Secondary | ICD-10-CM | POA: Diagnosis not present

## 2017-01-22 ENCOUNTER — Telehealth: Payer: Self-pay | Admitting: Physician Assistant

## 2017-01-22 DIAGNOSIS — R41841 Cognitive communication deficit: Secondary | ICD-10-CM | POA: Diagnosis not present

## 2017-01-22 DIAGNOSIS — G809 Cerebral palsy, unspecified: Secondary | ICD-10-CM | POA: Diagnosis not present

## 2017-01-22 DIAGNOSIS — R2681 Unsteadiness on feet: Secondary | ICD-10-CM | POA: Diagnosis not present

## 2017-01-22 DIAGNOSIS — R278 Other lack of coordination: Secondary | ICD-10-CM | POA: Diagnosis not present

## 2017-01-22 DIAGNOSIS — M6281 Muscle weakness (generalized): Secondary | ICD-10-CM | POA: Diagnosis not present

## 2017-01-22 DIAGNOSIS — R293 Abnormal posture: Secondary | ICD-10-CM | POA: Diagnosis not present

## 2017-01-23 DIAGNOSIS — R293 Abnormal posture: Secondary | ICD-10-CM | POA: Diagnosis not present

## 2017-01-23 DIAGNOSIS — R41841 Cognitive communication deficit: Secondary | ICD-10-CM | POA: Diagnosis not present

## 2017-01-23 DIAGNOSIS — M6281 Muscle weakness (generalized): Secondary | ICD-10-CM | POA: Diagnosis not present

## 2017-01-23 DIAGNOSIS — R2681 Unsteadiness on feet: Secondary | ICD-10-CM | POA: Diagnosis not present

## 2017-01-23 DIAGNOSIS — G809 Cerebral palsy, unspecified: Secondary | ICD-10-CM | POA: Diagnosis not present

## 2017-01-23 DIAGNOSIS — R278 Other lack of coordination: Secondary | ICD-10-CM | POA: Diagnosis not present

## 2017-01-23 NOTE — Telephone Encounter (Signed)
Speak to nurse please

## 2017-01-23 NOTE — Telephone Encounter (Signed)
Left message to call back  

## 2017-01-24 DIAGNOSIS — M6281 Muscle weakness (generalized): Secondary | ICD-10-CM | POA: Diagnosis not present

## 2017-01-24 DIAGNOSIS — R2681 Unsteadiness on feet: Secondary | ICD-10-CM | POA: Diagnosis not present

## 2017-01-24 DIAGNOSIS — R278 Other lack of coordination: Secondary | ICD-10-CM | POA: Diagnosis not present

## 2017-01-24 DIAGNOSIS — R293 Abnormal posture: Secondary | ICD-10-CM | POA: Diagnosis not present

## 2017-01-24 DIAGNOSIS — R41841 Cognitive communication deficit: Secondary | ICD-10-CM | POA: Diagnosis not present

## 2017-01-24 DIAGNOSIS — G809 Cerebral palsy, unspecified: Secondary | ICD-10-CM | POA: Diagnosis not present

## 2017-01-24 NOTE — Telephone Encounter (Signed)
No..she gets frustrated with any diarrhea or mild constipation - She has CP ,is wheelchair bound , and she is trying to avoid using dulcolax supp which work well for her - we will just keep adjusting as she needs .[name] Kitchen..[name] Kitchen

## 2017-01-24 NOTE — Telephone Encounter (Signed)
Difficult to discuss issues with patient over the phone. She asks I call her nurse. [facility]. [facility]. Transferred to nurses station. Cannot get an answer there.

## 2017-01-24 NOTE — Telephone Encounter (Signed)
Have encouraged the patient to adjust her miralax and senna to what her bowel movements need. Amitiza should be daily . Use Miralax daily unless she is having diarrhea. Senna can be used PRN also.  Is there anything she should do differently?

## 2017-02-07 DIAGNOSIS — M6240 Contracture of muscle, unspecified site: Secondary | ICD-10-CM | POA: Diagnosis not present

## 2017-02-07 DIAGNOSIS — G808 Other cerebral palsy: Secondary | ICD-10-CM | POA: Diagnosis not present

## 2017-02-07 DIAGNOSIS — M542 Cervicalgia: Secondary | ICD-10-CM | POA: Diagnosis not present

## 2017-02-08 ENCOUNTER — Emergency Department (HOSPITAL_COMMUNITY)
Admission: EM | Admit: 2017-02-08 | Discharge: 2017-02-09 | Disposition: A | Discharge status: Home / Self Care | Payer: Medicare Other | Attending: Emergency Medicine | Admitting: Emergency Medicine

## 2017-02-08 ENCOUNTER — Encounter (HOSPITAL_COMMUNITY): Payer: Self-pay | Admitting: Emergency Medicine

## 2017-02-08 DIAGNOSIS — N390 Urinary tract infection, site not specified: Secondary | ICD-10-CM

## 2017-02-08 DIAGNOSIS — N3289 Other specified disorders of bladder: Secondary | ICD-10-CM | POA: Diagnosis not present

## 2017-02-08 DIAGNOSIS — Y829 Unspecified medical devices associated with adverse incidents: Secondary | ICD-10-CM | POA: Insufficient documentation

## 2017-02-08 DIAGNOSIS — R1031 Right lower quadrant pain: Secondary | ICD-10-CM | POA: Diagnosis not present

## 2017-02-08 DIAGNOSIS — Z7901 Long term (current) use of anticoagulants: Secondary | ICD-10-CM | POA: Diagnosis not present

## 2017-02-08 DIAGNOSIS — T83098A Other mechanical complication of other indwelling urethral catheter, initial encounter: Secondary | ICD-10-CM | POA: Diagnosis not present

## 2017-02-08 DIAGNOSIS — I10 Essential (primary) hypertension: Secondary | ICD-10-CM | POA: Diagnosis not present

## 2017-02-08 DIAGNOSIS — T83090A Other mechanical complication of cystostomy catheter, initial encounter: Secondary | ICD-10-CM

## 2017-02-08 LAB — URINALYSIS, ROUTINE W REFLEX MICROSCOPIC
Bilirubin Urine: NEGATIVE
Glucose, UA: NEGATIVE mg/dL
KETONES UR: NEGATIVE mg/dL
Nitrite: POSITIVE — AB
PROTEIN: 100 mg/dL — AB
Specific Gravity, Urine: 1.013 (ref 1.005–1.030)
pH: 5 (ref 5.0–8.0)

## 2017-02-08 MED ORDER — NYSTATIN 100000 UNIT/GM EX POWD
Freq: Two times a day (BID) | CUTANEOUS | Status: DC
  Administered 2017-02-08: via TOPICAL
  Filled 2017-02-08: qty 15

## 2017-02-08 MED ORDER — BACLOFEN 20 MG PO TABS
20.0000 mg | ORAL_TABLET | Freq: Once | ORAL | Status: AC
  Administered 2017-02-08: 20 mg via ORAL
  Filled 2017-02-08: qty 1

## 2017-02-08 MED ORDER — OXYBUTYNIN CHLORIDE 5 MG PO TABS
5.0000 mg | ORAL_TABLET | Freq: Once | ORAL | Status: AC
  Administered 2017-02-08: 5 mg via ORAL
  Filled 2017-02-08: qty 1

## 2017-02-08 MED ORDER — CEPHALEXIN 500 MG PO CAPS: 500.0000 mg | ORAL_CAPSULE | Freq: Two times a day (BID) | ORAL | 0 refills | Status: DC

## 2017-02-08 MED ORDER — CEFTRIAXONE SODIUM 1 G IJ SOLR
1.0000 g | Freq: Once | INTRAMUSCULAR | Status: AC
  Administered 2017-02-08: 1 g via INTRAMUSCULAR
  Filled 2017-02-08: qty 10

## 2017-02-08 MED ORDER — LIDOCAINE HCL (PF) 1 % IJ SOLN
INTRAMUSCULAR | Status: AC
  Administered 2017-02-08: 5 mL
  Filled 2017-02-08: qty 5

## 2017-02-08 MED ORDER — TIZANIDINE HCL 4 MG PO TABS
4.0000 mg | ORAL_TABLET | Freq: Once | ORAL | Status: AC
  Administered 2017-02-08: 4 mg via ORAL
  Filled 2017-02-08: qty 1

## 2017-02-08 MED ORDER — OXYCODONE HCL 5 MG PO TABS
5.0000 mg | ORAL_TABLET | Freq: Once | ORAL | Status: AC
  Administered 2017-02-08: 5 mg via ORAL
  Filled 2017-02-08: qty 1

## 2017-02-08 NOTE — Discharge Instructions (Signed)
[name] [name] had an evening dose of BACLOFEN, ZANAFLEX, OXYCODONE, and DITROPAN as well as a dose of CEFTRIAXONE to treat a urinary tract infection. Please [facility] her prescription [facility] on 02/09/17. PLEASE GIVE HER ALL OF HER OTHER NIGHTTIME MEDICATIONS THIS EVENING (02/08/17).

## 2017-02-08 NOTE — ED Provider Notes (Addendum)
[facility]-EMERGENCY DEPT Provider Note   CSN: [id number] Arrival date & time: 02/08/17 1808     History    Chief Complaint  Patient presents with  . Abdominal Pain     HPI [name] [name] is a 41 y.o. female.  41yo F w/ extensive PMH below including cerebral palsy, suprapubic catheter, intrathecal baclofen pump who p/w bladder spasms. Patient states that she routinely has bladder spasms frequently but her spasms increased this morning and have been very painful. She has not had her catheter changed since January and she normally has it changed once a month. She has noticed a decrease in her urine output yesterday and today but denies any change in the color or odor of her urine. No fevers or vomiting. She has been taking her baclofen and ditropan without relief.  Past Medical History:  Diagnosis Date  . Abdominal pain 01/02/2012   Overview:  Overview:  Per Previous pcp note- Dr. Ta--Pt has history of endometriosis and had DUB in 2011.  She She had u/s that showed normal endometrial stripe.  She had endometrial bx that was wnl.  She has a lot of abd cramping every month.  This cramping was sometimes not relieved with hydrocodone.  She was referred to [facility] for IUD placement to help endometriosis and abd cramping.  [name]  . Abdominal pain 01/02/2012   Overview:  Overview:  Per Previous pcp note- Dr. Ta--Pt has history of endometriosis and had DUB in 2011.  She She had u/s that showed normal endometrial stripe.  She had endometrial bx that was wnl.  She has a lot of abd cramping every month.  This cramping was sometimes not relieved with hydrocodone.  She was referred to [facility] for IUD placement to help endometriosis and abd cramping.  [name] was placed 04/30/11 by [facility].  Pt still complains of abd cramping, which I am treating with hydrocodone and ultram. Pt reports some initial relief of pain from IUD, but states it has now returned back to similar to previous cramping. Gave patient a  shot of Toradol 60 mg IM today-since positive exacerbation of abdominal cramping during past 2 weeks. Physical exam reassuring. Patient may need to return to [facility] for further workup since she is plugged in with the GYN providers there for further treatment options regarding endometriosis and abdominal cramping. The dysfunctional uterine bleeding now well contr  . Acute cystitis with hematuria   . Acute cystitis without hematuria   . Acute respiratory failure with hypoxia ([name])   . Anemia   . Cellulitis 12/22/2014  . Cerebral palsy ([name])    Spastic Cerebral palsy, mentally intact  . Contracture, joint, multiple sites    Electric wheelchair, uses left hand to operate chair.   . Depression   . Disease of female genital organs 10/24/2010   Overview:  Overview:  Pt has history of endometriosis and had DUB in 2011.  She had u/s that showed normal endometrial stripe.  She had endometrial bx that was wnl.  She has a lot of abd cramping every month.  This cramping was sometimes not relieved with hydrocodone.  She was referred to [facility] for IUD placement to help endometriosis and abd cramping.  [name] was placed 04/30/11 by [facility].  Pt still complains of abd cramping, which was treated with hydrocodone and ultram.   Last seen at [facility] by OB/GYN 12/24/11, given Depo Lupron injection.  Ordering CT scan to r/o additional cause of abdominal pain (pt with known endometriosis  and will not tolerate vaginal U/S, pain not improved s/p [name] or with Lupron injection-- only caused hot flashes and amenorrhea, no pain relief).  Also made referral to pain clinic for better pain control.  Do not think hysterectomy would help with pain, and concern for contamination of baclofen pump if did proceed with surgery.    8/5/13[name]: Pt was seen by   . [name]   . Dysarthria   . Dyspnea and respiratory abnormality 02/04/2011   Overview:  Overview:  Pt has history of recurrent PE and is  on chronic coumadin.  Pt has been complaining of dyspnea that she describes as chest tightness.  She has been to the ER multiple times for this.  Usually she gets CTA and serial CE.  She has had at least 6 CTA in a period of since 2011.  She also has an O2 requirement. It is unclear why pt feels dyspneic, complains of chest tightness, or has O2 requirement.  I thought that her complaint may be from deconditioning and referred her to PT.  Unfortunately Medicaid only pays for 4 PT visits.  Pt should be doing these exercises at home, but she has not.  I have referred her to [facility], and she has been seen by [name].  He did not understand the etiology of her dyspnea and O2 requirement.  He will continue to follow her.   Last Assessment & Plan:  Pt presents with concerns for chest tightness and dyspnea.  She was seen in ED yesterday and discharged home.  While there she had a CT chest 04/20/11 showed 1.  Multifocal degradation as detailed above.  N  . Endometriosis   . GERD (gastroesophageal reflux disease)   . Gram positive sepsis ([name]) 10/24/2014  . Gross hematuria 03/12/2013   Overview:  Overview:  status post replacement of suprapubic tube 03/04/2013  Last Assessment & Plan:  Recent urine culture collected in ED on 5/4 did not result in significant growth. Moreover, patient has been asymptomatic with normal WBC, no fever and no true symptoms making symptomatic UTI less likely.  Will continue to monitor for any sign of infection.  Patient's xarelto had been held during her recent hospitalization due to hematuria. This has since been re-started. Monitor bleeding.    [name] Kitchen HCAP (healthcare-associated pneumonia) 10/25/2014  . History of anticoagulant therapy 10/21/2012  . History of endometriosis 10/2012   OBGYN [facility]: Laparoscopic Endometrial Ablation, TVH,  . History of recurrent UTIs   . Hypertension   . HYPERTENSION, BENIGN 01/31/2010   Qualifier: Diagnosis of  By: [name], MD, [name] Incontinent  of feces 03/11/2016  . Infantile cerebral palsy ([name]) 01/29/2007   Overview:  She has spastic CP.  She is mentally intact.  She is wheelchair bound.  She is wheelchair bound for ambulation      . Intracervical pessary 05/07/2011   Overview:  Overview:  Placed by [facility] GYN 04/30/11 to treat DUB and Endometriosis.  She is seen at GYN clinic at [facility] but was considered a poor surgical candidate and referred her to [facility].  For DUB she had pelvic ultrasound that showed thin stripe and she had endometrial Bx by Dr Jennette Kettle in GYN clinic, which was negative.     . Macroscopic hematuria 03/10/2013   status post replacement of suprapubic tube 03/04/2013   . Migraine   . OCD (obsessive compulsive disorder)   . Parapneumonic effusion 10/25/2014  . Presence  of intrathecal baclofen pump 03/07/2014   R LQ. Insertion T10.    [name] Kitchen Psychiatric illness 10/31/2014  . Pulmonary embolism ([name])    Lifetime Coumadin  . Pulmonary embolism ([name]) 2011, 01/2011   Will be on lifetime coumadin  . Recurrent pulmonary embolism ([name]) 05/03/2011   Pt has history of recurrent PE and is on chronic coumadin.  Pt has been complaining of dyspnea that she describes as chest tightness.  She has been to the ER multiple times for this.  Usually she gets CTA and serial CE.  She has had at least 6 CTA in a period of since 2011.  She also has an O2 requirement. It is unclear why pt feels dyspneic, complains of chest tightness, or has O2 requirement.  I thought that her complaint may be from deconditioning and referred her to PT.  Unfortunately Medicaid only pays for 4 PT visits.  Pt should be doing these exercises at home, but she has not.  I have referred her to [facility], and she has been seen by [name].  He did not understand the etiology of her dyspnea and O2 requirement.  He will continue to follow her.     . Seborrheic keratosis 01/18/2011  . Seizures ([name]) 02/12/2016  . Spasticity 01/05/2014  . Transient alteration of awareness, recurrent  09/08/2006   Recurrent episodes where [place]  loses contact with her world and that have been studied thoroughly and appear nonepileptic in nature per Dr Terrace Arabia (neurology) The patient has had episodes starting in 2007 that initially began 1-4 times a day during which time she would have periods of unawareness followed by confusion. This continuous video EEG monitoring performed from October 8-12, 2007. Had   . Urethra dilated and patulous 07/03/2015   Patulous, Dilated Urethra. 5mL balloon on a 22-Fr catheter hoping this would prevent the bladder spasm from pushing the balloon down the urethra (Dr Logan Bores, Urology [facility], July 2016)      Patient Active Problem List   Diagnosis Date Noted  . Incontinent of feces 03/11/2016  . Chronic anticoagulation 03/11/2016  . Involuntary movements 02/22/2016  . Seizures ([name]) 02/12/2016  . Dyspnea 02/12/2016  . Cerebral palsy, quadriplegic ([name]) 02/06/2016  . Swelling of extremity 01/13/2016  . Pressure ulcer 10/09/2015  . Status post insertion of inferior vena caval filter 09/28/2015  . Absence of bladder continence 08/25/2015  . Person living in residential institution 06/14/2015  . Pulmonary hypertension   . Anemia of chronic disease   . Spasticity 03/15/2015  . Constipation, chronic 10/10/2014  . Athetoid cerebral palsy ([name]) 04/13/2014  . Dystonia 04/12/2014  . Presence of intrathecal baclofen pump 03/07/2014  . Dental caries 07/18/2013  . Multiple thyroid nodules 05/20/2013  . Osteoarthrosis 04/07/2013  . [name]   . Dysarthria   . GERD (gastroesophageal reflux disease)   . Neurogenic bladder 02/05/2013  . Anxiety state 10/21/2012  . Chronic pain syndrome 06/17/2012  . Recurrent pulmonary embolism ([name]) 05/03/2011  . Pulmonary embolism with infarction ([name]) 05/03/2011  . Chronic suprapubic catheter ([name]) 03/15/2011  . Disease of female genital organs 10/24/2010  . HYPERTENSION, BENIGN 01/31/2010  . CP (cerebral  palsy), spastic ([name]) 12/27/2008  . Contracted, joint, multiple sites 12/27/2008  . Major depressive disorder, recurrent episode ([name]) 01/29/2007  . Obsessive Compulsive Disorder 01/29/2007  . Transient alteration of awareness, recurrent 09/08/2006    Past Surgical History:  Procedure Laterality Date  . ABDOMINAL HYSTERECTOMY    . APPENDECTOMY    .  BACLOFEN PUMP REFILL     x 3 times  . CARPAL TUNNEL RELEASE  08/2008   Dr Teressa Senter  . CESAREAN SECTION     x 2  . CHOLECYSTECTOMY  10/14/2015   Procedure: LAPAROSCOPIC CHOLECYSTECTOMY;  Surgeon: Violeta Gelinas, MD;  Location: [facility] OR;  Service: General;;  . COLPOSCOPY  06/2000  . INTRAUTERINE DEVICE INSERTION  04/30/11   Inserted by [facility] GYN for endometriosis  . laparoscopically assisted Vag Hysterectomy  10/27/2012  . MULTIPLE EXTRACTIONS WITH ALVEOLOPLASTY Bilateral 07/19/2013   Procedure: EXTRACTIONS #4, 2,95,62,13;  Surgeon: Francene Finders, DDS;  Location: [facility] OR;  Service: Oral Surgery;  Laterality: Bilateral;  . NEUROMA SURGERY Left    anterior and posterior intersosseous  . PAIN PUMP IMPLANTATION N/A 03/07/2014   Procedure: baclofen pump revision/replacement and Catheter connection replacement;  Surgeon: Cristi Loron, MD;  Location: [facility] OR[facility];  Service: Neurosurgery;  Laterality: N/A;  baclofen pump revision/replacement and Catheter connection replacement  . PROGRAMABLE BACLOFEN PUMP REVISION  03/17/14   Battery Replacement   . TUBAL LIGATION  2003  . URETHRA SURGERY    . WRIST SURGERY  06/2010   Dr Dierdre Searles, hand surgeon, [name]    OB History    No data available        Home Medications    Prior to Admission medications   Medication Sig Start Date End Date Taking? Authorizing Provider  acetaminophen (TYLENOL) 325 MG tablet Take 650 mg by mouth. Every 6 hours as needed    Historical Provider, MD  amantadine (SYMMETREL) 100 MG capsule Take 100 mg by mouth. Take one tablet daily    Historical Provider, MD    antiseptic oral rinse (BIOTENE) LIQD 15 mLs by Mouth Rinse route as needed for dry mouth.    Historical Provider, MD  baclofen (LIORESAL) 20 MG tablet Take 20 mg by mouth 4 (four) times daily. For bladder spasms    Historical Provider, MD  bisacodyl (DULCOLAX) 10 MG suppository Place 10 mg rectally as needed for moderate constipation.    Historical Provider, MD  cephALEXin (KEFLEX) 500 MG capsule Take 1 capsule (500 mg total) by mouth 2 (two) times daily. 02/08/17   Laurence Spates, MD  clonazePAM (KLONOPIN) 0.5 MG tablet Take 0.5 mg by mouth. Take one three times daily    Historical Provider, MD  feeding supplement, ENSURE ENLIVE, (ENSURE ENLIVE) LIQD Take 237 mLs by mouth 2 (two) times daily between meals. 10/30/15   Palma Holter, MD  FLUoxetine (PROZAC) 20 MG tablet Take 20 mg by mouth daily.    Historical Provider, MD  ibuprofen (ADVIL,MOTRIN) 200 MG tablet Take 200 mg by mouth. Take 2 tablets every 8 hours as needed    Historical Provider, MD  lamoTRIgine (LAMICTAL) 100 MG tablet Take 100 mg by mouth at bedtime.    Historical Provider, MD  lubiprostone (AMITIZA) 24 MCG capsule Take 1 capsule (24 mcg total) by mouth 2 (two) times daily with a meal. 01/01/17   [name] S Esterwood, PA-C  Melatonin 5 MG TABS Take by mouth. Take one tablet at bedtime    Historical Provider, MD  Multiple Vitamins-Minerals (CERTA-VITE PO) Take 1 tablet by mouth daily.    Historical Provider, MD  oxybutynin (DITROPAN) 5 MG tablet Take 5 mg by mouth. Take one tablet four times a day 02/20/16   Historical Provider, MD  oxyCODONE (ROXICODONE) 5 MG immediate release tablet Take one tablet by mouth every 4 hours as needed for pain 07/10/16  Kimber Relic, MD  polyethylene glycol powder (GLYCOLAX/MIRALAX) powder Take 1  1/2 dose  daily in 12 ounces of fluid every day. 01/01/17   [name] S Esterwood, PA-C  QUEtiapine (SEROQUEL) 100 MG tablet Take 1 tablet (100 mg total) by mouth at bedtime. 06/21/15   Tyrone Nine, MD  senna  (SENOKOT) 8.6 MG TABS tablet 2 nightly to help constipation 04/01/16   Kimber Relic, MD  sodium phosphate (FLEET) 7-19 GM/118ML ENEM Place 133 mLs (1 enema total) rectally 2 (two) times a week. 01/02/17   [name] S Esterwood, PA-C  tiZANidine (ZANAFLEX) 4 MG capsule Take 4 mg by mouth 2 (two) times daily at 8 am and 10 pm.    Historical Provider, MD  warfarin (COUMADIN) 7.5 MG tablet 8.5mg  daily to anticoagulate blood to prevent pulmonary emboli. May give as 7.5 mg plus 1 mg tablet daily. 04/01/16   Kimber Relic, MD      Family History  Problem Relation Age of Onset  . Asthma Father   . Colon cancer Maternal Grandmother     Died in her 15's  . Breast cancer Paternal Grandmother     Died in her 32's  . Heart attack Maternal Grandfather     Died in his 28's  . Alzheimer's disease Paternal Grandfather     Died in his 13's  . Stomach cancer Neg Hx      Social History  Substance Use Topics  . Smoking status: Never Smoker  . Smokeless tobacco: Never Used  . Alcohol use 0.6 oz/week    1 Glasses of wine per week     Comment: Drinks alcohol once a month.     Allergies     Morphine    Review of Systems  10 Systems reviewed and are negative for acute change except as noted in the HPI.   Physical Exam Updated Vital Signs BP 134/83   Pulse 82   Temp 98.1 F (36.7 C) (Oral)   Resp 16   LMP 04/05/2011   SpO2 99%   Physical Exam  Constitutional: She is oriented to person, place, and time. No distress.  Thin, chronically ill appearing, awake and alert, talking on cell phone  HENT:  Head: Normocephalic and atraumatic.  Dry lips  Eyes: Conjunctivae are normal.  Neck:  Neck contracture to R  Cardiovascular: Normal rate, regular rhythm and normal heart sounds.   No murmur heard. Pulmonary/Chest: Effort normal and breath sounds normal.  Abdominal: Soft. Bowel sounds are normal. She exhibits no distension. There is no tenderness.  Suprapubic catheter in place w/ some urine  leaking around insertion site  Musculoskeletal: She exhibits no edema.  Contractures b/l arms  Neurological: She is alert and oriented to person, place, and time.  Increased muscle tone w/ contractures  Skin: Skin is warm and dry.  mild erythema of lower abdominal skin fold  Psychiatric:  Calm, cooperative  Nursing note and vitals reviewed.     ED Treatments / Results  Labs (all labs ordered are listed, but only abnormal results are displayed) Labs Reviewed  URINALYSIS, ROUTINE W REFLEX MICROSCOPIC - Abnormal; Notable for the following:       Result Value   APPearance TURBID (*)    Hgb urine dipstick MODERATE (*)    Protein, ur 100 (*)    Nitrite POSITIVE (*)    Leukocytes, UA MODERATE (*)    Bacteria, UA MANY (*)    Squamous Epithelial / LPF 6-30 (*)  Non Squamous Epithelial 0-5 (*)    All other components within normal limits  URINE CULTURE     EKG  EKG Interpretation  Date/Time:    Ventricular Rate:    PR Interval:    QRS Duration:   QT Interval:    QTC Calculation:   R Axis:     Text Interpretation:           Radiology No results found.  Procedures Procedures (including critical care time) BLADDER CATHETERIZATION Date/Time: 02/09/2017 12:10 AM Performed by: Laurence SpatesLITTLE, RACHEL MORGAN Authorized by: Laurence SpatesLITTLE, RACHEL MORGAN   Consent:    Consent obtained:  Verbal   Consent given by:  Patient Pre-procedure details:    Procedure purpose:  Therapeutic   Preparation: Patient was prepped and draped in usual sterile fashion   Anesthesia (see MAR for exact dosages):    Anesthesia method:  None Procedure details:    Provider performed due to:  Complicated insertion (suprapubic catheter)   Catheter type:  Foley   Catheter size:  20 Fr   Bladder irrigation: no     Number of attempts:  1   Urine characteristics:  Mildly cloudy Post-procedure details:    Patient tolerance of procedure:  Tolerated well, no immediate complications    Medications Ordered  in ED  Medications  nystatin (MYCOSTATIN/NYSTOP) topical powder ( Topical Given 02/08/17 2333)  baclofen (LIORESAL) tablet 20 mg (20 mg Oral Given 02/08/17 2015)  tiZANidine (ZANAFLEX) tablet 4 mg (4 mg Oral Given 02/08/17 2158)  oxyCODONE (Oxy IR/ROXICODONE) immediate release tablet 5 mg (5 mg Oral Given 02/08/17 2152)  oxybutynin (DITROPAN) tablet 5 mg (5 mg Oral Given 02/08/17 2158)  cefTRIAXone (ROCEPHIN) injection 1 g (1 g Intramuscular Given 02/08/17 2332)  lidocaine (PF) (XYLOCAINE) 1 % injection (5 mLs  Given 02/08/17 2333)  lidocaine (PF) (XYLOCAINE) 1 % injection (5 mLs  Given 02/08/17 2344)     Initial Impression / Assessment and Plan / ED Course  I have reviewed the triage vital signs and the nursing notes.  Pertinent labs that were available during my care of the patient were reviewed by me and considered in my medical decision making (see chart for details).    Pt w/ chronic suprapubic catheter and bladder spasms p/w Increased bladder spasms today and decreased urine output yesterday and today. She was uncomfortable on exam but nontoxic and in no acute distress. Vital signs stable. She had some urine in Foley bag. Removed old catheter since it had not been changed in 2 months; immediately had significant output of several hundred cc of urine. It appears that old foley was partially obstructed. Replaced with 54F w/ 30ml saline in bulb per instructions on catheter. New catheter replaced without problems and sent for UA, which suggests infection when compared to previous samples as it is nitrite positive with many bacteria and WBC. PT had immediate relief of bladder spasms and on re-examination stated her spasms had stopped. Gave IM CTX and keflex for UTI. He should have follow-up with her urologist, Dr. Logan BoresEvans, in 2 days. Given she is afebrile, tolerating PO and has normal VS, I feel she is safe for outpatient management. Patient discharged back to nursing facility in satisfactory  condition.  Final Clinical Impressions(s) / ED Diagnoses   Final diagnoses:  Obstruction of suprapubic catheter, initial encounter ([name])  Urinary tract infection without hematuria, site unspecified  Bladder spasms     New Prescriptions   CEPHALEXIN (KEFLEX) 500 MG CAPSULE    Take 1 capsule (  500 mg total) by mouth 2 (two) times daily.       Laurence Spates, MD 02/09/17 0009    Laurence Spates, MD 02/09/17 [phone]

## 2017-02-08 NOTE — ED Triage Notes (Signed)
PER EMS Pt. Stated that she is having bladder spasms and needed to come to the hospital. [facility]. 103/71, hr 84, rr 10 96% Ra, CBG 115

## 2017-02-09 DIAGNOSIS — R100 Acute abdomen: Secondary | ICD-10-CM | POA: Diagnosis not present

## 2017-02-09 DIAGNOSIS — N139 Obstructive and reflux uropathy, unspecified: Secondary | ICD-10-CM | POA: Diagnosis not present

## 2017-02-10 LAB — URINE CULTURE: SPECIAL REQUESTS: NORMAL

## 2017-02-11 DIAGNOSIS — N319 Neuromuscular dysfunction of bladder, unspecified: Secondary | ICD-10-CM | POA: Diagnosis not present

## 2017-02-11 DIAGNOSIS — G800 Spastic quadriplegic cerebral palsy: Secondary | ICD-10-CM | POA: Diagnosis not present

## 2017-02-11 DIAGNOSIS — Z9350 Unspecified cystostomy status: Secondary | ICD-10-CM | POA: Diagnosis not present

## 2017-02-11 DIAGNOSIS — G894 Chronic pain syndrome: Secondary | ICD-10-CM | POA: Diagnosis not present

## 2017-02-13 ENCOUNTER — Telehealth (INDEPENDENT_AMBULATORY_CARE_PROVIDER_SITE_OTHER): Payer: Self-pay | Admitting: Family

## 2017-02-13 NOTE — Telephone Encounter (Signed)
Thank you, I agree with this plan.  This helped her last time.  I also agree with your assessment and advice about the blackout spells.

## 2017-02-13 NOTE — Telephone Encounter (Signed)
[name] HongJudy left a message saying that she was having problems and needed a call back. I called her back and she said that she was having spasms in her legs and wanted an appointment for Baclofen pump dose adjustment. Because she needs to arrange transportation, I scheduled her for February 27, 2017 at 10:15AM. She knows that she needs to arrive by 10AM. [name] HongJudy also said that she had been having blackout spells again and admitted that she had been under some stress because an old boyfriend had come back in to her life and wanted her to marry him. We talked about stress and how it can affect her. [name] HongJudy had no other questions or concerns at this time. TG

## 2017-02-14 ENCOUNTER — Encounter: Payer: Self-pay | Admitting: Physical Medicine & Rehabilitation

## 2017-02-14 ENCOUNTER — Encounter: Payer: Medicare Other | Attending: Physical Medicine & Rehabilitation

## 2017-02-14 ENCOUNTER — Ambulatory Visit (HOSPITAL_BASED_OUTPATIENT_CLINIC_OR_DEPARTMENT_OTHER): Payer: Medicare Other | Admitting: Physical Medicine & Rehabilitation

## 2017-02-14 VITALS — BP 110/72 | HR 85 | Resp 14

## 2017-02-14 DIAGNOSIS — R252 Cramp and spasm: Secondary | ICD-10-CM | POA: Diagnosis not present

## 2017-02-14 DIAGNOSIS — G808 Other cerebral palsy: Secondary | ICD-10-CM | POA: Diagnosis not present

## 2017-02-14 NOTE — Progress Notes (Signed)
Botox Injection for spasticity using needle EMG guidance  Dilution: 50 Units/ml Indication: Severe spasticity which interferes with ADL,mobility and/or  hygiene and is unresponsive to medication management and other conservative care Informed consent was obtained after describing risks and benefits of the procedure with the patient. This includes bleeding, bruising, infection, excessive weakness, or medication side effects. A REMS form is on file and signed. Needle: 27g 1" needle electrode Number of units per muscle  Biceps100 FCR50 FCU50 FDS50 FDP50  Brachiorad 25 Pronator teres 50 Pronator quad 25 All injections were done after obtaining appropriate EMG activity and after negative drawback for blood. The patient tolerated the procedure well. Post procedure instructions were given. A followup appointment was made.

## 2017-02-14 NOTE — Patient Instructions (Signed)

## 2017-02-19 DIAGNOSIS — I2699 Other pulmonary embolism without acute cor pulmonale: Secondary | ICD-10-CM | POA: Diagnosis not present

## 2017-02-19 DIAGNOSIS — F418 Other specified anxiety disorders: Secondary | ICD-10-CM | POA: Diagnosis not present

## 2017-02-19 DIAGNOSIS — M25561 Pain in right knee: Secondary | ICD-10-CM | POA: Diagnosis not present

## 2017-02-19 DIAGNOSIS — F428 Other obsessive-compulsive disorder: Secondary | ICD-10-CM | POA: Diagnosis not present

## 2017-02-21 ENCOUNTER — Ambulatory Visit: Payer: Medicare Other | Admitting: Gastroenterology

## 2017-02-25 DIAGNOSIS — F422 Mixed obsessional thoughts and acts: Secondary | ICD-10-CM | POA: Diagnosis not present

## 2017-02-27 ENCOUNTER — Encounter (INDEPENDENT_AMBULATORY_CARE_PROVIDER_SITE_OTHER): Payer: Self-pay | Admitting: Pediatrics

## 2017-02-27 ENCOUNTER — Ambulatory Visit (INDEPENDENT_AMBULATORY_CARE_PROVIDER_SITE_OTHER): Payer: Medicare Other | Admitting: Pediatrics

## 2017-02-27 DIAGNOSIS — G808 Other cerebral palsy: Secondary | ICD-10-CM | POA: Diagnosis not present

## 2017-02-27 NOTE — Progress Notes (Signed)
Patient: [name] [name] MRN: [id number] Sex: female DOB: Sep 12, 1976  Provider: Ellison Carwin, MD Location of Care: [facility]  Note type: Routine return visit  History of Present Illness: Referral Source: Melanie March, MD History from: patient and [name] chart Chief Complaint: Spastic/Athetoid Quadriparesis  [name] [name] is a 41 y.o. female who returns on February 27, 2017 for the first time since November 07, 2016 to reprogram her intrathecal baclofen pump.  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  She has quadriparesis.  She is benefited from increasing doses of baclofen.  She has become much more mobile better head control and tribute her improvements to treat her spasticity.  She called a February 13, 2017 to request an increase in intrathecal baclofen.  She is not having spasticity and rigidity the particular time during the day.  She is also complaining of increasing episodes of blackouts.  In the past these have been clearly nonepileptic in nature.  Usually related to increased stress in her life.  Her current stress comes from a old boyfriend came back and her life and wanted her to marry him.  She is in a long-term struggled disorder independence in a situation where she has largely been dependent on others for care for a number of years.  I'm pleased that she is trying to do this, but this also creates increased stress for her.  Procedure: Reprogramming intrathecal baclofen pump  The device was interrogated and showed a complex continuous infusion of baclofen that has a basal rate of 17.0 mcg per hour. The patient receives bolus doses of 27 mcg delivered at midnight, 6 AM, 12 noon, and 6 PM, for total of 474.6 mcg per day.  I reprogrammed the device to deliver a basal rate of 18.7 mcg/h, a 10% increase in basal rate but a 4% increase overall.  No change was made in the bolus doses.  Total daily dose is 494.1 g.  She tolerated the procedure well.  We  will see how well this works to change her spasticity.  Review of Systems: 12 system review was remarkable for pump rate adjustment; blackouts, the remainder was assessed and was negative  Past Medical History Diagnosis Date  . Abdominal pain 01/02/2012   Overview:  Overview:  Per Previous pcp note- Dr. Ta--Pt has history of endometriosis and had DUB in 2011.  She She had u/s that showed normal endometrial stripe.  She had endometrial bx that was wnl.  She has a lot of abd cramping every month.  This cramping was sometimes not relieved with hydrocodone.  She was referred to [facility] for IUD placement to help endometriosis and abd cramping.  [name]  . Abdominal pain 01/02/2012   Overview:  Overview:  Per Previous pcp note- Dr. Ta--Pt has history of endometriosis and had DUB in 2011.  She She had u/s that showed normal endometrial stripe.  She had endometrial bx that was wnl.  She has a lot of abd cramping every month.  This cramping was sometimes not relieved with hydrocodone.  She was referred to [facility] for IUD placement to help endometriosis and abd cramping.  [name] was placed 04/30/11 by [facility].  Pt still complains of abd cramping, which I am treating with hydrocodone and ultram. Pt reports some initial relief of pain from IUD, but states it has now returned back to similar to previous cramping. Gave patient a shot of Toradol 60 mg IM today-since positive exacerbation of abdominal cramping during past 2  weeks. Physical exam reassuring. Patient may need to return to [facility] for further workup since she is plugged in with the GYN providers there for further treatment options regarding endometriosis and abdominal cramping. The dysfunctional uterine bleeding now well contr  . Acute cystitis with hematuria   . Acute cystitis without hematuria   . Acute respiratory failure with hypoxia ([name])   . Anemia   . Cellulitis 12/22/2014  . Cerebral palsy ([name])    Spastic Cerebral palsy, mentally intact    . Contracture, joint, multiple sites    Electric wheelchair, uses left hand to operate chair.   . Depression   . Disease of female genital organs 10/24/2010   Overview:  Overview:  Pt has history of endometriosis and had DUB in 2011.  She had u/s that showed normal endometrial stripe.  She had endometrial bx that was wnl.  She has a lot of abd cramping every month.  This cramping was sometimes not relieved with hydrocodone.  She was referred to [facility] for IUD placement to help endometriosis and abd cramping.  [name] was placed 04/30/11 by [facility]  Pt still complains of abd cramping, which was treated with hydrocodone and ultram.   Last seen at [facility] by OB/GYN 12/24/11, given Depo Lupron injection.  Ordering CT scan to r/o additional cause of abdominal pain (pt with known endometriosis and will not tolerate vaginal U/S, pain not improved s/p [name] or with Lupron injection-- only caused hot flashes and amenorrhea, no pain relief).  Also made referral to pain clinic for better pain control.  Do not think hysterectomy would help with pain, and concern for contamination of baclofen pump if did proceed with surgery.    8/5/13Marilynne [name] OB/GYN: Pt was seen by   . [name]   . Dysarthria   . Dyspnea and respiratory abnormality 02/04/2011   Overview:  Overview:  Pt has history of recurrent PE and is on chronic coumadin.  Pt has been complaining of dyspnea that she describes as chest tightness.  She has been to the ER multiple times for this.  Usually she gets CTA and serial CE.  She has had at least 6 CTA in a period of since 2011.  She also has an O2 requirement. It is unclear why pt feels dyspneic, complains of chest tightness, or has O2 requirement.  I thought that her complaint may be from deconditioning and referred her to PT.  Unfortunately Medicaid only pays for 4 PT visits.  Pt should be doing these exercises at home, but she has not.  I have referred her to [facility], and she has  been seen by [name].  He did not understand the etiology of her dyspnea and O2 requirement.  He will continue to follow her.   Last Assessment & Plan:  Pt presents with concerns for chest tightness and dyspnea.  She was seen in ED yesterday and discharged home.  While there she had a CT chest 04/20/11 showed 1.  Multifocal degradation as detailed above.  N  . Endometriosis   . GERD (gastroesophageal reflux disease)   . Gram positive sepsis ([name]) 10/24/2014  . Gross hematuria 03/12/2013   Overview:  Overview:  status post replacement of suprapubic tube 03/04/2013  Last Assessment & Plan:  Recent urine culture collected in ED on 5/4 did not result in significant growth. Moreover, patient has been asymptomatic with normal WBC, no fever and no true symptoms making symptomatic UTI less likely.  Will continue  to monitor for any sign of infection.  Patient's xarelto had been held during her recent hospitalization due to hematuria. This has since been re-started. Monitor bleeding.    [name] Kitchen. HCAP (healthcare-associated pneumonia) 10/25/2014  . History of anticoagulant therapy 10/21/2012  . History of endometriosis 10/2012   OBGYN [facility]: Laparoscopic Endometrial Ablation, TVH,  . History of recurrent UTIs   . Hypertension   . HYPERTENSION, BENIGN 01/31/2010   Qualifier: Diagnosis of  By: Gala RomneyBensimhon, MD, Trixie DredgeFACC, Daniel R   . Incontinent of feces 03/11/2016  . Infantile cerebral palsy ([name]) 01/29/2007   Overview:  She has spastic CP.  She is mentally intact.  She is wheelchair bound.  She is wheelchair bound for ambulation      . Intracervical pessary 05/07/2011   Overview:  Overview:  [facility] to treat DUB and Endometriosis.  [facility] but was considered a poor surgical candidate and [facility]  For DUB she had pelvic ultrasound that showed thin stripe and she had endometrial Bx by Dr Jennette KettleNeal in GYN clinic, which was negative.     . Macroscopic hematuria  03/10/2013   status post replacement of suprapubic tube 03/04/2013   . Migraine   . OCD (obsessive compulsive disorder)   . Parapneumonic effusion 10/25/2014  . Presence of intrathecal baclofen pump 03/07/2014   R LQ. Insertion T10.    [name] Kitchen. Psychiatric illness 10/31/2014  . Pulmonary embolism ([name])    Lifetime Coumadin  . Pulmonary embolism ([name]) 2011, 01/2011   Will be on lifetime coumadin  . Recurrent pulmonary embolism ([name]) 05/03/2011   Pt has history of recurrent PE and is on chronic coumadin.  Pt has been complaining of dyspnea that she describes as chest tightness.  She has been to the ER multiple times for this.  Usually she gets CTA and serial CE.  She has had at least 6 CTA in a period of since 2011.  She also has an O2 requirement. It is unclear why pt feels dyspneic, complains of chest tightness, or has O2 requirement.  I thought that her complaint may be from deconditioning and referred her to PT.  Unfortunately Medicaid only pays for 4 PT visits.  Pt should be doing these exercises at home, but she has not.  [facility], and she has been seen by Dr Marchelle Gearingamaswamy.  He did not understand the etiology of her dyspnea and O2 requirement.  He will continue to follow her.     . Seborrheic keratosis 01/18/2011  . Seizures ([name]) 02/12/2016  . Spasticity 01/05/2014  . Transient alteration of awareness, recurrent 09/08/2006   Recurrent episodes where [name] ClosJudith  loses contact with her world and that have been studied thoroughly and appear nonepileptic in nature per Dr Terrace ArabiaYan (neurology) The patient has had episodes starting in 2007 that initially began 1-4 times a day during which time she would have periods of unawareness followed by confusion. This continuous video EEG monitoring performed from October 8-12, 2007. Had   . Urethra dilated and patulous 07/03/2015   Patulous, Dilated Urethra. 5mL balloon on a 22-Fr catheter hoping this would prevent the bladder spasm from pushing the balloon down the  urethra (Dr Logan BoresEvans, Urology [facility], July 2016)    Hospitalizations: No., Head Injury: No., Nervous System Infections: No., Immunizations up to date: Yes.    Birth History Extremely premature infant  Behavior History anxiety, depression, non-epileptic loss of consciousness  Surgical History Past Surgical History:  Procedure Laterality Date  . ABDOMINAL HYSTERECTOMY    . APPENDECTOMY    . BACLOFEN PUMP REFILL     x 3 times  . CARPAL TUNNEL RELEASE  08/2008   Dr Teressa Senter  . CESAREAN SECTION     x 2  . CHOLECYSTECTOMY  10/14/2015   Procedure: LAPAROSCOPIC CHOLECYSTECTOMY;  Surgeon: Violeta Gelinas, MD;  Location: [facility] OR;  Service: General;;  . COLPOSCOPY  06/2000  . INTRAUTERINE DEVICE INSERTION  04/30/11   Inserted by [facility] for endometriosis  . laparoscopically assisted Vag Hysterectomy  10/27/2012  . MULTIPLE EXTRACTIONS WITH ALVEOLOPLASTY Bilateral 07/19/2013   Procedure: EXTRACTIONS #4, 4,09,81,19;  Surgeon: Francene Finders, DDS;  Location: [facility] OR;  Service: Oral Surgery;  Laterality: Bilateral;  . NEUROMA SURGERY Left    anterior and posterior intersosseous  . PAIN PUMP IMPLANTATION N/A 03/07/2014   Procedure: baclofen pump revision/replacement and Catheter connection replacement;  Surgeon: Cristi Loron, MD;  Location: [facility];  Service: Neurosurgery;  Laterality: N/A;  baclofen pump revision/replacement and Catheter connection replacement  . PROGRAMABLE BACLOFEN PUMP REVISION  03/17/14   Battery Replacement   . TUBAL LIGATION  2003  . URETHRA SURGERY    . WRIST SURGERY  06/2010   Dr Dierdre Searles, hand surgeon, [name] [name]   Family History family history includes Alzheimer's disease in her paternal grandfather; Asthma in her father; Breast cancer in her paternal grandmother; Colon cancer in her maternal grandmother; Heart attack in her maternal grandfather. Family history is negative for migraines, seizures, intellectual disabilities, blindness, deafness, birth defects,  chromosomal disorder, or autism.  Social History . Marital status: Divorced  . Number of children: 2  . Years of education: college   Occupational History  .  Unemployed   Social History Main Topics  . Smoking status: Never Smoker  . Smokeless tobacco: Never Used  . Alcohol use 0.6 oz/week    1 Glasses of wine per week     Comment: Drinks alcohol once a month.  . Drug use: No  . 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     She enjoys shopping.    Lives in a nursing facility[facility] since 02/11[facility]    [name] [name], who has schizophrenia, MR, is father of daughter.  Homero Fellers and pt are no longer together.  Homero Fellers does not see [name] [name]. Daughter is 62 yo, Stage manager, lives with pt.    [name] [name] Fus [phone]), lives with pt's parents.    Needs assistance with ADL's and IADL's--has personal care services 20 hrs/wk;     No tobacco, EtOH, drugs.    **Patient very concerned about her kids being taken from her.    **Case Manager: Jack Quarto [phone]    Patient lives [facility]   Allergies Allergen Reactions  . Morphine Dermatitis and Other (See Comments)    Skin turned red   Physical Exam LMP 04/05/2011   Not examined today  Assessment Cerebral palsy, quadriplegic, G 80.8. 2.  Athetoid cerebral palsy, G 80.3.  Discussion [name] [name] is stable physically and neurologically.  I believe that her spasticity is worse.  Plan Her intrathecal baclofen pump was programmed as noted above.  This will move up her refill date to Apr 14, 2017.  We will not make any additional changes until then.   Medication List   Accurate as of 02/27/17 11:10 AM.  acetaminophen 325 MG tablet Commonly known as:  TYLENOL Take 650 mg by mouth. Every 6 hours as needed   amantadine 100 MG capsule Commonly known as:  SYMMETREL Take 100 mg by mouth. Take one tablet daily    antiseptic oral rinse Liqd 15 mLs by Mouth Rinse route as needed for dry mouth.   baclofen 20 MG tablet Commonly known as:  LIORESAL Take 20 mg by mouth 4 (four) times daily. For bladder spasms   bisacodyl 10 MG suppository Commonly known as:  DULCOLAX Place 10 mg rectally as needed for moderate constipation.   cephALEXin 500 MG capsule Commonly known as:  KEFLEX Take 1 capsule (500 mg total) by mouth 2 (two) times daily.   CERTA-VITE PO Take 1 tablet by mouth daily.   clonazePAM 0.5 MG tablet Commonly known as:  KLONOPIN Take 0.5 mg by mouth. Take one three times daily   feeding supplement (ENSURE ENLIVE) Liqd Take 237 mLs by mouth 2 (two) times daily between meals.   FLUoxetine 20 MG tablet Commonly known as:  PROZAC Take 20 mg by mouth daily.   ibuprofen 200 MG tablet Commonly known as:  ADVIL,MOTRIN Take 200 mg by mouth. Take 2 tablets every 8 hours as needed   lamoTRIgine 100 MG tablet Commonly known as:  LAMICTAL Take 100 mg by mouth at bedtime.   lubiprostone 24 MCG capsule Commonly known as:  AMITIZA Take 1 capsule (24 mcg total) by mouth 2 (two) times daily with a meal.   Melatonin 5 MG Tabs Take by mouth. Take one tablet at bedtime   oxybutynin 5 MG tablet Commonly known as:  DITROPAN Take 5 mg by mouth. Take one tablet four times a day   oxyCODONE 5 MG immediate release tablet Commonly known as:  ROXICODONE Take one tablet by mouth every 4 hours as needed for pain   polyethylene glycol powder powder Commonly known as:  GLYCOLAX/MIRALAX Take 1  1/2 dose  daily in 12 ounces of fluid every day.   QUEtiapine 100 MG tablet Commonly known as:  SEROQUEL Take 1 tablet (100 mg total) by mouth at bedtime.   senna 8.6 MG Tabs tablet Commonly known as:  SENOKOT 2 nightly to help constipation   sodium phosphate 7-19 GM/118ML Enem Place 133 mLs (1 enema total) rectally 2 (two) times a week.   tiZANidine 4 MG capsule Commonly known as:  ZANAFLEX Take  4 mg by mouth 2 (two) times daily at 8 am and 10 pm.   warfarin 7.5 MG tablet Commonly known as:  COUMADIN 8.5mg  daily to anticoagulate blood to prevent pulmonary emboli. May give as 7.5 mg plus 1 mg tablet daily.    The medication list was reviewed and reconciled. All changes or newly prescribed medications were explained.  A complete medication list was provided to the patient/caregiver.  Deetta Perla MD

## 2017-02-28 DIAGNOSIS — I2690 Septic pulmonary embolism without acute cor pulmonale: Secondary | ICD-10-CM | POA: Diagnosis not present

## 2017-02-28 DIAGNOSIS — R42 Dizziness and giddiness: Secondary | ICD-10-CM | POA: Diagnosis not present

## 2017-02-28 DIAGNOSIS — K219 Gastro-esophageal reflux disease without esophagitis: Secondary | ICD-10-CM | POA: Diagnosis not present

## 2017-02-28 DIAGNOSIS — A419 Sepsis, unspecified organism: Secondary | ICD-10-CM | POA: Diagnosis not present

## 2017-02-28 DIAGNOSIS — N390 Urinary tract infection, site not specified: Secondary | ICD-10-CM | POA: Diagnosis not present

## 2017-03-07 DIAGNOSIS — Z90710 Acquired absence of both cervix and uterus: Secondary | ICD-10-CM | POA: Diagnosis not present

## 2017-03-07 DIAGNOSIS — G894 Chronic pain syndrome: Secondary | ICD-10-CM | POA: Diagnosis not present

## 2017-03-07 DIAGNOSIS — Z86711 Personal history of pulmonary embolism: Secondary | ICD-10-CM | POA: Diagnosis not present

## 2017-03-07 DIAGNOSIS — G809 Cerebral palsy, unspecified: Secondary | ICD-10-CM | POA: Diagnosis not present

## 2017-03-07 DIAGNOSIS — N3941 Urge incontinence: Secondary | ICD-10-CM | POA: Diagnosis not present

## 2017-03-07 DIAGNOSIS — Z86718 Personal history of other venous thrombosis and embolism: Secondary | ICD-10-CM | POA: Diagnosis not present

## 2017-03-07 DIAGNOSIS — K5909 Other constipation: Secondary | ICD-10-CM | POA: Diagnosis not present

## 2017-03-07 DIAGNOSIS — G800 Spastic quadriplegic cerebral palsy: Secondary | ICD-10-CM | POA: Diagnosis not present

## 2017-03-07 DIAGNOSIS — N319 Neuromuscular dysfunction of bladder, unspecified: Secondary | ICD-10-CM | POA: Diagnosis not present

## 2017-03-07 DIAGNOSIS — Z7901 Long term (current) use of anticoagulants: Secondary | ICD-10-CM | POA: Diagnosis not present

## 2017-03-07 DIAGNOSIS — N39498 Other specified urinary incontinence: Secondary | ICD-10-CM | POA: Diagnosis not present

## 2017-03-07 DIAGNOSIS — I10 Essential (primary) hypertension: Secondary | ICD-10-CM | POA: Diagnosis not present

## 2017-03-08 DIAGNOSIS — I10 Essential (primary) hypertension: Secondary | ICD-10-CM | POA: Diagnosis not present

## 2017-03-08 DIAGNOSIS — N319 Neuromuscular dysfunction of bladder, unspecified: Secondary | ICD-10-CM | POA: Diagnosis not present

## 2017-03-08 DIAGNOSIS — G800 Spastic quadriplegic cerebral palsy: Secondary | ICD-10-CM | POA: Diagnosis not present

## 2017-03-08 DIAGNOSIS — G894 Chronic pain syndrome: Secondary | ICD-10-CM | POA: Diagnosis not present

## 2017-03-08 DIAGNOSIS — N3941 Urge incontinence: Secondary | ICD-10-CM | POA: Diagnosis not present

## 2017-03-08 DIAGNOSIS — K5909 Other constipation: Secondary | ICD-10-CM | POA: Diagnosis not present

## 2017-03-18 DIAGNOSIS — R339 Retention of urine, unspecified: Secondary | ICD-10-CM | POA: Diagnosis not present

## 2017-03-19 DIAGNOSIS — B372 Candidiasis of skin and nail: Secondary | ICD-10-CM | POA: Diagnosis not present

## 2017-03-25 ENCOUNTER — Ambulatory Visit (INDEPENDENT_AMBULATORY_CARE_PROVIDER_SITE_OTHER): Payer: Medicare Other

## 2017-03-25 ENCOUNTER — Ambulatory Visit (INDEPENDENT_AMBULATORY_CARE_PROVIDER_SITE_OTHER): Payer: Medicare Other | Admitting: Orthopaedic Surgery

## 2017-03-25 ENCOUNTER — Encounter (INDEPENDENT_AMBULATORY_CARE_PROVIDER_SITE_OTHER): Payer: Self-pay | Admitting: Orthopaedic Surgery

## 2017-03-25 DIAGNOSIS — G801 Spastic diplegic cerebral palsy: Secondary | ICD-10-CM | POA: Diagnosis not present

## 2017-03-25 DIAGNOSIS — M542 Cervicalgia: Secondary | ICD-10-CM

## 2017-03-25 NOTE — Progress Notes (Signed)
Office Visit Note   Patient: [name] [name]           Date of Birth: Apr 04, 1976           MRN: [id number] Visit Date: 03/25/2017              Requested by: No referring provider defined for this encounter. PCP: Pcp Not In System   Assessment & Plan: Visit Diagnoses:  1. Neck pain   2. CP (cerebral palsy), spastic ([name])     Plan: Patient probably cannot have an MRI scan with her baclofen pump. Diagnostic test would have to be a cervical myelogram CT scan for evaluation of a of her cervical spine to rule out nerve compression. We tried a soft collar which she is unable to apply. Her plain radiograph shows significant anterior spurring. We discussed that if the surgery is considered for compression lesion then she might need to have additional supportive fusion was required with both an anterior and posterior procedure due to her severe degree of spasticity despite of baclofen pump as well as oral baclofen.  Follow-Up Instructions: Follow-up after cervical spine imaging studies  Orders:  Orders Placed This Encounter  Procedures  . XR Cervical Spine 2 or 3 views  . DG Myelogram Cervical  . CT CERVICAL SPINE W CONTRAST   No orders of the defined types were placed in this encounter.     Procedures: No procedures performed   Clinical Data: No additional findings.   Subjective: Chief Complaint  Patient presents with  . Neck - Pain    HPI patient is here with acute on chronic neck pain. She has severe spastic athetoid quadriparesis and is in a custom wheelchair with head rest. She is not able use her rescue per next rate and keeps her neck turned with rotation of the right as well as tilting to the right. When she tries to have her neck straightened her right arm goes into extension with pain that shoots under her right arm. Patient has suprapubic catheter that she is a change monthly no recent signs or symptoms of a suggest UTI or sepsis. She has a baclofen pump which is  intrathecal. She had additional problems with recurrent endometriosis abdominal cramping. Patient's had a history of multiple PEs as well. They have tried different headrest for her wheelchair also tried a soft neck brace without relief. She had a sharp pain in her neck that radiates into her shoulders. She has elbow and wrist contractures from the cerebral palsy with the quadriplegia and spasticity. She has athetoid component to her spasticity.  Review of Systems patient wears glasses she complains of neck pain and spasms. Contractures upper and lower extremities she is in a custom wheelchair. [place] laparoscopic cholecystectomy 2016. Endometriosis. She has a baclofen pump which is been the revised as late as 2015. She had wrist surgery at [facility] urethral surgery tubal ligation, appendectomy, abdominal hysterectomy, carpal tunnel release. Patient has a CNA who helps her with getting dressed she tries to do many things on her own. Review of systems otherwise negative as it pertains history of present illness.   Objective: Vital Signs: LMP 04/05/2011   Physical Exam  Constitutional:  Spastic CP quadriplegia with the elbow wrist the flexion deformities.  HENT:  Head: Atraumatic.  Eyes:  Patient has glasses  Neck:  Patient rotation and tilting to the right of 45 with tenderness paracervical muscles both right and left brachial plexus tenderness both right and left. With attempts to  gently get her head straight she has acute pain in the right upper extremity and has to extend her arm at the shoulder to help with this pain and cannot keep it in a normal position. She has some degree of abduction with the extension to help with the pain. No subluxation of the shoulder joints. Wrist and  finger flexion deformities  Cardiovascular: Normal rate.   Pulmonary/Chest: Effort normal.  Abdominal: She exhibits no distension.  Genitourinary:  Genitourinary Comments: Suprapubic catheter  Musculoskeletal:  The  cervical spine is tilted to the right with flexion deformity. Patient reports tenderness bilaterally. Hyperreflexia upper lower extremities. She has some athetoid motion to her spasticity.  Psychiatric:  Patient's alert conversant , pleasant and cooperative    Ortho Exam  Specialty Comments:  No specialty comments available.  Imaging: Xr Cervical Spine 2 Or 3 Views  Result Date: 03/25/2017 AP lateral cervical spine x-rays obtained. Patient has the 45 tilt of her neck to the right. On lateral x-ray she has anterior spurring at C4-5, C5-6 and to a lesser degree C6-7. Neck is in a flexed position lacking about 3 fingerbreadths chin to chest. Impression: Mid cervical anterior spurring with significant rotation and flexion of the cervical spine    PMFS History: Patient Active Problem List   Diagnosis Date Noted  . Incontinent of feces 03/11/2016  . Chronic anticoagulation 03/11/2016  . Involuntary movements 02/22/2016  . Seizures ([name]) 02/12/2016  . Dyspnea 02/12/2016  . Cerebral palsy, quadriplegic ([name]) 02/06/2016  . Swelling of extremity 01/13/2016  . Pressure ulcer 10/09/2015  . Status post insertion of inferior vena caval filter 09/28/2015  . Absence of bladder continence 08/25/2015  . Person living in residential institution 06/14/2015  . Pulmonary hypertension ([name])   . Anemia of chronic disease   . Spasticity 03/15/2015  . Constipation, chronic 10/10/2014  . Athetoid cerebral palsy ([name]) 04/13/2014  . Dystonia 04/12/2014  . Presence of intrathecal baclofen pump 03/07/2014  . Dental caries 07/18/2013  . Multiple thyroid nodules 05/20/2013  . Osteoarthrosis 04/07/2013  . [name]   . Dysarthria   . GERD (gastroesophageal reflux disease)   . Neurogenic bladder 02/05/2013  . Anxiety state 10/21/2012  . Chronic pain syndrome 06/17/2012  . Recurrent pulmonary embolism ([name]) 05/03/2011  . Pulmonary embolism with infarction ([name]) 05/03/2011  .  Chronic suprapubic catheter ([name]) 03/15/2011  . Disease of female genital organs 10/24/2010  . HYPERTENSION, BENIGN 01/31/2010  . CP (cerebral palsy), spastic ([name]) 12/27/2008  . Contracted, joint, multiple sites 12/27/2008  . Major depressive disorder, recurrent episode ([name]) 01/29/2007  . Obsessive Compulsive Disorder 01/29/2007  . Transient alteration of awareness, recurrent 09/08/2006   Past Medical History:  Diagnosis Date  . Abdominal pain 01/02/2012   Overview:  Overview:  Per Previous pcp note- Dr. Ta--Pt has history of endometriosis and had DUB in 2011.  She She had u/s that showed normal endometrial stripe.  She had endometrial bx that was wnl.  She has a lot of abd cramping every month.  This cramping was sometimes not relieved with hydrocodone.  She was referred to [facility] for IUD placement to help endometriosis and abd cramping.  [name]  . Abdominal pain 01/02/2012   Overview:  Overview:  Per Previous pcp note- Dr. Ta--Pt has history of endometriosis and had DUB in 2011.  She She had u/s that showed normal endometrial stripe.  She had endometrial bx that was wnl.  She has a lot of abd cramping every  month.  This cramping was sometimes not relieved with hydrocodone.  She was referred to [facility] for IUD placement to help endometriosis and abd cramping.  [name] was placed 04/30/11 by [facility].  Pt still complains of abd cramping, which I am treating with hydrocodone and ultram. Pt reports some initial relief of pain from IUD, but states it has now returned back to similar to previous cramping. Gave patient a shot of Toradol 60 mg IM today-since positive exacerbation of abdominal cramping during past 2 weeks. Physical exam reassuring. Patient may need to return to [facility] for further workup since she is plugged in with the GYN providers there for further treatment options regarding endometriosis and abdominal cramping. The dysfunctional uterine bleeding now well contr  . Acute cystitis  with hematuria   . Acute cystitis without hematuria   . Acute respiratory failure with hypoxia ([name])   . Anemia   . Cellulitis 12/22/2014  . Cerebral palsy ([name])    Spastic Cerebral palsy, mentally intact  . Contracture, joint, multiple sites    Electric wheelchair, uses left hand to operate chair.   . Depression   . Disease of female genital organs 10/24/2010   Overview:  Overview:  Pt has history of endometriosis and had DUB in 2011.  She had u/s that showed normal endometrial stripe.  She had endometrial bx that was wnl.  She has a lot of abd cramping every month.  This cramping was sometimes not relieved with hydrocodone.  She was referred to [facility] for IUD placement to help endometriosis and abd cramping.  [name] was placed 04/30/11 by [facility].  Pt still complains of abd cramping, which was treated with hydrocodone and ultram.   Last seen at [facility] by OB/GYN 12/24/11, given Depo Lupron injection.  Ordering CT scan to r/o additional cause of abdominal pain (pt with known endometriosis and will not tolerate vaginal U/S, pain not improved s/p [name] or with Lupron injection-- only caused hot flashes and amenorrhea, no pain relief).  Also made referral to pain clinic for better pain control.  Do not think hysterectomy would help with pain, and concern for contamination of baclofen pump if did proceed with surgery.    8/5/13[name] OB/GYN: Pt was seen by   . [name]   . Dysarthria   . Dyspnea and respiratory abnormality 02/04/2011   Overview:  Overview:  Pt has history of recurrent PE and is on chronic coumadin.  Pt has been complaining of dyspnea that she describes as chest tightness.  She has been to the ER multiple times for this.  Usually she gets CTA and serial CE.  She has had at least 6 CTA in a period of since 2011.  She also has an O2 requirement. It is unclear why pt feels dyspneic, complains of chest tightness, or has O2 requirement.  I thought that her complaint  may be from deconditioning and referred her to PT.  Unfortunately Medicaid only pays for 4 PT visits.  Pt should be doing these exercises at home, but she has not.  I have referred her to [facility], and she has been seen by [name].  He did not understand the etiology of her dyspnea and O2 requirement.  He will continue to follow her.   Last Assessment & Plan:  Pt presents with concerns for chest tightness and dyspnea.  She was seen in ED yesterday and discharged home.  While there she had a CT chest 04/20/11 showed 1.  Multifocal degradation as detailed above.  N  . Endometriosis   . GERD (gastroesophageal reflux disease)   . Gram positive sepsis ([name]) 10/24/2014  . Gross hematuria 03/12/2013   Overview:  Overview:  status post replacement of suprapubic tube 03/04/2013  Last Assessment & Plan:  Recent urine culture collected in ED on 5/4 did not result in significant growth. Moreover, patient has been asymptomatic with normal WBC, no fever and no true symptoms making symptomatic UTI less likely.  Will continue to monitor for any sign of infection.  Patient's xarelto had been held during her recent hospitalization due to hematuria. This has since been re-started. Monitor bleeding.    [name] Kitchen HCAP (healthcare-associated pneumonia) 10/25/2014  . History of anticoagulant therapy 10/21/2012  . History of endometriosis 10/2012   OBGYN [facility]: Laparoscopic Endometrial Ablation, TVH,  . History of recurrent UTIs   . Hypertension   . HYPERTENSION, BENIGN 01/31/2010   Qualifier: Diagnosis of  By: [name], MD, [name] Incontinent of feces 03/11/2016  . Infantile cerebral palsy ([name]) 01/29/2007   Overview:  She has spastic CP.  She is mentally intact.  She is wheelchair bound.  She is wheelchair bound for ambulation      . Intracervical pessary 05/07/2011   Overview:  Overview:  Placed by [facility] GYN 04/30/11 to treat DUB and Endometriosis.  She is seen at GYN clinic at [facility] but was considered a poor  surgical candidate and referred her to [facility].  For DUB she had pelvic ultrasound that showed thin stripe and she had endometrial Bx by Dr Jennette Kettle in GYN clinic, which was negative.     . Macroscopic hematuria 03/10/2013   status post replacement of suprapubic tube 03/04/2013   . Migraine   . OCD (obsessive compulsive disorder)   . Parapneumonic effusion 10/25/2014  . Presence of intrathecal baclofen pump 03/07/2014   R LQ. Insertion T10.    [name] Kitchen Psychiatric illness 10/31/2014  . Pulmonary embolism ([name])    Lifetime Coumadin  . Pulmonary embolism ([name]) 2011, 01/2011   Will be on lifetime coumadin  . Recurrent pulmonary embolism ([name]) 05/03/2011   Pt has history of recurrent PE and is on chronic coumadin.  Pt has been complaining of dyspnea that she describes as chest tightness.  She has been to the ER multiple times for this.  Usually she gets CTA and serial CE.  She has had at least 6 CTA in a period of since 2011.  She also has an O2 requirement. It is unclear why pt feels dyspneic, complains of chest tightness, or has O2 requirement.  I thought that her complaint may be from deconditioning and referred her to PT.  Unfortunately Medicaid only pays for 4 PT visits.  Pt should be doing these exercises at home, but she has not.  I have referred her to [facility], and she has been seen by [name].  He did not understand the etiology of her dyspnea and O2 requirement.  He will continue to follow her.     . Seborrheic keratosis 01/18/2011  . Seizures ([name]) 02/12/2016  . Spasticity 01/05/2014  . Transient alteration of awareness, recurrent 09/08/2006   Recurrent episodes where Jleigh  loses contact with her world and that have been studied thoroughly and appear nonepileptic in nature per Dr Terrace Arabia (neurology) The patient has had episodes starting in 2007 that initially began 1-4 times a day during which time she would have periods of unawareness followed by confusion.  This continuous video EEG monitoring performed from October  8-12, 2007. Had   . Urethra dilated and patulous 07/03/2015   Patulous, Dilated Urethra. 5mL balloon on a 22-Fr catheter hoping this would prevent the bladder spasm from pushing the balloon down the urethra (Dr Logan Bores, Urology [facility], July 2016)     Family History  Problem Relation Age of Onset  . Asthma Father   . Colon cancer Maternal Grandmother     Died in her 74's  . Breast cancer Paternal Grandmother     Died in her 52's  . Heart attack Maternal Grandfather     Died in his 43's  . Alzheimer's disease Paternal Grandfather     Died in his 55's  . Stomach cancer Neg Hx     Past Surgical History:  Procedure Laterality Date  . ABDOMINAL HYSTERECTOMY    . APPENDECTOMY    . BACLOFEN PUMP REFILL     x 3 times  . CARPAL TUNNEL RELEASE  08/2008   Dr Teressa Senter  . CESAREAN SECTION     x 2  . CHOLECYSTECTOMY  10/14/2015   Procedure: LAPAROSCOPIC CHOLECYSTECTOMY;  Surgeon: Violeta Gelinas, MD;  Location[facility] OR;  Service: General;;  . COLPOSCOPY  06/2000  . INTRAUTERINE DEVICE INSERTION  04/30/11   Inserted by [facility] GYN for endometriosis  . laparoscopically assisted Vag Hysterectomy  10/27/2012  . MULTIPLE EXTRACTIONS WITH ALVEOLOPLASTY Bilateral 07/19/2013   Procedure: EXTRACTIONS #4, 1,61,09,60;  Surgeon: Francene Finders, DDS;  Location: [facility] OR;  Service: Oral Surgery;  Laterality: Bilateral;  . NEUROMA SURGERY Left    anterior and posterior intersosseous  . PAIN PUMP IMPLANTATION N/A 03/07/2014   Procedure: baclofen pump revision/replacement and Catheter connection replacement;  Surgeon: Cristi Loron, MD;  Location: [facility] OR[facility];  Service: Neurosurgery;  Laterality: N/A;  baclofen pump revision/replacement and Catheter connection replacement  . PROGRAMABLE BACLOFEN PUMP REVISION  03/17/14   Battery Replacement   . TUBAL LIGATION  2003  . URETHRA SURGERY    . WRIST SURGERY  06/2010   Dr Dierdre Searles, hand surgeon, [facility]   Social History   Occupational History  .  Unemployed    Social History Main Topics  . Smoking status: Never Smoker  . Smokeless tobacco: Never Used  . Alcohol use 0.6 oz/week    1 Glasses of wine per week     Comment: Drinks alcohol once a month.  . Drug use: No  . Sexual activity: No

## 2017-03-27 ENCOUNTER — Telehealth (INDEPENDENT_AMBULATORY_CARE_PROVIDER_SITE_OTHER): Payer: Self-pay

## 2017-03-27 NOTE — Telephone Encounter (Signed)
Patient called about status of her MRI.  CB# is [phone]

## 2017-03-27 NOTE — Telephone Encounter (Signed)
I called pt no answer, left message to return my call

## 2017-03-27 NOTE — Telephone Encounter (Signed)
Pt called back and I explained to her the best I could that order was sen to IR and will call us to schedule appt.

## 2017-03-28 ENCOUNTER — Ambulatory Visit: Payer: Medicare Other | Admitting: Physical Medicine & Rehabilitation

## 2017-03-31 DIAGNOSIS — M6281 Muscle weakness (generalized): Secondary | ICD-10-CM | POA: Diagnosis not present

## 2017-03-31 DIAGNOSIS — R293 Abnormal posture: Secondary | ICD-10-CM | POA: Diagnosis not present

## 2017-03-31 DIAGNOSIS — R278 Other lack of coordination: Secondary | ICD-10-CM | POA: Diagnosis not present

## 2017-03-31 DIAGNOSIS — G809 Cerebral palsy, unspecified: Secondary | ICD-10-CM | POA: Diagnosis not present

## 2017-03-31 DIAGNOSIS — R41841 Cognitive communication deficit: Secondary | ICD-10-CM | POA: Diagnosis not present

## 2017-03-31 DIAGNOSIS — R2681 Unsteadiness on feet: Secondary | ICD-10-CM | POA: Diagnosis not present

## 2017-04-01 ENCOUNTER — Encounter: Payer: Medicare Other | Attending: Physical Medicine & Rehabilitation

## 2017-04-01 ENCOUNTER — Encounter: Payer: Self-pay | Admitting: Physical Medicine & Rehabilitation

## 2017-04-01 ENCOUNTER — Ambulatory Visit (HOSPITAL_BASED_OUTPATIENT_CLINIC_OR_DEPARTMENT_OTHER): Payer: Medicare Other | Admitting: Physical Medicine & Rehabilitation

## 2017-04-01 ENCOUNTER — Telehealth (INDEPENDENT_AMBULATORY_CARE_PROVIDER_SITE_OTHER): Payer: Self-pay | Admitting: Orthopaedic Surgery

## 2017-04-01 VITALS — BP 91/60 | HR 83

## 2017-04-01 DIAGNOSIS — G808 Other cerebral palsy: Secondary | ICD-10-CM

## 2017-04-01 DIAGNOSIS — R252 Cramp and spasm: Secondary | ICD-10-CM | POA: Diagnosis not present

## 2017-04-01 NOTE — Progress Notes (Signed)
Subjective:    Patient ID: [name] [name], female    DOB: [birth year], 41 y.o.   MRN: [id number]  Botox injection 02/14/2017 Right Biceps100 FCR50 FCU50 FDS50 FDP50  Brachiorad 25 Pronator teres 50 Pronator quad 25 HPI Considering surgery for neck pain Patient feels right upper extremity spasticity has increased over time. There has been benefit from the Botox. However in reducing her spasticity.  Pain Inventory Average Pain 4 Pain Right Now 4 My pain is n/a  In the last 24 hours, has pain interfered with the following? General activity 0 Relation with others 0 Enjoyment of life 0 What TIME of day is your pain at its worst? n/a Sleep (in general) Fair  Pain is worse with: unsure Pain improves with: n/a Relief from Meds: n/a  Mobility use a wheelchair needs help with transfers  Function disabled: date disabled .  Neuro/Psych No problems in this area  Prior Studies Any changes since last visit?  no  Physicians involved in your care Any changes since last visit?  no   Family History  Problem Relation Age of Onset  . Asthma Father   . Colon cancer Maternal Grandmother     Died in her 67's  . Breast cancer Paternal Grandmother     Died in her 4's  . Heart attack Maternal Grandfather     Died in his 74's  . Alzheimer's disease Paternal Grandfather     Died in his 84's  . Stomach cancer Neg Hx    Social History   Social History  . Marital status: Divorced    Spouse name: N/A  . Number of children: 2  . Years of education: college   Occupational History  .  Unemployed   Social History Main Topics  . Smoking status: Never Smoker  . Smokeless tobacco: Never Used  . Alcohol use 0.6 oz/week    1 Glasses of wine per week     Comment: Drinks alcohol once a month.  . Drug use: No  . Sexual activity: No   Other Topics Concern  . 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    She enjoys shopping.   Lives in a nursing facility,[facility] since 02/11/16      Ailene Ards, who has schizophrenia, MR, is father of daughter.  Homero Fellers and pt are no longer together.  Homero Fellers does not see [name]. Daughter is 28 yo, Stage manager, lives with pt.        [name] [phone]), lives with pt's parents.      Needs assistance with ADL's and IADL's--has personal care services 20 hrs/wk;       No tobacco, EtOH, drugs.      **Patient very concerned about her kids being taken from her.      **Case Manager: Jack Quarto [phone]      Patient lives [facility]   Past Surgical History:  Procedure Laterality Date  . ABDOMINAL HYSTERECTOMY    . APPENDECTOMY    . BACLOFEN PUMP REFILL     x 3 times  . CARPAL TUNNEL RELEASE  08/2008   Dr Teressa Senter  . CESAREAN SECTION     x 2  . CHOLECYSTECTOMY  10/14/2015   Procedure: LAPAROSCOPIC CHOLECYSTECTOMY;  Surgeon: Violeta Gelinas, MD;  Location: [facility] OR;  Service: General;;  . COLPOSCOPY  06/2000  . INTRAUTERINE DEVICE INSERTION  04/30/11   Inserted by [facility] GYN for  endometriosis  . laparoscopically assisted Vag Hysterectomy  10/27/2012  . MULTIPLE EXTRACTIONS WITH ALVEOLOPLASTY Bilateral 07/19/2013   Procedure: EXTRACTIONS #4, 1,61,09,60;  Surgeon: Francene Finders, DDS;  Location: [facility] OR;  Service: Oral Surgery;  Laterality: Bilateral;  . NEUROMA SURGERY Left    anterior and posterior intersosseous  . PAIN PUMP IMPLANTATION N/A 03/07/2014   Procedure: baclofen pump revision/replacement and Catheter connection replacement;  Surgeon: Cristi Loron, MD;  Location: [facility] OR[facility];  Service: Neurosurgery;  Laterality: N/A;  baclofen pump revision/replacement and Catheter connection replacement  . PROGRAMABLE BACLOFEN PUMP REVISION  03/17/14   Battery Replacement   . TUBAL LIGATION  2003  . URETHRA SURGERY    . WRIST SURGERY  06/2010   Dr Dierdre Searles, hand surgeon, [name]   Past Medical History:  Diagnosis Date  .  Abdominal pain 01/02/2012   Overview:  Overview:  Per Previous pcp note- Dr. Ta--Pt has history of endometriosis and had DUB in 2011.  She She had u/s that showed normal endometrial stripe.  She had endometrial bx that was wnl.  She has a lot of abd cramping every month.  This cramping was sometimes not relieved with hydrocodone.  She was referred to [facility] for IUD placement to help endometriosis and abd cramping.  [name]  . Abdominal pain 01/02/2012   Overview:  Overview:  Per Previous pcp note- Dr. Ta--Pt has history of endometriosis and had DUB in 2011.  She She had u/s that showed normal endometrial stripe.  She had endometrial bx that was wnl.  She has a lot of abd cramping every month.  This cramping was sometimes not relieved with hydrocodone.  She was referred to [facility] for IUD placement to help endometriosis and abd cramping.  [name] was placed 04/30/11 by [facility].  Pt still complains of abd cramping, which I am treating with hydrocodone and ultram. Pt reports some initial relief of pain from IUD, but states it has now returned back to similar to previous cramping. Gave patient a shot of Toradol 60 mg IM today-since positive exacerbation of abdominal cramping during past 2 weeks. Physical exam reassuring. Patient may need to return to [facility] for further workup since she is plugged in with the GYN providers there for further treatment options regarding endometriosis and abdominal cramping. The dysfunctional uterine bleeding now well contr  . Acute cystitis with hematuria   . Acute cystitis without hematuria   . Acute respiratory failure with hypoxia ([name])   . Anemia   . Cellulitis 12/22/2014  . Cerebral palsy ([name])    Spastic Cerebral palsy, mentally intact  . Contracture, joint, multiple sites    Electric wheelchair, uses left hand to operate chair.   . Depression   . Disease of female genital organs 10/24/2010   Overview:  Overview:  Pt has history of endometriosis and had DUB in  2011.  She had u/s that showed normal endometrial stripe.  She had endometrial bx that was wnl.  She has a lot of abd cramping every month.  This cramping was sometimes not relieved with hydrocodone.  She was referred to [facility] for IUD placement to help endometriosis and abd cramping.  [name] was placed 04/30/11 by [facility].  Pt still complains of abd cramping, which was treated with hydrocodone and ultram.   Last seen at [facility] by OB/GYN 12/24/11, given Depo Lupron injection.  Ordering CT scan to r/o additional cause of abdominal pain (pt with known endometriosis and will not tolerate vaginal  U/S, pain not improved s/p [name] or with Lupron injection-- only caused hot flashes and amenorrhea, no pain relief).  Also made referral to pain clinic for better pain control.  Do not think hysterectomy would help with pain, and concern for contamination of baclofen pump if did proceed with surgery.    8/5/13[name]: Pt was seen by   . [name]   . Dysarthria   . Dyspnea and respiratory abnormality 02/04/2011   Overview:  Overview:  Pt has history of recurrent PE and is on chronic coumadin.  Pt has been complaining of dyspnea that she describes as chest tightness.  She has been to the ER multiple times for this.  Usually she gets CTA and serial CE.  She has had at least 6 CTA in a period of since 2011.  She also has an O2 requirement. It is unclear why pt feels dyspneic, complains of chest tightness, or has O2 requirement.  I thought that her complaint may be from deconditioning and referred her to PT.  Unfortunately Medicaid only pays for 4 PT visits.  Pt should be doing these exercises at home, but she has not.  I have referred her to [facility], and she has been seen by [name].  He did not understand the etiology of her dyspnea and O2 requirement.  He will continue to follow her.   Last Assessment & Plan:  Pt presents with concerns for chest tightness and dyspnea.  She was seen in ED  yesterday and discharged home.  While there she had a CT chest 04/20/11 showed 1.  Multifocal degradation as detailed above.  N  . Endometriosis   . GERD (gastroesophageal reflux disease)   . Gram positive sepsis ([name]) 10/24/2014  . Gross hematuria 03/12/2013   Overview:  Overview:  status post replacement of suprapubic tube 03/04/2013  Last Assessment & Plan:  Recent urine culture collected in ED on 5/4 did not result in significant growth. Moreover, patient has been asymptomatic with normal WBC, no fever and no true symptoms making symptomatic UTI less likely.  Will continue to monitor for any sign of infection.  Patient's xarelto had been held during her recent hospitalization due to hematuria. This has since been re-started. Monitor bleeding.    [name] Kitchen HCAP (healthcare-associated pneumonia) 10/25/2014  . History of anticoagulant therapy 10/21/2012  . History of endometriosis 10/2012   OBGYN [facility]: Laparoscopic Endometrial Ablation, TVH,  . History of recurrent UTIs   . Hypertension   . HYPERTENSION, BENIGN 01/31/2010   Qualifier: Diagnosis of  By: [name], MD, [name] Incontinent of feces 03/11/2016  . Infantile cerebral palsy ([name]) 01/29/2007   Overview:  She has spastic CP.  She is mentally intact.  She is wheelchair bound.  She is wheelchair bound for ambulation      . Intracervical pessary 05/07/2011   Overview:  Overview:  Placed by [facility] GYN 04/30/11 to treat DUB and Endometriosis.  She is seen at GYN clinic at [facility] but was considered a poor surgical candidate and referred her to [facility].  For DUB she had pelvic ultrasound that showed thin stripe and she had endometrial Bx by Dr Jennette Kettle in GYN clinic, which was negative.     . Macroscopic hematuria 03/10/2013   status post replacement of suprapubic tube 03/04/2013   . Migraine   . OCD (obsessive compulsive disorder)   . Parapneumonic effusion 10/25/2014  . Presence of intrathecal baclofen pump 03/07/2014  R LQ. Insertion T10.    [name] Kitchen  Psychiatric illness 10/31/2014  . Pulmonary embolism ([name])    Lifetime Coumadin  . Pulmonary embolism ([name]) 2011, 01/2011   Will be on lifetime coumadin  . Recurrent pulmonary embolism ([name]) 05/03/2011   Pt has history of recurrent PE and is on chronic coumadin.  Pt has been complaining of dyspnea that she describes as chest tightness.  She has been to the ER multiple times for this.  Usually she gets CTA and serial CE.  She has had at least 6 CTA in a period of since 2011.  She also has an O2 requirement. It is unclear why pt feels dyspneic, complains of chest tightness, or has O2 requirement.  I thought that her complaint may be from deconditioning and referred her to PT.  Unfortunately Medicaid only pays for 4 PT visits.  Pt should be doing these exercises at home, but she has not.  I have referred her to [facility], and she has been seen by [name].  He did not understand the etiology of her dyspnea and O2 requirement.  He will continue to follow her.     . Seborrheic keratosis 01/18/2011  . Seizures ([name]) 02/12/2016  . Spasticity 01/05/2014  . Transient alteration of awareness, recurrent 09/08/2006   Recurrent episodes where Nyomie  loses contact with her world and that have been studied thoroughly and appear nonepileptic in nature per Dr Terrace Arabia (neurology) The patient has had episodes starting in 2007 that initially began 1-4 times a day during which time she would have periods of unawareness followed by confusion. This continuous video EEG monitoring performed from October 8-12, 2007. Had   . Urethra dilated and patulous 07/03/2015   Patulous, Dilated Urethra. 5mL balloon on a 22-Fr catheter hoping this would prevent the bladder spasm from pushing the balloon down the urethra (Dr Logan Bores, Urology [facility], July 2016)    BP 91/60   Pulse 83   LMP 04/05/2011   SpO2 95%   Opioid Risk Score:   Fall Risk Score:  `1  Depression screen PHQ 2/9  Depression screen PHQ 2/9 01/08/2016  Decreased Interest 0    Down, Depressed, Hopeless 0  PHQ - 2 Score 0  Some recent data might be hidden    Review of Systems  Constitutional: Negative.   HENT: Negative.   Eyes: Negative.   Respiratory: Negative.   Cardiovascular: Negative.   Gastrointestinal: Negative.   Endocrine: Negative.   Genitourinary: Negative.   Musculoskeletal: Negative.   Skin: Negative.   Allergic/Immunologic: Negative.   Neurological: Negative.   Hematological: Negative.   Psychiatric/Behavioral: Negative.   All other systems reviewed and are negative.      Objective:   Physical Exam  MAS 2 in R Biceps 3 in Pronator 2 in finger flexor 1 in wrist flexors  Essentially 0/5 strength on the RUE 3-/5 Left delt  Bi tri finger flexors and extensors  Speech ataxic dysarthria       Assessment & Plan:  1. Reduction in spasticity right upper extremity after botulinum toxin injection. Given minimal effect at the pronator muscles , would not repeat those particular muscles, but instead increase dosage at the biceps and brachial radialis as listed below  Biceps150 FCR50 FCU50 FDS50 FDP50  Brachiorad 50  May repeat injection on or after 05/17/2017

## 2017-04-01 NOTE — Telephone Encounter (Signed)
s/w Morrie Sheldon at [facility] at [facility] and she is going to have an IR doctor to contact [facility] before scheduling, number was given to [facility] for IR to call.

## 2017-04-01 NOTE — Telephone Encounter (Signed)
Patient called needing to know the status of her referral. Patient has cerebral palsy and is hard to understand. The number to contact patient is [phone]

## 2017-04-02 DIAGNOSIS — G809 Cerebral palsy, unspecified: Secondary | ICD-10-CM | POA: Diagnosis not present

## 2017-04-02 DIAGNOSIS — R482 Apraxia: Secondary | ICD-10-CM | POA: Diagnosis not present

## 2017-04-02 DIAGNOSIS — M24549 Contracture, unspecified hand: Secondary | ICD-10-CM | POA: Diagnosis not present

## 2017-04-02 DIAGNOSIS — R2681 Unsteadiness on feet: Secondary | ICD-10-CM | POA: Diagnosis not present

## 2017-04-02 DIAGNOSIS — R1311 Dysphagia, oral phase: Secondary | ICD-10-CM | POA: Diagnosis not present

## 2017-04-02 DIAGNOSIS — R293 Abnormal posture: Secondary | ICD-10-CM | POA: Diagnosis not present

## 2017-04-02 DIAGNOSIS — M6281 Muscle weakness (generalized): Secondary | ICD-10-CM | POA: Diagnosis not present

## 2017-04-03 ENCOUNTER — Ambulatory Visit: Payer: Medicare Other | Admitting: Physical Medicine & Rehabilitation

## 2017-04-03 DIAGNOSIS — M6281 Muscle weakness (generalized): Secondary | ICD-10-CM | POA: Diagnosis not present

## 2017-04-03 DIAGNOSIS — R2681 Unsteadiness on feet: Secondary | ICD-10-CM | POA: Diagnosis not present

## 2017-04-03 DIAGNOSIS — R293 Abnormal posture: Secondary | ICD-10-CM | POA: Diagnosis not present

## 2017-04-03 DIAGNOSIS — R482 Apraxia: Secondary | ICD-10-CM | POA: Diagnosis not present

## 2017-04-03 DIAGNOSIS — G809 Cerebral palsy, unspecified: Secondary | ICD-10-CM | POA: Diagnosis not present

## 2017-04-03 DIAGNOSIS — M24549 Contracture, unspecified hand: Secondary | ICD-10-CM | POA: Diagnosis not present

## 2017-04-04 DIAGNOSIS — R482 Apraxia: Secondary | ICD-10-CM | POA: Diagnosis not present

## 2017-04-04 DIAGNOSIS — M6281 Muscle weakness (generalized): Secondary | ICD-10-CM | POA: Diagnosis not present

## 2017-04-04 DIAGNOSIS — R293 Abnormal posture: Secondary | ICD-10-CM | POA: Diagnosis not present

## 2017-04-04 DIAGNOSIS — M24549 Contracture, unspecified hand: Secondary | ICD-10-CM | POA: Diagnosis not present

## 2017-04-04 DIAGNOSIS — R2681 Unsteadiness on feet: Secondary | ICD-10-CM | POA: Diagnosis not present

## 2017-04-04 DIAGNOSIS — G809 Cerebral palsy, unspecified: Secondary | ICD-10-CM | POA: Diagnosis not present

## 2017-04-08 DIAGNOSIS — R482 Apraxia: Secondary | ICD-10-CM | POA: Diagnosis not present

## 2017-04-08 DIAGNOSIS — M24549 Contracture, unspecified hand: Secondary | ICD-10-CM | POA: Diagnosis not present

## 2017-04-08 DIAGNOSIS — R2681 Unsteadiness on feet: Secondary | ICD-10-CM | POA: Diagnosis not present

## 2017-04-08 DIAGNOSIS — R293 Abnormal posture: Secondary | ICD-10-CM | POA: Diagnosis not present

## 2017-04-08 DIAGNOSIS — G809 Cerebral palsy, unspecified: Secondary | ICD-10-CM | POA: Diagnosis not present

## 2017-04-08 DIAGNOSIS — M6281 Muscle weakness (generalized): Secondary | ICD-10-CM | POA: Diagnosis not present

## 2017-04-09 ENCOUNTER — Other Ambulatory Visit: Payer: Self-pay | Admitting: Radiology

## 2017-04-09 DIAGNOSIS — M24549 Contracture, unspecified hand: Secondary | ICD-10-CM | POA: Diagnosis not present

## 2017-04-09 DIAGNOSIS — G809 Cerebral palsy, unspecified: Secondary | ICD-10-CM | POA: Diagnosis not present

## 2017-04-09 DIAGNOSIS — M6281 Muscle weakness (generalized): Secondary | ICD-10-CM | POA: Diagnosis not present

## 2017-04-09 DIAGNOSIS — R293 Abnormal posture: Secondary | ICD-10-CM | POA: Diagnosis not present

## 2017-04-09 DIAGNOSIS — R2681 Unsteadiness on feet: Secondary | ICD-10-CM | POA: Diagnosis not present

## 2017-04-09 DIAGNOSIS — R482 Apraxia: Secondary | ICD-10-CM | POA: Diagnosis not present

## 2017-04-10 ENCOUNTER — Ambulatory Visit (HOSPITAL_COMMUNITY)
Admission: RE | Admit: 2017-04-10 | Discharge: 2017-04-10 | Disposition: A | Discharge status: Home / Self Care | Payer: Medicare Other | Source: Ambulatory Visit | Attending: Orthopaedic Surgery | Admitting: Orthopaedic Surgery

## 2017-04-10 ENCOUNTER — Other Ambulatory Visit (INDEPENDENT_AMBULATORY_CARE_PROVIDER_SITE_OTHER): Payer: Self-pay | Admitting: Orthopaedic Surgery

## 2017-04-10 ENCOUNTER — Encounter (HOSPITAL_COMMUNITY): Payer: Self-pay

## 2017-04-10 DIAGNOSIS — M4602 Spinal enthesopathy, cervical region: Secondary | ICD-10-CM | POA: Diagnosis not present

## 2017-04-10 DIAGNOSIS — G809 Cerebral palsy, unspecified: Secondary | ICD-10-CM | POA: Diagnosis not present

## 2017-04-10 DIAGNOSIS — M542 Cervicalgia: Secondary | ICD-10-CM

## 2017-04-10 DIAGNOSIS — M50221 Other cervical disc displacement at C4-C5 level: Secondary | ICD-10-CM | POA: Diagnosis not present

## 2017-04-10 DIAGNOSIS — M47892 Other spondylosis, cervical region: Secondary | ICD-10-CM | POA: Insufficient documentation

## 2017-04-10 DIAGNOSIS — Z7901 Long term (current) use of anticoagulants: Secondary | ICD-10-CM | POA: Insufficient documentation

## 2017-04-10 LAB — BASIC METABOLIC PANEL
ANION GAP: 11 (ref 5–15)
BUN: 10 mg/dL (ref 6–20)
CALCIUM: 9.6 mg/dL (ref 8.9–10.3)
CO2: 26 mmol/L (ref 22–32)
CREATININE: 0.59 mg/dL (ref 0.44–1.00)
Chloride: 97 mmol/L — ABNORMAL LOW (ref 101–111)
GLUCOSE: 101 mg/dL — AB (ref 65–99)
Potassium: 4.4 mmol/L (ref 3.5–5.1)
Sodium: 134 mmol/L — ABNORMAL LOW (ref 135–145)

## 2017-04-10 LAB — CBC
HCT: 41.6 % (ref 36.0–46.0)
Hemoglobin: 13.8 g/dL (ref 12.0–15.0)
MCH: 30.2 pg (ref 26.0–34.0)
MCHC: 33.2 g/dL (ref 30.0–36.0)
MCV: 91 fL (ref 78.0–100.0)
PLATELETS: 274 10*3/uL (ref 150–400)
RBC: 4.57 MIL/uL (ref 3.87–5.11)
RDW: 13.1 % (ref 11.5–15.5)
WBC: 8.4 10*3/uL (ref 4.0–10.5)

## 2017-04-10 LAB — APTT: APTT: 29 s (ref 24–36)

## 2017-04-10 LAB — PROTIME-INR
INR: 1.31
Prothrombin Time: 16.3 seconds — ABNORMAL HIGH (ref 11.4–15.2)

## 2017-04-10 MED ORDER — FENTANYL CITRATE (PF) 100 MCG/2ML IJ SOLN
INTRAMUSCULAR | Status: AC | PRN
  Administered 2017-04-10 (×3): 25 ug via INTRAVENOUS

## 2017-04-10 MED ORDER — MIDAZOLAM HCL 2 MG/2ML IJ SOLN
INTRAMUSCULAR | Status: AC | PRN
  Administered 2017-04-10 (×3): 1 mg via INTRAVENOUS

## 2017-04-10 MED ORDER — FENTANYL CITRATE (PF) 100 MCG/2ML IJ SOLN
INTRAMUSCULAR | Status: AC
  Filled 2017-04-10: qty 4

## 2017-04-10 MED ORDER — SODIUM CHLORIDE 0.9 % IV SOLN: INTRAVENOUS | Status: DC

## 2017-04-10 MED ORDER — LIDOCAINE HCL (PF) 1 % IJ SOLN
5.0000 mL | Freq: Once | INTRAMUSCULAR | Status: AC
  Administered 2017-04-10: 5 mL via INTRADERMAL

## 2017-04-10 MED ORDER — ONDANSETRON HCL 4 MG/2ML IJ SOLN: 4.0000 mg | Freq: Four times a day (QID) | INTRAMUSCULAR | Status: DC | PRN

## 2017-04-10 MED ORDER — HYDROMORPHONE HCL 1 MG/ML IJ SOLN
INTRAMUSCULAR | Status: AC | PRN
  Administered 2017-04-10: 0.5 mg via INTRAVENOUS

## 2017-04-10 MED ORDER — IOPAMIDOL (ISOVUE-M 300) INJECTION 61%
15.0000 mL | Freq: Once | INTRAMUSCULAR | Status: AC | PRN
  Administered 2017-04-10: 11 mL via INTRATHECAL

## 2017-04-10 MED ORDER — HYDROMORPHONE HCL 1 MG/ML IJ SOLN
INTRAMUSCULAR | Status: AC
  Filled 2017-04-10: qty 1

## 2017-04-10 MED ORDER — MIDAZOLAM HCL 2 MG/2ML IJ SOLN
INTRAMUSCULAR | Status: AC
  Filled 2017-04-10: qty 8

## 2017-04-10 MED ORDER — IOPAMIDOL (ISOVUE-M 300) INJECTION 61%
INTRAMUSCULAR | Status: AC
  Administered 2017-04-10: 11 mL via INTRATHECAL
  Filled 2017-04-10: qty 15

## 2017-04-10 MED ORDER — IOPAMIDOL (ISOVUE-300) INJECTION 61%
INTRAVENOUS | Status: AC
  Filled 2017-04-10: qty 50

## 2017-04-10 MED ORDER — LIDOCAINE HCL 1 % IJ SOLN
INTRAMUSCULAR | Status: AC
  Filled 2017-04-10: qty 20

## 2017-04-10 NOTE — Sedation Documentation (Signed)
CT unable to accommodate will wait at nurses station.

## 2017-04-10 NOTE — Sedation Documentation (Signed)
Will transport to CT on bed for post imaging.

## 2017-04-10 NOTE — H&P (Signed)
Chief Complaint: neck pain  Referring Physician:Dr. Rodell Perna  Supervising Physician: Arne Cleveland  Patient Status: [facility] - Out-pt  HPI: [name] [name] is a 41 y.o. female with a history of CP who is having neck due to right-side leaning preference.  She has been evaluated by Dr. Lorin Mercy.  An MRI was wanted; however, she has a baclofen pump and is unable to get an MRI.  Therefore a request for a cervical myelogram has been ordered.  Given positioning issues and overall medical state, the patient will require sedation medications for this procedure.  She does take coumadin at home secondary to recurrent PEs.  She last took her coumadin on 04-05-17.  INR is 1.3 today.  Past Medical History:  Past Medical History:  Diagnosis Date  . Abdominal pain 01/02/2012   Overview:  Overview:  Per Previous pcp note- Dr. Ta--Pt has history of endometriosis and had DUB in 2011.  She She had u/s that showed normal endometrial stripe.  She had endometrial bx that was wnl.  She has a lot of abd cramping every month.  This cramping was sometimes not relieved with hydrocodone.  She was referred to [id number] for IUD placement to help endometriosis and abd cramping.  [name]  . Abdominal pain 01/02/2012   Overview:  Overview:  Per Previous pcp note- Dr. Ta--Pt has history of endometriosis and had DUB in 2011.  She She had u/s that showed normal endometrial stripe.  She had endometrial bx that was wnl.  She has a lot of abd cramping every month.  This cramping was sometimes not relieved with hydrocodone.  She was referred to [facility] for IUD placement to help endometriosis and abd cramping.  [name] was placed 04/30/11 by [facility].  Pt still complains of abd cramping, which I am treating with hydrocodone and ultram. Pt reports some initial relief of pain from IUD, but states it has now returned back to similar to previous cramping. Gave patient a shot of Toradol 60 mg IM today-since positive exacerbation of abdominal cramping  during past 2 weeks. Physical exam reassuring. Patient may need to return to [place] for further workup since she is plugged in with the GYN providers there for further treatment options regarding endometriosis and abdominal cramping. The dysfunctional uterine bleeding now well contr  . Acute cystitis with hematuria   . Acute cystitis without hematuria   . Acute respiratory failure with hypoxia ([place])   . Anemia   . Cellulitis 12/22/2014  . Cerebral palsy ([name])    Spastic Cerebral palsy, mentally intact  . Contracture, joint, multiple sites    Electric wheelchair, uses left hand to operate chair.   . Depression   . Disease of female genital organs 10/24/2010   Overview:  Overview:  Pt has history of endometriosis and had DUB in 2011.  She had u/s that showed normal endometrial stripe.  She had endometrial bx that was wnl.  She has a lot of abd cramping every month.  This cramping was sometimes not relieved with hydrocodone.  She was referred to [facility] for IUD placement to help endometriosis and abd cramping.  [name] was placed 04/30/11 by [facility].  Pt still complains of abd cramping, which was treated with hydrocodone and ultram.   Last seen at [facility] by OB/GYN 12/24/11, given Depo Lupron injection.  Ordering CT scan to r/o additional cause of abdominal pain (pt with known endometriosis and will not tolerate vaginal U/S, pain not improved s/p [name] or with  Lupron injection-- only caused hot flashes and amenorrhea, no pain relief).  Also made referral to pain clinic for better pain control.  Do not think hysterectomy would help with pain, and concern for contamination of baclofen pump if did proceed with surgery.    8/5/13[name]: Pt was seen by   . [name]   . Dysarthria   . Dyspnea and respiratory abnormality 02/04/2011   Overview:  Overview:  Pt has history of recurrent PE and is on chronic coumadin.  Pt has been complaining of dyspnea that she describes as  chest tightness.  She has been to the ER multiple times for this.  Usually she gets CTA and serial CE.  She has had at least 6 CTA in a period of since 2011.  She also has an O2 requirement. It is unclear why pt feels dyspneic, complains of chest tightness, or has O2 requirement.  I thought that her complaint may be from deconditioning and referred her to PT.  Unfortunately Medicaid only pays for 4 PT visits.  Pt should be doing these exercises at home, but she has not.  I have referred her to [facility], and she has been seen by Dr Chase Caller.  He did not understand the etiology of her dyspnea and O2 requirement.  He will continue to follow her.   Last Assessment & Plan:  Pt presents with concerns for chest tightness and dyspnea.  She was seen in ED yesterday and discharged home.  While there she had a CT chest 04/20/11 showed 1.  Multifocal degradation as detailed above.  N  . Endometriosis   . GERD (gastroesophageal reflux disease)   . Gram positive sepsis ([place]) 10/24/2014  . Gross hematuria 03/12/2013   Overview:  Overview:  status post replacement of suprapubic tube 03/04/2013  Last Assessment & Plan:  Recent urine culture collected in ED on 5/4 did not result in significant growth. Moreover, patient has been asymptomatic with normal WBC, no fever and no true symptoms making symptomatic UTI less likely.  Will continue to monitor for any sign of infection.  Patient's xarelto had been held during her recent hospitalization due to hematuria. This has since been re-started. Monitor bleeding.    [name] Kitchen HCAP (healthcare-associated pneumonia) 10/25/2014  . History of anticoagulant therapy 10/21/2012  . History of endometriosis 10/2012   OBGYN [facility]: Laparoscopic Endometrial Ablation, TVH,  . History of recurrent UTIs   . Hypertension   . HYPERTENSION, BENIGN 01/31/2010   Qualifier: Diagnosis of  By: [name], MD, [name] Incontinent of feces 03/11/2016  . Infantile cerebral palsy ([place]) 01/29/2007   Overview:   She has spastic CP.  She is mentally intact.  She is wheelchair bound.  She is wheelchair bound for ambulation      . Intracervical pessary 05/07/2011   Overview:  Overview:  Placed by [facility] 04/30/11 to treat DUB and Endometriosis.  She is seen at GYN clinic at [facility] but was considered a poor surgical candidate and referred her to [facility].  For DUB she had pelvic ultrasound that showed thin stripe and she had endometrial Bx by Dr Nori Riis in [place] clinic, which was negative.     . Macroscopic hematuria 03/10/2013   status post replacement of suprapubic tube 03/04/2013   . Migraine   . OCD (obsessive compulsive disorder)   . Parapneumonic effusion 10/25/2014  . Presence of intrathecal baclofen pump 03/07/2014   R LQ. Insertion T10.    [name] Kitchen  Psychiatric illness 10/31/2014  . Pulmonary embolism ([name])    Lifetime Coumadin  . Pulmonary embolism ([place]) 2011, 01/2011   Will be on lifetime coumadin  . Recurrent pulmonary embolism ([place]) 05/03/2011   Pt has history of recurrent PE and is on chronic coumadin.  Pt has been complaining of dyspnea that she describes as chest tightness.  She has been to the ER multiple times for this.  Usually she gets CTA and serial CE.  She has had at least 6 CTA in a period of since 2011.  She also has an O2 requirement. It is unclear why pt feels dyspneic, complains of chest tightness, or has O2 requirement.  I thought that her complaint may be from deconditioning and referred her to PT.  Unfortunately Medicaid only pays for 4 PT visits.  Pt should be doing these exercises at home, but she has not.  I have referred her to [facility], and she has been seen by Dr Chase Caller.  He did not understand the etiology of her dyspnea and O2 requirement.  He will continue to follow her.     . Seborrheic keratosis 01/18/2011  . Seizures ([place]) 02/12/2016  . Spasticity 01/05/2014  . Transient alteration of awareness, recurrent 09/08/2006   Recurrent episodes where Maylynn  loses contact with her world and that  have been studied thoroughly and appear nonepileptic in nature per Dr Krista Blue (neurology) The patient has had episodes starting in 2007 that initially began 1-4 times a day during which time she would have periods of unawareness followed by confusion. This continuous video EEG monitoring performed from October 8-12, 2007. Had   . Urethra dilated and patulous 07/03/2015   Patulous, Dilated Urethra. 9m balloon on a 22-Fr catheter hoping this would prevent the bladder spasm from pushing the balloon down the urethra (Dr EAmalia Hailey [facility], July 2016)     Past Surgical History:  Past Surgical History:  Procedure Laterality Date  . ABDOMINAL HYSTERECTOMY    . APPENDECTOMY    . BACLOFEN PUMP REFILL     x 3 times  . CARPAL TUNNEL RELEASE  08/2008   Dr SDaylene Katayama . CESAREAN SECTION     x 2  . CHOLECYSTECTOMY  10/14/2015   Procedure: LAPAROSCOPIC CHOLECYSTECTOMY;  Surgeon: BGeorganna Skeans MD;  [place]  Service: General;;  . COLPOSCOPY  06/2000  . INTRAUTERINE DEVICE INSERTION  04/30/11   Inserted by [place] endometriosis  . laparoscopically assisted Vag Hysterectomy  10/27/2012  . MULTIPLE EXTRACTIONS WITH ALVEOLOPLASTY Bilateral 07/19/2013   Procedure: EXTRACTIONS #4, 55,02,77,41  Surgeon: CIsac Caddy DDS;  [place]  Service: Oral Surgery;  Laterality: Bilateral;  . NEUROMA SURGERY Left    anterior and posterior intersosseous  . PAIN PUMP IMPLANTATION N/A 03/07/2014   Procedure: baclofen pump revision/replacement and Catheter connection replacement;  Surgeon: JOphelia Charter MD;  [facility];  Service: Neurosurgery;  Laterality: N/A;  baclofen pump revision/replacement and Catheter connection replacement  . PROGRAMABLE BACLOFEN PUMP REVISION  03/17/14   Battery Replacement   . TUBAL LIGATION  2003  . URETHRA SURGERY    . WRIST SURGERY  06/2010   Dr LNicoletta Dress hand surgeon, [facility]   Family History:  Family History  Problem Relation Age of Onset  .  Asthma Father   . Colon cancer Maternal Grandmother        Died in her 624's . Breast cancer Paternal Grandmother  Died in her 7's  . Heart attack Maternal Grandfather        Died in his 75's  . Alzheimer's disease Paternal Grandfather        Died in his 7's  . Stomach cancer Neg Hx     Social History:  reports that she has never smoked. She has never used smokeless tobacco. She reports that she drinks about 0.6 oz of alcohol per week . She reports that she does not use drugs.  Allergies:  Allergies  Allergen Reactions  . Morphine Dermatitis and Other (See Comments)    Skin turned red    Medications: Medications reviewed in epic  Please HPI for pertinent positives, otherwise complete 10 system ROS negative, except a headache today.  She is incontinent of her bowels and has a suprapubic catheter in place for her urine..  Mallampati Score: MD Evaluation Airway: WNL Heart: WNL Abdomen: Other (comments) Abdomen comments: baclofen pump in RLQ, suprapubic catheter in place Chest/ Lungs: WNL ASA  Classification: 3 Mallampati/Airway Score: Two  Physical Exam: BP (!) 120/53   Pulse 82   Temp 97.5 F (36.4 C)   Resp 18   Ht '4\' 9"'$  (1.448 m)   Wt 98 lb (44.5 kg)   LMP 04/05/2011   SpO2 95%   BMI 21.21 kg/m  Body mass index is 21.21 kg/m. General: pleasant, skinny white female who is laying in bed in NAD, but with some contractures.  HEENT: head is normocephalic, atraumatic.  Sclera are noninjected.  PERRL.  Ears and nose without any masses or lesions.  Mouth is pink and moist.  Neck with right leaning preference Heart: regular, rate, and rhythm.  Normal s1,s2. No obvious murmurs, gallops, or rubs noted.  Palpable radial pulses bilaterally Lungs: CTAB, no wheezes, rhonchi, or rales noted.  Respiratory effort nonlabored Abd: soft, NT, ND, +BS, no masses, hernias, or organomegaly.  Baclofen pump in RLQ.  Suprapubic catheter in place as well. MS: upper extremities,  particularly her hands with some contractures.  No cyanosis, clubbing, or edema of any extremities. Psych: A&Ox3 with an appropriate affect.   Labs: Results for orders placed or performed during the hospital encounter of 04/10/17 (from the past 48 hour(s))  APTT     Status: None   Collection Time: 04/10/17  7:53 AM  Result Value Ref Range   aPTT 29 24 - 36 seconds  Basic metabolic panel     Status: Abnormal   Collection Time: 04/10/17  7:53 AM  Result Value Ref Range   Sodium 134 (L) 135 - 145 mmol/L   Potassium 4.4 3.5 - 5.1 mmol/L   Chloride 97 (L) 101 - 111 mmol/L   CO2 26 22 - 32 mmol/L   Glucose, Bld 101 (H) 65 - 99 mg/dL   BUN 10 6 - 20 mg/dL   Creatinine, Ser 0.59 0.44 - 1.00 mg/dL   Calcium 9.6 8.9 - 10.3 mg/dL   GFR calc non Af Amer >60 >60 mL/min   GFR calc Af Amer >60 >60 mL/min    Comment: (NOTE) The eGFR has been calculated using the CKD EPI equation. This calculation has not been validated in all clinical situations. eGFR's persistently <60 mL/min signify possible Chronic Kidney Disease.    Anion gap 11 5 - 15  CBC     Status: None   Collection Time: 04/10/17  7:53 AM  Result Value Ref Range   WBC 8.4 4.0 - 10.5 K/uL   RBC 4.57 3.87 - 5.11  MIL/uL   Hemoglobin 13.8 12.0 - 15.0 g/dL   HCT 41.6 36.0 - 46.0 %   MCV 91.0 78.0 - 100.0 fL   MCH 30.2 26.0 - 34.0 pg   MCHC 33.2 30.0 - 36.0 g/dL   RDW 13.1 11.5 - 15.5 %   Platelets 274 150 - 400 K/uL  Protime-INR     Status: Abnormal   Collection Time: 04/10/17  7:53 AM  Result Value Ref Range   Prothrombin Time 16.3 (H) 11.4 - 15.2 seconds   INR 1.31    *Note: Due to a large number of results and/or encounters for the requested time period, some results have not been displayed. A complete set of results can be found in Results Review.    Imaging: No results found.  Assessment/Plan 1. Neck pain We will plan to proceed today with a cervical myelogram under sedation.  Her INR is 1.3 and good to proceed.  Her  other labs and vitals have been reviewed. The procedure including risk and complications were discussed with the patient.  She understands and is agreeable to proceed with the procedure.  Her consent has been signed and is in the chart.  Thank you for this interesting consult.  I greatly enjoyed meeting [name] [name] and look forward to participating in their care.  A copy of this report was sent to the requesting provider on this date.  Electronically Signed: Henreitta Cea 04/10/2017, 8:37 AM   I spent a total of  30 Minutes   in face to face in clinical consultation, greater than 50% of which was counseling/coordinating care for neck pain

## 2017-04-10 NOTE — Procedures (Signed)
Cervical myelogram  No complication No blood loss. See [facility] dictation in [facility].

## 2017-04-10 NOTE — Discharge Instructions (Signed)
Myelogram and Lumbar Puncture Discharge Instructions  1. Go home and rest quietly for the next 24 hours.  It is important to lie flat for the next 24 hours.  Get up only to go to the restroom.  You may lie in the bed or on a couch on your back, your stomach, your left side or your right side.  You may have one pillow under your head.  You may have pillows between your knees while you are on your side or under your knees while you are on your back.  2. DO NOT drive today.  Recline the seat as far back as it will go, while still wearing your seat belt, on the way home.  3. You may get up to go to the bathroom as needed.  You may sit up for 10 minutes to eat.  You may resume your normal diet and medications unless otherwise indicated.  4. The incidence of headache, nausea, or vomiting is about 5% (one in 20 patients).  If you develop a headache, lie flat and drink plenty of fluids until the headache goes away.  Caffeinated beverages may be helpful.  If you develop severe nausea and vomiting or a headache that does not go away with flat bed rest, call .  5. You may resume normal activities after your 24 hours of bed rest is over; however, do not exert yourself strongly or do any heavy lifting tomorrow.  6. Call your physician for a follow-up appointment.  The results of your myelogram will be sent directly to your physician by the following day.  7. If you have any questions or if complications develop after you arrive home, please call .  Discharge instructions have been explained to the patient.  The patient, or the person responsible for the patient, fully understands these instructions.  Moderate Conscious Sedation, Adult, Care After These instructions provide you with information about caring for yourself after your procedure. Your health care provider may also give you more specific instructions. Your treatment has been planned according to current medical practices, but problems sometimes  occur. Call your health care provider if you have any problems or questions after your procedure. What can I expect after the procedure? After your procedure, it is common:  To feel sleepy for several hours.  To feel clumsy and have poor balance for several hours.  To have poor judgment for several hours.  To vomit if you eat too soon. Follow these instructions at home: For at least 24 hours after the procedure:    Do not:  Participate in activities where you could fall or become injured.  Drive.  Use heavy machinery.  Drink alcohol.  Take sleeping pills or medicines that cause drowsiness.  Make important decisions or sign legal documents.  Take care of children on your own.  Rest. Eating and drinking   Follow the diet recommended by your health care provider.  If you vomit:  Drink water, juice, or soup when you can drink without vomiting.  Make sure you have little or no nausea before eating solid foods. General instructions   Have a responsible adult stay with you until you are awake and alert.  Take over-the-counter and prescription medicines only as told by your health care provider.  If you smoke, do not smoke without supervision.  Keep all follow-up visits as told by your health care provider. This is important. Contact a health care provider if:  You keep feeling nauseous or you keep vomiting.  You feel light-headed.  You develop a rash.  You have a fever. Get help right away if:  You have trouble breathing. This information is not intended to replace advice given to you by your health care provider. Make sure you discuss any questions you have with your health care provider. Document Released: 09/08/2013 Document Revised: 04/22/2016 Document Reviewed: 03/09/2016 Elsevier Interactive Patient Education  2017 [employer]

## 2017-04-11 ENCOUNTER — Other Ambulatory Visit (HOSPITAL_COMMUNITY): Payer: Medicare Other

## 2017-04-11 ENCOUNTER — Telehealth (INDEPENDENT_AMBULATORY_CARE_PROVIDER_SITE_OTHER): Payer: Self-pay | Admitting: Orthopaedic Surgery

## 2017-04-11 DIAGNOSIS — R482 Apraxia: Secondary | ICD-10-CM | POA: Diagnosis not present

## 2017-04-11 DIAGNOSIS — R293 Abnormal posture: Secondary | ICD-10-CM | POA: Diagnosis not present

## 2017-04-11 DIAGNOSIS — M24549 Contracture, unspecified hand: Secondary | ICD-10-CM | POA: Diagnosis not present

## 2017-04-11 DIAGNOSIS — R2681 Unsteadiness on feet: Secondary | ICD-10-CM | POA: Diagnosis not present

## 2017-04-11 DIAGNOSIS — M6281 Muscle weakness (generalized): Secondary | ICD-10-CM | POA: Diagnosis not present

## 2017-04-11 DIAGNOSIS — G809 Cerebral palsy, unspecified: Secondary | ICD-10-CM | POA: Diagnosis not present

## 2017-04-11 NOTE — Telephone Encounter (Signed)
Patient called in this afternoon in regards to her medication. She stated that Dr. Ophelia CharterYates took her off of her Seroquel? She wanted to know why because she needs to be on this medication. Her CB # [phone]. Thank you

## 2017-04-11 NOTE — Telephone Encounter (Signed)
I spoke with patient and she states Dr. Ophelia CharterYates took her off of her Seroquel and Prozac and that she needs them. She feels like she is going crazy. She is extremely upset.   [name] MaxwellCheryl spoke with [name] [name] at [facility] who is taking care of patient. She states per order for CT Myelo with sedation, patient was to hold Seroquel, Prozac, and Coumadin from 5/6-5/13.  [name] DelErin Zamora, NP spoke with Adair LaundryLatonya and gave verbal okay for patient to have Seroquel and Prozac restarted.

## 2017-04-14 ENCOUNTER — Encounter (INDEPENDENT_AMBULATORY_CARE_PROVIDER_SITE_OTHER): Payer: Medicare Other | Admitting: Pediatrics

## 2017-04-14 DIAGNOSIS — R293 Abnormal posture: Secondary | ICD-10-CM | POA: Diagnosis not present

## 2017-04-14 DIAGNOSIS — R2681 Unsteadiness on feet: Secondary | ICD-10-CM | POA: Diagnosis not present

## 2017-04-14 DIAGNOSIS — M6281 Muscle weakness (generalized): Secondary | ICD-10-CM | POA: Diagnosis not present

## 2017-04-14 DIAGNOSIS — G809 Cerebral palsy, unspecified: Secondary | ICD-10-CM | POA: Diagnosis not present

## 2017-04-14 DIAGNOSIS — M24549 Contracture, unspecified hand: Secondary | ICD-10-CM | POA: Diagnosis not present

## 2017-04-14 DIAGNOSIS — R482 Apraxia: Secondary | ICD-10-CM | POA: Diagnosis not present

## 2017-04-15 ENCOUNTER — Encounter (INDEPENDENT_AMBULATORY_CARE_PROVIDER_SITE_OTHER): Payer: Self-pay | Admitting: Pediatrics

## 2017-04-15 ENCOUNTER — Ambulatory Visit (INDEPENDENT_AMBULATORY_CARE_PROVIDER_SITE_OTHER): Payer: Medicare Other | Admitting: Pediatrics

## 2017-04-15 ENCOUNTER — Telehealth (INDEPENDENT_AMBULATORY_CARE_PROVIDER_SITE_OTHER): Payer: Self-pay

## 2017-04-15 ENCOUNTER — Telehealth (INDEPENDENT_AMBULATORY_CARE_PROVIDER_SITE_OTHER): Payer: Self-pay | Admitting: Orthopaedic Surgery

## 2017-04-15 DIAGNOSIS — R293 Abnormal posture: Secondary | ICD-10-CM | POA: Diagnosis not present

## 2017-04-15 DIAGNOSIS — G803 Athetoid cerebral palsy: Secondary | ICD-10-CM

## 2017-04-15 DIAGNOSIS — G809 Cerebral palsy, unspecified: Secondary | ICD-10-CM | POA: Diagnosis not present

## 2017-04-15 DIAGNOSIS — M24549 Contracture, unspecified hand: Secondary | ICD-10-CM | POA: Diagnosis not present

## 2017-04-15 DIAGNOSIS — G808 Other cerebral palsy: Secondary | ICD-10-CM | POA: Diagnosis not present

## 2017-04-15 DIAGNOSIS — M47812 Spondylosis without myelopathy or radiculopathy, cervical region: Secondary | ICD-10-CM

## 2017-04-15 DIAGNOSIS — R482 Apraxia: Secondary | ICD-10-CM | POA: Diagnosis not present

## 2017-04-15 DIAGNOSIS — R2681 Unsteadiness on feet: Secondary | ICD-10-CM | POA: Diagnosis not present

## 2017-04-15 DIAGNOSIS — M6281 Muscle weakness (generalized): Secondary | ICD-10-CM | POA: Diagnosis not present

## 2017-04-15 NOTE — Addendum Note (Signed)
Addended by: Rogers SeedsYEATTS, Irisa Grimsley M on: 04/15/2017 04:38 PM   Modules accepted: Orders

## 2017-04-15 NOTE — Progress Notes (Signed)
Patient: [name] [name] MRN: [id number] Sex: female DOB: [birth year]  Provider: Ellison Carwin, MD Location of Care: [facility]  Note type: Routine return visit  History of Present Illness: Referral Source: Melanie March, MD History from: patient and [facility] chart Chief Complaint: Spastic/Athetoid Quadriparesis  [name] [name] is a 41 y.o. female who returns Apr 15, 2017 to empty refill and reprogram her intrathecal baclofen pump.  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  We have gradually increased her baclofen in response to complaints of decreased mobility and spasticity.  She has responded in being able to independently care for herself, to get in and out of her wheelchair with less assistance, to dress herself and feed herself more independently.  She has occasional "blackouts" that appear to be non-epileptic.  These tend to correlate with times when she is stressed or depressed.  She did not mention this today.  She is unhappy in the rest home where she lives.  She wants to live independently that this is both logistically financially difficult.  Based on what she tells me, it appears to me that she's bisexual because she's had both romantic interest in women and men.  She has recently broken up with a girlfriend who lives outside the home.  In general she has a long-standing history of depression.  Procedure: Reprogramming intrathecal baclofen pump  The device was interrogated and showed a complex continuous infusion of baclofen that has a basal rate of 18.37mcg per hour. The patient receives bolus doses of 27 mcg delivered at midnight, 6 AM, 12 noon, and 6 PM, for total of 494.1 mcg per day.  The patient was sterilely prepped and draped. A 1-1/2 inch 22-gauge noncoring Huebner needle was inserted on the first pass. The apex of the reservoir is going horizontally to the left. 2.5 mL was withdrawn from the pump placing it under  partial vacuum. 40 mL of baclofen (concentration 2000 mcg/mL) was instilled into the pump through a Millipore filter. She tolerated the procedure well.  She was one daily for this visit and apparently the low reservoir alarm had been triggered.  I had reset it in order to reprogram the device.  Her reservoir alarm date is September 19 2017, or 160 days from now. ERI 43 months.  Review of Systems: I did not review her other medical conditions today.  Past Medical History Diagnosis Date  . Abdominal pain 01/02/2012   Overview:  Overview:  Per Previous pcp note- Dr. Ta--Pt has history of endometriosis and had DUB in 2011.  She She had u/s that showed normal endometrial stripe.  She had endometrial bx that was wnl.  She has a lot of abd cramping every month.  This cramping was sometimes not relieved with hydrocodone.  She was referred to [facility] for IUD placement to help endometriosis and abd cramping.  [name]  . Abdominal pain 01/02/2012   Overview:  Overview:  Per Previous pcp note- Dr. Ta--Pt has history of endometriosis and had DUB in 2011.  She She had u/s that showed normal endometrial stripe.  She had endometrial bx that was wnl.  She has a lot of abd cramping every month.  This cramping was sometimes not relieved with hydrocodone.  She was referred to [facility] for IUD placement to help endometriosis and abd cramping.  [name] was placed 04/30/11 by [facility].  Pt still complains of abd cramping, which I am treating with hydrocodone and ultram. Pt reports some initial relief  of pain from IUD, but states it has now returned back to similar to previous cramping. Gave patient a shot of Toradol 60 mg IM today-since positive exacerbation of abdominal cramping during past 2 weeks. Physical exam reassuring. Patient may need to return to [facility] for further workup since she is plugged in with the GYN providers there for further treatment options regarding endometriosis and abdominal cramping. The  dysfunctional uterine bleeding now well contr  . Acute cystitis with hematuria   . Acute cystitis without hematuria   . Acute respiratory failure with hypoxia ([name])   . Anemia   . Cellulitis 12/22/2014  . Cerebral palsy ([name])    Spastic Cerebral palsy, mentally intact  . Contracture, joint, multiple sites    Electric wheelchair, uses left hand to operate chair.   . Depression   . Disease of female genital organs 10/24/2010   Overview:  Overview:  Pt has history of endometriosis and had DUB in 2011.  She had u/s that showed normal endometrial stripe.  She had endometrial bx that was wnl.  She has a lot of abd cramping every month.  This cramping was sometimes not relieved with hydrocodone.  She was referred to [facility] for IUD placement to help endometriosis and abd cramping.  [name] was placed 04/30/11 by [facility].  Pt still complains of abd cramping, which was treated with hydrocodone and ultram.   Last seen at [facility] by OB/GYN 12/24/11, given Depo Lupron injection.  Ordering CT scan to r/o additional cause of abdominal pain (pt with known endometriosis and will not tolerate vaginal U/S, pain not improved s/p [name] or with Lupron injection-- only caused hot flashes and amenorrhea, no pain relief).  Also made referral to pain clinic for better pain control.  Do not think hysterectomy would help with pain, and concern for contamination of baclofen pump if did proceed with surgery.    8/5/13[name] OB/GYN: Pt was seen by   . [name]   . Dysarthria   . Dyspnea and respiratory abnormality 02/04/2011   Overview:  Overview:  Pt has history of recurrent PE and is on chronic coumadin.  Pt has been complaining of dyspnea that she describes as chest tightness.  She has been to the ER multiple times for this.  Usually she gets CTA and serial CE.  She has had at least 6 CTA in a period of since 2011.  She also has an O2 requirement. It is unclear why pt feels dyspneic, complains of  chest tightness, or has O2 requirement.  I thought that her complaint may be from deconditioning and referred her to PT.  Unfortunately Medicaid only pays for 4 PT visits.  Pt should be doing these exercises at home, but she has not.  I have referred her to [facility], and she has been seen by [name].  He did not understand the etiology of her dyspnea and O2 requirement.  He will continue to follow her.   Last Assessment & Plan:  Pt presents with concerns for chest tightness and dyspnea.  She was seen in ED yesterday and discharged home.  While there she had a CT chest 04/20/11 showed 1.  Multifocal degradation as detailed above.  N  . Endometriosis   . GERD (gastroesophageal reflux disease)   . Gram positive sepsis ([name]) 10/24/2014  . Gross hematuria 03/12/2013   Overview:  Overview:  status post replacement of suprapubic tube 03/04/2013  Last Assessment & Plan:  Recent urine culture  collected in ED on 5/4 did not result in significant growth. Moreover, patient has been asymptomatic with normal WBC, no fever and no true symptoms making symptomatic UTI less likely.  Will continue to monitor for any sign of infection.  Patient's xarelto had been held during her recent hospitalization due to hematuria. This has since been re-started. Monitor bleeding.    [name] Kitchen. HCAP (healthcare-associated pneumonia) 10/25/2014  . History of anticoagulant therapy 10/21/2012  . History of endometriosis 10/2012   OBGYN [facility]: Laparoscopic Endometrial Ablation, TVH,  . History of recurrent UTIs   . Hypertension   . HYPERTENSION, BENIGN 01/31/2010   Qualifier: Diagnosis of  By: Gala RomneyBensimhon, MD, Trixie DredgeFACC, Daniel R   . Incontinent of feces 03/11/2016  . Infantile cerebral palsy ([name]) 01/29/2007   Overview:  She has spastic CP.  She is mentally intact.  She is wheelchair bound.  She is wheelchair bound for ambulation      . Intracervical pessary 05/07/2011   Overview:  Overview:  [facility]/11 to treat DUB and  Endometriosis.  [facility] but was considered a poor surgical candidate and [facility]  For DUB she had pelvic ultrasound that showed thin stripe and she had endometrial Bx by Dr Jennette KettleNeal in GYN clinic, which was negative.     . Macroscopic hematuria 03/10/2013   status post replacement of suprapubic tube 03/04/2013   . Migraine   . OCD (obsessive compulsive disorder)   . Parapneumonic effusion 10/25/2014  . Presence of intrathecal baclofen pump 03/07/2014   R LQ. Insertion T10.    [name] Kitchen. Psychiatric illness 10/31/2014  . Pulmonary embolism ([name])    Lifetime Coumadin  . Pulmonary embolism ([name]) 2011, 01/2011   Will be on lifetime coumadin  . Recurrent pulmonary embolism ([name]) 05/03/2011   Pt has history of recurrent PE and is on chronic coumadin.  Pt has been complaining of dyspnea that she describes as chest tightness.  She has been to the ER multiple times for this.  Usually she gets CTA and serial CE.  She has had at least 6 CTA in a period of since 2011.  She also has an O2 requirement. It is unclear why pt feels dyspneic, complains of chest tightness, or has O2 requirement.  I thought that her complaint may be from deconditioning and referred her to PT.  Unfortunately Medicaid only pays for 4 PT visits.  Pt should be doing these exercises at home, but she has not.  I have referred her to [facility], and she has been seen by Dr Marchelle Gearingamaswamy.  He did not understand the etiology of her dyspnea and O2 requirement.  He will continue to follow her.     . Seborrheic keratosis 01/18/2011  . Seizures ([name]) 02/12/2016  . Spasticity 01/05/2014  . Transient alteration of awareness, recurrent 09/08/2006   Recurrent episodes where [name]  loses contact with her world and that have been studied thoroughly and appear nonepileptic in nature per Dr Terrace ArabiaYan (neurology) The patient has had episodes starting in 2007 that initially began 1-4 times a day during which time she would have periods of unawareness  followed by confusion. This continuous video EEG monitoring performed from October 8-12, 2007. Had   . Urethra dilated and patulous 07/03/2015   Patulous, Dilated Urethra. 5mL balloon on a 22-Fr catheter hoping this would prevent the bladder spasm from pushing the balloon down the urethra (Dr Logan BoresEvans, Urology [facility], July 2016)  Hospitalizations: Yes.  , Head Injury: No., Nervous System Infections: No., Immunizations up to date: Yes.    Birth History She was extremely premature infant which is the cause of her motor disorder.  Behavior History anxiety, depression, non-epileptic events  Surgical History Procedure Laterality Date  . ABDOMINAL HYSTERECTOMY    . APPENDECTOMY    . BACLOFEN PUMP REFILL     x 3 times  . CARPAL TUNNEL RELEASE  08/2008   Dr Teressa Senter  . CESAREAN SECTION     x 2  . CHOLECYSTECTOMY  10/14/2015   Procedure: LAPAROSCOPIC CHOLECYSTECTOMY;  Surgeon: Violeta Gelinas, MD;  Location: [facility] OR;  Service: General;;  . COLPOSCOPY  06/2000  . INTRAUTERINE DEVICE INSERTION  04/30/11   Inserted by [facility] GYN for endometriosis  . laparoscopically assisted Vag Hysterectomy  10/27/2012  . MULTIPLE EXTRACTIONS WITH ALVEOLOPLASTY Bilateral 07/19/2013   Procedure: EXTRACTIONS #4, 5,40,98,11;  Surgeon: Francene Finders, DDS;  Location: [facility] OR;  Service: Oral Surgery;  Laterality: Bilateral;  . NEUROMA SURGERY Left    anterior and posterior intersosseous  . PAIN PUMP IMPLANTATION N/A 03/07/2014   Procedure: baclofen pump revision/replacement and Catheter connection replacement;  Surgeon: Cristi Loron, MD;  Location: [facility];  Service: Neurosurgery;  Laterality: N/A;  baclofen pump revision/replacement and Catheter connection replacement  . PROGRAMABLE BACLOFEN PUMP REVISION  03/17/14   Battery Replacement   . TUBAL LIGATION  2003  . URETHRA SURGERY    . WRIST SURGERY  06/2010   Dr Dierdre Searles, hand surgeon, [name]   Family History family history includes Alzheimer's disease in  her paternal grandfather; Asthma in her father; Breast cancer in her paternal grandmother; Colon cancer in her maternal grandmother; Heart attack in her maternal grandfather. Family history is negative for migraines, seizures, intellectual disabilities, blindness, deafness, birth defects, chromosomal disorder, or autism.  Social History . Marital status: Divorced    Spouse name: N/A  . Number of children: 2  . Years of education: college   Occupational History  .  Unemployed   Social History Main Topics  . Smoking status: Never Smoker  . Smokeless tobacco: Never Used  . Alcohol use 0.6 oz/week    1 Glasses of wine per week     Comment: Drinks alcohol once a month.  . Drug use: No  . Sexual activity: No   Other Topics Concern  . 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    She enjoys shopping.   Lives in a nursing facility[facility] since 02/11/16      Ailene Ards, who has schizophrenia, MR, is father of daughter.  Homero Fellers and pt are no longer together.  Homero Fellers does not see [name]. Daughter is 32 yo, Stage manager, lives with pt.        [name] [phone]), lives with pt's parents.      Needs assistance with ADL's and IADL's--has personal care services 20 hrs/wk;       No tobacco, EtOH, drugs.      **Patient very concerned about her kids being taken from her.      **Case Manager: Jack Quarto [phone]      Patient lives [facility]   Her mother is ill, unable to take care of herself and has been placed in a chronic care facility.  Allergies Allergen Reactions  . Morphine Dermatitis and Other (See Comments)    Skin turned red  Physical Exam LMP 04/05/2011   I did not examine her today.  Assessment 1.  Cerebral palsy, quadriplegic, G80.8. 2.  Athetoid cerebral palsy, G 80.3.  Discussion [name] has made very good progress over the past several months in terms of  increasing mobility and increased independence in activities of daily living.  There is no reason to change her infusion rates today.  She tolerated her emptying, reprogramming, and refilling her intrathecal baclofen pump.  Plan She will return on or before September 19, 2017 to empty refill and reprogram her intrathecal baclofen pump.  We'll see her sooner as needed.   Medication List   Accurate as of 04/15/17 10:30 AM.      acetaminophen 325 MG tablet Commonly known as:  TYLENOL Take 650 mg by mouth daily as needed for moderate pain.   amantadine 100 MG capsule Commonly known as:  SYMMETREL Take 100 mg by mouth daily.   antiseptic oral rinse Liqd 15 mLs by Mouth Rinse route as needed for dry mouth.   baclofen 20 MG tablet Commonly known as:  LIORESAL Take 20 mg by mouth 4 (four) times daily. For bladder spasms   bisacodyl 10 MG suppository Commonly known as:  DULCOLAX Place 10 mg rectally as needed for moderate constipation.   CERTA-VITE PO Take 1 tablet by mouth daily.   clonazePAM 0.5 MG tablet Commonly known as:  KLONOPIN Take 0.5 mg by mouth 3 (three) times daily.   FLUoxetine HCl 60 MG Tabs Take 60 mg by mouth daily.   lactose free nutrition Liqd Take 237 mLs by mouth 2 (two) times daily between meals.   lamoTRIgine 100 MG tablet Commonly known as:  LAMICTAL Take 100 mg by mouth at bedtime.   lubiprostone 24 MCG capsule Commonly known as:  AMITIZA Take 1 capsule (24 mcg total) by mouth 2 (two) times daily with a meal.   Melatonin 5 MG Tabs Take 5 mg by mouth at bedtime.   oxybutynin 5 MG tablet Commonly known as:  DITROPAN Take 5 mg by mouth 4 (four) times daily.   oxyCODONE 5 MG immediate release tablet Commonly known as:  ROXICODONE Take one tablet by mouth every 4 hours as needed for pain   polyethylene glycol powder powder Commonly known as:  GLYCOLAX/MIRALAX Take 1  1/2 dose  daily in 12 ounces of fluid every day.   QUEtiapine 100 MG  tablet Commonly known as:  SEROQUEL Take 1 tablet (100 mg total) by mouth at bedtime.   tiZANidine 4 MG capsule Commonly known as:  ZANAFLEX Take 4 mg by mouth 2 (two) times daily.   tretinoin 0.05 % cream Commonly known as:  RETIN-A Apply 1 application topically at bedtime. face   warfarin 6 MG tablet Commonly known as:  COUMADIN Take 6 mg by mouth daily at 6 PM.    The medication list was reviewed and reconciled. All changes or newly prescribed medications were explained.  A complete medication list was provided to the patient/caregiver.  Deetta Perla MD

## 2017-04-15 NOTE — Telephone Encounter (Incomplete)
I entered this referral into the system. It would not let me send you a message, but I knew that you had helped me with [name] previously. Patient is requesting to be seen as soon as possible due to the amount of pain that she is having. She had a CT Myelo Cervical Spine with sedation at [facility]. I do not know if Dr. Lorenso CourierPowers will have access to that. If you are able to work on her referral, can you please check to be sure they will have all that she needs? I will be glad to help in any way possible. Thanks.

## 2017-04-15 NOTE — Telephone Encounter (Signed)
Any specific physician that you would like for me to [facility]?

## 2017-04-15 NOTE — Telephone Encounter (Signed)
Please advise 

## 2017-04-15 NOTE — Telephone Encounter (Signed)
Patient called advised she had a CT scan on her neck and asked if Dr Ophelia CharterYates would call her with the results. Patient also asked if she can get something for pain.

## 2017-04-15 NOTE — Telephone Encounter (Signed)
Joanette GulaAlex Powers  [facility]  . Spondylosis at C5-6, C6-7   ucall thanks

## 2017-04-15 NOTE — Telephone Encounter (Signed)
Patient returned call and stated [facility]  CB# is [phone].

## 2017-04-15 NOTE — Telephone Encounter (Signed)
I called patient especially has a difficult problem. She may require anterior and posterior fixation. She has been to both [facility] can also [facility] and will call back and let me know where she like to go and we will make arrangements to get her appointments there as soon as possible. She is taking Percocet for pain from another provider. Essentially she lets us know will make arrangements for evaluation and either do [facility] and they can discuss possible surgery with her.

## 2017-04-15 NOTE — Telephone Encounter (Signed)
Noted. Will wait for return call.

## 2017-04-16 ENCOUNTER — Encounter (INDEPENDENT_AMBULATORY_CARE_PROVIDER_SITE_OTHER): Payer: Medicare Other | Admitting: Pediatrics

## 2017-04-16 DIAGNOSIS — R482 Apraxia: Secondary | ICD-10-CM | POA: Diagnosis not present

## 2017-04-16 DIAGNOSIS — M6281 Muscle weakness (generalized): Secondary | ICD-10-CM | POA: Diagnosis not present

## 2017-04-16 DIAGNOSIS — M24549 Contracture, unspecified hand: Secondary | ICD-10-CM | POA: Diagnosis not present

## 2017-04-16 DIAGNOSIS — R293 Abnormal posture: Secondary | ICD-10-CM | POA: Diagnosis not present

## 2017-04-16 DIAGNOSIS — R2681 Unsteadiness on feet: Secondary | ICD-10-CM | POA: Diagnosis not present

## 2017-04-16 DIAGNOSIS — G809 Cerebral palsy, unspecified: Secondary | ICD-10-CM | POA: Diagnosis not present

## 2017-04-16 NOTE — Telephone Encounter (Signed)
Please see message below. I meant to forward to you. Thanks.

## 2017-04-17 DIAGNOSIS — M6281 Muscle weakness (generalized): Secondary | ICD-10-CM | POA: Diagnosis not present

## 2017-04-17 DIAGNOSIS — G809 Cerebral palsy, unspecified: Secondary | ICD-10-CM | POA: Diagnosis not present

## 2017-04-17 DIAGNOSIS — R482 Apraxia: Secondary | ICD-10-CM | POA: Diagnosis not present

## 2017-04-17 DIAGNOSIS — R293 Abnormal posture: Secondary | ICD-10-CM | POA: Diagnosis not present

## 2017-04-17 DIAGNOSIS — R2681 Unsteadiness on feet: Secondary | ICD-10-CM | POA: Diagnosis not present

## 2017-04-17 DIAGNOSIS — M24549 Contracture, unspecified hand: Secondary | ICD-10-CM | POA: Diagnosis not present

## 2017-04-17 NOTE — Progress Notes (Signed)
[facility]

## 2017-04-17 NOTE — Telephone Encounter (Signed)
Returned call to [facility] left message to rturn my call on this pt. Call back # [phone]

## 2017-04-18 DIAGNOSIS — M6281 Muscle weakness (generalized): Secondary | ICD-10-CM | POA: Diagnosis not present

## 2017-04-18 DIAGNOSIS — R293 Abnormal posture: Secondary | ICD-10-CM | POA: Diagnosis not present

## 2017-04-18 DIAGNOSIS — G808 Other cerebral palsy: Secondary | ICD-10-CM | POA: Diagnosis not present

## 2017-04-18 DIAGNOSIS — M24549 Contracture, unspecified hand: Secondary | ICD-10-CM | POA: Diagnosis not present

## 2017-04-18 DIAGNOSIS — R482 Apraxia: Secondary | ICD-10-CM | POA: Diagnosis not present

## 2017-04-18 DIAGNOSIS — I2699 Other pulmonary embolism without acute cor pulmonale: Secondary | ICD-10-CM | POA: Diagnosis not present

## 2017-04-18 DIAGNOSIS — R2681 Unsteadiness on feet: Secondary | ICD-10-CM | POA: Diagnosis not present

## 2017-04-18 DIAGNOSIS — F428 Other obsessive-compulsive disorder: Secondary | ICD-10-CM | POA: Diagnosis not present

## 2017-04-18 DIAGNOSIS — G809 Cerebral palsy, unspecified: Secondary | ICD-10-CM | POA: Diagnosis not present

## 2017-04-18 DIAGNOSIS — M542 Cervicalgia: Secondary | ICD-10-CM | POA: Diagnosis not present

## 2017-04-21 DIAGNOSIS — R482 Apraxia: Secondary | ICD-10-CM | POA: Diagnosis not present

## 2017-04-21 DIAGNOSIS — R293 Abnormal posture: Secondary | ICD-10-CM | POA: Diagnosis not present

## 2017-04-21 DIAGNOSIS — M24549 Contracture, unspecified hand: Secondary | ICD-10-CM | POA: Diagnosis not present

## 2017-04-21 DIAGNOSIS — R2681 Unsteadiness on feet: Secondary | ICD-10-CM | POA: Diagnosis not present

## 2017-04-21 DIAGNOSIS — G809 Cerebral palsy, unspecified: Secondary | ICD-10-CM | POA: Diagnosis not present

## 2017-04-21 DIAGNOSIS — M6281 Muscle weakness (generalized): Secondary | ICD-10-CM | POA: Diagnosis not present

## 2017-04-22 ENCOUNTER — Ambulatory Visit: Payer: Medicare Other | Admitting: Gastroenterology

## 2017-04-22 ENCOUNTER — Telehealth: Payer: Self-pay | Admitting: Gastroenterology

## 2017-04-22 NOTE — Telephone Encounter (Signed)
Ok, thanks.  No charge 

## 2017-04-23 DIAGNOSIS — R293 Abnormal posture: Secondary | ICD-10-CM | POA: Diagnosis not present

## 2017-04-23 DIAGNOSIS — R2681 Unsteadiness on feet: Secondary | ICD-10-CM | POA: Diagnosis not present

## 2017-04-23 DIAGNOSIS — M6281 Muscle weakness (generalized): Secondary | ICD-10-CM | POA: Diagnosis not present

## 2017-04-23 DIAGNOSIS — M24549 Contracture, unspecified hand: Secondary | ICD-10-CM | POA: Diagnosis not present

## 2017-04-23 DIAGNOSIS — R482 Apraxia: Secondary | ICD-10-CM | POA: Diagnosis not present

## 2017-04-23 DIAGNOSIS — G809 Cerebral palsy, unspecified: Secondary | ICD-10-CM | POA: Diagnosis not present

## 2017-04-24 DIAGNOSIS — M24549 Contracture, unspecified hand: Secondary | ICD-10-CM | POA: Diagnosis not present

## 2017-04-24 DIAGNOSIS — M6281 Muscle weakness (generalized): Secondary | ICD-10-CM | POA: Diagnosis not present

## 2017-04-24 DIAGNOSIS — R293 Abnormal posture: Secondary | ICD-10-CM | POA: Diagnosis not present

## 2017-04-24 DIAGNOSIS — R482 Apraxia: Secondary | ICD-10-CM | POA: Diagnosis not present

## 2017-04-24 DIAGNOSIS — R2681 Unsteadiness on feet: Secondary | ICD-10-CM | POA: Diagnosis not present

## 2017-04-24 DIAGNOSIS — G809 Cerebral palsy, unspecified: Secondary | ICD-10-CM | POA: Diagnosis not present

## 2017-04-25 DIAGNOSIS — L600 Ingrowing nail: Secondary | ICD-10-CM | POA: Diagnosis not present

## 2017-04-25 DIAGNOSIS — G809 Cerebral palsy, unspecified: Secondary | ICD-10-CM | POA: Diagnosis not present

## 2017-04-25 DIAGNOSIS — M6281 Muscle weakness (generalized): Secondary | ICD-10-CM | POA: Diagnosis not present

## 2017-04-25 DIAGNOSIS — R2681 Unsteadiness on feet: Secondary | ICD-10-CM | POA: Diagnosis not present

## 2017-04-25 DIAGNOSIS — R293 Abnormal posture: Secondary | ICD-10-CM | POA: Diagnosis not present

## 2017-04-25 DIAGNOSIS — M24549 Contracture, unspecified hand: Secondary | ICD-10-CM | POA: Diagnosis not present

## 2017-04-25 DIAGNOSIS — M79675 Pain in left toe(s): Secondary | ICD-10-CM | POA: Diagnosis not present

## 2017-04-25 DIAGNOSIS — M79674 Pain in right toe(s): Secondary | ICD-10-CM | POA: Diagnosis not present

## 2017-04-25 DIAGNOSIS — R482 Apraxia: Secondary | ICD-10-CM | POA: Diagnosis not present

## 2017-04-28 DIAGNOSIS — M24549 Contracture, unspecified hand: Secondary | ICD-10-CM | POA: Diagnosis not present

## 2017-04-28 DIAGNOSIS — G809 Cerebral palsy, unspecified: Secondary | ICD-10-CM | POA: Diagnosis not present

## 2017-04-28 DIAGNOSIS — R293 Abnormal posture: Secondary | ICD-10-CM | POA: Diagnosis not present

## 2017-04-28 DIAGNOSIS — R2681 Unsteadiness on feet: Secondary | ICD-10-CM | POA: Diagnosis not present

## 2017-04-28 DIAGNOSIS — R482 Apraxia: Secondary | ICD-10-CM | POA: Diagnosis not present

## 2017-04-28 DIAGNOSIS — M6281 Muscle weakness (generalized): Secondary | ICD-10-CM | POA: Diagnosis not present

## 2017-04-29 DIAGNOSIS — N3941 Urge incontinence: Secondary | ICD-10-CM | POA: Diagnosis not present

## 2017-04-29 DIAGNOSIS — R293 Abnormal posture: Secondary | ICD-10-CM | POA: Diagnosis not present

## 2017-04-29 DIAGNOSIS — R339 Retention of urine, unspecified: Secondary | ICD-10-CM | POA: Diagnosis not present

## 2017-04-29 DIAGNOSIS — M24549 Contracture, unspecified hand: Secondary | ICD-10-CM | POA: Diagnosis not present

## 2017-04-29 DIAGNOSIS — R482 Apraxia: Secondary | ICD-10-CM | POA: Diagnosis not present

## 2017-04-29 DIAGNOSIS — G809 Cerebral palsy, unspecified: Secondary | ICD-10-CM | POA: Diagnosis not present

## 2017-04-29 DIAGNOSIS — R2681 Unsteadiness on feet: Secondary | ICD-10-CM | POA: Diagnosis not present

## 2017-04-29 DIAGNOSIS — M6281 Muscle weakness (generalized): Secondary | ICD-10-CM | POA: Diagnosis not present

## 2017-04-30 DIAGNOSIS — M6281 Muscle weakness (generalized): Secondary | ICD-10-CM | POA: Diagnosis not present

## 2017-04-30 DIAGNOSIS — R482 Apraxia: Secondary | ICD-10-CM | POA: Diagnosis not present

## 2017-04-30 DIAGNOSIS — R2681 Unsteadiness on feet: Secondary | ICD-10-CM | POA: Diagnosis not present

## 2017-04-30 DIAGNOSIS — R293 Abnormal posture: Secondary | ICD-10-CM | POA: Diagnosis not present

## 2017-04-30 DIAGNOSIS — G809 Cerebral palsy, unspecified: Secondary | ICD-10-CM | POA: Diagnosis not present

## 2017-04-30 DIAGNOSIS — M24549 Contracture, unspecified hand: Secondary | ICD-10-CM | POA: Diagnosis not present

## 2017-05-01 DIAGNOSIS — R2681 Unsteadiness on feet: Secondary | ICD-10-CM | POA: Diagnosis not present

## 2017-05-01 DIAGNOSIS — G809 Cerebral palsy, unspecified: Secondary | ICD-10-CM | POA: Diagnosis not present

## 2017-05-01 DIAGNOSIS — M24549 Contracture, unspecified hand: Secondary | ICD-10-CM | POA: Diagnosis not present

## 2017-05-01 DIAGNOSIS — M6281 Muscle weakness (generalized): Secondary | ICD-10-CM | POA: Diagnosis not present

## 2017-05-01 DIAGNOSIS — R293 Abnormal posture: Secondary | ICD-10-CM | POA: Diagnosis not present

## 2017-05-01 DIAGNOSIS — R482 Apraxia: Secondary | ICD-10-CM | POA: Diagnosis not present

## 2017-05-04 DIAGNOSIS — M24531 Contracture, right wrist: Secondary | ICD-10-CM | POA: Diagnosis not present

## 2017-05-04 DIAGNOSIS — M6281 Muscle weakness (generalized): Secondary | ICD-10-CM | POA: Diagnosis not present

## 2017-05-04 DIAGNOSIS — R482 Apraxia: Secondary | ICD-10-CM | POA: Diagnosis not present

## 2017-05-04 DIAGNOSIS — R293 Abnormal posture: Secondary | ICD-10-CM | POA: Diagnosis not present

## 2017-05-04 DIAGNOSIS — M24541 Contracture, right hand: Secondary | ICD-10-CM | POA: Diagnosis not present

## 2017-05-04 DIAGNOSIS — G809 Cerebral palsy, unspecified: Secondary | ICD-10-CM | POA: Diagnosis not present

## 2017-05-04 DIAGNOSIS — R2681 Unsteadiness on feet: Secondary | ICD-10-CM | POA: Diagnosis not present

## 2017-05-05 DIAGNOSIS — M24531 Contracture, right wrist: Secondary | ICD-10-CM | POA: Diagnosis not present

## 2017-05-05 DIAGNOSIS — M6281 Muscle weakness (generalized): Secondary | ICD-10-CM | POA: Diagnosis not present

## 2017-05-05 DIAGNOSIS — G809 Cerebral palsy, unspecified: Secondary | ICD-10-CM | POA: Diagnosis not present

## 2017-05-05 DIAGNOSIS — R482 Apraxia: Secondary | ICD-10-CM | POA: Diagnosis not present

## 2017-05-05 DIAGNOSIS — R293 Abnormal posture: Secondary | ICD-10-CM | POA: Diagnosis not present

## 2017-05-05 DIAGNOSIS — R2681 Unsteadiness on feet: Secondary | ICD-10-CM | POA: Diagnosis not present

## 2017-05-07 DIAGNOSIS — R482 Apraxia: Secondary | ICD-10-CM | POA: Diagnosis not present

## 2017-05-07 DIAGNOSIS — M6281 Muscle weakness (generalized): Secondary | ICD-10-CM | POA: Diagnosis not present

## 2017-05-07 DIAGNOSIS — R293 Abnormal posture: Secondary | ICD-10-CM | POA: Diagnosis not present

## 2017-05-07 DIAGNOSIS — M24531 Contracture, right wrist: Secondary | ICD-10-CM | POA: Diagnosis not present

## 2017-05-07 DIAGNOSIS — R2681 Unsteadiness on feet: Secondary | ICD-10-CM | POA: Diagnosis not present

## 2017-05-07 DIAGNOSIS — G809 Cerebral palsy, unspecified: Secondary | ICD-10-CM | POA: Diagnosis not present

## 2017-05-08 DIAGNOSIS — M6281 Muscle weakness (generalized): Secondary | ICD-10-CM | POA: Diagnosis not present

## 2017-05-08 DIAGNOSIS — G809 Cerebral palsy, unspecified: Secondary | ICD-10-CM | POA: Diagnosis not present

## 2017-05-08 DIAGNOSIS — M24531 Contracture, right wrist: Secondary | ICD-10-CM | POA: Diagnosis not present

## 2017-05-08 DIAGNOSIS — R293 Abnormal posture: Secondary | ICD-10-CM | POA: Diagnosis not present

## 2017-05-08 DIAGNOSIS — R2681 Unsteadiness on feet: Secondary | ICD-10-CM | POA: Diagnosis not present

## 2017-05-08 DIAGNOSIS — R482 Apraxia: Secondary | ICD-10-CM | POA: Diagnosis not present

## 2017-05-09 DIAGNOSIS — R482 Apraxia: Secondary | ICD-10-CM | POA: Diagnosis not present

## 2017-05-09 DIAGNOSIS — G809 Cerebral palsy, unspecified: Secondary | ICD-10-CM | POA: Diagnosis not present

## 2017-05-09 DIAGNOSIS — R293 Abnormal posture: Secondary | ICD-10-CM | POA: Diagnosis not present

## 2017-05-09 DIAGNOSIS — M6281 Muscle weakness (generalized): Secondary | ICD-10-CM | POA: Diagnosis not present

## 2017-05-09 DIAGNOSIS — M24531 Contracture, right wrist: Secondary | ICD-10-CM | POA: Diagnosis not present

## 2017-05-09 DIAGNOSIS — R2681 Unsteadiness on feet: Secondary | ICD-10-CM | POA: Diagnosis not present

## 2017-05-10 DIAGNOSIS — M6281 Muscle weakness (generalized): Secondary | ICD-10-CM | POA: Diagnosis not present

## 2017-05-10 DIAGNOSIS — G809 Cerebral palsy, unspecified: Secondary | ICD-10-CM | POA: Diagnosis not present

## 2017-05-10 DIAGNOSIS — R293 Abnormal posture: Secondary | ICD-10-CM | POA: Diagnosis not present

## 2017-05-10 DIAGNOSIS — M24531 Contracture, right wrist: Secondary | ICD-10-CM | POA: Diagnosis not present

## 2017-05-10 DIAGNOSIS — R482 Apraxia: Secondary | ICD-10-CM | POA: Diagnosis not present

## 2017-05-10 DIAGNOSIS — R2681 Unsteadiness on feet: Secondary | ICD-10-CM | POA: Diagnosis not present

## 2017-05-12 DIAGNOSIS — R482 Apraxia: Secondary | ICD-10-CM | POA: Diagnosis not present

## 2017-05-12 DIAGNOSIS — R293 Abnormal posture: Secondary | ICD-10-CM | POA: Diagnosis not present

## 2017-05-12 DIAGNOSIS — R2681 Unsteadiness on feet: Secondary | ICD-10-CM | POA: Diagnosis not present

## 2017-05-12 DIAGNOSIS — M6281 Muscle weakness (generalized): Secondary | ICD-10-CM | POA: Diagnosis not present

## 2017-05-12 DIAGNOSIS — M24531 Contracture, right wrist: Secondary | ICD-10-CM | POA: Diagnosis not present

## 2017-05-12 DIAGNOSIS — G809 Cerebral palsy, unspecified: Secondary | ICD-10-CM | POA: Diagnosis not present

## 2017-05-13 DIAGNOSIS — M24531 Contracture, right wrist: Secondary | ICD-10-CM | POA: Diagnosis not present

## 2017-05-13 DIAGNOSIS — R2681 Unsteadiness on feet: Secondary | ICD-10-CM | POA: Diagnosis not present

## 2017-05-13 DIAGNOSIS — F331 Major depressive disorder, recurrent, moderate: Secondary | ICD-10-CM | POA: Diagnosis not present

## 2017-05-13 DIAGNOSIS — R293 Abnormal posture: Secondary | ICD-10-CM | POA: Diagnosis not present

## 2017-05-13 DIAGNOSIS — M6281 Muscle weakness (generalized): Secondary | ICD-10-CM | POA: Diagnosis not present

## 2017-05-13 DIAGNOSIS — G809 Cerebral palsy, unspecified: Secondary | ICD-10-CM | POA: Diagnosis not present

## 2017-05-13 DIAGNOSIS — R482 Apraxia: Secondary | ICD-10-CM | POA: Diagnosis not present

## 2017-05-14 DIAGNOSIS — M24531 Contracture, right wrist: Secondary | ICD-10-CM | POA: Diagnosis not present

## 2017-05-14 DIAGNOSIS — G809 Cerebral palsy, unspecified: Secondary | ICD-10-CM | POA: Diagnosis not present

## 2017-05-14 DIAGNOSIS — R293 Abnormal posture: Secondary | ICD-10-CM | POA: Diagnosis not present

## 2017-05-14 DIAGNOSIS — R2681 Unsteadiness on feet: Secondary | ICD-10-CM | POA: Diagnosis not present

## 2017-05-14 DIAGNOSIS — M6281 Muscle weakness (generalized): Secondary | ICD-10-CM | POA: Diagnosis not present

## 2017-05-14 DIAGNOSIS — R482 Apraxia: Secondary | ICD-10-CM | POA: Diagnosis not present

## 2017-05-15 DIAGNOSIS — G809 Cerebral palsy, unspecified: Secondary | ICD-10-CM | POA: Diagnosis not present

## 2017-05-15 DIAGNOSIS — R2681 Unsteadiness on feet: Secondary | ICD-10-CM | POA: Diagnosis not present

## 2017-05-15 DIAGNOSIS — M6281 Muscle weakness (generalized): Secondary | ICD-10-CM | POA: Diagnosis not present

## 2017-05-15 DIAGNOSIS — R482 Apraxia: Secondary | ICD-10-CM | POA: Diagnosis not present

## 2017-05-15 DIAGNOSIS — R293 Abnormal posture: Secondary | ICD-10-CM | POA: Diagnosis not present

## 2017-05-15 DIAGNOSIS — M24531 Contracture, right wrist: Secondary | ICD-10-CM | POA: Diagnosis not present

## 2017-05-16 DIAGNOSIS — R293 Abnormal posture: Secondary | ICD-10-CM | POA: Diagnosis not present

## 2017-05-16 DIAGNOSIS — R482 Apraxia: Secondary | ICD-10-CM | POA: Diagnosis not present

## 2017-05-16 DIAGNOSIS — G809 Cerebral palsy, unspecified: Secondary | ICD-10-CM | POA: Diagnosis not present

## 2017-05-16 DIAGNOSIS — R2681 Unsteadiness on feet: Secondary | ICD-10-CM | POA: Diagnosis not present

## 2017-05-16 DIAGNOSIS — M24531 Contracture, right wrist: Secondary | ICD-10-CM | POA: Diagnosis not present

## 2017-05-16 DIAGNOSIS — M6281 Muscle weakness (generalized): Secondary | ICD-10-CM | POA: Diagnosis not present

## 2017-05-19 DIAGNOSIS — M6281 Muscle weakness (generalized): Secondary | ICD-10-CM | POA: Diagnosis not present

## 2017-05-19 DIAGNOSIS — M24531 Contracture, right wrist: Secondary | ICD-10-CM | POA: Diagnosis not present

## 2017-05-19 DIAGNOSIS — G809 Cerebral palsy, unspecified: Secondary | ICD-10-CM | POA: Diagnosis not present

## 2017-05-19 DIAGNOSIS — R293 Abnormal posture: Secondary | ICD-10-CM | POA: Diagnosis not present

## 2017-05-19 DIAGNOSIS — R2681 Unsteadiness on feet: Secondary | ICD-10-CM | POA: Diagnosis not present

## 2017-05-19 DIAGNOSIS — R482 Apraxia: Secondary | ICD-10-CM | POA: Diagnosis not present

## 2017-05-20 ENCOUNTER — Encounter: Payer: Self-pay | Admitting: Physical Medicine & Rehabilitation

## 2017-05-20 ENCOUNTER — Encounter: Payer: Medicare Other | Attending: Physical Medicine & Rehabilitation

## 2017-05-20 ENCOUNTER — Ambulatory Visit (HOSPITAL_BASED_OUTPATIENT_CLINIC_OR_DEPARTMENT_OTHER): Payer: Medicare Other | Admitting: Physical Medicine & Rehabilitation

## 2017-05-20 VITALS — BP 108/64 | HR 77

## 2017-05-20 DIAGNOSIS — M6281 Muscle weakness (generalized): Secondary | ICD-10-CM | POA: Diagnosis not present

## 2017-05-20 DIAGNOSIS — R482 Apraxia: Secondary | ICD-10-CM | POA: Diagnosis not present

## 2017-05-20 DIAGNOSIS — R252 Cramp and spasm: Secondary | ICD-10-CM | POA: Insufficient documentation

## 2017-05-20 DIAGNOSIS — G801 Spastic diplegic cerebral palsy: Secondary | ICD-10-CM

## 2017-05-20 DIAGNOSIS — R293 Abnormal posture: Secondary | ICD-10-CM | POA: Diagnosis not present

## 2017-05-20 DIAGNOSIS — R2681 Unsteadiness on feet: Secondary | ICD-10-CM | POA: Diagnosis not present

## 2017-05-20 DIAGNOSIS — G809 Cerebral palsy, unspecified: Secondary | ICD-10-CM | POA: Diagnosis not present

## 2017-05-20 DIAGNOSIS — M24531 Contracture, right wrist: Secondary | ICD-10-CM | POA: Diagnosis not present

## 2017-05-20 NOTE — Progress Notes (Signed)
Botox Injection for spasticity using needle EMG guidance  Dilution: 50 Units/ml Indication: Severe spasticity which interferes with ADL,mobility and/or  hygiene and is unresponsive to medication management and other conservative care Informed consent was obtained after describing risks and benefits of the procedure with the patient. This includes bleeding, bruising, infection, excessive weakness, or medication side effects. A REMS form is on file and signed. Needle: 27g 1" needle electrode Number of units per muscle  Biceps150 FCR50 FCU50 FDP50  Brachiorad 50 Pronator teres 25 Pronator quad 25 All injections were done after obtaining appropriate EMG activity and after negative drawback for blood. The patient tolerated the procedure well. Post procedure instructions were given. A followup appointment was made.

## 2017-05-20 NOTE — Patient Instructions (Signed)

## 2017-05-21 DIAGNOSIS — M6281 Muscle weakness (generalized): Secondary | ICD-10-CM | POA: Diagnosis not present

## 2017-05-21 DIAGNOSIS — M24531 Contracture, right wrist: Secondary | ICD-10-CM | POA: Diagnosis not present

## 2017-05-21 DIAGNOSIS — R293 Abnormal posture: Secondary | ICD-10-CM | POA: Diagnosis not present

## 2017-05-21 DIAGNOSIS — G809 Cerebral palsy, unspecified: Secondary | ICD-10-CM | POA: Diagnosis not present

## 2017-05-21 DIAGNOSIS — R482 Apraxia: Secondary | ICD-10-CM | POA: Diagnosis not present

## 2017-05-21 DIAGNOSIS — R2681 Unsteadiness on feet: Secondary | ICD-10-CM | POA: Diagnosis not present

## 2017-05-22 DIAGNOSIS — R293 Abnormal posture: Secondary | ICD-10-CM | POA: Diagnosis not present

## 2017-05-22 DIAGNOSIS — M24531 Contracture, right wrist: Secondary | ICD-10-CM | POA: Diagnosis not present

## 2017-05-22 DIAGNOSIS — R482 Apraxia: Secondary | ICD-10-CM | POA: Diagnosis not present

## 2017-05-22 DIAGNOSIS — R2681 Unsteadiness on feet: Secondary | ICD-10-CM | POA: Diagnosis not present

## 2017-05-22 DIAGNOSIS — G809 Cerebral palsy, unspecified: Secondary | ICD-10-CM | POA: Diagnosis not present

## 2017-05-22 DIAGNOSIS — M6281 Muscle weakness (generalized): Secondary | ICD-10-CM | POA: Diagnosis not present

## 2017-05-23 DIAGNOSIS — G809 Cerebral palsy, unspecified: Secondary | ICD-10-CM | POA: Diagnosis not present

## 2017-05-23 DIAGNOSIS — R2681 Unsteadiness on feet: Secondary | ICD-10-CM | POA: Diagnosis not present

## 2017-05-23 DIAGNOSIS — R293 Abnormal posture: Secondary | ICD-10-CM | POA: Diagnosis not present

## 2017-05-23 DIAGNOSIS — F418 Other specified anxiety disorders: Secondary | ICD-10-CM | POA: Diagnosis not present

## 2017-05-23 DIAGNOSIS — M24531 Contracture, right wrist: Secondary | ICD-10-CM | POA: Diagnosis not present

## 2017-05-23 DIAGNOSIS — R482 Apraxia: Secondary | ICD-10-CM | POA: Diagnosis not present

## 2017-05-23 DIAGNOSIS — F428 Other obsessive-compulsive disorder: Secondary | ICD-10-CM | POA: Diagnosis not present

## 2017-05-23 DIAGNOSIS — M6281 Muscle weakness (generalized): Secondary | ICD-10-CM | POA: Diagnosis not present

## 2017-05-25 DIAGNOSIS — R482 Apraxia: Secondary | ICD-10-CM | POA: Diagnosis not present

## 2017-05-25 DIAGNOSIS — R2681 Unsteadiness on feet: Secondary | ICD-10-CM | POA: Diagnosis not present

## 2017-05-25 DIAGNOSIS — G809 Cerebral palsy, unspecified: Secondary | ICD-10-CM | POA: Diagnosis not present

## 2017-05-25 DIAGNOSIS — R293 Abnormal posture: Secondary | ICD-10-CM | POA: Diagnosis not present

## 2017-05-25 DIAGNOSIS — M6281 Muscle weakness (generalized): Secondary | ICD-10-CM | POA: Diagnosis not present

## 2017-05-25 DIAGNOSIS — M24531 Contracture, right wrist: Secondary | ICD-10-CM | POA: Diagnosis not present

## 2017-05-26 DIAGNOSIS — R293 Abnormal posture: Secondary | ICD-10-CM | POA: Diagnosis not present

## 2017-05-26 DIAGNOSIS — R482 Apraxia: Secondary | ICD-10-CM | POA: Diagnosis not present

## 2017-05-26 DIAGNOSIS — M6281 Muscle weakness (generalized): Secondary | ICD-10-CM | POA: Diagnosis not present

## 2017-05-26 DIAGNOSIS — R2681 Unsteadiness on feet: Secondary | ICD-10-CM | POA: Diagnosis not present

## 2017-05-26 DIAGNOSIS — G809 Cerebral palsy, unspecified: Secondary | ICD-10-CM | POA: Diagnosis not present

## 2017-05-26 DIAGNOSIS — M24531 Contracture, right wrist: Secondary | ICD-10-CM | POA: Diagnosis not present

## 2017-05-27 DIAGNOSIS — R2681 Unsteadiness on feet: Secondary | ICD-10-CM | POA: Diagnosis not present

## 2017-05-27 DIAGNOSIS — R482 Apraxia: Secondary | ICD-10-CM | POA: Diagnosis not present

## 2017-05-27 DIAGNOSIS — M24531 Contracture, right wrist: Secondary | ICD-10-CM | POA: Diagnosis not present

## 2017-05-27 DIAGNOSIS — M6281 Muscle weakness (generalized): Secondary | ICD-10-CM | POA: Diagnosis not present

## 2017-05-27 DIAGNOSIS — G809 Cerebral palsy, unspecified: Secondary | ICD-10-CM | POA: Diagnosis not present

## 2017-05-27 DIAGNOSIS — R293 Abnormal posture: Secondary | ICD-10-CM | POA: Diagnosis not present

## 2017-05-28 DIAGNOSIS — R293 Abnormal posture: Secondary | ICD-10-CM | POA: Diagnosis not present

## 2017-05-28 DIAGNOSIS — R2681 Unsteadiness on feet: Secondary | ICD-10-CM | POA: Diagnosis not present

## 2017-05-28 DIAGNOSIS — G809 Cerebral palsy, unspecified: Secondary | ICD-10-CM | POA: Diagnosis not present

## 2017-05-28 DIAGNOSIS — M6281 Muscle weakness (generalized): Secondary | ICD-10-CM | POA: Diagnosis not present

## 2017-05-28 DIAGNOSIS — R482 Apraxia: Secondary | ICD-10-CM | POA: Diagnosis not present

## 2017-05-28 DIAGNOSIS — M24531 Contracture, right wrist: Secondary | ICD-10-CM | POA: Diagnosis not present

## 2017-05-29 DIAGNOSIS — R2681 Unsteadiness on feet: Secondary | ICD-10-CM | POA: Diagnosis not present

## 2017-05-29 DIAGNOSIS — M6281 Muscle weakness (generalized): Secondary | ICD-10-CM | POA: Diagnosis not present

## 2017-05-29 DIAGNOSIS — R482 Apraxia: Secondary | ICD-10-CM | POA: Diagnosis not present

## 2017-05-29 DIAGNOSIS — G809 Cerebral palsy, unspecified: Secondary | ICD-10-CM | POA: Diagnosis not present

## 2017-05-29 DIAGNOSIS — M24531 Contracture, right wrist: Secondary | ICD-10-CM | POA: Diagnosis not present

## 2017-05-29 DIAGNOSIS — R293 Abnormal posture: Secondary | ICD-10-CM | POA: Diagnosis not present

## 2017-05-30 DIAGNOSIS — F429 Obsessive-compulsive disorder, unspecified: Secondary | ICD-10-CM | POA: Diagnosis not present

## 2017-05-30 DIAGNOSIS — F418 Other specified anxiety disorders: Secondary | ICD-10-CM | POA: Diagnosis not present

## 2017-06-02 DIAGNOSIS — M24531 Contracture, right wrist: Secondary | ICD-10-CM | POA: Diagnosis not present

## 2017-06-02 DIAGNOSIS — R2681 Unsteadiness on feet: Secondary | ICD-10-CM | POA: Diagnosis not present

## 2017-06-02 DIAGNOSIS — R293 Abnormal posture: Secondary | ICD-10-CM | POA: Diagnosis not present

## 2017-06-02 DIAGNOSIS — M6281 Muscle weakness (generalized): Secondary | ICD-10-CM | POA: Diagnosis not present

## 2017-06-02 DIAGNOSIS — M24541 Contracture, right hand: Secondary | ICD-10-CM | POA: Diagnosis not present

## 2017-06-02 DIAGNOSIS — G809 Cerebral palsy, unspecified: Secondary | ICD-10-CM | POA: Diagnosis not present

## 2017-06-02 DIAGNOSIS — R482 Apraxia: Secondary | ICD-10-CM | POA: Diagnosis not present

## 2017-06-03 DIAGNOSIS — R293 Abnormal posture: Secondary | ICD-10-CM | POA: Diagnosis not present

## 2017-06-03 DIAGNOSIS — M24531 Contracture, right wrist: Secondary | ICD-10-CM | POA: Diagnosis not present

## 2017-06-03 DIAGNOSIS — M4182 Other forms of scoliosis, cervical region: Secondary | ICD-10-CM | POA: Diagnosis not present

## 2017-06-03 DIAGNOSIS — G800 Spastic quadriplegic cerebral palsy: Secondary | ICD-10-CM | POA: Diagnosis not present

## 2017-06-03 DIAGNOSIS — G249 Dystonia, unspecified: Secondary | ICD-10-CM | POA: Diagnosis not present

## 2017-06-03 DIAGNOSIS — M4142 Neuromuscular scoliosis, cervical region: Secondary | ICD-10-CM | POA: Diagnosis not present

## 2017-06-03 DIAGNOSIS — G809 Cerebral palsy, unspecified: Secondary | ICD-10-CM | POA: Diagnosis not present

## 2017-06-03 DIAGNOSIS — R482 Apraxia: Secondary | ICD-10-CM | POA: Diagnosis not present

## 2017-06-03 DIAGNOSIS — R2681 Unsteadiness on feet: Secondary | ICD-10-CM | POA: Diagnosis not present

## 2017-06-03 DIAGNOSIS — Z993 Dependence on wheelchair: Secondary | ICD-10-CM | POA: Diagnosis not present

## 2017-06-03 DIAGNOSIS — M6281 Muscle weakness (generalized): Secondary | ICD-10-CM | POA: Diagnosis not present

## 2017-06-04 DIAGNOSIS — G809 Cerebral palsy, unspecified: Secondary | ICD-10-CM | POA: Diagnosis not present

## 2017-06-04 DIAGNOSIS — M6281 Muscle weakness (generalized): Secondary | ICD-10-CM | POA: Diagnosis not present

## 2017-06-04 DIAGNOSIS — R2681 Unsteadiness on feet: Secondary | ICD-10-CM | POA: Diagnosis not present

## 2017-06-04 DIAGNOSIS — M24531 Contracture, right wrist: Secondary | ICD-10-CM | POA: Diagnosis not present

## 2017-06-04 DIAGNOSIS — R482 Apraxia: Secondary | ICD-10-CM | POA: Diagnosis not present

## 2017-06-04 DIAGNOSIS — R293 Abnormal posture: Secondary | ICD-10-CM | POA: Diagnosis not present

## 2017-06-05 DIAGNOSIS — R2681 Unsteadiness on feet: Secondary | ICD-10-CM | POA: Diagnosis not present

## 2017-06-05 DIAGNOSIS — R482 Apraxia: Secondary | ICD-10-CM | POA: Diagnosis not present

## 2017-06-05 DIAGNOSIS — M6281 Muscle weakness (generalized): Secondary | ICD-10-CM | POA: Diagnosis not present

## 2017-06-05 DIAGNOSIS — M24531 Contracture, right wrist: Secondary | ICD-10-CM | POA: Diagnosis not present

## 2017-06-05 DIAGNOSIS — G809 Cerebral palsy, unspecified: Secondary | ICD-10-CM | POA: Diagnosis not present

## 2017-06-05 DIAGNOSIS — R293 Abnormal posture: Secondary | ICD-10-CM | POA: Diagnosis not present

## 2017-06-06 DIAGNOSIS — R2681 Unsteadiness on feet: Secondary | ICD-10-CM | POA: Diagnosis not present

## 2017-06-06 DIAGNOSIS — M6281 Muscle weakness (generalized): Secondary | ICD-10-CM | POA: Diagnosis not present

## 2017-06-06 DIAGNOSIS — R293 Abnormal posture: Secondary | ICD-10-CM | POA: Diagnosis not present

## 2017-06-06 DIAGNOSIS — R482 Apraxia: Secondary | ICD-10-CM | POA: Diagnosis not present

## 2017-06-06 DIAGNOSIS — G809 Cerebral palsy, unspecified: Secondary | ICD-10-CM | POA: Diagnosis not present

## 2017-06-06 DIAGNOSIS — M24531 Contracture, right wrist: Secondary | ICD-10-CM | POA: Diagnosis not present

## 2017-06-09 DIAGNOSIS — G809 Cerebral palsy, unspecified: Secondary | ICD-10-CM | POA: Diagnosis not present

## 2017-06-09 DIAGNOSIS — R293 Abnormal posture: Secondary | ICD-10-CM | POA: Diagnosis not present

## 2017-06-09 DIAGNOSIS — M24531 Contracture, right wrist: Secondary | ICD-10-CM | POA: Diagnosis not present

## 2017-06-09 DIAGNOSIS — R2681 Unsteadiness on feet: Secondary | ICD-10-CM | POA: Diagnosis not present

## 2017-06-09 DIAGNOSIS — R482 Apraxia: Secondary | ICD-10-CM | POA: Diagnosis not present

## 2017-06-09 DIAGNOSIS — M6281 Muscle weakness (generalized): Secondary | ICD-10-CM | POA: Diagnosis not present

## 2017-06-10 DIAGNOSIS — R293 Abnormal posture: Secondary | ICD-10-CM | POA: Diagnosis not present

## 2017-06-10 DIAGNOSIS — M24531 Contracture, right wrist: Secondary | ICD-10-CM | POA: Diagnosis not present

## 2017-06-10 DIAGNOSIS — R482 Apraxia: Secondary | ICD-10-CM | POA: Diagnosis not present

## 2017-06-10 DIAGNOSIS — M6281 Muscle weakness (generalized): Secondary | ICD-10-CM | POA: Diagnosis not present

## 2017-06-10 DIAGNOSIS — R2681 Unsteadiness on feet: Secondary | ICD-10-CM | POA: Diagnosis not present

## 2017-06-10 DIAGNOSIS — G809 Cerebral palsy, unspecified: Secondary | ICD-10-CM | POA: Diagnosis not present

## 2017-06-11 DIAGNOSIS — G809 Cerebral palsy, unspecified: Secondary | ICD-10-CM | POA: Diagnosis not present

## 2017-06-11 DIAGNOSIS — M24531 Contracture, right wrist: Secondary | ICD-10-CM | POA: Diagnosis not present

## 2017-06-11 DIAGNOSIS — R2681 Unsteadiness on feet: Secondary | ICD-10-CM | POA: Diagnosis not present

## 2017-06-11 DIAGNOSIS — R482 Apraxia: Secondary | ICD-10-CM | POA: Diagnosis not present

## 2017-06-11 DIAGNOSIS — M6281 Muscle weakness (generalized): Secondary | ICD-10-CM | POA: Diagnosis not present

## 2017-06-11 DIAGNOSIS — R293 Abnormal posture: Secondary | ICD-10-CM | POA: Diagnosis not present

## 2017-06-12 DIAGNOSIS — M6281 Muscle weakness (generalized): Secondary | ICD-10-CM | POA: Diagnosis not present

## 2017-06-12 DIAGNOSIS — R293 Abnormal posture: Secondary | ICD-10-CM | POA: Diagnosis not present

## 2017-06-12 DIAGNOSIS — M24531 Contracture, right wrist: Secondary | ICD-10-CM | POA: Diagnosis not present

## 2017-06-12 DIAGNOSIS — R2681 Unsteadiness on feet: Secondary | ICD-10-CM | POA: Diagnosis not present

## 2017-06-12 DIAGNOSIS — G809 Cerebral palsy, unspecified: Secondary | ICD-10-CM | POA: Diagnosis not present

## 2017-06-12 DIAGNOSIS — R482 Apraxia: Secondary | ICD-10-CM | POA: Diagnosis not present

## 2017-06-13 ENCOUNTER — Encounter (HOSPITAL_COMMUNITY): Payer: Self-pay | Admitting: *Deleted

## 2017-06-13 ENCOUNTER — Emergency Department (HOSPITAL_COMMUNITY)
Admission: EM | Admit: 2017-06-13 | Discharge: 2017-06-13 | Disposition: A | Discharge status: Home / Self Care | Payer: Medicare Other | Attending: Emergency Medicine | Admitting: Emergency Medicine

## 2017-06-13 DIAGNOSIS — R1030 Lower abdominal pain, unspecified: Secondary | ICD-10-CM | POA: Diagnosis not present

## 2017-06-13 DIAGNOSIS — G808 Other cerebral palsy: Secondary | ICD-10-CM | POA: Diagnosis not present

## 2017-06-13 DIAGNOSIS — Z7901 Long term (current) use of anticoagulants: Secondary | ICD-10-CM | POA: Insufficient documentation

## 2017-06-13 DIAGNOSIS — R2681 Unsteadiness on feet: Secondary | ICD-10-CM | POA: Diagnosis not present

## 2017-06-13 DIAGNOSIS — Z79899 Other long term (current) drug therapy: Secondary | ICD-10-CM | POA: Insufficient documentation

## 2017-06-13 DIAGNOSIS — R293 Abnormal posture: Secondary | ICD-10-CM | POA: Diagnosis not present

## 2017-06-13 DIAGNOSIS — R339 Retention of urine, unspecified: Secondary | ICD-10-CM | POA: Diagnosis not present

## 2017-06-13 DIAGNOSIS — T83091A Other mechanical complication of indwelling urethral catheter, initial encounter: Secondary | ICD-10-CM | POA: Diagnosis not present

## 2017-06-13 DIAGNOSIS — R482 Apraxia: Secondary | ICD-10-CM | POA: Diagnosis not present

## 2017-06-13 DIAGNOSIS — G809 Cerebral palsy, unspecified: Secondary | ICD-10-CM | POA: Diagnosis not present

## 2017-06-13 DIAGNOSIS — I10 Essential (primary) hypertension: Secondary | ICD-10-CM | POA: Insufficient documentation

## 2017-06-13 DIAGNOSIS — R109 Unspecified abdominal pain: Secondary | ICD-10-CM | POA: Diagnosis present

## 2017-06-13 DIAGNOSIS — M6281 Muscle weakness (generalized): Secondary | ICD-10-CM | POA: Diagnosis not present

## 2017-06-13 DIAGNOSIS — M24531 Contracture, right wrist: Secondary | ICD-10-CM | POA: Diagnosis not present

## 2017-06-13 LAB — COMPREHENSIVE METABOLIC PANEL
ALT: 24 U/L (ref 14–54)
AST: 27 U/L (ref 15–41)
Albumin: 4.3 g/dL (ref 3.5–5.0)
Alkaline Phosphatase: 72 U/L (ref 38–126)
Anion gap: 9 (ref 5–15)
BILIRUBIN TOTAL: 0.7 mg/dL (ref 0.3–1.2)
BUN: 7 mg/dL (ref 6–20)
CO2: 24 mmol/L (ref 22–32)
Calcium: 8.9 mg/dL (ref 8.9–10.3)
Chloride: 101 mmol/L (ref 101–111)
Creatinine, Ser: 0.57 mg/dL (ref 0.44–1.00)
GFR calc non Af Amer: >60 mL/min (ref >60)
Glucose, Bld: 97 mg/dL (ref 65–99)
POTASSIUM: 4.1 mmol/L (ref 3.5–5.1)
Sodium: 134 mmol/L — ABNORMAL LOW (ref 135–145)
TOTAL PROTEIN: 7.2 g/dL (ref 6.5–8.1)

## 2017-06-13 LAB — URINALYSIS, ROUTINE W REFLEX MICROSCOPIC
Bilirubin Urine: NEGATIVE
GLUCOSE, UA: NEGATIVE mg/dL
Ketones, ur: NEGATIVE mg/dL
Nitrite: NEGATIVE
Protein, ur: 100 mg/dL — AB
Specific Gravity, Urine: 1.013 (ref 1.005–1.030)
Squamous Epithelial / HPF: NONE SEEN
TRANS EPITHEL UA: 2
pH: 7 (ref 5.0–8.0)

## 2017-06-13 LAB — CBC WITH DIFFERENTIAL/PLATELET
BASOS ABS: 0 10*3/uL (ref 0.0–0.1)
Basophils Relative: 0 %
EOS PCT: 3 %
Eosinophils Absolute: 0.3 10*3/uL (ref 0.0–0.7)
HCT: 39.2 % (ref 36.0–46.0)
Hemoglobin: 13 g/dL (ref 12.0–15.0)
LYMPHS ABS: 2.2 10*3/uL (ref 0.7–4.0)
Lymphocytes Relative: 27 %
MCH: 29.9 pg (ref 26.0–34.0)
MCHC: 33.2 g/dL (ref 30.0–36.0)
MCV: 90.1 fL (ref 78.0–100.0)
MONOS PCT: 8 %
Monocytes Absolute: 0.7 10*3/uL (ref 0.1–1.0)
Neutro Abs: 5 10*3/uL (ref 1.7–7.7)
Neutrophils Relative %: 62 %
PLATELETS: 232 10*3/uL (ref 150–400)
RBC: 4.35 MIL/uL (ref 3.87–5.11)
RDW: 13.9 % (ref 11.5–15.5)
WBC: 8.1 10*3/uL (ref 4.0–10.5)

## 2017-06-13 LAB — I-STAT TROPONIN, ED: TROPONIN I, POC: 0 ng/mL (ref 0.00–0.08)

## 2017-06-13 MED ORDER — OXYCODONE HCL 5 MG PO TABS
5.0000 mg | ORAL_TABLET | Freq: Once | ORAL | Status: DC
Start: 2017-06-13 — End: 2017-06-13

## 2017-06-13 MED ORDER — CEPHALEXIN 500 MG PO CAPS: 500.0000 mg | ORAL_CAPSULE | Freq: Two times a day (BID) | ORAL | 0 refills | Status: DC

## 2017-06-13 MED ORDER — HYDROMORPHONE HCL 1 MG/ML IJ SOLN
1.0000 mg | Freq: Once | INTRAMUSCULAR | Status: AC
  Administered 2017-06-13: 1 mg via INTRAVENOUS
  Filled 2017-06-13: qty 1

## 2017-06-13 MED ORDER — HYDROMORPHONE HCL 1 MG/ML IJ SOLN: 1.0000 mg | Freq: Once | INTRAMUSCULAR | Status: DC

## 2017-06-13 MED ORDER — OXYCODONE HCL 5 MG PO TABS
5.0000 mg | ORAL_TABLET | Freq: Once | ORAL | Status: AC
  Administered 2017-06-13: 5 mg via ORAL
  Filled 2017-06-13: qty 1

## 2017-06-13 NOTE — ED Triage Notes (Signed)
Pt arrives via [facility]. Pt has c/o urinary retention and states her catheter hasn't been draining. Urine in catheter appears cloudy, no sediment noted by EMS.

## 2017-06-13 NOTE — ED Notes (Signed)
[facility] called for transport.  

## 2017-06-13 NOTE — ED Provider Notes (Signed)
[facility] DEPT Provider Note   CSN: [id number] Arrival date & time: 06/13/17  1500     History   Chief Complaint Chief Complaint  Patient presents with  . Urinary Retention    HPI [name] [name] is a 41 y.o. female.  HPI  Patient, with past medical history of cerebral palsy, suprapubic catheter, presents to ED for 1 day history of suprapubic abdominal pain and urinary retention. She states that she has had her spasms occasionally and that this feels worse and is beyond 10 on the pain scale. She reports pain despite the use of her home oxycodone, Baclofen and zanaflex. She states that she gets her catheter changed monthly. However she is unsure if she got her catheter changed in June as her provider told her that the last time she got a change was the end of May. She reports decrease in urine output since yesterday as well. She denies any nausea, vomiting, fever, diarrhea or constipation.   Past Medical History:  Diagnosis Date  . Abdominal pain 01/02/2012   Overview:  Overview:  Per Previous pcp note- Dr. Ta--Pt has history of endometriosis and had DUB in 2011.  She She had u/s that showed normal endometrial stripe.  She had endometrial bx that was wnl.  She has a lot of abd cramping every month.  This cramping was sometimes not relieved with hydrocodone.  She was referred to [facility] for IUD placement to help endometriosis and abd cramping.  [name]  . Abdominal pain 01/02/2012   Overview:  Overview:  Per Previous pcp note- Dr. Ta--Pt has history of endometriosis and had DUB in 2011.  She She had u/s that showed normal endometrial stripe.  She had endometrial bx that was wnl.  She has a lot of abd cramping every month.  This cramping was sometimes not relieved with hydrocodone.  She was referred to [facility] for IUD placement to help endometriosis and abd cramping.  [name] was placed 04/30/11 by [facility].  Pt still complains of abd cramping, which I am treating with hydrocodone and  ultram. Pt reports some initial relief of pain from IUD, but states it has now returned back to similar to previous cramping. Gave patient a shot of Toradol 60 mg IM today-since positive exacerbation of abdominal cramping during past 2 weeks. Physical exam reassuring. Patient may need to return [facility] for further workup since she is plugged in with the GYN providers there for further treatment options regarding endometriosis and abdominal cramping. The dysfunctional uterine bleeding now well contr  . Acute cystitis with hematuria   . Acute cystitis without hematuria   . Acute respiratory failure with hypoxia ([name])   . Anemia   . Cellulitis 12/22/2014  . Cerebral palsy ([name])    Spastic Cerebral palsy, mentally intact  . Contracture, joint, multiple sites    Electric wheelchair, uses left hand to operate chair.   . Depression   . Disease of female genital organs 10/24/2010   Overview:  Overview:  Pt has history of endometriosis and had DUB in 2011.  She had u/s that showed normal endometrial stripe.  She had endometrial bx that was wnl.  She has a lot of abd cramping every month.  This cramping was sometimes not relieved with hydrocodone.  She was referred to [name] for IUD placement to help endometriosis and abd cramping.  [name] was placed 04/30/11 by [facility].  Pt still complains of abd cramping, which was treated with hydrocodone and ultram.  Last seen at [facility] by OB/GYN 12/24/11, given Depo Lupron injection.  Ordering CT scan to r/o additional cause of abdominal pain (pt with known endometriosis and will not tolerate vaginal U/S, pain not improved s/p [name] or with Lupron injection-- only caused hot flashes and amenorrhea, no pain relief).  Also made referral to pain clinic for better pain control.  Do not think hysterectomy would help with pain, and concern for contamination of baclofen pump if did proceed with surgery.    8/5/13[name] [facility]: Pt was seen by   . DJD (degenerative  joint disease)   . Dysarthria   . Dyspnea and respiratory abnormality 02/04/2011   Overview:  Overview:  Pt has history of recurrent PE and is on chronic coumadin.  Pt has been complaining of dyspnea that she describes as chest tightness.  She has been to the ER multiple times for this.  Usually she gets CTA and serial CE.  She has had at least 6 CTA in a period of since 2011.  She also has an O2 requirement. It is unclear why pt feels dyspneic, complains of chest tightness, or has O2 requirement.  I thought that her complaint may be from deconditioning and referred her to PT.  Unfortunately Medicaid only pays for 4 PT visits.  Pt should be doing these exercises at home, but she has not.  I have referred her to [facility], and she has been seen by [name].  He did not understand the etiology of her dyspnea and O2 requirement.  He will continue to follow her.   Last Assessment & Plan:  Pt presents with concerns for chest tightness and dyspnea.  She was seen in ED yesterday and discharged home.  While there she had a CT chest 04/20/11 showed 1.  Multifocal degradation as detailed above.  N  . Endometriosis   . GERD (gastroesophageal reflux disease)   . Gram positive sepsis ([name]) 10/24/2014  . Gross hematuria 03/12/2013   Overview:  Overview:  status post replacement of suprapubic tube 03/04/2013  Last Assessment & Plan:  Recent urine culture collected in ED on 5/4 did not result in significant growth. Moreover, patient has been asymptomatic with normal WBC, no fever and no true symptoms making symptomatic UTI less likely.  Will continue to monitor for any sign of infection.  Patient's xarelto had been held during her recent hospitalization due to hematuria. This has since been re-started. Monitor bleeding.    [name] Kitchen HCAP (healthcare-associated pneumonia) 10/25/2014  . History of anticoagulant therapy 10/21/2012  . History of endometriosis 10/2012   OBGYN [facility]: Laparoscopic Endometrial Ablation, TVH,  . History of  recurrent UTIs   . Hypertension   . HYPERTENSION, BENIGN 01/31/2010   Qualifier: Diagnosis of  By: [name], MD, [name] Incontinent of feces 03/11/2016  . Infantile cerebral palsy ([name]) 01/29/2007   Overview:  She has spastic CP.  She is mentally intact.  She is wheelchair bound.  She is wheelchair bound for ambulation      . Intracervical pessary 05/07/2011   Overview:  Overview:  Placed by [facility] 04/30/11 to treat DUB and Endometriosis.  She is seen at GYN clinic at [facility] but was considered a poor surgical candidate and referred her to [facility]  For DUB she had pelvic ultrasound that showed thin stripe and she had endometrial Bx by Dr Jennette Kettle in GYN clinic, which was negative.     . Macroscopic hematuria 03/10/2013   status  post replacement of suprapubic tube 03/04/2013   . Migraine   . OCD (obsessive compulsive disorder)   . Parapneumonic effusion 10/25/2014  . Presence of intrathecal baclofen pump 03/07/2014   R LQ. Insertion T10.    [name] Kitchen Psychiatric illness 10/31/2014  . Pulmonary embolism ([name])    Lifetime Coumadin  . Pulmonary embolism ([name]) 2011, 01/2011   Will be on lifetime coumadin  . Recurrent pulmonary embolism ([name]) 05/03/2011   Pt has history of recurrent PE and is on chronic coumadin.  Pt has been complaining of dyspnea that she describes as chest tightness.  She has been to the ER multiple times for this.  Usually she gets CTA and serial CE.  She has had at least 6 CTA in a period of since 2011.  She also has an O2 requirement. It is unclear why pt feels dyspneic, complains of chest tightness, or has O2 requirement.  I thought that her complaint may be from deconditioning and referred her to PT.  Unfortunately Medicaid only pays for 4 PT visits.  Pt should be doing these exercises at home, but she has not.  I have referred her to [facility], and she has been seen by [name].  He did not understand the etiology of her dyspnea and O2 requirement.  He will continue to follow her.      . Seborrheic keratosis 01/18/2011  . Seizures ([name]) 02/12/2016  . Spasticity 01/05/2014  . Transient alteration of awareness, recurrent 09/08/2006   Recurrent episodes where Javonne  loses contact with her world and that have been studied thoroughly and appear nonepileptic in nature per Dr Terrace Arabia (neurology) The patient has had episodes starting in 2007 that initially began 1-4 times a day during which time she would have periods of unawareness followed by confusion. This continuous video EEG monitoring performed from October 8-12, 2007. Had   . Urethra dilated and patulous 07/03/2015   Patulous, Dilated Urethra. 5mL balloon on a 22-Fr catheter hoping this would prevent the bladder spasm from pushing the balloon down the urethra (Dr Logan Bores, Urology [facility], July 2016)     Patient Active Problem List   Diagnosis Date Noted  . Incontinent of feces 03/11/2016  . Chronic anticoagulation 03/11/2016  . Involuntary movements 02/22/2016  . Seizures ([name]) 02/12/2016  . Dyspnea 02/12/2016  . Cerebral palsy, quadriplegic ([name]) 02/06/2016  . Swelling of extremity 01/13/2016  . Pressure ulcer 10/09/2015  . Status post insertion of inferior vena caval filter 09/28/2015  . Absence of bladder continence 08/25/2015  . Person living in residential institution 06/14/2015  . Pulmonary hypertension ([name])   . Anemia of chronic disease   . Spasticity 03/15/2015  . Constipation, chronic 10/10/2014  . Athetoid cerebral palsy ([name]) 04/13/2014  . Dystonia 04/12/2014  . Presence of intrathecal baclofen pump 03/07/2014  . Dental caries 07/18/2013  . Multiple thyroid nodules 05/20/2013  . Osteoarthrosis 04/07/2013  . [name]   . Dysarthria   . GERD (gastroesophageal reflux disease)   . Neurogenic bladder 02/05/2013  . Anxiety state 10/21/2012  . Chronic pain syndrome 06/17/2012  . Recurrent pulmonary embolism ([name]) 05/03/2011  . Pulmonary embolism with infarction ([name]) 05/03/2011  .  Chronic suprapubic catheter ([name]) 03/15/2011  . Disease of female genital organs 10/24/2010  . HYPERTENSION, BENIGN 01/31/2010  . CP (cerebral palsy), spastic ([name]) 12/27/2008  . Contracted, joint, multiple sites 12/27/2008  . Major depressive disorder, recurrent episode ([name]) 01/29/2007  . Obsessive Compulsive Disorder 01/29/2007  . Transient alteration  of awareness, recurrent 09/08/2006    Past Surgical History:  Procedure Laterality Date  . ABDOMINAL HYSTERECTOMY    . APPENDECTOMY    . BACLOFEN PUMP REFILL     x 3 times  . CARPAL TUNNEL RELEASE  08/2008   Dr Teressa SenterSypher  . CESAREAN SECTION     x 2  . CHOLECYSTECTOMY  10/14/2015   Procedure: LAPAROSCOPIC CHOLECYSTECTOMY;  Surgeon: Violeta GelinasBurke Thompson, MD;  [facility];  Service: General;;  . COLPOSCOPY  06/2000  . INTRAUTERINE DEVICE INSERTION  04/30/11   [facility]  . laparoscopically assisted Vag Hysterectomy  10/27/2012  . MULTIPLE EXTRACTIONS WITH ALVEOLOPLASTY Bilateral 07/19/2013   Procedure: EXTRACTIONS #4, 9,14,78,295,11,16,19;  Surgeon: Francene Findershristopher L Inwood, DDS;  [facility];  Service: Oral Surgery;  Laterality: Bilateral;  . NEUROMA SURGERY Left    anterior and posterior intersosseous  . PAIN PUMP IMPLANTATION N/A 03/07/2014   Procedure: baclofen pump revision/replacement and Catheter connection replacement;  Surgeon: Cristi LoronJeffrey D Jenkins, MD;  Location: [facility] NEURO ORS;  Service: Neurosurgery;  Laterality: N/A;  baclofen pump revision/replacement and Catheter connection replacement  . PROGRAMABLE BACLOFEN PUMP REVISION  03/17/14   Battery Replacement   . TUBAL LIGATION  2003  . URETHRA SURGERY    . WRIST SURGERY  06/2010   Dr Dierdre SearlesLi[facility]    [facility] History    No data available       Home Medications    Prior to Admission medications   Medication Sig Start Date End Date Taking? Authorizing Provider  acetaminophen (TYLENOL) 325 MG tablet Take 650 mg by mouth daily as needed for moderate  pain.     [provider]  amantadine (SYMMETREL) 100 MG capsule Take 100 mg by mouth daily.     [provider]  antiseptic oral rinse (BIOTENE) LIQD 15 mLs by Mouth Rinse route as needed for dry mouth.    [provider]  baclofen (LIORESAL) 20 MG tablet Take 20 mg by mouth 4 (four) times daily. For bladder spasms    [provider]  bisacodyl (DULCOLAX) 10 MG suppository Place 10 mg rectally as needed for moderate constipation.    [provider]  cephALEXin (KEFLEX) 500 MG capsule Take 1 capsule (500 mg total) by mouth 2 (two) times daily. 06/13/17   Riko Lumsden, PA-C  clonazePAM (KLONOPIN) 0.5 MG tablet Take 0.5 mg by mouth 3 (three) times daily.     [provider]  FLUoxetine HCl 60 MG TABS Take 60 mg by mouth daily.     [provider]  lactose free nutrition (BOOST) LIQD Take 237 mLs by mouth 2 (two) times daily between meals.    [provider]  lamoTRIgine (LAMICTAL) 100 MG tablet Take 100 mg by mouth at bedtime.    [provider]  lubiprostone (AMITIZA) 24 MCG capsule Take 1 capsule (24 mcg total) by mouth 2 (two) times daily with a meal. 01/01/17   Esterwood, [name] S, PA-C  Melatonin 5 MG TABS Take 5 mg by mouth at bedtime.     [provider]  Multiple Vitamins-Minerals (CERTA-VITE PO) Take 1 tablet by mouth daily.    [provider]  oxybutynin (DITROPAN) 5 MG tablet Take 5 mg by mouth 4 (four) times daily.  02/20/16   [provider]  oxyCODONE (ROXICODONE) 5 MG immediate release tablet Take one tablet by mouth every 4 hours as needed for pain 07/10/16   Kimber RelicGreen, Arthur G,  MD  polyethylene glycol powder (GLYCOLAX/MIRALAX) powder Take 1  1/2 dose  daily in 12 ounces of fluid every day. Patient taking differently: Take 17 g by mouth daily. Mix with 4-8 oz of liquid daily 01/01/17   Esterwood, [name] S, PA-C  QUEtiapine (SEROQUEL) 100 MG tablet Take 1 tablet (100 mg total) by mouth at  bedtime. 06/21/15   Tyrone Nine, MD  tiZANidine (ZANAFLEX) 4 MG capsule Take 4 mg by mouth 2 (two) times daily.     [provider]  tretinoin (RETIN-A) 0.05 % cream Apply 1 application topically at bedtime. face    [provider]  warfarin (COUMADIN) 6 MG tablet Take 6 mg by mouth daily at 6 PM.    [provider]    Family History Family History  Problem Relation Age of Onset  . Asthma Father   . Colon cancer Maternal Grandmother        Died in her 42's  . Breast cancer Paternal Grandmother        Died in her 55's  . Heart attack Maternal Grandfather        Died in his 2's  . Alzheimer's disease Paternal Grandfather        Died in his 10's  . Stomach cancer Neg Hx     Social History Social History  Substance Use Topics  . Smoking status: Never Smoker  . Smokeless tobacco: Never Used  . Alcohol use 0.6 oz/week    1 Glasses of wine per week     Comment: Drinks alcohol once a month.     Allergies   Morphine   Review of Systems Review of Systems  Constitutional: Negative for appetite change, chills and fever.  HENT: Negative for ear pain, rhinorrhea, sneezing and sore throat.   Eyes: Negative for photophobia and visual disturbance.  Respiratory: Negative for cough, chest tightness, shortness of breath and wheezing.   Cardiovascular: Negative for chest pain and palpitations.  Gastrointestinal: Positive for abdominal pain. Negative for blood in stool, constipation, diarrhea, nausea and vomiting.  Genitourinary: Positive for decreased urine volume and difficulty urinating. Negative for dysuria, hematuria and urgency.  Musculoskeletal: Negative for myalgias.  Skin: Negative for rash.  Neurological: Negative for dizziness, weakness and light-headedness.     Physical Exam Updated Vital Signs BP 116/85   Pulse (!) 111   Temp 98.5 F (36.9 C) (Axillary)   Resp 17   LMP 04/05/2011   SpO2 94%   Physical Exam  Constitutional: She appears  well-developed and well-nourished. No distress.  Thin appearing female, not in acute distress. Bilateral upper extremity contractures. Neck contracture to R side.  HENT:  Head: Normocephalic and atraumatic.  Nose: Nose normal.  Eyes: Conjunctivae and EOM are normal. Left eye exhibits no discharge. No scleral icterus.  Neck: Normal range of motion. Neck supple.  Cardiovascular: Normal rate, regular rhythm, normal heart sounds and intact distal pulses.  Exam reveals no gallop and no friction rub.   No murmur heard. Pulmonary/Chest: Effort normal and breath sounds normal. No respiratory distress.  Abdominal: Soft. Bowel sounds are normal. She exhibits no distension. There is tenderness (Suprapubic). There is no guarding.  Suprapubic catheter in place. 150 mL of urine in the bag.  Musculoskeletal: Normal range of motion. She exhibits no edema.  Neurological: She is alert. She exhibits normal muscle tone. Coordination normal.  Skin: Skin is warm and dry. No rash noted.  Psychiatric: She has a normal mood and affect.  Nursing note  and vitals reviewed.    ED Treatments / Results  Labs (all labs ordered are listed, but only abnormal results are displayed) Labs Reviewed  URINALYSIS, ROUTINE W REFLEX MICROSCOPIC - Abnormal; Notable for the following:       Result Value   Color, Urine AMBER (*)    APPearance TURBID (*)    Hgb urine dipstick MODERATE (*)    Protein, ur 100 (*)    Leukocytes, UA LARGE (*)    Bacteria, UA MANY (*)    All other components within normal limits  COMPREHENSIVE METABOLIC PANEL - Abnormal; Notable for the following:    Sodium 134 (*)    All other components within normal limits  URINE CULTURE  CBC WITH DIFFERENTIAL/PLATELET  I-STAT TROPOININ, ED    EKG  EKG Interpretation None       Radiology No results found.  Procedures BLADDER CATHETERIZATION Date/Time: 06/13/2017 5:51 PM Performed by: Dietrich Pates Authorized by: Dietrich Pates   Consent:     Consent obtained:  Verbal   Consent given by:  Patient   Risks discussed:  False passage, infection, urethral injury and pain Pre-procedure details:    Preparation: Patient was prepped and draped in usual sterile fashion   Anesthesia (see MAR for exact dosages):    Anesthesia method:  None Procedure details:    Provider performed due to:  Complicated insertion (Suprapubic catheter)   Catheter insertion:  Indwelling   Catheter type:  Foley   Catheter size:  20 Fr   Bladder irrigation: no     Number of attempts:  1   Urine characteristics:  Cloudy Post-procedure details:    Patient tolerance of procedure:  Tolerated well, no immediate complications   (including critical care time)  Medications Ordered in ED Medications  HYDROmorphone (DILAUDID) injection 1 mg (1 mg Intravenous Given 06/13/17 1601)  oxyCODONE (Oxy IR/ROXICODONE) immediate release tablet 5 mg (5 mg Oral Given 06/13/17 1817)     Initial Impression / Assessment and Plan / ED Course  I have reviewed the triage vital signs and the nursing notes.  Pertinent labs & imaging results that were available during my care of the patient were reviewed by me and considered in my medical decision making (see chart for details).     Patient presents to ED for suprapubic pain and urinary retention for the past day. She has a suprapubic catheter in place and states that she gets it changed monthly but she is unsure if she got it changed last month. On physical exam she does have suprapubic tenderness to palpation with catheter in place. She is afebrile with no history of fever. She is uncomfortable but nontoxic-appearing and is on acute distress. Catheter was changed here and immediate output of several 100 mL of urine was removed. This was replaced with a 20 [place] and saline and bulb pertinent instructions on catheter. This was changed without problem here in the ED. Urinalysis showed positive leukocytes and bacteria but negative nitrite.  Patient's pain controlled here in the ED and reports relief with changing of catheter. Will send home with Keflex for UTI and advise her to follow-up with PCP and urologist for further evaluation. Patient appears stable for discharge at this time. Should return precautions given.  Final Clinical Impressions(s) / ED Diagnoses   Final diagnoses:  Urinary retention    New Prescriptions New Prescriptions   CEPHALEXIN (KEFLEX) 500 MG CAPSULE    Take 1 capsule (500 mg total) by mouth 2 (two) times daily.  Dietrich Pates, PA-C 06/13/17 1901    Linwood Dibbles, MD 06/14/17 [id number]

## 2017-06-13 NOTE — Discharge Instructions (Signed)
Take Keflex twice daily for 10 days. Follow-up with PCP and urologist for further evaluation. Continue home medications as previously prescribed. Return to ED for severe abdominal pain, blood in urine, vomiting, fevers.

## 2017-06-14 DIAGNOSIS — M24531 Contracture, right wrist: Secondary | ICD-10-CM | POA: Diagnosis not present

## 2017-06-14 DIAGNOSIS — R2681 Unsteadiness on feet: Secondary | ICD-10-CM | POA: Diagnosis not present

## 2017-06-14 DIAGNOSIS — G809 Cerebral palsy, unspecified: Secondary | ICD-10-CM | POA: Diagnosis not present

## 2017-06-14 DIAGNOSIS — R482 Apraxia: Secondary | ICD-10-CM | POA: Diagnosis not present

## 2017-06-14 DIAGNOSIS — M6281 Muscle weakness (generalized): Secondary | ICD-10-CM | POA: Diagnosis not present

## 2017-06-14 DIAGNOSIS — R293 Abnormal posture: Secondary | ICD-10-CM | POA: Diagnosis not present

## 2017-06-15 LAB — URINE CULTURE

## 2017-06-16 DIAGNOSIS — R482 Apraxia: Secondary | ICD-10-CM | POA: Diagnosis not present

## 2017-06-16 DIAGNOSIS — R338 Other retention of urine: Secondary | ICD-10-CM | POA: Diagnosis not present

## 2017-06-16 DIAGNOSIS — M6281 Muscle weakness (generalized): Secondary | ICD-10-CM | POA: Diagnosis not present

## 2017-06-16 DIAGNOSIS — R2681 Unsteadiness on feet: Secondary | ICD-10-CM | POA: Diagnosis not present

## 2017-06-16 DIAGNOSIS — G809 Cerebral palsy, unspecified: Secondary | ICD-10-CM | POA: Diagnosis not present

## 2017-06-16 DIAGNOSIS — M24531 Contracture, right wrist: Secondary | ICD-10-CM | POA: Diagnosis not present

## 2017-06-16 DIAGNOSIS — R293 Abnormal posture: Secondary | ICD-10-CM | POA: Diagnosis not present

## 2017-06-16 DIAGNOSIS — N318 Other neuromuscular dysfunction of bladder: Secondary | ICD-10-CM | POA: Diagnosis not present

## 2017-06-17 DIAGNOSIS — R293 Abnormal posture: Secondary | ICD-10-CM | POA: Diagnosis not present

## 2017-06-17 DIAGNOSIS — R2681 Unsteadiness on feet: Secondary | ICD-10-CM | POA: Diagnosis not present

## 2017-06-17 DIAGNOSIS — G809 Cerebral palsy, unspecified: Secondary | ICD-10-CM | POA: Diagnosis not present

## 2017-06-17 DIAGNOSIS — M6281 Muscle weakness (generalized): Secondary | ICD-10-CM | POA: Diagnosis not present

## 2017-06-17 DIAGNOSIS — M24531 Contracture, right wrist: Secondary | ICD-10-CM | POA: Diagnosis not present

## 2017-06-17 DIAGNOSIS — R482 Apraxia: Secondary | ICD-10-CM | POA: Diagnosis not present

## 2017-06-18 ENCOUNTER — Encounter: Payer: Self-pay | Admitting: Gastroenterology

## 2017-06-18 ENCOUNTER — Ambulatory Visit (INDEPENDENT_AMBULATORY_CARE_PROVIDER_SITE_OTHER): Payer: Medicare Other | Admitting: Gastroenterology

## 2017-06-18 VITALS — BP 90/60 | HR 84

## 2017-06-18 DIAGNOSIS — K5909 Other constipation: Secondary | ICD-10-CM

## 2017-06-18 DIAGNOSIS — G809 Cerebral palsy, unspecified: Secondary | ICD-10-CM | POA: Diagnosis not present

## 2017-06-18 DIAGNOSIS — M24531 Contracture, right wrist: Secondary | ICD-10-CM | POA: Diagnosis not present

## 2017-06-18 DIAGNOSIS — R2681 Unsteadiness on feet: Secondary | ICD-10-CM | POA: Diagnosis not present

## 2017-06-18 DIAGNOSIS — R482 Apraxia: Secondary | ICD-10-CM | POA: Diagnosis not present

## 2017-06-18 DIAGNOSIS — I2690 Septic pulmonary embolism without acute cor pulmonale: Secondary | ICD-10-CM | POA: Diagnosis not present

## 2017-06-18 DIAGNOSIS — R293 Abnormal posture: Secondary | ICD-10-CM | POA: Diagnosis not present

## 2017-06-18 DIAGNOSIS — M6281 Muscle weakness (generalized): Secondary | ICD-10-CM | POA: Diagnosis not present

## 2017-06-18 NOTE — Patient Instructions (Addendum)
Stay on all your current GI medicines at current doses EXCEPT please start taking miralax one dose TWICE DAILY, every day.  You will need a fleets enema if NO BM in three days. This is a standing order to be given anytime you have not had a BM in 3 days.    We will contact [facility] to make sure they understand the above orders.  Normal BMI (Body Mass Index- based on height and weight) is between 19 and 25. Your BMI today is There is no height or weight on file to calculate BMI. [name] Kitchen. Please consider follow up  regarding your BMI with your Primary Care Provider.

## 2017-06-18 NOTE — Progress Notes (Signed)
HPI: This is a  Very pleasant 41 yo woman wheelchair bound due to CP.  Chief complaint is constipation  I have never met her before.  She saw Nicoletta Ba here in our office about 6 months ago and at that time she was recommended to continue Amatine is a twice daily, continue Senokot at bedtime, increase her MiraLAX to 1-1/2 doses daily, perform a fleets enema every 3-4 days if no bowel movement.  She takes oxycodone for neck pains.  She cannot get off the pain meds  She is on an extensive bowel regimen however still doesn't have a good bowel movement very often. She has not had any bowel movement since Friday.  She does not see blood in her stool  She has very obvious limited mobility from her cerebral palsy.  Lives at [place]:   She goes to her doctors appts by herself      Review of systems: Pertinent positive and negative review of systems were noted in the above HPI section. All other review negative.   Past Medical History:  Diagnosis Date  . Abdominal pain 01/02/2012   Overview:  Overview:  Per Previous pcp note- Dr. Ta--Pt has history of endometriosis and had DUB in 2011.  She She had u/s that showed normal endometrial stripe.  She had endometrial bx that was wnl.  She has a lot of abd cramping every month.  This cramping was sometimes not relieved with hydrocodone.  She was referred to [facility] for IUD placement to help endometriosis and abd cramping.  [name]  . Abdominal pain 01/02/2012   Overview:  Overview:  Per Previous pcp note- Dr. Ta--Pt has history of endometriosis and had DUB in 2011.  She She had u/s that showed normal endometrial stripe.  She had endometrial bx that was wnl.  She has a lot of abd cramping every month.  This cramping was sometimes not relieved with hydrocodone.  She was referred to [facility] for IUD placement to help endometriosis and abd cramping.  [name] was placed 04/30/11 by [facility].  Pt still complains of abd cramping, which I am treating  with hydrocodone and ultram. Pt reports some initial relief of pain from IUD, but states it has now returned back to similar to previous cramping. Gave patient a shot of Toradol 60 mg IM today-since positive exacerbation of abdominal cramping during past 2 weeks. Physical exam reassuring. Patient may need to return to [place] for further workup since she is plugged in with the GYN providers there for further treatment options regarding endometriosis and abdominal cramping. The dysfunctional uterine bleeding now well contr  . Acute cystitis with hematuria   . Acute cystitis without hematuria   . Acute respiratory failure with hypoxia ([place])   . Anemia   . Cellulitis 12/22/2014  . Cerebral palsy ([name])    Spastic Cerebral palsy, mentally intact  . Contracture, joint, multiple sites    Electric wheelchair, uses left hand to operate chair.   . Depression   . Disease of female genital organs 10/24/2010   Overview:  Overview:  Pt has history of endometriosis and had DUB in 2011.  She had u/s that showed normal endometrial stripe.  She had endometrial bx that was wnl.  She has a lot of abd cramping every month.  This cramping was sometimes not relieved with hydrocodone.  She was referred to [facility] for IUD placement to help endometriosis and abd cramping.  [name] was placed 04/30/11 by [facility].  Pt  still complains of abd cramping, which was treated with hydrocodone and ultram.   Last seen at [facility] by OB/GYN 12/24/11, given Depo Lupron injection.  Ordering CT scan to r/o additional cause of abdominal pain (pt with known endometriosis and will not tolerate vaginal U/S, pain not improved s/p [name] or with Lupron injection-- only caused hot flashes and amenorrhea, no pain relief).  Also made referral to pain clinic for better pain control.  Do not think hysterectomy would help with pain, and concern for contamination of baclofen pump if did proceed with surgery.    8/5/13[name]: Pt was seen by   .  [name]   . Dysarthria   . Dyspnea and respiratory abnormality 02/04/2011   Overview:  Overview:  Pt has history of recurrent PE and is on chronic coumadin.  Pt has been complaining of dyspnea that she describes as chest tightness.  She has been to the ER multiple times for this.  Usually she gets CTA and serial CE.  She has had at least 6 CTA in a period of since 2011.  She also has an O2 requirement. It is unclear why pt feels dyspneic, complains of chest tightness, or has O2 requirement.  I thought that her complaint may be from deconditioning and referred her to PT.  Unfortunately Medicaid only pays for 4 PT visits.  Pt should be doing these exercises at home, but she has not.  I have referred her to [facility], and she has been seen by Dr Chase Caller.  He did not understand the etiology of her dyspnea and O2 requirement.  He will continue to follow her.   Last Assessment & Plan:  Pt presents with concerns for chest tightness and dyspnea.  She was seen in ED yesterday and discharged home.  While there she had a CT chest 04/20/11 showed 1.  Multifocal degradation as detailed above.  N  . Endometriosis   . GERD (gastroesophageal reflux disease)   . Gram positive sepsis ([place]) 10/24/2014  . Gross hematuria 03/12/2013   Overview:  Overview:  status post replacement of suprapubic tube 03/04/2013  Last Assessment & Plan:  Recent urine culture collected in ED on 5/4 did not result in significant growth. Moreover, patient has been asymptomatic with normal WBC, no fever and no true symptoms making symptomatic UTI less likely.  Will continue to monitor for any sign of infection.  Patient's xarelto had been held during her recent hospitalization due to hematuria. This has since been re-started. Monitor bleeding.    [name] Kitchen HCAP (healthcare-associated pneumonia) 10/25/2014  . History of anticoagulant therapy 10/21/2012  . History of endometriosis 10/2012   OBGYN [facility]: Laparoscopic Endometrial Ablation, TVH,   . History of recurrent UTIs   . Hypertension   . HYPERTENSION, BENIGN 01/31/2010   Qualifier: Diagnosis of  By: [name], MD, [name] Incontinent of feces 03/11/2016  . Infantile cerebral palsy ([place]) 01/29/2007   Overview:  She has spastic CP.  She is mentally intact.  She is wheelchair bound.  She is wheelchair bound for ambulation      . Intracervical pessary 05/07/2011   Overview:  Overview:  Placed by [facility] 04/30/11 to treat DUB and Endometriosis.  She is seen at GYN clinic at [facility] but was considered a poor surgical candidate and referred her to [facility].  For DUB she had pelvic ultrasound that showed thin stripe and she had endometrial Bx by Dr Nori Riis in [place] clinic,  which was negative.     . Macroscopic hematuria 03/10/2013   status post replacement of suprapubic tube 03/04/2013   . Migraine   . OCD (obsessive compulsive disorder)   . Parapneumonic effusion 10/25/2014  . Presence of intrathecal baclofen pump 03/07/2014   R LQ. Insertion T10.    [name] Kitchen Psychiatric illness 10/31/2014  . Pulmonary embolism ([name])    Lifetime Coumadin  . Pulmonary embolism ([place]) 2011, 01/2011   Will be on lifetime coumadin  . Recurrent pulmonary embolism ([place]) 05/03/2011   Pt has history of recurrent PE and is on chronic coumadin.  Pt has been complaining of dyspnea that she describes as chest tightness.  She has been to the ER multiple times for this.  Usually she gets CTA and serial CE.  She has had at least 6 CTA in a period of since 2011.  She also has an O2 requirement. It is unclear why pt feels dyspneic, complains of chest tightness, or has O2 requirement.  I thought that her complaint may be from deconditioning and referred her to PT.  Unfortunately Medicaid only pays for 4 PT visits.  Pt should be doing these exercises at home, but she has not.  I have referred her to [facility], and she has been seen by Dr Chase Caller.  He did not understand the etiology of her dyspnea and O2 requirement.  He will continue  to follow her.     . Seborrheic keratosis 01/18/2011  . Seizures ([place]) 02/12/2016  . Spasticity 01/05/2014  . Transient alteration of awareness, recurrent 09/08/2006   Recurrent episodes where Melanie  loses contact with her world and that have been studied thoroughly and appear nonepileptic in nature per Dr Krista Blue (neurology) The patient has had episodes starting in 2007 that initially began 1-4 times a day during which time she would have periods of unawareness followed by confusion. This continuous video EEG monitoring performed from October 8-12, 2007. Had   . Urethra dilated and patulous 07/03/2015   Patulous, Dilated Urethra. 63m balloon on a 22-Fr catheter hoping this would prevent the bladder spasm from pushing the balloon down the urethra (Dr EAmalia Hailey [facility], July 2016)     Past Surgical History:  Procedure Laterality Date  . ABDOMINAL HYSTERECTOMY    . APPENDECTOMY    . BACLOFEN PUMP REFILL     x 3 times  . CARPAL TUNNEL RELEASE  08/2008   Dr SDaylene Katayama . CESAREAN SECTION     x 2  . CHOLECYSTECTOMY  10/14/2015   Procedure: LAPAROSCOPIC CHOLECYSTECTOMY;  Surgeon: BGeorganna Skeans MD;  [facility] [facility]  Service: General;;  . COLPOSCOPY  06/2000  . INTRAUTERINE DEVICE INSERTION  04/30/11   [place]  . laparoscopically assisted Vag Hysterectomy  10/27/2012  . MULTIPLE EXTRACTIONS WITH ALVEOLOPLASTY Bilateral 07/19/2013   Procedure: EXTRACTIONS #4, 55,57,32,20  Surgeon: CIsac Caddy DDS;  [facility]  Service: Oral Surgery;  Laterality: Bilateral;  . NEUROMA SURGERY Left    anterior and posterior intersosseous  . PAIN PUMP IMPLANTATION N/A 03/07/2014   Procedure: baclofen pump revision/replacement and Catheter connection replacement;  Surgeon: JOphelia Charter MD;  [facility];  Service: Neurosurgery;  Laterality: N/A;  baclofen pump revision/replacement and Catheter connection replacement  . PROGRAMABLE BACLOFEN PUMP REVISION   03/17/14   Battery Replacement   . TUBAL LIGATION  2003  . URETHRA SURGERY    . WRIST SURGERY  06/2010   Dr LNicoletta Dress hand  surgeon, [facility]    Current Outpatient Prescriptions  Medication Sig Dispense Refill  . acetaminophen (TYLENOL) 325 MG tablet Take 650 mg by mouth daily as needed for moderate pain.     [name] Kitchen amantadine (SYMMETREL) 100 MG capsule Take 100 mg by mouth daily.     [name] Kitchen antiseptic oral rinse (BIOTENE) LIQD 15 mLs by Mouth Rinse route as needed for dry mouth.    . baclofen (LIORESAL) 20 MG tablet Take 20 mg by mouth 4 (four) times daily. For bladder spasms    . bisacodyl (DULCOLAX) 10 MG suppository Place 10 mg rectally as needed for moderate constipation.    . cephALEXin (KEFLEX) 500 MG capsule Take 1 capsule (500 mg total) by mouth 2 (two) times daily. 20 capsule 0  . clonazePAM (KLONOPIN) 0.5 MG tablet Take 0.5 mg by mouth 3 (three) times daily.     [name] Kitchen FLUoxetine HCl 60 MG TABS Take 60 mg by mouth daily.     [name] Kitchen lactose free nutrition (BOOST) LIQD Take 237 mLs by mouth 2 (two) times daily between meals.    . lamoTRIgine (LAMICTAL) 100 MG tablet Take 100 mg by mouth at bedtime.    [name] Kitchen lubiprostone (AMITIZA) 24 MCG capsule Take 1 capsule (24 mcg total) by mouth 2 (two) times daily with a meal. 60 capsule 11  . Melatonin 5 MG TABS Take 5 mg by mouth at bedtime.     . Multiple Vitamins-Minerals (CERTA-VITE PO) Take 1 tablet by mouth daily.    [name] Kitchen oxybutynin (DITROPAN) 5 MG tablet Take 5 mg by mouth 4 (four) times daily.     [name] Kitchen oxyCODONE (ROXICODONE) 5 MG immediate release tablet Take one tablet by mouth every 4 hours as needed for pain 180 tablet 0  . polyethylene glycol powder (GLYCOLAX/MIRALAX) powder Take 1  1/2 dose  daily in 12 ounces of fluid every day. (Patient taking differently: Take 17 g by mouth daily. Mix with 4-8 oz of liquid daily) 255 g 3  . QUEtiapine (SEROQUEL) 100 MG tablet Take 1 tablet (100 mg total) by mouth at bedtime.    [name] Kitchen tiZANidine (ZANAFLEX) 4 MG capsule Take 4 mg by  mouth 2 (two) times daily.     [name] Kitchen tretinoin (RETIN-A) 0.05 % cream Apply 1 application topically at bedtime. face    . warfarin (COUMADIN) 6 MG tablet Take 6 mg by mouth daily at 6 PM.     No current facility-administered medications for this visit.     Allergies as of 06/18/2017 - Review Complete 06/18/2017  Allergen Reaction Noted  . Morphine Dermatitis and Other (See Comments) 03/09/2013    Family History  Problem Relation Age of Onset  . Asthma Father   . Colon cancer Maternal Grandmother        Died in her 63's  . Breast cancer Paternal Grandmother        Died in her 30's  . Heart attack Maternal Grandfather        Died in his 52's  . Alzheimer's disease Paternal Grandfather        Died in his 36's  . Stomach cancer Neg Hx     Social History   Social History  . Marital status: Divorced    Spouse name: N/A  . Number of children: 2  . Years of education: college   Occupational History  .  Unemployed   Social History Main Topics  . Smoking status: Never Smoker  . Smokeless tobacco: Never Used  . Alcohol use  0.6 oz/week    1 Glasses of wine per week     Comment: Drinks alcohol once a month.  . Drug use: No  . Sexual activity: No   Other Topics Concern  . 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    She enjoys shopping.   Lives in a [place] since 02/11/16      Delrae Sawyers, who has schizophrenia, MR, is father of daughter.  Pilar Plate and pt are no longer together.  Pilar Plate does not see Cleotis Lema. Daughter is 43 yo, Forensic scientist, lives with pt.        Marcello Moores [phone]), lives with pt's parents.      Needs assistance with ADL's and IADL's--has personal care services 20 hrs/wk;       No tobacco, EtOH, drugs.      **Patient very concerned about her kids being taken from her.      **Case Manager: Maia Petties [phone]      Patient lives  [facility]     Physical Exam: BP 90/60 (BP Location: Left Arm, Patient Position: Sitting, Cuff Size: Normal)   Pulse 84   LMP 04/05/2011  Psychiatric: alert and oriented x3 She is wheelchair bound, contorted from her cerebral palsy. It is hard to understand her very well but I do think that she is alert and oriented 3  Assessment and plan: 41 y.o. female with  wheelchair bound cerebral palsy, chronic constipation  She will continue on her current bowel regimen and I recommended that she increase her MiraLAX to one dose twice daily. She will also be given an Fleet's enema if she has no bowel movement within 3 days. We will call [facility] directly to make sure that they understand these are doctors orders.    Please see the "Patient Instructions" section for addition details about the plan.   Owens Loffler, MD [facility] 06/18/2017, 9:50 AM  Cc:

## 2017-06-19 DIAGNOSIS — R2681 Unsteadiness on feet: Secondary | ICD-10-CM | POA: Diagnosis not present

## 2017-06-19 DIAGNOSIS — G809 Cerebral palsy, unspecified: Secondary | ICD-10-CM | POA: Diagnosis not present

## 2017-06-19 DIAGNOSIS — R482 Apraxia: Secondary | ICD-10-CM | POA: Diagnosis not present

## 2017-06-19 DIAGNOSIS — M24531 Contracture, right wrist: Secondary | ICD-10-CM | POA: Diagnosis not present

## 2017-06-19 DIAGNOSIS — R293 Abnormal posture: Secondary | ICD-10-CM | POA: Diagnosis not present

## 2017-06-19 DIAGNOSIS — M6281 Muscle weakness (generalized): Secondary | ICD-10-CM | POA: Diagnosis not present

## 2017-06-20 DIAGNOSIS — M6281 Muscle weakness (generalized): Secondary | ICD-10-CM | POA: Diagnosis not present

## 2017-06-20 DIAGNOSIS — G809 Cerebral palsy, unspecified: Secondary | ICD-10-CM | POA: Diagnosis not present

## 2017-06-20 DIAGNOSIS — I2690 Septic pulmonary embolism without acute cor pulmonale: Secondary | ICD-10-CM | POA: Diagnosis not present

## 2017-06-20 DIAGNOSIS — R482 Apraxia: Secondary | ICD-10-CM | POA: Diagnosis not present

## 2017-06-20 DIAGNOSIS — A419 Sepsis, unspecified organism: Secondary | ICD-10-CM | POA: Diagnosis not present

## 2017-06-20 DIAGNOSIS — M24531 Contracture, right wrist: Secondary | ICD-10-CM | POA: Diagnosis not present

## 2017-06-20 DIAGNOSIS — R293 Abnormal posture: Secondary | ICD-10-CM | POA: Diagnosis not present

## 2017-06-20 DIAGNOSIS — R609 Edema, unspecified: Secondary | ICD-10-CM | POA: Diagnosis not present

## 2017-06-20 DIAGNOSIS — R2681 Unsteadiness on feet: Secondary | ICD-10-CM | POA: Diagnosis not present

## 2017-06-22 DIAGNOSIS — M24531 Contracture, right wrist: Secondary | ICD-10-CM | POA: Diagnosis not present

## 2017-06-22 DIAGNOSIS — R2681 Unsteadiness on feet: Secondary | ICD-10-CM | POA: Diagnosis not present

## 2017-06-22 DIAGNOSIS — R482 Apraxia: Secondary | ICD-10-CM | POA: Diagnosis not present

## 2017-06-22 DIAGNOSIS — R293 Abnormal posture: Secondary | ICD-10-CM | POA: Diagnosis not present

## 2017-06-22 DIAGNOSIS — G809 Cerebral palsy, unspecified: Secondary | ICD-10-CM | POA: Diagnosis not present

## 2017-06-22 DIAGNOSIS — M6281 Muscle weakness (generalized): Secondary | ICD-10-CM | POA: Diagnosis not present

## 2017-06-23 ENCOUNTER — Telehealth (INDEPENDENT_AMBULATORY_CARE_PROVIDER_SITE_OTHER): Payer: Self-pay | Admitting: Orthopaedic Surgery

## 2017-06-23 DIAGNOSIS — R482 Apraxia: Secondary | ICD-10-CM | POA: Diagnosis not present

## 2017-06-23 DIAGNOSIS — M24531 Contracture, right wrist: Secondary | ICD-10-CM | POA: Diagnosis not present

## 2017-06-23 DIAGNOSIS — G809 Cerebral palsy, unspecified: Secondary | ICD-10-CM | POA: Diagnosis not present

## 2017-06-23 DIAGNOSIS — R2681 Unsteadiness on feet: Secondary | ICD-10-CM | POA: Diagnosis not present

## 2017-06-23 DIAGNOSIS — R293 Abnormal posture: Secondary | ICD-10-CM | POA: Diagnosis not present

## 2017-06-23 DIAGNOSIS — M6281 Muscle weakness (generalized): Secondary | ICD-10-CM | POA: Diagnosis not present

## 2017-06-23 DIAGNOSIS — D509 Iron deficiency anemia, unspecified: Secondary | ICD-10-CM | POA: Diagnosis not present

## 2017-06-23 NOTE — Telephone Encounter (Signed)
PT CALLED ASKING FOR A CALL BACK FROM [facility]

## 2017-06-23 NOTE — Telephone Encounter (Signed)
I spoke with patient. She states that she is very confused. We told her that she had a lot of problems with her neck and referred her out for surgery. The neurosurgeon that she is seeing is sending her to physical therapy which she states is making the pain worse. She is having to take three pain pills a day and is very concerned that this is only going to get worse in the future. She states that she called the office we referred her to and explained she was getting worse with therapy and the nurse told her she needed to continue it. She would like to know what else can be done.  I did explain to the patient that Dr. Ophelia CharterYates is in surgery this afternoon and it would be tomorrow before I could call her back.

## 2017-06-23 NOTE — Telephone Encounter (Signed)
Fyi.[name] Kitchen.[name] Kitchen.please see message from patient. She is seeing Dr. Diamantina ProvidenceAlexander Powers, Neurosurgery, per our referral.

## 2017-06-24 DIAGNOSIS — M6281 Muscle weakness (generalized): Secondary | ICD-10-CM | POA: Diagnosis not present

## 2017-06-24 DIAGNOSIS — R293 Abnormal posture: Secondary | ICD-10-CM | POA: Diagnosis not present

## 2017-06-24 DIAGNOSIS — G809 Cerebral palsy, unspecified: Secondary | ICD-10-CM | POA: Diagnosis not present

## 2017-06-24 DIAGNOSIS — R2681 Unsteadiness on feet: Secondary | ICD-10-CM | POA: Diagnosis not present

## 2017-06-24 DIAGNOSIS — R482 Apraxia: Secondary | ICD-10-CM | POA: Diagnosis not present

## 2017-06-24 DIAGNOSIS — M24531 Contracture, right wrist: Secondary | ICD-10-CM | POA: Diagnosis not present

## 2017-06-24 NOTE — Telephone Encounter (Signed)
Not sure what to do to help her except  Have her follow tx plan  with dr. Lorenso CourierPowers.

## 2017-06-25 DIAGNOSIS — R2681 Unsteadiness on feet: Secondary | ICD-10-CM | POA: Diagnosis not present

## 2017-06-25 DIAGNOSIS — M24531 Contracture, right wrist: Secondary | ICD-10-CM | POA: Diagnosis not present

## 2017-06-25 DIAGNOSIS — G808 Other cerebral palsy: Secondary | ICD-10-CM | POA: Diagnosis not present

## 2017-06-25 DIAGNOSIS — R482 Apraxia: Secondary | ICD-10-CM | POA: Diagnosis not present

## 2017-06-25 DIAGNOSIS — M6281 Muscle weakness (generalized): Secondary | ICD-10-CM | POA: Diagnosis not present

## 2017-06-25 DIAGNOSIS — F418 Other specified anxiety disorders: Secondary | ICD-10-CM | POA: Diagnosis not present

## 2017-06-25 DIAGNOSIS — D509 Iron deficiency anemia, unspecified: Secondary | ICD-10-CM | POA: Diagnosis not present

## 2017-06-25 DIAGNOSIS — I2699 Other pulmonary embolism without acute cor pulmonale: Secondary | ICD-10-CM | POA: Diagnosis not present

## 2017-06-25 DIAGNOSIS — G8929 Other chronic pain: Secondary | ICD-10-CM | POA: Diagnosis not present

## 2017-06-25 DIAGNOSIS — G809 Cerebral palsy, unspecified: Secondary | ICD-10-CM | POA: Diagnosis not present

## 2017-06-25 DIAGNOSIS — R293 Abnormal posture: Secondary | ICD-10-CM | POA: Diagnosis not present

## 2017-06-25 NOTE — Telephone Encounter (Signed)
I left voicemail for patient advising. 

## 2017-06-26 DIAGNOSIS — M24531 Contracture, right wrist: Secondary | ICD-10-CM | POA: Diagnosis not present

## 2017-06-26 DIAGNOSIS — R293 Abnormal posture: Secondary | ICD-10-CM | POA: Diagnosis not present

## 2017-06-26 DIAGNOSIS — R2681 Unsteadiness on feet: Secondary | ICD-10-CM | POA: Diagnosis not present

## 2017-06-26 DIAGNOSIS — R482 Apraxia: Secondary | ICD-10-CM | POA: Diagnosis not present

## 2017-06-26 DIAGNOSIS — M6281 Muscle weakness (generalized): Secondary | ICD-10-CM | POA: Diagnosis not present

## 2017-06-26 DIAGNOSIS — G809 Cerebral palsy, unspecified: Secondary | ICD-10-CM | POA: Diagnosis not present

## 2017-06-27 DIAGNOSIS — M24531 Contracture, right wrist: Secondary | ICD-10-CM | POA: Diagnosis not present

## 2017-06-27 DIAGNOSIS — M6281 Muscle weakness (generalized): Secondary | ICD-10-CM | POA: Diagnosis not present

## 2017-06-27 DIAGNOSIS — G809 Cerebral palsy, unspecified: Secondary | ICD-10-CM | POA: Diagnosis not present

## 2017-06-27 DIAGNOSIS — R482 Apraxia: Secondary | ICD-10-CM | POA: Diagnosis not present

## 2017-06-27 DIAGNOSIS — R293 Abnormal posture: Secondary | ICD-10-CM | POA: Diagnosis not present

## 2017-06-27 DIAGNOSIS — R2681 Unsteadiness on feet: Secondary | ICD-10-CM | POA: Diagnosis not present

## 2017-06-30 DIAGNOSIS — R482 Apraxia: Secondary | ICD-10-CM | POA: Diagnosis not present

## 2017-06-30 DIAGNOSIS — M24531 Contracture, right wrist: Secondary | ICD-10-CM | POA: Diagnosis not present

## 2017-06-30 DIAGNOSIS — M6281 Muscle weakness (generalized): Secondary | ICD-10-CM | POA: Diagnosis not present

## 2017-06-30 DIAGNOSIS — G809 Cerebral palsy, unspecified: Secondary | ICD-10-CM | POA: Diagnosis not present

## 2017-06-30 DIAGNOSIS — R2681 Unsteadiness on feet: Secondary | ICD-10-CM | POA: Diagnosis not present

## 2017-06-30 DIAGNOSIS — R293 Abnormal posture: Secondary | ICD-10-CM | POA: Diagnosis not present

## 2017-07-01 ENCOUNTER — Ambulatory Visit: Payer: Medicare Other | Admitting: Physical Medicine & Rehabilitation

## 2017-07-01 ENCOUNTER — Encounter: Payer: Medicare Other | Attending: Physical Medicine & Rehabilitation

## 2017-07-01 DIAGNOSIS — M24531 Contracture, right wrist: Secondary | ICD-10-CM | POA: Diagnosis not present

## 2017-07-01 DIAGNOSIS — G809 Cerebral palsy, unspecified: Secondary | ICD-10-CM | POA: Diagnosis not present

## 2017-07-01 DIAGNOSIS — M6281 Muscle weakness (generalized): Secondary | ICD-10-CM | POA: Diagnosis not present

## 2017-07-01 DIAGNOSIS — R252 Cramp and spasm: Secondary | ICD-10-CM | POA: Insufficient documentation

## 2017-07-01 DIAGNOSIS — R293 Abnormal posture: Secondary | ICD-10-CM | POA: Diagnosis not present

## 2017-07-01 DIAGNOSIS — R482 Apraxia: Secondary | ICD-10-CM | POA: Diagnosis not present

## 2017-07-01 DIAGNOSIS — R2681 Unsteadiness on feet: Secondary | ICD-10-CM | POA: Diagnosis not present

## 2017-07-02 DIAGNOSIS — G808 Other cerebral palsy: Secondary | ICD-10-CM | POA: Diagnosis not present

## 2017-07-02 DIAGNOSIS — R293 Abnormal posture: Secondary | ICD-10-CM | POA: Diagnosis not present

## 2017-07-02 DIAGNOSIS — R2681 Unsteadiness on feet: Secondary | ICD-10-CM | POA: Diagnosis not present

## 2017-07-02 DIAGNOSIS — R498 Other voice and resonance disorders: Secondary | ICD-10-CM | POA: Diagnosis not present

## 2017-07-02 DIAGNOSIS — M24531 Contracture, right wrist: Secondary | ICD-10-CM | POA: Diagnosis not present

## 2017-07-02 DIAGNOSIS — M6281 Muscle weakness (generalized): Secondary | ICD-10-CM | POA: Diagnosis not present

## 2017-07-02 DIAGNOSIS — R278 Other lack of coordination: Secondary | ICD-10-CM | POA: Diagnosis not present

## 2017-07-02 DIAGNOSIS — R482 Apraxia: Secondary | ICD-10-CM | POA: Diagnosis not present

## 2017-07-03 DIAGNOSIS — R2681 Unsteadiness on feet: Secondary | ICD-10-CM | POA: Diagnosis not present

## 2017-07-03 DIAGNOSIS — R278 Other lack of coordination: Secondary | ICD-10-CM | POA: Diagnosis not present

## 2017-07-03 DIAGNOSIS — G808 Other cerebral palsy: Secondary | ICD-10-CM | POA: Diagnosis not present

## 2017-07-03 DIAGNOSIS — M6281 Muscle weakness (generalized): Secondary | ICD-10-CM | POA: Diagnosis not present

## 2017-07-03 DIAGNOSIS — R498 Other voice and resonance disorders: Secondary | ICD-10-CM | POA: Diagnosis not present

## 2017-07-03 DIAGNOSIS — R293 Abnormal posture: Secondary | ICD-10-CM | POA: Diagnosis not present

## 2017-07-04 DIAGNOSIS — M6281 Muscle weakness (generalized): Secondary | ICD-10-CM | POA: Diagnosis not present

## 2017-07-04 DIAGNOSIS — R2681 Unsteadiness on feet: Secondary | ICD-10-CM | POA: Diagnosis not present

## 2017-07-04 DIAGNOSIS — R278 Other lack of coordination: Secondary | ICD-10-CM | POA: Diagnosis not present

## 2017-07-04 DIAGNOSIS — G808 Other cerebral palsy: Secondary | ICD-10-CM | POA: Diagnosis not present

## 2017-07-04 DIAGNOSIS — R293 Abnormal posture: Secondary | ICD-10-CM | POA: Diagnosis not present

## 2017-07-04 DIAGNOSIS — R498 Other voice and resonance disorders: Secondary | ICD-10-CM | POA: Diagnosis not present

## 2017-07-05 DIAGNOSIS — R498 Other voice and resonance disorders: Secondary | ICD-10-CM | POA: Diagnosis not present

## 2017-07-05 DIAGNOSIS — R293 Abnormal posture: Secondary | ICD-10-CM | POA: Diagnosis not present

## 2017-07-05 DIAGNOSIS — M6281 Muscle weakness (generalized): Secondary | ICD-10-CM | POA: Diagnosis not present

## 2017-07-05 DIAGNOSIS — R278 Other lack of coordination: Secondary | ICD-10-CM | POA: Diagnosis not present

## 2017-07-05 DIAGNOSIS — G808 Other cerebral palsy: Secondary | ICD-10-CM | POA: Diagnosis not present

## 2017-07-05 DIAGNOSIS — R2681 Unsteadiness on feet: Secondary | ICD-10-CM | POA: Diagnosis not present

## 2017-07-07 DIAGNOSIS — M6281 Muscle weakness (generalized): Secondary | ICD-10-CM | POA: Diagnosis not present

## 2017-07-07 DIAGNOSIS — R2681 Unsteadiness on feet: Secondary | ICD-10-CM | POA: Diagnosis not present

## 2017-07-07 DIAGNOSIS — G808 Other cerebral palsy: Secondary | ICD-10-CM | POA: Diagnosis not present

## 2017-07-07 DIAGNOSIS — R293 Abnormal posture: Secondary | ICD-10-CM | POA: Diagnosis not present

## 2017-07-07 DIAGNOSIS — R278 Other lack of coordination: Secondary | ICD-10-CM | POA: Diagnosis not present

## 2017-07-07 DIAGNOSIS — R498 Other voice and resonance disorders: Secondary | ICD-10-CM | POA: Diagnosis not present

## 2017-07-08 DIAGNOSIS — R293 Abnormal posture: Secondary | ICD-10-CM | POA: Diagnosis not present

## 2017-07-08 DIAGNOSIS — G808 Other cerebral palsy: Secondary | ICD-10-CM | POA: Diagnosis not present

## 2017-07-08 DIAGNOSIS — R498 Other voice and resonance disorders: Secondary | ICD-10-CM | POA: Diagnosis not present

## 2017-07-08 DIAGNOSIS — R2681 Unsteadiness on feet: Secondary | ICD-10-CM | POA: Diagnosis not present

## 2017-07-08 DIAGNOSIS — M6281 Muscle weakness (generalized): Secondary | ICD-10-CM | POA: Diagnosis not present

## 2017-07-08 DIAGNOSIS — R278 Other lack of coordination: Secondary | ICD-10-CM | POA: Diagnosis not present

## 2017-07-09 DIAGNOSIS — R498 Other voice and resonance disorders: Secondary | ICD-10-CM | POA: Diagnosis not present

## 2017-07-09 DIAGNOSIS — R293 Abnormal posture: Secondary | ICD-10-CM | POA: Diagnosis not present

## 2017-07-09 DIAGNOSIS — M6281 Muscle weakness (generalized): Secondary | ICD-10-CM | POA: Diagnosis not present

## 2017-07-09 DIAGNOSIS — R2681 Unsteadiness on feet: Secondary | ICD-10-CM | POA: Diagnosis not present

## 2017-07-09 DIAGNOSIS — G808 Other cerebral palsy: Secondary | ICD-10-CM | POA: Diagnosis not present

## 2017-07-09 DIAGNOSIS — R278 Other lack of coordination: Secondary | ICD-10-CM | POA: Diagnosis not present

## 2017-07-10 DIAGNOSIS — G808 Other cerebral palsy: Secondary | ICD-10-CM | POA: Diagnosis not present

## 2017-07-10 DIAGNOSIS — R278 Other lack of coordination: Secondary | ICD-10-CM | POA: Diagnosis not present

## 2017-07-10 DIAGNOSIS — R498 Other voice and resonance disorders: Secondary | ICD-10-CM | POA: Diagnosis not present

## 2017-07-10 DIAGNOSIS — M6281 Muscle weakness (generalized): Secondary | ICD-10-CM | POA: Diagnosis not present

## 2017-07-10 DIAGNOSIS — R2681 Unsteadiness on feet: Secondary | ICD-10-CM | POA: Diagnosis not present

## 2017-07-10 DIAGNOSIS — R293 Abnormal posture: Secondary | ICD-10-CM | POA: Diagnosis not present

## 2017-07-11 DIAGNOSIS — Z0000 Encounter for general adult medical examination without abnormal findings: Secondary | ICD-10-CM | POA: Diagnosis not present

## 2017-07-11 DIAGNOSIS — G808 Other cerebral palsy: Secondary | ICD-10-CM | POA: Diagnosis not present

## 2017-07-11 DIAGNOSIS — R498 Other voice and resonance disorders: Secondary | ICD-10-CM | POA: Diagnosis not present

## 2017-07-11 DIAGNOSIS — R278 Other lack of coordination: Secondary | ICD-10-CM | POA: Diagnosis not present

## 2017-07-11 DIAGNOSIS — M6281 Muscle weakness (generalized): Secondary | ICD-10-CM | POA: Diagnosis not present

## 2017-07-11 DIAGNOSIS — R2681 Unsteadiness on feet: Secondary | ICD-10-CM | POA: Diagnosis not present

## 2017-07-11 DIAGNOSIS — R293 Abnormal posture: Secondary | ICD-10-CM | POA: Diagnosis not present

## 2017-07-14 DIAGNOSIS — R278 Other lack of coordination: Secondary | ICD-10-CM | POA: Diagnosis not present

## 2017-07-14 DIAGNOSIS — M6281 Muscle weakness (generalized): Secondary | ICD-10-CM | POA: Diagnosis not present

## 2017-07-14 DIAGNOSIS — R293 Abnormal posture: Secondary | ICD-10-CM | POA: Diagnosis not present

## 2017-07-14 DIAGNOSIS — R498 Other voice and resonance disorders: Secondary | ICD-10-CM | POA: Diagnosis not present

## 2017-07-14 DIAGNOSIS — R2681 Unsteadiness on feet: Secondary | ICD-10-CM | POA: Diagnosis not present

## 2017-07-14 DIAGNOSIS — G808 Other cerebral palsy: Secondary | ICD-10-CM | POA: Diagnosis not present

## 2017-07-15 ENCOUNTER — Telehealth: Payer: Self-pay | Admitting: Gastroenterology

## 2017-07-15 DIAGNOSIS — G808 Other cerebral palsy: Secondary | ICD-10-CM | POA: Diagnosis not present

## 2017-07-15 DIAGNOSIS — R293 Abnormal posture: Secondary | ICD-10-CM | POA: Diagnosis not present

## 2017-07-15 DIAGNOSIS — R278 Other lack of coordination: Secondary | ICD-10-CM | POA: Diagnosis not present

## 2017-07-15 DIAGNOSIS — R2681 Unsteadiness on feet: Secondary | ICD-10-CM | POA: Diagnosis not present

## 2017-07-15 DIAGNOSIS — R498 Other voice and resonance disorders: Secondary | ICD-10-CM | POA: Diagnosis not present

## 2017-07-15 DIAGNOSIS — M6281 Muscle weakness (generalized): Secondary | ICD-10-CM | POA: Diagnosis not present

## 2017-07-15 NOTE — Telephone Encounter (Signed)
Left message on machine to call back  

## 2017-07-16 DIAGNOSIS — G808 Other cerebral palsy: Secondary | ICD-10-CM | POA: Diagnosis not present

## 2017-07-16 DIAGNOSIS — R278 Other lack of coordination: Secondary | ICD-10-CM | POA: Diagnosis not present

## 2017-07-16 DIAGNOSIS — R293 Abnormal posture: Secondary | ICD-10-CM | POA: Diagnosis not present

## 2017-07-16 DIAGNOSIS — G8929 Other chronic pain: Secondary | ICD-10-CM | POA: Diagnosis not present

## 2017-07-16 DIAGNOSIS — R2681 Unsteadiness on feet: Secondary | ICD-10-CM | POA: Diagnosis not present

## 2017-07-16 DIAGNOSIS — M6281 Muscle weakness (generalized): Secondary | ICD-10-CM | POA: Diagnosis not present

## 2017-07-16 DIAGNOSIS — R498 Other voice and resonance disorders: Secondary | ICD-10-CM | POA: Diagnosis not present

## 2017-07-16 NOTE — Telephone Encounter (Signed)
[facility] this call was placed in error.  No need to return call

## 2017-07-17 DIAGNOSIS — R278 Other lack of coordination: Secondary | ICD-10-CM | POA: Diagnosis not present

## 2017-07-17 DIAGNOSIS — R498 Other voice and resonance disorders: Secondary | ICD-10-CM | POA: Diagnosis not present

## 2017-07-17 DIAGNOSIS — M6281 Muscle weakness (generalized): Secondary | ICD-10-CM | POA: Diagnosis not present

## 2017-07-17 DIAGNOSIS — R293 Abnormal posture: Secondary | ICD-10-CM | POA: Diagnosis not present

## 2017-07-17 DIAGNOSIS — R2681 Unsteadiness on feet: Secondary | ICD-10-CM | POA: Diagnosis not present

## 2017-07-17 DIAGNOSIS — R339 Retention of urine, unspecified: Secondary | ICD-10-CM | POA: Diagnosis not present

## 2017-07-17 DIAGNOSIS — G808 Other cerebral palsy: Secondary | ICD-10-CM | POA: Diagnosis not present

## 2017-07-18 DIAGNOSIS — R498 Other voice and resonance disorders: Secondary | ICD-10-CM | POA: Diagnosis not present

## 2017-07-18 DIAGNOSIS — R293 Abnormal posture: Secondary | ICD-10-CM | POA: Diagnosis not present

## 2017-07-18 DIAGNOSIS — G808 Other cerebral palsy: Secondary | ICD-10-CM | POA: Diagnosis not present

## 2017-07-18 DIAGNOSIS — M6281 Muscle weakness (generalized): Secondary | ICD-10-CM | POA: Diagnosis not present

## 2017-07-18 DIAGNOSIS — R2681 Unsteadiness on feet: Secondary | ICD-10-CM | POA: Diagnosis not present

## 2017-07-18 DIAGNOSIS — R278 Other lack of coordination: Secondary | ICD-10-CM | POA: Diagnosis not present

## 2017-07-21 DIAGNOSIS — R498 Other voice and resonance disorders: Secondary | ICD-10-CM | POA: Diagnosis not present

## 2017-07-21 DIAGNOSIS — M6281 Muscle weakness (generalized): Secondary | ICD-10-CM | POA: Diagnosis not present

## 2017-07-21 DIAGNOSIS — R2681 Unsteadiness on feet: Secondary | ICD-10-CM | POA: Diagnosis not present

## 2017-07-21 DIAGNOSIS — R293 Abnormal posture: Secondary | ICD-10-CM | POA: Diagnosis not present

## 2017-07-21 DIAGNOSIS — R278 Other lack of coordination: Secondary | ICD-10-CM | POA: Diagnosis not present

## 2017-07-21 DIAGNOSIS — G808 Other cerebral palsy: Secondary | ICD-10-CM | POA: Diagnosis not present

## 2017-07-22 DIAGNOSIS — R278 Other lack of coordination: Secondary | ICD-10-CM | POA: Diagnosis not present

## 2017-07-22 DIAGNOSIS — G808 Other cerebral palsy: Secondary | ICD-10-CM | POA: Diagnosis not present

## 2017-07-22 DIAGNOSIS — R2681 Unsteadiness on feet: Secondary | ICD-10-CM | POA: Diagnosis not present

## 2017-07-22 DIAGNOSIS — R498 Other voice and resonance disorders: Secondary | ICD-10-CM | POA: Diagnosis not present

## 2017-07-22 DIAGNOSIS — M6281 Muscle weakness (generalized): Secondary | ICD-10-CM | POA: Diagnosis not present

## 2017-07-22 DIAGNOSIS — R293 Abnormal posture: Secondary | ICD-10-CM | POA: Diagnosis not present

## 2017-07-23 DIAGNOSIS — R498 Other voice and resonance disorders: Secondary | ICD-10-CM | POA: Diagnosis not present

## 2017-07-23 DIAGNOSIS — M6281 Muscle weakness (generalized): Secondary | ICD-10-CM | POA: Diagnosis not present

## 2017-07-23 DIAGNOSIS — G808 Other cerebral palsy: Secondary | ICD-10-CM | POA: Diagnosis not present

## 2017-07-23 DIAGNOSIS — R2681 Unsteadiness on feet: Secondary | ICD-10-CM | POA: Diagnosis not present

## 2017-07-23 DIAGNOSIS — R293 Abnormal posture: Secondary | ICD-10-CM | POA: Diagnosis not present

## 2017-07-23 DIAGNOSIS — R278 Other lack of coordination: Secondary | ICD-10-CM | POA: Diagnosis not present

## 2017-07-24 DIAGNOSIS — M6281 Muscle weakness (generalized): Secondary | ICD-10-CM | POA: Diagnosis not present

## 2017-07-24 DIAGNOSIS — G808 Other cerebral palsy: Secondary | ICD-10-CM | POA: Diagnosis not present

## 2017-07-24 DIAGNOSIS — R2681 Unsteadiness on feet: Secondary | ICD-10-CM | POA: Diagnosis not present

## 2017-07-24 DIAGNOSIS — R278 Other lack of coordination: Secondary | ICD-10-CM | POA: Diagnosis not present

## 2017-07-24 DIAGNOSIS — R498 Other voice and resonance disorders: Secondary | ICD-10-CM | POA: Diagnosis not present

## 2017-07-24 DIAGNOSIS — R293 Abnormal posture: Secondary | ICD-10-CM | POA: Diagnosis not present

## 2017-07-25 ENCOUNTER — Other Ambulatory Visit: Payer: Self-pay | Admitting: Internal Medicine

## 2017-07-25 DIAGNOSIS — R293 Abnormal posture: Secondary | ICD-10-CM | POA: Diagnosis not present

## 2017-07-25 DIAGNOSIS — M6281 Muscle weakness (generalized): Secondary | ICD-10-CM | POA: Diagnosis not present

## 2017-07-25 DIAGNOSIS — Z1231 Encounter for screening mammogram for malignant neoplasm of breast: Secondary | ICD-10-CM

## 2017-07-25 DIAGNOSIS — R2681 Unsteadiness on feet: Secondary | ICD-10-CM | POA: Diagnosis not present

## 2017-07-25 DIAGNOSIS — R498 Other voice and resonance disorders: Secondary | ICD-10-CM | POA: Diagnosis not present

## 2017-07-25 DIAGNOSIS — R278 Other lack of coordination: Secondary | ICD-10-CM | POA: Diagnosis not present

## 2017-07-25 DIAGNOSIS — G808 Other cerebral palsy: Secondary | ICD-10-CM | POA: Diagnosis not present

## 2017-07-28 DIAGNOSIS — M6281 Muscle weakness (generalized): Secondary | ICD-10-CM | POA: Diagnosis not present

## 2017-07-28 DIAGNOSIS — G808 Other cerebral palsy: Secondary | ICD-10-CM | POA: Diagnosis not present

## 2017-07-28 DIAGNOSIS — R293 Abnormal posture: Secondary | ICD-10-CM | POA: Diagnosis not present

## 2017-07-28 DIAGNOSIS — R2681 Unsteadiness on feet: Secondary | ICD-10-CM | POA: Diagnosis not present

## 2017-07-28 DIAGNOSIS — R498 Other voice and resonance disorders: Secondary | ICD-10-CM | POA: Diagnosis not present

## 2017-07-28 DIAGNOSIS — R278 Other lack of coordination: Secondary | ICD-10-CM | POA: Diagnosis not present

## 2017-07-29 DIAGNOSIS — R498 Other voice and resonance disorders: Secondary | ICD-10-CM | POA: Diagnosis not present

## 2017-07-29 DIAGNOSIS — R2681 Unsteadiness on feet: Secondary | ICD-10-CM | POA: Diagnosis not present

## 2017-07-29 DIAGNOSIS — M6281 Muscle weakness (generalized): Secondary | ICD-10-CM | POA: Diagnosis not present

## 2017-07-29 DIAGNOSIS — R278 Other lack of coordination: Secondary | ICD-10-CM | POA: Diagnosis not present

## 2017-07-29 DIAGNOSIS — R293 Abnormal posture: Secondary | ICD-10-CM | POA: Diagnosis not present

## 2017-07-29 DIAGNOSIS — G808 Other cerebral palsy: Secondary | ICD-10-CM | POA: Diagnosis not present

## 2017-07-30 ENCOUNTER — Telehealth: Payer: Self-pay | Admitting: Gastroenterology

## 2017-07-30 DIAGNOSIS — R278 Other lack of coordination: Secondary | ICD-10-CM | POA: Diagnosis not present

## 2017-07-30 DIAGNOSIS — M6281 Muscle weakness (generalized): Secondary | ICD-10-CM | POA: Diagnosis not present

## 2017-07-30 DIAGNOSIS — R498 Other voice and resonance disorders: Secondary | ICD-10-CM | POA: Diagnosis not present

## 2017-07-30 DIAGNOSIS — R293 Abnormal posture: Secondary | ICD-10-CM | POA: Diagnosis not present

## 2017-07-30 DIAGNOSIS — G808 Other cerebral palsy: Secondary | ICD-10-CM | POA: Diagnosis not present

## 2017-07-30 DIAGNOSIS — R2681 Unsteadiness on feet: Secondary | ICD-10-CM | POA: Diagnosis not present

## 2017-07-30 NOTE — Telephone Encounter (Signed)
Left message on machine to call back  

## 2017-07-31 DIAGNOSIS — R278 Other lack of coordination: Secondary | ICD-10-CM | POA: Diagnosis not present

## 2017-07-31 DIAGNOSIS — R2681 Unsteadiness on feet: Secondary | ICD-10-CM | POA: Diagnosis not present

## 2017-07-31 DIAGNOSIS — R293 Abnormal posture: Secondary | ICD-10-CM | POA: Diagnosis not present

## 2017-07-31 DIAGNOSIS — M6281 Muscle weakness (generalized): Secondary | ICD-10-CM | POA: Diagnosis not present

## 2017-07-31 DIAGNOSIS — G808 Other cerebral palsy: Secondary | ICD-10-CM | POA: Diagnosis not present

## 2017-07-31 DIAGNOSIS — R498 Other voice and resonance disorders: Secondary | ICD-10-CM | POA: Diagnosis not present

## 2017-07-31 NOTE — Telephone Encounter (Signed)
Unable to reach the pt will wait for further communication from the pt 

## 2017-08-01 DIAGNOSIS — M6281 Muscle weakness (generalized): Secondary | ICD-10-CM | POA: Diagnosis not present

## 2017-08-01 DIAGNOSIS — R2681 Unsteadiness on feet: Secondary | ICD-10-CM | POA: Diagnosis not present

## 2017-08-01 DIAGNOSIS — R498 Other voice and resonance disorders: Secondary | ICD-10-CM | POA: Diagnosis not present

## 2017-08-01 DIAGNOSIS — R278 Other lack of coordination: Secondary | ICD-10-CM | POA: Diagnosis not present

## 2017-08-01 DIAGNOSIS — R293 Abnormal posture: Secondary | ICD-10-CM | POA: Diagnosis not present

## 2017-08-01 DIAGNOSIS — G808 Other cerebral palsy: Secondary | ICD-10-CM | POA: Diagnosis not present

## 2017-08-02 DIAGNOSIS — M6281 Muscle weakness (generalized): Secondary | ICD-10-CM | POA: Diagnosis not present

## 2017-08-02 DIAGNOSIS — R498 Other voice and resonance disorders: Secondary | ICD-10-CM | POA: Diagnosis not present

## 2017-08-02 DIAGNOSIS — R278 Other lack of coordination: Secondary | ICD-10-CM | POA: Diagnosis not present

## 2017-08-02 DIAGNOSIS — G808 Other cerebral palsy: Secondary | ICD-10-CM | POA: Diagnosis not present

## 2017-08-02 DIAGNOSIS — R2681 Unsteadiness on feet: Secondary | ICD-10-CM | POA: Diagnosis not present

## 2017-08-02 DIAGNOSIS — R293 Abnormal posture: Secondary | ICD-10-CM | POA: Diagnosis not present

## 2017-08-02 DIAGNOSIS — R482 Apraxia: Secondary | ICD-10-CM | POA: Diagnosis not present

## 2017-08-02 DIAGNOSIS — M24531 Contracture, right wrist: Secondary | ICD-10-CM | POA: Diagnosis not present

## 2017-08-04 DIAGNOSIS — G808 Other cerebral palsy: Secondary | ICD-10-CM | POA: Diagnosis not present

## 2017-08-04 DIAGNOSIS — R293 Abnormal posture: Secondary | ICD-10-CM | POA: Diagnosis not present

## 2017-08-04 DIAGNOSIS — R498 Other voice and resonance disorders: Secondary | ICD-10-CM | POA: Diagnosis not present

## 2017-08-04 DIAGNOSIS — M6281 Muscle weakness (generalized): Secondary | ICD-10-CM | POA: Diagnosis not present

## 2017-08-04 DIAGNOSIS — R2681 Unsteadiness on feet: Secondary | ICD-10-CM | POA: Diagnosis not present

## 2017-08-04 DIAGNOSIS — R278 Other lack of coordination: Secondary | ICD-10-CM | POA: Diagnosis not present

## 2017-08-05 ENCOUNTER — Ambulatory Visit
Admission: RE | Admit: 2017-08-05 | Discharge: 2017-08-05 | Disposition: A | Discharge status: Home / Self Care | Payer: Medicare Other | Source: Ambulatory Visit | Attending: Internal Medicine | Admitting: Internal Medicine

## 2017-08-05 DIAGNOSIS — R498 Other voice and resonance disorders: Secondary | ICD-10-CM | POA: Diagnosis not present

## 2017-08-05 DIAGNOSIS — M6281 Muscle weakness (generalized): Secondary | ICD-10-CM | POA: Diagnosis not present

## 2017-08-05 DIAGNOSIS — Z1231 Encounter for screening mammogram for malignant neoplasm of breast: Secondary | ICD-10-CM

## 2017-08-05 DIAGNOSIS — R2681 Unsteadiness on feet: Secondary | ICD-10-CM | POA: Diagnosis not present

## 2017-08-05 DIAGNOSIS — R278 Other lack of coordination: Secondary | ICD-10-CM | POA: Diagnosis not present

## 2017-08-05 DIAGNOSIS — R293 Abnormal posture: Secondary | ICD-10-CM | POA: Diagnosis not present

## 2017-08-05 DIAGNOSIS — G808 Other cerebral palsy: Secondary | ICD-10-CM | POA: Diagnosis not present

## 2017-08-06 DIAGNOSIS — R278 Other lack of coordination: Secondary | ICD-10-CM | POA: Diagnosis not present

## 2017-08-06 DIAGNOSIS — G808 Other cerebral palsy: Secondary | ICD-10-CM | POA: Diagnosis not present

## 2017-08-06 DIAGNOSIS — R293 Abnormal posture: Secondary | ICD-10-CM | POA: Diagnosis not present

## 2017-08-06 DIAGNOSIS — R2681 Unsteadiness on feet: Secondary | ICD-10-CM | POA: Diagnosis not present

## 2017-08-06 DIAGNOSIS — R498 Other voice and resonance disorders: Secondary | ICD-10-CM | POA: Diagnosis not present

## 2017-08-06 DIAGNOSIS — M6281 Muscle weakness (generalized): Secondary | ICD-10-CM | POA: Diagnosis not present

## 2017-08-07 ENCOUNTER — Other Ambulatory Visit: Payer: Self-pay | Admitting: Internal Medicine

## 2017-08-07 DIAGNOSIS — R293 Abnormal posture: Secondary | ICD-10-CM | POA: Diagnosis not present

## 2017-08-07 DIAGNOSIS — R278 Other lack of coordination: Secondary | ICD-10-CM | POA: Diagnosis not present

## 2017-08-07 DIAGNOSIS — M6281 Muscle weakness (generalized): Secondary | ICD-10-CM | POA: Diagnosis not present

## 2017-08-07 DIAGNOSIS — R928 Other abnormal and inconclusive findings on diagnostic imaging of breast: Secondary | ICD-10-CM

## 2017-08-07 DIAGNOSIS — R2681 Unsteadiness on feet: Secondary | ICD-10-CM | POA: Diagnosis not present

## 2017-08-07 DIAGNOSIS — R498 Other voice and resonance disorders: Secondary | ICD-10-CM | POA: Diagnosis not present

## 2017-08-07 DIAGNOSIS — G808 Other cerebral palsy: Secondary | ICD-10-CM | POA: Diagnosis not present

## 2017-08-08 DIAGNOSIS — R2681 Unsteadiness on feet: Secondary | ICD-10-CM | POA: Diagnosis not present

## 2017-08-08 DIAGNOSIS — M6281 Muscle weakness (generalized): Secondary | ICD-10-CM | POA: Diagnosis not present

## 2017-08-08 DIAGNOSIS — N9489 Other specified conditions associated with female genital organs and menstrual cycle: Secondary | ICD-10-CM | POA: Diagnosis not present

## 2017-08-08 DIAGNOSIS — R498 Other voice and resonance disorders: Secondary | ICD-10-CM | POA: Diagnosis not present

## 2017-08-08 DIAGNOSIS — G808 Other cerebral palsy: Secondary | ICD-10-CM | POA: Diagnosis not present

## 2017-08-08 DIAGNOSIS — R278 Other lack of coordination: Secondary | ICD-10-CM | POA: Diagnosis not present

## 2017-08-08 DIAGNOSIS — R293 Abnormal posture: Secondary | ICD-10-CM | POA: Diagnosis not present

## 2017-08-09 DIAGNOSIS — R498 Other voice and resonance disorders: Secondary | ICD-10-CM | POA: Diagnosis not present

## 2017-08-09 DIAGNOSIS — R293 Abnormal posture: Secondary | ICD-10-CM | POA: Diagnosis not present

## 2017-08-09 DIAGNOSIS — G808 Other cerebral palsy: Secondary | ICD-10-CM | POA: Diagnosis not present

## 2017-08-09 DIAGNOSIS — R2681 Unsteadiness on feet: Secondary | ICD-10-CM | POA: Diagnosis not present

## 2017-08-09 DIAGNOSIS — M6281 Muscle weakness (generalized): Secondary | ICD-10-CM | POA: Diagnosis not present

## 2017-08-09 DIAGNOSIS — R278 Other lack of coordination: Secondary | ICD-10-CM | POA: Diagnosis not present

## 2017-08-11 ENCOUNTER — Encounter (HOSPITAL_COMMUNITY): Payer: Self-pay

## 2017-08-11 ENCOUNTER — Emergency Department (HOSPITAL_COMMUNITY)
Admission: EM | Admit: 2017-08-11 | Discharge: 2017-08-11 | Disposition: A | Discharge status: Skilled Nursing Facility | Payer: Medicare Other | Attending: Emergency Medicine | Admitting: Emergency Medicine

## 2017-08-11 DIAGNOSIS — T83098A Other mechanical complication of other indwelling urethral catheter, initial encounter: Secondary | ICD-10-CM | POA: Diagnosis not present

## 2017-08-11 DIAGNOSIS — I10 Essential (primary) hypertension: Secondary | ICD-10-CM | POA: Insufficient documentation

## 2017-08-11 DIAGNOSIS — M6281 Muscle weakness (generalized): Secondary | ICD-10-CM | POA: Diagnosis not present

## 2017-08-11 DIAGNOSIS — R102 Pelvic and perineal pain: Secondary | ICD-10-CM | POA: Diagnosis not present

## 2017-08-11 DIAGNOSIS — G808 Other cerebral palsy: Secondary | ICD-10-CM | POA: Diagnosis not present

## 2017-08-11 DIAGNOSIS — G800 Spastic quadriplegic cerebral palsy: Secondary | ICD-10-CM | POA: Insufficient documentation

## 2017-08-11 DIAGNOSIS — Z7901 Long term (current) use of anticoagulants: Secondary | ICD-10-CM | POA: Diagnosis not present

## 2017-08-11 DIAGNOSIS — R293 Abnormal posture: Secondary | ICD-10-CM | POA: Diagnosis not present

## 2017-08-11 DIAGNOSIS — G803 Athetoid cerebral palsy: Secondary | ICD-10-CM | POA: Insufficient documentation

## 2017-08-11 DIAGNOSIS — R278 Other lack of coordination: Secondary | ICD-10-CM | POA: Diagnosis not present

## 2017-08-11 DIAGNOSIS — Y733 Surgical instruments, materials and gastroenterology and urology devices (including sutures) associated with adverse incidents: Secondary | ICD-10-CM | POA: Diagnosis not present

## 2017-08-11 DIAGNOSIS — Z79899 Other long term (current) drug therapy: Secondary | ICD-10-CM | POA: Insufficient documentation

## 2017-08-11 DIAGNOSIS — R100 Acute abdomen: Secondary | ICD-10-CM | POA: Diagnosis not present

## 2017-08-11 DIAGNOSIS — T83091A Other mechanical complication of indwelling urethral catheter, initial encounter: Secondary | ICD-10-CM | POA: Diagnosis not present

## 2017-08-11 DIAGNOSIS — G2581 Restless legs syndrome: Secondary | ICD-10-CM | POA: Diagnosis not present

## 2017-08-11 DIAGNOSIS — R498 Other voice and resonance disorders: Secondary | ICD-10-CM | POA: Diagnosis not present

## 2017-08-11 DIAGNOSIS — R2681 Unsteadiness on feet: Secondary | ICD-10-CM | POA: Diagnosis not present

## 2017-08-11 DIAGNOSIS — R3989 Other symptoms and signs involving the genitourinary system: Secondary | ICD-10-CM | POA: Diagnosis present

## 2017-08-11 DIAGNOSIS — N9489 Other specified conditions associated with female genital organs and menstrual cycle: Secondary | ICD-10-CM | POA: Diagnosis not present

## 2017-08-11 DIAGNOSIS — N318 Other neuromuscular dysfunction of bladder: Secondary | ICD-10-CM | POA: Diagnosis not present

## 2017-08-11 LAB — URINALYSIS, ROUTINE W REFLEX MICROSCOPIC
Bilirubin Urine: NEGATIVE
GLUCOSE, UA: NEGATIVE mg/dL
Ketones, ur: NEGATIVE mg/dL
Nitrite: POSITIVE — AB
PROTEIN: 30 mg/dL — AB
Specific Gravity, Urine: 1.006 (ref 1.005–1.030)
pH: 7 (ref 5.0–8.0)

## 2017-08-11 MED ORDER — HYDROCODONE-ACETAMINOPHEN 5-325 MG PO TABS
1.0000 | ORAL_TABLET | Freq: Once | ORAL | Status: AC
  Administered 2017-08-11: 1 via ORAL
  Filled 2017-08-11: qty 1

## 2017-08-11 NOTE — ED Notes (Signed)
Bed: [id number] Expected date:  Expected time:  Means of arrival:  Comments: EMS 41 yo female CP-no urine output from catheter

## 2017-08-11 NOTE — ED Notes (Signed)
Attempted to irrigate supra pubic catheter-unable to aspirate any urine nor able to irrigate catheter

## 2017-08-11 NOTE — ED Notes (Signed)
Patient was alert, oriented and stable upon discharge. RN went over AVS and patient had no further questions.  

## 2017-08-11 NOTE — ED Notes (Signed)
No respiratory or acute distress noted alert and talking call light in reach no reaction to medication noted.

## 2017-08-11 NOTE — ED Notes (Signed)
Urine is bloody and cloudy with small blood clots noted

## 2017-08-11 NOTE — ED Notes (Signed)
No respiratory or acute distress noted alert and oriented call light in reach. 

## 2017-08-11 NOTE — Discharge Instructions (Signed)
Monitor urine output closely. Follow up with your doctor with any further concerns. Return to the emergency department as needed.

## 2017-08-11 NOTE — ED Provider Notes (Signed)
[facility]-EMERGENCY DEPT Provider Note   CSN: [id number] Arrival date & time: 08/11/17  0415     History   Chief Complaint Chief Complaint  Patient presents with  . Urinary catheter not draining    HPI [name] [name] is a 41 y.o. female.  Patient with a history of cerebral palsy, resident at [facility] nursing home arrives via EMS for concern of obstructed, suprapubic urinary catheter. Per nursing home staff report to EMS the catheter stopped draining at around 7:00 pm last night. The patient complains of bladder pain. No fever, nausea or vomiting.    The history is provided by the patient and the nursing home. No language interpreter was used.    Past Medical History:  Diagnosis Date  . Abdominal pain 01/02/2012   Overview:  Overview:  Per Previous pcp note- Dr. Ta--Pt has history of endometriosis and had DUB in 2011.  She She had u/s that showed normal endometrial stripe.  She had endometrial bx that was wnl.  She has a lot of abd cramping every month.  This cramping was sometimes not relieved with hydrocodone.  She was referred to [facility] for IUD placement to help endometriosis and abd cramping.  [name]  . Abdominal pain 01/02/2012   Overview:  Overview:  Per Previous pcp note- Dr. Ta--Pt has history of endometriosis and had DUB in 2011.  She She had u/s that showed normal endometrial stripe.  She had endometrial bx that was wnl.  She has a lot of abd cramping every month.  This cramping was sometimes not relieved with hydrocodone.  She was referred to [facility] for IUD placement to help endometriosis and abd cramping.  [name] was placed 04/30/11 by [facility].  Pt still complains of abd cramping, which I am treating with hydrocodone and ultram. Pt reports some initial relief of pain from IUD, but states it has now returned back to similar to previous cramping. Gave patient a shot of Toradol 60 mg IM today-since positive exacerbation of abdominal cramping during past 2 weeks. Physical exam  reassuring. Patient may need to return to [facility] for further workup since she is plugged in with the GYN providers there for further treatment options regarding endometriosis and abdominal cramping. The dysfunctional uterine bleeding now well contr  . Acute cystitis with hematuria   . Acute cystitis without hematuria   . Acute respiratory failure with hypoxia ([name])   . Anemia   . Cellulitis 12/22/2014  . Cerebral palsy ([name])    Spastic Cerebral palsy, mentally intact  . Contracture, joint, multiple sites    Electric wheelchair, uses left hand to operate chair.   . Depression   . Disease of female genital organs 10/24/2010   Overview:  Overview:  Pt has history of endometriosis and had DUB in 2011.  She had u/s that showed normal endometrial stripe.  She had endometrial bx that was wnl.  She has a lot of abd cramping every month.  This cramping was sometimes not relieved with hydrocodone.  She was referred to [facility] for IUD placement to help endometriosis and abd cramping.  [name] was placed 04/30/11 by [facility].  Pt still complains of abd cramping, which was treated with hydrocodone and ultram.   Last seen at [facility] by OB/GYN 12/24/11, given Depo Lupron injection.  Ordering CT scan to r/o additional cause of abdominal pain (pt with known endometriosis and will not tolerate vaginal U/S, pain not improved s/p [name] or with Lupron injection-- only caused hot flashes and  amenorrhea, no pain relief).  Also made referral to pain clinic for better pain control.  Do not think hysterectomy would help with pain, and concern for contamination of baclofen pump if did proceed with surgery.    8/5/13[name]: Pt was seen by   . [name]   . Dysarthria   . Dyspnea and respiratory abnormality 02/04/2011   Overview:  Overview:  Pt has history of recurrent PE and is on chronic coumadin.  Pt has been complaining of dyspnea that she describes as chest tightness.  She has been to the  ER multiple times for this.  Usually she gets CTA and serial CE.  She has had at least 6 CTA in a period of since 2011.  She also has an O2 requirement. It is unclear why pt feels dyspneic, complains of chest tightness, or has O2 requirement.  I thought that her complaint may be from deconditioning and referred her to PT.  Unfortunately Medicaid only pays for 4 PT visits.  Pt should be doing these exercises at home, but she has not.  I have referred her to [facility], and she has been seen by [name].  He did not understand the etiology of her dyspnea and O2 requirement.  He will continue to follow her.   Last Assessment & Plan:  Pt presents with concerns for chest tightness and dyspnea.  She was seen in ED yesterday and discharged home.  While there she had a CT chest 04/20/11 showed 1.  Multifocal degradation as detailed above.  N  . Endometriosis   . GERD (gastroesophageal reflux disease)   . Gram positive sepsis ([name]) 10/24/2014  . Gross hematuria 03/12/2013   Overview:  Overview:  status post replacement of suprapubic tube 03/04/2013  Last Assessment & Plan:  Recent urine culture collected in ED on 5/4 did not result in significant growth. Moreover, patient has been asymptomatic with normal WBC, no fever and no true symptoms making symptomatic UTI less likely.  Will continue to monitor for any sign of infection.  Patient's xarelto had been held during her recent hospitalization due to hematuria. This has since been re-started. Monitor bleeding.    [name] Kitchen HCAP (healthcare-associated pneumonia) 10/25/2014  . History of anticoagulant therapy 10/21/2012  . History of endometriosis 10/2012   OBGYN [facility]: Laparoscopic Endometrial Ablation, TVH,  . History of recurrent UTIs   . Hypertension   . HYPERTENSION, BENIGN 01/31/2010   Qualifier: Diagnosis of  By: [name], MD, [name] Incontinent of feces 03/11/2016  . Infantile cerebral palsy ([name]) 01/29/2007   Overview:  She has spastic CP.  She is mentally  intact.  She is wheelchair bound.  She is wheelchair bound for ambulation      . Intracervical pessary 05/07/2011   Overview:  Overview:  Placed by [facility] 04/30/11 to treat DUB and Endometriosis.  She is seen at GYN clinic at [facility] but was considered a poor surgical candidate and referred her to [facility].  For DUB she had pelvic ultrasound that showed thin stripe and she had endometrial Bx by Dr Jennette Kettle in GYN clinic, which was negative.     . Macroscopic hematuria 03/10/2013   status post replacement of suprapubic tube 03/04/2013   . Migraine   . OCD (obsessive compulsive disorder)   . Parapneumonic effusion 10/25/2014  . Presence of intrathecal baclofen pump 03/07/2014   R LQ. Insertion T10.    [name] Kitchen Psychiatric illness 10/31/2014  .  Pulmonary embolism ([name])    Lifetime Coumadin  . Pulmonary embolism ([name]) 2011, 01/2011   Will be on lifetime coumadin  . Recurrent pulmonary embolism ([name]) 05/03/2011   Pt has history of recurrent PE and is on chronic coumadin.  Pt has been complaining of dyspnea that she describes as chest tightness.  She has been to the ER multiple times for this.  Usually she gets CTA and serial CE.  She has had at least 6 CTA in a period of since 2011.  She also has an O2 requirement. It is unclear why pt feels dyspneic, complains of chest tightness, or has O2 requirement.  I thought that her complaint may be from deconditioning and referred her to PT.  Unfortunately Medicaid only pays for 4 PT visits.  Pt should be doing these exercises at home, but she has not.  I have referred her to [facility], and she has been seen by [name].  He did not understand the etiology of her dyspnea and O2 requirement.  He will continue to follow her.     . Seborrheic keratosis 01/18/2011  . Seizures ([name]) 02/12/2016  . Spasticity 01/05/2014  . Transient alteration of awareness, recurrent 09/08/2006   Recurrent episodes where Kadijah  loses contact with her world and that have been studied thoroughly and  appear nonepileptic in nature per Dr Terrace Arabia (neurology) The patient has had episodes starting in 2007 that initially began 1-4 times a day during which time she would have periods of unawareness followed by confusion. This continuous video EEG monitoring performed from October 8-12, 2007. Had   . Urethra dilated and patulous 07/03/2015   Patulous, Dilated Urethra. 5mL balloon on a 22-Fr catheter hoping this would prevent the bladder spasm from pushing the balloon down the urethra (Dr Logan Bores, Urology [facility], July 2016)     Patient Active Problem List   Diagnosis Date Noted  . Incontinent of feces 03/11/2016  . Chronic anticoagulation 03/11/2016  . Involuntary movements 02/22/2016  . Seizures ([name]) 02/12/2016  . Dyspnea 02/12/2016  . Cerebral palsy, quadriplegic ([name]) 02/06/2016  . Swelling of extremity 01/13/2016  . Pressure ulcer 10/09/2015  . Status post insertion of inferior vena caval filter 09/28/2015  . Absence of bladder continence 08/25/2015  . Person living in residential institution 06/14/2015  . Pulmonary hypertension ([name])   . Anemia of chronic disease   . Spasticity 03/15/2015  . Constipation, chronic 10/10/2014  . Athetoid cerebral palsy ([name]) 04/13/2014  . Dystonia 04/12/2014  . Presence of intrathecal baclofen pump 03/07/2014  . Dental caries 07/18/2013  . Multiple thyroid nodules 05/20/2013  . Osteoarthrosis 04/07/2013  . [name]   . Dysarthria   . GERD (gastroesophageal reflux disease)   . Neurogenic bladder 02/05/2013  . Anxiety state 10/21/2012  . Chronic pain syndrome 06/17/2012  . Recurrent pulmonary embolism ([name]) 05/03/2011  . Pulmonary embolism with infarction ([name]) 05/03/2011  . Chronic suprapubic catheter ([name]) 03/15/2011  . Disease of female genital organs 10/24/2010  . HYPERTENSION, BENIGN 01/31/2010  . CP (cerebral palsy), spastic ([name]) 12/27/2008  . Contracted, joint, multiple sites 12/27/2008  . Major depressive disorder,  recurrent episode ([name]) 01/29/2007  . Obsessive Compulsive Disorder 01/29/2007  . Transient alteration of awareness, recurrent 09/08/2006    Past Surgical History:  Procedure Laterality Date  . ABDOMINAL HYSTERECTOMY    . APPENDECTOMY    . BACLOFEN PUMP REFILL     x 3 times  . CARPAL TUNNEL RELEASE  08/2008  Dr Teressa SenterSypher  . CESAREAN SECTION     x 2  . CHOLECYSTECTOMY  10/14/2015   Procedure: LAPAROSCOPIC CHOLECYSTECTOMY;  Surgeon: Violeta GelinasBurke Thompson, MD;  [facility];  Service: General;;  . COLPOSCOPY  06/2000  . INTRAUTERINE DEVICE INSERTION  04/30/11   [facility]  . laparoscopically assisted Vag Hysterectomy  10/27/2012  . MULTIPLE EXTRACTIONS WITH ALVEOLOPLASTY Bilateral 07/19/2013   Procedure: EXTRACTIONS #4, 1,61,09,605,11,16,19;  Surgeon: Francene Findershristopher L Murray, DDS;  [facility];  Service: Oral Surgery;  Laterality: Bilateral;  . NEUROMA SURGERY Left    anterior and posterior intersosseous  . PAIN PUMP IMPLANTATION N/A 03/07/2014   Procedure: baclofen pump revision/replacement and Catheter connection replacement;  Surgeon: Cristi LoronJeffrey D Jenkins, MD;  Location: [facility] NEURO ORS;  Service: Neurosurgery;  Laterality: N/A;  baclofen pump revision/replacement and Catheter connection replacement  . PROGRAMABLE BACLOFEN PUMP REVISION  03/17/14   Battery Replacement   . TUBAL LIGATION  2003  . URETHRA SURGERY    . WRIST SURGERY  06/2010   Dr Dierdre SearlesLi[facility]    OB History    No data available       Home Medications    Prior to Admission medications   Medication Sig Start Date End Date Taking? Authorizing Provider  acetaminophen (TYLENOL) 325 MG tablet Take 650 mg by mouth daily as needed for moderate pain.     [provider]  amantadine (SYMMETREL) 100 MG capsule Take 100 mg by mouth daily.     [provider]  antiseptic oral rinse (BIOTENE) LIQD 15 mLs by Mouth Rinse route as needed for dry mouth.    [provider]    baclofen (LIORESAL) 20 MG tablet Take 20 mg by mouth 4 (four) times daily. For bladder spasms    [provider]  bisacodyl (DULCOLAX) 10 MG suppository Place 10 mg rectally as needed for moderate constipation.    [provider]  cephALEXin (KEFLEX) 500 MG capsule Take 1 capsule (500 mg total) by mouth 2 (two) times daily. 06/13/17   Khatri, Hina, PA-C  clonazePAM (KLONOPIN) 0.5 MG tablet Take 0.5 mg by mouth 3 (three) times daily.     [provider]  FLUoxetine HCl 60 MG TABS Take 60 mg by mouth daily.     [provider]  lactose free nutrition (BOOST) LIQD Take 237 mLs by mouth 2 (two) times daily between meals.    [provider]  lamoTRIgine (LAMICTAL) 100 MG tablet Take 100 mg by mouth at bedtime.    [provider]  lubiprostone (AMITIZA) 24 MCG capsule Take 1 capsule (24 mcg total) by mouth 2 (two) times daily with a meal. 01/01/17   Esterwood, [name] S, PA-C  Melatonin 5 MG TABS Take 5 mg by mouth at bedtime.     [provider]  Multiple Vitamins-Minerals (CERTA-VITE PO) Take 1 tablet by mouth daily.    [provider]  oxybutynin (DITROPAN) 5 MG tablet Take 5 mg by mouth 4 (four) times daily.  02/20/16   [provider]  oxyCODONE (ROXICODONE) 5 MG immediate release tablet Take one tablet by mouth every 4 hours as needed for pain 07/10/16   Kimber RelicGreen, Arthur G, MD  polyethylene glycol powder (GLYCOLAX/MIRALAX) powder Take 1  1/2 dose  daily in 12 ounces of fluid every day. Patient taking differently: Take 17 g by mouth daily. Mix with 4-8 oz of liquid daily 01/01/17   Esterwood, [name] S, PA-C  QUEtiapine (SEROQUEL) 100 MG tablet Take 1 tablet (100 mg total) by mouth at bedtime. 06/21/15   Tyrone Nine, MD  tiZANidine (ZANAFLEX) 4 MG capsule Take 4 mg by mouth 2 (two) times daily.     [provider]  tretinoin (RETIN-A) 0.05 % cream Apply 1 application topically at bedtime. face    [provider]   warfarin (COUMADIN) 6 MG tablet Take 6 mg by mouth daily at 6 PM.    [provider]    Family History Family History  Problem Relation Age of Onset  . Asthma Father   . Colon cancer Maternal Grandmother        Died in her 28's  . Breast cancer Paternal Grandmother        Died in her 66's  . Heart attack Maternal Grandfather        Died in his 62's  . Alzheimer's disease Paternal Grandfather        Died in his 74's  . Breast cancer Mother   . Stomach cancer Neg Hx     Social History Social History  Substance Use Topics  . Smoking status: Never Smoker  . Smokeless tobacco: Never Used  . Alcohol use 0.6 oz/week    1 Glasses of wine per week     Comment: Drinks alcohol once a month.     Allergies   Morphine   Review of Systems Review of Systems  Constitutional: Negative for chills and fever.  Respiratory: Negative.   Cardiovascular: Negative.   Gastrointestinal: Positive for abdominal pain. Negative for abdominal distention and vomiting.  Genitourinary:       See HPI.  Musculoskeletal: Negative.   Skin: Negative.   Neurological: Negative.      Physical Exam Updated Vital Signs BP 115/83 (BP Location: Left Arm)   Pulse 79   Temp 98.7 F (37.1 C) (Oral)   Resp 18   LMP 04/05/2011   SpO2 96%   Physical Exam  Constitutional: She is oriented to person, place, and time. She appears well-developed and well-nourished.  Neck: Normal range of motion.  Pulmonary/Chest: Effort normal.  Abdominal:  Device palpable in right lower abdomen reported as Baclofen pump by patient. Suprapubic catheter in place. No urinary drainage. Site unremarkable - no bleeding, redness, malodor or drainage.   Neurological: She is alert and oriented to person, place, and time.  Skin: Skin is warm and dry.     ED Treatments / Results  Labs (all labs ordered are listed, but only abnormal results are displayed) Labs Reviewed  URINALYSIS, ROUTINE W REFLEX MICROSCOPIC -  Abnormal; Notable for the following:       Result Value   APPearance CLOUDY (*)    Hgb urine dipstick LARGE (*)    Protein, ur 30 (*)    Nitrite POSITIVE (*)    Leukocytes, UA LARGE (*)    Bacteria, UA MANY (*)    Squamous Epithelial / LPF 0-5 (*)    All other components within normal limits    EKG  EKG Interpretation None       Radiology No results found.  Procedures Procedures (including critical care time) Existing urinary catheter removed after balloon deflation. Blood tinged urine spill. No malodor. New foley catheter inserted without resistance or difficulty, balloon inflated with snug fit. Patient tolerated procedure well.  Medications Ordered in ED Medications  HYDROcodone-acetaminophen (NORCO/VICODIN) 5-325 MG per tablet 1 tablet (1 tablet Oral Given 08/11/17 0511)     Initial Impression /  Assessment and Plan / ED Course  I have reviewed the triage vital signs and the nursing notes.  Pertinent labs & imaging results that were available during my care of the patient were reviewed by me and considered in my medical decision making (see chart for details).       Patient with indwellling suprapubic catheter that is obstructed. RN attempted to irrigate without success.   New foley placed as per above note and is draining without obstruction or difficulty.  Final Clinical Impressions(s) / ED Diagnoses   Final diagnoses:  None   1. Urinary catheter obstruction  New Prescriptions New Prescriptions   No medications on file     Elpidio Anis, Cordelia Poche 08/11/17 1610    Shon Baton, MD 08/12/17 2251

## 2017-08-11 NOTE — ED Notes (Signed)
Dressing around supra pubic cath soiled with yellowish green drainage-removed-will reapply after catheter changed

## 2017-08-11 NOTE — ED Notes (Signed)
[facility] called for transport.  

## 2017-08-11 NOTE — ED Triage Notes (Signed)
Patient arrives by EMS from [facility] with foley catheter not draining since 1900 last night. Patient has cerebral palsy. Patient reports to EMS 9/10 pain in her bladder area. BP 128/80 HR 80 RR16

## 2017-08-12 DIAGNOSIS — R278 Other lack of coordination: Secondary | ICD-10-CM | POA: Diagnosis not present

## 2017-08-12 DIAGNOSIS — R293 Abnormal posture: Secondary | ICD-10-CM | POA: Diagnosis not present

## 2017-08-12 DIAGNOSIS — G808 Other cerebral palsy: Secondary | ICD-10-CM | POA: Diagnosis not present

## 2017-08-12 DIAGNOSIS — R2681 Unsteadiness on feet: Secondary | ICD-10-CM | POA: Diagnosis not present

## 2017-08-12 DIAGNOSIS — M6281 Muscle weakness (generalized): Secondary | ICD-10-CM | POA: Diagnosis not present

## 2017-08-12 DIAGNOSIS — R498 Other voice and resonance disorders: Secondary | ICD-10-CM | POA: Diagnosis not present

## 2017-08-13 DIAGNOSIS — R278 Other lack of coordination: Secondary | ICD-10-CM | POA: Diagnosis not present

## 2017-08-13 DIAGNOSIS — R293 Abnormal posture: Secondary | ICD-10-CM | POA: Diagnosis not present

## 2017-08-13 DIAGNOSIS — I10 Essential (primary) hypertension: Secondary | ICD-10-CM | POA: Diagnosis not present

## 2017-08-13 DIAGNOSIS — M419 Scoliosis, unspecified: Secondary | ICD-10-CM | POA: Diagnosis not present

## 2017-08-13 DIAGNOSIS — G808 Other cerebral palsy: Secondary | ICD-10-CM | POA: Diagnosis not present

## 2017-08-13 DIAGNOSIS — R2681 Unsteadiness on feet: Secondary | ICD-10-CM | POA: Diagnosis not present

## 2017-08-13 DIAGNOSIS — R498 Other voice and resonance disorders: Secondary | ICD-10-CM | POA: Diagnosis not present

## 2017-08-13 DIAGNOSIS — G248 Other dystonia: Secondary | ICD-10-CM | POA: Diagnosis not present

## 2017-08-13 DIAGNOSIS — M6281 Muscle weakness (generalized): Secondary | ICD-10-CM | POA: Diagnosis not present

## 2017-08-14 DIAGNOSIS — M6281 Muscle weakness (generalized): Secondary | ICD-10-CM | POA: Diagnosis not present

## 2017-08-14 DIAGNOSIS — R278 Other lack of coordination: Secondary | ICD-10-CM | POA: Diagnosis not present

## 2017-08-14 DIAGNOSIS — R498 Other voice and resonance disorders: Secondary | ICD-10-CM | POA: Diagnosis not present

## 2017-08-14 DIAGNOSIS — G808 Other cerebral palsy: Secondary | ICD-10-CM | POA: Diagnosis not present

## 2017-08-14 DIAGNOSIS — R2681 Unsteadiness on feet: Secondary | ICD-10-CM | POA: Diagnosis not present

## 2017-08-14 DIAGNOSIS — R293 Abnormal posture: Secondary | ICD-10-CM | POA: Diagnosis not present

## 2017-08-15 DIAGNOSIS — R2681 Unsteadiness on feet: Secondary | ICD-10-CM | POA: Diagnosis not present

## 2017-08-15 DIAGNOSIS — I2699 Other pulmonary embolism without acute cor pulmonale: Secondary | ICD-10-CM | POA: Diagnosis not present

## 2017-08-15 DIAGNOSIS — R278 Other lack of coordination: Secondary | ICD-10-CM | POA: Diagnosis not present

## 2017-08-15 DIAGNOSIS — R498 Other voice and resonance disorders: Secondary | ICD-10-CM | POA: Diagnosis not present

## 2017-08-15 DIAGNOSIS — K5909 Other constipation: Secondary | ICD-10-CM | POA: Diagnosis not present

## 2017-08-15 DIAGNOSIS — M542 Cervicalgia: Secondary | ICD-10-CM | POA: Diagnosis not present

## 2017-08-15 DIAGNOSIS — K219 Gastro-esophageal reflux disease without esophagitis: Secondary | ICD-10-CM | POA: Diagnosis not present

## 2017-08-15 DIAGNOSIS — M6281 Muscle weakness (generalized): Secondary | ICD-10-CM | POA: Diagnosis not present

## 2017-08-15 DIAGNOSIS — R293 Abnormal posture: Secondary | ICD-10-CM | POA: Diagnosis not present

## 2017-08-15 DIAGNOSIS — G808 Other cerebral palsy: Secondary | ICD-10-CM | POA: Diagnosis not present

## 2017-08-18 ENCOUNTER — Ambulatory Visit
Admission: RE | Admit: 2017-08-18 | Discharge: 2017-08-18 | Disposition: A | Discharge status: Home / Self Care | Payer: Medicare Other | Source: Ambulatory Visit | Attending: Internal Medicine | Admitting: Internal Medicine

## 2017-08-18 DIAGNOSIS — R293 Abnormal posture: Secondary | ICD-10-CM | POA: Diagnosis not present

## 2017-08-18 DIAGNOSIS — R2681 Unsteadiness on feet: Secondary | ICD-10-CM | POA: Diagnosis not present

## 2017-08-18 DIAGNOSIS — G808 Other cerebral palsy: Secondary | ICD-10-CM | POA: Diagnosis not present

## 2017-08-18 DIAGNOSIS — R928 Other abnormal and inconclusive findings on diagnostic imaging of breast: Secondary | ICD-10-CM

## 2017-08-18 DIAGNOSIS — R921 Mammographic calcification found on diagnostic imaging of breast: Secondary | ICD-10-CM | POA: Diagnosis not present

## 2017-08-18 DIAGNOSIS — M6281 Muscle weakness (generalized): Secondary | ICD-10-CM | POA: Diagnosis not present

## 2017-08-18 DIAGNOSIS — R278 Other lack of coordination: Secondary | ICD-10-CM | POA: Diagnosis not present

## 2017-08-18 DIAGNOSIS — R498 Other voice and resonance disorders: Secondary | ICD-10-CM | POA: Diagnosis not present

## 2017-08-19 DIAGNOSIS — R278 Other lack of coordination: Secondary | ICD-10-CM | POA: Diagnosis not present

## 2017-08-19 DIAGNOSIS — R2681 Unsteadiness on feet: Secondary | ICD-10-CM | POA: Diagnosis not present

## 2017-08-19 DIAGNOSIS — M6281 Muscle weakness (generalized): Secondary | ICD-10-CM | POA: Diagnosis not present

## 2017-08-19 DIAGNOSIS — R498 Other voice and resonance disorders: Secondary | ICD-10-CM | POA: Diagnosis not present

## 2017-08-19 DIAGNOSIS — R293 Abnormal posture: Secondary | ICD-10-CM | POA: Diagnosis not present

## 2017-08-19 DIAGNOSIS — G808 Other cerebral palsy: Secondary | ICD-10-CM | POA: Diagnosis not present

## 2017-08-20 DIAGNOSIS — M6281 Muscle weakness (generalized): Secondary | ICD-10-CM | POA: Diagnosis not present

## 2017-08-20 DIAGNOSIS — G808 Other cerebral palsy: Secondary | ICD-10-CM | POA: Diagnosis not present

## 2017-08-20 DIAGNOSIS — R278 Other lack of coordination: Secondary | ICD-10-CM | POA: Diagnosis not present

## 2017-08-20 DIAGNOSIS — R293 Abnormal posture: Secondary | ICD-10-CM | POA: Diagnosis not present

## 2017-08-20 DIAGNOSIS — R498 Other voice and resonance disorders: Secondary | ICD-10-CM | POA: Diagnosis not present

## 2017-08-20 DIAGNOSIS — R2681 Unsteadiness on feet: Secondary | ICD-10-CM | POA: Diagnosis not present

## 2017-08-21 DIAGNOSIS — R2681 Unsteadiness on feet: Secondary | ICD-10-CM | POA: Diagnosis not present

## 2017-08-21 DIAGNOSIS — M6281 Muscle weakness (generalized): Secondary | ICD-10-CM | POA: Diagnosis not present

## 2017-08-21 DIAGNOSIS — R498 Other voice and resonance disorders: Secondary | ICD-10-CM | POA: Diagnosis not present

## 2017-08-21 DIAGNOSIS — G808 Other cerebral palsy: Secondary | ICD-10-CM | POA: Diagnosis not present

## 2017-08-21 DIAGNOSIS — R293 Abnormal posture: Secondary | ICD-10-CM | POA: Diagnosis not present

## 2017-08-21 DIAGNOSIS — R278 Other lack of coordination: Secondary | ICD-10-CM | POA: Diagnosis not present

## 2017-08-22 DIAGNOSIS — G808 Other cerebral palsy: Secondary | ICD-10-CM | POA: Diagnosis not present

## 2017-08-22 DIAGNOSIS — R498 Other voice and resonance disorders: Secondary | ICD-10-CM | POA: Diagnosis not present

## 2017-08-22 DIAGNOSIS — R2681 Unsteadiness on feet: Secondary | ICD-10-CM | POA: Diagnosis not present

## 2017-08-22 DIAGNOSIS — R278 Other lack of coordination: Secondary | ICD-10-CM | POA: Diagnosis not present

## 2017-08-22 DIAGNOSIS — R293 Abnormal posture: Secondary | ICD-10-CM | POA: Diagnosis not present

## 2017-08-22 DIAGNOSIS — M6281 Muscle weakness (generalized): Secondary | ICD-10-CM | POA: Diagnosis not present

## 2017-08-25 DIAGNOSIS — R293 Abnormal posture: Secondary | ICD-10-CM | POA: Diagnosis not present

## 2017-08-25 DIAGNOSIS — R498 Other voice and resonance disorders: Secondary | ICD-10-CM | POA: Diagnosis not present

## 2017-08-25 DIAGNOSIS — R278 Other lack of coordination: Secondary | ICD-10-CM | POA: Diagnosis not present

## 2017-08-25 DIAGNOSIS — M6281 Muscle weakness (generalized): Secondary | ICD-10-CM | POA: Diagnosis not present

## 2017-08-25 DIAGNOSIS — R2681 Unsteadiness on feet: Secondary | ICD-10-CM | POA: Diagnosis not present

## 2017-08-25 DIAGNOSIS — G808 Other cerebral palsy: Secondary | ICD-10-CM | POA: Diagnosis not present

## 2017-08-26 DIAGNOSIS — R293 Abnormal posture: Secondary | ICD-10-CM | POA: Diagnosis not present

## 2017-08-26 DIAGNOSIS — M6281 Muscle weakness (generalized): Secondary | ICD-10-CM | POA: Diagnosis not present

## 2017-08-26 DIAGNOSIS — G808 Other cerebral palsy: Secondary | ICD-10-CM | POA: Diagnosis not present

## 2017-08-26 DIAGNOSIS — R2681 Unsteadiness on feet: Secondary | ICD-10-CM | POA: Diagnosis not present

## 2017-08-26 DIAGNOSIS — R498 Other voice and resonance disorders: Secondary | ICD-10-CM | POA: Diagnosis not present

## 2017-08-26 DIAGNOSIS — R278 Other lack of coordination: Secondary | ICD-10-CM | POA: Diagnosis not present

## 2017-08-27 DIAGNOSIS — R293 Abnormal posture: Secondary | ICD-10-CM | POA: Diagnosis not present

## 2017-08-27 DIAGNOSIS — R498 Other voice and resonance disorders: Secondary | ICD-10-CM | POA: Diagnosis not present

## 2017-08-27 DIAGNOSIS — R2681 Unsteadiness on feet: Secondary | ICD-10-CM | POA: Diagnosis not present

## 2017-08-27 DIAGNOSIS — R278 Other lack of coordination: Secondary | ICD-10-CM | POA: Diagnosis not present

## 2017-08-27 DIAGNOSIS — M6281 Muscle weakness (generalized): Secondary | ICD-10-CM | POA: Diagnosis not present

## 2017-08-27 DIAGNOSIS — G808 Other cerebral palsy: Secondary | ICD-10-CM | POA: Diagnosis not present

## 2017-08-28 DIAGNOSIS — M6281 Muscle weakness (generalized): Secondary | ICD-10-CM | POA: Diagnosis not present

## 2017-08-28 DIAGNOSIS — R293 Abnormal posture: Secondary | ICD-10-CM | POA: Diagnosis not present

## 2017-08-28 DIAGNOSIS — R278 Other lack of coordination: Secondary | ICD-10-CM | POA: Diagnosis not present

## 2017-08-28 DIAGNOSIS — G808 Other cerebral palsy: Secondary | ICD-10-CM | POA: Diagnosis not present

## 2017-08-28 DIAGNOSIS — R2681 Unsteadiness on feet: Secondary | ICD-10-CM | POA: Diagnosis not present

## 2017-08-28 DIAGNOSIS — R498 Other voice and resonance disorders: Secondary | ICD-10-CM | POA: Diagnosis not present

## 2017-08-29 ENCOUNTER — Ambulatory Visit (INDEPENDENT_AMBULATORY_CARE_PROVIDER_SITE_OTHER): Payer: Medicare Other | Admitting: Pediatrics

## 2017-08-29 ENCOUNTER — Telehealth (INDEPENDENT_AMBULATORY_CARE_PROVIDER_SITE_OTHER): Payer: Self-pay | Admitting: Pediatrics

## 2017-08-29 ENCOUNTER — Encounter (INDEPENDENT_AMBULATORY_CARE_PROVIDER_SITE_OTHER): Payer: Self-pay | Admitting: Pediatrics

## 2017-08-29 DIAGNOSIS — G808 Other cerebral palsy: Secondary | ICD-10-CM

## 2017-08-29 NOTE — Telephone Encounter (Signed)
°  Who's calling (name and relationship to patient) : self Best contact number: [phone] Provider they see: Sharene Skeans Reason for call: Pt called requesting for someone from the clinic to help her at the main entrance of building, had already left clinic after being seen by Dr Sharene Skeans. Stayed with patient on the phone until clinical staff was able to assist her.

## 2017-08-29 NOTE — Progress Notes (Signed)
Patient: [name] [name] MRN: [id number] Sex: female DOB: 03-11-1976  Provider: Ellison Carwin, MD Location of Care: [facility] Child Neurology  Note type: Routine return visit  History of Present Illness: Referral Source: Melanie March, MD History from: patient and [facility] chart Chief Complaint: Spastic/Athenoid Quadriparesis  [name] [name] is a 41 y.o. female who returns today to empty refill and reprogram her intrathecal baclofen pump.  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  Over time I've gradually increased her baclofen and response to complaints of decreased mobility and spasticity.  She is responded by independently ambulating, most recently up to 450 feet, being able to get in and out of her wheelchair with less assistance, to dress and feed herself more independently.  She did not address any episodes of blackouts today.  These are usually present when she is under a lot of stress and I think are not epileptic.  She tells me that she is engaged to young man who also has spasticity and may be wheelchair-bound.  She also told me that her mother has stage IV breast cancer and is seriously ill.  Procedure: Reprogramming intrathecal baclofen pump  The device was interrogated and showed a complex continuous infusion of baclofen that has a basal rate of 18.74mcg per hour. The patient receives bolus doses of 27 mcg delivered at midnight, 6 AM, 12 noon, and 6 PM, for total of 494.1 mcg per day.I reprogrammed the device to 20.5 mcg/h which is a 10% increase in basal rate but just a 4% increase in her daily dose.  I did not change the doses of her bolus infusions.  The patient was sterilely prepped and draped. A 1-1/2 inch 22-gauge noncoring Huebner needle was inserted on the first pass. The apex of the reservoir is going horizontally to the left. 7.55mL was withdrawn from the pump placing it under partial vacuum. 40 mL of baclofen (concentration  2000 mcg/mL) was instilled into the pump through a Millipore filter. She tolerated the procedure well.  Her reservoir alarm date was September 19, 2017, but she complained that her legs were stiff.  Her new alarm date is January 27, 2018.  ERI is 39 months.  Review of Systems: 12 system review was assessed and was negative except as noted above and below  Past Medical History Past Medical History:  Diagnosis Date  . Abdominal pain 01/02/2012   Overview:  Overview:  Per Previous pcp note- Dr. Ta--Pt has history of endometriosis and had DUB in 2011.  She She had u/s that showed normal endometrial stripe.  She had endometrial bx that was wnl.  She has a lot of abd cramping every month.  This cramping was sometimes not relieved with hydrocodone.  She was referred to [facility] for IUD placement to help endometriosis and abd cramping.  [name]  . Abdominal pain 01/02/2012   Overview:  Overview:  Per Previous pcp note- Dr. Ta--Pt has history of endometriosis and had DUB in 2011.  She She had u/s that showed normal endometrial stripe.  She had endometrial bx that was wnl.  She has a lot of abd cramping every month.  This cramping was sometimes not relieved with hydrocodone.  She was referred to [facility] for IUD placement to help endometriosis and abd cramping.  [name] was placed 04/30/11 by [facility].  Pt still complains of abd cramping, which I am treating with hydrocodone and ultram. Pt reports some initial relief of pain from IUD, but states it  has now returned back to similar to previous cramping. Gave patient a shot of Toradol 60 mg IM today-since positive exacerbation of abdominal cramping during past 2 weeks. Physical exam reassuring. Patient may need to return to [facility] for further workup since she is plugged in with the GYN providers there for further treatment options regarding endometriosis and abdominal cramping. The dysfunctional uterine bleeding now well contr  . Acute cystitis with hematuria    . Acute cystitis without hematuria   . Acute respiratory failure with hypoxia ([name])   . Anemia   . Cellulitis 12/22/2014  . Cerebral palsy ([name])    Spastic Cerebral palsy, mentally intact  . Contracture, joint, multiple sites    Electric wheelchair, uses left hand to operate chair.   . Depression   . Disease of female genital organs 10/24/2010   Overview:  Overview:  Pt has history of endometriosis and had DUB in 2011.  She had u/s that showed normal endometrial stripe.  She had endometrial bx that was wnl.  She has a lot of abd cramping every month.  This cramping was sometimes not relieved with hydrocodone.  She was referred to [facility] for IUD placement to help endometriosis and abd cramping.  [name] was placed 04/30/11 by [facility].  Pt still complains of abd cramping, which was treated with hydrocodone and ultram.   Last seen at [facility] by OB/GYN 12/24/11, given Depo Lupron injection.  Ordering CT scan to r/o additional cause of abdominal pain (pt with known endometriosis and will not tolerate vaginal U/S, pain not improved s/p [name] or with Lupron injection-- only caused hot flashes and amenorrhea, no pain relief).  Also made referral to pain clinic for better pain control.  Do not think hysterectomy would help with pain, and concern for contamination of baclofen pump if did proceed with surgery.    8/5/13[name]: Pt was seen by   . [name]   . Dysarthria   . Dyspnea and respiratory abnormality 02/04/2011   Overview:  Overview:  Pt has history of recurrent PE and is on chronic coumadin.  Pt has been complaining of dyspnea that she describes as chest tightness.  She has been to the ER multiple times for this.  Usually she gets CTA and serial CE.  She has had at least 6 CTA in a period of since 2011.  She also has an O2 requirement. It is unclear why pt feels dyspneic, complains of chest tightness, or has O2 requirement.  I thought that her complaint may be from  deconditioning and referred her to PT.  Unfortunately Medicaid only pays for 4 PT visits.  Pt should be doing these exercises at home, but she has not.  I have referred her to [facility], and she has been seen by [name].  He did not understand the etiology of her dyspnea and O2 requirement.  He will continue to follow her.   Last Assessment & Plan:  Pt presents with concerns for chest tightness and dyspnea.  She was seen in ED yesterday and discharged home.  While there she had a CT chest 04/20/11 showed 1.  Multifocal degradation as detailed above.  N  . Endometriosis   . GERD (gastroesophageal reflux disease)   . Gram positive sepsis ([name]) 10/24/2014  . Gross hematuria 03/12/2013   Overview:  Overview:  status post replacement of suprapubic tube 03/04/2013  Last Assessment & Plan:  Recent urine culture collected in ED on 5/4 did not  result in significant growth. Moreover, patient has been asymptomatic with normal WBC, no fever and no true symptoms making symptomatic UTI less likely.  Will continue to monitor for any sign of infection.  Patient's xarelto had been held during her recent hospitalization due to hematuria. This has since been re-started. Monitor bleeding.    [name] Kitchen HCAP (healthcare-associated pneumonia) 10/25/2014  . History of anticoagulant therapy 10/21/2012  . History of endometriosis 10/2012   OBGYN [facility]: Laparoscopic Endometrial Ablation, TVH,  . History of recurrent UTIs   . Hypertension   . HYPERTENSION, BENIGN 01/31/2010   Qualifier: Diagnosis of  By: [name], MD, [name] Incontinent of feces 03/11/2016  . Infantile cerebral palsy ([name]) 01/29/2007   Overview:  She has spastic CP.  She is mentally intact.  She is wheelchair bound.  She is wheelchair bound for ambulation      . Intracervical pessary 05/07/2011   Overview:  Overview:  Placed by [facility] 04/30/11 to treat DUB and Endometriosis.  She is seen at GYN clinic at [facility] but was considered a poor surgical  candidate and referred her to [facility].  For DUB she had pelvic ultrasound that showed thin stripe and she had endometrial Bx by Dr Jennette Kettle in GYN clinic, which was negative.     . Macroscopic hematuria 03/10/2013   status post replacement of suprapubic tube 03/04/2013   . Migraine   . OCD (obsessive compulsive disorder)   . Parapneumonic effusion 10/25/2014  . Presence of intrathecal baclofen pump 03/07/2014   R LQ. Insertion T10.    [name] Kitchen Psychiatric illness 10/31/2014  . Pulmonary embolism ([name])    Lifetime Coumadin  . Pulmonary embolism ([name]) 2011, 01/2011   Will be on lifetime coumadin  . Recurrent pulmonary embolism ([name]) 05/03/2011   Pt has history of recurrent PE and is on chronic coumadin.  Pt has been complaining of dyspnea that she describes as chest tightness.  She has been to the ER multiple times for this.  Usually she gets CTA and serial CE.  She has had at least 6 CTA in a period of since 2011.  She also has an O2 requirement. It is unclear why pt feels dyspneic, complains of chest tightness, or has O2 requirement.  I thought that her complaint may be from deconditioning and referred her to PT.  Unfortunately Medicaid only pays for 4 PT visits.  Pt should be doing these exercises at home, but she has not.  I have referred her to [facility], and she has been seen by [name].  He did not understand the etiology of her dyspnea and O2 requirement.  He will continue to follow her.     . Seborrheic keratosis 01/18/2011  . Seizures ([name]) 02/12/2016  . Spasticity 01/05/2014  . Transient alteration of awareness, recurrent 09/08/2006   Recurrent episodes where Kaylin  loses contact with her world and that have been studied thoroughly and appear nonepileptic in nature per Dr Terrace Arabia (neurology) The patient has had episodes starting in 2007 that initially began 1-4 times a day during which time she would have periods of unawareness followed by confusion. This continuous video EEG monitoring performed from October 8-12,  2007. Had   . Urethra dilated and patulous 07/03/2015   Patulous, Dilated Urethra. 5mL balloon on a 22-Fr catheter hoping this would prevent the bladder spasm from pushing the balloon down the urethra (Dr Logan Bores, Urology [facility], July 2016)    Hospitalizations: No., Head Injury: No.,  Nervous System Infections: No., Immunizations up to date: Yes.    Birth History She was extremely premature infant which is the cause of her motor disorder.  Behavior History anxiety, depression, non-epileptic events  Surgical History Procedure Laterality Date  . ABDOMINAL HYSTERECTOMY    . APPENDECTOMY    . BACLOFEN PUMP REFILL     x 3 times  . CARPAL TUNNEL RELEASE  08/2008   Dr Teressa Senter  . CESAREAN SECTION     x 2  . CHOLECYSTECTOMY  10/14/2015   Procedure: LAPAROSCOPIC CHOLECYSTECTOMY;  Surgeon: Violeta Gelinas, MD;  Location: [facility] OR;  Service: General;;  . COLPOSCOPY  06/2000  . INTRAUTERINE DEVICE INSERTION  04/30/11   Inserted by [facility]YN for endometriosis  . laparoscopically assisted Vag Hysterectomy  10/27/2012  . MULTIPLE EXTRACTIONS WITH ALVEOLOPLASTY Bilateral 07/19/2013   Procedure: EXTRACTIONS #4, 2,95,62,13;  Surgeon: Francene Finders, DDS;  Location: [facility] OR;  Service: Oral Surgery;  Laterality: Bilateral;  . NEUROMA SURGERY Left    anterior and posterior intersosseous  . PAIN PUMP IMPLANTATION N/A 03/07/2014   Procedure: baclofen pump revision/replacement and Catheter connection replacement;  Surgeon: Cristi Loron, MD;  Location: [facility];  Service: Neurosurgery;  Laterality: N/A;  baclofen pump revision/replacement and Catheter connection replacement  . PROGRAMABLE BACLOFEN PUMP REVISION  03/17/14   Battery Replacement   . TUBAL LIGATION  2003  . URETHRA SURGERY    . WRIST SURGERY  06/2010   Dr Dierdre Searles, hand surgeon, [name]   Family History family history includes Alzheimer's disease in her paternal grandfather; Asthma in her father; Breast cancer in her mother and paternal  grandmother; Colon cancer in her maternal grandmother; Heart attack in her maternal grandfather. Family history is negative for migraines, seizures, intellectual disabilities, blindness, deafness, birth defects, chromosomal disorder, or autism.  Social History . Marital status: Divorced  . Number of children: 2  . Years of education: college   Occupational History  .  Unemployed   Social History Main Topics  . Smoking status: Never Smoker  . Smokeless tobacco: Never Used  . Alcohol use 0.6 oz/week    1 Glasses of wine per week     Comment: Drinks alcohol once a month.  . Drug use: No  . 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     She enjoys shopping.    Lives in a nursing facility,[facility] since 02/11/16       Ailene Ards, who has schizophrenia, MR, is father of daughter.  Homero Fellers and pt are no longer together.  Homero Fellers does not see [name] [name]. Daughter is 55 yo, Stage manager, lives with pt.         [name] [phone]), lives with pt's parents.       Needs assistance with ADL's and IADL's--has personal care services 20 hrs/wk;        **Patient very concerned about her kids being taken from her.       **Case Manager: Jack Quarto [phone]       Patient lives [facility]   Allergies Allergen Reactions  . Morphine Dermatitis and Other (See Comments)    Skin turned red   Physical Exam LMP 04/05/2011   I did not examine [name] [name] today  Assessment 1.  Cerebral palsy, quadriplegic, G80.8. 2.  Athetoid cerebral palsy, G80.3  Discussion [name] [name] has made good progress.  Because she has  become more mobile as I've increased her dose, I'm willing to increase it further.  I told her however that she needs some spasticity in order to remain upright and if I take away too much of it, she won't be able to bear weight.  I asked her to contact me if that happens and I  will cut the dose back.  Plan Return visit in January 27, 2018 to empty, refill, and reprogram her intrathecal baclofen pump.   Medication List   Accurate as of 08/29/17 10:20 AM.      acetaminophen 325 MG tablet Commonly known as:  TYLENOL Take 650 mg by mouth daily as needed for moderate pain.   amantadine 100 MG capsule Commonly known as:  SYMMETREL Take 100 mg by mouth daily.   antiseptic oral rinse Liqd 15 mLs by Mouth Rinse route as needed for dry mouth.   baclofen 20 MG tablet Commonly known as:  LIORESAL Take 20 mg by mouth 4 (four) times daily. For bladder spasms   bisacodyl 10 MG suppository Commonly known as:  DULCOLAX Place 10 mg rectally as needed for moderate constipation.   cephALEXin 500 MG capsule Commonly known as:  KEFLEX Take 1 capsule (500 mg total) by mouth 2 (two) times daily.   CERTA-VITE PO Take 1 tablet by mouth daily.   clonazePAM 0.5 MG tablet Commonly known as:  KLONOPIN Take 0.5 mg by mouth 3 (three) times daily.   FLUoxetine HCl 60 MG Tabs Take 60 mg by mouth daily.   lactose free nutrition Liqd Take 237 mLs by mouth 2 (two) times daily between meals.   lamoTRIgine 100 MG tablet Commonly known as:  LAMICTAL Take 100 mg by mouth at bedtime.   lubiprostone 24 MCG capsule Commonly known as:  AMITIZA Take 1 capsule (24 mcg total) by mouth 2 (two) times daily with a meal.   Melatonin 5 MG Tabs Take 5 mg by mouth at bedtime.   oxybutynin 5 MG tablet Commonly known as:  DITROPAN Take 5 mg by mouth 4 (four) times daily.   oxyCODONE 5 MG immediate release tablet Commonly known as:  ROXICODONE Take one tablet by mouth every 4 hours as needed for pain   polyethylene glycol powder powder Commonly known as:  GLYCOLAX/MIRALAX Take 1  1/2 dose  daily in 12 ounces of fluid every day.   QUEtiapine 100 MG tablet Commonly known as:  SEROQUEL Take 1 tablet (100 mg total) by mouth at bedtime.   tiZANidine 4 MG capsule Commonly known  as:  ZANAFLEX Take 4 mg by mouth 2 (two) times daily.   tretinoin 0.05 % cream Commonly known as:  RETIN-A Apply 1 application topically at bedtime. face   warfarin 6 MG tablet Commonly known as:  COUMADIN Take 6 mg by mouth daily at 6 PM.    The medication list was reviewed and reconciled. All changes or newly prescribed medications were explained.  A complete medication list was provided to the patient/caregiver.  Deetta Perla MD

## 2017-09-01 DIAGNOSIS — R339 Retention of urine, unspecified: Secondary | ICD-10-CM | POA: Diagnosis not present

## 2017-09-01 DIAGNOSIS — R482 Apraxia: Secondary | ICD-10-CM | POA: Diagnosis not present

## 2017-09-01 DIAGNOSIS — R293 Abnormal posture: Secondary | ICD-10-CM | POA: Diagnosis not present

## 2017-09-01 DIAGNOSIS — M6281 Muscle weakness (generalized): Secondary | ICD-10-CM | POA: Diagnosis not present

## 2017-09-01 DIAGNOSIS — M24531 Contracture, right wrist: Secondary | ICD-10-CM | POA: Diagnosis not present

## 2017-09-01 DIAGNOSIS — R498 Other voice and resonance disorders: Secondary | ICD-10-CM | POA: Diagnosis not present

## 2017-09-01 DIAGNOSIS — R278 Other lack of coordination: Secondary | ICD-10-CM | POA: Diagnosis not present

## 2017-09-01 DIAGNOSIS — R2681 Unsteadiness on feet: Secondary | ICD-10-CM | POA: Diagnosis not present

## 2017-09-01 DIAGNOSIS — G808 Other cerebral palsy: Secondary | ICD-10-CM | POA: Diagnosis not present

## 2017-09-02 DIAGNOSIS — R498 Other voice and resonance disorders: Secondary | ICD-10-CM | POA: Diagnosis not present

## 2017-09-02 DIAGNOSIS — R2681 Unsteadiness on feet: Secondary | ICD-10-CM | POA: Diagnosis not present

## 2017-09-02 DIAGNOSIS — M6281 Muscle weakness (generalized): Secondary | ICD-10-CM | POA: Diagnosis not present

## 2017-09-02 DIAGNOSIS — R278 Other lack of coordination: Secondary | ICD-10-CM | POA: Diagnosis not present

## 2017-09-02 DIAGNOSIS — G808 Other cerebral palsy: Secondary | ICD-10-CM | POA: Diagnosis not present

## 2017-09-02 DIAGNOSIS — R293 Abnormal posture: Secondary | ICD-10-CM | POA: Diagnosis not present

## 2017-09-03 DIAGNOSIS — G808 Other cerebral palsy: Secondary | ICD-10-CM | POA: Diagnosis not present

## 2017-09-03 DIAGNOSIS — M6281 Muscle weakness (generalized): Secondary | ICD-10-CM | POA: Diagnosis not present

## 2017-09-03 DIAGNOSIS — R293 Abnormal posture: Secondary | ICD-10-CM | POA: Diagnosis not present

## 2017-09-03 DIAGNOSIS — R498 Other voice and resonance disorders: Secondary | ICD-10-CM | POA: Diagnosis not present

## 2017-09-03 DIAGNOSIS — R278 Other lack of coordination: Secondary | ICD-10-CM | POA: Diagnosis not present

## 2017-09-03 DIAGNOSIS — R2681 Unsteadiness on feet: Secondary | ICD-10-CM | POA: Diagnosis not present

## 2017-09-04 DIAGNOSIS — R2681 Unsteadiness on feet: Secondary | ICD-10-CM | POA: Diagnosis not present

## 2017-09-04 DIAGNOSIS — M6281 Muscle weakness (generalized): Secondary | ICD-10-CM | POA: Diagnosis not present

## 2017-09-04 DIAGNOSIS — R498 Other voice and resonance disorders: Secondary | ICD-10-CM | POA: Diagnosis not present

## 2017-09-04 DIAGNOSIS — R293 Abnormal posture: Secondary | ICD-10-CM | POA: Diagnosis not present

## 2017-09-04 DIAGNOSIS — G808 Other cerebral palsy: Secondary | ICD-10-CM | POA: Diagnosis not present

## 2017-09-04 DIAGNOSIS — R278 Other lack of coordination: Secondary | ICD-10-CM | POA: Diagnosis not present

## 2017-09-08 DIAGNOSIS — M6281 Muscle weakness (generalized): Secondary | ICD-10-CM | POA: Diagnosis not present

## 2017-09-08 DIAGNOSIS — G808 Other cerebral palsy: Secondary | ICD-10-CM | POA: Diagnosis not present

## 2017-09-08 DIAGNOSIS — R293 Abnormal posture: Secondary | ICD-10-CM | POA: Diagnosis not present

## 2017-09-08 DIAGNOSIS — R278 Other lack of coordination: Secondary | ICD-10-CM | POA: Diagnosis not present

## 2017-09-08 DIAGNOSIS — R2681 Unsteadiness on feet: Secondary | ICD-10-CM | POA: Diagnosis not present

## 2017-09-08 DIAGNOSIS — R498 Other voice and resonance disorders: Secondary | ICD-10-CM | POA: Diagnosis not present

## 2017-09-09 DIAGNOSIS — M6281 Muscle weakness (generalized): Secondary | ICD-10-CM | POA: Diagnosis not present

## 2017-09-09 DIAGNOSIS — R2681 Unsteadiness on feet: Secondary | ICD-10-CM | POA: Diagnosis not present

## 2017-09-09 DIAGNOSIS — R278 Other lack of coordination: Secondary | ICD-10-CM | POA: Diagnosis not present

## 2017-09-09 DIAGNOSIS — R498 Other voice and resonance disorders: Secondary | ICD-10-CM | POA: Diagnosis not present

## 2017-09-09 DIAGNOSIS — R293 Abnormal posture: Secondary | ICD-10-CM | POA: Diagnosis not present

## 2017-09-09 DIAGNOSIS — G808 Other cerebral palsy: Secondary | ICD-10-CM | POA: Diagnosis not present

## 2017-09-10 DIAGNOSIS — R2681 Unsteadiness on feet: Secondary | ICD-10-CM | POA: Diagnosis not present

## 2017-09-10 DIAGNOSIS — G808 Other cerebral palsy: Secondary | ICD-10-CM | POA: Diagnosis not present

## 2017-09-10 DIAGNOSIS — M6281 Muscle weakness (generalized): Secondary | ICD-10-CM | POA: Diagnosis not present

## 2017-09-10 DIAGNOSIS — R278 Other lack of coordination: Secondary | ICD-10-CM | POA: Diagnosis not present

## 2017-09-10 DIAGNOSIS — R498 Other voice and resonance disorders: Secondary | ICD-10-CM | POA: Diagnosis not present

## 2017-09-10 DIAGNOSIS — R293 Abnormal posture: Secondary | ICD-10-CM | POA: Diagnosis not present

## 2017-09-13 DIAGNOSIS — R293 Abnormal posture: Secondary | ICD-10-CM | POA: Diagnosis not present

## 2017-09-13 DIAGNOSIS — G808 Other cerebral palsy: Secondary | ICD-10-CM | POA: Diagnosis not present

## 2017-09-13 DIAGNOSIS — R2681 Unsteadiness on feet: Secondary | ICD-10-CM | POA: Diagnosis not present

## 2017-09-13 DIAGNOSIS — M6281 Muscle weakness (generalized): Secondary | ICD-10-CM | POA: Diagnosis not present

## 2017-09-13 DIAGNOSIS — R498 Other voice and resonance disorders: Secondary | ICD-10-CM | POA: Diagnosis not present

## 2017-09-13 DIAGNOSIS — R278 Other lack of coordination: Secondary | ICD-10-CM | POA: Diagnosis not present

## 2017-09-14 ENCOUNTER — Telehealth (INDEPENDENT_AMBULATORY_CARE_PROVIDER_SITE_OTHER): Payer: Self-pay | Admitting: Pediatrics

## 2017-09-14 NOTE — Telephone Encounter (Signed)
[name] [name] called the on call service today and I called her back due to concern of memory.  She reports that she went to church and then lunch today, and then forgot that she had eaten.  She also had an event on Friday where she didn't remember things.  It was difficult to understand her due to dysarthria and tearfulness. She denies recent medication changes, denies illness.   I reassured her that she would be ok for today and that memory troubles are not usually an emergency and can sometimes come and go.  She confirmed she sees both [name] and Dr Sharene Skeans in the past.  I let her know Dr Sharene Skeans is out of town, but I will have [name] call her tomorrow because she knows [name] [name] better and will be better able to assist her.  We will try to get her in for an appointment as soon as possible if necessary, she says she is not able to come to the clinic on Monday.  Patient was reassured with this advice and will wait until tomorrow to discuss further.     Lorenz Coaster MD MPH

## 2017-09-15 ENCOUNTER — Telehealth (INDEPENDENT_AMBULATORY_CARE_PROVIDER_SITE_OTHER): Payer: Self-pay | Admitting: Pediatrics

## 2017-09-15 NOTE — Telephone Encounter (Signed)
[name] [name] called to follow up on her conversation with Dr Artis Flock yesterday. She said that she was having problems with not remembering blocks of time on the weekend and last week. I talked with her and asked if she had been taking any new medication, or any pain medicine or medication for anxiety during these blocks of time. She said that she could not remember. I told [name] [name] that she had experienced similar problems in the past when she had been under stress, and reminded her that sometimes not remembering things was a coping mechanism. [name] [name] disagreed and said that she felt like something new was wrong with her. I told her that I would be happy to see her but she said that she could not come in for a visit. I told her that I would be happy to see her when she could make an appointment, but in the mean time to work on relaxation and rest. She agreed with this plan. TG

## 2017-09-15 NOTE — Telephone Encounter (Signed)
  Who's calling (name and relationship to patient) : Kasondra, self  Best contact number: [phone]  Provider they see: Sharene Skeans  Reason for call: Fallynn called in asking to speak with Dr. Sharene Skeans.  I informed her that Dr. Sharene Skeans is out of the office until Friday and I asked if she would like to speak with [name].  She stated she has already spoke with [name] and will wait till Friday to speak with Dr. Sharene Skeans.     PRESCRIPTION REFILL ONLY  Name of prescription:  Pharmacy:

## 2017-09-16 DIAGNOSIS — R498 Other voice and resonance disorders: Secondary | ICD-10-CM | POA: Diagnosis not present

## 2017-09-16 DIAGNOSIS — R2681 Unsteadiness on feet: Secondary | ICD-10-CM | POA: Diagnosis not present

## 2017-09-16 DIAGNOSIS — G808 Other cerebral palsy: Secondary | ICD-10-CM | POA: Diagnosis not present

## 2017-09-16 DIAGNOSIS — M6281 Muscle weakness (generalized): Secondary | ICD-10-CM | POA: Diagnosis not present

## 2017-09-16 DIAGNOSIS — R278 Other lack of coordination: Secondary | ICD-10-CM | POA: Diagnosis not present

## 2017-09-16 DIAGNOSIS — R293 Abnormal posture: Secondary | ICD-10-CM | POA: Diagnosis not present

## 2017-09-17 DIAGNOSIS — G808 Other cerebral palsy: Secondary | ICD-10-CM | POA: Diagnosis not present

## 2017-09-17 DIAGNOSIS — R293 Abnormal posture: Secondary | ICD-10-CM | POA: Diagnosis not present

## 2017-09-17 DIAGNOSIS — M6281 Muscle weakness (generalized): Secondary | ICD-10-CM | POA: Diagnosis not present

## 2017-09-17 DIAGNOSIS — R498 Other voice and resonance disorders: Secondary | ICD-10-CM | POA: Diagnosis not present

## 2017-09-17 DIAGNOSIS — R278 Other lack of coordination: Secondary | ICD-10-CM | POA: Diagnosis not present

## 2017-09-17 DIAGNOSIS — R2681 Unsteadiness on feet: Secondary | ICD-10-CM | POA: Diagnosis not present

## 2017-09-19 DIAGNOSIS — R498 Other voice and resonance disorders: Secondary | ICD-10-CM | POA: Diagnosis not present

## 2017-09-19 DIAGNOSIS — G808 Other cerebral palsy: Secondary | ICD-10-CM | POA: Diagnosis not present

## 2017-09-19 DIAGNOSIS — L731 Pseudofolliculitis barbae: Secondary | ICD-10-CM | POA: Diagnosis not present

## 2017-09-19 DIAGNOSIS — M6281 Muscle weakness (generalized): Secondary | ICD-10-CM | POA: Diagnosis not present

## 2017-09-19 DIAGNOSIS — R293 Abnormal posture: Secondary | ICD-10-CM | POA: Diagnosis not present

## 2017-09-19 DIAGNOSIS — R2681 Unsteadiness on feet: Secondary | ICD-10-CM | POA: Diagnosis not present

## 2017-09-19 DIAGNOSIS — R278 Other lack of coordination: Secondary | ICD-10-CM | POA: Diagnosis not present

## 2017-09-19 DIAGNOSIS — I2699 Other pulmonary embolism without acute cor pulmonale: Secondary | ICD-10-CM | POA: Diagnosis not present

## 2017-09-19 NOTE — Telephone Encounter (Signed)
I left a message for treated call back on Monday.

## 2017-09-25 DIAGNOSIS — G248 Other dystonia: Secondary | ICD-10-CM | POA: Diagnosis not present

## 2017-09-25 DIAGNOSIS — M4152 Other secondary scoliosis, cervical region: Secondary | ICD-10-CM | POA: Diagnosis not present

## 2017-09-25 DIAGNOSIS — G800 Spastic quadriplegic cerebral palsy: Secondary | ICD-10-CM | POA: Diagnosis not present

## 2017-09-25 DIAGNOSIS — M2450 Contracture, unspecified joint: Secondary | ICD-10-CM | POA: Diagnosis not present

## 2017-09-27 ENCOUNTER — Emergency Department (HOSPITAL_COMMUNITY)
Admission: EM | Admit: 2017-09-27 | Discharge: 2017-09-27 | Disposition: A | Discharge status: Skilled Nursing Facility | Payer: Medicare Other | Attending: Emergency Medicine | Admitting: Emergency Medicine

## 2017-09-27 ENCOUNTER — Encounter (HOSPITAL_COMMUNITY): Payer: Self-pay

## 2017-09-27 DIAGNOSIS — R339 Retention of urine, unspecified: Secondary | ICD-10-CM | POA: Diagnosis not present

## 2017-09-27 DIAGNOSIS — Y829 Unspecified medical devices associated with adverse incidents: Secondary | ICD-10-CM | POA: Diagnosis not present

## 2017-09-27 DIAGNOSIS — I10 Essential (primary) hypertension: Secondary | ICD-10-CM | POA: Insufficient documentation

## 2017-09-27 DIAGNOSIS — Z79899 Other long term (current) drug therapy: Secondary | ICD-10-CM | POA: Diagnosis not present

## 2017-09-27 DIAGNOSIS — Z7901 Long term (current) use of anticoagulants: Secondary | ICD-10-CM | POA: Insufficient documentation

## 2017-09-27 DIAGNOSIS — T83028A Displacement of other indwelling urethral catheter, initial encounter: Secondary | ICD-10-CM | POA: Insufficient documentation

## 2017-09-27 DIAGNOSIS — T83010A Breakdown (mechanical) of cystostomy catheter, initial encounter: Secondary | ICD-10-CM

## 2017-09-27 DIAGNOSIS — T83098A Other mechanical complication of other indwelling urethral catheter, initial encounter: Secondary | ICD-10-CM | POA: Diagnosis not present

## 2017-09-27 DIAGNOSIS — T83091A Other mechanical complication of indwelling urethral catheter, initial encounter: Secondary | ICD-10-CM | POA: Diagnosis not present

## 2017-09-27 NOTE — Discharge Instructions (Signed)
Please follow up as needed. See your doctor or return if fever, urinary symptoms, any problems with catheter

## 2017-09-27 NOTE — ED Notes (Signed)
Attempt to irrigate foley. Unable to irrigate.

## 2017-09-27 NOTE — ED Triage Notes (Signed)
She comes to us with c/o of "clogged (suprapubic) catheter". Staff at her nursing home were unable to tell how long this has been the case. She is in no distress.

## 2017-09-27 NOTE — ED Provider Notes (Signed)
[facility]-EMERGENCY DEPT Provider Note   CSN: [id number] Arrival date & time: 09/27/17  1134     History   Chief Complaint Chief Complaint  Patient presents with  . Urinary Retention    HPI [name] [name] is a 41 y.o. female.  HPI [name] [name] is a 41 y.o. female with history of cerebral palsy, suprapubic catheter, presents to emergency department with possible occluded suprapubic cath.  Patient states she feels her current abdomen is distended and having bladder spasms.  Nurses at our facility tried to irrigate the Foley with no success.  History of similar episodes in the past which required Foley replacement.  Patient denies any other complaints.  No fever or chills.  No nausea or vomiting.  No back pain.  Past Medical History:  Diagnosis Date  . Abdominal pain 01/02/2012   Overview:  Overview:  Per Previous pcp note- Dr. Ta--Pt has history of endometriosis and had DUB in 2011.  She She had u/s that showed normal endometrial stripe.  She had endometrial bx that was wnl.  She has a lot of abd cramping every month.  This cramping was sometimes not relieved with hydrocodone.  She was referred to [facility] for IUD placement to help endometriosis and abd cramping.  [name]  . Abdominal pain 01/02/2012   Overview:  Overview:  Per Previous pcp note- Dr. Ta--Pt has history of endometriosis and had DUB in 2011.  She She had u/s that showed normal endometrial stripe.  She had endometrial bx that was wnl.  She has a lot of abd cramping every month.  This cramping was sometimes not relieved with hydrocodone.  She was referred to [facility] for IUD placement to help endometriosis and abd cramping.  [name] was placed 04/30/11 by [facility].  Pt still complains of abd cramping, which I am treating with hydrocodone and ultram. Pt reports some initial relief of pain from IUD, but states it has now returned back to similar to previous cramping. Gave patient a shot of Toradol 60 mg IM  today-since positive exacerbation of abdominal cramping during past 2 weeks. Physical exam reassuring. Patient may need to return to [facility] for further workup since she is plugged in with the GYN providers there for further treatment options regarding endometriosis and abdominal cramping. The dysfunctional uterine bleeding now well contr  . Acute cystitis with hematuria   . Acute cystitis without hematuria   . Acute respiratory failure with hypoxia ([name])   . Anemia   . Cellulitis 12/22/2014  . Cerebral palsy ([name])    Spastic Cerebral palsy, mentally intact  . Contracture, joint, multiple sites    Electric wheelchair, uses left hand to operate chair.   . Depression   . Disease of female genital organs 10/24/2010   Overview:  Overview:  Pt has history of endometriosis and had DUB in 2011.  She had u/s that showed normal endometrial stripe.  She had endometrial bx that was wnl.  She has a lot of abd cramping every month.  This cramping was sometimes not relieved with hydrocodone.  She was referred to [facility] for IUD placement to help endometriosis and abd cramping.  [name] was placed 04/30/11 by [facility].  Pt still complains of abd cramping, which was treated with hydrocodone and ultram.   Last seen at [facility] by OB/GYN 12/24/11, given Depo Lupron injection.  Ordering CT scan to r/o additional cause of abdominal pain (pt with known endometriosis and will not tolerate vaginal U/S, pain  not improved s/p [name] or with Lupron injection-- only caused hot flashes and amenorrhea, no pain relief).  Also made referral to pain clinic for better pain control.  Do not think hysterectomy would help with pain, and concern for contamination of baclofen pump if did proceed with surgery.    8/5/13[name]: Pt was seen by   . [name]   . Dysarthria   . Dyspnea and respiratory abnormality 02/04/2011   Overview:  Overview:  Pt has history of recurrent PE and is on chronic coumadin.  Pt  has been complaining of dyspnea that she describes as chest tightness.  She has been to the ER multiple times for this.  Usually she gets CTA and serial CE.  She has had at least 6 CTA in a period of since 2011.  She also has an O2 requirement. It is unclear why pt feels dyspneic, complains of chest tightness, or has O2 requirement.  I thought that her complaint may be from deconditioning and referred her to PT.  Unfortunately Medicaid only pays for 4 PT visits.  Pt should be doing these exercises at home, but she has not.  I have referred her to [facility], and she has been seen by [name].  He did not understand the etiology of her dyspnea and O2 requirement.  He will continue to follow her.   Last Assessment & Plan:  Pt presents with concerns for chest tightness and dyspnea.  She was seen in ED yesterday and discharged home.  While there she had a CT chest 04/20/11 showed 1.  Multifocal degradation as detailed above.  N  . Endometriosis   . GERD (gastroesophageal reflux disease)   . Gram positive sepsis ([name]) 10/24/2014  . Gross hematuria 03/12/2013   Overview:  Overview:  status post replacement of suprapubic tube 03/04/2013  Last Assessment & Plan:  Recent urine culture collected in ED on 5/4 did not result in significant growth. Moreover, patient has been asymptomatic with normal WBC, no fever and no true symptoms making symptomatic UTI less likely.  Will continue to monitor for any sign of infection.  Patient's xarelto had been held during her recent hospitalization due to hematuria. This has since been re-started. Monitor bleeding.    [name] Kitchen HCAP (healthcare-associated pneumonia) 10/25/2014  . History of anticoagulant therapy 10/21/2012  . History of endometriosis 10/2012   OBGYN [facility]: Laparoscopic Endometrial Ablation, TVH,  . History of recurrent UTIs   . Hypertension   . HYPERTENSION, BENIGN 01/31/2010   Qualifier: Diagnosis of  By: [name], MD, [name] Incontinent of feces 03/11/2016  .  Infantile cerebral palsy ([name]) 01/29/2007   Overview:  She has spastic CP.  She is mentally intact.  She is wheelchair bound.  She is wheelchair bound for ambulation      . Intracervical pessary 05/07/2011   Overview:  Overview:  Placed by [facility] 04/30/11 to treat DUB and Endometriosis.  She is seen at GYN clinic at [facility] but was considered a poor surgical candidate and referred her to [facility].  For DUB she had pelvic ultrasound that showed thin stripe and she had endometrial Bx by Dr Jennette Kettle in GYN clinic, which was negative.     . Macroscopic hematuria 03/10/2013   status post replacement of suprapubic tube 03/04/2013   . Migraine   . OCD (obsessive compulsive disorder)   . Parapneumonic effusion 10/25/2014  . Presence of intrathecal baclofen pump 03/07/2014  R LQ. Insertion T10.    [name] Kitchen Psychiatric illness 10/31/2014  . Pulmonary embolism ([name])    Lifetime Coumadin  . Pulmonary embolism ([name]) 2011, 01/2011   Will be on lifetime coumadin  . Recurrent pulmonary embolism ([name]) 05/03/2011   Pt has history of recurrent PE and is on chronic coumadin.  Pt has been complaining of dyspnea that she describes as chest tightness.  She has been to the ER multiple times for this.  Usually she gets CTA and serial CE.  She has had at least 6 CTA in a period of since 2011.  She also has an O2 requirement. It is unclear why pt feels dyspneic, complains of chest tightness, or has O2 requirement.  I thought that her complaint may be from deconditioning and referred her to PT.  Unfortunately Medicaid only pays for 4 PT visits.  Pt should be doing these exercises at home, but she has not.  I have referred her to [facility], and she has been seen by [name].  He did not understand the etiology of her dyspnea and O2 requirement.  He will continue to follow her.     . Seborrheic keratosis 01/18/2011  . Seizures ([name]) 02/12/2016  . Spasticity 01/05/2014  . Transient alteration of awareness, recurrent 09/08/2006   Recurrent  episodes where Betti  loses contact with her world and that have been studied thoroughly and appear nonepileptic in nature per Dr Terrace Arabia (neurology) The patient has had episodes starting in 2007 that initially began 1-4 times a day during which time she would have periods of unawareness followed by confusion. This continuous video EEG monitoring performed from October 8-12, 2007. Had   . Urethra dilated and patulous 07/03/2015   Patulous, Dilated Urethra. 5mL balloon on a 22-Fr catheter hoping this would prevent the bladder spasm from pushing the balloon down the urethra (Dr Logan Bores, Urology [facility], July 2016)     Patient Active Problem List   Diagnosis Date Noted  . Incontinent of feces 03/11/2016  . Chronic anticoagulation 03/11/2016  . Involuntary movements 02/22/2016  . Seizures ([name]) 02/12/2016  . Dyspnea 02/12/2016  . Cerebral palsy, quadriplegic ([name]) 02/06/2016  . Swelling of extremity 01/13/2016  . Pressure ulcer 10/09/2015  . Status post insertion of inferior vena caval filter 09/28/2015  . Absence of bladder continence 08/25/2015  . Person living in residential institution 06/14/2015  . Pulmonary hypertension ([name])   . Anemia of chronic disease   . Spasticity 03/15/2015  . Constipation, chronic 10/10/2014  . Athetoid cerebral palsy ([name]) 04/13/2014  . Dystonia 04/12/2014  . Presence of intrathecal baclofen pump 03/07/2014  . Dental caries 07/18/2013  . Multiple thyroid nodules 05/20/2013  . Osteoarthrosis 04/07/2013  . [name]   . Dysarthria   . GERD (gastroesophageal reflux disease)   . Neurogenic bladder 02/05/2013  . Anxiety state 10/21/2012  . Chronic pain syndrome 06/17/2012  . Recurrent pulmonary embolism ([name]) 05/03/2011  . Pulmonary embolism with infarction ([name]) 05/03/2011  . Chronic suprapubic catheter ([name]) 03/15/2011  . Disease of female genital organs 10/24/2010  . HYPERTENSION, BENIGN 01/31/2010  . CP (cerebral palsy), spastic ([name])  12/27/2008  . Contracted, joint, multiple sites 12/27/2008  . Major depressive disorder, recurrent episode ([name]) 01/29/2007  . Obsessive Compulsive Disorder 01/29/2007  . Transient alteration of awareness, recurrent 09/08/2006    Past Surgical History:  Procedure Laterality Date  . ABDOMINAL HYSTERECTOMY    . APPENDECTOMY    . BACLOFEN PUMP REFILL  x 3 times  . CARPAL TUNNEL RELEASE  08/2008   Dr Teressa SenterSypher  . CESAREAN SECTION     x 2  . CHOLECYSTECTOMY  10/14/2015   Procedure: LAPAROSCOPIC CHOLECYSTECTOMY;  Surgeon: Violeta GelinasBurke Thompson, MD;  [facility];  Service: General;;  . COLPOSCOPY  06/2000  . INTRAUTERINE DEVICE INSERTION  04/30/11   [facility] endometri[facility]  . laparoscopically assisted Vag Hysterectomy  10/27/2012  . MULTIPLE EXTRACTIONS WITH ALVEOLOPLASTY Bilateral 07/19/2013   Procedure: EXTRACTIONS #4, 1,61,09,605,11,16,19;  Surgeon: Francene Findershristopher L Hartshorne, DDS;  [facility];  Service: Oral Surgery;  Laterality: Bilateral;  . NEUROMA SURGERY Left    anterior and posterior intersosseous  . PAIN PUMP IMPLANTATION N/A 03/07/2014   Procedure: baclofen pump revision/replacement and Catheter connection replacement;  Surgeon: Cristi LoronJeffrey D Jenkins, MD;  Location: [facility] ORS;  Service: Neurosurgery;  Laterality: N/A;  baclofen pump revision/replacement and Catheter connection replacement  . PROGRAMABLE BACLOFEN PUMP REVISION  03/17/14   Battery Replacement   . TUBAL LIGATION  2003  . URETHRA SURGERY    . WRIST SURGERY  06/2010   Dr Dierdre SearlesLi[facility]    OB History    No data available       Home Medications    Prior to Admission medications   Medication Sig Start Date End Date Taking? Authorizing Provider  acetaminophen (TYLENOL) 325 MG tablet Take 650 mg by mouth daily as needed for moderate pain.     [provider]  amantadine (SYMMETREL) 100 MG capsule Take 100 mg by mouth daily.     [provider]  antiseptic oral rinse (BIOTENE)  LIQD 15 mLs by Mouth Rinse route as needed for dry mouth.    [provider]  baclofen (LIORESAL) 20 MG tablet Take 20 mg by mouth 4 (four) times daily. For bladder spasms    [provider]  bisacodyl (DULCOLAX) 10 MG suppository Place 10 mg rectally as needed for moderate constipation.    [provider]  cephALEXin (KEFLEX) 500 MG capsule Take 1 capsule (500 mg total) by mouth 2 (two) times daily. 06/13/17   Khatri, Hina, PA-C  clonazePAM (KLONOPIN) 0.5 MG tablet Take 0.5 mg by mouth 3 (three) times daily.     [provider]  FLUoxetine HCl 60 MG TABS Take 60 mg by mouth daily.     [provider]  lactose free nutrition (BOOST) LIQD Take 237 mLs by mouth 2 (two) times daily between meals.    [provider]  lamoTRIgine (LAMICTAL) 100 MG tablet Take 100 mg by mouth at bedtime.    [provider]  lubiprostone (AMITIZA) 24 MCG capsule Take 1 capsule (24 mcg total) by mouth 2 (two) times daily with a meal. 01/01/17   Esterwood, [name] S, PA-C  Melatonin 5 MG TABS Take 5 mg by mouth at bedtime.     [provider]  Multiple Vitamins-Minerals (CERTA-VITE PO) Take 1 tablet by mouth daily.    [provider]  oxybutynin (DITROPAN) 5 MG tablet Take 5 mg by mouth 4 (four) times daily.  02/20/16   [provider]  oxyCODONE (ROXICODONE) 5 MG immediate release tablet Take one tablet by mouth every 4 hours as needed for pain 07/10/16   Kimber RelicGreen, Arthur G, MD  polyethylene glycol powder (GLYCOLAX/MIRALAX) powder Take 1  1/2 dose  daily in 12 ounces of fluid every day. Patient taking differently: Take 17 g by mouth daily. Mix with 4-8 oz  of liquid daily 01/01/17   Esterwood, [name] S, PA-C  QUEtiapine (SEROQUEL) 100 MG tablet Take 1 tablet (100 mg total) by mouth at bedtime. 06/21/15   Tyrone Nine, MD  tiZANidine (ZANAFLEX) 4 MG capsule Take 4 mg by mouth 2 (two) times daily.     [provider]  tretinoin (RETIN-A) 0.05 %  cream Apply 1 application topically at bedtime. face    [provider]  warfarin (COUMADIN) 6 MG tablet Take 6 mg by mouth daily at 6 PM.    [provider]    Family History Family History  Problem Relation Age of Onset  . Asthma Father   . Colon cancer Maternal Grandmother        Died in her 64's  . Breast cancer Paternal Grandmother        Died in her 28's  . Heart attack Maternal Grandfather        Died in his 12's  . Alzheimer's disease Paternal Grandfather        Died in his 59's  . Breast cancer Mother   . Stomach cancer Neg Hx     Social History Social History  Substance Use Topics  . Smoking status: Never Smoker  . Smokeless tobacco: Never Used  . Alcohol use 0.6 oz/week    1 Glasses of wine per week     Comment: Drinks alcohol once a month.     Allergies   Morphine   Review of Systems Review of Systems  Constitutional: Negative for chills and fever.  Respiratory: Negative for cough and shortness of breath.   Cardiovascular: Negative for chest pain.  Gastrointestinal: Positive for abdominal distention and abdominal pain. Negative for nausea and vomiting.  Genitourinary: Positive for difficulty urinating. Negative for hematuria.  Neurological: Negative for headaches.  All other systems reviewed and are negative.    Physical Exam Updated Vital Signs BP 99/75 (BP Location: Left Arm)   Pulse 67   Temp 98.9 F (37.2 C) (Oral)   Resp 20   LMP 04/05/2011   SpO2 93%   Physical Exam  Constitutional: She appears well-developed and well-nourished. No distress.  HENT:  Head: Normocephalic.  Eyes: Conjunctivae are normal.  Neck: Neck supple.  Cardiovascular: Normal rate, regular rhythm and normal heart sounds.   Pulmonary/Chest: Effort normal and breath sounds normal. No respiratory distress. She has no wheezes. She has no rales.  Abdominal: Soft. Bowel sounds are normal. She exhibits no distension. There is tenderness. There is no  rebound.  suprapubic cath in place, appears normal. Suprapubic tenderness  Musculoskeletal:  Contracted extremities due to CP  Neurological: She is alert.  Skin: Skin is warm and dry.  Psychiatric: She has a normal mood and affect. Her behavior is normal.  Nursing note and vitals reviewed.    ED Treatments / Results  Labs (all labs ordered are listed, but only abnormal results are displayed) Labs Reviewed - No data to display  EKG  EKG Interpretation None       Radiology No results found.  Procedures SUPRAPUBIC TUBE PLACEMENT Date/Time: 09/27/2017 2:18 PM Performed by: Jaynie Crumble Authorized by: Jaynie Crumble   Consent:    Consent obtained:  Verbal   Consent given by:  Patient   Risks discussed:  Bleeding, bowel perforation and infection   Alternatives discussed:  No treatment Procedure details:    Complexity:  Simple   Catheter type:  Foley   Catheter size:  22 Fr   Ultrasound guidance: no  Number of attempts:  1   Urine characteristics:  Mildly cloudy Post-procedure details:    Patient tolerance of procedure:  Tolerated well, no immediate complications   (including critical care time)  Medications Ordered in ED Medications - No data to display   Initial Impression / Assessment and Plan / ED Course  I have reviewed the triage vital signs and the nursing notes.  Pertinent labs & imaging results that were available during my care of the patient were reviewed by me and considered in my medical decision making (see chart for details).     Patient with clogged Foley catheter.  No fever or chills.  No nausea or vomiting.  No flank pain.  Some suprapubic irritation and spasms.  Patient is on chronic bladder antispasmodics due to indwelling suprapubic catheter.  Will try to irrigate Foley   2:19 PM Unable to irrigate the Foley.  Catheter replaced with immediate flow of the urine into the bag.  Urine is cloudy.  All prior UAs with too numerous  to count bacteria, most likely colonized at this point.  Given no fever or abdominal pain or infectious symptoms I will hold off on getting urinalysis or treating.  Her prior cultures almost always showed multi species growth.  Discussed with patient following up if she has any continued urinary symptoms.  Return precautions discussed.   Vitals:   09/27/17 1148 09/27/17 1400  BP: 99/75 97/68  Pulse: 67 (!) 58  Resp: 20 18  Temp: 98.9 F (37.2 C)   TempSrc: Oral   SpO2: 93% 94%     Final Clinical Impressions(s) / ED Diagnoses   Final diagnoses:  Urinary retention  Suprapubic catheter dysfunction, initial encounter [facility])    New Prescriptions New Prescriptions   No medications on file     Jaynie Crumble, PA-C 09/27/17 1422    Lorre Nick, MD 09/28/17 [phone]

## 2017-09-27 NOTE — ED Notes (Signed)
[facility] dispatch called for patient transport to facility.

## 2017-09-30 DIAGNOSIS — G808 Other cerebral palsy: Secondary | ICD-10-CM | POA: Diagnosis not present

## 2017-09-30 DIAGNOSIS — N318 Other neuromuscular dysfunction of bladder: Secondary | ICD-10-CM | POA: Diagnosis not present

## 2017-09-30 DIAGNOSIS — M542 Cervicalgia: Secondary | ICD-10-CM | POA: Diagnosis not present

## 2017-10-07 DIAGNOSIS — G8929 Other chronic pain: Secondary | ICD-10-CM | POA: Diagnosis not present

## 2017-10-07 DIAGNOSIS — M6248 Contracture of muscle, other site: Secondary | ICD-10-CM | POA: Diagnosis not present

## 2017-10-08 DIAGNOSIS — G8929 Other chronic pain: Secondary | ICD-10-CM | POA: Diagnosis not present

## 2017-10-15 DIAGNOSIS — G8929 Other chronic pain: Secondary | ICD-10-CM | POA: Diagnosis not present

## 2017-10-26 ENCOUNTER — Emergency Department (HOSPITAL_COMMUNITY)
Admission: EM | Admit: 2017-10-26 | Discharge: 2017-10-26 | Disposition: A | Discharge status: Home / Self Care | Payer: Medicare Other | Attending: Emergency Medicine | Admitting: Emergency Medicine

## 2017-10-26 ENCOUNTER — Encounter (HOSPITAL_COMMUNITY): Payer: Self-pay | Admitting: Emergency Medicine

## 2017-10-26 DIAGNOSIS — R338 Other retention of urine: Secondary | ICD-10-CM | POA: Diagnosis not present

## 2017-10-26 DIAGNOSIS — T83098A Other mechanical complication of other indwelling urethral catheter, initial encounter: Secondary | ICD-10-CM | POA: Diagnosis not present

## 2017-10-26 DIAGNOSIS — R339 Retention of urine, unspecified: Secondary | ICD-10-CM

## 2017-10-26 DIAGNOSIS — T83090A Other mechanical complication of cystostomy catheter, initial encounter: Secondary | ICD-10-CM | POA: Insufficient documentation

## 2017-10-26 DIAGNOSIS — R100 Acute abdomen: Secondary | ICD-10-CM | POA: Diagnosis not present

## 2017-10-26 DIAGNOSIS — T83091A Other mechanical complication of indwelling urethral catheter, initial encounter: Secondary | ICD-10-CM

## 2017-10-26 DIAGNOSIS — Y828 Other medical devices associated with adverse incidents: Secondary | ICD-10-CM | POA: Diagnosis not present

## 2017-10-26 DIAGNOSIS — T83498A Other mechanical complication of other prosthetic devices, implants and grafts of genital tract, initial encounter: Secondary | ICD-10-CM | POA: Diagnosis not present

## 2017-10-26 DIAGNOSIS — T83198A Other mechanical complication of other urinary devices and implants, initial encounter: Secondary | ICD-10-CM | POA: Diagnosis not present

## 2017-10-26 LAB — BASIC METABOLIC PANEL
ANION GAP: 12 (ref 5–15)
BUN: 13 mg/dL (ref 6–20)
CALCIUM: 9.1 mg/dL (ref 8.9–10.3)
CHLORIDE: 104 mmol/L (ref 101–111)
CO2: 21 mmol/L — AB (ref 22–32)
Creatinine, Ser: 0.58 mg/dL (ref 0.44–1.00)
GFR calc non Af Amer: >60 mL/min (ref >60)
GLUCOSE: 117 mg/dL — AB (ref 65–99)
Potassium: 4.1 mmol/L (ref 3.5–5.1)
Sodium: 137 mmol/L (ref 135–145)

## 2017-10-26 LAB — URINALYSIS, ROUTINE W REFLEX MICROSCOPIC
Bilirubin Urine: NEGATIVE
Glucose, UA: NEGATIVE mg/dL
Ketones, ur: NEGATIVE mg/dL
NITRITE: POSITIVE — AB
PH: 7 (ref 5.0–8.0)
Protein, ur: 100 mg/dL — AB
SPECIFIC GRAVITY, URINE: 1.017 (ref 1.005–1.030)

## 2017-10-26 LAB — CBC WITH DIFFERENTIAL/PLATELET
Basophils Absolute: 0 10*3/uL (ref 0.0–0.1)
Basophils Relative: 0 %
Eosinophils Absolute: 0.3 10*3/uL (ref 0.0–0.7)
Eosinophils Relative: 3 %
HEMATOCRIT: 41.3 % (ref 36.0–46.0)
Hemoglobin: 13.5 g/dL (ref 12.0–15.0)
LYMPHS PCT: 12 %
Lymphs Abs: 1.3 10*3/uL (ref 0.7–4.0)
MCH: 30.4 pg (ref 26.0–34.0)
MCHC: 32.7 g/dL (ref 30.0–36.0)
MCV: 93 fL (ref 78.0–100.0)
MONO ABS: 0.6 10*3/uL (ref 0.1–1.0)
MONOS PCT: 5 %
NEUTROS ABS: 8.8 10*3/uL — AB (ref 1.7–7.7)
NEUTROS PCT: 80 %
Platelets: 276 10*3/uL (ref 150–400)
RBC: 4.44 MIL/uL (ref 3.87–5.11)
RDW: 14.4 % (ref 11.5–15.5)
WBC: 10.9 10*3/uL — ABNORMAL HIGH (ref 4.0–10.5)

## 2017-10-26 MED ORDER — FENTANYL CITRATE (PF) 100 MCG/2ML IJ SOLN
50.0000 ug | INTRAMUSCULAR | Status: DC | PRN
  Administered 2017-10-26: 50 ug via INTRAVENOUS
  Filled 2017-10-26: qty 2

## 2017-10-26 NOTE — ED Notes (Signed)
[facility] contacted for tx back to [facility] 

## 2017-10-26 NOTE — Discharge Instructions (Signed)
Follow up closely with your urologist and primary doc Follow up culture result. See a clinician if you develop fevers, confusion ,weakness or other symptoms.

## 2017-10-26 NOTE — ED Triage Notes (Signed)
[facility] with complaints of clogged urinary catheter.Pt reports the bag has not been emptied since 5 am today and staff did not try to unclog it. Pt repots stomach pain from not being able to void.

## 2017-10-26 NOTE — ED Provider Notes (Signed)
[facility] EMERGENCY DEPARTMENT Provider Note   CSN: [id number] Arrival date & time: 10/26/17  1557     History   Chief Complaint Chief Complaint  Patient presents with  . Urinary Retention    HPI [name] [name] is a 41 y.o. female.  Patient from [place] presents with lower abdominal pain/urinary retention since this morning. Patient reports the bag has not been emptied since 5:00 this morning. Patient says staff did not try to unclog it. Patient said worsening pain since. No vomiting or fevers. No current antibiotics. Patient states she is followed by urologist in [place] has a suprapubic catheter. No weakness or fevers.        Past Medical History:  Diagnosis Date  . Abdominal pain 01/02/2012   Overview:  Overview:  Per Previous pcp note- Dr. Ta--Pt has history of endometriosis and had DUB in 2011.  She She had u/s that showed normal endometrial stripe.  She had endometrial bx that was wnl.  She has a lot of abd cramping every month.  This cramping was sometimes not relieved with hydrocodone.  She was referred to [facility] for IUD placement to help endometriosis and abd cramping.  [name]  . Abdominal pain 01/02/2012   Overview:  Overview:  Per Previous pcp note- Dr. Ta--Pt has history of endometriosis and had DUB in 2011.  She She had u/s that showed normal endometrial stripe.  She had endometrial bx that was wnl.  She has a lot of abd cramping every month.  This cramping was sometimes not relieved with hydrocodone.  She was referred to [facility] for IUD placement to help endometriosis and abd cramping.  [name] was placed 04/30/11 by [facility].  Pt still complains of abd cramping, which I am treating with hydrocodone and ultram. Pt reports some initial relief of pain from IUD, but states it has now returned back to similar to previous cramping. Gave patient a shot of Toradol 60 mg IM today-since positive exacerbation of abdominal cramping during past 2 weeks.  Physical exam reassuring. Patient may need to return to [facility] for further workup since she is plugged in with the GYN providers there for further treatment options regarding endometriosis and abdominal cramping. The dysfunctional uterine bleeding now well contr  . Acute cystitis with hematuria   . Acute cystitis without hematuria   . Acute respiratory failure with hypoxia ([name])   . Anemia   . Cellulitis 12/22/2014  . Cerebral palsy ([name])    Spastic Cerebral palsy, mentally intact  . Contracture, joint, multiple sites    Electric wheelchair, uses left hand to operate chair.   . Depression   . Disease of female genital organs 10/24/2010   Overview:  Overview:  Pt has history of endometriosis and had DUB in 2011.  She had u/s that showed normal endometrial stripe.  She had endometrial bx that was wnl.  She has a lot of abd cramping every month.  This cramping was sometimes not relieved with hydrocodone.  She was referred to [facility] for IUD placement to help endometriosis and abd cramping.  [name] was placed 04/30/11 by [facility].  Pt still complains of abd cramping, which was treated with hydrocodone and ultram.   Last seen at [facility] by OB/GYN 12/24/11, given Depo Lupron injection.  Ordering CT scan to r/o additional cause of abdominal pain (pt with known endometriosis and will not tolerate vaginal U/S, pain not improved s/p [name] or with Lupron injection-- only caused hot flashes and amenorrhea,  no pain relief).  Also made referral to pain clinic for better pain control.  Do not think hysterectomy would help with pain, and concern for contamination of baclofen pump if did proceed with surgery.    8/5/13[name]: Pt was seen by   . [name]   . Dysarthria   . Dyspnea and respiratory abnormality 02/04/2011   Overview:  Overview:  Pt has history of recurrent PE and is on chronic coumadin.  Pt has been complaining of dyspnea that she describes as chest tightness.  She  has been to the ER multiple times for this.  Usually she gets CTA and serial CE.  She has had at least 6 CTA in a period of since 2011.  She also has an O2 requirement. It is unclear why pt feels dyspneic, complains of chest tightness, or has O2 requirement.  I thought that her complaint may be from deconditioning and referred her to PT.  Unfortunately Medicaid only pays for 4 PT visits.  Pt should be doing these exercises at home, but she has not.  I have referred her to [facility], and she has been seen by [name].  He did not understand the etiology of her dyspnea and O2 requirement.  He will continue to follow her.   Last Assessment & Plan:  Pt presents with concerns for chest tightness and dyspnea.  She was seen in ED yesterday and discharged home.  While there she had a CT chest 04/20/11 showed 1.  Multifocal degradation as detailed above.  N  . Endometriosis   . GERD (gastroesophageal reflux disease)   . Gram positive sepsis ([name]) 10/24/2014  . Gross hematuria 03/12/2013   Overview:  Overview:  status post replacement of suprapubic tube 03/04/2013  Last Assessment & Plan:  Recent urine culture collected in ED on 5/4 did not result in significant growth. Moreover, patient has been asymptomatic with normal WBC, no fever and no true symptoms making symptomatic UTI less likely.  Will continue to monitor for any sign of infection.  Patient's xarelto had been held during her recent hospitalization due to hematuria. This has since been re-started. Monitor bleeding.    [name] Kitchen HCAP (healthcare-associated pneumonia) 10/25/2014  . History of anticoagulant therapy 10/21/2012  . History of endometriosis 10/2012   OBGYN [facility]: Laparoscopic Endometrial Ablation, TVH,  . History of recurrent UTIs   . Hypertension   . HYPERTENSION, BENIGN 01/31/2010   Qualifier: Diagnosis of  By: [name], MD, [name] Incontinent of feces 03/11/2016  . Infantile cerebral palsy ([name]) 01/29/2007   Overview:  She has spastic CP.  She  is mentally intact.  She is wheelchair bound.  She is wheelchair bound for ambulation      . Intracervical pessary 05/07/2011   Overview:  Overview:  Placed by [facility] 04/30/11 to treat DUB and Endometriosis.  She is seen at GYN clinic at [facility] but was considered a poor surgical candidate and referred her to [facility].  For DUB she had pelvic ultrasound that showed thin stripe and she had endometrial Bx by Dr Jennette Kettle in GYN clinic, which was negative.     . Macroscopic hematuria 03/10/2013   status post replacement of suprapubic tube 03/04/2013   . Migraine   . OCD (obsessive compulsive disorder)   . Parapneumonic effusion 10/25/2014  . Presence of intrathecal baclofen pump 03/07/2014   R LQ. Insertion T10.    [name] Kitchen Psychiatric illness 10/31/2014  . Pulmonary  embolism ([name])    Lifetime Coumadin  . Pulmonary embolism ([name]) 2011, 01/2011   Will be on lifetime coumadin  . Recurrent pulmonary embolism ([name]) 05/03/2011   Pt has history of recurrent PE and is on chronic coumadin.  Pt has been complaining of dyspnea that she describes as chest tightness.  She has been to the ER multiple times for this.  Usually she gets CTA and serial CE.  She has had at least 6 CTA in a period of since 2011.  She also has an O2 requirement. It is unclear why pt feels dyspneic, complains of chest tightness, or has O2 requirement.  I thought that her complaint may be from deconditioning and referred her to PT.  Unfortunately Medicaid only pays for 4 PT visits.  Pt should be doing these exercises at home, but she has not.  [facility], and she has been seen by Dr Marchelle Gearingamaswamy.  He did not understand the etiology of her dyspnea and O2 requirement.  He will continue to follow her.     . Seborrheic keratosis 01/18/2011  . Seizures ([name]) 02/12/2016  . Spasticity 01/05/2014  . Transient alteration of awareness, recurrent 09/08/2006   Recurrent episodes where [name]  loses contact with her world and that have been studied  thoroughly and appear nonepileptic in nature per Dr Terrace ArabiaYan (neurology) The patient has had episodes starting in 2007 that initially began 1-4 times a day during which time she would have periods of unawareness followed by confusion. This continuous video EEG monitoring performed from October 8-12, 2007. Had   . Urethra dilated and patulous 07/03/2015   Patulous, Dilated Urethra. 5mL balloon on a 22-Fr catheter hoping this would prevent the bladder spasm from pushing the balloon down the urethra (Dr Logan BoresEvans, Urology [facility], July 2016)     Patient Active Problem List   Diagnosis Date Noted  . Incontinent of feces 03/11/2016  . Chronic anticoagulation 03/11/2016  . Involuntary movements 02/22/2016  . Seizures ([name]) 02/12/2016  . Dyspnea 02/12/2016  . Cerebral palsy, quadriplegic ([name]) 02/06/2016  . Swelling of extremity 01/13/2016  . Pressure ulcer 10/09/2015  . Status post insertion of inferior vena caval filter 09/28/2015  . Absence of bladder continence 08/25/2015  . Person living in residential institution 06/14/2015  . Pulmonary hypertension ([name])   . Anemia of chronic disease   . Spasticity 03/15/2015  . Constipation, chronic 10/10/2014  . Athetoid cerebral palsy ([name]) 04/13/2014  . Dystonia 04/12/2014  . Presence of intrathecal baclofen pump 03/07/2014  . Dental caries 07/18/2013  . Multiple thyroid nodules 05/20/2013  . Osteoarthrosis 04/07/2013  . [name]   . Dysarthria   . GERD (gastroesophageal reflux disease)   . Neurogenic bladder 02/05/2013  . Anxiety state 10/21/2012  . Chronic pain syndrome 06/17/2012  . Recurrent pulmonary embolism ([name]) 05/03/2011  . Pulmonary embolism with infarction ([name]) 05/03/2011  . Chronic suprapubic catheter ([name]) 03/15/2011  . Disease of female genital organs 10/24/2010  . HYPERTENSION, BENIGN 01/31/2010  . CP (cerebral palsy), spastic ([name]) 12/27/2008  . Contracted, joint, multiple sites 12/27/2008  . Major  depressive disorder, recurrent episode ([name]) 01/29/2007  . Obsessive Compulsive Disorder 01/29/2007  . Transient alteration of awareness, recurrent 09/08/2006    Past Surgical History:  Procedure Laterality Date  . ABDOMINAL HYSTERECTOMY    . APPENDECTOMY    . BACLOFEN PUMP REFILL     x 3 times  . CARPAL TUNNEL RELEASE  08/2008   Dr  Sypher  . CESAREAN SECTION     x 2  . CHOLECYSTECTOMY  10/14/2015   Procedure: LAPAROSCOPIC CHOLECYSTECTOMY;  Surgeon: Violeta GelinasBurke Thompson, MD;  [facility];  Service: General;;  . COLPOSCOPY  06/2000  . INTRAUTERINE DEVICE INSERTION  04/30/11   [facility]  . laparoscopically assisted Vag Hysterectomy  10/27/2012  . MULTIPLE EXTRACTIONS WITH ALVEOLOPLASTY Bilateral 07/19/2013   Procedure: EXTRACTIONS #4, 6,21,30,865,11,16,19;  Surgeon: Francene Findershristopher L Rockford, DDS;  [facility];  Service: Oral Surgery;  Laterality: Bilateral;  . NEUROMA SURGERY Left    anterior and posterior intersosseous  . PAIN PUMP IMPLANTATION N/A 03/07/2014   Procedure: baclofen pump revision/replacement and Catheter connection replacement;  Surgeon: Cristi LoronJeffrey D Jenkins, MD;  Location: [facility] NEURO ORS;  Service: Neurosurgery;  Laterality: N/A;  baclofen pump revision/replacement and Catheter connection replacement  . PROGRAMABLE BACLOFEN PUMP REVISION  03/17/14   Battery Replacement   . TUBAL LIGATION  2003  . URETHRA SURGERY    . WRIST SURGERY  06/2010   Dr Dierdre SearlesLi[facility]    OB History    No data available       Home Medications    Prior to Admission medications   Medication Sig Start Date End Date Taking? Authorizing Provider  acetaminophen (TYLENOL) 500 MG tablet Take 500-650 mg by mouth See admin instructions. 500mg  three times daily and 650 three times daily as needed for pain (do not exceed 4000mg  in 24hrs)   Yes [provider]  amantadine (SYMMETREL) 100 MG capsule Take 100 mg by mouth daily.    Yes [provider]    antiseptic oral rinse (BIOTENE) LIQD 15 mLs by Mouth Rinse route as needed for dry mouth.   Yes [provider]  baclofen (LIORESAL) 20 MG tablet Take 20 mg by mouth 4 (four) times daily. For bladder spasms   Yes [provider]  bisacodyl (DULCOLAX) 10 MG suppository Place 10 mg rectally as needed for moderate constipation.   Yes [provider]  clonazePAM (KLONOPIN) 0.5 MG tablet Take 0.75 mg by mouth 3 (three) times daily.    Yes [provider]  fentaNYL (DURAGESIC - DOSED MCG/HR) 12 MCG/HR Place 12.5 mcg onto the skin every 3 (three) days.   Yes [provider]  FLUoxetine HCl 60 MG TABS Take 60 mg by mouth daily.    Yes [provider]  lactose free nutrition (BOOST) LIQD Take 237 mLs by mouth 2 (two) times daily between meals.   Yes [provider]  lamoTRIgine (LAMICTAL) 100 MG tablet Take 100 mg by mouth at bedtime.   Yes [provider]  lubiprostone (AMITIZA) 24 MCG capsule Take 1 capsule (24 mcg total) by mouth 2 (two) times daily with a meal. 01/01/17  Yes Esterwood, [name] S, PA-C  Melatonin 5 MG TABS Take 5 mg by mouth at bedtime.    Yes [provider]  Multiple Vitamins-Minerals (CERTA-VITE PO) Take 1 tablet by mouth daily.   Yes [provider]  oxybutynin (DITROPAN) 5 MG tablet Take 5 mg by mouth 4 (four) times daily.  02/20/16  Yes [provider]  oxyCODONE (ROXICODONE) 5 MG immediate release tablet Take one tablet by mouth every 4 hours as needed for pain Patient taking differently: Take 5-10 mg by mouth 3 (three) times daily as needed. 5mg  three times daily as needed for pain 1-5 out of 10 10mg  three times daily as needed for pain 6-10 out of  10 07/10/16  Yes Kimber Relic, MD  polyethylene glycol powder (GLYCOLAX/MIRALAX) powder Take 1  1/2 dose  daily in 12 ounces of fluid every day. Patient taking differently: Take 17 g by mouth daily. Mix with 4-8 oz of liquid daily 01/01/17  Yes  Esterwood, [name] S, PA-C  tiZANidine (ZANAFLEX) 4 MG capsule Take 4 mg by mouth 2 (two) times daily.    Yes [provider]  warfarin (COUMADIN) 2 MG tablet Take 4.5 mg by mouth daily at 6 PM.    Yes [provider]  QUEtiapine (SEROQUEL) 100 MG tablet Take 1 tablet (100 mg total) by mouth at bedtime. Patient taking differently: Take 125 mg by mouth at bedtime.  06/21/15   Tyrone Nine, MD    Family History Family History  Problem Relation Age of Onset  . Asthma Father   . Colon cancer Maternal Grandmother        Died in her 21's  . Breast cancer Paternal Grandmother        Died in her 69's  . Heart attack Maternal Grandfather        Died in his 35's  . Alzheimer's disease Paternal Grandfather        Died in his 9's  . Breast cancer Mother   . Stomach cancer Neg Hx     Social History Social History   Tobacco Use  . Smoking status: Never Smoker  . Smokeless tobacco: Never Used  Substance Use Topics  . Alcohol use: Yes    Alcohol/week: 0.6 oz    Types: 1 Glasses of wine per week    Comment: Drinks alcohol once a month.  . Drug use: No     Allergies   Morphine   Review of Systems Review of Systems  Constitutional: Negative for chills and fever.  HENT: Negative for congestion.   Eyes: Negative for visual disturbance.  Respiratory: Negative for shortness of breath.   Cardiovascular: Negative for chest pain.  Gastrointestinal: Positive for abdominal pain. Negative for vomiting.  Genitourinary: Positive for decreased urine volume and difficulty urinating. Negative for dysuria and flank pain.  Musculoskeletal: Negative for back pain, neck pain and neck stiffness.  Skin: Negative for rash.  Neurological: Negative for light-headedness and headaches.     Physical Exam Updated Vital Signs LMP 04/05/2011   SpO2 96%   Physical Exam  Constitutional: She is oriented to person, place, and time. She appears well-developed and well-nourished.  HENT:    Head: Normocephalic and atraumatic.  Eyes: Conjunctivae are normal. Right eye exhibits no discharge. Left eye exhibits no discharge.  Neck: Normal range of motion. Neck supple. No tracheal deviation present.  Cardiovascular: Normal rate and regular rhythm.  Pulmonary/Chest: Effort normal and breath sounds normal.  Abdominal: Soft. She exhibits no distension. There is tenderness (suprapubic). There is no guarding.  Musculoskeletal: She exhibits no edema.  Neurological: She is alert and oriented to person, place, and time.  Skin: Skin is warm. No rash noted.  Psychiatric: She has a normal mood and affect.  Nursing note and vitals reviewed.    ED Treatments / Results  Labs (all labs ordered are listed, but only abnormal results are displayed) Labs Reviewed  CBC WITH DIFFERENTIAL/PLATELET - Abnormal; Notable for the following components:      Result Value   WBC 10.9 (*)    Neutro Abs 8.8 (*)    All other components within normal limits  URINE CULTURE  URINALYSIS, ROUTINE W REFLEX MICROSCOPIC  BASIC METABOLIC PANEL    EKG  EKG Interpretation None       Radiology No results found.  Procedures SUPRAPUBIC TUBE PLACEMENT Date/Time: 10/26/2017 6:06 PM Performed by: Blane Ohara, MD Authorized by: Blane Ohara, MD   Consent:    Consent obtained:  Verbal   Consent given by:  Patient   Risks discussed:  Infection and pain   Alternatives discussed:  No treatment Procedure details:    Complexity:  Simple   Catheter type:  Foley   Catheter size:  20 Fr   Ultrasound guidance: no     Number of attempts:  1   Urine characteristics:  Cloudy Post-procedure details:    Patient tolerance of procedure:  Tolerated well, no immediate complications   (including critical care time)  EMERGENCY DEPARTMENT ULTRASOUND  Study: Limited Ultrasound of Bladder  INDICATIONS: to assess for urinary retention and/or bladder volume prior to urinary catheter Multiple views of the bladder  were obtained in real-time in the transverse and longitudinal planes with a multi-frequency probe.  PERFORMED BY: Myself IMAGES ARCHIVED?: Yes LIMITATIONS:  pain INTERPRETATION: Moderate Volume       Medications Ordered in ED Medications  fentaNYL (SUBLIMAZE) injection 50 mcg (not administered)     Initial Impression / Assessment and Plan / ED Course  I have reviewed the triage vital signs and the nursing notes.  Pertinent labs & imaging results that were available during my care of the patient were reviewed by me and considered in my medical decision making (see chart for details).    Patient's symptoms resolved on recheck. Foley catheter was exchanged suprapubic. Patient urinalysis consistent with infection however with chronic colonization difficult to tell. Discussed with pharmacy and we both agreed to hold on antibiotics and have her follow closely with strict return precautions and follow-up culture to avoid creating resistance.   Results and differential diagnosis were discussed with the patient/parent/guardian. Xrays were independently reviewed by myself.  Close follow up outpatient was discussed, comfortable with the plan.   Medications  fentaNYL (SUBLIMAZE) injection 50 mcg (50 mcg Intravenous Given 10/26/17 1814)    Vitals:   10/26/17 1609 10/26/17 1815 10/26/17 1912  BP:  (!) 150/67 103/66  Resp:  (!) 27   SpO2: 96%  98%    Final diagnoses:  Urinary retention  Obstruction of Foley catheter, initial encounter [facility])      Final Clinical Impressions(s) / ED Diagnoses   Final diagnoses:  None    ED Discharge Orders    None       Blane Ohara, MD 10/26/17 1932

## 2017-10-27 DIAGNOSIS — R338 Other retention of urine: Secondary | ICD-10-CM | POA: Diagnosis not present

## 2017-10-27 DIAGNOSIS — N318 Other neuromuscular dysfunction of bladder: Secondary | ICD-10-CM | POA: Diagnosis not present

## 2017-10-27 DIAGNOSIS — Z23 Encounter for immunization: Secondary | ICD-10-CM | POA: Diagnosis not present

## 2017-10-27 LAB — URINE CULTURE

## 2017-10-29 ENCOUNTER — Telehealth: Payer: Self-pay | Admitting: Physical Medicine & Rehabilitation

## 2017-10-29 DIAGNOSIS — K219 Gastro-esophageal reflux disease without esophagitis: Secondary | ICD-10-CM | POA: Diagnosis not present

## 2017-10-29 DIAGNOSIS — G8929 Other chronic pain: Secondary | ICD-10-CM | POA: Diagnosis not present

## 2017-10-29 DIAGNOSIS — I2699 Other pulmonary embolism without acute cor pulmonale: Secondary | ICD-10-CM | POA: Diagnosis not present

## 2017-10-29 DIAGNOSIS — N318 Other neuromuscular dysfunction of bladder: Secondary | ICD-10-CM | POA: Diagnosis not present

## 2017-10-29 NOTE — Telephone Encounter (Signed)
Pt phoned to schedule repeat Botox. She stated that in her last appointment it was discussed using other drugs for injections. Please advise.

## 2017-10-30 NOTE — Telephone Encounter (Signed)
May schedule for Dysport 1000U  [id number]

## 2017-11-07 ENCOUNTER — Encounter: Payer: Medicare Other | Attending: Physical Medicine & Rehabilitation

## 2017-11-07 ENCOUNTER — Ambulatory Visit (HOSPITAL_BASED_OUTPATIENT_CLINIC_OR_DEPARTMENT_OTHER): Payer: Medicare Other | Admitting: Physical Medicine & Rehabilitation

## 2017-11-07 ENCOUNTER — Encounter: Payer: Self-pay | Admitting: Physical Medicine & Rehabilitation

## 2017-11-07 VITALS — BP 103/69 | HR 96 | Resp 14

## 2017-11-07 DIAGNOSIS — G800 Spastic quadriplegic cerebral palsy: Secondary | ICD-10-CM

## 2017-11-07 DIAGNOSIS — R252 Cramp and spasm: Secondary | ICD-10-CM | POA: Insufficient documentation

## 2017-11-07 NOTE — Progress Notes (Signed)
Dysport Injection for spasticity using needle EMG guidance  Dilution: 200 Units/ml Indication: Severe spasticity which interferes with ADL,mobility and/or  hygiene and is unresponsive to medication management and other conservative care Informed consent was obtained after describing risks and benefits of the procedure with the patient. This includes bleeding, bruising, infection, excessive weakness, or medication side effects. A REMS form is on file and signed. Needle:  needle electrode Number of units per muscle Biceps200 FCR200 FCU200 FDP100 FDS 100 Pronator teres 100 Pronator quad 100 All injections were done after obtaining appropriate EMG activity and after negative drawback for blood. The patient tolerated the procedure well. Post procedure instructions were given. A followup appointment was made.

## 2017-11-07 NOTE — Patient Instructions (Signed)

## 2017-11-11 DIAGNOSIS — R569 Unspecified convulsions: Secondary | ICD-10-CM | POA: Diagnosis not present

## 2017-11-11 DIAGNOSIS — F329 Major depressive disorder, single episode, unspecified: Secondary | ICD-10-CM | POA: Diagnosis not present

## 2017-11-11 DIAGNOSIS — G8918 Other acute postprocedural pain: Secondary | ICD-10-CM | POA: Diagnosis not present

## 2017-11-11 DIAGNOSIS — N3941 Urge incontinence: Secondary | ICD-10-CM | POA: Diagnosis not present

## 2017-11-11 DIAGNOSIS — M2450 Contracture, unspecified joint: Secondary | ICD-10-CM | POA: Diagnosis not present

## 2017-11-11 DIAGNOSIS — M4143 Neuromuscular scoliosis, cervicothoracic region: Secondary | ICD-10-CM | POA: Diagnosis not present

## 2017-11-11 DIAGNOSIS — I10 Essential (primary) hypertension: Secondary | ICD-10-CM | POA: Diagnosis not present

## 2017-11-11 DIAGNOSIS — Z86711 Personal history of pulmonary embolism: Secondary | ICD-10-CM | POA: Diagnosis not present

## 2017-11-11 DIAGNOSIS — Z86718 Personal history of other venous thrombosis and embolism: Secondary | ICD-10-CM | POA: Diagnosis not present

## 2017-11-11 DIAGNOSIS — F429 Obsessive-compulsive disorder, unspecified: Secondary | ICD-10-CM | POA: Diagnosis not present

## 2017-11-11 DIAGNOSIS — Z7901 Long term (current) use of anticoagulants: Secondary | ICD-10-CM | POA: Diagnosis not present

## 2017-11-11 DIAGNOSIS — D62 Acute posthemorrhagic anemia: Secondary | ICD-10-CM | POA: Diagnosis not present

## 2017-11-11 DIAGNOSIS — G4700 Insomnia, unspecified: Secondary | ICD-10-CM | POA: Diagnosis not present

## 2017-11-11 DIAGNOSIS — I4581 Long QT syndrome: Secondary | ICD-10-CM | POA: Diagnosis not present

## 2017-11-11 DIAGNOSIS — G800 Spastic quadriplegic cerebral palsy: Secondary | ICD-10-CM | POA: Diagnosis not present

## 2017-11-11 DIAGNOSIS — G894 Chronic pain syndrome: Secondary | ICD-10-CM | POA: Diagnosis not present

## 2017-11-11 DIAGNOSIS — K219 Gastro-esophageal reflux disease without esophagitis: Secondary | ICD-10-CM | POA: Diagnosis not present

## 2017-11-11 DIAGNOSIS — G243 Spasmodic torticollis: Secondary | ICD-10-CM | POA: Diagnosis not present

## 2017-11-11 DIAGNOSIS — N312 Flaccid neuropathic bladder, not elsewhere classified: Secondary | ICD-10-CM | POA: Diagnosis not present

## 2017-11-12 DIAGNOSIS — I484 Atypical atrial flutter: Secondary | ICD-10-CM | POA: Diagnosis not present

## 2017-11-13 DIAGNOSIS — F331 Major depressive disorder, recurrent, moderate: Secondary | ICD-10-CM | POA: Diagnosis not present

## 2017-11-14 DIAGNOSIS — N318 Other neuromuscular dysfunction of bladder: Secondary | ICD-10-CM | POA: Diagnosis not present

## 2017-11-14 DIAGNOSIS — N390 Urinary tract infection, site not specified: Secondary | ICD-10-CM | POA: Diagnosis not present

## 2017-11-14 DIAGNOSIS — N319 Neuromuscular dysfunction of bladder, unspecified: Secondary | ICD-10-CM | POA: Diagnosis not present

## 2017-11-14 DIAGNOSIS — R5081 Fever presenting with conditions classified elsewhere: Secondary | ICD-10-CM | POA: Diagnosis not present

## 2017-11-17 DIAGNOSIS — R9431 Abnormal electrocardiogram [ECG] [EKG]: Secondary | ICD-10-CM | POA: Diagnosis not present

## 2017-11-17 DIAGNOSIS — Z960 Presence of urogenital implants: Secondary | ICD-10-CM | POA: Diagnosis not present

## 2017-11-17 DIAGNOSIS — G8918 Other acute postprocedural pain: Secondary | ICD-10-CM | POA: Diagnosis not present

## 2017-11-17 DIAGNOSIS — G809 Cerebral palsy, unspecified: Secondary | ICD-10-CM | POA: Diagnosis not present

## 2017-11-17 DIAGNOSIS — M4143 Neuromuscular scoliosis, cervicothoracic region: Secondary | ICD-10-CM | POA: Diagnosis not present

## 2017-11-17 DIAGNOSIS — N809 Endometriosis, unspecified: Secondary | ICD-10-CM | POA: Diagnosis not present

## 2017-11-17 DIAGNOSIS — D62 Acute posthemorrhagic anemia: Secondary | ICD-10-CM | POA: Diagnosis not present

## 2017-11-17 DIAGNOSIS — R482 Apraxia: Secondary | ICD-10-CM | POA: Diagnosis not present

## 2017-11-17 DIAGNOSIS — R609 Edema, unspecified: Secondary | ICD-10-CM | POA: Diagnosis not present

## 2017-11-17 DIAGNOSIS — D649 Anemia, unspecified: Secondary | ICD-10-CM | POA: Diagnosis not present

## 2017-11-17 DIAGNOSIS — Z86711 Personal history of pulmonary embolism: Secondary | ICD-10-CM | POA: Diagnosis not present

## 2017-11-17 DIAGNOSIS — F329 Major depressive disorder, single episode, unspecified: Secondary | ICD-10-CM | POA: Diagnosis not present

## 2017-11-17 DIAGNOSIS — I4581 Long QT syndrome: Secondary | ICD-10-CM | POA: Diagnosis not present

## 2017-11-17 DIAGNOSIS — R000 Tachycardia, unspecified: Secondary | ICD-10-CM | POA: Diagnosis not present

## 2017-11-17 DIAGNOSIS — Z978 Presence of other specified devices: Secondary | ICD-10-CM | POA: Diagnosis not present

## 2017-11-17 DIAGNOSIS — M6281 Muscle weakness (generalized): Secondary | ICD-10-CM | POA: Diagnosis not present

## 2017-11-17 DIAGNOSIS — Z981 Arthrodesis status: Secondary | ICD-10-CM | POA: Diagnosis not present

## 2017-11-17 DIAGNOSIS — G8929 Other chronic pain: Secondary | ICD-10-CM | POA: Diagnosis not present

## 2017-11-17 DIAGNOSIS — M4122 Other idiopathic scoliosis, cervical region: Secondary | ICD-10-CM | POA: Diagnosis not present

## 2017-11-17 DIAGNOSIS — G4700 Insomnia, unspecified: Secondary | ICD-10-CM | POA: Diagnosis not present

## 2017-11-17 DIAGNOSIS — R1311 Dysphagia, oral phase: Secondary | ICD-10-CM | POA: Diagnosis not present

## 2017-11-17 DIAGNOSIS — M24549 Contracture, unspecified hand: Secondary | ICD-10-CM | POA: Diagnosis not present

## 2017-11-17 DIAGNOSIS — R10819 Abdominal tenderness, unspecified site: Secondary | ICD-10-CM | POA: Diagnosis not present

## 2017-11-17 DIAGNOSIS — I2699 Other pulmonary embolism without acute cor pulmonale: Secondary | ICD-10-CM | POA: Diagnosis not present

## 2017-11-17 DIAGNOSIS — N9489 Other specified conditions associated with female genital organs and menstrual cycle: Secondary | ICD-10-CM | POA: Diagnosis not present

## 2017-11-17 DIAGNOSIS — Z7901 Long term (current) use of anticoagulants: Secondary | ICD-10-CM | POA: Diagnosis not present

## 2017-11-17 DIAGNOSIS — I10 Essential (primary) hypertension: Secondary | ICD-10-CM | POA: Diagnosis not present

## 2017-11-17 DIAGNOSIS — M4124 Other idiopathic scoliosis, thoracic region: Secondary | ICD-10-CM | POA: Diagnosis not present

## 2017-11-17 DIAGNOSIS — R278 Other lack of coordination: Secondary | ICD-10-CM | POA: Diagnosis not present

## 2017-11-17 DIAGNOSIS — I2782 Chronic pulmonary embolism: Secondary | ICD-10-CM | POA: Diagnosis not present

## 2017-11-17 DIAGNOSIS — N312 Flaccid neuropathic bladder, not elsewhere classified: Secondary | ICD-10-CM | POA: Diagnosis present

## 2017-11-17 DIAGNOSIS — Z9689 Presence of other specified functional implants: Secondary | ICD-10-CM | POA: Diagnosis not present

## 2017-11-17 DIAGNOSIS — M40202 Unspecified kyphosis, cervical region: Secondary | ICD-10-CM | POA: Diagnosis not present

## 2017-11-17 DIAGNOSIS — G894 Chronic pain syndrome: Secondary | ICD-10-CM | POA: Diagnosis present

## 2017-11-17 DIAGNOSIS — R2681 Unsteadiness on feet: Secondary | ICD-10-CM | POA: Diagnosis not present

## 2017-11-17 DIAGNOSIS — R339 Retention of urine, unspecified: Secondary | ICD-10-CM | POA: Diagnosis not present

## 2017-11-17 DIAGNOSIS — G43909 Migraine, unspecified, not intractable, without status migrainosus: Secondary | ICD-10-CM | POA: Diagnosis not present

## 2017-11-17 DIAGNOSIS — Z4789 Encounter for other orthopedic aftercare: Secondary | ICD-10-CM | POA: Diagnosis not present

## 2017-11-17 DIAGNOSIS — M4182 Other forms of scoliosis, cervical region: Secondary | ICD-10-CM | POA: Diagnosis not present

## 2017-11-17 DIAGNOSIS — F429 Obsessive-compulsive disorder, unspecified: Secondary | ICD-10-CM | POA: Diagnosis present

## 2017-11-17 DIAGNOSIS — G243 Spasmodic torticollis: Secondary | ICD-10-CM | POA: Diagnosis present

## 2017-11-17 DIAGNOSIS — M542 Cervicalgia: Secondary | ICD-10-CM | POA: Diagnosis not present

## 2017-11-17 DIAGNOSIS — M24531 Contracture, right wrist: Secondary | ICD-10-CM | POA: Diagnosis not present

## 2017-11-17 DIAGNOSIS — M24561 Contracture, right knee: Secondary | ICD-10-CM | POA: Diagnosis not present

## 2017-11-17 DIAGNOSIS — R1011 Right upper quadrant pain: Secondary | ICD-10-CM | POA: Diagnosis not present

## 2017-11-17 DIAGNOSIS — N3941 Urge incontinence: Secondary | ICD-10-CM | POA: Diagnosis present

## 2017-11-17 DIAGNOSIS — R52 Pain, unspecified: Secondary | ICD-10-CM | POA: Diagnosis not present

## 2017-11-17 DIAGNOSIS — M24562 Contracture, left knee: Secondary | ICD-10-CM | POA: Diagnosis not present

## 2017-11-17 DIAGNOSIS — Z9889 Other specified postprocedural states: Secondary | ICD-10-CM | POA: Diagnosis not present

## 2017-11-17 DIAGNOSIS — Z419 Encounter for procedure for purposes other than remedying health state, unspecified: Secondary | ICD-10-CM | POA: Diagnosis not present

## 2017-11-17 DIAGNOSIS — G800 Spastic quadriplegic cerebral palsy: Secondary | ICD-10-CM | POA: Diagnosis not present

## 2017-11-17 DIAGNOSIS — M2450 Contracture, unspecified joint: Secondary | ICD-10-CM | POA: Diagnosis present

## 2017-11-17 DIAGNOSIS — N319 Neuromuscular dysfunction of bladder, unspecified: Secondary | ICD-10-CM | POA: Diagnosis not present

## 2017-11-17 DIAGNOSIS — K219 Gastro-esophageal reflux disease without esophagitis: Secondary | ICD-10-CM | POA: Diagnosis not present

## 2017-11-17 DIAGNOSIS — Z86718 Personal history of other venous thrombosis and embolism: Secondary | ICD-10-CM | POA: Diagnosis not present

## 2017-11-17 DIAGNOSIS — R569 Unspecified convulsions: Secondary | ICD-10-CM | POA: Diagnosis present

## 2017-11-22 DIAGNOSIS — G809 Cerebral palsy, unspecified: Secondary | ICD-10-CM | POA: Diagnosis not present

## 2017-11-22 DIAGNOSIS — F329 Major depressive disorder, single episode, unspecified: Secondary | ICD-10-CM | POA: Diagnosis not present

## 2017-11-22 DIAGNOSIS — I10 Essential (primary) hypertension: Secondary | ICD-10-CM | POA: Diagnosis present

## 2017-11-22 DIAGNOSIS — K089 Disorder of teeth and supporting structures, unspecified: Secondary | ICD-10-CM | POA: Diagnosis not present

## 2017-11-22 DIAGNOSIS — R1011 Right upper quadrant pain: Secondary | ICD-10-CM | POA: Diagnosis not present

## 2017-11-22 DIAGNOSIS — R7989 Other specified abnormal findings of blood chemistry: Secondary | ICD-10-CM | POA: Diagnosis not present

## 2017-11-22 DIAGNOSIS — I2699 Other pulmonary embolism without acute cor pulmonale: Secondary | ICD-10-CM | POA: Diagnosis not present

## 2017-11-22 DIAGNOSIS — J189 Pneumonia, unspecified organism: Secondary | ICD-10-CM | POA: Diagnosis not present

## 2017-11-22 DIAGNOSIS — R2681 Unsteadiness on feet: Secondary | ICD-10-CM | POA: Diagnosis not present

## 2017-11-22 DIAGNOSIS — L821 Other seborrheic keratosis: Secondary | ICD-10-CM | POA: Diagnosis present

## 2017-11-22 DIAGNOSIS — R748 Abnormal levels of other serum enzymes: Secondary | ICD-10-CM | POA: Diagnosis not present

## 2017-11-22 DIAGNOSIS — G894 Chronic pain syndrome: Secondary | ICD-10-CM | POA: Diagnosis present

## 2017-11-22 DIAGNOSIS — Z981 Arthrodesis status: Secondary | ICD-10-CM | POA: Diagnosis not present

## 2017-11-22 DIAGNOSIS — N9489 Other specified conditions associated with female genital organs and menstrual cycle: Secondary | ICD-10-CM | POA: Diagnosis not present

## 2017-11-22 DIAGNOSIS — F330 Major depressive disorder, recurrent, mild: Secondary | ICD-10-CM | POA: Diagnosis not present

## 2017-11-22 DIAGNOSIS — R9431 Abnormal electrocardiogram [ECG] [EKG]: Secondary | ICD-10-CM | POA: Diagnosis not present

## 2017-11-22 DIAGNOSIS — R339 Retention of urine, unspecified: Secondary | ICD-10-CM | POA: Diagnosis not present

## 2017-11-22 DIAGNOSIS — M24549 Contracture, unspecified hand: Secondary | ICD-10-CM | POA: Diagnosis not present

## 2017-11-22 DIAGNOSIS — M6281 Muscle weakness (generalized): Secondary | ICD-10-CM | POA: Diagnosis not present

## 2017-11-22 DIAGNOSIS — K219 Gastro-esophageal reflux disease without esophagitis: Secondary | ICD-10-CM | POA: Diagnosis present

## 2017-11-22 DIAGNOSIS — Z7901 Long term (current) use of anticoagulants: Secondary | ICD-10-CM | POA: Diagnosis not present

## 2017-11-22 DIAGNOSIS — A419 Sepsis, unspecified organism: Secondary | ICD-10-CM | POA: Diagnosis not present

## 2017-11-22 DIAGNOSIS — M24562 Contracture, left knee: Secondary | ICD-10-CM | POA: Diagnosis not present

## 2017-11-22 DIAGNOSIS — Z9049 Acquired absence of other specified parts of digestive tract: Secondary | ICD-10-CM | POA: Diagnosis not present

## 2017-11-22 DIAGNOSIS — N809 Endometriosis, unspecified: Secondary | ICD-10-CM | POA: Diagnosis not present

## 2017-11-22 DIAGNOSIS — Z86711 Personal history of pulmonary embolism: Secondary | ICD-10-CM | POA: Diagnosis not present

## 2017-11-22 DIAGNOSIS — R569 Unspecified convulsions: Secondary | ICD-10-CM | POA: Diagnosis not present

## 2017-11-22 DIAGNOSIS — M24561 Contracture, right knee: Secondary | ICD-10-CM | POA: Diagnosis not present

## 2017-11-22 DIAGNOSIS — F339 Major depressive disorder, recurrent, unspecified: Secondary | ICD-10-CM | POA: Diagnosis present

## 2017-11-22 DIAGNOSIS — G43909 Migraine, unspecified, not intractable, without status migrainosus: Secondary | ICD-10-CM | POA: Diagnosis present

## 2017-11-22 DIAGNOSIS — R10819 Abdominal tenderness, unspecified site: Secondary | ICD-10-CM | POA: Diagnosis not present

## 2017-11-22 DIAGNOSIS — Z8249 Family history of ischemic heart disease and other diseases of the circulatory system: Secondary | ICD-10-CM | POA: Diagnosis not present

## 2017-11-22 DIAGNOSIS — M24531 Contracture, right wrist: Secondary | ICD-10-CM | POA: Diagnosis not present

## 2017-11-22 DIAGNOSIS — Z9359 Other cystostomy status: Secondary | ICD-10-CM | POA: Diagnosis not present

## 2017-11-22 DIAGNOSIS — Z993 Dependence on wheelchair: Secondary | ICD-10-CM | POA: Diagnosis not present

## 2017-11-22 DIAGNOSIS — Y95 Nosocomial condition: Secondary | ICD-10-CM | POA: Diagnosis present

## 2017-11-22 DIAGNOSIS — M1990 Unspecified osteoarthritis, unspecified site: Secondary | ICD-10-CM | POA: Diagnosis present

## 2017-11-22 DIAGNOSIS — F411 Generalized anxiety disorder: Secondary | ICD-10-CM | POA: Diagnosis not present

## 2017-11-22 DIAGNOSIS — R278 Other lack of coordination: Secondary | ICD-10-CM | POA: Diagnosis not present

## 2017-11-22 DIAGNOSIS — N390 Urinary tract infection, site not specified: Secondary | ICD-10-CM | POA: Diagnosis present

## 2017-11-22 DIAGNOSIS — Z803 Family history of malignant neoplasm of breast: Secondary | ICD-10-CM | POA: Diagnosis not present

## 2017-11-22 DIAGNOSIS — M2450 Contracture, unspecified joint: Secondary | ICD-10-CM | POA: Diagnosis present

## 2017-11-22 DIAGNOSIS — Z90710 Acquired absence of both cervix and uterus: Secondary | ICD-10-CM | POA: Diagnosis not present

## 2017-11-22 DIAGNOSIS — I2782 Chronic pulmonary embolism: Secondary | ICD-10-CM | POA: Diagnosis not present

## 2017-11-22 DIAGNOSIS — Z419 Encounter for procedure for purposes other than remedying health state, unspecified: Secondary | ICD-10-CM | POA: Diagnosis not present

## 2017-11-22 DIAGNOSIS — Z825 Family history of asthma and other chronic lower respiratory diseases: Secondary | ICD-10-CM | POA: Diagnosis not present

## 2017-11-22 DIAGNOSIS — D638 Anemia in other chronic diseases classified elsewhere: Secondary | ICD-10-CM | POA: Diagnosis not present

## 2017-11-22 DIAGNOSIS — F429 Obsessive-compulsive disorder, unspecified: Secondary | ICD-10-CM | POA: Diagnosis present

## 2017-11-22 DIAGNOSIS — R740 Nonspecific elevation of levels of transaminase and lactic acid dehydrogenase [LDH]: Secondary | ICD-10-CM | POA: Diagnosis not present

## 2017-11-22 DIAGNOSIS — N319 Neuromuscular dysfunction of bladder, unspecified: Secondary | ICD-10-CM | POA: Diagnosis present

## 2017-11-22 DIAGNOSIS — R1311 Dysphagia, oral phase: Secondary | ICD-10-CM | POA: Diagnosis not present

## 2017-11-22 DIAGNOSIS — Z820 Family history of epilepsy and other diseases of the nervous system: Secondary | ICD-10-CM | POA: Diagnosis not present

## 2017-11-22 DIAGNOSIS — Z800 Family history of malignant neoplasm of digestive organs: Secondary | ICD-10-CM | POA: Diagnosis not present

## 2017-11-22 DIAGNOSIS — R609 Edema, unspecified: Secondary | ICD-10-CM | POA: Diagnosis not present

## 2017-11-22 DIAGNOSIS — R482 Apraxia: Secondary | ICD-10-CM | POA: Diagnosis not present

## 2017-11-22 DIAGNOSIS — R52 Pain, unspecified: Secondary | ICD-10-CM | POA: Diagnosis not present

## 2017-11-22 DIAGNOSIS — Z885 Allergy status to narcotic agent status: Secondary | ICD-10-CM | POA: Diagnosis not present

## 2017-11-22 DIAGNOSIS — G801 Spastic diplegic cerebral palsy: Secondary | ICD-10-CM | POA: Diagnosis not present

## 2017-11-22 DIAGNOSIS — R509 Fever, unspecified: Secondary | ICD-10-CM | POA: Diagnosis not present

## 2017-11-22 DIAGNOSIS — G800 Spastic quadriplegic cerebral palsy: Secondary | ICD-10-CM | POA: Diagnosis present

## 2017-11-24 ENCOUNTER — Other Ambulatory Visit: Payer: Self-pay

## 2017-11-24 ENCOUNTER — Emergency Department (HOSPITAL_COMMUNITY): Payer: Medicare Other

## 2017-11-24 ENCOUNTER — Encounter (HOSPITAL_COMMUNITY): Payer: Self-pay

## 2017-11-24 ENCOUNTER — Inpatient Hospital Stay (HOSPITAL_COMMUNITY)
Admission: EM | Admit: 2017-11-24 | Discharge: 2017-11-27 | DRG: 689 | Disposition: A | Discharge status: Skilled Nursing Facility | Payer: Medicare Other | Attending: Family Medicine | Admitting: Family Medicine

## 2017-11-24 ENCOUNTER — Inpatient Hospital Stay (HOSPITAL_COMMUNITY): Payer: Medicare Other

## 2017-11-24 DIAGNOSIS — G2581 Restless legs syndrome: Secondary | ICD-10-CM | POA: Diagnosis not present

## 2017-11-24 DIAGNOSIS — G894 Chronic pain syndrome: Secondary | ICD-10-CM | POA: Diagnosis present

## 2017-11-24 DIAGNOSIS — J189 Pneumonia, unspecified organism: Secondary | ICD-10-CM | POA: Diagnosis not present

## 2017-11-24 DIAGNOSIS — R569 Unspecified convulsions: Secondary | ICD-10-CM | POA: Diagnosis not present

## 2017-11-24 DIAGNOSIS — M2450 Contracture, unspecified joint: Secondary | ICD-10-CM | POA: Diagnosis present

## 2017-11-24 DIAGNOSIS — M542 Cervicalgia: Secondary | ICD-10-CM | POA: Diagnosis not present

## 2017-11-24 DIAGNOSIS — L821 Other seborrheic keratosis: Secondary | ICD-10-CM | POA: Diagnosis present

## 2017-11-24 DIAGNOSIS — Z993 Dependence on wheelchair: Secondary | ICD-10-CM

## 2017-11-24 DIAGNOSIS — I2699 Other pulmonary embolism without acute cor pulmonale: Secondary | ICD-10-CM | POA: Diagnosis present

## 2017-11-24 DIAGNOSIS — N9489 Other specified conditions associated with female genital organs and menstrual cycle: Secondary | ICD-10-CM | POA: Diagnosis not present

## 2017-11-24 DIAGNOSIS — N390 Urinary tract infection, site not specified: Principal | ICD-10-CM | POA: Diagnosis present

## 2017-11-24 DIAGNOSIS — K089 Disorder of teeth and supporting structures, unspecified: Secondary | ICD-10-CM

## 2017-11-24 DIAGNOSIS — G801 Spastic diplegic cerebral palsy: Secondary | ICD-10-CM

## 2017-11-24 DIAGNOSIS — K08109 Complete loss of teeth, unspecified cause, unspecified class: Secondary | ICD-10-CM | POA: Diagnosis not present

## 2017-11-24 DIAGNOSIS — F411 Generalized anxiety disorder: Secondary | ICD-10-CM | POA: Diagnosis not present

## 2017-11-24 DIAGNOSIS — G808 Other cerebral palsy: Secondary | ICD-10-CM | POA: Diagnosis not present

## 2017-11-24 DIAGNOSIS — Z90710 Acquired absence of both cervix and uterus: Secondary | ICD-10-CM

## 2017-11-24 DIAGNOSIS — Z981 Arthrodesis status: Secondary | ICD-10-CM

## 2017-11-24 DIAGNOSIS — Z0000 Encounter for general adult medical examination without abnormal findings: Secondary | ICD-10-CM | POA: Diagnosis not present

## 2017-11-24 DIAGNOSIS — Z885 Allergy status to narcotic agent status: Secondary | ICD-10-CM | POA: Diagnosis not present

## 2017-11-24 DIAGNOSIS — F339 Major depressive disorder, recurrent, unspecified: Secondary | ICD-10-CM | POA: Diagnosis present

## 2017-11-24 DIAGNOSIS — M24541 Contracture, right hand: Secondary | ICD-10-CM | POA: Diagnosis not present

## 2017-11-24 DIAGNOSIS — R52 Pain, unspecified: Secondary | ICD-10-CM | POA: Diagnosis not present

## 2017-11-24 DIAGNOSIS — R1011 Right upper quadrant pain: Secondary | ICD-10-CM

## 2017-11-24 DIAGNOSIS — Z9049 Acquired absence of other specified parts of digestive tract: Secondary | ICD-10-CM | POA: Diagnosis not present

## 2017-11-24 DIAGNOSIS — F418 Other specified anxiety disorders: Secondary | ICD-10-CM | POA: Diagnosis not present

## 2017-11-24 DIAGNOSIS — K0889 Other specified disorders of teeth and supporting structures: Secondary | ICD-10-CM | POA: Diagnosis not present

## 2017-11-24 DIAGNOSIS — A419 Sepsis, unspecified organism: Secondary | ICD-10-CM | POA: Diagnosis not present

## 2017-11-24 DIAGNOSIS — Z419 Encounter for procedure for purposes other than remedying health state, unspecified: Secondary | ICD-10-CM | POA: Diagnosis not present

## 2017-11-24 DIAGNOSIS — K219 Gastro-esophageal reflux disease without esophagitis: Secondary | ICD-10-CM | POA: Diagnosis present

## 2017-11-24 DIAGNOSIS — Z7901 Long term (current) use of anticoagulants: Secondary | ICD-10-CM

## 2017-11-24 DIAGNOSIS — F330 Major depressive disorder, recurrent, mild: Secondary | ICD-10-CM | POA: Diagnosis not present

## 2017-11-24 DIAGNOSIS — R740 Nonspecific elevation of levels of transaminase and lactic acid dehydrogenase [LDH]: Secondary | ICD-10-CM

## 2017-11-24 DIAGNOSIS — N319 Neuromuscular dysfunction of bladder, unspecified: Secondary | ICD-10-CM | POA: Diagnosis present

## 2017-11-24 DIAGNOSIS — Z800 Family history of malignant neoplasm of digestive organs: Secondary | ICD-10-CM

## 2017-11-24 DIAGNOSIS — F429 Obsessive-compulsive disorder, unspecified: Secondary | ICD-10-CM | POA: Diagnosis present

## 2017-11-24 DIAGNOSIS — Z8249 Family history of ischemic heart disease and other diseases of the circulatory system: Secondary | ICD-10-CM

## 2017-11-24 DIAGNOSIS — Z86711 Personal history of pulmonary embolism: Secondary | ICD-10-CM | POA: Diagnosis not present

## 2017-11-24 DIAGNOSIS — R1319 Other dysphagia: Secondary | ICD-10-CM | POA: Diagnosis not present

## 2017-11-24 DIAGNOSIS — I10 Essential (primary) hypertension: Secondary | ICD-10-CM | POA: Diagnosis present

## 2017-11-24 DIAGNOSIS — G800 Spastic quadriplegic cerebral palsy: Secondary | ICD-10-CM | POA: Diagnosis present

## 2017-11-24 DIAGNOSIS — M5412 Radiculopathy, cervical region: Secondary | ICD-10-CM | POA: Diagnosis not present

## 2017-11-24 DIAGNOSIS — G8929 Other chronic pain: Secondary | ICD-10-CM | POA: Diagnosis not present

## 2017-11-24 DIAGNOSIS — R10819 Abdominal tenderness, unspecified site: Secondary | ICD-10-CM | POA: Diagnosis not present

## 2017-11-24 DIAGNOSIS — R471 Dysarthria and anarthria: Secondary | ICD-10-CM | POA: Diagnosis not present

## 2017-11-24 DIAGNOSIS — K5900 Constipation, unspecified: Secondary | ICD-10-CM | POA: Diagnosis not present

## 2017-11-24 DIAGNOSIS — D638 Anemia in other chronic diseases classified elsewhere: Secondary | ICD-10-CM | POA: Diagnosis not present

## 2017-11-24 DIAGNOSIS — R482 Apraxia: Secondary | ICD-10-CM | POA: Diagnosis not present

## 2017-11-24 DIAGNOSIS — Z803 Family history of malignant neoplasm of breast: Secondary | ICD-10-CM | POA: Diagnosis not present

## 2017-11-24 DIAGNOSIS — R7401 Elevation of levels of liver transaminase levels: Secondary | ICD-10-CM

## 2017-11-24 DIAGNOSIS — K068 Other specified disorders of gingiva and edentulous alveolar ridge: Secondary | ICD-10-CM

## 2017-11-24 DIAGNOSIS — Z9359 Other cystostomy status: Secondary | ICD-10-CM

## 2017-11-24 DIAGNOSIS — M4322 Fusion of spine, cervical region: Secondary | ICD-10-CM | POA: Diagnosis not present

## 2017-11-24 DIAGNOSIS — J80 Acute respiratory distress syndrome: Secondary | ICD-10-CM | POA: Diagnosis not present

## 2017-11-24 DIAGNOSIS — R509 Fever, unspecified: Secondary | ICD-10-CM | POA: Diagnosis not present

## 2017-11-24 DIAGNOSIS — G43909 Migraine, unspecified, not intractable, without status migrainosus: Secondary | ICD-10-CM | POA: Diagnosis present

## 2017-11-24 DIAGNOSIS — R748 Abnormal levels of other serum enzymes: Secondary | ICD-10-CM | POA: Diagnosis not present

## 2017-11-24 DIAGNOSIS — Z825 Family history of asthma and other chronic lower respiratory diseases: Secondary | ICD-10-CM

## 2017-11-24 DIAGNOSIS — Y95 Nosocomial condition: Secondary | ICD-10-CM | POA: Diagnosis present

## 2017-11-24 DIAGNOSIS — Z820 Family history of epilepsy and other diseases of the nervous system: Secondary | ICD-10-CM | POA: Diagnosis not present

## 2017-11-24 DIAGNOSIS — R498 Other voice and resonance disorders: Secondary | ICD-10-CM | POA: Diagnosis not present

## 2017-11-24 DIAGNOSIS — B964 Proteus (mirabilis) (morganii) as the cause of diseases classified elsewhere: Secondary | ICD-10-CM | POA: Diagnosis present

## 2017-11-24 DIAGNOSIS — G809 Cerebral palsy, unspecified: Secondary | ICD-10-CM | POA: Diagnosis not present

## 2017-11-24 DIAGNOSIS — M1990 Unspecified osteoarthritis, unspecified site: Secondary | ICD-10-CM | POA: Diagnosis present

## 2017-11-24 DIAGNOSIS — M6240 Contracture of muscle, unspecified site: Secondary | ICD-10-CM | POA: Diagnosis not present

## 2017-11-24 DIAGNOSIS — L731 Pseudofolliculitis barbae: Secondary | ICD-10-CM | POA: Diagnosis not present

## 2017-11-24 DIAGNOSIS — M6281 Muscle weakness (generalized): Secondary | ICD-10-CM | POA: Diagnosis not present

## 2017-11-24 DIAGNOSIS — Z23 Encounter for immunization: Secondary | ICD-10-CM | POA: Diagnosis not present

## 2017-11-24 DIAGNOSIS — R9431 Abnormal electrocardiogram [ECG] [EKG]: Secondary | ICD-10-CM | POA: Diagnosis not present

## 2017-11-24 DIAGNOSIS — R7989 Other specified abnormal findings of blood chemistry: Secondary | ICD-10-CM | POA: Diagnosis not present

## 2017-11-24 DIAGNOSIS — F23 Brief psychotic disorder: Secondary | ICD-10-CM | POA: Diagnosis not present

## 2017-11-24 DIAGNOSIS — N809 Endometriosis, unspecified: Secondary | ICD-10-CM | POA: Diagnosis not present

## 2017-11-24 DIAGNOSIS — M24531 Contracture, right wrist: Secondary | ICD-10-CM | POA: Diagnosis not present

## 2017-11-24 LAB — CBC WITH DIFFERENTIAL/PLATELET
BASOS ABS: 0 10*3/uL (ref 0.0–0.1)
Basophils Relative: 0 %
EOS ABS: 0.4 10*3/uL (ref 0.0–0.7)
EOS PCT: 5 %
HCT: 29.8 % — ABNORMAL LOW (ref 36.0–46.0)
Hemoglobin: 10.1 g/dL — ABNORMAL LOW (ref 12.0–15.0)
LYMPHS PCT: 22 %
Lymphs Abs: 1.7 10*3/uL (ref 0.7–4.0)
MCH: 31.4 pg (ref 26.0–34.0)
MCHC: 33.9 g/dL (ref 30.0–36.0)
MCV: 92.5 fL (ref 78.0–100.0)
MONO ABS: 0.8 10*3/uL (ref 0.1–1.0)
Monocytes Relative: 10 %
Neutro Abs: 5.1 10*3/uL (ref 1.7–7.7)
Neutrophils Relative %: 63 %
PLATELETS: 309 10*3/uL (ref 150–400)
RBC: 3.22 MIL/uL — ABNORMAL LOW (ref 3.87–5.11)
RDW: 14.5 % (ref 11.5–15.5)
WBC: 8 10*3/uL (ref 4.0–10.5)

## 2017-11-24 LAB — COMPREHENSIVE METABOLIC PANEL
ALBUMIN: 3 g/dL — AB (ref 3.5–5.0)
ALK PHOS: 198 U/L — AB (ref 38–126)
ALT: 94 U/L — AB (ref 14–54)
AST: 47 U/L — AB (ref 15–41)
Anion gap: 7 (ref 5–15)
BILIRUBIN TOTAL: 0.7 mg/dL (ref 0.3–1.2)
BUN: 7 mg/dL (ref 6–20)
CALCIUM: 8.6 mg/dL — AB (ref 8.9–10.3)
CO2: 28 mmol/L (ref 22–32)
CREATININE: 0.5 mg/dL (ref 0.44–1.00)
Chloride: 101 mmol/L (ref 101–111)
GFR calc Af Amer: >60 mL/min (ref >60)
GFR calc non Af Amer: >60 mL/min (ref >60)
GLUCOSE: 144 mg/dL — AB (ref 65–99)
Potassium: 4.1 mmol/L (ref 3.5–5.1)
Sodium: 136 mmol/L (ref 135–145)
TOTAL PROTEIN: 6.2 g/dL — AB (ref 6.5–8.1)

## 2017-11-24 LAB — URINALYSIS, ROUTINE W REFLEX MICROSCOPIC
Bilirubin Urine: NEGATIVE
GLUCOSE, UA: NEGATIVE mg/dL
HGB URINE DIPSTICK: NEGATIVE
Ketones, ur: NEGATIVE mg/dL
NITRITE: NEGATIVE
PH: 9 — AB (ref 5.0–8.0)
Protein, ur: 100 mg/dL — AB
SPECIFIC GRAVITY, URINE: 1.016 (ref 1.005–1.030)

## 2017-11-24 LAB — PROTIME-INR
INR: 1.06
PROTHROMBIN TIME: 13.7 s (ref 11.4–15.2)

## 2017-11-24 LAB — I-STAT CG4 LACTIC ACID, ED: Lactic Acid, Venous: 0.71 mmol/L (ref 0.5–1.9)

## 2017-11-24 LAB — MRSA PCR SCREENING: MRSA BY PCR: NEGATIVE

## 2017-11-24 LAB — HIV ANTIBODY (ROUTINE TESTING W REFLEX): HIV SCREEN 4TH GENERATION: NONREACTIVE

## 2017-11-24 LAB — PROCALCITONIN

## 2017-11-24 LAB — STREP PNEUMONIAE URINARY ANTIGEN: STREP PNEUMO URINARY ANTIGEN: NEGATIVE

## 2017-11-24 MED ORDER — AMANTADINE HCL 100 MG PO CAPS
100.0000 mg | ORAL_CAPSULE | Freq: Every day | ORAL | Status: DC
  Administered 2017-11-24 – 2017-11-27 (×4): 100 mg via ORAL
  Filled 2017-11-24 (×4): qty 1

## 2017-11-24 MED ORDER — AMANTADINE HCL 100 MG PO CAPS
100.00 | ORAL_CAPSULE | ORAL | Status: DC
Start: 2017-11-23 — End: 2017-11-24

## 2017-11-24 MED ORDER — QUETIAPINE FUMARATE 25 MG PO TABS
125.0000 mg | ORAL_TABLET | Freq: Every day | ORAL | Status: DC
  Administered 2017-11-24 – 2017-11-26 (×3): 125 mg via ORAL
  Filled 2017-11-24 (×3): qty 1
  Filled 2017-11-24: qty 2
  Filled 2017-11-24: qty 1
  Filled 2017-11-24: qty 2
  Filled 2017-11-24: qty 1

## 2017-11-24 MED ORDER — GUAIFENESIN 100 MG/5ML PO SYRP
200.00 | ORAL_SOLUTION | ORAL | Status: DC
Start: ? — End: 2017-11-24

## 2017-11-24 MED ORDER — HYDROMORPHONE HCL 2 MG PO TABS
2.00 | ORAL_TABLET | ORAL | Status: DC
Start: ? — End: 2017-11-24

## 2017-11-24 MED ORDER — LUBIPROSTONE 24 MCG PO CAPS
24.0000 ug | ORAL_CAPSULE | Freq: Two times a day (BID) | ORAL | Status: DC
  Administered 2017-11-24 – 2017-11-27 (×7): 24 ug via ORAL
  Filled 2017-11-24 (×7): qty 1

## 2017-11-24 MED ORDER — TIZANIDINE HCL 4 MG PO TABS
4.0000 mg | ORAL_TABLET | Freq: Two times a day (BID) | ORAL | Status: DC
  Administered 2017-11-24 – 2017-11-27 (×6): 4 mg via ORAL
  Filled 2017-11-24 (×6): qty 1

## 2017-11-24 MED ORDER — GENERIC EXTERNAL MEDICATION
6.25 | Status: DC
Start: ? — End: 2017-11-24

## 2017-11-24 MED ORDER — BIOTENE DRY MOUTH MT LIQD: 15.0000 mL | OROMUCOSAL | Status: DC | PRN

## 2017-11-24 MED ORDER — OXYBUTYNIN CHLORIDE 5 MG PO TABS
5.00 | ORAL_TABLET | ORAL | Status: DC
Start: 2017-11-22 — End: 2017-11-24

## 2017-11-24 MED ORDER — ONDANSETRON HCL 4 MG/2ML IJ SOLN
4.00 | INTRAMUSCULAR | Status: DC
Start: ? — End: 2017-11-24

## 2017-11-24 MED ORDER — SODIUM CHLORIDE 0.9 % IV BOLUS (SEPSIS)
1000.0000 mL | Freq: Once | INTRAVENOUS | Status: AC
  Administered 2017-11-24: 1000 mL via INTRAVENOUS

## 2017-11-24 MED ORDER — CELECOXIB 200 MG PO CAPS
200.00 | ORAL_CAPSULE | ORAL | Status: DC
Start: 2017-11-22 — End: 2017-11-24

## 2017-11-24 MED ORDER — MELATONIN 3 MG PO TABS
6.0000 mg | ORAL_TABLET | Freq: Every day | ORAL | Status: DC
  Administered 2017-11-24 – 2017-11-26 (×3): 6 mg via ORAL
  Filled 2017-11-24 (×4): qty 2

## 2017-11-24 MED ORDER — DEXTROSE 5 % IV SOLN
1.0000 g | Freq: Three times a day (TID) | INTRAVENOUS | Status: DC
  Administered 2017-11-24 – 2017-11-26 (×7): 1 g via INTRAVENOUS
  Filled 2017-11-24 (×8): qty 1

## 2017-11-24 MED ORDER — BACLOFEN 20 MG PO TABS
20.0000 mg | ORAL_TABLET | Freq: Four times a day (QID) | ORAL | Status: DC
Start: 2017-11-24 — End: 2017-11-27
  Administered 2017-11-24 – 2017-11-27 (×14): 20 mg via ORAL
  Filled 2017-11-24: qty 1
  Filled 2017-11-24: qty 2
  Filled 2017-11-24 (×5): qty 1
  Filled 2017-11-24 (×2): qty 2
  Filled 2017-11-24 (×3): qty 1
  Filled 2017-11-24 (×3): qty 2
  Filled 2017-11-24: qty 1
  Filled 2017-11-24: qty 2
  Filled 2017-11-24 (×5): qty 1
  Filled 2017-11-24 (×2): qty 2
  Filled 2017-11-24: qty 1
  Filled 2017-11-24: qty 2

## 2017-11-24 MED ORDER — FLUOXETINE HCL 20 MG PO CAPS
60.00 | ORAL_CAPSULE | ORAL | Status: DC
Start: 2017-11-23 — End: 2017-11-24

## 2017-11-24 MED ORDER — TIZANIDINE HCL 4 MG PO TABS
4.00 | ORAL_TABLET | ORAL | Status: DC
Start: 2017-11-22 — End: 2017-11-24

## 2017-11-24 MED ORDER — BISACODYL 10 MG RE SUPP: 10.0000 mg | RECTAL | Status: DC | PRN

## 2017-11-24 MED ORDER — FENTANYL 12 MCG/HR TD PT72
1.00 | MEDICATED_PATCH | TRANSDERMAL | Status: DC
Start: 2017-11-24 — End: 2017-11-24

## 2017-11-24 MED ORDER — BACLOFEN 10 MG PO TABS
20.00 | ORAL_TABLET | ORAL | Status: DC
Start: 2017-11-22 — End: 2017-11-24

## 2017-11-24 MED ORDER — VANCOMYCIN HCL 500 MG IV SOLR
500.0000 mg | Freq: Three times a day (TID) | INTRAVENOUS | Status: DC
  Administered 2017-11-24 – 2017-11-25 (×3): 500 mg via INTRAVENOUS
  Filled 2017-11-24 (×4): qty 500

## 2017-11-24 MED ORDER — MAGIC MOUTHWASH
10.0000 mL | Freq: Three times a day (TID) | ORAL | Status: DC
  Administered 2017-11-24 – 2017-11-27 (×10): 10 mL via ORAL
  Filled 2017-11-24 (×10): qty 10

## 2017-11-24 MED ORDER — FLUOXETINE HCL 20 MG PO CAPS
60.0000 mg | ORAL_CAPSULE | Freq: Every day | ORAL | Status: DC
  Administered 2017-11-24 – 2017-11-27 (×4): 60 mg via ORAL
  Filled 2017-11-24 (×4): qty 3

## 2017-11-24 MED ORDER — PREGABALIN 75 MG PO CAPS
75.00 | ORAL_CAPSULE | ORAL | Status: DC
Start: 2017-11-22 — End: 2017-11-24

## 2017-11-24 MED ORDER — FENTANYL 12 MCG/HR TD PT72
12.5000 ug | MEDICATED_PATCH | TRANSDERMAL | Status: DC
  Administered 2017-11-24 – 2017-11-27 (×2): 12.5 ug via TRANSDERMAL
  Filled 2017-11-24 (×2): qty 1

## 2017-11-24 MED ORDER — ENSURE ENLIVE PO LIQD
237.0000 mL | Freq: Two times a day (BID) | ORAL | Status: DC
  Administered 2017-11-26 – 2017-11-27 (×3): 237 mL via ORAL

## 2017-11-24 MED ORDER — ACETAMINOPHEN 325 MG PO TABS
650.0000 mg | ORAL_TABLET | Freq: Four times a day (QID) | ORAL | Status: DC | PRN
  Administered 2017-11-24 – 2017-11-26 (×6): 650 mg via ORAL
  Filled 2017-11-24 (×6): qty 2

## 2017-11-24 MED ORDER — LAMOTRIGINE 100 MG PO TABS
100.0000 mg | ORAL_TABLET | Freq: Every day | ORAL | Status: DC
  Administered 2017-11-24 – 2017-11-26 (×3): 100 mg via ORAL
  Filled 2017-11-24 (×3): qty 1

## 2017-11-24 MED ORDER — HEPARIN SODIUM (PORCINE) 5000 UNIT/ML IJ SOLN
5000.00 | INTRAMUSCULAR | Status: DC
Start: 2017-11-22 — End: 2017-11-24

## 2017-11-24 MED ORDER — LAMOTRIGINE 100 MG PO TABS
100.00 | ORAL_TABLET | ORAL | Status: DC
Start: 2017-11-22 — End: 2017-11-24

## 2017-11-24 MED ORDER — TIZANIDINE HCL 4 MG PO TABS
4.0000 mg | ORAL_TABLET | Freq: Two times a day (BID) | ORAL | Status: DC
  Administered 2017-11-24 (×2): 4 mg via ORAL
  Filled 2017-11-24 (×2): qty 1

## 2017-11-24 MED ORDER — HYDROMORPHONE HCL 2 MG PO TABS
2.0000 mg | ORAL_TABLET | ORAL | Status: DC | PRN
  Administered 2017-11-24 – 2017-11-27 (×15): 2 mg via ORAL
  Filled 2017-11-24 (×15): qty 1

## 2017-11-24 MED ORDER — BOOST PO LIQD
237.0000 mL | Freq: Two times a day (BID) | ORAL | Status: DC
  Administered 2017-11-24 – 2017-11-27 (×7): 237 mL via ORAL
  Filled 2017-11-24 (×10): qty 237

## 2017-11-24 MED ORDER — PIPERACILLIN-TAZOBACTAM 3.375 G IVPB 30 MIN
3.3750 g | Freq: Once | INTRAVENOUS | Status: AC
  Administered 2017-11-24: 3.375 g via INTRAVENOUS
  Filled 2017-11-24: qty 50

## 2017-11-24 MED ORDER — VANCOMYCIN HCL IN DEXTROSE 1-5 GM/200ML-% IV SOLN
1000.0000 mg | Freq: Once | INTRAVENOUS | Status: AC
  Administered 2017-11-24: 1000 mg via INTRAVENOUS
  Filled 2017-11-24: qty 200

## 2017-11-24 MED ORDER — ACETAMINOPHEN 325 MG PO TABS
650.00 | ORAL_TABLET | ORAL | Status: DC
Start: 2017-11-22 — End: 2017-11-24

## 2017-11-24 MED ORDER — GENERIC EXTERNAL MEDICATION
Status: DC
Start: ? — End: 2017-11-24

## 2017-11-24 MED ORDER — OXYBUTYNIN CHLORIDE 5 MG PO TABS
5.0000 mg | ORAL_TABLET | Freq: Four times a day (QID) | ORAL | Status: DC
  Administered 2017-11-24 – 2017-11-27 (×14): 5 mg via ORAL
  Filled 2017-11-24 (×14): qty 1

## 2017-11-24 MED ORDER — POLYETHYLENE GLYCOL 3350 17 G PO PACK
17.0000 g | PACK | Freq: Two times a day (BID) | ORAL | Status: DC
  Administered 2017-11-24 – 2017-11-27 (×7): 17 g via ORAL
  Filled 2017-11-24 (×7): qty 1

## 2017-11-24 MED ORDER — SODIUM CHLORIDE 0.9 % IV BOLUS (SEPSIS)
500.0000 mL | Freq: Once | INTRAVENOUS | Status: AC
  Administered 2017-11-24: 500 mL via INTRAVENOUS

## 2017-11-24 MED ORDER — CLONAZEPAM 0.5 MG PO TABS: 0.7500 mg | ORAL_TABLET | Freq: Three times a day (TID) | ORAL | Status: DC

## 2017-11-24 MED ORDER — CLONAZEPAM 0.5 MG PO TBDP
0.7500 mg | ORAL_TABLET | Freq: Three times a day (TID) | ORAL | Status: DC
  Administered 2017-11-24 – 2017-11-27 (×10): 0.75 mg via ORAL
  Filled 2017-11-24 (×10): qty 1

## 2017-11-24 NOTE — ED Notes (Signed)
Spoke with father on the phone. Updated him on pt status. Per md, to admit pt for pneumonia. VSS.

## 2017-11-24 NOTE — Progress Notes (Signed)
Pharmacy Antibiotic Note  [name] [name] is a 41 y.o. female admitted on 11/24/2017 with pneumonia.  Pharmacy has been consulted for Vancomycin dosing. Presents to ED with reports of fever up to 101.4 at nursing facility. WBC WNL. Renal function appears OK but may be harder to estimate in patient with low muscle mass.   Plan: -Vancomycin 500 mg IV q8h -Cefepime per MD -Trend WBC, temp, renal function  -F/U infectious work-up -Drug levels ASAP with low body weight/muscle mass      Temp (24hrs), Avg:98.9 F (37.2 C), Min:98.2 F (36.8 C), Max:99.6 F (37.6 C)  Recent Labs  Lab 11/24/17 0120 11/24/17 0149  WBC 8.0  --   CREATININE 0.50  --   LATICACIDVEN  --  0.71    CrCl cannot be calculated (Unknown ideal weight.).    Allergies  Allergen Reactions  . Morphine Dermatitis and Other (See Comments)    Skin turned red   [name] [name], [name] [name] 11/24/2017 3:34 AM

## 2017-11-24 NOTE — ED Provider Notes (Signed)
TIME SEEN: 1:09 AM  CHIEF COMPLAINT: Fever, possible pneumonia  HPI: Patient is a 41 year old female with history of spastic quadriparesis cerebral palsy, PE in 2012 who is on Coumadin, neurogenic bladder status post suprapubic catheter who underwent C4 through T1 ACDF and C2 through T4 posterior fusion by Dr. Andrey CampanileWilson on 11/17/17 at [facility] who [facility] who presents to the emergency department today with possible bilateral pneumonia and possible left pneumothorax seen on a chest x-ray at the rehab facility.  Rehab facility reported fever of 101.4.  At this time she is not on antibiotics.  Patient only complains of neck pain.  She is on multiple medications for chronic pain including oxycodone, Dilaudid, fentanyl.  She also has a baclofen pump.  Patient denies chest pain or shortness of breath.  No hypoxia.  Was given Dilaudid just prior to arrival and is very drowsy.  No vomiting or diarrhea.  ROS: See HPI Constitutional:  fever  Eyes: no drainage  ENT: no runny nose   Cardiovascular:  no chest pain  Resp: no SOB  GI: no vomiting GU: no dysuria Integumentary: no rash  Allergy: no hives  Musculoskeletal: no leg swelling  Neurological: no slurred speech ROS otherwise negative  PAST MEDICAL HISTORY/PAST SURGICAL HISTORY:  Past Medical History:  Diagnosis Date  . Abdominal pain 01/02/2012   Overview:  Overview:  Per Previous pcp note- Dr. Ta--Pt has history of endometriosis and had DUB in 2011.  She She had u/s that showed normal endometrial stripe.  She had endometrial bx that was wnl.  She has a lot of abd cramping every month.  This cramping was sometimes not relieved with hydrocodone.  [facility] for IUD placement to help endometriosis and abd cramping.  [name]  . Abdominal pain 01/02/2012   Overview:  Overview:  Per Previous pcp note- Dr. Ta--Pt has history of endometriosis and had DUB in 2011.  She She had u/s that showed normal endometrial stripe.   She had endometrial bx that was wnl.  She has a lot of abd cramping every month.  This cramping was sometimes not relieved with hydrocodone.  [facility] for IUD placement to help endometriosis and abd cramping.  [name] [facility].  Pt still complains of abd cramping, which I am treating with hydrocodone and ultram. Pt reports some initial relief of pain from IUD, but states it has now returned back to similar to previous cramping. Gave patient a shot of Toradol 60 mg IM today-since positive exacerbation of abdominal cramping during past 2 weeks. Physical exam reassuring. Patient may need to return [facility] work[facility] since she is plugged in with the GYN providers there for further treatment options regarding endometriosis and abdominal cramping. The dysfunctional uterine bleeding now well contr  . Acute cystitis with hematuria   . Acute cystitis without hematuria   . Acute respiratory failure with hypoxia ([name])   . Anemia   . Cellulitis 12/22/2014  . Cerebral palsy ([name])    Spastic Cerebral palsy, mentally intact  . Contracture, joint, multiple sites    Electric wheelchair, uses left hand to operate chair.   . Depression   . Disease of female genital organs 10/24/2010   Overview:  Overview:  Pt has history of endometriosis and had DUB in 2011.  She had u/s that showed normal endometrial stripe.  She had endometrial bx that was wnl.  She has a  lot of abd cramping every month.  This cramping was sometimes not relieved with hydrocodone.  She was referred to [facility] for IUD placement to help endometriosis and abd cramping.  [name] was placed 04/30/11 by [facility].  Pt still complains of abd cramping, which was treated with hydrocodone and ultram.   Last [facility] at [facility] by OB/GYN 12/24/11, given Depo Lupron injection.  Ordering CT scan to r/o additional cause of abdominal pain (pt with known endometriosis and will not tolerate vaginal U/S, pain not  improved s/p [name] or with Lupron injection-- only caused hot flashes and amenorrhea, no pain relief).  Also made referral to pain clinic for better pain control.  Do not think hysterectomy would help with pain, and concern for contamination of baclofen pump if did proceed with surgery.    8/5/13[name] OB/GYN: Pt was seen by   . [name]   . Dysarthria   . Dyspnea and respiratory abnormality 02/04/2011   Overview:  Overview:  Pt has history of recurrent PE and is on chronic coumadin.  Pt has been complaining of dyspnea that she describes as chest tightness.  She has been to the ER multiple times for this.  Usually she gets CTA and serial CE.  She has had at least 6 CTA in a period of since 2011.  She also has an O2 requirement. It is unclear why pt feels dyspneic, complains of chest tightness, or has O2 requirement.  I thought that her complaint may be from deconditioning and referred her to PT.  Unfortunately Medicaid only pays for 4 PT visits.  Pt should be doing these exercises at home, but she has not.  I have referred her to [facility], and she has been seen by [name].  He did not understand the etiology of her dyspnea and O2 requirement.  He will continue to follow her.   Last Assessment & Plan:  Pt presents with concerns for chest tightness and dyspnea.  She was seen in ED yesterday and discharged home.  While there she had a CT chest 04/20/11 showed 1.  Multifocal degradation as detailed above.  N  . Endometriosis   . GERD (gastroesophageal reflux disease)   . Gram positive sepsis ([name]) 10/24/2014  . Gross hematuria 03/12/2013   Overview:  Overview:  status post replacement of suprapubic tube 03/04/2013  Last Assessment & Plan:  Recent urine culture collected in ED on 5/4 did not result in significant growth. Moreover, patient has been asymptomatic with normal WBC, no fever and no true symptoms making symptomatic UTI less likely.  Will continue to monitor for any sign of  infection.  Patient's xarelto had been held during her recent hospitalization due to hematuria. This has since been re-started. Monitor bleeding.    [name] Kitchen HCAP (healthcare-associated pneumonia) 10/25/2014  . History of anticoagulant therapy 10/21/2012  . History of endometriosis 10/2012   OBGYN [facility]: Laparoscopic Endometrial Ablation, TVH,  . History of recurrent UTIs   . Hypertension   . HYPERTENSION, BENIGN 01/31/2010   Qualifier: Diagnosis of  By: [name], MD, [name] Incontinent of feces 03/11/2016  . Infantile cerebral palsy ([name]) 01/29/2007   Overview:  She has spastic CP.  She is mentally intact.  She is wheelchair bound.  She is wheelchair bound for ambulation      . Intracervical pessary 05/07/2011   Overview:  Overview:  Placed by [facility] 04/30/11 to treat DUB and Endometriosis.  She is  [facility] but was considered a poor surgical candidate and [facility].  For DUB she had pelvic ultrasound that showed thin stripe and she had endometrial Bx by Dr Jennette KettleNeal in GYN clinic, which was negative.     . Macroscopic hematuria 03/10/2013   status post replacement of suprapubic tube 03/04/2013   . Migraine   . OCD (obsessive compulsive disorder)   . Parapneumonic effusion 10/25/2014  . Presence of intrathecal baclofen pump 03/07/2014   R LQ. Insertion T10.    [name] Kitchen. Psychiatric illness 10/31/2014  . Pulmonary embolism ([name])    Lifetime Coumadin  . Pulmonary embolism ([name]) 2011, 01/2011   Will be on lifetime coumadin  . Recurrent pulmonary embolism ([name]) 05/03/2011   Pt has history of recurrent PE and is on chronic coumadin.  Pt has been complaining of dyspnea that she describes as chest tightness.  She has been to the ER multiple times for this.  Usually she gets CTA and serial CE.  She has had at least 6 CTA in a period of since 2011.  She also has an O2 requirement. It is unclear why pt feels dyspneic, complains of chest tightness, or has O2 requirement.  I thought  that her complaint may be from deconditioning and referred her to PT.  Unfortunately Medicaid only pays for 4 PT visits.  Pt should be doing these exercises at home, but she has not.  [facility], and she has been seen by Dr Marchelle Gearingamaswamy.  He did not understand the etiology of her dyspnea and O2 requirement.  He will continue to follow her.     . Seborrheic keratosis 01/18/2011  . Seizures ([name]) 02/12/2016  . Spasticity 01/05/2014  . Transient alteration of awareness, recurrent 09/08/2006   Recurrent episodes where [name]  loses contact with her world and that have been studied thoroughly and appear nonepileptic in nature per Dr Terrace ArabiaYan (neurology) The patient has had episodes starting in 2007 that initially began 1-4 times a day during which time she would have periods of unawareness followed by confusion. This continuous video EEG monitoring performed from October 8-12, 2007. Had   . Urethra dilated and patulous 07/03/2015   Patulous, Dilated Urethra. 5mL balloon on a 22-Fr catheter hoping this would prevent the bladder spasm from pushing the balloon down the urethra (Dr Logan BoresEvans, Urology [facility], July 2016)     MEDICATIONS:  Prior to Admission medications   Medication Sig Start Date End Date Taking? Authorizing Provider  acetaminophen (TYLENOL) 500 MG tablet Take 500-650 mg by mouth See admin instructions. 500mg  three times daily and 650 three times daily as needed for pain (do not exceed 4000mg  in 24hrs)    [provider]  amantadine (SYMMETREL) 100 MG capsule Take 100 mg by mouth daily.     [provider]  antiseptic oral rinse (BIOTENE) LIQD 15 mLs by Mouth Rinse route as needed for dry mouth.    [provider]  baclofen (LIORESAL) 20 MG tablet Take 20 mg by mouth 4 (four) times daily. For bladder spasms    [provider]  bisacodyl (DULCOLAX) 10 MG suppository Place 10 mg rectally as needed for moderate constipation.    [provider]   clonazePAM (KLONOPIN) 0.5 MG tablet Take 0.75 mg by mouth 3 (three) times daily.     [provider]  fentaNYL (DURAGESIC - DOSED MCG/HR) 12 MCG/HR Place 12.5 mcg onto the skin every 3 (three) days.  [provider]  FLUoxetine HCl 60 MG TABS Take 60 mg by mouth daily.     [provider]  lactose free nutrition (BOOST) LIQD Take 237 mLs by mouth 2 (two) times daily between meals.    [provider]  lamoTRIgine (LAMICTAL) 100 MG tablet Take 100 mg by mouth at bedtime.    [provider]  lubiprostone (AMITIZA) 24 MCG capsule Take 1 capsule (24 mcg total) by mouth 2 (two) times daily with a meal. 01/01/17   Esterwood, [name] S, PA-C  Melatonin 5 MG TABS Take 5 mg by mouth at bedtime.     [provider]  Multiple Vitamins-Minerals (CERTA-VITE PO) Take 1 tablet by mouth daily.    [provider]  oxybutynin (DITROPAN) 5 MG tablet Take 5 mg by mouth 4 (four) times daily.  02/20/16   [provider]  oxyCODONE (ROXICODONE) 5 MG immediate release tablet Take one tablet by mouth every 4 hours as needed for pain Patient taking differently: Take 5-10 mg by mouth 3 (three) times daily as needed. 5mg  three times daily as needed for pain 1-5 out of 10 10mg  three times daily as needed for pain 6-10 out of 10 07/10/16   Kimber Relic, MD  polyethylene glycol powder (GLYCOLAX/MIRALAX) powder Take 1  1/2 dose  daily in 12 ounces of fluid every day. Patient taking differently: Take 17 g by mouth daily. Mix with 4-8 oz of liquid daily 01/01/17   Esterwood, [name] S, PA-C  QUEtiapine (SEROQUEL) 100 MG tablet Take 1 tablet (100 mg total) by mouth at bedtime. Patient taking differently: Take 125 mg by mouth at bedtime.  06/21/15   Tyrone Nine, MD  tiZANidine (ZANAFLEX) 4 MG capsule Take 4 mg by mouth 2 (two) times daily.     [provider]  warfarin (COUMADIN) 2 MG tablet Take 4.5 mg by mouth daily at 6 PM.     [provider]     ALLERGIES:  Allergies  Allergen Reactions  . Morphine Dermatitis and Other (See Comments)    Skin turned red    SOCIAL HISTORY:  Social History   Tobacco Use  . Smoking status: Never Smoker  . Smokeless tobacco: Never Used  Substance Use Topics  . Alcohol use: Yes    Alcohol/week: 0.6 oz    Types: 1 Glasses of wine per week    Comment: Drinks alcohol once a month.    FAMILY HISTORY: Family History  Problem Relation Age of Onset  . Asthma Father   . Colon cancer Maternal Grandmother        Died in her 44's  . Breast cancer Paternal Grandmother        Died in her 45's  . Heart attack Maternal Grandfather        Died in his 41's  . Alzheimer's disease Paternal Grandfather        Died in his 3's  . Breast cancer Mother   . Stomach cancer Neg Hx     EXAM: BP 103/71 (BP Location: Left Arm)   Pulse 86   Temp 98.2 F (36.8 C) (Oral)   Resp 16   LMP 04/05/2011   SpO2 96%  CONSTITUTIONAL: Alert and oriented and responds appropriately to questions.  Drowsy but arousable to voice.  Chronically ill-appearing. HEAD: Normocephalic EYES: Conjunctivae clear, pupils appear equal, EOMI ENT: normal nose; moist mucous membranes NECK: Supple, no meningismus, no nuchal rigidity, no LAD, has a healing surgical scar noted  to the right side of the anterior neck that is healing well with no spreading redness, warmth, ecchymosis or drainage CARD: RRR; S1 and S2 appreciated; no murmurs, no clicks, no rubs, no gallops RESP: Normal chest excursion without splinting or tachypnea; breath sounds clear and equal bilaterally; no wheezes, no rhonchi, no rales, no hypoxia or respiratory distress, speaking full sentences ABD/GI: Normal bowel sounds; non-distended; soft, non-tender, no rebound, no guarding, no peritoneal signs, no hepatosplenomegaly, suprapubic catheter in place without drainage, surrounding warmth, erythema or redness BACK:  The back appears normal and is non-tender to palpation,  there is no CVA tenderness EXT: Normal ROM in all joints; non-tender to palpation; no edema; normal capillary refill; no cyanosis, no calf tenderness or swelling    SKIN: Normal color for age and race; warm; no rash NEURO: Spastic quadriparesis PSYCH: The patient's mood and manner are appropriate. Grooming and personal hygiene are appropriate.  MEDICAL DECISION MAKING: Patient here with concern for sepsis.  X-ray done at her rehab facility showed bilateral infiltrates and possible left pneumothorax.  No hypoxia or respiratory distress here.  We will start septic workup.  Will obtain labs, cultures, urine, rectal temperature.  Will give IV fluids and broad-spectrum antibiotics.  ED PROGRESS: Patient's rectal temp is 99.6.  She was febrile at her facility prior to arrival.  Lactate normal.  No leukocytosis.  Chest x-ray shows perihilar streaky densities which I favor to be infiltrates especially given her history.  She has received broad-spectrum antibiotics.  Will discuss with medicine for admission for HCAP.  Patient's father has been updated by the nurse over the phone.  No sign of pneumothorax on chest x-ray.   2:58 AM Discussed patient's case with hospitalist, Dr. Antionette Char.  I have recommended admission and patient (and family if present) agree with this plan. Admitting physician will place admission orders.   I reviewed all nursing notes, vitals, pertinent previous records, EKGs, lab and urine results, imaging (as available).       EKG Interpretation  Date/Time:  Monday November 24 2017 00:54:07 EST Ventricular Rate:  78 PR Interval:    QRS Duration: 85 QT Interval:  387 QTC Calculation: 441 R Axis:   22 Text Interpretation:  Sinus rhythm Borderline T abnormalities, inferior leads No significant change since last tracing Confirmed by Nishita Isaacks, Baxter Hire [phone]) on 11/24/2017 1:51:08 AM       CRITICAL CARE Performed by: Baxter Hire Anddy Wingert   Total critical care time: 35 minutes  Critical  care time was exclusive of separately billable procedures and treating other patients.  Critical care was necessary to treat or prevent imminent or life-threatening deterioration.  Critical care was time spent personally by me on the following activities: development of treatment plan with patient and/or surrogate as well as nursing, discussions with consultants, evaluation of patient's response to treatment, examination of patient, obtaining history from patient or surrogate, ordering and performing treatments and interventions, ordering and review of laboratory studies, ordering and review of radiographic studies, pulse oximetry and re-evaluation of patient's condition.    Kiyan Burmester, Layla Maw, DO 11/24/17 [phone]

## 2017-11-24 NOTE — ED Notes (Signed)
RN notified of Sepsis  

## 2017-11-24 NOTE — ED Triage Notes (Signed)
Per EMS, pt from [facility] with possible pneumothorax and pneumonia. Recent [facility] at [facility], 101.4 oral temp at facility yesterday, diminished LS. A&O x 4, SpO2 95% room air. Pt denies shortness of breath.

## 2017-11-24 NOTE — ED Notes (Signed)
Pt is requesting pain medication. Will speak with md.

## 2017-11-24 NOTE — Progress Notes (Signed)
Patient seen and examined at bedside, patient admitted after midnight, please see earlier detailed admission note by Vianne Bulls, MD. Briefly, patient presented with fever concerning for HCAP. On empiric antibiotics. Blood/urine cultures pending. Elevated AST/ALT and Alk phos so will order abdominal ultrasound. Will also order MRSA screen.   Cordelia Poche, MD [facility] 11/24/2017, 12:08 PM Pager: [phone]

## 2017-11-24 NOTE — ED Notes (Signed)
Fathers name is [name] [phone].

## 2017-11-24 NOTE — H&P (Signed)
History and Physical    [name] SlickerJudith Awan ZOX:[id number] DOB: 05/28/1976 DOA: 11/24/2017  [facility]   [facility]   Chief Complaint: Fever, SOB   HPI: [name] [name] is a 41 y.o. female with medical history significant for cerebral palsy with spastic quadriplegia, hypertension, depression with anxiety, chronic pain syndrome, seizure disorder, and recent cervical and upper thoracic decompression and fusion, now presenting to the emergency department from her SNF for evaluation of fevers and dyspnea.  Patient underwent surgery on 11/17/2017 on her cervical and upper thoracic spine, had been recovering as expected and until she began to complain of some shortness of breath with productive cough and was noted to have a fever tonight.  Radiographs at her facility were concerning for pneumonia and a possible pneumothorax.  She was transported to the hospital for further evaluation of this.  ED Course: Upon arrival to the ED, patient is found to have a temp of 37.6 C, tachypneic to 30, saturating mid 90s on room air, and with stable blood pressure and normal heart rate.  EKG features normal sinus rhythm.  Chest x-ray demonstrates shallow inspiration and perihilar streaky densities suspected to be atelectasis or possibly infiltrate.  Chemistry panel is notable for mild elevations in transaminases and CBC features a stable ascitic anemia with hemoglobin of 10.1.  Lactic acid is reassuring at 0.7.  Blood and urine cultures were collected, 30 cc/kg normal saline bolus was administered, and the patient was treated with empiric vancomycin and Zosyn.  She remained hemodynamically stable in the ED, has not been in any apparent respiratory distress, and will be a unit for ongoing evaluation and management of fever and dyspnea with suspected HCAP.   Review of Systems:  All other systems reviewed and apart from HPI, are negative.  Past Medical History:  Diagnosis Date  . Abdominal pain  01/02/2012   Overview:  Overview:  Per Previous pcp note- Dr. Ta--Pt has history of endometriosis and had DUB in 2011.  She She had u/s that showed normal endometrial stripe.  She had endometrial bx that was wnl.  She has a lot of abd cramping every month.  This cramping was sometimes not relieved with hydrocodone.  She was [facility] to [facility] for IUD placement to help endometriosis and abd cramping.  [name]  . Abdominal pain 01/02/2012   Overview:  Overview:  Per Previous pcp note- Dr. Ta--Pt has history of endometriosis and had DUB in 2011.  She She had u/s that showed normal endometrial stripe.  She had endometrial bx that was wnl.  She has a lot of abd cramping every month.  This cramping was sometimes not relieved with hydrocodone.  She was referred to [facility] for IUD placement to help endometriosis and abd cramping.  [name] was placed 04/30/11 by [facility].  Pt still complains of abd cramping, which I am treating with hydrocodone and ultram. Pt reports some initial relief of pain from IUD, but states it has now returned back to similar to previous cramping. Gave patient a shot of Toradol 60 mg IM today-since positive exacerbation of abdominal cramping during past 2 weeks. Physical exam reassuring. [facility] since she is plugged in with the GYN providers there for further treatment options regarding endometriosis and abdominal cramping. The dysfunctional uterine bleeding now well contr  . Acute cystitis with hematuria   . Acute cystitis without hematuria   . Acute respiratory failure with hypoxia ([name])   .  Anemia   . Cellulitis 12/22/2014  . Cerebral palsy ([name])    Spastic Cerebral palsy, mentally intact  . Contracture, joint, multiple sites    Electric wheelchair, uses left hand to operate chair.   . Depression   . Disease of female genital organs 10/24/2010   Overview:  Overview:  Pt has history of endometriosis and had DUB in 2011.  She had  u/s that showed normal endometrial stripe.  She had endometrial bx that was wnl.  She has a lot of abd cramping every month.  This cramping was sometimes not relieved with hydrocodone.  She was referred to [facility] for IUD placement to help endometriosis and abd cramping.  [name] was placed 04/30/11 by [facility].  Pt still complains of abd cramping, which was treated with hydrocodone and ultram.   Last seen at [facility] by OB/GYN 12/24/11, given Depo Lupron injection.  Ordering CT scan to r/o additional cause of abdominal pain (pt with known endometriosis and will not tolerate vaginal U/S, pain not improved s/p [name] or with Lupron injection-- only caused hot flashes and amenorrhea, no pain relief).  Also made referral to pain clinic for better pain control.  Do not think hysterectomy would help with pain, and concern for contamination of baclofen pump if did proceed with surgery.    8/5/13[name]: Pt was seen by   . [name]   . Dysarthria   . Dyspnea and respiratory abnormality 02/04/2011   Overview:  Overview:  Pt has history of recurrent PE and is on chronic coumadin.  Pt has been complaining of dyspnea that she describes as chest tightness.  She has been to the ER multiple times for this.  Usually she gets CTA and serial CE.  She has had at least 6 CTA in a period of since 2011.  She also has an O2 requirement. It is unclear why pt feels dyspneic, complains of chest tightness, or has O2 requirement.  I thought that her complaint may be from deconditioning and referred her to PT.  Unfortunately Medicaid only pays for 4 PT visits.  Pt should be doing these exercises at home, but she has not.  I have referred her to [facility], and she has been seen by [name].  He did not understand the etiology of her dyspnea and O2 requirement.  He will continue to follow her.   Last Assessment & Plan:  Pt presents with concerns for chest tightness and dyspnea.  She was seen in ED yesterday and  discharged home.  While there she had a CT chest 04/20/11 showed 1.  Multifocal degradation as detailed above.  N  . Endometriosis   . GERD (gastroesophageal reflux disease)   . Gram positive sepsis ([name]) 10/24/2014  . Gross hematuria 03/12/2013   Overview:  Overview:  status post replacement of suprapubic tube 03/04/2013  Last Assessment & Plan:  Recent urine culture collected in ED on 5/4 did not result in significant growth. Moreover, patient has been asymptomatic with normal WBC, no fever and no true symptoms making symptomatic UTI less likely.  Will continue to monitor for any sign of infection.  Patient's xarelto had been held during her recent hospitalization due to hematuria. This has since been re-started. Monitor bleeding.    [name] Kitchen HCAP (healthcare-associated pneumonia) 10/25/2014  . History of anticoagulant therapy 10/21/2012  . History of endometriosis 10/2012   OBGYN [facility]: Laparoscopic Endometrial Ablation, TVH,  . History of recurrent UTIs   . Hypertension   .  HYPERTENSION, BENIGN 01/31/2010   Qualifier: Diagnosis of  By: Gala RomneyBensimhon, MD, Trixie DredgeFACC, Daniel R   . Incontinent of feces 03/11/2016  . Infantile cerebral palsy ([name]) 01/29/2007   Overview:  She has spastic CP.  She is mentally intact.  She is wheelchair bound.  She is wheelchair bound for ambulation      . Intracervical pessary 05/07/2011   Overview:  Overview:  Pl[facility] 04/30/11 to treat DUB and Endometriosis.  [facility] but was considered a poor surgical candidate and [facility].  For DUB she had pelvic ultrasound that showed thin stripe and she had endometrial Bx by Dr Jennette KettleNeal in GYN clinic, which was negative.     . Macroscopic hematuria 03/10/2013   status post replacement of suprapubic tube 03/04/2013   . Migraine   . OCD (obsessive compulsive disorder)   . Parapneumonic effusion 10/25/2014  . Presence of intrathecal baclofen pump 03/07/2014   R LQ. Insertion T10.    [name] Kitchen. Psychiatric  illness 10/31/2014  . Pulmonary embolism ([name])    Lifetime Coumadin  . Pulmonary embolism ([name]) 2011, 01/2011   Will be on lifetime coumadin  . Recurrent pulmonary embolism ([name]) 05/03/2011   Pt has history of recurrent PE and is on chronic coumadin.  Pt has been complaining of dyspnea that she describes as chest tightness.  She has been to the ER multiple times for this.  Usually she gets CTA and serial CE.  She has had at least 6 CTA in a period of since 2011.  She also has an O2 requirement. It is unclear why pt feels dyspneic, complains of chest tightness, or has O2 requirement.  I thought that her complaint may be from deconditioning and referred her to PT.  Unfortunately Medicaid only pays for 4 PT visits.  Pt should be doing these exercises at home, but she has not.  [facility], and she has been seen by Dr Marchelle Gearingamaswamy.  He did not understand the etiology of her dyspnea and O2 requirement.  He will continue to follow her.     . Seborrheic keratosis 01/18/2011  . Seizures ([name]) 02/12/2016  . Spasticity 01/05/2014  . Transient alteration of awareness, recurrent 09/08/2006   Recurrent episodes where [name]  loses contact with her world and that have been studied thoroughly and appear nonepileptic in nature per Dr Terrace ArabiaYan (neurology) The patient has had episodes starting in 2007 that initially began 1-4 times a day during which time she would have periods of unawareness followed by confusion. This continuous video EEG monitoring performed from October 8-12, 2007. Had   . Urethra dilated and patulous 07/03/2015   Patulous, Dilated Urethra. 5mL balloon on a 22-Fr catheter hoping this would prevent the bladder spasm from pushing the balloon down the urethra (Dr Logan BoresEvans, Urology [facility], July 2016)     Past Surgical History:  Procedure Laterality Date  . ABDOMINAL HYSTERECTOMY    . APPENDECTOMY    . BACLOFEN PUMP REFILL     x 3 times  . CARPAL TUNNEL RELEASE  08/2008   Dr Teressa SenterSypher  . CESAREAN  SECTION     x 2  . CHOLECYSTECTOMY  10/14/2015   Procedure: LAPAROSCOPIC CHOLECYSTECTOMY;  Surgeon: Violeta GelinasBurke Thompson, MD;  [facility];  Service: General;;  . COLPOSCOPY  06/2000  . INTRAUTERINE DEVICE INSERTION  04/30/11   [facility] for endometriosis  . laparoscopically assisted Vag  Hysterectomy  10/27/2012  . MULTIPLE EXTRACTIONS WITH ALVEOLOPLASTY Bilateral 07/19/2013   Procedure: EXTRACTIONS #4, 1,61,09,60;  Surgeon: Francene Finders, DDS;  Location: [facility];  Service: Oral Surgery;  Laterality: Bilateral;  . NEUROMA SURGERY Left    anterior and posterior intersosseous  . PAIN PUMP IMPLANTATION N/A 03/07/2014   Procedure: baclofen pump revision/replacement and Catheter connection replacement;  Surgeon: Cristi Loron, MD;  Location: [facility];  Service: Neurosurgery;  Laterality: N/A;  baclofen pump revision/replacement and Catheter connection replacement  . PROGRAMABLE BACLOFEN PUMP REVISION  03/17/14   Battery Replacement   . TUBAL LIGATION  2003  . URETHRA SURGERY    . WRIST SURGERY  06/2010   Dr Dierdre Searles, hand surgeon, [name]     reports that  has never smoked. she has never used smokeless tobacco. She reports that she drinks about 0.6 oz of alcohol per week. She reports that she does not use drugs.  Allergies  Allergen Reactions  . Morphine Dermatitis and Other (See Comments)    Skin turned red    Family History  Problem Relation Age of Onset  . Asthma Father   . Colon cancer Maternal Grandmother        Died in her 79's  . Breast cancer Paternal Grandmother        Died in her 66's  . Heart attack Maternal Grandfather        Died in his 80's  . Alzheimer's disease Paternal Grandfather        Died in his 68's  . Breast cancer Mother   . Stomach cancer Neg Hx      Prior to Admission medications   Medication Sig Start Date End Date Taking? Authorizing Provider  acetaminophen (TYLENOL) 500 MG tablet Take 500-650 mg by mouth See admin instructions.  500mg  three times daily and 650 three times daily as needed for pain (do not exceed 4000mg  in 24hrs)   Yes [provider]  amantadine (SYMMETREL) 100 MG capsule Take 100 mg by mouth daily.    Yes [provider]  antiseptic oral rinse (BIOTENE) LIQD 15 mLs by Mouth Rinse route as needed for dry mouth.   Yes [provider]  baclofen (LIORESAL) 20 MG tablet Take 20 mg by mouth 4 (four) times daily. For bladder spasms   Yes [provider]  bisacodyl (DULCOLAX) 10 MG suppository Place 10 mg rectally as needed for moderate constipation.   Yes [provider]  clonazePAM (KLONOPIN) 0.5 MG tablet Take 0.75 mg by mouth 3 (three) times daily.    Yes [provider]  fentaNYL (DURAGESIC - DOSED MCG/HR) 12 MCG/HR Place 12.5 mcg onto the skin every 3 (three) days.   Yes [provider]  FLUoxetine HCl 60 MG TABS Take 60 mg by mouth daily.    Yes [provider]  HYDROmorphone (DILAUDID) 2 MG tablet Take 2 mg by mouth every 4 (four) hours as needed for severe pain.   Yes [provider]  lactose free nutrition (BOOST) LIQD Take 237 mLs by mouth 2 (two) times daily between meals.   Yes [provider]  lamoTRIgine (LAMICTAL) 100 MG tablet Take 100 mg by mouth at bedtime.   Yes [provider]  lubiprostone (AMITIZA) 24 MCG capsule Take 1 capsule (24 mcg total) by mouth 2 (two) times daily with a meal. 01/01/17  Yes Esterwood, [name] S, PA-C  magic mouthwash SOLN Take 10 mLs by mouth 3 (three) times daily.  Yes [provider]  Melatonin 5 MG TABS Take 5 mg by mouth at bedtime.    Yes [provider]  Multiple Vitamins-Minerals (CERTA-VITE PO) Take 1 tablet by mouth daily.   Yes [provider]  oxybutynin (DITROPAN) 5 MG tablet Take 5 mg by mouth 4 (four) times daily.  02/20/16  Yes [provider]  oxyCODONE (ROXICODONE) 5 MG immediate release tablet Take one tablet by mouth every  4 hours as needed for pain Patient taking differently: Take 5-10 mg by mouth 3 (three) times daily as needed. 5mg  three times daily as needed for pain 1-5 out of 10 10mg  three times daily as needed for pain 6-10 out of 10 07/10/16  Yes Kimber Relic, MD  polyethylene glycol powder (GLYCOLAX/MIRALAX) powder Take 1  1/2 dose  daily in 12 ounces of fluid every day. Patient taking differently: Take 17 g by mouth 2 (two) times daily. Mix with 4-8 oz of liquid daily 01/01/17  Yes Esterwood, [name] S, PA-C  QUEtiapine (SEROQUEL) 100 MG tablet Take 1 tablet (100 mg total) by mouth at bedtime. Patient taking differently: Take 125 mg by mouth at bedtime.  06/21/15  Yes Tyrone Nine, MD  tiZANidine (ZANAFLEX) 4 MG capsule Take 4 mg by mouth 2 (two) times daily.    Yes [provider]  warfarin (COUMADIN) 2 MG tablet Take 5 mg by mouth daily at 6 PM.    Yes [provider]    Physical Exam: Vitals:   11/24/17 0215 11/24/17 0230 11/24/17 0245 11/24/17 0300  BP: (!) 114/50 125/63 121/68 135/86  Pulse: 86 89 87 (!) 101  Resp: (!) 23 (!) 29 (!) 9 (!) 32  Temp:      TempSrc:      SpO2: 94% 97% 96% 100%      Constitutional: Not in acute respiratory distress, in apparent discomfort  Eyes: PERTLA, lids and conjunctivae normal ENMT: Mucous membranes are moist. Posterior pharynx clear of any exudate or lesions.   Neck: supple. Anterior and posterior surgical incisions well-approximated without significant erythema, edema, or tenderness, and no drainage.  Respiratory: Tachypnea, mild dyspnea with speech. No accessory muscle use.  Cardiovascular: S1 & S2 heard, regular rate and rhythm. No significant JVD. Abdomen: No distension, no tenderness, no masses palpated. Bowel sounds active.  Musculoskeletal: no clubbing / cyanosis. Contracted.  Skin: no significant rashes, lesions, ulcers. Warm, dry, well-perfused. Neurologic: Spastic quadriplegia. PERRL, EOMI. Sensation intact.   Psychiatric: Alert  and oriented x 3. Calm, cooperative.     Labs on Admission: I have personally reviewed following labs and imaging studies  CBC: Recent Labs  Lab 11/24/17 0120  WBC 8.0  NEUTROABS 5.1  HGB 10.1*  HCT 29.8*  MCV 92.5  PLT 309   Basic Metabolic Panel: Recent Labs  Lab 11/24/17 0120  NA 136  K 4.1  CL 101  CO2 28  GLUCOSE 144*  BUN 7  CREATININE 0.50  CALCIUM 8.6*   GFR: CrCl cannot be calculated (Unknown ideal weight.). Liver Function Tests: Recent Labs  Lab 11/24/17 0120  AST 47*  ALT 94*  ALKPHOS 198*  BILITOT 0.7  PROT 6.2*  ALBUMIN 3.0*   No results for input(s): LIPASE, AMYLASE in the last 168 hours. No results for input(s): AMMONIA in the last 168 hours. Coagulation Profile: Recent Labs  Lab 11/24/17 0120  INR 1.06   Cardiac Enzymes: No results for input(s): CKTOTAL, CKMB, CKMBINDEX, TROPONINI in the last 168 hours. BNP (last  3 results) No results for input(s): PROBNP in the last 8760 hours. HbA1C: No results for input(s): HGBA1C in the last 72 hours. CBG: No results for input(s): GLUCAP in the last 168 hours. Lipid Profile: No results for input(s): CHOL, HDL, LDLCALC, TRIG, CHOLHDL, LDLDIRECT in the last 72 hours. Thyroid Function Tests: No results for input(s): TSH, T4TOTAL, FREET4, T3FREE, THYROIDAB in the last 72 hours. Anemia Panel: No results for input(s): VITAMINB12, FOLATE, FERRITIN, TIBC, IRON, RETICCTPCT in the last 72 hours. Urine analysis:    Component Value Date/Time   COLORURINE AMBER (A) 11/24/2017 0134   APPEARANCEUR CLOUDY (A) 11/24/2017 0134   LABSPEC 1.016 11/24/2017 0134   PHURINE 9.0 (H) 11/24/2017 0134   GLUCOSEU NEGATIVE 11/24/2017 0134   HGBUR NEGATIVE 11/24/2017 0134   HGBUR large 10/03/2010 1530   BILIRUBINUR NEGATIVE 11/24/2017 0134   BILIRUBINUR neg 03/15/2011 1719   KETONESUR NEGATIVE 11/24/2017 0134   PROTEINUR 100 (A) 11/24/2017 0134   UROBILINOGEN 1.0 10/08/2015 2348   NITRITE NEGATIVE 11/24/2017 0134     LEUKOCYTESUR LARGE (A) 11/24/2017 0134   LEUKOCYTESUR mod 01/26/2014   Sepsis Labs: @LABRCNTIP (procalcitonin:4,lacticidven:4) )No results found for this or any previous visit (from the past 240 hour(s)).   Radiological Exams on Admission: Dg Chest Port 1 View  Result Date: 11/24/2017 CLINICAL DATA:  41 year old female with fever and sepsis. EXAM: PORTABLE CHEST 1 VIEW COMPARISON:  CT dated 10/27/2015 FINDINGS: There is shallow inspiration with minimal bibasilar atelectasis. Perihilar streaky densities, likely atelectatic changes or vascular prominence. Developing infiltrate is less likely but not excluded. Clinical correlation is recommended. No focal consolidation, pleural effusion, or pneumothorax. The cardiac silhouette is within normal limits. Air in the right upper abdomen and under the right hemidiaphragm noted which is likely within the colon. There is colonic loop in the right upper abdomen with probable interposition of the colon between the liver and anterior peritoneal wall. Pneumoperitoneum is much less likely. Clinical correlation is recommended. Extensive postsurgical changes and fixation of the cervical and upper thoracic spine noted. Right upper quadrant cholecystectomy clips and an IVC filter is seen. A spinal stimulator noted with tip at the level of T10 vertebra. IMPRESSION: 1. Shallow inspiration. Perihilar streaky densities, likely atelectatic changes or related to vascular prominence. Developing infiltrate is less likely but not excluded. Clinical correlation is recommended. 2. Air density under the right hemidiaphragm appears within the colon. Electronically Signed   By: Elgie Collard M.D.   On: 11/24/2017 01:51    EKG: Independently reviewed. Sinus rhythm.   Assessment/Plan  1. HCAP  - Pt presents from nursing facility with dyspnea, fever, and CXR at facility with question of PNA and PTX  - She is not febrile in ED, no leukocytosis present, but tachypneic and with  perihilar opacities on CXR concerning for atelectasis vs PNA; there is no PTX  - Blood cultures were collected in ED, 30 cc/kg NS bolus given, and she was started on empiric vancomycin and Zosyn  - Plan to add sputum culture and strep pneumo antigen, trend procalcitonin, start incentive spirometry, continue empiric abx with vancomycin and cefepime   2. Chronic neck pain  - Pt has chronic neck pain s/p cervical and upper thoracic decompression and fusion on 12/17  - Continue baclofen, fentanyl patch, prn oral Dilaudid    3. Cerebral palsy, spastic  - Continue baclofen, amantadine    4. Depression, anxiety  - Stable - Continue Klonopin and fluoxetine    5. Anemia  - Hgb is 10.1 on  admission; improved from time of recent discharge  - No bleeding evident    6. Hx of recurrent PE  - No evidence for acute VTE  - She has IVC filter in place - Had been on warfarin, but asked to hold until 12/27 following the spinal surgery on 12/17     DVT prophylaxis: SCD's  Code Status: Full  Family Communication: Father updated by phone with patient's permission  Disposition Plan: Admit to med-surg Consults called: None Admission status: Inpatient    Briscoe Deutscher, MD [facility] Pager [phone]  If 7PM-7AM, please contact night-coverage [email or web address] Password [email or web address]  11/24/2017, 3:15 AM

## 2017-11-24 NOTE — ED Notes (Signed)
EMS reported that pt had 2mg  Dilaudid before leaving the home.

## 2017-11-25 ENCOUNTER — Inpatient Hospital Stay (HOSPITAL_COMMUNITY): Payer: Medicare Other

## 2017-11-25 DIAGNOSIS — Z9359 Other cystostomy status: Secondary | ICD-10-CM

## 2017-11-25 DIAGNOSIS — R1011 Right upper quadrant pain: Secondary | ICD-10-CM

## 2017-11-25 DIAGNOSIS — R569 Unspecified convulsions: Secondary | ICD-10-CM

## 2017-11-25 DIAGNOSIS — R748 Abnormal levels of other serum enzymes: Secondary | ICD-10-CM

## 2017-11-25 LAB — COMPREHENSIVE METABOLIC PANEL
ALBUMIN: 2.6 g/dL — AB (ref 3.5–5.0)
ALT: 73 U/L — AB (ref 14–54)
AST: 49 U/L — AB (ref 15–41)
Alkaline Phosphatase: 138 U/L — ABNORMAL HIGH (ref 38–126)
Anion gap: 5 (ref 5–15)
BILIRUBIN TOTAL: 0.6 mg/dL (ref 0.3–1.2)
BUN: 6 mg/dL (ref 6–20)
CHLORIDE: 107 mmol/L (ref 101–111)
CO2: 26 mmol/L (ref 22–32)
CREATININE: 0.4 mg/dL — AB (ref 0.44–1.00)
Calcium: 8.1 mg/dL — ABNORMAL LOW (ref 8.9–10.3)
GFR calc Af Amer: >60 mL/min (ref >60)
GLUCOSE: 82 mg/dL (ref 65–99)
POTASSIUM: 3.9 mmol/L (ref 3.5–5.1)
Sodium: 138 mmol/L (ref 135–145)
TOTAL PROTEIN: 5.3 g/dL — AB (ref 6.5–8.1)

## 2017-11-25 LAB — VANCOMYCIN, TROUGH: Vancomycin Tr: 12 ug/mL — ABNORMAL LOW (ref 15–20)

## 2017-11-25 LAB — PROCALCITONIN: Procalcitonin: <0.1 ng/mL

## 2017-11-25 MED ORDER — SODIUM CHLORIDE 0.9 % IV BOLUS (SEPSIS)
500.0000 mL | Freq: Once | INTRAVENOUS | Status: AC
  Administered 2017-11-25: 500 mL via INTRAVENOUS

## 2017-11-25 NOTE — Progress Notes (Signed)
PROGRESS NOTE    [name] SlickerJudith [name]  [id number] DOB: [birth year] DOA: 11/24/2017 [facility]   Brief Narrative: [name] SlickerJudith [name] is a 41 y.o. female with medical history significant for cerebral palsy with spastic quadriplegia, hypertension, depression with anxiety, chronic pain syndrome, seizure disorder, and recent cervical and upper thoracic decompression and fusion. She presented secondary to fevers and dyspnea.    Assessment & Plan:   Principal Problem:   HCAP (healthcare-associated pneumonia) Active Problems:   Major depressive disorder, recurrent episode ([name])   CP (cerebral palsy), spastic ([name])   Chronic suprapubic catheter ([name])   Recurrent pulmonary embolism ([name])   Anxiety state   Chronic pain syndrome   HYPERTENSION, BENIGN   Anemia of chronic disease   Seizures ([name])   Fever Dyspnea Initially treated as possible pneumonia, however, WBC normal, afebrile here (has been on antibiotics) and chest x-ray does not suggest pneumonia. Pain in her right chest that is not reproducible. On room air and tachycardia has resolved. She has a history of recurrent PE and has had her warfarin held secondary to recent spinal surgery. She is s/p IVC filter. Urine culture significant for proteus mirabilis infection -continue empiric antibiotics  Chronic neck pain Patient is s/p cervical/thoracic decompression and fusion. Patient is on a lot of pain medication as an outpatient. -Continue baclofen, fentanyl patch, dilaudid  Cerebral palsy -Continue baclofen and amantadine  Depression Anxiety Stable -Continue Klonopin and fluoxetine  Anemia Stable. No bleeding.  History of recurrent PE Warfarin held as mentioned above. S/p IVC filter.  Essential hypertension She has some hypotension overnight but is better this morning. She is not on treatment for hypertension.  RUQ pain Evidence of air on x-ray but thought to be in the bowel. Urine culture positive for proteus  mirabilis -KUB  Urinary tract infection Fever and tachycardia on admission. Treating empirically. -urine culture sensitivities pending  DVT prophylaxis: SCDs Code Status: Full code Family Communication: None at bedside Disposition Plan: When medically stable, back to SNF   Consultants:   None  Procedures:   None  Antimicrobials:  Vancomycin  Cefepime    Subjective: Neck pain. Right sided pain as well.  Objective: Vitals:   11/25/17 0315 11/25/17 0451 11/25/17 0822 11/25/17 1019  BP: (!) 80/40 (!) 84/47 (!) 99/55 116/81  Pulse:  93 88 98  Resp:  18  20  Temp:  98.3 F (36.8 C)  98.5 F (36.9 C)  TempSrc:  Oral  Oral  SpO2:    95%  Weight:      Height: 4\' 5"  (1.346 m)       Intake/Output Summary (Last 24 hours) at 11/25/2017 1156 Last data filed at 11/25/2017 1104 Gross per 24 hour  Intake 1463.33 ml  Output 2000 ml  Net -536.67 ml   Filed Weights   11/24/17 0533  Weight: 49.2 kg (108 lb 7.5 oz)    Examination:  General exam: Appears calm and comfortable. Respiratory system: Diminished but clear. Respiratory effort decreased. Cardiovascular system: S1 & S2 heard, RRR. No murmurs. Gastrointestinal system: Abdomen is nondistended, soft and nontender. No organomegaly or masses felt. Normal bowel sounds heard. Central nervous system: Alert and oriented. No focal neurological deficits. Extremities: No edema. No calf tenderness. Multiple contractures Skin: No cyanosis. No rashes Psychiatry: Judgement and insight appear normal. Flat affect   Data Reviewed: I have personally reviewed following labs and imaging studies  CBC: Recent Labs  Lab 11/24/17 0120  WBC 8.0  NEUTROABS 5.1  HGB 10.1*  HCT 29.8*  MCV 92.5  PLT 309   Basic Metabolic Panel: Recent Labs  Lab 11/24/17 0120 11/25/17 0922  NA 136 138  K 4.1 3.9  CL 101 107  CO2 28 26  GLUCOSE 144* 82  BUN 7 6  CREATININE 0.50 0.40*  CALCIUM 8.6* 8.1*   GFR: Estimated Creatinine  Clearance: 53.4 mL/min (A) (by C-G formula based on SCr of 0.4 mg/dL (L)). Liver Function Tests: Recent Labs  Lab 11/24/17 0120 11/25/17 0922  AST 47* 49*  ALT 94* 73*  ALKPHOS 198* 138*  BILITOT 0.7 0.6  PROT 6.2* 5.3*  ALBUMIN 3.0* 2.6*   No results for input(s): LIPASE, AMYLASE in the last 168 hours. No results for input(s): AMMONIA in the last 168 hours. Coagulation Profile: Recent Labs  Lab 11/24/17 0120  INR 1.06   Cardiac Enzymes: No results for input(s): CKTOTAL, CKMB, CKMBINDEX, TROPONINI in the last 168 hours. BNP (last 3 results) No results for input(s): PROBNP in the last 8760 hours. HbA1C: No results for input(s): HGBA1C in the last 72 hours. CBG: No results for input(s): GLUCAP in the last 168 hours. Lipid Profile: No results for input(s): CHOL, HDL, LDLCALC, TRIG, CHOLHDL, LDLDIRECT in the last 72 hours. Thyroid Function Tests: No results for input(s): TSH, T4TOTAL, FREET4, T3FREE, THYROIDAB in the last 72 hours. Anemia Panel: No results for input(s): VITAMINB12, FOLATE, FERRITIN, TIBC, IRON, RETICCTPCT in the last 72 hours. Sepsis Labs: Recent Labs  Lab 11/24/17 0149 11/24/17 0349 11/25/17 0444  PROCALCITON  --  <0.10 <0.10  LATICACIDVEN 0.71  --   --     Recent Results (from the past 240 hour(s))  Urine culture     Status: Abnormal (Preliminary result)   Collection Time: 11/24/17  1:34 AM  Result Value Ref Range Status   Specimen Description URINE, CATHETERIZED  Final   Special Requests NONE  Final   Culture (A)  Final    >=100,000 COLONIES/mL PROTEUS MIRABILIS SUSCEPTIBILITIES TO FOLLOW    Report Status PENDING  Incomplete  MRSA PCR Screening     Status: None   Collection Time: 11/24/17 12:09 PM  Result Value Ref Range Status   MRSA by PCR NEGATIVE NEGATIVE Final    Comment:        The GeneXpert MRSA Assay (FDA approved for NASAL specimens only), is one component of a comprehensive MRSA colonization surveillance program. It is  not intended to diagnose MRSA infection nor to guide or monitor treatment for MRSA infections.          Radiology Studies: Dg Chest Port 1 View  Result Date: 11/24/2017 CLINICAL DATA:  41 year old female with fever and sepsis. EXAM: PORTABLE CHEST 1 VIEW COMPARISON:  CT dated 10/27/2015 FINDINGS: There is shallow inspiration with minimal bibasilar atelectasis. Perihilar streaky densities, likely atelectatic changes or vascular prominence. Developing infiltrate is less likely but not excluded. Clinical correlation is recommended. No focal consolidation, pleural effusion, or pneumothorax. The cardiac silhouette is within normal limits. Air in the right upper abdomen and under the right hemidiaphragm noted which is likely within the colon. There is colonic loop in the right upper abdomen with probable interposition of the colon between the liver and anterior peritoneal wall. Pneumoperitoneum is much less likely. Clinical correlation is recommended. Extensive postsurgical changes and fixation of the cervical and upper thoracic spine noted. Right upper quadrant cholecystectomy clips and an IVC filter is seen. A spinal stimulator noted with tip at the level of T10 vertebra. IMPRESSION: 1. Shallow inspiration. Perihilar streaky densities,  likely atelectatic changes or related to vascular prominence. Developing infiltrate is less likely but not excluded. Clinical correlation is recommended. 2. Air density under the right hemidiaphragm appears within the colon. Electronically Signed   By: Elgie Collard M.D.   On: 11/24/2017 01:51   US Abdomen Limited Ruq  Result Date: 11/24/2017 CLINICAL DATA:  Elevated liver enzymes. EXAM: ULTRASOUND ABDOMEN LIMITED RIGHT UPPER QUADRANT COMPARISON:  None. FINDINGS: Gallbladder: Surgically absent Common bile duct: Diameter: 2 mm Liver: No focal mass. Portal vein is patent on color Doppler imaging with normal direction of blood flow towards the liver. IMPRESSION: 1. No  cause for elevated liver enzymes identified. The patient is status post cholecystectomy. 2. A small right pleural effusion is identified. Electronically Signed   By: Gerome Sam III M.D   On: 11/24/2017 11:59        Scheduled Meds: . amantadine  100 mg Oral Daily  . baclofen  20 mg Oral QID  . clonazepam  0.75 mg Oral TID  . feeding supplement (ENSURE ENLIVE)  237 mL Oral BID BM  . fentaNYL  12.5 mcg Transdermal Q72H  . FLUoxetine  60 mg Oral Daily  . lactose free nutrition  237 mL Oral BID BM  . lamoTRIgine  100 mg Oral QHS  . lubiprostone  24 mcg Oral BID WC  . magic mouthwash  10 mL Oral TID  . Melatonin  6 mg Oral QHS  . oxybutynin  5 mg Oral QID  . polyethylene glycol  17 g Oral BID  . QUEtiapine  125 mg Oral QHS  . tiZANidine  4 mg Oral BID   Continuous Infusions: . ceFEPime (MAXIPIME) IV Stopped (11/25/17 0606)  . vancomycin Stopped (11/25/17 0824)     LOS: 1 day     Jacquelin Hawking, MD [facility] 11/25/2017, 11:56 AM Pager: [phone]  If 7PM-7AM, please contact night-coverage [email or web address] Password [facility] 11/25/2017, 11:56 AM

## 2017-11-25 NOTE — Progress Notes (Signed)
Pt's BP read 73/38 at 0132, NP Maren ReamerKaren Kirby (on call) paged and notified, rechecked manually, read 80/50 at 0159, ordered iv bolus of N/S 500cc, same commenced at 0200, pt calm and sleeping in bed, will however continue to monitor. Obasogie-Asidi, Kerrilyn Azbill Efe

## 2017-11-25 NOTE — Progress Notes (Signed)
Pt's BP read 80/40 taken manually at 0315 after the bolus, NP Maren ReamerKaren Kirby paged and notified, parameters ordered accordingly for BP management, will continue to monitor. Obasogie-Asidi, Sydney Azure Efe

## 2017-11-26 ENCOUNTER — Inpatient Hospital Stay (HOSPITAL_COMMUNITY): Payer: Medicare Other

## 2017-11-26 LAB — COMPREHENSIVE METABOLIC PANEL
ALT: 67 U/L — AB (ref 14–54)
AST: 36 U/L (ref 15–41)
Albumin: 2.9 g/dL — ABNORMAL LOW (ref 3.5–5.0)
Alkaline Phosphatase: 160 U/L — ABNORMAL HIGH (ref 38–126)
Anion gap: 10 (ref 5–15)
BILIRUBIN TOTAL: 0.6 mg/dL (ref 0.3–1.2)
BUN: 6 mg/dL (ref 6–20)
CO2: 26 mmol/L (ref 22–32)
CREATININE: 0.43 mg/dL — AB (ref 0.44–1.00)
Calcium: 8.7 mg/dL — ABNORMAL LOW (ref 8.9–10.3)
Chloride: 101 mmol/L (ref 101–111)
Glucose, Bld: 92 mg/dL (ref 65–99)
Potassium: 4.2 mmol/L (ref 3.5–5.1)
Sodium: 137 mmol/L (ref 135–145)
TOTAL PROTEIN: 6.3 g/dL — AB (ref 6.5–8.1)

## 2017-11-26 LAB — CBC
HCT: 32.9 % — ABNORMAL LOW (ref 36.0–46.0)
Hemoglobin: 10.4 g/dL — ABNORMAL LOW (ref 12.0–15.0)
MCH: 29.7 pg (ref 26.0–34.0)
MCHC: 31.6 g/dL (ref 30.0–36.0)
MCV: 94 fL (ref 78.0–100.0)
PLATELETS: 431 10*3/uL — AB (ref 150–400)
RBC: 3.5 MIL/uL — AB (ref 3.87–5.11)
RDW: 14.6 % (ref 11.5–15.5)
WBC: 6.8 10*3/uL (ref 4.0–10.5)

## 2017-11-26 MED ORDER — HYDROMORPHONE HCL 1 MG/ML IJ SOLN
1.0000 mg | Freq: Once | INTRAMUSCULAR | Status: AC
  Administered 2017-11-26: 1 mg via INTRAVENOUS
  Filled 2017-11-26: qty 1

## 2017-11-26 MED ORDER — GENERIC EXTERNAL MEDICATION
Status: DC
Start: ? — End: 2017-11-26

## 2017-11-26 MED ORDER — AMOXICILLIN-POT CLAVULANATE 875-125 MG PO TABS
1.0000 | ORAL_TABLET | Freq: Two times a day (BID) | ORAL | Status: DC
  Administered 2017-11-26 – 2017-11-27 (×3): 1 via ORAL
  Filled 2017-11-26 (×3): qty 1

## 2017-11-26 MED ORDER — IOPAMIDOL (ISOVUE-300) INJECTION 61%
INTRAVENOUS | Status: AC
  Administered 2017-11-26: 75 mL
  Filled 2017-11-26: qty 75

## 2017-11-26 NOTE — Social Work (Signed)
CS[facility].  CSW [facility] and [facility].  Keene BreathPatricia Roosevelt Bisher, LCSW Clinical Social Worker [phone]

## 2017-11-26 NOTE — Progress Notes (Signed)
PROGRESS NOTE    [name] SlickerJudith [name]  [id number] DOB: [birth year] DOA: 11/24/2017 PCP: [name] [name]   Brief Narrative: [name] [name] is a 41 y.o. female with medical history significant for cerebral palsy with spastic quadriplegia, hypertension, depression with anxiety, chronic pain syndrome, seizure disorder, and recent cervical and upper thoracic decompression and fusion. She presented secondary to fevers and dyspnea.    Assessment & Plan:   Principal Problem:   HCAP (healthcare-associated pneumonia) Active Problems:   Major depressive disorder, recurrent episode ([name])   CP (cerebral palsy), spastic ([name])   Chronic suprapubic catheter ([name])   Recurrent pulmonary embolism ([name])   Anxiety state   Chronic pain syndrome   HYPERTENSION, BENIGN   Anemia of chronic disease   Seizures ([name])   Fever Dyspnea Initially treated as possible pneumonia, however, WBC normal, afebrile here (has been on antibiotics) and chest x-ray does not suggest pneumonia. Pain in her right chest that is not reproducible. On room air and tachycardia has resolved. She has a history of recurrent PE and has had her warfarin held secondary to recent spinal surgery. She is s/p IVC filter. Urine culture significant for proteus mirabilis infection -continue empiric antibiotics  Chronic neck pain Patient is s/p cervical/thoracic decompression and fusion. Patient is on a lot of pain medication as an outpatient. Pain is worsened per patient. -Continue baclofen, fentanyl patch, dilaudid -CT neck  Cerebral palsy -Continue baclofen and amantadine  Depression Anxiety Stable -Continue Klonopin and fluoxetine  Anemia Stable. No bleeding.  History of recurrent PE Warfarin held as mentioned above. S/p IVC filter.  Essential hypertension Controlled.  RUQ pain Evidence of air on x-ray but thought to be in the bowel. Urine culture positive for proteus mirabilis  Urinary tract infection Fever and tachycardia on  admission. Treating empirically. Urine cultures significant for proteus mirabilis, resistant only to nitrofurantoin. -discontinue cefepime and start Augmentin 875 mg BID to cover for ?oral infection in addition to UTI  Oral pain Patient with very poor dentition. inflamed gums -Augmentin as mentioned above  DVT prophylaxis: SCDs Code Status: Full code Family Communication: None at bedside Disposition Plan: When medically stable, back to SNF   Consultants:   None  Procedures:   None  Antimicrobials:  Zosyn (12/23)  Vancomycin (12/23>>12/24)  Cefepime (12/12>>12/26)  Augmentin (12/26>>   Subjective: Neck pain is bad today. Gum pain.  Objective: Vitals:   11/25/17 2119 11/26/17 0048 11/26/17 0517 11/26/17 1000  BP: 104/72 101/66 99/61 (!) 96/56  Pulse: 93 86 88 85  Resp: 18 18 18 18   Temp: 98.2 F (36.8 C) 98 F (36.7 C) 98.3 F (36.8 C) 98.7 F (37.1 C)  TempSrc: Oral Oral Oral Oral  SpO2: 93% 94% 96% 97%  Weight:      Height:        Intake/Output Summary (Last 24 hours) at 11/26/2017 1405 Last data filed at 11/26/2017 1200 Gross per 24 hour  Intake 1130 ml  Output 1150 ml  Net -20 ml   Filed Weights   11/24/17 0533  Weight: 49.2 kg (108 lb 7.5 oz)    Examination:  General exam: Appears calm and comfortable. Respiratory system: Diminished but clear. Respiratory effort decreased. Cardiovascular system: S1 & S2 heard, RRR. No murmurs. Gastrointestinal system: Abdomen is nondistended, soft and nontender. No organomegaly or masses felt. Normal bowel sounds heard. Central nervous system: Alert and oriented. Extremities: No edema. No calf tenderness. Multiple contractures Skin: No cyanosis. No rashes. Incision on right anterior neck Psychiatry: Judgement and insight appear  normal. Flat affect   Data Reviewed: I have personally reviewed following labs and imaging studies  CBC: Recent Labs  Lab 11/24/17 0120 11/26/17 1039  WBC 8.0 6.8    NEUTROABS 5.1  --   HGB 10.1* 10.4*  HCT 29.8* 32.9*  MCV 92.5 94.0  PLT 309 431*   Basic Metabolic Panel: Recent Labs  Lab 11/24/17 0120 11/25/17 0922 11/26/17 1039  NA 136 138 137  K 4.1 3.9 4.2  CL 101 107 101  CO2 28 26 26   GLUCOSE 144* 82 92  BUN 7 6 6   CREATININE 0.50 0.40* 0.43*  CALCIUM 8.6* 8.1* 8.7*   GFR: Estimated Creatinine Clearance: 53.4 mL/min (A) (by C-G formula based on SCr of 0.43 mg/dL (L)). Liver Function Tests: Recent Labs  Lab 11/24/17 0120 11/25/17 0922 11/26/17 1039  AST 47* 49* 36  ALT 94* 73* 67*  ALKPHOS 198* 138* 160*  BILITOT 0.7 0.6 0.6  PROT 6.2* 5.3* 6.3*  ALBUMIN 3.0* 2.6* 2.9*   No results for input(s): LIPASE, AMYLASE in the last 168 hours. No results for input(s): AMMONIA in the last 168 hours. Coagulation Profile: Recent Labs  Lab 11/24/17 0120  INR 1.06   Cardiac Enzymes: No results for input(s): CKTOTAL, CKMB, CKMBINDEX, TROPONINI in the last 168 hours. BNP (last 3 results) No results for input(s): PROBNP in the last 8760 hours. HbA1C: No results for input(s): HGBA1C in the last 72 hours. CBG: No results for input(s): GLUCAP in the last 168 hours. Lipid Profile: No results for input(s): CHOL, HDL, LDLCALC, TRIG, CHOLHDL, LDLDIRECT in the last 72 hours. Thyroid Function Tests: No results for input(s): TSH, T4TOTAL, FREET4, T3FREE, THYROIDAB in the last 72 hours. Anemia Panel: No results for input(s): VITAMINB12, FOLATE, FERRITIN, TIBC, IRON, RETICCTPCT in the last 72 hours. Sepsis Labs: Recent Labs  Lab 11/24/17 0149 11/24/17 0349 11/25/17 0444  PROCALCITON  --  <0.10 <0.10  LATICACIDVEN 0.71  --   --     Recent Results (from the past 240 hour(s))  Blood Culture (routine x 2)     Status: None (Preliminary result)   Collection Time: 11/24/17  1:20 AM  Result Value Ref Range Status   Specimen Description BLOOD LEFT ANTECUBITAL  Final   Special Requests   Final    BOTTLES DRAWN AEROBIC AND ANAEROBIC Blood  Culture adequate volume   Culture NO GROWTH 1 DAY  Final   Report Status PENDING  Incomplete  Blood Culture (routine x 2)     Status: None (Preliminary result)   Collection Time: 11/24/17  1:30 AM  Result Value Ref Range Status   Specimen Description BLOOD LEFT WRIST  Final   Special Requests   Final    BOTTLES DRAWN AEROBIC AND ANAEROBIC Blood Culture adequate volume   Culture NO GROWTH 1 DAY  Final   Report Status PENDING  Incomplete  Urine culture     Status: Abnormal (Preliminary result)   Collection Time: 11/24/17  1:34 AM  Result Value Ref Range Status   Specimen Description URINE, CATHETERIZED  Final   Special Requests NONE  Final   Culture (A)  Final    >=100,000 COLONIES/mL PROTEUS MIRABILIS CULTURE REINCUBATED FOR BETTER GROWTH    Report Status PENDING  Incomplete   Organism ID, Bacteria PROTEUS MIRABILIS (A)  Final      Susceptibility   Proteus mirabilis - MIC*    AMPICILLIN <=2 SENSITIVE Sensitive     CEFAZOLIN <=4 SENSITIVE Sensitive     CEFTRIAXONE <=  1 SENSITIVE Sensitive     CIPROFLOXACIN <=0.25 SENSITIVE Sensitive     GENTAMICIN <=1 SENSITIVE Sensitive     IMIPENEM 2 SENSITIVE Sensitive     NITROFURANTOIN 128 RESISTANT Resistant     TRIMETH/SULFA <=20 SENSITIVE Sensitive     AMPICILLIN/SULBACTAM <=2 SENSITIVE Sensitive     PIP/TAZO <=4 SENSITIVE Sensitive     * >=100,000 COLONIES/mL PROTEUS MIRABILIS  MRSA PCR Screening     Status: None   Collection Time: 11/24/17 12:09 PM  Result Value Ref Range Status   MRSA by PCR NEGATIVE NEGATIVE Final    Comment:        The GeneXpert MRSA Assay (FDA approved for NASAL specimens only), is one component of a comprehensive MRSA colonization surveillance program. It is not intended to diagnose MRSA infection nor to guide or monitor treatment for MRSA infections.          Radiology Studies: Dg Abd 1 View  Result Date: 11/25/2017 CLINICAL DATA:  Right upper quadrant abdominal pain EXAM: ABDOMEN - 1 VIEW  COMPARISON:  CT abdomen 10/27/2015 FINDINGS: Pain pump noted, catheter extending up proximally to the T10 level. IVC filter and right upper quadrant clips noted. Mildly asymmetric sclerosis along the right sacroiliac joint. Upper normal amount of gas in small and large bowel. IMPRESSION: 1. Upper normal amount of gas in the small and large bowel but no overtly dilated small bowel. 2. Pain pump noted. 3. SI joint arthropathy, right greater than left. Electronically Signed   By: Gaylyn RongWalter  Liebkemann M.D.   On: 11/25/2017 12:53        Scheduled Meds: . amantadine  100 mg Oral Daily  . baclofen  20 mg Oral QID  . clonazepam  0.75 mg Oral TID  . feeding supplement (ENSURE ENLIVE)  237 mL Oral BID BM  . fentaNYL  12.5 mcg Transdermal Q72H  . FLUoxetine  60 mg Oral Daily  . lactose free nutrition  237 mL Oral BID BM  . lamoTRIgine  100 mg Oral QHS  . lubiprostone  24 mcg Oral BID WC  . magic mouthwash  10 mL Oral TID  . Melatonin  6 mg Oral QHS  . oxybutynin  5 mg Oral QID  . polyethylene glycol  17 g Oral BID  . QUEtiapine  125 mg Oral QHS  . tiZANidine  4 mg Oral BID   Continuous Infusions: . ceFEPime (MAXIPIME) IV Stopped (11/26/17 0709)     LOS: 2 days     Jacquelin Hawkingalph Jeorge Reister, MD [facility] 11/26/2017, 2:05 PM Pager: [phone]  If 7PM-7AM, please contact night-coverage [email or web address] Password [email or web address] 11/26/2017, 2:05 PM

## 2017-11-26 NOTE — Progress Notes (Signed)
Initial Nutrition Assessment  DOCUMENTATION CODES:   Not applicable  INTERVENTION:  Ensure Enlive po BID, each supplement provides 350 kcal and 20 grams of protein Adjusted patient's likes and dislikes in healthtouch.  NUTRITION DIAGNOSIS:   Inadequate oral intake related to poor appetite as evidenced by per patient/family report.  GOAL:   Patient will meet greater than or equal to 90% of their needs  MONITOR:   PO intake, I & O's, Labs, Weight trends, Supplement acceptance  REASON FOR ASSESSMENT:   Malnutrition Screening Tool    ASSESSMENT:   [name] SlickerJudith [name] is a 41 y.o. female with medical history significant for cerebral palsy with spastic quadriplegia, hypertension, depression with anxiety, chronic pain syndrome, seizure disorder, and recent cervical and upper thoracic decompression and fusion. She presented secondary to fevers and dyspnea.   Spoke with patient and patient's nurse tech. Per nurse tech, she ate eggs, orange juice ,fruit, and grits for breakfast. Patient unable to provide much history but states she lost weight from 138 pounds to 108 pounds over the past year. Per chart seems she has fluctuated between 107 pounds to 98 pounds to 108 pounds since Jan 31st.  Had lunch at bedside but was not hungry, states she is in a lot of pain. Also reports mouth sores but states she is able to eat and drink regardless. Orthopantogram ordered. Unable to provide much other history, but did have multiple food preferences she wanted RD to take care of. Listened attentively and made sure patient's needs were taken care of. She has trouble speaking clearly, but was grateful afterwards.  Labs reviewed Medications reviewed and include:  Miralax   Intake/Output Summary (Last 24 hours) at 11/26/2017 1515 Last data filed at 11/26/2017 1200 Gross per 24 hour  Intake 1130 ml  Output 1150 ml  Net -20 ml     NUTRITION - FOCUSED PHYSICAL EXAM:  Unable to utilize due to spastic  quadriplegia and cerebral palsy  Diet Order:  Diet regular Room service appropriate? Yes; Fluid consistency: Thin  EDUCATION NEEDS:   Education needs have been addressed  Skin:  Skin Assessment: Reviewed RN Assessment  Last BM:  12/24 Type 7  Height:   Ht Readings from Last 1 Encounters:  11/25/17 4\' 5"  (1.346 m)  Used 4\' 10"  for calculations. Per chart review appears patient has previously been listed between 4\' 10"  - 5'  Weight:   Wt Readings from Last 1 Encounters:  11/24/17 108 lb 7.5 oz (49.2 kg)    Ideal Body Weight:  43.93 kg  BMI:  Body mass index is 27.15 kg/m.  Estimated Nutritional Needs:   Kcal:  1250-1500 calories  Protein:  68-79 grams (1.4-1.6g/kg)  Fluid:  >1.5L  Dionne AnoWilliam M. Yumalay Circle, MS, RD LDN Inpatient Clinical Dietitian Pager [phone]

## 2017-11-26 NOTE — Progress Notes (Signed)
Patient's father reports that [facility] are named Dr. Lorenso CourierPowers and Dr. Anne HahnWillis. [name] RadarHeather M Balinda [name]

## 2017-11-27 DIAGNOSIS — M4180 Other forms of scoliosis, site unspecified: Secondary | ICD-10-CM | POA: Diagnosis not present

## 2017-11-27 DIAGNOSIS — R740 Nonspecific elevation of levels of transaminase and lactic acid dehydrogenase [LDH]: Secondary | ICD-10-CM

## 2017-11-27 DIAGNOSIS — G4700 Insomnia, unspecified: Secondary | ICD-10-CM | POA: Diagnosis not present

## 2017-11-27 DIAGNOSIS — F329 Major depressive disorder, single episode, unspecified: Secondary | ICD-10-CM | POA: Diagnosis not present

## 2017-11-27 DIAGNOSIS — K219 Gastro-esophageal reflux disease without esophagitis: Secondary | ICD-10-CM | POA: Diagnosis not present

## 2017-11-27 DIAGNOSIS — R110 Nausea: Secondary | ICD-10-CM | POA: Diagnosis not present

## 2017-11-27 DIAGNOSIS — M542 Cervicalgia: Secondary | ICD-10-CM | POA: Diagnosis not present

## 2017-11-27 DIAGNOSIS — M4143 Neuromuscular scoliosis, cervicothoracic region: Secondary | ICD-10-CM | POA: Diagnosis not present

## 2017-11-27 DIAGNOSIS — T17320A Food in larynx causing asphyxiation, initial encounter: Secondary | ICD-10-CM | POA: Diagnosis not present

## 2017-11-27 DIAGNOSIS — R471 Dysarthria and anarthria: Secondary | ICD-10-CM | POA: Diagnosis not present

## 2017-11-27 DIAGNOSIS — N898 Other specified noninflammatory disorders of vagina: Secondary | ICD-10-CM | POA: Diagnosis not present

## 2017-11-27 DIAGNOSIS — F429 Obsessive-compulsive disorder, unspecified: Secondary | ICD-10-CM | POA: Diagnosis not present

## 2017-11-27 DIAGNOSIS — T83198A Other mechanical complication of other urinary devices and implants, initial encounter: Secondary | ICD-10-CM | POA: Diagnosis not present

## 2017-11-27 DIAGNOSIS — Y732 Prosthetic and other implants, materials and accessory gastroenterology and urology devices associated with adverse incidents: Secondary | ICD-10-CM | POA: Diagnosis not present

## 2017-11-27 DIAGNOSIS — R339 Retention of urine, unspecified: Secondary | ICD-10-CM | POA: Diagnosis present

## 2017-11-27 DIAGNOSIS — D638 Anemia in other chronic diseases classified elsewhere: Secondary | ICD-10-CM | POA: Diagnosis not present

## 2017-11-27 DIAGNOSIS — R1310 Dysphagia, unspecified: Secondary | ICD-10-CM | POA: Diagnosis not present

## 2017-11-27 DIAGNOSIS — J189 Pneumonia, unspecified organism: Secondary | ICD-10-CM | POA: Diagnosis not present

## 2017-11-27 DIAGNOSIS — N390 Urinary tract infection, site not specified: Secondary | ICD-10-CM | POA: Diagnosis not present

## 2017-11-27 DIAGNOSIS — I10 Essential (primary) hypertension: Secondary | ICD-10-CM | POA: Diagnosis not present

## 2017-11-27 DIAGNOSIS — Z86711 Personal history of pulmonary embolism: Secondary | ICD-10-CM | POA: Diagnosis not present

## 2017-11-27 DIAGNOSIS — J188 Other pneumonia, unspecified organism: Secondary | ICD-10-CM | POA: Diagnosis not present

## 2017-11-27 DIAGNOSIS — F418 Other specified anxiety disorders: Secondary | ICD-10-CM | POA: Diagnosis not present

## 2017-11-27 DIAGNOSIS — T83498A Other mechanical complication of other prosthetic devices, implants and grafts of genital tract, initial encounter: Secondary | ICD-10-CM | POA: Diagnosis not present

## 2017-11-27 DIAGNOSIS — Z79899 Other long term (current) drug therapy: Secondary | ICD-10-CM | POA: Diagnosis not present

## 2017-11-27 DIAGNOSIS — M4184 Other forms of scoliosis, thoracic region: Secondary | ICD-10-CM | POA: Diagnosis not present

## 2017-11-27 DIAGNOSIS — Z419 Encounter for procedure for purposes other than remedying health state, unspecified: Secondary | ICD-10-CM | POA: Diagnosis not present

## 2017-11-27 DIAGNOSIS — R569 Unspecified convulsions: Secondary | ICD-10-CM | POA: Diagnosis not present

## 2017-11-27 DIAGNOSIS — F23 Brief psychotic disorder: Secondary | ICD-10-CM | POA: Diagnosis not present

## 2017-11-27 DIAGNOSIS — R634 Abnormal weight loss: Secondary | ICD-10-CM | POA: Diagnosis not present

## 2017-11-27 DIAGNOSIS — R252 Cramp and spasm: Secondary | ICD-10-CM | POA: Diagnosis not present

## 2017-11-27 DIAGNOSIS — N3941 Urge incontinence: Secondary | ICD-10-CM | POA: Diagnosis not present

## 2017-11-27 DIAGNOSIS — R509 Fever, unspecified: Secondary | ICD-10-CM | POA: Diagnosis not present

## 2017-11-27 DIAGNOSIS — R0989 Other specified symptoms and signs involving the circulatory and respiratory systems: Secondary | ICD-10-CM | POA: Diagnosis not present

## 2017-11-27 DIAGNOSIS — M4322 Fusion of spine, cervical region: Secondary | ICD-10-CM | POA: Diagnosis not present

## 2017-11-27 DIAGNOSIS — M4103 Infantile idiopathic scoliosis, cervicothoracic region: Secondary | ICD-10-CM | POA: Diagnosis not present

## 2017-11-27 DIAGNOSIS — G801 Spastic diplegic cerebral palsy: Secondary | ICD-10-CM | POA: Diagnosis not present

## 2017-11-27 DIAGNOSIS — M4182 Other forms of scoliosis, cervical region: Secondary | ICD-10-CM | POA: Diagnosis not present

## 2017-11-27 DIAGNOSIS — R1011 Right upper quadrant pain: Secondary | ICD-10-CM | POA: Diagnosis not present

## 2017-11-27 DIAGNOSIS — G808 Other cerebral palsy: Secondary | ICD-10-CM | POA: Diagnosis not present

## 2017-11-27 DIAGNOSIS — K1379 Other lesions of oral mucosa: Secondary | ICD-10-CM | POA: Diagnosis not present

## 2017-11-27 DIAGNOSIS — R1319 Other dysphagia: Secondary | ICD-10-CM | POA: Diagnosis not present

## 2017-11-27 DIAGNOSIS — K089 Disorder of teeth and supporting structures, unspecified: Secondary | ICD-10-CM

## 2017-11-27 DIAGNOSIS — G800 Spastic quadriplegic cerebral palsy: Secondary | ICD-10-CM | POA: Diagnosis not present

## 2017-11-27 DIAGNOSIS — Z7901 Long term (current) use of anticoagulants: Secondary | ICD-10-CM | POA: Diagnosis not present

## 2017-11-27 DIAGNOSIS — D508 Other iron deficiency anemias: Secondary | ICD-10-CM | POA: Diagnosis not present

## 2017-11-27 DIAGNOSIS — G8929 Other chronic pain: Secondary | ICD-10-CM | POA: Diagnosis not present

## 2017-11-27 DIAGNOSIS — Z4789 Encounter for other orthopedic aftercare: Secondary | ICD-10-CM | POA: Diagnosis not present

## 2017-11-27 DIAGNOSIS — Z9049 Acquired absence of other specified parts of digestive tract: Secondary | ICD-10-CM | POA: Diagnosis not present

## 2017-11-27 DIAGNOSIS — G803 Athetoid cerebral palsy: Secondary | ICD-10-CM | POA: Diagnosis not present

## 2017-11-27 DIAGNOSIS — M4312 Spondylolisthesis, cervical region: Secondary | ICD-10-CM | POA: Diagnosis not present

## 2017-11-27 DIAGNOSIS — M6240 Contracture of muscle, unspecified site: Secondary | ICD-10-CM | POA: Diagnosis not present

## 2017-11-27 DIAGNOSIS — D518 Other vitamin B12 deficiency anemias: Secondary | ICD-10-CM | POA: Diagnosis not present

## 2017-11-27 DIAGNOSIS — M24541 Contracture, right hand: Secondary | ICD-10-CM | POA: Diagnosis not present

## 2017-11-27 DIAGNOSIS — R748 Abnormal levels of other serum enzymes: Secondary | ICD-10-CM | POA: Diagnosis not present

## 2017-11-27 DIAGNOSIS — R1312 Dysphagia, oropharyngeal phase: Secondary | ICD-10-CM | POA: Diagnosis not present

## 2017-11-27 DIAGNOSIS — Z9889 Other specified postprocedural states: Secondary | ICD-10-CM | POA: Diagnosis not present

## 2017-11-27 DIAGNOSIS — J80 Acute respiratory distress syndrome: Secondary | ICD-10-CM | POA: Diagnosis not present

## 2017-11-27 DIAGNOSIS — R482 Apraxia: Secondary | ICD-10-CM | POA: Diagnosis not present

## 2017-11-27 DIAGNOSIS — T83510A Infection and inflammatory reaction due to cystostomy catheter, initial encounter: Secondary | ICD-10-CM | POA: Diagnosis not present

## 2017-11-27 DIAGNOSIS — N9489 Other specified conditions associated with female genital organs and menstrual cycle: Secondary | ICD-10-CM | POA: Diagnosis not present

## 2017-11-27 DIAGNOSIS — R52 Pain, unspecified: Secondary | ICD-10-CM | POA: Diagnosis not present

## 2017-11-27 DIAGNOSIS — R1311 Dysphagia, oral phase: Secondary | ICD-10-CM | POA: Diagnosis not present

## 2017-11-27 DIAGNOSIS — L731 Pseudofolliculitis barbae: Secondary | ICD-10-CM | POA: Diagnosis not present

## 2017-11-27 DIAGNOSIS — R498 Other voice and resonance disorders: Secondary | ICD-10-CM | POA: Diagnosis not present

## 2017-11-27 DIAGNOSIS — Z9689 Presence of other specified functional implants: Secondary | ICD-10-CM | POA: Diagnosis not present

## 2017-11-27 DIAGNOSIS — G894 Chronic pain syndrome: Secondary | ICD-10-CM | POA: Diagnosis not present

## 2017-11-27 DIAGNOSIS — G2581 Restless legs syndrome: Secondary | ICD-10-CM | POA: Diagnosis not present

## 2017-11-27 DIAGNOSIS — Z981 Arthrodesis status: Secondary | ICD-10-CM | POA: Diagnosis not present

## 2017-11-27 DIAGNOSIS — K5909 Other constipation: Secondary | ICD-10-CM | POA: Diagnosis not present

## 2017-11-27 DIAGNOSIS — G809 Cerebral palsy, unspecified: Secondary | ICD-10-CM | POA: Diagnosis not present

## 2017-11-27 DIAGNOSIS — Z885 Allergy status to narcotic agent status: Secondary | ICD-10-CM | POA: Diagnosis not present

## 2017-11-27 DIAGNOSIS — N319 Neuromuscular dysfunction of bladder, unspecified: Secondary | ICD-10-CM | POA: Diagnosis not present

## 2017-11-27 DIAGNOSIS — I2699 Other pulmonary embolism without acute cor pulmonale: Secondary | ICD-10-CM | POA: Diagnosis not present

## 2017-11-27 DIAGNOSIS — Z9359 Other cystostomy status: Secondary | ICD-10-CM | POA: Diagnosis not present

## 2017-11-27 DIAGNOSIS — Z0000 Encounter for general adult medical examination without abnormal findings: Secondary | ICD-10-CM | POA: Diagnosis not present

## 2017-11-27 DIAGNOSIS — M4142 Neuromuscular scoliosis, cervical region: Secondary | ICD-10-CM | POA: Diagnosis not present

## 2017-11-27 DIAGNOSIS — R6889 Other general symptoms and signs: Secondary | ICD-10-CM | POA: Diagnosis not present

## 2017-11-27 DIAGNOSIS — R5383 Other fatigue: Secondary | ICD-10-CM | POA: Diagnosis not present

## 2017-11-27 DIAGNOSIS — M6281 Muscle weakness (generalized): Secondary | ICD-10-CM | POA: Diagnosis not present

## 2017-11-27 DIAGNOSIS — L928 Other granulomatous disorders of the skin and subcutaneous tissue: Secondary | ICD-10-CM | POA: Diagnosis not present

## 2017-11-27 DIAGNOSIS — N318 Other neuromuscular dysfunction of bladder: Secondary | ICD-10-CM | POA: Diagnosis not present

## 2017-11-27 DIAGNOSIS — N809 Endometriosis, unspecified: Secondary | ICD-10-CM | POA: Diagnosis not present

## 2017-11-27 DIAGNOSIS — F422 Mixed obsessional thoughts and acts: Secondary | ICD-10-CM | POA: Diagnosis not present

## 2017-11-27 DIAGNOSIS — M24531 Contracture, right wrist: Secondary | ICD-10-CM | POA: Diagnosis not present

## 2017-11-27 DIAGNOSIS — R10819 Abdominal tenderness, unspecified site: Secondary | ICD-10-CM | POA: Diagnosis not present

## 2017-11-27 DIAGNOSIS — T83010A Breakdown (mechanical) of cystostomy catheter, initial encounter: Secondary | ICD-10-CM | POA: Diagnosis not present

## 2017-11-27 DIAGNOSIS — M2450 Contracture, unspecified joint: Secondary | ICD-10-CM | POA: Diagnosis not present

## 2017-11-27 DIAGNOSIS — K5900 Constipation, unspecified: Secondary | ICD-10-CM | POA: Diagnosis not present

## 2017-11-27 DIAGNOSIS — Z23 Encounter for immunization: Secondary | ICD-10-CM | POA: Diagnosis not present

## 2017-11-27 DIAGNOSIS — N3281 Overactive bladder: Secondary | ICD-10-CM | POA: Diagnosis not present

## 2017-11-27 MED ORDER — FENTANYL 12 MCG/HR TD PT72: 12.5000 ug | MEDICATED_PATCH | TRANSDERMAL | 0 refills | Status: DC

## 2017-11-27 MED ORDER — CLONAZEPAM 0.5 MG PO TABS: 0.7500 mg | ORAL_TABLET | Freq: Three times a day (TID) | ORAL | 0 refills | Status: AC

## 2017-11-27 MED ORDER — AMOXICILLIN-POT CLAVULANATE 875-125 MG PO TABS: 1.0000 | ORAL_TABLET | Freq: Two times a day (BID) | ORAL | Status: AC

## 2017-11-27 MED ORDER — HYDROMORPHONE HCL 2 MG PO TABS: 2.0000 mg | ORAL_TABLET | ORAL | 0 refills | Status: DC | PRN

## 2017-11-27 NOTE — Discharge Summary (Addendum)
Physician Discharge Summary  [name] [name] ZOX:[id number] DOB: 12-29-1975 DOA: 11/24/2017  PCP: Lacinda Axon  Admit date: 11/24/2017 Discharge date: 11/27/2017  Admitted From: SNF Disposition: SNF  Recommendations for Outpatient Follow-up:  1. Follow up with PCP in 1 week 2. Please obtain BMP/CBC in one week 3. Please follow up on the following pending results: Blood culture final result  Home Health: SNF Equipment/Devices: SNF  Discharge Condition: Stable CODE STATUS: Full code Diet recommendation: Regular diet   Brief/Interim Summary:  Admission HPI written by Briscoe Deutscher, MD   Chief Complaint: Fever, SOB   HPI: [name] [name] is a 41 y.o. female with medical history significant for cerebral palsy with spastic quadriplegia, hypertension, depression with anxiety, chronic pain syndrome, seizure disorder, and recent cervical and upper thoracic decompression and fusion, now presenting to the emergency department from her SNF for evaluation of fevers and dyspnea.  Patient underwent surgery on 11/17/2017 on her cervical and upper thoracic spine, had been recovering as expected and until she began to complain of some shortness of breath with productive cough and was noted to have a fever tonight.  Radiographs at her facility were concerning for pneumonia and a possible pneumothorax.  She was transported to the hospital for further evaluation of this.  ED Course: Upon arrival to the ED, patient is found to have a temp of 37.6 C, tachypneic to 30, saturating mid 90s on room air, and with stable blood pressure and normal heart rate.  EKG features normal sinus rhythm.  Chest x-ray demonstrates shallow inspiration and perihilar streaky densities suspected to be atelectasis or possibly infiltrate.  Chemistry panel is notable for mild elevations in transaminases and CBC features a stable ascitic anemia with hemoglobin of 10.1.  Lactic acid is reassuring at 0.7.  Blood and urine cultures were  collected, 30 cc/kg normal saline bolus was administered, and the patient was treated with empiric vancomycin and Zosyn.  She remained hemodynamically stable in the ED, has not been in any apparent respiratory distress, and will be a unit for ongoing evaluation and management of fever and dyspnea with suspected HCAP.     Hospital course:  Fever Dyspnea Initially treated as possible pneumonia, however, WBC normal, afebrile here (has been on antibiotics) and chest x-ray does not suggest pneumonia. Pain in her right chest that is not reproducible. On room air and tachycardia has resolved. She has a history of recurrent PE and has had her warfarin held secondary to recent spinal surgery. She is s/p IVC filter. Urine culture significant for proteus mirabilis infection. Transitioned from ceftriaxone to Augmentin. Continue for two more days. End date 11/30/17.  Chronic neck pain Patient is s/p cervical/thoracic decompression and fusion. Patient is on a lot of pain medication as an outpatient. Pain is worsened per patient. CT neck unremarkable for infection. Continue outpatient pain regimen.  Cerebral palsy Continued baclofen and amantadine  Depression Anxiety Stable. Continued Klonopin and fluoxetine  Anemia Stable. No bleeding.  History of recurrent PE Warfarin held as mentioned above. S/p IVC filter.  Essential hypertension Controlled.  RUQ pain Evidence of air on x-ray but thought to be in the bowel. Urine culture positive for proteus mirabilis  Urinary tract infection Fever and tachycardia on admission. Treating empirically. Urine cultures significant for proteus mirabilis, resistant only to nitrofurantoin. Discontinued cefepime and start Augmentin 875 mg BID to cover for ?oral infection in addition to UTI.  Oral pain Patient with very poor dentition. inflamed gums. Orthopantogram negative for abscess. Augmentin as above.  Discharge Diagnoses:  Principal Problem:    HCAP (healthcare-associated pneumonia) Active Problems:   Major depressive disorder, recurrent episode ([name])   CP (cerebral palsy), spastic ([name])   Chronic suprapubic catheter ([name])   Recurrent pulmonary embolism ([name])   Anxiety state   Chronic pain syndrome   HYPERTENSION, BENIGN   Anemia of chronic disease   Seizures ([name])    Discharge Instructions  Discharge Instructions    Diet - low sodium heart healthy   Complete by:  As directed    Increase activity slowly   Complete by:  As directed      Allergies as of 11/27/2017      Reactions   Morphine Dermatitis, Other (See Comments)   Skin turned red      Medication List    STOP taking these medications   oxyCODONE 5 MG immediate release tablet Commonly known as:  ROXICODONE     TAKE these medications   acetaminophen 500 MG tablet Commonly known as:  TYLENOL Take 500-650 mg by mouth See admin instructions. 500mg  three times daily and 650 three times daily as needed for pain (do not exceed 4000mg  in 24hrs)   amantadine 100 MG capsule Commonly known as:  SYMMETREL Take 100 mg by mouth daily.   amoxicillin-clavulanate 875-125 MG tablet Commonly known as:  AUGMENTIN Take 1 tablet by mouth every 12 (twelve) hours for 5 days.   antiseptic oral rinse Liqd 15 mLs by Mouth Rinse route as needed for dry mouth.   baclofen 20 MG tablet Commonly known as:  LIORESAL Take 20 mg by mouth 4 (four) times daily. For bladder spasms   bisacodyl 10 MG suppository Commonly known as:  DULCOLAX Place 10 mg rectally as needed for moderate constipation.   CERTA-VITE PO Take 1 tablet by mouth daily.   clonazePAM 0.5 MG tablet Commonly known as:  KLONOPIN Take 1.5 tablets (0.75 mg total) by mouth 3 (three) times daily.   fentaNYL 12 MCG/HR Commonly known as:  DURAGESIC - dosed mcg/hr Place 1 patch (12.5 mcg total) onto the skin every 3 (three) days.   FLUoxetine HCl 60 MG Tabs Take 60 mg by mouth daily.   HYDROmorphone 2 MG  tablet Commonly known as:  DILAUDID Take 1 tablet (2 mg total) by mouth every 4 (four) hours as needed for severe pain.   lactose free nutrition Liqd Take 237 mLs by mouth 2 (two) times daily between meals.   lamoTRIgine 100 MG tablet Commonly known as:  LAMICTAL Take 100 mg by mouth at bedtime.   lubiprostone 24 MCG capsule Commonly known as:  AMITIZA Take 1 capsule (24 mcg total) by mouth 2 (two) times daily with a meal.   magic mouthwash Soln Take 10 mLs by mouth 3 (three) times daily.   Melatonin 5 MG Tabs Take 5 mg by mouth at bedtime.   oxybutynin 5 MG tablet Commonly known as:  DITROPAN Take 5 mg by mouth 4 (four) times daily.   polyethylene glycol powder powder Commonly known as:  GLYCOLAX/MIRALAX Take 1  1/2 dose  daily in 12 ounces of fluid every day. What changed:    how much to take  how to take this  when to take this  additional instructions   QUEtiapine 100 MG tablet Commonly known as:  SEROQUEL Take 1 tablet (100 mg total) by mouth at bedtime. What changed:  how much to take   tiZANidine 4 MG capsule Commonly known as:  ZANAFLEX Take 4 mg by mouth 2 (  two) times daily.   warfarin 2 MG tablet Commonly known as:  COUMADIN Take as directed. If you are unsure how to take this medication, talk to your nurse or doctor. Original instructions:  Take 5 mg by mouth daily at 6 PM.       Allergies  Allergen Reactions  . Morphine Dermatitis and Other (See Comments)    Skin turned red    Consultations:  None   Procedures/Studies: Dg Orthopantogram  Result Date: 11/26/2017 CLINICAL DATA:  Gum pain EXAM: ORTHOPANTOGRAM/PANORAMIC COMPARISON:  None. FINDINGS: Technologist reports that patient was unable to remain still for this study. Best images available. Motion artifact limits visibility of the central mandible. No gross root lucencies. Multiple absent right greater than left maxillary teeth, primarily canine and bicuspid teeth. Probable absent  maxillary molar teeth. Unerupted right upper molar tooth. No fracture. IMPRESSION: Very limited study secondary to motion and limited visibility of the central teeth. There are multiple missing teeth, primarily from the right upper maxilla. No gross root lucencies are seen. No obvious bone destruction. Electronically Signed   By: Jasmine PangKim  Fujinaga M.D.   On: 11/26/2017 20:09   Dg Abd 1 View  Result Date: 11/25/2017 CLINICAL DATA:  Right upper quadrant abdominal pain EXAM: ABDOMEN - 1 VIEW COMPARISON:  CT abdomen 10/27/2015 FINDINGS: Pain pump noted, catheter extending up proximally to the T10 level. IVC filter and right upper quadrant clips noted. Mildly asymmetric sclerosis along the right sacroiliac joint. Upper normal amount of gas in small and large bowel. IMPRESSION: 1. Upper normal amount of gas in the small and large bowel but no overtly dilated small bowel. 2. Pain pump noted. 3. SI joint arthropathy, right greater than left. Electronically Signed   By: Gaylyn RongWalter  Liebkemann M.D.   On: 11/25/2017 12:53   Ct Cervical Spine W Contrast  Result Date: 11/26/2017 CLINICAL DATA:  Postoperative fever following cervical fusion. EXAM: CT CERVICAL SPINE WITH CONTRAST TECHNIQUE: Multidetector CT imaging of the cervical spine was performed during intravenous contrast administration. Multiplanar CT image reconstructions were also generated. CONTRAST:  75mL ISOVUE-300 IOPAMIDOL (ISOVUE-300) INJECTION 61% COMPARISON:  04/10/2017 FINDINGS: Alignment: There is curvature convex to the left with the apex in the lower cervical region, reduced since the preoperative study. No antero or retrolisthesis. Skull base and vertebrae: Interval fusion procedure with posterior element screws and rods from C2 through C4. Anterior cervical discectomy and fusion from C4 to T1. Interbody fusion material appears well positioned. No hardware complications seen. No anterior screw at T1. Soft tissues and spinal canal: Recent surgical changes in  the posterior soft tissues. Small amount of fluid and few air bubbles typical of recent surgery. No unexpected collection or definitive evidence of infection. Because of streak artifact from the hardware, detail within the canal is lacking. Disc levels:  No complicating feature seen at any disc level. Upper chest: Mild pulmonary density that could be atelectasis or mild edema/ fluid overload. Other: None IMPRESSION: Interval fusion from C2 through T4 as outlined above. No evidence of hardware complication. Appearance of the operative soft tissues is as expected for recent surgery. No finding that would be specific for infection. Electronically Signed   By: Paulina FusiMark  Shogry M.D.   On: 11/26/2017 14:11   Dg Chest Port 1 View  Result Date: 11/24/2017 CLINICAL DATA:  41 year old female with fever and sepsis. EXAM: PORTABLE CHEST 1 VIEW COMPARISON:  CT dated 10/27/2015 FINDINGS: There is shallow inspiration with minimal bibasilar atelectasis. Perihilar streaky densities, likely atelectatic changes or  vascular prominence. Developing infiltrate is less likely but not excluded. Clinical correlation is recommended. No focal consolidation, pleural effusion, or pneumothorax. The cardiac silhouette is within normal limits. Air in the right upper abdomen and under the right hemidiaphragm noted which is likely within the colon. There is colonic loop in the right upper abdomen with probable interposition of the colon between the liver and anterior peritoneal wall. Pneumoperitoneum is much less likely. Clinical correlation is recommended. Extensive postsurgical changes and fixation of the cervical and upper thoracic spine noted. Right upper quadrant cholecystectomy clips and an IVC filter is seen. A spinal stimulator noted with tip at the level of T10 vertebra. IMPRESSION: 1. Shallow inspiration. Perihilar streaky densities, likely atelectatic changes or related to vascular prominence. Developing infiltrate is less likely but not  excluded. Clinical correlation is recommended. 2. Air density under the right hemidiaphragm appears within the colon. Electronically Signed   By: Elgie Collard M.D.   On: 11/24/2017 01:51   US Abdomen Limited Ruq  Result Date: 11/24/2017 CLINICAL DATA:  Elevated liver enzymes. EXAM: ULTRASOUND ABDOMEN LIMITED RIGHT UPPER QUADRANT COMPARISON:  None. FINDINGS: Gallbladder: Surgically absent Common bile duct: Diameter: 2 mm Liver: No focal mass. Portal vein is patent on color Doppler imaging with normal direction of blood flow towards the liver. IMPRESSION: 1. No cause for elevated liver enzymes identified. The patient is status post cholecystectomy. 2. A small right pleural effusion is identified. Electronically Signed   By: Gerome Sam III M.D   On: 11/24/2017 11:59     Subjective: No concerns overnight.  Discharge Exam: Vitals:   11/27/17 0952 11/27/17 1325  BP: 121/83 (!) 95/55  Pulse: 73 84  Resp: 16 16  Temp: 98.4 F (36.9 C) 98.3 F (36.8 C)  SpO2: 95% 93%   Vitals:   11/27/17 0103 11/27/17 0501 11/27/17 0952 11/27/17 1325  BP: 114/60 103/64 121/83 (!) 95/55  Pulse: 97 83 73 84  Resp: 20 20 16 16   Temp: 99.3 F (37.4 C) 99.1 F (37.3 C) 98.4 F (36.9 C) 98.3 F (36.8 C)  TempSrc: Oral Oral Oral Oral  SpO2: 98% 97% 95% 93%  Weight:      Height:        General: Pt is alert, awake, not in acute distress Cardiovascular: RRR, S1/S2 +, no rubs, no gallops Respiratory: CTA bilaterally, no wheezing, no rhonchi Abdominal: Soft, NT, ND, bowel sounds + Extremities: no edema, no cyanosis. Multiple contractures. Skin: anterior neck incision without surrounding erythema    The results of significant diagnostics from this hospitalization (including imaging, microbiology, ancillary and laboratory) are listed below for reference.     Microbiology: Recent Results (from the past 240 hour(s))  Blood Culture (routine x 2)     Status: None (Preliminary result)   Collection  Time: 11/24/17  1:20 AM  Result Value Ref Range Status   Specimen Description BLOOD LEFT ANTECUBITAL  Final   Special Requests   Final    BOTTLES DRAWN AEROBIC AND ANAEROBIC Blood Culture adequate volume   Culture NO GROWTH 3 DAYS  Final   Report Status PENDING  Incomplete  Blood Culture (routine x 2)     Status: None (Preliminary result)   Collection Time: 11/24/17  1:30 AM  Result Value Ref Range Status   Specimen Description BLOOD LEFT WRIST  Final   Special Requests   Final    BOTTLES DRAWN AEROBIC AND ANAEROBIC Blood Culture adequate volume   Culture NO GROWTH 3 DAYS  Final  Report Status PENDING  Incomplete  Urine culture     Status: Abnormal (Preliminary result)   Collection Time: 11/24/17  1:34 AM  Result Value Ref Range Status   Specimen Description URINE, CATHETERIZED  Final   Special Requests NONE  Final   Culture (A)  Final    >=100,000 COLONIES/mL PROTEUS MIRABILIS CULTURE REINCUBATED FOR BETTER GROWTH    Report Status PENDING  Incomplete   Organism ID, Bacteria PROTEUS MIRABILIS (A)  Final      Susceptibility   Proteus mirabilis - MIC*    AMPICILLIN <=2 SENSITIVE Sensitive     CEFAZOLIN <=4 SENSITIVE Sensitive     CEFTRIAXONE <=1 SENSITIVE Sensitive     CIPROFLOXACIN <=0.25 SENSITIVE Sensitive     GENTAMICIN <=1 SENSITIVE Sensitive     IMIPENEM 2 SENSITIVE Sensitive     NITROFURANTOIN 128 RESISTANT Resistant     TRIMETH/SULFA <=20 SENSITIVE Sensitive     AMPICILLIN/SULBACTAM <=2 SENSITIVE Sensitive     PIP/TAZO <=4 SENSITIVE Sensitive     * >=100,000 COLONIES/mL PROTEUS MIRABILIS  MRSA PCR Screening     Status: None   Collection Time: 11/24/17 12:09 PM  Result Value Ref Range Status   MRSA by PCR NEGATIVE NEGATIVE Final    Comment:        The GeneXpert MRSA Assay (FDA approved for NASAL specimens only), is one component of a comprehensive MRSA colonization surveillance program. It is not intended to diagnose MRSA infection nor to guide or monitor  treatment for MRSA infections.      Labs: BNP (last 3 results) No results for input(s): BNP in the last 8760 hours. Basic Metabolic Panel: Recent Labs  Lab 11/24/17 0120 11/25/17 0922 11/26/17 1039  NA 136 138 137  K 4.1 3.9 4.2  CL 101 107 101  CO2 28 26 26   GLUCOSE 144* 82 92  BUN 7 6 6   CREATININE 0.50 0.40* 0.43*  CALCIUM 8.6* 8.1* 8.7*   Liver Function Tests: Recent Labs  Lab 11/24/17 0120 11/25/17 0922 11/26/17 1039  AST 47* 49* 36  ALT 94* 73* 67*  ALKPHOS 198* 138* 160*  BILITOT 0.7 0.6 0.6  PROT 6.2* 5.3* 6.3*  ALBUMIN 3.0* 2.6* 2.9*   No results for input(s): LIPASE, AMYLASE in the last 168 hours. No results for input(s): AMMONIA in the last 168 hours. CBC: Recent Labs  Lab 11/24/17 0120 11/26/17 1039  WBC 8.0 6.8  NEUTROABS 5.1  --   HGB 10.1* 10.4*  HCT 29.8* 32.9*  MCV 92.5 94.0  PLT 309 431*   Cardiac Enzymes: No results for input(s): CKTOTAL, CKMB, CKMBINDEX, TROPONINI in the last 168 hours. BNP: Invalid input(s): POCBNP CBG: No results for input(s): GLUCAP in the last 168 hours. D-Dimer No results for input(s): DDIMER in the last 72 hours. Hgb A1c No results for input(s): HGBA1C in the last 72 hours. Lipid Profile No results for input(s): CHOL, HDL, LDLCALC, TRIG, CHOLHDL, LDLDIRECT in the last 72 hours. Thyroid function studies No results for input(s): TSH, T4TOTAL, T3FREE, THYROIDAB in the last 72 hours.  Invalid input(s): FREET3 Anemia work up No results for input(s): VITAMINB12, FOLATE, FERRITIN, TIBC, IRON, RETICCTPCT in the last 72 hours. Urinalysis    Component Value Date/Time   COLORURINE AMBER (A) 11/24/2017 0134   APPEARANCEUR CLOUDY (A) 11/24/2017 0134   LABSPEC 1.016 11/24/2017 0134   PHURINE 9.0 (H) 11/24/2017 0134   GLUCOSEU NEGATIVE 11/24/2017 0134   HGBUR NEGATIVE 11/24/2017 0134   HGBUR large 10/03/2010 1530  BILIRUBINUR NEGATIVE 11/24/2017 0134   BILIRUBINUR neg 03/15/2011 1719   KETONESUR NEGATIVE  11/24/2017 0134   PROTEINUR 100 (A) 11/24/2017 0134   UROBILINOGEN 1.0 10/08/2015 2348   NITRITE NEGATIVE 11/24/2017 0134   LEUKOCYTESUR LARGE (A) 11/24/2017 0134   LEUKOCYTESUR mod 01/26/2014   Sepsis Labs Invalid input(s): PROCALCITONIN,  WBC,  LACTICIDVEN Microbiology Recent Results (from the past 240 hour(s))  Blood Culture (routine x 2)     Status: None (Preliminary result)   Collection Time: 11/24/17  1:20 AM  Result Value Ref Range Status   Specimen Description BLOOD LEFT ANTECUBITAL  Final   Special Requests   Final    BOTTLES DRAWN AEROBIC AND ANAEROBIC Blood Culture adequate volume   Culture NO GROWTH 3 DAYS  Final   Report Status PENDING  Incomplete  Blood Culture (routine x 2)     Status: None (Preliminary result)   Collection Time: 11/24/17  1:30 AM  Result Value Ref Range Status   Specimen Description BLOOD LEFT WRIST  Final   Special Requests   Final    BOTTLES DRAWN AEROBIC AND ANAEROBIC Blood Culture adequate volume   Culture NO GROWTH 3 DAYS  Final   Report Status PENDING  Incomplete  Urine culture     Status: Abnormal (Preliminary result)   Collection Time: 11/24/17  1:34 AM  Result Value Ref Range Status   Specimen Description URINE, CATHETERIZED  Final   Special Requests NONE  Final   Culture (A)  Final    >=100,000 COLONIES/mL PROTEUS MIRABILIS CULTURE REINCUBATED FOR BETTER GROWTH    Report Status PENDING  Incomplete   Organism ID, Bacteria PROTEUS MIRABILIS (A)  Final      Susceptibility   Proteus mirabilis - MIC*    AMPICILLIN <=2 SENSITIVE Sensitive     CEFAZOLIN <=4 SENSITIVE Sensitive     CEFTRIAXONE <=1 SENSITIVE Sensitive     CIPROFLOXACIN <=0.25 SENSITIVE Sensitive     GENTAMICIN <=1 SENSITIVE Sensitive     IMIPENEM 2 SENSITIVE Sensitive     NITROFURANTOIN 128 RESISTANT Resistant     TRIMETH/SULFA <=20 SENSITIVE Sensitive     AMPICILLIN/SULBACTAM <=2 SENSITIVE Sensitive     PIP/TAZO <=4 SENSITIVE Sensitive     * >=100,000 COLONIES/mL  PROTEUS MIRABILIS  MRSA PCR Screening     Status: None   Collection Time: 11/24/17 12:09 PM  Result Value Ref Range Status   MRSA by PCR NEGATIVE NEGATIVE Final    Comment:        The GeneXpert MRSA Assay (FDA approved for NASAL specimens only), is one component of a comprehensive MRSA colonization surveillance program. It is not intended to diagnose MRSA infection nor to guide or monitor treatment for MRSA infections.      Time coordinating discharge: Over 30 minutes  SIGNED:   Jacquelin Hawking, MD [facility] 11/27/2017, 1:31 PM Pager [phone]  If 7PM-7AM, please contact night-coverage [email or web address] Password [email or web address]

## 2017-11-27 NOTE — Progress Notes (Signed)
[facility] Report called to facility nurse.  IV discontinued. [facility] to transport from hospital.  Prescription for Dilaudid with discharge paperwork. [name] [name]

## 2017-11-27 NOTE — Plan of Care (Signed)
  Education: Knowledge of General Education information will improve 11/27/2017 0257 - Progressing by Olena Materobinson, Xerxes Agrusa G, RN Note POC reviewed with pt.

## 2017-11-27 NOTE — Progress Notes (Addendum)
[facility] Anticipated discharge date: 12/27 Family notified:  pt father Transportation by [employer] called at 2:40pm  CSW signing off.  Burna SisJenna H. Jimia Gentles, LCSW Clinical Social Worker [phone]

## 2017-11-27 NOTE — NC FL2 (Signed)
[place] MEDICAID FL2 LEVEL OF CARE SCREENING TOOL     IDENTIFICATION  Patient Name: [name] SlickerJudith [name] Birthdate: 07-25-1976 Sex: female Admission Date (Current Location): 11/24/2017  [facility] [place]   Facility and Address:  [facility], [address], [place], [place] [address]      Provider Number: [id number]  Attending Physician Name and Address:  Narda BondsNettey, Ralph A, MD  Relative Name and Phone Number:       Current Level of Care: Hospital Recommended Level of Care: Skilled Nursing Facility Prior Approval Number:    Date Approved/Denied:   PASRR Number:    Discharge Plan: SNF    Current Diagnoses: Patient Active Problem List   Diagnosis Date Noted  . Incontinent of feces 03/11/2016  . Chronic anticoagulation 03/11/2016  . Involuntary movements 02/22/2016  . Seizures ([name]) 02/12/2016  . Dyspnea 02/12/2016  . Cerebral palsy, quadriplegic ([name]) 02/06/2016  . Swelling of extremity 01/13/2016  . Pressure ulcer 10/09/2015  . Status post insertion of inferior vena caval filter 09/28/2015  . Absence of bladder continence 08/25/2015  . Person living in residential institution 06/14/2015  . Pulmonary hypertension ([name])   . Anemia of chronic disease   . Spasticity 03/15/2015  . Constipation, chronic 10/10/2014  . Athetoid cerebral palsy ([name]) 04/13/2014  . Dystonia 04/12/2014  . UTI (lower urinary tract infection) 03/16/2014  . Presence of intrathecal baclofen pump 03/07/2014  . Dental caries 07/18/2013  . Multiple thyroid nodules 05/20/2013  . Osteoarthrosis 04/07/2013  . [name]   . Dysarthria   . GERD (gastroesophageal reflux disease)   . Neurogenic bladder 02/05/2013  . Anxiety state 10/21/2012  . Chronic pain syndrome 06/17/2012  . Recurrent pulmonary embolism ([name]) 05/03/2011  . Pulmonary embolism with infarction ([name]) 05/03/2011  . Chronic suprapubic catheter ([name]) 03/15/2011  . Disease  of female genital organs 10/24/2010  . HYPERTENSION, BENIGN 01/31/2010  . CP (cerebral palsy), spastic ([name]) 12/27/2008  . Contracted, joint, multiple sites 12/27/2008  . Major depressive disorder, recurrent episode ([name]) 01/29/2007  . Obsessive Compulsive Disorder 01/29/2007  . Transient alteration of awareness, recurrent 09/08/2006    Orientation RESPIRATION BLADDER Height & Weight        Normal Incontinent, Indwelling catheter Weight: 108 lb 7.5 oz (49.2 kg) Height:  4\' 5"  (134.6 cm)  BEHAVIORAL SYMPTOMS/MOOD NEUROLOGICAL BOWEL NUTRITION STATUS      Incontinent Diet(regular)  AMBULATORY STATUS COMMUNICATION OF NEEDS Skin   Extensive Assist Verbally Surgical wounds(on cervical with liquid adhesive and on vertebre with gauze)                       Personal Care Assistance Level of Assistance  Bathing, Feeding, Dressing Bathing Assistance: Maximum assistance Feeding assistance: Independent Dressing Assistance: Maximum assistance     Functional Limitations Info  Sight, Hearing, Speech          SPECIAL CARE FACTORS FREQUENCY                       Contractures      Additional Factors Info  Code Status, Allergies, Psychotropic Code Status Info: FULL Allergies Info: Morphine Psychotropic Info: seroquel, klonopin         Current Medications (11/27/2017):  This is the current hospital active medication list Current Facility-Administered Medications  Medication Dose Route Frequency Provider Last Rate Last Dose  . acetaminophen (TYLENOL) tablet 650 mg  650 mg Oral Q6H PRN  Narda BondsNettey, Ralph A, MD   650 mg at 11/26/17 2204  . amantadine (SYMMETREL) capsule 100 mg  100 mg Oral Daily Opyd, Lavone Neriimothy S, MD   100 mg at 11/27/17 0914  . amoxicillin-clavulanate (AUGMENTIN) 875-125 MG per tablet 1 tablet  1 tablet Oral Q12H Narda BondsNettey, Ralph A, MD   1 tablet at 11/27/17 0914  . antiseptic oral rinse (BIOTENE) solution 15 mL  15 mL Mouth Rinse PRN Opyd, Lavone Neriimothy S, MD      .  baclofen (LIORESAL) tablet 20 mg  20 mg Oral QID Opyd, Lavone Neriimothy S, MD   20 mg at 11/27/17 1328  . bisacodyl (DULCOLAX) suppository 10 mg  10 mg Rectal PRN Opyd, Lavone Neriimothy S, MD      . clonazePAM (KLONOPIN) disintegrating tablet 0.75 mg  0.75 mg Oral TID Narda BondsNettey, Ralph A, MD   0.75 mg at 11/27/17 0913  . feeding supplement (ENSURE ENLIVE) (ENSURE ENLIVE) liquid 237 mL  237 mL Oral BID BM Narda BondsNettey, Ralph A, MD   237 mL at 11/27/17 1000  . fentaNYL (DURAGESIC - dosed mcg/hr) 12.5 mcg  12.5 mcg Transdermal Q72H Opyd, Lavone Neriimothy S, MD   12.5 mcg at 11/27/17 0913  . FLUoxetine (PROZAC) capsule 60 mg  60 mg Oral Daily Opyd, Lavone Neriimothy S, MD   60 mg at 11/27/17 0914  . HYDROmorphone (DILAUDID) tablet 2 mg  2 mg Oral Q4H PRN Opyd, Lavone Neriimothy S, MD   2 mg at 11/27/17 1328  . lactose free nutrition (Boost) liquid 237 mL  237 mL Oral BID BM Opyd, Lavone Neriimothy S, MD   237 mL at 11/27/17 1000  . lamoTRIgine (LAMICTAL) tablet 100 mg  100 mg Oral QHS Opyd, Lavone Neriimothy S, MD   100 mg at 11/26/17 2208  . lubiprostone (AMITIZA) capsule 24 mcg  24 mcg Oral BID WC Opyd, Lavone Neriimothy S, MD   24 mcg at 11/27/17 0913  . magic mouthwash  10 mL Oral TID Briscoe Deutscherpyd, Timothy S, MD   10 mL at 11/27/17 0913  . Melatonin TABS 6 mg  6 mg Oral QHS Opyd, Lavone Neriimothy S, MD   6 mg at 11/26/17 2205  . oxybutynin (DITROPAN) tablet 5 mg  5 mg Oral QID Opyd, Lavone Neriimothy S, MD   5 mg at 11/27/17 1328  . polyethylene glycol (MIRALAX / GLYCOLAX) packet 17 g  17 g Oral BID Opyd, Lavone Neriimothy S, MD   17 g at 11/27/17 1000  . QUEtiapine (SEROQUEL) tablet 125 mg  125 mg Oral QHS Briscoe Deutscherpyd, Timothy S, MD   125 mg at 11/26/17 2205  . tiZANidine (ZANAFLEX) tablet 4 mg  4 mg Oral BID Narda BondsNettey, Ralph A, MD   4 mg at 11/27/17 [id number]     Discharge Medications: Please see discharge summary for a list of discharge medications.  Relevant Imaging Results:  Relevant Lab Results:   Additional Information SS#: [id number]  Burna SisUris, Nina Hoar H, LCSW

## 2017-11-28 DIAGNOSIS — G808 Other cerebral palsy: Secondary | ICD-10-CM | POA: Diagnosis not present

## 2017-11-28 DIAGNOSIS — I2699 Other pulmonary embolism without acute cor pulmonale: Secondary | ICD-10-CM | POA: Diagnosis not present

## 2017-11-28 DIAGNOSIS — M542 Cervicalgia: Secondary | ICD-10-CM | POA: Diagnosis not present

## 2017-11-28 DIAGNOSIS — N390 Urinary tract infection, site not specified: Secondary | ICD-10-CM | POA: Diagnosis not present

## 2017-11-29 LAB — CULTURE, BLOOD (ROUTINE X 2)
CULTURE: NO GROWTH
CULTURE: NO GROWTH
Special Requests: ADEQUATE
Special Requests: ADEQUATE

## 2017-11-29 LAB — URINE CULTURE

## 2017-12-03 DIAGNOSIS — N390 Urinary tract infection, site not specified: Secondary | ICD-10-CM | POA: Diagnosis not present

## 2017-12-03 DIAGNOSIS — I2699 Other pulmonary embolism without acute cor pulmonale: Secondary | ICD-10-CM | POA: Diagnosis not present

## 2017-12-03 DIAGNOSIS — M4103 Infantile idiopathic scoliosis, cervicothoracic region: Secondary | ICD-10-CM | POA: Diagnosis not present

## 2017-12-03 DIAGNOSIS — J188 Other pneumonia, unspecified organism: Secondary | ICD-10-CM | POA: Diagnosis not present

## 2017-12-08 DIAGNOSIS — M4103 Infantile idiopathic scoliosis, cervicothoracic region: Secondary | ICD-10-CM | POA: Diagnosis not present

## 2017-12-08 DIAGNOSIS — R634 Abnormal weight loss: Secondary | ICD-10-CM | POA: Diagnosis not present

## 2017-12-08 DIAGNOSIS — K1379 Other lesions of oral mucosa: Secondary | ICD-10-CM | POA: Diagnosis not present

## 2017-12-10 DIAGNOSIS — M4143 Neuromuscular scoliosis, cervicothoracic region: Secondary | ICD-10-CM | POA: Diagnosis not present

## 2017-12-10 DIAGNOSIS — Z4789 Encounter for other orthopedic aftercare: Secondary | ICD-10-CM | POA: Diagnosis not present

## 2017-12-10 DIAGNOSIS — G800 Spastic quadriplegic cerebral palsy: Secondary | ICD-10-CM | POA: Diagnosis not present

## 2017-12-12 DIAGNOSIS — K1379 Other lesions of oral mucosa: Secondary | ICD-10-CM | POA: Diagnosis not present

## 2017-12-12 DIAGNOSIS — M4180 Other forms of scoliosis, site unspecified: Secondary | ICD-10-CM | POA: Diagnosis not present

## 2017-12-12 DIAGNOSIS — M6240 Contracture of muscle, unspecified site: Secondary | ICD-10-CM | POA: Diagnosis not present

## 2017-12-19 ENCOUNTER — Other Ambulatory Visit (HOSPITAL_COMMUNITY): Payer: Self-pay | Admitting: Internal Medicine

## 2017-12-19 ENCOUNTER — Ambulatory Visit: Payer: Medicare Other | Admitting: Physical Medicine & Rehabilitation

## 2017-12-19 DIAGNOSIS — R1310 Dysphagia, unspecified: Secondary | ICD-10-CM

## 2017-12-22 DIAGNOSIS — K1379 Other lesions of oral mucosa: Secondary | ICD-10-CM | POA: Diagnosis not present

## 2017-12-22 DIAGNOSIS — M4180 Other forms of scoliosis, site unspecified: Secondary | ICD-10-CM | POA: Diagnosis not present

## 2017-12-23 ENCOUNTER — Other Ambulatory Visit: Payer: Self-pay

## 2017-12-23 ENCOUNTER — Encounter: Payer: No Typology Code available for payment source | Attending: Physical Medicine & Rehabilitation

## 2017-12-23 ENCOUNTER — Ambulatory Visit (HOSPITAL_BASED_OUTPATIENT_CLINIC_OR_DEPARTMENT_OTHER): Payer: Medicare Other | Admitting: Physical Medicine & Rehabilitation

## 2017-12-23 ENCOUNTER — Encounter: Payer: Self-pay | Admitting: Physical Medicine & Rehabilitation

## 2017-12-23 VITALS — BP 115/75 | HR 94

## 2017-12-23 DIAGNOSIS — G800 Spastic quadriplegic cerebral palsy: Secondary | ICD-10-CM | POA: Diagnosis not present

## 2017-12-23 DIAGNOSIS — R252 Cramp and spasm: Secondary | ICD-10-CM | POA: Insufficient documentation

## 2017-12-23 DIAGNOSIS — G801 Spastic diplegic cerebral palsy: Secondary | ICD-10-CM

## 2017-12-23 NOTE — Patient Instructions (Signed)
Will repeat Dysport next visit

## 2017-12-23 NOTE — Progress Notes (Signed)
Subjective:    Patient ID: [name] SlickerJudith [name], female    DOB: 08-Jul-1976, 42 y.o.   MRN: [id number]  HPI  42 year old female with history of spastic quadriparesis secondary to cerebral palsy.  She also has severe dysarthria. She has been receiving botulinum toxin injections as well as Dysport injections for her spasticity.  Most recently she had treatment of her right upper extremity RIGHT Biceps200 FCR200 FCU200 FDP100 FDS 100 Pronator teres 100 Pronator quad 100  Patient does not note any big changes in her tone and is here to follow-up on clinical results Pain Inventory Average Pain 8 Pain Right Now 8 My pain is aching  In the last 24 hours, has pain interfered with the following? General activity 8 Relation with others 8 Enjoyment of life 8 What TIME of day is your pain at its worst? varies Sleep (in general) Poor  Pain is worse with: sitting and some activites Pain improves with: medication Relief from Meds: 8  Mobility ability to climb steps?  no do you drive?  no use a wheelchair needs help with transfers  Function disabled: date disabled . I need assistance with the following:  feeding, dressing, bathing, toileting, meal prep, household duties and shopping  Neuro/Psych bladder control problems bowel control problems spasms depression  Prior Studies Any changes since last visit?  no  Physicians involved in your care Any changes since last visit?  no   Family History  Problem Relation Age of Onset  . Asthma Father   . Colon cancer Maternal Grandmother        Died in her 6060's  . Breast cancer Paternal Grandmother        Died in her 2170's  . Heart attack Maternal Grandfather        Died in his 8970's  . Alzheimer's disease Paternal Grandfather        Died in his 1080's  . Breast cancer Mother   . Stomach cancer Neg Hx    Social History   Socioeconomic History  . Marital status: Divorced    Spouse name: None  . Number of children: 2  . Years of  education: college  . Highest education level: None  Social Needs  . Financial resource strain: None  . Food insecurity - worry: None  . Food insecurity - inability: None  . Transportation needs - medical: None  . Transportation needs - non-medical: None  Occupational History    Employer: UNEMPLOYED  Tobacco Use  . Smoking status: Never Smoker  . Smokeless tobacco: Never Used  Substance and Sexual Activity  . Alcohol use: Yes    Alcohol/week: 0.6 oz    Types: 1 Glasses of wine per week    Comment: Drinks alcohol once a month.  . Drug use: No  . Sexual activity: No  Other Topics Concern  . None  Social History Narrative   [name] graduated from college with an Scientist, research (physical sciences)Associates Degree in Surveyor, mineralsCriminal Justice.    She enjoys shopping.   Lives in a nursing facility[facility] since 02/11[facility]      Ailene ArdsFrank Haith, who has schizophrenia, MR, is father of daughter.  Homero FellersFrank and pt are no longer together.  Homero FellersFrank does not see Norberta KeensKiarra. Daughter is 648 yo, Stage managerKiarra Haith, lives with pt.        Maisie Fushomas [phone]), lives with pt's parents.      Needs assistance with ADL's and IADL's--has personal care services 20 hrs/wk;       No tobacco,  EtOH, drugs.      **Patient very concerned about her kids being taken from her.      **Case Manager: Jack Quarto [phone]      Patient lives [facility]   Past Surgical History:  Procedure Laterality Date  . ABDOMINAL HYSTERECTOMY    . APPENDECTOMY    . BACLOFEN PUMP REFILL     x 3 times  . CARPAL TUNNEL RELEASE  08/2008   Dr Teressa Senter  . CESAREAN SECTION     x 2  . CHOLECYSTECTOMY  10/14/2015   Procedure: LAPAROSCOPIC CHOLECYSTECTOMY;  Surgeon: Violeta Gelinas, MD;  Location[facility] OR;  Service: General;;  . COLPOSCOPY  06/2000  . INTRAUTERINE DEVICE INSERTION  04/30/11   Inserted by [facility] GYN for endometriosis  . LAPAROSCOPIC ASSISTED VAGINAL HYSTERECTOMY  10/27/2012  . MULTIPLE EXTRACTIONS WITH ALVEOLOPLASTY Bilateral 07/19/2013     Procedure: EXTRACTIONS #4, 0,98,11,91;  Surgeon: Francene Finders, DDS;  Location: [facility] OR;  Service: Oral Surgery;  Laterality: Bilateral;  . NEUROMA SURGERY Left    anterior and posterior intersosseous  . PAIN PUMP IMPLANTATION N/A 03/07/2014   Procedure: baclofen pump revision/replacement and Catheter connection replacement;  Surgeon: Cristi Loron, MD;  Location: [facility] NEURO ORS;  Service: Neurosurgery;  Laterality: N/A;  baclofen pump revision/replacement and Catheter connection replacement  . PROGRAMABLE BACLOFEN PUMP REVISION  03/17/14   Battery Replacement   . TUBAL LIGATION  2003  . URETHRA SURGERY    . WRIST SURGERY  06/2010   Dr Dierdre Searles, hand surgeon, [name]   Past Medical History:  Diagnosis Date  . Abdominal pain 01/02/2012   Overview:  Overview:  Per Previous pcp note- Dr. Ta--Pt has history of endometriosis and had DUB in 2011.  She She had u/s that showed normal endometrial stripe.  She had endometrial bx that was wnl.  She has a lot of abd cramping every month.  This cramping was sometimes not relieved with hydrocodone.  She was referred to [facility] for IUD placement to help endometriosis and abd cramping.  [name]  . Abdominal pain 01/02/2012   Overview:  Overview:  Per Previous pcp note- Dr. Ta--Pt has history of endometriosis and had DUB in 2011.  She She had u/s that showed normal endometrial stripe.  She had endometrial bx that was wnl.  She has a lot of abd cramping every month.  This cramping was sometimes not relieved with hydrocodone.  She was referred to [facility] for IUD placement to help endometriosis and abd cramping.  [name] was placed 04/30/11 by [facility].  Pt still complains of abd cramping, which I am treating with hydrocodone and ultram. Pt reports some initial relief of pain from IUD, but states it has now returned back to similar to previous cramping. Gave patient a shot of Toradol 60 mg IM today-since positive exacerbation of abdominal cramping during past 2 weeks.  Physical exam reassuring. Patient may need to return to [facility] for further workup since she is plugged in with the GYN providers there for further treatment options regarding endometriosis and abdominal cramping. The dysfunctional uterine bleeding now well contr  . Acute cystitis with hematuria   . Acute cystitis without hematuria   . Acute respiratory failure with hypoxia ([name])   . Anemia   . Cellulitis 12/22/2014  . Cerebral palsy ([name])    Spastic Cerebral palsy, mentally intact  . Contracture, joint, multiple sites    Electric wheelchair, uses left hand to operate chair.   [name] Kitchen  Depression   . Disease of female genital organs 10/24/2010   Overview:  Overview:  Pt has history of endometriosis and had DUB in 2011.  She had u/s that showed normal endometrial stripe.  She had endometrial bx that was wnl.  She has a lot of abd cramping every month.  This cramping was sometimes not relieved with hydrocodone.  She was referred to [facility] for IUD placement to help endometriosis and abd cramping.  [name] was placed 04/30/11 by [facility].  Pt still complains of abd cramping, which was treated with hydrocodone and ultram.   Last seen at [place] by OB/GYN 12/24/11, given Depo Lupron injection.  Ordering CT scan to r/o additional cause of abdominal pain (pt with known endometriosis and will not tolerate vaginal U/S, pain not improved s/p [name] or with Lupron injection-- only caused hot flashes and amenorrhea, no pain relief).  Also made referral to pain clinic for better pain control.  Do not think hysterectomy would help with pain, and concern for contamination of baclofen pump if did proceed with surgery.    [name]: Pt was seen by   . [name]   . Dysarthria   . Dyspnea and respiratory abnormality 02/04/2011   Overview:  Overview:  Pt has history of recurrent PE and is on chronic coumadin.  Pt has been complaining of dyspnea that she describes as chest tightness.  She  has been to the ER multiple times for this.  Usually she gets CTA and serial CE.  She has had at least 6 CTA in a period of since 2011.  She also has an O2 requirement. It is unclear why pt feels dyspneic, complains of chest tightness, or has O2 requirement.  I thought that her complaint may be from deconditioning and referred her to PT.  Unfortunately Medicaid only pays for 4 PT visits.  Pt should be doing these exercises at home, but she has not.  I have referred her to [facility], and she has been seen by [name].  He did not understand the etiology of her dyspnea and O2 requirement.  He will continue to follow her.   Last Assessment & Plan:  Pt presents with concerns for chest tightness and dyspnea.  She was seen in ED yesterday and discharged home.  While there she had a CT chest 04/20/11 showed 1.  Multifocal degradation as detailed above.  N  . Endometriosis   . GERD (gastroesophageal reflux disease)   . Gram positive sepsis ([name]) 10/24/2014  . Gross hematuria 03/12/2013   Overview:  Overview:  status post replacement of suprapubic tube 03/04/2013  Last Assessment & Plan:  Recent urine culture collected in ED on 5/4 did not result in significant growth. Moreover, patient has been asymptomatic with normal WBC, no fever and no true symptoms making symptomatic UTI less likely.  Will continue to monitor for any sign of infection.  Patient's xarelto had been held during her recent hospitalization due to hematuria. This has since been re-started. Monitor bleeding.    [name] Kitchen HCAP (healthcare-associated pneumonia) 10/25/2014  . HCAP (healthcare-associated pneumonia) 11/24/2017  . History of anticoagulant therapy 10/21/2012  . History of endometriosis 10/2012   OBGYN [facility]: Laparoscopic Endometrial Ablation, TVH,  . History of recurrent UTIs   . Hypertension   . HYPERTENSION, BENIGN 01/31/2010   Qualifier: Diagnosis of  By: [name], MD, [name] Incontinent of feces 03/11/2016  . Infantile cerebral palsy  ([name]) 01/29/2007  Overview:  She has spastic CP.  She is mentally intact.  She is wheelchair bound.  She is wheelchair bound for ambulation      . Intracervical pessary 05/07/2011   Overview:  Overview:  Placed by [facility] GYN 04/30/11 to treat DUB and Endometriosis.  She is seen at GYN clinic at [facility] but was considered a poor surgical candidate and referred her to [facility].  For DUB she had pelvic ultrasound that showed thin stripe and she had endometrial Bx by Dr Jennette Kettle in GYN clinic, which was negative.     . Macroscopic hematuria 03/10/2013   status post replacement of suprapubic tube 03/04/2013   . Migraine   . OCD (obsessive compulsive disorder)   . Parapneumonic effusion 10/25/2014  . Presence of intrathecal baclofen pump 03/07/2014   R LQ. Insertion T10.    [name] Kitchen Psychiatric illness 10/31/2014  . Pulmonary embolism ([name])    Lifetime Coumadin  . Pulmonary embolism ([name]) 2011, 01/2011   Will be on lifetime coumadin  . Recurrent pulmonary embolism ([name]) 05/03/2011   Pt has history of recurrent PE and is on chronic coumadin.  Pt has been complaining of dyspnea that she describes as chest tightness.  She has been to the ER multiple times for this.  Usually she gets CTA and serial CE.  She has had at least 6 CTA in a period of since 2011.  She also has an O2 requirement. It is unclear why pt feels dyspneic, complains of chest tightness, or has O2 requirement.  I thought that her complaint may be from deconditioning and referred her to PT.  Unfortunately Medicaid only pays for 4 PT visits.  Pt should be doing these exercises at home, but she has not.  I have referred her to [facility], and she has been seen by [name].  He did not understand the etiology of her dyspnea and O2 requirement.  He will continue to follow her.     . Seborrheic keratosis 01/18/2011  . Seizures ([name]) 02/12/2016  . Spasticity 01/05/2014  . Transient alteration of awareness, recurrent 09/08/2006   Recurrent episodes where Anokhi  loses  contact with her world and that have been studied thoroughly and appear nonepileptic in nature per Dr Terrace Arabia (neurology) The patient has had episodes starting in 2007 that initially began 1-4 times a day during which time she would have periods of unawareness followed by confusion. This continuous video EEG monitoring performed from October 8-12, 2007. Had   . Urethra dilated and patulous 07/03/2015   Patulous, Dilated Urethra. 5mL balloon on a 22-Fr catheter hoping this would prevent the bladder spasm from pushing the balloon down the urethra (Dr Logan Bores, Urology [facility], July 2016)    LMP 04/05/2011   Opioid Risk Score:   Fall Risk Score:  `1  Depression screen PHQ 2/9  Depression screen [facility] 2/9 12/23/2017 01/08/2016  Decreased Interest 1 0  Down, Depressed, Hopeless 1 0  PHQ - 2 Score 2 0  Some recent data might be hidden     Review of Systems  Constitutional: Negative.   HENT: Negative.   Eyes: Negative.   Respiratory: Negative.   Cardiovascular: Negative.   Gastrointestinal: Negative.   Endocrine: Negative.   Genitourinary: Negative.   Musculoskeletal: Negative.   Skin: Negative.   Allergic/Immunologic: Negative.   Neurological: Negative.   Hematological: Negative.   Psychiatric/Behavioral: Negative.        Objective:   Physical Exam  Constitutional: She appears well-developed and well-nourished.  No distress.  HENT:  Head: Normocephalic and atraumatic.  Neurological: She is alert.  Motor strength is 3- in the right deltoid 2- at the bicep and tricep trace wrist and finger extensors 2- finger flexors   Skin: Skin is warm and dry. She is not diaphoretic.  Nursing note and vitals reviewed.   Tone Modified Ashworth score Right elbow flexor 2 Right wrist flexors 2 Right finger flexors 2 Right pronators 2       Assessment & Plan:  #1.  Spastic quadriparesis secondary to cerebral palsy.  She has multifocal spasticity and treatments have focused on the upper  extremities.  Most recently the right upper extremity.  Her tone has been reduced by one on the modified Ashworth scale which is the normal degree of relaxation.  After discussion with patient she would like to repeat this injection.  We will schedule her back in approximately 6 weeks.  Will use same dosing and muscle group selection  RIGHT Biceps200 FCR200 FCU200 FDP100 FDS 100 Pronator teres 100 Pronator quad 100

## 2017-12-24 ENCOUNTER — Ambulatory Visit (HOSPITAL_COMMUNITY)
Admission: RE | Admit: 2017-12-24 | Discharge: 2017-12-24 | Disposition: A | Discharge status: Home / Self Care | Payer: No Typology Code available for payment source | Source: Ambulatory Visit | Attending: Internal Medicine | Admitting: Internal Medicine

## 2017-12-24 DIAGNOSIS — R1311 Dysphagia, oral phase: Secondary | ICD-10-CM | POA: Insufficient documentation

## 2017-12-24 DIAGNOSIS — T17320A Food in larynx causing asphyxiation, initial encounter: Secondary | ICD-10-CM | POA: Diagnosis not present

## 2017-12-24 DIAGNOSIS — R1312 Dysphagia, oropharyngeal phase: Secondary | ICD-10-CM | POA: Diagnosis not present

## 2017-12-24 DIAGNOSIS — I10 Essential (primary) hypertension: Secondary | ICD-10-CM | POA: Insufficient documentation

## 2017-12-24 DIAGNOSIS — Z9049 Acquired absence of other specified parts of digestive tract: Secondary | ICD-10-CM | POA: Insufficient documentation

## 2017-12-24 DIAGNOSIS — Z86711 Personal history of pulmonary embolism: Secondary | ICD-10-CM | POA: Insufficient documentation

## 2017-12-24 DIAGNOSIS — R569 Unspecified convulsions: Secondary | ICD-10-CM | POA: Diagnosis not present

## 2017-12-24 DIAGNOSIS — Z7901 Long term (current) use of anticoagulants: Secondary | ICD-10-CM | POA: Diagnosis not present

## 2017-12-24 DIAGNOSIS — R1310 Dysphagia, unspecified: Secondary | ICD-10-CM

## 2017-12-24 DIAGNOSIS — F429 Obsessive-compulsive disorder, unspecified: Secondary | ICD-10-CM | POA: Insufficient documentation

## 2017-12-24 DIAGNOSIS — K219 Gastro-esophageal reflux disease without esophagitis: Secondary | ICD-10-CM | POA: Insufficient documentation

## 2017-12-24 DIAGNOSIS — G800 Spastic quadriplegic cerebral palsy: Secondary | ICD-10-CM | POA: Diagnosis not present

## 2017-12-24 NOTE — Progress Notes (Signed)
Modified Barium Swallow Progress Note  Patient Details  Name: [name] SlickerJudith [name] MRN: [id number] Date of Birth: 1976-11-14  Today's Date: 12/24/2017  Modified Barium Swallow completed.  Full report located under Chart Review in the Imaging Section.  Brief recommendations include the following:  Clinical Impression  Pt presents with a moderate to severe oral and oropharyngeal dysphagia secondary to both spastic quadriplegia with decreased motor coordination and ACDF hardware located at CP segment. Pt prefers straw sips, but oral control of sip size and bolus formation is variable due to motor impairment. Pt reports she typically takes large sips, which in this study results in premature spillage and piecemealing of bolus. Oral discoordination contributes to impaired pharyngeal timing. Liquids hitting the pyriforms prior to UES opening (where hardware begins) intermittenlty spill into larynx before/during the swallow. Even with smaller  thin straw sips or spoon sips silent trace penetration and aspiration events occur, though the largest quantities of silent aspiration were volume dependent. Pt could not achieve a chin tuck due to decreased mobility and could not create a labial seal with a cup. Though pt reports she will refuse nectar thick liquids, these were trialed with ongoing mild penetration and aspiration, but significantly decreased in severity even with large boluses. Puree, solids and pill whole in solids were tolerated without incident. Strength of hyolaryngeal complex is adequate, impairments are in motor control, coordination and sensation. Cued cough and throat clear is not strong/efficient enough to fully eject penetrate, so this could be area to target in therapy to improve respiratory hygiene, as well as oral care.   Swallow Evaluation Recommendations       SLP Diet Recommendations: Other (Comment)(Per pt and providers)       Medication Administration: Whole meds with puree               Oral Care Recommendations: Oral care before and after PO(Consider oral care protocol)       Harlon DittyBonnie Cavin Longman, MA CCC-SLP [phone]  [name] [name], Riley NearingBonnie [name] 12/24/2017,12:20 PM

## 2017-12-25 DIAGNOSIS — N319 Neuromuscular dysfunction of bladder, unspecified: Secondary | ICD-10-CM | POA: Diagnosis not present

## 2017-12-26 DIAGNOSIS — M4180 Other forms of scoliosis, site unspecified: Secondary | ICD-10-CM | POA: Diagnosis not present

## 2017-12-26 DIAGNOSIS — R1319 Other dysphagia: Secondary | ICD-10-CM | POA: Diagnosis not present

## 2017-12-31 DIAGNOSIS — R6889 Other general symptoms and signs: Secondary | ICD-10-CM | POA: Diagnosis not present

## 2017-12-31 DIAGNOSIS — R110 Nausea: Secondary | ICD-10-CM | POA: Diagnosis not present

## 2017-12-31 DIAGNOSIS — M4143 Neuromuscular scoliosis, cervicothoracic region: Secondary | ICD-10-CM | POA: Diagnosis not present

## 2017-12-31 DIAGNOSIS — Z981 Arthrodesis status: Secondary | ICD-10-CM | POA: Diagnosis not present

## 2017-12-31 DIAGNOSIS — Z9689 Presence of other specified functional implants: Secondary | ICD-10-CM | POA: Diagnosis not present

## 2017-12-31 DIAGNOSIS — G800 Spastic quadriplegic cerebral palsy: Secondary | ICD-10-CM | POA: Diagnosis not present

## 2017-12-31 DIAGNOSIS — M4184 Other forms of scoliosis, thoracic region: Secondary | ICD-10-CM | POA: Diagnosis not present

## 2018-01-05 DIAGNOSIS — R5383 Other fatigue: Secondary | ICD-10-CM | POA: Diagnosis not present

## 2018-01-07 DIAGNOSIS — F422 Mixed obsessional thoughts and acts: Secondary | ICD-10-CM | POA: Diagnosis not present

## 2018-01-08 DIAGNOSIS — G800 Spastic quadriplegic cerebral palsy: Secondary | ICD-10-CM | POA: Diagnosis not present

## 2018-01-08 DIAGNOSIS — N319 Neuromuscular dysfunction of bladder, unspecified: Secondary | ICD-10-CM | POA: Diagnosis not present

## 2018-01-09 DIAGNOSIS — D508 Other iron deficiency anemias: Secondary | ICD-10-CM | POA: Diagnosis not present

## 2018-01-09 DIAGNOSIS — R5383 Other fatigue: Secondary | ICD-10-CM | POA: Diagnosis not present

## 2018-01-09 DIAGNOSIS — R1319 Other dysphagia: Secondary | ICD-10-CM | POA: Diagnosis not present

## 2018-01-21 DIAGNOSIS — G8929 Other chronic pain: Secondary | ICD-10-CM | POA: Diagnosis not present

## 2018-01-21 DIAGNOSIS — I2699 Other pulmonary embolism without acute cor pulmonale: Secondary | ICD-10-CM | POA: Diagnosis not present

## 2018-01-21 DIAGNOSIS — G808 Other cerebral palsy: Secondary | ICD-10-CM | POA: Diagnosis not present

## 2018-01-21 DIAGNOSIS — K5909 Other constipation: Secondary | ICD-10-CM | POA: Diagnosis not present

## 2018-01-24 ENCOUNTER — Emergency Department (HOSPITAL_COMMUNITY)
Admission: EM | Admit: 2018-01-24 | Discharge: 2018-01-25 | Disposition: A | Discharge status: Home / Self Care | Payer: Medicare Other | Attending: Emergency Medicine | Admitting: Emergency Medicine

## 2018-01-24 ENCOUNTER — Encounter (HOSPITAL_COMMUNITY): Payer: Self-pay | Admitting: *Deleted

## 2018-01-24 DIAGNOSIS — T83198A Other mechanical complication of other urinary devices and implants, initial encounter: Secondary | ICD-10-CM | POA: Diagnosis not present

## 2018-01-24 DIAGNOSIS — Z7901 Long term (current) use of anticoagulants: Secondary | ICD-10-CM | POA: Insufficient documentation

## 2018-01-24 DIAGNOSIS — T83510A Infection and inflammatory reaction due to cystostomy catheter, initial encounter: Secondary | ICD-10-CM | POA: Insufficient documentation

## 2018-01-24 DIAGNOSIS — T83010A Breakdown (mechanical) of cystostomy catheter, initial encounter: Secondary | ICD-10-CM | POA: Insufficient documentation

## 2018-01-24 DIAGNOSIS — Y732 Prosthetic and other implants, materials and accessory gastroenterology and urology devices associated with adverse incidents: Secondary | ICD-10-CM | POA: Insufficient documentation

## 2018-01-24 DIAGNOSIS — I10 Essential (primary) hypertension: Secondary | ICD-10-CM | POA: Insufficient documentation

## 2018-01-24 DIAGNOSIS — Z79899 Other long term (current) drug therapy: Secondary | ICD-10-CM | POA: Insufficient documentation

## 2018-01-24 DIAGNOSIS — N390 Urinary tract infection, site not specified: Secondary | ICD-10-CM | POA: Diagnosis not present

## 2018-01-24 DIAGNOSIS — G809 Cerebral palsy, unspecified: Secondary | ICD-10-CM | POA: Insufficient documentation

## 2018-01-24 NOTE — ED Notes (Signed)
Bed: ZO10WA19 Expected date:  Expected time:  Means of arrival:  Comments: 42 yo F/Suprapubic cath issue

## 2018-01-24 NOTE — ED Triage Notes (Signed)
Pt is from facility with c/o suprabupric catheter is occluded, leaking, needs replacement. Pt says she is suppose to have it flushed 3 times a week but it has not and now it is occluded, c/o spasms.

## 2018-01-25 LAB — URINALYSIS, ROUTINE W REFLEX MICROSCOPIC
Bilirubin Urine: NEGATIVE
Glucose, UA: NEGATIVE mg/dL
Ketones, ur: NEGATIVE mg/dL
Nitrite: POSITIVE — AB
PROTEIN: NEGATIVE mg/dL
Specific Gravity, Urine: 1.01 (ref 1.005–1.030)
pH: 8 (ref 5.0–8.0)

## 2018-01-25 LAB — I-STAT CHEM 8, ED
BUN: 13 mg/dL (ref 6–20)
CALCIUM ION: 1.12 mmol/L — AB (ref 1.15–1.40)
CHLORIDE: 99 mmol/L — AB (ref 101–111)
Creatinine, Ser: 0.5 mg/dL (ref 0.44–1.00)
Glucose, Bld: 95 mg/dL (ref 65–99)
HCT: 36 % (ref 36.0–46.0)
HEMOGLOBIN: 12.2 g/dL (ref 12.0–15.0)
POTASSIUM: 3.9 mmol/L (ref 3.5–5.1)
SODIUM: 138 mmol/L (ref 135–145)
TCO2: 31 mmol/L (ref 22–32)

## 2018-01-25 MED ORDER — CEFTRIAXONE SODIUM 1 G IJ SOLR
1.0000 g | Freq: Once | INTRAMUSCULAR | Status: AC
  Administered 2018-01-25: 1 g via INTRAMUSCULAR
  Filled 2018-01-25: qty 10

## 2018-01-25 MED ORDER — CEPHALEXIN 500 MG PO CAPS: 500.0000 mg | ORAL_CAPSULE | Freq: Three times a day (TID) | ORAL | 0 refills | Status: DC

## 2018-01-25 MED ORDER — LIDOCAINE HCL 1 % IJ SOLN
INTRAMUSCULAR | Status: AC
  Administered 2018-01-25: 1.2 mL
  Filled 2018-01-25: qty 20

## 2018-01-25 NOTE — ED Notes (Signed)
ptar notified of transport

## 2018-01-25 NOTE — Discharge Instructions (Signed)
You were seen today for replacement of her suprapubic catheter.  Flush as directed by her urologist.  [name] QuinYou will also be treated for UTI.  If you develop any flank pain or fevers you should be reevaluated.

## 2018-01-25 NOTE — ED Provider Notes (Signed)
[name] Provider Note   CSN: [id number] Arrival date & time: 01/24/18  2310     History   Chief Complaint Chief Complaint  Patient presents with  . Suprabupic Catheter Problem    HPI Porfiria Heinrich is a 42 y.o. female.  HPI  This 42 year old female with a history of cerebral palsy, chronic abdominal pain, chronic indwelling superpubic catheter who presents with decreased urine output.  Patient reports 2-day history of decreased output from her catheter.  She reports that she saw her urologist 2 weeks ago for the same thing.  Catheter was replaced.  She states that she is supposed to have her catheter flushed 3 times per week but this is not happening.  She denies any back pain or fevers.  She does report that she is having overflow urination through the catheter site as well as through her urethra.  Patient denies any abdominal pain, nausea, vomiting.  Past Medical History:  Diagnosis Date  . Abdominal pain 01/02/2012   Overview:  Overview:  Per Previous pcp note- Dr. Ta--Pt has history of endometriosis and had DUB in 2011.  She She had u/s that showed normal endometrial stripe.  She had endometrial bx that was wnl.  She has a lot of abd cramping every month.  This cramping was sometimes not relieved with hydrocodone.  She was referred to [facility] for IUD placement to help endometriosis and abd cramping.  [name]  . Abdominal pain 01/02/2012   Overview:  Overview:  Per Previous pcp note- Dr. Ta--Pt has history of endometriosis and had DUB in 2011.  She She had u/s that showed normal endometrial stripe.  She had endometrial bx that was wnl.  She has a lot of abd cramping every month.  This cramping was sometimes not relieved with hydrocodone.  She was referred to [facility] for IUD placement to help endometriosis and abd cramping.  [name] was placed 04/30/11 by [facility].  Pt still complains of abd cramping, which I am treating with hydrocodone and  ultram. Pt reports some initial relief of pain from IUD, but states it has now returned back to similar to previous cramping. Gave patient a shot of Toradol 60 mg IM today-since positive exacerbation of abdominal cramping during past 2 weeks. Physical exam reassuring. Patient may need to return [facility] workup since she is plugged in with the GYN providers there for further treatment options regarding endometriosis and abdominal cramping. The dysfunctional uterine bleeding now well contr  . Acute cystitis with hematuria   . Acute cystitis without hematuria   . Acute respiratory failure with hypoxia ([name])   . Anemia   . Cellulitis 12/22/2014  . Cerebral palsy ([name])    Spastic Cerebral palsy, mentally intact  . Contracture, joint, multiple sites    Electric wheelchair, uses left hand to operate chair.   . Depression   . Disease of female genital organs 10/24/2010   Overview:  Overview:  Pt has history of endometriosis and had DUB in 2011.  She had u/s that showed normal endometrial stripe.  She had endometrial bx that was wnl.  She has a lot of abd cramping every month.  This cramping was sometimes not relieved with hydrocodone.  She was referred to [facility] for IUD placement to help endometriosis and abd cramping.  [name] was placed 04/30/11 by [facility].  Pt still complains of abd cramping, which was treated with hydrocodone and ultram.   Last seen at [facility]  by OB/GYN 12/24/11, given Depo Lupron injection.  Ordering CT scan to r/o additional cause of abdominal pain (pt with known endometriosis and will not tolerate vaginal U/S, pain not improved s/p [name] or with Lupron injection-- only caused hot flashes and amenorrhea, no pain relief).  Also made referral to pain clinic for better pain control.  Do not think hysterectomy would help with pain, and concern for contamination of baclofen pump if did proceed with surgery.    8/5/13[name]: Pt was seen by   . DJD (degenerative  joint disease)   . Dysarthria   . Dyspnea and respiratory abnormality 02/04/2011   Overview:  Overview:  Pt has history of recurrent PE and is on chronic coumadin.  Pt has been complaining of dyspnea that she describes as chest tightness.  She has been to the ER multiple times for this.  Usually she gets CTA and serial CE.  She has had at least 6 CTA in a period of since 2011.  She also has an O2 requirement. It is unclear why pt feels dyspneic, complains of chest tightness, or has O2 requirement.  I thought that her complaint may be from deconditioning and referred her to PT.  Unfortunately Medicaid only pays for 4 PT visits.  Pt should be doing these exercises at home, but she has not.  I have referred her to [facility], and she has been seen by [name].  He did not understand the etiology of her dyspnea and O2 requirement.  He will continue to follow her.   Last Assessment & Plan:  Pt presents with concerns for chest tightness and dyspnea.  She was seen in ED yesterday and discharged home.  While there she had a CT chest 04/20/11 showed 1.  Multifocal degradation as detailed above.  N  . Endometriosis   . GERD (gastroesophageal reflux disease)   . Gram positive sepsis ([name]) 10/24/2014  . Gross hematuria 03/12/2013   Overview:  Overview:  status post replacement of suprapubic tube 03/04/2013  Last Assessment & Plan:  Recent urine culture collected in ED on 5/4 did not result in significant growth. Moreover, patient has been asymptomatic with normal WBC, no fever and no true symptoms making symptomatic UTI less likely.  Will continue to monitor for any sign of infection.  Patient's xarelto had been held during her recent hospitalization due to hematuria. This has since been re-started. Monitor bleeding.    [name] Kitchen HCAP (healthcare-associated pneumonia) 10/25/2014  . HCAP (healthcare-associated pneumonia) 11/24/2017  . History of anticoagulant therapy 10/21/2012  . History of endometriosis 10/2012   OBGYN [facility]:  Laparoscopic Endometrial Ablation, TVH,  . History of recurrent UTIs   . Hypertension   . HYPERTENSION, BENIGN 01/31/2010   Qualifier: Diagnosis of  By: [name], MD, [name] Incontinent of feces 03/11/2016  . Infantile cerebral palsy ([name]) 01/29/2007   Overview:  She has spastic CP.  She is mentally intact.  She is wheelchair bound.  She is wheelchair bound for ambulation      . Intracervical pessary 05/07/2011   Overview:  Overview:  Placed by [facility] 04/30/11 to treat DUB and Endometriosis.  She is seen at GYN clinic at [facility] but was considered a poor surgical candidate and referred her to [facility].  For DUB she had pelvic ultrasound that showed thin stripe and she had endometrial Bx by Dr Jennette Kettle in GYN clinic, which was negative.     . Macroscopic hematuria 03/10/2013  status post replacement of suprapubic tube 03/04/2013   . Migraine   . OCD (obsessive compulsive disorder)   . Parapneumonic effusion 10/25/2014  . Presence of intrathecal baclofen pump 03/07/2014   R LQ. Insertion T10.    [name] Kitchen Psychiatric illness 10/31/2014  . Pulmonary embolism ([name])    Lifetime Coumadin  . Pulmonary embolism ([name]) 2011, 01/2011   Will be on lifetime coumadin  . Recurrent pulmonary embolism ([name]) 05/03/2011   Pt has history of recurrent PE and is on chronic coumadin.  Pt has been complaining of dyspnea that she describes as chest tightness.  She has been to the ER multiple times for this.  Usually she gets CTA and serial CE.  She has had at least 6 CTA in a period of since 2011.  She also has an O2 requirement. It is unclear why pt feels dyspneic, complains of chest tightness, or has O2 requirement.  I thought that her complaint may be from deconditioning and referred her to PT.  Unfortunately Medicaid only pays for 4 PT visits.  Pt should be doing these exercises at home, but she has not.  I have referred her to [facility], and she has been seen by [name].  He did not understand the etiology of her  dyspnea and O2 requirement.  He will continue to follow her.     . Seborrheic keratosis 01/18/2011  . Seizures ([name]) 02/12/2016  . Spasticity 01/05/2014  . Transient alteration of awareness, recurrent 09/08/2006   Recurrent episodes where Taleya  loses contact with her world and that have been studied thoroughly and appear nonepileptic in nature per Dr Terrace Arabia (neurology) The patient has had episodes starting in 2007 that initially began 1-4 times a day during which time she would have periods of unawareness followed by confusion. This continuous video EEG monitoring performed from October 8-12, 2007. Had   . Urethra dilated and patulous 07/03/2015   Patulous, Dilated Urethra. 5mL balloon on a 22-Fr catheter hoping this would prevent the bladder spasm from pushing the balloon down the urethra (Dr Logan Bores, Urology [facility], July 2016)     Patient Active Problem List   Diagnosis Date Noted  . Incontinent of feces 03/11/2016  . Chronic anticoagulation 03/11/2016  . Involuntary movements 02/22/2016  . Seizures ([name]) 02/12/2016  . Dyspnea 02/12/2016  . Cerebral palsy, quadriplegic ([name]) 02/06/2016  . Swelling of extremity 01/13/2016  . Pressure ulcer 10/09/2015  . Status post insertion of inferior vena caval filter 09/28/2015  . Absence of bladder continence 08/25/2015  . Person living in residential institution 06/14/2015  . Pulmonary hypertension ([name])   . Anemia of chronic disease   . Spasticity 03/15/2015  . Constipation, chronic 10/10/2014  . Athetoid cerebral palsy ([name]) 04/13/2014  . Dystonia 04/12/2014  . UTI (lower urinary tract infection) 03/16/2014  . Presence of intrathecal baclofen pump 03/07/2014  . Dental caries 07/18/2013  . Multiple thyroid nodules 05/20/2013  . Osteoarthrosis 04/07/2013  . [name]   . Dysarthria   . GERD (gastroesophageal reflux disease)   . Neurogenic bladder 02/05/2013  . Anxiety state 10/21/2012  . Chronic pain syndrome 06/17/2012    . Recurrent pulmonary embolism ([name]) 05/03/2011  . Pulmonary embolism with infarction ([name]) 05/03/2011  . Chronic suprapubic catheter ([name]) 03/15/2011  . Disease of female genital organs 10/24/2010  . HYPERTENSION, BENIGN 01/31/2010  . CP (cerebral palsy), spastic ([name]) 12/27/2008  . Contracted, joint, multiple sites 12/27/2008  . Major depressive disorder, recurrent episode ([name]) 01/29/2007  .  Obsessive Compulsive Disorder 01/29/2007  . Transient alteration of awareness, recurrent 09/08/2006    Past Surgical History:  Procedure Laterality Date  . ABDOMINAL HYSTERECTOMY    . APPENDECTOMY    . BACLOFEN PUMP REFILL     x 3 times  . CARPAL TUNNEL RELEASE  08/2008   Dr Teressa SenterSypher  . CESAREAN SECTION     x 2  . CHOLECYSTECTOMY  10/14/2015   Procedure: LAPAROSCOPIC CHOLECYSTECTOMY;  Surgeon: Violeta GelinasBurke Thompson, MD;  [facility];  Service: General;;  . COLPOSCOPY  06/2000  . INTRAUTERINE DEVICE INSERTION  04/30/11   [facility]  . LAPAROSCOPIC ASSISTED VAGINAL HYSTERECTOMY  10/27/2012  . MULTIPLE EXTRACTIONS WITH ALVEOLOPLASTY Bilateral 07/19/2013   Procedure: EXTRACTIONS #4, 2,13,08,655,11,16,19;  Surgeon: Francene Findershristopher L Ector, DDS;  [facility];  Service: Oral Surgery;  Laterality: Bilateral;  . NEUROMA SURGERY Left    anterior and posterior intersosseous  . PAIN PUMP IMPLANTATION N/A 03/07/2014   Procedure: baclofen pump revision/replacement and Catheter connection replacement;  Surgeon: Cristi LoronJeffrey D Jenkins, MD;  Location: [facility] NEURO ORS;  Service: Neurosurgery;  Laterality: N/A;  baclofen pump revision/replacement and Catheter connection replacement  . PROGRAMABLE BACLOFEN PUMP REVISION  03/17/14   Battery Replacement   . TUBAL LIGATION  2003  . URETHRA SURGERY    . WRIST SURGERY  06/2010   Dr Dierdre SearlesLi[facility]    [facility] History    No data available       Home Medications    Prior to Admission medications   Medication Sig Start Date End Date Taking?  Authorizing Provider  amantadine (SYMMETREL) 100 MG capsule Take 100 mg by mouth daily.    Yes [provider]  baclofen (LIORESAL) 20 MG tablet Take 20 mg by mouth 4 (four) times daily. For bladder spasms   Yes [provider]  clonazePAM (KLONOPIN) 0.5 MG tablet Take 1.5 tablets (0.75 mg total) by mouth 3 (three) times daily. 11/27/17  Yes Narda BondsNettey, Ralph A, MD  fentaNYL (DURAGESIC - DOSED MCG/HR) 12 MCG/HR Place 1 patch (12.5 mcg total) onto the skin every 3 (three) days. 11/27/17  Yes Narda BondsNettey, Ralph A, MD  ferrous sulfate 325 (65 FE) MG tablet Take 325 mg by mouth daily with breakfast.   Yes [provider]  FLUoxetine HCl 60 MG TABS Take 60 mg by mouth daily.    Yes [provider]  lactose free nutrition (BOOST) LIQD Take 237 mLs by mouth 2 (two) times daily between meals.   Yes [provider]  lamoTRIgine (LAMICTAL) 100 MG tablet Take 100 mg by mouth at bedtime.   Yes [provider]  lubiprostone (AMITIZA) 24 MCG capsule Take 1 capsule (24 mcg total) by mouth 2 (two) times daily with a meal. 01/01/17  Yes Esterwood, [name] S, PA-C  Melatonin 5 MG TABS Take 5 mg by mouth at bedtime.    Yes [provider]  Multiple Vitamins-Minerals (CERTA-VITE PO) Take 1 tablet by mouth daily.   Yes [provider]  OVER THE COUNTER MEDICATION Take 1 Package by mouth 3 (three) times daily. Magic cup   Yes [provider]  oxybutynin (DITROPAN) 5 MG tablet Take 5 mg by mouth 4 (four) times daily.  02/20/16  Yes [provider]  OxyCODONE HCl, Abuse Deter, (OXAYDO) 5 MG TABA Take 5 mg by mouth every 4 (four) hours as needed (Pain).   Yes [provider]  polyethylene glycol powder (GLYCOLAX/MIRALAX) powder Take 1  1/2 dose  daily in 12 ounces of fluid every day. Patient taking differently: Take 17 g by mouth daily.  01/01/17  Yes Esterwood, [name] S, PA-C  QUEtiapine (SEROQUEL) 100 MG tablet Take 1 tablet (100 mg total) by  mouth at bedtime. 06/21/15  Yes Tyrone Nine, MD  tiZANidine (ZANAFLEX) 4 MG capsule Take 4 mg by mouth 2 (two) times daily.    Yes [provider]  warfarin (COUMADIN) 6 MG tablet Take 6 mg by mouth daily.   Yes [provider]  acetaminophen (TYLENOL) 500 MG tablet Take 500-650 mg by mouth See admin instructions. 500mg  three times daily and 650 three times daily as needed for pain (do not exceed 4000mg  in 24hrs)    [provider]  cephALEXin (KEFLEX) 500 MG capsule Take 1 capsule (500 mg total) by mouth 3 (three) times daily. 01/25/18   Anise Harbin, Mayer Masker, MD  HYDROmorphone (DILAUDID) 2 MG tablet Take 1 tablet (2 mg total) by mouth every 4 (four) hours as needed for severe pain. Patient not taking: Reported on 01/25/2018 11/27/17   Narda Bonds, MD    Family History Family History  Problem Relation Age of Onset  . Asthma Father   . Colon cancer Maternal Grandmother        Died in her 97's  . Breast cancer Paternal Grandmother        Died in her 29's  . Heart attack Maternal Grandfather        Died in his 67's  . Alzheimer's disease Paternal Grandfather        Died in his 9's  . Breast cancer Mother   . Stomach cancer Neg Hx     Social History Social History   Tobacco Use  . Smoking status: Never Smoker  . Smokeless tobacco: Never Used  Substance Use Topics  . Alcohol use: Yes    Alcohol/week: 0.6 oz    Types: 1 Glasses of wine per week    Comment: Drinks alcohol once a month.  . Drug use: No     Allergies   Morphine   Review of Systems Review of Systems  Constitutional: Negative for fever.  Respiratory: Negative for shortness of breath.   Cardiovascular: Negative for chest pain.  Gastrointestinal: Negative for abdominal pain, nausea and vomiting.  Genitourinary: Negative for flank pain.  Musculoskeletal: Negative for back pain.  All other systems reviewed and are negative.    Physical Exam Updated Vital Signs BP 119/80 (BP  Location: Left Arm)   Pulse 78   Temp (!) 97.5 F (36.4 C) (Oral)   Resp 18   Ht 4\' 10"  (1.473 m)   Wt 44.5 kg (98 lb)   LMP 04/05/2011   SpO2 96%   BMI 20.48 kg/m   Physical Exam  Constitutional: She is oriented to person, place, and time.  Chronically ill-appearing, thin, frail, no acute distress  HENT:  Head: Normocephalic and atraumatic.  Cardiovascular: Normal rate, regular rhythm and normal heart sounds.  Pulmonary/Chest: Effort normal and breath sounds normal. No respiratory distress. She has no wheezes.  Abdominal: Soft. Bowel sounds are normal. There is no tenderness. There is no guarding.  Pump hardware palpated right lower abdomen  Genitourinary:  Genitourinary Comments: Suprapubic catheter in place, minimal drainage into catheter bag, urine leakage noted around catheter site, otherwise no significant erythema  Musculoskeletal:  Chronic contractures  Neurological: She is alert and oriented to person, place, and time.  Speech slurring  Skin: Skin is  warm and dry.  Psychiatric: She has a normal mood and affect.  Nursing note and vitals reviewed.    ED Treatments / Results  Labs (all labs ordered are listed, but only abnormal results are displayed) Labs Reviewed  URINALYSIS, ROUTINE W REFLEX MICROSCOPIC - Abnormal; Notable for the following components:      Result Value   APPearance CLOUDY (*)    Hgb urine dipstick MODERATE (*)    Nitrite POSITIVE (*)    Leukocytes, UA LARGE (*)    Bacteria, UA MANY (*)    Squamous Epithelial / LPF 0-5 (*)    All other components within normal limits  I-STAT CHEM 8, ED - Abnormal; Notable for the following components:   Chloride 99 (*)    Calcium, Ion 1.12 (*)    All other components within normal limits  URINE CULTURE    EKG  EKG Interpretation None       Radiology No results found.  Procedures Suprapubic catheter replacement Date/Time: 01/25/2018 12:46 AM Performed by: Shon Baton, MD Authorized by:  Shon Baton, MD  Risks and benefits: risks, benefits and alternatives were discussed Consent given by: patient Preparation: Patient was prepped and draped in the usual sterile fashion. Local anesthesia used: no  Anesthesia: Local anesthesia used: no  Sedation: Patient sedated: no  Patient tolerance: Patient tolerated the procedure well with no immediate complications Comments: 16 French catheter replaced without incident, return of urine    (including critical care time)  Medications Ordered in ED Medications  cefTRIAXone (ROCEPHIN) injection 1 g (1 g Intramuscular Given 01/25/18 0205)  lidocaine (XYLOCAINE) 1 % (with pres) injection (1.2 mLs  Given 01/25/18 0205)     Initial Impression / Assessment and Plan / ED Course  I have reviewed the triage vital signs and the nursing notes.  Pertinent labs & imaging results that were available during my care of the patient were reviewed by me and considered in my medical decision making (see chart for details).     Patient presents with catheter not flushing.  She has urine leakage around the catheter site.  Catheter was replaced without difficulty.  She has good urine output.  BMP was obtained to assess kidney function.  This is reassuring.  Urinalysis does show nitrite positive urine with too numerous to count white cells and many bacteria.  This is in the setting of chronic indwelling catheter; however, urine appears purulent.  Urine culture was sent.  Patient was given Rocephin and will be discharged with Keflex based on prior culture data.  She does not appear septic at this time.  After history, exam, and medical workup I feel the patient has been appropriately medically screened and is safe for discharge home. Pertinent diagnoses were discussed with the patient. Patient was given return precautions.   Final Clinical Impressions(s) / ED Diagnoses   Final diagnoses:  Suprapubic catheter dysfunction, initial encounter [facility])    Urinary tract infection associated with cystostomy catheter, initial encounter [facility])    ED Discharge Orders        Ordered    cephALEXin (KEFLEX) 500 MG capsule  3 times daily     01/25/18 0205       Shon Baton, MD 01/25/18 [phone]

## 2018-01-26 ENCOUNTER — Ambulatory Visit (INDEPENDENT_AMBULATORY_CARE_PROVIDER_SITE_OTHER): Payer: Medicare Other | Admitting: Pediatrics

## 2018-01-26 ENCOUNTER — Encounter (INDEPENDENT_AMBULATORY_CARE_PROVIDER_SITE_OTHER): Payer: Self-pay | Admitting: Pediatrics

## 2018-01-26 ENCOUNTER — Encounter (INDEPENDENT_AMBULATORY_CARE_PROVIDER_SITE_OTHER): Payer: Medicare Other | Admitting: Pediatrics

## 2018-01-26 DIAGNOSIS — G803 Athetoid cerebral palsy: Secondary | ICD-10-CM

## 2018-01-26 DIAGNOSIS — G801 Spastic diplegic cerebral palsy: Secondary | ICD-10-CM

## 2018-01-26 DIAGNOSIS — N390 Urinary tract infection, site not specified: Secondary | ICD-10-CM | POA: Diagnosis not present

## 2018-01-26 DIAGNOSIS — N898 Other specified noninflammatory disorders of vagina: Secondary | ICD-10-CM | POA: Diagnosis not present

## 2018-01-26 DIAGNOSIS — N318 Other neuromuscular dysfunction of bladder: Secondary | ICD-10-CM | POA: Diagnosis not present

## 2018-01-27 LAB — URINE CULTURE: Culture: >=100000 — AB

## 2018-01-27 NOTE — Progress Notes (Signed)
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 female who returns today to empty refill and reprogram her intrathecal baclofen pump.  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  Over time I've gradually increased her baclofen and response to complaints of decreased mobility and spasticity.  She has responded by independently ambulating, most recently up to 450 feet (180 steps), being able to get in and out of her wheelchair with less assistance, to dress and feed herself more independently.  Periodically she has episodes of blackouts.  She did not address any episodes of blackouts today.  These are usually present when she is under a lot of stress and I think are not epileptic.  She has a number of relationships that are bisexual.  Unfortunately most people use her relationships to gain things that they want and then leave her.  This happened recently on Valentine's Day.  She is adamant that she is moving out of her group home into independent living and that the CNA will supervise her.  She also told me that her mother has stage IV breast cancer and is seriously ill.  Procedure: Reprogramming intrathecal baclofen pump  The device was interrogated and showed a complex continuous infusion of baclofen that has a basal rate of 20.68mcg per hour. The patient receives bolus doses of 27 mcg delivered at midnight, 6 AM, 12 noon, and 6 PM, for total of 514. per day. I did not need to reprogram the infusion rate today.  The patient was sterilely prepped and draped. A 1-1/2 inch 22-gauge noncoring Huebner needle was inserted on  the first pass. The apex of the reservoir is going horizontally to the left. 4.30mL was withdrawn from the pump placing it under partial vacuum. 40 mL of baclofen (concentration 2000 mcg/mL) was instilled into the pump through a Millipore filter. She tolerated the procedure well.  Her reservoir alarm date is June 26, 2018.  ERI is 34 months.  Review of Systems: A complete review of systems was assessed and noted is negative except as above and below.  Past Medical History Diagnosis Date  . Abdominal pain 01/02/2012   Overview:  Overview:  Per Previous pcp note- Dr. Ta--Pt has history of endometriosis and had DUB in 2011.  She She had u/s that showed normal endometrial stripe.  She had endometrial bx that was wnl.  She has a lot of abd cramping every month.  This cramping was sometimes not relieved with hydrocodone.  She was referred to [facility] for IUD placement to help endometriosis and abd cramping.  [name]  . Abdominal pain 01/02/2012   Overview:  Overview:  Per Previous pcp note- Dr. Ta--Pt has history of endometriosis and had DUB in 2011.  She She had u/s that showed normal endometrial stripe.  She had endometrial bx that was wnl.  She has a lot of abd cramping every month.  This cramping was sometimes not relieved with hydrocodone.  She was referred to [facility] for IUD placement to help endometriosis and abd cramping.  [name] was placed 04/30/11 by [facility].  Pt still complains of abd cramping, which I am treating with hydrocodone and ultram. Pt reports some initial relief of pain  from IUD, but states it has now returned back to similar to previous cramping. Gave patient a shot of Toradol 60 mg IM today-since positive exacerbation of abdominal cramping during past 2 weeks. Physical exam reassuring. Patient may need to return [facility] further workup since she is plugged in with the GYN providers there for further treatment options regarding endometriosis and abdominal cramping. The  dysfunctional uterine bleeding now well contr  . Acute cystitis with hematuria   . Acute cystitis without hematuria   . Acute respiratory failure with hypoxia ([name])   . Anemia   . Cellulitis 12/22/2014  . Cerebral palsy ([name])    Spastic Cerebral palsy, mentally intact  . Contracture, joint, multiple sites    Electric wheelchair, uses left hand to operate chair.   . Depression   . Disease of female genital organs 10/24/2010   Overview:  Overview:  Pt has history of endometriosis and had DUB in 2011.  She had u/s that showed normal endometrial stripe.  She had endometrial bx that was wnl.  She has a lot of abd cramping every month.  This cramping was sometimes not relieved with hydrocodone.  She was referred to [facility] for IUD placement to help endometriosis and abd cramping.  [name] was placed 04/30/11 by [facility].  Pt still complains of abd cramping, which was treated with hydrocodone and ultram.   Last seen at [facility] by OB/GYN 12/24/11, given Depo Lupron injection.  Ordering CT scan to r/o additional cause of abdominal pain (pt with known endometriosis and will not tolerate vaginal U/S, pain not improved s/p [name] or with Lupron injection-- only caused hot flashes and amenorrhea, no pain relief).  Also made referral to pain clinic for better pain control.  Do not think hysterectomy would help with pain, and concern for contamination of baclofen pump if did proceed with surgery.    8/5/13[name] OB/GYN: Pt was seen by   . [name]   . Dysarthria   . Dyspnea and respiratory abnormality 02/04/2011   Overview:  Overview:  Pt has history of recurrent PE and is on chronic coumadin.  Pt has been complaining of dyspnea that she describes as chest tightness.  She has been to the ER multiple times for this.  Usually she gets CTA and serial CE.  She has had at least 6 CTA in a period of since 2011.  She also has an O2 requirement. It is unclear why pt feels dyspneic, complains of  chest tightness, or has O2 requirement.  I thought that her complaint may be from deconditioning and referred her to PT.  Unfortunately Medicaid only pays for 4 PT visits.  Pt should be doing these exercises at home, but she has not.  I have referred her to [facility], and she has been seen by [name].  He did not understand the etiology of her dyspnea and O2 requirement.  He will continue to follow her.   Last Assessment & Plan:  Pt presents with concerns for chest tightness and dyspnea.  She was seen in ED yesterday and discharged home.  While there she had a CT chest 04/20/11 showed 1.  Multifocal degradation as detailed above.  N  . Endometriosis   . GERD (gastroesophageal reflux disease)   . Gram positive sepsis ([name]) 10/24/2014  . Gross hematuria 03/12/2013   Overview:  Overview:  status post replacement of suprapubic tube 03/04/2013  Last Assessment & Plan:  Recent urine culture collected in  ED on 5/4 did not result in significant growth. Moreover, patient has been asymptomatic with normal WBC, no fever and no true symptoms making symptomatic UTI less likely.  Will continue to monitor for any sign of infection.  Patient's xarelto had been held during her recent hospitalization due to hematuria. This has since been re-started. Monitor bleeding.    [name] Kitchen HCAP (healthcare-associated pneumonia) 10/25/2014  . HCAP (healthcare-associated pneumonia) 11/24/2017  . History of anticoagulant therapy 10/21/2012  . History of endometriosis 10/2012   OBGYN [facility]: Laparoscopic Endometrial Ablation, TVH,  . History of recurrent UTIs   . Hypertension   . HYPERTENSION, BENIGN 01/31/2010   Qualifier: Diagnosis of  By: [name], MD, [name] Incontinent of feces 03/11/2016  . Infantile cerebral palsy ([name]) 01/29/2007   Overview:  She has spastic CP.  She is mentally intact.  She is wheelchair bound.  She is wheelchair bound for ambulation      . Intracervical pessary 05/07/2011   Overview:  Overview:  Placed by [facility] 04/30/11 to treat DUB and Endometriosis.  She is seen at GYN clinic at [facility] but was considered a poor surgical candidate and referred her to [facility].  For DUB she had pelvic ultrasound that showed thin stripe and she had endometrial Bx by Dr Jennette Kettle in GYN clinic, which was negative.     . Macroscopic hematuria 03/10/2013   status post replacement of suprapubic tube 03/04/2013   . Migraine   . OCD (obsessive compulsive disorder)   . Parapneumonic effusion 10/25/2014  . Presence of intrathecal baclofen pump 03/07/2014   R LQ. Insertion T10.    [name] Kitchen Psychiatric illness 10/31/2014  . Pulmonary embolism ([name])    Lifetime Coumadin  . Pulmonary embolism ([name]) 2011, 01/2011   Will be on lifetime coumadin  . Recurrent pulmonary embolism ([name]) 05/03/2011   Pt has history of recurrent PE and is on chronic coumadin.  Pt has been complaining of dyspnea that she describes as chest tightness.  She has been to the ER multiple times for this.  Usually she gets CTA and serial CE.  She has had at least 6 CTA in a period of since 2011.  She also has an O2 requirement. It is unclear why pt feels dyspneic, complains of chest tightness, or has O2 requirement.  I thought that her complaint may be from deconditioning and referred her to PT.  Unfortunately Medicaid only pays for 4 PT visits.  Pt should be doing these exercises at home, but she has not.  I have referred her to [facility], and she has been seen by [name].  He did not understand the etiology of her dyspnea and O2 requirement.  He will continue to follow her.     . Seborrheic keratosis 01/18/2011  . Seizures ([name]) 02/12/2016  . Spasticity 01/05/2014  . Transient alteration of awareness, recurrent 09/08/2006   Recurrent episodes where Cammi  loses contact with her world and that have been studied thoroughly and appear nonepileptic in nature per Dr Terrace Arabia (neurology) The patient has had episodes starting in 2007 that initially began 1-4 times a day during which time  she would have periods of unawareness followed by confusion. This continuous video EEG monitoring performed from October 8-12, 2007. Had   . Urethra dilated and patulous 07/03/2015   Patulous, Dilated Urethra. 5mL balloon on a 22-Fr catheter hoping this would prevent the bladder spasm from pushing the balloon down the urethra (Dr Logan Bores, Urology  [facility], July 2016)    Hospitalizations: Yes.  , Head Injury: Yes.  , Nervous System Infections: No., Immunizations up to date: Yes.    Birth History Extremely premature infant  Behavior History Depression and anxiety  Surgical History Procedure Laterality Date  . ABDOMINAL HYSTERECTOMY    . APPENDECTOMY    . BACLOFEN PUMP REFILL     x 3 times  . CARPAL TUNNEL RELEASE  08/2008   Dr Teressa Senter  . CESAREAN SECTION     x 2  . CHOLECYSTECTOMY  10/14/2015   Procedure: LAPAROSCOPIC CHOLECYSTECTOMY;  Surgeon: Violeta Gelinas, MD;  Location: [facility] OR;  Service: General;;  . COLPOSCOPY  06/2000  . INTRAUTERINE DEVICE INSERTION  04/30/11   Inserted by [facility] for endometriosis  . LAPAROSCOPIC ASSISTED VAGINAL HYSTERECTOMY  10/27/2012  . MULTIPLE EXTRACTIONS WITH ALVEOLOPLASTY Bilateral 07/19/2013   Procedure: EXTRACTIONS #4, 1,61,09,60;  Surgeon: Francene Finders, DDS;  Location: [facility] OR;  Service: Oral Surgery;  Laterality: Bilateral;  . NEUROMA SURGERY Left    anterior and posterior intersosseous  . PAIN PUMP IMPLANTATION N/A 03/07/2014   Procedure: baclofen pump revision/replacement and Catheter connection replacement;  Surgeon: Cristi Loron, MD;  Location: [facility] OR[facility];  Service: Neurosurgery;  Laterality: N/A;  baclofen pump revision/replacement and Catheter connection replacement  . PROGRAMABLE BACLOFEN PUMP REVISION  03/17/14   Battery Replacement   . TUBAL LIGATION  2003  . URETHRA SURGERY    . WRIST SURGERY  06/2010   Dr Dierdre Searles, hand surgeon, [name]   Family History family history includes Alzheimer's disease in her paternal grandfather;  Asthma in her father; Breast cancer in her mother and paternal grandmother; Colon cancer in her maternal grandmother; Heart attack in her maternal grandfather. Family history is negative for migraines, seizures, intellectual disabilities, blindness, deafness, birth defects, chromosomal disorder, or autism.  Social History Social History   Socioeconomic History  . Marital status: Divorced    Spouse name: None  . Number of children: 2  . Years of education:  40  . Highest education level: [employer]  Social Needs  . Financial resource strain: None  . Food insecurity - worry: None  . Food insecurity - inability: None  . Transportation needs - medical: None  . Transportation needs - non-medical: None  Occupational History    Employer: UNEMPLOYED  Tobacco Use  . Smoking status: Never Smoker  . Smokeless tobacco: Never Used  Substance and Sexual Activity  . Alcohol use: Yes    Alcohol/week: 0.6 oz    Types: 1 Glasses of wine per week    Comment: Drinks alcohol once a month.  . Drug use: No  . 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     She enjoys shopping.    Lives in a nursing facility[facility] since 02/11/[facility]; she is trying to move into independent living with a CNA       Ailene Ards, who has schizophrenia, MR, is father of daughter.  Homero Fellers and pt are no longer together.  Homero Fellers does not see [name]. Daughter is 76 yo, Stage manager, lives with pt.    Needs assistance with ADL's and IADL's--has personal care services 20 hrs/wk; becoming more independent with ADLs       **Patient very concerned about her kids being taken from her.       **Case Manager: Jack Quarto [phone]   Allergies  Allergen Reactions  . Morphine Dermatitis and Other (See Comments)    Skin turned red   Physical Exam LMP 04/05/2011   Not examined today  Assessment 1.   Cerebral palsy, quadriplegic, G80.8. 2.  Athetoid cerebral palsy, G80.3  Discussion To his progress has been astonishing.  She is independently walking far enough to be able to get around inside safely.  She is lost a lot of weight which I think is made it easier for her to walk.  Her cervical surgery has markedly improved her head position and decrease pain.  She seemed much more outgoing and alert today than I seen her in a long time.  Plan I praised daily for her hard work in regaining the ability to ambulate after being wheelchair-bound for so long.  I expressed concern about her moving into independent living.  I certainly understand her want and need to do that, but I worry about her safety.   Medication List    Accurate as of 01/26/18 11:59 PM.      acetaminophen 500 MG tablet Commonly known as:  TYLENOL Take 500-650 mg by mouth See admin instructions. 500mg  three times daily and 650 three times daily as needed for pain (do not exceed 4000mg  in 24hrs)   amantadine 100 MG capsule Commonly known as:  SYMMETREL Take 100 mg by mouth daily.   baclofen 20 MG tablet Commonly known as:  LIORESAL Take 20 mg by mouth 4 (four) times daily. For bladder spasms   cephALEXin 500 MG capsule Commonly known as:  KEFLEX Take 1 capsule (500 mg total) by mouth 3 (three) times daily.   CERTA-VITE PO Take 1 tablet by mouth daily.   clonazePAM 0.5 MG tablet Commonly known as:  KLONOPIN Take 1.5 tablets (0.75 mg total) by mouth 3 (three) times daily.   fentaNYL 12 MCG/HR Commonly known as:  DURAGESIC - dosed mcg/hr Place 1 patch (12.5 mcg total) onto the skin every 3 (three) days.   ferrous sulfate 325 (65 FE) MG tablet Take 325 mg by mouth daily with breakfast.   FLUoxetine HCl 60 MG Tabs Take 60 mg by mouth daily.   lactose free nutrition Liqd Take 237 mLs by mouth 2 (two) times daily between meals.   lamoTRIgine 100 MG tablet Commonly known as:  LAMICTAL Take 100 mg by mouth at  bedtime.   lubiprostone 24 MCG capsule Commonly known as:  AMITIZA Take 1 capsule (24 mcg total) by mouth 2 (two) times daily with a meal.   Melatonin 5 MG Tabs Take 5 mg by mouth at bedtime.   OVER THE COUNTER MEDICATION Take 1 Package by mouth 3 (three) times daily. Magic cup   oxybutynin 5 MG tablet Commonly known as:  DITROPAN Take 5 mg by mouth 4 (four) times daily.   OxyCODONE HCl (Abuse Deter) 5 MG Taba Commonly known as:  OXAYDO Take 5 mg by mouth every 4 (four) hours as needed (Pain).   polyethylene glycol powder powder Commonly known as:  GLYCOLAX/MIRALAX Take 1  1/2 dose  daily in 12 ounces of fluid every day.   QUEtiapine 100 MG tablet Commonly known as:  SEROQUEL Take 1 tablet (100 mg total) by mouth at bedtime.   tiZANidine 4 MG capsule Commonly known as:  ZANAFLEX Take 4 mg by mouth 2 (two) times daily.   warfarin 6 MG tablet Commonly known as:  COUMADIN Take as directed by the anticoagulation clinic. If you are unsure how to take this medication, talk  to your nurse or doctor. Original instructions:  Take 6 mg by mouth daily.    The medication list was reviewed and reconciled. All changes or newly prescribed medications were explained.  A complete medication list was provided to the patient/caregiver.  Deetta PerlaWilliam H Hickling MD

## 2018-01-28 ENCOUNTER — Telehealth: Payer: Self-pay | Admitting: Emergency Medicine

## 2018-01-28 NOTE — Progress Notes (Signed)
ED Antimicrobial Stewardship Positive Culture Follow Up   [name] SlickerJudith Campus is an 42 y.o. female who [facility] with a chief complaint of  Chief Complaint  Patient presents with  . Suprabupic Catheter Problem    Recent Results (from the past 720 hour(s))  Urine culture     Status: Abnormal   Collection Time: 01/25/18 12:53 AM  Result Value Ref Range Status   Specimen Description   Final    URINE, RANDOM Performed at [facility], [address] [place], [place] [address]    Special Requests   Final    NONE Performed at [facility], [address] [place], [place] [address]    Culture (A)  Final    >=100,000 COLONIES/mL ESCHERICHIA COLI Confirmed Extended Spectrum Beta-Lactamase Producer (ESBL).  In bloodstream infections from ESBL organisms, carbapenems are preferred over piperacillin/tazobactam. They are shown to have a lower risk of mortality. Performed at [facility], [address] [place], [place] [address]    Report Status 01/27/2018 FINAL  Final   Organism ID, Bacteria ESCHERICHIA COLI (A)  Final      Susceptibility   Escherichia coli - MIC*    AMPICILLIN >=32 RESISTANT Resistant     CEFAZOLIN >=64 RESISTANT Resistant     CEFTRIAXONE >=64 RESISTANT Resistant     CIPROFLOXACIN >=4 RESISTANT Resistant     GENTAMICIN <=1 SENSITIVE Sensitive     IMIPENEM <=0.25 SENSITIVE Sensitive     NITROFURANTOIN 128 RESISTANT Resistant     TRIMETH/SULFA >=320 RESISTANT Resistant     AMPICILLIN/SULBACTAM >=32 RESISTANT Resistant     PIP/TAZO <=4 SENSITIVE Sensitive     Extended ESBL POSITIVE Resistant     * >=100,000 COLONIES/mL ESCHERICHIA COLI    Needs additional follow-up: Call for symptom check and d/c Keflex (not covering) - If still symptomatic - Start Fosfomycin 3g po q48h x 2 doses - If improving/resolving - no additional treatment warranted  ED Provider: Frederik PearMia McDonald, PA-C   Rolley SimsMartin, [name]  [name] 01/28/2018, 8:53 AM Infectious Diseases Pharmacist Phone# [phone]

## 2018-01-28 NOTE — Telephone Encounter (Signed)
Faxed culture report and [facility] rx [facility] @ [phone] attn Alcario DroughtErica

## 2018-01-28 NOTE — Telephone Encounter (Signed)
Post ED Visit - Positive Culture Follow-up: Successful Patient Follow-Up  Culture assessed and recommendations reviewed by: []  Enzo BiNathan Batchelder, Pharm.D. []  Celedonio MiyamotoJeremy Frens, Pharm.D., BCPS AQ-ID []  Garvin FilaMike Maccia, Pharm.D., BCPS [x]  Georgina PillionElizabeth [name], Pharm.D., BCPS []  Naples ParkMinh Pham, [address]Ph[address].D., BCPS, AAHIVP []  Estella HuskMichelle Turner, Pharm.D., BCPS, AAHIVP []  Lysle Pearlachel Rumbarger, PharmD, BCPS []  Casilda Carlsaylor Stone, PharmD, BCPS []  Pollyann SamplesAndy Johnston, PharmD, BCPS  Positive urine culture  []  Patient discharged without antimicrobial prescription and treatment is now indicated [x]  Organism is resistant to prescribed ED discharge antimicrobial []  Patient with positive blood cultures  Changes discussed with ED provider: Frederik PearMia McDonald PA New antibiotic prescription d/c keflex, start Fosfomycin 3grams po q 48 hours x 3 doses  Awaiting fax number @ [facility] @ [phone]   Berle MullMiller, Doloris Servantes 01/28/2018, 2:09 PM

## 2018-02-03 ENCOUNTER — Ambulatory Visit: Payer: Medicare Other | Admitting: Physical Medicine & Rehabilitation

## 2018-02-12 DIAGNOSIS — Z4789 Encounter for other orthopedic aftercare: Secondary | ICD-10-CM | POA: Diagnosis not present

## 2018-02-12 DIAGNOSIS — M4142 Neuromuscular scoliosis, cervical region: Secondary | ICD-10-CM | POA: Diagnosis not present

## 2018-02-12 DIAGNOSIS — Z981 Arthrodesis status: Secondary | ICD-10-CM | POA: Diagnosis not present

## 2018-02-12 DIAGNOSIS — R0989 Other specified symptoms and signs involving the circulatory and respiratory systems: Secondary | ICD-10-CM | POA: Diagnosis not present

## 2018-02-12 DIAGNOSIS — Z885 Allergy status to narcotic agent status: Secondary | ICD-10-CM | POA: Diagnosis not present

## 2018-02-13 DIAGNOSIS — Z981 Arthrodesis status: Secondary | ICD-10-CM | POA: Diagnosis not present

## 2018-02-13 DIAGNOSIS — M4142 Neuromuscular scoliosis, cervical region: Secondary | ICD-10-CM | POA: Diagnosis not present

## 2018-02-13 DIAGNOSIS — M4312 Spondylolisthesis, cervical region: Secondary | ICD-10-CM | POA: Diagnosis not present

## 2018-02-13 DIAGNOSIS — Z9889 Other specified postprocedural states: Secondary | ICD-10-CM | POA: Diagnosis not present

## 2018-02-16 ENCOUNTER — Encounter: Payer: Medicare Other | Attending: Physical Medicine & Rehabilitation

## 2018-02-16 ENCOUNTER — Ambulatory Visit: Payer: Medicare Other | Admitting: Physical Medicine & Rehabilitation

## 2018-02-16 DIAGNOSIS — R252 Cramp and spasm: Secondary | ICD-10-CM | POA: Insufficient documentation

## 2018-02-23 DIAGNOSIS — N3941 Urge incontinence: Secondary | ICD-10-CM | POA: Diagnosis not present

## 2018-02-23 DIAGNOSIS — N319 Neuromuscular dysfunction of bladder, unspecified: Secondary | ICD-10-CM | POA: Diagnosis not present

## 2018-02-23 DIAGNOSIS — L928 Other granulomatous disorders of the skin and subcutaneous tissue: Secondary | ICD-10-CM | POA: Diagnosis not present

## 2018-02-23 DIAGNOSIS — N3281 Overactive bladder: Secondary | ICD-10-CM | POA: Diagnosis not present

## 2018-02-23 DIAGNOSIS — Z86711 Personal history of pulmonary embolism: Secondary | ICD-10-CM | POA: Diagnosis not present

## 2018-02-23 DIAGNOSIS — Z9889 Other specified postprocedural states: Secondary | ICD-10-CM | POA: Diagnosis not present

## 2018-02-24 DIAGNOSIS — M419 Scoliosis, unspecified: Secondary | ICD-10-CM | POA: Diagnosis not present

## 2018-02-24 DIAGNOSIS — Z86711 Personal history of pulmonary embolism: Secondary | ICD-10-CM | POA: Diagnosis not present

## 2018-02-24 DIAGNOSIS — N3941 Urge incontinence: Secondary | ICD-10-CM | POA: Diagnosis not present

## 2018-02-24 DIAGNOSIS — L928 Other granulomatous disorders of the skin and subcutaneous tissue: Secondary | ICD-10-CM | POA: Diagnosis not present

## 2018-02-24 DIAGNOSIS — N319 Neuromuscular dysfunction of bladder, unspecified: Secondary | ICD-10-CM | POA: Diagnosis not present

## 2018-02-24 DIAGNOSIS — M6281 Muscle weakness (generalized): Secondary | ICD-10-CM | POA: Diagnosis not present

## 2018-02-24 DIAGNOSIS — M4322 Fusion of spine, cervical region: Secondary | ICD-10-CM | POA: Diagnosis not present

## 2018-02-24 DIAGNOSIS — Z9889 Other specified postprocedural states: Secondary | ICD-10-CM | POA: Diagnosis not present

## 2018-02-24 DIAGNOSIS — N3281 Overactive bladder: Secondary | ICD-10-CM | POA: Diagnosis not present

## 2018-02-27 DIAGNOSIS — M4180 Other forms of scoliosis, site unspecified: Secondary | ICD-10-CM | POA: Diagnosis not present

## 2018-02-27 DIAGNOSIS — R51 Headache: Secondary | ICD-10-CM | POA: Diagnosis not present

## 2018-03-02 DIAGNOSIS — F4322 Adjustment disorder with anxiety: Secondary | ICD-10-CM | POA: Diagnosis not present

## 2018-03-03 ENCOUNTER — Ambulatory Visit: Payer: Medicare Other | Admitting: Physical Medicine & Rehabilitation

## 2018-03-05 ENCOUNTER — Ambulatory Visit: Payer: Medicare Other | Admitting: Physical Medicine & Rehabilitation

## 2018-03-11 DIAGNOSIS — F331 Major depressive disorder, recurrent, moderate: Secondary | ICD-10-CM | POA: Diagnosis not present

## 2018-03-12 DIAGNOSIS — G800 Spastic quadriplegic cerebral palsy: Secondary | ICD-10-CM | POA: Diagnosis not present

## 2018-03-12 DIAGNOSIS — T83038A Leakage of other indwelling urethral catheter, initial encounter: Secondary | ICD-10-CM | POA: Diagnosis not present

## 2018-03-12 DIAGNOSIS — N319 Neuromuscular dysfunction of bladder, unspecified: Secondary | ICD-10-CM | POA: Diagnosis not present

## 2018-03-12 DIAGNOSIS — N3941 Urge incontinence: Secondary | ICD-10-CM | POA: Diagnosis not present

## 2018-03-12 DIAGNOSIS — Z466 Encounter for fitting and adjustment of urinary device: Secondary | ICD-10-CM | POA: Diagnosis not present

## 2018-03-16 DIAGNOSIS — L603 Nail dystrophy: Secondary | ICD-10-CM | POA: Diagnosis not present

## 2018-03-16 DIAGNOSIS — G8220 Paraplegia, unspecified: Secondary | ICD-10-CM | POA: Diagnosis not present

## 2018-03-16 DIAGNOSIS — G809 Cerebral palsy, unspecified: Secondary | ICD-10-CM | POA: Diagnosis not present

## 2018-03-16 DIAGNOSIS — M4180 Other forms of scoliosis, site unspecified: Secondary | ICD-10-CM | POA: Diagnosis not present

## 2018-03-16 DIAGNOSIS — I739 Peripheral vascular disease, unspecified: Secondary | ICD-10-CM | POA: Diagnosis not present

## 2018-03-16 DIAGNOSIS — R51 Headache: Secondary | ICD-10-CM | POA: Diagnosis not present

## 2018-03-17 DIAGNOSIS — H53143 Visual discomfort, bilateral: Secondary | ICD-10-CM | POA: Diagnosis not present

## 2018-03-17 DIAGNOSIS — G809 Cerebral palsy, unspecified: Secondary | ICD-10-CM | POA: Diagnosis not present

## 2018-03-17 DIAGNOSIS — H539 Unspecified visual disturbance: Secondary | ICD-10-CM | POA: Diagnosis not present

## 2018-03-17 DIAGNOSIS — H04123 Dry eye syndrome of bilateral lacrimal glands: Secondary | ICD-10-CM | POA: Diagnosis not present

## 2018-03-19 ENCOUNTER — Ambulatory Visit (HOSPITAL_BASED_OUTPATIENT_CLINIC_OR_DEPARTMENT_OTHER): Payer: Medicare Other | Admitting: Physical Medicine & Rehabilitation

## 2018-03-19 ENCOUNTER — Encounter: Payer: Medicare Other | Attending: Physical Medicine & Rehabilitation

## 2018-03-19 ENCOUNTER — Encounter: Payer: Self-pay | Admitting: Physical Medicine & Rehabilitation

## 2018-03-19 VITALS — BP 108/72 | HR 81 | Resp 14

## 2018-03-19 DIAGNOSIS — R252 Cramp and spasm: Secondary | ICD-10-CM | POA: Insufficient documentation

## 2018-03-19 DIAGNOSIS — G800 Spastic quadriplegic cerebral palsy: Secondary | ICD-10-CM | POA: Diagnosis not present

## 2018-03-19 NOTE — Progress Notes (Signed)
Dysport Injection for spasticity using needle EMG guidance  Dilution: 200 Units/ml Indication: Severe spasticity which interferes with ADL,mobility and/or  hygiene and is unresponsive to medication management and other conservative care Informed consent was obtained after describing risks and benefits of the procedure with the patient. This includes bleeding, bruising, infection, excessive weakness, or medication side effects. A REMS form is on file and signed. Needle:  needle electrode Number of units per muscle Biceps200 FCR200 FCU200 FDP100 FDS 100 Pronator teres 100 Pronator quad 100 All injections were done after obtaining appropriate EMG activity and after negative drawback for blood. The patient tolerated the procedure well. Post procedure instructions were given. A followup appointment was made.  

## 2018-03-19 NOTE — Patient Instructions (Signed)

## 2018-03-24 ENCOUNTER — Emergency Department (HOSPITAL_COMMUNITY)
Admission: EM | Admit: 2018-03-24 | Discharge: 2018-03-24 | Disposition: A | Discharge status: Home / Self Care | Payer: Medicare Other | Attending: Emergency Medicine | Admitting: Emergency Medicine

## 2018-03-24 ENCOUNTER — Encounter (HOSPITAL_COMMUNITY): Payer: Self-pay | Admitting: Emergency Medicine

## 2018-03-24 DIAGNOSIS — Z7901 Long term (current) use of anticoagulants: Secondary | ICD-10-CM | POA: Diagnosis not present

## 2018-03-24 DIAGNOSIS — I10 Essential (primary) hypertension: Secondary | ICD-10-CM | POA: Diagnosis not present

## 2018-03-24 DIAGNOSIS — Y658 Other specified misadventures during surgical and medical care: Secondary | ICD-10-CM | POA: Insufficient documentation

## 2018-03-24 DIAGNOSIS — N390 Urinary tract infection, site not specified: Secondary | ICD-10-CM | POA: Diagnosis not present

## 2018-03-24 DIAGNOSIS — Z79899 Other long term (current) drug therapy: Secondary | ICD-10-CM | POA: Insufficient documentation

## 2018-03-24 DIAGNOSIS — G809 Cerebral palsy, unspecified: Secondary | ICD-10-CM | POA: Diagnosis not present

## 2018-03-24 DIAGNOSIS — T83090A Other mechanical complication of cystostomy catheter, initial encounter: Secondary | ICD-10-CM | POA: Insufficient documentation

## 2018-03-24 DIAGNOSIS — T83198A Other mechanical complication of other urinary devices and implants, initial encounter: Secondary | ICD-10-CM | POA: Diagnosis not present

## 2018-03-24 DIAGNOSIS — R1084 Generalized abdominal pain: Secondary | ICD-10-CM | POA: Diagnosis not present

## 2018-03-24 LAB — CBC
HCT: 40.1 % (ref 36.0–46.0)
Hemoglobin: 13.1 g/dL (ref 12.0–15.0)
MCH: 27.5 pg (ref 26.0–34.0)
MCHC: 32.7 g/dL (ref 30.0–36.0)
MCV: 84.2 fL (ref 78.0–100.0)
Platelets: 280 10*3/uL (ref 150–400)
RBC: 4.76 MIL/uL (ref 3.87–5.11)
RDW: 15 % (ref 11.5–15.5)
WBC: 8.5 10*3/uL (ref 4.0–10.5)

## 2018-03-24 LAB — COMPREHENSIVE METABOLIC PANEL
ALBUMIN: 3.9 g/dL (ref 3.5–5.0)
ALK PHOS: 109 U/L (ref 38–126)
ALT: 87 U/L — AB (ref 14–54)
AST: 203 U/L — AB (ref 15–41)
Anion gap: 11 (ref 5–15)
BILIRUBIN TOTAL: 1.3 mg/dL — AB (ref 0.3–1.2)
BUN: 11 mg/dL (ref 6–20)
CALCIUM: 9.4 mg/dL (ref 8.9–10.3)
CO2: 25 mmol/L (ref 22–32)
CREATININE: 0.55 mg/dL (ref 0.44–1.00)
Chloride: 101 mmol/L (ref 101–111)
GFR calc Af Amer: >60 mL/min (ref >60)
GFR calc non Af Amer: >60 mL/min (ref >60)
GLUCOSE: 88 mg/dL (ref 65–99)
Potassium: 4.4 mmol/L (ref 3.5–5.1)
Sodium: 137 mmol/L (ref 135–145)
TOTAL PROTEIN: 7.2 g/dL (ref 6.5–8.1)

## 2018-03-24 LAB — URINALYSIS, MICROSCOPIC (REFLEX)
Squamous Epithelial / LPF: NONE SEEN (ref 0–5)
WBC, UA: >50 WBC/hpf (ref 0–5)

## 2018-03-24 LAB — URINALYSIS, ROUTINE W REFLEX MICROSCOPIC
BILIRUBIN URINE: NEGATIVE
Glucose, UA: NEGATIVE mg/dL
KETONES UR: NEGATIVE mg/dL
NITRITE: POSITIVE — AB
PH: 8 (ref 5.0–8.0)
Protein, ur: 30 mg/dL — AB
Specific Gravity, Urine: 1.01 (ref 1.005–1.030)

## 2018-03-24 LAB — LIPASE, BLOOD: Lipase: 22 U/L (ref 11–51)

## 2018-03-24 LAB — I-STAT BETA HCG BLOOD, ED (MC, WL, AP ONLY)

## 2018-03-24 LAB — I-STAT CG4 LACTIC ACID, ED: Lactic Acid, Venous: 1.32 mmol/L (ref 0.5–1.9)

## 2018-03-24 MED ORDER — SODIUM CHLORIDE 0.9 % IV SOLN
1.0000 g | Freq: Once | INTRAVENOUS | Status: AC
  Administered 2018-03-24: 1 g via INTRAVENOUS
  Filled 2018-03-24: qty 10

## 2018-03-24 MED ORDER — CEPHALEXIN 500 MG PO CAPS: 500.0000 mg | ORAL_CAPSULE | Freq: Four times a day (QID) | ORAL | 0 refills | Status: DC

## 2018-03-24 MED ORDER — FENTANYL CITRATE (PF) 100 MCG/2ML IJ SOLN
50.0000 ug | Freq: Once | INTRAMUSCULAR | Status: AC
  Administered 2018-03-24: 50 ug via INTRAVENOUS
  Filled 2018-03-24: qty 2

## 2018-03-24 NOTE — ED Provider Notes (Signed)
[facility] EMERGENCY DEPARTMENT Provider Note   CSN: [id number] Arrival date & time: 03/24/18  1191     History   Chief Complaint Chief Complaint  Patient presents with  . Abdominal Pain    HPI [name] [name] is a 42 y.o. female.  HPI  Patient with history of cerebral palsy with indwelling suprapubic catheter presents with complaint of suprapubic pain and decreased output from the catheter.  She has been seen in the ED numerous times for similar presentations.  She states the facility where she lives is supposed to flush the catheter 3 times daily.  She states that since it was replaced 2 months ago the facility has flushed it 2 times only in the past 2 months.  She has no fever or chills.  No vomiting.  She was treated 2 months ago for a UTI with E. coli positive urine culture which was resistant to multiple oral medications.  She states the suprapubic pain is constant and sharp.  There are no other associated systemic symptoms, there are no other alleviating or modifying factors.   Past Medical History:  Diagnosis Date  . Abdominal pain 01/02/2012   Overview:  Overview:  Per Previous pcp note- Dr. Ta--Pt has history of endometriosis and had DUB in 2011.  She She had u/s that showed normal endometrial stripe.  She had endometrial bx that was wnl.  She has a lot of abd cramping every month.  This cramping was sometimes not relieved with hydrocodone.  She was referred to [facility] for IUD placement to help endometriosis and abd cramping.  [name]  . Abdominal pain 01/02/2012   Overview:  Overview:  Per Previous pcp note- Dr. Ta--Pt has history of endometriosis and had DUB in 2011.  She She had u/s that showed normal endometrial stripe.  She had endometrial bx that was wnl.  She has a lot of abd cramping every month.  This cramping was sometimes not relieved with hydrocodone.  She was referred to [facility] for IUD placement to help endometriosis and abd cramping.  [name]  was placed 04/30/11 by [facility].  Pt still complains of abd cramping, which I am treating with hydrocodone and ultram. Pt reports some initial relief of pain from IUD, but states it has now returned back to similar to previous cramping. Gave patient a shot of Toradol 60 mg IM today-since positive exacerbation of abdominal cramping during past 2 weeks. Physical exam reassuring. Patient may need to return to [facility] for further workup since she is plugged in with the GYN providers there for further treatment options regarding endometriosis and abdominal cramping. The dysfunctional uterine bleeding now well contr  . Acute cystitis with hematuria   . Acute cystitis without hematuria   . Acute respiratory failure with hypoxia ([name])   . Anemia   . Cellulitis 12/22/2014  . Cerebral palsy ([name])    Spastic Cerebral palsy, mentally intact  . Contracture, joint, multiple sites    Electric wheelchair, uses left hand to operate chair.   . Depression   . Disease of female genital organs 10/24/2010   Overview:  Overview:  Pt has history of endometriosis and had DUB in 2011.  She had u/s that showed normal endometrial stripe.  She had endometrial bx that was wnl.  She has a lot of abd cramping every month.  This cramping was sometimes not relieved with hydrocodone.  She was referred to [facility] for IUD placement to help endometriosis and abd cramping.  [name] was placed 04/30/11 by [facility].  Pt still complains of abd cramping, which was treated with hydrocodone and ultram.   Last seen at [facility] by OB/GYN 12/24/11, given Depo Lupron injection.  Ordering CT scan to r/o additional cause of abdominal pain (pt with known endometriosis and will not tolerate vaginal U/S, pain not improved s/p [name] or with Lupron injection-- only caused hot flashes and amenorrhea, no pain relief).  Also made referral to pain clinic for better pain control.  Do not think hysterectomy would help with pain, and concern for contamination of  baclofen pump if did proceed with surgery.    8/5/13[name]: Pt was seen by   . [name]   . Dysarthria   . Dyspnea and respiratory abnormality 02/04/2011   Overview:  Overview:  Pt has history of recurrent PE and is on chronic coumadin.  Pt has been complaining of dyspnea that she describes as chest tightness.  She has been to the ER multiple times for this.  Usually she gets CTA and serial CE.  She has had at least 6 CTA in a period of since 2011.  She also has an O2 requirement. It is unclear why pt feels dyspneic, complains of chest tightness, or has O2 requirement.  I thought that her complaint may be from deconditioning and referred her to PT.  Unfortunately Medicaid only pays for 4 PT visits.  Pt should be doing these exercises at home, but she has not.  I have referred her to [facility], and she has been seen by [name].  He did not understand the etiology of her dyspnea and O2 requirement.  He will continue to follow her.   Last Assessment & Plan:  Pt presents with concerns for chest tightness and dyspnea.  She was seen in ED yesterday and discharged home.  While there she had a CT chest 04/20/11 showed 1.  Multifocal degradation as detailed above.  N  . Endometriosis   . GERD (gastroesophageal reflux disease)   . Gram positive sepsis ([name]) 10/24/2014  . Gross hematuria 03/12/2013   Overview:  Overview:  status post replacement of suprapubic tube 03/04/2013  Last Assessment & Plan:  Recent urine culture collected in ED on 5/4 did not result in significant growth. Moreover, patient has been asymptomatic with normal WBC, no fever and no true symptoms making symptomatic UTI less likely.  Will continue to monitor for any sign of infection.  Patient's xarelto had been held during her recent hospitalization due to hematuria. This has since been re-started. Monitor bleeding.    [name] Kitchen HCAP (healthcare-associated pneumonia) 10/25/2014  . HCAP (healthcare-associated pneumonia)  11/24/2017  . History of anticoagulant therapy 10/21/2012  . History of endometriosis 10/2012   OBGYN [facility]: Laparoscopic Endometrial Ablation, TVH,  . History of recurrent UTIs   . Hypertension   . HYPERTENSION, BENIGN 01/31/2010   Qualifier: Diagnosis of  By: [name], MD, [name] Incontinent of feces 03/11/2016  . Infantile cerebral palsy ([name]) 01/29/2007   Overview:  She has spastic CP.  She is mentally intact.  She is wheelchair bound.  She is wheelchair bound for ambulation      . Intracervical pessary 05/07/2011   Overview:  Overview:  Placed by [facility] GYN 04/30/11 to treat DUB and Endometriosis.  She is seen at GYN clinic at [facility] but was considered a poor surgical candidate and referred her to [facility].  For DUB she had pelvic ultrasound that  showed thin stripe and she had endometrial Bx by Dr Jennette Kettle in GYN clinic, which was negative.     . Macroscopic hematuria 03/10/2013   status post replacement of suprapubic tube 03/04/2013   . Migraine   . OCD (obsessive compulsive disorder)   . Parapneumonic effusion 10/25/2014  . Presence of intrathecal baclofen pump 03/07/2014   R LQ. Insertion T10.    [name] Kitchen Psychiatric illness 10/31/2014  . Pulmonary embolism ([name])    Lifetime Coumadin  . Pulmonary embolism ([name]) 2011, 01/2011   Will be on lifetime coumadin  . Recurrent pulmonary embolism ([name]) 05/03/2011   Pt has history of recurrent PE and is on chronic coumadin.  Pt has been complaining of dyspnea that she describes as chest tightness.  She has been to the ER multiple times for this.  Usually she gets CTA and serial CE.  She has had at least 6 CTA in a period of since 2011.  She also has an O2 requirement. It is unclear why pt feels dyspneic, complains of chest tightness, or has O2 requirement.  I thought that her complaint may be from deconditioning and referred her to PT.  Unfortunately Medicaid only pays for 4 PT visits.  Pt should be doing these exercises at home, but she has not.  I have  referred her to [facility], and she has been seen by [name].  He did not understand the etiology of her dyspnea and O2 requirement.  He will continue to follow her.     . Seborrheic keratosis 01/18/2011  . Seizures ([name]) 02/12/2016  . Spasticity 01/05/2014  . Transient alteration of awareness, recurrent 09/08/2006   Recurrent episodes where [place]  loses contact with her world and that have been studied thoroughly and appear nonepileptic in nature per Dr Terrace Arabia (neurology) The patient has had episodes starting in 2007 that initially began 1-4 times a day during which time she would have periods of unawareness followed by confusion. This continuous video EEG monitoring performed from October 8-12, 2007. Had   . Urethra dilated and patulous 07/03/2015   Patulous, Dilated Urethra. 5mL balloon on a 22-Fr catheter hoping this would prevent the bladder spasm from pushing the balloon down the urethra (Dr Logan Bores, Urology [facility], July 2016)     Patient Active Problem List   Diagnosis Date Noted  . Incontinent of feces 03/11/2016  . Chronic anticoagulation 03/11/2016  . Involuntary movements 02/22/2016  . Seizures ([name]) 02/12/2016  . Dyspnea 02/12/2016  . Cerebral palsy, quadriplegic ([name]) 02/06/2016  . Swelling of extremity 01/13/2016  . Pressure ulcer 10/09/2015  . Status post insertion of inferior vena caval filter 09/28/2015  . Absence of bladder continence 08/25/2015  . Person living in residential institution 06/14/2015  . Pulmonary hypertension ([name])   . Anemia of chronic disease   . Spasticity 03/15/2015  . Constipation, chronic 10/10/2014  . Athetoid cerebral palsy ([name]) 04/13/2014  . Dystonia 04/12/2014  . UTI (lower urinary tract infection) 03/16/2014  . Presence of intrathecal baclofen pump 03/07/2014  . Dental caries 07/18/2013  . Multiple thyroid nodules 05/20/2013  . Osteoarthrosis 04/07/2013  . [name]   . Dysarthria   . GERD (gastroesophageal reflux disease)    . Neurogenic bladder 02/05/2013  . Anxiety state 10/21/2012  . Chronic pain syndrome 06/17/2012  . Recurrent pulmonary embolism ([name]) 05/03/2011  . Pulmonary embolism with infarction ([name]) 05/03/2011  . Chronic suprapubic catheter ([name]) 03/15/2011  . Disease of female genital organs 10/24/2010  . HYPERTENSION,  BENIGN 01/31/2010  . CP (cerebral palsy), spastic ([name]) 12/27/2008  . Contracted, joint, multiple sites 12/27/2008  . Major depressive disorder, recurrent episode ([name]) 01/29/2007  . Obsessive Compulsive Disorder 01/29/2007  . Transient alteration of awareness, recurrent 09/08/2006    Past Surgical History:  Procedure Laterality Date  . ABDOMINAL HYSTERECTOMY    . APPENDECTOMY    . BACLOFEN PUMP REFILL     x 3 times  . CARPAL TUNNEL RELEASE  08/2008   Dr Teressa Senter  . CESAREAN SECTION     x 2  . CHOLECYSTECTOMY  10/14/2015   Procedure: LAPAROSCOPIC CHOLECYSTECTOMY;  Surgeon: Violeta Gelinas, MD;  Location: [facility] OR;  Service: General;;  . COLPOSCOPY  06/2000  . INTRAUTERINE DEVICE INSERTION  04/30/11   Inserted by [facility] GYN for endometriosis  . LAPAROSCOPIC ASSISTED VAGINAL HYSTERECTOMY  10/27/2012  . MULTIPLE EXTRACTIONS WITH ALVEOLOPLASTY Bilateral 07/19/2013   Procedure: EXTRACTIONS #4, 1,61,09,60;  Surgeon: Francene Finders, DDS;  Location: [facility] OR;  Service: Oral Surgery;  Laterality: Bilateral;  . NEUROMA SURGERY Left    anterior and posterior intersosseous  . PAIN PUMP IMPLANTATION N/A 03/07/2014   Procedure: baclofen pump revision/replacement and Catheter connection replacement;  Surgeon: Cristi Loron, MD;  Location: [facility] ORS;  Service: Neurosurgery;  Laterality: N/A;  baclofen pump revision/replacement and Catheter connection replacement  . PROGRAMABLE BACLOFEN PUMP REVISION  03/17/14   Battery Replacement   . TUBAL LIGATION  2003  . URETHRA SURGERY    . WRIST SURGERY  06/2010   Dr Dierdre Searles, hand surgeon, [name]     OB History   None      Home  Medications    Prior to Admission medications   Medication Sig Start Date End Date Taking? Authorizing Provider  acetaminophen (TYLENOL) 500 MG tablet Take 500-650 mg by mouth See admin instructions. 500mg  three times daily and 650 three times daily as needed for pain (do not exceed 4000mg  in 24hrs)    [provider]  amantadine (SYMMETREL) 100 MG capsule Take 100 mg by mouth daily.     [provider]  baclofen (LIORESAL) 20 MG tablet Take 20 mg by mouth 4 (four) times daily. For bladder spasms    [provider]  cephALEXin (KEFLEX) 500 MG capsule Take 1 capsule (500 mg total) by mouth 4 (four) times daily. 03/24/18   Oona Trammel, Latanya Maudlin, MD  clonazePAM (KLONOPIN) 0.5 MG tablet Take 1.5 tablets (0.75 mg total) by mouth 3 (three) times daily. 11/27/17   Narda Bonds, MD  fentaNYL (DURAGESIC - DOSED MCG/HR) 12 MCG/HR Place 1 patch (12.5 mcg total) onto the skin every 3 (three) days. 11/27/17   Narda Bonds, MD  ferrous sulfate 325 (65 FE) MG tablet Take 325 mg by mouth daily with breakfast.    [provider]  FLUoxetine HCl 60 MG TABS Take 60 mg by mouth daily.     [provider]  lactose free nutrition (BOOST) LIQD Take 237 mLs by mouth 2 (two) times daily between meals.    [provider]  lamoTRIgine (LAMICTAL) 100 MG tablet Take 100 mg by mouth at bedtime.    [provider]  lubiprostone (AMITIZA) 24 MCG capsule Take 1 capsule (24 mcg total) by mouth 2 (two) times daily with a meal. 01/01/17   Esterwood, [name] S, PA-C  Melatonin 5 MG TABS Take 5 mg by mouth at bedtime.     [provider]  Multiple Vitamins-Minerals (CERTA-VITE PO) Take 1  tablet by mouth daily.    [provider]  OVER THE COUNTER MEDICATION Take 1 Package by mouth 3 (three) times daily. Magic cup    [provider]  oxybutynin (DITROPAN) 5 MG tablet Take 5 mg by mouth 4 (four) times daily.  02/20/16   [provider]  OxyCODONE  HCl, Abuse Deter, (OXAYDO) 5 MG TABA Take 5 mg by mouth every 4 (four) hours as needed (Pain).    [provider]  polyethylene glycol powder (GLYCOLAX/MIRALAX) powder Take 1  1/2 dose  daily in 12 ounces of fluid every day. Patient taking differently: Take 17 g by mouth daily.  01/01/17   Esterwood, [name] S, PA-C  QUEtiapine (SEROQUEL) 100 MG tablet Take 1 tablet (100 mg total) by mouth at bedtime. 06/21/15   Tyrone Nine, MD  tiZANidine (ZANAFLEX) 4 MG capsule Take 4 mg by mouth 2 (two) times daily.     [provider]  warfarin (COUMADIN) 6 MG tablet Take 6 mg by mouth daily.    [provider]    Family History Family History  Problem Relation Age of Onset  . Asthma Father   . Colon cancer Maternal Grandmother        Died in her 67's  . Breast cancer Paternal Grandmother        Died in her 61's  . Heart attack Maternal Grandfather        Died in his 42's  . Alzheimer's disease Paternal Grandfather        Died in his 61's  . Breast cancer Mother   . Stomach cancer Neg Hx     Social History Social History   Tobacco Use  . Smoking status: Never Smoker  . Smokeless tobacco: Never Used  Substance Use Topics  . Alcohol use: Yes    Alcohol/week: 0.6 oz    Types: 1 Glasses of wine per week    Comment: Drinks alcohol once a month.  . Drug use: No     Allergies   Morphine   Review of Systems Review of Systems  ROS reviewed and all otherwise negative except for mentioned in HPI   Physical Exam Updated Vital Signs BP 108/76   Pulse 81   Temp 98 F (36.7 C) (Oral)   Resp 15   Ht 4\' 10"  (1.473 m)   Wt 48.1 kg (106 lb)   LMP 04/05/2011   SpO2 91%   BMI 22.15 kg/m  Vitals reviewed Physical Exam  Physical Examination: General appearance - alert, chronically ill appearing, and in no distress Mental status - alert, oriented to person, place, and time Eyes - no conjunctival injection, no scleral icterus Mouth - mucous membranes moist, pharynx  normal without lesions Neck - supple, no significant adenopathy Chest - clear to auscultation, no wheezes, rales or rhonchi, symmetric air entry Heart - normal rate, regular rhythm, normal S1, S2, no murmurs, rubs, clicks or gallops Abdomen - soft, ttp in suprapubic area, suprapubic catheter in place- no erythema around site, , nondistended, no masses or organomegaly Neurological - alert, oriented x 3, increased tone in extremities x4, dysphona Extremities - peripheral pulses normal, no pedal edema, no clubbing or cyanosis Skin - normal coloration and turgor, no rashes   ED Treatments / Results  Labs (all labs ordered are listed, but only abnormal results are displayed) Labs Reviewed  COMPREHENSIVE METABOLIC PANEL - Abnormal; Notable for the following components:      Result Value   AST 203 (*)  ALT 87 (*)    Total Bilirubin 1.3 (*)    All other components within normal limits  URINALYSIS, ROUTINE W REFLEX MICROSCOPIC - Abnormal; Notable for the following components:   APPearance TURBID (*)    Hgb urine dipstick MODERATE (*)    Protein, ur 30 (*)    Nitrite POSITIVE (*)    Leukocytes, UA MODERATE (*)    All other components within normal limits  URINALYSIS, MICROSCOPIC (REFLEX) - Abnormal; Notable for the following components:   Bacteria, UA MANY (*)    All other components within normal limits  URINE CULTURE  LIPASE, BLOOD  CBC  I-STAT BETA HCG BLOOD, ED ([facility], [facility], [facility] ONLY)  I-STAT CG4 LACTIC ACID, ED    EKG None  Radiology No results found.  Procedures SUPRAPUBIC TUBE PLACEMENT Date/Time: 03/24/2018 8:12 AM Performed by: Missael Ferrari, Latanya MaudlinMartha L, MD Authorized by: Mead Slane, Latanya MaudlinMartha L, MD   Consent:    Consent obtained:  Verbal   Consent given by:  Patient Anesthesia (see MAR for exact dosages):    Anesthesia method:  None Procedure details:    Complexity:  Complex   Catheter type:  Foley   Catheter size:  22 Fr   Ultrasound guidance: no     Number of attempts:  1   Urine  characteristics:  Foul-smelling, yellow and cloudy Post-procedure details:    Patient tolerance of procedure:  Tolerated well, no immediate complications   (including critical care time)  Medications Ordered in ED Medications  fentaNYL (SUBLIMAZE) injection 50 mcg (50 mcg Intravenous Given 03/24/18 0746)  cefTRIAXone (ROCEPHIN) 1 g in sodium chloride 0.9 % 100 mL IVPB (0 g Intravenous Stopped 03/24/18 1049)     Initial Impression / Assessment and Plan / ED Course  I have reviewed the triage vital signs and the nursing notes.  Pertinent labs & imaging results that were available during my care of the patient were reviewed by me and considered in my medical decision making (see chart for details).   bladder scan showed > 500cc, not able to flush foley.  Suprapubic catheter replaced with 62F which is the size she came in with.  Large amount of thick yellow urine with lots of sediment returned.  Urine draining easily into foley.   10:57 AM pt has gotten rocephin, she has stated she does not want to return to her facility- consulted case management who will review case and give recommendations   Final Clinical Impressions(s) / ED Diagnoses   Final diagnoses:  Obstruction of suprapubic catheter, initial encounter [facility]([name])    ED Discharge Orders        Ordered    cephALEXin (KEFLEX) 500 MG capsule  4 times daily     03/24/18 1058       Lilah Mijangos, Latanya MaudlinMartha L, MD 03/24/18 1334

## 2018-03-24 NOTE — ED Notes (Signed)
50 mcg Fentanyl wasted with [place], RCharity fundraiser

## 2018-03-24 NOTE — Discharge Instructions (Signed)
Return to the ED with any concerns including fever, vomiting and not able to keep down liquids or your medications, catheter not draining, or any other alarming symptoms

## 2018-03-24 NOTE — ED Triage Notes (Addendum)
Per EMS, [facility]. Pt c/o abd/suprapubic pain that started yesterday. Pt was seen at pcp 2 weeks ago for obstructed catheter & pain and reports same sx. Pt reports staff is supposed to irrigate her Foley 3x/day but pt reports staff has only irrigated it 2x/day. Facility called ems stating her catheter is obstructed again. Pt also reports HA x2 days. EMS VSS. BP 110/70, P 100, 94% room air, R 20. Pt is A&O and totally dependent.

## 2018-03-25 LAB — URINE CULTURE

## 2018-03-27 DIAGNOSIS — K5909 Other constipation: Secondary | ICD-10-CM | POA: Diagnosis not present

## 2018-03-27 DIAGNOSIS — N318 Other neuromuscular dysfunction of bladder: Secondary | ICD-10-CM | POA: Diagnosis not present

## 2018-03-27 DIAGNOSIS — R338 Other retention of urine: Secondary | ICD-10-CM | POA: Diagnosis not present

## 2018-03-31 ENCOUNTER — Ambulatory Visit: Payer: Medicare Other | Admitting: Physical Medicine & Rehabilitation

## 2018-03-31 ENCOUNTER — Ambulatory Visit: Payer: Medicare Other

## 2018-03-31 ENCOUNTER — Encounter

## 2018-04-01 DIAGNOSIS — R0989 Other specified symptoms and signs involving the circulatory and respiratory systems: Secondary | ICD-10-CM | POA: Diagnosis not present

## 2018-04-01 DIAGNOSIS — R05 Cough: Secondary | ICD-10-CM | POA: Diagnosis not present

## 2018-04-10 DIAGNOSIS — N319 Neuromuscular dysfunction of bladder, unspecified: Secondary | ICD-10-CM | POA: Diagnosis not present

## 2018-04-10 DIAGNOSIS — Z466 Encounter for fitting and adjustment of urinary device: Secondary | ICD-10-CM | POA: Diagnosis not present

## 2018-04-10 DIAGNOSIS — Z9359 Other cystostomy status: Secondary | ICD-10-CM | POA: Diagnosis not present

## 2018-04-13 ENCOUNTER — Telehealth (INDEPENDENT_AMBULATORY_CARE_PROVIDER_SITE_OTHER): Payer: Self-pay | Admitting: Family

## 2018-04-13 NOTE — Telephone Encounter (Signed)
[name] [name] called to request appointment to have Baclofen pump checked. She complained of increased spasticity in her legs in the last 2 weeks. She has not heard an alarm from the pump and is not due for refill until July. She has not been sick or had a known infection. I scheduled her to see Dr Sharene Skeans on May 15th at 11:30. Because she relies on [employer] transportation, I asked her to arrive by 11AM. [name] [name] agreed with these plans. TG

## 2018-04-13 NOTE — Telephone Encounter (Signed)
Thank you I agree with these plans.

## 2018-04-15 ENCOUNTER — Ambulatory Visit (INDEPENDENT_AMBULATORY_CARE_PROVIDER_SITE_OTHER): Payer: Medicare Other | Admitting: Pediatrics

## 2018-04-15 ENCOUNTER — Encounter (INDEPENDENT_AMBULATORY_CARE_PROVIDER_SITE_OTHER): Payer: Self-pay | Admitting: Pediatrics

## 2018-04-15 DIAGNOSIS — G249 Dystonia, unspecified: Secondary | ICD-10-CM

## 2018-04-15 DIAGNOSIS — G808 Other cerebral palsy: Secondary | ICD-10-CM

## 2018-04-15 NOTE — Patient Instructions (Signed)
I hope this will lessen the stiffness in your legs.  If it goes too far you have trouble bearing weight on them and you will need to get up with me.  I will work you in tomorrow and will not leave the office until you can make it over here.  If this does not take care of your stiffness, give this a few days and get back with me on Monday.

## 2018-04-15 NOTE — Progress Notes (Signed)
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  She is benefited from recent increases of baclofen which has lessened her spasticity though not so much that she cannot bear weight on her legs.  She has become more active.    On May 13 she called to inform me that she had increased stiffness in her legs of 2 weeks duration.  She has not been sick and did not hurt her alarm.  As a result of this we brought her in for evaluation today.  Procedure: Reprogramming intrathecal baclofen pump  The device was interrogated and showed a complex continuous infusion of baclofen that has a basal rate of 20.73mcg per hour. The patient receives bolus doses of 27 mcg delivered at midnight, 6 AM, 12 noon, and 6 PM, for total of 514. per day. I reprogrammed to increase the basal rate to 21.5 mcg/day the infusion rate today micrograms per hour.  Total daily dose 525.7 mcg.  This represented a 2% increase.  Her reservoir alarm date is June 25, 2018. ERI is 31 months.  Review of Systems: A complete review of systems was remarkable for experiencing multiple muscle spasms, all other systems reviewed and negative.  Past [name] History Diagnosis Date  . Abdominal pain 01/02/2012   Overview:  Overview:  Per Previous pcp note- Dr. Ta--Pt has history of endometriosis and had DUB in 2011.  She She had u/s that showed normal endometrial stripe.  She had endometrial bx that was wnl.  She has a lot of abd cramping every month.  This cramping was sometimes not relieved with hydrocodone.  She was referred to  [name] for IUD placement to help endometriosis and abd cramping.  [name]  . Abdominal pain 01/02/2012   Overview:  Overview:  Per Previous pcp note- Dr. Ta--Pt has history of endometriosis and had DUB in 2011.  She She had u/s that showed normal endometrial stripe.  She had endometrial bx that was wnl.  She has a lot of abd cramping every month.  This cramping was sometimes not relieved with hydrocodone.  She was referred to [name] for IUD placement to help endometriosis and abd cramping.  [name] was placed 04/30/11 by [name] [name].  Pt still complains of abd cramping, which I am treating with hydrocodone and ultram. Pt reports some initial relief of pain from IUD, but states it has now returned back to similar to previous cramping. Gave patient a shot of Toradol 60 mg IM today-since positive exacerbation of abdominal cramping during past 2 weeks. Physical exam reassuring. Patient may need to [name] further workup since she is plugged in with the GYN providers there for further treatment options regarding endometriosis and abdominal cramping. The dysfunctional uterine bleeding now well contr  . Acute cystitis with hematuria   . Acute cystitis without hematuria   . Acute respiratory failure with hypoxia ([name])   . Anemia   . Cellulitis 12/22/2014  . Cerebral palsy ([name])    Spastic Cerebral palsy, mentally intact  . Contracture, joint, multiple sites    Electric wheelchair, uses left hand to operate chair.   . Depression   .  Disease of female genital organs 10/24/2010   Overview:  Overview:  Pt has history of endometriosis and had DUB in 2011.  She had u/s that showed normal endometrial stripe.  She had endometrial bx that was wnl.  She has a lot of abd cramping every month.  This cramping was sometimes not relieved with hydrocodone.  She was referred to [name] for IUD placement to help endometriosis and abd cramping.  [name] was placed 04/30/11 by [name] [name].  Pt still complains of  abd cramping, which was treated with hydrocodone and ultram.   Last seen at [name] by OB/GYN 12/24/11, given Depo Lupron injection.  Ordering CT scan to r/o additional cause of abdominal pain (pt with known endometriosis and will not tolerate vaginal U/S, pain not improved s/p [name] or with Lupron injection-- only caused hot flashes and amenorrhea, no pain relief).  Also made referral to pain clinic for better pain control.  Do not think hysterectomy would help with pain, and concern for contamination of baclofen pump if did proceed with surgery.    8/[facility]/13[name]: Pt was seen by   . [name]   . Dysarthria   . Dyspnea and respiratory abnormality 02/04/2011   Overview:  Overview:  Pt has history of recurrent PE and is on chronic coumadin.  Pt has been complaining of dyspnea that she describes as chest tightness.  She has been to the ER multiple times for this.  Usually she gets CTA and serial CE.  She has had at least 6 CTA in a period of since 2011.  She also has an O2 requirement. It is unclear why pt feels dyspneic, complains of chest tightness, or has O2 requirement.  I thought that her complaint may be from deconditioning and referred her to PT.  Unfortunately Medicaid only pays for 4 PT visits.  Pt should be doing these exercises at home, but she has not.  I have referred her to [name], and she has been seen by [name].  He did not understand the etiology of her dyspnea and O2 requirement.  He will continue to follow her.   Last Assessment & Plan:  Pt presents with concerns for chest tightness and dyspnea.  She was seen in ED yesterday and discharged home.  While there she had a CT chest 04/20/11 showed 1.  Multifocal degradation as detailed above.  N  . Endometriosis   . GERD (gastroesophageal reflux disease)   . Gram positive sepsis ([name]) 10/24/2014  . Gross hematuria 03/12/2013   Overview:  Overview:  status post replacement of suprapubic tube 03/04/2013  Last  Assessment & Plan:  Recent urine culture collected in ED on 5/4 did not result in significant growth. Moreover, patient has been asymptomatic with normal WBC, no fever and no true symptoms making symptomatic UTI less likely.  Will continue to monitor for any sign of infection.  Patient's xarelto had been held during her recent hospitalization due to hematuria. This has since been re-started. Monitor bleeding.    [name] Kitchen HCAP (healthcare-associated pneumonia) 10/25/2014  . HCAP (healthcare-associated pneumonia) 11/24/2017  . History of anticoagulant therapy 10/21/2012  . History of endometriosis 10/2012   OBGYN [facility]: Laparoscopic Endometrial Ablation, TVH,  . History of recurrent UTIs   . Hypertension   . HYPERTENSION, BENIGN 01/31/2010   Qualifier: Diagnosis of  By: [name], MD, [name] Incontinent of feces 03/11/2016  . Infantile cerebral palsy ([name]) 01/29/2007   Overview:  She has  spastic CP.  She is mentally intact.  She is wheelchair bound.  She is wheelchair bound for ambulation      . Intracervical pessary 05/07/2011   Overview:  Overview:  Placed by [name] 04/30/11 to treat DUB and Endometriosis.  She is seen at GYN clinic at [name] but was considered a poor surgical candidate and referred her to [name].  For DUB she had pelvic ultrasound that showed thin stripe and she had endometrial Bx by Dr Jennette Kettle in GYN clinic, which was negative.     . Macroscopic hematuria 03/10/2013   status post replacement of suprapubic tube 03/04/2013   . Migraine   . OCD (obsessive compulsive disorder)   . Parapneumonic effusion 10/25/2014  . Presence of intrathecal baclofen pump 03/07/2014   R LQ. Insertion T10.    [name] Kitchen Psychiatric illness 10/31/2014  . Pulmonary embolism ([name])    Lifetime Coumadin  . Pulmonary embolism ([name]) 2011, 01/2011   Will be on lifetime coumadin  . Recurrent pulmonary embolism ([name]) 05/03/2011   Pt has history of recurrent PE and is on chronic coumadin.  Pt has been complaining of  dyspnea that she describes as chest tightness.  She has been to the ER multiple times for this.  Usually she gets CTA and serial CE.  She has had at least 6 CTA in a period of since 2011.  She also has an O2 requirement. It is unclear why pt feels dyspneic, complains of chest tightness, or has O2 requirement.  I thought that her complaint may be from deconditioning and referred her to PT.  Unfortunately Medicaid only pays for 4 PT visits.  Pt should be doing these exercises at home, but she has not.  I have referred her to [name], and she has been seen by [name].  He did not understand the etiology of her dyspnea and O2 requirement.  He will continue to follow her.     . Seborrheic keratosis 01/18/2011  . Seizures ([name]) 02/12/2016  . Spasticity 01/05/2014  . Transient alteration of awareness, recurrent 09/08/2006   Recurrent episodes where Manuelita  loses contact with her world and that have been studied thoroughly and appear nonepileptic in nature per Dr Terrace Arabia (neurology) The patient has had episodes starting in 2007 that initially began 1-4 times a day during which time she would have periods of unawareness followed by confusion. This continuous video EEG monitoring performed from October 8-12, 2007. Had   . Urethra dilated and patulous 07/03/2015   Patulous, Dilated Urethra. 5mL balloon on a 22-Fr catheter hoping this would prevent the bladder spasm from pushing the balloon down the urethra (Dr Logan Bores, Urology [facility], July 2016)    Hospitalizations: No., Head Injury: No., Nervous System Infections: No., Immunizations up to date: Yes.    Birth History Extreme prematurity  Behavior History Anxiety and depression  Surgical History Procedure Laterality Date  . ABDOMINAL HYSTERECTOMY    . APPENDECTOMY    . BACLOFEN PUMP REFILL     x 3 times  . CARPAL TUNNEL RELEASE  08/2008   Dr Teressa Senter  . CESAREAN SECTION     x 2  . CHOLECYSTECTOMY  10/14/2015   Procedure: LAPAROSCOPIC CHOLECYSTECTOMY;  Surgeon:  Violeta Gelinas, MD;  Location: [name];  Service: General;;  . COLPOSCOPY  06/2000  . INTRAUTERINE DEVICE INSERTION  04/30/11   Inserted by [name] for endometriosis  . LAPAROSCOPIC ASSISTED VAGINAL HYSTERECTOMY  10/27/2012  . MULTIPLE EXTRACTIONS WITH ALVEOLOPLASTY Bilateral  07/19/2013   Procedure: EXTRACTIONS #4, 4,09,81,19;  Surgeon: Francene Finders, DDS;  Location[name];  Service: Oral Surgery;  Laterality: Bilateral;  . NEUROMA SURGERY Left    anterior and posterior intersosseous  . PAIN PUMP IMPLANTATION N/A 03/07/2014   Procedure: baclofen pump revision/replacement and Catheter connection replacement;  Surgeon: Cristi Loron, MD;  Location: [facility];  Service: Neurosurgery;  Laterality: N/A;  baclofen pump revision/replacement and Catheter connection replacement  . PROGRAMABLE BACLOFEN PUMP REVISION  03/17/14   Battery Replacement   . TUBAL LIGATION  2003  . URETHRA SURGERY    . WRIST SURGERY  06/2010   Dr Dierdre Searles, hand surgeon, [name]   Family History family history includes Alzheimer's disease in her paternal grandfather; Asthma in her father; Breast cancer in her mother and paternal grandmother; Colon cancer in her maternal grandmother; Heart attack in her maternal grandfather. Family history is negative for migraines, seizures, intellectual disabilities, blindness, deafness, birth defects, chromosomal disorder, or autism.  Social History Socioeconomic History  . Marital status: Divorced    Spouse name: Not on file  . Number of children: 2  . Years of education: college  . Highest education level: Not on file  Occupational History    Employer: UNEMPLOYED  Social Needs  . Financial resource strain: Not on file  . Food insecurity:    Worry: Not on file    Inability: Not on file  . Transportation needs:    [name]: Not on file    Non-[name]: Not on file  Tobacco Use  . Smoking status: Never Smoker  . Smokeless tobacco: Never Used  Substance and Sexual  Activity  . Alcohol use: Yes    Alcohol/week: 0.6 oz    Types: 1 Glasses of wine per week    Comment: Drinks alcohol once a month.  . Drug use: No  . 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     She enjoys shopping.    Lives in a nursing facility[name] since 02[facility]    Ailene Ards, who has schizophrenia, MR, is father of daughter.  Homero Fellers and pt are no longer together.  Homero Fellers does not see [name]. Daughter is 36 yo, Stage manager, lives with pt.    Needs assistance with ADL's and IADL's--has personal care services 20 hrs/wk;     No tobacco, EtOH, drugs.    **Patient very concerned about her kids being taken from her.    **Case Manager: Jack Quarto [phone]    Patient lives [name]   Allergies Allergen Reactions  . Morphine Dermatitis and Other (See Comments)    Skin turned red   Physical Exam LMP 04/05/2011   Patient had significant spasticity in her legs and spasticity and some dystonia in her arms.  She is more tight than ordinarily.  I think that she would benefit from increasing her intrathecal baclofen.  Assessment 1.  Cerebral palsy, quadriplegia, G80..8. 2.  Athetoid cerebral palsy, G to the spasticity is worse.  We will increase her 80.3.  Discussion [name] has spasticity we will increase her basal rate by about 5%.  This turned out to be 2% on her total rate.  Plan Return visit June 25, 2018.  Patient will let me know if this was an adequate increase.   Medication List    Accurate as of 04/15/18 11:59 PM.      acetaminophen 500 MG  tablet Commonly known as:  TYLENOL Take 500-650 mg by mouth See admin instructions.  three times daily and 650 three times daily as needed for pain (do not exceed  in 24hrs)   amantadine 100 MG capsule Commonly known as:  SYMMETREL Take 100 mg by mouth daily.   baclofen 20 MG  tablet Commonly known as:  LIORESAL Take 20 mg by mouth 4 (four) times daily. For bladder spasms   cephALEXin 500 MG capsule Commonly known as:  KEFLEX Take 1 capsule (500 mg total) by mouth 4 (four) times daily.   CERTA-VITE PO Take 1 tablet by mouth daily.   clonazePAM 0.5 MG tablet Commonly known as:  KLONOPIN Take 1.5 tablets (0.75 mg total) by mouth 3 (three) times daily.   fentaNYL 12 MCG/HR Commonly known as:  DURAGESIC - dosed mcg/hr Place 1 patch (12.5 mcg total) onto the skin every 3 (three) days.   ferrous sulfate 325 (65 FE) MG tablet Take 325 mg by mouth daily with breakfast.   FLUoxetine HCl 60 MG Tabs Take 60 mg by mouth daily.   lactose free nutrition Liqd Take 237 mLs by mouth 2 (two) times daily between meals.   lamoTRIgine 100 MG tablet Commonly known as:  LAMICTAL Take 100 mg by mouth at bedtime.   lubiprostone 24 MCG capsule Commonly known as:  AMITIZA Take 1 capsule (24 mcg total) by mouth 2 (two) times daily with a meal.   Melatonin 5 MG Tabs Take 5 mg by mouth at bedtime.   OVER THE COUNTER MEDICATION Take 1 Package by mouth 3 (three) times daily. Magic cup   oxybutynin 5 MG tablet Commonly known as:  DITROPAN Take 5 mg by mouth 4 (four) times daily.   OxyCODONE HCl (Abuse Deter) 5 MG Taba Commonly known as:  OXAYDO Take 5 mg by mouth every 4 (four) hours as needed (Pain).   polyethylene glycol powder powder Commonly known as:  GLYCOLAX/MIRALAX Take 1  1/2 dose  daily in 12 ounces of fluid every day.   QUEtiapine 100 MG tablet Commonly known as:  SEROQUEL Take 1 tablet (100 mg total) by mouth at bedtime.   tiZANidine 4 MG capsule Commonly known as:  ZANAFLEX Take 4 mg by mouth 2 (two) times daily.   warfarin 6 MG tablet Commonly known as:  COUMADIN Take as directed by the anticoagulation clinic. If you are unsure how to take this medication, talk to your nurse or doctor. Original instructions:  Take 6 mg by mouth daily.    The  medication list was reviewed and reconciled. All changes or newly prescribed medications were explained.  A complete medication list was provided to the patient/caregiver.  Deetta Perla MD

## 2018-04-17 DIAGNOSIS — F4322 Adjustment disorder with anxiety: Secondary | ICD-10-CM | POA: Diagnosis not present

## 2018-04-20 ENCOUNTER — Telehealth (INDEPENDENT_AMBULATORY_CARE_PROVIDER_SITE_OTHER): Payer: Self-pay | Admitting: Pediatrics

## 2018-04-20 DIAGNOSIS — K117 Disturbances of salivary secretion: Secondary | ICD-10-CM | POA: Diagnosis not present

## 2018-04-20 DIAGNOSIS — G8929 Other chronic pain: Secondary | ICD-10-CM | POA: Diagnosis not present

## 2018-04-20 NOTE — Telephone Encounter (Signed)
°  Who's calling (name and relationship to patient) : JULIET -UNIT MGR  Best contact number: [phone]  Provider they see: HICKLING  Reason for call: Faylene is having back spasms and stating Baclofen did not work. They were advised to call if any issues. Please call.     PRESCRIPTION REFILL ONLY  Name of prescription:  Pharmacy:

## 2018-04-20 NOTE — Telephone Encounter (Signed)
Patient called in to the office today crying and stating that she cannot control her body. When the patient was in last week, she complained of muscle spasms

## 2018-04-21 ENCOUNTER — Telehealth: Payer: Self-pay | Admitting: Pediatrics

## 2018-04-21 DIAGNOSIS — M542 Cervicalgia: Secondary | ICD-10-CM | POA: Diagnosis not present

## 2018-04-21 DIAGNOSIS — M4322 Fusion of spine, cervical region: Secondary | ICD-10-CM | POA: Diagnosis not present

## 2018-04-21 DIAGNOSIS — M6281 Muscle weakness (generalized): Secondary | ICD-10-CM | POA: Diagnosis not present

## 2018-04-21 NOTE — Telephone Encounter (Signed)
I called and left a message on her voicemail that I will call again tomorrow. TG

## 2018-04-21 NOTE — Telephone Encounter (Signed)
See the next note.

## 2018-04-21 NOTE — Telephone Encounter (Signed)
I spoke with the nurse taking care of the patient she received some muscle relaxer last night which helped her spasms and some pain pills which eliminated the pain.  She is not having any symptoms this morning according to her nurse.  We will observe for now.  I asked the nurse to pass on the message to [place].

## 2018-04-21 NOTE — Telephone Encounter (Signed)
°  Who's calling (name and relationship to patient) : Alcario Drought contact number: no number given on voicemail   Provider they see: hickling  Reason for call: patient left voicemail requesting to speak with [name]

## 2018-04-22 DIAGNOSIS — M542 Cervicalgia: Secondary | ICD-10-CM | POA: Diagnosis not present

## 2018-04-22 DIAGNOSIS — M4322 Fusion of spine, cervical region: Secondary | ICD-10-CM | POA: Diagnosis not present

## 2018-04-22 DIAGNOSIS — M6281 Muscle weakness (generalized): Secondary | ICD-10-CM | POA: Diagnosis not present

## 2018-04-23 DIAGNOSIS — M6281 Muscle weakness (generalized): Secondary | ICD-10-CM | POA: Diagnosis not present

## 2018-04-23 DIAGNOSIS — M542 Cervicalgia: Secondary | ICD-10-CM | POA: Diagnosis not present

## 2018-04-23 DIAGNOSIS — M4322 Fusion of spine, cervical region: Secondary | ICD-10-CM | POA: Diagnosis not present

## 2018-04-24 NOTE — Telephone Encounter (Signed)
I left 2 additional messages for [name] [name] and have not received a phone call back. I will await her call. TG

## 2018-04-28 DIAGNOSIS — F4322 Adjustment disorder with anxiety: Secondary | ICD-10-CM | POA: Diagnosis not present

## 2018-04-29 DIAGNOSIS — M6281 Muscle weakness (generalized): Secondary | ICD-10-CM | POA: Diagnosis not present

## 2018-04-29 DIAGNOSIS — M542 Cervicalgia: Secondary | ICD-10-CM | POA: Diagnosis not present

## 2018-04-29 DIAGNOSIS — M4322 Fusion of spine, cervical region: Secondary | ICD-10-CM | POA: Diagnosis not present

## 2018-04-30 DIAGNOSIS — M542 Cervicalgia: Secondary | ICD-10-CM | POA: Diagnosis not present

## 2018-04-30 DIAGNOSIS — M4322 Fusion of spine, cervical region: Secondary | ICD-10-CM | POA: Diagnosis not present

## 2018-04-30 DIAGNOSIS — M6281 Muscle weakness (generalized): Secondary | ICD-10-CM | POA: Diagnosis not present

## 2018-05-04 DIAGNOSIS — M542 Cervicalgia: Secondary | ICD-10-CM | POA: Diagnosis not present

## 2018-05-04 DIAGNOSIS — M6281 Muscle weakness (generalized): Secondary | ICD-10-CM | POA: Diagnosis not present

## 2018-05-04 DIAGNOSIS — M4322 Fusion of spine, cervical region: Secondary | ICD-10-CM | POA: Diagnosis not present

## 2018-05-06 DIAGNOSIS — M6281 Muscle weakness (generalized): Secondary | ICD-10-CM | POA: Diagnosis not present

## 2018-05-06 DIAGNOSIS — M4322 Fusion of spine, cervical region: Secondary | ICD-10-CM | POA: Diagnosis not present

## 2018-05-06 DIAGNOSIS — M542 Cervicalgia: Secondary | ICD-10-CM | POA: Diagnosis not present

## 2018-05-07 DIAGNOSIS — M4322 Fusion of spine, cervical region: Secondary | ICD-10-CM | POA: Diagnosis not present

## 2018-05-07 DIAGNOSIS — M542 Cervicalgia: Secondary | ICD-10-CM | POA: Diagnosis not present

## 2018-05-07 DIAGNOSIS — M6281 Muscle weakness (generalized): Secondary | ICD-10-CM | POA: Diagnosis not present

## 2018-05-08 DIAGNOSIS — Z9359 Other cystostomy status: Secondary | ICD-10-CM | POA: Diagnosis not present

## 2018-05-11 DIAGNOSIS — M4322 Fusion of spine, cervical region: Secondary | ICD-10-CM | POA: Diagnosis not present

## 2018-05-11 DIAGNOSIS — R51 Headache: Secondary | ICD-10-CM | POA: Diagnosis not present

## 2018-05-11 DIAGNOSIS — M542 Cervicalgia: Secondary | ICD-10-CM | POA: Diagnosis not present

## 2018-05-11 DIAGNOSIS — R5381 Other malaise: Secondary | ICD-10-CM | POA: Diagnosis not present

## 2018-05-11 DIAGNOSIS — M6281 Muscle weakness (generalized): Secondary | ICD-10-CM | POA: Diagnosis not present

## 2018-05-12 DIAGNOSIS — M6281 Muscle weakness (generalized): Secondary | ICD-10-CM | POA: Diagnosis not present

## 2018-05-12 DIAGNOSIS — M542 Cervicalgia: Secondary | ICD-10-CM | POA: Diagnosis not present

## 2018-05-12 DIAGNOSIS — M4322 Fusion of spine, cervical region: Secondary | ICD-10-CM | POA: Diagnosis not present

## 2018-05-13 DIAGNOSIS — N390 Urinary tract infection, site not specified: Secondary | ICD-10-CM | POA: Diagnosis not present

## 2018-05-13 DIAGNOSIS — M4322 Fusion of spine, cervical region: Secondary | ICD-10-CM | POA: Diagnosis not present

## 2018-05-13 DIAGNOSIS — M542 Cervicalgia: Secondary | ICD-10-CM | POA: Diagnosis not present

## 2018-05-13 DIAGNOSIS — I10 Essential (primary) hypertension: Secondary | ICD-10-CM | POA: Diagnosis not present

## 2018-05-13 DIAGNOSIS — M6281 Muscle weakness (generalized): Secondary | ICD-10-CM | POA: Diagnosis not present

## 2018-05-14 DIAGNOSIS — M4312 Spondylolisthesis, cervical region: Secondary | ICD-10-CM | POA: Diagnosis not present

## 2018-05-14 DIAGNOSIS — M4142 Neuromuscular scoliosis, cervical region: Secondary | ICD-10-CM | POA: Diagnosis not present

## 2018-05-14 DIAGNOSIS — Z981 Arthrodesis status: Secondary | ICD-10-CM | POA: Diagnosis not present

## 2018-05-15 DIAGNOSIS — M6281 Muscle weakness (generalized): Secondary | ICD-10-CM | POA: Diagnosis not present

## 2018-05-15 DIAGNOSIS — M4322 Fusion of spine, cervical region: Secondary | ICD-10-CM | POA: Diagnosis not present

## 2018-05-15 DIAGNOSIS — M542 Cervicalgia: Secondary | ICD-10-CM | POA: Diagnosis not present

## 2018-05-16 DIAGNOSIS — M6281 Muscle weakness (generalized): Secondary | ICD-10-CM | POA: Diagnosis not present

## 2018-05-16 DIAGNOSIS — M4322 Fusion of spine, cervical region: Secondary | ICD-10-CM | POA: Diagnosis not present

## 2018-05-16 DIAGNOSIS — M542 Cervicalgia: Secondary | ICD-10-CM | POA: Diagnosis not present

## 2018-05-18 DIAGNOSIS — M4322 Fusion of spine, cervical region: Secondary | ICD-10-CM | POA: Diagnosis not present

## 2018-05-18 DIAGNOSIS — M6281 Muscle weakness (generalized): Secondary | ICD-10-CM | POA: Diagnosis not present

## 2018-05-18 DIAGNOSIS — M542 Cervicalgia: Secondary | ICD-10-CM | POA: Diagnosis not present

## 2018-05-19 DIAGNOSIS — M542 Cervicalgia: Secondary | ICD-10-CM | POA: Diagnosis not present

## 2018-05-19 DIAGNOSIS — M4322 Fusion of spine, cervical region: Secondary | ICD-10-CM | POA: Diagnosis not present

## 2018-05-19 DIAGNOSIS — M6281 Muscle weakness (generalized): Secondary | ICD-10-CM | POA: Diagnosis not present

## 2018-05-20 DIAGNOSIS — I484 Atypical atrial flutter: Secondary | ICD-10-CM | POA: Diagnosis not present

## 2018-05-20 DIAGNOSIS — F331 Major depressive disorder, recurrent, moderate: Secondary | ICD-10-CM | POA: Diagnosis not present

## 2018-05-20 DIAGNOSIS — M6281 Muscle weakness (generalized): Secondary | ICD-10-CM | POA: Diagnosis not present

## 2018-05-20 DIAGNOSIS — M4322 Fusion of spine, cervical region: Secondary | ICD-10-CM | POA: Diagnosis not present

## 2018-05-20 DIAGNOSIS — M542 Cervicalgia: Secondary | ICD-10-CM | POA: Diagnosis not present

## 2018-05-21 DIAGNOSIS — G809 Cerebral palsy, unspecified: Secondary | ICD-10-CM | POA: Diagnosis not present

## 2018-05-21 DIAGNOSIS — M542 Cervicalgia: Secondary | ICD-10-CM | POA: Diagnosis not present

## 2018-05-21 DIAGNOSIS — M129 Arthropathy, unspecified: Secondary | ICD-10-CM | POA: Diagnosis not present

## 2018-05-21 DIAGNOSIS — M6281 Muscle weakness (generalized): Secondary | ICD-10-CM | POA: Diagnosis not present

## 2018-05-21 DIAGNOSIS — Z79899 Other long term (current) drug therapy: Secondary | ICD-10-CM | POA: Diagnosis not present

## 2018-05-21 DIAGNOSIS — M4322 Fusion of spine, cervical region: Secondary | ICD-10-CM | POA: Diagnosis not present

## 2018-05-21 DIAGNOSIS — E559 Vitamin D deficiency, unspecified: Secondary | ICD-10-CM | POA: Diagnosis not present

## 2018-05-22 DIAGNOSIS — M4322 Fusion of spine, cervical region: Secondary | ICD-10-CM | POA: Diagnosis not present

## 2018-05-22 DIAGNOSIS — I484 Atypical atrial flutter: Secondary | ICD-10-CM | POA: Diagnosis not present

## 2018-05-22 DIAGNOSIS — M6281 Muscle weakness (generalized): Secondary | ICD-10-CM | POA: Diagnosis not present

## 2018-05-22 DIAGNOSIS — M542 Cervicalgia: Secondary | ICD-10-CM | POA: Diagnosis not present

## 2018-05-23 DIAGNOSIS — I82401 Acute embolism and thrombosis of unspecified deep veins of right lower extremity: Secondary | ICD-10-CM | POA: Diagnosis not present

## 2018-05-25 ENCOUNTER — Emergency Department (HOSPITAL_COMMUNITY): Payer: Medicare Other

## 2018-05-25 ENCOUNTER — Encounter (HOSPITAL_COMMUNITY): Payer: Self-pay | Admitting: Internal Medicine

## 2018-05-25 ENCOUNTER — Inpatient Hospital Stay (HOSPITAL_COMMUNITY)
Admission: EM | Admit: 2018-05-25 | Discharge: 2018-05-29 | DRG: 699 | Disposition: A | Discharge status: Skilled Nursing Facility | Payer: Medicare Other | Attending: Internal Medicine | Admitting: Internal Medicine

## 2018-05-25 ENCOUNTER — Other Ambulatory Visit: Payer: Self-pay

## 2018-05-25 DIAGNOSIS — G808 Other cerebral palsy: Secondary | ICD-10-CM | POA: Diagnosis present

## 2018-05-25 DIAGNOSIS — G801 Spastic diplegic cerebral palsy: Secondary | ICD-10-CM | POA: Diagnosis not present

## 2018-05-25 DIAGNOSIS — I484 Atypical atrial flutter: Secondary | ICD-10-CM | POA: Diagnosis not present

## 2018-05-25 DIAGNOSIS — Z86711 Personal history of pulmonary embolism: Secondary | ICD-10-CM

## 2018-05-25 DIAGNOSIS — E162 Hypoglycemia, unspecified: Secondary | ICD-10-CM | POA: Diagnosis not present

## 2018-05-25 DIAGNOSIS — Y846 Urinary catheterization as the cause of abnormal reaction of the patient, or of later complication, without mention of misadventure at the time of the procedure: Secondary | ICD-10-CM | POA: Diagnosis present

## 2018-05-25 DIAGNOSIS — Z885 Allergy status to narcotic agent status: Secondary | ICD-10-CM

## 2018-05-25 DIAGNOSIS — K219 Gastro-esophageal reflux disease without esophagitis: Secondary | ICD-10-CM | POA: Diagnosis not present

## 2018-05-25 DIAGNOSIS — R51 Headache: Secondary | ICD-10-CM | POA: Diagnosis not present

## 2018-05-25 DIAGNOSIS — N390 Urinary tract infection, site not specified: Secondary | ICD-10-CM | POA: Diagnosis not present

## 2018-05-25 DIAGNOSIS — R509 Fever, unspecified: Secondary | ICD-10-CM | POA: Diagnosis not present

## 2018-05-25 DIAGNOSIS — R000 Tachycardia, unspecified: Secondary | ICD-10-CM | POA: Diagnosis not present

## 2018-05-25 DIAGNOSIS — R008 Other abnormalities of heart beat: Secondary | ICD-10-CM | POA: Diagnosis not present

## 2018-05-25 DIAGNOSIS — Z79899 Other long term (current) drug therapy: Secondary | ICD-10-CM

## 2018-05-25 DIAGNOSIS — G4489 Other headache syndrome: Secondary | ICD-10-CM | POA: Diagnosis not present

## 2018-05-25 DIAGNOSIS — F429 Obsessive-compulsive disorder, unspecified: Secondary | ICD-10-CM | POA: Diagnosis present

## 2018-05-25 DIAGNOSIS — F329 Major depressive disorder, single episode, unspecified: Secondary | ICD-10-CM | POA: Diagnosis present

## 2018-05-25 DIAGNOSIS — I2699 Other pulmonary embolism without acute cor pulmonale: Secondary | ICD-10-CM | POA: Diagnosis present

## 2018-05-25 DIAGNOSIS — R519 Headache, unspecified: Secondary | ICD-10-CM

## 2018-05-25 DIAGNOSIS — T83518A Infection and inflammatory reaction due to other urinary catheter, initial encounter: Secondary | ICD-10-CM | POA: Diagnosis not present

## 2018-05-25 DIAGNOSIS — R0602 Shortness of breath: Secondary | ICD-10-CM | POA: Diagnosis not present

## 2018-05-25 DIAGNOSIS — F419 Anxiety disorder, unspecified: Secondary | ICD-10-CM | POA: Diagnosis present

## 2018-05-25 DIAGNOSIS — N3000 Acute cystitis without hematuria: Secondary | ICD-10-CM | POA: Diagnosis not present

## 2018-05-25 DIAGNOSIS — T68XXXA Hypothermia, initial encounter: Secondary | ICD-10-CM | POA: Diagnosis not present

## 2018-05-25 DIAGNOSIS — Z7901 Long term (current) use of anticoagulants: Secondary | ICD-10-CM

## 2018-05-25 DIAGNOSIS — Z87440 Personal history of urinary (tract) infections: Secondary | ICD-10-CM

## 2018-05-25 DIAGNOSIS — Z993 Dependence on wheelchair: Secondary | ICD-10-CM

## 2018-05-25 DIAGNOSIS — K5900 Constipation, unspecified: Secondary | ICD-10-CM | POA: Diagnosis not present

## 2018-05-25 DIAGNOSIS — R109 Unspecified abdominal pain: Secondary | ICD-10-CM | POA: Diagnosis not present

## 2018-05-25 DIAGNOSIS — E161 Other hypoglycemia: Secondary | ICD-10-CM | POA: Diagnosis not present

## 2018-05-25 DIAGNOSIS — Z1612 Extended spectrum beta lactamase (ESBL) resistance: Secondary | ICD-10-CM | POA: Diagnosis present

## 2018-05-25 LAB — URINALYSIS, ROUTINE W REFLEX MICROSCOPIC
BILIRUBIN URINE: NEGATIVE
Glucose, UA: NEGATIVE mg/dL
HGB URINE DIPSTICK: NEGATIVE
Ketones, ur: NEGATIVE mg/dL
NITRITE: POSITIVE — AB
PROTEIN: NEGATIVE mg/dL
Specific Gravity, Urine: 1.011 (ref 1.005–1.030)
pH: 8 (ref 5.0–8.0)

## 2018-05-25 LAB — CBC WITH DIFFERENTIAL/PLATELET
BASOS ABS: 0 10*3/uL (ref 0.0–0.1)
BASOS PCT: 0 %
EOS ABS: 1 10*3/uL — AB (ref 0.0–0.7)
EOS PCT: 11 %
HCT: 39.8 % (ref 36.0–46.0)
HEMOGLOBIN: 12.9 g/dL (ref 12.0–15.0)
LYMPHS ABS: 2.1 10*3/uL (ref 0.7–4.0)
Lymphocytes Relative: 24 %
MCH: 29.3 pg (ref 26.0–34.0)
MCHC: 32.4 g/dL (ref 30.0–36.0)
MCV: 90.5 fL (ref 78.0–100.0)
Monocytes Absolute: 0.7 10*3/uL (ref 0.1–1.0)
Monocytes Relative: 8 %
NEUTROS PCT: 57 %
Neutro Abs: 5 10*3/uL (ref 1.7–7.7)
Platelets: 327 10*3/uL (ref 150–400)
RBC: 4.4 MIL/uL (ref 3.87–5.11)
RDW: 16.2 % — ABNORMAL HIGH (ref 11.5–15.5)
WBC: 8.7 10*3/uL (ref 4.0–10.5)

## 2018-05-25 LAB — COMPREHENSIVE METABOLIC PANEL
ALT: 16 U/L (ref 14–54)
AST: 17 U/L (ref 15–41)
Albumin: 4.1 g/dL (ref 3.5–5.0)
Alkaline Phosphatase: 89 U/L (ref 38–126)
Anion gap: 8 (ref 5–15)
BILIRUBIN TOTAL: 0.4 mg/dL (ref 0.3–1.2)
BUN: 12 mg/dL (ref 6–20)
CALCIUM: 9.1 mg/dL (ref 8.9–10.3)
CHLORIDE: 103 mmol/L (ref 101–111)
CO2: 29 mmol/L (ref 22–32)
CREATININE: 0.52 mg/dL (ref 0.44–1.00)
Glucose, Bld: 93 mg/dL (ref 65–99)
Potassium: 4.3 mmol/L (ref 3.5–5.1)
Sodium: 140 mmol/L (ref 135–145)
TOTAL PROTEIN: 7.9 g/dL (ref 6.5–8.1)

## 2018-05-25 LAB — PROTIME-INR
INR: 1.68
PROTHROMBIN TIME: 19.7 s — AB (ref 11.4–15.2)

## 2018-05-25 LAB — I-STAT CG4 LACTIC ACID, ED: LACTIC ACID, VENOUS: 1.09 mmol/L (ref 0.5–1.9)

## 2018-05-25 MED ORDER — LUBIPROSTONE 24 MCG PO CAPS
24.0000 ug | ORAL_CAPSULE | Freq: Two times a day (BID) | ORAL | Status: DC
  Administered 2018-05-26 – 2018-05-29 (×7): 24 ug via ORAL
  Filled 2018-05-25 (×8): qty 1

## 2018-05-25 MED ORDER — BUSPIRONE HCL 5 MG PO TABS
5.0000 mg | ORAL_TABLET | Freq: Three times a day (TID) | ORAL | Status: DC
  Administered 2018-05-25 – 2018-05-29 (×12): 5 mg via ORAL
  Filled 2018-05-25 (×12): qty 1

## 2018-05-25 MED ORDER — TIZANIDINE HCL 4 MG PO TABS
4.0000 mg | ORAL_TABLET | Freq: Two times a day (BID) | ORAL | Status: DC
  Administered 2018-05-25 – 2018-05-29 (×6): 4 mg via ORAL
  Filled 2018-05-25 (×8): qty 1

## 2018-05-25 MED ORDER — BACLOFEN 20 MG PO TABS
20.0000 mg | ORAL_TABLET | Freq: Four times a day (QID) | ORAL | Status: DC
  Administered 2018-05-25 – 2018-05-29 (×15): 20 mg via ORAL
  Filled 2018-05-25 (×15): qty 1

## 2018-05-25 MED ORDER — POLYETHYLENE GLYCOL 3350 17 G PO PACK
17.0000 g | PACK | Freq: Two times a day (BID) | ORAL | Status: DC
  Administered 2018-05-25 – 2018-05-29 (×6): 17 g via ORAL
  Filled 2018-05-25 (×7): qty 1

## 2018-05-25 MED ORDER — BISACODYL 10 MG RE SUPP: 10.0000 mg | RECTAL | Status: DC | PRN

## 2018-05-25 MED ORDER — MEROPENEM 1 G IV SOLR
1.0000 g | INTRAVENOUS | Status: AC
  Administered 2018-05-25: 1 g via INTRAVENOUS
  Filled 2018-05-25: qty 1

## 2018-05-25 MED ORDER — OXYCODONE HCL 5 MG PO TABS
5.0000 mg | ORAL_TABLET | ORAL | Status: DC | PRN
  Administered 2018-05-26 – 2018-05-29 (×9): 5 mg via ORAL
  Filled 2018-05-25 (×9): qty 1

## 2018-05-25 MED ORDER — ACETAMINOPHEN 325 MG PO TABS: 650.0000 mg | ORAL_TABLET | Freq: Three times a day (TID) | ORAL | Status: DC | PRN

## 2018-05-25 MED ORDER — METOCLOPRAMIDE HCL 5 MG/ML IJ SOLN
5.0000 mg | Freq: Once | INTRAMUSCULAR | Status: AC
  Administered 2018-05-25: 5 mg via INTRAVENOUS
  Filled 2018-05-25: qty 2

## 2018-05-25 MED ORDER — ONDANSETRON HCL 4 MG PO TABS
4.0000 mg | ORAL_TABLET | Freq: Four times a day (QID) | ORAL | Status: DC
  Administered 2018-05-25 – 2018-05-29 (×13): 4 mg via ORAL
  Filled 2018-05-25 (×13): qty 1

## 2018-05-25 MED ORDER — FLUOXETINE HCL 20 MG PO CAPS
80.0000 mg | ORAL_CAPSULE | Freq: Every day | ORAL | Status: DC
  Administered 2018-05-26 – 2018-05-29 (×4): 80 mg via ORAL
  Filled 2018-05-25 (×3): qty 4
  Filled 2018-05-25: qty 8

## 2018-05-25 MED ORDER — BUPRENORPHINE HCL 150 MCG BU FILM
150.0000 ug | ORAL_FILM | Freq: Two times a day (BID) | BUCCAL | Status: DC
  Filled 2018-05-25: qty 1

## 2018-05-25 MED ORDER — POLYETHYLENE GLYCOL 3350 17 GM/SCOOP PO POWD: 17.0000 g | Freq: Two times a day (BID) | ORAL | Status: DC

## 2018-05-25 MED ORDER — PIPERACILLIN-TAZOBACTAM 3.375 G IVPB 30 MIN
3.3750 g | Freq: Once | INTRAVENOUS | Status: AC
  Administered 2018-05-25: 3.375 g via INTRAVENOUS
  Filled 2018-05-25: qty 50

## 2018-05-25 MED ORDER — SODIUM CHLORIDE 0.9 % IV SOLN
INTRAVENOUS | Status: AC
  Administered 2018-05-25: 22:00:00 via INTRAVENOUS

## 2018-05-25 MED ORDER — BIOTENE DRY MOUTH MT LIQD: 15.0000 mL | OROMUCOSAL | Status: DC | PRN

## 2018-05-25 MED ORDER — ADULT MULTIVITAMIN LIQUID CH
15.0000 mL | Freq: Every day | ORAL | Status: DC
  Administered 2018-05-26: 15 mL via ORAL
  Filled 2018-05-25: qty 15

## 2018-05-25 MED ORDER — LACTULOSE 10 GM/15ML PO SOLN: 20.0000 g | Freq: Every day | ORAL | Status: DC | PRN

## 2018-05-25 MED ORDER — QUETIAPINE FUMARATE 25 MG PO TABS
150.0000 mg | ORAL_TABLET | Freq: Every day | ORAL | Status: DC
  Administered 2018-05-25 – 2018-05-28 (×4): 150 mg via ORAL
  Filled 2018-05-25 (×4): qty 2

## 2018-05-25 MED ORDER — SODIUM CHLORIDE 0.9 % IV SOLN
1.0000 g | Freq: Three times a day (TID) | INTRAVENOUS | Status: DC
  Administered 2018-05-26 – 2018-05-28 (×8): 1 g via INTRAVENOUS
  Filled 2018-05-25 (×9): qty 1

## 2018-05-25 MED ORDER — DIPHENHYDRAMINE HCL 50 MG/ML IJ SOLN
12.5000 mg | Freq: Once | INTRAMUSCULAR | Status: AC
  Administered 2018-05-25: 12.5 mg via INTRAVENOUS
  Filled 2018-05-25: qty 1

## 2018-05-25 MED ORDER — ACETAMINOPHEN 325 MG PO TABS
650.0000 mg | ORAL_TABLET | Freq: Once | ORAL | Status: AC
  Administered 2018-05-25: 650 mg via ORAL
  Filled 2018-05-25: qty 2

## 2018-05-25 MED ORDER — WARFARIN - PHARMACIST DOSING INPATIENT
Freq: Every day | Status: DC
Start: 2018-05-26 — End: 2018-05-29
  Administered 2018-05-27: 18:00:00

## 2018-05-25 MED ORDER — OXYBUTYNIN CHLORIDE 5 MG PO TABS
5.0000 mg | ORAL_TABLET | Freq: Four times a day (QID) | ORAL | Status: DC
  Administered 2018-05-25 – 2018-05-29 (×15): 5 mg via ORAL
  Filled 2018-05-25 (×15): qty 1

## 2018-05-25 MED ORDER — SODIUM CHLORIDE 0.9 % IV SOLN
1000.0000 mL | INTRAVENOUS | Status: DC
Start: 2018-05-25 — End: 2018-05-28
  Administered 2018-05-25 – 2018-05-27 (×5): 1000 mL via INTRAVENOUS

## 2018-05-25 MED ORDER — AMANTADINE HCL 100 MG PO CAPS
100.0000 mg | ORAL_CAPSULE | Freq: Every day | ORAL | Status: DC
  Administered 2018-05-26 – 2018-05-29 (×4): 100 mg via ORAL
  Filled 2018-05-25 (×4): qty 1

## 2018-05-25 MED ORDER — LAMOTRIGINE 100 MG PO TABS
100.0000 mg | ORAL_TABLET | Freq: Every day | ORAL | Status: DC
  Administered 2018-05-25 – 2018-05-28 (×4): 100 mg via ORAL
  Filled 2018-05-25 (×4): qty 1

## 2018-05-25 MED ORDER — MELATONIN 5 MG PO TABS
5.0000 mg | ORAL_TABLET | Freq: Every day | ORAL | Status: DC
  Administered 2018-05-25 – 2018-05-28 (×4): 5 mg via ORAL
  Filled 2018-05-25 (×5): qty 1

## 2018-05-25 MED ORDER — FERROUS SULFATE 325 (65 FE) MG PO TABS
325.0000 mg | ORAL_TABLET | Freq: Every day | ORAL | Status: DC
  Administered 2018-05-26 – 2018-05-29 (×4): 325 mg via ORAL
  Filled 2018-05-25 (×4): qty 1

## 2018-05-25 MED ORDER — WARFARIN SODIUM 6 MG PO TABS
9.0000 mg | ORAL_TABLET | ORAL | Status: AC
  Administered 2018-05-25: 9 mg via ORAL
  Filled 2018-05-25: qty 1

## 2018-05-25 MED ORDER — CLONAZEPAM 0.5 MG PO TABS
0.7500 mg | ORAL_TABLET | Freq: Three times a day (TID) | ORAL | Status: DC
  Administered 2018-05-25 – 2018-05-29 (×12): 0.75 mg via ORAL
  Filled 2018-05-25 (×12): qty 2

## 2018-05-25 NOTE — Progress Notes (Signed)
Pharmacy Antibiotic Note  [name] SlickerJudith [name] is a 42 y.o. female admitted on 05/25/2018 with UTI.  PMH significant for h/o ESBL UTI, Cerebral Palsy.  Pharmacy has been consulted for Meropenem dosing.  Plan:  Meropenem 1gm IV q8h  Follow renal function  Follow culture results/sensitivities  Height: 4\' 10"  (147.3 cm) Weight: 107 lb (48.5 kg) IBW/kg (Calculated) : 40.9  Temp (24hrs), Avg:99.4 F (37.4 C), Min:99.3 F (37.4 C), Max:99.5 F (37.5 C)  Recent Labs  Lab 05/25/18 1622 05/25/18 1629  WBC 8.7  --   CREATININE 0.52  --   LATICACIDVEN  --  1.09    Estimated Creatinine Clearance: 57.9 mL/min (by C-G formula based on SCr of 0.52 mg/dL).    Allergies  Allergen Reactions  . Morphine Dermatitis and Other (See Comments)    Skin turned red    Antimicrobials this admission: 6/24 Zosyn x 1  6/24 Meropenem >>    Dose adjustments this admission:    Microbiology results: 6/24 BCx: sent 6/24 UCx: sent   Thank you for allowing pharmacy to be a part of this patient's care.  Maryellen PilePoindexter, Greogry Goodwyn Trefz, PharmD 05/25/2018 8:42 PM

## 2018-05-25 NOTE — H&P (Addendum)
TRH H&P   Patient Demographics:    [name] [name], is a 42 y.o. female  MRN: [id number]   DOB - [birth year]  Admit Date - 05/25/2018  Outpatient Primary MD for the patient is Lacinda Axon  Referring MD/NP/PA:  Bruce Donath  Outpatient Specialists:   Patient coming from:  [facility]  Chief Complaint  Patient presents with  . Headache      HPI:    [name] [name]  is a 42 y.o. female, w cerebral palsy, hypertension, recurrent PE, h/o ESBL uti, who presents with fever, and was found to have UTI.  Ros: + slight frontal headache x3 days (pt admits to prior similar headaches), and dry cough.   In ED,  CT brain  IMPRESSION: No acute intracranial pathology.  CXR IMPRESSION: Shallow lung inflation without focal airspace disease.  Wbc 8.7, Hgb 12.9, Plt 327 Na 140, K 4.3, Bun 12, Creatinine 0.52 Ast 17, Alt 16  INR 1.68  Lactic acid 1.09  Pt will be admitted for UTI    Review of systems:    In addition to the HPI above,   No Headache, No changes with Vision or hearing, No problems swallowing food or Liquids, No Chest pain, No Shortness of Breath, No Abdominal pain, No Nausea or Vommitting, Bowel movements are regular, No Blood in stool or Urine, No dysuria, No new skin rashes or bruises, No new joints pains-aches,  No new weakness, tingling, numbness in any extremity, No recent weight gain or loss, No polyuria, polydypsia or polyphagia, No significant Mental Stressors.  A full 10 point Review of Systems was done, except as stated above, all other Review of Systems were negative.   With Past History of the following :    Past Medical History:  Diagnosis Date  . Abdominal pain 01/02/2012   Overview:  Overview:  Per Previous pcp note- Dr. Ta--Pt has history of endometriosis and had DUB in 2011.  She She had u/s that showed normal endometrial stripe.  She had  endometrial bx that was wnl.  She has a lot of abd cramping every month.  This cramping was sometimes not relieved with hydrocodone.  She was referred to [facility] for IUD placement to help endometriosis and abd cramping.  [name]  . Abdominal pain 01/02/2012   Overview:  Overview:  Per Previous pcp note- Dr. Ta--Pt has history of endometriosis and had DUB in 2011.  She She had u/s that showed normal endometrial stripe.  She had endometrial bx that was wnl.  She has a lot of abd cramping every month.  This cramping was sometimes not relieved with hydrocodone.  She was referred to [facility] for IUD placement to help endometriosis and abd cramping.  [name] was placed 04/30/11 by [facility].  Pt still complains of abd cramping, which I am treating with hydrocodone and ultram. Pt reports some initial relief of pain from IUD,  but states it has now returned back to similar to previous cramping. Gave patient a shot of Toradol 60 mg IM today-since positive exacerbation of abdominal cramping during past 2 weeks. Physical exam reassuring. Patient may need to return to [facility] for further workup since she is plugged in with the GYN providers there for further treatment options regarding endometriosis and abdominal cramping. The dysfunctional uterine bleeding now well contr  . Acute cystitis with hematuria   . Acute cystitis without hematuria   . Acute respiratory failure with hypoxia ([name])   . Anemia   . Cellulitis 12/22/2014  . Cerebral palsy ([name])    Spastic Cerebral palsy, mentally intact  . Contracture, joint, multiple sites    Electric wheelchair, uses left hand to operate chair.   . Depression   . Disease of female genital organs 10/24/2010   Overview:  Overview:  Pt has history of endometriosis and had DUB in 2011.  She had u/s that showed normal endometrial stripe.  She had endometrial bx that was wnl.  She has a lot of abd cramping every month.  This cramping was sometimes not relieved with hydrocodone.   She was referred to [facility] for IUD placement to help endometriosis and abd cramping.  [name] was placed 04/30/11 by [facility].  Pt still complains of abd cramping, which was treated with hydrocodone and ultram.   Last seen at [facility] by OB/GYN 12/24/11, given Depo Lupron injection.  Ordering CT scan to r/o additional cause of abdominal pain (pt with known endometriosis and will not tolerate vaginal U/S, pain not improved s/p [name] or with Lupron injection-- only caused hot flashes and amenorrhea, no pain relief).  Also made referral to pain clinic for better pain control.  Do not think hysterectomy would help with pain, and concern for contamination of baclofen pump if did proceed with surgery.    8/5/13[name]: Pt was seen by   . [name]   . Dysarthria   . Dyspnea and respiratory abnormality 02/04/2011   Overview:  Overview:  Pt has history of recurrent PE and is on chronic coumadin.  Pt has been complaining of dyspnea that she describes as chest tightness.  She has been to the ER multiple times for this.  Usually she gets CTA and serial CE.  She has had at least 6 CTA in a period of since 2011.  She also has an O2 requirement. It is unclear why pt feels dyspneic, complains of chest tightness, or has O2 requirement.  I thought that her complaint may be from deconditioning and referred her to PT.  Unfortunately Medicaid only pays for 4 PT visits.  Pt should be doing these exercises at home, but she has not.  I have referred her to [facility], and she has been seen by [name].  He did not understand the etiology of her dyspnea and O2 requirement.  He will continue to follow her.   Last Assessment & Plan:  Pt presents with concerns for chest tightness and dyspnea.  She was seen in ED yesterday and discharged home.  While there she had a CT chest 04/20/11 showed 1.  Multifocal degradation as detailed above.  N  . Endometriosis   . GERD (gastroesophageal reflux disease)   . Gram  positive sepsis ([name]) 10/24/2014  . Gross hematuria 03/12/2013   Overview:  Overview:  status post replacement of suprapubic tube 03/04/2013  Last Assessment & Plan:  Recent urine culture collected in ED on  5/4 did not result in significant growth. Moreover, patient has been asymptomatic with normal WBC, no fever and no true symptoms making symptomatic UTI less likely.  Will continue to monitor for any sign of infection.  Patient's xarelto had been held during her recent hospitalization due to hematuria. This has since been re-started. Monitor bleeding.    [name] Kitchen HCAP (healthcare-associated pneumonia) 10/25/2014  . HCAP (healthcare-associated pneumonia) 11/24/2017  . History of anticoagulant therapy 10/21/2012  . History of endometriosis 10/2012   OBGYN [facility]: Laparoscopic Endometrial Ablation, TVH,  . History of recurrent UTIs   . Hypertension   . HYPERTENSION, BENIGN 01/31/2010   Qualifier: Diagnosis of  By: [name], MD, [name] Incontinent of feces 03/11/2016  . Infantile cerebral palsy ([name]) 01/29/2007   Overview:  She has spastic CP.  She is mentally intact.  She is wheelchair bound.  She is wheelchair bound for ambulation      . Intracervical pessary 05/07/2011   Overview:  Overview:  Placed by [facility]YN 04/30/11 to treat DUB and Endometriosis.  She is seen at GYN clinic at [facility] but was considered a poor surgical candidate and referred her to [facility].  For DUB she had pelvic ultrasound that showed thin stripe and she had endometrial Bx by Dr Jennette Kettle in GYN clinic, which was negative.     . Macroscopic hematuria 03/10/2013   status post replacement of suprapubic tube 03/04/2013   . Migraine   . OCD (obsessive compulsive disorder)   . Parapneumonic effusion 10/25/2014  . Presence of intrathecal baclofen pump 03/07/2014   R LQ. Insertion T10.    [name] Kitchen Psychiatric illness 10/31/2014  . Pulmonary embolism ([name])    Lifetime Coumadin  . Pulmonary embolism ([name]) 2011, 01/2011   Will be on lifetime  coumadin  . Recurrent pulmonary embolism ([name]) 05/03/2011   Pt has history of recurrent PE and is on chronic coumadin.  Pt has been complaining of dyspnea that she describes as chest tightness.  She has been to the ER multiple times for this.  Usually she gets CTA and serial CE.  She has had at least 6 CTA in a period of since 2011.  She also has an O2 requirement. It is unclear why pt feels dyspneic, complains of chest tightness, or has O2 requirement.  I thought that her complaint may be from deconditioning and referred her to PT.  Unfortunately Medicaid only pays for 4 PT visits.  Pt should be doing these exercises at home, but she has not.  I have referred her to [facility], and she has been seen by [name].  He did not understand the etiology of her dyspnea and O2 requirement.  He will continue to follow her.     . Seborrheic keratosis 01/18/2011  . Seizures ([name]) 02/12/2016  . Spasticity 01/05/2014  . Transient alteration of awareness, recurrent 09/08/2006   Recurrent episodes where Iyanah  loses contact with her world and that have been studied thoroughly and appear nonepileptic in nature per Dr Terrace Arabia (neurology) The patient has had episodes starting in 2007 that initially began 1-4 times a day during which time she would have periods of unawareness followed by confusion. This continuous video EEG monitoring performed from October 8-12, 2007. Had   . Urethra dilated and patulous 07/03/2015   Patulous, Dilated Urethra. 5mL balloon on a 22-Fr catheter hoping this would prevent the bladder spasm from pushing the balloon down the urethra (Dr Logan Bores, Urology [facility], July 2016)  Past Surgical History:  Procedure Laterality Date  . ABDOMINAL HYSTERECTOMY    . APPENDECTOMY    . BACLOFEN PUMP REFILL     x 3 times  . CARPAL TUNNEL RELEASE  08/2008   Dr Teressa Senter  . CESAREAN SECTION     x 2  . CHOLECYSTECTOMY  10/14/2015   Procedure: LAPAROSCOPIC CHOLECYSTECTOMY;  Surgeon: Violeta Gelinas, MD;  Location: [facility]  OR;  Service: General;;  . COLPOSCOPY  06/2000  . INTRAUTERINE DEVICE INSERTION  04/30/11   Inserted by [facility] for endometriosis  . LAPAROSCOPIC ASSISTED VAGINAL HYSTERECTOMY  10/27/2012  . MULTIPLE EXTRACTIONS WITH ALVEOLOPLASTY Bilateral 07/19/2013   Procedure: EXTRACTIONS #4, 1,61,09,60;  Surgeon: Francene Finders, DDS;  Location: [facility] OR;  Service: Oral Surgery;  Laterality: Bilateral;  . NEUROMA SURGERY Left    anterior and posterior intersosseous  . PAIN PUMP IMPLANTATION N/A 03/07/2014   Procedure: baclofen pump revision/replacement and Catheter connection replacement;  Surgeon: Cristi Loron, MD;  Location: [facility] OR[facility];  Service: Neurosurgery;  Laterality: N/A;  baclofen pump revision/replacement and Catheter connection replacement  . PROGRAMABLE BACLOFEN PUMP REVISION  03/17/14   Battery Replacement   . TUBAL LIGATION  2003  . URETHRA SURGERY    . WRIST SURGERY  06/2010   Dr Dierdre Searles, hand surgeon, [name]      Social History:     Social History   Tobacco Use  . Smoking status: Never Smoker  . Smokeless tobacco: Never Used  Substance Use Topics  . Alcohol use: Yes    Alcohol/week: 0.6 oz    Types: 1 Glasses of wine per week    Comment: Drinks alcohol once a month.     Lives - at [facility] - unable to walk   Family History :     Family History  Problem Relation Age of Onset  . Asthma Father   . Colon cancer Maternal Grandmother        Died in her 24's  . Breast cancer Paternal Grandmother        Died in her 74's  . Heart attack Maternal Grandfather        Died in his 70's  . Alzheimer's disease Paternal Grandfather        Died in his 78's  . Breast cancer Mother   . Stomach cancer Neg Hx        Home Medications:   Prior to Admission medications   Medication Sig Start Date End Date Taking? Authorizing Provider  acetaminophen (TYLENOL) 325 MG tablet Take 650 mg by mouth 3 (three) times daily as needed (For pain.). Do not exceed 4 grams  in 24 hours.   Yes [provider]  acetaminophen (TYLENOL) 500 MG tablet Take 500-650 mg by mouth See admin instructions. 500mg  three times daily and 650 three times daily as needed for pain (do not exceed 4000mg  in 24hrs)   Yes [provider]  amantadine (SYMMETREL) 100 MG capsule Take 100 mg by mouth daily.    Yes [provider]  antiseptic oral rinse (BIOTENE) LIQD 15 mLs by Mouth Rinse route as needed for dry mouth.   Yes [provider]  baclofen (LIORESAL) 20 MG tablet Take 20 mg by mouth 4 (four) times daily. For bladder spasms   Yes [provider]  bisacodyl (DULCOLAX) 10 MG suppository Place 10 mg rectally as needed (For constipation.).   Yes [provider]  Buprenorphine HCl (BELBUCA) 150 MCG FILM Place 150 mcg  inside cheek every 12 (twelve) hours.   Yes [provider]  busPIRone (BUSPAR) 5 MG tablet Take 5 mg by mouth 3 (three) times daily.   Yes [provider]  clonazePAM (KLONOPIN) 0.5 MG tablet Take 1.5 tablets (0.75 mg total) by mouth 3 (three) times daily. 11/27/17  Yes Narda Bonds, MD  ferrous sulfate 325 (65 FE) MG tablet Take 325 mg by mouth daily.    Yes [provider]  FLUoxetine (PROZAC) 40 MG capsule Take 80 mg by mouth daily.   Yes [provider]  lactulose, encephalopathy, (ENULOSE) 10 GM/15ML SOLN Take 20 g by mouth daily as needed (For constipation.).   Yes [provider]  lamoTRIgine (LAMICTAL) 100 MG tablet Take 100 mg by mouth at bedtime.   Yes [provider]  lubiprostone (AMITIZA) 24 MCG capsule Take 1 capsule (24 mcg total) by mouth 2 (two) times daily with a meal. 01/01/17  Yes Esterwood, [name] S, PA-C  Melatonin 5 MG TABS Take 5 mg by mouth at bedtime.    Yes [provider]  Multiple Vitamins-Minerals (CERTA-VITE PO) Take 1 tablet by mouth daily.   Yes [provider]  ondansetron (ZOFRAN) 4 MG tablet Take 4 mg by mouth every 6  (six) hours.   Yes [provider]  OVER THE COUNTER MEDICATION Take 1 Package by mouth 3 (three) times daily with meals. Magic cup    Yes [provider]  oxybutynin (DITROPAN) 5 MG tablet Take 5 mg by mouth 4 (four) times daily.  02/20/16  Yes [provider]  oxyCODONE (OXY IR/ROXICODONE) 5 MG immediate release tablet Take 5 mg by mouth every 4 (four) hours as needed for severe pain (For pain.).   Yes [provider]  polyethylene glycol powder (GLYCOLAX/MIRALAX) powder Take 1  1/2 dose  daily in 12 ounces of fluid every day. Patient taking differently: Take 17 g by mouth 2 (two) times daily. Mix in 8 ounces of liquid. 01/01/17  Yes Esterwood, [name] S, PA-C  QUEtiapine (SEROQUEL) 50 MG tablet Take 150 mg by mouth at bedtime.   Yes [provider]  tiZANidine (ZANAFLEX) 4 MG capsule Take 4 mg by mouth 2 (two) times daily.    Yes [provider]  warfarin (COUMADIN) 6 MG tablet Take 9 mg by mouth every evening.    Yes [provider]     Allergies:     Allergies  Allergen Reactions  . Morphine Dermatitis and Other (See Comments)    Skin turned red     Physical Exam:   Vitals  Blood pressure (!) 108/95, pulse 87, temperature 99.5 F (37.5 C), temperature source Rectal, resp. rate (!) 21, height 4\' 10"  (1.473 m), weight 48.5 kg (107 lb), last menstrual period 04/05/2011, SpO2 95 %.   1. General  lying in bed in NAD,    2. Normal affect and insight, Not Suicidal or Homicidal, Awake Alert, Oriented X 3.  3. No F.N deficits, ALL C.Nerves Intact, Strength 5/5 all 4 extremities, Sensation intact all 4 extremities, Plantars down going.  4. Ears and Eyes appear Normal, Conjunctivae clear, PERRLA. Moist Oral Mucosa.  5. Supple Neck, No JVD, No cervical lymphadenopathy appriciated, No Carotid Bruits.  6. Symmetrical Chest wall movement, Good air movement bilaterally, CTAB.  7. RRR, No Gallops, Rubs or Murmurs, No Parasternal  Heave.  8. Positive Bowel Sounds, Abdomen Soft, No tenderness, No organomegaly appriciated,No rebound -guarding or rigidity.  9.  No Cyanosis, Normal  Skin Turgor, No Skin Rash or Bruise.  10. Good muscle tone,  joints appear normal , no effusions, Normal ROM.  11. No Palpable Lymph Nodes in Neck or Axillae  No cva tenderness  Foley catheter in place, yellow clear urine    Data Review:    CBC Recent Labs  Lab 05/25/18 1622  WBC 8.7  HGB 12.9  HCT 39.8  PLT 327  MCV 90.5  MCH 29.3  MCHC 32.4  RDW 16.2*  LYMPHSABS 2.1  MONOABS 0.7  EOSABS 1.0*  BASOSABS 0.0   ------------------------------------------------------------------------------------------------------------------  Chemistries  Recent Labs  Lab 05/25/18 1622  NA 140  K 4.3  CL 103  CO2 29  GLUCOSE 93  BUN 12  CREATININE 0.52  CALCIUM 9.1  AST 17  ALT 16  ALKPHOS 89  BILITOT 0.4   ------------------------------------------------------------------------------------------------------------------ estimated creatinine clearance is 57.9 mL/min (by C-G formula based on SCr of 0.52 mg/dL). ------------------------------------------------------------------------------------------------------------------ No results for input(s): TSH, T4TOTAL, T3FREE, THYROIDAB in the last 72 hours.  Invalid input(s): FREET3  Coagulation profile Recent Labs  Lab 05/25/18 1622  INR 1.68   ------------------------------------------------------------------------------------------------------------------- No results for input(s): DDIMER in the last 72 hours. -------------------------------------------------------------------------------------------------------------------  Cardiac Enzymes No results for input(s): CKMB, TROPONINI, MYOGLOBIN in the last 168 hours.  Invalid input(s): CK ------------------------------------------------------------------------------------------------------------------ No results found for:  BNP   ---------------------------------------------------------------------------------------------------------------  Urinalysis    Component Value Date/Time   COLORURINE YELLOW 05/25/2018 1822   APPEARANCEUR CLEAR 05/25/2018 1822   LABSPEC 1.011 05/25/2018 1822   PHURINE 8.0 05/25/2018 1822   GLUCOSEU NEGATIVE 05/25/2018 1822   HGBUR NEGATIVE 05/25/2018 1822   HGBUR large 10/03/2010 1530   BILIRUBINUR NEGATIVE 05/25/2018 1822   BILIRUBINUR neg 03/15/2011 1719   KETONESUR NEGATIVE 05/25/2018 1822   PROTEINUR NEGATIVE 05/25/2018 1822   UROBILINOGEN 1.0 10/08/2015 2348   NITRITE POSITIVE (A) 05/25/2018 1822   LEUKOCYTESUR LARGE (A) 05/25/2018 1822   LEUKOCYTESUR mod 01/26/2014    ----------------------------------------------------------------------------------------------------------------   Imaging Results:    Ct Head Wo Contrast  Result Date: 05/25/2018 CLINICAL DATA:  Headache for 3 days. EXAM: CT HEAD WITHOUT CONTRAST TECHNIQUE: Contiguous axial images were obtained from the base of the skull through the vertex without intravenous contrast. COMPARISON:  07/17/2011 FINDINGS: Brain: Sylvian fissure is somewhat effaced with respect to that of the left. This is stable compared to the prior study. There is no definitive mass effect, midline shift, or acute hemorrhage. There is no evidence of hydrocephalus. Vascular: No hyperdense vessel or unexpected calcification. Skull: Cranium is intact. Sinuses/Orbits: Mastoid air cells are clear. Visualized paranasal sinuses are clear. Orbits are within normal limits. Other: Noncontributory. IMPRESSION: No acute intracranial pathology. Electronically Signed   By: Jolaine Click M.D.   On: 05/25/2018 18:18   Dg Chest Port 1 View  Result Date: 05/25/2018 CLINICAL DATA:  Headaches and fever.  Shortness of breath. EXAM: PORTABLE CHEST 1 VIEW COMPARISON:  Chest radiograph 11/24/2017 FINDINGS: Shallow lung inflation without focal airspace  consolidation. No pleural effusion. Distended bowel is again seen in left upper quadrant. IMPRESSION: Shallow lung inflation without focal airspace disease. Electronically Signed   By: Deatra Robinson M.D.   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 Cont belbuca 150 micrograms poq  day Cont oxycodone 5mg  po q4h prn  Cont Symmetrel 100mg  po qday Cont Baclofen 20mg  po qid  Constipation Cont lacutulose Cont Amitiza  Urinary incontinence Cont ditropan  Anxiety/ Depression Cont prozac 80mg  po qday  (please verify dose) Cont lamictal 100mg  po qhs Cont buspar 5mg  po tid Cont clonazepam  Recurrent PE Coumadin pharmacy to dose   DVT Prophylaxis Coumadin AM Labs Ordered, also please review Full Orders  Family Communication: Admission, patients condition and plan of care including tests being ordered have been discussed with the patient  who indicate understanding and agree with the plan and Code Status.  Code Status  FULL CODE  Likely DC to  home  Condition GUARDED   Consults called:  none  Admission status: observation  Time spent in minutes : 60   Pearson GrippeJames Suzi Hernan M.D on 05/25/2018 at 8:12 PM  Between 7am to 7pm - Pager - [phone]  . After 7pm go to [email or web address] - password [email or web address]  [facility] - Office  [phone]

## 2018-05-25 NOTE — ED Notes (Signed)
Bed: WA14 Expected date:  Expected time:  Means of arrival:  Comments: 

## 2018-05-25 NOTE — Progress Notes (Signed)
ANTICOAGULATION CONSULT NOTE - Initial Consult  Pharmacy Consult for Warfarin Indication: pulmonary embolus  Allergies  Allergen Reactions  . Morphine Dermatitis and Other (See Comments)    Skin turned red    Patient Measurements: Height: 4\' 10"  (147.3 cm) Weight: 107 lb (48.5 kg) IBW/kg (Calculated) : 40.9  Vital Signs: Temp: 99.5 F (37.5 C) (06/24 2007) Temp Source: Rectal (06/24 2007) BP: 108/95 (06/24 2000) Pulse Rate: 87 (06/24 2000)  Labs: Recent Labs    05/25/18 1622  HGB 12.9  HCT 39.8  PLT 327  LABPROT 19.7*  INR 1.68  CREATININE 0.52    Estimated Creatinine Clearance: 57.9 mL/min (by C-G formula based on SCr of 0.52 mg/dL).   Medical History: Past Medical History:  Diagnosis Date  . Abdominal pain 01/02/2012   Overview:  Overview:  Per Previous pcp note- Dr. Ta--Pt has history of endometriosis and had DUB in 2011.  She She had u/s that showed normal endometrial stripe.  She had endometrial bx that was wnl.  She has a lot of abd cramping every month.  This cramping was sometimes not relieved with hydrocodone.  She was referred to [facility] for IUD placement to help endometriosis and abd cramping.  [name]  . Abdominal pain 01/02/2012   Overview:  Overview:  Per Previous pcp note- Dr. Ta--Pt has history of endometriosis and had DUB in 2011.  She She had u/s that showed normal endometrial stripe.  She had endometrial bx that was wnl.  She has a lot of abd cramping every month.  This cramping was sometimes not relieved with hydrocodone.  She was referred to [facility] for IUD placement to help endometriosis and abd cramping.  [name] was placed 04/30/11 by [facility].  Pt still complains of abd cramping, which I am treating with hydrocodone and ultram. Pt reports some initial relief of pain from IUD, but states it has now returned back to similar to previous cramping. Gave patient a shot of Toradol 60 mg IM today-since positive exacerbation of abdominal cramping during  past 2 weeks. Physical exam reassuring. Patient may need to return to [facility] for further workup since she is plugged in with the GYN providers there for further treatment options regarding endometriosis and abdominal cramping. The dysfunctional uterine bleeding now well contr  . Acute cystitis with hematuria   . Acute cystitis without hematuria   . Acute respiratory failure with hypoxia ([name])   . Anemia   . Cellulitis 12/22/2014  . Cerebral palsy ([name])    Spastic Cerebral palsy, mentally intact  . Contracture, joint, multiple sites    Electric wheelchair, uses left hand to operate chair.   . Depression   . Disease of female genital organs 10/24/2010   Overview:  Overview:  Pt has history of endometriosis and had DUB in 2011.  She had u/s that showed normal endometrial stripe.  She had endometrial bx that was wnl.  She has a lot of abd cramping every month.  This cramping was sometimes not relieved with hydrocodone.  She was referred to [facility] for IUD placement to help endometriosis and abd cramping.  [name] was placed 04/30/11 by [facility].  Pt still complains of abd cramping, which was treated with hydrocodone and ultram.   Last seen at [facility] by OB/GYN 12/24/11, given Depo Lupron injection.  Ordering CT scan to r/o additional cause of abdominal pain (pt with known endometriosis and will not tolerate vaginal U/S, pain not improved s/p [name] or with Lupron injection-- only  caused hot flashes and amenorrhea, no pain relief).  Also made referral to pain clinic for better pain control.  Do not think hysterectomy would help with pain, and concern for contamination of baclofen pump if did proceed with surgery.    8/5/13[name]: [facility]: Pt was seen by   . [name]   . Dysarthria   . Dyspnea and respiratory abnormality 02/04/2011   Overview:  Overview:  Pt has history of recurrent PE and is on chronic coumadin.  Pt has been complaining of dyspnea that she describes as chest  tightness.  She has been to the ER multiple times for this.  Usually she gets CTA and serial CE.  She has had at least 6 CTA in a period of since 2011.  She also has an O2 requirement. It is unclear why pt feels dyspneic, complains of chest tightness, or has O2 requirement.  I thought that her complaint may be from deconditioning and referred her to PT.  Unfortunately Medicaid only pays for 4 PT visits.  Pt should be doing these exercises at home, but she has not.  [facility] and she has been seen by Dr Marchelle Gearingamaswamy.  He did not understand the etiology of her dyspnea and O2 requirement.  He will continue to follow her.   Last Assessment & Plan:  Pt presents with concerns for chest tightness and dyspnea.  She was seen in ED yesterday and discharged home.  While there she had a CT chest 04/20/11 showed 1.  Multifocal degradation as detailed above.  N  . Endometriosis   . GERD (gastroesophageal reflux disease)   . Gram positive sepsis ([name]) 10/24/2014  . Gross hematuria 03/12/2013   Overview:  Overview:  status post replacement of suprapubic tube 03/04/2013  Last Assessment & Plan:  Recent urine culture collected in ED on 5/4 did not result in significant growth. Moreover, patient has been asymptomatic with normal WBC, no fever and no true symptoms making symptomatic UTI less likely.  Will continue to monitor for any sign of infection.  Patient's xarelto had been held during her recent hospitalization due to hematuria. This has since been re-started. Monitor bleeding.    [name] Kitchen. HCAP (healthcare-associated pneumonia) 10/25/2014  . HCAP (healthcare-associated pneumonia) 11/24/2017  . History of anticoagulant therapy 10/21/2012  . History of endometriosis 10/2012   OBGYN [facility]: Laparoscopic Endometrial Ablation, TVH,  . History of recurrent UTIs   . Hypertension   . HYPERTENSION, BENIGN 01/31/2010   Qualifier: Diagnosis of  By: Gala RomneyBensimhon, MD, Trixie DredgeFACC, Daniel R   . Incontinent of feces 03/11/2016  . Infantile  cerebral palsy ([name]) 01/29/2007   Overview:  She has spastic CP.  She is mentally intact.  She is wheelchair bound.  She is wheelchair bound for ambulation      . Intracervical pessary 05/07/2011   Overview:  Overview:  [facility] to treat DUB and Endometriosis.  [facility] but was considered a poor surgical candidate and [facility]  For DUB she had pelvic ultrasound that showed thin stripe and she had endometrial Bx by Dr Jennette KettleNeal in GYN clinic, which was negative.     . Macroscopic hematuria 03/10/2013   status post replacement of suprapubic tube 03/04/2013   . Migraine   . OCD (obsessive compulsive disorder)   . Parapneumonic effusion 10/25/2014  . Presence of intrathecal baclofen pump 03/07/2014   R LQ. Insertion  T10.    . Psychiatric illness 10/31/2014  . Pulmonary embolism ([name])    Lifetime Coumadin  . Pulmonary embolism ([name]) 2011, 01/2011   Will be on lifetime coumadin  . Recurrent pulmonary embolism ([name]) 05/03/2011   Pt has history of recurrent PE and is on chronic coumadin.  Pt has been complaining of dyspnea that she describes as chest tightness.  She has been to the ER multiple times for this.  Usually she gets CTA and serial CE.  She has had at least 6 CTA in a period of since 2011.  She also has an O2 requirement. It is unclear why pt feels dyspneic, complains of chest tightness, or has O2 requirement.  I thought that her complaint may be from deconditioning and referred her to PT.  Unfortunately Medicaid only pays for 4 PT visits.  Pt should be doing these exercises at home, but she has not.  I have referred her to [facility], and she has been seen by [name].  He did not understand the etiology of her dyspnea and O2 requirement.  He will continue to follow her.     . Seborrheic keratosis 01/18/2011  . Seizures ([name]) 02/12/2016  . Spasticity 01/05/2014  . Transient alteration of awareness, recurrent 09/08/2006   Recurrent episodes where  Tashema  loses contact with her world and that have been studied thoroughly and appear nonepileptic in nature per Dr Terrace Arabia (neurology) The patient has had episodes starting in 2007 that initially began 1-4 times a day during which time she would have periods of unawareness followed by confusion. This continuous video EEG monitoring performed from October 8-12, 2007. Had   . Urethra dilated and patulous 07/03/2015   Patulous, Dilated Urethra. 5mL balloon on a 22-Fr catheter hoping this would prevent the bladder spasm from pushing the balloon down the urethra (Dr Logan Bores, Urology [facility], July 2016)     Medications:  PTA: Warfarin    Assessment:  42 yr female presents to ED with c/o headache  PMH signficant for cerebral palsy, HTN, migraine, seizures, and recurrent PE  Pharmacy consulted to dose warfarin  PTA patient on warfarin 9mg  po daily with last dose documented on nursing [facility] as 6/23 @ 1700  INR upon admission = 1.68  Goal of Therapy:  INR 2-3   Plan:  Warfarin 9mg  po x 1 dose tonight Daily PT/INR  Maryellen Pile, PharmD 05/25/2018,8:32 PM

## 2018-05-25 NOTE — ED Notes (Signed)
ED TO INPATIENT HANDOFF REPORT  Name/Age/Gender Lavone Orn 42 y.o. female  Code Status Code Status History    Date Active Date Inactive Code Status Order ID Comments User Context   11/24/2017 0313 11/27/2017 1910 Full Code [id number]  Vianne Bulls, MD ED   10/09/2015 0216 10/30/2015 2103 Full Code [id number]  Minor, Grace Bushy, NP ED   06/09/2015 0249 06/14/2015 1958 Full Code [id number]  Lorna Few, DO Inpatient   10/22/2014 1709 10/28/2014 1556 Full Code [id number]  Frazier Richards, MD Inpatient   03/10/2014 2127 03/15/2014 1834 Full Code [id number]  Leeanne Rio, MD Inpatient   03/07/2014 1521 03/08/2014 2000 Full Code [id number]  Newman Pies, MD Inpatient      Home/SNF/Other Nursing Home  Chief Complaint head pain   Level of Care/Admitting Diagnosis ED Disposition    None      Medical History Past Medical History:  Diagnosis Date  . Abdominal pain 01/02/2012   Overview:  Overview:  Per Previous pcp note- Dr. Ta--Pt has history of endometriosis and had DUB in 2011.  She She had u/s that showed normal endometrial stripe.  She had endometrial bx that was wnl.  She has a lot of abd cramping every month.  This cramping was sometimes not relieved with hydrocodone.  She was referred to [facility] for IUD placement to help endometriosis and abd cramping.  [name]  . Abdominal pain 01/02/2012   Overview:  Overview:  Per Previous pcp note- Dr. Ta--Pt has history of endometriosis and had DUB in 2011.  She She had u/s that showed normal endometrial stripe.  She had endometrial bx that was wnl.  She has a lot of abd cramping every month.  This cramping was sometimes not relieved with hydrocodone.  She was referred to [facility] for IUD placement to help endometriosis and abd cramping.  [name] was placed 04/30/11 by [facility].  Pt still complains of abd cramping, which I am treating with hydrocodone and ultram. Pt reports some initial relief of pain from IUD, but states it has now  returned back to similar to previous cramping. Gave patient a shot of Toradol 60 mg IM today-since positive exacerbation of abdominal cramping during past 2 weeks. Physical exam reassuring. Patient may need to return to [place] for further workup since she is plugged in with the GYN providers there for further treatment options regarding endometriosis and abdominal cramping. The dysfunctional uterine bleeding now well contr  . Acute cystitis with hematuria   . Acute cystitis without hematuria   . Acute respiratory failure with hypoxia ([place])   . Anemia   . Cellulitis 12/22/2014  . Cerebral palsy ([name])    Spastic Cerebral palsy, mentally intact  . Contracture, joint, multiple sites    Electric wheelchair, uses left hand to operate chair.   . Depression   . Disease of female genital organs 10/24/2010   Overview:  Overview:  Pt has history of endometriosis and had DUB in 2011.  She had u/s that showed normal endometrial stripe.  She had endometrial bx that was wnl.  She has a lot of abd cramping every month.  This cramping was sometimes not relieved with hydrocodone.  She was referred to [facility] for IUD placement to help endometriosis and abd cramping.  [name] was placed 04/30/11 by [facility].  Pt still complains of abd cramping, which was treated with hydrocodone and ultram.   Last seen at [facility] by OB/GYN 12/24/11, given Depo Lupron injection.  Ordering  CT scan to r/o additional cause of abdominal pain (pt with known endometriosis and will not tolerate vaginal U/S, pain not improved s/p [name] or with Lupron injection-- only caused hot flashes and amenorrhea, no pain relief).  Also made referral to pain clinic for better pain control.  Do not think hysterectomy would help with pain, and concern for contamination of baclofen pump if did proceed with surgery.    8/5/13[name]: Pt was seen by   . [name]   . Dysarthria   . Dyspnea and respiratory abnormality 02/04/2011    Overview:  Overview:  Pt has history of recurrent PE and is on chronic coumadin.  Pt has been complaining of dyspnea that she describes as chest tightness.  She has been to the ER multiple times for this.  Usually she gets CTA and serial CE.  She has had at least 6 CTA in a period of since 2011.  She also has an O2 requirement. It is unclear why pt feels dyspneic, complains of chest tightness, or has O2 requirement.  I thought that her complaint may be from deconditioning and referred her to PT.  Unfortunately Medicaid only pays for 4 PT visits.  Pt should be doing these exercises at home, but she has not.  I have referred her to [facility], and she has been seen by Dr Chase Caller.  He did not understand the etiology of her dyspnea and O2 requirement.  He will continue to follow her.   Last Assessment & Plan:  Pt presents with concerns for chest tightness and dyspnea.  She was seen in ED yesterday and discharged home.  While there she had a CT chest 04/20/11 showed 1.  Multifocal degradation as detailed above.  N  . Endometriosis   . GERD (gastroesophageal reflux disease)   . Gram positive sepsis ([place]) 10/24/2014  . Gross hematuria 03/12/2013   Overview:  Overview:  status post replacement of suprapubic tube 03/04/2013  Last Assessment & Plan:  Recent urine culture collected in ED on 5/4 did not result in significant growth. Moreover, patient has been asymptomatic with normal WBC, no fever and no true symptoms making symptomatic UTI less likely.  Will continue to monitor for any sign of infection.  Patient's xarelto had been held during her recent hospitalization due to hematuria. This has since been re-started. Monitor bleeding.    [name] Kitchen HCAP (healthcare-associated pneumonia) 10/25/2014  . HCAP (healthcare-associated pneumonia) 11/24/2017  . History of anticoagulant therapy 10/21/2012  . History of endometriosis 10/2012   OBGYN [facility]: Laparoscopic Endometrial Ablation, TVH,  . History of recurrent UTIs   .  Hypertension   . HYPERTENSION, BENIGN 01/31/2010   Qualifier: Diagnosis of  By: [name], MD, [name] Incontinent of feces 03/11/2016  . Infantile cerebral palsy ([place]) 01/29/2007   Overview:  She has spastic CP.  She is mentally intact.  She is wheelchair bound.  She is wheelchair bound for ambulation      . Intracervical pessary 05/07/2011   Overview:  Overview:  Placed by [facility]YN 04/30/11 to treat DUB and Endometriosis.  She is seen at GYN clinic at [facility] but was considered a poor surgical candidate and referred her to [facility].  For DUB she had pelvic ultrasound that showed thin stripe and she had endometrial Bx by Dr Nori Riis in [place] clinic, which was negative.     . Macroscopic hematuria 03/10/2013   status post replacement of suprapubic tube 03/04/2013   .  Migraine   . OCD (obsessive compulsive disorder)   . Parapneumonic effusion 10/25/2014  . Presence of intrathecal baclofen pump 03/07/2014   R LQ. Insertion T10.    [name] Kitchen Psychiatric illness 10/31/2014  . Pulmonary embolism ([name])    Lifetime Coumadin  . Pulmonary embolism ([place]) 2011, 01/2011   Will be on lifetime coumadin  . Recurrent pulmonary embolism ([place]) 05/03/2011   Pt has history of recurrent PE and is on chronic coumadin.  Pt has been complaining of dyspnea that she describes as chest tightness.  She has been to the ER multiple times for this.  Usually she gets CTA and serial CE.  She has had at least 6 CTA in a period of since 2011.  She also has an O2 requirement. It is unclear why pt feels dyspneic, complains of chest tightness, or has O2 requirement.  I thought that her complaint may be from deconditioning and referred her to PT.  Unfortunately Medicaid only pays for 4 PT visits.  Pt should be doing these exercises at home, but she has not.  I have referred her to [facility], and she has been seen by Dr Chase Caller.  He did not understand the etiology of her dyspnea and O2 requirement.  He will continue to follow her.     . Seborrheic  keratosis 01/18/2011  . Seizures ([place]) 02/12/2016  . Spasticity 01/05/2014  . Transient alteration of awareness, recurrent 09/08/2006   Recurrent episodes where Nomi  loses contact with her world and that have been studied thoroughly and appear nonepileptic in nature per Dr Krista Blue (neurology) The patient has had episodes starting in 2007 that initially began 1-4 times a day during which time she would have periods of unawareness followed by confusion. This continuous video EEG monitoring performed from October 8-12, 2007. Had   . Urethra dilated and patulous 07/03/2015   Patulous, Dilated Urethra. 57m balloon on a 22-Fr catheter hoping this would prevent the bladder spasm from pushing the balloon down the urethra (Dr EAmalia Hailey [facility], July 2016)     Allergies Allergies  Allergen Reactions  . Morphine Dermatitis and Other (See Comments)    Skin turned red    IV Location/Drains/Wounds Patient Lines/Drains/Airways Status   Active Line/Drains/Airways    Name:   Placement date:   Placement time:   Site:   Days:   Peripheral IV 03/24/18 Left Antecubital   03/24/18    0700    Antecubital   62   Peripheral IV 05/25/18 Left Antecubital   05/25/18    1623    Antecubital   less than 1   Suprapubic Catheter 22 Fr.   03/24/18    0900    -   62   Incision (Closed) 10/14/15 Abdomen Bilateral   10/14/15    1435     954   Incision (Closed) 11/24/17 Cervical Anterior;Posterior   11/24/17    0533     182   Incision (Closed) 11/24/17 Cervical Right   11/24/17    0533     182   Incision (Closed) 11/24/17 Vertebral column Posterior;Upper;Mid   11/24/17    0533     182   Incision - 4 Ports Abdomen 1: Umbilicus 2: Mid;Upper 3: Right;Upper 4: Left;Upper   10/14/15    1434     954   Pressure Ulcer 03/14/14 Stage I -  Intact skin with non-blanchable redness of a localized area usually over a bony prominence. buttock redden    03/14/14  2200     1533   Pressure Ulcer 10/22/14 Stage II -  Partial thickness loss of  dermis presenting as a shallow open ulcer with a red, pink wound bed without slough. almost appears as a skin tear, no drainage   10/22/14    1728     1311   Pressure Ulcer 10/29/15 Stage II -  Partial thickness loss of dermis presenting as a shallow open ulcer with a red, pink wound bed without slough.   10/29/15    0915     939   Wound / Incision (Open or Dehisced) 11/24/17 Sacrum Medial Redness, with open sore   11/24/17    0533    Sacrum   182          Labs/Imaging Results for orders placed or performed during the hospital encounter of 05/25/18 (from the past 48 hour(s))  Comprehensive metabolic panel     Status: None   Collection Time: 05/25/18  4:22 PM  Result Value Ref Range   Sodium 140 135 - 145 mmol/L   Potassium 4.3 3.5 - 5.1 mmol/L   Chloride 103 101 - 111 mmol/L   CO2 29 22 - 32 mmol/L   Glucose, Bld 93 65 - 99 mg/dL   BUN 12 6 - 20 mg/dL   Creatinine, Ser 0.52 0.44 - 1.00 mg/dL   Calcium 9.1 8.9 - 10.3 mg/dL   Total Protein 7.9 6.5 - 8.1 g/dL   Albumin 4.1 3.5 - 5.0 g/dL   AST 17 15 - 41 U/L   ALT 16 14 - 54 U/L   Alkaline Phosphatase 89 38 - 126 U/L   Total Bilirubin 0.4 0.3 - 1.2 mg/dL   GFR calc non Af Amer >60 >60 mL/min   GFR calc Af Amer >60 >60 mL/min    Comment: (NOTE) The eGFR has been calculated using the CKD EPI equation. This calculation has not been validated in all clinical situations. eGFR's persistently <60 mL/min signify possible Chronic Kidney Disease.    Anion gap 8 5 - 15    Comment: Performed at [facility], [place] [address], [place], [place] [address]  CBC WITH DIFFERENTIAL     Status: Abnormal   Collection Time: 05/25/18  4:22 PM  Result Value Ref Range   WBC 8.7 4.0 - 10.5 K/uL   RBC 4.40 3.87 - 5.11 MIL/uL   Hemoglobin 12.9 12.0 - 15.0 g/dL   HCT 39.8 36.0 - 46.0 %   MCV 90.5 78.0 - 100.0 fL   MCH 29.3 26.0 - 34.0 pg   MCHC 32.4 30.0 - 36.0 g/dL   RDW 16.2 (H) 11.5 - 15.5 %   Platelets 327 150 - 400 K/uL    Neutrophils Relative % 57 %   Neutro Abs 5.0 1.7 - 7.7 K/uL   Lymphocytes Relative 24 %   Lymphs Abs 2.1 0.7 - 4.0 K/uL   Monocytes Relative 8 %   Monocytes Absolute 0.7 0.1 - 1.0 K/uL   Eosinophils Relative 11 %   Eosinophils Absolute 1.0 (H) 0.0 - 0.7 K/uL   Basophils Relative 0 %   Basophils Absolute 0.0 0.0 - 0.1 K/uL    Comment: Performed at [facility], [place] [address], [place], [place] [address]  Protime-INR     Status: Abnormal   Collection Time: 05/25/18  4:22 PM  Result Value Ref Range   Prothrombin Time 19.7 (H) 11.4 - 15.2 seconds   INR 1.68  Comment: Performed at [facility], [place] [address], [place], [place] [address]  I-Stat CG4 Lactic Acid, ED  (not at  [facility])     Status: None   Collection Time: 05/25/18  4:29 PM  Result Value Ref Range   Lactic Acid, Venous 1.09 0.5 - 1.9 mmol/L  Urinalysis, Routine w reflex microscopic     Status: Abnormal   Collection Time: 05/25/18  6:22 PM  Result Value Ref Range   Color, Urine YELLOW YELLOW   APPearance CLEAR CLEAR   Specific Gravity, Urine 1.011 1.005 - 1.030   pH 8.0 5.0 - 8.0   Glucose, UA NEGATIVE NEGATIVE mg/dL   Hgb urine dipstick NEGATIVE NEGATIVE   Bilirubin Urine NEGATIVE NEGATIVE   Ketones, ur NEGATIVE NEGATIVE mg/dL   Protein, ur NEGATIVE NEGATIVE mg/dL   Nitrite POSITIVE (A) NEGATIVE   Leukocytes, UA LARGE (A) NEGATIVE   RBC / HPF 0-5 0 - 5 RBC/hpf   WBC, UA 21-50 0 - 5 WBC/hpf   Bacteria, UA RARE (A) NONE SEEN   Squamous Epithelial / LPF 0-5 0 - 5    Comment: Performed at [facility], [place] [address], [place], [place] [address]   Ct Head Wo Contrast  Result Date: 05/25/2018 CLINICAL DATA:  Headache for 3 days. EXAM: CT HEAD WITHOUT CONTRAST TECHNIQUE: Contiguous axial images were obtained from the base of the skull through the vertex without intravenous contrast. COMPARISON:  07/17/2011 FINDINGS: Brain: Sylvian fissure is somewhat effaced with  respect to that of the left. This is stable compared to the prior study. There is no definitive mass effect, midline shift, or acute hemorrhage. There is no evidence of hydrocephalus. Vascular: No hyperdense vessel or unexpected calcification. Skull: Cranium is intact. Sinuses/Orbits: Mastoid air cells are clear. Visualized paranasal sinuses are clear. Orbits are within normal limits. Other: Noncontributory. IMPRESSION: No acute intracranial pathology. Electronically Signed   By: Marybelle Killings M.D.   On: 05/25/2018 18:18   Dg Chest Port 1 View  Result Date: 05/25/2018 CLINICAL DATA:  Headaches and fever.  Shortness of breath. EXAM: PORTABLE CHEST 1 VIEW COMPARISON:  Chest radiograph 11/24/2017 FINDINGS: Shallow lung inflation without focal airspace consolidation. No pleural effusion. Distended bowel is again seen in left upper quadrant. IMPRESSION: Shallow lung inflation without focal airspace disease. Electronically Signed   By: Ulyses Jarred M.D.   On: 05/25/2018 17:29    Pending Labs Unresulted Labs (From admission, onward)   Start     Ordered   05/25/18 1618  Blood Culture (routine x 2)  BLOOD CULTURE X 2,   STAT     05/25/18 1618   05/25/18 1618  Urine culture  STAT,   STAT     05/25/18 1618      Vitals/Pain Today's Vitals   05/25/18 1531 05/25/18 1730 05/25/18 1848 05/25/18 1850  BP: 128/84 105/75 122/81 116/74  Pulse: 81 79 80 100  Resp: '18 18 14 18  ' Temp:      TempSrc:      SpO2: 96% 96% 95% 96%  Weight:      Height:      PainSc:        Isolation Precautions No active isolations  Medications Medications  0.9 %  sodium chloride infusion (1,000 mLs Intravenous New Bag/Given 05/25/18 1637)  piperacillin-tazobactam (ZOSYN) IVPB 3.375 g (3.375 g Intravenous New Bag/Given 05/25/18 1954)  metoCLOPramide (REGLAN) injection 5 mg (5 mg Intravenous Given 05/25/18 1637)  diphenhydrAMINE (BENADRYL) injection 12.5 mg (12.5  mg Intravenous Given 05/25/18 1635)  acetaminophen (TYLENOL)  tablet 650 mg (650 mg Oral Given 05/25/18 1954)    Mobility non-ambulatory

## 2018-05-25 NOTE — ED Notes (Signed)
Urinary drainage bag changed in order to get new urine for sample

## 2018-05-25 NOTE — ED Provider Notes (Signed)
[facility]-EMERGENCY DEPT Provider Note   CSN: [id number] Arrival date & time: 05/25/18  1350     History   Chief Complaint Chief Complaint  Patient presents with  . Headache    HPI [name] is a 42 y.o. female.  42 year old female with history of cerebral palsy presents with headache x3 days.  Her headache has waxed and waned and is been frontal and occipital in nature.  She has not had any associated fever, neck pain, emesis.  Does take Coumadin.  Also has been complaining of chills and low-grade temperature with some nonproductive cough and congestion.  She also has an indwelling Foley catheter in the urine has changed in appearance.  Presents for further management     Past Medical History:  Diagnosis Date  . Abdominal pain 01/02/2012   Overview:  Overview:  Per Previous pcp note- Dr. Ta--Pt has history of endometriosis and had DUB in 2011.  She She had u/s that showed normal endometrial stripe.  She had endometrial bx that was wnl.  She has a lot of abd cramping every month.  This cramping was sometimes not relieved with hydrocodone.  She was referred to [facility] for IUD placement to help endometriosis and abd cramping.  [name]  . Abdominal pain 01/02/2012   Overview:  Overview:  Per Previous pcp note- Dr. Ta--Pt has history of endometriosis and had DUB in 2011.  She She had u/s that showed normal endometrial stripe.  She had endometrial bx that was wnl.  She has a lot of abd cramping every month.  This cramping was sometimes not relieved with hydrocodone.  She was referred to [facility] for IUD placement to help endometriosis and abd cramping.  [name] was placed 04/30/11 by [facility].  Pt still complains of abd cramping, which I am treating with hydrocodone and ultram. Pt reports some initial relief of pain from IUD, but states it has now returned back to similar to previous cramping. Gave patient a shot of Toradol 60 mg IM today-since positive exacerbation  of abdominal cramping during past 2 weeks. Physical exam reassuring. Patient may need to return to [facility] for further workup since she is plugged in with the GYN providers there for further treatment options regarding endometriosis and abdominal cramping. The dysfunctional uterine bleeding now well contr  . Acute cystitis with hematuria   . Acute cystitis without hematuria   . Acute respiratory failure with hypoxia ([name])   . Anemia   . Cellulitis 12/22/2014  . Cerebral palsy ([name])    Spastic Cerebral palsy, mentally intact  . Contracture, joint, multiple sites    Electric wheelchair, uses left hand to operate chair.   . Depression   . Disease of female genital organs 10/24/2010   Overview:  Overview:  Pt has history of endometriosis and had DUB in 2011.  She had u/s that showed normal endometrial stripe.  She had endometrial bx that was wnl.  She has a lot of abd cramping every month.  This cramping was sometimes not relieved with hydrocodone.  She was referred to [facility] for IUD placement to help endometriosis and abd cramping.  [name] was placed 04/30/11 by [facility].  Pt still complains of abd cramping, which was treated with hydrocodone and ultram.   Last seen at [facility] by OB/GYN 12/24/11, given Depo Lupron injection.  Ordering CT scan to r/o additional cause of abdominal pain (pt with known endometriosis and will not tolerate vaginal U/S, pain not improved s/p  [name] or with Lupron injection-- only caused hot flashes and amenorrhea, no pain relief).  Also made referral to pain clinic for better pain control.  Do not think hysterectomy would help with pain, and concern for contamination of baclofen pump if did proceed with surgery.    8/5/13[name]: [facility]: Pt was seen by   . [name]   . Dysarthria   . Dyspnea and respiratory abnormality 02/04/2011   Overview:  Overview:  Pt has history of recurrent PE and is on chronic coumadin.  Pt has been complaining of dyspnea  that she describes as chest tightness.  She has been to the ER multiple times for this.  Usually she gets CTA and serial CE.  She has had at least 6 CTA in a period of since 2011.  She also has an O2 requirement. It is unclear why pt feels dyspneic, complains of chest tightness, or has O2 requirement.  I thought that her complaint may be from deconditioning and referred her to PT.  Unfortunately Medicaid only pays for 4 PT visits.  Pt should be doing these exercises at home, but she has not.  [facility] and she has been seen by Dr Marchelle Gearingamaswamy.  He did not understand the etiology of her dyspnea and O2 requirement.  He will continue to follow her.   Last Assessment & Plan:  Pt presents with concerns for chest tightness and dyspnea.  She was seen in ED yesterday and discharged home.  While there she had a CT chest 04/20/11 showed 1.  Multifocal degradation as detailed above.  N  . Endometriosis   . GERD (gastroesophageal reflux disease)   . Gram positive sepsis ([name]) 10/24/2014  . Gross hematuria 03/12/2013   Overview:  Overview:  status post replacement of suprapubic tube 03/04/2013  Last Assessment & Plan:  Recent urine culture collected in ED on 5/4 did not result in significant growth. Moreover, patient has been asymptomatic with normal WBC, no fever and no true symptoms making symptomatic UTI less likely.  Will continue to monitor for any sign of infection.  Patient's xarelto had been held during her recent hospitalization due to hematuria. This has since been re-started. Monitor bleeding.    [name] Kitchen. HCAP (healthcare-associated pneumonia) 10/25/2014  . HCAP (healthcare-associated pneumonia) 11/24/2017  . History of anticoagulant therapy 10/21/2012  . History of endometriosis 10/2012   OBGYN [facility]: Laparoscopic Endometrial Ablation, TVH,  . History of recurrent UTIs   . Hypertension   . HYPERTENSION, BENIGN 01/31/2010   Qualifier: Diagnosis of  By: Gala RomneyBensimhon, MD, Trixie DredgeFACC, Daniel R   . Incontinent of  feces 03/11/2016  . Infantile cerebral palsy ([name]) 01/29/2007   Overview:  She has spastic CP.  She is mentally intact.  She is wheelchair bound.  She is wheelchair bound for ambulation      . Intracervical pessary 05/07/2011   Overview:  Overview:  [facility]11 to treat DUB and Endometriosis.  [facility] but was considered a poor surgical candidate and [facility]  For DUB she had pelvic ultrasound that showed thin stripe and she had endometrial Bx by Dr Jennette KettleNeal in GYN clinic, which was negative.     . Macroscopic hematuria 03/10/2013   status post replacement of suprapubic tube 03/04/2013   . Migraine   . OCD (obsessive compulsive disorder)   . Parapneumonic effusion 10/25/2014  . Presence of intrathecal baclofen pump  03/07/2014   R LQ. Insertion T10.    [name] Kitchen Psychiatric illness 10/31/2014  . Pulmonary embolism ([name])    Lifetime Coumadin  . Pulmonary embolism ([name]) 2011, 01/2011   Will be on lifetime coumadin  . Recurrent pulmonary embolism ([name]) 05/03/2011   Pt has history of recurrent PE and is on chronic coumadin.  Pt has been complaining of dyspnea that she describes as chest tightness.  She has been to the ER multiple times for this.  Usually she gets CTA and serial CE.  She has had at least 6 CTA in a period of since 2011.  She also has an O2 requirement. It is unclear why pt feels dyspneic, complains of chest tightness, or has O2 requirement.  I thought that her complaint may be from deconditioning and referred her to PT.  Unfortunately Medicaid only pays for 4 PT visits.  Pt should be doing these exercises at home, but she has not.  I have referred her to [facility], and she has been seen by [name].  He did not understand the etiology of her dyspnea and O2 requirement.  He will continue to follow her.     . Seborrheic keratosis 01/18/2011  . Seizures ([name]) 02/12/2016  . Spasticity 01/05/2014  . Transient alteration of awareness, recurrent 09/08/2006    Recurrent episodes where Latrelle  loses contact with her world and that have been studied thoroughly and appear nonepileptic in nature per Dr Terrace Arabia (neurology) The patient has had episodes starting in 2007 that initially began 1-4 times a day during which time she would have periods of unawareness followed by confusion. This continuous video EEG monitoring performed from October 8-12, 2007. Had   . Urethra dilated and patulous 07/03/2015   Patulous, Dilated Urethra. 5mL balloon on a 22-Fr catheter hoping this would prevent the bladder spasm from pushing the balloon down the urethra (Dr Logan Bores, Urology [facility], July 2016)     Patient Active Problem List   Diagnosis Date Noted  . Incontinent of feces 03/11/2016  . Chronic anticoagulation 03/11/2016  . Involuntary movements 02/22/2016  . Seizures ([name]) 02/12/2016  . Dyspnea 02/12/2016  . Cerebral palsy, quadriplegic ([name]) 02/06/2016  . Swelling of extremity 01/13/2016  . Pressure ulcer 10/09/2015  . Status post insertion of inferior vena caval filter 09/28/2015  . Absence of bladder continence 08/25/2015  . Person living in residential institution 06/14/2015  . Pulmonary hypertension ([name])   . Anemia of chronic disease   . Spasticity 03/15/2015  . Constipation, chronic 10/10/2014  . Athetoid cerebral palsy ([name]) 04/13/2014  . Dystonia 04/12/2014  . UTI (lower urinary tract infection) 03/16/2014  . Presence of intrathecal baclofen pump 03/07/2014  . Dental caries 07/18/2013  . Multiple thyroid nodules 05/20/2013  . Osteoarthrosis 04/07/2013  . [name]   . Dysarthria   . GERD (gastroesophageal reflux disease)   . Neurogenic bladder 02/05/2013  . Anxiety state 10/21/2012  . Chronic pain syndrome 06/17/2012  . Recurrent pulmonary embolism ([name]) 05/03/2011  . Pulmonary embolism with infarction ([name]) 05/03/2011  . Chronic suprapubic catheter ([name]) 03/15/2011  . Disease of female genital organs 10/24/2010  .  HYPERTENSION, BENIGN 01/31/2010  . CP (cerebral palsy), spastic ([name]) 12/27/2008  . Contracted, joint, multiple sites 12/27/2008  . Major depressive disorder, recurrent episode ([name]) 01/29/2007  . Obsessive Compulsive Disorder 01/29/2007  . Transient alteration of awareness, recurrent 09/08/2006    Past Surgical History:  Procedure Laterality Date  . ABDOMINAL HYSTERECTOMY    .  APPENDECTOMY    . BACLOFEN PUMP REFILL     x 3 times  . CARPAL TUNNEL RELEASE  08/2008   Dr Teressa Senter  . CESAREAN SECTION     x 2  . CHOLECYSTECTOMY  10/14/2015   Procedure: LAPAROSCOPIC CHOLECYSTECTOMY;  Surgeon: Violeta Gelinas, MD;  Location: [facility] OR;  Service: General;;  . COLPOSCOPY  06/2000  . INTRAUTERINE DEVICE INSERTION  04/30/11   Inserted by [facility] GYN for endometriosis  . LAPAROSCOPIC ASSISTED VAGINAL HYSTERECTOMY  10/27/2012  . MULTIPLE EXTRACTIONS WITH ALVEOLOPLASTY Bilateral 07/19/2013   Procedure: EXTRACTIONS #4, 1,61,09,60;  Surgeon: Francene Finders, DDS;  Location: [facility] OR;  Service: Oral Surgery;  Laterality: Bilateral;  . NEUROMA SURGERY Left    anterior and posterior intersosseous  . PAIN PUMP IMPLANTATION N/A 03/07/2014   Procedure: baclofen pump revision/replacement and Catheter connection replacement;  Surgeon: Cristi Loron, MD;  Location: [facility];  Service: Neurosurgery;  Laterality: N/A;  baclofen pump revision/replacement and Catheter connection replacement  . PROGRAMABLE BACLOFEN PUMP REVISION  03/17/14   Battery Replacement   . TUBAL LIGATION  2003  . URETHRA SURGERY    . WRIST SURGERY  06/2010   Dr Dierdre Searles, hand surgeon, [name]     OB History   None      Home Medications    Prior to Admission medications   Medication Sig Start Date End Date Taking? Authorizing Provider  acetaminophen (TYLENOL) 325 MG tablet Take 650 mg by mouth 3 (three) times daily as needed (For pain.). Do not exceed 4 grams in 24 hours.   Yes [provider]  acetaminophen (TYLENOL) 500  MG tablet Take 500-650 mg by mouth See admin instructions. 500mg  three times daily and 650 three times daily as needed for pain (do not exceed 4000mg  in 24hrs)   Yes [provider]  amantadine (SYMMETREL) 100 MG capsule Take 100 mg by mouth daily.    Yes [provider]  antiseptic oral rinse (BIOTENE) LIQD 15 mLs by Mouth Rinse route as needed for dry mouth.   Yes [provider]  baclofen (LIORESAL) 20 MG tablet Take 20 mg by mouth 4 (four) times daily. For bladder spasms   Yes [provider]  bisacodyl (DULCOLAX) 10 MG suppository Place 10 mg rectally as needed (For constipation.).   Yes [provider]  Buprenorphine HCl (BELBUCA) 150 MCG FILM Place 150 mcg inside cheek every 12 (twelve) hours.   Yes [provider]  busPIRone (BUSPAR) 5 MG tablet Take 5 mg by mouth 3 (three) times daily.   Yes [provider]  clonazePAM (KLONOPIN) 0.5 MG tablet Take 1.5 tablets (0.75 mg total) by mouth 3 (three) times daily. 11/27/17  Yes Narda Bonds, MD  ferrous sulfate 325 (65 FE) MG tablet Take 325 mg by mouth daily.    Yes [provider]  FLUoxetine (PROZAC) 40 MG capsule Take 80 mg by mouth daily.   Yes [provider]  lactulose, encephalopathy, (ENULOSE) 10 GM/15ML SOLN Take 20 g by mouth daily as needed (For constipation.).   Yes [provider]  lamoTRIgine (LAMICTAL) 100 MG tablet Take 100 mg by mouth at bedtime.   Yes [provider]  lubiprostone (AMITIZA) 24 MCG capsule Take 1 capsule (24 mcg total) by mouth 2 (two) times daily with a meal. 01/01/17  Yes Esterwood, [name] S, PA-C  Melatonin 5 MG TABS Take 5 mg by mouth at bedtime.    Yes [provider]  Multiple Vitamins-Minerals (CERTA-VITE PO) Take 1 tablet by mouth daily.   Yes [provider]  ondansetron (ZOFRAN) 4 MG tablet Take 4 mg by mouth every 6 (six) hours.   Yes [provider]  OVER THE COUNTER MEDICATION  Take 1 Package by mouth 3 (three) times daily with meals. Magic cup    Yes [provider]  oxybutynin (DITROPAN) 5 MG tablet Take 5 mg by mouth 4 (four) times daily.  02/20/16  Yes [provider]  oxyCODONE (OXY IR/ROXICODONE) 5 MG immediate release tablet Take 5 mg by mouth every 4 (four) hours as needed for severe pain (For pain.).   Yes [provider]  polyethylene glycol powder (GLYCOLAX/MIRALAX) powder Take 1  1/2 dose  daily in 12 ounces of fluid every day. Patient taking differently: Take 17 g by mouth 2 (two) times daily. Mix in 8 ounces of liquid. 01/01/17  Yes Esterwood, [name] S, PA-C  QUEtiapine (SEROQUEL) 50 MG tablet Take 150 mg by mouth at bedtime.   Yes [provider]  tiZANidine (ZANAFLEX) 4 MG capsule Take 4 mg by mouth 2 (two) times daily.    Yes [provider]  warfarin (COUMADIN) 6 MG tablet Take 9 mg by mouth every evening.    Yes [provider]    Family History Family History  Problem Relation Age of Onset  . Asthma Father   . Colon cancer Maternal Grandmother        Died in her 50's  . Breast cancer Paternal Grandmother        Died in her 1's  . Heart attack Maternal Grandfather        Died in his 42's  . Alzheimer's disease Paternal Grandfather        Died in his 79's  . Breast cancer Mother   . Stomach cancer Neg Hx     Social History Social History   Tobacco Use  . Smoking status: Never Smoker  . Smokeless tobacco: Never Used  Substance Use Topics  . Alcohol use: Yes    Alcohol/week: 0.6 oz    Types: 1 Glasses of wine per week    Comment: Drinks alcohol once a month.  . Drug use: No     Allergies   Morphine   Review of Systems Review of Systems  All other systems reviewed and are negative.    Physical Exam Updated Vital Signs BP 128/84   Pulse 81   Temp 99.3 F (37.4 C) (Oral)   Resp 18   Ht 1.473 m (4\' 10" )   Wt 48.5 kg (107 lb)   LMP 04/05/2011   SpO2 96%   BMI 22.36  kg/m   Physical Exam  Constitutional: She is oriented to person, place, and time. She appears well-developed and well-nourished.  Non-toxic appearance. No distress.  HENT:  Head: Normocephalic and atraumatic.  Eyes: Pupils are equal, round, and reactive to light. Conjunctivae, EOM and lids are normal.  Neck: Normal range of motion. Neck supple. No tracheal deviation present. No thyroid mass present.  Cardiovascular: Normal rate, regular rhythm and normal heart sounds. Exam reveals no gallop.  No murmur heard. Pulmonary/Chest: Effort normal and breath sounds normal. No stridor. No respiratory distress. She has no decreased breath sounds. She has no wheezes. She has no rhonchi. She has no rales.  Abdominal: Soft. Normal appearance and bowel sounds are normal. She exhibits no distension. There is no tenderness. There is no rebound and no CVA tenderness.  Musculoskeletal:  Normal range of motion. She exhibits no edema or tenderness.  Neurological: She is alert and oriented to person, place, and time. She displays atrophy. No cranial nerve deficit or sensory deficit. She exhibits abnormal muscle tone. GCS eye subscore is 4. GCS verbal subscore is 5. GCS motor subscore is 6.  Patient with extension contractures noted bilateral lower extremities.  Skin: Skin is warm and dry. No abrasion and no rash noted.  Psychiatric: She has a normal mood and affect. Her speech is normal and behavior is normal.  Nursing note and vitals reviewed.    ED Treatments / Results  Labs (all labs ordered are listed, but only abnormal results are displayed) Labs Reviewed  CBC WITH DIFFERENTIAL/PLATELET - Abnormal; Notable for the following components:      Result Value   RDW 16.2 (*)    Eosinophils Absolute 1.0 (*)    All other components within normal limits  CULTURE, BLOOD (ROUTINE X 2)  CULTURE, BLOOD (ROUTINE X 2)  URINE CULTURE  COMPREHENSIVE METABOLIC PANEL  URINALYSIS, ROUTINE W REFLEX MICROSCOPIC    PROTIME-INR  I-STAT CG4 LACTIC ACID, ED    EKG None  Radiology No results found.  Procedures Procedures (including critical care time)  Medications Ordered in ED Medications  0.9 %  sodium chloride infusion (1,000 mLs Intravenous New Bag/Given 05/25/18 1637)  metoCLOPramide (REGLAN) injection 5 mg (5 mg Intravenous Given 05/25/18 1637)  diphenhydrAMINE (BENADRYL) injection 12.5 mg (12.5 mg Intravenous Given 05/25/18 1635)     Initial Impression / Assessment and Plan / ED Course  I have reviewed the triage vital signs and the nursing notes.  Pertinent labs & imaging results that were available during my care of the patient were reviewed by me and considered in my medical decision making (see chart for details).    Patient complains of frontal headache.  She has no signs of nuchal rigidity or concern for meningitis.  Head CT was negative and was performed because she is on Coumadin. Patient with evidence of UTI here urinalysis for review of her old microbiology report shows that she has had E. coli which is resistant to oral medications.  She does have a fever here.  Patient start on IV Zosyn and will be admitted to the hospitalist service  Final Clinical Impressions(s) / ED Diagnoses   Final diagnoses:  None    ED Discharge Orders    None       Lorre Nick, MD 05/25/18 Corky Crafts

## 2018-05-25 NOTE — ED Triage Notes (Signed)
Per EMS, comes from [place]. Extensive medical history. Hx Cerebral palsy. C/o headache for 3 days. Urine has a foul odor from foley bag but patient states this does not feel like a UTI. Fever per EMS- no fever on arrival. On coumadin. Papers at bedside.

## 2018-05-25 NOTE — ED Notes (Signed)
Attempted to start IV line and get labs, unsuccessful. Point RN to try.

## 2018-05-26 ENCOUNTER — Other Ambulatory Visit: Payer: Self-pay

## 2018-05-26 DIAGNOSIS — N3000 Acute cystitis without hematuria: Secondary | ICD-10-CM | POA: Diagnosis not present

## 2018-05-26 DIAGNOSIS — K5900 Constipation, unspecified: Secondary | ICD-10-CM | POA: Diagnosis present

## 2018-05-26 DIAGNOSIS — F419 Anxiety disorder, unspecified: Secondary | ICD-10-CM | POA: Diagnosis present

## 2018-05-26 DIAGNOSIS — T83518A Infection and inflammatory reaction due to other urinary catheter, initial encounter: Secondary | ICD-10-CM | POA: Diagnosis present

## 2018-05-26 DIAGNOSIS — G808 Other cerebral palsy: Secondary | ICD-10-CM | POA: Diagnosis not present

## 2018-05-26 DIAGNOSIS — R509 Fever, unspecified: Secondary | ICD-10-CM | POA: Diagnosis not present

## 2018-05-26 DIAGNOSIS — Y846 Urinary catheterization as the cause of abnormal reaction of the patient, or of later complication, without mention of misadventure at the time of the procedure: Secondary | ICD-10-CM | POA: Diagnosis present

## 2018-05-26 DIAGNOSIS — Z87440 Personal history of urinary (tract) infections: Secondary | ICD-10-CM | POA: Diagnosis not present

## 2018-05-26 DIAGNOSIS — R109 Unspecified abdominal pain: Secondary | ICD-10-CM | POA: Diagnosis not present

## 2018-05-26 DIAGNOSIS — F329 Major depressive disorder, single episode, unspecified: Secondary | ICD-10-CM | POA: Diagnosis present

## 2018-05-26 DIAGNOSIS — Z1612 Extended spectrum beta lactamase (ESBL) resistance: Secondary | ICD-10-CM | POA: Diagnosis present

## 2018-05-26 DIAGNOSIS — Z86711 Personal history of pulmonary embolism: Secondary | ICD-10-CM | POA: Diagnosis not present

## 2018-05-26 DIAGNOSIS — Z885 Allergy status to narcotic agent status: Secondary | ICD-10-CM | POA: Diagnosis not present

## 2018-05-26 DIAGNOSIS — Z7901 Long term (current) use of anticoagulants: Secondary | ICD-10-CM | POA: Diagnosis not present

## 2018-05-26 DIAGNOSIS — F429 Obsessive-compulsive disorder, unspecified: Secondary | ICD-10-CM | POA: Diagnosis present

## 2018-05-26 DIAGNOSIS — Z993 Dependence on wheelchair: Secondary | ICD-10-CM | POA: Diagnosis not present

## 2018-05-26 DIAGNOSIS — N390 Urinary tract infection, site not specified: Secondary | ICD-10-CM | POA: Diagnosis present

## 2018-05-26 DIAGNOSIS — R279 Unspecified lack of coordination: Secondary | ICD-10-CM | POA: Diagnosis not present

## 2018-05-26 DIAGNOSIS — Z743 Need for continuous supervision: Secondary | ICD-10-CM | POA: Diagnosis not present

## 2018-05-26 DIAGNOSIS — G801 Spastic diplegic cerebral palsy: Secondary | ICD-10-CM | POA: Diagnosis present

## 2018-05-26 DIAGNOSIS — Z79899 Other long term (current) drug therapy: Secondary | ICD-10-CM | POA: Diagnosis not present

## 2018-05-26 DIAGNOSIS — I2699 Other pulmonary embolism without acute cor pulmonale: Secondary | ICD-10-CM | POA: Diagnosis not present

## 2018-05-26 DIAGNOSIS — K219 Gastro-esophageal reflux disease without esophagitis: Secondary | ICD-10-CM | POA: Diagnosis present

## 2018-05-26 LAB — COMPREHENSIVE METABOLIC PANEL
ALBUMIN: 3.2 g/dL — AB (ref 3.5–5.0)
ALK PHOS: 76 U/L (ref 38–126)
ALT: 13 U/L (ref 0–44)
ANION GAP: 4 — AB (ref 5–15)
AST: 18 U/L (ref 15–41)
BUN: 12 mg/dL (ref 6–20)
CALCIUM: 8.1 mg/dL — AB (ref 8.9–10.3)
CO2: 28 mmol/L (ref 22–32)
Chloride: 107 mmol/L (ref 98–111)
Creatinine, Ser: 0.57 mg/dL (ref 0.44–1.00)
GFR calc non Af Amer: >60 mL/min (ref >60)
GLUCOSE: 80 mg/dL (ref 70–99)
POTASSIUM: 3.9 mmol/L (ref 3.5–5.1)
Sodium: 139 mmol/L (ref 135–145)
TOTAL PROTEIN: 6.2 g/dL — AB (ref 6.5–8.1)
Total Bilirubin: 0.8 mg/dL (ref 0.3–1.2)

## 2018-05-26 LAB — CBC
HEMATOCRIT: 34.1 % — AB (ref 36.0–46.0)
HEMOGLOBIN: 11.1 g/dL — AB (ref 12.0–15.0)
MCH: 29.7 pg (ref 26.0–34.0)
MCHC: 32.6 g/dL (ref 30.0–36.0)
MCV: 91.2 fL (ref 78.0–100.0)
Platelets: 268 10*3/uL (ref 150–400)
RBC: 3.74 MIL/uL — AB (ref 3.87–5.11)
RDW: 16.2 % — ABNORMAL HIGH (ref 11.5–15.5)
WBC: 5.6 10*3/uL (ref 4.0–10.5)

## 2018-05-26 LAB — PROTIME-INR
INR: 1.79
PROTHROMBIN TIME: 20.6 s — AB (ref 11.4–15.2)

## 2018-05-26 MED ORDER — METOCLOPRAMIDE HCL 5 MG/ML IJ SOLN
10.0000 mg | Freq: Once | INTRAMUSCULAR | Status: AC
  Administered 2018-05-26: 10 mg via INTRAVENOUS
  Filled 2018-05-26: qty 2

## 2018-05-26 MED ORDER — ACETAMINOPHEN 500 MG PO TABS
1000.0000 mg | ORAL_TABLET | Freq: Four times a day (QID) | ORAL | Status: DC | PRN
  Administered 2018-05-26 – 2018-05-27 (×2): 1000 mg via ORAL
  Filled 2018-05-26 (×2): qty 2

## 2018-05-26 MED ORDER — ADULT MULTIVITAMIN W/MINERALS CH
1.0000 | ORAL_TABLET | Freq: Every day | ORAL | Status: DC
  Administered 2018-05-27 – 2018-05-29 (×3): 1 via ORAL
  Filled 2018-05-26 (×3): qty 1

## 2018-05-26 MED ORDER — KETOROLAC TROMETHAMINE 30 MG/ML IJ SOLN
30.0000 mg | Freq: Once | INTRAMUSCULAR | Status: AC
  Administered 2018-05-26: 30 mg via INTRAVENOUS
  Filled 2018-05-26: qty 1

## 2018-05-26 MED ORDER — WARFARIN SODIUM 6 MG PO TABS
12.0000 mg | ORAL_TABLET | Freq: Once | ORAL | Status: AC
  Administered 2018-05-26: 12 mg via ORAL
  Filled 2018-05-26: qty 2

## 2018-05-26 MED ORDER — DIPHENHYDRAMINE HCL 50 MG/ML IJ SOLN
50.0000 mg | Freq: Once | INTRAMUSCULAR | Status: AC
Start: 2018-05-26 — End: 2018-05-26
  Administered 2018-05-26: 50 mg via INTRAVENOUS
  Filled 2018-05-26: qty 1

## 2018-05-26 MED ORDER — DIPHENHYDRAMINE HCL 50 MG/ML IJ SOLN
25.0000 mg | Freq: Once | INTRAMUSCULAR | Status: AC
  Administered 2018-05-26: 25 mg via INTRAVENOUS
  Filled 2018-05-26: qty 1

## 2018-05-26 MED ORDER — ENSURE ENLIVE PO LIQD
237.0000 mL | Freq: Two times a day (BID) | ORAL | Status: DC
  Administered 2018-05-26 – 2018-05-28 (×2): 237 mL via ORAL

## 2018-05-26 NOTE — Progress Notes (Signed)
Initial Nutrition Assessment  DOCUMENTATION CODES:   Not applicable  INTERVENTION:   Ensure Enlive po BID, each supplement provides 350 kcal and 20 grams of protein  NUTRITION DIAGNOSIS:   Inadequate oral intake related to decreased appetite as evidenced by per patient/family report   GOAL:   Patient will meet greater than or equal to 90% of their needs  MONITOR:   PO intake, Supplement acceptance, Weight trends, Labs  REASON FOR ASSESSMENT:   Malnutrition Screening Tool   ASSESSMENT:   Patient with PMH significant for cerebral palsy, multiple site contractures, HTN, and seizures. Presents this admission with fever with UTI and constipation.    Pt reports having decreased appetite over the last 2-3 weeks. States PTA she received three meals/day that consist of a meat, vegetable, and grain. She typically finished 100% but during this time period she could finish 50%. RD observed a chocolate milk and regular milk at bedside, 100% consumed. Meal completions charted as 50-100% for her last two meals. RD ordered pt lunch that consisted of chef salad, baked potato, and two milks. Pt amendable to Ensure this stay.   Pt unsure of UBW and endorses a recent wt loss of 30 lb in 1.5 years. Records indicate pt weighed 108 lb 11/24/17 and 105 lb this admission. Weights from 2017 look to be stated making it hard to determine actual wt loss.   Medications reviewed and include: ferrous sulfate, MVI with minerals Labs reviewed.   NUTRITION - FOCUSED PHYSICAL EXAM:  Deferred due to bed bound CP.   Diet Order:   Diet Order           Diet regular Room service appropriate? Yes; Fluid consistency: Thin  Diet effective now          EDUCATION NEEDS:   Education needs have been addressed  Skin:  Skin Assessment: Reviewed RN Assessment  Last BM:  05/25/18  Height:   Ht Readings from Last 1 Encounters:  05/25/18 4\' 10"  (1.473 m)    Weight:   Wt Readings from Last 1 Encounters:   05/25/18 105 lb 9.6 oz (47.9 kg)    Ideal Body Weight:  41.7 kg  BMI:  Body mass index is 22.07 kg/m.  Estimated Nutritional Needs:   Kcal:  1200-1400 kcal  Protein:  60-70 g  Fluid:  >1.2 L/day    Vanessa Kickarly Latara Micheli RD, LDN Clinical Nutrition Pager # [phone]- [phone]

## 2018-05-26 NOTE — Progress Notes (Signed)
PROGRESS NOTE    [name] [name]  ZOX:[id number] DOB: 01-01-1976 DOA: 05/25/2018 PCP: Lacinda Axon    Brief Narrative:  42 y.o. female, w cerebral palsy, hypertension, recurrent PE, h/o ESBL uti, who presents with fever, and was found to have UTI.  Ros: + slight frontal headache x3 days (pt admits to prior similar headaches), and dry cough   Assessment & Plan:   Principal Problem:   UTI (urinary tract infection) - Continue current antibiotic Meropenem  Active Problems:    Fever - secondary to infectious etiology continue acetaminophen and antibiotics  - Blood and urine cultures remain negative.     Cerebral palsy, quadriplegic ([name]) - Noted    Pulmonary embolism with infarction ([name]) - Will continue Warfarin per pharmacy dosing    Headache - Acetaminophen and Oxy IR on board - will attempt to use Benadryl - nsaids contraindicated due to PE    DVT prophylaxis: Pt on coumadin Code Status: Full Family Communication: None at bedside Disposition Plan: pending improvement in condition  Consultants:   None   Procedures: none   Antimicrobials: Meropenem   Subjective: Pt complaining of headaches.  Objective: Vitals:   05/25/18 2115 05/25/18 2206 05/26/18 0509 05/26/18 1458  BP: 107/90 100/80 133/83 116/85  Pulse: 84 85 84 94  Resp: 16 15 16    Temp:  98.6 F (37 C) 98.5 F (36.9 C) (!) 97.5 F (36.4 C)  TempSrc:  Oral Oral Oral  SpO2: 92% 96% 94% 95%  Weight:  47.9 kg (105 lb 9.6 oz)    Height:  4\' 10"  (1.473 m)      Intake/Output Summary (Last 24 hours) at 05/26/2018 1526 Last data filed at 05/26/2018 1503 Gross per 24 hour  Intake 2620.42 ml  Output 2300 ml  Net 320.42 ml   Filed Weights   05/25/18 1409 05/25/18 2206  Weight: 48.5 kg (107 lb) 47.9 kg (105 lb 9.6 oz)    Examination:  General exam: Appears calm and comfortable, in nad. Respiratory system: Clear to auscultation. Respiratory effort normal. Equal chest rise.  Cardiovascular system:  S1 & S2 heard, RRR. No JVD, murmurs, rubs, gallops  Gastrointestinal system: Abdomen is nondistended, soft and nontender. No organomegaly or masses felt. Normal bowel sounds heard. Central nervous system: Alert and oriented. No focal neurological deficits. Extremities: Symmetric 5 x 5 power. Skin: No rashes, lesions or ulcers, on limited exam. Psychiatry:  Mood & affect appropriate.   Data Reviewed: I have personally reviewed following labs and imaging studies  CBC: Recent Labs  Lab 05/25/18 1622 05/26/18 0617  WBC 8.7 5.6  NEUTROABS 5.0  --   HGB 12.9 11.1*  HCT 39.8 34.1*  MCV 90.5 91.2  PLT 327 268   Basic Metabolic Panel: Recent Labs  Lab 05/25/18 1622 05/26/18 0617  NA 140 139  K 4.3 3.9  CL 103 107  CO2 29 28  GLUCOSE 93 80  BUN 12 12  CREATININE 0.52 0.57  CALCIUM 9.1 8.1*   GFR: Estimated Creatinine Clearance: 57.9 mL/min (by C-G formula based on SCr of 0.57 mg/dL). Liver Function Tests: Recent Labs  Lab 05/25/18 1622 05/26/18 0617  AST 17 18  ALT 16 13  ALKPHOS 89 76  BILITOT 0.4 0.8  PROT 7.9 6.2*  ALBUMIN 4.1 3.2*   No results for input(s): LIPASE, AMYLASE in the last 168 hours. No results for input(s): AMMONIA in the last 168 hours. Coagulation Profile: Recent Labs  Lab 05/25/18 1622 05/26/18 0617  INR 1.68 1.79  Cardiac Enzymes: No results for input(s): CKTOTAL, CKMB, CKMBINDEX, TROPONINI in the last 168 hours. BNP (last 3 results) No results for input(s): PROBNP in the last 8760 hours. HbA1C: No results for input(s): HGBA1C in the last 72 hours. CBG: No results for input(s): GLUCAP in the last 168 hours. Lipid Profile: No results for input(s): CHOL, HDL, LDLCALC, TRIG, CHOLHDL, LDLDIRECT in the last 72 hours. Thyroid Function Tests: No results for input(s): TSH, T4TOTAL, FREET4, T3FREE, THYROIDAB in the last 72 hours. Anemia Panel: No results for input(s): VITAMINB12, FOLATE, FERRITIN, TIBC, IRON, RETICCTPCT in the last 72  hours. Sepsis Labs: Recent Labs  Lab 05/25/18 1629  LATICACIDVEN 1.09    Recent Results (from the past 240 hour(s))  Blood Culture (routine x 2)     Status: None (Preliminary result)   Collection Time: 05/25/18  4:22 PM  Result Value Ref Range Status   Specimen Description   Final    BLOOD LEFT FOREARM Performed at [facility], [address] [place], [place] [address]    Special Requests   Final    BOTTLES DRAWN AEROBIC AND ANAEROBIC Blood Culture adequate volume Performed at [facility], [address] [place], [place] [address]    Culture   Final    NO GROWTH < 24 HOURS Performed at [facility], [address] [place], [place] [address]    Report Status PENDING  Incomplete         Radiology Studies: Ct Head Wo Contrast  Result Date: 05/25/2018 CLINICAL DATA:  Headache for 3 days. EXAM: CT HEAD WITHOUT CONTRAST TECHNIQUE: Contiguous axial images were obtained from the base of the skull through the vertex without intravenous contrast. COMPARISON:  07/17/2011 FINDINGS: Brain: Sylvian fissure is somewhat effaced with respect to that of the left. This is stable compared to the prior study. There is no definitive mass effect, midline shift, or acute hemorrhage. There is no evidence of hydrocephalus. Vascular: No hyperdense vessel or unexpected calcification. Skull: Cranium is intact. Sinuses/Orbits: Mastoid air cells are clear. Visualized paranasal sinuses are clear. Orbits are within normal limits. Other: Noncontributory. IMPRESSION: No acute intracranial pathology. Electronically Signed   By: Jolaine Click M.D.   On: 05/25/2018 18:18   Dg Chest Port 1 View  Result Date: 05/25/2018 CLINICAL DATA:  Headaches and fever.  Shortness of breath. EXAM: PORTABLE CHEST 1 VIEW COMPARISON:  Chest radiograph 11/24/2017 FINDINGS: Shallow lung inflation without focal airspace consolidation. No pleural effusion. Distended bowel is again seen in  left upper quadrant. IMPRESSION: Shallow lung inflation without focal airspace disease. Electronically Signed   By: Deatra Robinson M.D.   On: 05/25/2018 17:29    Scheduled Meds: . amantadine  100 mg Oral Daily  . baclofen  20 mg Oral QID  . Buprenorphine HCl  150 mcg Buccal Q12H  . busPIRone  5 mg Oral TID  . clonazePAM  0.75 mg Oral TID  . diphenhydrAMINE  50 mg Intravenous Once  . feeding supplement (ENSURE ENLIVE)  237 mL Oral BID BM  . ferrous sulfate  325 mg Oral QPC breakfast  . FLUoxetine  80 mg Oral Daily  . lamoTRIgine  100 mg Oral QHS  . lubiprostone  24 mcg Oral BID WC  . Melatonin  5 mg Oral QHS  . [START ON 05/27/2018] multivitamin with minerals  1 tablet Oral Daily  . ondansetron  4 mg Oral Q6H  . oxybutynin  5 mg Oral QID  . polyethylene glycol  17 g Oral BID  . QUEtiapine  150 mg Oral QHS  . tiZANidine  4 mg Oral BID  . Warfarin - Pharmacist Dosing Inpatient   Does not apply q1800   Continuous Infusions: . sodium chloride 1,000 mL (05/26/18 0825)  . meropenem (MERREM) IV Stopped (05/26/18 1301)     LOS: 0 days    Time spent: 40 min  Penny Piarlando Latrisha Coiro, MD [facility] Pager [phone]  If 7PM-7AM, please contact night-coverage [email or web address] Password [email or web address] 05/26/2018, 3:26 PM

## 2018-05-26 NOTE — Progress Notes (Signed)
ANTICOAGULATION CONSULT NOTE - Initial Consult  Pharmacy Consult for Warfarin Indication: pulmonary embolus  Allergies  Allergen Reactions  . Morphine Dermatitis and Other (See Comments)    Skin turned red    Patient Measurements: Height: 4\' 10"  (147.3 cm) Weight: 105 lb 9.6 oz (47.9 kg) IBW/kg (Calculated) : 40.9  Vital Signs: Temp: 98.1 F (36.7 C) (06/25 1532) Temp Source: Axillary (06/25 1532) BP: 116/85 (06/25 1458) Pulse Rate: 94 (06/25 1458)  Labs: Recent Labs    05/25/18 1622 05/26/18 0617  HGB 12.9 11.1*  HCT 39.8 34.1*  PLT 327 268  LABPROT 19.7* 20.6*  INR 1.68 1.79  CREATININE 0.52 0.57    Estimated Creatinine Clearance: 57.9 mL/min (by C-G formula based on SCr of 0.57 mg/dL).   Medical History: Past Medical History:  Diagnosis Date  . Abdominal pain 01/02/2012   Overview:  Overview:  Per Previous pcp note- Dr. Ta--Pt has history of endometriosis and had DUB in 2011.  She She had u/s that showed normal endometrial stripe.  She had endometrial bx that was wnl.  She has a lot of abd cramping every month.  This cramping was sometimes not relieved with hydrocodone.  [facility] to help endometriosis and abd cramping.  [name]  . Abdominal pain 01/02/2012   Overview:  Overview:  Per Previous pcp note- Dr. Ta--Pt has history of endometriosis and had DUB in 2011.  She She had u/s that showed normal endometrial stripe.  She had endometrial bx that was wnl.  She has a lot of abd cramping every month.  This cramping was sometimes not relieved with hydrocodone.  [facility] for [facility]UD [facility] to help endometriosis and abd cramping.  [name]  Pt still complains of abd cramping, which I am treating with hydrocodone and ultram. Pt reports some initial relief of pain from IUD, but states it has now returned back to similar to previous cramping. Gave patient a shot of Toradol 60 mg IM  today-since positive exacerbation of abdominal cramping during past 2 weeks. Physical exam reassuring. [facility] since she is plugged in with the GYN providers there for further treatment options regarding endometriosis and abdominal cramping. The dysfunctional uterine bleeding now well contr  . Acute cystitis with hematuria   . Acute cystitis without hematuria   . Acute respiratory failure with hypoxia ([name])   . Anemia   . Cellulitis 12/22/2014  . Cerebral palsy ([name])    Spastic Cerebral palsy, mentally intact  . Contracture, joint, multiple sites    Electric wheelchair, uses left hand to operate chair.   . Depression   . Disease of female genital organs 10/24/2010   Overview:  Overview:  Pt has history of endometriosis and had DUB in 2011.  She had u/s that showed normal endometrial stripe.  She had endometrial bx that was wnl.  She has a lot of abd cramping every month.  This cramping was sometimes not relieved with hydrocodone.  [facility] for IUD placement to help endometriosis and abd cramping.  [name]  Pt still complains of abd cramping, which was treated with hydrocodone and ultram.   [facility], given Depo Lupron injection.  Ordering CT scan to r/o additional cause of abdominal pain (pt with known endometriosis and will not tolerate vaginal U/S,  pain not improved s/p [name] or with Lupron injection-- only caused hot flashes and amenorrhea, no pain relief).  Also made referral to pain clinic for better pain control.  Do not think hysterectomy would help with pain, and concern for contamination of baclofen pump if did proceed with surgery.    8/5/13[name] [facility]: Pt was seen by   . [name]   . Dysarthria   . Dyspnea and respiratory abnormality 02/04/2011   Overview:  Overview:  Pt has history of recurrent PE and is on chronic coumadin.  Pt  has been complaining of dyspnea that she describes as chest tightness.  She has been to the ER multiple times for this.  Usually she gets CTA and serial CE.  She has had at least 6 CTA in a period of since 2011.  She also has an O2 requirement. It is unclear why pt feels dyspneic, complains of chest tightness, or has O2 requirement.  I thought that her complaint may be from deconditioning and referred her to PT.  Unfortunately Medicaid only pays for 4 PT visits.  Pt should be doing these exercises at home, but she has not.  I have referred her to [facility], and she has been seen by [name].  He did not understand the etiology of her dyspnea and O2 requirement.  He will continue to follow her.   Last Assessment & Plan:  Pt presents with concerns for chest tightness and dyspnea.  She was seen in ED yesterday and discharged home.  While there she had a CT chest 04/20/11 showed 1.  Multifocal degradation as detailed above.  N  . Endometriosis   . GERD (gastroesophageal reflux disease)   . Gram positive sepsis ([name]) 10/24/2014  . Gross hematuria 03/12/2013   Overview:  Overview:  status post replacement of suprapubic tube 03/04/2013  Last Assessment & Plan:  Recent urine culture collected in ED on 5/4 did not result in significant growth. Moreover, patient has been asymptomatic with normal WBC, no fever and no true symptoms making symptomatic UTI less likely.  Will continue to monitor for any sign of infection.  Patient's xarelto had been held during her recent hospitalization due to hematuria. This has since been re-started. Monitor bleeding.    [name] Kitchen HCAP (healthcare-associated pneumonia) 10/25/2014  . HCAP (healthcare-associated pneumonia) 11/24/2017  . History of anticoagulant therapy 10/21/2012  . History of endometriosis 10/2012   OBGYN [facility]: Laparoscopic Endometrial Ablation, TVH,  . History of recurrent UTIs   . Hypertension   . HYPERTENSION, BENIGN 01/31/2010   Qualifier: Diagnosis of  By: [name], MD,  [name] Incontinent of feces 03/11/2016  . Infantile cerebral palsy ([name]) 01/29/2007   Overview:  She has spastic CP.  She is mentally intact.  She is wheelchair bound.  She is wheelchair bound for ambulation      . Intracervical pessary 05/07/2011   Overview:  Overview:  Placed by [facility] 04/30/11 to treat DUB and Endometriosis.  She is seen at GYN clinic at [facility] but was considered a poor surgical candidate and referred her to [facility].  For DUB she had pelvic ultrasound that showed thin stripe and she had endometrial Bx by Dr Jennette Kettle in GYN clinic, which was negative.     . Macroscopic hematuria 03/10/2013   status post replacement of suprapubic tube 03/04/2013   . Migraine   . OCD (obsessive compulsive disorder)   . Parapneumonic effusion 10/25/2014  . Presence  of intrathecal baclofen pump 03/07/2014   R LQ. Insertion T10.    [name] Kitchen Psychiatric illness 10/31/2014  . Pulmonary embolism ([name])    Lifetime Coumadin  . Pulmonary embolism ([name]) 2011, 01/2011   Will be on lifetime coumadin  . Recurrent pulmonary embolism ([name]) 05/03/2011   Pt has history of recurrent PE and is on chronic coumadin.  Pt has been complaining of dyspnea that she describes as chest tightness.  She has been to the ER multiple times for this.  Usually she gets CTA and serial CE.  She has had at least 6 CTA in a period of since 2011.  She also has an O2 requirement. It is unclear why pt feels dyspneic, complains of chest tightness, or has O2 requirement.  I thought that her complaint may be from deconditioning and referred her to PT.  Unfortunately Medicaid only pays for 4 PT visits.  Pt should be doing these exercises at home, but she has not.  I have referred her to [facility], and she has been seen by [name].  He did not understand the etiology of her dyspnea and O2 requirement.  He will continue to follow her.     . Seborrheic keratosis 01/18/2011  . Seizures ([name]) 02/12/2016  . Spasticity 01/05/2014  . Transient  alteration of awareness, recurrent 09/08/2006   Recurrent episodes where Latissa  loses contact with her world and that have been studied thoroughly and appear nonepileptic in nature per Dr Terrace Arabia (neurology) The patient has had episodes starting in 2007 that initially began 1-4 times a day during which time she would have periods of unawareness followed by confusion. This continuous video EEG monitoring performed from October 8-12, 2007. Had   . Urethra dilated and patulous 07/03/2015   Patulous, Dilated Urethra. 5mL balloon on a 22-Fr catheter hoping this would prevent the bladder spasm from pushing the balloon down the urethra (Dr Logan Bores, Urology [facility], July 2016)     Assessment: 42 y/o F presented to [facility] ED on 05/25/18 with c/o headache. PMH signficant for recurrent PE on chronic warfarin therapy, cerebral palsy, HTN, migraine, seizures, ESBL UTI. Pharmacy consulted to dose warfarin while patient in the hospital. PTA dose of warfarin reported as 9mg  PO daily, with last dose documented on nursing home [facility] as 05/24/18 at 1700. Admission INR subtherapeutic at 1.68.   Today, 05/26/18:  INR = 1.79, remains subtherapeutic  CBC: Hgb 11.1, Pltc WNL  No bleeding issues per nursing  Regular diet ordered. 50% of breakfast charted. Ensure Enlive given this afternoon.     Goal of Therapy:  INR 2-3   Plan:   Per Dr. Cena Benton, no need for bridging with lovenox/heparin at this time.  Warfarin 12mg  PO x 1 today.  Daily PT/INR  CBC in AM  Monitor closely for s/sx of bleeding   Greer Pickerel, PharmD, BCPS Pager: [phone] 05/26/2018 3:58 PM

## 2018-05-27 DIAGNOSIS — R509 Fever, unspecified: Secondary | ICD-10-CM

## 2018-05-27 DIAGNOSIS — N3000 Acute cystitis without hematuria: Secondary | ICD-10-CM

## 2018-05-27 DIAGNOSIS — G808 Other cerebral palsy: Secondary | ICD-10-CM

## 2018-05-27 DIAGNOSIS — I2699 Other pulmonary embolism without acute cor pulmonale: Secondary | ICD-10-CM

## 2018-05-27 LAB — CBC
HEMATOCRIT: 34.3 % — AB (ref 36.0–46.0)
Hemoglobin: 11.1 g/dL — ABNORMAL LOW (ref 12.0–15.0)
MCH: 29.2 pg (ref 26.0–34.0)
MCHC: 32.4 g/dL (ref 30.0–36.0)
MCV: 90.3 fL (ref 78.0–100.0)
Platelets: 230 10*3/uL (ref 150–400)
RBC: 3.8 MIL/uL — ABNORMAL LOW (ref 3.87–5.11)
RDW: 16.1 % — AB (ref 11.5–15.5)
WBC: 5.5 10*3/uL (ref 4.0–10.5)

## 2018-05-27 LAB — URINE CULTURE

## 2018-05-27 LAB — PROTIME-INR
INR: 2.11
Prothrombin Time: 23.4 seconds — ABNORMAL HIGH (ref 11.4–15.2)

## 2018-05-27 MED ORDER — BUTALBITAL-APAP-CAFFEINE 50-325-40 MG PO TABS
2.0000 | ORAL_TABLET | Freq: Four times a day (QID) | ORAL | Status: DC | PRN
  Administered 2018-05-27 – 2018-05-28 (×3): 2 via ORAL
  Filled 2018-05-27 (×3): qty 2

## 2018-05-27 MED ORDER — WARFARIN SODIUM 6 MG PO TABS
9.0000 mg | ORAL_TABLET | Freq: Once | ORAL | Status: AC
  Administered 2018-05-27: 9 mg via ORAL
  Filled 2018-05-27: qty 1

## 2018-05-27 NOTE — Progress Notes (Signed)
ANTICOAGULATION CONSULT NOTE - Initial Consult  Pharmacy Consult for Warfarin Indication: pulmonary embolus  Allergies  Allergen Reactions  . Morphine Dermatitis and Other (See Comments)    Skin turned red   Patient Measurements: Height: 4\' 10"  (147.3 cm) Weight: 118 lb 9.7 oz (53.8 kg) IBW/kg (Calculated) : 40.9  Vital Signs: Temp: 98.4 F (36.9 C) (06/26 0522) Temp Source: Oral (06/26 0522) BP: 113/81 (06/26 0522) Pulse Rate: 70 (06/26 0522)  Labs: Recent Labs    05/25/18 1622 05/26/18 0617 05/27/18 0613  HGB 12.9 11.1* 11.1*  HCT 39.8 34.1* 34.3*  PLT 327 268 230  LABPROT 19.7* 20.6* 23.4*  INR 1.68 1.79 2.11  CREATININE 0.52 0.57  --    Estimated Creatinine Clearance: 65.3 mL/min (by C-G formula based on SCr of 0.57 mg/dL).  Assessment: 42 [facility] F [facility] with c/o headache. PMH signficant for recurrent PE on chronic warfarin therapy, cerebral palsy, HTN, migraine, seizures, ESBL UTI. Pharmacy consulted to dose warfarin while patient in the hospital. PTA dose of warfarin reported as 9mg  PO daily, with [facility] at 1700. Admission INR subtherapeutic at 1.68.   Today, 05/27/18:  INR 2.11, back into therapeutic range  CBC: Hgb 11.1, Pltc WNL  No bleeding issues per nursing  Goal of Therapy:  INR 2-3   Plan:   Warfarin 9mg  PO x 1 today.  Daily PT/INR  CBC in AM  Monitor closely for s/sx of bleeding  Otho BellowsGreen, Nikoli Nasser L PharmD Pager [phone] 05/27/2018, 11:34 AM

## 2018-05-27 NOTE — Progress Notes (Signed)
PROGRESS NOTE  [name] [name] ZOX:[id number] DOB: January 19, 1976 DOA: 05/25/2018 PCP: [facility]  HPI/Recap of past 24 hours: [name] [name] is a 42 year old female with past medical history significant for cerebral palsy, hypertension, with recurrent PE, history of ESBL UTI, who presented with a fever.  Urine analysis positive.  Admitted for UTI.  05/27/2018: Patient seen and examined at her bedside.  She reports headache frontal not improved by Tylenol.  Started Fioricet.  She denies nausea or abdominal pain.  Assessment/Plan: Principal Problem:   UTI (urinary tract infection) Active Problems:   Fever   Cerebral palsy, quadriplegic ([name])   Pulmonary embolism with infarction ([name])   Headache  UTI in the setting of chronic suprapubic Catheter Exchange suprapubic catheter today 05/27/2018 Recollect urine culture  Previous urine culture positive for ESBL Treat empirically with meropenem  Monitor fever curve Monitor WBC Monitor urine output Obtain CBC in the morning  Intractable frontal headache Start Fioricet  History of recurrent PE Continue Coumadin  Cerebral palsy Continue home medications  Hypertension Blood pressures well controlled Continue home medications   Code Status: Full code  Family Communication: None at bedside  Disposition Plan: Home/SNF when clinically stable   Consultants:  None  Procedures:  Suprapubic catheter exchange on 05/27/2018  Antimicrobials:  IV meropenem  DVT prophylaxis: Coumadin   Objective: Vitals:   05/26/18 1532 05/26/18 2216 05/27/18 0500 05/27/18 0522  BP:  104/72  113/81  Pulse:  66  70  Resp:    20  Temp: 98.1 F (36.7 C) 97.7 F (36.5 C)  98.4 F (36.9 C)  TempSrc: Axillary Oral  Oral  SpO2:  96%  96%  Weight:   53.8 kg (118 lb 9.7 oz)   Height:        Intake/Output Summary (Last 24 hours) at 05/27/2018 1545 Last data filed at 05/27/2018 1017 Gross per 24 hour  Intake 360 ml  Output 2250 ml  Net -1890 ml    Filed Weights   05/25/18 1409 05/25/18 2206 05/27/18 0500  Weight: 48.5 kg (107 lb) 47.9 kg (105 lb 9.6 oz) 53.8 kg (118 lb 9.7 oz)    Exam:  . General: 42 y.o. year-old female well developed well nourished in no acute distress.  Alert and oriented x3. . Cardiovascular: Regular rate and rhythm with no rubs or gallops.  No thyromegaly or JVD noted.   [name] Kitchen Respiratory: Clear to auscultation with no wheezes or rales. Good inspiratory effort. . Abdomen: Soft nontender nondistended with normal bowel sounds x4 quadrants. . Musculoskeletal: TRace lower extremity edema. 2/4 pulses in all 4 extremities. . Skin: No ulcerative lesions noted or rashes on limited physical exam . Psychiatry: Mood is appropriate for condition and setting   Data Reviewed: CBC: Recent Labs  Lab 05/25/18 1622 05/26/18 0617 05/27/18 0613  WBC 8.7 5.6 5.5  NEUTROABS 5.0  --   --   HGB 12.9 11.1* 11.1*  HCT 39.8 34.1* 34.3*  MCV 90.5 91.2 90.3  PLT 327 268 230   Basic Metabolic Panel: Recent Labs  Lab 05/25/18 1622 05/26/18 0617  NA 140 139  K 4.3 3.9  CL 103 107  CO2 29 28  GLUCOSE 93 80  BUN 12 12  CREATININE 0.52 0.57  CALCIUM 9.1 8.1*   GFR: Estimated Creatinine Clearance: 65.3 mL/min (by C-G formula based on SCr of 0.57 mg/dL). Liver Function Tests: Recent Labs  Lab 05/25/18 1622 05/26/18 0617  AST 17 18  ALT 16 13  ALKPHOS 89 76  BILITOT  0.4 0.8  PROT 7.9 6.2*  ALBUMIN 4.1 3.2*   No results for input(s): LIPASE, AMYLASE in the last 168 hours. No results for input(s): AMMONIA in the last 168 hours. Coagulation Profile: Recent Labs  Lab 05/25/18 1622 05/26/18 0617 05/27/18 0613  INR 1.68 1.79 2.11   Cardiac Enzymes: No results for input(s): CKTOTAL, CKMB, CKMBINDEX, TROPONINI in the last 168 hours. BNP (last 3 results) No results for input(s): PROBNP in the last 8760 hours. HbA1C: No results for input(s): HGBA1C in the last 72 hours. CBG: No results for input(s): GLUCAP in  the last 168 hours. Lipid Profile: No results for input(s): CHOL, HDL, LDLCALC, TRIG, CHOLHDL, LDLDIRECT in the last 72 hours. Thyroid Function Tests: No results for input(s): TSH, T4TOTAL, FREET4, T3FREE, THYROIDAB in the last 72 hours. Anemia Panel: No results for input(s): VITAMINB12, FOLATE, FERRITIN, TIBC, IRON, RETICCTPCT in the last 72 hours. Urine analysis:    Component Value Date/Time   COLORURINE YELLOW 05/25/2018 1822   APPEARANCEUR CLEAR 05/25/2018 1822   LABSPEC 1.011 05/25/2018 1822   PHURINE 8.0 05/25/2018 1822   GLUCOSEU NEGATIVE 05/25/2018 1822   HGBUR NEGATIVE 05/25/2018 1822   HGBUR large 10/03/2010 1530   BILIRUBINUR NEGATIVE 05/25/2018 1822   BILIRUBINUR neg 03/15/2011 1719   KETONESUR NEGATIVE 05/25/2018 1822   PROTEINUR NEGATIVE 05/25/2018 1822   UROBILINOGEN 1.0 10/08/2015 2348   NITRITE POSITIVE (A) 05/25/2018 1822   LEUKOCYTESUR LARGE (A) 05/25/2018 1822   LEUKOCYTESUR mod 01/26/2014   Sepsis Labs: @LABRCNTIP (procalcitonin:4,lacticidven:4)  ) Recent Results (from the past 240 hour(s))  Blood Culture (routine x 2)     Status: None (Preliminary result)   Collection Time: 05/25/18  4:22 PM  Result Value Ref Range Status   Specimen Description   Final    BLOOD LEFT FOREARM Performed at [facility], [address], [place], [place] [address]    Special Requests   Final    BOTTLES DRAWN AEROBIC AND ANAEROBIC Blood Culture adequate volume Performed at [facility], [address], [place], [place] [address]    Culture   Final    NO GROWTH 2 DAYS Performed at [facility], [address], [place], [place] [address]    Report Status PENDING  Incomplete  Urine culture     Status: Abnormal   Collection Time: 05/25/18  6:22 PM  Result Value Ref Range Status   Specimen Description   Final    URINE, RANDOM Performed at [facility], [address] [place], [place] [address]    Special  Requests   Final    NONE Performed at [facility], [address], [place], [place] [address]    Culture MULTIPLE SPECIES PRESENT, SUGGEST RECOLLECTION (A)  Final   Report Status 05/27/2018 FINAL  Final  Blood Culture (routine x 2)     Status: None (Preliminary result)   Collection Time: 05/26/18  6:15 AM  Result Value Ref Range Status   Specimen Description   Final    BLOOD BLOOD LEFT HAND Performed at [facility], [address], [place], [place] [address]    Special Requests   Final    BOTTLES DRAWN AEROBIC AND ANAEROBIC Blood Culture adequate volume Performed at [facility], [address] [place], [place] [address]    Culture   Final    NO GROWTH 1 DAY Performed at [facility], [address] [place], [place] [address]  Report Status PENDING  Incomplete      Studies: No results found.  Scheduled Meds: . amantadine  100 mg Oral Daily  . baclofen  20 mg Oral QID  . Buprenorphine HCl  150 mcg Buccal Q12H  . busPIRone  5 mg Oral TID  . clonazePAM  0.75 mg Oral TID  . feeding supplement (ENSURE ENLIVE)  237 mL Oral BID BM  . ferrous sulfate  325 mg Oral QPC breakfast  . FLUoxetine  80 mg Oral Daily  . lamoTRIgine  100 mg Oral QHS  . lubiprostone  24 mcg Oral BID WC  . Melatonin  5 mg Oral QHS  . multivitamin with minerals  1 tablet Oral Daily  . ondansetron  4 mg Oral Q6H  . oxybutynin  5 mg Oral QID  . polyethylene glycol  17 g Oral BID  . QUEtiapine  150 mg Oral QHS  . tiZANidine  4 mg Oral BID  . warfarin  9 mg Oral ONCE-1800  . Warfarin - Pharmacist Dosing Inpatient   Does not apply q1800    Continuous Infusions: . sodium chloride 1,000 mL (05/27/18 1259)  . meropenem (MERREM) IV 1 g (05/27/18 1258)     LOS: 1 day     Darlin Droparole N Hall, MD [facility] Pager [phone]  If 7PM-7AM, please contact night-coverage [email or web address] Password [email or web address] 05/27/2018, 3:45 PM

## 2018-05-28 LAB — URINE CULTURE: CULTURE: NO GROWTH

## 2018-05-28 LAB — BASIC METABOLIC PANEL
Anion gap: 5 (ref 5–15)
BUN: 10 mg/dL (ref 6–20)
CALCIUM: 8.5 mg/dL — AB (ref 8.9–10.3)
CO2: 27 mmol/L (ref 22–32)
CREATININE: 0.53 mg/dL (ref 0.44–1.00)
Chloride: 109 mmol/L (ref 98–111)
GFR calc Af Amer: >60 mL/min (ref >60)
GFR calc non Af Amer: >60 mL/min (ref >60)
Glucose, Bld: 103 mg/dL — ABNORMAL HIGH (ref 70–99)
Potassium: 4.8 mmol/L (ref 3.5–5.1)
Sodium: 141 mmol/L (ref 135–145)

## 2018-05-28 LAB — CBC
HEMATOCRIT: 34.9 % — AB (ref 36.0–46.0)
Hemoglobin: 11.4 g/dL — ABNORMAL LOW (ref 12.0–15.0)
MCH: 30.1 pg (ref 26.0–34.0)
MCHC: 32.7 g/dL (ref 30.0–36.0)
MCV: 92.1 fL (ref 78.0–100.0)
Platelets: 261 10*3/uL (ref 150–400)
RBC: 3.79 MIL/uL — ABNORMAL LOW (ref 3.87–5.11)
RDW: 16.2 % — AB (ref 11.5–15.5)
WBC: 5.5 10*3/uL (ref 4.0–10.5)

## 2018-05-28 LAB — PROTIME-INR
INR: 2.8
PROTHROMBIN TIME: 29.2 s — AB (ref 11.4–15.2)

## 2018-05-28 MED ORDER — BUTALBITAL-APAP-CAFFEINE 50-325-40 MG PO TABS
2.0000 | ORAL_TABLET | ORAL | Status: DC | PRN
  Administered 2018-05-28 – 2018-05-29 (×4): 2 via ORAL
  Filled 2018-05-28 (×4): qty 2

## 2018-05-28 MED ORDER — WARFARIN SODIUM 4 MG PO TABS
4.0000 mg | ORAL_TABLET | Freq: Once | ORAL | Status: AC
  Administered 2018-05-28: 4 mg via ORAL
  Filled 2018-05-28: qty 1

## 2018-05-28 MED ORDER — BUTALBITAL-APAP-CAFFEINE 50-325-40 MG PO TABS: 2.0000 | ORAL_TABLET | Freq: Four times a day (QID) | ORAL | Status: DC | PRN

## 2018-05-28 MED ORDER — SODIUM CHLORIDE 0.9 % IV SOLN: 1000.0000 mL | INTRAVENOUS | Status: DC

## 2018-05-28 NOTE — Progress Notes (Signed)
ANTICOAGULATION CONSULT NOTE  Pharmacy Consult for Warfarin Indication: pulmonary embolus  Allergies  Allergen Reactions  . Morphine Dermatitis and Other (See Comments)    Skin turned red   Patient Measurements: Height: 4\' 10"  (147.3 cm) Weight: 118 lb 9.7 oz (53.8 kg) IBW/kg (Calculated) : 40.9  Vital Signs: Temp: 97.5 F (36.4 C) (06/27 0554) Temp Source: Oral (06/27 0554) BP: 110/70 (06/27 0554) Pulse Rate: 47 (06/27 0554)  Labs: Recent Labs    05/25/18 1622 05/26/18 0617 05/27/18 0613 05/28/18 0524  HGB 12.9 11.1* 11.1* 11.4*  HCT 39.8 34.1* 34.3* 34.9*  PLT 327 268 230 261  LABPROT 19.7* 20.6* 23.4* 29.2*  INR 1.68 1.79 2.11 2.80  CREATININE 0.52 0.57  --  0.53   Estimated Creatinine Clearance: 65.3 mL/min (by C-G formula based on SCr of 0.53 mg/dL).  Assessment: 42 y/o F [facility] with c/o headache. PMH signficant for recurrent PE on chronic warfarin therapy, cerebral palsy, HTN, migraine, seizures, ESBL UTI. Pharmacy consulted to dose warfarin while patient in the hospital. PTA dose of warfarin reported as 9mg  PO daily, with [facility]. Admission INR subtherapeutic at 1.68.   Today, 05/28/18:  INR 2.80, increased to high end of therapeutic range  Warfarin resumed 6/24 with 9mg  dose, followed by 12mg , 9mg   CBC: Hgb 11.4, unchanged. Pltc WNL  No bleeding issues per nursing  Goal of Therapy:  INR 2-3   Plan:   Warfarin 4mg  today at 1800  Daily PT/INR  Monitor closely for s/sx of bleeding  Otho BellowsGreen, Chaunice Obie L PharmD Pager [phone] 05/28/2018, 6:57 AM

## 2018-05-28 NOTE — Clinical Social Work Note (Signed)
Clinical Social Work Assessment  Patient Details  Name: [name] SlickerJudith Schappert MRN: [id number] Date of Birth: 10-08-1976  Date of referral:  05/28/18               Reason for consult:  Facility Placement                Permission sought to share information with:  Case Manager, Magazine features editoracility Contact Representative, Family Supports Permission granted to share information::  Yes, Verbal Permission Granted  Name::     Father  Agency::  [facility]  Relationship::     Contact Information:     Housing/Transportation Living arrangements for the past 2 months:  Skilled Building surveyorursing Facility Source of Information:  Facility, Patient Patient Interpreter Needed:  None(Speech Slurred. Difficult to understand. ) Criminal Activity/Legal Involvement Pertinent to Current Situation/Hospitalization:  No - Comment as needed Significant Relationships:  Merchandiser, retailCommunity Support, Parents Lives with:  Facility Resident Do you feel safe going back to the place where you live?  Yes Need for family participation in patient care:  Yes (Comment)  Care giving concerns:  No care giving concerns at the time of assessment.    Social Worker assessment / plan:  LCSW following for facility placement. [facility] a [facility].   Patient admitted for UTI.  LCSW attempted to complete assessment with patient. Speech is slurred and difficult to understand.   [facility] and plan is to return to the facility.   According to facility patient uses a power wheel chair at baseline and requires assistance with ADLs. Facility reports that patient can walk with assistance.   Plan: Return to facility at dc.   Employment status:  Disabled (Comment on whether or not currently receiving Disability) Insurance information:  Medicare PT Recommendations:  Not assessed at this time Information / Referral to community resources:     Patient/Family's Response to care:  Unable to assess.   Patient/Family's  Understanding of and Emotional Response to Diagnosis, Current Treatment, and Prognosis:  Unable to assess.   Emotional Assessment Appearance:  Appears stated age Attitude/Demeanor/Rapport:    Affect (typically observed):  Calm Orientation:  Oriented to Self, Oriented to Place, Oriented to  Time, Oriented to Situation Alcohol / Substance use:  Not Applicable Psych involvement (Current and /or in the community):  No (Comment)  Discharge Needs  Concerns to be addressed:  No discharge needs identified Readmission within the last 30 days:  No Current discharge risk:  None Barriers to Discharge:  Continued Medical Work up   BJ'sBernette Skyelar Swigart, LCSW 05/28/2018, 3:28 PM

## 2018-05-28 NOTE — Progress Notes (Signed)
PROGRESS NOTE  [name] [name] ZOX:[id number] DOB: Feb 19, 1976 DOA: 05/25/2018 PCP: [facility] Axon  HPI/Recap of past 24 hours: Ms. Reimers is a 42 year old female with past medical history significant for cerebral palsy, hypertension, with recurrent PE, history of ESBL UTI, who presented with a fever.  Urine analysis positive.  Admitted for UTI.  05/27/2018: Patient seen and examined at her bedside.  She reports headache frontal not improved by Tylenol.  Started Fioricet.  She denies nausea or abdominal pain.  05/28/2018: Patient seen and examined at bedside.  She has no new complaints.  Assessment/Plan: Principal Problem:   UTI (urinary tract infection) Active Problems:   Fever   Cerebral palsy, quadriplegic ([name])   Pulmonary embolism with infarction ([name])   Headache  UTI in the setting of chronic suprapubic Catheter, ruled out Urine analysis 21-50 WBC in the setting of indwelling suprapubic catheter Urine culture multiple species present.  Repeated urine culture no growth DC meropenem Completed 3 days of IV antibiotics Exchange suprapubic catheter 05/27/2018 Monitor fever curve Monitor WBC Monitor urine output Obtain CBC in the morning  Intractable frontal headache Continue Fioricet  History of recurrent PE Continue Coumadin- Appreciate pharmacy management of Coumadin  Cerebral palsy Continue home medications  Hypertension Blood pressures well controlled Continue home medications   Code Status: Full code  Family Communication: None at bedside  Disposition Plan: SNF tomorrow if no events overnight.   Consultants:  None  Procedures:  Suprapubic catheter exchange on 05/27/2018  Antimicrobials:  None  DVT prophylaxis: Coumadin   Objective: Vitals:   05/27/18 1620 05/27/18 2031 05/28/18 0554 05/28/18 1426  BP: (!) 122/94 116/82 110/70 (!) 128/94  Pulse: 86 69 (!) 47 77  Resp: 20 16 17 20   Temp: 97.9 F (36.6 C) 98.5 F (36.9 C) (!) 97.5 F (36.4 C)  98.4 F (36.9 C)  TempSrc: Axillary Oral Oral Oral  SpO2: 97% 97% 98% 96%  Weight:      Height:        Intake/Output Summary (Last 24 hours) at 05/28/2018 1541 Last data filed at 05/28/2018 1500 Gross per 24 hour  Intake 2680 ml  Output 1800 ml  Net 880 ml   Filed Weights   05/25/18 1409 05/25/18 2206 05/27/18 0500  Weight: 48.5 kg (107 lb) 47.9 kg (105 lb 9.6 oz) 53.8 kg (118 lb 9.7 oz)    Exam:  . General: 42 y.o. year-old female well-developed well-nourished in no acute distress.  Alert and oriented x3.   . Cardiovascular: Regular rate and rhythm with no rubs or gallops.  No thyromegaly or JVD noted. [name] Kitchen Respiratory: Clear to auscultation with no wheezes or rales. Good inspiratory effort. . Abdomen: Soft nontender nondistended with normal bowel sounds x4 quadrants. . Musculoskeletal: TRace lower extremity edema. 2/4 pulses in all 4 extremities. [name] Kitchen Psychiatry: Mood is appropriate for condition and setting   Data Reviewed: CBC: Recent Labs  Lab 05/25/18 1622 05/26/18 0617 05/27/18 0613 05/28/18 0524  WBC 8.7 5.6 5.5 5.5  NEUTROABS 5.0  --   --   --   HGB 12.9 11.1* 11.1* 11.4*  HCT 39.8 34.1* 34.3* 34.9*  MCV 90.5 91.2 90.3 92.1  PLT 327 268 230 261   Basic Metabolic Panel: Recent Labs  Lab 05/25/18 1622 05/26/18 0617 05/28/18 0524  NA 140 139 141  K 4.3 3.9 4.8  CL 103 107 109  CO2 29 28 27   GLUCOSE 93 80 103*  BUN 12 12 10   CREATININE 0.52 0.57 0.53  CALCIUM 9.1  8.1* 8.5*   GFR: Estimated Creatinine Clearance: 65.3 mL/min (by C-G formula based on SCr of 0.53 mg/dL). Liver Function Tests: Recent Labs  Lab 05/25/18 1622 05/26/18 0617  AST 17 18  ALT 16 13  ALKPHOS 89 76  BILITOT 0.4 0.8  PROT 7.9 6.2*  ALBUMIN 4.1 3.2*   No results for input(s): LIPASE, AMYLASE in the last 168 hours. No results for input(s): AMMONIA in the last 168 hours. Coagulation Profile: Recent Labs  Lab 05/25/18 1622 05/26/18 0617 05/27/18 0613 05/28/18 0524  INR  1.68 1.79 2.11 2.80   Cardiac Enzymes: No results for input(s): CKTOTAL, CKMB, CKMBINDEX, TROPONINI in the last 168 hours. BNP (last 3 results) No results for input(s): PROBNP in the last 8760 hours. HbA1C: No results for input(s): HGBA1C in the last 72 hours. CBG: No results for input(s): GLUCAP in the last 168 hours. Lipid Profile: No results for input(s): CHOL, HDL, LDLCALC, TRIG, CHOLHDL, LDLDIRECT in the last 72 hours. Thyroid Function Tests: No results for input(s): TSH, T4TOTAL, FREET4, T3FREE, THYROIDAB in the last 72 hours. Anemia Panel: No results for input(s): VITAMINB12, FOLATE, FERRITIN, TIBC, IRON, RETICCTPCT in the last 72 hours. Urine analysis:    Component Value Date/Time   COLORURINE YELLOW 05/25/2018 1822   APPEARANCEUR CLEAR 05/25/2018 1822   LABSPEC 1.011 05/25/2018 1822   PHURINE 8.0 05/25/2018 1822   GLUCOSEU NEGATIVE 05/25/2018 1822   HGBUR NEGATIVE 05/25/2018 1822   HGBUR large 10/03/2010 1530   BILIRUBINUR NEGATIVE 05/25/2018 1822   BILIRUBINUR neg 03/15/2011 1719   KETONESUR NEGATIVE 05/25/2018 1822   PROTEINUR NEGATIVE 05/25/2018 1822   UROBILINOGEN 1.0 10/08/2015 2348   NITRITE POSITIVE (A) 05/25/2018 1822   LEUKOCYTESUR LARGE (A) 05/25/2018 1822   LEUKOCYTESUR mod 01/26/2014   Sepsis Labs: @LABRCNTIP (procalcitonin:4,lacticidven:4)  ) Recent Results (from the past 240 hour(s))  Blood Culture (routine x 2)     Status: None (Preliminary result)   Collection Time: 05/25/18  4:22 PM  Result Value Ref Range Status   Specimen Description   Final    BLOOD LEFT FOREARM Performed at [facility], [address] [place], [place] [address]    Special Requests   Final    BOTTLES DRAWN AEROBIC AND ANAEROBIC Blood Culture adequate volume Performed at [facility], [address], [place], [place] [address]    Culture   Final    NO GROWTH 3 DAYS Performed at [facility], [address] [place],  [place] [address]    Report Status PENDING  Incomplete  Urine culture     Status: Abnormal   Collection Time: 05/25/18  6:22 PM  Result Value Ref Range Status   Specimen Description   Final    URINE, RANDOM Performed at [facility], [address], [place], [place] [address]    Special Requests   Final    NONE Performed at [facility], [address], [place], [place] [address]    Culture MULTIPLE SPECIES PRESENT, SUGGEST RECOLLECTION (A)  Final   Report Status 05/27/2018 FINAL  Final  Blood Culture (routine x 2)     Status: None (Preliminary result)   Collection Time: 05/26/18  6:15 AM  Result Value Ref Range Status   Specimen Description   Final    BLOOD BLOOD LEFT HAND Performed at [facility], [address] [place], [place] [address]    Special Requests   Final    BOTTLES DRAWN AEROBIC  AND ANAEROBIC Blood Culture adequate volume Performed at [facility], [address] [place], [place] [address]    Culture   Final    NO GROWTH 2 DAYS Performed at [facility], [address] [place], [place] [address]    Report Status PENDING  Incomplete  Culture, Urine     Status: None   Collection Time: 05/27/18  1:59 PM  Result Value Ref Range Status   Specimen Description   Final    URINE, CLEAN CATCH Performed at [facility], [address] [place], [place] [address]    Special Requests   Final    NONE Performed at [name], [address] [place], [place] [address]    Culture   Final    NO GROWTH Performed at [facility], [address] [place], [place] [address]    Report Status 05/28/2018 FINAL  Final      Studies: No results found.  Scheduled Meds: . amantadine  100 mg Oral Daily  . baclofen  20 mg Oral QID  . busPIRone  5 mg Oral TID  . clonazePAM  0.75 mg Oral TID  . feeding supplement (ENSURE ENLIVE)  237 mL Oral BID BM  .  ferrous sulfate  325 mg Oral QPC breakfast  . FLUoxetine  80 mg Oral Daily  . lamoTRIgine  100 mg Oral QHS  . lubiprostone  24 mcg Oral BID WC  . Melatonin  5 mg Oral QHS  . multivitamin with minerals  1 tablet Oral Daily  . ondansetron  4 mg Oral Q6H  . oxybutynin  5 mg Oral QID  . polyethylene glycol  17 g Oral BID  . QUEtiapine  150 mg Oral QHS  . tiZANidine  4 mg Oral BID  . warfarin  4 mg Oral ONCE-1800  . Warfarin - Pharmacist Dosing Inpatient   Does not apply q1800    Continuous Infusions: . sodium chloride       LOS: 2 days     Darlin Droparole N Virjean Boman, MD [facility] Pager [phone]  If 7PM-7AM, please contact night-coverage [email or web address] Password [email or web address] 05/28/2018, 3:41 PM

## 2018-05-29 LAB — CBC
HCT: 36.4 % (ref 36.0–46.0)
HEMOGLOBIN: 11.6 g/dL — AB (ref 12.0–15.0)
MCH: 29 pg (ref 26.0–34.0)
MCHC: 31.9 g/dL (ref 30.0–36.0)
MCV: 91 fL (ref 78.0–100.0)
Platelets: 290 10*3/uL (ref 150–400)
RBC: 4 MIL/uL (ref 3.87–5.11)
RDW: 15.9 % — AB (ref 11.5–15.5)
WBC: 6.1 10*3/uL (ref 4.0–10.5)

## 2018-05-29 LAB — BASIC METABOLIC PANEL
Anion gap: 6 (ref 5–15)
BUN: 10 mg/dL (ref 6–20)
CALCIUM: 8.6 mg/dL — AB (ref 8.9–10.3)
CHLORIDE: 110 mmol/L (ref 98–111)
CO2: 28 mmol/L (ref 22–32)
CREATININE: 0.45 mg/dL (ref 0.44–1.00)
GFR calc Af Amer: >60 mL/min (ref >60)
GFR calc non Af Amer: >60 mL/min (ref >60)
Glucose, Bld: 80 mg/dL (ref 70–99)
Potassium: 4 mmol/L (ref 3.5–5.1)
SODIUM: 144 mmol/L (ref 135–145)

## 2018-05-29 LAB — PROTIME-INR
INR: 3.31
PROTHROMBIN TIME: 33.4 s — AB (ref 11.4–15.2)

## 2018-05-29 MED ORDER — LORAZEPAM 2 MG/ML IJ SOLN: 1.0000 mg | Freq: Once | INTRAMUSCULAR | Status: DC

## 2018-05-29 MED ORDER — BUTALBITAL-APAP-CAFFEINE 50-325-40 MG PO TABS: 2.0000 | ORAL_TABLET | ORAL | 0 refills | Status: DC | PRN

## 2018-05-29 MED ORDER — ENSURE ENLIVE PO LIQD: 237.0000 mL | Freq: Two times a day (BID) | ORAL | 0 refills | Status: AC

## 2018-05-29 NOTE — Progress Notes (Addendum)
RN called [facility] to give a report at 1430 to [employer]N Julian. No question at this time. [facility]. Dressing change (suprapubic catheter) before leaving.

## 2018-05-29 NOTE — [place] FL2 (Signed)
MEDICAID FL2 LEVEL OF CARE SCREENING TOOL     IDENTIFICATION  Patient Name: [name] SlickerJudith [name] Birthdate: 04-03-1976 Sex: female Admission Date (Current Location): 05/25/2018  [facility] [place]   Facility and Address:  [facility],  [address], [place] [address]      Provider Number: [id number]  Attending Physician Name and Address:  Darlin DropHall, Carole N, DO  Relative Name and Phone Number:       Current Level of Care: Hospital Recommended Level of Care: Skilled Nursing Facility Prior Approval Number:    Date Approved/Denied:   PASRR Number:    Discharge Plan: SNF    Current Diagnoses: Patient Active Problem List   Diagnosis Date Noted  . UTI (urinary tract infection) 05/25/2018  . Headache 05/25/2018  . Incontinent of feces 03/11/2016  . Chronic anticoagulation 03/11/2016  . Involuntary movements 02/22/2016  . Seizures ([name]) 02/12/2016  . Dyspnea 02/12/2016  . Cerebral palsy, quadriplegic ([name]) 02/06/2016  . Swelling of extremity 01/13/2016  . Fever   . Pressure ulcer 10/09/2015  . Status post insertion of inferior vena caval filter 09/28/2015  . Absence of bladder continence 08/25/2015  . Person living in residential institution 06/14/2015  . Pulmonary hypertension ([name])   . Anemia of chronic disease   . Spasticity 03/15/2015  . Constipation, chronic 10/10/2014  . Athetoid cerebral palsy ([name]) 04/13/2014  . Dystonia 04/12/2014  . UTI (lower urinary tract infection) 03/16/2014  . Presence of intrathecal baclofen pump 03/07/2014  . Dental caries 07/18/2013  . Multiple thyroid nodules 05/20/2013  . Osteoarthrosis 04/07/2013  . [name]   . Dysarthria   . GERD (gastroesophageal reflux disease)   . Neurogenic bladder 02/05/2013  . Anxiety state 10/21/2012  . Chronic pain syndrome 06/17/2012  . Recurrent pulmonary embolism ([name]) 05/03/2011  . Pulmonary embolism with infarction ([name]) 05/03/2011   . Chronic suprapubic catheter ([name]) 03/15/2011  . Disease of female genital organs 10/24/2010  . HYPERTENSION, BENIGN 01/31/2010  . CP (cerebral palsy), spastic ([name]) 12/27/2008  . Contracted, joint, multiple sites 12/27/2008  . Major depressive disorder, recurrent episode ([name]) 01/29/2007  . Obsessive Compulsive Disorder 01/29/2007  . Transient alteration of awareness, recurrent 09/08/2006    Orientation RESPIRATION BLADDER Height & Weight     Self, Situation, Place  Normal Incontinent, Indwelling catheter Weight: 119 lb 0.8 oz (54 kg) Height:  4\' 10"  (147.3 cm)  BEHAVIORAL SYMPTOMS/MOOD NEUROLOGICAL BOWEL NUTRITION STATUS      Continent Diet(See dc summary)  AMBULATORY STATUS COMMUNICATION OF NEEDS Skin   Extensive Assist Verbally Normal                       Personal Care Assistance Level of Assistance  Bathing, Feeding, Dressing Bathing Assistance: Maximum assistance Feeding assistance: Limited assistance Dressing Assistance: Maximum assistance     Functional Limitations Info  Sight, Hearing, Speech Sight Info: Adequate Hearing Info: Adequate Speech Info: Impaired(Slurred Speech)    SPECIAL CARE FACTORS FREQUENCY                       Contractures Contractures Info: Not present    Additional Factors Info  Code Status, Allergies Code Status Info: Full Allergies Info: Morphine           Current Medications (05/29/2018):  This is the current hospital active medication list Current Facility-Administered Medications  Medication Dose Route Frequency Provider Last Rate Last Dose  . 0.9 %  sodium chloride infusion  1,000 mL Intravenous Continuous Darlin Drop, DO 75 mL/hr at 05/28/18 1544    . acetaminophen (TYLENOL) tablet 1,000 mg  1,000 mg Oral Q6H PRN Penny Pia, MD   1,000 mg at 05/27/18 1019  . amantadine (SYMMETREL) capsule 100 mg  100 mg Oral Daily Pearson Grippe, MD   100 mg at 05/29/18 0949  . antiseptic oral rinse (BIOTENE) solution 15 mL   15 mL Mouth Rinse PRN Pearson Grippe, MD      . baclofen (LIORESAL) tablet 20 mg  20 mg Oral QID Pearson Grippe, MD   20 mg at 05/29/18 0951  . bisacodyl (DULCOLAX) suppository 10 mg  10 mg Rectal PRN Pearson Grippe, MD      . busPIRone (BUSPAR) tablet 5 mg  5 mg Oral TID Pearson Grippe, MD   5 mg at 05/29/18 0950  . butalbital-acetaminophen-caffeine (FIORICET, ESGIC) 50-325-40 MG per tablet 2 tablet  2 tablet Oral Q4H PRN Dow Adolph N, DO   2 tablet at 05/29/18 0949  . clonazePAM (KLONOPIN) tablet 0.75 mg  0.75 mg Oral TID Pearson Grippe, MD   0.75 mg at 05/29/18 0950  . feeding supplement (ENSURE ENLIVE) (ENSURE ENLIVE) liquid 237 mL  237 mL Oral BID BM Penny Pia, MD   237 mL at 05/28/18 1021  . ferrous sulfate tablet 325 mg  325 mg Oral QPC breakfast Pearson Grippe, MD   325 mg at 05/29/18 0951  . FLUoxetine (PROZAC) capsule 80 mg  80 mg Oral Daily Pearson Grippe, MD   80 mg at 05/29/18 0950  . lactulose (CHRONULAC) 10 GM/15ML solution 20 g  20 g Oral Daily PRN Pearson Grippe, MD      . lamoTRIgine (LAMICTAL) tablet 100 mg  100 mg Oral Farrel Demark, MD   100 mg at 05/28/18 2248  . lubiprostone (AMITIZA) capsule 24 mcg  24 mcg Oral BID WC Pearson Grippe, MD   24 mcg at 05/29/18 0800  . Melatonin TABS 5 mg  5 mg Oral QHS Pearson Grippe, MD   5 mg at 05/28/18 2250  . multivitamin with minerals tablet 1 tablet  1 tablet Oral Daily Penny Pia, MD   1 tablet at 05/29/18 0949  . ondansetron (ZOFRAN) tablet 4 mg  4 mg Oral Q6H Pearson Grippe, MD   4 mg at 05/28/18 1721  . oxybutynin (DITROPAN) tablet 5 mg  5 mg Oral QID Pearson Grippe, MD   5 mg at 05/29/18 0951  . oxyCODONE (Oxy IR/ROXICODONE) immediate release tablet 5 mg  5 mg Oral Q4H PRN Pearson Grippe, MD   5 mg at 05/29/18 0951  . polyethylene glycol (MIRALAX / GLYCOLAX) packet 17 g  17 g Oral BID Pearson Grippe, MD   17 g at 05/29/18 0951  . QUEtiapine (SEROQUEL) tablet 150 mg  150 mg Oral Farrel Demark, MD   150 mg at 05/28/18 2248  . tiZANidine (ZANAFLEX) tablet 4 mg  4 mg Oral BID  Pearson Grippe, MD   4 mg at 05/29/18 0730  . Warfarin - Pharmacist Dosing Inpatient   Does not apply q1800 Donell Beers, [facility]   Stopped at 05/29/18 1800     Discharge Medications: Please see discharge summary for a list of discharge medications.  Relevant Imaging Results:  Relevant Lab Results:   Additional Information SS#: [id number]  Coralyn Helling, LCSW

## 2018-05-29 NOTE — Progress Notes (Signed)
[facility] to [facility].  LCSW confirmed return with facility.  LCSW faxed dc docs to facility.   Patient will transport by PTAR. [facility] arranged for 3:00.  RN report #: [id number] [phone]  Coralyn HellingBernette Pius Byrom, Roosvelt HarpsLSCW [facility] CSW [phone]

## 2018-05-29 NOTE — Progress Notes (Signed)
ANTICOAGULATION CONSULT NOTE  Pharmacy Consult for Warfarin Indication: pulmonary embolus  Allergies  Allergen Reactions  . Morphine Dermatitis and Other (See Comments)    Skin turned red   Patient Measurements: Height: 4\' 10"  (147.3 cm) Weight: 119 lb 0.8 oz (54 kg) IBW/kg (Calculated) : 40.9  Vital Signs: Temp: 98 F (36.7 C) (06/28 0421) Temp Source: Oral (06/28 0421) BP: 136/80 (06/28 0421) Pulse Rate: 63 (06/28 0421)  Labs: Recent Labs    05/27/18 [id number] 05/28/18 0524 05/29/18 0534  HGB 11.1* 11.4* 11.6*  HCT 34.3* 34.9* 36.4  PLT 230 261 290  LABPROT 23.4* 29.2* 33.4*  INR 2.11 2.80 3.31  CREATININE  --  0.53 0.45   Estimated Creatinine Clearance: 65.3 mL/min (by C-G formula based on SCr of 0.45 mg/dL).  Assessment: 42 y/o F [facility] with c/o headache. PMH signficant for recurrent PE on chronic warfarin therapy, cerebral palsy, HTN, migraine, seizures, ESBL UTI. Pharmacy consulted to dose warfarin while patient in the hospital. PTA dose of warfarin reported as 9mg  PO daily, with [facility] at 1700. Admission INR subtherapeutic at 1.68.   Today, 05/29/18:  INR 3.31, increased further, now above therapeutic range  Warfarin resumed 6/24 with 9mg  dose, followed by 12mg , 9mg , 4mg   CBC: Hgb 11.4, unchanged. Pltc WNL  No bleeding issues per nursing  Goal of Therapy:  INR 2-3   Plan:   No Warfarin today  Daily PT/INR  Monitor closely for s/sx of bleeding  Otho BellowsGreen, Brentlee Delage L PharmD Pager [phone] 05/29/2018, 7:46 AM

## 2018-05-29 NOTE — Discharge Summary (Signed)
Discharge Summary  [name] [name] ZOX:[id number] DOB: 1976/08/11  PCP: Lacinda Axon  Admit date: 05/25/2018 Discharge date: 05/29/2018  Time spent: 25 minutes  Recommendations for Outpatient Follow-up:  1. Follow-up with PCP 2. Take your medications as prescribed 3. Fall precautions 4. Repeat INR level on 05/30/2018  Discharge Diagnoses:  Active Hospital Problems   Diagnosis Date Noted  . UTI (urinary tract infection) 05/25/2018  . Headache 05/25/2018  . Cerebral palsy, quadriplegic ([name]) 02/06/2016  . Fever   . Pulmonary embolism with infarction [facility]) 05/03/2011    Resolved Hospital Problems  No resolved problems to display.    Discharge Condition: Stable  Diet recommendation: Resume previous diet  Vitals:   05/28/18 1426 05/29/18 0421  BP: (!) 128/94 136/80  Pulse: 77 63  Resp: 20 16  Temp: 98.4 F (36.9 C) 98 F (36.7 C)  SpO2: 96% 96%    History of present illness:   [name] [name] is a 42 year old female with past medical history significant for cerebral palsy, hypertension, with recurrent PE, history of ESBL UTI, who presented with a fever.  Urine analysis positive.  Admitted for UTI.  Completed 3 days of IV antibiotics.  Suprapubic catheter exchanged on 05/27/2018.  Intermittent headaches which resolved on the day of discharge.  05/29/2018: Patient seen and examined at her bedside.  She has no new complaints.  She denies any headaches, chest pain, dyspnea or palpitations.  INR elevated at 3.3 with no sign of overt bleeding.  Will hold Coumadin today, will repeat INR tomorrow and will resume when NR is therapeutic.  Patient would need to follow-up with her primary care provider within a week.    Hospital Course:  Principal Problem:   UTI (urinary tract infection) Active Problems:   Fever   Cerebral palsy, quadriplegic ([name])   Pulmonary embolism with infarction ([name])   Headache  Pyuria in the setting of chronic suprapubic Catheter Afebrile for the past 48  hours with T-max of 99.3 degrees during this admission. No leukocytosis Normal lactic acid Urine analysis 21-50 WBC in the setting of indwelling suprapubic catheter Urine culture done on admission multiple species present.  Repeated urine culture no growth Completed 3 days of IV antibiotics Exchanged suprapubic catheter on 05/27/2018  Dehydration, resolved Suspect secondary to poor oral intake Improved after continuous IV hydration during this hospitalization  Frontal headache resolved Continue Fioricet as needed  History of recurrent PE Hold Coumadin due to supra therapeutic INR INR 3.3 Repeat INR tomorrow at SNF Resume Coumadin when INR is therapeutic Follow-up with PCP outpatient  Cerebral palsy Continue home medications  Hypertension Blood pressures well controlled Continue home medications    Procedures:  Suprapubic catheter exchange on 05/27/2018  Consultations:  None  Discharge Exam: BP 136/80 (BP Location: Left Arm)   Pulse 63   Temp 98 F (36.7 C) (Oral)   Resp 16   Ht 4\' 10"  (1.473 m)   Wt 54 kg (119 lb 0.8 oz)   LMP 04/05/2011   SpO2 96%   BMI 24.88 kg/m  . General: 42 y.o. year-old female well developed well nourished in no acute distress.  Alert and oriented x3. . Cardiovascular: Regular rate and rhythm with no rubs or gallops.  No thyromegaly or JVD noted.   [name] Kitchen Respiratory: Clear to auscultation with no wheezes or rales. Good inspiratory effort. . Abdomen: Soft nontender nondistended with normal bowel sounds x4 quadrants. . Psychiatry: Mood is appropriate for condition and setting  Discharge Instructions You were cared for by a  hospitalist during your hospital stay. If you have any questions about your discharge medications or the care you received while you were in the hospital after you are discharged, you can call the unit and asked to speak with the hospitalist on call if the hospitalist that took care of you is not available. Once you are  discharged, your primary care physician will handle any further medical issues. Please note that NO REFILLS for any discharge medications will be authorized once you are discharged, as it is imperative that you return to your primary care physician (or establish a relationship with a primary care physician if you do not have one) for your aftercare needs so that they can reassess your need for medications and monitor your lab values.   Allergies as of 05/29/2018      Reactions   Morphine Dermatitis, Other (See Comments)   Skin turned red      Medication List    STOP taking these medications   acetaminophen 325 MG tablet Commonly known as:  TYLENOL   acetaminophen 500 MG tablet Commonly known as:  TYLENOL   BELBUCA 150 MCG Film Generic drug:  Buprenorphine HCl   ondansetron 4 MG tablet Commonly known as:  ZOFRAN   oxyCODONE 5 MG immediate release tablet Commonly known as:  Oxy IR/ROXICODONE   warfarin 6 MG tablet Commonly known as:  COUMADIN     TAKE these medications   amantadine 100 MG capsule Commonly known as:  SYMMETREL Take 100 mg by mouth daily.   antiseptic oral rinse Liqd 15 mLs by Mouth Rinse route as needed for dry mouth.   baclofen 20 MG tablet Commonly known as:  LIORESAL Take 20 mg by mouth 4 (four) times daily. For bladder spasms   bisacodyl 10 MG suppository Commonly known as:  DULCOLAX Place 10 mg rectally as needed (For constipation.).   busPIRone 5 MG tablet Commonly known as:  BUSPAR Take 5 mg by mouth 3 (three) times daily.   butalbital-acetaminophen-caffeine 50-325-40 MG tablet Commonly known as:  FIORICET, ESGIC Take 2 tablets by mouth every 4 (four) hours as needed for headache (not controlled by Tylenol).   CERTA-VITE PO Take 1 tablet by mouth daily.   clonazePAM 0.5 MG tablet Commonly known as:  KLONOPIN Take 1.5 tablets (0.75 mg total) by mouth 3 (three) times daily.   ENULOSE 10 GM/15ML Soln Generic drug:  lactulose  (encephalopathy) Take 20 g by mouth daily as needed (For constipation.).   feeding supplement (ENSURE ENLIVE) Liqd Take 237 mLs by mouth 2 (two) times daily between meals. Start taking on:  05/30/2018   ferrous sulfate 325 (65 FE) MG tablet Take 325 mg by mouth daily.   FLUoxetine 40 MG capsule Commonly known as:  PROZAC Take 80 mg by mouth daily.   lamoTRIgine 100 MG tablet Commonly known as:  LAMICTAL Take 100 mg by mouth at bedtime.   lubiprostone 24 MCG capsule Commonly known as:  AMITIZA Take 1 capsule (24 mcg total) by mouth 2 (two) times daily with a meal.   Melatonin 5 MG Tabs Take 5 mg by mouth at bedtime.   OVER THE COUNTER MEDICATION Take 1 Package by mouth 3 (three) times daily with meals. Magic cup   oxybutynin 5 MG tablet Commonly known as:  DITROPAN Take 5 mg by mouth 4 (four) times daily.   polyethylene glycol powder powder Commonly known as:  GLYCOLAX/MIRALAX Take 1  1/2 dose  daily in 12 ounces of fluid every day.  What changed:    how much to take  how to take this  when to take this  additional instructions   QUEtiapine 50 MG tablet Commonly known as:  SEROQUEL Take 150 mg by mouth at bedtime.   tiZANidine 4 MG capsule Commonly known as:  ZANAFLEX Take 4 mg by mouth 2 (two) times daily.      Allergies  Allergen Reactions  . Morphine Dermatitis and Other (See Comments)    Skin turned red   Follow-up Information    [facility]. Call in 1 day(s).   Specialty:  Skilled Nursing Facility Why:  Please call for appointment Contact information: [address] [place] [address] [phone]            The results of significant diagnostics from this hospitalization (including imaging, microbiology, ancillary and laboratory) are listed below for reference.    Significant Diagnostic Studies: Ct Head Wo Contrast  Result Date: 05/25/2018 CLINICAL DATA:  Headache for 3 days. EXAM: CT HEAD WITHOUT CONTRAST TECHNIQUE: Contiguous  axial images were obtained from the base of the skull through the vertex without intravenous contrast. COMPARISON:  07/17/2011 FINDINGS: Brain: Sylvian fissure is somewhat effaced with respect to that of the left. This is stable compared to the prior study. There is no definitive mass effect, midline shift, or acute hemorrhage. There is no evidence of hydrocephalus. Vascular: No hyperdense vessel or unexpected calcification. Skull: Cranium is intact. Sinuses/Orbits: Mastoid air cells are clear. Visualized paranasal sinuses are clear. Orbits are within normal limits. Other: Noncontributory. IMPRESSION: No acute intracranial pathology. Electronically Signed   By: Jolaine Click M.D.   On: 05/25/2018 18:18   Dg Chest Port 1 View  Result Date: 05/25/2018 CLINICAL DATA:  Headaches and fever.  Shortness of breath. EXAM: PORTABLE CHEST 1 VIEW COMPARISON:  Chest radiograph 11/24/2017 FINDINGS: Shallow lung inflation without focal airspace consolidation. No pleural effusion. Distended bowel is again seen in left upper quadrant. IMPRESSION: Shallow lung inflation without focal airspace disease. Electronically Signed   By: Deatra Robinson M.D.   On: 05/25/2018 17:29    Microbiology: Recent Results (from the past 240 hour(s))  Blood Culture (routine x 2)     Status: None (Preliminary result)   Collection Time: 05/25/18  4:22 PM  Result Value Ref Range Status   Specimen Description   Final    BLOOD LEFT FOREARM Performed at [facility], [address], [place], [place] [address]    Special Requests   Final    BOTTLES DRAWN AEROBIC AND ANAEROBIC Blood Culture adequate volume Performed at [facility], [address] [place], [place] [address]    Culture   Final    NO GROWTH 4 DAYS Performed at [facility], [address], [place], [place] [address]    Report Status PENDING  Incomplete  Urine culture     Status: Abnormal   Collection Time: 05/25/18  6:22 PM    Result Value Ref Range Status   Specimen Description   Final    URINE, RANDOM Performed at [facility], [address] [place], [place] [address]    Special Requests   Final    NONE Performed at [facility], [address], [place], [place] [address]    Culture MULTIPLE SPECIES PRESENT, SUGGEST RECOLLECTION (A)  Final   Report Status 05/27/2018 FINAL  Final  Blood Culture (routine x 2)     Status: None (Preliminary result)   Collection  Time: 05/26/18  6:15 AM  Result Value Ref Range Status   Specimen Description   Final    BLOOD LEFT HAND Performed at [facility], [address] [place], [place] [address]    Special Requests   Final    BOTTLES DRAWN AEROBIC AND ANAEROBIC Blood Culture adequate volume Performed at [facility], [address] [place], [place] [address]    Culture   Final    NO GROWTH 3 DAYS Performed at [facility], [address] [place], [place] [address]    Report Status PENDING  Incomplete  Culture, Urine     Status: None   Collection Time: 05/27/18  1:59 PM  Result Value Ref Range Status   Specimen Description   Final    URINE, CLEAN CATCH Performed at [facility], [address] [place], [place] [address]    Special Requests   Final    NONE Performed at [facility], [address] [place], [place] [address]    Culture   Final    NO GROWTH Performed at [facility], [address] [place], [place] [address]    Report Status 05/28/2018 FINAL  Final     Labs: Basic Metabolic Panel: Recent Labs  Lab 05/25/18 1622 05/26/18 0617 05/28/18 0524 05/29/18 0534  NA 140 139 141 144  K 4.3 3.9 4.8 4.0  CL 103 107 109 110  CO2 29 28 27 28   GLUCOSE 93 80 103* 80  BUN 12 12 10 10   CREATININE 0.52 0.57 0.53 0.45  CALCIUM 9.1 8.1* 8.5* 8.6*   Liver Function Tests: Recent Labs  Lab 05/25/18 1622 05/26/18 0617  AST 17 18   ALT 16 13  ALKPHOS 89 76  BILITOT 0.4 0.8  PROT 7.9 6.2*  ALBUMIN 4.1 3.2*   No results for input(s): LIPASE, AMYLASE in the last 168 hours. No results for input(s): AMMONIA in the last 168 hours. CBC: Recent Labs  Lab 05/25/18 1622 05/26/18 0617 05/27/18 0613 05/28/18 0524 05/29/18 0534  WBC 8.7 5.6 5.5 5.5 6.1  NEUTROABS 5.0  --   --   --   --   HGB 12.9 11.1* 11.1* 11.4* 11.6*  HCT 39.8 34.1* 34.3* 34.9* 36.4  MCV 90.5 91.2 90.3 92.1 91.0  PLT 327 268 230 261 290   Cardiac Enzymes: No results for input(s): CKTOTAL, CKMB, CKMBINDEX, TROPONINI in the last 168 hours. BNP: BNP (last 3 results) No results for input(s): BNP in the last 8760 hours.  ProBNP (last 3 results) No results for input(s): PROBNP in the last 8760 hours.  CBG: No results for input(s): GLUCAP in the last 168 hours.     Signed:  Darlin Droparole N Hall, MD [facility] 05/29/2018, 2:08 PM

## 2018-05-30 LAB — CULTURE, BLOOD (ROUTINE X 2)
Culture: NO GROWTH
Special Requests: ADEQUATE

## 2018-05-31 DIAGNOSIS — I4891 Unspecified atrial fibrillation: Secondary | ICD-10-CM | POA: Diagnosis not present

## 2018-05-31 LAB — CULTURE, BLOOD (ROUTINE X 2)
Culture: NO GROWTH
Special Requests: ADEQUATE

## 2018-06-01 DIAGNOSIS — G8929 Other chronic pain: Secondary | ICD-10-CM | POA: Diagnosis not present

## 2018-06-01 DIAGNOSIS — I82402 Acute embolism and thrombosis of unspecified deep veins of left lower extremity: Secondary | ICD-10-CM | POA: Diagnosis not present

## 2018-06-01 DIAGNOSIS — I2699 Other pulmonary embolism without acute cor pulmonale: Secondary | ICD-10-CM | POA: Diagnosis not present

## 2018-06-01 DIAGNOSIS — N318 Other neuromuscular dysfunction of bladder: Secondary | ICD-10-CM | POA: Diagnosis not present

## 2018-06-01 DIAGNOSIS — R51 Headache: Secondary | ICD-10-CM | POA: Diagnosis not present

## 2018-06-02 DIAGNOSIS — F4322 Adjustment disorder with anxiety: Secondary | ICD-10-CM | POA: Diagnosis not present

## 2018-06-03 DIAGNOSIS — I484 Atypical atrial flutter: Secondary | ICD-10-CM | POA: Diagnosis not present

## 2018-06-05 DIAGNOSIS — I82402 Acute embolism and thrombosis of unspecified deep veins of left lower extremity: Secondary | ICD-10-CM | POA: Diagnosis not present

## 2018-06-08 DIAGNOSIS — I803 Phlebitis and thrombophlebitis of lower extremities, unspecified: Secondary | ICD-10-CM | POA: Diagnosis not present

## 2018-06-08 DIAGNOSIS — I80202 Phlebitis and thrombophlebitis of unspecified deep vessels of left lower extremity: Secondary | ICD-10-CM | POA: Diagnosis not present

## 2018-06-08 DIAGNOSIS — G809 Cerebral palsy, unspecified: Secondary | ICD-10-CM | POA: Diagnosis not present

## 2018-06-08 DIAGNOSIS — Z466 Encounter for fitting and adjustment of urinary device: Secondary | ICD-10-CM | POA: Diagnosis not present

## 2018-06-08 DIAGNOSIS — L603 Nail dystrophy: Secondary | ICD-10-CM | POA: Diagnosis not present

## 2018-06-08 DIAGNOSIS — N319 Neuromuscular dysfunction of bladder, unspecified: Secondary | ICD-10-CM | POA: Diagnosis not present

## 2018-06-08 DIAGNOSIS — Z9359 Other cystostomy status: Secondary | ICD-10-CM | POA: Diagnosis not present

## 2018-06-10 DIAGNOSIS — N390 Urinary tract infection, site not specified: Secondary | ICD-10-CM | POA: Diagnosis not present

## 2018-06-10 DIAGNOSIS — R51 Headache: Secondary | ICD-10-CM | POA: Diagnosis not present

## 2018-06-10 DIAGNOSIS — M6281 Muscle weakness (generalized): Secondary | ICD-10-CM | POA: Diagnosis not present

## 2018-06-10 DIAGNOSIS — Z9359 Other cystostomy status: Secondary | ICD-10-CM | POA: Diagnosis not present

## 2018-06-10 DIAGNOSIS — M4322 Fusion of spine, cervical region: Secondary | ICD-10-CM | POA: Diagnosis not present

## 2018-06-10 DIAGNOSIS — M542 Cervicalgia: Secondary | ICD-10-CM | POA: Diagnosis not present

## 2018-06-11 ENCOUNTER — Other Ambulatory Visit: Payer: Self-pay

## 2018-06-11 ENCOUNTER — Emergency Department (HOSPITAL_COMMUNITY)
Admission: EM | Admit: 2018-06-11 | Discharge: 2018-06-12 | Disposition: A | Discharge status: Home / Self Care | Payer: Medicare Other | Attending: Emergency Medicine | Admitting: Emergency Medicine

## 2018-06-11 ENCOUNTER — Encounter (HOSPITAL_COMMUNITY): Payer: Self-pay

## 2018-06-11 DIAGNOSIS — Z862 Personal history of diseases of the blood and blood-forming organs and certain disorders involving the immune mechanism: Secondary | ICD-10-CM | POA: Diagnosis not present

## 2018-06-11 DIAGNOSIS — F329 Major depressive disorder, single episode, unspecified: Secondary | ICD-10-CM | POA: Insufficient documentation

## 2018-06-11 DIAGNOSIS — G809 Cerebral palsy, unspecified: Secondary | ICD-10-CM | POA: Diagnosis not present

## 2018-06-11 DIAGNOSIS — G4489 Other headache syndrome: Secondary | ICD-10-CM | POA: Diagnosis not present

## 2018-06-11 DIAGNOSIS — R51 Headache: Secondary | ICD-10-CM | POA: Diagnosis not present

## 2018-06-11 DIAGNOSIS — I10 Essential (primary) hypertension: Secondary | ICD-10-CM | POA: Diagnosis not present

## 2018-06-11 DIAGNOSIS — Z79899 Other long term (current) drug therapy: Secondary | ICD-10-CM | POA: Diagnosis not present

## 2018-06-11 DIAGNOSIS — R4781 Slurred speech: Secondary | ICD-10-CM | POA: Diagnosis not present

## 2018-06-11 DIAGNOSIS — Z789 Other specified health status: Secondary | ICD-10-CM | POA: Insufficient documentation

## 2018-06-11 DIAGNOSIS — M542 Cervicalgia: Secondary | ICD-10-CM | POA: Diagnosis not present

## 2018-06-11 DIAGNOSIS — Z9049 Acquired absence of other specified parts of digestive tract: Secondary | ICD-10-CM | POA: Diagnosis not present

## 2018-06-11 DIAGNOSIS — M4322 Fusion of spine, cervical region: Secondary | ICD-10-CM | POA: Diagnosis not present

## 2018-06-11 DIAGNOSIS — G43409 Hemiplegic migraine, not intractable, without status migrainosus: Secondary | ICD-10-CM | POA: Insufficient documentation

## 2018-06-11 DIAGNOSIS — M6281 Muscle weakness (generalized): Secondary | ICD-10-CM | POA: Diagnosis not present

## 2018-06-11 NOTE — ED Notes (Signed)
Bed: WA03 Expected date:  Expected time:  Means of arrival:  Comments: 42 yr old headache

## 2018-06-11 NOTE — ED Triage Notes (Signed)
[facility] BIB EMS [facility] for [facility]. Patient has a history of migraines and also has CP. Patient received migraine medication and zofran at facility at about 2030. Pt denies blurred vision, dizziness.

## 2018-06-11 NOTE — ED Provider Notes (Signed)
[facility] DEPT Provider Note: Lowella Dell, MD, FACEP  CSN: [id number] MRN: [id number] ARRIVAL: 06/11/18 at 2326 ROOM: WA03/WA03   CHIEF COMPLAINT  Headache   HISTORY OF PRESENT ILLNESS  06/11/18 11:48 PM [name] [name] is a 42 y.o. female with history of cerebral palsy and migraines.  She is here with a headache this evening that she describes as severe and pounding.  It is located frontally.  Is consistent with previous migraines.  It is associated with nausea and photophobia.  She has been given several unspecified medication (Fiorcet?) and Zofran at her facility without relief.  She denies visual changes.  She was admitted on June 28 with a diagnosis of pulmonary embolism.  She was supratherapeutic on her Coumadin.  Her discharge summary indicates her Coumadin was held pending normalization of INR.  It is unclear if her Coumadin was restarted.  Past Medical History:  Diagnosis Date  . Abdominal pain 01/02/2012   Overview:  Overview:  Per Previous pcp note- Dr. Ta--Pt has history of endometriosis and had DUB in 2011.  She She had u/s that showed normal endometrial stripe.  She had endometrial bx that was wnl.  She has a lot of abd cramping every month.  This cramping was sometimes not relieved with hydrocodone.  She was referred to [name] for IUD placement to help endometriosis and abd cramping.  [name]  . Abdominal pain 01/02/2012   Overview:  Overview:  Per Previous pcp note- Dr. Ta--Pt has history of endometriosis and had DUB in 2011.  She She had u/s that showed normal endometrial stripe.  She had endometrial bx that was wnl.  She has a lot of abd cramping every month.  This cramping was sometimes not relieved with hydrocodone.  She was referred to [facility] for IUD placement to help endometriosis and abd cramping.  [name] was placed 04/30/11 by [facility].  Pt still complains of abd cramping, which I am treating with hydrocodone and ultram. Pt reports some initial relief of pain  from IUD, but states it has now returned back to similar to previous cramping. Gave patient a shot of Toradol 60 mg IM today-since positive exacerbation of abdominal cramping during past 2 weeks. Physical exam reassuring. Patient may need to return [facility] for further workup since she is plugged in with the GYN providers there for further treatment options regarding endometriosis and abdominal cramping. The dysfunctional uterine bleeding now well contr  . Acute cystitis with hematuria   . Acute cystitis without hematuria   . Acute respiratory failure with hypoxia ([name])   . Anemia   . Cellulitis 12/22/2014  . Cerebral palsy ([name])    Spastic Cerebral palsy, mentally intact  . Contracture, joint, multiple sites    Electric wheelchair, uses left hand to operate chair.   . Depression   . Disease of female genital organs 10/24/2010   Overview:  Overview:  Pt has history of endometriosis and had DUB in 2011.  She had u/s that showed normal endometrial stripe.  She had endometrial bx that was wnl.  She has a lot of abd cramping every month.  This cramping was sometimes not relieved with hydrocodone.  She was referred to [facility] for IUD placement to help endometriosis and abd cramping.  [name] was placed 04/30/11 by [facility].  Pt still complains of abd cramping, which was treated with hydrocodone and ultram.   Last seen at [facility] by OB/GYN 12/24/11, given Depo Lupron injection.  Ordering CT scan to  r/o additional cause of abdominal pain (pt with known endometriosis and will not tolerate vaginal U/S, pain not improved s/p [name] or with Lupron injection-- only caused hot flashes and amenorrhea, no pain relief).  Also made referral to pain clinic for better pain control.  Do not think hysterectomy would help with pain, and concern for contamination of baclofen pump if did proceed with surgery.    8/5/13[name] OB/GYN: Pt was seen by   . [name]   . Dysarthria   . Dyspnea and  respiratory abnormality 02/04/2011   Overview:  Overview:  Pt has history of recurrent PE and is on chronic coumadin.  Pt has been complaining of dyspnea that she describes as chest tightness.  She has been to the ER multiple times for this.  Usually she gets CTA and serial CE.  She has had at least 6 CTA in a period of since 2011.  She also has an O2 requirement. It is unclear why pt feels dyspneic, complains of chest tightness, or has O2 requirement.  I thought that her complaint may be from deconditioning and referred her to PT.  Unfortunately Medicaid only pays for 4 PT visits.  Pt should be doing these exercises at home, but she has not.  I have referred her to [facility], and she has been seen by [name].  He did not understand the etiology of her dyspnea and O2 requirement.  He will continue to follow her.   Last Assessment & Plan:  Pt presents with concerns for chest tightness and dyspnea.  She was seen in ED yesterday and discharged home.  While there she had a CT chest 04/20/11 showed 1.  Multifocal degradation as detailed above.  N  . Endometriosis   . GERD (gastroesophageal reflux disease)   . Gram positive sepsis ([name]) 10/24/2014  . Gross hematuria 03/12/2013   Overview:  Overview:  status post replacement of suprapubic tube 03/04/2013  Last Assessment & Plan:  Recent urine culture collected in ED on 5/4 did not result in significant growth. Moreover, patient has been asymptomatic with normal WBC, no fever and no true symptoms making symptomatic UTI less likely.  Will continue to monitor for any sign of infection.  Patient's xarelto had been held during her recent hospitalization due to hematuria. This has since been re-started. Monitor bleeding.    [name] Kitchen HCAP (healthcare-associated pneumonia) 10/25/2014  . HCAP (healthcare-associated pneumonia) 11/24/2017  . History of anticoagulant therapy 10/21/2012  . History of endometriosis 10/2012   OBGYN [facility]: Laparoscopic Endometrial Ablation, TVH,  . History  of recurrent UTIs   . Hypertension   . HYPERTENSION, BENIGN 01/31/2010   Qualifier: Diagnosis of  By: [name], MD, [name] Incontinent of feces 03/11/2016  . Infantile cerebral palsy ([name]) 01/29/2007   Overview:  She has spastic CP.  She is mentally intact.  She is wheelchair bound.  She is wheelchair bound for ambulation      . Intracervical pessary 05/07/2011   Overview:  Overview:  Placed by [facility] 04/30/11 to treat DUB and Endometriosis.  She is seen at GYN clinic at [facility] but was considered a poor surgical candidate and referred her to [facility].  For DUB she had pelvic ultrasound that showed thin stripe and she had endometrial Bx by Dr Jennette Kettle in GYN clinic, which was negative.     . Macroscopic hematuria 03/10/2013   status post replacement of suprapubic tube 03/04/2013   . Migraine   .  OCD (obsessive compulsive disorder)   . Parapneumonic effusion 10/25/2014  . Presence of intrathecal baclofen pump 03/07/2014   R LQ. Insertion T10.    [name] Kitchen Psychiatric illness 10/31/2014  . Pulmonary embolism ([name])    Lifetime Coumadin  . Pulmonary embolism ([name]) 2011, 01/2011   Will be on lifetime coumadin  . Recurrent pulmonary embolism ([name]) 05/03/2011   Pt has history of recurrent PE and is on chronic coumadin.  Pt has been complaining of dyspnea that she describes as chest tightness.  She has been to the ER multiple times for this.  Usually she gets CTA and serial CE.  She has had at least 6 CTA in a period of since 2011.  She also has an O2 requirement. It is unclear why pt feels dyspneic, complains of chest tightness, or has O2 requirement.  I thought that her complaint may be from deconditioning and referred her to PT.  Unfortunately Medicaid only pays for 4 PT visits.  Pt should be doing these exercises at home, but she has not.  I have referred her to [facility], and she has been seen by [name].  He did not understand the etiology of her dyspnea and O2 requirement.  He will continue to follow  her.     . Seborrheic keratosis 01/18/2011  . Seizures ([name]) 02/12/2016  . Spasticity 01/05/2014  . Transient alteration of awareness, recurrent 09/08/2006   Recurrent episodes where Ronan  loses contact with her world and that have been studied thoroughly and appear nonepileptic in nature per Dr Terrace Arabia (neurology) The patient has had episodes starting in 2007 that initially began 1-4 times a day during which time she would have periods of unawareness followed by confusion. This continuous video EEG monitoring performed from October 8-12, 2007. Had   . Urethra dilated and patulous 07/03/2015   Patulous, Dilated Urethra. 5mL balloon on a 22-Fr catheter hoping this would prevent the bladder spasm from pushing the balloon down the urethra (Dr Logan Bores, Urology [facility], July 2016)     Past Surgical History:  Procedure Laterality Date  . ABDOMINAL HYSTERECTOMY    . APPENDECTOMY    . BACLOFEN PUMP REFILL     x 3 times  . CARPAL TUNNEL RELEASE  08/2008   Dr Teressa Senter  . CESAREAN SECTION     x 2  . CHOLECYSTECTOMY  10/14/2015   Procedure: LAPAROSCOPIC CHOLECYSTECTOMY;  Surgeon: Violeta Gelinas, MD;  Location: [facility] OR;  Service: General;;  . COLPOSCOPY  06/2000  . INTRAUTERINE DEVICE INSERTION  04/30/11   Inserted by [facility] for endometriosis  . LAPAROSCOPIC ASSISTED VAGINAL HYSTERECTOMY  10/27/2012  . MULTIPLE EXTRACTIONS WITH ALVEOLOPLASTY Bilateral 07/19/2013   Procedure: EXTRACTIONS #4, 1,61,09,60;  Surgeon: Francene Finders, DDS;  Location: [facility] OR;  Service: Oral Surgery;  Laterality: Bilateral;  . NEUROMA SURGERY Left    anterior and posterior intersosseous  . PAIN PUMP IMPLANTATION N/A 03/07/2014   Procedure: baclofen pump revision/replacement and Catheter connection replacement;  Surgeon: Cristi Loron, MD;  Location: [facility];  Service: Neurosurgery;  Laterality: N/A;  baclofen pump revision/replacement and Catheter connection replacement  . PROGRAMABLE BACLOFEN PUMP REVISION  03/17/14    Battery Replacement   . TUBAL LIGATION  2003  . URETHRA SURGERY    . WRIST SURGERY  06/2010   Dr Dierdre Searles, hand surgeon, [facility]    Family History  Problem Relation Age of Onset  . Asthma Father   . Colon cancer Maternal Grandmother  Died in her 6060's  . Breast cancer Paternal Grandmother        Died in her 2570's  . Heart attack Maternal Grandfather        Died in his 5870's  . Alzheimer's disease Paternal Grandfather        Died in his 6280's  . Breast cancer Mother   . Stomach cancer Neg Hx     Social History   Tobacco Use  . Smoking status: Never Smoker  . Smokeless tobacco: Never Used  Substance Use Topics  . Alcohol use: Yes    Alcohol/week: 0.6 oz    Types: 1 Glasses of wine per week    Comment: Drinks alcohol once a month.  . Drug use: No    Prior to Admission medications   Medication Sig Start Date End Date Taking? Authorizing Provider  amantadine (SYMMETREL) 100 MG capsule Take 100 mg by mouth daily.     [provider]  antiseptic oral rinse (BIOTENE) LIQD 15 mLs by Mouth Rinse route as needed for dry mouth.    [provider]  baclofen (LIORESAL) 20 MG tablet Take 20 mg by mouth 4 (four) times daily. For bladder spasms    [provider]  bisacodyl (DULCOLAX) 10 MG suppository Place 10 mg rectally as needed (For constipation.).    [provider]  busPIRone (BUSPAR) 5 MG tablet Take 5 mg by mouth 3 (three) times daily.    [provider]  butalbital-acetaminophen-caffeine (FIORICET, ESGIC) 50-325-40 MG tablet Take 2 tablets by mouth every 4 (four) hours as needed for headache (not controlled by Tylenol). 05/29/18   Darlin DropHall, Carole N, DO  clonazePAM (KLONOPIN) 0.5 MG tablet Take 1.5 tablets (0.75 mg total) by mouth 3 (three) times daily. 11/27/17   Narda BondsNettey, Ralph A, MD  feeding supplement, ENSURE ENLIVE, (ENSURE ENLIVE) LIQD Take 237 mLs by mouth 2 (two) times daily between meals. 05/30/18   Darlin DropHall, Carole N, DO  ferrous sulfate 325  (65 FE) MG tablet Take 325 mg by mouth daily.     [provider]  FLUoxetine (PROZAC) 40 MG capsule Take 80 mg by mouth daily.    [provider]  lactulose, encephalopathy, (ENULOSE) 10 GM/15ML SOLN Take 20 g by mouth daily as needed (For constipation.).    [provider]  lamoTRIgine (LAMICTAL) 100 MG tablet Take 100 mg by mouth at bedtime.    [provider]  lubiprostone (AMITIZA) 24 MCG capsule Take 1 capsule (24 mcg total) by mouth 2 (two) times daily with a meal. 01/01/17   Esterwood, [name] S, PA-C  Melatonin 5 MG TABS Take 5 mg by mouth at bedtime.     [provider]  Multiple Vitamins-Minerals (CERTA-VITE PO) Take 1 tablet by mouth daily.    [provider]  OVER THE COUNTER MEDICATION Take 1 Package by mouth 3 (three) times daily with meals. Magic cup     [provider]  oxybutynin (DITROPAN) 5 MG tablet Take 5 mg by mouth 4 (four) times daily.  02/20/16   [provider]  polyethylene glycol powder (GLYCOLAX/MIRALAX) powder Take 1  1/2 dose  daily in 12 ounces of fluid every day. Patient taking differently: Take 17 g by mouth 2 (two) times daily. Mix in 8 ounces of liquid. 01/01/17   Esterwood, [name] S, PA-C  QUEtiapine (SEROQUEL) 50 MG tablet Take 150 mg by mouth at bedtime.    [provider]  tiZANidine (ZANAFLEX) 4 MG capsule Take 4  mg by mouth 2 (two) times daily.     [provider]    Allergies Morphine   REVIEW OF SYSTEMS  Negative except as noted here or in the History of Present Illness.   PHYSICAL EXAMINATION  Initial Vital Signs Blood pressure 117/84, pulse 86, temperature (!) 97.5 F (36.4 C), resp. rate 16, last menstrual period 04/05/2011, SpO2 97 %.  Examination General: Well-nourished female in no acute distress; appearance consistent with age of record HENT: normocephalic; atraumatic Eyes: pupils equal, round and reactive to light; extraocular muscles intact Neck:  supple Heart: regular rate and rhythm Lungs: clear to auscultation bilaterally Abdomen: soft; nondistended; nontender; baclofen pump in right lower quadrant; bowel sounds present; suprapubic catheter draining clear yellow urine Extremities: Atrophy and contractures without acute deformity Neurologic: Awake, alert and oriented; quadriparesis  Skin: Warm and dry Psychiatric: Normal mood and affect   RESULTS  Summary of this visit's results, reviewed by myself:   EKG Interpretation  Date/Time:    Ventricular Rate:    PR Interval:    QRS Duration:   QT Interval:    QTC Calculation:   R Axis:     Text Interpretation:        Laboratory Studies: Results for orders placed or performed during the hospital encounter of 06/11/18 (from the past 24 hour(s))  CBC with Differential/Platelet     Status: None   Collection Time: 06/12/18  2:25 AM  Result Value Ref Range   WBC 7.4 4.0 - 10.5 K/uL   RBC 4.51 3.87 - 5.11 MIL/uL   Hemoglobin 13.6 12.0 - 15.0 g/dL   HCT 40.9 81.1 - 91.4 %   MCV 90.2 78.0 - 100.0 fL   MCH 30.2 26.0 - 34.0 pg   MCHC 33.4 30.0 - 36.0 g/dL   RDW 78.2 95.6 - 21.3 %   Platelets 320 150 - 400 K/uL   Neutrophils Relative % 39 %   Neutro Abs 2.9 1.7 - 7.7 K/uL   Lymphocytes Relative 45 %   Lymphs Abs 3.3 0.7 - 4.0 K/uL   Monocytes Relative 9 %   Monocytes Absolute 0.7 0.1 - 1.0 K/uL   Eosinophils Relative 7 %   Eosinophils Absolute 0.5 0.0 - 0.7 K/uL   Basophils Relative 0 %   Basophils Absolute 0.0 0.0 - 0.1 K/uL  Basic metabolic panel     Status: Abnormal   Collection Time: 06/12/18  2:25 AM  Result Value Ref Range   Sodium 137 135 - 145 mmol/L   Potassium 4.1 3.5 - 5.1 mmol/L   Chloride 102 98 - 111 mmol/L   CO2 27 22 - 32 mmol/L   Glucose, Bld 84 70 - 99 mg/dL   BUN 13 6 - 20 mg/dL   Creatinine, Ser 0.86 (L) 0.44 - 1.00 mg/dL   Calcium 9.3 8.9 - 57.8 mg/dL   GFR calc non Af Amer >60 >60 mL/min   GFR calc Af Amer >60 >60 mL/min   Anion gap 8 5 - 15   Protime-INR     Status: Abnormal   Collection Time: 06/12/18  2:25 AM  Result Value Ref Range   Prothrombin Time 17.8 (H) 11.4 - 15.2 seconds   INR 1.48    Imaging Studies: Ct Head Wo Contrast  Result Date: 06/12/2018 CLINICAL DATA:  Headache, on blood thinners EXAM: CT HEAD WITHOUT CONTRAST TECHNIQUE: Contiguous axial images were obtained from the base of the skull through the vertex without intravenous contrast. COMPARISON:  05/25/2018 FINDINGS: Brain:  No evidence of acute infarction, hemorrhage, hydrocephalus, extra-axial collection or mass lesion/mass effect. Vascular: No hyperdense vessel or unexpected calcification. Skull: No acute osseous finding. Partially covered cervical fusion hardware. Sinuses/Orbits: Negative IMPRESSION: Negative head CT. Electronically Signed   By: Marnee Spring M.D.   On: 06/12/2018 01:13    ED COURSE and MDM  Nursing notes and initial vitals signs, including pulse oximetry, reviewed.  Vitals:   06/11/18 2335 06/12/18 0300 06/12/18 0330  BP: 117/84 107/71 110/68  Pulse: 86 78 67  Resp: 16 16   Temp: (!) 97.5 F (36.4 C)    SpO2: 97% 96% 95%   We will scan the patient's head as she has a headache in the setting of recent adjustments to her Coumadin dose.  We will give IV fluids and medications for treatment of likely migraine.  3:19 AM Patient still complaining of headache though improved from earlier.  We will add Toradol.  INR is subtherapeutic.  We will restart Coumadin.  4:35 AM Patient's pain improved after IV Toradol.  Patient is requesting "something for my migraines" until she can see her neurologist in 2 weeks.  The patient is already on Fioricet as needed.  I do not feel comfortable adding anything to this especially a narcotic.  We will restart her Coumadin as noted above.   PROCEDURES    ED DIAGNOSES     ICD-10-CM   1. Sporadic migraine G43.409   2. No contraindication to warfarin at discharge Z78.9        Allison Deshotels, Jonny Ruiz,  MD 06/12/18 [phone]

## 2018-06-12 ENCOUNTER — Telehealth (INDEPENDENT_AMBULATORY_CARE_PROVIDER_SITE_OTHER): Payer: Self-pay | Admitting: Family

## 2018-06-12 ENCOUNTER — Emergency Department (HOSPITAL_COMMUNITY): Payer: Medicare Other

## 2018-06-12 DIAGNOSIS — G4489 Other headache syndrome: Secondary | ICD-10-CM | POA: Diagnosis not present

## 2018-06-12 DIAGNOSIS — G43409 Hemiplegic migraine, not intractable, without status migrainosus: Secondary | ICD-10-CM | POA: Diagnosis not present

## 2018-06-12 DIAGNOSIS — Z7401 Bed confinement status: Secondary | ICD-10-CM | POA: Diagnosis not present

## 2018-06-12 DIAGNOSIS — M2550 Pain in unspecified joint: Secondary | ICD-10-CM | POA: Diagnosis not present

## 2018-06-12 DIAGNOSIS — R51 Headache: Secondary | ICD-10-CM | POA: Diagnosis not present

## 2018-06-12 LAB — BASIC METABOLIC PANEL
Anion gap: 8 (ref 5–15)
BUN: 13 mg/dL (ref 6–20)
CALCIUM: 9.3 mg/dL (ref 8.9–10.3)
CO2: 27 mmol/L (ref 22–32)
CREATININE: 0.43 mg/dL — AB (ref 0.44–1.00)
Chloride: 102 mmol/L (ref 98–111)
GFR calc Af Amer: >60 mL/min (ref >60)
Glucose, Bld: 84 mg/dL (ref 70–99)
Potassium: 4.1 mmol/L (ref 3.5–5.1)
SODIUM: 137 mmol/L (ref 135–145)

## 2018-06-12 LAB — CBC WITH DIFFERENTIAL/PLATELET
Basophils Absolute: 0 10*3/uL (ref 0.0–0.1)
Basophils Relative: 0 %
EOS ABS: 0.5 10*3/uL (ref 0.0–0.7)
EOS PCT: 7 %
HCT: 40.7 % (ref 36.0–46.0)
Hemoglobin: 13.6 g/dL (ref 12.0–15.0)
LYMPHS ABS: 3.3 10*3/uL (ref 0.7–4.0)
Lymphocytes Relative: 45 %
MCH: 30.2 pg (ref 26.0–34.0)
MCHC: 33.4 g/dL (ref 30.0–36.0)
MCV: 90.2 fL (ref 78.0–100.0)
MONOS PCT: 9 %
Monocytes Absolute: 0.7 10*3/uL (ref 0.1–1.0)
Neutro Abs: 2.9 10*3/uL (ref 1.7–7.7)
Neutrophils Relative %: 39 %
PLATELETS: 320 10*3/uL (ref 150–400)
RBC: 4.51 MIL/uL (ref 3.87–5.11)
RDW: 15.1 % (ref 11.5–15.5)
WBC: 7.4 10*3/uL (ref 4.0–10.5)

## 2018-06-12 LAB — PROTIME-INR
INR: 1.48
PROTHROMBIN TIME: 17.8 s — AB (ref 11.4–15.2)

## 2018-06-12 MED ORDER — DIPHENHYDRAMINE HCL 50 MG/ML IJ SOLN
25.0000 mg | Freq: Once | INTRAMUSCULAR | Status: AC
  Administered 2018-06-12: 25 mg via INTRAVENOUS
  Filled 2018-06-12: qty 1

## 2018-06-12 MED ORDER — SODIUM CHLORIDE 0.9 % IV BOLUS
1000.0000 mL | Freq: Once | INTRAVENOUS | Status: AC
  Administered 2018-06-12: 1000 mL via INTRAVENOUS

## 2018-06-12 MED ORDER — METOCLOPRAMIDE HCL 5 MG/ML IJ SOLN
10.0000 mg | Freq: Once | INTRAMUSCULAR | Status: AC
  Administered 2018-06-12: 10 mg via INTRAVENOUS
  Filled 2018-06-12: qty 2

## 2018-06-12 MED ORDER — WARFARIN SODIUM 6 MG PO TABS: 6.0000 mg | ORAL_TABLET | Freq: Every day | ORAL | 0 refills | Status: DC

## 2018-06-12 MED ORDER — KETOROLAC TROMETHAMINE 15 MG/ML IJ SOLN
15.0000 mg | Freq: Once | INTRAMUSCULAR | Status: AC
  Administered 2018-06-12: 15 mg via INTRAVENOUS
  Filled 2018-06-12: qty 1

## 2018-06-12 MED ORDER — WARFARIN SODIUM 6 MG PO TABS
6.0000 mg | ORAL_TABLET | Freq: Once | ORAL | Status: AC
  Administered 2018-06-12: 6 mg via ORAL
  Filled 2018-06-12: qty 1

## 2018-06-12 NOTE — ED Notes (Signed)
IV team still in room with patient.

## 2018-06-12 NOTE — Telephone Encounter (Signed)
Thank you so much, I agree with this plan.

## 2018-06-12 NOTE — ED Notes (Signed)
Report given to [place], Charity fundraiserN at [facility]

## 2018-06-12 NOTE — Telephone Encounter (Signed)
[name] [name] called with complaint of daily headaches. She said that she had been to the ER twice for headaches and that she has been taking Fioricet 2 tablets every 4 hours but that she only has pain relief for 2 hours. [name] [name] asked what else she could try for headache and if she could take a headache prevention medication. She also asked for sooner appointment than her scheduled appointment with Dr Sharene SkeansHickling on July 23rd. I talked with [name] [name] about the headaches and explained that she was taking too much Fioricet and to try to reduce her use of that. I told her that she needed to be drinking plenty of water and managing stress, as she is known to have anxiety and depression. [name] [name] does not skip meals and says that she sleeps ok when she does not have a headache. I told [name] [name] that I would not prescribe a migraine prevention medication without seeing her and offered to see her today but she does not have transportation. I told [name] [name] that she could discuss her headaches at her upcoming appointment with Dr Sharene SkeansHickling but that she needs to try the things that I recommended in the interim. I told [name] [name] that there were no sooner appointments with Dr Sharene SkeansHickling available at this time and that I am out of the office next week. [name] [name] had no further questions and agreed with the plans made today. TG

## 2018-06-12 NOTE — ED Notes (Signed)
[facility] called for transport.  

## 2018-06-12 NOTE — ED Notes (Addendum)
Attempted to start IV for meds and blood draw unsuccessfully. Patient tolerated well.

## 2018-06-15 DIAGNOSIS — I80201 Phlebitis and thrombophlebitis of unspecified deep vessels of right lower extremity: Secondary | ICD-10-CM | POA: Diagnosis not present

## 2018-06-16 DIAGNOSIS — M6281 Muscle weakness (generalized): Secondary | ICD-10-CM | POA: Diagnosis not present

## 2018-06-16 DIAGNOSIS — Z79899 Other long term (current) drug therapy: Secondary | ICD-10-CM | POA: Diagnosis not present

## 2018-06-16 DIAGNOSIS — M542 Cervicalgia: Secondary | ICD-10-CM | POA: Diagnosis not present

## 2018-06-16 DIAGNOSIS — M4322 Fusion of spine, cervical region: Secondary | ICD-10-CM | POA: Diagnosis not present

## 2018-06-16 DIAGNOSIS — G809 Cerebral palsy, unspecified: Secondary | ICD-10-CM | POA: Diagnosis not present

## 2018-06-17 DIAGNOSIS — M4322 Fusion of spine, cervical region: Secondary | ICD-10-CM | POA: Diagnosis not present

## 2018-06-17 DIAGNOSIS — M6281 Muscle weakness (generalized): Secondary | ICD-10-CM | POA: Diagnosis not present

## 2018-06-17 DIAGNOSIS — M542 Cervicalgia: Secondary | ICD-10-CM | POA: Diagnosis not present

## 2018-06-18 ENCOUNTER — Encounter: Payer: Self-pay | Admitting: Physical Medicine & Rehabilitation

## 2018-06-18 ENCOUNTER — Ambulatory Visit (HOSPITAL_BASED_OUTPATIENT_CLINIC_OR_DEPARTMENT_OTHER): Payer: Medicare Other | Admitting: Physical Medicine & Rehabilitation

## 2018-06-18 ENCOUNTER — Encounter: Payer: Medicare Other | Attending: Physical Medicine & Rehabilitation

## 2018-06-18 ENCOUNTER — Other Ambulatory Visit: Payer: Self-pay

## 2018-06-18 ENCOUNTER — Encounter: Payer: Self-pay | Admitting: Neurology

## 2018-06-18 VITALS — BP 125/84 | HR 102

## 2018-06-18 DIAGNOSIS — M6281 Muscle weakness (generalized): Secondary | ICD-10-CM | POA: Diagnosis not present

## 2018-06-18 DIAGNOSIS — G800 Spastic quadriplegic cerebral palsy: Secondary | ICD-10-CM | POA: Diagnosis not present

## 2018-06-18 DIAGNOSIS — M542 Cervicalgia: Secondary | ICD-10-CM | POA: Diagnosis not present

## 2018-06-18 DIAGNOSIS — M4322 Fusion of spine, cervical region: Secondary | ICD-10-CM | POA: Diagnosis not present

## 2018-06-18 DIAGNOSIS — R252 Cramp and spasm: Secondary | ICD-10-CM | POA: Diagnosis not present

## 2018-06-18 NOTE — Patient Instructions (Signed)

## 2018-06-18 NOTE — Progress Notes (Signed)
Dysport Injection for spasticity using needle EMG guidance  Dilution: 200 Units/ml Indication: Severe spasticity which interferes with ADL,mobility and/or  hygiene and is unresponsive to medication management and other conservative care Informed consent was obtained after describing risks and benefits of the procedure with the patient. This includes bleeding, bruising, infection, excessive weakness, or medication side effects. A REMS form is on file and signed. Needle:  needle electrode Number of units per muscle Biceps200 FCR200 FCU100 FDP100 FDS 200 Pronator teres 100 Pronator quad 100 All injections were done after obtaining appropriate EMG activity and after negative drawback for blood. The patient tolerated the procedure well. Post procedure instructions were given. A followup appointment was made.

## 2018-06-19 DIAGNOSIS — M4322 Fusion of spine, cervical region: Secondary | ICD-10-CM | POA: Diagnosis not present

## 2018-06-19 DIAGNOSIS — M542 Cervicalgia: Secondary | ICD-10-CM | POA: Diagnosis not present

## 2018-06-19 DIAGNOSIS — M6281 Muscle weakness (generalized): Secondary | ICD-10-CM | POA: Diagnosis not present

## 2018-06-19 DIAGNOSIS — I4891 Unspecified atrial fibrillation: Secondary | ICD-10-CM | POA: Diagnosis not present

## 2018-06-20 DIAGNOSIS — M542 Cervicalgia: Secondary | ICD-10-CM | POA: Diagnosis not present

## 2018-06-20 DIAGNOSIS — M6281 Muscle weakness (generalized): Secondary | ICD-10-CM | POA: Diagnosis not present

## 2018-06-20 DIAGNOSIS — M4322 Fusion of spine, cervical region: Secondary | ICD-10-CM | POA: Diagnosis not present

## 2018-06-22 DIAGNOSIS — M6281 Muscle weakness (generalized): Secondary | ICD-10-CM | POA: Diagnosis not present

## 2018-06-22 DIAGNOSIS — M4322 Fusion of spine, cervical region: Secondary | ICD-10-CM | POA: Diagnosis not present

## 2018-06-22 DIAGNOSIS — M542 Cervicalgia: Secondary | ICD-10-CM | POA: Diagnosis not present

## 2018-06-22 DIAGNOSIS — I82402 Acute embolism and thrombosis of unspecified deep veins of left lower extremity: Secondary | ICD-10-CM | POA: Diagnosis not present

## 2018-06-23 ENCOUNTER — Encounter (INDEPENDENT_AMBULATORY_CARE_PROVIDER_SITE_OTHER): Payer: Medicare Other | Admitting: Pediatrics

## 2018-06-23 DIAGNOSIS — F4322 Adjustment disorder with anxiety: Secondary | ICD-10-CM | POA: Diagnosis not present

## 2018-06-23 DIAGNOSIS — M6281 Muscle weakness (generalized): Secondary | ICD-10-CM | POA: Diagnosis not present

## 2018-06-23 DIAGNOSIS — M4322 Fusion of spine, cervical region: Secondary | ICD-10-CM | POA: Diagnosis not present

## 2018-06-23 DIAGNOSIS — M542 Cervicalgia: Secondary | ICD-10-CM | POA: Diagnosis not present

## 2018-06-24 DIAGNOSIS — M6281 Muscle weakness (generalized): Secondary | ICD-10-CM | POA: Diagnosis not present

## 2018-06-24 DIAGNOSIS — M542 Cervicalgia: Secondary | ICD-10-CM | POA: Diagnosis not present

## 2018-06-24 DIAGNOSIS — M4322 Fusion of spine, cervical region: Secondary | ICD-10-CM | POA: Diagnosis not present

## 2018-06-25 ENCOUNTER — Encounter (INDEPENDENT_AMBULATORY_CARE_PROVIDER_SITE_OTHER): Payer: Self-pay | Admitting: Pediatrics

## 2018-06-25 ENCOUNTER — Ambulatory Visit (INDEPENDENT_AMBULATORY_CARE_PROVIDER_SITE_OTHER): Payer: Medicare Other | Admitting: Pediatrics

## 2018-06-25 DIAGNOSIS — G808 Other cerebral palsy: Secondary | ICD-10-CM

## 2018-06-25 DIAGNOSIS — G803 Athetoid cerebral palsy: Secondary | ICD-10-CM | POA: Diagnosis not present

## 2018-06-25 DIAGNOSIS — M4322 Fusion of spine, cervical region: Secondary | ICD-10-CM | POA: Diagnosis not present

## 2018-06-25 DIAGNOSIS — G43009 Migraine without aura, not intractable, without status migrainosus: Secondary | ICD-10-CM

## 2018-06-25 DIAGNOSIS — M6281 Muscle weakness (generalized): Secondary | ICD-10-CM | POA: Diagnosis not present

## 2018-06-25 DIAGNOSIS — M542 Cervicalgia: Secondary | ICD-10-CM | POA: Diagnosis not present

## 2018-06-25 NOTE — Progress Notes (Addendum)
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 female who returns today for the first time since the very 25 2019 to empty refill and reprogram her intrathecal baclofen pump.  Her course has been variable.  When the pump was initially placed, she was ambulatory and then because of deconditioning lack of therapy and motivation, she became wheelchair-bound.  She has lost weight and has worked very hard to improve her mobility in part fueled by her desire to leave her skilled nursing facility.  She is able to walk independently up to 450 feet, to get in and out of her wheelchair with less assistance and to dresser and feed herself more independently.  She tells me that she has plans to leave her skilled nursing facility and moving to independent living with a CMA sometime next year.  She was hospitalized June 24-29 with symptomatic urinary tract infection associated with fever  She was hospitalized June 24-29 with symptomatic urinary tract infection.  She had subtherapeutic Coumadin level with a diagnosis of recurrent pulmonary embolism but was not symptomatic at that time.  She had emergency department evaluation on June 11, 2018 for intermittent migraine.  She had frontally predominant severe and pounding pain with nausea photophobia it was not relieved by prescription medications.  She had a CT scan of the brain based on history of headache in a patient taking warfarin.  The study was unchanged.  She was given IV Toradol with relief of symptoms.  She is insistent today that I do something about her  migraines, but she had a mixup with her transportation and came in over an hour late.  I did not have time to deal with that today.  Procedure: Reprogramming intrathecal baclofen pump  The device was interrogated and showed a complex continuous infusion of baclofen that has a basal rate of 21.58mcg per hour. The patient receives bolus doses of 27 mcg delivered at midnight, 6 AM, 12 noon, and 6 PM, for total of 525. per day. I did not need to reprogram the infusion rate today.  The patient was sterilely prepped and draped. A 1-1/2 inch 22-gauge noncoring Huebner needle was inserted on the third pass. The apex of the reservoir is going horizontally to the left. 3.41mL was withdrawn from the pump placing it under partial vacuum. 40 mL of baclofen (concentration 2000 mcg/mL) was instilled into the pump through a Millipore filter. She tolerated the procedure well.  Her reservoir alarm date is  November 20, 2018. ERI is 29 months.    Review of Systems: A complete review of systems was assessed and is remarkable for problems with migraines, and anxiety.  She says that her legs at times have movements that she cannot control.  Past Medical History Past Medical History:  Diagnosis Date  . Abdominal pain 01/02/2012   Overview:  Overview:  Per Previous pcp note- Dr. Ta--Pt has history of endometriosis and had DUB in 2011.  She She had u/s that showed normal endometrial stripe.  She had endometrial bx that was wnl.  She has a lot of abd cramping every month.  This cramping was sometimes not relieved with hydrocodone.  She was  referred to [facility] for IUD placement to help endometriosis and abd cramping.  [name]  . Abdominal pain 01/02/2012   Overview:  Overview:  Per Previous pcp note- Dr. Ta--Pt has history of endometriosis and had DUB in 2011.  She She had u/s that showed normal endometrial stripe.  She had endometrial bx that was wnl.  She has a lot of abd cramping every month.  This cramping  was sometimes not relieved with hydrocodone.  She was referred to [facility] for IUD placement to help endometriosis and abd cramping.  [name] was placed 04/30/11 by [facility].  Pt still complains of abd cramping, which I am treating with hydrocodone and ultram. Pt reports some initial relief of pain from IUD, but states it has now returned back to similar to previous cramping. Gave patient a shot of Toradol 60 mg IM today-since positive exacerbation of abdominal cramping during past 2 weeks. Physical exam reassuring. Patient may need to return to [facility] for further workup since she is plugged in with the GYN providers there for further treatment options regarding endometriosis and abdominal cramping. The dysfunctional uterine bleeding now well contr  . Acute cystitis with hematuria   . Acute cystitis without hematuria   . Acute respiratory failure with hypoxia ([name])   . Anemia   . Cellulitis 12/22/2014  . Cerebral palsy ([name])    Spastic Cerebral palsy, mentally intact  . Contracture, joint, multiple sites    Electric wheelchair, uses left hand to operate chair.   . Depression   . Disease of female genital organs 10/24/2010   Overview:  Overview:  Pt has history of endometriosis and had DUB in 2011.  She had u/s that showed normal endometrial stripe.  She had endometrial bx that was wnl.  She has a lot of abd cramping every month.  This cramping was sometimes not relieved with hydrocodone.  She was referred to [facility] for IUD placement to help endometriosis and abd cramping.  [name] was placed 04/30/11 by [facility].  Pt still complains of abd cramping, which was treated with hydrocodone and ultram.   Last seen at [facility] by OB/GYN 12/24/11, given Depo Lupron injection.  Ordering CT scan to r/o additional cause of abdominal pain (pt with known endometriosis and will not tolerate vaginal U/S, pain not improved s/p [name] or with Lupron injection-- only caused hot flashes and amenorrhea, no pain relief).   Also made referral to pain clinic for better pain control.  Do not think hysterectomy would help with pain, and concern for contamination of baclofen pump if did proceed with surgery.    8/5/13[name]: Pt was seen by   . [name]   . Dysarthria   . Dyspnea and respiratory abnormality 02/04/2011   Overview:  Overview:  Pt has history of recurrent PE and is on chronic coumadin.  Pt has been complaining of dyspnea that she describes as chest tightness.  She has been to the ER multiple times for this.  Usually she gets CTA and serial CE.  She has had at least 6 CTA in a period of since 2011.  She also has an O2 requirement. It is unclear why pt feels dyspneic, complains of chest tightness, or has O2 requirement.  I thought that her complaint may be from deconditioning and referred her to PT.  Unfortunately Medicaid only pays for 4 PT visits.  Pt should be doing these exercises at home, but she has not.  I have referred  her to [facility], and she has been seen by [name].  He did not understand the etiology of her dyspnea and O2 requirement.  He will continue to follow her.   Last Assessment & Plan:  Pt presents with concerns for chest tightness and dyspnea.  She was seen in ED yesterday and discharged home.  While there she had a CT chest 04/20/11 showed 1.  Multifocal degradation as detailed above.  N  . Endometriosis   . GERD (gastroesophageal reflux disease)   . Gram positive sepsis ([name]) 10/24/2014  . Gross hematuria 03/12/2013   Overview:  Overview:  status post replacement of suprapubic tube 03/04/2013  Last Assessment & Plan:  Recent urine culture collected in ED on 5/4 did not result in significant growth. Moreover, patient has been asymptomatic with normal WBC, no fever and no true symptoms making symptomatic UTI less likely.  Will continue to monitor for any sign of infection.  Patient's xarelto had been held during her recent hospitalization due to hematuria. This has since  been re-started. Monitor bleeding.    [name] Kitchen HCAP (healthcare-associated pneumonia) 10/25/2014  . HCAP (healthcare-associated pneumonia) 11/24/2017  . History of anticoagulant therapy 10/21/2012  . History of endometriosis 10/2012   OBGYN [facility]: Laparoscopic Endometrial Ablation, TVH,  . History of recurrent UTIs   . Hypertension   . HYPERTENSION, BENIGN 01/31/2010   Qualifier: Diagnosis of  By: [name], MD, [name] Incontinent of feces 03/11/2016  . Infantile cerebral palsy ([name]) 01/29/2007   Overview:  She has spastic CP.  She is mentally intact.  She is wheelchair bound.  She is wheelchair bound for ambulation      . Intracervical pessary 05/07/2011   Overview:  Overview:  Placed by [facility] 04/30/11 to treat DUB and Endometriosis.  She is seen at GYN clinic at [facility] but was considered a poor surgical candidate and referred her to [facility].  For DUB she had pelvic ultrasound that showed thin stripe and she had endometrial Bx by Dr Jennette Kettle in GYN clinic, which was negative.     . Macroscopic hematuria 03/10/2013   status post replacement of suprapubic tube 03/04/2013   . Migraine   . OCD (obsessive compulsive disorder)   . Parapneumonic effusion 10/25/2014  . Presence of intrathecal baclofen pump 03/07/2014   R LQ. Insertion T10.    [name] Kitchen Psychiatric illness 10/31/2014  . Pulmonary embolism ([name])    Lifetime Coumadin  . Pulmonary embolism ([name]) 2011, 01/2011   Will be on lifetime coumadin  . Recurrent pulmonary embolism ([name]) 05/03/2011   Pt has history of recurrent PE and is on chronic coumadin.  Pt has been complaining of dyspnea that she describes as chest tightness.  She has been to the ER multiple times for this.  Usually she gets CTA and serial CE.  She has had at least 6 CTA in a period of since 2011.  She also has an O2 requirement. It is unclear why pt feels dyspneic, complains of chest tightness, or has O2 requirement.  I thought that her complaint may be from deconditioning and  referred her to PT.  Unfortunately Medicaid only pays for 4 PT visits.  Pt should be doing these exercises at home, but she has not.  I have referred her to [facility], and she has been seen by [name].  He did not understand the etiology of her dyspnea and O2 requirement.  He will continue to follow her.     [name] Kitchen  Seborrheic keratosis 01/18/2011  . Seizures ([name]) 02/12/2016  . Spasticity 01/05/2014  . Transient alteration of awareness, recurrent 09/08/2006   Recurrent episodes where [name]  loses contact with her world and that have been studied thoroughly and appear nonepileptic in nature per Dr Terrace ArabiaYan (neurology) The patient has had episodes starting in 2007 that initially began 1-4 times a day during which time she would have periods of unawareness followed by confusion. This continuous video EEG monitoring performed from October 8-12, 2007. Had   . Urethra dilated and patulous 07/03/2015   Patulous, Dilated Urethra. 5mL balloon on a 22-Fr catheter hoping this would prevent the bladder spasm from pushing the balloon down the urethra (Dr Logan BoresEvans, Urology [facility], July 2016)    Hospitalizations: Yes.  , Head Injury: No., Nervous System Infections: No., Immunizations up to date: Yes.    Birth History Extreme prematurity  Behavior History Anxiety and depression  Surgical History Past Surgical History:  Procedure Laterality Date  . ABDOMINAL HYSTERECTOMY    . APPENDECTOMY    . BACLOFEN PUMP REFILL     x 3 times  . CARPAL TUNNEL RELEASE  08/2008   Dr Teressa SenterSypher  . CESAREAN SECTION     x 2  . CHOLECYSTECTOMY  10/14/2015   Procedure: LAPAROSCOPIC CHOLECYSTECTOMY;  Surgeon: Violeta GelinasBurke Thompson, MD;  [facility];  Service: General;;  . COLPOSCOPY  06/2000  . INTRAUTERINE DEVICE INSERTION  04/30/11   [facility]  . LAPAROSCOPIC ASSISTED VAGINAL HYSTERECTOMY  10/27/2012  . MULTIPLE EXTRACTIONS WITH ALVEOLOPLASTY Bilateral 07/19/2013   Procedure: EXTRACTIONS #4, 5,28,41,325,11,16,19;   Surgeon: Francene Findershristopher L Beatrice, DDS;  [facility];  Service: Oral Surgery;  Laterality: Bilateral;  . NEUROMA SURGERY Left    anterior and posterior intersosseous  . PAIN PUMP IMPLANTATION N/A 03/07/2014   Procedure: baclofen pump revision/replacement and Catheter connection replacement;  Surgeon: Cristi LoronJeffrey D Jenkins, MD;  Location: [facility];  Service: Neurosurgery;  Laterality: N/A;  baclofen pump revision/replacement and Catheter connection replacement  . PROGRAMABLE BACLOFEN PUMP REVISION  03/17/14   Battery Replacement   . TUBAL LIGATION  2003  . URETHRA SURGERY    . WRIST SURGERY  06/2010   Dr Dierd[facility]lesLi[facility]   Family History family history includes Alzheimer's disease in her paternal grandfather; Asthma in her father; Breast cancer in her mother and paternal grandmother; Colon cancer in her maternal grandmother; Heart attack in her maternal grandfather. Family history is negative for migraines, seizures, intellectual disabilities, blindness, deafness, birth defects, chromosomal disorder, or autism.  Social History Social History   Socioeconomic History  . Marital status: Divorced    Spouse name: Not on file  . Number of children: 2  . Years of education: college  . Highest education level:  Community college degree  Occupational History    Employer: UNEMPLOYED  Social Needs  . Financial resource strain: Not on file  . Food insecurity:    Worry: Not on file    Inability: Not on file  . Transportation needs:    Medical: Not on file    Non-medical: Not on file  Tobacco Use  . Smoking status: Never Smoker  . Smokeless tobacco: Never Used  Substance and Sexual Activity  . Alcohol use: Yes    Alcohol/week: 0.6 oz    Types: 1 Glasses of wine per week    Comment: Drinks alcohol once a month.  . Drug use: No  . 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     She enjoys  shopping.    Lives in a nursing facility[facility] since 02/11/16       Ailene Ards, who has schizophrenia, MR, is father of daughter.  Homero Fellers and pt are no longer together.  Homero Fellers does not see [name]. Daughter is 37 yo, Stage manager, lives with pt.    Needs assistance with ADL's and IADL's--has personal care services 20 hrs/wk;        No tobacco, EtOH, drugs.       **Patient very concerned about her kids being taken from her.       **Case Manager: Jack Quarto [phone]       Patient lives [facility]   Allergies Allergen Reactions  . Morphine Dermatitis and Other (See Comments)    Skin turned red   Physical Exam LMP 04/05/2011   Spastic athetoid tone in her limbs in all move independently.  She has comes in fine motor movements, severe dysarthria that makes it difficult to understand her.  Tone is increased in her legs but is stable from prior assessments.  Assessment 1.  Cerebral palsy, quadriplegia, G80.8. 2.  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 3.  Migraine without aura and without status migrainosus, not intractable, G43.009.  Discussion [name] is doing very well with her response to intrathecal baclofen.  I think that she is taking too much Fioricet and is having problems with rebound.  Typically when she has problems with blackouts or other neurologic symptoms occur during times when she is under a lot of stress.  Plan She will return to empty refill her intrathecal baclofen pump November 18, 2018.  Either I or Elveria Rising will see her for her migraines as soon as we can schedule a visit.  She has an appointment with Dr. Shon Millet of [facility] for August 19, 2018.  I would like her to keep that appointment as we transition all but her baclofen pump to adult neurology.   Medication List    Accurate as of 06/25/18 10:03 AM.      amantadine 100 MG capsule Commonly known as:  SYMMETREL Take 100 mg by mouth daily.     antiseptic oral rinse Liqd 15 mLs by Mouth Rinse route as needed for dry mouth.   baclofen 20 MG tablet Commonly known as:  LIORESAL Take 20 mg by mouth 4 (four) times daily. For bladder spasms   bisacodyl 10 MG suppository Commonly known as:  DULCOLAX Place 10 mg rectally as needed (For constipation.).   busPIRone 5 MG tablet Commonly known as:  BUSPAR Take 5 mg by mouth 3 (three) times daily.   butalbital-acetaminophen-caffeine 50-325-40 MG tablet Commonly known as:  FIORICET, ESGIC Take 2 tablets by mouth every 4 (four) hours as needed for headache (not controlled by Tylenol).   CERTA-VITE PO Take 1 tablet by mouth daily.   clonazePAM 0.5 MG tablet Commonly known as:  KLONOPIN Take 1.5 tablets (0.75 mg total) by mouth 3 (three) times daily.   ENULOSE 10 GM/15ML Soln Generic drug:  lactulose (encephalopathy) Take 20 g by mouth daily as needed (For constipation.).   feeding supplement (ENSURE ENLIVE) Liqd Take 237 mLs by mouth 2 (two) times daily between meals.   ferrous sulfate 325 (65 FE) MG tablet Take 325 mg by mouth daily.   FLUoxetine 40 MG capsule Commonly known as:  PROZAC Take 80 mg by mouth  daily.   lamoTRIgine 100 MG tablet Commonly known as:  LAMICTAL Take 100 mg by mouth at bedtime.   lubiprostone 24 MCG capsule Commonly known as:  AMITIZA Take 1 capsule (24 mcg total) by mouth 2 (two) times daily with a meal.   Melatonin 5 MG Tabs Take 5 mg by mouth at bedtime.   OVER THE COUNTER MEDICATION Take 1 Package by mouth 3 (three) times daily with meals. Magic cup   oxybutynin 5 MG tablet Commonly known as:  DITROPAN Take 5 mg by mouth 4 (four) times daily.   polyethylene glycol powder powder Commonly known as:  GLYCOLAX/MIRALAX Take 1  1/2 dose  daily in 12 ounces of fluid every day.   QUEtiapine 50 MG tablet Commonly known as:  SEROQUEL Take 150 mg by mouth at bedtime.   tiZANidine 4 MG capsule Commonly known as:  ZANAFLEX Take 4 mg by  mouth 2 (two) times daily.   warfarin 6 MG tablet Commonly known as:  COUMADIN Take as directed by the anticoagulation clinic. If you are unsure how to take this medication, talk to your nurse or doctor. Original instructions:  Take 1 tablet (6 mg total) by mouth daily.    The medication list was reviewed and reconciled. All changes or newly prescribed medications were explained.  A complete medication list was provided to the patient/caregiver.  Deetta Perla MD

## 2018-06-25 NOTE — Patient Instructions (Signed)
We will set up an appointment soon to talk about your migraines.  Please keep a headache calendar and bring it with you to the next visit.

## 2018-06-26 DIAGNOSIS — I484 Atypical atrial flutter: Secondary | ICD-10-CM | POA: Diagnosis not present

## 2018-06-26 DIAGNOSIS — G43009 Migraine without aura, not intractable, without status migrainosus: Secondary | ICD-10-CM | POA: Insufficient documentation

## 2018-06-26 DIAGNOSIS — M6281 Muscle weakness (generalized): Secondary | ICD-10-CM | POA: Diagnosis not present

## 2018-06-26 DIAGNOSIS — M4322 Fusion of spine, cervical region: Secondary | ICD-10-CM | POA: Diagnosis not present

## 2018-06-26 DIAGNOSIS — M542 Cervicalgia: Secondary | ICD-10-CM | POA: Diagnosis not present

## 2018-06-29 DIAGNOSIS — M6281 Muscle weakness (generalized): Secondary | ICD-10-CM | POA: Diagnosis not present

## 2018-06-29 DIAGNOSIS — M542 Cervicalgia: Secondary | ICD-10-CM | POA: Diagnosis not present

## 2018-06-29 DIAGNOSIS — M4322 Fusion of spine, cervical region: Secondary | ICD-10-CM | POA: Diagnosis not present

## 2018-06-29 DIAGNOSIS — I484 Atypical atrial flutter: Secondary | ICD-10-CM | POA: Diagnosis not present

## 2018-06-30 DIAGNOSIS — M542 Cervicalgia: Secondary | ICD-10-CM | POA: Diagnosis not present

## 2018-06-30 DIAGNOSIS — M6281 Muscle weakness (generalized): Secondary | ICD-10-CM | POA: Diagnosis not present

## 2018-06-30 DIAGNOSIS — M4322 Fusion of spine, cervical region: Secondary | ICD-10-CM | POA: Diagnosis not present

## 2018-06-30 DIAGNOSIS — G809 Cerebral palsy, unspecified: Secondary | ICD-10-CM | POA: Diagnosis not present

## 2018-07-01 DIAGNOSIS — M542 Cervicalgia: Secondary | ICD-10-CM | POA: Diagnosis not present

## 2018-07-01 DIAGNOSIS — M6281 Muscle weakness (generalized): Secondary | ICD-10-CM | POA: Diagnosis not present

## 2018-07-01 DIAGNOSIS — M4322 Fusion of spine, cervical region: Secondary | ICD-10-CM | POA: Diagnosis not present

## 2018-07-02 DIAGNOSIS — M24541 Contracture, right hand: Secondary | ICD-10-CM | POA: Diagnosis not present

## 2018-07-02 DIAGNOSIS — G8250 Quadriplegia, unspecified: Secondary | ICD-10-CM | POA: Diagnosis not present

## 2018-07-02 DIAGNOSIS — M6281 Muscle weakness (generalized): Secondary | ICD-10-CM | POA: Diagnosis not present

## 2018-07-03 DIAGNOSIS — Z466 Encounter for fitting and adjustment of urinary device: Secondary | ICD-10-CM | POA: Diagnosis not present

## 2018-07-03 DIAGNOSIS — M6281 Muscle weakness (generalized): Secondary | ICD-10-CM | POA: Diagnosis not present

## 2018-07-03 DIAGNOSIS — M24541 Contracture, right hand: Secondary | ICD-10-CM | POA: Diagnosis not present

## 2018-07-03 DIAGNOSIS — I4891 Unspecified atrial fibrillation: Secondary | ICD-10-CM | POA: Diagnosis not present

## 2018-07-03 DIAGNOSIS — G43009 Migraine without aura, not intractable, without status migrainosus: Secondary | ICD-10-CM | POA: Diagnosis not present

## 2018-07-03 DIAGNOSIS — G8250 Quadriplegia, unspecified: Secondary | ICD-10-CM | POA: Diagnosis not present

## 2018-07-03 DIAGNOSIS — I484 Atypical atrial flutter: Secondary | ICD-10-CM | POA: Diagnosis not present

## 2018-07-03 DIAGNOSIS — N319 Neuromuscular dysfunction of bladder, unspecified: Secondary | ICD-10-CM | POA: Diagnosis not present

## 2018-07-06 DIAGNOSIS — M6281 Muscle weakness (generalized): Secondary | ICD-10-CM | POA: Diagnosis not present

## 2018-07-06 DIAGNOSIS — I484 Atypical atrial flutter: Secondary | ICD-10-CM | POA: Diagnosis not present

## 2018-07-06 DIAGNOSIS — M24541 Contracture, right hand: Secondary | ICD-10-CM | POA: Diagnosis not present

## 2018-07-06 DIAGNOSIS — G8250 Quadriplegia, unspecified: Secondary | ICD-10-CM | POA: Diagnosis not present

## 2018-07-07 ENCOUNTER — Encounter (HOSPITAL_COMMUNITY): Payer: Self-pay

## 2018-07-07 ENCOUNTER — Encounter (INDEPENDENT_AMBULATORY_CARE_PROVIDER_SITE_OTHER): Payer: Self-pay | Admitting: Family

## 2018-07-07 ENCOUNTER — Emergency Department (HOSPITAL_COMMUNITY)
Admission: EM | Admit: 2018-07-07 | Discharge: 2018-07-08 | Disposition: A | Discharge status: Home / Self Care | Payer: Medicare Other | Attending: Emergency Medicine | Admitting: Emergency Medicine

## 2018-07-07 ENCOUNTER — Other Ambulatory Visit: Payer: Self-pay

## 2018-07-07 ENCOUNTER — Ambulatory Visit (INDEPENDENT_AMBULATORY_CARE_PROVIDER_SITE_OTHER): Payer: Medicare Other | Admitting: Family

## 2018-07-07 VITALS — BP 108/84 | HR 78

## 2018-07-07 DIAGNOSIS — Z79899 Other long term (current) drug therapy: Secondary | ICD-10-CM | POA: Diagnosis not present

## 2018-07-07 DIAGNOSIS — Z7901 Long term (current) use of anticoagulants: Secondary | ICD-10-CM | POA: Insufficient documentation

## 2018-07-07 DIAGNOSIS — Z9049 Acquired absence of other specified parts of digestive tract: Secondary | ICD-10-CM | POA: Insufficient documentation

## 2018-07-07 DIAGNOSIS — I10 Essential (primary) hypertension: Secondary | ICD-10-CM | POA: Insufficient documentation

## 2018-07-07 DIAGNOSIS — R51 Headache: Secondary | ICD-10-CM | POA: Insufficient documentation

## 2018-07-07 DIAGNOSIS — F330 Major depressive disorder, recurrent, mild: Secondary | ICD-10-CM

## 2018-07-07 DIAGNOSIS — F428 Other obsessive-compulsive disorder: Secondary | ICD-10-CM | POA: Diagnosis not present

## 2018-07-07 DIAGNOSIS — G4489 Other headache syndrome: Secondary | ICD-10-CM | POA: Diagnosis not present

## 2018-07-07 DIAGNOSIS — F411 Generalized anxiety disorder: Secondary | ICD-10-CM

## 2018-07-07 DIAGNOSIS — G44221 Chronic tension-type headache, intractable: Secondary | ICD-10-CM

## 2018-07-07 DIAGNOSIS — G801 Spastic diplegic cerebral palsy: Secondary | ICD-10-CM

## 2018-07-07 DIAGNOSIS — G803 Athetoid cerebral palsy: Secondary | ICD-10-CM

## 2018-07-07 DIAGNOSIS — R519 Headache, unspecified: Secondary | ICD-10-CM

## 2018-07-07 DIAGNOSIS — G808 Other cerebral palsy: Secondary | ICD-10-CM | POA: Diagnosis not present

## 2018-07-07 DIAGNOSIS — G43009 Migraine without aura, not intractable, without status migrainosus: Secondary | ICD-10-CM

## 2018-07-07 DIAGNOSIS — M2450 Contracture, unspecified joint: Secondary | ICD-10-CM

## 2018-07-07 DIAGNOSIS — G809 Cerebral palsy, unspecified: Secondary | ICD-10-CM | POA: Diagnosis not present

## 2018-07-07 DIAGNOSIS — R52 Pain, unspecified: Secondary | ICD-10-CM | POA: Diagnosis not present

## 2018-07-07 DIAGNOSIS — G8250 Quadriplegia, unspecified: Secondary | ICD-10-CM | POA: Diagnosis not present

## 2018-07-07 DIAGNOSIS — F329 Major depressive disorder, single episode, unspecified: Secondary | ICD-10-CM | POA: Insufficient documentation

## 2018-07-07 DIAGNOSIS — G249 Dystonia, unspecified: Secondary | ICD-10-CM

## 2018-07-07 DIAGNOSIS — M24541 Contracture, right hand: Secondary | ICD-10-CM | POA: Diagnosis not present

## 2018-07-07 DIAGNOSIS — M6281 Muscle weakness (generalized): Secondary | ICD-10-CM | POA: Diagnosis not present

## 2018-07-07 MED ORDER — METOCLOPRAMIDE HCL 5 MG/ML IJ SOLN
10.0000 mg | Freq: Once | INTRAMUSCULAR | Status: AC
  Administered 2018-07-07: 10 mg via INTRAVENOUS
  Filled 2018-07-07: qty 2

## 2018-07-07 MED ORDER — DIPHENHYDRAMINE HCL 50 MG/ML IJ SOLN
25.0000 mg | Freq: Once | INTRAMUSCULAR | Status: AC
  Administered 2018-07-07: 25 mg via INTRAVENOUS
  Filled 2018-07-07: qty 1

## 2018-07-07 MED ORDER — KETOROLAC TROMETHAMINE 15 MG/ML IJ SOLN
10.0000 mg | Freq: Once | INTRAMUSCULAR | Status: AC
  Administered 2018-07-08: 10 mg via INTRAVENOUS
  Filled 2018-07-07: qty 1

## 2018-07-07 MED ORDER — METOCLOPRAMIDE HCL 5 MG/ML IJ SOLN
10.0000 mg | Freq: Once | INTRAMUSCULAR | Status: AC
  Administered 2018-07-08: 10 mg via INTRAVENOUS
  Filled 2018-07-07: qty 2

## 2018-07-07 MED ORDER — DEXAMETHASONE SODIUM PHOSPHATE 10 MG/ML IJ SOLN
10.0000 mg | Freq: Once | INTRAMUSCULAR | Status: AC
  Administered 2018-07-07: 10 mg via INTRAVENOUS
  Filled 2018-07-07: qty 1

## 2018-07-07 NOTE — ED Notes (Signed)
ED Provider at bedside. 

## 2018-07-07 NOTE — ED Notes (Signed)
Bed: ZO10WA11 Expected date:  Expected time:  Means of arrival:  Comments: 42yo F/Headache SNF

## 2018-07-07 NOTE — ED Triage Notes (Addendum)
[facility] EMS [facility]. Pt reports hx of migraines and has CP. Pt reports having intermittent headaches today. Reports taking oxycodone with no relief.

## 2018-07-07 NOTE — ED Provider Notes (Signed)
[facility]   CSN: [id number] Arrival date & time: 07/07/18  2010     History   Chief Complaint Chief Complaint  Patient presents with  . Headache    HPI [name] [name] is a 42 y.o. female.  HPI 42 year old female comes in with chief complaint of headache. Patient has history of complex headaches along with cerebral palsy and PE, on Coumadin.  Patient also has chronic headaches and has been seen in the ED the last 2 times for headaches.  Patient reports that her current headache started about 2 days ago.  Patient headache have been intermittent and, typically the last about 4 to 6 hours each.  Headaches are frontal, moderately severe and nonradiating.  Patient denies any new numbness, vision changes, tingling.  She states that she has taken oxycodone without any relief.  Patient has seen a neurologist for her headaches.  Past Medical History:  Diagnosis Date  . Abdominal pain 01/02/2012   Overview:  Overview:  Per Previous pcp note- Dr. Ta--Pt has history of endometriosis and had DUB in 2011.  She She had u/s that showed normal endometrial stripe.  She had endometrial bx that was wnl.  She has a lot of abd cramping every month.  This cramping was sometimes not relieved with hydrocodone.  She was referred to [facility] for IUD placement to help endometriosis and abd cramping.  [name]  . Abdominal pain 01/02/2012   Overview:  Overview:  Per Previous pcp note- Dr. Ta--Pt has history of endometriosis and had DUB in 2011.  She She had u/s that showed normal endometrial stripe.  She had endometrial bx that was wnl.  She has a lot of abd cramping every month.  This cramping was sometimes not relieved with hydrocodone.  She was referred to [facility] for IUD placement to help endometriosis and abd cramping.  [name] was placed 04/30/11 by [facility].  Pt still complains of abd cramping, which I am treating with hydrocodone and ultram. Pt reports some  initial relief of pain from IUD, but states it has now returned back to similar to previous cramping. Gave patient a shot of Toradol 60 mg IM today-since positive exacerbation of abdominal cramping during past 2 weeks. Physical exam reassuring. Patient may need to return to [facility] for further workup since she is plugged in with the GYN providers there for further treatment options regarding endometriosis and abdominal cramping. The dysfunctional uterine bleeding now well contr  . Acute cystitis with hematuria   . Acute cystitis without hematuria   . Acute respiratory failure with hypoxia ([name])   . Anemia   . Cellulitis 12/22/2014  . Cerebral palsy ([name])    Spastic Cerebral palsy, mentally intact  . Contracture, joint, multiple sites    Electric wheelchair, uses left hand to operate chair.   . Depression   . Disease of female genital organs 10/24/2010   Overview:  Overview:  Pt has history of endometriosis and had DUB in 2011.  She had u/s that showed normal endometrial stripe.  She had endometrial bx that was wnl.  She has a lot of abd cramping every month.  This cramping was sometimes not relieved with hydrocodone.  She was referred to [facility] for IUD placement to help endometriosis and abd cramping.  [name] was placed 04/30/11 by [facility].  Pt still complains of abd cramping, which was treated with hydrocodone and ultram.   Last seen at [facility] by OB/GYN 12/24/11, given Depo  Lupron injection.  Ordering CT scan to r/o additional cause of abdominal pain (pt with known endometriosis and will not tolerate vaginal U/S, pain not improved s/p [name] or with Lupron injection-- only caused hot flashes and amenorrhea, no pain relief).  Also made referral to pain clinic for better pain control.  Do not think hysterectomy would help with pain, and concern for contamination of baclofen pump if did proceed with surgery.    8/5/13[name]: Pt was seen by   . [name]   .  Dysarthria   . Dyspnea and respiratory abnormality 02/04/2011   Overview:  Overview:  Pt has history of recurrent PE and is on chronic coumadin.  Pt has been complaining of dyspnea that she describes as chest tightness.  She has been to the ER multiple times for this.  Usually she gets CTA and serial CE.  She has had at least 6 CTA in a period of since 2011.  She also has an O2 requirement. It is unclear why pt feels dyspneic, complains of chest tightness, or has O2 requirement.  I thought that her complaint may be from deconditioning and referred her to PT.  Unfortunately Medicaid only pays for 4 PT visits.  Pt should be doing these exercises at home, but she has not.  I have referred her to [facility], and she has been seen by [name].  He did not understand the etiology of her dyspnea and O2 requirement.  He will continue to follow her.   Last Assessment & Plan:  Pt presents with concerns for chest tightness and dyspnea.  She was seen in ED yesterday and discharged home.  While there she had a CT chest 04/20/11 showed 1.  Multifocal degradation as detailed above.  N  . Endometriosis   . GERD (gastroesophageal reflux disease)   . Gram positive sepsis ([name]) 10/24/2014  . Gross hematuria 03/12/2013   Overview:  Overview:  status post replacement of suprapubic tube 03/04/2013  Last Assessment & Plan:  Recent urine culture collected in ED on 5/4 did not result in significant growth. Moreover, patient has been asymptomatic with normal WBC, no fever and no true symptoms making symptomatic UTI less likely.  Will continue to monitor for any sign of infection.  Patient's xarelto had been held during her recent hospitalization due to hematuria. This has since been re-started. Monitor bleeding.    [name] Kitchen HCAP (healthcare-associated pneumonia) 10/25/2014  . HCAP (healthcare-associated pneumonia) 11/24/2017  . History of anticoagulant therapy 10/21/2012  . History of endometriosis 10/2012   OBGYN [facility]: Laparoscopic Endometrial  Ablation, TVH,  . History of recurrent UTIs   . Hypertension   . HYPERTENSION, BENIGN 01/31/2010   Qualifier: Diagnosis of  By: [name], MD, [name] Incontinent of feces 03/11/2016  . Infantile cerebral palsy ([name]) 01/29/2007   Overview:  She has spastic CP.  She is mentally intact.  She is wheelchair bound.  She is wheelchair bound for ambulation      . Intracervical pessary 05/07/2011   Overview:  Overview:  Placed by [facility]YN 04/30/11 to treat DUB and Endometriosis.  She is seen at GYN clinic at [facility] but was considered a poor surgical candidate and referred her to [facility].  For DUB she had pelvic ultrasound that showed thin stripe and she had endometrial Bx by Dr Jennette Kettle in GYN clinic, which was negative.     . Macroscopic hematuria 03/10/2013   status post replacement of  suprapubic tube 03/04/2013   . Migraine   . OCD (obsessive compulsive disorder)   . Parapneumonic effusion 10/25/2014  . Presence of intrathecal baclofen pump 03/07/2014   R LQ. Insertion T10.    [name] Kitchen Psychiatric illness 10/31/2014  . Pulmonary embolism ([name])    Lifetime Coumadin  . Pulmonary embolism ([name]) 2011, 01/2011   Will be on lifetime coumadin  . Recurrent pulmonary embolism ([name]) 05/03/2011   Pt has history of recurrent PE and is on chronic coumadin.  Pt has been complaining of dyspnea that she describes as chest tightness.  She has been to the ER multiple times for this.  Usually she gets CTA and serial CE.  She has had at least 6 CTA in a period of since 2011.  She also has an O2 requirement. It is unclear why pt feels dyspneic, complains of chest tightness, or has O2 requirement.  I thought that her complaint may be from deconditioning and referred her to PT.  Unfortunately Medicaid only pays for 4 PT visits.  Pt should be doing these exercises at home, but she has not.  I have referred her to [facility], and she has been seen by [name].  He did not understand the etiology of her dyspnea and O2 requirement.   He will continue to follow her.     . Seborrheic keratosis 01/18/2011  . Seizures ([name]) 02/12/2016  . Spasticity 01/05/2014  . Transient alteration of awareness, recurrent 09/08/2006   Recurrent episodes where Korra  loses contact with her world and that have been studied thoroughly and appear nonepileptic in nature per Dr Terrace Arabia (neurology) The patient has had episodes starting in 2007 that initially began 1-4 times a day during which time she would have periods of unawareness followed by confusion. This continuous video EEG monitoring performed from October 8-12, 2007. Had   . Urethra dilated and patulous 07/03/2015   Patulous, Dilated Urethra. 5mL balloon on a 22-Fr catheter hoping this would prevent the bladder spasm from pushing the balloon down the urethra (Dr Logan Bores, Urology [facility], July 2016)     Patient Active Problem List   Diagnosis Date Noted  . Migraine without aura and without status migrainosus, not intractable 06/26/2018  . UTI (urinary tract infection) 05/25/2018  . Headache 05/25/2018  . Incontinent of feces 03/11/2016  . Chronic anticoagulation 03/11/2016  . Involuntary movements 02/22/2016  . Seizures ([name]) 02/12/2016  . Dyspnea 02/12/2016  . Cerebral palsy, quadriplegic ([name]) 02/06/2016  . Swelling of extremity 01/13/2016  . Fever   . Pressure ulcer 10/09/2015  . Status post insertion of inferior vena caval filter 09/28/2015  . Absence of bladder continence 08/25/2015  . Person living in residential institution 06/14/2015  . Pulmonary hypertension ([name])   . Anemia of chronic disease   . Spasticity 03/15/2015  . Constipation, chronic 10/10/2014  . Athetoid cerebral palsy ([name]) 04/13/2014  . Dystonia 04/12/2014  . UTI (lower urinary tract infection) 03/16/2014  . Presence of intrathecal baclofen pump 03/07/2014  . Dental caries 07/18/2013  . Multiple thyroid nodules 05/20/2013  . Osteoarthrosis 04/07/2013  . [name]   . Dysarthria   . GERD  (gastroesophageal reflux disease)   . Neurogenic bladder 02/05/2013  . Anxiety state 10/21/2012  . Chronic pain syndrome 06/17/2012  . Recurrent pulmonary embolism ([name]) 05/03/2011  . Pulmonary embolism with infarction ([name]) 05/03/2011  . Chronic suprapubic catheter ([name]) 03/15/2011  . Disease of female genital organs 10/24/2010  . HYPERTENSION, BENIGN 01/31/2010  .  CP (cerebral palsy), spastic ([name]) 12/27/2008  . Contracted, joint, multiple sites 12/27/2008  . Major depressive disorder, recurrent episode ([name]) 01/29/2007  . Obsessive Compulsive Disorder 01/29/2007  . Transient alteration of awareness, recurrent 09/08/2006    Past Surgical History:  Procedure Laterality Date  . ABDOMINAL HYSTERECTOMY    . APPENDECTOMY    . BACLOFEN PUMP REFILL     x 3 times  . CARPAL TUNNEL RELEASE  08/2008   Dr Teressa Senter  . CESAREAN SECTION     x 2  . CHOLECYSTECTOMY  10/14/2015   Procedure: LAPAROSCOPIC CHOLECYSTECTOMY;  Surgeon: Violeta Gelinas, MD;  [facility] OR;  Service: General;;  . COLPOSCOPY  06/2000  . INTRAUTERINE DEVICE INSERTION  04/30/11   Inserted by [facility] GYN for endometriosis  . LAPAROSCOPIC ASSISTED VAGINAL HYSTERECTOMY  10/27/2012  . MULTIPLE EXTRACTIONS WITH ALVEOLOPLASTY Bilateral 07/19/2013   Procedure: EXTRACTIONS #4, 1,61,09,60;  Surgeon: Francene Finders, DDS;  Location: [facility] OR;  Service: Oral Surgery;  Laterality: Bilateral;  . NEUROMA SURGERY Left    anterior and posterior intersosseous  . PAIN PUMP IMPLANTATION N/A 03/07/2014   Procedure: baclofen pump revision/replacement and Catheter connection replacement;  Surgeon: Cristi Loron, MD;  Location: [facility] OR[facility];  Service: Neurosurgery;  Laterality: N/A;  baclofen pump revision/replacement and Catheter connection replacement  . PROGRAMABLE BACLOFEN PUMP REVISION  03/17/14   Battery Replacement   . TUBAL LIGATION  2003  . URETHRA SURGERY    . WRIST SURGERY  06/2010   Dr Dierdre Searles, hand surgeon, [name]     OB  History   None      Home Medications    Prior to Admission medications   Medication Sig Start Date End Date Taking? Authorizing Provider  amantadine (SYMMETREL) 100 MG capsule Take 100 mg by mouth daily.    Yes [provider]  baclofen (LIORESAL) 20 MG tablet Take 20 mg by mouth 4 (four) times daily. For bladder spasms   Yes [provider]  Buprenorphine HCl (BELBUCA) 450 MCG FILM Place 450 mg inside cheek every 12 (twelve) hours.   Yes [provider]  busPIRone (BUSPAR) 5 MG tablet Take 5 mg by mouth 3 (three) times daily.   Yes [provider]  butalbital-acetaminophen-caffeine (FIORICET, ESGIC) 50-325-40 MG tablet Take 2 tablets by mouth every 4 (four) hours as needed for headache (not controlled by Tylenol). 05/29/18  Yes Hall, Carole N, DO  clonazePAM (KLONOPIN) 0.5 MG tablet Take 1.5 tablets (0.75 mg total) by mouth 3 (three) times daily. 11/27/17  Yes Narda Bonds, MD  feeding supplement, ENSURE ENLIVE, (ENSURE ENLIVE) LIQD Take 237 mLs by mouth 2 (two) times daily between meals. 05/30/18  Yes Dow Adolph N, DO  ferrous sulfate 325 (65 FE) MG tablet Take 325 mg by mouth daily.    Yes [provider]  FLUoxetine (PROZAC) 40 MG capsule Take 80 mg by mouth daily.   Yes [provider]  lamoTRIgine (LAMICTAL) 100 MG tablet Take 100 mg by mouth at bedtime.   Yes [provider]  linaclotide (LINZESS) 145 MCG CAPS capsule Take 145 mcg by mouth daily before breakfast.   Yes [provider]  Melatonin 5 MG TABS Take 5 mg by mouth at bedtime.    Yes [provider]  Multiple Vitamins-Minerals (CERTA-VITE PO) Take 1 tablet by mouth daily.   Yes [provider]  OVER THE COUNTER MEDICATION Take 1 Package by mouth daily. Magic cup    Yes [provider]  oxybutynin (DITROPAN) 5 MG tablet Take 5 mg by mouth 4 (four) times daily.  02/20/16  Yes [provider]  oxyCODONE (ROXICODONE) 5 MG  immediate release tablet Take 5 mg by mouth every 4 (four) hours as needed for moderate pain or severe pain.   Yes [provider]  polyethylene glycol powder (GLYCOLAX/MIRALAX) powder Take 1  1/2 dose  daily in 12 ounces of fluid every day. Patient taking differently: Take 17 g by mouth daily. Mix in 8 ounces of liquid. 01/01/17  Yes Esterwood, [name] S, PA-C  QUEtiapine (SEROQUEL) 50 MG tablet Take 150 mg by mouth at bedtime.   Yes [provider]  tiZANidine (ZANAFLEX) 4 MG capsule Take 4 mg by mouth 2 (two) times daily.    Yes [provider]  warfarin (COUMADIN) 1 MG tablet Take 0.5 mg by mouth daily.   Yes [provider]  warfarin (COUMADIN) 4 MG tablet Take 4 mg by mouth daily.   Yes [provider]  warfarin (COUMADIN) 5 MG tablet Take 5 mg by mouth daily.   Yes [provider]  acetaminophen (TYLENOL 8 HOUR) 650 MG CR tablet Take 1 tablet (650 mg total) by mouth every 8 (eight) hours as needed. 07/08/18   Derwood Kaplan, MD  bisacodyl (DULCOLAX) 10 MG suppository Place 10 mg rectally as needed (For constipation.).    [provider]  lactulose, encephalopathy, (ENULOSE) 10 GM/15ML SOLN Take 20 g by mouth daily as needed (For constipation.).    [provider]  lubiprostone (AMITIZA) 24 MCG capsule Take 1 capsule (24 mcg total) by mouth 2 (two) times daily with a meal. Patient not taking: Reported on 07/07/2018 01/01/17   Esterwood, [name] S, PA-C  warfarin (COUMADIN) 6 MG tablet Take 1 tablet (6 mg total) by mouth daily. Patient not taking: Reported on 07/07/2018 06/12/18   Molpus, Jonny Ruiz, MD    Family History Family History  Problem Relation Age of Onset  . Asthma Father   . Colon cancer Maternal Grandmother        Died in her 58's  . Breast cancer Paternal Grandmother        Died in her 24's  . Heart attack Maternal Grandfather        Died in his 40's  . Alzheimer's disease Paternal Grandfather        Died in his 77's  .  Breast cancer Mother   . Stomach cancer Neg Hx     Social History Social History   Tobacco Use  . Smoking status: Never Smoker  . Smokeless tobacco: Never Used  Substance Use Topics  . Alcohol use: Yes    Alcohol/week: 0.6 oz    Types: 1 Glasses of wine per week    Comment: Drinks alcohol once a month.  . Drug use: No     Allergies   Morphine   Review of Systems Review of Systems  Constitutional: Positive for activity change.  Eyes: Negative for visual disturbance.  Cardiovascular: Negative for chest pain.  Gastrointestinal: Negative for nausea and vomiting.  Neurological: Positive for headaches. Negative for weakness and numbness.     Physical Exam Updated Vital Signs BP 117/76   Pulse 69   Temp 98.1 F (36.7 C) (Oral)   Resp 18   LMP 04/05/2011   SpO2 95%   Physical Exam  Constitutional: She appears well-developed.  HENT:  Head: Normocephalic and atraumatic.  Eyes: EOM are normal.  Neck: Normal range of  motion. Neck supple.  Cardiovascular: Normal rate.  Pulmonary/Chest: Effort normal.  Abdominal: Bowel sounds are normal.  Neurological: She is alert. No sensory deficit.  Skin: Skin is warm and dry.  Nursing note and vitals reviewed.    ED Treatments / Results  Labs (all labs ordered are listed, but only abnormal results are displayed) Labs Reviewed - No data to display  EKG None  Radiology No results found.  Procedures Procedures (including critical care time)  Medications Ordered in ED Medications  diphenhydrAMINE (BENADRYL) injection 25 mg (25 mg Intravenous Given 07/07/18 2256)  dexamethasone (DECADRON) injection 10 mg (10 mg Intravenous Given 07/07/18 2256)  metoCLOPramide (REGLAN) injection 10 mg (10 mg Intravenous Given 07/07/18 2257)  ketorolac (TORADOL) 15 MG/ML injection 10 mg (10 mg Intravenous Given 07/08/18 0005)  metoCLOPramide (REGLAN) injection 10 mg (10 mg Intravenous Given 07/08/18 0006)     Initial Impression / Assessment and  Plan / ED Course  I have reviewed the triage vital signs and the nursing notes.  Pertinent labs & imaging results that were available during my care of the patient were reviewed by me and considered in my medical decision making (see chart for details).  Clinical Course as of Jul 08 45  Tue Jul 07, 2018  1150 Headache has improved significantly but not completely resolved. We will order another round of Toradol and Reglan and reassess.   [AN]  Wed Jul 08, 2018  0044 Upon reassessment, patient reports that the headache has resolved. She continued to have no new neurologic complains. Strict return precautions discussed, pt will return to the ER if there is visual complains, seizures, altered mental status, loss of consciousness, dizziness, new focal weakness, or numbness.       [AN]    Clinical Course User Index [AN] Derwood KaplanNanavati, Mahrukh Seguin, MD    42 year old female comes in with chief complaint of headache.  She has history of migraines versus chronic headache.  She also has history of CP and PE, and is on Coumadin.  Her last 2 visits have been because of headaches, CT head on both times were negative.  Patient denies any acute neurologic symptoms with this headache, and she also states that this is her typical headache -therefore we will not repeat a CT scan of the head as the pretest probability for elevated ICP is extremely low.  Patient's neurologic exam is limited because of her CP, however it does not seem like there is any nystagmus, pupils are equal and reactive and her gross sensory exam is normal -all of which is reassuring.  We will start with Reglan and Benadryl.  We will reassess.  Final Clinical Impressions(s) / ED Diagnoses   Final diagnoses:  Bad headache    ED Discharge Orders        Ordered    acetaminophen (TYLENOL 8 HOUR) 650 MG CR tablet  Every 8 hours PRN,   Status:  Discontinued     07/08/18 0042    acetaminophen (TYLENOL 8 HOUR) 650 MG CR tablet  Every 8 hours  PRN     07/08/18 0043       Derwood KaplanNanavati, Tishawna Larouche, MD 07/08/18 [phone]

## 2018-07-07 NOTE — Progress Notes (Signed)
Patient: [name] [name] MRN: [id number] Sex: female DOB: 11/23/1976  Provider: Elveria Rising, NP Location of Care: [facility]  Note type: Routine return visit  History of Present Illness: Referral Source: [name] March, MD History from: patient Chief Complaint: Headaches  [name] [name] is a 42 y.o. 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 She was last seen June 25, 2018 by [name] [name] for management and refill of intrathecal Baclofen pump.  She is seen today because of complaints of severe headaches for about 4 months. [name] [name] tells me that the headaches began slowly but then increased to be very severe and intractable. She was seen in the hospital around that time for urinary tract infection, and also had a severe headache. [name] [name] says that she was prescribed Fioricet and Oxycodone and that she has been taking these medications every 4 hours while awake since then. [name] [name] says that the headaches are frontal, sometimes radiating to the back of her head. She has nausea but doesn't usually vomit and typically has intolerance to light and noise. She says that she wakes with the headache and that it persists all day. She says that the Fioricet and Oxycodone dull the headache to a tolerable level but does not abort the headache.   [name] [name] says that she sleeps 8-10 hours each night. She occasionally skips meals if she does not like what is being served at her nursing facility. [name] [name] says that she drinks sweet tea all day and rarely drinks water. She believes that the tea is decaffeinated. [name] [name] admits to considerable stress in her life. She says that her mother has breast cancer and is not doing well. She says that her daughter [name] [name] has been placed in a group home in [name] because of behavior. She last saw [name] [name] about a month ago. These things are very distressing to [name] [name].   [name] [name] sees a counselor at least once per month and a psychiatrist  every 3 months. She is taking Buspar and Fluoxetine for her mood disorder.   [name] [name] says that she has been otherwise healthy since she was last seen. She has no other health concerns today other than previously mentioned.  Review of Systems: Please see the HPI for neurologic and other pertinent review of systems. Otherwise, all other systems were reviewed and were negative.    Past Medical History:  Diagnosis Date  . Abdominal pain 01/02/2012   Overview:  Overview:  Per Previous pcp note- [name] [name]--Pt has history of endometriosis and had DUB in 2011.  She She had u/s that showed normal endometrial stripe.  She had endometrial bx that was wnl.  She has a lot of abd cramping every month.  This cramping was sometimes not relieved with hydrocodone.  She was referred to [name] [name] for IUD placement to help endometriosis and abd cramping.  [name]  . Abdominal pain 01/02/2012   Overview:  Overview:  Per Previous pcp note- [name] [name]--Pt has history of endometriosis and had DUB in 2011.  She She had u/s that showed normal endometrial stripe.  She had endometrial bx that was wnl.  She has a lot of abd cramping every month.  This cramping was sometimes not relieved with hydrocodone.  She was [name] for IUD placement to help endometriosis and abd cramping.  [name] was placed 04/30/11 by [name] [name].  Pt still complains of abd cramping, which I am treating with hydrocodone and ultram. Pt reports some initial relief of pain from IUD,  but states it has now returned back to similar to previous cramping. Gave patient a shot of Toradol 60 mg IM today-since positive exacerbation of abdominal cramping during past 2 weeks. Physical exam reassuring. Patient may need to [name] workup since she is plugged in with the GYN providers there for further treatment options regarding endometriosis and abdominal cramping. The dysfunctional uterine bleeding now well contr  . Acute cystitis with hematuria     . Acute cystitis without hematuria   . Acute respiratory failure with hypoxia ([name])   . Anemia   . Cellulitis 12/22/2014  . Cerebral palsy ([name])    Spastic Cerebral palsy, mentally intact  . Contracture, joint, multiple sites    Electric wheelchair, uses left hand to operate chair.   . Depression   . Disease of female genital organs 10/24/2010   Overview:  Overview:  Pt has history of endometriosis and had DUB in 2011.  She had u/s that showed normal endometrial stripe.  She had endometrial bx that was wnl.  She has a lot of abd cramping every month.  This cramping was sometimes not relieved with hydrocodone.  [facility] to [facility] for IUD placement to help endometriosis and abd cramping.  [name]/11 by [facility].  Pt still complains of abd cramping, which was treated with hydrocodone and ultram.   [facility] at [name] by OB/GYN 12/24/11, given Depo Lupron injection.  Ordering CT scan to r/o additional cause of abdominal pain (pt with known endometriosis and will not tolerate vaginal U/S, pain not improved s/p [name] or with Lupron injection-- only caused hot flashes and amenorrhea, no pain relief).  Also made referral to pain clinic for better pain control.  Do not think hysterectomy would help with pain, and concern for contamination of baclofen pump if did proceed with surgery.    8/5/13Marilynne [name]: [facility]: Pt was seen by   . [name]   . Dysarthria   . Dyspnea and respiratory abnormality 02/04/2011   Overview:  Overview:  Pt has history of recurrent PE and is on chronic coumadin.  Pt has been complaining of dyspnea that she describes as chest tightness.  She has been to the ER multiple times for this.  Usually she gets CTA and serial CE.  She has had at least 6 CTA in a period of since 2011.  She also has an O2 requirement. It is unclear why pt feels dyspneic, complains of chest tightness, or has O2 requirement.  I thought that her complaint may be from  deconditioning and referred her to PT.  Unfortunately Medicaid only pays for 4 PT visits.  Pt should be doing these exercises at home, but she has not.  [facility] and she has been seen by [name] Marchelle Gearingamaswamy.  He did not understand the etiology of her dyspnea and O2 requirement.  He will continue to follow her.   Last Assessment & Plan:  Pt presents with concerns for chest tightness and dyspnea.  She was seen in ED yesterday and discharged home.  While there she had a CT chest 04/20/11 showed 1.  Multifocal degradation as detailed above.  N  . Endometriosis   . GERD (gastroesophageal reflux disease)   . Gram positive sepsis ([name]) 10/24/2014  . Gross hematuria 03/12/2013   Overview:  Overview:  status post replacement of suprapubic tube 03/04/2013  Last Assessment & Plan:  Recent urine culture collected in ED  on 5/4 did not result in significant growth. Moreover, patient has been asymptomatic with normal WBC, no fever and no true symptoms making symptomatic UTI less likely.  Will continue to monitor for any sign of infection.  Patient's xarelto had been held during her recent hospitalization due to hematuria. This has since been re-started. Monitor bleeding.    [name] Kitchen HCAP (healthcare-associated pneumonia) 10/25/2014  . HCAP (healthcare-associated pneumonia) 11/24/2017  . History of anticoagulant therapy 10/21/2012  . History of endometriosis 10/2012   OBGYN [facility]: Laparoscopic Endometrial Ablation, TVH,  . History of recurrent UTIs   . Hypertension   . HYPERTENSION, BENIGN 01/31/2010   Qualifier: Diagnosis of  By: [name], MD, [name] Incontinent of feces 03/11/2016  . Infantile cerebral palsy ([name]) 01/29/2007   Overview:  She has spastic CP.  She is mentally intact.  She is wheelchair bound.  She is wheelchair bound for ambulation      . Intracervical pessary 05/07/2011   Overview:  Overview:  Placed by [name] 04/30/11 to treat DUB and Endometriosis.  She is seen at GYN  clinic at [name] but was considered a poor surgical candidate and referred her to [facility].  For DUB she had pelvic ultrasound that showed thin stripe and she had endometrial Bx by [name] Jennette Kettle in GYN clinic, which was negative.     . Macroscopic hematuria 03/10/2013   status post replacement of suprapubic tube 03/04/2013   . Migraine   . OCD (obsessive compulsive disorder)   . Parapneumonic effusion 10/25/2014  . Presence of intrathecal baclofen pump 03/07/2014   R LQ. Insertion T10.    [name] Kitchen Psychiatric illness 10/31/2014  . Pulmonary embolism ([name])    Lifetime Coumadin  . Pulmonary embolism ([name]) 2011, 01/2011   Will be on lifetime coumadin  . Recurrent pulmonary embolism ([name]) 05/03/2011   Pt has history of recurrent PE and is on chronic coumadin.  Pt has been complaining of dyspnea that she describes as chest tightness.  She has been to the ER multiple times for this.  Usually she gets CTA and serial CE.  She has had at least 6 CTA in a period of since 2011.  She also has an O2 requirement. It is unclear why pt feels dyspneic, complains of chest tightness, or has O2 requirement.  I thought that her complaint may be from deconditioning and referred her to PT.  Unfortunately Medicaid only pays for 4 PT visits.  Pt should be doing these exercises at home, but she has not.  I have referred her to [facility], and she has been seen by [name] Marchelle Gearing.  He did not understand the etiology of her dyspnea and O2 requirement.  He will continue to follow her.     . Seborrheic keratosis 01/18/2011  . Seizures ([name]) 02/12/2016  . Spasticity 01/05/2014  . Transient alteration of awareness, recurrent 09/08/2006   Recurrent episodes where Malayiah  loses contact with her world and that have been studied thoroughly and appear nonepileptic in nature per [name] [place] (neurology) The patient has had episodes starting in 2007 that initially began 1-4 times a day during which time she would have periods of unawareness followed by confusion. This continuous  video EEG monitoring performed from October 8-12, 2007. Had   . Urethra dilated and patulous 07/03/2015   Patulous, Dilated Urethra. 5mL balloon on a 22-Fr catheter hoping this would prevent the bladder spasm from pushing the balloon down the urethra ([name] Logan Bores, Urology [facility],  July 2016)    Hospitalizations: No., Head Injury: No., Nervous System Infections: No., Immunizations up to date: Yes.   Past Medical History Comments: See HPI  Birth History Extreme prematurity  Behavior History Anxiety and depression  Surgical History Past Surgical History:  Procedure Laterality Date  . ABDOMINAL HYSTERECTOMY    . APPENDECTOMY    . BACLOFEN PUMP REFILL     x 3 times  . CARPAL TUNNEL RELEASE  08/2008   [name] Teressa Senter  . CESAREAN SECTION     x 2  . CHOLECYSTECTOMY  10/14/2015   Procedure: LAPAROSCOPIC CHOLECYSTECTOMY;  Surgeon: Violeta Gelinas, MD;  Location[facility] OR;  Service: General;;  . COLPOSCOPY  06/2000  . INTRAUTERINE DEVICE INSERTION  04/30/11   Inserted by [name] for endometriosis  . LAPAROSCOPIC ASSISTED VAGINAL HYSTERECTOMY  10/27/2012  . MULTIPLE EXTRACTIONS WITH ALVEOLOPLASTY Bilateral 07/19/2013   Procedure: EXTRACTIONS #4, 8,11,91,47;  Surgeon: Francene Finders, DDS;  Location: [facility] OR;  Service: Oral Surgery;  Laterality: Bilateral;  . NEUROMA SURGERY Left    anterior and posterior intersosseous  . PAIN PUMP IMPLANTATION N/A 03/07/2014   Procedure: baclofen pump revision/replacement and Catheter connection replacement;  Surgeon: Cristi Loron, MD;  Location: [facility] OR[facility];  Service: Neurosurgery;  Laterality: N/A;  baclofen pump revision/replacement and Catheter connection replacement  . PROGRAMABLE BACLOFEN PUMP REVISION  03/17/14   Battery Replacement   . TUBAL LIGATION  2003  . URETHRA SURGERY    . WRIST SURGERY  06/2010   [name] Dierdre Searles, hand surgeon, [name] [name]    Family History family history includes Alzheimer's disease in her paternal grandfather; Asthma in her father;  Breast cancer in her mother and paternal grandmother; Colon cancer in her maternal grandmother; Heart attack in her maternal grandfather. Family History is otherwise negative for migraines, seizures, cognitive impairment, blindness, deafness, birth defects, chromosomal disorder, autism.  Social History Social History   Socioeconomic History  . Marital status: Divorced    Spouse name: Not on file  . Number of children: 2  . Years of education: college  . Highest education level: Not on file  Occupational History    Employer: UNEMPLOYED  Social Needs  . Financial resource strain: Not on file  . Food insecurity:    Worry: Not on file    Inability: Not on file  . Transportation needs:    Medical: Not on file    Non-medical: Not on file  Tobacco Use  . Smoking status: Never Smoker  . Smokeless tobacco: Never Used  Substance and Sexual Activity  . Alcohol use: Yes    Alcohol/week: 1.0 standard drinks    Types: 1 Glasses of wine per week    Comment: Drinks alcohol once a month.  . Drug use: No  . Sexual activity: Never  Lifestyle  . Physical activity:    Days per week: Not on file    Minutes per session: Not on file  . Stress: Not on file  Relationships  . Social connections:    Talks on phone: Not on file    Gets together: Not on file    Attends religious service: Not on file    Active member of club or organization: Not on file    Attends meetings of clubs or organizations: Not on file    Relationship status: Not on file  Other Topics Concern  . 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  She enjoys shopping.   Lives in a nursing facility[name] since 02/11[facility]16      Ailene Ards, who has schizophrenia, MR, is father of daughter.  Homero Fellers and pt are no longer together.  Homero Fellers does not see [name]. Daughter is 11 yo, Levy Sjogren, lives in a group home in [place].     [name]'s mother reportedly has breast cancer.    Needs assistance with ADL's and IADL's--has personal care services 20 hrs/wk;       No tobacco, EtOH, drugs.      **Case Manager: Jack Quarto [phone]      [facility] lives [name]    Allergies Allergies  Allergen Reactions  . Morphine Dermatitis and Other (See Comments)    Skin turned red    Physical Exam BP 108/84   Pulse 78   LMP 04/05/2011  General: thin woman, seated in wheelchair, in no evident distress; graying black hair, brown eyes, left handed Head: normocephalic and atraumatic. Oropharynx benign. No dysmorphic features. She is unable to keep her head midline Neck: supple with no carotid bruits. Cardiovascular: regular rate and rhythm, no murmurs. Respiratory: Clear to auscultation bilaterally Abdomen: Bowel sounds present all four quadrants, abdomen soft, non-tender, non-distended. No hepatosplenomegaly or masses palpated. Musculoskeletal: No skeletal deformities. Has neuromuscular scoliosis. Contractures at both wrists. The right wrist and hand is in a splint to keep in a neutral position. She is able to extend her left wrist to neutral. Flexion contractures at her hips, markedly increased hip adductor tone, contractures at the ankles with tight heel cords. Skin: no rashes or neurocutaneous lesions  Neurologic Exam Mental Status: Awake and fully alert. Complains of headache of 3-4 on scale of 0-5. Knowledge is normal for age. Language is normal. She has dysarthria and is difficult to understand because of severe oral motor apraxia.  Cranial Nerves: Fundoscopic exam - red reflex present.  Unable to fully visualize fundus.  Pupils equal briskly reactive to light. Extraocular movements and visual fields are normal. Hearing is intact and symmetric. Facial movements are asymmetric, has lower facial weakness with mild drooling. Only able to slightly protrude her tongue to midline and to the right. Neck flexion and  extension abnormal with poor head control.  Motor: Spastic dystonic quadriparesis. Right arm is flexed at the elbow. She is not able to fully extend her fingers. She is able to manipulate the joystick on her wheelchair with her left hand. She can minimally abduct her legs at the hips.  Sensory: Intact to touch and temperature Coordination: Unable to adequately assess due to patient's inability to participate in examination. No dysmetria when reaching for objects. Gait and Station: In a power wheelchair. I did not get her up to standing position today. Reflexes: Diminished and symmetric. Toes neutral. No clonus  Impression 1.  Spastic/athetoid quadriparesis 2.  Migraine without aura, chronic 3.  Tension headaches 4.  Dysarthria with oral motor apraxia 5.  History of extreme prematurity   Recommendations for plan of care The patient's previous [facility] records were reviewed. Shayonna has neither had nor required imaging or lab studies since the last visit. She is a 42 year old woman with history of spastic/athetoid quadriparesis, dysarthria, oral motor apraxia, migraine and tension headaches. She was seen today for evaluation of ongoing headaches. I talked with [name] [name] today and explained that I am very concerned that she has not only migraines but medication rebound headaches because of her daily use of Fioricet and Oxycodone. I explained to  her that I am very hesitant to add any medication to her regimen because of her large medication list. I did tell her that she should try to reduce the narcotic medications over time. I also talked with her about her consumption of only sweet tea and recommended that she try to drink more water, as well as avoiding skipped meals. I told [name] [name] that I will call her health care providers at her nursing facility tomorrow to discuss a treatment plan. [name] [name] was understandably unhappy with this and wanted a treatment plan established today. I explained to her that I will only  prescribe medication after talking with her primary health care providers and she agreed with the plan.   The medication list was reviewed and reconciled.  No changes were made in the prescribed medications today.  A complete medication list was provided to the patient.  Allergies as of 07/07/2018      Reactions   Morphine Dermatitis, Other (See Comments)   Skin turned red      Medication List        Accurate as of 07/07/18 11:59 PM. Always use your most recent med list.          acetaminophen 650 MG CR tablet Commonly known as:  TYLENOL Take 1 tablet (650 mg total) by mouth every 8 (eight) hours as needed.   acetaminophen 650 MG CR tablet Commonly known as:  TYLENOL Take 1 tablet (650 mg total) by mouth every 8 (eight) hours as needed.   amantadine 100 MG capsule Commonly known as:  SYMMETREL Take 100 mg by mouth daily.   baclofen 20 MG tablet Commonly known as:  LIORESAL Take 20 mg by mouth 4 (four) times daily. For bladder spasms   BELBUCA 450 MCG Film Generic drug:  Buprenorphine HCl Place 450 mg inside cheek every 12 (twelve) hours.   bisacodyl 10 MG suppository Commonly known as:  DULCOLAX Place 10 mg rectally as needed (For constipation.).   busPIRone 5 MG tablet Commonly known as:  BUSPAR Take 5 mg by mouth 3 (three) times daily.   butalbital-acetaminophen-caffeine 50-325-40 MG tablet Commonly known as:  FIORICET, ESGIC Take 2 tablets by mouth every 4 (four) hours as needed for headache (not controlled by Tylenol).   CERTA-VITE PO Take 1 tablet by mouth daily.   clonazePAM 0.5 MG tablet Commonly known as:  KLONOPIN Take 1.5 tablets (0.75 mg total) by mouth 3 (three) times daily.   ENULOSE 10 GM/15ML Soln Generic drug:  lactulose (encephalopathy) Take 20 g by mouth daily as needed (For constipation.).   feeding supplement (ENSURE ENLIVE) Liqd Take 237 mLs by mouth 2 (two) times daily between meals.   ferrous sulfate 325 (65 FE) MG tablet Take 325 mg  by mouth daily.   FLUoxetine 40 MG capsule Commonly known as:  PROZAC Take 80 mg by mouth daily.   lamoTRIgine 100 MG tablet Commonly known as:  LAMICTAL Take 100 mg by mouth at bedtime.   linaclotide 145 MCG Caps capsule Commonly known as:  LINZESS Take 145 mcg by mouth daily before breakfast.   lubiprostone 24 MCG capsule Commonly known as:  AMITIZA Take 1 capsule (24 mcg total) by mouth 2 (two) times daily with a meal.   Melatonin 5 MG Tabs Take 5 mg by mouth at bedtime.   OVER THE COUNTER MEDICATION Take 1 Package by mouth daily. Magic cup   oxybutynin 5 MG tablet Commonly known as:  DITROPAN Take 5 mg by mouth 4 (four)  times daily.   polyethylene glycol powder powder Commonly known as:  GLYCOLAX/MIRALAX Take 1  1/2 dose  daily in 12 ounces of fluid every day.   QUEtiapine 50 MG tablet Commonly known as:  SEROQUEL Take 150 mg by mouth at bedtime.   ROXICODONE 5 MG immediate release tablet Generic drug:  oxyCODONE Take 5 mg by mouth every 4 (four) hours as needed for moderate pain or severe pain.   tiZANidine 4 MG capsule Commonly known as:  ZANAFLEX Take 4 mg by mouth 2 (two) times daily.   warfarin 5 MG tablet Commonly known as:  COUMADIN Take as directed by the anticoagulation clinic. If you are unsure how to take this medication, talk to your nurse or doctor. Original instructions:  Take 5 mg by mouth daily.   warfarin 4 MG tablet Commonly known as:  COUMADIN Take as directed by the anticoagulation clinic. If you are unsure how to take this medication, talk to your nurse or doctor. Original instructions:  Take 4 mg by mouth daily.   warfarin 1 MG tablet Commonly known as:  COUMADIN Take as directed by the anticoagulation clinic. If you are unsure how to take this medication, talk to your nurse or doctor. Original instructions:  Take 0.5 mg by mouth daily.   warfarin 6 MG tablet Commonly known as:  COUMADIN Take as directed by the anticoagulation  clinic. If you are unsure how to take this medication, talk to your nurse or doctor. Original instructions:  Take 1 tablet (6 mg total) by mouth daily.       [name]. Sharene [name] was consulted regarding the patient.   Total time spent with the patient was 45 minutes, of which 50% or more was spent in counseling and coordination of care.   [name] Rising NP-C

## 2018-07-08 ENCOUNTER — Telehealth (INDEPENDENT_AMBULATORY_CARE_PROVIDER_SITE_OTHER): Payer: Self-pay | Admitting: Family

## 2018-07-08 DIAGNOSIS — R51 Headache: Secondary | ICD-10-CM | POA: Diagnosis not present

## 2018-07-08 DIAGNOSIS — Z7401 Bed confinement status: Secondary | ICD-10-CM | POA: Diagnosis not present

## 2018-07-08 DIAGNOSIS — M2550 Pain in unspecified joint: Secondary | ICD-10-CM | POA: Diagnosis not present

## 2018-07-08 DIAGNOSIS — R0902 Hypoxemia: Secondary | ICD-10-CM | POA: Diagnosis not present

## 2018-07-08 DIAGNOSIS — G8250 Quadriplegia, unspecified: Secondary | ICD-10-CM | POA: Diagnosis not present

## 2018-07-08 DIAGNOSIS — M24541 Contracture, right hand: Secondary | ICD-10-CM | POA: Diagnosis not present

## 2018-07-08 DIAGNOSIS — M6281 Muscle weakness (generalized): Secondary | ICD-10-CM | POA: Diagnosis not present

## 2018-07-08 MED ORDER — ACETAMINOPHEN ER 650 MG PO TBCR: 650.0000 mg | EXTENDED_RELEASE_TABLET | Freq: Three times a day (TID) | ORAL | 0 refills | Status: DC | PRN

## 2018-07-08 NOTE — Discharge Instructions (Signed)
We saw you in the ER for headaches. We are not sure what is causing your headaches, however, there appears to be no evidence of infection, bleeds or tumors based on our exam.  Please take tylenol round the clock for the next 2 days, See your doctor if the pain persists, as you might need better medications or a specialist.

## 2018-07-08 NOTE — ED Notes (Signed)
[facility] here to transport pt back to [facility].

## 2018-07-08 NOTE — ED Notes (Signed)
Staff at [facility] was given report on pt's condition and discharge instructions and prescription.

## 2018-07-08 NOTE — ED Notes (Signed)
Bed: [facility] Expected date:  Expected time:  Means of arrival:  Comments: 

## 2018-07-08 NOTE — Telephone Encounter (Signed)
I received a message from the front desk that [name] [name] wanted me to call her. I called [name] [name] and she said that she tried to wait an extra hour before taking pain medication yesterday afternoon and that the headache intensified to the point that the headache was intolerable and she went to ER from her nursing facility. There she received IV Benadryl, Reglan, Decadron and Toradol and had relief of headache. She said that the headache returned this morning when she awakened. [name] [name] asked if she could try Topamax for migraine prevention to see if that would help her to taper off of the pain medications. I told [name] [name] that I was reluctant to add another medication to her regimen without talking with her providers at the facility. I tried to call there today but was told that there was no provider available. [name] [name] told me that there would be an NP - [name] [name] - there on Friday. I told [name] [name] that I would call on Friday to talk with [name] [name]. TG

## 2018-07-08 NOTE — ED Notes (Signed)
PTAR was called for pt's transportation back to [facility].

## 2018-07-08 NOTE — Telephone Encounter (Signed)
This is worth a trial if her caregivers agree..[name] Kitchen

## 2018-07-09 ENCOUNTER — Encounter (INDEPENDENT_AMBULATORY_CARE_PROVIDER_SITE_OTHER): Payer: Self-pay | Admitting: Family

## 2018-07-09 DIAGNOSIS — G8250 Quadriplegia, unspecified: Secondary | ICD-10-CM | POA: Diagnosis not present

## 2018-07-09 DIAGNOSIS — M24541 Contracture, right hand: Secondary | ICD-10-CM | POA: Diagnosis not present

## 2018-07-09 DIAGNOSIS — M6281 Muscle weakness (generalized): Secondary | ICD-10-CM | POA: Diagnosis not present

## 2018-07-09 NOTE — Patient Instructions (Signed)
Thank you for coming in today.   Instructions for you until your next appointment are as follows: 1. Try to reduce your use of narcotic pain medications 2. Try to drink less sweet tea and more water 3. Avoid skipping meals 4.  I will call your primary care provider at the nursing facility tomorrow to discuss a treatment plan. I will call you after doing so

## 2018-07-10 ENCOUNTER — Telehealth (INDEPENDENT_AMBULATORY_CARE_PROVIDER_SITE_OTHER): Payer: Self-pay | Admitting: Family

## 2018-07-10 DIAGNOSIS — M24541 Contracture, right hand: Secondary | ICD-10-CM | POA: Diagnosis not present

## 2018-07-10 DIAGNOSIS — I484 Atypical atrial flutter: Secondary | ICD-10-CM | POA: Diagnosis not present

## 2018-07-10 DIAGNOSIS — G8250 Quadriplegia, unspecified: Secondary | ICD-10-CM | POA: Diagnosis not present

## 2018-07-10 DIAGNOSIS — M6281 Muscle weakness (generalized): Secondary | ICD-10-CM | POA: Diagnosis not present

## 2018-07-10 NOTE — Telephone Encounter (Signed)
I called and spoke with [name] MannanLaToya Williamson NP, [name] [name]'s PCP, and talked with her about [name] [name]'s headaches. We discussed overuse of Fioricet and Oxycodone, and trying Topiramate to see if that will help to reduce headaches and allow her to taper narcotic use. I recommended Topiramate 25mg  at bedtime for 2 weeks, 50mg  at bedtime thereafter. I will see [name] HongJudy in 4 weeks to see how she is doing. LaToya and I also talked about [name] [name]'s weight, as Topiramate can blunt appetite and she said that her weight is being monitored. TG

## 2018-07-13 DIAGNOSIS — M6281 Muscle weakness (generalized): Secondary | ICD-10-CM | POA: Diagnosis not present

## 2018-07-13 DIAGNOSIS — G8250 Quadriplegia, unspecified: Secondary | ICD-10-CM | POA: Diagnosis not present

## 2018-07-13 DIAGNOSIS — I82402 Acute embolism and thrombosis of unspecified deep veins of left lower extremity: Secondary | ICD-10-CM | POA: Diagnosis not present

## 2018-07-13 DIAGNOSIS — M24541 Contracture, right hand: Secondary | ICD-10-CM | POA: Diagnosis not present

## 2018-07-14 DIAGNOSIS — N3941 Urge incontinence: Secondary | ICD-10-CM | POA: Diagnosis not present

## 2018-07-14 DIAGNOSIS — G800 Spastic quadriplegic cerebral palsy: Secondary | ICD-10-CM | POA: Diagnosis not present

## 2018-07-14 DIAGNOSIS — N319 Neuromuscular dysfunction of bladder, unspecified: Secondary | ICD-10-CM | POA: Diagnosis not present

## 2018-07-14 DIAGNOSIS — Z9359 Other cystostomy status: Secondary | ICD-10-CM | POA: Diagnosis not present

## 2018-07-15 DIAGNOSIS — R5383 Other fatigue: Secondary | ICD-10-CM | POA: Diagnosis not present

## 2018-07-15 DIAGNOSIS — M6281 Muscle weakness (generalized): Secondary | ICD-10-CM | POA: Diagnosis not present

## 2018-07-15 DIAGNOSIS — M1990 Unspecified osteoarthritis, unspecified site: Secondary | ICD-10-CM | POA: Diagnosis not present

## 2018-07-15 DIAGNOSIS — M79641 Pain in right hand: Secondary | ICD-10-CM | POA: Diagnosis not present

## 2018-07-15 DIAGNOSIS — M24541 Contracture, right hand: Secondary | ICD-10-CM | POA: Diagnosis not present

## 2018-07-15 DIAGNOSIS — M85841 Other specified disorders of bone density and structure, right hand: Secondary | ICD-10-CM | POA: Diagnosis not present

## 2018-07-15 DIAGNOSIS — M24549 Contracture, unspecified hand: Secondary | ICD-10-CM | POA: Diagnosis not present

## 2018-07-15 DIAGNOSIS — M79642 Pain in left hand: Secondary | ICD-10-CM | POA: Diagnosis not present

## 2018-07-15 DIAGNOSIS — G8250 Quadriplegia, unspecified: Secondary | ICD-10-CM | POA: Diagnosis not present

## 2018-07-15 DIAGNOSIS — R768 Other specified abnormal immunological findings in serum: Secondary | ICD-10-CM | POA: Diagnosis not present

## 2018-07-15 DIAGNOSIS — D8989 Other specified disorders involving the immune mechanism, not elsewhere classified: Secondary | ICD-10-CM | POA: Diagnosis not present

## 2018-07-15 DIAGNOSIS — G809 Cerebral palsy, unspecified: Secondary | ICD-10-CM | POA: Diagnosis not present

## 2018-07-15 DIAGNOSIS — M2550 Pain in unspecified joint: Secondary | ICD-10-CM | POA: Diagnosis not present

## 2018-07-16 DIAGNOSIS — G8250 Quadriplegia, unspecified: Secondary | ICD-10-CM | POA: Diagnosis not present

## 2018-07-16 DIAGNOSIS — M24541 Contracture, right hand: Secondary | ICD-10-CM | POA: Diagnosis not present

## 2018-07-16 DIAGNOSIS — M6281 Muscle weakness (generalized): Secondary | ICD-10-CM | POA: Diagnosis not present

## 2018-07-17 DIAGNOSIS — M24541 Contracture, right hand: Secondary | ICD-10-CM | POA: Diagnosis not present

## 2018-07-17 DIAGNOSIS — I82402 Acute embolism and thrombosis of unspecified deep veins of left lower extremity: Secondary | ICD-10-CM | POA: Diagnosis not present

## 2018-07-17 DIAGNOSIS — G8250 Quadriplegia, unspecified: Secondary | ICD-10-CM | POA: Diagnosis not present

## 2018-07-17 DIAGNOSIS — M6281 Muscle weakness (generalized): Secondary | ICD-10-CM | POA: Diagnosis not present

## 2018-07-20 ENCOUNTER — Emergency Department (HOSPITAL_COMMUNITY): Payer: Medicare Other

## 2018-07-20 ENCOUNTER — Other Ambulatory Visit: Payer: Self-pay

## 2018-07-20 ENCOUNTER — Telehealth (INDEPENDENT_AMBULATORY_CARE_PROVIDER_SITE_OTHER): Payer: Self-pay | Admitting: Family

## 2018-07-20 ENCOUNTER — Ambulatory Visit (INDEPENDENT_AMBULATORY_CARE_PROVIDER_SITE_OTHER): Payer: Self-pay | Admitting: Pediatrics

## 2018-07-20 ENCOUNTER — Encounter (HOSPITAL_COMMUNITY): Payer: Self-pay

## 2018-07-20 ENCOUNTER — Emergency Department (HOSPITAL_COMMUNITY)
Admission: EM | Admit: 2018-07-20 | Discharge: 2018-07-20 | Disposition: A | Discharge status: Home / Self Care | Payer: Medicare Other | Attending: Emergency Medicine | Admitting: Emergency Medicine

## 2018-07-20 DIAGNOSIS — E162 Hypoglycemia, unspecified: Secondary | ICD-10-CM | POA: Diagnosis not present

## 2018-07-20 DIAGNOSIS — R195 Other fecal abnormalities: Secondary | ICD-10-CM | POA: Diagnosis not present

## 2018-07-20 DIAGNOSIS — Z7901 Long term (current) use of anticoagulants: Secondary | ICD-10-CM | POA: Diagnosis not present

## 2018-07-20 DIAGNOSIS — R112 Nausea with vomiting, unspecified: Secondary | ICD-10-CM | POA: Diagnosis not present

## 2018-07-20 DIAGNOSIS — M2550 Pain in unspecified joint: Secondary | ICD-10-CM | POA: Diagnosis not present

## 2018-07-20 DIAGNOSIS — G8250 Quadriplegia, unspecified: Secondary | ICD-10-CM | POA: Diagnosis not present

## 2018-07-20 DIAGNOSIS — M6281 Muscle weakness (generalized): Secondary | ICD-10-CM | POA: Diagnosis not present

## 2018-07-20 DIAGNOSIS — K5900 Constipation, unspecified: Secondary | ICD-10-CM

## 2018-07-20 DIAGNOSIS — M24541 Contracture, right hand: Secondary | ICD-10-CM | POA: Diagnosis not present

## 2018-07-20 DIAGNOSIS — R109 Unspecified abdominal pain: Secondary | ICD-10-CM | POA: Diagnosis not present

## 2018-07-20 DIAGNOSIS — I484 Atypical atrial flutter: Secondary | ICD-10-CM | POA: Diagnosis not present

## 2018-07-20 DIAGNOSIS — R4189 Other symptoms and signs involving cognitive functions and awareness: Secondary | ICD-10-CM | POA: Diagnosis not present

## 2018-07-20 DIAGNOSIS — Z7401 Bed confinement status: Secondary | ICD-10-CM | POA: Diagnosis not present

## 2018-07-20 DIAGNOSIS — Z79899 Other long term (current) drug therapy: Secondary | ICD-10-CM | POA: Insufficient documentation

## 2018-07-20 DIAGNOSIS — E161 Other hypoglycemia: Secondary | ICD-10-CM | POA: Diagnosis not present

## 2018-07-20 DIAGNOSIS — N390 Urinary tract infection, site not specified: Secondary | ICD-10-CM | POA: Insufficient documentation

## 2018-07-20 DIAGNOSIS — R531 Weakness: Secondary | ICD-10-CM | POA: Diagnosis not present

## 2018-07-20 DIAGNOSIS — R400 Somnolence: Secondary | ICD-10-CM | POA: Diagnosis not present

## 2018-07-20 DIAGNOSIS — R5383 Other fatigue: Secondary | ICD-10-CM | POA: Diagnosis not present

## 2018-07-20 DIAGNOSIS — R05 Cough: Secondary | ICD-10-CM | POA: Diagnosis not present

## 2018-07-20 DIAGNOSIS — R4781 Slurred speech: Secondary | ICD-10-CM | POA: Diagnosis not present

## 2018-07-20 LAB — PROTIME-INR
INR: 2.82
PROTHROMBIN TIME: 29.4 s — AB (ref 11.4–15.2)

## 2018-07-20 LAB — URINALYSIS, ROUTINE W REFLEX MICROSCOPIC
BILIRUBIN URINE: NEGATIVE
Glucose, UA: NEGATIVE mg/dL
Ketones, ur: NEGATIVE mg/dL
Nitrite: POSITIVE — AB
PH: 8 (ref 5.0–8.0)
Protein, ur: NEGATIVE mg/dL
SPECIFIC GRAVITY, URINE: 1.009 (ref 1.005–1.030)

## 2018-07-20 LAB — COMPREHENSIVE METABOLIC PANEL
ALBUMIN: 3.8 g/dL (ref 3.5–5.0)
ALK PHOS: 84 U/L (ref 38–126)
ALT: 25 U/L (ref 0–44)
AST: 22 U/L (ref 15–41)
Anion gap: 10 (ref 5–15)
BILIRUBIN TOTAL: 0.3 mg/dL (ref 0.3–1.2)
BUN: 14 mg/dL (ref 6–20)
CALCIUM: 8.8 mg/dL — AB (ref 8.9–10.3)
CO2: 24 mmol/L (ref 22–32)
Chloride: 104 mmol/L (ref 98–111)
Creatinine, Ser: 0.57 mg/dL (ref 0.44–1.00)
GFR calc Af Amer: >60 mL/min (ref >60)
GFR calc non Af Amer: >60 mL/min (ref >60)
GLUCOSE: 84 mg/dL (ref 70–99)
Potassium: 4.2 mmol/L (ref 3.5–5.1)
Sodium: 138 mmol/L (ref 135–145)
TOTAL PROTEIN: 7.3 g/dL (ref 6.5–8.1)

## 2018-07-20 LAB — I-STAT TROPONIN, ED: TROPONIN I, POC: 0 ng/mL (ref 0.00–0.08)

## 2018-07-20 LAB — CBC WITH DIFFERENTIAL/PLATELET
BASOS ABS: 0 10*3/uL (ref 0.0–0.1)
BASOS PCT: 0 %
EOS PCT: 16 %
Eosinophils Absolute: 1.1 10*3/uL — ABNORMAL HIGH (ref 0.0–0.7)
HCT: 41.8 % (ref 36.0–46.0)
Hemoglobin: 13.6 g/dL (ref 12.0–15.0)
LYMPHS PCT: 33 %
Lymphs Abs: 2.3 10*3/uL (ref 0.7–4.0)
MCH: 30 pg (ref 26.0–34.0)
MCHC: 32.5 g/dL (ref 30.0–36.0)
MCV: 92.3 fL (ref 78.0–100.0)
MONO ABS: 0.5 10*3/uL (ref 0.1–1.0)
MONOS PCT: 7 %
Neutro Abs: 3 10*3/uL (ref 1.7–7.7)
Neutrophils Relative %: 44 %
PLATELETS: 246 10*3/uL (ref 150–400)
RBC: 4.53 MIL/uL (ref 3.87–5.11)
RDW: 14.5 % (ref 11.5–15.5)
WBC: 6.9 10*3/uL (ref 4.0–10.5)

## 2018-07-20 LAB — I-STAT CG4 LACTIC ACID, ED: LACTIC ACID, VENOUS: 0.89 mmol/L (ref 0.5–1.9)

## 2018-07-20 MED ORDER — SODIUM CHLORIDE 0.9 % IV SOLN
1.0000 g | Freq: Once | INTRAVENOUS | Status: AC
  Administered 2018-07-20: 1 g via INTRAVENOUS
  Filled 2018-07-20: qty 10

## 2018-07-20 MED ORDER — CEPHALEXIN 500 MG PO CAPS: 500.0000 mg | ORAL_CAPSULE | Freq: Three times a day (TID) | ORAL | 0 refills | Status: DC

## 2018-07-20 MED ORDER — SODIUM CHLORIDE 0.9 % IV BOLUS
500.0000 mL | Freq: Once | INTRAVENOUS | Status: AC
  Administered 2018-07-20: 500 mL via INTRAVENOUS

## 2018-07-20 MED ORDER — IOPAMIDOL (ISOVUE-300) INJECTION 61%
100.0000 mL | Freq: Once | INTRAVENOUS | Status: AC | PRN
  Administered 2018-07-20: 100 mL via INTRAVENOUS

## 2018-07-20 MED ORDER — IOPAMIDOL (ISOVUE-300) INJECTION 61%
INTRAVENOUS | Status: AC
  Filled 2018-07-20: qty 100

## 2018-07-20 MED ORDER — MINERAL OIL RE ENEM
1.0000 | ENEMA | Freq: Once | RECTAL | Status: AC
  Administered 2018-07-20: 1 via RECTAL
  Filled 2018-07-20: qty 1

## 2018-07-20 NOTE — ED Triage Notes (Addendum)
Per facility, pt is more lethargic and "zoned out". Pt recently given pain medication prior to arrival.  Hx of cerebral palsy, dysphagia.

## 2018-07-20 NOTE — ED Notes (Signed)
Pt requests to only be stuck by IV team. Order placed

## 2018-07-20 NOTE — ED Notes (Signed)
Pt in CT.

## 2018-07-20 NOTE — ED Notes (Signed)
IV team at bedside 

## 2018-07-20 NOTE — ED Notes (Signed)
Unable to sign discharge instructions but PTAR is here to transport patient back to facility.

## 2018-07-20 NOTE — Telephone Encounter (Signed)
Noted, thank you

## 2018-07-20 NOTE — ED Notes (Signed)
Assisted patient with a bedpan, which she did not have a bowel movement. Dressed patient for discharge and repositioned patient.

## 2018-07-20 NOTE — ED Provider Notes (Signed)
[facility]-EMERGENCY DEPT Provider Note   CSN: [id number] Arrival date & time: 07/20/18  1546     History   Chief Complaint Chief Complaint  Patient presents with  . Fatigue    HPI [name] [name] is a 42 y.o. female.  HPI  42 year old female with a history of cerebral palsy presents with fatigue.  EMS was called out for possible strokelike symptoms and the report they got was the patient was more lethargic than normal.  The patient tells me that she has been feeling weak and fatigued for months.  Seems to be worsening over the last month.  She feels very tired.  She denies a fever or current headache.  Chronic constipation.  Has had abdominal pain for a few days.  Some cough.  No vomiting or fever.  The patient is very difficult to understand which limits the history given her cerebral palsy.  Past Medical History:  Diagnosis Date  . Abdominal pain 01/02/2012   Overview:  Overview:  Per Previous pcp note- Dr. Ta--Pt has history of endometriosis and had DUB in 2011.  She She had u/s that showed normal endometrial stripe.  She had endometrial bx that was wnl.  She has a lot of abd cramping every month.  This cramping was sometimes not relieved with hydrocodone.  She was referred to [facility] for IUD placement to help endometriosis and abd cramping.  [name]  . Abdominal pain 01/02/2012   Overview:  Overview:  Per Previous pcp note- Dr. Ta--Pt has history of endometriosis and had DUB in 2011.  She She had u/s that showed normal endometrial stripe.  She had endometrial bx that was wnl.  She has a lot of abd cramping every month.  This cramping was sometimes not relieved with hydrocodone.  [facility] for IUD placement to help endometriosis and abd cramping.  [name] was placed 04/30/11 by [facility].  Pt still complains of abd cramping, which I am treating with hydrocodone and ultram. Pt reports some initial relief of pain from IUD, but states it has now  returned back to similar to previous cramping. Gave patient a shot of Toradol 60 mg IM today-since positive exacerbation of abdominal cramping during past 2 weeks. Physical exam reassuring. Patient may need to [facility] workup since she is plugged in with the GYN providers there for further treatment options regarding endometriosis and abdominal cramping. The dysfunctional uterine bleeding now well contr  . Acute cystitis with hematuria   . Acute cystitis without hematuria   . Acute respiratory failure with hypoxia ([name])   . Anemia   . Cellulitis 12/22/2014  . Cerebral palsy ([name])    Spastic Cerebral palsy, mentally intact  . Contracture, joint, multiple sites    Electric wheelchair, uses left hand to operate chair.   . Depression   . Disease of female genital organs 10/24/2010   Overview:  Overview:  Pt has history of endometriosis and had DUB in 2011.  She had u/s that showed normal endometrial stripe.  She had endometrial bx that was wnl.  She has a lot of abd cramping every month.  This cramping was sometimes not relieved with hydrocodone.  She was [facility] for IUD placement to help endometriosis and abd cramping.  [name] was [facility]/11 [facility].  Pt still complains of abd cramping, which was treated with hydrocodone and ultram.   [facility] 12/24/11, given Depo  Lupron injection.  Ordering CT scan to r/o additional cause of abdominal pain (pt with known endometriosis and will not tolerate vaginal U/S, pain not improved s/p [name] or with Lupron injection-- only caused hot flashes and amenorrhea, no pain relief).  Also made referral to pain clinic for better pain control.  Do not think hysterectomy would help with pain, and concern for contamination of baclofen pump if did proceed with surgery.    8/5/13[name]: Pt was seen by   . [name]   . Dysarthria   . Dyspnea and respiratory abnormality 02/04/2011     Overview:  Overview:  Pt has history of recurrent PE and is on chronic coumadin.  Pt has been complaining of dyspnea that she describes as chest tightness.  She has been to the ER multiple times for this.  Usually she gets CTA and serial CE.  She has had at least 6 CTA in a period of since 2011.  She also has an O2 requirement. It is unclear why pt feels dyspneic, complains of chest tightness, or has O2 requirement.  I thought that her complaint may be from deconditioning and referred her to PT.  Unfortunately Medicaid only pays for 4 PT visits.  Pt should be doing these exercises at home, but she has not.  I have referred her to [facility], and she has been seen by [name].  He did not understand the etiology of her dyspnea and O2 requirement.  He will continue to follow her.   Last Assessment & Plan:  Pt presents with concerns for chest tightness and dyspnea.  She was seen in ED yesterday and discharged home.  While there she had a CT chest 04/20/11 showed 1.  Multifocal degradation as detailed above.  N  . Endometriosis   . GERD (gastroesophageal reflux disease)   . Gram positive sepsis ([name]) 10/24/2014  . Gross hematuria 03/12/2013   Overview:  Overview:  status post replacement of suprapubic tube 03/04/2013  Last Assessment & Plan:  Recent urine culture collected in ED on 5/4 did not result in significant growth. Moreover, patient has been asymptomatic with normal WBC, no fever and no true symptoms making symptomatic UTI less likely.  Will continue to monitor for any sign of infection.  Patient's xarelto had been held during her recent hospitalization due to hematuria. This has since been re-started. Monitor bleeding.    [name] Kitchen HCAP (healthcare-associated pneumonia) 10/25/2014  . HCAP (healthcare-associated pneumonia) 11/24/2017  . History of anticoagulant therapy 10/21/2012  . History of endometriosis 10/2012   OBGYN [facility]: Laparoscopic Endometrial Ablation, TVH,  . History of recurrent UTIs   .  Hypertension   . HYPERTENSION, BENIGN 01/31/2010   Qualifier: Diagnosis of  By: [name], MD, [name] Incontinent of feces 03/11/2016  . Infantile cerebral palsy ([name]) 01/29/2007   Overview:  She has spastic CP.  She is mentally intact.  She is wheelchair bound.  She is wheelchair bound for ambulation      . Intracervical pessary 05/07/2011   Overview:  Overview:  Placed by [facility] 04/30/11 to treat DUB and Endometriosis.  She is seen at GYN clinic at [facility] but was considered a poor surgical candidate and referred her to [facility].  For DUB she had pelvic ultrasound that showed thin stripe and she had endometrial Bx by Dr Jennette Kettle in GYN clinic, which was negative.     . Macroscopic hematuria 03/10/2013   status post replacement  of suprapubic tube 03/04/2013   . Migraine   . OCD (obsessive compulsive disorder)   . Parapneumonic effusion 10/25/2014  . Presence of intrathecal baclofen pump 03/07/2014   R LQ. Insertion T10.    [name] Kitchen Psychiatric illness 10/31/2014  . Pulmonary embolism ([name])    Lifetime Coumadin  . Pulmonary embolism ([name]) 2011, 01/2011   Will be on lifetime coumadin  . Recurrent pulmonary embolism ([name]) 05/03/2011   Pt has history of recurrent PE and is on chronic coumadin.  Pt has been complaining of dyspnea that she describes as chest tightness.  She has been to the ER multiple times for this.  Usually she gets CTA and serial CE.  She has had at least 6 CTA in a period of since 2011.  She also has an O2 requirement. It is unclear why pt feels dyspneic, complains of chest tightness, or has O2 requirement.  I thought that her complaint may be from deconditioning and referred her to PT.  Unfortunately Medicaid only pays for 4 PT visits.  Pt should be doing these exercises at home, but she has not.  I have referred her to [facility], and she has been seen by [name].  He did not understand the etiology of her dyspnea and O2 requirement.  He will continue to follow her.     . Seborrheic  keratosis 01/18/2011  . Seizures ([name]) 02/12/2016  . Spasticity 01/05/2014  . Transient alteration of awareness, recurrent 09/08/2006   Recurrent episodes where Glennda  loses contact with her world and that have been studied thoroughly and appear nonepileptic in nature per Dr Terrace Arabia (neurology) The patient has had episodes starting in 2007 that initially began 1-4 times a day during which time she would have periods of unawareness followed by confusion. This continuous video EEG monitoring performed from October 8-12, 2007. Had   . Urethra dilated and patulous 07/03/2015   Patulous, Dilated Urethra. 5mL balloon on a 22-Fr catheter hoping this would prevent the bladder spasm from pushing the balloon down the urethra (Dr Logan Bores, Urology [facility], July 2016)     Patient Active Problem List   Diagnosis Date Noted  . Migraine without aura and without status migrainosus, not intractable 06/26/2018  . UTI (urinary tract infection) 05/25/2018  . Headache 05/25/2018  . Incontinent of feces 03/11/2016  . Chronic anticoagulation 03/11/2016  . Involuntary movements 02/22/2016  . Seizures ([name]) 02/12/2016  . Dyspnea 02/12/2016  . Cerebral palsy, quadriplegic ([name]) 02/06/2016  . Swelling of extremity 01/13/2016  . Fever   . Pressure ulcer 10/09/2015  . Status post insertion of inferior vena caval filter 09/28/2015  . Absence of bladder continence 08/25/2015  . Person living in residential institution 06/14/2015  . Pulmonary hypertension ([name])   . Anemia of chronic disease   . Spasticity 03/15/2015  . Constipation, chronic 10/10/2014  . Athetoid cerebral palsy ([name]) 04/13/2014  . Dystonia 04/12/2014  . UTI (lower urinary tract infection) 03/16/2014  . Presence of intrathecal baclofen pump 03/07/2014  . Dental caries 07/18/2013  . Multiple thyroid nodules 05/20/2013  . Osteoarthrosis 04/07/2013  . [name]   . Dysarthria   . GERD (gastroesophageal reflux disease)   . Neurogenic  bladder 02/05/2013  . Anxiety state 10/21/2012  . Chronic pain syndrome 06/17/2012  . Recurrent pulmonary embolism ([name]) 05/03/2011  . Pulmonary embolism with infarction ([name]) 05/03/2011  . Chronic suprapubic catheter ([name]) 03/15/2011  . Disease of female genital organs 10/24/2010  . HYPERTENSION, BENIGN 01/31/2010  .  CP (cerebral palsy), spastic ([name]) 12/27/2008  . Contracted, joint, multiple sites 12/27/2008  . Major depressive disorder, recurrent episode ([name]) 01/29/2007  . Obsessive Compulsive Disorder 01/29/2007  . Transient alteration of awareness, recurrent 09/08/2006    Past Surgical History:  Procedure Laterality Date  . ABDOMINAL HYSTERECTOMY    . APPENDECTOMY    . BACLOFEN PUMP REFILL     x 3 times  . CARPAL TUNNEL RELEASE  08/2008   Dr Teressa Senter  . CESAREAN SECTION     x 2  . CHOLECYSTECTOMY  10/14/2015   Procedure: LAPAROSCOPIC CHOLECYSTECTOMY;  Surgeon: Violeta Gelinas, MD;  Location: [facility] OR;  Service: General;;  . COLPOSCOPY  06/2000  . INTRAUTERINE DEVICE INSERTION  04/30/11   Inserted by [facility] for endometriosis  . LAPAROSCOPIC ASSISTED VAGINAL HYSTERECTOMY  10/27/2012  . MULTIPLE EXTRACTIONS WITH ALVEOLOPLASTY Bilateral 07/19/2013   Procedure: EXTRACTIONS #4, 1,61,09,60;  Surgeon: Francene Finders, DDS;  Location: [facility] OR;  Service: Oral Surgery;  Laterality: Bilateral;  . NEUROMA SURGERY Left    anterior and posterior intersosseous  . PAIN PUMP IMPLANTATION N/A 03/07/2014   Procedure: baclofen pump revision/replacement and Catheter connection replacement;  Surgeon: Cristi Loron, MD;  Location: [facility]UR[facility] ORS;  Service: Neurosurgery;  Laterality: N/A;  baclofen pump revision/replacement and Catheter connection replacement  . PROGRAMABLE BACLOFEN PUMP REVISION  03/17/14   Battery Replacement   . TUBAL LIGATION  2003  . URETHRA SURGERY    . WRIST SURGERY  06/2010   Dr Dierdre Searles, hand surgeon, [name]     OB History   None      Home Medications    Prior  to Admission medications   Medication Sig Start Date End Date Taking? Authorizing Provider  acetaminophen (TYLENOL 8 HOUR) 650 MG CR tablet Take 1 tablet (650 mg total) by mouth every 8 (eight) hours as needed. 07/08/18   Derwood Kaplan, MD  amantadine (SYMMETREL) 100 MG capsule Take 100 mg by mouth daily.     [provider]  baclofen (LIORESAL) 20 MG tablet Take 20 mg by mouth 4 (four) times daily. For bladder spasms    [provider]  bisacodyl (DULCOLAX) 10 MG suppository Place 10 mg rectally as needed (For constipation.).    [provider]  Buprenorphine HCl (BELBUCA) 450 MCG FILM Place 450 mg inside cheek every 12 (twelve) hours.    [provider]  busPIRone (BUSPAR) 5 MG tablet Take 5 mg by mouth 3 (three) times daily.    [provider]  butalbital-acetaminophen-caffeine (FIORICET, ESGIC) 50-325-40 MG tablet Take 2 tablets by mouth every 4 (four) hours as needed for headache (not controlled by Tylenol). 05/29/18   Darlin Drop, DO  clonazePAM (KLONOPIN) 0.5 MG tablet Take 1.5 tablets (0.75 mg total) by mouth 3 (three) times daily. 11/27/17   Narda Bonds, MD  feeding supplement, ENSURE ENLIVE, (ENSURE ENLIVE) LIQD Take 237 mLs by mouth 2 (two) times daily between meals. 05/30/18   Darlin Drop, DO  ferrous sulfate 325 (65 FE) MG tablet Take 325 mg by mouth daily.     [provider]  FLUoxetine (PROZAC) 40 MG capsule Take 80 mg by mouth daily.    [provider]  lactulose, encephalopathy, (ENULOSE) 10 GM/15ML SOLN Take 20 g by mouth daily as needed (For constipation.).    [provider]  lamoTRIgine (LAMICTAL) 100 MG tablet Take 100 mg by mouth at bedtime.    [provider]  linaclotide Karlene Einstein)  145 MCG CAPS capsule Take 145 mcg by mouth daily before breakfast.    [provider]  lubiprostone (AMITIZA) 24 MCG capsule Take 1 capsule (24 mcg total) by mouth 2 (two) times daily with a  meal. Patient not taking: Reported on 07/07/2018 01/01/17   Esterwood, [name] S, PA-C  Melatonin 5 MG TABS Take 5 mg by mouth at bedtime.     [provider]  Multiple Vitamins-Minerals (CERTA-VITE PO) Take 1 tablet by mouth daily.    [provider]  OVER THE COUNTER MEDICATION Take 1 Package by mouth daily. Magic cup     [provider]  oxybutynin (DITROPAN) 5 MG tablet Take 5 mg by mouth 4 (four) times daily.  02/20/16   [provider]  oxyCODONE (ROXICODONE) 5 MG immediate release tablet Take 5 mg by mouth every 4 (four) hours as needed for moderate pain or severe pain.    [provider]  polyethylene glycol powder (GLYCOLAX/MIRALAX) powder Take 1  1/2 dose  daily in 12 ounces of fluid every day. Patient taking differently: Take 17 g by mouth daily. Mix in 8 ounces of liquid. 01/01/17   Esterwood, [name] S, PA-C  QUEtiapine (SEROQUEL) 50 MG tablet Take 150 mg by mouth at bedtime.    [provider]  tiZANidine (ZANAFLEX) 4 MG capsule Take 4 mg by mouth 2 (two) times daily.     [provider]  warfarin (COUMADIN) 1 MG tablet Take 0.5 mg by mouth daily.    [provider]  warfarin (COUMADIN) 4 MG tablet Take 4 mg by mouth daily.    [provider]  warfarin (COUMADIN) 5 MG tablet Take 5 mg by mouth daily.    [provider]  warfarin (COUMADIN) 6 MG tablet Take 1 tablet (6 mg total) by mouth daily. Patient not taking: Reported on 07/07/2018 06/12/18   Molpus, Jonny Ruiz, MD    Family History Family History  Problem Relation Age of Onset  . Asthma Father   . Colon cancer Maternal Grandmother        Died in her 41's  . Breast cancer Paternal Grandmother        Died in her 7's  . Heart attack Maternal Grandfather        Died in his 56's  . Alzheimer's disease Paternal Grandfather        Died in his 24's  . Breast cancer Mother   . Stomach cancer Neg Hx     Social History Social History   Tobacco Use  .  Smoking status: Never Smoker  . Smokeless tobacco: Never Used  Substance Use Topics  . Alcohol use: Yes    Alcohol/week: 1.0 standard drinks    Types: 1 Glasses of wine per week    Comment: Drinks alcohol once a month.  . Drug use: No     Allergies   Morphine   Review of Systems Review of Systems  Constitutional: Positive for fatigue. Negative for fever.  Respiratory: Positive for cough. Negative for shortness of breath.   Cardiovascular: Negative for chest pain.  Gastrointestinal: Positive for constipation. Negative for vomiting.  Neurological: Positive for weakness. Negative for headaches.  All other systems reviewed and are negative.    Physical Exam Updated Vital Signs BP 100/87   Pulse 67   Temp (!) 96.9 F (36.1 C) (Rectal)   Resp 14   Ht 4\' 10"  (1.473 m)   Wt 49.4 kg   LMP 04/05/2011   SpO2  93%   BMI 22.78 kg/m   Physical Exam  Constitutional: She is oriented to person, place, and time. She appears well-developed. She appears cachectic. No distress.  HENT:  Head: Normocephalic and atraumatic.  Right Ear: External ear normal.  Left Ear: External ear normal.  Nose: Nose normal.  Mouth/Throat: Mucous membranes are dry.  Eyes: Right eye exhibits no discharge. Left eye exhibits no discharge.  Cardiovascular: Normal rate, regular rhythm and normal heart sounds.  Pulmonary/Chest: Effort normal and breath sounds normal.  Abdominal: Soft. She exhibits no distension. There is tenderness (mild) in the suprapubic area.  Genitourinary:  Genitourinary Comments: No gross blood on rectal exam. Fair amount of soft stool a few cm from rectum but no impaction  Neurological: She is alert and oriented to person, place, and time.  Contracted extremities. Able to move all 4 extremities weakly.  Skin: Skin is warm and dry. She is not diaphoretic.  Nursing note and vitals reviewed.    ED Treatments / Results  Labs (all labs ordered are listed, but only abnormal results  are displayed) Labs Reviewed  URINALYSIS, ROUTINE W REFLEX MICROSCOPIC - Abnormal; Notable for the following components:      Result Value   APPearance CLOUDY (*)    Hgb urine dipstick SMALL (*)    Nitrite POSITIVE (*)    Leukocytes, UA LARGE (*)    Bacteria, UA RARE (*)    All other components within normal limits  URINE CULTURE  COMPREHENSIVE METABOLIC PANEL  CBC WITH DIFFERENTIAL/PLATELET  PROTIME-INR  I-STAT TROPONIN, ED  I-STAT CG4 LACTIC ACID, ED  I-STAT CG4 LACTIC ACID, ED    EKG EKG Interpretation  Date/Time:  Monday July 20 2018 16:10:30 EDT Ventricular Rate:  85 PR Interval:    QRS Duration: 96 QT Interval:  386 QTC Calculation: 459 R Axis:   32 Text Interpretation:  Sinus rhythm no acute ST/T changes no significant change since June 2019 Confirmed by Pricilla LovelessGoldston, Waneta Fitting [phone]([id number]) on 07/20/2018 4:38:30 PM Also confirmed by Pricilla LovelessGoldston, Amarrion Pastorino [phone]), editor Barbette Hairassel, Kerry [phone])  on 07/20/2018 4:55:19 PM   Radiology Dg Chest Portable 1 View  Result Date: 07/20/2018 CLINICAL DATA:  Cough. EXAM: PORTABLE CHEST 1 VIEW COMPARISON:  05/25/2018 and 11/24/2017 FINDINGS: Heart size and pulmonary vascularity are normal. The lungs are clear except for minimal atelectasis at the right lung base laterally which is probably due to a shallow inspiration. No effusions or lung consolidation. No acute bone abnormality. Extensive lower cervical and upper thoracic fusion. IMPRESSION: No significant abnormalities. Electronically Signed   By: Francene BoyersJames  Maxwell M.D.   On: 07/20/2018 16:49    Procedures Procedures (including critical care time)  Medications Ordered in ED Medications  sodium chloride 0.9 % bolus 500 mL (has no administration in time range)  cefTRIAXone (ROCEPHIN) 1 g in sodium chloride 0.9 % 100 mL IVPB (has no administration in time range)  mineral oil enema 1 enema (1 enema Rectal Given 07/20/18 1750)     Initial Impression / Assessment and Plan / ED Course  I have reviewed  the triage vital signs and the nursing notes.  Pertinent labs & imaging results that were available during my care of the patient were reviewed by me and considered in my medical decision making (see chart for details).     Patient has mildly low temperature here but otherwise vital signs are unremarkable.  She will have work-up for her generalized fatigue that is worsening including infectious work-up.  Difficult IV access to IV  team consulted who has placed an IV and currently labs are pending.  She will be given a dose of Rocephin for the UA results although is unclear if this is colonized urine versus UTI.  She has had a small to moderate amount of stool come out per the nurse from her enema but she still having some abdominal discomfort so a CT will be obtained.  Could all be constipation but I think she will need CT to help rule out other abdominal emergencies.  Care transferred to Dr. Freida Busman.  Final Clinical Impressions(s) / ED Diagnoses   Final diagnoses:  None    ED Discharge Orders    None       Pricilla Loveless, MD 07/20/18 [phone]

## 2018-07-20 NOTE — ED Notes (Signed)
Informed [facility] of transportation to [facility].

## 2018-07-20 NOTE — ED Notes (Signed)
Bed: WA11 Expected date:  Expected time:  Means of arrival:  Comments: EMS 

## 2018-07-20 NOTE — ED Provider Notes (Signed)
Patient signed out to me by Dr. Gwenlyn FudgeGoldstein and an abdominal CT consistent with constipation.  Had an enema here.  She is in no distress.  Was given Rocephin for UTI and culture was sent and will place on Keflex and discharged home   Lorre NickAllen, Tomas Schamp, MD 07/20/18 2148

## 2018-07-20 NOTE — ED Notes (Signed)
Family has came to bedside and Dr. Cordelia PocheA. Allen has updated patient and family member on patients condition.

## 2018-07-20 NOTE — Telephone Encounter (Signed)
I received a call from [name] [facility]Toya Williamson [facility] at [facility]. She said that [name] [name] had been doing well after starting Topiramate for migraine prevention on 07/10/18 until today when she was noted to have slurred speech, slow to respond, "not herself". She had reduced the dose of Fioricet until today when she was given a dose this morning. [name] [name] was sent to ER because of her symptoms today. I told [name] [name] that it was not likely the Topiramate as it was started at low dose. I will review her ER chart later for their findings. TG

## 2018-07-21 DIAGNOSIS — M6281 Muscle weakness (generalized): Secondary | ICD-10-CM | POA: Diagnosis not present

## 2018-07-21 DIAGNOSIS — M24541 Contracture, right hand: Secondary | ICD-10-CM | POA: Diagnosis not present

## 2018-07-21 DIAGNOSIS — G8250 Quadriplegia, unspecified: Secondary | ICD-10-CM | POA: Diagnosis not present

## 2018-07-22 DIAGNOSIS — I484 Atypical atrial flutter: Secondary | ICD-10-CM | POA: Diagnosis not present

## 2018-07-22 DIAGNOSIS — M24541 Contracture, right hand: Secondary | ICD-10-CM | POA: Diagnosis not present

## 2018-07-22 DIAGNOSIS — M6281 Muscle weakness (generalized): Secondary | ICD-10-CM | POA: Diagnosis not present

## 2018-07-22 DIAGNOSIS — G8250 Quadriplegia, unspecified: Secondary | ICD-10-CM | POA: Diagnosis not present

## 2018-07-22 LAB — URINE CULTURE

## 2018-07-23 DIAGNOSIS — M24541 Contracture, right hand: Secondary | ICD-10-CM | POA: Diagnosis not present

## 2018-07-23 DIAGNOSIS — F4322 Adjustment disorder with anxiety: Secondary | ICD-10-CM | POA: Diagnosis not present

## 2018-07-23 DIAGNOSIS — M6281 Muscle weakness (generalized): Secondary | ICD-10-CM | POA: Diagnosis not present

## 2018-07-23 DIAGNOSIS — G8250 Quadriplegia, unspecified: Secondary | ICD-10-CM | POA: Diagnosis not present

## 2018-07-24 DIAGNOSIS — M6281 Muscle weakness (generalized): Secondary | ICD-10-CM | POA: Diagnosis not present

## 2018-07-24 DIAGNOSIS — I2699 Other pulmonary embolism without acute cor pulmonale: Secondary | ICD-10-CM | POA: Diagnosis not present

## 2018-07-24 DIAGNOSIS — G43009 Migraine without aura, not intractable, without status migrainosus: Secondary | ICD-10-CM | POA: Diagnosis not present

## 2018-07-24 DIAGNOSIS — M24541 Contracture, right hand: Secondary | ICD-10-CM | POA: Diagnosis not present

## 2018-07-24 DIAGNOSIS — G8250 Quadriplegia, unspecified: Secondary | ICD-10-CM | POA: Diagnosis not present

## 2018-07-27 DIAGNOSIS — G8250 Quadriplegia, unspecified: Secondary | ICD-10-CM | POA: Diagnosis not present

## 2018-07-27 DIAGNOSIS — M6281 Muscle weakness (generalized): Secondary | ICD-10-CM | POA: Diagnosis not present

## 2018-07-27 DIAGNOSIS — M24541 Contracture, right hand: Secondary | ICD-10-CM | POA: Diagnosis not present

## 2018-07-27 DIAGNOSIS — I484 Atypical atrial flutter: Secondary | ICD-10-CM | POA: Diagnosis not present

## 2018-07-28 DIAGNOSIS — M6281 Muscle weakness (generalized): Secondary | ICD-10-CM | POA: Diagnosis not present

## 2018-07-28 DIAGNOSIS — G8250 Quadriplegia, unspecified: Secondary | ICD-10-CM | POA: Diagnosis not present

## 2018-07-28 DIAGNOSIS — M24541 Contracture, right hand: Secondary | ICD-10-CM | POA: Diagnosis not present

## 2018-07-29 DIAGNOSIS — G8250 Quadriplegia, unspecified: Secondary | ICD-10-CM | POA: Diagnosis not present

## 2018-07-29 DIAGNOSIS — G809 Cerebral palsy, unspecified: Secondary | ICD-10-CM | POA: Diagnosis not present

## 2018-07-29 DIAGNOSIS — R768 Other specified abnormal immunological findings in serum: Secondary | ICD-10-CM | POA: Diagnosis not present

## 2018-07-29 DIAGNOSIS — M24541 Contracture, right hand: Secondary | ICD-10-CM | POA: Diagnosis not present

## 2018-07-29 DIAGNOSIS — M6281 Muscle weakness (generalized): Secondary | ICD-10-CM | POA: Diagnosis not present

## 2018-07-29 DIAGNOSIS — M24549 Contracture, unspecified hand: Secondary | ICD-10-CM | POA: Diagnosis not present

## 2018-07-29 DIAGNOSIS — M359 Systemic involvement of connective tissue, unspecified: Secondary | ICD-10-CM | POA: Diagnosis not present

## 2018-07-29 DIAGNOSIS — R5383 Other fatigue: Secondary | ICD-10-CM | POA: Diagnosis not present

## 2018-07-29 DIAGNOSIS — M2550 Pain in unspecified joint: Secondary | ICD-10-CM | POA: Diagnosis not present

## 2018-07-30 DIAGNOSIS — G8250 Quadriplegia, unspecified: Secondary | ICD-10-CM | POA: Diagnosis not present

## 2018-07-30 DIAGNOSIS — M6281 Muscle weakness (generalized): Secondary | ICD-10-CM | POA: Diagnosis not present

## 2018-07-30 DIAGNOSIS — M24541 Contracture, right hand: Secondary | ICD-10-CM | POA: Diagnosis not present

## 2018-07-31 DIAGNOSIS — M24541 Contracture, right hand: Secondary | ICD-10-CM | POA: Diagnosis not present

## 2018-07-31 DIAGNOSIS — G8250 Quadriplegia, unspecified: Secondary | ICD-10-CM | POA: Diagnosis not present

## 2018-07-31 DIAGNOSIS — M6281 Muscle weakness (generalized): Secondary | ICD-10-CM | POA: Diagnosis not present

## 2018-08-01 DIAGNOSIS — M24541 Contracture, right hand: Secondary | ICD-10-CM | POA: Diagnosis not present

## 2018-08-01 DIAGNOSIS — M6281 Muscle weakness (generalized): Secondary | ICD-10-CM | POA: Diagnosis not present

## 2018-08-01 DIAGNOSIS — G8250 Quadriplegia, unspecified: Secondary | ICD-10-CM | POA: Diagnosis not present

## 2018-08-04 DIAGNOSIS — M24541 Contracture, right hand: Secondary | ICD-10-CM | POA: Diagnosis not present

## 2018-08-04 DIAGNOSIS — N390 Urinary tract infection, site not specified: Secondary | ICD-10-CM | POA: Diagnosis not present

## 2018-08-05 DIAGNOSIS — N390 Urinary tract infection, site not specified: Secondary | ICD-10-CM | POA: Diagnosis not present

## 2018-08-05 DIAGNOSIS — M24541 Contracture, right hand: Secondary | ICD-10-CM | POA: Diagnosis not present

## 2018-08-06 DIAGNOSIS — Z79899 Other long term (current) drug therapy: Secondary | ICD-10-CM | POA: Diagnosis not present

## 2018-08-06 DIAGNOSIS — F4322 Adjustment disorder with anxiety: Secondary | ICD-10-CM | POA: Diagnosis not present

## 2018-08-06 DIAGNOSIS — G809 Cerebral palsy, unspecified: Secondary | ICD-10-CM | POA: Diagnosis not present

## 2018-08-06 DIAGNOSIS — M542 Cervicalgia: Secondary | ICD-10-CM | POA: Diagnosis not present

## 2018-08-07 ENCOUNTER — Ambulatory Visit (INDEPENDENT_AMBULATORY_CARE_PROVIDER_SITE_OTHER): Payer: Medicare Other | Admitting: Family

## 2018-08-07 ENCOUNTER — Encounter (INDEPENDENT_AMBULATORY_CARE_PROVIDER_SITE_OTHER): Payer: Self-pay | Admitting: Family

## 2018-08-07 VITALS — BP 120/90 | HR 80

## 2018-08-07 DIAGNOSIS — G43009 Migraine without aura, not intractable, without status migrainosus: Secondary | ICD-10-CM

## 2018-08-07 DIAGNOSIS — G808 Other cerebral palsy: Secondary | ICD-10-CM

## 2018-08-07 DIAGNOSIS — M24541 Contracture, right hand: Secondary | ICD-10-CM | POA: Diagnosis not present

## 2018-08-07 DIAGNOSIS — G249 Dystonia, unspecified: Secondary | ICD-10-CM | POA: Diagnosis not present

## 2018-08-07 DIAGNOSIS — M359 Systemic involvement of connective tissue, unspecified: Secondary | ICD-10-CM | POA: Diagnosis not present

## 2018-08-07 DIAGNOSIS — N390 Urinary tract infection, site not specified: Secondary | ICD-10-CM | POA: Diagnosis not present

## 2018-08-07 MED ORDER — TOPIRAMATE 25 MG PO TABS: ORAL_TABLET | ORAL | 5 refills | Status: DC

## 2018-08-07 NOTE — Progress Notes (Signed)
Patient: [name] [name] MRN: [id number] Sex: female DOB: January 05, 1976  Provider: Elveria Rising, NP Location of Care: [facility]  Note type: Routine return visit  History of Present Illness: Referral Source: [name] March, MD History from: patient and [facility] chart Chief Complaint: Headaches  [name] [name] is a 42 y.o. 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 She has an implanted intrathecal pump to deliver Baclofen and sees [name] [name] for that. She was last seen by me on July 07, 2018. At that time she was experiencing severe headaches for about 4 months. She had been taking Fioricet and Oxycodone daily for headaches and I recommended that she attempt to reduce her use of these medications. I was concerned about starting her on Topiramate because of her history of mood disorder, but after speaking with her primary care provider [name] [name], decided to try it for migraine prophylaxis. [name] [name] tells me today that she has tolerated the Topiramate well and that her headaches have reduced substantially. She says that she is no longer taking Fioricet and that she takes Tylenol and/or Oxycodone about once per day for either headache or body pain. [name] [name] is pleased with her headache improvement thus far.   Since [name] [name]'s last visit, she was seen in the ER once for altered mental status and was diagnosed with UTI and constipation. She tells me today that she has also been seen by [name] [name] with rheumatology and that she has been told that she has a connective tissue disorder. [name] [name] says that she is taking Plaquenil 200mg  for 30 days, then will have a return visit. [name] [name] also reports that she tested positive for rheumatoid arthritis and lupus but that she is not symptomatic.   [name] [name]'s mood is considerably better than at her last visit and she tells me that she is headache free today. She is frustrated with living at [place] [facility] and hopes to  move out to an apartment with a CNA at some point. She has also reconnected with a former boyfriend, [name] [name], and is happy about that. She continues to take Fluoxetine and Buspar for mood as well as sees a counselor monthly and a psychiatrist every 3 months.   [name] [name] has been otherwise healthy since she was last seen. She has no other health concerns today other than previously mentioned.  Review of Systems: Please see the HPI for neurologic and other pertinent review of systems. Otherwise, all other systems were reviewed and were negative.    Past Medical History:  Diagnosis Date  . Abdominal pain 01/02/2012   Overview:  Overview:  Per Previous pcp note- [name]. Ta--Pt has history of endometriosis and had DUB in 2011.  She She had u/s that showed normal endometrial stripe.  She had endometrial bx that was wnl.  She has a lot of abd cramping every month.  This cramping was sometimes not relieved with hydrocodone.  She was referred to [facility] for IUD placement to help endometriosis and abd cramping.  [name]  . Abdominal pain 01/02/2012   Overview:  Overview:  Per Previous pcp note- [name]. Ta--Pt has history of endometriosis and had DUB in 2011.  She She had u/s that showed normal endometrial stripe.  She had endometrial bx that was wnl.  She has a lot of abd cramping every month.  This cramping was sometimes not relieved with hydrocodone.  She was referred to [facility] for IUD placement to help endometriosis and abd cramping.  [name] was placed 04/30/11 by  [facility].  Pt still complains of abd cramping, which I am treating with hydrocodone and ultram. Pt reports some initial relief of pain from IUD, but states it has now returned back to similar to previous cramping. Gave patient a shot of Toradol 60 mg IM today-since positive exacerbation of abdominal cramping during past 2 weeks. Physical exam reassuring. Patient may need to return to [facility] for further workup since she is plugged in with the GYN providers  there for further treatment options regarding endometriosis and abdominal cramping. The dysfunctional uterine bleeding now well contr  . Acute cystitis with hematuria   . Acute cystitis without hematuria   . Acute respiratory failure with hypoxia ([name])   . Anemia   . Cellulitis 12/22/2014  . Cerebral palsy ([name])    Spastic Cerebral palsy, mentally intact  . Contracture, joint, multiple sites    Electric wheelchair, uses left hand to operate chair.   . Depression   . Disease of female genital organs 10/24/2010   Overview:  Overview:  Pt has history of endometriosis and had DUB in 2011.  She had u/s that showed normal endometrial stripe.  She had endometrial bx that was wnl.  She has a lot of abd cramping every month.  This cramping was sometimes not relieved with hydrocodone.  She was referred to [facility] for IUD placement to help endometriosis and abd cramping.  [name] was placed 04/30/11 by [facility].  Pt still complains of abd cramping, which was treated with hydrocodone and ultram.   Last seen at [name] by OB/GYN 12/24/11, given Depo Lupron injection.  Ordering CT scan to r/o additional cause of abdominal pain (pt with known endometriosis and will not tolerate vaginal U/S, pain not improved s/p [name] or with Lupron injection-- only caused hot flashes and amenorrhea, no pain relief).  Also made referral to pain clinic for better pain control.  Do not think hysterectomy would help with pain, and concern for contamination of baclofen pump if did proceed with surgery.    8/5/13[name] OB/GYN: Pt was seen by   . [name]   . Dysarthria   . Dyspnea and respiratory abnormality 02/04/2011   Overview:  Overview:  Pt has history of recurrent PE and is on chronic coumadin.  Pt has been complaining of dyspnea that she describes as chest tightness.  She has been to the ER multiple times for this.  Usually she gets CTA and serial CE.  She has had at least 6 CTA in a period of since 2011.   She also has an O2 requirement. It is unclear why pt feels dyspneic, complains of chest tightness, or has O2 requirement.  I thought that her complaint may be from deconditioning and referred her to PT.  Unfortunately Medicaid only pays for 4 PT visits.  Pt should be doing these exercises at home, but she has not.  I have referred her to [facility], and she has been seen by [name] Marchelle Gearing.  He did not understand the etiology of her dyspnea and O2 requirement.  He will continue to follow her.   Last Assessment & Plan:  Pt presents with concerns for chest tightness and dyspnea.  She was seen in ED yesterday and discharged home.  While there she had a CT chest 04/20/11 showed 1.  Multifocal degradation as detailed above.  N  . Endometriosis   . GERD (gastroesophageal reflux disease)   . Gram positive sepsis ([name]) 10/24/2014  . Gross hematuria 03/12/2013  Overview:  Overview:  status post replacement of suprapubic tube 03/04/2013  Last Assessment & Plan:  Recent urine culture collected in ED on 5/4 did not result in significant growth. Moreover, patient has been asymptomatic with normal WBC, no fever and no true symptoms making symptomatic UTI less likely.  Will continue to monitor for any sign of infection.  Patient's xarelto had been held during her recent hospitalization due to hematuria. This has since been re-started. Monitor bleeding.    [name] Kitchen HCAP (healthcare-associated pneumonia) 10/25/2014  . HCAP (healthcare-associated pneumonia) 11/24/2017  . History of anticoagulant therapy 10/21/2012  . History of endometriosis 10/2012   OBGYN [facility]: Laparoscopic Endometrial Ablation, TVH,  . History of recurrent UTIs   . Hypertension   . HYPERTENSION, BENIGN 01/31/2010   Qualifier: Diagnosis of  By: [name], MD, [name] Incontinent of feces 03/11/2016  . Infantile cerebral palsy ([name]) 01/29/2007   Overview:  She has spastic CP.  She is mentally intact.  She is wheelchair bound.  She is wheelchair bound for  ambulation      . Intracervical pessary 05/07/2011   Overview:  Overview:  Placed by [facility] 04/30/11 to treat DUB and Endometriosis.  She is seen at GYN clinic at [facility] but was considered a poor surgical candidate and referred her to [facility].  For DUB she had pelvic ultrasound that showed thin stripe and she had endometrial Bx by [name] Jennette Kettle in GYN clinic, which was negative.     . Macroscopic hematuria 03/10/2013   status post replacement of suprapubic tube 03/04/2013   . Migraine   . OCD (obsessive compulsive disorder)   . Parapneumonic effusion 10/25/2014  . Presence of intrathecal baclofen pump 03/07/2014   R LQ. Insertion T10.    [name] Kitchen Psychiatric illness 10/31/2014  . Pulmonary embolism ([name])    Lifetime Coumadin  . Pulmonary embolism ([name]) 2011, 01/2011   Will be on lifetime coumadin  . Recurrent pulmonary embolism ([name]) 05/03/2011   Pt has history of recurrent PE and is on chronic coumadin.  Pt has been complaining of dyspnea that she describes as chest tightness.  She has been to the ER multiple times for this.  Usually she gets CTA and serial CE.  She has had at least 6 CTA in a period of since 2011.  She also has an O2 requirement. It is unclear why pt feels dyspneic, complains of chest tightness, or has O2 requirement.  I thought that her complaint may be from deconditioning and referred her to PT.  Unfortunately Medicaid only pays for 4 PT visits.  Pt should be doing these exercises at home, but she has not.  I have referred her to [facility], and she has been seen by [name] Marchelle Gearing.  He did not understand the etiology of her dyspnea and O2 requirement.  He will continue to follow her.     . Seborrheic keratosis 01/18/2011  . Seizures ([name]) 02/12/2016  . Spasticity 01/05/2014  . Transient alteration of awareness, recurrent 09/08/2006   Recurrent episodes where Emiline  loses contact with her world and that have been studied thoroughly and appear nonepileptic in nature per [name] [place] (neurology) The patient  has had episodes starting in 2007 that initially began 1-4 times a day during which time she would have periods of unawareness followed by confusion. This continuous video EEG monitoring performed from October 8-12, 2007. Had   . Urethra dilated and patulous 07/03/2015   Patulous, Dilated Urethra. 5mL  balloon on a 22-Fr catheter hoping this would prevent the bladder spasm from pushing the balloon down the urethra ([name] Logan Bores, Urology [facility], July 2016)    Hospitalizations: No., Head Injury: No., Nervous System Infections: No., Immunizations up to date: Yes.   Past Medical History Comments: See HPI  Birth History Extreme prematurity  Behavior History Anxiety and depression  Surgical History Past Surgical History:  Procedure Laterality Date  . ABDOMINAL HYSTERECTOMY    . APPENDECTOMY    . BACLOFEN PUMP REFILL     x 3 times  . CARPAL TUNNEL RELEASE  08/2008   [name] Teressa Senter  . CESAREAN SECTION     x 2  . CHOLECYSTECTOMY  10/14/2015   Procedure: LAPAROSCOPIC CHOLECYSTECTOMY;  Surgeon: Violeta Gelinas, MD;  Location: [facility] OR;  Service: General;;  . COLPOSCOPY  06/2000  . INTRAUTERINE DEVICE INSERTION  04/30/11   Inserted by [facility]YN for endometriosis  . LAPAROSCOPIC ASSISTED VAGINAL HYSTERECTOMY  10/27/2012  . MULTIPLE EXTRACTIONS WITH ALVEOLOPLASTY Bilateral 07/19/2013   Procedure: EXTRACTIONS #4, 1,61,09,60;  Surgeon: Francene Finders, DDS;  Location: [facility] OR;  Service: Oral Surgery;  Laterality: Bilateral;  . NEUROMA SURGERY Left    anterior and posterior intersosseous  . PAIN PUMP IMPLANTATION N/A 03/07/2014   Procedure: baclofen pump revision/replacement and Catheter connection replacement;  Surgeon: Cristi Loron, MD;  Location: [facility] OR[facility];  Service: Neurosurgery;  Laterality: N/A;  baclofen pump revision/replacement and Catheter connection replacement  . PROGRAMABLE BACLOFEN PUMP REVISION  03/17/14   Battery Replacement   . TUBAL LIGATION  2003  . URETHRA SURGERY    . WRIST  SURGERY  06/2010   [name] Dierdre Searles, hand surgeon, [name]    Family History family history includes Alzheimer's disease in her paternal grandfather; Asthma in her father; Breast cancer in her mother and paternal grandmother; Colon cancer in her maternal grandmother; Heart attack in her maternal grandfather. Family History is otherwise negative for migraines, seizures, cognitive impairment, blindness, deafness, birth defects, chromosomal disorder, autism.  Social History Social History   Socioeconomic History  . Marital status: Divorced    Spouse name: Not on file  . Number of children: 2  . Years of education: college  . Highest education level: Not on file  Occupational History    Employer: UNEMPLOYED  Social Needs  . Financial resource strain: Not on file  . Food insecurity:    Worry: Not on file    Inability: Not on file  . Transportation needs:    Medical: Not on file    Non-medical: Not on file  Tobacco Use  . Smoking status: Never Smoker  . Smokeless tobacco: Never Used  Substance and Sexual Activity  . Alcohol use: Yes    Alcohol/week: 1.0 standard drinks    Types: 1 Glasses of wine per week    Comment: Drinks alcohol once a month.  . Drug use: No  . Sexual activity: Never  Lifestyle  . Physical activity:    Days per week: Not on file    Minutes per session: Not on file  . Stress: Not on file  Relationships  . Social connections:    Talks on phone: Not on file    Gets together: Not on file    Attends religious service: Not on file    Active member of club or organization: Not on file    Attends meetings of clubs or organizations: Not on file    Relationship status: Not on file  Other Topics  Concern  . 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    She enjoys shopping.   Lives in a nursing facility,[facility] since 02/11/16      Ailene Ards, who has  schizophrenia, MR, is father of daughter.  Homero Fellers and pt are no longer together.  Homero Fellers does not see [name]. Daughter is 65 yo, Levy Sjogren, lives in a group home in [place].    [name] [name]'s mother reportedly has breast cancer.    Needs assistance with ADL's and IADL's--has personal care services 20 hrs/wk;       No tobacco, EtOH, drugs.      **Case Manager: Jack Quarto [phone]      Patient lives [facility]    Allergies Allergies  Allergen Reactions  . Morphine Dermatitis and Other (See Comments)    Skin turned red    Physical Exam BP 120/90   Pulse 80   LMP 04/05/2011  General: thin woman, seated in power wheelchair, in no evident distress; graying black hair, brown eyes, left handed Head: normocephalic and atraumatic. Oropharynx benign. No dysmorphic features. She is unable to keep her head midline Neck: supple with no carotid bruits. No focal tenderness. Cardiovascular: regular rate and rhythm, no murmurs. Respiratory: Clear to auscultation bilaterally Abdomen: Bowel sounds present all four quadrants, abdomen soft, non-tender, non-distended. No hepatosplenomegaly or masses palpated. Musculoskeletal: No skeletal deformities. She has neuromuscular scoliosis. Contractures at both wrists. The right hand and wrist is in a splint to keep in neutral position. She is able to extend her left wrist to neutral. Flexion contractures at her hips, markedly increased adductor tone, contractures at the ankles with tight heel cords Skin: no rashes or neurocutaneous lesions  Neurologic Exam Mental Status: Awake and fully alert.  Attention span, concentration, and fund of knowledge appropriate for age.  Speech with significant dysarthria and she is difficult to understand because of severe oral motor apraxia.  Able to follow some commands and participate in examination. Cranial Nerves: Fundoscopic exam - red reflex present.  Unable to fully visualize fundus.  Pupils equal briskly reactive to  light.  Extraocular movements full without nystagmus.  Visual fields full to confrontation.  Hearing intact and symmetric to whisper.  Facial sensation intact.  Has lower facial weakness with drooling.  Able to only slightly protrude her tongue to midline and to the right. Neck flexion and extension abnormal with poor head control. Motor: Spastic dystonic quadriparesis. Right arm is flexed at the elbow. She is not able to fully extend her fingers. She manipulates the joystick for her wheelchair and her phone with her left hand. She can minimally abduct her legs at the hips. Sensory: Intact to touch and temperature in all extremities. Coordination: Unable to fully assess due to her inability to participate in examination. No dysmetria when reaching for objects. Gait and Station: In a power wheelchair. I did not get her up out of her chair today. Reflexes: Diminished and symmetric. Toes downgoing. No clonus.  Impression 1.   Migraine without aura, improved 2.   Spastic/athetoid quadriparesis 3.   Tension headaches 4.    Dysarthria with oral motor apraxia 5.    History of extreme prematurity 6.    Recent new diagnosis of connective tissue disorder   Recommendations for plan of care The patient's previous [facility] records were reviewed. [name] [name] has neither had nor required imaging or lab studies since the last visit,  other than what was performed at the ER in August. She is aware of those results. [name] [name] is a 42 year old woman with history of spastic/athetoid quadriparesis and migraine without aura. She was prescribed Topiramate for migraine prophylaxis after her last visit in August and has had significant reduction in headache frequency and severity. In addition, she has stopped taking Fioricet daily and I commended her for that. [name] [name] continues to have some headaches that require Tylenol and/or Oxycodone for relief and I recommended that she increase the Topiramate dose in an effort to control the headaches  and reduce her use of analgesics. I reminded her of the need to be well hydrated while taking this medication. I will see her back in follow up in 4 weeks or sooner if needed. [name] [name] agreed with the plans made today.   The medication list was reviewed and reconciled.  I reviewed changes tha twere made in the prescribed medications today.  A complete medication list was provided to the patient.  Allergies as of 08/07/2018      Reactions   Morphine Dermatitis, Other (See Comments)   Skin turned red      Medication List        Accurate as of 08/07/18  1:03 PM. Always use your most recent med list.          acetaminophen 650 MG CR tablet Commonly known as:  TYLENOL Take 1 tablet (650 mg total) by mouth every 8 (eight) hours as needed.   amantadine 100 MG capsule Commonly known as:  SYMMETREL Take 100 mg by mouth daily.   baclofen 20 MG tablet Commonly known as:  LIORESAL Take 20 mg by mouth 4 (four) times daily. For bladder spasms   BELBUCA 450 MCG Film Generic drug:  Buprenorphine HCl Place 450 mg inside cheek every 12 (twelve) hours.   bisacodyl 10 MG suppository Commonly known as:  DULCOLAX Place 10 mg rectally as needed (For constipation.).   busPIRone 5 MG tablet Commonly known as:  BUSPAR Take 5 mg by mouth 3 (three) times daily.   butalbital-acetaminophen-caffeine 50-325-40 MG tablet Commonly known as:  FIORICET, ESGIC Take 2 tablets by mouth every 4 (four) hours as needed for headache (not controlled by Tylenol).   cephALEXin 500 MG capsule Commonly known as:  KEFLEX Take 1 capsule (500 mg total) by mouth 3 (three) times daily.   CERTA-VITE PO Take 1 tablet by mouth daily.   clonazePAM 0.5 MG tablet Commonly known as:  KLONOPIN Take 1.5 tablets (0.75 mg total) by mouth 3 (three) times daily.   feeding supplement (ENSURE ENLIVE) Liqd Take 237 mLs by mouth 2 (two) times daily between meals.   ferrous sulfate 325 (65 FE) MG tablet Take 325 mg by mouth daily.     FLUoxetine 40 MG capsule Commonly known as:  PROZAC Take 80 mg by mouth daily.   hydroxychloroquine 200 MG tablet Commonly known as:  PLAQUENIL Take by mouth daily. 1 daily for 30 days   lamoTRIgine 100 MG tablet Commonly known as:  LAMICTAL Take 100 mg by mouth at bedtime.   linaclotide 145 MCG Caps capsule Commonly known as:  LINZESS Take 145 mcg by mouth daily before breakfast.   lubiprostone 24 MCG capsule Commonly known as:  AMITIZA Take 1 capsule (24 mcg total) by mouth 2 (two) times daily with a meal.   Melatonin 5 MG Tabs Take 5 mg by mouth at bedtime.   OVER THE COUNTER MEDICATION Take 1 Package by mouth  daily. Magic cup   oxybutynin 5 MG tablet Commonly known as:  DITROPAN Take 5 mg by mouth 4 (four) times daily.   polyethylene glycol powder powder Commonly known as:  GLYCOLAX/MIRALAX Take 1  1/2 dose  daily in 12 ounces of fluid every day.   QUEtiapine 50 MG tablet Commonly known as:  SEROQUEL Take 150 mg by mouth at bedtime.   ROXICODONE 5 MG immediate release tablet Generic drug:  oxyCODONE Take 5 mg by mouth every 4 (four) hours as needed for moderate pain or severe pain.   tiZANidine 4 MG capsule Commonly known as:  ZANAFLEX Take 4 mg by mouth 2 (two) times daily.   topiramate 25 MG tablet Commonly known as:  TOPAMAX Take 3 tablets (75mg ) at bedtime for 2 weeks, then take 4 tablets (100mg ) at bedtime   warfarin 10 MG tablet Commonly known as:  COUMADIN Take as directed by the anticoagulation clinic. If you are unsure how to take this medication, talk to your nurse or doctor. Original instructions:  Take 10 mg by mouth daily.       [name]. Sharene [name] was consulted regarding the patient.   Total time spent with the patient was 30 minutes, of which 50% or more was spent in counseling and coordination of care.   [name] Rising NP-C

## 2018-08-07 NOTE — Patient Instructions (Addendum)
Thank you for coming in today.   Instructions for you until your next appointment are as follows: 1. Great job on stopping the Fioricet! 2. Increase the Topiramate 50mg  to 75mg  for 2 weeks at bedtime, then to 100mg  at bedtime. I have written a new prescription for you. 3. Remember that you need to drink a lot of water while taking this medication 3. Try to reduce the amount of Oxycodone that you take for headaches 4. Please plan to return for follow up in 4 weeks or sooner if needed.

## 2018-08-08 DIAGNOSIS — M24541 Contracture, right hand: Secondary | ICD-10-CM | POA: Diagnosis not present

## 2018-08-08 DIAGNOSIS — N390 Urinary tract infection, site not specified: Secondary | ICD-10-CM | POA: Diagnosis not present

## 2018-08-10 ENCOUNTER — Encounter (HOSPITAL_COMMUNITY): Payer: Self-pay

## 2018-08-10 ENCOUNTER — Emergency Department (HOSPITAL_COMMUNITY)
Admission: EM | Admit: 2018-08-10 | Discharge: 2018-08-10 | Disposition: A | Discharge status: Home / Self Care | Payer: Medicare Other | Attending: Emergency Medicine | Admitting: Emergency Medicine

## 2018-08-10 DIAGNOSIS — R404 Transient alteration of awareness: Secondary | ICD-10-CM | POA: Diagnosis not present

## 2018-08-10 DIAGNOSIS — Y828 Other medical devices associated with adverse incidents: Secondary | ICD-10-CM | POA: Insufficient documentation

## 2018-08-10 DIAGNOSIS — Z79899 Other long term (current) drug therapy: Secondary | ICD-10-CM | POA: Diagnosis not present

## 2018-08-10 DIAGNOSIS — G809 Cerebral palsy, unspecified: Secondary | ICD-10-CM | POA: Insufficient documentation

## 2018-08-10 DIAGNOSIS — Z7901 Long term (current) use of anticoagulants: Secondary | ICD-10-CM | POA: Insufficient documentation

## 2018-08-10 DIAGNOSIS — Z7401 Bed confinement status: Secondary | ICD-10-CM | POA: Diagnosis not present

## 2018-08-10 DIAGNOSIS — T83010A Breakdown (mechanical) of cystostomy catheter, initial encounter: Secondary | ICD-10-CM | POA: Insufficient documentation

## 2018-08-10 DIAGNOSIS — T83198A Other mechanical complication of other urinary devices and implants, initial encounter: Secondary | ICD-10-CM | POA: Diagnosis not present

## 2018-08-10 DIAGNOSIS — I10 Essential (primary) hypertension: Secondary | ICD-10-CM | POA: Insufficient documentation

## 2018-08-10 DIAGNOSIS — R0902 Hypoxemia: Secondary | ICD-10-CM | POA: Diagnosis not present

## 2018-08-10 DIAGNOSIS — R52 Pain, unspecified: Secondary | ICD-10-CM | POA: Diagnosis not present

## 2018-08-10 DIAGNOSIS — N390 Urinary tract infection, site not specified: Secondary | ICD-10-CM | POA: Diagnosis not present

## 2018-08-10 DIAGNOSIS — R1084 Generalized abdominal pain: Secondary | ICD-10-CM | POA: Diagnosis not present

## 2018-08-10 DIAGNOSIS — M24541 Contracture, right hand: Secondary | ICD-10-CM | POA: Diagnosis not present

## 2018-08-10 DIAGNOSIS — M2550 Pain in unspecified joint: Secondary | ICD-10-CM | POA: Diagnosis not present

## 2018-08-10 LAB — URINALYSIS, ROUTINE W REFLEX MICROSCOPIC
BILIRUBIN URINE: NEGATIVE
Glucose, UA: NEGATIVE mg/dL
Ketones, ur: NEGATIVE mg/dL
NITRITE: NEGATIVE
PH: 5 (ref 5.0–8.0)
Protein, ur: NEGATIVE mg/dL
RBC / HPF: >50 RBC/hpf — ABNORMAL HIGH (ref 0–5)
Specific Gravity, Urine: 1.021 (ref 1.005–1.030)
WBC, UA: >50 WBC/hpf — ABNORMAL HIGH (ref 0–5)

## 2018-08-10 NOTE — Discharge Instructions (Addendum)
Recheck as needed.  A urine culture was sent tonight, you will be contacted if you need antibiotics.

## 2018-08-10 NOTE — ED Triage Notes (Signed)
Pt arrived by EMS from [place]. EMS reports that Pt's suprapubic catheter started leaking 8 hours prior to arrival. Pt is covered in urine and leaking noted around site

## 2018-08-10 NOTE — ED Notes (Signed)
Bed: CW23 Expected date:  Expected time:  Means of arrival:  Comments: 42 yo F/Suprapubic cath dislodged

## 2018-08-10 NOTE — ED Notes (Signed)
Called report to [facility] about Pt's discharge back to facility. Called [facility] to set up Pt's transport back to facility

## 2018-08-10 NOTE — ED Provider Notes (Signed)
[facility]-EMERGENCY DEPT Provider Note   CSN: [id number] Arrival date & time: 08/10/18  0443  Time seen 05:10 AM    History   Chief Complaint Chief Complaint  Patient presents with  . suprapubic catheter leaking    HPI [name] [name] is a 42 y.o. female.  HPI patient states her suprapubic catheter was changed about 2 weeks ago.  She seems to indicate it usually is done at her doctor's office and it was done somewhere else and she is not sure if it is working properly.  She states it has been leaking.  PCP Lacinda Axon   Past Medical History:  Diagnosis Date  . Abdominal pain 01/02/2012   Overview:  Overview:  Per Previous pcp note- Dr. Ta--Pt has history of endometriosis and had DUB in 2011.  She She had u/s that showed normal endometrial stripe.  She had endometrial bx that was wnl.  She has a lot of abd cramping every month.  This cramping was sometimes not relieved with hydrocodone.  She was referred to [facility] for IUD placement to help endometriosis and abd cramping.  [name]  . Abdominal pain 01/02/2012   Overview:  Overview:  Per Previous pcp note- Dr. Ta--Pt has history of endometriosis and had DUB in 2011.  She She had u/s that showed normal endometrial stripe.  She had endometrial bx that was wnl.  She has a lot of abd cramping every month.  This cramping was sometimes not relieved with hydrocodone.  She was referred to [facility] for IUD placement to help endometriosis and abd cramping.  [name] was placed 04/30/11 by [facility].  Pt still complains of abd cramping, which I am treating with hydrocodone and ultram. Pt reports some initial relief of pain from IUD, but states it has now returned back to similar to previous cramping. Gave patient a shot of Toradol 60 mg IM today-since positive exacerbation of abdominal cramping during past 2 weeks. Physical exam reassuring. Patient may need to return to [facility] for further workup since she is plugged in with the  GYN providers there for further treatment options regarding endometriosis and abdominal cramping. The dysfunctional uterine bleeding now well contr  . Acute cystitis with hematuria   . Acute cystitis without hematuria   . Acute respiratory failure with hypoxia ([name])   . Anemia   . Cellulitis 12/22/2014  . Cerebral palsy ([name])    Spastic Cerebral palsy, mentally intact  . Contracture, joint, multiple sites    Electric wheelchair, uses left hand to operate chair.   . Depression   . Disease of female genital organs 10/24/2010   Overview:  Overview:  Pt has history of endometriosis and had DUB in 2011.  She had u/s that showed normal endometrial stripe.  She had endometrial bx that was wnl.  She has a lot of abd cramping every month.  This cramping was sometimes not relieved with hydrocodone.  She was referred to [facility] for IUD placement to help endometriosis and abd cramping.  [name] was placed 04/30/11 by [facility].  Pt still complains of abd cramping, which was treated with hydrocodone and ultram.   Last seen at [facility] by OB/GYN 12/24/11, given Depo Lupron injection.  Ordering CT scan to r/o additional cause of abdominal pain (pt with known endometriosis and will not tolerate vaginal U/S, pain not improved s/p [name] or with Lupron injection-- only caused hot flashes and amenorrhea, no pain relief).  Also made referral to pain clinic for better  pain control.  Do not think hysterectomy would help with pain, and concern for contamination of baclofen pump if did proceed with surgery.    8/5/13[name]: Pt was seen by   . [name]   . Dysarthria   . Dyspnea and respiratory abnormality 02/04/2011   Overview:  Overview:  Pt has history of recurrent PE and is on chronic coumadin.  Pt has been complaining of dyspnea that she describes as chest tightness.  She has been to the ER multiple times for this.  Usually she gets CTA and serial CE.  She has had at least 6 CTA in a period  of since 2011.  She also has an O2 requirement. It is unclear why pt feels dyspneic, complains of chest tightness, or has O2 requirement.  I thought that her complaint may be from deconditioning and referred her to PT.  Unfortunately Medicaid only pays for 4 PT visits.  Pt should be doing these exercises at home, but she has not.  I have referred her to [facility], and she has been seen by [name].  He did not understand the etiology of her dyspnea and O2 requirement.  He will continue to follow her.   Last Assessment & Plan:  Pt presents with concerns for chest tightness and dyspnea.  She was seen in ED yesterday and discharged home.  While there she had a CT chest 04/20/11 showed 1.  Multifocal degradation as detailed above.  N  . Endometriosis   . GERD (gastroesophageal reflux disease)   . Gram positive sepsis ([name]) 10/24/2014  . Gross hematuria 03/12/2013   Overview:  Overview:  status post replacement of suprapubic tube 03/04/2013  Last Assessment & Plan:  Recent urine culture collected in ED on 5/4 did not result in significant growth. Moreover, patient has been asymptomatic with normal WBC, no fever and no true symptoms making symptomatic UTI less likely.  Will continue to monitor for any sign of infection.  Patient's xarelto had been held during her recent hospitalization due to hematuria. This has since been re-started. Monitor bleeding.    [name] Kitchen HCAP (healthcare-associated pneumonia) 10/25/2014  . HCAP (healthcare-associated pneumonia) 11/24/2017  . History of anticoagulant therapy 10/21/2012  . History of endometriosis 10/2012   OBGYN [facility]: Laparoscopic Endometrial Ablation, TVH,  . History of recurrent UTIs   . Hypertension   . HYPERTENSION, BENIGN 01/31/2010   Qualifier: Diagnosis of  By: [name], MD, [name] Incontinent of feces 03/11/2016  . Infantile cerebral palsy ([name]) 01/29/2007   Overview:  She has spastic CP.  She is mentally intact.  She is wheelchair bound.  She is wheelchair  bound for ambulation      . Intracervical pessary 05/07/2011   Overview:  Overview:  Placed by [facility] 04/30/11 to treat DUB and Endometriosis.  She is seen at GYN clinic at [facility] but was considered a poor surgical candidate and referred her to [facility].  For DUB she had pelvic ultrasound that showed thin stripe and she had endometrial Bx by Dr Jennette Kettle in GYN clinic, which was negative.     . Macroscopic hematuria 03/10/2013   status post replacement of suprapubic tube 03/04/2013   . Migraine   . OCD (obsessive compulsive disorder)   . Parapneumonic effusion 10/25/2014  . Presence of intrathecal baclofen pump 03/07/2014   R LQ. Insertion T10.    [name] Kitchen Psychiatric illness 10/31/2014  . Pulmonary embolism ([name])    Lifetime  Coumadin  . Pulmonary embolism ([name]) 2011, 01/2011   Will be on lifetime coumadin  . Recurrent pulmonary embolism ([name]) 05/03/2011   Pt has history of recurrent PE and is on chronic coumadin.  Pt has been complaining of dyspnea that she describes as chest tightness.  She has been to the ER multiple times for this.  Usually she gets CTA and serial CE.  She has had at least 6 CTA in a period of since 2011.  She also has an O2 requirement. It is unclear why pt feels dyspneic, complains of chest tightness, or has O2 requirement.  I thought that her complaint may be from deconditioning and referred her to PT.  Unfortunately Medicaid only pays for 4 PT visits.  Pt should be doing these exercises at home, but she has not.  I have referred her to [facility], and she has been seen by [name].  He did not understand the etiology of her dyspnea and O2 requirement.  He will continue to follow her.     . Seborrheic keratosis 01/18/2011  . Seizures ([name]) 02/12/2016  . Spasticity 01/05/2014  . Transient alteration of awareness, recurrent 09/08/2006   Recurrent episodes where Shylyn  loses contact with her world and that have been studied thoroughly and appear nonepileptic in nature per Dr Terrace Arabia (neurology) The  patient has had episodes starting in 2007 that initially began 1-4 times a day during which time she would have periods of unawareness followed by confusion. This continuous video EEG monitoring performed from October 8-12, 2007. Had   . Urethra dilated and patulous 07/03/2015   Patulous, Dilated Urethra. 5mL balloon on a 22-Fr catheter hoping this would prevent the bladder spasm from pushing the balloon down the urethra (Dr Logan Bores, Urology [facility], July 2016)     Patient Active Problem List   Diagnosis Date Noted  . Connective tissue disorder ([name]) 08/07/2018  . Migraine without aura and without status migrainosus, not intractable 06/26/2018  . UTI (urinary tract infection) 05/25/2018  . Headache 05/25/2018  . Incontinent of feces 03/11/2016  . Chronic anticoagulation 03/11/2016  . Involuntary movements 02/22/2016  . Seizures ([name]) 02/12/2016  . Dyspnea 02/12/2016  . Cerebral palsy, quadriplegic ([name]) 02/06/2016  . Swelling of extremity 01/13/2016  . Fever   . Pressure ulcer 10/09/2015  . Status post insertion of inferior vena caval filter 09/28/2015  . Absence of bladder continence 08/25/2015  . Person living in residential institution 06/14/2015  . Pulmonary hypertension ([name])   . Anemia of chronic disease   . Spasticity 03/15/2015  . Constipation, chronic 10/10/2014  . Athetoid cerebral palsy ([name]) 04/13/2014  . Dystonia 04/12/2014  . UTI (lower urinary tract infection) 03/16/2014  . Presence of intrathecal baclofen pump 03/07/2014  . Dental caries 07/18/2013  . Multiple thyroid nodules 05/20/2013  . Osteoarthrosis 04/07/2013  . [name]   . Dysarthria   . GERD (gastroesophageal reflux disease)   . Neurogenic bladder 02/05/2013  . Anxiety state 10/21/2012  . Chronic pain syndrome 06/17/2012  . Recurrent pulmonary embolism ([name]) 05/03/2011  . Pulmonary embolism with infarction ([name]) 05/03/2011  . Chronic suprapubic catheter ([name]) 03/15/2011  .  Disease of female genital organs 10/24/2010  . HYPERTENSION, BENIGN 01/31/2010  . CP (cerebral palsy), spastic ([name]) 12/27/2008  . Contracted, joint, multiple sites 12/27/2008  . Major depressive disorder, recurrent episode ([name]) 01/29/2007  . Obsessive Compulsive Disorder 01/29/2007  . Transient alteration of awareness, recurrent 09/08/2006    Past Surgical History:  Procedure Laterality Date  . ABDOMINAL HYSTERECTOMY    . APPENDECTOMY    . BACLOFEN PUMP REFILL     x 3 times  . CARPAL TUNNEL RELEASE  08/2008   Dr Teressa Senter  . CESAREAN SECTION     x 2  . CHOLECYSTECTOMY  10/14/2015   Procedure: LAPAROSCOPIC CHOLECYSTECTOMY;  Surgeon: Violeta Gelinas, MD;  Location: [facility] OR;  Service: General;;  . COLPOSCOPY  06/2000  . INTRAUTERINE DEVICE INSERTION  04/30/11   Inserted by [facility] GYN for endometriosis  . LAPAROSCOPIC ASSISTED VAGINAL HYSTERECTOMY  10/27/2012  . MULTIPLE EXTRACTIONS WITH ALVEOLOPLASTY Bilateral 07/19/2013   Procedure: EXTRACTIONS #4, 1,61,09,60;  Surgeon: Francene Finders, DDS;  Location: [facility] OR;  Service: Oral Surgery;  Laterality: Bilateral;  . NEUROMA SURGERY Left    anterior and posterior intersosseous  . PAIN PUMP IMPLANTATION N/A 03/07/2014   Procedure: baclofen pump revision/replacement and Catheter connection replacement;  Surgeon: Cristi Loron, MD;  Location: [facility] OR[facility];  Service: Neurosurgery;  Laterality: N/A;  baclofen pump revision/replacement and Catheter connection replacement  . PROGRAMABLE BACLOFEN PUMP REVISION  03/17/14   Battery Replacement   . TUBAL LIGATION  2003  . URETHRA SURGERY    . WRIST SURGERY  06/2010   Dr Dierdre Searles, hand surgeon, [name]     OB History   None      Home Medications    Prior to Admission medications   Medication Sig Start Date End Date Taking? Authorizing Provider  acetaminophen (TYLENOL 8 HOUR) 650 MG CR tablet Take 1 tablet (650 mg total) by mouth every 8 (eight) hours as needed. Patient taking differently:  Take 650 mg by mouth every 8 (eight) hours as needed for pain.  07/08/18  Yes Derwood Kaplan, MD  amantadine (SYMMETREL) 100 MG capsule Take 100 mg by mouth daily.    Yes [provider]  baclofen (LIORESAL) 20 MG tablet Take 20 mg by mouth 4 (four) times daily. For bladder spasms   Yes [provider]  Buprenorphine HCl (BELBUCA) 450 MCG FILM Place 450 mg inside cheek every 12 (twelve) hours.   Yes [provider]  busPIRone (BUSPAR) 5 MG tablet Take 5 mg by mouth 3 (three) times daily.   Yes [provider]  clonazePAM (KLONOPIN) 0.5 MG tablet Take 1.5 tablets (0.75 mg total) by mouth 3 (three) times daily. 11/27/17  Yes Narda Bonds, MD  feeding supplement, ENSURE ENLIVE, (ENSURE ENLIVE) LIQD Take 237 mLs by mouth 2 (two) times daily between meals. 05/30/18  Yes Dow Adolph N, DO  ferrous sulfate 325 (65 FE) MG tablet Take 325 mg by mouth daily.    Yes [provider]  FLUoxetine (PROZAC) 40 MG capsule Take 80 mg by mouth daily.   Yes [provider]  lamoTRIgine (LAMICTAL) 100 MG tablet Take 100 mg by mouth at bedtime.   Yes [provider]  linaclotide (LINZESS) 145 MCG CAPS capsule Take 145 mcg by mouth daily before breakfast.   Yes [provider]  Melatonin 5 MG TABS Take 5 mg by mouth at bedtime.    Yes [provider]  Multiple Vitamins-Minerals (CERTA-VITE PO) Take 1 tablet by mouth daily.   Yes [provider]  oxybutynin (DITROPAN) 5 MG tablet Take 5 mg by mouth 4 (four) times daily.  02/20/16  Yes [provider]  oxyCODONE (ROXICODONE) 5 MG immediate release tablet Take 5 mg by mouth every 4 (four) hours as needed for moderate pain or severe  pain.   Yes [provider]  polyethylene glycol powder (GLYCOLAX/MIRALAX) powder Take 1  1/2 dose  daily in 12 ounces of fluid every day. Patient taking differently: Take 17 g by mouth 2 (two) times daily.  01/01/17  Yes Esterwood, [name] S,  PA-C  QUEtiapine (SEROQUEL) 50 MG tablet Take 150 mg by mouth at bedtime.   Yes [provider]  rivaroxaban (XARELTO) 10 MG TABS tablet Take 10 mg by mouth daily.   Yes [provider]  tiZANidine (ZANAFLEX) 4 MG capsule Take 4 mg by mouth 2 (two) times daily.    Yes [provider]  topiramate (TOPAMAX) 25 MG tablet Take 3 tablets (75mg ) at bedtime for 2 weeks, then take 4 tablets (100mg ) at bedtime Patient taking differently: Take 75 mg by mouth See admin instructions. Take 3 tablets (75mg ) at bedtime for 2 weeks, then take 4 tablets (100mg ) at bedtime 08/07/18  Yes Goodpasture, [name], NP  bisacodyl (DULCOLAX) 10 MG suppository Place 10 mg rectally as needed (For constipation.).    [provider]  butalbital-acetaminophen-caffeine (FIORICET, ESGIC) 50-325-40 MG tablet Take 2 tablets by mouth every 4 (four) hours as needed for headache (not controlled by Tylenol). 05/29/18   Darlin Drop, DO  cephALEXin (KEFLEX) 500 MG capsule Take 1 capsule (500 mg total) by mouth 3 (three) times daily. Patient not taking: Reported on 08/10/2018 07/20/18   Lorre Nick, MD  lubiprostone (AMITIZA) 24 MCG capsule Take 1 capsule (24 mcg total) by mouth 2 (two) times daily with a meal. Patient not taking: Reported on 07/07/2018 01/01/17   Sammuel Cooper, PA-C    Family History Family History  Problem Relation Age of Onset  . Asthma Father   . Colon cancer Maternal Grandmother        Died in her 59's  . Breast cancer Paternal Grandmother        Died in her 16's  . Heart attack Maternal Grandfather        Died in his 11's  . Alzheimer's disease Paternal Grandfather        Died in his 44's  . Breast cancer Mother   . Stomach cancer Neg Hx     Social History Social History   Tobacco Use  . Smoking status: Never Smoker  . Smokeless tobacco: Never Used  Substance Use Topics  . Alcohol use: Yes    Alcohol/week: 1.0 standard drinks    Types: 1 Glasses of wine per week     Comment: Drinks alcohol once a month.  . Drug use: No  lives in a NH   Allergies   Morphine   Review of Systems Review of Systems  Unable to perform ROS: Other     Physical Exam Updated Vital Signs BP 116/70 (BP Location: Left Arm)   Pulse (!) 56   Temp 97.6 F (36.4 C) (Oral)   Resp 16   Ht 4\' 11"  (1.499 m)   Wt 49.4 kg   LMP 04/05/2011   SpO2 98%   BMI 22.00 kg/m   Vital signs normal    Physical Exam  Constitutional: She is oriented to person, place, and time.  Patient is noted to have some physical deformities consistent with cerebral palsy with some contractures of her wrist and legs.  HENT:  Head: Normocephalic.  Right Ear: External ear normal.  Left Ear: External ear normal.  Nose: Nose normal.  Eyes: Conjunctivae and EOM are normal.  Cardiovascular: Normal rate.  Pulmonary/Chest: Effort normal.  No respiratory distress.  Abdominal:  Patient has a suprapubic catheter in place, there may be some mild redness around the site, there is some moisture in the area not sure if it is leaking urine.  However her Foley catheter has gross purulence in it.  Musculoskeletal:  Patient has some chronic deformities and contractures  Neurological: She is alert and oriented to person, place, and time.  Patient speech is very difficult to understand.  Skin: Skin is warm and dry.  Psychiatric: She has a normal mood and affect. Her behavior is normal.  Nursing note and vitals reviewed.    ED Treatments / Results  Labs (all labs ordered are listed, but only abnormal results are displayed) Results for orders placed or performed during the hospital encounter of 08/10/18  Urinalysis, Routine w reflex microscopic  Result Value Ref Range   Color, Urine YELLOW YELLOW   APPearance HAZY (A) CLEAR   Specific Gravity, Urine 1.021 1.005 - 1.030   pH 5.0 5.0 - 8.0   Glucose, UA NEGATIVE NEGATIVE mg/dL   Hgb urine dipstick LARGE (A) NEGATIVE   Bilirubin Urine NEGATIVE NEGATIVE     Ketones, ur NEGATIVE NEGATIVE mg/dL   Protein, ur NEGATIVE NEGATIVE mg/dL   Nitrite NEGATIVE NEGATIVE   Leukocytes, UA LARGE (A) NEGATIVE   RBC / HPF >50 (H) 0 - 5 RBC/hpf   WBC, UA >50 (H) 0 - 5 WBC/hpf   Bacteria, UA MANY (A) NONE SEEN   Squamous Epithelial / LPF 0-5 0 - 5   Mucus PRESENT    Laboratory interpretation all normal except questionable UTI, there are no nitrates present.    EKG None  Radiology No results found.  Procedures SUPRAPUBIC TUBE PLACEMENT Date/Time: 08/10/2018 6:33 AM Performed by: Devoria Albe, MD Authorized by: Devoria Albe, MD   Consent:    Consent obtained:  Verbal   Consent given by:  Patient Anesthesia (see MAR for exact dosages):    Anesthesia method:  None Procedure details:    Complexity:  Simple   Catheter type:  Foley   Catheter size:  20 Fr   Ultrasound guidance: no     Number of attempts:  1   Urine characteristics:  Clear and mildly cloudy Post-procedure details:    Patient tolerance of procedure:  Tolerated well, no immediate complications Comments:     When the nurse remove the old catheter I reinserted a new 20 French Foley catheter and there was immediate return of yellow urine.  It looked much different from what was in the tube of her old Foley catheter.   (including critical care time)  Medications Ordered in ED Medications - No data to display   Initial Impression / Assessment and Plan / ED Course  I have reviewed the triage vital signs and the nursing notes.  Pertinent labs & imaging results that were available during my care of the patient were reviewed by me and considered in my medical decision making (see chart for details).     Patient's old Foley was a 46 [place], a 90 [place] was used today, the old one had a 10 cc balloon and this 1 has a 5 cc balloon.  Review of her labs show she had a urine culture done on August 19.  It showed multiple species.  She had been given Rocephin in the ED and discharged home with  Keflex.  At this point I sent a urine culture and will wait for those results.  Her urine looked much  clearer after the tubing was changed.  She has a chronic indwelling Foley and probably has chronic leukocytosis present.  Final Clinical Impressions(s) / ED Diagnoses   Final diagnoses:  Suprapubic catheter dysfunction, initial encounter Dr [facility])    ED Discharge Orders    None      Plan discharge  Devoria Albe, MD, Concha Pyo, MD 08/10/18 [phone]

## 2018-08-11 DIAGNOSIS — M24541 Contracture, right hand: Secondary | ICD-10-CM | POA: Diagnosis not present

## 2018-08-11 DIAGNOSIS — N390 Urinary tract infection, site not specified: Secondary | ICD-10-CM | POA: Diagnosis not present

## 2018-08-11 LAB — URINE CULTURE: SPECIAL REQUESTS: NORMAL

## 2018-08-12 DIAGNOSIS — I803 Phlebitis and thrombophlebitis of lower extremities, unspecified: Secondary | ICD-10-CM | POA: Diagnosis not present

## 2018-08-12 DIAGNOSIS — G809 Cerebral palsy, unspecified: Secondary | ICD-10-CM | POA: Diagnosis not present

## 2018-08-12 DIAGNOSIS — N390 Urinary tract infection, site not specified: Secondary | ICD-10-CM | POA: Diagnosis not present

## 2018-08-12 DIAGNOSIS — M24541 Contracture, right hand: Secondary | ICD-10-CM | POA: Diagnosis not present

## 2018-08-12 DIAGNOSIS — L603 Nail dystrophy: Secondary | ICD-10-CM | POA: Diagnosis not present

## 2018-08-12 DIAGNOSIS — G8220 Paraplegia, unspecified: Secondary | ICD-10-CM | POA: Diagnosis not present

## 2018-08-13 DIAGNOSIS — N390 Urinary tract infection, site not specified: Secondary | ICD-10-CM | POA: Diagnosis not present

## 2018-08-13 DIAGNOSIS — M24541 Contracture, right hand: Secondary | ICD-10-CM | POA: Diagnosis not present

## 2018-08-14 DIAGNOSIS — M24541 Contracture, right hand: Secondary | ICD-10-CM | POA: Diagnosis not present

## 2018-08-14 DIAGNOSIS — N390 Urinary tract infection, site not specified: Secondary | ICD-10-CM | POA: Diagnosis not present

## 2018-08-16 ENCOUNTER — Emergency Department (HOSPITAL_COMMUNITY): Payer: Medicare Other

## 2018-08-16 ENCOUNTER — Emergency Department (HOSPITAL_COMMUNITY)
Admission: EM | Admit: 2018-08-16 | Discharge: 2018-08-16 | Disposition: A | Discharge status: Skilled Nursing Facility | Payer: Medicare Other | Source: Home / Self Care | Attending: Emergency Medicine | Admitting: Emergency Medicine

## 2018-08-16 DIAGNOSIS — I959 Hypotension, unspecified: Secondary | ICD-10-CM | POA: Diagnosis not present

## 2018-08-16 DIAGNOSIS — Z7401 Bed confinement status: Secondary | ICD-10-CM | POA: Diagnosis not present

## 2018-08-16 DIAGNOSIS — R05 Cough: Secondary | ICD-10-CM | POA: Insufficient documentation

## 2018-08-16 DIAGNOSIS — Z79899 Other long term (current) drug therapy: Secondary | ICD-10-CM

## 2018-08-16 DIAGNOSIS — A419 Sepsis, unspecified organism: Secondary | ICD-10-CM | POA: Diagnosis not present

## 2018-08-16 DIAGNOSIS — R911 Solitary pulmonary nodule: Secondary | ICD-10-CM | POA: Diagnosis not present

## 2018-08-16 DIAGNOSIS — R509 Fever, unspecified: Secondary | ICD-10-CM | POA: Diagnosis not present

## 2018-08-16 DIAGNOSIS — F339 Major depressive disorder, recurrent, unspecified: Secondary | ICD-10-CM | POA: Diagnosis not present

## 2018-08-16 DIAGNOSIS — M542 Cervicalgia: Secondary | ICD-10-CM | POA: Insufficient documentation

## 2018-08-16 DIAGNOSIS — T83511A Infection and inflammatory reaction due to indwelling urethral catheter, initial encounter: Secondary | ICD-10-CM | POA: Diagnosis not present

## 2018-08-16 DIAGNOSIS — I10 Essential (primary) hypertension: Secondary | ICD-10-CM | POA: Insufficient documentation

## 2018-08-16 DIAGNOSIS — N390 Urinary tract infection, site not specified: Secondary | ICD-10-CM

## 2018-08-16 DIAGNOSIS — G800 Spastic quadriplegic cerebral palsy: Secondary | ICD-10-CM | POA: Diagnosis not present

## 2018-08-16 DIAGNOSIS — E872 Acidosis: Secondary | ICD-10-CM | POA: Diagnosis not present

## 2018-08-16 DIAGNOSIS — T83518A Infection and inflammatory reaction due to other urinary catheter, initial encounter: Secondary | ICD-10-CM | POA: Diagnosis not present

## 2018-08-16 DIAGNOSIS — N289 Disorder of kidney and ureter, unspecified: Secondary | ICD-10-CM | POA: Diagnosis not present

## 2018-08-16 DIAGNOSIS — J9811 Atelectasis: Secondary | ICD-10-CM | POA: Diagnosis not present

## 2018-08-16 DIAGNOSIS — R5383 Other fatigue: Secondary | ICD-10-CM | POA: Diagnosis not present

## 2018-08-16 DIAGNOSIS — M2550 Pain in unspecified joint: Secondary | ICD-10-CM | POA: Diagnosis not present

## 2018-08-16 LAB — URINALYSIS, ROUTINE W REFLEX MICROSCOPIC
BILIRUBIN URINE: NEGATIVE
GLUCOSE, UA: NEGATIVE mg/dL
HGB URINE DIPSTICK: NEGATIVE
Ketones, ur: NEGATIVE mg/dL
NITRITE: POSITIVE — AB
PROTEIN: 30 mg/dL — AB
SPECIFIC GRAVITY, URINE: 1.017 (ref 1.005–1.030)
pH: 6 (ref 5.0–8.0)

## 2018-08-16 LAB — BASIC METABOLIC PANEL
ANION GAP: 6 (ref 5–15)
BUN: 15 mg/dL (ref 6–20)
CALCIUM: 8.8 mg/dL — AB (ref 8.9–10.3)
CO2: 29 mmol/L (ref 22–32)
Chloride: 106 mmol/L (ref 98–111)
Creatinine, Ser: 0.63 mg/dL (ref 0.44–1.00)
GLUCOSE: 128 mg/dL — AB (ref 70–99)
Potassium: 4.2 mmol/L (ref 3.5–5.1)
SODIUM: 141 mmol/L (ref 135–145)

## 2018-08-16 LAB — CBC WITH DIFFERENTIAL/PLATELET
BASOS ABS: 0 10*3/uL (ref 0.0–0.1)
BASOS PCT: 0 %
EOS ABS: 0.7 10*3/uL (ref 0.0–0.7)
EOS PCT: 12 %
HCT: 39.4 % (ref 36.0–46.0)
Hemoglobin: 12.8 g/dL (ref 12.0–15.0)
Lymphocytes Relative: 35 %
Lymphs Abs: 1.9 10*3/uL (ref 0.7–4.0)
MCH: 30.4 pg (ref 26.0–34.0)
MCHC: 32.5 g/dL (ref 30.0–36.0)
MCV: 93.6 fL (ref 78.0–100.0)
MONO ABS: 0.4 10*3/uL (ref 0.1–1.0)
Monocytes Relative: 7 %
Neutro Abs: 2.5 10*3/uL (ref 1.7–7.7)
Neutrophils Relative %: 46 %
PLATELETS: 216 10*3/uL (ref 150–400)
RBC: 4.21 MIL/uL (ref 3.87–5.11)
RDW: 14.1 % (ref 11.5–15.5)
WBC: 5.4 10*3/uL (ref 4.0–10.5)

## 2018-08-16 MED ORDER — FLUCONAZOLE 200 MG PO TABS: 200.0000 mg | ORAL_TABLET | Freq: Every day | ORAL | 0 refills | Status: AC

## 2018-08-16 MED ORDER — SODIUM CHLORIDE 0.9 % IV BOLUS
500.0000 mL | Freq: Once | INTRAVENOUS | Status: AC
  Administered 2018-08-16: 500 mL via INTRAVENOUS

## 2018-08-16 MED ORDER — SULFAMETHOXAZOLE-TRIMETHOPRIM 800-160 MG PO TABS: 1.0000 | ORAL_TABLET | Freq: Two times a day (BID) | ORAL | 0 refills | Status: AC

## 2018-08-16 MED ORDER — FLUCONAZOLE 200 MG PO TABS
200.0000 mg | ORAL_TABLET | Freq: Once | ORAL | Status: AC
  Administered 2018-08-16: 200 mg via ORAL
  Filled 2018-08-16: qty 1

## 2018-08-16 MED ORDER — SULFAMETHOXAZOLE-TRIMETHOPRIM 800-160 MG PO TABS
1.0000 | ORAL_TABLET | Freq: Once | ORAL | Status: AC
  Administered 2018-08-16: 1 via ORAL
  Filled 2018-08-16: qty 1

## 2018-08-16 NOTE — Discharge Instructions (Addendum)
Please return to the Emergency Department for any new or worsening symptoms or if your symptoms do not improve. Please be sure to follow up with your Primary Care Physician as soon as possible regarding your visit today. If you do not have a Primary Doctor please use the resources below to establish one. Your urinalysis showed signs of both bacterial and fungal infections.  Please take the medications Bactrim and fluconazole as prescribed to treat both of these.  Your urine has been sent for culture and you will be contacted by [facility] if it grows out bacteria that necessitate different management. Please follow-up with your urologist as soon as possible regarding your visit today.  It is possible that your urologist will want to change your catheter tubing.  Contact a health care provider if: You have back pain. You have a fever. You feel nauseous or vomit. Your symptoms do not get better after 3 days. Your symptoms go away and then return. Get help right away if: You have severe back pain or lower abdominal pain. You are vomiting and cannot keep down any medicines or water.  RESOURCE GUIDE  Chronic Pain Problems: Contact [facility]  [phone] Patients need to be referred by their primary care doctor.  Insufficient Money for Medicine: Contact United Way:  call "211" or [facility] (574)541-3773.  No Primary Care Doctor: Call [facility]  4583871043 - can help you locate a primary care doctor that  accepts your insurance, provides certain services, etc. Physician Referral Service- [phone]  Agencies that provide inexpensive medical care: [facility]  [phone] [facility] Medicine  [phone] [facility]  [phone] [facility]  [phone] Planned [facility]  [phone] [facility]  [phone]  Medicaid-accepting [facility] Providers: [facility]- [address]  [phone], Mon-Fri 9am-7pm, Sat 9am-1pm [facility]- [address] [place], [address] [place]  [phone] [facility]- [address] Suite [place]  [phone] [facility]- [address]  [phone] Renaye Rakers- [address] [place], [address], [phone]  Only accepts [place] patients after they have their name  applied to their card  Self Pay (no insurance) in [facility]: Sickle Cell Patients: Dr Willey Blade, [facility]  [address] [place], [phone] [facility]- [address] [place]  [phone]       [facility] [place]- [address]       -     [facility]- see information above (Speak to [employer] if you do not have insurance)       -  [facility]- [address] [place], [phone]       -  [facility]- [address],  [phone]       -  [facility]- [address], [phone]       -  Dr Julio Sicks-  [address], [address], [place], [phone]       -  [facility]- [address], [phone]       -  [facility]- [address] [phone], also [address] [phone]       -    [facility]- [address] [place], [phone], 1st & 3rd Saturday   every  month, 10am-1pm  1) Find a Librarian, academic and Pay Out of Pocket Although you won't have to find out who is covered by your insurance plan, it is a good idea to ask around and get recommendations. You will then need to call the office and see if the doctor you have chosen will accept you as a new patient and what types of options they offer for patients who are self-pay. Some doctors offer discounts or will set up payment plans for their patients who do not have insurance, but you will need to ask so you aren't surprised when you get to your appointment.  2) Contact Your Local Health Department Not all health departments have doctors that  can see patients for sick visits, but many do, so it is worth a call to see if yours does. If you don't know where your local health department is, you can check in your phone book. The CDC also has a tool to help you locate your state's health department, and many state websites also have listings of all of their local health departments.  3) Find a Walk-in Clinic If your illness is not likely to be very severe or complicated, you may want to try a walk in clinic. These are popping up all over the country in pharmacies, drugstores, and shopping centers. They're usually staffed by nurse practitioners or physician assistants that have been trained to treat common illnesses and complaints. They're usually fairly quick and inexpensive. However, if you have serious medical issues or chronic medical problems, these are probably not your best option  STD Testing [facility] of [facility], STD Clinic, [address] [place], phone [phone] or [phone].  Monday - Friday, call for an appointment. [facility] Department of [employer], STD Clinic, [address], [place], phone [phone] or [phone].  Monday - Friday, call for an appointment.  Abuse/Neglect: [facility] Child Abuse Hotline 682 063 1912 [facility] Child Abuse Hotline (715)872-1616 (After Hours)  Emergency Shelter:  [facility] 507-155-1359  Maternity Homes: [facility] [place] [facility] 517-841-2451 [facility] (615)513-1932  MRSA Hotline #:   806-114-5745  [facility] Resources  [facility] [place]  [employer] [facility] Dept. [address]                 [address]         [address] [place] [address]  Blondell Reveal Phone:  [phone]                                  Phone:  [phone]                    Phone:  [phone]  [facility], [phone] [phone]       -     [facility] in [place],  [address],                                  [phone], Insurance  [place] Child Abuse Hotline (301)246-4641(336) 989-643-3704 or (343) 218-4846(336) (506) 369-0690 (After Hours)   Behavioral Health Services  Substance Abuse Resources: [facility] Drug [facility]  [phone] [facility] (207)[phone] [facility] ([phone] [facility] ([phone] Residential & Outpatient Substance Abuse Program  [phone]  Psychological Services: [facility]  [phone] [facility]  [phone] [facility], [phone] [place] [address], [place], ACCESS LINE: [phone] or [phone], [email or web address]  Dental Assistance  If unable to pay or uninsured, contact:  [facility] or [facility] to become qualified for the adult dental clinic.  Patients with Medicaid: [facility] [phone], [phone] [address], [phone]  If unable to pay, or uninsured, contact [facility] [phone]) or [facility] [phone] in [place], [phone] in [facility]) to become qualified for the adult dental clinic   Other Low-Cost Community Dental Services: [facility]- [address], [place], [place], [phone], [phone], [address]. [address], 2nd and 4th Thursday of the month at 6:30am.  10 clients each day by appointment, can sometimes see walk-in patients if someone does not show for an appointment. [facility]- [address], [place], [place], [phone], [phone] [facility]- [address], [place], [place], [phone], [phone] [facility]- [phone] [facility]- [phone] [facility]  [phone]- [phone]

## 2018-08-16 NOTE — ED Notes (Signed)
Bed: WA08 Expected date:  Expected time:  Means of arrival:  Comments: 42 yo fatigue

## 2018-08-16 NOTE — ED Notes (Signed)
ED Provider at bedside. 

## 2018-08-16 NOTE — ED Triage Notes (Signed)
[facility]--patient states that she has been feeling more fatigued lately, + neck pain. Hx of cerebral palsy. AAO x 4.

## 2018-08-16 NOTE — ED Provider Notes (Signed)
[facility]-EMERGENCY DEPT Provider Note   CSN: [id number] Arrival date & time: 08/16/18  1220     History   Chief Complaint Chief Complaint  Patient presents with  . Fatigue    HPI [name] [name] is a 42 y.o. female with extensive medical history including cerebral palsy, suprapubic tube, multiple contractures presenting for 2 days of increasing fatigue.  Patient states that she has felt more sleepy than she normally does.  Patient denies history of fevers or any new or unusual pain today.  Patient states that she has had a mild occasional nonproductive cough over the past few weeks as well.  Patient also states she has had increasing neck pain over the past few weeks, she states that she has rods in her neck and this chronically gives her pain but she feels that pain has slightly increased over the past 2-3 weeks.  Describes pain as a dull throbbing pain that is constant and worsened with movement.  Denies history of injury or trauma to the area.  Patient's only other complaint today is increasing stiffness of the left arm.  Patient has contractures and stiffness in all 4 extremities however she states that normally her left arm is a little better than her other extremities and she is able to do most of her daily activities with this arm however she has felt that over the past 2 weeks she has had increased stiffness in this arm.  Patient still is able to complete her activities of daily living with the arm however is becoming concerned of increasing contractures.  HPI  Past Medical History:  Diagnosis Date  . Abdominal pain 01/02/2012   Overview:  Overview:  Per Previous pcp note- Dr. Ta--Pt has history of endometriosis and had DUB in 2011.  She She had u/s that showed normal endometrial stripe.  She had endometrial bx that was wnl.  She has a lot of abd cramping every month.  This cramping was sometimes not relieved with hydrocodone.  She was referred to [facility]  for IUD placement to help endometriosis and abd cramping.  [name]  . Abdominal pain 01/02/2012   Overview:  Overview:  Per Previous pcp note- Dr. Ta--Pt has history of endometriosis and had DUB in 2011.  She She had u/s that showed normal endometrial stripe.  She had endometrial bx that was wnl.  She has a lot of abd cramping every month.  This cramping was sometimes not relieved with hydrocodone.  She was referred to [facility] for IUD placement to help endometriosis and abd cramping.  [name] was placed 04/30/11 by [facility].  Pt still complains of abd cramping, which I am treating with hydrocodone and ultram. Pt reports some initial relief of pain from IUD, but states it has now returned back to similar to previous cramping. Gave patient a shot of Toradol 60 mg IM today-since positive exacerbation of abdominal cramping during past 2 weeks. Physical exam reassuring. Patient may need to return to [facility] for further workup since she is plugged in with the GYN providers there for further treatment options regarding endometriosis and abdominal cramping. The dysfunctional uterine bleeding now well contr  . Acute cystitis with hematuria   . Acute cystitis without hematuria   . Acute respiratory failure with hypoxia ([name])   . Anemia   . Cellulitis 12/22/2014  . Cerebral palsy ([name])    Spastic Cerebral palsy, mentally intact  . Contracture, joint, multiple sites    Electric wheelchair, uses left  hand to operate chair.   . Depression   . Disease of female genital organs 10/24/2010   Overview:  Overview:  Pt has history of endometriosis and had DUB in 2011.  She had u/s that showed normal endometrial stripe.  She had endometrial bx that was wnl.  She has a lot of abd cramping every month.  This cramping was sometimes not relieved with hydrocodone.  She was referred to [facility] for IUD placement to help endometriosis and abd cramping.  [name] was placed 04/30/11 by [facility].  Pt still complains of abd cramping,  which was treated with hydrocodone and ultram.   Last seen at [facility] by OB/GYN 12/24/11, given Depo Lupron injection.  Ordering CT scan to r/o additional cause of abdominal pain (pt with known endometriosis and will not tolerate vaginal U/S, pain not improved s/p [name] or with Lupron injection-- only caused hot flashes and amenorrhea, no pain relief).  Also made referral to pain clinic for better pain control.  Do not think hysterectomy would help with pain, and concern for contamination of baclofen pump if did proceed with surgery.    8/5/13[name]: Pt was seen by   . [name]   . Dysarthria   . Dyspnea and respiratory abnormality 02/04/2011   Overview:  Overview:  Pt has history of recurrent PE and is on chronic coumadin.  Pt has been complaining of dyspnea that she describes as chest tightness.  She has been to the ER multiple times for this.  Usually she gets CTA and serial CE.  She has had at least 6 CTA in a period of since 2011.  She also has an O2 requirement. It is unclear why pt feels dyspneic, complains of chest tightness, or has O2 requirement.  I thought that her complaint may be from deconditioning and referred her to PT.  Unfortunately Medicaid only pays for 4 PT visits.  Pt should be doing these exercises at home, but she has not.  I have referred her to [facility], and she has been seen by [name].  He did not understand the etiology of her dyspnea and O2 requirement.  He will continue to follow her.   Last Assessment & Plan:  Pt presents with concerns for chest tightness and dyspnea.  She was seen in ED yesterday and discharged home.  While there she had a CT chest 04/20/11 showed 1.  Multifocal degradation as detailed above.  N  . Endometriosis   . GERD (gastroesophageal reflux disease)   . Gram positive sepsis ([name]) 10/24/2014  . Gross hematuria 03/12/2013   Overview:  Overview:  status post replacement of suprapubic tube 03/04/2013  Last Assessment & Plan:   Recent urine culture collected in ED on 5/4 did not result in significant growth. Moreover, patient has been asymptomatic with normal WBC, no fever and no true symptoms making symptomatic UTI less likely.  Will continue to monitor for any sign of infection.  Patient's xarelto had been held during her recent hospitalization due to hematuria. This has since been re-started. Monitor bleeding.    [name] Kitchen HCAP (healthcare-associated pneumonia) 10/25/2014  . HCAP (healthcare-associated pneumonia) 11/24/2017  . History of anticoagulant therapy 10/21/2012  . History of endometriosis 10/2012   OBGYN [facility]: Laparoscopic Endometrial Ablation, TVH,  . History of recurrent UTIs   . Hypertension   . HYPERTENSION, BENIGN 01/31/2010   Qualifier: Diagnosis of  By: [name], MD, [name] Incontinent of feces 03/11/2016  .  Infantile cerebral palsy ([name]) 01/29/2007   Overview:  She has spastic CP.  She is mentally intact.  She is wheelchair bound.  She is wheelchair bound for ambulation      . Intracervical pessary 05/07/2011   Overview:  Overview:  Placed by [facility] 04/30/11 to treat DUB and Endometriosis.  She is seen at GYN clinic at [facility] but was considered a poor surgical candidate and referred her to [facility].  For DUB she had pelvic ultrasound that showed thin stripe and she had endometrial Bx by Dr Jennette Kettle in GYN clinic, which was negative.     . Macroscopic hematuria 03/10/2013   status post replacement of suprapubic tube 03/04/2013   . Migraine   . OCD (obsessive compulsive disorder)   . Parapneumonic effusion 10/25/2014  . Presence of intrathecal baclofen pump 03/07/2014   R LQ. Insertion T10.    [name] Kitchen Psychiatric illness 10/31/2014  . Pulmonary embolism ([name])    Lifetime Coumadin  . Pulmonary embolism ([name]) 2011, 01/2011   Will be on lifetime coumadin  . Recurrent pulmonary embolism ([name]) 05/03/2011   Pt has history of recurrent PE and is on chronic coumadin.  Pt has been complaining of dyspnea that she  describes as chest tightness.  She has been to the ER multiple times for this.  Usually she gets CTA and serial CE.  She has had at least 6 CTA in a period of since 2011.  She also has an O2 requirement. It is unclear why pt feels dyspneic, complains of chest tightness, or has O2 requirement.  I thought that her complaint may be from deconditioning and referred her to PT.  Unfortunately Medicaid only pays for 4 PT visits.  Pt should be doing these exercises at home, but she has not.  I have referred her to [facility], and she has been seen by [name].  He did not understand the etiology of her dyspnea and O2 requirement.  He will continue to follow her.     . Seborrheic keratosis 01/18/2011  . Seizures ([name]) 02/12/2016  . Spasticity 01/05/2014  . Transient alteration of awareness, recurrent 09/08/2006   Recurrent episodes where Mattie  loses contact with her world and that have been studied thoroughly and appear nonepileptic in nature per Dr Terrace Arabia (neurology) The patient has had episodes starting in 2007 that initially began 1-4 times a day during which time she would have periods of unawareness followed by confusion. This continuous video EEG monitoring performed from October 8-12, 2007. Had   . Urethra dilated and patulous 07/03/2015   Patulous, Dilated Urethra. 5mL balloon on a 22-Fr catheter hoping this would prevent the bladder spasm from pushing the balloon down the urethra (Dr Logan Bores, Urology [facility], July 2016)     Patient Active Problem List   Diagnosis Date Noted  . Connective tissue disorder ([name]) 08/07/2018  . Migraine without aura and without status migrainosus, not intractable 06/26/2018  . UTI (urinary tract infection) 05/25/2018  . Headache 05/25/2018  . Incontinent of feces 03/11/2016  . Chronic anticoagulation 03/11/2016  . Involuntary movements 02/22/2016  . Seizures ([name]) 02/12/2016  . Dyspnea 02/12/2016  . Cerebral palsy, quadriplegic ([name]) 02/06/2016  . Swelling of extremity  01/13/2016  . Fever   . Pressure ulcer 10/09/2015  . Status post insertion of inferior vena caval filter 09/28/2015  . Absence of bladder continence 08/25/2015  . Person living in residential institution 06/14/2015  . Pulmonary hypertension ([name])   . Anemia of chronic  disease   . Spasticity 03/15/2015  . Constipation, chronic 10/10/2014  . Athetoid cerebral palsy ([name]) 04/13/2014  . Dystonia 04/12/2014  . UTI (lower urinary tract infection) 03/16/2014  . Presence of intrathecal baclofen pump 03/07/2014  . Dental caries 07/18/2013  . Multiple thyroid nodules 05/20/2013  . Osteoarthrosis 04/07/2013  . [name]   . Dysarthria   . GERD (gastroesophageal reflux disease)   . Neurogenic bladder 02/05/2013  . Anxiety state 10/21/2012  . Chronic pain syndrome 06/17/2012  . Recurrent pulmonary embolism ([name]) 05/03/2011  . Pulmonary embolism with infarction ([name]) 05/03/2011  . Chronic suprapubic catheter ([name]) 03/15/2011  . Disease of female genital organs 10/24/2010  . HYPERTENSION, BENIGN 01/31/2010  . CP (cerebral palsy), spastic ([name]) 12/27/2008  . Contracted, joint, multiple sites 12/27/2008  . Major depressive disorder, recurrent episode ([name]) 01/29/2007  . Obsessive Compulsive Disorder 01/29/2007  . Transient alteration of awareness, recurrent 09/08/2006    Past Surgical History:  Procedure Laterality Date  . ABDOMINAL HYSTERECTOMY    . APPENDECTOMY    . BACLOFEN PUMP REFILL     x 3 times  . CARPAL TUNNEL RELEASE  08/2008   Dr Teressa SenterSypher  . CESAREAN SECTION     x 2  . CHOLECYSTECTOMY  10/14/2015   Procedure: LAPAROSCOPIC CHOLECYSTECTOMY;  Surgeon: Violeta GelinasBurke Thompson, MD;  [facility];  Service: General;;  . COLPOSCOPY  06/2000  . INTRAUTERINE DEVICE INSERTION  04/30/11   [facility]  . LAPAROSCOPIC ASSISTED VAGINAL HYSTERECTOMY  10/27/2012  . MULTIPLE EXTRACTIONS WITH ALVEOLOPLASTY Bilateral 07/19/2013   Procedure:  EXTRACTIONS #4, 1,61,09,605,11,16,19;  Surgeon: Francene Findershristopher L [place], DDS;  [facility];  Service: Oral Surgery;  Laterality: Bilateral;  . NEUROMA SURGERY Left    anterior and posterior intersosseous  . PAIN PUMP IMPLANTATION N/A 03/07/2014   Procedure: baclofen pump revision/replacement and Catheter connection replacement;  Surgeon: Cristi LoronJeffrey D Jenkins, MD;  Location: [facility] OR[facility];  Service: Neurosurgery;  Laterality: N/A;  baclofen pump revision/replacement and Catheter connection replacement  . PROGRAMABLE BACLOFEN PUMP REVISION  03/17/14   Battery Replacement   . TUBAL LIGATION  2003  . URETHRA SURGERY    . WRIST SURGERY  06/2010   Dr Dierd[facility] SearlesLi[facility]     [facility] History   None      Home Medications    Prior to Admission medications   Medication Sig Start Date End Date Taking? Authorizing Provider  acetaminophen (TYLENOL 8 HOUR) 650 MG CR tablet Take 1 tablet (650 mg total) by mouth every 8 (eight) hours as needed. Patient taking differently: Take 650 mg by mouth every 8 (eight) hours as needed for pain.  07/08/18  Yes Derwood KaplanNanavati, Ankit, MD  amantadine (SYMMETREL) 100 MG capsule Take 100 mg by mouth daily.    Yes [provider]  baclofen (LIORESAL) 20 MG tablet Take 20 mg by mouth 4 (four) times daily. For bladder spasms   Yes [provider]  bisacodyl (DULCOLAX) 10 MG suppository Place 10 mg rectally 2 (two) times daily as needed for moderate constipation.    Yes [provider]  Buprenorphine HCl (BELBUCA) 450 MCG FILM Place 450 mg inside cheek every 12 (twelve) hours.   Yes [provider]  busPIRone (BUSPAR) 5 MG tablet Take 5 mg by mouth 3 (three) times daily.   Yes [provider]  butalbital-acetaminophen-caffeine (FIORICET, ESGIC) 50-325-40 MG tablet Take 2 tablets by mouth every 4 (four) hours as needed for  headache (not controlled by Tylenol). 05/29/18  Yes Hall, Carole N, DO  clonazePAM (KLONOPIN) 0.5 MG tablet Take 1.5 tablets (0.75 mg  total) by mouth 3 (three) times daily. 11/27/17  Yes Narda Bonds, MD  feeding supplement, ENSURE ENLIVE, (ENSURE ENLIVE) LIQD Take 237 mLs by mouth 2 (two) times daily between meals. 05/30/18  Yes Dow Adolph N, DO  ferrous sulfate 325 (65 FE) MG tablet Take 325 mg by mouth daily.    Yes [provider]  FLUoxetine (PROZAC) 40 MG capsule Take 80 mg by mouth daily.   Yes [provider]  lactulose (CHRONULAC) 10 GM/15ML solution Take 20 g by mouth daily as needed for mild constipation or moderate constipation.   Yes [provider]  lamoTRIgine (LAMICTAL) 100 MG tablet Take 100 mg by mouth at bedtime.   Yes [provider]  linaclotide (LINZESS) 145 MCG CAPS capsule Take 145 mcg by mouth daily before breakfast.   Yes [provider]  Melatonin 5 MG TABS Take 5 mg by mouth at bedtime.    Yes [provider]  Multiple Vitamins-Minerals (CERTA-VITE PO) Take 1 tablet by mouth daily.   Yes [provider]  oxybutynin (DITROPAN) 5 MG tablet Take 5 mg by mouth 4 (four) times daily.  02/20/16  Yes [provider]  oxyCODONE (ROXICODONE) 5 MG immediate release tablet Take 5 mg by mouth every 4 (four) hours as needed for moderate pain or severe pain.   Yes [provider]  polyethylene glycol powder (GLYCOLAX/MIRALAX) powder Take 1  1/2 dose  daily in 12 ounces of fluid every day. Patient taking differently: Take 17 g by mouth 2 (two) times daily.  01/01/17  Yes Esterwood, [name] S, PA-C  QUEtiapine (SEROQUEL) 50 MG tablet Take 150 mg by mouth at bedtime.   Yes [provider]  rivaroxaban (XARELTO) 10 MG TABS tablet Take 10 mg by mouth daily.   Yes [provider]  tiZANidine (ZANAFLEX) 4 MG capsule Take 4 mg by mouth 2 (two) times daily.    Yes [provider]  topiramate (TOPAMAX) 25 MG tablet Take 3 tablets (75mg ) at bedtime for 2 weeks, then take 4 tablets (100mg ) at bedtime Patient taking  differently: Take 75-100 mg by mouth See admin instructions. Take 75 mg by mouth at bedtime for 2 weeks until 08/21/18. Then start 100 mg by mouth at bedtime on 08/22/18 08/07/18  Yes Goodpasture, [name], NP  fluconazole (DIFLUCAN) 200 MG tablet Take 1 tablet (200 mg total) by mouth daily for 14 days. 08/16/18 08/30/18  Harlene Salts A, PA-C  lubiprostone (AMITIZA) 24 MCG capsule Take 1 capsule (24 mcg total) by mouth 2 (two) times daily with a meal. Patient not taking: Reported on 07/07/2018 01/01/17   Esterwood, [name] S, PA-C  sulfamethoxazole-trimethoprim (BACTRIM DS,SEPTRA DS) 800-160 MG tablet Take 1 tablet by mouth 2 (two) times daily for 7 days. 08/16/18 08/23/18  Bill Salinas, PA-C    Family History Family History  Problem Relation Age of Onset  . Asthma Father   . Colon cancer Maternal Grandmother        Died in her 21's  . Breast cancer Paternal Grandmother        Died in her 22's  . Heart attack Maternal Grandfather        Died in his 55's  . Alzheimer's disease Paternal Grandfather        Died in his 59's  . Breast cancer Mother   .  Stomach cancer Neg Hx     Social History Social History   Tobacco Use  . Smoking status: Never Smoker  . Smokeless tobacco: Never Used  Substance Use Topics  . Alcohol use: Yes    Alcohol/week: 1.0 standard drinks    Types: 1 Glasses of wine per week    Comment: Drinks alcohol once a month.  . Drug use: No     Allergies   Morphine   Review of Systems Review of Systems  Constitutional: Positive for fatigue. Negative for chills and fever.  Eyes: Negative.  Negative for visual disturbance.  Respiratory: Positive for cough. Negative for chest tightness and shortness of breath.   Gastrointestinal: Negative.  Negative for abdominal distention, abdominal pain, blood in stool, diarrhea, nausea and vomiting.  Genitourinary: Negative for flank pain and hematuria.  Musculoskeletal: Positive for neck pain. Negative for back pain.  Skin:  Negative.  Negative for color change, rash and wound.  Neurological: Negative.  Negative for dizziness, syncope, weakness, numbness and headaches.  All other systems reviewed and are negative.   Physical Exam Updated Vital Signs BP (!) 100/47   Pulse 61   Temp 97.7 F (36.5 C) (Oral)   Resp 16   Ht 4\' 10"  (1.473 m)   Wt 49.9 kg   LMP 04/05/2011   SpO2 97%   BMI 22.99 kg/m   Physical Exam  Constitutional: She is oriented to person, place, and time. She appears well-nourished. No distress.  HENT:  Head: Normocephalic and atraumatic.  Right Ear: External ear normal.  Left Ear: External ear normal.  Nose: Nose normal.  Mouth/Throat: Oropharynx is clear and moist.  Eyes: Pupils are equal, round, and reactive to light. Conjunctivae and EOM are normal.  Neck: Trachea normal and full passive range of motion without pain. Neck supple. Muscular tenderness present. No tracheal tenderness and no spinous process tenderness present. No tracheal deviation, no edema and no erythema present.    No step-off crepitus or deformity to cervical spine.  Tenderness to palpation along paraspinal muscles.  Well-healed scar present.  Full range of motion per patient. Surgical equipment felt below skin, no tenderness to palpation.  Cardiovascular: Normal rate, regular rhythm, normal heart sounds and intact distal pulses.  Pulses:      Dorsalis pedis pulses are 2+ on the right side, and 2+ on the left side.       Posterior tibial pulses are 2+ on the right side, and 2+ on the left side.  Pulmonary/Chest: Effort normal. No respiratory distress.  Abdominal: Soft. There is no tenderness. There is no rigidity, no rebound, no guarding, no tenderness at McBurney's point and negative Murphy's sign.    Musculoskeletal: She exhibits no tenderness.       Right lower leg: She exhibits no tenderness, no swelling and no deformity.       Left lower leg: She exhibits no tenderness, no swelling and no deformity.    Contractures of all 4 extremities.  Feet:  Right Foot:  Protective Sensation: 3 sites tested. 3 sites sensed.  Left Foot:  Protective Sensation: 3 sites tested. 3 sites sensed.  Neurological: She is alert and oriented to person, place, and time. No sensory deficit.  Skin: Skin is warm and dry.  Psychiatric: She has a normal mood and affect. Her behavior is normal.     ED Treatments / Results  Labs (all labs ordered are listed, but only abnormal results are displayed) Labs Reviewed  BASIC METABOLIC PANEL - Abnormal;  Notable for the following components:      Result Value   Glucose, Bld 128 (*)    Calcium 8.8 (*)    All other components within normal limits  URINALYSIS, ROUTINE W REFLEX MICROSCOPIC - Abnormal; Notable for the following components:   APPearance HAZY (*)    Protein, ur 30 (*)    Nitrite POSITIVE (*)    Leukocytes, UA LARGE (*)    Bacteria, UA RARE (*)    All other components within normal limits  URINE CULTURE  CBC WITH DIFFERENTIAL/PLATELET    EKG EKG Interpretation  Date/Time:  Sunday August 16 2018 18:29:04 EDT Ventricular Rate:  58 PR Interval:    QRS Duration: 107 QT Interval:  483 QTC Calculation: 475 R Axis:   29 Text Interpretation:  Sinus rhythm Borderline T abnormalities, diffuse leads Confirmed by Loren Racer ([id number]) on 08/16/2018 6:41:44 PM   Radiology Dg Chest 2 View  Result Date: 08/16/2018 CLINICAL DATA:  Increased fatigue and cough EXAM: CHEST - 2 VIEW COMPARISON:  07/20/2018 FINDINGS: Cardiac shadow is stable. Postsurgical changes are noted. IVC filter is seen. The overall inspiratory effort is poor. Mild right basilar atelectasis is noted. No acute bony abnormality is seen. IMPRESSION: Mild right basilar atelectasis. Electronically Signed   By: Alcide Clever M.D.   On: 08/16/2018 15:52    Procedures Procedures (including critical care time)  Medications Ordered in ED Medications  sulfamethoxazole-trimethoprim (BACTRIM  DS,SEPTRA DS) 800-160 MG per tablet 1 tablet (has no administration in time range)  fluconazole (DIFLUCAN) tablet 200 mg (has no administration in time range)  sodium chloride 0.9 % bolus 500 mL (500 mLs Intravenous New Bag/Given 08/16/18 1611)     Initial Impression / Assessment and Plan / ED Course  I have reviewed the triage vital signs and the nursing notes.  Pertinent labs & imaging results that were available during my care of the patient were reviewed by me and considered in my medical decision making (see chart for details).  Clinical Course as of Aug 16 1941  Wynelle Link Aug 16, 2018  6045 Patient cased discussed with and labs reviewed with Dr. Ranae Palms who agrees with antibiotic treatment antifungal treatment and outpatient follow-up/discharge back to facility at this time.   [BM]    Clinical Course User Index [BM] Bill Salinas, PA-C   Patient presented with increased fatigue over the last 3 days.  Patient with significant past medical history including cerebral palsy and contractures all 4 extremities.  Patient currently resides in [place] nursing facility.  Patient with suprapubic tube and history of recurrent urinary tract infections.  BMP nonacute CBC within normal limits Chest x-ray with mild right basilar atelectasis EKG reviewed by Dr. Ranae Palms Urinalysis positive for nitrates and leukocytes rare bacteria also yeast present. - sent for culture Patient is afebrile, not tachycardic, not hypotensive, well-appearing no acute distress.  Patient has been in emergency department for multiple hours resting comfortably, eating food without difficulty, full dinner tray given.  Patient is denying any and all pain at time of my evaluation.  Fluid bolus given.  Patient's urinary tract infection will be treated with Bactrim due to recent Keflex use.  Patient's most recent urine culture on 08/10/18 with multi-species present.  It is likely the patient's fatigue is caused by urinary  tract infection today.  Will treat UTI with Bactrim twice daily for 7 days, this has been sent for culture.  Patient's urinary yeast infection to be treated with Diflucan 200 for 14 days.  Patient has been urged to follow-up with her urologist for tube changing and further evaluation of her urine tract infection as soon as possible. Treatment plan has been discussed with Dr. Ranae Palms who agrees with medication choice and discharge at this time.  At this time there does not appear to be any evidence of an acute emergency medical condition and the patient appears stable for discharge with appropriate outpatient follow up. Diagnosis was discussed with patient who verbalizes understanding of care plan and is agreeable to discharge. I have discussed return precautions with patient who verbalizes understanding of return precautions. Patient strongly encouraged to follow-up with their PCP. All questions answered.  Patient's case discussed with Dr. Ranae Palms who agrees with plan to discharge with follow-up.     Note: Portions of this report may have been transcribed using voice recognition software. Every effort was made to ensure accuracy; however, inadvertent computerized transcription errors may still be present.  Final Clinical Impressions(s) / ED Diagnoses   Final diagnoses:  Urinary tract infection associated with indwelling urethral catheter, initial encounter [facility])    ED Discharge Orders         Ordered    fluconazole (DIFLUCAN) 200 MG tablet  Daily     08/16/18 1920    sulfamethoxazole-trimethoprim (BACTRIM DS,SEPTRA DS) 800-160 MG tablet  2 times daily     08/16/18 1920           Elizabeth Palau 08/17/18 0113    Rolan Bucco, MD 08/21/18 1507

## 2018-08-16 NOTE — ED Notes (Signed)
Urinary drainage bag replaced to obtain clean urine sample

## 2018-08-17 DIAGNOSIS — N390 Urinary tract infection, site not specified: Secondary | ICD-10-CM | POA: Diagnosis not present

## 2018-08-17 DIAGNOSIS — N318 Other neuromuscular dysfunction of bladder: Secondary | ICD-10-CM | POA: Diagnosis not present

## 2018-08-17 LAB — URINE CULTURE

## 2018-08-18 NOTE — Progress Notes (Deleted)
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  History supplemented by referring provider's note.  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  She has a baclofen pump and treated for chronic pain.  She has chronic neck pain.  She was found to have moderate bilateral foraminal stenosis at C4-5 and C5-6.  She subsequently underwent C4-T1 ACDF with plating and C2-T4 posterior cervical instrumented fusion in December 2018.  Headache Onset:  *** Location:  *** Quality:  *** Intensity:  ***.  *** denies new headache, thunderclap headache or severe headache that wakes *** from sleep. Aura:  *** Prodrome:  *** Postdrome:  *** Associated symptoms:  ***.  *** denies associated unilateral numbness or weakness. Duration:  *** Frequency:  *** Frequency of abortive medication: *** Triggers:  *** Exacerbating factors:  *** Relieving factors:  *** Activity:  ***  Rescue therapy:  *** Current NSAIDS:  *** Current analgesics:  Belbuca , oxycodone Current triptans:  *** Current ergotamine:  *** Current anti-emetic:  *** Current muscle relaxants:  Baclofen 20mg  QID, tizanidine 4mg  Current anti-anxiolytic:  Buspirone, clonazepam Current sleep aide:  *** Current Antihypertensive medications:  *** Current Antidepressant medications:  Prozac 80mg  daily Current Anticonvulsant medications:  Topiramate:  She is currently being trited up to 100mg  at bedtime, Lamotrigine 100mg  at bedtime Current anti-CGRP:  *** Current Vitamins/Herbal/Supplements:  *** Current  Antihistamines/Decongestants:  *** Other therapy:  *** Hormone/birth control:  *** Other medication:  Amantadine, Seroquel 150mg  at bedtime, oxybutynin, Xarelto  Past NSAIDS:  *** Past analgesics:  Fioricet Past abortive triptans:  *** Past abortive ergotamine:  *** Past muscle relaxants:  *** Past anti-emetic:  *** Past antihypertensive medications:  *** Past antidepressant medications:  *** Past anticonvulsant medications:  *** Past anti-CGRP:  *** Past vitamins/Herbal/Supplements:  *** Past antihistamines/decongestants:  *** Other past therapies:  ***  Caffeine:  *** Alcohol:  *** Smoker:  *** Diet:  *** Exercise:  *** Depression:  ***; Anxiety:  *** Other pain:  *** Sleep hygiene:  *** Family history of headache:  ***  PAST MEDICAL HISTORY: Past Medical History:  Diagnosis Date  . Abdominal pain 01/02/2012   Overview:  Overview:  Per Previous pcp note- Dr. Ta--Pt has history of endometriosis and had DUB in 2011.  She She had u/s that showed normal endometrial stripe.  She had endometrial bx that was wnl.  She has a lot of abd cramping every month.  This cramping was sometimes not relieved with hydrocodone.  She [facility] for IUD placement to help endometriosis and abd cramping.  [name]  . Abdominal pain 01/02/2012   Overview:  Overview:  Per Previous pcp note- Dr. Ta--Pt has history of endometriosis and had DUB in 2011.  She She had u/s that showed normal endometrial stripe.  She had endometrial bx that was wnl.  She has a lot of abd cramping every month.  This cramping was sometimes not relieved with hydrocodone.  [facility] for IUD placement to help endometriosis and abd cramping.  [name].  Pt still complains of abd cramping, which  I am treating with hydrocodone and ultram. Pt reports some initial relief of pain from IUD, but states it has now returned back to similar to previous cramping. Gave patient a shot of  Toradol 60 mg IM today-since positive exacerbation of abdominal cramping during past 2 weeks. Physical exam reassuring. Patient may need to [facility] for [facility] workup since she is plugged in with the GYN providers there for further treatment options regarding endometriosis and abdominal cramping. The dysfunctional uterine bleeding now well contr  . Acute cystitis with hematuria   . Acute cystitis without hematuria   . Acute respiratory failure with hypoxia ([name])   . Anemia   . Cellulitis 12/22/2014  . Cerebral palsy ([name])    Spastic Cerebral palsy, mentally intact  . Contracture, joint, multiple sites    Electric wheelchair, uses left hand to operate chair.   . Depression   . Disease of female genital organs 10/24/2010   Overview:  Overview:  Pt has history of endometriosis and had DUB in 2011.  She had u/s that showed normal endometrial stripe.  She had endometrial bx that was wnl.  She has a lot of abd cramping every month.  This cramping was sometimes not relieved with hydrocodone.  She was referred to [facility] for IUD placement to help endometriosis and abd cramping.  [name] was placed 04/30/11 by [facility].  Pt still complains of abd cramping, which was treated with hydrocodone and ultram.   Last seen at [facility] by OB/GYN 12/24/11, given Depo Lupron injection.  Ordering CT scan to r/o additional cause of abdominal pain (pt with known endometriosis and will not tolerate vaginal U/S, pain not improved s/p [name] or with Lupron injection-- only caused hot flashes and amenorrhea, no pain relief).  Also made referral to pain clinic for better pain control.  Do not think hysterectomy would help with pain, and concern for contamination of baclofen pump if did proceed with surgery.    8/5/13[name]: Pt was seen by   . [name]   . Dysarthria   . Dyspnea and respiratory abnormality 02/04/2011   Overview:  Overview:  Pt has history of recurrent PE and is on  chronic coumadin.  Pt has been complaining of dyspnea that she describes as chest tightness.  She has been to the ER multiple times for this.  Usually she gets CTA and serial CE.  She has had at least 6 CTA in a period of since 2011.  She also has an O2 requirement. It is unclear why pt feels dyspneic, complains of chest tightness, or has O2 requirement.  I thought that her complaint may be from deconditioning and referred her to PT.  Unfortunately Medicaid only pays for 4 PT visits.  Pt should be doing these exercises at home, but she has not.  I have referred her to [facility], and she has been seen by [name].  He did not understand the etiology of her dyspnea and O2 requirement.  He will continue to follow her.   Last Assessment & Plan:  Pt presents with concerns for chest tightness and dyspnea.  She was seen in ED yesterday and discharged home.  While there she had a CT chest 04/20/11 showed 1.  Multifocal degradation as detailed above.  N  . Endometriosis   . GERD (gastroesophageal reflux disease)   . Gram positive sepsis ([name]) 10/24/2014  . Gross hematuria 03/12/2013   Overview:  Overview:  status post replacement of  suprapubic tube 03/04/2013  Last Assessment & Plan:  Recent urine culture collected in ED on 5/4 did not result in significant growth. Moreover, patient has been asymptomatic with normal WBC, no fever and no true symptoms making symptomatic UTI less likely.  Will continue to monitor for any sign of infection.  Patient's xarelto had been held during her recent hospitalization due to hematuria. This has since been re-started. Monitor bleeding.    [name] Kitchen HCAP (healthcare-associated pneumonia) 10/25/2014  . HCAP (healthcare-associated pneumonia) 11/24/2017  . History of anticoagulant therapy 10/21/2012  . History of endometriosis 10/2012   OBGYN [facility]: Laparoscopic Endometrial Ablation, TVH,  . History of recurrent UTIs   . Hypertension   . HYPERTENSION, BENIGN 01/31/2010   Qualifier: Diagnosis of   By: [name], MD, [name] Incontinent of feces 03/11/2016  . Infantile cerebral palsy ([name]) 01/29/2007   Overview:  She has spastic CP.  She is mentally intact.  She is wheelchair bound.  She is wheelchair bound for ambulation      . Intracervical pessary 05/07/2011   Overview:  Overview:  Placed by [facility] 04/30/11 to treat DUB and Endometriosis.  She is seen at GYN clinic at [facility] but was considered a poor surgical candidate and referred her to [facility].  For DUB she had pelvic ultrasound that showed thin stripe and she had endometrial Bx by Dr Jennette Kettle in GYN clinic, which was negative.     . Macroscopic hematuria 03/10/2013   status post replacement of suprapubic tube 03/04/2013   . Migraine   . OCD (obsessive compulsive disorder)   . Parapneumonic effusion 10/25/2014  . Presence of intrathecal baclofen pump 03/07/2014   R LQ. Insertion T10.    [name] Kitchen Psychiatric illness 10/31/2014  . Pulmonary embolism ([name])    Lifetime Coumadin  . Pulmonary embolism ([name]) 2011, 01/2011   Will be on lifetime coumadin  . Recurrent pulmonary embolism ([name]) 05/03/2011   Pt has history of recurrent PE and is on chronic coumadin.  Pt has been complaining of dyspnea that she describes as chest tightness.  She has been to the ER multiple times for this.  Usually she gets CTA and serial CE.  She has had at least 6 CTA in a period of since 2011.  She also has an O2 requirement. It is unclear why pt feels dyspneic, complains of chest tightness, or has O2 requirement.  I thought that her complaint may be from deconditioning and referred her to PT.  Unfortunately Medicaid only pays for 4 PT visits.  Pt should be doing these exercises at home, but she has not.  [facility], and she has been seen by [name].  He did not understand the etiology of her dyspnea and O2 requirement.  He will continue to follow her.     . Seborrheic keratosis 01/18/2011  . Seizures ([name]) 02/12/2016  . Spasticity 01/05/2014  .  Transient alteration of awareness, recurrent 09/08/2006   Recurrent episodes where Rosalind  loses contact with her world and that have been studied thoroughly and appear nonepileptic in nature per Dr Terrace Arabia (neurology) The patient has had episodes starting in 2007 that initially began 1-4 times a day during which time she would have periods of unawareness followed by confusion. This continuous video EEG monitoring performed from October 8-12, 2007. Had   . Urethra dilated and patulous 07/03/2015   Patulous, Dilated Urethra. 5mL balloon on a 22-Fr catheter hoping this would  prevent the bladder spasm from pushing the balloon down the urethra (Dr Logan Bores, Urology [facility], July 2016)     PAST SURGICAL HISTORY: Past Surgical History:  Procedure Laterality Date  . ABDOMINAL HYSTERECTOMY    . APPENDECTOMY    . BACLOFEN PUMP REFILL     x 3 times  . CARPAL TUNNEL RELEASE  08/2008   Dr Teressa Senter  . CESAREAN SECTION     x 2  . CHOLECYSTECTOMY  10/14/2015   Procedure: LAPAROSCOPIC CHOLECYSTECTOMY;  Surgeon: Violeta Gelinas, MD;  Location: [facility] OR;  Service: General;;  . COLPOSCOPY  06/2000  . INTRAUTERINE DEVICE INSERTION  04/30/11   Inserted by [facility] for endometriosis  . LAPAROSCOPIC ASSISTED VAGINAL HYSTERECTOMY  10/27/2012  . MULTIPLE EXTRACTIONS WITH ALVEOLOPLASTY Bilateral 07/19/2013   Procedure: EXTRACTIONS #4, 1,61,09,60;  Surgeon: Francene Finders, DDS;  Location: [facility] OR;  Service: Oral Surgery;  Laterality: Bilateral;  . NEUROMA SURGERY Left    anterior and posterior intersosseous  . PAIN PUMP IMPLANTATION N/A 03/07/2014   Procedure: baclofen pump revision/replacement and Catheter connection replacement;  Surgeon: Cristi Loron, MD;  Location: [facility];  Service: Neurosurgery;  Laterality: N/A;  baclofen pump revision/replacement and Catheter connection replacement  . PROGRAMABLE BACLOFEN PUMP REVISION  03/17/14   Battery Replacement   . TUBAL LIGATION  2003  . URETHRA SURGERY    .  WRIST SURGERY  06/2010   Dr Dierdre Searles, hand surgeon, [name]    MEDICATIONS: Current Outpatient Medications on File Prior to Visit  Medication Sig Dispense Refill  . acetaminophen (TYLENOL 8 HOUR) 650 MG CR tablet Take 1 tablet (650 mg total) by mouth every 8 (eight) hours as needed. (Patient taking differently: Take 650 mg by mouth every 8 (eight) hours as needed for pain. ) 30 tablet 0  . amantadine (SYMMETREL) 100 MG capsule Take 100 mg by mouth daily.     . baclofen (LIORESAL) 20 MG tablet Take 20 mg by mouth 4 (four) times daily. For bladder spasms    . bisacodyl (DULCOLAX) 10 MG suppository Place 10 mg rectally 2 (two) times daily as needed for moderate constipation.     . Buprenorphine HCl (BELBUCA) 450 MCG FILM Place 450 mg inside cheek every 12 (twelve) hours.    . busPIRone (BUSPAR) 5 MG tablet Take 5 mg by mouth 3 (three) times daily.    . butalbital-acetaminophen-caffeine (FIORICET, ESGIC) 50-325-40 MG tablet Take 2 tablets by mouth every 4 (four) hours as needed for headache (not controlled by Tylenol). 14 tablet 0  . clonazePAM (KLONOPIN) 0.5 MG tablet Take 1.5 tablets (0.75 mg total) by mouth 3 (three) times daily. 15 tablet 0  . feeding supplement, ENSURE ENLIVE, (ENSURE ENLIVE) LIQD Take 237 mLs by mouth 2 (two) times daily between meals. 237 mL 0  . ferrous sulfate 325 (65 FE) MG tablet Take 325 mg by mouth daily.     . fluconazole (DIFLUCAN) 200 MG tablet Take 1 tablet (200 mg total) by mouth daily for 14 days. 14 tablet 0  . FLUoxetine (PROZAC) 40 MG capsule Take 80 mg by mouth daily.    [name] Kitchen lactulose (CHRONULAC) 10 GM/15ML solution Take 20 g by mouth daily as needed for mild constipation or moderate constipation.    [name] Kitchen lamoTRIgine (LAMICTAL) 100 MG tablet Take 100 mg by mouth at bedtime.    [name] Kitchen linaclotide (LINZESS) 145 MCG CAPS capsule Take 145 mcg by mouth daily before breakfast.    . lubiprostone (AMITIZA) 24 MCG capsule  Take 1 capsule (24 mcg total) by mouth 2 (two) times daily with  a meal. (Patient not taking: Reported on 07/07/2018) 60 capsule 11  . Melatonin 5 MG TABS Take 5 mg by mouth at bedtime.     . Multiple Vitamins-Minerals (CERTA-VITE PO) Take 1 tablet by mouth daily.    [name] Kitchen oxybutynin (DITROPAN) 5 MG tablet Take 5 mg by mouth 4 (four) times daily.     [name] Kitchen oxyCODONE (ROXICODONE) 5 MG immediate release tablet Take 5 mg by mouth every 4 (four) hours as needed for moderate pain or severe pain.    . polyethylene glycol powder (GLYCOLAX/MIRALAX) powder Take 1  1/2 dose  daily in 12 ounces of fluid every day. (Patient taking differently: Take 17 g by mouth 2 (two) times daily. ) 255 g 3  . QUEtiapine (SEROQUEL) 50 MG tablet Take 150 mg by mouth at bedtime.    . rivaroxaban (XARELTO) 10 MG TABS tablet Take 10 mg by mouth daily.    [name] Kitchen sulfamethoxazole-trimethoprim (BACTRIM DS,SEPTRA DS) 800-160 MG tablet Take 1 tablet by mouth 2 (two) times daily for 7 days. 14 tablet 0  . tiZANidine (ZANAFLEX) 4 MG capsule Take 4 mg by mouth 2 (two) times daily.     [name] Kitchen topiramate (TOPAMAX) 25 MG tablet Take 3 tablets (75mg ) at bedtime for 2 weeks, then take 4 tablets (100mg ) at bedtime (Patient taking differently: Take 75-100 mg by mouth See admin instructions. Take 75 mg by mouth at bedtime for 2 weeks until 08/21/18. Then start 100 mg by mouth at bedtime on 08/22/18) 120 tablet 5   No current facility-administered medications on file prior to visit.     ALLERGIES: Allergies  Allergen Reactions  . Morphine Dermatitis and Other (See Comments)    Skin turned red    FAMILY HISTORY: Family History  Problem Relation Age of Onset  . Asthma Father   . Colon cancer Maternal Grandmother        Died in her 108's  . Breast cancer Paternal Grandmother        Died in her 31's  . Heart attack Maternal Grandfather        Died in his 96's  . Alzheimer's disease Paternal Grandfather        Died in his 65's  . Breast cancer Mother   . Stomach cancer Neg Hx     SOCIAL HISTORY: Social History    Socioeconomic History  . Marital status: Divorced    Spouse name: Not on file  . Number of children: 2  . Years of education: college  . Highest education level: Not on file  Occupational History    Employer: UNEMPLOYED  Social Needs  . Financial resource strain: Not on file  . Food insecurity:    Worry: Not on file    Inability: Not on file  . Transportation needs:    Medical: Not on file    Non-medical: Not on file  Tobacco Use  . Smoking status: Never Smoker  . Smokeless tobacco: Never Used  Substance and Sexual Activity  . Alcohol use: Yes    Alcohol/week: 1.0 standard drinks    Types: 1 Glasses of wine per week    Comment: Drinks alcohol once a month.  . Drug use: No  . Sexual activity: Never  Lifestyle  . Physical activity:    Days per week: Not on file    Minutes per session: Not on file  . Stress: Not on file  Relationships  .  Social connections:    Talks on phone: Not on file    Gets together: Not on file    Attends religious service: Not on file    Active member of club or organization: Not on file    Attends meetings of clubs or organizations: Not on file    Relationship status: Not on file  . Intimate partner violence:    Fear of current or ex partner: Not on file    Emotionally abused: Not on file    Physically abused: Not on file    Forced sexual activity: Not on file  Other Topics Concern  . 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    She enjoys shopping.   Lives in a nursing facility,[facility] since 02/11/16      Ailene Ards, who has schizophrenia, MR, is father of daughter.  Homero Fellers and pt are no longer together.  Homero Fellers does not see [name]. Daughter is 60 yo, Levy Sjogren, lives in a group home in [place].    [name]'s mother reportedly has breast cancer.    Needs assistance with ADL's and IADL's--has personal care services 20 hrs/wk;        No tobacco, EtOH, drugs.      **Case Manager: Jack Quarto [phone]      Patient lives [facility]    REVIEW OF SYSTEMS: Constitutional: No fevers, chills, or sweats, no generalized fatigue, change in appetite Eyes: No visual changes, double vision, eye pain Ear, nose and throat: No hearing loss, ear pain, nasal congestion, sore throat Cardiovascular: No chest pain, palpitations Respiratory:  No shortness of breath at rest or with exertion, wheezes GastrointestinaI: No nausea, vomiting, diarrhea, abdominal pain, fecal incontinence Genitourinary:  No dysuria, urinary retention or frequency Musculoskeletal:  No neck pain, back pain Integumentary: No rash, pruritus, skin lesions Neurological: as above Psychiatric: No depression, insomnia, anxiety Endocrine: No palpitations, fatigue, diaphoresis, mood swings, change in appetite, change in weight, increased thirst Hematologic/Lymphatic:  No purpura, petechiae. Allergic/Immunologic: no itchy/runny eyes, nasal congestion, recent allergic reactions, rashes  PHYSICAL EXAM: *** General: No acute distress.  Patient appears ***-groomed.  *** Head:  Normocephalic/atraumatic Eyes:  fundi examined but not visualized Neck: supple, no paraspinal tenderness, full range of motion Back: No paraspinal tenderness Heart: regular rate and rhythm Lungs: Clear to auscultation bilaterally. Vascular: No carotid bruits. Neurological Exam: Mental status: alert and oriented to person, place, and time, recent and remote memory intact, fund of knowledge intact, attention and concentration intact, speech fluent and not dysarthric, language intact. Cranial nerves: CN I: not tested CN II: pupils equal, round and reactive to light, visual fields intact CN III, IV, VI:  full range of motion, no nystagmus, no ptosis CN V: facial sensation intact CN VII: *** facial weakness CN VIII: hearing intact CN IX, X: gag intact, uvula midline CN XI:  sternocleidomastoid and trapezius muscles intact CN XII: tongue midline Bulk & Tone: spastic Motor:  Quadriplegic.  Poor neck flexion/extension.  Right arm flexed at elbow, ***.   Sensation:  Pinprick *** temperature *** and vibration sensation intact.  ***. Deep Tendon Reflexes:  *** throughout,  *** toes downgoing.  *** Finger to nose testing:  Difficult to fully assess Heel to shin:  Unable to assess Gait:  ***  IMPRESSION: ***  PLAN: ***  Thank you for allowing me to take part in the care of this patient.  Shon Millet,  DO  CC:  Wandra Mannan, NP

## 2018-08-19 ENCOUNTER — Emergency Department (HOSPITAL_COMMUNITY): Payer: Medicare Other

## 2018-08-19 ENCOUNTER — Encounter (HOSPITAL_COMMUNITY): Payer: Self-pay | Admitting: Emergency Medicine

## 2018-08-19 ENCOUNTER — Ambulatory Visit: Payer: Medicare Other | Admitting: Neurology

## 2018-08-19 ENCOUNTER — Other Ambulatory Visit: Payer: Self-pay

## 2018-08-19 ENCOUNTER — Encounter: Payer: Self-pay | Admitting: Neurology

## 2018-08-19 ENCOUNTER — Inpatient Hospital Stay (HOSPITAL_COMMUNITY)
Admission: EM | Admit: 2018-08-19 | Discharge: 2018-08-22 | DRG: 698 | Disposition: A | Discharge status: Skilled Nursing Facility | Payer: Medicare Other | Attending: Internal Medicine | Admitting: Internal Medicine

## 2018-08-19 ENCOUNTER — Telehealth: Payer: Self-pay | Admitting: Neurology

## 2018-08-19 DIAGNOSIS — F23 Brief psychotic disorder: Secondary | ICD-10-CM | POA: Diagnosis not present

## 2018-08-19 DIAGNOSIS — G800 Spastic quadriplegic cerebral palsy: Secondary | ICD-10-CM | POA: Diagnosis present

## 2018-08-19 DIAGNOSIS — Z029 Encounter for administrative examinations, unspecified: Secondary | ICD-10-CM

## 2018-08-19 DIAGNOSIS — I2699 Other pulmonary embolism without acute cor pulmonale: Secondary | ICD-10-CM | POA: Diagnosis present

## 2018-08-19 DIAGNOSIS — Y846 Urinary catheterization as the cause of abnormal reaction of the patient, or of later complication, without mention of misadventure at the time of the procedure: Secondary | ICD-10-CM | POA: Diagnosis present

## 2018-08-19 DIAGNOSIS — Z9359 Other cystostomy status: Secondary | ICD-10-CM

## 2018-08-19 DIAGNOSIS — T83518A Infection and inflammatory reaction due to other urinary catheter, initial encounter: Principal | ICD-10-CM | POA: Diagnosis present

## 2018-08-19 DIAGNOSIS — K219 Gastro-esophageal reflux disease without esophagitis: Secondary | ICD-10-CM | POA: Diagnosis present

## 2018-08-19 DIAGNOSIS — R498 Other voice and resonance disorders: Secondary | ICD-10-CM | POA: Diagnosis not present

## 2018-08-19 DIAGNOSIS — R911 Solitary pulmonary nodule: Secondary | ICD-10-CM | POA: Diagnosis not present

## 2018-08-19 DIAGNOSIS — Z79899 Other long term (current) drug therapy: Secondary | ICD-10-CM | POA: Diagnosis not present

## 2018-08-19 DIAGNOSIS — Z86711 Personal history of pulmonary embolism: Secondary | ICD-10-CM

## 2018-08-19 DIAGNOSIS — F331 Major depressive disorder, recurrent, moderate: Secondary | ICD-10-CM | POA: Diagnosis not present

## 2018-08-19 DIAGNOSIS — G801 Spastic diplegic cerebral palsy: Secondary | ICD-10-CM | POA: Diagnosis present

## 2018-08-19 DIAGNOSIS — F419 Anxiety disorder, unspecified: Secondary | ICD-10-CM | POA: Diagnosis present

## 2018-08-19 DIAGNOSIS — K5909 Other constipation: Secondary | ICD-10-CM | POA: Diagnosis present

## 2018-08-19 DIAGNOSIS — R509 Fever, unspecified: Secondary | ICD-10-CM | POA: Diagnosis not present

## 2018-08-19 DIAGNOSIS — Z7401 Bed confinement status: Secondary | ICD-10-CM | POA: Diagnosis not present

## 2018-08-19 DIAGNOSIS — R569 Unspecified convulsions: Secondary | ICD-10-CM

## 2018-08-19 DIAGNOSIS — E872 Acidosis: Secondary | ICD-10-CM | POA: Diagnosis present

## 2018-08-19 DIAGNOSIS — G809 Cerebral palsy, unspecified: Secondary | ICD-10-CM | POA: Diagnosis not present

## 2018-08-19 DIAGNOSIS — Z885 Allergy status to narcotic agent status: Secondary | ICD-10-CM | POA: Diagnosis not present

## 2018-08-19 DIAGNOSIS — M4322 Fusion of spine, cervical region: Secondary | ICD-10-CM | POA: Diagnosis not present

## 2018-08-19 DIAGNOSIS — H53143 Visual discomfort, bilateral: Secondary | ICD-10-CM | POA: Diagnosis not present

## 2018-08-19 DIAGNOSIS — R4182 Altered mental status, unspecified: Secondary | ICD-10-CM | POA: Diagnosis not present

## 2018-08-19 DIAGNOSIS — A419 Sepsis, unspecified organism: Secondary | ICD-10-CM | POA: Diagnosis present

## 2018-08-19 DIAGNOSIS — F418 Other specified anxiety disorders: Secondary | ICD-10-CM | POA: Diagnosis not present

## 2018-08-19 DIAGNOSIS — Z7901 Long term (current) use of anticoagulants: Secondary | ICD-10-CM

## 2018-08-19 DIAGNOSIS — Z993 Dependence on wheelchair: Secondary | ICD-10-CM

## 2018-08-19 DIAGNOSIS — R079 Chest pain, unspecified: Secondary | ICD-10-CM | POA: Diagnosis not present

## 2018-08-19 DIAGNOSIS — M419 Scoliosis, unspecified: Secondary | ICD-10-CM | POA: Diagnosis not present

## 2018-08-19 DIAGNOSIS — G894 Chronic pain syndrome: Secondary | ICD-10-CM | POA: Diagnosis present

## 2018-08-19 DIAGNOSIS — Z87440 Personal history of urinary (tract) infections: Secondary | ICD-10-CM

## 2018-08-19 DIAGNOSIS — I959 Hypotension, unspecified: Secondary | ICD-10-CM | POA: Diagnosis not present

## 2018-08-19 DIAGNOSIS — N319 Neuromuscular dysfunction of bladder, unspecified: Secondary | ICD-10-CM | POA: Diagnosis present

## 2018-08-19 DIAGNOSIS — M6240 Contracture of muscle, unspecified site: Secondary | ICD-10-CM | POA: Diagnosis not present

## 2018-08-19 DIAGNOSIS — M2450 Contracture, unspecified joint: Secondary | ICD-10-CM | POA: Diagnosis present

## 2018-08-19 DIAGNOSIS — R000 Tachycardia, unspecified: Secondary | ICD-10-CM | POA: Diagnosis not present

## 2018-08-19 DIAGNOSIS — G4089 Other seizures: Secondary | ICD-10-CM | POA: Diagnosis not present

## 2018-08-19 DIAGNOSIS — M2550 Pain in unspecified joint: Secondary | ICD-10-CM | POA: Diagnosis not present

## 2018-08-19 DIAGNOSIS — R4781 Slurred speech: Secondary | ICD-10-CM | POA: Diagnosis not present

## 2018-08-19 DIAGNOSIS — G8929 Other chronic pain: Secondary | ICD-10-CM | POA: Diagnosis not present

## 2018-08-19 DIAGNOSIS — I10 Essential (primary) hypertension: Secondary | ICD-10-CM | POA: Diagnosis present

## 2018-08-19 DIAGNOSIS — F339 Major depressive disorder, recurrent, unspecified: Secondary | ICD-10-CM | POA: Diagnosis present

## 2018-08-19 DIAGNOSIS — F330 Major depressive disorder, recurrent, mild: Secondary | ICD-10-CM | POA: Diagnosis not present

## 2018-08-19 DIAGNOSIS — R0902 Hypoxemia: Secondary | ICD-10-CM | POA: Diagnosis not present

## 2018-08-19 DIAGNOSIS — R51 Headache: Secondary | ICD-10-CM | POA: Diagnosis not present

## 2018-08-19 DIAGNOSIS — G2581 Restless legs syndrome: Secondary | ICD-10-CM | POA: Diagnosis not present

## 2018-08-19 DIAGNOSIS — N390 Urinary tract infection, site not specified: Secondary | ICD-10-CM | POA: Diagnosis present

## 2018-08-19 DIAGNOSIS — R05 Cough: Secondary | ICD-10-CM | POA: Diagnosis not present

## 2018-08-19 DIAGNOSIS — G4489 Other headache syndrome: Secondary | ICD-10-CM | POA: Diagnosis not present

## 2018-08-19 DIAGNOSIS — J9811 Atelectasis: Secondary | ICD-10-CM | POA: Diagnosis not present

## 2018-08-19 DIAGNOSIS — H539 Unspecified visual disturbance: Secondary | ICD-10-CM | POA: Diagnosis not present

## 2018-08-19 DIAGNOSIS — R0789 Other chest pain: Secondary | ICD-10-CM | POA: Diagnosis not present

## 2018-08-19 DIAGNOSIS — N289 Disorder of kidney and ureter, unspecified: Secondary | ICD-10-CM | POA: Diagnosis not present

## 2018-08-19 DIAGNOSIS — N3000 Acute cystitis without hematuria: Secondary | ICD-10-CM | POA: Diagnosis not present

## 2018-08-19 DIAGNOSIS — K5900 Constipation, unspecified: Secondary | ICD-10-CM | POA: Diagnosis not present

## 2018-08-19 DIAGNOSIS — M542 Cervicalgia: Secondary | ICD-10-CM | POA: Diagnosis not present

## 2018-08-19 DIAGNOSIS — H04123 Dry eye syndrome of bilateral lacrimal glands: Secondary | ICD-10-CM | POA: Diagnosis not present

## 2018-08-19 DIAGNOSIS — G8250 Quadriplegia, unspecified: Secondary | ICD-10-CM | POA: Diagnosis not present

## 2018-08-19 LAB — I-STAT CG4 LACTIC ACID, ED
LACTIC ACID, VENOUS: 3.91 mmol/L — AB (ref 0.5–1.9)
Lactic Acid, Venous: 1.42 mmol/L (ref 0.5–1.9)

## 2018-08-19 LAB — URINALYSIS, ROUTINE W REFLEX MICROSCOPIC
Bilirubin Urine: NEGATIVE
GLUCOSE, UA: NEGATIVE mg/dL
HGB URINE DIPSTICK: NEGATIVE
KETONES UR: NEGATIVE mg/dL
NITRITE: POSITIVE — AB
PROTEIN: 30 mg/dL — AB
Specific Gravity, Urine: 1.018 (ref 1.005–1.030)
pH: 6 (ref 5.0–8.0)

## 2018-08-19 LAB — CBC
HEMATOCRIT: 39.9 % (ref 36.0–46.0)
Hemoglobin: 13.2 g/dL (ref 12.0–15.0)
MCH: 30.7 pg (ref 26.0–34.0)
MCHC: 33.1 g/dL (ref 30.0–36.0)
MCV: 92.8 fL (ref 78.0–100.0)
Platelets: 223 10*3/uL (ref 150–400)
RBC: 4.3 MIL/uL (ref 3.87–5.11)
RDW: 13.9 % (ref 11.5–15.5)
WBC: 6.1 10*3/uL (ref 4.0–10.5)

## 2018-08-19 LAB — PROTIME-INR
INR: 1.06
Prothrombin Time: 13.7 seconds (ref 11.4–15.2)

## 2018-08-19 LAB — BASIC METABOLIC PANEL
Anion gap: 11 (ref 5–15)
BUN: 20 mg/dL (ref 6–20)
CALCIUM: 10.1 mg/dL (ref 8.9–10.3)
CO2: 26 mmol/L (ref 22–32)
Chloride: 108 mmol/L (ref 98–111)
Creatinine, Ser: 0.82 mg/dL (ref 0.44–1.00)
GFR calc Af Amer: >60 mL/min (ref >60)
GLUCOSE: 110 mg/dL — AB (ref 70–99)
Potassium: 4.5 mmol/L (ref 3.5–5.1)
Sodium: 145 mmol/L (ref 135–145)

## 2018-08-19 MED ORDER — OXYCODONE HCL 5 MG PO TABS
5.0000 mg | ORAL_TABLET | ORAL | Status: DC | PRN
  Administered 2018-08-19 – 2018-08-20 (×3): 5 mg via ORAL
  Filled 2018-08-19 (×3): qty 1

## 2018-08-19 MED ORDER — CLONAZEPAM 0.5 MG PO TABS
0.7500 mg | ORAL_TABLET | Freq: Three times a day (TID) | ORAL | Status: DC
  Administered 2018-08-19 – 2018-08-22 (×9): 0.75 mg via ORAL
  Filled 2018-08-19 (×9): qty 2

## 2018-08-19 MED ORDER — SODIUM CHLORIDE 0.9 % IV SOLN
1.0000 g | Freq: Once | INTRAVENOUS | Status: AC
  Administered 2018-08-19: 1 g via INTRAVENOUS
  Filled 2018-08-19: qty 1

## 2018-08-19 MED ORDER — ACETAMINOPHEN 325 MG PO TABS
650.0000 mg | ORAL_TABLET | Freq: Three times a day (TID) | ORAL | Status: DC | PRN
  Administered 2018-08-21: 650 mg via ORAL
  Filled 2018-08-19 (×2): qty 2

## 2018-08-19 MED ORDER — IOPAMIDOL (ISOVUE-300) INJECTION 61%
INTRAVENOUS | Status: AC
  Filled 2018-08-19: qty 100

## 2018-08-19 MED ORDER — TOPIRAMATE 100 MG PO TABS: 100.0000 mg | ORAL_TABLET | Freq: Every day | ORAL | Status: DC

## 2018-08-19 MED ORDER — POLYETHYLENE GLYCOL 3350 17 G PO PACK
17.0000 g | PACK | Freq: Two times a day (BID) | ORAL | Status: DC
  Administered 2018-08-19 – 2018-08-21 (×4): 17 g via ORAL
  Filled 2018-08-19 (×4): qty 1

## 2018-08-19 MED ORDER — POLYETHYLENE GLYCOL 3350 17 GM/SCOOP PO POWD: 17.0000 g | Freq: Two times a day (BID) | ORAL | Status: DC

## 2018-08-19 MED ORDER — RIVAROXABAN 10 MG PO TABS
10.0000 mg | ORAL_TABLET | Freq: Every day | ORAL | Status: DC
  Administered 2018-08-20 – 2018-08-22 (×3): 10 mg via ORAL
  Filled 2018-08-19 (×3): qty 1

## 2018-08-19 MED ORDER — VANCOMYCIN HCL IN DEXTROSE 1-5 GM/200ML-% IV SOLN
1000.0000 mg | Freq: Once | INTRAVENOUS | Status: AC
  Administered 2018-08-19: 1000 mg via INTRAVENOUS
  Filled 2018-08-19: qty 200

## 2018-08-19 MED ORDER — AMANTADINE HCL 100 MG PO CAPS
100.0000 mg | ORAL_CAPSULE | Freq: Every day | ORAL | Status: DC
  Administered 2018-08-20 – 2018-08-22 (×3): 100 mg via ORAL
  Filled 2018-08-19 (×3): qty 1

## 2018-08-19 MED ORDER — TOPIRAMATE 25 MG PO TABS
75.0000 mg | ORAL_TABLET | Freq: Every day | ORAL | Status: AC
  Administered 2018-08-19 – 2018-08-21 (×3): 75 mg via ORAL
  Filled 2018-08-19 (×3): qty 3

## 2018-08-19 MED ORDER — BISACODYL 10 MG RE SUPP: 10.0000 mg | Freq: Two times a day (BID) | RECTAL | Status: DC | PRN

## 2018-08-19 MED ORDER — MELATONIN 5 MG PO TABS
5.0000 mg | ORAL_TABLET | Freq: Every day | ORAL | Status: DC
  Administered 2018-08-19 – 2018-08-21 (×3): 5 mg via ORAL
  Filled 2018-08-19 (×3): qty 1

## 2018-08-19 MED ORDER — FLUOXETINE HCL 20 MG PO CAPS
80.0000 mg | ORAL_CAPSULE | Freq: Every day | ORAL | Status: DC
  Administered 2018-08-20 – 2018-08-22 (×3): 80 mg via ORAL
  Filled 2018-08-19 (×3): qty 4

## 2018-08-19 MED ORDER — PIPERACILLIN-TAZOBACTAM 3.375 G IVPB 30 MIN
3.3750 g | Freq: Once | INTRAVENOUS | Status: AC
  Administered 2018-08-19: 3.375 g via INTRAVENOUS
  Filled 2018-08-19: qty 50

## 2018-08-19 MED ORDER — FERROUS SULFATE 325 (65 FE) MG PO TABS
325.0000 mg | ORAL_TABLET | Freq: Every day | ORAL | Status: DC
  Administered 2018-08-20 – 2018-08-22 (×3): 325 mg via ORAL
  Filled 2018-08-19 (×3): qty 1

## 2018-08-19 MED ORDER — BUSPIRONE HCL 5 MG PO TABS
5.0000 mg | ORAL_TABLET | Freq: Three times a day (TID) | ORAL | Status: DC
  Administered 2018-08-19 – 2018-08-22 (×9): 5 mg via ORAL
  Filled 2018-08-19 (×9): qty 1

## 2018-08-19 MED ORDER — LINACLOTIDE 145 MCG PO CAPS
145.0000 ug | ORAL_CAPSULE | Freq: Every day | ORAL | Status: DC
  Administered 2018-08-20 – 2018-08-22 (×3): 145 ug via ORAL
  Filled 2018-08-19 (×3): qty 1

## 2018-08-19 MED ORDER — ENSURE ENLIVE PO LIQD
237.0000 mL | Freq: Two times a day (BID) | ORAL | Status: DC
  Administered 2018-08-20 – 2018-08-21 (×4): 237 mL via ORAL

## 2018-08-19 MED ORDER — LACTULOSE 10 GM/15ML PO SOLN: 20.0000 g | Freq: Every day | ORAL | Status: DC | PRN

## 2018-08-19 MED ORDER — LAMOTRIGINE 100 MG PO TABS
100.0000 mg | ORAL_TABLET | Freq: Every day | ORAL | Status: DC
  Administered 2018-08-19 – 2018-08-21 (×3): 100 mg via ORAL
  Filled 2018-08-19 (×3): qty 1

## 2018-08-19 MED ORDER — BACLOFEN 20 MG PO TABS
20.0000 mg | ORAL_TABLET | Freq: Four times a day (QID) | ORAL | Status: DC
  Administered 2018-08-19 – 2018-08-22 (×11): 20 mg via ORAL
  Filled 2018-08-19 (×11): qty 1

## 2018-08-19 MED ORDER — TIZANIDINE HCL 4 MG PO TABS
4.0000 mg | ORAL_TABLET | Freq: Two times a day (BID) | ORAL | Status: DC
  Administered 2018-08-19 – 2018-08-22 (×6): 4 mg via ORAL
  Filled 2018-08-19 (×6): qty 1

## 2018-08-19 MED ORDER — IOPAMIDOL (ISOVUE-300) INJECTION 61%
100.0000 mL | Freq: Once | INTRAVENOUS | Status: AC | PRN
  Administered 2018-08-19: 100 mL via INTRAVENOUS

## 2018-08-19 MED ORDER — QUETIAPINE FUMARATE 25 MG PO TABS
150.0000 mg | ORAL_TABLET | Freq: Every day | ORAL | Status: DC
  Administered 2018-08-19 – 2018-08-21 (×3): 150 mg via ORAL
  Filled 2018-08-19 (×3): qty 2

## 2018-08-19 MED ORDER — ADULT MULTIVITAMIN LIQUID CH
15.0000 mL | Freq: Every day | ORAL | Status: DC
  Administered 2018-08-20 – 2018-08-22 (×3): 15 mL via ORAL
  Filled 2018-08-19 (×3): qty 15

## 2018-08-19 MED ORDER — BUTALBITAL-APAP-CAFFEINE 50-325-40 MG PO TABS
2.0000 | ORAL_TABLET | ORAL | Status: DC | PRN
  Administered 2018-08-19 – 2018-08-22 (×4): 2 via ORAL
  Filled 2018-08-19 (×4): qty 2

## 2018-08-19 MED ORDER — SODIUM CHLORIDE 0.9 % IV SOLN
INTRAVENOUS | Status: AC
  Administered 2018-08-19 – 2018-08-20 (×2): via INTRAVENOUS

## 2018-08-19 MED ORDER — OXYBUTYNIN CHLORIDE 5 MG PO TABS
5.0000 mg | ORAL_TABLET | Freq: Four times a day (QID) | ORAL | Status: DC
  Administered 2018-08-19 – 2018-08-22 (×11): 5 mg via ORAL
  Filled 2018-08-19 (×11): qty 1

## 2018-08-19 MED ORDER — SODIUM CHLORIDE 0.9 % IV SOLN
1.0000 g | Freq: Three times a day (TID) | INTRAVENOUS | Status: DC
  Administered 2018-08-19 – 2018-08-22 (×9): 1 g via INTRAVENOUS
  Filled 2018-08-19 (×10): qty 1

## 2018-08-19 MED ORDER — IOPAMIDOL (ISOVUE-300) INJECTION 61%: 100.0000 mL | Freq: Once | INTRAVENOUS | Status: DC | PRN

## 2018-08-19 NOTE — Progress Notes (Signed)
Pharmacy Antibiotic Note  [name] SlickerJudith [name] is a 42 y.o. female admitted on 08/19/2018 with UTI - history of ESBL UTI per H&P.  Pharmacy has been consulted for meropenem dosing.  Patient has a PMH significant for cerebral palsy with suprapubic catheter and a history of recurrent UTIs. Patient seen in ED several days ago and prescribed SMX/TMP for UTI - presenting to the ED today with complaints of abdominal pain, fever/sweats.   Today, 08/19/18  WBC 6.1  Lactate 3.9 --> 1.4  Tmax 100 F  Plan:  Meropenem 1 g IV q8h  Follow culture results for ability to potentially de-escalate antibiotics     Temp (24hrs), Avg:99.4 F (37.4 C), Min:98.8 F (37.1 C), Max:100 F (37.8 C)  Recent Labs  Lab 08/16/18 1509 08/19/18 1256 08/19/18 1318 08/19/18 1607  WBC 5.4 6.1  --   --   CREATININE 0.63 0.82  --   --   LATICACIDVEN  --   --  3.91* 1.42    Estimated Creatinine Clearance: 61.5 mL/min (by C-G formula based on SCr of 0.82 mg/dL).    Allergies  Allergen Reactions  . Morphine Dermatitis and Other (See Comments)    Skin turned red   Antimicrobials this admission: meropenem 9/18 >>   Dose adjustments this admission:  Microbiology results: 9/18 BCx: Sent 9/18 UCx: Sent   Thank you for allowing pharmacy to be a part of this patient's care.  [name] CarbonMary M Marbella [name], PharmD, BCPS Clinical Pharmacist 08/19/2018 5:42 PM

## 2018-08-19 NOTE — ED Provider Notes (Signed)
[facility]-EMERGENCY DEPT Provider Note   CSN: [id number] Arrival date & time: 08/19/18  1221     History   Chief Complaint Chief Complaint  Patient presents with  . Urinary Tract Infection  . Fever    HPI [name] [name] is a 42 y.o. female.  42 year old female with extensive past medical history including cerebral palsy, suprapubic catheter, HTN, PE on anticoagulation who p/w Fever and not feeling well.  The patient presented here on 9/15 with increased fatigue and muscle cramping from her chronic contractures.  UA was concerning for infection and she was sent home on Bactrim.  Today her caregivers found that she was sweaty and running a fever.  They felt like she was not acting herself.  She states that she has felt tired and she notes that her muscle cramps have continued.  She reports some abdominal pain that is worse when I palpate her abdomen.  No cough or breathing problems.  LEVEL 5 CAVEAT DUE TO LANGUAGE BARRIER  The history is provided by the patient.  Urinary Tract Infection    Fever      Past Medical History:  Diagnosis Date  . Abdominal pain 01/02/2012   Overview:  Overview:  Per Previous pcp note- Dr. Ta--Pt has history of endometriosis and had DUB in 2011.  She She had u/s that showed normal endometrial stripe.  She had endometrial bx that was wnl.  She has a lot of abd cramping every month.  This cramping was sometimes not relieved with hydrocodone.  She was referred to [facility] for IUD placement to help endometriosis and abd cramping.  [name]  . Abdominal pain 01/02/2012   Overview:  Overview:  Per Previous pcp note- Dr. Ta--Pt has history of endometriosis and had DUB in 2011.  She She had u/s that showed normal endometrial stripe.  She had endometrial bx that was wnl.  She has a lot of abd cramping every month.  This cramping was sometimes not relieved with hydrocodone.  She was referred to [facility] for IUD placement to help endometriosis  and abd cramping.  [name] was placed 04/30/11 by [facility].  Pt still complains of abd cramping, which I am treating with hydrocodone and ultram. Pt reports some initial relief of pain from IUD, but states it has now returned back to similar to previous cramping. Gave patient a shot of Toradol 60 mg IM today-since positive exacerbation of abdominal cramping during past 2 weeks. Physical exam reassuring. Patient may need to return to [facility] for further workup since she is plugged in with the GYN providers there for further treatment options regarding endometriosis and abdominal cramping. The dysfunctional uterine bleeding now well contr  . Acute cystitis with hematuria   . Acute cystitis without hematuria   . Acute respiratory failure with hypoxia ([name])   . Anemia   . Cellulitis 12/22/2014  . Cerebral palsy ([name])    Spastic Cerebral palsy, mentally intact  . Contracture, joint, multiple sites    Electric wheelchair, uses left hand to operate chair.   . Depression   . Disease of female genital organs 10/24/2010   Overview:  Overview:  Pt has history of endometriosis and had DUB in 2011.  She had u/s that showed normal endometrial stripe.  She had endometrial bx that was wnl.  She has a lot of abd cramping every month.  This cramping was sometimes not relieved with hydrocodone.  She was referred to [facility] for IUD placement to  help endometriosis and abd cramping.  [name].  Pt still complains of abd cramping, which was treated with hydrocodone and ultram.   [facility] 12/24/11, given Depo Lupron injection.  Ordering CT scan to r/o additional cause of abdominal pain (pt with known endometriosis and will not tolerate vaginal U/S, pain not improved s/p [name] or with Lupron injection-- only caused hot flashes and amenorrhea, no pain relief).  Also made referral to pain clinic for better pain control.  Do not think hysterectomy would help with pain, and  concern for contamination of baclofen pump if did proceed with surgery.    8/5/13[name]: [facility]: Pt was seen by   . [name]   . Dysarthria   . Dyspnea and respiratory abnormality 02/04/2011   Overview:  Overview:  Pt has history of recurrent PE and is on chronic coumadin.  Pt has been complaining of dyspnea that she describes as chest tightness.  She has been to the ER multiple times for this.  Usually she gets CTA and serial CE.  She has had at least 6 CTA in a period of since 2011.  She also has an O2 requirement. It is unclear why pt feels dyspneic, complains of chest tightness, or has O2 requirement.  I thought that her complaint may be from deconditioning and referred her to PT.  Unfortunately Medicaid only pays for 4 PT visits.  Pt should be doing these exercises at home, but she has not.  [facility], and she has been seen by Dr Marchelle Gearingamaswamy.  He did not understand the etiology of her dyspnea and O2 requirement.  He will continue to follow her.   Last Assessment & Plan:  Pt presents with concerns for chest tightness and dyspnea.  She was seen in ED yesterday and discharged home.  While there she had a CT chest 04/20/11 showed 1.  Multifocal degradation as detailed above.  N  . Endometriosis   . GERD (gastroesophageal reflux disease)   . Gram positive sepsis ([name]) 10/24/2014  . Gross hematuria 03/12/2013   Overview:  Overview:  status post replacement of suprapubic tube 03/04/2013  Last Assessment & Plan:  Recent urine culture collected in ED on 5/4 did not result in significant growth. Moreover, patient has been asymptomatic with normal WBC, no fever and no true symptoms making symptomatic UTI less likely.  Will continue to monitor for any sign of infection.  Patient's xarelto had been held during her recent hospitalization due to hematuria. This has since been re-started. Monitor bleeding.    [name] Kitchen. HCAP (healthcare-associated pneumonia) 10/25/2014  . HCAP  (healthcare-associated pneumonia) 11/24/2017  . History of anticoagulant therapy 10/21/2012  . History of endometriosis 10/2012   OBGYN [facility]: Laparoscopic Endometrial Ablation, TVH,  . History of recurrent UTIs   . Hypertension   . HYPERTENSION, BENIGN 01/31/2010   Qualifier: Diagnosis of  By: Gala RomneyBensimhon, MD, Trixie DredgeFACC, Daniel R   . Incontinent of feces 03/11/2016  . Infantile cerebral palsy ([name]) 01/29/2007   Overview:  She has spastic CP.  She is mentally intact.  She is wheelchair bound.  She is wheelchair bound for ambulation      . Intracervical pessary 05/07/2011   Overview:  Overview:  [facility]11 to treat DUB and Endometriosis.  [facility] but was considered a poor surgical candidate and [facility]  For  DUB she had pelvic ultrasound that showed thin stripe and she had endometrial Bx by Dr Jennette Kettle in GYN clinic, which was negative.     . Macroscopic hematuria 03/10/2013   status post replacement of suprapubic tube 03/04/2013   . Migraine   . OCD (obsessive compulsive disorder)   . Parapneumonic effusion 10/25/2014  . Presence of intrathecal baclofen pump 03/07/2014   R LQ. Insertion T10.    [name] Kitchen Psychiatric illness 10/31/2014  . Pulmonary embolism ([name])    Lifetime Coumadin  . Pulmonary embolism ([name]) 2011, 01/2011   Will be on lifetime coumadin  . Recurrent pulmonary embolism ([name]) 05/03/2011   Pt has history of recurrent PE and is on chronic coumadin.  Pt has been complaining of dyspnea that she describes as chest tightness.  She has been to the ER multiple times for this.  Usually she gets CTA and serial CE.  She has had at least 6 CTA in a period of since 2011.  She also has an O2 requirement. It is unclear why pt feels dyspneic, complains of chest tightness, or has O2 requirement.  I thought that her complaint may be from deconditioning and referred her to PT.  Unfortunately Medicaid only pays for 4 PT visits.  Pt should be doing these exercises  at home, but she has not.  I have referred her to [facility], and she has been seen by [name].  He did not understand the etiology of her dyspnea and O2 requirement.  He will continue to follow her.     . Seborrheic keratosis 01/18/2011  . Seizures ([name]) 02/12/2016  . Spasticity 01/05/2014  . Transient alteration of awareness, recurrent 09/08/2006   Recurrent episodes where Dion  loses contact with her world and that have been studied thoroughly and appear nonepileptic in nature per Dr Terrace Arabia (neurology) The patient has had episodes starting in 2007 that initially began 1-4 times a day during which time she would have periods of unawareness followed by confusion. This continuous video EEG monitoring performed from October 8-12, 2007. Had   . Urethra dilated and patulous 07/03/2015   Patulous, Dilated Urethra. 5mL balloon on a 22-Fr catheter hoping this would prevent the bladder spasm from pushing the balloon down the urethra (Dr Logan Bores, Urology [facility], July 2016)     Patient Active Problem List   Diagnosis Date Noted  . Connective tissue disorder ([name]) 08/07/2018  . Migraine without aura and without status migrainosus, not intractable 06/26/2018  . UTI (urinary tract infection) 05/25/2018  . Headache 05/25/2018  . Incontinent of feces 03/11/2016  . Chronic anticoagulation 03/11/2016  . Involuntary movements 02/22/2016  . Seizures ([name]) 02/12/2016  . Dyspnea 02/12/2016  . Cerebral palsy, quadriplegic ([name]) 02/06/2016  . Swelling of extremity 01/13/2016  . Fever   . Pressure ulcer 10/09/2015  . Status post insertion of inferior vena caval filter 09/28/2015  . Absence of bladder continence 08/25/2015  . Person living in residential institution 06/14/2015  . Pulmonary hypertension ([name])   . Anemia of chronic disease   . Spasticity 03/15/2015  . Constipation, chronic 10/10/2014  . Athetoid cerebral palsy ([name]) 04/13/2014  . Dystonia 04/12/2014  . UTI (lower urinary tract infection) 03/16/2014    . Presence of intrathecal baclofen pump 03/07/2014  . Dental caries 07/18/2013  . Multiple thyroid nodules 05/20/2013  . Osteoarthrosis 04/07/2013  . [name]   . Dysarthria   . GERD (gastroesophageal reflux disease)   . Neurogenic bladder 02/05/2013  . Anxiety  state 10/21/2012  . Chronic pain syndrome 06/17/2012  . Recurrent pulmonary embolism ([name]) 05/03/2011  . Pulmonary embolism with infarction ([name]) 05/03/2011  . Chronic suprapubic catheter ([name]) 03/15/2011  . Disease of female genital organs 10/24/2010  . HYPERTENSION, BENIGN 01/31/2010  . CP (cerebral palsy), spastic ([name]) 12/27/2008  . Contracted, joint, multiple sites 12/27/2008  . Major depressive disorder, recurrent episode ([name]) 01/29/2007  . Obsessive Compulsive Disorder 01/29/2007  . Transient alteration of awareness, recurrent 09/08/2006    Past Surgical History:  Procedure Laterality Date  . ABDOMINAL HYSTERECTOMY    . APPENDECTOMY    . BACLOFEN PUMP REFILL     x 3 times  . CARPAL TUNNEL RELEASE  08/2008   Dr Teressa Senter  . CESAREAN SECTION     x 2  . CHOLECYSTECTOMY  10/14/2015   Procedure: LAPAROSCOPIC CHOLECYSTECTOMY;  Surgeon: Violeta Gelinas, MD;  Location: [facility] OR;  Service: General;;  . COLPOSCOPY  06/2000  . INTRAUTERINE DEVICE INSERTION  04/30/11   Inserted by [facility] for endometriosis  . LAPAROSCOPIC ASSISTED VAGINAL HYSTERECTOMY  10/27/2012  . MULTIPLE EXTRACTIONS WITH ALVEOLOPLASTY Bilateral 07/19/2013   Procedure: EXTRACTIONS #4, 1,61,09,60;  Surgeon: Francene Finders, DDS;  Location: [facility] OR;  Service: Oral Surgery;  Laterality: Bilateral;  . NEUROMA SURGERY Left    anterior and posterior intersosseous  . PAIN PUMP IMPLANTATION N/A 03/07/2014   Procedure: baclofen pump revision/replacement and Catheter connection replacement;  Surgeon: Cristi Loron, MD;  Location: [facility] NEURO ORS;  Service: Neurosurgery;  Laterality: N/A;  baclofen pump revision/replacement and Catheter  connection replacement  . PROGRAMABLE BACLOFEN PUMP REVISION  03/17/14   Battery Replacement   . TUBAL LIGATION  2003  . URETHRA SURGERY    . WRIST SURGERY  06/2010   Dr Dierdre Searles, hand surgeon, [name]     OB History   None      Home Medications    Prior to Admission medications   Medication Sig Start Date End Date Taking? Authorizing Provider  acetaminophen (TYLENOL 8 HOUR) 650 MG CR tablet Take 1 tablet (650 mg total) by mouth every 8 (eight) hours as needed. Patient taking differently: Take 650 mg by mouth every 8 (eight) hours as needed for pain.  07/08/18   Derwood Kaplan, MD  amantadine (SYMMETREL) 100 MG capsule Take 100 mg by mouth daily.     [provider]  baclofen (LIORESAL) 20 MG tablet Take 20 mg by mouth 4 (four) times daily. For bladder spasms    [provider]  bisacodyl (DULCOLAX) 10 MG suppository Place 10 mg rectally 2 (two) times daily as needed for moderate constipation.     [provider]  Buprenorphine HCl (BELBUCA) 450 MCG FILM Place 450 mg inside cheek every 12 (twelve) hours.    [provider]  busPIRone (BUSPAR) 5 MG tablet Take 5 mg by mouth 3 (three) times daily.    [provider]  butalbital-acetaminophen-caffeine (FIORICET, ESGIC) 50-325-40 MG tablet Take 2 tablets by mouth every 4 (four) hours as needed for headache (not controlled by Tylenol). 05/29/18   Darlin Drop, DO  clonazePAM (KLONOPIN) 0.5 MG tablet Take 1.5 tablets (0.75 mg total) by mouth 3 (three) times daily. 11/27/17   Narda Bonds, MD  feeding supplement, ENSURE ENLIVE, (ENSURE ENLIVE) LIQD Take 237 mLs by mouth 2 (two) times daily between meals. 05/30/18   Darlin Drop, DO  ferrous sulfate 325 (65 FE) MG tablet Take 325 mg by mouth daily.  [provider]  fluconazole (DIFLUCAN) 200 MG tablet Take 1 tablet (200 mg total) by mouth daily for 14 days. 08/16/18 08/30/18  Harlene Salts A, PA-C  FLUoxetine (PROZAC) 40 MG capsule Take 80  mg by mouth daily.    [provider]  lactulose (CHRONULAC) 10 GM/15ML solution Take 20 g by mouth daily as needed for mild constipation or moderate constipation.    [provider]  lamoTRIgine (LAMICTAL) 100 MG tablet Take 100 mg by mouth at bedtime.    [provider]  linaclotide (LINZESS) 145 MCG CAPS capsule Take 145 mcg by mouth daily before breakfast.    [provider]  lubiprostone (AMITIZA) 24 MCG capsule Take 1 capsule (24 mcg total) by mouth 2 (two) times daily with a meal. Patient not taking: Reported on 07/07/2018 01/01/17   Esterwood, [name] S, PA-C  Melatonin 5 MG TABS Take 5 mg by mouth at bedtime.     [provider]  Multiple Vitamins-Minerals (CERTA-VITE PO) Take 1 tablet by mouth daily.    [provider]  oxybutynin (DITROPAN) 5 MG tablet Take 5 mg by mouth 4 (four) times daily.  02/20/16   [provider]  oxyCODONE (ROXICODONE) 5 MG immediate release tablet Take 5 mg by mouth every 4 (four) hours as needed for moderate pain or severe pain.    [provider]  polyethylene glycol powder (GLYCOLAX/MIRALAX) powder Take 1  1/2 dose  daily in 12 ounces of fluid every day. Patient taking differently: Take 17 g by mouth 2 (two) times daily.  01/01/17   Esterwood, [name] S, PA-C  QUEtiapine (SEROQUEL) 50 MG tablet Take 150 mg by mouth at bedtime.    [provider]  rivaroxaban (XARELTO) 10 MG TABS tablet Take 10 mg by mouth daily.    [provider]  sulfamethoxazole-trimethoprim (BACTRIM DS,SEPTRA DS) 800-160 MG tablet Take 1 tablet by mouth 2 (two) times daily for 7 days. 08/16/18 08/23/18  Harlene Salts A, PA-C  tiZANidine (ZANAFLEX) 4 MG capsule Take 4 mg by mouth 2 (two) times daily.     [provider]  topiramate (TOPAMAX) 25 MG tablet Take 3 tablets (75mg ) at bedtime for 2 weeks, then take 4 tablets (100mg ) at bedtime Patient taking differently: Take 75-100 mg by mouth See admin  instructions. Take 75 mg by mouth at bedtime for 2 weeks until 08/21/18. Then start 100 mg by mouth at bedtime on 08/22/18 08/07/18   Elveria Rising, NP    Family History Family History  Problem Relation Age of Onset  . Asthma Father   . Colon cancer Maternal Grandmother        Died in her 20's  . Breast cancer Paternal Grandmother        Died in her 34's  . Heart attack Maternal Grandfather        Died in his 51's  . Alzheimer's disease Paternal Grandfather        Died in his 29's  . Breast cancer Mother   . Stomach cancer Neg Hx     Social History Social History   Tobacco Use  . Smoking status: Never Smoker  . Smokeless tobacco: Never Used  Substance Use Topics  . Alcohol use: Yes    Alcohol/week: 1.0 standard drinks    Types: 1 Glasses of wine per week    Comment: Drinks alcohol once a month.  . Drug use: No     Allergies   Morphine   Review of Systems  Review of Systems  Unable to perform ROS: Other  Constitutional: Positive for fever.  language barrier   Physical Exam Updated Vital Signs BP 97/60   Pulse 95   Temp 100 F (37.8 C) (Rectal)   Resp 15   LMP 04/05/2011   SpO2 97%   Physical Exam  Constitutional: She is oriented to person, place, and time. She appears well-developed and well-nourished. No distress.  HENT:  Head: Normocephalic and atraumatic.  Moist mucous membranes  Eyes: Pupils are equal, round, and reactive to light. Conjunctivae are normal.  Neck: Neck supple.  Cardiovascular: Normal rate, regular rhythm and normal heart sounds.  No murmur heard. Pulmonary/Chest: Effort normal and breath sounds normal.  Abdominal: Soft. Bowel sounds are normal. She exhibits no distension. There is generalized tenderness. There is no rigidity, no rebound and no guarding.  Genitourinary:  Genitourinary Comments: Superpubic catheter draining yellow urine  Musculoskeletal: She exhibits no edema.  Neurological: She is alert and oriented to person,  place, and time. She exhibits abnormal muscle tone.  Difficulty with speech, severe contractures of b/l arms R>L and muscle atrophy b/l legs  Skin: Skin is warm and dry.  Psychiatric: She has a normal mood and affect. Judgment normal.  Nursing note and vitals reviewed.    ED Treatments / Results  Labs (all labs ordered are listed, but only abnormal results are displayed) Labs Reviewed  URINALYSIS, ROUTINE W REFLEX MICROSCOPIC - Abnormal; Notable for the following components:      Result Value   APPearance CLOUDY (*)    Protein, ur 30 (*)    Nitrite POSITIVE (*)    Leukocytes, UA LARGE (*)    WBC, UA >50 (*)    Bacteria, UA FEW (*)    All other components within normal limits  BASIC METABOLIC PANEL - Abnormal; Notable for the following components:   Glucose, Bld 110 (*)    All other components within normal limits  I-STAT CG4 LACTIC ACID, ED - Abnormal; Notable for the following components:   Lactic Acid, Venous 3.91 (*)    All other components within normal limits  URINE CULTURE  CULTURE, BLOOD (ROUTINE X 2)  CULTURE, BLOOD (ROUTINE X 2)  CBC  PROTIME-INR  I-STAT CG4 LACTIC ACID, ED    EKG None  Radiology Dg Chest 2 View  Result Date: 08/19/2018 CLINICAL DATA:  Urinary tract infection, fever and headache. History of cerebral palsy. EXAM: CHEST - 2 VIEW COMPARISON:  Chest radiograph August 16, 2018. FINDINGS: Cardiomediastinal silhouette is unremarkable for this low inspiratory examination with crowded vasculature markings. Stable RIGHT basilar strandy densities. Trachea projects midline and there is no pneumothorax. Included soft tissue planes and osseous structures are non-suspicious. Cervicothoracic hardware. IMPRESSION: 1. Stable RIGHT basilar atelectasis in this low inspiratory examination. Electronically Signed   By: Awilda Metro M.D.   On: 08/19/2018 14:09    Procedures .Critical Care Performed by: Laurence Spates, MD Authorized by: Laurence Spates, MD   Critical care provider statement:    Critical care time (minutes):  30   Critical care time was exclusive of:  Separately billable procedures and treating other patients   Critical care was necessary to treat or prevent imminent or life-threatening deterioration of the following conditions:  Sepsis   Critical care was time spent personally by me on the following activities:  Development of treatment plan with patient or surrogate, evaluation of patient's response to treatment, obtaining history from patient or surrogate, ordering and performing treatments and  interventions, ordering and review of laboratory studies, ordering and review of radiographic studies, re-evaluation of patient's condition and review of old charts BLADDER CATHETERIZATION Date/Time: 08/19/2018 4:20 PM Performed by: Laurence Spates, MD Authorized by: Laurence Spates, MD   Consent:    Consent obtained:  Verbal   Consent given by:  Patient Pre-procedure details:    Procedure purpose:  Therapeutic Anesthesia (see MAR for exact dosages):    Anesthesia method:  None Procedure details:    Provider performed due to:  Complicated insertion   Catheter insertion:  Indwelling   Catheter type:  Foley   Catheter size:  20 Fr   Bladder irrigation: no     Number of attempts:  1   Urine characteristics:  Clear and yellow Post-procedure details:    Patient tolerance of procedure:  Tolerated well, no immediate complications Comments:     SUPRAPUBIC CATHETER INSERTION Performed under sterile technique   (including critical care time)  Medications Ordered in ED Medications  iopamidol (ISOVUE-300) 61 % injection 100 mL (has no administration in time range)  iopamidol (ISOVUE-300) 61 % injection (has no administration in time range)  piperacillin-tazobactam (ZOSYN) IVPB 3.375 g (0 g Intravenous Stopped 08/19/18 1427)  vancomycin (VANCOCIN) IVPB 1000 mg/200 mL premix (1,000 mg Intravenous New Bag/Given  08/19/18 1432)  iopamidol (ISOVUE-300) 61 % injection 100 mL (100 mLs Intravenous Contrast Given 08/19/18 1457)     Initial Impression / Assessment and Plan / ED Course  I have reviewed the triage vital signs and the nursing notes.  Pertinent labs & imaging results that were available during my care of the patient were reviewed by me and considered in my medical decision making (see chart for details).     Patient was alert and oriented on exam, temp 100 rectally, remainder vital signs normal.  She had some mild generalized abdominal tenderness without rebound or guarding. UA c/w infection liver difficult to interpret as it is always contaminated due to suprapubic [place].  I exchanged catheter.  CBC within normal limits, BMP unremarkable, lactate 3.91, cleared after IV fluids.  Based on lactate and report of altered mental status, treated with sepsis protocol with blood and urine cultures, vancomycin, and Zosyn.  Obtain CT of chest, abdomen, and pelvis to evaluate for occult source of infection as she had some atelectasis on chest x-ray and generalized abdominal tenderness on exam.  CT shows bladder wall thickening which suggests infection, large stool burden.  No acute pulmonary process.  Discussed admission with Dr. Lafe Garin and pt admitted for further care.  Final Clinical Impressions(s) / ED Diagnoses   Final diagnoses:  Complicated UTI (urinary tract infection)  Fever, unspecified fever cause    ED Discharge Orders    None       Jemel Ono, Ambrose Finland, MD 08/19/18 1630

## 2018-08-19 NOTE — ED Notes (Signed)
Patient transported to CT 

## 2018-08-19 NOTE — ED Notes (Signed)
RN notified patient has critical Lactic Acid value of 3.91, RN will notify a provider once one signs up for patient

## 2018-08-19 NOTE — ED Notes (Signed)
RN attempted IV x1 without success. Blood return noted, then vein was compromised

## 2018-08-19 NOTE — ED Notes (Signed)
Pt noted to have a reddened face and neck at this time. pts vitals WDL. Temp 98.3 oral. Pt did have drink in the last 15 mins. Will obtain new temperature in 10-15 mins. MD made aware. Pt reports " I just feel hot"

## 2018-08-19 NOTE — ED Triage Notes (Signed)
Per EMS pt currently being treated for UTI; complaint of fever and headache[facility]

## 2018-08-19 NOTE — ED Notes (Signed)
Hospitalist at bedside 

## 2018-08-19 NOTE — ED Notes (Signed)
ED TO INPATIENT HANDOFF REPORT  Name/Age/Gender [name] [name] 42 y.o. female  Code Status Code Status History    Date Active Date Inactive Code Status Order ID Comments User Context   05/25/2018 2059 05/29/2018 1956 Full Code [id number]  Jani Gravel, MD ED   11/24/2017 0313 11/27/2017 1910 Full Code [id number]  Vianne Bulls, MD ED   10/09/2015 0216 10/30/2015 2103 Full Code [id number]  Minor, Grace Bushy, NP ED   06/09/2015 0249 06/14/2015 1958 Full Code [id number]  Lorna Few, DO Inpatient   10/22/2014 1709 10/28/2014 1556 Full Code [id number]  Frazier Richards, MD Inpatient   03/10/2014 2127 03/15/2014 1834 Full Code [id number]  Leeanne Rio, MD Inpatient   03/07/2014 1521 03/08/2014 2000 Full Code [id number]  Newman Pies, MD Inpatient      Home/SNF/Other Skilled nursing facility  Chief Complaint Headache  Level of Care/Admitting Diagnosis ED Disposition    ED Disposition Condition [place]: [facility] [[id number]]  Level of Care: Med-Surg [16]  Diagnosis: UTI (urinary tract infection) [427062]  Admitting Physician: Caren Griffins [phone]  Attending Physician: Caren Griffins [phone]  Estimated length of stay: past midnight tomorrow  Certification:: I certify this patient will need inpatient services for at least 2 midnights  PT Class (Do Not Modify): Inpatient [101]  PT Acc Code (Do Not Modify): Private [1]       Medical History Past Medical History:  Diagnosis Date  . Abdominal pain 01/02/2012   Overview:  Overview:  Per Previous pcp note- Dr. Ta--Pt has history of endometriosis and had DUB in 2011.  She She had u/s that showed normal endometrial stripe.  She had endometrial bx that was wnl.  She has a lot of abd cramping every month.  This cramping was sometimes not relieved with hydrocodone.  She was referred to [facility] for IUD placement to help endometriosis and abd cramping.  [name]  . Abdominal pain 01/02/2012    Overview:  Overview:  Per Previous pcp note- Dr. Ta--Pt has history of endometriosis and had DUB in 2011.  She She had u/s that showed normal endometrial stripe.  She had endometrial bx that was wnl.  She has a lot of abd cramping every month.  This cramping was sometimes not relieved with hydrocodone.  She was referred to [facility] for IUD placement to help endometriosis and abd cramping.  [name] was placed 04/30/11 by [facility].  Pt still complains of abd cramping, which I am treating with hydrocodone and ultram. Pt reports some initial relief of pain from IUD, but states it has now returned back to similar to previous cramping. Gave patient a shot of Toradol 60 mg IM today-since positive exacerbation of abdominal cramping during past 2 weeks. Physical exam reassuring. Patient may need to return to [place] for further workup since she is plugged in with the GYN providers there for further treatment options regarding endometriosis and abdominal cramping. The dysfunctional uterine bleeding now well contr  . Acute cystitis with hematuria   . Acute cystitis without hematuria   . Acute respiratory failure with hypoxia ([place])   . Anemia   . Cellulitis 12/22/2014  . Cerebral palsy ([name])    Spastic Cerebral palsy, mentally intact  . Contracture, joint, multiple sites    Electric wheelchair, uses left hand to operate chair.   . Depression   . Disease of female genital organs 10/24/2010   Overview:  Overview:  Pt has history  of endometriosis and had DUB in 2011.  She had u/s that showed normal endometrial stripe.  She had endometrial bx that was wnl.  She has a lot of abd cramping every month.  This cramping was sometimes not relieved with hydrocodone.  She was referred to [facility] for IUD placement to help endometriosis and abd cramping.  [name] was placed 04/30/11 by [facility].  Pt still complains of abd cramping, which was treated with hydrocodone and ultram.   Last seen at [facility] by OB/GYN 12/24/11, given  Depo Lupron injection.  Ordering CT scan to r/o additional cause of abdominal pain (pt with known endometriosis and will not tolerate vaginal U/S, pain not improved s/p [name] or with Lupron injection-- only caused hot flashes and amenorrhea, no pain relief).  Also made referral to pain clinic for better pain control.  Do not think hysterectomy would help with pain, and concern for contamination of baclofen pump if did proceed with surgery.    8/5/13[name]: Pt was seen by   . [name]   . Dysarthria   . Dyspnea and respiratory abnormality 02/04/2011   Overview:  Overview:  Pt has history of recurrent PE and is on chronic coumadin.  Pt has been complaining of dyspnea that she describes as chest tightness.  She has been to the ER multiple times for this.  Usually she gets CTA and serial CE.  She has had at least 6 CTA in a period of since 2011.  She also has an O2 requirement. It is unclear why pt feels dyspneic, complains of chest tightness, or has O2 requirement.  I thought that her complaint may be from deconditioning and referred her to PT.  Unfortunately Medicaid only pays for 4 PT visits.  Pt should be doing these exercises at home, but she has not.  I have referred her to [facility], and she has been seen by Dr Chase Caller.  He did not understand the etiology of her dyspnea and O2 requirement.  He will continue to follow her.   Last Assessment & Plan:  Pt presents with concerns for chest tightness and dyspnea.  She was seen in ED yesterday and discharged home.  While there she had a CT chest 04/20/11 showed 1.  Multifocal degradation as detailed above.  N  . Endometriosis   . GERD (gastroesophageal reflux disease)   . Gram positive sepsis ([place]) 10/24/2014  . Gross hematuria 03/12/2013   Overview:  Overview:  status post replacement of suprapubic tube 03/04/2013  Last Assessment & Plan:  Recent urine culture collected in ED on 5/4 did not result in significant growth. Moreover,  patient has been asymptomatic with normal WBC, no fever and no true symptoms making symptomatic UTI less likely.  Will continue to monitor for any sign of infection.  Patient's xarelto had been held during her recent hospitalization due to hematuria. This has since been re-started. Monitor bleeding.    [name] Kitchen HCAP (healthcare-associated pneumonia) 10/25/2014  . HCAP (healthcare-associated pneumonia) 11/24/2017  . History of anticoagulant therapy 10/21/2012  . History of endometriosis 10/2012   OBGYN [facility]: Laparoscopic Endometrial Ablation, TVH,  . History of recurrent UTIs   . Hypertension   . HYPERTENSION, BENIGN 01/31/2010   Qualifier: Diagnosis of  By: [name], MD, [name] Incontinent of feces 03/11/2016  . Infantile cerebral palsy ([place]) 01/29/2007   Overview:  She has spastic CP.  She is mentally intact.  She is wheelchair bound.  She is  wheelchair bound for ambulation      . Intracervical pessary 05/07/2011   Overview:  Overview:  Placed by [facility] 04/30/11 to treat DUB and Endometriosis.  She is seen at GYN clinic at [facility] but was considered a poor surgical candidate and referred her to [facility].  For DUB she had pelvic ultrasound that showed thin stripe and she had endometrial Bx by Dr Nori Riis in [place] clinic, which was negative.     . Macroscopic hematuria 03/10/2013   status post replacement of suprapubic tube 03/04/2013   . Migraine   . OCD (obsessive compulsive disorder)   . Parapneumonic effusion 10/25/2014  . Presence of intrathecal baclofen pump 03/07/2014   R LQ. Insertion T10.    [name] Kitchen Psychiatric illness 10/31/2014  . Pulmonary embolism ([name])    Lifetime Coumadin  . Pulmonary embolism ([place]) 2011, 01/2011   Will be on lifetime coumadin  . Recurrent pulmonary embolism ([place]) 05/03/2011   Pt has history of recurrent PE and is on chronic coumadin.  Pt has been complaining of dyspnea that she describes as chest tightness.  She has been to the ER multiple times for this.  Usually she  gets CTA and serial CE.  She has had at least 6 CTA in a period of since 2011.  She also has an O2 requirement. It is unclear why pt feels dyspneic, complains of chest tightness, or has O2 requirement.  I thought that her complaint may be from deconditioning and referred her to PT.  Unfortunately Medicaid only pays for 4 PT visits.  Pt should be doing these exercises at home, but she has not.  I have referred her to [facility], and she has been seen by Dr Chase Caller.  He did not understand the etiology of her dyspnea and O2 requirement.  He will continue to follow her.     . Seborrheic keratosis 01/18/2011  . Seizures ([place]) 02/12/2016  . Spasticity 01/05/2014  . Transient alteration of awareness, recurrent 09/08/2006   Recurrent episodes where Jerelyn  loses contact with her world and that have been studied thoroughly and appear nonepileptic in nature per Dr Krista Blue (neurology) The patient has had episodes starting in 2007 that initially began 1-4 times a day during which time she would have periods of unawareness followed by confusion. This continuous video EEG monitoring performed from October 8-12, 2007. Had   . Urethra dilated and patulous 07/03/2015   Patulous, Dilated Urethra. 30m balloon on a 22-Fr catheter hoping this would prevent the bladder spasm from pushing the balloon down the urethra (Dr EAmalia [facility], July 2016)     Allergies Allergies  Allergen Reactions  . Morphine Dermatitis and Other (See Comments)    Skin turned red    IV Location/Drains/Wounds Patient Lines/Drains/Airways Status   Active Line/Drains/Airways    Name:   Placement date:   Placement time:   Site:   Days:   Peripheral IV 08/19/18 Right Antecubital   08/19/18    1317    Antecubital   less than 1   Suprapubic Catheter Non-latex 20 Fr.   08/19/18    1616    Non-latex   less than 1   Incision (Closed) 10/14/15 Abdomen Bilateral   10/14/15    1435     1040   Incision (Closed) 11/24/17 Cervical Anterior;Posterior   11/24/17     0533     268   Incision (Closed) 11/24/17 Cervical Right   11/24/17    0533  268   Incision (Closed) 11/24/17 Vertebral column Posterior;Upper;Mid   11/24/17    0533     268   Incision - 4 Ports Abdomen 1: Umbilicus 2: Mid;Upper 3: Right;Upper 4: Left;Upper   10/14/15    1434     1040   Pressure Ulcer 03/14/14 Stage I -  Intact skin with non-blanchable redness of a localized area usually over a bony prominence. buttock redden    03/14/14    2200     1619   Pressure Ulcer 10/22/14 Stage II -  Partial thickness loss of dermis presenting as a shallow open ulcer with a red, pink wound bed without slough. almost appears as a skin tear, no drainage   10/22/14    1728     1397   Pressure Ulcer 10/29/15 Stage II -  Partial thickness loss of dermis presenting as a shallow open ulcer with a red, pink wound bed without slough.   10/29/15    0915     1025   Wound / Incision (Open or Dehisced) 11/24/17 Sacrum Medial Redness, with open sore   11/24/17    0533    Sacrum   268          Labs/Imaging Results for orders placed or performed during the hospital encounter of 08/19/18 (from the past 48 hour(s))  Urinalysis, Routine w reflex microscopic- may I&O cath if menses     Status: Abnormal   Collection Time: 08/19/18 12:41 PM  Result Value Ref Range   Color, Urine YELLOW YELLOW   APPearance CLOUDY (A) CLEAR   Specific Gravity, Urine 1.018 1.005 - 1.030   pH 6.0 5.0 - 8.0   Glucose, UA NEGATIVE NEGATIVE mg/dL   Hgb urine dipstick NEGATIVE NEGATIVE   Bilirubin Urine NEGATIVE NEGATIVE   Ketones, ur NEGATIVE NEGATIVE mg/dL   Protein, ur 30 (A) NEGATIVE mg/dL   Nitrite POSITIVE (A) NEGATIVE   Leukocytes, UA LARGE (A) NEGATIVE   RBC / HPF 11-20 0 - 5 RBC/hpf   WBC, UA >50 (H) 0 - 5 WBC/hpf   Bacteria, UA FEW (A) NONE SEEN   Mucus PRESENT    Hyaline Casts, UA PRESENT     Comment: Performed at [facility], [place] [address] [place], [place] [address]  Basic metabolic panel      Status: Abnormal   Collection Time: 08/19/18 12:56 PM  Result Value Ref Range   Sodium 145 135 - 145 mmol/L   Potassium 4.5 3.5 - 5.1 mmol/L   Chloride 108 98 - 111 mmol/L   CO2 26 22 - 32 mmol/L   Glucose, Bld 110 (H) 70 - 99 mg/dL   BUN 20 6 - 20 mg/dL   Creatinine, Ser 0.82 0.44 - 1.00 mg/dL   Calcium 10.1 8.9 - 10.3 mg/dL   GFR calc non Af Amer >60 >60 mL/min   GFR calc Af Amer >60 >60 mL/min    Comment: (NOTE) The eGFR has been calculated using the CKD EPI equation. This calculation has not been validated in all clinical situations. eGFR's persistently <60 mL/min signify possible Chronic Kidney Disease.    Anion gap 11 5 - 15    Comment: Performed at [facility], [place] [address] [place], [place] [address]  CBC     Status: None   Collection Time: 08/19/18 12:56 PM  Result Value Ref Range   WBC 6.1 4.0 - 10.5 K/uL   RBC 4.30 3.87 - 5.11 MIL/uL   Hemoglobin  13.2 12.0 - 15.0 g/dL   HCT 39.9 36.0 - 46.0 %   MCV 92.8 78.0 - 100.0 fL   MCH 30.7 26.0 - 34.0 pg   MCHC 33.1 30.0 - 36.0 g/dL   RDW 13.9 11.5 - 15.5 %   Platelets 223 150 - 400 K/uL    Comment: Performed at [facility], [place] [address] [place], [place] [address]  Protime-INR     Status: None   Collection Time: 08/19/18 12:56 PM  Result Value Ref Range   Prothrombin Time 13.7 11.4 - 15.2 seconds   INR 1.06     Comment: Performed at [facility], [place] [address], [place], [place] [address]  I-Stat CG4 Lactic Acid, ED     Status: Abnormal   Collection Time: 08/19/18  1:18 PM  Result Value Ref Range   Lactic Acid, Venous 3.91 (HH) 0.5 - 1.9 mmol/L   Comment NOTIFIED PHYSICIAN   I-Stat CG4 Lactic Acid, ED     Status: None   Collection Time: 08/19/18  4:07 PM  Result Value Ref Range   Lactic Acid, Venous 1.42 0.5 - 1.9 mmol/L   Dg Chest 2 View  Result Date: 08/19/2018 CLINICAL DATA:  Urinary tract infection, fever and headache. History of cerebral palsy.  EXAM: CHEST - 2 VIEW COMPARISON:  Chest radiograph August 16, 2018. FINDINGS: Cardiomediastinal silhouette is unremarkable for this low inspiratory examination with crowded vasculature markings. Stable RIGHT basilar strandy densities. Trachea projects midline and there is no pneumothorax. Included soft tissue planes and osseous structures are non-suspicious. Cervicothoracic hardware. IMPRESSION: 1. Stable RIGHT basilar atelectasis in this low inspiratory examination. Electronically Signed   By: Elon Alas M.D.   On: 08/19/2018 14:09   Ct Chest W Contrast  Result Date: 08/19/2018 CLINICAL DATA:  Urinary tract infection. Fever and headache. Prior cholecystectomy. Prior hysterectomy. Cerebral palsy. Chronic suprapubic catheter. EXAM: CT CHEST, ABDOMEN, AND PELVIS WITH CONTRAST TECHNIQUE: Multidetector CT imaging of the chest, abdomen and pelvis was performed following the standard protocol during bolus administration of intravenous contrast. CONTRAST:  130m ISOVUE-300 IOPAMIDOL (ISOVUE-300) INJECTION 61% COMPARISON:  Chest radiograph 08/19/2018. Abdominopelvic CT 07/20/2018. Chest CT 10/27/2015. FINDINGS: CT CHEST FINDINGS Cardiovascular: Normal caliber of the aorta and branch vessels. Normal heart size, without pericardial effusion. Mediastinum/Nodes: Left-sided thyroid enlargement. No mediastinal or hilar adenopathy. Soft tissue density in the anterior mediastinum is favored to represent thymic tissue at 1.5 cm on image 24/2. Lungs/Pleura: No pleural fluid. Right hemidiaphragm elevation. A 5 mm right lower lobe pulmonary nodule on image 82/6 is not readily apparent on priors, but may have been obscured by atelectasis in this region. No lobar consolidation. Musculoskeletal: Cervical and thoracic spine fixation. CT ABDOMEN PELVIS FINDINGS Hepatobiliary: Suspicion of mild hepatic steatosis. Cholecystectomy, without biliary ductal dilatation. Pancreas: Normal, without mass or ductal dilatation. Spleen:  Normal in size, without focal abnormality. Adrenals/Urinary Tract: Normal adrenal glands. Upper pole left renal 1.3 cm lesion measures slightly greater than fluid density, similar in size on 10/27/2015. Normal right kidney, without hydronephrosis. Suprapubic bladder catheter. The bladder is decompressed and thick walled. Similar. Stomach/Bowel: Normal stomach, without wall thickening. Marked colonic distention with stool throughout. This is similar to the most recent exam. Normal small bowel caliber. Vascular/Lymphatic: Normal caliber of the aorta and branch vessels. IVC filter, appropriately positioned. No abdominopelvic adenopathy. Reproductive: Hysterectomy.  No adnexal mass. Other: No significant free fluid. Musculoskeletal: Dorsal spinal stimulator.  Right iliac bone island. IMPRESSION: 1. Decompressed, thick walled bladder again  identified. Cannot exclude cystitis. 2. No other explanation for fever. 3. Large colonic stool burden, suggesting constipation and fecal impaction. This is unchanged. 4. 5 mm right lower lobe pulmonary nodule. No follow-up needed if patient is low-risk. Non-contrast chest CT can be considered in 12 months if patient is high-risk. This recommendation follows the consensus statement: Guidelines for Management of Incidental Pulmonary Nodules Detected on CT Images: From the Fleischner Society 2017; Radiology 2017; 284:228-243. 5. Probable thymic hyperplasia/rebound within the anterior mediastinum. Consider chest CT follow-up at 3-6 months to confirm stability or resolution. 6. Left renal lesion is favored to represent a minimally complex cyst, given size stability back to 2016. Electronically Signed   By: Abigail Miyamoto M.D.   On: 08/19/2018 16:12   Ct Abdomen Pelvis W Contrast  Result Date: 08/19/2018 CLINICAL DATA:  Urinary tract infection. Fever and headache. Prior cholecystectomy. Prior hysterectomy. Cerebral palsy. Chronic suprapubic catheter. EXAM: CT CHEST, ABDOMEN, AND PELVIS  WITH CONTRAST TECHNIQUE: Multidetector CT imaging of the chest, abdomen and pelvis was performed following the standard protocol during bolus administration of intravenous contrast. CONTRAST:  167m ISOVUE-300 IOPAMIDOL (ISOVUE-300) INJECTION 61% COMPARISON:  Chest radiograph 08/19/2018. Abdominopelvic CT 07/20/2018. Chest CT 10/27/2015. FINDINGS: CT CHEST FINDINGS Cardiovascular: Normal caliber of the aorta and branch vessels. Normal heart size, without pericardial effusion. Mediastinum/Nodes: Left-sided thyroid enlargement. No mediastinal or hilar adenopathy. Soft tissue density in the anterior mediastinum is favored to represent thymic tissue at 1.5 cm on image 24/2. Lungs/Pleura: No pleural fluid. Right hemidiaphragm elevation. A 5 mm right lower lobe pulmonary nodule on image 82/6 is not readily apparent on priors, but may have been obscured by atelectasis in this region. No lobar consolidation. Musculoskeletal: Cervical and thoracic spine fixation. CT ABDOMEN PELVIS FINDINGS Hepatobiliary: Suspicion of mild hepatic steatosis. Cholecystectomy, without biliary ductal dilatation. Pancreas: Normal, without mass or ductal dilatation. Spleen: Normal in size, without focal abnormality. Adrenals/Urinary Tract: Normal adrenal glands. Upper pole left renal 1.3 cm lesion measures slightly greater than fluid density, similar in size on 10/27/2015. Normal right kidney, without hydronephrosis. Suprapubic bladder catheter. The bladder is decompressed and thick walled. Similar. Stomach/Bowel: Normal stomach, without wall thickening. Marked colonic distention with stool throughout. This is similar to the most recent exam. Normal small bowel caliber. Vascular/Lymphatic: Normal caliber of the aorta and branch vessels. IVC filter, appropriately positioned. No abdominopelvic adenopathy. Reproductive: Hysterectomy.  No adnexal mass. Other: No significant free fluid. Musculoskeletal: Dorsal spinal stimulator.  Right iliac bone  island. IMPRESSION: 1. Decompressed, thick walled bladder again identified. Cannot exclude cystitis. 2. No other explanation for fever. 3. Large colonic stool burden, suggesting constipation and fecal impaction. This is unchanged. 4. 5 mm right lower lobe pulmonary nodule. No follow-up needed if patient is low-risk. Non-contrast chest CT can be considered in 12 months if patient is high-risk. This recommendation follows the consensus statement: Guidelines for Management of Incidental Pulmonary Nodules Detected on CT Images: From the Fleischner Society 2017; Radiology 2017; 284:228-243. 5. Probable thymic hyperplasia/rebound within the anterior mediastinum. Consider chest CT follow-up at 3-6 months to confirm stability or resolution. 6. Left renal lesion is favored to represent a minimally complex cyst, given size stability back to 2016. Electronically Signed   By: KAbigail MiyamotoM.D.   On: 08/19/2018 16:12    Pending Labs Unresulted Labs (From admission, onward)    Start     Ordered   08/19/18 1236  Culture, blood (Routine x 2)  BLOOD CULTURE X 2,   STAT  08/19/18 1235   08/19/18 1234  Urine C&S  Once,   STAT     08/19/18 1233   Signed and Held  Comprehensive metabolic panel  Tomorrow morning,   R     Signed and Held   Signed and Held  CBC  Tomorrow morning,   R     Signed and Held          Vitals/Pain Today's Vitals   08/19/18 1638 08/19/18 1649 08/19/18 1700 08/19/18 1704  BP:   (!) 105/58 (!) 116/49  Pulse:    (!) 115  Resp: (!) '22 20 13 20  ' Temp:    98.8 F (37.1 C)  TempSrc:    Oral  SpO2: 97% 97% 95% 95%  PainSc:        Isolation Precautions No active isolations  Medications Medications  iopamidol (ISOVUE-300) 61 % injection 100 mL (has no administration in time range)  iopamidol (ISOVUE-300) 61 % injection (has no administration in time range)  meropenem (MERREM) 1 g in sodium chloride 0.9 % 100 mL IVPB (has no administration in time range)  piperacillin-tazobactam  (ZOSYN) IVPB 3.375 g (0 g Intravenous Stopped 08/19/18 1427)  vancomycin (VANCOCIN) IVPB 1000 mg/200 mL premix (0 mg Intravenous Stopped 08/19/18 1630)  iopamidol (ISOVUE-300) 61 % injection 100 mL (100 mLs Intravenous Contrast Given 08/19/18 1457)    Mobility non-ambulatory

## 2018-08-19 NOTE — H&P (Signed)
History and Physical    Zamaria Brazzle UEA:[id number] DOB: 1976/10/21 DOA: 08/19/2018  I have briefly reviewed the patient's prior medical records in [facility]  PCP: Durenda Hurt, Flossie Buffy, MD  Patient coming from: SNF  Chief Complaint: "not acting right" fever  HPI: [name] [name] is a 42 y.o. female with medical history significant of cerebral palsy, hypertension, suprapubic catheter, significant contractures, prior PE on chronic Xarelto, recurrent UTIs including history of ESBL UTI who presents to the hospital with fever.  She was brought to the emergency room 3 days ago for weakness and sleepiness as well as increasing fatigue, and at that time a urinalysis showed evidence of a UTI and was placed on Bactrim and sent back to the nursing home.  Today patient was noted "not to be herself", was also reporting abdominal pain and had a fever, thus was brought back to the emergency room.  Patient tells me that she has a headache, feels hot as well as has increased muscle spasms compared to her normal.  Denies any abdominal pain, nausea or vomiting for me.  She denies any chest pain.  She reports fever and sweats.  ED Course: In the emergency room patient was found to have a fever of 100, was tachycardic with heart rates in the 1 teens, was also found to have an elevated lactic acid.  Urinalysis showed positive leuk esterase, nitrates, with few bacteria.  She was given broad-spectrum antibiotics with vancomycin and Zosyn and we are asked to admit.  Of note, her suprapubic catheter was changed by the ED physician  Review of Systems: As per HPI otherwise 10 point review of systems negative.   Past Medical History:  Diagnosis Date  . Abdominal pain 01/02/2012   Overview:  Overview:  Per Previous pcp note- Dr. Ta--Pt has history of endometriosis and had DUB in 2011.  She She had u/s that showed normal endometrial stripe.  She had endometrial bx that was wnl.  She has a lot of abd cramping every  month.  This cramping was sometimes not relieved with hydrocodone.  She was referred to [facility] for IUD placement to help endometriosis and abd cramping.  [name]  . Abdominal pain 01/02/2012   Overview:  Overview:  Per Previous pcp note- Dr. Ta--Pt has history of endometriosis and had DUB in 2011.  She She had u/s that showed normal endometrial stripe.  She had endometrial bx that was wnl.  She has a lot of abd cramping every month.  This cramping was sometimes not relieved with hydrocodone.  She was referred to [facility] for IUD placement to help endometriosis and abd cramping.  [name] was placed 04/30/11 by [facility].  Pt still complains of abd cramping, which I am treating with hydrocodone and ultram. Pt reports some initial relief of pain from IUD, but states it has now returned back to similar to previous cramping. Gave patient a shot of Toradol 60 mg IM today-since positive exacerbation of abdominal cramping during past 2 weeks. Physical exam reassuring. Patient may need to [facility] for further workup since she is plugged in with the GYN providers there for further treatment options regarding endometriosis and abdominal cramping. The dysfunctional uterine bleeding now well contr  . Acute cystitis with hematuria   . Acute cystitis without hematuria   . Acute respiratory failure with hypoxia ([name])   . Anemia   . Cellulitis 12/22/2014  . Cerebral palsy ([name])    Spastic Cerebral palsy, mentally intact  .  Contracture, joint, multiple sites    Electric wheelchair, uses left hand to operate chair.   . Depression   . Disease of female genital organs 10/24/2010   Overview:  Overview:  Pt has history of endometriosis and had DUB in 2011.  She had u/s that showed normal endometrial stripe.  She had endometrial bx that was wnl.  She has a lot of abd cramping every month.  This cramping was sometimes not relieved with hydrocodone.  She was referred to [facility] for IUD placement to help  endometriosis and abd cramping.  [name] was placed 04/30/11 by [facility].  Pt still complains of abd cramping, which was treated with hydrocodone and ultram.   Last seen at [facility] by OB/GYN 12/24/11, given Depo Lupron injection.  Ordering CT scan to r/o additional cause of abdominal pain (pt with known endometriosis and will not tolerate vaginal U/S, pain not improved s/p [name] or with Lupron injection-- only caused hot flashes and amenorrhea, no pain relief).  Also made referral to pain clinic for better pain control.  Do not think hysterectomy would help with pain, and concern for contamination of baclofen pump if did proceed with surgery.    8/5/13[name] OB/GYN: Pt was seen by   . [name]   . Dysarthria   . Dyspnea and respiratory abnormality 02/04/2011   Overview:  Overview:  Pt has history of recurrent PE and is on chronic coumadin.  Pt has been complaining of dyspnea that she describes as chest tightness.  She has been to the ER multiple times for this.  Usually she gets CTA and serial CE.  She has had at least 6 CTA in a period of since 2011.  She also has an O2 requirement. It is unclear why pt feels dyspneic, complains of chest tightness, or has O2 requirement.  I thought that her complaint may be from deconditioning and referred her to PT.  Unfortunately Medicaid only pays for 4 PT visits.  Pt should be doing these exercises at home, but she has not.  I have referred her to [facility], and she has been seen by [name].  He did not understand the etiology of her dyspnea and O2 requirement.  He will continue to follow her.   Last Assessment & Plan:  Pt presents with concerns for chest tightness and dyspnea.  She was seen in ED yesterday and discharged home.  While there she had a CT chest 04/20/11 showed 1.  Multifocal degradation as detailed above.  N  . Endometriosis   . GERD (gastroesophageal reflux disease)   . Gram positive sepsis ([name]) 10/24/2014  . Gross hematuria  03/12/2013   Overview:  Overview:  status post replacement of suprapubic tube 03/04/2013  Last Assessment & Plan:  Recent urine culture collected in ED on 5/4 did not result in significant growth. Moreover, patient has been asymptomatic with normal WBC, no fever and no true symptoms making symptomatic UTI less likely.  Will continue to monitor for any sign of infection.  Patient's xarelto had been held during her recent hospitalization due to hematuria. This has since been re-started. Monitor bleeding.    [name] Kitchen HCAP (healthcare-associated pneumonia) 10/25/2014  . HCAP (healthcare-associated pneumonia) 11/24/2017  . History of anticoagulant therapy 10/21/2012  . History of endometriosis 10/2012   OBGYN [facility]: Laparoscopic Endometrial Ablation, TVH,  . History of recurrent UTIs   . Hypertension   . HYPERTENSION, BENIGN 01/31/2010   Qualifier: Diagnosis of  By: [name], MD, [facility],  Bevelyn Buckles   . Incontinent of feces 03/11/2016  . Infantile cerebral palsy ([name]) 01/29/2007   Overview:  She has spastic CP.  She is mentally intact.  She is wheelchair bound.  She is wheelchair bound for ambulation      . Intracervical pessary 05/07/2011   Overview:  Overview:  Placed by [facility] 04/30/11 to treat DUB and Endometriosis.  She is seen at GYN clinic at [facility] but was considered a poor surgical candidate and referred her to [facility].  For DUB she had pelvic ultrasound that showed thin stripe and she had endometrial Bx by Dr Jennette Kettle in GYN clinic, which was negative.     . Macroscopic hematuria 03/10/2013   status post replacement of suprapubic tube 03/04/2013   . Migraine   . OCD (obsessive compulsive disorder)   . Parapneumonic effusion 10/25/2014  . Presence of intrathecal baclofen pump 03/07/2014   R LQ. Insertion T10.    [name] Kitchen Psychiatric illness 10/31/2014  . Pulmonary embolism ([name])    Lifetime Coumadin  . Pulmonary embolism ([name]) 2011, 01/2011   Will be on lifetime coumadin  . Recurrent pulmonary embolism ([name])  05/03/2011   Pt has history of recurrent PE and is on chronic coumadin.  Pt has been complaining of dyspnea that she describes as chest tightness.  She has been to the ER multiple times for this.  Usually she gets CTA and serial CE.  She has had at least 6 CTA in a period of since 2011.  She also has an O2 requirement. It is unclear why pt feels dyspneic, complains of chest tightness, or has O2 requirement.  I thought that her complaint may be from deconditioning and referred her to PT.  Unfortunately Medicaid only pays for 4 PT visits.  Pt should be doing these exercises at home, but she has not.  I have referred her to [facility], and she has been seen by [name].  He did not understand the etiology of her dyspnea and O2 requirement.  He will continue to follow her.     . Seborrheic keratosis 01/18/2011  . Seizures ([name]) 02/12/2016  . Spasticity 01/05/2014  . Transient alteration of awareness, recurrent 09/08/2006   Recurrent episodes where Jakaila  loses contact with her world and that have been studied thoroughly and appear nonepileptic in nature per Dr Terrace Arabia (neurology) The patient has had episodes starting in 2007 that initially began 1-4 times a day during which time she would have periods of unawareness followed by confusion. This continuous video EEG monitoring performed from October 8-12, 2007. Had   . Urethra dilated and patulous 07/03/2015   Patulous, Dilated Urethra. 5mL balloon on a 22-Fr catheter hoping this would prevent the bladder spasm from pushing the balloon down the urethra (Dr Logan Bores, Urology [facility], July 2016)     Past Surgical History:  Procedure Laterality Date  . ABDOMINAL HYSTERECTOMY    . APPENDECTOMY    . BACLOFEN PUMP REFILL     x 3 times  . CARPAL TUNNEL RELEASE  08/2008   Dr Teressa Senter  . CESAREAN SECTION     x 2  . CHOLECYSTECTOMY  10/14/2015   Procedure: LAPAROSCOPIC CHOLECYSTECTOMY;  Surgeon: Violeta Gelinas, MD;  Location: [facility] OR;  Service: General;;  . COLPOSCOPY  06/2000  .  INTRAUTERINE DEVICE INSERTION  04/30/11   Inserted by [facility] for endometriosis  . LAPAROSCOPIC ASSISTED VAGINAL HYSTERECTOMY  10/27/2012  . MULTIPLE EXTRACTIONS WITH ALVEOLOPLASTY Bilateral 07/19/2013  Procedure: EXTRACTIONS #4, [id number];  Surgeon: Francene Finders, DDS;  Location: [facility] OR;  Service: Oral Surgery;  Laterality: Bilateral;  . NEUROMA SURGERY Left    anterior and posterior intersosseous  . PAIN PUMP IMPLANTATION N/A 03/07/2014   Procedure: baclofen pump revision/replacement and Catheter connection replacement;  Surgeon: Cristi Loron, MD;  Location: [facility];  Service: Neurosurgery;  Laterality: N/A;  baclofen pump revision/replacement and Catheter connection replacement  . PROGRAMABLE BACLOFEN PUMP REVISION  03/17/14   Battery Replacement   . TUBAL LIGATION  2003  . URETHRA SURGERY    . WRIST SURGERY  06/2010   Dr Dierdre Searles, hand surgeon, [name]     reports that she has never smoked. She has never used smokeless tobacco. She reports that she drinks about 1.0 standard drinks of alcohol per week. She reports that she does not use drugs.  Allergies  Allergen Reactions  . Morphine Dermatitis and Other (See Comments)    Skin turned red    Family History  Problem Relation Age of Onset  . Asthma Father   . Colon cancer Maternal Grandmother        Died in her 21's  . Breast cancer Paternal Grandmother        Died in her 36's  . Heart attack Maternal Grandfather        Died in his 76's  . Alzheimer's disease Paternal Grandfather        Died in his 93's  . Breast cancer Mother   . Stomach cancer Neg Hx     Prior to Admission medications   Medication Sig Start Date End Date Taking? Authorizing Provider  acetaminophen (TYLENOL 8 HOUR) 650 MG CR tablet Take 1 tablet (650 mg total) by mouth every 8 (eight) hours as needed. Patient taking differently: Take 650 mg by mouth every 8 (eight) hours as needed for pain.  07/08/18  Yes Derwood Kaplan, MD  amantadine  (SYMMETREL) 100 MG capsule Take 100 mg by mouth daily.    Yes [provider]  baclofen (LIORESAL) 20 MG tablet Take 20 mg by mouth 4 (four) times daily. For bladder spasms   Yes [provider]  Buprenorphine HCl (BELBUCA) 450 MCG FILM Place 450 mg inside cheek every 12 (twelve) hours.   Yes [provider]  busPIRone (BUSPAR) 5 MG tablet Take 5 mg by mouth 3 (three) times daily.   Yes [provider]  clonazePAM (KLONOPIN) 0.5 MG tablet Take 1.5 tablets (0.75 mg total) by mouth 3 (three) times daily. 11/27/17  Yes Narda Bonds, MD  feeding supplement, ENSURE ENLIVE, (ENSURE ENLIVE) LIQD Take 237 mLs by mouth 2 (two) times daily between meals. 05/30/18  Yes Dow Adolph N, DO  ferrous sulfate 325 (65 FE) MG tablet Take 325 mg by mouth daily.    Yes [provider]  fluconazole (DIFLUCAN) 200 MG tablet Take 1 tablet (200 mg total) by mouth daily for 14 days. 08/16/18 08/30/18 Yes Harlene Salts A, PA-C  FLUoxetine (PROZAC) 40 MG capsule Take 80 mg by mouth daily.   Yes [provider]  lamoTRIgine (LAMICTAL) 100 MG tablet Take 100 mg by mouth at bedtime.   Yes [provider]  linaclotide (LINZESS) 145 MCG CAPS capsule Take 145 mcg by mouth daily before breakfast.   Yes [provider]  Melatonin 5 MG TABS Take 5 mg by mouth at bedtime.    Yes [provider]  Multiple Vitamins-Minerals (CERTA-VITE PO) Take 1 tablet by  mouth daily.   Yes [provider]  oxybutynin (DITROPAN) 5 MG tablet Take 5 mg by mouth 4 (four) times daily.  02/20/16  Yes [provider]  oxyCODONE (ROXICODONE) 5 MG immediate release tablet Take 5 mg by mouth every 4 (four) hours as needed for moderate pain or severe pain.   Yes [provider]  polyethylene glycol powder (GLYCOLAX/MIRALAX) powder Take 1  1/2 dose  daily in 12 ounces of fluid every day. Patient taking differently: Take 17 g by mouth 2 (two) times daily.   01/01/17  Yes Esterwood, [name] S, PA-C  QUEtiapine (SEROQUEL) 50 MG tablet Take 150 mg by mouth at bedtime.   Yes [provider]  rivaroxaban (XARELTO) 10 MG TABS tablet Take 10 mg by mouth daily.   Yes [provider]  sulfamethoxazole-trimethoprim (BACTRIM DS,SEPTRA DS) 800-160 MG tablet Take 1 tablet by mouth 2 (two) times daily for 7 days. 08/16/18 08/23/18 Yes Harlene SaltsMorelli, Brandon A, PA-C  tiZANidine (ZANAFLEX) 4 MG capsule Take 4 mg by mouth 2 (two) times daily.    Yes [provider]  topiramate (TOPAMAX) 25 MG tablet Take 3 tablets (75mg ) at bedtime for 2 weeks, then take 4 tablets (100mg ) at bedtime Patient taking differently: Take 75-100 mg by mouth See admin instructions. Take 75 mg by mouth at bedtime for 2 weeks until 08/21/18. Then start 100 mg by mouth at bedtime on 08/22/18 08/07/18  Yes Goodpasture, Inetta Fermoina, NP  bisacodyl (DULCOLAX) 10 MG suppository Place 10 mg rectally 2 (two) times daily as needed for moderate constipation.     [provider]  butalbital-acetaminophen-caffeine (FIORICET, ESGIC) 50-325-40 MG tablet Take 2 tablets by mouth every 4 (four) hours as needed for headache (not controlled by Tylenol). 05/29/18   Darlin DropHall, Carole N, DO  lactulose (CHRONULAC) 10 GM/15ML solution Take 20 g by mouth daily as needed for mild constipation or moderate constipation.    [provider]  lubiprostone (AMITIZA) 24 MCG capsule Take 1 capsule (24 mcg total) by mouth 2 (two) times daily with a meal. Patient not taking: Reported on 08/19/2018 01/01/17   Sammuel CooperEsterwood, [name] S, PA-C    Physical Exam: Vitals:   08/19/18 1603 08/19/18 1615 08/19/18 1626 08/19/18 1638  BP: 110/65 101/86 92/66   Pulse:      Resp:  12 14 (!) 22  Temp:      TempSrc:      SpO2: 95% 95% 97% 97%      Constitutional: No apparent distress, significant contractures upper and lower extremities Eyes: lids and conjunctivae normal ENMT: mmm Neck: normal, supple Respiratory: clear to  auscultation bilaterally, no wheezing, no crackles. Normal respiratory effort. No accessory muscle use.  Cardiovascular: Regular rate and rhythm, no murmurs / rubs / gallops. No extremity edema. 2+ pedal pulses.  Abdomen: no tenderness, no masses palpated.  Musculoskeletal: contractures present Skin: no rashes Neurologic: Nonfocal, upper and lower extremity contractures Psychiatric:  Alert and oriented x 3.  Labs on Admission: I have personally reviewed following labs and imaging studies  CBC: Recent Labs  Lab 08/16/18 1509 08/19/18 1256  WBC 5.4 6.1  NEUTROABS 2.5  --   HGB 12.8 13.2  HCT 39.4 39.9  MCV 93.6 92.8  PLT 216 223   Basic Metabolic Panel: Recent Labs  Lab 08/16/18 1509 08/19/18 1256  NA 141 145  K 4.2 4.5  CL 106 108  CO2 29 26  GLUCOSE 128* 110*  BUN 15 20  CREATININE 0.63 0.82  CALCIUM  8.8* 10.1   GFR: Estimated Creatinine Clearance: 61.5 mL/min (by C-G formula based on SCr of 0.82 mg/dL). Liver Function Tests: No results for input(s): AST, ALT, ALKPHOS, BILITOT, PROT, ALBUMIN in the last 168 hours. No results for input(s): LIPASE, AMYLASE in the last 168 hours. No results for input(s): AMMONIA in the last 168 hours. Coagulation Profile: Recent Labs  Lab 08/19/18 1256  INR 1.06   Cardiac Enzymes: No results for input(s): CKTOTAL, CKMB, CKMBINDEX, TROPONINI in the last 168 hours. BNP (last 3 results) No results for input(s): PROBNP in the last 8760 hours. HbA1C: No results for input(s): HGBA1C in the last 72 hours. CBG: No results for input(s): GLUCAP in the last 168 hours. Lipid Profile: No results for input(s): CHOL, HDL, LDLCALC, TRIG, CHOLHDL, LDLDIRECT in the last 72 hours. Thyroid Function Tests: No results for input(s): TSH, T4TOTAL, FREET4, T3FREE, THYROIDAB in the last 72 hours. Anemia Panel: No results for input(s): VITAMINB12, FOLATE, FERRITIN, TIBC, IRON, RETICCTPCT in the last 72 hours. Urine analysis:    Component Value  Date/Time   COLORURINE YELLOW 08/19/2018 1241   APPEARANCEUR CLOUDY (A) 08/19/2018 1241   LABSPEC 1.018 08/19/2018 1241   PHURINE 6.0 08/19/2018 1241   GLUCOSEU NEGATIVE 08/19/2018 1241   HGBUR NEGATIVE 08/19/2018 1241   HGBUR large 10/03/2010 1530   BILIRUBINUR NEGATIVE 08/19/2018 1241   BILIRUBINUR neg 03/15/2011 1719   KETONESUR NEGATIVE 08/19/2018 1241   PROTEINUR 30 (A) 08/19/2018 1241   UROBILINOGEN 1.0 10/08/2015 2348   NITRITE POSITIVE (A) 08/19/2018 1241   LEUKOCYTESUR LARGE (A) 08/19/2018 1241   LEUKOCYTESUR mod 01/26/2014     Radiological Exams on Admission: Dg Chest 2 View  Result Date: 08/19/2018 CLINICAL DATA:  Urinary tract infection, fever and headache. History of cerebral palsy. EXAM: CHEST - 2 VIEW COMPARISON:  Chest radiograph August 16, 2018. FINDINGS: Cardiomediastinal silhouette is unremarkable for this low inspiratory examination with crowded vasculature markings. Stable RIGHT basilar strandy densities. Trachea projects midline and there is no pneumothorax. Included soft tissue planes and osseous structures are non-suspicious. Cervicothoracic hardware. IMPRESSION: 1. Stable RIGHT basilar atelectasis in this low inspiratory examination. Electronically Signed   By: Awilda Metro M.D.   On: 08/19/2018 14:09   Ct Chest W Contrast  Result Date: 08/19/2018 CLINICAL DATA:  Urinary tract infection. Fever and headache. Prior cholecystectomy. Prior hysterectomy. Cerebral palsy. Chronic suprapubic catheter. EXAM: CT CHEST, ABDOMEN, AND PELVIS WITH CONTRAST TECHNIQUE: Multidetector CT imaging of the chest, abdomen and pelvis was performed following the standard protocol during bolus administration of intravenous contrast. CONTRAST:  ISOVUE-300 IOPAMIDOL (ISOVUE-300) INJECTION 61% COMPARISON:  Chest radiograph 08/19/2018. Abdominopelvic CT 07/20/2018. Chest CT 10/27/2015. FINDINGS: CT CHEST FINDINGS Cardiovascular: Normal caliber of the aorta and branch vessels.  Normal heart size, without pericardial effusion. Mediastinum/Nodes: Left-sided thyroid enlargement. No mediastinal or hilar adenopathy. Soft tissue density in the anterior mediastinum is favored to represent thymic tissue at 1.5 cm on image 24/2. Lungs/Pleura: No pleural fluid. Right hemidiaphragm elevation. A 5 mm right lower lobe pulmonary nodule on image 82/6 is not readily apparent on priors, but may have been obscured by atelectasis in this region. No lobar consolidation. Musculoskeletal: Cervical and thoracic spine fixation. CT ABDOMEN PELVIS FINDINGS Hepatobiliary: Suspicion of mild hepatic steatosis. Cholecystectomy, without biliary ductal dilatation. Pancreas: Normal, without mass or ductal dilatation. Spleen: Normal in size, without focal abnormality. Adrenals/Urinary Tract: Normal adrenal glands. Upper pole left renal 1.3 cm lesion measures slightly greater than fluid density, similar in size on 10/27/2015. Normal  right kidney, without hydronephrosis. Suprapubic bladder catheter. The bladder is decompressed and thick walled. Similar. Stomach/Bowel: Normal stomach, without wall thickening. Marked colonic distention with stool throughout. This is similar to the most recent exam. Normal small bowel caliber. Vascular/Lymphatic: Normal caliber of the aorta and branch vessels. IVC filter, appropriately positioned. No abdominopelvic adenopathy. Reproductive: Hysterectomy.  No adnexal mass. Other: No significant free fluid. Musculoskeletal: Dorsal spinal stimulator.  Right iliac bone island. IMPRESSION: 1. Decompressed, thick walled bladder again identified. Cannot exclude cystitis. 2. No other explanation for fever. 3. Large colonic stool burden, suggesting constipation and fecal impaction. This is unchanged. 4. 5 mm right lower lobe pulmonary nodule. No follow-up needed if patient is low-risk. Non-contrast chest CT can be considered in 12 months if patient is high-risk. This recommendation follows the consensus  statement: Guidelines for Management of Incidental Pulmonary Nodules Detected on CT Images: From the Fleischner Society 2017; Radiology 2017; 284:228-243. 5. Probable thymic hyperplasia/rebound within the anterior mediastinum. Consider chest CT follow-up at 3-6 months to confirm stability or resolution. 6. Left renal lesion is favored to represent a minimally complex cyst, given size stability back to 2016. Electronically Signed   By: Jeronimo Greaves M.D.   On: 08/19/2018 16:12   Ct Abdomen Pelvis W Contrast  Result Date: 08/19/2018 CLINICAL DATA:  Urinary tract infection. Fever and headache. Prior cholecystectomy. Prior hysterectomy. Cerebral palsy. Chronic suprapubic catheter. EXAM: CT CHEST, ABDOMEN, AND PELVIS WITH CONTRAST TECHNIQUE: Multidetector CT imaging of the chest, abdomen and pelvis was performed following the standard protocol during bolus administration of intravenous contrast. CONTRAST:  ISOVUE-300 IOPAMIDOL (ISOVUE-300) INJECTION 61% COMPARISON:  Chest radiograph 08/19/2018. Abdominopelvic CT 07/20/2018. Chest CT 10/27/2015. FINDINGS: CT CHEST FINDINGS Cardiovascular: Normal caliber of the aorta and branch vessels. Normal heart size, without pericardial effusion. Mediastinum/Nodes: Left-sided thyroid enlargement. No mediastinal or hilar adenopathy. Soft tissue density in the anterior mediastinum is favored to represent thymic tissue at 1.5 cm on image 24/2. Lungs/Pleura: No pleural fluid. Right hemidiaphragm elevation. A 5 mm right lower lobe pulmonary nodule on image 82/6 is not readily apparent on priors, but may have been obscured by atelectasis in this region. No lobar consolidation. Musculoskeletal: Cervical and thoracic spine fixation. CT ABDOMEN PELVIS FINDINGS Hepatobiliary: Suspicion of mild hepatic steatosis. Cholecystectomy, without biliary ductal dilatation. Pancreas: Normal, without mass or ductal dilatation. Spleen: Normal in size, without focal abnormality. Adrenals/Urinary  Tract: Normal adrenal glands. Upper pole left renal 1.3 cm lesion measures slightly greater than fluid density, similar in size on 10/27/2015. Normal right kidney, without hydronephrosis. Suprapubic bladder catheter. The bladder is decompressed and thick walled. Similar. Stomach/Bowel: Normal stomach, without wall thickening. Marked colonic distention with stool throughout. This is similar to the most recent exam. Normal small bowel caliber. Vascular/Lymphatic: Normal caliber of the aorta and branch vessels. IVC filter, appropriately positioned. No abdominopelvic adenopathy. Reproductive: Hysterectomy.  No adnexal mass. Other: No significant free fluid. Musculoskeletal: Dorsal spinal stimulator.  Right iliac bone island. IMPRESSION: 1. Decompressed, thick walled bladder again identified. Cannot exclude cystitis. 2. No other explanation for fever. 3. Large colonic stool burden, suggesting constipation and fecal impaction. This is unchanged. 4. 5 mm right lower lobe pulmonary nodule. No follow-up needed if patient is low-risk. Non-contrast chest CT can be considered in 12 months if patient is high-risk. This recommendation follows the consensus statement: Guidelines for Management of Incidental Pulmonary Nodules Detected on CT Images: From the Fleischner Society 2017; Radiology 2017; 284:228-243. 5. Probable thymic hyperplasia/rebound within the anterior mediastinum. Consider  chest CT follow-up at 3-6 months to confirm stability or resolution. 6. Left renal lesion is favored to represent a minimally complex cyst, given size stability back to 2016. Electronically Signed   By: Jeronimo Greaves M.D.   On: 08/19/2018 16:12    EKG: Independently reviewed. Telemetry - sinus rhythm  Assessment/Plan Active Problems:   UTI (urinary tract infection)   Probable sepsis due to urinary tract infection -Given fever, tachycardia, reported mental status changes as well as lactic acidosis -Lactic acid improved after IV fluids,  continue hydration overnight -Start empirically on meropenem as patient has history of ESBL E. coli, but given prior administration of Bactrim cultures may not be of much benefit  Cerebral palsy with spastic quadriplegia, neurogenic bladder -Continue home regimen baclofen, Klonopin, oxybutynin, Zanaflex  Chronic pain -Patient appears to be both on buprenorphine as well as oxycodone, will hold oxycodone continue buprenorphine alone  Chronic constipation -Continue home medications  Anxiety/depression -Continue home regimen  Recurrent PE -Continue Xarelto   DVT prophylaxis: xarelto  Code Status: Full code  Family Communication: father over the phone Disposition Plan: SNF when ready Consults called: none     Admission status: inpatient    At the time of admission, it appears that the appropriate admission status for this patient is INPATIENT. This is judged to be reasonable and necessary in order to provide the required high service intensity to ensure the patient's safety given the presenting symptoms, physical exam findings, and initial radiographic and laboratory data in the context of their chronic comorbidities. Current circumstances are needs minimum of 48 h of IV antibiotics as she failed po Bactrim, and it is felt to place patient at high risk for further clinical deterioration threatening life, limb, or organ. Moreover, it is my clinical judgment that the patient will require inpatient hospital care spanning beyond 2 midnights from the point of admission and that early discharge would result in unnecessary risk of decompensation and readmission or threat to life, limb or bodily function.   Pamella Pert, MD [facility] Pager [phone]  If 7PM-7AM, please contact night-coverage [email or web address] Password [email or web address]  08/19/2018, 4:50 PM

## 2018-08-20 DIAGNOSIS — K219 Gastro-esophageal reflux disease without esophagitis: Secondary | ICD-10-CM

## 2018-08-20 DIAGNOSIS — I2699 Other pulmonary embolism without acute cor pulmonale: Secondary | ICD-10-CM

## 2018-08-20 DIAGNOSIS — G801 Spastic diplegic cerebral palsy: Secondary | ICD-10-CM

## 2018-08-20 DIAGNOSIS — F330 Major depressive disorder, recurrent, mild: Secondary | ICD-10-CM

## 2018-08-20 DIAGNOSIS — Z9359 Other cystostomy status: Secondary | ICD-10-CM

## 2018-08-20 DIAGNOSIS — G894 Chronic pain syndrome: Secondary | ICD-10-CM

## 2018-08-20 DIAGNOSIS — N3000 Acute cystitis without hematuria: Secondary | ICD-10-CM

## 2018-08-20 DIAGNOSIS — N319 Neuromuscular dysfunction of bladder, unspecified: Secondary | ICD-10-CM

## 2018-08-20 LAB — BLOOD CULTURE ID PANEL (REFLEXED)
Acinetobacter baumannii: NOT DETECTED
CANDIDA TROPICALIS: NOT DETECTED
Candida albicans: NOT DETECTED
Candida glabrata: NOT DETECTED
Candida krusei: NOT DETECTED
Candida parapsilosis: NOT DETECTED
Enterobacter cloacae complex: NOT DETECTED
Enterobacteriaceae species: NOT DETECTED
Enterococcus species: NOT DETECTED
Escherichia coli: NOT DETECTED
Haemophilus influenzae: NOT DETECTED
KLEBSIELLA PNEUMONIAE: NOT DETECTED
Klebsiella oxytoca: NOT DETECTED
Listeria monocytogenes: NOT DETECTED
METHICILLIN RESISTANCE: DETECTED — AB
NEISSERIA MENINGITIDIS: NOT DETECTED
PROTEUS SPECIES: NOT DETECTED
Pseudomonas aeruginosa: NOT DETECTED
STAPHYLOCOCCUS SPECIES: DETECTED — AB
STREPTOCOCCUS SPECIES: NOT DETECTED
Serratia marcescens: NOT DETECTED
Staphylococcus aureus (BCID): NOT DETECTED
Streptococcus agalactiae: NOT DETECTED
Streptococcus pneumoniae: NOT DETECTED
Streptococcus pyogenes: NOT DETECTED

## 2018-08-20 LAB — URINE CULTURE

## 2018-08-20 LAB — COMPREHENSIVE METABOLIC PANEL
ALK PHOS: 53 U/L (ref 38–126)
ALT: 13 U/L (ref 0–44)
AST: 21 U/L (ref 15–41)
Albumin: 3.6 g/dL (ref 3.5–5.0)
Anion gap: 8 (ref 5–15)
BUN: 16 mg/dL (ref 6–20)
CALCIUM: 8.5 mg/dL — AB (ref 8.9–10.3)
CO2: 22 mmol/L (ref 22–32)
CREATININE: 0.64 mg/dL (ref 0.44–1.00)
Chloride: 107 mmol/L (ref 98–111)
GFR calc non Af Amer: >60 mL/min (ref >60)
Glucose, Bld: 92 mg/dL (ref 70–99)
Potassium: 4 mmol/L (ref 3.5–5.1)
SODIUM: 137 mmol/L (ref 135–145)
Total Bilirubin: 0.7 mg/dL (ref 0.3–1.2)
Total Protein: 6.1 g/dL — ABNORMAL LOW (ref 6.5–8.1)

## 2018-08-20 LAB — CBC
HCT: 34.5 % — ABNORMAL LOW (ref 36.0–46.0)
Hemoglobin: 11.5 g/dL — ABNORMAL LOW (ref 12.0–15.0)
MCH: 30.7 pg (ref 26.0–34.0)
MCHC: 33.3 g/dL (ref 30.0–36.0)
MCV: 92 fL (ref 78.0–100.0)
PLATELETS: 194 10*3/uL (ref 150–400)
RBC: 3.75 MIL/uL — ABNORMAL LOW (ref 3.87–5.11)
RDW: 13.9 % (ref 11.5–15.5)
WBC: 5.7 10*3/uL (ref 4.0–10.5)

## 2018-08-20 LAB — MRSA PCR SCREENING: MRSA by PCR: NEGATIVE

## 2018-08-20 MED ORDER — ZOLPIDEM TARTRATE 5 MG PO TABS
5.0000 mg | ORAL_TABLET | Freq: Once | ORAL | Status: AC
  Administered 2018-08-20: 5 mg via ORAL
  Filled 2018-08-20: qty 1

## 2018-08-20 MED ORDER — DIAZEPAM 5 MG/ML IJ SOLN
5.0000 mg | Freq: Once | INTRAMUSCULAR | Status: AC
  Administered 2018-08-20: 5 mg via INTRAVENOUS
  Filled 2018-08-20: qty 2

## 2018-08-20 MED ORDER — OXYCODONE HCL 5 MG PO TABS
5.0000 mg | ORAL_TABLET | ORAL | Status: DC | PRN
  Administered 2018-08-20 – 2018-08-22 (×6): 5 mg via ORAL
  Filled 2018-08-20 (×6): qty 1

## 2018-08-20 NOTE — Progress Notes (Signed)
Pt BP low. MD notified. Will continue to monitor q2h VS.

## 2018-08-20 NOTE — Progress Notes (Signed)
PROGRESS NOTE    Mindi SlickerJudith Crocket  MVH:[id number] DOB: 08-Nov-1976 DOA: 08/19/2018 PCP: Justin MendArnaez Zapata, Gerardo E, MD   Brief Narrative:  42 year old female with history of cerebral palsy, suprapubic catheter, essential hypertension, significant contractures, history of pulmonary embolism on chronic Xarelto, recurrent urinary tract infections, history of ESBL UTI came to the hospital for evaluation of fever.  Apparently patient was here in the hospital ER 3 days ago with similar symptoms when she was diagnosed with urinary tract infection and discharged back to her nursing facility on Bactrim.  Despite of going back there she was not acting herself therefore sent to the hospital for further evaluation.  Here she was again diagnosed with urinary tract infection but given her history of ESBL she was started on IV fluids and meropenem.   Assessment & Plan:   Principal Problem:   UTI (urinary tract infection) Active Problems:   Major depressive disorder, recurrent episode ([name])   CP (cerebral palsy), spastic ([name])   Chronic suprapubic catheter ([name])   Recurrent pulmonary embolism ([name])   GERD (gastroesophageal reflux disease)   Chronic pain syndrome   Neurogenic bladder   Seizures ([name])  Sepsis secondary to urinary tract infection, improving - Her vital signs have slightly improved, blood pressure slightly soft this morning due to receiving multiple sedative medications due to her pain and spasticity but will keep a close eye out on this. -At this time empirically continue meropenem due to her history of ESBL E. coli. -Continue to provide supportive care, IV fluids as needed.  Encourage oral intake.  Cerebral palsy with spastic disorder Neurogenic bladder with suprapubic catheter Chronic pain with baclofen pump - Continue her home medication regimen at this time including baclofen, oxybutynin, Klonopin and Zanaflex.  Continue to provide supportive care.  History of anxiety and  depression -Continue home meds  Chronic constipation - Bowel regimen PRN  History of pulmonary embolisms in the past -Chronically on Xarelto.  Pharmacy to assist with dosage confirmation.  DVT prophylaxis: Xarelto Code Status: Full code Family Communication: None at bedside, called her father but didn't answer so left a voicemail.  Disposition Plan: Maintain inpatient stay for IV antibiotics and until further culture data is available.  Consultants:   None  Procedures:   None   Antimicrobials:   Meropenem 9/18>   Subjective: Reports of muscles spasms pain.  Slightly soft blood pressure this morning after receiving multiple pain medications and sedatives.  No new complaints otherwise.  Overall it somewhat difficult to understand her due to her speech difficulty.  Review of Systems Otherwise negative except as per HPI, including: General: Denies fever, chills, night sweats or unintended weight loss. Resp: Denies cough, wheezing, shortness of breath. Cardiac: Denies chest pain, palpitations, orthopnea, paroxysmal nocturnal dyspnea. GI: Denies abdominal pain, nausea, vomiting, diarrhea or constipation GU: Denies dysuria, frequency, hesitancy or incontinence MS: Denies muscle aches, joint pain or swelling Neuro: Denies headache, neurologic deficits (focal weakness, numbness, tingling), abnormal gait Psych: Denies anxiety, depression, SI/HI/AVH Skin: Denies new rashes or lesions ID: Denies sick contacts, exotic exposures, travel  Objective: Vitals:   08/19/18 2030 08/20/18 0524 08/20/18 1107 08/20/18 1109  BP: 102/77 (!) 97/48 (!) 86/60 90/64  Pulse: (!) 122 (!) 108 73 83  Resp: 19 20  11   Temp: 98.2 F (36.8 C) 98.1 F (36.7 C)  97.6 F (36.4 C)  TempSrc:      SpO2: 97% 96%  99%    Intake/Output Summary (Last 24 hours) at 08/20/2018 1211 Last data  filed at 08/20/2018 0855 Gross per 24 hour  Intake 480 ml  Output 400 ml  Net 80 ml   There were no vitals filed  for this visit.  Examination:  General exam: Appears calm and comfortable ; contracted body-upper and lower extremities.  Her speech is difficult to understand overall. Respiratory system: Clear to auscultation. Respiratory effort normal. Cardiovascular system: S1 & S2 heard, RRR. No JVD, murmurs, rubs, gallops or clicks. No pedal edema. Gastrointestinal system: Abdomen is nondistended, soft and nontender. No organomegaly or masses felt. Normal bowel sounds heard. Central nervous system: Alert and oriented.  No new focal deficits, speech is difficult to understand.  Contracted upper and lower extremities. Extremities: Contractures are of her upper and lower extremity Skin: No rashes, lesions or ulcers Psychiatry: Judgement and insight appear normal. Mood & affect appropriate.     Data Reviewed:   CBC: Recent Labs  Lab 08/16/18 1509 08/19/18 1256 08/20/18 0608  WBC 5.4 6.1 5.7  NEUTROABS 2.5  --   --   HGB 12.8 13.2 11.5*  HCT 39.4 39.9 34.5*  MCV 93.6 92.8 92.0  PLT 216 223 194   Basic Metabolic Panel: Recent Labs  Lab 08/16/18 1509 08/19/18 1256 08/20/18 0521  NA 141 145 137  K 4.2 4.5 4.0  CL 106 108 107  CO2 29 26 22   GLUCOSE 128* 110* 92  BUN 15 20 16   CREATININE 0.63 0.82 0.64  CALCIUM 8.8* 10.1 8.5*   GFR: Estimated Creatinine Clearance: 63 mL/min (by C-G formula based on SCr of 0.64 mg/dL). Liver Function Tests: Recent Labs  Lab 08/20/18 0521  AST 21  ALT 13  ALKPHOS 53  BILITOT 0.7  PROT 6.1*  ALBUMIN 3.6   No results for input(s): LIPASE, AMYLASE in the last 168 hours. No results for input(s): AMMONIA in the last 168 hours. Coagulation Profile: Recent Labs  Lab 08/19/18 1256  INR 1.06   Cardiac Enzymes: No results for input(s): CKTOTAL, CKMB, CKMBINDEX, TROPONINI in the last 168 hours. BNP (last 3 results) No results for input(s): PROBNP in the last 8760 hours. HbA1C: No results for input(s): HGBA1C in the last 72 hours. CBG: No  results for input(s): GLUCAP in the last 168 hours. Lipid Profile: No results for input(s): CHOL, HDL, LDLCALC, TRIG, CHOLHDL, LDLDIRECT in the last 72 hours. Thyroid Function Tests: No results for input(s): TSH, T4TOTAL, FREET4, T3FREE, THYROIDAB in the last 72 hours. Anemia Panel: No results for input(s): VITAMINB12, FOLATE, FERRITIN, TIBC, IRON, RETICCTPCT in the last 72 hours. Sepsis Labs: Recent Labs  Lab 08/19/18 1318 08/19/18 1607  LATICACIDVEN 3.91* 1.42    Recent Results (from the past 240 hour(s))  Urine culture     Status: Abnormal   Collection Time: 08/16/18  5:15 PM  Result Value Ref Range Status   Specimen Description   Final    URINE, CLEAN CATCH Performed at [facility], [address] [place], [place] [address]    Special Requests   Final    NONE Performed at [facility], [address], [place], [place] [address]    Culture MULTIPLE SPECIES PRESENT, SUGGEST RECOLLECTION (A)  Final   Report Status 08/17/2018 FINAL  Final  Culture, blood (Routine x 2)     Status: None (Preliminary result)   Collection Time: 08/19/18 12:56 PM  Result Value Ref Range Status   Specimen Description   Final    BLOOD BLOOD LEFT FOREARM Performed at [facility], [address] [place], [place] [address]    Special Requests   Final    BOTTLES DRAWN AEROBIC ONLY Blood Culture results may not be optimal due to an inadequate volume of blood received in culture bottles Performed at [facility], [address], [place], [place] [address]    Culture   Final    NO GROWTH < 12 HOURS Performed at [facility], [address], [place], [place] [address]    Report Status PENDING  Incomplete  Culture, blood (Routine x 2)     Status: None (Preliminary result)   Collection Time: 08/19/18 12:56 PM  Result Value Ref Range Status   Specimen Description   Final    BLOOD RIGHT ANTECUBITAL Performed at [facility], [address] [place], [place] [address]    Special Requests   Final    BOTTLES DRAWN AEROBIC AND ANAEROBIC Blood Culture adequate volume Performed at [facility], [address], [place], [place] [address]    Culture   Final    NO GROWTH < 12 HOURS Performed at [facility], [address] [place], [place] [address]    Report Status PENDING  Incomplete  MRSA PCR Screening     Status: None   Collection Time: 08/19/18  9:04 PM  Result Value Ref Range Status   MRSA by PCR NEGATIVE NEGATIVE Final    Comment:        The GeneXpert MRSA Assay (FDA approved for NASAL specimens only), is one component of a comprehensive MRSA colonization surveillance program. It is not intended to diagnose MRSA infection nor to guide or monitor treatment for MRSA infections. Performed at [facility], [address], [place], [place] [address]          Radiology Studies: Dg Chest 2 View  Result Date: 08/19/2018 CLINICAL DATA:  Urinary tract infection, fever and headache. History of cerebral palsy. EXAM: CHEST - 2 VIEW COMPARISON:  Chest radiograph August 16, 2018. FINDINGS: Cardiomediastinal silhouette is unremarkable for this low inspiratory examination with crowded vasculature markings. Stable RIGHT basilar strandy densities. Trachea projects midline and there is no pneumothorax. Included soft tissue planes and osseous structures are non-suspicious. Cervicothoracic hardware. IMPRESSION: 1. Stable RIGHT basilar atelectasis in this low inspiratory examination. Electronically Signed   By: Awilda Metro M.D.   On: 08/19/2018 14:09   Ct Chest W Contrast  Result Date: 08/19/2018 CLINICAL DATA:  Urinary tract infection. Fever and headache. Prior cholecystectomy. Prior hysterectomy. Cerebral palsy. Chronic suprapubic catheter. EXAM: CT CHEST, ABDOMEN, AND PELVIS WITH CONTRAST TECHNIQUE: Multidetector CT imaging of the chest,  abdomen and pelvis was performed following the standard protocol during bolus administration of intravenous contrast. CONTRAST:  ISOVUE-300 IOPAMIDOL (ISOVUE-300) INJECTION 61% COMPARISON:  Chest radiograph 08/19/2018. Abdominopelvic CT 07/20/2018. Chest CT 10/27/2015. FINDINGS: CT CHEST FINDINGS Cardiovascular: Normal caliber of the aorta and branch vessels. Normal heart size, without pericardial effusion. Mediastinum/Nodes: Left-sided thyroid enlargement. No mediastinal or hilar adenopathy. Soft tissue density in the anterior mediastinum is favored to represent thymic tissue at 1.5 cm on image 24/2. Lungs/Pleura: No pleural fluid. Right hemidiaphragm elevation. A 5 mm right lower lobe pulmonary nodule on image 82/6 is not readily apparent on priors, but may have been obscured by atelectasis in this region. No lobar consolidation. Musculoskeletal: Cervical and thoracic spine fixation. CT ABDOMEN PELVIS FINDINGS Hepatobiliary: Suspicion of mild hepatic steatosis. Cholecystectomy, without biliary ductal dilatation. Pancreas: Normal, without mass or ductal  dilatation. Spleen: Normal in size, without focal abnormality. Adrenals/Urinary Tract: Normal adrenal glands. Upper pole left renal 1.3 cm lesion measures slightly greater than fluid density, similar in size on 10/27/2015. Normal right kidney, without hydronephrosis. Suprapubic bladder catheter. The bladder is decompressed and thick walled. Similar. Stomach/Bowel: Normal stomach, without wall thickening. Marked colonic distention with stool throughout. This is similar to the most recent exam. Normal small bowel caliber. Vascular/Lymphatic: Normal caliber of the aorta and branch vessels. IVC filter, appropriately positioned. No abdominopelvic adenopathy. Reproductive: Hysterectomy.  No adnexal mass. Other: No significant free fluid. Musculoskeletal: Dorsal spinal stimulator.  Right iliac bone island. IMPRESSION: 1. Decompressed, thick walled bladder again  identified. Cannot exclude cystitis. 2. No other explanation for fever. 3. Large colonic stool burden, suggesting constipation and fecal impaction. This is unchanged. 4. 5 mm right lower lobe pulmonary nodule. No follow-up needed if patient is low-risk. Non-contrast chest CT can be considered in 12 months if patient is high-risk. This recommendation follows the consensus statement: Guidelines for Management of Incidental Pulmonary Nodules Detected on CT Images: From the Fleischner Society 2017; Radiology 2017; 284:228-243. 5. Probable thymic hyperplasia/rebound within the anterior mediastinum. Consider chest CT follow-up at 3-6 months to confirm stability or resolution. 6. Left renal lesion is favored to represent a minimally complex cyst, given size stability back to 2016. Electronically Signed   By: Jeronimo Greaves M.D.   On: 08/19/2018 16:12   Ct Abdomen Pelvis W Contrast  Result Date: 08/19/2018 CLINICAL DATA:  Urinary tract infection. Fever and headache. Prior cholecystectomy. Prior hysterectomy. Cerebral palsy. Chronic suprapubic catheter. EXAM: CT CHEST, ABDOMEN, AND PELVIS WITH CONTRAST TECHNIQUE: Multidetector CT imaging of the chest, abdomen and pelvis was performed following the standard protocol during bolus administration of intravenous contrast. CONTRAST:  ISOVUE-300 IOPAMIDOL (ISOVUE-300) INJECTION 61% COMPARISON:  Chest radiograph 08/19/2018. Abdominopelvic CT 07/20/2018. Chest CT 10/27/2015. FINDINGS: CT CHEST FINDINGS Cardiovascular: Normal caliber of the aorta and branch vessels. Normal heart size, without pericardial effusion. Mediastinum/Nodes: Left-sided thyroid enlargement. No mediastinal or hilar adenopathy. Soft tissue density in the anterior mediastinum is favored to represent thymic tissue at 1.5 cm on image 24/2. Lungs/Pleura: No pleural fluid. Right hemidiaphragm elevation. A 5 mm right lower lobe pulmonary nodule on image 82/6 is not readily apparent on priors, but may have been  obscured by atelectasis in this region. No lobar consolidation. Musculoskeletal: Cervical and thoracic spine fixation. CT ABDOMEN PELVIS FINDINGS Hepatobiliary: Suspicion of mild hepatic steatosis. Cholecystectomy, without biliary ductal dilatation. Pancreas: Normal, without mass or ductal dilatation. Spleen: Normal in size, without focal abnormality. Adrenals/Urinary Tract: Normal adrenal glands. Upper pole left renal 1.3 cm lesion measures slightly greater than fluid density, similar in size on 10/27/2015. Normal right kidney, without hydronephrosis. Suprapubic bladder catheter. The bladder is decompressed and thick walled. Similar. Stomach/Bowel: Normal stomach, without wall thickening. Marked colonic distention with stool throughout. This is similar to the most recent exam. Normal small bowel caliber. Vascular/Lymphatic: Normal caliber of the aorta and branch vessels. IVC filter, appropriately positioned. No abdominopelvic adenopathy. Reproductive: Hysterectomy.  No adnexal mass. Other: No significant free fluid. Musculoskeletal: Dorsal spinal stimulator.  Right iliac bone island. IMPRESSION: 1. Decompressed, thick walled bladder again identified. Cannot exclude cystitis. 2. No other explanation for fever. 3. Large colonic stool burden, suggesting constipation and fecal impaction. This is unchanged. 4. 5 mm right lower lobe pulmonary nodule. No follow-up needed if patient is low-risk. Non-contrast chest CT can be considered in 12 months if patient is high-risk. This recommendation  follows the consensus statement: Guidelines for Management of Incidental Pulmonary Nodules Detected on CT Images: From the Fleischner Society 2017; Radiology 2017; 284:228-243. 5. Probable thymic hyperplasia/rebound within the anterior mediastinum. Consider chest CT follow-up at 3-6 months to confirm stability or resolution. 6. Left renal lesion is favored to represent a minimally complex cyst, given size stability back to 2016.  Electronically Signed   By: Jeronimo Greaves M.D.   On: 08/19/2018 16:12        Scheduled Meds: . amantadine  100 mg Oral Daily  . baclofen  20 mg Oral QID  . busPIRone  5 mg Oral TID  . clonazePAM  0.75 mg Oral TID  . feeding supplement (ENSURE ENLIVE)  237 mL Oral BID BM  . ferrous sulfate  325 mg Oral Daily  . FLUoxetine  80 mg Oral Daily  . lamoTRIgine  100 mg Oral QHS  . linaclotide  145 mcg Oral QAC breakfast  . Melatonin  5 mg Oral QHS  . multivitamin  15 mL Oral Daily  . oxybutynin  5 mg Oral QID  . polyethylene glycol  17 g Oral BID  . QUEtiapine  150 mg Oral QHS  . rivaroxaban  10 mg Oral Daily  . tiZANidine  4 mg Oral BID  . [START ON 08/22/2018] topiramate  100 mg Oral QHS  . topiramate  75 mg Oral QHS   Continuous Infusions: . sodium chloride 75 mL/hr at 08/20/18 0940  . meropenem (MERREM) IV 1 g (08/20/18 0540)     LOS: 1 day   Time spent= 25 mins    Ankit Joline Maxcy, MD [facility] Pager [phone]   If 7PM-7AM, please contact night-coverage [email or web address] Password [email or web address] 08/20/2018, 12:11 PM

## 2018-08-20 NOTE — Progress Notes (Signed)
PHARMACY - PHYSICIAN COMMUNICATION CRITICAL VALUE ALERT - BLOOD CULTURE IDENTIFICATION (BCID)  [name] SlickerJudith [name] is an 42 y.o. female who [facility] with a chief complaint of fever  Assessment: methicillin-resistant coag-negative staph in 1/4 blood culture bottles (suspect contaminant)  Name of physician (or Provider) ContactedNelson Chimes: Amin  Current antibiotics: meropenem  Changes to prescribed antibiotics recommended: continue meropenem for UTI with Hx ESBL infection Recommendations accepted by provider   Results for orders placed or performed during the hospital encounter of 08/19/18  Blood Culture ID Panel (Reflexed) (Collected: 08/19/2018 12:56 PM)  Result Value Ref Range   Enterococcus species NOT DETECTED NOT DETECTED   Listeria monocytogenes NOT DETECTED NOT DETECTED   Staphylococcus species DETECTED (A) NOT DETECTED   Staphylococcus aureus NOT DETECTED NOT DETECTED   Methicillin resistance DETECTED (A) NOT DETECTED   Streptococcus species NOT DETECTED NOT DETECTED   Streptococcus agalactiae NOT DETECTED NOT DETECTED   Streptococcus pneumoniae NOT DETECTED NOT DETECTED   Streptococcus pyogenes NOT DETECTED NOT DETECTED   Acinetobacter baumannii NOT DETECTED NOT DETECTED   Enterobacteriaceae species NOT DETECTED NOT DETECTED   Enterobacter cloacae complex NOT DETECTED NOT DETECTED   Escherichia coli NOT DETECTED NOT DETECTED   Klebsiella oxytoca NOT DETECTED NOT DETECTED   Klebsiella pneumoniae NOT DETECTED NOT DETECTED   Proteus species NOT DETECTED NOT DETECTED   Serratia marcescens NOT DETECTED NOT DETECTED   Haemophilus influenzae NOT DETECTED NOT DETECTED   Neisseria meningitidis NOT DETECTED NOT DETECTED   Pseudomonas aeruginosa NOT DETECTED NOT DETECTED   Candida albicans NOT DETECTED NOT DETECTED   Candida glabrata NOT DETECTED NOT DETECTED   Candida krusei NOT DETECTED NOT DETECTED   Candida parapsilosis NOT DETECTED NOT DETECTED   Candida  tropicalis NOT DETECTED NOT DETECTED    Bernadene Personrew Johnmatthew Solorio, PharmD, BCPS [phone] 08/20/2018, 5:41 PM

## 2018-08-21 ENCOUNTER — Inpatient Hospital Stay: Payer: Self-pay

## 2018-08-21 MED ORDER — ONDANSETRON HCL 4 MG/2ML IJ SOLN
4.0000 mg | Freq: Four times a day (QID) | INTRAMUSCULAR | Status: DC | PRN
  Administered 2018-08-21 – 2018-08-22 (×2): 4 mg via INTRAVENOUS
  Filled 2018-08-21 (×2): qty 2

## 2018-08-21 MED ORDER — SODIUM CHLORIDE 0.9% FLUSH: 10.0000 mL | INTRAVENOUS | Status: DC | PRN

## 2018-08-21 MED ORDER — SODIUM CHLORIDE 0.9 % IV SOLN: 1.0000 g | Freq: Three times a day (TID) | INTRAVENOUS | 0 refills | Status: DC

## 2018-08-21 NOTE — Progress Notes (Signed)
PROGRESS NOTE    [name] [name]  ZOX:[id number] DOB: [birth year] DOA: 08/19/2018 PCP: Justin Mend, MD   Brief Narrative:  42 year old female with history of cerebral palsy, suprapubic catheter, essential hypertension, significant contractures, history of pulmonary embolism on chronic Xarelto, recurrent urinary tract infections, history of ESBL UTI came to the hospital for evaluation of fever.  Apparently patient was here in the hospital ER 3 days ago with similar symptoms when she was diagnosed with urinary tract infection and discharged back to her nursing facility on Bactrim.  Despite of going back there she was not acting herself therefore sent to the hospital for further evaluation.  Here she was again diagnosed with urinary tract infection but given her history of ESBL she was started on IV fluids and meropenem.   Assessment & Plan:   Principal Problem:   UTI (urinary tract infection) Active Problems:   Major depressive disorder, recurrent episode ([name])   CP (cerebral palsy), spastic ([name])   Chronic suprapubic catheter ([name])   Recurrent pulmonary embolism ([name])   GERD (gastroesophageal reflux disease)   Chronic pain syndrome   Neurogenic bladder   Seizures ([name])  Sepsis secondary to urinary tract infection, improving.  Possible ESBL - Her vital signs have slightly improved, blood pressure slightly soft this morning due to receiving multiple sedative medications due to her pain and spasticity but will keep a close eye out on this. -Urine culture shows multiple organisms therefore we collection has been advised.  Repeat urine culture ordered. -Spoke with Dr. Orvan Falconer 9/20.  Recommends treating with meropenem for total of 5 to 7 days. -Continue to provide supportive care, IV fluids as needed.  Encourage oral intake.  Cerebral palsy with spastic disorder Neurogenic bladder with suprapubic catheter Chronic pain with baclofen pump - Continue her home medication regimen at  this time including baclofen, oxybutynin, Klonopin and Zanaflex.  Continue to provide supportive care.  History of anxiety and depression -Continue home meds  Chronic constipation - Bowel regimen PRN  History of pulmonary embolisms in the past -Chronically on Xarelto.  Pharmacy to assist with dosage confirmation.  DVT prophylaxis: Xarelto Code Status: Full code Family Communication: spoke with the patient's father, [name].  Disposition Plan: Maintain inpatient stay for more IV meropenem and until more culture data is available.  Hopefully related to discharge her back to her facility in next 24 hours  Consultants:   None  Procedures:   None   Antimicrobials:   Meropenem 9/18>   Subjective: Patient doesn't have any new complaints.  She remains afebrile for now.  Review of Systems Otherwise negative except as per HPI, including: General = no fevers, chills, dizziness, malaise, fatigue HEENT/EYES = negative for pain, redness, loss of vision, double vision, blurred vision, loss of hearing, sore throat, hoarseness, dysphagia Cardiovascular= negative for chest pain, palpitation, murmurs, lower extremity swelling Respiratory/lungs= negative for shortness of breath, cough, hemoptysis, wheezing, mucus production Gastrointestinal= negative for nausea, vomiting,, abdominal pain, melena, hematemesis Genitourinary= negative for Dysuria, Hematuria, Change in Urinary Frequency MSK = Negative for arthralgia, myalgias, Back Pain, Joint swelling  Neurology= Negative for headache, seizures, numbness, tingling  Psychiatry= Negative for anxiety, depression, suicidal and homocidal ideation Allergy/Immunology= Medication/Food allergy as listed  Skin= Negative for Rash, lesions, ulcers, itching   Objective: Vitals:   08/20/18 1416 08/20/18 1955 08/20/18 2230 08/21/18 0440  BP: 97/74 (!) 99/53  90/64  Pulse: 78 90  65  Resp: 16 14  16   Temp: 98.2 F (36.8 C) 97.8 F (  36.6 C) 98.1 F (36.7  C) 98 F (36.7 C)  TempSrc: Oral Oral Oral   SpO2: 97% 99%  98%    Intake/Output Summary (Last 24 hours) at 08/21/2018 1218 Last data filed at 08/21/2018 1034 Gross per 24 hour  Intake 2358.96 ml  Output 1202 ml  Net 1156.96 ml   There were no vitals filed for this visit.  Examination: Constitutional: NAD, calm, comfortable, contracted body upper and lower extremity.  Speech is very difficult to understand given her cerebral palsy. Eyes: PERRL, lids and conjunctivae normal ENMT: Mucous membranes are moist. Posterior pharynx clear of any exudate or lesions.Normal dentition.  Neck: normal, supple, no masses, no thyromegaly Respiratory: clear to auscultation bilaterally, no wheezing, no crackles. Normal respiratory effort. No accessory muscle use.  Cardiovascular: Regular rate and rhythm, no murmurs / rubs / gallops. No extremity edema. 2+ pedal pulses. No carotid bruits.  Abdomen: no tenderness, no masses palpated. No hepatosplenomegaly. Bowel sounds positive.  Musculoskeletal: no clubbing / cyanosis. No joint deformity upper and lower extremities. Good ROM, no contractures. Normal muscle tone.  Skin: no rashes, lesions, ulcers. No induration Neurologic: Difficult to assess this but overall does not appear to have any new focal deficits.  She is awake and alert.  Contracted upper and lower extremities. Psychiatric: Normal judgment and insight. Alert and oriented x 3. Normal mood.  Suprapubic catheter in place.   Data Reviewed:   CBC: Recent Labs  Lab 08/16/18 1509 08/19/18 1256 08/20/18 0608  WBC 5.4 6.1 5.7  NEUTROABS 2.5  --   --   HGB 12.8 13.2 11.5*  HCT 39.4 39.9 34.5*  MCV 93.6 92.8 92.0  PLT 216 223 194   Basic Metabolic Panel: Recent Labs  Lab 08/16/18 1509 08/19/18 1256 08/20/18 0521  NA 141 145 137  K 4.2 4.5 4.0  CL 106 108 107  CO2 29 26 22   GLUCOSE 128* 110* 92  BUN 15 20 16   CREATININE 0.63 0.82 0.64  CALCIUM 8.8* 10.1 8.5*   GFR: Estimated  Creatinine Clearance: 63 mL/min (by C-G formula based on SCr of 0.64 mg/dL). Liver Function Tests: Recent Labs  Lab 08/20/18 0521  AST 21  ALT 13  ALKPHOS 53  BILITOT 0.7  PROT 6.1*  ALBUMIN 3.6   No results for input(s): LIPASE, AMYLASE in the last 168 hours. No results for input(s): AMMONIA in the last 168 hours. Coagulation Profile: Recent Labs  Lab 08/19/18 1256  INR 1.06   Cardiac Enzymes: No results for input(s): CKTOTAL, CKMB, CKMBINDEX, TROPONINI in the last 168 hours. BNP (last 3 results) No results for input(s): PROBNP in the last 8760 hours. HbA1C: No results for input(s): HGBA1C in the last 72 hours. CBG: No results for input(s): GLUCAP in the last 168 hours. Lipid Profile: No results for input(s): CHOL, HDL, LDLCALC, TRIG, CHOLHDL, LDLDIRECT in the last 72 hours. Thyroid Function Tests: No results for input(s): TSH, T4TOTAL, FREET4, T3FREE, THYROIDAB in the last 72 hours. Anemia Panel: No results for input(s): VITAMINB12, FOLATE, FERRITIN, TIBC, IRON, RETICCTPCT in the last 72 hours. Sepsis Labs: Recent Labs  Lab 08/19/18 1318 08/19/18 1607  LATICACIDVEN 3.91* 1.42    Recent Results (from the past 240 hour(s))  Urine culture     Status: Abnormal   Collection Time: 08/16/18  5:15 PM  Result Value Ref Range Status   Specimen Description   Final    URINE, CLEAN CATCH Performed at [facility], [address] [place], [place]  [address]    Special Requests   Final    NONE Performed at [facility], [address], [place], [place] [address]    Culture MULTIPLE SPECIES PRESENT, SUGGEST RECOLLECTION (A)  Final   Report Status 08/17/2018 FINAL  Final  Urine C&S     Status: Abnormal   Collection Time: 08/19/18 12:56 PM  Result Value Ref Range Status   Specimen Description   Final    URINE, CLEAN CATCH Performed at [facility], [address], [place], [place] [address]    Special Requests    Final    NONE Performed at [facility], [address] [place], [place] [address]    Culture MULTIPLE SPECIES PRESENT, SUGGEST RECOLLECTION (A)  Final   Report Status 08/20/2018 FINAL  Final  Culture, blood (Routine x 2)     Status: None (Preliminary result)   Collection Time: 08/19/18 12:56 PM  Result Value Ref Range Status   Specimen Description   Final    BLOOD BLOOD LEFT FOREARM Performed at [facility], [address], [place], [place] [address]    Special Requests   Final    BOTTLES DRAWN AEROBIC ONLY Blood Culture results may not be optimal due to an inadequate volume of blood received in culture bottles Performed at [facility], [address] [place], [place] [address]    Culture   Final    NO GROWTH 2 DAYS Performed at [facility], [address] [place], [place] [address]    Report Status PENDING  Incomplete  Culture, blood (Routine x 2)     Status: None (Preliminary result)   Collection Time: 08/19/18 12:56 PM  Result Value Ref Range Status   Specimen Description   Final    BLOOD RIGHT ANTECUBITAL Performed at [facility], [address], [place], [place] [address]    Special Requests   Final    BOTTLES DRAWN AEROBIC AND ANAEROBIC Blood Culture adequate volume Performed at [facility], [address], [place], [place] [address]    Culture  Setup Time   Final    GRAM POSITIVE COCCI AEROBIC BOTTLE ONLY Organism ID to follow CRITICAL RESULT CALLED TO, READ BACK BY AND VERIFIED WITH: WOFFORD PHARMD AT 1500 ON [id number] BY SJW Performed at [facility], [address], [place], [place] [address]    Culture GRAM POSITIVE COCCI  Final   Report Status PENDING  Incomplete  Blood Culture ID Panel (Reflexed)     Status: Abnormal   Collection Time: 08/19/18 12:56 PM  Result Value Ref Range Status   Enterococcus species NOT DETECTED NOT DETECTED Final    Listeria monocytogenes NOT DETECTED NOT DETECTED Final   Staphylococcus species DETECTED (A) NOT DETECTED Final    Comment: Methicillin (oxacillin) resistant coagulase negative staphylococcus. Possible blood culture contaminant (unless isolated from more than one blood culture draw or clinical case suggests pathogenicity). No antibiotic treatment is indicated for blood  culture contaminants. CRITICAL RESULT CALLED TO, READ BACK BY AND VERIFIED WITH: WOFFORD PHARMD AT 1500 ON [id number] BY SJW    Staphylococcus aureus NOT DETECTED NOT DETECTED Final   Methicillin resistance DETECTED (A) NOT DETECTED Final    Comment: CRITICAL RESULT CALLED TO, READ BACK BY AND VERIFIED WITH: WOFFORD PHARMD AT 1500 ON [id number] BY SJW    Streptococcus species NOT DETECTED NOT DETECTED Final   Streptococcus agalactiae NOT DETECTED NOT DETECTED Final  Streptococcus pneumoniae NOT DETECTED NOT DETECTED Final   Streptococcus pyogenes NOT DETECTED NOT DETECTED Final   Acinetobacter baumannii NOT DETECTED NOT DETECTED Final   Enterobacteriaceae species NOT DETECTED NOT DETECTED Final   Enterobacter cloacae complex NOT DETECTED NOT DETECTED Final   Escherichia coli NOT DETECTED NOT DETECTED Final   Klebsiella oxytoca NOT DETECTED NOT DETECTED Final   Klebsiella pneumoniae NOT DETECTED NOT DETECTED Final   Proteus species NOT DETECTED NOT DETECTED Final   Serratia marcescens NOT DETECTED NOT DETECTED Final   Haemophilus influenzae NOT DETECTED NOT DETECTED Final   Neisseria meningitidis NOT DETECTED NOT DETECTED Final   Pseudomonas aeruginosa NOT DETECTED NOT DETECTED Final   Candida albicans NOT DETECTED NOT DETECTED Final   Candida glabrata NOT DETECTED NOT DETECTED Final   Candida krusei NOT DETECTED NOT DETECTED Final   Candida parapsilosis NOT DETECTED NOT DETECTED Final   Candida tropicalis NOT DETECTED NOT DETECTED Final    Comment: Performed at [facility], [address], [place], [place] [address]    MRSA PCR Screening     Status: None   Collection Time: 08/19/18  9:04 PM  Result Value Ref Range Status   MRSA by PCR NEGATIVE NEGATIVE Final    Comment:        The GeneXpert MRSA Assay (FDA approved for NASAL specimens only), is one component of a comprehensive MRSA colonization surveillance program. It is not intended to diagnose MRSA infection nor to guide or monitor treatment for MRSA infections. Performed at [facility], [address], [place], [place] [address]          Radiology Studies: Dg Chest 2 View  Result Date: 08/19/2018 CLINICAL DATA:  Urinary tract infection, fever and headache. History of cerebral palsy. EXAM: CHEST - 2 VIEW COMPARISON:  Chest radiograph August 16, 2018. FINDINGS: Cardiomediastinal silhouette is unremarkable for this low inspiratory examination with crowded vasculature markings. Stable RIGHT basilar strandy densities. Trachea projects midline and there is no pneumothorax. Included soft tissue planes and osseous structures are non-suspicious. Cervicothoracic hardware. IMPRESSION: 1. Stable RIGHT basilar atelectasis in this low inspiratory examination. Electronically Signed   By: Awilda Metro M.D.   On: 08/19/2018 14:09   Ct Chest W Contrast  Result Date: 08/19/2018 CLINICAL DATA:  Urinary tract infection. Fever and headache. Prior cholecystectomy. Prior hysterectomy. Cerebral palsy. Chronic suprapubic catheter. EXAM: CT CHEST, ABDOMEN, AND PELVIS WITH CONTRAST TECHNIQUE: Multidetector CT imaging of the chest, abdomen and pelvis was performed following the standard protocol during bolus administration of intravenous contrast. CONTRAST:  ISOVUE-300 IOPAMIDOL (ISOVUE-300) INJECTION 61% COMPARISON:  Chest radiograph 08/19/2018. Abdominopelvic CT 07/20/2018. Chest CT 10/27/2015. FINDINGS: CT CHEST FINDINGS Cardiovascular: Normal caliber of the aorta and branch vessels. Normal heart size, without pericardial effusion.  Mediastinum/Nodes: Left-sided thyroid enlargement. No mediastinal or hilar adenopathy. Soft tissue density in the anterior mediastinum is favored to represent thymic tissue at 1.5 cm on image 24/2. Lungs/Pleura: No pleural fluid. Right hemidiaphragm elevation. A 5 mm right lower lobe pulmonary nodule on image 82/6 is not readily apparent on priors, but may have been obscured by atelectasis in this region. No lobar consolidation. Musculoskeletal: Cervical and thoracic spine fixation. CT ABDOMEN PELVIS FINDINGS Hepatobiliary: Suspicion of mild hepatic steatosis. Cholecystectomy, without biliary ductal dilatation. Pancreas: Normal, without mass or ductal dilatation. Spleen: Normal in size, without focal abnormality. Adrenals/Urinary Tract: Normal adrenal glands. Upper pole left renal 1.3 cm lesion measures slightly greater than fluid density, similar in size on 10/27/2015.  Normal right kidney, without hydronephrosis. Suprapubic bladder catheter. The bladder is decompressed and thick walled. Similar. Stomach/Bowel: Normal stomach, without wall thickening. Marked colonic distention with stool throughout. This is similar to the most recent exam. Normal small bowel caliber. Vascular/Lymphatic: Normal caliber of the aorta and branch vessels. IVC filter, appropriately positioned. No abdominopelvic adenopathy. Reproductive: Hysterectomy.  No adnexal mass. Other: No significant free fluid. Musculoskeletal: Dorsal spinal stimulator.  Right iliac bone island. IMPRESSION: 1. Decompressed, thick walled bladder again identified. Cannot exclude cystitis. 2. No other explanation for fever. 3. Large colonic stool burden, suggesting constipation and fecal impaction. This is unchanged. 4. 5 mm right lower lobe pulmonary nodule. No follow-up needed if patient is low-risk. Non-contrast chest CT can be considered in 12 months if patient is high-risk. This recommendation follows the consensus statement: Guidelines for Management of  Incidental Pulmonary Nodules Detected on CT Images: From the Fleischner Society 2017; Radiology 2017; 284:228-243. 5. Probable thymic hyperplasia/rebound within the anterior mediastinum. Consider chest CT follow-up at 3-6 months to confirm stability or resolution. 6. Left renal lesion is favored to represent a minimally complex cyst, given size stability back to 2016. Electronically Signed   By: Jeronimo GreavesKyle  Talbot M.D.   On: 08/19/2018 16:12   Ct Abdomen Pelvis W Contrast  Result Date: 08/19/2018 CLINICAL DATA:  Urinary tract infection. Fever and headache. Prior cholecystectomy. Prior hysterectomy. Cerebral palsy. Chronic suprapubic catheter. EXAM: CT CHEST, ABDOMEN, AND PELVIS WITH CONTRAST TECHNIQUE: Multidetector CT imaging of the chest, abdomen and pelvis was performed following the standard protocol during bolus administration of intravenous contrast. CONTRAST:  100mL ISOVUE-300 IOPAMIDOL (ISOVUE-300) INJECTION 61% COMPARISON:  Chest radiograph 08/19/2018. Abdominopelvic CT 07/20/2018. Chest CT 10/27/2015. FINDINGS: CT CHEST FINDINGS Cardiovascular: Normal caliber of the aorta and branch vessels. Normal heart size, without pericardial effusion. Mediastinum/Nodes: Left-sided thyroid enlargement. No mediastinal or hilar adenopathy. Soft tissue density in the anterior mediastinum is favored to represent thymic tissue at 1.5 cm on image 24/2. Lungs/Pleura: No pleural fluid. Right hemidiaphragm elevation. A 5 mm right lower lobe pulmonary nodule on image 82/6 is not readily apparent on priors, but may have been obscured by atelectasis in this region. No lobar consolidation. Musculoskeletal: Cervical and thoracic spine fixation. CT ABDOMEN PELVIS FINDINGS Hepatobiliary: Suspicion of mild hepatic steatosis. Cholecystectomy, without biliary ductal dilatation. Pancreas: Normal, without mass or ductal dilatation. Spleen: Normal in size, without focal abnormality. Adrenals/Urinary Tract: Normal adrenal glands. Upper pole  left renal 1.3 cm lesion measures slightly greater than fluid density, similar in size on 10/27/2015. Normal right kidney, without hydronephrosis. Suprapubic bladder catheter. The bladder is decompressed and thick walled. Similar. Stomach/Bowel: Normal stomach, without wall thickening. Marked colonic distention with stool throughout. This is similar to the most recent exam. Normal small bowel caliber. Vascular/Lymphatic: Normal caliber of the aorta and branch vessels. IVC filter, appropriately positioned. No abdominopelvic adenopathy. Reproductive: Hysterectomy.  No adnexal mass. Other: No significant free fluid. Musculoskeletal: Dorsal spinal stimulator.  Right iliac bone island. IMPRESSION: 1. Decompressed, thick walled bladder again identified. Cannot exclude cystitis. 2. No other explanation for fever. 3. Large colonic stool burden, suggesting constipation and fecal impaction. This is unchanged. 4. 5 mm right lower lobe pulmonary nodule. No follow-up needed if patient is low-risk. Non-contrast chest CT can be considered in 12 months if patient is high-risk. This recommendation follows the consensus statement: Guidelines for Management of Incidental Pulmonary Nodules Detected on CT Images: From the Fleischner Society 2017; Radiology 2017; 284:228-243. 5. Probable thymic hyperplasia/rebound within the anterior mediastinum.  Consider chest CT follow-up at 3-6 months to confirm stability or resolution. 6. Left renal lesion is favored to represent a minimally complex cyst, given size stability back to 2016. Electronically Signed   By: Jeronimo Greaves M.D.   On: 08/19/2018 16:12        Scheduled Meds: . amantadine  100 mg Oral Daily  . baclofen  20 mg Oral QID  . busPIRone  5 mg Oral TID  . clonazePAM  0.75 mg Oral TID  . feeding supplement (ENSURE ENLIVE)  237 mL Oral BID BM  . ferrous sulfate  325 mg Oral Daily  . FLUoxetine  80 mg Oral Daily  . lamoTRIgine  100 mg Oral QHS  . linaclotide  145 mcg Oral  QAC breakfast  . Melatonin  5 mg Oral QHS  . multivitamin  15 mL Oral Daily  . oxybutynin  5 mg Oral QID  . polyethylene glycol  17 g Oral BID  . QUEtiapine  150 mg Oral QHS  . rivaroxaban  10 mg Oral Daily  . tiZANidine  4 mg Oral BID  . [START ON 08/22/2018] topiramate  100 mg Oral QHS  . topiramate  75 mg Oral QHS   Continuous Infusions: . meropenem (MERREM) IV 1 g (08/21/18 0626)     LOS: 2 days   Time spent= 25 mins    Ankit Joline Maxcy, MD [facility] Pager [phone]   If 7PM-7AM, please contact night-coverage [email or web address] Password [facility] 08/21/2018, 12:18 PM

## 2018-08-21 NOTE — Progress Notes (Signed)
Peripherally Inserted Central Catheter/Midline Placement  The IV Nurse has discussed with the patient and/or persons authorized to consent for the patient, the purpose of this procedure and the potential benefits and risks involved with this procedure.  The benefits include less needle sticks, lab draws from the catheter, and the patient may be discharged home with the catheter. Risks include, but not limited to, infection, bleeding, blood clot (thrombus formation), and puncture of an artery; nerve damage and irregular heartbeat and possibility to perform a PICC exchange if needed/ordered by physician.  Alternatives to this procedure were also discussed.  Bard Power PICC patient education guide, fact sheet on infection prevention and patient information card has been provided to patient /or left at bedside.  Patient's father was on phone while PICC information being explained to patient.  Patient gave verbal consent due to limited mobility.  PICC/Midline Placement Documentation  PICC Single Lumen 08/21/18 PICC Right Cephalic 33 cm 0 cm (Active)  Indication for Insertion or Continuance of Line Home intravenous therapies (PICC only) 08/21/2018  5:18 PM  Exposed Catheter (cm) 0 cm 08/21/2018  5:18 PM  Site Assessment Clean;Dry;Intact 08/21/2018  5:18 PM  Line Status Flushed;Saline locked;Blood return noted 08/21/2018  5:18 PM  Dressing Type Transparent 08/21/2018  5:18 PM  Dressing Status Clean;Dry;Intact 08/21/2018  5:18 PM  Dressing Intervention New dressing 08/21/2018  5:18 PM  Dressing Change Due 08/28/18 08/21/2018  5:18 PM       Joey Lierman, Lajean ManesKerry Loraine 08/21/2018, 5:19 PM

## 2018-08-21 NOTE — Clinical Social Work Note (Signed)
Clinical Social Work Assessment  Patient Details  Name: [name] SlickerJudith Locy MRN: [id number] Date of Birth: Jan 27, 1976  Date of referral:  08/21/18               Reason for consult:  Discharge Planning                Permission sought to share information with:    Permission granted to share information::     Name::        Agency::     Relationship::     Contact Information:     Housing/Transportation Living arrangements for the past 2 months:  Skilled Nursing Facility([facility]) Source of Information:  Parent(Dad - Rudell Cobbom Menser) Patient Interpreter Needed:  None Criminal Activity/Legal Involvement Pertinent to Current Situation/Hospitalization:  No - Comment as needed Significant Relationships:  Parents Lives with:  Facility Resident Do you feel safe going back to the place where you live?  Yes Need for family participation in patient care:  Yes (Comment)  Care giving concerns:  [facility]. Patient's RN reported that patient is total care.    Social Worker assessment / plan:  CSW spoke with patient's dad regarding discharge planning. CSW attempted to speak with patient regarding discharge planning, patient had impaired speech and CSW was not able to understand patient. Patient's dad reported that [facility] and the plan is for patient to return. Patient's dad confirmed that patient will need PTAR for transport.   [facility] and spoke with staff member Chantell, staff confirmed patient's ability to return.  CSW will complete FL2 and continue to follow and assist with discharge planning.   Employment status:  Disabled (Comment on whether or not currently receiving Disability) Insurance information:  [place], [place] Recommendations:  Not assessed at this time Information / Referral to community resources:  Other (Comment Required)([facility])  Patient/Family's  Response to care:  Patient's dad appreciative of CSW call.   Patient/Family's Understanding of and Emotional Response to Diagnosis, Current Treatment, and Prognosis:  Patient unable to participate in assessment. Patient's dad involved in patient's care and verbalized understanding of patient's current treatment plan. Patient's dad confirmed plan for patient to discharge back to SNF.   Emotional Assessment Appearance:    Attitude/Demeanor/Rapport:  Unable to Assess Affect (typically observed):  Unable to Assess Orientation:  Oriented to Self, Oriented to Place, Oriented to  Time, Oriented to Situation(Per chart review) Alcohol / Substance use:  Not Applicable Psych involvement (Current and /or in the community):  No (Comment)  Discharge Needs  Concerns to be addressed:  Care Coordination Readmission within the last 30 days:  No Current discharge risk:  None Barriers to Discharge:  Continued Medical Work up   USG CorporationKimberly L Asma Boldon, LCSW 08/21/2018, 1:48 PM

## 2018-08-22 DIAGNOSIS — R531 Weakness: Secondary | ICD-10-CM | POA: Diagnosis not present

## 2018-08-22 DIAGNOSIS — M24549 Contracture, unspecified hand: Secondary | ICD-10-CM | POA: Diagnosis not present

## 2018-08-22 DIAGNOSIS — M2450 Contracture, unspecified joint: Secondary | ICD-10-CM | POA: Diagnosis not present

## 2018-08-22 DIAGNOSIS — G249 Dystonia, unspecified: Secondary | ICD-10-CM | POA: Diagnosis not present

## 2018-08-22 DIAGNOSIS — R05 Cough: Secondary | ICD-10-CM | POA: Diagnosis not present

## 2018-08-22 DIAGNOSIS — R52 Pain, unspecified: Secondary | ICD-10-CM | POA: Diagnosis not present

## 2018-08-22 DIAGNOSIS — Z86711 Personal history of pulmonary embolism: Secondary | ICD-10-CM | POA: Diagnosis not present

## 2018-08-22 DIAGNOSIS — R252 Cramp and spasm: Secondary | ICD-10-CM | POA: Diagnosis not present

## 2018-08-22 DIAGNOSIS — N318 Other neuromuscular dysfunction of bladder: Secondary | ICD-10-CM | POA: Diagnosis not present

## 2018-08-22 DIAGNOSIS — G800 Spastic quadriplegic cerebral palsy: Secondary | ICD-10-CM | POA: Diagnosis not present

## 2018-08-22 DIAGNOSIS — F23 Brief psychotic disorder: Secondary | ICD-10-CM | POA: Diagnosis not present

## 2018-08-22 DIAGNOSIS — I959 Hypotension, unspecified: Secondary | ICD-10-CM | POA: Diagnosis not present

## 2018-08-22 DIAGNOSIS — H04123 Dry eye syndrome of bilateral lacrimal glands: Secondary | ICD-10-CM | POA: Diagnosis not present

## 2018-08-22 DIAGNOSIS — G4089 Other seizures: Secondary | ICD-10-CM | POA: Diagnosis not present

## 2018-08-22 DIAGNOSIS — E860 Dehydration: Secondary | ICD-10-CM | POA: Diagnosis not present

## 2018-08-22 DIAGNOSIS — K5900 Constipation, unspecified: Secondary | ICD-10-CM | POA: Diagnosis not present

## 2018-08-22 DIAGNOSIS — Z7401 Bed confinement status: Secondary | ICD-10-CM | POA: Diagnosis not present

## 2018-08-22 DIAGNOSIS — M419 Scoliosis, unspecified: Secondary | ICD-10-CM | POA: Diagnosis not present

## 2018-08-22 DIAGNOSIS — K5909 Other constipation: Secondary | ICD-10-CM | POA: Diagnosis not present

## 2018-08-22 DIAGNOSIS — M4322 Fusion of spine, cervical region: Secondary | ICD-10-CM | POA: Diagnosis not present

## 2018-08-22 DIAGNOSIS — R319 Hematuria, unspecified: Secondary | ICD-10-CM | POA: Diagnosis not present

## 2018-08-22 DIAGNOSIS — F4322 Adjustment disorder with anxiety: Secondary | ICD-10-CM | POA: Diagnosis not present

## 2018-08-22 DIAGNOSIS — Z993 Dependence on wheelchair: Secondary | ICD-10-CM | POA: Diagnosis not present

## 2018-08-22 DIAGNOSIS — R471 Dysarthria and anarthria: Secondary | ICD-10-CM | POA: Diagnosis not present

## 2018-08-22 DIAGNOSIS — R0989 Other specified symptoms and signs involving the circulatory and respiratory systems: Secondary | ICD-10-CM | POA: Diagnosis not present

## 2018-08-22 DIAGNOSIS — M7918 Myalgia, other site: Secondary | ICD-10-CM | POA: Diagnosis not present

## 2018-08-22 DIAGNOSIS — R079 Chest pain, unspecified: Secondary | ICD-10-CM | POA: Diagnosis not present

## 2018-08-22 DIAGNOSIS — N390 Urinary tract infection, site not specified: Secondary | ICD-10-CM | POA: Diagnosis not present

## 2018-08-22 DIAGNOSIS — R498 Other voice and resonance disorders: Secondary | ICD-10-CM | POA: Diagnosis not present

## 2018-08-22 DIAGNOSIS — R768 Other specified abnormal immunological findings in serum: Secondary | ICD-10-CM | POA: Diagnosis not present

## 2018-08-22 DIAGNOSIS — F39 Unspecified mood [affective] disorder: Secondary | ICD-10-CM | POA: Diagnosis not present

## 2018-08-22 DIAGNOSIS — G808 Other cerebral palsy: Secondary | ICD-10-CM | POA: Diagnosis not present

## 2018-08-22 DIAGNOSIS — F411 Generalized anxiety disorder: Secondary | ICD-10-CM | POA: Diagnosis not present

## 2018-08-22 DIAGNOSIS — R4781 Slurred speech: Secondary | ICD-10-CM | POA: Diagnosis not present

## 2018-08-22 DIAGNOSIS — G2581 Restless legs syndrome: Secondary | ICD-10-CM | POA: Diagnosis not present

## 2018-08-22 DIAGNOSIS — Z466 Encounter for fitting and adjustment of urinary device: Secondary | ICD-10-CM | POA: Diagnosis present

## 2018-08-22 DIAGNOSIS — Z79899 Other long term (current) drug therapy: Secondary | ICD-10-CM | POA: Diagnosis not present

## 2018-08-22 DIAGNOSIS — R5381 Other malaise: Secondary | ICD-10-CM | POA: Diagnosis not present

## 2018-08-22 DIAGNOSIS — G809 Cerebral palsy, unspecified: Secondary | ICD-10-CM | POA: Diagnosis not present

## 2018-08-22 DIAGNOSIS — K219 Gastro-esophageal reflux disease without esophagitis: Secondary | ICD-10-CM | POA: Diagnosis not present

## 2018-08-22 DIAGNOSIS — R5383 Other fatigue: Secondary | ICD-10-CM | POA: Diagnosis not present

## 2018-08-22 DIAGNOSIS — G8250 Quadriplegia, unspecified: Secondary | ICD-10-CM | POA: Diagnosis not present

## 2018-08-22 DIAGNOSIS — N319 Neuromuscular dysfunction of bladder, unspecified: Secondary | ICD-10-CM | POA: Diagnosis not present

## 2018-08-22 DIAGNOSIS — R911 Solitary pulmonary nodule: Secondary | ICD-10-CM | POA: Diagnosis not present

## 2018-08-22 DIAGNOSIS — R0689 Other abnormalities of breathing: Secondary | ICD-10-CM | POA: Diagnosis not present

## 2018-08-22 DIAGNOSIS — Z9359 Other cystostomy status: Secondary | ICD-10-CM | POA: Diagnosis not present

## 2018-08-22 DIAGNOSIS — I10 Essential (primary) hypertension: Secondary | ICD-10-CM | POA: Diagnosis not present

## 2018-08-22 DIAGNOSIS — Y733 Surgical instruments, materials and gastroenterology and urology devices (including sutures) associated with adverse incidents: Secondary | ICD-10-CM | POA: Diagnosis not present

## 2018-08-22 DIAGNOSIS — T839XXA Unspecified complication of genitourinary prosthetic device, implant and graft, initial encounter: Secondary | ICD-10-CM | POA: Diagnosis not present

## 2018-08-22 DIAGNOSIS — G894 Chronic pain syndrome: Secondary | ICD-10-CM | POA: Diagnosis not present

## 2018-08-22 DIAGNOSIS — M2550 Pain in unspecified joint: Secondary | ICD-10-CM | POA: Diagnosis not present

## 2018-08-22 DIAGNOSIS — Z593 Problems related to living in residential institution: Secondary | ICD-10-CM | POA: Diagnosis not present

## 2018-08-22 DIAGNOSIS — H53143 Visual discomfort, bilateral: Secondary | ICD-10-CM | POA: Diagnosis not present

## 2018-08-22 DIAGNOSIS — T83198A Other mechanical complication of other urinary devices and implants, initial encounter: Secondary | ICD-10-CM | POA: Diagnosis not present

## 2018-08-22 DIAGNOSIS — J9859 Other diseases of mediastinum, not elsewhere classified: Secondary | ICD-10-CM | POA: Diagnosis not present

## 2018-08-22 DIAGNOSIS — H539 Unspecified visual disturbance: Secondary | ICD-10-CM | POA: Diagnosis not present

## 2018-08-22 DIAGNOSIS — F331 Major depressive disorder, recurrent, moderate: Secondary | ICD-10-CM | POA: Diagnosis not present

## 2018-08-22 DIAGNOSIS — R000 Tachycardia, unspecified: Secondary | ICD-10-CM | POA: Diagnosis not present

## 2018-08-22 DIAGNOSIS — G801 Spastic diplegic cerebral palsy: Secondary | ICD-10-CM | POA: Diagnosis not present

## 2018-08-22 DIAGNOSIS — N3000 Acute cystitis without hematuria: Secondary | ICD-10-CM | POA: Diagnosis not present

## 2018-08-22 DIAGNOSIS — R4182 Altered mental status, unspecified: Secondary | ICD-10-CM | POA: Diagnosis not present

## 2018-08-22 DIAGNOSIS — M6240 Contracture of muscle, unspecified site: Secondary | ICD-10-CM | POA: Diagnosis not present

## 2018-08-22 DIAGNOSIS — G8929 Other chronic pain: Secondary | ICD-10-CM | POA: Diagnosis not present

## 2018-08-22 DIAGNOSIS — G4489 Other headache syndrome: Secondary | ICD-10-CM | POA: Diagnosis not present

## 2018-08-22 DIAGNOSIS — Z792 Long term (current) use of antibiotics: Secondary | ICD-10-CM | POA: Diagnosis not present

## 2018-08-22 DIAGNOSIS — R350 Frequency of micturition: Secondary | ICD-10-CM | POA: Diagnosis not present

## 2018-08-22 DIAGNOSIS — F418 Other specified anxiety disorders: Secondary | ICD-10-CM | POA: Diagnosis not present

## 2018-08-22 DIAGNOSIS — I2699 Other pulmonary embolism without acute cor pulmonale: Secondary | ICD-10-CM | POA: Diagnosis not present

## 2018-08-22 DIAGNOSIS — G43009 Migraine without aura, not intractable, without status migrainosus: Secondary | ICD-10-CM | POA: Diagnosis not present

## 2018-08-22 DIAGNOSIS — M542 Cervicalgia: Secondary | ICD-10-CM | POA: Diagnosis not present

## 2018-08-22 DIAGNOSIS — M359 Systemic involvement of connective tissue, unspecified: Secondary | ICD-10-CM | POA: Diagnosis not present

## 2018-08-22 DIAGNOSIS — R51 Headache: Secondary | ICD-10-CM | POA: Diagnosis not present

## 2018-08-22 DIAGNOSIS — G803 Athetoid cerebral palsy: Secondary | ICD-10-CM | POA: Diagnosis not present

## 2018-08-22 DIAGNOSIS — R3915 Urgency of urination: Secondary | ICD-10-CM | POA: Diagnosis not present

## 2018-08-22 DIAGNOSIS — T83091A Other mechanical complication of indwelling urethral catheter, initial encounter: Secondary | ICD-10-CM | POA: Diagnosis not present

## 2018-08-22 DIAGNOSIS — Z7901 Long term (current) use of anticoagulants: Secondary | ICD-10-CM | POA: Diagnosis not present

## 2018-08-22 DIAGNOSIS — M6281 Muscle weakness (generalized): Secondary | ICD-10-CM | POA: Diagnosis not present

## 2018-08-22 DIAGNOSIS — R532 Functional quadriplegia: Secondary | ICD-10-CM | POA: Diagnosis not present

## 2018-08-22 LAB — CULTURE, BLOOD (ROUTINE X 2): Special Requests: ADEQUATE

## 2018-08-22 LAB — URINE CULTURE: Culture: NO GROWTH

## 2018-08-22 MED ORDER — BUPRENORPHINE HCL 450 MCG BU FILM: 450.0000 ug | ORAL_FILM | Freq: Two times a day (BID) | BUCCAL | 0 refills | Status: DC

## 2018-08-22 NOTE — Discharge Summary (Signed)
Physician Discharge Summary  [name] [name] ZOX:[id number] DOB: 01-06-1976 DOA: 08/19/2018  PCP: Justin Mend, MD  Admit date: 08/19/2018 Discharge date: 08/22/2018  Admitted From: [facility] Disposition: Nursing facility  Recommendations for Outpatient Follow-up:  1. Follow up with PCP in 1-2 weeks 2. Please obtain BMP/CBC in one week your next doctors visit.  3. Meropenem IV 1 g every 8 hours for 3 days 4. Patient is on buprenorphine 450 mcg film every 12 hours (not 450 mg).  Caution with the units.  5. Remove PICC line after antibiotic course is done   Discharge Condition: Stable CODE STATUS: Full code Diet recommendation: Regular  Brief/Interim Summary: 42 year old female with history of cerebral palsy, suprapubic catheter, essential hypertension, significant contractures, history of pulmonary embolism on chronic Xarelto, recurrent urinary tract infections, history of ESBL UTI came to the hospital for evaluation of fever.  Apparently patient was here in the hospital ER 3 days ago with similar symptoms when she was diagnosed with urinary tract infection and discharged back to her nursing facility on Bactrim.  Despite of going back there she was not acting herself therefore sent to the hospital for further evaluation.  Here she was again diagnosed with urinary tract infection but given her history of ESBL she was started on IV fluids and meropenem.   Over the couple of days patient's symptoms improved and she was mentating well.  I discussed the case with Dr. Orvan Falconer over the phone who recommended treating meropenem IV for total of 5 to 7 days otherwise no further intervention.  Blood cultures are likely contaminant.  She remains afebrile.  Nursing home would not allow patient to get IV medications without PICC line therefore a PICC line was placed which can be removed after the course of antibiotic is done. Today she is reached maximum benefit from hospital stay  and can be discharged  Discharge Diagnoses:  Principal Problem:   UTI (urinary tract infection) Active Problems:   Major depressive disorder, recurrent episode ([name])   CP (cerebral palsy), spastic ([name])   Chronic suprapubic catheter ([name])   Recurrent pulmonary embolism ([name])   GERD (gastroesophageal reflux disease)   Chronic pain syndrome   Neurogenic bladder   Seizures ([name])    Sepsis secondary to urinary tract infection, improving.  Possible ESBL, improved -  Her vital signs remained stable.  She remains afebrile at this time.  Blood cultures likely contaminated.  Previously urine culture has grown ESBL.  Case was discussed by me with Dr. Orvan Falconer on 9/20 recommended treating with meropenem for total of 5 to 7 days.  We will discharge her on 3 more days of IV meropenem.  Because nursing home would not allow IV pain medications via peripheral line, PICC line had to be placed which can be removed after the treatment course is done.  Cerebral palsy with spastic disorder Neurogenic bladder with suprapubic catheter Chronic pain with baclofen pump - Continue her home medication regimen at this time including baclofen, oxybutynin, Klonopin and Zanaflex  History of anxiety and depression -Continue home meds  Chronic constipation - Bowel regimen PRN  History of pulmonary embolisms in the past -Apparently her dose was reduced outpatient  I will defer back to her outpatient provider for dose adjustment as necessary for Xarelto.   Patient is on Xarelto Discharge the patient today back to her facility. Full Code   Discharge Instructions   Allergies as of 08/22/2018      Reactions   Morphine Dermatitis, Other (  See Comments)   Skin turned red      Medication List    TAKE these medications   acetaminophen 650 MG CR tablet Commonly known as:  TYLENOL Take 1 tablet (650 mg total) by mouth every 8 (eight) hours as needed. What changed:  reasons to take this   amantadine 100  MG capsule Commonly known as:  SYMMETREL Take 100 mg by mouth daily.   baclofen 20 MG tablet Commonly known as:  LIORESAL Take 20 mg by mouth 4 (four) times daily. For bladder spasms   bisacodyl 10 MG suppository Commonly known as:  DULCOLAX Place 10 mg rectally 2 (two) times daily as needed for moderate constipation.   Buprenorphine HCl 450 MCG Film Place 450 mcg inside cheek every 12 (twelve) hours. What changed:  how much to take   busPIRone 5 MG tablet Commonly known as:  BUSPAR Take 5 mg by mouth 3 (three) times daily.   butalbital-acetaminophen-caffeine 50-325-40 MG tablet Commonly known as:  FIORICET, ESGIC Take 2 tablets by mouth every 4 (four) hours as needed for headache (not controlled by Tylenol).   CERTA-VITE PO Take 1 tablet by mouth daily.   clonazePAM 0.5 MG tablet Commonly known as:  KLONOPIN Take 1.5 tablets (0.75 mg total) by mouth 3 (three) times daily.   feeding supplement (ENSURE ENLIVE) Liqd Take 237 mLs by mouth 2 (two) times daily between meals.   ferrous sulfate 325 (65 FE) MG tablet Take 325 mg by mouth daily.   fluconazole 200 MG tablet Commonly known as:  DIFLUCAN Take 1 tablet (200 mg total) by mouth daily for 14 days.   FLUoxetine 40 MG capsule Commonly known as:  PROZAC Take 80 mg by mouth daily.   lactulose 10 GM/15ML solution Commonly known as:  CHRONULAC Take 20 g by mouth daily as needed for mild constipation or moderate constipation.   lamoTRIgine 100 MG tablet Commonly known as:  LAMICTAL Take 100 mg by mouth at bedtime.   linaclotide 145 MCG Caps capsule Commonly known as:  LINZESS Take 145 mcg by mouth daily before breakfast.   lubiprostone 24 MCG capsule Commonly known as:  AMITIZA Take 1 capsule (24 mcg total) by mouth 2 (two) times daily with a meal.   Melatonin 5 MG Tabs Take 5 mg by mouth at bedtime.   meropenem 1 g in sodium chloride 0.9 % 100 mL Inject 1 g into the vein every 8 (eight) hours.    oxybutynin 5 MG tablet Commonly known as:  DITROPAN Take 5 mg by mouth 4 (four) times daily.   polyethylene glycol powder powder Commonly known as:  GLYCOLAX/MIRALAX Take 1  1/2 dose  daily in 12 ounces of fluid every day. What changed:    how much to take  how to take this  when to take this  additional instructions   QUEtiapine 50 MG tablet Commonly known as:  SEROQUEL Take 150 mg by mouth at bedtime.   rivaroxaban 10 MG Tabs tablet Commonly known as:  XARELTO Take 10 mg by mouth daily.   ROXICODONE 5 MG immediate release tablet Generic drug:  oxyCODONE Take 5 mg by mouth every 4 (four) hours as needed for moderate pain or severe pain.   sulfamethoxazole-trimethoprim 800-160 MG tablet Commonly known as:  BACTRIM DS,SEPTRA DS Take 1 tablet by mouth 2 (two) times daily for 7 days.   tiZANidine 4 MG capsule Commonly known as:  ZANAFLEX Take 4 mg by mouth 2 (two) times daily.  topiramate 25 MG tablet Commonly known as:  TOPAMAX Take 3 tablets (75mg ) at bedtime for 2 weeks, then take 4 tablets (100mg ) at bedtime What changed:    how much to take  how to take this  when to take this  additional instructions       Allergies  Allergen Reactions  . Morphine Dermatitis and Other (See Comments)    Skin turned red    You were cared for by a hospitalist during your hospital stay. If you have any questions about your discharge medications or the care you received while you were in the hospital after you are discharged, you can call the unit and asked to speak with the hospitalist on call if the hospitalist that took care of you is not available. Once you are discharged, your primary care physician will handle any further medical issues. Please note that no refills for any discharge medications will be authorized once you are discharged, as it is imperative that you return to your primary care physician (or establish a relationship with a primary care physician if you  do not have one) for your aftercare needs so that they can reassess your need for medications and monitor your lab values.  Consultations:  None, Curbsided ID, Dr Orvan Falconer on 9/20   Procedures/Studies: Dg Chest 2 View  Result Date: 08/19/2018 CLINICAL DATA:  Urinary tract infection, fever and headache. History of cerebral palsy. EXAM: CHEST - 2 VIEW COMPARISON:  Chest radiograph August 16, 2018. FINDINGS: Cardiomediastinal silhouette is unremarkable for this low inspiratory examination with crowded vasculature markings. Stable RIGHT basilar strandy densities. Trachea projects midline and there is no pneumothorax. Included soft tissue planes and osseous structures are non-suspicious. Cervicothoracic hardware. IMPRESSION: 1. Stable RIGHT basilar atelectasis in this low inspiratory examination. Electronically Signed   By: Awilda Metro M.D.   On: 08/19/2018 14:09   Dg Chest 2 View  Result Date: 08/16/2018 CLINICAL DATA:  Increased fatigue and cough EXAM: CHEST - 2 VIEW COMPARISON:  07/20/2018 FINDINGS: Cardiac shadow is stable. Postsurgical changes are noted. IVC filter is seen. The overall inspiratory effort is poor. Mild right basilar atelectasis is noted. No acute bony abnormality is seen. IMPRESSION: Mild right basilar atelectasis. Electronically Signed   By: Alcide Clever M.D.   On: 08/16/2018 15:52   Ct Chest W Contrast  Result Date: 08/19/2018 CLINICAL DATA:  Urinary tract infection. Fever and headache. Prior cholecystectomy. Prior hysterectomy. Cerebral palsy. Chronic suprapubic catheter. EXAM: CT CHEST, ABDOMEN, AND PELVIS WITH CONTRAST TECHNIQUE: Multidetector CT imaging of the chest, abdomen and pelvis was performed following the standard protocol during bolus administration of intravenous contrast. CONTRAST:  ISOVUE-300 IOPAMIDOL (ISOVUE-300) INJECTION 61% COMPARISON:  Chest radiograph 08/19/2018. Abdominopelvic CT 07/20/2018. Chest CT 10/27/2015. FINDINGS: CT CHEST FINDINGS  Cardiovascular: Normal caliber of the aorta and branch vessels. Normal heart size, without pericardial effusion. Mediastinum/Nodes: Left-sided thyroid enlargement. No mediastinal or hilar adenopathy. Soft tissue density in the anterior mediastinum is favored to represent thymic tissue at 1.5 cm on image 24/2. Lungs/Pleura: No pleural fluid. Right hemidiaphragm elevation. A 5 mm right lower lobe pulmonary nodule on image 82/6 is not readily apparent on priors, but may have been obscured by atelectasis in this region. No lobar consolidation. Musculoskeletal: Cervical and thoracic spine fixation. CT ABDOMEN PELVIS FINDINGS Hepatobiliary: Suspicion of mild hepatic steatosis. Cholecystectomy, without biliary ductal dilatation. Pancreas: Normal, without mass or ductal dilatation. Spleen: Normal in size, without focal abnormality. Adrenals/Urinary Tract: Normal adrenal glands. Upper pole left renal  1.3 cm lesion measures slightly greater than fluid density, similar in size on 10/27/2015. Normal right kidney, without hydronephrosis. Suprapubic bladder catheter. The bladder is decompressed and thick walled. Similar. Stomach/Bowel: Normal stomach, without wall thickening. Marked colonic distention with stool throughout. This is similar to the most recent exam. Normal small bowel caliber. Vascular/Lymphatic: Normal caliber of the aorta and branch vessels. IVC filter, appropriately positioned. No abdominopelvic adenopathy. Reproductive: Hysterectomy.  No adnexal mass. Other: No significant free fluid. Musculoskeletal: Dorsal spinal stimulator.  Right iliac bone island. IMPRESSION: 1. Decompressed, thick walled bladder again identified. Cannot exclude cystitis. 2. No other explanation for fever. 3. Large colonic stool burden, suggesting constipation and fecal impaction. This is unchanged. 4. 5 mm right lower lobe pulmonary nodule. No follow-up needed if patient is low-risk. Non-contrast chest CT can be considered in 12 months if  patient is high-risk. This recommendation follows the consensus statement: Guidelines for Management of Incidental Pulmonary Nodules Detected on CT Images: From the Fleischner Society 2017; Radiology 2017; 284:228-243. 5. Probable thymic hyperplasia/rebound within the anterior mediastinum. Consider chest CT follow-up at 3-6 months to confirm stability or resolution. 6. Left renal lesion is favored to represent a minimally complex cyst, given size stability back to 2016. Electronically Signed   By: Jeronimo GreavesKyle  Talbot M.D.   On: 08/19/2018 16:12   Ct Abdomen Pelvis W Contrast  Result Date: 08/19/2018 CLINICAL DATA:  Urinary tract infection. Fever and headache. Prior cholecystectomy. Prior hysterectomy. Cerebral palsy. Chronic suprapubic catheter. EXAM: CT CHEST, ABDOMEN, AND PELVIS WITH CONTRAST TECHNIQUE: Multidetector CT imaging of the chest, abdomen and pelvis was performed following the standard protocol during bolus administration of intravenous contrast. CONTRAST:  100mL ISOVUE-300 IOPAMIDOL (ISOVUE-300) INJECTION 61% COMPARISON:  Chest radiograph 08/19/2018. Abdominopelvic CT 07/20/2018. Chest CT 10/27/2015. FINDINGS: CT CHEST FINDINGS Cardiovascular: Normal caliber of the aorta and branch vessels. Normal heart size, without pericardial effusion. Mediastinum/Nodes: Left-sided thyroid enlargement. No mediastinal or hilar adenopathy. Soft tissue density in the anterior mediastinum is favored to represent thymic tissue at 1.5 cm on image 24/2. Lungs/Pleura: No pleural fluid. Right hemidiaphragm elevation. A 5 mm right lower lobe pulmonary nodule on image 82/6 is not readily apparent on priors, but may have been obscured by atelectasis in this region. No lobar consolidation. Musculoskeletal: Cervical and thoracic spine fixation. CT ABDOMEN PELVIS FINDINGS Hepatobiliary: Suspicion of mild hepatic steatosis. Cholecystectomy, without biliary ductal dilatation. Pancreas: Normal, without mass or ductal dilatation. Spleen:  Normal in size, without focal abnormality. Adrenals/Urinary Tract: Normal adrenal glands. Upper pole left renal 1.3 cm lesion measures slightly greater than fluid density, similar in size on 10/27/2015. Normal right kidney, without hydronephrosis. Suprapubic bladder catheter. The bladder is decompressed and thick walled. Similar. Stomach/Bowel: Normal stomach, without wall thickening. Marked colonic distention with stool throughout. This is similar to the most recent exam. Normal small bowel caliber. Vascular/Lymphatic: Normal caliber of the aorta and branch vessels. IVC filter, appropriately positioned. No abdominopelvic adenopathy. Reproductive: Hysterectomy.  No adnexal mass. Other: No significant free fluid. Musculoskeletal: Dorsal spinal stimulator.  Right iliac bone island. IMPRESSION: 1. Decompressed, thick walled bladder again identified. Cannot exclude cystitis. 2. No other explanation for fever. 3. Large colonic stool burden, suggesting constipation and fecal impaction. This is unchanged. 4. 5 mm right lower lobe pulmonary nodule. No follow-up needed if patient is low-risk. Non-contrast chest CT can be considered in 12 months if patient is high-risk. This recommendation follows the consensus statement: Guidelines for Management of Incidental Pulmonary Nodules Detected on CT Images: From the  Fleischner Society 2017; Radiology 2017; [phone]. 5. Probable thymic hyperplasia/rebound within the anterior mediastinum. Consider chest CT follow-up at 3-6 months to confirm stability or resolution. 6. Left renal lesion is favored to represent a minimally complex cyst, given size stability back to 2016. Electronically Signed   By: Jeronimo Greaves M.D.   On: 08/19/2018 16:12   [place] Ekg Site Rite  Result Date: 08/21/2018 If Site Rite image not attached, placement could not be confirmed due to current cardiac rhythm.     Subjective: Remains afebrile, no complaints.  Tolerating oral diet.  General = no fevers,  chills, dizziness, malaise, fatigue HEENT/EYES = negative for pain, redness, loss of vision, double vision, blurred vision, loss of hearing, sore throat, hoarseness, dysphagia Cardiovascular= negative for chest pain, palpitation, murmurs, lower extremity swelling Respiratory/lungs= negative for shortness of breath, cough, hemoptysis, wheezing, mucus production Gastrointestinal= negative for nausea, vomiting,, abdominal pain, melena, hematemesis Genitourinary= negative for Dysuria, Hematuria, Change in Urinary Frequency MSK = Negative for arthralgia, myalgias, Back Pain, Joint swelling  Neurology= Negative for headache, seizures, numbness, tingling  Psychiatry= Negative for anxiety, depression, suicidal and homocidal ideation Allergy/Immunology= Medication/Food allergy as listed  Skin= Negative for Rash, lesions, ulcers, itching   Discharge Exam: Vitals:   08/21/18 1900 08/22/18 0410  BP: 98/60 101/69  Pulse: 88 69  Resp: 18 18  Temp: 98.7 F (37.1 C)   SpO2: 98%    Vitals:   08/21/18 0440 08/21/18 1544 08/21/18 1900 08/22/18 0410  BP: 90/64 (!) 83/55 98/60 101/69  Pulse: 65 82 88 69  Resp: 16 18 18 18   Temp: 98 F (36.7 C) 98.3 F (36.8 C) 98.7 F (37.1 C)   TempSrc:  Oral Oral Oral  SpO2: 98% 99% 98%     General: Pt is alert, awake, not in acute distress, chronically ill-appearing with contracted extremities, poor dentition. Cardiovascular: RRR, S1/S2 +, no rubs, no gallops Respiratory: CTA bilaterally, no wheezing, no rhonchi Abdominal: Soft, NT, ND, bowel sounds + Extremities: Contracted extremities   The results of significant diagnostics from this hospitalization (including imaging, microbiology, ancillary and laboratory) are listed below for reference.     Microbiology: Recent Results (from the past 240 hour(s))  Urine culture     Status: Abnormal   Collection Time: 08/16/18  5:15 PM  Result Value Ref Range Status   Specimen Description   Final    URINE,  CLEAN CATCH Performed at [facility], [address], [place], [place] [address]    Special Requests   Final    NONE Performed at [facility], [address] [place], [place] [address]    Culture MULTIPLE SPECIES PRESENT, SUGGEST RECOLLECTION (A)  Final   Report Status 08/17/2018 FINAL  Final  Urine C&S     Status: Abnormal   Collection Time: 08/19/18 12:56 PM  Result Value Ref Range Status   Specimen Description   Final    URINE, CLEAN CATCH Performed at [facility], [address] [place], [place] [address]    Special Requests   Final    NONE Performed at [facility], [address], [place], [place] [address]    Culture MULTIPLE SPECIES PRESENT, SUGGEST RECOLLECTION (A)  Final   Report Status 08/20/2018 FINAL  Final  Culture, blood (Routine x 2)     Status: None (Preliminary result)   Collection Time: 08/19/18 12:56 PM  Result Value Ref Range Status   Specimen Description   Final  BLOOD BLOOD LEFT FOREARM Performed at [facility], [address] [place], [place] [address]    Special Requests   Final    BOTTLES DRAWN AEROBIC ONLY Blood Culture results may not be optimal due to an inadequate volume of blood received in culture bottles Performed at [facility], [address] [place], [place] [address]    Culture   Final    NO GROWTH 3 DAYS Performed at [facility], [address], [place], [place] [address]    Report Status PENDING  Incomplete  Culture, blood (Routine x 2)     Status: Abnormal   Collection Time: 08/19/18 12:56 PM  Result Value Ref Range Status   Specimen Description   Final    BLOOD RIGHT ANTECUBITAL Performed at [facility], [address] [place], [place] [address]    Special Requests   Final    BOTTLES DRAWN AEROBIC AND ANAEROBIC Blood Culture adequate volume Performed at [facility] [address] [place], [place] [address]    Culture  Setup Time   Final    GRAM POSITIVE COCCI AEROBIC BOTTLE ONLY CRITICAL RESULT CALLED TO, READ BACK BY AND VERIFIED WITH: WOFFORD PHARMD AT 1500 ON [id number] BY SJW    Culture (A)  Final    STAPHYLOCOCCUS SPECIES (COAGULASE NEGATIVE) THE SIGNIFICANCE OF ISOLATING THIS ORGANISM FROM A SINGLE SET OF BLOOD CULTURES WHEN MULTIPLE SETS ARE DRAWN IS UNCERTAIN. PLEASE NOTIFY THE MICROBIOLOGY DEPARTMENT WITHIN ONE WEEK IF SPECIATION AND SENSITIVITIES ARE REQUIRED. Performed at [facility], [address] [place], [place] [address]    Report Status 08/22/2018 FINAL  Final  Blood Culture ID Panel (Reflexed)     Status: Abnormal   Collection Time: 08/19/18 12:56 PM  Result Value Ref Range Status   Enterococcus species NOT DETECTED NOT DETECTED Final   Listeria monocytogenes NOT DETECTED NOT DETECTED Final   Staphylococcus species DETECTED (A) NOT DETECTED Final    Comment: Methicillin (oxacillin) resistant coagulase negative staphylococcus. Possible blood culture contaminant (unless isolated from more than one blood culture draw or clinical case suggests pathogenicity). No antibiotic treatment is indicated for blood  culture contaminants. CRITICAL RESULT CALLED TO, READ BACK BY AND VERIFIED WITH: WOFFORD PHARMD AT 1500 ON [id number] BY SJW    Staphylococcus aureus NOT DETECTED NOT DETECTED Final   Methicillin resistance DETECTED (A) NOT DETECTED Final    Comment: CRITICAL RESULT CALLED TO, READ BACK BY AND VERIFIED WITH: WOFFORD PHARMD AT 1500 ON [id number] BY SJW    Streptococcus species NOT DETECTED NOT DETECTED Final   Streptococcus agalactiae NOT DETECTED NOT DETECTED Final   Streptococcus pneumoniae NOT DETECTED NOT DETECTED Final   Streptococcus pyogenes NOT DETECTED NOT DETECTED Final   Acinetobacter baumannii NOT DETECTED NOT DETECTED Final   Enterobacteriaceae species NOT DETECTED NOT DETECTED Final   Enterobacter cloacae  complex NOT DETECTED NOT DETECTED Final   Escherichia coli NOT DETECTED NOT DETECTED Final   Klebsiella oxytoca NOT DETECTED NOT DETECTED Final   Klebsiella pneumoniae NOT DETECTED NOT DETECTED Final   Proteus species NOT DETECTED NOT DETECTED Final   Serratia marcescens NOT DETECTED NOT DETECTED Final   Haemophilus influenzae NOT DETECTED NOT DETECTED Final   Neisseria meningitidis NOT DETECTED NOT DETECTED Final   Pseudomonas aeruginosa NOT DETECTED NOT DETECTED Final   Candida albicans NOT DETECTED NOT DETECTED Final   Candida glabrata NOT DETECTED NOT DETECTED Final   Candida krusei NOT  DETECTED NOT DETECTED Final   Candida parapsilosis NOT DETECTED NOT DETECTED Final   Candida tropicalis NOT DETECTED NOT DETECTED Final    Comment: Performed at [facility], [address] [place], [place] [address]  MRSA PCR Screening     Status: None   Collection Time: 08/19/18  9:04 PM  Result Value Ref Range Status   MRSA by PCR NEGATIVE NEGATIVE Final    Comment:        The GeneXpert MRSA Assay (FDA approved for NASAL specimens only), is one component of a comprehensive MRSA colonization surveillance program. It is not intended to diagnose MRSA infection nor to guide or monitor treatment for MRSA infections. Performed at [facility], [address], [place], [place] [address]   Culture, Urine     Status: None   Collection Time: 08/21/18  7:53 AM  Result Value Ref Range Status   Specimen Description   Final    URINE, CATHETERIZED Performed at [facility], [address], [place], [place] [address]    Special Requests   Final    NONE Performed at [facility], [address], [place], [place] [address]    Culture   Final    NO GROWTH Performed at [facility], [address], [place], [place] [address]    Report Status 08/22/2018 FINAL  Final     Labs: BNP (last 3 results) No results for input(s): BNP in  the last 8760 hours. Basic Metabolic Panel: Recent Labs  Lab 08/16/18 1509 08/19/18 1256 08/20/18 0521  NA 141 145 137  K 4.2 4.5 4.0  CL 106 108 107  CO2 29 26 22   GLUCOSE 128* 110* 92  BUN 15 20 16   CREATININE 0.63 0.82 0.64  CALCIUM 8.8* 10.1 8.5*   Liver Function Tests: Recent Labs  Lab 08/20/18 0521  AST 21  ALT 13  ALKPHOS 53  BILITOT 0.7  PROT 6.1*  ALBUMIN 3.6   No results for input(s): LIPASE, AMYLASE in the last 168 hours. No results for input(s): AMMONIA in the last 168 hours. CBC: Recent Labs  Lab 08/16/18 1509 08/19/18 1256 08/20/18 0608  WBC 5.4 6.1 5.7  NEUTROABS 2.5  --   --   HGB 12.8 13.2 11.5*  HCT 39.4 39.9 34.5*  MCV 93.6 92.8 92.0  PLT 216 223 194   Cardiac Enzymes: No results for input(s): CKTOTAL, CKMB, CKMBINDEX, TROPONINI in the last 168 hours. BNP: Invalid input(s): POCBNP CBG: No results for input(s): GLUCAP in the last 168 hours. D-Dimer No results for input(s): DDIMER in the last 72 hours. Hgb A1c No results for input(s): HGBA1C in the last 72 hours. Lipid Profile No results for input(s): CHOL, HDL, LDLCALC, TRIG, CHOLHDL, LDLDIRECT in the last 72 hours. Thyroid function studies No results for input(s): TSH, T4TOTAL, T3FREE, THYROIDAB in the last 72 hours.  Invalid input(s): FREET3 Anemia work up No results for input(s): VITAMINB12, FOLATE, FERRITIN, TIBC, IRON, RETICCTPCT in the last 72 hours. Urinalysis    Component Value Date/Time   COLORURINE YELLOW 08/19/2018 1241   APPEARANCEUR CLOUDY (A) 08/19/2018 1241   LABSPEC 1.018 08/19/2018 1241   PHURINE 6.0 08/19/2018 1241   GLUCOSEU NEGATIVE 08/19/2018 1241   HGBUR NEGATIVE 08/19/2018 1241   HGBUR large 10/03/2010 1530   BILIRUBINUR NEGATIVE 08/19/2018 1241   BILIRUBINUR neg 03/15/2011 1719   KETONESUR NEGATIVE 08/19/2018 1241   PROTEINUR 30 (A) 08/19/2018 1241   UROBILINOGEN 1.0 10/08/2015  2348   NITRITE POSITIVE (A) 08/19/2018 1241   LEUKOCYTESUR LARGE (A)  08/19/2018 1241   LEUKOCYTESUR mod 01/26/2014   Sepsis Labs Invalid input(s): PROCALCITONIN,  WBC,  LACTICIDVEN Microbiology Recent Results (from the past 240 hour(s))  Urine culture     Status: Abnormal   Collection Time: 08/16/18  5:15 PM  Result Value Ref Range Status   Specimen Description   Final    URINE, CLEAN CATCH Performed at [facility], [address], [place], [place] [address]    Special Requests   Final    NONE Performed at [facility], [address], [place], [place] [address]    Culture MULTIPLE SPECIES PRESENT, SUGGEST RECOLLECTION (A)  Final   Report Status 08/17/2018 FINAL  Final  Urine C&S     Status: Abnormal   Collection Time: 08/19/18 12:56 PM  Result Value Ref Range Status   Specimen Description   Final    URINE, CLEAN CATCH Performed at [facility], [address], [place], [place] [address]    Special Requests   Final    NONE Performed at [facility], [address], [place], [place] [address]    Culture MULTIPLE SPECIES PRESENT, SUGGEST RECOLLECTION (A)  Final   Report Status 08/20/2018 FINAL  Final  Culture, blood (Routine x 2)     Status: None (Preliminary result)   Collection Time: 08/19/18 12:56 PM  Result Value Ref Range Status   Specimen Description   Final    BLOOD BLOOD LEFT FOREARM Performed at [facility], [address], [place], [place] [address]    Special Requests   Final    BOTTLES DRAWN AEROBIC ONLY Blood Culture results may not be optimal due to an inadequate volume of blood received in culture bottles Performed at [facility], [address], [place], [place] [address]    Culture   Final    NO GROWTH 3 DAYS Performed at [facility], [address], [place], [place] [address]    Report Status PENDING  Incomplete  Culture, blood (Routine x 2)     Status: Abnormal   Collection Time: 08/19/18 12:56  PM  Result Value Ref Range Status   Specimen Description   Final    BLOOD RIGHT ANTECUBITAL Performed at [facility], [address] [place], [place] [address]    Special Requests   Final    BOTTLES DRAWN AEROBIC AND ANAEROBIC Blood Culture adequate volume Performed at [facility], [address] [place], [place] [address]    Culture  Setup Time   Final    GRAM POSITIVE COCCI AEROBIC BOTTLE ONLY CRITICAL RESULT CALLED TO, READ BACK BY AND VERIFIED WITH: WOFFORD PHARMD AT 1500 ON [id number] BY SJW    Culture (A)  Final    STAPHYLOCOCCUS SPECIES (COAGULASE NEGATIVE) THE SIGNIFICANCE OF ISOLATING THIS ORGANISM FROM A SINGLE SET OF BLOOD CULTURES WHEN MULTIPLE SETS ARE DRAWN IS UNCERTAIN. PLEASE NOTIFY THE MICROBIOLOGY DEPARTMENT WITHIN ONE WEEK IF SPECIATION AND SENSITIVITIES ARE REQUIRED. Performed at [facility], [address], [place], [place] [address]    Report Status 08/22/2018 FINAL  Final  Blood Culture ID Panel (Reflexed)     Status: Abnormal   Collection Time: 08/19/18 12:56 PM  Result Value Ref Range Status   Enterococcus species NOT DETECTED NOT DETECTED Final   Listeria monocytogenes NOT DETECTED NOT DETECTED Final   Staphylococcus  species DETECTED (A) NOT DETECTED Final    Comment: Methicillin (oxacillin) resistant coagulase negative staphylococcus. Possible blood culture contaminant (unless isolated from more than one blood culture draw or clinical case suggests pathogenicity). No antibiotic treatment is indicated for blood  culture contaminants. CRITICAL RESULT CALLED TO, READ BACK BY AND VERIFIED WITH: WOFFORD PHARMD AT 1500 ON [id number] BY SJW    Staphylococcus aureus NOT DETECTED NOT DETECTED Final   Methicillin resistance DETECTED (A) NOT DETECTED Final    Comment: CRITICAL RESULT CALLED TO, READ BACK BY AND VERIFIED WITH: WOFFORD PHARMD AT 1500 ON [id number] BY SJW    Streptococcus species NOT DETECTED NOT DETECTED Final    Streptococcus agalactiae NOT DETECTED NOT DETECTED Final   Streptococcus pneumoniae NOT DETECTED NOT DETECTED Final   Streptococcus pyogenes NOT DETECTED NOT DETECTED Final   Acinetobacter baumannii NOT DETECTED NOT DETECTED Final   Enterobacteriaceae species NOT DETECTED NOT DETECTED Final   Enterobacter cloacae complex NOT DETECTED NOT DETECTED Final   Escherichia coli NOT DETECTED NOT DETECTED Final   Klebsiella oxytoca NOT DETECTED NOT DETECTED Final   Klebsiella pneumoniae NOT DETECTED NOT DETECTED Final   Proteus species NOT DETECTED NOT DETECTED Final   Serratia marcescens NOT DETECTED NOT DETECTED Final   Haemophilus influenzae NOT DETECTED NOT DETECTED Final   Neisseria meningitidis NOT DETECTED NOT DETECTED Final   Pseudomonas aeruginosa NOT DETECTED NOT DETECTED Final   Candida albicans NOT DETECTED NOT DETECTED Final   Candida glabrata NOT DETECTED NOT DETECTED Final   Candida krusei NOT DETECTED NOT DETECTED Final   Candida parapsilosis NOT DETECTED NOT DETECTED Final   Candida tropicalis NOT DETECTED NOT DETECTED Final    Comment: Performed at [facility], [address] [place], [place] [address]  MRSA PCR Screening     Status: None   Collection Time: 08/19/18  9:04 PM  Result Value Ref Range Status   MRSA by PCR NEGATIVE NEGATIVE Final    Comment:        The GeneXpert MRSA Assay (FDA approved for NASAL specimens only), is one component of a comprehensive MRSA colonization surveillance program. It is not intended to diagnose MRSA infection nor to guide or monitor treatment for MRSA infections. Performed at [facility], [address] [place], [place] [address]   Culture, Urine     Status: None   Collection Time: 08/21/18  7:53 AM  Result Value Ref Range Status   Specimen Description   Final    URINE, CATHETERIZED Performed at [facility], [address], [place], [place] [address]    Special Requests   Final     NONE Performed at [facility], [address] [place], [place] [address]    Culture   Final    NO GROWTH Performed at [facility], [address], [place], [place] [address]    Report Status 08/22/2018 FINAL  Final     Time coordinating discharge:  I have spent 35 minutes face to face with the patient and on the ward discussing the patients care, assessment, plan and disposition with other care givers. >50% of the time was devoted counseling the patient about the risks and benefits of treatment/Discharge disposition and coordinating care.   SIGNED:   Dimple Nanas, MD  [facility] 08/22/2018, 10:36 AM Pager   If 7PM-7AM, please contact night-coverage [email or web address] Password [email or web address]

## 2018-08-22 NOTE — Progress Notes (Signed)
Patient discharging back to [facility]. CSW faxed appropriate documents and confirmed ability to return with facility.  [facility] has been called for transport.  RN call report to: [phone]  No more CSW needs. Signing off.   Enid CutterLindsey Ruey Storer, MSW, LCSWA Clinical Social Work [phone]

## 2018-08-22 NOTE — Progress Notes (Signed)
Pharmacy Antibiotic Note  [name] SlickerJudith [name] is a 42 y.o. female admitted on 08/19/2018 with UTI - history of ESBL UTI per H&P.  Pharmacy has been consulted for meropenem dosing.  Patient has a PMH significant for cerebral palsy with suprapubic catheter and a history of recurrent UTIs. Patient seen in ED several days ago and prescribed SMX/TMP for UTI - presenting to the ED today with complaints of abdominal pain, fever/sweats.   Per notes: planning for 5-7 days of meropenem. Blood culture likely a contaminant.  Today, 08/22/18  No new labs  Afebrile  Plan:  Continue meropenem 1 g IV q8h    Temp (24hrs), Avg:98.5 F (36.9 C), Min:98.3 F (36.8 C), Max:98.7 F (37.1 C)  Recent Labs  Lab 08/16/18 1509 08/19/18 1256 08/19/18 1318 08/19/18 1607 08/20/18 0521 08/20/18 0608  WBC 5.4 6.1  --   --   --  5.7  CREATININE 0.63 0.82  --   --  0.64  --   LATICACIDVEN  --   --  3.91* 1.42  --   --     Estimated Creatinine Clearance: 63 mL/min (by C-G formula based on SCr of 0.64 mg/dL).    Allergies  Allergen Reactions  . Morphine Dermatitis and Other (See Comments)    Skin turned red   Antimicrobials this admission: meropenem 9/18 >>   Dose adjustments this admission:  Microbiology results: 9/18 BCx: 1/4 coag negative staph 9/18 UCx: multiple species, suggest recollected 9/20 UCx: No growth, final. On ABX prior to culture being obtained  Thank you for allowing pharmacy to be a part of this patient's care.  Cindi CarbonMary M Murray Durrell, PharmD, BCPS Clinical Pharmacist 08/22/2018 1:05 PM

## 2018-08-22 NOTE — NC FL2 (Signed)
[place] MEDICAID FL2 LEVEL OF CARE SCREENING TOOL     IDENTIFICATION  Patient Name: [name] [name] Birthdate: 1975/12/27 Sex: female Admission Date (Current Location): 08/19/2018  [facility] and [place] Number:  Producer, television/film/video and Address:  [facility],  [address] [place], [place] [address]      Provider Number: [id number]  Attending Physician Name and Address:  Dimple Nanas, MD  Relative Name and Phone Number:  Arora Coakley: [phone]    Current Level of Care: Hospital Recommended Level of Care: Assisted Living Facility Prior Approval Number:    Date Approved/Denied:   PASRR Number:    Discharge Plan: Other (Comment)([facility])    Current Diagnoses: Patient Active Problem List   Diagnosis Date Noted  . Connective tissue disorder ([name]) 08/07/2018  . Migraine without aura and without status migrainosus, not intractable 06/26/2018  . UTI (urinary tract infection) 05/25/2018  . Headache 05/25/2018  . Incontinent of feces 03/11/2016  . Chronic anticoagulation 03/11/2016  . Involuntary movements 02/22/2016  . Seizures ([name]) 02/12/2016  . Dyspnea 02/12/2016  . Cerebral palsy, quadriplegic ([name]) 02/06/2016  . Swelling of extremity 01/13/2016  . Fever   . Pressure ulcer 10/09/2015  . Status post insertion of inferior vena caval filter 09/28/2015  . Absence of bladder continence 08/25/2015  . Person living in residential institution 06/14/2015  . Pulmonary hypertension ([name])   . Anemia of chronic disease   . Spasticity 03/15/2015  . Constipation, chronic 10/10/2014  . Athetoid cerebral palsy ([name]) 04/13/2014  . Dystonia 04/12/2014  . UTI (lower urinary tract infection) 03/16/2014  . Presence of intrathecal baclofen pump 03/07/2014  . Dental caries 07/18/2013  . Multiple thyroid nodules 05/20/2013  . Osteoarthrosis 04/07/2013  . [name]   . Dysarthria   . GERD (gastroesophageal reflux disease)   . Neurogenic  bladder 02/05/2013  . Anxiety state 10/21/2012  . Chronic pain syndrome 06/17/2012  . Recurrent pulmonary embolism ([name]) 05/03/2011  . Pulmonary embolism with infarction ([name]) 05/03/2011  . Chronic suprapubic catheter ([name]) 03/15/2011  . Disease of female genital organs 10/24/2010  . HYPERTENSION, BENIGN 01/31/2010  . CP (cerebral palsy), spastic ([name]) 12/27/2008  . Contracted, joint, multiple sites 12/27/2008  . Major depressive disorder, recurrent episode ([name]) 01/29/2007  . Obsessive Compulsive Disorder 01/29/2007  . Transient alteration of awareness, recurrent 09/08/2006    Orientation RESPIRATION BLADDER Height & Weight     Time, Self, Situation, Place  Normal Indwelling catheter Weight:   Height:     BEHAVIORAL SYMPTOMS/MOOD NEUROLOGICAL BOWEL NUTRITION STATUS      Incontinent Diet(Regular)  AMBULATORY STATUS COMMUNICATION OF NEEDS Skin   Extensive Assist Verbally Normal                       Personal Care Assistance Level of Assistance  Bathing, Feeding, Dressing Bathing Assistance: Maximum assistance Feeding assistance: Limited assistance Dressing Assistance: Maximum assistance     Functional Limitations Info  Sight, Hearing, Speech Sight Info: Adequate Hearing Info: Adequate Speech Info: Impaired    SPECIAL CARE FACTORS FREQUENCY                       Contractures Contractures Info: Not present    Additional Factors Info  Allergies, Code Status, Isolation Precautions Code Status Info: Full Allergies Info: MORPHINE      Isolation Precautions Info: Contact precautions     Current Medications (08/22/2018):  This is the current hospital active  medication list Current Facility-Administered Medications  Medication Dose Route Frequency Provider Last Rate Last Dose  . acetaminophen (TYLENOL) tablet 650 mg  650 mg Oral Q8H PRN Leatha GildingGherghe, Costin M, MD   650 mg at 08/21/18 1849  . amantadine (SYMMETREL) capsule 100 mg  100 mg Oral Daily Leatha GildingGherghe, Costin  M, MD   100 mg at 08/22/18 1142  . baclofen (LIORESAL) tablet 20 mg  20 mg Oral QID Leatha GildingGherghe, Costin M, MD   20 mg at 08/22/18 0802  . bisacodyl (DULCOLAX) suppository 10 mg  10 mg Rectal BID PRN Leatha GildingGherghe, Costin M, MD      . busPIRone (BUSPAR) tablet 5 mg  5 mg Oral TID Leatha GildingGherghe, Costin M, MD   5 mg at 08/22/18 1134  . butalbital-acetaminophen-caffeine (FIORICET, ESGIC) 50-325-40 MG per tablet 2 tablet  2 tablet Oral Q4H PRN Leatha GildingGherghe, Costin M, MD   2 tablet at 08/22/18 0801  . clonazePAM (KLONOPIN) tablet 0.75 mg  0.75 mg Oral TID Leatha GildingGherghe, Costin M, MD   0.75 mg at 08/22/18 1134  . feeding supplement (ENSURE ENLIVE) (ENSURE ENLIVE) liquid 237 mL  237 mL Oral BID BM Leatha GildingGherghe, Costin M, MD   237 mL at 08/21/18 1407  . ferrous sulfate tablet 325 mg  325 mg Oral Daily Leatha GildingGherghe, Costin M, MD   325 mg at 08/22/18 1135  . FLUoxetine (PROZAC) capsule 80 mg  80 mg Oral Daily Leatha GildingGherghe, Costin M, MD   80 mg at 08/22/18 1133  . iopamidol (ISOVUE-300) 61 % injection 100 mL  100 mL Intravenous Once PRN Little, Ambrose Finlandachel Morgan, MD      . lactulose (CHRONULAC) 10 GM/15ML solution 20 g  20 g Oral Daily PRN Leatha GildingGherghe, Costin M, MD      . lamoTRIgine (LAMICTAL) tablet 100 mg  100 mg Oral QHS Leatha GildingGherghe, Costin M, MD   100 mg at 08/21/18 2100  . linaclotide (LINZESS) capsule 145 mcg  145 mcg Oral QAC breakfast Leatha GildingGherghe, Costin M, MD   145 mcg at 08/22/18 0900  . Melatonin TABS 5 mg  5 mg Oral QHS Leatha GildingGherghe, Costin M, MD   5 mg at 08/21/18 2100  . meropenem (MERREM) 1 g in sodium chloride 0.9 % 100 mL IVPB  1 g Intravenous Q8H Cindi CarbonSwayne, Mary M, RPH 200 mL/hr at 08/22/18 0532 1 g at 08/22/18 0532  . multivitamin liquid 15 mL  15 mL Oral Daily Leatha GildingGherghe, Costin M, MD   15 mL at 08/22/18 1144  . ondansetron (ZOFRAN) injection 4 mg  4 mg Intravenous Q6H PRN Amin, Loura HaltAnkit Chirag, MD   4 mg at 08/22/18 0802  . oxybutynin (DITROPAN) tablet 5 mg  5 mg Oral QID Leatha GildingGherghe, Costin M, MD   5 mg at 08/22/18 0802  . oxyCODONE (Oxy IR/ROXICODONE) immediate  release tablet 5 mg  5 mg Oral Q4H PRN Schorr, Roma KayserKatherine P, NP   5 mg at 08/22/18 0802  . polyethylene glycol (MIRALAX / GLYCOLAX) packet 17 g  17 g Oral BID Cindi CarbonSwayne, Mary M, RPH   17 g at 08/21/18 [id number]  . QUEtiapine (SEROQUEL) tablet 150 mg  150 mg Oral QHS Leatha GildingGherghe, Costin M, MD   150 mg at 08/21/18 2048  . rivaroxaban (XARELTO) tablet 10 mg  10 mg Oral Daily Leatha GildingGherghe, Costin M, MD   10 mg at 08/22/18 1134  . sodium chloride flush (NS) 0.9 % injection 10-40 mL  10-40 mL Intracatheter PRN Amin, Loura HaltAnkit Chirag, MD      . tiZANidine (ZANAFLEX)  tablet 4 mg  4 mg Oral BID Leatha Gilding, MD   4 mg at 08/22/18 1133  . topiramate (TOPAMAX) tablet 100 mg  100 mg Oral QHS Cindi Carbon, [facility]         Discharge Medications: acetaminophen 650 MG CR tablet Commonly known as:  TYLENOL Take 1 tablet (650 mg total) by mouth every 8 (eight) hours as needed. What changed:  reasons to take this   amantadine 100 MG capsule Commonly known as:  SYMMETREL Take 100 mg by mouth daily.   baclofen 20 MG tablet Commonly known as:  LIORESAL Take 20 mg by mouth 4 (four) times daily. For bladder spasms   bisacodyl 10 MG suppository Commonly known as:  DULCOLAX Place 10 mg rectally 2 (two) times daily as needed for moderate constipation.   Buprenorphine HCl 450 MCG Film Place 450 mcg inside cheek every 12 (twelve) hours. What changed:  how much to take   busPIRone 5 MG tablet Commonly known as:  BUSPAR Take 5 mg by mouth 3 (three) times daily.   butalbital-acetaminophen-caffeine 50-325-40 MG tablet Commonly known as:  FIORICET, ESGIC Take 2 tablets by mouth every 4 (four) hours as needed for headache (not controlled by Tylenol).   CERTA-VITE PO Take 1 tablet by mouth daily.   clonazePAM 0.5 MG tablet Commonly known as:  KLONOPIN Take 1.5 tablets (0.75 mg total) by mouth 3 (three) times daily.   feeding supplement (ENSURE ENLIVE) Liqd Take 237 mLs by mouth 2 (two) times daily between  meals.   ferrous sulfate 325 (65 FE) MG tablet Take 325 mg by mouth daily.   fluconazole 200 MG tablet Commonly known as:  DIFLUCAN Take 1 tablet (200 mg total) by mouth daily for 14 days.   FLUoxetine 40 MG capsule Commonly known as:  PROZAC Take 80 mg by mouth daily.   lactulose 10 GM/15ML solution Commonly known as:  CHRONULAC Take 20 g by mouth daily as needed for mild constipation or moderate constipation.   lamoTRIgine 100 MG tablet Commonly known as:  LAMICTAL Take 100 mg by mouth at bedtime.   linaclotide 145 MCG Caps capsule Commonly known as:  LINZESS Take 145 mcg by mouth daily before breakfast.   lubiprostone 24 MCG capsule Commonly known as:  AMITIZA Take 1 capsule (24 mcg total) by mouth 2 (two) times daily with a meal.   Melatonin 5 MG Tabs Take 5 mg by mouth at bedtime.   meropenem 1 g in sodium chloride 0.9 % 100 mL Inject 1 g into the vein every 8 (eight) hours.   oxybutynin 5 MG tablet Commonly known as:  DITROPAN Take 5 mg by mouth 4 (four) times daily.   polyethylene glycol powder powder Commonly known as:  GLYCOLAX/MIRALAX Take 1  1/2 dose  daily in 12 ounces of fluid every day. What changed:    how much to take  how to take this  when to take this  additional instructions   QUEtiapine 50 MG tablet Commonly known as:  SEROQUEL Take 150 mg by mouth at bedtime.   rivaroxaban 10 MG Tabs tablet Commonly known as:  XARELTO Take 10 mg by mouth daily.   ROXICODONE 5 MG immediate release tablet Generic drug:  oxyCODONE Take 5 mg by mouth every 4 (four) hours as needed for moderate pain or severe pain.   sulfamethoxazole-trimethoprim 800-160 MG tablet Commonly known as:  BACTRIM DS,SEPTRA DS Take 1 tablet by mouth 2 (two) times daily for 7 days.  tiZANidine 4 MG capsule Commonly known as:  ZANAFLEX Take 4 mg by mouth 2 (two) times daily.   topiramate 25 MG tablet Commonly known as:  TOPAMAX Take 3 tablets (75mg )  at bedtime for 2 weeks, then take 4 tablets (100mg ) at bedtime What changed:    how much to take  how to take this  when to take this  additional instructions     Relevant Imaging Results:  Relevant Lab Results:   Additional Information SSN: [id number]  Enid Cutter, LCSW

## 2018-08-22 NOTE — Care Management Important Message (Signed)
Important Message  Patient Details  Name: Mindi SlickerJudith Gilpin MRN: [id number] Date of Birth: 09-28-1976   Medicare Important Message Given:  Yes    Elliot CousinShavis, Yazleen Molock Ellen, RN 08/22/2018, 12:11 PM

## 2018-08-22 NOTE — Progress Notes (Signed)
Sacral dressing applied to pt. Pt refused turns throughout the night shift.

## 2018-08-24 ENCOUNTER — Telehealth (INDEPENDENT_AMBULATORY_CARE_PROVIDER_SITE_OTHER): Payer: Self-pay | Admitting: Family

## 2018-08-24 LAB — CULTURE, BLOOD (ROUTINE X 2): Culture: NO GROWTH

## 2018-08-24 NOTE — Telephone Encounter (Signed)
[name] [name] called and askeDarel Hongd to speak to me. She was very difficulty to understand. She said that "her body was acting up" and said that she was "having blackout spells again".  She said that it scared her when she lost track of time and events. I talked with [name] HongJudy and reminded her that she has had these episodes in the past and they have not proven to be seizures, but more of a response to stress. [name] HongJudy has an appointment with me on Thursday 08/27/18 to follow up for headaches and agreed to talk more about her concerns at that time as it is difficult to understand her on the phone. TG

## 2018-08-24 NOTE — Telephone Encounter (Signed)
Thank you for taking the call, please keep me informed.

## 2018-08-26 DIAGNOSIS — I2699 Other pulmonary embolism without acute cor pulmonale: Secondary | ICD-10-CM | POA: Diagnosis not present

## 2018-08-26 DIAGNOSIS — Z9359 Other cystostomy status: Secondary | ICD-10-CM | POA: Diagnosis not present

## 2018-08-26 DIAGNOSIS — R532 Functional quadriplegia: Secondary | ICD-10-CM | POA: Diagnosis not present

## 2018-08-26 DIAGNOSIS — Z792 Long term (current) use of antibiotics: Secondary | ICD-10-CM | POA: Diagnosis not present

## 2018-08-27 ENCOUNTER — Ambulatory Visit (INDEPENDENT_AMBULATORY_CARE_PROVIDER_SITE_OTHER): Payer: Medicare Other | Admitting: Family

## 2018-08-27 DIAGNOSIS — F4322 Adjustment disorder with anxiety: Secondary | ICD-10-CM | POA: Diagnosis not present

## 2018-08-30 ENCOUNTER — Encounter (HOSPITAL_COMMUNITY): Payer: Self-pay

## 2018-08-30 ENCOUNTER — Emergency Department (HOSPITAL_COMMUNITY): Payer: Medicare Other

## 2018-08-30 ENCOUNTER — Other Ambulatory Visit: Payer: Self-pay

## 2018-08-30 ENCOUNTER — Emergency Department (HOSPITAL_COMMUNITY)
Admission: EM | Admit: 2018-08-30 | Discharge: 2018-08-30 | Disposition: A | Discharge status: Home / Self Care | Payer: Medicare Other | Attending: Emergency Medicine | Admitting: Emergency Medicine

## 2018-08-30 DIAGNOSIS — G809 Cerebral palsy, unspecified: Secondary | ICD-10-CM | POA: Insufficient documentation

## 2018-08-30 DIAGNOSIS — R531 Weakness: Secondary | ICD-10-CM | POA: Diagnosis not present

## 2018-08-30 DIAGNOSIS — Z79899 Other long term (current) drug therapy: Secondary | ICD-10-CM | POA: Insufficient documentation

## 2018-08-30 DIAGNOSIS — Z993 Dependence on wheelchair: Secondary | ICD-10-CM | POA: Insufficient documentation

## 2018-08-30 DIAGNOSIS — M6281 Muscle weakness (generalized): Secondary | ICD-10-CM | POA: Insufficient documentation

## 2018-08-30 DIAGNOSIS — R0989 Other specified symptoms and signs involving the circulatory and respiratory systems: Secondary | ICD-10-CM | POA: Diagnosis not present

## 2018-08-30 DIAGNOSIS — E860 Dehydration: Secondary | ICD-10-CM | POA: Diagnosis not present

## 2018-08-30 DIAGNOSIS — R5383 Other fatigue: Secondary | ICD-10-CM | POA: Diagnosis not present

## 2018-08-30 DIAGNOSIS — Z7901 Long term (current) use of anticoagulants: Secondary | ICD-10-CM | POA: Insufficient documentation

## 2018-08-30 LAB — CBC WITH DIFFERENTIAL/PLATELET
BASOS PCT: 1 %
Basophils Absolute: 0 10*3/uL (ref 0.0–0.1)
Eosinophils Absolute: 0.6 10*3/uL (ref 0.0–0.7)
Eosinophils Relative: 14 %
HEMATOCRIT: 40.2 % (ref 36.0–46.0)
HEMOGLOBIN: 13.3 g/dL (ref 12.0–15.0)
LYMPHS PCT: 47 %
Lymphs Abs: 2.1 10*3/uL (ref 0.7–4.0)
MCH: 30.4 pg (ref 26.0–34.0)
MCHC: 33.1 g/dL (ref 30.0–36.0)
MCV: 92 fL (ref 78.0–100.0)
MONO ABS: 0.4 10*3/uL (ref 0.1–1.0)
MONOS PCT: 8 %
NEUTROS ABS: 1.3 10*3/uL — AB (ref 1.7–7.7)
NEUTROS PCT: 30 %
Platelets: 259 10*3/uL (ref 150–400)
RBC: 4.37 MIL/uL (ref 3.87–5.11)
RDW: 14.4 % (ref 11.5–15.5)
WBC: 4.4 10*3/uL (ref 4.0–10.5)

## 2018-08-30 LAB — COMPREHENSIVE METABOLIC PANEL
ALBUMIN: 4.2 g/dL (ref 3.5–5.0)
ALK PHOS: 88 U/L (ref 38–126)
ALT: 19 U/L (ref 0–44)
ANION GAP: 10 (ref 5–15)
AST: 21 U/L (ref 15–41)
BILIRUBIN TOTAL: 0.6 mg/dL (ref 0.3–1.2)
BUN: 23 mg/dL — AB (ref 6–20)
CALCIUM: 10 mg/dL (ref 8.9–10.3)
CO2: 23 mmol/L (ref 22–32)
Chloride: 107 mmol/L (ref 98–111)
Creatinine, Ser: 0.86 mg/dL (ref 0.44–1.00)
GFR calc Af Amer: >60 mL/min (ref >60)
GLUCOSE: 115 mg/dL — AB (ref 70–99)
POTASSIUM: 4.5 mmol/L (ref 3.5–5.1)
Sodium: 140 mmol/L (ref 135–145)
TOTAL PROTEIN: 7.6 g/dL (ref 6.5–8.1)

## 2018-08-30 LAB — URINALYSIS, ROUTINE W REFLEX MICROSCOPIC
BILIRUBIN URINE: NEGATIVE
Glucose, UA: NEGATIVE mg/dL
Ketones, ur: NEGATIVE mg/dL
Nitrite: NEGATIVE
PROTEIN: 30 mg/dL — AB
SPECIFIC GRAVITY, URINE: 1.018 (ref 1.005–1.030)
WBC, UA: >50 WBC/hpf — ABNORMAL HIGH (ref 0–5)
pH: 5 (ref 5.0–8.0)

## 2018-08-30 LAB — I-STAT CG4 LACTIC ACID, ED: LACTIC ACID, VENOUS: 0.85 mmol/L (ref 0.5–1.9)

## 2018-08-30 MED ORDER — SODIUM CHLORIDE 0.9 % IV BOLUS
1000.0000 mL | Freq: Once | INTRAVENOUS | Status: AC
  Administered 2018-08-30: 1000 mL via INTRAVENOUS

## 2018-08-30 NOTE — ED Notes (Signed)
[facility] called  

## 2018-08-30 NOTE — ED Notes (Signed)
Urinary drainage bag changed to obtain clean urine sample

## 2018-08-30 NOTE — ED Provider Notes (Signed)
[facility]-EMERGENCY DEPT Provider Note   CSN: [id number] Arrival date & time: 08/30/18  1124     History   Chief Complaint Chief Complaint  Patient presents with  . generalized weakness  . Recurrent UTI    HPI [name] [name] is a 42 y.o. female.  [name] [name] is a 42 y.o. Female with a history of CP, wheelchair-bound, with chronic indwelling catheter, seizures, PE on Coumadin, GERD, chronic dyspnea, who presents via EMS from [facility] for evaluation of generalized weakness, patient was noted to be hypotensive when EMS arrived, but did not have a syncopal episode.  Pt has not had any recent fevers.  Denies any focal pain anywhere aside from back spasms which are chronic for her.  She denies chest pain or shortness of breath.  She denies any abdominal pain, no recent nausea, vomiting or diarrhea.  Has a chronic indwelling catheter and history of frequent urinary tract infections.  Urine noted to be red and malodorous on arrival.  Patient is wheelchair-bound, a church member took the wheelchair back to her residence, she stays at [employer].  She was able to provide limited history, due to CP, speech difficulties and very difficult to understand  Level 5 caveat: CP speech difficulty, very difficult to understand     Past Medical History:  Diagnosis Date  . Abdominal pain 01/02/2012   Overview:  Overview:  Per Previous pcp note- Dr. Ta--Pt has history of endometriosis and had DUB in 2011.  She She had u/s that showed normal endometrial stripe.  She had endometrial bx that was wnl.  She has a lot of abd cramping every month.  This cramping was sometimes not relieved with hydrocodone.  She was referred to [facility] for IUD placement to help endometriosis and abd cramping.  [name]  . Abdominal pain 01/02/2012   Overview:  Overview:  Per Previous pcp note- Dr. Ta--Pt has history of endometriosis and had DUB in 2011.  She She had u/s that showed normal endometrial  stripe.  She had endometrial bx that was wnl.  She has a lot of abd cramping every month.  This cramping was sometimes not relieved with hydrocodone.  She was referred to [facility] for IUD placement to help endometriosis and abd cramping.  [name] was placed 04/30/11 by [facility].  Pt still complains of abd cramping, which I am treating with hydrocodone and ultram. Pt reports some initial relief of pain from IUD, but states it has now returned back to similar to previous cramping. Gave patient a shot of Toradol 60 mg IM today-since positive exacerbation of abdominal cramping during past 2 weeks. Physical exam reassuring. Patient may need to return to [facility] for further workup since she is plugged in with the GYN providers there for further treatment options regarding endometriosis and abdominal cramping. The dysfunctional uterine bleeding now well contr  . Acute cystitis with hematuria   . Acute cystitis without hematuria   . Acute respiratory failure with hypoxia ([name])   . Anemia   . Cellulitis 12/22/2014  . Cerebral palsy ([name])    Spastic Cerebral palsy, mentally intact  . Contracture, joint, multiple sites    Electric wheelchair, uses left hand to operate chair.   . Depression   . Disease of female genital organs 10/24/2010   Overview:  Overview:  Pt has history of endometriosis and had DUB in 2011.  She had u/s that showed normal endometrial stripe.  She had endometrial bx that was wnl.  She has a lot of abd cramping every month.  This cramping was sometimes not relieved with hydrocodone.  She was referred to [facility] for IUD placement to help endometriosis and abd cramping.  [name] was placed 04/30/11 by [facility].  Pt still complains of abd cramping, which was treated with hydrocodone and ultram.   Last seen at [facility] by OB/GYN 12/24/11, given Depo Lupron injection.  Ordering CT scan to r/o additional cause of abdominal pain (pt with known endometriosis and will not tolerate vaginal U/S, pain not  improved s/p [name] or with Lupron injection-- only caused hot flashes and amenorrhea, no pain relief).  Also made referral to pain clinic for better pain control.  Do not think hysterectomy would help with pain, and concern for contamination of baclofen pump if did proceed with surgery.    8/5/13[name] OB/GY[facility]: Pt was seen by   . [name]   . Dysarthria   . Dyspnea and respiratory abnormality 02/04/2011   Overview:  Overview:  Pt has history of recurrent PE and is on chronic coumadin.  Pt has been complaining of dyspnea that she describes as chest tightness.  She has been to the ER multiple times for this.  Usually she gets CTA and serial CE.  She has had at least 6 CTA in a period of since 2011.  She also has an O2 requirement. It is unclear why pt feels dyspneic, complains of chest tightness, or has O2 requirement.  I thought that her complaint may be from deconditioning and referred her to PT.  Unfortunately Medicaid only pays for 4 PT visits.  Pt should be doing these exercises at home, but she has not.  I have referred her to [facility], and she has been seen by [name].  He did not understand the etiology of her dyspnea and O2 requirement.  He will continue to follow her.   Last Assessment & Plan:  Pt presents with concerns for chest tightness and dyspnea.  She was seen in ED yesterday and discharged home.  While there she had a CT chest 04/20/11 showed 1.  Multifocal degradation as detailed above.  N  . Endometriosis   . GERD (gastroesophageal reflux disease)   . Gram positive sepsis ([name]) 10/24/2014  . Gross hematuria 03/12/2013   Overview:  Overview:  status post replacement of suprapubic tube 03/04/2013  Last Assessment & Plan:  Recent urine culture collected in ED on 5/4 did not result in significant growth. Moreover, patient has been asymptomatic with normal WBC, no fever and no true symptoms making symptomatic UTI less likely.  Will continue to monitor for any sign of  infection.  Patient's xarelto had been held during her recent hospitalization due to hematuria. This has since been re-started. Monitor bleeding.    [name] Kitchen HCAP (healthcare-associated pneumonia) 10/25/2014  . HCAP (healthcare-associated pneumonia) 11/24/2017  . History of anticoagulant therapy 10/21/2012  . History of endometriosis 10/2012   OBGYN [facility]: Laparoscopic Endometrial Ablation, TVH,  . History of recurrent UTIs   . Hypertension   . HYPERTENSION, BENIGN 01/31/2010   Qualifier: Diagnosis of  By: [name], MD, [name] Incontinent of feces 03/11/2016  . Infantile cerebral palsy ([name]) 01/29/2007   Overview:  She has spastic CP.  She is mentally intact.  She is wheelchair bound.  She is wheelchair bound for ambulation      . Intracervical pessary 05/07/2011   Overview:  Overview:  Placed by [facility] GYN  04/30/11 to treat DUB and Endometriosis.  She is seen at GYN clinic at [facility] but was considered a poor surgical candidate and referred her to [facility].  For DUB she had pelvic ultrasound that showed thin stripe and she had endometrial Bx by Dr Jennette Kettle in GYN clinic, which was negative.     . Macroscopic hematuria 03/10/2013   status post replacement of suprapubic tube 03/04/2013   . Migraine   . OCD (obsessive compulsive disorder)   . Parapneumonic effusion 10/25/2014  . Presence of intrathecal baclofen pump 03/07/2014   R LQ. Insertion T10.    [name] Kitchen Psychiatric illness 10/31/2014  . Pulmonary embolism ([name])    Lifetime Coumadin  . Pulmonary embolism ([name]) 2011, 01/2011   Will be on lifetime coumadin  . Recurrent pulmonary embolism ([name]) 05/03/2011   Pt has history of recurrent PE and is on chronic coumadin.  Pt has been complaining of dyspnea that she describes as chest tightness.  She has been to the ER multiple times for this.  Usually she gets CTA and serial CE.  She has had at least 6 CTA in a period of since 2011.  She also has an O2 requirement. It is unclear why pt feels dyspneic, complains  of chest tightness, or has O2 requirement.  I thought that her complaint may be from deconditioning and referred her to PT.  Unfortunately Medicaid only pays for 4 PT visits.  Pt should be doing these exercises at home, but she has not.  I have referred her to [facility] and she has been seen by [name].  He did not understand the etiology of her dyspnea and O2 requirement.  He will continue to follow her.     . Seborrheic keratosis 01/18/2011  . Seizures ([name]) 02/12/2016  . Spasticity 01/05/2014  . Transient alteration of awareness, recurrent 09/08/2006   Recurrent episodes where Skyanne  loses contact with her world and that have been studied thoroughly and appear nonepileptic in nature per Dr Terrace Arabia (neurology) The patient has had episodes starting in 2007 that initially began 1-4 times a day during which time she would have periods of unawareness followed by confusion. This continuous video EEG monitoring performed from October 8-12, 2007. Had   . Urethra dilated and patulous 07/03/2015   Patulous, Dilated Urethra. 5mL balloon on a 22-Fr catheter hoping this would prevent the bladder spasm from pushing the balloon down the urethra (Dr Logan Bores, Urology [facility], July 2016)     Patient Active Problem List   Diagnosis Date Noted  . Connective tissue disorder ([name]) 08/07/2018  . Migraine without aura and without status migrainosus, not intractable 06/26/2018  . UTI (urinary tract infection) 05/25/2018  . Headache 05/25/2018  . Incontinent of feces 03/11/2016  . Chronic anticoagulation 03/11/2016  . Involuntary movements 02/22/2016  . Seizures ([name]) 02/12/2016  . Dyspnea 02/12/2016  . Cerebral palsy, quadriplegic ([name]) 02/06/2016  . Swelling of extremity 01/13/2016  . Fever   . Pressure ulcer 10/09/2015  . Status post insertion of inferior vena caval filter 09/28/2015  . Absence of bladder continence 08/25/2015  . Person living in residential institution 06/14/2015  . Pulmonary hypertension ([name])   .  Anemia of chronic disease   . Spasticity 03/15/2015  . Constipation, chronic 10/10/2014  . Athetoid cerebral palsy ([name]) 04/13/2014  . Dystonia 04/12/2014  . UTI (lower urinary tract infection) 03/16/2014  . Presence of intrathecal baclofen pump 03/07/2014  . Dental caries 07/18/2013  . Multiple thyroid nodules 05/20/2013  .  Osteoarthrosis 04/07/2013  . [name]   . Dysarthria   . GERD (gastroesophageal reflux disease)   . Neurogenic bladder 02/05/2013  . Anxiety state 10/21/2012  . Chronic pain syndrome 06/17/2012  . Recurrent pulmonary embolism ([name]) 05/03/2011  . Pulmonary embolism with infarction ([name]) 05/03/2011  . Chronic suprapubic catheter ([name]) 03/15/2011  . Disease of female genital organs 10/24/2010  . HYPERTENSION, BENIGN 01/31/2010  . CP (cerebral palsy), spastic ([name]) 12/27/2008  . Contracted, joint, multiple sites 12/27/2008  . Major depressive disorder, recurrent episode ([name]) 01/29/2007  . Obsessive Compulsive Disorder 01/29/2007  . Transient alteration of awareness, recurrent 09/08/2006    Past Surgical History:  Procedure Laterality Date  . ABDOMINAL HYSTERECTOMY    . APPENDECTOMY    . BACLOFEN PUMP REFILL     x 3 times  . CARPAL TUNNEL RELEASE  08/2008   Dr Teressa Senter  . CESAREAN SECTION     x 2  . CHOLECYSTECTOMY  10/14/2015   Procedure: LAPAROSCOPIC CHOLECYSTECTOMY;  Surgeon: Violeta Gelinas, MD;  Location[facility] OR;  Service: General;;  . COLPOSCOPY  06/2000  . INTRAUTERINE DEVICE INSERTION  04/30/11   Inserted by [facility] GYN for endometriosis  . LAPAROSCOPIC ASSISTED VAGINAL HYSTERECTOMY  10/27/2012  . MULTIPLE EXTRACTIONS WITH ALVEOLOPLASTY Bilateral 07/19/2013   Procedure: EXTRACTIONS #4, 0,98,11,91;  Surgeon: Francene Finders, DDS;  Location: [facility] OR;  Service: Oral Surgery;  Laterality: Bilateral;  . NEUROMA SURGERY Left    anterior and posterior intersosseous  . PAIN PUMP IMPLANTATION N/A 03/07/2014   Procedure: baclofen  pump revision/replacement and Catheter connection replacement;  Surgeon: Cristi Loron, MD;  Location: [facility] ORS;  Service: Neurosurgery;  Laterality: N/A;  baclofen pump revision/replacement and Catheter connection replacement  . PROGRAMABLE BACLOFEN PUMP REVISION  03/17/14   Battery Replacement   . TUBAL LIGATION  2003  . URETHRA SURGERY    . WRIST SURGERY  06/2010   Dr Dierdre Searles, hand surgeon, [name]     OB History   None      Home Medications    Prior to Admission medications   Medication Sig Start Date End Date Taking? Authorizing Provider  acetaminophen (TYLENOL 8 HOUR) 650 MG CR tablet Take 1 tablet (650 mg total) by mouth every 8 (eight) hours as needed. Patient taking differently: Take 650 mg by mouth every 8 (eight) hours as needed for pain.  07/08/18   Derwood Kaplan, MD  amantadine (SYMMETREL) 100 MG capsule Take 100 mg by mouth daily.     [provider]  baclofen (LIORESAL) 20 MG tablet Take 20 mg by mouth 4 (four) times daily. For bladder spasms    [provider]  bisacodyl (DULCOLAX) 10 MG suppository Place 10 mg rectally 2 (two) times daily as needed for moderate constipation.     [provider]  Buprenorphine HCl (BELBUCA) 450 MCG FILM Place 450 mcg inside cheek every 12 (twelve) hours. 08/22/18   Amin, Loura Halt, MD  busPIRone (BUSPAR) 5 MG tablet Take 5 mg by mouth 3 (three) times daily.    [provider]  butalbital-acetaminophen-caffeine (FIORICET, ESGIC) 50-325-40 MG tablet Take 2 tablets by mouth every 4 (four) hours as needed for headache (not controlled by Tylenol). 05/29/18   Darlin Drop, DO  clonazePAM (KLONOPIN) 0.5 MG tablet Take 1.5 tablets (0.75 mg total) by mouth 3 (three) times daily. 11/27/17   Narda Bonds, MD  feeding supplement, ENSURE ENLIVE, (ENSURE ENLIVE) LIQD Take 237 mLs by mouth  2 (two) times daily between meals. 05/30/18   Darlin Drop, DO  ferrous sulfate 325 (65 FE) MG tablet Take 325 mg by mouth  daily.     [provider]  fluconazole (DIFLUCAN) 200 MG tablet Take 1 tablet (200 mg total) by mouth daily for 14 days. 08/16/18 08/30/18  Harlene Salts A, PA-C  FLUoxetine (PROZAC) 40 MG capsule Take 80 mg by mouth daily.    [provider]  lactulose (CHRONULAC) 10 GM/15ML solution Take 20 g by mouth daily as needed for mild constipation or moderate constipation.    [provider]  lamoTRIgine (LAMICTAL) 100 MG tablet Take 100 mg by mouth at bedtime.    [provider]  linaclotide (LINZESS) 145 MCG CAPS capsule Take 145 mcg by mouth daily before breakfast.    [provider]  lubiprostone (AMITIZA) 24 MCG capsule Take 1 capsule (24 mcg total) by mouth 2 (two) times daily with a meal. Patient not taking: Reported on 08/19/2018 01/01/17   Esterwood, [name] S, PA-C  Melatonin 5 MG TABS Take 5 mg by mouth at bedtime.     [provider]  meropenem 1 g in sodium chloride 0.9 % 100 mL Inject 1 g into the vein every 8 (eight) hours. 08/21/18   Amin, Loura Halt, MD  Multiple Vitamins-Minerals (CERTA-VITE PO) Take 1 tablet by mouth daily.    [provider]  oxybutynin (DITROPAN) 5 MG tablet Take 5 mg by mouth 4 (four) times daily.  02/20/16   [provider]  oxyCODONE (ROXICODONE) 5 MG immediate release tablet Take 5 mg by mouth every 4 (four) hours as needed for moderate pain or severe pain.    [provider]  polyethylene glycol powder (GLYCOLAX/MIRALAX) powder Take 1  1/2 dose  daily in 12 ounces of fluid every day. Patient taking differently: Take 17 g by mouth 2 (two) times daily.  01/01/17   Esterwood, [name] S, PA-C  QUEtiapine (SEROQUEL) 50 MG tablet Take 150 mg by mouth at bedtime.    [provider]  rivaroxaban (XARELTO) 10 MG TABS tablet Take 10 mg by mouth daily.    [provider]  tiZANidine (ZANAFLEX) 4 MG capsule Take 4 mg by mouth 2 (two) times daily.     [provider]  topiramate  (TOPAMAX) 25 MG tablet Take 3 tablets (75mg ) at bedtime for 2 weeks, then take 4 tablets (100mg ) at bedtime Patient taking differently: Take 75-100 mg by mouth See admin instructions. Take 75 mg by mouth at bedtime for 2 weeks until 08/21/18. Then start 100 mg by mouth at bedtime on 08/22/18 08/07/18   Elveria Rising, NP    Family History Family History  Problem Relation Age of Onset  . Asthma Father   . Colon cancer Maternal Grandmother        Died in her 77's  . Breast cancer Paternal Grandmother        Died in her 16's  . Heart attack Maternal Grandfather        Died in his 77's  . Alzheimer's disease Paternal Grandfather        Died in his 76's  . Breast cancer Mother   . Stomach cancer Neg Hx     Social History Social History   Tobacco Use  . Smoking status: Never Smoker  . Smokeless tobacco: Never Used  Substance Use Topics  . Alcohol use: Yes    Alcohol/week: 1.0 standard drinks    Types: 1  Glasses of wine per week    Comment: Drinks alcohol once a month.  . Drug use: No     Allergies   Morphine   Review of Systems Review of Systems  Constitutional: Positive for fatigue. Negative for chills and fever.  HENT: Negative.   Eyes: Negative for visual disturbance.  Respiratory: Negative for cough and shortness of breath.   Cardiovascular: Negative for chest pain.  Gastrointestinal: Negative for abdominal pain, diarrhea, nausea and vomiting.  Genitourinary: Negative for dysuria.  Skin: Negative for color change, rash and wound.  Neurological: Positive for weakness.  All other systems reviewed and are negative.    Physical Exam Updated Vital Signs BP 92/61   Pulse 66   Temp 98 F (36.7 C) (Oral)   Resp 18   LMP 04/05/2011   SpO2 98%   Physical Exam  Constitutional: She appears well-developed and well-nourished. No distress.  Chronically ill-appearing but calm and in no acute distress  HENT:  Head: Normocephalic and atraumatic.  Mucous membranes  appear dry  Eyes: Right eye exhibits no discharge. Left eye exhibits no discharge.  Neck: Neck supple.  Cardiovascular: Normal rate, regular rhythm, normal heart sounds and intact distal pulses. Exam reveals no gallop and no friction rub.  No murmur heard. Pulmonary/Chest: Effort normal and breath sounds normal. No respiratory distress.  Respirations equal and unlabored, patient able to speak in full sentences, lungs clear to auscultation bilaterally  Abdominal: Soft. Bowel sounds are normal. She exhibits no distension and no mass. There is no tenderness. There is no guarding.  Abdomen soft, nondistended, nontender to palpation in all quadrants without guarding or peritoneal signs  Neurological: She is alert. Coordination normal.  Contractures of all 4 extremities, sensation intact.  Skin: Skin is warm and dry. She is not diaphoretic.  Psychiatric: She has a normal mood and affect. Her behavior is normal.  Nursing note and vitals reviewed.    ED Treatments / Results  Labs (all labs ordered are listed, but only abnormal results are displayed) Labs Reviewed  CBC WITH DIFFERENTIAL/PLATELET - Abnormal; Notable for the following components:      Result Value   Neutro Abs 1.3 (*)    All other components within normal limits  COMPREHENSIVE METABOLIC PANEL - Abnormal; Notable for the following components:   Glucose, Bld 115 (*)    BUN 23 (*)    All other components within normal limits  URINALYSIS, ROUTINE W REFLEX MICROSCOPIC - Abnormal; Notable for the following components:   Hgb urine dipstick LARGE (*)    Protein, ur 30 (*)    Leukocytes, UA MODERATE (*)    RBC / HPF >50 (*)    WBC, UA >50 (*)    Bacteria, UA RARE (*)    All other components within normal limits  URINE CULTURE  I-STAT CG4 LACTIC ACID, ED  I-STAT CG4 LACTIC ACID, ED    EKG EKG Interpretation  Date/Time:  Sunday August 30 2018 12:27:13 EDT Ventricular Rate:  62 PR Interval:    QRS Duration: 95 QT  Interval:  441 QTC Calculation: 448 R Axis:   47 Text Interpretation:  Sinus rhythm Borderline T abnormalities, inferior leads no acute ischemic changes, no change from previous Confirmed by Arby Barrette [phone]) on 08/30/2018 3:22:25 PM   Radiology Dg Chest 2 View  Result Date: 08/30/2018 CLINICAL DATA:  Generalized weakness,.  Urinary tract infection EXAM: CHEST - 2 VIEW COMPARISON:  Chest CT 08/19/2018 FINDINGS: Anterior posterior cervical fusion noted. Fusion extends  in the upper thoracic spine. Normal cardiac silhouette. Low lung volumes. No effusion, infiltrate or pneumothorax. IMPRESSION: Mild central venous congestion low lung volumes. No evidence pneumonia. Electronically Signed   By: Genevive Bi M.D.   On: 08/30/2018 13:46    Procedures Procedures (including critical care time)  Medications Ordered in ED Medications  sodium chloride 0.9 % bolus 1,000 mL (1,000 mLs Intravenous New Bag/Given 08/30/18 1448)  sodium chloride 0.9 % bolus 1,000 mL (1,000 mLs Intravenous New Bag/Given 08/30/18 1238)     Initial Impression / Assessment and Plan / ED Course  I have reviewed the triage vital signs and the nursing notes.  Pertinent labs & imaging results that were available during my care of the patient were reviewed by me and considered in my medical decision making (see chart for details).  Patient presents for evaluation of generalized weakness and fatigue.  No fevers or chills, when EMS brought patient and she was noted to be hypotensive, at baseline patient's blood pressure is typically around 90-100 systolic.  On arrival here blood pressure is in the 80s.  Is not appear acutely symptomatic with this, afebrile here in the ED, history of recurrent UTIs will check basic lab work, chest x-ray, EKG, lactic acid urinalysis and urine culture.  Labs extremely reassuring, no leukocytosis and normal hemoglobin, no acute electrolyte derangements, renal and liver function are normal.   Lactic acid is negative.  Urinalysis shows large amounts of hemoglobin and leukocytes but rare bacteria, given patient's chronic indwelling catheter this is typical for the patient.  Without any other signs of infection such as fever, leukocytosis or elevated lactic acid I think this is less likely a urinary tract infection, and UA is more likely from colonization from chronic catheterization.  EKG without concerning ischemic changes.  Chest x-ray shows no evidence of pneumonia or other active cardiopulmonary disease.  Rehydrate with 2 L normal saline and then recheck blood pressure I suspect that given patient is asymptomatic with this that she would be stable for discharge back to nursing facility after rehydration.  At shift change care signed out to [facility] who will reassess after fluids.  Patient discussed with Dr. Donnald Garre, who saw patient as well and agrees with plan.   Final Clinical Impressions(s) / ED Diagnoses   Final diagnoses:  Generalized weakness  Dehydration    ED Discharge Orders    None       Dartha Lodge, [place] 08/30/18 1628    Arby Barrette, MD 09/09/18 0730

## 2018-08-30 NOTE — ED Triage Notes (Signed)
[facility] but resides in [facility]. Pt c/o generalized weakness and reports that urine has some  Red color and malorodorus. Pt has hx of UTI. Pt has hx of CP. Pt w/c is at facility per EMS church member brought it back to residence.    BP 60/p , CBG 107, RR 18 HR 68.

## 2018-08-30 NOTE — ED Provider Notes (Signed)
5:18 PM Patient sign-out from [facility] at shift change.   Pt here with low BP, ? UTI. Pending BP recheck after completion of fluids. Patient's baseline, based on recent ED visits and hospitalization around 100 systolic.  Patient currently completing IV fluids.  BP recheck, 101/69.  We will discharge home.  BP 101/69   Pulse (!) 56   Temp 98 F (36.7 C) (Oral)   Resp 11   LMP 04/05/2011   SpO2 100%     Renne Crigler, PA-C 08/30/18 1719    Arby Barrette, MD 09/09/18 [phone]

## 2018-09-01 LAB — URINE CULTURE: Culture: >=100000 — AB

## 2018-09-02 ENCOUNTER — Telehealth: Payer: Self-pay | Admitting: *Deleted

## 2018-09-02 NOTE — Progress Notes (Signed)
ED Antimicrobial Stewardship Positive Culture Follow Up   [name] [name] is an 42 y.o. female who presented to [facility] on 08/30/2018 with a chief complaint of weakness and red-colored, malodorous urine. Recently admitted for sepsis secondary to ESBL UTI on 9/21. Discharged to SNF with IV meropenem to complete a 7 day course. WBC wnl and patient afebrile in ED.    Chief Complaint  Patient presents with  . generalized weakness  . Recurrent UTI    Recent Results (from the past 720 hour(s))  Urine culture     Status: Abnormal   Collection Time: 08/10/18  6:54 AM  Result Value Ref Range Status   Specimen Description   Final    URINE, CATHETERIZED Performed at [facility], [address] [place], [place] [address]    Special Requests   Final    Normal Performed at [facility], [address] [place], [place] [address]    Culture MULTIPLE SPECIES PRESENT, SUGGEST RECOLLECTION (A)  Final   Report Status 08/11/2018 FINAL  Final  Urine culture     Status: Abnormal   Collection Time: 08/16/18  5:15 PM  Result Value Ref Range Status   Specimen Description   Final    URINE, CLEAN CATCH Performed at [facility], [address], [place], [place] [address]    Special Requests   Final    NONE Performed at [facility], [address], [place], [place] [address]    Culture MULTIPLE SPECIES PRESENT, SUGGEST RECOLLECTION (A)  Final   Report Status 08/17/2018 FINAL  Final  Urine C&S     Status: Abnormal   Collection Time: 08/19/18 12:56 PM  Result Value Ref Range Status   Specimen Description   Final    URINE, CLEAN CATCH Performed at [facility], [address], [place], [place] [address]    Special Requests   Final    NONE Performed at [facility], [address] [place], [place] [address]    Culture MULTIPLE SPECIES PRESENT, SUGGEST RECOLLECTION (A)  Final   Report Status 08/20/2018 FINAL  Final  Culture, blood (Routine x 2)     Status: None   Collection Time: 08/19/18 12:56 PM  Result Value Ref Range Status   Specimen Description   Final    BLOOD BLOOD LEFT FOREARM Performed at [facility], [address], [place], [place] [address]    Special Requests   Final    BOTTLES DRAWN AEROBIC ONLY Blood Culture results may not be optimal due to an inadequate volume of blood received in culture bottles Performed at [facility], [address], [place], [place] [address]    Culture   Final    NO GROWTH 5 DAYS Performed at [facility], [address] [place], [place] [address]    Report Status 08/24/2018 FINAL  Final  Culture, blood (Routine x 2)     Status: Abnormal   Collection Time: 08/19/18 12:56 PM  Result Value Ref Range Status   Specimen Description   Final    BLOOD RIGHT ANTECUBITAL Performed at [facility], [address] [place], [place] [address]    Special Requests   Final    BOTTLES DRAWN AEROBIC AND ANAEROBIC Blood Culture adequate volume Performed at [facility], [address] [place], [place] [address]    Culture  Setup Time   Final  GRAM POSITIVE COCCI AEROBIC BOTTLE ONLY CRITICAL RESULT CALLED TO, READ BACK BY AND VERIFIED WITH: WOFFORD PHARMD AT 1500 ON [id number] BY SJW    Culture (A)  Final    STAPHYLOCOCCUS SPECIES (COAGULASE NEGATIVE) THE SIGNIFICANCE OF ISOLATING THIS ORGANISM FROM A SINGLE SET OF BLOOD CULTURES WHEN MULTIPLE SETS ARE DRAWN IS UNCERTAIN. PLEASE NOTIFY THE MICROBIOLOGY DEPARTMENT WITHIN ONE WEEK IF SPECIATION AND SENSITIVITIES ARE REQUIRED. Performed at [facility], [address] [place], [place] [address]    Report Status 08/22/2018 FINAL  Final  Blood Culture ID Panel (Reflexed)     Status: Abnormal   Collection Time: 08/19/18 12:56 PM  Result Value Ref Range Status   Enterococcus species NOT DETECTED  NOT DETECTED Final   Listeria monocytogenes NOT DETECTED NOT DETECTED Final   Staphylococcus species DETECTED (A) NOT DETECTED Final    Comment: Methicillin (oxacillin) resistant coagulase negative staphylococcus. Possible blood culture contaminant (unless isolated from more than one blood culture draw or clinical case suggests pathogenicity). No antibiotic treatment is indicated for blood  culture contaminants. CRITICAL RESULT CALLED TO, READ BACK BY AND VERIFIED WITH: WOFFORD PHARMD AT 1500 ON [id number] BY SJW    Staphylococcus aureus NOT DETECTED NOT DETECTED Final   Methicillin resistance DETECTED (A) NOT DETECTED Final    Comment: CRITICAL RESULT CALLED TO, READ BACK BY AND VERIFIED WITH: WOFFORD PHARMD AT 1500 ON [id number] BY SJW    Streptococcus species NOT DETECTED NOT DETECTED Final   Streptococcus agalactiae NOT DETECTED NOT DETECTED Final   Streptococcus pneumoniae NOT DETECTED NOT DETECTED Final   Streptococcus pyogenes NOT DETECTED NOT DETECTED Final   Acinetobacter baumannii NOT DETECTED NOT DETECTED Final   Enterobacteriaceae species NOT DETECTED NOT DETECTED Final   Enterobacter cloacae complex NOT DETECTED NOT DETECTED Final   Escherichia coli NOT DETECTED NOT DETECTED Final   Klebsiella oxytoca NOT DETECTED NOT DETECTED Final   Klebsiella pneumoniae NOT DETECTED NOT DETECTED Final   Proteus species NOT DETECTED NOT DETECTED Final   Serratia marcescens NOT DETECTED NOT DETECTED Final   Haemophilus influenzae NOT DETECTED NOT DETECTED Final   Neisseria meningitidis NOT DETECTED NOT DETECTED Final   Pseudomonas aeruginosa NOT DETECTED NOT DETECTED Final   Candida albicans NOT DETECTED NOT DETECTED Final   Candida glabrata NOT DETECTED NOT DETECTED Final   Candida krusei NOT DETECTED NOT DETECTED Final   Candida parapsilosis NOT DETECTED NOT DETECTED Final   Candida tropicalis NOT DETECTED NOT DETECTED Final    Comment: Performed at [facility], [address]  [place], [place] [address]  MRSA PCR Screening     Status: None   Collection Time: 08/19/18  9:04 PM  Result Value Ref Range Status   MRSA by PCR NEGATIVE NEGATIVE Final    Comment:        The GeneXpert MRSA Assay (FDA approved for NASAL specimens only), is one component of a comprehensive MRSA colonization surveillance program. It is not intended to diagnose MRSA infection nor to guide or monitor treatment for MRSA infections. Performed at [facility], [address], [place], [place] [address]   Culture, Urine     Status: None   Collection Time: 08/21/18  7:53 AM  Result Value Ref Range Status   Specimen Description   Final    URINE, CATHETERIZED Performed at [facility], [address] [place], [place] [address]    Special Requests   Final    NONE Performed at  [facility], [address] [place], [place] [address]    Culture   Final    NO GROWTH Performed at [facility], [address], [place], [place] [address]    Report Status 08/22/2018 FINAL  Final  Urine culture     Status: Abnormal   Collection Time: 08/30/18 12:06 PM  Result Value Ref Range Status   Specimen Description   Final    URINE, RANDOM Performed at [facility], [address], [place], [place] [address]    Special Requests   Final    NONE Performed at [facility], [address] [place], [place] [address]    Culture (A)  Final    >=100,000 COLONIES/mL VANCOMYCIN RESISTANT ENTEROCOCCUS   Report Status 09/01/2018 FINAL  Final   Organism ID, Bacteria VANCOMYCIN RESISTANT ENTEROCOCCUS (A)  Final      Susceptibility   Vancomycin resistant enterococcus - MIC*    AMPICILLIN <=2 SENSITIVE Sensitive     LEVOFLOXACIN >=8 RESISTANT Resistant     NITROFURANTOIN <=16 SENSITIVE Sensitive     VANCOMYCIN >=32 RESISTANT Resistant     LINEZOLID 2 SENSITIVE Sensitive     * >=100,000 COLONIES/mL VANCOMYCIN  RESISTANT ENTEROCOCCUS   Without any other signs of infection and that culture and UA is more likely from colonization from chronic catheterization, continue without treatment.  ED Provider: Harolyn Rutherford, PA-C  Lawerance Bach 09/02/2018, 9:40 AM Clinical Pharmacist Monday - Friday phone -  [phone] Saturday - Sunday phone - [phone]

## 2018-09-02 NOTE — Telephone Encounter (Signed)
Post ED Visit - Positive Culture Follow-up  Culture report reviewed by antimicrobial stewardship pharmacist:  []  Enzo Bi, Pharm.D. []  Celedonio Miyamoto, Pharm.D., BCPS AQ-ID []  Garvin Fila, Pharm.D., BCPS []  Georgina Pillion, Pharm.D., BCPS []  [place], [address].D., BCPS, AAHIVP []  Estella Husk, Pharm.D., BCPS, AAHIVP []  Lysle Pearl, PharmD, BCPS []  Phillips Climes, PharmD, BCPS []  Agapito Games, PharmD, BCPS []  Verlan Friends, PharmD  Positive urine culture Reviewed by Harolyn Rutherford, PA-C and no further patient follow-up is required at this time.  Virl Axe [facility] 09/02/2018, 10:47 AM

## 2018-09-06 ENCOUNTER — Encounter (HOSPITAL_COMMUNITY): Payer: Self-pay | Admitting: Emergency Medicine

## 2018-09-06 ENCOUNTER — Other Ambulatory Visit: Payer: Self-pay

## 2018-09-06 ENCOUNTER — Emergency Department (HOSPITAL_COMMUNITY)
Admission: EM | Admit: 2018-09-06 | Discharge: 2018-09-06 | Disposition: A | Discharge status: Skilled Nursing Facility | Payer: Medicare Other | Attending: Emergency Medicine | Admitting: Emergency Medicine

## 2018-09-06 DIAGNOSIS — I10 Essential (primary) hypertension: Secondary | ICD-10-CM | POA: Diagnosis not present

## 2018-09-06 DIAGNOSIS — N390 Urinary tract infection, site not specified: Secondary | ICD-10-CM | POA: Insufficient documentation

## 2018-09-06 DIAGNOSIS — G800 Spastic quadriplegic cerebral palsy: Secondary | ICD-10-CM | POA: Diagnosis not present

## 2018-09-06 DIAGNOSIS — T839XXA Unspecified complication of genitourinary prosthetic device, implant and graft, initial encounter: Secondary | ICD-10-CM

## 2018-09-06 DIAGNOSIS — G803 Athetoid cerebral palsy: Secondary | ICD-10-CM | POA: Insufficient documentation

## 2018-09-06 DIAGNOSIS — Z7901 Long term (current) use of anticoagulants: Secondary | ICD-10-CM | POA: Insufficient documentation

## 2018-09-06 DIAGNOSIS — Z79899 Other long term (current) drug therapy: Secondary | ICD-10-CM | POA: Insufficient documentation

## 2018-09-06 DIAGNOSIS — Y733 Surgical instruments, materials and gastroenterology and urology devices (including sutures) associated with adverse incidents: Secondary | ICD-10-CM | POA: Insufficient documentation

## 2018-09-06 DIAGNOSIS — T83091A Other mechanical complication of indwelling urethral catheter, initial encounter: Secondary | ICD-10-CM | POA: Diagnosis not present

## 2018-09-06 LAB — URINALYSIS, MICROSCOPIC (REFLEX): SQUAMOUS EPITHELIAL / LPF: NONE SEEN (ref 0–5)

## 2018-09-06 LAB — URINALYSIS, ROUTINE W REFLEX MICROSCOPIC

## 2018-09-06 MED ORDER — CEPHALEXIN 500 MG PO CAPS: 500.0000 mg | ORAL_CAPSULE | Freq: Three times a day (TID) | ORAL | 0 refills | Status: DC

## 2018-09-06 NOTE — ED Notes (Signed)
Bladder scan 321 ML

## 2018-09-06 NOTE — ED Notes (Signed)
30 ml bladder scan

## 2018-09-06 NOTE — Discharge Instructions (Signed)
We have exchanged your foley.  Your urine shows sign of infection.  Take antibiotic as prescribed.  Follow up with your doctor for further care.

## 2018-09-06 NOTE — ED Notes (Signed)
Bed: ZO10 Expected date: 09/06/18 Expected time: 1:10 PM Means of arrival: Ambulance Comments: Leaking cath from [place]

## 2018-09-06 NOTE — ED Notes (Signed)
[facility] called for pt. Attempted to give report to facility and no answer and no answering machine available.

## 2018-09-06 NOTE — ED Triage Notes (Signed)
Pt c/o pain at urinary catheter site x 2 days.

## 2018-09-06 NOTE — ED Provider Notes (Signed)
[facility]-EMERGENCY DEPT Provider Note   CSN: [id number] Arrival date & time: 09/06/18  1316     History   Chief Complaint Chief Complaint  Patient presents with  . pain at catheter    HPI [name] [name] is a 42 y.o. female.  The history is provided by the patient and medical records. No language interpreter was used.   [name] [name] is a 42 y.o. female  with a PMH of CP wheelchair-bound with chronic indwelling catheter who presents to the Emergency Department complaining of "bladder spasms" which are very painful beganing today.  Per nursing facility, she has had leaking from around her catheter.  Patient states that she has not had as much come through her catheter bag as it normally does in a day.  No fevers.  No nausea, vomiting or back pain.   Past Medical History:  Diagnosis Date  . Abdominal pain 01/02/2012   Overview:  Overview:  Per Previous pcp note- Dr. Ta--Pt has history of endometriosis and had DUB in 2011.  She She had u/s that showed normal endometrial stripe.  She had endometrial bx that was wnl.  She has a lot of abd cramping every month.  This cramping was sometimes not relieved with hydrocodone.  She was referred to [facility] for IUD placement to help endometriosis and abd cramping.  [name]  . Abdominal pain 01/02/2012   Overview:  Overview:  Per Previous pcp note- Dr. Ta--Pt has history of endometriosis and had DUB in 2011.  She She had u/s that showed normal endometrial stripe.  She had endometrial bx that was wnl.  She has a lot of abd cramping every month.  This cramping was sometimes not relieved with hydrocodone.  She was referred to [facility] for IUD placement to help endometriosis and abd cramping.  [name] was placed 04/30/11 by [facility].  Pt still complains of abd cramping, which I am treating with hydrocodone and ultram. Pt reports some initial relief of pain from IUD, but states it has now returned back to similar to previous cramping.  Gave patient a shot of Toradol 60 mg IM today-since positive exacerbation of abdominal cramping during past 2 weeks. Physical exam reassuring. Patient may need to return to [facility] for further workup since she is plugged in with the GYN providers there for further treatment options regarding endometriosis and abdominal cramping. The dysfunctional uterine bleeding now well contr  . Acute cystitis with hematuria   . Acute cystitis without hematuria   . Acute respiratory failure with hypoxia ([name])   . Anemia   . Cellulitis 12/22/2014  . Cerebral palsy ([name])    Spastic Cerebral palsy, mentally intact  . Contracture, joint, multiple sites    Electric wheelchair, uses left hand to operate chair.   . Depression   . Disease of female genital organs 10/24/2010   Overview:  Overview:  Pt has history of endometriosis and had DUB in 2011.  She had u/s that showed normal endometrial stripe.  She had endometrial bx that was wnl.  She has a lot of abd cramping every month.  This cramping was sometimes not relieved with hydrocodone.  She was referred to [facility] for IUD placement to help endometriosis and abd cramping.  [name] was placed 04/30/11 by [facility].  Pt still complains of abd cramping, which was treated with hydrocodone and ultram.   Last seen at [facility] by OB/GYN 12/24/11, given Depo Lupron injection.  Ordering CT scan to r/o additional cause  of abdominal pain (pt with known endometriosis and will not tolerate vaginal U/S, pain not improved s/p [name] or with Lupron injection-- only caused hot flashes and amenorrhea, no pain relief).  Also made referral to pain clinic for better pain control.  Do not think hysterectomy would help with pain, and concern for contamination of baclofen pump if did proceed with surgery.    8/5/13[name]: Pt was seen by   . [name]   . Dysarthria   . Dyspnea and respiratory abnormality 02/04/2011   Overview:  Overview:  Pt has history of  recurrent PE and is on chronic coumadin.  Pt has been complaining of dyspnea that she describes as chest tightness.  She has been to the ER multiple times for this.  Usually she gets CTA and serial CE.  She has had at least 6 CTA in a period of since 2011.  She also has an O2 requirement. It is unclear why pt feels dyspneic, complains of chest tightness, or has O2 requirement.  I thought that her complaint may be from deconditioning and referred her to PT.  Unfortunately Medicaid only pays for 4 PT visits.  Pt should be doing these exercises at home, but she has not.  I have referred her to [facility], and she has been seen by [name].  He did not understand the etiology of her dyspnea and O2 requirement.  He will continue to follow her.   Last Assessment & Plan:  Pt presents with concerns for chest tightness and dyspnea.  She was seen in ED yesterday and discharged home.  While there she had a CT chest 04/20/11 showed 1.  Multifocal degradation as detailed above.  N  . Endometriosis   . GERD (gastroesophageal reflux disease)   . Gram positive sepsis ([name]) 10/24/2014  . Gross hematuria 03/12/2013   Overview:  Overview:  status post replacement of suprapubic tube 03/04/2013  Last Assessment & Plan:  Recent urine culture collected in ED on 5/4 did not result in significant growth. Moreover, patient has been asymptomatic with normal WBC, no fever and no true symptoms making symptomatic UTI less likely.  Will continue to monitor for any sign of infection.  Patient's xarelto had been held during her recent hospitalization due to hematuria. This has since been re-started. Monitor bleeding.    [name] Kitchen HCAP (healthcare-associated pneumonia) 10/25/2014  . HCAP (healthcare-associated pneumonia) 11/24/2017  . History of anticoagulant therapy 10/21/2012  . History of endometriosis 10/2012   OBGYN [facility]: Laparoscopic Endometrial Ablation, TVH,  . History of recurrent UTIs   . Hypertension   . HYPERTENSION, BENIGN 01/31/2010    Qualifier: Diagnosis of  By: [name], MD, [name] Incontinent of feces 03/11/2016  . Infantile cerebral palsy ([name]) 01/29/2007   Overview:  She has spastic CP.  She is mentally intact.  She is wheelchair bound.  She is wheelchair bound for ambulation      . Intracervical pessary 05/07/2011   Overview:  Overview:  Placed by [facility] GYN 04/30/11 to treat DUB and Endometriosis.  She is seen at GYN clinic at [facility] but was considered a poor surgical candidate and referred her to [facility].  For DUB she had pelvic ultrasound that showed thin stripe and she had endometrial Bx by Dr Jennette Kettle in GYN clinic, which was negative.     . Macroscopic hematuria 03/10/2013   status post replacement of suprapubic tube 03/04/2013   . Migraine   .  OCD (obsessive compulsive disorder)   . Parapneumonic effusion 10/25/2014  . Presence of intrathecal baclofen pump 03/07/2014   R LQ. Insertion T10.    [name] Kitchen Psychiatric illness 10/31/2014  . Pulmonary embolism ([name])    Lifetime Coumadin  . Pulmonary embolism ([name]) 2011, 01/2011   Will be on lifetime coumadin  . Recurrent pulmonary embolism ([name]) 05/03/2011   Pt has history of recurrent PE and is on chronic coumadin.  Pt has been complaining of dyspnea that she describes as chest tightness.  She has been to the ER multiple times for this.  Usually she gets CTA and serial CE.  She has had at least 6 CTA in a period of since 2011.  She also has an O2 requirement. It is unclear why pt feels dyspneic, complains of chest tightness, or has O2 requirement.  I thought that her complaint may be from deconditioning and referred her to PT.  Unfortunately Medicaid only pays for 4 PT visits.  Pt should be doing these exercises at home, but she has not.  I have referred her to [facility], and she has been seen by [name].  He did not understand the etiology of her dyspnea and O2 requirement.  He will continue to follow her.     . Seborrheic keratosis 01/18/2011  . Seizures ([name]) 02/12/2016  .  Spasticity 01/05/2014  . Transient alteration of awareness, recurrent 09/08/2006   Recurrent episodes where Jaasia  loses contact with her world and that have been studied thoroughly and appear nonepileptic in nature per Dr Terrace Arabia (neurology) The patient has had episodes starting in 2007 that initially began 1-4 times a day during which time she would have periods of unawareness followed by confusion. This continuous video EEG monitoring performed from October 8-12, 2007. Had   . Urethra dilated and patulous 07/03/2015   Patulous, Dilated Urethra. 5mL balloon on a 22-Fr catheter hoping this would prevent the bladder spasm from pushing the balloon down the urethra (Dr Logan Bores, Urology [facility], July 2016)     Patient Active Problem List   Diagnosis Date Noted  . Connective tissue disorder ([name]) 08/07/2018  . Migraine without aura and without status migrainosus, not intractable 06/26/2018  . UTI (urinary tract infection) 05/25/2018  . Headache 05/25/2018  . Incontinent of feces 03/11/2016  . Chronic anticoagulation 03/11/2016  . Involuntary movements 02/22/2016  . Seizures ([name]) 02/12/2016  . Dyspnea 02/12/2016  . Cerebral palsy, quadriplegic ([name]) 02/06/2016  . Swelling of extremity 01/13/2016  . Fever   . Pressure ulcer 10/09/2015  . Status post insertion of inferior vena caval filter 09/28/2015  . Absence of bladder continence 08/25/2015  . Person living in residential institution 06/14/2015  . Pulmonary hypertension ([name])   . Anemia of chronic disease   . Spasticity 03/15/2015  . Constipation, chronic 10/10/2014  . Athetoid cerebral palsy ([name]) 04/13/2014  . Dystonia 04/12/2014  . UTI (lower urinary tract infection) 03/16/2014  . Presence of intrathecal baclofen pump 03/07/2014  . Dental caries 07/18/2013  . Multiple thyroid nodules 05/20/2013  . Osteoarthrosis 04/07/2013  . [name]   . Dysarthria   . GERD (gastroesophageal reflux disease)   . Neurogenic  bladder 02/05/2013  . Anxiety state 10/21/2012  . Chronic pain syndrome 06/17/2012  . Recurrent pulmonary embolism ([name]) 05/03/2011  . Pulmonary embolism with infarction ([name]) 05/03/2011  . Chronic suprapubic catheter ([name]) 03/15/2011  . Disease of female genital organs 10/24/2010  . HYPERTENSION, BENIGN 01/31/2010  . CP (cerebral palsy),  spastic ([name]) 12/27/2008  . Contracted, joint, multiple sites 12/27/2008  . Major depressive disorder, recurrent episode ([name]) 01/29/2007  . Obsessive Compulsive Disorder 01/29/2007  . Transient alteration of awareness, recurrent 09/08/2006    Past Surgical History:  Procedure Laterality Date  . ABDOMINAL HYSTERECTOMY    . APPENDECTOMY    . BACLOFEN PUMP REFILL     x 3 times  . CARPAL TUNNEL RELEASE  08/2008   Dr Teressa Senter  . CESAREAN SECTION     x 2  . CHOLECYSTECTOMY  10/14/2015   Procedure: LAPAROSCOPIC CHOLECYSTECTOMY;  Surgeon: Violeta Gelinas, MD;  Location: [facility] OR;  Service: General;;  . COLPOSCOPY  06/2000  . INTRAUTERINE DEVICE INSERTION  04/30/11   Inserted by [facility] GYN for endometriosis  . LAPAROSCOPIC ASSISTED VAGINAL HYSTERECTOMY  10/27/2012  . MULTIPLE EXTRACTIONS WITH ALVEOLOPLASTY Bilateral 07/19/2013   Procedure: EXTRACTIONS #4, 4,09,81,19;  Surgeon: Francene Finders, DDS;  Location: [facility] OR;  Service: Oral Surgery;  Laterality: Bilateral;  . NEUROMA SURGERY Left    anterior and posterior intersosseous  . PAIN PUMP IMPLANTATION N/A 03/07/2014   Procedure: baclofen pump revision/replacement and Catheter connection replacement;  Surgeon: Cristi Loron, MD;  Location: [facility] OR[facility];  Service: Neurosurgery;  Laterality: N/A;  baclofen pump revision/replacement and Catheter connection replacement  . PROGRAMABLE BACLOFEN PUMP REVISION  03/17/14   Battery Replacement   . TUBAL LIGATION  2003  . URETHRA SURGERY    . WRIST SURGERY  06/2010   Dr Dierdre Searles, hand surgeon, [name]     OB History   None      Home Medications    Prior  to Admission medications   Medication Sig Start Date End Date Taking? Authorizing Provider  acetaminophen (TYLENOL 8 HOUR) 650 MG CR tablet Take 1 tablet (650 mg total) by mouth every 8 (eight) hours as needed. Patient taking differently: Take 650 mg by mouth every 8 (eight) hours as needed for pain.  07/08/18   Derwood Kaplan, MD  amantadine (SYMMETREL) 100 MG capsule Take 100 mg by mouth daily.     [provider]  baclofen (LIORESAL) 20 MG tablet Take 20 mg by mouth 4 (four) times daily. For bladder spasms    [provider]  bisacodyl (DULCOLAX) 10 MG suppository Place 10 mg rectally 2 (two) times daily as needed for moderate constipation.     [provider]  Buprenorphine HCl (BELBUCA) 450 MCG FILM Place 450 mcg inside cheek every 12 (twelve) hours. 08/22/18   Amin, Loura Halt, MD  busPIRone (BUSPAR) 5 MG tablet Take 5 mg by mouth 3 (three) times daily.    [provider]  butalbital-acetaminophen-caffeine (FIORICET, ESGIC) 50-325-40 MG tablet Take 2 tablets by mouth every 4 (four) hours as needed for headache (not controlled by Tylenol). 05/29/18   Darlin Drop, DO  clonazePAM (KLONOPIN) 0.5 MG tablet Take 1.5 tablets (0.75 mg total) by mouth 3 (three) times daily. 11/27/17   Narda Bonds, MD  feeding supplement, ENSURE ENLIVE, (ENSURE ENLIVE) LIQD Take 237 mLs by mouth 2 (two) times daily between meals. 05/30/18   Darlin Drop, DO  ferrous sulfate 325 (65 FE) MG tablet Take 325 mg by mouth daily.     [provider]  FLUoxetine (PROZAC) 40 MG capsule Take 80 mg by mouth daily.    [provider]  lactulose (CHRONULAC) 10 GM/15ML solution Take 20 g by mouth daily as needed for mild constipation or moderate constipation.    [provider]  lamoTRIgine (LAMICTAL) 100 MG tablet Take 100 mg by mouth at bedtime.    [provider]  linaclotide (LINZESS) 145 MCG CAPS capsule Take 145 mcg by mouth daily before breakfast.     [provider]  lubiprostone (AMITIZA) 24 MCG capsule Take 1 capsule (24 mcg total) by mouth 2 (two) times daily with a meal. Patient not taking: Reported on 08/19/2018 01/01/17   Esterwood, [name] S, PA-C  Melatonin 5 MG TABS Take 5 mg by mouth at bedtime.     [provider]  meropenem 1 g in sodium chloride 0.9 % 100 mL Inject 1 g into the vein every 8 (eight) hours. 08/21/18   Amin, Loura Halt, MD  Multiple Vitamins-Minerals (CERTA-VITE PO) Take 1 tablet by mouth daily.    [provider]  oxybutynin (DITROPAN) 5 MG tablet Take 5 mg by mouth 4 (four) times daily.  02/20/16   [provider]  oxyCODONE (ROXICODONE) 5 MG immediate release tablet Take 5 mg by mouth every 4 (four) hours as needed for moderate pain or severe pain.    [provider]  polyethylene glycol powder (GLYCOLAX/MIRALAX) powder Take 1  1/2 dose  daily in 12 ounces of fluid every day. Patient taking differently: Take 17 g by mouth 2 (two) times daily.  01/01/17   Esterwood, [name] S, PA-C  QUEtiapine (SEROQUEL) 50 MG tablet Take 150 mg by mouth at bedtime.    [provider]  rivaroxaban (XARELTO) 10 MG TABS tablet Take 10 mg by mouth daily.    [provider]  tiZANidine (ZANAFLEX) 4 MG capsule Take 4 mg by mouth 2 (two) times daily.     [provider]  topiramate (TOPAMAX) 25 MG tablet Take 3 tablets (75mg ) at bedtime for 2 weeks, then take 4 tablets (100mg ) at bedtime Patient taking differently: Take 75-100 mg by mouth See admin instructions. Take 75 mg by mouth at bedtime for 2 weeks until 08/21/18. Then start 100 mg by mouth at bedtime on 08/22/18 08/07/18   Elveria Rising, NP    Family History Family History  Problem Relation Age of Onset  . Asthma Father   . Colon cancer Maternal Grandmother        Died in her 73's  . Breast cancer Paternal Grandmother        Died in her 58's  . Heart attack Maternal Grandfather        Died in his 93's  .  Alzheimer's disease Paternal Grandfather        Died in his 13's  . Breast cancer Mother   . Stomach cancer Neg Hx     Social History Social History   Tobacco Use  . Smoking status: Never Smoker  . Smokeless tobacco: Never Used  Substance Use Topics  . Alcohol use: Yes    Alcohol/week: 1.0 standard drinks    Types: 1 Glasses of wine per week    Comment: Drinks alcohol once a month.  . Drug use: No     Allergies   Morphine   Review of Systems Review of Systems  Gastrointestinal: Positive for abdominal pain.  Genitourinary: Positive for difficulty urinating.  All other systems reviewed and are negative.    Physical Exam Updated Vital Signs BP 112/83 (BP Location: Left Arm)   Pulse 85   Temp 98 F (36.7 C) (Oral)   Resp 18   LMP 04/05/2011   SpO2 95%   Physical Exam  Constitutional: She is  oriented to person, place, and time. She appears well-developed and well-nourished. No distress.  HENT:  Head: Normocephalic and atraumatic.  Cardiovascular: Normal rate, regular rhythm and normal heart sounds.  No murmur heard. Pulmonary/Chest: Effort normal and breath sounds normal. No respiratory distress.  Abdominal: Soft. She exhibits no distension.  Suprapubic catheter in place. Pads around the catheter site damp, likely urine. Diffuse tenderness around cath site as well. No erythema.   Neurological: She is alert and oriented to person, place, and time.  Skin: Skin is warm and dry.  Nursing note and vitals reviewed.    ED Treatments / Results  Labs (all labs ordered are listed, but only abnormal results are displayed) Labs Reviewed  URINE CULTURE  URINALYSIS, ROUTINE W REFLEX MICROSCOPIC    EKG None  Radiology No results found.  Procedures Insert Suprapubic catheter Date/Time: 09/06/2018 3:42 PM Performed by: Ward, Chase Picket, PA-C Authorized by: Ward, Chase Picket, PA-C  Consent: The procedure was performed in an emergent situation. Verbal consent  obtained. Risks and benefits: risks, benefits and alternatives were discussed Consent given by: patient Patient identity confirmed: verbally with patient and arm band Time out: Immediately prior to procedure a "time out" was called to verify the correct patient, procedure, equipment, support staff and site/side marked as required. Preparation: Patient was prepped and draped in the usual sterile fashion. Patient tolerance: Patient tolerated the procedure well with no immediate complications Comments: 20 French catheter that was in place was removed. Large volume of urine released. New 20 French tube placed without resistance. Urine immediately drained into catheter bag. Patient noted improvement of suprapubic pain. Tolerated procedure well.     (including critical care time)  Medications Ordered in ED Medications - No data to display   Initial Impression / Assessment and Plan / ED Course  I have reviewed the triage vital signs and the nursing notes.  Pertinent labs & imaging results that were available during my care of the patient were reviewed by me and considered in my medical decision making (see chart for details).    Kanija Remmel is a 42 y.o. female who presents to ED for suprapubic pain and urine leaking from around the catheter site. On exam, patient appears uncomfortable. She does not appear toxic. She is afebrile, hemodynamically stable. Her current catheter was clogged. Initial bladder scan of 321. I removed existing catheter and inserted a new 20 [place] using sterile technique. Patient tolerated this procedure well and had immediate improvement in her abdominal discomfort after urine was released.  Will get a urine sample from clean urine from new catheter.  UA pending at shift change. Care assumed by oncoming provider PA Laveda Norman. Case discussed, plan agreed upon. Will follow up on urinalysis with likely discharge back to facility.    Patient discussed with Dr. Fredderick Phenix who agrees  with treatment plan.    Final Clinical Impressions(s) / ED Diagnoses   Final diagnoses:  None    ED Discharge Orders    None       Ward, Chase Picket, PA-C 09/06/18 1547    Rolan Bucco, MD 09/06/18 [phone]

## 2018-09-06 NOTE — ED Notes (Signed)
Suprapubic catheter changed with 20FR and 5cc balloon. Pt tolerated the procedure well. Prior to catheter change, urine leaked from catheter site, mucous and pus noted at catheter site and on catheter that was removed. Repeat bladder scan shows 30 ml of urine in bladder. Urine to be collected and sent to lab.

## 2018-09-06 NOTE — ED Notes (Signed)
Supplies at bedside.

## 2018-09-07 ENCOUNTER — Other Ambulatory Visit: Payer: Self-pay | Admitting: *Deleted

## 2018-09-07 DIAGNOSIS — M542 Cervicalgia: Secondary | ICD-10-CM | POA: Diagnosis not present

## 2018-09-07 DIAGNOSIS — Z79899 Other long term (current) drug therapy: Secondary | ICD-10-CM | POA: Diagnosis not present

## 2018-09-07 DIAGNOSIS — N318 Other neuromuscular dysfunction of bladder: Secondary | ICD-10-CM | POA: Diagnosis not present

## 2018-09-07 DIAGNOSIS — G809 Cerebral palsy, unspecified: Secondary | ICD-10-CM | POA: Diagnosis not present

## 2018-09-07 DIAGNOSIS — N390 Urinary tract infection, site not specified: Secondary | ICD-10-CM | POA: Diagnosis not present

## 2018-09-07 NOTE — Patient Outreach (Signed)
[facility]) Care Management  09/07/2018  [name] [name] May 21, 1976 [id number]   Telephone Screen  Referral Date:  09/04/2018 Referral Source:  [facility] ED Census Reason for Referral:  6 or more ED visits in the past 6 months Insurance:  Medicare   Outreach Attempt:  Received [facility] ED high utilization referral.  Per chart review patient resident of Immunologist.  RN Health Coach contacted [facility] and spoke with [name] [name] who confirms patient is a resident.  Plan:  RN Health Coach will close case based on patient residing in Skilled Nursing Facility.  [name] Lerner RN [facility] Care Management  RN Health Coach [phone] [name]

## 2018-09-08 LAB — URINE CULTURE: Culture: >=100000 — AB

## 2018-09-09 ENCOUNTER — Telehealth: Payer: Self-pay | Admitting: Emergency Medicine

## 2018-09-09 NOTE — Telephone Encounter (Signed)
Post ED Visit - Positive Culture Follow-up  Culture report reviewed by antimicrobial stewardship pharmacist:  []  Enzo Bi, Pharm.D. []  Celedonio Miyamoto, Pharm.D., BCPS AQ-ID []  Garvin Fila, Pharm.D., BCPS []  Georgina Pillion, [address].D., BCPS []  [place], [address].D., BCPS, AAHIVP []  Estella Husk, Pharm.D., BCPS, AAHIVP []  Lysle Pearl, PharmD, BCPS []  Phillips Climes, PharmD, BCPS []  Agapito Games, PharmD, BCPS []  Verlan Friends, PharmD  Positive urine culture Treated with cephalexin, organism sensitive to the same and no further patient follow-up is required at this time.  Berle Mull 09/09/2018, 10:44 AM

## 2018-09-10 DIAGNOSIS — F4322 Adjustment disorder with anxiety: Secondary | ICD-10-CM | POA: Diagnosis not present

## 2018-09-17 ENCOUNTER — Ambulatory Visit (INDEPENDENT_AMBULATORY_CARE_PROVIDER_SITE_OTHER): Payer: Medicare Other | Admitting: Family

## 2018-09-17 ENCOUNTER — Encounter (INDEPENDENT_AMBULATORY_CARE_PROVIDER_SITE_OTHER): Payer: Self-pay | Admitting: Family

## 2018-09-17 VITALS — BP 130/80 | HR 76

## 2018-09-17 DIAGNOSIS — G808 Other cerebral palsy: Secondary | ICD-10-CM | POA: Diagnosis not present

## 2018-09-17 DIAGNOSIS — R471 Dysarthria and anarthria: Secondary | ICD-10-CM

## 2018-09-17 DIAGNOSIS — G803 Athetoid cerebral palsy: Secondary | ICD-10-CM | POA: Diagnosis not present

## 2018-09-17 DIAGNOSIS — F411 Generalized anxiety disorder: Secondary | ICD-10-CM

## 2018-09-17 DIAGNOSIS — R252 Cramp and spasm: Secondary | ICD-10-CM | POA: Diagnosis not present

## 2018-09-17 DIAGNOSIS — G249 Dystonia, unspecified: Secondary | ICD-10-CM | POA: Diagnosis not present

## 2018-09-17 DIAGNOSIS — G43009 Migraine without aura, not intractable, without status migrainosus: Secondary | ICD-10-CM

## 2018-09-17 DIAGNOSIS — G801 Spastic diplegic cerebral palsy: Secondary | ICD-10-CM | POA: Diagnosis not present

## 2018-09-17 DIAGNOSIS — M2450 Contracture, unspecified joint: Secondary | ICD-10-CM

## 2018-09-17 DIAGNOSIS — Z593 Problems related to living in residential institution: Secondary | ICD-10-CM

## 2018-09-17 MED ORDER — TOPIRAMATE 100 MG PO TABS: ORAL_TABLET | ORAL | 3 refills | Status: DC

## 2018-09-17 NOTE — Patient Instructions (Signed)
Thank you for coming in today.   Instructions for you until your next appointment are as follows: 1. Continue taking Topiramate 100mg  at bedtime. I have given you a new prescription today for that. 2. Remember that it is important to drink plenty of water while you take this medication. Try to drink more water and fewer drinks with sugar.  3. Continue to try to limit the amount of pain medication that you take.  4. Remember that it is important for you to be as active as possible each day. This will help with your muscles and your mood. 5. Talk with your counselor and your psychiatrist about how that you are feeling down because of your physical condition and living at [place]. 5. Please plan to return for follow up in 3 months or sooner if needed.

## 2018-09-17 NOTE — Progress Notes (Signed)
Patient: [name] [name] MRN: [id number] Sex: female DOB: November 03, 1976  Provider: Elveria Rising, NP Location of Care: [facility]  Note type: Routine return visit  History of Present Illness: Referral Source: Melanie March, MD History from: patient and [facility] chart Chief Complaint: Headaches  [name] [name] is a 42 y.o. 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 She has an implanted pump to deliver intrathecal Baclofen and sees Dr Sharene Skeans for that. She was last seen by me on August 07, 2018. She is taking and tolerating Topiramate for migraine prevention, and this has worked well to reduce the frequency and severity of migraine headaches. [name] [name] tells me today that she has felt unusually tired for about the last 3-4 weeks and has been taking naps during the day, which she says is not typical for her. She said that she was treated for a UTI a couple of weeks ago and also admits that she is feeling depressed about her chronic health problems and about living at [facility] nursing facility. [name] [name] tells me that she had blood tests that indicate that she may have Lupus but that she has not had other testing to confirm it. She is very worried about having another chronic condition such as Lupus.   [name] [name] has been otherwise healthy since she was last seen and has no other health concerns today other than previously mentioned.  Review of Systems: Please see the HPI for neurologic and other pertinent review of systems. Otherwise, all other systems were reviewed and were negative.    Past Medical History:  Diagnosis Date  . Abdominal pain 01/02/2012   Overview:  Overview:  Per Previous pcp note- Dr. Ta--Pt has history of endometriosis and had DUB in 2011.  She She had u/s that showed normal endometrial stripe.  She had endometrial bx that was wnl.  She has a lot of abd cramping every month.  This cramping was sometimes not relieved with  hydrocodone.  She was referred to [facility] for IUD placement to help endometriosis and abd cramping.  [name]  . Abdominal pain 01/02/2012   Overview:  Overview:  Per Previous pcp note- Dr. Ta--Pt has history of endometriosis and had DUB in 2011.  She She had u/s that showed normal endometrial stripe.  She had endometrial bx that was wnl.  She has a lot of abd cramping every month.  This cramping was sometimes not relieved with hydrocodone.  She was referred to [facility] for IUD placement to help endometriosis and abd cramping.  [name] was placed 04/30/11 by [facility].  Pt still complains of abd cramping, which I am treating with hydrocodone and ultram. Pt reports some initial relief of pain from IUD, but states it has now returned back to similar to previous cramping. Gave patient a shot of Toradol 60 mg IM today-since positive exacerbation of abdominal cramping during past 2 weeks. Physical exam reassuring. Patient may need to return to [facility] for further workup since she is plugged in with the GYN providers there for further treatment options regarding endometriosis and abdominal cramping. The dysfunctional uterine bleeding now well contr  . Acute cystitis with hematuria   . Acute cystitis without hematuria   . Acute respiratory failure with hypoxia ([name])   . Anemia   . Cellulitis 12/22/2014  . Cerebral palsy ([name])    Spastic Cerebral palsy, mentally intact  . Contracture, joint, multiple sites    Electric wheelchair, uses left hand to operate chair.   [name] Kitchen  Depression   . Disease of female genital organs 10/24/2010   Overview:  Overview:  Pt has history of endometriosis and had DUB in 2011.  She had u/s that showed normal endometrial stripe.  She had endometrial bx that was wnl.  She has a lot of abd cramping every month.  This cramping was sometimes not relieved with hydrocodone.  She was referred to [facility] for IUD placement to help endometriosis and abd cramping.  [name] was placed 04/30/11 by  [facility].  Pt still complains of abd cramping, which was treated with hydrocodone and ultram.   Last seen at [facility] by OB/GYN 12/24/11, given Depo Lupron injection.  Ordering CT scan to r/o additional cause of abdominal pain (pt with known endometriosis and will not tolerate vaginal U/S, pain not improved s/p [name] or with Lupron injection-- only caused hot flashes and amenorrhea, no pain relief).  Also made referral to pain clinic for better pain control.  Do not think hysterectomy would help with pain, and concern for contamination of baclofen pump if did proceed with surgery.    8/5/13[name]: Pt was seen by   . [name]   . Dysarthria   . Dyspnea and respiratory abnormality 02/04/2011   Overview:  Overview:  Pt has history of recurrent PE and is on chronic coumadin.  Pt has been complaining of dyspnea that she describes as chest tightness.  She has been to the ER multiple times for this.  Usually she gets CTA and serial CE.  She has had at least 6 CTA in a period of since 2011.  She also has an O2 requirement. It is unclear why pt feels dyspneic, complains of chest tightness, or has O2 requirement.  I thought that her complaint may be from deconditioning and referred her to PT.  Unfortunately Medicaid only pays for 4 PT visits.  Pt should be doing these exercises at home, but she has not.  I have referred her to [facility], and she has been seen by [name].  He did not understand the etiology of her dyspnea and O2 requirement.  He will continue to follow her.   Last Assessment & Plan:  Pt presents with concerns for chest tightness and dyspnea.  She was seen in ED yesterday and discharged home.  While there she had a CT chest 04/20/11 showed 1.  Multifocal degradation as detailed above.  N  . Endometriosis   . GERD (gastroesophageal reflux disease)   . Gram positive sepsis ([name]) 10/24/2014  . Gross hematuria 03/12/2013   Overview:  Overview:  status post replacement of  suprapubic tube 03/04/2013  Last Assessment & Plan:  Recent urine culture collected in ED on 5/4 did not result in significant growth. Moreover, patient has been asymptomatic with normal WBC, no fever and no true symptoms making symptomatic UTI less likely.  Will continue to monitor for any sign of infection.  Patient's xarelto had been held during her recent hospitalization due to hematuria. This has since been re-started. Monitor bleeding.    [name] Kitchen HCAP (healthcare-associated pneumonia) 10/25/2014  . HCAP (healthcare-associated pneumonia) 11/24/2017  . History of anticoagulant therapy 10/21/2012  . History of endometriosis 10/2012   OBGYN [facility]: Laparoscopic Endometrial Ablation, TVH,  . History of recurrent UTIs   . Hypertension   . HYPERTENSION, BENIGN 01/31/2010   Qualifier: Diagnosis of  By: [name], MD, [name] Incontinent of feces 03/11/2016  . Infantile cerebral palsy ([name]) 01/29/2007  Overview:  She has spastic CP.  She is mentally intact.  She is wheelchair bound.  She is wheelchair bound for ambulation      . Intracervical pessary 05/07/2011   Overview:  Overview:  Placed by [facility] 04/30/11 to treat DUB and Endometriosis.  She is seen at GYN clinic at [facility] but was considered a poor surgical candidate and referred her to [facility].  For DUB she had pelvic ultrasound that showed thin stripe and she had endometrial Bx by Dr Jennette Kettle in GYN clinic, which was negative.     . Macroscopic hematuria 03/10/2013   status post replacement of suprapubic tube 03/04/2013   . Migraine   . OCD (obsessive compulsive disorder)   . Parapneumonic effusion 10/25/2014  . Presence of intrathecal baclofen pump 03/07/2014   R LQ. Insertion T10.    [name] Kitchen Psychiatric illness 10/31/2014  . Pulmonary embolism ([name])    Lifetime Coumadin  . Pulmonary embolism ([name]) 2011, 01/2011   Will be on lifetime coumadin  . Recurrent pulmonary embolism ([name]) 05/03/2011   Pt has history of recurrent PE and is on chronic  coumadin.  Pt has been complaining of dyspnea that she describes as chest tightness.  She has been to the ER multiple times for this.  Usually she gets CTA and serial CE.  She has had at least 6 CTA in a period of since 2011.  She also has an O2 requirement. It is unclear why pt feels dyspneic, complains of chest tightness, or has O2 requirement.  I thought that her complaint may be from deconditioning and referred her to PT.  Unfortunately Medicaid only pays for 4 PT visits.  Pt should be doing these exercises at home, but she has not.  I have referred her to [facility], and she has been seen by [name].  He did not understand the etiology of her dyspnea and O2 requirement.  He will continue to follow her.     . Seborrheic keratosis 01/18/2011  . Seizures ([name]) 02/12/2016  . Spasticity 01/05/2014  . Transient alteration of awareness, recurrent 09/08/2006   Recurrent episodes where Koreen  loses contact with her world and that have been studied thoroughly and appear nonepileptic in nature per Dr Terrace Arabia (neurology) The patient has had episodes starting in 2007 that initially began 1-4 times a day during which time she would have periods of unawareness followed by confusion. This continuous video EEG monitoring performed from October 8-12, 2007. Had   . Urethra dilated and patulous 07/03/2015   Patulous, Dilated Urethra. 5mL balloon on a 22-Fr catheter hoping this would prevent the bladder spasm from pushing the balloon down the urethra (Dr Logan Bores, Urology [facility], July 2016)    Hospitalizations: No., Head Injury: No., Nervous System Infections: No., Immunizations up to date: Yes.   Past Medical History Comments: See HPI  Birth History Extreme prematurity  Behavior History Anxiety and depression    Surgical History Past Surgical History:  Procedure Laterality Date  . ABDOMINAL HYSTERECTOMY    . APPENDECTOMY    . BACLOFEN PUMP REFILL     x 3 times  . CARPAL TUNNEL RELEASE  08/2008   Dr Teressa Senter  .  CESAREAN SECTION     x 2  . CHOLECYSTECTOMY  10/14/2015   Procedure: LAPAROSCOPIC CHOLECYSTECTOMY;  Surgeon: Violeta Gelinas, MD;  Location: [facility] OR;  Service: General;;  . COLPOSCOPY  06/2000  . INTRAUTERINE DEVICE INSERTION  04/30/11   Inserted by [facility]YN for  endometriosis  . LAPAROSCOPIC ASSISTED VAGINAL HYSTERECTOMY  10/27/2012  . MULTIPLE EXTRACTIONS WITH ALVEOLOPLASTY Bilateral 07/19/2013   Procedure: EXTRACTIONS #4, 1,61,09,60;  Surgeon: Francene Finders, DDS;  Location: [facility] OR;  Service: Oral Surgery;  Laterality: Bilateral;  . NEUROMA SURGERY Left    anterior and posterior intersosseous  . PAIN PUMP IMPLANTATION N/A 03/07/2014   Procedure: baclofen pump revision/replacement and Catheter connection replacement;  Surgeon: Cristi Loron, MD;  Location: [facility];  Service: Neurosurgery;  Laterality: N/A;  baclofen pump revision/replacement and Catheter connection replacement  . PROGRAMABLE BACLOFEN PUMP REVISION  03/17/14   Battery Replacement   . TUBAL LIGATION  2003  . URETHRA SURGERY    . WRIST SURGERY  06/2010   Dr Dierdre Searles, hand surgeon, [name]    Family History family history includes Alzheimer's disease in her paternal grandfather; Asthma in her father; Breast cancer in her mother and paternal grandmother; Colon cancer in her maternal grandmother; Heart attack in her maternal grandfather. Family History is otherwise negative for migraines, seizures, cognitive impairment, blindness, deafness, birth defects, chromosomal disorder, autism.  Social History Social History   Socioeconomic History  . Marital status: Divorced    Spouse name: Not on file  . Number of children: 2  . Years of education: college  . Highest education level: Not on file  Occupational History    Employer: UNEMPLOYED  Social Needs  . Financial resource strain: Not on file  . Food insecurity:    Worry: Not on file    Inability: Not on file  . Transportation needs:    Medical: Not on file     Non-medical: Not on file  Tobacco Use  . Smoking status: Never Smoker  . Smokeless tobacco: Never Used  Substance and Sexual Activity  . Alcohol use: Yes    Alcohol/week: 1.0 standard drinks    Types: 1 Glasses of wine per week    Comment: Drinks alcohol once a month.  . Drug use: No  . Sexual activity: Never  Lifestyle  . Physical activity:    Days per week: Not on file    Minutes per session: Not on file  . Stress: Not on file  Relationships  . Social connections:    Talks on phone: Not on file    Gets together: Not on file    Attends religious service: Not on file    Active member of club or organization: Not on file    Attends meetings of clubs or organizations: Not on file    Relationship status: Not on file  Other Topics Concern  . 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    She enjoys shopping.   Lives in a nursing facility,[facility] since 02/11/16      [name] [name], who has schizophrenia, MR, is father of daughter.  [name] [name] and pt are no longer together.  [name] [name] does not see [name] [name]. Daughter is 56 yo, [name] [name], lives in a group home in [place].    [name] [name]'s mother reportedly has breast cancer.    Needs assistance with ADL's and IADL's--has personal care services 20 hrs/wk;       No tobacco, EtOH, drugs.      **Case Manager: [name] [name] [phone]      Patient lives [facility]    Allergies Allergies  Allergen Reactions  . Morphine Dermatitis and Other (See Comments)    Skin turned  red Other reaction(s): Other (See Comments) Skin turned red    Physical Exam BP 130/80   Pulse 76   LMP 04/05/2011  General: thin woman, seated in power wheelchair, in no evident distress; graying black hair, brown eyes, left handed Head: normocephalic and atraumatic. Oropharynx benign. No dysmorphic features. She is unable to keep her head midline. Neck: supple  with no carotid bruits. Cardiovascular: regular rate and rhythm, no murmurs. Respiratory: Clear to auscultation bilaterally Abdomen: Bowel sounds present all four quadrants, abdomen soft, non-tender, non-distended. No hepatosplenomegaly or masses palpated. Has urinary catheter. Musculoskeletal: No skeletal deformities. She has neuromuscular scoliosis and contractures at both wrists. The right and and wrist is in a splint to keep in neutral position. She is able to extend her left wrist to neutral. She has flexion contractures at her hips, markedly increased adductor tone, contractures at the ankles with tight heel cords.  Skin: no rashes or neurocutaneous lesions  Neurologic Exam Mental Status: Awake and fully alert. Attention span, concentration and fund of knowledge appropriate for age. Speech with significant dysarthria and she is difficult to understand because of severe oral motor apraxia. She is able to follow some commands and participate in examination. Mood and affect flat today. Cranial Nerves: Fundoscopic exam - red reflex present.  Unable to fully visualize fundus.  Pupils equal briskly reactive to light.  Extraocular movements full without nystagmus. Visual fields full to confrontation. Hearing intact to whisper. Facial movements are asymmetric, has lower facial weakness with some drooling.  Able to only protrude her tongue to midline and to the right. Neck flexion and extension abnormal with poor head control.  Motor: Spastic dystonic quadriparesis. Right arm is flexed at the elbow. She is not able to fully extend her fingers. She manipulates the joystick for her wheelchair and her phone with her left hand. She can minimally abduct her legs at the hips.  Sensory: Intact to touch and temperature in all extremities. Coordination: Unable to adequately assess due to patient's inability to participate in examination. No dysmetria when reaching for objects. Gait and Station: Unable to  independently stand and bear weight. I did not get her out of her chair today. Reflexes: Diminished and symmetric. Toes neutral. No clonus  Impression 1.  Migraine without aura, improved 2.  Spastic/athetoid quadriparesis 3.  Tension headaches 4.  Dysarthria with oral motor apraxia 5.  History of extreme prematurity 6.  Possible diagnosis of lupus (pending)   Recommendations for plan of care The patient's previous [facility] records were reviewed. Dorella has neither had nor required imaging or lab studies since the last visit. She is a 42 year old woman with history of spastic/athetoid quadriparesis, migraine and tension headaches. She is taking and tolerating Topiramate for migraine prevention and has experienced improvement in migraine frequency and severity. I reminded [name] [name] of the need for her to be well hydrated and encouraged her to drink more water and less sweet tea. I commended her for decreasing her use of analgesics. I asked her to talk with her therapist and her psychiatrist about her feeling more depressed about her situation. I will see [name] [name] back in follow up in 3 months or sooner if needed. She agreed with the plans made today.   The medication list was reviewed and reconciled.  No changes were made in the prescribed medications today.  A complete medication list was provided to the patient.   Allergies as of 09/17/2018      Reactions   Morphine Dermatitis, Other (  See Comments)   Skin turned red Other reaction(s): Other (See Comments) Skin turned red      Medication List        Accurate as of 09/17/18 11:59 PM. Always use your most recent med list.          acetaminophen 650 MG CR tablet Commonly known as:  TYLENOL Take 1 tablet (650 mg total) by mouth every 8 (eight) hours as needed.   amantadine 100 MG capsule Commonly known as:  SYMMETREL Take 100 mg by mouth daily.   baclofen 20 MG tablet Commonly known as:  LIORESAL Take 20 mg by mouth 4 (four) times daily. For  bladder spasms   bisacodyl 10 MG suppository Commonly known as:  DULCOLAX Place 10 mg rectally 2 (two) times daily as needed for moderate constipation.   Buprenorphine HCl 450 MCG Film Place 450 mcg inside cheek every 12 (twelve) hours.   busPIRone 5 MG tablet Commonly known as:  BUSPAR Take 5 mg by mouth 3 (three) times daily.   butalbital-acetaminophen-caffeine 50-325-40 MG tablet Commonly known as:  FIORICET, ESGIC Take 2 tablets by mouth every 4 (four) hours as needed for headache (not controlled by Tylenol).   cephALEXin 500 MG capsule Commonly known as:  KEFLEX Take 1 capsule (500 mg total) by mouth 3 (three) times daily.   CERTA-VITE PO Take 1 tablet by mouth daily.   clonazePAM 0.5 MG tablet Commonly known as:  KLONOPIN Take 1.5 tablets (0.75 mg total) by mouth 3 (three) times daily.   feeding supplement (ENSURE ENLIVE) Liqd Take 237 mLs by mouth 2 (two) times daily between meals.   ferrous sulfate 325 (65 FE) MG tablet Take 325 mg by mouth daily.   FLUoxetine 40 MG capsule Commonly known as:  PROZAC Take 80 mg by mouth daily.   lactulose 10 GM/15ML solution Commonly known as:  CHRONULAC Take 20 g by mouth daily as needed for mild constipation or moderate constipation.   lamoTRIgine 100 MG tablet Commonly known as:  LAMICTAL Take 100 mg by mouth at bedtime.   linaclotide 145 MCG Caps capsule Commonly known as:  LINZESS Take 145 mcg by mouth daily before breakfast.   lubiprostone 24 MCG capsule Commonly known as:  AMITIZA Take 1 capsule (24 mcg total) by mouth 2 (two) times daily with a meal.   meropenem 1 g in sodium chloride 0.9 % 100 mL Inject 1 g into the vein every 8 (eight) hours.   oxybutynin 5 MG tablet Commonly known as:  DITROPAN Take 5 mg by mouth 4 (four) times daily.   polyethylene glycol powder powder Commonly known as:  GLYCOLAX/MIRALAX Take 1  1/2 dose  daily in 12 ounces of fluid every day.   QUEtiapine 50 MG tablet Commonly  known as:  SEROQUEL Take 150 mg by mouth at bedtime.   rivaroxaban 10 MG Tabs tablet Commonly known as:  XARELTO Take 10 mg by mouth daily.   ROXICODONE 5 MG immediate release tablet Generic drug:  oxyCODONE Take 5 mg by mouth every 4 (four) hours as needed for moderate pain or severe pain.   tiZANidine 4 MG capsule Commonly known as:  ZANAFLEX Take 4 mg by mouth 2 (two) times daily.   topiramate 100 MG tablet Commonly known as:  TOPAMAX Take 1 tablet at bedtime       Total time spent with the patient was 30 minutes, of which 50% or more was spent in counseling and coordination of care.   [name]  Nyana Haren NP-C

## 2018-09-18 ENCOUNTER — Ambulatory Visit: Payer: Medicare Other | Admitting: Physical Medicine & Rehabilitation

## 2018-09-18 ENCOUNTER — Encounter (INDEPENDENT_AMBULATORY_CARE_PROVIDER_SITE_OTHER): Payer: Self-pay | Admitting: Family

## 2018-09-21 ENCOUNTER — Encounter: Payer: Medicare Other | Attending: Physical Medicine & Rehabilitation

## 2018-09-21 ENCOUNTER — Ambulatory Visit: Payer: Medicare Other | Admitting: Physical Medicine & Rehabilitation

## 2018-09-21 DIAGNOSIS — R252 Cramp and spasm: Secondary | ICD-10-CM | POA: Insufficient documentation

## 2018-09-21 DIAGNOSIS — N318 Other neuromuscular dysfunction of bladder: Secondary | ICD-10-CM | POA: Diagnosis not present

## 2018-09-22 ENCOUNTER — Institutional Professional Consult (permissible substitution): Payer: Medicare Other | Admitting: Pulmonary Disease

## 2018-09-22 DIAGNOSIS — R5383 Other fatigue: Secondary | ICD-10-CM | POA: Diagnosis not present

## 2018-09-22 DIAGNOSIS — G8250 Quadriplegia, unspecified: Secondary | ICD-10-CM | POA: Diagnosis not present

## 2018-09-22 DIAGNOSIS — R768 Other specified abnormal immunological findings in serum: Secondary | ICD-10-CM | POA: Diagnosis not present

## 2018-09-22 DIAGNOSIS — G809 Cerebral palsy, unspecified: Secondary | ICD-10-CM | POA: Diagnosis not present

## 2018-09-22 DIAGNOSIS — M24549 Contracture, unspecified hand: Secondary | ICD-10-CM | POA: Diagnosis not present

## 2018-09-22 DIAGNOSIS — M359 Systemic involvement of connective tissue, unspecified: Secondary | ICD-10-CM | POA: Diagnosis not present

## 2018-09-22 DIAGNOSIS — M2550 Pain in unspecified joint: Secondary | ICD-10-CM | POA: Diagnosis not present

## 2018-09-29 ENCOUNTER — Encounter: Payer: Self-pay | Admitting: Physical Medicine & Rehabilitation

## 2018-09-29 ENCOUNTER — Ambulatory Visit (HOSPITAL_BASED_OUTPATIENT_CLINIC_OR_DEPARTMENT_OTHER): Payer: Medicare Other | Admitting: Physical Medicine & Rehabilitation

## 2018-09-29 VITALS — BP 102/69 | HR 91 | Ht 59.0 in | Wt 104.0 lb

## 2018-09-29 DIAGNOSIS — G800 Spastic quadriplegic cerebral palsy: Secondary | ICD-10-CM | POA: Diagnosis not present

## 2018-09-29 DIAGNOSIS — R252 Cramp and spasm: Secondary | ICD-10-CM | POA: Diagnosis not present

## 2018-09-29 NOTE — Progress Notes (Signed)
Dysport Injection for spasticity using needle EMG guidance  Dilution: 200 Units/ml Indication: Severe spasticity which interferes with ADL,mobility and/or  hygiene and is unresponsive to medication management and other conservative care Informed consent was obtained after describing risks and benefits of the procedure with the patient. This includes bleeding, bruising, infection, excessive weakness, or medication side effects. A REMS form is on file and signed. Needle:  needle electrode Number of units per muscle RIGHT Biceps200 FCR200 FCU100 FDP100 FDS 200 Pronator teres 100 Pronator quad 100 All injections were done after obtaining appropriate EMG activity and after negative drawback for blood. The patient tolerated the procedure well. Post procedure instructions were given. A followup appointment was made.

## 2018-09-29 NOTE — Patient Instructions (Signed)

## 2018-10-07 DIAGNOSIS — Z79899 Other long term (current) drug therapy: Secondary | ICD-10-CM | POA: Diagnosis not present

## 2018-10-07 DIAGNOSIS — M542 Cervicalgia: Secondary | ICD-10-CM | POA: Diagnosis not present

## 2018-10-09 ENCOUNTER — Telehealth (INDEPENDENT_AMBULATORY_CARE_PROVIDER_SITE_OTHER): Payer: Self-pay | Admitting: Pediatrics

## 2018-10-09 NOTE — Telephone Encounter (Signed)
Pt called back. [name] [name] requested appt with Dr. Sharene Skeans. I scheduled appt for 11/13. Pt would like to speak with [name] at her earliest convenience regarding her headaches.

## 2018-10-09 NOTE — Telephone Encounter (Signed)
I called and left a message for [name], inviting her to call back. TG

## 2018-10-09 NOTE — Telephone Encounter (Signed)
°  Who's calling (name and relationship to patient) : [name], father  Best contact number: [phone]  Provider they see: Sharene Skeans and [name]  Reason for call: Father called stating patient seems to be having issues with her pump. Please call.     PRESCRIPTION REFILL ONLY  Name of prescription:  Pharmacy:

## 2018-10-12 NOTE — Telephone Encounter (Signed)
I called and spoke with [name]. She said that she wanted to wait until her appointment on Weds 11/13 to discuss her concerns. I verified the time of the appointment with her. TG

## 2018-10-14 ENCOUNTER — Encounter (INDEPENDENT_AMBULATORY_CARE_PROVIDER_SITE_OTHER): Payer: Self-pay | Admitting: Pediatrics

## 2018-10-14 ENCOUNTER — Ambulatory Visit (INDEPENDENT_AMBULATORY_CARE_PROVIDER_SITE_OTHER): Payer: Medicare Other | Admitting: Pediatrics

## 2018-10-14 DIAGNOSIS — G803 Athetoid cerebral palsy: Secondary | ICD-10-CM | POA: Diagnosis not present

## 2018-10-14 NOTE — Progress Notes (Deleted)
Patient: [name] [name] MRN: [id number] Sex: female DOB: 1976/09/22  Provider: Ellison Carwin, MD Location of Care: [facility]  Note type: Routine return visit  History of Present Illness: Referral Source: Melanie March, MD History from: patient and [facility] chart Chief Complaint: Headaches  [name] [name] is a 42 y.o. female who ***  Review of Systems: A complete review of systems was remarkable for patient reports that she is experiencing muscle spasms. She also states that she is having pump issues, all other systems reviewed and negative.  Past Medical History Past Medical History:  Diagnosis Date  . Abdominal pain 01/02/2012   Overview:  Overview:  Per Previous pcp note- Dr. Ta--Pt has history of endometriosis and had DUB in 2011.  She She had u/s that showed normal endometrial stripe.  She had endometrial bx that was wnl.  She has a lot of abd cramping every month.  This cramping was sometimes not relieved with hydrocodone.  She was referred to [facility] for IUD placement to help endometriosis and abd cramping.  [name]  . Abdominal pain 01/02/2012   Overview:  Overview:  Per Previous pcp note- Dr. Ta--Pt has history of endometriosis and had DUB in 2011.  She She had u/s that showed normal endometrial stripe.  She had endometrial bx that was wnl.  She has a lot of abd cramping every month.  This cramping was sometimes not relieved with hydrocodone.  She was referred to [facility] for IUD placement to help endometriosis and abd cramping.  [name] was placed 04/30/11 by [facility].  Pt still complains of abd cramping, which I am treating with hydrocodone and ultram. Pt reports some initial relief of pain from IUD, but states it has now returned back to similar to previous cramping. Gave patient a shot of Toradol 60 mg IM today-since positive exacerbation of abdominal cramping during past 2 weeks. Physical exam reassuring. Patient may need to return to [facility] for further  workup since she is plugged in with the GYN providers there for further treatment options regarding endometriosis and abdominal cramping. The dysfunctional uterine bleeding now well contr  . Acute cystitis with hematuria   . Acute cystitis without hematuria   . Acute respiratory failure with hypoxia ([name])   . Anemia   . Cellulitis 12/22/2014  . Cerebral palsy ([name])    Spastic Cerebral palsy, mentally intact  . Contracture, joint, multiple sites    Electric wheelchair, uses left hand to operate chair.   . Depression   . Disease of female genital organs 10/24/2010   Overview:  Overview:  Pt has history of endometriosis and had DUB in 2011.  She had u/s that showed normal endometrial stripe.  She had endometrial bx that was wnl.  She has a lot of abd cramping every month.  This cramping was sometimes not relieved with hydrocodone.  She was referred to [facility] for IUD placement to help endometriosis and abd cramping.  [name] was placed 04/30/11 by [facility].  Pt still complains of abd cramping, which was treated with hydrocodone and ultram.   Last seen at [facility] by OB/GYN 12/24/11, given Depo Lupron injection.  Ordering CT scan to r/o additional cause of abdominal pain (pt with known endometriosis and will not tolerate vaginal U/S, pain not improved s/p [name] or with Lupron injection-- only caused hot flashes and amenorrhea, no pain relief).  Also made referral to pain clinic for better pain control.  Do not think hysterectomy would help with pain,  and concern for contamination of baclofen pump if did proceed with surgery.    8/5/13[name] OB/GYN: Pt was seen by   . [name]   . Dysarthria   . Dyspnea and respiratory abnormality 02/04/2011   Overview:  Overview:  Pt has history of recurrent PE and is on chronic coumadin.  Pt has been complaining of dyspnea that she describes as chest tightness.  She has been to the ER multiple times for this.  Usually she gets CTA and serial CE.   She has had at least 6 CTA in a period of since 2011.  She also has an O2 requirement. It is unclear why pt feels dyspneic, complains of chest tightness, or has O2 requirement.  I thought that her complaint may be from deconditioning and referred her to PT.  Unfortunately Medicaid only pays for 4 PT visits.  Pt should be doing these exercises at home, but she has not.  I have referred her to [facility], and she has been seen by [name].  He did not understand the etiology of her dyspnea and O2 requirement.  He will continue to follow her.   Last Assessment & Plan:  Pt presents with concerns for chest tightness and dyspnea.  She was seen in ED yesterday and discharged home.  While there she had a CT chest 04/20/11 showed 1.  Multifocal degradation as detailed above.  N  . Endometriosis   . GERD (gastroesophageal reflux disease)   . Gram positive sepsis ([name]) 10/24/2014  . Gross hematuria 03/12/2013   Overview:  Overview:  status post replacement of suprapubic tube 03/04/2013  Last Assessment & Plan:  Recent urine culture collected in ED on 5/4 did not result in significant growth. Moreover, patient has been asymptomatic with normal WBC, no fever and no true symptoms making symptomatic UTI less likely.  Will continue to monitor for any sign of infection.  Patient's xarelto had been held during her recent hospitalization due to hematuria. This has since been re-started. Monitor bleeding.    [name] Kitchen HCAP (healthcare-associated pneumonia) 10/25/2014  . HCAP (healthcare-associated pneumonia) 11/24/2017  . History of anticoagulant therapy 10/21/2012  . History of endometriosis 10/2012   OBGYN [facility]: Laparoscopic Endometrial Ablation, TVH,  . History of recurrent UTIs   . Hypertension   . HYPERTENSION, BENIGN 01/31/2010   Qualifier: Diagnosis of  By: [name], MD, [name] Incontinent of feces 03/11/2016  . Infantile cerebral palsy ([name]) 01/29/2007   Overview:  She has spastic CP.  She is mentally intact.  She is  wheelchair bound.  She is wheelchair bound for ambulation      . Intracervical pessary 05/07/2011   Overview:  Overview:  Placed by [facility]YN 04/30/11 to treat DUB and Endometriosis.  She is seen at GYN clinic at [facility] but was considered a poor surgical candidate and referred her to [facility].  For DUB she had pelvic ultrasound that showed thin stripe and she had endometrial Bx by Dr Jennette Kettle in GYN clinic, which was negative.     . Macroscopic hematuria 03/10/2013   status post replacement of suprapubic tube 03/04/2013   . Migraine   . OCD (obsessive compulsive disorder)   . Parapneumonic effusion 10/25/2014  . Presence of intrathecal baclofen pump 03/07/2014   R LQ. Insertion T10.    [name] Kitchen Psychiatric illness 10/31/2014  . Pulmonary embolism ([name])    Lifetime Coumadin  . Pulmonary embolism ([name]) 2011, 01/2011   Will  be on lifetime coumadin  . Recurrent pulmonary embolism ([name]) 05/03/2011   Pt has history of recurrent PE and is on chronic coumadin.  Pt has been complaining of dyspnea that she describes as chest tightness.  She has been to the ER multiple times for this.  Usually she gets CTA and serial CE.  She has had at least 6 CTA in a period of since 2011.  She also has an O2 requirement. It is unclear why pt feels dyspneic, complains of chest tightness, or has O2 requirement.  I thought that her complaint may be from deconditioning and referred her to PT.  Unfortunately Medicaid only pays for 4 PT visits.  Pt should be doing these exercises at home, but she has not.  I have referred her to [facility], and she has been seen by [name].  He did not understand the etiology of her dyspnea and O2 requirement.  He will continue to follow her.     . Seborrheic keratosis 01/18/2011  . Seizures ([name]) 02/12/2016  . Spasticity 01/05/2014  . Transient alteration of awareness, recurrent 09/08/2006   Recurrent episodes where Sharie  loses contact with her world and that have been studied thoroughly and appear nonepileptic  in nature per Dr Terrace Arabia (neurology) The patient has had episodes starting in 2007 that initially began 1-4 times a day during which time she would have periods of unawareness followed by confusion. This continuous video EEG monitoring performed from October 8-12, 2007. Had   . Urethra dilated and patulous 07/03/2015   Patulous, Dilated Urethra. 5mL balloon on a 22-Fr catheter hoping this would prevent the bladder spasm from pushing the balloon down the urethra (Dr Logan Bores, Urology [facility], July 2016)    Hospitalizations: No., Head Injury: No., Nervous System Infections: No., Immunizations up to date: Yes.    ***  Birth History *** lbs. *** oz. infant born at *** weeks gestational age to a *** year old g *** p *** *** *** *** female. Gestation was {Complicated/Uncomplicated Pregnancy:20185} Mother received {CN Delivery analgesics:210120005}  {method of delivery:313099} Nursery Course was {Complicated/Uncomplicated:20316} Growth and Development was {cn recall:210120004}  Behavior History {Symptoms; behavioral problems:18883}  Surgical History Past Surgical History:  Procedure Laterality Date  . ABDOMINAL HYSTERECTOMY    . APPENDECTOMY    . BACLOFEN PUMP REFILL     x 3 times  . CARPAL TUNNEL RELEASE  08/2008   Dr Teressa Senter  . CESAREAN SECTION     x 2  . CHOLECYSTECTOMY  10/14/2015   Procedure: LAPAROSCOPIC CHOLECYSTECTOMY;  Surgeon: Violeta Gelinas, MD;  Location: [facility] OR;  Service: General;;  . COLPOSCOPY  06/2000  . INTRAUTERINE DEVICE INSERTION  04/30/11   Inserted by [facility] GYN for endometriosis  . LAPAROSCOPIC ASSISTED VAGINAL HYSTERECTOMY  10/27/2012  . MULTIPLE EXTRACTIONS WITH ALVEOLOPLASTY Bilateral 07/19/2013   Procedure: EXTRACTIONS #4, 1,61,09,60;  Surgeon: Francene Finders, DDS;  Location: [facility] OR;  Service: Oral Surgery;  Laterality: Bilateral;  . NEUROMA SURGERY Left    anterior and posterior intersosseous  . PAIN PUMP IMPLANTATION N/A 03/07/2014   Procedure: baclofen pump  revision/replacement and Catheter connection replacement;  Surgeon: Cristi Loron, MD;  Location: [facility] NEUR[facility] ORS;  Service: Neurosurgery;  Laterality: N/A;  baclofen pump revision/replacement and Catheter connection replacement  . PROGRAMABLE BACLOFEN PUMP REVISION  03/17/14   Battery Replacement   . TUBAL LIGATION  2003  . URETHRA SURGERY    . WRIST SURGERY  06/2010   Dr Dierdre Searles, Hydrographic surveyor, [facility]  Family History family history includes Alzheimer's disease in her paternal grandfather; Asthma in her father; Breast cancer in her mother and paternal grandmother; Colon cancer in her maternal grandmother; Heart attack in her maternal grandfather. Family history is negative for migraines, seizures, intellectual disabilities, blindness, deafness, birth defects, chromosomal disorder, or autism.  Social History Social History   Socioeconomic History  . Marital status: Divorced    Spouse name: Not on file  . Number of children: 2  . Years of education: college  . Highest education level: Not on file  Occupational History    Employer: UNEMPLOYED  Social Needs  . Financial resource strain: Not on file  . Food insecurity:    Worry: Not on file    Inability: Not on file  . Transportation needs:    Medical: Not on file    Non-medical: Not on file  Tobacco Use  . Smoking status: Never Smoker  . Smokeless tobacco: Never Used  Substance and Sexual Activity  . Alcohol use: Yes    Alcohol/week: 1.0 standard drinks    Types: 1 Glasses of wine per week    Comment: Drinks alcohol once a month.  . Drug use: No  . Sexual activity: Never  Lifestyle  . Physical activity:    Days per week: Not on file    Minutes per session: Not on file  . Stress: Not on file  Relationships  . Social connections:    Talks on phone: Not on file    Gets together: Not on file    Attends religious service: Not on file    Active member of club or organization: Not on file    Attends meetings of clubs or  organizations: Not on file    Relationship status: Not on file  Other Topics Concern  . Not on file  Social History Narrative   [name] graduated from college with an Scientist, research (physical sciences)Associates Degree in [employer].    She enjoys shopping.   Lives in a nursing facility[facility] since 02/11/[facility]      Ailene ArdsFrank Haith, who has schizophrenia, MR, is father of daughter.  Homero FellersFrank and pt are no longer together.  Homero FellersFrank does not see Norberta KeensKiarra. Daughter is 44 yo, Levy SjogrenKiarra Haith, [place].    [name]'s mother reportedly has breast cancer.    Needs assistance with ADL's and IADL's--has personal care services 20 hrs/wk;       No tobacco, EtOH, drugs.      **Case Manager: Jack Quartosabelle Culler [phone]      [facility]     Allergies Allergies  Allergen Reactions  . Morphine Dermatitis and Other (See Comments)    Skin turned red Other reaction(s): Other (See Comments) Skin turned red    Physical Exam LMP 04/05/2011   ***   Assessment   Discussion   Plan  Allergies as of 10/14/2018      Reactions   Morphine Dermatitis, Other (See Comments)   Skin turned red Other reaction(s): Other (See Comments) Skin turned red      Medication List        Accurate as of 10/14/18  2:04 PM. Always use your most recent med list.          acetaminophen 650 MG CR tablet Commonly known as:  TYLENOL Take 1 tablet (650 mg total) by mouth every 8 (eight) hours as needed.   amantadine 100 MG capsule Commonly known as:  SYMMETREL Take 100 mg  by mouth daily.   baclofen 20 MG tablet Commonly known as:  LIORESAL Take 20 mg by mouth 4 (four) times daily. For bladder spasms   bisacodyl 10 MG suppository Commonly known as:  DULCOLAX Place 10 mg rectally 2 (two) times daily as needed for moderate constipation.   Buprenorphine HCl 450 MCG Film Place 450 mcg inside cheek every 12 (twelve) hours.   busPIRone 5 MG tablet Commonly known as:   BUSPAR Take 5 mg by mouth 3 (three) times daily.   butalbital-acetaminophen-caffeine 50-325-40 MG tablet Commonly known as:  FIORICET, ESGIC Take 2 tablets by mouth every 4 (four) hours as needed for headache (not controlled by Tylenol).   cephALEXin 500 MG capsule Commonly known as:  KEFLEX Take 1 capsule (500 mg total) by mouth 3 (three) times daily.   CERTA-VITE PO Take 1 tablet by mouth daily.   clonazePAM 0.5 MG tablet Commonly known as:  KLONOPIN Take 1.5 tablets (0.75 mg total) by mouth 3 (three) times daily.   feeding supplement (ENSURE ENLIVE) Liqd Take 237 mLs by mouth 2 (two) times daily between meals.   ferrous sulfate 325 (65 FE) MG tablet Take 325 mg by mouth daily.   FLUoxetine 40 MG capsule Commonly known as:  PROZAC Take 80 mg by mouth daily.   lactulose 10 GM/15ML solution Commonly known as:  CHRONULAC Take 20 g by mouth daily as needed for mild constipation or moderate constipation.   lamoTRIgine 100 MG tablet Commonly known as:  LAMICTAL Take 100 mg by mouth at bedtime.   linaclotide 145 MCG Caps capsule Commonly known as:  LINZESS Take 145 mcg by mouth daily before breakfast.   lubiprostone 24 MCG capsule Commonly known as:  AMITIZA Take 1 capsule (24 mcg total) by mouth 2 (two) times daily with a meal.   meropenem 1 g in sodium chloride 0.9 % 100 mL Inject 1 g into the vein every 8 (eight) hours.   oxybutynin 5 MG tablet Commonly known as:  DITROPAN Take 5 mg by mouth 4 (four) times daily.   polyethylene glycol powder powder Commonly known as:  GLYCOLAX/MIRALAX Take 1  1/2 dose  daily in 12 ounces of fluid every day.   QUEtiapine 50 MG tablet Commonly known as:  SEROQUEL Take 150 mg by mouth at bedtime.   rivaroxaban 10 MG Tabs tablet Commonly known as:  XARELTO Take 10 mg by mouth daily.   ROXICODONE 5 MG immediate release tablet Generic drug:  oxyCODONE Take 5 mg by mouth every 4 (four) hours as needed for moderate pain or  severe pain.   tiZANidine 4 MG capsule Commonly known as:  ZANAFLEX Take 4 mg by mouth 2 (two) times daily.   topiramate 100 MG tablet Commonly known as:  TOPAMAX Take 1 tablet at bedtime       The medication list was reviewed and reconciled. All changes or newly prescribed medications were explained.  A complete medication list was provided to the patient/caregiver.  Deetta Perla MD

## 2018-10-14 NOTE — Progress Notes (Signed)
Patient: [name] [name] MRN: [id number] Sex: female DOB: [birth year]  Provider: Ellison Carwin, MD Location of Care: [facility]  Note type: Routine return visit  History of Present Illness:  [name] [name] is a 42 y.o. 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 [name] [name] was last seen in neurology clinic by Elveria Rising, NP on 09/17/18. At this time Kimanh felt that her topiramate was working well for migraine prevention but [name] [name] reported feeling unusually fatigued with naps during the day, and also reported feeling depressed due to her chronic health problems and living at [facility]. [name] [name] has an indwelling Foley catheter and was treated for a UTI in early October.   Today, [name] [name] reports worsening rigidity compared to her baseline and also more blackouts than usual. [name] [name] also reports that her headaches are getting worse. [name] [name] believes all these symptoms are related to stress regarding her upcoming move next month.  [name] [name] will be moving into an independent apartment with assistance from her CNA. Her new apartment will be wheelchair accessible.   [name] denies other symptoms today. [name] [name] notes that her temperature has been 99.2 but denies burning with urination.   Procedure: Reprogramming intrathecal baclofen pump  The device was interrogated and showed a complex continuous infusion of baclofen that has a basal rate of21.26mcg per hour. The patient receives bolus doses of 27 mcg delivered at midnight, 6 AM, 12 noon, and 6 PM, for total of 525. per day.I reprogrammed the infusion rate today basal rate of 23.6 mcg/h.  This represented a 5% increase.  The infusion alarm date dropped to 35 days but was November 18, 2018, the day we have chosen for her to return.  [name] [name] tolerated the procedure well.  ERI is 25months.    Review of Systems: A complete review of systems was assessed  and was negative except as noted above.  Past Medical History Diagnosis Date  . Abdominal pain 01/02/2012   Overview:  Overview:  Per Previous pcp note- Dr. Ta--[name] [name] has history of endometriosis and had DUB in 2011.  [name] [name] [name] [name] had u/s that showed normal endometrial stripe.  [name] [name] had endometrial bx that was wnl.  [name] [name] has a lot of abd cramping every month.  This cramping was sometimes not relieved with hydrocodone.  [name] [name] was referred to [facility] for IUD placement to help endometriosis and abd cramping.  [name]  . Abdominal pain 01/02/2012   Overview:  Overview:  Per Previous pcp note- Dr. Ta--[name] [name] has history of endometriosis and had DUB in 2011.  [name] [name] [name] [name] had u/s that showed normal endometrial stripe.  [name] [name] had endometrial bx that was wnl.  [name] [name] has a lot of abd cramping every month.  This cramping was sometimes not relieved with hydrocodone.  [name] [name] was referred to [facility] for IUD placement to help endometriosis and abd cramping.  [name] was placed 04/30/11 by [facility].  [name] [name] still complains of abd cramping, which I am treating with hydrocodone and ultram. [name] [name] reports some initial relief of pain from IUD, but states it has now returned back to similar to previous cramping. Gave patient a shot of Toradol 60 mg IM today-since positive exacerbation of abdominal cramping during past 2 weeks. Physical exam reassuring. Patient may need to return to [facility] for further workup since [name] [name] is plugged in with the GYN providers there for further treatment options regarding endometriosis and abdominal cramping. The dysfunctional uterine bleeding now well contr  .  Acute cystitis with hematuria   . Acute cystitis without hematuria   . Acute respiratory failure with hypoxia ([name])   . Anemia   . Cellulitis 12/22/2014  . Cerebral palsy ([name])    Spastic Cerebral palsy, mentally intact  . Contracture, joint, multiple sites    Electric wheelchair, uses left hand to operate chair.   . Depression   . Disease of female genital organs  10/24/2010   Overview:  Overview:  [name] [name] has history of endometriosis and had DUB in 2011.  [name] [name] had u/s that showed normal endometrial stripe.  [name] [name] had endometrial bx that was wnl.  [name] [name] has a lot of abd cramping every month.  This cramping was sometimes not relieved with hydrocodone.  [name] [name] was [facility] to [name] for IUD placement to help endometriosis and abd cramping.  [name].  [name] [name] still complains of abd cramping, which was treated with hydrocodone and ultram.   [facility] at [name] 12/24/11, given Depo Lupron injection.  Ordering CT scan to r/o additional cause of abdominal pain ([name] [name] with known endometriosis and will not tolerate vaginal U/S, pain not improved s/p [name] or with Lupron injection-- only caused hot flashes and amenorrhea, no pain relief).  Also made referral to pain clinic for better pain control.  Do not think hysterectomy would help with pain, and concern for contamination of baclofen pump if did proceed with surgery.    8/5/13[name]: [facility]: [name] [name] was seen by   . [name]   . Dysarthria   . Dyspnea and respiratory abnormality 02/04/2011   Overview:  Overview:  [name] [name] has history of recurrent PE and is on chronic coumadin.  [name] [name] has been complaining of dyspnea that [name] [name] describes as chest tightness.  [name] [name] has been to the ER multiple times for this.  Usually [name] [name] gets CTA and serial CE.  [name] [name] has had at least 6 CTA in a period of since 2011.  [name] [name] also has an O2 requirement. It is unclear why [name] [name] feels dyspneic, complains of chest tightness, or has O2 requirement.  I thought that her complaint may be from deconditioning and referred her to [name] [name].  Unfortunately Medicaid only pays for 4 [name] [name] visits.  [name] [name] should be doing these exercises at home, but [name] [name] has not.  [facility], and [name] [name] has been seen by Dr Marchelle Gearingamaswamy.  He did not understand the etiology of her dyspnea and O2 requirement.  He will continue to follow her.   Last Assessment &  Plan:  [name] [name] presents with concerns for chest tightness and dyspnea.  [name] [name] was seen in ED yesterday and discharged home.  While there [name] [name] had a CT chest 04/20/11 showed 1.  Multifocal degradation as detailed above.  N  . Endometriosis   . GERD (gastroesophageal reflux disease)   . Gram positive sepsis ([name]) 10/24/2014  . Gross hematuria 03/12/2013   Overview:  Overview:  status post replacement of suprapubic tube 03/04/2013  Last Assessment & Plan:  Recent urine culture collected in ED on 5/4 did not result in significant growth. Moreover, patient has been asymptomatic with normal WBC, no fever and no true symptoms making symptomatic UTI less likely.  Will continue to monitor for any sign of infection.  Patient's xarelto had been held during her recent hospitalization due to hematuria. This has since been re-started. Monitor bleeding.    [name] Kitchen. HCAP (healthcare-associated pneumonia) 10/25/2014  . HCAP (healthcare-associated pneumonia) 11/24/2017  . History  of anticoagulant therapy 10/21/2012  . History of endometriosis 10/2012   OBGYN [facility]: Laparoscopic Endometrial Ablation, TVH,  . History of recurrent UTIs   . Hypertension   . HYPERTENSION, BENIGN 01/31/2010   Qualifier: Diagnosis of  By: [name], MD, [name] Incontinent of feces 03/11/2016  . Infantile cerebral palsy ([name]) 01/29/2007   Overview:  [name] [name] has spastic CP.  [name] [name] is mentally intact.  [name] [name] is wheelchair bound.  [name] [name] is wheelchair bound for ambulation      . Intracervical pessary 05/07/2011   Overview:  Overview:  Placed by [facility] 04/30/11 to treat DUB and Endometriosis.  [name] [name] is seen at GYN clinic at [facility] but was considered a poor surgical candidate and referred her to [facility].  For DUB [name] [name] had pelvic ultrasound that showed thin stripe and [name] [name] had endometrial Bx by Dr Jennette Kettle in GYN clinic, which was negative.     . Macroscopic hematuria 03/10/2013   status post replacement of suprapubic tube 03/04/2013   . Migraine   . OCD (obsessive  compulsive disorder)   . Parapneumonic effusion 10/25/2014  . Presence of intrathecal baclofen pump 03/07/2014   R LQ. Insertion T10.    [name] Kitchen Psychiatric illness 10/31/2014  . Pulmonary embolism ([name])    Lifetime Coumadin  . Pulmonary embolism ([name]) 2011, 01/2011   Will be on lifetime coumadin  . Recurrent pulmonary embolism ([name]) 05/03/2011   [name] [name] has history of recurrent PE and is on chronic coumadin.  [name] [name] has been complaining of dyspnea that [name] [name] describes as chest tightness.  [name] [name] has been to the ER multiple times for this.  Usually [name] [name] gets CTA and serial CE.  [name] [name] has had at least 6 CTA in a period of since 2011.  [name] [name] also has an O2 requirement. It is unclear why [name] [name] feels dyspneic, complains of chest tightness, or has O2 requirement.  I thought that her complaint may be from deconditioning and referred her to [name] [name].  Unfortunately Medicaid only pays for 4 [name] [name] visits.  [name] [name] should be doing these exercises at home, but [name] [name] has not.  I have referred her to [facility], and [name] [name] has been seen by [name].  He did not understand the etiology of her dyspnea and O2 requirement.  He will continue to follow her.     . Seborrheic keratosis 01/18/2011  . Seizures ([name]) 02/12/2016  . Spasticity 01/05/2014  . Transient alteration of awareness, recurrent 09/08/2006   Recurrent episodes where Matthew  loses contact with her world and that have been studied thoroughly and appear nonepileptic in nature per Dr Terrace Arabia (neurology) The patient has had episodes starting in 2007 that initially began 1-4 times a day during which time [name] [name] would have periods of unawareness followed by confusion. This continuous video EEG monitoring performed from October 8-12, 2007. Had   . Urethra dilated and patulous 07/03/2015   Patulous, Dilated Urethra. 5mL balloon on a 22-Fr catheter hoping this would prevent the bladder spasm from pushing the balloon down the urethra (Dr Logan Bores, Urology [facility], July 2016)    Hospitalizations: None since last visit Head  Injury: none Nervous System Infections: none Immunizations up to date: yes  Birth History Extreme prematurity (29 weeks) resulting in spastic/athetoid quadriparesis   Behavior History History of depression and anxiety  Surgical History Procedure Laterality Date  . ABDOMINAL HYSTERECTOMY    . APPENDECTOMY    . BACLOFEN PUMP REFILL     x 3 times  . CARPAL  TUNNEL RELEASE  08/2008   Dr Teressa Senter  . CESAREAN SECTION     x 2  . CHOLECYSTECTOMY  10/14/2015   Procedure: LAPAROSCOPIC CHOLECYSTECTOMY;  Surgeon: Violeta Gelinas, MD;  Location: [facility] OR;  Service: General;;  . COLPOSCOPY  06/2000  . INTRAUTERINE DEVICE INSERTION  04/30/11   Inserted by [facility] for endometriosis  . LAPAROSCOPIC ASSISTED VAGINAL HYSTERECTOMY  10/27/2012  . MULTIPLE EXTRACTIONS WITH ALVEOLOPLASTY Bilateral 07/19/2013   Procedure: EXTRACTIONS #4, 1,61,09,60;  Surgeon: Francene Finders, DDS;  Location: [facility] OR;  Service: Oral Surgery;  Laterality: Bilateral;  . NEUROMA SURGERY Left    anterior and posterior intersosseous  . PAIN PUMP IMPLANTATION N/A 03/07/2014   Procedure: baclofen pump revision/replacement and Catheter connection replacement;  Surgeon: Cristi Loron, MD;  Location: [facility];  Service: Neurosurgery;  Laterality: N/A;  baclofen pump revision/replacement and Catheter connection replacement  . PROGRAMABLE BACLOFEN PUMP REVISION  03/17/14   Battery Replacement   . TUBAL LIGATION  2003  . URETHRA SURGERY    . WRIST SURGERY  06/2010   Dr Dierdre Searles, hand surgeon, [name]    Family History family history includes Alzheimer's disease in her paternal grandfather; Asthma in her father; Breast cancer in her mother and paternal grandmother; Colon cancer in her maternal grandmother; Heart attack in her maternal grandfather. Family history is negative for migraines, seizures, intellectual disabilities, blindness, deafness, birth defects, chromosomal disorder, or autism.  Social History  Socioeconomic  History  . Marital status: Divorced  . Number of children: 2  . Years of education: college  . Highest education level:  Associates degree  Occupational History    Employer: UNEMPLOYED  Social Needs  . Financial resource strain: Not on file  . Food insecurity:    Worry: Not on file    Inability: Not on file  . Transportation needs:    Medical: Not on file    Non-medical: Not on file  Tobacco Use  . Smoking status: Never Smoker  . Smokeless tobacco: Never Used  Substance and Sexual Activity  . Alcohol use: Yes    Alcohol/week: 1.0 standard drinks    Types: 1 Glasses of wine per week    Comment: Drinks alcohol once a month.  . Drug use: No  . 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     [name] [name] enjoys shopping.    Lives in a nursing facility,[facility] since 02/11/16       Ailene Ards, who has schizophrenia, MR, is father of daughter.  Homero Fellers and [name] [name] are no longer together.  Homero Fellers does not see [name]. Daughter is 62 yo, Levy Sjogren, lives in a group home in [place].     [name]'s mother reportedly has breast cancer.     Needs assistance with ADL's and IADL's--has personal care services 20 hrs/wk;        No tobacco, EtOH, drugs.       **Case Manager: Jack Quarto [phone]      Patient lives [facility]   Allergies Allergen Reactions  . Morphine Dermatitis and Other (See Comments)    Skin turned red Other reaction(s): Other (See Comments) Skin turned red   Physical Exam LMP 04/05/2011   General: thin woman seated in power wheelchair in no acute distress  Head: normocephalic, no dysmorphic features. Some difficulty holding up head and keeping head in midline.  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 Flexion contractures  of hips, tight contractures of ankles.  Skin: no rashes or neurocutaneous lesions  Neurologic Exam  Mental Status: Alert and able to answer questions appropriately. Severe dysarthria of speech, making her quite difficult to understand. Able to follow commans.  Cranial Nerves: PERRL with intact EOM and no nystagmus. Visual fields full to confrontation. Hearing intact. Asymmetric facial movements and lower facial weakness with drooling. Able to stick out tongue.  Motor: Spastic dystonic quadriparesis with arms flexed at elbows. Unable to fully extend fingers. Dystonia is worse today than on recent prior exams.  Sensory: Intact to touch Coordination: unable to fully assess due to dystonia. No apparent dysmetria when reaching for objects.  Gait and Station: did not get out of power wheelchair today Reflexes: symmetric and diminished.  Assessment 1.  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  Discussion Anorah is a 42yo woman with spastic/athetoid quadriparesis related to extreme prematurity with a baclofen intrathecal pump in place, who presents today with worsening dystonia, headaches, and "blackouts" which [name] [name] attributes to stress related to her upcoming move. "Blackouts" have been worked up in the past and are very unlikely to be due to seizure activity. Will increase basal rate of intrathecal baclofen by 10% and continue current dose of topiramate for migraine prevention. [name] [name] confirms that her CNA will help her move safely from her nursing home into her new apartment. We would like to see her back in clinic after her move to monitor whether her stress-related symptoms have improved and to make sure that [name] [name] is safely living independently.   Plan Dystonia: -Will increase basal rate of baclofen infusion to 23.67mcg/hr today  Headaches: -Continue current dose of topiramate  Blackouts: -Encourage stress reduction and follow up with psychiatrist and therapist -Return to neurology clinic if worsening or  if associated with seizure-like symptoms  Living Situation: -Return to clinic after has moved into new apartment to follow up on stress-related symptoms and ensure safety   Medication List    Accurate as of 10/14/18 11:59 PM.      acetaminophen 650 MG CR tablet Commonly known as:  TYLENOL Take 1 tablet (650 mg total) by mouth every 8 (eight) hours as needed.   amantadine 100 MG capsule Commonly known as:  SYMMETREL Take 100 mg by mouth daily.   baclofen 20 MG tablet Commonly known as:  LIORESAL Take 20 mg by mouth 4 (four) times daily. For bladder spasms   bisacodyl 10 MG suppository Commonly known as:  DULCOLAX Place 10 mg rectally 2 (two) times daily as needed for moderate constipation.   Buprenorphine HCl 450 MCG Film Place 450 mcg inside cheek every 12 (twelve) hours.   busPIRone 5 MG tablet Commonly known as:  BUSPAR Take 5 mg by mouth 3 (three) times daily.   butalbital-acetaminophen-caffeine 50-325-40 MG tablet Commonly known as:  FIORICET, ESGIC Take 2 tablets by mouth every 4 (four) hours as needed for headache (not controlled by Tylenol).   cephALEXin 500 MG capsule Commonly known as:  KEFLEX Take 1 capsule (500 mg total) by mouth 3 (three) times daily.   CERTA-VITE PO Take 1 tablet by mouth daily.   clonazePAM 0.5 MG tablet Commonly known as:  KLONOPIN Take 1.5 tablets (0.75 mg total) by mouth 3 (three) times daily.   feeding supplement (ENSURE ENLIVE) Liqd Take 237 mLs by mouth 2 (two) times daily between meals.   ferrous sulfate 325 (65  FE) MG tablet Take 325 mg by mouth daily.   FLUoxetine 40 MG capsule Commonly known as:  PROZAC Take 80 mg by mouth daily.   lactulose 10 GM/15ML solution Commonly known as:  CHRONULAC Take 20 g by mouth daily as needed for mild constipation or moderate constipation.   lamoTRIgine 100 MG tablet Commonly known as:  LAMICTAL Take 100 mg by mouth at bedtime.   linaclotide 145 MCG Caps capsule Commonly known  as:  LINZESS Take 145 mcg by mouth daily before breakfast.   lubiprostone 24 MCG capsule Commonly known as:  AMITIZA Take 1 capsule (24 mcg total) by mouth 2 (two) times daily with a meal.   meropenem 1 g in sodium chloride 0.9 % 100 mL Inject 1 g into the vein every 8 (eight) hours.   oxybutynin 5 MG tablet Commonly known as:  DITROPAN Take 5 mg by mouth 4 (four) times daily.   polyethylene glycol powder powder Commonly known as:  GLYCOLAX/MIRALAX Take 1  1/2 dose  daily in 12 ounces of fluid every day.   QUEtiapine 50 MG tablet Commonly known as:  SEROQUEL Take 150 mg by mouth at bedtime.   rivaroxaban 10 MG Tabs tablet Commonly known as:  XARELTO Take 10 mg by mouth daily.   ROXICODONE 5 MG immediate release tablet Generic drug:  oxyCODONE Take 5 mg by mouth every 4 (four) hours as needed for moderate pain or severe pain.   tiZANidine 4 MG capsule Commonly known as:  ZANAFLEX Take 4 mg by mouth 2 (two) times daily.   topiramate 100 MG tablet Commonly known as:  TOPAMAX Take 1 tablet at bedtime    The medication list was reviewed and reconciled. All changes or newly prescribed medications were explained.  A complete medication list was provided to the patient/caregiver.  Greater than 50% of a 15-minute visit was spent in counseling and coordination of care concerning her stiffness and headaches.  I also reprogrammed the vagal nerve stimulator.  I supervised Dr. Juline Patch and agree with her notes except as amended.  I performed physical examination, participated in history taking, and guided decision making.  Deetta Perla MD

## 2018-10-15 ENCOUNTER — Institutional Professional Consult (permissible substitution): Payer: Medicare Other | Admitting: Pulmonary Disease

## 2018-10-21 ENCOUNTER — Institutional Professional Consult (permissible substitution): Payer: Medicare Other | Admitting: Internal Medicine

## 2018-10-23 DIAGNOSIS — M7918 Myalgia, other site: Secondary | ICD-10-CM | POA: Diagnosis not present

## 2018-10-23 DIAGNOSIS — R350 Frequency of micturition: Secondary | ICD-10-CM | POA: Diagnosis not present

## 2018-10-23 DIAGNOSIS — N319 Neuromuscular dysfunction of bladder, unspecified: Secondary | ICD-10-CM | POA: Diagnosis not present

## 2018-10-23 DIAGNOSIS — R3915 Urgency of urination: Secondary | ICD-10-CM | POA: Diagnosis not present

## 2018-10-27 ENCOUNTER — Ambulatory Visit (INDEPENDENT_AMBULATORY_CARE_PROVIDER_SITE_OTHER): Payer: Medicare Other | Admitting: Internal Medicine

## 2018-10-27 ENCOUNTER — Encounter: Payer: Self-pay | Admitting: Internal Medicine

## 2018-10-27 VITALS — BP 90/60 | HR 75

## 2018-10-27 DIAGNOSIS — R911 Solitary pulmonary nodule: Secondary | ICD-10-CM | POA: Diagnosis not present

## 2018-10-27 NOTE — Progress Notes (Signed)
[name] [name], female    DOB: September 19, 1976,    MRN: [id number]   Brief patient profile:  54 yowf never smoker with CP > w/c bound, very difficult to understand speech at baseline, referred to pulmonary clinic 10/27/2018 by Dr   Genelle Bal for spn x 5 mm in setting of prior PE's  10/27/2018  Pulmonary/ 1st office eval/Lavergne Hiltunen  Chief Complaint  Patient presents with  . Pulmonary Consult    Referred by Dr. Clarisa Kindred for eval of pulmonary nodule.    Dyspnea:  No but w/c bound Cough: none Sleep: ok SABA use: none   No obvious day to day or daytime variability or assoc excess/ purulent sputum or mucus plugs or hemoptysis or cp or chest tightness, subjective wheeze or overt sinus or hb symptoms.   Apparently sleeps ok  without nocturnal  or early am exacerbation  of respiratory  c/o's or need for noct saba. Also denies any obvious fluctuation of symptoms with weather or environmental changes or other aggravating or alleviating factors except as outlined above   No unusual exposure hx or h/o childhood pna/ asthma or knowledge of premature birth.  Current Allergies, Complete Past Medical History, Past Surgical History, Family History, and Social History were reviewed in [employer] record.  ROS  The following are not active complaints unless bolded Hoarseness, sore throat, dysphagia, dental problems, itching, sneezing,  nasal congestion or discharge of excess mucus or purulent secretions, ear ache,   fever, chills, sweats, unintended wt loss or wt gain, classically pleuritic or exertional cp,  orthopnea pnd or arm/hand swelling  or leg swelling, presyncope, palpitations, abdominal pain, anorexia, nausea, vomiting, diarrhea  or change in bowel habits or change in bladder habits, change in stools or change in urine, dysuria, hematuria,  rash, arthralgias, visual complaints, headache, numbness, weakness or ataxia or problems with walking or coordination,  change in mood or  memory.           Past Medical History:  Diagnosis Date  . Abdominal pain 01/02/2012   Overview:  Overview:  Per Previous pcp note- Dr. Ta--Pt has history of endometriosis and had DUB in 2011.  She She had u/s that showed normal endometrial stripe.  She had endometrial bx that was wnl.  She has a lot of abd cramping every month.  This cramping was sometimes not relieved with hydrocodone.  She was referred to [facility] for IUD placement to help endometriosis and abd cramping.  [name]  . Abdominal pain 01/02/2012   Overview:  Overview:  Per Previous pcp note- Dr. Ta--Pt has history of endometriosis and had DUB in 2011.  She She had u/s that showed normal endometrial stripe.  She had endometrial bx that was wnl.  She has a lot of abd cramping every month.  This cramping was sometimes not relieved with hydrocodone.  She was referred to [facility] for IUD placement to help endometriosis and abd cramping.  [name] was placed 04/30/11 by [facility].  Pt still complains of abd cramping, which I am treating with hydrocodone and ultram. Pt reports some initial relief of pain from IUD, but states it has now returned back to similar to previous cramping. Gave patient a shot of Toradol 60 mg IM today-since positive exacerbation of abdominal cramping during past 2 weeks. Physical exam reassuring. Patient may need to return to [facility] for further workup since she is plugged in with the GYN providers there for further treatment options regarding endometriosis and abdominal cramping.  The dysfunctional uterine bleeding now well contr  . Acute cystitis with hematuria   . Acute cystitis without hematuria   . Acute respiratory failure with hypoxia ([name])   . Anemia   . Cellulitis 12/22/2014  . Cerebral palsy ([name])    Spastic Cerebral palsy, mentally intact  . Contracture, joint, multiple sites    Electric wheelchair, uses left hand to operate chair.   . Depression   . Disease of female genital organs 10/24/2010   Overview:   Overview:  Pt has history of endometriosis and had DUB in 2011.  She had u/s that showed normal endometrial stripe.  She had endometrial bx that was wnl.  She has a lot of abd cramping every month.  This cramping was sometimes not relieved with hydrocodone.  She was referred to [facility] for IUD placement to help endometriosis and abd cramping.  [name] was placed 04/30/11 by [facility].  Pt still complains of abd cramping, which was treated with hydrocodone and ultram.   Last seen at [facility] by OB/GYN 12/24/11, given Depo Lupron injection.  Ordering CT scan to r/o additional cause of abdominal pain (pt with known endometriosis and will not tolerate vaginal U/S, pain not improved s/p [name] or with Lupron injection-- only caused hot flashes and amenorrhea, no pain relief).  Also made referral to pain clinic for better pain control.  Do not think hysterectomy would help with pain, and concern for contamination of baclofen pump if did proceed with surgery.    8/5/13[name] OB[facility]: Pt was seen by   . [name]   . Dysarthria   . Dyspnea and respiratory abnormality 02/04/2011   Overview:  Overview:  Pt has history of recurrent PE and is on chronic coumadin.  Pt has been complaining of dyspnea that she describes as chest tightness.  She has been to the ER multiple times for this.  Usually she gets CTA and serial CE.  She has had at least 6 CTA in a period of since 2011.  She also has an O2 requirement. It is unclear why pt feels dyspneic, complains of chest tightness, or has O2 requirement.  I thought that her complaint may be from deconditioning and referred her to PT.  Unfortunately Medicaid only pays for 4 PT visits.  Pt should be doing these exercises at home, but she has not.  I have referred her to [facility], and she has been seen by [name].  He did not understand the etiology of her dyspnea and O2 requirement.  He will continue to follow her.   Last Assessment & Plan:  Pt presents with  concerns for chest tightness and dyspnea.  She was seen in ED yesterday and discharged home.  While there she had a CT chest 04/20/11 showed 1.  Multifocal degradation as detailed above.  N  . Endometriosis   . GERD (gastroesophageal reflux disease)   . Gram positive sepsis ([name]) 10/24/2014  . Gross hematuria 03/12/2013   Overview:  Overview:  status post replacement of suprapubic tube 03/04/2013  Last Assessment & Plan:  Recent urine culture collected in ED on 5/4 did not result in significant growth. Moreover, patient has been asymptomatic with normal WBC, no fever and no true symptoms making symptomatic UTI less likely.  Will continue to monitor for any sign of infection.  Patient's xarelto had been held during her recent hospitalization due to hematuria. This has since been re-started. Monitor bleeding.    [name] Kitchen HCAP (healthcare-associated pneumonia) 10/25/2014  .  HCAP (healthcare-associated pneumonia) 11/24/2017  . History of anticoagulant therapy 10/21/2012  . History of endometriosis 10/2012   OBGYN [facility]: Laparoscopic Endometrial Ablation, TVH,  . History of recurrent UTIs   . Hypertension   . HYPERTENSION, BENIGN 01/31/2010   Qualifier: Diagnosis of  By: [name], MD, [name] Incontinent of feces 03/11/2016  . Infantile cerebral palsy ([name]) 01/29/2007   Overview:  She has spastic CP.  She is mentally intact.  She is wheelchair bound.  She is wheelchair bound for ambulation      . Intracervical pessary 05/07/2011   Overview:  Overview:  Placed by [facility] 04/30/11 to treat DUB and Endometriosis.  She is seen at GYN clinic at [facility] but was considered a poor surgical candidate and referred her to [facility].  For DUB she had pelvic ultrasound that showed thin stripe and she had endometrial Bx by Dr Jennette Kettle in GYN clinic, which was negative.     . Macroscopic hematuria 03/10/2013   status post replacement of suprapubic tube 03/04/2013   . Migraine   . OCD (obsessive compulsive disorder)   .  Parapneumonic effusion 10/25/2014  . Presence of intrathecal baclofen pump 03/07/2014   R LQ. Insertion T10.    [name] Kitchen Psychiatric illness 10/31/2014  . Pulmonary embolism ([name])    Lifetime Coumadin  . Pulmonary embolism ([name]) 2011, 01/2011   Will be on lifetime coumadin  . Recurrent pulmonary embolism ([name]) 05/03/2011   Pt has history of recurrent PE and is on chronic coumadin.  Pt has been complaining of dyspnea that she describes as chest tightness.  She has been to the ER multiple times for this.  Usually she gets CTA and serial CE.  She has had at least 6 CTA in a period of since 2011.  She also has an O2 requirement. It is unclear why pt feels dyspneic, complains of chest tightness, or has O2 requirement.  I thought that her complaint may be from deconditioning and referred her to PT.  Unfortunately Medicaid only pays for 4 PT visits.  Pt should be doing these exercises at home, but she has not.  I have referred her to [name], and she has been seen by [name].  He did not understand the etiology of her dyspnea and O2 requirement.  He will continue to follow her.     . Seborrheic keratosis 01/18/2011  . Seizures ([name]) 02/12/2016  . Spasticity 01/05/2014  . Transient alteration of awareness, recurrent 09/08/2006   Recurrent episodes where Adalyna  loses contact with her world and that have been studied thoroughly and appear nonepileptic in nature per Dr Terrace Arabia (neurology) The patient has had episodes starting in 2007 that initially began 1-4 times a day during which time she would have periods of unawareness followed by confusion. This continuous video EEG monitoring performed from October 8-12, 2007. Had   . Urethra dilated and patulous 07/03/2015   Patulous, Dilated Urethra. 5mL balloon on a 22-Fr catheter hoping this would prevent the bladder spasm from pushing the balloon down the urethra (Dr Logan Bores, Urology [facility], July 2016)     Outpatient Medications Prior to Visit  Medication Sig Dispense Refill  .  acetaminophen (TYLENOL 8 HOUR) 650 MG CR tablet Take 1 tablet (650 mg total) by mouth every 8 (eight) hours as needed. (Patient taking differently: Take 650 mg by mouth every 8 (eight) hours as needed for pain. ) 30 tablet 0  . amantadine (SYMMETREL) 100 MG capsule  Take 100 mg by mouth daily.     . baclofen (LIORESAL) 20 MG tablet Take 20 mg by mouth 4 (four) times daily. For bladder spasms    . bisacodyl (DULCOLAX) 10 MG suppository Place 10 mg rectally 2 (two) times daily as needed for moderate constipation.     . Buprenorphine HCl (BELBUCA) 450 MCG FILM Place 450 mcg inside cheek every 12 (twelve) hours. 1 each 0  . busPIRone (BUSPAR) 5 MG tablet Take 5 mg by mouth 3 (three) times daily.    . butalbital-acetaminophen-caffeine (FIORICET, ESGIC) 50-325-40 MG tablet Take 2 tablets by mouth every 4 (four) hours as needed for headache (not controlled by Tylenol). 14 tablet 0  . clonazePAM (KLONOPIN) 0.5 MG tablet Take 1.5 tablets (0.75 mg total) by mouth 3 (three) times daily. 15 tablet 0  . feeding supplement, ENSURE ENLIVE, (ENSURE ENLIVE) LIQD Take 237 mLs by mouth 2 (two) times daily between meals. 237 mL 0  . ferrous sulfate 325 (65 FE) MG tablet Take 325 mg by mouth daily.     [name] Kitchen FLUoxetine (PROZAC) 40 MG capsule Take 80 mg by mouth daily.    [name] Kitchen guaiFENesin (MUCINEX) 600 MG 12 hr tablet Take 600 mg by mouth 2 (two) times daily.    . hydroxychloroquine (PLAQUENIL) 200 MG tablet Take 200 mg by mouth daily.    [name] Kitchen lactulose (CHRONULAC) 10 GM/15ML solution Take 20 g by mouth daily as needed for mild constipation or moderate constipation.    [name] Kitchen lamoTRIgine (LAMICTAL) 100 MG tablet Take 100 mg by mouth at bedtime.    [name] Kitchen linaclotide (LINZESS) 145 MCG CAPS capsule Take 145 mcg by mouth daily before breakfast.    . lubiprostone (AMITIZA) 24 MCG capsule Take 1 capsule (24 mcg total) by mouth 2 (two) times daily with a meal. 60 capsule 11  . meropenem 1 g in sodium chloride 0.9 % 100 mL Inject 1 g into the vein  every 8 (eight) hours. 9 g 0  . Multiple Vitamins-Minerals (CERTA-VITE PO) Take 1 tablet by mouth daily.    [name] Kitchen oxybutynin (DITROPAN) 5 MG tablet Take 5 mg by mouth 4 (four) times daily.     [name] Kitchen oxyCODONE (ROXICODONE) 5 MG immediate release tablet Take 5 mg by mouth every 4 (four) hours as needed for moderate pain or severe pain.    [name] Kitchen oxyCODONE-acetaminophen (PERCOCET/ROXICET) 5-325 MG tablet Take 1 tablet by mouth every 8 (eight) hours as needed for severe pain.    . phenazopyridine (PYRIDIUM) 200 MG tablet Take 200 mg by mouth 3 (three) times daily as needed for pain.    . polyethylene glycol powder (GLYCOLAX/MIRALAX) powder Take 1  1/2 dose  daily in 12 ounces of fluid every day. (Patient taking differently: Take 22.5 g by mouth daily. ) 255 g 3  . QUEtiapine (SEROQUEL) 50 MG tablet Take 150 mg by mouth at bedtime.    . rivaroxaban (XARELTO) 10 MG TABS tablet Take 10 mg by mouth daily.    [name] Kitchen tiZANidine (ZANAFLEX) 4 MG capsule Take 4 mg by mouth 2 (two) times daily.     [name] Kitchen topiramate (TOPAMAX) 100 MG tablet Take 1 tablet at bedtime 30 tablet 3  . cephALEXin (KEFLEX) 500 MG capsule Take 1 capsule (500 mg total) by mouth 3 (three) times daily. 28 capsule 0   No facility-administered medications prior to visit.      Objective:     BP 90/60 (BP Location: Left Arm, Cuff Size: Normal)   Pulse 75  LMP 04/05/2011   SpO2 96%   SpO2: 96 % RA  Severely debilitated appearing wf somwhat contorted posture in w/c  orophx clear No neck nodes  Lungs clear bilaterally  RRR no s 3  abd soft / suprapubic catheter in place  Ext with muscle atrophy     I personally reviewed images and agree with radiology impression as follows:   Chest CT 08/19/18 No pleural fluid. Right hemidiaphragm elevation. A 5 mm right lower lobe pulmonary nodule on image 82/6 is not readily apparent on priors, but may have been obscured by atelectasis in this region. No lobar consolidation Imp: 5 mm right lower lobe  pulmonary nodule. No follow-up needed if patient is low-risk.     Assessment   Solitary pulmonary nodule on lung CT SPN's < 6 mm in pts who have no risk factors for ca are considered benign by [employer] guidelines - given that she has h/o PE and the prior images showed atx in same area could just be residual for infarct but in the setting of so many severe comorbidities in a pt with CP who never smoked, the risk of aggressive f/u and intervention far outweigh any potential benefit to this unfortunate lady and pulmonary f/u can be prn.   Discussed in detail all the  indications, usual  risks and alternatives  relative to the benefits with patient who agrees to proceed with conservative rx as outlined and f/u here prn new resp symptoms > Marchelle GearingRamaswamy is her pulmonologist of record.   Total time devoted to counseling  > 50 % of initial 45  min office visit:  review case with pt/ discussion of options/alternatives/ personally creating written customized instructions  in presence of pt  then going over those specific  Instructions directly with the pt including how to use all of the meds but in particular covering each new medication in detail and the difference between the maintenance= "automatic" meds and the prns using an action plan format for the latter (If this problem/symptom => do that organization reading Left to right).  Please see AVS from this visit for a full list of these instructions which I personally wrote for this pt and  are unique to this visit.      Sandrea HughsMichael Macsen Nuttall, MD 10/27/2018

## 2018-10-27 NOTE — Patient Instructions (Signed)
The lung nodule is 5 mm and per guidelines in this situation there is no indication at all for further work up     No need for follow up in pulmonary clinic unless new respiratory symptoms develop

## 2018-10-28 ENCOUNTER — Encounter: Payer: Self-pay | Admitting: Internal Medicine

## 2018-10-28 DIAGNOSIS — R911 Solitary pulmonary nodule: Secondary | ICD-10-CM | POA: Insufficient documentation

## 2018-10-28 NOTE — Assessment & Plan Note (Signed)
SPN's < 6 mm in pts who have no risk factors for ca are considered benign by [employer] guidelines - given that she has h/o PE and the prior images showed atx in same area could just be residual for infarct but in the setting of so many severe comorbidities in a pt with CP who never smoked, the risk of aggressive f/u and intervention far outweigh any potential benefit to this unfortunate lady and pulmonary f/u can be prn.   Discussed in detail all the  indications, usual  risks and alternatives  relative to the benefits with patient who agrees to proceed with conservative rx as outlined and f/u here prn new resp symptoms > Marchelle GearingRamaswamy is her pulmonologist of record.   Total time devoted to counseling  > 50 % of initial 45  min office visit:  review case with pt/ discussion of options/alternatives/ personally creating written customized instructions  in presence of pt  then going over those specific  Instructions directly with the pt including how to use all of the meds but in particular covering each new medication in detail and the difference between the maintenance= "automatic" meds and the prns using an action plan format for the latter (If this problem/symptom => do that organization reading Left to right).  Please see AVS from this visit for a full list of these instructions which I personally wrote for this pt and  are unique to this visit.

## 2018-11-02 DIAGNOSIS — R532 Functional quadriplegia: Secondary | ICD-10-CM | POA: Diagnosis not present

## 2018-11-02 DIAGNOSIS — J9859 Other diseases of mediastinum, not elsewhere classified: Secondary | ICD-10-CM | POA: Diagnosis not present

## 2018-11-02 DIAGNOSIS — G8929 Other chronic pain: Secondary | ICD-10-CM | POA: Diagnosis not present

## 2018-11-02 DIAGNOSIS — F39 Unspecified mood [affective] disorder: Secondary | ICD-10-CM | POA: Diagnosis not present

## 2018-11-03 ENCOUNTER — Encounter (HOSPITAL_COMMUNITY): Payer: Self-pay | Admitting: *Deleted

## 2018-11-03 ENCOUNTER — Other Ambulatory Visit: Payer: Self-pay

## 2018-11-03 ENCOUNTER — Emergency Department (HOSPITAL_COMMUNITY)
Admission: EM | Admit: 2018-11-03 | Discharge: 2018-11-04 | Disposition: A | Discharge status: Home / Self Care | Payer: Medicare Other | Attending: Emergency Medicine | Admitting: Emergency Medicine

## 2018-11-03 DIAGNOSIS — R51 Headache: Secondary | ICD-10-CM | POA: Diagnosis not present

## 2018-11-03 DIAGNOSIS — G801 Spastic diplegic cerebral palsy: Secondary | ICD-10-CM | POA: Diagnosis not present

## 2018-11-03 DIAGNOSIS — Z79899 Other long term (current) drug therapy: Secondary | ICD-10-CM | POA: Insufficient documentation

## 2018-11-03 DIAGNOSIS — N390 Urinary tract infection, site not specified: Secondary | ICD-10-CM | POA: Insufficient documentation

## 2018-11-03 DIAGNOSIS — Z7901 Long term (current) use of anticoagulants: Secondary | ICD-10-CM | POA: Insufficient documentation

## 2018-11-03 DIAGNOSIS — R319 Hematuria, unspecified: Secondary | ICD-10-CM | POA: Diagnosis not present

## 2018-11-03 DIAGNOSIS — I10 Essential (primary) hypertension: Secondary | ICD-10-CM | POA: Diagnosis not present

## 2018-11-03 DIAGNOSIS — Z86711 Personal history of pulmonary embolism: Secondary | ICD-10-CM | POA: Insufficient documentation

## 2018-11-03 DIAGNOSIS — R519 Headache, unspecified: Secondary | ICD-10-CM

## 2018-11-03 LAB — URINALYSIS, ROUTINE W REFLEX MICROSCOPIC
Bilirubin Urine: NEGATIVE
Glucose, UA: NEGATIVE mg/dL
Ketones, ur: NEGATIVE mg/dL
Nitrite: NEGATIVE
PROTEIN: NEGATIVE mg/dL
Specific Gravity, Urine: 1.008 (ref 1.005–1.030)
pH: 8 (ref 5.0–8.0)

## 2018-11-03 MED ORDER — TIZANIDINE HCL 4 MG PO TABS
4.0000 mg | ORAL_TABLET | Freq: Once | ORAL | Status: AC
  Administered 2018-11-04: 4 mg via ORAL
  Filled 2018-11-03: qty 1

## 2018-11-03 MED ORDER — DEXAMETHASONE SODIUM PHOSPHATE 10 MG/ML IJ SOLN
10.0000 mg | Freq: Once | INTRAMUSCULAR | Status: AC
  Administered 2018-11-03: 10 mg via INTRAVENOUS
  Filled 2018-11-03: qty 1

## 2018-11-03 MED ORDER — DIPHENHYDRAMINE HCL 50 MG/ML IJ SOLN
25.0000 mg | Freq: Once | INTRAMUSCULAR | Status: AC
  Administered 2018-11-03: 25 mg via INTRAVENOUS
  Filled 2018-11-03: qty 1

## 2018-11-03 MED ORDER — KETOROLAC TROMETHAMINE 15 MG/ML IJ SOLN
10.0000 mg | Freq: Once | INTRAMUSCULAR | Status: AC
  Administered 2018-11-03: 10 mg via INTRAVENOUS
  Filled 2018-11-03: qty 1

## 2018-11-03 MED ORDER — SODIUM CHLORIDE 0.9 % IV BOLUS
500.0000 mL | Freq: Once | INTRAVENOUS | Status: AC
  Administered 2018-11-03: 500 mL via INTRAVENOUS

## 2018-11-03 MED ORDER — CEPHALEXIN 500 MG PO CAPS
500.0000 mg | ORAL_CAPSULE | Freq: Once | ORAL | Status: AC
  Administered 2018-11-04: 500 mg via ORAL
  Filled 2018-11-03: qty 1

## 2018-11-03 MED ORDER — CEPHALEXIN 500 MG PO CAPS: 500.0000 mg | ORAL_CAPSULE | Freq: Two times a day (BID) | ORAL | 0 refills | Status: AC

## 2018-11-03 MED ORDER — METOCLOPRAMIDE HCL 5 MG/ML IJ SOLN
10.0000 mg | Freq: Once | INTRAMUSCULAR | Status: AC
  Administered 2018-11-03: 10 mg via INTRAVENOUS
  Filled 2018-11-03: qty 2

## 2018-11-03 MED ORDER — KETOROLAC TROMETHAMINE 15 MG/ML IJ SOLN: 15.0000 mg | Freq: Once | INTRAMUSCULAR | Status: DC

## 2018-11-03 MED ORDER — BACLOFEN 10 MG PO TABS
20.0000 mg | ORAL_TABLET | Freq: Once | ORAL | Status: AC
  Administered 2018-11-04: 20 mg via ORAL
  Filled 2018-11-03: qty 2

## 2018-11-03 NOTE — ED Provider Notes (Signed)
[facility]-EMERGENCY DEPT Provider Note   CSN: [id number] Arrival date & time: 11/03/18  1909     History   Chief Complaint No chief complaint on file.   HPI [name] [name] is a 42 y.o. female.  HPI   Patient is a 42 year old female with a history of cerebral palsy, chronic headaches (followed by neurology), who presents the emergency department today for evaluation of a headache that began this morning.  Headache began gradually.  Headache located to the frontal aspect of the head.  Pain is constant and severe in nature.  It is not associated with photophobia, vomiting, numbness/weakness.  States that she has had some increase in muscle spasms and thinks this may be contributing to her symptoms.  She is also concerned because she states her blood pressure has been fluctuating today.  Denies any other symptoms.  No recent falls or trauma.  She takes daily headache medication but it has not resolved her symptoms today.  Reviewed prior records.  Headache consistent with prior complaints of headaches.  Her facility noted that her urine was dark.   Past Medical History:  Diagnosis Date  . Abdominal pain 01/02/2012   Overview:  Overview:  Per Previous pcp note- Dr. Ta--Pt has history of endometriosis and had DUB in 2011.  She She had u/s that showed normal endometrial stripe.  She had endometrial bx that was wnl.  She has a lot of abd cramping every month.  This cramping was sometimes not relieved with hydrocodone.  She was referred to [facility] for IUD placement to help endometriosis and abd cramping.  [name]  . Abdominal pain 01/02/2012   Overview:  Overview:  Per Previous pcp note- Dr. Ta--Pt has history of endometriosis and had DUB in 2011.  She She had u/s that showed normal endometrial stripe.  She had endometrial bx that was wnl.  She has a lot of abd cramping every month.  This cramping was sometimes not relieved with hydrocodone.  She was referred to [facility]  for IUD placement to help endometriosis and abd cramping.  [name] was placed 04/30/11 by [facility].  Pt still complains of abd cramping, which I am treating with hydrocodone and ultram. Pt reports some initial relief of pain from IUD, but states it has now returned back to similar to previous cramping. Gave patient a shot of Toradol 60 mg IM today-since positive exacerbation of abdominal cramping during past 2 weeks. Physical exam reassuring. Patient may need to return to [facility] for further workup since she is plugged in with the GYN providers there for further treatment options regarding endometriosis and abdominal cramping. The dysfunctional uterine bleeding now well contr  . Acute cystitis with hematuria   . Acute cystitis without hematuria   . Acute respiratory failure with hypoxia ([name])   . Anemia   . Cellulitis 12/22/2014  . Cerebral palsy ([name])    Spastic Cerebral palsy, mentally intact  . Contracture, joint, multiple sites    Electric wheelchair, uses left hand to operate chair.   . Depression   . Disease of female genital organs 10/24/2010   Overview:  Overview:  Pt has history of endometriosis and had DUB in 2011.  She had u/s that showed normal endometrial stripe.  She had endometrial bx that was wnl.  She has a lot of abd cramping every month.  This cramping was sometimes not relieved with hydrocodone.  She was referred to [facility] for IUD placement to help endometriosis and  abd cramping.  [name] was placed 04/30/11 by [facility].  Pt still complains of abd cramping, which was treated with hydrocodone and ultram.   Last seen at [facility] by OB/GYN 12/24/11, given Depo Lupron injection.  Ordering CT scan to r/o additional cause of abdominal pain (pt with known endometriosis and will not tolerate vaginal U/S, pain not improved s/p [name] or with Lupron injection-- only caused hot flashes and amenorrhea, no pain relief).  Also made referral to pain clinic for better pain control.  Do not think  hysterectomy would help with pain, and concern for contamination of baclofen pump if did proceed with surgery.    8/5/13[name]: Pt was seen by   . [name]   . Dysarthria   . Dyspnea and respiratory abnormality 02/04/2011   Overview:  Overview:  Pt has history of recurrent PE and is on chronic coumadin.  Pt has been complaining of dyspnea that she describes as chest tightness.  She has been to the ER multiple times for this.  Usually she gets CTA and serial CE.  She has had at least 6 CTA in a period of since 2011.  She also has an O2 requirement. It is unclear why pt feels dyspneic, complains of chest tightness, or has O2 requirement.  I thought that her complaint may be from deconditioning and referred her to PT.  Unfortunately Medicaid only pays for 4 PT visits.  Pt should be doing these exercises at home, but she has not.  I have referred her to [facility], and she has been seen by [name].  He did not understand the etiology of her dyspnea and O2 requirement.  He will continue to follow her.   Last Assessment & Plan:  Pt presents with concerns for chest tightness and dyspnea.  She was seen in ED yesterday and discharged home.  While there she had a CT chest 04/20/11 showed 1.  Multifocal degradation as detailed above.  N  . Endometriosis   . GERD (gastroesophageal reflux disease)   . Gram positive sepsis ([name]) 10/24/2014  . Gross hematuria 03/12/2013   Overview:  Overview:  status post replacement of suprapubic tube 03/04/2013  Last Assessment & Plan:  Recent urine culture collected in ED on 5/4 did not result in significant growth. Moreover, patient has been asymptomatic with normal WBC, no fever and no true symptoms making symptomatic UTI less likely.  Will continue to monitor for any sign of infection.  Patient's xarelto had been held during her recent hospitalization due to hematuria. This has since been re-started. Monitor bleeding.    [name] Kitchen HCAP (healthcare-associated  pneumonia) 10/25/2014  . HCAP (healthcare-associated pneumonia) 11/24/2017  . History of anticoagulant therapy 10/21/2012  . History of endometriosis 10/2012   OBGYN [facility]: Laparoscopic Endometrial Ablation, TVH,  . History of recurrent UTIs   . Hypertension   . HYPERTENSION, BENIGN 01/31/2010   Qualifier: Diagnosis of  By: [name], MD, [name] Incontinent of feces 03/11/2016  . Infantile cerebral palsy ([name]) 01/29/2007   Overview:  She has spastic CP.  She is mentally intact.  She is wheelchair bound.  She is wheelchair bound for ambulation      . Intracervical pessary 05/07/2011   Overview:  Overview:  Placed by [facility] 04/30/11 to treat DUB and Endometriosis.  She is seen at GYN clinic at [facility] but was considered a poor surgical candidate and referred her to [facility].  For DUB she had  55Cordelia Poche /2019  . Incontinent of feces 03/11/2016  . Chronic anticoagulation 03/11/2016  . Involuntary movements 02/22/2016  . Seizures ([name]) 02/12/2016  . Dyspnea 02/12/2016  . Cerebral palsy, quadriplegic ([name]) 02/06/2016  . Swelling of extremity 01/13/2016  . Fever   . Pressure ulcer 10/09/2015  . Status post insertion of inferior vena caval filter 09/28/2015  . Absence of bladder continence 08/25/2015  . Person living in residential institution 06/14/2015  . Pulmonary hypertension ([name])   . Anemia of chronic disease   . Spasticity 03/15/2015  . Constipation, chronic 10/10/2014  . Athetoid cerebral palsy ([name])  04/13/2014  . Dystonia 04/12/2014  . UTI (lower urinary tract infection) 03/16/2014  . Presence of intrathecal baclofen pump 03/07/2014  . Dental caries 07/18/2013  . Multiple thyroid nodules 05/20/2013  . Osteoarthrosis 04/07/2013  . [name]   . Dysarthria   . GERD (gastroesDCordelia Pochemetrios Loll ADTEXTTAG9m> lux diseaseDCDC20mor elia PochemDCordelia Pochemetrios Loll DCordelia Pochemetrios Lol <BADTEXTTA54mG> Lol79mli<DDCordelia Pochemetrios Loll<BADT70mEX TTAG>etrios LDCordelia Pochemetrios Lol<BADTE96mXT TAG>TAG><BADTDCordelia Pochemetrios Lol TAG>TAG>De56mm trioDemetrioDCordelia Pochemetrios Loll TTAG>  bladder 02/05/2013  . Anxiety state 10/21/2012  . Chronic pain syndrome 06/17/2012  . Recurrent pulmonary embolism ([name]) 05/03/2011  . Pulmonary embolism with infarction ([name]) 05/03/2011  . Chronic suprapubic catheter ([name]) 03/15/2011  . Disease of female genital organs 10/24/2010  . HYPERTENSION, BENIGN 01/31/2010  . CP (cerebral palsy), spastic ([name]) 12/27/2008  . Contracted, joint, multiple sites 12/27/2008  . Major depressive disorder, recurrent episode ([name]) 01/29/2007  . Obsessive Compulsive Disorder 01/29/2007  . Transient alteration of awareness, recurrent 09/08/2006    Past Surgical History:  Procedure Laterality Date  . ABDOMINAL HYSTERECTOMY    . APPENDECTOMY    . BACLOFEN PUMP REFILL     x 3 times  . CARPAL TUNNEL RELEASE  08/2008   Dr Teressa Senter  . CESAREAN SECTION     x 2  . CHOLECYSTECTOMY  10/14/2015   Procedure: LAPAROSCOPIC CHOLECYSTECTOMY;  Surgeon: Violeta Gelinas, MD;  Location: [facility] OR;  Service: General;;  . COLPOSCOPY  06/2000  . INTRAUTERINE DEVICE INSERTION  04/30/11   Inserted by [facility] for endometriosis  . LAPAROSCOPIC ASSISTED VAGINAL HYSTERECTOMY  10/27/2012  . MULTIPLE EXTRACTIONS WITH ALVEOLOPLASTY Bilateral 07/19/2013   Procedure: EXTRACTIONS #4, 1,61,09,60;  Surgeon: Francene Finders, DDS;  Location: [facility] OR;  Service: Oral Surgery;  Laterality: Bilateral;  . NEUROMA SURGERY Left    anterior and posterior intersosseous  . PAIN PUMP IMPLANTATION N/A 03/07/2014   Procedure: baclofen pump revision/replacement and Catheter connection replacement;  Surgeon: Cristi Loron, MD;  Location: [facility];   Service: Neurosurgery;  Laterality: N/A;  baclofen pump revision/replacement and Catheter connection replacement  . PROGRAMABLE BACLOFEN PUMP REVISION  03/17/14   Battery Replacement   . TUBAL LIGATION  2003  . URETHRA SURGERY    . WRIST SURGERY  06/2010   Dr Dierdre Searles, hand surgeon, [name]     OB History   None      Home Medications    Prior to Admission medications   Medication Sig Start Date End Date Taking? Authorizing Provider  acetaminophen (TYLENOL 8 HOUR) 650 MG CR tablet Take 1 tablet (650 mg total) by mouth every 8 (eight) hours as needed. Patient taking differently: Take 650 mg by mouth every 8 (eight) hours as needed for pain.  07/08/18   Derwood Kaplan, MD  amantadine (SYMMETREL) 100 MG capsule Take 100 mg by mouth daily.     [provider]  baclofen (LIORESAL) 20 MG tablet Take 20 mg by mouth 4 (four) times daily. For bladder spasms    [provider]  bisacodyl (DULCOLAX) 10 MG suppository Place 10 mg rectally 2 (two) times daily as needed for moderate constipation.     [provider]  Buprenorphine HCl (BELBUCA) 450 MCG FILM Place 450 mcg inside cheek every 12 (twelve) hours. 08/22/18   Amin, Loura Halt, MD  busPIRone (BUSPAR) 5 MG tablet Take 5 mg by mouth 3 (three) times daily.    [provider]  butalbital-acetaminophen-caffeine (FIORICET, ESGIC) 50-325-40 MG tablet Take 2 tablets by mouth every 4 (four) hours as needed for headache (not controlled by Tylenol). 05/29/18   Darlin Drop, DO  cephALEXin (KEFLEX) 500 MG capsule Take 1 capsule (500 mg total) by mouth 2 (two) times daily for 7 days. 11/03/18 11/10/18  Valta Dillon S, PA-C  clonazePAM (KLONOPIN) 0.5 MG tablet Take 1.5 tablets (0.75 mg total) by mouth 3 (three) times daily. 11/27/17   Narda Bonds, MD  feeding supplement, ENSURE ENLIVE, (ENSURE ENLIVE) LIQD  Take 237 mLs by mouth 2 (two) times daily between meals. 05/30/18   Darlin Drop, DO  ferrous sulfate 325 (65 FE) MG  tablet Take 325 mg by mouth daily.     [provider]  FLUoxetine (PROZAC) 40 MG capsule Take 80 mg by mouth daily.    [provider]  guaiFENesin (MUCINEX) 600 MG 12 hr tablet Take 600 mg by mouth 2 (two) times daily.    [provider]  hydroxychloroquine (PLAQUENIL) 200 MG tablet Take 200 mg by mouth daily.    [provider]  lactulose (CHRONULAC) 10 GM/15ML solution Take 20 g by mouth daily as needed for mild constipation or moderate constipation.    [provider]  lamoTRIgine (LAMICTAL) 100 MG tablet Take 100 mg by mouth at bedtime.    [provider]  linaclotide (LINZESS) 145 MCG CAPS capsule Take 145 mcg by mouth daily before breakfast.    [provider]  lubiprostone (AMITIZA) 24 MCG capsule Take 1 capsule (24 mcg total) by mouth 2 (two) times daily with a meal. 01/01/17   Esterwood, [name] S, PA-C  meropenem 1 g in sodium chloride 0.9 % 100 mL Inject 1 g into the vein every 8 (eight) hours. 08/21/18   Amin, Loura Halt, MD  Multiple Vitamins-Minerals (CERTA-VITE PO) Take 1 tablet by mouth daily.    [provider]  oxybutynin (DITROPAN) 5 MG tablet Take 5 mg by mouth 4 (four) times daily.  02/20/16   [provider]  oxyCODONE (ROXICODONE) 5 MG immediate release tablet Take 5 mg by mouth every 4 (four) hours as needed for moderate pain or severe pain.    [provider]  oxyCODONE-acetaminophen (PERCOCET/ROXICET) 5-325 MG tablet Take 1 tablet by mouth every 8 (eight) hours as needed for severe pain.    [provider]  phenazopyridine (PYRIDIUM) 200 MG tablet Take 200 mg by mouth 3 (three) times daily as needed for pain.    [provider]  polyethylene glycol powder (GLYCOLAX/MIRALAX) powder Take 1  1/2 dose  daily in 12 ounces of fluid every day. Patient taking differently: Take 22.5 g by mouth daily.  01/01/17   Esterwood, [name] S, PA-C  QUEtiapine (SEROQUEL) 50 MG tablet Take 150 mg  by mouth at bedtime.    [provider]  rivaroxaban (XARELTO) 10 MG TABS tablet Take 10 mg by mouth daily.    [provider]  tiZANidine (ZANAFLEX) 4 MG capsule Take 4 mg by mouth 2 (two) times daily.     [provider]  topiramate (TOPAMAX) 100 MG tablet Take 1 tablet at bedtime 09/17/18   Elveria Rising, NP    Family History Family History  Problem Relation Age of Onset  . Asthma Father   . Colon cancer Maternal Grandmother        Died in her 34's  . Breast cancer Paternal Grandmother        Died in her 64's  . Heart attack Maternal Grandfather        Died in his 51's  . Alzheimer's disease Paternal Grandfather        Died in his 21's  . Breast cancer Mother   . Stomach cancer Neg Hx     Social History Social History   Tobacco Use  . Smoking status: Never Smoker  . Smokeless tobacco: Never Used  Substance Use Topics  . Alcohol use: Yes    Alcohol/week: 1.0 standard drinks    Types: 1  Glasses of wine per week    Comment: Drinks alcohol once a month.  . Drug use: No     Allergies   Morphine   Review of Systems Review of Systems  Constitutional: Negative for fever.  HENT: Negative for congestion and sore throat.   Eyes: Negative for visual disturbance.  Respiratory: Negative for cough and shortness of breath.   Cardiovascular: Negative for chest pain.  Gastrointestinal: Negative for abdominal pain, constipation, diarrhea, nausea and vomiting.  Genitourinary: Negative for hematuria.       Dark urine  Musculoskeletal: Negative for back pain.  Skin: Negative for color change and rash.  Neurological: Positive for headaches. Negative for weakness and numbness.  All other systems reviewed and are negative.    Physical Exam Updated Vital Signs BP 100/71 (BP Location: Right Arm)   Pulse 79   Temp 97.9 F (36.6 C) (Oral)   Resp 18   Wt 47.2 kg   LMP 04/05/2011   SpO2 98%   BMI 21.01 kg/m   Physical Exam  Constitutional:  She appears well-developed and well-nourished. No distress.  HENT:  Head: Normocephalic and atraumatic.  Eyes: Pupils are equal, round, and reactive to light. Conjunctivae and EOM are normal.  Neck: Neck supple.  Cardiovascular: Normal rate and regular rhythm.  No murmur heard. Pulmonary/Chest: Effort normal and breath sounds normal. No respiratory distress.  Abdominal: Soft. There is no tenderness.  Musculoskeletal: She exhibits no edema.  Neurological: She is alert.  Limited secondary to cerebral palsy. Baseline weakness to BUE and BLE, but able to move all extremities with normal sensation throughout. Able to give a coherent history. Able to follow commands. PEERL. No obvious nystagmus.   Skin: Skin is warm and dry.  Psychiatric: She has a normal mood and affect.  Nursing note and vitals reviewed.  ED Treatments / Results  Labs (all labs ordered are listed, but only abnormal results are displayed) Labs Reviewed  URINALYSIS, ROUTINE W REFLEX MICROSCOPIC - Abnormal; Notable for the following components:      Result Value   Color, Urine AMBER (*)    APPearance CLOUDY (*)    Hgb urine dipstick SMALL (*)    Leukocytes, UA LARGE (*)    WBC, UA >50 (*)    Bacteria, UA RARE (*)    All other components within normal limits  URINE CULTURE    EKG None  Radiology No results found.  Procedures Procedures (including critical care time)  Medications Ordered in ED Medications  tiZANidine (ZANAFLEX) tablet 4 mg (has no administration in time range)  baclofen (LIORESAL) tablet 20 mg (has no administration in time range)  diphenhydrAMINE (BENADRYL) injection 25 mg (25 mg Intravenous Given 11/03/18 2115)  dexamethasone (DECADRON) injection 10 mg (10 mg Intravenous Given 11/03/18 2118)  metoCLOPramide (REGLAN) injection 10 mg (10 mg Intravenous Given 11/03/18 2114)  ketorolac (TORADOL) 15 MG/ML injection 10 mg (10 mg Intravenous Given 11/03/18 2113)  sodium chloride 0.9 % bolus 500 mL (0  mLs Intravenous Stopped 11/03/18 2323)     Initial Impression / Assessment and Plan / ED Course  I have reviewed the triage vital signs and the nursing notes.  Pertinent labs & imaging results that were available during my care of the patient were reviewed by me and considered in my medical decision making (see chart for details).     Final Clinical Impressions(s) / ED Diagnoses   Final diagnoses:  Nonintractable headache, unspecified chronicity pattern, unspecified headache type  Urinary tract infection  with hematuria, site unspecified   Patient presenting with frontal headache that began this morning.  Headache appears to be consistent with patient's history of chronic headaches.  She was given migraine medications in the ED and states that her symptoms have resolved.  She is requesting her nighttime medications for her muscle spasms and she was also given a dose of this in the ED.  Her neurologic exam is without gross neurologic deficit though is somewhat limited secondary to her cerebral palsy.  No obvious nystagmus pupil rigid pupils equal round and reactive.  Moving all extremities.  Has generalized weakness to the upper and lower extremities but appears to be at baseline for patient.  No sensory changes bilaterally no unilateral weakness noted.  Head CT felt not to be indicated in this situation given patient's grossly normal neurologic exam and the fact that she does have a history of chronic headaches and this does appear to be consistent with prior headaches.  It is also reassuring that her symptoms resolved after migraine cocktail.  UA was obtained given facilities reports of patient's urine appearing dark.  It does look like she has some leukocytes in the urine.  No nitrites.  Microscopic exam with 21-50 RBCs, greater than 50 white blood cells, rare bacteria.  No signs or symptoms of pyelonephritis.  No fevers.  Given her suprapubic catheter, this may be how her urine appears chronically  however will cover with Keflex prophylactically.  Urine cultures have grown out organisms sensitive to ceftriaxone in the past.  Urine culture was sent today.  I feel that patient is appropriate for outpatient regards to symptoms today do not feel that further work-up or admission the hospital is warranted at this time.  Return precautions discussed.  Patient voices understanding of plan and reasons to return to ED.  Questions answered.  ED Discharge Orders         Ordered    cephALEXin (KEFLEX) 500 MG capsule  2 times daily     11/03/18 2334           Karrie Meres, PA-C 11/03/18 2339    Melene Plan, DO 11/04/18 0002

## 2018-11-03 NOTE — ED Triage Notes (Signed)
Pt with history of migraines and CP. Pt had been complaining to staff all day of a headache, which is a common complaint for her. Staff administered oxycodone with no effectiveness per pt. Staff noted that urine looks "dark" in her catheter bag however they also told EMS that pt doesn't drink much water.

## 2018-11-03 NOTE — Discharge Instructions (Signed)
Your urinalysis looked like you may have a urinary tract infection. We will treat you with antibiotics. Please take as directed.   A culture was sent of your urine today to determine if there is any bacterial growth. If the results of the culture are positive and you require an antibiotic or a change of your prescribed antibiotic you will be contacted by the hospital. If the results are negative you will not be contacted.  Please follow up with your primary care provider within 5-7 days for re-evaluation of your symptoms. Please return to the emergency department for any new or worsening symptoms.

## 2018-11-03 NOTE — ED Notes (Signed)
[facility] Expected date:  Expected time:  Means of arrival:  Comments: EMS wheelchair bound headache

## 2018-11-04 ENCOUNTER — Telehealth (INDEPENDENT_AMBULATORY_CARE_PROVIDER_SITE_OTHER): Payer: Self-pay | Admitting: Family

## 2018-11-04 DIAGNOSIS — N390 Urinary tract infection, site not specified: Secondary | ICD-10-CM | POA: Diagnosis not present

## 2018-11-04 DIAGNOSIS — Z743 Need for continuous supervision: Secondary | ICD-10-CM | POA: Diagnosis not present

## 2018-11-04 DIAGNOSIS — R279 Unspecified lack of coordination: Secondary | ICD-10-CM | POA: Diagnosis not present

## 2018-11-04 DIAGNOSIS — M6281 Muscle weakness (generalized): Secondary | ICD-10-CM | POA: Diagnosis not present

## 2018-11-04 NOTE — Telephone Encounter (Signed)
°  Who's calling (name and relationship to patient) : [name] [name] (patient) Best contact number: [phone] Provider they see: [facility]  Reason for call: Patient called stated she is having really bad muscle spasms.  She stated they are really bad.  Please call.     PRESCRIPTION REFILL ONLY  Name of prescription:  Pharmacy:

## 2018-11-04 NOTE — Telephone Encounter (Signed)
I called and talked to [name] [name]. She complained of worsening spasms over the last week. She said that she doesn't feel that her pump is working because of the strength of the spasms. I offered an appointment to have pump checked tomorrow at 12n, arrival time 1145 and she said that she would have to see if she can arrange transportation and call me back. TG

## 2018-11-04 NOTE — Telephone Encounter (Signed)
I called [name] back. She said that she could not arrange a ride for an appointment for tomorrow. I told [name] that I will watch for cancellations but that otherwise she has an appointment with Dr Sharene SkeansHickling on 11/18/18. [name] also told me that she had a current UTI and I told her that could be a trigger for increased spasticity. She had no further questions at this time. TG

## 2018-11-04 NOTE — Telephone Encounter (Signed)
I reviewed your note and agree thank you.

## 2018-11-04 NOTE — Telephone Encounter (Signed)
Pt called and stated that she would like to speak with Inetta Fermoina.

## 2018-11-05 ENCOUNTER — Other Ambulatory Visit: Payer: Self-pay

## 2018-11-05 DIAGNOSIS — M6281 Muscle weakness (generalized): Secondary | ICD-10-CM | POA: Diagnosis not present

## 2018-11-05 DIAGNOSIS — N390 Urinary tract infection, site not specified: Secondary | ICD-10-CM | POA: Diagnosis not present

## 2018-11-05 NOTE — Patient Outreach (Signed)
[facility] Care Management  11/05/2018  [name] SlickerJudith Obenchain 1976-01-30 [id number]   Telephone Screen Referral Date :11/05/2018 Referral Source:[facility] Census Report Referral Reason: ED Utilization Insurance: Medicare   Outreach attempt # 1 to patient for screening.  No answer.  HIPAA compliant voicemail left with contact information.  Plan: RN Health Coach will send a message to Rhae LernerFarrah Tarpley  RN to follow up with the patient .   Juanell Fairlyraci Wetona Viramontes RN, BSN, [facility] RN Health Coach Disease Management [facility] Direct Dial:  [phone] Fax: [phone]

## 2018-11-06 ENCOUNTER — Other Ambulatory Visit: Payer: Self-pay | Admitting: *Deleted

## 2018-11-06 LAB — URINE CULTURE: Culture: >=100000 — AB

## 2018-11-06 NOTE — Patient Outreach (Signed)
[facility]) Care Management  11/06/2018  [name] SlickerJudith [name] August 08, 1976 [id number]   Telephone Screen  Referral Date:11/05/2018 Referral Source:[facility] Census Report Referral Reason: ED Utilization Insurance: Medicare   Outreach Attempt:  Per chart review, [facility].  Plan:  RN Health Coach will close case based on patient enrolled in external program.  Rhae LernerFarrah Marry Kusch RN [facility] Care Management  RN Health Coach [phone] [email or web address]

## 2018-11-07 DIAGNOSIS — N390 Urinary tract infection, site not specified: Secondary | ICD-10-CM | POA: Diagnosis not present

## 2018-11-07 DIAGNOSIS — M6281 Muscle weakness (generalized): Secondary | ICD-10-CM | POA: Diagnosis not present

## 2018-11-09 DIAGNOSIS — F458 Other somatoform disorders: Secondary | ICD-10-CM | POA: Diagnosis not present

## 2018-11-09 DIAGNOSIS — Z9359 Other cystostomy status: Secondary | ICD-10-CM | POA: Diagnosis not present

## 2018-11-09 DIAGNOSIS — N390 Urinary tract infection, site not specified: Secondary | ICD-10-CM | POA: Diagnosis not present

## 2018-11-09 DIAGNOSIS — R51 Headache: Secondary | ICD-10-CM | POA: Diagnosis not present

## 2018-11-09 DIAGNOSIS — M6281 Muscle weakness (generalized): Secondary | ICD-10-CM | POA: Diagnosis not present

## 2018-11-09 DIAGNOSIS — N3000 Acute cystitis without hematuria: Secondary | ICD-10-CM | POA: Diagnosis not present

## 2018-11-10 DIAGNOSIS — M6281 Muscle weakness (generalized): Secondary | ICD-10-CM | POA: Diagnosis not present

## 2018-11-10 DIAGNOSIS — N390 Urinary tract infection, site not specified: Secondary | ICD-10-CM | POA: Diagnosis not present

## 2018-11-11 ENCOUNTER — Ambulatory Visit: Payer: Self-pay | Admitting: *Deleted

## 2018-11-11 DIAGNOSIS — N390 Urinary tract infection, site not specified: Secondary | ICD-10-CM | POA: Diagnosis not present

## 2018-11-11 DIAGNOSIS — M6281 Muscle weakness (generalized): Secondary | ICD-10-CM | POA: Diagnosis not present

## 2018-11-11 DIAGNOSIS — Z79899 Other long term (current) drug therapy: Secondary | ICD-10-CM | POA: Diagnosis not present

## 2018-11-11 DIAGNOSIS — M542 Cervicalgia: Secondary | ICD-10-CM | POA: Diagnosis not present

## 2018-11-12 DIAGNOSIS — N390 Urinary tract infection, site not specified: Secondary | ICD-10-CM | POA: Diagnosis not present

## 2018-11-12 DIAGNOSIS — M6281 Muscle weakness (generalized): Secondary | ICD-10-CM | POA: Diagnosis not present

## 2018-11-13 DIAGNOSIS — I2699 Other pulmonary embolism without acute cor pulmonale: Secondary | ICD-10-CM | POA: Diagnosis not present

## 2018-11-13 DIAGNOSIS — M6281 Muscle weakness (generalized): Secondary | ICD-10-CM | POA: Diagnosis not present

## 2018-11-13 DIAGNOSIS — G8929 Other chronic pain: Secondary | ICD-10-CM | POA: Diagnosis not present

## 2018-11-13 DIAGNOSIS — N318 Other neuromuscular dysfunction of bladder: Secondary | ICD-10-CM | POA: Diagnosis not present

## 2018-11-13 DIAGNOSIS — N390 Urinary tract infection, site not specified: Secondary | ICD-10-CM | POA: Diagnosis not present

## 2018-11-13 DIAGNOSIS — G808 Other cerebral palsy: Secondary | ICD-10-CM | POA: Diagnosis not present

## 2018-11-14 DIAGNOSIS — N390 Urinary tract infection, site not specified: Secondary | ICD-10-CM | POA: Diagnosis not present

## 2018-11-14 DIAGNOSIS — M6281 Muscle weakness (generalized): Secondary | ICD-10-CM | POA: Diagnosis not present

## 2018-11-16 DIAGNOSIS — M6281 Muscle weakness (generalized): Secondary | ICD-10-CM | POA: Diagnosis not present

## 2018-11-16 DIAGNOSIS — N390 Urinary tract infection, site not specified: Secondary | ICD-10-CM | POA: Diagnosis not present

## 2018-11-17 DIAGNOSIS — M6281 Muscle weakness (generalized): Secondary | ICD-10-CM | POA: Diagnosis not present

## 2018-11-17 DIAGNOSIS — N390 Urinary tract infection, site not specified: Secondary | ICD-10-CM | POA: Diagnosis not present

## 2018-11-18 ENCOUNTER — Observation Stay (HOSPITAL_COMMUNITY)
Admission: EM | Admit: 2018-11-18 | Discharge: 2018-11-19 | Disposition: A | Discharge status: Skilled Nursing Facility | Payer: Medicare Other | Attending: Internal Medicine | Admitting: Internal Medicine

## 2018-11-18 ENCOUNTER — Emergency Department (HOSPITAL_COMMUNITY): Payer: Medicare Other

## 2018-11-18 ENCOUNTER — Ambulatory Visit (INDEPENDENT_AMBULATORY_CARE_PROVIDER_SITE_OTHER): Payer: Medicare Other | Admitting: Pediatrics

## 2018-11-18 ENCOUNTER — Encounter (INDEPENDENT_AMBULATORY_CARE_PROVIDER_SITE_OTHER): Payer: Self-pay | Admitting: Pediatrics

## 2018-11-18 ENCOUNTER — Encounter (HOSPITAL_COMMUNITY): Payer: Self-pay | Admitting: Emergency Medicine

## 2018-11-18 VITALS — BP 76/40

## 2018-11-18 DIAGNOSIS — Z79899 Other long term (current) drug therapy: Secondary | ICD-10-CM | POA: Diagnosis not present

## 2018-11-18 DIAGNOSIS — N390 Urinary tract infection, site not specified: Principal | ICD-10-CM

## 2018-11-18 DIAGNOSIS — R569 Unspecified convulsions: Secondary | ICD-10-CM

## 2018-11-18 DIAGNOSIS — G249 Dystonia, unspecified: Secondary | ICD-10-CM

## 2018-11-18 DIAGNOSIS — T83511A Infection and inflammatory reaction due to indwelling urethral catheter, initial encounter: Secondary | ICD-10-CM | POA: Diagnosis present

## 2018-11-18 DIAGNOSIS — I959 Hypotension, unspecified: Secondary | ICD-10-CM | POA: Diagnosis not present

## 2018-11-18 DIAGNOSIS — Z79891 Long term (current) use of opiate analgesic: Secondary | ICD-10-CM | POA: Insufficient documentation

## 2018-11-18 DIAGNOSIS — Z86711 Personal history of pulmonary embolism: Secondary | ICD-10-CM | POA: Insufficient documentation

## 2018-11-18 DIAGNOSIS — A419 Sepsis, unspecified organism: Secondary | ICD-10-CM | POA: Diagnosis not present

## 2018-11-18 DIAGNOSIS — I2699 Other pulmonary embolism without acute cor pulmonale: Secondary | ICD-10-CM | POA: Diagnosis present

## 2018-11-18 DIAGNOSIS — Z9359 Other cystostomy status: Secondary | ICD-10-CM

## 2018-11-18 DIAGNOSIS — M6281 Muscle weakness (generalized): Secondary | ICD-10-CM | POA: Diagnosis not present

## 2018-11-18 DIAGNOSIS — G809 Cerebral palsy, unspecified: Secondary | ICD-10-CM | POA: Diagnosis not present

## 2018-11-18 DIAGNOSIS — G894 Chronic pain syndrome: Secondary | ICD-10-CM | POA: Diagnosis not present

## 2018-11-18 DIAGNOSIS — R4182 Altered mental status, unspecified: Secondary | ICD-10-CM | POA: Diagnosis present

## 2018-11-18 DIAGNOSIS — Z7901 Long term (current) use of anticoagulants: Secondary | ICD-10-CM

## 2018-11-18 DIAGNOSIS — I10 Essential (primary) hypertension: Secondary | ICD-10-CM | POA: Diagnosis not present

## 2018-11-18 DIAGNOSIS — G808 Other cerebral palsy: Secondary | ICD-10-CM | POA: Diagnosis present

## 2018-11-18 DIAGNOSIS — R5383 Other fatigue: Secondary | ICD-10-CM | POA: Diagnosis present

## 2018-11-18 DIAGNOSIS — G801 Spastic diplegic cerebral palsy: Secondary | ICD-10-CM | POA: Diagnosis not present

## 2018-11-18 DIAGNOSIS — R5381 Other malaise: Secondary | ICD-10-CM | POA: Diagnosis not present

## 2018-11-18 LAB — COMPREHENSIVE METABOLIC PANEL
ALK PHOS: 66 U/L (ref 38–126)
ALT: 21 U/L (ref 0–44)
AST: 24 U/L (ref 15–41)
Albumin: 3.8 g/dL (ref 3.5–5.0)
Anion gap: 11 (ref 5–15)
BUN: 11 mg/dL (ref 6–20)
CO2: 23 mmol/L (ref 22–32)
Calcium: 8.7 mg/dL — ABNORMAL LOW (ref 8.9–10.3)
Chloride: 105 mmol/L (ref 98–111)
Creatinine, Ser: 0.76 mg/dL (ref 0.44–1.00)
GFR calc Af Amer: >60 mL/min (ref >60)
GFR calc non Af Amer: >60 mL/min (ref >60)
Glucose, Bld: 84 mg/dL (ref 70–99)
Potassium: 4.4 mmol/L (ref 3.5–5.1)
Sodium: 139 mmol/L (ref 135–145)
Total Bilirubin: 0.6 mg/dL (ref 0.3–1.2)
Total Protein: 6.6 g/dL (ref 6.5–8.1)

## 2018-11-18 LAB — URINALYSIS, ROUTINE W REFLEX MICROSCOPIC
Bilirubin Urine: NEGATIVE
Glucose, UA: NEGATIVE mg/dL
KETONES UR: NEGATIVE mg/dL
Nitrite: POSITIVE — AB
PH: 7 (ref 5.0–8.0)
Protein, ur: NEGATIVE mg/dL
Specific Gravity, Urine: 1.012 (ref 1.005–1.030)

## 2018-11-18 LAB — CBC WITH DIFFERENTIAL/PLATELET
Abs Immature Granulocytes: 0.03 10*3/uL (ref 0.00–0.07)
Basophils Absolute: 0 10*3/uL (ref 0.0–0.1)
Basophils Relative: 0 %
EOS PCT: 11 %
Eosinophils Absolute: 0.7 10*3/uL — ABNORMAL HIGH (ref 0.0–0.5)
HCT: 37.3 % (ref 36.0–46.0)
Hemoglobin: 11.5 g/dL — ABNORMAL LOW (ref 12.0–15.0)
Immature Granulocytes: 1 %
Lymphocytes Relative: 36 %
Lymphs Abs: 2.3 10*3/uL (ref 0.7–4.0)
MCH: 30.6 pg (ref 26.0–34.0)
MCHC: 30.8 g/dL (ref 30.0–36.0)
MCV: 99.2 fL (ref 80.0–100.0)
Monocytes Absolute: 0.6 10*3/uL (ref 0.1–1.0)
Monocytes Relative: 9 %
Neutro Abs: 2.7 10*3/uL (ref 1.7–7.7)
Neutrophils Relative %: 43 %
Platelets: 204 10*3/uL (ref 150–400)
RBC: 3.76 MIL/uL — ABNORMAL LOW (ref 3.87–5.11)
RDW: 13.9 % (ref 11.5–15.5)
WBC: 6.3 10*3/uL (ref 4.0–10.5)
nRBC: 0 % (ref 0.0–0.2)

## 2018-11-18 LAB — I-STAT CG4 LACTIC ACID, ED: Lactic Acid, Venous: 1.08 mmol/L (ref 0.5–1.9)

## 2018-11-18 MED ORDER — OXYCODONE HCL 5 MG PO TABS
5.0000 mg | ORAL_TABLET | ORAL | Status: DC | PRN
  Administered 2018-11-18 – 2018-11-19 (×2): 5 mg via ORAL
  Filled 2018-11-18 (×3): qty 1

## 2018-11-18 MED ORDER — BUSPIRONE HCL 5 MG PO TABS
5.0000 mg | ORAL_TABLET | Freq: Once | ORAL | Status: AC
  Administered 2018-11-18: 5 mg via ORAL
  Filled 2018-11-18: qty 1

## 2018-11-18 MED ORDER — SODIUM CHLORIDE 0.9 % IV BOLUS (SEPSIS)
500.0000 mL | Freq: Once | INTRAVENOUS | Status: AC
  Administered 2018-11-18: 500 mL via INTRAVENOUS

## 2018-11-18 MED ORDER — ONDANSETRON HCL 4 MG PO TABS: 4.0000 mg | ORAL_TABLET | Freq: Four times a day (QID) | ORAL | Status: DC | PRN

## 2018-11-18 MED ORDER — QUETIAPINE FUMARATE 50 MG PO TABS
150.0000 mg | ORAL_TABLET | Freq: Once | ORAL | Status: AC
  Administered 2018-11-18: 150 mg via ORAL
  Filled 2018-11-18: qty 3

## 2018-11-18 MED ORDER — OXYBUTYNIN CHLORIDE 5 MG PO TABS
5.0000 mg | ORAL_TABLET | Freq: Once | ORAL | Status: AC
  Administered 2018-11-18: 5 mg via ORAL
  Filled 2018-11-18: qty 1

## 2018-11-18 MED ORDER — ONDANSETRON HCL 4 MG/2ML IJ SOLN: 4.0000 mg | Freq: Four times a day (QID) | INTRAMUSCULAR | Status: DC | PRN

## 2018-11-18 MED ORDER — SODIUM CHLORIDE 0.9 % IV SOLN
INTRAVENOUS | Status: DC | PRN
  Administered 2018-11-18: 250 mL via INTRAVENOUS

## 2018-11-18 MED ORDER — TOPIRAMATE 100 MG PO TABS
100.0000 mg | ORAL_TABLET | Freq: Once | ORAL | Status: AC
  Administered 2018-11-18: 100 mg via ORAL
  Filled 2018-11-18: qty 1

## 2018-11-18 MED ORDER — CLONAZEPAM 1 MG PO TABS
1.5000 mg | ORAL_TABLET | Freq: Once | ORAL | Status: AC
  Administered 2018-11-18: 1.5 mg via ORAL
  Filled 2018-11-18: qty 1

## 2018-11-18 MED ORDER — SODIUM CHLORIDE 0.9 % IV SOLN
1.0000 g | Freq: Two times a day (BID) | INTRAVENOUS | Status: DC
  Administered 2018-11-18 – 2018-11-19 (×3): 1 g via INTRAVENOUS
  Filled 2018-11-18 (×3): qty 1

## 2018-11-18 MED ORDER — TIZANIDINE HCL 2 MG PO TABS
4.0000 mg | ORAL_TABLET | Freq: Once | ORAL | Status: AC
  Administered 2018-11-18: 4 mg via ORAL
  Filled 2018-11-18: qty 2

## 2018-11-18 MED ORDER — ACETAMINOPHEN 650 MG RE SUPP: 650.0000 mg | Freq: Four times a day (QID) | RECTAL | Status: DC | PRN

## 2018-11-18 MED ORDER — GUAIFENESIN ER 600 MG PO TB12
600.0000 mg | ORAL_TABLET | Freq: Once | ORAL | Status: AC
  Administered 2018-11-18: 600 mg via ORAL
  Filled 2018-11-18: qty 1

## 2018-11-18 MED ORDER — LAMOTRIGINE 100 MG PO TABS
100.0000 mg | ORAL_TABLET | Freq: Once | ORAL | Status: AC
  Administered 2018-11-18: 100 mg via ORAL
  Filled 2018-11-18 (×2): qty 1

## 2018-11-18 MED ORDER — SODIUM CHLORIDE 0.9 % IV BOLUS (SEPSIS)
1000.0000 mL | Freq: Once | INTRAVENOUS | Status: AC
  Administered 2018-11-18: 1000 mL via INTRAVENOUS

## 2018-11-18 MED ORDER — BACLOFEN 10 MG PO TABS
20.0000 mg | ORAL_TABLET | Freq: Three times a day (TID) | ORAL | Status: DC
  Administered 2018-11-18 – 2018-11-19 (×2): 20 mg via ORAL
  Filled 2018-11-18 (×2): qty 2

## 2018-11-18 MED ORDER — ACETAMINOPHEN 325 MG PO TABS: 650.0000 mg | ORAL_TABLET | Freq: Four times a day (QID) | ORAL | Status: DC | PRN

## 2018-11-18 NOTE — ED Notes (Signed)
Pharm called for antibiotic

## 2018-11-18 NOTE — H&P (Signed)
History and Physical    [name] [name]:[id number] DOB: 07-13-1976 DOA: 11/18/2018  PCP: Justin Mend, MD Consultants:  Logan Bores - urology; Hickling - neuro; Wert - pulmonology; Kirsteins- PM&R Patient coming from: [facility]; NOK:   Chief Complaint: AMS  HPI: [name] [name] is a 42 y.o. female with medical history significant of cerebral palsy with contractures, spasticity (implanted baclofen pump), and recurrent UTIs (including VRE) with suprapubic catheter placement; HTN; and recurrent PE with IVC filter and on lifelong Coumadin presenting with AMS.  She is extremely difficult to understand.   She was sent by the pediatric neurology office for evaluation after BP was 70/40 and she appeared lethargic.  She is at a facility ([facility] vs. [name]) and is due to be discharged Monday; she will be living with an aide, according to her description.  ED Course:  Sent by neurologist for hypotension and confusion.  BP 70/40 in their office.  Has suprapubic cath and pus in catheter tube.  H/o VRE UTI - pharmacy recommends Meropenem q12h.  Sepsis protocol initiated but not clearly with sepsis.  Still with hypotension.  Given 30 cc/kg bolus.  BP 90/50-60s.  Baseline appears to be 100-110/60-70.  Review of Systems: Unable to perform  Ambulatory Status:  Non-ambulatory  Past Medical History:  Diagnosis Date  . Acute respiratory failure with hypoxia ([name])   . Anemia   . Cellulitis 12/22/2014  . Cerebral palsy ([name])    Spastic Cerebral palsy, mentally intact  . Contracture, joint, multiple sites    Electric wheelchair, uses left hand to operate chair.   . Depression   . [name]   . Dysarthria   . GERD (gastroesophageal reflux disease)   . Gram positive sepsis ([name]) 10/24/2014  . HCAP (healthcare-associated pneumonia)    2015, 2018  . History of endometriosis 10/2012   OBGYN [facility]: Laparoscopic Endometrial Ablation, TVH,  . History of recurrent UTIs    . Hypertension   . Incontinent of feces 03/11/2016  . Intracervical pessary 05/07/2011   Overview:  Overview:  Placed by [facility]YN 04/30/11 to treat DUB and Endometriosis.  She is seen at GYN clinic at [facility] but was considered a poor surgical candidate and referred her to [facility].  For DUB she had pelvic ultrasound that showed thin stripe and she had endometrial Bx by Dr Jennette Kettle in GYN clinic, which was negative.     . Macroscopic hematuria 03/10/2013   status post replacement of suprapubic tube 03/04/2013   . Migraine   . OCD (obsessive compulsive disorder)   . Parapneumonic effusion 10/25/2014  . Presence of intrathecal baclofen pump 03/07/2014   R LQ. Insertion T10.    [name] Kitchen Psychiatric illness 10/31/2014  . Recurrent pulmonary embolism ([name])    Pt has history of recurrent PE and is on lifelong coumadin.   . Seizures ([name]) 02/12/2016  . Spasticity 01/05/2014  . Transient alteration of awareness, recurrent 09/08/2006   Recurrent episodes where Weston  loses contact with her world and that have been studied thoroughly and appear nonepileptic in nature per Dr Terrace Arabia (neurology) The patient has had episodes starting in 2007 that initially began 1-4 times a day during which time she would have periods of unawareness followed by confusion. This continuous video EEG monitoring performed from October 8-12, 2007. Had   . Urethra dilated and patulous 07/03/2015   Patulous, Dilated Urethra. 5mL balloon on a 22-Fr catheter hoping this would prevent the bladder spasm from  pushing the balloon down the urethra (Dr Logan Bores, Urology [facility], July 2016)     Past Surgical History:  Procedure Laterality Date  . ABDOMINAL HYSTERECTOMY    . APPENDECTOMY    . BACLOFEN PUMP REFILL     x 3 times  . CARPAL TUNNEL RELEASE  08/2008   Dr Teressa Senter  . CESAREAN SECTION     x 2  . CHOLECYSTECTOMY  10/14/2015   Procedure: LAPAROSCOPIC CHOLECYSTECTOMY;  Surgeon: Violeta Gelinas, MD;  Location: [facility] OR;  Service: General;;  . COLPOSCOPY   06/2000  . INTRAUTERINE DEVICE INSERTION  04/30/11   Inserted by [facility]YN for endometriosis  . LAPAROSCOPIC ASSISTED VAGINAL HYSTERECTOMY  10/27/2012  . MULTIPLE EXTRACTIONS WITH ALVEOLOPLASTY Bilateral 07/19/2013   Procedure: EXTRACTIONS #4, 1,61,09,60;  Surgeon: Francene Finders, DDS;  Location[facility] OR;  Service: Oral Surgery;  Laterality: Bilateral;  . NEUROMA SURGERY Left    anterior and posterior intersosseous  . PAIN PUMP IMPLANTATION N/A 03/07/2014   Procedure: baclofen pump revision/replacement and Catheter connection replacement;  Surgeon: Cristi Loron, MD;  Location: [facility];  Service: Neurosurgery;  Laterality: N/A;  baclofen pump revision/replacement and Catheter connection replacement  . PROGRAMABLE BACLOFEN PUMP REVISION  03/17/14   Battery Replacement   . TUBAL LIGATION  2003  . URETHRA SURGERY    . WRIST SURGERY  06/2010   Dr Dierdre Searles, hand surgeon, [facility]    Social History   Socioeconomic History  . Marital status: Divorced    Spouse name: Not on file  . Number of children: 2  . Years of education: college  . Highest education level: Not on file  Occupational History    Employer: UNEMPLOYED  Social Needs  . Financial resource strain: Not on file  . Food insecurity:    Worry: Not on file    Inability: Not on file  . Transportation needs:    Medical: Not on file    Non-medical: Not on file  Tobacco Use  . Smoking status: Never Smoker  . Smokeless tobacco: Never Used  Substance and Sexual Activity  . Alcohol use: Yes    Alcohol/week: 1.0 standard drinks    Types: 1 Glasses of wine per week    Comment: Drinks alcohol once a month.  . Drug use: No  . Sexual activity: Never  Lifestyle  . Physical activity:    Days per week: Not on file    Minutes per session: Not on file  . Stress: Not on file  Relationships  . Social connections:    Talks on phone: Not on file    Gets together: Not on file    Attends religious service: Not on file    Active  member of club or organization: Not on file    Attends meetings of clubs or organizations: Not on file    Relationship status: Not on file  . Intimate partner violence:    Fear of current or ex partner: Not on file    Emotionally abused: Not on file    Physically abused: Not on file    Forced sexual activity: Not on file  Other Topics Concern  . 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    She enjoys shopping.   Lives in a nursing facility,[facility] since 02/11/16      Ailene Ards, who has schizophrenia, MR, is father of daughter.  Homero Fellers and  pt are no longer together.  Homero Fellers does not see [name]. Daughter is 31 yo, Levy Sjogren, lives in a group home in [place].    [name]'s mother reportedly has breast cancer.    Needs assistance with ADL's and IADL's--has personal care services 20 hrs/wk;       No tobacco, EtOH, drugs.      **Case Manager: Jack Quarto [phone]      Patient lives [facility]    Allergies  Allergen Reactions  . Morphine Dermatitis and Other (See Comments)    Skin turned red Other reaction(s): Other (See Comments) Skin turned red    Family History  Problem Relation Age of Onset  . Asthma Father   . Colon cancer Maternal Grandmother        Died in her 34's  . Breast cancer Paternal Grandmother        Died in her 22's  . Heart attack Maternal Grandfather        Died in his 23's  . Alzheimer's disease Paternal Grandfather        Died in his 65's  . Breast cancer Mother   . Stomach cancer Neg Hx     Prior to Admission medications   Medication Sig Start Date End Date Taking? Authorizing Provider  acetaminophen (TYLENOL 8 HOUR) 650 MG CR tablet Take 1 tablet (650 mg total) by mouth every 8 (eight) hours as needed. Patient taking differently: Take 650 mg by mouth every 8 (eight) hours as needed for pain.  07/08/18   Derwood Kaplan, MD    amantadine (SYMMETREL) 100 MG capsule Take 100 mg by mouth daily.     [provider]  baclofen (LIORESAL) 20 MG tablet Take 20 mg by mouth 4 (four) times daily. For bladder spasms    [provider]  bisacodyl (DULCOLAX) 10 MG suppository Place 10 mg rectally 2 (two) times daily as needed for moderate constipation.     [provider]  Buprenorphine HCl (BELBUCA) 450 MCG FILM Place 450 mcg inside cheek every 12 (twelve) hours. 08/22/18   Amin, Loura Halt, MD  busPIRone (BUSPAR) 5 MG tablet Take 5 mg by mouth 3 (three) times daily.    [provider]  butalbital-acetaminophen-caffeine (FIORICET, ESGIC) 50-325-40 MG tablet Take 2 tablets by mouth every 4 (four) hours as needed for headache (not controlled by Tylenol). 05/29/18   Darlin Drop, DO  clonazePAM (KLONOPIN) 0.5 MG tablet Take 1.5 tablets (0.75 mg total) by mouth 3 (three) times daily. 11/27/17   Narda Bonds, MD  feeding supplement, ENSURE ENLIVE, (ENSURE ENLIVE) LIQD Take 237 mLs by mouth 2 (two) times daily between meals. 05/30/18   Darlin Drop, DO  ferrous sulfate 325 (65 FE) MG tablet Take 325 mg by mouth daily.     [provider]  FLUoxetine (PROZAC) 40 MG capsule Take 80 mg by mouth daily.    [provider]  guaiFENesin (MUCINEX) 600 MG 12 hr tablet Take 600 mg by mouth 2 (two) times daily.    [provider]  hydroxychloroquine (PLAQUENIL) 200 MG tablet Take 200 mg by mouth daily.    [provider]  lactulose (CHRONULAC) 10 GM/15ML solution Take 20 g by mouth daily as needed for mild constipation or moderate constipation.    [provider]  lamoTRIgine (LAMICTAL) 100 MG tablet Take 100 mg by mouth at bedtime.    [provider]  linaclotide (LINZESS) 145 MCG CAPS capsule Take 145 mcg by mouth  daily before breakfast.    [provider]  lubiprostone (AMITIZA) 24 MCG capsule Take 1 capsule (24 mcg total) by mouth 2 (two) times  daily with a meal. 01/01/17   Esterwood, [name] S, PA-C  meropenem 1 g in sodium chloride 0.9 % 100 mL Inject 1 g into the vein every 8 (eight) hours. 08/21/18   Amin, Loura HaltAnkit Chirag, MD  Multiple Vitamins-Minerals (CERTA-VITE PO) Take 1 tablet by mouth daily.    [provider]  oxybutynin (DITROPAN) 5 MG tablet Take 5 mg by mouth 4 (four) times daily.  02/20/16   [provider]  oxyCODONE (ROXICODONE) 5 MG immediate release tablet Take 5 mg by mouth every 4 (four) hours as needed for moderate pain or severe pain.    [provider]  oxyCODONE-acetaminophen (PERCOCET/ROXICET) 5-325 MG tablet Take 1 tablet by mouth every 8 (eight) hours as needed for severe pain.    [provider]  phenazopyridine (PYRIDIUM) 200 MG tablet Take 200 mg by mouth 3 (three) times daily as needed for pain.    [provider]  polyethylene glycol powder (GLYCOLAX/MIRALAX) powder Take 1  1/2 dose  daily in 12 ounces of fluid every day. Patient taking differently: Take 22.5 g by mouth daily.  01/01/17   Esterwood, [name] S, PA-C  QUEtiapine (SEROQUEL) 50 MG tablet Take 150 mg by mouth at bedtime.    [provider]  rivaroxaban (XARELTO) 10 MG TABS tablet Take 10 mg by mouth daily.    [provider]  tiZANidine (ZANAFLEX) 4 MG capsule Take 4 mg by mouth 2 (two) times daily.     [provider]  topiramate (TOPAMAX) 100 MG tablet Take 1 tablet at bedtime 09/17/18   Elveria RisingGoodpasture, Tina, NP    Physical Exam: Vitals:   11/18/18 1518 11/18/18 1627 11/18/18 1637 11/18/18 1645  BP: (!) 90/58 (!) 85/50 (!) 84/60 99/81  Pulse: (!) 57 (!) 58  62  Resp: 16 16  13   Temp:      TempSrc:      SpO2: 95% 97%  97%     General:  Appears calm and comfortable and is NAD Eyes:  PERRL, EOMI, normal lids, iris ENT:  grossly normal hearing, lips & tongue, mmm; poor dentition Neck:  no LAD, masses or thyromegaly Cardiovascular:  RRR, no m/r/g. No LE edema.  Respiratory:   CTA  bilaterally with no wheezes/rales/rhonchi.  Normal respiratory effort. Abdomen:  soft, NT, ND, NABS; baclofen pump is present in RLQ; suprapubic catheter is in place with normal-appearing granulomatous tissue but no apparent purulence from the catheter itself Skin:  no rash or induration seen on limited exam Musculoskeletal: diffuse contractures, difficulty doing things for herself but appears independent Psychiatric: very dysarthric speech at apparent baseline Neurologic: unable to perform    Radiological Exams on Admission: Dg Chest Port 1 View  Result Date: 11/18/2018 CLINICAL DATA:  Sepsis EXAM: PORTABLE CHEST 1 VIEW COMPARISON:  08/30/2018 FINDINGS: There is no focal parenchymal opacity. There is no pleural effusion or pneumothorax. The heart and mediastinal contours are unremarkable. There is anterior and posterior cervicothoracic fusion. There is no acute osseous abnormality. There is gaseous distension of the colon. IMPRESSION: No acute cardiopulmonary disease. Electronically Signed   By: Elige KoHetal  Patel   On: 11/18/2018 14:25    EKG: Independently reviewed.  NSR with rate 70; IVCD; nonspecific ST changes with no evidence of acute ischemia   Labs on Admission: I have personally reviewed the available labs and  imaging studies at the time of the admission.  Pertinent labs:   Unremarkable CMP WBC 6.3 Hgb 11.5; 13.3 on 9/29 Lactate 1.08 UA: small Hgb, large LE, + nitrite, many bacteria Blood and urine cultures pending  Assessment/Plan Principal Problem:   Complicated UTI (urinary tract infection) Active Problems:   Chronic suprapubic catheter ([name])   Recurrent pulmonary embolism ([name])   Chronic pain syndrome   Cerebral palsy, quadriplegic ([name])   Seizures ([name])   Chronic anticoagulation   Complicated UTI with suprapubic catheter in place -Patient with chronic suprapubic catheter presenting with hypotension and decreased mentation -She has chronically low BP in 100/60 range  but was lower today -BP has improved with hydration, no other SIRS criteria to raise concern for sepsis -Prior UCx:   09/08/18 - Proteus sensitive to Cefazolin, Ceftriaxone, Gent, Imipenem, and Zosyn  09/01/18 - VRE sensitive to Amp, Linezolid, and Nitrofurantoin  01/25/18 - E coli, ESBL, sensitive to Road Runner, Imipenem, and Zosyn  11/29/17 - Proteus resistant only to Nitrofurantoin -Based on these results, pharmacy has recommended coverage with Meropenem -Will admit, since the patient is likely to need ongoing monitoring and IV antibiotics on an ongoing basis  Cerebral palsy -She is severely physically affected, although notes report that she is cognitively intact -Continue Baclofen by pump and PO as well as Zanaflex -She takes several psychiatric medications, including Buspar, Klonopin, Seroquel, and Prozac  Recurrent PE, on AC -On Plaquenil -Continue Xarelto  Chronic pain -Buprenorphine, Oxy IR, Oxycodone, and Percocet -She has significant constipation as a result of all of these opiates and takes many GI medications -It may be beneficial to attempt to wean some of these medications, if possible -Will not give additional IV pain medications  Seizures -Continue Lamictal and Topamax    DVT prophylaxis: Xarelto Code Status:  Full - confirmed with patient Family Communication: None present Disposition Plan:  Back to facility once clinically improved Consults called: None Admission status: Admit - It is my clinical opinion that admission to INPATIENT is reasonable and necessary because of the expectation that this patient will require hospital care that crosses at least 2 midnights to treat this condition based on the medical complexity of the problems presented.  Given the aforementioned information, the predictability of an adverse outcome is felt to be significant.    Jonah Blue MD [facility]  If note is complete, please contact covering daytime or nighttime  physician. [email or web address] Password [email or web address]  11/18/2018, 5:01 PM

## 2018-11-18 NOTE — Progress Notes (Signed)
Patient: [name] [name] MRN: [id number] Sex: female DOB: Dec 12, 1975  Provider: Ellison Carwin, MD Location of Care: [facility]  Note type: Routine return visit  History of Present Illness: Referral Source: Melanie March, MD History from: patient Chief Complaint: Baclofen Pump Refill  [name] [name] is a 42 y.o. 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 She was last seen in neurology clinic by Elveria Rising, NP on 09/17/18. At this time [name] [name] felt that her topiramate was working well for migraine prevention but she reported feeling unusually fatigued with naps during the day, and also reported feeling depressed due to her chronic health problems and living at [facility]. She has an indwelling Foley catheter and was treated for a UTI in early October.   Today, she tells me that she is tired, looks pale, and lacks the usual level of alertness that is baseline for her.  She will be moving into an independent apartment next week with assistance from her CNA. Her new apartment will be wheelchair accessible.   She denies burning with urination.   Procedure: Reprogramming intrathecal baclofen pump  The device was interrogated and showed a complex continuous infusion of baclofen that has a basal rate of23. per hour. The patient receives bolus doses of 27 mcg delivered at midnight, 6 AM, 12 noon, and 6 PM, for total of525. per day.  She was sterilely prepped and draped. A 1-1/2 inch 22-gauge noncoring Huebner needle was inserted on the first pass. The apex of the reservoir is going horizontally to the left. 4.46mL was withdrawn from the pump placing it under partial vacuum. 40 mL of baclofen (concentration 2000 mcg/mL) was instilled into the pump through a Millipore filter. She tolerated the procedure well.  Her reservoir alarm date is Apr 08, 2019.  ERI is 24months.    At the conclusion of the procedure, because she looked ill a blood pressure was performed and is reported below.  Review of Systems: A complete review of systems was remarkable for being tired and weak, facial pallor, malaise, and increasing slurred speech..  Past Medical History Diagnosis Date  . Abdominal pain 01/02/2012   Overview:  Overview:  Per Previous pcp note- Dr. Ta--Pt has history of endometriosis and had DUB in 2011.  She She had u/s that showed normal endometrial stripe.  She had endometrial bx that was wnl.  She has a lot of abd cramping every month.  This cramping was sometimes not relieved with hydrocodone.  She was referred to [facility] for IUD placement to help endometriosis and abd cramping.  [name]  . Abdominal pain 01/02/2012   Overview:  Overview:  Per Previous pcp note- Dr. Ta--Pt has history of endometriosis and had DUB in 2011.  She She had u/s that showed normal endometrial stripe.  She had endometrial bx that was wnl.  She has a lot of abd cramping every month.  This cramping was sometimes not relieved with hydrocodone.  She was referred to [facility] for IUD placement to help endometriosis and abd cramping.  [name] was placed 04/30/11 by [facility].  Pt still complains of abd cramping, which I am treating with hydrocodone and ultram. Pt reports some initial relief of pain from IUD, but states it has now returned back to similar to previous cramping. Gave patient a shot of Toradol 60 mg IM today-since positive exacerbation of abdominal cramping during past 2 weeks. Physical exam reassuring. Patient may  [facility] since she is plugged in with the GYN providers there for further treatment options regarding endometriosis and abdominal cramping. The dysfunctional uterine bleeding now well contr  . Acute cystitis with hematuria   . Acute cystitis without hematuria   . Acute respiratory failure with hypoxia ([name])   . Anemia     . Cellulitis 12/22/2014  . Cerebral palsy ([name])    Spastic Cerebral palsy, mentally intact  . Contracture, joint, multiple sites    Electric wheelchair, uses left hand to operate chair.   . Depression   . Disease of female genital organs 10/24/2010   Overview:  Overview:  Pt has history of endometriosis and had DUB in 2011.  She had u/s that showed normal endometrial stripe.  She had endometrial bx that was wnl.  She has a lot of abd cramping every month.  This cramping was sometimes not relieved with hydrocodone.  She was referred to [facility] for IUD placement to help endometriosis and abd cramping.  [name].  Pt still complains of abd cramping, which was treated with hydrocodone and ultram.   [facility] 12/24/11, given Depo Lupron injection.  Ordering CT scan to r/o additional cause of abdominal pain (pt with known endometriosis and will not tolerate vaginal U/S, pain not improved s/p [name] or with Lupron injection-- only caused hot flashes and amenorrhea, no pain relief).  Also made referral to pain clinic for better pain control.  Do not think hysterectomy would help with pain, and concern for contamination of baclofen pump if did proceed with surgery.    8/5/13[name]: [facility]: Pt was seen by   . [name]   . Dysarthria   . Dyspnea and respiratory abnormality 02/04/2011   Overview:  Overview:  Pt has history of recurrent PE and is on chronic coumadin.  Pt has been complaining of dyspnea that she describes as chest tightness.  She has been to the ER multiple times for this.  Usually she gets CTA and serial CE.  She has had at least 6 CTA in a period of since 2011.  She also has an O2 requirement. It is unclear why pt feels dyspneic, complains of chest tightness, or has O2 requirement.  I thought that her complaint may be from deconditioning and referred her to PT.  Unfortunately Medicaid only pays for 4 PT visits.  Pt should be  doing these exercises at home, but she has not.  [facility] and she has been seen by Dr Marchelle Gearingamaswamy.  He did not understand the etiology of her dyspnea and O2 requirement.  He will continue to follow her.   Last Assessment & Plan:  Pt presents with concerns for chest tightness and dyspnea.  She was seen in ED yesterday and discharged home.  While there she had a CT chest 04/20/11 showed 1.  Multifocal degradation as detailed above.  N  . Endometriosis   . GERD (gastroesophageal reflux disease)   . Gram positive sepsis ([name]) 10/24/2014  . Gross hematuria 03/12/2013   Overview:  Overview:  status post replacement of suprapubic tube 03/04/2013  Last Assessment & Plan:  Recent urine culture collected in ED on 5/4 did not result in significant growth. Moreover, patient has been asymptomatic with normal WBC, no fever and no true symptoms making symptomatic UTI less likely.  Will continue to monitor for any sign of  infection.  Patient's xarelto had been held during her recent hospitalization due to hematuria. This has since been re-started. Monitor bleeding.    [name] Kitchen HCAP (healthcare-associated pneumonia) 10/25/2014  . HCAP (healthcare-associated pneumonia) 11/24/2017  . History of anticoagulant therapy 10/21/2012  . History of endometriosis 10/2012   OBGYN [facility]: Laparoscopic Endometrial Ablation, TVH,  . History of recurrent UTIs   . Hypertension   . HYPERTENSION, BENIGN 01/31/2010   Qualifier: Diagnosis of  By: [name], MD, [name] Incontinent of feces 03/11/2016  . Infantile cerebral palsy ([name]) 01/29/2007   Overview:  She has spastic CP.  She is mentally intact.  She is wheelchair bound.  She is wheelchair bound for ambulation      . Intracervical pessary 05/07/2011   Overview:  Overview:  Placed by [facility] 04/30/11 to treat DUB and Endometriosis.  She is seen at GYN clinic at [facility] but was considered a poor surgical candidate and referred her to [facility].  For DUB she had  pelvic ultrasound that showed thin stripe and she had endometrial Bx by Dr Jennette Kettle in GYN clinic, which was negative.     . Macroscopic hematuria 03/10/2013   status post replacement of suprapubic tube 03/04/2013   . Migraine   . OCD (obsessive compulsive disorder)   . Parapneumonic effusion 10/25/2014  . Presence of intrathecal baclofen pump 03/07/2014   R LQ. Insertion T10.    [name] Kitchen Psychiatric illness 10/31/2014  . Pulmonary embolism ([name])    Lifetime Coumadin  . Pulmonary embolism ([name]) 2011, 01/2011   Will be on lifetime coumadin  . Recurrent pulmonary embolism ([name]) 05/03/2011   Pt has history of recurrent PE and is on chronic coumadin.  Pt has been complaining of dyspnea that she describes as chest tightness.  She has been to the ER multiple times for this.  Usually she gets CTA and serial CE.  She has had at least 6 CTA in a period of since 2011.  She also has an O2 requirement. It is unclear why pt feels dyspneic, complains of chest tightness, or has O2 requirement.  I thought that her complaint may be from deconditioning and referred her to PT.  Unfortunately Medicaid only pays for 4 PT visits.  Pt should be doing these exercises at home, but she has not.  I have referred her to [facility], and she has been seen by [name].  He did not understand the etiology of her dyspnea and O2 requirement.  He will continue to follow her.     . Seborrheic keratosis 01/18/2011  . Seizures ([name]) 02/12/2016  . Spasticity 01/05/2014  . Transient alteration of awareness, recurrent 09/08/2006   Recurrent episodes where Tiera  loses contact with her world and that have been studied thoroughly and appear nonepileptic in nature per Dr Terrace Arabia (neurology) The patient has had episodes starting in 2007 that initially began 1-4 times a day during which time she would have periods of unawareness followed by confusion. This continuous video EEG monitoring performed from October 8-12, 2007. Had   . Urethra dilated and patulous  07/03/2015   Patulous, Dilated Urethra. 5mL balloon on a 22-Fr catheter hoping this would prevent the bladder spasm from pushing the balloon down the urethra (Dr Logan Bores, Urology [facility], July 2016)    Hospitalizations: No., Head Injury: No., Nervous System Infections: No., Immunizations up to date: Yes.    Birth History Extreme prematurity  Behavior History Depression and anxiety  Surgical History Procedure  Laterality Date  . ABDOMINAL HYSTERECTOMY    . APPENDECTOMY    . BACLOFEN PUMP REFILL     x 3 times  . CARPAL TUNNEL RELEASE  08/2008   Dr Teressa Senter  . CESAREAN SECTION     x 2  . CHOLECYSTECTOMY  10/14/2015   Procedure: LAPAROSCOPIC CHOLECYSTECTOMY;  Surgeon: Violeta Gelinas, MD;  Location: [facility] OR;  Service: General;;  . COLPOSCOPY  06/2000  . INTRAUTERINE DEVICE INSERTION  04/30/11   Inserted by [facility] for endometriosis  . LAPAROSCOPIC ASSISTED VAGINAL HYSTERECTOMY  10/27/2012  . MULTIPLE EXTRACTIONS WITH ALVEOLOPLASTY Bilateral 07/19/2013   Procedure: EXTRACTIONS #4, 4,09,81,19;  Surgeon: Francene Finders, DDS;  Location: [facility] OR;  Service: Oral Surgery;  Laterality: Bilateral;  . NEUROMA SURGERY Left    anterior and posterior intersosseous  . PAIN PUMP IMPLANTATION N/A 03/07/2014   Procedure: baclofen pump revision/replacement and Catheter connection replacement;  Surgeon: Cristi Loron, MD;  Location: [facility] ORS;  Service: Neurosurgery;  Laterality: N/A;  baclofen pump revision/replacement and Catheter connection replacement  . PROGRAMABLE BACLOFEN PUMP REVISION  03/17/14   Battery Replacement   . TUBAL LIGATION  2003  . URETHRA SURGERY    . WRIST SURGERY  06/2010   Dr Dierdre Searles, hand surgeon, [name]   Family History family history includes Alzheimer's disease in her paternal grandfather; Asthma in her father; Breast cancer in her mother and paternal grandmother; Colon cancer in her maternal grandmother; Heart attack in her maternal grandfather. Family history is negative for  migraines, seizures, intellectual disabilities, blindness, deafness, birth defects, chromosomal disorder, or autism.  Social History Socioeconomic History  . Marital status: Divorced    Spouse name: Not on file  . Number of children: 2  . Years of education: college  . Highest education level:  Associates degree  Occupational History    Employer: UNEMPLOYED  Social Needs  . Financial resource strain: Not on file  . Food insecurity:    Worry: Not on file    Inability: Not on file  . Transportation needs:    Medical: Not on file    Non-medical: Not on file  Tobacco Use  . Smoking status: Never Smoker  . Smokeless tobacco: Never Used  Substance and Sexual Activity  . Alcohol use: Yes    Alcohol/week: 1.0 standard drinks    Types: 1 Glasses of wine per week    Comment: Drinks alcohol once a month.  . Drug use: No  . 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    She enjoys shopping.   Lives in a nursing facility[facility] since 02/11/[facility]      Ailene Ards, who has schizophrenia, MR, is father of daughter.  Homero Fellers and pt are no longer together.  Homero Fellers does not see [name]. Daughter is 44 yo, Levy Sjogren, lives in a group home in [place].    [name] [name]'s mother reportedly has breast cancer.    Needs assistance with ADL's and IADL's--has personal care services 20 hrs/wk;       No tobacco, EtOH, drugs.      **Case Manager: Jack Quarto [phone]      Patient lives [facility]   Allergies Allergen Reactions  . Morphine Dermatitis and Other (See Comments)    Skin turned red Other reaction(s): Other (See Comments) Skin turned red   Physical Exam BP (!) 76/40   LMP 04/05/2011   Pale,  tired, responsive but increased slurred speech because she is mumbling; fluid  from her suprapubic catheter is purulent  Assessment 1.  Cerebral palsy, quadriplegia,  G80.8. 2.  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 3.  Dystonia, G24.9.  Discussion Suspect urinary tract infection with possible urosepsis  Plan [name] [name] needs to be transferred by EMS to the emergency department at [facility].  She needs evaluation and possible admission and treatment including hydration and antibiotics.   Medication List   Accurate as of November 18, 2018  1:10 PM..    acetaminophen 650 MG CR tablet Commonly known as:  TYLENOL 8 HOUR Take 1 tablet (650 mg total) by mouth every 8 (eight) hours as needed.   amantadine 100 MG capsule Commonly known as:  SYMMETREL Take 100 mg by mouth daily.   baclofen 20 MG tablet Commonly known as:  LIORESAL Take 20 mg by mouth 4 (four) times daily. For bladder spasms   bisacodyl 10 MG suppository Commonly known as:  DULCOLAX Place 10 mg rectally 2 (two) times daily as needed for moderate constipation.   Buprenorphine HCl 450 MCG Film Commonly known as:  BELBUCA Place 450 mcg inside cheek every 12 (twelve) hours.   busPIRone 5 MG tablet Commonly known as:  BUSPAR Take 5 mg by mouth 3 (three) times daily.   butalbital-acetaminophen-caffeine 50-325-40 MG tablet Commonly known as:  FIORICET, ESGIC Take 2 tablets by mouth every 4 (four) hours as needed for headache (not controlled by Tylenol).   CERTA-VITE PO Take 1 tablet by mouth daily.   clonazePAM 0.5 MG tablet Commonly known as:  KLONOPIN Take 1.5 tablets (0.75 mg total) by mouth 3 (three) times daily.   feeding supplement (ENSURE ENLIVE) Liqd Take 237 mLs by mouth 2 (two) times daily between meals.   ferrous sulfate 325 (65 FE) MG tablet Take 325 mg by mouth daily.   FLUoxetine 40 MG capsule Commonly known as:  PROZAC Take 80 mg by mouth daily.   guaiFENesin 600 MG 12 hr tablet Commonly known as:  MUCINEX Take 600 mg by mouth 2 (two) times daily.   hydroxychloroquine 200 MG tablet Commonly known as:  PLAQUENIL Take 200 mg by mouth daily.   lactulose 10 GM/15ML  solution Commonly known as:  CHRONULAC Take 20 g by mouth daily as needed for mild constipation or moderate constipation.   lamoTRIgine 100 MG tablet Commonly known as:  LAMICTAL Take 100 mg by mouth at bedtime.   linaclotide 145 MCG Caps capsule Commonly known as:  LINZESS Take 145 mcg by mouth daily before breakfast.   lubiprostone 24 MCG capsule Commonly known as:  AMITIZA Take 1 capsule (24 mcg total) by mouth 2 (two) times daily with a meal.   meropenem 1 g in sodium chloride 0.9 % 100 mL Inject 1 g into the vein every 8 (eight) hours.   ondansetron 4 MG tablet Commonly known as:  ZOFRAN Take 1 tablet (4 mg total) by mouth every 6 (six) hours as needed for nausea.   oxybutynin 5 MG tablet Commonly known as:  DITROPAN Take 5 mg by mouth 4 (four) times daily.   oxyCODONE-acetaminophen 5-325 MG tablet Commonly known as:  PERCOCET/ROXICET Take 1 tablet by mouth every 4 (four) hours as needed for severe pain.   phenazopyridine 200 MG tablet Commonly known as:  PYRIDIUM Take 200 mg by mouth 3 (three) times daily as needed for pain.   polyethylene glycol powder powder Commonly known as:  GLYCOLAX/MIRALAX Take 1  1/2 dose  daily in 12 ounces of fluid every day.   QUEtiapine 50 MG tablet Commonly known as:  SEROQUEL Take 150 mg by mouth at bedtime.   QUEtiapine 100 MG tablet Commonly known as:  SEROQUEL Take 1 tablet (100 mg total) by mouth at bedtime.   rivaroxaban 10 MG Tabs tablet Commonly known as:  XARELTO Take 10 mg by mouth daily.   ROXICODONE 5 MG immediate release tablet Generic drug:  oxyCODONE Take 5 mg by mouth every 4 (four) hours as needed for moderate pain or severe pain.   tiZANidine 4 MG capsule Commonly known as:  ZANAFLEX Take 4 mg by mouth 2 (two) times daily.   topiramate 100 MG tablet Commonly known as:  TOPAMAX Take 1 tablet at bedtime    The medication list was reviewed and reconciled. All changes or newly prescribed medications were  explained.  A complete medication list was provided to the patient/caregiver.  Deetta Perla MD

## 2018-11-18 NOTE — ED Provider Notes (Signed)
[facility] EMERGENCY DEPARTMENT Provider Note   CSN: [id number] Arrival date & time: 11/18/18  1310     History   Chief Complaint Chief Complaint  Patient presents with  . Altered Mental Status    HPI [name] [name] is a 42 y.o. female with a past medical history of spastic cerebral palsy, implanted baclofen pump, OCD, VRE UTI, indwelling suprapubic catheter, PE status post IVC filter, who presents today for evaluation of generally not feeling well.  She went to her pediatric neurology where she is followed for her CP and was seen by the nurse practitioner.  She was noted to have a blood pressure of 70/40 and felt to be more lethargic than usual.  She was given oral fluids.  EMS had a blood pressure of 96/68.    Patient tells me that she has not been feeling well over the past few weeks.  She is excited because she is scheduled to get out of [facility] this month.  She denies any chest pain or abdominal pain.  No nausea vomiting or diarrhea.  She reports occasional cough however no shortness of breath.    HPI  Past Medical History:  Diagnosis Date  . Abdominal pain 01/02/2012   Overview:  Overview:  Per Previous pcp note- Dr. Ta--Pt has history of endometriosis and had DUB in 2011.  She She had u/s that showed normal endometrial stripe.  She had endometrial bx that was wnl.  She has a lot of abd cramping every month.  This cramping was sometimes not relieved with hydrocodone.  She was referred to [facility] for IUD placement to help endometriosis and abd cramping.  [name]  . Abdominal pain 01/02/2012   Overview:  Overview:  Per Previous pcp note- Dr. Ta--Pt has history of endometriosis and had DUB in 2011.  She She had u/s that showed normal endometrial stripe.  She had endometrial bx that was wnl.  She has a lot of abd cramping every month.  This cramping was sometimes not relieved with hydrocodone.  She was referred to [facility] for IUD placement to  help endometriosis and abd cramping.  [name] was placed 04/30/11 by [facility].  Pt still complains of abd cramping, which I am treating with hydrocodone and ultram. Pt reports some initial relief of pain from IUD, but states it has now returned back to similar to previous cramping. Gave patient a shot of Toradol 60 mg IM today-since positive exacerbation of abdominal cramping during past 2 weeks. Physical exam reassuring. Patient may need to return to [facility] for further workup since she is plugged in with the GYN providers there for further treatment options regarding endometriosis and abdominal cramping. The dysfunctional uterine bleeding now well contr  . Acute cystitis with hematuria   . Acute cystitis without hematuria   . Acute respiratory failure with hypoxia ([name])   . Anemia   . Cellulitis 12/22/2014  . Cerebral palsy ([name])    Spastic Cerebral palsy, mentally intact  . Contracture, joint, multiple sites    Electric wheelchair, uses left hand to operate chair.   . Depression   . Disease of female genital organs 10/24/2010   Overview:  Overview:  Pt has history of endometriosis and had DUB in 2011.  She had u/s that showed normal endometrial stripe.  She had endometrial bx that was wnl.  She has a lot of abd cramping every month.  This cramping was sometimes not relieved with hydrocodone.  She  was referred to [facility] for IUD placement to help endometriosis and abd cramping.  [name] was placed 04/30/11 by [facility].  Pt still complains of abd cramping, which was treated with hydrocodone and ultram.   Last seen at [facility] by OB/GYN 12/24/11, given Depo Lupron injection.  Ordering CT scan to r/o additional cause of abdominal pain (pt with known endometriosis and will not tolerate vaginal U/S, pain not improved s/p [name] or with Lupron injection-- only caused hot flashes and amenorrhea, no pain relief).  Also made referral to pain clinic for better pain control.  Do not think hysterectomy would help  with pain, and concern for contamination of baclofen pump if did proceed with surgery.    8/5/13[name] OB/GYN: Pt was seen by   . [name]   . Dysarthria   . Dyspnea and respiratory abnormality 02/04/2011   Overview:  Overview:  Pt has history of recurrent PE and is on chronic coumadin.  Pt has been complaining of dyspnea that she describes as chest tightness.  She has been to the ER multiple times for this.  Usually she gets CTA and serial CE.  She has had at least 6 CTA in a period of since 2011.  She also has an O2 requirement. It is unclear why pt feels dyspneic, complains of chest tightness, or has O2 requirement.  I thought that her complaint may be from deconditioning and referred her to PT.  Unfortunately Medicaid only pays for 4 PT visits.  Pt should be doing these exercises at home, but she has not.  I have referred her to [facility], and she has been seen by [name].  He did not understand the etiology of her dyspnea and O2 requirement.  He will continue to follow her.   Last Assessment & Plan:  Pt presents with concerns for chest tightness and dyspnea.  She was seen in ED yesterday and discharged home.  While there she had a CT chest 04/20/11 showed 1.  Multifocal degradation as detailed above.  N  . Endometriosis   . GERD (gastroesophageal reflux disease)   . Gram positive sepsis ([name]) 10/24/2014  . Gross hematuria 03/12/2013   Overview:  Overview:  status post replacement of suprapubic tube 03/04/2013  Last Assessment & Plan:  Recent urine culture collected in ED on 5/4 did not result in significant growth. Moreover, patient has been asymptomatic with normal WBC, no fever and no true symptoms making symptomatic UTI less likely.  Will continue to monitor for any sign of infection.  Patient's xarelto had been held during her recent hospitalization due to hematuria. This has since been re-started. Monitor bleeding.    [name] Kitchen HCAP (healthcare-associated pneumonia) 10/25/2014  .  HCAP (healthcare-associated pneumonia) 11/24/2017  . History of anticoagulant therapy 10/21/2012  . History of endometriosis 10/2012   OBGYN [facility]: Laparoscopic Endometrial Ablation, TVH,  . History of recurrent UTIs   . Hypertension   . HYPERTENSION, BENIGN 01/31/2010   Qualifier: Diagnosis of  By: [name], MD, [name] Incontinent of feces 03/11/2016  . Infantile cerebral palsy ([name]) 01/29/2007   Overview:  She has spastic CP.  She is mentally intact.  She is wheelchair bound.  She is wheelchair bound for ambulation      . Intracervical pessary 05/07/2011   Overview:  Overview:  Placed by [facility] GYN 04/30/11 to treat DUB and Endometriosis.  She is seen at GYN clinic at [facility] but was considered a poor  surgical candidate and referred her to [facility].  For DUB she had pelvic ultrasound that showed thin stripe and she had endometrial Bx by Dr Jennette Kettle in GYN clinic, which was negative.     . Macroscopic hematuria 03/10/2013   status post replacement of suprapubic tube 03/04/2013   . Migraine   . OCD (obsessive compulsive disorder)   . Parapneumonic effusion 10/25/2014  . Presence of intrathecal baclofen pump 03/07/2014   R LQ. Insertion T10.    [name] Kitchen Psychiatric illness 10/31/2014  . Pulmonary embolism ([name])    Lifetime Coumadin  . Pulmonary embolism ([name]) 2011, 01/2011   Will be on lifetime coumadin  . Recurrent pulmonary embolism ([name]) 05/03/2011   Pt has history of recurrent PE and is on chronic coumadin.  Pt has been complaining of dyspnea that she describes as chest tightness.  She has been to the ER multiple times for this.  Usually she gets CTA and serial CE.  She has had at least 6 CTA in a period of since 2011.  She also has an O2 requirement. It is unclear why pt feels dyspneic, complains of chest tightness, or has O2 requirement.  I thought that her complaint may be from deconditioning and referred her to PT.  Unfortunately Medicaid only pays for 4 PT visits.  Pt should be doing these  exercises at home, but she has not.  I have referred her to [facility], and she has been seen by [name].  He did not understand the etiology of her dyspnea and O2 requirement.  He will continue to follow her.     . Seborrheic keratosis 01/18/2011  . Seizures ([name]) 02/12/2016  . Spasticity 01/05/2014  . Transient alteration of awareness, recurrent 09/08/2006   Recurrent episodes where Del[place]  loses contact with her world and that have been studied thoroughly and appear nonepileptic in nature per Dr Terrace Arabia (neurology) The patient has had episodes starting in 2007 that initially began 1-4 times a day during which time she would have periods of unawareness followed by confusion. This continuous video EEG monitoring performed from October 8-12, 2007. Had   . Urethra dilated and patulous 07/03/2015   Patulous, Dilated Urethra. 5mL balloon on a 22-Fr catheter hoping this would prevent the bladder spasm from pushing the balloon down the urethra (Dr Logan Bores, Urology [facility], July 2016)   Chronic anticoagulation  Patient Active Problem List   Diagnosis Date Noted  . Solitary pulmonary nodule on lung CT 10/28/2018  . Connective tissue disorder ([name]) 08/07/2018  . Migraine without aura and without status migrainosus, not intractable 06/26/2018  . UTI (urinary tract infection) 05/25/2018  . Headache 05/25/2018  . Incontinent of feces 03/11/2016  . Chronic anticoagulation 03/11/2016  . Involuntary movements 02/22/2016  . Seizures ([name]) 02/12/2016  . Dyspnea 02/12/2016  . Cerebral palsy, quadriplegic ([name]) 02/06/2016  . Swelling of extremity 01/13/2016  . Fever   . Pressure ulcer 10/09/2015  . Status post insertion of inferior vena caval filter 09/28/2015  . Absence of bladder continence 08/25/2015  . Person living in residential institution 06/14/2015  . Pulmonary hypertension ([name])   . Anemia of chronic disease   . Spasticity 03/15/2015  . Constipation, chronic 10/10/2014  . Athetoid cerebral palsy ([name])  04/13/2014  . Dystonia 04/12/2014  . UTI (lower urinary tract infection) 03/16/2014  . Presence of intrathecal baclofen pump 03/07/2014  . Dental caries 07/18/2013  . Multiple thyroid nodules 05/20/2013  . Osteoarthrosis 04/07/2013  . [name]   .  Dysarthria   . GERD (gastroesophageal reflux disease)   . Neurogenic bladder 02/05/2013  . Anxiety state 10/21/2012  . Chronic pain syndrome 06/17/2012  . Recurrent pulmonary embolism ([name]) 05/03/2011  . Pulmonary embolism with infarction ([name]) 05/03/2011  . Chronic suprapubic catheter ([name]) 03/15/2011  . Disease of female genital organs 10/24/2010  . HYPERTENSION, BENIGN 01/31/2010  . CP (cerebral palsy), spastic ([name]) 12/27/2008  . Contracted, joint, multiple sites 12/27/2008  . Major depressive disorder, recurrent episode ([name]) 01/29/2007  . Obsessive Compulsive Disorder 01/29/2007  . Transient alteration of awareness, recurrent 09/08/2006    Past Surgical History:  Procedure Laterality Date  . ABDOMINAL HYSTERECTOMY    . APPENDECTOMY    . BACLOFEN PUMP REFILL     x 3 times  . CARPAL TUNNEL RELEASE  08/2008   Dr Teressa Senter  . CESAREAN SECTION     x 2  . CHOLECYSTECTOMY  10/14/2015   Procedure: LAPAROSCOPIC CHOLECYSTECTOMY;  Surgeon: Violeta Gelinas, MD;  Location: [facility] OR;  Service: General;;  . COLPOSCOPY  06/2000  . INTRAUTERINE DEVICE INSERTION  04/30/11   Inserted by [facility] GYN for endometriosis  . LAPAROSCOPIC ASSISTED VAGINAL HYSTERECTOMY  10/27/2012  . MULTIPLE EXTRACTIONS WITH ALVEOLOPLASTY Bilateral 07/19/2013   Procedure: EXTRACTIONS #4, 1,61,09,60;  Surgeon: Francene Finders, DDS;  Location: [facility] OR;  Service: Oral Surgery;  Laterality: Bilateral;  . NEUROMA SURGERY Left    anterior and posterior intersosseous  . PAIN PUMP IMPLANTATION N/A 03/07/2014   Procedure: baclofen pump revision/replacement and Catheter connection replacement;  Surgeon: Cristi Loron, MD;  Location: [facility] ORS;   Service: Neurosurgery;  Laterality: N/A;  baclofen pump revision/replacement and Catheter connection replacement  . PROGRAMABLE BACLOFEN PUMP REVISION  03/17/14   Battery Replacement   . TUBAL LIGATION  2003  . URETHRA SURGERY    . WRIST SURGERY  06/2010   Dr Dierdre Searles, hand surgeon, [name]     OB History   No obstetric history on file.      Home Medications    Prior to Admission medications   Medication Sig Start Date End Date Taking? Authorizing Provider  acetaminophen (TYLENOL 8 HOUR) 650 MG CR tablet Take 1 tablet (650 mg total) by mouth every 8 (eight) hours as needed. Patient taking differently: Take 650 mg by mouth every 8 (eight) hours as needed for pain.  07/08/18   Derwood Kaplan, MD  amantadine (SYMMETREL) 100 MG capsule Take 100 mg by mouth daily.     [provider]  baclofen (LIORESAL) 20 MG tablet Take 20 mg by mouth 4 (four) times daily. For bladder spasms    [provider]  bisacodyl (DULCOLAX) 10 MG suppository Place 10 mg rectally 2 (two) times daily as needed for moderate constipation.     [provider]  Buprenorphine HCl (BELBUCA) 450 MCG FILM Place 450 mcg inside cheek every 12 (twelve) hours. 08/22/18   Amin, Loura Halt, MD  busPIRone (BUSPAR) 5 MG tablet Take 5 mg by mouth 3 (three) times daily.    [provider]  butalbital-acetaminophen-caffeine (FIORICET, ESGIC) 50-325-40 MG tablet Take 2 tablets by mouth every 4 (four) hours as needed for headache (not controlled by Tylenol). 05/29/18   Darlin Drop, DO  clonazePAM (KLONOPIN) 0.5 MG tablet Take 1.5 tablets (0.75 mg total) by mouth 3 (three) times daily. 11/27/17   Narda Bonds, MD  feeding supplement, ENSURE ENLIVE, (ENSURE ENLIVE) LIQD Take 237 mLs by mouth 2 (two) times daily between meals. 05/30/18  Dow AdolphHall, Carole N, DO  ferrous sulfate 325 (65 FE) MG tablet Take 325 mg by mouth daily.     [provider]  FLUoxetine (PROZAC) 40 MG capsule Take 80 mg by mouth  daily.    [provider]  guaiFENesin (MUCINEX) 600 MG 12 hr tablet Take 600 mg by mouth 2 (two) times daily.    [provider]  hydroxychloroquine (PLAQUENIL) 200 MG tablet Take 200 mg by mouth daily.    [provider]  lactulose (CHRONULAC) 10 GM/15ML solution Take 20 g by mouth daily as needed for mild constipation or moderate constipation.    [provider]  lamoTRIgine (LAMICTAL) 100 MG tablet Take 100 mg by mouth at bedtime.    [provider]  linaclotide (LINZESS) 145 MCG CAPS capsule Take 145 mcg by mouth daily before breakfast.    [provider]  lubiprostone (AMITIZA) 24 MCG capsule Take 1 capsule (24 mcg total) by mouth 2 (two) times daily with a meal. 01/01/17   Esterwood, [name] S, PA-C  meropenem 1 g in sodium chloride 0.9 % 100 mL Inject 1 g into the vein every 8 (eight) hours. 08/21/18   Amin, Loura HaltAnkit Chirag, MD  Multiple Vitamins-Minerals (CERTA-VITE PO) Take 1 tablet by mouth daily.    [provider]  oxybutynin (DITROPAN) 5 MG tablet Take 5 mg by mouth 4 (four) times daily.  02/20/16   [provider]  oxyCODONE (ROXICODONE) 5 MG immediate release tablet Take 5 mg by mouth every 4 (four) hours as needed for moderate pain or severe pain.    [provider]  oxyCODONE-acetaminophen (PERCOCET/ROXICET) 5-325 MG tablet Take 1 tablet by mouth every 8 (eight) hours as needed for severe pain.    [provider]  phenazopyridine (PYRIDIUM) 200 MG tablet Take 200 mg by mouth 3 (three) times daily as needed for pain.    [provider]  polyethylene glycol powder (GLYCOLAX/MIRALAX) powder Take 1  1/2 dose  daily in 12 ounces of fluid every day. Patient taking differently: Take 22.5 g by mouth daily.  01/01/17   Esterwood, [name] S, PA-C  QUEtiapine (SEROQUEL) 50 MG tablet Take 150 mg by mouth at bedtime.    [provider]  rivaroxaban (XARELTO) 10 MG TABS tablet Take 10 mg by mouth daily.     [provider]  tiZANidine (ZANAFLEX) 4 MG capsule Take 4 mg by mouth 2 (two) times daily.     [provider]  topiramate (TOPAMAX) 100 MG tablet Take 1 tablet at bedtime 09/17/18   Elveria RisingGoodpasture, Tina, NP    Family History Family History  Problem Relation Age of Onset  . Asthma Father   . Colon cancer Maternal Grandmother        Died in her 2860's  . Breast cancer Paternal Grandmother        Died in her 2370's  . Heart attack Maternal Grandfather        Died in his 3270's  . Alzheimer's disease Paternal Grandfather        Died in his 7080's  . Breast cancer Mother   . Stomach cancer Neg Hx     Social History Social History   Tobacco Use  . Smoking status: Never Smoker  . Smokeless tobacco: Never Used  Substance Use Topics  . Alcohol use: Yes    Alcohol/week: 1.0 standard drinks    Types: 1 Glasses of wine per week    Comment: Drinks alcohol once a month.  .[name] Kitchen  Drug use: No     Allergies   Morphine   Review of Systems Review of Systems  Constitutional: Negative for fatigue and fever.       Generally not feeling well  HENT: Negative for congestion.   Respiratory: Negative for chest tightness and shortness of breath.   Cardiovascular: Negative for chest pain.  Gastrointestinal: Negative for abdominal pain, diarrhea, nausea and vomiting.  Genitourinary:       Catheter in place (suprapubic)     Physical Exam Updated Vital Signs BP (!) 83/58   Pulse 67   Temp 98.2 F (36.8 C) (Oral)   Resp 14   LMP 04/05/2011   SpO2 96%   Physical Exam Vitals signs and nursing note reviewed.  Constitutional:      Appearance: She is well-developed. She is ill-appearing.     Comments: Chronically ill-appearing, spasticity in arms and legs  HENT:     Head: Normocephalic and atraumatic.     Mouth/Throat:     Mouth: Mucous membranes are dry.     Pharynx: No oropharyngeal exudate.     Comments: Teeth have a thick layer of dried material on them Eyes:      Conjunctiva/sclera: Conjunctivae normal.  Neck:     Musculoskeletal: Normal range of motion and neck supple. No neck rigidity.  Cardiovascular:     Rate and Rhythm: Normal rate and regular rhythm.     Pulses: Normal pulses.     Heart sounds: No murmur.  Pulmonary:     Effort: Pulmonary effort is normal. No respiratory distress.     Breath sounds: Normal breath sounds.  Abdominal:     General: Abdomen is flat. Bowel sounds are normal. There is no distension.     Palpations: Abdomen is soft. There is no mass.     Tenderness: There is no abdominal tenderness.     Comments: Suprapubic catheter with frank purulent material in the catheter.   Skin:    General: Skin is warm and dry.  Neurological:     Mental Status: She is alert and oriented to person, place, and time.     GCS: GCS eye subscore is 4. GCS verbal subscore is 5. GCS motor subscore is 6.     Motor: Abnormal muscle tone (Increased tone/spasticity in bilateral arms and legs. ) present.      ED Treatments / Results  Labs (all labs ordered are listed, but only abnormal results are displayed) Labs Reviewed  COMPREHENSIVE METABOLIC PANEL - Abnormal; Notable for the following components:      Result Value   Calcium 8.7 (*)    All other components within normal limits  CBC WITH DIFFERENTIAL/PLATELET - Abnormal; Notable for the following components:   RBC 3.76 (*)    Hemoglobin 11.5 (*)    Eosinophils Absolute 0.7 (*)    All other components within normal limits  URINALYSIS, ROUTINE W REFLEX MICROSCOPIC - Abnormal; Notable for the following components:   Color, Urine AMBER (*)    APPearance CLOUDY (*)    Hgb urine dipstick SMALL (*)    Nitrite POSITIVE (*)    Leukocytes, UA LARGE (*)    Bacteria, UA MANY (*)    All other components within normal limits  CULTURE, BLOOD (ROUTINE X 2)  CULTURE, BLOOD (ROUTINE X 2)  URINE CULTURE  I-STAT CG4 LACTIC ACID, ED  I-STAT CG4 LACTIC ACID, ED    EKG EKG  Interpretation  Date/Time:  Wednesday November 18 2018 13:46:47 EST Ventricular Rate:  70 PR Interval:    QRS Duration: 118 QT Interval:  421 QTC Calculation: 455 R Axis:   10 Text Interpretation:  Sinus rhythm Nonspecific intraventricular conduction delay Borderline T abnormalities, inferior leads Confirmed by Kennis Carina [phone]) on 11/18/2018 2:02:16 PM   Radiology Dg Chest Port 1 View  Result Date: 11/18/2018 CLINICAL DATA:  Sepsis EXAM: PORTABLE CHEST 1 VIEW COMPARISON:  08/30/2018 FINDINGS: There is no focal parenchymal opacity. There is no pleural effusion or pneumothorax. The heart and mediastinal contours are unremarkable. There is anterior and posterior cervicothoracic fusion. There is no acute osseous abnormality. There is gaseous distension of the colon. IMPRESSION: No acute cardiopulmonary disease. Electronically Signed   By: Elige Ko   On: 11/18/2018 14:25    Procedures Procedures (including critical care time)  Medications Ordered in ED Medications  sodium chloride 0.9 % bolus 1,000 mL (1,000 mLs Intravenous New Bag/Given 11/18/18 1357)    And  sodium chloride 0.9 % bolus 500 mL (has no administration in time range)  meropenem (MERREM) 1 g in sodium chloride 0.9 % 100 mL IVPB (has no administration in time range)     Initial Impression / Assessment and Plan / ED Course  I have reviewed the triage vital signs and the nursing notes.  Pertinent labs & imaging results that were available during my care of the patient were reviewed by me and considered in my medical decision making (see chart for details).  Clinical Course as of Nov 18 1618  Wed Nov 18, 2018  1353 Spoke with pharmacist mike, he recommends 1 g meropenem every 12 hours.   [EH]  1355 Spoke with Dr. Ophelia Charter who agreed to admit patient.    [EH]  1454 Many bacteria, 21-50 leukocytes   Nitrite(!): POSITIVE [EH]  1458 Sepsis re-evaluation completed.    [EH]    Clinical Course User Index [EH]  Cristina Gong, PA-C   Patient presents today from her neurology office for evaluation of hypotension and being more lethargic than normal.  Patient tells me that she has generally not been feeling well for the past few weeks.  On initial exam her indwelling suprapubic catheter had frank pus in the tube.  Suspected code sepsis was called based on hypotension with obvious infectious source.  Patient was given 30/kg fluid bolus.  Her white count was not elevated, she was afebrile, not tachycardic.  Blood cultures were obtained.  I spoke with Kathlene November, pharmacist about antibiotic choice given her history of VRE, recommended 1g meropenem q12h.  Urine culture obtained.  Her lactic was not elevated.  This patient was seen as a shared visit with Dr. Pilar Plate.    I spoke with Dr. Ophelia Charter who agreed to admit the patient.   Final Clinical Impressions(s) / ED Diagnoses   Final diagnoses:  Lower urinary tract infectious disease  Hypotension, unspecified hypotension type    ED Discharge Orders    None       Cristina Gong, [place] 11/18/18 1624    Sabas Sous, MD 11/18/18 [phone]

## 2018-11-18 NOTE — ED Notes (Signed)
Spoke with Kami, Geophysicist/field seismologistN Charge about hx. Of blood pressure with us in ED and in past. Charge approved bed.

## 2018-11-18 NOTE — ED Triage Notes (Signed)
[facility]. From neuro office and NP said more lethargic that usual. BP there 70/40. Gave oral fluids. EMS BP is 96/68. She does talk and is interactive.

## 2018-11-18 NOTE — ED Notes (Signed)
[name] CharterYates, MD paged about manual BP of 86/60. MD said she believes this is close to her baseline and our goal is a MAP above 65 and was told to monitor pt.

## 2018-11-18 NOTE — Patient Instructions (Signed)
I am excited that you are getting ready to move out of the chronic care facility.  I hope that this works out for you.  Please keep in touch with me.  We can adjust your pump as needed plan is to see you on or about Apr 08, 2019 sooner if we need to.

## 2018-11-18 NOTE — ED Notes (Signed)
Pt not happy with the dinner tray. This tech ordered new dinner

## 2018-11-19 DIAGNOSIS — I959 Hypotension, unspecified: Secondary | ICD-10-CM | POA: Diagnosis present

## 2018-11-19 DIAGNOSIS — N390 Urinary tract infection, site not specified: Secondary | ICD-10-CM | POA: Diagnosis not present

## 2018-11-19 DIAGNOSIS — R279 Unspecified lack of coordination: Secondary | ICD-10-CM | POA: Diagnosis not present

## 2018-11-19 DIAGNOSIS — I10 Essential (primary) hypertension: Secondary | ICD-10-CM | POA: Diagnosis not present

## 2018-11-19 DIAGNOSIS — R52 Pain, unspecified: Secondary | ICD-10-CM | POA: Diagnosis not present

## 2018-11-19 DIAGNOSIS — Z743 Need for continuous supervision: Secondary | ICD-10-CM | POA: Diagnosis not present

## 2018-11-19 LAB — BASIC METABOLIC PANEL
Anion gap: 9 (ref 5–15)
BUN: 9 mg/dL (ref 6–20)
CO2: 20 mmol/L — ABNORMAL LOW (ref 22–32)
Calcium: 8.3 mg/dL — ABNORMAL LOW (ref 8.9–10.3)
Chloride: 111 mmol/L (ref 98–111)
Creatinine, Ser: 0.61 mg/dL (ref 0.44–1.00)
GFR calc Af Amer: >60 mL/min (ref >60)
GFR calc non Af Amer: >60 mL/min (ref >60)
Glucose, Bld: 78 mg/dL (ref 70–99)
Potassium: 3.9 mmol/L (ref 3.5–5.1)
Sodium: 140 mmol/L (ref 135–145)

## 2018-11-19 LAB — CBC
HCT: 35.6 % — ABNORMAL LOW (ref 36.0–46.0)
Hemoglobin: 11.4 g/dL — ABNORMAL LOW (ref 12.0–15.0)
MCH: 30.6 pg (ref 26.0–34.0)
MCHC: 32 g/dL (ref 30.0–36.0)
MCV: 95.4 fL (ref 80.0–100.0)
NRBC: 0 % (ref 0.0–0.2)
Platelets: 173 10*3/uL (ref 150–400)
RBC: 3.73 MIL/uL — ABNORMAL LOW (ref 3.87–5.11)
RDW: 13.9 % (ref 11.5–15.5)
WBC: 4.8 10*3/uL (ref 4.0–10.5)

## 2018-11-19 LAB — MRSA PCR SCREENING: MRSA by PCR: POSITIVE — AB

## 2018-11-19 MED ORDER — LAMOTRIGINE 100 MG PO TABS
100.0000 mg | ORAL_TABLET | Freq: Every day | ORAL | Status: DC
  Filled 2018-11-19: qty 1

## 2018-11-19 MED ORDER — AMANTADINE HCL 100 MG PO CAPS
100.0000 mg | ORAL_CAPSULE | Freq: Every day | ORAL | Status: DC
  Administered 2018-11-19: 100 mg via ORAL
  Filled 2018-11-19: qty 1

## 2018-11-19 MED ORDER — QUETIAPINE FUMARATE 50 MG PO TABS: 150.0000 mg | ORAL_TABLET | Freq: Every day | ORAL | Status: DC

## 2018-11-19 MED ORDER — OXYBUTYNIN CHLORIDE 5 MG PO TABS
5.0000 mg | ORAL_TABLET | Freq: Four times a day (QID) | ORAL | Status: DC
  Administered 2018-11-19 (×3): 5 mg via ORAL
  Filled 2018-11-19 (×3): qty 1

## 2018-11-19 MED ORDER — RIVAROXABAN 10 MG PO TABS
10.0000 mg | ORAL_TABLET | Freq: Every day | ORAL | Status: DC
  Administered 2018-11-19: 10 mg via ORAL
  Filled 2018-11-19: qty 1

## 2018-11-19 MED ORDER — QUETIAPINE FUMARATE 100 MG PO TABS: 100.0000 mg | ORAL_TABLET | Freq: Every day | ORAL | 1 refills | Status: DC

## 2018-11-19 MED ORDER — OXYCODONE-ACETAMINOPHEN 5-325 MG PO TABS: 1.0000 | ORAL_TABLET | ORAL | Status: DC | PRN

## 2018-11-19 MED ORDER — CLONAZEPAM 0.25 MG PO TBDP
0.7500 mg | ORAL_TABLET | Freq: Three times a day (TID) | ORAL | Status: DC
  Administered 2018-11-19: 0.75 mg via ORAL
  Filled 2018-11-19: qty 3

## 2018-11-19 MED ORDER — OXYCODONE HCL 5 MG PO TABS
10.0000 mg | ORAL_TABLET | Freq: Four times a day (QID) | ORAL | Status: DC
  Administered 2018-11-19 (×3): 10 mg via ORAL
  Filled 2018-11-19 (×3): qty 2

## 2018-11-19 MED ORDER — ONDANSETRON HCL 4 MG PO TABS: 4.0000 mg | ORAL_TABLET | Freq: Four times a day (QID) | ORAL | 0 refills | Status: DC | PRN

## 2018-11-19 MED ORDER — LINACLOTIDE 145 MCG PO CAPS: 145.0000 ug | ORAL_CAPSULE | Freq: Every day | ORAL | Status: DC

## 2018-11-19 MED ORDER — SODIUM CHLORIDE 0.9 % IV SOLN
INTRAVENOUS | Status: DC
  Administered 2018-11-19: 11:00:00 via INTRAVENOUS

## 2018-11-19 MED ORDER — HYDROXYCHLOROQUINE SULFATE 200 MG PO TABS
200.0000 mg | ORAL_TABLET | Freq: Every day | ORAL | Status: DC
  Administered 2018-11-19: 200 mg via ORAL
  Filled 2018-11-19: qty 1

## 2018-11-19 MED ORDER — BUPRENORPHINE HCL 450 MCG BU FILM: 450.0000 ug | ORAL_FILM | Freq: Two times a day (BID) | BUCCAL | Status: DC

## 2018-11-19 MED ORDER — FLUOXETINE HCL 20 MG PO CAPS
80.0000 mg | ORAL_CAPSULE | Freq: Every day | ORAL | Status: DC
  Administered 2018-11-19: 80 mg via ORAL
  Filled 2018-11-19: qty 4

## 2018-11-19 MED ORDER — BISACODYL 10 MG RE SUPP: 10.0000 mg | Freq: Two times a day (BID) | RECTAL | Status: DC | PRN

## 2018-11-19 MED ORDER — POLYETHYLENE GLYCOL 3350 17 G PO PACK
17.0000 g | PACK | Freq: Every day | ORAL | Status: DC
  Administered 2018-11-19: 17 g via ORAL
  Filled 2018-11-19: qty 1

## 2018-11-19 MED ORDER — TOPIRAMATE 100 MG PO TABS: 100.0000 mg | ORAL_TABLET | Freq: Every day | ORAL | Status: DC

## 2018-11-19 MED ORDER — BACLOFEN 10 MG PO TABS
20.0000 mg | ORAL_TABLET | Freq: Four times a day (QID) | ORAL | Status: DC
  Administered 2018-11-19 (×2): 20 mg via ORAL
  Filled 2018-11-19 (×2): qty 2

## 2018-11-19 MED ORDER — POLYETHYLENE GLYCOL 3350 17 GM/SCOOP PO POWD
22.5000 g | Freq: Every day | ORAL | Status: DC
  Filled 2018-11-19: qty 255

## 2018-11-19 MED ORDER — OXYCODONE HCL 5 MG PO TABS: 5.0000 mg | ORAL_TABLET | ORAL | Status: DC | PRN

## 2018-11-19 MED ORDER — MUPIROCIN 2 % EX OINT
TOPICAL_OINTMENT | Freq: Two times a day (BID) | CUTANEOUS | Status: DC
  Administered 2018-11-19: 14:00:00 via NASAL
  Filled 2018-11-19: qty 22

## 2018-11-19 MED ORDER — FOSFOMYCIN TROMETHAMINE 3 G PO PACK
3.0000 g | PACK | Freq: Once | ORAL | Status: AC
  Administered 2018-11-19: 3 g via ORAL
  Filled 2018-11-19: qty 3

## 2018-11-19 MED ORDER — CLONAZEPAM 0.5 MG PO TABS: 0.7500 mg | ORAL_TABLET | Freq: Three times a day (TID) | ORAL | Status: DC

## 2018-11-19 MED ORDER — TIZANIDINE HCL 4 MG PO TABS
4.0000 mg | ORAL_TABLET | Freq: Two times a day (BID) | ORAL | Status: DC
  Administered 2018-11-19: 4 mg via ORAL
  Filled 2018-11-19: qty 1
  Filled 2018-11-19: qty 2
  Filled 2018-11-19: qty 1

## 2018-11-19 MED ORDER — MELATONIN 3 MG PO TABS
5.0000 mg | ORAL_TABLET | Freq: Every day | ORAL | Status: DC
  Filled 2018-11-19: qty 1.5

## 2018-11-19 MED ORDER — BUSPIRONE HCL 5 MG PO TABS
5.0000 mg | ORAL_TABLET | Freq: Three times a day (TID) | ORAL | Status: DC
  Administered 2018-11-19 (×2): 5 mg via ORAL
  Filled 2018-11-19 (×2): qty 1

## 2018-11-19 MED ORDER — AZELASTINE HCL 0.1 % NA SOLN
2.0000 | Freq: Two times a day (BID) | NASAL | Status: DC
  Administered 2018-11-19: 2 via NASAL
  Filled 2018-11-19: qty 30

## 2018-11-19 MED ORDER — GUAIFENESIN ER 600 MG PO TB12
600.0000 mg | ORAL_TABLET | Freq: Two times a day (BID) | ORAL | Status: DC
  Administered 2018-11-19: 600 mg via ORAL
  Filled 2018-11-19: qty 1

## 2018-11-19 MED ORDER — FERROUS SULFATE 325 (65 FE) MG PO TABS: 325.0000 mg | ORAL_TABLET | Freq: Every day | ORAL | Status: DC

## 2018-11-19 NOTE — Clinical Social Work Note (Addendum)
[name] is medically stable for discharge and will be returning to [facility] facility, transported by ambulance. Facility admissions director notified and discharge clinicals transmitted to facility. Mr. [name] [name] contacted and advised of discharge. He asked that CSW advised facility that patient's electric wheelchair is at her doctor's office and she will need a manual wheelchair until he can get her wheelchair to the facility. Admissions director contacted (4:29 pm) and detailed message left. CSW signing off as no other SW intervention services needed.  Genelle BalVanessa Kaiser [name], MSW, LCSW Licensed Clinical Social Worker Clinical Social Work Department [facility] [phone]

## 2018-11-19 NOTE — Discharge Summary (Addendum)
Physician Discharge Summary  [name] [name] WUJ:[id number] DOB: 1976-02-05 DOA: 11/18/2018  PCP: Justin Mend, MD  Admit date: 11/18/2018 Discharge date: 11/19/2018  Admitted From: Nursing home Disposition: Nursing home Recommendations for Outpatient Follow-up:  1. Follow up with PCP in 1-2 weeks 2. Please obtain BMP/CBC in one week  Home Health none Equipment/Devices: None  Discharge Condition stable CODE STATUS full code Diet recommendation: Cardiac Brief/Interim Summary:42 y.o. female with medical history significant of cerebral palsy with contractures, spasticity (implanted baclofen pump), and recurrent UTIs (including VRE) with suprapubic catheter placement; HTN; and recurrent PE with IVC filter and on lifelong Coumadin presenting with AMS.  She is extremely difficult to understand.   She was sent by the pediatric neurology office for evaluation after BP was 70/40 and she appeared lethargic.  She is at a facility ([facility]) and is due to be discharged Monday; she will be living with an aide, according to her description.  ED Course:  Sent by neurologist for hypotension and confusion.  BP 70/40 in their office.  Has suprapubic cath and pus in catheter tube.  H/o VRE UTI - pharmacy recommends Meropenem q12h.  Sepsis protocol initiated but not clearly with sepsis.  Still with hypotension.  Given 30 cc/kg bolus.  BP 90/50-60s.    She has chronic hypotension.   Discharge Diagnoses:  Principal Problem:   Complicated UTI (urinary tract infection) Active Problems:   Chronic suprapubic catheter ([name])   Recurrent pulmonary embolism ([name])   Chronic pain syndrome   Cerebral palsy, quadriplegic ([name])   Seizures ([name])   Chronic anticoagulation   Hypotension  Complicated UTI with suprapubic catheter in place -Patient with chronic suprapubic catheter presenting with hypotension and decreased mentation -She has chronically low BP in 100/60 range but was lower  than usual at the doctor's office.  She was admitted started on IV fluids and meropenem.  Final urine culture is pending at this time.  She will be given fosfomycin today and will be discharged to the nursing home/skilled nursing facility today.  Will follow up on the urine culture. -BP has improved with hydration, no other SIRS criteria to raise concern for sepsis.  I have decreased her dose of Seroquel 100 mg nightly from 150 mg nightly.  This was done due to low blood pressure. -Prior UCx:              09/08/18 - Proteus sensitive to Cefazolin, Ceftriaxone, Gent, Imipenem, and Zosyn             09/01/18 - VRE sensitive to Amp, Linezolid, and Nitrofurantoin             01/25/18 - E coli, ESBL, sensitive to [place], Imipenem, and Zosyn             11/29/17 - Proteus resistant only to Nitrofurantoin -Based on these results, pharmacy has recommended coverage with Meropenem - Cerebral palsy -She is severely physically affected, although notes report that she is cognitively intact -Continue Baclofen by pump and PO as well as Zanaflex -She takes several psychiatric medications, including Buspar, Klonopin, Seroquel, and Prozac.  Continue.  Recurrent PE, on AC -On Plaquenil -Continue Xarelto  Chronic pain -Buprenorphine, Oxy IR, Oxycodone, and Percocet -She has significant constipation as a result of all of these opiates and takes many GI medications -It may be beneficial to attempt to wean some of these medications, if possible  Seizures -Continue Lamictal and Topamax  RN Pressure Injury Documentation: Pressure Ulcer 03/14/14  Stage I -  Intact skin with non-blanchable redness of a localized area usually over a bony prominence. buttock redden  (Active)  03/14/14 2200  Location: Buttocks  Location Orientation: Mid  Staging: Stage I -  Intact skin with non-blanchable redness of a localized area usually over a bony prominence.  Wound Description (Comments): buttock redden   Present on  Admission:      Pressure Ulcer 10/22/14 Stage II -  Partial thickness loss of dermis presenting as a shallow open ulcer with a red, pink wound bed without slough. almost appears as a skin tear, no drainage (Active)  10/22/14 1728  Location: Coccyx  Location Orientation: Mid  Staging: Stage II -  Partial thickness loss of dermis presenting as a shallow open ulcer with a red, pink wound bed without slough.  Wound Description (Comments): almost appears as a skin tear, no drainage  Present on Admission: Yes     Pressure Ulcer 10/29/15 Stage II -  Partial thickness loss of dermis presenting as a shallow open ulcer with a red, pink wound bed without slough. (Active)  10/29/15 0915  Location: Buttocks  Location Orientation: Left  Staging: Stage II -  Partial thickness loss of dermis presenting as a shallow open ulcer with a red, pink wound bed without slough.  Wound Description (Comments):   Present on Admission:        Estimated body mass index is 21.79 kg/m as calculated from the following:   Height as of this encounter: 4\' 10"  (1.473 m).   Weight as of this encounter: 47.3 kg.  Discharge Instructions   Allergies as of 11/19/2018      Reactions   Morphine Dermatitis, Other (See Comments)   Skin turned red Other reaction(s): Other (See Comments) Skin turned red      Medication List    STOP taking these medications   butalbital-acetaminophen-caffeine 50-325-40 MG tablet Commonly known as:  FIORICET, ESGIC   lubiprostone 24 MCG capsule Commonly known as:  AMITIZA     TAKE these medications   acetaminophen 650 MG CR tablet Commonly known as:  TYLENOL 8 HOUR Take 1 tablet (650 mg total) by mouth every 8 (eight) hours as needed. What changed:  reasons to take this   amantadine 100 MG capsule Commonly known as:  SYMMETREL Take 100 mg by mouth daily.   azelastine 0.1 % nasal spray Commonly known as:  ASTELIN Place 2 sprays into both nostrils 2 (two) times daily. Use in  each nostril as directed   baclofen 20 MG tablet Commonly known as:  LIORESAL Take 20 mg by mouth 4 (four) times daily. For bladder spasms   bisacodyl 10 MG suppository Commonly known as:  DULCOLAX Place 10 mg rectally 2 (two) times daily as needed for moderate constipation.   Buprenorphine HCl 450 MCG Film Commonly known as:  BELBUCA Place 450 mcg inside cheek every 12 (twelve) hours.   busPIRone 5 MG tablet Commonly known as:  BUSPAR Take 5 mg by mouth 3 (three) times daily.   CERTA-VITE PO Take 1 tablet by mouth daily.   clonazePAM 0.5 MG tablet Commonly known as:  KLONOPIN Take 1.5 tablets (0.75 mg total) by mouth 3 (three) times daily.   feeding supplement (ENSURE ENLIVE) Liqd Take 237 mLs by mouth 2 (two) times daily between meals.   ferrous sulfate 325 (65 FE) MG tablet Take 325 mg by mouth daily.   FLUoxetine 40 MG capsule Commonly known as:  PROZAC Take 80 mg by mouth  daily.   guaiFENesin 600 MG 12 hr tablet Commonly known as:  MUCINEX Take 600 mg by mouth 2 (two) times daily.   hydroxychloroquine 200 MG tablet Commonly known as:  PLAQUENIL Take 200 mg by mouth daily.   lamoTRIgine 100 MG tablet Commonly known as:  LAMICTAL Take 100 mg by mouth at bedtime.   linaclotide 145 MCG Caps capsule Commonly known as:  LINZESS Take 145 mcg by mouth daily before breakfast.   Melatonin 5 MG Tabs Take 1 tablet by mouth at bedtime.   ondansetron 4 MG tablet Commonly known as:  ZOFRAN Take 1 tablet (4 mg total) by mouth every 6 (six) hours as needed for nausea.   oxybutynin 5 MG tablet Commonly known as:  DITROPAN Take 5 mg by mouth 4 (four) times daily.   oxyCODONE-acetaminophen 5-325 MG tablet Commonly known as:  PERCOCET/ROXICET Take 1 tablet by mouth every 4 (four) hours as needed for severe pain.   polyethylene glycol powder powder Commonly known as:  GLYCOLAX/MIRALAX Take 1  1/2 dose  daily in 12 ounces of fluid every day. What changed:    how  much to take  how to take this  when to take this  additional instructions   QUEtiapine 100 MG tablet Commonly known as:  SEROQUEL Take 1 tablet (100 mg total) by mouth at bedtime. What changed:    medication strength  how much to take   rivaroxaban 10 MG Tabs tablet Commonly known as:  XARELTO Take 10 mg by mouth daily.   ROXICODONE 5 MG immediate release tablet Generic drug:  oxyCODONE Take 5 mg by mouth every 4 (four) hours as needed for moderate pain or severe pain.   Oxycodone HCl 10 MG Tabs Take 10 mg by mouth 4 (four) times daily.   tiZANidine 4 MG capsule Commonly known as:  ZANAFLEX Take 4 mg by mouth 2 (two) times daily.   topiramate 100 MG tablet Commonly known as:  TOPAMAX Take 1 tablet at bedtime       Allergies  Allergen Reactions  . Morphine Dermatitis and Other (See Comments)    Skin turned red Other reaction(s): Other (See Comments) Skin turned red    Consultations:  None   Procedures/Studies: Dg Chest Port 1 View  Result Date: 11/18/2018 CLINICAL DATA:  Sepsis EXAM: PORTABLE CHEST 1 VIEW COMPARISON:  08/30/2018 FINDINGS: There is no focal parenchymal opacity. There is no pleural effusion or pneumothorax. The heart and mediastinal contours are unremarkable. There is anterior and posterior cervicothoracic fusion. There is no acute osseous abnormality. There is gaseous distension of the colon. IMPRESSION: No acute cardiopulmonary disease. Electronically Signed   By: Elige KoHetal  Patel   On: 11/18/2018 14:25   (Echo, Carotid, EGD, Colonoscopy, ERCP)    Subjective: Patient resting in bed no acute distress answers questions appropriately.  [facility]  Discharge Exam: Vitals:   11/19/18 0617 11/19/18 0852  BP: 94/71 94/67  Pulse: 66 66  Resp: 16 20  Temp: 98.4 F (36.9 C) 98.2 F (36.8 C)  SpO2: 98% 96%   Vitals:   11/18/18 1955 11/19/18 0054 11/19/18 0617 11/19/18 0852  BP:  109/74 94/71 94/67   Pulse:   (!) 53 66 66  Resp:  18 16 20   Temp:  98.4 F (36.9 C) 98.4 F (36.9 C) 98.2 F (36.8 C)  TempSrc:  Oral  Oral  SpO2:  95% 98% 96%  Weight: 47.3 kg     Height:  4\' 10"  (1.473 m)       General: Pt is alert, awake, not in acute distress Cardiovascular: RRR, S1/S2 +, no rubs, no gallops Respiratory: CTA bilaterally, no wheezing, no rhonchi Abdominal: Soft, NT, ND, bowel sounds + Extremities: Contracted.   The results of significant diagnostics from this hospitalization (including imaging, microbiology, ancillary and laboratory) are listed below for reference.     Microbiology: Recent Results (from the past 240 hour(s))  Blood Culture (routine x 2)     Status: None (Preliminary result)   Collection Time: 11/18/18  1:55 PM  Result Value Ref Range Status   Specimen Description BLOOD RIGHT ANTECUBITAL  Final   Special Requests   Final    BOTTLES DRAWN AEROBIC AND ANAEROBIC Blood Culture adequate volume   Culture   Final    NO GROWTH < 24 HOURS Performed at [facility], [address], [place], [place] [address]    Report Status PENDING  Incomplete  Urine culture     Status: Abnormal (Preliminary result)   Collection Time: 11/18/18  2:00 PM  Result Value Ref Range Status   Specimen Description URINE, SUPRAPUBIC  Final   Special Requests   Final    NONE Performed at [facility], [address] [place], [place] [address]    Culture >=100,000 COLONIES/mL GRAM NEGATIVE RODS (A)  Final   Report Status PENDING  Incomplete  Blood Culture (routine x 2)     Status: None (Preliminary result)   Collection Time: 11/18/18  2:10 PM  Result Value Ref Range Status   Specimen Description BLOOD LEFT HAND  Final   Special Requests   Final    BOTTLES DRAWN AEROBIC AND ANAEROBIC Blood Culture results may not be optimal due to an excessive volume of blood received in culture bottles   Culture   Final    NO GROWTH < 24 HOURS Performed at [facility], [address],  [place], [place] [address]    Report Status PENDING  Incomplete     Labs: BNP (last 3 results) No results for input(s): BNP in the last 8760 hours. Basic Metabolic Panel: Recent Labs  Lab 11/18/18 1400 11/19/18 0633  NA 139 140  K 4.4 3.9  CL 105 111  CO2 23 20*  GLUCOSE 84 78  BUN 11 9  CREATININE 0.76 0.61  CALCIUM 8.7* 8.3*   Liver Function Tests: Recent Labs  Lab 11/18/18 1400  AST 24  ALT 21  ALKPHOS 66  BILITOT 0.6  PROT 6.6  ALBUMIN 3.8   No results for input(s): LIPASE, AMYLASE in the last 168 hours. No results for input(s): AMMONIA in the last 168 hours. CBC: Recent Labs  Lab 11/18/18 1400 11/19/18 0633  WBC 6.3 4.8  NEUTROABS 2.7  --   HGB 11.5* 11.4*  HCT 37.3 35.6*  MCV 99.2 95.4  PLT 204 173   Cardiac Enzymes: No results for input(s): CKTOTAL, CKMB, CKMBINDEX, TROPONINI in the last 168 hours. BNP: Invalid input(s): POCBNP CBG: No results for input(s): GLUCAP in the last 168 hours. D-Dimer No results for input(s): DDIMER in the last 72 hours. Hgb A1c No results for input(s): HGBA1C in the last 72 hours. Lipid Profile No results for input(s): CHOL, HDL, LDLCALC, TRIG, CHOLHDL, LDLDIRECT in the last 72 hours. Thyroid function studies No results for input(s): TSH, T4TOTAL, T3FREE, THYROIDAB in the last 72 hours.  Invalid input(s): FREET3 Anemia work up No results for input(s): VITAMINB12, FOLATE, FERRITIN,  TIBC, IRON, RETICCTPCT in the last 72 hours. Urinalysis    Component Value Date/Time   COLORURINE AMBER (A) 11/18/2018 1400   APPEARANCEUR CLOUDY (A) 11/18/2018 1400   LABSPEC 1.012 11/18/2018 1400   PHURINE 7.0 11/18/2018 1400   GLUCOSEU NEGATIVE 11/18/2018 1400   HGBUR SMALL (A) 11/18/2018 1400   HGBUR large 10/03/2010 1530   BILIRUBINUR NEGATIVE 11/18/2018 1400   BILIRUBINUR neg 03/15/2011 1719   KETONESUR NEGATIVE 11/18/2018 1400   PROTEINUR NEGATIVE 11/18/2018 1400   UROBILINOGEN 1.0 10/08/2015 2348   NITRITE POSITIVE (A)  11/18/2018 1400   LEUKOCYTESUR LARGE (A) 11/18/2018 1400   LEUKOCYTESUR mod 01/26/2014   Sepsis Labs Invalid input(s): PROCALCITONIN,  WBC,  LACTICIDVEN Microbiology Recent Results (from the past 240 hour(s))  Blood Culture (routine x 2)     Status: None (Preliminary result)   Collection Time: 11/18/18  1:55 PM  Result Value Ref Range Status   Specimen Description BLOOD RIGHT ANTECUBITAL  Final   Special Requests   Final    BOTTLES DRAWN AEROBIC AND ANAEROBIC Blood Culture adequate volume   Culture   Final    NO GROWTH < 24 HOURS Performed at [facility], [address] [place], [place] [address]    Report Status PENDING  Incomplete  Urine culture     Status: Abnormal (Preliminary result)   Collection Time: 11/18/18  2:00 PM  Result Value Ref Range Status   Specimen Description URINE, SUPRAPUBIC  Final   Special Requests   Final    NONE Performed at [facility], [address] [place], [place] [address]    Culture >=100,000 COLONIES/mL GRAM NEGATIVE RODS (A)  Final   Report Status PENDING  Incomplete  Blood Culture (routine x 2)     Status: None (Preliminary result)   Collection Time: 11/18/18  2:10 PM  Result Value Ref Range Status   Specimen Description BLOOD LEFT HAND  Final   Special Requests   Final    BOTTLES DRAWN AEROBIC AND ANAEROBIC Blood Culture results may not be optimal due to an excessive volume of blood received in culture bottles   Culture   Final    NO GROWTH < 24 HOURS Performed at [facility], [address] [place], [place] [address]    Report Status PENDING  Incomplete     Time coordinating discharge: Over 30 minutes  SIGNED:   Alwyn RenElizabeth G Sofiya Ezelle, MD  [facility] 11/19/2018, 11:03 AM Pager   If 7PM-7AM, please contact night-coverage [email or web address] Password [email or web address]

## 2018-11-19 NOTE — Care Management Obs Status (Signed)
MEDICARE OBSERVATION STATUS NOTIFICATION   Patient Details  Name: [name] SlickerJudith [name] MRN: [id number] Date of Birth: 14-May-1976   Medicare Observation Status Notification Given:  Yes    Gala LewandowskyGraves-Bigelow, Haydn Hutsell Kaye, RN 11/19/2018, 11:01 AM

## 2018-11-19 NOTE — Care Management CC44 (Signed)
Condition Code 44 Documentation Completed  Patient Details  Name: [name] SlickerJudith [name] MRN: [id number] Date of Birth: 1976-06-26   Condition Code 44 given:  Yes Patient signature on Condition Code 44 notice:  Yes Documentation of 2 MD's agreement:  Yes Code 44 added to claim:  Yes    Gala LewandowskyGraves-Bigelow, Tameeka Luo Kaye, RN 11/19/2018, 11:02 AM

## 2018-11-19 NOTE — [place] FL2 (Signed)
[place] MEDICAID FL2 LEVEL OF CARE SCREENING TOOL     IDENTIFICATION  Patient Name: [name] SlickerJudith [name] Birthdate: May 05, 1976 Sex: female Admission Date (Current Location): 11/18/2018  [facility] [place]   Facility and Address:  [facility] [address] [facility], [address], [place], [place] [address]      Provider Number: [id number]  Attending Physician Name and Address:  Alwyn RenMathews, Elizabeth G, MD  Relative Name and Phone Number:  [name] [name] - [phone]    Current Level of Care: Hospital Recommended Level of Care: Skilled Nursing Facility([facility]) Prior Approval Number:    Date Approved/Denied:   PASRR Number:    Discharge Plan: SNF    Current Diagnoses: Patient Active Problem List   Diagnosis Date Noted  . Hypotension 11/19/2018  . Complicated UTI (urinary tract infection) 11/18/2018  . Solitary pulmonary nodule on lung CT 10/28/2018  . Connective tissue disorder ([name]) 08/07/2018  . Migraine without aura and without status migrainosus, not intractable 06/26/2018  . UTI (urinary tract infection) 05/25/2018  . Headache 05/25/2018  . Incontinent of feces 03/11/2016  . Chronic anticoagulation 03/11/2016  . Involuntary movements 02/22/2016  . Seizures ([name]) 02/12/2016  . Dyspnea 02/12/2016  . Cerebral palsy, quadriplegic ([name]) 02/06/2016  . Swelling of extremity 01/13/2016  . Fever   . Pressure ulcer 10/09/2015  . Status post insertion of inferior vena caval filter 09/28/2015  . Absence of bladder continence 08/25/2015  . Person living in residential institution 06/14/2015  . Pulmonary hypertension ([name])   . Anemia of chronic disease   . Spasticity 03/15/2015  . Constipation, chronic 10/10/2014  . Athetoid cerebral palsy ([name]) 04/13/2014  . Dystonia 04/12/2014  . UTI (lower urinary tract infection) 03/16/2014  . Presence of intrathecal baclofen pump 03/07/2014  . Dental caries 07/18/2013  . Multiple thyroid nodules  05/20/2013  . Osteoarthrosis 04/07/2013  . [name]   . Dysarthria   . GERD (gastroesophageal reflux disease)   . Neurogenic bladder 02/05/2013  . Anxiety state 10/21/2012  . Chronic pain syndrome 06/17/2012  . Recurrent pulmonary embolism ([name]) 05/03/2011  . Pulmonary embolism with infarction ([name]) 05/03/2011  . Chronic suprapubic catheter ([name]) 03/15/2011  . Disease of female genital organs 10/24/2010  . HYPERTENSION, BENIGN 01/31/2010  . CP (cerebral palsy), spastic ([name]) 12/27/2008  . Contracted, joint, multiple sites 12/27/2008  . Major depressive disorder, recurrent episode ([name]) 01/29/2007  . Obsessive Compulsive Disorder 01/29/2007  . Transient alteration of awareness, recurrent 09/08/2006    Orientation RESPIRATION BLADDER Height & Weight     Self, Time, Situation, Place  Normal Indwelling catheter(Suprapubic catheter) Weight: 104 lb 4.4 oz (47.3 kg) Height:  4\' 10"  (147.3 cm)  BEHAVIORAL SYMPTOMS/MOOD NEUROLOGICAL BOWEL NUTRITION STATUS    Convulsions/Seizures(History of seizures) Continent Diet(Cardiac)  AMBULATORY STATUS COMMUNICATION OF NEEDS Skin   Total Care Verbally Other (Comment)(MASD to mid sacrum with foam dressing; Excoriated right abdomen)                       Personal Care Assistance Level of Assistance  Bathing, Feeding, Dressing Bathing Assistance: Maximum assistance Feeding assistance: Maximum assistance Dressing Assistance: Maximum assistance     Functional Limitations Info  Sight, Hearing, Speech Sight Info: Adequate Hearing Info: Adequate Speech Info: Impaired    SPECIAL CARE FACTORS FREQUENCY                       Contractures Contractures Info: Present(RUE and LUE)  Additional Factors Info  Code Status, Allergies, Isolation Precautions Code Status Info: Full Allergies Info: Morphine     Isolation Precautions Info: ESBL and VRE     Current Medications (11/19/2018):  This is the current  hospital active medication list Current Facility-Administered Medications  Medication Dose Route Frequency Provider Last Rate Last Dose  . 0.9 %  sodium chloride infusion   Intravenous PRN Jonah BlueYates, [name], MD   Stopped at 11/19/18 0008  . 0.9 %  sodium chloride infusion   Intravenous Continuous Alwyn RenMathews, Elizabeth G, MD 150 mL/hr at 11/19/18 1128    . acetaminophen (TYLENOL) tablet 650 mg  650 mg Oral Q6H PRN Jonah BlueYates, [name], MD       Or  . acetaminophen (TYLENOL) suppository 650 mg  650 mg Rectal Q6H PRN Jonah BlueYates, [name], MD      . amantadine (SYMMETREL) capsule 100 mg  100 mg Oral Daily Jonah BlueYates, [name], MD   100 mg at 11/19/18 1122  . azelastine (ASTELIN) 0.1 % nasal spray 2 spray  2 spray Each Nare BID Jonah BlueYates, [name], MD   2 spray at 11/19/18 1122  . baclofen (LIORESAL) tablet 20 mg  20 mg Oral QID Jonah BlueYates, [name], MD      . bisacodyl (DULCOLAX) suppository 10 mg  10 mg Rectal BID PRN Jonah BlueYates, [name], MD      . busPIRone (BUSPAR) tablet 5 mg  5 mg Oral TID Jonah BlueYates, [name], MD   5 mg at 11/19/18 1030  . clonazePAM (KLONOPIN) disintegrating tablet 0.75 mg  0.75 mg Oral TID Alwyn RenMathews, Elizabeth G, MD      . Melene Muller[START ON 11/20/2018] ferrous sulfate tablet 325 mg  325 mg Oral Q breakfast Jonah BlueYates, [name], MD      . FLUoxetine (PROZAC) capsule 80 mg  80 mg Oral Daily Jonah BlueYates, [name], MD   80 mg at 11/19/18 1030  . guaiFENesin (MUCINEX) 12 hr tablet 600 mg  600 mg Oral BID Jonah BlueYates, [name], MD   600 mg at 11/19/18 1030  . hydroxychloroquine (PLAQUENIL) tablet 200 mg  200 mg Oral Daily Jonah BlueYates, [name], MD   200 mg at 11/19/18 1030  . lamoTRIgine (LAMICTAL) tablet 100 mg  100 mg Oral Noemi ChapelQHS Yates, [name], MD      . Melene Muller[START ON 11/20/2018] linaclotide Karlene Einstein(LINZESS) capsule 145 mcg  145 mcg Oral QAC breakfast Jonah BlueYates, [name], MD      . Melatonin TABS 4.5 mg  4.5 mg Oral Noemi ChapelQHS Yates, [name], MD      . ondansetron [facility](ZOFRAN) tablet 4 mg  4 mg Oral Q6H PRN Jonah BlueYates, [name], MD       Or  . ondansetron [facility](ZOFRAN) injection 4  mg  4 mg Intravenous Q6H PRN Jonah BlueYates, [name], MD      . oxybutynin (DITROPAN) tablet 5 mg  5 mg Oral QID Jonah BlueYates, [name], MD   5 mg at 11/19/18 1030  . oxyCODONE (Oxy IR/ROXICODONE) immediate release tablet 10 mg  10 mg Oral QID Jonah BlueYates, [name], MD   10 mg at 11/19/18 1030  . oxyCODONE (Oxy IR/ROXICODONE) immediate release tablet 5 mg  5 mg Oral Q4H PRN Schorr, Roma KayserKatherine P, NP   5 mg at 11/19/18 0953  . oxyCODONE-acetaminophen (PERCOCET/ROXICET) 5-325 MG per tablet 1 tablet  1 tablet Oral Q4H PRN Jonah BlueYates, [name], MD      . polyethylene glycol (MIRALAX / GLYCOLAX) packet 17 g  17 g Oral Daily Lawerance BachMcCarthy, Megan L, RPH   17 g at 11/19/18 1122  . QUEtiapine (SEROQUEL) tablet 150 mg  150  mg Oral Noemi Chapel, MD      . rivaroxaban Carlena Hurl) tablet 10 mg  10 mg Oral Daily Alwyn Ren, MD   10 mg at 11/19/18 1122  . tiZANidine (ZANAFLEX) tablet 4 mg  4 mg Oral BID Jonah Blue, MD   4 mg at 11/19/18 1029  . topiramate (TOPAMAX) tablet 100 mg  100 mg Oral QHS Jonah Blue, MD         Discharge Medications: Please see discharge summary for a list of discharge medications.  Relevant Imaging Results:  Relevant Lab Results:   Additional Information    Okey Dupre, Lazaro Arms, LCSW

## 2018-11-19 NOTE — Care Management Note (Addendum)
Case Management Note  Patient Details  Name: [name] SlickerJudith [name] MRN: [id number] Date of Birth: 06/15/1976  Subjective/Objective:  Pt presented for AMS- [facility]. CSW assisting with patient transition back to facility.                 Action/Plan: CC-44 presented. No further needs from CM at this time.   Expected Discharge Date:                  Expected Discharge Plan:  Skilled Nursing Facility  In-House Referral:  Clinical Social Work  Discharge planning Services  CM Consult  Post Acute Care Choice:  NA Choice offered to:  NA  DME Arranged:  N/A DME Agency:  NA  HH Arranged:  NA HH Agency:  NA  Status of Service:  Completed, signed off  If discussed at Long Length of Stay Meetings, dates discussed:    Additional Comments: 1126 11-19-18 Tomi BambergerBrenda Graves-Bigelow, RN,BSN Case Manager [phone] CM did speak with patients father. Her Passenger transport managerlectric Wheelchair is at the doctors office and father will have to pick up. Patient will need ambulance transport back to facility. CSW is aware- No further needs from CM at this time.  Gala LewandowskyGraves-Bigelow, Alfonza Toft Kaye, RN 11/19/2018, 11:14 AM

## 2018-11-19 NOTE — Clinical Social Work Note (Signed)
Clinical Social Work Assessment  Patient Details  Name: [name] [name] MRN: [id number] Date of Birth: [birth year]  Date of referral:  11/19/18               Reason for consult:  Discharge Planning                Permission sought to share information with:  Family Supports Permission granted to share information::  Yes, Verbal Permission Granted  Name::     [name] [name]  Agency::     Relationship::  Father  Contact Information:  [phone]  Housing/Transportation Living arrangements for the past 2 months:  Skilled Nursing Facility([facility]) Source of Information:  Patient Patient Interpreter Needed:  None Criminal Activity/Legal Involvement Pertinent to Current Situation/Hospitalization:  No - Comment as needed Significant Relationships:  Parents(Father [name] [name]) Lives with:  Facility Resident([facility]) Do you feel safe going back to the place where you live?  Yes Need for family participation in patient care:  Yes (Comment)  Care giving concerns:  Patient expressed no concerns regarding [facility], but did report to CSW that she will be going to live with a CNA next week and be cared for by the CNA.   Social Worker assessment / plan:  CSW talked with patient at the bedside. [name] [name] was lying in bed and was open to talking with CSW. Patient is sometimes difficult to understand, but was engaged in the conversation. She appeared excited to be leaving the facility and being cared for by someone in their home.  Employment status:  Disabled (Comment on whether or not currently receiving Disability) Insurance information:  Medicare, [place] Recommendations:  Not assessed at this time Information / Referral to community resources:  Other (Comment Required)(None needed or requested as patient returning to [facility])  Patient/Family's Response to care:  No concerns expressed by patient regarding her care during  hospitalization.  Patient/Family's Understanding of and Emotional Response to Diagnosis, Current Treatment, and Prognosis: Patient appeared to understand why she came to the hospital and wanted to assure that there was no changes to her medications.  Emotional Assessment Appearance:  Appears stated age Attitude/Demeanor/Rapport:  Engaged Affect (typically observed):  Appropriate Orientation:  Oriented to Self, Oriented to Place, Oriented to  Time, Oriented to Situation Alcohol / Substance use:  Tobacco Use, Alcohol Use, Illicit Drugs(Per H&P patient has never smoked, drinks one glass of wine per week and does not use illicit drugs) Psych involvement (Current and /or in the community):  No (Comment)  Discharge Needs  Concerns to be addressed:  Discharge Planning Concerns Readmission within the last 30 days:  No Current discharge risk:  None Barriers to Discharge:  No Barriers Identified   [name] [name], [name] Homes [place], LCSW 11/19/2018, 4:20 PM

## 2018-11-20 ENCOUNTER — Encounter (INDEPENDENT_AMBULATORY_CARE_PROVIDER_SITE_OTHER): Payer: Self-pay | Admitting: Pediatrics

## 2018-11-20 ENCOUNTER — Telehealth (INDEPENDENT_AMBULATORY_CARE_PROVIDER_SITE_OTHER): Payer: Self-pay | Admitting: Pediatrics

## 2018-11-20 NOTE — Telephone Encounter (Signed)
Wheelchair has been picked up

## 2018-11-20 NOTE — Telephone Encounter (Signed)
°  Who's calling (name and relationship to patient) : Rudell Cobbom Jeffreys (Dad)  Best contact number: [phone]  Provider they see: Sharene SkeansHickling  Reason for call: [name] called to say that someone from [facility] is going to come by and pickup wheel chair.

## 2018-11-22 LAB — URINE CULTURE: Culture: >=100000 — AB

## 2018-11-23 LAB — CULTURE, BLOOD (ROUTINE X 2)
Culture: NO GROWTH
Culture: NO GROWTH
Special Requests: ADEQUATE

## 2018-12-14 ENCOUNTER — Encounter: Payer: Self-pay | Admitting: Family Medicine

## 2018-12-14 ENCOUNTER — Other Ambulatory Visit: Payer: Self-pay

## 2018-12-14 ENCOUNTER — Ambulatory Visit (INDEPENDENT_AMBULATORY_CARE_PROVIDER_SITE_OTHER): Payer: Medicare Other | Admitting: Family Medicine

## 2018-12-14 VITALS — BP 93/67 | HR 96 | Temp 98.6°F | Resp 17

## 2018-12-14 DIAGNOSIS — Z23 Encounter for immunization: Secondary | ICD-10-CM | POA: Diagnosis not present

## 2018-12-14 DIAGNOSIS — R569 Unspecified convulsions: Secondary | ICD-10-CM | POA: Diagnosis not present

## 2018-12-14 DIAGNOSIS — G808 Other cerebral palsy: Secondary | ICD-10-CM | POA: Diagnosis not present

## 2018-12-14 DIAGNOSIS — Z978 Presence of other specified devices: Secondary | ICD-10-CM

## 2018-12-14 DIAGNOSIS — N3945 Continuous leakage: Secondary | ICD-10-CM | POA: Diagnosis not present

## 2018-12-14 DIAGNOSIS — Z960 Presence of urogenital implants: Secondary | ICD-10-CM

## 2018-12-14 NOTE — Progress Notes (Signed)
New Patient Office Visit  Subjective:  Patient ID: [name] [name], female    DOB: [birth year]  Age: 43 y.o. MRN: [id number]  CC:  Chief Complaint  Patient presents with  . New Patient (Initial Visit)    establish care.  . needs rx for pullups and bed pads so they can be ordered fro    HPI [name] [name] presents for  This is a patient who is establishing care and needs refills of her incontinence supplies.  Her appointment was cut shorts by the early arrival of [facility] She states that she goes to Urology for her suprapubic catheter changes She needs gauzes and bed pads Her caregiver is Nolon Stalls [phone]  Seizure DO The patient is asking for a letter so that she can have wine once a day She states that her caregiver will not give her any She denies any current pain She has enough meds  She still works and took off time today She has a form for [employer] [facility] and Consultation services   She has OCD, anxiety, is on opiates and lamictal (for seizures)  Past Medical History:  Diagnosis Date  . Acute respiratory failure with hypoxia ([name])   . Anemia   . Cellulitis 12/22/2014  . Cerebral palsy ([name])    Spastic Cerebral palsy, mentally intact  . Contracture, joint, multiple sites    Electric wheelchair, uses left hand to operate chair.   . Depression   . [name]   . Dysarthria   . GERD (gastroesophageal reflux disease)   . Gram positive sepsis ([name]) 10/24/2014  . HCAP (healthcare-associated pneumonia)    2015, 2018  . History of endometriosis 10/2012   OBGYN [facility]: Laparoscopic Endometrial Ablation, TVH,  . History of recurrent UTIs   . Hypertension   . Incontinent of feces 03/11/2016  . Intracervical pessary 05/07/2011   Overview:  Overview:  Placed by [facility] 04/30/11 to treat DUB and Endometriosis.  She is seen at GYN clinic at [facility] but was considered a poor surgical candidate and referred her to [facility]  For DUB she  had pelvic ultrasound that showed thin stripe and she had endometrial Bx by Dr Jennette Kettle in GYN clinic, which was negative.     . Macroscopic hematuria 03/10/2013   status post replacement of suprapubic tube 03/04/2013   . Migraine   . OCD (obsessive compulsive disorder)   . Parapneumonic effusion 10/25/2014  . Presence of intrathecal baclofen pump 03/07/2014   R LQ. Insertion T10.    [name] Kitchen Psychiatric illness 10/31/2014  . Recurrent pulmonary embolism ([name])    Pt has history of recurrent PE and is on lifelong coumadin.   . Seizures ([name]) 02/12/2016  . Spasticity 01/05/2014  . Transient alteration of awareness, recurrent 09/08/2006   Recurrent episodes where Ashlin  loses contact with her world and that have been studied thoroughly and appear nonepileptic in nature per Dr Terrace Arabia (neurology) The patient has had episodes starting in 2007 that initially began 1-4 times a day during which time she would have periods of unawareness followed by confusion. This continuous video EEG monitoring performed from October 8-12, 2007. Had   . Urethra dilated and patulous 07/03/2015   Patulous, Dilated Urethra. 5mL balloon on a 22-Fr catheter hoping this would prevent the bladder spasm from pushing the balloon down the urethra (Dr Logan Bores, Urology [facility], July 2016)     Past Surgical History:  Procedure Laterality Date  . ABDOMINAL HYSTERECTOMY    .  APPENDECTOMY    . BACLOFEN PUMP REFILL     x 3 times  . CARPAL TUNNEL RELEASE  08/2008   Dr Teressa SenterSypher  . CESAREAN SECTION     x 2  . CHOLECYSTECTOMY  10/14/2015   Procedure: LAPAROSCOPIC CHOLECYSTECTOMY;  Surgeon: Violeta GelinasBurke Thompson, MD;  [facility];  Service: General;;  . COLPOSCOPY  06/2000  . INTRAUTERINE DEVICE INSERTION  04/30/11   [facility] for endometriosis  . LAPAROSCOPIC ASSISTED VAGINAL HYSTERECTOMY  10/27/2012  . MULTIPLE EXTRACTIONS WITH ALVEOLOPLASTY Bilateral 07/19/2013   Procedure: EXTRACTIONS #4, 1,61,09,605,11,16,19;  Surgeon: Francene Findershristopher L Coleman, DDS;   [facility];  Service: Oral Surgery;  Laterality: Bilateral;  . NEUROMA SURGERY Left    anterior and posterior intersosseous  . PAIN PUMP IMPLANTATION N/A 03/07/2014   Procedure: baclofen pump revision/replacement and Catheter connection replacement;  Surgeon: Cristi LoronJeffrey D Jenkins, MD;  Location: [facility] ORS;  Service: Neurosurgery;  Laterality: N/A;  baclofen pump revision/replacement and Catheter connection replacement  . PROGRAMABLE BACLOFEN PUMP REVISION  03/17/14   Battery Replacement   . TUBAL LIGATION  2003  . URETHRA SURGERY    . WRIST SURGERY  06/2010   Dr Dierdre SearlesLi, hand [facility]    Family History  Problem Relation Age of Onset  . Asthma Father   . Colon cancer Maternal Grandmother        Died in her 1760's  . Breast cancer Paternal Grandmother        Died in her 6170's  . Heart attack Maternal Grandfather        Died in his 1870's  . Alzheimer's disease Paternal Grandfather        Died in his 10380's  . Breast cancer Mother   . Stomach cancer Neg Hx     Social History   Socioeconomic History  . Marital status: Divorced    Spouse name: Not on file  . Number of children: 2  . Years of education: college  . Highest education level: Not on file  Occupational History    Employer: UNEMPLOYED  Social Needs  . Financial resource strain: Not on file  . Food insecurity:    Worry: Not on file    Inability: Not on file  . Transportation needs:    Medical: Not on file    Non-medical: Not on file  Tobacco Use  . Smoking status: Never Smoker  . Smokeless tobacco: Never Used  Substance and Sexual Activity  . Alcohol use: Yes    Alcohol/week: 1.0 standard drinks    Types: 1 Glasses of wine per week    Comment: Drinks alcohol once a month.  . Drug use: No  . Sexual activity: Never  Lifestyle  . Physical activity:    Days per week: Not on file    Minutes per session: Not on file  . Stress: Not on file  Relationships  . Social connections:    Talks on phone: Not on file     Gets together: Not on file    Attends religious service: Not on file    Active member of club or organization: Not on file    Attends meetings of clubs or organizations: Not on file    Relationship status: Not on file  . Intimate partner violence:    Fear of current or ex partner: Not on file    Emotionally abused: Not on file    Physically abused: Not on file    Forced sexual activity:  Not on file  Other Topics Concern  . 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    She enjoys shopping.   Lives in a nursing facility[facility] since 02/11/16      Ailene Ards, who has schizophrenia, MR, is father of daughter.  Homero Fellers and pt are no longer together.  Homero Fellers does not see [name]. Daughter is 68 yo, Levy Sjogren, lives in a group home in [place].    [name]'s mother reportedly has breast cancer.    Needs assistance with ADL's and IADL's--has personal care services 20 hrs/wk;       No tobacco, EtOH, drugs.      **Case Manager: Jack Quarto [phone]      Patient lives [facility]    ROS Review of Systems  Objective:   Today's Vitals: BP 93/67 (BP Location: Left Arm, Patient Position: Sitting, Cuff Size: Normal)   Pulse 96   Temp 98.6 F (37 C) (Oral)   Resp 17   LMP 04/05/2011   SpO2 98%   Physical Exam   Physical Exam  Constitutional: Oriented to person, place, and time. Appears well-developed and well-nourished.  HENT:  Head: Normocephalic and atraumatic.  Eyes: Conjunctivae and EOM are normal.  Cardiovascular: Normal rate, regular rhythm, normal heart sounds and intact distal pulses.  No murmur heard. Pulmonary/Chest: Effort normal and breath sounds normal. No stridor. No respiratory distress. Has no wheezes.  Neurological: Is alert and oriented to person, place, and time.  Skin: Skin is warm. Capillary refill takes less than 2 seconds.  Psychiatric: Has a  normal mood and affect. Behavior is normal. Judgment and thought content normal.  Pt is in a wheelchair Limited exam performed   Assessment & Plan:   Problem List Items Addressed This Visit      Nervous and Auditory   Cerebral palsy, quadriplegic ([name]) (Chronic) - continue current meds and routine care Completed home health updated orders     Other   Absence of bladder continence (Chronic) Sent in medical supply order for supplies    Other Visit Diagnoses    Indwelling Foley catheter present    -  Primary   Need for prophylactic vaccination and inoculation against influenza     Flu vaccination given    Seizure disorder -  Advised against alcohol given the combination of medications she is on   Outpatient Encounter Medications as of 12/14/2018  Medication Sig  . amantadine (SYMMETREL) 100 MG capsule Take 100 mg by mouth daily.   [name] Kitchen azelastine (ASTELIN) 0.1 % nasal spray Place 2 sprays into both nostrils 2 (two) times daily. Use in each nostril as directed  . baclofen (LIORESAL) 20 MG tablet Take 20 mg by mouth 4 (four) times daily. For bladder spasms  . busPIRone (BUSPAR) 5 MG tablet Take 5 mg by mouth 3 (three) times daily.  . clonazePAM (KLONOPIN) 0.5 MG tablet Take 1.5 tablets (0.75 mg total) by mouth 3 (three) times daily.  . feeding supplement, ENSURE ENLIVE, (ENSURE ENLIVE) LIQD Take 237 mLs by mouth 2 (two) times daily between meals.  . ferrous sulfate 325 (65 FE) MG tablet Take 325 mg by mouth daily.   [name] Kitchen FLUoxetine (PROZAC) 40 MG capsule Take 80 mg by mouth daily.  [name] Kitchen guaiFENesin (MUCINEX) 600 MG 12 hr tablet Take 600 mg by mouth 2 (two) times daily.  . hydroxychloroquine (PLAQUENIL) 200 MG tablet Take 200 mg by  mouth daily.  [name] Kitchen. lamoTRIgine (LAMICTAL) 100 MG tablet Take 100 mg by mouth at bedtime.  [name] Kitchen. linaclotide (LINZESS) 145 MCG CAPS capsule Take 145 mcg by mouth daily before breakfast.  . Melatonin 5 MG TABS Take 1 tablet by mouth at bedtime.  . Multiple Vitamins-Minerals  (CERTA-VITE PO) Take 1 tablet by mouth daily.  . ondansetron (ZOFRAN) 4 MG tablet Take 1 tablet (4 mg total) by mouth every 6 (six) hours as needed for nausea.  [name] Kitchen. oxybutynin (DITROPAN) 5 MG tablet Take 5 mg by mouth 4 (four) times daily.   [name] Kitchen. oxyCODONE (ROXICODONE) 5 MG immediate release tablet Take 5 mg by mouth every 4 (four) hours as needed for moderate pain or severe pain.  . Oxycodone HCl 10 MG TABS Take 10 mg by mouth 4 (four) times daily.  . polyethylene glycol powder (GLYCOLAX/MIRALAX) powder Take 1  1/2 dose  daily in 12 ounces of fluid every day. (Patient taking differently: Take 22.5 g by mouth daily. )  . QUEtiapine (SEROQUEL) 100 MG tablet Take 1 tablet (100 mg total) by mouth at bedtime.  . rivaroxaban (XARELTO) 10 MG TABS tablet Take 10 mg by mouth daily.  [name] Kitchen. tiZANidine (ZANAFLEX) 4 MG capsule Take 4 mg by mouth 2 (two) times daily.   [name] Kitchen. topiramate (TOPAMAX) 100 MG tablet Take 1 tablet at bedtime  . acetaminophen (TYLENOL 8 HOUR) 650 MG CR tablet Take 1 tablet (650 mg total) by mouth every 8 (eight) hours as needed. (Patient taking differently: Take 650 mg by mouth every 8 (eight) hours as needed for pain. )  . bisacodyl (DULCOLAX) 10 MG suppository Place 10 mg rectally 2 (two) times daily as needed for moderate constipation.   . Buprenorphine HCl (BELBUCA) 450 MCG FILM Place 450 mcg inside cheek every 12 (twelve) hours.  [name] Kitchen. oxyCODONE-acetaminophen (PERCOCET/ROXICET) 5-325 MG tablet Take 1 tablet by mouth every 4 (four) hours as needed for severe pain.    No facility-administered encounter medications on file as of 12/14/2018.     Follow-up: No follow-ups on file.   Doristine BosworthZoe A Yash Cacciola, MD

## 2018-12-14 NOTE — Patient Instructions (Signed)
° ° ° °  If you have lab work done today you will be contacted with your lab results within the next 2 weeks.  If you have not heard from us then please contact us. The fastest way to get your results is to register for My Chart. ° ° °IF you received an x-ray today, you will receive an invoice from [facility]. Please contact [facility] at [phone] with questions or concerns regarding your invoice.  ° °IF you received labwork today, you will receive an invoice from [facility]. Please contact [facility] at [phone] with questions or concerns regarding your invoice.  ° °Our billing staff will not be able to assist you with questions regarding bills from these companies. ° °You will be contacted with the lab results as soon as they are available. The fastest way to get your results is to activate your My Chart account. Instructions are located on the last page of this paperwork. If you have not heard from us regarding the results in 2 weeks, please contact this office. °  ° ° ° °

## 2018-12-17 ENCOUNTER — Ambulatory Visit (INDEPENDENT_AMBULATORY_CARE_PROVIDER_SITE_OTHER): Payer: Medicare Other | Admitting: Family

## 2018-12-28 ENCOUNTER — Telehealth (INDEPENDENT_AMBULATORY_CARE_PROVIDER_SITE_OTHER): Payer: Self-pay | Admitting: Pediatrics

## 2018-12-28 ENCOUNTER — Other Ambulatory Visit (INDEPENDENT_AMBULATORY_CARE_PROVIDER_SITE_OTHER): Payer: Self-pay | Admitting: Pediatrics

## 2018-12-28 MED ORDER — TIZANIDINE HCL 4 MG PO CAPS: 4.0000 mg | ORAL_CAPSULE | Freq: Two times a day (BID) | ORAL | 2 refills | Status: AC

## 2018-12-28 MED ORDER — BACLOFEN 20 MG PO TABS: 20.0000 mg | ORAL_TABLET | Freq: Four times a day (QID) | ORAL | 2 refills | Status: AC

## 2018-12-28 NOTE — Telephone Encounter (Signed)
°  Who's calling (name and relationship to patient) : Nolon Stalls, Home Care Nurse  Best contact number: [phone]  Provider they see: Dr. Sharene Skeans and Elveria Rising  Reason for call: Home Care Nurse called stating that patient was in nursing home a month ago, and placed out 12/23 and at that point they gave her a 30 day supply of all of her medications, she is now out of all of them, and so we need to get them refilled. Please let home care nurse know once refill has been completed, if there is anything additional needed before refill please advise.   Medications needed:   Baclofen 20 mg tablet Zanaflex 4 mg capsule     PRESCRIPTION REFILL ONLY  Name of prescription: Baclofen 20 mg tablet Zanaflex 4 mg capsule  Pharmacy: [facility] [address] [place], [place] [address]

## 2018-12-28 NOTE — Telephone Encounter (Signed)
Rx has been sent to the pharmacy electronically. ° °

## 2018-12-30 NOTE — Telephone Encounter (Signed)
error 

## 2019-01-01 ENCOUNTER — Encounter: Payer: Medicare Other | Attending: Physical Medicine & Rehabilitation

## 2019-01-01 ENCOUNTER — Ambulatory Visit: Payer: Medicare Other | Admitting: Physical Medicine & Rehabilitation

## 2019-01-04 ENCOUNTER — Other Ambulatory Visit: Payer: Self-pay

## 2019-01-04 ENCOUNTER — Ambulatory Visit (INDEPENDENT_AMBULATORY_CARE_PROVIDER_SITE_OTHER): Payer: Medicare Other | Admitting: Emergency Medicine

## 2019-01-04 ENCOUNTER — Encounter: Payer: Self-pay | Admitting: Emergency Medicine

## 2019-01-04 VITALS — BP 113/77 | HR 88 | Temp 98.2°F | Resp 16

## 2019-01-04 DIAGNOSIS — R05 Cough: Secondary | ICD-10-CM | POA: Insufficient documentation

## 2019-01-04 DIAGNOSIS — R438 Other disturbances of smell and taste: Secondary | ICD-10-CM | POA: Insufficient documentation

## 2019-01-04 DIAGNOSIS — R110 Nausea: Secondary | ICD-10-CM

## 2019-01-04 DIAGNOSIS — R059 Cough, unspecified: Secondary | ICD-10-CM | POA: Insufficient documentation

## 2019-01-04 MED ORDER — OMEPRAZOLE 20 MG PO CPDR: 20.0000 mg | DELAYED_RELEASE_CAPSULE | Freq: Every day | ORAL | 3 refills | Status: AC

## 2019-01-04 NOTE — Patient Instructions (Addendum)
     If you have lab work done today you will be contacted with your lab results within the next 2 weeks.  If you have not heard from us then please contact us. The fastest way to get your results is to register for My Chart.   IF you received an x-ray today, you will receive an invoice from [facility]. Please contact [facility] at [phone] with questions or concerns regarding your invoice.   IF you received labwork today, you will receive an invoice from [facility]. Please contact [facility] at [phone] with questions or concerns regarding your invoice.   Our billing staff will not be able to assist you with questions regarding bills from these companies.  You will be contacted with the lab results as soon as they are available. The fastest way to get your results is to activate your My Chart account. Instructions are located on the last page of this paperwork. If you have not heard from us regarding the results in 2 weeks, please contact this office.     Cough, Adult  A cough helps to clear your throat and lungs. A cough may last only 2-3 weeks (acute), or it may last longer than 8 weeks (chronic). Many different things can cause a cough. A cough may be a sign of an illness or another medical condition. Follow these instructions at home:  Pay attention to any changes in your cough.  Take medicines only as told by your doctor. ? If you were prescribed an antibiotic medicine, take it as told by your doctor. Do not stop taking it even if you start to feel better. ? Talk with your doctor before you try using a cough medicine.  Drink enough fluid to keep your pee (urine) clear or pale yellow.  If the air is dry, use a cold steam vaporizer or humidifier in your home.  Stay away from things that make you cough at work or at home.  If your cough is worse at night, try using extra pillows to raise your head up higher while you sleep.  Do not smoke, and try not to  be around smoke. If you need help quitting, ask your doctor.  Do not have caffeine.  Do not drink alcohol.  Rest as needed. Contact a doctor if:  You have new problems (symptoms).  You cough up yellow fluid (pus).  Your cough does not get better after 2-3 weeks, or your cough gets worse.  Medicine does not help your cough and you are not sleeping well.  You have pain that gets worse or pain that is not helped with medicine.  You have a fever.  You are losing weight and you do not know why.  You have night sweats. Get help right away if:  You cough up blood.  You have trouble breathing.  Your heartbeat is very fast. This information is not intended to replace advice given to you by your health care provider. Make sure you discuss any questions you have with your health care provider. Document Released: 08/01/2011 Document Revised: 04/25/2016 Document Reviewed: 01/25/2015 Elsevier Interactive Patient Education  2019 Elsevier Inc.  

## 2019-01-04 NOTE — Progress Notes (Signed)
[name] [name] 43 y.o.   Chief Complaint  Patient presents with  . Cough    started x 3 days nonproductive  . Nausea    x 2 weeks with bad tast in the mouth    HISTORY OF PRESENT ILLNESS: This is a 43 y.o. female with multiple chronic medical problems, unknown to me as a patient, complaining of cough, bad taste in her mouth, and nausea for the past several days.  No other significant symptoms.  On multiple medications.  HPI   Prior to Admission medications   Medication Sig Start Date End Date Taking? Authorizing Provider  acetaminophen (TYLENOL 8 HOUR) 650 MG CR tablet Take 1 tablet (650 mg total) by mouth every 8 (eight) hours as needed. Patient taking differently: Take 650 mg by mouth every 8 (eight) hours as needed for pain.  07/08/18  Yes Derwood Kaplan, MD  amantadine (SYMMETREL) 100 MG capsule Take 100 mg by mouth daily.    Yes [provider]  azelastine (ASTELIN) 0.1 % nasal spray Place 2 sprays into both nostrils 2 (two) times daily. Use in each nostril as directed   Yes [provider]  baclofen (LIORESAL) 20 MG tablet Take 1 tablet (20 mg total) by mouth 4 (four) times daily. For bladder spasms 12/28/18  Yes Elveria Rising, NP  bisacodyl (DULCOLAX) 10 MG suppository Place 10 mg rectally 2 (two) times daily as needed for moderate constipation.    Yes [provider]  Buprenorphine HCl (BELBUCA) 450 MCG FILM Place 450 mcg inside cheek every 12 (twelve) hours. 08/22/18  Yes Amin, Ankit Chirag, MD  busPIRone (BUSPAR) 5 MG tablet Take 5 mg by mouth 3 (three) times daily.   Yes [provider]  clonazePAM (KLONOPIN) 0.5 MG tablet Take 1.5 tablets (0.75 mg total) by mouth 3 (three) times daily. 11/27/17  Yes Narda Bonds, MD  feeding supplement, ENSURE ENLIVE, (ENSURE ENLIVE) LIQD Take 237 mLs by mouth 2 (two) times daily between meals. 05/30/18  Yes Hall, Carole N, DO  FLUoxetine (PROZAC) 40 MG capsule Take 80 mg by mouth daily.   Yes [provider]  guaiFENesin (MUCINEX) 600 MG 12 hr tablet Take 600 mg by mouth 2 (two) times daily.   Yes [provider]  hydroxychloroquine (PLAQUENIL) 200 MG tablet Take 200 mg by mouth daily.   Yes [provider]  lamoTRIgine (LAMICTAL) 100 MG tablet Take 100 mg by mouth at bedtime.   Yes [provider]  linaclotide (LINZESS) 145 MCG CAPS capsule Take 145 mcg by mouth daily before breakfast.   Yes [provider]  Melatonin 5 MG TABS Take 1 tablet by mouth at bedtime.   Yes [provider]  ondansetron (ZOFRAN) 4 MG tablet Take 1 tablet (4 mg total) by mouth every 6 (six) hours as needed for nausea. 11/19/18  Yes Alwyn Ren, MD  oxybutynin (DITROPAN) 5 MG tablet Take 5 mg by mouth 4 (four) times daily.  02/20/16  Yes [provider]  oxyCODONE (ROXICODONE) 5 MG immediate release tablet Take 5 mg by mouth every 4 (four) hours as needed for moderate pain or severe pain.   Yes [provider]  Oxycodone HCl 10 MG TABS Take 10 mg by mouth 4 (four) times daily.   Yes [provider]  oxyCODONE-acetaminophen (PERCOCET/ROXICET) 5-325 MG tablet Take 1 tablet by mouth every 4 (four) hours as needed for severe pain.    Yes [provider]  polyethylene glycol powder (GLYCOLAX/MIRALAX)  powder Take 1  1/2 dose  daily in 12 ounces of fluid every day. Patient taking differently: Take 22.5 g by mouth daily.  01/01/17  Yes Esterwood, [name] S, PA-C  QUEtiapine (SEROQUEL) 100 MG tablet Take 1 tablet (100 mg total) by mouth at bedtime. 11/19/18  Yes Alwyn Ren, MD  rivaroxaban (XARELTO) 10 MG TABS tablet Take 10 mg by mouth daily.   Yes [provider]  tiZANidine (ZANAFLEX) 4 MG capsule Take 1 capsule (4 mg total) by mouth 2 (two) times daily. 12/28/18  Yes Elveria Rising, NP  topiramate (TOPAMAX) 100 MG tablet Take 1 tablet at bedtime 09/17/18  Yes Elveria Rising, NP  ferrous sulfate 325 (65 FE) MG  tablet Take 325 mg by mouth daily.     [provider]  Multiple Vitamins-Minerals (CERTA-VITE PO) Take 1 tablet by mouth daily.    [provider]    Allergies  Allergen Reactions  . Morphine Dermatitis and Other (See Comments)    Skin turned red Other reaction(s): Other (See Comments) Skin turned red    Patient Active Problem List   Diagnosis Date Noted  . Hypotension 11/19/2018  . Complicated UTI (urinary tract infection) 11/18/2018  . Solitary pulmonary nodule on lung CT 10/28/2018  . Connective tissue disorder ([name]) 08/07/2018  . Migraine without aura and without status migrainosus, not intractable 06/26/2018  . UTI (urinary tract infection) 05/25/2018  . Headache 05/25/2018  . Incontinent of feces 03/11/2016  . Chronic anticoagulation 03/11/2016  . Involuntary movements 02/22/2016  . Seizures ([name]) 02/12/2016  . Dyspnea 02/12/2016  . Cerebral palsy, quadriplegic ([name]) 02/06/2016  . Swelling of extremity 01/13/2016  . Fever   . Pressure ulcer 10/09/2015  . Status post insertion of inferior vena caval filter 09/28/2015  . Absence of bladder continence 08/25/2015  . Person living in residential institution 06/14/2015  . Pulmonary hypertension ([name])   . Anemia of chronic disease   . Spasticity 03/15/2015  . Constipation, chronic 10/10/2014  . Athetoid cerebral palsy ([name]) 04/13/2014  . Dystonia 04/12/2014  . UTI (lower urinary tract infection) 03/16/2014  . Presence of intrathecal baclofen pump 03/07/2014  . Dental caries 07/18/2013  . Multiple thyroid nodules 05/20/2013  . Osteoarthrosis 04/07/2013  . [name]   . Dysarthria   . GERD (gastroesophageal reflux disease)   . Neurogenic bladder 02/05/2013  . Anxiety state 10/21/2012  . Chronic pain syndrome 06/17/2012  . Recurrent pulmonary embolism ([name]) 05/03/2011  . Pulmonary embolism with infarction ([name]) 05/03/2011  . Chronic suprapubic catheter ([name]) 03/15/2011  .  Disease of female genital organs 10/24/2010  . HYPERTENSION, BENIGN 01/31/2010  . CP (cerebral palsy), spastic ([name]) 12/27/2008  . Contracted, joint, multiple sites 12/27/2008  . Major depressive disorder, recurrent episode ([name]) 01/29/2007  . Obsessive Compulsive Disorder 01/29/2007  . Transient alteration of awareness, recurrent 09/08/2006    Past Medical History:  Diagnosis Date  . Acute respiratory failure with hypoxia ([name])   . Anemia   . Cellulitis 12/22/2014  . Cerebral palsy ([name])    Spastic Cerebral palsy, mentally intact  . Contracture, joint, multiple sites    Electric wheelchair, uses left hand to operate chair.   . Depression   . [name]   . Dysarthria   . GERD (gastroesophageal reflux disease)   . Gram positive sepsis ([name]) 10/24/2014  . HCAP (healthcare-associated pneumonia)    2015, 2018  . History of endometriosis 10/2012   OBGYN [facility]: Laparoscopic Endometrial Ablation,  TVH,  . History of recurrent UTIs   . Hypertension   . Incontinent of feces 03/11/2016  . Intracervical pessary 05/07/2011   Overview:  Overview:  Placed by [facility] 04/30/11 to treat DUB and Endometriosis.  She is seen at GYN clinic at [facility] but was considered a poor surgical candidate and referred her to [facility].  For DUB she had pelvic ultrasound that showed thin stripe and she had endometrial Bx by Dr Jennette Kettle in GYN clinic, which was negative.     . Macroscopic hematuria 03/10/2013   status post replacement of suprapubic tube 03/04/2013   . Migraine   . OCD (obsessive compulsive disorder)   . Parapneumonic effusion 10/25/2014  . Presence of intrathecal baclofen pump 03/07/2014   R LQ. Insertion T10.    [name] Kitchen Psychiatric illness 10/31/2014  . Recurrent pulmonary embolism ([name])    Pt has history of recurrent PE and is on lifelong coumadin.   . Seizures ([name]) 02/12/2016  . Spasticity 01/05/2014  . Transient alteration of awareness, recurrent 09/08/2006   Recurrent episodes  where Alanie  loses contact with her world and that have been studied thoroughly and appear nonepileptic in nature per Dr Terrace Arabia (neurology) The patient has had episodes starting in 2007 that initially began 1-4 times a day during which time she would have periods of unawareness followed by confusion. This continuous video EEG monitoring performed from October 8-12, 2007. Had   . Urethra dilated and patulous 07/03/2015   Patulous, Dilated Urethra. 5mL balloon on a 22-Fr catheter hoping this would prevent the bladder spasm from pushing the balloon down the urethra (Dr Logan Bores, Urology [facility], July 2016)     Past Surgical History:  Procedure Laterality Date  . ABDOMINAL HYSTERECTOMY    . APPENDECTOMY    . BACLOFEN PUMP REFILL     x 3 times  . CARPAL TUNNEL RELEASE  08/2008   Dr Teressa Senter  . CESAREAN SECTION     x 2  . CHOLECYSTECTOMY  10/14/2015   Procedure: LAPAROSCOPIC CHOLECYSTECTOMY;  Surgeon: Violeta Gelinas, MD;  Location: [facility] OR;  Service: General;;  . COLPOSCOPY  06/2000  . INTRAUTERINE DEVICE INSERTION  04/30/11   Inserted by [facility] for endometriosis  . LAPAROSCOPIC ASSISTED VAGINAL HYSTERECTOMY  10/27/2012  . MULTIPLE EXTRACTIONS WITH ALVEOLOPLASTY Bilateral 07/19/2013   Procedure: EXTRACTIONS #4, 9,62,95,28;  Surgeon: Francene Finders, DDS;  Location: [facility] OR;  Service: Oral Surgery;  Laterality: Bilateral;  . NEUROMA SURGERY Left    anterior and posterior intersosseous  . PAIN PUMP IMPLANTATION N/A 03/07/2014   Procedure: baclofen pump revision/replacement and Catheter connection replacement;  Surgeon: Cristi Loron, MD;  Location: [facility] OR[facility];  Service: Neurosurgery;  Laterality: N/A;  baclofen pump revision/replacement and Catheter connection replacement  . PROGRAMABLE BACLOFEN PUMP REVISION  03/17/14   Battery Replacement   . TUBAL LIGATION  2003  . URETHRA SURGERY    . WRIST SURGERY  06/2010   Dr Dierdre Searles, hand surgeon, [facility]    Social History   Socioeconomic History  .  Marital status: Divorced    Spouse name: Not on file  . Number of children: 2  . Years of education: college  . Highest education level: Not on file  Occupational History    Employer: UNEMPLOYED  Social Needs  . Financial resource strain: Not on file  . Food insecurity:    Worry: Not on file    Inability: Not on file  . Transportation needs:  Medical: Not on file    Non-medical: Not on file  Tobacco Use  . Smoking status: Never Smoker  . Smokeless tobacco: Never Used  Substance and Sexual Activity  . Alcohol use: Yes    Alcohol/week: 1.0 standard drinks    Types: 1 Glasses of wine per week    Comment: Drinks alcohol once a month.  . Drug use: No  . Sexual activity: Never  Lifestyle  . Physical activity:    Days per week: Not on file    Minutes per session: Not on file  . Stress: Not on file  Relationships  . Social connections:    Talks on phone: Not on file    Gets together: Not on file    Attends religious service: Not on file    Active member of club or organization: Not on file    Attends meetings of clubs or organizations: Not on file    Relationship status: Not on file  . Intimate partner violence:    Fear of current or ex partner: Not on file    Emotionally abused: Not on file    Physically abused: Not on file    Forced sexual activity: Not on file  Other Topics Concern  . Not on file  Social History Narrative   [name] [name] graduated from college with an Scientist, research (physical sciences)Associates Degree in [employer].    She enjoys shopping.   Lives in a nursing facility[facility] since 02/11/16      Ailene ArdsFrank Haith, who has schizophrenia, MR, is father of daughter.  Homero FellersFrank and pt are no longer together.  Homero FellersFrank does not see [name] [name]. Daughter is 45 yo, Levy SjogrenKiarra Haith, lives in a group home in [place].    [name]'s mother reportedly has breast cancer.    Needs assistance with ADL's and IADL's--has personal care services 20 hrs/wk;       No tobacco, EtOH, drugs.        **Case Manager: [name] [name] [phone]      [facility]    Family History  Problem Relation Age of Onset  . Asthma Father   . Colon cancer Maternal Grandmother        Died in her 8060's  . Breast cancer Paternal Grandmother        Died in her 5970's  . Heart attack Maternal Grandfather        Died in his 7270's  . Alzheimer's disease Paternal Grandfather        Died in his 680's  . Breast cancer Mother   . Stomach cancer Neg Hx      Review of Systems  Constitutional: Negative for chills and fever.  HENT: Positive for congestion.   Respiratory: Positive for cough. Negative for shortness of breath.   Cardiovascular: Negative for chest pain.  Gastrointestinal: Positive for nausea. Negative for diarrhea and vomiting.  Skin: Negative for rash.  Neurological: Negative for dizziness and headaches.    Vitals:   01/04/19 1344  BP: 113/77  Pulse: 88  Resp: 16  Temp: 98.2 F (36.8 C)  SpO2: 97%    Physical Exam Vitals signs reviewed.  Constitutional:      Comments: Wheelchair-bound  HENT:     Head: Normocephalic.     Nose: Nose normal.     Mouth/Throat:     Mouth: Mucous membranes are moist.     Pharynx: Oropharynx is clear.  Eyes:     Extraocular Movements: Extraocular movements intact.  Pupils: Pupils are equal, round, and reactive to light.  Cardiovascular:     Rate and Rhythm: Normal rate and regular rhythm.     Heart sounds: Normal heart sounds.  Pulmonary:     Effort: Pulmonary effort is normal.     Breath sounds: Normal breath sounds.  Abdominal:     Palpations: Abdomen is soft.     Tenderness: There is no abdominal tenderness.  Musculoskeletal:     Comments: Contracted extremities  Lymphadenopathy:     Cervical: No cervical adenopathy.  Skin:    General: Skin is warm and dry.  Neurological:     Mental Status: She is alert.     Patient Instructions       If you have lab work done today you will be contacted with your  lab results within the next 2 weeks.  If you have not heard from [place] then please contact us. The fastest way to get your results is to register for My Chart.   IF you received an x-ray today, you will receive an invoice from [facility]. Please contact [facility] at [phone] with questions or concerns regarding your invoice.   IF you received labwork today, you will receive an invoice from [facility]. Please contact [facility] at [phone] with questions or concerns regarding your invoice.   Our billing staff will not be able to assist you with questions regarding bills from these companies.  You will be contacted with the lab results as soon as they are available. The fastest way to get your results is to activate your My Chart account. Instructions are located on the last page of this paperwork. If you have not heard from [place] regarding the results in 2 weeks, please contact this office.     Cough, Adult  A cough helps to clear your throat and lungs. A cough may last only 2-3 weeks (acute), or it may last longer than 8 weeks (chronic). Many different things can cause a cough. A cough may be a sign of an illness or another medical condition. Follow these instructions at home:  Pay attention to any changes in your cough.  Take medicines only as told by your doctor. ? If you were prescribed an antibiotic medicine, take it as told by your doctor. Do not stop taking it even if you start to feel better. ? Talk with your doctor before you try using a cough medicine.  Drink enough fluid to keep your pee (urine) clear or pale yellow.  If the air is dry, use a cold steam vaporizer or humidifier in your home.  Stay away from things that make you cough at work or at home.  If your cough is worse at night, try using extra pillows to raise your head up higher while you sleep.  Do not smoke, and try not to be around smoke. If you need help quitting, ask your doctor.  Do not  have caffeine.  Do not drink alcohol.  Rest as needed. Contact a doctor if:  You have new problems (symptoms).  You cough up yellow fluid (pus).  Your cough does not get better after 2-3 weeks, or your cough gets worse.  Medicine does not help your cough and you are not sleeping well.  You have pain that gets worse or pain that is not helped with medicine.  You have a fever.  You are losing weight and you do not know why.  You have night sweats. Get help right away if:  You cough up blood.  You have trouble breathing.  Your heartbeat is very fast. This information is not intended to replace advice given to you by your health care provider. Make sure you discuss any questions you have with your health care provider. Document Released: 08/01/2011 Document Revised: 04/25/2016 Document Reviewed: 01/25/2015 Elsevier Interactive Patient Education  2019 Elsevier Inc.     ASSESSMENT & PLAN: Adell was seen today for cough and nausea.  Diagnoses and all orders for this visit:  Cough  Nausea without vomiting Comments: Suspected reflux disease Orders: -     omeprazole (PRILOSEC) 20 MG capsule; Take 1 capsule (20 mg total) by mouth daily.  Bad taste in mouth      Edwina Barth, MD [facility]

## 2019-01-06 ENCOUNTER — Telehealth: Payer: Self-pay | Admitting: Family Medicine

## 2019-01-06 ENCOUNTER — Encounter: Payer: Self-pay | Admitting: Emergency Medicine

## 2019-01-06 NOTE — Telephone Encounter (Signed)
Copied from CRM [id number]. Topic: General - Other >> Jan 06, 2019  4:35 PM Arlyss Gandy, NT wrote: Reason for CRM: Pts caregiver requesting the rxs for bed pads, pull-ups and catheter bags to be faxed to her as she has been unable to pick this up for the pt at the office. Fax#: [phone]

## 2019-01-08 ENCOUNTER — Ambulatory Visit: Payer: Medicare Other | Admitting: Family Medicine

## 2019-01-11 ENCOUNTER — Telehealth (INDEPENDENT_AMBULATORY_CARE_PROVIDER_SITE_OTHER): Payer: Self-pay | Admitting: Family

## 2019-01-11 NOTE — Telephone Encounter (Signed)
°  Who's calling (name and relationship to patient) : Lauriann (Self) Best contact number: [phone] Provider they see: [name]  Reason for call: Pt would like to know if [name] would be willing to see her later in the afternoon at 4:30pm on Wednesday 2/12 instead of at 3pm.

## 2019-01-12 ENCOUNTER — Ambulatory Visit: Payer: Self-pay | Admitting: Family Medicine

## 2019-01-12 NOTE — Telephone Encounter (Signed)
Pt called with having severe abd pain that started today. She has not had a bm in over a week. The pains are sharp in her lower abd. Her abd is hard and distended. And she stated she could not hear any gurgling sounds. She has also been taking opioids for pain that she has. But she has also been taking metamucil also. She denies nausea or vomiting.  The pain is constant.  Pt sounds ill. Advised that she get someone to take her to the ED to be examine.  Pt voiced understanding and she will call someone to take her. Routing to flow at PCP.  Reason for Disposition . Patient sounds very sick or weak to the triager  Answer Assessment - Initial Assessment Questions 1. LOCATION: "Where does it hurt?"      Lower abd 2. RADIATION: "Does the pain shoot anywhere else?" (e.g., chest, back)     no 3. ONSET: "When did the pain begin?" (e.g., minutes, hours or days ago)      today 4. SUDDEN: "Gradual or sudden onset?"     sudden 5. PATTERN "Does the pain come and go, or is it constant?"    - If constant: "Is it getting better, staying the same, or worsening?"      (Note: Constant means the pain never goes away completely; most serious pain is constant and it progresses)     - If intermittent: "How long does it last?" "Do you have pain now?"     (Note: Intermittent means the pain goes away completely between bouts)     constant 6. SEVERITY: "How bad is the pain?"  (e.g., Scale 1-10; mild, moderate, or severe)   - MILD (1-3): doesn't interfere with normal activities, abdomen soft and not tender to touch    - MODERATE (4-7): interferes with normal activities or awakens from sleep, tender to touch    - SEVERE (8-10): excruciating pain, doubled over, unable to do any normal activities      Pain # 8, sharp pains 7. RECURRENT SYMPTOM: "Have you ever had this type of abdominal pain before?" If so, ask: "When was the last time?" and "What happened that time?"      no 8. CAUSE: "What do you think is causing  the abdominal pain?"     Last bm over a week 9. RELIEVING/AGGRAVATING FACTORS: "What makes it better or worse?" (e.g., movement, antacids, bowel movement)     nothing 10. OTHER SYMPTOMS: "Has there been any vomiting, diarrhea, constipation, or urine problems?"       constipation 11. PREGNANCY: "Is there any chance you are pregnant?" "When was your last menstrual period?"       N/a no periods  Protocols used: [facility]

## 2019-01-12 NOTE — Telephone Encounter (Signed)
I will do that but please let her know that she needs to be here by 4:15 to check in. I have updated her appointment. Thank you. Inetta Fermoina

## 2019-01-12 NOTE — Telephone Encounter (Signed)
What are you thoughts ?

## 2019-01-12 NOTE — Telephone Encounter (Signed)
rx's sent via fax to caregiver at [phone]. Dgaddy, CMA

## 2019-01-12 NOTE — Telephone Encounter (Signed)
Called and let patient know we would see her at 4:15 tomorrow. Patient verbalized understanding.

## 2019-01-13 ENCOUNTER — Emergency Department (HOSPITAL_COMMUNITY)
Admission: EM | Admit: 2019-01-13 | Discharge: 2019-01-13 | Disposition: A | Discharge status: Home / Self Care | Payer: Medicare Other | Attending: Emergency Medicine | Admitting: Emergency Medicine

## 2019-01-13 ENCOUNTER — Ambulatory Visit (INDEPENDENT_AMBULATORY_CARE_PROVIDER_SITE_OTHER): Payer: Medicare Other | Admitting: Family

## 2019-01-13 ENCOUNTER — Other Ambulatory Visit: Payer: Self-pay

## 2019-01-13 ENCOUNTER — Encounter (HOSPITAL_COMMUNITY): Payer: Self-pay

## 2019-01-13 ENCOUNTER — Emergency Department (HOSPITAL_COMMUNITY): Payer: Medicare Other

## 2019-01-13 DIAGNOSIS — Z7901 Long term (current) use of anticoagulants: Secondary | ICD-10-CM | POA: Diagnosis not present

## 2019-01-13 DIAGNOSIS — I10 Essential (primary) hypertension: Secondary | ICD-10-CM | POA: Diagnosis not present

## 2019-01-13 DIAGNOSIS — F329 Major depressive disorder, single episode, unspecified: Secondary | ICD-10-CM | POA: Diagnosis not present

## 2019-01-13 DIAGNOSIS — R109 Unspecified abdominal pain: Secondary | ICD-10-CM | POA: Diagnosis present

## 2019-01-13 DIAGNOSIS — K567 Ileus, unspecified: Secondary | ICD-10-CM | POA: Diagnosis not present

## 2019-01-13 DIAGNOSIS — G809 Cerebral palsy, unspecified: Secondary | ICD-10-CM | POA: Insufficient documentation

## 2019-01-13 DIAGNOSIS — Z9049 Acquired absence of other specified parts of digestive tract: Secondary | ICD-10-CM | POA: Diagnosis not present

## 2019-01-13 DIAGNOSIS — Z79899 Other long term (current) drug therapy: Secondary | ICD-10-CM | POA: Diagnosis not present

## 2019-01-13 DIAGNOSIS — K5903 Drug induced constipation: Secondary | ICD-10-CM | POA: Insufficient documentation

## 2019-01-13 LAB — CBC WITH DIFFERENTIAL/PLATELET
Abs Immature Granulocytes: 0.03 10*3/uL (ref 0.00–0.07)
Basophils Absolute: 0 10*3/uL (ref 0.0–0.1)
Basophils Relative: 1 %
Eosinophils Absolute: 0.2 10*3/uL (ref 0.0–0.5)
Eosinophils Relative: 4 %
HCT: 42.5 % (ref 36.0–46.0)
Hemoglobin: 13.5 g/dL (ref 12.0–15.0)
Immature Granulocytes: 1 %
Lymphocytes Relative: 29 %
Lymphs Abs: 1.5 10*3/uL (ref 0.7–4.0)
MCH: 31 pg (ref 26.0–34.0)
MCHC: 31.8 g/dL (ref 30.0–36.0)
MCV: 97.7 fL (ref 80.0–100.0)
Monocytes Absolute: 0.4 10*3/uL (ref 0.1–1.0)
Monocytes Relative: 7 %
Neutro Abs: 3.2 10*3/uL (ref 1.7–7.7)
Neutrophils Relative %: 58 %
Platelets: 261 10*3/uL (ref 150–400)
RBC: 4.35 MIL/uL (ref 3.87–5.11)
RDW: 13 % (ref 11.5–15.5)
WBC: 5.4 10*3/uL (ref 4.0–10.5)
nRBC: 0 % (ref 0.0–0.2)

## 2019-01-13 LAB — COMPREHENSIVE METABOLIC PANEL
ALK PHOS: 79 U/L (ref 38–126)
ALT: 19 U/L (ref 0–44)
AST: 18 U/L (ref 15–41)
Albumin: 4.3 g/dL (ref 3.5–5.0)
Anion gap: 8 (ref 5–15)
BUN: 9 mg/dL (ref 6–20)
CO2: 22 mmol/L (ref 22–32)
Calcium: 9.1 mg/dL (ref 8.9–10.3)
Chloride: 107 mmol/L (ref 98–111)
Creatinine, Ser: 0.54 mg/dL (ref 0.44–1.00)
GFR calc Af Amer: >60 mL/min (ref >60)
GFR calc non Af Amer: >60 mL/min (ref >60)
Glucose, Bld: 104 mg/dL — ABNORMAL HIGH (ref 70–99)
Potassium: 4 mmol/L (ref 3.5–5.1)
Sodium: 137 mmol/L (ref 135–145)
Total Bilirubin: 0.5 mg/dL (ref 0.3–1.2)
Total Protein: 7.9 g/dL (ref 6.5–8.1)

## 2019-01-13 LAB — LIPASE, BLOOD: Lipase: 30 U/L (ref 11–51)

## 2019-01-13 MED ORDER — MILK AND MOLASSES ENEMA
1.0000 | Freq: Once | RECTAL | Status: AC
  Administered 2019-01-13: 250 mL via RECTAL
  Filled 2019-01-13: qty 250

## 2019-01-13 NOTE — Telephone Encounter (Signed)
Confirmation faxed received on rx's faxed to caregiver Nolon Stalls [phone] Dgaddy, CMA

## 2019-01-13 NOTE — Discharge Instructions (Addendum)
We saw in the ER because of abdominal discomfort. X-rays revealed that you had some slowdown of your colon, likely because of your pain medications. You are given an enema in the ER which led to a good bowel movement.  We recommend that you continue with diet that is easy to digest -such as clear liquid diet. Continue taking MiraLAX. Drink plenty of water.  If your symptoms get worse or if you start passing gas, start having vomiting then come back to the ER.

## 2019-01-13 NOTE — ED Notes (Signed)
Patient transported to X-ray 

## 2019-01-13 NOTE — ED Notes (Signed)
[facility] has been called for transportation.  

## 2019-01-13 NOTE — ED Provider Notes (Signed)
[facility]   CSN: [id number] Arrival date & time: 01/13/19  1016     History   Chief Complaint Chief Complaint  Patient presents with  . Abdominal Pain  . Cough    HPI Sanayah Munro is a 43 y.o. female.  HPI  43 year old with history of cerebral palsy, constipation because of pain medication comes in with chief complaint of abdominal discomfort. Patient states that she has not had a good bowel movement in 7 days.  She has been taking MiraLAX without any relief.  She is also tried enema.  She states that she is having generalized abdominal pain without any fevers or chills.  She also denies any nausea or vomiting.  Patient is passing flatus.  Past Medical History:  Diagnosis Date  . Acute respiratory failure with hypoxia ([name])   . Anemia   . Cellulitis 12/22/2014  . Cerebral palsy ([name])    Spastic Cerebral palsy, mentally intact  . Contracture, joint, multiple sites    Electric wheelchair, uses left hand to operate chair.   . Depression   . [name]   . Dysarthria   . GERD (gastroesophageal reflux disease)   . Gram positive sepsis ([name]) 10/24/2014  . HCAP (healthcare-associated pneumonia)    2015, 2018  . History of endometriosis 10/2012   OBGYN [facility]: Laparoscopic Endometrial Ablation, TVH,  . History of recurrent UTIs   . Hypertension   . Incontinent of feces 03/11/2016  . Intracervical pessary 05/07/2011   Overview:  Overview:  Placed by [facility] 04/30/11 to treat DUB and Endometriosis.  She is seen at GYN clinic at [facility] but was considered a poor surgical candidate and referred her to [facility]  For DUB she had pelvic ultrasound that showed thin stripe and she had endometrial Bx by Dr Jennette Kettle in GYN clinic, which was negative.     . Macroscopic hematuria 03/10/2013   status post replacement of suprapubic tube 03/04/2013   . Migraine   . OCD (obsessive compulsive disorder)   . Parapneumonic  effusion 10/25/2014  . Presence of intrathecal baclofen pump 03/07/2014   R LQ. Insertion T10.    [name] Kitchen Psychiatric illness 10/31/2014  . Recurrent pulmonary embolism ([name])    Pt has history of recurrent PE and is on lifelong coumadin.   . Seizures ([name]) 02/12/2016  . Spasticity 01/05/2014  . Transient alteration of awareness, recurrent 09/08/2006   Recurrent episodes where Shaquasia  loses contact with her world and that have been studied thoroughly and appear nonepileptic in nature per Dr Terrace Arabia (neurology) The patient has had episodes starting in 2007 that initially began 1-4 times a day during which time she would have periods of unawareness followed by confusion. This continuous video EEG monitoring performed from October 8-12, 2007. Had   . Urethra dilated and patulous 07/03/2015   Patulous, Dilated Urethra. 5mL balloon on a 22-Fr catheter hoping this would prevent the bladder spasm from pushing the balloon down the urethra (Dr Logan Bores, Urology [facility], July 2016)     Patient Active Problem List   Diagnosis Date Noted  . Cough 01/04/2019  . Bad taste in mouth 01/04/2019  . Hypotension 11/19/2018  . Complicated UTI (urinary tract infection) 11/18/2018  . Solitary pulmonary nodule on lung CT 10/28/2018  . Connective tissue disorder ([name]) 08/07/2018  . Migraine without aura and without status migrainosus, not intractable 06/26/2018  . UTI (urinary tract infection) 05/25/2018  . Headache 05/25/2018  . Incontinent  of feces 03/11/2016  . Chronic anticoagulation 03/11/2016  . Involuntary movements 02/22/2016  . Seizures ([name]) 02/12/2016  . Dyspnea 02/12/2016  . Cerebral palsy, quadriplegic ([name]) 02/06/2016  . Swelling of extremity 01/13/2016  . Nausea without vomiting 11/09/2015  . Fever   . Pressure ulcer 10/09/2015  . Status post insertion of inferior vena caval filter 09/28/2015  . Absence of bladder continence 08/25/2015  . Person living in residential institution 06/14/2015  . Pulmonary  hypertension ([name])   . Anemia of chronic disease   . Spasticity 03/15/2015  . Constipation, chronic 10/10/2014  . Athetoid cerebral palsy ([name]) 04/13/2014  . Dystonia 04/12/2014  . UTI (lower urinary tract infection) 03/16/2014  . Presence of intrathecal baclofen pump 03/07/2014  . Dental caries 07/18/2013  . Multiple thyroid nodules 05/20/2013  . Osteoarthrosis 04/07/2013  . [name]   . Dysarthria   . GERD (gastroesophageal reflux disease)   . Neurogenic bladder 02/05/2013  . Anxiety state 10/21/2012  . Chronic pain syndrome 06/17/2012  . Recurrent pulmonary embolism ([name]) 05/03/2011  . Pulmonary embolism with infarction ([name]) 05/03/2011  . Chronic suprapubic catheter ([name]) 03/15/2011  . Disease of female genital organs 10/24/2010  . HYPERTENSION, BENIGN 01/31/2010  . CP (cerebral palsy), spastic ([name]) 12/27/2008  . Contracted, joint, multiple sites 12/27/2008  . Major depressive disorder, recurrent episode ([name]) 01/29/2007  . Obsessive Compulsive Disorder 01/29/2007  . Transient alteration of awareness, recurrent 09/08/2006    Past Surgical History:  Procedure Laterality Date  . ABDOMINAL HYSTERECTOMY    . APPENDECTOMY    . BACLOFEN PUMP REFILL     x 3 times  . CARPAL TUNNEL RELEASE  08/2008   Dr Teressa SenterSypher  . CESAREAN SECTION     x 2  . CHOLECYSTECTOMY  10/14/2015   Procedure: LAPAROSCOPIC CHOLECYSTECTOMY;  Surgeon: Violeta GelinasBurke Thompson, MD;  [facility];  Service: General;;  . COLPOSCOPY  06/2000  . INTRAUTERINE DEVICE INSERTION  04/30/11   [facility]  . LAPAROSCOPIC ASSISTED VAGINAL HYSTERECTOMY  10/27/2012  . MULTIPLE EXTRACTIONS WITH ALVEOLOPLASTY Bilateral 07/19/2013   Procedure: EXTRACTIONS #4, 1,61,09,605,11,16,19;  Surgeon: Francene Findershristopher L [place], DDS;  [facility];  Service: Oral Surgery;  Laterality: Bilateral;  . NEUROMA SURGERY Left    anterior and posterior intersosseous  . PAIN PUMP IMPLANTATION N/A 03/07/2014     Procedure: baclofen pump revision/replacement and Catheter connection replacement;  Surgeon: Cristi LoronJeffrey D Jenkins, MD;  Location: [facility] OR[facility];  Service: Neurosurgery;  Laterality: N/A;  baclofen pump revision/replacement and Catheter connection replacement  . PROGRAMABLE BACLOFEN PUMP REVISION  03/17/14   Battery Replacement   . TUBAL LIGATION  2003  . URETHRA SURGERY    . WRIST SURGERY  06/2010   Dr Dierdre SearlesLi[facility]     OB History   No obstetric history on file.      Home Medications    Prior to Admission medications   Medication Sig Start Date End Date Taking? Authorizing Provider  acetaminophen (TYLENOL 8 HOUR) 650 MG CR tablet Take 1 tablet (650 mg total) by mouth every 8 (eight) hours as needed. Patient taking differently: Take 1,300 mg by mouth 2 (two) times daily as needed for pain.  07/08/18  Yes Derwood KaplanNanavati, Aylene Acoff, MD  amantadine (SYMMETREL) 100 MG capsule Take 100 mg by mouth daily.    Yes [provider]  azelastine (ASTELIN) 0.1 % nasal spray Place 2 sprays into both nostrils 2 (two) times daily. Use in  each nostril as directed   Yes [provider]  baclofen (LIORESAL) 20 MG tablet Take 1 tablet (20 mg total) by mouth 4 (four) times daily. For bladder spasms 12/28/18  Yes Elveria RisingGoodpasture, Tina, NP  bisacodyl (DULCOLAX) 10 MG suppository Place 10 mg rectally 2 (two) times daily as needed for moderate constipation.    Yes [provider]  Buprenorphine HCl (BELBUCA) 450 MCG FILM Place 450 mcg inside cheek every 12 (twelve) hours. 08/22/18  Yes Amin, Faryal Marxen Chirag, MD  busPIRone (BUSPAR) 5 MG tablet Take 5 mg by mouth 3 (three) times daily.   Yes [provider]  clonazePAM (KLONOPIN) 0.5 MG tablet Take 1.5 tablets (0.75 mg total) by mouth 3 (three) times daily. 11/27/17  Yes Narda BondsNettey, Ralph A, MD  FLUoxetine (PROZAC) 40 MG capsule Take 80 mg by mouth daily.   Yes [provider]  guaiFENesin (MUCINEX) 600 MG 12 hr tablet Take 600 mg by  mouth 2 (two) times daily.   Yes [provider]  hydroxychloroquine (PLAQUENIL) 200 MG tablet Take 200 mg by mouth daily.   Yes [provider]  lamoTRIgine (LAMICTAL) 100 MG tablet Take 100 mg by mouth at bedtime.   Yes [provider]  linaclotide (LINZESS) 145 MCG CAPS capsule Take 145 mcg by mouth daily before breakfast.   Yes [provider]  Melatonin 5 MG TABS Take 1 tablet by mouth at bedtime.   Yes [provider]  omeprazole (PRILOSEC) 20 MG capsule Take 1 capsule (20 mg total) by mouth daily. 01/04/19  Yes Sagardia, Eilleen KempfMiguel Jose, MD  ondansetron (ZOFRAN) 4 MG tablet Take 1 tablet (4 mg total) by mouth every 6 (six) hours as needed for nausea. 11/19/18  Yes Alwyn RenMathews, Elizabeth G, MD  oxybutynin (DITROPAN) 5 MG tablet Take 5 mg by mouth 4 (four) times daily.  02/20/16  Yes [provider]  oxyCODONE (ROXICODONE) 5 MG immediate release tablet Take 5 mg by mouth every 4 (four) hours as needed for moderate pain or severe pain. 9 am , 1 pm, 5 pm, 9 pm   Yes [provider]  polyethylene glycol powder (GLYCOLAX/MIRALAX) powder Take 1  1/2 dose  daily in 12 ounces of fluid every day. Patient taking differently: Take 22.5 g by mouth daily.  01/01/17  Yes Esterwood, [name] S, PA-C  QUEtiapine (SEROQUEL) 100 MG tablet Take 1 tablet (100 mg total) by mouth at bedtime. 11/19/18  Yes Alwyn RenMathews, Elizabeth G, MD  rivaroxaban (XARELTO) 10 MG TABS tablet Take 10 mg by mouth daily.   Yes [provider]  tiZANidine (ZANAFLEX) 4 MG capsule Take 1 capsule (4 mg total) by mouth 2 (two) times daily. 12/28/18  Yes Elveria RisingGoodpasture, Tina, NP  topiramate (TOPAMAX) 100 MG tablet Take 1 tablet at bedtime 09/17/18  Yes Goodpasture, Inetta Fermoina, NP  feeding supplement, ENSURE ENLIVE, (ENSURE ENLIVE) LIQD Take 237 mLs by mouth 2 (two) times daily between meals. Patient not taking: Reported on 01/13/2019 05/30/18   Darlin DropHall, Carole N, DO  QUEtiapine (SEROQUEL) 50 MG tablet Take  50 mg by mouth at bedtime. 01/08/19   [provider]  venlafaxine XR (EFFEXOR-XR) 37.5 MG 24 hr capsule Take 37.5 mg by mouth daily. 01/08/19   [provider]    Family History Family History  Problem Relation Age of Onset  . Asthma Father   . Colon cancer Maternal Grandmother        Died in her 4660's  . Breast cancer Paternal Grandmother  Died in her 66's  . Heart attack Maternal Grandfather        Died in his 35's  . Alzheimer's disease Paternal Grandfather        Died in his 82's  . Breast cancer Mother   . Stomach cancer Neg Hx     Social History Social History   Tobacco Use  . Smoking status: Never Smoker  . Smokeless tobacco: Never Used  Substance Use Topics  . Alcohol use: Yes    Alcohol/week: 1.0 standard drinks    Types: 1 Glasses of wine per week    Comment: Drinks alcohol once a month.  . Drug use: No     Allergies   Morphine   Review of Systems Review of Systems  Constitutional: Positive for activity change. Negative for fever.  Gastrointestinal: Positive for abdominal pain and constipation.  Genitourinary: Negative for flank pain.  Allergic/Immunologic: Negative for immunocompromised state.     Physical Exam Updated Vital Signs BP 104/73   Pulse (!) 57   Temp 98.2 F (36.8 C) (Oral)   Resp 17   Ht 4\' 10"  (1.473 m)   Wt 47 kg   LMP 04/05/2011   SpO2 96%   BMI 21.66 kg/m   Physical Exam Vitals signs and nursing note reviewed.  HENT:     Head: Atraumatic.  Neck:     Musculoskeletal: Normal range of motion and neck supple.  Cardiovascular:     Rate and Rhythm: Normal rate.  Pulmonary:     Effort: Pulmonary effort is normal.  Abdominal:     General: There is distension.     Palpations: Abdomen is soft.     Tenderness: There is generalized abdominal tenderness. There is no guarding or rebound.  Skin:    General: Skin is warm.  Neurological:     Mental Status: She is alert and oriented to person, place, and  time.      ED Treatments / Results  Labs (all labs ordered are listed, but only abnormal results are displayed) Labs Reviewed  COMPREHENSIVE METABOLIC PANEL - Abnormal; Notable for the following components:      Result Value   Glucose, Bld 104 (*)    All other components within normal limits  CBC WITH DIFFERENTIAL/PLATELET  LIPASE, BLOOD    EKG None  Radiology Dg Abd Acute 2+v W 1v Chest  Result Date: 01/13/2019 CLINICAL DATA:  Acute generalized abdominal pain, constipation. EXAM: DG ABDOMEN ACUTE W/ 1V CHEST COMPARISON:  None. FINDINGS: Mild amount of stool is noted in the rectum and colon. Mildly dilated air-filled large bowel loops are noted which may represent ileus. Status post cholecystectomy. No definite small bowel dilatation is noted. No radiopaque calculi or other significant radiographic abnormality is seen. Heart size and mediastinal contours are within normal limits. Both lungs are clear. IMPRESSION: Mild stool burden is noted. Mildly dilated air-filled large bowel loops are noted which most likely represent ileus. No acute cardiopulmonary disease. Electronically Signed   By: Lupita Raider, M.D.   On: 01/13/2019 12:26    Procedures Procedures (including critical care time)  Medications Ordered in ED Medications  milk and molasses enema (250 mLs Rectal Given by Other 01/13/19 1617)     Initial Impression / Assessment and Plan / ED Course  I have reviewed the triage vital signs and the nursing notes.  Pertinent labs & imaging results that were available during my care of the patient were reviewed by me and considered in my medical  decision making (see chart for details).     43 year old comes into the ER with chief complaint of abdominal discomfort.  She has history of constipation because of narcotic pain meds.  She also has cerebral palsy and has a PEG tube in place.  On exam patient is noted to have abdominal distention with generalized tenderness.  She does  not have any rebound or guarding tenderness and the tenderness is nonfocal.  She is also complaining of cough.  Acute abdominal series ordered and it shows some air-fluid levels consistent with probably ileus.  Clinically patient does not have small bowel obstruction and she is still passing flatus and has not had any emesis.  We will give her enema.  This x-ray does not reveal any signs of pneumonia. Based on exam and subsequent lab work-up, clinical concerns are extremely low for any intra-abdominal surgical emergency or infectious process.  Final Clinical Impressions(s) / ED Diagnoses   Final diagnoses:  Ileus ([name])  Drug-induced constipation    ED Discharge Orders    None       Derwood Kaplan, MD 01/13/19 1637

## 2019-01-13 NOTE — ED Triage Notes (Signed)
Patient comes from home. Patient is AOx4 and Non-ambulatory. Patient live with at home but has caregiver that lives with patient. Patient called 911 due to having abdominal pain and abdominal cramping. Patient stated pain began yesterday and did attempt Fleet Enema however pain is still present. Urine looks cloudy and EMS noticed a strong productive cough.  Patient is in right mind and can and does make all decisions for self.

## 2019-01-13 NOTE — ED Notes (Signed)
Mac and Cheese provided to patient to eat while waiting in ED for [facility].

## 2019-01-13 NOTE — ED Notes (Signed)
Pt. Stated that  her dad will come and  Take her  home. [facility] request cancelled.  Waiting for her dad.

## 2019-01-14 NOTE — Telephone Encounter (Signed)
Pt was seen in the ER for concerns on 01/13/2019. Was evaluated and released.

## 2019-01-18 NOTE — Telephone Encounter (Signed)
Patient nursing home sent a rep with refill papers. presented to clinic today for refills he had a form that should be sent to the pharmacy.  This was signed with 3 more refills.

## 2019-01-30 ENCOUNTER — Other Ambulatory Visit: Payer: Self-pay

## 2019-01-30 ENCOUNTER — Emergency Department (HOSPITAL_COMMUNITY)
Admission: EM | Admit: 2019-01-30 | Discharge: 2019-01-30 | Disposition: A | Discharge status: Home / Self Care | Payer: Medicare Other | Attending: Emergency Medicine | Admitting: Emergency Medicine

## 2019-01-30 ENCOUNTER — Encounter (HOSPITAL_COMMUNITY): Payer: Self-pay | Admitting: *Deleted

## 2019-01-30 DIAGNOSIS — Y828 Other medical devices associated with adverse incidents: Secondary | ICD-10-CM | POA: Insufficient documentation

## 2019-01-30 DIAGNOSIS — R1030 Lower abdominal pain, unspecified: Secondary | ICD-10-CM | POA: Diagnosis present

## 2019-01-30 DIAGNOSIS — I10 Essential (primary) hypertension: Secondary | ICD-10-CM | POA: Insufficient documentation

## 2019-01-30 DIAGNOSIS — T83090A Other mechanical complication of cystostomy catheter, initial encounter: Secondary | ICD-10-CM

## 2019-01-30 DIAGNOSIS — T83198A Other mechanical complication of other urinary devices and implants, initial encounter: Secondary | ICD-10-CM | POA: Diagnosis not present

## 2019-01-30 DIAGNOSIS — Z79899 Other long term (current) drug therapy: Secondary | ICD-10-CM | POA: Insufficient documentation

## 2019-01-30 NOTE — ED Notes (Signed)
Bed: [facility] Expected date:  Expected time:  Means of arrival:  Comments: 

## 2019-01-30 NOTE — ED Notes (Signed)
[facility] has been called and will be in route to transport patient ASAP. 

## 2019-01-30 NOTE — ED Provider Notes (Signed)
[facility]-EMERGENCY DEPT Provider Note   CSN: [id number] Arrival date & time: 01/30/19  1125    History   Chief Complaint Chief Complaint  Patient presents with  . Urinary Cath Leaking    HPI [name] [name] is a 43 y.o. female.     HPI Patient has had urinary leakage around her suprapubic catheter for the last couple days.  States that had decreased urine in her bag today.  Mild lower abdominal pain.  No fevers.  History of cerebral palsy.  No fevers.  Does not feel as if she has a urinary tract infection. Past Medical History:  Diagnosis Date  . Acute respiratory failure with hypoxia ([name])   . Anemia   . Cellulitis 12/22/2014  . Cerebral palsy ([name])    Spastic Cerebral palsy, mentally intact  . Contracture, joint, multiple sites    Electric wheelchair, uses left hand to operate chair.   . Depression   . [name]   . Dysarthria   . GERD (gastroesophageal reflux disease)   . Gram positive sepsis ([name]) 10/24/2014  . HCAP (healthcare-associated pneumonia)    2015, 2018  . History of endometriosis 10/2012   OBGYN [facility]: Laparoscopic Endometrial Ablation, TV[facility],  . History of recurrent UTIs   . Hypertension   . Incontinent of feces 03/11/2016  . Intracervical pessary 05/07/2011   Overview:  Overview:  Placed by [facility] GYN 04/30/11 to treat DUB and Endometriosis.  She is seen at GYN clinic at [facility] but was considered a poor surgical candidate and referred her to [facility] [place].  For DUB she had pelvic ultrasound that showed thin stripe and she had endometrial Bx by Dr Jennette Kettle in GYN clinic, which was negative.     . Macroscopic hematuria 03/10/2013   status post replacement of suprapubic tube 03/04/2013   . Migraine   . OCD (obsessive compulsive disorder)   . Parapneumonic effusion 10/25/2014  . Presence of intrathecal baclofen pump 03/07/2014   R LQ. Insertion T10.    [name] Kitchen Psychiatric illness 10/31/2014  . Recurrent pulmonary embolism ([name])    Pt has history of recurrent PE and is on lifelong coumadin.   . Seizures ([name]) 02/12/2016  . Spasticity 01/05/2014  . Transient alteration of awareness, recurrent 09/08/2006   Recurrent episodes where Remell  loses contact with her world and that have been studied thoroughly and appear nonepileptic in nature per Dr Terrace Arabia (neurology) The patient has had episodes starting in 2007 that initially began 1-4 times a day during which time she would have periods of unawareness followed by confusion. This continuous video EEG monitoring performed from October 8-12, 2007. Had   . Urethra dilated and patulous 07/03/2015   Patulous, Dilated Urethra. 5mL balloon on a 22-Fr catheter hoping this would prevent the bladder spasm from pushing the balloon down the urethra (Dr Logan Bores, Urology [facility], July 2016)     Patient Active Problem List   Diagnosis Date Noted  . Cough 01/04/2019  . Bad taste in mouth 01/04/2019  . Hypotension 11/19/2018  . Complicated UTI (urinary tract infection) 11/18/2018  . Solitary pulmonary nodule on lung CT 10/28/2018  . Connective tissue disorder ([name]) 08/07/2018  . Migraine without aura and without status migrainosus, not intractable 06/26/2018  . UTI (urinary tract infection) 05/25/2018  . Headache 05/25/2018  . Incontinent of feces 03/11/2016  . Chronic anticoagulation 03/11/2016  . Involuntary movements 02/22/2016  . Seizures ([name]) 02/12/2016  . Dyspnea 02/12/2016  . Cerebral palsy, quadriplegic (  [name]) 02/06/2016  . Swelling of extremity 01/13/2016  . Nausea without vomiting 11/09/2015  . Fever   . Pressure ulcer 10/09/2015  . Status post insertion of inferior vena caval filter 09/28/2015  . Absence of bladder continence 08/25/2015  . Person living in residential institution 06/14/2015  . Pulmonary hypertension ([name])   . Anemia of chronic disease   . Spasticity 03/15/2015  . Constipation, chronic 10/10/2014  . Athetoid cerebral palsy ([name]) 04/13/2014  . Dystonia  04/12/2014  . UTI (lower urinary tract infection) 03/16/2014  . Presence of intrathecal baclofen pump 03/07/2014  . Dental caries 07/18/2013  . Multiple thyroid nodules 05/20/2013  . Osteoarthrosis 04/07/2013  . [name]   . Dysarthria   . GERD (gastroesophageal reflux disease)   . Neurogenic bladder 02/05/2013  . Anxiety state 10/21/2012  . Chronic pain syndrome 06/17/2012  . Recurrent pulmonary embolism ([name]) 05/03/2011  . Pulmonary embolism with infarction ([name]) 05/03/2011  . Chronic suprapubic catheter ([name]) 03/15/2011  . Disease of female genital organs 10/24/2010  . HYPERTENSION, BENIGN 01/31/2010  . CP (cerebral palsy), spastic ([name]) 12/27/2008  . Contracted, joint, multiple sites 12/27/2008  . Major depressive disorder, recurrent episode ([name]) 01/29/2007  . Obsessive Compulsive Disorder 01/29/2007  . Transient alteration of awareness, recurrent 09/08/2006    Past Surgical History:  Procedure Laterality Date  . ABDOMINAL HYSTERECTOMY    . APPENDECTOMY    . BACLOFEN PUMP REFILL     x 3 times  . CARPAL TUNNEL RELEASE  08/2008   Dr Teressa Senter  . CESAREAN SECTION     x 2  . CHOLECYSTECTOMY  10/14/2015   Procedure: LAPAROSCOPIC CHOLECYSTECTOMY;  Surgeon: Violeta Gelinas, MD;  Location: [facility] OR;  Service: General;;  . COLPOSCOPY  06/2000  . INTRAUTERINE DEVICE INSERTION  04/30/11   Inserted by [facility]YN for endometriosis  . LAPAROSCOPIC ASSISTED VAGINAL HYSTERECTOMY  10/27/2012  . MULTIPLE EXTRACTIONS WITH ALVEOLOPLASTY Bilateral 07/19/2013   Procedure: EXTRACTIONS #4, 1,61,09,60;  Surgeon: Francene Finders, DDS;  Location: [facility] OR;  Service: Oral Surgery;  Laterality: Bilateral;  . NEUROMA SURGERY Left    anterior and posterior intersosseous  . PAIN PUMP IMPLANTATION N/A 03/07/2014   Procedure: baclofen pump revision/replacement and Catheter connection replacement;  Surgeon: Cristi Loron, MD;  Location: [facility];  Service: Neurosurgery;   Laterality: N/A;  baclofen pump revision/replacement and Catheter connection replacement  . PROGRAMABLE BACLOFEN PUMP REVISION  03/17/14   Battery Replacement   . TUBAL LIGATION  2003  . URETHRA SURGERY    . WRIST SURGERY  06/2010   Dr Dierdre Searles, hand surgeon, [name]     OB History   No obstetric history on file.      Home Medications    Prior to Admission medications   Medication Sig Start Date End Date Taking? Authorizing Provider  acetaminophen (TYLENOL 8 HOUR) 650 MG CR tablet Take 1 tablet (650 mg total) by mouth every 8 (eight) hours as needed. Patient taking differently: Take 1,300 mg by mouth 2 (two) times daily as needed for pain.  07/08/18   Derwood Kaplan, MD  amantadine (SYMMETREL) 100 MG capsule Take 100 mg by mouth daily.     [provider]  azelastine (ASTELIN) 0.1 % nasal spray Place 2 sprays into both nostrils 2 (two) times daily. Use in each nostril as directed    [provider]  baclofen (LIORESAL) 20 MG tablet Take 1 tablet (20 mg total) by mouth 4 (four) times daily.  For bladder spasms 12/28/18   Elveria Rising, NP  bisacodyl (DULCOLAX) 10 MG suppository Place 10 mg rectally 2 (two) times daily as needed for moderate constipation.     [provider]  Buprenorphine HCl (BELBUCA) 450 MCG FILM Place 450 mcg inside cheek every 12 (twelve) hours. 08/22/18   Amin, Loura Halt, MD  busPIRone (BUSPAR) 5 MG tablet Take 5 mg by mouth 3 (three) times daily.    [provider]  clonazePAM (KLONOPIN) 0.5 MG tablet Take 1.5 tablets (0.75 mg total) by mouth 3 (three) times daily. 11/27/17   Narda Bonds, MD  feeding supplement, ENSURE ENLIVE, (ENSURE ENLIVE) LIQD Take 237 mLs by mouth 2 (two) times daily between meals. Patient not taking: Reported on 01/13/2019 05/30/18   Darlin Drop, DO  FLUoxetine (PROZAC) 40 MG capsule Take 80 mg by mouth daily.    [provider]  guaiFENesin (MUCINEX) 600 MG 12 hr tablet Take 600 mg by mouth 2  (two) times daily.    [provider]  hydroxychloroquine (PLAQUENIL) 200 MG tablet Take 200 mg by mouth daily.    [provider]  lamoTRIgine (LAMICTAL) 100 MG tablet Take 100 mg by mouth at bedtime.    [provider]  linaclotide (LINZESS) 145 MCG CAPS capsule Take 145 mcg by mouth daily before breakfast.    [provider]  Melatonin 5 MG TABS Take 1 tablet by mouth at bedtime.    [provider]  omeprazole (PRILOSEC) 20 MG capsule Take 1 capsule (20 mg total) by mouth daily. 01/04/19   Georgina Quint, MD  ondansetron (ZOFRAN) 4 MG tablet Take 1 tablet (4 mg total) by mouth every 6 (six) hours as needed for nausea. 11/19/18   Alwyn Ren, MD  oxybutynin (DITROPAN) 5 MG tablet Take 5 mg by mouth 4 (four) times daily.  02/20/16   [provider]  oxyCODONE (ROXICODONE) 5 MG immediate release tablet Take 5 mg by mouth every 4 (four) hours as needed for moderate pain or severe pain. 9 am , 1 pm, 5 pm, 9 pm    [provider]  polyethylene glycol powder (GLYCOLAX/MIRALAX) powder Take 1  1/2 dose  daily in 12 ounces of fluid every day. Patient taking differently: Take 22.5 g by mouth daily.  01/01/17   Esterwood, [name] S, PA-C  QUEtiapine (SEROQUEL) 100 MG tablet Take 1 tablet (100 mg total) by mouth at bedtime. 11/19/18   Alwyn Ren, MD  QUEtiapine (SEROQUEL) 50 MG tablet Take 50 mg by mouth at bedtime. 01/08/19   [provider]  rivaroxaban (XARELTO) 10 MG TABS tablet Take 10 mg by mouth daily.    [provider]  tiZANidine (ZANAFLEX) 4 MG capsule Take 1 capsule (4 mg total) by mouth 2 (two) times daily. 12/28/18   Elveria Rising, NP  topiramate (TOPAMAX) 100 MG tablet Take 1 tablet at bedtime 09/17/18   Elveria Rising, NP  venlafaxine XR (EFFEXOR-XR) 37.5 MG 24 hr capsule Take 37.5 mg by mouth daily. 01/08/19   [provider]    Family History Family History  Problem Relation Age  of Onset  . Asthma Father   . Colon cancer Maternal Grandmother        Died in her 11's  . Breast cancer Paternal Grandmother        Died in her 41's  . Heart attack Maternal Grandfather        Died in his 26's  . Alzheimer's  disease Paternal Grandfather        Died in his 78's  . Breast cancer Mother   . Stomach cancer Neg Hx     Social History Social History   Tobacco Use  . Smoking status: Never Smoker  . Smokeless tobacco: Never Used  Substance Use Topics  . Alcohol use: Yes    Alcohol/week: 1.0 standard drinks    Types: 1 Glasses of wine per week    Comment: Drinks alcohol once a month.  . Drug use: No     Allergies   Morphine   Review of Systems Review of Systems  Constitutional: Negative for appetite change.  HENT: Negative for congestion.   Cardiovascular: Negative for chest pain.  Gastrointestinal: Negative for abdominal pain.  Genitourinary:       Decreased urinary output.  Musculoskeletal: Negative for back pain.  Skin: Negative for rash.     Physical Exam Updated Vital Signs BP 119/86 (BP Location: Left Arm)   Pulse 87   Temp 97.8 F (36.6 C) (Oral)   Resp 16   Ht 4\' 10"  (1.473 m)   Wt 47 kg   LMP 04/05/2011   SpO2 100%   BMI 21.66 kg/m   Physical Exam Vitals signs and nursing note reviewed.  HENT:     Mouth/Throat:     Mouth: Mucous membranes are moist.  Cardiovascular:     Rate and Rhythm: Normal rate and regular rhythm.  Abdominal:     Comments: Mild suprapubic fullness.  Skin:    General: Skin is warm.     Capillary Refill: Capillary refill takes less than 2 seconds.  Neurological:     Mental Status: She is alert.     Comments: Patient contracted at baseline.  Psychiatric:        Mood and Affect: Mood normal.      ED Treatments / Results  Labs (all labs ordered are listed, but only abnormal results are displayed) Labs Reviewed - No data to display  EKG None  Radiology No results found.  Procedures SUPRAPUBIC  TUBE PLACEMENT Date/Time: 01/30/2019 11:56 AM Performed by: Benjiman Core, MD Authorized by: Benjiman Core, MD   Consent:    Consent obtained:  Verbal   Consent given by:  Patient Anesthesia (see MAR for exact dosages):    Anesthesia method:  None Procedure details:    Complexity:  Simple   Catheter type:  Foley   Catheter size:  20 Fr   Ultrasound guidance: no     Number of attempts:  1   Urine characteristics:  Cloudy Post-procedure details:    Patient tolerance of procedure:  Tolerated well, no immediate complications   (including critical care time)  Medications Ordered in ED Medications - No data to display   Initial Impression / Assessment and Plan / ED Course  I have reviewed the triage vital signs and the nursing notes.  Pertinent labs & imaging results that were available during my care of the patient were reviewed by me and considered in my medical decision making (see chart for details).        Patient with chronic suprapubic catheter.  Obstruction began yesterday.  Foley catheter replaced by me.  Feels better.  Discharge home.  No fevers.  Does not feel she has an infection.  Will not check urine.  Final Clinical Impressions(s) / ED Diagnoses   Final diagnoses:  Complication, suprapubic catheter obstruction, initial encounter [facility])    ED Discharge Orders    None  Benjiman CorePickering, Jaheim Canino, MD 01/30/19 1250

## 2019-01-30 NOTE — ED Triage Notes (Signed)
EMS states pt is from home, she has noted her urinary cath to be leaking, x 2 days. Pt notified urology and did not get a call back. 104/64-84-98% RA -18

## 2019-01-30 NOTE — Discharge Instructions (Addendum)
Follow-up with your urologist as needed. °

## 2019-01-30 NOTE — ED Notes (Signed)
Temp 97.8

## 2019-02-05 ENCOUNTER — Emergency Department (HOSPITAL_COMMUNITY)
Admission: EM | Admit: 2019-02-05 | Discharge: 2019-02-05 | Disposition: A | Discharge status: Home / Self Care | Payer: Medicare Other | Attending: Emergency Medicine | Admitting: Emergency Medicine

## 2019-02-05 ENCOUNTER — Other Ambulatory Visit: Payer: Self-pay

## 2019-02-05 ENCOUNTER — Encounter (HOSPITAL_COMMUNITY): Payer: Self-pay | Admitting: Emergency Medicine

## 2019-02-05 DIAGNOSIS — Y69 Unspecified misadventure during surgical and medical care: Secondary | ICD-10-CM | POA: Insufficient documentation

## 2019-02-05 DIAGNOSIS — T83010S Breakdown (mechanical) of cystostomy catheter, sequela: Secondary | ICD-10-CM

## 2019-02-05 DIAGNOSIS — I10 Essential (primary) hypertension: Secondary | ICD-10-CM | POA: Diagnosis not present

## 2019-02-05 DIAGNOSIS — Z79899 Other long term (current) drug therapy: Secondary | ICD-10-CM | POA: Diagnosis not present

## 2019-02-05 DIAGNOSIS — T83198A Other mechanical complication of other urinary devices and implants, initial encounter: Secondary | ICD-10-CM | POA: Insufficient documentation

## 2019-02-05 MED ORDER — CARRINGTON MOISTURE BARRIER EX CREA: TOPICAL_CREAM | CUTANEOUS | 0 refills | Status: AC | PRN

## 2019-02-05 NOTE — ED Notes (Signed)
Patient is alert and oriented, denies chest pain, pain at catheter site or any other complaints other than leaking catheter.

## 2019-02-05 NOTE — ED Triage Notes (Signed)
Arrived from home via EMS. Noted urinary cath to be leaking x1 day, called urologist and set up appointment for the 26th.   Vitals -BP 96/58 -P 84 -RR 18

## 2019-02-05 NOTE — Discharge Instructions (Addendum)
You have been seen today for leaking catheter. Please read and follow all provided instructions. Return to the emergency room for worsening condition or new concerning symptoms.    1. Medications:  A prescription was sent to your pharmacy for Eucerin cream.  This can be used as a barrier cream/skin protectant for the redness around the catheter. Continue usual home medications  2. Treatment: rest, drink plenty of fluids.  You can keep gauze or some kind of absorbent material around the catheter to help absorbed the leaking until you can be seen by the urologist. 3. Follow Up: It is important that you follow-up with your urologist for further evaluation.  Call first thing Monday morning and schedule the next available appointment.  Please follow up with your primary doctor in 2-5 days for discussion of your diagnoses and further evaluation after today's visit if needed  ?

## 2019-02-05 NOTE — ED Notes (Signed)
Bed: [facility] Expected date:  Expected time:  Means of arrival:  Comments: Hold Room 3

## 2019-02-05 NOTE — ED Notes (Signed)
Bed: [facility]03 Expected date:  Expected time:  Means of arrival:  Comments: EMS-leaky foley cath

## 2019-02-05 NOTE — ED Provider Notes (Signed)
[facility]-EMERGENCY DEPT Provider Note   CSN: [id number] Arrival date & time: 02/05/19  1658    History   Chief Complaint Chief Complaint  Patient presents with  . leaking suprapubic    HPI [name] [name] is a 43 y.o. female with medical history significant for CP wheelchair bound with chronic indwelling suprapubic catheter presenting to emergency department today with chief complaint of urinary leakage around suprapubic catheter x2 days.    Patient states last night she had minimal and of urine leaking from around the catheter however it seemed to improve by the time she woke up this morning.  Later in the day she noticed the urine was leaking again.  She denies abdominal pain and does not think she has a current urinary tract infection.  Denies fevers, nausea, vomiting, abdominal pain, back pain.  Patient called her urologist today and has an appointment scheduled for the 26th. Patient was here 1 week ago with similar complaints and had her catheter replaced with a 20 [place] Foley. History provided by patient.      Past Medical History:  Diagnosis Date  . Acute respiratory failure with hypoxia ([name])   . Anemia   . Cellulitis 12/22/2014  . Cerebral palsy ([name])    Spastic Cerebral palsy, mentally intact  . Contracture, joint, multiple sites    Electric wheelchair, uses left hand to operate chair.   . Depression   . [name]   . Dysarthria   . GERD (gastroesophageal reflux disease)   . Gram positive sepsis ([name]) 10/24/2014  . HCAP (healthcare-associated pneumonia)    2015, 2018  . History of endometriosis 10/2012   OBGYN [facility]: Laparoscopic Endometrial Ablation, TVH,  . History of recurrent UTIs   . Hypertension   . Incontinent of feces 03/11/2016  . Intracervical pessary 05/07/2011   Overview:  Overview:  Placed by [facility] 04/30/11 to treat DUB and Endometriosis.  She is seen at GYN clinic at [facility] but was considered  a poor surgical candidate and referred her to [facility].  For DUB she had pelvic ultrasound that showed thin stripe and she had endometrial Bx by Dr Jennette Kettle in GYN clinic, which was negative.     . Macroscopic hematuria 03/10/2013   status post replacement of suprapubic tube 03/04/2013   . Migraine   . OCD (obsessive compulsive disorder)   . Parapneumonic effusion 10/25/2014  . Presence of intrathecal baclofen pump 03/07/2014   R LQ. Insertion T10.    [name] Kitchen Psychiatric illness 10/31/2014  . Recurrent pulmonary embolism ([name])    Pt has history of recurrent PE and is on lifelong coumadin.   . Seizures ([name]) 02/12/2016  . Spasticity 01/05/2014  . Transient alteration of awareness, recurrent 09/08/2006   Recurrent episodes where [name]  loses contact with her world and that have been studied thoroughly and appear nonepileptic in nature per Dr Terrace Arabia (neurology) The patient has had episodes starting in 2007 that initially began 1-4 times a day during which time she would have periods of unawareness followed by confusion. This continuous video EEG monitoring performed from October 8-12, 2007. Had   . Urethra dilated and patulous 07/03/2015   Patulous, Dilated Urethra. 5mL balloon on a 22-Fr catheter hoping this would prevent the bladder spasm from pushing the balloon down the urethra (Dr Logan Bores, Urology [facility], July 2016)     Patient Active Problem List   Diagnosis Date Noted  . Cough 01/04/2019  . Bad taste in mouth  01/04/2019  . Hypotension 11/19/2018  . Complicated UTI (urinary tract infection) 11/18/2018  . Solitary pulmonary nodule on lung CT 10/28/2018  . Connective tissue disorder ([name]) 08/07/2018  . Migraine without aura and without status migrainosus, not intractable 06/26/2018  . UTI (urinary tract infection) 05/25/2018  . Headache 05/25/2018  . Incontinent of feces 03/11/2016  . Chronic anticoagulation 03/11/2016  . Involuntary movements 02/22/2016  . Seizures ([name]) 02/12/2016  . Dyspnea 02/12/2016  .  Cerebral palsy, quadriplegic ([name]) 02/06/2016  . Swelling of extremity 01/13/2016  . Nausea without vomiting 11/09/2015  . Fever   . Pressure ulcer 10/09/2015  . Status post insertion of inferior vena caval filter 09/28/2015  . Absence of bladder continence 08/25/2015  . Person living in residential institution 06/14/2015  . Pulmonary hypertension ([name])   . Anemia of chronic disease   . Spasticity 03/15/2015  . Constipation, chronic 10/10/2014  . Athetoid cerebral palsy ([name]) 04/13/2014  . Dystonia 04/12/2014  . UTI (lower urinary tract infection) 03/16/2014  . Presence of intrathecal baclofen pump 03/07/2014  . Dental caries 07/18/2013  . Multiple thyroid nodules 05/20/2013  . Osteoarthrosis 04/07/2013  . [name]   . Dysarthria   . GERD (gastroesophageal reflux disease)   . Neurogenic bladder 02/05/2013  . Anxiety state 10/21/2012  . Chronic pain syndrome 06/17/2012  . Recurrent pulmonary embolism ([name]) 05/03/2011  . Pulmonary embolism with infarction ([name]) 05/03/2011  . Chronic suprapubic catheter ([name]) 03/15/2011  . Disease of female genital organs 10/24/2010  . HYPERTENSION, BENIGN 01/31/2010  . CP (cerebral palsy), spastic ([name]) 12/27/2008  . Contracted, joint, multiple sites 12/27/2008  . Major depressive disorder, recurrent episode ([name]) 01/29/2007  . Obsessive Compulsive Disorder 01/29/2007  . Transient alteration of awareness, recurrent 09/08/2006    Past Surgical History:  Procedure Laterality Date  . ABDOMINAL HYSTERECTOMY    . APPENDECTOMY    . BACLOFEN PUMP REFILL     x 3 times  . CARPAL TUNNEL RELEASE  08/2008   Dr Teressa Senter  . CESAREAN SECTION     x 2  . CHOLECYSTECTOMY  10/14/2015   Procedure: LAPAROSCOPIC CHOLECYSTECTOMY;  Surgeon: Violeta Gelinas, MD;  Location: [facility] OR;  Service: General;;  . COLPOSCOPY  06/2000  . INTRAUTERINE DEVICE INSERTION  04/30/11   Inserted by [facility] for endometriosis  . LAPAROSCOPIC ASSISTED  VAGINAL HYSTERECTOMY  10/27/2012  . MULTIPLE EXTRACTIONS WITH ALVEOLOPLASTY Bilateral 07/19/2013   Procedure: EXTRACTIONS #4, 8,56,31,49;  Surgeon: Francene Finders, DDS;  Location: [facility] OR;  Service: Oral Surgery;  Laterality: Bilateral;  . NEUROMA SURGERY Left    anterior and posterior intersosseous  . PAIN PUMP IMPLANTATION N/A 03/07/2014   Procedure: baclofen pump revision/replacement and Catheter connection replacement;  Surgeon: Cristi Loron, MD;  Location: [facility];  Service: Neurosurgery;  Laterality: N/A;  baclofen pump revision/replacement and Catheter connection replacement  . PROGRAMABLE BACLOFEN PUMP REVISION  03/17/14   Battery Replacement   . TUBAL LIGATION  2003  . URETHRA SURGERY    . WRIST SURGERY  06/2010   Dr Dierdre Searles, hand surgeon, [name]     OB History   No obstetric history on file.      Home Medications    Prior to Admission medications   Medication Sig Start Date End Date Taking? Authorizing Provider  acetaminophen (TYLENOL 8 HOUR) 650 MG CR tablet Take 1 tablet (650 mg total) by mouth every 8 (eight) hours as needed. Patient taking differently: Take 1,300 mg by  mouth 2 (two) times daily as needed for pain.  07/08/18  Yes Derwood Kaplan, MD  amantadine (SYMMETREL) 100 MG capsule Take 100 mg by mouth daily.    Yes [provider]  azelastine (ASTELIN) 0.1 % nasal spray Place 2 sprays into both nostrils 2 (two) times daily. Use in each nostril as directed   Yes [provider]  baclofen (LIORESAL) 20 MG tablet Take 1 tablet (20 mg total) by mouth 4 (four) times daily. For bladder spasms 12/28/18  Yes Elveria Rising, NP  Buprenorphine HCl (BELBUCA) 450 MCG FILM Place 450 mcg inside cheek every 12 (twelve) hours. 08/22/18  Yes Amin, Ankit Chirag, MD  busPIRone (BUSPAR) 5 MG tablet Take 5 mg by mouth 3 (three) times daily.   Yes [provider]  clonazePAM (KLONOPIN) 0.5 MG tablet Take 1.5 tablets (0.75 mg total) by mouth 3 (three) times  daily. 11/27/17  Yes Narda Bonds, MD  FLUoxetine (PROZAC) 40 MG capsule Take 80 mg by mouth daily.   Yes [provider]  guaiFENesin (MUCINEX) 600 MG 12 hr tablet Take 600 mg by mouth 2 (two) times daily.   Yes [provider]  hydroxychloroquine (PLAQUENIL) 200 MG tablet Take 200 mg by mouth daily.   Yes [provider]  lamoTRIgine (LAMICTAL) 100 MG tablet Take 100 mg by mouth at bedtime.   Yes [provider]  Melatonin 5 MG TABS Take 1 tablet by mouth at bedtime.   Yes [provider]  omeprazole (PRILOSEC) 20 MG capsule Take 1 capsule (20 mg total) by mouth daily. 01/04/19  Yes Sagardia, Eilleen Kempf, MD  ondansetron (ZOFRAN) 4 MG tablet Take 1 tablet (4 mg total) by mouth every 6 (six) hours as needed for nausea. 11/19/18  Yes Alwyn Ren, MD  oxybutynin (DITROPAN) 5 MG tablet Take 5 mg by mouth 4 (four) times daily.  02/20/16  Yes [provider]  oxyCODONE (ROXICODONE) 5 MG immediate release tablet Take 5 mg by mouth every 4 (four) hours as needed for moderate pain or severe pain. 9 am , 1 pm, 5 pm, 9 pm   Yes [provider]  polyethylene glycol powder (GLYCOLAX/MIRALAX) powder Take 1  1/2 dose  daily in 12 ounces of fluid every day. Patient taking differently: Take 22.5 g by mouth daily.  01/01/17  Yes Esterwood, [name] S, PA-C  QUEtiapine (SEROQUEL) 100 MG tablet Take 1 tablet (100 mg total) by mouth at bedtime. 11/19/18  Yes Alwyn Ren, MD  QUEtiapine (SEROQUEL) 50 MG tablet Take 50 mg by mouth at bedtime. 01/08/19  Yes [provider]  rivaroxaban (XARELTO) 10 MG TABS tablet Take 10 mg by mouth daily.   Yes [provider]  tiZANidine (ZANAFLEX) 4 MG capsule Take 1 capsule (4 mg total) by mouth 2 (two) times daily. 12/28/18  Yes Elveria Rising, NP  topiramate (TOPAMAX) 100 MG tablet Take 1 tablet at bedtime 09/17/18  Yes Elveria Rising, NP  venlafaxine XR (EFFEXOR-XR) 37.5 MG 24 hr  capsule Take 37.5 mg by mouth daily. 01/08/19  Yes [provider]  feeding supplement, ENSURE ENLIVE, (ENSURE ENLIVE) LIQD Take 237 mLs by mouth 2 (two) times daily between meals. Patient not taking: Reported on 01/13/2019 05/30/18   Darlin Drop, DO  linaclotide [facility]) 145 MCG CAPS capsule Take 145 mcg by mouth daily before breakfast.    [provider]  Skin Protectants, Misc. (EUCERIN) cream Apply topically as needed for wound care. 02/05/19  Dorleen Kissel, Caroleen HammanKaitlyn E, PA-C    Family History Family History  Problem Relation Age of Onset  . Asthma Father   . Colon cancer Maternal Grandmother        Died in her 6160's  . Breast cancer Paternal Grandmother        Died in her 6570's  . Heart attack Maternal Grandfather        Died in his 570's  . Alzheimer's disease Paternal Grandfather        Died in his 6080's  . Breast cancer Mother   . Stomach cancer Neg Hx     Social History Social History   Tobacco Use  . Smoking status: Never Smoker  . Smokeless tobacco: Never Used  Substance Use Topics  . Alcohol use: Yes    Alcohol/week: 1.0 standard drinks    Types: 1 Glasses of wine per week    Comment: Drinks alcohol once a month.  . Drug use: No     Allergies   Morphine   Review of Systems Review of Systems  Gastrointestinal: Negative for abdominal pain.  Genitourinary: Negative for dysuria, flank pain, hematuria and pelvic pain. Vaginal discharge:    All other systems reviewed and are negative.    Physical Exam Updated Vital Signs BP 122/88 (BP Location: Left Arm)   Pulse 77   Temp 98 F (36.7 C) (Oral)   Resp 15   LMP 04/05/2011   SpO2 98%   Physical Exam Vitals signs and nursing note reviewed.  Constitutional:      General: She is not in acute distress.    Comments: Patient with physical deformities consistent with CP.  HENT:     Head: Normocephalic and atraumatic.     Right Ear: External ear normal.     Left Ear: External ear normal.      Nose: Nose normal.     Mouth/Throat:     Mouth: Mucous membranes are moist.     Pharynx: Oropharynx is clear.  Eyes:     Extraocular Movements: Extraocular movements intact.     Conjunctiva/sclera: Conjunctivae normal.     Pupils: Pupils are equal, round, and reactive to light.  Cardiovascular:     Rate and Rhythm: Normal rate and regular rhythm.  Pulmonary:     Effort: Pulmonary effort is normal.     Breath sounds: Normal breath sounds.  Abdominal:     General: Bowel sounds are normal. There is no distension.     Palpations: Abdomen is soft.     Tenderness: There is no abdominal tenderness. There is no guarding.     Comments: Suprapubic catheter in place with mild erythema and no purulent drainage.  The area is moist, unable to tell if it is urine that is leaking.  Musculoskeletal:     Comments: Patient with chronic deformities and contractures of wrists and legs.  Skin:    General: Skin is warm.  Neurological:     Mental Status: She is alert and oriented to person, place, and time.     Comments: Pt is difficult to understand speech.  Psychiatric:        Behavior: Behavior normal.      ED Treatments / Results  Labs (all labs ordered are listed, but only abnormal results are displayed) Labs Reviewed - No data to display  EKG None  Radiology No results found.  Procedures Procedures (including critical care time)  Medications Ordered in ED Medications - No data to display   Initial  Impression / Assessment and Plan / ED Course  I have reviewed the triage vital signs and the nursing notes.  Pertinent labs & imaging results that were available during my care of the patient were reviewed by me and considered in my medical decision making (see chart for details).    Patient is well-appearing, afebrile.  She presents with urinary drainage at site her suprapubic catheter.  Patient was seen here 1 week ago with similar complaint and the catheter was replaced.  Patient  states today she noticed increased drainage.  On exam there is clear drainage from the catheter however I am unable to tell if it is urine or not. There is no odor or signs of infection at catheter site.  She has mild erythema which she states is always there.  When I clean the area the drainage was clear, I am not convinced that this is urine. The urine in the Foley bag is yellow without purulence.  Patient has had the catheter replaced multiple times.  Recommend she call urologist for follow-up to see if she can be seen sooner than her scheduled appointment at the end of the month.   Patient is hemodynamically stable, in NAD. Evaluation does not show pathology that would require ongoing emergent intervention or inpatient treatment. I explained the diagnosis to the patient.  Patient is comfortable with above plan and is stable for discharge at this time. All questions were answered prior to disposition. Strict return precautions for returning to the ED were discussed. Encouraged follow up with PCP/urologist. Pt case discussed with Dr. Rhunette Croft who agrees with my plan.   This note was prepared with assistance of Conservation officer, historic buildings. Occasional wrong-word or sound-a-like substitutions may have occurred due to the inherent limitations of voice recognition software.        Final Clinical Impressions(s) / ED Diagnoses   Final diagnoses:  Suprapubic catheter dysfunction, sequela    ED Discharge Orders         Ordered    Skin Protectants, Misc. (EUCERIN) cream  As needed     02/05/19 1910           Kathyrn Lass 02/05/19 2345    Derwood Kaplan, MD 02/06/19 [phone]

## 2019-02-05 NOTE — ED Notes (Signed)
[facility] called for transport.  

## 2019-02-05 NOTE — ED Notes (Signed)
Patient given clean pants and a new brief and linens applied before moving to hallway.

## 2019-02-05 NOTE — ED Notes (Signed)
Patient's dad came and picked her up, RN and NT assisted with putting patient in car.

## 2019-02-10 ENCOUNTER — Ambulatory Visit (INDEPENDENT_AMBULATORY_CARE_PROVIDER_SITE_OTHER): Payer: Medicare Other | Admitting: Family

## 2019-02-10 ENCOUNTER — Other Ambulatory Visit: Payer: Self-pay

## 2019-02-10 ENCOUNTER — Encounter (INDEPENDENT_AMBULATORY_CARE_PROVIDER_SITE_OTHER): Payer: Self-pay | Admitting: Family

## 2019-02-10 VITALS — BP 98/64 | HR 68

## 2019-02-10 DIAGNOSIS — Z978 Presence of other specified devices: Secondary | ICD-10-CM

## 2019-02-10 DIAGNOSIS — G808 Other cerebral palsy: Secondary | ICD-10-CM

## 2019-02-10 DIAGNOSIS — G43009 Migraine without aura, not intractable, without status migrainosus: Secondary | ICD-10-CM | POA: Diagnosis not present

## 2019-02-10 DIAGNOSIS — M2450 Contracture, unspecified joint: Secondary | ICD-10-CM

## 2019-02-10 DIAGNOSIS — G803 Athetoid cerebral palsy: Secondary | ICD-10-CM

## 2019-02-10 DIAGNOSIS — G801 Spastic diplegic cerebral palsy: Secondary | ICD-10-CM | POA: Diagnosis not present

## 2019-02-10 DIAGNOSIS — R471 Dysarthria and anarthria: Secondary | ICD-10-CM

## 2019-02-10 DIAGNOSIS — G894 Chronic pain syndrome: Secondary | ICD-10-CM

## 2019-02-10 DIAGNOSIS — G249 Dystonia, unspecified: Secondary | ICD-10-CM

## 2019-02-10 DIAGNOSIS — K029 Dental caries, unspecified: Secondary | ICD-10-CM

## 2019-02-10 DIAGNOSIS — F428 Other obsessive-compulsive disorder: Secondary | ICD-10-CM

## 2019-02-10 DIAGNOSIS — F330 Major depressive disorder, recurrent, mild: Secondary | ICD-10-CM

## 2019-02-10 DIAGNOSIS — R252 Cramp and spasm: Secondary | ICD-10-CM

## 2019-02-10 MED ORDER — TOPIRAMATE 100 MG PO TABS: ORAL_TABLET | ORAL | 5 refills | Status: AC

## 2019-02-10 NOTE — Progress Notes (Signed)
Patient: [name] [name] MRN: [id number] Sex: female DOB: 10/05/1976  Provider: Elveria Rising, NP Location of Care: [facility]  Note type: Routine return visit  History of Present Illness: Referral Source: Melanie March, MD History from: patient and [facility] chart Chief Complaint: Muscle Spasms  [name] [name] is a 43 y.o. 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 She has an implanted intrathecal pump to deliver Baclofen that is managed by Dr Sharene Skeans. She was last seen November 18, 2018 by Dr Sharene Skeans and was admitted to the hospital on the same day for urinary tract infection and sepsis. [name] [name] is taking and tolerating Topiramate for migraine prevention and tells me today that the headaches have been worse since her mother passed away recently. She also complains of muscle spasms "all over the body" being worse. She admits to being more depressed because her mother's passing. [name] [name] says that she sees a therapist for her mood every 2 weeks. Since her last visit, [name] [name] has moved out of a nursing facility and has moved into an apartment with a woman and a 85 year old child. She says that the roommate provides personal care for her and is happy to be out of the nursing facility.   [name] [name] has been otherwise generally healthy since she was last seen. She has no other health concerns today other than previously mentioned.  Review of Systems: Please see the HPI for neurologic and other pertinent review of systems. Otherwise, all other systems were reviewed and were negative.    Past Medical History:  Diagnosis Date   Acute respiratory failure with hypoxia ([name])    Anemia    Cellulitis 12/22/2014   Cerebral palsy ([name])    Spastic Cerebral palsy, mentally intact   Contracture, joint, multiple sites    Electric wheelchair, uses left hand to operate chair.    Depression    [name]     Dysarthria    GERD (gastroesophageal reflux disease)    Gram positive sepsis ([name]) 10/24/2014   HCAP (healthcare-associated pneumonia)    2015, 2018   History of endometriosis 10/2012   OBGYN [facility]: Laparoscopic Endometrial Ablation, TVH,   History of recurrent UTIs    Hypertension    Incontinent of feces 03/11/2016   Intracervical pessary 05/07/2011   Overview:  Overview:  Placed by [facility] GYN 04/30/11 to treat DUB and Endometriosis.  She is seen at GYN clinic at [facility] but was considered a poor surgical candidate and referred her to [facility].  For DUB she had pelvic ultrasound that showed thin stripe and she had endometrial Bx by Dr Jennette Kettle in GYN clinic, which was negative.      Macroscopic hematuria 03/10/2013   status post replacement of suprapubic tube 03/04/2013    Migraine    OCD (obsessive compulsive disorder)    Parapneumonic effusion 10/25/2014   Presence of intrathecal baclofen pump 03/07/2014   R LQ. Insertion T10.     Psychiatric illness 10/31/2014   Recurrent pulmonary embolism ([name])    Pt has history of recurrent PE and is on lifelong coumadin.    Seizures ([name]) 02/12/2016   Spasticity 01/05/2014   Transient alteration of awareness, recurrent 09/08/2006   Recurrent episodes where Calise  loses contact with her world and that have been studied thoroughly and appear nonepileptic in nature per Dr Terrace Arabia (neurology) The patient has had episodes starting in 2007 that initially began 1-4 times a day during which time  she would have periods of unawareness followed by confusion. This continuous video EEG monitoring performed from October 8-12, 2007. Had    Urethra dilated and patulous 07/03/2015   Patulous, Dilated Urethra. 54mL balloon on a 22-Fr catheter hoping this would prevent the bladder spasm from pushing the balloon down the urethra (Dr Logan Bores, Urology [facility], July 2016)    Hospitalizations: No., Head Injury: No., Nervous System Infections: No., Immunizations up to date:  Yes.   Past Medical History Comments: See HPI Copied from previous record: Birth History Extreme prematurity  Behavior History Anxiety and depression   Surgical History Past Surgical History:  Procedure Laterality Date   ABDOMINAL HYSTERECTOMY     APPENDECTOMY     BACLOFEN PUMP REFILL     x 3 times   CARPAL TUNNEL RELEASE  08/2008   Dr Teressa Senter   CESAREAN SECTION     x 2   CHOLECYSTECTOMY  10/14/2015   Procedure: LAPAROSCOPIC CHOLECYSTECTOMY;  Surgeon: Violeta Gelinas, MD;  Location: [facility] OR;  Service: General;;   COLPOSCOPY  06/2000   INTRAUTERINE DEVICE INSERTION  04/30/11   Inserted by [facility] GYN for endometriosis   LAPAROSCOPIC ASSISTED VAGINAL HYSTERECTOMY  10/27/2012   MULTIPLE EXTRACTIONS WITH ALVEOLOPLASTY Bilateral 07/19/2013   Procedure: EXTRACTIONS #4, 6,38,46,65;  Surgeon: Francene Finders, DDS;  Location: [facility] OR;  Service: Oral Surgery;  Laterality: Bilateral;   NEUROMA SURGERY Left    anterior and posterior intersosseous   PAIN PUMP IMPLANTATION N/A 03/07/2014   Procedure: baclofen pump revision/replacement and Catheter connection replacement;  Surgeon: Cristi Loron, MD;  Location: [facility];  Service: Neurosurgery;  Laterality: N/A;  baclofen pump revision/replacement and Catheter connection replacement   PROGRAMABLE BACLOFEN PUMP REVISION  03/17/14   Battery Replacement    TUBAL LIGATION  2003   URETHRA SURGERY     WRIST SURGERY  06/2010   Dr Dierdre Searles, hand surgeon, [name]    Family History family history includes Alzheimer's disease in her paternal grandfather; Asthma in her father; Breast cancer in her mother and paternal grandmother; Colon cancer in her maternal grandmother; Heart attack in her maternal grandfather. Family History is otherwise negative for migraines, seizures, cognitive impairment, blindness, deafness, birth defects, chromosomal disorder, autism.  Social History Social History   Socioeconomic History   Marital  status: Divorced    Spouse name: Not on file   Number of children: 2   Years of education: college   Highest education level: Not on file  Occupational History    Employer: UNEMPLOYED  Social Network engineer strain: Not on file   Food insecurity:    Worry: Not on file    Inability: Not on file   Transportation needs:    Medical: Not on file    Non-medical: Not on file  Tobacco Use   Smoking status: Never Smoker   Smokeless tobacco: Never Used  Substance and Sexual Activity   Alcohol use: Yes    Alcohol/week: 1.0 standard drinks    Types: 1 Glasses of wine per week    Comment: Drinks alcohol once a month.   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    She enjoys shopping.   Lives in a nursing facility,[facility] since 02/11/[facility]      Ailene Ards, who has schizophrenia, MR, is father of daughter.  Homero Fellers and pt are no longer together.  Homero Fellers does not see [name]. Daughter is 50 yo, Levy Sjogren, lives in a group home in [place].    [name]'s mother reportedly has breast cancer.    Needs assistance with ADL's and IADL's--has personal care services 20 hrs/wk;       No tobacco, EtOH, drugs.      **Case Manager: Jack Quarto [phone]      Patient lives [facility]    Allergies Allergies  Allergen Reactions   Morphine Dermatitis and Other (See Comments)    Skin turned  red Other reaction(s): Other (See Comments) Skin turned red    Physical Exam BP 98/64    Pulse 68    LMP 04/05/2011  General: thin woman, seated in a power chair, in no evident distress; graying black hair, brown eyes left handed Head: normocephalic and atraumatic. Oropharynx benign other than very poor dentition. No dysmorphic features. She is unable to keep her head midline.  Neck: supple with no carotid bruits. Cardiovascular: regular rate and rhythm, no murmurs. Respiratory: Clear to auscultation bilaterally Abdomen: Bowel sounds present all four quadrants, abdomen soft, non-tender, non-distended.  Musculoskeletal: No skeletal deformities. She has neuromuscular scoliosis and contractures at both wrists. She is able to extend her left wrist to neutral but is unable to extend the right. She has flexion contractures at the hips, markedly increased adductor tone, contractures at the ankles with tight heel cords.  Skin: no rashes or neurocutaneous lesions  Neurologic Exam Mental Status: Awake and fully alert. Attention span, concentration and fund of knowledge appropriate for age. Speech with significant dysarthria, and she is difficult to understand due to severe oral motor apraxia. She is able to follow some commands and participate in examination. Mood and affect flat. Cranial Nerves: Fundoscopic exam - red reflex present.  Unable to fully visualize fundus.  Pupils equal briskly reactive to light. Visual fields full to confrontation. Hearing intact to whisper. Facial movements are asymmetric, has lower facial weakness with drooling.  Neck flexion and extension abnormal with poor head control.  Motor: Spastic dystonic quadriparesis. Right arm is flexed at the elbow. She is not able to fully extend her fingers. She manipulates the joystick for her wheelchair and her phone with her left hand. She can minimally abduct her legs at the hips. Sensory: Intact to touch and temperature Coordination:  Unable to adequately assess due to patient's inability to participate in examination. No dysmetria when reaching for objects. Gait and Station: Unable to independently stand and bear weight. I did not get her out of the chair today. Reflexes: Diminished and symmetric. Toes neutral. No clonus  Impression 1.  Migraine without aura 2.  Spastic/athetoid quadriparesis 3.  Tension headache 4.  Dysarthria with oral motor apraxia 5.  Depression 6.  History of extreme prematurity  Recommendations for plan of care The patient's previous [facility] records were reviewed. [name] [name] has neither had nor required  imaging or lab studies since the last visit other than what was performed when she was hospitalized. She is aware of those results. 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 She reports increased depression today because her mother passed away recently. [name] [name] is taking and tolerating Topiramate for migraine prevention. She admits to more headaches recently because of her mother's passing. She also complains of generalized muscle spasms. I talked with [name] [name] about the headaches and recommended that we make no changes in her treatment today. I reminded her of the need for her to drink plenty of water while she is taking Topiramate. I strongly urged her to continue to see her therapist. We talked about the muscle spasms and I told her that they can be worse when depressed and when she is less active. I encouraged her to ask her caregiver to perform range of motion exercises at least twice per day. [name] [name] has a follow up appointment with Dr Sharene Skeans in May, and I will try to see her same day for her headaches. She agreed with the plans made today.   The medication list was reviewed and reconciled.  No changes were made in the prescribed medications today.  A complete medication list was provided to the patient.  Allergies as  of 02/10/2019      Reactions   Morphine Dermatitis, Other (See Comments)   Skin turned red Other reaction(s): Other (See Comments) Skin turned red      Medication List       Accurate as of February 10, 2019  3:22 PM. Always use your most recent med list.        acetaminophen 650 MG CR tablet Commonly known as:  Tylenol 8 Hour Take 1 tablet (650 mg total) by mouth every 8 (eight) hours as needed.   amantadine 100 MG capsule Commonly known as:  SYMMETREL Take 100 mg by mouth daily.   azelastine 0.1 % nasal spray Commonly known as:  ASTELIN Place 2 sprays into both nostrils 2 (two) times daily. Use in each nostril as directed   baclofen 20 MG tablet Commonly known as:  LIORESAL Take 1 tablet (20 mg total) by mouth 4 (four) times daily. For bladder spasms   Buprenorphine HCl 450 MCG Film Commonly known as:  Belbuca Place 450 mcg inside cheek every 12 (twelve) hours.   busPIRone 5 MG tablet Commonly known as:  BUSPAR Take 5 mg by mouth 3 (three) times daily.   clonazePAM 0.5 MG tablet Commonly known as:  KLONOPIN Take 1.5 tablets (0.75 mg total) by mouth 3 (three) times daily.   eucerin cream Apply topically as needed for wound care.   feeding supplement (ENSURE ENLIVE) Liqd Take 237 mLs by mouth 2 (two) times daily between meals.   FLUoxetine 40 MG capsule Commonly known as:  PROZAC Take 80 mg by mouth daily.   guaiFENesin 600 MG 12 hr tablet Commonly known as:  MUCINEX Take 600 mg by mouth 2 (two) times daily.   hydroxychloroquine 200 MG tablet Commonly known as:  PLAQUENIL Take 200 mg by mouth daily.   lamoTRIgine 100 MG tablet Commonly known as:  LAMICTAL Take 100 mg by mouth at bedtime.   linaclotide 145 MCG Caps capsule Commonly known as:  LINZESS Take 145 mcg by mouth daily before breakfast.   Melatonin 5 MG Tabs Take 1 tablet by mouth at bedtime.   omeprazole 20 MG capsule Commonly known  as:  PRILOSEC Take 1 capsule (20 mg total) by mouth  daily.   ondansetron 4 MG tablet Commonly known as:  ZOFRAN Take 1 tablet (4 mg total) by mouth every 6 (six) hours as needed for nausea.   oxybutynin 5 MG tablet Commonly known as:  DITROPAN Take 5 mg by mouth 4 (four) times daily.   polyethylene glycol powder powder Commonly known as:  GLYCOLAX/MIRALAX Take 1  1/2 dose  daily in 12 ounces of fluid every day.   QUEtiapine 100 MG tablet Commonly known as:  SEROquel Take 1 tablet (100 mg total) by mouth at bedtime.   QUEtiapine 50 MG tablet Commonly known as:  SEROQUEL Take 50 mg by mouth at bedtime.   rivaroxaban 10 MG Tabs tablet Commonly known as:  XARELTO Take 10 mg by mouth daily.   Roxicodone 5 MG immediate release tablet Generic drug:  oxyCODONE Take 5 mg by mouth every 4 (four) hours as needed for moderate pain or severe pain. 9 am , 1 pm, 5 pm, 9 pm   tiZANidine 4 MG capsule Commonly known as:  ZANAFLEX Take 1 capsule (4 mg total) by mouth 2 (two) times daily.   topiramate 100 MG tablet Commonly known as:  TOPAMAX Take 1 tablet at bedtime   venlafaxine XR 37.5 MG 24 hr capsule Commonly known as:  EFFEXOR-XR Take 37.5 mg by mouth daily.       Total time spent with the patient was 30 minutes, of which 50% or more was spent in counseling and coordination of care.   Elveria Rising NP-C

## 2019-02-10 NOTE — Patient Instructions (Signed)
Thank you for coming in today.   Instructions for you until your next appointment are as follows: 1. Continue taking Topiramate 100mg  at bedtime for migraine prevention 2. Remember that it is important that you drink at least 40oz of water per day 3. Continue to see your therapist regularly to work through your feelings about your Mom's passing 4. Please return to see Dr Sharene Skeans on May 7th for pump refill - plan to check in by 10:30 AM that day 5. Try to get some range of motion exercises in your arms and legs. That will help with the spasms that you are experiencing.

## 2019-02-12 ENCOUNTER — Encounter (INDEPENDENT_AMBULATORY_CARE_PROVIDER_SITE_OTHER): Payer: Self-pay | Admitting: Family

## 2019-02-13 ENCOUNTER — Encounter (HOSPITAL_COMMUNITY): Payer: Self-pay

## 2019-02-13 ENCOUNTER — Emergency Department (HOSPITAL_COMMUNITY)
Admission: EM | Admit: 2019-02-13 | Discharge: 2019-02-13 | Disposition: A | Discharge status: Home / Self Care | Payer: Medicare Other | Source: Home / Self Care | Attending: Emergency Medicine | Admitting: Emergency Medicine

## 2019-02-13 ENCOUNTER — Emergency Department (HOSPITAL_COMMUNITY): Payer: Medicare Other

## 2019-02-13 ENCOUNTER — Other Ambulatory Visit: Payer: Self-pay

## 2019-02-13 DIAGNOSIS — I10 Essential (primary) hypertension: Secondary | ICD-10-CM | POA: Insufficient documentation

## 2019-02-13 DIAGNOSIS — Z79899 Other long term (current) drug therapy: Secondary | ICD-10-CM

## 2019-02-13 DIAGNOSIS — N390 Urinary tract infection, site not specified: Secondary | ICD-10-CM

## 2019-02-13 DIAGNOSIS — R1030 Lower abdominal pain, unspecified: Secondary | ICD-10-CM | POA: Insufficient documentation

## 2019-02-13 DIAGNOSIS — Z7901 Long term (current) use of anticoagulants: Secondary | ICD-10-CM | POA: Insufficient documentation

## 2019-02-13 LAB — BASIC METABOLIC PANEL
Anion gap: 9 (ref 5–15)
BUN: 13 mg/dL (ref 6–20)
CO2: 23 mmol/L (ref 22–32)
CREATININE: 0.49 mg/dL (ref 0.44–1.00)
Calcium: 9.2 mg/dL (ref 8.9–10.3)
Chloride: 106 mmol/L (ref 98–111)
GFR calc non Af Amer: >60 mL/min (ref >60)
Glucose, Bld: 134 mg/dL — ABNORMAL HIGH (ref 70–99)
Potassium: 3.8 mmol/L (ref 3.5–5.1)
SODIUM: 138 mmol/L (ref 135–145)

## 2019-02-13 LAB — CBC WITH DIFFERENTIAL/PLATELET
Abs Immature Granulocytes: 0.02 10*3/uL (ref 0.00–0.07)
Basophils Absolute: 0 10*3/uL (ref 0.0–0.1)
Basophils Relative: 1 %
EOS PCT: 3 %
Eosinophils Absolute: 0.2 10*3/uL (ref 0.0–0.5)
HCT: 42.3 % (ref 36.0–46.0)
Hemoglobin: 13.3 g/dL (ref 12.0–15.0)
Immature Granulocytes: 0 %
Lymphocytes Relative: 17 %
Lymphs Abs: 1.1 10*3/uL (ref 0.7–4.0)
MCH: 30.3 pg (ref 26.0–34.0)
MCHC: 31.4 g/dL (ref 30.0–36.0)
MCV: 96.4 fL (ref 80.0–100.0)
MONO ABS: 0.3 10*3/uL (ref 0.1–1.0)
Monocytes Relative: 4 %
Neutro Abs: 4.7 10*3/uL (ref 1.7–7.7)
Neutrophils Relative %: 75 %
Platelets: 228 10*3/uL (ref 150–400)
RBC: 4.39 MIL/uL (ref 3.87–5.11)
RDW: 12.7 % (ref 11.5–15.5)
WBC: 6.3 10*3/uL (ref 4.0–10.5)
nRBC: 0 % (ref 0.0–0.2)

## 2019-02-13 LAB — URINALYSIS, ROUTINE W REFLEX MICROSCOPIC
Bilirubin Urine: NEGATIVE
Glucose, UA: NEGATIVE mg/dL
Hgb urine dipstick: NEGATIVE
Ketones, ur: NEGATIVE mg/dL
NITRITE: NEGATIVE
Protein, ur: 30 mg/dL — AB
Specific Gravity, Urine: 1.01 (ref 1.005–1.030)
pH: 9 — ABNORMAL HIGH (ref 5.0–8.0)

## 2019-02-13 MED ORDER — CEPHALEXIN 500 MG PO CAPS: 500.0000 mg | ORAL_CAPSULE | Freq: Four times a day (QID) | ORAL | 0 refills | Status: DC

## 2019-02-13 MED ORDER — SODIUM CHLORIDE (PF) 0.9 % IJ SOLN
INTRAMUSCULAR | Status: AC
  Filled 2019-02-13: qty 50

## 2019-02-13 MED ORDER — SODIUM CHLORIDE 0.9 % IV SOLN
1.0000 g | INTRAVENOUS | Status: DC
  Administered 2019-02-13: 1 g via INTRAVENOUS
  Filled 2019-02-13: qty 10

## 2019-02-13 MED ORDER — IOHEXOL 300 MG/ML  SOLN
75.0000 mL | Freq: Once | INTRAMUSCULAR | Status: AC | PRN
  Administered 2019-02-13: 75 mL via INTRAVENOUS

## 2019-02-13 MED ORDER — SODIUM CHLORIDE 0.9 % IV SOLN: INTRAVENOUS | Status: DC

## 2019-02-13 MED ORDER — PHENAZOPYRIDINE HCL 200 MG PO TABS
200.0000 mg | ORAL_TABLET | Freq: Three times a day (TID) | ORAL | Status: DC
  Administered 2019-02-13: 200 mg via ORAL
  Filled 2019-02-13: qty 1

## 2019-02-13 MED ORDER — OXYCODONE-ACETAMINOPHEN 5-325 MG PO TABS
1.0000 | ORAL_TABLET | Freq: Once | ORAL | Status: AC
  Administered 2019-02-13: 1 via ORAL
  Filled 2019-02-13: qty 1

## 2019-02-13 NOTE — ED Notes (Signed)
Bed: WA02 Expected date:  Expected time:  Means of arrival:  Comments: 43 yo bladder pain

## 2019-02-13 NOTE — ED Triage Notes (Signed)
EMS reports from home, Pt c/o bladder pain since past night, foley in place and Pt states has been leaking. Seen recently for same, replaced catheter.  BP 98/74 HR 100 RR 18 Sp02 RA

## 2019-02-13 NOTE — ED Provider Notes (Signed)
[facility]-EMERGENCY DEPT Provider Note   CSN: [id number] Arrival date & time: 02/13/19  1025    History   Chief Complaint Chief Complaint  Patient presents with   Abdominal Pain    HPI [name] [name] is a 43 y.o. female.     43 year old female with history of suprapubic catheter who presents with urinary leakage as well as abdominal discomfort.  No fever, chills, emesis.  Seen recently for similar symptoms and had a catheter change.  Notes pain is dull and persistent.  Nothing makes it better or worse and no treatment used prior to arrival     Past Medical History:  Diagnosis Date   Acute respiratory failure with hypoxia ([name])    Anemia    Cellulitis 12/22/2014   Cerebral palsy ([name])    Spastic Cerebral palsy, mentally intact   Contracture, joint, multiple sites    Electric wheelchair, uses left hand to operate chair.    Depression    [name]    Dysarthria    GERD (gastroesophageal reflux disease)    Gram positive sepsis ([name]) 10/24/2014   HCAP (healthcare-associated pneumonia)    2015, 2018   History of endometriosis 10/2012   OBGYN [facility]: Laparoscopic Endometrial Ablation, TVH,   History of recurrent UTIs    Hypertension    Incontinent of feces 03/11/2016   Intracervical pessary 05/07/2011   Overview:  Overview:  Placed by [facility]YN 04/30/11 to treat DUB and Endometriosis.  She is seen at GYN clinic at [facility] but was considered a poor surgical candidate and referred her to [facility].  For DUB she had pelvic ultrasound that showed thin stripe and she had endometrial Bx by Dr Jennette Kettle in GYN clinic, which was negative.      Macroscopic hematuria 03/10/2013   status post replacement of suprapubic tube 03/04/2013    Migraine    OCD (obsessive compulsive disorder)    Parapneumonic effusion 10/25/2014   Presence of intrathecal baclofen pump 03/07/2014   R LQ. Insertion T10.     Psychiatric illness 10/31/2014     Recurrent pulmonary embolism ([name])    Pt has history of recurrent PE and is on lifelong coumadin.    Seizures ([name]) 02/12/2016   Spasticity 01/05/2014   Transient alteration of awareness, recurrent 09/08/2006   Recurrent episodes where Judea  loses contact with her world and that have been studied thoroughly and appear nonepileptic in nature per Dr Terrace Arabia (neurology) The patient has had episodes starting in 2007 that initially began 1-4 times a day during which time she would have periods of unawareness followed by confusion. This continuous video EEG monitoring performed from October 8-12, 2007. Had    Urethra dilated and patulous 07/03/2015   Patulous, Dilated Urethra. 76mL balloon on a 22-Fr catheter hoping this would prevent the bladder spasm from pushing the balloon down the urethra (Dr Logan Bores, Urology [facility], July 2016)     Patient Active Problem List   Diagnosis Date Noted   Cough 01/04/2019   Bad taste in mouth 01/04/2019   Hypotension 11/19/2018   Complicated UTI (urinary tract infection) 11/18/2018   Solitary pulmonary nodule on lung CT 10/28/2018   Connective tissue disorder ([name]) 08/07/2018   Migraine without aura and without status migrainosus, not intractable 06/26/2018   UTI (urinary tract infection) 05/25/2018   Headache 05/25/2018   Incontinent of feces 03/11/2016   Chronic anticoagulation 03/11/2016   Involuntary movements 02/22/2016   Seizures ([name]) 02/12/2016   Dyspnea  02/12/2016   Cerebral palsy, quadriplegic ([name]) 02/06/2016   Swelling of extremity 01/13/2016   Nausea without vomiting 11/09/2015   Fever    Pressure ulcer 10/09/2015   Status post insertion of inferior vena caval filter 09/28/2015   Absence of bladder continence 08/25/2015   Person living in residential institution 06/14/2015   Pulmonary hypertension ([name])    Anemia of chronic disease    Spasticity 03/15/2015   Constipation, chronic 10/10/2014   Athetoid cerebral  palsy ([name]) 04/13/2014   Dystonia 04/12/2014   UTI (lower urinary tract infection) 03/16/2014   Presence of intrathecal baclofen pump 03/07/2014   Dental caries 07/18/2013   Multiple thyroid nodules 05/20/2013   Osteoarthrosis 04/07/2013   [name]    Dysarthria    GERD (gastroesophageal reflux disease)    Neurogenic bladder 02/05/2013   Anxiety state 10/21/2012   Chronic pain syndrome 06/17/2012   Recurrent pulmonary embolism ([name]) 05/03/2011   Pulmonary embolism with infarction ([name]) 05/03/2011   Chronic suprapubic catheter ([name]) 03/15/2011   Disease of female genital organs 10/24/2010   HYPERTENSION, BENIGN 01/31/2010   CP (cerebral palsy), spastic ([name]) 12/27/2008   Contracted, joint, multiple sites 12/27/2008   Major depressive disorder, recurrent episode ([name]) 01/29/2007   Obsessive Compulsive Disorder 01/29/2007   Transient alteration of awareness, recurrent 09/08/2006    Past Surgical History:  Procedure Laterality Date   ABDOMINAL HYSTERECTOMY     APPENDECTOMY     BACLOFEN PUMP REFILL     x 3 times   CARPAL TUNNEL RELEASE  08/2008   Dr Teressa Senter   CESAREAN SECTION     x 2   CHOLECYSTECTOMY  10/14/2015   Procedure: LAPAROSCOPIC CHOLECYSTECTOMY;  Surgeon: Violeta Gelinas, MD;  Location: [facility] OR;  Service: General;;   COLPOSCOPY  06/2000   INTRAUTERINE DEVICE INSERTION  04/30/11   Inserted by [facility] for endometriosis   LAPAROSCOPIC ASSISTED VAGINAL HYSTERECTOMY  10/27/2012   MULTIPLE EXTRACTIONS WITH ALVEOLOPLASTY Bilateral 07/19/2013   Procedure: EXTRACTIONS #4, 1,61,09,60;  Surgeon: Francene Finders, DDS;  Location: [facility] OR;  Service: Oral Surgery;  Laterality: Bilateral;   NEUROMA SURGERY Left    anterior and posterior intersosseous   PAIN PUMP IMPLANTATION N/A 03/07/2014   Procedure: baclofen pump revision/replacement and Catheter connection replacement;  Surgeon: Cristi Loron, MD;  Location: [facility]  NEURO ORS;  Service: Neurosurgery;  Laterality: N/A;  baclofen pump revision/replacement and Catheter connection replacement   PROGRAMABLE BACLOFEN PUMP REVISION  03/17/14   Battery Replacement    TUBAL LIGATION  2003   URETHRA SURGERY     WRIST SURGERY  06/2010   Dr Dierdre Searles, hand surgeon, [name]     OB History   No obstetric history on file.      Home Medications    Prior to Admission medications   Medication Sig Start Date End Date Taking? Authorizing Provider  acetaminophen (TYLENOL 8 HOUR) 650 MG CR tablet Take 1 tablet (650 mg total) by mouth every 8 (eight) hours as needed. Patient taking differently: Take 1,300 mg by mouth 2 (two) times daily as needed for pain.  07/08/18   Derwood Kaplan, MD  amantadine (SYMMETREL) 100 MG capsule Take 100 mg by mouth daily.     [provider]  azelastine (ASTELIN) 0.1 % nasal spray Place 2 sprays into both nostrils 2 (two) times daily. Use in each nostril as directed    [provider]  baclofen (LIORESAL) 20 MG tablet Take 1 tablet (20 mg total)  by mouth 4 (four) times daily. For bladder spasms 12/28/18   Elveria Rising, NP  Buprenorphine HCl (BELBUCA) 450 MCG FILM Place 450 mcg inside cheek every 12 (twelve) hours. 08/22/18   Amin, Loura Halt, MD  busPIRone (BUSPAR) 5 MG tablet Take 5 mg by mouth 3 (three) times daily.    [provider]  clonazePAM (KLONOPIN) 0.5 MG tablet Take 1.5 tablets (0.75 mg total) by mouth 3 (three) times daily. 11/27/17   Narda Bonds, MD  feeding supplement, ENSURE ENLIVE, (ENSURE ENLIVE) LIQD Take 237 mLs by mouth 2 (two) times daily between meals. 05/30/18   Darlin Drop, DO  FLUoxetine (PROZAC) 40 MG capsule Take 80 mg by mouth daily.    [provider]  guaiFENesin (MUCINEX) 600 MG 12 hr tablet Take 600 mg by mouth 2 (two) times daily.    [provider]  hydroxychloroquine (PLAQUENIL) 200 MG tablet Take 200 mg by mouth daily.    [provider]    lamoTRIgine (LAMICTAL) 100 MG tablet Take 100 mg by mouth at bedtime.    [provider]  linaclotide (LINZESS) 145 MCG CAPS capsule Take 145 mcg by mouth daily before breakfast.    [provider]  Melatonin 5 MG TABS Take 1 tablet by mouth at bedtime.    [provider]  omeprazole (PRILOSEC) 20 MG capsule Take 1 capsule (20 mg total) by mouth daily. 01/04/19   Georgina Quint, MD  ondansetron (ZOFRAN) 4 MG tablet Take 1 tablet (4 mg total) by mouth every 6 (six) hours as needed for nausea. 11/19/18   Alwyn Ren, MD  oxybutynin (DITROPAN) 5 MG tablet Take 5 mg by mouth 4 (four) times daily.  02/20/16   [provider]  oxyCODONE (ROXICODONE) 5 MG immediate release tablet Take 5 mg by mouth every 4 (four) hours as needed for moderate pain or severe pain. 9 am , 1 pm, 5 pm, 9 pm    [provider]  polyethylene glycol powder (GLYCOLAX/MIRALAX) powder Take 1  1/2 dose  daily in 12 ounces of fluid every day. Patient taking differently: Take 22.5 g by mouth daily.  01/01/17   Esterwood, [name] S, PA-C  QUEtiapine (SEROQUEL) 100 MG tablet Take 1 tablet (100 mg total) by mouth at bedtime. 11/19/18   Alwyn Ren, MD  QUEtiapine (SEROQUEL) 50 MG tablet Take 50 mg by mouth at bedtime. 01/08/19   [provider]  rivaroxaban (XARELTO) 10 MG TABS tablet Take 10 mg by mouth daily.    [provider]  Skin Protectants, Misc. (EUCERIN) cream Apply topically as needed for wound care. 02/05/19   Albrizze, Kaitlyn E, PA-C  tiZANidine (ZANAFLEX) 4 MG capsule Take 1 capsule (4 mg total) by mouth 2 (two) times daily. 12/28/18   Elveria Rising, NP  topiramate (TOPAMAX) 100 MG tablet Take 1 tablet at bedtime 02/10/19   Elveria Rising, NP  venlafaxine XR (EFFEXOR-XR) 37.5 MG 24 hr capsule Take 37.5 mg by mouth daily. 01/08/19   [provider]    Family History Family History  Problem Relation Age of Onset   Asthma Father     Colon cancer Maternal Grandmother        Died in her 56's   Breast cancer Paternal Grandmother        Died in her 23's   Heart attack Maternal Grandfather        Died in his 41's   Alzheimer's disease Paternal Grandfather  Died in his 74's   Breast cancer Mother    Stomach cancer Neg Hx     Social History Social History   Tobacco Use   Smoking status: Never Smoker   Smokeless tobacco: Never Used  Substance Use Topics   Alcohol use: Yes    Alcohol/week: 1.0 standard drinks    Types: 1 Glasses of wine per week    Comment: Drinks alcohol once a month.   Drug use: No     Allergies   Morphine   Review of Systems Review of Systems  All other systems reviewed and are negative.    Physical Exam Updated Vital Signs BP 111/79    Pulse 89    Temp 98.4 F (36.9 C) (Oral)    Resp 16    LMP 04/05/2011    SpO2 96%   Physical Exam Vitals signs and nursing note reviewed.  Constitutional:      General: She is not in acute distress.    Appearance: Normal appearance. She is well-developed. She is not toxic-appearing.  HENT:     Head: Normocephalic and atraumatic.  Eyes:     General: Lids are normal.     Conjunctiva/sclera: Conjunctivae normal.     Pupils: Pupils are equal, round, and reactive to light.  Neck:     Musculoskeletal: Normal range of motion and neck supple.     Thyroid: No thyroid mass.     Trachea: No tracheal deviation.  Cardiovascular:     Rate and Rhythm: Normal rate and regular rhythm.     Heart sounds: Normal heart sounds. No murmur. No gallop.   Pulmonary:     Effort: Pulmonary effort is normal. No respiratory distress.     Breath sounds: Normal breath sounds. No stridor. No decreased breath sounds, wheezing, rhonchi or rales.  Abdominal:     General: Bowel sounds are normal. There is no distension.     Palpations: Abdomen is soft.     Tenderness: There is abdominal tenderness in the suprapubic area. There is guarding. There is no  rebound.    Musculoskeletal: Normal range of motion.        General: No tenderness.  Skin:    General: Skin is warm and dry.     Findings: No abrasion or rash.  Neurological:     Mental Status: She is alert and oriented to person, place, and time. Mental status is at baseline.     GCS: GCS eye subscore is 4. GCS verbal subscore is 5. GCS motor subscore is 6.     Cranial Nerves: No cranial nerve deficit.     Sensory: No sensory deficit.  Psychiatric:        Attention and Perception: Attention normal.      ED Treatments / Results  Labs (all labs ordered are listed, but only abnormal results are displayed) Labs Reviewed  URINE CULTURE  CBC WITH DIFFERENTIAL/PLATELET  BASIC METABOLIC PANEL  URINALYSIS, ROUTINE W REFLEX MICROSCOPIC    EKG None  Radiology No results found.  Procedures Procedures (including critical care time)  Medications Ordered in ED Medications  0.9 %  sodium chloride infusion (has no administration in time range)  oxyCODONE-acetaminophen (PERCOCET/ROXICET) 5-325 MG per tablet 1 tablet (has no administration in time range)     Initial Impression / Assessment and Plan / ED Course  I have reviewed the triage vital signs and the nursing notes.  Pertinent labs & imaging results that were available during my care of the patient  were reviewed by me and considered in my medical decision making (see chart for details).        With urinalysis shows infection.  Abdominal CT with constipation but patient had very large bowel movement here.  Will place patient on antibiotics and discharged home  Final Clinical Impressions(s) / ED Diagnoses   Final diagnoses:  None    ED Discharge Orders    None       Lorre Nick, MD 02/13/19 1525

## 2019-02-14 ENCOUNTER — Other Ambulatory Visit: Payer: Self-pay

## 2019-02-14 ENCOUNTER — Encounter (HOSPITAL_COMMUNITY): Payer: Self-pay

## 2019-02-14 ENCOUNTER — Inpatient Hospital Stay (HOSPITAL_COMMUNITY)
Admission: EM | Admit: 2019-02-14 | Discharge: 2019-02-18 | DRG: 690 | Disposition: A | Discharge status: Home / Self Care | Payer: Medicare Other | Attending: Internal Medicine | Admitting: Internal Medicine

## 2019-02-14 DIAGNOSIS — N3001 Acute cystitis with hematuria: Secondary | ICD-10-CM | POA: Diagnosis not present

## 2019-02-14 DIAGNOSIS — I2699 Other pulmonary embolism without acute cor pulmonale: Secondary | ICD-10-CM | POA: Diagnosis present

## 2019-02-14 DIAGNOSIS — Z86711 Personal history of pulmonary embolism: Secondary | ICD-10-CM

## 2019-02-14 DIAGNOSIS — N390 Urinary tract infection, site not specified: Secondary | ICD-10-CM | POA: Diagnosis not present

## 2019-02-14 DIAGNOSIS — Z9049 Acquired absence of other specified parts of digestive tract: Secondary | ICD-10-CM

## 2019-02-14 DIAGNOSIS — G801 Spastic diplegic cerebral palsy: Secondary | ICD-10-CM | POA: Diagnosis present

## 2019-02-14 DIAGNOSIS — T83511A Infection and inflammatory reaction due to indwelling urethral catheter, initial encounter: Secondary | ICD-10-CM | POA: Diagnosis present

## 2019-02-14 DIAGNOSIS — Z1612 Extended spectrum beta lactamase (ESBL) resistance: Secondary | ICD-10-CM

## 2019-02-14 DIAGNOSIS — I10 Essential (primary) hypertension: Secondary | ICD-10-CM | POA: Diagnosis present

## 2019-02-14 DIAGNOSIS — K5641 Fecal impaction: Secondary | ICD-10-CM | POA: Diagnosis present

## 2019-02-14 DIAGNOSIS — R569 Unspecified convulsions: Secondary | ICD-10-CM

## 2019-02-14 DIAGNOSIS — Z8249 Family history of ischemic heart disease and other diseases of the circulatory system: Secondary | ICD-10-CM

## 2019-02-14 DIAGNOSIS — B952 Enterococcus as the cause of diseases classified elsewhere: Secondary | ICD-10-CM | POA: Diagnosis present

## 2019-02-14 DIAGNOSIS — G4089 Other seizures: Secondary | ICD-10-CM | POA: Diagnosis present

## 2019-02-14 DIAGNOSIS — Z66 Do not resuscitate: Secondary | ICD-10-CM | POA: Diagnosis present

## 2019-02-14 DIAGNOSIS — Z7901 Long term (current) use of anticoagulants: Secondary | ICD-10-CM

## 2019-02-14 DIAGNOSIS — A499 Bacterial infection, unspecified: Secondary | ICD-10-CM

## 2019-02-14 DIAGNOSIS — Z90710 Acquired absence of both cervix and uterus: Secondary | ICD-10-CM

## 2019-02-14 DIAGNOSIS — N319 Neuromuscular dysfunction of bladder, unspecified: Secondary | ICD-10-CM | POA: Diagnosis present

## 2019-02-14 DIAGNOSIS — K219 Gastro-esophageal reflux disease without esophagitis: Secondary | ICD-10-CM | POA: Diagnosis present

## 2019-02-14 DIAGNOSIS — Z993 Dependence on wheelchair: Secondary | ICD-10-CM

## 2019-02-14 DIAGNOSIS — F419 Anxiety disorder, unspecified: Secondary | ICD-10-CM | POA: Diagnosis present

## 2019-02-14 DIAGNOSIS — D638 Anemia in other chronic diseases classified elsewhere: Secondary | ICD-10-CM | POA: Diagnosis present

## 2019-02-14 DIAGNOSIS — L8990 Pressure ulcer of unspecified site, unspecified stage: Secondary | ICD-10-CM | POA: Diagnosis present

## 2019-02-14 DIAGNOSIS — Z515 Encounter for palliative care: Secondary | ICD-10-CM

## 2019-02-14 DIAGNOSIS — G894 Chronic pain syndrome: Secondary | ICD-10-CM | POA: Diagnosis present

## 2019-02-14 DIAGNOSIS — B964 Proteus (mirabilis) (morganii) as the cause of diseases classified elsewhere: Secondary | ICD-10-CM | POA: Diagnosis present

## 2019-02-14 DIAGNOSIS — F329 Major depressive disorder, single episode, unspecified: Secondary | ICD-10-CM | POA: Diagnosis present

## 2019-02-14 DIAGNOSIS — Z79899 Other long term (current) drug therapy: Secondary | ICD-10-CM

## 2019-02-14 DIAGNOSIS — Z87440 Personal history of urinary (tract) infections: Secondary | ICD-10-CM

## 2019-02-14 DIAGNOSIS — F429 Obsessive-compulsive disorder, unspecified: Secondary | ICD-10-CM | POA: Diagnosis present

## 2019-02-14 LAB — CBC WITH DIFFERENTIAL/PLATELET
Abs Immature Granulocytes: 0.02 10*3/uL (ref 0.00–0.07)
Basophils Absolute: 0 10*3/uL (ref 0.0–0.1)
Basophils Relative: 1 %
Eosinophils Absolute: 0.2 10*3/uL (ref 0.0–0.5)
Eosinophils Relative: 3 %
HCT: 40.3 % (ref 36.0–46.0)
Hemoglobin: 12.9 g/dL (ref 12.0–15.0)
Immature Granulocytes: 0 %
LYMPHS PCT: 20 %
Lymphs Abs: 1.2 10*3/uL (ref 0.7–4.0)
MCH: 29.8 pg (ref 26.0–34.0)
MCHC: 32 g/dL (ref 30.0–36.0)
MCV: 93.1 fL (ref 80.0–100.0)
Monocytes Absolute: 0.5 10*3/uL (ref 0.1–1.0)
Monocytes Relative: 8 %
Neutro Abs: 4.2 10*3/uL (ref 1.7–7.7)
Neutrophils Relative %: 68 %
Platelets: 229 10*3/uL (ref 150–400)
RBC: 4.33 MIL/uL (ref 3.87–5.11)
RDW: 12.9 % (ref 11.5–15.5)
WBC: 6.1 10*3/uL (ref 4.0–10.5)
nRBC: 0 % (ref 0.0–0.2)

## 2019-02-14 LAB — COMPREHENSIVE METABOLIC PANEL
ALT: 12 U/L (ref 0–44)
AST: 14 U/L — AB (ref 15–41)
Albumin: 3.9 g/dL (ref 3.5–5.0)
Alkaline Phosphatase: 78 U/L (ref 38–126)
Anion gap: 7 (ref 5–15)
BUN: 9 mg/dL (ref 6–20)
CHLORIDE: 108 mmol/L (ref 98–111)
CO2: 23 mmol/L (ref 22–32)
Calcium: 9.4 mg/dL (ref 8.9–10.3)
Creatinine, Ser: 0.57 mg/dL (ref 0.44–1.00)
GFR calc Af Amer: >60 mL/min (ref >60)
GFR calc non Af Amer: >60 mL/min (ref >60)
Glucose, Bld: 106 mg/dL — ABNORMAL HIGH (ref 70–99)
POTASSIUM: 4.2 mmol/L (ref 3.5–5.1)
Sodium: 138 mmol/L (ref 135–145)
Total Bilirubin: 0.5 mg/dL (ref 0.3–1.2)
Total Protein: 7 g/dL (ref 6.5–8.1)

## 2019-02-14 MED ORDER — OXYCODONE HCL 5 MG PO TABS
10.0000 mg | ORAL_TABLET | Freq: Four times a day (QID) | ORAL | Status: DC | PRN
  Administered 2019-02-15 – 2019-02-18 (×9): 10 mg via ORAL
  Filled 2019-02-14 (×9): qty 2

## 2019-02-14 MED ORDER — VENLAFAXINE HCL ER 37.5 MG PO CP24
37.5000 mg | ORAL_CAPSULE | Freq: Every day | ORAL | Status: DC
  Administered 2019-02-15 – 2019-02-18 (×4): 37.5 mg via ORAL
  Filled 2019-02-14 (×4): qty 1

## 2019-02-14 MED ORDER — RIVAROXABAN 10 MG PO TABS
10.0000 mg | ORAL_TABLET | Freq: Every evening | ORAL | Status: DC
  Administered 2019-02-15 – 2019-02-17 (×3): 10 mg via ORAL
  Filled 2019-02-14 (×4): qty 1

## 2019-02-14 MED ORDER — MELATONIN 3 MG PO TABS
4.5000 mg | ORAL_TABLET | Freq: Every day | ORAL | Status: DC
  Administered 2019-02-15 – 2019-02-17 (×3): 4.5 mg via ORAL
  Filled 2019-02-14 (×4): qty 1.5

## 2019-02-14 MED ORDER — OXYBUTYNIN CHLORIDE 5 MG PO TABS
5.0000 mg | ORAL_TABLET | Freq: Once | ORAL | Status: AC
  Administered 2019-02-14: 5 mg via ORAL
  Filled 2019-02-14: qty 1

## 2019-02-14 MED ORDER — LAMOTRIGINE 100 MG PO TABS
100.0000 mg | ORAL_TABLET | Freq: Every day | ORAL | Status: DC
  Administered 2019-02-15 – 2019-02-17 (×4): 100 mg via ORAL
  Filled 2019-02-14 (×4): qty 1

## 2019-02-14 MED ORDER — ONDANSETRON HCL 4 MG PO TABS: 4.0000 mg | ORAL_TABLET | Freq: Four times a day (QID) | ORAL | Status: DC | PRN

## 2019-02-14 MED ORDER — ACETAMINOPHEN 325 MG PO TABS
650.0000 mg | ORAL_TABLET | Freq: Four times a day (QID) | ORAL | Status: DC | PRN
  Administered 2019-02-15: 650 mg via ORAL
  Filled 2019-02-14: qty 2

## 2019-02-14 MED ORDER — GUAIFENESIN ER 600 MG PO TB12: 600.0000 mg | ORAL_TABLET | Freq: Two times a day (BID) | ORAL | Status: DC | PRN

## 2019-02-14 MED ORDER — ONDANSETRON HCL 4 MG/2ML IJ SOLN: 4.0000 mg | Freq: Four times a day (QID) | INTRAMUSCULAR | Status: DC | PRN

## 2019-02-14 MED ORDER — FERROUS SULFATE 325 (65 FE) MG PO TABS
325.0000 mg | ORAL_TABLET | Freq: Every day | ORAL | Status: DC
  Administered 2019-02-15 – 2019-02-18 (×4): 325 mg via ORAL
  Filled 2019-02-14 (×4): qty 1

## 2019-02-14 MED ORDER — HYDROMORPHONE HCL 1 MG/ML IJ SOLN
0.5000 mg | Freq: Once | INTRAMUSCULAR | Status: AC
  Administered 2019-02-14: 0.5 mg via INTRAVENOUS
  Filled 2019-02-14: qty 1

## 2019-02-14 MED ORDER — SODIUM CHLORIDE 0.9 % IV BOLUS
500.0000 mL | Freq: Once | INTRAVENOUS | Status: AC
  Administered 2019-02-14: 500 mL via INTRAVENOUS

## 2019-02-14 MED ORDER — AMANTADINE HCL 100 MG PO CAPS
100.0000 mg | ORAL_CAPSULE | Freq: Every day | ORAL | Status: DC
  Administered 2019-02-15 – 2019-02-18 (×4): 100 mg via ORAL
  Filled 2019-02-14 (×4): qty 1

## 2019-02-14 MED ORDER — QUETIAPINE FUMARATE 50 MG PO TABS
150.0000 mg | ORAL_TABLET | Freq: Every day | ORAL | Status: DC
  Administered 2019-02-15 – 2019-02-17 (×3): 150 mg via ORAL
  Filled 2019-02-14 (×4): qty 1

## 2019-02-14 MED ORDER — FLUOXETINE HCL 20 MG PO CAPS
80.0000 mg | ORAL_CAPSULE | Freq: Every day | ORAL | Status: DC
  Administered 2019-02-15 – 2019-02-18 (×4): 80 mg via ORAL
  Filled 2019-02-14 (×4): qty 4

## 2019-02-14 MED ORDER — BACLOFEN 20 MG PO TABS
20.0000 mg | ORAL_TABLET | Freq: Four times a day (QID) | ORAL | Status: DC
  Administered 2019-02-15 – 2019-02-18 (×14): 20 mg via ORAL
  Filled 2019-02-14 (×14): qty 1

## 2019-02-14 MED ORDER — TOPIRAMATE 25 MG PO TABS
100.0000 mg | ORAL_TABLET | Freq: Every day | ORAL | Status: DC
  Administered 2019-02-15 – 2019-02-17 (×4): 100 mg via ORAL
  Filled 2019-02-14 (×4): qty 4

## 2019-02-14 MED ORDER — SODIUM CHLORIDE 0.9 % IV SOLN
1.0000 g | Freq: Two times a day (BID) | INTRAVENOUS | Status: DC
  Administered 2019-02-14 – 2019-02-15 (×2): 1 g via INTRAVENOUS
  Filled 2019-02-14 (×4): qty 1

## 2019-02-14 MED ORDER — LINACLOTIDE 145 MCG PO CAPS
145.0000 ug | ORAL_CAPSULE | Freq: Every day | ORAL | Status: DC
  Administered 2019-02-15 – 2019-02-18 (×4): 145 ug via ORAL
  Filled 2019-02-14 (×4): qty 1

## 2019-02-14 MED ORDER — CLONAZEPAM 0.25 MG PO TBDP
0.7500 mg | ORAL_TABLET | Freq: Three times a day (TID) | ORAL | Status: DC
  Administered 2019-02-15 – 2019-02-18 (×10): 0.75 mg via ORAL
  Filled 2019-02-14: qty 6
  Filled 2019-02-14 (×10): qty 3

## 2019-02-14 MED ORDER — BUSPIRONE HCL 10 MG PO TABS
5.0000 mg | ORAL_TABLET | Freq: Three times a day (TID) | ORAL | Status: DC
  Administered 2019-02-15 – 2019-02-18 (×10): 5 mg via ORAL
  Filled 2019-02-14 (×2): qty 0.5
  Filled 2019-02-14 (×2): qty 1
  Filled 2019-02-14: qty 0.5
  Filled 2019-02-14 (×2): qty 1
  Filled 2019-02-14 (×2): qty 0.5
  Filled 2019-02-14: qty 1
  Filled 2019-02-14: qty 0.5
  Filled 2019-02-14: qty 1
  Filled 2019-02-14: qty 0.5
  Filled 2019-02-14 (×2): qty 1
  Filled 2019-02-14: qty 0.5
  Filled 2019-02-14: qty 1
  Filled 2019-02-14 (×2): qty 0.5
  Filled 2019-02-14: qty 1
  Filled 2019-02-14: qty 0.5

## 2019-02-14 MED ORDER — ACETAMINOPHEN 650 MG RE SUPP: 650.0000 mg | Freq: Four times a day (QID) | RECTAL | Status: DC | PRN

## 2019-02-14 MED ORDER — OXYBUTYNIN CHLORIDE 5 MG PO TABS
5.0000 mg | ORAL_TABLET | Freq: Four times a day (QID) | ORAL | Status: DC
  Administered 2019-02-15 – 2019-02-18 (×13): 5 mg via ORAL
  Filled 2019-02-14 (×13): qty 1

## 2019-02-14 MED ORDER — HYDROXYCHLOROQUINE SULFATE 200 MG PO TABS
200.0000 mg | ORAL_TABLET | Freq: Every day | ORAL | Status: DC
  Administered 2019-02-15 – 2019-02-18 (×4): 200 mg via ORAL
  Filled 2019-02-14 (×4): qty 1

## 2019-02-14 MED ORDER — BUPRENORPHINE HCL 450 MCG BU FILM: 450.0000 ug | ORAL_FILM | Freq: Two times a day (BID) | BUCCAL | Status: DC

## 2019-02-14 MED ORDER — TIZANIDINE HCL 4 MG PO TABS
4.0000 mg | ORAL_TABLET | Freq: Two times a day (BID) | ORAL | Status: DC
  Administered 2019-02-15 – 2019-02-18 (×7): 4 mg via ORAL
  Filled 2019-02-14 (×8): qty 1

## 2019-02-14 MED ORDER — OXYCODONE HCL 5 MG PO TABS
10.0000 mg | ORAL_TABLET | Freq: Once | ORAL | Status: AC
  Administered 2019-02-14: 10 mg via ORAL
  Filled 2019-02-14: qty 2

## 2019-02-14 MED ORDER — POLYETHYLENE GLYCOL 3350 17 G PO PACK
25.5000 g | PACK | Freq: Every day | ORAL | Status: DC
  Administered 2019-02-15 – 2019-02-18 (×4): 25.5 g via ORAL
  Filled 2019-02-14 (×4): qty 2

## 2019-02-14 NOTE — ED Provider Notes (Signed)
[facility] EMERGENCY DEPARTMENT Provider Note   CSN: [id number] Arrival date & time: 02/14/19  1326    History   Chief Complaint Chief Complaint  Patient presents with   Abdominal Pain    suprapubic catheter   Recurrent UTI    HPI [name] [name] is a 43 y.o. female.     HPI   Patient is already 43 year old female with a history of cerebral palsy, recurrent urinary tract infections, neurogenic bladder, suprapubic catheter in place, contractures, presenting for persistent suprapubic pain and inability to get antibiotics.  Patient reports that she went to [facility] emergency department yesterday, received CT abdomen and pelvis and evaluated for urinary tract infection.  Patient reports that over the past couple days her urine has been refluxing out of the suprapubic catheter site and spilling onto her abdomen.  She reports that she was prescribed outpatient Keflex, but is unable to receive this antibiotic due to her age to being on vacation.  She reports that she has another aide that is attempt.  Patient denies any fevers, chills, nausea, vomiting, or feeling weak.  She does report that she has regular bowel movements and does not feel that she is constipated.  Past Medical History:  Diagnosis Date   Acute respiratory failure with hypoxia ([name])    Anemia    Cellulitis 12/22/2014   Cerebral palsy ([name])    Spastic Cerebral palsy, mentally intact   Contracture, joint, multiple sites    Electric wheelchair, uses left hand to operate chair.    Depression    [name]    Dysarthria    GERD (gastroesophageal reflux disease)    Gram positive sepsis ([name]) 10/24/2014   HCAP (healthcare-associated pneumonia)    2015, 2018   History of endometriosis 10/2012   OBGYN [facility]: Laparoscopic Endometrial Ablation, TVH,   History of recurrent UTIs    Hypertension    Incontinent of feces 03/11/2016   Intracervical pessary 05/07/2011   Overview:  Overview:  Placed by [facility] GYN 04/30/11 to treat DUB and Endometriosis.  She is seen at GYN clinic at [facility] but was considered a poor surgical candidate and referred her to [facility].  For DUB she had pelvic ultrasound that showed thin stripe and she had endometrial Bx by Dr Jennette Kettle in GYN clinic, which was negative.      Macroscopic hematuria 03/10/2013   status post replacement of suprapubic tube 03/04/2013    Migraine    OCD (obsessive compulsive disorder)    Parapneumonic effusion 10/25/2014   Presence of intrathecal baclofen pump 03/07/2014   R LQ. Insertion T10.     Psychiatric illness 10/31/2014   Recurrent pulmonary embolism ([name])    Pt has history of recurrent PE and is on lifelong coumadin.    Seizures ([name]) 02/12/2016   Spasticity 01/05/2014   Transient alteration of awareness, recurrent 09/08/2006   Recurrent episodes where Antanisha  loses contact with her world and that have been studied thoroughly and appear nonepileptic in nature per Dr Terrace Arabia (neurology) The patient has had episodes starting in 2007 that initially began 1-4 times a day during which time she would have periods of unawareness followed by confusion. This continuous video EEG monitoring performed from October 8-12, 2007. Had    Urethra dilated and patulous 07/03/2015   Patulous, Dilated Urethra. 49mL balloon on a 22-Fr catheter hoping this would prevent the bladder spasm from pushing the balloon down the urethra (Dr Logan Bores, Urology [facility], July 2016)  Patient Active Problem List   Diagnosis Date Noted   Cough 01/04/2019   Bad taste in mouth 01/04/2019   Hypotension 11/19/2018   Complicated UTI (urinary tract infection) 11/18/2018   Solitary pulmonary nodule on lung CT 10/28/2018   Connective tissue disorder ([name]) 08/07/2018   Migraine without aura and without status migrainosus, not intractable 06/26/2018   UTI (urinary tract infection) 05/25/2018   Headache 05/25/2018   Incontinent of  feces 03/11/2016   Chronic anticoagulation 03/11/2016   Involuntary movements 02/22/2016   Seizures ([name]) 02/12/2016   Dyspnea 02/12/2016   Cerebral palsy, quadriplegic ([name]) 02/06/2016   Swelling of extremity 01/13/2016   Nausea without vomiting 11/09/2015   Fever    Pressure ulcer 10/09/2015   Status post insertion of inferior vena caval filter 09/28/2015   Absence of bladder continence 08/25/2015   Person living in residential institution 06/14/2015   Pulmonary hypertension ([name])    Anemia of chronic disease    Spasticity 03/15/2015   Constipation, chronic 10/10/2014   Athetoid cerebral palsy ([name]) 04/13/2014   Dystonia 04/12/2014   UTI (lower urinary tract infection) 03/16/2014   Presence of intrathecal baclofen pump 03/07/2014   Dental caries 07/18/2013   Multiple thyroid nodules 05/20/2013   Osteoarthrosis 04/07/2013   [name]    Dysarthria    GERD (gastroesophageal reflux disease)    Neurogenic bladder 02/05/2013   Anxiety state 10/21/2012   Chronic pain syndrome 06/17/2012   Recurrent pulmonary embolism ([name]) 05/03/2011   Pulmonary embolism with infarction ([name]) 05/03/2011   Chronic suprapubic catheter ([name]) 03/15/2011   Disease of female genital organs 10/24/2010   HYPERTENSION, BENIGN 01/31/2010   CP (cerebral palsy), spastic ([name]) 12/27/2008   Contracted, joint, multiple sites 12/27/2008   Major depressive disorder, recurrent episode ([name]) 01/29/2007   Obsessive Compulsive Disorder 01/29/2007   Transient alteration of awareness, recurrent 09/08/2006    Past Surgical History:  Procedure Laterality Date   ABDOMINAL HYSTERECTOMY     APPENDECTOMY     BACLOFEN PUMP REFILL     x 3 times   CARPAL TUNNEL RELEASE  08/2008   Dr Teressa Senter   CESAREAN SECTION     x 2   CHOLECYSTECTOMY  10/14/2015   Procedure: LAPAROSCOPIC CHOLECYSTECTOMY;  Surgeon: Violeta Gelinas, MD;  Location: [facility] OR;  Service:  General;;   COLPOSCOPY  06/2000   INTRAUTERINE DEVICE INSERTION  04/30/11   Inserted by [facility] GYN for endometriosis   LAPAROSCOPIC ASSISTED VAGINAL HYSTERECTOMY  10/27/2012   MULTIPLE EXTRACTIONS WITH ALVEOLOPLASTY Bilateral 07/19/2013   Procedure: EXTRACTIONS #4, 0,45,40,98;  Surgeon: Francene Finders, DDS;  Location: [facility] OR;  Service: Oral Surgery;  Laterality: Bilateral;   NEUROMA SURGERY Left    anterior and posterior intersosseous   PAIN PUMP IMPLANTATION N/A 03/07/2014   Procedure: baclofen pump revision/replacement and Catheter connection replacement;  Surgeon: Cristi Loron, MD;  Location: [facility];  Service: Neurosurgery;  Laterality: N/A;  baclofen pump revision/replacement and Catheter connection replacement   PROGRAMABLE BACLOFEN PUMP REVISION  03/17/14   Battery Replacement    TUBAL LIGATION  2003   URETHRA SURGERY     WRIST SURGERY  06/2010   Dr Dierdre Searles, hand surgeon, [name]     OB History   No obstetric history on file.      Home Medications    Prior to Admission medications   Medication Sig Start Date End Date Taking? Authorizing Provider  acetaminophen (TYLENOL 8 HOUR) 650 MG CR tablet Take 1  tablet (650 mg total) by mouth every 8 (eight) hours as needed. Patient taking differently: Take 1,300 mg by mouth 2 (two) times daily as needed for pain.  07/08/18   Derwood Kaplan, MD  amantadine (SYMMETREL) 100 MG capsule Take 100 mg by mouth daily.     [provider]  azelastine (ASTELIN) 0.1 % nasal spray Place 2 sprays into both nostrils 2 (two) times daily. Use in each nostril as directed    [provider]  baclofen (LIORESAL) 20 MG tablet Take 1 tablet (20 mg total) by mouth 4 (four) times daily. For bladder spasms 12/28/18   Elveria Rising, NP  Buprenorphine HCl (BELBUCA) 450 MCG FILM Place 450 mcg inside cheek every 12 (twelve) hours. 08/22/18   Amin, Loura Halt, MD  busPIRone (BUSPAR) 5 MG tablet Take 5 mg by mouth 3 (three)  times daily.    [provider]  cephALEXin (KEFLEX) 500 MG capsule Take 1 capsule (500 mg total) by mouth 4 (four) times daily. 02/13/19   Lorre Nick, MD  clonazePAM (KLONOPIN) 0.5 MG tablet Take 1.5 tablets (0.75 mg total) by mouth 3 (three) times daily. 11/27/17   Narda Bonds, MD  feeding supplement, ENSURE ENLIVE, (ENSURE ENLIVE) LIQD Take 237 mLs by mouth 2 (two) times daily between meals. 05/30/18   Darlin Drop, DO  FLUoxetine (PROZAC) 40 MG capsule Take 80 mg by mouth daily.    [provider]  guaiFENesin (MUCINEX) 600 MG 12 hr tablet Take 600 mg by mouth 2 (two) times daily.    [provider]  hydroxychloroquine (PLAQUENIL) 200 MG tablet Take 200 mg by mouth daily.    [provider]  lamoTRIgine (LAMICTAL) 100 MG tablet Take 100 mg by mouth at bedtime.    [provider]  linaclotide (LINZESS) 145 MCG CAPS capsule Take 145 mcg by mouth daily before breakfast.    [provider]  Melatonin 5 MG TABS Take 1 tablet by mouth at bedtime.    [provider]  omeprazole (PRILOSEC) 20 MG capsule Take 1 capsule (20 mg total) by mouth daily. 01/04/19   Georgina Quint, MD  ondansetron (ZOFRAN) 4 MG tablet Take 1 tablet (4 mg total) by mouth every 6 (six) hours as needed for nausea. 11/19/18   Alwyn Ren, MD  oxybutynin (DITROPAN) 5 MG tablet Take 5 mg by mouth 4 (four) times daily.  02/20/16   [provider]  oxyCODONE (ROXICODONE) 5 MG immediate release tablet Take 5 mg by mouth every 4 (four) hours as needed for moderate pain or severe pain. 9 am , 1 pm, 5 pm, 9 pm    [provider]  polyethylene glycol powder (GLYCOLAX/MIRALAX) powder Take 1  1/2 dose  daily in 12 ounces of fluid every day. Patient taking differently: Take 22.5 g by mouth daily.  01/01/17   Esterwood, [name] S, PA-C  QUEtiapine (SEROQUEL) 50 MG tablet Take 50 mg by mouth at bedtime. 01/08/19   [provider]  rivaroxaban  (XARELTO) 10 MG TABS tablet Take 10 mg by mouth daily.    [provider]  Skin Protectants, Misc. (EUCERIN) cream Apply topically as needed for wound care. 02/05/19   Albrizze, Kaitlyn E, PA-C  tiZANidine (ZANAFLEX) 4 MG capsule Take 1 capsule (4 mg total) by mouth 2 (two) times daily. 12/28/18   Elveria Rising, NP  topiramate (TOPAMAX) 100 MG tablet Take 1 tablet at bedtime 02/10/19   Elveria Rising, NP  venlafaxine XR (  EFFEXOR-XR) 37.5 MG 24 hr capsule Take 37.5 mg by mouth daily. 01/08/19   [provider]    Family History Family History  Problem Relation Age of Onset   Asthma Father    Colon cancer Maternal Grandmother        Died in her 37's   Breast cancer Paternal Grandmother        Died in her 76's   Heart attack Maternal Grandfather        Died in his 65's   Alzheimer's disease Paternal Grandfather        Died in his 25's   Breast cancer Mother    Stomach cancer Neg Hx     Social History Social History   Tobacco Use   Smoking status: Never Smoker   Smokeless tobacco: Never Used  Substance Use Topics   Alcohol use: Yes    Alcohol/week: 1.0 standard drinks    Types: 1 Glasses of wine per week    Comment: Drinks alcohol once a month.   Drug use: No     Allergies   Morphine   Review of Systems Review of Systems  Constitutional: Negative for chills and fever.  HENT: Negative for congestion and sore throat.   Eyes: Negative for visual disturbance.  Respiratory: Negative for chest tightness and shortness of breath.   Cardiovascular: Negative for chest pain.  Gastrointestinal: Positive for abdominal pain. Negative for abdominal distention, constipation, diarrhea, nausea and vomiting.  Genitourinary: Negative for decreased urine volume, dysuria and flank pain.  Musculoskeletal: Negative for back pain and myalgias.  Skin: Negative for rash.  Neurological: Negative for dizziness, syncope, weakness, light-headedness and headaches.      Physical Exam Updated Vital Signs BP (!) 121/92 (BP Location: Left Arm)    Pulse 96    Temp 98.9 F (37.2 C) (Oral)    Ht  (1.473 m)    Wt 47 kg    LMP 04/05/2011    SpO2 100%    BMI 21.66 kg/m   Physical Exam Vitals signs and nursing note reviewed.  Constitutional:      General: She is not in acute distress.    Appearance: She is well-developed.  HENT:     Head: Normocephalic and atraumatic.  Eyes:     Conjunctiva/sclera: Conjunctivae normal.     Pupils: Pupils are equal, round, and reactive to light.  Neck:     Musculoskeletal: Normal range of motion and neck supple.  Cardiovascular:     Rate and Rhythm: Normal rate and regular rhythm.     Heart sounds: S1 normal and S2 normal. No murmur.  Pulmonary:     Effort: Pulmonary effort is normal.     Breath sounds: Normal breath sounds. No wheezing or rales.  Abdominal:     General: There is no distension.     Palpations: Abdomen is soft.     Tenderness: There is abdominal tenderness in the suprapubic area. There is no guarding or rebound.     Comments: Suprapubic catheter site assessed and has no evidence of dehiscence.  Patient appears to have urine refluxing out of the site and onto the padding over the abdomen.  Suprapubic catheter drainage assessed and appears purulent.  Musculoskeletal: Normal range of motion.        General: No deformity.  Lymphadenopathy:     Cervical: No cervical adenopathy.  Skin:    General: Skin is warm and dry.     Findings: No erythema or rash.  Neurological:  Mental Status: She is alert.     Comments: Cranial nerves grossly intact. Contractured extremities.  Patient moves extremities symmetrically and with good coordination.  Psychiatric:        Behavior: Behavior normal.        Thought Content: Thought content normal.        Judgment: Judgment normal.      ED Treatments / Results  Labs (all labs ordered are listed, but only abnormal results are displayed) Labs Reviewed -  No data to display  EKG None  Radiology Ct Abdomen Pelvis W Contrast  Result Date: 02/13/2019 CLINICAL DATA:  Bladder pain.  Leaking suprapubic catheter. EXAM: CT ABDOMEN AND PELVIS WITH CONTRAST TECHNIQUE: Multidetector CT imaging of the abdomen and pelvis was performed using the standard protocol following bolus administration of intravenous contrast. CONTRAST:  68mL OMNIPAQUE IOHEXOL 300 MG/ML  SOLN COMPARISON:  CT scan 08/19/2018 FINDINGS: Lower chest: The lung bases are clear of acute process. No pleural effusion or pulmonary lesions. The heart is normal in size. No pericardial effusion. The distal esophagus and aorta are unremarkable. Hepatobiliary: No focal hepatic lesions or intrahepatic biliary dilatation. The gallbladder is surgically absent. No common bile duct dilatation. Colonic interposition noted. Pancreas: Moderate stable pancreatic atrophy. No mass, inflammation or ductal dilatation. Spleen: Normal size.  No focal lesions. Adrenals/Urinary Tract: The adrenal glands and kidneys are unremarkable and stable. Stable left renal cyst. The bladder contains a suprapubic catheter. Appears to be in good position without complicating features. No pericystic fluid collections or hematoma. Stomach/Bowel: The stomach, duodenum, small bowel and colon are grossly normal in stable. There is a large amount of stool throughout a distended colon along with scattered air-fluid levels. Mild associated small bowel dilatation with scattered air-fluid levels. Findings suggest a persistent diffuse ileus/colonic inertia. Large amount of stool in the rectum suggesting chronic fecal impaction. Vascular/Lymphatic: The aorta is normal in caliber. No dissection. The branch vessels are patent. The major venous structures are patent. Stable IVC filter. No mesenteric or retroperitoneal mass or adenopathy. Small scattered lymph nodes are noted. Reproductive: Surgically absent. Other: No free abdominal/pelvic fluid collections  or free air. No abdominal wall hernia or subcutaneous lesions. Musculoskeletal: No significant bony findings. The spinal cord stimulator is noted. IMPRESSION: 1. Persistent diffuse bowel dilatation likely due to diffuse ileus/colonic inertia with fecal impaction in the rectum. 2. The suprapubic catheter appears to be well position without complicating features. There is some soft tissue thickening along the course of the catheter which is likely granulation tissue. No abscess or hematoma. Electronically Signed   By: Rudie Meyer M.D.   On: 02/13/2019 14:01    Procedures SUPRAPUBIC TUBE PLACEMENT Date/Time: 02/14/2019 10:33 PM Performed by: Elisha Ponder, PA-C Authorized by: Elisha Ponder, PA-C   Consent:    Consent obtained:  Verbal   Consent given by:  Patient   Risks discussed:  Bleeding, infection and pain   Alternatives discussed:  Delayed treatment Universal protocol:    Patient identity confirmed:  Verbally with patient Anesthesia (see MAR for exact dosages):    Anesthesia method:  None Procedure details:    Complexity:  Simple   Catheter type:  Foley   Catheter size:  20 Fr   Ultrasound guidance: no     Number of attempts:  1   Urine characteristics:  Foul-smelling and yellow Post-procedure details:    Patient tolerance of procedure:  Tolerated well, no immediate complications Comments:     Upon removal of patient's old  20-gauge suprapubic catheter, large amount of urine expressed.  Unclear if obstructed.  20-gauge suprapubic catheter inserted in sterile fashion and 10 cc sterile saline inserted into balloon.  Suprapubic catheter in good position.  Return of urine in bag.   (including critical care time)  Medications Ordered in ED Medications - No data to display   Initial Impression / Assessment and Plan / ED Course  I have reviewed the triage vital signs and the nursing notes.  Pertinent labs & imaging results that were available during my care of the patient  were reviewed by me and considered in my medical decision making (see chart for details).  Clinical Course as of Feb 13 2234  Wynelle Link Feb 14, 2019  2146 Patient had second large bowel movement here in the emergency department.  Appears the ileus noted on yesterday's CT is improving.  She reports that her pain is unchanged in the suprapubic region.   [AM]  2235 Patient reporting no improvement in her symptoms.  Foley catheter placed and appeared to be obstructed as large amount of urine expressed after old Foley catheter was removed.  New, 20-gauge suprapubic catheter placed.  Patient tolerated procedure well.  She reports she feels no different.   [AM]    Clinical Course User Index [AM] Elisha Ponder, PA-C       Patient nontoxic-appearing, afebrile, and hemodynamically stable.  No signs of SIRS or sepsis.  Patient due to her suprapubic catheter likely to be chronically colonized with bacteria and only treated for signs of acute illness.  With the pain that she is experiencing, and the pus apparent in her catheter bag as well as the refluxing out from her suprapubic catheter, feel that this is appropriately infectious.  Her labs are normal.  Case was reviewed with pharmacy who states that patient likely requires admission for IV antibiotics that she has grown out ESBL consistently.  Urine culture is pending at this time.  CT abdomen and pelvis reviewed from yesterday which demonstrates no evidence of acute surgical findings.  She does have a large amount of stool in the colon.  Per discussion with Dr. Willette Pa of hospital medicine, based on patient CT scan yesterday which showed large stool burden in the rectum, disimpaction was attempted.  Patient has small amount of soft brown stool present in the rectal vault without evidence of hard stool balls that can be disimpacted.  Patient reports that she had a large bowel movement last night.  Will attempt replacing Foley to see if this improves  symptoms.  Dr. Alvira Monday to discuss with hospital medicine.   Final Clinical Impressions(s) / ED Diagnoses   Final diagnoses:  ESBL (extended spectrum beta-lactamase) producing bacteria infection  Acute cystitis with hematuria    ED Discharge Orders    None       Delia Chimes 02/14/19 2304    Alvira Monday, MD 02/15/19 2217

## 2019-02-14 NOTE — H&P (Signed)
History and Physical    [name] [name] ZOX:[id number] DOB: [birth year] DOA: 02/14/2019  PCP: Justin Mend, MD  Patient coming from: Skilled nursing facility.  Chief Complaint: Abdominal pain.  HPI: [name] [name] is a 43 y.o. female with history of cerebral palsy with spasticity and chronic pain, recurrent PE presents to the ER for the second time in last 48 hours because of abdominal pain.  Abdominal pain is mostly in the suprapubic area around the suprapubic catheter.  CT scan done 24 hours prior to coming in the ER showed features concerning for constipation.  Patient denies any nausea vomiting.  Denies any fever chills or chest pain or shortness of breath.  ED Course: In the ER patient had a bowel movement.  Pain improved after that.  On exam in the ER by the ER physician it showed some mild discharge around the suprapubic catheter which was puslike.  Catheter was exchanged and changed.  UA shows features concerning for UTI done yesterday.  Cultures are pending.  Given history of ESBL and VRE patient admitted for further observation.  Review of Systems: As per HPI, rest all negative.   Past Medical History:  Diagnosis Date  . Acute respiratory failure with hypoxia ([name])   . Anemia   . Cellulitis 12/22/2014  . Cerebral palsy ([name])    Spastic Cerebral palsy, mentally intact  . Contracture, joint, multiple sites    Electric wheelchair, uses left hand to operate chair.   . Depression   . [name]   . Dysarthria   . GERD (gastroesophageal reflux disease)   . Gram positive sepsis ([name]) 10/24/2014  . HCAP (healthcare-associated pneumonia)    2015, 2018  . History of endometriosis 10/2012   OBGYN [facility]: Laparoscopic Endometrial Ablation, TVH,  . History of recurrent UTIs   . Hypertension   . Incontinent of feces 03/11/2016  . Intracervical pessary 05/07/2011   Overview:  Overview:  Placed by [facility] GYN 04/30/11 to treat DUB and  Endometriosis.  She is seen at GYN clinic at [facility] but was considered a poor surgical candidate and referred her to [facility].  For DUB she had pelvic ultrasound that showed thin stripe and she had endometrial Bx by Dr Jennette Kettle in GYN clinic, which was negative.     . Macroscopic hematuria 03/10/2013   status post replacement of suprapubic tube 03/04/2013   . Migraine   . OCD (obsessive compulsive disorder)   . Parapneumonic effusion 10/25/2014  . Presence of intrathecal baclofen pump 03/07/2014   R LQ. Insertion T10.    [name] Kitchen Psychiatric illness 10/31/2014  . Recurrent pulmonary embolism ([name])    Pt has history of recurrent PE and is on lifelong coumadin.   . Seizures ([name]) 02/12/2016  . Spasticity 01/05/2014  . Transient alteration of awareness, recurrent 09/08/2006   Recurrent episodes where Rhya  loses contact with her world and that have been studied thoroughly and appear nonepileptic in nature per Dr Terrace Arabia (neurology) The patient has had episodes starting in 2007 that initially began 1-4 times a day during which time she would have periods of unawareness followed by confusion. This continuous video EEG monitoring performed from October 8-12, 2007. Had   . Urethra dilated and patulous 07/03/2015   Patulous, Dilated Urethra. 5mL balloon on a 22-Fr catheter hoping this would prevent the bladder spasm from pushing the balloon down the urethra (Dr Logan Bores, Urology [facility], July 2016)     Past Surgical History:  Procedure Laterality  Date  . ABDOMINAL HYSTERECTOMY    . APPENDECTOMY    . BACLOFEN PUMP REFILL     x 3 times  . CARPAL TUNNEL RELEASE  08/2008   Dr Teressa Senter  . CESAREAN SECTION     x 2  . CHOLECYSTECTOMY  10/14/2015   Procedure: LAPAROSCOPIC CHOLECYSTECTOMY;  Surgeon: Violeta Gelinas, MD;  Location: [facility] OR;  Service: General;;  . COLPOSCOPY  06/2000  . INTRAUTERINE DEVICE INSERTION  04/30/11   Inserted by [facility] GYN for endometriosis  . LAPAROSCOPIC ASSISTED VAGINAL HYSTERECTOMY  10/27/2012  . MULTIPLE  EXTRACTIONS WITH ALVEOLOPLASTY Bilateral 07/19/2013   Procedure: EXTRACTIONS #4, 1,61,09,60;  Surgeon: Francene Finders, DDS;  Location: [facility] OR;  Service: Oral Surgery;  Laterality: Bilateral;  . NEUROMA SURGERY Left    anterior and posterior intersosseous  . PAIN PUMP IMPLANTATION N/A 03/07/2014   Procedure: baclofen pump revision/replacement and Catheter connection replacement;  Surgeon: Cristi Loron, MD;  Location: [facility];  Service: Neurosurgery;  Laterality: N/A;  baclofen pump revision/replacement and Catheter connection replacement  . PROGRAMABLE BACLOFEN PUMP REVISION  03/17/14   Battery Replacement   . TUBAL LIGATION  2003  . URETHRA SURGERY    . WRIST SURGERY  06/2010   Dr Dierdre Searles, hand surgeon, [name]     reports that she has never smoked. She has never used smokeless tobacco. She reports current alcohol use of about 1.0 standard drinks of alcohol per week. She reports that she does not use drugs.  Allergies  Allergen Reactions  . Morphine Dermatitis and Other (See Comments)    Skin turned red     Family History  Problem Relation Age of Onset  . Asthma Father   . Colon cancer Maternal Grandmother        Died in her 32's  . Breast cancer Paternal Grandmother        Died in her 29's  . Heart attack Maternal Grandfather        Died in his 28's  . Alzheimer's disease Paternal Grandfather        Died in his 45's  . Breast cancer Mother   . Stomach cancer Neg Hx     Prior to Admission medications   Medication Sig Start Date End Date Taking? Authorizing Provider  acetaminophen (TYLENOL 8 HOUR) 650 MG CR tablet Take 1 tablet (650 mg total) by mouth every 8 (eight) hours as needed. Patient taking differently: Take 1,300 mg by mouth 2 (two) times daily as needed for pain.  07/08/18  Yes Derwood Kaplan, MD  amantadine (SYMMETREL) 100 MG capsule Take 100 mg by mouth daily.    Yes [provider]  azelastine (ASTELIN) 0.1 % nasal spray Place 2 sprays into both  nostrils 2 (two) times daily. Use in each nostril as directed   Yes [provider]  baclofen (LIORESAL) 20 MG tablet Take 1 tablet (20 mg total) by mouth 4 (four) times daily. For bladder spasms 12/28/18  Yes Elveria Rising, NP  Buprenorphine HCl (BELBUCA) 450 MCG FILM Place 450 mcg inside cheek every 12 (twelve) hours. 08/22/18  Yes Amin, Ankit Chirag, MD  busPIRone (BUSPAR) 5 MG tablet Take 5 mg by mouth 3 (three) times daily.   Yes [provider]  clonazePAM (KLONOPIN) 0.5 MG tablet Take 1.5 tablets (0.75 mg total) by mouth 3 (three) times daily. 11/27/17  Yes Narda Bonds, MD  ferrous sulfate 325 (65 FE) MG tablet Take 325 mg by mouth daily with breakfast.  Yes [provider]  FLUoxetine (PROZAC) 40 MG capsule Take 80 mg by mouth daily.   Yes [provider]  guaiFENesin (MUCINEX) 600 MG 12 hr tablet Take 600 mg by mouth 2 (two) times daily as needed for cough or to loosen phlegm.    Yes [provider]  hydroxychloroquine (PLAQUENIL) 200 MG tablet Take 200 mg by mouth daily.   Yes [provider]  lamoTRIgine (LAMICTAL) 100 MG tablet Take 100 mg by mouth at bedtime.   Yes [provider]  linaclotide (LINZESS) 145 MCG CAPS capsule Take 145 mcg by mouth daily before breakfast.   Yes [provider]  Melatonin 5 MG TABS Take 5 mg by mouth at bedtime.    Yes [provider]  ondansetron (ZOFRAN) 4 MG tablet Take 1 tablet (4 mg total) by mouth every 6 (six) hours as needed for nausea. 11/19/18  Yes Alwyn Ren, MD  oxybutynin (DITROPAN) 5 MG tablet Take 5 mg by mouth 4 (four) times daily.  02/20/16  Yes [provider]  Oxycodone HCl 10 MG TABS Take 10 mg by mouth every 6 (six) hours as needed (pain).   Yes [provider]  polyethylene glycol powder (GLYCOLAX/MIRALAX) powder Take 1  1/2 dose  daily in 12 ounces of fluid every day. Patient taking differently: Take 25.5 g by mouth daily.   01/01/17  Yes Esterwood, [name] S, PA-C  QUEtiapine (SEROQUEL) 50 MG tablet Take 150 mg by mouth at bedtime.  01/08/19  Yes [provider]  rivaroxaban (XARELTO) 10 MG TABS tablet Take 10 mg by mouth every evening.    Yes [provider]  Skin Protectants, Misc. (EUCERIN) cream Apply topically as needed for wound care. Patient taking differently: Apply 1 application topically daily as needed for wound care.  02/05/19  Yes Albrizze, Kaitlyn E, PA-C  tiZANidine (ZANAFLEX) 4 MG capsule Take 1 capsule (4 mg total) by mouth 2 (two) times daily. 12/28/18  Yes Elveria Rising, NP  topiramate (TOPAMAX) 100 MG tablet Take 1 tablet at bedtime Patient taking differently: Take 100 mg by mouth at bedtime.  02/10/19  Yes Elveria Rising, NP  venlafaxine XR (EFFEXOR-XR) 37.5 MG 24 hr capsule Take 37.5 mg by mouth daily. 01/08/19  Yes [provider]  cephALEXin (KEFLEX) 500 MG capsule Take 1 capsule (500 mg total) by mouth 4 (four) times daily. 02/13/19   Lorre Nick, MD  feeding supplement, ENSURE ENLIVE, (ENSURE ENLIVE) LIQD Take 237 mLs by mouth 2 (two) times daily between meals. Patient not taking: Reported on 02/14/2019 05/30/18   Darlin Drop, DO  omeprazole (PRILOSEC) 20 MG capsule Take 1 capsule (20 mg total) by mouth daily. Patient not taking: Reported on 02/14/2019 01/04/19   Georgina Quint, MD    Physical Exam: Vitals:   02/14/19 2030 02/14/19 2045 02/14/19 2315 02/14/19 2345  BP: 114/81 (!) 118/92 98/61 (!) 92/57  Pulse: 86 (!) 102 73 83  Resp:   17 18  Temp:      TempSrc:      SpO2: 97% 97% 97% 98%  Weight:      Height:          Constitutional: Moderately built and nourished. Vitals:   02/14/19 2030 02/14/19 2045 02/14/19 2315 02/14/19 2345  BP: 114/81 (!) 118/92 98/61 (!) 92/57  Pulse: 86 (!) 102 73 83  Resp:   17 18  Temp:      TempSrc:      SpO2: 97%  97% 97% 98%  Weight:      Height:       Eyes: Anicteric no pallor. ENMT: No discharge from the  ears eyes nose and mouth. Neck: No mass felt.  No neck rigidity. Respiratory: No rhonchi or crepitations. Cardiovascular: S1-S2 heard. Abdomen: Soft nontender bowel sounds present. Musculoskeletal: No edema. Skin: No rash. Neurologic: Alert awake oriented to time place and person.  Multiple contractures. Psychiatric: Alert awake oriented to time place and person.   Labs on Admission: I have personally reviewed following labs and imaging studies  CBC: Recent Labs  Lab 02/13/19 1200 02/14/19 1444  WBC 6.3 6.1  NEUTROABS 4.7 4.2  HGB 13.3 12.9  HCT 42.3 40.3  MCV 96.4 93.1  PLT 228 229   Basic Metabolic Panel: Recent Labs  Lab 02/13/19 1200 02/14/19 1444  NA 138 138  K 3.8 4.2  CL 106 108  CO2 23 23  GLUCOSE 134* 106*  BUN 13 9  CREATININE 0.49 0.57  CALCIUM 9.2 9.4   GFR: Estimated Creatinine Clearance: 57.3 mL/min (by C-G formula based on SCr of 0.57 mg/dL). Liver Function Tests: Recent Labs  Lab 02/14/19 1444  AST 14*  ALT 12  ALKPHOS 78  BILITOT 0.5  PROT 7.0  ALBUMIN 3.9   No results for input(s): LIPASE, AMYLASE in the last 168 hours. No results for input(s): AMMONIA in the last 168 hours. Coagulation Profile: No results for input(s): INR, PROTIME in the last 168 hours. Cardiac Enzymes: No results for input(s): CKTOTAL, CKMB, CKMBINDEX, TROPONINI in the last 168 hours. BNP (last 3 results) No results for input(s): PROBNP in the last 8760 hours. HbA1C: No results for input(s): HGBA1C in the last 72 hours. CBG: No results for input(s): GLUCAP in the last 168 hours. Lipid Profile: No results for input(s): CHOL, HDL, LDLCALC, TRIG, CHOLHDL, LDLDIRECT in the last 72 hours. Thyroid Function Tests: No results for input(s): TSH, T4TOTAL, FREET4, T3FREE, THYROIDAB in the last 72 hours. Anemia Panel: No results for input(s): VITAMINB12, FOLATE, FERRITIN, TIBC, IRON, RETICCTPCT in the last 72 hours. Urine analysis:    Component Value Date/Time    COLORURINE AMBER (A) 02/13/2019 1136   APPEARANCEUR CLOUDY (A) 02/13/2019 1136   LABSPEC 1.010 02/13/2019 1136   PHURINE 9.0 (H) 02/13/2019 1136   GLUCOSEU NEGATIVE 02/13/2019 1136   HGBUR NEGATIVE 02/13/2019 1136   HGBUR large 10/03/2010 1530   BILIRUBINUR NEGATIVE 02/13/2019 1136   BILIRUBINUR neg 03/15/2011 1719   KETONESUR NEGATIVE 02/13/2019 1136   PROTEINUR 30 (A) 02/13/2019 1136   UROBILINOGEN 1.0 10/08/2015 2348   NITRITE NEGATIVE 02/13/2019 1136   LEUKOCYTESUR LARGE (A) 02/13/2019 1136   LEUKOCYTESUR mod 01/26/2014   Sepsis Labs: @LABRCNTIP (procalcitonin:4,lacticidven:4) )No results found for this or any previous visit (from the past 240 hour(s)).   Radiological Exams on Admission: Ct Abdomen Pelvis W Contrast  Result Date: 02/13/2019 CLINICAL DATA:  Bladder pain.  Leaking suprapubic catheter. EXAM: CT ABDOMEN AND PELVIS WITH CONTRAST TECHNIQUE: Multidetector CT imaging of the abdomen and pelvis was performed using the standard protocol following bolus administration of intravenous contrast. CONTRAST:  50mL OMNIPAQUE IOHEXOL 300 MG/ML  SOLN COMPARISON:  CT scan 08/19/2018 FINDINGS: Lower chest: The lung bases are clear of acute process. No pleural effusion or pulmonary lesions. The heart is normal in size. No pericardial effusion. The distal esophagus and aorta are unremarkable. Hepatobiliary: No focal hepatic lesions or intrahepatic biliary dilatation. The gallbladder is surgically absent. No common bile duct dilatation. Colonic interposition noted. Pancreas:  Moderate stable pancreatic atrophy. No mass, inflammation or ductal dilatation. Spleen: Normal size.  No focal lesions. Adrenals/Urinary Tract: The adrenal glands and kidneys are unremarkable and stable. Stable left renal cyst. The bladder contains a suprapubic catheter. Appears to be in good position without complicating features. No pericystic fluid collections or hematoma. Stomach/Bowel: The stomach, duodenum, small bowel and  colon are grossly normal in stable. There is a large amount of stool throughout a distended colon along with scattered air-fluid levels. Mild associated small bowel dilatation with scattered air-fluid levels. Findings suggest a persistent diffuse ileus/colonic inertia. Large amount of stool in the rectum suggesting chronic fecal impaction. Vascular/Lymphatic: The aorta is normal in caliber. No dissection. The branch vessels are patent. The major venous structures are patent. Stable IVC filter. No mesenteric or retroperitoneal mass or adenopathy. Small scattered lymph nodes are noted. Reproductive: Surgically absent. Other: No free abdominal/pelvic fluid collections or free air. No abdominal wall hernia or subcutaneous lesions. Musculoskeletal: No significant bony findings. The spinal cord stimulator is noted. IMPRESSION: 1. Persistent diffuse bowel dilatation likely due to diffuse ileus/colonic inertia with fecal impaction in the rectum. 2. The suprapubic catheter appears to be well position without complicating features. There is some soft tissue thickening along the course of the catheter which is likely granulation tissue. No abscess or hematoma. Electronically Signed   By: Rudie Meyer M.D.   On: 02/13/2019 14:01      Assessment/Plan Principal Problem:   Complicated UTI (urinary tract infection) Active Problems:   CP (cerebral palsy), spastic ([name])   Recurrent pulmonary embolism ([name])   Neurogenic bladder   HYPERTENSION, BENIGN   Anemia of chronic disease   Pressure ulcer   Seizures ([name])    1. Complicated UTI with history of VRE and ESBL with suprapubic catheter which was changed in the ER.  As per the elevation there was pus around the suprapubic catheter.  Urine cultures to be pending.  Were done yesterday.  Patient on meropenem for now. 2. History of seizures on Lamictal and Topamax. 3. Recurrent PE on Xarelto. 4. Chronic pain on buprenorphine oxycodone. 5. History of cerebral palsy on  baclofen, amantadine. 6. Depression on Seroquel Effexor Prozac.  And for anxiety on Klonopin. 7. Patient is also on Plaquenil no known exact reason.   DVT prophylaxis: Xarelto. Code Status: Full code. Family Communication: No family at the bedside. Disposition Plan: Back to facility. Consults called: None. Admission status: Observation.   Eduard Clos MD [facility] Pager [phone].  If 7PM-7AM, please contact night-coverage [email or web address] Password [facility]  02/14/2019, 11:56 PM

## 2019-02-14 NOTE — ED Triage Notes (Signed)
Pt reports lower abdominal pain with suprapubic catheter leaking urine. Urine in bag very thick, cloudy, yellow, and odorous. Pt states was at [facility] last night for this issue, caregiver at home was supposed to bring her antibiotics today but hasnt gotten back yet.  Pain was 10/10 despite oxycodone and tylenol at noon so she called EMS to bring her to hospital for eval and treatment. Denies fever or chills, well appearing and not in distress at this time.

## 2019-02-15 DIAGNOSIS — G801 Spastic diplegic cerebral palsy: Secondary | ICD-10-CM

## 2019-02-15 DIAGNOSIS — I10 Essential (primary) hypertension: Secondary | ICD-10-CM

## 2019-02-15 DIAGNOSIS — I2699 Other pulmonary embolism without acute cor pulmonale: Secondary | ICD-10-CM

## 2019-02-15 DIAGNOSIS — N390 Urinary tract infection, site not specified: Secondary | ICD-10-CM | POA: Diagnosis not present

## 2019-02-15 DIAGNOSIS — R569 Unspecified convulsions: Secondary | ICD-10-CM

## 2019-02-15 LAB — COMPREHENSIVE METABOLIC PANEL
ALBUMIN: 3.4 g/dL — AB (ref 3.5–5.0)
ALT: 10 U/L (ref 0–44)
AST: 15 U/L (ref 15–41)
Alkaline Phosphatase: 70 U/L (ref 38–126)
Anion gap: 5 (ref 5–15)
BUN: 9 mg/dL (ref 6–20)
CHLORIDE: 110 mmol/L (ref 98–111)
CO2: 25 mmol/L (ref 22–32)
Calcium: 8.9 mg/dL (ref 8.9–10.3)
Creatinine, Ser: 0.69 mg/dL (ref 0.44–1.00)
GFR calc Af Amer: >60 mL/min (ref >60)
GFR calc non Af Amer: >60 mL/min (ref >60)
Glucose, Bld: 118 mg/dL — ABNORMAL HIGH (ref 70–99)
Potassium: 5.4 mmol/L — ABNORMAL HIGH (ref 3.5–5.1)
Sodium: 140 mmol/L (ref 135–145)
Total Bilirubin: 0.5 mg/dL (ref 0.3–1.2)
Total Protein: 6.2 g/dL — ABNORMAL LOW (ref 6.5–8.1)

## 2019-02-15 LAB — CBC WITH DIFFERENTIAL/PLATELET
Abs Immature Granulocytes: 0.03 10*3/uL (ref 0.00–0.07)
Basophils Absolute: 0 10*3/uL (ref 0.0–0.1)
Basophils Relative: 1 %
Eosinophils Absolute: 0.3 10*3/uL (ref 0.0–0.5)
Eosinophils Relative: 5 %
HEMATOCRIT: 36.1 % (ref 36.0–46.0)
HEMOGLOBIN: 11.6 g/dL — AB (ref 12.0–15.0)
Immature Granulocytes: 1 %
LYMPHS ABS: 2.1 10*3/uL (ref 0.7–4.0)
Lymphocytes Relative: 35 %
MCH: 30.4 pg (ref 26.0–34.0)
MCHC: 32.1 g/dL (ref 30.0–36.0)
MCV: 94.8 fL (ref 80.0–100.0)
Monocytes Absolute: 0.6 10*3/uL (ref 0.1–1.0)
Monocytes Relative: 10 %
Neutro Abs: 3 10*3/uL (ref 1.7–7.7)
Neutrophils Relative %: 48 %
Platelets: 218 10*3/uL (ref 150–400)
RBC: 3.81 MIL/uL — ABNORMAL LOW (ref 3.87–5.11)
RDW: 13.1 % (ref 11.5–15.5)
WBC: 6.1 10*3/uL (ref 4.0–10.5)
nRBC: 0 % (ref 0.0–0.2)

## 2019-02-15 LAB — URINE CULTURE

## 2019-02-15 LAB — HIV ANTIBODY (ROUTINE TESTING W REFLEX): HIV SCREEN 4TH GENERATION: NONREACTIVE

## 2019-02-15 MED ORDER — FLEET ENEMA 7-19 GM/118ML RE ENEM
1.0000 | ENEMA | Freq: Every day | RECTAL | Status: DC | PRN
  Administered 2019-02-15: 1 via RECTAL
  Filled 2019-02-15: qty 1

## 2019-02-15 MED ORDER — SODIUM CHLORIDE 0.9 % IV SOLN
1.0000 g | Freq: Three times a day (TID) | INTRAVENOUS | Status: DC
  Administered 2019-02-15 – 2019-02-18 (×9): 1 g via INTRAVENOUS
  Filled 2019-02-15 (×11): qty 1

## 2019-02-15 MED ORDER — HYDROMORPHONE HCL 1 MG/ML IJ SOLN
0.5000 mg | Freq: Once | INTRAMUSCULAR | Status: AC
  Administered 2019-02-15: 0.5 mg via INTRAVENOUS
  Filled 2019-02-15: qty 1

## 2019-02-15 NOTE — ED Notes (Signed)
ED TO INPATIENT HANDOFF REPORT  ED Nurse Name and Phone #: jess f   S Name/Age/Gender [name] [name] 43 y.o. female Room/Bed: H021C/H021C  Code Status   Code Status: Full Code  Home/SNF/Other Home Patient oriented to: self, place, time and situation Is this baseline? Yes   Triage Complete: Triage complete  Chief Complaint Supra Pubic Leaking  Triage Note Pt reports lower abdominal pain with suprapubic catheter leaking urine. Urine in bag very thick, cloudy, yellow, and odorous. Pt states was at [facility] last night for this issue, caregiver at home was supposed to bring her antibiotics today but hasnt gotten back yet.  Pain was 10/10 despite oxycodone and tylenol at noon so she called EMS to bring her to hospital for eval and treatment. Denies fever or chills, well appearing and not in distress at this time.   Allergies Allergies  Allergen Reactions  . Morphine Dermatitis and Other (See Comments)    Skin turned red     Level of Care/Admitting Diagnosis ED Disposition    ED Disposition Condition Comment   Admit  Hospital Area: [facility] [[id number]]  Level of Care: Med-Surg [16]  I expect the patient will be discharged within 24 hours: No (not a candidate for 5C-Observation unit)  Diagnosis: Complicated UTI (urinary tract infection) [003491]  Admitting Physician: Eduard Clos [phone]  Attending Physician: Eduard Clos Florian.Pax  PT Class (Do Not Modify): Observation [104]  PT Acc Code (Do Not Modify): Observation [10022]       B Medical/Surgery History Past Medical History:  Diagnosis Date  . Acute respiratory failure with hypoxia ([name])   . Anemia   . Cellulitis 12/22/2014  . Cerebral palsy ([name])    Spastic Cerebral palsy, mentally intact  . Contracture, joint, multiple sites    Electric wheelchair, uses left hand to operate chair.   . Depression   . [name]   . Dysarthria   . GERD (gastroesophageal reflux  disease)   . Gram positive sepsis ([name]) 10/24/2014  . HCAP (healthcare-associated pneumonia)    2015, 2018  . History of endometriosis 10/2012   OBGYN [facility]: Laparoscopic Endometrial Ablation, TVH,  . History of recurrent UTIs   . Hypertension   . Incontinent of feces 03/11/2016  . Intracervical pessary 05/07/2011   Overview:  Overview:  Placed by [facility] GYN 04/30/11 to treat DUB and Endometriosis.  She is seen at GYN clinic at [facility] but was considered a poor surgical candidate and referred her to [facility].  For DUB she had pelvic ultrasound that showed thin stripe and she had endometrial Bx by Dr Jennette Kettle in GYN clinic, which was negative.     . Macroscopic hematuria 03/10/2013   status post replacement of suprapubic tube 03/04/2013   . Migraine   . OCD (obsessive compulsive disorder)   . Parapneumonic effusion 10/25/2014  . Presence of intrathecal baclofen pump 03/07/2014   R LQ. Insertion T10.    [name] Kitchen Psychiatric illness 10/31/2014  . Recurrent pulmonary embolism ([name])    Pt has history of recurrent PE and is on lifelong coumadin.   . Seizures ([name]) 02/12/2016  . Spasticity 01/05/2014  . Transient alteration of awareness, recurrent 09/08/2006   Recurrent episodes where [name] [name]  loses contact with her world and that have been studied thoroughly and appear nonepileptic in nature per Dr Terrace Arabia (neurology) The patient has had episodes starting in 2007 that initially began 1-4 times a day during which time she would have  periods of unawareness followed by confusion. This continuous video EEG monitoring performed from October 8-12, 2007. Had   . Urethra dilated and patulous 07/03/2015   Patulous, Dilated Urethra. 5mL balloon on a 22-Fr catheter hoping this would prevent the bladder spasm from pushing the balloon down the urethra (Dr Logan Bores, Urology [facility], July 2016)    Past Surgical History:  Procedure Laterality Date  . ABDOMINAL HYSTERECTOMY    . APPENDECTOMY    . BACLOFEN PUMP REFILL     x 3 times  .  CARPAL TUNNEL RELEASE  08/2008   Dr Teressa Senter  . CESAREAN SECTION     x 2  . CHOLECYSTECTOMY  10/14/2015   Procedure: LAPAROSCOPIC CHOLECYSTECTOMY;  Surgeon: Violeta Gelinas, MD;  Location: [facility] OR;  Service: General;;  . COLPOSCOPY  06/2000  . INTRAUTERINE DEVICE INSERTION  04/30/11   Inserted by [facility] GYN for endometriosis  . LAPAROSCOPIC ASSISTED VAGINAL HYSTERECTOMY  10/27/2012  . MULTIPLE EXTRACTIONS WITH ALVEOLOPLASTY Bilateral 07/19/2013   Procedure: EXTRACTIONS #4, 1,61,09,60;  Surgeon: Francene Finders, DDS;  Location: [facility] OR;  Service: Oral Surgery;  Laterality: Bilateral;  . NEUROMA SURGERY Left    anterior and posterior intersosseous  . PAIN PUMP IMPLANTATION N/A 03/07/2014   Procedure: baclofen pump revision/replacement and Catheter connection replacement;  Surgeon: Cristi Loron, MD;  Location: [facility];  Service: Neurosurgery;  Laterality: N/A;  baclofen pump revision/replacement and Catheter connection replacement  . PROGRAMABLE BACLOFEN PUMP REVISION  03/17/14   Battery Replacement   . TUBAL LIGATION  2003  . URETHRA SURGERY    . WRIST SURGERY  06/2010   Dr Dierdre Searles, hand surgeon, [place] IV Location/Drains/Wounds Patient Lines/Drains/Airways Status   Active Line/Drains/Airways    Name:   Placement date:   Placement time:   Site:   Days:   Peripheral IV 02/14/19 Left;Lateral Forearm   02/14/19    1534    Forearm   1   PICC Single Lumen 08/21/18 PICC Right Cephalic 33 cm 0 cm   08/21/18    1715    Cephalic   178   Suprapubic Catheter Latex 20 Fr.   09/06/18    1505    Latex   162          Intake/Output Last 24 hours  Intake/Output Summary (Last 24 hours) at 02/15/2019 0246 Last data filed at 02/14/2019 1802 Gross per 24 hour  Intake 100 ml  Output -  Net 100 ml    Labs/Imaging Results for orders placed or performed during the hospital encounter of 02/14/19 (from the past 48 hour(s))  Comprehensive metabolic panel     Status: Abnormal   Collection  Time: 02/14/19  2:44 PM  Result Value Ref Range   Sodium 138 135 - 145 mmol/L   Potassium 4.2 3.5 - 5.1 mmol/L   Chloride 108 98 - 111 mmol/L   CO2 23 22 - 32 mmol/L   Glucose, Bld 106 (H) 70 - 99 mg/dL   BUN 9 6 - 20 mg/dL   Creatinine, Ser 4.54 0.44 - 1.00 mg/dL   Calcium 9.4 8.9 - 09.8 mg/dL   Total Protein 7.0 6.5 - 8.1 g/dL   Albumin 3.9 3.5 - 5.0 g/dL   AST 14 (L) 15 - 41 U/L   ALT 12 0 - 44 U/L   Alkaline Phosphatase 78 38 - 126 U/L   Total Bilirubin 0.5 0.3 - 1.2 mg/dL   GFR calc non Af Amer >  60 >60 mL/min   GFR calc Af Amer >60 >60 mL/min   Anion gap 7 5 - 15    Comment: Performed at [facility], [address] [place], [place] [address]  CBC with Differential     Status: None   Collection Time: 02/14/19  2:44 PM  Result Value Ref Range   WBC 6.1 4.0 - 10.5 K/uL   RBC 4.33 3.87 - 5.11 MIL/uL   Hemoglobin 12.9 12.0 - 15.0 g/dL   HCT 60.440.3 54.036.0 - 98.146.0 %   MCV 93.1 80.0 - 100.0 fL   MCH 29.8 26.0 - 34.0 pg   MCHC 32.0 30.0 - 36.0 g/dL   RDW 19.112.9 47.811.5 - 29.515.5 %   Platelets 229 150 - 400 K/uL   nRBC 0.0 0.0 - 0.2 %   Neutrophils Relative % 68 %   Neutro Abs 4.2 1.7 - 7.7 K/uL   Lymphocytes Relative 20 %   Lymphs Abs 1.2 0.7 - 4.0 K/uL   Monocytes Relative 8 %   Monocytes Absolute 0.5 0.1 - 1.0 K/uL   Eosinophils Relative 3 %   Eosinophils Absolute 0.2 0.0 - 0.5 K/uL   Basophils Relative 1 %   Basophils Absolute 0.0 0.0 - 0.1 K/uL   Immature Granulocytes 0 %   Abs Immature Granulocytes 0.02 0.00 - 0.07 K/uL    Comment: Performed at [facility], [address] [place], [place] [address]   Ct Abdomen Pelvis W Contrast  Result Date: 02/13/2019 CLINICAL DATA:  Bladder pain.  Leaking suprapubic catheter. EXAM: CT ABDOMEN AND PELVIS WITH CONTRAST TECHNIQUE: Multidetector CT imaging of the abdomen and pelvis was performed using the standard protocol following bolus administration of intravenous contrast. CONTRAST:  75mL OMNIPAQUE IOHEXOL 300 MG/ML  SOLN  COMPARISON:  CT scan 08/19/2018 FINDINGS: Lower chest: The lung bases are clear of acute process. No pleural effusion or pulmonary lesions. The heart is normal in size. No pericardial effusion. The distal esophagus and aorta are unremarkable. Hepatobiliary: No focal hepatic lesions or intrahepatic biliary dilatation. The gallbladder is surgically absent. No common bile duct dilatation. Colonic interposition noted. Pancreas: Moderate stable pancreatic atrophy. No mass, inflammation or ductal dilatation. Spleen: Normal size.  No focal lesions. Adrenals/Urinary Tract: The adrenal glands and kidneys are unremarkable and stable. Stable left renal cyst. The bladder contains a suprapubic catheter. Appears to be in good position without complicating features. No pericystic fluid collections or hematoma. Stomach/Bowel: The stomach, duodenum, small bowel and colon are grossly normal in stable. There is a large amount of stool throughout a distended colon along with scattered air-fluid levels. Mild associated small bowel dilatation with scattered air-fluid levels. Findings suggest a persistent diffuse ileus/colonic inertia. Large amount of stool in the rectum suggesting chronic fecal impaction. Vascular/Lymphatic: The aorta is normal in caliber. No dissection. The branch vessels are patent. The major venous structures are patent. Stable IVC filter. No mesenteric or retroperitoneal mass or adenopathy. Small scattered lymph nodes are noted. Reproductive: Surgically absent. Other: No free abdominal/pelvic fluid collections or free air. No abdominal wall hernia or subcutaneous lesions. Musculoskeletal: No significant bony findings. The spinal cord stimulator is noted. IMPRESSION: 1. Persistent diffuse bowel dilatation likely due to diffuse ileus/colonic inertia with fecal impaction in the rectum. 2. The suprapubic catheter appears to be well position without complicating features. There is some soft tissue thickening along the  course of the catheter which is likely granulation tissue. No abscess or hematoma. Electronically Signed   By:  Rudie Meyer M.D.   On: 02/13/2019 14:01    Pending Labs Unresulted Labs (From admission, onward)    Start     Ordered   02/15/19 0500  HIV antibody (Routine Testing)  Tomorrow morning,   R     02/14/19 2354   02/15/19 0500  Comprehensive metabolic panel  Tomorrow morning,   R     02/14/19 2354   02/15/19 0500  CBC WITH DIFFERENTIAL  Tomorrow morning,   R     02/14/19 2354          Vitals/Pain Today's Vitals   02/14/19 2030 02/14/19 2045 02/14/19 2315 02/14/19 2345  BP: 114/81 (!) 118/92 98/61 (!) 92/57  Pulse: 86 (!) 102 73 83  Resp:   17 18  Temp:      TempSrc:      SpO2: 97% 97% 97% 98%  Weight:      Height:      PainSc:        Isolation Precautions No active isolations  Medications Medications  meropenem (MERREM) 1 g in sodium chloride 0.9 % 100 mL IVPB (0 g Intravenous Stopped 02/14/19 1802)  Buprenorphine HCl FILM 450 mcg (has no administration in time range)  oxyCODONE (Oxy IR/ROXICODONE) immediate release tablet 10 mg (has no administration in time range)  hydroxychloroquine (PLAQUENIL) tablet 200 mg (has no administration in time range)  busPIRone (BUSPAR) tablet 5 mg (has no administration in time range)  FLUoxetine (PROZAC) capsule 80 mg (has no administration in time range)  QUEtiapine (SEROQUEL) tablet 150 mg (has no administration in time range)  venlafaxine XR (EFFEXOR-XR) 24 hr capsule 37.5 mg (has no administration in time range)  linaclotide (LINZESS) capsule 145 mcg (has no administration in time range)  polyethylene glycol (MIRALAX / GLYCOLAX) packet 25.5 g (has no administration in time range)  oxybutynin (DITROPAN) tablet 5 mg (has no administration in time range)  ferrous sulfate tablet 325 mg (has no administration in time range)  rivaroxaban (XARELTO) tablet 10 mg (has no administration in time range)  Melatonin TABS 4.5 mg (has no  administration in time range)  amantadine (SYMMETREL) capsule 100 mg (has no administration in time range)  baclofen (LIORESAL) tablet 20 mg (has no administration in time range)  clonazePAM (KLONOPIN) tablet 0.75 mg (has no administration in time range)  lamoTRIgine (LAMICTAL) tablet 100 mg (has no administration in time range)  tiZANidine (ZANAFLEX) capsule 4 mg (has no administration in time range)  topiramate (TOPAMAX) tablet 100 mg (has no administration in time range)  guaiFENesin (MUCINEX) 12 hr tablet 600 mg (has no administration in time range)  acetaminophen (TYLENOL) tablet 650 mg (has no administration in time range)    Or  acetaminophen (TYLENOL) suppository 650 mg (has no administration in time range)  ondansetron (ZOFRAN) tablet 4 mg (has no administration in time range)    Or  ondansetron (ZOFRAN) injection 4 mg (has no administration in time range)  sodium chloride 0.9 % bolus 500 mL (0 mLs Intravenous Stopped 02/14/19 1722)  HYDROmorphone (DILAUDID) injection 0.5 mg (0.5 mg Intravenous Given 02/14/19 1548)  HYDROmorphone (DILAUDID) injection 0.5 mg (0.5 mg Intravenous Given 02/14/19 1720)  oxybutynin (DITROPAN) tablet 5 mg (5 mg Oral Given 02/14/19 1720)  oxyCODONE (Oxy IR/ROXICODONE) immediate release tablet 10 mg (10 mg Oral Given 02/14/19 2247)  HYDROmorphone (DILAUDID) injection 0.5 mg (0.5 mg Intravenous Given 02/15/19 0245)    Mobility non-ambulatory High fall risk   Focused Assessments antibiotics   R Recommendations: See  Admitting Provider Note  Report given to:   Additional Notes: n/a

## 2019-02-15 NOTE — Progress Notes (Signed)
Pharmacy Antibiotic Note  [name] [name] is a 43 y.o. female admitted on 02/14/2019 with abdominal pain. Pharmacy has been consulted for Meropenem dosing for UTI. On admit UA showed features concerning for UTI , history of ESBL and VRE patient admitted for further observation & started on Meropenem Today patient currently afebrile ,Tm 98.9  WBC 6.1k, SCr 0.69, crcl 54 ml/min Currently on Meropenem 1g q12h.    Plan: Increase Meropenem to 1g IV q8h. Monitor clinical status, renal function and culture results daily.    Height: 4\' 9"  (144.8 cm) Weight: 93 lb 4.8 oz (42.3 kg) IBW/kg (Calculated) : 38.6  Temp (24hrs), Avg:98.4 F (36.9 C), Min:97.9 F (36.6 C), Max:98.9 F (37.2 C)  Recent Labs  Lab 02/13/19 1200 02/14/19 1444 02/15/19 0553  WBC 6.3 6.1 6.1  CREATININE 0.49 0.57 0.69    Estimated Creatinine Clearance: 54.1 mL/min (by C-G formula based on SCr of 0.69 mg/dL).    Allergies  Allergen Reactions  . Morphine Dermatitis and Other (See Comments)    Skin turned red     Antimicrobials this admission: Meropenem 3/15>>  Dose adjustments this admission:   Microbiology results:  3/15  UCx:     Thank you for allowing pharmacy to be a part of this patient's care.  Noah Delaine, RPh Clinical Pharmacist [phone] Please check AMION for all [facility] Pharmacy phone numbers After 10:00 PM, call Main Pharmacy [phone] 02/15/2019 10:42 AM

## 2019-02-15 NOTE — Progress Notes (Signed)
Patient arrived to united via stretcher, patient alert and orientedX4. Pt have a suprapubic catheter place one 3/10.Patient denied pain and discomfort.

## 2019-02-15 NOTE — Progress Notes (Signed)
Patient ID: [name] [name], female   DOB: Aug 15, 1976, 43 y.o.   MRN: [id number]  PROGRESS NOTE    [name] [name]  XEN:[id number] DOB: 1976/07/13 DOA: 02/14/2019 PCP: Justin Mend, MD   Brief Narrative:  43 year old female with history of cerebral palsy with spasticity and chronic pain, recurrent PE presented on 02/14/2019 with abdominal pain.  CT scan of the abdomen showed features concerning for constipation.  There is some mild discharge around the suprapubic catheter which was puslike; catheter was exchanged and changed.  She was started on IV antibiotics for UTI.  Assessment & Plan:   Principal Problem:   Complicated UTI (urinary tract infection) Active Problems:   CP (cerebral palsy), spastic ([name])   Recurrent pulmonary embolism ([name])   Neurogenic bladder   HYPERTENSION, BENIGN   Anemia of chronic disease   Pressure ulcer   Seizures ([name])  Complicated UTI with history of VRE and ESBL in the urine in the past in a patient with suprapubic catheter  -Apparently there was pus coming out around the suprapubic catheter site.  Catheter was changed in the ED.  Currently on meropenem.  Will add Zyvox as well to cover for VRE.  Follow urine culture.  She is currently afebrile, with no leukocytosis.  She might be colonized with patient is drug-resistant bacteria.  History of seizures -Continue Lamictal and Topamax  Recurrent PE -Continue Xarelto  Chronic pain -On buprenorphine and oxycodone  History of cerebral palsy with spasticity -On baclofen and amantadine  History of depression -On Seroquel, Effexor, Prozac along with Klonopin   DVT prophylaxis: Xarelto Code Status: Full Family Communication: None at bedside Disposition Plan: Depends on clinical outcome  Consultants: None  Procedures: None  Antimicrobials: Meropenem from 02/14/2019 onwards   Subjective: Patient seen and examined at bedside.  She is a poor historian.  Slow to respond to questions.  Speech  intermittently is incomprehensible.  States that her belly hurts and she does not feel well but on the other hand also states that she was to go home and does not want to lose her job.  No overnight fever or vomiting reported Objective: Vitals:   02/14/19 2315 02/14/19 2345 02/15/19 0544 02/15/19 0800  BP: 98/61 (!) 92/57 95/63 (!) 98/59  Pulse: 73 83 91   Resp: 17 18 16    Temp:   97.9 F (36.6 C)   TempSrc:      SpO2: 97% 98% 96%   Weight:   42.3 kg   Height:   4\' 9"  (1.448 m)     Intake/Output Summary (Last 24 hours) at 02/15/2019 1021 Last data filed at 02/14/2019 1802 Gross per 24 hour  Intake 100 ml  Output -  Net 100 ml   Filed Weights   02/14/19 1333 02/15/19 0544  Weight: 47 kg 42.3 kg    Examination:  General exam: Appears calm and comfortable.  No distress.  Looks older than stated age.  Has multiple contractures. Respiratory system: Bilateral decreased breath sounds at bases Cardiovascular system: S1 & S2 heard, Rate controlled Gastrointestinal system: Abdomen is nondistended, soft and mildly tender in the suprapubic region with suprapubic catheter present.  Normal bowel sounds heard. Extremities: No cyanosis, edema    Data Reviewed: I have personally reviewed following labs and imaging studies  CBC: Recent Labs  Lab 02/13/19 1200 02/14/19 1444 02/15/19 0553  WBC 6.3 6.1 6.1  NEUTROABS 4.7 4.2 3.0  HGB 13.3 12.9 11.6*  HCT 42.3 40.3 36.1  MCV 96.4 93.1 94.8  PLT 228 229 218   Basic Metabolic Panel: Recent Labs  Lab 02/13/19 1200 02/14/19 1444 02/15/19 0553  NA 138 138 140  K 3.8 4.2 5.4*  CL 106 108 110  CO2 23 23 25   GLUCOSE 134* 106* 118*  BUN 13 9 9   CREATININE 0.49 0.57 0.69  CALCIUM 9.2 9.4 8.9   GFR: Estimated Creatinine Clearance: 54.1 mL/min (by C-G formula based on SCr of 0.69 mg/dL). Liver Function Tests: Recent Labs  Lab 02/14/19 1444 02/15/19 0553  AST 14* 15  ALT 12 10  ALKPHOS 78 70  BILITOT 0.5 0.5  PROT 7.0 6.2*   ALBUMIN 3.9 3.4*   No results for input(s): LIPASE, AMYLASE in the last 168 hours. No results for input(s): AMMONIA in the last 168 hours. Coagulation Profile: No results for input(s): INR, PROTIME in the last 168 hours. Cardiac Enzymes: No results for input(s): CKTOTAL, CKMB, CKMBINDEX, TROPONINI in the last 168 hours. BNP (last 3 results) No results for input(s): PROBNP in the last 8760 hours. HbA1C: No results for input(s): HGBA1C in the last 72 hours. CBG: No results for input(s): GLUCAP in the last 168 hours. Lipid Profile: No results for input(s): CHOL, HDL, LDLCALC, TRIG, CHOLHDL, LDLDIRECT in the last 72 hours. Thyroid Function Tests: No results for input(s): TSH, T4TOTAL, FREET4, T3FREE, THYROIDAB in the last 72 hours. Anemia Panel: No results for input(s): VITAMINB12, FOLATE, FERRITIN, TIBC, IRON, RETICCTPCT in the last 72 hours. Sepsis Labs: No results for input(s): PROCALCITON, LATICACIDVEN in the last 168 hours.  Recent Results (from the past 240 hour(s))  Urine Culture     Status: Abnormal   Collection Time: 02/13/19 11:40 AM  Result Value Ref Range Status   Specimen Description   Final    URINE, CLEAN CATCH Performed at [facility], [address] [place], [place] [address]    Special Requests   Final    NONE Performed at [facility], [address] [place], [place] [address]    Culture MULTIPLE SPECIES PRESENT, SUGGEST RECOLLECTION (A)  Final   Report Status 02/15/2019 FINAL  Final         Radiology Studies: Ct Abdomen Pelvis W Contrast  Result Date: 02/13/2019 CLINICAL DATA:  Bladder pain.  Leaking suprapubic catheter. EXAM: CT ABDOMEN AND PELVIS WITH CONTRAST TECHNIQUE: Multidetector CT imaging of the abdomen and pelvis was performed using the standard protocol following bolus administration of intravenous contrast. CONTRAST:  75mL OMNIPAQUE IOHEXOL 300 MG/ML  SOLN COMPARISON:  CT scan 08/19/2018 FINDINGS:  Lower chest: The lung bases are clear of acute process. No pleural effusion or pulmonary lesions. The heart is normal in size. No pericardial effusion. The distal esophagus and aorta are unremarkable. Hepatobiliary: No focal hepatic lesions or intrahepatic biliary dilatation. The gallbladder is surgically absent. No common bile duct dilatation. Colonic interposition noted. Pancreas: Moderate stable pancreatic atrophy. No mass, inflammation or ductal dilatation. Spleen: Normal size.  No focal lesions. Adrenals/Urinary Tract: The adrenal glands and kidneys are unremarkable and stable. Stable left renal cyst. The bladder contains a suprapubic catheter. Appears to be in good position without complicating features. No pericystic fluid collections or hematoma. Stomach/Bowel: The stomach, duodenum, small bowel and colon are grossly normal in stable. There is a large amount of stool throughout a distended colon along with scattered air-fluid levels. Mild associated small bowel dilatation with scattered air-fluid levels. Findings suggest a persistent diffuse ileus/colonic inertia. Large amount of stool in the rectum suggesting chronic fecal impaction.  Vascular/Lymphatic: The aorta is normal in caliber. No dissection. The branch vessels are patent. The major venous structures are patent. Stable IVC filter. No mesenteric or retroperitoneal mass or adenopathy. Small scattered lymph nodes are noted. Reproductive: Surgically absent. Other: No free abdominal/pelvic fluid collections or free air. No abdominal wall hernia or subcutaneous lesions. Musculoskeletal: No significant bony findings. The spinal cord stimulator is noted. IMPRESSION: 1. Persistent diffuse bowel dilatation likely due to diffuse ileus/colonic inertia with fecal impaction in the rectum. 2. The suprapubic catheter appears to be well position without complicating features. There is some soft tissue thickening along the course of the catheter which is likely  granulation tissue. No abscess or hematoma. Electronically Signed   By: Rudie Meyer M.D.   On: 02/13/2019 14:01        Scheduled Meds: . amantadine  100 mg Oral Daily  . baclofen  20 mg Oral QID  . Buprenorphine HCl  450 mcg Buccal Q12H  . busPIRone  5 mg Oral TID  . clonazePAM  0.75 mg Oral TID  . ferrous sulfate  325 mg Oral Q breakfast  . FLUoxetine  80 mg Oral Daily  . hydroxychloroquine  200 mg Oral Daily  . lamoTRIgine  100 mg Oral QHS  . linaclotide  145 mcg Oral QAC breakfast  . Melatonin  4.5 mg Oral QHS  . oxybutynin  5 mg Oral QID  . polyethylene glycol  25.5 g Oral Daily  . QUEtiapine  150 mg Oral QHS  . rivaroxaban  10 mg Oral QPM  . tiZANidine  4 mg Oral BID  . topiramate  100 mg Oral QHS  . venlafaxine XR  37.5 mg Oral Daily   Continuous Infusions: . meropenem (MERREM) IV 1 g (02/15/19 0708)     LOS: 0 days        Glade Lloyd, MD [facility] 02/15/2019, 10:21 AM

## 2019-02-16 DIAGNOSIS — G894 Chronic pain syndrome: Secondary | ICD-10-CM | POA: Diagnosis present

## 2019-02-16 DIAGNOSIS — I2699 Other pulmonary embolism without acute cor pulmonale: Secondary | ICD-10-CM | POA: Diagnosis not present

## 2019-02-16 DIAGNOSIS — G4089 Other seizures: Secondary | ICD-10-CM | POA: Diagnosis present

## 2019-02-16 DIAGNOSIS — F429 Obsessive-compulsive disorder, unspecified: Secondary | ICD-10-CM | POA: Diagnosis present

## 2019-02-16 DIAGNOSIS — K219 Gastro-esophageal reflux disease without esophagitis: Secondary | ICD-10-CM | POA: Diagnosis present

## 2019-02-16 DIAGNOSIS — Z90710 Acquired absence of both cervix and uterus: Secondary | ICD-10-CM | POA: Diagnosis not present

## 2019-02-16 DIAGNOSIS — F419 Anxiety disorder, unspecified: Secondary | ICD-10-CM | POA: Diagnosis present

## 2019-02-16 DIAGNOSIS — Z7901 Long term (current) use of anticoagulants: Secondary | ICD-10-CM | POA: Diagnosis not present

## 2019-02-16 DIAGNOSIS — K5641 Fecal impaction: Secondary | ICD-10-CM | POA: Diagnosis present

## 2019-02-16 DIAGNOSIS — Z515 Encounter for palliative care: Secondary | ICD-10-CM

## 2019-02-16 DIAGNOSIS — R569 Unspecified convulsions: Secondary | ICD-10-CM | POA: Diagnosis not present

## 2019-02-16 DIAGNOSIS — Z993 Dependence on wheelchair: Secondary | ICD-10-CM | POA: Diagnosis not present

## 2019-02-16 DIAGNOSIS — Z86711 Personal history of pulmonary embolism: Secondary | ICD-10-CM | POA: Diagnosis not present

## 2019-02-16 DIAGNOSIS — N390 Urinary tract infection, site not specified: Secondary | ICD-10-CM | POA: Diagnosis present

## 2019-02-16 DIAGNOSIS — Z9049 Acquired absence of other specified parts of digestive tract: Secondary | ICD-10-CM | POA: Diagnosis not present

## 2019-02-16 DIAGNOSIS — Z87440 Personal history of urinary (tract) infections: Secondary | ICD-10-CM | POA: Diagnosis not present

## 2019-02-16 DIAGNOSIS — F329 Major depressive disorder, single episode, unspecified: Secondary | ICD-10-CM | POA: Diagnosis present

## 2019-02-16 DIAGNOSIS — B964 Proteus (mirabilis) (morganii) as the cause of diseases classified elsewhere: Secondary | ICD-10-CM | POA: Diagnosis present

## 2019-02-16 DIAGNOSIS — Z79899 Other long term (current) drug therapy: Secondary | ICD-10-CM | POA: Diagnosis not present

## 2019-02-16 DIAGNOSIS — D638 Anemia in other chronic diseases classified elsewhere: Secondary | ICD-10-CM | POA: Diagnosis present

## 2019-02-16 DIAGNOSIS — N319 Neuromuscular dysfunction of bladder, unspecified: Secondary | ICD-10-CM | POA: Diagnosis present

## 2019-02-16 DIAGNOSIS — Z8249 Family history of ischemic heart disease and other diseases of the circulatory system: Secondary | ICD-10-CM | POA: Diagnosis not present

## 2019-02-16 DIAGNOSIS — Z66 Do not resuscitate: Secondary | ICD-10-CM | POA: Diagnosis present

## 2019-02-16 DIAGNOSIS — G801 Spastic diplegic cerebral palsy: Secondary | ICD-10-CM | POA: Diagnosis present

## 2019-02-16 DIAGNOSIS — I10 Essential (primary) hypertension: Secondary | ICD-10-CM | POA: Diagnosis present

## 2019-02-16 DIAGNOSIS — B952 Enterococcus as the cause of diseases classified elsewhere: Secondary | ICD-10-CM | POA: Diagnosis present

## 2019-02-16 LAB — CBC WITH DIFFERENTIAL/PLATELET
Abs Immature Granulocytes: 0.01 10*3/uL (ref 0.00–0.07)
Basophils Absolute: 0 10*3/uL (ref 0.0–0.1)
Basophils Relative: 1 %
Eosinophils Absolute: 0.3 10*3/uL (ref 0.0–0.5)
Eosinophils Relative: 6 %
HEMATOCRIT: 34 % — AB (ref 36.0–46.0)
Hemoglobin: 11.2 g/dL — ABNORMAL LOW (ref 12.0–15.0)
Immature Granulocytes: 0 %
LYMPHS ABS: 2.2 10*3/uL (ref 0.7–4.0)
Lymphocytes Relative: 43 %
MCH: 30.6 pg (ref 26.0–34.0)
MCHC: 32.9 g/dL (ref 30.0–36.0)
MCV: 92.9 fL (ref 80.0–100.0)
Monocytes Absolute: 0.5 10*3/uL (ref 0.1–1.0)
Monocytes Relative: 10 %
Neutro Abs: 2 10*3/uL (ref 1.7–7.7)
Neutrophils Relative %: 40 %
Platelets: 198 10*3/uL (ref 150–400)
RBC: 3.66 MIL/uL — ABNORMAL LOW (ref 3.87–5.11)
RDW: 13 % (ref 11.5–15.5)
WBC: 5.1 10*3/uL (ref 4.0–10.5)
nRBC: 0 % (ref 0.0–0.2)

## 2019-02-16 LAB — BASIC METABOLIC PANEL
Anion gap: 8 (ref 5–15)
BUN: 8 mg/dL (ref 6–20)
CO2: 22 mmol/L (ref 22–32)
Calcium: 8.8 mg/dL — ABNORMAL LOW (ref 8.9–10.3)
Chloride: 109 mmol/L (ref 98–111)
Creatinine, Ser: 0.59 mg/dL (ref 0.44–1.00)
GFR calc Af Amer: >60 mL/min (ref >60)
GFR calc non Af Amer: >60 mL/min (ref >60)
GLUCOSE: 100 mg/dL — AB (ref 70–99)
Potassium: 3.6 mmol/L (ref 3.5–5.1)
Sodium: 139 mmol/L (ref 135–145)

## 2019-02-16 LAB — MRSA PCR SCREENING: MRSA by PCR: POSITIVE — AB

## 2019-02-16 LAB — MAGNESIUM: Magnesium: 2 mg/dL (ref 1.7–2.4)

## 2019-02-16 MED ORDER — ALUM & MAG HYDROXIDE-SIMETH 200-200-20 MG/5ML PO SUSP
15.0000 mL | ORAL | Status: DC | PRN
  Administered 2019-02-16: 15 mL via ORAL
  Filled 2019-02-16: qty 30

## 2019-02-16 MED ORDER — SENNA 8.6 MG PO TABS
1.0000 | ORAL_TABLET | Freq: Every day | ORAL | Status: DC
  Administered 2019-02-16 – 2019-02-17 (×2): 8.6 mg via ORAL
  Filled 2019-02-16 (×2): qty 1

## 2019-02-16 NOTE — Consult Note (Signed)
Consultation Note Date: 02/16/2019   Patient Name: [name] [name]  DOB: 11-Jun-1976  MRN: [id number]  Age / Sex: 43 y.o., female  PCP: Durenda Hurt, Flossie Buffy, MD Referring Physician: Glade Lloyd, MD  Reason for Consultation: Establishing goals of care  HPI/Patient Profile: 43 y.o. female  with past medical history of CP, recurrent MDR UTI (with suprapubic cath), seizures, recurrent PE, OCD, dysarthria who was admitted on 02/14/2019 with abdominal pain.  She was found to have significant constipation and a complicated UTI.   Clinical Assessment and Goals of Care:  I have reviewed medical records including EPIC notes, labs and imaging, received report from the care team, assessed the patient and then spoke with her about GOC, EOL wishes, disposition and options.  Afterward I spoke on the phone with her father, [name] [name].  I introduced Palliative Medicine as specialized medical care for people living with serious illness. It focuses on providing relief from the symptoms and stress of a serious illness. The goal is to improve quality of life for both the patient and the family.  We discussed a brief life review of the patient. She has two children, one son (39) and a daughter (71?).  Some time ago she was forced to give up custody of her daughter which was extremely depressing to her.  Approximately 10 years ago she started to physically decline due to CP (recurrent UTI, PE, and she had the need for a cervical neck fusion).  She was in a facility at the time.  She became angry and determined.  She gained strength and was able to move back into her own home.  She now continues to live in her own home with the help of an aide.  [name] [name] tells me her current aid ([name]) has been with her approximately 3 months.    As far as functional and nutritional status [name] [name] is wheel chair bound and attempts to feed  herself with some assistance.  Unfortunately she is significantly contracted and requires assistance with all activities of daily living.  I asked [name] [name] about her GOC.  She tells me she would want her father (not her son) to be her medical decision making surrogate.  She also mentions that while she wants aggressive care she would not want to be on life support for longer than 2 weeks (no Trach).   [name] [name] is willing to complete an advanced directive but asked to do it tomorrow morning as she is tired this afternoon.  After speaking with [name] [name] I called her father [name] [name].  He was in agreement that he is her medical decision making surrogate.  He relayed that his wife died last year.  She had elected DNR with absolutely no life support.  We talked about the odds of being discharged from the hospital after a code - for a patient with significant co-morbidities such as [name] [name] has - are 2%.  [name] [name] was surprised to learn this statistic.  We also talked about EOL trajectory for CP and aspiration pneumonia.  We  also talked about [name] [name]'s current problem with constipation.  [name] [name] appreciated the information.    Primary Decision Maker:  PATIENT  Medical decision making surrogate is her Father, [name] [name] (not her adult son)    SUMMARY OF RECOMMENDATIONS    Chaplain services requested to create HCPOA (naming [name] [name]), and a living will if [name] [name] wants to complete the living will portion.  She has specified that she would not want to be on life support longer than 2 weeks.  [name] [name] added in addition to miralax and linzess to home bowel regimen.  Patient may need a suppository (manual stimulation) every 2-3 days in order to produce a bowel movement.  Code Status/Advance Care Planning:  Full code  Prognosis:  Unable to determine.  Given her recurrent MDR UTIs she is at high risk for an acute event that will cause rehospitalization.    Discharge Planning: Home with Home Health      Primary Diagnoses: Present on  Admission: . Complicated UTI (urinary tract infection) . Recurrent pulmonary embolism ([name]) . Pressure ulcer . Neurogenic bladder . HYPERTENSION, BENIGN . CP (cerebral palsy), spastic ([name]) . Anemia of chronic disease   I have reviewed the medical record, interviewed the patient and family, and examined the patient. The following aspects are pertinent.  Past Medical History:  Diagnosis Date  . Acute respiratory failure with hypoxia ([name])   . Anemia   . Cellulitis 12/22/2014  . Cerebral palsy ([name])    Spastic Cerebral palsy, mentally intact  . Contracture, joint, multiple sites    Electric wheelchair, uses left hand to operate chair.   . Depression   . [name]   . Dysarthria   . GERD (gastroesophageal reflux disease)   . Gram positive sepsis ([name]) 10/24/2014  . HCAP (healthcare-associated pneumonia)    2015, 2018  . History of endometriosis 10/2012   OBGYN [facility]: Laparoscopic Endometrial Ablation, TVH,  . History of recurrent UTIs   . Hypertension   . Incontinent of feces 03/11/2016  . Intracervical pessary 05/07/2011   Overview:  Overview:  Placed by [facility] 04/30/11 to treat DUB and Endometriosis.  She is seen at GYN clinic at [facility] but was considered a poor surgical candidate and referred her to [facility].  For DUB she had pelvic ultrasound that showed thin stripe and she had endometrial Bx by Dr Jennette Kettle in GYN clinic, which was negative.     . Macroscopic hematuria 03/10/2013   status post replacement of suprapubic tube 03/04/2013   . Migraine   . OCD (obsessive compulsive disorder)   . Parapneumonic effusion 10/25/2014  . Presence of intrathecal baclofen pump 03/07/2014   R LQ. Insertion T10.    [name] Kitchen Psychiatric illness 10/31/2014  . Recurrent pulmonary embolism ([name])    Pt has history of recurrent PE and is on lifelong coumadin.   . Seizures ([name]) 02/12/2016  . Spasticity 01/05/2014  . Transient alteration of awareness, recurrent 09/08/2006   Recurrent  episodes where [name]  loses contact with her world and that have been studied thoroughly and appear nonepileptic in nature per Dr Terrace Arabia (neurology) The patient has had episodes starting in 2007 that initially began 1-4 times a day during which time she would have periods of unawareness followed by confusion. This continuous video EEG monitoring performed from October 8-12, 2007. Had   . Urethra dilated and patulous 07/03/2015   Patulous, Dilated Urethra. 5mL balloon on a 22-Fr catheter hoping this would prevent the bladder spasm from pushing  the balloon down the urethra (Dr Logan Bores, Urology [facility], July 2016)    Social History   Socioeconomic History  . Marital status: Divorced    Spouse name: Not on file  . Number of children: 2  . Years of education: college  . Highest education level: Not on file  Occupational History    Employer: UNEMPLOYED  Social Needs  . Financial resource strain: Not on file  . Food insecurity:    Worry: Not on file    Inability: Not on file  . Transportation needs:    Medical: Not on file    Non-medical: Not on file  Tobacco Use  . Smoking status: Never Smoker  . Smokeless tobacco: Never Used  Substance and Sexual Activity  . Alcohol use: Yes    Alcohol/week: 1.0 standard drinks    Types: 1 Glasses of wine per week    Comment: Drinks alcohol once a month.  . Drug use: No  . Sexual activity: Never  Lifestyle  . Physical activity:    Days per week: Not on file    Minutes per session: Not on file  . Stress: Not on file  Relationships  . Social connections:    Talks on phone: Not on file    Gets together: Not on file    Attends religious service: Not on file    Active member of club or organization: Not on file    Attends meetings of clubs or organizations: Not on file    Relationship status: Not on file  Other Topics Concern  . 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     She enjoys shopping.   Lives in a nursing facility[facility] since 02/11[facility]16      Ailene Ards, who has schizophrenia, MR, is father of daughter.  Homero Fellers and pt are no longer together.  Homero Fellers does not see [name]. Daughter is 38 yo, Levy Sjogren, lives in a group home in [place].    [name]'s mother reportedly has breast cancer.    Needs assistance with ADL's and IADL's--has personal care services 20 hrs/wk;       No tobacco, EtOH, drugs.      **Case Manager: Jack Quarto [phone]      Patient lives [facility]   Family History  Problem Relation Age of Onset  . Asthma Father   . Colon cancer Maternal Grandmother        Died in her 69's  . Breast cancer Paternal Grandmother        Died in her 46's  . Heart attack Maternal Grandfather        Died in his 62's  . Alzheimer's disease Paternal Grandfather        Died in his 64's  . Breast cancer Mother   . Stomach cancer Neg Hx    Scheduled Meds: . amantadine  100 mg Oral Daily  . baclofen  20 mg Oral QID  . Buprenorphine HCl  450 mcg Buccal Q12H  . busPIRone  5 mg Oral TID  . clonazePAM  0.75 mg Oral TID  . ferrous sulfate  325 mg Oral Q breakfast  . FLUoxetine  80 mg Oral Daily  . hydroxychloroquine  200 mg Oral Daily  . lamoTRIgine  100 mg Oral QHS  . linaclotide  145 mcg Oral QAC breakfast  . Melatonin  4.5 mg Oral QHS  . oxybutynin  5 mg  Oral QID  . polyethylene glycol  25.5 g Oral Daily  . QUEtiapine  150 mg Oral QHS  . rivaroxaban  10 mg Oral QPM  . tiZANidine  4 mg Oral BID  . topiramate  100 mg Oral QHS  . venlafaxine XR  37.5 mg Oral Daily   Continuous Infusions: . meropenem (MERREM) IV 1 g (02/16/19 1249)   PRN Meds:.acetaminophen **OR** acetaminophen, alum & mag hydroxide-simeth, guaiFENesin, ondansetron **OR** ondansetron (ZOFRAN) IV, oxyCODONE, sodium phosphate Allergies  Allergen Reactions  . Morphine Dermatitis and Other (See Comments)    Skin turned red    Review  of Systems Constipation, abdominal tenderness, decreased appetite, denies CP, SOB  Physical Exam  Awake, alert, orientated, dysarthric, appropriate. CV rrr resp no distress Abdomen mildly distended, NT, +BS Extremities contracted.  Vital Signs: BP 101/73 (BP Location: Left Arm)   Pulse 79   Temp 98.3 F (36.8 C) (Oral)   Resp 20   Ht  (1.448 m)   Wt 44.2 kg   LMP 04/05/2011   SpO2 98%   BMI 21.09 kg/m  Pain Scale: 0-10   Pain Score: 7    SpO2: SpO2: 98 % O2 Device:SpO2: 98 % O2 Flow Rate: .   IO: Intake/output summary:   Intake/Output Summary (Last 24 hours) at 02/16/2019 1657 Last data filed at 02/16/2019 1300 Gross per 24 hour  Intake 475 ml  Output 2150 ml  Net -1675 ml    LBM: Last BM Date: 02/15/19 Baseline Weight: Weight: 47 kg Most recent weight: Weight: 44.2 kg     Palliative Assessment/Data: 30%   Flowsheet Rows     Most Recent Value  Intake Tab  Referral Department  Hospitalist  Unit at Time of Referral  Intermediate Care Unit  Palliative Care Primary Diagnosis  Sepsis/Infectious Disease  Date Notified  02/15/19  Palliative Care Type  New Palliative care  Reason for referral  Clarify Goals of Care  Date of Admission  02/14/19  # of days IP prior to Palliative referral  1  Clinical Assessment  Psychosocial & Spiritual Assessment  Palliative Care Outcomes      Time In: 4:00 Time Out: 5:10 Time Total: 70 min. Greater than 50%  of this time was spent counseling and coordinating care related to the above assessment and plan.  Signed by: Norvel Richards, PA-C Palliative Medicine Pager: [phone]  Please contact Palliative Medicine Team phone at [phone] for questions and concerns.  For individual provider: See Loretha Stapler

## 2019-02-16 NOTE — TOC Initial Note (Signed)
Transition of Care [facility]) - Initial/Assessment Note    Patient Details  Name: [name] [name] MRN: [id number] Date of Birth: 1976-02-11  Transition of Care [facility]) CM/SW Contact:    Epifanio Lesches, RN Phone Number: 02/16/2019, 8:32 AM  Clinical Narrative:         Admitted with abdominal pain/ UTI. Hx of cerebral palsy with spasticity and chronic pain, recurrent PE. From home with caregiver, Tynega. DME: motorized wheelchair.  Rudell Cobb (Father) Di Kindle [phone]          Expected discharge plan: home with self care. Pt declines home health services . States her caregiver is all she needs.  Barriers to Discharge: Continued Medical Work up   Patient Goals and CMS Choice Patient states their goals for this hospitalization and ongoing recovery are:: to get better and go home CMS Medicare.gov Compare Post Acute Care list provided to:: Patient Choice offered to / list presented to : Patient  Expected Discharge Plan and Services Expected Discharge Plan: Home w Home Health Services Discharge Planning Services: CM Consult   Living arrangements for the past 2 months: Apartment                 DME Arranged: N/A(owns motorized w/c) DME Agency: NA HH Arranged: NA HH Agency: NA  Prior Living Arrangements/Services Living arrangements for the past 2 months: Apartment Lives with:: Other (Comment)(Caregiver and caregiver's child ( 4 yrs old)) Patient language and need for interpreter reviewed:: Yes Do you feel safe going back to the place where you live?: Yes      Need for Family Participation in Patient Care: No (Comment) Care giver support system in place?: Yes (comment)   Criminal Activity/Legal Involvement Pertinent to Current Situation/Hospitalization: No - Comment as needed  Activities of Daily Living      Permission Sought/Granted Permission sought to share information with : Case Manager, Other (comment) Permission granted to  share information with : Yes, Verbal Permission Granted  Share Information with NAME: Rudell Cobb (Father)Tyneqa Crawford (caregiver)           Emotional Assessment Appearance:: Appears stated age Attitude/Demeanor/Rapport: Engaged Affect (typically observed): Accepting Orientation: : Oriented to Self, Oriented to Place, Oriented to  Time, Oriented to Situation Alcohol / Substance Use: Never Used Psych Involvement: No (comment)  Admission diagnosis:  ESBL (extended spectrum beta-lactamase) producing bacteria infection [A49.9, Z16.12] Acute cystitis with hematuria [N30.01] Complicated UTI (urinary tract infection) [N39.0] Patient Active Problem List   Diagnosis Date Noted  . Cough 01/04/2019  . Bad taste in mouth 01/04/2019  . Hypotension 11/19/2018  . Complicated UTI (urinary tract infection) 11/18/2018  . Solitary pulmonary nodule on lung CT 10/28/2018  . Connective tissue disorder ([name]) 08/07/2018  . Migraine without aura and without status migrainosus, not intractable 06/26/2018  . UTI (urinary tract infection) 05/25/2018  . Headache 05/25/2018  . Incontinent of feces 03/11/2016  . Chronic anticoagulation 03/11/2016  . Involuntary movements 02/22/2016  . Seizures ([name]) 02/12/2016  . Dyspnea 02/12/2016  . Cerebral palsy, quadriplegic ([name]) 02/06/2016  . Swelling of extremity 01/13/2016  . Nausea without vomiting 11/09/2015  . Fever   . Pressure ulcer 10/09/2015  . Status post insertion of inferior vena caval filter 09/28/2015  . Absence of bladder continence 08/25/2015  . Person living in residential institution 06/14/2015  . Pulmonary hypertension ([name])   . Anemia of chronic disease   . Spasticity 03/15/2015  . Constipation, chronic 10/10/2014  . Athetoid  cerebral palsy ([name]) 04/13/2014  . Dystonia 04/12/2014  . UTI (lower urinary tract infection) 03/16/2014  . Presence of intrathecal baclofen pump 03/07/2014  . Dental caries 07/18/2013  . Multiple thyroid nodules  05/20/2013  . Osteoarthrosis 04/07/2013  . [name]   . Dysarthria   . GERD (gastroesophageal reflux disease)   . Neurogenic bladder 02/05/2013  . Anxiety state 10/21/2012  . Chronic pain syndrome 06/17/2012  . Recurrent pulmonary embolism ([name]) 05/03/2011  . Pulmonary embolism with infarction ([name]) 05/03/2011  . Chronic suprapubic catheter ([name]) 03/15/2011  . Disease of female genital organs 10/24/2010  . HYPERTENSION, BENIGN 01/31/2010  . CP (cerebral palsy), spastic ([name]) 12/27/2008  . Contracted, joint, multiple sites 12/27/2008  . Major depressive disorder, recurrent episode ([name]) 01/29/2007  . Obsessive Compulsive Disorder 01/29/2007  . Transient alteration of awareness, recurrent 09/08/2006   PCP:  Justin Mend, MD Pharmacy:   [address] - [place], [place] - [address] [place] [address] Phone: [phone] Fax: [phone]  [facility], [place] - [address] [place] [address] Phone: [phone] Fax: [phone]  [id number] [place], [place] - [address] AT [name] [address] [place] [address] Phone: [phone] Fax: [phone]  [facility] - [place], [place] - [address] [place]  [place] [address] Phone: [phone] Fax: [phone]     Social Determinants of Health (SDOH) Interventions    Readmission Risk Interventions  No flowsheet data found.

## 2019-02-16 NOTE — Progress Notes (Signed)
Patient ID: [name] [name], female   DOB: 10/26/1976, 43 y.o.   MRN: [id number]  PROGRESS NOTE    [name] [name]  JJH:[id number] DOB: [birth year] DOA: 02/14/2019 PCP: Justin Mend, MD   Brief Narrative:  43 year old female with history of cerebral palsy with spasticity and chronic pain, recurrent PE presented on 02/14/2019 with abdominal pain.  CT scan of the abdomen showed features concerning for constipation.  There is some mild discharge around the suprapubic catheter which was puslike; catheter was exchanged and changed.  She was started on IV antibiotics for UTI.  Assessment & Plan:   Complicated UTI with history of VRE and ESBL in the urine in the past in a patient with suprapubic catheter  -Apparently there was pus coming out around the suprapubic catheter site.  Catheter was changed in the ED.  Currently on meropenem.  Urine cultures not growing anything so far; follow.  She is currently afebrile, with no leukocytosis.  She might be colonized with multi-drug-resistant bacteria.  Had spoken to Dr. Matilde Bash on phone on 02/15/2019 and we decided to hold off on Zyvox and wait for urine cultures as patient has been hemodynamically stable. -If she remains afebrile till tomorrow and urine cultures do not grow anything, will probably be discharged home tomorrow as she will have completed 3 days of antibiotics treatment.  History of seizures -Continue Lamictal and Topamax  Recurrent PE -Continue Xarelto  Chronic pain -On buprenorphine and oxycodone  History of cerebral palsy with spasticity -On baclofen and amantadine  History of depression -On Seroquel, Effexor, Prozac along with Klonopin  Constipation--continue current regimen with MiraLAX and Linzess.  She feels better after receiving enema on 02/15/2019.  DVT prophylaxis: Xarelto Code Status: Full Family Communication: None at bedside Disposition Plan: Probable discharge tomorrow if remains stable.  Consultants: None   Procedures: None  Antimicrobials: Meropenem from 02/14/2019 onwards   Subjective: Patient seen and examined at bedside.  She is a poor historian.  Slow to respond to questions.  Speech intermittently is incomprehensible.  States that her belly pain is improving after enema yesterday.  No overnight fever or vomiting.   Objective: Vitals:   02/15/19 1449 02/15/19 2119 02/16/19 0500 02/16/19 0547  BP: 96/66 116/77  100/68  Pulse: 82 85  (!) 57  Resp: 18 16  16   Temp: 98.6 F (37 C) 98.5 F (36.9 C)  98.2 F (36.8 C)  TempSrc: Oral Oral  Oral  SpO2: 98% 100%  98%  Weight:   44.2 kg   Height:        Intake/Output Summary (Last 24 hours) at 02/16/2019 1052 Last data filed at 02/16/2019 0601 Gross per 24 hour  Intake 200 ml  Output 2150 ml  Net -1950 ml   Filed Weights   02/14/19 1333 02/15/19 0544 02/16/19 0500  Weight: 47 kg 42.3 kg 44.2 kg    Examination:  General exam: Appears calm and comfortable.  No acute distress.  Looks older than stated age.  Has multiple contractures. Respiratory system: Bilateral decreased breath sounds at bases, scattered crackles Cardiovascular system: S1 & S2 heard, Rate controlled Gastrointestinal system: Abdomen is nondistended, soft and mildly tender in the suprapubic region with suprapubic catheter present.  Normal bowel sounds heard. Extremities: No no edema, cyanosis.  Data Reviewed: I have personally reviewed following labs and imaging studies  CBC: Recent Labs  Lab 02/13/19 1200 02/14/19 1444 02/15/19 0553 02/16/19 0247  WBC 6.3 6.1 6.1 5.1  NEUTROABS 4.7 4.2 3.0 2.0  HGB 13.3 12.9 11.6* 11.2*  HCT 42.3 40.3 36.1 34.0*  MCV 96.4 93.1 94.8 92.9  PLT 228 229 218 198   Basic Metabolic Panel: Recent Labs  Lab 02/13/19 1200 02/14/19 1444 02/15/19 0553 02/16/19 0247  NA 138 138 140 139  K 3.8 4.2 5.4* 3.6  CL 106 108 110 109  CO2 23 23 25 22   GLUCOSE 134* 106* 118* 100*  BUN 13 9 9 8   CREATININE 0.49 0.57 0.69 0.59   CALCIUM 9.2 9.4 8.9 8.8*  MG  --   --   --  2.0   GFR: Estimated Creatinine Clearance: 54.1 mL/min (by C-G formula based on SCr of 0.59 mg/dL). Liver Function Tests: Recent Labs  Lab 02/14/19 1444 02/15/19 0553  AST 14* 15  ALT 12 10  ALKPHOS 78 70  BILITOT 0.5 0.5  PROT 7.0 6.2*  ALBUMIN 3.9 3.4*   No results for input(s): LIPASE, AMYLASE in the last 168 hours. No results for input(s): AMMONIA in the last 168 hours. Coagulation Profile: No results for input(s): INR, PROTIME in the last 168 hours. Cardiac Enzymes: No results for input(s): CKTOTAL, CKMB, CKMBINDEX, TROPONINI in the last 168 hours. BNP (last 3 results) No results for input(s): PROBNP in the last 8760 hours. HbA1C: No results for input(s): HGBA1C in the last 72 hours. CBG: No results for input(s): GLUCAP in the last 168 hours. Lipid Profile: No results for input(s): CHOL, HDL, LDLCALC, TRIG, CHOLHDL, LDLDIRECT in the last 72 hours. Thyroid Function Tests: No results for input(s): TSH, T4TOTAL, FREET4, T3FREE, THYROIDAB in the last 72 hours. Anemia Panel: No results for input(s): VITAMINB12, FOLATE, FERRITIN, TIBC, IRON, RETICCTPCT in the last 72 hours. Sepsis Labs: No results for input(s): PROCALCITON, LATICACIDVEN in the last 168 hours.  Recent Results (from the past 240 hour(s))  Urine Culture     Status: Abnormal   Collection Time: 02/13/19 11:40 AM  Result Value Ref Range Status   Specimen Description   Final    URINE, CLEAN CATCH Performed at [facility], [address] [place], [place] [address]    Special Requests   Final    NONE Performed at [facility], [address] [place], [place] [address]    Culture MULTIPLE SPECIES PRESENT, SUGGEST RECOLLECTION (A)  Final   Report Status 02/15/2019 FINAL  Final  Culture, Urine     Status: None (Preliminary result)   Collection Time: 02/15/19 11:26 AM  Result Value Ref Range Status   Specimen Description  URINE, SUPRAPUBIC  Final   Special Requests NONE  Final   Culture   Final    CULTURE REINCUBATED FOR BETTER GROWTH Performed at [facility], [address] [place], [place] [address]    Report Status PENDING  Incomplete         Radiology Studies: No results found.      Scheduled Meds: . amantadine  100 mg Oral Daily  . baclofen  20 mg Oral QID  . Buprenorphine HCl  450 mcg Buccal Q12H  . busPIRone  5 mg Oral TID  . clonazePAM  0.75 mg Oral TID  . ferrous sulfate  325 mg Oral Q breakfast  . FLUoxetine  80 mg Oral Daily  . hydroxychloroquine  200 mg Oral Daily  . lamoTRIgine  100 mg Oral QHS  . linaclotide  145 mcg Oral QAC breakfast  . Melatonin  4.5 mg Oral QHS  . oxybutynin  5 mg Oral QID  .  polyethylene glycol  25.5 g Oral Daily  . QUEtiapine  150 mg Oral QHS  . rivaroxaban  10 mg Oral QPM  . tiZANidine  4 mg Oral BID  . topiramate  100 mg Oral QHS  . venlafaxine XR  37.5 mg Oral Daily   Continuous Infusions: . meropenem (MERREM) IV 1 g (02/16/19 0553)     LOS: 0 days        Glade Lloyd, MD [facility] 02/16/2019, 10:52 AM

## 2019-02-17 MED ORDER — PHENAZOPYRIDINE HCL 200 MG PO TABS
200.0000 mg | ORAL_TABLET | Freq: Once | ORAL | Status: AC
  Administered 2019-02-17: 200 mg via ORAL
  Filled 2019-02-17: qty 1

## 2019-02-17 MED ORDER — CHLORHEXIDINE GLUCONATE CLOTH 2 % EX PADS
6.0000 | MEDICATED_PAD | Freq: Every day | CUTANEOUS | Status: DC
  Administered 2019-02-17 – 2019-02-18 (×2): 6 via TOPICAL

## 2019-02-17 MED ORDER — MUPIROCIN 2 % EX OINT
1.0000 "application " | TOPICAL_OINTMENT | Freq: Two times a day (BID) | CUTANEOUS | Status: DC
  Administered 2019-02-17 – 2019-02-18 (×3): 1 via NASAL
  Filled 2019-02-17: qty 22

## 2019-02-17 MED ORDER — OXYCODONE HCL 5 MG PO TABS
10.0000 mg | ORAL_TABLET | Freq: Once | ORAL | Status: AC
  Administered 2019-02-17: 10 mg via ORAL
  Filled 2019-02-17: qty 2

## 2019-02-17 NOTE — Progress Notes (Signed)
Responded to spiritual care consult. Called nurse for PT. She stated that she was going to drop off a copy of the AD to Pt. I suggested that they put in an consult if further assistance was needed for the AD.   Chaplain Orest Dikes [phone]

## 2019-02-17 NOTE — Progress Notes (Signed)
Lab called and said pt was MRSA positive in nares.  I put in order set for MRSA positive treatment.

## 2019-02-17 NOTE — Progress Notes (Signed)
   02/17/19 1600  Clinical Encounter Type  Visited With Patient  Visit Type Follow-up  Referral From Nurse  Consult/Referral To Chaplain   Responded to AD follow up. PT did not have a copy of AD. Before she was given one, she asked what it was for. I gave her a copy of it and explained briefly what it was. PT stated that she would like to look it over. Also that she would get a hold of spiritual care in the morning if further assistance would be needed.  Chaplain Orest Dikes [phone]

## 2019-02-17 NOTE — Progress Notes (Signed)
PROGRESS NOTE        PATIENT DETAILS Name: [name] [name] Age: 43 y.o. Sex: female Date of Birth: 19-Mar-1976 Admit Date: 02/14/2019 Admitting Physician Eduard Clos, MD FTD:DUKGUR Bernie Covey, MD  Brief Narrative: Patient is a 43 y.o. female with history of cerebral palsy with spasticity, recurrent VTE presenting with abdominal pain secondary to fecal impaction, and concern for complicated UTI.  Subjective: Complains of some lower abdominal pain-wants to go home.  Assessment/Plan: Complicated UTI: Has a history of multidrug-resistant organisms in the past-has a suprapubic catheter in place-apparently had purulent discharge around the catheter site on admission-continue meropenem-prelim urine cultures growing Proteus-awaiting sensitivity.  Abdominal pain: Suspect secondary to constipation-we will ask RN to give Fleet enema x1.  Continue MiraLAX.  Recurrent PE: Continue with Xarelto  History of seizures: Continue Lamictal and Topamax  Depression/anxiety: Appears to be stable-continue Seroquel, Effexor, Prozac and Klonopin  Chronic pain syndrome: On buprenorphine and oxycodone  DVT Prophylaxis: Full dose anticoagulation with Xarelto  Code Status: Full code   Family Communication: None at bedside  Disposition Plan: Remain inpatient  Antimicrobial agents: Anti-infectives (From admission, onward)   Start     Dose/Rate Route Frequency Ordered Stop   02/15/19 1400  meropenem (MERREM) 1 g in sodium chloride 0.9 % 100 mL IVPB     1 g 200 mL/hr over 30 Minutes Intravenous Every 8 hours 02/15/19 1049     02/15/19 1000  hydroxychloroquine (PLAQUENIL) tablet 200 mg     200 mg Oral Daily 02/14/19 2354     02/14/19 1600  meropenem (MERREM) 1 g in sodium chloride 0.9 % 100 mL IVPB  Status:  Discontinued     1 g 200 mL/hr over 30 Minutes Intravenous Every 12 hours 02/14/19 1547 02/15/19 1049      Procedures: None  CONSULTS:  None  Time  spent: 25- minutes-Greater than 50% of this time was spent in counseling, explanation of diagnosis, planning of further management, and coordination of care.  MEDICATIONS: Scheduled Meds:  amantadine  100 mg Oral Daily   baclofen  20 mg Oral QID   busPIRone  5 mg Oral TID   Chlorhexidine Gluconate Cloth  6 each Topical Q0600   clonazePAM  0.75 mg Oral TID   ferrous sulfate  325 mg Oral Q breakfast   FLUoxetine  80 mg Oral Daily   hydroxychloroquine  200 mg Oral Daily   lamoTRIgine  100 mg Oral QHS   linaclotide  145 mcg Oral QAC breakfast   Melatonin  4.5 mg Oral QHS   mupirocin ointment  1 application Nasal BID   oxybutynin  5 mg Oral QID   polyethylene glycol  25.5 g Oral Daily   QUEtiapine  150 mg Oral QHS   rivaroxaban  10 mg Oral QPM   senna  1 tablet Oral QHS   tiZANidine  4 mg Oral BID   topiramate  100 mg Oral QHS   venlafaxine XR  37.5 mg Oral Daily   Continuous Infusions:  meropenem (MERREM) IV 1 g (02/17/19 1315)   PRN Meds:.acetaminophen **OR** acetaminophen, alum & mag hydroxide-simeth, guaiFENesin, ondansetron **OR** ondansetron (ZOFRAN) IV, oxyCODONE, sodium phosphate   PHYSICAL EXAM: Vital signs: Vitals:   02/16/19 1412 02/16/19 2134 02/17/19 0426 02/17/19 0428  BP: 101/73 105/77 90/62   Pulse: 79 84 75   Resp: 20 16  16   Temp: 98.3 F (36.8 C) (!) 97.2 F (36.2 C) (!) 97.5 F (36.4 C)   TempSrc: Oral Oral Oral   SpO2: 98% 95% 98%   Weight:    46.2 kg  Height:       Filed Weights   02/15/19 0544 02/16/19 0500 02/17/19 0428  Weight: 42.3 kg 44.2 kg 46.2 kg   Body mass index is 22.04 kg/m.   General appearance :Awake, alert, not in any distress.  HEENT: Atraumatic and Normocephalic Neck: supple Resp:Good air entry bilaterally, no added sounds  CVS: S1 S2 regular, no murmurs.  GI: Bowel sounds present, Non tender and not distended with no gaurding, rigidity or rebound.No organomegaly Extremities: B/L Lower Ext shows no  edema, both legs are warm to touch   I have personally reviewed following labs and imaging studies  LABORATORY DATA: CBC: Recent Labs  Lab 02/13/19 1200 02/14/19 1444 02/15/19 0553 02/16/19 0247  WBC 6.3 6.1 6.1 5.1  NEUTROABS 4.7 4.2 3.0 2.0  HGB 13.3 12.9 11.6* 11.2*  HCT 42.3 40.3 36.1 34.0*  MCV 96.4 93.1 94.8 92.9  PLT 228 229 218 198    Basic Metabolic Panel: Recent Labs  Lab 02/13/19 1200 02/14/19 1444 02/15/19 0553 02/16/19 0247  NA 138 138 140 139  K 3.8 4.2 5.4* 3.6  CL 106 108 110 109  CO2 GLUCOSE 134* 106* 118* 100*  BUN CREATININE 0.49 0.57 0.69 0.59  CALCIUM 9.2 9.4 8.9 8.8*  MG  --   --   --  2.0    GFR: Estimated Creatinine Clearance: 54.1 mL/min (by C-G formula based on SCr of 0.59 mg/dL).  Liver Function Tests: Recent Labs  Lab 02/14/19 1444 02/15/19 0553  AST 14* 15  ALT 12 10  ALKPHOS 78 70  BILITOT 0.5 0.5  PROT 7.0 6.2*  ALBUMIN 3.9 3.4*   No results for input(s): LIPASE, AMYLASE in the last 168 hours. No results for input(s): AMMONIA in the last 168 hours.  Coagulation Profile: No results for input(s): INR, PROTIME in the last 168 hours.  Cardiac Enzymes: No results for input(s): CKTOTAL, CKMB, CKMBINDEX, TROPONINI in the last 168 hours.  BNP (last 3 results) No results for input(s): PROBNP in the last 8760 hours.  HbA1C: No results for input(s): HGBA1C in the last 72 hours.  CBG: No results for input(s): GLUCAP in the last 168 hours.  Lipid Profile: No results for input(s): CHOL, HDL, LDLCALC, TRIG, CHOLHDL, LDLDIRECT in the last 72 hours.  Thyroid Function Tests: No results for input(s): TSH, T4TOTAL, FREET4, T3FREE, THYROIDAB in the last 72 hours.  Anemia Panel: No results for input(s): VITAMINB12, FOLATE, FERRITIN, TIBC, IRON, RETICCTPCT in the last 72 hours.  Urine analysis:    Component Value Date/Time   COLORURINE AMBER (A) 02/13/2019 1136   APPEARANCEUR CLOUDY (A) 02/13/2019 1136    LABSPEC 1.010 02/13/2019 1136   PHURINE 9.0 (H) 02/13/2019 1136   GLUCOSEU NEGATIVE 02/13/2019 1136   HGBUR NEGATIVE 02/13/2019 1136   HGBUR large 10/03/2010 1530   BILIRUBINUR NEGATIVE 02/13/2019 1136   BILIRUBINUR neg 03/15/2011 1719   KETONESUR NEGATIVE 02/13/2019 1136   PROTEINUR 30 (A) 02/13/2019 1136   UROBILINOGEN 1.0 10/08/2015 2348   NITRITE NEGATIVE 02/13/2019 1136   LEUKOCYTESUR LARGE (A) 02/13/2019 1136   LEUKOCYTESUR mod 01/26/2014    Sepsis Labs: Lactic Acid, Venous    Component Value Date/Time   LATICACIDVEN 1.08 11/18/2018 1421  MICROBIOLOGY: Recent Results (from the past 240 hour(s))  Urine Culture     Status: Abnormal   Collection Time: 02/13/19 11:40 AM  Result Value Ref Range Status   Specimen Description   Final    URINE, CLEAN CATCH Performed at [facility], [address] [place], [place] [address]    Special Requests   Final    NONE Performed at [facility], [address] [place], [place] [address]    Culture MULTIPLE SPECIES PRESENT, SUGGEST RECOLLECTION (A)  Final   Report Status 02/15/2019 FINAL  Final  Culture, Urine     Status: Abnormal (Preliminary result)   Collection Time: 02/15/19 11:26 AM  Result Value Ref Range Status   Specimen Description URINE, SUPRAPUBIC  Final   Special Requests NONE  Final   Culture (A)  Final    >=100,000 COLONIES/mL ENTEROCOCCUS FAECALIS >=100,000 COLONIES/mL PROTEUS MIRABILIS SUSCEPTIBILITIES TO FOLLOW Performed at [facility], [address] [place], [place] [address]    Report Status PENDING  Incomplete  MRSA PCR Screening     Status: Abnormal   Collection Time: 02/16/19  4:48 PM  Result Value Ref Range Status   MRSA by PCR POSITIVE (A) NEGATIVE Final    Comment:        The GeneXpert MRSA Assay (FDA approved for NASAL specimens only), is one component of a comprehensive MRSA colonization surveillance program. It is not intended to diagnose  MRSA infection nor to guide or monitor treatment for MRSA infections. RESULT CALLED TO, READ BACK BY AND VERIFIED WITHJacinto Halim: S RAMIREZ RN [id number] 02/16/19 A BROWNING Performed at [facility], [address] [place], [place] [address]     RADIOLOGY STUDIES/RESULTS: Ct Abdomen Pelvis W Contrast  Result Date: 02/13/2019 CLINICAL DATA:  Bladder pain.  Leaking suprapubic catheter. EXAM: CT ABDOMEN AND PELVIS WITH CONTRAST TECHNIQUE: Multidetector CT imaging of the abdomen and pelvis was performed using the standard protocol following bolus administration of intravenous contrast. CONTRAST:  75mL OMNIPAQUE IOHEXOL 300 MG/ML  SOLN COMPARISON:  CT scan 08/19/2018 FINDINGS: Lower chest: The lung bases are clear of acute process. No pleural effusion or pulmonary lesions. The heart is normal in size. No pericardial effusion. The distal esophagus and aorta are unremarkable. Hepatobiliary: No focal hepatic lesions or intrahepatic biliary dilatation. The gallbladder is surgically absent. No common bile duct dilatation. Colonic interposition noted. Pancreas: Moderate stable pancreatic atrophy. No mass, inflammation or ductal dilatation. Spleen: Normal size.  No focal lesions. Adrenals/Urinary Tract: The adrenal glands and kidneys are unremarkable and stable. Stable left renal cyst. The bladder contains a suprapubic catheter. Appears to be in good position without complicating features. No pericystic fluid collections or hematoma. Stomach/Bowel: The stomach, duodenum, small bowel and colon are grossly normal in stable. There is a large amount of stool throughout a distended colon along with scattered air-fluid levels. Mild associated small bowel dilatation with scattered air-fluid levels. Findings suggest a persistent diffuse ileus/colonic inertia. Large amount of stool in the rectum suggesting chronic fecal impaction. Vascular/Lymphatic: The aorta is normal in caliber. No dissection. The branch vessels are patent. The  major venous structures are patent. Stable IVC filter. No mesenteric or retroperitoneal mass or adenopathy. Small scattered lymph nodes are noted. Reproductive: Surgically absent. Other: No free abdominal/pelvic fluid collections or free air. No abdominal wall hernia or subcutaneous lesions. Musculoskeletal: No significant bony findings. The spinal cord stimulator is noted. IMPRESSION: 1. Persistent diffuse bowel dilatation likely due to diffuse  ileus/colonic inertia with fecal impaction in the rectum. 2. The suprapubic catheter appears to be well position without complicating features. There is some soft tissue thickening along the course of the catheter which is likely granulation tissue. No abscess or hematoma. Electronically Signed   By: Rudie Meyer M.D.   On: 02/13/2019 14:01     LOS: 1 day   Jeoffrey Massed, MD  [facility]  If 7PM-7AM, please contact night-coverage  Please page via [email or web address]  Go to [email or web address] and use [facility]'s universal password to access. If you do not have the password, please contact the hospital operator.  Locate the [facility] provider you are looking for under [facility] and page to a number that you can be directly reached. If you still have difficulty reaching the provider, please page the [facility] (Director on Call) for the Hospitalists listed on amion for assistance.  02/17/2019, 3:57 PM

## 2019-02-18 LAB — URINE CULTURE: Culture: >=100000 — AB

## 2019-02-18 MED ORDER — CEFDINIR 300 MG PO CAPS: 300.0000 mg | ORAL_CAPSULE | Freq: Two times a day (BID) | ORAL | 0 refills | Status: AC

## 2019-02-18 MED ORDER — AMOXICILLIN 500 MG PO CAPS: 500.0000 mg | ORAL_CAPSULE | Freq: Three times a day (TID) | ORAL | 0 refills | Status: AC

## 2019-02-18 MED ORDER — CEFDINIR 300 MG PO CAPS
300.0000 mg | ORAL_CAPSULE | Freq: Two times a day (BID) | ORAL | Status: DC
  Filled 2019-02-18: qty 1

## 2019-02-18 MED ORDER — AMOXICILLIN 500 MG PO CAPS: 500.0000 mg | ORAL_CAPSULE | Freq: Three times a day (TID) | ORAL | Status: DC

## 2019-02-18 MED FILL — AMOXICILLIN 500 MG CAPS: 500 | 5 days supply | Qty: 15 | Fill #0

## 2019-02-18 MED FILL — CEFDINIR 300 MG CAPSULE: 300 | 5 days supply | Qty: 10 | Fill #0

## 2019-02-18 NOTE — Discharge Planning (Signed)
Nsg Discharge Note  Admit Date:  02/14/2019 Discharge date: 02/18/2019   [name] to be D/C'd Home per MD order.  AVS completed.  Copy for chart, and copy for patient signed, and dated. Patient/caregiver able to verbalize understanding.  Discharge Medication: Allergies as of 02/18/2019      Reactions   Morphine Dermatitis, Other (See Comments)   Skin turned red      Medication List    STOP taking these medications   cephALEXin 500 MG capsule Commonly known as:  KEFLEX     TAKE these medications   acetaminophen 650 MG CR tablet Commonly known as:  Tylenol 8 Hour Take 1 tablet (650 mg total) by mouth every 8 (eight) hours as needed. What changed:    how much to take  when to take this  reasons to take this   amantadine 100 MG capsule Commonly known as:  SYMMETREL Take 100 mg by mouth daily.   amoxicillin 500 MG capsule Commonly known as:  AMOXIL Take 1 capsule (500 mg total) by mouth every 8 (eight) hours for 5 days.   azelastine 0.1 % nasal spray Commonly known as:  ASTELIN Place 2 sprays into both nostrils 2 (two) times daily. Use in each nostril as directed   baclofen 20 MG tablet Commonly known as:  LIORESAL Take 1 tablet (20 mg total) by mouth 4 (four) times daily. For bladder spasms   Buprenorphine HCl 450 MCG Film Commonly known as:  [place] Place 450 mcg inside cheek every 12 (twelve) hours.   busPIRone 5 MG tablet Commonly known as:  BUSPAR Take 5 mg by mouth 3 (three) times daily.   cefdinir 300 MG capsule Commonly known as:  OMNICEF Take 1 capsule (300 mg total) by mouth every 12 (twelve) hours for 5 days.   clonazePAM 0.5 MG tablet Commonly known as:  KLONOPIN Take 1.5 tablets (0.75 mg total) by mouth 3 (three) times daily.   eucerin cream Apply topically as needed for wound care. What changed:    how much to take  when to take this   feeding supplement (ENSURE ENLIVE) Liqd Take 237 mLs by mouth 2 (two) times daily between  meals.   ferrous sulfate 325 (65 FE) MG tablet Take 325 mg by mouth daily with breakfast.   FLUoxetine 40 MG capsule Commonly known as:  PROZAC Take 80 mg by mouth daily.   guaiFENesin 600 MG 12 hr tablet Commonly known as:  MUCINEX Take 600 mg by mouth 2 (two) times daily as needed for cough or to loosen phlegm.   hydroxychloroquine 200 MG tablet Commonly known as:  PLAQUENIL Take 200 mg by mouth daily.   lamoTRIgine 100 MG tablet Commonly known as:  LAMICTAL Take 100 mg by mouth at bedtime.   linaclotide 145 MCG Caps capsule Commonly known as:  LINZESS Take 145 mcg by mouth daily before breakfast.   Melatonin 5 MG Tabs Take 5 mg by mouth at bedtime.   omeprazole 20 MG capsule Commonly known as:  PRILOSEC Take 1 capsule (20 mg total) by mouth daily.   ondansetron 4 MG tablet Commonly known as:  ZOFRAN Take 1 tablet (4 mg total) by mouth every 6 (six) hours as needed for nausea.   oxybutynin 5 MG tablet Commonly known as:  DITROPAN Take 5 mg by mouth 4 (four) times daily.   Oxycodone HCl 10 MG Tabs Take 10 mg by mouth every 6 (six) hours as needed (pain).   polyethylene glycol powder powder  Commonly known as:  GLYCOLAX/MIRALAX Take 1  1/2 dose  daily in 12 ounces of fluid every day. What changed:    how much to take  how to take this  when to take this  additional instructions   QUEtiapine 50 MG tablet Commonly known as:  SEROQUEL Take 150 mg by mouth at bedtime.   rivaroxaban 10 MG Tabs tablet Commonly known as:  XARELTO Take 10 mg by mouth every evening.   tiZANidine 4 MG capsule Commonly known as:  ZANAFLEX Take 1 capsule (4 mg total) by mouth 2 (two) times daily.   topiramate 100 MG tablet Commonly known as:  TOPAMAX Take 1 tablet at bedtime What changed:    how much to take  how to take this  when to take this  additional instructions   venlafaxine XR 37.5 MG 24 hr capsule Commonly known as:  EFFEXOR-XR Take 37.5 mg by mouth  daily.       Discharge Assessment: Vitals:   02/18/19 0640 02/18/19 1000  BP: (!) 69/46 105/77  Pulse: 66   Resp: 18 18  Temp: 98.4 F (36.9 C)   SpO2: 96% 96%   Skin clean, dry and intact without evidence of skin break down, no evidence of skin tears noted. IV catheter discontinued intact. Site without signs and symptoms of complications - no redness or edema noted at insertion site, patient denies c/o pain - only slight tenderness at site.  Dressing with slight pressure applied.  D/c Instructions-Education: Discharge instructions given to patient/family with verbalized understanding. D/c education completed with patient/family including follow up instructions, medication list, d/c activities limitations if indicated, with other d/c instructions as indicated by MD - patient able to verbalize understanding, all questions fully answered. Patient instructed to return to ED, call 911, or call MD for any changes in condition.  Pt Discharge home via PTAR  [place] N Tery Hoeger, [place] 02/18/2019 12:54 PM

## 2019-02-18 NOTE — Consult Note (Signed)
   [facility] CM Inpatient Consult   02/18/2019  [name] [name] 1976-07-12 [id number]   Chart reviewed for extreme high risk score for unplanned readmissions [44%].   Patient evaluated for [facility] Care Management services.  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 Her Primary Care Provider is not a [facility] primary care provider or is not [facility] affiliated, Dr. Virgie Dad ZAPATA.  This patient is Not eligible for [facility] Care Management Services.   Reason:  Not a beneficiary currently attributed to one of the [facility] ACO Registry populations.  Membership roster was used to verify non- eligible status.  For questions, please contact:  Charlesetta Shanks, RN BSN CCM [facility]  [phone] business mobile phone Toll free office [phone]

## 2019-02-18 NOTE — TOC Transition Note (Addendum)
Transition of Care [facility]) - CM/SW Discharge Note   Patient Details  Name: [name] [name] MRN: [id number] Date of Birth: May 30, 1976  [facility] CM/SW Contact:  Epifanio Lesches, RN Phone Number: 02/18/2019, 11:52 AM   Clinical Narrative:    Pt transitioning to home today. PT[facility] called and pt pick up arranged for transportation to home. Caregiver to receive pt, address confirmed.     Barriers to Discharge: Continued Medical Work up   Patient Goals and CMS Choice Patient states their goals for this hospitalization and ongoing recovery are:: to get better and go home CMS Medicare.gov Compare Post Acute Care list provided to:: Patient Choice offered to / list presented to : Patient  Discharge Placement                       Discharge Plan and Services   Discharge Planning Services: CM Consult            DME Arranged: N/A(owns motorized w/c) DME Agency: NA HH Arranged: NA HH Agency: NA   Social Determinants of Health (SDOH) Interventions     Readmission Risk Interventions No flowsheet data found.

## 2019-02-18 NOTE — Discharge Summary (Signed)
PATIENT DETAILS Name: [name] [name] Age: 43 y.o. Sex: female Date of Birth: 26-Oct-1976 MRN: [id number]. Admitting Physician: Eduard Clos, MD [id number] Bernie Covey, MD  Admit Date: 02/14/2019 Discharge date: 02/18/2019  Recommendations for Outpatient Follow-up:  1. Follow up with PCP in 1-2 weeks 2. Please obtain BMP/CBC in one week  Admitted From:  Home  Disposition: Home   Home Health: No  Equipment/Devices: None  Discharge Condition: Stable  CODE STATUS: FULL CODE  Diet recommendation:  Heart Healthy  Brief Summary: See H&P, Labs, Consult and Test reports for all details in brief, Patient is a 43 y.o. female with history of cerebral palsy with spasticity, recurrent VTE presenting with abdominal pain secondary to fecal impaction, and concern for complicated UTI  Brief Hospital Course: Complicated UTI: Has a history of multidrug-resistant organisms in the past-has a suprapubic catheter in place-apparently had purulent discharge around the catheter site on admission-urine culture positive for enterococcus and Proteus-after discussion with pharmacy-we will transition to amoxicillin and cefdinir on discharge.  Afebrile-nontoxic-appearing-and stable for discharge this morning.    Abdominal pain: Suspect secondary to constipation-resolved-required enema.  Continue MiraLAX on discharge.  Recurrent PE: Continue with Xarelto  History of seizures: Continue Lamictal and Topamax  Depression/anxiety: Appears to be stable-continue Seroquel, Effexor, Prozac and Klonopin  Chronic pain syndrome: On buprenorphine and oxycodone  Procedures/Studies: None  Discharge Diagnoses:  Principal Problem:   Complicated UTI (urinary tract infection) Active Problems:   CP (cerebral palsy), spastic ([name])   Recurrent pulmonary embolism ([name])   Neurogenic bladder   HYPERTENSION, BENIGN   Anemia of chronic disease   Pressure ulcer   Seizures ([name])   Palliative care  encounter   Discharge Instructions:  Activity:  As tolerated with Full fall precautions use walker/cane & assistance as needed   Discharge Instructions    Diet - low sodium heart healthy   Complete by:  As directed    Discharge instructions   Complete by:  As directed    Follow with Primary MD  Durenda Hurt, Flossie Buffy, MD in 1 week  Please get a complete blood count and chemistry panel checked by your Primary MD at your next visit, and again as instructed by your Primary MD.  Get Medicines reviewed and adjusted: Please take all your medications with you for your next visit with your Primary MD  Laboratory/radiological data: Please request your Primary MD to go over all hospital tests and procedure/radiological results at the follow up, please ask your Primary MD to get all Hospital records sent to his/her office.  In some cases, they will be blood work, cultures and biopsy results pending at the time of your discharge. Please request that your primary care M.D. follows up on these results.  Also Note the following: If you experience worsening of your admission symptoms, develop shortness of breath, life threatening emergency, suicidal or homicidal thoughts you must seek medical attention immediately by calling 911 or calling your MD immediately  if symptoms less severe.  You must read complete instructions/literature along with all the possible adverse reactions/side effects for all the Medicines you take and that have been prescribed to you. Take any new Medicines after you have completely understood and accpet all the possible adverse reactions/side effects.   Do not drive when taking Pain medications or sleeping medications (Benzodaizepines)  Do not take more than prescribed Pain, Sleep and Anxiety Medications. It is not advisable to combine anxiety,sleep and pain medications without talking with your primary care  practitioner  Special Instructions: If you have smoked or chewed  Tobacco  in the last 2 yrs please stop smoking, stop any regular Alcohol  and or any Recreational drug use.  Wear Seat belts while driving.  Please note: You were cared for by a hospitalist during your hospital stay. Once you are discharged, your primary care physician will handle any further medical issues. Please note that NO REFILLS for any discharge medications will be authorized once you are discharged, as it is imperative that you return to your primary care physician (or establish a relationship with a primary care physician if you do not have one) for your post hospital discharge needs so that they can reassess your need for medications and monitor your lab values.   Increase activity slowly   Complete by:  As directed      Allergies as of 02/18/2019      Reactions   Morphine Dermatitis, Other (See Comments)   Skin turned red      Medication List    STOP taking these medications   cephALEXin 500 MG capsule Commonly known as:  KEFLEX     TAKE these medications   acetaminophen 650 MG CR tablet Commonly known as:  Tylenol 8 Hour Take 1 tablet (650 mg total) by mouth every 8 (eight) hours as needed. What changed:    how much to take  when to take this  reasons to take this   amantadine 100 MG capsule Commonly known as:  SYMMETREL Take 100 mg by mouth daily.   amoxicillin 500 MG capsule Commonly known as:  AMOXIL Take 1 capsule (500 mg total) by mouth every 8 (eight) hours for 5 days.   azelastine 0.1 % nasal spray Commonly known as:  ASTELIN Place 2 sprays into both nostrils 2 (two) times daily. Use in each nostril as directed   baclofen 20 MG tablet Commonly known as:  LIORESAL Take 1 tablet (20 mg total) by mouth 4 (four) times daily. For bladder spasms   Buprenorphine HCl 450 MCG Film Commonly known as:  Belbuca Place 450 mcg inside cheek every 12 (twelve) hours.   busPIRone 5 MG tablet Commonly known as:  BUSPAR Take 5 mg by mouth 3 (three) times  daily.   cefdinir 300 MG capsule Commonly known as:  OMNICEF Take 1 capsule (300 mg total) by mouth every 12 (twelve) hours for 5 days.   clonazePAM 0.5 MG tablet Commonly known as:  KLONOPIN Take 1.5 tablets (0.75 mg total) by mouth 3 (three) times daily.   eucerin cream Apply topically as needed for wound care. What changed:    how much to take  when to take this   feeding supplement (ENSURE ENLIVE) Liqd Take 237 mLs by mouth 2 (two) times daily between meals.   ferrous sulfate 325 (65 FE) MG tablet Take 325 mg by mouth daily with breakfast.   FLUoxetine 40 MG capsule Commonly known as:  PROZAC Take 80 mg by mouth daily.   guaiFENesin 600 MG 12 hr tablet Commonly known as:  MUCINEX Take 600 mg by mouth 2 (two) times daily as needed for cough or to loosen phlegm.   hydroxychloroquine 200 MG tablet Commonly known as:  PLAQUENIL Take 200 mg by mouth daily.   lamoTRIgine 100 MG tablet Commonly known as:  LAMICTAL Take 100 mg by mouth at bedtime.   linaclotide 145 MCG Caps capsule Commonly known as:  LINZESS Take 145 mcg by mouth daily before breakfast.   Melatonin  5 MG Tabs Take 5 mg by mouth at bedtime.   omeprazole 20 MG capsule Commonly known as:  PRILOSEC Take 1 capsule (20 mg total) by mouth daily.   ondansetron 4 MG tablet Commonly known as:  ZOFRAN Take 1 tablet (4 mg total) by mouth every 6 (six) hours as needed for nausea.   oxybutynin 5 MG tablet Commonly known as:  DITROPAN Take 5 mg by mouth 4 (four) times daily.   Oxycodone HCl 10 MG Tabs Take 10 mg by mouth every 6 (six) hours as needed (pain).   polyethylene glycol powder powder Commonly known as:  GLYCOLAX/MIRALAX Take 1  1/2 dose  daily in 12 ounces of fluid every day. What changed:    how much to take  how to take this  when to take this  additional instructions   QUEtiapine 50 MG tablet Commonly known as:  SEROQUEL Take 150 mg by mouth at bedtime.   rivaroxaban 10 MG Tabs  tablet Commonly known as:  XARELTO Take 10 mg by mouth every evening.   tiZANidine 4 MG capsule Commonly known as:  ZANAFLEX Take 1 capsule (4 mg total) by mouth 2 (two) times daily.   topiramate 100 MG tablet Commonly known as:  TOPAMAX Take 1 tablet at bedtime What changed:    how much to take  how to take this  when to take this  additional instructions   venlafaxine XR 37.5 MG 24 hr capsule Commonly known as:  EFFEXOR-XR Take 37.5 mg by mouth daily.      Follow-up Information    Durenda Hurt, Flossie Buffy, MD. Schedule an appointment as soon as possible for a visit in 1 week(s).   Specialty:  Internal Medicine Contact information: [address] [place] [address] [phone]          Allergies  Allergen Reactions   Morphine Dermatitis and Other (See Comments)    Skin turned red     Consultations:   None   Other Procedures/Studies: Ct Abdomen Pelvis W Contrast  Result Date: 02/13/2019 CLINICAL DATA:  Bladder pain.  Leaking suprapubic catheter. EXAM: CT ABDOMEN AND PELVIS WITH CONTRAST TECHNIQUE: Multidetector CT imaging of the abdomen and pelvis was performed using the standard protocol following bolus administration of intravenous contrast. CONTRAST:  75mL OMNIPAQUE IOHEXOL 300 MG/ML  SOLN COMPARISON:  CT scan 08/19/2018 FINDINGS: Lower chest: The lung bases are clear of acute process. No pleural effusion or pulmonary lesions. The heart is normal in size. No pericardial effusion. The distal esophagus and aorta are unremarkable. Hepatobiliary: No focal hepatic lesions or intrahepatic biliary dilatation. The gallbladder is surgically absent. No common bile duct dilatation. Colonic interposition noted. Pancreas: Moderate stable pancreatic atrophy. No mass, inflammation or ductal dilatation. Spleen: Normal size.  No focal lesions. Adrenals/Urinary Tract: The adrenal glands and kidneys are unremarkable and stable. Stable left renal cyst. The bladder contains  a suprapubic catheter. Appears to be in good position without complicating features. No pericystic fluid collections or hematoma. Stomach/Bowel: The stomach, duodenum, small bowel and colon are grossly normal in stable. There is a large amount of stool throughout a distended colon along with scattered air-fluid levels. Mild associated small bowel dilatation with scattered air-fluid levels. Findings suggest a persistent diffuse ileus/colonic inertia. Large amount of stool in the rectum suggesting chronic fecal impaction. Vascular/Lymphatic: The aorta is normal in caliber. No dissection. The branch vessels are patent. The major venous structures are patent. Stable IVC filter. No mesenteric or retroperitoneal mass or adenopathy.  Small scattered lymph nodes are noted. Reproductive: Surgically absent. Other: No free abdominal/pelvic fluid collections or free air. No abdominal wall hernia or subcutaneous lesions. Musculoskeletal: No significant bony findings. The spinal cord stimulator is noted. IMPRESSION: 1. Persistent diffuse bowel dilatation likely due to diffuse ileus/colonic inertia with fecal impaction in the rectum. 2. The suprapubic catheter appears to be well position without complicating features. There is some soft tissue thickening along the course of the catheter which is likely granulation tissue. No abscess or hematoma. Electronically Signed   By: Rudie Meyer M.D.   On: 02/13/2019 14:01      TODAY-DAY OF DISCHARGE:  Subjective:   [name] [name] today has no headache,no chest abdominal pain,no new weakness tingling or numbness, feels much better wants to go home today.   Objective:   Blood pressure (!) 69/46, pulse 66, temperature 98.4 F (36.9 C), temperature source Oral, resp. rate 18, height 4\' 9"  (1.448 m), weight 46.2 kg, last menstrual period 04/05/2011, SpO2 96 %.  Intake/Output Summary (Last 24 hours) at 02/18/2019 1005 Last data filed at 02/17/2019 1845 Gross per 24 hour  Intake  620.06 ml  Output 1600 ml  Net -979.94 ml   Filed Weights   02/15/19 0544 02/16/19 0500 02/17/19 0428  Weight: 42.3 kg 44.2 kg 46.2 kg    Exam: Awake Alert, Oriented *3, No new F.N deficits, Normal affect [place].AT,PERRAL Supple Neck,No JVD, No cervical lymphadenopathy appriciated.  Symmetrical Chest wall movement, Good air movement bilaterally, CTAB RRR,No Gallops,Rubs or new Murmurs, No Parasternal Heave +ve B.Sounds, Abd Soft, Non tender, No organomegaly appriciated, No rebound -guarding or rigidity. No Cyanosis, Clubbing or edema, No new Rash or bruise   PERTINENT RADIOLOGIC STUDIES: Ct Abdomen Pelvis W Contrast  Result Date: 02/13/2019 CLINICAL DATA:  Bladder pain.  Leaking suprapubic catheter. EXAM: CT ABDOMEN AND PELVIS WITH CONTRAST TECHNIQUE: Multidetector CT imaging of the abdomen and pelvis was performed using the standard protocol following bolus administration of intravenous contrast. CONTRAST:  63mL OMNIPAQUE IOHEXOL 300 MG/ML  SOLN COMPARISON:  CT scan 08/19/2018 FINDINGS: Lower chest: The lung bases are clear of acute process. No pleural effusion or pulmonary lesions. The heart is normal in size. No pericardial effusion. The distal esophagus and aorta are unremarkable. Hepatobiliary: No focal hepatic lesions or intrahepatic biliary dilatation. The gallbladder is surgically absent. No common bile duct dilatation. Colonic interposition noted. Pancreas: Moderate stable pancreatic atrophy. No mass, inflammation or ductal dilatation. Spleen: Normal size.  No focal lesions. Adrenals/Urinary Tract: The adrenal glands and kidneys are unremarkable and stable. Stable left renal cyst. The bladder contains a suprapubic catheter. Appears to be in good position without complicating features. No pericystic fluid collections or hematoma. Stomach/Bowel: The stomach, duodenum, small bowel and colon are grossly normal in stable. There is a large amount of stool throughout a distended colon along with  scattered air-fluid levels. Mild associated small bowel dilatation with scattered air-fluid levels. Findings suggest a persistent diffuse ileus/colonic inertia. Large amount of stool in the rectum suggesting chronic fecal impaction. Vascular/Lymphatic: The aorta is normal in caliber. No dissection. The branch vessels are patent. The major venous structures are patent. Stable IVC filter. No mesenteric or retroperitoneal mass or adenopathy. Small scattered lymph nodes are noted. Reproductive: Surgically absent. Other: No free abdominal/pelvic fluid collections or free air. No abdominal wall hernia or subcutaneous lesions. Musculoskeletal: No significant bony findings. The spinal cord stimulator is noted. IMPRESSION: 1. Persistent diffuse bowel dilatation likely due to diffuse ileus/colonic inertia with fecal impaction  in the rectum. 2. The suprapubic catheter appears to be well position without complicating features. There is some soft tissue thickening along the course of the catheter which is likely granulation tissue. No abscess or hematoma. Electronically Signed   By: Rudie Meyer M.D.   On: 02/13/2019 14:01     PERTINENT LAB RESULTS: CBC: Recent Labs    02/16/19 0247  WBC 5.1  HGB 11.2*  HCT 34.0*  PLT 198   CMET CMP     Component Value Date/Time   NA 139 02/16/2019 0247   NA 140 03/15/2016   K 3.6 02/16/2019 0247   CL 109 02/16/2019 0247   CO2 22 02/16/2019 0247   GLUCOSE 100 (H) 02/16/2019 0247   BUN 8 02/16/2019 0247   BUN 9 03/15/2016   CREATININE 0.59 02/16/2019 0247   CREATININE 0.75 05/10/2011 1218   CALCIUM 8.8 (L) 02/16/2019 0247   PROT 6.2 (L) 02/15/2019 0553   ALBUMIN 3.4 (L) 02/15/2019 0553   AST 15 02/15/2019 0553   ALT 10 02/15/2019 0553   ALKPHOS 70 02/15/2019 0553   BILITOT 0.5 02/15/2019 0553   GFRNONAA >60 02/16/2019 0247   GFRAA >60 02/16/2019 0247    GFR Estimated Creatinine Clearance: 54.1 mL/min (by C-G formula based on SCr of 0.59 mg/dL). No results  for input(s): LIPASE, AMYLASE in the last 72 hours. No results for input(s): CKTOTAL, CKMB, CKMBINDEX, TROPONINI in the last 72 hours. Invalid input(s): POCBNP No results for input(s): DDIMER in the last 72 hours. No results for input(s): HGBA1C in the last 72 hours. No results for input(s): CHOL, HDL, LDLCALC, TRIG, CHOLHDL, LDLDIRECT in the last 72 hours. No results for input(s): TSH, T4TOTAL, T3FREE, THYROIDAB in the last 72 hours.  Invalid input(s): FREET3 No results for input(s): VITAMINB12, FOLATE, FERRITIN, TIBC, IRON, RETICCTPCT in the last 72 hours. Coags: No results for input(s): INR in the last 72 hours.  Invalid input(s): PT Microbiology: Recent Results (from the past 240 hour(s))  Urine Culture     Status: Abnormal   Collection Time: 02/13/19 11:40 AM  Result Value Ref Range Status   Specimen Description   Final    URINE, CLEAN CATCH Performed at [facility], [address], [place], [place] [address]    Special Requests   Final    NONE Performed at [facility], [address], [place], [place] [address]    Culture MULTIPLE SPECIES PRESENT, SUGGEST RECOLLECTION (A)  Final   Report Status 02/15/2019 FINAL  Final  Culture, Urine     Status: Abnormal   Collection Time: 02/15/19 11:26 AM  Result Value Ref Range Status   Specimen Description URINE, SUPRAPUBIC  Final   Special Requests   Final    NONE Performed at [facility], [address], [place], [place] [address]    Culture (A)  Final    >=100,000 COLONIES/mL ENTEROCOCCUS FAECALIS >=100,000 COLONIES/mL PROTEUS MIRABILIS    Report Status 02/18/2019 FINAL  Final   Organism ID, Bacteria ENTEROCOCCUS FAECALIS (A)  Final   Organism ID, Bacteria PROTEUS MIRABILIS (A)  Final      Susceptibility   Enterococcus faecalis - MIC*    AMPICILLIN <=2 SENSITIVE Sensitive     LEVOFLOXACIN 1 SENSITIVE Sensitive     NITROFURANTOIN <=16 SENSITIVE Sensitive     VANCOMYCIN 1  SENSITIVE Sensitive     * >=100,000 COLONIES/mL ENTEROCOCCUS FAECALIS   Proteus mirabilis - MIC*    AMPICILLIN >=32 RESISTANT Resistant  CEFAZOLIN 16 SENSITIVE Sensitive     CEFTRIAXONE <=1 SENSITIVE Sensitive     CIPROFLOXACIN >=4 RESISTANT Resistant     GENTAMICIN <=1 SENSITIVE Sensitive     IMIPENEM 8 INTERMEDIATE Intermediate     NITROFURANTOIN 128 RESISTANT Resistant     TRIMETH/SULFA >=320 RESISTANT Resistant     AMPICILLIN/SULBACTAM >=32 RESISTANT Resistant     PIP/TAZO <=4 SENSITIVE Sensitive     * >=100,000 COLONIES/mL PROTEUS MIRABILIS  MRSA PCR Screening     Status: Abnormal   Collection Time: 02/16/19  4:48 PM  Result Value Ref Range Status   MRSA by PCR POSITIVE (A) NEGATIVE Final    Comment:        The GeneXpert MRSA Assay (FDA approved for NASAL specimens only), is one component of a comprehensive MRSA colonization surveillance program. It is not intended to diagnose MRSA infection nor to guide or monitor treatment for MRSA infections. RESULT CALLED TO, READ BACK BY AND VERIFIED WITHJacinto Halim RN [id number] 02/16/19 A BROWNING Performed at [facility], [address], [place], [place] [address]     FURTHER DISCHARGE INSTRUCTIONS:  Get Medicines reviewed and adjusted: Please take all your medications with you for your next visit with your Primary MD  Laboratory/radiological data: Please request your Primary MD to go over all hospital tests and procedure/radiological results at the follow up, please ask your Primary MD to get all Hospital records sent to his/her office.  In some cases, they will be blood work, cultures and biopsy results pending at the time of your discharge. Please request that your primary care M.D. goes through all the records of your hospital data and follows up on these results.  Also Note the following: If you experience worsening of your admission symptoms, develop shortness of breath, life threatening emergency, suicidal or  homicidal thoughts you must seek medical attention immediately by calling 911 or calling your MD immediately  if symptoms less severe.  You must read complete instructions/literature along with all the possible adverse reactions/side effects for all the Medicines you take and that have been prescribed to you. Take any new Medicines after you have completely understood and accpet all the possible adverse reactions/side effects.   Do not drive when taking Pain medications or sleeping medications (Benzodaizepines)  Do not take more than prescribed Pain, Sleep and Anxiety Medications. It is not advisable to combine anxiety,sleep and pain medications without talking with your primary care practitioner  Special Instructions: If you have smoked or chewed Tobacco  in the last 2 yrs please stop smoking, stop any regular Alcohol  and or any Recreational drug use.  Wear Seat belts while driving.  Please note: You were cared for by a hospitalist during your hospital stay. Once you are discharged, your primary care physician will handle any further medical issues. Please note that NO REFILLS for any discharge medications will be authorized once you are discharged, as it is imperative that you return to your primary care physician (or establish a relationship with a primary care physician if you do not have one) for your post hospital discharge needs so that they can reassess your need for medications and monitor your lab values.  Total Time spent coordinating discharge including counseling, education and face to face time equals 35 minutes.  SignedJeoffrey Massed 02/18/2019 10:05 AM

## 2019-03-09 ENCOUNTER — Ambulatory Visit: Payer: Medicare Other | Admitting: Family Medicine

## 2019-03-25 ENCOUNTER — Other Ambulatory Visit: Payer: Self-pay

## 2019-03-25 ENCOUNTER — Emergency Department (HOSPITAL_COMMUNITY)
Admission: EM | Admit: 2019-03-25 | Discharge: 2019-03-25 | Disposition: A | Discharge status: Home / Self Care | Payer: Medicare Other | Attending: Emergency Medicine | Admitting: Emergency Medicine

## 2019-03-25 ENCOUNTER — Encounter (HOSPITAL_COMMUNITY): Payer: Self-pay

## 2019-03-25 DIAGNOSIS — Z79899 Other long term (current) drug therapy: Secondary | ICD-10-CM | POA: Insufficient documentation

## 2019-03-25 DIAGNOSIS — G800 Spastic quadriplegic cerebral palsy: Secondary | ICD-10-CM | POA: Insufficient documentation

## 2019-03-25 DIAGNOSIS — Z7901 Long term (current) use of anticoagulants: Secondary | ICD-10-CM | POA: Insufficient documentation

## 2019-03-25 DIAGNOSIS — R319 Hematuria, unspecified: Secondary | ICD-10-CM | POA: Diagnosis present

## 2019-03-25 DIAGNOSIS — N390 Urinary tract infection, site not specified: Secondary | ICD-10-CM | POA: Insufficient documentation

## 2019-03-25 DIAGNOSIS — I10 Essential (primary) hypertension: Secondary | ICD-10-CM | POA: Diagnosis not present

## 2019-03-25 DIAGNOSIS — T83510A Infection and inflammatory reaction due to cystostomy catheter, initial encounter: Secondary | ICD-10-CM | POA: Diagnosis not present

## 2019-03-25 DIAGNOSIS — Y733 Surgical instruments, materials and gastroenterology and urology devices (including sutures) associated with adverse incidents: Secondary | ICD-10-CM | POA: Diagnosis not present

## 2019-03-25 LAB — COMPREHENSIVE METABOLIC PANEL
ALT: 10 U/L (ref 0–44)
AST: 14 U/L — ABNORMAL LOW (ref 15–41)
Albumin: 3.8 g/dL (ref 3.5–5.0)
Alkaline Phosphatase: 68 U/L (ref 38–126)
Anion gap: 5 (ref 5–15)
BUN: 11 mg/dL (ref 6–20)
CO2: 22 mmol/L (ref 22–32)
Calcium: 8.5 mg/dL — ABNORMAL LOW (ref 8.9–10.3)
Chloride: 113 mmol/L — ABNORMAL HIGH (ref 98–111)
Creatinine, Ser: 0.61 mg/dL (ref 0.44–1.00)
GFR calc Af Amer: >60 mL/min (ref >60)
GFR calc non Af Amer: >60 mL/min (ref >60)
Glucose, Bld: 100 mg/dL — ABNORMAL HIGH (ref 70–99)
Potassium: 4 mmol/L (ref 3.5–5.1)
Sodium: 140 mmol/L (ref 135–145)
Total Bilirubin: 0.7 mg/dL (ref 0.3–1.2)
Total Protein: 6.7 g/dL (ref 6.5–8.1)

## 2019-03-25 LAB — CBC WITH DIFFERENTIAL/PLATELET
Abs Immature Granulocytes: 0.01 10*3/uL (ref 0.00–0.07)
Basophils Absolute: 0 10*3/uL (ref 0.0–0.1)
Basophils Relative: 1 %
Eosinophils Absolute: 0.2 10*3/uL (ref 0.0–0.5)
Eosinophils Relative: 4 %
HCT: 37.6 % (ref 36.0–46.0)
Hemoglobin: 12.1 g/dL (ref 12.0–15.0)
Immature Granulocytes: 0 %
Lymphocytes Relative: 37 %
Lymphs Abs: 1.7 10*3/uL (ref 0.7–4.0)
MCH: 31 pg (ref 26.0–34.0)
MCHC: 32.2 g/dL (ref 30.0–36.0)
MCV: 96.4 fL (ref 80.0–100.0)
Monocytes Absolute: 0.4 10*3/uL (ref 0.1–1.0)
Monocytes Relative: 10 %
Neutro Abs: 2.2 10*3/uL (ref 1.7–7.7)
Neutrophils Relative %: 48 %
Platelets: 209 10*3/uL (ref 150–400)
RBC: 3.9 MIL/uL (ref 3.87–5.11)
RDW: 13.3 % (ref 11.5–15.5)
WBC: 4.6 10*3/uL (ref 4.0–10.5)
nRBC: 0 % (ref 0.0–0.2)

## 2019-03-25 LAB — URINALYSIS, ROUTINE W REFLEX MICROSCOPIC
Glucose, UA: NEGATIVE mg/dL
Hgb urine dipstick: NEGATIVE
Ketones, ur: NEGATIVE mg/dL
Nitrite: NEGATIVE
Protein, ur: 100 mg/dL — AB
Specific Gravity, Urine: 1.015 (ref 1.005–1.030)
pH: 8 (ref 5.0–8.0)

## 2019-03-25 MED ORDER — SODIUM CHLORIDE 0.9 % IV SOLN
1.0000 g | Freq: Once | INTRAVENOUS | Status: AC
  Administered 2019-03-25: 1 g via INTRAVENOUS
  Filled 2019-03-25: qty 10

## 2019-03-25 MED ORDER — CEPHALEXIN 500 MG PO CAPS: 500.0000 mg | ORAL_CAPSULE | Freq: Two times a day (BID) | ORAL | 0 refills | Status: AC

## 2019-03-25 NOTE — Discharge Instructions (Signed)
Please be sure to speak with your physician regarding today's evaluation, and the need for appropriate follow-up care.  Return here for concerning changes in your condition.

## 2019-03-25 NOTE — ED Provider Notes (Signed)
[place] DEPT Provider Note   CSN: [id number] Arrival date & time: 03/25/19  1705    History   Chief Complaint Chief Complaint  Patient presents with   cath issues    HPI [name] [name] is a 43 y.o. female.  HPI Patient with multiple medical issues including cerebral palsy, chronic suprapubic catheter presents with concern of bloody urine from her catheter. The patient has some mild abdominal pain, though this is only elicited this as a concern during review of systems consideration. She initially complains essentially only of bloody discharge from her catheter, has concern of UTI. Acknowledges her multiple medical issues, states that beyond the bloody urine, she has some questions about pressure sore on her posterior. She denies fever, vomiting She seems to be taking all medication as directed.  Past Medical History:  Diagnosis Date   Acute respiratory failure with hypoxia ([name])    Anemia    Cellulitis 12/22/2014   Cerebral palsy ([name])    Spastic Cerebral palsy, mentally intact   Contracture, joint, multiple sites    Electric wheelchair, uses left hand to operate chair.    Depression    [name]    Dysarthria    GERD (gastroesophageal reflux disease)    Gram positive sepsis ([place]) 10/24/2014   HCAP (healthcare-associated pneumonia)    2015, 2018   History of endometriosis 10/2012   OBGYN [facility]: Laparoscopic Endometrial Ablation, TVH,   History of recurrent UTIs    Hypertension    Incontinent of feces 03/11/2016   Intracervical pessary 05/07/2011   Overview:  Overview:  Placed by [place] 04/30/11 to treat DUB and Endometriosis.  She is seen at GYN clinic at [facility] but was considered a poor surgical candidate and referred her to [facility]  For DUB she had pelvic ultrasound that showed thin stripe and she had endometrial Bx by Dr Nori Riis in [place] clinic, which was negative.      Macroscopic hematuria  03/10/2013   status post replacement of suprapubic tube 03/04/2013    Migraine    OCD (obsessive compulsive disorder)    Parapneumonic effusion 10/25/2014   Presence of intrathecal baclofen pump 03/07/2014   R LQ. Insertion T10.     Psychiatric illness 10/31/2014   Recurrent pulmonary embolism ([name])    Pt has history of recurrent PE and is on lifelong coumadin.    Seizures ([place]) 02/12/2016   Spasticity 01/05/2014   Transient alteration of awareness, recurrent 09/08/2006   Recurrent episodes where [place]  loses contact with her world and that have been studied thoroughly and appear nonepileptic in nature per Dr Krista Blue (neurology) The patient has had episodes starting in 2007 that initially began 1-4 times a day during which time she would have periods of unawareness followed by confusion. This continuous video EEG monitoring performed from October 8-12, 2007. Had    Urethra dilated and patulous 07/03/2015   Patulous, Dilated Urethra. 52m balloon on a 22-Fr catheter hoping this would prevent the bladder spasm from pushing the balloon down the urethra (Dr EAmalia [facility]iley [facility], July 2016)     Patient Active Problem List   Diagnosis Date Noted   Palliative care encounter    Cough 01/04/2019   Bad taste in mouth 01/04/2019   Hypotension 100/37/0488  Complicated UTI (urinary tract infection) 11/18/2018   Solitary pulmonary nodule on lung CT 10/28/2018   [place] 08/07/2018   Migraine without aura and without status migrainosus, not intractable  06/26/2018   UTI (urinary tract infection) 05/25/2018   Headache 05/25/2018   Incontinent of feces 03/11/2016   Chronic anticoagulation 03/11/2016   Involuntary movements 02/22/2016   Seizures ([place]) 02/12/2016   Dyspnea 02/12/2016   Cerebral palsy, quadriplegic ([place]) 02/06/2016   Swelling of extremity 01/13/2016   Nausea without vomiting 11/09/2015   Fever    Pressure ulcer 10/09/2015   Status post  insertion of inferior vena caval filter 09/28/2015   Absence of bladder continence 08/25/2015   Person living in residential institution 06/14/2015   Pulmonary hypertension ([place])    Anemia of chronic disease    Spasticity 03/15/2015   Constipation, chronic 10/10/2014   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   Dystonia 04/12/2014   UTI (lower urinary tract infection) 03/16/2014   Presence of intrathecal baclofen pump 03/07/2014   Dental caries 07/18/2013   Multiple thyroid nodules 05/20/2013   Osteoarthrosis 04/07/2013   [name]    Dysarthria    GERD (gastroesophageal reflux disease)    Neurogenic bladder 02/05/2013   Anxiety state 10/21/2012   Chronic pain syndrome 06/17/2012   Recurrent pulmonary embolism ([place]) 05/03/2011   Pulmonary embolism with infarction ([place]) 05/03/2011   Chronic suprapubic catheter ([place]) 03/15/2011   Disease of female genital organs 10/24/2010   HYPERTENSION, BENIGN 01/31/2010   CP (cerebral palsy), spastic ([place]) 12/27/2008   Contracted, joint, multiple sites 12/27/2008   Major depressive disorder, recurrent episode ([place]) 01/29/2007   Obsessive Compulsive Disorder 01/29/2007   Transient alteration of awareness, recurrent 09/08/2006    Past Surgical History:  Procedure Laterality Date   ABDOMINAL HYSTERECTOMY     APPENDECTOMY     BACLOFEN PUMP REFILL     x 3 times   CARPAL TUNNEL RELEASE  08/2008   Dr Daylene Katayama   CESAREAN SECTION     x 2   CHOLECYSTECTOMY  10/14/2015   Procedure: LAPAROSCOPIC CHOLECYSTECTOMY;  Surgeon: Georganna Skeans, MD;  Location: [facility];  Service: General;;   COLPOSCOPY  06/2000   INTRAUTERINE DEVICE INSERTION  04/30/11   Inserted by [place] for endometriosis   LAPAROSCOPIC ASSISTED VAGINAL HYSTERECTOMY  10/27/2012   MULTIPLE EXTRACTIONS WITH ALVEOLOPLASTY Bilateral 07/19/2013   Procedure: EXTRACTIONS #4, 6,23,76,28;  Surgeon: Isac Caddy, DDS;   Location: [facility];  Service: Oral Surgery;  Laterality: Bilateral;   NEUROMA SURGERY Left    anterior and posterior intersosseous   PAIN PUMP IMPLANTATION N/A 03/07/2014   Procedure: baclofen pump revision/replacement and Catheter connection replacement;  Surgeon: Ophelia Charter, MD;  Location: [place] [facility];  Service: Neurosurgery;  Laterality: N/A;  baclofen pump revision/replacement and Catheter connection replacement   PROGRAMABLE BACLOFEN PUMP REVISION  03/17/14   Battery Replacement    TUBAL LIGATION  2003   URETHRA SURGERY     WRIST SURGERY  06/2010   Dr Nicoletta Dress, hand surgeon, [name]     OB History   No obstetric history on file.      Home Medications    Prior to Admission medications   Medication Sig Start Date End Date Taking? Authorizing Provider  acetaminophen (TYLENOL 8 HOUR) 650 MG CR tablet Take 1 tablet (650 mg total) by mouth every 8 (eight) hours as needed. Patient taking differently: Take 1,300 mg by mouth 2 (two) times daily as needed for pain.  07/08/18  Yes Varney Biles, MD  amantadine (SYMMETREL) 100 MG capsule Take 100 mg by mouth daily.    Yes [provider]  azelastine (ASTELIN) 0.1 %  nasal spray Place 2 sprays into both nostrils 2 (two) times daily. Use in each nostril as directed   Yes [provider]  baclofen (LIORESAL) 20 MG tablet Take 1 tablet (20 mg total) by mouth 4 (four) times daily. For bladder spasms 12/28/18  Yes Goodpasture, Otila Kluver, NP  BELBUCA 600 MCG FILM Place 600 mcg inside cheek 2 (two) times daily as needed. 02/25/19  Yes [provider]  busPIRone (BUSPAR) 5 MG tablet Take 5 mg by mouth 3 (three) times daily.   Yes [provider]  clonazePAM (KLONOPIN) 0.5 MG tablet Take 1.5 tablets (0.75 mg total) by mouth 3 (three) times daily. 11/27/17  Yes Mariel Aloe, MD  FLUoxetine (PROZAC) 40 MG capsule Take 80 mg by mouth daily.   Yes [provider]  guaiFENesin (MUCINEX) 600 MG 12 hr tablet Take 600  mg by mouth 2 (two) times daily as needed for cough or to loosen phlegm.    Yes [provider]  hydroxychloroquine (PLAQUENIL) 200 MG tablet Take 200 mg by mouth daily.   Yes [provider]  lamoTRIgine (LAMICTAL) 100 MG tablet Take 100 mg by mouth at bedtime.   Yes [provider]  LINZESS 290 MCG CAPS capsule Take 290 mcg by mouth every morning. 02/24/19  Yes [provider]  Melatonin 5 MG TABS Take 5 mg by mouth at bedtime.    Yes [provider]  ondansetron (ZOFRAN) 4 MG tablet Take 1 tablet (4 mg total) by mouth every 6 (six) hours as needed for nausea. 11/19/18  Yes Georgette Shell, MD  Oxycodone HCl 10 MG TABS Take 10 mg by mouth every 6 (six) hours as needed (pain).   Yes [provider]  polyethylene glycol powder (GLYCOLAX/MIRALAX) powder Take 1  1/2 dose  daily in 12 ounces of fluid every day. Patient taking differently: Take 25.5 g by mouth daily.  01/01/17  Yes Esterwood, [name] S, PA-C  QUEtiapine (SEROQUEL) 50 MG tablet Take 150 mg by mouth at bedtime.  01/08/19  Yes [provider]  rivaroxaban (XARELTO) 10 MG TABS tablet Take 10 mg by mouth every evening.    Yes [provider]  Skin Protectants, Misc. (EUCERIN) cream Apply topically as needed for wound care. Patient taking differently: Apply 1 application topically daily as needed for wound care.  02/05/19  Yes Albrizze, Kaitlyn E, PA-C  tiZANidine (ZANAFLEX) 4 MG capsule Take 1 capsule (4 mg total) by mouth 2 (two) times daily. 12/28/18  Yes Rockwell Germany, NP  topiramate (TOPAMAX) 100 MG tablet Take 1 tablet at bedtime Patient taking differently: Take 100 mg by mouth at bedtime.  02/10/19  Yes Rockwell Germany, NP  venlafaxine XR (EFFEXOR-XR) 37.5 MG 24 hr capsule Take 37.5 mg by mouth daily. 01/08/19  Yes [provider]  Buprenorphine HCl (BELBUCA) 450 MCG FILM Place 450 mcg inside cheek every 12 (twelve) hours. Patient not taking: Reported on  03/25/2019 08/22/18   Damita Lack, MD  feeding supplement, ENSURE ENLIVE, (ENSURE ENLIVE) LIQD Take 237 mLs by mouth 2 (two) times daily between meals. Patient not taking: Reported on 02/14/2019 05/30/18   Kayleen Memos, DO  MOVANTIK 12.5 MG TABS tablet Take 12.5 mg by mouth daily. 02/25/19   [provider]  NARCAN 4 MG/0.1ML LIQD nasal spray kit Place 4 mg into the nose See admin instructions. 02/24/19   [provider]  omeprazole (PRILOSEC) 20 MG capsule Take 1 capsule (20 mg total) by mouth  daily. Patient not taking: Reported on 02/14/2019 01/04/19   Horald Pollen, MD  oxybutynin (DITROPAN) 5 MG tablet Take 5 mg by mouth 4 (four) times daily.  02/20/16   [provider]    Family History Family History  Problem Relation Age of Onset   Asthma Father    Colon cancer Maternal Grandmother        Died in her 31's   Breast cancer Paternal Grandmother        Died in her 57's   Heart attack Maternal Grandfather        Died in his 80's   Alzheimer's disease Paternal Grandfather        Died in his 47's   Breast cancer Mother    Stomach cancer Neg Hx     Social History Social History   Tobacco Use   Smoking status: Never Smoker   Smokeless tobacco: Never Used  Substance Use Topics   Alcohol use: Yes    Alcohol/week: 1.0 standard drinks    Types: 1 Glasses of wine per week    Comment: Drinks alcohol once a month.   Drug use: No     Allergies   Morphine   Review of Systems Review of Systems  Constitutional:       Per HPI, otherwise negative  HENT:       Per HPI, otherwise negative  Respiratory:       Per HPI, otherwise negative  Cardiovascular:       Per HPI, otherwise negative  Gastrointestinal: Negative for vomiting.  Endocrine:       Negative aside from HPI  Genitourinary:       Neg aside from HPI   Musculoskeletal:       Per HPI, otherwise negative  Skin: Positive for wound.  Neurological: Positive for weakness.        Cerebral palsy at baseline spasticity     Physical Exam Updated Vital Signs BP 111/80 (BP Location: Left Arm)    Pulse 70    Temp 98.7 F (37.1 C) (Oral)    Resp 15    LMP 04/05/2011    SpO2 98%   Physical Exam Vitals signs and nursing note reviewed.  Constitutional:      Appearance: She is well-developed.     Comments: Ill-appearing female awake and alert  HENT:     Head: Normocephalic and atraumatic.  Eyes:     Conjunctiva/sclera: Conjunctivae normal.  Cardiovascular:     Rate and Rhythm: Normal rate and regular rhythm.  Pulmonary:     Effort: Pulmonary effort is normal. No respiratory distress.     Breath sounds: Normal breath sounds. No stridor.  Abdominal:     General: There is no distension.     Comments: In the left lower quadrant there is no gross drainage, bleeding, erythema around the exit site of the patient's suprapubic catheter minimal tenderness to palpation throughout the abdomen.  Skin:    General: Skin is warm and dry.     Comments: Stage II ulcer, without full dermal depth, but with excoriation of the skin, mild erythema surrounding.Posterior wound examined  Neurological:     Mental Status: She is alert and oriented to person, place, and time.     Cranial Nerves: No cranial nerve deficit.     Motor: Weakness, tremor, atrophy and abnormal muscle tone present.  Psychiatric:        Cognition and Memory: Cognition is not impaired.      ED  Treatments / Results  Labs (all labs ordered are listed, but only abnormal results are displayed) Labs Reviewed  COMPREHENSIVE METABOLIC PANEL - Abnormal; Notable for the following components:      Result Value   Chloride 113 (*)    Glucose, Bld 100 (*)    Calcium 8.5 (*)    AST 14 (*)    All other components within normal limits  URINALYSIS, ROUTINE W REFLEX MICROSCOPIC - Abnormal; Notable for the following components:   Color, Urine AMBER (*)    APPearance TURBID (*)    Bilirubin Urine SMALL (*)    Protein,  ur 100 (*)    Leukocytes,Ua LARGE (*)    Bacteria, UA MANY (*)    All other components within normal limits  URINE CULTURE  CBC WITH DIFFERENTIAL/PLATELET    EKG None  Radiology No results found.  Procedures Procedures (including critical care time)  Medications Ordered in ED Medications  cefTRIAXone (ROCEPHIN) 1 g in sodium chloride 0.9 % 100 mL IVPB (0 g Intravenous Stopped 03/25/19 2155)     Initial Impression / Assessment and Plan / ED Course  I have reviewed the triage vital signs and the nursing notes.  Pertinent labs & imaging results that were available during my care of the patient were reviewed by me and considered in my medical decision making (see chart for details).       EMR notable for 8 prior visits in 6 months, 3 abdominal CT scans within the past year.  Update:, Labs generally reassuring, although there is urinalysis evidence for urinary tract infection. No evidence for new kidney injury, no leukocytosis, and absent fever, low suspicion for bacteremia, sepsis. Abdomen is soft, nontender.  After reviewing patient's chart, including most recent urine culture results, patient has susceptibility to ceftriaxone, likely.  9:58 PM Is tolerated initial IV antibiotics without complication As above, with no evidence for bacteremia, sepsis, reassuring labs, patient will transition to oral antibiotics, as an outpatient. Patient aware of all findings, plan, follow-up instructions.  Final Clinical Impressions(s) / ED Diagnoses   Final diagnoses:  Urinary tract infection associated with cystostomy catheter, initial encounter [facility])    ED Discharge Orders         Ordered    cephALEXin (KEFLEX) 500 MG capsule  2 times daily     03/25/19 2201           Carmin Muskrat, MD 03/25/19 2202

## 2019-03-25 NOTE — ED Notes (Signed)
Bed: OT15 Expected date:  Expected time:  Means of arrival:  Comments: EMS blood in cath

## 2019-03-25 NOTE — ED Triage Notes (Signed)
Pt arrived via EMS from home. Pt is c/o blood in her suprapubic cath and states that it is leaking.  Pt has a pressure ulcer to buttocks. Pt denies pain at this time.   V/s 134/p Hr 92, RR 18, 98% RA.

## 2019-03-25 NOTE — ED Notes (Signed)
Iv team in room 

## 2019-03-25 NOTE — ED Notes (Signed)
Pt unhappy with provider not changing out her supra cath.  Pt demanding water and to be changed  Pt changed and cleaned up, wound on left side and right rear cleaned and covered and  New depends  Applied  [facility] called  Paper work complete

## 2019-03-28 LAB — URINE CULTURE: Culture: >=100000 — AB

## 2019-03-29 ENCOUNTER — Telehealth: Payer: Self-pay | Admitting: Emergency Medicine

## 2019-03-29 NOTE — Telephone Encounter (Signed)
Post ED Visit - Positive Culture Follow-up  Culture report reviewed by antimicrobial stewardship pharmacist: [facility] []  Enzo Bi, Pharm.D. []  Celedonio Miyamoto, Pharm.D., BCPS AQ-ID []  Garvin Fila, Pharm.D., BCPS []  Georgina Pillion, Pharm.D., BCPS []  [place], [place].D., BCPS, AAHIVP []  Estella Husk, Pharm.D., BCPS, AAHIVP [x]  Lysle Pearl, PharmD, BCPS []  Phillips Climes, PharmD, BCPS []  Agapito Games, PharmD, BCPS []  Verlan Friends, PharmD []  Mervyn Gay, PharmD, BCPS []  Vinnie Level, PharmD  [facility] []  Len Childs, PharmD []  Greer Pickerel, PharmD []  Adalberto Cole, PharmD []  Perlie Gold, Rph []  Lonell Face) Jean Rosenthal, PharmD []  Earl Many, PharmD []  Junita Push, PharmD []  Dorna Leitz, PharmD []  Terrilee Files, PharmD []  Lynann Beaver, PharmD []  Keturah Barre, PharmD []  Loralee Pacas, PharmD []  Bernadene Person, PharmD   Positive urine culture Treated with cephalexin, organism sensitive to the same and no further patient follow-up is required at this time.  Berle Mull 03/29/2019, 12:53 PM

## 2019-03-30 ENCOUNTER — Emergency Department (HOSPITAL_COMMUNITY)
Admission: EM | Admit: 2019-03-30 | Discharge: 2019-03-30 | Disposition: A | Discharge status: Home / Self Care | Payer: Medicare Other | Attending: Emergency Medicine | Admitting: Emergency Medicine

## 2019-03-30 DIAGNOSIS — G809 Cerebral palsy, unspecified: Secondary | ICD-10-CM | POA: Diagnosis not present

## 2019-03-30 DIAGNOSIS — I10 Essential (primary) hypertension: Secondary | ICD-10-CM | POA: Insufficient documentation

## 2019-03-30 DIAGNOSIS — Z79899 Other long term (current) drug therapy: Secondary | ICD-10-CM | POA: Diagnosis not present

## 2019-03-30 DIAGNOSIS — Z9049 Acquired absence of other specified parts of digestive tract: Secondary | ICD-10-CM | POA: Diagnosis not present

## 2019-03-30 DIAGNOSIS — T83038A Leakage of other indwelling urethral catheter, initial encounter: Secondary | ICD-10-CM | POA: Insufficient documentation

## 2019-03-30 DIAGNOSIS — Z7901 Long term (current) use of anticoagulants: Secondary | ICD-10-CM | POA: Insufficient documentation

## 2019-03-30 DIAGNOSIS — F419 Anxiety disorder, unspecified: Secondary | ICD-10-CM | POA: Insufficient documentation

## 2019-03-30 DIAGNOSIS — Y828 Other medical devices associated with adverse incidents: Secondary | ICD-10-CM | POA: Diagnosis not present

## 2019-03-30 DIAGNOSIS — F329 Major depressive disorder, single episode, unspecified: Secondary | ICD-10-CM | POA: Insufficient documentation

## 2019-03-30 MED ORDER — HYDROMORPHONE HCL 1 MG/ML IJ SOLN
0.5000 mg | Freq: Once | INTRAMUSCULAR | Status: AC
  Administered 2019-03-30: 12:00:00 0.5 mg via INTRAMUSCULAR
  Filled 2019-03-30: qty 1

## 2019-03-30 NOTE — ED Triage Notes (Signed)
Transported by [facility] from home-- persistent problem for the last month-- urinary retention and abdominal pain. Patient's caregiver told [facility] that suprapubic catheter has not been draining like normal. No s/s of infection at catheter site per [facility].

## 2019-03-30 NOTE — ED Notes (Signed)
[facility] has been contacted regarding patient transport.  

## 2019-03-30 NOTE — ED Notes (Signed)
Bed: WA17 Expected date:  Expected time:  Means of arrival:  Comments: EMS-urinary retention 

## 2019-03-30 NOTE — ED Provider Notes (Signed)
[facility] DEPT Provider Note   CSN: [id number] Arrival date & time: 03/30/19  2751    History   Chief Complaint Chief Complaint  Patient presents with  . Abdominal Pain    HPI [name] [name] is a 43 y.o. female.     HPI   70yF with leaking suprapubic catheter. Hx of neurogenic bladder. Followed by urology at [facility]. Appears catheter last changed 03/11/19 in the office. Note at that time mentioned leaking at insertion and consistently wet. Telephone correspondence on 03/26/19 from Dr Amalia Hailey noting that she leaks because she is due for her botox injection. Pt reports her abdomen hurts. She attributes this to her urinary catheter. She is currently on keflex which was prescribed during ED visit 03/25/19. Urine culture from that time grew out both proteus and klebsiella. Both were sensitive to cephalosporins.   Past Medical History:  Diagnosis Date  . Acute respiratory failure with hypoxia ([place])   . Anemia   . Cellulitis 12/22/2014  . Cerebral palsy ([name])    Spastic Cerebral palsy, mentally intact  . Contracture, joint, multiple sites    Electric wheelchair, uses left hand to operate chair.   . Depression   . [name]   . Dysarthria   . GERD (gastroesophageal reflux disease)   . Gram positive sepsis ([place]) 10/24/2014  . HCAP (healthcare-associated pneumonia)    2015, 2018  . History of endometriosis 10/2012   OBGYN [facility]: Laparoscopic Endometrial Ablation, TVH,  . History of recurrent UTIs   . Hypertension   . Incontinent of feces 03/11/2016  . Intracervical pessary 05/07/2011   Overview:  Overview:  Placed by [facility] 04/30/11 to treat DUB and Endometriosis.  She is seen at GYN clinic at [facility] but was considered a poor surgical candidate and referred her to [facility].  For DUB she had pelvic ultrasound that showed thin stripe and she had endometrial Bx by Dr Nori Riis in [place] clinic, which was negative.     . Macroscopic hematuria  03/10/2013   status post replacement of suprapubic tube 03/04/2013   . Migraine   . OCD (obsessive compulsive disorder)   . Parapneumonic effusion 10/25/2014  . Presence of intrathecal baclofen pump 03/07/2014   R LQ. Insertion T10.    [name] Kitchen Psychiatric illness 10/31/2014  . Recurrent pulmonary embolism ([name])    Pt has history of recurrent PE and is on lifelong coumadin.   . Seizures ([place]) 02/12/2016  . Spasticity 01/05/2014  . Transient alteration of awareness, recurrent 09/08/2006   Recurrent episodes where [place]  loses contact with her world and that have been studied thoroughly and appear nonepileptic in nature per Dr Krista Blue (neurology) The patient has had episodes starting in 2007 that initially began 1-4 times a day during which time she would have periods of unawareness followed by confusion. This continuous video EEG monitoring performed from October 8-12, 2007. Had   . Urethra dilated and patulous 07/03/2015   Patulous, Dilated Urethra. 33m balloon on a 22-Fr catheter hoping this would prevent the bladder spasm from pushing the balloon down the urethra (Dr EAmalia Hailey [facility], July 2016)     Patient Active Problem List   Diagnosis Date Noted  . Palliative care encounter   . Cough 01/04/2019  . Bad taste in mouth 01/04/2019  . Hypotension 11/19/2018  . Complicated UTI (urinary tract infection) 11/18/2018  . Solitary pulmonary nodule on lung CT 10/28/2018  . Connective tissue disorder ([place] 08/07/2018  . Migraine  without aura and without status migrainosus, not intractable 06/26/2018  . UTI (urinary tract infection) 05/25/2018  . Headache 05/25/2018  . Incontinent of feces 03/11/2016  . Chronic anticoagulation 03/11/2016  . Involuntary movements 02/22/2016  . Seizures ([place]) 02/12/2016  . Dyspnea 02/12/2016  . Cerebral palsy, quadriplegic ([place]) 02/06/2016  . Swelling of extremity 01/13/2016  . Nausea without vomiting 11/09/2015  . Fever   . Pressure ulcer 10/09/2015  . Status post  insertion of inferior vena caval filter 09/28/2015  . Absence of bladder continence 08/25/2015  . Person living in residential institution 06/14/2015  . Pulmonary hypertension ([place])   . Anemia of chronic disease   . Spasticity 03/15/2015  . Constipation, chronic 10/10/2014  . 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 Dystonia 04/12/2014  . UTI (lower urinary tract infection) 03/16/2014  . Presence of intrathecal baclofen pump 03/07/2014  . Dental caries 07/18/2013  . Multiple thyroid nodules 05/20/2013  . Osteoarthrosis 04/07/2013  . [name]   . Dysarthria   . GERD (gastroesophageal reflux disease)   . Neurogenic bladder 02/05/2013  . Anxiety state 10/21/2012  . Chronic pain syndrome 06/17/2012  . Recurrent pulmonary embolism ([place]) 05/03/2011  . Pulmonary embolism with infarction ([place]) 05/03/2011  . Chronic suprapubic catheter ([place]) 03/15/2011  . Disease of female genital organs 10/24/2010  . HYPERTENSION, BENIGN 01/31/2010  . CP (cerebral palsy), spastic ([place]) 12/27/2008  . Contracted, joint, multiple sites 12/27/2008  . Major depressive disorder, recurrent episode ([place]) 01/29/2007  . Obsessive Compulsive Disorder 01/29/2007  . Transient alteration of awareness, recurrent 09/08/2006    Past Surgical History:  Procedure Laterality Date  . ABDOMINAL HYSTERECTOMY    . APPENDECTOMY    . BACLOFEN PUMP REFILL     x 3 times  . CARPAL TUNNEL RELEASE  08/2008   Dr Daylene Katayama  . CESAREAN SECTION     x 2  . CHOLECYSTECTOMY  10/14/2015   Procedure: LAPAROSCOPIC CHOLECYSTECTOMY;  Surgeon: Georganna Skeans, MD;  Location: [facility];  Service: General;;  . COLPOSCOPY  06/2000  . INTRAUTERINE DEVICE INSERTION  04/30/11   Inserted by [place] for endometriosis  . LAPAROSCOPIC ASSISTED VAGINAL HYSTERECTOMY  10/27/2012  . MULTIPLE EXTRACTIONS WITH ALVEOLOPLASTY Bilateral 07/19/2013   Procedure: EXTRACTIONS #4, 1,95,09,32;  Surgeon: Isac Caddy, DDS;   Location: [facility];  Service: Oral Surgery;  Laterality: Bilateral;  . NEUROMA SURGERY Left    anterior and posterior intersosseous  . PAIN PUMP IMPLANTATION N/A 03/07/2014   Procedure: baclofen pump revision/replacement and Catheter connection replacement;  Surgeon: Ophelia Charter, MD;  Location: [facility];  Service: Neurosurgery;  Laterality: N/A;  baclofen pump revision/replacement and Catheter connection replacement  . PROGRAMABLE BACLOFEN PUMP REVISION  03/17/14   Battery Replacement   . TUBAL LIGATION  2003  . URETHRA SURGERY    . WRIST SURGERY  06/2010   Dr Nicoletta Dress, hand surgeon, [name]     OB History   No obstetric history on file.      Home Medications    Prior to Admission medications   Medication Sig Start Date End Date Taking? Authorizing Provider  acetaminophen (TYLENOL 8 HOUR) 650 MG CR tablet Take 1 tablet (650 mg total) by mouth every 8 (eight) hours as needed. Patient taking differently: Take 1,300 mg by mouth 2 (two) times daily as needed for pain.  07/08/18   Varney Biles, MD  amantadine (SYMMETREL) 100 MG capsule Take 100 mg by mouth daily.  [provider]  azelastine (ASTELIN) 0.1 % nasal spray Place 2 sprays into both nostrils 2 (two) times daily. Use in each nostril as directed    [provider]  baclofen (LIORESAL) 20 MG tablet Take 1 tablet (20 mg total) by mouth 4 (four) times daily. For bladder spasms 12/28/18   Rockwell Germany, NP  BELBUCA 600 MCG FILM Place 600 mcg inside cheek 2 (two) times daily as needed. 02/25/19   [provider]  Buprenorphine HCl (BELBUCA) 450 MCG FILM Place 450 mcg inside cheek every 12 (twelve) hours. Patient not taking: Reported on 03/25/2019 08/22/18   Damita Lack, MD  busPIRone (BUSPAR) 5 MG tablet Take 5 mg by mouth 3 (three) times daily.    [provider]  cephALEXin (KEFLEX) 500 MG capsule Take 1 capsule (500 mg total) by mouth 2 (two) times daily for 7 days. 03/25/19 04/01/19   Carmin Muskrat, MD  clonazePAM (KLONOPIN) 0.5 MG tablet Take 1.5 tablets (0.75 mg total) by mouth 3 (three) times daily. 11/27/17   Mariel Aloe, MD  feeding supplement, ENSURE ENLIVE, (ENSURE ENLIVE) LIQD Take 237 mLs by mouth 2 (two) times daily between meals. Patient not taking: Reported on 02/14/2019 05/30/18   Kayleen Memos, DO  FLUoxetine (PROZAC) 40 MG capsule Take 80 mg by mouth daily.    [provider]  guaiFENesin (MUCINEX) 600 MG 12 hr tablet Take 600 mg by mouth 2 (two) times daily as needed for cough or to loosen phlegm.     [provider]  hydroxychloroquine (PLAQUENIL) 200 MG tablet Take 200 mg by mouth daily.    [provider]  lamoTRIgine (LAMICTAL) 100 MG tablet Take 100 mg by mouth at bedtime.    [provider]  LINZESS 290 MCG CAPS capsule Take 290 mcg by mouth every morning. 02/24/19   [provider]  Melatonin 5 MG TABS Take 5 mg by mouth at bedtime.     [provider]  MOVANTIK 12.5 MG TABS tablet Take 12.5 mg by mouth daily. 02/25/19   [provider]  NARCAN 4 MG/0.1ML LIQD nasal spray kit Place 4 mg into the nose See admin instructions. 02/24/19   [provider]  omeprazole (PRILOSEC) 20 MG capsule Take 1 capsule (20 mg total) by mouth daily. Patient not taking: Reported on 02/14/2019 01/04/19   Horald Pollen, MD  ondansetron (ZOFRAN) 4 MG tablet Take 1 tablet (4 mg total) by mouth every 6 (six) hours as needed for nausea. 11/19/18   Georgette Shell, MD  oxybutynin (DITROPAN) 5 MG tablet Take 5 mg by mouth 4 (four) times daily.  02/20/16   [provider]  Oxycodone HCl 10 MG TABS Take 10 mg by mouth every 6 (six) hours as needed (pain).    [provider]  polyethylene glycol powder (GLYCOLAX/MIRALAX) powder Take 1  1/2 dose  daily in 12 ounces of fluid every day. Patient taking differently: Take 25.5 g by mouth daily.  01/01/17   Esterwood, [name] S, PA-C  QUEtiapine  (SEROQUEL) 50 MG tablet Take 150 mg by mouth at bedtime.  01/08/19   [provider]  rivaroxaban (XARELTO) 10 MG TABS tablet Take 10 mg by mouth every evening.     [provider]  Skin Protectants, Misc. (EUCERIN) cream Apply topically as needed for wound care. Patient taking differently: Apply 1 application topically daily as needed for wound care.  02/05/19   Albrizze, Kaitlyn E, PA-C  tiZANidine (  ZANAFLEX) 4 MG capsule Take 1 capsule (4 mg total) by mouth 2 (two) times daily. 12/28/18   Rockwell Germany, NP  topiramate (TOPAMAX) 100 MG tablet Take 1 tablet at bedtime Patient taking differently: Take 100 mg by mouth at bedtime.  02/10/19   Rockwell Germany, NP  venlafaxine XR (EFFEXOR-XR) 37.5 MG 24 hr capsule Take 37.5 mg by mouth daily. 01/08/19   [provider]    Family History Family History  Problem Relation Age of Onset  . Asthma Father   . Colon cancer Maternal Grandmother        Died in her 66's  . Breast cancer Paternal Grandmother        Died in her 43's  . Heart attack Maternal Grandfather        Died in his 88's  . Alzheimer's disease Paternal Grandfather        Died in his 34's  . Breast cancer Mother   . Stomach cancer Neg Hx     Social History Social History   Tobacco Use  . Smoking status: Never Smoker  . Smokeless tobacco: Never Used  Substance Use Topics  . Alcohol use: Yes    Alcohol/week: 1.0 standard drinks    Types: 1 Glasses of wine per week    Comment: Drinks alcohol once a month.  . Drug use: No     Allergies   Morphine   Review of Systems Review of Systems All systems reviewed and negative, other than as noted in HPI.   Physical Exam Updated Vital Signs BP (!) 136/110   Pulse 88   Temp 97.7 F (36.5 C)   Resp 18   LMP 04/05/2011   SpO2 99%   Physical Exam Vitals signs and nursing note reviewed.  Constitutional:      Comments: Chronically ill appearing but not distressed.   HENT:     Head: Atraumatic.   Eyes:     General:        Right eye: No discharge.        Left eye: No discharge.     Conjunctiva/sclera: Conjunctivae normal.  Neck:     Musculoskeletal: Neck supple.  Cardiovascular:     Rate and Rhythm: Normal rate and regular rhythm.     Heart sounds: Normal heart sounds. No murmur. No friction rub. No gallop.   Pulmonary:     Effort: Pulmonary effort is normal. No respiratory distress.     Breath sounds: Normal breath sounds.  Abdominal:     General: There is no distension.     Palpations: Abdomen is soft.     Comments: Suprapubic catheter to leg bag. No concerning surrounding skin changes. Area dry and absorptive pad around it is dry as well. ~50cc of darkish urine noted in bag.  Mild diffuse abdominal tenderness. No distension. No rebound/guarding.   Musculoskeletal:        General: No tenderness.  Skin:    General: Skin is warm and dry.  Neurological:     Mental Status: She is alert.     Comments: Increased muscle tone  Psychiatric:        Thought Content: Thought content normal.      ED Treatments / Results  Labs (all labs ordered are listed, but only abnormal results are displayed) Labs Reviewed  URINE CULTURE    EKG None  Radiology No results found.  Procedures Procedures (including critical care time)  Medications Ordered in ED Medications - No data to display  Initial Impression / Assessment and Plan / ED Course  I have reviewed the triage vital signs and the nursing notes.  Pertinent labs & imaging results that were available during my care of the patient were reviewed by me and considered in my medical decision making (see chart for details).  43yF with leaking suprapubic catheter. Urine noted in bag. [facility] reportedly emptied it prior to transport. She has a pad tucked into her pull-up which is dry although I'm not sure when it was last placed.   She is not due for routine catheter change at this point. Will flush her catheter to making sure  it is functioning appropriately. Will replace if it is not.  Hx of ESBL infections, but last culture with proteus/klebsiella sensitive to cephalopsorins and she is on keflex. She is afebrile. BP fine. Some mild tenderness on exam. No peritonitis. From reviewing records, it seems like this may be chronic to some degree. Urology note mentioning that leakage may possibly be helped with botox injections which she is due for. Not something done in ED and can appropriately follow-up as outpt. Discussed plan with pt and caretaker "Nene" via phone.   Final Clinical Impressions(s) / ED Diagnoses   Final diagnoses:  Leakage from urinary catheter, initial encounter [facility])    ED Discharge Orders    None       Virgel Manifold, MD 03/31/19 [phone]

## 2019-03-30 NOTE — Discharge Instructions (Signed)
Your bladder leakage may because you are due for botox injection. Your catheter appears to be functioning appropriately. You had an infection diagnosed during your last ED visit. You were placed on an appropriate antibiotic. You need to follow-up with your urologist for further recommendations.

## 2019-03-31 LAB — URINE CULTURE

## 2019-04-08 ENCOUNTER — Encounter (INDEPENDENT_AMBULATORY_CARE_PROVIDER_SITE_OTHER): Payer: Medicare Other | Admitting: Pediatrics

## 2019-04-09 ENCOUNTER — Other Ambulatory Visit: Payer: Self-pay

## 2019-04-09 ENCOUNTER — Ambulatory Visit (INDEPENDENT_AMBULATORY_CARE_PROVIDER_SITE_OTHER): Payer: Medicare Other | Admitting: Pediatrics

## 2019-04-09 ENCOUNTER — Encounter (INDEPENDENT_AMBULATORY_CARE_PROVIDER_SITE_OTHER): Payer: Self-pay | Admitting: Pediatrics

## 2019-04-09 VITALS — BP 104/66 | HR 60

## 2019-04-09 DIAGNOSIS — G249 Dystonia, unspecified: Secondary | ICD-10-CM

## 2019-04-09 DIAGNOSIS — G808 Other cerebral palsy: Secondary | ICD-10-CM | POA: Diagnosis not present

## 2019-04-09 NOTE — Progress Notes (Signed)
Patient: [name] [name] MRN: [id number] Sex: female DOB: Jan 24, 1976  Provider: Wyline Copas, MD Location of Care: [facility]  Note type: Routine return visit  History of Present Illness: Referral Source: Melanie March, MD History from: patient and [facility] chart Chief Complaint: Baclofen Pump Refill  [name] [name] is a 43 y.o. female who presents with a history ofextreme prematurity resulting in spastic/athetoid quadriparesiswith a baclofen intrathecal pump. She was last seen in neurology clinic by Rockwell Germany, NP on February 10, 2019. At this time [name] [name] felt that her topiramate was not working as well for migraine prevention because of depression since her mother's death.  She also had muscle spasms all over her body.  She is been seeing a therapist every 2 weeks to deal with her grief reaction.  But she reported feeling unusually fatigued with naps during the day,  Today, she tells me that she is tired, looks pale.  She will be moved into an independent apartment next week with assistance from her CNA. Her new apartment will be wheelchair accessible.  Not getting along with her roommate and is looking for another living arrangement.  I saw her today with Otila Kluver who evaluated her and found no signs of infection and normotension.  The last time that we saw her in December we had to admit her to the hospital with urosepsis.  Procedure: Reprogramming intrathecal baclofen pump  The device was interrogated and showed a complex continuous infusion of baclofen that has a basal rate of23.7mg per hour. The patient receives bolus doses of 27 mcg delivered at midnight, 6 AM, 12 noon, and 6 PM, for total of525.780m per day.  She was sterilely prepped and draped. A 1-1/2 inch 22-gauge noncoring Huebner needle was inserted on thefirstpass. The apex of the reservoir has shifted and is pointing inferiorly. 3.33m64mas withdrawn from the pump placing it under partial vacuum.  40 mL of baclofen (concentration 2000 mcg/mL) was instilled into the pump through a Millipore filter. She tolerated the procedure well.  Her reservoir alarm date isSeptember 26 , 2020. ERI is [id number] of Systems: A complete review of systems was assessed and is negative except as noted above.  Past Medical History Diagnosis Date  . Acute respiratory failure with hypoxia ([name])[place]. Anemia   . Cellulitis 12/22/2014  . Cerebral palsy ([name])    Spastic Cerebral palsy, mentally intact  . Contracture, joint, multiple sites    Electric wheelchair, uses left hand to operate chair.   . Depression   . [name]   . Dysarthria   . GERD (gastroesophageal reflux disease)   . Gram positive sepsis ([name])[place]/23/2015  . HCAP (healthcare-associated pneumonia)    2015, 2018  . History of endometriosis 10/2012   OBGYN [facility]: Laparoscopic Endometrial Ablation, TVH,  . History of recurrent UTIs   . Hypertension   . Incontinent of feces 03/11/2016  . Intracervical pessary 05/07/2011   Overview:  Overview:  [facility] to treat DUB and Endometriosis.  [facility] considered [facility] that showed thin stripe and she had endometrial Bx by Dr NealNori RiisGYN [place], which was negative.     . Macroscopic hematuria 03/10/2013   status post replacement of suprapubic tube 03/04/2013   . Migraine   . OCD (obsessive compulsive disorder)   .  Parapneumonic effusion 10/25/2014  . Presence of intrathecal baclofen pump 03/07/2014   R LQ. Insertion T10.    [name] Kitchen Psychiatric illness 10/31/2014  . Recurrent pulmonary embolism ([name])    Pt has history of recurrent PE and is on lifelong coumadin.   . Seizures ([place]) 02/12/2016  . Spasticity 01/05/2014  . Transient alteration of awareness, recurrent 09/08/2006   Recurrent episodes where [name]  loses contact with her world  and that have been studied thoroughly and appear nonepileptic in nature per Dr Krista Blue (neurology) The patient has had episodes starting in 2007 that initially began 1-4 times a day during which time she would have periods of unawareness followed by confusion. This continuous video EEG monitoring performed from October 8-12, 2007. Had   . Urethra dilated and patulous 07/03/2015   Patulous, Dilated Urethra. 74m balloon on a 22-Fr catheter hoping this would prevent the bladder spasm from pushing the balloon down the urethra (Dr EAmalia Hailey [facility], July 2016)    Hospitalizations: Yes., Head Injury: No., Nervous System Infections: No., Immunizations up to date: Yes.    Birth History Extreme prematurity  Behavior History Depression, anxiety, and "blackouts "that are nonepileptic events  Surgical History Procedure Laterality Date  . ABDOMINAL HYSTERECTOMY    . APPENDECTOMY    . BACLOFEN PUMP REFILL     x 3 times  . CARPAL TUNNEL RELEASE  08/2008   Dr SDaylene Katayama . CESAREAN SECTION     x 2  . CHOLECYSTECTOMY  10/14/2015   Procedure: LAPAROSCOPIC CHOLECYSTECTOMY;  Surgeon: BGeorganna Skeans MD;  [facility]  Service: General;;  . COLPOSCOPY  06/2000  . INTRAUTERINE DEVICE INSERTION  04/30/11   [facility]  . LAPAROSCOPIC ASSISTED VAGINAL HYSTERECTOMY  10/27/2012  . MULTIPLE EXTRACTIONS WITH ALVEOLOPLASTY Bilateral 07/19/2013   Procedure: EXTRACTIONS #4, 52,94,76,54  Surgeon: CIsac Caddy DDS;  [facility]  Service: Oral Surgery;  Laterality: Bilateral;  . NEUROMA SURGERY Left    anterior and posterior intersosseous  . PAIN PUMP IMPLANTATION N/A 03/07/2014   Procedure: baclofen pump revision/replacement and Catheter connection replacement;  Surgeon: JOphelia Charter MD;  [facility];  Service: Neurosurgery;  Laterality: N/A;  baclofen pump revision/replacement and Catheter connection replacement  . PROGRAMABLE BACLOFEN PUMP REVISION   03/17/14   Battery Replacement   . TUBAL LIGATION  2003  . URETHRA SURGERY    . WRIST SURGERY  06/2010   Dr LNicoletta Dress hand surgeon, [facility]  Family History family history includes Alzheimer's disease in her paternal grandfather; Asthma in her father; Breast cancer in her mother and paternal grandmother; Colon cancer in her maternal grandmother; Heart attack in her maternal grandfather. Family history is negative for migraines, seizures, intellectual disabilities, blindness, deafness, birth defects, chromosomal disorder, or autism.  Social History  Socioeconomic History  . Marital status: Divorced  . Number of children: 2  . Years of education: college  . Highest education level:  Associates degree  Occupational History    Employer:  Unemployed due to disability  Social Needs  . Financial resource strain: Not on file  . Food insecurity:    Worry: Not on file    Inability: Not on file  . Transportation needs:    Medical: Not on file    Non-medical: Not on file  Tobacco Use  . Smoking status: Never Smoker  . Smokeless tobacco: Never Used  Substance and Sexual Activity  . Alcohol use: Yes    Alcohol/week:  1.0 standard drinks    Types: 1 Glasses of wine per week    Comment: Drinks alcohol once a month.  . Drug use: No  . 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     She enjoys shopping.    Lives in a [place] since 02/11/16       Delrae Sawyers, who has schizophrenia, MR, is father of daughter.  Pilar Plate and pt are no longer together.  Pilar Plate does not see Cleotis Lema. Daughter is 16 yo, Nat Math, lives in a group home in [place].     [name] [name]'s mother reportedly has breast cancer.     Needs assistance with ADL's and IADL's--has personal care services 20 hrs/wk;        No tobacco, EtOH, drugs.       **Case Manager: Maia Petties [phone]        Patient lives in an independent apartment   Allergies Allergen Reactions  . Morphine Dermatitis and Other (See Comments)    Skin turned red    Physical Exam BP 104/66 Comment: left arm  Pulse 60   LMP 04/05/2011   Not examined  Assessment 1.  Congenital quadriplegia, G80.8. 2.  Dystonia, G24.9.  Discussion [name] [name] is stable but looks quite thin and tired.  She is not getting up daily and very quickly will become deconditioned.  She is not been able to get therapy because of social distancing and her living arrangement.  In the past, noted when she becomes deconditioned she loses her ability to independently care for herself.  When she has ongoing daily therapy, she regains that ability.  Plan It is my hope that she will be able to get some therapy, and moved to a living situation where she is not unhappy.  I think depression is a major part of her problem at this point.  At least she is not sick as she was when I saw her last December.  She will return on September 24 so that we have an extra day in case there is a problem with transportation.   Medication List   Accurate as of Apr 09, 2019 12:14 PM. If you have any questions, ask your nurse or doctor.    acetaminophen 650 MG CR tablet Commonly known as:  Tylenol 8 Hour Take 1 tablet (650 mg total) by mouth every 8 (eight) hours as needed. What changed:    how much to take  when to take this  reasons to take this   amantadine 100 MG capsule Commonly known as:  SYMMETREL Take 100 mg by mouth daily.   azelastine 0.1 % nasal spray Commonly known as:  ASTELIN Place 2 sprays into both nostrils 2 (two) times daily. Use in each nostril as directed   baclofen 20 MG tablet Commonly known as:  LIORESAL Take 1 tablet (20 mg total) by mouth 4 (four) times daily. For bladder spasms   Buprenorphine HCl 450 MCG Film Commonly known as:  Belbuca Place 450 mcg inside cheek every 12 (twelve) hours.   Belbuca 600 MCG Film  Generic drug:  Buprenorphine HCl Place 600 mcg inside cheek 2 (two) times daily as needed.   Belbuca 750 MCG Film Generic drug:  Buprenorphine HCl   busPIRone 5 MG tablet Commonly known as:  BUSPAR Take 5 mg by mouth 3 (three) times daily.   clonazePAM 0.5 MG tablet Commonly known as:  KLONOPIN Take 1.5 tablets (0.75 mg total) by mouth 3 (three) times daily.   eucerin cream Apply topically as needed for wound care. What changed:    how much to take  when to take this   feeding supplement (ENSURE ENLIVE) Liqd Take 237 mLs by mouth 2 (two) times daily between meals.   FLUoxetine 40 MG capsule Commonly known as:  PROZAC Take 80 mg by mouth daily.   guaiFENesin 600 MG 12 hr tablet Commonly known as:  MUCINEX Take 600 mg by mouth 2 (two) times daily as needed for cough or to loosen phlegm.   hydroxychloroquine 200 MG tablet Commonly known as:  PLAQUENIL Take 200 mg by mouth daily.   lamoTRIgine 100 MG tablet Commonly known as:  LAMICTAL Take 100 mg by mouth at bedtime.   Linzess 290 MCG Caps capsule Generic drug:  linaclotide Take 290 mcg by mouth every morning.   Melatonin 5 MG Tabs Take 5 mg by mouth at bedtime.   Movantik 12.5 MG Tabs tablet Generic drug:  naloxegol oxalate Take 12.5 mg by mouth daily.   Narcan 4 MG/0.1ML Liqd nasal spray kit Generic drug:  naloxone Place 4 mg into the nose See admin instructions.   omeprazole 20 MG capsule Commonly known as:  PRILOSEC Take 1 capsule (20 mg total) by mouth daily.   ondansetron 4 MG tablet Commonly known as:  ZOFRAN Take 1 tablet (4 mg total) by mouth every 6 (six) hours as needed for nausea.   oxybutynin 5 MG tablet Commonly known as:  DITROPAN Take 5 mg by mouth 4 (four) times daily.   Oxycodone HCl 10 MG Tabs Take 10 mg by mouth every 6 (six) hours as needed (pain).   polyethylene glycol powder 17 GM/SCOOP powder Commonly known as:  GLYCOLAX/MIRALAX Take 1  1/2 dose  daily in 12 ounces of  fluid every day. What changed:    how much to take  how to take this  when to take this  additional instructions   QUEtiapine 50 MG tablet Commonly known as:  SEROQUEL Take 150 mg by mouth at bedtime.   rivaroxaban 10 MG Tabs tablet Commonly known as:  XARELTO Take 10 mg by mouth every evening.   tiZANidine 4 MG capsule Commonly known as:  ZANAFLEX Take 1 capsule (4 mg total) by mouth 2 (two) times daily.   topiramate 100 MG tablet Commonly known as:  TOPAMAX Take 1 tablet at bedtime What changed:    how much to take  how to take this  when to take this  additional instructions   venlafaxine XR 37.5 MG 24 hr capsule Commonly known as:  EFFEXOR-XR Take 37.5 mg by mouth daily.    The medication list was reviewed and reconciled. All changes or newly prescribed medications were explained.  A complete medication list was provided to the patient/caregiver.  Jodi Geralds MD

## 2019-04-09 NOTE — Patient Instructions (Addendum)
The empty refill and reprogram date is August 28, 2019.  That is a Saturday.  I want you to come in on the 25th or sometime that week.  Please let me know if there is anything else that we can do.

## 2019-04-14 ENCOUNTER — Telehealth (INDEPENDENT_AMBULATORY_CARE_PROVIDER_SITE_OTHER): Payer: Self-pay | Admitting: Family

## 2019-04-14 NOTE — Telephone Encounter (Signed)
I called again and was able to reach [name] [name]. She said that she was having more blackout spells, as she has had in the past. She said that they were scary to her and to others around her. [name] said that her boss wanted a note to say that the spells were not seizures and that she could continue to work. I told her that I would write the letter and mail it to her. She agreed with this plan. TG

## 2019-04-14 NOTE — Telephone Encounter (Signed)
I received an after hours call from [employer] saying that [name] [name] had contacted them to ask for call back from a provider regarding blackouts. I attempted to call [name] [name] and did not receive an answer x3. The voicemail was full each time and I was unable to leave a message. I will continue to try to call her. TG

## 2019-04-19 ENCOUNTER — Emergency Department (HOSPITAL_COMMUNITY)
Admission: EM | Admit: 2019-04-19 | Discharge: 2019-04-19 | Disposition: A | Discharge status: Home / Self Care | Payer: Medicare Other | Attending: Emergency Medicine | Admitting: Emergency Medicine

## 2019-04-19 ENCOUNTER — Telehealth: Payer: Self-pay | Admitting: Family Medicine

## 2019-04-19 ENCOUNTER — Other Ambulatory Visit: Payer: Self-pay

## 2019-04-19 ENCOUNTER — Encounter (HOSPITAL_COMMUNITY): Payer: Self-pay

## 2019-04-19 DIAGNOSIS — L89152 Pressure ulcer of sacral region, stage 2: Secondary | ICD-10-CM | POA: Diagnosis not present

## 2019-04-19 DIAGNOSIS — R569 Unspecified convulsions: Secondary | ICD-10-CM | POA: Insufficient documentation

## 2019-04-19 DIAGNOSIS — N3000 Acute cystitis without hematuria: Secondary | ICD-10-CM | POA: Diagnosis not present

## 2019-04-19 DIAGNOSIS — I10 Essential (primary) hypertension: Secondary | ICD-10-CM | POA: Insufficient documentation

## 2019-04-19 DIAGNOSIS — M545 Low back pain: Secondary | ICD-10-CM | POA: Diagnosis present

## 2019-04-19 DIAGNOSIS — G809 Cerebral palsy, unspecified: Secondary | ICD-10-CM | POA: Diagnosis not present

## 2019-04-19 DIAGNOSIS — Z79899 Other long term (current) drug therapy: Secondary | ICD-10-CM | POA: Diagnosis not present

## 2019-04-19 LAB — URINALYSIS, ROUTINE W REFLEX MICROSCOPIC
Bilirubin Urine: NEGATIVE
Glucose, UA: NEGATIVE mg/dL
Ketones, ur: NEGATIVE mg/dL
Nitrite: POSITIVE — AB
Protein, ur: 30 mg/dL — AB
RBC / HPF: >50 RBC/hpf — ABNORMAL HIGH (ref 0–5)
Specific Gravity, Urine: 1.011 (ref 1.005–1.030)
WBC, UA: >50 WBC/hpf — ABNORMAL HIGH (ref 0–5)
pH: 7 (ref 5.0–8.0)

## 2019-04-19 LAB — BASIC METABOLIC PANEL
Anion gap: 5 (ref 5–15)
BUN: 8 mg/dL (ref 6–20)
CO2: 22 mmol/L (ref 22–32)
Calcium: 8.6 mg/dL — ABNORMAL LOW (ref 8.9–10.3)
Chloride: 110 mmol/L (ref 98–111)
Creatinine, Ser: 0.55 mg/dL (ref 0.44–1.00)
GFR calc Af Amer: >60 mL/min (ref >60)
GFR calc non Af Amer: >60 mL/min (ref >60)
Glucose, Bld: 123 mg/dL — ABNORMAL HIGH (ref 70–99)
Potassium: 3.9 mmol/L (ref 3.5–5.1)
Sodium: 137 mmol/L (ref 135–145)

## 2019-04-19 LAB — CBC
HCT: 35 % — ABNORMAL LOW (ref 36.0–46.0)
Hemoglobin: 11.3 g/dL — ABNORMAL LOW (ref 12.0–15.0)
MCH: 31.3 pg (ref 26.0–34.0)
MCHC: 32.3 g/dL (ref 30.0–36.0)
MCV: 97 fL (ref 80.0–100.0)
Platelets: 178 10*3/uL (ref 150–400)
RBC: 3.61 MIL/uL — ABNORMAL LOW (ref 3.87–5.11)
RDW: 13.6 % (ref 11.5–15.5)
WBC: 5.1 10*3/uL (ref 4.0–10.5)
nRBC: 0 % (ref 0.0–0.2)

## 2019-04-19 MED ORDER — SODIUM CHLORIDE 0.9 % IV SOLN: 1000.0000 mL | INTRAVENOUS | Status: DC

## 2019-04-19 MED ORDER — CEPHALEXIN 500 MG PO CAPS: 500.0000 mg | ORAL_CAPSULE | Freq: Two times a day (BID) | ORAL | 0 refills | Status: DC

## 2019-04-19 MED ORDER — SODIUM CHLORIDE 0.9 % IV BOLUS (SEPSIS): 500.0000 mL | Freq: Once | INTRAVENOUS | Status: DC

## 2019-04-19 MED ORDER — FENTANYL CITRATE (PF) 100 MCG/2ML IJ SOLN
50.0000 ug | Freq: Once | INTRAMUSCULAR | Status: AC
  Administered 2019-04-19: 50 ug via INTRAVENOUS
  Filled 2019-04-19: qty 2

## 2019-04-19 MED ORDER — FOSFOMYCIN TROMETHAMINE 3 G PO PACK
3.0000 g | PACK | Freq: Once | ORAL | Status: AC
  Administered 2019-04-19: 20:00:00 3 g via ORAL
  Filled 2019-04-19: qty 3

## 2019-04-19 NOTE — Telephone Encounter (Signed)
Appointment with provider tomorrow for concerns.

## 2019-04-19 NOTE — Telephone Encounter (Signed)
Copied from CRM [id number]. Topic: General - Other >> Apr 19, 2019 11:17 AM Jaquita Rector A wrote: Reason for CRM: Patient called to say that she is in the wheelchair about 6 hrs a day and now has sores on her buttock. She need something for her buttock also to possibly get a gel cushion to sit on while in the wheelchair. Ph# [phone]

## 2019-04-19 NOTE — Discharge Instructions (Signed)
Take the antibiotics as prescribed, apply a dressing over the decubitus ulcer to help relieve some of the pressure, follow-up with your doctor to discuss possible referral to a wound care center, monitor for fever worsening symptoms

## 2019-04-19 NOTE — ED Provider Notes (Signed)
[facility] DEPT Provider Note   CSN: [id number] Arrival date & time: 04/19/19  1541    History   Chief Complaint Chief Complaint  Patient presents with  . Tailbone Pain  . Abdominal Pain    HPI [name] [name] is a 43 y.o. female.     HPI Patient presents to the emergency room for evaluation of pain in her sacral region.  EMS reports indicated that she was having abdominal pain and sacral pain however the patient does me the reason she is here is for her sacral pain.  Abdominal pain is not her concern.  Obtaining history is somewhat difficult as her speech is difficult to understand with her cerebral palsy.  Patient does have a history of a decubitus ulcer.  Patient states she has tried applying creams but the pain is getting more severe.  It is very painful and tender to the touch.  She was at work today and it was very difficult for her to work.  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  She has an indwelling suprapubic catheter.  She has frequent urinary tract infections but denies any fevers or chills.  No vomiting or diarrhea. Past Medical History:  Diagnosis Date  . Acute respiratory failure with hypoxia ([place])   . Anemia   . Cellulitis 12/22/2014  . Cerebral palsy ([name])    Spastic Cerebral palsy, mentally intact  . Contracture, joint, multiple sites    Electric wheelchair, uses left hand to operate chair.   . Depression   . [name]   . Dysarthria   . GERD (gastroesophageal reflux disease)   . Gram positive sepsis ([place]) 10/24/2014  . HCAP (healthcare-associated pneumonia)    2015, 2018  . History of endometriosis 10/2012   OBGYN [facility]: Laparoscopic Endometrial Ablation, TVH,  . History of recurrent UTIs   . Hypertension   . Incontinent of feces 03/11/2016  . Intracervical pessary 05/07/2011   Overview:  Overview:  Placed by [facility] GYN 04/30/11 to treat DUB and Endometriosis.  She is seen  at GYN clinic at [facility] but was considered a poor surgical candidate and referred her to [facility].  For DUB she had pelvic ultrasound that showed thin stripe and she had endometrial Bx by Dr Nori Riis in [place] clinic, which was negative.     . Macroscopic hematuria 03/10/2013   status post replacement of suprapubic tube 03/04/2013   . Migraine   . OCD (obsessive compulsive disorder)   . Parapneumonic effusion 10/25/2014  . Presence of intrathecal baclofen pump 03/07/2014   R LQ. Insertion T10.    [name] Kitchen Psychiatric illness 10/31/2014  . Recurrent pulmonary embolism ([name])    Pt has history of recurrent PE and is on lifelong coumadin.   . Seizures ([place]) 02/12/2016  . Spasticity 01/05/2014  . Transient alteration of awareness, recurrent 09/08/2006   Recurrent episodes where [place]  loses contact with her world and that have been studied thoroughly and appear nonepileptic in nature per Dr Krista Blue (neurology) The patient has had episodes starting in 2007 that initially began 1-4 times a day during which time she would have periods of unawareness followed by confusion. This continuous video EEG monitoring performed from October 8-12, 2007. Had   . Urethra dilated and patulous 07/03/2015   Patulous, Dilated Urethra. 29m balloon on a 22-Fr catheter hoping this would prevent the bladder spasm from pushing the balloon down the urethra (Dr EAmalia Hailey [facility], July 2016)  Patient Active Problem List   Diagnosis Date Noted  . Palliative care encounter   . Cough 01/04/2019  . Bad taste in mouth 01/04/2019  . Hypotension 11/19/2018  . Complicated UTI (urinary tract infection) 11/18/2018  . Solitary pulmonary nodule on lung CT 10/28/2018  . Connective tissue disorder ([place]) 08/07/2018  . Migraine without aura and without status migrainosus, not intractable 06/26/2018  . UTI (urinary tract infection) 05/25/2018  . Headache 05/25/2018  . Incontinent of feces 03/11/2016  . Chronic anticoagulation 03/11/2016  . Involuntary  movements 02/22/2016  . Seizures ([place]) 02/12/2016  . Dyspnea 02/12/2016  . Cerebral palsy, quadriplegic ([place]) 02/06/2016  . Swelling of extremity 01/13/2016  . Nausea without vomiting 11/09/2015  . Fever   . Pressure ulcer 10/09/2015  . Status post insertion of inferior vena caval filter 09/28/2015  . Absence of bladder continence 08/25/2015  . Person living in residential institution 06/14/2015  . Pulmonary hypertension ([place])   . Anemia of chronic disease   . Spasticity 03/15/2015  . Constipation, chronic 10/10/2014  . Athetoid cerebral palsy ([place]) 04/13/2014  . Dystonia 04/12/2014  . UTI (lower urinary tract infection) 03/16/2014  . Presence of intrathecal baclofen pump 03/07/2014  . Dental caries 07/18/2013  . Multiple thyroid nodules 05/20/2013  . Osteoarthrosis 04/07/2013  . [name]   . Dysarthria   . GERD (gastroesophageal reflux disease)   . Neurogenic bladder 02/05/2013  . Anxiety state 10/21/2012  . Chronic pain syndrome 06/17/2012  . Recurrent pulmonary embolism ([place]) 05/03/2011  . Pulmonary embolism with infarction ([place]) 05/03/2011  . Chronic suprapubic catheter ([place]) 03/15/2011  . Disease of female genital organs 10/24/2010  . HYPERTENSION, BENIGN 01/31/2010  . CP (cerebral palsy), spastic ([place]) 12/27/2008  . Contracted, joint, multiple sites 12/27/2008  . Major depressive disorder, recurrent episode ([place]) 01/29/2007  . Obsessive Compulsive Disorder 01/29/2007  . Transient alteration of awareness, recurrent 09/08/2006    Past Surgical History:  Procedure Laterality Date  . ABDOMINAL HYSTERECTOMY    . APPENDECTOMY    . BACLOFEN PUMP REFILL     x 3 times  . CARPAL TUNNEL RELEASE  08/2008   Dr Daylene Katayama  . CESAREAN SECTION     x 2  . CHOLECYSTECTOMY  10/14/2015   Procedure: LAPAROSCOPIC CHOLECYSTECTOMY;  Surgeon: Georganna Skeans, MD;  Location: [facility];  Service: General;;  . COLPOSCOPY  06/2000  . INTRAUTERINE DEVICE INSERTION  04/30/11    Inserted by [facility] for endometriosis  . LAPAROSCOPIC ASSISTED VAGINAL HYSTERECTOMY  10/27/2012  . MULTIPLE EXTRACTIONS WITH ALVEOLOPLASTY Bilateral 07/19/2013   Procedure: EXTRACTIONS #4, 4,19,37,90;  Surgeon: Isac Caddy, DDS;  Location: [facility];  Service: Oral Surgery;  Laterality: Bilateral;  . NEUROMA SURGERY Left    anterior and posterior intersosseous  . PAIN PUMP IMPLANTATION N/A 03/07/2014   Procedure: baclofen pump revision/replacement and Catheter connection replacement;  Surgeon: Ophelia Charter, MD;  Location: [facility];  Service: Neurosurgery;  Laterality: N/A;  baclofen pump revision/replacement and Catheter connection replacement  . PROGRAMABLE BACLOFEN PUMP REVISION  03/17/14   Battery Replacement   . TUBAL LIGATION  2003  . URETHRA SURGERY    . WRIST SURGERY  06/2010   Dr Nicoletta Dress, hand surgeon, [name]     OB History   No obstetric history on file.      Home Medications    Prior to Admission medications   Medication Sig Start Date End Date Taking? Authorizing Provider  amantadine (SYMMETREL) 100 MG  capsule Take 100 mg by mouth daily.    Yes [provider]  azelastine (ASTELIN) 0.1 % nasal spray Place 2 sprays into both nostrils 2 (two) times daily. Use in each nostril as directed   Yes [provider]  baclofen (LIORESAL) 20 MG tablet Take 1 tablet (20 mg total) by mouth 4 (four) times daily. For bladder spasms 12/28/18  Yes Rockwell Germany, NP  BELBUCA 750 MCG FILM  04/05/19  Yes [provider]  busPIRone (BUSPAR) 5 MG tablet Take 5 mg by mouth 3 (three) times daily.   Yes [provider]  clonazePAM (KLONOPIN) 0.5 MG tablet Take 1.5 tablets (0.75 mg total) by mouth 3 (three) times daily. 11/27/17  Yes Mariel Aloe, MD  FLUoxetine (PROZAC) 40 MG capsule Take 80 mg by mouth daily.   Yes [provider]  guaiFENesin (MUCINEX) 600 MG 12 hr tablet Take 600 mg by mouth 2 (two) times daily as needed for cough  or to loosen phlegm.    Yes [provider]  hydroxychloroquine (PLAQUENIL) 200 MG tablet Take 200 mg by mouth daily.   Yes [provider]  lamoTRIgine (LAMICTAL) 100 MG tablet Take 100 mg by mouth at bedtime.   Yes [provider]  LINZESS 290 MCG CAPS capsule Take 290 mcg by mouth every morning. 02/24/19  Yes [provider]  Melatonin 5 MG TABS Take 5 mg by mouth at bedtime.    Yes [provider]  MOVANTIK 25 MG TABS tablet Take 25 mg by mouth daily. 04/05/19  Yes [provider]  omeprazole (PRILOSEC) 20 MG capsule Take 1 capsule (20 mg total) by mouth daily. 01/04/19  Yes Sagardia, Ines Bloomer, MD  ondansetron (ZOFRAN) 4 MG tablet Take 1 tablet (4 mg total) by mouth every 6 (six) hours as needed for nausea. 11/19/18  Yes Georgette Shell, MD  oxybutynin (DITROPAN) 5 MG tablet Take 5 mg by mouth 4 (four) times daily.  02/20/16  Yes [provider]  Oxycodone HCl 10 MG TABS Take 10 mg by mouth every 6 (six) hours as needed (pain).   Yes [provider]  polyethylene glycol powder (GLYCOLAX/MIRALAX) powder Take 1  1/2 dose  daily in 12 ounces of fluid every day. Patient taking differently: Take 25.5 g by mouth daily.  01/01/17  Yes Esterwood, [name] S, PA-C  QUEtiapine (SEROQUEL) 50 MG tablet Take 150 mg by mouth at bedtime.  01/08/19  Yes [provider]  rivaroxaban (XARELTO) 10 MG TABS tablet Take 10 mg by mouth every evening.    Yes [provider]  Skin Protectants, Misc. (EUCERIN) cream Apply topically as needed for wound care. Patient taking differently: Apply 1 application topically daily as needed for wound care.  02/05/19  Yes Albrizze, Kaitlyn E, PA-C  tiZANidine (ZANAFLEX) 4 MG capsule Take 1 capsule (4 mg total) by mouth 2 (two) times daily. 12/28/18  Yes Rockwell Germany, NP  topiramate (TOPAMAX) 100 MG tablet Take 1 tablet at bedtime Patient taking differently: Take 100 mg by mouth at bedtime.   02/10/19  Yes Rockwell Germany, NP  venlafaxine XR (EFFEXOR-XR) 37.5 MG 24 hr capsule Take 37.5 mg by mouth daily. 01/08/19  Yes [provider]  acetaminophen (TYLENOL 8 HOUR) 650 MG CR tablet Take 1 tablet (650 mg total) by mouth every 8 (eight) hours as needed. Patient taking differently: Take 1,300 mg by mouth 2 (two) times daily as needed for pain.  07/08/18   Nanavati, Ankit,  MD  cephALEXin (KEFLEX) 500 MG capsule Take 1 capsule (500 mg total) by mouth 2 (two) times daily. 04/19/19   Dorie Rank, MD  feeding supplement, ENSURE ENLIVE, (ENSURE ENLIVE) LIQD Take 237 mLs by mouth 2 (two) times daily between meals. Patient not taking: Reported on 04/19/2019 05/30/18   Kayleen Memos, DO  NARCAN 4 MG/0.1ML LIQD nasal spray kit Place 4 mg into the nose See admin instructions. 02/24/19   [provider]    Family History Family History  Problem Relation Age of Onset  . Asthma Father   . Colon cancer Maternal Grandmother        Died in her 32's  . Breast cancer Paternal Grandmother        Died in her 59's  . Heart attack Maternal Grandfather        Died in his 15's  . Alzheimer's disease Paternal Grandfather        Died in his 57's  . Breast cancer Mother   . Stomach cancer Neg Hx     Social History Social History   Tobacco Use  . Smoking status: Never Smoker  . Smokeless tobacco: Never Used  Substance Use Topics  . Alcohol use: Yes    Alcohol/week: 1.0 standard drinks    Types: 1 Glasses of wine per week    Comment: Drinks alcohol once a month.  . Drug use: No     Allergies   Morphine   Review of Systems Review of Systems  All other systems reviewed and are negative.    Physical Exam Updated Vital Signs BP 92/73   Pulse 78   Temp 97.9 F (36.6 C)   Resp 16   LMP 04/05/2011   SpO2 97%   Physical Exam Vitals signs and nursing note reviewed.  Constitutional:      General: She is not in acute distress.    Appearance: She is well-developed.  HENT:      Head: Normocephalic and atraumatic.     Right Ear: External ear normal.     Left Ear: External ear normal.  Eyes:     General: No scleral icterus.       Right eye: No discharge.        Left eye: No discharge.     Conjunctiva/sclera: Conjunctivae normal.  Neck:     Musculoskeletal: Neck supple.     Trachea: No tracheal deviation.  Cardiovascular:     Rate and Rhythm: Normal rate and regular rhythm.  Pulmonary:     Effort: Pulmonary effort is normal. No respiratory distress.     Breath sounds: Normal breath sounds. No stridor. No wheezing or rales.  Abdominal:     General: Bowel sounds are normal. There is no distension.     Palpations: Abdomen is soft.     Tenderness: There is no abdominal tenderness. There is no guarding or rebound.     Comments: Mechanical device palpated inferior abdominal wall  Genitourinary:    Comments: Stage II decubitus ulcer in the sacral region, tenderness to palpation, mild erythema, no purulent drainage Musculoskeletal:        General: No tenderness.  Skin:    General: Skin is warm and dry.     Findings: No rash.  Neurological:     Mental Status: She is alert.     Cranial Nerves: Dysarthria present. No cranial nerve deficit (no facial droop, extraocular movements intact,  ).     Sensory: No sensory deficit.  Motor: Weakness and abnormal muscle tone present. No seizure activity.     Coordination: Coordination abnormal.     Comments: Spasticity, contractures      ED Treatments / Results  Labs (all labs ordered are listed, but only abnormal results are displayed) Labs Reviewed  CBC - Abnormal; Notable for the following components:      Result Value   RBC 3.61 (*)    Hemoglobin 11.3 (*)    HCT 35.0 (*)    All other components within normal limits  BASIC METABOLIC PANEL - Abnormal; Notable for the following components:   Glucose, Bld 123 (*)    Calcium 8.6 (*)    All other components within normal limits  URINALYSIS, ROUTINE W REFLEX  MICROSCOPIC - Abnormal; Notable for the following components:   APPearance TURBID (*)    Hgb urine dipstick LARGE (*)    Protein, ur 30 (*)    Nitrite POSITIVE (*)    Leukocytes,Ua LARGE (*)    RBC / HPF >50 (*)    WBC, UA >50 (*)    Bacteria, UA MANY (*)    Non Squamous Epithelial 0-5 (*)    All other components within normal limits  URINE CULTURE    EKG None  Radiology No results found.  Procedures Procedures (including critical care time)  Medications Ordered in ED Medications  sodium chloride 0.9 % bolus 500 mL (has no administration in time range)    Followed by  0.9 %  sodium chloride infusion (has no administration in time range)  fosfomycin (MONUROL) packet 3 g (has no administration in time range)  fentaNYL (SUBLIMAZE) injection 50 mcg (50 mcg Intravenous Given 04/19/19 1728)     Initial Impression / Assessment and Plan / ED Course  I have reviewed the triage vital signs and the nursing notes.  Pertinent labs & imaging results that were available during my care of the patient were reviewed by me and considered in my medical decision making (see chart for details).  Clinical Course as of Apr 18 1905  Mon Apr 19, 2019  1807 Urinalysis suggest the possibility of urinary tract infection although this is difficult considering undergoing catheter.   [JK]    Clinical Course User Index [JK] Dorie Rank, MD     Patient presented to the emergency room for evaluation of a sore on her sacral region.  Patient has history of cerebral palsy.  She uses a wheelchair for mobility.  Her wound is consistent with a decubitus ulcer associated with her immobility.  No signs of surrounding cellulitis.  A dressing was applied to help with the discomfort.  Patient had some questionable urinary discomfort.  She has indwelling urinary catheter.  Her urinalysis does show yeast as well as bacteria.  This may be colonization but she has had urinary tract infections before.  She is afebrile.   She does not have leukocytosis.  Her blood pressure was low but she is very thin and at baseline she appears to have a lower blood pressure.  I doubt sepsis or systemic infection.  Patient appears stable for discharge.  Final Clinical Impressions(s) / ED Diagnoses   Final diagnoses:  Pressure injury of sacral region, stage 2 ([place])  Acute cystitis without hematuria    ED Discharge Orders         Ordered    cephALEXin (KEFLEX) 500 MG capsule  2 times daily     04/19/19 1905  Dorie Rank, MD 04/19/19 Darlin Drop

## 2019-04-19 NOTE — ED Notes (Signed)
[facility] notified for transport 

## 2019-04-19 NOTE — ED Triage Notes (Signed)
EMS reports from work, Pt called for increasing pain from sacral ulcers x 1 month, and lower abdominal pain from Foley cath.. Pt states has appt. tomorrow for same.  BP 104/68 HR 70 RR 20 Sp02 96 RA

## 2019-04-20 ENCOUNTER — Encounter: Payer: Self-pay | Admitting: Family Medicine

## 2019-04-20 ENCOUNTER — Other Ambulatory Visit: Payer: Self-pay

## 2019-04-20 ENCOUNTER — Ambulatory Visit (INDEPENDENT_AMBULATORY_CARE_PROVIDER_SITE_OTHER): Payer: Medicare Other | Admitting: Family Medicine

## 2019-04-20 VITALS — BP 108/70 | Temp 98.0°F | Resp 18

## 2019-04-20 DIAGNOSIS — L89302 Pressure ulcer of unspecified buttock, stage 2: Secondary | ICD-10-CM | POA: Diagnosis not present

## 2019-04-20 DIAGNOSIS — N390 Urinary tract infection, site not specified: Secondary | ICD-10-CM | POA: Diagnosis not present

## 2019-04-20 DIAGNOSIS — T83511S Infection and inflammatory reaction due to indwelling urethral catheter, sequela: Secondary | ICD-10-CM | POA: Diagnosis not present

## 2019-04-20 LAB — URINE CULTURE

## 2019-04-20 NOTE — Patient Instructions (Addendum)
   Home health referral for ulcers, uti Pt will call urology for follow up If you have lab work done today you will be contacted with your lab results within the next 2 weeks.  If you have not heard from [place] then please contact us. The fastest way to get your results is to register for My Chart.   IF you received an x-ray today, you will receive an invoice from [facility]. Please contact [facility] at [phone] with questions or concerns regarding your invoice.   IF you received labwork today, you will receive an invoice from [facility]. Please contact [facility] at [phone] with questions or concerns regarding your invoice.   Our billing staff will not be able to assist you with questions regarding bills from these companies.  You will be contacted with the lab results as soon as they are available. The fastest way to get your results is to activate your My Chart account. Instructions are located on the last page of this paperwork. If you have not heard from [place] regarding the results in 2 weeks, please contact this office.

## 2019-04-20 NOTE — [facility] Notes (Signed)
Acute Office Visit  Subjective:    Patient ID: [name] [name], female    DOB: Jul 18, 1976, 43 y.o.   MRN: [id number]  Chief Complaint  Patient presents with  . Pressure Ulcer    pt states she has bed sore on her bottom that is giving her pain. Pt states the sore has been present for 1 month     HPI Patient is in today for follow up-decubital ulcers noted in ER yesterday and possible UTI-indwelling cath. Seen by urology 2 weeks ago with cath change. No fever, no abdominal pain. Ongoing concern for 1 month-worsening. Pt states she needs a new pad.   Past Medical History:  Diagnosis Date  . Acute respiratory failure with hypoxia ([place])   . Anemia   . Cellulitis 12/22/2014  . Cerebral palsy ([name])    Spastic Cerebral palsy, mentally intact  . Contracture, joint, multiple sites    Electric wheelchair, uses left hand to operate chair.   . Depression   . [name]   . Dysarthria   . GERD (gastroesophageal reflux disease)   . Gram positive sepsis ([place]) 10/24/2014  . HCAP (healthcare-associated pneumonia)    2015, 2018  . History of endometriosis 10/2012   OBGYN [facility]: Laparoscopic Endometrial Ablation, TV[facility],  . History of recurrent UTIs   . Hypertension   . Incontinent of feces 03/11/2016  . Intracervical pessary 05/07/2011   Overview:  Overview:  Placed by [facility]YN 04/30/11 to treat DUB and Endometriosis.  She is seen at GYN clinic at [facility] but was considered a poor surgical candidate and referred her to [facility].  For DUB she had pelvic ultrasound that showed thin stripe and she had endometrial Bx by Dr Nori Riis in [place] clinic, which was negative.     . Macroscopic hematuria 03/10/2013   status post replacement of suprapubic tube 03/04/2013   . Migraine   . OCD (obsessive compulsive disorder)   . Parapneumonic effusion 10/25/2014  . Presence of intrathecal baclofen pump 03/07/2014   R LQ. Insertion T10.    [name] Kitchen Psychiatric illness 10/31/2014  . Recurrent pulmonary  embolism ([name])    Pt has history of recurrent PE and is on lifelong coumadin.   . Seizures ([place]) 02/12/2016  . Spasticity 01/05/2014  . Transient alteration of awareness, recurrent 09/08/2006   Recurrent episodes where [name] [name]  loses contact with her world and that have been studied thoroughly and appear nonepileptic in nature per Dr Krista Blue (neurology) The patient has had episodes starting in 2007 that initially began 1-4 times a day during which time she would have periods of unawareness followed by confusion. This continuous video EEG monitoring performed from October 8-12, 2007. Had   . Urethra dilated and patulous 07/03/2015   Patulous, Dilated Urethra. 73m balloon on a 22-Fr catheter hoping this would prevent the bladder spasm from pushing the balloon down the urethra (Dr EAmalia Hailey [facility], July 2016)     Past Surgical History:  Procedure Laterality Date  . ABDOMINAL HYSTERECTOMY    . APPENDECTOMY    . BACLOFEN PUMP REFILL     x 3 times  . CARPAL TUNNEL RELEASE  08/2008   Dr SDaylene Katayama . CESAREAN SECTION     x 2  . CHOLECYSTECTOMY  10/14/2015   Procedure: LAPAROSCOPIC CHOLECYSTECTOMY;  Surgeon: BGeorganna Skeans MD;  [place] [facility]  Service: General;;  . COLPOSCOPY  06/2000  . INTRAUTERINE DEVICE INSERTION  04/30/11   [place]  . LAPAROSCOPIC ASSISTED VAGINAL HYSTERECTOMY  10/27/2012  . MULTIPLE EXTRACTIONS WITH ALVEOLOPLASTY Bilateral 07/19/2013   Procedure: EXTRACTIONS #4, 9,76,73,41;  Surgeon: Isac Caddy, DDS;  Location: [facility];  Service: Oral Surgery;  Laterality: Bilateral;  . NEUROMA SURGERY Left    anterior and posterior intersosseous  . PAIN PUMP IMPLANTATION N/A 03/07/2014   Procedure: baclofen pump revision/replacement and Catheter connection replacement;  Surgeon: Ophelia Charter, MD;  Location: [facility];  Service: Neurosurgery;  Laterality: N/A;  baclofen pump revision/replacement and Catheter connection replacement  .  PROGRAMABLE BACLOFEN PUMP REVISION  03/17/14   Battery Replacement   . TUBAL LIGATION  2003  . URETHRA SURGERY    . WRIST SURGERY  06/2010   Dr Nicoletta Dress, hand surgeon, [facility]    Family History  Problem Relation Age of Onset  . Asthma Father   . Colon cancer Maternal Grandmother        Died in her 14's  . Breast cancer Paternal Grandmother        Died in her 27's  . Heart attack Maternal Grandfather        Died in his 28's  . Alzheimer's disease Paternal Grandfather        Died in his 87's  . Breast cancer Mother   . Stomach cancer Neg Hx     Social History   Socioeconomic History  . Marital status: Divorced    Spouse name: Not on file  . Number of children: 2  . Years of education: college  . Highest education level: Not on file  Occupational History    Employer: UNEMPLOYED  Social Needs  . Financial resource strain: Not on file  . Food insecurity:    Worry: Not on file    Inability: Not on file  . Transportation needs:    Medical: Not on file    Non-medical: Not on file  Tobacco Use  . Smoking status: Never Smoker  . Smokeless tobacco: Never Used  Substance and Sexual Activity  . Alcohol use: Yes    Alcohol/week: 1.0 standard drinks    Types: 1 Glasses of wine per week    Comment: Drinks alcohol once a month.  . Drug use: No  . Sexual activity: Never  Lifestyle  . Physical activity:    Days per week: Not on file    Minutes per session: Not on file  . Stress: Not on file  Relationships  . Social connections:    Talks on phone: Not on file    Gets together: Not on file    Attends religious service: Not on file    Active member of club or organization: Not on file    Attends meetings of clubs or organizations: Not on file    Relationship status: Not on file  . Intimate partner violence:    Fear of current or ex partner: Not on file    Emotionally abused: Not on file    Physically abused: Not on file    Forced sexual activity: Not on file  Other Topics  Concern  . 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    She enjoys shopping.   Lives in a [place] since 02/11/16      Delrae Sawyers, who has schizophrenia, MR, is father of daughter.  Pilar Plate and pt are no longer together.  Pilar Plate does not see Cleotis Lema. Daughter  is 83 yo, Nat Math, lives in a group home in [address].    [name]'s mother reportedly has breast cancer.    Needs assistance with ADL's and IADL's--has personal care services 20 hrs/wk;       No tobacco, EtOH, drugs.      **Case Manager: Maia Petties [phone]      Patient lives [facility]    Outpatient Medications Prior to Visit  Medication Sig Dispense Refill  . acetaminophen (TYLENOL 8 HOUR) 650 MG CR tablet Take 1 tablet (650 mg total) by mouth every 8 (eight) hours as needed. (Patient taking differently: Take 1,300 mg by mouth 2 (two) times daily as needed for pain. ) 30 tablet 0  . amantadine (SYMMETREL) 100 MG capsule Take 100 mg by mouth daily.     [name] Kitchen azelastine (ASTELIN) 0.1 % nasal spray Place 2 sprays into both nostrils 2 (two) times daily. Use in each nostril as directed    . baclofen (LIORESAL) 20 MG tablet Take 1 tablet (20 mg total) by mouth 4 (four) times daily. For bladder spasms 30 each 2  . BELBUCA 750 MCG FILM     . busPIRone (BUSPAR) 5 MG tablet Take 5 mg by mouth 3 (three) times daily.    . cephALEXin (KEFLEX) 500 MG capsule Take 1 capsule (500 mg total) by mouth 2 (two) times daily. 14 capsule 0  . clonazePAM (KLONOPIN) 0.5 MG tablet Take 1.5 tablets (0.75 mg total) by mouth 3 (three) times daily. 15 tablet 0  . feeding supplement, ENSURE ENLIVE, (ENSURE ENLIVE) LIQD Take 237 mLs by mouth 2 (two) times daily between meals. 237 mL 0  . FLUoxetine (PROZAC) 40 MG capsule Take 80 mg by mouth daily.    [name] Kitchen guaiFENesin (MUCINEX) 600 MG 12 hr tablet Take 600 mg by mouth 2 (two) times  daily as needed for cough or to loosen phlegm.     . hydroxychloroquine (PLAQUENIL) 200 MG tablet Take 200 mg by mouth daily.    [name] Kitchen lamoTRIgine (LAMICTAL) 100 MG tablet Take 100 mg by mouth at bedtime.    [name] Kitchen LINZESS 290 MCG CAPS capsule Take 290 mcg by mouth every morning.    . Melatonin 5 MG TABS Take 5 mg by mouth at bedtime.     [name] Kitchen MOVANTIK 25 MG TABS tablet Take 25 mg by mouth daily.    [name] Kitchen NARCAN 4 MG/0.1ML LIQD nasal spray kit Place 4 mg into the nose See admin instructions.    [name] Kitchen omeprazole (PRILOSEC) 20 MG capsule Take 1 capsule (20 mg total) by mouth daily. 30 capsule 3  . ondansetron (ZOFRAN) 4 MG tablet Take 1 tablet (4 mg total) by mouth every 6 (six) hours as needed for nausea. 20 tablet 0  . oxybutynin (DITROPAN) 5 MG tablet Take 5 mg by mouth 4 (four) times daily.     . Oxycodone HCl 10 MG TABS Take 10 mg by mouth every 6 (six) hours as needed (pain).    . polyethylene glycol powder (GLYCOLAX/MIRALAX) powder Take 1  1/2 dose  daily in 12 ounces of fluid every day. (Patient taking differently: Take 25.5 g by mouth daily. ) 255 g 3  . QUEtiapine (SEROQUEL) 50 MG tablet Take 150 mg by mouth at bedtime.     . rivaroxaban (XARELTO) 10 MG TABS tablet Take 10 mg by mouth every evening.     . Skin Protectants, Misc. (EUCERIN) cream Apply topically as needed for wound care. (Patient taking differently: Apply 1 application topically  daily as needed for wound care. ) 397 g 0  . tiZANidine (ZANAFLEX) 4 MG capsule Take 1 capsule (4 mg total) by mouth 2 (two) times daily. 60 capsule 2  . topiramate (TOPAMAX) 100 MG tablet Take 1 tablet at bedtime (Patient taking differently: Take 100 mg by mouth at bedtime. ) 30 tablet 5  . venlafaxine XR (EFFEXOR-XR) 37.5 MG 24 hr capsule Take 37.5 mg by mouth daily.     No facility-administered medications prior to visit.     Allergies  Allergen Reactions  . Morphine Dermatitis and Other (See Comments)    Skin turned red     ROS CONSTITUTIONAL: no  fever  GU: no pain with urination-indwelling cath INTEG: decubital ulcer per ER     Objective:    Physical Exam  Constitutional: She appears distressed.  pt reports being seen in ER for sacral pain-diagnosis decubital ulcer-lifted for exam Discussed with pt for 11mn concerns due to ulcer x 1 month-stage 2 and needs for close follow up and care. Pt has a care giver but is unclear if they would be able to assist with wound care. Pt sees urology and is concerned oral medication will not cure infection. Culture pending.  BP 108/70   Temp 98 F (36.7 C) (Oral)   Resp 18   LMP 04/05/2011   SpO2 96%  Wt Readings from Last 3 Encounters:  02/17/19 101 lb 13.6 oz (46.2 kg)  01/30/19 103 lb 9.9 oz (47 kg)  01/13/19 103 lb 9.9 oz (47 kg)    Health Maintenance Due  Topic Date Due  . PAP SMEAR-Modifier  11/12/2013    Lab Results  Component Value Date   TSH 0.710 06/12/2015   Lab Results  Component Value Date   WBC 5.1 04/19/2019   HGB 11.3 (L) 04/19/2019   HCT 35.0 (L) 04/19/2019   MCV 97.0 04/19/2019   PLT 178 04/19/2019   Lab Results  Component Value Date   NA 137 04/19/2019   K 3.9 04/19/2019   CO2 22 04/19/2019   GLUCOSE 123 (H) 04/19/2019   BUN 8 04/19/2019   CREATININE 0.55 04/19/2019   BILITOT 0.7 03/25/2019   ALKPHOS 68 03/25/2019   AST 14 (L) 03/25/2019   ALT 10 03/25/2019   PROT 6.7 03/25/2019   ALBUMIN 3.8 03/25/2019   CALCIUM 8.6 (L) 04/19/2019   ANIONGAP 5 04/19/2019   Lab Results  Component Value Date   CHOL 150 01/29/2007   Lab Results  Component Value Date   HDL 41 01/29/2007   Lab Results  Component Value Date   LDLCALC 92 01/29/2007   Lab Results  Component Value Date   TRIG 83 01/29/2007   Lab Results  Component Value Date   CHOLHDL 3.7 Ratio 01/29/2007   Lab Results  Component Value Date   HGBA1C 6.0 07/25/2009       Assessment & Plan:  1. Pressure injury of buttock, stage 2, unspecified laterality ([place] [facility] to assist with  continued observation and treatment-pt has agreed to home health care  - Ambulatory referral to [facility] 2. Urinary tract infection associated with indwelling urethral catheter, sequela Culture pending-pt to call urology for follow up as worried she needs IV meds to cure - Ambulatory referral to Home Health  Azaylia Fong LHannah Beat MD

## 2019-05-05 ENCOUNTER — Ambulatory Visit (INDEPENDENT_AMBULATORY_CARE_PROVIDER_SITE_OTHER): Payer: Medicare Other | Admitting: Family Medicine

## 2019-05-05 ENCOUNTER — Other Ambulatory Visit: Payer: Self-pay

## 2019-05-05 VITALS — BP 102/70 | HR 88 | Temp 98.2°F

## 2019-05-05 DIAGNOSIS — G808 Other cerebral palsy: Secondary | ICD-10-CM | POA: Diagnosis not present

## 2019-05-05 DIAGNOSIS — Z978 Presence of other specified devices: Secondary | ICD-10-CM

## 2019-05-05 DIAGNOSIS — L89302 Pressure ulcer of unspecified buttock, stage 2: Secondary | ICD-10-CM | POA: Diagnosis not present

## 2019-05-05 DIAGNOSIS — Z960 Presence of urogenital implants: Secondary | ICD-10-CM | POA: Diagnosis not present

## 2019-05-05 NOTE — [facility] Notes (Signed)
Talked with Lowella Bandy with [facility] [place] [facility] [phone] about referral. She states that "they have not received referral. Referral has been resent. Lowella Bandy state that she will look into referral and call back and will send someone over to pick up referral

## 2019-05-05 NOTE — Patient Instructions (Signed)
° ° ° °  If you have lab work done today you will be contacted with your lab results within the next 2 weeks.  If you have not heard from us then please contact us. The fastest way to get your results is to register for My Chart. ° ° °IF you received an x-ray today, you will receive an invoice from [facility]. Please contact [facility] at [phone] with questions or concerns regarding your invoice.  ° °IF you received labwork today, you will receive an invoice from [facility]. Please contact [facility] at [phone] with questions or concerns regarding your invoice.  ° °Our billing staff will not be able to assist you with questions regarding bills from these companies. ° °You will be contacted with the lab results as soon as they are available. The fastest way to get your results is to activate your My Chart account. Instructions are located on the last page of this paperwork. If you have not heard from us regarding the results in 2 weeks, please contact this office. °  ° ° ° °

## 2019-05-06 NOTE — Telephone Encounter (Signed)
Call to [facility]. Lowella Bandy confirmed that the referral fax was received and is complete.  No further info needed.  Expect to start with patient tomorrow 05/07/2019.

## 2019-05-06 NOTE — Progress Notes (Addendum)
Acute Office Visit  Subjective:    Patient ID: [name] [name], female    DOB: 06-21-1976, 43 y.o.   MRN: [id number]  Chief Complaint  Patient presents with  . Referral    need bed that goes up and down, home health (cna) and physical therapy   . Wound Check    on butt    HPI Patient is in today for [place] referral-pt was never contacted by [facility] for follow up visit. Pt with continued decubitus -sacrum -ulcers. Seen in ER for evaluation with f/u in clinic for referral. Pt with 5/28 replacement of cath by urology. Pt here today with new caregiver. Pt recently moved in to caregivers home. No change in appetite. No fever. Pt states ulcers are less painful but concern for care since no wound care evaluation. Pt uses a wheel chair and has a bed that can incline head and feet. Care giver requesting additional equipment to care for pt.   Past Medical History:  Diagnosis Date  . Acute respiratory failure with hypoxia ([place])   . Anemia   . Cellulitis 12/22/2014  . Cerebral palsy ([name])    Spastic Cerebral palsy, mentally intact  . Contracture, joint, multiple sites    Electric wheelchair, uses left hand to operate chair.   . Depression   . [name]   . Dysarthria   . GERD (gastroesophageal reflux disease)   . Gram positive sepsis ([place]) 10/24/2014  . HCAP (healthcare-associated pneumonia)    2015, 2018  . History of endometriosis 10/2012   OBGYN [facility]: Laparoscopic Endometrial Ablation, TVH,  . History of recurrent UTIs   . Hypertension   . Incontinent of feces 03/11/2016  . Intracervical pessary 05/07/2011   Overview:  Overview:  Placed by [facility] 04/30/11 to treat DUB and Endometriosis.  She is seen at GYN clinic at [facility] but was considered a poor surgical candidate and referred her to [facility].  For DUB she had pelvic ultrasound that showed thin stripe and she had endometrial Bx by Dr Nori Riis in [place] clinic, which was negative.     . Macroscopic hematuria 03/10/2013   status post replacement of suprapubic tube 03/04/2013   . Migraine   . OCD (obsessive compulsive disorder)   . Parapneumonic effusion 10/25/2014  . Presence of intrathecal baclofen pump 03/07/2014   R LQ. Insertion T10.    [name] Kitchen Psychiatric illness 10/31/2014  . Recurrent pulmonary embolism ([name])    Pt has history of recurrent PE and is on lifelong coumadin.   . Seizures ([place]) 02/12/2016  . Spasticity 01/05/2014  . Transient alteration of awareness, recurrent 09/08/2006   Recurrent episodes where Karlen  loses contact with her world and that have been studied thoroughly and appear nonepileptic in nature per Dr Krista Blue (neurology) The patient has had episodes starting in 2007 that initially began 1-4 times a day during which time she would have periods of unawareness followed by confusion. This continuous video EEG monitoring performed from October 8-12, 2007. Had   . Urethra dilated and patulous 07/03/2015   Patulous, Dilated Urethra. 47m balloon on a 22-Fr catheter hoping this would prevent the bladder spasm from pushing the balloon down the urethra (Dr EAmalia Hailey [facility], July 2016)     Past Surgical History:  Procedure Laterality Date  . ABDOMINAL HYSTERECTOMY    . APPENDECTOMY    . BACLOFEN PUMP REFILL     x 3 times  . CARPAL TUNNEL RELEASE  08/2008   Dr  Sypher  . CESAREAN SECTION     x 2  . CHOLECYSTECTOMY  10/14/2015   Procedure: LAPAROSCOPIC CHOLECYSTECTOMY;  Surgeon: Georganna Skeans, MD;  Location: [facility];  Service: General;;  . COLPOSCOPY  06/2000  . INTRAUTERINE DEVICE INSERTION  04/30/11   Inserted by [place] for endometriosis  . LAPAROSCOPIC ASSISTED VAGINAL HYSTERECTOMY  10/27/2012  . MULTIPLE EXTRACTIONS WITH ALVEOLOPLASTY Bilateral 07/19/2013   Procedure: EXTRACTIONS #4, 9,56,21,30;  Surgeon: Isac Caddy, DDS;  Location: [facility];  Service: Oral Surgery;  Laterality: Bilateral;  . NEUROMA SURGERY Left    anterior and posterior intersosseous  . PAIN PUMP IMPLANTATION N/A  03/07/2014   Procedure: baclofen pump revision/replacement and Catheter connection replacement;  Surgeon: Ophelia Charter, MD;  Location: [facility];  Service: Neurosurgery;  Laterality: N/A;  baclofen pump revision/replacement and Catheter connection replacement  . PROGRAMABLE BACLOFEN PUMP REVISION  03/17/14   Battery Replacement   . TUBAL LIGATION  2003  . URETHRA SURGERY    . WRIST SURGERY  06/2010   Dr Nicoletta Dress, hand surgeon, [facility]    Family History  Problem Relation Age of Onset  . Asthma Father   . Colon cancer Maternal Grandmother        Died in her 63's  . Breast cancer Paternal Grandmother        Died in her 65's  . Heart attack Maternal Grandfather        Died in his 31's  . Alzheimer's disease Paternal Grandfather        Died in his 79's  . Breast cancer Mother   . Stomach cancer Neg Hx     Social History   Socioeconomic History  . Marital status: Divorced    Spouse name: Not on file  . Number of children: 2  . Years of education: college  . Highest education level: Not on file  Occupational History    Employer: UNEMPLOYED  Social Needs  . Financial resource strain: Not on file  . Food insecurity:    Worry: Not on file    Inability: Not on file  . Transportation needs:    Medical: Not on file    Non-medical: Not on file  Tobacco Use  . Smoking status: Never Smoker  . Smokeless tobacco: Never Used  Substance and Sexual Activity  . Alcohol use: Yes    Alcohol/week: 1.0 standard drinks    Types: 1 Glasses of wine per week    Comment: Drinks alcohol once a month.  . Drug use: No  . Sexual activity: Never  Lifestyle  . Physical activity:    Days per week: Not on file    Minutes per session: Not on file  . Stress: Not on file  Relationships  . Social connections:    Talks on phone: Not on file    Gets together: Not on file    Attends religious service: Not on file    Active member of club or organization: Not on file    Attends meetings of clubs or  organizations: Not on file    Relationship status: Not on file  . Intimate partner violence:    Fear of current or ex partner: Not on file    Emotionally abused: Not on file    Physically abused: Not on file    Forced sexual activity: Not on file  Other Topics Concern  . 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    She enjoys shopping.   Lives in a [place] since 02/11/16      Delrae Sawyers, who has schizophrenia, MR, is father of daughter.  Pilar Plate and pt are no longer together.  Pilar Plate does not see Cleotis Lema. Daughter is 33 yo, Nat Math, lives in a group home in [place].    [name]'s mother reportedly has breast cancer.    Needs assistance with ADL's and IADL's--has personal care services 20 hrs/wk;       No tobacco, EtOH, drugs.      **Case Manager: Maia Petties [phone]      Patient lives [facility]    Outpatient Medications Prior to Visit  Medication Sig Dispense Refill  . acetaminophen (TYLENOL 8 HOUR) 650 MG CR tablet Take 1 tablet (650 mg total) by mouth every 8 (eight) hours as needed. (Patient taking differently: Take 1,300 mg by mouth 2 (two) times daily as needed for pain. ) 30 tablet 0  . amantadine (SYMMETREL) 100 MG capsule Take 100 mg by mouth daily.     [name] Kitchen azelastine (ASTELIN) 0.1 % nasal spray Place 2 sprays into both nostrils 2 (two) times daily. Use in each nostril as directed    . baclofen (LIORESAL) 20 MG tablet Take 1 tablet (20 mg total) by mouth 4 (four) times daily. For bladder spasms 30 each 2  . BELBUCA 750 MCG FILM     . busPIRone (BUSPAR) 5 MG tablet Take 5 mg by mouth 3 (three) times daily.    . cephALEXin (KEFLEX) 500 MG capsule Take 1 capsule (500 mg total) by mouth 2 (two) times daily. 14 capsule 0  . clonazePAM (KLONOPIN) 0.5 MG tablet Take 1.5 tablets (0.75 mg total) by mouth 3 (three) times daily. 15 tablet 0  .  feeding supplement, ENSURE ENLIVE, (ENSURE ENLIVE) LIQD Take 237 mLs by mouth 2 (two) times daily between meals. 237 mL 0  . FLUoxetine (PROZAC) 40 MG capsule Take 80 mg by mouth daily.    [name] Kitchen guaiFENesin (MUCINEX) 600 MG 12 hr tablet Take 600 mg by mouth 2 (two) times daily as needed for cough or to loosen phlegm.     . hydroxychloroquine (PLAQUENIL) 200 MG tablet Take 200 mg by mouth daily.    [name] Kitchen lamoTRIgine (LAMICTAL) 100 MG tablet Take 100 mg by mouth at bedtime.    [name] Kitchen LINZESS 290 MCG CAPS capsule Take 290 mcg by mouth every morning.    . Melatonin 5 MG TABS Take 5 mg by mouth at bedtime.     [name] Kitchen MOVANTIK 25 MG TABS tablet Take 25 mg by mouth daily.    [name] Kitchen NARCAN 4 MG/0.1ML LIQD nasal spray kit Place 4 mg into the nose See admin instructions.    [name] Kitchen omeprazole (PRILOSEC) 20 MG capsule Take 1 capsule (20 mg total) by mouth daily. 30 capsule 3  . ondansetron (ZOFRAN) 4 MG tablet Take 1 tablet (4 mg total) by mouth every 6 (six) hours as needed for nausea. 20 tablet 0  . oxybutynin (DITROPAN) 5 MG tablet Take 5 mg by mouth 4 (four) times daily.     . Oxycodone HCl 10 MG TABS Take 10 mg by mouth every 6 (six) hours as needed (pain).    . polyethylene glycol powder (GLYCOLAX/MIRALAX) powder Take 1  1/2 dose  daily in 12 ounces of fluid every day. (Patient taking differently: Take 25.5 g by mouth daily. ) 255 g 3  . QUEtiapine (  SEROQUEL) 50 MG tablet Take 150 mg by mouth at bedtime.     . rivaroxaban (XARELTO) 10 MG TABS tablet Take 10 mg by mouth every evening.     . Skin Protectants, Misc. (EUCERIN) cream Apply topically as needed for wound care. (Patient taking differently: Apply 1 application topically daily as needed for wound care. ) 397 g 0  . tiZANidine (ZANAFLEX) 4 MG capsule Take 1 capsule (4 mg total) by mouth 2 (two) times daily. 60 capsule 2  . topiramate (TOPAMAX) 100 MG tablet Take 1 tablet at bedtime (Patient taking differently: Take 100 mg by mouth at bedtime. ) 30 tablet 5  . venlafaxine XR  (EFFEXOR-XR) 37.5 MG 24 hr capsule Take 37.5 mg by mouth daily.     No facility-administered medications prior to visit.     Allergies  Allergen Reactions  . Morphine Dermatitis and Other (See Comments)    Skin turned red     Review of Systems  Constitutional: Negative for fever.  HENT: Negative for congestion.   Respiratory: Negative for cough.   Gastrointestinal: Negative for constipation, diarrhea and heartburn.  ulcers -lower back-less painful then when asked at previous visit, no bleeding noted from site Pt disheveled appearance with clothing noted to have dry food on shirt and crumbs in lap. When pt was assisted in standing food fell on the floor from pts lap.    Objective:    Physical Exam  Constitutional: No distress.  Cardiovascular: Normal rate and regular rhythm.  Pulmonary/Chest: Effort normal.  Musculoskeletal:        General: Deformity present.  Neurological: She is alert.  Skin: There is erythema.  stage 2 ulcers-sacrum Pt disheveled appearance with clothing noted to have dry food on shirt and crumbs in lap. When pt was assisted in standing food fell on the floor from pts lap.  BP 102/70   Pulse 88   Temp 98.2 F (36.8 C) (Oral)   LMP 04/05/2011   SpO2 98%  Wt Readings from Last 3 Encounters:  02/17/19 101 lb 13.6 oz (46.2 kg)  01/30/19 103 lb 9.9 oz (47 kg)  01/13/19 103 lb 9.9 oz (47 kg)    Health Maintenance Due  Topic Date Due  . PAP SMEAR-Modifier  11/12/2013      Lab Results  Component Value Date   TSH 0.710 06/12/2015   Lab Results  Component Value Date   WBC 5.1 04/19/2019   HGB 11.3 (L) 04/19/2019   HCT 35.0 (L) 04/19/2019   MCV 97.0 04/19/2019   PLT 178 04/19/2019   Lab Results  Component Value Date   NA 137 04/19/2019   K 3.9 04/19/2019   CO2 22 04/19/2019   GLUCOSE 123 (H) 04/19/2019   BUN 8 04/19/2019   CREATININE 0.55 04/19/2019   BILITOT 0.7 03/25/2019   ALKPHOS 68 03/25/2019   AST 14 (L) 03/25/2019   ALT 10  03/25/2019   PROT 6.7 03/25/2019   ALBUMIN 3.8 03/25/2019   CALCIUM 8.6 (L) 04/19/2019   ANIONGAP 5 04/19/2019   Lab Results  Component Value Date   CHOL 150 01/29/2007   Lab Results  Component Value Date   HDL 41 01/29/2007   Lab Results  Component Value Date   LDLCALC 92 01/29/2007   Lab Results  Component Value Date   TRIG 83 01/29/2007   Lab Results  Component Value Date   CHOLHDL 3.7 Ratio 01/29/2007   Lab Results  Component Value Date   HGBA1C 6.0 07/25/2009  Assessment & Plan:  1. Pressure injury of buttock, stage 2, unspecified laterality ([place]) HHC did not receive referral-contacted by phone with confirmation of paperwork. Will pick up paperwork for referral tomorrow-pt understands [place] to visit for evaluation and recommendations for treatment with appropriate equipment/supplies.   2. Indwelling Foley catheter present Small amount of leakage at site. No eryth. Recent change by urology  3. Cerebral palsy, quadriplegic ([place]) Concern by caregiver for equipment needed in the home-bed, chair and additional services for PT/OT. Of concern- pt and caregiver did not give explanation for move of pt from old living situation to current living situation.  Caregiver became hostile with questions directed to her about living arrangements and transfer of care of pt to her home. Care giver is not a family member or previous friend of the pt.  Care giver drove separate from patient and pt asked not to be placed into a room until caregiver arrived. Pt arrived by SCAT.  Pt was able to stand to examine ulcers with assistance from CME and physician. Caregiver did not offer to assist with positional change with pt.    Will d/w Dr. [name] additional services/equipment requested by pts new caregiver.   Doran Clay CMA was in the exam room for our entire visit.   05-11-19-notified that pt will not qualify for [facility] for sacral decubital ulcers as she works outside the  [employer] for outpatient wound care  Hannah Beat, MD

## 2019-05-07 ENCOUNTER — Telehealth: Payer: Self-pay | Admitting: Family Medicine

## 2019-05-07 NOTE — Telephone Encounter (Signed)
I called [facility] at [phone] but the office was closed. They have an after hour service that answer their phone which states that they are unable to help me and that I would need to call back M-F 8 am-5 pm

## 2019-05-07 NOTE — Telephone Encounter (Signed)
Copied from CRM [id number]. Topic: General - Other >> May 07, 2019  9:06 AM Gwenlyn Fudge A wrote: Reason for CRM: Mo, from [facility] home [facility], called stating they cannot provide services to pt. Mo states pt is not home bound and pt cannot be reached by phone. Please advise.

## 2019-05-11 NOTE — Addendum Note (Signed)
Addended by: Maryruth Hancock on: 05/11/2019 05:59 PM   Modules accepted: Orders

## 2019-05-11 NOTE — Telephone Encounter (Signed)
Call to [facility], spoke with Lexine Baton re denial of home wound care services.  Lexine Baton stated pt reported having a job, working [employer].  Based on her ability to leave the house for work, she does not qualify for [facility] wound care per Medicare guidelines.  Stated they spoke to pt's roommate re other services available but that roommate seemed resistant.  Provider notified and will enter outpatient orders.

## 2019-05-12 ENCOUNTER — Other Ambulatory Visit: Payer: Self-pay

## 2019-05-12 ENCOUNTER — Emergency Department (HOSPITAL_COMMUNITY)
Admission: EM | Admit: 2019-05-12 | Discharge: 2019-05-12 | Disposition: A | Discharge status: Home / Self Care | Payer: Medicare Other | Attending: Emergency Medicine | Admitting: Emergency Medicine

## 2019-05-12 ENCOUNTER — Ambulatory Visit: Payer: Medicare Other | Admitting: Family Medicine

## 2019-05-12 ENCOUNTER — Encounter (HOSPITAL_COMMUNITY): Payer: Self-pay | Admitting: Obstetrics and Gynecology

## 2019-05-12 ENCOUNTER — Emergency Department (HOSPITAL_COMMUNITY): Payer: Medicare Other

## 2019-05-12 DIAGNOSIS — Z79899 Other long term (current) drug therapy: Secondary | ICD-10-CM | POA: Diagnosis not present

## 2019-05-12 DIAGNOSIS — I10 Essential (primary) hypertension: Secondary | ICD-10-CM | POA: Insufficient documentation

## 2019-05-12 DIAGNOSIS — Z7901 Long term (current) use of anticoagulants: Secondary | ICD-10-CM | POA: Diagnosis not present

## 2019-05-12 DIAGNOSIS — N390 Urinary tract infection, site not specified: Secondary | ICD-10-CM | POA: Diagnosis not present

## 2019-05-12 DIAGNOSIS — Z9350 Unspecified cystostomy status: Secondary | ICD-10-CM | POA: Diagnosis not present

## 2019-05-12 DIAGNOSIS — R1030 Lower abdominal pain, unspecified: Secondary | ICD-10-CM | POA: Diagnosis present

## 2019-05-12 LAB — CBC WITH DIFFERENTIAL/PLATELET
Abs Immature Granulocytes: 0.02 10*3/uL (ref 0.00–0.07)
Basophils Absolute: 0 10*3/uL (ref 0.0–0.1)
Basophils Relative: 1 %
Eosinophils Absolute: 0.3 10*3/uL (ref 0.0–0.5)
Eosinophils Relative: 6 %
HCT: 36.9 % (ref 36.0–46.0)
Hemoglobin: 11.8 g/dL — ABNORMAL LOW (ref 12.0–15.0)
Immature Granulocytes: 0 %
Lymphocytes Relative: 25 %
Lymphs Abs: 1.5 10*3/uL (ref 0.7–4.0)
MCH: 30.7 pg (ref 26.0–34.0)
MCHC: 32 g/dL (ref 30.0–36.0)
MCV: 96.1 fL (ref 80.0–100.0)
Monocytes Absolute: 0.6 10*3/uL (ref 0.1–1.0)
Monocytes Relative: 10 %
Neutro Abs: 3.5 10*3/uL (ref 1.7–7.7)
Neutrophils Relative %: 58 %
Platelets: 209 10*3/uL (ref 150–400)
RBC: 3.84 MIL/uL — ABNORMAL LOW (ref 3.87–5.11)
RDW: 13.5 % (ref 11.5–15.5)
WBC: 5.9 10*3/uL (ref 4.0–10.5)
nRBC: 0 % (ref 0.0–0.2)

## 2019-05-12 LAB — URINALYSIS, ROUTINE W REFLEX MICROSCOPIC
Bilirubin Urine: NEGATIVE
Glucose, UA: NEGATIVE mg/dL
Ketones, ur: NEGATIVE mg/dL
Nitrite: NEGATIVE
Protein, ur: 30 mg/dL — AB
RBC / HPF: >50 RBC/hpf — ABNORMAL HIGH (ref 0–5)
Specific Gravity, Urine: 1.008 (ref 1.005–1.030)
pH: 9 — ABNORMAL HIGH (ref 5.0–8.0)

## 2019-05-12 LAB — COMPREHENSIVE METABOLIC PANEL
ALT: 11 U/L (ref 0–44)
AST: 14 U/L — ABNORMAL LOW (ref 15–41)
Albumin: 3.7 g/dL (ref 3.5–5.0)
Alkaline Phosphatase: 67 U/L (ref 38–126)
Anion gap: 8 (ref 5–15)
BUN: 11 mg/dL (ref 6–20)
CO2: 23 mmol/L (ref 22–32)
Calcium: 8.8 mg/dL — ABNORMAL LOW (ref 8.9–10.3)
Chloride: 108 mmol/L (ref 98–111)
Creatinine, Ser: 0.63 mg/dL (ref 0.44–1.00)
GFR calc Af Amer: >60 mL/min (ref >60)
GFR calc non Af Amer: >60 mL/min (ref >60)
Glucose, Bld: 94 mg/dL (ref 70–99)
Potassium: 4.2 mmol/L (ref 3.5–5.1)
Sodium: 139 mmol/L (ref 135–145)
Total Bilirubin: 0.3 mg/dL (ref 0.3–1.2)
Total Protein: 6.8 g/dL (ref 6.5–8.1)

## 2019-05-12 MED ORDER — FENTANYL CITRATE (PF) 100 MCG/2ML IJ SOLN
50.0000 ug | Freq: Once | INTRAMUSCULAR | Status: AC
  Administered 2019-05-12: 50 ug via INTRAVENOUS
  Filled 2019-05-12: qty 2

## 2019-05-12 MED ORDER — FOSFOMYCIN TROMETHAMINE 3 G PO PACK: 3.0000 g | PACK | Freq: Once | ORAL | 0 refills | Status: AC

## 2019-05-12 MED ORDER — FOSFOMYCIN TROMETHAMINE 3 G PO PACK
3.0000 g | PACK | Freq: Once | ORAL | Status: AC
  Administered 2019-05-12: 3 g via ORAL
  Filled 2019-05-12 (×2): qty 3

## 2019-05-12 MED ORDER — IOHEXOL 300 MG/ML  SOLN: 30.0000 mL | Freq: Once | INTRAMUSCULAR | Status: DC

## 2019-05-12 MED ORDER — FENTANYL CITRATE (PF) 100 MCG/2ML IJ SOLN
25.0000 ug | Freq: Once | INTRAMUSCULAR | Status: AC
  Administered 2019-05-12: 25 ug via INTRAVENOUS
  Filled 2019-05-12: qty 2

## 2019-05-12 NOTE — Discharge Instructions (Signed)
It was my pleasure taking care of you today!   Take another dose of your antibiotic (fosfomycin) and 2 days (Friday).  Call your urologist today or first thing in the morning to schedule a follow-up appointment.  Return to the emergency department for new or worsening symptoms, any additional concerns.

## 2019-05-12 NOTE — ED Notes (Signed)
Bed: [facility] Expected date:  Expected time:  Means of arrival:  Comments: EMS/abd. pain 

## 2019-05-12 NOTE — ED Provider Notes (Signed)
[facility] DEPT Provider Note   CSN: [id number] Arrival date & time: 05/12/19  1024    History   Chief Complaint Chief Complaint  Patient presents with   Abdominal Pain    HPI Nerissa Constantin is a 43 y.o. female.     The history is provided by the patient and medical records. No language interpreter was used.  Abdominal Pain   Rye Dorado is a 43 y.o. female  with a PMH as listed below including CP, chronic suprapubic catheter followed by [facility] urology who presents to the Emergency Department complaining of suprapubic abdominal pain just above catheter. Pain 10/10, non-radiating and constant for 2 days. Made an appointment with her urologist for yesterday, but there was an issue with the address given for transportation and she missed her appointment. She reports being treated for UTI's several times in the past two months and believes that she finished her most recent ABX about a week ago. Denies n/v, changes in bowel habitus, blood in stool, fevers. No new back pain. Catheter draining fine.   Past Medical History:  Diagnosis Date   Acute respiratory failure with hypoxia ([name])    Anemia    Cellulitis 12/22/2014   Cerebral palsy ([name])    Spastic Cerebral palsy, mentally intact   Contracture, joint, multiple sites    Electric wheelchair, uses left hand to operate chair.    Depression    [name]    Dysarthria    GERD (gastroesophageal reflux disease)    Gram positive sepsis ([place]) 10/24/2014   HCAP (healthcare-associated pneumonia)    2015, 2018   History of endometriosis 10/2012   OBGYN [facility]: Laparoscopic Endometrial Ablation, TVH,   History of recurrent UTIs    Hypertension    Incontinent of feces 03/11/2016   Intracervical pessary 05/07/2011   Overview:  Overview:  Placed by [facility] 04/30/11 to treat DUB and Endometriosis.  She is seen at GYN clinic at [facility] but was considered a poor  surgical candidate and referred her to [facility].  For DUB she had pelvic ultrasound that showed thin stripe and she had endometrial Bx by Dr Nori Riis in [place] clinic, which was negative.      Macroscopic hematuria 03/10/2013   status post replacement of suprapubic tube 03/04/2013    Migraine    OCD (obsessive compulsive disorder)    Parapneumonic effusion 10/25/2014   Presence of intrathecal baclofen pump 03/07/2014   R LQ. Insertion T10.     Psychiatric illness 10/31/2014   Recurrent pulmonary embolism ([name])    Pt has history of recurrent PE and is on lifelong coumadin.    Seizures ([place]) 02/12/2016   Spasticity 01/05/2014   Transient alteration of awareness, recurrent 09/08/2006   Recurrent episodes where Lorri  loses contact with her world and that have been studied thoroughly and appear nonepileptic in nature per Dr Krista Blue (neurology) The patient has had episodes starting in 2007 that initially began 1-4 times a day during which time she would have periods of unawareness followed by confusion. This continuous video EEG monitoring performed from October 8-12, 2007. Had    Urethra dilated and patulous 07/03/2015   Patulous, Dilated Urethra. 63m balloon on a 22-Fr catheter hoping this would prevent the bladder spasm from pushing the balloon down the urethra (Dr EAmalia [facility], July 2016)     Patient Active Problem List   Diagnosis Date Noted   Pressure injury of buttock, stage 2 ([place] 04/20/2019  Palliative care encounter    Cough 01/04/2019   Bad taste in mouth 01/04/2019   Hypotension 11/19/2018   Urinary tract infection associated with indwelling urethral catheter ([place]) 11/18/2018   Solitary pulmonary nodule on lung CT 10/28/2018   Connective tissue disorder ([place]) 08/07/2018   Migraine without aura and without status migrainosus, not intractable 06/26/2018   UTI (urinary tract infection) 05/25/2018   Headache 05/25/2018   Incontinent of feces 03/11/2016   Chronic  anticoagulation 03/11/2016   Involuntary movements 02/22/2016   Seizures ([place]) 02/12/2016   Dyspnea 02/12/2016   Cerebral palsy, quadriplegic ([place]) 02/06/2016   Swelling of extremity 01/13/2016   Nausea without vomiting 11/09/2015   Fever    Pressure ulcer 10/09/2015   Status post insertion of inferior vena caval filter 09/28/2015   Absence of bladder continence 08/25/2015   Person living in residential institution 06/14/2015   Pulmonary hypertension ([place])    Anemia of chronic disease    Spasticity 03/15/2015   Constipation, chronic 10/10/2014   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   Dystonia 04/12/2014   UTI (lower urinary tract infection) 03/16/2014   Presence of intrathecal baclofen pump 03/07/2014   Dental caries 07/18/2013   Multiple thyroid nodules 05/20/2013   Osteoarthrosis 04/07/2013   [name]    Dysarthria    GERD (gastroesophageal reflux disease)    Neurogenic bladder 02/05/2013   Anxiety state 10/21/2012   Chronic pain syndrome 06/17/2012   Recurrent pulmonary embolism ([place]) 05/03/2011   Pulmonary embolism with infarction ([place]) 05/03/2011   Chronic suprapubic catheter ([place]) 03/15/2011   Disease of female genital organs 10/24/2010   HYPERTENSION, BENIGN 01/31/2010   CP (cerebral palsy), spastic ([place]) 12/27/2008   Contracted, joint, multiple sites 12/27/2008   Major depressive disorder, recurrent episode ([place]) 01/29/2007   Obsessive Compulsive Disorder 01/29/2007   Transient alteration of awareness, recurrent 09/08/2006    Past Surgical History:  Procedure Laterality Date   ABDOMINAL HYSTERECTOMY     APPENDECTOMY     BACLOFEN PUMP REFILL     x 3 times   CARPAL TUNNEL RELEASE  08/2008   Dr Daylene Katayama   CESAREAN SECTION     x 2   CHOLECYSTECTOMY  10/14/2015   Procedure: LAPAROSCOPIC CHOLECYSTECTOMY;  Surgeon: Georganna Skeans, MD;  Location: [facility];  Service: General;;   COLPOSCOPY  06/2000     INTRAUTERINE DEVICE INSERTION  04/30/11   Inserted by [place] [facility] for endometriosis   LAPAROSCOPIC ASSISTED VAGINAL HYSTERECTOMY  10/27/2012   MULTIPLE EXTRACTIONS WITH ALVEOLOPLASTY Bilateral 07/19/2013   Procedure: EXTRACTIONS #4, 8,24,23,53;  Surgeon: Isac Caddy, DDS;  Location: [facility];  Service: Oral Surgery;  Laterality: Bilateral;   NEUROMA SURGERY Left    anterior and posterior intersosseous   PAIN PUMP IMPLANTATION N/A 03/07/2014   Procedure: baclofen pump revision/replacement and Catheter connection replacement;  Surgeon: Ophelia Charter, MD;  Location: [facility];  Service: Neurosurgery;  Laterality: N/A;  baclofen pump revision/replacement and Catheter connection replacement   PROGRAMABLE BACLOFEN PUMP REVISION  03/17/14   Battery Replacement    TUBAL LIGATION  2003   URETHRA SURGERY     WRIST SURGERY  06/2010   Dr Nicoletta Dress, hand surgeon, [name]     OB History   No obstetric history on file.      Home Medications    Prior to Admission medications   Medication Sig Start Date End Date Taking? Authorizing Provider  acetaminophen (TYLENOL 8 HOUR) 650 MG CR tablet Take 1  tablet (650 mg total) by mouth every 8 (eight) hours as needed. Patient taking differently: Take 1,300 mg by mouth 2 (two) times daily as needed for pain.  07/08/18   Varney Biles, MD  amantadine (SYMMETREL) 100 MG capsule Take 100 mg by mouth daily.     [provider]  azelastine (ASTELIN) 0.1 % nasal spray Place 2 sprays into both nostrils 2 (two) times daily. Use in each nostril as directed    [provider]  baclofen (LIORESAL) 20 MG tablet Take 1 tablet (20 mg total) by mouth 4 (four) times daily. For bladder spasms 12/28/18   Rockwell Germany, NP  BELBUCA 750 MCG FILM  04/05/19   [provider]  busPIRone (BUSPAR) 5 MG tablet Take 5 mg by mouth 3 (three) times daily.    [provider]  cephALEXin (KEFLEX) 500 MG capsule Take 1 capsule (500 mg  total) by mouth 2 (two) times daily. 04/19/19   Dorie Rank, MD  clonazePAM (KLONOPIN) 0.5 MG tablet Take 1.5 tablets (0.75 mg total) by mouth 3 (three) times daily. 11/27/17   Mariel Aloe, MD  feeding supplement, ENSURE ENLIVE, (ENSURE ENLIVE) LIQD Take 237 mLs by mouth 2 (two) times daily between meals. 05/30/18   Kayleen Memos, DO  FLUoxetine (PROZAC) 40 MG capsule Take 80 mg by mouth daily.    [provider]  fosfomycin (MONUROL) 3 g PACK Take 3 g by mouth once for 1 dose. On 6/12 05/14/19 05/14/19  Spero Gunnels, Ozella Almond, PA-C  guaiFENesin (MUCINEX) 600 MG 12 hr tablet Take 600 mg by mouth 2 (two) times daily as needed for cough or to loosen phlegm.     [provider]  hydroxychloroquine (PLAQUENIL) 200 MG tablet Take 200 mg by mouth daily.    [provider]  lamoTRIgine (LAMICTAL) 100 MG tablet Take 100 mg by mouth at bedtime.    [provider]  LINZESS 290 MCG CAPS capsule Take 290 mcg by mouth every morning. 02/24/19   [provider]  Melatonin 5 MG TABS Take 5 mg by mouth at bedtime.     [provider]  MOVANTIK 25 MG TABS tablet Take 25 mg by mouth daily. 04/05/19   [provider]  NARCAN 4 MG/0.1ML LIQD nasal spray kit Place 4 mg into the nose See admin instructions. 02/24/19   [provider]  omeprazole (PRILOSEC) 20 MG capsule Take 1 capsule (20 mg total) by mouth daily. 01/04/19   Horald Pollen, MD  ondansetron (ZOFRAN) 4 MG tablet Take 1 tablet (4 mg total) by mouth every 6 (six) hours as needed for nausea. 11/19/18   Georgette Shell, MD  oxybutynin (DITROPAN) 5 MG tablet Take 5 mg by mouth 4 (four) times daily.  02/20/16   [provider]  Oxycodone HCl 10 MG TABS Take 10 mg by mouth every 6 (six) hours as needed (pain).    [provider]  polyethylene glycol powder (GLYCOLAX/MIRALAX) powder Take 1  1/2 dose  daily in 12 ounces of fluid every day. Patient taking differently: Take  25.5 g by mouth daily.  01/01/17   Esterwood, [name] S, PA-C  QUEtiapine (SEROQUEL) 50 MG tablet Take 150 mg by mouth at bedtime.  01/08/19   [provider]  rivaroxaban (XARELTO) 10 MG TABS tablet Take 10 mg by mouth every evening.     [provider]  Skin Protectants, Misc. (EUCERIN) cream Apply topically as needed for wound care. Patient taking  differently: Apply 1 application topically daily as needed for wound care.  02/05/19   Albrizze, Kaitlyn E, PA-C  tiZANidine (ZANAFLEX) 4 MG capsule Take 1 capsule (4 mg total) by mouth 2 (two) times daily. 12/28/18   Rockwell Germany, NP  topiramate (TOPAMAX) 100 MG tablet Take 1 tablet at bedtime Patient taking differently: Take 100 mg by mouth at bedtime.  02/10/19   Rockwell Germany, NP  venlafaxine XR (EFFEXOR-XR) 37.5 MG 24 hr capsule Take 37.5 mg by mouth daily. 01/08/19   [provider]    Family History Family History  Problem Relation Age of Onset   Asthma Father    Colon cancer Maternal Grandmother        Died in her 33's   Breast cancer Paternal Grandmother        Died in her 42's   Heart attack Maternal Grandfather        Died in his 34's   Alzheimer's disease Paternal Grandfather        Died in his 89's   Breast cancer Mother    Stomach cancer Neg Hx     Social History Social History   Tobacco Use   Smoking status: Never Smoker   Smokeless tobacco: Never Used  Substance Use Topics   Alcohol use: Yes    Alcohol/week: 1.0 standard drinks    Types: 1 Glasses of wine per week    Comment: Drinks alcohol once a month.   Drug use: No     Allergies   Morphine   Review of Systems Review of Systems  Gastrointestinal: Positive for abdominal pain.  All other systems reviewed and are negative.    Physical Exam Updated Vital Signs BP 101/62 (BP Location: Left Arm)    Pulse 78    Temp 98.1 F (36.7 C) (Oral)    Resp 18    LMP 04/05/2011    SpO2 99%   Physical Exam Vitals signs and  nursing note reviewed.  Constitutional:      General: She is not in acute distress.    Appearance: She is well-developed.     Comments: Nontoxic-appearing.  HENT:     Head: Normocephalic and atraumatic.  Neck:     Musculoskeletal: Neck supple.  Cardiovascular:     Rate and Rhythm: Normal rate and regular rhythm.     Heart sounds: Normal heart sounds. No murmur.  Pulmonary:     Effort: Pulmonary effort is normal. No respiratory distress.     Breath sounds: Normal breath sounds.  Abdominal:     General: There is no distension.     Palpations: Abdomen is soft.     Comments: Suprapubic catheter in place.  No erythema around the site.  Draining well with yellow urine in the bag.  Tenderness to palpation just above suprapubic cath.  No rebound tenderness.   Skin:    General: Skin is warm and dry.  Neurological:     Mental Status: She is alert and oriented to person, place, and time.      ED Treatments / Results  Labs (all labs ordered are listed, but only abnormal results are displayed) Labs Reviewed  CBC WITH DIFFERENTIAL/PLATELET - Abnormal; Notable for the following components:      Result Value   RBC 3.84 (*)    Hemoglobin 11.8 (*)    All other components within normal limits  COMPREHENSIVE METABOLIC PANEL - Abnormal; Notable for the following components:   Calcium 8.8 (*)    AST 14 (*)  All other components within normal limits  URINALYSIS, ROUTINE W REFLEX MICROSCOPIC - Abnormal; Notable for the following components:   APPearance CLOUDY (*)    pH 9.0 (*)    Hgb urine dipstick MODERATE (*)    Protein, ur 30 (*)    Leukocytes,Ua LARGE (*)    RBC / HPF >50 (*)    Bacteria, UA FEW (*)    All other components within normal limits  URINE CULTURE    EKG None  Radiology No results found.  Procedures Procedures (including critical care time)  Medications Ordered in ED Medications  fosfomycin (MONUROL) packet 3 g (has no administration in time range)  fentaNYL  (SUBLIMAZE) injection 25 mcg (25 mcg Intravenous Given 05/12/19 1125)  fentaNYL (SUBLIMAZE) injection 50 mcg (50 mcg Intravenous Given 05/12/19 1547)     Initial Impression / Assessment and Plan / ED Course  I have reviewed the triage vital signs and the nursing notes.  Pertinent labs & imaging results that were available during my care of the patient were reviewed by me and considered in my medical decision making (see chart for details).       Lakaya Tolen is a 43 y.o. female history of chronic suprapubic catheter, cerebral palsy who presents to ED for suprapubic abdominal pain which feels similar to her urinary tract infections.  She states that she has been on antibiotics up until last week.  Per chart review, it does appear that she was seen in the ED on May 18 where she was given dose of fosfomycin in the emergency department and sent home on 1 weeks worth of Keflex.  Her follow-up with urology as well as family medicine per epic do not mention any further antibiotic use.  On exam today, she is afebrile, hemodynamically stable with tenderness to her abdomen just above suprapubic catheter.  Labs reviewed and reassuring including normal white count. UA with large leuks, 21-50 WBCs.  Sent for urine culture.  Discussed urinalysis with attending, Dr. Zenia Resides who recommends treatment for UTI.  Discussed case with pharmacy staff who has reviewed previous culture data and recommends 1 dose of fosfomycin in the emergency department today as well as a second dose and 48 hours.  CT renal study reviewed showing new mild to moderate left hydro-without obstructing stone.  The tip of her suprapubic Foley catheter terminates right around her left UVJ which potentially could be causing some obstruction.  Chronic bowel changes also noted.  I discussed her CT findings with urology, Dr. Alinda Money, who recommends deflating balloon of her catheter about 5 cc to relieve pressure which was done.  She was encouraged to  follow-up with her urologist and I recommended she call today or tomorrow morning to schedule follow-up appointment.  Reasons to emergency department were discussed and all questions answered.  Patient discussed with Dr. Zenia Resides who agrees with treatment plan.    Final Clinical Impressions(s) / ED Diagnoses   Final diagnoses:  Lower urinary tract infectious disease    ED Discharge Orders         Ordered    fosfomycin (MONUROL) 3 g PACK   Once     05/12/19 1601           Kemiya Batdorf, Ozella Almond, PA-C 05/12/19 1610    Lacretia Leigh, MD 05/13/19 [phone]

## 2019-05-12 NOTE — ED Triage Notes (Signed)
Per [facility]: PT is coming from home with reports abdominal pain reported at 10/10.  Pt has a hx of cerebral palsy.  Pt reportedly just got over a UTI approximately 1 week ago and has a suprapubic catheter.

## 2019-05-13 ENCOUNTER — Other Ambulatory Visit: Payer: Self-pay

## 2019-05-13 DIAGNOSIS — Z978 Presence of other specified devices: Secondary | ICD-10-CM

## 2019-05-13 DIAGNOSIS — G808 Other cerebral palsy: Secondary | ICD-10-CM

## 2019-05-13 DIAGNOSIS — T83511S Infection and inflammatory reaction due to indwelling urethral catheter, sequela: Secondary | ICD-10-CM

## 2019-05-13 DIAGNOSIS — N390 Urinary tract infection, site not specified: Secondary | ICD-10-CM

## 2019-05-13 DIAGNOSIS — L89302 Pressure ulcer of unspecified buttock, stage 2: Secondary | ICD-10-CM

## 2019-05-15 LAB — URINE CULTURE: Culture: >=100000 — AB

## 2019-05-16 ENCOUNTER — Telehealth: Payer: Self-pay | Admitting: Emergency Medicine

## 2019-05-16 ENCOUNTER — Emergency Department (HOSPITAL_COMMUNITY): Payer: Medicare Other

## 2019-05-16 ENCOUNTER — Encounter (HOSPITAL_COMMUNITY): Payer: Self-pay | Admitting: Emergency Medicine

## 2019-05-16 ENCOUNTER — Emergency Department (HOSPITAL_COMMUNITY)
Admission: EM | Admit: 2019-05-16 | Discharge: 2019-05-16 | Disposition: A | Discharge status: Home / Self Care | Payer: Medicare Other | Attending: Emergency Medicine | Admitting: Emergency Medicine

## 2019-05-16 ENCOUNTER — Other Ambulatory Visit: Payer: Self-pay

## 2019-05-16 DIAGNOSIS — Z79899 Other long term (current) drug therapy: Secondary | ICD-10-CM | POA: Insufficient documentation

## 2019-05-16 DIAGNOSIS — N390 Urinary tract infection, site not specified: Secondary | ICD-10-CM | POA: Insufficient documentation

## 2019-05-16 DIAGNOSIS — R1030 Lower abdominal pain, unspecified: Secondary | ICD-10-CM | POA: Insufficient documentation

## 2019-05-16 DIAGNOSIS — Z960 Presence of urogenital implants: Secondary | ICD-10-CM | POA: Insufficient documentation

## 2019-05-16 DIAGNOSIS — G803 Athetoid cerebral palsy: Secondary | ICD-10-CM | POA: Diagnosis not present

## 2019-05-16 DIAGNOSIS — K5900 Constipation, unspecified: Secondary | ICD-10-CM

## 2019-05-16 DIAGNOSIS — I10 Essential (primary) hypertension: Secondary | ICD-10-CM | POA: Diagnosis not present

## 2019-05-16 DIAGNOSIS — Z7901 Long term (current) use of anticoagulants: Secondary | ICD-10-CM | POA: Insufficient documentation

## 2019-05-16 LAB — COMPREHENSIVE METABOLIC PANEL
ALT: 12 U/L (ref 0–44)
AST: 12 U/L — ABNORMAL LOW (ref 15–41)
Albumin: 3.4 g/dL — ABNORMAL LOW (ref 3.5–5.0)
Alkaline Phosphatase: 78 U/L (ref 38–126)
Anion gap: 7 (ref 5–15)
BUN: 12 mg/dL (ref 6–20)
CO2: 19 mmol/L — ABNORMAL LOW (ref 22–32)
Calcium: 8.6 mg/dL — ABNORMAL LOW (ref 8.9–10.3)
Chloride: 108 mmol/L (ref 98–111)
Creatinine, Ser: 0.72 mg/dL (ref 0.44–1.00)
GFR calc Af Amer: >60 mL/min (ref >60)
GFR calc non Af Amer: >60 mL/min (ref >60)
Glucose, Bld: 144 mg/dL — ABNORMAL HIGH (ref 70–99)
Potassium: 4.1 mmol/L (ref 3.5–5.1)
Sodium: 134 mmol/L — ABNORMAL LOW (ref 135–145)
Total Bilirubin: 0.6 mg/dL (ref 0.3–1.2)
Total Protein: 6.1 g/dL — ABNORMAL LOW (ref 6.5–8.1)

## 2019-05-16 LAB — CBC WITH DIFFERENTIAL/PLATELET
Abs Immature Granulocytes: 0.03 10*3/uL (ref 0.00–0.07)
Basophils Absolute: 0 10*3/uL (ref 0.0–0.1)
Basophils Relative: 0 %
Eosinophils Absolute: 0.4 10*3/uL (ref 0.0–0.5)
Eosinophils Relative: 6 %
HCT: 36.5 % (ref 36.0–46.0)
Hemoglobin: 11.6 g/dL — ABNORMAL LOW (ref 12.0–15.0)
Immature Granulocytes: 0 %
Lymphocytes Relative: 25 %
Lymphs Abs: 1.7 10*3/uL (ref 0.7–4.0)
MCH: 30.4 pg (ref 26.0–34.0)
MCHC: 31.8 g/dL (ref 30.0–36.0)
MCV: 95.8 fL (ref 80.0–100.0)
Monocytes Absolute: 0.6 10*3/uL (ref 0.1–1.0)
Monocytes Relative: 10 %
Neutro Abs: 3.9 10*3/uL (ref 1.7–7.7)
Neutrophils Relative %: 59 %
Platelets: 211 10*3/uL (ref 150–400)
RBC: 3.81 MIL/uL — ABNORMAL LOW (ref 3.87–5.11)
RDW: 13.5 % (ref 11.5–15.5)
WBC: 6.7 10*3/uL (ref 4.0–10.5)
nRBC: 0 % (ref 0.0–0.2)

## 2019-05-16 LAB — URINALYSIS, ROUTINE W REFLEX MICROSCOPIC
Bilirubin Urine: NEGATIVE
Glucose, UA: NEGATIVE mg/dL
Ketones, ur: NEGATIVE mg/dL
Nitrite: POSITIVE — AB
Protein, ur: 100 mg/dL — AB
RBC / HPF: >50 RBC/hpf — ABNORMAL HIGH (ref 0–5)
Specific Gravity, Urine: 1.019 (ref 1.005–1.030)
WBC, UA: >50 WBC/hpf — ABNORMAL HIGH (ref 0–5)
pH: 5 (ref 5.0–8.0)

## 2019-05-16 LAB — LIPASE, BLOOD: Lipase: 23 U/L (ref 11–51)

## 2019-05-16 LAB — LACTIC ACID, PLASMA: Lactic Acid, Venous: 1.3 mmol/L (ref 0.5–1.9)

## 2019-05-16 MED ORDER — IOHEXOL 300 MG/ML  SOLN
100.0000 mL | Freq: Once | INTRAMUSCULAR | Status: AC | PRN
  Administered 2019-05-16: 100 mL via INTRAVENOUS

## 2019-05-16 MED ORDER — FOSFOMYCIN TROMETHAMINE 3 G PO PACK
3.0000 g | PACK | Freq: Once | ORAL | Status: AC
  Administered 2019-05-16: 19:00:00 3 g via ORAL
  Filled 2019-05-16 (×2): qty 3

## 2019-05-16 MED ORDER — MAGNESIUM CITRATE PO SOLN: 148.0000 mL | Freq: Once | ORAL | 0 refills | Status: AC

## 2019-05-16 MED ORDER — SODIUM CHLORIDE 0.9 % IV BOLUS
1000.0000 mL | Freq: Once | INTRAVENOUS | Status: AC
  Administered 2019-05-16: 1000 mL via INTRAVENOUS

## 2019-05-16 MED ORDER — FENTANYL CITRATE (PF) 100 MCG/2ML IJ SOLN
50.0000 ug | Freq: Once | INTRAMUSCULAR | Status: AC
  Administered 2019-05-16: 50 ug via INTRAVENOUS
  Filled 2019-05-16: qty 2

## 2019-05-16 NOTE — ED Notes (Signed)
Patient verbalizes understanding of discharge instructions. Opportunity for questioning and answers were provided. Armband removed by staff, pt discharged from ED by [facility]   

## 2019-05-16 NOTE — ED Provider Notes (Signed)
Patient taken in sign out from [facility].  She has a history of cerebral palsy.  Here with urinary tract infection.  It has multiple resistances she is unable to afford outpatient fosfomycin dose.  Her CT scan has returned and shows that she has an excessive amount of stool in her colon.  I reviewed the findings with the patient and her father was at bedside.  She will be discharged with fosfomycin to take at home.  She has been given a dose here.  She has a follow-up appointment with her urologist at [facility] in 2 days.  She is afebrile and hemodynamically stable.  She will be discharged with a prescription exam citrate.  Discussed return precautions   Margarita Mail, PA-C 05/17/19 2253    Charlesetta Shanks, MD 05/22/19 [phone]

## 2019-05-16 NOTE — ED Notes (Signed)
Pt is requesting her dad

## 2019-05-16 NOTE — ED Notes (Signed)
Left two voice mails for case managment

## 2019-05-16 NOTE — ED Notes (Addendum)
Pt asking for med before being transported to CT

## 2019-05-16 NOTE — ED Notes (Signed)
Bladder Scanner showed 55ml in bladder.

## 2019-05-16 NOTE — ED Provider Notes (Signed)
[place] EMERGENCY DEPARTMENT Provider Note   CSN: [id number] Arrival date & time: 05/16/19  1403    History   Chief Complaint Chief Complaint  Patient presents with   Urinary Tract Infection    HPI [name] [name] is a 43 y.o. female with history of cerebral palsy, recurrent UTI, suprapubic catheter followed by [facility] urology Dr Amalia Hailey presents to the ER for evaluation of persistent, worsening, 10/10, constant lower abdominal pain that has been worsening for the last 4 days.  Associated with feeling more tired and sleeping more. No interventions. No alleviating factors. Denies back pain, nausea, vomiting. Level 5 caveat due to underlying cerebral palsy, difficult understanding speech.   1515: I spoke to patient's father on the phone to obtain more history. States she was seen in the ER on 6/10 for suprapubic abdominal pain above her catheter.  She was told she had a UTI and she was prescribed one-time dose of fosfomycin to take on Friday but was unable to refill it because Medicare did not cover it.  She has an appointment with urology on Tuesday.  States urine is always cloudy. Patient lives with aide and father doesn't know of any acute changes otherwise.     HPI  Past Medical History:  Diagnosis Date   Acute respiratory failure with hypoxia ([place])    Anemia    Cellulitis 12/22/2014   Cerebral palsy ([name])    Spastic Cerebral palsy, mentally intact   Contracture, joint, multiple sites    Electric wheelchair, uses left hand to operate chair.    Depression    [name]    Dysarthria    GERD (gastroesophageal reflux disease)    Gram positive sepsis ([place]) 10/24/2014   HCAP (healthcare-associated pneumonia)    2015, 2018   History of endometriosis 10/2012   OBGYN [facility]: Laparoscopic Endometrial Ablation, TVH,   History of recurrent UTIs    Hypertension    Incontinent of feces 03/11/2016   Intracervical pessary 05/07/2011   Overview:  Overview:  Placed by [place] 04/30/11 to treat DUB and Endometriosis.  She is seen at GYN clinic at [facility] but was considered a poor surgical candidate and referred her to [facility].  For DUB she had pelvic ultrasound that showed thin stripe and she had endometrial Bx by Dr Nori Riis in [place] clinic, which was negative.      Macroscopic hematuria 03/10/2013   status post replacement of suprapubic tube 03/04/2013    Migraine    OCD (obsessive compulsive disorder)    Parapneumonic effusion 10/25/2014   Presence of intrathecal baclofen pump 03/07/2014   R LQ. Insertion T10.     Psychiatric illness 10/31/2014   Recurrent pulmonary embolism ([name])    Pt has history of recurrent PE and is on lifelong coumadin.    Seizures ([place]) 02/12/2016   Spasticity 01/05/2014   Transient alteration of awareness, recurrent 09/08/2006   Recurrent episodes where Alisen  loses contact with her world and that have been studied thoroughly and appear nonepileptic in nature per Dr Krista Blue (neurology) The patient has had episodes starting in 2007 that initially began 1-4 times a day during which time she would have periods of unawareness followed by confusion. This continuous video EEG monitoring performed from October 8-12, 2007. Had    Urethra dilated and patulous 07/03/2015   Patulous, Dilated Urethra. 67m balloon on a 22-Fr catheter hoping this would prevent the bladder spasm from pushing the balloon down the urethra (Dr EAmalia Hailey  Urology [facility], July 2016)     Patient Active Problem List   Diagnosis Date Noted   Pressure injury of buttock, stage 2 ([place]) 04/20/2019   Palliative care encounter    Cough 01/04/2019   Bad taste in mouth 01/04/2019   Hypotension 11/19/2018   Urinary tract infection associated with indwelling urethral catheter ([place]) 11/18/2018   Solitary pulmonary nodule on lung CT 10/28/2018   Connective tissue disorder ([place]) 08/07/2018   Migraine without aura and without status  migrainosus, not intractable 06/26/2018   UTI (urinary tract infection) 05/25/2018   Headache 05/25/2018   Incontinent of feces 03/11/2016   Chronic anticoagulation 03/11/2016   Involuntary movements 02/22/2016   Seizures ([place]) 02/12/2016   Dyspnea 02/12/2016   Cerebral palsy, quadriplegic ([place]) 02/06/2016   Swelling of extremity 01/13/2016   Nausea without vomiting 11/09/2015   Fever    Pressure ulcer 10/09/2015   Status post insertion of inferior vena caval filter 09/28/2015   Absence of bladder continence 08/25/2015   Person living in residential institution 06/14/2015   Pulmonary hypertension ([place])    Anemia of chronic disease    Spasticity 03/15/2015   Constipation, chronic 10/10/2014   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   Dystonia 04/12/2014   UTI (lower urinary tract infection) 03/16/2014   Presence of intrathecal baclofen pump 03/07/2014   Dental caries 07/18/2013   Multiple thyroid nodules 05/20/2013   Osteoarthrosis 04/07/2013   [name]    Dysarthria    GERD (gastroesophageal reflux disease)    Neurogenic bladder 02/05/2013   Anxiety state 10/21/2012   Chronic pain syndrome 06/17/2012   Recurrent pulmonary embolism ([place]) 05/03/2011   Pulmonary embolism with infarction ([place]) 05/03/2011   Chronic suprapubic catheter ([place]) 03/15/2011   Disease of female genital organs 10/24/2010   HYPERTENSION, BENIGN 01/31/2010   CP (cerebral palsy), spastic ([place]) 12/27/2008   Contracted, joint, multiple sites 12/27/2008   Major depressive disorder, recurrent episode ([place]) 01/29/2007   Obsessive Compulsive Disorder 01/29/2007   Transient alteration of awareness, recurrent 09/08/2006    Past Surgical History:  Procedure Laterality Date   ABDOMINAL HYSTERECTOMY     APPENDECTOMY     BACLOFEN PUMP REFILL     x 3 times   CARPAL TUNNEL RELEASE  08/2008   Dr Daylene Katayama   CESAREAN SECTION     x 2    CHOLECYSTECTOMY  10/14/2015   Procedure: LAPAROSCOPIC CHOLECYSTECTOMY;  Surgeon: Georganna Skeans, MD;  Location: [facility];  Service: General;;   COLPOSCOPY  06/2000   INTRAUTERINE DEVICE INSERTION  04/30/11   Inserted by [place] for endometriosis   LAPAROSCOPIC ASSISTED VAGINAL HYSTERECTOMY  10/27/2012   MULTIPLE EXTRACTIONS WITH ALVEOLOPLASTY Bilateral 07/19/2013   Procedure: EXTRACTIONS #4, 1,60,73,71;  Surgeon: Isac Caddy, DDS;  Location: [facility];  Service: Oral Surgery;  Laterality: Bilateral;   NEUROMA SURGERY Left    anterior and posterior intersosseous   PAIN PUMP IMPLANTATION N/A 03/07/2014   Procedure: baclofen pump revision/replacement and Catheter connection replacement;  Surgeon: Ophelia Charter, MD;  Location: [facility]UR[facility]S;  Service: Neurosurgery;  Laterality: N/A;  baclofen pump revision/replacement and Catheter connection replacement   PROGRAMABLE BACLOFEN PUMP REVISION  03/17/14   Battery Replacement    TUBAL LIGATION  2003   URETHRA SURGERY     WRIST SURGERY  06/2010   Dr Nicoletta Dress, hand surgeon, [name]     OB History   No obstetric history on file.      Home Medications  Prior to Admission medications   Medication Sig Start Date End Date Taking? Authorizing Provider  Buprenorphine HCl (BELBUCA) 900 MCG FILM Place 900 mcg inside cheek every 12 (twelve) hours.   Yes [provider]  acetaminophen (TYLENOL 8 HOUR) 650 MG CR tablet Take 1 tablet (650 mg total) by mouth every 8 (eight) hours as needed. Patient taking differently: Take 1,300 mg by mouth 2 (two) times daily as needed for pain.  07/08/18   Varney Biles, MD  amantadine (SYMMETREL) 100 MG capsule Take 100 mg by mouth daily.     [provider]  azelastine (ASTELIN) 0.1 % nasal spray Place 2 sprays into both nostrils 2 (two) times daily. Use in each nostril as directed    [provider]  baclofen (LIORESAL) 20 MG tablet Take 1 tablet (20 mg total) by mouth 4 (four)  times daily. For bladder spasms 12/28/18   Rockwell Germany, NP  BELBUCA 750 MCG FILM  04/05/19   [provider]  busPIRone (BUSPAR) 5 MG tablet Take 5 mg by mouth 3 (three) times daily.    [provider]  cephALEXin (KEFLEX) 500 MG capsule Take 1 capsule (500 mg total) by mouth 2 (two) times daily. 04/19/19   Dorie Rank, MD  clonazePAM (KLONOPIN) 0.5 MG tablet Take 1.5 tablets (0.75 mg total) by mouth 3 (three) times daily. 11/27/17   Mariel Aloe, MD  feeding supplement, ENSURE ENLIVE, (ENSURE ENLIVE) LIQD Take 237 mLs by mouth 2 (two) times daily between meals. 05/30/18   Kayleen Memos, DO  FLUoxetine (PROZAC) 40 MG capsule Take 80 mg by mouth daily.    [provider]  guaiFENesin (MUCINEX) 600 MG 12 hr tablet Take 600 mg by mouth 2 (two) times daily as needed for cough or to loosen phlegm.     [provider]  hydroxychloroquine (PLAQUENIL) 200 MG tablet Take 200 mg by mouth daily.    [provider]  lamoTRIgine (LAMICTAL) 100 MG tablet Take 100 mg by mouth at bedtime.    [provider]  linaclotide (LINZESS) 290 MCG CAPS capsule Take 290 mcg by mouth daily before breakfast.    [provider]  Melatonin 5 MG TABS Take 5 mg by mouth at bedtime.     [provider]  naloxegol oxalate (MOVANTIK) 25 MG TABS tablet Take 25 mg by mouth daily.    [provider]  NARCAN 4 MG/0.1ML LIQD nasal spray kit Place 4 mg into the nose See admin instructions. 02/24/19   [provider]  omeprazole (PRILOSEC) 20 MG capsule Take 1 capsule (20 mg total) by mouth daily. 01/04/19   Horald Pollen, MD  ondansetron (ZOFRAN) 4 MG tablet Take 1 tablet (4 mg total) by mouth every 6 (six) hours as needed for nausea. 11/19/18   Georgette Shell, MD  oxybutynin (DITROPAN) 5 MG tablet Take 5 mg by mouth 4 (four) times daily.  02/20/16   [provider]  Oxycodone HCl 10 MG TABS Take 10 mg by mouth every 6 (six)  hours as needed (pain).    [provider]  polyethylene glycol powder (GLYCOLAX/MIRALAX) powder Take 1  1/2 dose  daily in 12 ounces of fluid every day. Patient taking differently: Take 25.5 g by mouth daily.  01/01/17   Ester[place], [name] S, PA-C  QUEtiapine (SEROQUEL) 50 MG tablet Take 150 mg by mouth at bedtime.  01/08/19   [provider]  rivaroxaban (XARELTO) 10 MG TABS tablet Take  10 mg by mouth every evening.     [provider]  Skin Protectants, Misc. (EUCERIN) cream Apply topically as needed for wound care. Patient taking differently: Apply 1 application topically daily as needed for wound care.  02/05/19   Albrizze, Kaitlyn E, PA-C  tiZANidine (ZANAFLEX) 4 MG capsule Take 1 capsule (4 mg total) by mouth 2 (two) times daily. 12/28/18   Rockwell Germany, NP  topiramate (TOPAMAX) 100 MG tablet Take 1 tablet at bedtime Patient taking differently: Take 100 mg by mouth at bedtime.  02/10/19   Rockwell Germany, NP  venlafaxine XR (EFFEXOR-XR) 37.5 MG 24 hr capsule Take 37.5 mg by mouth daily. 01/08/19   [provider]    Family History Family History  Problem Relation Age of Onset   Asthma Father    Colon cancer Maternal Grandmother        Died in her 60's   Breast cancer Paternal Grandmother        Died in her 30's   Heart attack Maternal Grandfather        Died in his 40's   Alzheimer's disease Paternal Grandfather        Died in his 45's   Breast cancer Mother    Stomach cancer Neg Hx     Social History Social History   Tobacco Use   Smoking status: Never Smoker   Smokeless tobacco: Never Used  Substance Use Topics   Alcohol use: Yes    Alcohol/week: 1.0 standard drinks    Types: 1 Glasses of wine per week    Comment: Drinks alcohol once a month.   Drug use: No     Allergies   Morphine   Review of Systems Review of Systems  Constitutional: Positive for fatigue.  Gastrointestinal: Positive for abdominal pain.  All other  systems reviewed and are negative.    Physical Exam Updated Vital Signs BP 100/74    Pulse 74    Temp 100 F (37.8 C) (Rectal)    Resp 16    LMP 04/05/2011    SpO2 97%   Physical Exam Vitals signs and nursing note reviewed.  Constitutional:      Appearance: She is well-developed.     Comments: Chronically ill-appearing.  Appears older than stated age.  HENT:     Head: Normocephalic and atraumatic.     Nose: Nose normal.  Eyes:     Conjunctiva/sclera: Conjunctivae normal.  Neck:     Musculoskeletal: Normal range of motion.  Cardiovascular:     Rate and Rhythm: Normal rate and regular rhythm.  Pulmonary:     Effort: Pulmonary effort is normal.     Breath sounds: Normal breath sounds.  Abdominal:     General: Bowel sounds are normal.     Palpations: Abdomen is soft.     Tenderness: There is abdominal tenderness. There is guarding.     Comments: Tenderness to the lower abdomen with mild firmness, guarding and rebound tenderness.  Pink, serosanguineous drainage around the suprapubic catheter site without overt signs of abscess, cellulitis, purulence.  No CVA tenderness.  There is white sediment and cloudiness in the urine that is draining into the bag.  Musculoskeletal: Normal range of motion.  Skin:    General: Skin is warm and dry.     Capillary Refill: Capillary refill takes less than 2 seconds.     Comments: Mild skin breakdown and scab noted to the sacral area.  No tenderness.  Neurological:  Mental Status: She is alert.     Comments: Awake. Alert. Follows commands. Oriented x 4. Speech is slow and difficult to understand. All extremities slightly contracted, worse in upper extremities. Some muscle wasting throughout upper/lower extremities.   Psychiatric:        Behavior: Behavior normal.      ED Treatments / Results  Labs (all labs ordered are listed, but only abnormal results are displayed) Labs Reviewed  COMPREHENSIVE METABOLIC PANEL - Abnormal; Notable for the  following components:      Result Value   Sodium 134 (*)    CO2 19 (*)    Glucose, Bld 144 (*)    Calcium 8.6 (*)    Total Protein 6.1 (*)    Albumin 3.4 (*)    AST 12 (*)    All other components within normal limits  URINALYSIS, ROUTINE W REFLEX MICROSCOPIC - Abnormal; Notable for the following components:   Color, Urine AMBER (*)    APPearance CLOUDY (*)    Hgb urine dipstick LARGE (*)    Protein, ur 100 (*)    Nitrite POSITIVE (*)    Leukocytes,Ua LARGE (*)    RBC / HPF >50 (*)    WBC, UA >50 (*)    Bacteria, UA FEW (*)    All other components within normal limits  CBC WITH DIFFERENTIAL/PLATELET - Abnormal; Notable for the following components:   RBC 3.81 (*)    Hemoglobin 11.6 (*)    All other components within normal limits  CULTURE, BLOOD (ROUTINE X 2)  CULTURE, BLOOD (ROUTINE X 2)  URINE CULTURE  LIPASE, BLOOD  LACTIC ACID, PLASMA  CBC WITH DIFFERENTIAL/PLATELET  LACTIC ACID, PLASMA    EKG None  Radiology No results found.  Procedures Procedures (including critical care time)  Medications Ordered in ED Medications  fentaNYL (SUBLIMAZE) injection 50 mcg (50 mcg Intravenous Given 05/16/19 1540)  sodium chloride 0.9 % bolus 1,000 mL (1,000 mLs Intravenous New Bag/Given 05/16/19 1543)     Initial Impression / Assessment and Plan / ED Course  I have reviewed the triage vital signs and the nursing notes.  Pertinent labs & imaging results that were available during my care of the patient were reviewed by me and considered in my medical decision making (see chart for details).  Clinical Course as of May 15 1557  Sun May 16, 2019  1439 [phone] aide Angie Fava   [CG]  1525 Spoke to pharmacy regarding antibiotic choice. Will review chart and give recs.    [CG]  1532 Spoke to pharmacist Bryson Ha.  States bugs susceptible to fosfomycin but it was undertreated recommends one time dose in ER and patient can be given another dose in ER prior to discharge.  Consider  cef/taz IV if admitted    [CG]  1546 Pt is only 45 kg, contracted. Non toxic. Baseline BP in low 100s usually. Repeat Bps now at her baseline   BP(!): 86/59 [CG]  1557 Normal AG  Glucose(!): 144 [CG]  1557 Creatinine: 0.72 [CG]    Clinical Course User Index [CG] Kinnie Feil, PA-C      43 yo with h/o CP, chronic suprapubic catheter, recurrent UTI here with persistent worsening suprapubic pain, fatigue.   Recent ER visit and work up on 6/10 for suprapubic abdominal pain reviewed by me. UA at that time grossly infected, she was treated with fosfomycin and discharged with second dose in 48 hours which she was unable to take.  CT showed new L  hydro without stone, stool buden, catheter tip at left UVJ concerning for possible infection so RN deflated balloon with 5 cc to relieve pressure.  Pt was discussed with pharmacy and urology Dr Alinda Money. Urine culture on 6/10 grew pseudomonas and proteus.   Exam reveals rectal temp 100, soft BP. Non toxic appearing. Lower abd is firm, tender with rebound tenderness. Cloudy urine.   Will obtain labs, UA, blood culture, bladder scan although there is urine draining out of catheter. Anticipate consult to pharmacy and urology.  Final Clinical Impressions(s) / ED Diagnoses   5168: Initial hypotension resolved suspect this was positional.  Lab work thus far reassuring.  No leukocytosis, lactic acidosis.  Creatinine WNL.  Pending CT A/P.  Patient will be handed off to oncoming ED PA who will follow-up on imaging and determine disposition.  She is on daily oxycodone for chronic pain, this could be related to constipation, SBO or GU process.  Consider urology consult based on imaging. Final diagnoses:  Lower abdominal pain    ED Discharge Orders    None       Arlean Hopping 05/16/19 1558    Malvin Johns, MD 05/17/19 [phone]

## 2019-05-16 NOTE — Discharge Instructions (Addendum)
Contact a health care provider if:  Your symptoms do not get better after 1-2 days.  Your symptoms go away and then return.  Get help right away if you have:  Severe pain in your back or your lower abdomen.  A fever.  Nausea or vomiting.

## 2019-05-16 NOTE — Telephone Encounter (Signed)
Post ED Visit - Positive Culture Follow-up  Culture report reviewed by antimicrobial stewardship pharmacist: [place] Team []  Elenor Quinones, Pharm.D. []  Heide Guile, Pharm.D., BCPS AQ-ID []  Parks Neptune, Pharm.D., BCPS []  Alycia Rossetti, Pharm.D., BCPS []  [place], Pharm.D., BCPS, AAHIVP []  Legrand Como, Pharm.D., BCPS, AAHIVP []  Salome Arnt, PharmD, BCPS []  Johnnette Gourd, PharmD, BCPS []  Hughes Better, PharmD, BCPS []  Leeroy Cha, PharmD []  Laqueta Linden, PharmD, BCPS []  Albertina Parr, PharmD  [place] Team []  Leodis Sias, PharmD []  Lindell Spar, PharmD []  Royetta Asal, PharmD []  Graylin Shiver, Rph []  Rema Fendt) Glennon Mac, PharmD []  Arlyn Dunning, PharmD []  Netta Cedars, PharmD [x]  Dia Sitter, PharmD []  Leone Haven, PharmD []  Gretta Arab, PharmD []  Theodis Shove, PharmD []  Peggyann Juba, PharmD []  Reuel Boom, PharmD   Positive urine culture Treated with Fosfomycin, organism sensitive to the same and no further patient follow-up is required at this time.  [name] [name] 05/16/2019, 10:54 AM

## 2019-05-16 NOTE — ED Triage Notes (Signed)
Pt arrive via EMS with co UTI that she was diagnosed with last week. EMS was told tht the antibiotics could not get filled bc insurance wouldn't pay for them. Pt has increased abd pain and tenderness. EMS pick her up from a caregiver that sits with her and they had no information to give. Pt states she lives with a roommate.

## 2019-05-19 LAB — URINE CULTURE: Culture: >=100000 — AB

## 2019-05-21 LAB — CULTURE, BLOOD (ROUTINE X 2)
Culture: NO GROWTH
Culture: NO GROWTH

## 2019-05-26 ENCOUNTER — Emergency Department (HOSPITAL_COMMUNITY): Payer: Medicare Other

## 2019-05-26 ENCOUNTER — Emergency Department (HOSPITAL_COMMUNITY)
Admission: EM | Admit: 2019-05-26 | Discharge: 2019-05-27 | Disposition: A | Discharge status: Home / Self Care | Payer: Medicare Other | Attending: Emergency Medicine | Admitting: Emergency Medicine

## 2019-05-26 DIAGNOSIS — Z9359 Other cystostomy status: Secondary | ICD-10-CM

## 2019-05-26 DIAGNOSIS — R109 Unspecified abdominal pain: Secondary | ICD-10-CM | POA: Diagnosis not present

## 2019-05-26 DIAGNOSIS — Z436 Encounter for attention to other artificial openings of urinary tract: Secondary | ICD-10-CM | POA: Diagnosis not present

## 2019-05-26 DIAGNOSIS — Z79899 Other long term (current) drug therapy: Secondary | ICD-10-CM | POA: Insufficient documentation

## 2019-05-26 LAB — LIPASE, BLOOD: Lipase: 25 U/L (ref 11–51)

## 2019-05-26 LAB — COMPREHENSIVE METABOLIC PANEL
ALT: 13 U/L (ref 0–44)
AST: 16 U/L (ref 15–41)
Albumin: 3.6 g/dL (ref 3.5–5.0)
Alkaline Phosphatase: 77 U/L (ref 38–126)
Anion gap: 6 (ref 5–15)
BUN: 8 mg/dL (ref 6–20)
CO2: 24 mmol/L (ref 22–32)
Calcium: 8.8 mg/dL — ABNORMAL LOW (ref 8.9–10.3)
Chloride: 109 mmol/L (ref 98–111)
Creatinine, Ser: 0.73 mg/dL (ref 0.44–1.00)
GFR calc Af Amer: >60 mL/min (ref >60)
GFR calc non Af Amer: >60 mL/min (ref >60)
Glucose, Bld: 106 mg/dL — ABNORMAL HIGH (ref 70–99)
Potassium: 4.1 mmol/L (ref 3.5–5.1)
Sodium: 139 mmol/L (ref 135–145)
Total Bilirubin: 0.4 mg/dL (ref 0.3–1.2)
Total Protein: 6.3 g/dL — ABNORMAL LOW (ref 6.5–8.1)

## 2019-05-26 LAB — CBC WITH DIFFERENTIAL/PLATELET
Abs Immature Granulocytes: 0.05 10*3/uL (ref 0.00–0.07)
Basophils Absolute: 0 10*3/uL (ref 0.0–0.1)
Basophils Relative: 1 %
Eosinophils Absolute: 0.4 10*3/uL (ref 0.0–0.5)
Eosinophils Relative: 7 %
HCT: 36.8 % (ref 36.0–46.0)
Hemoglobin: 11.5 g/dL — ABNORMAL LOW (ref 12.0–15.0)
Immature Granulocytes: 1 %
Lymphocytes Relative: 29 %
Lymphs Abs: 1.8 10*3/uL (ref 0.7–4.0)
MCH: 30 pg (ref 26.0–34.0)
MCHC: 31.3 g/dL (ref 30.0–36.0)
MCV: 96.1 fL (ref 80.0–100.0)
Monocytes Absolute: 0.4 10*3/uL (ref 0.1–1.0)
Monocytes Relative: 7 %
Neutro Abs: 3.4 10*3/uL (ref 1.7–7.7)
Neutrophils Relative %: 55 %
Platelets: 255 10*3/uL (ref 150–400)
RBC: 3.83 MIL/uL — ABNORMAL LOW (ref 3.87–5.11)
RDW: 13.5 % (ref 11.5–15.5)
WBC: 6.1 10*3/uL (ref 4.0–10.5)
nRBC: 0 % (ref 0.0–0.2)

## 2019-05-26 LAB — URINALYSIS, ROUTINE W REFLEX MICROSCOPIC
Bilirubin Urine: NEGATIVE
Glucose, UA: NEGATIVE mg/dL
Ketones, ur: NEGATIVE mg/dL
Nitrite: NEGATIVE
Protein, ur: 30 mg/dL — AB
Specific Gravity, Urine: 1.01 (ref 1.005–1.030)
pH: 7 (ref 5.0–8.0)

## 2019-05-26 LAB — LACTIC ACID, PLASMA: Lactic Acid, Venous: 1 mmol/L (ref 0.5–1.9)

## 2019-05-26 MED ORDER — IOHEXOL 300 MG/ML  SOLN
100.0000 mL | Freq: Once | INTRAMUSCULAR | Status: AC | PRN
  Administered 2019-05-26: 100 mL via INTRAVENOUS

## 2019-05-26 MED ORDER — FENTANYL CITRATE (PF) 100 MCG/2ML IJ SOLN
25.0000 ug | Freq: Once | INTRAMUSCULAR | Status: AC
  Administered 2019-05-26: 25 ug via INTRAVENOUS
  Filled 2019-05-26: qty 2

## 2019-05-26 MED ORDER — OXYCODONE HCL ER 10 MG PO T12A
10.0000 mg | EXTENDED_RELEASE_TABLET | Freq: Two times a day (BID) | ORAL | Status: DC
  Administered 2019-05-27: 10 mg via ORAL
  Filled 2019-05-26: qty 1

## 2019-05-26 MED ORDER — SODIUM CHLORIDE 0.9 % IV BOLUS
1000.0000 mL | Freq: Once | INTRAVENOUS | Status: AC
  Administered 2019-05-26: 1000 mL via INTRAVENOUS

## 2019-05-26 MED ORDER — OXYCODONE HCL 5 MG PO TABS: 10.0000 mg | ORAL_TABLET | Freq: Once | ORAL | Status: DC

## 2019-05-26 NOTE — ED Notes (Signed)
Patient transported to CT 

## 2019-05-26 NOTE — ED Notes (Signed)
CT stated that the IV the pt has is appropriate for her scan.

## 2019-05-26 NOTE — ED Notes (Signed)
Called ptar 

## 2019-05-26 NOTE — Discharge Instructions (Addendum)
You have been seen today for a catheter problem . Please read and follow all provided instructions.   1. Medications: usual home medications 2. Treatment: rest, drink plenty of fluids 3. Follow Up: Please call and schedule an appointment with your urologist tomorrow. Please follow up with your primary doctor in 2 days for discussion of your diagnoses and further evaluation after today's visit; if you do not have a primary care doctor use the resource guide provided to find one; Please return to the ER for any new or worsening symptoms. Please obtain all of your results from medical records or have your doctors office obtain the results - share them with your doctor - you should be seen at your doctors office. Call today to arrange your follow up.   Take medications as prescribed. Please review all of the medicines and only take them if you do not have an allergy to them. Return to the emergency room for worsening condition or new concerning symptoms. Follow up with your regular doctor. If you don't have a regular doctor use one of the numbers below to establish a primary care doctor.  Please be aware that if you are taking birth control pills, taking other prescriptions, ESPECIALLY ANTIBIOTICS may make the birth control ineffective - if this is the case, either do not engage in sexual activity or use alternative methods of birth control such as condoms until you have finished the medicine and your family doctor says it is OK to restart them. If you are on a blood thinner such as COUMADIN, be aware that any other medicine that you take may cause the coumadin to either work too much, or not enough - you should have your coumadin level rechecked in next 7 days if this is the case.  ?  It is also a possibility that you have an allergic reaction to any of the medicines that you have been prescribed - Everybody reacts differently to medications and while MOST people have no trouble with most medicines, you may  have a reaction such as nausea, vomiting, rash, swelling, shortness of breath. If this is the case, please stop taking the medicine immediately and contact your physician.  ?  You should return to the ER if you develop severe or worsening symptoms.   Emergency Department Resource Guide 1) Find a Doctor and Pay Out of Pocket Although you won't have to find out who is covered by your insurance plan, it is a good idea to ask around and get recommendations. You will then need to call the office and see if the doctor you have chosen will accept you as a new patient and what types of options they offer for patients who are self-pay. Some doctors offer discounts or will set up payment plans for their patients who do not have insurance, but you will need to ask so you aren't surprised when you get to your appointment.  2) Contact Your Local Health Department Not all health departments have doctors that can see patients for sick visits, but many do, so it is worth a call to see if yours does. If you don't know where your local health department is, you can check in your phone book. The CDC also has a tool to help you locate your state's health department, and many state websites also have listings of all of their local health departments.  3) Find a Walk-in Clinic If your illness is not likely to be very severe or complicated, you may want  to try a walk in clinic. These are popping up all over the country in pharmacies, drugstores, and shopping centers. They're usually staffed by nurse practitioners or physician assistants that have been trained to treat common illnesses and complaints. They're usually fairly quick and inexpensive. However, if you have serious medical issues or chronic medical problems, these are probably not your best option.  No Primary Care Doctor: Call [facility] at  [phone] - they can help you locate a primary care doctor that  accepts your insurance, provides certain services,  etc. Physician Referral Service- [phone]  Chronic Pain Problems: Organization         Address  Phone   Notes  [facility]  [phone] Patients need to be referred by their primary care doctor.   Medication Assistance: Organization         Address  Phone   Notes  [facility] [address] [place], [place] [address] [phone] --Must be a resident of [facility] -- Must have NO insurance coverage whatsoever (no Medicaid/ Medicare, etc.) -- The pt. MUST have a primary care doctor that directs their care regularly and follows them in the community   [facility]  [phone]   [employer]  [phone]    Agencies that provide inexpensive medical care: Organization         Address  Phone   Notes  [place]  [phone]   [facility]    [phone]   [facility] [place], [place] [address] [phone]   [place] [address] [place] [phone]   [facility]anned [facility]    [phone]   [facility]    [phone]   [place] [address], [place] Phone:  [phone], Fax:  [phone] Hours of Operation:  9 am - 6 pm, M-F.  Also accepts Medicaid/Medicare and self-pay.  [facility] for [place] [address], [address], [place] Phone: [phone], Fax: [phone]. Hours of Operation:  8:30 am - 5:30 pm, M-F.  Also accepts Medicaid and self-pay.  [facility] [address], [place] Phone: [phone]   [place], [place], [place] [phone], Ext. [phone] Mondays & Thursdays: 7-9 AM.  First 15 patients are seen on a first come, first serve basis.    [place] Providers:  Organization         Address  Phone   Notes  [facility] [address], [place] [phone] Also accepts self-pay patients.  [facility] [address], [place]  [phone]   [place], [address], [place] [phone]   [facility] [address], [place] [phone]   Lucianne Lei [address], [place]   [phone] Only accepts [place] patients after they have their name applied to their card.   Self-Pay (no insurance) in [facility]:  Organization         Address  Phone   Notes  Sickle Cell Patients, [facility] [place] [phone]   [facility] [place] [phone]  [facility] [place]  [address], [address], [place] [phone]   [facility]/Dr. Osei-Bonsu  [address] [place] or [address], [place] [phone] Phone number for both [place] and [employer] locations is the same.  [facility] [address], [place] [phone]   [facility] [address] [place] or [address] [phone]   [facility] [address], [place] [phone], phone; [phone], fax Sees patients 1st and 3rd Saturday of every month.  Must not qualify for public or private insurance (i.e. Medicaid, Medicare, [employer], Veterans' Benefits)  Household income should be no more than 200% of the poverty level The clinic cannot treat you if you are pregnant or think you are pregnant  Sexually transmitted diseases are not treated at the clinic.

## 2019-05-26 NOTE — ED Provider Notes (Signed)
[place] EMERGENCY DEPARTMENT Provider Note   CSN: [id number] Arrival date & time: 05/26/19  1443    History   Chief Complaint Chief Complaint  Patient presents with   Suprapubic catheter problem    HPI [name] [name] is a 43 y.o. female with a PMH of Cerebral Palsy, Suprapubic catheter, and recurrent UTIs presents due to difficulty with suprapubic catheter. Patient [facility] via EMS from adult day center. Patient is wheelchair bound at baseline. Patient is a poor historian due to cerebral palsy. Patient's speech is difficult to understand, but this is her baseline.  Level 5 caveat due to cerebral palsy.   Called father, Lexy Meininger, and gathered additional information. Father reports patient has had multiple UTIs in the past and has completed her antibiotics. Father reports her suprapubic catheter was last changed on 06/16. Father reports patient is seen by urologist, Dr. Amalia Hailey in [facility]. Father reports patient has intermittent lower pain and leakage from her suprapubic catheter. Father reports patient's catheter started leaking and bothering the patient again yesterday. Father reports she receives botox injections. Father denies fever, chills, nausea, vomiting, or diarrhea. Father reports patient has been eating a little less and sleeping a little more, but otherwise denies any other changes. Father reports patient has an appointment with Dr. Amalia Hailey on 07/10.   Per chart review, patient was evaluated on 06/14 for similar symptoms in the ER. Patient was found to have a UTI and treated with fosfomycin. Patient had a CT abdomen that revealed new L hydronephrosis without stone and stool burden. Patient was discharged at that time and given antibiotics for UTI.       HPI  Past Medical History:  Diagnosis Date   Acute respiratory failure with hypoxia ([place])    Anemia    Cellulitis 12/22/2014   Cerebral palsy ([name])    Spastic Cerebral palsy, mentally intact     Contracture, joint, multiple sites    Electric wheelchair, uses left hand to operate chair.    Depression    [name]    Dysarthria    GERD (gastroesophageal reflux disease)    Gram positive sepsis ([place]) 10/24/2014   HCAP (healthcare-associated pneumonia)    2015, 2018   History of endometriosis 10/2012   OBGYN [facility]: Laparoscopic Endometrial Ablation, TVH,   History of recurrent UTIs    Hypertension    Incontinent of feces 03/11/2016   Intracervical pessary 05/07/2011   Overview:  Overview:  Placed by [place] 04/30/11 to treat DUB and Endometriosis.  She is seen at GYN clinic at [facility] but was considered a poor surgical candidate and referred her to [facility].  For DUB she had pelvic ultrasound that showed thin stripe and she had endometrial Bx by Dr Nori Riis in [place] [facility], which was negative.      Macroscopic hematuria 03/10/2013   status post replacement of suprapubic tube 03/04/2013    Migraine    OCD (obsessive compulsive disorder)    Parapneumonic effusion 10/25/2014   Presence of intrathecal baclofen pump 03/07/2014   R LQ. Insertion T10.     Psychiatric illness 10/31/2014   Recurrent pulmonary embolism ([name])    Pt has history of recurrent PE and is on lifelong coumadin.    Seizures ([place]) 02/12/2016   Spasticity 01/05/2014   Transient alteration of awareness, recurrent 09/08/2006   Recurrent episodes where [place]  loses contact with her world and that have been studied thoroughly and appear nonepileptic in nature per Dr  Krista Blue (neurology) The patient has had episodes starting in 2007 that initially began 1-4 times a day during which time she would have periods of unawareness followed by confusion. This continuous video EEG monitoring performed from October 8-12, 2007. Had    Urethra dilated and patulous 07/03/2015   Patulous, Dilated Urethra. 21m balloon on a 22-Fr catheter hoping this would prevent the bladder spasm from pushing the balloon  down the urethra (Dr EAmalia Hailey [facility], July 2016)     Patient Active Problem List   Diagnosis Date Noted   Pressure injury of buttock, stage 2 ([place] 04/20/2019   Palliative care encounter    Cough 01/04/2019   Bad taste in mouth 01/04/2019   Hypotension 11/19/2018   Urinary tract infection associated with indwelling urethral catheter ([place] 11/18/2018   Solitary pulmonary nodule on lung CT 10/28/2018   Connective tissue disorder ([place] 08/07/2018   Migraine without aura and without status migrainosus, not intractable 06/26/2018   UTI (urinary tract infection) 05/25/2018   Headache 05/25/2018   Incontinent of feces 03/11/2016   Chronic anticoagulation 03/11/2016   Involuntary movements 02/22/2016   Seizures ([place] 02/12/2016   Dyspnea 02/12/2016   Cerebral palsy, quadriplegic ([place] 02/06/2016   Swelling of extremity 01/13/2016   Nausea without vomiting 11/09/2015   Fever    Pressure ulcer 10/09/2015   Status post insertion of inferior vena caval filter 09/28/2015   Absence of bladder continence 08/25/2015   Person living in residential institution 06/14/2015   Pulmonary hypertension ([place]    Anemia of chronic disease    Spasticity 03/15/2015   Constipation, chronic 10/10/2014   Athetoid cerebral palsy ([place] 04/13/2014   Dystonia 04/12/2014   UTI (lower urinary tract infection) 03/16/2014   Presence of intrathecal baclofen pump 03/07/2014   Dental caries 07/18/2013   Multiple thyroid nodules 05/20/2013   Osteoarthrosis 04/07/2013   [name]    Dysarthria    GERD (gastroesophageal reflux disease)    Neurogenic bladder 02/05/2013   Anxiety state 10/21/2012   Chronic pain syndrome 06/17/2012   Recurrent pulmonary embolism ([place] 05/03/2011   Pulmonary embolism with infarction ([place] 05/03/2011   Chronic suprapubic catheter ([place] 03/15/2011   Disease of female genital organs 10/24/2010   HYPERTENSION,  BENIGN 01/31/2010   CP (cerebral palsy), spastic ([place] 12/27/2008   Contracted, joint, multiple sites 12/27/2008   Major depressive disorder, recurrent episode ([place] 01/29/2007   Obsessive Compulsive Disorder 01/29/2007   Transient alteration of awareness, recurrent 09/08/2006    Past Surgical History:  Procedure Laterality Date   ABDOMINAL HYSTERECTOMY     APPENDECTOMY     BACLOFEN PUMP REFILL     x 3 times   CARPAL TUNNEL RELEASE  08/2008   Dr SDaylene Katayama  CESAREAN SECTION     x 2   CHOLECYSTECTOMY  10/14/2015   Procedure: LAPAROSCOPIC CHOLECYSTECTOMY;  Surgeon: BGeorganna Skeans MD;  [facility];  Service: General;;   COLPOSCOPY  06/2000   INTRAUTERINE DEVICE INSERTION  04/30/11   Insert[place] by [place]   LAPAROSCOPIC ASSISTED VAGINAL HYSTERECTOMY  10/27/2012   MULTIPLE EXTRACTIONS WITH ALVEOLOPLASTY Bilateral 07/19/2013   Procedure: EXTRACTIONS #4, 59,02,40,97  Surgeon: CIsac Caddy DDS;  [place]  Service: Oral Surgery;  Laterality: Bilateral;   NEUROMA SURGERY Left    anterior and posterior intersosseous   PAIN PUMP IMPLANTATION N/A 03/07/2014   Procedure: baclofen pump revision/replacement and Catheter connection replacement;  Surgeon: JOphelia Charter MD;  [facility];  Service: Neurosurgery;  Laterality: N/A;  baclofen pump revision/replacement and Catheter connection replacement   PROGRAMABLE BACLOFEN PUMP REVISION  03/17/14   Battery Replacement    TUBAL LIGATION  2003   URETHRA SURGERY     WRIST SURGERY  06/2010   Dr Nicoletta Dress, hand surgeon, [name]     OB History   No obstetric history on file.      Home Medications    Prior to Admission medications   Medication Sig Start Date End Date Taking? Authorizing Provider  acetaminophen (TYLENOL 8 HOUR) 650 MG CR tablet Take 1 tablet (650 mg total) by mouth every 8 (eight) hours as needed. Patient not taking: Reported on 05/16/2019 07/08/18   Varney Biles,  MD  acetaminophen (TYLENOL) 500 MG tablet Take 1,000 mg by mouth 2 (two) times daily as needed for headache (pain).    [provider]  amantadine (SYMMETREL) 100 MG capsule Take 100 mg by mouth daily.     [provider]  azelastine (ASTELIN) 0.1 % nasal spray Place 2 sprays into both nostrils 2 (two) times daily. Use in each nostril as directed    [provider]  baclofen (LIORESAL) 20 MG tablet Take 1 tablet (20 mg total) by mouth 4 (four) times daily. For bladder spasms 12/28/18   Rockwell Germany, NP  bisacodyl (DULCOLAX) 5 MG EC tablet Take 15 mg by mouth daily as needed (constipation).    [provider]  Buprenorphine HCl (BELBUCA) 900 MCG FILM Place 900 mcg inside cheek every 12 (twelve) hours.    [provider]  busPIRone (BUSPAR) 5 MG tablet Take 5 mg by mouth 2 (two) times daily.     [provider]  clonazePAM (KLONOPIN) 0.5 MG tablet Take 1.5 tablets (0.75 mg total) by mouth 3 (three) times daily. 11/27/17   Mariel Aloe, MD  feeding supplement, ENSURE ENLIVE, (ENSURE ENLIVE) LIQD Take 237 mLs by mouth 2 (two) times daily between meals. 05/30/18   Kayleen Memos, DO  ferrous sulfate 325 (65 FE) MG tablet Take 325 mg by mouth daily with breakfast.    [provider]  FLUoxetine (PROZAC) 40 MG capsule Take 80 mg by mouth daily.    [provider]  guaiFENesin (MUCINEX) 600 MG 12 hr tablet Take 600 mg by mouth 2 (two) times daily as needed for cough or to loosen phlegm.     [provider]  hydroxychloroquine (PLAQUENIL) 200 MG tablet Take 200 mg by mouth daily.    [provider]  lamoTRIgine (LAMICTAL) 100 MG tablet Take 100 mg by mouth at bedtime.    [provider]  linaclotide (LINZESS) 145 MCG CAPS capsule Take 145 mcg by mouth daily before breakfast.     [provider]  Melatonin 5 MG TABS Take 5 mg by mouth at bedtime.     [provider]  naloxegol oxalate  (MOVANTIK) 25 MG TABS tablet Take 25 mg by mouth daily.    [provider]  NARCAN 4 MG/0.1ML LIQD nasal spray kit Place 4 mg into the nose once as needed (severe opioid overdose).  02/24/19   [provider]  omeprazole (PRILOSEC) 20 MG capsule Take 1 capsule (20 mg total) by mouth daily. 01/04/19   Horald Pollen, MD  ondansetron (ZOFRAN) 4 MG tablet Take 1 tablet (4 mg total) by mouth every 6 (six) hours as needed for nausea. 11/19/18   Georgette Shell, MD  oxybutynin (DITROPAN) 5 MG  tablet Take 5 mg by mouth 4 (four) times daily.  02/20/16   [provider]  Oxycodone HCl 10 MG TABS Take 10 mg by mouth every 6 (six) hours as needed (pain).    [provider]  polyethylene glycol powder (GLYCOLAX/MIRALAX) powder Take 1  1/2 dose  daily in 12 ounces of fluid every day. Patient taking differently: Take 17 g by mouth every evening.  01/01/17   Esterwood, [name] S, PA-C  QUEtiapine (SEROQUEL) 50 MG tablet Take 150 mg by mouth at bedtime.  01/08/19   [provider]  rivaroxaban (XARELTO) 10 MG TABS tablet Take 10 mg by mouth daily.     [provider]  Skin Protectants, Misc. (EUCERIN) cream Apply topically as needed for wound care. Patient taking differently: Apply 1 application topically daily as needed for wound care.  02/05/19   Albrizze, Kaitlyn E, PA-C  tiZANidine (ZANAFLEX) 4 MG capsule Take 1 capsule (4 mg total) by mouth 2 (two) times daily. 12/28/18   Rockwell Germany, NP  topiramate (TOPAMAX) 100 MG tablet Take 1 tablet at bedtime Patient taking differently: Take 100 mg by mouth at bedtime.  02/10/19   Rockwell Germany, NP  venlafaxine XR (EFFEXOR-XR) 37.5 MG 24 hr capsule Take 37.5 mg by mouth daily. 01/08/19   [provider]    Family History Family History  Problem Relation Age of Onset   Asthma Father    Colon cancer Maternal Grandmother        Died in her 91's   Breast cancer Paternal Grandmother        Died in  her 61's   Heart attack Maternal Grandfather        Died in his 34's   Alzheimer's disease Paternal Grandfather        Died in his 68's   Breast cancer Mother    Stomach cancer Neg Hx     Social History Social History   Tobacco Use   Smoking status: Never Smoker   Smokeless tobacco: Never Used  Substance Use Topics   Alcohol use: Yes    Alcohol/week: 1.0 standard drinks    Types: 1 Glasses of wine per week    Comment: Drinks alcohol once a month.   Drug use: No     Allergies   Morphine   Review of Systems Review of Systems  Unable to perform ROS: Other (Pt has cerebral palsy.)     Physical Exam Updated Vital Signs BP 124/84    Pulse 63    Temp 98.7 F (37.1 C) (Oral)    Resp 18    LMP 04/05/2011    SpO2 97%   Physical Exam Vitals signs and nursing note reviewed.  Constitutional:      General: She is not in acute distress.    Appearance: She is well-developed. She is not diaphoretic.  HENT:     Head: Normocephalic and atraumatic.     Mouth/Throat:     Mouth: Mucous membranes are moist.     Pharynx: No posterior oropharyngeal erythema.  Neck:     Musculoskeletal: Normal range of motion.  Cardiovascular:     Rate and Rhythm: Normal rate and regular rhythm.     Heart sounds: Normal heart sounds. No murmur. No friction rub. No gallop.   Pulmonary:     Effort: Pulmonary effort is normal. No respiratory distress.     Breath sounds: Normal breath sounds. No wheezing or rales.  Abdominal:     General: Abdomen  is flat. Bowel sounds are normal.     Palpations: Abdomen is soft.     Tenderness: There is abdominal tenderness. There is guarding. There is no right CVA tenderness, left CVA tenderness or rebound.    Musculoskeletal: Normal range of motion.  Skin:    General: Skin is warm.     Findings: No erythema or rash.  Neurological:     Mental Status: She is alert and oriented to person, place, and time. Mental status is at baseline.      ED  Treatments / Results  Labs (all labs ordered are listed, but only abnormal results are displayed) Labs Reviewed  URINALYSIS, ROUTINE W REFLEX MICROSCOPIC - Abnormal; Notable for the following components:      Result Value   Color, Urine AMBER (*)    APPearance CLOUDY (*)    Hgb urine dipstick MODERATE (*)    Protein, ur 30 (*)    Leukocytes,Ua LARGE (*)    Bacteria, UA MANY (*)    All other components within normal limits  COMPREHENSIVE METABOLIC PANEL - Abnormal; Notable for the following components:   Glucose, Bld 106 (*)    Calcium 8.8 (*)    Total Protein 6.3 (*)    All other components within normal limits  CBC WITH DIFFERENTIAL/PLATELET - Abnormal; Notable for the following components:   RBC 3.83 (*)    Hemoglobin 11.5 (*)    All other components within normal limits  URINE CULTURE  CULTURE, BLOOD (ROUTINE X 2)  CULTURE, BLOOD (ROUTINE X 2)  LIPASE, BLOOD  LACTIC ACID, PLASMA    EKG None  Radiology Ct Abdomen Pelvis W Contrast  Result Date: 05/26/2019 CLINICAL DATA:  Cerebral palsy with chronic constipation and pain. Reported difficulty with suprapubic urinary catheter EXAM: CT ABDOMEN AND PELVIS WITH CONTRAST TECHNIQUE: Multidetector CT imaging of the abdomen and pelvis was performed using the standard protocol following bolus administration of intravenous contrast. Oral contrast also administered. CONTRAST:  143m OMNIPAQUE IOHEXOL 300 MG/ML  SOLN COMPARISON:  May 16, 2019 FINDINGS: Lower chest: There is atelectatic change in the lung bases. No frank airspace consolidation noted in the lung bases. Hepatobiliary: No focal liver lesions are identified. Note that there is a degree of motion artifact which makes evaluation less than optimal. Gallbladder is absent. There is no over biliary duct dilatation. Pancreas: There is no evident pancreatic mass or inflammatory focus. Spleen: No splenic lesions are evident. Adrenals/Urinary Tract: Adrenals appear normal. There is a cyst  arising in the lateral mid left kidney measuring 1.4 x 1.0 cm. There is no appreciable hydronephrosis on either side. There is no evident renal or ureteral calculus on either side. There is a suprapubic place catheter within the bladder. Bladder wall is thickened, likely due to chronic cystitis. Stomach/Bowel: The rectum is distended with stool and air. There is extensive stool throughout the colon. There is generalized bowel dilatation in a pattern suggesting ileus. No focal transition zone to suggest bowel obstruction evident. There is no demonstrable free air or portal venous air. There is colonic interposition between the right hemidiaphragm and liver, a stable finding. Vascular/Lymphatic: No abdominal aortic aneurysm. No arterial vascular lesions are evident. There is a filter in the inferior vena cava. Several limbs of the filter appear to extend beyond the confines of the inferior vena cava, a stable appearance. No adenopathy evident in the abdomen or pelvis. Reproductive: Uterus absent.  No evident pelvic mass. Other: Stimulator device present over the right lower quadrant with  lead extending into the thecal sac. Stimulator lead extends into the lower thoracic region. No abscess or ascites is evident in the abdomen or pelvis. There is no periappendiceal region inflammatory change. Appendix is absent. Musculoskeletal: No blastic or lytic bone lesions are evident. There are no focal intramuscular lesions. IMPRESSION: 1. Extensive stool throughout the colon and rectum with distention of much of the colon and rectum due to stool. There is a degree of generalized bowel dilatation, likely due to chronic ileus. No free air. No frank bowel obstruction noted. 2. Suprapubic catheter in place. Urinary bladder wall thickening, likely due to chronic cystitis. No hydronephrosis on either side. No renal or ureteral calculus on either side. 3.  Gallbladder and uterus absent.  Appendix absent. 4.  Inferior vena cava filter,  unchanged in position. 5. Stimulator device over the right lower quadrant anteriorly. Stimulator lead tip in lower thoracic region. Electronically Signed   By: Lowella Grip III M.D.   On: 05/26/2019 19:44    Procedures Procedures (including critical care time)  Medications Ordered in ED Medications  fentaNYL (SUBLIMAZE) injection 25 mcg (has no administration in time range)  sodium chloride 0.9 % bolus 1,000 mL (0 mLs Intravenous Stopped 05/26/19 1834)  fentaNYL (SUBLIMAZE) injection 25 mcg (25 mcg Intravenous Given 05/26/19 1853)  iohexol (OMNIPAQUE) 300 MG/ML solution 100 mL (100 mLs Intravenous Contrast Given 05/26/19 1909)     Initial Impression / Assessment and Plan / ED Course  I have reviewed the triage vital signs and the nursing notes.  Pertinent labs & imaging results that were available during my care of the patient were reviewed by me and considered in my medical decision making (see chart for details).  Clinical Course as of May 25 2140  Wed May 26, 2019  1734 UA reveals large leukocytes, many bacteria, moderate Hgb, WBCs, RBCs, and protein. Urine culture ordered.  Urinalysis, Routine w reflex microscopic(!) [AH]  1743 Low hemoglobin at 11.5. This appears to be baseline.  Hemoglobin(!): 11.5 [AH]  1743 WBCs are within normal limits.  WBC: 6.1 [AH]  1949 Reassessed patient. Patient states she is comfortable and not in pain at this time.   [AH]  1950 1. Extensive stool throughout the colon and rectum with distention of much of the colon and rectum due to stool. There is a degree of generalized bowel dilatation, likely due to chronic ileus. No free air. No frank bowel obstruction noted.  2. Suprapubic catheter in place. Urinary bladder wall thickening, likely due to chronic cystitis. No hydronephrosis on either side. No renal or ureteral calculus on either side.  3. Gallbladder and uterus absent. Appendix absent.  4. Inferior vena cava filter, unchanged in  position.  5. Stimulator device over the right lower quadrant anteriorly. Stimulator lead tip in lower thoracic region.     CT Abdomen Pelvis W Contrast [AH]  2139 Called and updated father. Father states he understands and agrees with plan.    [AH]    Clinical Course User Index [AH] Arville Lime, [place]      Patient presents with a suprapubic catheter problem. Patient has had leakage and pain around the catheter. Patient is afebrile and WBCs are within normal limits. UA reveals leukocytes, bacteria, WBCs, RBCs, and protein. Ordered urine culture. Provided pain medicine and IVF in the ER. CT reveals urinary wall thickening consistent with chronic cystitis but no signs of hydronephrosis. Discussed case with urologist, and he does not recommend antibiotics due to resistance or further treatment at  this time. Urologist recommends discharge with close follow up with Dr. Amalia Hailey tomorrow. Patient is stable in no acute distress. Father and patient are agreeable to plan. Patient will be discharged.   Findings and plan of care discussed with supervising physician Dr. Lita Mains.  Final Clinical Impressions(s) / ED Diagnoses   Final diagnoses:  Suprapubic catheter [facility])    ED Discharge Orders    None       Julienne Kass 05/26/19 2212    Julianne Rice, MD 05/26/19 2316

## 2019-05-26 NOTE — ED Triage Notes (Signed)
Pt arrived via EMS for difficulty with her suprapubic catheter. Pt coming from adult day center per EMS. Pt is wheelchair bound at baseline. Pt has pmh of CP. Per EMS patient BP 100/72 with a HR of 75-80.

## 2019-05-27 DIAGNOSIS — Z436 Encounter for attention to other artificial openings of urinary tract: Secondary | ICD-10-CM | POA: Diagnosis not present

## 2019-05-28 ENCOUNTER — Other Ambulatory Visit: Payer: Self-pay

## 2019-05-28 ENCOUNTER — Emergency Department (HOSPITAL_COMMUNITY)
Admission: EM | Admit: 2019-05-28 | Discharge: 2019-05-29 | Disposition: A | Discharge status: Home / Self Care | Payer: Medicare Other | Attending: Emergency Medicine | Admitting: Emergency Medicine

## 2019-05-28 DIAGNOSIS — N3001 Acute cystitis with hematuria: Secondary | ICD-10-CM | POA: Insufficient documentation

## 2019-05-28 DIAGNOSIS — Z79899 Other long term (current) drug therapy: Secondary | ICD-10-CM | POA: Diagnosis not present

## 2019-05-28 DIAGNOSIS — R319 Hematuria, unspecified: Secondary | ICD-10-CM | POA: Diagnosis present

## 2019-05-28 DIAGNOSIS — G800 Spastic quadriplegic cerebral palsy: Secondary | ICD-10-CM | POA: Diagnosis not present

## 2019-05-28 LAB — URINALYSIS, ROUTINE W REFLEX MICROSCOPIC
Bilirubin Urine: NEGATIVE
Glucose, UA: NEGATIVE mg/dL
Ketones, ur: NEGATIVE mg/dL
Nitrite: NEGATIVE
Protein, ur: 100 mg/dL — AB
RBC / HPF: >50 RBC/hpf — ABNORMAL HIGH (ref 0–5)
Specific Gravity, Urine: 1.011 (ref 1.005–1.030)
WBC, UA: >50 WBC/hpf — ABNORMAL HIGH (ref 0–5)
pH: 6 (ref 5.0–8.0)

## 2019-05-28 LAB — BASIC METABOLIC PANEL
Anion gap: 10 (ref 5–15)
BUN: 10 mg/dL (ref 6–20)
CO2: 25 mmol/L (ref 22–32)
Calcium: 9 mg/dL (ref 8.9–10.3)
Chloride: 106 mmol/L (ref 98–111)
Creatinine, Ser: 0.53 mg/dL (ref 0.44–1.00)
GFR calc Af Amer: >60 mL/min (ref >60)
GFR calc non Af Amer: >60 mL/min (ref >60)
Glucose, Bld: 93 mg/dL (ref 70–99)
Potassium: 3.9 mmol/L (ref 3.5–5.1)
Sodium: 141 mmol/L (ref 135–145)

## 2019-05-28 LAB — CBC WITH DIFFERENTIAL/PLATELET
Abs Immature Granulocytes: 0.02 10*3/uL (ref 0.00–0.07)
Basophils Absolute: 0 10*3/uL (ref 0.0–0.1)
Basophils Relative: 0 %
Eosinophils Absolute: 0.3 10*3/uL (ref 0.0–0.5)
Eosinophils Relative: 5 %
HCT: 37.7 % (ref 36.0–46.0)
Hemoglobin: 11.7 g/dL — ABNORMAL LOW (ref 12.0–15.0)
Immature Granulocytes: 0 %
Lymphocytes Relative: 29 %
Lymphs Abs: 1.7 10*3/uL (ref 0.7–4.0)
MCH: 30.1 pg (ref 26.0–34.0)
MCHC: 31 g/dL (ref 30.0–36.0)
MCV: 96.9 fL (ref 80.0–100.0)
Monocytes Absolute: 0.5 10*3/uL (ref 0.1–1.0)
Monocytes Relative: 8 %
Neutro Abs: 3.4 10*3/uL (ref 1.7–7.7)
Neutrophils Relative %: 58 %
Platelets: 260 10*3/uL (ref 150–400)
RBC: 3.89 MIL/uL (ref 3.87–5.11)
RDW: 13.6 % (ref 11.5–15.5)
WBC: 5.9 10*3/uL (ref 4.0–10.5)
nRBC: 0 % (ref 0.0–0.2)

## 2019-05-28 MED ORDER — CIPROFLOXACIN IN D5W 400 MG/200ML IV SOLN
400.0000 mg | Freq: Once | INTRAVENOUS | Status: AC
  Administered 2019-05-28: 400 mg via INTRAVENOUS
  Filled 2019-05-28: qty 200

## 2019-05-28 MED ORDER — FENTANYL CITRATE (PF) 100 MCG/2ML IJ SOLN
50.0000 ug | Freq: Once | INTRAMUSCULAR | Status: AC
  Administered 2019-05-28: 50 ug via INTRAVENOUS
  Filled 2019-05-28: qty 2

## 2019-05-28 MED ORDER — CIPROFLOXACIN HCL 250 MG PO TABS: 250.0000 mg | ORAL_TABLET | Freq: Two times a day (BID) | ORAL | 0 refills | Status: AC

## 2019-05-28 MED ORDER — ACETAMINOPHEN 500 MG PO TABS
1000.0000 mg | ORAL_TABLET | Freq: Once | ORAL | Status: AC
  Administered 2019-05-28: 1000 mg via ORAL
  Filled 2019-05-28: qty 2

## 2019-05-28 NOTE — ED Notes (Signed)
Bed: WA02 Expected date:  Expected time:  Means of arrival:  Comments: EMS-blood in catheter

## 2019-05-28 NOTE — ED Notes (Signed)
ED Provider at bedside. 

## 2019-05-28 NOTE — ED Triage Notes (Signed)
PerEMS: PT reports blood in her suprapubic catheter. NO blood noted by EMS. PT was seen 2 days ago at [facility] and advised to followup with her PCP. Pt's caregiver not satisfied with this and was told to communicate to ED staff to "actually do something this time"

## 2019-05-28 NOTE — ED Notes (Signed)
RN called to room. Patient reports pain 10/10. RN talked to patient about chronic pain and the difficulties of managing it. Patient became frustrated with RN and stated "I don't have chronic pain." Patient has been seen multiple times for abdominal pain, hematuria, and difficulties with her catheter.  MD made aware of pt reports of pain

## 2019-05-28 NOTE — ED Provider Notes (Signed)
[facility] DEPT Provider Note  CSN: [id number] Arrival date & time: 05/28/19 1425  Chief Complaint(s) Hematuria  HPI [name] [name] is a 43 y.o. female with a history of CP and suprapubic catheter with recurrent urinary tract infections presents with suprapubic pain.  Patient was seen recently and noted to have a urinary tract infection with Pseudomonas and Klebsiella which were nearly pansensitive.  At that time as a discussion with urology who opted not to treat patient with antibiotics given her prior history of multidrug-resistant bacteria.  She presents for continued pain.  Denies any fevers.  No nausea or vomiting.  No other physical complaints.  HPI  Past Medical History Past Medical History:  Diagnosis Date  . Acute respiratory failure with hypoxia ([place])   . Anemia   . Cellulitis 12/22/2014  . Cerebral palsy ([name])    Spastic Cerebral palsy, mentally intact  . Contracture, joint, multiple sites    Electric wheelchair, uses left hand to operate chair.   . Depression   . [name]   . Dysarthria   . GERD (gastroesophageal reflux disease)   . Gram positive sepsis ([place]) 10/24/2014  . HCAP (healthcare-associated pneumonia)    2015, 2018  . History of endometriosis 10/2012   OBGYN [facility]: Laparoscopic Endometrial Ablation, TVH,  . History of recurrent UTIs   . Hypertension   . Incontinent of feces 03/11/2016  . Intracervical pessary 05/07/2011   Overview:  Overview:  Placed by [facility] 04/30/11 to treat DUB and Endometriosis.  She is seen at GYN clinic at [facility] but was considered a poor surgical candidate and referred her to [facility].  For DUB she had pelvic ultrasound that showed thin stripe and she had endometrial Bx by Dr Nori Riis in [place] clinic, which was negative.     . Macroscopic hematuria 03/10/2013   status post replacement of suprapubic tube 03/04/2013   . Migraine   . OCD (obsessive compulsive disorder)   .  Parapneumonic effusion 10/25/2014  . Presence of intrathecal baclofen pump 03/07/2014   R LQ. Insertion T10.    [name] Kitchen Psychiatric illness 10/31/2014  . Recurrent pulmonary embolism ([name])    Pt has history of recurrent PE and is on lifelong coumadin.   . Seizures ([place]) 02/12/2016  . Spasticity 01/05/2014  . Transient alteration of awareness, recurrent 09/08/2006   Recurrent episodes where Theodora  loses contact with her world and that have been studied thoroughly and appear nonepileptic in nature per Dr Krista Blue (neurology) The patient has had episodes starting in 2007 that initially began 1-4 times a day during which time she would have periods of unawareness followed by confusion. This continuous video EEG monitoring performed from October 8-12, 2007. Had   . Urethra dilated and patulous 07/03/2015   Patulous, Dilated Urethra. 35m balloon on a 22-Fr catheter hoping this would prevent the bladder spasm from pushing the balloon down the urethra (Dr EAmalia Hailey [facility], July 2016)    Patient Active Problem List   Diagnosis Date Noted  . Pressure injury of buttock, stage 2 ([facility] 04/20/2019  . Palliative care encounter   . Cough 01/04/2019  . Bad taste in mouth 01/04/2019  . Hypotension 11/19/2018  . Urinary tract infection associated with indwelling urethral catheter ([facility] 11/18/2018  . Solitary pulmonary nodule on lung CT 10/28/2018  . Connective tissue disorder ([facility] 08/07/2018  . Migraine without aura and without status migrainosus, not intractable 06/26/2018  . UTI (urinary tract infection) 05/25/2018  .  Headache 05/25/2018  . Incontinent of feces 03/11/2016  . Chronic anticoagulation 03/11/2016  . Involuntary movements 02/22/2016  . Seizures ([place]) 02/12/2016  . Dyspnea 02/12/2016  . Cerebral palsy, quadriplegic ([place]) 02/06/2016  . Swelling of extremity 01/13/2016  . Nausea without vomiting 11/09/2015  . Fever   . Pressure ulcer 10/09/2015  . Status post insertion of inferior vena caval filter  09/28/2015  . Absence of bladder continence 08/25/2015  . Person living in residential institution 06/14/2015  . Pulmonary hypertension ([place])   . Anemia of chronic disease   . Spasticity 03/15/2015  . Constipation, chronic 10/10/2014  . Athetoid cerebral palsy ([place]) 04/13/2014  . Dystonia 04/12/2014  . UTI (lower urinary tract infection) 03/16/2014  . Presence of intrathecal baclofen pump 03/07/2014  . Dental caries 07/18/2013  . Multiple thyroid nodules 05/20/2013  . Osteoarthrosis 04/07/2013  . [name]   . Dysarthria   . GERD (gastroesophageal reflux disease)   . Neurogenic bladder 02/05/2013  . Anxiety state 10/21/2012  . Chronic pain syndrome 06/17/2012  . Recurrent pulmonary embolism ([place]) 05/03/2011  . Pulmonary embolism with infarction ([place]) 05/03/2011  . Chronic suprapubic catheter ([place]) 03/15/2011  . Disease of female genital organs 10/24/2010  . HYPERTENSION, BENIGN 01/31/2010  . CP (cerebral palsy), spastic ([place]) 12/27/2008  . Contracted, joint, multiple sites 12/27/2008  . Major depressive disorder, recurrent episode ([place]) 01/29/2007  . Obsessive Compulsive Disorder 01/29/2007  . Transient alteration of awareness, recurrent 09/08/2006   Home Medication(s) Prior to Admission medications   Medication Sig Start Date End Date Taking? Authorizing Provider  acetaminophen (TYLENOL) 500 MG tablet Take 1,000 mg by mouth 2 (two) times daily as needed for headache (pain).   Yes [provider]  amantadine (SYMMETREL) 100 MG capsule Take 100 mg by mouth daily.    Yes [provider]  azelastine (ASTELIN) 0.1 % nasal spray Place 2 sprays into both nostrils 2 (two) times daily. Use in each nostril as directed   Yes [provider]  baclofen (LIORESAL) 20 MG tablet Take 1 tablet (20 mg total) by mouth 4 (four) times daily. For bladder spasms 12/28/18  Yes Rockwell Germany, NP  bisacodyl (DULCOLAX) 5 MG EC tablet Take 15 mg by  mouth daily as needed (constipation).   Yes [provider]  Buprenorphine HCl (BELBUCA) 900 MCG FILM Place 900 mcg inside cheek every 12 (twelve) hours.   Yes [provider]  busPIRone (BUSPAR) 5 MG tablet Take 5 mg by mouth 2 (two) times daily.    Yes [provider]  clonazePAM (KLONOPIN) 0.5 MG tablet Take 1.5 tablets (0.75 mg total) by mouth 3 (three) times daily. 11/27/17  Yes Mariel Aloe, MD  feeding supplement, ENSURE ENLIVE, (ENSURE ENLIVE) LIQD Take 237 mLs by mouth 2 (two) times daily between meals. 05/30/18  Yes Hall, Carole N, DO  FLUoxetine (PROZAC) 40 MG capsule Take 80 mg by mouth daily.   Yes [provider]  guaiFENesin (MUCINEX) 600 MG 12 hr tablet Take 600 mg by mouth 2 (two) times daily as needed for cough or to loosen phlegm.    Yes [provider]  hydroxychloroquine (PLAQUENIL) 200 MG tablet Take 200 mg by mouth daily.   Yes [provider]  lamoTRIgine (LAMICTAL) 100 MG tablet Take 100 mg by mouth at bedtime.   Yes [provider]  linaclotide (LINZESS) 145 MCG CAPS capsule Take 145 mcg by mouth daily before breakfast.    Yes [provider]  Melatonin 5 MG TABS Take 5 mg by mouth at bedtime.    Yes [provider]  naloxegol oxalate (MOVANTIK) 25 MG TABS tablet Take 25 mg by mouth daily.   Yes [provider]  omeprazole (PRILOSEC) 20 MG capsule Take 1 capsule (20 mg total) by mouth daily. 01/04/19  Yes Sagardia, Ines Bloomer, MD  oxybutynin (DITROPAN) 5 MG tablet Take 5 mg by mouth 4 (four) times daily.  02/20/16  Yes [provider]  Oxycodone HCl 10 MG TABS Take 10 mg by mouth every 6 (six) hours as needed (pain).   Yes [provider]  QUEtiapine (SEROQUEL) 50 MG tablet Take 150 mg by mouth at bedtime.  01/08/19  Yes [provider]  rivaroxaban (XARELTO) 10 MG TABS tablet Take 10 mg by mouth daily.    Yes [provider]  Skin Protectants, Misc.  (EUCERIN) cream Apply topically as needed for wound care. Patient taking differently: Apply 1 application topically daily as needed for wound care.  02/05/19  Yes Albrizze, Kaitlyn E, PA-C  topiramate (TOPAMAX) 100 MG tablet Take 1 tablet at bedtime Patient taking differently: Take 100 mg by mouth at bedtime.  02/10/19  Yes Rockwell Germany, NP  venlafaxine XR (EFFEXOR-XR) 37.5 MG 24 hr capsule Take 37.5 mg by mouth daily. 01/08/19  Yes [provider]  acetaminophen (TYLENOL 8 HOUR) 650 MG CR tablet Take 1 tablet (650 mg total) by mouth every 8 (eight) hours as needed. Patient not taking: Reported on 05/16/2019 07/08/18   Varney Biles, MD  ciprofloxacin (CIPRO) 250 MG tablet Take 1 tablet (250 mg total) by mouth 2 (two) times daily for 5 days. 05/28/19 06/02/19  Fatima Blank, MD  NARCAN 4 MG/0.1ML LIQD nasal spray kit Place 4 mg into the nose once as needed (severe opioid overdose).  02/24/19   [provider]  ondansetron (ZOFRAN) 4 MG tablet Take 1 tablet (4 mg total) by mouth every 6 (six) hours as needed for nausea. Patient not taking: Reported on 05/28/2019 11/19/18   Georgette Shell, MD  polyethylene glycol powder [facility]) powder Take 1  1/2 dose  daily in 12 ounces of fluid every day. Patient not taking: Reported on 05/28/2019 01/01/17   Esterwood, [name] S, PA-C  tiZANidine (ZANAFLEX) 4 MG capsule Take 1 capsule (4 mg total) by mouth 2 (two) times daily. Patient not taking: Reported on 05/28/2019 12/28/18   Rockwell Germany, NP                                                                                                                                    Past Surgical History Past Surgical History:  Procedure Laterality Date  . ABDOMINAL HYSTERECTOMY    . APPENDECTOMY    . BACLOFEN PUMP REFILL     x 3 times  . CARPAL TUNNEL RELEASE  08/2008   Dr Daylene Katayama  . CESAREAN SECTION  x 2  . CHOLECYSTECTOMY  10/14/2015   Procedure: LAPAROSCOPIC  CHOLECYSTECTOMY;  Surgeon: Georganna Skeans, MD;  Location: [facility];  Service: General;;  . COLPOSCOPY  06/2000  . INTRAUTERINE DEVICE INSERTION  04/30/11   Inserted by [place] for endometriosis  . LAPAROSCOPIC ASSISTED VAGINAL HYSTERECTOMY  10/27/2012  . MULTIPLE EXTRACTIONS WITH ALVEOLOPLASTY Bilateral 07/19/2013   Procedure: EXTRACTIONS #4, 4,81,85,63;  Surgeon: Isac Caddy, DDS;  Location: [facility];  Service: Oral Surgery;  Laterality: Bilateral;  . NEUROMA SURGERY Left    anterior and posterior intersosseous  . PAIN PUMP IMPLANTATION N/A 03/07/2014   Procedure: baclofen pump revision/replacement and Catheter connection replacement;  Surgeon: Ophelia Charter, MD;  Location: [place] NEURO ORS;  Service: Neurosurgery;  Laterality: N/A;  baclofen pump revision/replacement and Catheter connection replacement  . PROGRAMABLE BACLOFEN PUMP REVISION  03/17/14   Battery Replacement   . TUBAL LIGATION  2003  . URETHRA SURGERY    . WRIST SURGERY  06/2010   Dr Nicoletta Dress, hand surgeon, [facility]   Family History Family History  Problem Relation Age of Onset  . Asthma Father   . Colon cancer Maternal Grandmother        Died in her 47's  . Breast cancer Paternal Grandmother        Died in her 4's  . Heart attack Maternal Grandfather        Died in his 18's  . Alzheimer's disease Paternal Grandfather        Died in his 24's  . Breast cancer Mother   . Stomach cancer Neg Hx     Social History Social History   Tobacco Use  . Smoking status: Never Smoker  . Smokeless tobacco: Never Used  Substance Use Topics  . Alcohol use: Yes    Alcohol/week: 1.0 standard drinks    Types: 1 Glasses of wine per week    Comment: Drinks alcohol once a month.  . Drug use: No   Allergies Morphine  Review of Systems Review of Systems All other systems are reviewed and are negative for acute change except as noted in the HPI  Physical Exam Vital Signs  I have reviewed the triage vital signs BP 118/76    Pulse (!) 58   Temp 97.7 F (36.5 C) (Oral)   Resp 18   LMP 04/05/2011   SpO2 96%   Physical Exam Vitals signs reviewed.  Constitutional:      General: She is not in acute distress.    Appearance: She is well-developed. She is not diaphoretic.  HENT:     Head: Normocephalic and atraumatic.     Right Ear: External ear normal.     Left Ear: External ear normal.     Nose: Nose normal.  Eyes:     General: No scleral icterus.    Conjunctiva/sclera: Conjunctivae normal.  Neck:     Musculoskeletal: Normal range of motion.     Trachea: Phonation normal.  Cardiovascular:     Rate and Rhythm: Normal rate and regular rhythm.  Pulmonary:     Effort: Pulmonary effort is normal. No respiratory distress.     Breath sounds: No stridor.  Abdominal:     General: There is no distension.     Tenderness: There is abdominal tenderness in the suprapubic area. There is no guarding or rebound.    Musculoskeletal: Normal range of motion.  Neurological:     Mental Status: She is alert and oriented to person, place, and time.  Psychiatric:        Behavior: Behavior normal.     ED Results and Treatments Labs (all labs ordered are listed, but only abnormal results are displayed) Labs Reviewed  URINALYSIS, ROUTINE W REFLEX MICROSCOPIC - Abnormal; Notable for the following components:      Result Value   APPearance CLOUDY (*)    Hgb urine dipstick LARGE (*)    Protein, ur 100 (*)    Leukocytes,Ua LARGE (*)    RBC / HPF >50 (*)    WBC, UA >50 (*)    Bacteria, UA RARE (*)    All other components within normal limits  CBC WITH DIFFERENTIAL/PLATELET - Abnormal; Notable for the following components:   Hemoglobin 11.7 (*)    All other components within normal limits  BASIC METABOLIC PANEL                                                                                                                         EKG  EKG Interpretation  Date/Time:    Ventricular Rate:    PR Interval:    QRS  Duration:   QT Interval:    QTC Calculation:   R Axis:     Text Interpretation:        Radiology No results found.  Pertinent labs & imaging results that were available during my care of the patient were reviewed by me and considered in my medical decision making (see chart for details).  Medications Ordered in ED Medications  fentaNYL (SUBLIMAZE) injection 50 mcg (50 mcg Intravenous Given 05/28/19 1831)  acetaminophen (TYLENOL) tablet 1,000 mg (1,000 mg Oral Given 05/28/19 1831)  ciprofloxacin (CIPRO) IVPB 400 mg (0 mg Intravenous Stopped 05/28/19 1934)  fentaNYL (SUBLIMAZE) injection 50 mcg (50 mcg Intravenous Given 05/28/19 2125)                                                                                                                                    Procedures Procedures  (including critical care time)  Medical Decision Making / ED Course I have reviewed the nursing notes for this encounter and the patient's prior records (if available in EHR or on provided paperwork).  Urine continues to be infected.  Given the fact that Pseudomonas and Klebsiella are new bacteria for the patient's and they were nearly pansensitive, feel patient would benefit from short course of ciprofloxacin.  Screening labs reassuring  without leukocytosis.  The patient is safe for discharge with strict return precautions.       Final Clinical Impression(s) / ED Diagnoses Final diagnoses:  Acute cystitis with hematuria     The patient appears reasonably screened and/or stabilized for discharge and I doubt any other medical condition or other [facility] requiring further screening, evaluation, or treatment in the ED at this time prior to discharge.  Disposition: Discharge  Condition: Good  I have discussed the results, Dx and Tx plan with the patient who expressed understanding and agree(s) with the plan. Discharge instructions discussed at great length. The patient was given strict return  precautions who verbalized understanding of the instructions. No further questions at time of discharge.    ED Discharge Orders         Ordered    ciprofloxacin (CIPRO) 250 MG tablet  2 times daily     05/28/19 2143          This chart was dictated using voice recognition software.  Despite best efforts to proofread,  errors can occur which can change the documentation meaning.   Fatima Blank, MD 05/28/19 2237

## 2019-05-29 LAB — URINE CULTURE: Culture: >=100000 — AB

## 2019-05-30 ENCOUNTER — Telehealth: Payer: Self-pay | Admitting: Emergency Medicine

## 2019-05-30 NOTE — Telephone Encounter (Signed)
Post ED Visit - Positive Culture Follow-up  Culture report reviewed by antimicrobial stewardship pharmacist: [facility] Team []  Elenor Quinones, Pharm.D. []  Heide Guile, Pharm.D., BCPS AQ-ID []  Parks Neptune, Pharm.D., BCPS []  Alycia Rossetti, Pharm.D., BCPS []  Canal F[place]on, Pharm.D., BCPS, AAHIVP []  Legrand Como, Pharm.D., BCPS, AAHIVP [x]  Salome Arnt, PharmD, BCPS []  Johnnette Gourd, PharmD, BCPS []  Hughes Better, PharmD, BCPS []  Leeroy Cha, PharmD []  Laqueta Linden, PharmD, BCPS []  Albertina Parr, PharmD  [place] Team []  Leodis Sias, PharmD []  Lindell Spar, PharmD []  Royetta Asal, PharmD []  Graylin Shiver, Rph []  Rema Fendt) Glennon Mac, PharmD []  Arlyn Dunning, PharmD []  Netta Cedars, PharmD []  Dia Sitter, PharmD []  Leone Haven, PharmD []  Gretta Arab, PharmD []  Theodis Shove, PharmD []  Peggyann Juba, PharmD []  Reuel Boom, PharmD   Positive urine culture Treated with Cipro, organism sensitive to the same and no further patient follow-up is required at this time.  [name] [name] 05/30/2019, 1:38 PM

## 2019-05-31 LAB — CULTURE, BLOOD (ROUTINE X 2)
Culture: NO GROWTH
Culture: NO GROWTH
Special Requests: ADEQUATE

## 2019-06-02 ENCOUNTER — Telehealth (INDEPENDENT_AMBULATORY_CARE_PROVIDER_SITE_OTHER): Payer: Self-pay | Admitting: Pediatrics

## 2019-06-02 NOTE — Telephone Encounter (Signed)
Due to not being able to understand patient, I informed Otila Kluver that she called. Per Otila Kluver, she will after her 11:30

## 2019-06-02 NOTE — Telephone Encounter (Signed)
I called [name] [name] and she has questions about her medication and times of administration. She asked that I call her back at 4pm so that I can speak with her caregiver, which I will do. TG

## 2019-06-02 NOTE — Telephone Encounter (Signed)
I called [name] [name] back and spoke with her aide. She said that [name] [name] was complaining of being sleepy in the mornings at her job and wanted to know if doses of her medication could change. She takes all meds at 8 AM & 8 PM and are bubble packed by the pharmacy. The aide is not permitted to make changes to the bubble packs. In addition, the aide reports that [name]'s PCP has taken over prescribing/refilling the medications. I told [name] [name] and her aide that they will need to talk to the PCP about changes in the bubble packed medications. TG

## 2019-06-02 NOTE — Telephone Encounter (Signed)
Who's calling (name and relationship to patient) : [name] (self)  Best contact number: [phone]  Provider they see: Dr. Gaynell Face  Reason for call:  [name] [name] called in stating she has some questions about her medications that she is needing to talk to Dr. Gaynell Face, she has been having bad muscle spasams. States she needs to talk to him today. Please advise.   Call ID:      PRESCRIPTION REFILL ONLY  Name of prescription:  Pharmacy:

## 2019-06-09 ENCOUNTER — Telehealth: Payer: Self-pay | Admitting: Family Medicine

## 2019-06-09 DIAGNOSIS — L89302 Pressure ulcer of unspecified buttock, stage 2: Secondary | ICD-10-CM

## 2019-06-09 DIAGNOSIS — G808 Other cerebral palsy: Secondary | ICD-10-CM

## 2019-06-09 NOTE — Telephone Encounter (Signed)
Copied from [place] [id number]. Topic: Referral - Request for Referral >> Jun 08, 2019  5:03 PM Yvette Rack wrote: Has patient seen PCP for this complaint? yes  *If NO, is insurance requiring patient see PCP for this issue before PCP can refer them? Referral for which specialty: Pt request referral for occupational therapy, physical therapy and wound care outside the home. Pt was advised she did not qualify for in home wound care  Preferred provider/office: no specific provider Reason for referral:

## 2019-06-09 NOTE — Telephone Encounter (Signed)
Called patient to schedule a hospital f/u. Mailbox was full so couldn't leave a VM

## 2019-06-11 NOTE — Telephone Encounter (Signed)
Called patient and she has an appointment on 06/14/2019. She gets around by [employer] She said that she does not qualify for home health.  Will consult with a social worker to assist this patient.

## 2019-06-14 ENCOUNTER — Other Ambulatory Visit: Payer: Self-pay

## 2019-06-14 ENCOUNTER — Encounter: Payer: Self-pay | Admitting: Family Medicine

## 2019-06-14 ENCOUNTER — Ambulatory Visit (INDEPENDENT_AMBULATORY_CARE_PROVIDER_SITE_OTHER): Payer: Medicare Other | Admitting: Family Medicine

## 2019-06-14 VITALS — BP 92/63 | HR 84 | Temp 97.7°F | Resp 18

## 2019-06-14 DIAGNOSIS — L89302 Pressure ulcer of unspecified buttock, stage 2: Secondary | ICD-10-CM

## 2019-06-14 DIAGNOSIS — I10 Essential (primary) hypertension: Secondary | ICD-10-CM | POA: Diagnosis not present

## 2019-06-14 DIAGNOSIS — G808 Other cerebral palsy: Secondary | ICD-10-CM | POA: Diagnosis not present

## 2019-06-14 NOTE — Patient Instructions (Signed)
° ° ° °  If you have lab work done today you will be contacted with your lab results within the next 2 weeks.  If you have not heard from us then please contact us. The fastest way to get your results is to register for My Chart. ° ° °IF you received an x-ray today, you will receive an invoice from [facility]. Please contact [facility] at [phone] with questions or concerns regarding your invoice.  ° °IF you received labwork today, you will receive an invoice from [facility]. Please contact [facility] at [phone] with questions or concerns regarding your invoice.  ° °Our billing staff will not be able to assist you with questions regarding bills from these companies. ° °You will be contacted with the lab results as soon as they are available. The fastest way to get your results is to activate your My Chart account. Instructions are located on the last page of this paperwork. If you have not heard from us regarding the results in 2 weeks, please contact this office. °  ° ° ° °

## 2019-06-14 NOTE — Progress Notes (Signed)
Established Patient Office Visit  Subjective:  Patient ID: [name] [name], female    DOB: [birth year]  Age: 43 y.o. MRN: [id number]  CC:  Chief Complaint  Patient presents with  . referrals    patient wants referrals for wound care for her bottom, PT, OT, [place]  and an Eye doctor  . 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  Mita communicates for herself but is hard to understand so the history was given by [name] [name] and her caregiver would clarify what [name] [name] was saying.  The patient reports that her insurance is not covering home health services since she leaves the house and goes to work.  She states that she needs referrals for all her specialists. Today she needs referrals for the Ophthalmologist and to wound clinic for her chronic decubitus ulcers.  She reports that she has muscle spasms related to her Cerebral Palsy and her quadriplegia.  She would like to get either increase her in muscle relaxer or something to help her at work but that won't cause her to also have feel very sleepy at work.  She would like an order to allow her to self-administer meds.  She states that Angie Fava could leave out the tablets on the counter and she could pick them up and take some of the as needed meds and she could try to do the same thing at her father's house.  She is also enquiring about alcohol for the second time.      Past Medical History:  Diagnosis Date  . Acute respiratory failure with hypoxia ([place])   . Anemia   . Cellulitis 12/22/2014  . Cerebral palsy ([name])    Spastic Cerebral palsy, mentally intact  . Contracture, joint, multiple sites    Electric wheelchair, uses left hand to operate chair.   . Depression   . [name]   . Dysarthria   . GERD (gastroesophageal reflux disease)   .  Gram positive sepsis ([place]) 10/24/2014  . HCAP (healthcare-associated pneumonia)    2015, 2018  . History of endometriosis 10/2012   OBGYN [facility]: Laparoscopic Endometrial Ablation, TVH,  . History of recurrent UTIs   . Hypertension   . Incontinent of feces 03/11/2016  . Intracervical pessary 05/07/2011   Overview:  Overview:  Placed by [facility]YN 04/30/11 to treat DUB and Endometriosis.  She is seen at GYN clinic at [facility] but was considered a poor surgical candidate and referred her to [facility].  For DUB she had pelvic ultrasound that showed thin stripe and she had endometrial Bx by Dr Nori Riis in [place] clinic, which was negative.     . Macroscopic hematuria 03/10/2013   status post replacement of suprapubic tube 03/04/2013   . Migraine   . OCD (obsessive compulsive disorder)   . Parapneumonic effusion 10/25/2014  . Presence of intrathecal baclofen pump 03/07/2014   R LQ. Insertion T10.    [name] Kitchen Psychiatric illness 10/31/2014  . Recurrent pulmonary embolism ([name])    Pt has history of recurrent PE and is on lifelong coumadin.   . Seizures ([place]) 02/12/2016  . Spasticity 01/05/2014  . Transient alteration of awareness, recurrent 09/08/2006   Recurrent episodes where Letica  loses contact with her world and that have been studied thoroughly and appear nonepileptic in nature  per Dr Krista Blue (neurology) The patient has had episodes starting in 2007 that initially began 1-4 times a day during which time she would have periods of unawareness followed by confusion. This continuous video EEG monitoring performed from October 8-12, 2007. Had   . Urethra dilated and patulous 07/03/2015   Patulous, Dilated Urethra. 44m balloon on a 22-Fr catheter hoping this would prevent the bladder spasm from pushing the balloon down the urethra (Dr EAmalia Hailey [facility], July 2016)     Past Surgical History:  Procedure Laterality Date  . ABDOMINAL HYSTERECTOMY    . APPENDECTOMY    . BACLOFEN PUMP REFILL     x 3 times  . CARPAL TUNNEL  RELEASE  08/2008   Dr SDaylene Katayama . CESAREAN SECTION     x 2  . CHOLECYSTECTOMY  10/14/2015   Procedure: LAPAROSCOPIC CHOLECYSTECTOMY;  Surgeon: BGeorganna Skeans MD;  [facility]  Service: General;;  . COLPOSCOPY  06/2000  . INTRAUTERINE DEVICE INSERTION  04/30/11   [facility]  . LAPAROSCOPIC ASSISTED VAGINAL HYSTERECTOMY  10/27/2012  . MULTIPLE EXTRACTIONS WITH ALVEOLOPLASTY Bilateral 07/19/2013   Procedure: EXTRACTIONS #4, 53,00,51,10  Surgeon: CIsac Caddy DDS;  [facility]  Service: Oral Surgery;  Laterality: Bilateral;  . NEUROMA SURGERY Left    anterior and posterior intersosseous  . PAIN PUMP IMPLANTATION N/A 03/07/2014   Procedure: baclofen pump revision/replacement and Catheter connection replacement;  Surgeon: JOphelia Charter MD;  [facility];  Service: Neurosurgery;  Laterality: N/A;  baclofen pump revision/replacement and Catheter connection replacement  . PROGRAMABLE BACLOFEN PUMP REVISION  03/17/14   Battery Replacement   . TUBAL LIGATION  2003  . URETHRA SURGERY    . WRIST SURGERY  06/2010   [facility]   Family History  Problem Relation Age of Onset  . Asthma Father   . Colon cancer Maternal Grandmother        Died in her 623's . Breast cancer Paternal Grandmother        Died in her 71's . Heart attack Maternal Grandfather        Died in his 774's . Alzheimer's disease Paternal Grandfather        Died in his 880's . Breast cancer Mother   . Stomach cancer Neg Hx     Social History   Socioeconomic History  . Marital status: Divorced    Spouse name: Not on file  . Number of children: 2  . Years of education: college  . Highest education level: Not on file  Occupational History    Employer: UNEMPLOYED  Social Needs  . Financial resource strain: Not on file  . Food insecurity    Worry: Not on file    Inability: Not on file  . Transportation needs    Medical: Not on file     Non-medical: Not on file  Tobacco Use  . Smoking status: Never Smoker  . Smokeless tobacco: Never Used  Substance and Sexual Activity  . Alcohol use: Yes    Alcohol/week: 1.0 standard drinks    Types: 1 Glasses of wine per week    Comment: Drinks alcohol once a month.  . Drug use: No  . Sexual activity: Never  Lifestyle  . Physical activity    Days per week: Not on file    Minutes per session: Not on file  . Stress: Not on file  Relationships  .  Social Herbalist on phone: Not on file    Gets together: Not on file    Attends religious service: Not on file    Active member of club or organization: Not on file    Attends meetings of clubs or organizations: Not on file    Relationship status: Not on file  . Intimate partner violence    Fear of current or ex partner: Not on file    Emotionally abused: Not on file    Physically abused: Not on file    Forced sexual activity: Not on file  Other Topics Concern  . 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    She enjoys shopping.   Lives in a [facility] since 02/11/16      Delrae Sawyers, who has schizophrenia, MR, is father of daughter.  Pilar Plate and pt are no longer together.  Pilar Plate does not see Cleotis Lema. Daughter is 4 yo, Nat Math, lives in a group home in [place].    [name]'s mother reportedly has breast cancer.    Needs assistance with ADL's and IADL's--has personal care services 20 hrs/wk;       No tobacco, EtOH, drugs.      **Case Manager: Maia Petties [phone]      Patient lives [facility]    Outpatient Medications Prior to Visit  Medication Sig Dispense Refill  . acetaminophen (TYLENOL 8 HOUR) 650 MG CR tablet Take 1 tablet (650 mg total) by mouth every 8 (eight) hours as needed. 30 tablet 0  . acetaminophen (TYLENOL) 500 MG tablet Take 1,000 mg by mouth 2 (two) times daily as  needed for headache (pain).    [name] Kitchen amantadine (SYMMETREL) 100 MG capsule Take 100 mg by mouth daily.     [name] Kitchen azelastine (ASTELIN) 0.1 % nasal spray Place 2 sprays into both nostrils 2 (two) times daily. Use in each nostril as directed    . baclofen (LIORESAL) 20 MG tablet Take 1 tablet (20 mg total) by mouth 4 (four) times daily. For bladder spasms 30 each 2  . bisacodyl (DULCOLAX) 5 MG EC tablet Take 15 mg by mouth daily as needed (constipation).    . Buprenorphine HCl (BELBUCA) 900 MCG FILM Place 900 mcg inside cheek every 12 (twelve) hours.    . busPIRone (BUSPAR) 5 MG tablet Take 5 mg by mouth 2 (two) times daily.     . clonazePAM (KLONOPIN) 0.5 MG tablet Take 1.5 tablets (0.75 mg total) by mouth 3 (three) times daily. 15 tablet 0  . feeding supplement, ENSURE ENLIVE, (ENSURE ENLIVE) LIQD Take 237 mLs by mouth 2 (two) times daily between meals. 237 mL 0  . FLUoxetine (PROZAC) 40 MG capsule Take 80 mg by mouth daily.    [name] Kitchen guaiFENesin (MUCINEX) 600 MG 12 hr tablet Take 600 mg by mouth 2 (two) times daily as needed for cough or to loosen phlegm.     . hydroxychloroquine (PLAQUENIL) 200 MG tablet Take 200 mg by mouth daily.    [name] Kitchen lamoTRIgine (LAMICTAL) 100 MG tablet Take 100 mg by mouth at bedtime.    [name] Kitchen linaclotide (LINZESS) 145 MCG CAPS capsule Take 145 mcg by mouth daily before breakfast.     . Melatonin 5 MG TABS Take 5 mg by mouth at bedtime.     . naloxegol oxalate (MOVANTIK) 25 MG TABS tablet Take 25 mg by mouth daily.    [name] Kitchen  NARCAN 4 MG/0.1ML LIQD nasal spray kit Place 4 mg into the [name] once as needed (severe opioid overdose).     [name] Kitchen omeprazole (PRILOSEC) 20 MG capsule Take 1 capsule (20 mg total) by mouth daily. 30 capsule 3  . ondansetron (ZOFRAN) 4 MG tablet Take 1 tablet (4 mg total) by mouth every 6 (six) hours as needed for nausea. 20 tablet 0  . oxybutynin (DITROPAN) 5 MG tablet Take 5 mg by mouth 4 (four) times daily.     . Oxycodone HCl 10 MG TABS Take 10 mg by mouth every 6 (six) hours  as needed (pain).    . polyethylene glycol powder (GLYCOLAX/MIRALAX) powder Take 1  1/2 dose  daily in 12 ounces of fluid every day. 255 g 3  . QUEtiapine (SEROQUEL) 50 MG tablet Take 150 mg by mouth at bedtime.     . rivaroxaban (XARELTO) 10 MG TABS tablet Take 10 mg by mouth daily.     . Skin Protectants, Misc. (EUCERIN) cream Apply topically as needed for wound care. (Patient taking differently: Apply 1 application topically daily as needed for wound care. ) 397 g 0  . tiZANidine (ZANAFLEX) 4 MG capsule Take 1 capsule (4 mg total) by mouth 2 (two) times daily. 60 capsule 2  . topiramate (TOPAMAX) 100 MG tablet Take 1 tablet at bedtime (Patient taking differently: Take 100 mg by mouth at bedtime. ) 30 tablet 5  . venlafaxine XR (EFFEXOR-XR) 37.5 MG 24 hr capsule Take 37.5 mg by mouth daily.     No facility-administered medications prior to visit.     Allergies  Allergen Reactions  . Morphine Dermatitis and Other (See Comments)    Skin turned red     ROS Review of Systems    Objective:    Physical Exam  BP 92/63 (BP Location: Left Arm, Patient Position: Sitting, Cuff Size: Normal)   Pulse 84   Temp 97.7 F (36.5 C) (Oral)   Resp 18   LMP 04/05/2011   SpO2 98%  Wt Readings from Last 3 Encounters:  02/17/19 101 lb 13.6 oz (46.2 kg)  01/30/19 103 lb 9.9 oz (47 kg)  01/13/19 103 lb 9.9 oz (47 kg)    Small female sitting in motorized wheel chair Facial muscles weak so it hard to understand the patient She has normal sinus rhythm Normal pulmonary effort, no wheezing, no rhonchi Normoactive bs, soft, nontender Extremities thin   Health Maintenance Due  Topic Date Due  . PAP SMEAR-Modifier  11/12/2013    There are no preventive care reminders to display for this patient.  Lab Results  Component Value Date   TSH 0.710 06/12/2015   Lab Results  Component Value Date   WBC 5.9 05/28/2019   HGB 11.7 (L) 05/28/2019   HCT 37.7 05/28/2019   MCV 96.9 05/28/2019   PLT  260 05/28/2019   Lab Results  Component Value Date   NA 141 05/28/2019   K 3.9 05/28/2019   CO2 25 05/28/2019   GLUCOSE 93 05/28/2019   BUN 10 05/28/2019   CREATININE 0.53 05/28/2019   BILITOT 0.4 05/26/2019   ALKPHOS 77 05/26/2019   AST 16 05/26/2019   ALT 13 05/26/2019   PROT 6.3 (L) 05/26/2019   ALBUMIN 3.6 05/26/2019   CALCIUM 9.0 05/28/2019   ANIONGAP 10 05/28/2019   Lab Results  Component Value Date   CHOL 150 01/29/2007   Lab Results  Component Value Date   HDL 41 01/29/2007   Lab  Results  Component Value Date   LDLCALC 92 01/29/2007   Lab Results  Component Value Date   TRIG 83 01/29/2007   Lab Results  Component Value Date   CHOLHDL 3.7 Ratio 01/29/2007   Lab Results  Component Value Date   HGBA1C 6.0 07/25/2009      Assessment & Plan:   Problem List Items Addressed This Visit      Cardiovascular and Mediastinum   HYPERTENSION, BENIGN (Chronic)   Relevant Orders   Ambulatory referral to Ophthalmology   AMB referral to wound care center     Nervous and Auditory   Cerebral palsy, quadriplegic ([place]) - Primary (Chronic)   Relevant Orders   Ambulatory referral to Ophthalmology   AMB referral to wound care center      No orders of the defined types were placed in this encounter.  A total of 40 minutes were spent face-to-face with the patient during this encounter and over half of that time was spent on counseling and coordination of care.  Follow-up: No follow-ups on file.    Forrest Moron, MD

## 2019-06-16 ENCOUNTER — Telehealth: Payer: Self-pay | Admitting: Family Medicine

## 2019-06-16 NOTE — Telephone Encounter (Signed)
Copied from [place] [id number]. Topic: General - Other >> Jun 16, 2019 11:23 AM Antonieta Iba C wrote: Reason for CRM: pt's care taker, Angie Fava called in to follow up on the form. She said that pt had an apt the other day with Dr. [name].   Caretaker says that on the Memory section of form-- she would like to make provider aware that pt has a time remembering certain things for example:  Dr. Thomasene Lot, taking her medication. Also when pt brush her teeth, pt don't remember if she brushed    On Dates of her Dx portion of the form: Caretaker says that it has to have the actual date that pt was Dx,  currently has at birth   Also, would like to know if pt has been checked for diabetes ? She said that pt informed her this is a concern that she has because diabetes runs in her family.   Please assist   CB: Angie Fava, caretaker

## 2019-06-18 ENCOUNTER — Telehealth (INDEPENDENT_AMBULATORY_CARE_PROVIDER_SITE_OTHER): Payer: Self-pay | Admitting: Pediatrics

## 2019-06-18 NOTE — Telephone Encounter (Signed)
I was unable to reach [name] [name].  I would recommend decreasing tizanidine during the day.  She has been on this medication a long time and is on significant polypharmacy.  I do not think that there is a good way to deal with this issue.  I will speak with Otila Kluver about the situation.

## 2019-06-18 NOTE — Telephone Encounter (Signed)
Pt's caregiver calling to f/up on this msg and to request that form be faxed to:  [facility] [phone]  And to [facility] [phone] Attn: Carl Best

## 2019-06-18 NOTE — Telephone Encounter (Signed)
Pt's caregiver Angie Fava would also like a call back to discuss updates.  Please call: [phone].

## 2019-06-18 NOTE — Telephone Encounter (Signed)
Who's calling (name and relationship to patient) : Self  Best contact number: [phone]  Provider they see: Dr. Gaynell Face  Reason for call:  [name] [name] called in with the help of her daughter, wanted to rely to Dr. Gaynell Face that her tizandine is making her too sleepy. Wants to know if Dr. Gaynell Face has any suggestions for this. Please advise   Call ID:      PRESCRIPTION REFILL ONLY  Name of prescription:  Pharmacy:

## 2019-06-21 NOTE — Telephone Encounter (Signed)
Thank you :)

## 2019-06-21 NOTE — Telephone Encounter (Signed)
Caller name: Angie Fava [phone]   Reason for call:  Care giver checking on the status of forms, please advise 4x request

## 2019-06-21 NOTE — Telephone Encounter (Signed)
Pts caregiver called stating she would like update on forms. Please advise.

## 2019-06-21 NOTE — Telephone Encounter (Signed)
[name] [name] called in and complained of being sleepy during the day. She said that she had slept almost all day for the past 4 days. She could not tell me exactly how she was taking her medication. I asked her to come in tomorrow at 2pm to be evaluated and to go over her medications. I asked her to bring her medications in with her for my review. She agreed with this plan. TG

## 2019-06-22 ENCOUNTER — Encounter (INDEPENDENT_AMBULATORY_CARE_PROVIDER_SITE_OTHER): Payer: Self-pay | Admitting: Family

## 2019-06-22 ENCOUNTER — Other Ambulatory Visit: Payer: Self-pay

## 2019-06-22 ENCOUNTER — Ambulatory Visit (INDEPENDENT_AMBULATORY_CARE_PROVIDER_SITE_OTHER): Payer: Medicare Other | Admitting: Family

## 2019-06-22 VITALS — BP 110/90 | HR 76 | Wt <= 1120 oz

## 2019-06-22 DIAGNOSIS — F411 Generalized anxiety disorder: Secondary | ICD-10-CM | POA: Diagnosis not present

## 2019-06-22 DIAGNOSIS — G43009 Migraine without aura, not intractable, without status migrainosus: Secondary | ICD-10-CM | POA: Diagnosis not present

## 2019-06-22 DIAGNOSIS — R634 Abnormal weight loss: Secondary | ICD-10-CM

## 2019-06-22 DIAGNOSIS — G249 Dystonia, unspecified: Secondary | ICD-10-CM | POA: Diagnosis not present

## 2019-06-22 DIAGNOSIS — G801 Spastic diplegic cerebral palsy: Secondary | ICD-10-CM

## 2019-06-22 DIAGNOSIS — R252 Cramp and spasm: Secondary | ICD-10-CM

## 2019-06-22 DIAGNOSIS — R471 Dysarthria and anarthria: Secondary | ICD-10-CM

## 2019-06-22 NOTE — Progress Notes (Signed)
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 She has an implanted intrathecal pump to deliver Baclofen that is managed by Dr Gaynell Face. She is taking and tolerating Topiramate for migraine prevention.   Today's concerns:  [name] [name] is seen today on urgent basis because she called yesterday to report excessive daytime sleepiness. Today she tells me that she takes her medications at 9AM and 9PM, and that she is very sleepy after the morning dose of Tizanidine. She said that when she lived at a nursing facility the dose was given at 4:30AM and that she went back to sleep afterwards for awhile. [name] [name] says that she talked about this problem with her PCP, who did not want to make changes in her medications. 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 In taking with [name] [name] and there aide with her today, [name] [name] works about 4 hours per day, doing one task for the entire time. [name] [name] also reports being sleepy if she watches TV at home or if she is sitting in her wheelchair undisturbed for any length of time.  When she is engaged in an activity or talking with people, she is not sleepy.   [name] [name] has been treated at the hospital in the last few weeks for recurrent urinary tract infections. She also tells me that she has a pressure ulcer on her buttock. [name] [name] has lost weight over time and her aide says that she is supposed to be drinking 2 Boost supplements per day, but sometimes refuses to do so.   [name] [name] and her aide report today that she has been  threatened by an agency that is overseeing the care of her daughter if she does not answer her telephone promptly. She has difficulty with fine motor movemetns and is unable to manipulate the phone quickly enough at times to accept the call before it stops ringing. [name] [name] is concerned that "they will take her (daughter)" if she is unable to answer her phone rapidly.   [name] [name] has been otherwise generally healthy since she was last seen and has no other health concerns today other than previously mentioned.   Review of systems: Please see HPI for neurologic and other pertinent review of systems. Otherwise all other systems were reviewed and were negative.  Problem List: Patient Active Problem List   Diagnosis Date Noted  . Pressure injury of buttock, stage 2 ([place]) 04/20/2019  . Palliative care encounter   . Cough 01/04/2019  . Bad taste in mouth 01/04/2019  . Hypotension 11/19/2018  . Urinary tract infection associated with indwelling urethral catheter ([place]) 11/18/2018  . Solitary pulmonary nodule on lung CT 10/28/2018  . Connective tissue disorder ([place]) 08/07/2018  . Migraine without aura and without status migrainosus, not intractable 06/26/2018  . UTI (urinary tract infection) 05/25/2018  . Headache 05/25/2018  . Incontinent of feces 03/11/2016  . Chronic anticoagulation 03/11/2016  . Involuntary movements 02/22/2016  . Seizures ([place]) 02/12/2016  . Dyspnea 02/12/2016  . Cerebral palsy, quadriplegic ([place]) 02/06/2016  . Swelling of extremity 01/13/2016  . Nausea without vomiting 11/09/2015  . Fever   .  Pressure ulcer 10/09/2015  . Status post insertion of inferior vena caval filter 09/28/2015  . Absence of bladder continence 08/25/2015  . Person living in residential institution 06/14/2015  . Pulmonary hypertension ([name])   . Anemia of chronic disease   . Spasticity 03/15/2015  . Constipation, chronic 10/10/2014  . Athetoid cerebral palsy ([name]) 04/13/2014  . Dystonia 04/12/2014  .  UTI (lower urinary tract infection) 03/16/2014  . Presence of intrathecal baclofen pump 03/07/2014  . Dental caries 07/18/2013  . Multiple thyroid nodules 05/20/2013  . Osteoarthrosis 04/07/2013  . [name]   . Dysarthria   . GERD (gastroesophageal reflux disease)   . Neurogenic bladder 02/05/2013  . Anxiety state 10/21/2012  . Chronic pain syndrome 06/17/2012  . Recurrent pulmonary embolism ([name]) 05/03/2011  . Pulmonary embolism with infarction ([name]) 05/03/2011  . Chronic suprapubic catheter ([name]) 03/15/2011  . Disease of female genital organs 10/24/2010  . HYPERTENSION, BENIGN 01/31/2010  . CP (cerebral palsy), spastic ([name]) 12/27/2008  . Contracted, joint, multiple sites 12/27/2008  . Major depressive disorder, recurrent episode ([name]) 01/29/2007  . Obsessive Compulsive Disorder 01/29/2007  . Transient alteration of awareness, recurrent 09/08/2006     Past Medical History:  Diagnosis Date  . Acute respiratory failure with hypoxia ([name])   . Anemia   . Cellulitis 12/22/2014  . Cerebral palsy ([name])    Spastic Cerebral palsy, mentally intact  . Contracture, joint, multiple sites    Electric wheelchair, uses left hand to operate chair.   . Depression   . [name]   . Dysarthria   . GERD (gastroesophageal reflux disease)   . Gram positive sepsis ([name]) 10/24/2014  . HCAP (healthcare-associated pneumonia)    2015, 2018  . History of endometriosis 10/2012   OBGYN [facility]: Laparoscopic Endometrial Ablation, TVH,  . History of recurrent UTIs   . Hypertension   . Incontinent of feces 03/11/2016  . Intracervical pessary 05/07/2011   Overview:  Overview:  Placed by [facility] 04/30/11 to treat DUB and Endometriosis.  She is seen at GYN clinic at [facility] but was considered a poor surgical candidate and referred her to [facility].  For DUB she had pelvic ultrasound that showed thin stripe and she had endometrial Bx by Dr Neal in GYN clinic,  which was negative.     . Macroscopic hematuria 03/10/2013   status post replacement of suprapubic tube 03/04/2013   . Migraine   . OCD (obsessive compulsive disorder)   . Parapneumonic effusion 10/25/2014  . Presence of intrathecal baclofen pump 03/07/2014   R LQ. Insertion T10.    . Psychiatric illness 10/31/2014  . Recurrent pulmonary embolism ([name])    Pt has history of recurrent PE and is on lifelong coumadin.   . Seizures ([name]) 02/12/2016  . Spasticity 01/05/2014  . Transient alteration of awareness, recurrent 09/08/2006   Recurrent episodes where Damariz  loses contact with her world and that have been studied thoroughly and appear nonepileptic in nature per Dr Yan (neurology) The patient has had episodes starting in 2007 that initially began 1-4 times a day during which time she would have periods of unawareness followed by confusion. This continuous video EEG monitoring performed from October 8-12, 2007. Had   . Urethra dilated and patulous 07/03/2015   Patulous, Dilated Urethra. 5mL balloon on a 22-Fr catheter hoping this would prevent the bladder spasm from pushing the balloon down the urethra (Dr Evans, Urology [facility], July 2016)       Past medical history comments: See HPI Copied from previous record: Birth History Extreme prematurity  Behavior History Anxiety and depression  Surgical history: Past Surgical History:  Procedure Laterality Date  . ABDOMINAL HYSTERECTOMY    . APPENDECTOMY    . BACLOFEN PUMP REFILL     x 3 times  . CARPAL TUNNEL RELEASE  08/2008   Dr Sypher  . CESAREAN SECTION     x 2  . CHOLECYSTECTOMY  10/14/2015   Procedure: LAPAROSCOPIC CHOLECYSTECTOMY;  Surgeon: Burke Thompson, MD;  Location: [facility] OR;  Service: General;;  . COLPOSCOPY  06/2000  . INTRAUTERINE DEVICE INSERTION  04/30/11   Inserted by [facility] for endometriosis  . LAPAROSCOPIC ASSISTED VAGINAL HYSTERECTOMY  10/27/2012  . MULTIPLE EXTRACTIONS WITH ALVEOLOPLASTY Bilateral 07/19/2013    Procedure: EXTRACTIONS #4, 5,11,16,19;  Surgeon: Christopher L Cudahy, DDS;  Location: [facility] OR;  Service: Oral Surgery;  Laterality: Bilateral;  . NEUROMA SURGERY Left    anterior and posterior intersosseous  . PAIN PUMP IMPLANTATION N/A 03/07/2014   Procedure: baclofen pump revision/replacement and Catheter connection replacement;  Surgeon: Jeffrey D Jenkins, MD;  Location: [facility]UR[facility] OR[facility];  Service: Neurosurgery;  Laterality: N/A;  baclofen pump revision/replacement and Catheter connection replacement  . PROGRAMABLE BACLOFEN PUMP REVISION  03/17/14   Battery Replacement   . TUBAL LIGATION  2003  . URETHRA SURGERY    . WRIST SURGERY  06/2010   Dr Li, hand surgeon, [facility]     Family history: family history includes Alzheimer's disease in her paternal grandfather; Asthma in her father; Breast cancer in her mother and paternal grandmother; Colon cancer in her maternal grandmother; Heart attack in her maternal grandfather.   Social history: Social History   Socioeconomic History  . Marital status: Divorced    Spouse name: Not on file  . Number of children: 2  . Years of education: college  . Highest education level: Not on file  Occupational History    Employer: UNEMPLOYED  Social Needs  . Financial resource strain: Not on file  . Food insecurity    Worry: Not on file    Inability: Not on file  . Transportation needs    Medical: Not on file    Non-medical: Not on file  Tobacco Use  . Smoking status: Never Smoker  . Smokeless tobacco: Never Used  Substance and Sexual Activity  . Alcohol use: Yes    Alcohol/week: 1.0 standard drinks    Types: 1 Glasses of wine per week    Comment: Drinks alcohol once a month.  . Drug use: No  . Sexual activity: Never  Lifestyle  . Physical activity    Days per week: Not on file    Minutes per session: Not on file  . Stress: Not on file  Relationships  . Social connections    Talks on phone: Not on file    Gets together: Not on file    Attends  religious service: Not on file    Active member of club or organization: Not on file    Attends meetings of clubs or organizations: Not on file    Relationship status: Not on file  . Intimate partner violence    Fear of current or ex partner: Not on file    Emotionally abused: Not on file    Physically abused: Not on file    Forced sexual activity: Not on file  Other Topics Concern  . 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    She enjoys shopping.   Lives in a [facility] since 02/11/16      Delrae Sawyers, who has schizophrenia, MR, is father of daughter.  Pilar Plate and pt are no longer together.  Pilar Plate does not see Cleotis Lema. Daughter is 23 yo, Nat Math, lives in a group home in [place].    [name]'s mother reportedly has breast cancer.    Needs assistance with ADL's and IADL's--has personal care services 20 hrs/wk;       No tobacco, EtOH, drugs.      **Case Manager: Maia Petties [phone]      Patient lives [facility]     Allergies: Allergies  Allergen Reactions  . Morphine Dermatitis and Other (See Comments)    Skin turned red       Immunizations: Immunization History  Administered Date(s) Administered  . H1N1 11/04/2008  . Influenza Whole 11/09/2008, 10/05/2009  . Influenza,inj,Quad PF,6+ Mos 10/24/2014, 12/14/2018  . Influenza-Unspecified 09/13/2015  . Pneumococcal Polysaccharide-23 10/24/2014, 10/01/2015  . Td 12/02/1994, 03/18/2008     Physical Exam: BP 110/90   Pulse 76   Wt 52 lb 3.2 oz (23.7 kg)   LMP 04/05/2011   BMI 11.30 kg/m   General: thin woman, seated in wheelchair, in no evident distress, graying black hair, brown eyes, left handed Head: normocephalic and atraumatic. Oropharynx benign other than very poor dentition. No dysmorphic features. She is unable to keep her head midline Neck: supple with no carotid bruits.  Cardiovascular: regular rate and rhythm, no murmurs. Respiratory: Clear to auscultation bilaterally Abdomen: Bowel sounds present all four quadrants, abdomen soft, non-tender, non-distended. No hepatosplenomegaly or masses palpated. Implanted intrathecal pump is easily palpated on her abdomen. Musculoskeletal: No skeletal deformities. She has neuromuscular scoliosis and contractures at the wrists, hips and ankles.  Skin: no rashes or neurocutaneous lesions. She reports a pressure ulcer on the buttock.   Neurologic Exam Mental Status: Awake and fully alert. Attention span, concentration and fund of knowledge appropriate for age. Speech with significant dysarthria and she is difficult to understand due to severe oral motor apraxia. She is able to follow some commands and participate in examination but is limited by her spastic quadriparesis. Cranial Nerves: Fundoscopic exam - red reflex present.  Unable to fully visualize fundus.  Pupils equal briskly reactive to light.  Extraocular movements intact. Hearing intact bilaterally to whisper.  Facial movements are asymmetric, has lower facial weakness with mild drooling.  Neck flexion and extension abnormal with poor head control.  Motor: Spastic dystonic quadriparesis. Right arm is flexed at the elbow. She is unable to fully extend her fingers. She has decreased fine motor abilities and uses her left hand to manipulate the joystick for her wheelchair. She can minimally abduct her legs at the hips.  Sensory: Withdrawal x 4 Coordination: Unable to adequately assess due to patient's inability to participate in examination. Dysmetria when reaching for objects. Gait and Station: Unable to independently stand and bear weight. I did not get her out of her chair today. Reflexes: Diminished and symmetric. Toes neutral. No clonus  Impression: 1. Spastic quadriparesis 2. Headaches 3. Dysarthria with oral motor apraxia 4. Anxiety and depression 5. History of  extreme prematurity 6. Reports of excessive daytime sleepiness   Recommendations for plan of care: The patient's previous [facility] records were reviewed. [name] [name] has neither had nor required imaging or lab studies since the last visit. She is  a 43 year old woman with history of extreme prematurity, spastic quadriparesis, headaches, dysarthria with oral motor apraxia, anxiety and depression and recent reports of excessive daytime sleepiness. [name] wanted to reduce the dose of Tizanidine and oral Baclofen because she was fearful of being sleepy at work and losing her job. I talked with her at some length about this and explained that she has been on the same doses for many years without sleepiness and if the doses were reduced that she would likely be too stiff and spastic to be able to work. I recommended that she talk with her employer about finding more work for her to day or work that would be more stimulating to her. [name] was alert and talkative at the time of this visit, and it is my feeling that if she had activities during the day that were more engaging that she would not have problems with sleepiness.   We talked about her difficulty answering her phone and I wrote a letter for her to present to the agency that is overseeing her daughter's care regarding [name]'s limitations with fine motor movements.   I also talked with [name] about her weight loss. I explained that adequate nutrition was needed to prevent further muscle atrophy, to strengthen her immune system and to help with wound healing. I encouraged [name] to consume the Boost supplements each day, to reduce her intake of sugary beverages and to consume a more healthful diet in general. I also encourage her to drink more water day, and recommended that she drink at last 36-40 oz of water each day.   Wt Readings from Last 3 Encounters:  06/22/19 52 lb 3.2 oz (23.7 kg)  02/17/19 101 lb 13.6 oz (46.2 kg)  01/30/19 103 lb 9.9 oz (47 kg)  She was weighed  today using a wheelchair scale. Her chair weighed 349.4 without her in it and weighed 401.6 with [name] seated in the scale.  [name] will return in September as previously planned to see Dr Hickling for pump refill. I am happy to see her sooner if needed for other problems. [name] agreed with the plans made today.  The medication list was reviewed and reconciled. No changes were made in the prescribed medications today. A complete medication list was provided to the patient.  Allergies as of 06/22/2019      Reactions   Morphine Dermatitis, Other (See Comments)   Skin turned red      Medication List       Accurate as of June 22, 2019  2:29 PM. If you have any questions, ask your nurse or doctor.        acetaminophen 500 MG tablet Commonly known as: TYLENOL Take 1,000 mg by mouth 2 (two) times daily as needed for headache (pain).   acetaminophen 650 MG CR tablet Commonly known as: Tylenol 8 Hour Take 1 tablet (650 mg total) by mouth every 8 (eight) hours as needed.   amantadine 100 MG capsule Commonly known as: SYMMETREL Take 100 mg by mouth daily.   azelastine 0.1 % nasal spray Commonly known as: ASTELIN Place 2 sprays into both nostrils 2 (two) times daily. Use in each nostril as directed   baclofen 20 MG tablet Commonly known as: LIORESAL Take 1 tablet (20 mg total) by mouth 4 (four) times daily. For bladder spasms   Belbuca 900 MCG Film Generic drug: Buprenorphine HCl Place 900 mcg inside cheek every 12 (twelve) hours.   bisacodyl 5 MG EC tablet Commonly   known as: DULCOLAX Take 15 mg by mouth daily as needed (constipation).   busPIRone 5 MG tablet Commonly known as: BUSPAR Take 5 mg by mouth 2 (two) times daily.   clonazePAM 0.5 MG tablet Commonly known as: KLONOPIN Take 1.5 tablets (0.75 mg total) by mouth 3 (three) times daily.   eucerin cream Apply topically as needed for wound care. What changed:   how much to take  when to take this   feeding supplement  (ENSURE ENLIVE) Liqd Take 237 mLs by mouth 2 (two) times daily between meals.   FLUoxetine 40 MG capsule Commonly known as: PROZAC Take 80 mg by mouth daily.   guaiFENesin 600 MG 12 hr tablet Commonly known as: MUCINEX Take 600 mg by mouth 2 (two) times daily as needed for cough or to loosen phlegm.   hydroxychloroquine 200 MG tablet Commonly known as: PLAQUENIL Take 200 mg by mouth daily.   lamoTRIgine 100 MG tablet Commonly known as: LAMICTAL Take 100 mg by mouth at bedtime.   Linzess 145 MCG Caps capsule Generic drug: linaclotide Take 145 mcg by mouth daily before breakfast.   Melatonin 5 MG Tabs Take 5 mg by mouth at bedtime.   Movantik 25 MG Tabs tablet Generic drug: naloxegol oxalate Take 25 mg by mouth daily.   Narcan 4 MG/0.1ML Liqd nasal spray kit Generic drug: naloxone Place 4 mg into the nose once as needed (severe opioid overdose).   omeprazole 20 MG capsule Commonly known as: PRILOSEC Take 1 capsule (20 mg total) by mouth daily.   ondansetron 4 MG tablet Commonly known as: ZOFRAN Take 1 tablet (4 mg total) by mouth every 6 (six) hours as needed for nausea.   oxybutynin 5 MG tablet Commonly known as: DITROPAN Take 5 mg by mouth 4 (four) times daily.   Oxycodone HCl 10 MG Tabs Take 10 mg by mouth every 6 (six) hours as needed (pain).   polyethylene glycol powder 17 GM/SCOOP powder Commonly known as: GLYCOLAX/MIRALAX Take 1  1/2 dose  daily in 12 ounces of fluid every day.   QUEtiapine 50 MG tablet Commonly known as: SEROQUEL Take 150 mg by mouth at bedtime.   rivaroxaban 10 MG Tabs tablet Commonly known as: XARELTO Take 10 mg by mouth daily.   tiZANidine 4 MG capsule Commonly known as: ZANAFLEX Take 1 capsule (4 mg total) by mouth 2 (two) times daily.   topiramate 100 MG tablet Commonly known as: TOPAMAX Take 1 tablet at bedtime What changed:   how much to take  how to take this  when to take this  additional instructions    venlafaxine XR 37.5 MG 24 hr capsule Commonly known as: EFFEXOR-XR Take 37.5 mg by mouth daily.      I consulted with Dr Hickling regarding this patient.  Total time spent with the patient was 45 minutes, of which 50% or more was spent in counseling and coordination of care.    NP-C [facility] Ph. [phone] Fax [phone]      

## 2019-06-22 NOTE — Patient Instructions (Addendum)
Thank you for coming in today.   Instructions for you until your next appointment are as follows: 1. Continue taking your medication as you have been doing.  2. Try to find ways to be busier during the day 3. I wrote a letter regarding your phone.   4. Start drinking the Boost supplements each day and work on consuming a healthy diet. Avoid sugary beverages and drink more water each day. I am concerned about your weight loss.  4. Please plan to return for follow up in September as previously planned for pump refill or sooner if needed.

## 2019-06-23 ENCOUNTER — Other Ambulatory Visit: Payer: Self-pay

## 2019-06-23 ENCOUNTER — Telehealth (INDEPENDENT_AMBULATORY_CARE_PROVIDER_SITE_OTHER): Payer: Medicare Other | Admitting: Family Medicine

## 2019-06-23 DIAGNOSIS — G808 Other cerebral palsy: Secondary | ICD-10-CM

## 2019-06-23 DIAGNOSIS — R634 Abnormal weight loss: Secondary | ICD-10-CM

## 2019-06-23 MED ORDER — BUSPIRONE HCL 5 MG PO TABS: 5.0000 mg | ORAL_TABLET | Freq: Two times a day (BID) | ORAL | 1 refills | Status: AC

## 2019-06-23 NOTE — Patient Instructions (Signed)
° ° ° °  If you have lab work done today you will be contacted with your lab results within the next 2 weeks.  If you have not heard from us then please contact us. The fastest way to get your results is to register for My Chart. ° ° °IF you received an x-ray today, you will receive an invoice from [facility]. Please contact [facility] at [phone] with questions or concerns regarding your invoice.  ° °IF you received labwork today, you will receive an invoice from [facility]. Please contact [facility] at [phone] with questions or concerns regarding your invoice.  ° °Our billing staff will not be able to assist you with questions regarding bills from these companies. ° °You will be contacted with the lab results as soon as they are available. The fastest way to get your results is to activate your My Chart account. Instructions are located on the last page of this paperwork. If you have not heard from us regarding the results in 2 weeks, please contact this office. °  ° ° ° °

## 2019-06-23 NOTE — Progress Notes (Signed)
CC: pt requesting to be tested for diabetes, pt c/o fatigue and weight loss.  Per pt she weighs 52 lb.  Pt needs refill on mucinex and buspar.  Caregiver advises referral were placed for wound care, ot, pt and home health-they have heard from wound care and pt has an appt but no call back about OT ,PT or home health services.  No travel outside the [place] or [place] in the past 3 weeks.

## 2019-06-23 NOTE — Progress Notes (Signed)
Telemedicine Encounter- SOAP NOTE Established Patient  This telephone encounter was conducted with the patient's (or proxy's) verbal consent via audio telecommunications: yes/no: Yes Patient was instructed to have this encounter in a suitably private space; and to only have persons present to whom they give permission to participate. In addition, patient identity was confirmed by use of name plus two identifiers (DOB and address).  I discussed the limitations, risks, security and privacy concerns of performing an evaluation and management service by telephone and the availability of in person appointments. I also discussed with the patient that there may be a patient responsible charge related to this service. The patient expressed understanding and agreed to proceed.  I spent a total of TIME; 0 MIN TO 60 MIN: 15 minutes talking with the patient or their proxy.  CC: weight loss  Subjective   [name] [name] is a 43 y.o. established patient. Telephone visit today for  HPI  Patient's Neurologist weighed the patient and she is calling because of concern for a 50 pound weight loss She drinks boost and is eating better She also has concern because her brother has diabetes and she wants testing The patient reports that she is not eating like she used to eat Her caregiver Marcha Dutton reports that she ate 2 pancakes   She has increased muscle cramps and it wakes her up and so she ends up sleepy while at work.  She has not had her referrals as yet  Patient Active Problem List   Diagnosis Date Noted  . Pressure injury of buttock, stage 2 ([place]) 04/20/2019  . Palliative care encounter   . Cough 01/04/2019  . Bad taste in mouth 01/04/2019  . Hypotension 11/19/2018  . Urinary tract infection associated with indwelling urethral catheter ([place]) 11/18/2018  . Solitary pulmonary nodule on lung CT 10/28/2018  . Connective tissue disorder ([place]) 08/07/2018  . Migraine without aura and  without status migrainosus, not intractable 06/26/2018  . UTI (urinary tract infection) 05/25/2018  . Headache 05/25/2018  . Incontinent of feces 03/11/2016  . Chronic anticoagulation 03/11/2016  . Involuntary movements 02/22/2016  . Seizures ([place]) 02/12/2016  . Dyspnea 02/12/2016  . Cerebral palsy, quadriplegic ([place]) 02/06/2016  . Swelling of extremity 01/13/2016  . Nausea without vomiting 11/09/2015  . Fever   . Pressure ulcer 10/09/2015  . Status post insertion of inferior vena caval filter 09/28/2015  . Absence of bladder continence 08/25/2015  . Person living in residential institution 06/14/2015  . Pulmonary hypertension ([place])   . Anemia of chronic disease   . Spasticity 03/15/2015  . Constipation, chronic 10/10/2014  . Athetoid cerebral palsy ([place]) 04/13/2014  . Dystonia 04/12/2014  . UTI (lower urinary tract infection) 03/16/2014  . Presence of intrathecal baclofen pump 03/07/2014  . Daytime sleepiness 10/16/2013  . Dental caries 07/18/2013  . Multiple thyroid nodules 05/20/2013  . Osteoarthrosis 04/07/2013  . [name]   . Dysarthria   . GERD (gastroesophageal reflux disease)   . Neurogenic bladder 02/05/2013  . Anxiety state 10/21/2012  . Chronic pain syndrome 06/17/2012  . Recurrent pulmonary embolism ([place]) 05/03/2011  . Pulmonary embolism with infarction ([place]) 05/03/2011  . Chronic suprapubic catheter ([place]) 03/15/2011  . Disease of female genital organs 10/24/2010  . HYPERTENSION, BENIGN 01/31/2010  . CP (cerebral palsy), spastic ([place]) 12/27/2008  . Contracted, joint, multiple sites 12/27/2008  . Major depressive disorder, recurrent episode ([place]) 01/29/2007  . Obsessive Compulsive Disorder 01/29/2007  . Transient alteration of awareness,  recurrent 09/08/2006    Past Medical History:  Diagnosis Date  . Acute respiratory failure with hypoxia ([place])   . Anemia   . Cellulitis 12/22/2014  . Cerebral palsy ([name])    Spastic Cerebral palsy,  mentally intact  . Contracture, joint, multiple sites    Electric wheelchair, uses left hand to operate chair.   . Depression   . [name]   . Dysarthria   . GERD (gastroesophageal reflux disease)   . Gram positive sepsis ([place]) 10/24/2014  . HCAP (healthcare-associated pneumonia)    2015, 2018  . History of endometriosis 10/2012   OBGYN [facility]: Laparoscopic Endometrial Ablation, TVH,  . History of recurrent UTIs   . Hypertension   . Incontinent of feces 03/11/2016  . Intracervical pessary 05/07/2011   Overview:  Overview:  Placed by [facility] 04/30/11 to treat DUB and Endometriosis.  She is seen at GYN clinic at [facility] but was considered a poor surgical candidate and referred her to [facility].  For DUB she had pelvic ultrasound that showed thin stripe and she had endometrial Bx by Dr Nori Riis in [place] clinic, which was negative.     . Macroscopic hematuria 03/10/2013   status post replacement of suprapubic tube 03/04/2013   . Migraine   . OCD (obsessive compulsive disorder)   . Parapneumonic effusion 10/25/2014  . Presence of intrathecal baclofen pump 03/07/2014   R LQ. Insertion T10.    [name] Kitchen Psychiatric illness 10/31/2014  . Recurrent pulmonary embolism ([name])    Pt has history of recurrent PE and is on lifelong coumadin.   . Seizures ([place]) 02/12/2016  . Spasticity 01/05/2014  . Transient alteration of awareness, recurrent 09/08/2006   Recurrent episodes where [place]  loses contact with her world and that have been studied thoroughly and appear nonepileptic in nature per Dr Krista Blue (neurology) The patient has had episodes starting in 2007 that initially began 1-4 times a day during which time she would have periods of unawareness followed by confusion. This continuous video EEG monitoring performed from October 8-12, 2007. Had   . Urethra dilated and patulous 07/03/2015   Patulous, Dilated Urethra. 14m balloon on a 22-Fr catheter hoping this would prevent the bladder spasm from  pushing the balloon down the urethra (Dr EAmalia Hailey [facility], July 2016)     Current Outpatient Medications  Medication Sig Dispense Refill  . acetaminophen (TYLENOL 8 HOUR) 650 MG CR tablet Take 1 tablet (650 mg total) by mouth every 8 (eight) hours as needed. 30 tablet 0  . acetaminophen (TYLENOL) 500 MG tablet Take 1,000 mg by mouth 2 (two) times daily as needed for headache (pain).    .[name] Kitchenamantadine (SYMMETREL) 100 MG capsule Take 100 mg by mouth daily.     .[name] Kitchenazelastine (ASTELIN) 0.1 % nasal spray Place 2 sprays into both nostrils 2 (two) times daily. Use in each nostril as directed    . baclofen (LIORESAL) 20 MG tablet Take 1 tablet (20 mg total) by mouth 4 (four) times daily. For bladder spasms 30 each 2  . bisacodyl (DULCOLAX) 5 MG EC tablet Take 15 mg by mouth daily as needed (constipation).    . Buprenorphine HCl (BELBUCA) 900 MCG FILM Place 900 mcg inside cheek every 12 (twelve) hours.    . busPIRone (BUSPAR) 5 MG tablet Take 1 tablet (5 mg total) by mouth 2 (two) times daily. 90 tablet 1  . clonazePAM (KLONOPIN) 0.5 MG tablet Take 1.5 tablets (0.75 mg total) by  mouth 3 (three) times daily. 15 tablet 0  . FLUoxetine (PROZAC) 40 MG capsule Take 80 mg by mouth daily.    [name] Kitchen guaiFENesin (MUCINEX) 600 MG 12 hr tablet Take 600 mg by mouth 2 (two) times daily as needed for cough or to loosen phlegm.     . hydroxychloroquine (PLAQUENIL) 200 MG tablet Take 200 mg by mouth daily.    [name] Kitchen lamoTRIgine (LAMICTAL) 100 MG tablet Take 100 mg by mouth at bedtime.    [name] Kitchen linaclotide (LINZESS) 145 MCG CAPS capsule Take 145 mcg by mouth daily before breakfast.     . Melatonin 5 MG TABS Take 5 mg by mouth at bedtime.     . naloxegol oxalate (MOVANTIK) 25 MG TABS tablet Take 25 mg by mouth daily.    [name] Kitchen NARCAN 4 MG/0.1ML LIQD nasal spray kit Place 4 mg into the nose once as needed (severe opioid overdose).     [name] Kitchen omeprazole (PRILOSEC) 20 MG capsule Take 1 capsule (20 mg total) by mouth daily. 30 capsule 3  .  ondansetron (ZOFRAN) 4 MG tablet Take 1 tablet (4 mg total) by mouth every 6 (six) hours as needed for nausea. 20 tablet 0  . oxybutynin (DITROPAN) 5 MG tablet Take 5 mg by mouth 4 (four) times daily.     . Oxycodone HCl 10 MG TABS Take 10 mg by mouth every 6 (six) hours as needed (pain).    . polyethylene glycol powder (GLYCOLAX/MIRALAX) powder Take 1  1/2 dose  daily in 12 ounces of fluid every day. 255 g 3  . QUEtiapine (SEROQUEL) 50 MG tablet Take 150 mg by mouth at bedtime.     . rivaroxaban (XARELTO) 10 MG TABS tablet Take 10 mg by mouth daily.     . Skin Protectants, Misc. (EUCERIN) cream Apply topically as needed for wound care. (Patient taking differently: Apply 1 application topically daily as needed for wound care. ) 397 g 0  . tiZANidine (ZANAFLEX) 4 MG capsule Take 1 capsule (4 mg total) by mouth 2 (two) times daily. 60 capsule 2  . topiramate (TOPAMAX) 100 MG tablet Take 1 tablet at bedtime (Patient taking differently: Take 100 mg by mouth at bedtime. ) 30 tablet 5  . venlafaxine XR (EFFEXOR-XR) 37.5 MG 24 hr capsule Take 37.5 mg by mouth daily.    . feeding supplement, ENSURE ENLIVE, (ENSURE ENLIVE) LIQD Take 237 mLs by mouth 2 (two) times daily between meals. (Patient not taking: Reported on 06/23/2019) 237 mL 0   No current facility-administered medications for this visit.     Allergies  Allergen Reactions  . Morphine Dermatitis and Other (See Comments)    Skin turned red     Social History   Socioeconomic History  . Marital status: Divorced    Spouse name: Not on file  . Number of children: 2  . Years of education: college  . Highest education level: Not on file  Occupational History    Employer: UNEMPLOYED  Social Needs  . Financial resource strain: Not on file  . Food insecurity    Worry: Not on file    Inability: Not on file  . Transportation needs    Medical: Not on file    Non-medical: Not on file  Tobacco Use  . Smoking status: Never Smoker  .  Smokeless tobacco: Never Used  Substance and Sexual Activity  . Alcohol use: Yes    Alcohol/week: 1.0 standard drinks    Types: 1 Glasses of wine per  week    Comment: Drinks alcohol once a month.  . Drug use: No  . Sexual activity: Never  Lifestyle  . Physical activity    Days per week: Not on file    Minutes per session: Not on file  . Stress: Not on file  Relationships  . Social Herbalist on phone: Not on file    Gets together: Not on file    Attends religious service: Not on file    Active member of club or organization: Not on file    Attends meetings of clubs or organizations: Not on file    Relationship status: Not on file  . Intimate partner violence    Fear of current or ex partner: Not on file    Emotionally abused: Not on file    Physically abused: Not on file    Forced sexual activity: Not on file  Other Topics Concern  . 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    She enjoys shopping.   Lives in a [place] since 02/11/16      Delrae Sawyers, who has schizophrenia, MR, is father of daughter.  Pilar Plate and pt are no longer together.  Pilar Plate does not see Cleotis Lema. Daughter is 52 yo, Nat Math, lives in a group home in [place].    [name]'s mother reportedly has breast cancer.    Needs assistance with ADL's and IADL's--has personal care services 20 hrs/wk;       No tobacco, EtOH, drugs.      **Case Manager: Maia Petties [phone]      Patient lives [facility]    ROS  Objective   Wt Readings from Last 3 Encounters:  06/22/19 52 lb 3.2 oz (23.7 kg)  02/17/19 101 lb 13.6 oz (46.2 kg)  01/30/19 103 lb 9.9 oz (47 kg)    Vitals as reported by the patient: There were no vitals filed for this visit.  Diagnoses and all orders for this visit:  Cerebral palsy, quadriplegic ([place])  Loss of weight  Other orders -      busPIRone (BUSPAR) 5 MG tablet; Take 1 tablet (5 mg total) by mouth 2 (two) times daily.   Discussed that she has to come in for verification of weight since a 50 pound weight loss would be associated with wasting, hypoalbuminemia and iron depletion  She may try melatonin 10 mg for sleep   I discussed the assessment and treatment plan with the patient. The patient was provided an opportunity to ask questions and all were answered. The patient agreed with the plan and demonstrated an understanding of the instructions.   The patient was advised to call back or seek an in-person evaluation if the symptoms worsen or if the condition fails to improve as anticipated.  I provided 15 minutes of non-face-to-face time during this encounter.  Forrest Moron, MD  Primary Care at [facility]

## 2019-06-24 ENCOUNTER — Telehealth: Payer: Self-pay | Admitting: Family Medicine

## 2019-06-24 NOTE — Telephone Encounter (Signed)
Pt had virtual visit on yesterday and provider has forms to complete.  Will contact pt when completed. Dgaddy, CMA

## 2019-06-24 NOTE — Telephone Encounter (Signed)
Called pt to make an appt to come into the office at next available appt time per Dr. [name]. Mailbox was full

## 2019-06-24 NOTE — Progress Notes (Signed)
Called to make appt. Mailbox was full

## 2019-06-28 ENCOUNTER — Encounter (HOSPITAL_COMMUNITY): Payer: Self-pay

## 2019-06-28 ENCOUNTER — Ambulatory Visit (INDEPENDENT_AMBULATORY_CARE_PROVIDER_SITE_OTHER): Payer: Medicare Other | Admitting: Family

## 2019-06-28 ENCOUNTER — Other Ambulatory Visit: Payer: Self-pay

## 2019-06-28 ENCOUNTER — Inpatient Hospital Stay (HOSPITAL_COMMUNITY)
Admission: EM | Admit: 2019-06-28 | Discharge: 2019-07-02 | DRG: 154 | Disposition: A | Discharge status: Home / Self Care | Payer: Medicare Other | Attending: Internal Medicine | Admitting: Internal Medicine

## 2019-06-28 DIAGNOSIS — F339 Major depressive disorder, recurrent, unspecified: Secondary | ICD-10-CM | POA: Diagnosis present

## 2019-06-28 DIAGNOSIS — G800 Spastic quadriplegic cerebral palsy: Secondary | ICD-10-CM | POA: Diagnosis present

## 2019-06-28 DIAGNOSIS — R569 Unspecified convulsions: Secondary | ICD-10-CM

## 2019-06-28 DIAGNOSIS — F411 Generalized anxiety disorder: Secondary | ICD-10-CM | POA: Diagnosis present

## 2019-06-28 DIAGNOSIS — Z87440 Personal history of urinary (tract) infections: Secondary | ICD-10-CM

## 2019-06-28 DIAGNOSIS — Z1624 Resistance to multiple antibiotics: Secondary | ICD-10-CM | POA: Diagnosis present

## 2019-06-28 DIAGNOSIS — Z7901 Long term (current) use of anticoagulants: Secondary | ICD-10-CM

## 2019-06-28 DIAGNOSIS — G808 Other cerebral palsy: Secondary | ICD-10-CM | POA: Diagnosis present

## 2019-06-28 DIAGNOSIS — G40909 Epilepsy, unspecified, not intractable, without status epilepticus: Secondary | ICD-10-CM | POA: Diagnosis present

## 2019-06-28 DIAGNOSIS — L949 Localized connective tissue disorder, unspecified: Secondary | ICD-10-CM | POA: Diagnosis present

## 2019-06-28 DIAGNOSIS — L03221 Cellulitis of neck: Secondary | ICD-10-CM | POA: Diagnosis present

## 2019-06-28 DIAGNOSIS — Z86711 Personal history of pulmonary embolism: Secondary | ICD-10-CM

## 2019-06-28 DIAGNOSIS — Z825 Family history of asthma and other chronic lower respiratory diseases: Secondary | ICD-10-CM

## 2019-06-28 DIAGNOSIS — K1120 Sialoadenitis, unspecified: Secondary | ICD-10-CM | POA: Diagnosis present

## 2019-06-28 DIAGNOSIS — I10 Essential (primary) hypertension: Secondary | ICD-10-CM | POA: Diagnosis present

## 2019-06-28 DIAGNOSIS — K1121 Acute sialoadenitis: Secondary | ICD-10-CM | POA: Diagnosis not present

## 2019-06-28 DIAGNOSIS — F329 Major depressive disorder, single episode, unspecified: Secondary | ICD-10-CM | POA: Diagnosis present

## 2019-06-28 DIAGNOSIS — G801 Spastic diplegic cerebral palsy: Secondary | ICD-10-CM | POA: Diagnosis present

## 2019-06-28 DIAGNOSIS — K219 Gastro-esophageal reflux disease without esophagitis: Secondary | ICD-10-CM | POA: Diagnosis present

## 2019-06-28 DIAGNOSIS — N319 Neuromuscular dysfunction of bladder, unspecified: Secondary | ICD-10-CM | POA: Diagnosis present

## 2019-06-28 DIAGNOSIS — M609 Myositis, unspecified: Secondary | ICD-10-CM | POA: Diagnosis present

## 2019-06-28 DIAGNOSIS — I2699 Other pulmonary embolism without acute cor pulmonale: Secondary | ICD-10-CM | POA: Diagnosis present

## 2019-06-28 DIAGNOSIS — G894 Chronic pain syndrome: Secondary | ICD-10-CM | POA: Diagnosis present

## 2019-06-28 DIAGNOSIS — Z20828 Contact with and (suspected) exposure to other viral communicable diseases: Secondary | ICD-10-CM | POA: Diagnosis present

## 2019-06-28 DIAGNOSIS — E876 Hypokalemia: Secondary | ICD-10-CM | POA: Diagnosis not present

## 2019-06-28 MED ORDER — SODIUM CHLORIDE 0.9 % IV SOLN
3.0000 g | Freq: Once | INTRAVENOUS | Status: AC
  Administered 2019-06-28: 3 g via INTRAVENOUS
  Filled 2019-06-28: qty 8

## 2019-06-28 MED ORDER — FENTANYL CITRATE (PF) 100 MCG/2ML IJ SOLN
50.0000 ug | INTRAMUSCULAR | Status: DC | PRN
  Administered 2019-06-28 – 2019-06-29 (×2): 50 ug via INTRAVENOUS
  Filled 2019-06-28 (×2): qty 2

## 2019-06-28 MED ORDER — SODIUM CHLORIDE 0.9 % IV BOLUS
1000.0000 mL | Freq: Once | INTRAVENOUS | Status: AC
  Administered 2019-06-28: 23:00:00 1000 mL via INTRAVENOUS

## 2019-06-28 NOTE — Progress Notes (Signed)
Arrived @ bedside for PIV placement with blood draw per consult. On arrival, RN exiting room with ultrasound. Stated patient has 22 gauge and she has drawn blood.[name] Kitchen VAST services not needed @ this time.

## 2019-06-28 NOTE — ED Notes (Signed)
Unable to obtain bloodwork

## 2019-06-28 NOTE — ED Provider Notes (Signed)
[facility] DEPT Provider Note   CSN: [id number] Arrival date & time: 06/28/19  2045    History   Chief Complaint Chief Complaint  Patient presents with  . Neck Injury  . Shoulder Pain    HPI [name] [name] is a 43 y.o. female with a history of spastic quadriplegic cerebral palsy, HTN, DJD, recurrent UTIs, suprapubic catheter, multiple dental caries, and recurrent PEs who presents to the emergency department with a chief complaint of left-sided neck pain.  The patient endorses constant left-sided neck pain and swelling that has been progressively worsening over the last 2 weeks.  No known aggravating or alleviating factors.  Per triage note, the patient reports that she is now having some left shoulder pain; however, patient denies shoulder pain on my evaluation.  There is associated redness and swelling to the left jaw, which she is unsure of how long has been present.  Patient has been taking her home pain medication without improvement in her symptoms.  She denies fever, chills, sore throat, dysphagia, or N/V/D. She reports she has had intermittent shortness of breath.  No chest pain or cough.  No other treatment prior to arrival.  History is limited as patient is repeatedly asking for pain medication and water.     The history is provided by the patient. No language interpreter was used.    Past Medical History:  Diagnosis Date  . Acute respiratory failure with hypoxia ([place])   . Anemia   . Cellulitis 12/22/2014  . Cerebral palsy ([name])    Spastic Cerebral palsy, mentally intact  . Contracture, joint, multiple sites    Electric wheelchair, uses left hand to operate chair.   . Depression   . [name]   . Dysarthria   . GERD (gastroesophageal reflux disease)   . Gram positive sepsis ([place]) 10/24/2014  . HCAP (healthcare-associated pneumonia)    2015, 2018  . History of endometriosis 10/2012   OBGYN [facility]: Laparoscopic  Endometrial Ablation, TVH,  . History of recurrent UTIs   . Hypertension   . Incontinent of feces 03/11/2016  . Intracervical pessary 05/07/2011   Overview:  Overview:  Placed by [facility]YN 04/30/11 to treat DUB and Endometriosis.  She is seen at GYN clinic at [facility] but was considered a poor surgical candidate and referred her to [facility].  For DUB she had pelvic ultrasound that showed thin stripe and she had endometrial Bx by Dr [name] [name] in [place] clinic, which was negative.     . Macroscopic hematuria 03/10/2013   status post replacement of suprapubic tube 03/04/2013   . Migraine   . OCD (obsessive compulsive disorder)   . Parapneumonic effusion 10/25/2014  . Presence of intrathecal baclofen pump 03/07/2014   R LQ. Insertion T10.    [name] Kitchen Psychiatric illness 10/31/2014  . Recurrent pulmonary embolism ([name])    Pt has history of recurrent PE and is on lifelong coumadin.   . Seizures ([place]) 02/12/2016  . Spasticity 01/05/2014  . Transient alteration of awareness, recurrent 09/08/2006   Recurrent episodes where [name] [name]  loses contact with her world and that have been studied thoroughly and appear nonepileptic in nature per Dr [name] [name] (neurology) The patient has had episodes starting in 2007 that initially began 1-4 times a day during which time she would have periods of unawareness followed by confusion. This continuous video EEG monitoring performed from October 8-12, 2007. Had   . Urethra dilated and patulous 07/03/2015   Patulous, Dilated  Urethra. 21m balloon on a 22-Fr catheter hoping this would prevent the bladder spasm from pushing the balloon down the urethra (Dr EAmalia [name] [facility], July 2016)     Patient Active Problem List   Diagnosis Date Noted  . Acute parotitis 06/29/2019  . Pressure injury of buttock, stage 2 ([place] 04/20/2019  . Palliative care encounter   . Cough 01/04/2019  . Bad taste in mouth 01/04/2019  . Hypotension 11/19/2018  . Urinary tract infection associated with indwelling urethral  catheter ([place] 11/18/2018  . Solitary pulmonary nodule on lung CT 10/28/2018  . Connective tissue disorder ([facility] [place] 08/07/2018  . Migraine without aura and without status migrainosus, not intractable 06/26/2018  . UTI (urinary tract infection) 05/25/2018  . Headache 05/25/2018  . Incontinent of feces 03/11/2016  . Chronic anticoagulation 03/11/2016  . Involuntary movements 02/22/2016  . Seizures ([place] 02/12/2016  . Dyspnea 02/12/2016  . Cerebral palsy, quadriplegic ([place] 02/06/2016  . Swelling of extremity 01/13/2016  . Nausea without vomiting 11/09/2015  . Fever   . Pressure ulcer 10/09/2015  . Status post insertion of inferior vena caval filter 09/28/2015  . Absence of bladder continence 08/25/2015  . Person living in residential institution 06/14/2015  . Pulmonary hypertension ([place]   . Anemia of chronic disease   . Spasticity 03/15/2015  . Constipation, chronic 10/10/2014  . Athetoid cerebral palsy ([place] 04/13/2014  . Dystonia 04/12/2014  . UTI (lower urinary tract infection) 03/16/2014  . Presence of intrathecal baclofen pump 03/07/2014  . Daytime sleepiness 10/16/2013  . Dental caries 07/18/2013  . Multiple thyroid nodules 05/20/2013  . Osteoarthrosis 04/07/2013  . [name]   . Dysarthria   . GERD (gastroesophageal reflux disease)   . Neurogenic bladder 02/05/2013  . Anxiety state 10/21/2012  . Chronic pain syndrome 06/17/2012  . Recurrent pulmonary embolism ([place] 05/03/2011  . Pulmonary embolism with infarction ([place] 05/03/2011  . Chronic suprapubic catheter ([place] 03/15/2011  . Disease of female genital organs 10/24/2010  . HYPERTENSION, BENIGN 01/31/2010  . CP (cerebral palsy), spastic ([place] 12/27/2008  . Contracted, joint, multiple sites 12/27/2008  . Major depressive disorder, recurrent episode ([place] 01/29/2007  . Obsessive Compulsive Disorder 01/29/2007  . Transient alteration of awareness, recurrent 09/08/2006    Past Surgical History:   Procedure Laterality Date  . ABDOMINAL HYSTERECTOMY    . APPENDECTOMY    . BACLOFEN PUMP REFILL     x 3 times  . CARPAL TUNNEL RELEASE  08/2008   Dr SDaylene Katayama . CESAREAN SECTION     x 2  . CHOLECYSTECTOMY  10/14/2015   Procedure: LAPAROSCOPIC CHOLECYSTECTOMY;  Surgeon: BGeorganna Skeans MD;  [facility]  Service: General;;  . COLPOSCOPY  06/2000  . INTRAUTERINE DEVICE INSERTION  04/30/11   [place]  . LAPAROSCOPIC ASSISTED VAGINAL HYSTERECTOMY  10/27/2012  . MULTIPLE EXTRACTIONS WITH ALVEOLOPLASTY Bilateral 07/19/2013   Procedure: EXTRACTIONS #4, 51,69,45,03  Surgeon: CIsac Caddy DDS;  [facility]  Service: Oral Surgery;  Laterality: Bilateral;  . NEUROMA SURGERY Left    anterior and posterior intersosseous  . PAIN PUMP IMPLANTATION N/A 03/07/2014   Procedure: baclofen pump revision/replacement and Catheter connection replacement;  Surgeon: JOphelia Charter MD;  [facility];  Service: Neurosurgery;  Laterality: N/A;  baclofen pump revision/replacement and Catheter connection replacement  . PROGRAMABLE BACLOFEN PUMP REVISION  03/17/14   Battery Replacement   . TUBAL LIGATION  2003  . URETHRA SURGERY    .  WRIST SURGERY  06/2010   Dr Nicoletta Dress, hand surgeon, [name]     OB History   No obstetric history on file.      Home Medications    Prior to Admission medications   Medication Sig Start Date End Date Taking? Authorizing Provider  acetaminophen (TYLENOL) 500 MG tablet Take 1,000 mg by mouth 2 (two) times daily as needed for headache (pain).   Yes [provider]  amantadine (SYMMETREL) 100 MG capsule Take 100 mg by mouth daily.    Yes [provider]  azelastine (ASTELIN) 0.1 % nasal spray Place 2 sprays into both nostrils 2 (two) times daily. Use in each nostril as directed   Yes [provider]  baclofen (LIORESAL) 20 MG tablet Take 1 tablet (20 mg total) by mouth 4 (four) times daily. For  bladder spasms 12/28/18  Yes Rockwell Germany, NP  Buprenorphine HCl (BELBUCA) 900 MCG FILM Place 900 mcg inside cheek every 12 (twelve) hours.   Yes [provider]  busPIRone (BUSPAR) 5 MG tablet Take 1 tablet (5 mg total) by mouth 2 (two) times daily. 06/23/19  Yes Forrest Moron, MD  clonazePAM (KLONOPIN) 0.5 MG tablet Take 1.5 tablets (0.75 mg total) by mouth 3 (three) times daily. 11/27/17  Yes Mariel Aloe, MD  feeding supplement (BOOST HIGH PROTEIN) LIQD Take 1 Container by mouth 2 (two) times daily between meals.   Yes [provider]  FLUoxetine (PROZAC) 40 MG capsule Take 80 mg by mouth daily.   Yes [provider]  hydroxychloroquine (PLAQUENIL) 200 MG tablet Take 200 mg by mouth daily.   Yes [provider]  lamoTRIgine (LAMICTAL) 100 MG tablet Take 100 mg by mouth at bedtime.   Yes [provider]  linaclotide (LINZESS) 145 MCG CAPS capsule Take 145 mcg by mouth daily before breakfast.    Yes [provider]  Melatonin 5 MG TABS Take 5 mg by mouth at bedtime.    Yes [provider]  naloxegol oxalate (MOVANTIK) 25 MG TABS tablet Take 25 mg by mouth daily.   Yes [provider]  omeprazole (PRILOSEC) 20 MG capsule Take 1 capsule (20 mg total) by mouth daily. 01/04/19  Yes Sagardia, Ines Bloomer, MD  oxybutynin (DITROPAN-XL) 5 MG 24 hr tablet Take 5 mg by mouth daily.   Yes [provider]  QUEtiapine (SEROQUEL) 50 MG tablet Take 150 mg by mouth at bedtime.  01/08/19  Yes [provider]  rivaroxaban (XARELTO) 10 MG TABS tablet Take 10 mg by mouth daily.    Yes [provider]  tiZANidine (ZANAFLEX) 4 MG capsule Take 1 capsule (4 mg total) by mouth 2 (two) times daily. 12/28/18  Yes Rockwell Germany, NP  topiramate (TOPAMAX) 100 MG tablet Take 1 tablet at bedtime Patient taking differently: Take 100 mg by mouth at bedtime.  02/10/19  Yes Rockwell Germany, NP  venlafaxine XR (EFFEXOR-XR)  37.5 MG 24 hr capsule Take 37.5 mg by mouth daily. 01/08/19  Yes [provider]  acetaminophen (TYLENOL 8 HOUR) 650 MG CR tablet Take 1 tablet (650 mg total) by mouth every 8 (eight) hours as needed. Patient not taking: Reported on 06/28/2019 07/08/18   Varney Biles, MD  bisacodyl (DULCOLAX) 5 MG EC tablet Take 15 mg by mouth daily as needed (constipation).    [provider]  feeding supplement, ENSURE ENLIVE, (ENSURE ENLIVE) LIQD Take 237 mLs by mouth 2 (two) times daily between meals. Patient not taking: Reported on  06/23/2019 05/30/18   Kayleen Memos, DO  guaiFENesin (MUCINEX) 600 MG 12 hr tablet Take 600 mg by mouth 2 (two) times daily as needed for cough or to loosen phlegm.     [provider]  NARCAN 4 MG/0.1ML LIQD nasal spray kit Place 4 mg into the nose once as needed (severe opioid overdose).  02/24/19   [provider]  ondansetron (ZOFRAN) 4 MG tablet Take 1 tablet (4 mg total) by mouth every 6 (six) hours as needed for nausea. 11/19/18   Georgette Shell, MD  Oxycodone HCl 10 MG TABS Take 10 mg by mouth every 6 (six) hours as needed (pain).    [provider]  polyethylene glycol powder (GLYCOLAX/MIRALAX) powder Take 1  1/2 dose  daily in 12 ounces of fluid every day. Patient taking differently: Take 0.5-1 Containers by mouth daily as needed for moderate constipation. Take 1  1/2 dose  daily in 12 ounces of fluid every day. 01/01/17   Esterwood, [name] S, PA-C  Skin Protectants, Misc. (EUCERIN) cream Apply topically as needed for wound care. Patient taking differently: Apply 1 application topically daily as needed for wound care.  02/05/19   Albrizze, Harley Hallmark, PA-C    Family History Family History  Problem Relation Age of Onset  . Asthma Father   . Colon cancer Maternal Grandmother        Died in her 22's  . Breast cancer Paternal Grandmother        Died in her 24's  . Heart attack Maternal Grandfather        Died in his 26's  .  Alzheimer's disease Paternal Grandfather        Died in his 74's  . Breast cancer Mother   . Stomach cancer Neg Hx     Social History Social History   Tobacco Use  . Smoking status: Never Smoker  . Smokeless tobacco: Never Used  Substance Use Topics  . Alcohol use: Yes    Alcohol/week: 1.0 standard drinks    Types: 1 Glasses of wine per week    Comment: Drinks alcohol once a month.  . Drug use: No     Allergies   Morphine   Review of Systems Review of Systems  Constitutional: Negative for chills and fever.  HENT: Positive for facial swelling. Negative for sore throat, trouble swallowing and voice change.   Eyes: Negative for visual disturbance.  Respiratory: Positive for shortness of breath. Negative for cough and wheezing.   Gastrointestinal: Negative for abdominal pain, diarrhea, nausea and vomiting.  Musculoskeletal: Positive for myalgias and neck pain. Negative for arthralgias, back pain, gait problem and neck stiffness.  Neurological: Negative for dizziness, weakness and numbness.     Physical Exam Updated Vital Signs BP (!) 133/58   Pulse 93   Temp 98.2 F (36.8 C) (Oral)   Resp (!) 24   Ht '5\' 2"'  (1.575 m)   Wt 49.9 kg   LMP 04/05/2011   SpO2 98%   BMI 20.12 kg/m   Physical Exam Vitals signs and nursing note reviewed.  Constitutional:      General: She is not in acute distress. HENT:     Head: Normocephalic.     Comments: Fluctuant mass noted just inferior to the body of the left mandible.  There is redness noted along the left cheek extending down to the superior portion of the left neck.  No meningismus.  Trachea is midline.  Uvula is midline.  Posterior  oropharynx is patent and without edema or exudate.  Extremely poor dentition throughout.  Multiple teeth are avulsed.  No obvious dental abscess. Eyes:     Conjunctiva/sclera: Conjunctivae normal.  Neck:     Musculoskeletal: Neck supple.  Cardiovascular:     Rate and Rhythm: Normal rate and  regular rhythm.     Pulses: Normal pulses.     Heart sounds: Normal heart sounds. No murmur. No friction rub. No gallop.   Pulmonary:     Effort: Pulmonary effort is normal. No respiratory distress.     Breath sounds: No stridor. No wheezing, rhonchi or rales.  Chest:     Chest wall: No tenderness.  Abdominal:     General: There is no distension.     Palpations: Abdomen is soft.  Skin:    General: Skin is warm.     Findings: No rash.  Neurological:     Mental Status: She is alert.  Psychiatric:        Behavior: Behavior normal.      ED Treatments / Results  Labs (all labs ordered are listed, but only abnormal results are displayed) Labs Reviewed  BASIC METABOLIC PANEL - Abnormal; Notable for the following components:      Result Value   CO2 17 (*)    Glucose, Bld 119 (*)    Calcium 8.6 (*)    All other components within normal limits  CBC WITH DIFFERENTIAL/PLATELET - Abnormal; Notable for the following components:   WBC 16.0 (*)    RBC 3.36 (*)    Hemoglobin 10.3 (*)    HCT 32.6 (*)    Neutro Abs 12.7 (*)    Monocytes Absolute 1.4 (*)    Abs Immature Granulocytes 0.18 (*)    All other components within normal limits  SARS CORONAVIRUS 2 (HOSPITAL ORDER, [place] [facility])  CBC WITH DIFFERENTIAL/PLATELET  LACTIC ACID, PLASMA    EKG None  Radiology Ct Soft Tissue Neck W Contrast  Result Date: 06/29/2019 CLINICAL DATA:  Initial evaluation for acute neck pain for 2 weeks, pain in left shoulder with redness and swelling to left jaw. EXAM: CT NECK WITH CONTRAST TECHNIQUE: Multidetector CT imaging of the neck was performed using the standard protocol following the bolus administration of intravenous contrast. CONTRAST:  59m OMNIPAQUE IOHEXOL 300 MG/ML  SOLN COMPARISON:  None available. FINDINGS: Pharynx and larynx: Examination mildly limited by streak artifact from extensive spinal fusion hardware. Oral cavity within normal limits without discrete mass  or loculated collection. No acute abnormality about the dentition. No base of tongue lesion. Palatine tonsils not well visualized, and may be absent. Mild asymmetric fullness with mucosal edema within the left pharynx, favored to be secondary in nature to the acute inflammatory process within the left face/neck. Nasopharynx within normal limits. No visible retropharyngeal collection. Epiglottis within normal limits. Vallecula clear. Remainder of the hypopharynx and supraglottic larynx within normal limits. Glottis symmetric and normal. Subglottic airway clear. Salivary glands: Asymmetric enlargement and hyperenhancement of the left parotid gland, suggesting acute parotitis. Associated swelling with inflammatory stranding throughout the adjacent left parotid space, extending into the left masticator and submandibular spaces. Swelling with hazy inflammatory stranding extends inferiorly along the left neck, suggesting associated regional cellulitis. No obstructive stone seen within Stensen's duct. No intraglandular abscess or other complication. Right parotid and submandibular glands are within normal limits. Mild hazy stranding about the left submandibular gland felt to be secondary in nature, with the gland itself felt to be  within normal limits. Thyroid: Partially visualized thyroid grossly normal, although evaluation somewhat limited by streak artifact by adjacent spinal fusion hardware. Lymph nodes: Prominent and mildly enlarged left level II lymph nodes measure up to 11 mm, likely reactive. No other pathologically enlarged lymph nodes identified within the neck. Vascular: Normal intravascular enhancement seen throughout the neck. Limited intracranial: Unremarkable. Visualized orbits: Visualized globes and orbital soft tissues within normal limits. Mastoids and visualized paranasal sinuses: Visualized paranasal sinuses are clear. Visualized mastoids and middle ear cavities are well pneumatized and free of fluid.  Skeleton: No acute osseous finding. No discrete lytic or blastic osseous lesions. Prior ACDF at C4 through T1. Extensive posterior fusion throughout the visualized cervicothoracic spine. Upper chest: Visualized upper chest demonstrates no acute finding. Partially visualized lungs are grossly clear. Other: None. IMPRESSION: 1. Asymmetric enlargement and hyperenhancement involving the left parotid gland, consistent with acute parotitis. Diffuse swelling with inflammatory stranding within the adjacent left face/neck compatible with associated regional cellulitis. No obstructive stone, abscess, or other complication identified. 2. Prominent and mildly enlarged left level II lymph nodes, likely reactive. Electronically Signed   By: Jeannine Boga M.D.   On: 06/29/2019 01:48    Procedures Procedures (including critical care time)  Medications Ordered in ED Medications  fentaNYL (SUBLIMAZE) injection 50 mcg (50 mcg Intravenous Given 06/29/19 0222)  sodium chloride (PF) 0.9 % injection (has no administration in time range)  vancomycin (VANCOCIN) IVPB 1000 mg/200 mL premix (1,000 mg Intravenous New Bag/Given 06/29/19 0238)  piperacillin-tazobactam (ZOSYN) IVPB 3.375 g (has no administration in time range)  Ampicillin-Sulbactam (UNASYN) 3 g in sodium chloride 0.9 % 100 mL IVPB (0 g Intravenous Stopped 06/29/19 0039)  sodium chloride 0.9 % bolus 1,000 mL (0 mLs Intravenous Stopped 06/29/19 0039)  iohexol (OMNIPAQUE) 300 MG/ML solution 75 mL (75 mLs Intravenous Contrast Given 06/29/19 0043)     Initial Impression / Assessment and Plan / ED Course  I have reviewed the triage vital signs and the nursing notes.  Pertinent labs & imaging results that were available during my care of the patient were reviewed by me and considered in my medical decision making (see chart for details).        43 year old female with a history of spastic quadriplegic cerebral palsy, HTN, DJD, recurrent UTIs, suprapubic  catheter, multiple dental caries, and recurrent PEs presenting with left-sided neck pain, edema, erythema, and warmth.  No fever or chills.  Neck pain is been present for the last 2 weeks, but other symptoms are new over the last few days.  On exam, the patient has poor dentition throughout.  There is a fluctuant mass noted just inferior to the body of the mandible on the left.  There is overlying redness and warmth to the left side of the neck, concerning for cellulitis.  Uvula is midline and posterior oropharynx is unremarkable.  CT neck was ordered given significant from concern for periapical abscess given the patient's dentition.  She was given Unasyn and pain medication.  Labs are notable for leukocytosis of 16.  Bicarb is decreased to 17, likely secondary to decreased p.o. intake.  CT neck consistent with acute parotitis with regional cellulitis.  No stone visualized on CT.  Edema extends into the left masticator and submandibular spaces.  Per chart review, patient has tested positive for MRSA in the past.  Will add on vancomycin.  Given extension into potential spaces, patient will require admission for IV antibiotics.  Blood cultures and lactate was not initially ordered  given lack of constitutional symptoms as patient did not meet sepsis criteria.  Patient has been afebrile since arrival in the ER without tachycardia, hypoxia, tachypnea..  BP with EMS was 107/75, 90s/60s on arrival to the ER. Improved to 133/67s with IV fluid bolus.   The patient was discussed with Dr. Wynona Canes, attending physician, who independently evaluated the patient.  Discussed the patient with Dr. Myna Hidalgo, hospitalist, who will accept the patient for admission. The patient appears reasonably stabilized for admission considering the current resources, flow, and capabilities available in the ED at this time, and I doubt any other [facility] requiring further screening and/or treatment in the ED prior to admission.  Final Clinical  Impressions(s) / ED Diagnoses   Final diagnoses:  Acute parotitis  Cellulitis of neck    ED Discharge Orders    None       Joanne Gavel, PA-C 06/29/19 0243    Rolland Porter, MD 06/29/19 [phone]

## 2019-06-28 NOTE — ED Triage Notes (Signed)
Per ems: Pt coming from home c/o neck pain x2 weeks with new onset of left shoulder pain that started today. Slight swelling and redness to left jaw area. Home health unable to give baseline regarding jaw area. Hx of CP   Temp: 99.1 oral BP: 107/75 RR: 18 Hr: 90 SpO2: 97, room air

## 2019-06-28 NOTE — ED Notes (Addendum)
Unable to obtain IV - Iv team consulted  

## 2019-06-29 ENCOUNTER — Encounter (HOSPITAL_COMMUNITY): Payer: Self-pay

## 2019-06-29 ENCOUNTER — Telehealth: Payer: Self-pay | Admitting: Family Medicine

## 2019-06-29 ENCOUNTER — Emergency Department (HOSPITAL_COMMUNITY): Payer: Medicare Other

## 2019-06-29 DIAGNOSIS — Z87440 Personal history of urinary (tract) infections: Secondary | ICD-10-CM | POA: Diagnosis not present

## 2019-06-29 DIAGNOSIS — N319 Neuromuscular dysfunction of bladder, unspecified: Secondary | ICD-10-CM | POA: Diagnosis present

## 2019-06-29 DIAGNOSIS — F411 Generalized anxiety disorder: Secondary | ICD-10-CM | POA: Diagnosis present

## 2019-06-29 DIAGNOSIS — F339 Major depressive disorder, recurrent, unspecified: Secondary | ICD-10-CM | POA: Diagnosis present

## 2019-06-29 DIAGNOSIS — E876 Hypokalemia: Secondary | ICD-10-CM | POA: Diagnosis not present

## 2019-06-29 DIAGNOSIS — L949 Localized connective tissue disorder, unspecified: Secondary | ICD-10-CM | POA: Diagnosis present

## 2019-06-29 DIAGNOSIS — K1121 Acute sialoadenitis: Principal | ICD-10-CM

## 2019-06-29 DIAGNOSIS — Z1624 Resistance to multiple antibiotics: Secondary | ICD-10-CM | POA: Diagnosis present

## 2019-06-29 DIAGNOSIS — F330 Major depressive disorder, recurrent, mild: Secondary | ICD-10-CM

## 2019-06-29 DIAGNOSIS — K219 Gastro-esophageal reflux disease without esophagitis: Secondary | ICD-10-CM | POA: Diagnosis present

## 2019-06-29 DIAGNOSIS — M609 Myositis, unspecified: Secondary | ICD-10-CM | POA: Diagnosis present

## 2019-06-29 DIAGNOSIS — Z7901 Long term (current) use of anticoagulants: Secondary | ICD-10-CM | POA: Diagnosis not present

## 2019-06-29 DIAGNOSIS — F329 Major depressive disorder, single episode, unspecified: Secondary | ICD-10-CM | POA: Diagnosis present

## 2019-06-29 DIAGNOSIS — I2699 Other pulmonary embolism without acute cor pulmonale: Secondary | ICD-10-CM

## 2019-06-29 DIAGNOSIS — K1120 Sialoadenitis, unspecified: Secondary | ICD-10-CM

## 2019-06-29 DIAGNOSIS — Z20828 Contact with and (suspected) exposure to other viral communicable diseases: Secondary | ICD-10-CM | POA: Diagnosis present

## 2019-06-29 DIAGNOSIS — G800 Spastic quadriplegic cerebral palsy: Secondary | ICD-10-CM | POA: Diagnosis present

## 2019-06-29 DIAGNOSIS — L03221 Cellulitis of neck: Secondary | ICD-10-CM | POA: Diagnosis present

## 2019-06-29 DIAGNOSIS — G808 Other cerebral palsy: Secondary | ICD-10-CM

## 2019-06-29 DIAGNOSIS — R569 Unspecified convulsions: Secondary | ICD-10-CM

## 2019-06-29 DIAGNOSIS — G801 Spastic diplegic cerebral palsy: Secondary | ICD-10-CM

## 2019-06-29 DIAGNOSIS — G894 Chronic pain syndrome: Secondary | ICD-10-CM | POA: Diagnosis present

## 2019-06-29 DIAGNOSIS — I10 Essential (primary) hypertension: Secondary | ICD-10-CM | POA: Diagnosis present

## 2019-06-29 DIAGNOSIS — Z825 Family history of asthma and other chronic lower respiratory diseases: Secondary | ICD-10-CM | POA: Diagnosis not present

## 2019-06-29 DIAGNOSIS — Z86711 Personal history of pulmonary embolism: Secondary | ICD-10-CM | POA: Diagnosis not present

## 2019-06-29 DIAGNOSIS — G40909 Epilepsy, unspecified, not intractable, without status epilepticus: Secondary | ICD-10-CM | POA: Diagnosis present

## 2019-06-29 LAB — CBC WITH DIFFERENTIAL/PLATELET
Abs Immature Granulocytes: 0.18 10*3/uL — ABNORMAL HIGH (ref 0.00–0.07)
Abs Immature Granulocytes: 0.14 10*3/uL — ABNORMAL HIGH (ref 0.00–0.07)
Basophils Absolute: 0 10*3/uL (ref 0.0–0.1)
Basophils Absolute: 0 10*3/uL (ref 0.0–0.1)
Basophils Relative: 0 %
Basophils Relative: 0 %
Eosinophils Absolute: 0 10*3/uL (ref 0.0–0.5)
Eosinophils Absolute: 0 10*3/uL (ref 0.0–0.5)
Eosinophils Relative: 0 %
Eosinophils Relative: 0 %
HCT: 32.6 % — ABNORMAL LOW (ref 36.0–46.0)
HCT: 32.6 % — ABNORMAL LOW (ref 36.0–46.0)
Hemoglobin: 10.3 g/dL — ABNORMAL LOW (ref 12.0–15.0)
Hemoglobin: 10.1 g/dL — ABNORMAL LOW (ref 12.0–15.0)
Immature Granulocytes: 1 %
Immature Granulocytes: 1 %
Lymphocytes Relative: 10 %
Lymphocytes Relative: 9 %
Lymphs Abs: 1.7 10*3/uL (ref 0.7–4.0)
Lymphs Abs: 1.3 10*3/uL (ref 0.7–4.0)
MCH: 30.7 pg (ref 26.0–34.0)
MCH: 29.6 pg (ref 26.0–34.0)
MCHC: 31.6 g/dL (ref 30.0–36.0)
MCHC: 31 g/dL (ref 30.0–36.0)
MCV: 97 fL (ref 80.0–100.0)
MCV: 95.6 fL (ref 80.0–100.0)
Monocytes Absolute: 1.4 10*3/uL — ABNORMAL HIGH (ref 0.1–1.0)
Monocytes Absolute: 1.2 10*3/uL — ABNORMAL HIGH (ref 0.1–1.0)
Monocytes Relative: 9 %
Monocytes Relative: 8 %
Neutro Abs: 12.7 10*3/uL — ABNORMAL HIGH (ref 1.7–7.7)
Neutro Abs: 12.5 10*3/uL — ABNORMAL HIGH (ref 1.7–7.7)
Neutrophils Relative %: 80 %
Neutrophils Relative %: 82 %
Platelets: 186 10*3/uL (ref 150–400)
Platelets: 230 10*3/uL (ref 150–400)
RBC: 3.36 MIL/uL — ABNORMAL LOW (ref 3.87–5.11)
RBC: 3.41 MIL/uL — ABNORMAL LOW (ref 3.87–5.11)
RDW: 13.9 % (ref 11.5–15.5)
RDW: 13.9 % (ref 11.5–15.5)
WBC: 16 10*3/uL — ABNORMAL HIGH (ref 4.0–10.5)
WBC: 15.2 10*3/uL — ABNORMAL HIGH (ref 4.0–10.5)
nRBC: 0 % (ref 0.0–0.2)
nRBC: 0 % (ref 0.0–0.2)

## 2019-06-29 LAB — COMPREHENSIVE METABOLIC PANEL
ALT: 16 U/L (ref 0–44)
AST: 14 U/L — ABNORMAL LOW (ref 15–41)
Albumin: 3.5 g/dL (ref 3.5–5.0)
Alkaline Phosphatase: 74 U/L (ref 38–126)
Anion gap: 9 (ref 5–15)
BUN: 16 mg/dL (ref 6–20)
CO2: 21 mmol/L — ABNORMAL LOW (ref 22–32)
Calcium: 8.6 mg/dL — ABNORMAL LOW (ref 8.9–10.3)
Chloride: 105 mmol/L (ref 98–111)
Creatinine, Ser: 0.49 mg/dL (ref 0.44–1.00)
GFR calc Af Amer: >60 mL/min (ref >60)
GFR calc non Af Amer: >60 mL/min (ref >60)
Glucose, Bld: 114 mg/dL — ABNORMAL HIGH (ref 70–99)
Potassium: 3.4 mmol/L — ABNORMAL LOW (ref 3.5–5.1)
Sodium: 135 mmol/L (ref 135–145)
Total Bilirubin: 0.8 mg/dL (ref 0.3–1.2)
Total Protein: 6.9 g/dL (ref 6.5–8.1)

## 2019-06-29 LAB — BASIC METABOLIC PANEL
Anion gap: 13 (ref 5–15)
BUN: 18 mg/dL (ref 6–20)
CO2: 17 mmol/L — ABNORMAL LOW (ref 22–32)
Calcium: 8.6 mg/dL — ABNORMAL LOW (ref 8.9–10.3)
Chloride: 107 mmol/L (ref 98–111)
Creatinine, Ser: 0.51 mg/dL (ref 0.44–1.00)
GFR calc Af Amer: >60 mL/min (ref >60)
GFR calc non Af Amer: >60 mL/min (ref >60)
Glucose, Bld: 119 mg/dL — ABNORMAL HIGH (ref 70–99)
Potassium: 4.6 mmol/L (ref 3.5–5.1)
Sodium: 137 mmol/L (ref 135–145)

## 2019-06-29 LAB — MRSA PCR SCREENING: MRSA by PCR: POSITIVE — AB

## 2019-06-29 LAB — SARS CORONAVIRUS 2 BY RT PCR (HOSPITAL ORDER, PERFORMED IN ~~LOC~~ HOSPITAL LAB): SARS Coronavirus 2: NEGATIVE

## 2019-06-29 LAB — LACTIC ACID, PLASMA: Lactic Acid, Venous: 0.8 mmol/L (ref 0.5–1.9)

## 2019-06-29 MED ORDER — QUETIAPINE FUMARATE 25 MG PO TABS
150.0000 mg | ORAL_TABLET | Freq: Every day | ORAL | Status: DC
  Administered 2019-06-29 – 2019-07-01 (×3): 150 mg via ORAL
  Filled 2019-06-29 (×3): qty 2

## 2019-06-29 MED ORDER — TOPIRAMATE 100 MG PO TABS
100.0000 mg | ORAL_TABLET | Freq: Every day | ORAL | Status: DC
  Administered 2019-06-29 – 2019-07-01 (×3): 100 mg via ORAL
  Filled 2019-06-29 (×3): qty 1

## 2019-06-29 MED ORDER — ONDANSETRON HCL 4 MG PO TABS: 4.0000 mg | ORAL_TABLET | Freq: Four times a day (QID) | ORAL | Status: DC | PRN

## 2019-06-29 MED ORDER — ONDANSETRON HCL 4 MG/2ML IJ SOLN: 4.0000 mg | Freq: Four times a day (QID) | INTRAMUSCULAR | Status: DC | PRN

## 2019-06-29 MED ORDER — LINACLOTIDE 145 MCG PO CAPS
145.0000 ug | ORAL_CAPSULE | Freq: Every day | ORAL | Status: DC
  Administered 2019-06-29 – 2019-07-02 (×4): 145 ug via ORAL
  Filled 2019-06-29 (×4): qty 1

## 2019-06-29 MED ORDER — SODIUM CHLORIDE 0.9% FLUSH
3.0000 mL | Freq: Two times a day (BID) | INTRAVENOUS | Status: DC
  Administered 2019-06-29 – 2019-07-01 (×5): 3 mL via INTRAVENOUS

## 2019-06-29 MED ORDER — VANCOMYCIN HCL IN DEXTROSE 1-5 GM/200ML-% IV SOLN
1000.0000 mg | Freq: Once | INTRAVENOUS | Status: AC
  Administered 2019-06-29: 1000 mg via INTRAVENOUS
  Filled 2019-06-29: qty 200

## 2019-06-29 MED ORDER — HYDROXYCHLOROQUINE SULFATE 200 MG PO TABS
200.0000 mg | ORAL_TABLET | Freq: Every day | ORAL | Status: DC
  Administered 2019-06-29 – 2019-07-02 (×4): 200 mg via ORAL
  Filled 2019-06-29 (×4): qty 1

## 2019-06-29 MED ORDER — ENSURE MAX PROTEIN PO LIQD
11.0000 [oz_av] | Freq: Two times a day (BID) | ORAL | Status: DC
  Administered 2019-06-29 – 2019-07-01 (×4): 11 [oz_av] via ORAL
  Filled 2019-06-29 (×8): qty 330

## 2019-06-29 MED ORDER — LAMOTRIGINE 100 MG PO TABS
100.0000 mg | ORAL_TABLET | Freq: Every day | ORAL | Status: DC
  Administered 2019-06-29 – 2019-07-01 (×3): 100 mg via ORAL
  Filled 2019-06-29 (×3): qty 1

## 2019-06-29 MED ORDER — BISACODYL 5 MG PO TBEC: 5.0000 mg | DELAYED_RELEASE_TABLET | Freq: Every day | ORAL | Status: DC | PRN

## 2019-06-29 MED ORDER — VANCOMYCIN HCL 500 MG IV SOLR
500.0000 mg | Freq: Two times a day (BID) | INTRAVENOUS | Status: DC
  Administered 2019-06-29 – 2019-07-02 (×6): 500 mg via INTRAVENOUS
  Filled 2019-06-29 (×7): qty 500

## 2019-06-29 MED ORDER — SODIUM CHLORIDE 0.9% FLUSH: 3.0000 mL | INTRAVENOUS | Status: DC | PRN

## 2019-06-29 MED ORDER — AMANTADINE HCL 100 MG PO CAPS
100.0000 mg | ORAL_CAPSULE | Freq: Every day | ORAL | Status: DC
  Administered 2019-06-29 – 2019-07-02 (×4): 100 mg via ORAL
  Filled 2019-06-29 (×4): qty 1

## 2019-06-29 MED ORDER — OXYBUTYNIN CHLORIDE ER 5 MG PO TB24
5.0000 mg | ORAL_TABLET | Freq: Every day | ORAL | Status: DC
  Administered 2019-06-29 – 2019-07-02 (×4): 5 mg via ORAL
  Filled 2019-06-29 (×4): qty 1

## 2019-06-29 MED ORDER — FLUOXETINE HCL 20 MG PO CAPS
80.0000 mg | ORAL_CAPSULE | Freq: Every day | ORAL | Status: DC
  Administered 2019-06-29 – 2019-07-02 (×4): 80 mg via ORAL
  Filled 2019-06-29 (×4): qty 4

## 2019-06-29 MED ORDER — IOHEXOL 300 MG/ML  SOLN
75.0000 mL | Freq: Once | INTRAMUSCULAR | Status: AC | PRN
  Administered 2019-06-29: 75 mL via INTRAVENOUS

## 2019-06-29 MED ORDER — PIPERACILLIN-TAZOBACTAM 3.375 G IVPB 30 MIN
3.3750 g | Freq: Once | INTRAVENOUS | Status: AC
  Administered 2019-06-29: 3.375 g via INTRAVENOUS
  Filled 2019-06-29: qty 50

## 2019-06-29 MED ORDER — BACLOFEN 20 MG PO TABS
20.0000 mg | ORAL_TABLET | Freq: Four times a day (QID) | ORAL | Status: DC
  Administered 2019-06-29 – 2019-07-02 (×9): 20 mg via ORAL
  Filled 2019-06-29 (×4): qty 1
  Filled 2019-06-29: qty 2
  Filled 2019-06-29 (×5): qty 1

## 2019-06-29 MED ORDER — PANTOPRAZOLE SODIUM 40 MG PO TBEC
40.0000 mg | DELAYED_RELEASE_TABLET | Freq: Every day | ORAL | Status: DC
  Administered 2019-06-29 – 2019-07-02 (×4): 40 mg via ORAL
  Filled 2019-06-29 (×4): qty 1

## 2019-06-29 MED ORDER — BUSPIRONE HCL 5 MG PO TABS
5.0000 mg | ORAL_TABLET | Freq: Two times a day (BID) | ORAL | Status: DC
  Administered 2019-06-29 – 2019-07-02 (×7): 5 mg via ORAL
  Filled 2019-06-29 (×7): qty 1

## 2019-06-29 MED ORDER — GUAIFENESIN ER 600 MG PO TB12: 600.0000 mg | ORAL_TABLET | Freq: Two times a day (BID) | ORAL | Status: DC | PRN

## 2019-06-29 MED ORDER — ACETAMINOPHEN 325 MG PO TABS
650.0000 mg | ORAL_TABLET | Freq: Four times a day (QID) | ORAL | Status: DC | PRN
  Administered 2019-06-29 – 2019-07-01 (×2): 650 mg via ORAL
  Filled 2019-06-29 (×2): qty 2

## 2019-06-29 MED ORDER — POLYETHYLENE GLYCOL 3350 17 G PO PACK: 17.0000 g | PACK | Freq: Every day | ORAL | Status: DC | PRN

## 2019-06-29 MED ORDER — BOOST HIGH PROTEIN PO LIQD
1.0000 | Freq: Two times a day (BID) | ORAL | Status: DC
  Filled 2019-06-29 (×2): qty 237

## 2019-06-29 MED ORDER — ACETAMINOPHEN 650 MG RE SUPP: 650.0000 mg | Freq: Four times a day (QID) | RECTAL | Status: DC | PRN

## 2019-06-29 MED ORDER — VANCOMYCIN HCL IN DEXTROSE 1-5 GM/200ML-% IV SOLN: 1000.0000 mg | INTRAVENOUS | Status: DC

## 2019-06-29 MED ORDER — SODIUM CHLORIDE 0.9 % IV SOLN
250.0000 mL | INTRAVENOUS | Status: DC | PRN
  Administered 2019-06-29: 18:00:00 250 mL via INTRAVENOUS

## 2019-06-29 MED ORDER — OXYCODONE HCL 5 MG PO TABS
10.0000 mg | ORAL_TABLET | Freq: Four times a day (QID) | ORAL | Status: DC | PRN
  Administered 2019-06-29 – 2019-07-01 (×8): 10 mg via ORAL
  Filled 2019-06-29 (×8): qty 2

## 2019-06-29 MED ORDER — SODIUM CHLORIDE 0.9% FLUSH
3.0000 mL | Freq: Two times a day (BID) | INTRAVENOUS | Status: DC
  Administered 2019-06-29 – 2019-07-01 (×3): 3 mL via INTRAVENOUS

## 2019-06-29 MED ORDER — SODIUM CHLORIDE 0.9 % IV SOLN: 3.0000 g | Freq: Once | INTRAVENOUS | Status: DC

## 2019-06-29 MED ORDER — PIPERACILLIN-TAZOBACTAM 3.375 G IVPB
3.3750 g | Freq: Three times a day (TID) | INTRAVENOUS | Status: DC
  Administered 2019-06-29 – 2019-07-02 (×9): 3.375 g via INTRAVENOUS
  Filled 2019-06-29 (×10): qty 50

## 2019-06-29 MED ORDER — TIZANIDINE HCL 4 MG PO TABS
4.0000 mg | ORAL_TABLET | Freq: Two times a day (BID) | ORAL | Status: DC
  Administered 2019-06-29 – 2019-07-02 (×7): 4 mg via ORAL
  Filled 2019-06-29 (×7): qty 1

## 2019-06-29 MED ORDER — NALOXEGOL OXALATE 25 MG PO TABS
25.0000 mg | ORAL_TABLET | Freq: Every day | ORAL | Status: DC
  Administered 2019-06-29 – 2019-07-02 (×4): 25 mg via ORAL
  Filled 2019-06-29 (×4): qty 1

## 2019-06-29 MED ORDER — BUPRENORPHINE HCL 900 MCG BU FILM: 900.0000 ug | ORAL_FILM | Freq: Two times a day (BID) | BUCCAL | Status: DC

## 2019-06-29 MED ORDER — VENLAFAXINE HCL ER 37.5 MG PO CP24
37.5000 mg | ORAL_CAPSULE | Freq: Every day | ORAL | Status: DC
  Administered 2019-06-29 – 2019-07-02 (×4): 37.5 mg via ORAL
  Filled 2019-06-29 (×4): qty 1

## 2019-06-29 MED ORDER — CLONAZEPAM 0.125 MG PO TBDP
0.7500 mg | ORAL_TABLET | Freq: Three times a day (TID) | ORAL | Status: DC
  Administered 2019-06-29 – 2019-06-30 (×6): 0.75 mg via ORAL
  Filled 2019-06-29 (×5): qty 6

## 2019-06-29 MED ORDER — SODIUM CHLORIDE (PF) 0.9 % IJ SOLN
INTRAMUSCULAR | Status: AC
  Administered 2019-06-29: 13:00:00
  Filled 2019-06-29: qty 50

## 2019-06-29 MED ORDER — RIVAROXABAN 10 MG PO TABS
10.0000 mg | ORAL_TABLET | Freq: Every day | ORAL | Status: DC
  Administered 2019-06-29 – 2019-07-02 (×4): 10 mg via ORAL
  Filled 2019-06-29 (×4): qty 1

## 2019-06-29 MED ORDER — MELATONIN 5 MG PO TABS
5.0000 mg | ORAL_TABLET | Freq: Every day | ORAL | Status: DC
  Administered 2019-06-29 – 2019-07-01 (×3): 5 mg via ORAL
  Filled 2019-06-29 (×4): qty 1

## 2019-06-29 NOTE — H&P (Signed)
History and Physical    [name] [name] GBT:[id number] DOB: 28-Sep-1976 DOA: 06/28/2019  PCP: Forrest Moron, MD   Patient coming from: Home   Chief Complaint: Neck pain   HPI: [name] [name] is a 43 y.o. female with medical history significant for cerebral palsy, spastic quadriplegia, neurogenic bladder, decubitus ulcers, recurrent infections with MDR organisms, chronic pain, depression, anxiety, history of PE on Xarelto, and seizure disorder, now presenting to the emergency department for evaluation of neck pain.  Patient reports that she developed some pain in her neck couple weeks ago, described as severe progressively worsening.  She is not sure if she has had a fever or not.  She denies any cough or recent shortness of breath.  ED Course: Upon arrival to the ED, patient is found to be afebrile, saturating well on room air, and with systolic blood pressure of 93.  Chemistry panel is notable for bicarbonate of 17 and CBC features a leukocytosis to 16,000.  CT neck soft tissue reveals asymmetric enlargement and hyperenhancement of the left parotid consistent with acute parotitis.  Patient was treated with a liter of normal saline, vancomycin, Unasyn, and fentanyl in the ED.  Hospitalists are asked to admit.  Review of Systems:  All other systems reviewed and apart from HPI, are negative.  Past Medical History:  Diagnosis Date  . Acute respiratory failure with hypoxia ([place])   . Anemia   . Cellulitis 12/22/2014  . Cerebral palsy ([name])    Spastic Cerebral palsy, mentally intact  . Contracture, joint, multiple sites    Electric wheelchair, uses left hand to operate chair.   . Depression   . [name]   . Dysarthria   . GERD (gastroesophageal reflux disease)   . Gram positive sepsis ([place]) 10/24/2014  . HCAP (healthcare-associated pneumonia)    2015, 2018  . History of endometriosis 10/2012   OBGYN [facility]: Laparoscopic Endometrial Ablation, TVH,  . History of  recurrent UTIs   . Hypertension   . Incontinent of feces 03/11/2016  . Intracervical pessary 05/07/2011   Overview:  Overview:  Placed by [facility]YN 04/30/11 to treat DUB and Endometriosis.  She is seen at GYN clinic at [facility] but was considered a poor surgical candidate and referred her to [facility].  For DUB she had pelvic ultrasound that showed thin stripe and she had endometrial Bx by Dr Nori Riis in [facility], which was negative.     . Macroscopic hematuria 03/10/2013   status post replacement of suprapubic tube 03/04/2013   . Migraine   . OCD (obsessive compulsive disorder)   . Parapneumonic effusion 10/25/2014  . Presence of intrathecal baclofen pump 03/07/2014   R LQ. Insertion T10.    [name] Kitchen Psychiatric illness 10/31/2014  . Recurrent pulmonary embolism ([name])    Pt has history of recurrent PE and is on lifelong coumadin.   . Seizures ([place]) 02/12/2016  . Spasticity 01/05/2014  . Transient alteration of awareness, recurrent 09/08/2006   Recurrent episodes where Sharis  loses contact with her world and that have been studied thoroughly and appear nonepileptic in nature per Dr Krista Blue (neurology) The patient has had episodes starting in 2007 that initially began 1-4 times a day during which time she would have periods of unawareness followed by confusion. This continuous video EEG monitoring performed from October 8-12, 2007. Had   . Urethra dilated and patulous 07/03/2015   Patulous, Dilated Urethra. 78m balloon on a 22-Fr catheter hoping this would prevent the bladder  spasm from pushing the balloon down the urethra (Dr Amalia Hailey, Urology [facility], July 2016)     Past Surgical History:  Procedure Laterality Date  . ABDOMINAL HYSTERECTOMY    . APPENDECTOMY    . BACLOFEN PUMP REFILL     x 3 times  . CARPAL TUNNEL RELEASE  08/2008   Dr Daylene Katayama  . CESAREAN SECTION     x 2  . CHOLECYSTECTOMY  10/14/2015   Procedure: LAPAROSCOPIC CHOLECYSTECTOMY;  Surgeon: Georganna Skeans, MD;  Location: [facility];  Service: General;;   . COLPOSCOPY  06/2000  . INTRAUTERINE DEVICE INSERTION  04/30/11   Inserted by Parrott for endometriosis  . LAPAROSCOPIC ASSISTED VAGINAL HYSTERECTOMY  10/27/2012  . MULTIPLE EXTRACTIONS WITH ALVEOLOPLASTY Bilateral 07/19/2013   Procedure: EXTRACTIONS #4, 5,63,89,37;  Surgeon: Isac Caddy, DDS;  Location: [place];  Service: Oral Surgery;  Laterality: Bilateral;  . NEUROMA SURGERY Left    anterior and posterior intersosseous  . PAIN PUMP IMPLANTATION N/A 03/07/2014   Procedure: baclofen pump revision/replacement and Catheter connection replacement;  Surgeon: Ophelia Charter, MD;  Location: [place] [facility];  Service: Neurosurgery;  Laterality: N/A;  baclofen pump revision/replacement and Catheter connection replacement  . PROGRAMABLE BACLOFEN PUMP REVISION  03/17/14   Battery Replacement   . TUBAL LIGATION  2003  . URETHRA SURGERY    . WRIST SURGERY  06/2010   Dr Nicoletta Dress, hand surgeon, [name]     reports that she has never smoked. She has never used smokeless tobacco. She reports current alcohol use of about 1.0 standard drinks of alcohol per week. She reports that she does not use drugs.  Allergies  Allergen Reactions  . Morphine Dermatitis and Other (See Comments)    Skin turned red     Family History  Problem Relation Age of Onset  . Asthma Father   . Colon cancer Maternal Grandmother        Died in her 42's  . Breast cancer Paternal Grandmother        Died in her 48's  . Heart attack Maternal Grandfather        Died in his 34's  . Alzheimer's disease Paternal Grandfather        Died in his 68's  . Breast cancer Mother   . Stomach cancer Neg Hx      Prior to Admission medications   Medication Sig Start Date End Date Taking? Authorizing Provider  acetaminophen (TYLENOL) 500 MG tablet Take 1,000 mg by mouth 2 (two) times daily as needed for headache (pain).   Yes [provider]  amantadine (SYMMETREL) 100 MG capsule Take 100 mg by mouth daily.    Yes  [provider]  azelastine (ASTELIN) 0.1 % nasal spray Place 2 sprays into both nostrils 2 (two) times daily. Use in each nostril as directed   Yes [provider]  baclofen (LIORESAL) 20 MG tablet Take 1 tablet (20 mg total) by mouth 4 (four) times daily. For bladder spasms 12/28/18  Yes Rockwell Germany, NP  Buprenorphine HCl (BELBUCA) 900 MCG FILM Place 900 mcg inside cheek every 12 (twelve) hours.   Yes [provider]  busPIRone (BUSPAR) 5 MG tablet Take 1 tablet (5 mg total) by mouth 2 (two) times daily. 06/23/19  Yes Forrest Moron, MD  clonazePAM (KLONOPIN) 0.5 MG tablet Take 1.5 tablets (0.75 mg total) by mouth 3 (three) times daily. 11/27/17  Yes Mariel Aloe, MD  feeding supplement (BOOST HIGH PROTEIN) LIQD Take  1 Container by mouth 2 (two) times daily between meals.   Yes [provider]  FLUoxetine (PROZAC) 40 MG capsule Take 80 mg by mouth daily.   Yes [provider]  hydroxychloroquine (PLAQUENIL) 200 MG tablet Take 200 mg by mouth daily.   Yes [provider]  lamoTRIgine (LAMICTAL) 100 MG tablet Take 100 mg by mouth at bedtime.   Yes [provider]  linaclotide (LINZESS) 145 MCG CAPS capsule Take 145 mcg by mouth daily before breakfast.    Yes [provider]  Melatonin 5 MG TABS Take 5 mg by mouth at bedtime.    Yes [provider]  naloxegol oxalate (MOVANTIK) 25 MG TABS tablet Take 25 mg by mouth daily.   Yes [provider]  omeprazole (PRILOSEC) 20 MG capsule Take 1 capsule (20 mg total) by mouth daily. 01/04/19  Yes Sagardia, Ines Bloomer, MD  oxybutynin (DITROPAN-XL) 5 MG 24 hr tablet Take 5 mg by mouth daily.   Yes [provider]  QUEtiapine (SEROQUEL) 50 MG tablet Take 150 mg by mouth at bedtime.  01/08/19  Yes [provider]  rivaroxaban (XARELTO) 10 MG TABS tablet Take 10 mg by mouth daily.    Yes [provider]  tiZANidine (ZANAFLEX) 4 MG capsule Take  1 capsule (4 mg total) by mouth 2 (two) times daily. 12/28/18  Yes Rockwell Germany, NP  topiramate (TOPAMAX) 100 MG tablet Take 1 tablet at bedtime Patient taking differently: Take 100 mg by mouth at bedtime.  02/10/19  Yes Rockwell Germany, NP  venlafaxine XR (EFFEXOR-XR) 37.5 MG 24 hr capsule Take 37.5 mg by mouth daily. 01/08/19  Yes [provider]  acetaminophen (TYLENOL 8 HOUR) 650 MG CR tablet Take 1 tablet (650 mg total) by mouth every 8 (eight) hours as needed. Patient not taking: Reported on 06/28/2019 07/08/18   Varney Biles, MD  bisacodyl (DULCOLAX) 5 MG EC tablet Take 15 mg by mouth daily as needed (constipation).    [provider]  feeding supplement, ENSURE ENLIVE, (ENSURE ENLIVE) LIQD Take 237 mLs by mouth 2 (two) times daily between meals. Patient not taking: Reported on 06/23/2019 05/30/18   Kayleen Memos, DO  guaiFENesin (MUCINEX) 600 MG 12 hr tablet Take 600 mg by mouth 2 (two) times daily as needed for cough or to loosen phlegm.     [provider]  NARCAN 4 MG/0.1ML LIQD nasal spray kit Place 4 mg into the nose once as needed (severe opioid overdose).  02/24/19   [provider]  ondansetron (ZOFRAN) 4 MG tablet Take 1 tablet (4 mg total) by mouth every 6 (six) hours as needed for nausea. 11/19/18   Georgette Shell, MD  Oxycodone HCl 10 MG TABS Take 10 mg by mouth every 6 (six) hours as needed (pain).    [provider]  polyethylene glycol powder (GLYCOLAX/MIRALAX) powder Take 1  1/2 dose  daily in 12 ounces of fluid every day. Patient taking differently: Take 0.5-1 Containers by mouth daily as needed for moderate constipation. Take 1  1/2 dose  daily in 12 ounces of fluid every day. 01/01/17   Esterwood, [name] S, PA-C  Skin Protectants, Misc. (EUCERIN) cream Apply topically as needed for wound care. Patient taking differently: Apply 1 application topically daily as needed for wound care.  02/05/19   Cherre Robins, PA-C     Physical Exam: Vitals:   06/28/19 2103 06/28/19 2105 06/28/19 2114 06/29/19 0226  BP:   93/63  133/67  Pulse:   88 96  Resp:   (!) 25 (!) 27  Temp:   98.2 F (36.8 C)   TempSrc:   Oral   SpO2: 97%  98% 98%  Weight:  49.9 kg    Height:  _0  (1.575 m)      Constitutional: NAD, calm  Eyes: PERTLA, lids and conjunctivae normal ENMT: Mucous membranes are moist. Posterior pharynx clear of any exudate or lesions.   Neck: supple, no masses, no thyromegaly Respiratory: clear to auscultation bilaterally, no wheezing, no crackles. No accessory muscle use.  Cardiovascular: S1 & S2 heard, regular rate and rhythm. No significant JVD. Abdomen: No distension, no tenderness, soft. Bowel sounds normal.  Musculoskeletal: no clubbing / cyanosis. Flexion contractures.   Skin: Edema, tenderness, heat, and faint erythema involving angle of left mandible without fluctuance or drainage. Warm, dry, well-perfused. Neurologic: Dysarthria. Quadriplegia.  Psychiatric:  Alert and oriented to person, place, and situation. Calm, cooperative.    Labs on Admission: I have personally reviewed following labs and imaging studies  CBC: Recent Labs  Lab 06/29/19 0038  WBC 16.0*  NEUTROABS 12.7*  HGB 10.3*  HCT 32.6*  MCV 97.0  PLT 546   Basic Metabolic Panel: Recent Labs  Lab 06/28/19 2349  NA 137  K 4.6  CL 107  CO2 17*  GLUCOSE 119*  BUN 18  CREATININE 0.51  CALCIUM 8.6*   GFR: Estimated Creatinine Clearance: 70 mL/min (by C-G formula based on SCr of 0.51 mg/dL). Liver Function Tests: No results for input(s): AST, ALT, ALKPHOS, BILITOT, PROT, ALBUMIN in the last 168 hours. No results for input(s): LIPASE, AMYLASE in the last 168 hours. No results for input(s): AMMONIA in the last 168 hours. Coagulation Profile: No results for input(s): INR, PROTIME in the last 168 hours. Cardiac Enzymes: No results for input(s): CKTOTAL, CKMB, CKMBINDEX, TROPONINI in the last 168 hours. BNP (last 3  results) No results for input(s): PROBNP in the last 8760 hours. HbA1C: No results for input(s): HGBA1C in the last 72 hours. CBG: No results for input(s): GLUCAP in the last 168 hours. Lipid Profile: No results for input(s): CHOL, HDL, LDLCALC, TRIG, CHOLHDL, LDLDIRECT in the last 72 hours. Thyroid Function Tests: No results for input(s): TSH, T4TOTAL, FREET4, T3FREE, THYROIDAB in the last 72 hours. Anemia Panel: No results for input(s): VITAMINB12, FOLATE, FERRITIN, TIBC, IRON, RETICCTPCT in the last 72 hours. Urine analysis:    Component Value Date/Time   COLORURINE YELLOW 05/28/2019 1451   APPEARANCEUR CLOUDY (A) 05/28/2019 1451   LABSPEC 1.011 05/28/2019 1451   PHURINE 6.0 05/28/2019 1451   GLUCOSEU NEGATIVE 05/28/2019 1451   HGBUR LARGE (A) 05/28/2019 1451   HGBUR large 10/03/2010 1530   BILIRUBINUR NEGATIVE 05/28/2019 1451   BILIRUBINUR neg 03/15/2011 1719   KETONESUR NEGATIVE 05/28/2019 1451   PROTEINUR 100 (A) 05/28/2019 1451   UROBILINOGEN 1.0 10/08/2015 2348   NITRITE NEGATIVE 05/28/2019 1451   LEUKOCYTESUR LARGE (A) 05/28/2019 1451   LEUKOCYTESUR mod 01/26/2014   Sepsis Labs: _1 (procalcitonin:4,lacticidven:4) )No results found for this or any previous visit (from the past 240 hour(s)).   Radiological Exams on Admission: Ct Soft Tissue Neck W Contrast  Result Date: 06/29/2019 CLINICAL DATA:  Initial evaluation for acute neck pain for 2 weeks, pain in left shoulder with redness and swelling to left jaw. EXAM: CT NECK WITH CONTRAST TECHNIQUE: Multidetector CT imaging of the neck was performed using the standard protocol following the bolus administration of intravenous contrast. CONTRAST:  29m OMNIPAQUE IOHEXOL 300 MG/ML  SOLN COMPARISON:  None available. FINDINGS: Pharynx and larynx: Examination mildly limited by streak artifact from extensive spinal fusion hardware. Oral cavity within normal limits without discrete mass or loculated collection. No acute  abnormality about the dentition. No base of tongue lesion. Palatine tonsils not well visualized, and may be absent. Mild asymmetric fullness with mucosal edema within the left pharynx, favored to be secondary in nature to the acute inflammatory process within the left face/neck. Nasopharynx within normal limits. No visible retropharyngeal collection. Epiglottis within normal limits. Vallecula clear. Remainder of the hypopharynx and supraglottic larynx within normal limits. Glottis symmetric and normal. Subglottic airway clear. Salivary glands: Asymmetric enlargement and hyperenhancement of the left parotid gland, suggesting acute parotitis. Associated swelling with inflammatory stranding throughout the adjacent left parotid space, extending into the left masticator and submandibular spaces. Swelling with hazy inflammatory stranding extends inferiorly along the left neck, suggesting associated regional cellulitis. No obstructive stone seen within Stensen's duct. No intraglandular abscess or other complication. Right parotid and submandibular glands are within normal limits. Mild hazy stranding about the left submandibular gland felt to be secondary in nature, with the gland itself felt to be within normal limits. Thyroid: Partially visualized thyroid grossly normal, although evaluation somewhat limited by streak artifact by adjacent spinal fusion hardware. Lymph nodes: Prominent and mildly enlarged left level II lymph nodes measure up to 11 mm, likely reactive. No other pathologically enlarged lymph nodes identified within the neck. Vascular: Normal intravascular enhancement seen throughout the neck. Limited intracranial: Unremarkable. Visualized orbits: Visualized globes and orbital soft tissues within normal limits. Mastoids and visualized paranasal sinuses: Visualized paranasal sinuses are clear. Visualized mastoids and middle ear cavities are well pneumatized and free of fluid. Skeleton: No acute osseous finding.  No discrete lytic or blastic osseous lesions. Prior ACDF at C4 through T1. Extensive posterior fusion throughout the visualized cervicothoracic spine. Upper chest: Visualized upper chest demonstrates no acute finding. Partially visualized lungs are grossly clear. Other: None. IMPRESSION: 1. Asymmetric enlargement and hyperenhancement involving the left parotid gland, consistent with acute parotitis. Diffuse swelling with inflammatory stranding within the adjacent left face/neck compatible with associated regional cellulitis. No obstructive stone, abscess, or other complication identified. 2. Prominent and mildly enlarged left level II lymph nodes, likely reactive. Electronically Signed   By: BJeannine BogaM.D.   On: 06/29/2019 01:48    EKG: Not performed.   Assessment/Plan   1. Acute parotitis  - Presents with pain, swelling, and redness involving left neck and jaw, noted to have leukocytosis to 16k, and CT findings consistent with acute parotitis with local cellulitis and myositis, but no stone, abscess, or airway compromise  - She has hx of MDR organisms and recent antibiotic use  - She was treated with vancomycin and Unasyn in ED  - Continue empiric broad-spectrum antibiotics, follow clinical course    2. Cerebral palsy; spastic quadriplegia  - Continue amantadine and baclofen    3. Chronic pain  - Continue oxycodone, Zanaflex, buprenorphine   4. Depression; anxiety  - Continue Effexor, Buspar, Prozac, Seroquel, and Klonopin    5. History of PE  - Continue Xarelto    6. Connective tissue disorder   - Continue Plaquenil    7. Seizure disorder  - Continue Topomax and Lamictal     PPE: Mask, face shield  DVT prophylaxis: Xarelto  Code Status: Full  Family Communication: Discussed with patient  Consults called: None Admission status: Observation     TChristia Reading  Criss Rosales, MD [facility] Pager [phone]  If 7PM-7AM, please contact night-coverage [email or web address]  [facility]  06/29/2019, 2:41 AM

## 2019-06-29 NOTE — ED Notes (Signed)
ED TO INPATIENT HANDOFF REPORT  Name/Age/Gender Mindi SlickerJudith Oldenkamp 43 y.o. female  Code Status    Code Status Orders  (From admission, onward)         Start     Ordered   06/29/19 0338  Full code  Continuous     06/29/19 0337        Code Status History    Date Active Date Inactive Code Status Order ID Comments User Context   02/14/2019 2354 02/18/2019 1603 Full Code [id number]  Eduard ClosKakrakandy, Arshad N, MD ED   11/18/2018 1644 11/19/2018 2325 Full Code [id number]  Jonah BlueYates, [name], MD ED   08/19/2018 1756 08/22/2018 1955 Full Code [id number]  Leatha GildingGherghe, Costin M, MD Inpatient   05/25/2018 2059 05/29/2018 1956 Full Code [id number]  Pearson GrippeKim, James, MD ED   11/24/2017 0313 11/27/2017 1910 Full Code [id number]  Briscoe Deutscherpyd, Timothy S, MD ED   10/09/2015 0216 10/30/2015 2103 Full Code [id number]  Minor, Vilinda BlanksWilliam S, NP ED   06/09/2015 0249 06/14/2015 1958 Full Code [id number]  Araceli Boucheumley, [place] N, DO Inpatient   10/22/2014 1709 10/28/2014 1556 Full Code [id number]  Abram SanderAdamo, Elena M, MD Inpatient   03/10/2014 2127 03/15/2014 1834 Full Code [id number]  Latrelle DodrillMcIntyre, Brittany J, MD Inpatient   03/07/2014 1521 03/08/2014 2000 Full Code [id number]  Tressie StalkerJenkins, Jeffrey, MD Inpatient   Advance Care Planning Activity      Home/SNF/Other Home  Chief Complaint Left Shoulder and Neck Pain  Level of Care/Admitting Diagnosis ED Disposition    ED Disposition Condition Comment   Admit  [facility] [[id number]]  Level of Care: Med-Surg [16]  Covid Evaluation: Confirmed COVID Negative  Diagnosis: Parotiditis [202900]  Admitting Physician: Marzetta BoardSPONGBERG, CHRISTOPHER N [[id number]]  Attending Physician: Marzetta BoardSPONGBERG, CHRISTOPHER N [[id number]]  Estimated length of stay: past midnight tomorrow  Certification:: I certify this patient will need inpatient services for at least 2 midnights  PT Class (Do Not Modify): Inpatient [101]  PT Acc Code (Do Not Modify): Private [1]       Medical History Past Medical History:  Diagnosis  Date  . Acute respiratory failure with hypoxia ([name])   . Anemia   . Cellulitis 12/22/2014  . Cerebral palsy ([name])    Spastic Cerebral palsy, mentally intact  . Contracture, joint, multiple sites    Electric wheelchair, uses left hand to operate chair.   . Depression   . [name]   . Dysarthria   . GERD (gastroesophageal reflux disease)   . Gram positive sepsis ([name]) 10/24/2014  . HCAP (healthcare-associated pneumonia)    2015, 2018  . History of endometriosis 10/2012   OBGYN [facility]: Laparoscopic Endometrial Ablation, TVH,  . History of recurrent UTIs   . Hypertension   . Incontinent of feces 03/11/2016  . Intracervical pessary 05/07/2011   Overview:  Overview:  [facility] to treat DUB and Endometriosis.  [facility] but was considered a poor surgical candidate and [facility]  For DUB she had pelvic ultrasound that showed thin stripe and she had endometrial Bx by Dr Jennette KettleNeal in GYN clinic, which was negative.     . Macroscopic hematuria 03/10/2013   status post replacement of suprapubic tube 03/04/2013   . Migraine   . OCD (obsessive compulsive disorder)   . Parapneumonic effusion 10/25/2014  . Presence of intrathecal baclofen pump 03/07/2014   R LQ. Insertion T10.    [name] Kitchen. Psychiatric illness 10/31/2014  .  Recurrent pulmonary embolism ([name])    Pt has history of recurrent PE and is on lifelong coumadin.   . Seizures ([name]) 02/12/2016  . Spasticity 01/05/2014  . Transient alteration of awareness, recurrent 09/08/2006   Recurrent episodes where [name]  loses contact with her world and that have been studied thoroughly and appear nonepileptic in nature per Dr Terrace ArabiaYan (neurology) The patient has had episodes starting in 2007 that initially began 1-4 times a day during which time she would have periods of unawareness followed by confusion. This continuous video EEG monitoring performed from October 8-12, 2007. Had   . Urethra  dilated and patulous 07/03/2015   Patulous, Dilated Urethra. 5mL balloon on a 22-Fr catheter hoping this would prevent the bladder spasm from pushing the balloon down the urethra (Dr Logan BoresEvans, Urology [facility], July 2016)     Allergies Allergies  Allergen Reactions  . Morphine Dermatitis and Other (See Comments)    Skin turned red     IV Location/Drains/Wounds Patient Lines/Drains/Airways Status   Active Line/Drains/Airways    Name:   Placement date:   Placement time:   Site:   Days:   Peripheral IV 06/28/19 Left Arm   06/28/19    2314    Arm   1   Suprapubic Catheter Latex 20 Fr.   09/06/18    1505    Latex   296          Labs/Imaging Results for orders placed or performed during the hospital encounter of 06/28/19 (from the past 48 hour(s))  Basic metabolic panel     Status: Abnormal   Collection Time: 06/28/19 11:49 PM  Result Value Ref Range   Sodium 137 135 - 145 mmol/L   Potassium 4.6 3.5 - 5.1 mmol/L   Chloride 107 98 - 111 mmol/L   CO2 17 (L) 22 - 32 mmol/L   Glucose, Bld 119 (H) 70 - 99 mg/dL   BUN 18 6 - 20 mg/dL   Creatinine, Ser 1.610.51 0.44 - 1.00 mg/dL   Calcium 8.6 (L) 8.9 - 10.3 mg/dL   GFR calc non Af Amer >60 >60 mL/min   GFR calc Af Amer >60 >60 mL/min   Anion gap 13 5 - 15    Comment: Performed at [facility], [address] [place], [place] [address]  CBC with Differential/Platelet     Status: Abnormal   Collection Time: 06/29/19 12:38 AM  Result Value Ref Range   WBC 16.0 (H) 4.0 - 10.5 K/uL   RBC 3.36 (L) 3.87 - 5.11 MIL/uL   Hemoglobin 10.3 (L) 12.0 - 15.0 g/dL   HCT 54.032.6 (L) 98.136.0 - 19.146.0 %   MCV 97.0 80.0 - 100.0 fL   MCH 30.7 26.0 - 34.0 pg   MCHC 31.6 30.0 - 36.0 g/dL   RDW 47.813.9 29.511.5 - 62.115.5 %   Platelets 186 150 - 400 K/uL   nRBC 0.0 0.0 - 0.2 %   Neutrophils Relative % 80 %   Neutro Abs 12.7 (H) 1.7 - 7.7 K/uL   Lymphocytes Relative 10 %   Lymphs Abs 1.7 0.7 - 4.0 K/uL   Monocytes Relative 9 %   Monocytes Absolute 1.4 (H) 0.1  - 1.0 K/uL   Eosinophils Relative 0 %   Eosinophils Absolute 0.0 0.0 - 0.5 K/uL   Basophils Relative 0 %   Basophils Absolute 0.0 0.0 - 0.1 K/uL   Immature Granulocytes 1 %   Abs Immature Granulocytes 0.18 (H) 0.00 - 0.07  K/uL    Comment: Performed at [facility], [address] [place], [place] [address]  SARS Coronavirus 2 (CEPHEID - Performed in [facility] hospital lab), Hosp Order     Status: None   Collection Time: 06/29/19  2:12 AM   Specimen: Nasopharyngeal Swab  Result Value Ref Range   SARS Coronavirus 2 NEGATIVE NEGATIVE    Comment: (NOTE) If result is NEGATIVE SARS-CoV-2 target nucleic acids are NOT DETECTED. The SARS-CoV-2 RNA is generally detectable in upper and lower  respiratory specimens during the acute phase of infection. The lowest  concentration of SARS-CoV-2 viral copies this assay can detect is 250  copies / mL. A negative result does not preclude SARS-CoV-2 infection  and should not be used as the sole basis for treatment or other  patient management decisions.  A negative result may occur with  improper specimen collection / handling, submission of specimen other  than nasopharyngeal swab, presence of viral mutation(s) within the  areas targeted by this assay, and inadequate number of viral copies  (<250 copies / mL). A negative result must be combined with clinical  observations, patient history, and epidemiological information. If result is POSITIVE SARS-CoV-2 target nucleic acids are DETECTED. The SARS-CoV-2 RNA is generally detectable in upper and lower  respiratory specimens dur ing the acute phase of infection.  Positive  results are indicative of active infection with SARS-CoV-2.  Clinical  correlation with patient history and other diagnostic information is  necessary to determine patient infection status.  Positive results do  not rule out bacterial infection or co-infection with other viruses. If result is PRESUMPTIVE  POSTIVE SARS-CoV-2 nucleic acids MAY BE PRESENT.   A presumptive positive result was obtained on the submitted specimen  and confirmed on repeat testing.  While 2019 novel coronavirus  (SARS-CoV-2) nucleic acids may be present in the submitted sample  additional confirmatory testing may be necessary for epidemiological  and / or clinical management purposes  to differentiate between  SARS-CoV-2 and other Sarbecovirus currently known to infect humans.  If clinically indicated additional testing with an alternate test  methodology [phone]([id number]) is advised. The SARS-CoV-2 RNA is generally  detectable in upper and lower respiratory sp ecimens during the acute  phase of infection. The expected result is Negative. Fact Sheet for Patients:  BoilerBrush.com.cyhttps://www.fda.gov/media/136312/download Fact Sheet for Healthcare Providers: https://pope.com/https://www.fda.gov/media/136313/download This test is not yet approved or cleared by the [place] FDA and has been authorized for detection and/or diagnosis of SARS-CoV-2 by FDA under an Emergency Use Authorization (EUA).  This EUA will remain in effect (meaning this test can be used) for the duration of the COVID-19 declaration under Section 564(b)(1) of the Act, 21 U.S.C. section 360bbb-3(b)(1), unless the authorization is terminated or revoked sooner. Performed at [facility], [address] [place], [place] [address]   Lactic acid, plasma     Status: None   Collection Time: 06/29/19  5:30 AM  Result Value Ref Range   Lactic Acid, Venous 0.8 0.5 - 1.9 mmol/L    Comment: Performed at [facility], [address] [place], [place] [address]  MRSA PCR Screening     Status: Abnormal   Collection Time: 06/29/19  5:30 AM   Specimen: Nasopharyngeal  Result Value Ref Range   MRSA by PCR POSITIVE (A) NEGATIVE    Comment:        The GeneXpert MRSA Assay (FDA approved for NASAL specimens only), is one component of a comprehensive  MRSA  colonization surveillance program. It is not intended to diagnose MRSA infection nor to guide or monitor treatment for MRSA infections. RESULT CALLED TO, READ BACK BY AND VERIFIED WITH: WEST,S @ 4098 ON [id number] BY POTEAT,S Performed at [facility], [address], [place], [place] [address]   Comprehensive metabolic panel     Status: Abnormal   Collection Time: 06/29/19  5:30 AM  Result Value Ref Range   Sodium 135 135 - 145 mmol/L   Potassium 3.4 (L) 3.5 - 5.1 mmol/L    Comment: REPEATED TO VERIFY DELTA CHECK NOTED    Chloride 105 98 - 111 mmol/L   CO2 21 (L) 22 - 32 mmol/L   Glucose, Bld 114 (H) 70 - 99 mg/dL   BUN 16 6 - 20 mg/dL   Creatinine, Ser 2.13 0.44 - 1.00 mg/dL   Calcium 8.6 (L) 8.9 - 10.3 mg/dL   Total Protein 6.9 6.5 - 8.1 g/dL   Albumin 3.5 3.5 - 5.0 g/dL   AST 14 (L) 15 - 41 U/L   ALT 16 0 - 44 U/L   Alkaline Phosphatase 74 38 - 126 U/L   Total Bilirubin 0.8 0.3 - 1.2 mg/dL   GFR calc non Af Amer >60 >60 mL/min   GFR calc Af Amer >60 >60 mL/min   Anion gap 9 5 - 15    Comment: Performed at [facility], [address] [place], [place] [address]  CBC WITH DIFFERENTIAL     Status: Abnormal   Collection Time: 06/29/19  5:30 AM  Result Value Ref Range   WBC 15.2 (H) 4.0 - 10.5 K/uL   RBC 3.41 (L) 3.87 - 5.11 MIL/uL   Hemoglobin 10.1 (L) 12.0 - 15.0 g/dL   HCT 84.6 (L) 96.2 - 95.2 %   MCV 95.6 80.0 - 100.0 fL   MCH 29.6 26.0 - 34.0 pg   MCHC 31.0 30.0 - 36.0 g/dL   RDW 84.1 32.4 - 40.1 %   Platelets 230 150 - 400 K/uL   nRBC 0.0 0.0 - 0.2 %   Neutrophils Relative % 82 %   Neutro Abs 12.5 (H) 1.7 - 7.7 K/uL   Lymphocytes Relative 9 %   Lymphs Abs 1.3 0.7 - 4.0 K/uL   Monocytes Relative 8 %   Monocytes Absolute 1.2 (H) 0.1 - 1.0 K/uL   Eosinophils Relative 0 %   Eosinophils Absolute 0.0 0.0 - 0.5 K/uL   Basophils Relative 0 %   Basophils Absolute 0.0 0.0 - 0.1 K/uL   Immature Granulocytes 1 %   Abs Immature  Granulocytes 0.14 (H) 0.00 - 0.07 K/uL    Comment: Performed at [facility], [address], [place], [place] [address]   *Note: Due to a large number of results and/or encounters for the requested time period, some results have not been displayed. A complete set of results can be found in Results Review.   Ct Soft Tissue Neck W Contrast  Result Date: 06/29/2019 CLINICAL DATA:  Initial evaluation for acute neck pain for 2 weeks, pain in left shoulder with redness and swelling to left jaw. EXAM: CT NECK WITH CONTRAST TECHNIQUE: Multidetector CT imaging of the neck was performed using the standard protocol following the bolus administration of intravenous contrast. CONTRAST:  75mL OMNIPAQUE IOHEXOL 300 MG/ML  SOLN COMPARISON:  None available. FINDINGS: Pharynx and larynx: Examination mildly limited by streak artifact from extensive spinal fusion hardware. Oral cavity within normal limits without  discrete mass or loculated collection. No acute abnormality about the dentition. No base of tongue lesion. Palatine tonsils not well visualized, and may be absent. Mild asymmetric fullness with mucosal edema within the left pharynx, favored to be secondary in nature to the acute inflammatory process within the left face/neck. Nasopharynx within normal limits. No visible retropharyngeal collection. Epiglottis within normal limits. Vallecula clear. Remainder of the hypopharynx and supraglottic larynx within normal limits. Glottis symmetric and normal. Subglottic airway clear. Salivary glands: Asymmetric enlargement and hyperenhancement of the left parotid gland, suggesting acute parotitis. Associated swelling with inflammatory stranding throughout the adjacent left parotid space, extending into the left masticator and submandibular spaces. Swelling with hazy inflammatory stranding extends inferiorly along the left neck, suggesting associated regional cellulitis. No obstructive stone seen within  Stensen's duct. No intraglandular abscess or other complication. Right parotid and submandibular glands are within normal limits. Mild hazy stranding about the left submandibular gland felt to be secondary in nature, with the gland itself felt to be within normal limits. Thyroid: Partially visualized thyroid grossly normal, although evaluation somewhat limited by streak artifact by adjacent spinal fusion hardware. Lymph nodes: Prominent and mildly enlarged left level II lymph nodes measure up to 11 mm, likely reactive. No other pathologically enlarged lymph nodes identified within the neck. Vascular: Normal intravascular enhancement seen throughout the neck. Limited intracranial: Unremarkable. Visualized orbits: Visualized globes and orbital soft tissues within normal limits. Mastoids and visualized paranasal sinuses: Visualized paranasal sinuses are clear. Visualized mastoids and middle ear cavities are well pneumatized and free of fluid. Skeleton: No acute osseous finding. No discrete lytic or blastic osseous lesions. Prior ACDF at C4 through T1. Extensive posterior fusion throughout the visualized cervicothoracic spine. Upper chest: Visualized upper chest demonstrates no acute finding. Partially visualized lungs are grossly clear. Other: None. IMPRESSION: 1. Asymmetric enlargement and hyperenhancement involving the left parotid gland, consistent with acute parotitis. Diffuse swelling with inflammatory stranding within the adjacent left face/neck compatible with associated regional cellulitis. No obstructive stone, abscess, or other complication identified. 2. Prominent and mildly enlarged left level II lymph nodes, likely reactive. Electronically Signed   By: Rise Mu M.D.   On: 06/29/2019 01:48    Pending Labs Unresulted Labs (From admission, onward)    Start     Ordered   07/01/19 0500  Creatinine, serum  Daily,   R     06/29/19 1616   06/30/19 0500  CBC with Differential/Platelet  Tomorrow  morning,   R     06/29/19 1542   06/30/19 0500  Basic metabolic panel  Tomorrow morning,   R     06/29/19 1542   06/28/19 2231  CBC with Differential  ONCE - STAT,   STAT     06/28/19 2235          Vitals/Pain Today's Vitals   06/29/19 1430 06/29/19 1500 06/29/19 1600 06/29/19 1630  BP: 121/81 138/73 110/80 135/74  Pulse: 95 (!) 101  94  Resp: 20   18  Temp:      TempSrc:      SpO2: 90% 94%  98%  Weight:      Height:      PainSc:        Isolation Precautions No active isolations  Medications Medications  Buprenorphine HCl FILM 900 mcg (900 mcg Buccal Not Given 06/29/19 1325)  oxyCODONE (Oxy IR/ROXICODONE) immediate release tablet 10 mg (10 mg Oral Given 06/29/19 0526)  hydroxychloroquine (PLAQUENIL) tablet 200 mg (200 mg Oral Given 06/29/19  1323)  busPIRone (BUSPAR) tablet 5 mg (5 mg Oral Given 06/29/19 1017)  FLUoxetine (PROZAC) capsule 80 mg (80 mg Oral Given 06/29/19 1016)  QUEtiapine (SEROQUEL) tablet 150 mg (has no administration in time range)  venlafaxine XR (EFFEXOR-XR) 24 hr capsule 37.5 mg (37.5 mg Oral Given 06/29/19 1323)  linaclotide (LINZESS) capsule 145 mcg (145 mcg Oral Given 06/29/19 1323)  naloxegol oxalate (MOVANTIK) tablet 25 mg (25 mg Oral Given 06/29/19 1323)  pantoprazole (PROTONIX) EC tablet 40 mg (40 mg Oral Given 06/29/19 1017)  oxybutynin (DITROPAN-XL) 24 hr tablet 5 mg (5 mg Oral Given 06/29/19 1323)  rivaroxaban (XARELTO) tablet 10 mg (10 mg Oral Given 06/29/19 1323)  Melatonin TABS 5 mg (has no administration in time range)  amantadine (SYMMETREL) capsule 100 mg (100 mg Oral Given 06/29/19 1018)  baclofen (LIORESAL) tablet 20 mg (20 mg Oral Given 06/29/19 1016)  clonazepam (KLONOPIN) disintegrating tablet 0.75 mg (0.75 mg Oral Given 06/29/19 1016)  lamoTRIgine (LAMICTAL) tablet 100 mg (has no administration in time range)  tiZANidine (ZANAFLEX) tablet 4 mg (4 mg Oral Given 06/29/19 1016)  topiramate (TOPAMAX) tablet 100 mg (has no administration in time  range)  guaiFENesin (MUCINEX) 12 hr tablet 600 mg (has no administration in time range)  sodium chloride flush (NS) 0.9 % injection 3 mL (3 mLs Intravenous Given 06/29/19 1325)  sodium chloride flush (NS) 0.9 % injection 3 mL (3 mLs Intravenous Given 06/29/19 1324)  sodium chloride flush (NS) 0.9 % injection 3 mL (has no administration in time range)  0.9 %  sodium chloride infusion (has no administration in time range)  acetaminophen (TYLENOL) tablet 650 mg (has no administration in time range)    Or  acetaminophen (TYLENOL) suppository 650 mg (has no administration in time range)  polyethylene glycol (MIRALAX / GLYCOLAX) packet 17 g (has no administration in time range)  ondansetron (ZOFRAN) tablet 4 mg (has no administration in time range)    Or  ondansetron (ZOFRAN) injection 4 mg (has no administration in time range)  bisacodyl (DULCOLAX) EC tablet 5 mg (has no administration in time range)  piperacillin-tazobactam (ZOSYN) IVPB 3.375 g (3.375 g Intravenous Not Given 06/29/19 1456)  protein supplement (ENSURE MAX) liquid (11 oz Oral Given 06/29/19 1324)  vancomycin (VANCOCIN) 500 mg in sodium chloride 0.9 % 100 mL IVPB (has no administration in time range)  Ampicillin-Sulbactam (UNASYN) 3 g in sodium chloride 0.9 % 100 mL IVPB (0 g Intravenous Stopped 06/29/19 0039)  sodium chloride 0.9 % bolus 1,000 mL (0 mLs Intravenous Stopped 06/29/19 0039)  iohexol (OMNIPAQUE) 300 MG/ML solution 75 mL (75 mLs Intravenous Contrast Given 06/29/19 0043)  sodium chloride (PF) 0.9 % injection (  Given by Other 06/29/19 1232)  vancomycin (VANCOCIN) IVPB 1000 mg/200 mL premix (0 mg Intravenous Stopped 06/29/19 0356)  piperacillin-tazobactam (ZOSYN) IVPB 3.375 g (0 g Intravenous Stopped 06/29/19 0437)    Mobility walks .

## 2019-06-29 NOTE — Progress Notes (Signed)
A consult was received from an ED physician for vancomycin per pharmacy dosing.  The patient's profile has been reviewed for ht/wt/allergies/indication/available labs.   A one time order has been placed for Vancomycin 1gm iv x1.  Further antibiotics/pharmacy consults should be ordered by admitting physician if indicated.                       Thank you, [name] 06/29/2019  2:19 AM

## 2019-06-29 NOTE — Progress Notes (Signed)
PROGRESS NOTE    [name] SlickerJudith [name]  HYQ:[id number]:[id number] DOB: Jul 31, 1976 DOA: 06/28/2019 PCP: [name] BosworthStallings, [name] [name], [name]   Brief Narrative:  Per admitting HP: [name] [name] is [name] 43 y.o. female with medical history significant for cerebral palsy, spastic quadriplegia, neurogenic bladder, decubitus ulcers, recurrent infections with MDR organisms, chronic pain, depression, anxiety, history of PE on Xarelto, and seizure disorder, now presenting to the emergency department for evaluation of neck pain.  Patient reports that she developed some pain in her neck couple weeks ago, described as severe progressively worsening.  She is not sure if she has had [name] fever or not.  She denies any cough or recent shortness of breath.  ED Course: Upon arrival to the ED, patient is found to be afebrile, saturating well on room air, and with systolic blood pressure of 93.  Chemistry panel is notable for bicarbonate of 17 and CBC features [name] leukocytosis to 16,000.  CT neck soft tissue reveals asymmetric enlargement and hyperenhancement of the left parotid consistent with acute parotitis.  Patient was treated with [name] liter of normal saline, vancomycin, Unasyn, and fentanyl in the ED.  Hospitalists are asked to admit.    Assessment & Plan:   Principal Problem:   Acute parotitis Active Problems:   Major depressive disorder, recurrent episode ([name])   CP (cerebral palsy), spastic ([name])   Recurrent pulmonary embolism ([name])   Anxiety state   Chronic pain syndrome   HYPERTENSION, BENIGN   Cerebral palsy, quadriplegic ([name])   Seizures ([name])   Parotiditis   1. Acute parotiditis:  - Presented with pain, swelling, and redness involving left neck and jaw, noted to have leukocytosis to 16k, and CT findings consistent with acute parotiditis with local cellulitis and myositis, but no stone, abscess, or airway compromise  - She has hx of MDR organisms and recent antibiotic use  - She was treated with vancomycin and Unasyn in ED  -  Placed on vanc and zosyn empiric broad-spectrum antibiotics, follow clinical course, cbc in AM  2. Cerebral palsy; spastic quadriplegia: - Continue amantadine and baclofen    3. Chronic pain: - Continue oxycodone, Zanaflex, buprenorphine   4. Depression; anxiety: - Continue Effexor, Buspar, Prozac, Seroquel, and Klonopin    5. History of PE: - Continue Xarelto    6. Connective tissue disorder:   - Continue Plaquenil    7. Seizure disorder  - Continue Topomax and Lamictal    DVT prophylaxis: XB:[id number]:Xarelto   Code Status: Full    Code Status Orders  (From admission, onward)         Start     Ordered   06/29/19 0338  Full code  Continuous     06/29/19 0337        Code Status History    Date Active Date Inactive Code Status Order ID Comments User Context   02/14/2019 2354 02/18/2019 1603 Full Code [id number]  [name] [name] ED   11/18/2018 1644 11/19/2018 2325 Full Code [id number]  [name] [name] ED   08/19/2018 1756 08/22/2018 1955 Full Code [id number]  [name] [name] [name]   05/25/2018 2059 05/29/2018 1956 Full Code [id number]  [name] [name] ED   11/24/2017 0313 11/27/2017 1910 Full Code [id number]  [name] [name] ED   10/09/2015 0216 10/30/2015 2103 Full Code [id number]  [name] [name] ED   06/09/2015 0249 06/14/2015 1958 Full Code [id number]  [name] [name] [name]   10/22/2014 1709 10/28/2014 1556 Full Code [id number]  [name] [name]   03/10/2014 2127 03/15/2014 1834 Full Code [id number]  Leeanne Rio, [name] [name]   03/07/2014 1521 03/08/2014 2000 Full Code [id number]  Newman Pies, [name] [name]   Advance Care Planning Activity     Family Communication: discussed plan of care with father, tom Disposition Plan:   Patient will be converted to [name], she will require IV antibiotics broad-spectrum given her history of multidrug-resistant infections in the setting of recurrent infections multidrug-resistant,  complicated by cerebral palsy and quadriplegia.  Without these treatments patient will be risk of life-threatening clinical deterioration Consults called: None Admission status: [name]   Consultants:   None  Procedures:  Ct Soft Tissue Neck W Contrast  Result Date: 06/29/2019 CLINICAL DATA:  Initial evaluation for acute neck pain for 2 weeks, pain in left shoulder with redness and swelling to left jaw. EXAM: CT NECK WITH CONTRAST TECHNIQUE: Multidetector CT imaging of the neck was performed using the standard protocol following the bolus administration of intravenous contrast. CONTRAST:  20mL OMNIPAQUE IOHEXOL 300 MG/ML  SOLN COMPARISON:  None available. FINDINGS: Pharynx and larynx: Examination mildly limited by streak artifact from extensive spinal fusion hardware. Oral cavity within normal limits without discrete mass or loculated collection. No acute abnormality about the dentition. No base of tongue lesion. Palatine tonsils not well visualized, and may be absent. Mild asymmetric fullness with mucosal edema within the left pharynx, favored to be secondary in nature to the acute inflammatory process within the left face/neck. Nasopharynx within normal limits. No visible retropharyngeal collection. Epiglottis within normal limits. Vallecula clear. Remainder of the hypopharynx and supraglottic larynx within normal limits. Glottis symmetric and normal. Subglottic airway clear. Salivary glands: Asymmetric enlargement and hyperenhancement of the left parotid gland, suggesting acute parotitis. Associated swelling with inflammatory stranding throughout the adjacent left parotid space, extending into the left masticator and submandibular spaces. Swelling with hazy inflammatory stranding extends inferiorly along the left neck, suggesting associated regional cellulitis. No obstructive stone seen within Stensen'[name] duct. No intraglandular abscess or other complication. Right parotid and submandibular glands are  within normal limits. Mild hazy stranding about the left submandibular gland felt to be secondary in nature, with the gland itself felt to be within normal limits. Thyroid: Partially visualized thyroid grossly normal, although evaluation somewhat limited by streak artifact by adjacent spinal fusion hardware. Lymph nodes: Prominent and mildly enlarged left level II lymph nodes measure up to 11 mm, likely reactive. No other pathologically enlarged lymph nodes identified within the neck. Vascular: Normal intravascular enhancement seen throughout the neck. Limited intracranial: Unremarkable. Visualized orbits: Visualized globes and orbital soft tissues within normal limits. Mastoids and visualized paranasal sinuses: Visualized paranasal sinuses are clear. Visualized mastoids and middle ear cavities are well pneumatized and free of fluid. Skeleton: No acute osseous finding. No discrete lytic or blastic osseous lesions. Prior ACDF at C4 through T1. Extensive posterior fusion throughout the visualized cervicothoracic spine. Upper chest: Visualized upper chest demonstrates no acute finding. Partially visualized lungs are grossly clear. Other: None. IMPRESSION: 1. Asymmetric enlargement and hyperenhancement involving the left parotid gland, consistent with acute parotitis. Diffuse swelling with inflammatory stranding within the adjacent left face/neck compatible with associated regional cellulitis. No obstructive stone, abscess, or other complication identified. 2. Prominent and mildly enlarged left level II lymph nodes, likely reactive. Electronically Signed   By: Jeannine Boga [name].D.   On: 06/29/2019 01:48     Antimicrobials:   Vancomycin and Zosyn day #2   Subjective: Patient remains in  the ER awaiting transfer up to [name] bed upstairs no acute events in the ER Request some water otherwise stable  Objective: Vitals:   06/29/19 1300 06/29/19 1330 06/29/19 1400 06/29/19 1430  BP: 135/72 120/74 118/72 121/81   Pulse: 82 87 91 95  Resp: (!) 27 14 (!) 22 20  Temp:      TempSrc:      SpO2: 92% 96% 96% 90%  Weight:      Height:        Intake/Output Summary (Last 24 hours) at 06/29/2019 1543 Last data filed at 06/29/2019 1249 Gross per 24 hour  Intake 50 ml  Output --  Net 50 ml   Filed Weights   06/28/19 2105  Weight: 49.9 kg    Examination: Constitutional: NAD, calm requested water Eyes: PERTLA, lids and conjunctivae normal ENMT: Mucous membranes are moist. Posterior pharynx clear of any exudate or lesions.   Neck: supple, no masses, no thyromegaly Respiratory: clear to auscultation bilaterally, no wheezing, no crackles. No accessory muscle use.  Cardiovascular: S1 & S2 heard, regular rate and rhythm. No significant JVD. Abdomen: No distension, no tenderness, soft. Bowel sounds normal.  Musculoskeletal: no clubbing / cyanosis. Flexion contractures.   Skin:   No changes, edema, tenderness, heat, and faint erythema involving angle of left mandible without fluctuance or drainage. Warm, dry, well-perfused. Neurologic: Dysarthria. Quadriplegia.  Psychiatric:  Alert and oriented to person, place, and situation. Calm, cooperative.    Data Reviewed: I have personally reviewed following labs and imaging studies  CBC: Recent Labs  Lab 06/29/19 0038 06/29/19 0530  WBC 16.0* 15.2*  NEUTROABS 12.7* 12.5*  HGB 10.3* 10.1*  HCT 32.6* 32.6*  MCV 97.0 95.6  PLT 186 230   Basic Metabolic Panel: Recent Labs  Lab 06/28/19 2349 06/29/19 0530  NA 137 135  K 4.6 3.4*  CL 107 105  CO2 17* 21*  GLUCOSE 119* 114*  BUN 18 16  CREATININE 0.51 0.49  CALCIUM 8.6* 8.6*   GFR: Estimated Creatinine Clearance: 70 mL/min (by C-G formula based on SCr of 0.49 mg/dL). Liver Function Tests: Recent Labs  Lab 06/29/19 0530  AST 14*  ALT 16  ALKPHOS 74  BILITOT 0.8  PROT 6.9  ALBUMIN 3.5   No results for input([name]): LIPASE, AMYLASE in the last 168 hours. No results for input([name]): AMMONIA in  the last 168 hours. Coagulation Profile: No results for input([name]): INR, PROTIME in the last 168 hours. Cardiac Enzymes: No results for input([name]): CKTOTAL, CKMB, CKMBINDEX, TROPONINI in the last 168 hours. BNP (last 3 results) No results for input([name]): PROBNP in the last 8760 hours. HbA1C: No results for input([name]): HGBA1C in the last 72 hours. CBG: No results for input([name]): GLUCAP in the last 168 hours. Lipid Profile: No results for input([name]): CHOL, HDL, LDLCALC, TRIG, CHOLHDL, LDLDIRECT in the last 72 hours. Thyroid Function Tests: No results for input([name]): TSH, T4TOTAL, FREET4, T3FREE, THYROIDAB in the last 72 hours. Anemia Panel: No results for input([name]): VITAMINB12, FOLATE, FERRITIN, TIBC, IRON, RETICCTPCT in the last 72 hours. Sepsis Labs: Recent Labs  Lab 06/29/19 0530  LATICACIDVEN 0.8    Recent Results (from the past 240 hour([name]))  SARS Coronavirus 2 (CEPHEID - [facility]), Hosp Order     Status: None   Collection Time: 06/29/19  2:12 AM   Specimen: Nasopharyngeal Swab  Result Value Ref Range Status   SARS Coronavirus 2 NEGATIVE NEGATIVE Final    Comment: (NOTE) If result is  NEGATIVE SARS-CoV-2 target nucleic acids are NOT DETECTED. The SARS-CoV-2 RNA is generally detectable in upper and lower  respiratory specimens during the acute phase of infection. The lowest  concentration of SARS-CoV-2 viral copies this assay can detect is 250  copies / mL. [name] negative result does not preclude SARS-CoV-2 infection  and should not be used as the sole basis for treatment or other  patient management decisions.  [name] negative result may occur with  improper specimen collection / handling, submission of specimen other  than nasopharyngeal swab, presence of viral mutation([name]) within the  areas targeted by this assay, and inadequate number of viral copies  (<250 copies / mL). [name] negative result must be combined with clinical  observations, patient history, and  epidemiological information. If result is POSITIVE SARS-CoV-2 target nucleic acids are DETECTED. The SARS-CoV-2 RNA is generally detectable in upper and lower  respiratory specimens dur ing the acute phase of infection.  Positive  results are indicative of active infection with SARS-CoV-2.  Clinical  correlation with patient history and other diagnostic information is  necessary to determine patient infection status.  Positive results [name]  not rule out bacterial infection or co-infection with other viruses. If result is PRESUMPTIVE POSTIVE SARS-CoV-2 nucleic acids MAY BE PRESENT.   [name] presumptive positive result was obtained on the submitted specimen  and confirmed on repeat testing.  While 2019 novel coronavirus  (SARS-CoV-2) nucleic acids may be present in the submitted sample  additional confirmatory testing may be necessary for epidemiological  and / or clinical management purposes  to differentiate between  SARS-CoV-2 and other Sarbecovirus currently known to infect humans.  If clinically indicated additional testing with an alternate test  methodology [phone]) is advised. The SARS-CoV-2 RNA is generally  detectable in upper and lower respiratory sp ecimens during the acute  phase of infection. The expected result is Negative. Fact Sheet for Patients:  BoilerBrush.com.cy Fact Sheet for Healthcare Providers: https://pope.com/ This test is not yet approved or cleared by the [place] FDA and has been authorized for detection and/or diagnosis of SARS-CoV-2 by FDA under an Emergency Use Authorization (EUA).  This EUA will remain in effect (meaning this test can be used) for the duration of the COVID-19 declaration under Section 564(b)(1) of the Act, 21 U.[name].C. section 360bbb-3(b)(1), unless the authorization is terminated or revoked sooner. Performed at [facility], [address] [place], [place] [address]     MRSA PCR Screening     Status: Abnormal   Collection Time: 06/29/19  5:30 AM   Specimen: Nasopharyngeal  Result Value Ref Range Status   MRSA by PCR POSITIVE ([name]) NEGATIVE Final    Comment:        The GeneXpert MRSA Assay (FDA approved for NASAL specimens only), is one component of [name] comprehensive MRSA colonization surveillance program. It is not intended to diagnose MRSA infection nor to guide or monitor treatment for MRSA infections. RESULT CALLED TO, READ BACK BY AND VERIFIED WITH: WEST,[name] @ [id number] ON [id number] BY POTEAT,[name] Performed at [facility], [address], [place], [place] [address]          Radiology Studies: Ct Soft Tissue Neck W Contrast  Result Date: 06/29/2019 CLINICAL DATA:  Initial evaluation for acute neck pain for 2 weeks, pain in left shoulder with redness and swelling to left jaw. EXAM: CT NECK WITH CONTRAST TECHNIQUE: Multidetector CT imaging of the neck was performed using the standard protocol following the bolus administration of  intravenous contrast. CONTRAST:  75mL OMNIPAQUE IOHEXOL 300 MG/ML  SOLN COMPARISON:  None available. FINDINGS: Pharynx and larynx: Examination mildly limited by streak artifact from extensive spinal fusion hardware. Oral cavity within normal limits without discrete mass or loculated collection. No acute abnormality about the dentition. No base of tongue lesion. Palatine tonsils not well visualized, and may be absent. Mild asymmetric fullness with mucosal edema within the left pharynx, favored to be secondary in nature to the acute inflammatory process within the left face/neck. Nasopharynx within normal limits. No visible retropharyngeal collection. Epiglottis within normal limits. Vallecula clear. Remainder of the hypopharynx and supraglottic larynx within normal limits. Glottis symmetric and normal. Subglottic airway clear. Salivary glands: Asymmetric enlargement and hyperenhancement of the left parotid gland, suggesting  acute parotitis. Associated swelling with inflammatory stranding throughout the adjacent left parotid space, extending into the left masticator and submandibular spaces. Swelling with hazy inflammatory stranding extends inferiorly along the left neck, suggesting associated regional cellulitis. No obstructive stone seen within Stensen'[name] duct. No intraglandular abscess or other complication. Right parotid and submandibular glands are within normal limits. Mild hazy stranding about the left submandibular gland felt to be secondary in nature, with the gland itself felt to be within normal limits. Thyroid: Partially visualized thyroid grossly normal, although evaluation somewhat limited by streak artifact by adjacent spinal fusion hardware. Lymph nodes: Prominent and mildly enlarged left level II lymph nodes measure up to 11 mm, likely reactive. No other pathologically enlarged lymph nodes identified within the neck. Vascular: Normal intravascular enhancement seen throughout the neck. Limited intracranial: Unremarkable. Visualized orbits: Visualized globes and orbital soft tissues within normal limits. Mastoids and visualized paranasal sinuses: Visualized paranasal sinuses are clear. Visualized mastoids and middle ear cavities are well pneumatized and free of fluid. Skeleton: No acute osseous finding. No discrete lytic or blastic osseous lesions. Prior ACDF at C4 through T1. Extensive posterior fusion throughout the visualized cervicothoracic spine. Upper chest: Visualized upper chest demonstrates no acute finding. Partially visualized lungs are grossly clear. Other: None. IMPRESSION: 1. Asymmetric enlargement and hyperenhancement involving the left parotid gland, consistent with acute parotitis. Diffuse swelling with inflammatory stranding within the adjacent left face/neck compatible with associated regional cellulitis. No obstructive stone, abscess, or other complication identified. 2. Prominent and mildly enlarged  left level II lymph nodes, likely reactive. Electronically Signed   By: Rise MuBenjamin  McClintock [name].D.   On: 06/29/2019 01:48        Scheduled Meds:  amantadine  100 mg Oral Daily   baclofen  20 mg Oral QID   Buprenorphine HCl  900 mcg Buccal Q12H   busPIRone  5 mg Oral BID   clonazepam  0.75 mg Oral TID   FLUoxetine  80 mg Oral Daily   hydroxychloroquine  200 mg Oral Daily   lamoTRIgine  100 mg Oral QHS   linaclotide  145 mcg Oral QAC breakfast   Melatonin  5 mg Oral QHS   naloxegol oxalate  25 mg Oral Daily   oxybutynin  5 mg Oral Daily   pantoprazole  40 mg Oral Daily   Ensure Max Protein  11 oz Oral BID   QUEtiapine  150 mg Oral QHS   rivaroxaban  10 mg Oral Daily   sodium chloride flush  3 mL Intravenous Q12H   sodium chloride flush  3 mL Intravenous Q12H   tiZANidine  4 mg Oral BID   topiramate  100 mg Oral QHS   venlafaxine XR  37.5 mg Oral Daily   Continuous  Infusions:  sodium chloride     piperacillin-tazobactam (ZOSYN)  IV Stopped (06/29/19 1249)   vancomycin       LOS: 0 days    Time spent: 35 min    Burke Keels, [name] [facility]  If 7PM-7AM, please contact night-coverage  06/29/2019, 3:43 PM

## 2019-06-29 NOTE — Telephone Encounter (Signed)
Copied from [place] [id number]. Topic: Quick Communication - See Telephone Encounter >> Jun 28, 2019  5:12 PM Loma [place] wrote: CRM for notification. See Telephone encounter for: 06/28/19. A person who would only give last name as Jimmye Norman called today wanting Dr Bridget Hartshorn to call her concerning pt. No DPR on file, she would not even repeat or give  any of her information or affiliation.  I wastrying to verify her and she became very irate which concerned me more, She tried putting pt on line for permission but could not understand pt. She says that pt is going to die if she does not eat and wants to talk to [place]. Pls contact pt home.

## 2019-06-29 NOTE — Progress Notes (Addendum)
Pharmacy Antibiotic Note  [name] [name] is a 43 y.o. female admitted on 06/28/2019 with acute parotitis with associated regional cellulitis. Pharmacy has been consulted for Vancomycin and Zosyn dosing.  Plan: Adjust maintenance dose of Vancomycin to 500mg  IV q12h.  Continue Zosyn 3.375g IV q8h (each dose infused over 4 hours).  Monitor renal function, cultures, clinical course.    Height: 5\' 2"  (157.5 cm) Weight: 110 lb (49.9 kg) IBW/kg (Calculated) : 50.1  Temp (24hrs), Avg:98.2 F (36.8 C), Min:98.2 F (36.8 C), Max:98.2 F (36.8 C)  Recent Labs  Lab 06/28/19 2349 06/29/19 0038 06/29/19 0530  WBC  --  16.0* 15.2*  CREATININE 0.51  --  0.49  LATICACIDVEN  --   --  0.8    Estimated Creatinine Clearance: 70 mL/min (by C-G formula based on SCr of 0.49 mg/dL).    Allergies  Allergen Reactions  . Morphine Dermatitis and Other (See Comments)    Skin turned red     Thank you for allowing pharmacy to be a part of this patient's care.    Lindell Spar, PharmD, BCPS Clinical Pharmacist 06/29/2019 4:11 PM

## 2019-06-29 NOTE — Progress Notes (Signed)
Pharmacy Antibiotic Note  [name] [name] is a 43 y.o. female admitted on 06/28/2019 with Parotitis w/ history of MRSA.  Pharmacy has been consulted for Vancomycin and Zosyn dosing.  Plan: Vancomycin 1gm iv x1, then Vancomycin 1000 mg IV Q 24 hrs. Goal AUC 400-550. Expected AUC: 443 SCr used: 0.8 (adjusted)  Zosyn 3.375gm iv x1, then Zosyn 3.375g IV Q8H infused over 4hrs.    Height: 5\' 2"  (157.5 cm) Weight: 110 lb (49.9 kg) IBW/kg (Calculated) : 50.1  Temp (24hrs), Avg:98.2 F (36.8 C), Min:98.2 F (36.8 C), Max:98.2 F (36.8 C)  Recent Labs  Lab 06/28/19 2349 06/29/19 0038 06/29/19 0530  WBC  --  16.0* 15.2*  CREATININE 0.51  --   --   LATICACIDVEN  --   --  0.8    Estimated Creatinine Clearance: 70 mL/min (by C-G formula based on SCr of 0.51 mg/dL).    Allergies  Allergen Reactions  . Morphine Dermatitis and Other (See Comments)    Skin turned red     Antimicrobials this admission: Vancomycin 06/29/2019 >> Zosyn 06/29/2019 >>   Dose adjustments this admission: -  Microbiology results: -  Thank you for allowing pharmacy to be a part of this patient's care.  [name] [name] 06/29/2019 6:20 AM

## 2019-06-29 NOTE — ED Notes (Signed)
Rpt called to [facility] RN

## 2019-06-29 NOTE — ED Notes (Signed)
Pt back from radiology 

## 2019-06-30 ENCOUNTER — Encounter (HOSPITAL_BASED_OUTPATIENT_CLINIC_OR_DEPARTMENT_OTHER): Payer: Medicare Other

## 2019-06-30 LAB — CBC WITH DIFFERENTIAL/PLATELET
Abs Immature Granulocytes: 0.12 10*3/uL — ABNORMAL HIGH (ref 0.00–0.07)
Basophils Absolute: 0 10*3/uL (ref 0.0–0.1)
Basophils Relative: 0 %
Eosinophils Absolute: 0.1 10*3/uL (ref 0.0–0.5)
Eosinophils Relative: 1 %
HCT: 32.6 % — ABNORMAL LOW (ref 36.0–46.0)
Hemoglobin: 10.2 g/dL — ABNORMAL LOW (ref 12.0–15.0)
Immature Granulocytes: 1 %
Lymphocytes Relative: 16 %
Lymphs Abs: 1.8 10*3/uL (ref 0.7–4.0)
MCH: 29.9 pg (ref 26.0–34.0)
MCHC: 31.3 g/dL (ref 30.0–36.0)
MCV: 95.6 fL (ref 80.0–100.0)
Monocytes Absolute: 1.1 10*3/uL — ABNORMAL HIGH (ref 0.1–1.0)
Monocytes Relative: 10 %
Neutro Abs: 8.5 10*3/uL — ABNORMAL HIGH (ref 1.7–7.7)
Neutrophils Relative %: 72 %
Platelets: 254 10*3/uL (ref 150–400)
RBC: 3.41 MIL/uL — ABNORMAL LOW (ref 3.87–5.11)
RDW: 13.7 % (ref 11.5–15.5)
WBC: 11.7 10*3/uL — ABNORMAL HIGH (ref 4.0–10.5)
nRBC: 0 % (ref 0.0–0.2)

## 2019-06-30 LAB — BASIC METABOLIC PANEL
Anion gap: 10 (ref 5–15)
BUN: 10 mg/dL (ref 6–20)
CO2: 22 mmol/L (ref 22–32)
Calcium: 8.4 mg/dL — ABNORMAL LOW (ref 8.9–10.3)
Chloride: 108 mmol/L (ref 98–111)
Creatinine, Ser: 0.47 mg/dL (ref 0.44–1.00)
GFR calc Af Amer: >60 mL/min (ref >60)
GFR calc non Af Amer: >60 mL/min (ref >60)
Glucose, Bld: 81 mg/dL (ref 70–99)
Potassium: 3.1 mmol/L — ABNORMAL LOW (ref 3.5–5.1)
Sodium: 140 mmol/L (ref 135–145)

## 2019-06-30 MED ORDER — FENTANYL CITRATE (PF) 100 MCG/2ML IJ SOLN
12.5000 ug | INTRAMUSCULAR | Status: DC | PRN
  Administered 2019-06-30 – 2019-07-02 (×9): 12.5 ug via INTRAVENOUS
  Filled 2019-06-30 (×9): qty 2

## 2019-06-30 MED ORDER — CLONAZEPAM 0.5 MG PO TBDP
0.7500 mg | ORAL_TABLET | Freq: Three times a day (TID) | ORAL | Status: DC
  Filled 2019-06-30: qty 1

## 2019-06-30 MED ORDER — POTASSIUM CHLORIDE CRYS ER 20 MEQ PO TBCR
40.0000 meq | EXTENDED_RELEASE_TABLET | Freq: Two times a day (BID) | ORAL | Status: AC
  Administered 2019-06-30 (×2): 40 meq via ORAL
  Filled 2019-06-30 (×2): qty 2

## 2019-06-30 NOTE — Telephone Encounter (Signed)
Attempted to call pt as well. The mailbox was full and cannot accept any messages

## 2019-06-30 NOTE — Progress Notes (Signed)
Spoke with Tarshia(patients caregiver)in regard to patients home medication, Buprenorphine.  Caregiver not comfortable bringing medication to hospital.

## 2019-06-30 NOTE — Telephone Encounter (Signed)
Attempted to reach patient at the number in the chart. No answer and the mail box was full.

## 2019-06-30 NOTE — Progress Notes (Addendum)
PROGRESS NOTE    [name] [name]  WUJ:[id number] DOB: 1976/06/07 DOA: 06/28/2019 PCP: Forrest Moron, MD     Brief Narrative:  [name] [name] a 43 y.o.femalewith medical history significant forcerebral palsy, spastic quadriplegia, neurogenic bladder, decubitus ulcers, recurrent infections with MDR organisms, chronic pain, depression, anxiety, history of PE on Xarelto, and seizure disorder, now presenting to the emergency department for evaluation of neck pain.Patient reports that she developed some pain in her neck couple weeks ago, described as severe progressively worsening. She is not sure if she has had a fever or not. She denies any cough or recent shortness of breath. CT neck soft tissue reveals asymmetric enlargement and hyperenhancement of the left parotid consistent with acute parotitis.Patient was treated with a liter of normal saline, vancomycin, Unasyn, and fentanyl in the ED. Hospitalists are asked to admit.   New events last 24 hours / Subjective: Complaining of neck pain.  No issues with swallowing medications  Assessment & Plan:   Principal Problem:   Acute parotitis Active Problems:   Major depressive disorder, recurrent episode ([name])   CP (cerebral palsy), spastic ([name])   Recurrent pulmonary embolism ([name])   Anxiety state   Chronic pain syndrome   HYPERTENSION, BENIGN   Cerebral palsy, quadriplegic ([name])   Seizures ([name])   Parotiditis   Acute parotitis -Presented with pain, swelling, and redness involving left neck and jaw, noted to have leukocytosis to 16k, and CT findings consistent with acute parotitis with local cellulitis and myositis, but no stone, abscess, or airway compromise -She has hx of MDR organisms and recent antibiotic use -Vanc and zosyn empiric broad-spectrum antibiotics, follow clinical course -Leukocytosis improving 11.7 this morning  Cerebral palsy, spastic quadriplegia -Continue amantadine and baclofen  Chronic  pain -Continue oxycodone, Zanaflex, buprenorphine.  Discussed with pharmacy regarding buprenorphine which we don't have on formulary. Patient may have family/caregiver drop off medication  Depression, anxiety -Continue Effexor, Buspar, Prozac, Seroquel, and Klonopin  History of PE -Continue Xarelto  Connective tissue disorder -Continue Plaquenil  Seizure disorder -Continue Topomax and Lamictal   Hypokalemia -Replace, trend      DVT prophylaxis: Xarelto Code Status: Full code Family Communication: None Disposition Plan: Pending clinical improvement   Consultants:   None  Procedures:   None  Antimicrobials:  Anti-infectives (From admission, onward)   Start     Dose/Rate Route Frequency Ordered Stop   06/29/19 2200  vancomycin (VANCOCIN) IVPB 1000 mg/200 mL premix  Status:  Discontinued     1,000 mg 200 mL/hr over 60 Minutes Intravenous Every 24 hours 06/29/19 0620 06/29/19 1610   06/29/19 1800  vancomycin (VANCOCIN) 500 mg in sodium chloride 0.9 % 100 mL IVPB     500 mg 100 mL/hr over 60 Minutes Intravenous Every 12 hours 06/29/19 1610     06/29/19 1000  hydroxychloroquine (PLAQUENIL) tablet 200 mg     200 mg Oral Daily 06/29/19 0337     06/29/19 0800  piperacillin-tazobactam (ZOSYN) IVPB 3.375 g     3.375 g 12.5 mL/hr over 240 Minutes Intravenous Every 8 hours 06/29/19 0244     06/29/19 0245  piperacillin-tazobactam (ZOSYN) IVPB 3.375 g     3.375 g 100 mL/hr over 30 Minutes Intravenous  Once 06/29/19 0243 06/29/19 0437   06/29/19 0230  vancomycin (VANCOCIN) IVPB 1000 mg/200 mL premix     1,000 mg 200 mL/hr over 60 Minutes Intravenous  Once 06/29/19 0218 06/29/19 0356   06/29/19 0215  Ampicillin-Sulbactam (UNASYN) 3 g in sodium chloride  0.9 % 100 mL IVPB  Status:  Discontinued     3 g 200 mL/hr over 30 Minutes Intravenous  Once 06/29/19 0208 06/29/19 0210   06/28/19 2245  Ampicillin-Sulbactam (UNASYN) 3 g in sodium chloride 0.9 % 100 mL IVPB     3  g 200 mL/hr over 30 Minutes Intravenous  Once 06/28/19 2235 06/29/19 0039       Objective: Vitals:   06/29/19 1630 06/29/19 1717 06/29/19 2023 06/30/19 0700  BP: 135/74 136/68 122/61 121/80  Pulse: 94 88 97 91  Resp: 18 17 (!) 22 20  Temp:  99.9 F (37.7 C) 99.2 F (37.3 C) 98.9 F (37.2 C)  TempSrc:  Oral Oral Oral  SpO2: 98% 100% 100% 97%  Weight:  49 kg    Height:  5\' 2"  (1.575 m)      Intake/Output Summary (Last 24 hours) at 06/30/2019 1340 Last data filed at 06/30/2019 0600 Gross per 24 hour  Intake 512.99 ml  Output 1600 ml  Net -1087.01 ml   Filed Weights   06/28/19 2105 06/29/19 1717  Weight: 49.9 kg 49 kg    Examination:  General exam: Appears calm  Respiratory system: Clear to auscultation. Respiratory effort normal. Cardiovascular system: S1 & S2 heard, RRR. No JVD, murmurs, rubs, gallops or clicks. No pedal edema. Gastrointestinal system: Abdomen is nondistended, soft and nontender.  Central nervous system: Alert  Extremities: Contracture of the extremities consistent with cerebral palsy Skin: + Left cheek erythema and swelling   Data Reviewed: I have personally reviewed following labs and imaging studies  CBC: Recent Labs  Lab 06/29/19 0038 06/29/19 0530 06/30/19 0617  WBC 16.0* 15.2* 11.7*  NEUTROABS 12.7* 12.5* 8.5*  HGB 10.3* 10.1* 10.2*  HCT 32.6* 32.6* 32.6*  MCV 97.0 95.6 95.6  PLT 186 230 254   Basic Metabolic Panel: Recent Labs  Lab 06/28/19 2349 06/29/19 0530 06/30/19 0617  NA 137 135 140  K 4.6 3.4* 3.1*  CL 107 105 108  CO2 17* 21* 22  GLUCOSE 119* 114* 81  BUN 18 16 10   CREATININE 0.51 0.49 0.47  CALCIUM 8.6* 8.6* 8.4*   GFR: Estimated Creatinine Clearance: 68.7 mL/min (by C-G formula based on SCr of 0.47 mg/dL). Liver Function Tests: Recent Labs  Lab 06/29/19 0530  AST 14*  ALT 16  ALKPHOS 74  BILITOT 0.8  PROT 6.9  ALBUMIN 3.5   No results for input(s): LIPASE, AMYLASE in the last 168 hours. No results  for input(s): AMMONIA in the last 168 hours. Coagulation Profile: No results for input(s): INR, PROTIME in the last 168 hours. Cardiac Enzymes: No results for input(s): CKTOTAL, CKMB, CKMBINDEX, TROPONINI in the last 168 hours. BNP (last 3 results) No results for input(s): PROBNP in the last 8760 hours. HbA1C: No results for input(s): HGBA1C in the last 72 hours. CBG: No results for input(s): GLUCAP in the last 168 hours. Lipid Profile: No results for input(s): CHOL, HDL, LDLCALC, TRIG, CHOLHDL, LDLDIRECT in the last 72 hours. Thyroid Function Tests: No results for input(s): TSH, T4TOTAL, FREET4, T3FREE, THYROIDAB in the last 72 hours. Anemia Panel: No results for input(s): VITAMINB12, FOLATE, FERRITIN, TIBC, IRON, RETICCTPCT in the last 72 hours. Sepsis Labs: Recent Labs  Lab 06/29/19 0530  LATICACIDVEN 0.8    Recent Results (from the past 240 hour(s))  SARS Coronavirus 2 (CEPHEID - Performed in [facility] hospital lab), Hosp Order     Status: None   Collection Time: 06/29/19  2:12 AM  Specimen: Nasopharyngeal Swab  Result Value Ref Range Status   SARS Coronavirus 2 NEGATIVE NEGATIVE Final    Comment: (NOTE) If result is NEGATIVE SARS-CoV-2 target nucleic acids are NOT DETECTED. The SARS-CoV-2 RNA is generally detectable in upper and lower  respiratory specimens during the acute phase of infection. The lowest  concentration of SARS-CoV-2 viral copies this assay can detect is 250  copies / mL. A negative result does not preclude SARS-CoV-2 infection  and should not be used as the sole basis for treatment or other  patient management decisions.  A negative result may occur with  improper specimen collection / handling, submission of specimen other  than nasopharyngeal swab, presence of viral mutation(s) within the  areas targeted by this assay, and inadequate number of viral copies  (<250 copies / mL). A negative result must be combined with clinical  observations, patient  history, and epidemiological information. If result is POSITIVE SARS-CoV-2 target nucleic acids are DETECTED. The SARS-CoV-2 RNA is generally detectable in upper and lower  respiratory specimens dur ing the acute phase of infection.  Positive  results are indicative of active infection with SARS-CoV-2.  Clinical  correlation with patient history and other diagnostic information is  necessary to determine patient infection status.  Positive results do  not rule out bacterial infection or co-infection with other viruses. If result is PRESUMPTIVE POSTIVE SARS-CoV-2 nucleic acids MAY BE PRESENT.   A presumptive positive result was obtained on the submitted specimen  and confirmed on repeat testing.  While 2019 novel coronavirus  (SARS-CoV-2) nucleic acids may be present in the submitted sample  additional confirmatory testing may be necessary for epidemiological  and / or clinical management purposes  to differentiate between  SARS-CoV-2 and other Sarbecovirus currently known to infect humans.  If clinically indicated additional testing with an alternate test  methodology [phone]) is advised. The SARS-CoV-2 RNA is generally  detectable in upper and lower respiratory sp ecimens during the acute  phase of infection. The expected result is Negative. Fact Sheet for Patients:  BoilerBrush.com.cyhttps://www.fda.gov/media/136312/download Fact Sheet for Healthcare Providers: https://pope.com/https://www.fda.gov/media/136313/download This test is not yet approved or cleared by the [place] FDA and has been authorized for detection and/or diagnosis of SARS-CoV-2 by FDA under an Emergency Use Authorization (EUA).  This EUA will remain in effect (meaning this test can be used) for the duration of the COVID-19 declaration under Section 564(b)(1) of the Act, 21 U.S.C. section 360bbb-3(b)(1), unless the authorization is terminated or revoked sooner. Performed at [facility], [address] [place],  [place] [address]   MRSA PCR Screening     Status: Abnormal   Collection Time: 06/29/19  5:30 AM   Specimen: Nasopharyngeal  Result Value Ref Range Status   MRSA by PCR POSITIVE (A) NEGATIVE Final    Comment:        The GeneXpert MRSA Assay (FDA approved for NASAL specimens only), is one component of a comprehensive MRSA colonization surveillance program. It is not intended to diagnose MRSA infection nor to guide or monitor treatment for MRSA infections. RESULT CALLED TO, READ BACK BY AND VERIFIED WITH: WEST,S @ [id number] ON [id number] BY POTEAT,S Performed at [facility], [address] [place], [place] [address]       Radiology Studies: Ct Soft Tissue Neck W Contrast  Result Date: 06/29/2019 CLINICAL DATA:  Initial evaluation for acute neck pain for 2 weeks, pain in left shoulder with redness and swelling to left jaw. EXAM: CT  NECK WITH CONTRAST TECHNIQUE: Multidetector CT imaging of the neck was performed using the standard protocol following the bolus administration of intravenous contrast. CONTRAST:  75mL OMNIPAQUE IOHEXOL 300 MG/ML  SOLN COMPARISON:  None available. FINDINGS: Pharynx and larynx: Examination mildly limited by streak artifact from extensive spinal fusion hardware. Oral cavity within normal limits without discrete mass or loculated collection. No acute abnormality about the dentition. No base of tongue lesion. Palatine tonsils not well visualized, and may be absent. Mild asymmetric fullness with mucosal edema within the left pharynx, favored to be secondary in nature to the acute inflammatory process within the left face/neck. Nasopharynx within normal limits. No visible retropharyngeal collection. Epiglottis within normal limits. Vallecula clear. Remainder of the hypopharynx and supraglottic larynx within normal limits. Glottis symmetric and normal. Subglottic airway clear. Salivary glands: Asymmetric enlargement and hyperenhancement of the left parotid gland,  suggesting acute parotitis. Associated swelling with inflammatory stranding throughout the adjacent left parotid space, extending into the left masticator and submandibular spaces. Swelling with hazy inflammatory stranding extends inferiorly along the left neck, suggesting associated regional cellulitis. No obstructive stone seen within Stensen's duct. No intraglandular abscess or other complication. Right parotid and submandibular glands are within normal limits. Mild hazy stranding about the left submandibular gland felt to be secondary in nature, with the gland itself felt to be within normal limits. Thyroid: Partially visualized thyroid grossly normal, although evaluation somewhat limited by streak artifact by adjacent spinal fusion hardware. Lymph nodes: Prominent and mildly enlarged left level II lymph nodes measure up to 11 mm, likely reactive. No other pathologically enlarged lymph nodes identified within the neck. Vascular: Normal intravascular enhancement seen throughout the neck. Limited intracranial: Unremarkable. Visualized orbits: Visualized globes and orbital soft tissues within normal limits. Mastoids and visualized paranasal sinuses: Visualized paranasal sinuses are clear. Visualized mastoids and middle ear cavities are well pneumatized and free of fluid. Skeleton: No acute osseous finding. No discrete lytic or blastic osseous lesions. Prior ACDF at C4 through T1. Extensive posterior fusion throughout the visualized cervicothoracic spine. Upper chest: Visualized upper chest demonstrates no acute finding. Partially visualized lungs are grossly clear. Other: None. IMPRESSION: 1. Asymmetric enlargement and hyperenhancement involving the left parotid gland, consistent with acute parotitis. Diffuse swelling with inflammatory stranding within the adjacent left face/neck compatible with associated regional cellulitis. No obstructive stone, abscess, or other complication identified. 2. Prominent and mildly  enlarged left level II lymph nodes, likely reactive. Electronically Signed   By: Rise MuBenjamin  McClintock M.D.   On: 06/29/2019 01:48      Scheduled Meds:  amantadine  100 mg Oral Daily   baclofen  20 mg Oral QID   Buprenorphine HCl  900 mcg Buccal Q12H   busPIRone  5 mg Oral BID   clonazepam  0.75 mg Oral TID   FLUoxetine  80 mg Oral Daily   hydroxychloroquine  200 mg Oral Daily   lamoTRIgine  100 mg Oral QHS   linaclotide  145 mcg Oral QAC breakfast   Melatonin  5 mg Oral QHS   naloxegol oxalate  25 mg Oral Daily   oxybutynin  5 mg Oral Daily   pantoprazole  40 mg Oral Daily   potassium chloride  40 mEq Oral BID   Ensure Max Protein  11 oz Oral BID   QUEtiapine  150 mg Oral QHS   rivaroxaban  10 mg Oral Daily   sodium chloride flush  3 mL Intravenous Q12H   sodium chloride flush  3 mL Intravenous Q12H  tiZANidine  4 mg Oral BID   topiramate  100 mg Oral QHS   venlafaxine XR  37.5 mg Oral Daily   Continuous Infusions:  sodium chloride 250 mL (06/29/19 1756)   piperacillin-tazobactam (ZOSYN)  IV 3.375 g (06/30/19 [id number])   vancomycin 500 mg (06/30/19 0515)     LOS: 1 day      Time spent: 35 minutes   Noralee StainJennifer Tahmid Stonehocker, DO [facility] [email or web address] 06/30/2019, 1:40 PM

## 2019-07-01 LAB — BASIC METABOLIC PANEL
Anion gap: 8 (ref 5–15)
BUN: 7 mg/dL (ref 6–20)
CO2: 21 mmol/L — ABNORMAL LOW (ref 22–32)
Calcium: 8.3 mg/dL — ABNORMAL LOW (ref 8.9–10.3)
Chloride: 109 mmol/L (ref 98–111)
Creatinine, Ser: 0.42 mg/dL — ABNORMAL LOW (ref 0.44–1.00)
GFR calc Af Amer: >60 mL/min (ref >60)
GFR calc non Af Amer: >60 mL/min (ref >60)
Glucose, Bld: 93 mg/dL (ref 70–99)
Potassium: 4.2 mmol/L (ref 3.5–5.1)
Sodium: 138 mmol/L (ref 135–145)

## 2019-07-01 LAB — CBC
HCT: 30.4 % — ABNORMAL LOW (ref 36.0–46.0)
Hemoglobin: 9.9 g/dL — ABNORMAL LOW (ref 12.0–15.0)
MCH: 29.9 pg (ref 26.0–34.0)
MCHC: 32.6 g/dL (ref 30.0–36.0)
MCV: 91.8 fL (ref 80.0–100.0)
Platelets: 253 10*3/uL (ref 150–400)
RBC: 3.31 MIL/uL — ABNORMAL LOW (ref 3.87–5.11)
RDW: 13.7 % (ref 11.5–15.5)
WBC: 11.5 10*3/uL — ABNORMAL HIGH (ref 4.0–10.5)
nRBC: 0 % (ref 0.0–0.2)

## 2019-07-01 LAB — MAGNESIUM: Magnesium: 1.7 mg/dL (ref 1.7–2.4)

## 2019-07-01 MED ORDER — CLONAZEPAM 0.5 MG PO TBDP
0.7500 mg | ORAL_TABLET | Freq: Three times a day (TID) | ORAL | Status: DC
  Administered 2019-07-01 – 2019-07-02 (×4): 0.75 mg via ORAL
  Filled 2019-07-01 (×4): qty 1

## 2019-07-01 NOTE — Progress Notes (Signed)
PROGRESS NOTE    [name] SlickerJudith [name]  ZOX:[id number] DOB: [birth year] DOA: 06/28/2019 PCP: Doristine BosworthStallings, Zoe A, MD     Brief Narrative:  [name] MuckleJudith Bradhamis a 43 y.o.femalewith medical history significant forcerebral palsy, spastic quadriplegia, neurogenic bladder, decubitus ulcers, recurrent infections with MDR organisms, chronic pain, depression, anxiety, history of PE on Xarelto, and seizure disorder, now presenting to the emergency department for evaluation of neck pain.Patient reports that she developed some pain in her neck couple weeks ago, described as severe progressively worsening. She is not sure if she has had a fever or not. She denies any cough or recent shortness of breath. CT neck soft tissue reveals asymmetric enlargement and hyperenhancement of the left parotid consistent with acute parotitis.Patient was treated with a liter of normal saline, vancomycin, Unasyn, and fentanyl in the ED. Hospitalists are asked to admit.   New events last 24 hours / Subjective: Complains of a headache  Assessment & Plan:   Principal Problem:   Acute parotitis Active Problems:   Major depressive disorder, recurrent episode ([name])   CP (cerebral palsy), spastic ([name])   Recurrent pulmonary embolism ([name])   Anxiety state   Chronic pain syndrome   HYPERTENSION, BENIGN   Cerebral palsy, quadriplegic ([name])   Seizures ([name])   Parotiditis   Acute parotitis -Presented with pain, swelling, and redness involving left neck and jaw, noted to have leukocytosis to 16k, and CT findings consistent with acute parotitis with local cellulitis and myositis, but no stone, abscess, or airway compromise -She has hx of MDR organisms and recent antibiotic use -Vanc and zosyn empiric broad-spectrum antibiotics, follow clinical course -Leukocytosis improving 11.5 this morning -If continues to improve, plan to transition to Augmentin for discharge home  Cerebral palsy, spastic quadriplegia -Continue amantadine  and baclofen  Chronic pain -Continue oxycodone, Zanaflex, buprenorphine.  Discussed with pharmacy regarding buprenorphine which we don't have on formulary.  Informed patient's caregiver that she may drop off the medication at the hospital front desk  Depression, anxiety -Continue Effexor, Buspar, Prozac, Seroquel, and Klonopin  History of PE -Continue Xarelto  Connective tissue disorder -Continue Plaquenil  Seizure disorder -Continue Topomax and Lamictal        DVT prophylaxis: Xarelto Code Status: Full code Family Communication: None Disposition Plan: Pending clinical improvement   Consultants:   None  Procedures:   None  Antimicrobials:  Anti-infectives (From admission, onward)   Start     Dose/Rate Route Frequency Ordered Stop   06/29/19 2200  vancomycin (VANCOCIN) IVPB 1000 mg/200 mL premix  Status:  Discontinued     1,000 mg 200 mL/hr over 60 Minutes Intravenous Every 24 hours 06/29/19 0620 06/29/19 1610   06/29/19 1800  vancomycin (VANCOCIN) 500 mg in sodium chloride 0.9 % 100 mL IVPB     500 mg 100 mL/hr over 60 Minutes Intravenous Every 12 hours 06/29/19 1610     06/29/19 1000  hydroxychloroquine (PLAQUENIL) tablet 200 mg     200 mg Oral Daily 06/29/19 0337     06/29/19 0800  piperacillin-tazobactam (ZOSYN) IVPB 3.375 g     3.375 g 12.5 mL/hr over 240 Minutes Intravenous Every 8 hours 06/29/19 0244     06/29/19 0245  piperacillin-tazobactam (ZOSYN) IVPB 3.375 g     3.375 g 100 mL/hr over 30 Minutes Intravenous  Once 06/29/19 0243 06/29/19 0437   06/29/19 0230  vancomycin (VANCOCIN) IVPB 1000 mg/200 mL premix     1,000 mg 200 mL/hr over 60 Minutes Intravenous  Once 06/29/19 0218 06/29/19 0356  06/29/19 0215  Ampicillin-Sulbactam (UNASYN) 3 g in sodium chloride 0.9 % 100 mL IVPB  Status:  Discontinued     3 g 200 mL/hr over 30 Minutes Intravenous  Once 06/29/19 0208 06/29/19 0210   06/28/19 2245  Ampicillin-Sulbactam (UNASYN) 3 g in  sodium chloride 0.9 % 100 mL IVPB     3 g 200 mL/hr over 30 Minutes Intravenous  Once 06/28/19 2235 06/29/19 0039       Objective: Vitals:   06/30/19 0700 06/30/19 1425 06/30/19 2234 07/01/19 0523  BP: 121/80 121/88 (!) 145/74 118/83  Pulse: 91 87 82 88  Resp: 20 16 18 16   Temp: 98.9 F (37.2 C) 98.3 F (36.8 C) 98.8 F (37.1 C) 98.4 F (36.9 C)  TempSrc: Oral Oral Axillary Axillary  SpO2: 97% 96% 95% 100%  Weight:      Height:        Intake/Output Summary (Last 24 hours) at 07/01/2019 1159 Last data filed at 07/01/2019 0600 Gross per 24 hour  Intake 607.26 ml  Output 1700 ml  Net -1092.74 ml   Filed Weights   06/28/19 2105 06/29/19 1717  Weight: 49.9 kg 49 kg    Examination: General exam: Appears calm Respiratory system: Clear to auscultation. Respiratory effort normal. Cardiovascular system: S1 & S2 heard, RRR. No JVD, murmurs, rubs, gallops or clicks. No pedal edema. Gastrointestinal system: Abdomen is nondistended, soft and nontender. No organomegaly or masses felt. Normal bowel sounds heard. Central nervous system: Alert  Extremities: Contractures of extremities due to CP Skin: + Left cheek erythema and edema has decreased in size   Data Reviewed: I have personally reviewed following labs and imaging studies  CBC: Recent Labs  Lab 06/29/19 0038 06/29/19 0530 06/30/19 0617 07/01/19 0558  WBC 16.0* 15.2* 11.7* 11.5*  NEUTROABS 12.7* 12.5* 8.5*  --   HGB 10.3* 10.1* 10.2* 9.9*  HCT 32.6* 32.6* 32.6* 30.4*  MCV 97.0 95.6 95.6 91.8  PLT 186 230 254 737   Basic Metabolic Panel: Recent Labs  Lab 06/28/19 2349 06/29/19 0530 06/30/19 0617 07/01/19 0558  NA 137 135 140 138  K 4.6 3.4* 3.1* 4.2  CL 107 105 108 109  CO2 17* 21* 22 21*  GLUCOSE 119* 114* 81 93  BUN 18 16 10 7   CREATININE 0.51 0.49 0.47 0.42*  CALCIUM 8.6* 8.6* 8.4* 8.3*  MG  --   --   --  1.7   GFR: Estimated Creatinine Clearance: 68.7 mL/min (A) (by C-G formula based on SCr of  0.42 mg/dL (L)). Liver Function Tests: Recent Labs  Lab 06/29/19 0530  AST 14*  ALT 16  ALKPHOS 74  BILITOT 0.8  PROT 6.9  ALBUMIN 3.5   No results for input(s): LIPASE, AMYLASE in the last 168 hours. No results for input(s): AMMONIA in the last 168 hours. Coagulation Profile: No results for input(s): INR, PROTIME in the last 168 hours. Cardiac Enzymes: No results for input(s): CKTOTAL, CKMB, CKMBINDEX, TROPONINI in the last 168 hours. BNP (last 3 results) No results for input(s): PROBNP in the last 8760 hours. HbA1C: No results for input(s): HGBA1C in the last 72 hours. CBG: No results for input(s): GLUCAP in the last 168 hours. Lipid Profile: No results for input(s): CHOL, HDL, LDLCALC, TRIG, CHOLHDL, LDLDIRECT in the last 72 hours. Thyroid Function Tests: No results for input(s): TSH, T4TOTAL, FREET4, T3FREE, THYROIDAB in the last 72 hours. Anemia Panel: No results for input(s): VITAMINB12, FOLATE, FERRITIN, TIBC, IRON, RETICCTPCT in the last 72  hours. Sepsis Labs: Recent Labs  Lab 06/29/19 0530  LATICACIDVEN 0.8    Recent Results (from the past 240 hour(s))  SARS Coronavirus 2 (CEPHEID - Performed in [facility] lab), Hosp Order     Status: None   Collection Time: 06/29/19  2:12 AM   Specimen: Nasopharyngeal Swab  Result Value Ref Range Status   SARS Coronavirus 2 NEGATIVE NEGATIVE Final    Comment: (NOTE) If result is NEGATIVE SARS-CoV-2 target nucleic acids are NOT DETECTED. The SARS-CoV-2 RNA is generally detectable in upper and lower  respiratory specimens during the acute phase of infection. The lowest  concentration of SARS-CoV-2 viral copies this assay can detect is 250  copies / mL. A negative result does not preclude SARS-CoV-2 infection  and should not be used as the sole basis for treatment or other  patient management decisions.  A negative result may occur with  improper specimen collection / handling, submission of specimen other  than  nasopharyngeal swab, presence of viral mutation(s) within the  areas targeted by this assay, and inadequate number of viral copies  (<250 copies / mL). A negative result must be combined with clinical  observations, patient history, and epidemiological information. If result is POSITIVE SARS-CoV-2 target nucleic acids are DETECTED. The SARS-CoV-2 RNA is generally detectable in upper and lower  respiratory specimens dur ing the acute phase of infection.  Positive  results are indicative of active infection with SARS-CoV-2.  Clinical  correlation with patient history and other diagnostic information is  necessary to determine patient infection status.  Positive results do  not rule out bacterial infection or co-infection with other viruses. If result is PRESUMPTIVE POSTIVE SARS-CoV-2 nucleic acids MAY BE PRESENT.   A presumptive positive result was obtained on the submitted specimen  and confirmed on repeat testing.  While 2019 novel coronavirus  (SARS-CoV-2) nucleic acids may be present in the submitted sample  additional confirmatory testing may be necessary for epidemiological  and / or clinical management purposes  to differentiate between  SARS-CoV-2 and other Sarbecovirus currently known to infect humans.  If clinically indicated additional testing with an alternate test  methodology [phone]([id number]) is advised. The SARS-CoV-2 RNA is generally  detectable in upper and lower respiratory sp ecimens during the acute  phase of infection. The expected result is Negative. Fact Sheet for Patients:  BoilerBrush.com.cyhttps://www.fda.gov/media/136312/download Fact Sheet for Healthcare Providers: https://pope.com/https://www.fda.gov/media/136313/download This test is not yet approved or cleared by the [place] FDA and has been authorized for detection and/or diagnosis of SARS-CoV-2 by FDA under an Emergency Use Authorization (EUA).  This EUA will remain in effect (meaning this test can be used) for the duration of the  COVID-19 declaration under Section 564(b)(1) of the Act, 21 U.S.C. section 360bbb-3(b)(1), unless the authorization is terminated or revoked sooner. Performed at [facility], [address] [place], [place] [address]   MRSA PCR Screening     Status: Abnormal   Collection Time: 06/29/19  5:30 AM   Specimen: Nasopharyngeal  Result Value Ref Range Status   MRSA by PCR POSITIVE (A) NEGATIVE Final    Comment:        The GeneXpert MRSA Assay (FDA approved for NASAL specimens only), is one component of a comprehensive MRSA colonization surveillance program. It is not intended to diagnose MRSA infection nor to guide or monitor treatment for MRSA infections. RESULT CALLED TO, READ BACK BY AND VERIFIED WITH: WEST,S @ [id number] ON [id number] BY POTEAT,S Performed at [facility], [address] [place], [place] [address]       Radiology Studies: No results found.    Scheduled Meds: . amantadine  100 mg Oral Daily  . baclofen  20 mg Oral QID  . busPIRone  5 mg Oral BID  . clonazepam  0.75 mg Oral TID  . FLUoxetine  80 mg Oral Daily  . hydroxychloroquine  200 mg Oral Daily  . lamoTRIgine  100 mg Oral QHS  . linaclotide  145 mcg Oral QAC breakfast  . Melatonin  5 mg Oral QHS  . naloxegol oxalate  25 mg Oral Daily  . oxybutynin  5 mg Oral Daily  . pantoprazole  40 mg Oral Daily  . Ensure Max Protein  11 oz Oral BID  . QUEtiapine  150 mg Oral QHS  . rivaroxaban  10 mg Oral Daily  . sodium chloride flush  3 mL Intravenous Q12H  . sodium chloride flush  3 mL Intravenous Q12H  . tiZANidine  4 mg Oral BID  . topiramate  100 mg Oral QHS  . venlafaxine XR  37.5 mg Oral Daily   Continuous Infusions: . sodium chloride Stopped (06/30/19 1448)  . piperacillin-tazobactam (ZOSYN)  IV 3.375 g (07/01/19 [id number])  . vancomycin 500 mg (07/01/19 0504)     LOS: 2 days      Time spent: 20 minutes   Noralee StainJennifer Bette Brienza, DO [facility] [email or web address]  07/01/2019, 11:59 AM

## 2019-07-02 LAB — BASIC METABOLIC PANEL
Anion gap: 8 (ref 5–15)
BUN: 7 mg/dL (ref 6–20)
CO2: 22 mmol/L (ref 22–32)
Calcium: 8.5 mg/dL — ABNORMAL LOW (ref 8.9–10.3)
Chloride: 107 mmol/L (ref 98–111)
Creatinine, Ser: 0.4 mg/dL — ABNORMAL LOW (ref 0.44–1.00)
GFR calc Af Amer: >60 mL/min (ref >60)
GFR calc non Af Amer: >60 mL/min (ref >60)
Glucose, Bld: 110 mg/dL — ABNORMAL HIGH (ref 70–99)
Potassium: 4 mmol/L (ref 3.5–5.1)
Sodium: 137 mmol/L (ref 135–145)

## 2019-07-02 LAB — CBC
HCT: 32.9 % — ABNORMAL LOW (ref 36.0–46.0)
Hemoglobin: 10.6 g/dL — ABNORMAL LOW (ref 12.0–15.0)
MCH: 30.9 pg (ref 26.0–34.0)
MCHC: 32.2 g/dL (ref 30.0–36.0)
MCV: 95.9 fL (ref 80.0–100.0)
Platelets: 265 10*3/uL (ref 150–400)
RBC: 3.43 MIL/uL — ABNORMAL LOW (ref 3.87–5.11)
RDW: 14.2 % (ref 11.5–15.5)
WBC: 8.3 10*3/uL (ref 4.0–10.5)
nRBC: 0 % (ref 0.0–0.2)

## 2019-07-02 MED ORDER — AMOXICILLIN-POT CLAVULANATE 875-125 MG PO TABS: 1.0000 | ORAL_TABLET | Freq: Two times a day (BID) | ORAL | 0 refills | Status: AC

## 2019-07-02 NOTE — Care Management Important Message (Signed)
Important Message  Patient Details IM Letter given to [name] SW to present to the Patient Name: [name] [name] MRN: [id number] Date of Birth: 01/16/1976   Medicare Important Message Given:  Yes     Kerin Salen 07/02/2019, 11:08 AM

## 2019-07-02 NOTE — TOC Transition Note (Signed)
Transition of Care [facility]) - CM/SW Discharge Note   Patient Details  Name: [name] [name] MRN: [id number] Date of Birth: 07-09-1976  Transition of Care [facility]) CM/SW Contact:  Nila Nephew, LCSW Phone Number: [phone] 07/02/2019, 11:20 AM   Clinical Narrative:   Pt discharging home today. Arranged PTAR transportation, caregiver Angie Fava [phone] to meet pt to help let her in home when ptar arrives home. Requested DC summary and result of COVID19 testing to present to pt's employer for her return to work- CSW explained process of getting documents from medical records, and that AVS being sent with pt at DC today will contain some information which she may share with any parties she desires. Agreeable. Pt was referred to [facility] recently for [facility], however since pt works outside of home, [facility] was unable to provide services. Pt/caregiver requesting outpatient PT. Provided list of area providers as they want to review/research. Pt in process of setting up additional home aide services (Current caretaker is 20 hours/week), had intake appointment with [facility] this week she will reschedule.     Social Determinants of Health (SDOH) Interventions     Readmission Risk Interventions No flowsheet data found.

## 2019-07-02 NOTE — Discharge Summary (Signed)
Physician Discharge Summary  [name] [name] YQM:[id number] DOB: [birth year] DOA: 06/28/2019  PCP: Forrest Moron, MD  Admit date: 06/28/2019 Discharge date: 07/02/2019  Admitted From: Home Disposition:  Home  Recommendations for Outpatient Follow-up:  1. Follow up with PCP in 1 week 2. Follow up with [facility] Dr. Royce Macadamia for pain management.   Discharge Condition: Stable CODE STATUS: Full  Diet recommendation: Regular diet   Brief/Interim Summary: [name] [name] a 43 y.o.femalewith medical history significant forcerebral palsy, spastic quadriplegia, neurogenic bladder, decubitus ulcers, recurrent infections with MDR organisms, chronic pain, depression, anxiety, history of PE on Xarelto, and seizure disorder, now presenting to the emergency department for evaluation of neck pain.Patient reports that she developed some pain in her neck couple weeks ago, described as severe progressively worsening. She is not sure if she has had a fever or not. She denies any cough or recent shortness of breath. CT neck soft tissue reveals asymmetric enlargement and hyperenhancement of the left parotid consistent with acute parotitis.Patient was treated with a liter of normal saline, vancomycin, Unasyn, and fentanyl in the ED. Hospitalists are asked to admit.   Discharge Diagnoses:  Principal Problem:   Acute parotitis Active Problems:   Major depressive disorder, recurrent episode ([name])   CP (cerebral palsy), spastic ([name])   Recurrent pulmonary embolism ([name])   Anxiety state   Chronic pain syndrome   HYPERTENSION, BENIGN   Cerebral palsy, quadriplegic ([name])   Seizures ([name])   Parotiditis   Acute parotitis -Presentedwith pain, swelling, and redness involving left neck and jaw, noted to have leukocytosis to 16k, and CT findings consistent with acute parotitiswith local cellulitis and myositis, but no stone, abscess, or airway compromise -She has hx of MDR organisms and  recent antibiotic use -Vanc and zosynempiric broad-spectrum antibiotics, follow clinical course -Clinically improving, remains afebrile, WBC improved to 8.3, erythema and edema of left cheek and neck has improved. Transition to Augmentin for discharge.   Cerebral palsy, spastic quadriplegia -Continue amantadine and baclofen  Chronic pain -Continue oxycodone, Zanaflex, buprenorphine.  Discussed with pharmacy regarding buprenorphine which we don't have on formulary.  Informed patient's caregiver that she may drop off the medication at the hospital front desk -Followed by pain management Dr. Royce Macadamia   Depression, anxiety -Continue Effexor, Buspar, Prozac, Seroquel, and Klonopin  History of PE -Continue Xarelto  Connective tissue disorder -Continue Plaquenil  Seizure disorder -Continue Topomax and Lamictal   Discharge Instructions  Discharge Instructions    Call MD for:  difficulty breathing, headache or visual disturbances   Complete by: As directed    Call MD for:  extreme fatigue   Complete by: As directed    Call MD for:  hives   Complete by: As directed    Call MD for:  persistant dizziness or light-headedness   Complete by: As directed    Call MD for:  persistant nausea and vomiting   Complete by: As directed    Call MD for:  severe uncontrolled pain   Complete by: As directed    Call MD for:  temperature >100.4   Complete by: As directed    Discharge instructions   Complete by: As directed    You were cared for by a hospitalist during your hospital stay. If you have any questions about your discharge medications or the care you received while you were in the hospital after you are discharged, you can call the unit and ask to speak with the hospitalist on call if the hospitalist  that took care of you is not available. Once you are discharged, your primary care physician will handle any further medical issues. Please note that NO REFILLS for any discharge  medications will be authorized once you are discharged, as it is imperative that you return to your primary care physician (or establish a relationship with a primary care physician if you do not have one) for your aftercare needs so that they can reassess your need for medications and monitor your lab values.   Increase activity slowly   Complete by: As directed      Allergies as of 07/02/2019      Reactions   Morphine Dermatitis, Other (See Comments)   Skin turned red      Medication List    TAKE these medications   acetaminophen 500 MG tablet Commonly known as: TYLENOL Take 1,000 mg by mouth 2 (two) times daily as needed for headache (pain). What changed: Another medication with the same name was removed. Continue taking this medication, and follow the directions you see here.   amantadine 100 MG capsule Commonly known as: SYMMETREL Take 100 mg by mouth daily.   amoxicillin-clavulanate 875-125 MG tablet Commonly known as: Augmentin Take 1 tablet by mouth 2 (two) times daily for 7 days.   azelastine 0.1 % nasal spray Commonly known as: ASTELIN Place 2 sprays into both nostrils 2 (two) times daily. Use in each nostril as directed   baclofen 20 MG tablet Commonly known as: LIORESAL Take 1 tablet (20 mg total) by mouth 4 (four) times daily. For bladder spasms   Belbuca 900 MCG Film Generic drug: Buprenorphine HCl Place 900 mcg inside cheek every 12 (twelve) hours.   bisacodyl 5 MG EC tablet Commonly known as: DULCOLAX Take 15 mg by mouth daily as needed (constipation).   busPIRone 5 MG tablet Commonly known as: BUSPAR Take 1 tablet (5 mg total) by mouth 2 (two) times daily.   clonazePAM 0.5 MG tablet Commonly known as: KLONOPIN Take 1.5 tablets (0.75 mg total) by mouth 3 (three) times daily.   eucerin cream Apply topically as needed for wound care. What changed:   how much to take  when to take this   feeding supplement Liqd Take 1 Container by mouth 2 (two)  times daily between meals.   feeding supplement (ENSURE ENLIVE) Liqd Take 237 mLs by mouth 2 (two) times daily between meals.   FLUoxetine 40 MG capsule Commonly known as: PROZAC Take 80 mg by mouth daily.   guaiFENesin 600 MG 12 hr tablet Commonly known as: MUCINEX Take 600 mg by mouth 2 (two) times daily as needed for cough or to loosen phlegm.   hydroxychloroquine 200 MG tablet Commonly known as: PLAQUENIL Take 200 mg by mouth daily.   lamoTRIgine 100 MG tablet Commonly known as: LAMICTAL Take 100 mg by mouth at bedtime.   Linzess 145 MCG Caps capsule Generic drug: linaclotide Take 145 mcg by mouth daily before breakfast.   Melatonin 5 MG Tabs Take 5 mg by mouth at bedtime.   Movantik 25 MG Tabs tablet Generic drug: naloxegol oxalate Take 25 mg by mouth daily.   Narcan 4 MG/0.1ML Liqd nasal spray kit Generic drug: naloxone Place 4 mg into the nose once as needed (severe opioid overdose).   omeprazole 20 MG capsule Commonly known as: PRILOSEC Take 1 capsule (20 mg total) by mouth daily.   ondansetron 4 MG tablet Commonly known as: ZOFRAN Take 1 tablet (4 mg total) by mouth every  6 (six) hours as needed for nausea.   oxybutynin 5 MG 24 hr tablet Commonly known as: DITROPAN-XL Take 5 mg by mouth daily.   Oxycodone HCl 10 MG Tabs Take 10 mg by mouth every 6 (six) hours as needed (pain).   polyethylene glycol powder 17 GM/SCOOP powder Commonly known as: GLYCOLAX/MIRALAX Take 1  1/2 dose  daily in 12 ounces of fluid every day. What changed:   how much to take  how to take this  when to take this  reasons to take this   QUEtiapine 50 MG tablet Commonly known as: SEROQUEL Take 150 mg by mouth at bedtime.   rivaroxaban 10 MG Tabs tablet Commonly known as: XARELTO Take 10 mg by mouth daily.   tiZANidine 4 MG capsule Commonly known as: ZANAFLEX Take 1 capsule (4 mg total) by mouth 2 (two) times daily.   topiramate 100 MG tablet Commonly known as:  TOPAMAX Take 1 tablet at bedtime What changed:   how much to take  how to take this  when to take this  additional instructions   venlafaxine XR 37.5 MG 24 hr capsule Commonly known as: EFFEXOR-XR Take 37.5 mg by mouth daily.      Follow-up Information    Forrest Moron, MD. Schedule an appointment as soon as possible for a visit in 1 week(s).   Specialty: Internal Medicine Contact information: [address] [place] [address] [phone]        Sherald Hess., MD Follow up.   Specialty: Family Medicine Why: For pain management  Contact information: [place] [address] [phone]          Allergies  Allergen Reactions  . Morphine Dermatitis and Other (See Comments)    Skin turned red     Consultations:  None    Procedures/Studies: Ct Soft Tissue Neck W Contrast  Result Date: 06/29/2019 CLINICAL DATA:  Initial evaluation for acute neck pain for 2 weeks, pain in left shoulder with redness and swelling to left jaw. EXAM: CT NECK WITH CONTRAST TECHNIQUE: Multidetector CT imaging of the neck was performed using the standard protocol following the bolus administration of intravenous contrast. CONTRAST:  11m OMNIPAQUE IOHEXOL 300 MG/ML  SOLN COMPARISON:  None available. FINDINGS: Pharynx and larynx: Examination mildly limited by streak artifact from extensive spinal fusion hardware. Oral cavity within normal limits without discrete mass or loculated collection. No acute abnormality about the dentition. No base of tongue lesion. Palatine tonsils not well visualized, and may be absent. Mild asymmetric fullness with mucosal edema within the left pharynx, favored to be secondary in nature to the acute inflammatory process within the left face/neck. Nasopharynx within normal limits. No visible retropharyngeal collection. Epiglottis within normal limits. Vallecula clear. Remainder of the hypopharynx and supraglottic larynx within normal  limits. Glottis symmetric and normal. Subglottic airway clear. Salivary glands: Asymmetric enlargement and hyperenhancement of the left parotid gland, suggesting acute parotitis. Associated swelling with inflammatory stranding throughout the adjacent left parotid space, extending into the left masticator and submandibular spaces. Swelling with hazy inflammatory stranding extends inferiorly along the left neck, suggesting associated regional cellulitis. No obstructive stone seen within Stensen's duct. No intraglandular abscess or other complication. Right parotid and submandibular glands are within normal limits. Mild hazy stranding about the left submandibular gland felt to be secondary in nature, with the gland itself felt to be within normal limits. Thyroid: Partially visualized thyroid grossly normal, although evaluation somewhat limited by streak artifact by  adjacent spinal fusion hardware. Lymph nodes: Prominent and mildly enlarged left level II lymph nodes measure up to 11 mm, likely reactive. No other pathologically enlarged lymph nodes identified within the neck. Vascular: Normal intravascular enhancement seen throughout the neck. Limited intracranial: Unremarkable. Visualized orbits: Visualized globes and orbital soft tissues within normal limits. Mastoids and visualized paranasal sinuses: Visualized paranasal sinuses are clear. Visualized mastoids and middle ear cavities are well pneumatized and free of fluid. Skeleton: No acute osseous finding. No discrete lytic or blastic osseous lesions. Prior ACDF at C4 through T1. Extensive posterior fusion throughout the visualized cervicothoracic spine. Upper chest: Visualized upper chest demonstrates no acute finding. Partially visualized lungs are grossly clear. Other: None. IMPRESSION: 1. Asymmetric enlargement and hyperenhancement involving the left parotid gland, consistent with acute parotitis. Diffuse swelling with inflammatory stranding within the adjacent  left face/neck compatible with associated regional cellulitis. No obstructive stone, abscess, or other complication identified. 2. Prominent and mildly enlarged left level II lymph nodes, likely reactive. Electronically Signed   By: Jeannine Boga M.D.   On: 06/29/2019 01:48      Discharge Exam: Vitals:   07/01/19 2218 07/02/19 0433  BP: (!) 115/102 (!) 133/95  Pulse: 84 74  Resp: 16 16  Temp: 97.9 F (36.6 C) 97.9 F (36.6 C)  SpO2: 99% 100%    General: Pt is alert, awake Cardiovascular: RRR, S1/S2 +, no rubs, no gallops Respiratory: CTA bilaterally, no wheezing, no rhonchi Abdominal: Soft, NT, ND, bowel sounds + Extremities: no edema, no cyanosis, contractures due to CP     The results of significant diagnostics from this hospitalization (including imaging, microbiology, ancillary and laboratory) are listed below for reference.     Microbiology: Recent Results (from the past 240 hour(s))  SARS Coronavirus 2 (CEPHEID - Performed in [facility] hospital lab), Hosp Order     Status: None   Collection Time: 06/29/19  2:12 AM   Specimen: Nasopharyngeal Swab  Result Value Ref Range Status   SARS Coronavirus 2 NEGATIVE NEGATIVE Final    Comment: (NOTE) If result is NEGATIVE SARS-CoV-2 target nucleic acids are NOT DETECTED. The SARS-CoV-2 RNA is generally detectable in upper and lower  respiratory specimens during the acute phase of infection. The lowest  concentration of SARS-CoV-2 viral copies this assay can detect is 250  copies / mL. A negative result does not preclude SARS-CoV-2 infection  and should not be used as the sole basis for treatment or other  patient management decisions.  A negative result may occur with  improper specimen collection / handling, submission of specimen other  than nasopharyngeal swab, presence of viral mutation(s) within the  areas targeted by this assay, and inadequate number of viral copies  (<250 copies / mL). A negative result must be  combined with clinical  observations, patient history, and epidemiological information. If result is POSITIVE SARS-CoV-2 target nucleic acids are DETECTED. The SARS-CoV-2 RNA is generally detectable in upper and lower  respiratory specimens dur ing the acute phase of infection.  Positive  results are indicative of active infection with SARS-CoV-2.  Clinical  correlation with patient history and other diagnostic information is  necessary to determine patient infection status.  Positive results do  not rule out bacterial infection or co-infection with other viruses. If result is PRESUMPTIVE POSTIVE SARS-CoV-2 nucleic acids MAY BE PRESENT.   A presumptive positive result was obtained on the submitted specimen  and confirmed on repeat testing.  While 2019 novel coronavirus  (SARS-CoV-2) nucleic acids  may be present in the submitted sample  additional confirmatory testing may be necessary for epidemiological  and / or clinical management purposes  to differentiate between  SARS-CoV-2 and other Sarbecovirus currently known to infect humans.  If clinically indicated additional testing with an alternate test  methodology [phone]) is advised. The SARS-CoV-2 RNA is generally  detectable in upper and lower respiratory sp ecimens during the acute  phase of infection. The expected result is Negative. Fact Sheet for Patients:  StrictlyIdeas.no Fact Sheet for Healthcare Providers: BankingDealers.co.za This test is not yet approved or cleared by the [place] FDA and has been authorized for detection and/or diagnosis of SARS-CoV-2 by FDA under an Emergency Use Authorization (EUA).  This EUA will remain in effect (meaning this test can be used) for the duration of the COVID-19 declaration under Section 564(b)(1) of the Act, 21 U.S.C. section 360bbb-3(b)(1), unless the authorization is terminated or revoked sooner. Performed at [facility], [place] [address], [place], [place] [address]   MRSA PCR Screening     Status: Abnormal   Collection Time: 06/29/19  5:30 AM   Specimen: Nasopharyngeal  Result Value Ref Range Status   MRSA by PCR POSITIVE (A) NEGATIVE Final    Comment:        The GeneXpert MRSA Assay (FDA approved for NASAL specimens only), is one component of a comprehensive MRSA colonization surveillance program. It is not intended to diagnose MRSA infection nor to guide or monitor treatment for MRSA infections. RESULT CALLED TO, READ BACK BY AND VERIFIED WITH: WEST,S @ [phone] ON [id number] BY POTEAT,S Performed at [facility] [address] [place], [place] [address]      Labs: BNP (last 3 results) No results for input(s): BNP in the last 8760 hours. Basic Metabolic Panel: Recent Labs  Lab 06/28/19 2349 06/29/19 0530 06/30/19 0617 07/01/19 0558 07/02/19 0551  NA 137 135 140 138 137  K 4.6 3.4* 3.1* 4.2 4.0  CL 107 105 108 109 107  CO2 17* 21* 22 21* 22  GLUCOSE 119* 114* 81 93 110*  BUN '18 16 10 7 7  ' CREATININE 0.51 0.49 0.47 0.42* 0.40*  CALCIUM 8.6* 8.6* 8.4* 8.3* 8.5*  MG  --   --   --  1.7  --    Liver Function Tests: Recent Labs  Lab 06/29/19 0530  AST 14*  ALT 16  ALKPHOS 74  BILITOT 0.8  PROT 6.9  ALBUMIN 3.5   No results for input(s): LIPASE, AMYLASE in the last 168 hours. No results for input(s): AMMONIA in the last 168 hours. CBC: Recent Labs  Lab 06/29/19 0038 06/29/19 0530 06/30/19 0617 07/01/19 0558 07/02/19 0551  WBC 16.0* 15.2* 11.7* 11.5* 8.3  NEUTROABS 12.7* 12.5* 8.5*  --   --   HGB 10.3* 10.1* 10.2* 9.9* 10.6*  HCT 32.6* 32.6* 32.6* 30.4* 32.9*  MCV 97.0 95.6 95.6 91.8 95.9  PLT 186 230 254 253 265   Cardiac Enzymes: No results for input(s): CKTOTAL, CKMB, CKMBINDEX, TROPONINI in the last 168 hours. BNP: Invalid input(s): POCBNP CBG: No results for input(s): GLUCAP in the last 168 hours. D-Dimer No results for input(s):  DDIMER in the last 72 hours. Hgb A1c No results for input(s): HGBA1C in the last 72 hours. Lipid Profile No results for input(s): CHOL, HDL, LDLCALC, TRIG, CHOLHDL, LDLDIRECT in the last 72 hours. Thyroid function studies No results for input(s): TSH, T4TOTAL, T3FREE, THYROIDAB in the last 72 hours.  Invalid  input(s): FREET3 Anemia work up No results for input(s): VITAMINB12, FOLATE, FERRITIN, TIBC, IRON, RETICCTPCT in the last 72 hours. Urinalysis    Component Value Date/Time   COLORURINE YELLOW 05/28/2019 1451   APPEARANCEUR CLOUDY (A) 05/28/2019 1451   LABSPEC 1.011 05/28/2019 1451   PHURINE 6.0 05/28/2019 1451   GLUCOSEU NEGATIVE 05/28/2019 1451   HGBUR LARGE (A) 05/28/2019 1451   HGBUR large 10/03/2010 1530   BILIRUBINUR NEGATIVE 05/28/2019 1451   BILIRUBINUR neg 03/15/2011 1719   KETONESUR NEGATIVE 05/28/2019 1451   PROTEINUR 100 (A) 05/28/2019 1451   UROBILINOGEN 1.0 10/08/2015 2348   NITRITE NEGATIVE 05/28/2019 1451   LEUKOCYTESUR LARGE (A) 05/28/2019 1451   LEUKOCYTESUR mod 01/26/2014   Sepsis Labs Invalid input(s): PROCALCITONIN,  WBC,  LACTICIDVEN Microbiology Recent Results (from the past 240 hour(s))  SARS Coronavirus 2 (CEPHEID - Performed in [facility] hospital lab), Hosp Order     Status: None   Collection Time: 06/29/19  2:12 AM   Specimen: Nasopharyngeal Swab  Result Value Ref Range Status   SARS Coronavirus 2 NEGATIVE NEGATIVE Final    Comment: (NOTE) If result is NEGATIVE SARS-CoV-2 target nucleic acids are NOT DETECTED. The SARS-CoV-2 RNA is generally detectable in upper and lower  respiratory specimens during the acute phase of infection. The lowest  concentration of SARS-CoV-2 viral copies this assay can detect is 250  copies / mL. A negative result does not preclude SARS-CoV-2 infection  and should not be used as the sole basis for treatment or other  patient management decisions.  A negative result may occur with  improper specimen collection  / handling, submission of specimen other  than nasopharyngeal swab, presence of viral mutation(s) within the  areas targeted by this assay, and inadequate number of viral copies  (<250 copies / mL). A negative result must be combined with clinical  observations, patient history, and epidemiological information. If result is POSITIVE SARS-CoV-2 target nucleic acids are DETECTED. The SARS-CoV-2 RNA is generally detectable in upper and lower  respiratory specimens dur ing the acute phase of infection.  Positive  results are indicative of active infection with SARS-CoV-2.  Clinical  correlation with patient history and other diagnostic information is  necessary to determine patient infection status.  Positive results do  not rule out bacterial infection or co-infection with other viruses. If result is PRESUMPTIVE POSTIVE SARS-CoV-2 nucleic acids MAY BE PRESENT.   A presumptive positive result was obtained on the submitted specimen  and confirmed on repeat testing.  While 2019 novel coronavirus  (SARS-CoV-2) nucleic acids may be present in the submitted sample  additional confirmatory testing may be necessary for epidemiological  and / or clinical management purposes  to differentiate between  SARS-CoV-2 and other Sarbecovirus currently known to infect humans.  If clinically indicated additional testing with an alternate test  methodology [phone]) is advised. The SARS-CoV-2 RNA is generally  detectable in upper and lower respiratory sp ecimens during the acute  phase of infection. The expected result is Negative. Fact Sheet for Patients:  StrictlyIdeas.no Fact Sheet for Healthcare Providers: BankingDealers.co.za This test is not yet approved or cleared by the [place] FDA and has been authorized for detection and/or diagnosis of SARS-CoV-2 by FDA under an Emergency Use Authorization (EUA).  This EUA will remain in effect (meaning this  test can be used) for the duration of the COVID-19 declaration under Section 564(b)(1) of the Act, 21 U.S.C. section 360bbb-3(b)(1), unless the authorization is terminated or revoked sooner. Performed at  [facility], [place] [address], [place], [place] [address]   MRSA PCR Screening     Status: Abnormal   Collection Time: 06/29/19  5:30 AM   Specimen: Nasopharyngeal  Result Value Ref Range Status   MRSA by PCR POSITIVE (A) NEGATIVE Final    Comment:        The GeneXpert MRSA Assay (FDA approved for NASAL specimens only), is one component of a comprehensive MRSA colonization surveillance program. It is not intended to diagnose MRSA infection nor to guide or monitor treatment for MRSA infections. RESULT CALLED TO, READ BACK BY AND VERIFIED WITH: WEST,S @ [id number] ON [id number] BY POTEAT,S Performed at [facility] [address], [place], [place] [address]      Patient was seen and examined on the day of discharge and was found to be in stable condition. Time coordinating discharge: 35 minutes including assessment and coordination of care, as well as examination of the patient.   SIGNED:  Dessa Phi, DO [facility] [email or web address] 07/02/2019, 9:58 AM

## 2019-07-07 ENCOUNTER — Telehealth: Payer: Self-pay | Admitting: Family Medicine

## 2019-07-07 MED ORDER — ONDANSETRON HCL 4 MG PO TABS: 4.0000 mg | ORAL_TABLET | Freq: Four times a day (QID) | ORAL | 0 refills | Status: AC | PRN

## 2019-07-07 NOTE — Telephone Encounter (Signed)
Pt is asking for medication for nauseous

## 2019-07-07 NOTE — Telephone Encounter (Signed)
[name] [name] called for pt. Pt got release from hosp on 07/02/19. She has a f/u appointment with Dr. Carlota Raspberry on 08/01/2019 per [name]. Pt is feeling very nauseous from the prescriptions the hosp has put her on. Pt would like to be put a script for this. Please advise at [phone]

## 2019-07-07 NOTE — Telephone Encounter (Signed)
Please notify the patient that zofran has been sent in.  Thank you.

## 2019-07-08 ENCOUNTER — Emergency Department (HOSPITAL_COMMUNITY): Payer: Medicare Other

## 2019-07-08 ENCOUNTER — Inpatient Hospital Stay (HOSPITAL_COMMUNITY)
Admission: EM | Admit: 2019-07-08 | Discharge: 2019-08-03 | DRG: 871 | Disposition: E | Payer: Medicare Other | Attending: Pulmonary Disease | Admitting: Pulmonary Disease

## 2019-07-08 ENCOUNTER — Telehealth: Payer: Self-pay | Admitting: Family Medicine

## 2019-07-08 ENCOUNTER — Encounter (HOSPITAL_COMMUNITY): Payer: Self-pay | Admitting: *Deleted

## 2019-07-08 DIAGNOSIS — G40909 Epilepsy, unspecified, not intractable, without status epilepticus: Secondary | ICD-10-CM | POA: Diagnosis present

## 2019-07-08 DIAGNOSIS — I469 Cardiac arrest, cause unspecified: Secondary | ICD-10-CM | POA: Diagnosis not present

## 2019-07-08 DIAGNOSIS — E874 Mixed disorder of acid-base balance: Secondary | ICD-10-CM | POA: Diagnosis present

## 2019-07-08 DIAGNOSIS — A419 Sepsis, unspecified organism: Principal | ICD-10-CM | POA: Diagnosis present

## 2019-07-08 DIAGNOSIS — R1930 Abdominal rigidity, unspecified site: Secondary | ICD-10-CM | POA: Diagnosis present

## 2019-07-08 DIAGNOSIS — Z79899 Other long term (current) drug therapy: Secondary | ICD-10-CM

## 2019-07-08 DIAGNOSIS — Z7901 Long term (current) use of anticoagulants: Secondary | ICD-10-CM

## 2019-07-08 DIAGNOSIS — N319 Neuromuscular dysfunction of bladder, unspecified: Secondary | ICD-10-CM | POA: Diagnosis present

## 2019-07-08 DIAGNOSIS — G800 Spastic quadriplegic cerebral palsy: Secondary | ICD-10-CM | POA: Diagnosis present

## 2019-07-08 DIAGNOSIS — R6521 Severe sepsis with septic shock: Secondary | ICD-10-CM

## 2019-07-08 DIAGNOSIS — Z90710 Acquired absence of both cervix and uterus: Secondary | ICD-10-CM

## 2019-07-08 DIAGNOSIS — M79A3 Nontraumatic compartment syndrome of abdomen: Secondary | ICD-10-CM

## 2019-07-08 DIAGNOSIS — Z800 Family history of malignant neoplasm of digestive organs: Secondary | ICD-10-CM

## 2019-07-08 DIAGNOSIS — Z825 Family history of asthma and other chronic lower respiratory diseases: Secondary | ICD-10-CM

## 2019-07-08 DIAGNOSIS — K56609 Unspecified intestinal obstruction, unspecified as to partial versus complete obstruction: Secondary | ICD-10-CM | POA: Diagnosis present

## 2019-07-08 DIAGNOSIS — Z885 Allergy status to narcotic agent status: Secondary | ICD-10-CM

## 2019-07-08 DIAGNOSIS — G931 Anoxic brain damage, not elsewhere classified: Secondary | ICD-10-CM | POA: Diagnosis present

## 2019-07-08 DIAGNOSIS — N179 Acute kidney failure, unspecified: Secondary | ICD-10-CM | POA: Diagnosis present

## 2019-07-08 DIAGNOSIS — K5909 Other constipation: Secondary | ICD-10-CM | POA: Diagnosis present

## 2019-07-08 DIAGNOSIS — Z8249 Family history of ischemic heart disease and other diseases of the circulatory system: Secondary | ICD-10-CM

## 2019-07-08 DIAGNOSIS — Z86711 Personal history of pulmonary embolism: Secondary | ICD-10-CM

## 2019-07-08 DIAGNOSIS — F329 Major depressive disorder, single episode, unspecified: Secondary | ICD-10-CM | POA: Diagnosis present

## 2019-07-08 DIAGNOSIS — F429 Obsessive-compulsive disorder, unspecified: Secondary | ICD-10-CM | POA: Diagnosis present

## 2019-07-08 DIAGNOSIS — Z79891 Long term (current) use of opiate analgesic: Secondary | ICD-10-CM

## 2019-07-08 DIAGNOSIS — Z803 Family history of malignant neoplasm of breast: Secondary | ICD-10-CM

## 2019-07-08 DIAGNOSIS — Z960 Presence of urogenital implants: Secondary | ICD-10-CM | POA: Diagnosis present

## 2019-07-08 DIAGNOSIS — Z20828 Contact with and (suspected) exposure to other viral communicable diseases: Secondary | ICD-10-CM | POA: Diagnosis present

## 2019-07-08 DIAGNOSIS — J9601 Acute respiratory failure with hypoxia: Secondary | ICD-10-CM | POA: Diagnosis present

## 2019-07-08 DIAGNOSIS — Z87440 Personal history of urinary (tract) infections: Secondary | ICD-10-CM

## 2019-07-08 DIAGNOSIS — E875 Hyperkalemia: Secondary | ICD-10-CM | POA: Diagnosis present

## 2019-07-08 DIAGNOSIS — E872 Acidosis: Secondary | ICD-10-CM | POA: Diagnosis not present

## 2019-07-08 DIAGNOSIS — Z820 Family history of epilepsy and other diseases of the nervous system: Secondary | ICD-10-CM

## 2019-07-08 DIAGNOSIS — Z9049 Acquired absence of other specified parts of digestive tract: Secondary | ICD-10-CM

## 2019-07-08 DIAGNOSIS — R64 Cachexia: Secondary | ICD-10-CM | POA: Diagnosis present

## 2019-07-08 DIAGNOSIS — I10 Essential (primary) hypertension: Secondary | ICD-10-CM | POA: Diagnosis present

## 2019-07-08 LAB — POCT I-STAT 7, (LYTES, BLD GAS, ICA,H+H)
Acid-base deficit: 10 mmol/L — ABNORMAL HIGH (ref 0.0–2.0)
Bicarbonate: 21.4 mmol/L (ref 20.0–28.0)
Calcium, Ion: 1.09 mmol/L — ABNORMAL LOW (ref 1.15–1.40)
HCT: 31 % — ABNORMAL LOW (ref 36.0–46.0)
Hemoglobin: 10.5 g/dL — ABNORMAL LOW (ref 12.0–15.0)
O2 Saturation: 94 %
Patient temperature: 98.6
Potassium: 5.1 mmol/L (ref 3.5–5.1)
Sodium: 151 mmol/L — ABNORMAL HIGH (ref 135–145)
TCO2: 24 mmol/L (ref 22–32)
pCO2 arterial: 79.7 mmHg (ref 32.0–48.0)
pH, Arterial: 7.036 — CL (ref 7.350–7.450)
pO2, Arterial: 103 mmHg (ref 83.0–108.0)

## 2019-07-08 MED ORDER — SODIUM BICARBONATE 8.4 % IV SOLN
INTRAVENOUS | Status: AC
  Administered 2019-07-08: 22:00:00 50 meq via INTRAVENOUS
  Filled 2019-07-08: qty 150

## 2019-07-08 NOTE — ED Notes (Signed)
PCXR at bedside.

## 2019-07-08 NOTE — ED Triage Notes (Signed)
Pt arrives via [facility]. Pt was eating ice cream and became unresponsive, caretaker gave narcan because pt had recent change in her opioid pain medication. Then, she initiated cpr that was done for about 5 minutes prior to to EMS arrival. On arrival to scene, pt was apneic and pulseless. CPR was initiated, 6 epi given. CPR for about 28 minutes. Pt then had organized rhythm (SR with PAC in the 80's). ECO2 35/40.  Epi drip initiated and stopped PTA. King airway in place on arrival. Foley catheter in place PTA. 1 liter fluid given en route. I/O established in the right leg.

## 2019-07-08 NOTE — ED Provider Notes (Signed)
[facility] EMERGENCY DEPARTMENT Provider Note   CSN: [id number] Arrival date & time: 07/12/2019  2116    History   Chief Complaint Chief Complaint  Patient presents with   Post CPR    HPI [name] [name] is a 43 y.o. female.    Level 5 caveat due to nonverbal status. HPI Patient presented post CPR.  Reportedly was eating ice cream and went unresponsive.  Loss vitals.  EMS did CPR for 28 minutes and had return of vitals after 6 epinephrines.  King airway placed by them.  Patient is a full code.  Had been given Narcan by caregiver at home.  Reportedly had recent increase in her pain medicines.  Has a history of cerebral palsy. Past Medical History:  Diagnosis Date   Acute respiratory failure with hypoxia ([name])    Anemia    Cellulitis 12/22/2014   Cerebral palsy ([name])    Spastic Cerebral palsy, mentally intact   Contracture, joint, multiple sites    Electric wheelchair, uses left hand to operate chair.    Depression    [name]    Dysarthria    GERD (gastroesophageal reflux disease)    Gram positive sepsis ([place]) 10/24/2014   HCAP (healthcare-associated pneumonia)    2015, 2018   History of endometriosis 10/2012   OBGYN [facility]: Laparoscopic Endometrial Ablation, TVH,   History of recurrent UTIs    Hypertension    Incontinent of feces 03/11/2016   Intracervical pessary 05/07/2011   Overview:  Overview:  Placed by [place] 04/30/11 to treat DUB and Endometriosis.  She is seen at GYN clinic at [facility] but was considered a poor surgical candidate and referred her to [facility].  For DUB she had pelvic ultrasound that showed thin stripe and she had endometrial Bx by Dr Nori Riis in [place] clinic, which was negative.      Macroscopic hematuria 03/10/2013   status post replacement of suprapubic tube 03/04/2013    Migraine    OCD (obsessive compulsive disorder)    Parapneumonic effusion 10/25/2014   Presence of intrathecal baclofen  pump 03/07/2014   R LQ. Insertion T10.     Psychiatric illness 10/31/2014   Recurrent pulmonary embolism ([name])    Pt has history of recurrent PE and is on lifelong coumadin.    Seizures ([place]) 02/12/2016   Spasticity 01/05/2014   Transient alteration of awareness, recurrent 09/08/2006   Recurrent episodes where [name] [name]  loses contact with her world and that have been studied thoroughly and appear nonepileptic in nature per Dr Krista Blue (neurology) The patient has had episodes starting in 2007 that initially began 1-4 times a day during which time she would have periods of unawareness followed by confusion. This continuous video EEG monitoring performed from October 8-12, 2007. Had    Urethra dilated and patulous 07/03/2015   Patulous, Dilated Urethra. 14m balloon on a 22-Fr catheter hoping this would prevent the bladder spasm from pushing the balloon down the urethra (Dr EAmalia [facility], July 2016)     Patient Active Problem List   Diagnosis Date Noted   Acute parotitis 06/29/2019   Parotiditis 06/29/2019   Pressure injury of buttock, stage 2 ([facility] 04/20/2019   Palliative care encounter    Cough 01/04/2019   Bad taste in mouth 01/04/2019   Hypotension 11/19/2018   Urinary tract infection associated with indwelling urethral catheter ([facility] 11/18/2018   Solitary pulmonary nodule on lung CT 10/28/2018   Connective tissue disorder ([facility] 08/07/2018  Migraine without aura and without status migrainosus, not intractable 06/26/2018   UTI (urinary tract infection) 05/25/2018   Headache 05/25/2018   Incontinent of feces 03/11/2016   Chronic anticoagulation 03/11/2016   Involuntary movements 02/22/2016   Seizures ([place]) 02/12/2016   Dyspnea 02/12/2016   Cerebral palsy, quadriplegic ([place]) 02/06/2016   Swelling of extremity 01/13/2016   Nausea without vomiting 11/09/2015   Fever    Pressure ulcer 10/09/2015   Status post insertion of inferior vena caval filter 09/28/2015    Absence of bladder continence 08/25/2015   Person living in residential institution 06/14/2015   Pulmonary hypertension ([place])    Anemia of chronic disease    Spasticity 03/15/2015   Constipation, chronic 10/10/2014   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   Dystonia 04/12/2014   UTI (lower urinary tract infection) 03/16/2014   Presence of intrathecal baclofen pump 03/07/2014   Daytime sleepiness 10/16/2013   Dental caries 07/18/2013   Multiple thyroid nodules 05/20/2013   Osteoarthrosis 04/07/2013   [name]    Dysarthria    GERD (gastroesophageal reflux disease)    Neurogenic bladder 02/05/2013   Anxiety state 10/21/2012   Chronic pain syndrome 06/17/2012   Recurrent pulmonary embolism ([place]) 05/03/2011   Pulmonary embolism with infarction ([place]) 05/03/2011   Chronic suprapubic catheter ([place]) 03/15/2011   Disease of female genital organs 10/24/2010   HYPERTENSION, BENIGN 01/31/2010   CP (cerebral palsy), spastic ([place]) 12/27/2008   Contracted, joint, multiple sites 12/27/2008   Major depressive disorder, recurrent episode ([place]) 01/29/2007   Obsessive Compulsive Disorder 01/29/2007   Transient alteration of awareness, recurrent 09/08/2006    Past Surgical History:  Procedure Laterality Date   ABDOMINAL HYSTERECTOMY     APPENDECTOMY     BACLOFEN PUMP REFILL     x 3 times   CARPAL TUNNEL RELEASE  08/2008   Dr Daylene Katayama   CESAREAN SECTION     x 2   CHOLECYSTECTOMY  10/14/2015   Procedure: LAPAROSCOPIC CHOLECYSTECTOMY;  Surgeon: Georganna Skeans, MD;  Location: [place];  Service: General;;   COLPOSCOPY  06/2000   INTRAUTERINE DEVICE INSERTION  04/30/11   Inserted by [place] for endometriosis   LAPAROSCOPIC ASSISTED VAGINAL HYSTERECTOMY  10/27/2012   MULTIPLE EXTRACTIONS WITH ALVEOLOPLASTY Bilateral 07/19/2013   Procedure: EXTRACTIONS #4, 1,09,32,35;  Surgeon: Isac Caddy, DDS;  Location: [place];   Service: Oral Surgery;  Laterality: Bilateral;   NEUROMA SURGERY Left    anterior and posterior intersosseous   PAIN PUMP IMPLANTATION N/A 03/07/2014   Procedure: baclofen pump revision/replacement and Catheter connection replacement;  Surgeon: Ophelia Charter, MD;  Location: [place] [facility];  Service: Neurosurgery;  Laterality: N/A;  baclofen pump revision/replacement and Catheter connection replacement   PROGRAMABLE BACLOFEN PUMP REVISION  03/17/14   Battery Replacement    TUBAL LIGATION  2003   URETHRA SURGERY     WRIST SURGERY  06/2010   Dr Nicoletta Dress, hand surgeon, [name]     OB History   No obstetric history on file.      Home Medications    Prior to Admission medications   Medication Sig Start Date End Date Taking? Authorizing Provider  acetaminophen (TYLENOL) 500 MG tablet Take 1,000 mg by mouth 2 (two) times daily as needed for headache (pain).    [provider]  amantadine (SYMMETREL) 100 MG capsule Take 100 mg by mouth daily.     [provider]  amoxicillin-clavulanate (AUGMENTIN) 875-125 MG tablet Take 1 tablet by mouth  2 (two) times daily for 7 days. 07/02/19 July 24, 2019  Dessa Phi, DO  azelastine (ASTELIN) 0.1 % nasal spray Place 2 sprays into both nostrils 2 (two) times daily. Use in each nostril as directed    [provider]  baclofen (LIORESAL) 20 MG tablet Take 1 tablet (20 mg total) by mouth 4 (four) times daily. For bladder spasms 12/28/18   Rockwell Germany, NP  bisacodyl (DULCOLAX) 5 MG EC tablet Take 15 mg by mouth daily as needed (constipation).    [provider]  Buprenorphine HCl (BELBUCA) 900 MCG FILM Place 900 mcg inside cheek every 12 (twelve) hours.    [provider]  busPIRone (BUSPAR) 5 MG tablet Take 1 tablet (5 mg total) by mouth 2 (two) times daily. 06/23/19   Forrest Moron, MD  clonazePAM (KLONOPIN) 0.5 MG tablet Take 1.5 tablets (0.75 mg total) by mouth 3 (three) times daily. 11/27/17   Mariel Aloe,  MD  feeding supplement (BOOST HIGH PROTEIN) LIQD Take 1 Container by mouth 2 (two) times daily between meals.    [provider]  feeding supplement, ENSURE ENLIVE, (ENSURE ENLIVE) LIQD Take 237 mLs by mouth 2 (two) times daily between meals. Patient not taking: Reported on 06/23/2019 05/30/18   Kayleen Memos, DO  FLUoxetine (PROZAC) 40 MG capsule Take 80 mg by mouth daily.    [provider]  guaiFENesin (MUCINEX) 600 MG 12 hr tablet Take 600 mg by mouth 2 (two) times daily as needed for cough or to loosen phlegm.     [provider]  hydroxychloroquine (PLAQUENIL) 200 MG tablet Take 200 mg by mouth daily.    [provider]  lamoTRIgine (LAMICTAL) 100 MG tablet Take 100 mg by mouth at bedtime.    [provider]  linaclotide (LINZESS) 145 MCG CAPS capsule Take 145 mcg by mouth daily before breakfast.     [provider]  Melatonin 5 MG TABS Take 5 mg by mouth at bedtime.     [provider]  naloxegol oxalate (MOVANTIK) 25 MG TABS tablet Take 25 mg by mouth daily.    [provider]  NARCAN 4 MG/0.1ML LIQD nasal spray kit Place 4 mg into the nose once as needed (severe opioid overdose).  02/24/19   [provider]  omeprazole (PRILOSEC) 20 MG capsule Take 1 capsule (20 mg total) by mouth daily. 01/04/19   Horald Pollen, MD  ondansetron (ZOFRAN) 4 MG tablet Take 1 tablet (4 mg total) by mouth every 6 (six) hours as needed for nausea. 07/07/19   Forrest Moron, MD  oxybutynin (DITROPAN-XL) 5 MG 24 hr tablet Take 5 mg by mouth daily.    [provider]  Oxycodone HCl 10 MG TABS Take 10 mg by mouth every 6 (six) hours as needed (pain).    [provider]  polyethylene glycol powder (GLYCOLAX/MIRALAX) powder Take 1  1/2 dose  daily in 12 ounces of fluid every day. Patient taking differently: Take 0.5-1 Containers by mouth daily as needed for moderate constipation. Take 1  1/2 dose  daily in 12 ounces  of fluid every day. 01/01/17   Esterwood, [name] S, PA-C  QUEtiapine (SEROQUEL) 50 MG tablet Take 150 mg by mouth at bedtime.  01/08/19   [provider]  rivaroxaban (XARELTO) 10 MG TABS tablet Take 10 mg by mouth daily.     [provider]  Skin Protectants, Misc. (EUCERIN) cream Apply topically as needed for wound care. Patient  taking differently: Apply 1 application topically daily as needed for wound care.  02/05/19   Albrizze, Kaitlyn E, PA-C  tiZANidine (ZANAFLEX) 4 MG capsule Take 1 capsule (4 mg total) by mouth 2 (two) times daily. 12/28/18   Rockwell Germany, NP  topiramate (TOPAMAX) 100 MG tablet Take 1 tablet at bedtime Patient taking differently: Take 100 mg by mouth at bedtime.  02/10/19   Rockwell Germany, NP  venlafaxine XR (EFFEXOR-XR) 37.5 MG 24 hr capsule Take 37.5 mg by mouth daily. 01/08/19   [provider]    Family History Family History  Problem Relation Age of Onset   Asthma Father    Colon cancer Maternal Grandmother        Died in her 46's   Breast cancer Paternal Grandmother        Died in her 85's   Heart attack Maternal Grandfather        Died in his 24's   Alzheimer's disease Paternal Grandfather        Died in his 31's   Breast cancer Mother    Stomach cancer Neg Hx     Social History Social History   Tobacco Use   Smoking status: Never Smoker   Smokeless tobacco: Never Used  Substance Use Topics   Alcohol use: Yes    Alcohol/week: 1.0 standard drinks    Types: 1 Glasses of wine per week    Comment: Drinks alcohol once a month.   Drug use: No     Allergies   Morphine   Review of Systems Review of Systems  Unable to perform ROS: Patient unresponsive     Physical Exam Updated Vital Signs BP (!) 89/66    Pulse 78    Temp (!) 94.2 F (34.6 C) (Rectal)    Resp 19    LMP 04/05/2011    SpO2 100%   Physical Exam Vitals signs and nursing note reviewed.  HENT:     Head: Atraumatic.  Eyes:     Comments:  Pupils dilated fixed without threat response.  Neck:     Comments: King airway in place.  JVD. Cardiovascular:     Rate and Rhythm: Regular rhythm.  Pulmonary:     Breath sounds: No wheezing or rhonchi.  Abdominal:     General: There is distension.  Musculoskeletal:     Right lower leg: No edema.     Left lower leg: No edema.  Skin:    Capillary Refill: Capillary refill takes more than 3 seconds.     Coloration: Skin is not jaundiced.  Neurological:     Comments: Patient unresponsive.  Pupils fixed and dilated.  No response to pain.  Does have some spontaneous respirations.      ED Treatments / Results  Labs (all labs ordered are listed, but only abnormal results are displayed) Labs Reviewed  POCT I-STAT 7, (LYTES, BLD GAS, ICA,H+H) - Abnormal; Notable for the following components:      Result Value   pH, Arterial 7.036 (*)    pCO2 arterial 79.7 (*)    Acid-base deficit 10.0 (*)    Sodium 151 (*)    Calcium, Ion 1.09 (*)    HCT 31.0 (*)    Hemoglobin 10.5 (*)    All other components within normal limits  CULTURE, BLOOD (ROUTINE X 2)  CULTURE, BLOOD (ROUTINE X 2)  SARS CORONAVIRUS 2 ([facility])  LACTIC ACID, PLASMA  LACTIC ACID, PLASMA  COMPREHENSIVE METABOLIC  PANEL  URINALYSIS, ROUTINE W REFLEX MICROSCOPIC  CBC WITH DIFFERENTIAL/PLATELET  I-STAT ARTERIAL BLOOD GAS, ED    EKG EKG Interpretation  Date/Time:  Thursday July 08 2019 21:21:53 EDT Ventricular Rate:  74 PR Interval:    QRS Duration: 118 QT Interval:  458 QTC Calculation: 509 R Axis:   67 Text Interpretation:  Sinus rhythm Low voltage with right axis deviation Probable left ventricular hypertrophy Borderline prolonged QT interval Confirmed by Davonna Belling [phone]) on 07/10/2019 11:12:06 PM   Radiology No results found.  Procedures Procedures (including critical care time)  Medications Ordered in ED Medications  sodium bicarbonate 1 mEq/mL  injection (has no administration in time range)     Initial Impression / Assessment and Plan / ED Course  I have reviewed the triage vital signs and the nursing notes.  Pertinent labs & imaging results that were available during my care of the patient were reviewed by me and considered in my medical decision making (see chart for details).        Patient presented after cardiac arrest.  Downtime of 28 minutes with return of vitals to a sinus rhythm.  Patient is a full code.  ICU contacted.  King airway change over to ET tube.  Central line placed.  Patient started on Levophed drip.  She is on anticoagulation.  Admit to ICU.   CRITICAL CARE Performed by: Davonna Belling Total critical care time: 30 minutes Critical care time was exclusive of separately billable procedures and treating other patients. Critical care was necessary to treat or prevent imminent or life-threatening deterioration. Critical care was time spent personally by me on the following activities: development of treatment plan with patient and/or surrogate as well as nursing, discussions with consultants, evaluation of patient's response to treatment, examination of patient, obtaining history from patient or surrogate, ordering and performing treatments and interventions, ordering and review of laboratory studies, ordering and review of radiographic studies, pulse oximetry and re-evaluation of patient's condition.   Final Clinical Impressions(s) / ED Diagnoses   Final diagnoses:  Cardiac arrest [facility])    ED Discharge Orders    None       Davonna Belling, MD 07/03/2019 2324

## 2019-07-08 NOTE — ED Notes (Signed)
edp aware we were unable to obtain labs and central line material at the bedside

## 2019-07-08 NOTE — ED Notes (Signed)
Family at bedside. 

## 2019-07-08 NOTE — ED Notes (Signed)
Resident at the bedside for central line placement/obtain labs

## 2019-07-08 NOTE — H&P (Addendum)
PULMONARY / CRITICAL CARE MEDICINE   NAME:  [name] [name], MRN:  [id number], DOB:  [birth year], LOS: 0 ADMISSION DATE:  07/24/2019, CONSULTATION DATE: 07/31/2019 REFERRING MD: Dr Alvino Chapel, CHIEF COMPLAINT: Cardiac arrest BRIEF HISTORY:    43 year old lady with cerebral palsy status post prolonged cardiac arrest  HISTORY OF PRESENT ILLNESS   43 year old lady with history of cerebral palsy and quadriplegia, neurogenic bladder, decubitus ulcer, recurrent infection with MDR organisms, history of PE on Xarelto, seizure disorder on chronic narcotics for spasticity Who was found unresponsive by her caregiver given Narcan with no improvement CPR was initiated for about 5 minutes prior to EMS arrival and then had 6 rounds of epinephrine with total CPR around 28 minutes before ROSC.  Patient was intubated with a King airway and then had to be exchanged when she came into the emergency room.  According to reports patient has been nauseated all day and had recurrent vomiting.  I was called to assess the patient by the time I came to assess the patient she had a blood pressure of 45/26 with heart rate of 58 she has not had labs done yet and no basic work-up yet.  Patient is unresponsive and obviously not able to give any history and most of the history was taken from the chart and from her father who was not there at the time of the event.  Patient has fixed dilated pupils not responsive.  I asked the nurse to give 3 ampoules of sodium bicarb and start patient on Levophed drip.  I also ask for stat CT scan of the head abdomen and pelvis and to obtain an IV access.  Labs were pending.  During evaluation I had a concern about abdominal compartment syndrome since her exam was very impressive with rigid abdomen and hard to ventilate.  I did speak with Dr. Redmond Pulling from general surgery who is waiting on the results of the CT scan before he make any decisions.   SIGNIFICANT EVENTS:  Cardiac arrest 07/29/2019 STUDIES:       CULTURES:    ANTIBIOTICS:  Zosyn 07-27-19>>>  LINES/TUBES:   ETT 08/02/2019 CONSULTANTS:    SUBJECTIVE:    CONSTITUTIONAL: BP (!) 89/66   Pulse 78   Temp (!) 94.2 F (34.6 C) (Rectal)   Resp 19   LMP 04/05/2011   SpO2 100%   No intake/output data recorded.     Vent Mode: PRVC FiO2 (%):  [100 %] 100 % Set Rate:  [16 bmp-18 bmp] 18 bmp Vt Set:  [380 mL] 380 mL PEEP:  [10 cmH20] 10 cmH20 Plateau Pressure:  [42 cmH20-51 cmH20] 51 cmH20  PHYSICAL EXAM: General: Looks acutely ill emaciated Neuro: Pupils 6 mm dilated fixed no gag reflex no breathing over the ventilator HEENT: Orally intubated Cardiovascular: Normal heart sound no added sounds or murmurs Lungs: Clear equal air sounds bilateral no crackles no wheezing Abdomen: Rigid abdomen Musculoskeletal: Contractures  skin: No rash  RESOLVED PROBLEM LIST   ASSESSMENT AND PLAN   Assessment: -Acute hypoxemic respiratory failure requiring mechanical ventilation  - cardiac arrest -Septic shock -Small bowel obstruction ?Ileus related to narcotics -Concern for abdominal compartment syndrome -Acute kidney injury -Acute metabolic encephalopathy worrisome for anoxic brain injury -Hyperkalemia -Severe metabolic and respiratory acidosis -Lactic acidosis -History of cerebral palsy -History of PE on home anticoagulation  Plan: -Stat CT scan of the head -Stat CT scan of the abdomen and pelvis -Insert OG tube and connect to suction (OG tube was coiling advised nurse to reinsert). -  Stat labs most labs were not done at the time of evaluated patient -Stat chest x-ray -Start Levophed drip titrate to keep map above 65 -Start sodium bicarb drip -Give 3 ampoules of sodium bicarb -Obtain central line discussed with emergency room physician -Start empiric Zosyn -Repeat ABG -Discussed goals of care with the father and want to continue all aggressive care measures for now -Hold home Xarelto -Discussed with Dr. Greer Pickerel from general surgery about concern about SBO and  abdominal compartment syndrome and advised to follow-up after the results of CT scan.  Patient is a very high risk due to her multiorgan failure and coagulopathy. -Check intra-abdominal pressure -I will hold off on therapeutic hypothermia since potential going for surgery and worsening coagulopathy and also due to her prolonged cardiac arrest and poor neurological status to start with. -Palliative care consult in the morning.  I have spent 70 minutes of critical care time this time was spent at bedside or in the unit this time was exclusive any billable procedures.   Patient has overall poor prognosis with very high risk for mortality.   SUMMARY OF TODAY'S PLAN:    Best Practice / Goals of Care / Disposition.   DVT PROPHYLAXIS: Patient is on home Xarelto will hold for now SUP: PPI NUTRITION: N.p.o. MOBILITY: Bedrest GOALS OF CARE: Full code FAMILY DISCUSSIONS: Discussed with father about goals of care he wants continue all aggressive measures including full code and I discussed with his son DISPOSITION ICU admission  LABS  Glucose No results for input(s): GLUCAP in the last 168 hours.  BMET Recent Labs  Lab 07/02/19 0551 07/19/2019 2215  NA 137 151*  K 4.0 5.1  CL 107  --   CO2 22  --   BUN 7  --   CREATININE 0.40*  --   GLUCOSE 110*  --    BMP Latest Ref Rng & Units 07/06/2019 07/28/2019 07/02/2019  Glucose 70 - 99 mg/dL 113(H) - 110(H)  BUN 6 - 20 mg/dL 30(H) - 7  Creatinine 0.44 - 1.00 mg/dL 2.58(H) - 0.40(L)  Sodium 135 - 145 mmol/L 145 151(H) 137  Potassium 3.5 - 5.1 mmol/L 5.0 5.1 4.0  Chloride 98 - 111 mmol/L 104 - 107  CO2 22 - 32 mmol/L 12(L) - 22  Calcium 8.9 - 10.3 mg/dL 9.0 - 8.5(L)   Liver Enzymes No results for input(s): AST, ALT, ALKPHOS, BILITOT, ALBUMIN in the last 168 hours.  Electrolytes Recent Labs  Lab 07/02/19 0551  CALCIUM 8.5*    CBC Recent Labs  Lab 07/02/19 0551 07/18/2019 2215   WBC 8.3  --   HGB 10.6* 10.5*  HCT 32.9* 31.0*  PLT 265  --      CBC Latest Ref Rng & Units 07/30/2019 08/02/2019 07/02/2019  WBC 4.0 - 10.5 K/uL PENDING - 8.3  Hemoglobin 12.0 - 15.0 g/dL 11.6(L) 10.5(L) 10.6(L)  Hematocrit 36.0 - 46.0 % 38.2 31.0(L) 32.9(L)  Platelets 150 - 400 K/uL 602(H) - 265   ABG Recent Labs  Lab 07/17/2019 2215  PHART 7.036*  PCO2ART 79.7*  PO2ART 103.0    Coag's No results for input(s): APTT, INR in the last 168 hours.  Sepsis Markers No results for input(s): LATICACIDVEN, PROCALCITON, O2SATVEN in the last 168 hours.  Cardiac Enzymes No results for input(s): TROPONINI, PROBNP in the last 168 hours.  PAST MEDICAL HISTORY :   She  has a past medical history of Acute respiratory failure with hypoxia ([place]), Anemia, Cellulitis (12/22/2014), Cerebral  palsy ([place]), Contracture, joint, multiple sites, Depression, [name], Dysarthria, GERD (gastroesophageal reflux disease), Gram positive sepsis ([place]) (10/24/2014), HCAP (healthcare-associated pneumonia), History of endometriosis (10/2012), History of recurrent UTIs, Hypertension, Incontinent of feces (03/11/2016), Intracervical pessary (05/07/2011), Macroscopic hematuria (03/10/2013), Migraine, OCD (obsessive compulsive disorder), Parapneumonic effusion (10/25/2014), Presence of intrathecal baclofen pump (03/07/2014), Psychiatric illness (10/31/2014), Recurrent pulmonary embolism ([place]), Seizures ([place]) (02/12/2016), Spasticity (01/05/2014), Transient alteration of awareness, recurrent (09/08/2006), and Urethra dilated and patulous (07/03/2015).  PAST SURGICAL HISTORY:  She  has a past surgical history that includes Colposcopy (06/2000); Cesarean section; Tubal ligation (2003); Carpal tunnel release (08/2008); Wrist surgery (06/2010); Baclofen pump refill; Appendectomy; Intrauterine device insertion (04/30/11); Multiple extractions with alveoloplasty (Bilateral, 07/19/2013); Pain pump implantation (N/A, 03/07/2014);  Programable baclofen pump revision (03/17/14); Cholecystectomy (10/14/2015); Abdominal hysterectomy; Neuroma surgery (Left); Urethra surgery; and Laparoscopic assisted vaginal hysterectomy (10/27/2012).  Allergies  Allergen Reactions  . Morphine Dermatitis and Other (See Comments)    Skin turned red     No current facility-administered medications on file prior to encounter.    Current Outpatient Medications on File Prior to Encounter  Medication Sig  . acetaminophen (TYLENOL) 500 MG tablet Take 1,000 mg by mouth 2 (two) times daily as needed for headache (pain).  [name] Kitchen amantadine (SYMMETREL) 100 MG capsule Take 100 mg by mouth daily.   [name] Kitchen amoxicillin-clavulanate (AUGMENTIN) 875-125 MG tablet Take 1 tablet by mouth 2 (two) times daily for 7 days.  [name] Kitchen azelastine (ASTELIN) 0.1 % nasal spray Place 2 sprays into both nostrils 2 (two) times daily. Use in each nostril as directed  . baclofen (LIORESAL) 20 MG tablet Take 1 tablet (20 mg total) by mouth 4 (four) times daily. For bladder spasms  . bisacodyl (DULCOLAX) 5 MG EC tablet Take 15 mg by mouth daily as needed (constipation).  . Buprenorphine HCl (BELBUCA) 900 MCG FILM Place 900 mcg inside cheek every 12 (twelve) hours.  . busPIRone (BUSPAR) 5 MG tablet Take 1 tablet (5 mg total) by mouth 2 (two) times daily.  . clonazePAM (KLONOPIN) 0.5 MG tablet Take 1.5 tablets (0.75 mg total) by mouth 3 (three) times daily.  . feeding supplement (BOOST HIGH PROTEIN) LIQD Take 1 Container by mouth 2 (two) times daily between meals.  . feeding supplement, ENSURE ENLIVE, (ENSURE ENLIVE) LIQD Take 237 mLs by mouth 2 (two) times daily between meals. (Patient not taking: Reported on 06/23/2019)  . FLUoxetine (PROZAC) 40 MG capsule Take 80 mg by mouth daily.  [name] Kitchen guaiFENesin (MUCINEX) 600 MG 12 hr tablet Take 600 mg by mouth 2 (two) times daily as needed for cough or to loosen phlegm.   . hydroxychloroquine (PLAQUENIL) 200 MG tablet Take 200 mg by mouth daily.  [name] Kitchen  lamoTRIgine (LAMICTAL) 100 MG tablet Take 100 mg by mouth at bedtime.  [name] Kitchen linaclotide (LINZESS) 145 MCG CAPS capsule Take 145 mcg by mouth daily before breakfast.   . Melatonin 5 MG TABS Take 5 mg by mouth at bedtime.   . naloxegol oxalate (MOVANTIK) 25 MG TABS tablet Take 25 mg by mouth daily.  [name] Kitchen NARCAN 4 MG/0.1ML LIQD nasal spray kit Place 4 mg into the nose once as needed (severe opioid overdose).   [name] Kitchen omeprazole (PRILOSEC) 20 MG capsule Take 1 capsule (20 mg total) by mouth daily.  . ondansetron (ZOFRAN) 4 MG tablet Take 1 tablet (4 mg total) by mouth every 6 (six) hours as needed for nausea.  [name] Kitchen oxybutynin (DITROPAN-XL) 5 MG 24 hr tablet Take 5 mg by  mouth daily.  . Oxycodone HCl 10 MG TABS Take 10 mg by mouth every 6 (six) hours as needed (pain).  . polyethylene glycol powder (GLYCOLAX/MIRALAX) powder Take 1  1/2 dose  daily in 12 ounces of fluid every day. (Patient taking differently: Take 0.5-1 Containers by mouth daily as needed for moderate constipation. Take 1  1/2 dose  daily in 12 ounces of fluid every day.)  . QUEtiapine (SEROQUEL) 50 MG tablet Take 150 mg by mouth at bedtime.   . rivaroxaban (XARELTO) 10 MG TABS tablet Take 10 mg by mouth daily.   . Skin Protectants, Misc. (EUCERIN) cream Apply topically as needed for wound care. (Patient taking differently: Apply 1 application topically daily as needed for wound care. )  . tiZANidine (ZANAFLEX) 4 MG capsule Take 1 capsule (4 mg total) by mouth 2 (two) times daily.  [name] Kitchen topiramate (TOPAMAX) 100 MG tablet Take 1 tablet at bedtime (Patient taking differently: Take 100 mg by mouth at bedtime. )  . venlafaxine XR (EFFEXOR-XR) 37.5 MG 24 hr capsule Take 37.5 mg by mouth daily.    FAMILY HISTORY:   Her family history includes Alzheimer's disease in her paternal grandfather; Asthma in her father; Breast cancer in her mother and paternal grandmother; Colon cancer in her maternal grandmother; Heart attack in her maternal grandfather. There is no  history of Stomach cancer.  SOCIAL HISTORY:  She  reports that she has never smoked. She has never used smokeless tobacco. She reports current alcohol use of about 1.0 standard drinks of alcohol per week. She reports that she does not use drugs.  REVIEW OF SYSTEMS:    Could not be obtained due to patient altered mental status

## 2019-07-08 NOTE — ED Notes (Signed)
Pt now having bleeding around suprapubic catheter, dr. Alvino Chapel made aware.

## 2019-07-08 NOTE — ED Notes (Signed)
Pt father is Sherren Mocha, please call him [phone] when pt is settled in ICU

## 2019-07-08 NOTE — ED Notes (Signed)
[phone] Cars[place]ra pts care taker, wants an update on pt

## 2019-07-08 NOTE — Telephone Encounter (Signed)
Copied from [place] [id number]. Topic: General - Inquiry >> Jul 07, 2019  3:40 PM Richardo Priest, NT wrote: Reason for CRM: Patient's care giver called in stating she is needing something more for her nausea. States patient is still vomiting and Zofran is not helping. Please advise and call back is [phone].

## 2019-07-08 NOTE — ED Notes (Signed)
ED Provider at bedside to change out king airway. Pupils remain fixed and dilated, non responsive to stimuli, no current sedation. Weak carotid pulse

## 2019-07-08 NOTE — Telephone Encounter (Signed)
Informed patient.  She will keep her appointment scheduled with Dr. Carlota Raspberry on Friday.

## 2019-07-08 NOTE — Telephone Encounter (Signed)
Sent message to [facility] an awaiting response. Dgaddy, CMA

## 2019-07-09 ENCOUNTER — Emergency Department (HOSPITAL_COMMUNITY): Payer: Medicare Other

## 2019-07-09 ENCOUNTER — Inpatient Hospital Stay: Payer: Medicare Other | Admitting: Family Medicine

## 2019-07-09 DIAGNOSIS — E875 Hyperkalemia: Secondary | ICD-10-CM | POA: Diagnosis present

## 2019-07-09 DIAGNOSIS — R64 Cachexia: Secondary | ICD-10-CM | POA: Diagnosis present

## 2019-07-09 DIAGNOSIS — Z20828 Contact with and (suspected) exposure to other viral communicable diseases: Secondary | ICD-10-CM | POA: Diagnosis present

## 2019-07-09 DIAGNOSIS — N179 Acute kidney failure, unspecified: Secondary | ICD-10-CM | POA: Diagnosis present

## 2019-07-09 DIAGNOSIS — E874 Mixed disorder of acid-base balance: Secondary | ICD-10-CM | POA: Diagnosis present

## 2019-07-09 DIAGNOSIS — F429 Obsessive-compulsive disorder, unspecified: Secondary | ICD-10-CM | POA: Diagnosis present

## 2019-07-09 DIAGNOSIS — N319 Neuromuscular dysfunction of bladder, unspecified: Secondary | ICD-10-CM | POA: Diagnosis present

## 2019-07-09 DIAGNOSIS — Z87440 Personal history of urinary (tract) infections: Secondary | ICD-10-CM | POA: Diagnosis not present

## 2019-07-09 DIAGNOSIS — G800 Spastic quadriplegic cerebral palsy: Secondary | ICD-10-CM | POA: Diagnosis present

## 2019-07-09 DIAGNOSIS — I469 Cardiac arrest, cause unspecified: Secondary | ICD-10-CM | POA: Diagnosis present

## 2019-07-09 DIAGNOSIS — K56609 Unspecified intestinal obstruction, unspecified as to partial versus complete obstruction: Secondary | ICD-10-CM | POA: Diagnosis present

## 2019-07-09 DIAGNOSIS — E872 Acidosis, unspecified: Secondary | ICD-10-CM | POA: Insufficient documentation

## 2019-07-09 DIAGNOSIS — R6521 Severe sepsis with septic shock: Secondary | ICD-10-CM | POA: Diagnosis present

## 2019-07-09 DIAGNOSIS — I10 Essential (primary) hypertension: Secondary | ICD-10-CM | POA: Diagnosis present

## 2019-07-09 DIAGNOSIS — Z960 Presence of urogenital implants: Secondary | ICD-10-CM | POA: Diagnosis present

## 2019-07-09 DIAGNOSIS — K5909 Other constipation: Secondary | ICD-10-CM | POA: Diagnosis present

## 2019-07-09 DIAGNOSIS — A419 Sepsis, unspecified organism: Secondary | ICD-10-CM | POA: Diagnosis present

## 2019-07-09 DIAGNOSIS — F329 Major depressive disorder, single episode, unspecified: Secondary | ICD-10-CM | POA: Diagnosis present

## 2019-07-09 DIAGNOSIS — J9601 Acute respiratory failure with hypoxia: Secondary | ICD-10-CM | POA: Diagnosis present

## 2019-07-09 DIAGNOSIS — G40909 Epilepsy, unspecified, not intractable, without status epilepticus: Secondary | ICD-10-CM | POA: Diagnosis present

## 2019-07-09 DIAGNOSIS — M79A3 Nontraumatic compartment syndrome of abdomen: Secondary | ICD-10-CM | POA: Insufficient documentation

## 2019-07-09 DIAGNOSIS — Z79899 Other long term (current) drug therapy: Secondary | ICD-10-CM | POA: Diagnosis not present

## 2019-07-09 DIAGNOSIS — R1930 Abdominal rigidity, unspecified site: Secondary | ICD-10-CM | POA: Diagnosis present

## 2019-07-09 DIAGNOSIS — Z885 Allergy status to narcotic agent status: Secondary | ICD-10-CM | POA: Diagnosis not present

## 2019-07-09 DIAGNOSIS — G931 Anoxic brain damage, not elsewhere classified: Secondary | ICD-10-CM | POA: Diagnosis present

## 2019-07-09 DIAGNOSIS — Z86711 Personal history of pulmonary embolism: Secondary | ICD-10-CM | POA: Diagnosis not present

## 2019-07-09 LAB — POCT I-STAT 7, (LYTES, BLD GAS, ICA,H+H)
Acid-Base Excess: 24 mmol/L — ABNORMAL HIGH (ref 0.0–2.0)
Acid-base deficit: 23 mmol/L — ABNORMAL HIGH (ref 0.0–2.0)
Bicarbonate: 7.9 mmol/L — ABNORMAL LOW (ref 20.0–28.0)
Bicarbonate: 51.9 mmol/L — ABNORMAL HIGH (ref 20.0–28.0)
Calcium, Ion: 1.07 mmol/L — ABNORMAL LOW (ref 1.15–1.40)
Calcium, Ion: 0.76 mmol/L — CL (ref 1.15–1.40)
HCT: 36 % (ref 36.0–46.0)
HCT: 18 % — ABNORMAL LOW (ref 36.0–46.0)
Hemoglobin: 12.2 g/dL (ref 12.0–15.0)
Hemoglobin: 6.1 g/dL — CL (ref 12.0–15.0)
O2 Saturation: 78 %
O2 Saturation: 83 %
Patient temperature: 94
Patient temperature: 31.4
Potassium: 4.9 mmol/L (ref 3.5–5.1)
Potassium: 4.2 mmol/L (ref 3.5–5.1)
Sodium: 146 mmol/L — ABNORMAL HIGH (ref 135–145)
Sodium: 161 mmol/L (ref 135–145)
TCO2: 9 mmol/L — ABNORMAL LOW (ref 22–32)
TCO2: >50 mmol/L — ABNORMAL HIGH (ref 22–32)
pCO2 arterial: 32.1 mmHg (ref 32.0–48.0)
pCO2 arterial: 70.3 mmHg (ref 32.0–48.0)
pH, Arterial: 6.983 — CL (ref 7.350–7.450)
pH, Arterial: 7.451 — ABNORMAL HIGH (ref 7.350–7.450)
pO2, Arterial: 56 mmHg — ABNORMAL LOW (ref 83.0–108.0)
pO2, Arterial: 36 mmHg — CL (ref 83.0–108.0)

## 2019-07-09 LAB — CBC WITH DIFFERENTIAL/PLATELET
Abs Immature Granulocytes: 3.89 10*3/uL — ABNORMAL HIGH (ref 0.00–0.07)
Basophils Absolute: 0.1 10*3/uL (ref 0.0–0.1)
Basophils Relative: 0 %
Eosinophils Absolute: 0.4 10*3/uL (ref 0.0–0.5)
Eosinophils Relative: 1 %
HCT: 38.2 % (ref 36.0–46.0)
Hemoglobin: 11.6 g/dL — ABNORMAL LOW (ref 12.0–15.0)
Immature Granulocytes: 10 %
Lymphocytes Relative: 7 %
Lymphs Abs: 2.6 10*3/uL (ref 0.7–4.0)
MCH: 30.8 pg (ref 26.0–34.0)
MCHC: 30.4 g/dL (ref 30.0–36.0)
MCV: 101.3 fL — ABNORMAL HIGH (ref 80.0–100.0)
Monocytes Absolute: 1 10*3/uL (ref 0.1–1.0)
Monocytes Relative: 2 %
Neutro Abs: 32.5 10*3/uL — ABNORMAL HIGH (ref 1.7–7.7)
Neutrophils Relative %: 80 %
Platelets: 602 10*3/uL — ABNORMAL HIGH (ref 150–400)
RBC: 3.77 MIL/uL — ABNORMAL LOW (ref 3.87–5.11)
RDW: 14 % (ref 11.5–15.5)
WBC: 40.4 10*3/uL — ABNORMAL HIGH (ref 4.0–10.5)
nRBC: 0.1 % (ref 0.0–0.2)

## 2019-07-09 LAB — COMPREHENSIVE METABOLIC PANEL
ALT: 212 U/L — ABNORMAL HIGH (ref 0–44)
AST: 272 U/L — ABNORMAL HIGH (ref 15–41)
Albumin: 2.6 g/dL — ABNORMAL LOW (ref 3.5–5.0)
Alkaline Phosphatase: 195 U/L — ABNORMAL HIGH (ref 38–126)
Anion gap: 29 — ABNORMAL HIGH (ref 5–15)
BUN: 30 mg/dL — ABNORMAL HIGH (ref 6–20)
CO2: 12 mmol/L — ABNORMAL LOW (ref 22–32)
Calcium: 9 mg/dL (ref 8.9–10.3)
Chloride: 104 mmol/L (ref 98–111)
Creatinine, Ser: 2.58 mg/dL — ABNORMAL HIGH (ref 0.44–1.00)
GFR calc Af Amer: 25 mL/min — ABNORMAL LOW (ref >60)
GFR calc non Af Amer: 22 mL/min — ABNORMAL LOW (ref >60)
Glucose, Bld: 113 mg/dL — ABNORMAL HIGH (ref 70–99)
Potassium: 5 mmol/L (ref 3.5–5.1)
Sodium: 145 mmol/L (ref 135–145)
Total Bilirubin: 0.7 mg/dL (ref 0.3–1.2)
Total Protein: 5.7 g/dL — ABNORMAL LOW (ref 6.5–8.1)

## 2019-07-09 LAB — GLUCOSE, CAPILLARY: Glucose-Capillary: 187 mg/dL — ABNORMAL HIGH (ref 70–99)

## 2019-07-09 LAB — LACTIC ACID, PLASMA
Lactic Acid, Venous: >11 mmol/L (ref 0.5–1.9)
Lactic Acid, Venous: >11 mmol/L (ref 0.5–1.9)

## 2019-07-09 LAB — SARS CORONAVIRUS 2 BY RT PCR (HOSPITAL ORDER, PERFORMED IN ~~LOC~~ HOSPITAL LAB): SARS Coronavirus 2: NEGATIVE

## 2019-07-09 MED ORDER — SODIUM BICARBONATE 8.4 % IV SOLN
100.0000 meq | Freq: Once | INTRAVENOUS | Status: AC
  Administered 2019-07-08: 22:00:00 100 meq via INTRAVENOUS

## 2019-07-09 MED ORDER — FENTANYL CITRATE (PF) 100 MCG/2ML IJ SOLN: 50.0000 ug | INTRAMUSCULAR | Status: DC | PRN

## 2019-07-09 MED ORDER — SODIUM BICARBONATE 8.4 % IV SOLN: 150.0000 meq | INTRAVENOUS | Status: AC

## 2019-07-09 MED ORDER — PROPOFOL 1000 MG/100ML IV EMUL: 0.0000 ug/kg/min | INTRAVENOUS | Status: DC

## 2019-07-09 MED ORDER — SODIUM BICARBONATE 8.4 % IV SOLN
INTRAVENOUS | Status: AC
  Filled 2019-07-09: qty 150

## 2019-07-09 MED ORDER — VASOPRESSIN 20 UNIT/ML IV SOLN
0.0300 [IU]/min | INTRAVENOUS | Status: DC
  Filled 2019-07-09: qty 2

## 2019-07-09 MED ORDER — SODIUM BICARBONATE 8.4 % IV SOLN: 150.0000 meq | Freq: Once | INTRAVENOUS | Status: AC

## 2019-07-09 MED ORDER — MIDAZOLAM HCL 2 MG/2ML IJ SOLN: 2.0000 mg | INTRAMUSCULAR | Status: DC | PRN

## 2019-07-09 MED ORDER — PIPERACILLIN-TAZOBACTAM 3.375 G IVPB 30 MIN
3.3750 g | Freq: Once | INTRAVENOUS | Status: AC
  Administered 2019-07-09: 01:00:00 3.375 g via INTRAVENOUS
  Filled 2019-07-09: qty 50

## 2019-07-09 MED ORDER — SODIUM BICARBONATE 8.4 % IV SOLN
50.0000 meq | Freq: Once | INTRAVENOUS | Status: AC
  Administered 2019-07-08: 22:00:00 50 meq via INTRAVENOUS

## 2019-07-09 MED ORDER — FAMOTIDINE IN NACL 20-0.9 MG/50ML-% IV SOLN
20.0000 mg | Freq: Two times a day (BID) | INTRAVENOUS | Status: DC
  Administered 2019-07-09: 20 mg via INTRAVENOUS
  Filled 2019-07-09: qty 50

## 2019-07-09 MED ORDER — SODIUM BICARBONATE 8.4 % IV SOLN
INTRAVENOUS | Status: DC
  Administered 2019-07-09: 02:00:00 via INTRAVENOUS
  Filled 2019-07-09: qty 150

## 2019-07-09 MED ORDER — PIPERACILLIN-TAZOBACTAM 3.375 G IVPB: 3.3750 g | Freq: Three times a day (TID) | INTRAVENOUS | Status: DC

## 2019-07-09 MED ORDER — NOREPINEPHRINE 4 MG/250ML-% IV SOLN
0.0000 ug/min | INTRAVENOUS | Status: DC
  Administered 2019-07-08: 10 ug/min via INTRAVENOUS
  Administered 2019-07-09: 20 ug/min via INTRAVENOUS
  Filled 2019-07-09: qty 250

## 2019-07-09 MED FILL — Medication: Qty: 1 | Status: AC

## 2019-07-13 ENCOUNTER — Telehealth: Payer: Self-pay | Admitting: Family Medicine

## 2019-07-13 ENCOUNTER — Encounter (HOSPITAL_BASED_OUTPATIENT_CLINIC_OR_DEPARTMENT_OTHER): Payer: Medicare Other

## 2019-07-13 LAB — CULTURE, BLOOD (ROUTINE X 2)
Culture: NO GROWTH
Special Requests: ADEQUATE

## 2019-07-13 NOTE — Telephone Encounter (Signed)
Funeral home dropped off Death Certificate to be signed by Dr.Stallings / put in Provider Box at nurses station FR

## 2019-07-14 ENCOUNTER — Telehealth: Payer: Self-pay

## 2019-07-14 LAB — CULTURE, BLOOD (ROUTINE X 2)
Culture: NO GROWTH
Special Requests: ADEQUATE

## 2019-07-14 NOTE — Telephone Encounter (Signed)
Spoke with funeral home [employer], says someone will be bk to pick up certificate. It has been put in the pick up box Clerical has been notified of this

## 2019-07-14 NOTE — Telephone Encounter (Signed)
The death certificate for [name] will be located at the front office in the pick box for the rep from the funeral home to pick up. Message sent to clerical pool

## 2019-07-14 NOTE — Telephone Encounter (Signed)
Funeral home should take the death certificate to the hospital.  She was admitted and the death note is on file.

## 2019-07-21 ENCOUNTER — Ambulatory Visit: Payer: Medicare Other | Admitting: Family Medicine

## 2019-07-23 NOTE — Telephone Encounter (Signed)
Call to funeral home to confirm that death certificate to be handled by hospital staff.  They state to discard the certificate at the clinic.  They will notify hospital.

## 2019-07-23 NOTE — Telephone Encounter (Signed)
Called funeral home, hospital has to fill out death certificate

## 2019-08-03 NOTE — Progress Notes (Signed)
RT obtained ABG from pt with the following results and changes per [facility] MD. RT reported critical PCO2 of 70.3 and PaO2 36 to [facility] MD. PEEP increased to 16 and pt mode changed to PCV per MD. RT will continue to monitor.    Results for KEYNA, BLIZARD (MRN [id number]) as of 23-Jul-2019 02:52  Ref. Range 07-23-2019 02:20  Sample type Unknown ARTERIAL  pH, Arterial Latest Ref Range: 7.350 - 7.450  7.451 (H)  pCO2 arterial Latest Ref Range: 32.0 - 48.0 mmHg 70.3 (HH)  pO2, Arterial Latest Ref Range: 83.0 - 108.0 mmHg 36.0 (LL)  TCO2 Latest Ref Range: 22 - 32 mmol/L >50 (H)  Acid-Base Excess Latest Ref Range: 0.0 - 2.0 mmol/L 24.0 (H)  Bicarbonate Latest Ref Range: 20.0 - 28.0 mmol/L 51.9 (H)  O2 Saturation Latest Units: % 83.0  Patient temperature Unknown 31.4 C

## 2019-08-03 NOTE — ED Notes (Signed)
Pt in ct 

## 2019-08-03 NOTE — Progress Notes (Signed)
I was called to assess the patient due to her not moving good air on the ventilator.  Patient also has worsening metabolic acidosis. I discussed with the nurses and respiratory therapy about the plan of care.  Patient does need disimpaction as per general surgery recommendation and this will  reduce her intra-abdominal pressure which is contributing to her abdominal wall rigidity.  Patient also needs her NG tube reinserted and connected to suction to decompress the stomach.. I also ordered 3 ampoules of sodium bicarb pushes. I will increase PEEP to 14. Patient has grim prognosis.

## 2019-08-03 NOTE — ED Notes (Signed)
On further review, per dr. Elwyn Reach, NG needs to be replaced. Discussed with him 2 Rn's attempted OG with various sizes, unable to advance, tried NG several different times, unable to advance. He acknowledged the same.

## 2019-08-03 NOTE — Death Summary Note (Signed)
I came at bedside patient lost her pulse after becoming bradycardic she is in PEA arrest with a very wide QRS complexes with heart rate in the 20s but only electrical activities despite being on Levophed drip and 100% oxygen on mechanical ventilation she remains very hypoxic.   Patient has very stiff abdominal and chest wall, not able to ventilate and not able to do chest compressions.  Patient remained with very elevated lactic acid of more than 11 despite resuscitation And CT scan of the head showing severe anoxic brain injury despite just being couple of hours after the arrest.  Patient condition is futile and CODE BLUE was terminated after 1 round of epinephrine pulse was rechecked and there was no pulse on Doppler with junctional rhythm on the monitor heart rate of 15.  Patient was pronounced dead at 3:22 AM.  Family were informed and are underway.

## 2019-08-03 NOTE — Progress Notes (Signed)
Attempted to disimpact pt per MD order. Moderate amount of soft greenish/grey stool retrieved from rectum. Pt seems to large amount of stool remaining in rectum MD notified.

## 2019-08-03 NOTE — Progress Notes (Signed)
On- call ME contacted, pt not ME case. OK to discontinue all tubes and lines per [employer].

## 2019-08-03 NOTE — ED Notes (Signed)
NG advanced 10 cm & okay to use central line per Elwyn Reach, MD

## 2019-08-03 NOTE — Progress Notes (Signed)
Pharmacy Antibiotic Note  [name] [name] is a 43 y.o. female admitted on 07/26/2019 with presumed sepsis s/p prolonged cardiac arrest.  Pharmacy has been consulted for Zosyn dosing.  Pt w/ AKI, baseline SCr 0.4 > currently 2.58.  Plan: Zosyn 3.375g IV q8h (4-hour infusion).  Temp (24hrs), Avg:94.2 F (34.6 C), Min:94.2 F (34.6 C), Max:94.2 F (34.6 C)  Recent Labs  Lab 07/02/19 0551 26-Jul-2019 2259 26-Jul-2019 2320  WBC 8.3 PENDING  --   CREATININE 0.40* 2.58*  --   LATICACIDVEN  --   --  >11.0*    Estimated Creatinine Clearance: 21.3 mL/min (A) (by C-G formula based on SCr of 2.58 mg/dL (H)).    Allergies  Allergen Reactions  . Morphine Dermatitis and Other (See Comments)    Skin turned red      Thank you for allowing pharmacy to be a part of this patient's care.  Wynona Neat, PharmD, BCPS  07/04/2019 12:41 AM

## 2019-08-03 DEATH — deceased

## 2019-08-26 ENCOUNTER — Encounter (INDEPENDENT_AMBULATORY_CARE_PROVIDER_SITE_OTHER): Payer: Self-pay | Admitting: Pediatrics

## 2019-09-09 NOTE — Progress Notes (Signed)
Comprehensive eye exam  Imp/Plan: 1. Long-Term/Current use of other high risk medication OU.  Patient on plaquenil-unable to perform an OCT or obtain visual field.  No toxicity noted on clinical exam-monitor in 1 year. 2. Hyperopia OU.  New spec rx to be worn as needed.

## 2019-10-03 NOTE — Progress Notes (Signed)
Entered chart for Code Blue QI data
